# Patient Record
Sex: Female | Born: 1935 | Race: Black or African American | Hispanic: No | Marital: Married | Smoking: Never smoker
Health system: Southern US, Community
[De-identification: ages and names within clinical notes are randomized; demographics above are authoritative.]

## PROBLEM LIST (undated history)

## (undated) DIAGNOSIS — I2510 Atherosclerotic heart disease of native coronary artery without angina pectoris: Secondary | ICD-10-CM

## (undated) DIAGNOSIS — Z9289 Personal history of other medical treatment: Secondary | ICD-10-CM

## (undated) DIAGNOSIS — R0602 Shortness of breath: Secondary | ICD-10-CM

## (undated) DIAGNOSIS — I10 Essential (primary) hypertension: Secondary | ICD-10-CM

## (undated) DIAGNOSIS — E119 Type 2 diabetes mellitus without complications: Secondary | ICD-10-CM

## (undated) DIAGNOSIS — N183 Chronic kidney disease, stage 3 unspecified: Secondary | ICD-10-CM

## (undated) DIAGNOSIS — I209 Angina pectoris, unspecified: Secondary | ICD-10-CM

## (undated) DIAGNOSIS — N179 Acute kidney failure, unspecified: Secondary | ICD-10-CM

## (undated) DIAGNOSIS — J90 Pleural effusion, not elsewhere classified: Secondary | ICD-10-CM

## (undated) DIAGNOSIS — I639 Cerebral infarction, unspecified: Secondary | ICD-10-CM

## (undated) HISTORY — PX: CATARACT EXTRACTION W/ INTRAOCULAR LENS  IMPLANT, BILATERAL: SHX1307

## (undated) HISTORY — DX: Pleural effusion, not elsewhere classified: J90

## (undated) HISTORY — DX: Atherosclerotic heart disease of native coronary artery without angina pectoris: I25.10

## (undated) HISTORY — DX: Essential (primary) hypertension: I10

## (undated) HISTORY — DX: Shortness of breath: R06.02

## (undated) SURGERY — Surgical Case
Anesthesia: *Unknown

---

## 1997-06-26 ENCOUNTER — Other Ambulatory Visit: Admission: RE | Admit: 1997-06-26 | Discharge: 1997-06-26 | Payer: Self-pay | Admitting: Internal Medicine

## 1997-07-17 ENCOUNTER — Encounter: Admission: RE | Admit: 1997-07-17 | Discharge: 1997-10-15 | Payer: Self-pay | Admitting: Internal Medicine

## 2002-01-31 HISTORY — PX: CORONARY ANGIOPLASTY WITH STENT PLACEMENT: SHX49

## 2003-01-02 ENCOUNTER — Inpatient Hospital Stay (HOSPITAL_COMMUNITY): Admission: EM | Admit: 2003-01-02 | Discharge: 2003-01-06 | Payer: Self-pay | Admitting: Emergency Medicine

## 2003-02-03 ENCOUNTER — Encounter (HOSPITAL_COMMUNITY): Admission: RE | Admit: 2003-02-03 | Discharge: 2003-05-04 | Payer: Self-pay | Admitting: Cardiology

## 2003-05-05 ENCOUNTER — Encounter (HOSPITAL_COMMUNITY): Admission: RE | Admit: 2003-05-05 | Discharge: 2003-05-11 | Payer: Self-pay | Admitting: Cardiology

## 2003-05-12 ENCOUNTER — Encounter (HOSPITAL_COMMUNITY): Admission: RE | Admit: 2003-05-12 | Discharge: 2003-08-10 | Payer: Self-pay | Admitting: Cardiology

## 2003-09-02 ENCOUNTER — Encounter (HOSPITAL_COMMUNITY): Admission: RE | Admit: 2003-09-02 | Discharge: 2003-12-01 | Payer: Self-pay | Admitting: Cardiology

## 2003-12-02 ENCOUNTER — Encounter (HOSPITAL_COMMUNITY): Admission: RE | Admit: 2003-12-02 | Discharge: 2004-03-01 | Payer: Self-pay | Admitting: Cardiology

## 2004-03-03 ENCOUNTER — Encounter (HOSPITAL_COMMUNITY): Admission: RE | Admit: 2004-03-03 | Discharge: 2004-06-01 | Payer: Self-pay | Admitting: Cardiology

## 2004-03-08 ENCOUNTER — Ambulatory Visit: Payer: Self-pay | Admitting: Cardiology

## 2004-06-02 ENCOUNTER — Encounter (HOSPITAL_COMMUNITY): Admission: RE | Admit: 2004-06-02 | Discharge: 2004-08-31 | Payer: Self-pay | Admitting: Cardiology

## 2004-09-01 ENCOUNTER — Encounter (HOSPITAL_COMMUNITY): Admission: RE | Admit: 2004-09-01 | Discharge: 2004-11-30 | Payer: Self-pay | Admitting: Cardiology

## 2004-12-01 ENCOUNTER — Encounter (HOSPITAL_COMMUNITY): Admission: RE | Admit: 2004-12-01 | Discharge: 2005-03-01 | Payer: Self-pay | Admitting: Cardiology

## 2005-03-03 ENCOUNTER — Encounter (HOSPITAL_COMMUNITY): Admission: RE | Admit: 2005-03-03 | Discharge: 2005-06-01 | Payer: Self-pay | Admitting: Cardiology

## 2005-05-25 ENCOUNTER — Ambulatory Visit: Payer: Self-pay | Admitting: Cardiology

## 2005-06-02 ENCOUNTER — Encounter (HOSPITAL_COMMUNITY): Admission: RE | Admit: 2005-06-02 | Discharge: 2005-08-31 | Payer: Self-pay | Admitting: Cardiology

## 2005-09-01 ENCOUNTER — Encounter (HOSPITAL_COMMUNITY): Admission: RE | Admit: 2005-09-01 | Discharge: 2005-11-30 | Payer: Self-pay | Admitting: Cardiology

## 2005-12-01 ENCOUNTER — Encounter (HOSPITAL_COMMUNITY): Admission: RE | Admit: 2005-12-01 | Discharge: 2006-03-01 | Payer: Self-pay | Admitting: Cardiology

## 2006-01-13 ENCOUNTER — Ambulatory Visit: Payer: Self-pay | Admitting: Cardiology

## 2006-03-03 ENCOUNTER — Encounter (HOSPITAL_COMMUNITY): Admission: RE | Admit: 2006-03-03 | Discharge: 2006-06-01 | Payer: Self-pay | Admitting: Cardiology

## 2006-06-02 ENCOUNTER — Encounter (HOSPITAL_COMMUNITY): Admission: RE | Admit: 2006-06-02 | Discharge: 2006-08-31 | Payer: Self-pay | Admitting: Cardiology

## 2006-09-01 ENCOUNTER — Encounter (HOSPITAL_COMMUNITY): Admission: RE | Admit: 2006-09-01 | Discharge: 2006-11-30 | Payer: Self-pay | Admitting: Cardiology

## 2006-12-02 ENCOUNTER — Encounter (HOSPITAL_COMMUNITY): Admission: RE | Admit: 2006-12-02 | Discharge: 2007-01-30 | Payer: Self-pay | Admitting: Internal Medicine

## 2007-01-16 ENCOUNTER — Ambulatory Visit: Payer: Self-pay | Admitting: Cardiology

## 2007-01-31 ENCOUNTER — Ambulatory Visit: Payer: Self-pay | Admitting: Cardiology

## 2007-01-31 LAB — CONVERTED CEMR LAB
BUN: 15 mg/dL (ref 6–23)
CO2: 28 meq/L (ref 19–32)
Calcium: 9.8 mg/dL (ref 8.4–10.5)
Chloride: 104 meq/L (ref 96–112)
Creatinine, Ser: 0.9 mg/dL (ref 0.4–1.2)
GFR calc Af Amer: 78 mL/min
GFR calc non Af Amer: 65 mL/min
Glucose, Bld: 337 mg/dL — ABNORMAL HIGH (ref 70–99)
Potassium: 4.1 meq/L (ref 3.5–5.1)
Sodium: 140 meq/L (ref 135–145)

## 2007-02-01 ENCOUNTER — Encounter (HOSPITAL_COMMUNITY): Admission: RE | Admit: 2007-02-01 | Discharge: 2007-04-03 | Payer: Self-pay | Admitting: Cardiology

## 2008-01-02 ENCOUNTER — Ambulatory Visit: Payer: Self-pay | Admitting: Cardiology

## 2009-01-05 DIAGNOSIS — I10 Essential (primary) hypertension: Secondary | ICD-10-CM | POA: Insufficient documentation

## 2009-01-05 DIAGNOSIS — E119 Type 2 diabetes mellitus without complications: Secondary | ICD-10-CM

## 2009-01-05 DIAGNOSIS — I2510 Atherosclerotic heart disease of native coronary artery without angina pectoris: Secondary | ICD-10-CM

## 2009-01-06 ENCOUNTER — Ambulatory Visit: Payer: Self-pay | Admitting: Cardiology

## 2009-01-06 DIAGNOSIS — E785 Hyperlipidemia, unspecified: Secondary | ICD-10-CM

## 2009-01-19 ENCOUNTER — Ambulatory Visit: Payer: Self-pay | Admitting: Cardiology

## 2009-01-19 DIAGNOSIS — I2510 Atherosclerotic heart disease of native coronary artery without angina pectoris: Secondary | ICD-10-CM

## 2009-01-20 LAB — CONVERTED CEMR LAB
BUN: 18 mg/dL (ref 6–23)
CO2: 26 meq/L (ref 19–32)
Calcium: 9.9 mg/dL (ref 8.4–10.5)
Chloride: 104 meq/L (ref 96–112)
Creatinine, Ser: 1 mg/dL (ref 0.4–1.2)
GFR calc non Af Amer: 68.73 mL/min (ref >60)
Glucose, Bld: 286 mg/dL — ABNORMAL HIGH (ref 70–99)
Potassium: 4.6 meq/L (ref 3.5–5.1)
Sodium: 138 meq/L (ref 135–145)

## 2009-01-31 DIAGNOSIS — J90 Pleural effusion, not elsewhere classified: Secondary | ICD-10-CM

## 2009-01-31 HISTORY — PX: CORONARY ARTERY BYPASS GRAFT: SHX141

## 2009-01-31 HISTORY — DX: Pleural effusion, not elsewhere classified: J90

## 2009-12-02 ENCOUNTER — Inpatient Hospital Stay (HOSPITAL_COMMUNITY): Admission: EM | Admit: 2009-12-02 | Discharge: 2009-12-18 | Payer: Self-pay | Admitting: Emergency Medicine

## 2009-12-02 ENCOUNTER — Ambulatory Visit: Payer: Self-pay | Admitting: Cardiology

## 2009-12-03 ENCOUNTER — Ambulatory Visit: Payer: Self-pay | Admitting: Thoracic Surgery (Cardiothoracic Vascular Surgery)

## 2009-12-04 ENCOUNTER — Encounter: Payer: Self-pay | Admitting: Thoracic Surgery (Cardiothoracic Vascular Surgery)

## 2009-12-16 ENCOUNTER — Encounter: Payer: Self-pay | Admitting: Thoracic Surgery (Cardiothoracic Vascular Surgery)

## 2009-12-16 ENCOUNTER — Ambulatory Visit: Payer: Self-pay | Admitting: Vascular Surgery

## 2009-12-29 ENCOUNTER — Encounter: Payer: Self-pay | Admitting: Cardiology

## 2009-12-30 ENCOUNTER — Telehealth: Payer: Self-pay | Admitting: Cardiology

## 2009-12-30 ENCOUNTER — Encounter: Payer: Self-pay | Admitting: Cardiology

## 2009-12-31 ENCOUNTER — Encounter: Payer: Self-pay | Admitting: Cardiology

## 2009-12-31 ENCOUNTER — Telehealth (INDEPENDENT_AMBULATORY_CARE_PROVIDER_SITE_OTHER): Payer: Self-pay | Admitting: *Deleted

## 2010-01-01 ENCOUNTER — Telehealth: Payer: Self-pay | Admitting: Cardiology

## 2010-01-04 ENCOUNTER — Encounter: Payer: Self-pay | Admitting: Cardiology

## 2010-01-04 ENCOUNTER — Encounter: Admission: RE | Admit: 2010-01-04 | Discharge: 2010-01-04 | Payer: Self-pay | Admitting: Cardiothoracic Surgery

## 2010-01-04 ENCOUNTER — Ambulatory Visit: Payer: Self-pay | Admitting: Cardiothoracic Surgery

## 2010-01-18 ENCOUNTER — Encounter
Admission: RE | Admit: 2010-01-18 | Discharge: 2010-01-18 | Payer: Self-pay | Source: Home / Self Care | Attending: Thoracic Surgery (Cardiothoracic Vascular Surgery) | Admitting: Thoracic Surgery (Cardiothoracic Vascular Surgery)

## 2010-01-18 ENCOUNTER — Ambulatory Visit: Payer: Self-pay | Admitting: Thoracic Surgery (Cardiothoracic Vascular Surgery)

## 2010-01-18 ENCOUNTER — Encounter: Payer: Self-pay | Admitting: Cardiology

## 2010-01-26 ENCOUNTER — Encounter: Payer: Self-pay | Admitting: Cardiology

## 2010-01-26 ENCOUNTER — Ambulatory Visit: Payer: Self-pay | Admitting: Cardiology

## 2010-02-08 ENCOUNTER — Encounter: Payer: Self-pay | Admitting: Cardiology

## 2010-03-02 NOTE — Progress Notes (Addendum)
Summary: fax number   Phone Note Call from Patient   Reason for Call: Talk to Nurse Summary of Call: fax# [phone] Attn:Caryn Teamer She is sitting at fax machine waiting Initial call taken by: Shelda Pal,  January 01, 2010 3:58 PM  Follow-up for Phone Call        faxed letter as requested Follow-up by: Sim Boast, RN,  January 01, 2010 4:00 PM

## 2010-03-02 NOTE — Progress Notes (Addendum)
Summary: [facility]   Phone Note Call from Patient Call back at [phone]   Caller: DR.Vandeventer Summary of Call: [place] Initial call taken by: Delsa Sale,  December 30, 2009 11:47 AM  Follow-up for Phone Call        Would like a note to whom it may concern that [name] is the care taker of  this pt.  This will be supplied once I speak with [name] to verify exactly what the letter should say. Follow-up by: Sim Boast, RN,  December 30, 2009 1:42 PM

## 2010-03-02 NOTE — Miscellaneous (Addendum)
  Clinical Lists Changes  Observations: Added new observation of CARDCATHFIND: CONCLUSION:  Severe 3-vessel coronary artery disease with 70% proximal and 90% mid stenosis in the LAD, 90% stenosis in the mid circumflex artery with 80% stenosis in the first marginal and 95% stenosis in the second marginal branch, and total occlusion of the mid-to-distal right coronary with inferior wall hypokinesis and estimated ejection fraction of 40%.  RECOMMENDATIONS:  The patient has severe 3-vessel disease which has progressed in the circumflex and right coronary artery lesions.  I suspect the culprit for this recent non-ST-elevation MI is the second marginal branch.  I think surgery is the best option for revascularization.  Her major comorbidity is her age but she has good renal function and pretty good target vessels.  We will obtain surgical consultation. (12/03/2009 10:26) Added new observation of CT SCAN: Findings:  Both lungs are clear.  There is no axillary,   mediastinal, hilar adenopathy.  There are no filling defects within   either pulmonary arterial system to suggest a segmental or   subsegmental pulmonary embolus.  The thoracic aorta has a normal   caliber and appearance except for a small amount of atherosclerotic   plaque in the transverse aorta.    There is a moderate sized hiatal hernia.  Visualized upper abdomen   is unremarkable.  There is no evidence of acute osseous   abnormality.    Review of the MIP images confirms the above findings.    IMPRESSION:   Moderate sized hiatal hernia.  The exam is otherwise negative.  (12/02/2009 10:27)      Cardiac Cath  Procedure date:  12/03/2009  Findings:      CONCLUSION:  Severe 3-vessel coronary artery disease with 70% proximal and 90% mid stenosis in the LAD, 90% stenosis in the mid circumflex artery with 80% stenosis in the first marginal and 95% stenosis in the second marginal branch, and total occlusion of the  mid-to-distal right coronary with inferior wall hypokinesis and estimated ejection fraction of 40%.  RECOMMENDATIONS:  The patient has severe 3-vessel disease which has progressed in the circumflex and right coronary artery lesions.  I suspect the culprit for this recent non-ST-elevation MI is the second marginal branch.  I think surgery is the best option for revascularization.  Her major comorbidity is her age but she has good renal function and pretty good target vessels.  We will obtain surgical consultation.  CT Scan  Procedure date:  12/02/2009  Findings:      Findings:  Both lungs are clear.  There is no axillary,   mediastinal, hilar adenopathy.  There are no filling defects within   either pulmonary arterial system to suggest a segmental or   subsegmental pulmonary embolus.  The thoracic aorta has a normal   caliber and appearance except for a small amount of atherosclerotic   plaque in the transverse aorta.    There is a moderate sized hiatal hernia.  Visualized upper abdomen   is unremarkable.  There is no evidence of acute osseous   abnormality.    Review of the MIP images confirms the above findings.    IMPRESSION:   Moderate sized hiatal hernia.  The exam is otherwise negative.

## 2010-03-02 NOTE — Progress Notes (Addendum)
   Walk in Patient Form Recieved " pt Daughter needs note for work" sent to [place]  December 31, 2009 8:38 AM

## 2010-03-02 NOTE — Letter (Addendum)
Summary: letterfor daughter as caregiver  Press photographer, [place]  [address] [place]   [place], [place] [address]   Phone: [phone]  Fax: [phone]          December 31, 2009 MRN: [id number]    RE:   [name]     To Whom It May Concern:   Caryn Teamer is the caregiver for her mother.  Her condition started on 12/03/09.  Her dates of incapacity are 12/08/09 through 02/11/10.  As a caregiver she will provide transportation to various doctors appointments and cardiac rehab.  She will also provide basic hygiene, nutritional and physical support, pulmonary/respiratory care and assistance with ambulation until the patient becomes independent.        Sincerely,      Sim Boast, RN for Dr. Minus Breeding  This letter has been electronically signed by your physician.

## 2010-03-04 NOTE — Letter (Addendum)
Summary: [facility] Office Visit Note   [facility] Office Visit Note   Imported By: Sallee Provencal 01/22/2010 11:10:20  _____________________________________________________________________  External Attachment:    Type:   Image     Comment:   External Document

## 2010-03-04 NOTE — Assessment & Plan Note (Addendum)
Summary: 4 month rov.sl  Medications Added AMLODIPINE BESYLATE 10 MG TABS (AMLODIPINE BESYLATE) 1 by mouth daily ASPIRIN 81 MG TABS (ASPIRIN) one daily GLIPIZIDE 5 MG TABS (GLIPIZIDE) 1 by mouth qam LOSARTAN POTASSIUM 100 MG TABS (LOSARTAN POTASSIUM) 1 by mouth daily PRILOSEC 20 MG CPDR (OMEPRAZOLE)  FUROSEMIDE 40 MG TABS (FUROSEMIDE) one daily ULTRAM 50 MG TABS (TRAMADOL HCL) as directed ALPRAZOLAM 0.25 MG TABS (ALPRAZOLAM) as directed      Allergies Added: NKDA  Visit Type:  Follow-up Primary Provider:  Dr. Deland Pretty  CC:  CAD.  History of Present Illness: The patient presents for followup of coronary disease status post CABG. She has done relatively well since her surgery. She did have a somewhat slow recovery and had to go for rehabilitation for a while. She is doing physical therapy at home for now. She think she is improving steadily. She denies any chest discomfort similar to her presenting complaints. She has had no chest pressure, neck or arm discomfort. She has had no palpitations, presyncope or syncope.  he has had no swallowing, weight gain or edema. She had no cough fevers or chills. Of note she did have a significant rash after surgery. This is improved. She's had medications changed by Dr. Ubaldo Glassing.  It is not clear to media pending aging though I do note she was taken off of ACE inhibitors.  Current Medications (verified): 1)  Amlodipine Besylate 10 Mg Tabs (Amlodipine Besylate) .Marland Kitchen.. 1 By Mouth Daily 2)  Lopressor 100 Mg Tabs (Metoprolol Tartrate) .Marland Kitchen.. 1 By Mouth Two Times A Day 3)  Aspirin 325 Mg  Tabs (Aspirin) .Marland Kitchen.. 1 By Mouth Daily 4)  Glucophage 1000 Mg Tabs (Metformin Hcl) .Marland Kitchen.. 1 By Mouth Two Times A Day 5)  Glipizide 5 Mg Tabs (Glipizide) .Marland Kitchen.. 1 By Mouth Qam 6)  Lisinopril 10 Mg Tabs (Lisinopril) .... One Daily 7)  Losartan Potassium 100 Mg Tabs (Losartan Potassium) .Marland Kitchen.. 1 By Mouth Daily  Allergies (verified): No Known Drug Allergies  Past History:  Past  Medical History:  1. Coronary artery disease (Stenting of RCA, CABG)     stented).  2. Mildly reduced EF (45%).   3. Diabetes mellitus.   4. Hypertension.   Past Surgical History: Bilateral cataract surgery CABG ( x5 (left internal mammary artery to  LAD, saphenous vein graft to second diagonal, saphenous vein       graft to obtuse marginal 1, saphenous vein graft to posterior  descending).  Review of Systems       As stated in the HPI and negative for all other systems.   Vital Signs:  Patient profile:   77 year old female Height:      64 inches Weight:      158 pounds BMI:     27.22 Pulse rate:   86 / minute Resp:     16 per minute BP sitting:   152 / 78  (right arm)  Vitals Entered By: Levora Angel, CNA (January 26, 2010 4:05 PM)  Physical Exam  General:  Well developed, well nourished, in no acute distress. Head:  normocephalic and atraumatic Eyes:  PERRLA/EOM intact; conjunctiva and lids normal. Neck:  Neck supple, no JVD. No masses, thyromegaly or abnormal cervical nodes. Chest Wall:  Well-healed sternotomy scar without erythema or exudate Lungs:  Clear bilaterally to auscultation and percussion. Abdomen:  Bowel sounds positive; abdomen soft and non-tender without masses, organomegaly, or hernias noted. No hepatosplenomegaly. Msk:  Back normal, normal gait. Muscle strength and  tone normal. Extremities:  No clubbing or cyanosis. Neurologic:  Alert and oriented x 3. Skin:  Intact without lesions or rashes. Cervical Nodes:  no significant adenopathy Inguinal Nodes:  no significant adenopathy Psych:  Normal affect.   Detailed Cardiovascular Exam  Neck    Carotids: Carotids full and equal bilaterally without bruits.      Neck Veins: Normal, no JVD.    Heart    Inspection: no deformities or lifts noted.      Palpation: normal PMI with no thrills palpable.      Auscultation: regular rate and rhythm, S1, S2 without murmurs, rubs, gallops, or clicks.     Vascular    Abdominal Aorta: no palpable masses, pulsations, or audible bruits.      Femoral Pulses: normal femoral pulses bilaterally.      Pedal Pulses: normal pedal pulses bilaterally.      Radial Pulses: normal radial pulses bilaterally.      Peripheral Circulation: no clubbing, cyanosis, or edema noted with normal capillary refill.     EKG  Procedure date:  01/26/2010  Findings:      Sinus rhythm, rate 88, axis within normal limits, old inferior infarct, lateral infarct, lateral T wave inversions  Impression & Recommendations:  Problem # 1:  CORONARY ATHEROSCLEROSIS NATIVE CORONARY ARTERY (ICD-414.01) The patient is making a slow but steady recovery from surgery. I will wait until the end of the month to suggest cardiac rehabilitation. For now I will continue with meds as listed.  Problem # 2:  HYPERTENSION (ICD-401.9) Her blood pressure is slightly elevated in the office today. However, she reports that it is well controlled typically elsewhere. I will make that change her regimen.  Problem # 3:  DYSLIPIDEMIA (ICD-272.4) We will follow this closely with a goal LDL less than 70 and HDL greater than 50.  Other Orders: EKG w/ Interpretation (93000)  Patient Instructions: 1)  Your physician recommends that you schedule a follow-up appointment in: 2 months with Dr Percival Spanish 2)  Your physician has recommended you make the following change in your medication: decrease Asa to 81 mg once a day

## 2010-03-04 NOTE — Letter (Addendum)
Summary: [facility] - Walk-In Patient Form  [facility] - Walk-In Patient Form   Imported By: Marilynne Drivers 02/08/2010 14:46:30  _____________________________________________________________________  External Attachment:    Type:   Image     Comment:   External Document

## 2010-03-04 NOTE — Letter (Addendum)
Summary: [facility]  [facility]   Imported By: Marilynne Drivers 02/04/2010 15:08:53  _____________________________________________________________________  External Attachment:    Type:   Image     Comment:   External Document

## 2010-03-04 NOTE — Miscellaneous (Signed)
Summary: [facility] Physician Order/Treatment Plan   [facility] Physician Order/Treatment Plan   Imported By: Sallee Provencal 02/22/2010 15:44:07  _____________________________________________________________________  External Attachment:    Type:   Image     Comment:   External Document

## 2010-03-05 ENCOUNTER — Encounter: Payer: Self-pay | Admitting: Cardiology

## 2010-03-11 ENCOUNTER — Telehealth (INDEPENDENT_AMBULATORY_CARE_PROVIDER_SITE_OTHER): Payer: Self-pay | Admitting: *Deleted

## 2010-03-15 ENCOUNTER — Encounter (HOSPITAL_COMMUNITY): Payer: Medicare Other | Attending: Cardiology

## 2010-03-15 ENCOUNTER — Other Ambulatory Visit: Payer: Self-pay

## 2010-03-15 DIAGNOSIS — E785 Hyperlipidemia, unspecified: Secondary | ICD-10-CM | POA: Insufficient documentation

## 2010-03-15 DIAGNOSIS — Z5189 Encounter for other specified aftercare: Secondary | ICD-10-CM | POA: Insufficient documentation

## 2010-03-15 DIAGNOSIS — I10 Essential (primary) hypertension: Secondary | ICD-10-CM | POA: Insufficient documentation

## 2010-03-15 DIAGNOSIS — I2510 Atherosclerotic heart disease of native coronary artery without angina pectoris: Secondary | ICD-10-CM | POA: Insufficient documentation

## 2010-03-15 DIAGNOSIS — E119 Type 2 diabetes mellitus without complications: Secondary | ICD-10-CM | POA: Insufficient documentation

## 2010-03-15 LAB — GLUCOSE, CAPILLARY
Glucose-Capillary: 293 mg/dL — ABNORMAL HIGH (ref 70–99)
Glucose-Capillary: 274 mg/dL — ABNORMAL HIGH (ref 70–99)

## 2010-03-17 ENCOUNTER — Encounter (HOSPITAL_COMMUNITY): Payer: Medicare Other

## 2010-03-17 ENCOUNTER — Other Ambulatory Visit: Payer: Self-pay

## 2010-03-17 ENCOUNTER — Encounter (INDEPENDENT_AMBULATORY_CARE_PROVIDER_SITE_OTHER): Payer: Self-pay | Admitting: *Deleted

## 2010-03-17 LAB — GLUCOSE, CAPILLARY
Glucose-Capillary: 186 mg/dL — ABNORMAL HIGH (ref 70–99)
Glucose-Capillary: 166 mg/dL — ABNORMAL HIGH (ref 70–99)

## 2010-03-18 NOTE — Progress Notes (Addendum)
Summary: Records Request   Faxed OV & EKG to [name] ([id number]).  [name]  March 11, 2010 12:25 PM

## 2010-03-19 ENCOUNTER — Other Ambulatory Visit: Payer: Self-pay

## 2010-03-19 ENCOUNTER — Encounter (HOSPITAL_COMMUNITY): Payer: Medicare Other

## 2010-03-22 ENCOUNTER — Encounter (HOSPITAL_COMMUNITY): Payer: Medicare Other

## 2010-03-22 LAB — GLUCOSE, CAPILLARY
Glucose-Capillary: 261 mg/dL — ABNORMAL HIGH (ref 70–99)
Glucose-Capillary: 203 mg/dL — ABNORMAL HIGH (ref 70–99)

## 2010-03-24 ENCOUNTER — Other Ambulatory Visit: Payer: Self-pay

## 2010-03-24 ENCOUNTER — Encounter (HOSPITAL_COMMUNITY): Payer: Medicare Other

## 2010-03-24 LAB — GLUCOSE, CAPILLARY
Glucose-Capillary: 204 mg/dL — ABNORMAL HIGH (ref 70–99)
Glucose-Capillary: 172 mg/dL — ABNORMAL HIGH (ref 70–99)

## 2010-03-24 NOTE — Letter (Signed)
Summary: Appointment - [place], [place]  [address] [place]   [place], [place] [address]   Phone: [phone]  Fax: [phone]     March 17, 2010 MRN: [id number]   [place] [address] [place], [place]  [address]   Dear [name],   Due to a change in our office schedule, your appointment on  03-30-10  at   10:00am.             must be changed.  It is very important that we reach you to reschedule this appointment. We look forward to participating in your health care needs. Please contact us at the number listed above at your earliest convenience to reschedule this appointment.     Sincerely,  Public relations account executive

## 2010-03-26 ENCOUNTER — Other Ambulatory Visit: Payer: Self-pay

## 2010-03-26 ENCOUNTER — Encounter: Payer: Self-pay | Admitting: Cardiology

## 2010-03-26 ENCOUNTER — Encounter (HOSPITAL_COMMUNITY): Payer: Medicare Other

## 2010-03-29 ENCOUNTER — Encounter (HOSPITAL_COMMUNITY): Payer: Medicare Other

## 2010-03-30 ENCOUNTER — Ambulatory Visit: Payer: Self-pay | Admitting: Cardiology

## 2010-03-31 ENCOUNTER — Encounter (HOSPITAL_COMMUNITY): Payer: Medicare Other

## 2010-04-02 ENCOUNTER — Encounter (HOSPITAL_COMMUNITY): Payer: Medicare Other

## 2010-04-05 ENCOUNTER — Encounter (HOSPITAL_COMMUNITY): Payer: Medicare Other | Attending: Cardiology

## 2010-04-05 ENCOUNTER — Other Ambulatory Visit: Payer: Self-pay

## 2010-04-05 DIAGNOSIS — I10 Essential (primary) hypertension: Secondary | ICD-10-CM | POA: Insufficient documentation

## 2010-04-05 DIAGNOSIS — Z5189 Encounter for other specified aftercare: Secondary | ICD-10-CM | POA: Insufficient documentation

## 2010-04-05 DIAGNOSIS — E785 Hyperlipidemia, unspecified: Secondary | ICD-10-CM | POA: Insufficient documentation

## 2010-04-05 DIAGNOSIS — I2510 Atherosclerotic heart disease of native coronary artery without angina pectoris: Secondary | ICD-10-CM | POA: Insufficient documentation

## 2010-04-05 DIAGNOSIS — E119 Type 2 diabetes mellitus without complications: Secondary | ICD-10-CM | POA: Insufficient documentation

## 2010-04-07 ENCOUNTER — Encounter (HOSPITAL_COMMUNITY): Payer: Medicare Other

## 2010-04-07 ENCOUNTER — Other Ambulatory Visit: Payer: Self-pay

## 2010-04-09 ENCOUNTER — Other Ambulatory Visit: Payer: Self-pay

## 2010-04-09 ENCOUNTER — Encounter (HOSPITAL_COMMUNITY): Payer: Medicare Other

## 2010-04-09 LAB — GLUCOSE, CAPILLARY
Glucose-Capillary: 177 mg/dL — ABNORMAL HIGH (ref 70–99)
Glucose-Capillary: 179 mg/dL — ABNORMAL HIGH (ref 70–99)

## 2010-04-12 ENCOUNTER — Encounter (HOSPITAL_COMMUNITY): Payer: Medicare Other

## 2010-04-13 LAB — BASIC METABOLIC PANEL
BUN: 11 mg/dL (ref 6–23)
BUN: 11 mg/dL (ref 6–23)
BUN: 14 mg/dL (ref 6–23)
BUN: 17 mg/dL (ref 6–23)
CO2: 22 mEq/L (ref 19–32)
CO2: 26 mEq/L (ref 19–32)
CO2: 27 mEq/L (ref 19–32)
CO2: 27 mEq/L (ref 19–32)
CO2: 27 mEq/L (ref 19–32)
Calcium: 9 mg/dL (ref 8.4–10.5)
Calcium: 9.3 mg/dL (ref 8.4–10.5)
Calcium: 9.5 mg/dL (ref 8.4–10.5)
Calcium: 9.5 mg/dL (ref 8.4–10.5)
Chloride: 102 mEq/L (ref 96–112)
Chloride: 105 mEq/L (ref 96–112)
Chloride: 106 mEq/L (ref 96–112)
Chloride: 109 mEq/L (ref 96–112)
Chloride: 109 mEq/L (ref 96–112)
Chloride: 113 mEq/L — ABNORMAL HIGH (ref 96–112)
Chloride: 118 mEq/L — ABNORMAL HIGH (ref 96–112)
Creatinine, Ser: 0.92 mg/dL (ref 0.4–1.2)
Creatinine, Ser: 0.96 mg/dL (ref 0.4–1.2)
Creatinine, Ser: 1.03 mg/dL (ref 0.4–1.2)
Creatinine, Ser: 1.04 mg/dL (ref 0.4–1.2)
Creatinine, Ser: 1.08 mg/dL (ref 0.4–1.2)
Creatinine, Ser: 1.13 mg/dL (ref 0.4–1.2)
GFR calc Af Amer: 55 mL/min — ABNORMAL LOW (ref >60)
GFR calc Af Amer: 56 mL/min — ABNORMAL LOW (ref >60)
GFR calc Af Amer: >60 mL/min (ref >60)
GFR calc Af Amer: >60 mL/min (ref >60)
GFR calc Af Amer: >60 mL/min (ref >60)
GFR calc non Af Amer: 45 mL/min — ABNORMAL LOW (ref >60)
GFR calc non Af Amer: 53 mL/min — ABNORMAL LOW (ref >60)
GFR calc non Af Amer: 55 mL/min — ABNORMAL LOW (ref >60)
GFR calc non Af Amer: 56 mL/min — ABNORMAL LOW (ref >60)
GFR calc non Af Amer: 56 mL/min — ABNORMAL LOW (ref >60)
Glucose, Bld: 122 mg/dL — ABNORMAL HIGH (ref 70–99)
Glucose, Bld: 142 mg/dL — ABNORMAL HIGH (ref 70–99)
Glucose, Bld: 172 mg/dL — ABNORMAL HIGH (ref 70–99)
Glucose, Bld: 338 mg/dL — ABNORMAL HIGH (ref 70–99)
Glucose, Bld: 50 mg/dL — ABNORMAL LOW (ref 70–99)
Glucose, Bld: 86 mg/dL (ref 70–99)
Potassium: 3.7 mEq/L (ref 3.5–5.1)
Potassium: 3.8 mEq/L (ref 3.5–5.1)
Potassium: 3.8 mEq/L (ref 3.5–5.1)
Potassium: 4 mEq/L (ref 3.5–5.1)
Potassium: 4.1 mEq/L (ref 3.5–5.1)
Sodium: 138 mEq/L (ref 135–145)
Sodium: 139 mEq/L (ref 135–145)
Sodium: 140 mEq/L (ref 135–145)
Sodium: 140 mEq/L (ref 135–145)
Sodium: 144 mEq/L (ref 135–145)

## 2010-04-13 LAB — CBC
HCT: 29.2 % — ABNORMAL LOW (ref 36.0–46.0)
HCT: 29.3 % — ABNORMAL LOW (ref 36.0–46.0)
HCT: 30.1 % — ABNORMAL LOW (ref 36.0–46.0)
HCT: 31.5 % — ABNORMAL LOW (ref 36.0–46.0)
HCT: 32.4 % — ABNORMAL LOW (ref 36.0–46.0)
HCT: 36.7 % (ref 36.0–46.0)
HCT: 37.1 % (ref 36.0–46.0)
HCT: 40.2 % (ref 36.0–46.0)
Hemoglobin: 10.1 g/dL — ABNORMAL LOW (ref 12.0–15.0)
Hemoglobin: 10.4 g/dL — ABNORMAL LOW (ref 12.0–15.0)
Hemoglobin: 10.4 g/dL — ABNORMAL LOW (ref 12.0–15.0)
Hemoglobin: 10.5 g/dL — ABNORMAL LOW (ref 12.0–15.0)
Hemoglobin: 11.9 g/dL — ABNORMAL LOW (ref 12.0–15.0)
Hemoglobin: 12 g/dL (ref 12.0–15.0)
Hemoglobin: 12.2 g/dL (ref 12.0–15.0)
Hemoglobin: 13.5 g/dL (ref 12.0–15.0)
Hemoglobin: 9.6 g/dL — ABNORMAL LOW (ref 12.0–15.0)
Hemoglobin: 9.7 g/dL — ABNORMAL LOW (ref 12.0–15.0)
MCH: 28 pg (ref 26.0–34.0)
MCH: 29.1 pg (ref 26.0–34.0)
MCH: 29.5 pg (ref 26.0–34.0)
MCH: 29.7 pg (ref 26.0–34.0)
MCH: 29.8 pg (ref 26.0–34.0)
MCH: 29.9 pg (ref 26.0–34.0)
MCH: 30 pg (ref 26.0–34.0)
MCH: 30.1 pg (ref 26.0–34.0)
MCH: 30.3 pg (ref 26.0–34.0)
MCHC: 32.3 g/dL (ref 30.0–36.0)
MCHC: 32.4 g/dL (ref 30.0–36.0)
MCHC: 33.1 g/dL (ref 30.0–36.0)
MCHC: 33.1 g/dL (ref 30.0–36.0)
MCHC: 33.3 g/dL (ref 30.0–36.0)
MCHC: 33.6 g/dL (ref 30.0–36.0)
MCV: 88 fL (ref 78.0–100.0)
MCV: 89.1 fL (ref 78.0–100.0)
MCV: 90.3 fL (ref 78.0–100.0)
MCV: 91.3 fL (ref 78.0–100.0)
MCV: 92.9 fL (ref 78.0–100.0)
Platelets: 101 10*3/uL — ABNORMAL LOW (ref 150–400)
Platelets: 130 10*3/uL — ABNORMAL LOW (ref 150–400)
Platelets: 130 10*3/uL — ABNORMAL LOW (ref 150–400)
Platelets: 155 10*3/uL (ref 150–400)
Platelets: 212 10*3/uL (ref 150–400)
Platelets: 234 10*3/uL (ref 150–400)
Platelets: 239 10*3/uL (ref 150–400)
Platelets: 250 10*3/uL (ref 150–400)
Platelets: 262 10*3/uL (ref 150–400)
RBC: 3.33 MIL/uL — ABNORMAL LOW (ref 3.87–5.11)
RBC: 3.38 MIL/uL — ABNORMAL LOW (ref 3.87–5.11)
RBC: 3.47 MIL/uL — ABNORMAL LOW (ref 3.87–5.11)
RBC: 3.53 MIL/uL — ABNORMAL LOW (ref 3.87–5.11)
RBC: 3.54 MIL/uL — ABNORMAL LOW (ref 3.87–5.11)
RBC: 4.04 MIL/uL (ref 3.87–5.11)
RBC: 4.09 MIL/uL (ref 3.87–5.11)
RBC: 4.14 MIL/uL (ref 3.87–5.11)
RBC: 4.25 MIL/uL (ref 3.87–5.11)
RDW: 13.7 % (ref 11.5–15.5)
RDW: 14.2 % (ref 11.5–15.5)
RDW: 14.2 % (ref 11.5–15.5)
RDW: 14.9 % (ref 11.5–15.5)
RDW: 15.2 % (ref 11.5–15.5)
WBC: 5.5 10*3/uL (ref 4.0–10.5)
WBC: 5.6 10*3/uL (ref 4.0–10.5)
WBC: 5.6 10*3/uL (ref 4.0–10.5)
WBC: 5.9 10*3/uL (ref 4.0–10.5)
WBC: 7.8 10*3/uL (ref 4.0–10.5)
WBC: 9 10*3/uL (ref 4.0–10.5)

## 2010-04-13 LAB — POCT I-STAT 4, (NA,K, GLUC, HGB,HCT)
Glucose, Bld: 125 mg/dL — ABNORMAL HIGH (ref 70–99)
Glucose, Bld: 140 mg/dL — ABNORMAL HIGH (ref 70–99)
Glucose, Bld: 156 mg/dL — ABNORMAL HIGH (ref 70–99)
Glucose, Bld: 169 mg/dL — ABNORMAL HIGH (ref 70–99)
HCT: 20 % — ABNORMAL LOW (ref 36.0–46.0)
HCT: 21 % — ABNORMAL LOW (ref 36.0–46.0)
HCT: 27 % — ABNORMAL LOW (ref 36.0–46.0)
HCT: 29 % — ABNORMAL LOW (ref 36.0–46.0)
Hemoglobin: 7.1 g/dL — ABNORMAL LOW (ref 12.0–15.0)
Hemoglobin: 9.2 g/dL — ABNORMAL LOW (ref 12.0–15.0)
Potassium: 3.7 mEq/L (ref 3.5–5.1)
Potassium: 4.4 mEq/L (ref 3.5–5.1)
Sodium: 138 mEq/L (ref 135–145)
Sodium: 142 mEq/L (ref 135–145)
Sodium: 143 mEq/L (ref 135–145)

## 2010-04-13 LAB — CK TOTAL AND CKMB (NOT AT ARMC)
CK, MB: 6.6 ng/mL (ref 0.3–4.0)
Relative Index: 5.7 — ABNORMAL HIGH (ref 0.0–2.5)
Total CK: 115 U/L (ref 7–177)

## 2010-04-13 LAB — GLUCOSE, CAPILLARY
Glucose-Capillary: 100 mg/dL — ABNORMAL HIGH (ref 70–99)
Glucose-Capillary: 100 mg/dL — ABNORMAL HIGH (ref 70–99)
Glucose-Capillary: 103 mg/dL — ABNORMAL HIGH (ref 70–99)
Glucose-Capillary: 104 mg/dL — ABNORMAL HIGH (ref 70–99)
Glucose-Capillary: 109 mg/dL — ABNORMAL HIGH (ref 70–99)
Glucose-Capillary: 116 mg/dL — ABNORMAL HIGH (ref 70–99)
Glucose-Capillary: 117 mg/dL — ABNORMAL HIGH (ref 70–99)
Glucose-Capillary: 121 mg/dL — ABNORMAL HIGH (ref 70–99)
Glucose-Capillary: 142 mg/dL — ABNORMAL HIGH (ref 70–99)
Glucose-Capillary: 143 mg/dL — ABNORMAL HIGH (ref 70–99)
Glucose-Capillary: 151 mg/dL — ABNORMAL HIGH (ref 70–99)
Glucose-Capillary: 163 mg/dL — ABNORMAL HIGH (ref 70–99)
Glucose-Capillary: 163 mg/dL — ABNORMAL HIGH (ref 70–99)
Glucose-Capillary: 165 mg/dL — ABNORMAL HIGH (ref 70–99)
Glucose-Capillary: 166 mg/dL — ABNORMAL HIGH (ref 70–99)
Glucose-Capillary: 167 mg/dL — ABNORMAL HIGH (ref 70–99)
Glucose-Capillary: 168 mg/dL — ABNORMAL HIGH (ref 70–99)
Glucose-Capillary: 172 mg/dL — ABNORMAL HIGH (ref 70–99)
Glucose-Capillary: 175 mg/dL — ABNORMAL HIGH (ref 70–99)
Glucose-Capillary: 178 mg/dL — ABNORMAL HIGH (ref 70–99)
Glucose-Capillary: 180 mg/dL — ABNORMAL HIGH (ref 70–99)
Glucose-Capillary: 189 mg/dL — ABNORMAL HIGH (ref 70–99)
Glucose-Capillary: 191 mg/dL — ABNORMAL HIGH (ref 70–99)
Glucose-Capillary: 201 mg/dL — ABNORMAL HIGH (ref 70–99)
Glucose-Capillary: 201 mg/dL — ABNORMAL HIGH (ref 70–99)
Glucose-Capillary: 205 mg/dL — ABNORMAL HIGH (ref 70–99)
Glucose-Capillary: 225 mg/dL — ABNORMAL HIGH (ref 70–99)
Glucose-Capillary: 228 mg/dL — ABNORMAL HIGH (ref 70–99)
Glucose-Capillary: 230 mg/dL — ABNORMAL HIGH (ref 70–99)
Glucose-Capillary: 232 mg/dL — ABNORMAL HIGH (ref 70–99)
Glucose-Capillary: 237 mg/dL — ABNORMAL HIGH (ref 70–99)
Glucose-Capillary: 241 mg/dL — ABNORMAL HIGH (ref 70–99)
Glucose-Capillary: 246 mg/dL — ABNORMAL HIGH (ref 70–99)
Glucose-Capillary: 247 mg/dL — ABNORMAL HIGH (ref 70–99)
Glucose-Capillary: 267 mg/dL — ABNORMAL HIGH (ref 70–99)
Glucose-Capillary: 284 mg/dL — ABNORMAL HIGH (ref 70–99)
Glucose-Capillary: 284 mg/dL — ABNORMAL HIGH (ref 70–99)
Glucose-Capillary: 356 mg/dL — ABNORMAL HIGH (ref 70–99)
Glucose-Capillary: 360 mg/dL — ABNORMAL HIGH (ref 70–99)
Glucose-Capillary: 371 mg/dL — ABNORMAL HIGH (ref 70–99)
Glucose-Capillary: 81 mg/dL (ref 70–99)
Glucose-Capillary: 94 mg/dL (ref 70–99)
Glucose-Capillary: 96 mg/dL (ref 70–99)
Glucose-Capillary: 98 mg/dL (ref 70–99)

## 2010-04-13 LAB — POCT I-STAT 3, ART BLOOD GAS (G3+)
Acid-base deficit: 5 mmol/L — ABNORMAL HIGH (ref 0.0–2.0)
Bicarbonate: 21.4 mEq/L (ref 20.0–24.0)
O2 Saturation: 97 %
O2 Saturation: 99 %
Patient temperature: 37
Patient temperature: 37.1
TCO2: 23 mmol/L (ref 0–100)
TCO2: 24 mmol/L (ref 0–100)
pCO2 arterial: 37.3 mmHg (ref 35.0–45.0)
pCO2 arterial: 41.8 mmHg (ref 35.0–45.0)
pCO2 arterial: 43.3 mmHg (ref 35.0–45.0)
pH, Arterial: 7.3 — ABNORMAL LOW (ref 7.350–7.400)
pH, Arterial: 7.331 — ABNORMAL LOW (ref 7.350–7.400)
pH, Arterial: 7.344 — ABNORMAL LOW (ref 7.350–7.400)
pO2, Arterial: 94 mmHg (ref 80.0–100.0)

## 2010-04-13 LAB — URINALYSIS, MICROSCOPIC ONLY
Bilirubin Urine: NEGATIVE
Ketones, ur: 15 mg/dL — AB
Nitrite: NEGATIVE
Urobilinogen, UA: 1 mg/dL (ref 0.0–1.0)

## 2010-04-13 LAB — CARDIAC PANEL(CRET KIN+CKTOT+MB+TROPI)
CK, MB: 4.2 ng/mL — ABNORMAL HIGH (ref 0.3–4.0)
CK, MB: 4.3 ng/mL — ABNORMAL HIGH (ref 0.3–4.0)
CK, MB: 6.7 ng/mL (ref 0.3–4.0)
Relative Index: 3.8 — ABNORMAL HIGH (ref 0.0–2.5)
Relative Index: 3.8 — ABNORMAL HIGH (ref 0.0–2.5)
Relative Index: 5.4 — ABNORMAL HIGH (ref 0.0–2.5)
Total CK: 110 U/L (ref 7–177)
Total CK: 114 U/L (ref 7–177)
Troponin I: 0.65 ng/mL (ref 0.00–0.06)

## 2010-04-13 LAB — URINE CULTURE
Colony Count: >=100000
Culture  Setup Time: 201111131729

## 2010-04-13 LAB — COMPREHENSIVE METABOLIC PANEL
ALT: 20 U/L (ref 0–35)
AST: 16 U/L (ref 0–37)
Albumin: 2.9 g/dL — ABNORMAL LOW (ref 3.5–5.2)
Alkaline Phosphatase: 57 U/L (ref 39–117)
CO2: 25 mEq/L (ref 19–32)
Calcium: 8.9 mg/dL (ref 8.4–10.5)
Chloride: 111 mEq/L (ref 96–112)
Creatinine, Ser: 0.99 mg/dL (ref 0.4–1.2)
GFR calc Af Amer: >60 mL/min (ref >60)
Glucose, Bld: 190 mg/dL — ABNORMAL HIGH (ref 70–99)
Potassium: 3.9 mEq/L (ref 3.5–5.1)
Sodium: 141 mEq/L (ref 135–145)
Sodium: 142 mEq/L (ref 135–145)
Total Protein: 5.7 g/dL — ABNORMAL LOW (ref 6.0–8.3)
Total Protein: 5.8 g/dL — ABNORMAL LOW (ref 6.0–8.3)

## 2010-04-13 LAB — CREATININE, SERUM
Creatinine, Ser: 0.78 mg/dL (ref 0.4–1.2)
GFR calc Af Amer: 59 mL/min — ABNORMAL LOW (ref >60)
GFR calc Af Amer: >60 mL/min (ref >60)
GFR calc non Af Amer: >60 mL/min (ref >60)

## 2010-04-13 LAB — POCT I-STAT, CHEM 8
BUN: 18 mg/dL (ref 6–23)
BUN: 3 mg/dL — ABNORMAL LOW (ref 6–23)
BUN: 6 mg/dL (ref 6–23)
Calcium, Ion: 1.23 mmol/L (ref 1.12–1.32)
Calcium, Ion: 1.27 mmol/L (ref 1.12–1.32)
Creatinine, Ser: 0.8 mg/dL (ref 0.4–1.2)
Creatinine, Ser: 1.1 mg/dL (ref 0.4–1.2)
Creatinine, Ser: 1.2 mg/dL (ref 0.4–1.2)
Glucose, Bld: 185 mg/dL — ABNORMAL HIGH (ref 70–99)
Hemoglobin: 10.2 g/dL — ABNORMAL LOW (ref 12.0–15.0)
Hemoglobin: 14.6 g/dL (ref 12.0–15.0)
Sodium: 139 mEq/L (ref 135–145)
Sodium: 142 mEq/L (ref 135–145)
Sodium: 143 mEq/L (ref 135–145)
TCO2: 21 mmol/L (ref 0–100)
TCO2: 23 mmol/L (ref 0–100)
TCO2: 24 mmol/L (ref 0–100)

## 2010-04-13 LAB — BLOOD GAS, ARTERIAL
Acid-base deficit: 0.7 mmol/L (ref 0.0–2.0)
Drawn by: 283401
Patient temperature: 98.6
TCO2: 25 mmol/L (ref 0–100)

## 2010-04-13 LAB — TYPE AND SCREEN: Unit division: 0

## 2010-04-13 LAB — HEMOGLOBIN AND HEMATOCRIT, BLOOD
HCT: 25.3 % — ABNORMAL LOW (ref 36.0–46.0)
Hemoglobin: 8.5 g/dL — ABNORMAL LOW (ref 12.0–15.0)

## 2010-04-13 LAB — HEPARIN LEVEL (UNFRACTIONATED)
Heparin Unfractionated: 0.19 IU/mL — ABNORMAL LOW (ref 0.30–0.70)
Heparin Unfractionated: 0.23 IU/mL — ABNORMAL LOW (ref 0.30–0.70)
Heparin Unfractionated: 0.23 IU/mL — ABNORMAL LOW (ref 0.30–0.70)
Heparin Unfractionated: 0.3 IU/mL (ref 0.30–0.70)
Heparin Unfractionated: 0.35 IU/mL (ref 0.30–0.70)
Heparin Unfractionated: 0.71 IU/mL — ABNORMAL HIGH (ref 0.30–0.70)

## 2010-04-13 LAB — PROTIME-INR: INR: 0.95 (ref 0.00–1.49)

## 2010-04-13 LAB — LIPID PANEL
LDL Cholesterol: 70 mg/dL (ref 0–99)
Total CHOL/HDL Ratio: 3 RATIO
VLDL: 13 mg/dL (ref 0–40)

## 2010-04-13 LAB — URINE MICROSCOPIC-ADD ON

## 2010-04-13 LAB — URINALYSIS, ROUTINE W REFLEX MICROSCOPIC
Glucose, UA: 100 mg/dL — AB
Hgb urine dipstick: NEGATIVE
Ketones, ur: NEGATIVE mg/dL
pH: 5.5 (ref 5.0–8.0)

## 2010-04-13 LAB — MAGNESIUM: Magnesium: 2.8 mg/dL — ABNORMAL HIGH (ref 1.5–2.5)

## 2010-04-13 LAB — MRSA PCR SCREENING: MRSA by PCR: NEGATIVE

## 2010-04-13 LAB — TROPONIN I: Troponin I: 0.95 ng/mL (ref 0.00–0.06)

## 2010-04-13 LAB — PLATELET COUNT: Platelets: 112 10*3/uL — ABNORMAL LOW (ref 150–400)

## 2010-04-13 NOTE — Miscellaneous (Signed)
Summary: [facility] Progress Report    Cardiac Progress Report   Imported By: Sallee Provencal 04/06/2010 15:46:16  _____________________________________________________________________  External Attachment:    Type:   Image     Comment:   External Document

## 2010-04-14 ENCOUNTER — Encounter (HOSPITAL_COMMUNITY): Payer: Medicare Other

## 2010-04-14 DIAGNOSIS — R0602 Shortness of breath: Secondary | ICD-10-CM | POA: Insufficient documentation

## 2010-04-14 DIAGNOSIS — J90 Pleural effusion, not elsewhere classified: Secondary | ICD-10-CM | POA: Insufficient documentation

## 2010-04-15 ENCOUNTER — Encounter: Payer: Self-pay | Admitting: Cardiology

## 2010-04-15 ENCOUNTER — Ambulatory Visit (INDEPENDENT_AMBULATORY_CARE_PROVIDER_SITE_OTHER): Payer: Medicare Other | Admitting: Cardiology

## 2010-04-15 DIAGNOSIS — I5022 Chronic systolic (congestive) heart failure: Secondary | ICD-10-CM

## 2010-04-15 DIAGNOSIS — I2510 Atherosclerotic heart disease of native coronary artery without angina pectoris: Secondary | ICD-10-CM

## 2010-04-15 DIAGNOSIS — I428 Other cardiomyopathies: Secondary | ICD-10-CM | POA: Insufficient documentation

## 2010-04-15 DIAGNOSIS — E7800 Pure hypercholesterolemia, unspecified: Secondary | ICD-10-CM

## 2010-04-16 ENCOUNTER — Encounter (HOSPITAL_COMMUNITY): Payer: Medicare Other

## 2010-04-19 ENCOUNTER — Encounter (HOSPITAL_COMMUNITY): Payer: Medicare Other

## 2010-04-20 NOTE — Assessment & Plan Note (Signed)
Summary: [facility]      Allergies Added: NKDA  Visit Type:  Follow-up Primary Provider:  Dr. Deland Pretty  CC:  CAD.  History of Present Illness: The patient presents for followup of her coronary disease. Since I last saw her she has done well. She is participating in cardiac rehabilitation. She does get tired. However, she has no chest pain back she had prior to bypass. She does not have any significant shortness of breath and denies PND or orthopnea. She is not noticing any palpitations, presyncope or syncope. No her blood pressure is slightly elevated today she knows that it is well controlled every time she is at rehabilitation and I have not received the message otherwise.  Current Medications (verified): 1)  Amlodipine Besylate 10 Mg Tabs (Amlodipine Besylate) .Marland Kitchen.. 1 By Mouth Daily 2)  Lopressor 100 Mg Tabs (Metoprolol Tartrate) .Marland Kitchen.. 1 By Mouth Two Times A Day 3)  Aspirin 81 Mg Tabs (Aspirin) .... One Daily 4)  Glucophage 1000 Mg Tabs (Metformin Hcl) .Marland Kitchen.. 1 By Mouth Two Times A Day 5)  Glipizide 5 Mg Tabs (Glipizide) .Marland Kitchen.. 1 By Mouth Qam 6)  Losartan Potassium 100 Mg Tabs (Losartan Potassium) .Marland Kitchen.. 1 By Mouth Daily 7)  Prilosec 20 Mg Cpdr (Omeprazole) 8)  Furosemide 40 Mg Tabs (Furosemide) .... One Daily 9)  Ultram 50 Mg Tabs (Tramadol Hcl) .... As Directed 10)  Alprazolam 0.25 Mg Tabs (Alprazolam) .... As Directed  Allergies (verified): No Known Drug Allergies  Past History:  Past Medical History: Reviewed history from 01/26/2010 and no changes required.  1. Coronary artery disease (Stenting of RCA, CABG)     stented).  2. Mildly reduced EF (45%).   3. Diabetes mellitus.   4. Hypertension.   Past Surgical History: Reviewed history from 01/26/2010 and no changes required. Bilateral cataract surgery CABG ( x5 (left internal mammary artery to  LAD, saphenous vein graft to second diagonal, saphenous vein       graft to obtuse marginal 1, saphenous vein graft to  posterior  descending).  Review of Systems       As stated in the HPI and negative for all other systems.   Vital Signs:  Patient profile:   77 year old female Height:      64 inches Weight:      152 pounds BMI:     26.19 Pulse rate:   80 / minute Resp:     16 per minute BP sitting:   155 / 87  (left arm)  Vitals Entered By: Levora Angel, CNA (April 15, 2010 10:14 AM)  Physical Exam  General:  Well developed, well nourished, in no acute distress. Head:  normocephalic and atraumatic Eyes:  PERRLA/EOM intact; conjunctiva and lids normal. Mouth:  Teeth, gums and palate normal. Oral mucosa normal. Neck:  Neck supple, no JVD. No masses, thyromegaly or abnormal cervical nodes. Chest Wall:  Well-healed sternotomy scar without erythema or exudate Lungs:  Clear bilaterally to auscultation and percussion. Abdomen:  Bowel sounds positive; abdomen soft and non-tender without masses, organomegaly, or hernias noted. No hepatosplenomegaly. Msk:  Back normal, normal gait. Muscle strength and tone normal. Extremities:  No clubbing or cyanosis. Neurologic:  Alert and oriented x 3. Skin:  Intact without lesions or rashes. Cervical Nodes:  no significant adenopathy Inguinal Nodes:  no significant adenopathy Psych:  Normal affect.   Detailed Cardiovascular Exam  Neck    Carotids: Carotids full and equal bilaterally without bruits.      Neck  Veins: Normal, no JVD.    Heart    Inspection: no deformities or lifts noted.      Palpation: normal PMI with no thrills palpable.      Auscultation: regular rate and rhythm, S1, S2 without murmurs, rubs, gallops, or clicks.    Vascular    Abdominal Aorta: no palpable masses, pulsations, or audible bruits.      Femoral Pulses: normal femoral pulses bilaterally.      Pedal Pulses: normal pedal pulses bilaterally.      Radial Pulses: normal radial pulses bilaterally.      Peripheral Circulation: no clubbing, cyanosis, or edema noted with normal  capillary refill.     Impression & Recommendations:  Problem # 1:  CORONARY ATHEROSCLEROSIS NATIVE CORONARY ARTERY (ICD-414.01) She is having no new symptoms. No change in therapy is indicated. She will continue with risk reduction.  Problem # 2:  DYSLIPIDEMIA (ICD-272.4) I reviewed her lipid profile done in February with an LDL of 97 HDL of 43. With her diabetes I would suggest goals of an LDL less than 70 and HDL greater than 50. However, she was only recently started on Crestor and I will defer management to her primary physician and endocrinologist.  Problem # 3:  HYPERTENSION (ICD-401.9) Blood pressure is  elevated. However, she is watched closely a cardiac rehabilitation and it has not been elevated. She takes it at home. If he does go up we will adjust her medications are at target blood pressure in the 130s over 80s or less.  Problem # 4:  CARDIOMYOPATHY (ICD-425.4) Her ejection fraction was mildly low.  I will repeat an echo in Nov.  For now she will continue the meds as listed.

## 2010-04-21 ENCOUNTER — Encounter (HOSPITAL_COMMUNITY): Payer: Medicare Other

## 2010-04-23 ENCOUNTER — Encounter (HOSPITAL_COMMUNITY): Payer: Medicare Other

## 2010-04-26 ENCOUNTER — Encounter (HOSPITAL_COMMUNITY): Payer: Medicare Other

## 2010-04-28 ENCOUNTER — Encounter (HOSPITAL_COMMUNITY): Payer: Medicare Other

## 2010-04-30 ENCOUNTER — Encounter (HOSPITAL_COMMUNITY): Payer: Medicare Other

## 2010-05-03 ENCOUNTER — Encounter (HOSPITAL_COMMUNITY): Payer: Medicare Other | Attending: Cardiology

## 2010-05-03 DIAGNOSIS — Z5189 Encounter for other specified aftercare: Secondary | ICD-10-CM | POA: Insufficient documentation

## 2010-05-03 DIAGNOSIS — I2510 Atherosclerotic heart disease of native coronary artery without angina pectoris: Secondary | ICD-10-CM | POA: Insufficient documentation

## 2010-05-03 DIAGNOSIS — E119 Type 2 diabetes mellitus without complications: Secondary | ICD-10-CM | POA: Insufficient documentation

## 2010-05-03 DIAGNOSIS — I10 Essential (primary) hypertension: Secondary | ICD-10-CM | POA: Insufficient documentation

## 2010-05-03 DIAGNOSIS — E785 Hyperlipidemia, unspecified: Secondary | ICD-10-CM | POA: Insufficient documentation

## 2010-05-05 ENCOUNTER — Encounter (HOSPITAL_COMMUNITY): Payer: Medicare Other

## 2010-05-07 ENCOUNTER — Encounter (HOSPITAL_COMMUNITY): Payer: Medicare Other

## 2010-05-10 ENCOUNTER — Encounter (HOSPITAL_COMMUNITY): Payer: Medicare Other

## 2010-05-12 ENCOUNTER — Encounter (HOSPITAL_COMMUNITY): Payer: Medicare Other

## 2010-05-13 ENCOUNTER — Emergency Department (HOSPITAL_COMMUNITY): Payer: Medicare Other

## 2010-05-13 ENCOUNTER — Observation Stay (HOSPITAL_COMMUNITY)
Admission: EM | Admit: 2010-05-13 | Discharge: 2010-05-17 | Disposition: A | Discharge status: Home / Self Care | Payer: Medicare Other | Attending: Internal Medicine | Admitting: Internal Medicine

## 2010-05-13 DIAGNOSIS — Y92009 Unspecified place in unspecified non-institutional (private) residence as the place of occurrence of the external cause: Secondary | ICD-10-CM | POA: Insufficient documentation

## 2010-05-13 DIAGNOSIS — S42293A Other displaced fracture of upper end of unspecified humerus, initial encounter for closed fracture: Secondary | ICD-10-CM | POA: Insufficient documentation

## 2010-05-13 DIAGNOSIS — I498 Other specified cardiac arrhythmias: Secondary | ICD-10-CM | POA: Insufficient documentation

## 2010-05-13 DIAGNOSIS — I428 Other cardiomyopathies: Secondary | ICD-10-CM | POA: Insufficient documentation

## 2010-05-13 DIAGNOSIS — R55 Syncope and collapse: Principal | ICD-10-CM | POA: Insufficient documentation

## 2010-05-13 DIAGNOSIS — I2510 Atherosclerotic heart disease of native coronary artery without angina pectoris: Secondary | ICD-10-CM | POA: Insufficient documentation

## 2010-05-13 DIAGNOSIS — E119 Type 2 diabetes mellitus without complications: Secondary | ICD-10-CM | POA: Insufficient documentation

## 2010-05-13 DIAGNOSIS — Z7901 Long term (current) use of anticoagulants: Secondary | ICD-10-CM | POA: Insufficient documentation

## 2010-05-13 DIAGNOSIS — W010XXA Fall on same level from slipping, tripping and stumbling without subsequent striking against object, initial encounter: Secondary | ICD-10-CM | POA: Insufficient documentation

## 2010-05-13 LAB — BASIC METABOLIC PANEL
BUN: 10 mg/dL (ref 6–23)
Chloride: 100 mEq/L (ref 96–112)
Creatinine, Ser: 0.96 mg/dL (ref 0.4–1.2)
Glucose, Bld: 181 mg/dL — ABNORMAL HIGH (ref 70–99)
Potassium: 3.9 mEq/L (ref 3.5–5.1)

## 2010-05-13 LAB — CK TOTAL AND CKMB (NOT AT ARMC)
CK, MB: 3 ng/mL (ref 0.3–4.0)
Relative Index: INVALID (ref 0.0–2.5)
Total CK: 63 U/L (ref 7–177)

## 2010-05-13 LAB — CBC
Hemoglobin: 11.4 g/dL — ABNORMAL LOW (ref 12.0–15.0)
MCH: 28.5 pg (ref 26.0–34.0)
MCHC: 32.9 g/dL (ref 30.0–36.0)
MCV: 86.8 fL (ref 78.0–100.0)
RBC: 4 MIL/uL (ref 3.87–5.11)

## 2010-05-13 LAB — DIFFERENTIAL
Eosinophils Absolute: 0 10*3/uL (ref 0.0–0.7)
Lymphs Abs: 1.4 10*3/uL (ref 0.7–4.0)
Monocytes Absolute: 0.5 10*3/uL (ref 0.1–1.0)
Monocytes Relative: 6 % (ref 3–12)
Neutro Abs: 6.1 10*3/uL (ref 1.7–7.7)
Neutrophils Relative %: 76 % (ref 43–77)

## 2010-05-13 LAB — TROPONIN I: Troponin I: 0.03 ng/mL (ref 0.00–0.06)

## 2010-05-13 LAB — COMPREHENSIVE METABOLIC PANEL
ALT: 15 U/L (ref 0–35)
AST: 18 U/L (ref 0–37)
Albumin: 3.7 g/dL (ref 3.5–5.2)
Calcium: 9.7 mg/dL (ref 8.4–10.5)
GFR calc Af Amer: >60 mL/min (ref >60)
Potassium: 4 mEq/L (ref 3.5–5.1)
Sodium: 134 mEq/L — ABNORMAL LOW (ref 135–145)
Total Protein: 7.5 g/dL (ref 6.0–8.3)

## 2010-05-14 ENCOUNTER — Observation Stay (HOSPITAL_COMMUNITY): Payer: Medicare Other

## 2010-05-14 ENCOUNTER — Encounter (HOSPITAL_COMMUNITY): Payer: Medicare Other

## 2010-05-14 DIAGNOSIS — R55 Syncope and collapse: Secondary | ICD-10-CM

## 2010-05-14 DIAGNOSIS — I059 Rheumatic mitral valve disease, unspecified: Secondary | ICD-10-CM

## 2010-05-14 DIAGNOSIS — I498 Other specified cardiac arrhythmias: Secondary | ICD-10-CM

## 2010-05-14 LAB — CARDIAC PANEL(CRET KIN+CKTOT+MB+TROPI)
CK, MB: 2 ng/mL (ref 0.3–4.0)
Total CK: 54 U/L (ref 7–177)

## 2010-05-14 LAB — GLUCOSE, CAPILLARY
Glucose-Capillary: 168 mg/dL — ABNORMAL HIGH (ref 70–99)
Glucose-Capillary: 148 mg/dL — ABNORMAL HIGH (ref 70–99)
Glucose-Capillary: 242 mg/dL — ABNORMAL HIGH (ref 70–99)

## 2010-05-14 LAB — HEMOGLOBIN A1C: Mean Plasma Glucose: 148 mg/dL — ABNORMAL HIGH (ref <117)

## 2010-05-15 LAB — DIFFERENTIAL
Basophils Absolute: 0 10*3/uL (ref 0.0–0.1)
Lymphocytes Relative: 33 % (ref 12–46)
Lymphs Abs: 1.4 10*3/uL (ref 0.7–4.0)
Monocytes Absolute: 0.5 10*3/uL (ref 0.1–1.0)
Monocytes Relative: 11 % (ref 3–12)
Neutro Abs: 2.4 10*3/uL (ref 1.7–7.7)

## 2010-05-15 LAB — BASIC METABOLIC PANEL
BUN: 10 mg/dL (ref 6–23)
CO2: 25 mEq/L (ref 19–32)
Calcium: 9.3 mg/dL (ref 8.4–10.5)
Glucose, Bld: 139 mg/dL — ABNORMAL HIGH (ref 70–99)
Sodium: 139 mEq/L (ref 135–145)

## 2010-05-15 LAB — CBC
HCT: 27 % — ABNORMAL LOW (ref 36.0–46.0)
Hemoglobin: 9 g/dL — ABNORMAL LOW (ref 12.0–15.0)
MCH: 28.7 pg (ref 26.0–34.0)
MCHC: 33.3 g/dL (ref 30.0–36.0)
MCV: 86 fL (ref 78.0–100.0)

## 2010-05-15 LAB — GLUCOSE, CAPILLARY
Glucose-Capillary: 250 mg/dL — ABNORMAL HIGH (ref 70–99)
Glucose-Capillary: 254 mg/dL — ABNORMAL HIGH (ref 70–99)

## 2010-05-16 LAB — GLUCOSE, CAPILLARY
Glucose-Capillary: 158 mg/dL — ABNORMAL HIGH (ref 70–99)
Glucose-Capillary: 186 mg/dL — ABNORMAL HIGH (ref 70–99)

## 2010-05-16 LAB — BASIC METABOLIC PANEL
BUN: 13 mg/dL (ref 6–23)
Chloride: 105 mEq/L (ref 96–112)
Glucose, Bld: 173 mg/dL — ABNORMAL HIGH (ref 70–99)
Potassium: 4.2 mEq/L (ref 3.5–5.1)

## 2010-05-17 ENCOUNTER — Telehealth: Payer: Self-pay

## 2010-05-17 ENCOUNTER — Encounter (HOSPITAL_COMMUNITY): Payer: Medicare Other

## 2010-05-17 DIAGNOSIS — R001 Bradycardia, unspecified: Secondary | ICD-10-CM

## 2010-05-17 LAB — GLUCOSE, CAPILLARY
Glucose-Capillary: 225 mg/dL — ABNORMAL HIGH (ref 70–99)
Glucose-Capillary: 197 mg/dL — ABNORMAL HIGH (ref 70–99)

## 2010-05-17 NOTE — Telephone Encounter (Signed)
Per Dr Percival Spanish this pt is currently in the hospital pending discharge.  He would like this pt to have a 48 hour holter monitor in one month with a scheduled follow-up office visit. Pt scheduled for 48 hour holter monitor on 06/08/10 at 11:00 (Dx Bradycardia) and EPH appointment on 06/17/10 at 12:00.  These appointments were given to Randie Heinz the nurse taking care of pt while in the hospital.

## 2010-05-19 ENCOUNTER — Encounter (HOSPITAL_COMMUNITY): Payer: Medicare Other

## 2010-05-21 ENCOUNTER — Encounter (HOSPITAL_COMMUNITY): Payer: Medicare Other

## 2010-05-21 NOTE — Consult Note (Signed)
NAME:  [name], [name] NO.:  [id number]  MEDICAL RECORD NO.:  [id number]           PATIENT TYPE:  O  LOCATION:  [id number]                         FACILITY:  [facility]  PHYSICIAN:  Carlena Bjornstad, MD, FACCDATE OF BIRTH:  [birth year]  DATE OF CONSULTATION: DATE OF DISCHARGE:                                CONSULTATION   HISTORY OF PRESENT ILLNESS:  The patient is a delightful 77 year old female.  We are called for bradycardia in the question of syncope.  The patient fell.  It is not at all clear that she had a syncopal episode.  This has to be considered.  She does remember hitting the ground and the pain from hitting her shoulder.  On the monitor in the hospital, the equipment read severe bradycardia.  We were consulted. Review of the rhythm strip reveals that there is low amplitude and the patient clearly has sinus rhythm with no evidence of significant bradycardia.  She is on a high-dose of a beta-blocker.  The patient does have coronary disease post stents and CABG in the past. There is history of decreased ejection fraction in the range of 45% in the past.  ALLERGIES:  LISINOPRIL.  MEDICATIONS: 1. Amlodipine. 2. Lopressor 100 p.o. b.i.d. 3. Aspirin. 4. Glucophage. 5. Glipizide. 6. Losartan. 7. Prilosec. 8. Furosemide. 9. Tramadol. 10.Xanax.  Other medical problems, see the complete list below.  SOCIAL HISTORY:  The patient does not smoke.  FAMILY HISTORY:  Mother died at age 17 of unknown causes.  Father died at age 68.  There is no known family history of coronary disease.  REVIEW OF SYSTEMS:  The patient denies fever, chills, headache, sweats, rash, change in vision, change in hearing, chest pain, cough, nausea, vomiting, urinary symptoms.  All other systems are reviewed and are negative.  PHYSICAL EXAMINATION:  GENERAL:  The patient is oriented to person, time, and place.  Affect is normal. VITAL SIGNS:  Blood pressure is 150/72 with a pulse of  74. HEENT:  Head is atraumatic.  No xanthelasma. LUNGS:  Clear.  Respiratory effort is not labored. CARDIAC:  Reveals S1 and S2.  There are no clicks or significant murmurs.  She is wearing a sling for her right arm and shoulder. ABDOMEN:  Soft.  There is no peripheral edema.  There are no obvious skin rashes.  LABORATORY DATA:  EKG reveals sinus rhythm.  There are inferior Q-waves. There are no acute changes.  There are nonspecific ST-T wave changes. Chest x-ray reveals no active lung disease.  There is a right humeral neck fracture noted that is mildly comminuted and angulated.  Hemoglobin is 11.4.  BUN is 9 with a creatinine of 0.92.  Cardiac enzymes are 0.03, 0.02, and 0.01.  Telemetry reveals normal sinus rhythm.  Two-dimensional echo was done today.  There is mild LVH.  The EF is 55-60%.  There is mild mitral regurgitation.  ASSESSMENT:  Problems include, 1. Falling episode.  Syncope has to be considered, but there is no     proof.  No further workup. 2. Fractured humerus from her fall. 3. Coronary disease.  There is  a history of a stent and CABG.  This is     stable. 4. History of ejection fraction 45%.  LV function is normal today.  No     further workup. 5. Diabetes. 6. Hypertension. 7. Question of bradycardia on the monitor today.  The monitor     equipment read bradycardia when there was normal sinus rhythm.     There has been no documented significant bradycardia.  PLAN: 1. No further cardiac workup. 2. Decrease Lopressor to 50 p.o. b.i.d. just in case the patient has     had some bradycardia. 3. Outpatient followup with Dr. Percival Spanish with an event recorder to be     sure they were not missing any arrhythmias.     Carlena Bjornstad, MD, [facility]     JDK/MEDQ  D:  05/14/2010  T:  05/14/2010  Job:  [id number]  Electronically Signed by Dola Argyle MD [place] on 05/21/2010 01:07:23 PM

## 2010-05-24 ENCOUNTER — Encounter (HOSPITAL_COMMUNITY): Payer: Medicare Other

## 2010-05-25 NOTE — Discharge Summary (Signed)
NAME:  [name], [name]                ACCOUNT NO.:  [id number]  MEDICAL RECORD NO.:  [id number]           PATIENT TYPE:  O  LOCATION:  [id number]                         FACILITY:  [facility]  PHYSICIAN:  Romero Belling, MD       DATE OF BIRTH:  [birth year]  DATE OF ADMISSION:  05/13/2010 DATE OF DISCHARGE:  05/17/2010                        DISCHARGE SUMMARY - REFERRING   PRIMARY CARE PHYSICIAN:  Lynnell Chad. Shelia Media, MD  [name] discharge diagnoses are as follows: 1. Syncope, possibly secondary to symptomatic bradycardia. 2. Type 2 diabetes mellitus. 3. Right humeral fracture secondary to fall. 4. Bradycardia. 5. Coronary artery disease. 6. History of cardiomyopathy, previous ejection fraction of 45%, now     ejection fraction is 55-60% with grade 1 diastolic dysfunction.  Discharge medications for the patient are as follows: 1. Acetaminophen 650 mg every 4 hours as needed. 2. Xanax 0.5 mg by mouth daily as needed. 3. Imdur 60 mg p.o. daily. 4. Ibuprofen 400 mg by mouth every 6 hours as needed. 5. NovoLog 1-9 units subcutaneously 3 times a day with meals. 6. NovoLog 2-5 units subcutaneously daily at bedtime. 7. Metformin 500 mg by mouth daily with supper. 8. Metformin 1000 mg by mouth daily with meal. 9. Metoprolol 50 mg by mouth twice daily. 10.Oxycodone 2.5-5 mg by mouth every 6 hours as needed for pain. 11.Phenergan 6.25 mg by mouth every 6 hours as needed. 12.Amlodipine 10 mg p.o. daily. 13.Aspirin 81 mg 2 tablets by mouth daily. 14.Glucotrol XL 5 mg 1 tablet by mouth every morning. 15.Losartan 500 mg p.o. daily.  DISPOSITION:  The patient is discharged to SNF.  [name] diet should be carbohydrate modified.  [name] procedures performed.  The patient had the following studies: 1. The patient had a right shoulder x-ray which showed a right     proximal humeral surgical neck fracture.  [name] CT of the C-     spine showed spondylosis, no acute findings. 2. The  patient's CT of the head showed no acute intracranial     abnormality, atrophy, chronic microvascular disease. 3. [name] chest x-ray, one view, showed stable appearance of     chest, new right femoral neck fractures since prior study. 4. [name] 2-D echocardiogram showed the following findings:     The patient had a left ventricular ejection fraction of 55-60%,     regional wall motion cannot be excluded.  Doppler parameters are     consistent with abnormal left ventricular relaxation, grade 1     diastolic dysfunction.  Mild regurg systolic pressure, pulmonary     artery systolic pressure was mildly increased.  Also consulted on     this case was Dr. Gaynelle Arabian, [facility].  The patient should follow up with Deland Pretty, SNF PCP within 1 week. The patient should follow up with Minus Breeding in 1-2 weeks.  Also, [facility] will arrange an outpatient event monitor to check for arrhythmias.  The patient should follow up with Dr. Pilar Plate Aluisio in 2 weeks.  [name] diet is again carbohydrate modified.  The patient should be nonweightbearing on  the right upper extremity.  BRIEF HISTORY OF PRESENT ILLNESS:  This is an 77 year old female with a history of multiple medical problems, who came to the hospital after she fell and had a probable syncopal episode and fell on her right arm and broke her right humerus.  HOSPITAL COURSE: 1. Syncope.  The patient was admitted to the hospital.  The patient on     telemetry had evidence of bradycardia with a heart rate as low as     29 into the 40s.  The patient was seen in consultation by [facility]     [facility]iology.  The metoprolol was decreased to 50 mg p.o. b.i.d.  Dr.     Ron Parker did not feel that the patient was having bradycardia and he     felt there was a telemetry error.  Anyway, the patient's metoprolol     dose was decreased to compensate for the decreased metoprolol dose.     The patient was started on  isosorbide dinitrate which was up-     titrated.  [facility] recommended an outpatient event     monitor to look for arrhythmias. 2. Type 2 diabetes mellitus.  The patient was placed on a sliding     scale and the patient was continued on metformin. 3. Bradycardia.  [name] metoprolol decreased to 50 mg p.o.     b.i.d. 4. Coronary artery disease.  The patient was continued on aspirin. 5. Right humeral fracture.  The patient was seen in consultation by     Gaynelle Arabian.  He recommended nonoperative management,     nonweightbearing on the right upper extremity.  [name] arm     was continued to place on a sling.  The patient will need an     outpatient followup with Dr. Wynelle Link. 6. For DVT prophylaxis, the patient received SCDs.  [name] disposition is SNF.     Romero Belling, MD     NH/MEDQ  D:  05/17/2010  T:  05/17/2010  Job:  [id number]  cc:   Lynnell Chad. Shelia Media, M.D. Gaynelle Arabian, M.D. Carlena Bjornstad, MD, [facility] Minus Breeding, MD, [facility]  Electronically Signed by Romero Belling MD on 05/25/2010 10:34:23 PM

## 2010-05-26 ENCOUNTER — Encounter (HOSPITAL_COMMUNITY): Payer: Medicare Other

## 2010-05-28 ENCOUNTER — Encounter (HOSPITAL_COMMUNITY): Payer: Medicare Other

## 2010-05-31 ENCOUNTER — Encounter (HOSPITAL_COMMUNITY): Payer: Medicare Other

## 2010-06-02 ENCOUNTER — Encounter (HOSPITAL_COMMUNITY): Payer: Self-pay

## 2010-06-02 NOTE — H&P (Signed)
NAME:  [name], [name] NO.:  [id number]  MEDICAL RECORD NO.:  [id number]           PATIENT TYPE:  E  LOCATION:  [facility]                         FACILITY:  [facility]  PHYSICIAN:  Arlyss Repress, MD        DATE OF BIRTH:  04-02-1927  DATE OF ADMISSION:  05/13/2010 DATE OF DISCHARGE:                             HISTORY & PHYSICAL   CHIEF COMPLAINT:  Right humeral fracture.  HISTORY OF PRESENT ILLNESS:  An 77 year old female with complaints of fall at home.  She is apparently outside and there is some question as to whether she tripped or had an episode of syncope.  She thinks that she might have had an episode of syncope outside and therefore tripped and fell.  She landed on her right side and felt shoulder pain and presented to the ER for evaluation after her friend called 911.  CT scan of the brain was negative for any acute process.  CT scan C-spine showed spondylosis but no evidence of any fracture.  A right shoulder x-ray showed a right proximal humerus surgical neck fracture.  Orthopedics was apparently consulted and stated that she can follow up within a week for further evaluation.  This was a minimally comminuted and displaced fracture of the right humerus.  There was no associated subluxation or dislocation of the glenohumeral joint.  The patient will be admitted for question of syncope or near-syncope and right proximal humerus neck fracture.  PAST MEDICAL HISTORY: 1. CAD status post stenting of the RCA status post CABG. 2. Cardiomyopathy:  EF 45%. 3. Diabetes type 2. 4. Hypertension. 5. Hyperlipidemia.  PAST SURGICAL HISTORY: 1. CABG x5 (LIMA to the LAD, SVG to the second diagonal, SVG to the     obtuse marginal, SVG to the posterior descending). 2. Bilateral cataract surgery.  SOCIAL HISTORY:  The patient does not smoke or drink.  She is living by herself.  She has 1 son and 1 daughter.  She does not believe that she will be able to take care of  herself at home and therefore she is also admitted for this reason as well.  Her daughter is driving up from [place] and will be here in the morning.  FAMILY HISTORY:  Mother died at age 52 of unknown causes.  Father died of old age at age 44.  There is no family history of heart disease, cancer, stroke, diabetes.  ALLERGIES:  LISINOPRIL.  MEDICATIONS: 1. Amlodipine 10 mg p.o. daily. 2. Lopressor 100 mg p.o. b.i.d. 3. Enteric-coated aspirin 81 mg p.o. daily. 4. Glucophage 1000 mg p.o. b.i.d. 5. Glipizide 5 mg p.o. q.a.m. 6. Losartan 100 mg p.o. daily. 7. Prilosec 20 mg p.o. daily. 8. Furosemide 40 mg p.o. daily. 9. Tramadol 50 mg p.r.n. 10.Xanax 0.25 mg p.r.n.  REVIEW OF SYSTEMS:  Negative for all 10 organ systems except for pertinent positive as stated above.  PHYSICAL EXAMINATION:  VITAL SIGNS:  Temperature 97.2, pulse 77, blood pressure 175/90, pulse ox is 95% on room air. HEENT:  Anicteric. NECK:  No JVD, no bruit. HEART:  Midline scar.  Regular rate and  rhythm.  S1 and S2.  No murmurs, gallops or rubs. LUNGS:  Clear to auscultation bilaterally. ABDOMEN:  Soft, nontender, nondistended.  Positive bowel sounds. EXTREMITIES:  No cyanosis, clubbing or edema. SKIN:  No rashes. LYMPH NODES:  No adenopathy. NEURO:  Nonfocal.  EKG pending.  Chest x-ray pending.  Cardiac markers pending.  Labs pending.  ASSESSMENT AND PLAN: 1. Near-syncope, syncope.  The patient placed on telemetry.  We will     check CK, CK-MB, troponin-I q.6 h. x3 sets.  We will check a D-     dimer, check a cardiac echo, carotid ultrasound and orthostatics. 2. Diabetes type 2.  Continue home medications and the patient will be     placed on fingerstick blood sugars a.c. and nightly.  NovoLog     sensitive sliding scale with nightly coverage. 3. Coronary artery disease status post coronary artery bypass graft,     status post stent.  Continue present medications. 4. Right humeral fracture.  The patient  will need to follow up with     orthopedics obviously.  She can do this as an outpatient 5. Deep vein thrombosis prophylaxis.  SCDs.     Arlyss Repress, MD     JYK/MEDQ  D:  05/13/2010  T:  05/13/2010  Job:  [id number]  cc:   Lynnell Chad. Shelia Media, M.D. Minus Breeding, MD, [facility]  Electronically Signed by Jani Gravel MD on 06/02/2010 11:31:38 PM

## 2010-06-04 ENCOUNTER — Encounter (HOSPITAL_COMMUNITY): Payer: Self-pay

## 2010-06-07 ENCOUNTER — Encounter (HOSPITAL_COMMUNITY): Payer: Self-pay

## 2010-06-08 ENCOUNTER — Encounter (INDEPENDENT_AMBULATORY_CARE_PROVIDER_SITE_OTHER): Payer: Medicare Other

## 2010-06-08 DIAGNOSIS — I498 Other specified cardiac arrhythmias: Secondary | ICD-10-CM

## 2010-06-08 DIAGNOSIS — R001 Bradycardia, unspecified: Secondary | ICD-10-CM

## 2010-06-09 ENCOUNTER — Encounter (HOSPITAL_COMMUNITY): Payer: Self-pay

## 2010-06-11 ENCOUNTER — Encounter (HOSPITAL_COMMUNITY): Payer: Self-pay

## 2010-06-14 ENCOUNTER — Encounter: Payer: Self-pay | Admitting: Cardiology

## 2010-06-14 ENCOUNTER — Encounter (HOSPITAL_COMMUNITY): Payer: Self-pay

## 2010-06-15 NOTE — Assessment & Plan Note (Signed)
OFFICE VISIT   [name] [name]  DOB:  03-Jul-1927                                        January 18, 2010  CHART #:  [id number]   HISTORY:  The patient is an 77 year old woman who recently underwent  coronary artery bypass grafting x5 on December 08, 2009, by Dr.  Roxan Hockey for severe three-vessel coronary artery disease with a non-Q-  wave myocardial infarction.  She is seen today in routine office visit  followup.  The last time we saw her in office, she did have a left-sided  pleural effusion.  She was treated at that time with a course of  diuretics.  Also, the patient does have an elevated left hemidiaphragm  postoperatively.  Currently, she reports that she is having no  difficulties related to shortness of breath.  She is having no  significant discomforts and is not taking pain meds at this time.  She  is continuing to increase her ambulation and that is going well.  She  has had no recent fevers, chills, or other significant constitutional  symptoms.  In general, she reports that she is doing very well.   Chest x-ray was obtained on today's date.  It appears as though the  fluid on the left side is very small at this point.  Again noted, there  is the elevated left hemidiaphragm.  There is no other significant  findings at this time.   PHYSICAL EXAMINATION:  Vital Signs:  Blood pressure is 156/83, pulse 84,  respirations 17, and oxygen saturation 97% on room air.  General  Appearance:  Well-developed elderly black female, in no acute distress.  Pulmonary:  Diminished breath sounds in the left base, otherwise clear.  Cardiac:  Regular rate and rhythm.  Normal S1 and S2.  No murmurs,  gallops, or rubs.  Extremities:  No significant peripheral edema.  Incisions are healing well without evidence of infection.   ASSESSMENT:  The patient is making good continued progress in her  recovery following her surgical revascularization.  She is scheduled to  see Dr. Shelia [name] tomorrow and Dr. Percival Spanish on the 27th of this month.  Currently, there do not appear to be any significant surgical needs.  We  will see her again on a p.r.n. basis.  Of note, she is at home from the  skilled nursing facility and  continues to get physical therapy, occupational therapy at home at  present.  We of course will be happy to see at any time if there is any  new issues requiring our services.   [name] [name], P.A.-C.   [name] [name]  D:  01/18/2010  T:  01/19/2010  Job:  [id number]   cc:   Darlene Breeding, MD, [facility]  [name] [name], M.D.

## 2010-06-15 NOTE — Assessment & Plan Note (Signed)
[facility]                            CARDIOLOGY OFFICE NOTE   [name], [name]                         MRN:          [id number]  DATE:01/02/2008                            DOB:          Jun 22, 1927    PRIMARY CARE PHYSICIAN:  Lynnell Chad. Shelia Media, MD   REASON FOR PRESENTATION:  Evaluate the patient with coronary disease.   HISTORY OF PRESENT ILLNESS:  The patient is a pleasant 77 year old  African American female with coronary disease as described below.  Since  I last saw her, she has done well.  She was pending quite a bit of time  in [place] and did not exercise like I have suggested.  However, with  her activities of daily living, she does not have any chest discomfort,  neck or arm discomfort.  She does not describe any shortness breath,  PND, or orthopnea.  She stated she has been feeling pretty well.  She is  not to see Dr. Chalmers Cater for management of her diabetes and managing also  for help with her dyslipidemia.   PAST MEDICAL HISTORY:  1. Coronary artery disease (see the January 16, 2007, note for      details).  2. Well-preserved ejection fraction.  3. Diabetes mellitus.  4. Hypertension.   ALLERGIES:  None.   MEDICATIONS:  1. Lopressor 100 mg b.i.d.  2. Lotrel 10/40 daily.  3. Aspirin 162 mg daily.  4. Folic acid.  5. Glucotrol 10 mg daily.  6. Glucophage 1000 mg b.i.d.   REVIEW OF SYSTEMS:  As stated in the HPI and otherwise negative for  other systems.   PHYSICAL EXAMINATION:  GENERAL:  The patient is in no distress.  VITAL SIGNS:  Blood pressure 177/89, heart rate 76 and regular, weight  175 pounds, body mass index 31.  NECK:  No jugular venous distention at 45 degrees, carotid upstroke  brisk and symmetric, no bruits, no thyromegaly.  LYMPHATICS:  No adenopathy.  LUNGS:  Clear to auscultation bilaterally.  HEART:  PMI not displaced or sustained, S1 and S2 within normal limits,  no S3, no S4, no clicks, no rubs, no  murmurs.  ABDOMEN:  Flat, positive bowel sounds normal in frequency and pitch, no  bruits, no rebound, no guarding, no midline pulsatile mass, no  organomegaly.  SKIN:  No rashes, no nodules.  EXTREMITIES:  Pulse 2+, no edema.   EKG:  Sinus rhythm, rate 72, axis within normal limits, intervals within  normal limits, inferior T-wave flattening, unchanged from previous.   ASSESSMENT AND PLAN:  1. Coronary artery disease.  The patient is having no active symptoms.      No further cardiovascular testing is suggested.  She needs      continued risk reduction.  2. Obesity.  We discussed the need to lose weight with diet and      exercise.  I emphasized that she needs to get back to exercising 5      days a week and gave her a target heart rate.  3. Dyslipidemia.  Per Dr. Shelia Media with a goal LDL less  than 70 and HDL      greater than 50.  4. Hypertension.  Blood pressure is not controlled in the office, but      she reports it always is at home when she takes it in the morning.      She says it is 130s-120s over 1s.  I have asked her to keep a      blood pressure diary at different times of the day and to present      this to me and I would be happy to review it.  She may well have      white coat hypertension only.  5. Followup.  She wants to be seen in 6 months.     Minus Breeding, MD, [facility]  Electronically Signed    JH/MedQ  DD: 01/02/2008  DT: 01/02/2008  Job #: [id number]   cc:   Lynnell Chad. Shelia Media, M.D.  Jacelyn Pi, M.D.

## 2010-06-15 NOTE — Assessment & Plan Note (Signed)
OFFICE VISIT   [name], [name]  DOB:  Jun 24, 1927                                        January 04, 2010  CHART #:  [id number]   HISTORY:  The patient is status post coronary artery bypass grafting x5  done by Dr. Roxan Hockey on December 08, 2009.  The patient did well  postoperatively and was ultimately discharged to [facility].  The patient presents today for a 3-week followup visit.  She  says she is progressing well.  She is up ambulating using a walker  without difficulty.  She states she will get short of breath after  certain distance, stops and rest, and is able to continue on.  She  denies any fevers, nausea, vomiting, or opening or drainage from any of  her incision sites.  She does state that they did treat her for  pneumonia which was seen on an x-ray done at the facility on December 21, 2009, questioning mild bibasilar atelectasis with early infiltrate  on the left.  She was treated with 7 days of Augmentin.  They did do a  repeat an x-ray while in the facility on December 29, 2009, which shows  improved left lower lung infiltrate.  The patient states they did start  her on Lasix for a total of 7 days.  She does plan to go home this  coming Thursday.  She states she has a followup appointment to see Dr.  Percival Spanish on January 26, 2010.  She does state that they plan to send  home health physical therapy at time of discharge to home.  She is  tolerating diet well.  She is sleeping well at night.   PHYSICAL EXAMINATION:  Vital Signs:  Blood pressure 122/74, pulse of 82,  and O2 sat 97% on room air.  Respiratory:  Diminished breath sounds at  the left base.  Cardiac:  Regular rate and rhythm noted.  No murmur  noted.  Abdomen:  Benign.  Extremities:  No edema noted.  Incisions:  All clean, dry, and intact and healing well.   STUDIES:  The patient had a PA and lateral chest x-ray done today which  shows persistent elevation of the  left hemidiaphragm with associated  atelectasis and a mild to moderate effusion.   IMPRESSION AND PLAN:  The patient is progressing well status post  coronary artery bypass grafting.  I do agree with continuing the patient  on the Lasix for her left pleural effusion.  They plan to follow up with  an x-ray at this facility this coming Thursday and will bring the  patient back in 2 weeks with a repeat chest x-ray for followup of her  left pleural effusion.  I agree that on this coming Thursday if her  effusion persists, she is to continue the Lasix.  The patient is to  continue using her incentive spirometer.  She is instructed still no  heavy lifting over 10 pounds, no driving at this point until she is  stronger.  She is to continue ambulating 3-4 times per day.  She also  can keep her appointment with Dr. Percival Spanish at the end of December.  I do  agree that the patient is ready for discharge to home this coming  Thursday and do agree that she should continue to have  home health  physical therapy.  Once home health physical therapy feels that the  patient is stable, we do recommend going outpatient rehab.  Again, we  will plan to follow the patient in 2 weeks with a PA and lateral chest x-  ray for followup of her left pleural effusion.  The patient instructed  in the interim if she has any surgical issues, she is to contact us.  The patient is in agreement.   Iverson Alamin, PA   KMD/MEDQ  D:  01/04/2010  T:  01/05/2010  Job:  [id number]   cc:   Minus Breeding, MD, [facility]

## 2010-06-15 NOTE — Assessment & Plan Note (Signed)
[facility]                            CARDIOLOGY OFFICE NOTE   VERBENA, HEFFINGTON                         MRN:          [id number]  DATE:01/16/2007                            DOB:          May 10, 1927    PRIMARY:  Dr. Deland Pretty.   REASON FOR PRESENTATION:  Evaluate patient with coronary disease.   HISTORY OF PRESENT ILLNESS:  This is the yearly followup for this  pleasant patient who is now 77 years old.  She states she has been doing  well except for an episode of hand clumsiness some weeks ago.  She had  an evaluation in Dr. Pennie Banter office.  She had a CT and a carotid  Doppler.  There was no clear etiology to this event.  She was  temporarily put on Plavix.  From a cardiovascular standpoint she  exercises 3 days a week and she does not get chest discomfort, neck or  arm discomfort.  She does not have any palpitations, presyncope or  syncope.  She does not have any PND or orthopnea.   She was recently started on a cholesterol drug but does not remember the  name of this either.  Dr. Maudie Mercury did this appropriately.   PAST MEDICAL HISTORY:  1. Coronary artery disease (30% left main disease, 30% LAD disease,      mid 70% LAD stenosis, discreet 95% mid to distal stenosis, first      diagonal 40% stenosis, mid AV groove circumflex 30% stenosis, first      obtuse marginal 30% stenosis, second obtuse marginal 75% stenosis;      the right coronary artery was occluded, it was stented with a drug-      eluting stent revealing a downstream 80% lesion which was also      stented).  2. Well-preserved ejection fraction.  3. Diabetes mellitus.  4. Hypertension.   ALLERGIES:  NONE.   MEDICATIONS:  1. Glucophage 1000 mg b.i.d.  2. Glucotrol 10 mg daily.  3. Lopressor 100 mg b.i.d.  4. Lotrel 10/40 daily.  5. Folic acid.  6. Aspirin 162 mg daily.   REVIEW OF SYSTEMS:  As stated in the HPI and otherwise negative for  other systems.   PHYSICAL  EXAMINATION:  Patient is in no distress, blood pressure 198/98,  heart rate 72 and regular, weight 171 pounds, body mass index 31.  HEENT:  Eyelids unremarkable, pupils equally round and reactive to  light, fundi not visualized, oral mucosa unremarkable.  NECK:  No jugular venous distention, wave form within normal limits,  carotid upstroke brisk and symmetric, no bruits, no thyromegaly.  LYMPHATICS:  No cervical, axillary or inguinal adenopathy.  LUNGS:  Clear to auscultation bilaterally.  BACK:  No costovertebral angle tenderness.  CHEST:  Unremarkable.  HEART:  PMI not displaced or sustained, S1 and S2 within normal limits,  no S3, no S4, no clicks, no rubs, no murmurs.  ABDOMEN:  Flat, positive bowel sounds normal in frequency and pitch, no  bruits, no rebound, no guarding, no midline pulsatile mass, no  hepatomegaly, splenomegaly.  SKIN:  No rashes, no nodules.  EXTREMITIES:  With 2+ pulses, no edema.   EKG sinus rhythm, rate 72, axis within normal limits, intervals within  normal limits, nonspecific inferior ST changes.   ASSESSMENT/PLAN:  1. Coronary disease, patient is having no ongoing symptoms consistent      with angina.  No further cardiovascular testing is suggested.  She      should continue with risk reduction.  2. Hand clumsiness.  She had an extensive evaluation by her primary      care physician.  Not clear of the etiology of this episode.  She      will remain on the aspirin.  Though she would not need Plavix from      a coronary artery standpoint, I will defer to Dr. Shelia Media as to      whether she might benefit with this drug from a neurologic      standpoint.  I have a low threshold to restart Plavix with these      symptoms.  3. Hypertension.  Blood pressure is not well-controlled.  Some of that      is she says white coat.  However, I do believe she needs tighter      control with goals of 130/85 or less.  I will add      hydrochlorothiazide 12.5 mg daily.   Will have to watch her sugars.      She will get a BMET in 2 weeks.  4. Dyslipidemia, the patient reports a good lipid profile.  I agree      with starting a statin based on the results of the Heart Protection      Study.  This was apparently done by Dr. Maudie Mercury and she will call back      and let us know which drug she is on.  Her goal LDL will be less      than 70 and HDL greater than 50.  5. Followup.  As I am making a change to her medicines I would see her      in 6 months rather than the usual 12.     Minus Breeding, MD, [facility]  Electronically Signed    JH/MedQ  DD: 01/16/2007  DT: 01/16/2007  Job #: [id number]   cc:   Lynnell Chad. Shelia Media, M.D.

## 2010-06-16 ENCOUNTER — Encounter (HOSPITAL_COMMUNITY): Payer: Self-pay

## 2010-06-17 ENCOUNTER — Ambulatory Visit (INDEPENDENT_AMBULATORY_CARE_PROVIDER_SITE_OTHER): Payer: Medicare Other | Admitting: Cardiology

## 2010-06-17 ENCOUNTER — Encounter: Payer: Self-pay | Admitting: Cardiology

## 2010-06-17 VITALS — BP 170/86 | HR 82 | Resp 18 | Ht 64.0 in | Wt 148.0 lb

## 2010-06-17 DIAGNOSIS — R0602 Shortness of breath: Secondary | ICD-10-CM

## 2010-06-17 DIAGNOSIS — R55 Syncope and collapse: Secondary | ICD-10-CM

## 2010-06-17 DIAGNOSIS — I428 Other cardiomyopathies: Secondary | ICD-10-CM

## 2010-06-17 DIAGNOSIS — E785 Hyperlipidemia, unspecified: Secondary | ICD-10-CM

## 2010-06-17 DIAGNOSIS — I10 Essential (primary) hypertension: Secondary | ICD-10-CM

## 2010-06-17 DIAGNOSIS — I2510 Atherosclerotic heart disease of native coronary artery without angina pectoris: Secondary | ICD-10-CM

## 2010-06-17 NOTE — Assessment & Plan Note (Signed)
The patient has no new sypmtoms.  No further cardiovascular testing is indicated.  We will continue with aggressive risk reduction and meds as listed. She can restart rehab when she is done with PT.

## 2010-06-17 NOTE — Assessment & Plan Note (Signed)
She has had no presyncope or other symptoms. No further evaluation is planned. Of note change in therapy is indicated.

## 2010-06-17 NOTE — Assessment & Plan Note (Signed)
Although her blood pressure was elevated today he is not at home when checked by the home nurse. Therefore, I will not change her medications at this time.

## 2010-06-17 NOTE — Progress Notes (Signed)
HPI The patient presents for followup after hospitalization with questionable syncope in an arm fracture.  She has been doing well since discharge. She is doing physical therapy. She still has some arm soreness but no cardiac complaints. She is not sure she actually did pass out. We did reduce her beta blocker dose of that appointment. She has had no palpitations or presyncope. She's had no chest pressure or neck or arm discomfort. She's had no shortness of breath, PND or orthopnea.  No Known Allergies  Current Outpatient Prescriptions  Medication Sig Dispense Refill  . amLODipine (NORVASC) 10 MG tablet Take 10 mg by mouth daily.        Marland Kitchen aspirin 81 MG tablet Take 81 mg by mouth daily.        Marland Kitchen glipiZIDE (GLUCOTROL) 5 MG tablet 1 by mouth qam       . isosorbide mononitrate (IMDUR) 60 MG 24 hr tablet Take 60 mg by mouth daily.        Marland Kitchen losartan (COZAAR) 100 MG tablet Take 50 mg by mouth daily.       . metFORMIN (GLUCOPHAGE) 1000 MG tablet Take 1,000 mg by mouth. 1 qam and 1/2 q pm      . metoprolol (LOPRESSOR) 100 MG tablet Take 50 mg by mouth 2 (two) times daily.       Marland Kitchen DISCONTD: ALPRAZolam (XANAX) 0.25 MG tablet Use as directed       . DISCONTD: furosemide (LASIX) 40 MG tablet Take 40 mg by mouth daily.        Marland Kitchen DISCONTD: omeprazole (PRILOSEC) 20 MG capsule Take 20 mg by mouth daily.        Marland Kitchen DISCONTD: potassium chloride (KLOR-CON) 8 MEQ CR tablet Take 8 mEq by mouth daily.        Marland Kitchen DISCONTD: traMADol (ULTRAM) 50 MG tablet Use as directed         Past Medical History  Diagnosis Date  . CAD (coronary artery disease)   . Shortness of breath   . HTN (hypertension)   . DM (diabetes mellitus)   . Pleural effusion     Past Surgical History  Procedure Date  . Bilateral cataract surgery   . Coronary artery bypass graft     ROS:  As stated in the HPI and negative for all other systems.  PHYSICAL EXAM BP 170/86  Pulse 82  Resp 18  Ht 5\' 4"  (1.626 m)  Wt 148 lb (67.132 kg)  BMI  25.40 kg/m2 GENERAL:  Well appearing HEENT:  Pupils equal round and reactive, fundi not visualized, oral mucosa unremarkable NECK:  No jugular venous distention, waveform within normal limits, carotid upstroke brisk and symmetric, no bruits, no thyromegaly LYMPHATICS:  No cervical, inguinal adenopathy LUNGS:  Clear to auscultation bilaterally BACK:  No CVA tenderness CHEST:  Well healed sternotomy scar. HEART:  PMI not displaced or sustained,S1 and S2 within normal limits, no S3, no S4, no clicks, no rubs, no murmurs ABD:  Flat, positive bowel sounds normal in frequency in pitch, no bruits, no rebound, no guarding, no midline pulsatile mass, no hepatomegaly, no splenomegaly EXT:  2 plus pulses throughout, no edema, no cyanosis no clubbing SKIN:  No rashes no nodules NEURO:  Cranial nerves II through XII grossly intact, motor grossly intact throughout PSYCH:  Cognitively intact, oriented to person place and time   ASSESSMENT AND PLAN

## 2010-06-17 NOTE — Assessment & Plan Note (Signed)
I will defer management of this to Dr. Shelia Media.

## 2010-06-17 NOTE — Patient Instructions (Signed)
Your physician recommends that you schedule a follow-up appointment in 3 MONTHS. 

## 2010-06-18 ENCOUNTER — Encounter (HOSPITAL_COMMUNITY): Payer: Self-pay

## 2010-06-18 NOTE — Discharge Summary (Signed)
NAME:  [name], [name] NO.:  [id number]   MEDICAL RECORD NO.:  [id number]                   PATIENT TYPE:  INP   LOCATION:  2027                                 FACILITY:  [facility]   PHYSICIAN:  Minus Breeding, M.D.                DATE OF BIRTH:  January 14, 1928   DATE OF ADMISSION:  01/02/2003  DATE OF DISCHARGE:  01/06/2003                           DISCHARGE SUMMARY - REFERRING   PROCEDURE:  Emergent coronary artery stenting December 2.   REASON FOR ADMISSION:  Please refer to dictated admission note.   LABORATORY DATA:  Hemoglobin 16 on admission, normal WBC and platelets.  Electrolytes and renal function remained normal throughout.  Glucose 325 on  admission.  Mildly elevated AST 44, otherwise normal liver enzymes.  Hemoglobin A1C 10.1.  Cardiac enzymes:  Peak CPK 815/72 (8.8), peak troponin  I 9.85.  Lipid profile:  Total cholesterol 130, triglyceride 100, HDL 35,  LDL 75 (cholesterol/HDL ratio 3.7).   Admission chest x-ray:  No active disease.   HOSPITAL COURSE:  Following presentation to the emergency room with  complaint of new onset chest pain, patient was found to have  electrocardiographic evidence of inferior myocardial infarction and  following stabilization was taken directly to the catheterization  laboratory.   Procedure performed by Junious Silk, M.D. [facility] (see report for full  details) revealed subtotal occlusion of the mid RCA as well as an 80% RCA  lesion.  Both stenoses were successfully dilated with placement of Taxus  stents with reduction to 0% residual stenosis and restoration of TIMI 3  flow.  Residual anatomy notable for 95% distal LAD lesion and 75% second  obtuse marginal.   Following review of films with colleagues, recommendation was to treat the  residual disease with aggressive medical therapy.  The patient will  therefore be maintained on Plavix indefinitely.   [name] postoperative course remained benign.   She had no recurrent  angina pectoris and was ambulating without complaints of chest discomfort.   Medications were adjusted and beta blocker was up titrated to the discharge  dose of 75 a.m./50 mg q.p.m.   Additionally, patient did present with history of type 2 diabetes mellitus  and was referred to the [facility] Internal Medicine service for management.  She was found to have poor diabetic control and additional medications were  added by Hilliard Clark A. Loanne Drilling, M.D. [facility] with the addition of Glucotrol and Actos.  Hemoglobin A1C was significantly elevated (10.1).   The patient was also started on aggressive cholesterol lowering therapy with  Zocor 40 mg daily.  The patient was referred for diabetic teaching at time  of discharge.  Outpatient followup has been arranged.   At time of discharge recommendation by Minus Breeding, M.D. was also made  regarding further adjustment of medication regimen with substitution of  Plendil with low dose Altace.  The patient will need a follow-up  BMET in two  weeks.  The patient was also counseled regarding significant adverse  reactions (i.e., angioedema, cough).   DISCHARGE MEDICATIONS:  1. Plavix 75 mg daily.  2. Coated aspirin 325 mg daily.  3. Altace 2.5 mg daily.  4. Zocor 40 mg q.h.s.  5. Glucophage 1 g b.i.d.  6. Glucotrol XL 10 mg daily.  7. Actos 45 mg daily.  8. Lopressor 75 mg q.a.m./50 mg q.p.m.  9. Nitrostat 0.4 mg p.r.n.   DISCHARGE INSTRUCTIONS:  Discontinue taking Plendil.  Refrain from any heavy  lifting or strenuous activity until seen by a physician.  No driving x1  week.  Low fat, low cholesterol, low salt diabetic diet.  Call the office if  there is any swelling/bleeding of the groin.   The patient is scheduled to follow up with Minus Breeding, M.D. Tuesday,  December 28 at 2:15 p.m.  She will have a followup metabolic profile at that  time.  The patient will also return for followup with her primary care  physician, Lynnell Chad.  Shelia Media, M.D.  Arrangements will be made through his  office.   DISCHARGE DIAGNOSES:  1. Status post acute inferior myocardial infarction.     a. Emergent stent (Taxus) mid/distal right coronary artery on January 02, 2003.     b. Residual distal left anterior descending/second obtuse marginal        disease treated medically.     c. Mild left ventricular dysfunction (ejection fraction 51%).  2. Dyslipidemia.  3. Type 2 diabetes mellitus.  4. Normocytic anemia.  5. History of hypertension.      Gene Serpe, P.A. [facility]                      Minus Breeding, M.D.    GS/MEDQ  D:  01/06/2003  T:  01/06/2003  Job:  [id number]   cc:   Lynnell Chad. Shelia Media, M.D.  [address] [place]  [place] [address]  Fax: [phone]

## 2010-06-18 NOTE — H&P (Signed)
NAME:  [name], [name] NO.:  [id number]   MEDICAL RECORD NO.:  [id number]                   PATIENT TYPE:  INP   LOCATION:  1831                                 FACILITY:  [facility]   PHYSICIAN:  Minus Breeding, M.D.                DATE OF BIRTH:  03/01/1927   DATE OF ADMISSION:  01/02/2003  DATE OF DISCHARGE:                                HISTORY & PHYSICAL   PRIMARY CARE PHYSICIAN:  Lynnell Chad. Shelia Media, M.D.   REASON FOR ADMISSION:  Chest pain.   HISTORY OF PRESENT ILLNESS:  This patient is a very pleasant 77 year old  African American female with no known prior history of coronary disease.  She had been having some chest discomfort with nausea yesterday but this was  mild.  She had chest tightness last night which was more severe.  However,  she was able to go to bed around midnight and the pain eased.  She awoke  early this morning pain-free.  However, at 7 a.m. at rest she developed  substernal chest discomfort with nausea.  She had no radiation to her neck  or to her arms.  There was no associated shortness of breath.  She did not  vomit.  She had no diaphoresis or palpitations.  The discomfort was  persistent and she was encouraged to come to the emergency room by her son  who is a physician.  There she was noted to have inferior ST segment  elevation in II, III and aVF approximately 2 mm.  She was hypertensive.   PAST MEDICAL HISTORY:  1. Diabetes mellitus times eight years.  2. Hypertension times approximately eight years.   PAST SURGICAL HISTORY:  None.   ALLERGIES:  None.   MEDICATIONS:  1. Plendil 5 mg daily.  2. Glucophage 850 mg b.i.d.  3. Aspirin 81 mg daily.   SOCIAL HISTORY:  She lives in [place] having just relocated here from  [place].  She is retired from [employer].  She has never smoked  cigarettes.  She has two children and no grandchildren.   FAMILY HISTORY:  Noncontributory for early coronary artery disease.   REVIEW OF SYSTEMS:  As stated in the HPI and negative for all other systems.   PHYSICAL EXAMINATION:  GENERAL:  The patient is in no distress.  VITAL SIGNS:  Blood pressure 191/98, heart rate 103 and regular, afebrile.  HEENT:  Eyes unremarkable.  Pupils are equal, round and reactive to light.  Fundi not visualized.  NECK:  No jugular venous distension.  ____________ within normal limits.  Carotid upstroke brisk and symmetric, no bruits.  No thyromegaly.  LYMPHATICS:  No cervical, axillary, inguinal adenopathy.  LUNGS:  Clear to auscultation bilaterally.  BACK:  No costovertebral angle tenderness.  CHEST:  Unremarkable.  HEART:  PMI not displaced or sustained.  S1 and S2 within normal limits.  No  S3.  No  S4.  No murmurs.  ABDOMEN:  Flat, positive bowel sounds.  Normal in frequency and pitch.  No  bruits.  No rebound, no guarding in the midline, no hepatomegaly, no  splenomegaly.  SKIN:  No rash.  EXTREMITIES:  2+ pulses throughout, no edema, no cyanosis or clubbing.  NEUROLOGIC:  Oriented to person, place and time.  Cranial nerves II-XII  grossly intact.  Motor grossly intact.   LABORATORY DATA:  EKG shows sinus rhythm, rate 86, axis within normal  limits, intervals within normal limits, 1 mm ST segment elevation in II,  III, and aVF with slightly inverted T waves.  Creatinine 0.9, glucose 317,  BUN 14, sodium 136, potassium 4.2.  Hemoglobin 16.  CK-MB 20.1, TNI 5.72.   ASSESSMENT AND PLAN:  1. Inferior myocardial infarction.  The patient had an acute inferior     myocardial infarction, pain starting severely this morning.  She will be     taken urgently to the cardiac catheterization lab.  She is currently on     heparin and is managed with beta blockers and aspirin.  2. Risk reduction.  She will have a lipid profile and be started empirically     on a statin.  3. Diabetes.  She will be continued on her outpatient therapy, holding     Glucophage for 48 hours after the  catheterization.  She will need sliding     scale coverage.  Will check the hemoglobin A1C.                                                Minus Breeding, M.D.    JH/MEDQ  D:  01/02/2003  T:  01/02/2003  Job:  [id number]

## 2010-06-18 NOTE — Assessment & Plan Note (Signed)
[facility]                            CARDIOLOGY OFFICE NOTE   [name], [name]                         MRN:          [id number]  DATE:01/13/2006                            DOB:          [birth year]    PRIMARY CARE PHYSICIAN:  Lynnell Chad. Shelia Media, M.D.   REASON FOR PRESENTATION:  Patient with coronary disease.   HISTORY OF PRESENT ILLNESS:  The patient is a pleasant, 77 year old  female with coronary disease, as described below.  She had been doing  well since I last saw her.  Her blood pressure was up a little bit at  the last visit and is again today.  However, she does get it checked  routinely at cardiac rehab maintenance and has not had any elevated  pressures.  I reviewed these and this is true.  She denies any chest  pressure, neck discomfort nor arm discomfort.  She has no shortness of  breath and denies any PND or orthopnea.  She has had no palpitations,  presyncope or syncope.   PAST MEDICAL HISTORY:  Coronary artery disease (30% left main stenosis,  30% ostial stenosis, followed by diffuse 70% stenosis of the distal LAD,  the bifurcation had discrete 95% stenosis, there was a first diagonal  40% stenosis of the ostium of the circumflex, 30% in the mid-vessel;  second obtuse marginal with 75% stenosis; the right coronary artery is  occluded.  The patient had a drug eluting stent to the right coronary  artery, which revealed an 80% lesion more distal that was also stented).  Diabetes mellitus.  Hypertension.   ALLERGIES:  None.   MEDICATIONS:  1. Glucophage 1000 mg b.i.d.  2. Glucotrol 10 mg a day.  3. Actos 45 mg daily.  4. Lopressor 100 mg b.i.d.  5. Lotrel 10/40 a day.  6. Folic acid.  7. Aspirin 162 mg daily.   REVIEW OF SYSTEMS:  As stated in the HPI.  Otherwise negative for other  systems.   PHYSICAL EXAMINATION:  The patient is in no distress with blood pressure  178/82, heart rate 72 and regular, weight 182 pounds, body  mass index  31.  HEENT:  Eyes are unremarkable.  Pupils were equal, round and reactive to  light.  Fundi not visualized.  Oral mucosa unremarkable.  NECK:  No jugular venous distention at 45 degrees.  Carotid upstroke  brisk and symmetric.  No bruits.  No thyromegaly.  LYMPHATICS:  No lymphadenopathy.  LUNGS:  Clear to auscultation bilaterally.  BACK:  No costovertebral angle tenderness.  CHEST:  Unremarkable.  HEART:  PMI not displaced or sustained.  S1 and S2 within normal limits.  No S3, no S4, no murmurs.  ABDOMEN:  Mildly obese, positive bowel sounds, normal in frequency and  pitch, no bruits, rebound, burning, no midline pulses, no masses, no  organomegaly.  SKIN:  No rashes, no nodules.  EXTREMITIES:  2+ pulses, no edema.   EKG:  Sinus rhythm, rate 72, leftward axis, intervals within normal  limits, no acute STT-wave changes, poor anterior R-wave progression, no  change from previous  electrocardiograms.   ASSESSMENT AND PLAN:  1. Coronary disease:  The patient is having no ongoing symptoms.  She      is participating very nicely in secondary risk reduction.  No      further cardiovascular testing is suggested.  2. Hypertension:  I have reviewed readings from cardiac rehab and I am      in communication with the rehab folks.  I am not hearing about      elevated blood pressures, either at the beginning or after      exercise.  Therefore, I will not make any change in her medical      regimen.  If she works with Dr. Shelia Media to lose weight, she will      probably even have better blood pressure control.  3. Followup:  I would like to see her in one year or sooner, if      needed.     Minus Breeding, MD, [facility]  Electronically Signed    JH/MedQ  DD: 01/13/2006  DT: 01/13/2006  Job #: [id number]   cc:   Lynnell Chad. Shelia Media, M.D.

## 2010-06-18 NOTE — Assessment & Plan Note (Signed)
[place]                                 ON-CALL NOTE   CHARDA, DEVOR                         MRN:          [id number]  DATE:07/21/2007                            DOB:          April 15, 1927    PRIMARY CARE PHYSICIAN:  Lynnell Chad. Shelia Media, MD   PRIMARY CARDIOLOGIST:  Minus Breeding, MD, [facility]   HISTORY:  Ms. Rusher is a 77 year old female who is followed by Dr.  Percival Spanish.  She states that she contacted [facility] on Thursday to receive  a refill of her Lotrel 10/40 mg.  They stated that they would contact  the office for refill authorization, and in the meantime, she received 3  tablets.  However, apparently, the office did not either receive the  request or has not called in the authorization.  Thus, Ms. Arrants calls  today stating that she needs her medication refilled.   I have contacted [facility] on [address] at [phone] and left a message  in regards to Verlon Au on refill authorization for her Lotrel 10/40  one p.o. daily authorized 6 refills with a 68-month supply.      Sharyl Nimrod, PA-C  Electronically Signed      Cristopher Estimable. Lattie Haw, MD, [facility]  Electronically Signed   EW/MedQ  DD: 07/21/2007  DT: 07/21/2007  Job #: [id number]

## 2010-06-18 NOTE — Cardiovascular Report (Signed)
NAME:  [name], [name] NO.:  [id number]   MEDICAL RECORD NO.:  [id number]                   PATIENT TYPE:  INP   LOCATION:  2925                                 FACILITY:  [place]   PHYSICIAN:  Junious Silk, M.D. [facility]         DATE OF BIRTH:  1927-05-02   DATE OF PROCEDURE:  01/02/2003  DATE OF DISCHARGE:                              CARDIAC CATHETERIZATION   PROCEDURE PERFORMED:  1. Left heart catheterization with coronary angiography and left     ventriculography.  2. Percutaneous transluminal coronary angioplasty with placement of a drug-     eluting stent in the distal right coronary artery at the acute margin.  3. Percutaneous transluminal coronary angioplasty with drug-eluting stent     placement in the distal right coronary artery just proximal to the     posterior descending artery.   INDICATION:  [name] is a 77 year old woman with diabetes and hypertension.  She developed the onset of chest pain last night which subsequently  resolved.  She had recurrent chest pain at 7 this morning and ultimately  presented to the emergency room approximately three hours later.  After  initial treatment in the emergency room, she became pain free.  EKG did show  a mild inferior ST segment elevation and she had elevated cardiac markers  consistent with an acute myocardial infarction.  She was therefore brought  emergently to the cardiac catheterization laboratory.   CATHETERIZATION PROCEDURAL NOTE:  A 6 French sheath was placed in the right  femoral artery.  Coronary angiography was performed with 6 [place] JL-4 and  JR-4 catheters.  Left ventriculography was performed with an angled pigtail  catheter.  Contrast was Omnipaque.  There were no complications.   RESULTS:   HEMODYNAMICS:  1. Left ventricular pressure 138/8.  2. Aortic pressure 138/78.  3. There is no aortic valve gradient.   LEFT VENTRICULOGRAM:  There is severe akinesis of the inferior  wall.  Ejection fraction is calculated at 51%.  There is no mitral regurgitation.   CORONARY ARTERIOGRAPHY (RIGHT DOMINANT):  Left main has a distal 30%  stenosis.   Left anterior descending artery has a 30% stenosis in the ostium.  The mid  LAD has a diffuse 70% stenosis.  In the distal LAD just after the  bifurcation of a second diagonal branch, there is a discrete 95% stenosis.  The LAD gives rise to a normal size first diagonal branch which has a 40%  stenosis at its ostium.  There is also a normal size second diagonal branch.   Left circumflex has as 30% stenosis in the mid vessel.  The circumflex gives  rise to a normal size first obtuse marginal and a normal size second obtuse  marginal.  The first obtuse marginal has a diffuse 30% stenosis.  The second  obtuse marginal has a tubular 75% stenosis.   Right coronary artery is a dominant vessel.  There is a 30% stenosis in the  mid right coronary artery.  In the distal right coronary artery at the acute  margin, there is a 99% stenosis with thrombus and TIMI-2 flow.  Further down  in the distal right coronary artery just prior to the bifurcation of the  posterior descending artery there is an 80% stenosis.  The posterior  descending artery itself is large and has an 80% stenosis in the mid vessel.  The distal right coronary artery also gives rise to a small posterior  lateral branch.   IMPRESSION:  1. Mildly decreased left ventricular systolic function secondary to acute     inferior wall myocardial infarction.  2. Three-vessel coronary artery disease.  The culprit is the 99% stenosis in     the distal right coronary artery at the acute margin with TIMI-2 flow     beyond this.  There is also significant disease in the left anterior     descending as well as moderate disease in the circumflex marginal and the     posterior descending artery.   PLAN:  These findings were reviewed with Dr. Percival Spanish.  As the patient had   compromised flow in the right coronary artery, we opted to proceed with  immediate percutaneous coronary intervention of the right coronary artery,  see below.   PERCUTANEOUS TRANSLUMINAL CORONARY ANGIOPLASTY PROCEDURE NOTE:  We utilized  pre-existing 6 French sheath in the right femoral artery.  Angiomax was  administered per protocol as the patient was enrolled in the [facility] trial.  We used a 6 [place] JR-4 guiding catheter.  An IQ coronary guide wire  catheter was advanced beyond the stenosis in the vessel and positioned in  the distal right coronary artery.  We then preformed percutaneous  transluminal coronary angioplasty of the lesion at the acute margin with a  2.5 x 15-mm Maverick balloon which was inflated to 8 atmospheres.  This did  result in improved perfusion in the distal vessel.  However, there is  significant residual stenosis and thrombus.  We therefore positioned a 2.5 x  20-mm Taxus drug-eluting stent across the lesion at the acute margin and  deployed the stent at 13 atmospheres.  We then went back with a 2.75 x 15-mm  Quantum balloon positioning it in the mid aspect of the stent and inflating  it to 16 atmospheres.  Angiographic images at that point revealed an  excellent result at this stent site with 0% residual stenosis and TIMI-3  flow.  There is mild disease proximal to this stent, moderate disease just  distal to the stent.  However, this did not appear to be flow-limiting.  Following this, we turned our attention to the distal right coronary artery  just proximal to the posterior descending artery where there was an 80%  stenosis.  We repositioned our coronary guide wire into the distal portion  of the posterior descending artery.  We then positioned a 2.5 x 12-mm Taxus  drug-eluting stent across this area of disease and deployed the stent at 11  atmospheres.  We then went back in with a 2.75 x 8-mm Quantum balloon in the proximal aspect of the stent and inflated  it to 18 atmospheres.  Final  angiographic images were then obtained revealing patency of the right  coronary artery with 0% residual stenosis at both stent sites and TIMI-3  flow into the distal vessel.   COMPLICATIONS:  None.   RESULTS:  1. Successful percutaneous transluminal coronary angioplasty  with placement     of a drug-eluting stent in the distal right coronary artery at the acute     margin.  A 99% stenosis with thrombus and TIMI-2 flow was reduced to 0%     residual with TIMI-3 flow.  2. Successful placement of a drug-eluting stent in the distal right coronary     artery just proximal to the posterior descending artery.  An 80% stenosis     was reduced to 0% residual with TIMI-3 flow.   PLAN:  Angiomax will be discontinued at this point.  The patient will be  started on Plavix which is recommended to be continued for six to nine  months.  We will review her anatomy further with colleagues to determine  whether staged percutaneous coronary intervention of the left anterior  descending artery versus coronary artery bypass surgery would be appropriate  long term therapy.                                               Junious Silk, M.D. [facility]    MWP/MEDQ  D:  01/02/2003  T:  01/02/2003  Job:  [id number]   cc:   Dr. Concha Pyo

## 2010-06-18 NOTE — Consult Note (Signed)
NAME:  [name], [name]                            ACCOUNT NO.:  [id number]   MEDICAL RECORD NO.:  [id number]                   PATIENT TYPE:  INP   LOCATION:                                       FACILITY:  [place]   PHYSICIAN:  Sean A. Loanne Drilling, M.D. [facility]           DATE OF BIRTH:  03/19/1927   DATE OF CONSULTATION:  01/05/2003  DATE OF DISCHARGE:  01/06/2003                                   CONSULTATION   REFERRING PHYSICIAN:  Loretha Brasil. Lia Foyer, M.D., of [facility].   REASON FOR REFERRAL:  Diabetes.   HISTORY OF PRESENT ILLNESS:  The patient is a 77 year old woman admitted two  days ago with an acute myocardial infarction and a coronary stent was  urgently placed.  She was found to have a glucose above 300 and she has been  getting some p.r.n. subcutaneous Novolog for this.   With respect to her diabetes, she states that she has only been taking  Glucophage 850 mg twice daily for some years and is not sure how well  controlled her glucoses are at home.  The patient states that she was  diagnosed recently with diabetic retinopathy.   PAST MEDICAL HISTORY:  Hypertension.   MEDICATIONS AT HOME:  1. Plendil 5 mg a day.  2. Glucophage 850 mg twice daily.  3. Aspirin 81 mg daily.   FAMILY HISTORY:  Positive for diabetes in her father.   REVIEW OF SYSTEMS:  Denies weight loss, acral numbness, and polyuria.   PHYSICAL EXAMINATION:  VITAL SIGNS:  The blood pressure is 130/65, the heart  rate is 85, the respiratory rate is 60, and she is afebrile.  GENERAL APPEARANCE:  No distress.  CHEST:  Clear to auscultation.  CARDIOVASCULAR:  No JVD.  No edema.  Regular rate and rhythm.  No murmur.  Pedal pulses are intact.  EXTREMITIES:  Feet with normal color and temperature.  There is no ulcer  present on the feet.  NEUROLOGIC:  Alert and well oriented.  Sensation is intact to touch on the  feet.   LABORATORY STUDIES:  On January 03, 2003, CBC normal, except for a  hemoglobin of 11.0.   BM18 normal, except for glucose 254, specifically, BUN  8 and creatinine 0.9.   IMPRESSION:  1. Type 2 diabetes complicated by coronary disease and diabetic retinopathy.  2. Other chronic medical problems as noted in the history and physical.   PLAN:  1. I discussed with the patient the need to restore normal glycemia acutely     and chronically.  2. Will try triple oral therapy for now with supplement p.r.n. Novolog.     This would include Glucotrol XL, Glucophage, and Actos.  3. I will follow the patient in the hospital and will assume care as your     primary care physician when she goes home.  Sean A. Loanne Drilling, M.D. [facility]    SAE/MEDQ  D:  01/04/2003  T:  01/05/2003  Job:  [id number]

## 2010-06-21 ENCOUNTER — Encounter (HOSPITAL_COMMUNITY): Payer: Self-pay

## 2010-06-23 ENCOUNTER — Encounter (HOSPITAL_COMMUNITY): Payer: Self-pay

## 2010-06-25 ENCOUNTER — Encounter (HOSPITAL_COMMUNITY): Payer: Self-pay

## 2010-06-28 ENCOUNTER — Encounter (HOSPITAL_COMMUNITY): Payer: Self-pay

## 2010-06-30 ENCOUNTER — Encounter (HOSPITAL_COMMUNITY): Payer: Self-pay

## 2010-07-02 ENCOUNTER — Encounter (HOSPITAL_COMMUNITY): Payer: Self-pay

## 2010-07-05 ENCOUNTER — Encounter (HOSPITAL_COMMUNITY): Payer: Self-pay

## 2010-07-07 ENCOUNTER — Encounter (HOSPITAL_COMMUNITY): Payer: Self-pay

## 2010-07-09 ENCOUNTER — Encounter (HOSPITAL_COMMUNITY): Payer: Self-pay

## 2010-07-12 ENCOUNTER — Encounter (HOSPITAL_COMMUNITY): Payer: Self-pay

## 2010-07-14 ENCOUNTER — Encounter (HOSPITAL_COMMUNITY): Payer: Self-pay

## 2010-07-15 ENCOUNTER — Other Ambulatory Visit: Payer: Self-pay | Admitting: *Deleted

## 2010-07-15 MED ORDER — ISOSORBIDE MONONITRATE ER 60 MG PO TB24: 60.0000 mg | ORAL_TABLET | Freq: Every day | ORAL | Status: DC

## 2010-07-16 ENCOUNTER — Encounter (HOSPITAL_COMMUNITY): Payer: Self-pay

## 2010-07-16 ENCOUNTER — Telehealth: Payer: Self-pay | Admitting: Cardiology

## 2010-07-16 MED ORDER — METOPROLOL TARTRATE 100 MG PO TABS: 50.0000 mg | ORAL_TABLET | Freq: Two times a day (BID) | ORAL | Status: DC

## 2010-07-16 MED ORDER — ISOSORBIDE MONONITRATE ER 60 MG PO TB24: 60.0000 mg | ORAL_TABLET | Freq: Every day | ORAL | Status: DC

## 2010-07-16 NOTE — Telephone Encounter (Signed)
Refill lopressor 50 mg and indur 60 mg, uses [facility]

## 2010-09-21 ENCOUNTER — Encounter: Payer: Self-pay | Admitting: Cardiology

## 2010-09-21 ENCOUNTER — Ambulatory Visit (INDEPENDENT_AMBULATORY_CARE_PROVIDER_SITE_OTHER): Payer: Medicare Other | Admitting: Cardiology

## 2010-09-21 DIAGNOSIS — E785 Hyperlipidemia, unspecified: Secondary | ICD-10-CM

## 2010-09-21 DIAGNOSIS — I2510 Atherosclerotic heart disease of native coronary artery without angina pectoris: Secondary | ICD-10-CM

## 2010-09-21 DIAGNOSIS — I10 Essential (primary) hypertension: Secondary | ICD-10-CM

## 2010-09-21 NOTE — Patient Instructions (Signed)
The current medical regimen is effective;  continue present plan and medications.  Follow up in 6 months with Dr Percival Spanish.  You will receive a letter in the mail 2 months before you are due.  Please call us when you receive this letter to schedule your follow up appointment.  Restart Cardiac Rehab.

## 2010-09-21 NOTE — Assessment & Plan Note (Signed)
Her blood pressure is SBP 130 at home.  No change in therapy is indicated.

## 2010-09-21 NOTE — Assessment & Plan Note (Signed)
The patient has no new sypmtoms.  No further cardiovascular testing is indicated.  We will continue with aggressive risk reduction and meds as listed.  She will restart rehab.

## 2010-09-21 NOTE — Assessment & Plan Note (Signed)
She reports that she is on a statin and was given samples by Dr. Shelia Media.  I will defer to his management.

## 2010-09-21 NOTE — Progress Notes (Signed)
.   HPI The patient presents for followup of her CAD.  Since I last saw her she has done well.  The patient denies any new symptoms such as chest discomfort, neck or arm discomfort. There has been no new shortness of breath, PND or orthopnea. There have been no reported palpitations, presyncope or syncope.  She is recovering from an arm injury and wants to restart cardiac rehab.     .No Known Allergies  Current Outpatient Prescriptions  Medication Sig Dispense Refill  . amLODipine (NORVASC) 10 MG tablet Take 10 mg by mouth daily.        Marland Kitchen aspirin 81 MG tablet Take 162 mg by mouth daily.       Marland Kitchen glipiZIDE (GLUCOTROL) 5 MG tablet 1 by mouth qam       . isosorbide mononitrate (IMDUR) 60 MG 24 hr tablet Take 1 tablet (60 mg total) by mouth daily.  60 tablet  12  . losartan (COZAAR) 100 MG tablet Take 50 mg by mouth daily.       . metFORMIN (GLUCOPHAGE) 1000 MG tablet Take 1,000 mg by mouth. 1 qam and 1/2 q pm      . metoprolol (LOPRESSOR) 100 MG tablet Take 0.5 tablets (50 mg total) by mouth 2 (two) times daily.  60 tablet  12    Past Medical History  Diagnosis Date  . Shortness of breath   . HTN (hypertension)   . DM (diabetes mellitus)   . Pleural effusion   . CAD (coronary artery disease)     status post stenting of the RCA status post CABG. (left internal mammary artery to  LAD, saphenous vein graft to second diagonal, saphenous vein  graft to obtuse marginal 1, saphenous vein graft to posterior  descending).  . Cardiomyopathy      Mildly reduced EF (45%).     Past Surgical History  Procedure Date  . Bilateral cataract surgery   . Coronary artery bypass graft     ROS:  As stated in the HPI and negative for all other systems.  PHYSICAL EXAM BP 157/96  Pulse 65  Ht 5\' 3"  (1.6 m)  Wt 146 lb (66.225 kg)  BMI 25.86 kg/m2 GENERAL:  Well appearing HEENT:  Pupils equal round and reactive, fundi not visualized, oral mucosa unremarkable NECK:  No jugular venous distention, waveform  within normal limits, carotid upstroke brisk and symmetric, no bruits, no thyromegaly LYMPHATICS:  No cervical, inguinal adenopathy LUNGS:  Clear to auscultation bilaterally BACK:  No CVA tenderness CHEST:  Well healed sternotomy scar. HEART:  PMI not displaced or sustained,S1 and S2 within normal limits, no S3, no S4, no clicks, no rubs, no murmurs ABD:  Flat, positive bowel sounds normal in frequency in pitch, no bruits, no rebound, no guarding, no midline pulsatile mass, no hepatomegaly, no splenomegaly EXT:  2 plus pulses throughout, no edema, no cyanosis no clubbing SKIN:  No rashes no nodules NEURO:  Cranial nerves II through XII grossly intact, motor grossly intact throughout PSYCH:  Cognitively intact, oriented to person place and time  EKG:  NSR, rate 66, LAD, poor anterior R wave progression, nonspecific ST T wave flattening.  ASSESSMENT AND PLAN

## 2010-11-08 ENCOUNTER — Encounter (HOSPITAL_COMMUNITY)
Admission: RE | Admit: 2010-11-08 | Discharge: 2010-11-08 | Disposition: A | Discharge status: Home / Self Care | Payer: Medicare Other | Source: Ambulatory Visit | Attending: Cardiology | Admitting: Cardiology

## 2010-11-08 ENCOUNTER — Other Ambulatory Visit: Payer: Self-pay | Admitting: Cardiology

## 2010-11-08 DIAGNOSIS — I2510 Atherosclerotic heart disease of native coronary artery without angina pectoris: Secondary | ICD-10-CM | POA: Insufficient documentation

## 2010-11-08 DIAGNOSIS — Z5189 Encounter for other specified aftercare: Secondary | ICD-10-CM | POA: Insufficient documentation

## 2010-11-08 DIAGNOSIS — E119 Type 2 diabetes mellitus without complications: Secondary | ICD-10-CM | POA: Insufficient documentation

## 2010-11-08 DIAGNOSIS — E785 Hyperlipidemia, unspecified: Secondary | ICD-10-CM | POA: Insufficient documentation

## 2010-11-08 DIAGNOSIS — I10 Essential (primary) hypertension: Secondary | ICD-10-CM | POA: Insufficient documentation

## 2010-11-10 ENCOUNTER — Encounter (HOSPITAL_COMMUNITY): Payer: Medicare Other

## 2010-11-10 ENCOUNTER — Other Ambulatory Visit: Payer: Self-pay | Admitting: Cardiology

## 2010-11-12 ENCOUNTER — Encounter (HOSPITAL_COMMUNITY): Payer: Medicare Other

## 2010-11-15 ENCOUNTER — Encounter (HOSPITAL_COMMUNITY): Payer: Medicare Other

## 2010-11-17 ENCOUNTER — Encounter (HOSPITAL_COMMUNITY): Payer: Medicare Other

## 2010-11-19 ENCOUNTER — Encounter (HOSPITAL_COMMUNITY): Payer: Medicare Other

## 2010-11-22 ENCOUNTER — Encounter (HOSPITAL_COMMUNITY): Payer: Medicare Other

## 2010-11-24 ENCOUNTER — Encounter (HOSPITAL_COMMUNITY): Payer: Medicare Other

## 2010-11-26 ENCOUNTER — Encounter (HOSPITAL_COMMUNITY): Payer: Medicare Other

## 2010-11-29 ENCOUNTER — Encounter (HOSPITAL_COMMUNITY): Payer: Medicare Other

## 2010-12-01 ENCOUNTER — Encounter (HOSPITAL_COMMUNITY): Payer: Medicare Other

## 2010-12-03 ENCOUNTER — Encounter (HOSPITAL_COMMUNITY): Payer: Medicare Other | Attending: Cardiology

## 2010-12-03 DIAGNOSIS — Z5189 Encounter for other specified aftercare: Secondary | ICD-10-CM | POA: Insufficient documentation

## 2010-12-03 DIAGNOSIS — E785 Hyperlipidemia, unspecified: Secondary | ICD-10-CM | POA: Insufficient documentation

## 2010-12-03 DIAGNOSIS — I10 Essential (primary) hypertension: Secondary | ICD-10-CM | POA: Insufficient documentation

## 2010-12-03 DIAGNOSIS — E119 Type 2 diabetes mellitus without complications: Secondary | ICD-10-CM | POA: Insufficient documentation

## 2010-12-03 DIAGNOSIS — I2510 Atherosclerotic heart disease of native coronary artery without angina pectoris: Secondary | ICD-10-CM | POA: Insufficient documentation

## 2010-12-06 ENCOUNTER — Encounter (HOSPITAL_COMMUNITY): Payer: Medicare Other

## 2010-12-06 NOTE — Progress Notes (Signed)
Pt inquired about the maintenance program after she graduates on Friday.  Pt would like to attend 11:15 maintenance class in December.  Pt will be placed on a waiting list due to class size.  Pt verbalized understanding.

## 2010-12-08 ENCOUNTER — Encounter (HOSPITAL_COMMUNITY): Payer: Medicare Other

## 2010-12-10 ENCOUNTER — Encounter (HOSPITAL_COMMUNITY): Payer: Medicare Other

## 2010-12-13 ENCOUNTER — Encounter (HOSPITAL_COMMUNITY): Payer: Medicare Other

## 2010-12-30 DIAGNOSIS — E113299 Type 2 diabetes mellitus with mild nonproliferative diabetic retinopathy without macular edema, unspecified eye: Secondary | ICD-10-CM | POA: Insufficient documentation

## 2010-12-30 DIAGNOSIS — H35349 Macular cyst, hole, or pseudohole, unspecified eye: Secondary | ICD-10-CM | POA: Insufficient documentation

## 2010-12-30 DIAGNOSIS — H40009 Preglaucoma, unspecified, unspecified eye: Secondary | ICD-10-CM | POA: Insufficient documentation

## 2011-02-06 ENCOUNTER — Other Ambulatory Visit: Payer: Self-pay | Admitting: Cardiology

## 2011-03-28 ENCOUNTER — Encounter: Payer: Self-pay | Admitting: Cardiology

## 2011-03-28 ENCOUNTER — Ambulatory Visit (INDEPENDENT_AMBULATORY_CARE_PROVIDER_SITE_OTHER): Payer: Medicare Other | Admitting: Cardiology

## 2011-03-28 VITALS — BP 120/80 | HR 69 | Ht 64.0 in | Wt 154.0 lb

## 2011-03-28 DIAGNOSIS — I2510 Atherosclerotic heart disease of native coronary artery without angina pectoris: Secondary | ICD-10-CM

## 2011-03-28 NOTE — Assessment & Plan Note (Signed)
The blood pressure is at target. No change in medications is indicated. We will continue with therapeutic lifestyle changes (TLC).  

## 2011-03-28 NOTE — Progress Notes (Signed)
   HPI The patient presents for followup of her CAD.  Since I last saw her she has done well.  The patient denies any new symptoms such as chest discomfort, neck or arm discomfort. There has been no new shortness of breath, PND or orthopnea. There have been no reported palpitations, presyncope or syncope. She was in [place] for several weeks and because of this and an injury to her arm cardiac rehabilitation had been interrupted. She wants to restart this. She has gained 10 pounds.  .No Known Allergies  Current Outpatient Prescriptions  Medication Sig Dispense Refill  . amLODipine (NORVASC) 10 MG tablet TAKE ONE TABLET BY MOUTH DAILY.  30 tablet  6  . aspirin 81 MG tablet Take 162 mg by mouth daily.       Marland Kitchen glipiZIDE (GLUCOTROL) 5 MG tablet 1 by mouth qam       . isosorbide mononitrate (IMDUR) 60 MG 24 hr tablet Take 1 tablet (60 mg total) by mouth daily.  60 tablet  12  . losartan (COZAAR) 100 MG tablet TAKE ONE TABLET BY MOUTH EVERY DAY  30 tablet  6  . metFORMIN (GLUCOPHAGE) 1000 MG tablet Take 1,000 mg by mouth. 1 qam and 1/2 q pm      . metoprolol (LOPRESSOR) 100 MG tablet Take 0.5 tablets (50 mg total) by mouth 2 (two) times daily.  60 tablet  12    Past Medical History  Diagnosis Date  . Shortness of breath   . HTN (hypertension)   . DM (diabetes mellitus)   . Pleural effusion   . CAD (coronary artery disease)     status post stenting of the RCA status post CABG. (left internal mammary artery to  LAD, saphenous vein graft to second diagonal, saphenous vein  graft to obtuse marginal 1, saphenous vein graft to posterior  descending).  . Cardiomyopathy      Mildly reduced EF (45%).     Past Surgical History  Procedure Date  . Bilateral cataract surgery   . Coronary artery bypass graft     ROS:  As stated in the HPI and negative for all other systems.  PHYSICAL EXAM BP 120/80  Pulse 69  Ht 5\' 4"  (1.626 m)  Wt 69.854 kg (154 lb)  BMI 26.43 kg/m2 GENERAL:  Well  appearing NECK:  No jugular venous distention, waveform within normal limits, carotid upstroke brisk and symmetric, no bruits, no thyromegaly LYMPHATICS:  No cervical, inguinal adenopathy LUNGS:  Clear to auscultation bilaterally BACK:  No CVA tenderness CHEST:  Well healed sternotomy scar. HEART:  PMI not displaced or sustained,S1 and S2 within normal limits, no S3, no S4, no clicks, no rubs, no murmurs ABD:  Flat, positive bowel sounds normal in frequency in pitch, no bruits, no rebound, no guarding, no midline pulsatile mass, no hepatomegaly, no splenomegaly EXT:  2 plus pulses throughout, trace right lower leg edema, no cyanosis no clubbing  EKG:  Sinus rhythm, rate 69, leftward axis, poor anterior R wave progression, inferolateral T wave inversions 03/28/2011   ASSESSMENT AND PLAN

## 2011-03-28 NOTE — Assessment & Plan Note (Signed)
I reviewed her lipids and they were well controlled and near target. I will make no change her regimen.

## 2011-03-28 NOTE — Patient Instructions (Signed)
The current medical regimen is effective;  continue present plan and medications.  Restart cardiac rehab  Follow up in 6 months with Dr Percival Spanish.  You will receive a letter in the mail 2 months before you are due.  Please call us when you receive this letter to schedule your follow up appointment.

## 2011-03-28 NOTE — Assessment & Plan Note (Addendum)
The patient has no new sypmtoms.  No further cardiovascular testing is indicated.  We will continue with aggressive risk reduction and meds as listed.  I would like for her to restart cardiac rehabilitation.

## 2011-05-02 ENCOUNTER — Encounter (HOSPITAL_COMMUNITY)
Admission: RE | Admit: 2011-05-02 | Discharge: 2011-05-02 | Disposition: A | Discharge status: Home / Self Care | Payer: Self-pay | Source: Ambulatory Visit | Attending: Cardiology | Admitting: Cardiology

## 2011-05-02 DIAGNOSIS — I10 Essential (primary) hypertension: Secondary | ICD-10-CM | POA: Insufficient documentation

## 2011-05-02 DIAGNOSIS — E119 Type 2 diabetes mellitus without complications: Secondary | ICD-10-CM | POA: Insufficient documentation

## 2011-05-02 DIAGNOSIS — E785 Hyperlipidemia, unspecified: Secondary | ICD-10-CM | POA: Insufficient documentation

## 2011-05-02 DIAGNOSIS — I2510 Atherosclerotic heart disease of native coronary artery without angina pectoris: Secondary | ICD-10-CM | POA: Insufficient documentation

## 2011-05-02 DIAGNOSIS — Z5189 Encounter for other specified aftercare: Secondary | ICD-10-CM | POA: Insufficient documentation

## 2011-05-04 ENCOUNTER — Encounter (HOSPITAL_COMMUNITY)
Admission: RE | Admit: 2011-05-04 | Discharge: 2011-05-04 | Disposition: A | Discharge status: Home / Self Care | Payer: Self-pay | Source: Ambulatory Visit | Attending: Cardiology | Admitting: Cardiology

## 2011-05-06 ENCOUNTER — Encounter (HOSPITAL_COMMUNITY)
Admission: RE | Admit: 2011-05-06 | Discharge: 2011-05-06 | Disposition: A | Discharge status: Home / Self Care | Payer: Self-pay | Source: Ambulatory Visit | Attending: Cardiology | Admitting: Cardiology

## 2011-05-09 ENCOUNTER — Encounter (HOSPITAL_COMMUNITY)
Admission: RE | Admit: 2011-05-09 | Discharge: 2011-05-09 | Disposition: A | Discharge status: Home / Self Care | Payer: Self-pay | Source: Ambulatory Visit | Attending: Cardiology | Admitting: Cardiology

## 2011-05-11 ENCOUNTER — Encounter (HOSPITAL_COMMUNITY): Payer: Self-pay

## 2011-05-13 ENCOUNTER — Encounter (HOSPITAL_COMMUNITY)
Admission: RE | Admit: 2011-05-13 | Discharge: 2011-05-13 | Disposition: A | Discharge status: Home / Self Care | Payer: Self-pay | Source: Ambulatory Visit | Attending: Cardiology | Admitting: Cardiology

## 2011-05-16 ENCOUNTER — Encounter (HOSPITAL_COMMUNITY)
Admission: RE | Admit: 2011-05-16 | Discharge: 2011-05-16 | Disposition: A | Discharge status: Home / Self Care | Payer: Self-pay | Source: Ambulatory Visit | Attending: Cardiology | Admitting: Cardiology

## 2011-05-18 ENCOUNTER — Encounter (HOSPITAL_COMMUNITY)
Admission: RE | Admit: 2011-05-18 | Discharge: 2011-05-18 | Disposition: A | Discharge status: Home / Self Care | Payer: Self-pay | Source: Ambulatory Visit | Attending: Cardiology | Admitting: Cardiology

## 2011-05-20 ENCOUNTER — Encounter (HOSPITAL_COMMUNITY)
Admission: RE | Admit: 2011-05-20 | Discharge: 2011-05-20 | Disposition: A | Discharge status: Home / Self Care | Payer: Self-pay | Source: Ambulatory Visit | Attending: Cardiology | Admitting: Cardiology

## 2011-05-23 ENCOUNTER — Encounter (HOSPITAL_COMMUNITY)
Admission: RE | Admit: 2011-05-23 | Discharge: 2011-05-23 | Disposition: A | Discharge status: Home / Self Care | Payer: Self-pay | Source: Ambulatory Visit | Attending: Cardiology | Admitting: Cardiology

## 2011-05-25 ENCOUNTER — Encounter (HOSPITAL_COMMUNITY)
Admission: RE | Admit: 2011-05-25 | Discharge: 2011-05-25 | Disposition: A | Discharge status: Home / Self Care | Payer: Self-pay | Source: Ambulatory Visit | Attending: Cardiology | Admitting: Cardiology

## 2011-05-27 ENCOUNTER — Encounter (HOSPITAL_COMMUNITY)
Admission: RE | Admit: 2011-05-27 | Discharge: 2011-05-27 | Disposition: A | Discharge status: Home / Self Care | Payer: Self-pay | Source: Ambulatory Visit | Attending: Cardiology | Admitting: Cardiology

## 2011-05-30 ENCOUNTER — Encounter (HOSPITAL_COMMUNITY)
Admission: RE | Admit: 2011-05-30 | Discharge: 2011-05-30 | Disposition: A | Discharge status: Home / Self Care | Payer: Self-pay | Source: Ambulatory Visit | Attending: Cardiology | Admitting: Cardiology

## 2011-06-01 ENCOUNTER — Encounter (HOSPITAL_COMMUNITY)
Admission: RE | Admit: 2011-06-01 | Discharge: 2011-06-01 | Disposition: A | Discharge status: Home / Self Care | Payer: Self-pay | Source: Ambulatory Visit | Attending: Cardiology | Admitting: Cardiology

## 2011-06-01 DIAGNOSIS — E785 Hyperlipidemia, unspecified: Secondary | ICD-10-CM | POA: Insufficient documentation

## 2011-06-01 DIAGNOSIS — I10 Essential (primary) hypertension: Secondary | ICD-10-CM | POA: Insufficient documentation

## 2011-06-01 DIAGNOSIS — Z5189 Encounter for other specified aftercare: Secondary | ICD-10-CM | POA: Insufficient documentation

## 2011-06-01 DIAGNOSIS — E119 Type 2 diabetes mellitus without complications: Secondary | ICD-10-CM | POA: Insufficient documentation

## 2011-06-01 DIAGNOSIS — I2510 Atherosclerotic heart disease of native coronary artery without angina pectoris: Secondary | ICD-10-CM | POA: Insufficient documentation

## 2011-06-03 ENCOUNTER — Encounter (HOSPITAL_COMMUNITY)
Admission: RE | Admit: 2011-06-03 | Discharge: 2011-06-03 | Disposition: A | Discharge status: Home / Self Care | Payer: Self-pay | Source: Ambulatory Visit | Attending: Cardiology | Admitting: Cardiology

## 2011-06-06 ENCOUNTER — Encounter (HOSPITAL_COMMUNITY)
Admission: RE | Admit: 2011-06-06 | Discharge: 2011-06-06 | Disposition: A | Discharge status: Home / Self Care | Payer: Self-pay | Source: Ambulatory Visit | Attending: Cardiology | Admitting: Cardiology

## 2011-06-08 ENCOUNTER — Encounter (HOSPITAL_COMMUNITY)
Admission: RE | Admit: 2011-06-08 | Discharge: 2011-06-08 | Disposition: A | Discharge status: Home / Self Care | Payer: Self-pay | Source: Ambulatory Visit | Attending: Cardiology | Admitting: Cardiology

## 2011-06-10 ENCOUNTER — Encounter (HOSPITAL_COMMUNITY)
Admission: RE | Admit: 2011-06-10 | Discharge: 2011-06-10 | Disposition: A | Discharge status: Home / Self Care | Payer: Self-pay | Source: Ambulatory Visit | Attending: Cardiology | Admitting: Cardiology

## 2011-06-13 ENCOUNTER — Encounter (HOSPITAL_COMMUNITY)
Admission: RE | Admit: 2011-06-13 | Discharge: 2011-06-13 | Disposition: A | Discharge status: Home / Self Care | Payer: Self-pay | Source: Ambulatory Visit | Attending: Cardiology | Admitting: Cardiology

## 2011-06-15 ENCOUNTER — Encounter (HOSPITAL_COMMUNITY)
Admission: RE | Admit: 2011-06-15 | Discharge: 2011-06-15 | Disposition: A | Discharge status: Home / Self Care | Payer: Self-pay | Source: Ambulatory Visit | Attending: Cardiology | Admitting: Cardiology

## 2011-06-17 ENCOUNTER — Encounter (HOSPITAL_COMMUNITY): Payer: Self-pay

## 2011-06-20 ENCOUNTER — Encounter (HOSPITAL_COMMUNITY)
Admission: RE | Admit: 2011-06-20 | Discharge: 2011-06-20 | Disposition: A | Discharge status: Home / Self Care | Payer: Self-pay | Source: Ambulatory Visit | Attending: Cardiology | Admitting: Cardiology

## 2011-06-22 ENCOUNTER — Encounter (HOSPITAL_COMMUNITY)
Admission: RE | Admit: 2011-06-22 | Discharge: 2011-06-22 | Disposition: A | Discharge status: Home / Self Care | Payer: Self-pay | Source: Ambulatory Visit | Attending: Cardiology | Admitting: Cardiology

## 2011-06-23 DIAGNOSIS — Z961 Presence of intraocular lens: Secondary | ICD-10-CM | POA: Insufficient documentation

## 2011-06-24 ENCOUNTER — Encounter (HOSPITAL_COMMUNITY)
Admission: RE | Admit: 2011-06-24 | Discharge: 2011-06-24 | Disposition: A | Discharge status: Home / Self Care | Payer: Self-pay | Source: Ambulatory Visit | Attending: Cardiology | Admitting: Cardiology

## 2011-06-27 ENCOUNTER — Encounter (HOSPITAL_COMMUNITY): Payer: Self-pay

## 2011-06-29 ENCOUNTER — Encounter (HOSPITAL_COMMUNITY): Payer: Self-pay

## 2011-06-30 ENCOUNTER — Emergency Department (HOSPITAL_COMMUNITY): Payer: Medicare Other

## 2011-06-30 ENCOUNTER — Encounter (HOSPITAL_COMMUNITY): Payer: Self-pay | Admitting: Emergency Medicine

## 2011-06-30 ENCOUNTER — Emergency Department (HOSPITAL_COMMUNITY)
Admission: EM | Admit: 2011-06-30 | Discharge: 2011-06-30 | Disposition: A | Discharge status: Home / Self Care | Payer: Medicare Other | Attending: Emergency Medicine | Admitting: Emergency Medicine

## 2011-06-30 DIAGNOSIS — M549 Dorsalgia, unspecified: Secondary | ICD-10-CM | POA: Insufficient documentation

## 2011-06-30 DIAGNOSIS — E119 Type 2 diabetes mellitus without complications: Secondary | ICD-10-CM | POA: Insufficient documentation

## 2011-06-30 DIAGNOSIS — N200 Calculus of kidney: Secondary | ICD-10-CM | POA: Insufficient documentation

## 2011-06-30 DIAGNOSIS — I2510 Atherosclerotic heart disease of native coronary artery without angina pectoris: Secondary | ICD-10-CM | POA: Insufficient documentation

## 2011-06-30 DIAGNOSIS — I428 Other cardiomyopathies: Secondary | ICD-10-CM | POA: Insufficient documentation

## 2011-06-30 DIAGNOSIS — Z79899 Other long term (current) drug therapy: Secondary | ICD-10-CM | POA: Insufficient documentation

## 2011-06-30 DIAGNOSIS — I10 Essential (primary) hypertension: Secondary | ICD-10-CM | POA: Insufficient documentation

## 2011-06-30 LAB — URINALYSIS, ROUTINE W REFLEX MICROSCOPIC
Glucose, UA: 250 mg/dL — AB
Protein, ur: NEGATIVE mg/dL
pH: 7 (ref 5.0–8.0)

## 2011-06-30 LAB — URINE MICROSCOPIC-ADD ON

## 2011-06-30 LAB — BASIC METABOLIC PANEL
CO2: 22 mEq/L (ref 19–32)
Chloride: 103 mEq/L (ref 96–112)
Sodium: 137 mEq/L (ref 135–145)

## 2011-06-30 LAB — DIFFERENTIAL
Basophils Absolute: 0 10*3/uL (ref 0.0–0.1)
Lymphocytes Relative: 22 % (ref 12–46)
Neutro Abs: 3.3 10*3/uL (ref 1.7–7.7)

## 2011-06-30 LAB — CBC
HCT: 33.5 % — ABNORMAL LOW (ref 36.0–46.0)
Platelets: 242 10*3/uL (ref 150–400)
RDW: 13.4 % (ref 11.5–15.5)
WBC: 4.6 10*3/uL (ref 4.0–10.5)

## 2011-06-30 MED ORDER — HYDROCODONE-ACETAMINOPHEN 5-325 MG PO TABS: 1.0000 | ORAL_TABLET | ORAL | Status: AC | PRN

## 2011-06-30 MED ORDER — FENTANYL CITRATE 0.05 MG/ML IJ SOLN
50.0000 ug | Freq: Once | INTRAMUSCULAR | Status: AC
  Administered 2011-06-30: 50 ug via INTRAVENOUS
  Filled 2011-06-30: qty 2

## 2011-06-30 MED ORDER — HYDROCODONE-ACETAMINOPHEN 5-325 MG PO TABS
1.0000 | ORAL_TABLET | Freq: Once | ORAL | Status: AC
  Administered 2011-06-30: 1 via ORAL
  Filled 2011-06-30: qty 1

## 2011-06-30 NOTE — ED Notes (Signed)
Pt's son called. Dr. Erick Alley. Cell: [phone]

## 2011-06-30 NOTE — Discharge Instructions (Signed)
Back Pain, Adult Low back pain is very common. About 1 in 5 people have back pain.The cause of low back pain is rarely dangerous. The pain often gets better over time.About half of people with a sudden onset of back pain feel better in just 2 weeks. About 8 in 10 people feel better by 6 weeks.  CAUSES Some common causes of back pain include:  Strain of the muscles or ligaments supporting the spine.   Wear and tear (degeneration) of the spinal discs.   Arthritis.   Direct injury to the back.  DIAGNOSIS Most of the time, the direct cause of low back pain is not known.However, back pain can be treated effectively even when the exact cause of the pain is unknown.Answering your caregiver's questions about your overall health and symptoms is one of the most accurate ways to make sure the cause of your pain is not dangerous. If your caregiver needs more information, he or she may order lab work or imaging tests (X-rays or MRIs).However, even if imaging tests show changes in your back, this usually does not require surgery. HOME CARE INSTRUCTIONS For many people, back pain returns.Since low back pain is rarely dangerous, it is often a condition that people can learn to manageon their own.   Remain active. It is stressful on the back to sit or stand in one place. Do not sit, drive, or stand in one place for more than 30 minutes at a time. Take short walks on level surfaces as soon as pain allows.Try to increase the length of time you walk each day.   Do not stay in bed.Resting more than 1 or 2 days can delay your recovery.   Do not avoid exercise or work.Your body is made to move.It is not dangerous to be active, even though your back may hurt.Your back will likely heal faster if you return to being active before your pain is gone.   Pay attention to your body when you bend and lift. Many people have less discomfortwhen lifting if they bend their knees, keep the load close to their  bodies,and avoid twisting. Often, the most comfortable positions are those that put less stress on your recovering back.   Find a comfortable position to sleep. Use a firm mattress and lie on your side with your knees slightly bent. If you lie on your back, put a pillow under your knees.   Only take over-the-counter or prescription medicines as directed by your caregiver. Over-the-counter medicines to reduce pain and inflammation are often the most helpful.Your caregiver may prescribe muscle relaxant drugs.These medicines help dull your pain so you can more quickly return to your normal activities and healthy exercise.   Put ice on the injured area.   Put ice in a plastic bag.   Place a towel between your skin and the bag.   Leave the ice on for 15 to 20 minutes, 3 to 4 times a day for the first 2 to 3 days. After that, ice and heat may be alternated to reduce pain and spasms.   Ask your caregiver about trying back exercises and gentle massage. This may be of some benefit.   Avoid feeling anxious or stressed.Stress increases muscle tension and can worsen back pain.It is important to recognize when you are anxious or stressed and learn ways to manage it.Exercise is a great option.  SEEK MEDICAL CARE IF:  You have pain that is not relieved with rest or medicine.   You have   pain that does not improve in 1 week.   You have new symptoms.   You are generally not feeling well.  SEEK IMMEDIATE MEDICAL CARE IF:   You have pain that radiates from your back into your legs.   You develop new bowel or bladder control problems.   You have unusual weakness or numbness in your arms or legs.   You develop nausea or vomiting.   You develop abdominal pain.   You feel faint.  Document Released: 01/17/2005 Document Revised: 01/06/2011 Document Reviewed: 06/07/2010 [employer] Patient Information 2012 [employer]. 

## 2011-06-30 NOTE — ED Notes (Addendum)
Pt from home. Per EMS pt experiencing extreme R flank pain (10/10), guarding behaviors; kidney stone hx more than 50 years ago. EMS administered 179mcg fentanyl.

## 2011-06-30 NOTE — ED Provider Notes (Addendum)
History     CSN: [id number]  Arrival date & time 06/30/11  [id number]   First MD Initiated Contact with Patient 06/30/11 1007      Chief Complaint  Patient presents with  . Flank Pain    (Consider location/radiation/quality/duration/timing/severity/associated sxs/prior treatment) HPI Comments: Patient states that she was in the bathroom urinating and had onset of right flank and right lower back pain.  She does have a history of kidney stones 50 years ago and states this feels similar but not as intense.  She had no hematuria or dysuria.  No fevers.  No nausea or vomiting.  No changes in bowel habits.  No abdominal pain.  No specific injuries or falls.  No cough, chest pain or shortness of breath.  Patient did receive fentanyl from EMS and states that significantly relieved her pain.  Certain movements can worsen the pain.  Patient is a 78 y.o. female presenting with flank pain. The history is provided by the patient. No language interpreter was used.  Flank Pain This is a new problem. Pertinent negatives include no chest pain, no abdominal pain, no headaches and no shortness of breath.    Past Medical History  Diagnosis Date  . Shortness of breath   . HTN (hypertension)   . DM (diabetes mellitus)   . Pleural effusion   . CAD (coronary artery disease)     status post stenting of the RCA status post CABG. (left internal mammary artery to  LAD, saphenous vein graft to second diagonal, saphenous vein  graft to obtuse marginal 1, saphenous vein graft to posterior  descending).  . Cardiomyopathy      Mildly reduced EF (45%).     Past Surgical History  Procedure Date  . Bilateral cataract surgery   . Coronary artery bypass graft     History reviewed. No pertinent family history.  History  Substance Use Topics  . Smoking status: Never Smoker   . Smokeless tobacco: Not on file  . Alcohol Use: Not on file    OB History    Grav Para Term Preterm Abortions TAB SAB Ect Mult Living                 Review of Systems  Constitutional: Negative.  Negative for fever and chills.  HENT: Negative.   Eyes: Negative.  Negative for discharge and redness.  Respiratory: Negative.  Negative for cough and shortness of breath.   Cardiovascular: Negative.  Negative for chest pain.  Gastrointestinal: Negative.  Negative for nausea, vomiting, abdominal pain and diarrhea.  Genitourinary: Positive for flank pain. Negative for dysuria and vaginal discharge.  Musculoskeletal: Negative.  Negative for back pain.  Skin: Negative.  Negative for color change and rash.  Neurological: Negative.  Negative for syncope and headaches.  Hematological: Negative.  Negative for adenopathy.  Psychiatric/Behavioral: Negative.  Negative for confusion.  All other systems reviewed and are negative.    Allergies  Review of patient's allergies indicates no known allergies.  Home Medications   Current Outpatient Rx  Name Route Sig Dispense Refill  . AMLODIPINE BESYLATE 10 MG PO TABS Oral Take 10 mg by mouth daily.    . ASPIRIN 81 MG PO TABS Oral Take 162 mg by mouth daily.     Marland Kitchen GLIPIZIDE ER 5 MG PO TB24 Oral Take 5 mg by mouth daily.    . ISOSORBIDE MONONITRATE ER 60 MG PO TB24 Oral Take 60 mg by mouth daily.    Marland Kitchen LOSARTAN POTASSIUM 100  MG PO TABS Oral Take 100 mg by mouth daily.    Marland Kitchen METFORMIN HCL 1000 MG PO TABS Oral Take 1,000 mg by mouth 2 (two) times daily with a meal.     . METOPROLOL TARTRATE 50 MG PO TABS Oral Take 50 mg by mouth 2 (two) times daily.      BP 200/100  Pulse 80  Physical Exam  Nursing note and vitals reviewed. Constitutional: She is oriented to person, place, and time. She appears well-developed and well-nourished.  Non-toxic appearance. She does not have a sickly appearance.  HENT:  Head: Normocephalic and atraumatic.  Eyes: Conjunctivae, EOM and lids are normal. Pupils are equal, round, and reactive to light. No scleral icterus.  Neck: Trachea normal and normal range of  motion. Neck supple.  Cardiovascular: Normal rate, regular rhythm and normal heart sounds.  Exam reveals no gallop and no friction rub.   No murmur heard. Pulmonary/Chest: Effort normal and breath sounds normal. No respiratory distress. She has no wheezes. She has no rales.  Abdominal: Soft. Normal appearance. There is no tenderness. There is no rebound, no guarding and no CVA tenderness.  Musculoskeletal: Normal range of motion.       Tenderness to palpation over right flank and right lower back.  No focal T-spine or L-spine tenderness on examination.  Neurological: She is alert and oriented to person, place, and time. She has normal strength.  Skin: Skin is warm, dry and intact. No rash noted.  Psychiatric: She has a normal mood and affect. Her behavior is normal. Judgment and thought content normal.    ED Course  Procedures (including critical care time)  Labs Reviewed  CBC - Abnormal; Notable for the following:    RBC 3.73 (*)    Hemoglobin 11.3 (*)    HCT 33.5 (*)    All other components within normal limits  BASIC METABOLIC PANEL - Abnormal; Notable for the following:    Glucose, Bld 239 (*)    Creatinine, Ser 1.22 (*)    GFR calc non Af Amer 40 (*)    GFR calc Af Amer 47 (*)    All other components within normal limits  DIFFERENTIAL  URINALYSIS, ROUTINE W REFLEX MICROSCOPIC   Ct Abdomen Pelvis Wo Contrast  06/30/2011  *RADIOLOGY REPORT*  Clinical Data: Right-sided flank pain.  CT ABDOMEN AND PELVIS WITHOUT CONTRAST  Technique:  Multidetector CT imaging of the abdomen and pelvis was performed following the standard protocol without intravenous contrast.  Comparison: None.  Findings: Coronary artery calcifications are noted.  Prior CABG. Heart is normal size.  Lung bases are clear.  No effusions.  Small hiatal hernia.  Bilateral nephrolithiasis, the largest stone in the left lower pole measuring 7 mm.  No ureteral stones or hydronephrosis.  Liver, gallbladder, spleen, pancreas,  adrenals are unremarkable. Small umbilical hernia containing fat.  Large and small bowel grossly unremarkable.  No free fluid, free air or adenopathy.  Aorta and iliac vessels are heavily calcified.  Uterus, adnexa grossly unremarkable appearance.  No acute bony abnormality.  Advanced degenerative changes throughout the lumbar spine.  IMPRESSION: Bilateral nephrolithiasis.  No ureteral stones or hydronephrosis.  No acute findings.  Chronic findings as above.  Original Report Authenticated By: Raelyn Number, M.D.     No diagnosis found.    MDM  Patient with right flank pain that was somewhat sudden in onset.  Patient is tender to palpation which fits more with musculoskeletal pain.  There's no signs of rash  to suggest shingles at this time.  CT abdomen pelvis to look for kidney stone shows nephrolithiasis without any signs of active ureteral stones to cause pain.  We are still awaiting the patient's urinalysis to check for UTI.  She does not have any abdominal pain or tenderness to suggest that there is an intra-abdominal cause for her symptoms.  No AAA is seen on her CT scan.  Given the patient's localized tenderness it seems less consistent with aortic dissection.        Lezlie Octave, MD 06/30/11 1213  Patient without acute abnormality on her CT scan to demonstrate acute stone that is the cause of her symptoms.  No urinary tract infection.  Patient has otherwise normal laboratory studies.  Her blood pressure has normalized tear without further intervention.  Patient can ambulate but is slightly off balance and does need some amount of assistance by hanging onto walls or a person.  I've contacted social work to assess patient's potential options for assistance and they note that she might be eligible for home health options but is not  eligible for nursing facilities at this time.  I will contact case management for further assistance in options for this patient.  Lezlie Octave,  MD 06/30/11 1557  Pt to be seen by case management who recommends home health care and rolling walker which I have ordered.    Lezlie Octave, MD 06/30/11 [phone]  Patient seen by Manuela Schwartz from case management.  She is offered to assist the patient in being set up with home health care and rolling walker the patient is refusing this at this time.  Notably patient did not make any mention of having difficulty walking until she became concerned that she was going to be discharged home.  We have expressed our desire to help the patient with any balance issues by assisting her with home health care and a walker which the patient has declined.  Patient does note that her daughter is driving up from [place] and should be and at approximately 11 PM tonight.  We have offered to keep the patient in the emergency department until that time so the daughter to personally pick her up and take her home.  The patient is declining this at this time as well.  Patient has found another ride home since she does not wish to stay in the emergency department until the daughter arrives.  Lezlie Octave, MD 06/30/11 [phone]

## 2011-06-30 NOTE — ED Notes (Signed)
Pt transported to CT ?

## 2011-07-01 ENCOUNTER — Encounter (HOSPITAL_COMMUNITY): Payer: Medicare Other

## 2011-07-04 ENCOUNTER — Encounter (HOSPITAL_COMMUNITY): Payer: Self-pay

## 2011-07-04 DIAGNOSIS — E119 Type 2 diabetes mellitus without complications: Secondary | ICD-10-CM | POA: Insufficient documentation

## 2011-07-04 DIAGNOSIS — E785 Hyperlipidemia, unspecified: Secondary | ICD-10-CM | POA: Insufficient documentation

## 2011-07-04 DIAGNOSIS — Z5189 Encounter for other specified aftercare: Secondary | ICD-10-CM | POA: Insufficient documentation

## 2011-07-04 DIAGNOSIS — I10 Essential (primary) hypertension: Secondary | ICD-10-CM | POA: Insufficient documentation

## 2011-07-04 DIAGNOSIS — I2510 Atherosclerotic heart disease of native coronary artery without angina pectoris: Secondary | ICD-10-CM | POA: Insufficient documentation

## 2011-07-06 ENCOUNTER — Encounter (HOSPITAL_COMMUNITY): Payer: Self-pay

## 2011-07-08 ENCOUNTER — Encounter (HOSPITAL_COMMUNITY): Payer: Self-pay

## 2011-07-11 ENCOUNTER — Encounter (HOSPITAL_COMMUNITY): Payer: Self-pay

## 2011-07-13 ENCOUNTER — Encounter (HOSPITAL_COMMUNITY): Payer: Self-pay

## 2011-07-15 ENCOUNTER — Encounter (HOSPITAL_COMMUNITY): Payer: Self-pay

## 2011-07-18 ENCOUNTER — Encounter (HOSPITAL_COMMUNITY): Payer: Self-pay

## 2011-07-20 ENCOUNTER — Encounter (HOSPITAL_COMMUNITY): Payer: Self-pay

## 2011-07-22 ENCOUNTER — Encounter (HOSPITAL_COMMUNITY): Payer: Self-pay

## 2011-07-25 ENCOUNTER — Encounter (HOSPITAL_COMMUNITY)
Admission: RE | Admit: 2011-07-25 | Discharge: 2011-07-25 | Disposition: A | Discharge status: Home / Self Care | Payer: Self-pay | Source: Ambulatory Visit | Attending: Cardiology | Admitting: Cardiology

## 2011-07-27 ENCOUNTER — Encounter (HOSPITAL_COMMUNITY): Payer: Self-pay

## 2011-07-29 ENCOUNTER — Encounter (HOSPITAL_COMMUNITY)
Admission: RE | Admit: 2011-07-29 | Discharge: 2011-07-29 | Disposition: A | Discharge status: Home / Self Care | Payer: Self-pay | Source: Ambulatory Visit | Attending: Cardiology | Admitting: Cardiology

## 2011-08-01 ENCOUNTER — Encounter (HOSPITAL_COMMUNITY): Payer: Self-pay

## 2011-08-01 DIAGNOSIS — Z5189 Encounter for other specified aftercare: Secondary | ICD-10-CM | POA: Insufficient documentation

## 2011-08-01 DIAGNOSIS — E119 Type 2 diabetes mellitus without complications: Secondary | ICD-10-CM | POA: Insufficient documentation

## 2011-08-01 DIAGNOSIS — I2510 Atherosclerotic heart disease of native coronary artery without angina pectoris: Secondary | ICD-10-CM | POA: Insufficient documentation

## 2011-08-01 DIAGNOSIS — I10 Essential (primary) hypertension: Secondary | ICD-10-CM | POA: Insufficient documentation

## 2011-08-01 DIAGNOSIS — E785 Hyperlipidemia, unspecified: Secondary | ICD-10-CM | POA: Insufficient documentation

## 2011-08-02 ENCOUNTER — Other Ambulatory Visit: Payer: Self-pay | Admitting: Cardiology

## 2011-08-02 NOTE — Telephone Encounter (Signed)
..   Requested Prescriptions   Pending Prescriptions Disp Refills  . isosorbide mononitrate (IMDUR) 60 MG 24 hr tablet [Pharmacy Med Name: ISOSORB MONO ER 60MG TAB] 30 tablet 12    Sig: TAKE ONE TABLET BY MOUTH DAILY.

## 2011-08-03 ENCOUNTER — Encounter (HOSPITAL_COMMUNITY)
Admission: RE | Admit: 2011-08-03 | Discharge: 2011-08-03 | Disposition: A | Discharge status: Home / Self Care | Payer: Self-pay | Source: Ambulatory Visit | Attending: Cardiology | Admitting: Cardiology

## 2011-08-05 ENCOUNTER — Encounter (HOSPITAL_COMMUNITY): Payer: Self-pay

## 2011-08-08 ENCOUNTER — Encounter (HOSPITAL_COMMUNITY)
Admission: RE | Admit: 2011-08-08 | Discharge: 2011-08-08 | Disposition: A | Discharge status: Home / Self Care | Payer: Self-pay | Source: Ambulatory Visit | Attending: Cardiology | Admitting: Cardiology

## 2011-08-10 ENCOUNTER — Encounter (HOSPITAL_COMMUNITY)
Admission: RE | Admit: 2011-08-10 | Discharge: 2011-08-10 | Disposition: A | Discharge status: Home / Self Care | Payer: Self-pay | Source: Ambulatory Visit | Attending: Cardiology | Admitting: Cardiology

## 2011-08-12 ENCOUNTER — Encounter (HOSPITAL_COMMUNITY): Payer: Self-pay

## 2011-08-15 ENCOUNTER — Encounter (HOSPITAL_COMMUNITY)
Admission: RE | Admit: 2011-08-15 | Discharge: 2011-08-15 | Disposition: A | Discharge status: Home / Self Care | Payer: Self-pay | Source: Ambulatory Visit | Attending: Cardiology | Admitting: Cardiology

## 2011-08-17 ENCOUNTER — Encounter (HOSPITAL_COMMUNITY)
Admission: RE | Admit: 2011-08-17 | Discharge: 2011-08-17 | Disposition: A | Discharge status: Home / Self Care | Payer: Self-pay | Source: Ambulatory Visit | Attending: Cardiology | Admitting: Cardiology

## 2011-08-19 ENCOUNTER — Encounter (HOSPITAL_COMMUNITY): Payer: Self-pay

## 2011-08-22 ENCOUNTER — Encounter (HOSPITAL_COMMUNITY)
Admission: RE | Admit: 2011-08-22 | Discharge: 2011-08-22 | Disposition: A | Discharge status: Home / Self Care | Payer: Self-pay | Source: Ambulatory Visit | Attending: Cardiology | Admitting: Cardiology

## 2011-08-24 ENCOUNTER — Encounter (HOSPITAL_COMMUNITY)
Admission: RE | Admit: 2011-08-24 | Discharge: 2011-08-24 | Disposition: A | Discharge status: Home / Self Care | Payer: Self-pay | Source: Ambulatory Visit | Attending: Cardiology | Admitting: Cardiology

## 2011-08-26 ENCOUNTER — Encounter (HOSPITAL_COMMUNITY): Payer: Self-pay

## 2011-08-29 ENCOUNTER — Encounter (HOSPITAL_COMMUNITY)
Admission: RE | Admit: 2011-08-29 | Discharge: 2011-08-29 | Disposition: A | Discharge status: Home / Self Care | Payer: Self-pay | Source: Ambulatory Visit | Attending: Cardiology | Admitting: Cardiology

## 2011-08-31 ENCOUNTER — Encounter (HOSPITAL_COMMUNITY)
Admission: RE | Admit: 2011-08-31 | Discharge: 2011-08-31 | Disposition: A | Discharge status: Home / Self Care | Payer: Self-pay | Source: Ambulatory Visit | Attending: Cardiology | Admitting: Cardiology

## 2011-09-02 ENCOUNTER — Encounter (HOSPITAL_COMMUNITY): Payer: Self-pay

## 2011-09-02 DIAGNOSIS — Z5189 Encounter for other specified aftercare: Secondary | ICD-10-CM | POA: Insufficient documentation

## 2011-09-02 DIAGNOSIS — E119 Type 2 diabetes mellitus without complications: Secondary | ICD-10-CM | POA: Insufficient documentation

## 2011-09-02 DIAGNOSIS — I10 Essential (primary) hypertension: Secondary | ICD-10-CM | POA: Insufficient documentation

## 2011-09-02 DIAGNOSIS — I2510 Atherosclerotic heart disease of native coronary artery without angina pectoris: Secondary | ICD-10-CM | POA: Insufficient documentation

## 2011-09-02 DIAGNOSIS — E785 Hyperlipidemia, unspecified: Secondary | ICD-10-CM | POA: Insufficient documentation

## 2011-09-05 ENCOUNTER — Encounter (HOSPITAL_COMMUNITY)
Admission: RE | Admit: 2011-09-05 | Discharge: 2011-09-05 | Disposition: A | Discharge status: Home / Self Care | Payer: Self-pay | Source: Ambulatory Visit | Attending: Cardiology | Admitting: Cardiology

## 2011-09-07 ENCOUNTER — Encounter (HOSPITAL_COMMUNITY)
Admission: RE | Admit: 2011-09-07 | Discharge: 2011-09-07 | Disposition: A | Discharge status: Home / Self Care | Payer: Self-pay | Source: Ambulatory Visit | Attending: Cardiology | Admitting: Cardiology

## 2011-09-09 ENCOUNTER — Encounter (HOSPITAL_COMMUNITY)
Admission: RE | Admit: 2011-09-09 | Discharge: 2011-09-09 | Disposition: A | Discharge status: Home / Self Care | Payer: Self-pay | Source: Ambulatory Visit | Attending: Cardiology | Admitting: Cardiology

## 2011-09-10 ENCOUNTER — Other Ambulatory Visit: Payer: Self-pay | Admitting: Cardiology

## 2011-09-11 ENCOUNTER — Other Ambulatory Visit: Payer: Self-pay | Admitting: Cardiology

## 2011-09-12 ENCOUNTER — Encounter (HOSPITAL_COMMUNITY)
Admission: RE | Admit: 2011-09-12 | Discharge: 2011-09-12 | Disposition: A | Discharge status: Home / Self Care | Payer: Self-pay | Source: Ambulatory Visit | Attending: Cardiology | Admitting: Cardiology

## 2011-09-12 NOTE — Telephone Encounter (Signed)
..   Requested Prescriptions   Pending Prescriptions Disp Refills  . losartan (COZAAR) 100 MG tablet [Pharmacy Med Name: LOSARTAN 100MG    TAB] 60 tablet 3    Sig: TAKE ONE TABLET BY MOUTH EVERY DAY  Please call the office to schedule a 6 mths visit

## 2011-09-12 NOTE — Telephone Encounter (Signed)
..   Requested Prescriptions   Pending Prescriptions Disp Refills  . amLODipine (NORVASC) 10 MG tablet [Pharmacy Med Name: AMLODIPINE 10MG      TAB] 30 tablet 3    Sig: TAKE ONE TABLET BY MOUTH DAILY.  Marland Kitchen.Patient needs to contact office to schedule  Appointment  for future refills.[phone]. Thank you.

## 2011-09-14 ENCOUNTER — Encounter (HOSPITAL_COMMUNITY)
Admission: RE | Admit: 2011-09-14 | Discharge: 2011-09-14 | Disposition: A | Discharge status: Home / Self Care | Payer: Self-pay | Source: Ambulatory Visit | Attending: Cardiology | Admitting: Cardiology

## 2011-09-16 ENCOUNTER — Encounter (HOSPITAL_COMMUNITY): Payer: Self-pay

## 2011-09-19 ENCOUNTER — Encounter (HOSPITAL_COMMUNITY)
Admission: RE | Admit: 2011-09-19 | Discharge: 2011-09-19 | Disposition: A | Discharge status: Home / Self Care | Payer: Self-pay | Source: Ambulatory Visit | Attending: Cardiology | Admitting: Cardiology

## 2011-09-21 ENCOUNTER — Encounter (HOSPITAL_COMMUNITY)
Admission: RE | Admit: 2011-09-21 | Discharge: 2011-09-21 | Disposition: A | Discharge status: Home / Self Care | Payer: Self-pay | Source: Ambulatory Visit | Attending: Cardiology | Admitting: Cardiology

## 2011-09-23 ENCOUNTER — Encounter (HOSPITAL_COMMUNITY): Payer: Self-pay

## 2011-09-26 ENCOUNTER — Encounter (HOSPITAL_COMMUNITY): Payer: Self-pay

## 2011-09-28 ENCOUNTER — Encounter (HOSPITAL_COMMUNITY)
Admission: RE | Admit: 2011-09-28 | Discharge: 2011-09-28 | Disposition: A | Discharge status: Home / Self Care | Payer: Self-pay | Source: Ambulatory Visit | Attending: Cardiology | Admitting: Cardiology

## 2011-09-30 ENCOUNTER — Encounter (HOSPITAL_COMMUNITY)
Admission: RE | Admit: 2011-09-30 | Discharge: 2011-09-30 | Disposition: A | Discharge status: Home / Self Care | Payer: Self-pay | Source: Ambulatory Visit | Attending: Cardiology | Admitting: Cardiology

## 2011-10-03 ENCOUNTER — Encounter (HOSPITAL_COMMUNITY): Payer: Self-pay

## 2011-10-05 ENCOUNTER — Encounter (HOSPITAL_COMMUNITY): Payer: Self-pay

## 2011-10-05 DIAGNOSIS — E119 Type 2 diabetes mellitus without complications: Secondary | ICD-10-CM | POA: Insufficient documentation

## 2011-10-05 DIAGNOSIS — E785 Hyperlipidemia, unspecified: Secondary | ICD-10-CM | POA: Insufficient documentation

## 2011-10-05 DIAGNOSIS — Z5189 Encounter for other specified aftercare: Secondary | ICD-10-CM | POA: Insufficient documentation

## 2011-10-05 DIAGNOSIS — I10 Essential (primary) hypertension: Secondary | ICD-10-CM | POA: Insufficient documentation

## 2011-10-05 DIAGNOSIS — I2510 Atherosclerotic heart disease of native coronary artery without angina pectoris: Secondary | ICD-10-CM | POA: Insufficient documentation

## 2011-10-07 ENCOUNTER — Encounter (HOSPITAL_COMMUNITY): Payer: Self-pay

## 2011-10-10 ENCOUNTER — Encounter (HOSPITAL_COMMUNITY)
Admission: RE | Admit: 2011-10-10 | Discharge: 2011-10-10 | Disposition: A | Discharge status: Home / Self Care | Payer: Self-pay | Source: Ambulatory Visit | Attending: Cardiology | Admitting: Cardiology

## 2011-10-12 ENCOUNTER — Encounter (HOSPITAL_COMMUNITY)
Admission: RE | Admit: 2011-10-12 | Discharge: 2011-10-12 | Disposition: A | Discharge status: Home / Self Care | Payer: Self-pay | Source: Ambulatory Visit | Attending: Cardiology | Admitting: Cardiology

## 2011-10-14 ENCOUNTER — Encounter (HOSPITAL_COMMUNITY)
Admission: RE | Admit: 2011-10-14 | Discharge: 2011-10-14 | Disposition: A | Discharge status: Home / Self Care | Payer: Self-pay | Source: Ambulatory Visit | Attending: Cardiology | Admitting: Cardiology

## 2011-10-17 ENCOUNTER — Encounter (HOSPITAL_COMMUNITY)
Admission: RE | Admit: 2011-10-17 | Discharge: 2011-10-17 | Disposition: A | Discharge status: Home / Self Care | Payer: Self-pay | Source: Ambulatory Visit | Attending: Cardiology | Admitting: Cardiology

## 2011-10-19 ENCOUNTER — Encounter (HOSPITAL_COMMUNITY)
Admission: RE | Admit: 2011-10-19 | Discharge: 2011-10-19 | Disposition: A | Discharge status: Home / Self Care | Payer: Self-pay | Source: Ambulatory Visit | Attending: Cardiology | Admitting: Cardiology

## 2011-10-21 ENCOUNTER — Encounter (HOSPITAL_COMMUNITY)
Admission: RE | Admit: 2011-10-21 | Discharge: 2011-10-21 | Disposition: A | Discharge status: Home / Self Care | Payer: Self-pay | Source: Ambulatory Visit | Attending: Cardiology | Admitting: Cardiology

## 2011-10-24 ENCOUNTER — Encounter (HOSPITAL_COMMUNITY)
Admission: RE | Admit: 2011-10-24 | Discharge: 2011-10-24 | Disposition: A | Discharge status: Home / Self Care | Payer: Self-pay | Source: Ambulatory Visit | Attending: Cardiology | Admitting: Cardiology

## 2011-10-26 ENCOUNTER — Encounter (HOSPITAL_COMMUNITY)
Admission: RE | Admit: 2011-10-26 | Discharge: 2011-10-26 | Disposition: A | Discharge status: Home / Self Care | Payer: Self-pay | Source: Ambulatory Visit | Attending: Cardiology | Admitting: Cardiology

## 2011-10-28 ENCOUNTER — Encounter (HOSPITAL_COMMUNITY)
Admission: RE | Admit: 2011-10-28 | Discharge: 2011-10-28 | Disposition: A | Discharge status: Home / Self Care | Payer: Self-pay | Source: Ambulatory Visit | Attending: Cardiology | Admitting: Cardiology

## 2011-10-31 ENCOUNTER — Encounter (HOSPITAL_COMMUNITY)
Admission: RE | Admit: 2011-10-31 | Discharge: 2011-10-31 | Disposition: A | Discharge status: Home / Self Care | Payer: Self-pay | Source: Ambulatory Visit | Attending: Cardiology | Admitting: Cardiology

## 2011-11-02 ENCOUNTER — Encounter (HOSPITAL_COMMUNITY)
Admission: RE | Admit: 2011-11-02 | Discharge: 2011-11-02 | Disposition: A | Discharge status: Home / Self Care | Payer: Self-pay | Source: Ambulatory Visit | Attending: Cardiology | Admitting: Cardiology

## 2011-11-02 DIAGNOSIS — E785 Hyperlipidemia, unspecified: Secondary | ICD-10-CM | POA: Insufficient documentation

## 2011-11-02 DIAGNOSIS — E119 Type 2 diabetes mellitus without complications: Secondary | ICD-10-CM | POA: Insufficient documentation

## 2011-11-02 DIAGNOSIS — Z5189 Encounter for other specified aftercare: Secondary | ICD-10-CM | POA: Insufficient documentation

## 2011-11-02 DIAGNOSIS — I2510 Atherosclerotic heart disease of native coronary artery without angina pectoris: Secondary | ICD-10-CM | POA: Insufficient documentation

## 2011-11-02 DIAGNOSIS — I10 Essential (primary) hypertension: Secondary | ICD-10-CM | POA: Insufficient documentation

## 2011-11-04 ENCOUNTER — Encounter (HOSPITAL_COMMUNITY)
Admission: RE | Admit: 2011-11-04 | Discharge: 2011-11-04 | Disposition: A | Discharge status: Home / Self Care | Payer: Self-pay | Source: Ambulatory Visit | Attending: Cardiology | Admitting: Cardiology

## 2011-11-07 ENCOUNTER — Encounter (HOSPITAL_COMMUNITY)
Admission: RE | Admit: 2011-11-07 | Discharge: 2011-11-07 | Disposition: A | Discharge status: Home / Self Care | Payer: Self-pay | Source: Ambulatory Visit | Attending: Cardiology | Admitting: Cardiology

## 2011-11-09 ENCOUNTER — Encounter (HOSPITAL_COMMUNITY)
Admission: RE | Admit: 2011-11-09 | Discharge: 2011-11-09 | Disposition: A | Discharge status: Home / Self Care | Payer: Self-pay | Source: Ambulatory Visit | Attending: Cardiology | Admitting: Cardiology

## 2011-11-11 ENCOUNTER — Encounter (HOSPITAL_COMMUNITY)
Admission: RE | Admit: 2011-11-11 | Discharge: 2011-11-11 | Disposition: A | Discharge status: Home / Self Care | Payer: Self-pay | Source: Ambulatory Visit | Attending: Cardiology | Admitting: Cardiology

## 2011-11-14 ENCOUNTER — Encounter (HOSPITAL_COMMUNITY)
Admission: RE | Admit: 2011-11-14 | Discharge: 2011-11-14 | Disposition: A | Discharge status: Home / Self Care | Payer: Self-pay | Source: Ambulatory Visit | Attending: Cardiology | Admitting: Cardiology

## 2011-11-16 ENCOUNTER — Encounter (HOSPITAL_COMMUNITY)
Admission: RE | Admit: 2011-11-16 | Discharge: 2011-11-16 | Disposition: A | Discharge status: Home / Self Care | Payer: Self-pay | Source: Ambulatory Visit | Attending: Cardiology | Admitting: Cardiology

## 2011-11-18 ENCOUNTER — Encounter (HOSPITAL_COMMUNITY)
Admission: RE | Admit: 2011-11-18 | Discharge: 2011-11-18 | Disposition: A | Discharge status: Home / Self Care | Payer: Self-pay | Source: Ambulatory Visit | Attending: Cardiology | Admitting: Cardiology

## 2011-11-21 ENCOUNTER — Encounter (HOSPITAL_COMMUNITY)
Admission: RE | Admit: 2011-11-21 | Discharge: 2011-11-21 | Disposition: A | Discharge status: Home / Self Care | Payer: Self-pay | Source: Ambulatory Visit | Attending: Cardiology | Admitting: Cardiology

## 2011-11-23 ENCOUNTER — Encounter (HOSPITAL_COMMUNITY)
Admission: RE | Admit: 2011-11-23 | Discharge: 2011-11-23 | Disposition: A | Discharge status: Home / Self Care | Payer: Self-pay | Source: Ambulatory Visit | Attending: Cardiology | Admitting: Cardiology

## 2011-11-25 ENCOUNTER — Encounter (HOSPITAL_COMMUNITY)
Admission: RE | Admit: 2011-11-25 | Discharge: 2011-11-25 | Disposition: A | Discharge status: Home / Self Care | Payer: Self-pay | Source: Ambulatory Visit | Attending: Cardiology | Admitting: Cardiology

## 2011-11-28 ENCOUNTER — Encounter (HOSPITAL_COMMUNITY)
Admission: RE | Admit: 2011-11-28 | Discharge: 2011-11-28 | Disposition: A | Discharge status: Home / Self Care | Payer: Self-pay | Source: Ambulatory Visit | Attending: Cardiology | Admitting: Cardiology

## 2011-11-30 ENCOUNTER — Encounter (HOSPITAL_COMMUNITY)
Admission: RE | Admit: 2011-11-30 | Discharge: 2011-11-30 | Disposition: A | Discharge status: Home / Self Care | Payer: Self-pay | Source: Ambulatory Visit | Attending: Cardiology | Admitting: Cardiology

## 2011-12-02 ENCOUNTER — Encounter (HOSPITAL_COMMUNITY): Payer: Self-pay

## 2011-12-02 DIAGNOSIS — E119 Type 2 diabetes mellitus without complications: Secondary | ICD-10-CM | POA: Insufficient documentation

## 2011-12-02 DIAGNOSIS — I10 Essential (primary) hypertension: Secondary | ICD-10-CM | POA: Insufficient documentation

## 2011-12-02 DIAGNOSIS — I2510 Atherosclerotic heart disease of native coronary artery without angina pectoris: Secondary | ICD-10-CM | POA: Insufficient documentation

## 2011-12-02 DIAGNOSIS — Z5189 Encounter for other specified aftercare: Secondary | ICD-10-CM | POA: Insufficient documentation

## 2011-12-02 DIAGNOSIS — E785 Hyperlipidemia, unspecified: Secondary | ICD-10-CM | POA: Insufficient documentation

## 2011-12-05 ENCOUNTER — Encounter (HOSPITAL_COMMUNITY)
Admission: RE | Admit: 2011-12-05 | Discharge: 2011-12-05 | Disposition: A | Discharge status: Home / Self Care | Payer: Self-pay | Source: Ambulatory Visit | Attending: Cardiology | Admitting: Cardiology

## 2011-12-07 ENCOUNTER — Encounter (HOSPITAL_COMMUNITY)
Admission: RE | Admit: 2011-12-07 | Discharge: 2011-12-07 | Disposition: A | Discharge status: Home / Self Care | Payer: Self-pay | Source: Ambulatory Visit | Attending: Cardiology | Admitting: Cardiology

## 2011-12-08 ENCOUNTER — Ambulatory Visit (INDEPENDENT_AMBULATORY_CARE_PROVIDER_SITE_OTHER): Payer: Medicare Other | Admitting: Cardiology

## 2011-12-08 ENCOUNTER — Encounter: Payer: Self-pay | Admitting: Cardiology

## 2011-12-08 VITALS — BP 160/75 | HR 62 | Ht 63.0 in | Wt 156.1 lb

## 2011-12-08 DIAGNOSIS — R55 Syncope and collapse: Secondary | ICD-10-CM

## 2011-12-08 DIAGNOSIS — I10 Essential (primary) hypertension: Secondary | ICD-10-CM

## 2011-12-08 DIAGNOSIS — I2510 Atherosclerotic heart disease of native coronary artery without angina pectoris: Secondary | ICD-10-CM

## 2011-12-08 NOTE — Progress Notes (Signed)
HPI The patient presents for followup of her CAD.  Since I last saw her she has done well.  The patient denies any new symptoms such as chest discomfort, neck or arm discomfort. There has been no new shortness of breath, PND or orthopnea. There have been no reported palpitations, presyncope or syncope. She is participating in cardiac rehabilitation 3 times a week and lots of this. With this she has no symptoms.  .No Known Allergies  Current Outpatient Prescriptions  Medication Sig Dispense Refill  . amLODipine (NORVASC) 10 MG tablet Take 10 mg by mouth daily.      Marland Kitchen aspirin 81 MG tablet Take 162 mg by mouth daily.       Marland Kitchen glipiZIDE (GLUCOTROL XL) 5 MG 24 hr tablet Take 5 mg by mouth daily.      . isosorbide mononitrate (IMDUR) 60 MG 24 hr tablet Take 60 mg by mouth daily.      Marland Kitchen losartan (COZAAR) 100 MG tablet Take 100 mg by mouth daily.      . metFORMIN (GLUCOPHAGE) 1000 MG tablet Take 1,000 mg by mouth 2 (two) times daily with a meal.       . metoprolol (LOPRESSOR) 50 MG tablet Take 50 mg by mouth 2 (two) times daily.      . [DISCONTINUED] amLODipine (NORVASC) 10 MG tablet TAKE ONE TABLET BY MOUTH DAILY.  30 tablet  3  . [DISCONTINUED] isosorbide mononitrate (IMDUR) 60 MG 24 hr tablet TAKE ONE TABLET BY MOUTH DAILY.  30 tablet  12  . [DISCONTINUED] losartan (COZAAR) 100 MG tablet TAKE ONE TABLET BY MOUTH EVERY DAY  60 tablet  3    Past Medical History  Diagnosis Date  . Shortness of breath   . HTN (hypertension)   . DM (diabetes mellitus)   . Pleural effusion   . CAD (coronary artery disease)     status post stenting of the RCA status post CABG. (left internal mammary artery to  LAD, saphenous vein graft to second diagonal, saphenous vein  graft to obtuse marginal 1, saphenous vein graft to posterior  descending).  . Cardiomyopathy      Mildly reduced EF (45%).     Past Surgical History  Procedure Date  . Bilateral cataract surgery   . Coronary artery bypass graft     ROS:   As stated in the HPI and negative for all other systems.  PHYSICAL EXAM BP 160/75  Pulse 62  Ht 5\' 3"  (1.6 m)  Wt 156 lb 1.9 oz (70.816 kg)  BMI 27.66 kg/m2 GENERAL:  Well appearing NECK:  No jugular venous distention, waveform within normal limits, carotid upstroke brisk and symmetric, no bruits, no thyromegaly LUNGS:  Clear to auscultation bilaterally BACK:  No CVA tenderness CHEST:  Well healed sternotomy scar. HEART:  PMI not displaced or sustained,S1 and S2 within normal limits, no S3, no S4, no clicks, no rubs, no murmurs ABD:  Flat, positive bowel sounds normal in frequency in pitch, no bruits, no rebound, no guarding, no midline pulsatile mass, no hepatomegaly, no splenomegaly EXT:  2 plus pulses throughout, trace right lower leg edema, no cyanosis no clubbing  EKG:  Sinus rhythm, rate 62, leftward axis, poor anterior R wave progression, inferolateral T wave inversions unchanged from previous. 12/08/2011  ASSESSMENT AND PLAN  CAD - The patient has no new sypmtoms. No further cardiovascular testing is indicated. We will continue with aggressive risk reduction and meds as listed. She will continue cardiac rehab.  HYPERTENSION -  The blood pressure is high in the office.  However, it is well controlled and reported frequently at rehabilitation.  No change in medications is indicated. We will continue with therapeutic lifestyle changes (TLC).   DYSLIPIDEMIA -  This is followed closely by Horatio Pel, MD with a goal LDL less than 70 and HDL greater than 50. He

## 2011-12-08 NOTE — Patient Instructions (Addendum)
The current medical regimen is effective;  continue present plan and medications.  Follow up in 6 months with Dr Hochrein.  You will receive a letter in the mail 2 months before you are due.  Please call us when you receive this letter to schedule your follow up appointment.  

## 2011-12-09 ENCOUNTER — Encounter (HOSPITAL_COMMUNITY)
Admission: RE | Admit: 2011-12-09 | Discharge: 2011-12-09 | Disposition: A | Discharge status: Home / Self Care | Payer: Self-pay | Source: Ambulatory Visit | Attending: Cardiology | Admitting: Cardiology

## 2011-12-12 ENCOUNTER — Encounter (HOSPITAL_COMMUNITY)
Admission: RE | Admit: 2011-12-12 | Discharge: 2011-12-12 | Disposition: A | Discharge status: Home / Self Care | Payer: Self-pay | Source: Ambulatory Visit | Attending: Cardiology | Admitting: Cardiology

## 2011-12-14 ENCOUNTER — Encounter (HOSPITAL_COMMUNITY)
Admission: RE | Admit: 2011-12-14 | Discharge: 2011-12-14 | Disposition: A | Discharge status: Home / Self Care | Payer: Self-pay | Source: Ambulatory Visit | Attending: Cardiology | Admitting: Cardiology

## 2011-12-16 ENCOUNTER — Encounter (HOSPITAL_COMMUNITY)
Admission: RE | Admit: 2011-12-16 | Discharge: 2011-12-16 | Disposition: A | Discharge status: Home / Self Care | Payer: Self-pay | Source: Ambulatory Visit | Attending: Cardiology | Admitting: Cardiology

## 2011-12-19 ENCOUNTER — Encounter (HOSPITAL_COMMUNITY)
Admission: RE | Admit: 2011-12-19 | Discharge: 2011-12-19 | Disposition: A | Discharge status: Home / Self Care | Payer: Self-pay | Source: Ambulatory Visit | Attending: Cardiology | Admitting: Cardiology

## 2011-12-21 ENCOUNTER — Encounter (HOSPITAL_COMMUNITY)
Admission: RE | Admit: 2011-12-21 | Discharge: 2011-12-21 | Disposition: A | Discharge status: Home / Self Care | Payer: Self-pay | Source: Ambulatory Visit | Attending: Cardiology | Admitting: Cardiology

## 2011-12-23 ENCOUNTER — Encounter (HOSPITAL_COMMUNITY): Payer: Self-pay

## 2011-12-26 ENCOUNTER — Encounter (HOSPITAL_COMMUNITY): Payer: Self-pay

## 2011-12-28 ENCOUNTER — Encounter (HOSPITAL_COMMUNITY): Payer: Self-pay

## 2011-12-30 ENCOUNTER — Encounter (HOSPITAL_COMMUNITY): Payer: Self-pay

## 2012-01-02 ENCOUNTER — Encounter (HOSPITAL_COMMUNITY)
Admission: RE | Admit: 2012-01-02 | Discharge: 2012-01-02 | Disposition: A | Discharge status: Home / Self Care | Payer: Self-pay | Source: Ambulatory Visit | Attending: Cardiology | Admitting: Cardiology

## 2012-01-02 DIAGNOSIS — E785 Hyperlipidemia, unspecified: Secondary | ICD-10-CM | POA: Insufficient documentation

## 2012-01-02 DIAGNOSIS — Z5189 Encounter for other specified aftercare: Secondary | ICD-10-CM | POA: Insufficient documentation

## 2012-01-02 DIAGNOSIS — I10 Essential (primary) hypertension: Secondary | ICD-10-CM | POA: Insufficient documentation

## 2012-01-02 DIAGNOSIS — E119 Type 2 diabetes mellitus without complications: Secondary | ICD-10-CM | POA: Insufficient documentation

## 2012-01-02 DIAGNOSIS — I2510 Atherosclerotic heart disease of native coronary artery without angina pectoris: Secondary | ICD-10-CM | POA: Insufficient documentation

## 2012-01-04 ENCOUNTER — Encounter (HOSPITAL_COMMUNITY)
Admission: RE | Admit: 2012-01-04 | Discharge: 2012-01-04 | Disposition: A | Discharge status: Home / Self Care | Payer: Self-pay | Source: Ambulatory Visit | Attending: Cardiology | Admitting: Cardiology

## 2012-01-06 ENCOUNTER — Encounter (HOSPITAL_COMMUNITY): Payer: Self-pay

## 2012-01-09 ENCOUNTER — Encounter (HOSPITAL_COMMUNITY): Payer: Self-pay

## 2012-01-11 ENCOUNTER — Encounter (HOSPITAL_COMMUNITY)
Admission: RE | Admit: 2012-01-11 | Discharge: 2012-01-11 | Disposition: A | Discharge status: Home / Self Care | Payer: Self-pay | Source: Ambulatory Visit | Attending: Cardiology | Admitting: Cardiology

## 2012-01-13 ENCOUNTER — Encounter (HOSPITAL_COMMUNITY)
Admission: RE | Admit: 2012-01-13 | Discharge: 2012-01-13 | Disposition: A | Discharge status: Home / Self Care | Payer: Self-pay | Source: Ambulatory Visit | Attending: Cardiology | Admitting: Cardiology

## 2012-01-13 ENCOUNTER — Other Ambulatory Visit: Payer: Self-pay | Admitting: Cardiology

## 2012-01-16 ENCOUNTER — Encounter (HOSPITAL_COMMUNITY)
Admission: RE | Admit: 2012-01-16 | Discharge: 2012-01-16 | Disposition: A | Discharge status: Home / Self Care | Payer: Self-pay | Source: Ambulatory Visit | Attending: Cardiology | Admitting: Cardiology

## 2012-01-18 ENCOUNTER — Encounter (HOSPITAL_COMMUNITY)
Admission: RE | Admit: 2012-01-18 | Discharge: 2012-01-18 | Disposition: A | Discharge status: Home / Self Care | Payer: Self-pay | Source: Ambulatory Visit | Attending: Cardiology | Admitting: Cardiology

## 2012-01-20 ENCOUNTER — Encounter (HOSPITAL_COMMUNITY): Payer: Self-pay

## 2012-01-23 ENCOUNTER — Encounter (HOSPITAL_COMMUNITY): Payer: Self-pay

## 2012-01-25 ENCOUNTER — Encounter (HOSPITAL_COMMUNITY): Payer: Self-pay

## 2012-01-27 ENCOUNTER — Encounter (HOSPITAL_COMMUNITY): Payer: Self-pay

## 2012-01-30 ENCOUNTER — Encounter (HOSPITAL_COMMUNITY): Payer: Self-pay

## 2012-02-01 ENCOUNTER — Encounter (HOSPITAL_COMMUNITY): Payer: Self-pay

## 2012-02-03 ENCOUNTER — Encounter (HOSPITAL_COMMUNITY): Payer: Self-pay

## 2012-02-03 DIAGNOSIS — E119 Type 2 diabetes mellitus without complications: Secondary | ICD-10-CM | POA: Insufficient documentation

## 2012-02-03 DIAGNOSIS — E785 Hyperlipidemia, unspecified: Secondary | ICD-10-CM | POA: Insufficient documentation

## 2012-02-03 DIAGNOSIS — Z5189 Encounter for other specified aftercare: Secondary | ICD-10-CM | POA: Insufficient documentation

## 2012-02-03 DIAGNOSIS — I10 Essential (primary) hypertension: Secondary | ICD-10-CM | POA: Insufficient documentation

## 2012-02-03 DIAGNOSIS — I2510 Atherosclerotic heart disease of native coronary artery without angina pectoris: Secondary | ICD-10-CM | POA: Insufficient documentation

## 2012-02-06 ENCOUNTER — Encounter (HOSPITAL_COMMUNITY): Payer: Self-pay

## 2012-02-08 ENCOUNTER — Encounter (HOSPITAL_COMMUNITY)
Admission: RE | Admit: 2012-02-08 | Discharge: 2012-02-08 | Disposition: A | Discharge status: Home / Self Care | Payer: Self-pay | Source: Ambulatory Visit | Attending: Cardiology | Admitting: Cardiology

## 2012-02-10 ENCOUNTER — Encounter (HOSPITAL_COMMUNITY): Payer: Self-pay

## 2012-02-13 ENCOUNTER — Encounter (HOSPITAL_COMMUNITY)
Admission: RE | Admit: 2012-02-13 | Discharge: 2012-02-13 | Disposition: A | Discharge status: Home / Self Care | Payer: Self-pay | Source: Ambulatory Visit | Attending: Cardiology | Admitting: Cardiology

## 2012-02-15 ENCOUNTER — Encounter (HOSPITAL_COMMUNITY)
Admission: RE | Admit: 2012-02-15 | Discharge: 2012-02-15 | Disposition: A | Discharge status: Home / Self Care | Payer: Self-pay | Source: Ambulatory Visit | Attending: Cardiology | Admitting: Cardiology

## 2012-02-17 ENCOUNTER — Encounter (HOSPITAL_COMMUNITY)
Admission: RE | Admit: 2012-02-17 | Discharge: 2012-02-17 | Disposition: A | Discharge status: Home / Self Care | Payer: Self-pay | Source: Ambulatory Visit | Attending: Cardiology | Admitting: Cardiology

## 2012-02-20 ENCOUNTER — Encounter (HOSPITAL_COMMUNITY)
Admission: RE | Admit: 2012-02-20 | Discharge: 2012-02-20 | Disposition: A | Discharge status: Home / Self Care | Payer: Self-pay | Source: Ambulatory Visit | Attending: Cardiology | Admitting: Cardiology

## 2012-02-22 ENCOUNTER — Encounter (HOSPITAL_COMMUNITY): Payer: Self-pay

## 2012-02-24 ENCOUNTER — Encounter (HOSPITAL_COMMUNITY): Payer: Self-pay

## 2012-02-27 ENCOUNTER — Encounter (HOSPITAL_COMMUNITY)
Admission: RE | Admit: 2012-02-27 | Discharge: 2012-02-27 | Disposition: A | Discharge status: Home / Self Care | Payer: Self-pay | Source: Ambulatory Visit | Attending: Cardiology | Admitting: Cardiology

## 2012-02-29 ENCOUNTER — Encounter (HOSPITAL_COMMUNITY): Payer: Self-pay

## 2012-03-02 ENCOUNTER — Encounter (HOSPITAL_COMMUNITY)
Admission: RE | Admit: 2012-03-02 | Discharge: 2012-03-02 | Disposition: A | Discharge status: Home / Self Care | Payer: Self-pay | Source: Ambulatory Visit | Attending: Cardiology | Admitting: Cardiology

## 2012-03-05 ENCOUNTER — Encounter (HOSPITAL_COMMUNITY): Payer: Medicare Other | Attending: Cardiology

## 2012-03-05 DIAGNOSIS — I10 Essential (primary) hypertension: Secondary | ICD-10-CM | POA: Insufficient documentation

## 2012-03-05 DIAGNOSIS — I2510 Atherosclerotic heart disease of native coronary artery without angina pectoris: Secondary | ICD-10-CM | POA: Insufficient documentation

## 2012-03-05 DIAGNOSIS — E119 Type 2 diabetes mellitus without complications: Secondary | ICD-10-CM | POA: Insufficient documentation

## 2012-03-05 DIAGNOSIS — E785 Hyperlipidemia, unspecified: Secondary | ICD-10-CM | POA: Insufficient documentation

## 2012-03-05 DIAGNOSIS — Z5189 Encounter for other specified aftercare: Secondary | ICD-10-CM | POA: Insufficient documentation

## 2012-03-07 ENCOUNTER — Encounter (HOSPITAL_COMMUNITY): Payer: Medicare Other

## 2012-03-09 ENCOUNTER — Encounter (HOSPITAL_COMMUNITY): Payer: Medicare Other

## 2012-03-12 ENCOUNTER — Encounter (HOSPITAL_COMMUNITY): Payer: Medicare Other

## 2012-03-14 ENCOUNTER — Encounter (HOSPITAL_COMMUNITY): Payer: Medicare Other

## 2012-03-16 ENCOUNTER — Encounter (HOSPITAL_COMMUNITY): Payer: Medicare Other

## 2012-03-19 ENCOUNTER — Encounter (HOSPITAL_COMMUNITY): Payer: Medicare Other

## 2012-03-21 ENCOUNTER — Encounter (HOSPITAL_COMMUNITY): Payer: Medicare Other

## 2012-03-23 ENCOUNTER — Encounter (HOSPITAL_COMMUNITY): Payer: Medicare Other

## 2012-03-26 ENCOUNTER — Encounter (HOSPITAL_COMMUNITY): Payer: Medicare Other

## 2012-03-28 ENCOUNTER — Encounter (HOSPITAL_COMMUNITY): Payer: Medicare Other

## 2012-03-30 ENCOUNTER — Encounter (HOSPITAL_COMMUNITY): Payer: Medicare Other

## 2012-04-02 ENCOUNTER — Encounter (HOSPITAL_COMMUNITY): Payer: Medicare Other

## 2012-04-04 ENCOUNTER — Encounter (HOSPITAL_COMMUNITY): Payer: Medicare Other

## 2012-04-06 ENCOUNTER — Encounter (HOSPITAL_COMMUNITY): Payer: Medicare Other

## 2012-04-09 ENCOUNTER — Encounter (HOSPITAL_COMMUNITY): Payer: Medicare Other

## 2012-04-11 ENCOUNTER — Encounter (HOSPITAL_COMMUNITY): Payer: Medicare Other

## 2012-04-13 ENCOUNTER — Encounter (HOSPITAL_COMMUNITY): Payer: Medicare Other

## 2012-04-16 ENCOUNTER — Encounter (HOSPITAL_COMMUNITY): Payer: Medicare Other

## 2012-04-18 ENCOUNTER — Encounter (HOSPITAL_COMMUNITY): Payer: Medicare Other

## 2012-04-20 ENCOUNTER — Encounter (HOSPITAL_COMMUNITY): Payer: Medicare Other

## 2012-04-23 ENCOUNTER — Encounter (HOSPITAL_COMMUNITY): Payer: Medicare Other

## 2012-04-25 ENCOUNTER — Encounter (HOSPITAL_COMMUNITY): Payer: Medicare Other

## 2012-04-27 ENCOUNTER — Encounter (HOSPITAL_COMMUNITY): Payer: Medicare Other

## 2012-04-30 ENCOUNTER — Encounter (HOSPITAL_COMMUNITY): Payer: Medicare Other

## 2012-05-02 ENCOUNTER — Encounter (HOSPITAL_COMMUNITY): Payer: Medicare Other

## 2012-05-04 ENCOUNTER — Encounter (HOSPITAL_COMMUNITY): Payer: Medicare Other

## 2012-05-07 ENCOUNTER — Encounter (HOSPITAL_COMMUNITY): Payer: Medicare Other

## 2012-05-07 ENCOUNTER — Other Ambulatory Visit: Payer: Self-pay | Admitting: Cardiology

## 2012-05-09 ENCOUNTER — Encounter (HOSPITAL_COMMUNITY): Payer: Medicare Other

## 2012-05-10 ENCOUNTER — Other Ambulatory Visit: Payer: Self-pay | Admitting: Cardiology

## 2012-05-11 ENCOUNTER — Encounter (HOSPITAL_COMMUNITY): Payer: Medicare Other

## 2012-05-14 ENCOUNTER — Encounter (HOSPITAL_COMMUNITY): Payer: Medicare Other

## 2012-05-16 ENCOUNTER — Encounter (HOSPITAL_COMMUNITY): Payer: Medicare Other

## 2012-05-18 ENCOUNTER — Encounter (HOSPITAL_COMMUNITY): Payer: Medicare Other

## 2012-05-21 ENCOUNTER — Encounter (HOSPITAL_COMMUNITY): Payer: Medicare Other

## 2012-05-23 ENCOUNTER — Encounter (HOSPITAL_COMMUNITY): Payer: Medicare Other

## 2012-05-25 ENCOUNTER — Encounter (HOSPITAL_COMMUNITY): Payer: Medicare Other

## 2012-05-28 ENCOUNTER — Encounter (HOSPITAL_COMMUNITY): Payer: Medicare Other

## 2012-05-30 ENCOUNTER — Encounter (HOSPITAL_COMMUNITY): Payer: Medicare Other

## 2012-06-15 ENCOUNTER — Other Ambulatory Visit: Payer: Self-pay | Admitting: Cardiology

## 2012-07-13 ENCOUNTER — Other Ambulatory Visit: Payer: Self-pay | Admitting: Cardiology

## 2012-08-06 ENCOUNTER — Other Ambulatory Visit: Payer: Self-pay | Admitting: Cardiology

## 2012-09-05 ENCOUNTER — Other Ambulatory Visit: Payer: Self-pay | Admitting: Cardiology

## 2012-09-10 ENCOUNTER — Other Ambulatory Visit: Payer: Self-pay | Admitting: Cardiology

## 2012-09-16 ENCOUNTER — Other Ambulatory Visit: Payer: Self-pay | Admitting: Cardiology

## 2012-10-23 ENCOUNTER — Other Ambulatory Visit: Payer: Self-pay | Admitting: Cardiology

## 2012-11-08 ENCOUNTER — Other Ambulatory Visit: Payer: Self-pay | Admitting: Cardiology

## 2012-11-19 ENCOUNTER — Other Ambulatory Visit: Payer: Self-pay | Admitting: Cardiology

## 2012-12-04 ENCOUNTER — Ambulatory Visit (INDEPENDENT_AMBULATORY_CARE_PROVIDER_SITE_OTHER): Payer: Medicare Other | Admitting: Cardiology

## 2012-12-04 ENCOUNTER — Encounter: Payer: Self-pay | Admitting: Cardiology

## 2012-12-04 VITALS — BP 180/72 | HR 66 | Ht 63.0 in | Wt 169.0 lb

## 2012-12-04 DIAGNOSIS — I2510 Atherosclerotic heart disease of native coronary artery without angina pectoris: Secondary | ICD-10-CM

## 2012-12-04 NOTE — Patient Instructions (Signed)
The current medical regimen is effective;  continue present plan and medications. Let us know what your blood pressure is tomorrow when you see Dr Shelia Media.  [phone]  Please keep a blood pressure diary and bring it by in one month.  Follow up in 6 months with Dr Percival Spanish.  You will receive a letter in the mail 2 months before you are due.  Please call us when you receive this letter to schedule your follow up appointment.

## 2012-12-04 NOTE — Progress Notes (Signed)
HPI The patient presents for followup of her CAD.  Since I last saw her she has done well.  The patient denies any new symptoms such as chest discomfort, neck or arm discomfort. There has been no new shortness of breath, PND or orthopnea. There have been no reported palpitations, presyncope or syncope. She is working out with a Water engineer 3 x per week.  With this she denies any cardiovascular symptoms.   .No Known Allergies  Current Outpatient Prescriptions  Medication Sig Dispense Refill  . amLODipine (NORVASC) 10 MG tablet Take 10 mg by mouth daily.      Marland Kitchen aspirin 81 MG tablet Take 162 mg by mouth daily.       Marland Kitchen glipiZIDE (GLUCOTROL XL) 5 MG 24 hr tablet Take 5 mg by mouth daily.      . isosorbide mononitrate (IMDUR) 60 MG 24 hr tablet Take 60 mg by mouth daily.      . isosorbide mononitrate (IMDUR) 60 MG 24 hr tablet TAKE ONE TABLET BY MOUTH ONCE DAILY. NEED OFFICE VISIT  30 tablet  0  . losartan (COZAAR) 100 MG tablet Take 100 mg by mouth daily.      . metoprolol (LOPRESSOR) 50 MG tablet TAKE ONE TABLET BY MOUTH TWICE DAILY  60 tablet  0  . amLODipine (NORVASC) 10 MG tablet TAKE ONE TABLET BY MOUTH ONCE DAILY  30 tablet  3  . losartan (COZAAR) 100 MG tablet TAKE ONE TABLET BY MOUTH ONCE DAILY  30 tablet  0  . metFORMIN (GLUCOPHAGE) 1000 MG tablet Take 1,000 mg by mouth 2 (two) times daily with a meal.       . metoprolol (LOPRESSOR) 50 MG tablet TAKE ONE TABLET BY MOUTH TWICE DAILY  60 tablet  0   No current facility-administered medications for this visit.    Past Medical History  Diagnosis Date  . Shortness of breath   . HTN (hypertension)   . DM (diabetes mellitus)   . Pleural effusion   . CAD (coronary artery disease)     status post stenting of the RCA status post CABG. (left internal mammary artery to  LAD, saphenous vein graft to second diagonal, saphenous vein  graft to obtuse marginal 1, saphenous vein graft to posterior  descending).  . Cardiomyopathy      Mildly  reduced EF (45%).     Past Surgical History  Procedure Laterality Date  . Bilateral cataract surgery    . Coronary artery bypass graft      ROS:  As stated in the HPI and negative for all other systems.  PHYSICAL EXAM BP 180/72  Pulse 66  Ht 5\' 3"  (1.6 m)  Wt 169 lb (76.658 kg)  BMI 29.94 kg/m2 GENERAL:  Well appearing NECK:  No jugular venous distention, waveform within normal limits, carotid upstroke brisk and symmetric, no bruits, no thyromegaly LUNGS:  Clear to auscultation bilaterally BACK:  No CVA tenderness CHEST:  Well healed sternotomy scar. HEART:  PMI not displaced or sustained,S1 and S2 within normal limits, no S3, no S4, no clicks, no rubs, no murmurs ABD:  Flat, positive bowel sounds normal in frequency in pitch, no bruits, no rebound, no guarding, no midline pulsatile mass, no hepatomegaly, no splenomegaly EXT:  2 plus pulses throughout, mild right lower leg edema, no cyanosis no clubbing  EKG:  Sinus rhythm, rate 66, leftward axis, poor anterior R wave progression, inferolateral T wave inversions unchanged from previous. 12/04/2012  ASSESSMENT AND PLAN  CAD - The patient has no new sypmtoms. No further cardiovascular testing is indicated. We will continue with aggressive risk reduction and meds as listed. She will continue cardiac rehab.  HYPERTENSION -  The blood pressure is high in the office.  However, it is has been good at home.  No change in medications is indicated. We will continue with therapeutic lifestyle changes (TLC).  I he is going to get a blood pressure check tomorrow at her primary care office. She's also going to keep a blood pressure diary.  I may need to adjust her medicine going forward.  DYSLIPIDEMIA -  This is followed closely by Horatio Pel, MD. I will defer to his management.

## 2012-12-09 ENCOUNTER — Other Ambulatory Visit: Payer: Self-pay | Admitting: Cardiology

## 2012-12-23 ENCOUNTER — Other Ambulatory Visit: Payer: Self-pay | Admitting: Cardiology

## 2013-01-12 ENCOUNTER — Other Ambulatory Visit: Payer: Self-pay | Admitting: Cardiology

## 2013-06-02 ENCOUNTER — Other Ambulatory Visit: Payer: Self-pay | Admitting: Cardiology

## 2013-06-11 ENCOUNTER — Encounter: Payer: Self-pay | Admitting: Cardiology

## 2013-06-11 ENCOUNTER — Ambulatory Visit (INDEPENDENT_AMBULATORY_CARE_PROVIDER_SITE_OTHER): Payer: Medicare Other | Admitting: Cardiology

## 2013-06-11 VITALS — BP 158/80 | HR 64 | Ht 63.0 in | Wt 162.8 lb

## 2013-06-11 DIAGNOSIS — I2510 Atherosclerotic heart disease of native coronary artery without angina pectoris: Secondary | ICD-10-CM

## 2013-06-11 DIAGNOSIS — I10 Essential (primary) hypertension: Secondary | ICD-10-CM

## 2013-06-11 DIAGNOSIS — I428 Other cardiomyopathies: Secondary | ICD-10-CM

## 2013-06-11 MED ORDER — ISOSORBIDE MONONITRATE ER 60 MG PO TB24: ORAL_TABLET | ORAL | Status: DC

## 2013-06-11 MED ORDER — METOPROLOL TARTRATE 50 MG PO TABS: ORAL_TABLET | ORAL | Status: DC

## 2013-06-11 MED ORDER — AMLODIPINE BESYLATE 10 MG PO TABS: 10.0000 mg | ORAL_TABLET | Freq: Every day | ORAL | Status: DC

## 2013-06-11 MED ORDER — LOSARTAN POTASSIUM 100 MG PO TABS: ORAL_TABLET | ORAL | Status: DC

## 2013-06-11 NOTE — Progress Notes (Signed)
   HPI The patient presents for followup of her CAD.  Since I last saw her she has done well.  The patient denies any new symptoms such as chest discomfort, neck or arm discomfort. There has been no new shortness of breath, PND or orthopnea. There have been no reported palpitations, presyncope or syncope. She is still working out with a Water engineer 3 x per week.  With this she denies any cardiovascular symptoms.   .No Known Allergies  Current Outpatient Prescriptions  Medication Sig Dispense Refill  . amLODipine (NORVASC) 10 MG tablet Take 10 mg by mouth daily.      Marland Kitchen aspirin 81 MG tablet Take 162 mg by mouth daily.       Marland Kitchen glipiZIDE (GLUCOTROL XL) 5 MG 24 hr tablet Take 5 mg by mouth daily.      . isosorbide mononitrate (IMDUR) 60 MG 24 hr tablet TAKE ONE TABLET BY MOUTH ONCE DAILY  30 tablet  1  . losartan (COZAAR) 100 MG tablet TAKE ONE TABLET BY MOUTH ONCE DAILY  30 tablet  6  . metFORMIN (GLUCOPHAGE) 1000 MG tablet Take 1,000 mg by mouth 2 (two) times daily with a meal.       . metoprolol (LOPRESSOR) 50 MG tablet TAKE ONE TABLET BY MOUTH TWICE DAILY  60 tablet  6   No current facility-administered medications for this visit.    Past Medical History  Diagnosis Date  . Shortness of breath   . HTN (hypertension)   . DM (diabetes mellitus)   . Pleural effusion   . CAD (coronary artery disease)     status post stenting of the RCA status post CABG. (left internal mammary artery to  LAD, saphenous vein graft to second diagonal, saphenous vein  graft to obtuse marginal 1, saphenous vein graft to posterior  descending).  . Cardiomyopathy      Mildly reduced EF (45%).     Past Surgical History  Procedure Laterality Date  . Bilateral cataract surgery    . Coronary artery bypass graft      ROS:  As stated in the HPI and negative for all other systems.  PHYSICAL EXAM There were no vitals taken for this visit. GENERAL:  Well appearing NECK:  No jugular venous distention, waveform  within normal limits, carotid upstroke brisk and symmetric, no bruits, no thyromegaly LUNGS:  Clear to auscultation bilaterally BACK:  No CVA tenderness CHEST:  Well healed sternotomy scar. HEART:  PMI not displaced or sustained,S1 and S2 within normal limits, no S3, no S4, no clicks, no rubs, no murmurs ABD:  Flat, positive bowel sounds normal in frequency in pitch, no bruits, no rebound, no guarding, no midline pulsatile mass, no hepatomegaly, no splenomegaly EXT:  2 plus pulses throughout, mild right greater than left lower leg edema, no cyanosis no clubbing  ASSESSMENT AND PLAN  CAD - The patient has no new sypmtoms. No further cardiovascular testing is indicated. We will continue with aggressive risk reduction and meds as listed.   HYPERTENSION -  The blood pressure is high in the office.  However, it is has been good at home.  No change in medications is indicated. We will continue with therapeutic lifestyle changes (TLC).    DYSLIPIDEMIA -  This is followed closely by Horatio Pel, MD. I will defer to his management.

## 2013-06-11 NOTE — Patient Instructions (Signed)
The current medical regimen is effective;  continue present plan and medications.  Follow up in 6 months with Dr Percival Spanish at the [facility] office.  You will receive a letter in the mail 2 months before you are due.  Please call us when you receive this letter to schedule your follow up appointment.

## 2013-06-27 ENCOUNTER — Encounter (HOSPITAL_COMMUNITY): Payer: Self-pay | Admitting: Emergency Medicine

## 2013-06-27 ENCOUNTER — Emergency Department (HOSPITAL_COMMUNITY)
Admission: EM | Admit: 2013-06-27 | Discharge: 2013-06-27 | Disposition: A | Discharge status: Home / Self Care | Payer: Medicare Other | Attending: Emergency Medicine | Admitting: Emergency Medicine

## 2013-06-27 DIAGNOSIS — E119 Type 2 diabetes mellitus without complications: Secondary | ICD-10-CM | POA: Insufficient documentation

## 2013-06-27 DIAGNOSIS — Z951 Presence of aortocoronary bypass graft: Secondary | ICD-10-CM | POA: Insufficient documentation

## 2013-06-27 DIAGNOSIS — Z8709 Personal history of other diseases of the respiratory system: Secondary | ICD-10-CM | POA: Insufficient documentation

## 2013-06-27 DIAGNOSIS — Z7982 Long term (current) use of aspirin: Secondary | ICD-10-CM | POA: Insufficient documentation

## 2013-06-27 DIAGNOSIS — I428 Other cardiomyopathies: Secondary | ICD-10-CM | POA: Insufficient documentation

## 2013-06-27 DIAGNOSIS — I10 Essential (primary) hypertension: Secondary | ICD-10-CM

## 2013-06-27 DIAGNOSIS — I2510 Atherosclerotic heart disease of native coronary artery without angina pectoris: Secondary | ICD-10-CM | POA: Insufficient documentation

## 2013-06-27 DIAGNOSIS — Z79899 Other long term (current) drug therapy: Secondary | ICD-10-CM | POA: Insufficient documentation

## 2013-06-27 MED ORDER — METOPROLOL TARTRATE 25 MG PO TABS
50.0000 mg | ORAL_TABLET | Freq: Once | ORAL | Status: AC
  Administered 2013-06-27: 50 mg via ORAL
  Filled 2013-06-27: qty 2

## 2013-06-27 MED ORDER — AMLODIPINE BESYLATE 10 MG PO TABS
10.0000 mg | ORAL_TABLET | Freq: Once | ORAL | Status: AC
  Administered 2013-06-27: 10 mg via ORAL
  Filled 2013-06-27: qty 1

## 2013-06-27 MED ORDER — ISOSORBIDE MONONITRATE ER 60 MG PO TB24
60.0000 mg | ORAL_TABLET | Freq: Every day | ORAL | Status: DC
  Administered 2013-06-27: 60 mg via ORAL
  Filled 2013-06-27: qty 1

## 2013-06-27 MED ORDER — LOSARTAN POTASSIUM 50 MG PO TABS
100.0000 mg | ORAL_TABLET | Freq: Once | ORAL | Status: AC
  Administered 2013-06-27: 100 mg via ORAL
  Filled 2013-06-27: qty 2

## 2013-06-27 NOTE — ED Provider Notes (Signed)
CSN: [id number]     Arrival date & time 06/27/13  1044 History   First MD Initiated Contact with Patient 06/27/13 1058     Chief Complaint  Patient presents with  . Hypertension     (Consider location/radiation/quality/duration/timing/severity/associated sxs/prior Treatment) HPI Comments: Patient is an 80 yo F PMHx significant for HTN, DM, CAD  Presenting to the ED from her PCP office for elevated blood pressure, 200/80 at the office. Patient states she did not take any of her medications this morning because she was having blood work done and that she was told not to eat or drink anything this morning. She states she has been feeling well. Denies any headaches, visual disturbances, CP, SOB, abdominal pain, nausea, vomiting, diarrhea, fevers, chills. Patient took her home medications yesterday without issue.   Patient is a 80 y.o. female presenting with hypertension.  Hypertension    Past Medical History  Diagnosis Date  . Shortness of breath   . HTN (hypertension)   . DM (diabetes mellitus)   . Pleural effusion   . CAD (coronary artery disease)     status post stenting of the RCA status post CABG. (left internal mammary artery to  LAD, saphenous vein graft to second diagonal, saphenous vein  graft to obtuse marginal 1, saphenous vein graft to posterior  descending).  . Cardiomyopathy      Mildly reduced EF (45%).    Past Surgical History  Procedure Laterality Date  . Bilateral cataract surgery    . Coronary artery bypass graft     No family history on file. History  Substance Use Topics  . Smoking status: Never Smoker   . Smokeless tobacco: Not on file  . Alcohol Use: Not on file   OB History   Grav Para Term Preterm Abortions TAB SAB Ect Mult Living                 Review of Systems  All other systems reviewed and are negative.     Allergies  Lisinopril  Home Medications   Prior to Admission medications   Medication Sig Start Date End Date Taking?  Authorizing Provider  amLODipine (NORVASC) 10 MG tablet Take 1 tablet (10 mg total) by mouth daily. 06/11/13   Minus Breeding, MD  aspirin 81 MG tablet Take 162 mg by mouth daily.     Historical Provider, MD  glipiZIDE (GLUCOTROL XL) 5 MG 24 hr tablet Take 5 mg by mouth daily.    Historical Provider, MD  isosorbide mononitrate (IMDUR) 60 MG 24 hr tablet TAKE ONE TABLET BY MOUTH ONCE DAILY 06/11/13   Minus Breeding, MD  losartan (COZAAR) 100 MG tablet TAKE ONE TABLET BY MOUTH ONCE DAILY 06/11/13   Minus Breeding, MD  metoprolol (LOPRESSOR) 50 MG tablet TAKE ONE TABLET BY MOUTH TWICE DAILY 06/11/13   Minus Breeding, MD   BP 165/66  Pulse 64  Temp(Src) 97 F (36.1 C) (Oral)  Resp 20  SpO2 99% Physical Exam  Nursing note and vitals reviewed. Constitutional: She is oriented to person, place, and time. She appears well-developed and well-nourished. No distress.  HENT:  Head: Normocephalic and atraumatic.  Right Ear: External ear normal.  Left Ear: External ear normal.  Nose: Nose normal.  Mouth/Throat: Oropharynx is clear and moist. No oropharyngeal exudate.  Eyes: Conjunctivae, EOM and lids are normal. Pupils are equal, round, and reactive to light.  Fundoscopic exam:      The right eye shows no hemorrhage and no papilledema.  The left eye shows no hemorrhage and no papilledema.  Neck: Normal range of motion. Neck supple.  Cardiovascular: Normal rate, regular rhythm and normal heart sounds.   Pulmonary/Chest: Effort normal and breath sounds normal. No respiratory distress.  Abdominal: Soft. Bowel sounds are normal. There is no tenderness.  Musculoskeletal: Normal range of motion. She exhibits no edema.  Moves all extremities w/o ataxia.   Neurological: She is alert and oriented to person, place, and time.  Skin: Skin is warm and dry. She is not diaphoretic.  Psychiatric: She has a normal mood and affect.    ED Course  Procedures (including critical care time) Medications   isosorbide mononitrate (IMDUR) 24 hr tablet 60 mg (60 mg Oral Given 06/27/13 1140)  metoprolol tartrate (LOPRESSOR) tablet 50 mg (50 mg Oral Given 06/27/13 1123)  losartan (COZAAR) tablet 100 mg (100 mg Oral Given 06/27/13 1139)  amLODipine (NORVASC) tablet 10 mg (10 mg Oral Given 06/27/13 1139)    Labs Review Labs Reviewed - No data to display  Imaging Review No results found.   EKG Interpretation None      MDM   Final diagnoses:  Hypertension    Filed Vitals:   06/27/13 1250  BP: 165/66  Pulse: 64  Temp:   Resp: 20   Afebrile, NAD, non-toxic appearing, AAOx4.   Patient presenting from PCP office for elevated BP. Patient has no CP, SOB, HA, numbness or weakness, visual disturbances. No symptoms to suggest hypertensive urgency. BP likely elevated d/t missing her home medications this morning. Plan to order patient's home blood pressure medications and ensure BP lowers.   Patient noted to be hypertensive in the emergency department.  No signs of hypertensive urgency. Blood pressure came down with administration of daily medications. Discussed with patient the need for close follow-up and management by their primary care physician.   Patient is agreeable to plan. Patient is stable at time of discharge Patient d/w with Dr. Wilson Singer, agrees with plan.    Harlow Mares, PA-C 06/27/13 1337

## 2013-06-27 NOTE — Discharge Instructions (Signed)
Please follow up with your primary care physician in 1-2 days. If you do not have one please call the [facility] number listed above. Please take your at home medications as prescribed. You may take your home medications with a sip of water the morning of your blood work. Please read all discharge instructions and return precautions.    Hypertension As your heart beats, it forces blood through your arteries. This force is your blood pressure. If the pressure is too high, it is called hypertension (HTN) or high blood pressure. HTN is dangerous because you may have it and not know it. High blood pressure may mean that your heart has to work harder to pump blood. Your arteries may be narrow or stiff. The extra work puts you at risk for heart disease, stroke, and other problems.  Blood pressure consists of two numbers, a higher number over a lower, 110/72, for example. It is stated as "110 over 72." The ideal is below 120 for the top number (systolic) and under 80 for the bottom (diastolic). Write down your blood pressure today. You should pay close attention to your blood pressure if you have certain conditions such as:  Heart failure.  Prior heart attack.  Diabetes  Chronic kidney disease.  Prior stroke.  Multiple risk factors for heart disease. To see if you have HTN, your blood pressure should be measured while you are seated with your arm held at the level of the heart. It should be measured at least twice. A one-time elevated blood pressure reading (especially in the Emergency Department) does not mean that you need treatment. There may be conditions in which the blood pressure is different between your right and left arms. It is important to see your caregiver soon for a recheck. Most people have essential hypertension which means that there is not a specific cause. This type of high blood pressure may be lowered by changing lifestyle factors such  as:  Stress.  Smoking.  Lack of exercise.  Excessive weight.  Drug/tobacco/alcohol use.  Eating less salt. Most people do not have symptoms from high blood pressure until it has caused damage to the body. Effective treatment can often prevent, delay or reduce that damage. TREATMENT  When a cause has been identified, treatment for high blood pressure is directed at the cause. There are a large number of medications to treat HTN. These fall into several categories, and your caregiver will help you select the medicines that are best for you. Medications may have side effects. You should review side effects with your caregiver. If your blood pressure stays high after you have made lifestyle changes or started on medicines,   Your medication(s) may need to be changed.  Other problems may need to be addressed.  Be certain you understand your prescriptions, and know how and when to take your medicine.  Be sure to follow up with your caregiver within the time frame advised (usually within two weeks) to have your blood pressure rechecked and to review your medications.  If you are taking more than one medicine to lower your blood pressure, make sure you know how and at what times they should be taken. Taking two medicines at the same time can result in blood pressure that is too low. SEEK IMMEDIATE MEDICAL CARE IF:  You develop a severe headache, blurred or changing vision, or confusion.  You have unusual weakness or numbness, or a faint feeling.  You have severe chest or abdominal  pain, vomiting, or breathing problems. MAKE SURE YOU:   Understand these instructions.  Will watch your condition.  Will get help right away if you are not doing well or get worse. Document Released: 01/17/2005 Document Revised: 04/11/2011 Document Reviewed: 09/07/2007 [facility] Patient Information 2014 [place].

## 2013-06-27 NOTE — ED Notes (Signed)
Per EMS - pt coming from Dr. Pennie Banter office. Pt went to doctors office for routine check up, was hypertensive on assessment so they sent her here. Pt denies taking meds this morning because she was having blood work done. Denies cp/sob. BP at office was 200/80, EMS BP 233/117. HR 92. CBG 133, hx of DM.

## 2013-07-04 NOTE — ED Provider Notes (Signed)
Medical screening examination/treatment/procedure(s) were conducted as a shared visit with non-physician practitioner(s) and myself.  I personally evaluated the patient during the encounter.   EKG Interpretation   Date/Time:  Thursday Jun 27 2013 11:02:15 EDT Ventricular Rate:  87 PR Interval:  152 QRS Duration: 85 QT Interval:  399 QTC Calculation: 480 R Axis:   -9 Text Interpretation:  Sinus rhythm Probable left atrial enlargement  Nonspecific T abnormalities, lateral leads ED PHYSICIAN INTERPRETATION  AVAILABLE IN [facility] Confirmed by TEST, Record ([id number]) on  06/29/2013 10:47:35 AM     85yF with HTN. Did not take home meds today. She is asymptomatic. No indication for emergent reduction. Return precautions discussed.   Virgel Manifold, MD 07/04/13 [phone]

## 2014-01-06 ENCOUNTER — Ambulatory Visit (INDEPENDENT_AMBULATORY_CARE_PROVIDER_SITE_OTHER): Payer: Medicare Other | Admitting: Cardiology

## 2014-01-06 ENCOUNTER — Encounter: Payer: Self-pay | Admitting: Cardiology

## 2014-01-06 VITALS — BP 182/90 | HR 66 | Ht 63.0 in | Wt 167.0 lb

## 2014-01-06 DIAGNOSIS — I2510 Atherosclerotic heart disease of native coronary artery without angina pectoris: Secondary | ICD-10-CM

## 2014-01-06 DIAGNOSIS — I10 Essential (primary) hypertension: Secondary | ICD-10-CM

## 2014-01-06 NOTE — Progress Notes (Signed)
HPI The patient presents for followup of her CAD.  Since I last saw her she has done well.  The patient denies any new symptoms such as chest discomfort, neck or arm discomfort. There has been no new shortness of breath, PND or orthopnea. There have been no reported palpitations, presyncope or syncope. She is still working out with a Water engineer 3 x per week.  With this she denies any cardiovascular symptoms. She was in the ED in May with hypertensive urgency. I reviewed these records. Her blood pressure has been elevated.    . Allergies  Allergen Reactions  . Lisinopril Rash    Current Outpatient Prescriptions  Medication Sig Dispense Refill  . amLODipine (NORVASC) 10 MG tablet Take 1 tablet (10 mg total) by mouth daily. 90 tablet 3  . aspirin 81 MG tablet Take 162 mg by mouth daily.     Marland Kitchen glipiZIDE (GLUCOTROL XL) 5 MG 24 hr tablet Take 5 mg by mouth daily.    . isosorbide mononitrate (IMDUR) 60 MG 24 hr tablet Take 60 mg by mouth daily.    Marland Kitchen losartan (COZAAR) 50 MG tablet Take 50 mg by mouth daily.    . metoprolol (LOPRESSOR) 50 MG tablet Take 50 mg by mouth 2 (two) times daily.    . rosuvastatin (CRESTOR) 20 MG tablet Take 10 mg by mouth daily.     No current facility-administered medications for this visit.    Past Medical History  Diagnosis Date  . Shortness of breath   . HTN (hypertension)   . DM (diabetes mellitus)   . Pleural effusion   . CAD (coronary artery disease)     status post stenting of the RCA status post CABG. (left internal mammary artery to  LAD, saphenous vein graft to second diagonal, saphenous vein  graft to obtuse marginal 1, saphenous vein graft to posterior  descending).  . Cardiomyopathy      Mildly reduced EF (45%).     Past Surgical History  Procedure Laterality Date  . Bilateral cataract surgery    . Coronary artery bypass graft      ROS:  As stated in the HPI and negative for all other systems.  PHYSICAL EXAM BP 182/90 mmHg  Pulse 66   Ht 5\' 3"  (1.6 m)  Wt 167 lb (75.751 kg)  BMI 29.59 kg/m2 GENERAL:  Well appearing NECK:  No jugular venous distention, waveform within normal limits, carotid upstroke brisk and symmetric, no bruits, no thyromegaly LUNGS:  Clear to auscultation bilaterally BACK:  No CVA tenderness CHEST:  Well healed sternotomy scar. HEART:  PMI not displaced or sustained,S1 and S2 within normal limits, no S3, no S4, no clicks, no rubs, no murmurs ABD:  Flat, positive bowel sounds normal in frequency in pitch, no bruits, no rebound, no guarding, no midline pulsatile mass, no hepatomegaly, no splenomegaly EXT:  2 plus pulses throughout, mild right greater than left lower leg edema, no cyanosis no clubbing  EKG:  Sinus rhythm, rate 66, axis within normal limits, intervals within normal limits, no acute ST-T wave changes.  Lateral T-wave inversions unchanged from previous. Poor anterior R wave progression.  01/06/2014  ASSESSMENT AND PLAN  CAD - The patient has no new sypmtoms. No further cardiovascular testing is indicated. We will continue with aggressive risk reduction and meds as listed.   HYPERTENSION -  The blood pressure is high in the office.  I will increase her Cozaar to 50 mg twice a day.  DYSLIPIDEMIA -  This is followed closely by Horatio Pel, MD. I will defer to his management.

## 2014-01-06 NOTE — Patient Instructions (Signed)
Your physician recommends that you schedule a follow-up appointment in: 6 months with Dr. Percival Spanish  Have your blood work done in 2 weeks  Increase you losartan ( cozaar ) to 50 mg two times a day

## 2014-04-28 ENCOUNTER — Other Ambulatory Visit: Payer: Self-pay | Admitting: Cardiology

## 2014-04-28 MED ORDER — ISOSORBIDE MONONITRATE ER 60 MG PO TB24: 60.0000 mg | ORAL_TABLET | Freq: Every day | ORAL | Status: DC

## 2014-04-28 MED ORDER — AMLODIPINE BESYLATE 10 MG PO TABS: 10.0000 mg | ORAL_TABLET | Freq: Every day | ORAL | Status: DC

## 2014-07-11 ENCOUNTER — Other Ambulatory Visit: Payer: Self-pay | Admitting: *Deleted

## 2014-07-11 MED ORDER — METOPROLOL TARTRATE 50 MG PO TABS: 50.0000 mg | ORAL_TABLET | Freq: Two times a day (BID) | ORAL | Status: DC

## 2014-07-14 ENCOUNTER — Telehealth: Payer: Self-pay | Admitting: Cardiology

## 2014-07-14 NOTE — Telephone Encounter (Signed)
Received records from [facility] forwarded via fax to Dr. Minus Breeding 07/14/14 fbg.

## 2014-07-14 NOTE — Telephone Encounter (Signed)
Received records from [facility] for appointment on 07/17/14 with Dr Percival Spanish.  Records given to [facility] (medical records) for Dr Hochrein's schedule on 07/17/14. lp

## 2014-07-17 ENCOUNTER — Encounter: Payer: Self-pay | Admitting: Cardiology

## 2014-07-17 ENCOUNTER — Ambulatory Visit (INDEPENDENT_AMBULATORY_CARE_PROVIDER_SITE_OTHER): Payer: Medicare Other | Admitting: Cardiology

## 2014-07-17 VITALS — BP 180/90 | HR 64 | Ht 63.0 in | Wt 166.9 lb

## 2014-07-17 DIAGNOSIS — I2510 Atherosclerotic heart disease of native coronary artery without angina pectoris: Secondary | ICD-10-CM | POA: Diagnosis not present

## 2014-07-17 NOTE — Progress Notes (Signed)
   HPI The patient presents for followup of her CAD.  Since I last saw her she has done well.  The patient denies any new symptoms such as chest discomfort, neck or arm discomfort. There has been no new shortness of breath, PND or orthopnea. There have been no reported palpitations, presyncope or syncope. She is still working out with a Water engineer 3 x per week.  With this she denies any cardiovascular symptoms.  . Allergies  Allergen Reactions  . Lisinopril Rash    Current Outpatient Prescriptions  Medication Sig Dispense Refill  . amLODipine (NORVASC) 10 MG tablet Take 1 tablet (10 mg total) by mouth daily. 90 tablet 3  . aspirin 81 MG tablet Take 162 mg by mouth daily.     Marland Kitchen glipiZIDE (GLUCOTROL XL) 5 MG 24 hr tablet Take 5 mg by mouth daily.    . isosorbide mononitrate (IMDUR) 60 MG 24 hr tablet Take 1 tablet (60 mg total) by mouth daily. 30 tablet 3  . losartan (COZAAR) 50 MG tablet Take 50 mg by mouth daily.    . metoprolol (LOPRESSOR) 50 MG tablet Take 1 tablet (50 mg total) by mouth 2 (two) times daily. 60 tablet 0  . rosuvastatin (CRESTOR) 20 MG tablet Take 10 mg by mouth daily.     No current facility-administered medications for this visit.    Past Medical History  Diagnosis Date  . Shortness of breath   . HTN (hypertension)   . DM (diabetes mellitus)   . Pleural effusion   . CAD (coronary artery disease)     status post stenting of the RCA status post CABG. (left internal mammary artery to  LAD, saphenous vein graft to second diagonal, saphenous vein  graft to obtuse marginal 1, saphenous vein graft to posterior  descending).  . Cardiomyopathy      Mildly reduced EF (45%).     Past Surgical History  Procedure Laterality Date  . Bilateral cataract surgery    . Coronary artery bypass graft      ROS:  As stated in the HPI and negative for all other systems.  PHYSICAL EXAM BP 180/90 mmHg  Pulse 64  Ht 5\' 3"  (1.6 m)  Wt 166 lb 14.4 oz (75.705 kg)  BMI 29.57  kg/m2 GENERAL:  Well appearing NECK:  No jugular venous distention, waveform within normal limits, carotid upstroke brisk and symmetric, no bruits, no thyromegaly LUNGS:  Clear to auscultation bilaterally BACK:  No CVA tenderness CHEST:  Well healed sternotomy scar. HEART:  PMI not displaced or sustained,S1 and S2 within normal limits, no S3, no S4, no clicks, no rubs, no murmurs ABD:  Flat, positive bowel sounds normal in frequency in pitch, no bruits, no rebound, no guarding, no midline pulsatile mass, no hepatomegaly, no splenomegaly EXT:  2 plus pulses throughout, mild right greater than left lower leg edema, no cyanosis no clubbing   ASSESSMENT AND PLAN  CAD - The patient has no new sypmtoms. No further cardiovascular testing is indicated. We will continue with aggressive risk reduction and meds as listed.   HYPERTENSION -  The blood pressure is high in the office. However, she is watching it closely at home and it is never elevated.  No change in therapy is indicated.   DYSLIPIDEMIA -  This is followed closely by Horatio Pel, MD. I will defer to his management.

## 2014-07-17 NOTE — Patient Instructions (Signed)
Dr Percival Spanish recommends that you schedule a follow-up appointment in 6 months. You will receive a reminder letter in the mail two months in advance. If you don't receive a letter, please call our office to schedule the follow-up appointment.

## 2014-07-28 ENCOUNTER — Other Ambulatory Visit: Payer: Self-pay | Admitting: *Deleted

## 2014-07-28 MED ORDER — LOSARTAN POTASSIUM 50 MG PO TABS: 50.0000 mg | ORAL_TABLET | Freq: Every day | ORAL | Status: DC

## 2014-08-10 ENCOUNTER — Other Ambulatory Visit: Payer: Self-pay | Admitting: Cardiology

## 2014-08-11 ENCOUNTER — Other Ambulatory Visit: Payer: Self-pay

## 2014-08-11 MED ORDER — METOPROLOL TARTRATE 50 MG PO TABS: 50.0000 mg | ORAL_TABLET | Freq: Two times a day (BID) | ORAL | Status: DC

## 2014-08-11 MED ORDER — ISOSORBIDE MONONITRATE ER 60 MG PO TB24: 60.0000 mg | ORAL_TABLET | Freq: Every day | ORAL | Status: DC

## 2014-09-06 ENCOUNTER — Other Ambulatory Visit: Payer: Self-pay | Admitting: Cardiology

## 2014-12-20 ENCOUNTER — Other Ambulatory Visit: Payer: Self-pay | Admitting: Cardiology

## 2015-01-15 ENCOUNTER — Encounter: Payer: Self-pay | Admitting: Cardiology

## 2015-01-15 ENCOUNTER — Ambulatory Visit (INDEPENDENT_AMBULATORY_CARE_PROVIDER_SITE_OTHER): Payer: Medicare Other | Admitting: Cardiology

## 2015-01-15 VITALS — BP 162/80 | HR 67 | Ht 63.0 in | Wt 171.0 lb

## 2015-01-15 DIAGNOSIS — I2510 Atherosclerotic heart disease of native coronary artery without angina pectoris: Secondary | ICD-10-CM

## 2015-01-15 NOTE — Patient Instructions (Signed)
Your physician wants you to follow-up in: 6 Months. You will receive a reminder letter in the mail two months in advance. If you don't receive a letter, please call our office to schedule the follow-up appointment.  Merry Christmas and Happy New Year!!  

## 2015-01-15 NOTE — Progress Notes (Signed)
HPI The patient presents for followup of her CAD.  Since I last saw her she has done well.  The patient denies any new symptoms such as chest discomfort, neck or arm discomfort. There has been no new shortness of breath, PND or orthopnea. There have been no reported palpitations, presyncope or syncope. She is still working out with a Water engineer 3 x per week.  With this she denies any cardiovascular symptoms.  . Allergies  Allergen Reactions  . Lisinopril Rash    Current Outpatient Prescriptions  Medication Sig Dispense Refill  . amLODipine (NORVASC) 10 MG tablet Take 1 tablet (10 mg total) by mouth daily. 90 tablet 3  . aspirin 81 MG tablet Take 162 mg by mouth daily.     Marland Kitchen glipiZIDE (GLUCOTROL XL) 5 MG 24 hr tablet Take 5 mg by mouth daily.    . isosorbide mononitrate (IMDUR) 60 MG 24 hr tablet TAKE ONE TABLET BY MOUTH ONCE DAILY 30 tablet 5  . losartan (COZAAR) 50 MG tablet Take 1 tablet (50 mg total) by mouth daily. 30 tablet 5  . metoprolol (LOPRESSOR) 50 MG tablet TAKE ONE TABLET BY MOUTH TWICE DAILY. 60 tablet 3  . rosuvastatin (CRESTOR) 20 MG tablet Take 10 mg by mouth daily.     No current facility-administered medications for this visit.    Past Medical History  Diagnosis Date  . Shortness of breath   . HTN (hypertension)   . DM (diabetes mellitus)   . Pleural effusion   . CAD (coronary artery disease)     status post stenting of the RCA status post CABG. (left internal mammary artery to  LAD, saphenous vein graft to second diagonal, saphenous vein  graft to obtuse marginal 1, saphenous vein graft to posterior  descending).  . Cardiomyopathy      Mildly reduced EF (45%).     Past Surgical History  Procedure Laterality Date  . Bilateral cataract surgery    . Coronary artery bypass graft      ROS:  As stated in the HPI and negative for all other systems.  PHYSICAL EXAM BP 162/80 mmHg  Pulse 67  Ht 5\' 3"  (1.6 m)  Wt 171 lb (77.565 kg)  BMI 30.30  kg/m2 GENERAL:  Well appearing NECK:  No jugular venous distention, waveform within normal limits, carotid upstroke brisk and symmetric, no bruits, no thyromegaly LUNGS:  Clear to auscultation bilaterally BACK:  No CVA tenderness CHEST:  Well healed sternotomy scar. HEART:  PMI not displaced or sustained,S1 and S2 within normal limits, no S3, no S4, no clicks, no rubs, no murmurs ABD:  Flat, positive bowel sounds normal in frequency in pitch, no bruits, no rebound, no guarding, no midline pulsatile mass, no hepatomegaly, no splenomegaly EXT:  2 plus pulses throughout, mild right greater than left lower leg edema, no cyanosis no clubbing  EKG:   Sinus rhythm, rate 67, probable old inferior infarct , axis within normal limits, intervals within normal limits, lateral T-wave inversions unchanged from previous. 01/15/2015   ASSESSMENT AND PLAN  CAD - The patient has no new sypmtoms. No further cardiovascular testing is indicated. We will continue with aggressive risk reduction and meds as listed.   HYPERTENSION -  The blood pressure continues to be high. I have instructed the patient to record a blood pressure diary and recording this. This will be presented for my review and pending these results I will make further suggestions about changes in therapy for optimal blood  pressure control.  DYSLIPIDEMIA -  This is followed closely by Horatio Pel, MD. I will defer to his management.

## 2015-01-16 ENCOUNTER — Ambulatory Visit: Payer: Medicare Other | Admitting: Cardiology

## 2015-01-20 ENCOUNTER — Other Ambulatory Visit: Payer: Self-pay | Admitting: Cardiology

## 2015-01-20 NOTE — Telephone Encounter (Signed)
New message     *STAT* If patient is at the pharmacy, call can be transferred to refill team.   1. Which medications need to be refilled? (please list name of each medication and dose if known) metoprolol  50 mg / losartan  50 mg   2. Which pharmacy/location (including street and city if local pharmacy) is medication to be sent to? [facility] on [address]   3. Do they need a 30 day or 90 day supply? 30 days

## 2015-04-19 ENCOUNTER — Other Ambulatory Visit: Payer: Self-pay | Admitting: Cardiology

## 2015-04-20 NOTE — Telephone Encounter (Signed)
Rx request sent to pharmacy.  

## 2015-05-10 ENCOUNTER — Other Ambulatory Visit: Payer: Self-pay | Admitting: Cardiology

## 2015-05-11 NOTE — Telephone Encounter (Signed)
REFILL 

## 2015-06-27 ENCOUNTER — Other Ambulatory Visit: Payer: Self-pay | Admitting: Cardiology

## 2015-06-30 NOTE — Telephone Encounter (Signed)
Rx(s) sent to pharmacy electronically.  

## 2015-07-13 ENCOUNTER — Other Ambulatory Visit: Payer: Self-pay | Admitting: Cardiology

## 2015-07-13 NOTE — Telephone Encounter (Signed)
Rx request sent to pharmacy.  

## 2015-07-27 ENCOUNTER — Other Ambulatory Visit: Payer: Self-pay | Admitting: Cardiology

## 2015-07-27 ENCOUNTER — Other Ambulatory Visit: Payer: Self-pay

## 2015-07-27 NOTE — Telephone Encounter (Signed)
Rx request sent to pharmacy.  

## 2015-08-07 ENCOUNTER — Encounter: Payer: Self-pay | Admitting: Cardiology

## 2015-08-22 ENCOUNTER — Other Ambulatory Visit: Payer: Self-pay | Admitting: Cardiology

## 2015-08-28 ENCOUNTER — Ambulatory Visit: Payer: Medicare Other | Admitting: Cardiology

## 2015-09-20 ENCOUNTER — Other Ambulatory Visit: Payer: Self-pay | Admitting: Cardiology

## 2015-09-21 NOTE — Telephone Encounter (Signed)
Rx request sent to pharmacy.  

## 2015-10-12 ENCOUNTER — Ambulatory Visit: Payer: Medicare Other | Admitting: Cardiology

## 2015-10-18 ENCOUNTER — Other Ambulatory Visit: Payer: Self-pay | Admitting: Cardiology

## 2015-11-21 NOTE — Progress Notes (Signed)
HPI The patient presents for followup of her CAD.  Since I last saw her she has done well.  She denies any cardiovascular symptoms. She might get a little short of breath if she walks long distance but she thinks this is baseline. The patient denies any new symptoms such as chest discomfort, neck or arm discomfort. There has been no new shortness of breath, PND or orthopnea. There have been no reported palpitations, presyncope or syncope.  She still doing her exercises with a Physiological scientist.   Allergies  Allergen Reactions  . Lisinopril Rash    Current Outpatient Prescriptions  Medication Sig Dispense Refill  . amLODipine (NORVASC) 10 MG tablet Take 1 tablet (10 mg total) by mouth daily. 90 tablet 3  . aspirin 81 MG tablet Take 162 mg by mouth daily.     Marland Kitchen glipiZIDE (GLUCOTROL XL) 5 MG 24 hr tablet Take 5 mg by mouth daily.    . isosorbide mononitrate (IMDUR) 60 MG 24 hr tablet Take 1 tablet (60 mg total) by mouth daily. 30 tablet 11  . losartan (COZAAR) 50 MG tablet Take 1 tablet (50 mg total) by mouth 2 (two) times daily. 60 tablet 9  . metoprolol (LOPRESSOR) 50 MG tablet TAKE ONE TABLET BY MOUTH TWICE DAILY 60 tablet 1  . rosuvastatin (CRESTOR) 20 MG tablet Take 10 mg by mouth daily.     No current facility-administered medications for this visit.     Past Medical History:  Diagnosis Date  . CAD (coronary artery disease)    status post stenting of the RCA status post CABG. (left internal mammary artery to  LAD, saphenous vein graft to second diagonal, saphenous vein  graft to obtuse marginal 1, saphenous vein graft to posterior  descending).  . Cardiomyopathy     Mildly reduced EF (45%).   . DM (diabetes mellitus) ([place])   . HTN (hypertension)   . Pleural effusion   . Shortness of breath     Past Surgical History:  Procedure Laterality Date  . bilateral cataract surgery    . CORONARY ARTERY BYPASS GRAFT      ROS:   As stated in the HPI and negative for all other  systems.  PHYSICAL EXAM BP (!) 180/74 (BP Location: Right Arm, Patient Position: Sitting, Cuff Size: Normal)   Pulse 65   Ht 5\' 3"  (1.6 m)   Wt 166 lb 9.6 oz (75.6 kg)   BMI 29.51 kg/m  GENERAL:  Well appearing NECK:  No jugular venous distention, waveform within normal limits, carotid upstroke brisk and symmetric, no bruits, no thyromegaly LUNGS:  Clear to auscultation bilaterally BACK:  No CVA tenderness CHEST:  Well healed sternotomy scar. 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 11/23/15   ASSESSMENT AND PLAN  CAD - The patient has no new sypmtoms. No further cardiovascular testing is indicated. We will continue with aggressive risk reduction and meds as listed.   HYPERTENSION -  The blood pressure continues  to be high. She takes it at home and runs high oftentimes the 950H or even systolics in the 225J by her report. I will increase the Cozaar 50 mg twice daily. She'll get a basic metabolic profile in one month.  DYSLIPIDEMIA -  This is followed closely by Horatio Pel, MD. I will defer to his management.

## 2015-11-23 ENCOUNTER — Other Ambulatory Visit: Payer: Self-pay | Admitting: Cardiology

## 2015-11-23 ENCOUNTER — Encounter: Payer: Self-pay | Admitting: Cardiology

## 2015-11-23 ENCOUNTER — Ambulatory Visit (INDEPENDENT_AMBULATORY_CARE_PROVIDER_SITE_OTHER): Payer: Medicare Other | Admitting: Cardiology

## 2015-11-23 VITALS — BP 180/74 | HR 65 | Ht 63.0 in | Wt 166.6 lb

## 2015-11-23 DIAGNOSIS — I2510 Atherosclerotic heart disease of native coronary artery without angina pectoris: Secondary | ICD-10-CM | POA: Diagnosis not present

## 2015-11-23 DIAGNOSIS — I10 Essential (primary) hypertension: Secondary | ICD-10-CM | POA: Diagnosis not present

## 2015-11-23 MED ORDER — LOSARTAN POTASSIUM 50 MG PO TABS: 50.0000 mg | ORAL_TABLET | Freq: Two times a day (BID) | ORAL | 9 refills | Status: DC

## 2015-11-23 MED ORDER — ISOSORBIDE MONONITRATE ER 60 MG PO TB24: 60.0000 mg | ORAL_TABLET | Freq: Every day | ORAL | 11 refills | Status: DC

## 2015-11-23 MED ORDER — AMLODIPINE BESYLATE 10 MG PO TABS: 10.0000 mg | ORAL_TABLET | Freq: Every day | ORAL | 3 refills | Status: DC

## 2015-11-23 NOTE — Patient Instructions (Signed)
Medication Instructions:  INCREASE- Losartan 50 mg twice a day  Labwork: BMP in 1 Month  Testing/Procedures: None Ordered  Follow-Up: Your physician wants you to follow-up in: 1 Year. You will receive a reminder letter in the mail two months in advance. If you don't receive a letter, please call our office to schedule the follow-up appointment.   Any Other Special Instructions Will Be Listed Below (If Applicable).     If you need a refill on your cardiac medications before your next appointment, please call your pharmacy.

## 2016-02-20 ENCOUNTER — Other Ambulatory Visit: Payer: Self-pay | Admitting: Cardiology

## 2016-06-28 ENCOUNTER — Telehealth: Payer: Self-pay | Admitting: Cardiology

## 2016-06-28 NOTE — Telephone Encounter (Signed)
Received records from [facility] for appointment on 07/15/16 with Dr Percival Spanish.  Records put with Dr Hochrein's schedule on 07/15/16. lp

## 2016-06-30 ENCOUNTER — Other Ambulatory Visit: Payer: Self-pay

## 2016-07-01 ENCOUNTER — Telehealth: Payer: Self-pay | Admitting: *Deleted

## 2016-07-01 NOTE — Telephone Encounter (Signed)
Received faxed medication refill paper from pt [facility] pharmacy for clarification, as per pt pharmacy she stated she is taking Imdur 60 mg twice a day, I call pt but no answer and unable to leave message on pt voicemail, I call pt [facility] pharmacy letting them know I cannot find where that medications was change to twice a day only medication that was increase was pt Losartan and to have pt contact our office.

## 2016-07-05 ENCOUNTER — Other Ambulatory Visit: Payer: Self-pay

## 2016-07-08 ENCOUNTER — Telehealth: Payer: Self-pay

## 2016-07-08 NOTE — Telephone Encounter (Addendum)
Received medication request from patients pharmacy, [facility], seeking medication refill and clarification on Imdur 60mg  ER. There was a note on the med request stating patient has been taking Imdur bid and that her son is a physician and heard Dr Percival Spanish tell the patient to increase med to bid. There is no record in the chart that confirms this statement. Left a message for patient to contact office at her earliest convenience.

## 2016-07-12 NOTE — Telephone Encounter (Signed)
Spoke with pt about her medication, letting her know that her Isosorbide was not increase to twice a day, it was her Losartan that was increase to 50 mg twice a day, pt stated that she have been taking this medication( Isosorbide) twice daily and her losartan only once a day. Pt stated she have an appt with Dr Percival Spanish on Friday advised pt to keep that appt and we will go over all her medication with her and how it should be taken.

## 2016-07-14 NOTE — Progress Notes (Signed)
HPI The patient presents for followup of her CAD.  Since I last saw her she has had no acute cardiac complaints. The patient denies any new symptoms such as chest discomfort, neck or arm discomfort. There has been no new shortness of breath, PND or orthopnea. There have been no reported palpitations, presyncope or syncope.   She had some confusion about her meds recently as she was taking her Imdur twice daily and her Cozaar once daily.  She is not taking it as I wanted.   She has had an increased creatinine and is going to see Dr. Florene Glen.    Allergies  Allergen Reactions  . Lisinopril Rash    Current Outpatient Prescriptions  Medication Sig Dispense Refill  . amLODipine (NORVASC) 10 MG tablet Take 1 tablet (10 mg total) by mouth daily. 90 tablet 3  . aspirin 81 MG tablet Take 162 mg by mouth daily.     Marland Kitchen glipiZIDE (GLUCOTROL XL) 5 MG 24 hr tablet Take 5 mg by mouth daily.    . isosorbide mononitrate (IMDUR) 60 MG 24 hr tablet Take 1 tablet (60 mg total) by mouth daily. 30 tablet 11  . losartan (COZAAR) 50 MG tablet Take 1 tablet (50 mg total) by mouth 2 (two) times daily. 60 tablet 9  . metoprolol (LOPRESSOR) 50 MG tablet TAKE ONE TABLET BY MOUTH TWICE DAILY 180 tablet 2  . rosuvastatin (CRESTOR) 20 MG tablet Take 10 mg by mouth daily.     No current facility-administered medications for this visit.     Past Medical History:  Diagnosis Date  . CAD (coronary artery disease)    status post stenting of the RCA status post CABG. (left internal mammary artery to  LAD, saphenous vein graft to second diagonal, saphenous vein  graft to obtuse marginal 1, saphenous vein graft to posterior  descending).  . Cardiomyopathy     Mildly reduced EF (45%).   . DM (diabetes mellitus) ([place])   . HTN (hypertension)   . Pleural effusion   . Shortness of breath     Past Surgical History:  Procedure Laterality Date  . bilateral cataract surgery    . CORONARY ARTERY BYPASS GRAFT      ROS:   As  stated in the HPI and negative for all other systems.  PHYSICAL EXAM BP (!) 176/70   Pulse 74   Ht 5\' 3"  (1.6 m)   Wt 166 lb (75.3 kg)   BMI 29.41 kg/m   GENERAL:  Well appearing NECK:  No jugular venous distention, waveform within normal limits, carotid upstroke brisk and symmetric, no bruits, no thyromegaly LUNGS:  Clear to auscultation bilaterally BACK:  No CVA tenderness CHEST:  Well healed sternotomy scar. HEART:  PMI not displaced or sustained,S1 and S2 within normal limits, no S3, no S4, no clicks, no rubs, no murmurs ABD:  Flat, positive bowel sounds normal in frequency in pitch, no bruits, no rebound, no guarding, no midline pulsatile mass, no hepatomegaly, no splenomegaly EXT:  2 plus pulses throughout, mild bilateral leg edema, no cyanosis no clubbing    ASSESSMENT AND PLAN  CAD - The patient has no new sypmtoms.  No further cardiovascular testing is indicated.  We will continue with aggressive risk reduction and meds as listed.  HYPERTENSION -  The blood pressure is at target.  She will continue the meds as listed.   DYSLIPIDEMIA -  This is followed closely by Deland Pretty, MD.   I will defer to  his management.

## 2016-07-14 NOTE — Telephone Encounter (Signed)
Pt have appt 06/15 will reviewed medication

## 2016-07-15 ENCOUNTER — Encounter: Payer: Self-pay | Admitting: Cardiology

## 2016-07-15 ENCOUNTER — Ambulatory Visit (INDEPENDENT_AMBULATORY_CARE_PROVIDER_SITE_OTHER): Payer: Medicare Other | Admitting: Cardiology

## 2016-07-15 VITALS — BP 176/70 | HR 74 | Ht 63.0 in | Wt 166.0 lb

## 2016-07-15 DIAGNOSIS — E785 Hyperlipidemia, unspecified: Secondary | ICD-10-CM | POA: Diagnosis not present

## 2016-07-15 DIAGNOSIS — I10 Essential (primary) hypertension: Secondary | ICD-10-CM

## 2016-07-15 DIAGNOSIS — I2510 Atherosclerotic heart disease of native coronary artery without angina pectoris: Secondary | ICD-10-CM

## 2016-07-15 NOTE — Patient Instructions (Signed)

## 2016-07-31 DIAGNOSIS — N179 Acute kidney failure, unspecified: Secondary | ICD-10-CM

## 2016-07-31 HISTORY — DX: Acute kidney failure, unspecified: N17.9

## 2016-09-08 ENCOUNTER — Other Ambulatory Visit: Payer: Self-pay | Admitting: Cardiology

## 2016-09-19 DIAGNOSIS — I639 Cerebral infarction, unspecified: Secondary | ICD-10-CM

## 2016-09-19 HISTORY — DX: Cerebral infarction, unspecified: I63.9

## 2016-09-20 ENCOUNTER — Inpatient Hospital Stay (HOSPITAL_COMMUNITY): Payer: Medicare Other

## 2016-09-20 ENCOUNTER — Other Ambulatory Visit (HOSPITAL_COMMUNITY): Payer: Medicare Other

## 2016-09-20 ENCOUNTER — Emergency Department (HOSPITAL_COMMUNITY): Payer: Medicare Other

## 2016-09-20 ENCOUNTER — Encounter (HOSPITAL_COMMUNITY): Payer: Self-pay

## 2016-09-20 ENCOUNTER — Inpatient Hospital Stay (HOSPITAL_COMMUNITY)
Admission: EM | Admit: 2016-09-20 | Discharge: 2016-09-22 | DRG: 065 | Disposition: A | Discharge status: Rehabilitation Facility | Payer: Medicare Other | Attending: Family Medicine | Admitting: Family Medicine

## 2016-09-20 DIAGNOSIS — E118 Type 2 diabetes mellitus with unspecified complications: Secondary | ICD-10-CM | POA: Diagnosis not present

## 2016-09-20 DIAGNOSIS — I5030 Unspecified diastolic (congestive) heart failure: Secondary | ICD-10-CM | POA: Diagnosis present

## 2016-09-20 DIAGNOSIS — Z7982 Long term (current) use of aspirin: Secondary | ICD-10-CM | POA: Diagnosis not present

## 2016-09-20 DIAGNOSIS — Z888 Allergy status to other drugs, medicaments and biological substances status: Secondary | ICD-10-CM

## 2016-09-20 DIAGNOSIS — I130 Hypertensive heart and chronic kidney disease with heart failure and stage 1 through stage 4 chronic kidney disease, or unspecified chronic kidney disease: Secondary | ICD-10-CM | POA: Diagnosis present

## 2016-09-20 DIAGNOSIS — I429 Cardiomyopathy, unspecified: Secondary | ICD-10-CM | POA: Diagnosis present

## 2016-09-20 DIAGNOSIS — E113299 Type 2 diabetes mellitus with mild nonproliferative diabetic retinopathy without macular edema, unspecified eye: Secondary | ICD-10-CM | POA: Diagnosis present

## 2016-09-20 DIAGNOSIS — Z955 Presence of coronary angioplasty implant and graft: Secondary | ICD-10-CM

## 2016-09-20 DIAGNOSIS — R269 Unspecified abnormalities of gait and mobility: Secondary | ICD-10-CM | POA: Diagnosis not present

## 2016-09-20 DIAGNOSIS — E1142 Type 2 diabetes mellitus with diabetic polyneuropathy: Secondary | ICD-10-CM | POA: Diagnosis not present

## 2016-09-20 DIAGNOSIS — G8194 Hemiplegia, unspecified affecting left nondominant side: Secondary | ICD-10-CM | POA: Diagnosis present

## 2016-09-20 DIAGNOSIS — I252 Old myocardial infarction: Secondary | ICD-10-CM | POA: Diagnosis not present

## 2016-09-20 DIAGNOSIS — I639 Cerebral infarction, unspecified: Secondary | ICD-10-CM | POA: Diagnosis present

## 2016-09-20 DIAGNOSIS — E669 Obesity, unspecified: Secondary | ICD-10-CM | POA: Diagnosis present

## 2016-09-20 DIAGNOSIS — E1122 Type 2 diabetes mellitus with diabetic chronic kidney disease: Secondary | ICD-10-CM | POA: Diagnosis present

## 2016-09-20 DIAGNOSIS — I10 Essential (primary) hypertension: Secondary | ICD-10-CM | POA: Diagnosis not present

## 2016-09-20 DIAGNOSIS — D638 Anemia in other chronic diseases classified elsewhere: Secondary | ICD-10-CM | POA: Diagnosis present

## 2016-09-20 DIAGNOSIS — N184 Chronic kidney disease, stage 4 (severe): Secondary | ICD-10-CM | POA: Diagnosis present

## 2016-09-20 DIAGNOSIS — H40009 Preglaucoma, unspecified, unspecified eye: Secondary | ICD-10-CM | POA: Diagnosis present

## 2016-09-20 DIAGNOSIS — M7989 Other specified soft tissue disorders: Secondary | ICD-10-CM | POA: Diagnosis not present

## 2016-09-20 DIAGNOSIS — Z9842 Cataract extraction status, left eye: Secondary | ICD-10-CM

## 2016-09-20 DIAGNOSIS — E1151 Type 2 diabetes mellitus with diabetic peripheral angiopathy without gangrene: Secondary | ICD-10-CM | POA: Diagnosis present

## 2016-09-20 DIAGNOSIS — N183 Chronic kidney disease, stage 3 (moderate): Secondary | ICD-10-CM | POA: Diagnosis not present

## 2016-09-20 DIAGNOSIS — E785 Hyperlipidemia, unspecified: Secondary | ICD-10-CM

## 2016-09-20 DIAGNOSIS — Z6829 Body mass index (BMI) 29.0-29.9, adult: Secondary | ICD-10-CM

## 2016-09-20 DIAGNOSIS — Z951 Presence of aortocoronary bypass graft: Secondary | ICD-10-CM

## 2016-09-20 DIAGNOSIS — I69354 Hemiplegia and hemiparesis following cerebral infarction affecting left non-dominant side: Secondary | ICD-10-CM | POA: Diagnosis not present

## 2016-09-20 DIAGNOSIS — N179 Acute kidney failure, unspecified: Secondary | ICD-10-CM | POA: Diagnosis not present

## 2016-09-20 DIAGNOSIS — Z79899 Other long term (current) drug therapy: Secondary | ICD-10-CM

## 2016-09-20 DIAGNOSIS — Z8673 Personal history of transient ischemic attack (TIA), and cerebral infarction without residual deficits: Secondary | ICD-10-CM | POA: Diagnosis not present

## 2016-09-20 DIAGNOSIS — Z9841 Cataract extraction status, right eye: Secondary | ICD-10-CM

## 2016-09-20 DIAGNOSIS — Z961 Presence of intraocular lens: Secondary | ICD-10-CM | POA: Diagnosis present

## 2016-09-20 DIAGNOSIS — I2510 Atherosclerotic heart disease of native coronary artery without angina pectoris: Secondary | ICD-10-CM | POA: Diagnosis present

## 2016-09-20 DIAGNOSIS — I129 Hypertensive chronic kidney disease with stage 1 through stage 4 chronic kidney disease, or unspecified chronic kidney disease: Secondary | ICD-10-CM | POA: Diagnosis not present

## 2016-09-20 DIAGNOSIS — R0989 Other specified symptoms and signs involving the circulatory and respiratory systems: Secondary | ICD-10-CM | POA: Diagnosis not present

## 2016-09-20 DIAGNOSIS — M79609 Pain in unspecified limb: Secondary | ICD-10-CM | POA: Diagnosis not present

## 2016-09-20 DIAGNOSIS — R471 Dysarthria and anarthria: Secondary | ICD-10-CM | POA: Diagnosis present

## 2016-09-20 DIAGNOSIS — D62 Acute posthemorrhagic anemia: Secondary | ICD-10-CM | POA: Diagnosis not present

## 2016-09-20 DIAGNOSIS — R29707 NIHSS score 7: Secondary | ICD-10-CM | POA: Diagnosis present

## 2016-09-20 DIAGNOSIS — E1159 Type 2 diabetes mellitus with other circulatory complications: Secondary | ICD-10-CM | POA: Diagnosis not present

## 2016-09-20 DIAGNOSIS — I999 Unspecified disorder of circulatory system: Secondary | ICD-10-CM | POA: Diagnosis not present

## 2016-09-20 DIAGNOSIS — E119 Type 2 diabetes mellitus without complications: Secondary | ICD-10-CM

## 2016-09-20 DIAGNOSIS — I69398 Other sequelae of cerebral infarction: Secondary | ICD-10-CM | POA: Diagnosis not present

## 2016-09-20 DIAGNOSIS — I361 Nonrheumatic tricuspid (valve) insufficiency: Secondary | ICD-10-CM | POA: Diagnosis not present

## 2016-09-20 DIAGNOSIS — I340 Nonrheumatic mitral (valve) insufficiency: Secondary | ICD-10-CM | POA: Diagnosis not present

## 2016-09-20 HISTORY — DX: Cerebral infarction, unspecified: I63.9

## 2016-09-20 HISTORY — DX: Chronic kidney disease, stage 3 unspecified: N18.30

## 2016-09-20 HISTORY — DX: Chronic kidney disease, stage 3 (moderate): N18.3

## 2016-09-20 HISTORY — DX: Acute kidney failure, unspecified: N17.9

## 2016-09-20 HISTORY — DX: Type 2 diabetes mellitus without complications: E11.9

## 2016-09-20 HISTORY — DX: Angina pectoris, unspecified: I20.9

## 2016-09-20 HISTORY — DX: Personal history of other medical treatment: Z92.89

## 2016-09-20 LAB — DIFFERENTIAL
BASOS PCT: 0 %
Basophils Absolute: 0 10*3/uL (ref 0.0–0.1)
EOS ABS: 0.1 10*3/uL (ref 0.0–0.7)
Eosinophils Relative: 1 %
LYMPHS ABS: 1.2 10*3/uL (ref 0.7–4.0)
LYMPHS PCT: 17 %
Monocytes Absolute: 0.6 10*3/uL (ref 0.1–1.0)
Monocytes Relative: 9 %
NEUTROS ABS: 5.1 10*3/uL (ref 1.7–7.7)
NEUTROS PCT: 73 %

## 2016-09-20 LAB — CBC
HCT: 32 % — ABNORMAL LOW (ref 36.0–46.0)
HEMOGLOBIN: 10.4 g/dL — AB (ref 12.0–15.0)
MCH: 28.8 pg (ref 26.0–34.0)
MCHC: 32.5 g/dL (ref 30.0–36.0)
MCV: 88.6 fL (ref 78.0–100.0)
PLATELETS: 226 10*3/uL (ref 150–400)
RBC: 3.61 MIL/uL — AB (ref 3.87–5.11)
RDW: 13.8 % (ref 11.5–15.5)
WBC: 7.1 10*3/uL (ref 4.0–10.5)

## 2016-09-20 LAB — COMPREHENSIVE METABOLIC PANEL
ALT: 14 U/L (ref 14–54)
AST: 17 U/L (ref 15–41)
Albumin: 3.5 g/dL (ref 3.5–5.0)
Alkaline Phosphatase: 63 U/L (ref 38–126)
Anion gap: 8 (ref 5–15)
BUN: 33 mg/dL — ABNORMAL HIGH (ref 6–20)
CO2: 22 mmol/L (ref 22–32)
Calcium: 10 mg/dL (ref 8.9–10.3)
Chloride: 109 mmol/L (ref 101–111)
Creatinine, Ser: 2.04 mg/dL — ABNORMAL HIGH (ref 0.44–1.00)
GFR calc Af Amer: 24 mL/min — ABNORMAL LOW (ref >60)
GFR calc non Af Amer: 21 mL/min — ABNORMAL LOW (ref >60)
Glucose, Bld: 205 mg/dL — ABNORMAL HIGH (ref 65–99)
Potassium: 4.2 mmol/L (ref 3.5–5.1)
Sodium: 139 mmol/L (ref 135–145)
Total Bilirubin: 0.8 mg/dL (ref 0.3–1.2)
Total Protein: 6.9 g/dL (ref 6.5–8.1)

## 2016-09-20 LAB — URINALYSIS, ROUTINE W REFLEX MICROSCOPIC
Bacteria, UA: NONE SEEN
Bilirubin Urine: NEGATIVE
Glucose, UA: 50 mg/dL — AB
Ketones, ur: NEGATIVE mg/dL
Leukocytes, UA: NEGATIVE
Nitrite: NEGATIVE
PROTEIN: 100 mg/dL — AB
Specific Gravity, Urine: 1.01 (ref 1.005–1.030)
pH: 6 (ref 5.0–8.0)

## 2016-09-20 LAB — I-STAT CHEM 8, ED
BUN: 35 mg/dL — AB (ref 6–20)
CALCIUM ION: 1.23 mmol/L (ref 1.15–1.40)
CHLORIDE: 109 mmol/L (ref 101–111)
CREATININE: 2 mg/dL — AB (ref 0.44–1.00)
Glucose, Bld: 204 mg/dL — ABNORMAL HIGH (ref 65–99)
HCT: 32 % — ABNORMAL LOW (ref 36.0–46.0)
Hemoglobin: 10.9 g/dL — ABNORMAL LOW (ref 12.0–15.0)
Potassium: 4.2 mmol/L (ref 3.5–5.1)
Sodium: 141 mmol/L (ref 135–145)
TCO2: 23 mmol/L (ref 0–100)

## 2016-09-20 LAB — APTT
aPTT: 29 seconds (ref 24–36)
aPTT: 30 seconds (ref 24–36)

## 2016-09-20 LAB — I-STAT TROPONIN, ED: TROPONIN I, POC: 0 ng/mL (ref 0.00–0.08)

## 2016-09-20 LAB — GLUCOSE, CAPILLARY
GLUCOSE-CAPILLARY: 210 mg/dL — AB (ref 65–99)
Glucose-Capillary: 210 mg/dL — ABNORMAL HIGH (ref 65–99)

## 2016-09-20 LAB — RAPID URINE DRUG SCREEN, HOSP PERFORMED
Amphetamines: NOT DETECTED
Barbiturates: NOT DETECTED
Benzodiazepines: NOT DETECTED
Cocaine: NOT DETECTED
Opiates: NOT DETECTED
Tetrahydrocannabinol: NOT DETECTED

## 2016-09-20 LAB — HEMOGLOBIN A1C
Hgb A1c MFr Bld: 7.7 % — ABNORMAL HIGH (ref 4.8–5.6)
Mean Plasma Glucose: 174.29 mg/dL

## 2016-09-20 LAB — PROTIME-INR
INR: 1.01
Prothrombin Time: 13.3 seconds (ref 11.4–15.2)

## 2016-09-20 LAB — ETHANOL: Alcohol, Ethyl (B): <5 mg/dL (ref <5)

## 2016-09-20 MED ORDER — INSULIN ASPART 100 UNIT/ML ~~LOC~~ SOLN
0.0000 [IU] | Freq: Three times a day (TID) | SUBCUTANEOUS | Status: DC
  Administered 2016-09-21: 1 [IU] via SUBCUTANEOUS
  Administered 2016-09-21 – 2016-09-22 (×2): 3 [IU] via SUBCUTANEOUS
  Administered 2016-09-22: 1 [IU] via SUBCUTANEOUS
  Administered 2016-09-22: 2 [IU] via SUBCUTANEOUS

## 2016-09-20 MED ORDER — ASPIRIN 81 MG PO CHEW
81.0000 mg | CHEWABLE_TABLET | Freq: Every day | ORAL | Status: DC
  Administered 2016-09-21: 81 mg via ORAL
  Filled 2016-09-20 (×2): qty 1

## 2016-09-20 MED ORDER — CHOLECALCIFEROL 10 MCG (400 UNIT) PO TABS
400.0000 [IU] | ORAL_TABLET | Freq: Every day | ORAL | Status: DC
  Administered 2016-09-21 – 2016-09-22 (×2): 400 [IU] via ORAL
  Filled 2016-09-20 (×2): qty 1

## 2016-09-20 MED ORDER — SENNOSIDES-DOCUSATE SODIUM 8.6-50 MG PO TABS: 1.0000 | ORAL_TABLET | Freq: Every evening | ORAL | Status: DC | PRN

## 2016-09-20 MED ORDER — SODIUM CHLORIDE 0.9 % IV SOLN: INTRAVENOUS | Status: DC

## 2016-09-20 MED ORDER — SODIUM CHLORIDE 0.9 % IV SOLN
INTRAVENOUS | Status: DC
  Administered 2016-09-20 – 2016-09-22 (×4): via INTRAVENOUS

## 2016-09-20 MED ORDER — AMLODIPINE BESYLATE 5 MG PO TABS
10.0000 mg | ORAL_TABLET | Freq: Every day | ORAL | Status: DC
  Administered 2016-09-21 – 2016-09-22 (×2): 10 mg via ORAL
  Filled 2016-09-20 (×2): qty 2

## 2016-09-20 MED ORDER — ACETAMINOPHEN 160 MG/5ML PO SOLN: 650.0000 mg | ORAL | Status: DC | PRN

## 2016-09-20 MED ORDER — METOPROLOL TARTRATE 25 MG PO TABS
50.0000 mg | ORAL_TABLET | Freq: Every day | ORAL | Status: DC
  Administered 2016-09-21 – 2016-09-22 (×2): 50 mg via ORAL
  Filled 2016-09-20 (×2): qty 2

## 2016-09-20 MED ORDER — ISOSORBIDE MONONITRATE ER 30 MG PO TB24
60.0000 mg | ORAL_TABLET | Freq: Every day | ORAL | Status: DC
  Administered 2016-09-21 – 2016-09-22 (×2): 60 mg via ORAL
  Filled 2016-09-20 (×2): qty 2

## 2016-09-20 MED ORDER — ACETAMINOPHEN 325 MG PO TABS: 650.0000 mg | ORAL_TABLET | ORAL | Status: DC | PRN

## 2016-09-20 MED ORDER — HEPARIN SODIUM (PORCINE) 5000 UNIT/ML IJ SOLN
5000.0000 [IU] | Freq: Three times a day (TID) | INTRAMUSCULAR | Status: DC
  Administered 2016-09-20 – 2016-09-22 (×6): 5000 [IU] via SUBCUTANEOUS
  Filled 2016-09-20 (×6): qty 1

## 2016-09-20 MED ORDER — ACETAMINOPHEN 650 MG RE SUPP: 650.0000 mg | RECTAL | Status: DC | PRN

## 2016-09-20 MED ORDER — LOSARTAN POTASSIUM 50 MG PO TABS
50.0000 mg | ORAL_TABLET | Freq: Two times a day (BID) | ORAL | Status: DC
  Administered 2016-09-21 – 2016-09-22 (×3): 50 mg via ORAL
  Filled 2016-09-20 (×3): qty 1

## 2016-09-20 MED ORDER — STROKE: EARLY STAGES OF RECOVERY BOOK
Freq: Once | Status: DC
  Filled 2016-09-20: qty 1

## 2016-09-20 MED ORDER — ASPIRIN 81 MG PO CHEW
324.0000 mg | CHEWABLE_TABLET | Freq: Once | ORAL | Status: AC
  Administered 2016-09-20: 324 mg via ORAL
  Filled 2016-09-20: qty 4

## 2016-09-20 MED ORDER — HYDRALAZINE HCL 20 MG/ML IJ SOLN
5.0000 mg | Freq: Three times a day (TID) | INTRAMUSCULAR | Status: DC | PRN
Start: 2016-09-20 — End: 2016-09-22

## 2016-09-20 NOTE — ED Notes (Signed)
Patient transported to CT 

## 2016-09-20 NOTE — ED Triage Notes (Signed)
Pt from home with left sided weakness. Last known well at 1900 Monday. Pt has no complaints other wise

## 2016-09-20 NOTE — Progress Notes (Signed)
NT took patients CBG at 8pm; she thought patient was q4 CBG but patient is AC/HS.  CBG not accurate patient just finished eating dinner tray.

## 2016-09-20 NOTE — ED Notes (Signed)
Patient reports gown and linens are wet. Found that IV had become disconnected. Gown and linens replaced. IV flushed, infusion continues. Patient to go upstairs.

## 2016-09-20 NOTE — ED Notes (Signed)
Attempted report x1. 

## 2016-09-20 NOTE — H&P (Signed)
History and Physical    [name] STM:[id number] DOB: [birth year] DOA: 09/20/2016   PCP: Deland Pretty, MD   Patient coming from:  Home    Chief Complaint: Left sided weakness  HPI: [name] is a 83 y.o. female with medical history significant for CAD, diabetes, hypertension, hyperlipidemia, presenting to the emergency department with left arm and left leg weakness, initially noted last evening around 7 PM while watching TV. She also noted left side decreased sensation. She did not seek medical attention immediately. Although she takes as being a day, she did not take an aspirin today. Patient  never had a similar episode. Denies any history of TIA. Denies vertigo dizziness or vision changes. Denies headaches, she does report some dysarthria, denies dysphagia. No confusion or seizures. Denies any chest pain, or shortness of breath. Denies any fever or chills, or night sweats. No tobacco. No new meds or hormonal supplements.  Denies any recent long distance trips or recent surgeries. No sick contacts.  Patient is compliant with his medications. No family history of stroke Patient was not administered TPA as is beyond time window for treatment consideration. Will admit for further evaluation and treatment. Her main contact is her son who is a Psychologist, sport and exercise outside this local area.  ED Course:  BP (!) 163/78   Pulse 72   Temp 97.6 F (36.4 C)   Resp 20   SpO2 97%    sodium 141 potassium 4.2 glucose 204 creatinine to troponin 0 hemoglobin 10.9 CT of the head negative for acute intracranial abnormality  Review of Systems:  As per HPI otherwise all other systems reviewed and are negative  Past Medical History:  Diagnosis Date  . CAD (coronary artery disease)    status post stenting of the RCA status post CABG. (left internal mammary artery to  LAD, saphenous vein graft to second diagonal, saphenous vein  graft to obtuse marginal 1, saphenous vein graft to posterior  descending).  .  Cardiomyopathy     Mildly reduced EF (45%).   . DM (diabetes mellitus) ([place])   . HTN (hypertension)   . Pleural effusion   . Shortness of breath     Past Surgical History:  Procedure Laterality Date  . bilateral cataract surgery    . CORONARY ARTERY BYPASS GRAFT      Social History Social History   Social History  . Marital status: Married    Spouse name: N/A  . Number of children: 2  . Years of education: N/A   Occupational History  . Retired    Social History Main Topics  . Smoking status: Never Smoker  . Smokeless tobacco: Never Used  . Alcohol use Not on file  . Drug use: Unknown  . Sexual activity: Not on file   Other Topics Concern  . Not on file   Social History Narrative  . No narrative on file     Allergies  Allergen Reactions  . Lisinopril Swelling    History reviewed. No pertinent family history.    Prior to Admission medications   Medication Sig Start Date End Date Taking? Authorizing Provider  amLODipine (NORVASC) 10 MG tablet Take 1 tablet (10 mg total) by mouth daily. 11/23/15  Yes Minus Breeding, MD  aspirin 81 MG tablet Take 81 mg by mouth daily.    Yes [provider]  cholecalciferol (VITAMIN D) 400 units TABS tablet Take 400 Units by mouth.   Yes [provider]  glipiZIDE (GLUCOTROL XL) 5 MG 24  hr tablet Take 5 mg by mouth daily.   Yes [provider]  isosorbide mononitrate (IMDUR) 60 MG 24 hr tablet TAKE ONE TABLET BY MOUTH DAILY. 09/09/16  Yes Minus Breeding, MD  losartan (COZAAR) 50 MG tablet Take 1 tablet (50 mg total) by mouth 2 (two) times daily. 11/23/15  Yes Minus Breeding, MD  metoprolol (LOPRESSOR) 50 MG tablet TAKE ONE TABLET BY MOUTH TWICE DAILY Patient taking differently: TAKE ONE TABLET BY MOUTH DAILY 02/22/16  Yes Minus Breeding, MD    Physical Exam:  Vitals:   09/20/16 1515 09/20/16 1600 09/20/16 1607 09/20/16 1645  BP: (!) 148/76 (!) 156/70  (!) 163/78  Pulse: 73 77  72  Resp: 19 14   20   Temp:   97.6 F (36.4 C)   SpO2: 99% 99%  97%   Constitutional: NAD, calm, comfortable  Eyes: PERRL, lids and conjunctivae normal ENMT: Mucous membranes are moist, without exudate or lesions  Neck: normal, supple, no masses, no thyromegaly Respiratory: clear to auscultation bilaterally, no wheezing, no crackles. Normal respiratory effort  Cardiovascular: Regular rate and rhythm, soft 1/6  murmurs, rubs or gallops. No extremity edema. 2+ pedal pulses. No carotid bruits.  Abdomen: Soft, non tender, No hepatosplenomegaly. Bowel sounds positive.  Musculoskeletal: no clubbing / cyanosis. Moves all extremities Skin: no jaundice, No lesions.  Neurologic:Decrease strain in the lower extremity to out of 5, upper extremity 1 out of 5, right upper extremity lower extremity are normal. She also has some decreased sensation on the left side. She has mild facial droop on the left Psychiatric:   Alert and oriented x 3. Normal mood.     Labs on Admission: I have personally reviewed following labs and imaging studies  CBC:  Recent Labs Lab 09/20/16 1406 09/20/16 1436  WBC 7.1  --   NEUTROABS 5.1  --   HGB 10.4* 10.9*  HCT 32.0* 32.0*  MCV 88.6  --   PLT 226  --     Basic Metabolic Panel:  Recent Labs Lab 09/20/16 1406 09/20/16 1436  NA 139 141  K 4.2 4.2  CL 109 109  CO2 22  --   GLUCOSE 205* 204*  BUN 33* 35*  CREATININE 2.04* 2.00*  CALCIUM 10.0  --     GFR: CrCl cannot be calculated (Unknown ideal weight.).  Liver Function Tests:  Recent Labs Lab 09/20/16 1406  AST 17  ALT 14  ALKPHOS 63  BILITOT 0.8  PROT 6.9  ALBUMIN 3.5   No results for input(s): LIPASE, AMYLASE in the last 168 hours. No results for input(s): AMMONIA in the last 168 hours.  Coagulation Profile:  Recent Labs Lab 09/20/16 1406  INR 1.01    Cardiac Enzymes: No results for input(s): CKTOTAL, CKMB, CKMBINDEX, TROPONINI in the last 168 hours.  BNP (last 3 results) No results for  input(s): PROBNP in the last 8760 hours.  HbA1C: No results for input(s): HGBA1C in the last 72 hours.  CBG: No results for input(s): GLUCAP in the last 168 hours.  Lipid Profile: No results for input(s): CHOL, HDL, LDLCALC, TRIG, CHOLHDL, LDLDIRECT in the last 72 hours.  Thyroid Function Tests: No results for input(s): TSH, T4TOTAL, FREET4, T3FREE, THYROIDAB in the last 72 hours.  Anemia Panel: No results for input(s): VITAMINB12, FOLATE, FERRITIN, TIBC, IRON, RETICCTPCT in the last 72 hours.  Urine analysis:    Component Value Date/Time   COLORURINE YELLOW 09/20/2016 1530   APPEARANCEUR CLEAR 09/20/2016 1530   LABSPEC 1.010  09/20/2016 1530   PHURINE 6.0 09/20/2016 1530   GLUCOSEU 50 (A) 09/20/2016 1530   HGBUR SMALL (A) 09/20/2016 1530   BILIRUBINUR NEGATIVE 09/20/2016 1530   KETONESUR NEGATIVE 09/20/2016 1530   PROTEINUR 100 (A) 09/20/2016 1530   UROBILINOGEN 0.2 06/30/2011 1214   NITRITE NEGATIVE 09/20/2016 1530   LEUKOCYTESUR NEGATIVE 09/20/2016 1530    Sepsis Labs: @LABRCNTIP (procalcitonin:4,lacticidven:4) )No results found for this or any previous visit (from the past 240 hour(s)).   Radiological Exams on Admission: Ct Head Wo Contrast  Result Date: 09/20/2016 CLINICAL DATA:  Left arm and leg weakness since 7 p.m. EXAM: CT HEAD WITHOUT CONTRAST TECHNIQUE: Contiguous axial images were obtained from the base of the skull through the vertex without intravenous contrast. COMPARISON:  05/13/2010 FINDINGS: Brain: No evidence of acute infarction, hemorrhage, extra-axial collection, ventriculomegaly, or mass effect. Generalized cerebral atrophy. Periventricular white matter low attenuation likely secondary to microangiopathy. Vascular: Cerebrovascular atherosclerotic calcifications are noted. Skull: Negative for fracture or focal lesion. Sinuses/Orbits: Visualized portions of the orbits are unremarkable. Visualized portions of the paranasal sinuses and mastoid air cells are  unremarkable. Other: None. IMPRESSION: 1. No acute intracranial pathology. Electronically Signed   By: Kathreen Devoid   On: 09/20/2016 13:43    EKG: Independently reviewed.  Assessment/Plan Active Problems:   Stroke (cerebrum) (HCC)   Diabetes mellitus (HCC)   Hyperlipidemia   Essential hypertension   Coronary atherosclerosis   Glaucoma suspect   Nonproliferative diabetic retinopathy (HCC)   Status post intraocular lens implant    Acute-L sided weakness, dysarthria / CVA Not a TPA candidate as  last known normal was the night prior to hospitalization. CT head shows Risk factors for CVA include age, h/o CAD, cardiomyopathy, with EF 45%,  diabetes, hypertension, hyperlipidemia.    Admit to Tele / Inpatient Stroke order set  MRI/MRA brain   MRA neck Allow permissive HTN Echo  PT/OT/SLP  lipid panel A1C Aspirin  Neuro  Following, appreciate involvement  Care management for possible   inpatient rehab   Type II Diabetes Current blood sugar level was 204 Lab Results  Component Value Date   HGBA1C (H) 05/14/2010    6.8 (NOTE)                                                                       According to the ADA Clinical Practice Recommendations for 2011, when HbA1c is used as a screening test:   >=6.5%   Diagnostic of Diabetes Mellitus           (if abnormal result  is confirmed)  5.7-6.4%   Increased risk of developing Diabetes Mellitus  References:Diagnosis and Classification of Diabetes Mellitus,Diabetes XAJO,8786,76(HMCNO 1):S62-S69 and Standards of Medical Care in         Diabetes - 2011,Diabetes Care,2011,34  (Suppl 1):S11-S61.   Hgb A1C Hold Glucotrol SSI if repeat glucose levels after meal are elevated   . Hypertension BP 163/78   Pulse 72 , currently on permissive hypertension Continue home anti-hypertensive medications in am  Add Hydralazine Q6 hours as needed for BP 210/110   Hyperlipidemia Continue home statins  Chronic kidney disease stage 3- 4  baseline  creatinine    Current Cr 2 Lab Results  Component Value Date  CREATININE 2.00 (H) 09/20/2016   CREATININE 2.04 (H) 09/20/2016   CREATININE 1.22 (H) 06/30/2011   IVF Patient took Cozaar prior to admission, may need to hold it in am if Cr up due to contrast in MRI   Repeat CMET in am  Anemia of chronic disease Hemoglobin on admission 10.9 at baseline  Repeat CBC in am  No transfusion is indicated at this time    DVT prophylaxis:  Heparin  Code Status:   Full     Family Communication:  Discussed with patient Disposition Plan: Expect patient to be discharged to home after condition improves Consults called:    Neuro per EDP Admission status:Tele  Inpatient      [place], PA-C [facility]   09/20/2016, 5:36 PM

## 2016-09-20 NOTE — Consult Note (Signed)
Requesting Physician: Dr. Gilford Raid    Chief Complaint: left sided weakness  History obtained from:  Patient     HPI:                                                                                                                                         [name] is an 83 y.o. female with history of CAD, diabetes, hypertension, hyperlipidemia. Patient states that she was watching TV at 7 PM when she does that she had left arm and leg weakness. She also noted that she had left-sided decreased sensation. Due to the symptoms continuing today patient was brought to the emergency department. Patient states she does take an aspirin daily. Neuro was called for possible stroke. CT of head was negative for any acute bleed or infarct.  Date last known well: Date: 09/19/2016 Time last known well: Time: 19:00 tPA Given: No: minimal symptoms and out of window   Past Medical History:  Diagnosis Date  . CAD (coronary artery disease)    status post stenting of the RCA status post CABG. (left internal mammary artery to  LAD, saphenous vein graft to second diagonal, saphenous vein  graft to obtuse marginal 1, saphenous vein graft to posterior  descending).  . Cardiomyopathy     Mildly reduced EF (45%).   . DM (diabetes mellitus) ([place])   . HTN (hypertension)   . Pleural effusion   . Shortness of breath     Past Surgical History:  Procedure Laterality Date  . bilateral cataract surgery    . CORONARY ARTERY BYPASS GRAFT      No family history on file. Social History:  reports that she has never smoked. She has never used smokeless tobacco. Her alcohol and drug histories are not on file.  Allergies:  Allergies  Allergen Reactions  . Lisinopril Swelling    Medications:                                                                                                                           Current Facility-Administered Medications  Medication Dose Route Frequency Provider Last Rate Last Dose   . 0.9 %  sodium chloride infusion   Intravenous Continuous Pisciotta, Nicole, PA-C      . aspirin chewable tablet 324 mg  324 mg Oral Once Pisciotta, Elmyra Ricks, PA-C  Current Outpatient Prescriptions  Medication Sig Dispense Refill  . amLODipine (NORVASC) 10 MG tablet Take 1 tablet (10 mg total) by mouth daily. 90 tablet 3  . aspirin 81 MG tablet Take 81 mg by mouth daily.     . cholecalciferol (VITAMIN D) 400 units TABS tablet Take 400 Units by mouth.    Marland Kitchen glipiZIDE (GLUCOTROL XL) 5 MG 24 hr tablet Take 5 mg by mouth daily.    . isosorbide mononitrate (IMDUR) 60 MG 24 hr tablet TAKE ONE TABLET BY MOUTH DAILY. 30 tablet 6  . losartan (COZAAR) 50 MG tablet Take 1 tablet (50 mg total) by mouth 2 (two) times daily. 60 tablet 9  . metoprolol (LOPRESSOR) 50 MG tablet TAKE ONE TABLET BY MOUTH TWICE DAILY (Patient taking differently: TAKE ONE TABLET BY MOUTH DAILY) 180 tablet 2     ROS:                                                                                                                                       History obtained from the patient  General ROS: negative for - chills, fatigue, fever, night sweats, weight gain or weight loss Psychological ROS: negative for - behavioral disorder, hallucinations, memory difficulties, mood swings or suicidal ideation Ophthalmic ROS: negative for - blurry vision, double vision, eye pain or loss of vision ENT ROS: negative for - epistaxis, nasal discharge, oral lesions, sore throat, tinnitus or vertigo Allergy and Immunology ROS: negative for - hives or itchy/watery eyes Hematological and Lymphatic ROS: negative for - bleeding problems, bruising or swollen lymph nodes Endocrine ROS: negative for - galactorrhea, hair pattern changes, polydipsia/polyuria or temperature intolerance Respiratory ROS: negative for - cough, hemoptysis, shortness of breath or wheezing Cardiovascular ROS: negative for - chest pain, dyspnea on exertion, edema or irregular  heartbeat Gastrointestinal ROS: negative for - abdominal pain, diarrhea, hematemesis, nausea/vomiting or stool incontinence Genito-Urinary ROS: negative for - dysuria, hematuria, incontinence or urinary frequency/urgency Musculoskeletal ROS: negative for - joint swelling or muscular weakness Neurological ROS: as noted in HPI Dermatological ROS: negative for rash and skin lesion changes  Neurologic Examination:                                                                                                      Blood pressure (!) 161/64, pulse 72, resp. rate 19, SpO2 98 %.  HEENT-  Normocephalic, no lesions, without obvious abnormality.  Normal external eye and conjunctiva.  Normal TM's bilaterally.  Normal auditory  canals and external ears. Normal external nose, mucus membranes and septum.  Normal pharynx. Cardiovascular- S1, S2 normal, pulses palpable throughout   Lungs- chest clear, no wheezing, rales, normal symmetric air entry Abdomen- normal findings: bowel sounds normal Extremities- no edema Lymph-no adenopathy palpable Musculoskeletal-no joint tenderness, deformity or swelling Skin-warm and dry, no hyperpigmentation, vitiligo, or suspicious lesions  Neurological Examination Mental Status: Alert, oriented, thought content appropriate.  Speech fluent without evidence of aphasia.  Able to follow 3 step commands without difficulty. Cranial Nerves: QM:GQQPYP fields grossly normal,  III,IV, VI: ptosis not present, extra-ocular motions intact bilaterally, pupils equal, round, reactive to light and accommodation V,VII: smile symmetric, facial light decreased on the left side, splitting midline to light touch, splitting midline to vibration. VIII: hearing normal bilaterally IX,X: uvula rises symmetrically XI: bilateral shoulder shrug XII: midline tongue extension Motor: Right : Upper extremity   5/5    Left:     Upper extremity   1/5  Lower extremity   5/5     Lower extremity   2/5 --Of  note patient showed actually stronger strength when she was distracted, when I held her arm up and asked her to push her arm down she would actually hold her arm antigravity. She would not allow her arm to hit her face. When asked to turn her head to the left she stated that she had no strength could not when asked to turn her head to the right she had full 5 out of 5 strength( left SCM). Positive Hoover's Tone and bulk:normal tone throughout; no atrophy noted Sensory: Pinprick and light touch decreased on left arm and leg Deep Tendon Reflexes: 2+ and symmetric throughout Plantars: Right: downgoing   Left: downgoing Cerebellar: normal finger-to-nose on the right,  and normal heel-to-shin test on the right Gait: Not tested   Lab Results: Basic Metabolic Panel:  Recent Labs Lab 09/20/16 1406 09/20/16 1436  NA 139 141  K 4.2 4.2  CL 109 109  CO2 22  --   GLUCOSE 205* 204*  BUN 33* 35*  CREATININE 2.04* 2.00*  CALCIUM 10.0  --     Liver Function Tests:  Recent Labs Lab 09/20/16 1406  AST 17  ALT 14  ALKPHOS 63  BILITOT 0.8  PROT 6.9  ALBUMIN 3.5   No results for input(s): LIPASE, AMYLASE in the last 168 hours. No results for input(s): AMMONIA in the last 168 hours.  CBC:  Recent Labs Lab 09/20/16 1406 09/20/16 1436  WBC 7.1  --   NEUTROABS 5.1  --   HGB 10.4* 10.9*  HCT 32.0* 32.0*  MCV 88.6  --   PLT 226  --     Cardiac Enzymes: No results for input(s): CKTOTAL, CKMB, CKMBINDEX, TROPONINI in the last 168 hours.  Lipid Panel: No results for input(s): CHOL, TRIG, HDL, CHOLHDL, VLDL, LDLCALC in the last 168 hours.  CBG: No results for input(s): GLUCAP in the last 168 hours.  Microbiology: Results for orders placed or performed during the hospital encounter of 12/02/09  MRSA PCR Screening     Status: None   Collection Time: 12/02/09 12:24 PM  Result Value Ref Range Status   MRSA by PCR  NEGATIVE Final    NEGATIVE        The GeneXpert MRSA Assay  (FDA approved for NASAL specimens only), is one component of a comprehensive MRSA colonization surveillance program. It is not intended to diagnose MRSA infection nor to guide or monitor treatment for MRSA  infections.  Urine culture     Status: None   Collection Time: 12/13/09  9:44 AM  Result Value Ref Range Status   Specimen Description URINE, CATHETERIZED  Final   Special Requests NONE  Final   Culture  Setup Time [id number]  Final   Colony Count >=100,000 COLONIES/ML  Final   Culture PSEUDOMONAS AERUGINOSA ESCHERICHIA COLI  Final   Report Status 12/15/2009 FINAL  Final   Organism ID, Bacteria PSEUDOMONAS AERUGINOSA  Final   Organism ID, Bacteria ESCHERICHIA COLI  Final      Susceptibility   Escherichia coli - MIC    AMPICILLIN <=2 Sensitive     CEFAZOLIN <=4 Sensitive     CEFTRIAXONE <=1 Sensitive     CIPROFLOXACIN <=0.25 Sensitive     GENTAMICIN <=1 Sensitive     LEVOFLOXACIN <=0.12 Sensitive     NITROFURANTOIN <=16 Sensitive     TOBRAMYCIN <=1 Sensitive     TRIMETH/SULFA <=20 Sensitive    Pseudomonas aeruginosa - MIC    CEFEPIME <=1 Sensitive     CEFTAZIDIME <=1 Sensitive     CIPROFLOXACIN <=0.25 Sensitive     GENTAMICIN 2 Sensitive     IMIPENEM <=1 Sensitive     TOBRAMYCIN* <=1 Sensitive      * SET UP TIME:  [id number]    Coagulation Studies:  Recent Labs  09/20/16 1406  LABPROT 13.3  INR 1.01    Imaging: Ct Head Wo Contrast  Result Date: 09/20/2016 CLINICAL DATA:  Left arm and leg weakness since 7 p.m. EXAM: CT HEAD WITHOUT CONTRAST TECHNIQUE: Contiguous axial images were obtained from the base of the skull through the vertex without intravenous contrast. COMPARISON:  05/13/2010 FINDINGS: Brain: No evidence of acute infarction, hemorrhage, extra-axial collection, ventriculomegaly, or mass effect. Generalized cerebral atrophy. Periventricular white matter low attenuation likely secondary to microangiopathy. Vascular: Cerebrovascular atherosclerotic  calcifications are noted. Skull: Negative for fracture or focal lesion. Sinuses/Orbits: Visualized portions of the orbits are unremarkable. Visualized portions of the paranasal sinuses and mastoid air cells are unremarkable. Other: None. IMPRESSION: 1. No acute intracranial pathology. Electronically Signed   By: Kathreen Devoid   On: 09/20/2016 13:43       Assessment and plan discussed with with attending physician and they are in agreement.    Etta Quill PA-C [facility] [phone]  09/20/2016, 3:29 PM  Neuro hospitalist addendum Patient seen and examined independently. I agree with the history and physical as documented above. I helped formulate the plan as documented below.  Assessment: 83 y.o. female with subjective decreased sensation on the left side with multiple functional findings on exam along with left sided weakness that spares the face and is somewhat effort dependent. Patient does have multiple risk factors for stroke less cannot rule out possible small stroke with may be some embellishment.  Stroke Risk Factors - diabetes mellitus and hypertension  Impression Evaluate for stroke/TIA DM HTN Rule out conversion disorder  Recommend 1. HgbA1c, fasting lipid panel 2. MRI/MRA of the brain without contrast 3. PT consult, OT consult, Speech consult 4. Echocardiogram 5. 40 mg of Atorvastatin (not on already) 6. Prophylactic therapy-Antiplatelet med: Aspirin 81 mg  7. Risk factor modification 8. Telemetry monitoring 9. Frequent neuro checks 10 NPO until passes stroke swallow screen 11 please page stroke NP  Or  PA  Or MD from 8am -4 pm  as this patient from this time will be  followed by the stroke.   You can look them up on [email or web address]  Password [email or web address]   -- Amie Portland, MD [facility] [phone]  If 7pm to 7am, please call on call as listed on AMION.

## 2016-09-20 NOTE — ED Provider Notes (Signed)
[facility] DEPT Provider Note   CSN: [id number] Arrival date & time: 09/20/16  1203     History   Chief Complaint Chief Complaint  Patient presents with  . Extremity Weakness    left    HPI   [name] is a 83 y.o. female  with past medical history significant for CAD (status post CABG), non-insulin-dependent diabetes, hypertension, hyperlipidemia complaining of left-sided upper and lower extremity weakness onset last night at approximately 7 PM while patient was watching TV, she noticed feeling not right and weak when she stood up to go to the restroom. She has not fallen. She denies any change in her vision, dysarthria, ataxia, nausea, vomiting, headache, palpitations chest pain, fever chills. She is not anticoagulated but she does take a daily 2x 81mg   In AM, last dose was yesterday, her son advised her not to take her to it today. On review of systems she notes that she has reduced sensation on the left side lower extremity worsening upper extremity .  Past Medical History:  Diagnosis Date  . CAD (coronary artery disease)    status post stenting of the RCA status post CABG. (left internal mammary artery to  LAD, saphenous vein graft to second diagonal, saphenous vein  graft to obtuse marginal 1, saphenous vein graft to posterior  descending).  . Cardiomyopathy     Mildly reduced EF (45%).   . DM (diabetes mellitus) ([place])   . HTN (hypertension)   . Pleural effusion   . Shortness of breath     Patient Active Problem List   Diagnosis Date Noted  . Status post intraocular lens implant 06/23/2011  . Glaucoma suspect 12/30/2010  . Macular hole 12/30/2010  . Nonproliferative diabetic retinopathy ([place]) 12/30/2010  . Syncope 06/17/2010  . CARDIOMYOPATHY 04/15/2010  . PLEURAL EFFUSION 04/14/2010  . SHORTNESS OF BREATH 04/14/2010  . CORONARY ATHEROSCLEROSIS NATIVE CORONARY ARTERY 01/19/2009  . DYSLIPIDEMIA 01/06/2009  . DM 01/05/2009  . Essential hypertension 01/05/2009    . Coronary atherosclerosis 01/05/2009    Past Surgical History:  Procedure Laterality Date  . bilateral cataract surgery    . CORONARY ARTERY BYPASS GRAFT      OB History    No data available       Home Medications    Prior to Admission medications   Medication Sig Start Date End Date Taking? Authorizing Provider  amLODipine (NORVASC) 10 MG tablet Take 1 tablet (10 mg total) by mouth daily. 11/23/15  Yes Minus Breeding, MD  aspirin 81 MG tablet Take 81 mg by mouth daily.    Yes [provider]  cholecalciferol (VITAMIN D) 400 units TABS tablet Take 400 Units by mouth.   Yes [provider]  glipiZIDE (GLUCOTROL XL) 5 MG 24 hr tablet Take 5 mg by mouth daily.   Yes [provider]  isosorbide mononitrate (IMDUR) 60 MG 24 hr tablet TAKE ONE TABLET BY MOUTH DAILY. 09/09/16  Yes Minus Breeding, MD  losartan (COZAAR) 50 MG tablet Take 1 tablet (50 mg total) by mouth 2 (two) times daily. 11/23/15  Yes Minus Breeding, MD  metoprolol (LOPRESSOR) 50 MG tablet TAKE ONE TABLET BY MOUTH TWICE DAILY Patient taking differently: TAKE ONE TABLET BY MOUTH DAILY 02/22/16  Yes Minus Breeding, MD    Family History No family history on file.  Social History Social History  Substance Use Topics  . Smoking status: Never Smoker  . Smokeless tobacco: Never Used  . Alcohol use Not on file  Allergies   Lisinopril   Review of Systems Review of Systems  A complete review of systems was obtained and all systems are negative except as noted in the HPI and PMH.   Physical Exam Updated Vital Signs BP (!) 154/62   Pulse 73   Resp (!) 23   SpO2 98%   Physical Exam  Constitutional: She is oriented to person, place, and time. She appears well-developed and well-nourished. No distress.  HENT:  Head: Normocephalic.  Mouth/Throat: Oropharynx is clear and moist.  Eyes: Conjunctivae are normal.  Neck: Normal range of motion. No JVD present. No tracheal deviation  present.  Cardiovascular: Normal rate, regular rhythm and intact distal pulses.   Radial pulse equal bilaterally  Pulmonary/Chest: Effort normal and breath sounds normal. No stridor. No respiratory distress. She has no wheezes. She has no rales. She exhibits no tenderness.  Abdominal: Soft. She exhibits no distension and no mass. There is no tenderness. There is no rebound and no guarding. No hernia.  Musculoskeletal: Normal range of motion. She exhibits no edema or tenderness.  No calf asymmetry, superficial collaterals, palpable cords, edema, Homans sign negative bilaterally.    Neurological: She is alert and oriented to person, place, and time. A cranial nerve deficit is present.  II-Visual fields grossly intact. III/IV/VI-Extraocular movements intact.  Pupils reactive bilaterally. V/VII-Smile symmetric, equal eyebrow raise,  facial sensation intact VIII- Hearing grossly intact IX/X-Normal gag XI-bilateral shoulder shrug XII-midline tongue extension Motor: 3 out of 5 strength to left upper and lower extremity, reduced sensation to the left upper and lower extremity Cerebellar: Normal finger-to-nose  bilaterally   Skin: Skin is warm. Capillary refill takes less than 2 seconds. She is not diaphoretic.  Psychiatric: She has a normal mood and affect.  Nursing note and vitals reviewed.    ED Treatments / Results  Labs (all labs ordered are listed, but only abnormal results are displayed) Labs Reviewed  CBC - Abnormal; Notable for the following:       Result Value   RBC 3.61 (*)    Hemoglobin 10.4 (*)    HCT 32.0 (*)    All other components within normal limits  COMPREHENSIVE METABOLIC PANEL - Abnormal; Notable for the following:    Glucose, Bld 205 (*)    BUN 33 (*)    Creatinine, Ser 2.04 (*)    GFR calc non Af Amer 21 (*)    GFR calc Af Amer 24 (*)    All other components within normal limits  I-STAT CHEM 8, ED - Abnormal; Notable for the following:    BUN 35 (*)     Creatinine, Ser 2.00 (*)    Glucose, Bld 204 (*)    Hemoglobin 10.9 (*)    HCT 32.0 (*)    All other components within normal limits  ETHANOL  PROTIME-INR  APTT  DIFFERENTIAL  RAPID URINE DRUG SCREEN, HOSP PERFORMED  URINALYSIS, ROUTINE W REFLEX MICROSCOPIC  I-STAT TROPONIN, ED    EKG  EKG Interpretation  Date/Time:  Tuesday September 20 2016 14:49:25 EDT Ventricular Rate:  75 PR Interval:    QRS Duration: 91 QT Interval:  349 QTC Calculation: 390 R Axis:   -13 Text Interpretation:  Sinus rhythm Ventricular premature complex Nonspecific T abnormalities, lateral leads Confirmed by Isla Pence [phone]) on 09/20/2016 2:55:13 PM Also confirmed by Isla Pence [phone]), editor Laurena Spies [phone])  on 09/20/2016 2:59:00 PM       Radiology Ct Head Wo Contrast  Result Date: 09/20/2016  CLINICAL DATA:  Left arm and leg weakness since 7 p.m. EXAM: CT HEAD WITHOUT CONTRAST TECHNIQUE: Contiguous axial images were obtained from the base of the skull through the vertex without intravenous contrast. COMPARISON:  05/13/2010 FINDINGS: Brain: No evidence of acute infarction, hemorrhage, extra-axial collection, ventriculomegaly, or mass effect. Generalized cerebral atrophy. Periventricular white matter low attenuation likely secondary to microangiopathy. Vascular: Cerebrovascular atherosclerotic calcifications are noted. Skull: Negative for fracture or focal lesion. Sinuses/Orbits: Visualized portions of the orbits are unremarkable. Visualized portions of the paranasal sinuses and mastoid air cells are unremarkable. Other: None. IMPRESSION: 1. No acute intracranial pathology. Electronically Signed   By: Kathreen Devoid   On: 09/20/2016 13:43    Procedures Procedures (including critical care time)  Medications Ordered in ED Medications  aspirin chewable tablet 324 mg (not administered)  0.9 %  sodium chloride infusion (not administered)     Initial Impression / Assessment and Plan / ED  Course  I have reviewed the triage vital signs and the nursing notes.  Pertinent labs & imaging results that were available during my care of the patient were reviewed by me and considered in my medical decision making (see chart for details).     Vitals:   09/20/16 1215 09/20/16 1230 09/20/16 1300  BP: (!) 178/73 (!) 176/70 (!) 154/62  Pulse: 76 74 73  Resp: 17 (!) 23 (!) 23  SpO2: 99% 98% 98%    Medications  aspirin chewable tablet 324 mg (not administered)  0.9 %  sodium chloride infusion (not administered)    [name] is 83 y.o. female presenting with Left upper and lower extremity onset last night at approximately 7 PM, unfortunately, this patient is outside of the code stroke window, multiple cardiac risk factors. She has not had any aspirin today. CT and blood work pending, will need admission for CVA.  This is a shared visit with the attending physician who personally evaluated the patient and agrees with the care plan.   Hospitalist admission, discussed with Dr. Malen Gauze who will consult  Final Clinical Impressions(s) / ED Diagnoses   Final diagnoses:  Cerebrovascular accident (CVA), unspecified mechanism [facility])    New Prescriptions New Prescriptions   No medications on file     Waynetta Pean 09/20/16 1506    Kasidi Shanker, Charna Elizabeth 09/20/16 1512    Isla Pence, MD 09/20/16 1540

## 2016-09-20 NOTE — Progress Notes (Signed)
Paged k. schorr and notified MRI that patient's son does not want contrast dye given to mother due to high GFR rate and CrCl.

## 2016-09-21 ENCOUNTER — Inpatient Hospital Stay (HOSPITAL_COMMUNITY): Payer: Medicare Other

## 2016-09-21 DIAGNOSIS — E118 Type 2 diabetes mellitus with unspecified complications: Secondary | ICD-10-CM

## 2016-09-21 DIAGNOSIS — I340 Nonrheumatic mitral (valve) insufficiency: Secondary | ICD-10-CM

## 2016-09-21 DIAGNOSIS — I10 Essential (primary) hypertension: Secondary | ICD-10-CM

## 2016-09-21 DIAGNOSIS — I2510 Atherosclerotic heart disease of native coronary artery without angina pectoris: Secondary | ICD-10-CM

## 2016-09-21 DIAGNOSIS — I361 Nonrheumatic tricuspid (valve) insufficiency: Secondary | ICD-10-CM

## 2016-09-21 DIAGNOSIS — G8194 Hemiplegia, unspecified affecting left nondominant side: Secondary | ICD-10-CM

## 2016-09-21 DIAGNOSIS — I639 Cerebral infarction, unspecified: Principal | ICD-10-CM

## 2016-09-21 LAB — GLUCOSE, CAPILLARY
GLUCOSE-CAPILLARY: 207 mg/dL — AB (ref 65–99)
GLUCOSE-CAPILLARY: 174 mg/dL — AB (ref 65–99)
Glucose-Capillary: 146 mg/dL — ABNORMAL HIGH (ref 65–99)
Glucose-Capillary: 112 mg/dL — ABNORMAL HIGH (ref 65–99)

## 2016-09-21 LAB — ECHOCARDIOGRAM COMPLETE
Ao-asc: 37 cm
Area-P 1/2: 2.75 cm2
E/e' ratio: 6.26
EWDT: 275 ms
FS: 31 % (ref 28–44)
HEIGHTINCHES: 63 in
IVS/LV PW RATIO, ED: 1.06
LA ID, A-P, ES: 40 mm
LA diam end sys: 40 mm
LA diam index: 2.14 cm/m2
LA vol A4C: 66.7 ml
LA vol index: 35.8 mL/m2
LA vol: 66.7 mL
LV E/e' medial: 6.26
LV E/e'average: 6.26
LV PW d: 12.6 mm — AB (ref 0.6–1.1)
LV TDI E'LATERAL: 11
LV TDI E'MEDIAL: 5.55
LVELAT: 11 cm/s
LVOT area: 2.54 cm2
LVOTD: 18 mm
MV Dec: 275
MV pk A vel: 114 m/s
MVPKEVEL: 68.9 m/s
P 1/2 time: 80 ms
PV Reg vel dias: 109 cm/s
RV LATERAL S' VELOCITY: 10.8 cm/s
TAPSE: 24.4 mm
WEIGHTICAEL: 2688 [oz_av]

## 2016-09-21 LAB — IRON AND TIBC
Iron: 67 ug/dL (ref 28–170)
Saturation Ratios: 29 % (ref 10.4–31.8)
TIBC: 234 ug/dL — ABNORMAL LOW (ref 250–450)
UIBC: 167 ug/dL

## 2016-09-21 LAB — LIPID PANEL
CHOL/HDL RATIO: 4 ratio
CHOLESTEROL: 167 mg/dL (ref 0–200)
HDL: 42 mg/dL (ref >40)
LDL Cholesterol: 101 mg/dL — ABNORMAL HIGH (ref 0–99)
TRIGLYCERIDES: 118 mg/dL (ref <150)
VLDL: 24 mg/dL (ref 0–40)

## 2016-09-21 LAB — FERRITIN: FERRITIN: 71 ng/mL (ref 11–307)

## 2016-09-21 MED ORDER — ATORVASTATIN CALCIUM 40 MG PO TABS
40.0000 mg | ORAL_TABLET | Freq: Every day | ORAL | Status: DC
  Administered 2016-09-21 – 2016-09-22 (×2): 40 mg via ORAL
  Filled 2016-09-21 (×2): qty 1

## 2016-09-21 MED ORDER — CLOPIDOGREL BISULFATE 75 MG PO TABS
75.0000 mg | ORAL_TABLET | Freq: Every day | ORAL | Status: DC
Start: 2016-09-21 — End: 2016-09-22
  Administered 2016-09-21 – 2016-09-22 (×2): 75 mg via ORAL
  Filled 2016-09-21 (×2): qty 1

## 2016-09-21 NOTE — Progress Notes (Signed)
Rehab admissions - I met with patient at her bedside.  She is interested in inpatient rehab admission.  I will need PT evaluation.  I will begin pre-authorization with [facility] medicare to request inpatient rehab admission.  I will follow up in am.  Call me for questions.  #[phone]

## 2016-09-21 NOTE — Progress Notes (Signed)
STROKE TEAM PROGRESS NOTE   HISTORY OF PRESENT ILLNESS (per record) [name] is an 83 y.o. female with history of CAD, diabetes, hypertension, hyperlipidemia presenting with left-sided weakness and decreased sensation on the left side.  Patient states that she was watching TV at 7 PM when she does that she had left arm and leg weakness. She also noted that she had left-sided decreased sensation. Due to the symptoms continuing today patient was brought to the emergency department. Patient states she does take an aspirin daily. Neuro was called for possible stroke. CT of head was negative for any acute bleed or infarct.  Date last known well: Date: 09/19/2016 Time last known well: Time: 19:00  Patient was not administered IV t-PA secondary to arriving outside of the treatment window. She was admitted to General Neurology for further evaluation and treatment.    SUBJECTIVE (INTERVAL HISTORY) No family at the bedside.  The patient is awake, alert, and follows all commands appropriately.      OBJECTIVE Temp:  [97.6 F (36.4 C)-98.9 F (37.2 C)] 98.4 F (36.9 C) (08/22 1400) Pulse Rate:  [59-84] 59 (08/22 1400) Cardiac Rhythm: Normal sinus rhythm (08/22 0700) Resp:  [14-20] 18 (08/22 1400) BP: (156-186)/(50-78) 160/64 (08/22 1400) SpO2:  [93 %-99 %] 96 % (08/22 1400) Weight:  [76.2 kg (168 lb)] 76.2 kg (168 lb) (08/22 0100)  CBC:   Recent Labs Lab 09/20/16 1406 09/20/16 1436  WBC 7.1  --   NEUTROABS 5.1  --   HGB 10.4* 10.9*  HCT 32.0* 32.0*  MCV 88.6  --   PLT 226  --     Basic Metabolic Panel:   Recent Labs Lab 09/20/16 1406 09/20/16 1436  NA 139 141  K 4.2 4.2  CL 109 109  CO2 22  --   GLUCOSE 205* 204*  BUN 33* 35*  CREATININE 2.04* 2.00*  CALCIUM 10.0  --     Lipid Panel:     Component Value Date/Time   CHOL 167 09/21/2016 0728   TRIG 118 09/21/2016 0728   HDL 42 09/21/2016 0728   CHOLHDL 4.0 09/21/2016 0728   VLDL 24 09/21/2016 0728   LDLCALC 101  (H) 09/21/2016 0728   HgbA1c:  Lab Results  Component Value Date   HGBA1C 7.7 (H) 09/20/2016   Urine Drug Screen:     Component Value Date/Time   LABOPIA NONE DETECTED 09/20/2016 1530   COCAINSCRNUR NONE DETECTED 09/20/2016 1530   LABBENZ NONE DETECTED 09/20/2016 1530   AMPHETMU NONE DETECTED 09/20/2016 1530   THCU NONE DETECTED 09/20/2016 1530   LABBARB NONE DETECTED 09/20/2016 1530    Alcohol Level     Component Value Date/Time   ETH <5 09/20/2016 1406    IMAGING  Ct Head Wo Contrast 09/20/2016 IMPRESSION: 1. No acute intracranial pathology.  Mr Brain 2 Contrast Mr [facility] Wo Contrast Mr Jodene Nam Neck Wo Contrast 09/20/2016 IMPRESSION: 1. 11 mm acute/early subacute infarction involving right posterior limb of internal capsule and lateral thalamus. No acute hemorrhage or mass effect. 2. Advanced chronic microvascular ischemic changes and parenchymal volume loss of the brain. 3. Patent carotid and vertebral arteries. No aneurysm or hemodynamically significant stenosis utilizing NASCET criteria. 4. Patent circle of Willis. No large vessel occlusion, aneurysm, or significant stenosis.   TTE 09/21/2016 Study Conclusions - Left ventricle: The cavity size was normal. Wall thickness was increased in a pattern of mild LVH. Systolic function was normal.  The estimated ejection fraction was in the range of  55% to 60%.  Wall motion was normal; there were no regional wall motion abnormalities. Doppler parameters are consistent with abnormal left ventricular relaxation (grade 1 diastolic dysfunction). - Mitral valve: There was mild regurgitation. - Left atrium: The atrium was mildly dilated. - Atrial septum: No defect or patent foramen ovale was identified. Impressions: - No cardiac source of emboli was indentified.   PHYSICAL EXAM Pleasant elderly lady not in distress. . Afebrile. Head is nontraumatic. Neck is supple without bruit.    Cardiac exam no murmur or gallop. Lungs are clear  to auscultation. Distal pulses are well felt. Neurological Exam :  Awake alert oriented x 3 normal speech and language except for mild dysarthria. Mild left lower face asymmetry. Tongue midline. No drift. Mild diminished fine finger movements on left. Orbits right over left upper extremity. Mild left grip weak.. Normal sensation . Normal coordination.  ASSESSMENT/PLAN [name] is a 83 y.o. female with history of CAD, diabetes, hypertension, hyperlipidemia presenting with left-sided weakness and decreased sensation on the left side. She did not receive IV t-PA due to arriving outside of the treatment window.   Stroke: 11 mm acute/early subacute infarction involving right posterior limb of internal capsule and lateral thalamus  Resultant left hemiplegia  CT head: no acute stroke  MRI head: 11 mm acute/early subacute infarction involving right posterior limb of internal capsule and lateral thalamus  MRA head: No LVO or high-grade stenosis  MRA neck: No LVO or high-grade stenosis  2D Echo: EF 55-60%. No source of embolus   LDL 101  HgbA1c 7.7  Heparin 5,000 units [place] every 8 hours for VTE prophylaxis Diet heart healthy/carb modified Room service appropriate? Yes; Fluid consistency: Thin  aspirin 162 mg daily prior to admission, now on aspirin 325 mg daily and clopidogrel 75 mg daily  Patient counseled to be compliant with her antithrombotic medications  Ongoing aggressive stroke risk factor management  Therapy recommendations: OT recommends CIR.  Seen by Rehab for admission contingent on PT recommendations.  PT consult pending.  Disposition:  pending  Hypertension  Stable  Permissive hypertension (OK if < 220/120) but gradually normalize in 5-7 days  Long-term BP goal normotensive  Hyperlipidemia  Home meds: none  LDL 101, goal < 70  Add atorvastatin 40 mg PO daily  Continue statin at discharge  Diabetes  HgbA1c 7.7, goal < 7.0  Uncontrolled  Diabetes  Coordinator consulted  Other Stroke Risk Factors  Advanced age  Obesity, Body mass index is 29.76 kg/m., recommend weight loss, diet and exercise as appropriate   Other Active Problems  None  Hospital day # 1  I have personally examined this patient, reviewed notes, independently viewed imaging studies, participated in medical decision making and plan of care.ROS completed by me personally and pertinent positives fully documented  I have made any additions or clarifications directly to the above note.  She presented with left facial weakness and slurred speech secondary to small right internal capsule infarct from small vessel disease. Recommend change aspirin to Plavix for stroke prevention. Physical occupational therapy and rehabilitation consults. Continue ongoing stroke workup. Long discussion with the daughter at the bedside and answered questions. Greater than 50% time during this 35 minute visit was spent on counseling and coordination of care but would recommend a stroke, discussion about risk factor evaluation, treatment and answering questions. Antony Contras, MD Medical Director [place] Pager: [phone] 09/21/2016 5:55 PM   To contact Stroke Continuity provider, please refer to [email or web address]  After hours, contact General Neurology

## 2016-09-21 NOTE — Progress Notes (Signed)
Please advise if carotid duplex still needed. MRA neck was performed 09/20/16.   Vascular lab [phone] Landry Mellow, RDMS, RVT

## 2016-09-21 NOTE — Progress Notes (Signed)
PROGRESS NOTE    [name]  UXL:[id number] DOB: Oct 24, 1927 DOA: 09/20/2016 PCP: Deland Pretty, MD (Confirm with patient/family/NH records and if not entered, this HAS to be entered at [facility] point of entry. "No PCP" if truly none.)   Brief Narrative: (Start on day 1 of progress note - keep it brief and live) 83 y.o. female with medical history significant for CAD, diabetes, hypertension, hyperlipidemia, presenting to the emergency department with left arm and left leg weakness.  She was not a TPA candidate.  MRI notable for 11 mm infarction of R posterior limb of internal capsule and lateral thalamus.  She was admitted for CVA.      Assessment & Plan:   Active Problems:   Diabetes mellitus ([place])   Hyperlipidemia   Essential hypertension   Coronary atherosclerosis   Glaucoma suspect   Nonproliferative diabetic retinopathy ([place])   Status post intraocular lens implant   Stroke (cerebrum) ([place])   Diabetes mellitus with complication (HCC)   Ischemic stroke ([place])  Acute-L sided weakness, dysarthria / CVA  Head CT without acute intracranial pathology.   MRI brain with 11 mm acute/early subacute infarction involving R posterior limb of internal capsule and lateral thalamus (advanced chornic microvascular ischemic changes, volume loss), patent carotid and vertebral ateries and patent circle of willis.  - stroke protocol - A1c 7.7, LDL 101, tchol 167 - echo with grade 1 disastolic dysfunction and EF 55-60% (mild MVR and mild dilation of L atrium) - PT/OT/SLP - receiving plavix/atorvastatin  - Neuro  Following, appreciate involvement  - Care management for possible   inpatient rehab   Type II Diabetes AM BG 146, continue sliding scale insulin  Lab Results  Component Value Date   HGBA1C 7.7 (H) 09/20/2016     Hold Glucotrol  . Hypertension - elevated this AM to systolic 536 Continue home losartan, imdur, and metoprolol  Hydralazine Q6 hours as needed for BP 210/110    Hyperlipidemia Continue home statins  Chronic kidney disease stage 3- 4   Lab Results  Component Value Date   CREATININE 2.00 (H) 09/20/2016   Continue to monitor, last creatinine in our system is from 2013.  May need outpatient follow up.  UA with proteinuria.    Normocytic Anemia: Hemoglobin on admission 10.9 at baseline  Repeat CBC in am    DVT prophylaxis: heparin Code Status: full  Family Communication: no family in room, didn't want me to call anyone Disposition Plan: possibly inpatient rehab   Consultants:   neurology  Procedures: (Don't include imaging studies which can be auto populated. Include things that cannot be auto populated i.e. Echo, Carotid and venous dopplers, Foley, Bipap, HD, tubes/drains, wound vac, central lines etc)  echo  Antimicrobials:   none    Subjective: Feeling better overall.  Walked to bathroom with PT.  Still with L arm weakness.  No CP or SOB.   Objective: Vitals:   09/21/16 0148 09/21/16 0348 09/21/16 0543 09/21/16 0946  BP: (!) 161/60 (!) 157/50 (!) 185/78 (!) 161/61  Pulse: 78 67 83 61  Resp: 18 17 18 17   Temp: 98.7 F (37.1 C) 98.7 F (37.1 C) 98.7 F (37.1 C) 98.6 F (37 C)  TempSrc: Oral Oral Oral Oral  SpO2: 99% 94% 93% 96%  Weight:      Height:        Intake/Output Summary (Last 24 hours) at 09/21/16 1212 Last data filed at 09/21/16 0400  Gross per 24 hour  Intake  990 ml  Output                0 ml  Net              990 ml   Filed Weights   09/21/16 0100  Weight: 76.2 kg (168 lb)    Examination:  General exam: Appears calm and comfortable  Respiratory system: Clear to auscultation. Respiratory effort normal. Cardiovascular system: S1 & S2 heard, RRR. No JVD, murmurs, rubs, gallops or clicks. No pedal edema. Gastrointestinal system: Abdomen is nondistended, soft and nontender. No organomegaly or masses felt. Normal bowel sounds heard. Central nervous system: Alert and oriented. CN 2-12  appear intact with no significant facial droop seen today.  Sensation decreased on the left side.   Extremities: 2/5 strength on left.   Skin: No rashes, lesions or ulcers Psychiatry: Judgement and insight appear normal. Mood & affect appropriate.     Data Reviewed: I have personally reviewed following labs and imaging studies  CBC:  Recent Labs Lab 09/20/16 1406 09/20/16 1436  WBC 7.1  --   NEUTROABS 5.1  --   HGB 10.4* 10.9*  HCT 32.0* 32.0*  MCV 88.6  --   PLT 226  --    Basic Metabolic Panel:  Recent Labs Lab 09/20/16 1406 09/20/16 1436  NA 139 141  K 4.2 4.2  CL 109 109  CO2 22  --   GLUCOSE 205* 204*  BUN 33* 35*  CREATININE 2.04* 2.00*  CALCIUM 10.0  --    GFR: Estimated Creatinine Clearance: 19.7 mL/min (A) (by C-G formula based on SCr of 2 mg/dL (H)). Liver Function Tests:  Recent Labs Lab 09/20/16 1406  AST 17  ALT 14  ALKPHOS 63  BILITOT 0.8  PROT 6.9  ALBUMIN 3.5   No results for input(s): LIPASE, AMYLASE in the last 168 hours. No results for input(s): AMMONIA in the last 168 hours. Coagulation Profile:  Recent Labs Lab 09/20/16 1406  INR 1.01   Cardiac Enzymes: No results for input(s): CKTOTAL, CKMB, CKMBINDEX, TROPONINI in the last 168 hours. BNP (last 3 results) No results for input(s): PROBNP in the last 8760 hours. HbA1C:  Recent Labs  09/20/16 2252  HGBA1C 7.7*   CBG:  Recent Labs Lab 09/20/16 2011 09/20/16 2232 09/21/16 0701  GLUCAP 210* 210* 146*   Lipid Profile:  Recent Labs  09/21/16 0728  CHOL 167  HDL 42  LDLCALC 101*  TRIG 118  CHOLHDL 4.0   Thyroid Function Tests: No results for input(s): TSH, T4TOTAL, FREET4, T3FREE, THYROIDAB in the last 72 hours. Anemia Panel: No results for input(s): VITAMINB12, FOLATE, FERRITIN, TIBC, IRON, RETICCTPCT in the last 72 hours. Sepsis Labs: No results for input(s): PROCALCITON, LATICACIDVEN in the last 168 hours.  No results found for this or any previous visit  (from the past 240 hour(s)).       Radiology Studies: Ct Head Wo Contrast  Result Date: 09/20/2016 CLINICAL DATA:  Left arm and leg weakness since 7 p.m. EXAM: CT HEAD WITHOUT CONTRAST TECHNIQUE: Contiguous axial images were obtained from the base of the skull through the vertex without intravenous contrast. COMPARISON:  05/13/2010 FINDINGS: Brain: No evidence of acute infarction, hemorrhage, extra-axial collection, ventriculomegaly, or mass effect. Generalized cerebral atrophy. Periventricular white matter low attenuation likely secondary to microangiopathy. Vascular: Cerebrovascular atherosclerotic calcifications are noted. Skull: Negative for fracture or focal lesion. Sinuses/Orbits: Visualized portions of the orbits are unremarkable. Visualized portions of the paranasal sinuses and mastoid air  cells are unremarkable. Other: None. IMPRESSION: 1. No acute intracranial pathology. Electronically Signed   By: Kathreen Devoid   On: 09/20/2016 13:43   Mr Jodene Nam Neck Wo Contrast  Result Date: 09/20/2016 CLINICAL DATA:  83 y/o F; left arm and left leg weakness with decreased sensation. EXAM: MRI HEAD WITHOUT CONTRAST MRA HEAD WITHOUT CONTRAST MRA NECK WITHOUT CONTRAST TECHNIQUE: Multiplanar, multiecho pulse sequences of the brain and surrounding structures were obtained without intravenous contrast. Angiographic images of the Circle of Willis were obtained using MRA technique without intravenous contrast. Angiographic images of the neck were obtained using MRA technique without intravenous contrast. Carotid stenosis measurements (when applicable) are obtained utilizing NASCET criteria, using the distal internal carotid diameter as the denominator. COMPARISON:  09/20/2016 CT head FINDINGS: MRI HEAD FINDINGS Brain: 11 x 7 mm focus of reduced diffusion within the right posterior limb of internal capsule and lateral thalamus (series 4, image 24 in series 405, image 24) compatible with acute/ early subacute infarction.  The infarction demonstrates T2 FLAIR hyperintense signal abnormality. There is no evidence for hemorrhage or mass effect. Patchy confluent nonspecific foci of T2 FLAIR hyperintense signal abnormality in subcortical and periventricular white matter is compatible with advanced chronic microvascular ischemic changes for age and there is diffuse brain parenchymal atrophy. Small cortical infarction is present in the right posterior frontal cortex and there is a chronic lacunar infarction within the left thalamus. Vascular: As below. Skull and upper cervical spine: Normal marrow signal. Sinuses/Orbits: No abnormal signal of paranasal sinuses or mastoid air cells. Bilateral intra-ocular lens replacement. Other: None. MRA HEAD FINDINGS Anterior circulation: No large vessel occlusion, aneurysm, or significant stenosis is identified. Mild lumen irregularity of carotid siphons with mild distal left cavernous stenosis is compatible with atherosclerotic changes. Posterior circulation: Codominant vertebrobasilar system. No large vessel occlusion, aneurysm, or significant stenosis is identified. Anatomic variation: Diminutive posterior communicating arteries and large anterior communicating artery are present. MRA NECK FINDINGS Right carotid system: No aneurysm or hemodynamically significant stenosis by NASCET criteria is identified. Left carotid system: No aneurysm or hemodynamically significant stenosis by NASCET criteria is identified. Vertebrobasilar system: No dissection, aneurysm, or significant stenosis is identified. IMPRESSION: 1. 11 mm acute/early subacute infarction involving right posterior limb of internal capsule and lateral thalamus. No acute hemorrhage or mass effect. 2. Advanced chronic microvascular ischemic changes and parenchymal volume loss of the brain. 3. Patent carotid and vertebral arteries. No aneurysm or hemodynamically significant stenosis utilizing NASCET criteria. 4. Patent circle of Willis. No large  vessel occlusion, aneurysm, or significant stenosis. These results will be called to the ordering clinician or representative by the Radiologist Assistant, and communication documented in the PACS or zVision Dashboard. Electronically Signed   By: Kristine Garbe M.D.   On: 09/20/2016 22:43   Mr Brain Wo Contrast  Result Date: 09/20/2016 CLINICAL DATA:  83 y/o F; left arm and left leg weakness with decreased sensation. EXAM: MRI HEAD WITHOUT CONTRAST MRA HEAD WITHOUT CONTRAST MRA NECK WITHOUT CONTRAST TECHNIQUE: Multiplanar, multiecho pulse sequences of the brain and surrounding structures were obtained without intravenous contrast. Angiographic images of the Circle of Willis were obtained using MRA technique without intravenous contrast. Angiographic images of the neck were obtained using MRA technique without intravenous contrast. Carotid stenosis measurements (when applicable) are obtained utilizing NASCET criteria, using the distal internal carotid diameter as the denominator. COMPARISON:  09/20/2016 CT head FINDINGS: MRI HEAD FINDINGS Brain: 11 x 7 mm focus of reduced diffusion within the right posterior limb of  internal capsule and lateral thalamus (series 4, image 24 in series 405, image 24) compatible with acute/ early subacute infarction. The infarction demonstrates T2 FLAIR hyperintense signal abnormality. There is no evidence for hemorrhage or mass effect. Patchy confluent nonspecific foci of T2 FLAIR hyperintense signal abnormality in subcortical and periventricular white matter is compatible with advanced chronic microvascular ischemic changes for age and there is diffuse brain parenchymal atrophy. Small cortical infarction is present in the right posterior frontal cortex and there is a chronic lacunar infarction within the left thalamus. Vascular: As below. Skull and upper cervical spine: Normal marrow signal. Sinuses/Orbits: No abnormal signal of paranasal sinuses or mastoid air cells.  Bilateral intra-ocular lens replacement. Other: None. MRA HEAD FINDINGS Anterior circulation: No large vessel occlusion, aneurysm, or significant stenosis is identified. Mild lumen irregularity of carotid siphons with mild distal left cavernous stenosis is compatible with atherosclerotic changes. Posterior circulation: Codominant vertebrobasilar system. No large vessel occlusion, aneurysm, or significant stenosis is identified. Anatomic variation: Diminutive posterior communicating arteries and large anterior communicating artery are present. MRA NECK FINDINGS Right carotid system: No aneurysm or hemodynamically significant stenosis by NASCET criteria is identified. Left carotid system: No aneurysm or hemodynamically significant stenosis by NASCET criteria is identified. Vertebrobasilar system: No dissection, aneurysm, or significant stenosis is identified. IMPRESSION: 1. 11 mm acute/early subacute infarction involving right posterior limb of internal capsule and lateral thalamus. No acute hemorrhage or mass effect. 2. Advanced chronic microvascular ischemic changes and parenchymal volume loss of the brain. 3. Patent carotid and vertebral arteries. No aneurysm or hemodynamically significant stenosis utilizing NASCET criteria. 4. Patent circle of Willis. No large vessel occlusion, aneurysm, or significant stenosis. These results will be called to the ordering clinician or representative by the Radiologist Assistant, and communication documented in the PACS or zVision Dashboard. Electronically Signed   By: Kristine Garbe M.D.   On: 09/20/2016 22:43   Mr Jodene Nam Head Wo Contrast  Result Date: 09/20/2016 CLINICAL DATA:  83 y/o F; left arm and left leg weakness with decreased sensation. EXAM: MRI HEAD WITHOUT CONTRAST MRA HEAD WITHOUT CONTRAST MRA NECK WITHOUT CONTRAST TECHNIQUE: Multiplanar, multiecho pulse sequences of the brain and surrounding structures were obtained without intravenous contrast.  Angiographic images of the Circle of Willis were obtained using MRA technique without intravenous contrast. Angiographic images of the neck were obtained using MRA technique without intravenous contrast. Carotid stenosis measurements (when applicable) are obtained utilizing NASCET criteria, using the distal internal carotid diameter as the denominator. COMPARISON:  09/20/2016 CT head FINDINGS: MRI HEAD FINDINGS Brain: 11 x 7 mm focus of reduced diffusion within the right posterior limb of internal capsule and lateral thalamus (series 4, image 24 in series 405, image 24) compatible with acute/ early subacute infarction. The infarction demonstrates T2 FLAIR hyperintense signal abnormality. There is no evidence for hemorrhage or mass effect. Patchy confluent nonspecific foci of T2 FLAIR hyperintense signal abnormality in subcortical and periventricular white matter is compatible with advanced chronic microvascular ischemic changes for age and there is diffuse brain parenchymal atrophy. Small cortical infarction is present in the right posterior frontal cortex and there is a chronic lacunar infarction within the left thalamus. Vascular: As below. Skull and upper cervical spine: Normal marrow signal. Sinuses/Orbits: No abnormal signal of paranasal sinuses or mastoid air cells. Bilateral intra-ocular lens replacement. Other: None. MRA HEAD FINDINGS Anterior circulation: No large vessel occlusion, aneurysm, or significant stenosis is identified. Mild lumen irregularity of carotid siphons with mild distal left cavernous stenosis is compatible  with atherosclerotic changes. Posterior circulation: Codominant vertebrobasilar system. No large vessel occlusion, aneurysm, or significant stenosis is identified. Anatomic variation: Diminutive posterior communicating arteries and large anterior communicating artery are present. MRA NECK FINDINGS Right carotid system: No aneurysm or hemodynamically significant stenosis by NASCET  criteria is identified. Left carotid system: No aneurysm or hemodynamically significant stenosis by NASCET criteria is identified. Vertebrobasilar system: No dissection, aneurysm, or significant stenosis is identified. IMPRESSION: 1. 11 mm acute/early subacute infarction involving right posterior limb of internal capsule and lateral thalamus. No acute hemorrhage or mass effect. 2. Advanced chronic microvascular ischemic changes and parenchymal volume loss of the brain. 3. Patent carotid and vertebral arteries. No aneurysm or hemodynamically significant stenosis utilizing NASCET criteria. 4. Patent circle of Willis. No large vessel occlusion, aneurysm, or significant stenosis. These results will be called to the ordering clinician or representative by the Radiologist Assistant, and communication documented in the PACS or zVision Dashboard. Electronically Signed   By: Kristine Garbe M.D.   On: 09/20/2016 22:43        Scheduled Meds: .  stroke: mapping our early stages of recovery book   Does not apply Once  . amLODipine  10 mg Oral Daily  . atorvastatin  40 mg Oral q1800  . cholecalciferol  400 Units Oral Daily  . clopidogrel  75 mg Oral Daily  . heparin  5,000 Units Subcutaneous Q8H  . insulin aspart  0-9 Units Subcutaneous TID WC  . isosorbide mononitrate  60 mg Oral Daily  . losartan  50 mg Oral BID  . metoprolol tartrate  50 mg Oral Daily   Continuous Infusions: . sodium chloride 75 mL/hr at 09/21/16 0716     LOS: 1 day    Time spent: [place], MD [facility] Pager [phone]  If 7PM-7AM, please contact night-coverage [email or web address] Password [email or web address] 09/21/2016, 12:12 PM

## 2016-09-21 NOTE — Progress Notes (Signed)
  Echocardiogram 2D Echocardiogram has been performed.  [name] 09/21/2016, 9:56 AM

## 2016-09-21 NOTE — Progress Notes (Signed)
OT Cancellation Note  Patient Details Name: [name] MRN: [id number] DOB: [birth year]   Cancelled Treatment:    Reason Eval/Treat Not Completed: Patient at procedure or test/ unavailable. ECHO lab  [place], OT/L  [phone] 09/21/2016 09/21/2016, 9:10 AM

## 2016-09-21 NOTE — Consult Note (Signed)
Physical Medicine and Rehabilitation Consult Reason for Consult: Left side weakness Referring Physician: [facility]   HPI: [name] is a 83 y.o. right handed female with history of CAD/CABG maintained on aspirin., diabetes mellitus, hypertension, CKD stage III. Per chart review patient lives with daughter. Independent prior to admission. She does not drive. Daughter works during the day. Presented 09/20/2016 with left-sided weakness. Cranial CT scan negative. Patient did not receive TPA. MRI of the brain showed 11 mm acute early subacute infarction involving right posterior limb of internal capsule and lateral thalamus. MRA with no large vessel occlusion or aneurysm. Echocardiogram with ejection fraction of 40% grade 1 diastolic dysfunction. Carotid Doppler is pending. Currently maintained on Plavix for CVA prophylaxis. Subcutaneous heparin for DVT prophylaxis. Physical and occupational therapy evaluation pending. M.D. has requested physical medicine rehabilitation consult  Patient states that she has no numbness on the left side, no swallowing dysfunction, has not had a bowel movement since admission, but has used the toilet for bladder needs. Got up with physical therapy two-person assist, but according the patient, they were not hanging onto her gait belt.   Review of Systems  Constitutional: Negative for chills and fever.  HENT: Negative for hearing loss.   Eyes: Negative for blurred vision and double vision.  Respiratory: Negative for cough and shortness of breath.        Occasional shortness of breath with exertion  Cardiovascular: Positive for leg swelling. Negative for chest pain and palpitations.  Gastrointestinal: Positive for constipation. Negative for nausea and vomiting.  Genitourinary: Negative for dysuria, hematuria and urgency.  Musculoskeletal: Positive for joint pain and myalgias.  Skin: Negative for rash.  Neurological: Positive for sensory change and weakness.  Negative for seizures.  All other systems reviewed and are negative.  Past Medical History:  Diagnosis Date  . Acute renal injury ([place]) 07/2016  . Anginal pain ([place]) 2004; 2011  . CAD (coronary artery disease)    status post stenting of the RCA status post CABG. (left internal mammary artery to  LAD, saphenous vein graft to second diagonal, saphenous vein  graft to obtuse marginal 1, saphenous vein graft to posterior  descending).  . Cardiomyopathy     Mildly reduced EF (45%).   . Chronic kidney disease (CKD), stage III (moderate)   . History of blood transfusion 1960s   "related to anemia"  . HTN (hypertension)   . Myocardial infarction ([place]) 2011  . Pleural effusion 2011   S/P "bypass"  . Shortness of breath   . Stroke ([place]) 09/19/2016   "left sided weakness" (09/20/2016)  . Type II diabetes mellitus ([place])    Past Surgical History:  Procedure Laterality Date  . CATARACT EXTRACTION W/ INTRAOCULAR LENS  IMPLANT, BILATERAL Bilateral   . CORONARY ANGIOPLASTY WITH STENT PLACEMENT  2004   "LAD & one of the circumflex"  . CORONARY ARTERY BYPASS GRAFT  2011   CABG X4   History reviewed. No pertinent family history. Social History:  reports that she has never smoked. She has never used smokeless tobacco. She reports that she does not drink alcohol or use drugs. Allergies:  Allergies  Allergen Reactions  . Lisinopril Swelling   Medications Prior to Admission  Medication Sig Dispense Refill  . amLODipine (NORVASC) 10 MG tablet Take 1 tablet (10 mg total) by mouth daily. 90 tablet 3  . aspirin 81 MG tablet Take 81 mg by mouth daily.     . cholecalciferol (VITAMIN D) 400 units  TABS tablet Take 400 Units by mouth.    Marland Kitchen glipiZIDE (GLUCOTROL XL) 5 MG 24 hr tablet Take 5 mg by mouth daily.    . isosorbide mononitrate (IMDUR) 60 MG 24 hr tablet TAKE ONE TABLET BY MOUTH DAILY. 30 tablet 6  . losartan (COZAAR) 50 MG tablet Take 1 tablet (50 mg total) by mouth 2 (two) times daily. 60 tablet  9  . metoprolol (LOPRESSOR) 50 MG tablet TAKE ONE TABLET BY MOUTH TWICE DAILY (Patient taking differently: TAKE ONE TABLET BY MOUTH DAILY) 180 tablet 2    Home: Home Living Family/patient expects to be discharged to:: Private residence Living Arrangements: Children Available Help at Discharge: Available PRN/intermittently Type of Home: House Home Access: Stairs to enter Technical brewer of Steps: 2 Entrance Stairs-Rails: None Home Layout: Multi-level Alternate Level Stairs-Number of Steps: 5 landing 5 Alternate Level Stairs-Rails: Right, Left, Can reach both Bathroom Shower/Tub: Tub/shower unit, Multimedia programmer: Standard Bathroom Accessibility: Yes Home Equipment: Environmental consultant - 2 wheels, Shower seat - built in, [employer] - tub/shower  Lives With: Daughter  Functional History: Prior Function Level of Independence: Independent Comments: Pt reports that she performed her ADLs and simple cooking tasks at home PTA. Pt states that she has a lady who comes in to clean and her daughter assists with IADLs as needed. Functional Status:  Mobility:          ADL: ADL Overall ADL's : Needs assistance/impaired  Cognition: Cognition Overall Cognitive Status: Within Functional Limits for tasks assessed Arousal/Alertness: Awake/alert Orientation Level: Oriented X4 Attention: Focused, Sustained Focused Attention: Appears intact Sustained Attention: Appears intact Memory: Appears intact Awareness: Appears intact Problem Solving: Appears intact Executive Function: Self Monitoring, Organizing Organizing: Appears intact Self Monitoring: Appears intact Safety/Judgment: Appears intact Cognition Overall Cognitive Status: Within Functional Limits for tasks assessed  Blood pressure (!) 161/61, pulse 61, temperature 98.6 F (37 C), temperature source Oral, resp. rate 17, height 5\' 3"  (1.6 m), weight 76.2 kg (168 lb), SpO2 96 %. Physical Exam  Vitals  reviewed. Constitutional: She is oriented to person, place, and time. She appears well-developed.  HENT:  Head: Normocephalic.  Eyes: EOM are normal.  Neck: Normal range of motion. Neck supple. No thyromegaly present.  Cardiovascular: Normal rate, regular rhythm and normal heart sounds.   Respiratory: Effort normal and breath sounds normal. No respiratory distress.  GI: Soft. Bowel sounds are normal. She exhibits no distension.  Neurological: She is alert and oriented to person, place, and time.  Follows commands. Fair awareness of deficits.  Skin: Skin is warm and dry.    Results for orders placed or performed during the hospital encounter of 09/20/16 (from the past 24 hour(s))  Ethanol     Status: None   Collection Time: 09/20/16  2:06 PM  Result Value Ref Range   Alcohol, Ethyl (B) <5 <5 mg/dL  Protime-INR     Status: None   Collection Time: 09/20/16  2:06 PM  Result Value Ref Range   Prothrombin Time 13.3 11.4 - 15.2 seconds   INR 1.01   APTT     Status: None   Collection Time: 09/20/16  2:06 PM  Result Value Ref Range   aPTT 29 24 - 36 seconds  CBC     Status: Abnormal   Collection Time: 09/20/16  2:06 PM  Result Value Ref Range   WBC 7.1 4.0 - 10.5 K/uL   RBC 3.61 (L) 3.87 - 5.11 MIL/uL   Hemoglobin 10.4 (L) 12.0 -  15.0 g/dL   HCT 32.0 (L) 36.0 - 46.0 %   MCV 88.6 78.0 - 100.0 fL   MCH 28.8 26.0 - 34.0 pg   MCHC 32.5 30.0 - 36.0 g/dL   RDW 13.8 11.5 - 15.5 %   Platelets 226 150 - 400 K/uL  Differential     Status: None   Collection Time: 09/20/16  2:06 PM  Result Value Ref Range   Neutrophils Relative % 73 %   Neutro Abs 5.1 1.7 - 7.7 K/uL   Lymphocytes Relative 17 %   Lymphs Abs 1.2 0.7 - 4.0 K/uL   Monocytes Relative 9 %   Monocytes Absolute 0.6 0.1 - 1.0 K/uL   Eosinophils Relative 1 %   Eosinophils Absolute 0.1 0.0 - 0.7 K/uL   Basophils Relative 0 %   Basophils Absolute 0.0 0.0 - 0.1 K/uL  Comprehensive metabolic panel     Status: Abnormal   Collection  Time: 09/20/16  2:06 PM  Result Value Ref Range   Sodium 139 135 - 145 mmol/L   Potassium 4.2 3.5 - 5.1 mmol/L   Chloride 109 101 - 111 mmol/L   CO2 22 22 - 32 mmol/L   Glucose, Bld 205 (H) 65 - 99 mg/dL   BUN 33 (H) 6 - 20 mg/dL   Creatinine, Ser 2.04 (H) 0.44 - 1.00 mg/dL   Calcium 10.0 8.9 - 10.3 mg/dL   Total Protein 6.9 6.5 - 8.1 g/dL   Albumin 3.5 3.5 - 5.0 g/dL   AST 17 15 - 41 U/L   ALT 14 14 - 54 U/L   Alkaline Phosphatase 63 38 - 126 U/L   Total Bilirubin 0.8 0.3 - 1.2 mg/dL   GFR calc non Af Amer 21 (L) >60 mL/min   GFR calc Af Amer 24 (L) >60 mL/min   Anion gap 8 5 - 15  I-stat troponin, ED (not at [facility], [facility])     Status: None   Collection Time: 09/20/16  2:35 PM  Result Value Ref Range   Troponin i, poc 0.00 0.00 - 0.08 ng/mL   Comment 3          I-Stat Chem 8, ED  (not at [facility], [facility])     Status: Abnormal   Collection Time: 09/20/16  2:36 PM  Result Value Ref Range   Sodium 141 135 - 145 mmol/L   Potassium 4.2 3.5 - 5.1 mmol/L   Chloride 109 101 - 111 mmol/L   BUN 35 (H) 6 - 20 mg/dL   Creatinine, Ser 2.00 (H) 0.44 - 1.00 mg/dL   Glucose, Bld 204 (H) 65 - 99 mg/dL   Calcium, Ion 1.23 1.15 - 1.40 mmol/L   TCO2 23 0 - 100 mmol/L   Hemoglobin 10.9 (L) 12.0 - 15.0 g/dL   HCT 32.0 (L) 36.0 - 46.0 %  Urine rapid drug screen (hosp performed)not at [facility]     Status: None   Collection Time: 09/20/16  3:30 PM  Result Value Ref Range   Opiates NONE DETECTED NONE DETECTED   Cocaine NONE DETECTED NONE DETECTED   Benzodiazepines NONE DETECTED NONE DETECTED   Amphetamines NONE DETECTED NONE DETECTED   Tetrahydrocannabinol NONE DETECTED NONE DETECTED   Barbiturates NONE DETECTED NONE DETECTED  Urinalysis, Routine w reflex microscopic     Status: Abnormal   Collection Time: 09/20/16  3:30 PM  Result Value Ref Range   Color, Urine YELLOW YELLOW   APPearance CLEAR CLEAR   Specific Gravity, Urine 1.010 1.005 -  1.030   pH 6.0 5.0 - 8.0   Glucose, UA 50 (A) NEGATIVE mg/dL    Hgb urine dipstick SMALL (A) NEGATIVE   Bilirubin Urine NEGATIVE NEGATIVE   Ketones, ur NEGATIVE NEGATIVE mg/dL   Protein, ur 100 (A) NEGATIVE mg/dL   Nitrite NEGATIVE NEGATIVE   Leukocytes, UA NEGATIVE NEGATIVE   RBC / HPF 0-5 0 - 5 RBC/hpf   WBC, UA 0-5 0 - 5 WBC/hpf   Bacteria, UA NONE SEEN NONE SEEN   Squamous Epithelial / LPF 0-5 (A) NONE SEEN  Glucose, capillary     Status: Abnormal   Collection Time: 09/20/16  8:11 PM  Result Value Ref Range   Glucose-Capillary 210 (H) 65 - 99 mg/dL   Comment 1 Notify RN    Comment 2 Document in Chart   Glucose, capillary     Status: Abnormal   Collection Time: 09/20/16 10:32 PM  Result Value Ref Range   Glucose-Capillary 210 (H) 65 - 99 mg/dL   Comment 1 Notify RN    Comment 2 Document in Chart   Hemoglobin A1c     Status: Abnormal   Collection Time: 09/20/16 10:52 PM  Result Value Ref Range   Hgb A1c MFr Bld 7.7 (H) 4.8 - 5.6 %   Mean Plasma Glucose 174.29 mg/dL  APTT     Status: None   Collection Time: 09/20/16 10:52 PM  Result Value Ref Range   aPTT 30 24 - 36 seconds  Glucose, capillary     Status: Abnormal   Collection Time: 09/21/16  7:01 AM  Result Value Ref Range   Glucose-Capillary 146 (H) 65 - 99 mg/dL   Comment 1 Notify RN    Comment 2 Document in Chart   Lipid panel     Status: Abnormal   Collection Time: 09/21/16  7:28 AM  Result Value Ref Range   Cholesterol 167 0 - 200 mg/dL   Triglycerides 118 <150 mg/dL   HDL 42 >40 mg/dL   Total CHOL/HDL Ratio 4.0 RATIO   VLDL 24 0 - 40 mg/dL   LDL Cholesterol 101 (H) 0 - 99 mg/dL   Ct Head Wo Contrast  Result Date: 09/20/2016 CLINICAL DATA:  Left arm and leg weakness since 7 p.m. EXAM: CT HEAD WITHOUT CONTRAST TECHNIQUE: Contiguous axial images were obtained from the base of the skull through the vertex without intravenous contrast. COMPARISON:  05/13/2010 FINDINGS: Brain: No evidence of acute infarction, hemorrhage, extra-axial collection, ventriculomegaly, or mass effect.  Generalized cerebral atrophy. Periventricular white matter low attenuation likely secondary to microangiopathy. Vascular: Cerebrovascular atherosclerotic calcifications are noted. Skull: Negative for fracture or focal lesion. Sinuses/Orbits: Visualized portions of the orbits are unremarkable. Visualized portions of the paranasal sinuses and mastoid air cells are unremarkable. Other: None. IMPRESSION: 1. No acute intracranial pathology. Electronically Signed   By: Kathreen Devoid   On: 09/20/2016 13:43   Mr Jodene Nam Neck Wo Contrast  Result Date: 09/20/2016 CLINICAL DATA:  83 y/o F; left arm and left leg weakness with decreased sensation. EXAM: MRI HEAD WITHOUT CONTRAST MRA HEAD WITHOUT CONTRAST MRA NECK WITHOUT CONTRAST TECHNIQUE: Multiplanar, multiecho pulse sequences of the brain and surrounding structures were obtained without intravenous contrast. Angiographic images of the Circle of Willis were obtained using MRA technique without intravenous contrast. Angiographic images of the neck were obtained using MRA technique without intravenous contrast. Carotid stenosis measurements (when applicable) are obtained utilizing NASCET criteria, using the distal internal carotid diameter as the denominator. COMPARISON:  09/20/2016 CT  head FINDINGS: MRI HEAD FINDINGS Brain: 11 x 7 mm focus of reduced diffusion within the right posterior limb of internal capsule and lateral thalamus (series 4, image 24 in series 405, image 24) compatible with acute/ early subacute infarction. The infarction demonstrates T2 FLAIR hyperintense signal abnormality. There is no evidence for hemorrhage or mass effect. Patchy confluent nonspecific foci of T2 FLAIR hyperintense signal abnormality in subcortical and periventricular white matter is compatible with advanced chronic microvascular ischemic changes for age and there is diffuse brain parenchymal atrophy. Small cortical infarction is present in the right posterior frontal cortex and there is a  chronic lacunar infarction within the left thalamus. Vascular: As below. Skull and upper cervical spine: Normal marrow signal. Sinuses/Orbits: No abnormal signal of paranasal sinuses or mastoid air cells. Bilateral intra-ocular lens replacement. Other: None. MRA HEAD FINDINGS Anterior circulation: No large vessel occlusion, aneurysm, or significant stenosis is identified. Mild lumen irregularity of carotid siphons with mild distal left cavernous stenosis is compatible with atherosclerotic changes. Posterior circulation: Codominant vertebrobasilar system. No large vessel occlusion, aneurysm, or significant stenosis is identified. Anatomic variation: Diminutive posterior communicating arteries and large anterior communicating artery are present. MRA NECK FINDINGS Right carotid system: No aneurysm or hemodynamically significant stenosis by NASCET criteria is identified. Left carotid system: No aneurysm or hemodynamically significant stenosis by NASCET criteria is identified. Vertebrobasilar system: No dissection, aneurysm, or significant stenosis is identified. IMPRESSION: 1. 11 mm acute/early subacute infarction involving right posterior limb of internal capsule and lateral thalamus. No acute hemorrhage or mass effect. 2. Advanced chronic microvascular ischemic changes and parenchymal volume loss of the brain. 3. Patent carotid and vertebral arteries. No aneurysm or hemodynamically significant stenosis utilizing NASCET criteria. 4. Patent circle of Willis. No large vessel occlusion, aneurysm, or significant stenosis. These results will be called to the ordering clinician or representative by the Radiologist Assistant, and communication documented in the PACS or zVision Dashboard. Electronically Signed   By: Kristine Garbe M.D.   On: 09/20/2016 22:43   Mr Brain Wo Contrast  Result Date: 09/20/2016 CLINICAL DATA:  83 y/o F; left arm and left leg weakness with decreased sensation. EXAM: MRI HEAD WITHOUT  CONTRAST MRA HEAD WITHOUT CONTRAST MRA NECK WITHOUT CONTRAST TECHNIQUE: Multiplanar, multiecho pulse sequences of the brain and surrounding structures were obtained without intravenous contrast. Angiographic images of the Circle of Willis were obtained using MRA technique without intravenous contrast. Angiographic images of the neck were obtained using MRA technique without intravenous contrast. Carotid stenosis measurements (when applicable) are obtained utilizing NASCET criteria, using the distal internal carotid diameter as the denominator. COMPARISON:  09/20/2016 CT head FINDINGS: MRI HEAD FINDINGS Brain: 11 x 7 mm focus of reduced diffusion within the right posterior limb of internal capsule and lateral thalamus (series 4, image 24 in series 405, image 24) compatible with acute/ early subacute infarction. The infarction demonstrates T2 FLAIR hyperintense signal abnormality. There is no evidence for hemorrhage or mass effect. Patchy confluent nonspecific foci of T2 FLAIR hyperintense signal abnormality in subcortical and periventricular white matter is compatible with advanced chronic microvascular ischemic changes for age and there is diffuse brain parenchymal atrophy. Small cortical infarction is present in the right posterior frontal cortex and there is a chronic lacunar infarction within the left thalamus. Vascular: As below. Skull and upper cervical spine: Normal marrow signal. Sinuses/Orbits: No abnormal signal of paranasal sinuses or mastoid air cells. Bilateral intra-ocular lens replacement. Other: None. MRA HEAD FINDINGS Anterior circulation: No large vessel occlusion, aneurysm,  or significant stenosis is identified. Mild lumen irregularity of carotid siphons with mild distal left cavernous stenosis is compatible with atherosclerotic changes. Posterior circulation: Codominant vertebrobasilar system. No large vessel occlusion, aneurysm, or significant stenosis is identified. Anatomic variation:  Diminutive posterior communicating arteries and large anterior communicating artery are present. MRA NECK FINDINGS Right carotid system: No aneurysm or hemodynamically significant stenosis by NASCET criteria is identified. Left carotid system: No aneurysm or hemodynamically significant stenosis by NASCET criteria is identified. Vertebrobasilar system: No dissection, aneurysm, or significant stenosis is identified. IMPRESSION: 1. 11 mm acute/early subacute infarction involving right posterior limb of internal capsule and lateral thalamus. No acute hemorrhage or mass effect. 2. Advanced chronic microvascular ischemic changes and parenchymal volume loss of the brain. 3. Patent carotid and vertebral arteries. No aneurysm or hemodynamically significant stenosis utilizing NASCET criteria. 4. Patent circle of Willis. No large vessel occlusion, aneurysm, or significant stenosis. These results will be called to the ordering clinician or representative by the Radiologist Assistant, and communication documented in the PACS or zVision Dashboard. Electronically Signed   By: Kristine Garbe M.D.   On: 09/20/2016 22:43   Mr Jodene Nam Head Wo Contrast  Result Date: 09/20/2016 CLINICAL DATA:  83 y/o F; left arm and left leg weakness with decreased sensation. EXAM: MRI HEAD WITHOUT CONTRAST MRA HEAD WITHOUT CONTRAST MRA NECK WITHOUT CONTRAST TECHNIQUE: Multiplanar, multiecho pulse sequences of the brain and surrounding structures were obtained without intravenous contrast. Angiographic images of the Circle of Willis were obtained using MRA technique without intravenous contrast. Angiographic images of the neck were obtained using MRA technique without intravenous contrast. Carotid stenosis measurements (when applicable) are obtained utilizing NASCET criteria, using the distal internal carotid diameter as the denominator. COMPARISON:  09/20/2016 CT head FINDINGS: MRI HEAD FINDINGS Brain: 11 x 7 mm focus of reduced diffusion  within the right posterior limb of internal capsule and lateral thalamus (series 4, image 24 in series 405, image 24) compatible with acute/ early subacute infarction. The infarction demonstrates T2 FLAIR hyperintense signal abnormality. There is no evidence for hemorrhage or mass effect. Patchy confluent nonspecific foci of T2 FLAIR hyperintense signal abnormality in subcortical and periventricular white matter is compatible with advanced chronic microvascular ischemic changes for age and there is diffuse brain parenchymal atrophy. Small cortical infarction is present in the right posterior frontal cortex and there is a chronic lacunar infarction within the left thalamus. Vascular: As below. Skull and upper cervical spine: Normal marrow signal. Sinuses/Orbits: No abnormal signal of paranasal sinuses or mastoid air cells. Bilateral intra-ocular lens replacement. Other: None. MRA HEAD FINDINGS Anterior circulation: No large vessel occlusion, aneurysm, or significant stenosis is identified. Mild lumen irregularity of carotid siphons with mild distal left cavernous stenosis is compatible with atherosclerotic changes. Posterior circulation: Codominant vertebrobasilar system. No large vessel occlusion, aneurysm, or significant stenosis is identified. Anatomic variation: Diminutive posterior communicating arteries and large anterior communicating artery are present. MRA NECK FINDINGS Right carotid system: No aneurysm or hemodynamically significant stenosis by NASCET criteria is identified. Left carotid system: No aneurysm or hemodynamically significant stenosis by NASCET criteria is identified. Vertebrobasilar system: No dissection, aneurysm, or significant stenosis is identified. IMPRESSION: 1. 11 mm acute/early subacute infarction involving right posterior limb of internal capsule and lateral thalamus. No acute hemorrhage or mass effect. 2. Advanced chronic microvascular ischemic changes and parenchymal volume loss of  the brain. 3. Patent carotid and vertebral arteries. No aneurysm or hemodynamically significant stenosis utilizing NASCET criteria. 4. Patent circle of Willis. No  large vessel occlusion, aneurysm, or significant stenosis. These results will be called to the ordering clinician or representative by the Radiologist Assistant, and communication documented in the PACS or zVision Dashboard. Electronically Signed   By: Kristine Garbe M.D.   On: 09/20/2016 22:43    Assessment/Plan: Diagnosis: Right PLIC infarct causing left hemiparesis. 1. Does the need for close, 24 hr/day medical supervision in concert with the patient's rehab needs make it unreasonable for this patient to be served in a less intensive setting? Yes 2. Co-Morbidities requiring supervision/potential complications: History of coronary artery disease, CABG, diabetes, hypertension, CKD 3 3. Due to bladder management, bowel management, safety, skin/wound care, disease management, medication administration, pain management and patient education, does the patient require 24 hr/day rehab nursing? Yes 4. Does the patient require coordinated care of a physician, rehab nurse, PT (1-2 hrs/day, 5 days/week) and OT (1-2 hrs/day, 5 days/week) to address physical and functional deficits in the context of the above medical diagnosis(es)? Yes Addressing deficits in the following areas: balance, endurance, locomotion, strength, transferring, bowel/bladder control, bathing, dressing, feeding, grooming, toileting and psychosocial support 5. Can the patient actively participate in an intensive therapy program of at least 3 hrs of therapy per day at least 5 days per week? No 6. The potential for patient to make measurable gains while on inpatient rehab is excellent 7. Anticipated functional outcomes upon discharge from inpatient rehab are modified independent and supervision  with PT, modified independent and supervision with OT, n/a with SLP. 8. Estimated  rehab length of stay to reach the above functional goals is: 7-10d 9. Anticipated D/C setting: Home 10. Anticipated post D/C treatments: [place] therapy 11. Overall Rehab/Functional Prognosis: excellent  RECOMMENDATIONS: This patient's condition is appropriate for continued rehabilitative care in the following setting: CIR Patient has agreed to participate in recommended program. Yes Note that insurance prior authorization may be required for reimbursement for recommended care.  CommentCathlyn Parsons., PA-C 09/21/2016

## 2016-09-21 NOTE — Evaluation (Addendum)
Physical Therapy Evaluation Patient Details Name: [name] MRN: [id number] DOB: [birth year] Today's Date: 09/21/2016   History of Present Illness  83 y.o. female admitted on 09/20/16 with left sided weakness.  Pt's MRI revealed 52mm acute/early subacute infarction R posterior limb of internal capsule and lateral thalamus.  Pt with significant PMH of DM2, SOB, MI, HTN, blood transfusion, CKD III, cardiomyopathy, CAD, and CABG.  Clinical Impression  Pt was able to walk a short distance in her room with RW and mod assist.  She would benefit from post acute intensive therapy prior to return home.  She was independent PTA.   PT to follow acutely for deficits listed below.       Follow Up Recommendations CIR    Equipment Recommendations  Rolling walker with 5" wheels    Recommendations for Other Services Rehab consult     Precautions / Restrictions Precautions Precautions: Fall      Mobility  Bed Mobility               General bed mobility comments: Pt is OOB in the recliner chair.  Transfers Overall transfer level: Needs assistance Equipment used: 2 person hand held assist;Rolling walker (2 wheeled) Transfers: Sit to/from Stand Sit to Stand: Min assist         General transfer comment: Min asisst to support trunk and stabilize RW during transitions.  Verbal cues for safe hand placement and manual assist at left arm/hand to keep WB throught the chair armrest during transitions.   Ambulation/Gait Ambulation/Gait assistance: Mod assist Ambulation Distance (Feet): 15 Feet (x2) Assistive device: Rolling walker (2 wheeled) Gait Pattern/deviations: Step-through pattern;Decreased stance time - left;Decreased dorsiflexion - left;Decreased step length - left     General Gait Details: Pt with left sided steppage gait pattern with mod assist needed to help control left knee and support trunk and steer RW during gait.  Pt needed light manual assist to terminally extend her left  knee in stance so that she could safely lift right leg and WB fully on left leg without it buckling.        Modified Rankin (Stroke Patients Only) Modified Rankin (Stroke Patients Only) Pre-Morbid Rankin Score: No symptoms Modified Rankin: Moderately severe disability     Balance Overall balance assessment: Needs assistance Sitting-balance support: No upper extremity supported;Feet supported Sitting balance-Leahy Scale: Fair     Standing balance support: Bilateral upper extremity supported;During functional activity Standing balance-Leahy Scale: Poor Standing balance comment: Required therapist and RW for support.                              Pertinent Vitals/Pain Pain Assessment: No/denies pain    Home Living Family/patient expects to be discharged to:: Private residence Living Arrangements: Children Available Help at Discharge: Available PRN/intermittently Type of Home: House Home Access: Stairs to enter Entrance Stairs-Rails: None Entrance Stairs-Number of Steps: 2 Home Layout: Multi-level Home Equipment: Environmental consultant - 2 wheels;Shower seat - built in;Grab bars - tub/shower      Prior Function Level of Independence: Independent         Comments: Pt reports that she performed her ADLs and cooking tasks at home PTA. Pt states that she has a lady who comes in to clean and her daughter assists with IADLs as needed.     Hand Dominance   Dominant Hand: Right    Extremity/Trunk Assessment   Upper Extremity Assessment Upper Extremity Assessment: Defer to OT evaluation  Lower Extremity Assessment Lower Extremity Assessment: LLE deficits/detail LLE Deficits / Details: left leg with 2/5 DF, 3-/5 knee extension, 2/5 hip flexion per gross seated MMT.  LLE Sensation: decreased light touch    Cervical / Trunk Assessment Cervical / Trunk Assessment: Other exceptions Cervical / Trunk Exceptions: L sided weakness and tendency for R lateral lean in sitting   Communication   Communication: No difficulties  Cognition Arousal/Alertness: Awake/alert Behavior During Therapy: WFL for tasks assessed/performed Overall Cognitive Status: No family/caregiver present to determine baseline cognitive functioning                                 General Comments: Noted some decreased problem solving and requiring increased time for tasks. Pt unable to talk while performing movements. Family not present to determine cogntitive baseline funciton (per OT note).             Assessment/Plan    PT Assessment Patient needs continued PT services  PT Problem List Decreased strength;Decreased activity tolerance;Decreased balance;Decreased mobility;Decreased coordination;Decreased cognition;Decreased knowledge of use of DME;Decreased safety awareness;Decreased knowledge of precautions;Impaired sensation       PT Treatment Interventions DME instruction;Gait training;Stair training;Functional mobility training;Therapeutic activities;Therapeutic exercise;Neuromuscular re-education;Balance training;Cognitive remediation;Patient/family education    PT Goals (Current goals can be found in the Care Plan section)  Acute Rehab PT Goals Patient Stated Goal: "I want to be myself again" PT Goal Formulation: With patient Time For Goal Achievement: 10/05/16 Potential to Achieve Goals: Good    Frequency Min 4X/week           AM-PAC PT "6 Clicks" Daily Activity  Outcome Measure Difficulty turning over in bed (including adjusting bedclothes, sheets and blankets)?: Unable Difficulty moving from lying on back to sitting on the side of the bed? : Unable Difficulty sitting down on and standing up from a chair with arms (e.g., wheelchair, bedside commode, etc,.)?: Unable Help needed moving to and from a bed to chair (including a wheelchair)?: A Lot Help needed walking in hospital room?: A Lot Help needed climbing 3-5 steps with a railing? : A Lot 6 Click  Score: 9    End of Session Equipment Utilized During Treatment: Gait belt Activity Tolerance: Patient limited by fatigue Patient left: in chair;with call bell/phone within reach;with chair alarm set   PT Visit Diagnosis: Muscle weakness (generalized) (M62.81);Difficulty in walking, not elsewhere classified (R26.2);Hemiplegia and hemiparesis;Other symptoms and signs involving the nervous system (R29.898) Hemiplegia - Right/Left: Left Hemiplegia - dominant/non-dominant: Non-dominant Hemiplegia - caused by: Cerebral infarction    Time: 1137-1204 PT Time Calculation (min) (ACUTE ONLY): 27 min   Charges:          Wells Guiles B. Rimas Gilham, PT, DPT [phone]   PT Evaluation $PT Eval Moderate Complexity: 1 Mod PT Treatments $Gait Training: 8-22 mins   09/21/2016, 11:57 PM

## 2016-09-21 NOTE — Evaluation (Signed)
Occupational Therapy Evaluation Patient Details Name: [name] MRN: [id number] DOB: 1927-12-23 Today's Date: 09/21/2016    History of Present Illness 83 y.o. female admitted on 09/20/16 with left sided weakness.  Pt's MRI revealed    Clinical Impression   PTA, pt lived with her daughter and was independent in ADLs and simple IADLs. Pt currently requiring Mod A for UB ADLs and Max A for LB ADLs. Pt presenting with decreased strength, coordination, and sensation of LUE impacting her performance of ADLs and functional mobility. Pt very motivated to return to PLOF and reports that she has a supportive family. Pt would benefit from further acute OT to facilitate safe dc and increase occupational performance. Recommend dc to CIR for intensive therapy to optimize pt safety and independence with ADLs and functional mobility.     Follow Up Recommendations  CIR;Supervision/Assistance - 24 hour    Equipment Recommendations  Other (comment) (Defer to next venue)    Recommendations for Other Services PT consult;Rehab consult     Precautions / Restrictions Precautions Precautions: Fall      Mobility Bed Mobility Overal bed mobility: Needs Assistance Bed Mobility: Supine to Sit;Sit to Supine     Supine to sit: Min guard;HOB elevated Sit to supine: Min guard;HOB elevated   General bed mobility comments: Min Guard for safety. Pt able to pull up using bed rails and HOB elevated  Transfers Overall transfer level: Needs assistance Equipment used: 2 person hand held assist Transfers: Sit to/from [facility] Sit to Stand: Mod assist Stand pivot transfers: Mod assist       General transfer comment: Mod A to steady in standing. Pt verbalizing fear of falling during transfers    Balance Overall balance assessment: Needs assistance Sitting-balance support: No upper extremity supported;Feet supported Sitting balance-Leahy Scale: Fair     Standing balance support:  Bilateral upper extremity supported;During functional activity Standing balance-Leahy Scale: Poor Standing balance comment: Required physical A to maintain standing                           ADL either performed or assessed with clinical judgement   ADL Overall ADL's : Needs assistance/impaired Eating/Feeding: Minimal assistance;Sitting Eating/Feeding Details (indicate cue type and reason): Pt reports dropping cups and utensils with LUE Grooming: Min guard;Sitting   Upper Body Bathing: Moderate assistance;Sitting   Lower Body Bathing: Maximal assistance;Sit to/from stand   Upper Body Dressing : Moderate assistance   Lower Body Dressing: Maximal assistance;Sit to/from stand Lower Body Dressing Details (indicate cue type and reason): Pt requiring Max A to maitnain standing balance. Pt able to reach down and adjust socks while seated with Min Guard for safety.  Toilet Transfer: Moderate assistance;Stand-pivot (Simulated to recliner) Toilet Transfer Details (indicate cue type and reason): Performed stand pivot transfer to recliner. Pt requiring Mod A to maintain standing balance and turn                 Vision Baseline Vision/History: Wears glasses Wears Glasses: At all times Patient Visual Report: Other (comment) (Pt without glasses)       Perception     Praxis      Pertinent Vitals/Pain Pain Assessment: No/denies pain     Hand Dominance Right   Extremity/Trunk Assessment Upper Extremity Assessment Upper Extremity Assessment: LUE deficits/detail LUE Deficits / Details: Pt unable to bring hand to mouth and uses compensatory shoulder hiking with shoulder flexion. Demonstrates decreased FM, composite grasp, and coordination of  LUE. Pt unable to grasp lotion bottle in L hand to open; instead places bottle between her forearm and trunk to hold while R hand unscrews cap. Pt able to perform finger isolated opposition of L hand slowly. Pt attempting to use LUE for  gross motor such as bending down to pull up socks or pushing off bed into standing.   Lower Extremity Assessment Lower Extremity Assessment: Defer to PT evaluation   Cervical / Trunk Assessment Cervical / Trunk Assessment: Other exceptions Cervical/Trunk Exceptions: L sided weakness and tendency for R lateral lean in sitting   Communication     Cognition Arousal/Alertness: Awake/alert Behavior During Therapy: WFL for tasks assessed/performed Overall Cognitive Status: No family/caregiver present to determine baseline cognitive functioning                                 General Comments: Noted some decreased problem solving and requiring increased time for tasks. Pt unable to talk while performing movements. Family not present to determine cogntitive baseline funciton   General Comments       Exercises     Shoulder Instructions      Home Living Family/patient expects to be discharged to:: Private residence Living Arrangements: Children Available Help at Discharge: Available PRN/intermittently Type of Home: House Home Access: Stairs to enter Technical brewer of Steps: 2 Entrance Stairs-Rails: None Home Layout: Multi-level Alternate Level Stairs-Number of Steps: 5 landing 5 Alternate Level Stairs-Rails: Right;Left;Can reach both Bathroom Shower/Tub: Tub/shower unit;Walk-in shower   Bathroom Toilet: Standard Bathroom Accessibility: Yes How Accessible: Accessible via walker Home Equipment: [place] - 2 wheels;Shower seat - built in;Grab bars - tub/shower      Lives With: Daughter    Prior Functioning/Environment Level of Independence: Independent        Comments: Pt reports that she performed her ADLs and cooking tasks at home PTA. Pt states that she has a lady who comes in to clean and her daughter assists with IADLs as needed.        OT Problem List: Decreased strength;Decreased range of motion;Decreased activity tolerance;Impaired balance  (sitting and/or standing);Decreased safety awareness;Decreased knowledge of use of DME or AE;Decreased knowledge of precautions;Impaired UE functional use      OT Treatment/Interventions: Self-care/ADL training;Therapeutic exercise;Energy conservation;DME and/or AE instruction;Therapeutic activities;Patient/family education    OT Goals(Current goals can be found in the care plan section) Acute Rehab OT Goals Patient Stated Goal: "I want to be myself again" OT Goal Formulation: With patient Time For Goal Achievement: 10/05/16 Potential to Achieve Goals: Good  OT Frequency: Min 2X/week   Barriers to D/C:            Co-evaluation              AM-PAC PT "6 Clicks" Daily Activity     Outcome Measure Help from another person eating meals?: A Little Help from another person taking care of personal grooming?: A Little Help from another person toileting, which includes using toliet, bedpan, or urinal?: A Lot Help from another person bathing (including washing, rinsing, drying)?: A Lot Help from another person to put on and taking off regular upper body clothing?: A Lot Help from another person to put on and taking off regular lower body clothing?: A Lot 6 Click Score: 14   End of Session Equipment Utilized During Treatment: Gait belt Nurse Communication: Mobility status  Activity Tolerance: Patient tolerated treatment well Patient left: in chair;with call bell/phone  within reach  OT Visit Diagnosis: Unsteadiness on feet (R26.81);Other abnormalities of gait and mobility (R26.89);Muscle weakness (generalized) (M62.81);Hemiplegia and hemiparesis Hemiplegia - Right/Left: Left Hemiplegia - dominant/non-dominant: Non-Dominant Hemiplegia - caused by: Cerebral infarction                Time: 1100-1133 OT Time Calculation (min): 33 min Charges:  OT General Charges $OT Visit: 1 Procedure OT Evaluation $OT Eval Moderate Complexity: 1 Procedure OT Treatments $Self Care/Home Management  : 8-22 mins G-Codes:     Jemila Camille MSOT, OTR/L Acute Rehab Pager: [phone] Office: [place] 09/21/2016, 12:56 PM

## 2016-09-21 NOTE — Evaluation (Signed)
Speech Language Pathology Evaluation Patient Details Name: [name] MRN: [id number] DOB: January 28, 1928 Today's Date: 09/21/2016 Time: 4010-2725 SLP Time Calculation (min) (ACUTE ONLY): 21 min  Problem List:  Patient Active Problem List   Diagnosis Date Noted  . Stroke (cerebrum) ([place]) 09/20/2016  . Diabetes mellitus with complication ([place])   . Ischemic stroke ([place])   . Status post intraocular lens implant 06/23/2011  . Glaucoma suspect 12/30/2010  . Macular hole 12/30/2010  . Nonproliferative diabetic retinopathy ([place]) 12/30/2010  . Syncope 06/17/2010  . CARDIOMYOPATHY 04/15/2010  . PLEURAL EFFUSION 04/14/2010  . SHORTNESS OF BREATH 04/14/2010  . CORONARY ATHEROSCLEROSIS NATIVE CORONARY ARTERY 01/19/2009  . Hyperlipidemia 01/06/2009  . Diabetes mellitus ([place]) 01/05/2009  . Essential hypertension 01/05/2009  . Coronary atherosclerosis 01/05/2009   Past Medical History:  Past Medical History:  Diagnosis Date  . Acute renal injury ([place]) 07/2016  . Anginal pain ([place]) 2004; 2011  . CAD (coronary artery disease)    status post stenting of the RCA status post CABG. (left internal mammary artery to  LAD, saphenous vein graft to second diagonal, saphenous vein  graft to obtuse marginal 1, saphenous vein graft to posterior  descending).  . Cardiomyopathy     Mildly reduced EF (45%).   . Chronic kidney disease (CKD), stage III (moderate)   . History of blood transfusion 1960s   "related to anemia"  . HTN (hypertension)   . Myocardial infarction ([place]) 2011  . Pleural effusion 2011   S/P "bypass"  . Shortness of breath   . Stroke ([place]) 09/19/2016   "left sided weakness" (09/20/2016)  . Type II diabetes mellitus ([place])    Past Surgical History:  Past Surgical History:  Procedure Laterality Date  . CATARACT EXTRACTION W/ INTRAOCULAR LENS  IMPLANT, BILATERAL Bilateral   . CORONARY ANGIOPLASTY WITH STENT PLACEMENT  2004   "LAD & one of the circumflex"  . CORONARY ARTERY BYPASS  GRAFT  2011   CABG X4   HPI:  [name] a 83 y.o.femalewith medical history significant for CAD, diabetes, hypertension, hyperlipidemia, presenting to the emergency department with left arm and left leg weakness. MRI of brain 11 mm acute/early subacute infarction involving right posterior   Assessment / Plan / Recommendation Clinical Impression  Pts cognitive linguistic abilities appear within fuctional limits. Pt oriented x4 and displayed good insight to current situation as well as physical deficits post CVA. Safety awareness appears intact as pt states appropriate rationale for care needs and states she knows she is at increased risk for falls since onset of left sided weakness. Pt will have support from family upon DC. She lives with her daughter who works during the day. Speech and language abilities appear intact. No ST intervention indicated at this time.     SLP Assessment  SLP Recommendation/Assessment: Patient does not need any further Speech Lanaguage Pathology Services    Follow Up Recommendations    NA   Frequency and Duration           SLP Evaluation Cognition  Overall Cognitive Status: Within Functional Limits for tasks assessed Arousal/Alertness: Awake/alert Orientation Level: Oriented X4 Attention: Focused;Sustained Focused Attention: Appears intact Sustained Attention: Appears intact Memory: Appears intact Awareness: Appears intact Problem Solving: Appears intact Executive Function: Self Monitoring;Organizing Organizing: Appears intact Self Monitoring: Appears intact Safety/Judgment: Appears intact       Comprehension  Auditory Comprehension Overall Auditory Comprehension: Appears within functional limits for tasks assessed    Expression Expression Primary Mode of Expression: Verbal Verbal  Expression Overall Verbal Expression: Appears within functional limits for tasks assessed (states some mild hoarseness but denies changes  ) Written  Expression Dominant Hand: Right   Oral / Motor  Oral Motor/Sensory Function Overall Oral Motor/Sensory Function: Within functional limits Motor Speech Overall Motor Speech: Appears within functional limits for tasks assessed   GO                    Arvil Chaco MA, CCC-SLP Acute Care Speech Language Pathologist    Levi Aland 09/21/2016, 10:36 AM

## 2016-09-22 ENCOUNTER — Encounter (HOSPITAL_COMMUNITY): Payer: Self-pay

## 2016-09-22 ENCOUNTER — Inpatient Hospital Stay (HOSPITAL_COMMUNITY): Payer: Medicare Other

## 2016-09-22 ENCOUNTER — Inpatient Hospital Stay (HOSPITAL_COMMUNITY)
Admission: RE | Admit: 2016-09-22 | Discharge: 2016-10-06 | DRG: 057 | Disposition: A | Discharge status: Other Acute Inpatient Hospital | Payer: Medicare Other | Source: Intra-hospital | Attending: Physical Medicine & Rehabilitation | Admitting: Physical Medicine & Rehabilitation

## 2016-09-22 DIAGNOSIS — Z9842 Cataract extraction status, left eye: Secondary | ICD-10-CM | POA: Diagnosis not present

## 2016-09-22 DIAGNOSIS — D62 Acute posthemorrhagic anemia: Secondary | ICD-10-CM | POA: Diagnosis present

## 2016-09-22 DIAGNOSIS — M7989 Other specified soft tissue disorders: Secondary | ICD-10-CM

## 2016-09-22 DIAGNOSIS — I739 Peripheral vascular disease, unspecified: Secondary | ICD-10-CM | POA: Diagnosis present

## 2016-09-22 DIAGNOSIS — I700 Atherosclerosis of aorta: Secondary | ICD-10-CM | POA: Diagnosis not present

## 2016-09-22 DIAGNOSIS — J90 Pleural effusion, not elsewhere classified: Secondary | ICD-10-CM | POA: Diagnosis not present

## 2016-09-22 DIAGNOSIS — R059 Cough, unspecified: Secondary | ICD-10-CM

## 2016-09-22 DIAGNOSIS — I2510 Atherosclerotic heart disease of native coronary artery without angina pectoris: Secondary | ICD-10-CM | POA: Diagnosis present

## 2016-09-22 DIAGNOSIS — I252 Old myocardial infarction: Secondary | ICD-10-CM | POA: Diagnosis not present

## 2016-09-22 DIAGNOSIS — Z7982 Long term (current) use of aspirin: Secondary | ICD-10-CM

## 2016-09-22 DIAGNOSIS — I69398 Other sequelae of cerebral infarction: Secondary | ICD-10-CM

## 2016-09-22 DIAGNOSIS — M79609 Pain in unspecified limb: Secondary | ICD-10-CM

## 2016-09-22 DIAGNOSIS — Z7984 Long term (current) use of oral hypoglycemic drugs: Secondary | ICD-10-CM

## 2016-09-22 DIAGNOSIS — Z79899 Other long term (current) drug therapy: Secondary | ICD-10-CM | POA: Diagnosis not present

## 2016-09-22 DIAGNOSIS — I999 Unspecified disorder of circulatory system: Secondary | ICD-10-CM | POA: Diagnosis not present

## 2016-09-22 DIAGNOSIS — E1151 Type 2 diabetes mellitus with diabetic peripheral angiopathy without gangrene: Secondary | ICD-10-CM | POA: Diagnosis present

## 2016-09-22 DIAGNOSIS — N179 Acute kidney failure, unspecified: Secondary | ICD-10-CM | POA: Diagnosis not present

## 2016-09-22 DIAGNOSIS — E1159 Type 2 diabetes mellitus with other circulatory complications: Secondary | ICD-10-CM

## 2016-09-22 DIAGNOSIS — E785 Hyperlipidemia, unspecified: Secondary | ICD-10-CM | POA: Diagnosis present

## 2016-09-22 DIAGNOSIS — I129 Hypertensive chronic kidney disease with stage 1 through stage 4 chronic kidney disease, or unspecified chronic kidney disease: Secondary | ICD-10-CM | POA: Diagnosis present

## 2016-09-22 DIAGNOSIS — E1142 Type 2 diabetes mellitus with diabetic polyneuropathy: Secondary | ICD-10-CM | POA: Diagnosis present

## 2016-09-22 DIAGNOSIS — E1122 Type 2 diabetes mellitus with diabetic chronic kidney disease: Secondary | ICD-10-CM | POA: Diagnosis present

## 2016-09-22 DIAGNOSIS — Z9841 Cataract extraction status, right eye: Secondary | ICD-10-CM

## 2016-09-22 DIAGNOSIS — N183 Chronic kidney disease, stage 3 unspecified: Secondary | ICD-10-CM

## 2016-09-22 DIAGNOSIS — I69354 Hemiplegia and hemiparesis following cerebral infarction affecting left non-dominant side: Principal | ICD-10-CM

## 2016-09-22 DIAGNOSIS — R0989 Other specified symptoms and signs involving the circulatory and respiratory systems: Secondary | ICD-10-CM

## 2016-09-22 DIAGNOSIS — I10 Essential (primary) hypertension: Secondary | ICD-10-CM | POA: Diagnosis not present

## 2016-09-22 DIAGNOSIS — Z888 Allergy status to other drugs, medicaments and biological substances status: Secondary | ICD-10-CM

## 2016-09-22 DIAGNOSIS — R269 Unspecified abnormalities of gait and mobility: Secondary | ICD-10-CM

## 2016-09-22 DIAGNOSIS — Z794 Long term (current) use of insulin: Secondary | ICD-10-CM | POA: Diagnosis not present

## 2016-09-22 DIAGNOSIS — Z961 Presence of intraocular lens: Secondary | ICD-10-CM | POA: Diagnosis present

## 2016-09-22 DIAGNOSIS — R05 Cough: Secondary | ICD-10-CM

## 2016-09-22 DIAGNOSIS — E118 Type 2 diabetes mellitus with unspecified complications: Secondary | ICD-10-CM

## 2016-09-22 DIAGNOSIS — Z955 Presence of coronary angioplasty implant and graft: Secondary | ICD-10-CM | POA: Diagnosis not present

## 2016-09-22 DIAGNOSIS — I509 Heart failure, unspecified: Secondary | ICD-10-CM | POA: Diagnosis present

## 2016-09-22 DIAGNOSIS — Z951 Presence of aortocoronary bypass graft: Secondary | ICD-10-CM

## 2016-09-22 LAB — GLUCOSE, CAPILLARY
GLUCOSE-CAPILLARY: 150 mg/dL — AB (ref 65–99)
GLUCOSE-CAPILLARY: 220 mg/dL — AB (ref 65–99)
GLUCOSE-CAPILLARY: 158 mg/dL — AB (ref 65–99)
GLUCOSE-CAPILLARY: 234 mg/dL — AB (ref 65–99)

## 2016-09-22 LAB — CBC
HCT: 32.1 % — ABNORMAL LOW (ref 36.0–46.0)
Hemoglobin: 10.1 g/dL — ABNORMAL LOW (ref 12.0–15.0)
MCH: 27.8 pg (ref 26.0–34.0)
MCHC: 31.5 g/dL (ref 30.0–36.0)
MCV: 88.4 fL (ref 78.0–100.0)
PLATELETS: 232 10*3/uL (ref 150–400)
RBC: 3.63 MIL/uL — AB (ref 3.87–5.11)
RDW: 13.7 % (ref 11.5–15.5)
WBC: 5.7 10*3/uL (ref 4.0–10.5)

## 2016-09-22 LAB — BASIC METABOLIC PANEL
Anion gap: 8 (ref 5–15)
BUN: 29 mg/dL — AB (ref 6–20)
CO2: 23 mmol/L (ref 22–32)
CREATININE: 1.86 mg/dL — AB (ref 0.44–1.00)
Calcium: 9.9 mg/dL (ref 8.9–10.3)
Chloride: 107 mmol/L (ref 101–111)
GFR calc Af Amer: 27 mL/min — ABNORMAL LOW (ref >60)
GFR, EST NON AFRICAN AMERICAN: 23 mL/min — AB (ref >60)
GLUCOSE: 247 mg/dL — AB (ref 65–99)
Potassium: 3.9 mmol/L (ref 3.5–5.1)
SODIUM: 138 mmol/L (ref 135–145)

## 2016-09-22 MED ORDER — LOSARTAN POTASSIUM 50 MG PO TABS
50.0000 mg | ORAL_TABLET | Freq: Two times a day (BID) | ORAL | Status: DC
  Administered 2016-09-22 – 2016-09-28 (×13): 50 mg via ORAL
  Filled 2016-09-22 (×14): qty 1

## 2016-09-22 MED ORDER — AMLODIPINE BESYLATE 10 MG PO TABS
10.0000 mg | ORAL_TABLET | Freq: Every day | ORAL | Status: DC
Start: 2016-09-23 — End: 2016-10-06
  Administered 2016-09-23 – 2016-10-06 (×14): 10 mg via ORAL
  Filled 2016-09-22 (×15): qty 1

## 2016-09-22 MED ORDER — HEPARIN SODIUM (PORCINE) 5000 UNIT/ML IJ SOLN
5000.0000 [IU] | Freq: Three times a day (TID) | INTRAMUSCULAR | Status: DC
  Administered 2016-09-22 – 2016-10-06 (×42): 5000 [IU] via SUBCUTANEOUS
  Filled 2016-09-22 (×42): qty 1

## 2016-09-22 MED ORDER — SORBITOL 70 % SOLN
30.0000 mL | Freq: Every day | Status: DC | PRN
  Administered 2016-09-23 – 2016-10-03 (×2): 30 mL via ORAL
  Filled 2016-09-22 (×2): qty 30

## 2016-09-22 MED ORDER — ACETAMINOPHEN 160 MG/5ML PO SOLN: 650.0000 mg | ORAL | Status: DC | PRN

## 2016-09-22 MED ORDER — INSULIN ASPART 100 UNIT/ML ~~LOC~~ SOLN
0.0000 [IU] | Freq: Three times a day (TID) | SUBCUTANEOUS | Status: DC
  Administered 2016-09-23: 2 [IU] via SUBCUTANEOUS
  Administered 2016-09-23 (×2): 3 [IU] via SUBCUTANEOUS
  Administered 2016-09-24: 2 [IU] via SUBCUTANEOUS
  Administered 2016-09-24: 5 [IU] via SUBCUTANEOUS
  Administered 2016-09-25 – 2016-09-26 (×4): 2 [IU] via SUBCUTANEOUS
  Administered 2016-09-26: 3 [IU] via SUBCUTANEOUS
  Administered 2016-09-26: 2 [IU] via SUBCUTANEOUS
  Administered 2016-09-27: 1 [IU] via SUBCUTANEOUS
  Administered 2016-09-27 – 2016-09-28 (×3): 2 [IU] via SUBCUTANEOUS
  Administered 2016-09-28: 1 [IU] via SUBCUTANEOUS
  Administered 2016-09-28 – 2016-09-29 (×2): 2 [IU] via SUBCUTANEOUS
  Administered 2016-09-29 – 2016-09-30 (×4): 1 [IU] via SUBCUTANEOUS
  Administered 2016-09-30 – 2016-10-01 (×3): 2 [IU] via SUBCUTANEOUS
  Administered 2016-10-01: 3 [IU] via SUBCUTANEOUS
  Administered 2016-10-02 (×2): 1 [IU] via SUBCUTANEOUS
  Administered 2016-10-02 – 2016-10-03 (×3): 2 [IU] via SUBCUTANEOUS
  Administered 2016-10-04: 1 [IU] via SUBCUTANEOUS
  Administered 2016-10-04 – 2016-10-05 (×2): 2 [IU] via SUBCUTANEOUS
  Administered 2016-10-06: 1 [IU] via SUBCUTANEOUS

## 2016-09-22 MED ORDER — ACETAMINOPHEN 650 MG RE SUPP: 650.0000 mg | RECTAL | Status: DC | PRN

## 2016-09-22 MED ORDER — METOPROLOL TARTRATE 50 MG PO TABS
50.0000 mg | ORAL_TABLET | Freq: Every day | ORAL | Status: DC
  Administered 2016-09-23 – 2016-09-28 (×6): 50 mg via ORAL
  Filled 2016-09-22 (×7): qty 1

## 2016-09-22 MED ORDER — CLOPIDOGREL BISULFATE 75 MG PO TABS: 75.0000 mg | ORAL_TABLET | Freq: Every day | ORAL | 0 refills | Status: DC

## 2016-09-22 MED ORDER — ISOSORBIDE MONONITRATE ER 30 MG PO TB24
60.0000 mg | ORAL_TABLET | Freq: Every day | ORAL | Status: DC
  Administered 2016-09-23 – 2016-10-06 (×14): 60 mg via ORAL
  Filled 2016-09-22 (×15): qty 2

## 2016-09-22 MED ORDER — HEPARIN SODIUM (PORCINE) 5000 UNIT/ML IJ SOLN: 5000.0000 [IU] | Freq: Three times a day (TID) | INTRAMUSCULAR | Status: DC

## 2016-09-22 MED ORDER — ATORVASTATIN CALCIUM 40 MG PO TABS: 40.0000 mg | ORAL_TABLET | Freq: Every day | ORAL | 0 refills | Status: DC

## 2016-09-22 MED ORDER — ONDANSETRON HCL 4 MG/2ML IJ SOLN: 4.0000 mg | Freq: Four times a day (QID) | INTRAMUSCULAR | Status: DC | PRN

## 2016-09-22 MED ORDER — ONDANSETRON HCL 4 MG PO TABS
4.0000 mg | ORAL_TABLET | Freq: Four times a day (QID) | ORAL | Status: DC | PRN
  Administered 2016-10-03: 4 mg via ORAL
  Filled 2016-09-22: qty 1

## 2016-09-22 MED ORDER — CHOLECALCIFEROL 10 MCG (400 UNIT) PO TABS
400.0000 [IU] | ORAL_TABLET | Freq: Every day | ORAL | Status: DC
  Administered 2016-09-23 – 2016-10-06 (×14): 400 [IU] via ORAL
  Filled 2016-09-22 (×14): qty 1

## 2016-09-22 MED ORDER — ATORVASTATIN CALCIUM 40 MG PO TABS
40.0000 mg | ORAL_TABLET | Freq: Every day | ORAL | Status: DC
  Administered 2016-09-23 – 2016-10-05 (×13): 40 mg via ORAL
  Filled 2016-09-22 (×13): qty 1

## 2016-09-22 MED ORDER — CLOPIDOGREL BISULFATE 75 MG PO TABS
75.0000 mg | ORAL_TABLET | Freq: Every day | ORAL | Status: DC
  Administered 2016-09-23 – 2016-10-06 (×14): 75 mg via ORAL
  Filled 2016-09-22 (×14): qty 1

## 2016-09-22 MED ORDER — ACETAMINOPHEN 325 MG PO TABS
650.0000 mg | ORAL_TABLET | ORAL | Status: DC | PRN
  Filled 2016-09-22: qty 2

## 2016-09-22 NOTE — Progress Notes (Signed)
VASCULAR LAB PRELIMINARY  PRELIMINARY  PRELIMINARY  PRELIMINARY  Bilateral lower extremity venous duplex completed.    Preliminary report:  Bilateral:  No evidence of DVT, superficial thrombosis, or Baker's Cyst.   Legacy Carrender, RVS 09/22/2016, 2:41 PM

## 2016-09-22 NOTE — Progress Notes (Addendum)
Rehab admissions - I have approval from [facility] for acute inpatient rehab admission for today.  I have beds open on rehab today.  I will contact attending to see if patient is medically ready for admit today.  Call me for questions.  #[phone]  I spoke with Dr. Florene Glen.  An ultrasound test in pending.  I will await call from MD to see if patient can admit to acute inpatient rehab later today.  #[phone]

## 2016-09-22 NOTE — Progress Notes (Signed)
Physical Therapy Treatment Patient Details Name: [name] MRN: [id number] DOB: 1928-01-15 Today's Date: 09/22/2016    History of Present Illness 83 y.o. female admitted on 09/20/16 with left sided weakness.  Pt's MRI revealed 74mm acute/early subacute infarction R posterior limb of internal capsule and lateral thalamus.  Pt with significant PMH of DM2, SOB, MI, HTN, blood transfusion, CKD III, cardiomyopathy, CAD, and CABG.    PT Comments    Pt is progressing well with gait and mobility, however, needed more convincing today to get up and walk.  "I can't" pt said.  I encouraged her to say, "I'll try" instead.  Son present and assisting with IV pole was excited to see that his mom could walk with assistance.  Pt is scheduled to d/c to CIR today.  PT to follow acutely until d/c confirmed.     Follow Up Recommendations  CIR     Equipment Recommendations  Rolling walker with 5" wheels    Recommendations for Other Services Rehab consult     Precautions / Restrictions Precautions Precautions: Fall Precaution Comments: left sided weakness    Mobility  Bed Mobility Overal bed mobility: Needs Assistance Bed Mobility: Supine to Sit     Supine to sit: Min assist     General bed mobility comments: Min assist to help progress left leg to EOB, assist to support trunk during transition to sit.    Transfers Overall transfer level: Needs assistance Equipment used: Rolling walker (2 wheeled) Transfers: Sit to/from Stand Sit to Stand: Min assist         General transfer comment: Min assist to support trunk during transitions.  Verbal cues for safe hand placement.   Ambulation/Gait Ambulation/Gait assistance: Mod assist Ambulation Distance (Feet): 25 Feet Assistive device: Rolling walker (2 wheeled) Gait Pattern/deviations: Step-to pattern;Steppage;Decreased dorsiflexion - left;Decreased step length - left Gait velocity: decreased Gait velocity interpretation: Below normal  speed for age/gender General Gait Details: Pt needed assist at trunk for balance and weight shift as well as tactile cues for larger step length on her left and some light stability at her knee during stance phase to prevent buckling over the left leg especially as she fatigued.         Modified Rankin (Stroke Patients Only) Modified Rankin (Stroke Patients Only) Pre-Morbid Rankin Score: No symptoms Modified Rankin: Moderately severe disability     Balance Overall balance assessment: Needs assistance Sitting-balance support: No upper extremity supported;Feet supported Sitting balance-Leahy Scale: Fair     Standing balance support: Bilateral upper extremity supported Standing balance-Leahy Scale: Poor                              Cognition Arousal/Alertness: Awake/alert Behavior During Therapy: WFL for tasks assessed/performed Overall Cognitive Status: No family/caregiver present to determine baseline cognitive functioning                                 General Comments: Not specifically tested.  Pt more fearful of falling today, needed some convincing to get up and walk.             Pertinent Vitals/Pain Pain Assessment: No/denies pain           PT Goals (current goals can now be found in the care plan section) Acute Rehab PT Goals Patient Stated Goal: "I want to be myself again" Progress towards PT goals: Progressing  toward goals    Frequency    Min 4X/week      PT Plan Current plan remains appropriate       AM-PAC PT "6 Clicks" Daily Activity  Outcome Measure  Difficulty turning over in bed (including adjusting bedclothes, sheets and blankets)?: Unable Difficulty moving from lying on back to sitting on the side of the bed? : Unable Difficulty sitting down on and standing up from a chair with arms (e.g., wheelchair, bedside commode, etc,.)?: Unable Help needed moving to and from a bed to chair (including a wheelchair)?: A  Little Help needed walking in hospital room?: A Lot Help needed climbing 3-5 steps with a railing? : A Lot 6 Click Score: 10    End of Session Equipment Utilized During Treatment: Gait belt Activity Tolerance: Patient limited by fatigue Patient left: in chair;with call bell/phone within reach;with chair alarm set;with family/visitor present   PT Visit Diagnosis: Muscle weakness (generalized) (M62.81);Difficulty in walking, not elsewhere classified (R26.2);Hemiplegia and hemiparesis;Other symptoms and signs involving the nervous system (R29.898) Hemiplegia - Right/Left: Left Hemiplegia - dominant/non-dominant: Non-dominant Hemiplegia - caused by: Cerebral infarction     Time: 1727-1750 PT Time Calculation (min) (ACUTE ONLY): 23 min  Charges:  $Gait Training: 8-22 mins $Therapeutic Activity: 8-22 mins          Runa Whittingham B. [place], [place], DPT [phone]            09/22/2016, 5:55 PM

## 2016-09-22 NOTE — PMR Pre-admission (Signed)
PMR Admission Coordinator Pre-Admission Assessment  Patient: [name] is an 83 y.o., female MRN: [id number] DOB: [birth year] Height: _0  (160 cm) Weight: 76.2 kg (168 lb)             Insurance Information HMO:      PPO:  Yes     PCP:       IPA:       80/20:       OTHER:  Group # [id number] PRIMARY: U[employer] Medicare      Policy#: [id number]      Subscriber: Verlon Au CM Name: Vevelyn Royals      Phone#: [phone]     Fax#: [phone] Pre-Cert#: [id number] with weekly updates      Employer: Retired Benefits:  Phone #: [phone]     Name:  Online Eff. Date: 02/01/16     Deduct:  $0      Out of Pocket Max: $4000 (met $212.17)      Life Max: N/A CIR:  $160 days 1-10      SNF: $0 days 1-20; $50 days 21-100 Outpatient:  Medical necessity     Co-Pay: $20/visit Home Health: 100%      Co-Pay: none DME: 80%     Co-Pay: 20% Providers: in network  Medicaid Application Date:        Case Manager:   Disability Application Date:        Case Worker:    Emergency Contact Information Contact Information    Name Relation Home Work Mobile   Erlich,Lynn Daughter   [place] Son   [phone]     Current Medical History  Patient Admitting Diagnosis: Right PLIC infarct causing left hemiparesis   History of Present Illness: An 83 y.o.right handed femalewith history of CAD/CABGmaintained on aspirin., diabetes mellitus, hypertension, CKD stage III.Per chart review patient lives with daughter. Independent prior to admission. She does not drive. Daughter works during the day.Presented 09/20/2016 with left-sided weakness. Cranial CT scan negative. Patient did not receive TPA. MRI of the brain showed 11 mm acute early subacute infarction involving right posterior limb of internal capsule and lateral thalamus. MRA with no large vessel occlusion or aneurysm. Echocardiogram with ejection fraction of 76% grade 1 diastolic dysfunction.  Currently maintained on Plavix for CVA prophylaxis.  Subcutaneous heparin for DVT prophylaxis. Physical and occupational therapy evaluation pending. M.D. has requested physical medicine rehabilitation consult. Patient to be admitted for a comprehensive inpatient rehabilitation program.   Total: 3=NIH  Past Medical History  Past Medical History:  Diagnosis Date  . Acute renal injury ([place]) 07/2016  . Anginal pain ([place]) 2004; 2011  . CAD (coronary artery disease)    status post stenting of the RCA status post CABG. (left internal mammary artery to  LAD, saphenous vein graft to second diagonal, saphenous vein  graft to obtuse marginal 1, saphenous vein graft to posterior  descending).  . Cardiomyopathy     Mildly reduced EF (45%).   . Chronic kidney disease (CKD), stage III (moderate)   . History of blood transfusion 1960s   "related to anemia"  . HTN (hypertension)   . Myocardial infarction ([place]) 2011  . Pleural effusion 2011   S/P "bypass"  . Shortness of breath   . Stroke ([place]) 09/19/2016   "left sided weakness" (09/20/2016)  . Type II diabetes mellitus ([facility])     Family History  family history is not on file.  Prior Rehab/Hospitalizations: [facility] in 2011 and in  2012 after heart attack and then broken shoulder.  Has the patient had major surgery during 100 days prior to admission? No  Current Medications   Current Facility-Administered Medications:  .   stroke: mapping our early stages of recovery book, , Does not apply, Once, Shawn Route, Clarise Cruz E, PA-C .  0.9 %  sodium chloride infusion, , Intravenous, Continuous, Rondel Jumbo, PA-C, Last Rate: 75 mL/hr at 09/22/16 0525 .  acetaminophen (TYLENOL) tablet 650 mg, 650 mg, Oral, Q4H PRN **OR** acetaminophen (TYLENOL) solution 650 mg, 650 mg, Per Tube, Q4H PRN **OR** acetaminophen (TYLENOL) suppository 650 mg, 650 mg, Rectal, Q4H PRN, Shawn Route, Sara E, PA-C .  amLODipine (NORVASC) tablet 10 mg, 10 mg, Oral, Daily, Wertman, Sara E, PA-C, 10 mg at 09/22/16 1023 .   atorvastatin (LIPITOR) tablet 40 mg, 40 mg, Oral, q1800, Patteson, Samuel A, NP, 40 mg at 09/21/16 1706 .  cholecalciferol (VITAMIN D) tablet 400 Units, 400 Units, Oral, Daily, Rondel Jumbo, PA-C, 400 Units at 09/22/16 1023 .  clopidogrel (PLAVIX) tablet 75 mg, 75 mg, Oral, Daily, Patteson, Samuel A, NP, 75 mg at 09/22/16 1023 .  heparin injection 5,000 Units, 5,000 Units, Subcutaneous, Q8H, Rondel Jumbo, PA-C, 5,000 Units at 09/22/16 1517 .  hydrALAZINE (APRESOLINE) injection 5-10 mg, 5-10 mg, Intravenous, Q8H PRN, Shawn Route, Sara E, PA-C .  insulin aspart (novoLOG) injection 0-9 Units, 0-9 Units, Subcutaneous, TID WC, Wertman, Coralee Pesa, PA-C, 3 Units at 09/22/16 1158 .  isosorbide mononitrate (IMDUR) 24 hr tablet 60 mg, 60 mg, Oral, Daily, Rondel Jumbo, PA-C, 60 mg at 09/22/16 1023 .  losartan (COZAAR) tablet 50 mg, 50 mg, Oral, BID, Rondel Jumbo, PA-C, 50 mg at 09/22/16 1023 .  metoprolol tartrate (LOPRESSOR) tablet 50 mg, 50 mg, Oral, Daily, Rondel Jumbo, PA-C, 50 mg at 09/22/16 1023 .  senna-docusate (Senokot-S) tablet 1 tablet, 1 tablet, Oral, QHS PRN, Rondel Jumbo, PA-C  Patients Current Diet: Diet heart healthy/carb modified Room service appropriate? Yes; Fluid consistency: Thin  Precautions / Restrictions Precautions Precautions: Fall Restrictions Weight Bearing Restrictions: No   Has the patient had 2 or more falls or a fall with injury in the past year?No  Prior Activity Level Community (5-7x/wk): Went out 4 times a week, was driving.  Home Assistive Devices / Equipment Home Assistive Devices/Equipment: Eyeglasses, Dentures (specify type), CBG Meter, Bedside commode/3-in-1, Grab bars in shower, Hand-held shower hose, Built-in shower seat Home Equipment: Walker - 2 wheels, Shower seat - built in, [employer] - tub/shower  Prior Device Use: Indicate devices/aids used by the patient prior to current illness, exacerbation or injury? None  Prior Functional Level Prior  Function Level of Independence: Independent Comments: Pt reports that she performed her ADLs and cooking tasks at home PTA. Pt states that she has a lady who comes in to clean and her daughter assists with IADLs as needed.  Self Care: Did the patient need help bathing, dressing, using the toilet or eating?  Independent  Indoor Mobility: Did the patient need assistance with walking from room to room (with or without device)? Independent  Stairs: Did the patient need assistance with internal or external stairs (with or without device)? Independent  Functional Cognition: Did the patient need help planning regular tasks such as shopping or remembering to take medications? Independent  Current Functional Level Cognition  Arousal/Alertness: Awake/alert Overall Cognitive Status: No family/caregiver present to determine baseline cognitive functioning Orientation Level: Oriented X4 General Comments: Noted some decreased problem solving and  requiring increased time for tasks. Pt unable to talk while performing movements. Family not present to determine cogntitive baseline funciton (per OT note). Attention: Focused, Sustained Focused Attention: Appears intact Sustained Attention: Appears intact Memory: Appears intact Awareness: Appears intact Problem Solving: Appears intact Executive Function: Self Monitoring, Organizing Organizing: Appears intact Self Monitoring: Appears intact Safety/Judgment: Appears intact    Extremity Assessment (includes Sensation/Coordination)  Upper Extremity Assessment: Defer to OT evaluation LUE Deficits / Details: Pt unable to bring hand to mouth and uses compensatory shoulder hiking with shoulder flexion. Demonstrates decreased FM, composite grasp, and coordination of LUE. Pt unable to grasp lotion bottle in L hand to open and places bottle between her forearm and trunk to hold while R hand unscrews cap. Pt able to perform finger opposition of L hand slowly.  Lower  Extremity Assessment: LLE deficits/detail LLE Deficits / Details: left leg with 2/5 DF, 3-/5 knee extension, 2/5 hip flexion per gross seated MMT.  LLE Sensation: decreased light touch    ADLs  Overall ADL's : Needs assistance/impaired Eating/Feeding: Minimal assistance, Sitting Eating/Feeding Details (indicate cue type and reason): Pt reports dropping cups and utensils with LUE Grooming: Min guard, Sitting Upper Body Bathing: Moderate assistance, Sitting Lower Body Bathing: Maximal assistance, Sit to/from stand Upper Body Dressing : Moderate assistance Lower Body Dressing: Maximal assistance, Sit to/from stand Lower Body Dressing Details (indicate cue type and reason): Pt requiring Max A to maitnain standing balance. Pt able to reach down and adjust socks while seated with Min Guard for safety.  Toilet Transfer: Moderate assistance, Stand-pivot (Simulated to recliner) Toilet Transfer Details (indicate cue type and reason): Performed stand pivot transfer to recliner. Pt requiring Mod A to maintain standing balance and turn    Mobility  Overal bed mobility: Needs Assistance Bed Mobility: Supine to Sit, Sit to Supine Supine to sit: Min guard, HOB elevated Sit to supine: Min guard, HOB elevated General bed mobility comments: Pt is OOB in the recliner chair.    Transfers  Overall transfer level: Needs assistance Equipment used: 2 person hand held assist, Rolling walker (2 wheeled) Transfers: Sit to/from Stand Sit to Stand: Min assist Stand pivot transfers: Mod assist General transfer comment: Min asisst to support trunk and stabilize RW during transitions.  Verbal cues for safe hand placement and manual assist at left arm/hand to keep WB throught the chair armrest during transitions.     Ambulation / Gait / Stairs / Wheelchair Mobility  Ambulation/Gait Ambulation/Gait assistance: Mod assist Ambulation Distance (Feet): 15 Feet (x2) Assistive device: Rolling walker (2 wheeled) Gait  Pattern/deviations: Step-through pattern, Decreased stance time - left, Decreased dorsiflexion - left, Decreased step length - left General Gait Details: Pt with left sided steppage gait pattern with mod assist needed to help control left knee and support trunk and steer RW during gait.  Pt needed light manual assist to terminally extend her left knee in stance so that she could safely lift right leg and WB fully on left leg without it buckling.      Posture / Balance Dynamic Sitting Balance Sitting balance - Comments: Pt with some R lateral lean which she is able to autocorrect in sitting.  Balance Overall balance assessment: Needs assistance Sitting-balance support: No upper extremity supported, Feet supported Sitting balance-Leahy Scale: Fair Sitting balance - Comments: Pt with some R lateral lean which she is able to autocorrect in sitting.  Standing balance support: Bilateral upper extremity supported, During functional activity Standing balance-Leahy Scale: Poor Standing balance comment:  Required therapist and RW for support.     Special needs/care consideration BiPAP/CPAP No CPM No Continuous Drip IV 0.9% NS 75 mL/hr Dialysis No     Life Vest No Oxygen No Special Bed No Trach Size No Wound Vac (area) No      Skin No                              Bowel mgmt: Last BM 09/20/16 per patient Bladder mgmt: Voiding on bedpan and up on [facility] Diabetic mgmt Yes on oral medication at home, but insulin while in the hosptial    Previous Home Environment Living Arrangements: Children  Lives With: Daughter Available Help at Discharge: Available PRN/intermittently Type of Home: House Home Layout: Multi-level Alternate Level Stairs-Rails: Right, Left, Can reach both Alternate Level Stairs-Number of Steps: 5 landing 5 Home Access: Stairs to enter Entrance Stairs-Rails: None Entrance Stairs-Number of Steps: 2 Bathroom Shower/Tub: Tub/shower unit, Multimedia programmer:  Programmer, systems: Yes How Accessible: Accessible via walker [place]: No  Discharge Living Setting Plans for Discharge Living Setting: Patient's home, House, Lives with (comment) (Daughter and nephew live with her.) Type of Home at Discharge: House Discharge Home Layout: Multi-level, Full bath on main level, Bed/bath upstairs (Can convert den to a bedroom and put bed in den if needed) Alternate Level Stairs-Number of Steps: 5 then platform and the 5 more Discharge Home Access: Stairs to enter [employer] of Steps: 2 step entry Does the patient have any problems obtaining your medications?: No  Social/Family/Support Systems Patient Roles: Parent (Has a daughter, a son and a nephew.) Contact Information: Aaralynn Shepheard - daughter and Ayaan Ringle - son Anticipated Caregiver: Daughter and nephew Anticipated Caregiver's Contact Information:  See emergency contacts Ability/Limitations of Caregiver: Daughter works days and nephew works nights.  Nephew is there most of the time. Caregiver Availability: Other (Comment) (Patient plans to ask nephew to stay during day with her.) Discharge Plan Discussed with Primary Caregiver: Yes Is Caregiver In Agreement with Plan?: Yes Does Caregiver/Family have Issues with Lodging/Transportation while Pt is in Rehab?: No  Goals/Additional Needs Patient/Family Goal for Rehab: PT/OT mod I and supervision goals Expected length of stay: 7-10 days Cultural Considerations: None Dietary Needs: Heart healthy, carb mod, thin liquids Equipment Needs: TBD Pt/Family Agrees to Admission and willing to participate: Yes Program Orientation Provided & Reviewed with Pt/Caregiver Including Roles  & Responsibilities: Yes  Decrease burden of Care through IP rehab admission: N/A  Possible need for SNF placement upon discharge: Not planned  Patient Condition: This patient's condition remains as documented in the consult dated 09/21/16, in  which the Rehabilitation Physician determined and documented that the patient's condition is appropriate for intensive rehabilitative care in an inpatient rehabilitation facility. Will admit to inpatient rehab today.  Preadmission Screen Completed By:  Retta Diones, 09/22/2016 4:14 PM ______________________________________________________________________   Discussed status with Dr. Letta Pate on 09/22/16 at 1613 and received telephone approval for admission today.  Admission Coordinator:  Retta Diones, time 1613/Date 09/22/16

## 2016-09-22 NOTE — Care Management Note (Signed)
Case Management Note  Patient Details  Name: [name] MRN: [id number] Date of Birth: 20-Mar-1927  Subjective/Objective:                 Spoke w [facility] admission coordinator regarding dispo planning. CIR awaiting insurance auth. If approved, beds are available today.    Action/Plan:  Anticipate hearing back from CIR about insurance auth today.  Expected Discharge Date:                  Expected Discharge Plan:  [place]  In-House Referral:     Discharge planning Services  CM Consult  Post Acute Care Choice:    Choice offered to:     DME Arranged:    DME Agency:     HH Arranged:    [place] Agency:     Status of Service:  In process, will continue to follow  If discussed at Long Length of Stay Meetings, dates discussed:    Additional Comments:  Carles Collet, RN 09/22/2016, 1:41 PM

## 2016-09-22 NOTE — Discharge Summary (Signed)
Physician Discharge Summary  [name] BUL:[id number] DOB: 07/04/1927 DOA: 09/20/2016  PCP: Deland Pretty, MD  Admit date: 09/20/2016 Discharge date: 09/22/2016  Time spent: 35 minutes  Recommendations for Outpatient Follow-up and Inpatient rehab:  1. Patient transferred to inpatient rehab 2. Please continue plavix and atorvastatin for stroke.  Follow up with Dr. Leonie Man in 6 weeks with neurology. 3. Continue to monitor BG's (on glipizide at home), consider standing long acting prn  4. Blood pressures on high side, continue home amlodipine, imdur, losartan.  She was taking metoprolol 50 mg daily, will change this to BID as prescribed by cards. She's asx, but if continue to be elevated, would consider uptitrating or adding meds (per neuro note, gradually normalize BP in 5-7 days) 5. Continue to monitor daily creatinine.  Creatinine elevated to 2.04 on admission, improved to 1.86 now.  Unclear what her baseline is, but she'd been referred to nephrology as an outpatient it seems based on 07/2016 note by Dr. Percival Spanish.  Will need outpatient nephrology follow up given her proteinuria, elevated creatinine.  If not continuing to trend down and improve, would be happy to provide further recommendations for following up kidney function.   6. Hospitalist will be happy to consult for medical problems while in rehab   Discharge Diagnoses:  Principal Problem:   Ischemic stroke [facility] Active Problems:   Diabetes mellitus ([place])   Hyperlipidemia   Essential hypertension   Coronary atherosclerosis   Glaucoma suspect   Nonproliferative diabetic retinopathy ([place])   Status post intraocular lens implant   Stroke (cerebrum) ([place])   Diabetes mellitus with complication [facility])   Discharge Condition: stable  Diet recommendation: heart healthy  Filed Weights   09/21/16 0100  Weight: 76.2 kg (168 lb)    History of present illness:  83 y.o.femalewith medical history significant for CAD, diabetes,  hypertension, hyperlipidemia, presenting to the emergency department with left arm and left leg weakness.  She was not [name] TPA candidate.  MRI notable for 11 mm infarction of R posterior limb of internal capsule and lateral thalamus.  She was admitted for CVA.    Hospital Course:  Patient had MRI/MRA possible for 1 mm acute/early subacute infarction involving R posterior limb of internal capsule and lateral thalamus (advanced chornic microvascular ischemic changes, volume loss), patent carotid and vertebral ateries and patent circle of willis, she had an echo which was only notable for diastolic dysfunction. Telemetry showed sinus rhythm. she was started on Plavix and atorvastatin.  She was admitted to inpatient rehabilitation on hospital day 2.    Acute-L sided weakness, dysarthria / CVA  Head CT without acute intracranial pathology.   MRI brain with 11 mm acute/early subacute infarction involving R posterior limb of internal capsule and lateral thalamus (advanced chornic microvascular ischemic changes, volume loss), patent carotid and vertebral ateries and patent circle of willis.  - stroke protocol - A1c 7.7, LDL 101, tchol 167 - echo with grade 1 disastolic dysfunction and EF 55-60% (mild MVR and mild dilation of L atrium) - tele with sinus in 70's - PT/OT/SLP - receiving plavix/atorvastatin  - Neuro Following, appreciate involvement  - Care management forinpatient rehab   Type II Diabetes AM BG 146, continue sliding scale insulin  Recent Labs       Lab Results  Component Value Date   HGBA1C 7.7 (H) 09/20/2016       Hold Glucotrol  . Hypertension - elevated this AM to systolic 321 Resume home BP meds  Hyperlipidemia Continue home  statins  Chronic kidney disease stage 3- 4 Recent Labs       Lab Results  Component Value Date   CREATININE 2.00 (H) 09/20/2016     Continue to monitor, last creatinine in our system is from 2013.  May need outpatient follow up.   Looks like f/u planned per recent cards note.  Normocytic Anemia: Hemoglobin on admission 10.9 at baseline  Stable  Left LEE - negative [place]  Procedures:  Echo with normal EF, grade 1 diastolic dysfunction  LE venous duplex eval - no evidence of VTE   Consultations:  neurology  Discharge Exam: Vitals:   09/22/16 0135 09/22/16 1043  BP: (!) 141/67 (!) 185/81  Pulse: 74 76  Resp: 16 16  Temp: 98.6 F (37 C) 98.6 F (37 C)  SpO2: 99% 99%    General: NAD Cardiovascular: RRR, no mgr Respiratory: CTAB, unlabored Abd: S/NT/ND Ext: minimally pitting edema to LLE > R Neuro: L sided weakness, 2/5 strength to L.  No facial droop.  Sensation decreased to L side.  Discharge Instructions   Discharge Instructions    Ambulatory referral to Neurology    Complete by:  As directed    An appointment is requested in approximately:6   Call MD for:  difficulty breathing, headache or visual disturbances    Complete by:  As directed    Call MD for:  persistant dizziness or light-headedness    Complete by:  As directed    Call MD for:  persistant nausea and vomiting    Complete by:  As directed    Call MD for:  severe uncontrolled pain    Complete by:  As directed    Call MD for:  temperature >100.4    Complete by:  As directed    Diet - low sodium heart healthy    Complete by:  As directed    Diet - low sodium heart healthy    Complete by:  As directed    Discharge instructions    Complete by:  As directed    You were admitted to the hospital for [place] stroke.  You had an MRI which showed [name] stroke.  The workup including echo and imaging of the veins in your neck did not reveal Abbey Veith cause of your stroke.  You have been started on plavix (which you should take instead of the aspirin).  Also, please continue the atorvastatin 40 mg daily as well.  Please follow up with neurology in about 6 weeks. Please follow up with your PCP.  Continued control of your blood pressure and diabetes will be very  important.   Increase activity slowly    Complete by:  As directed    Increase activity slowly    Complete by:  As directed      Discharge Medication List as of 09/22/2016  6:08 PM    CONTINUE these medications which have NOT CHANGED   Details  amLODipine (NORVASC) 10 MG tablet Take 1 tablet (10 mg total) by mouth daily., Starting Mon 11/23/2015, Normal    aspirin 81 MG tablet Take 81 mg by mouth daily. , Historical Med    cholecalciferol (VITAMIN D) 400 units TABS tablet Take 400 Units by mouth., Historical Med    glipiZIDE (GLUCOTROL XL) 5 MG 24 hr tablet Take 5 mg by mouth daily., Historical Med    isosorbide mononitrate (IMDUR) 60 MG 24 hr tablet TAKE ONE TABLET BY MOUTH DAILY., Normal    losartan (COZAAR) 50 MG tablet  Take 1 tablet (50 mg total) by mouth 2 (two) times daily., Starting Mon 11/23/2015, Normal    metoprolol (LOPRESSOR) 50 MG tablet TAKE ONE TABLET BY MOUTH TWICE DAILY, Normal       Allergies  Allergen Reactions  . Lisinopril Swelling   Follow-up Information    Deland Pretty, MD Follow up.   Specialty:  Internal Medicine Contact information: [address] [place] [address] [phone]            The results of significant diagnostics from this hospitalization (including imaging, microbiology, ancillary and laboratory) are listed below for reference.    Significant Diagnostic Studies: Ct Head Wo Contrast  Result Date: 09/20/2016 CLINICAL DATA:  Left arm and leg weakness since 7 p.m. EXAM: CT HEAD WITHOUT CONTRAST TECHNIQUE: Contiguous axial images were obtained from the base of the skull through the vertex without intravenous contrast. COMPARISON:  05/13/2010 FINDINGS: Brain: No evidence of acute infarction, hemorrhage, extra-axial collection, ventriculomegaly, or mass effect. Generalized cerebral atrophy. Periventricular white matter low attenuation likely secondary to microangiopathy. Vascular: Cerebrovascular atherosclerotic  calcifications are noted. Skull: Negative for fracture or focal lesion. Sinuses/Orbits: Visualized portions of the orbits are unremarkable. Visualized portions of the paranasal sinuses and mastoid air cells are unremarkable. Other: None. IMPRESSION: 1. No acute intracranial pathology. Electronically Signed   By: Kathreen Devoid   On: 09/20/2016 13:43   Mr Jodene Nam Neck Wo Contrast  Result Date: 09/20/2016 CLINICAL DATA:  83 y/o F; left arm and left leg weakness with decreased sensation. EXAM: MRI HEAD WITHOUT CONTRAST MRA HEAD WITHOUT CONTRAST MRA NECK WITHOUT CONTRAST TECHNIQUE: Multiplanar, multiecho pulse sequences of the brain and surrounding structures were obtained without intravenous contrast. Angiographic images of the Circle of Willis were obtained using MRA technique without intravenous contrast. Angiographic images of the neck were obtained using MRA technique without intravenous contrast. Carotid stenosis measurements (when applicable) are obtained utilizing NASCET criteria, using the distal internal carotid diameter as the denominator. COMPARISON:  09/20/2016 CT head FINDINGS: MRI HEAD FINDINGS Brain: 11 x 7 mm focus of reduced diffusion within the right posterior limb of internal capsule and lateral thalamus (series 4, image 24 in series 405, image 24) compatible with acute/ early subacute infarction. The infarction demonstrates T2 FLAIR hyperintense signal abnormality. There is no evidence for hemorrhage or mass effect. Patchy confluent nonspecific foci of T2 FLAIR hyperintense signal abnormality in subcortical and periventricular white matter is compatible with advanced chronic microvascular ischemic changes for age and there is diffuse brain parenchymal atrophy. Small cortical infarction is present in the right posterior frontal cortex and there is Leoma Folds chronic lacunar infarction within the left thalamus. Vascular: As below. Skull and upper cervical spine: Normal marrow signal. Sinuses/Orbits: No abnormal  signal of paranasal sinuses or mastoid air cells. Bilateral intra-ocular lens replacement. Other: None. MRA HEAD FINDINGS Anterior circulation: No large vessel occlusion, aneurysm, or significant stenosis is identified. Mild lumen irregularity of carotid siphons with mild distal left cavernous stenosis is compatible with atherosclerotic changes. Posterior circulation: Codominant vertebrobasilar system. No large vessel occlusion, aneurysm, or significant stenosis is identified. Anatomic variation: Diminutive posterior communicating arteries and large anterior communicating artery are present. MRA NECK FINDINGS Right carotid system: No aneurysm or hemodynamically significant stenosis by NASCET criteria is identified. Left carotid system: No aneurysm or hemodynamically significant stenosis by NASCET criteria is identified. Vertebrobasilar system: No dissection, aneurysm, or significant stenosis is identified. IMPRESSION: 1. 11 mm acute/early subacute infarction involving right posterior limb of internal capsule and  lateral thalamus. No acute hemorrhage or mass effect. 2. Advanced chronic microvascular ischemic changes and parenchymal volume loss of the brain. 3. Patent carotid and vertebral arteries. No aneurysm or hemodynamically significant stenosis utilizing NASCET criteria. 4. Patent circle of Willis. No large vessel occlusion, aneurysm, or significant stenosis. These results will be called to the ordering clinician or representative by the Radiologist Assistant, and communication documented in the PACS or zVision Dashboard. Electronically Signed   By: Kristine Garbe M.D.   On: 09/20/2016 22:43   Mr Brain Wo Contrast  Result Date: 09/20/2016 CLINICAL DATA:  83 y/o F; left arm and left leg weakness with decreased sensation. EXAM: MRI HEAD WITHOUT CONTRAST MRA HEAD WITHOUT CONTRAST MRA NECK WITHOUT CONTRAST TECHNIQUE: Multiplanar, multiecho pulse sequences of the brain and surrounding structures were  obtained without intravenous contrast. Angiographic images of the Circle of Willis were obtained using MRA technique without intravenous contrast. Angiographic images of the neck were obtained using MRA technique without intravenous contrast. Carotid stenosis measurements (when applicable) are obtained utilizing NASCET criteria, using the distal internal carotid diameter as the denominator. COMPARISON:  09/20/2016 CT head FINDINGS: MRI HEAD FINDINGS Brain: 11 x 7 mm focus of reduced diffusion within the right posterior limb of internal capsule and lateral thalamus (series 4, image 24 in series 405, image 24) compatible with acute/ early subacute infarction. The infarction demonstrates T2 FLAIR hyperintense signal abnormality. There is no evidence for hemorrhage or mass effect. Patchy confluent nonspecific foci of T2 FLAIR hyperintense signal abnormality in subcortical and periventricular white matter is compatible with advanced chronic microvascular ischemic changes for age and there is diffuse brain parenchymal atrophy. Small cortical infarction is present in the right posterior frontal cortex and there is Cobey Raineri chronic lacunar infarction within the left thalamus. Vascular: As below. Skull and upper cervical spine: Normal marrow signal. Sinuses/Orbits: No abnormal signal of paranasal sinuses or mastoid air cells. Bilateral intra-ocular lens replacement. Other: None. MRA HEAD FINDINGS Anterior circulation: No large vessel occlusion, aneurysm, or significant stenosis is identified. Mild lumen irregularity of carotid siphons with mild distal left cavernous stenosis is compatible with atherosclerotic changes. Posterior circulation: Codominant vertebrobasilar system. No large vessel occlusion, aneurysm, or significant stenosis is identified. Anatomic variation: Diminutive posterior communicating arteries and large anterior communicating artery are present. MRA NECK FINDINGS Right carotid system: No aneurysm or  hemodynamically significant stenosis by NASCET criteria is identified. Left carotid system: No aneurysm or hemodynamically significant stenosis by NASCET criteria is identified. Vertebrobasilar system: No dissection, aneurysm, or significant stenosis is identified. IMPRESSION: 1. 11 mm acute/early subacute infarction involving right posterior limb of internal capsule and lateral thalamus. No acute hemorrhage or mass effect. 2. Advanced chronic microvascular ischemic changes and parenchymal volume loss of the brain. 3. Patent carotid and vertebral arteries. No aneurysm or hemodynamically significant stenosis utilizing NASCET criteria. 4. Patent circle of Willis. No large vessel occlusion, aneurysm, or significant stenosis. These results will be called to the ordering clinician or representative by the Radiologist Assistant, and communication documented in the PACS or zVision Dashboard. Electronically Signed   By: Kristine Garbe M.D.   On: 09/20/2016 22:43   Mr Jodene Nam Head Wo Contrast  Result Date: 09/20/2016 CLINICAL DATA:  83 y/o F; left arm and left leg weakness with decreased sensation. EXAM: MRI HEAD WITHOUT CONTRAST MRA HEAD WITHOUT CONTRAST MRA NECK WITHOUT CONTRAST TECHNIQUE: Multiplanar, multiecho pulse sequences of the brain and surrounding structures were obtained without intravenous contrast. Angiographic images of the Circle of Willis were  obtained using MRA technique without intravenous contrast. Angiographic images of the neck were obtained using MRA technique without intravenous contrast. Carotid stenosis measurements (when applicable) are obtained utilizing NASCET criteria, using the distal internal carotid diameter as the denominator. COMPARISON:  09/20/2016 CT head FINDINGS: MRI HEAD FINDINGS Brain: 11 x 7 mm focus of reduced diffusion within the right posterior limb of internal capsule and lateral thalamus (series 4, image 24 in series 405, image 24) compatible with acute/ early subacute  infarction. The infarction demonstrates T2 FLAIR hyperintense signal abnormality. There is no evidence for hemorrhage or mass effect. Patchy confluent nonspecific foci of T2 FLAIR hyperintense signal abnormality in subcortical and periventricular white matter is compatible with advanced chronic microvascular ischemic changes for age and there is diffuse brain parenchymal atrophy. Small cortical infarction is present in the right posterior frontal cortex and there is Atwell Mcdanel chronic lacunar infarction within the left thalamus. Vascular: As below. Skull and upper cervical spine: Normal marrow signal. Sinuses/Orbits: No abnormal signal of paranasal sinuses or mastoid air cells. Bilateral intra-ocular lens replacement. Other: None. MRA HEAD FINDINGS Anterior circulation: No large vessel occlusion, aneurysm, or significant stenosis is identified. Mild lumen irregularity of carotid siphons with mild distal left cavernous stenosis is compatible with atherosclerotic changes. Posterior circulation: Codominant vertebrobasilar system. No large vessel occlusion, aneurysm, or significant stenosis is identified. Anatomic variation: Diminutive posterior communicating arteries and large anterior communicating artery are present. MRA NECK FINDINGS Right carotid system: No aneurysm or hemodynamically significant stenosis by NASCET criteria is identified. Left carotid system: No aneurysm or hemodynamically significant stenosis by NASCET criteria is identified. Vertebrobasilar system: No dissection, aneurysm, or significant stenosis is identified. IMPRESSION: 1. 11 mm acute/early subacute infarction involving right posterior limb of internal capsule and lateral thalamus. No acute hemorrhage or mass effect. 2. Advanced chronic microvascular ischemic changes and parenchymal volume loss of the brain. 3. Patent carotid and vertebral arteries. No aneurysm or hemodynamically significant stenosis utilizing NASCET criteria. 4. Patent circle of  Willis. No large vessel occlusion, aneurysm, or significant stenosis. These results will be called to the ordering clinician or representative by the Radiologist Assistant, and communication documented in the PACS or zVision Dashboard. Electronically Signed   By: Kristine Garbe M.D.   On: 09/20/2016 22:43    Microbiology: No results found for this or any previous visit (from the past 240 hour(s)).   Labs: Basic Metabolic Panel:  Recent Labs Lab 09/20/16 1406 09/20/16 1436 09/22/16 1039  NA 139 141 138  K 4.2 4.2 3.9  CL 109 109 107  CO2 22  --  23  GLUCOSE 205* 204* 247*  BUN 33* 35* 29*  CREATININE 2.04* 2.00* 1.86*  CALCIUM 10.0  --  9.9   Liver Function Tests:  Recent Labs Lab 09/20/16 1406  AST 17  ALT 14  ALKPHOS 63  BILITOT 0.8  PROT 6.9  ALBUMIN 3.5   No results for input(s): LIPASE, AMYLASE in the last 168 hours. No results for input(s): AMMONIA in the last 168 hours. CBC:  Recent Labs Lab 09/20/16 1406 09/20/16 1436 09/22/16 0431  WBC 7.1  --  5.7  NEUTROABS 5.1  --   --   HGB 10.4* 10.9* 10.1*  HCT 32.0* 32.0* 32.1*  MCV 88.6  --  88.4  PLT 226  --  232   Cardiac Enzymes: No results for input(s): CKTOTAL, CKMB, CKMBINDEX, TROPONINI in the last 168 hours. BNP: BNP (last 3 results) No results for input(s): BNP in the last 8760  hours.  ProBNP (last 3 results) No results for input(s): PROBNP in the last 8760 hours.  CBG:  Recent Labs Lab 09/21/16 2146 09/22/16 0641 09/22/16 1149 09/22/16 1624 09/22/16 2044  GLUCAP 174* 150* 220* 158* 234*       Signed:  Gulianna Hornsby Melven Sartorius MD.  [facility] 09/22/2016, 9:40 PM

## 2016-09-22 NOTE — H&P (Signed)
Physical Medicine and Rehabilitation Admission H&P         Chief Complaint  Patient presents with  . Extremity Weakness      left  : HPI: [name] is a 83 y.o. right handed female with history of CAD/CABG maintained on aspirin., diabetes mellitus, hypertension, CKD stage III. Per chart review patient lives with daughter. Independent prior to admission. She does not drive. Daughter works during the day. Presented 09/20/2016 with left-sided weakness. Cranial CT scan negative. Patient did not receive TPA. MRI of the brain showed 11 mm acute early subacute infarction involving right posterior limb of internal capsule and lateral thalamus. MRA with no large vessel occlusion or aneurysm. Echocardiogram with ejection fraction of 15% grade 1 diastolic dysfunction.  Currently maintained on Plavix for CVA prophylaxis. Subcutaneous heparin for DVT prophylaxis. Physical and occupational therapy evaluation pending. M.D. has requested physical medicine rehabilitation consult.Patient was admitted for a comprehensive rehabilitation program   Review of Systems  Constitutional: Negative for chills.  HENT: Negative for hearing loss.   Eyes: Negative for blurred vision and double vision.  Respiratory: Negative for cough.        Occasional shortness of breath with exertion  Cardiovascular: Positive for leg swelling. Negative for palpitations.       Angina  Gastrointestinal: Positive for constipation. Negative for nausea and vomiting.  Genitourinary: Negative for dysuria, flank pain and hematuria.  Musculoskeletal: Positive for joint pain and myalgias.  Skin: Negative for rash.  Neurological: Positive for sensory change and weakness. Negative for seizures.  All other systems reviewed and are negative.       Past Medical History:  Diagnosis Date  . Acute renal injury ([place]) 07/2016  . Anginal pain ([place]) 2004; 2011  . CAD (coronary artery disease)      status post stenting of the RCA status post CABG.  (left internal mammary artery to  LAD, saphenous vein graft to second diagonal, saphenous vein  graft to obtuse marginal 1, saphenous vein graft to posterior  descending).  . Cardiomyopathy       Mildly reduced EF (45%).   . Chronic kidney disease (CKD), stage III (moderate)    . History of blood transfusion 1960s    "related to anemia"  . HTN (hypertension)    . Myocardial infarction ([place]) 2011  . Pleural effusion 2011    S/P "bypass"  . Shortness of breath    . Stroke ([place]) 09/19/2016    "left sided weakness" (09/20/2016)  . Type II diabetes mellitus ([place])           Past Surgical History:  Procedure Laterality Date  . CATARACT EXTRACTION W/ INTRAOCULAR LENS  IMPLANT, BILATERAL Bilateral    . CORONARY ANGIOPLASTY WITH STENT PLACEMENT   2004    "LAD & one of the circumflex"  . CORONARY ARTERY BYPASS GRAFT   2011    CABG X4    History reviewed. No pertinent family history. Social History:  reports that she has never smoked. She has never used smokeless tobacco. She reports that she does not drink alcohol or use drugs. Allergies:      Allergies  Allergen Reactions  . Lisinopril Swelling          Medications Prior to Admission  Medication Sig Dispense Refill  . amLODipine (NORVASC) 10 MG tablet Take 1 tablet (10 mg total) by mouth daily. 90 tablet 3  . aspirin 81 MG tablet Take 81 mg by mouth daily.       Marland Kitchen  cholecalciferol (VITAMIN D) 400 units TABS tablet Take 400 Units by mouth.      Marland Kitchen glipiZIDE (GLUCOTROL XL) 5 MG 24 hr tablet Take 5 mg by mouth daily.      . isosorbide mononitrate (IMDUR) 60 MG 24 hr tablet TAKE ONE TABLET BY MOUTH DAILY. 30 tablet 6  . losartan (COZAAR) 50 MG tablet Take 1 tablet (50 mg total) by mouth 2 (two) times daily. 60 tablet 9  . metoprolol (LOPRESSOR) 50 MG tablet TAKE ONE TABLET BY MOUTH TWICE DAILY (Patient taking differently: TAKE ONE TABLET BY MOUTH DAILY) 180 tablet 2      Home: Home Living Family/patient expects to be discharged to::  Private residence Living Arrangements: Children Available Help at Discharge: Available PRN/intermittently Type of Home: House Home Access: Stairs to enter Technical brewer of Steps: 2 Entrance Stairs-Rails: None Home Layout: Multi-level Alternate Level Stairs-Number of Steps: 5 landing 5 Alternate Level Stairs-Rails: Right, Left, Can reach both Bathroom Shower/Tub: Tub/shower unit, Multimedia programmer: Standard Bathroom Accessibility: Yes Home Equipment: Environmental consultant - 2 wheels, Shower seat - built in, [employer] - tub/shower  Lives With: Daughter   Functional History: Prior Function Level of Independence: Independent Comments: Pt reports that she performed her ADLs and cooking tasks at home PTA. Pt states that she has a lady who comes in to clean and her daughter assists with IADLs as needed.   Functional Status:  Mobility: Bed Mobility Overal bed mobility: Needs Assistance Bed Mobility: Supine to Sit, Sit to Supine Supine to sit: Min guard, HOB elevated Sit to supine: Min guard, HOB elevated General bed mobility comments: Min Guard for safety. Pt able to pull up using bed rails and HOB elevated Transfers Overall transfer level: Needs assistance Equipment used: 2 person hand held assist Transfers: Sit to/from Stand, Stand Pivot Transfers Sit to Stand: Mod assist Stand pivot transfers: Mod assist General transfer comment: Mod A to steady in standing. Pt verbalizing fear of falling during transfers   ADL: ADL Overall ADL's : Needs assistance/impaired Eating/Feeding: Minimal assistance, Sitting Eating/Feeding Details (indicate cue type and reason): Pt reports dropping cups and utensils with LUE Grooming: Min guard, Sitting Upper Body Bathing: Moderate assistance, Sitting Lower Body Bathing: Maximal assistance, Sit to/from stand Upper Body Dressing : Moderate assistance Lower Body Dressing: Maximal assistance, Sit to/from stand Lower Body Dressing Details  (indicate cue type and reason): Pt requiring Max A to maitnain standing balance. Pt able to reach down and adjust socks while seated with Min Guard for safety.  Toilet Transfer: Moderate assistance, Stand-pivot (Simulated to recliner) Toilet Transfer Details (indicate cue type and reason): Performed stand pivot transfer to recliner. Pt requiring Mod A to maintain standing balance and turn   Cognition: Cognition Overall Cognitive Status: No family/caregiver present to determine baseline cognitive functioning Arousal/Alertness: Awake/alert Orientation Level: Oriented X4 Attention: Focused, Sustained Focused Attention: Appears intact Sustained Attention: Appears intact Memory: Appears intact Awareness: Appears intact Problem Solving: Appears intact Executive Function: Self Monitoring, Organizing Organizing: Appears intact Self Monitoring: Appears intact Safety/Judgment: Appears intact Cognition Arousal/Alertness: Awake/alert Behavior During Therapy: WFL for tasks assessed/performed Overall Cognitive Status: No family/caregiver present to determine baseline cognitive functioning General Comments: Noted some decreased problem solving and requiring increased time for tasks. Pt unable to talk while performing movements. Family not present to determine cogntitive baseline funciton   Physical Exam: Blood pressure (!) 160/64, pulse (!) 59, temperature 98.4 F (36.9 C), temperature source Oral, resp. rate 18, height 5' 3"  (1.6 m), weight  76.2 kg (168 lb), SpO2 96 %. Physical Exam  Vitals reviewed. Constitutional: She appears well-developed.  HENT:  Head: Normocephalic.  Eyes: EOM are normal.  Neck: Normal range of motion. Neck supple. No thyromegaly present.  Cardiovascular: Normal rate, regular rhythm and normal heart sounds.   Respiratory: Effort normal and breath sounds normal. No respiratory distress. She has no wheezes.  GI: Soft. Bowel sounds are normal. She exhibits no distension.  There is no tenderness.  Skin. Warm and dry Neurological. She is alert and oriented to person place and time. Makes good eye contact with examiner and follows commands. Fair awareness of deficits. Tongue is midline. Mild diminished fine finger movements on the left. Mild left grip weakness. Normal sensation.  2- Left delt bi tri grip 3- Left HF, KE ADF, 5/5 on Right side Sensation intact to LT BUE and BLE No dysarthria     Lab Results Last 48 Hours        Results for orders placed or performed during the hospital encounter of 09/20/16 (from the past 48 hour(s))  Ethanol     Status: None    Collection Time: 09/20/16  2:06 PM  Result Value Ref Range    Alcohol, Ethyl (B) <5 <5 mg/dL      Comment:        LOWEST DETECTABLE LIMIT FOR SERUM ALCOHOL IS 5 mg/dL FOR MEDICAL PURPOSES ONLY    Protime-INR     Status: None    Collection Time: 09/20/16  2:06 PM  Result Value Ref Range    Prothrombin Time 13.3 11.4 - 15.2 seconds    INR 1.01    APTT     Status: None    Collection Time: 09/20/16  2:06 PM  Result Value Ref Range    aPTT 29 24 - 36 seconds  CBC     Status: Abnormal    Collection Time: 09/20/16  2:06 PM  Result Value Ref Range    WBC 7.1 4.0 - 10.5 K/uL    RBC 3.61 (L) 3.87 - 5.11 MIL/uL    Hemoglobin 10.4 (L) 12.0 - 15.0 g/dL    HCT 32.0 (L) 36.0 - 46.0 %    MCV 88.6 78.0 - 100.0 fL    MCH 28.8 26.0 - 34.0 pg    MCHC 32.5 30.0 - 36.0 g/dL    RDW 13.8 11.5 - 15.5 %    Platelets 226 150 - 400 K/uL  Differential     Status: None    Collection Time: 09/20/16  2:06 PM  Result Value Ref Range    Neutrophils Relative % 73 %    Neutro Abs 5.1 1.7 - 7.7 K/uL    Lymphocytes Relative 17 %    Lymphs Abs 1.2 0.7 - 4.0 K/uL    Monocytes Relative 9 %    Monocytes Absolute 0.6 0.1 - 1.0 K/uL    Eosinophils Relative 1 %    Eosinophils Absolute 0.1 0.0 - 0.7 K/uL    Basophils Relative 0 %    Basophils Absolute 0.0 0.0 - 0.1 K/uL  Comprehensive metabolic panel     Status: Abnormal     Collection Time: 09/20/16  2:06 PM  Result Value Ref Range    Sodium 139 135 - 145 mmol/L    Potassium 4.2 3.5 - 5.1 mmol/L    Chloride 109 101 - 111 mmol/L    CO2 22 22 - 32 mmol/L    Glucose, Bld 205 (H) 65 - 99 mg/dL  BUN 33 (H) 6 - 20 mg/dL    Creatinine, Ser 2.04 (H) 0.44 - 1.00 mg/dL    Calcium 10.0 8.9 - 10.3 mg/dL    Total Protein 6.9 6.5 - 8.1 g/dL    Albumin 3.5 3.5 - 5.0 g/dL    AST 17 15 - 41 U/L    ALT 14 14 - 54 U/L    Alkaline Phosphatase 63 38 - 126 U/L    Total Bilirubin 0.8 0.3 - 1.2 mg/dL    GFR calc non Af Amer 21 (L) >60 mL/min    GFR calc Af Amer 24 (L) >60 mL/min      Comment: (NOTE) The eGFR has been calculated using the CKD EPI equation. This calculation has not been validated in all clinical situations. eGFR's persistently <60 mL/min signify possible Chronic Kidney Disease.      Anion gap 8 5 - 15  I-stat troponin, ED (not at [facility], [facility])     Status: None    Collection Time: 09/20/16  2:35 PM  Result Value Ref Range    Troponin i, poc 0.00 0.00 - 0.08 ng/mL    Comment 3               Comment: Due to the release kinetics of cTnI, a negative result within the first hours of the onset of symptoms does not rule out myocardial infarction with certainty. If myocardial infarction is still suspected, repeat the test at appropriate intervals.    I-Stat Chem 8, ED  (not at [facility], [facility])     Status: Abnormal    Collection Time: 09/20/16  2:36 PM  Result Value Ref Range    Sodium 141 135 - 145 mmol/L    Potassium 4.2 3.5 - 5.1 mmol/L    Chloride 109 101 - 111 mmol/L    BUN 35 (H) 6 - 20 mg/dL    Creatinine, Ser 2.00 (H) 0.44 - 1.00 mg/dL    Glucose, Bld 204 (H) 65 - 99 mg/dL    Calcium, Ion 1.23 1.15 - 1.40 mmol/L    TCO2 23 0 - 100 mmol/L    Hemoglobin 10.9 (L) 12.0 - 15.0 g/dL    HCT 32.0 (L) 36.0 - 46.0 %  Urine rapid drug screen (hosp performed)not at [facility]     Status: None    Collection Time: 09/20/16  3:30 PM  Result Value Ref Range    Opiates  NONE DETECTED NONE DETECTED    Cocaine NONE DETECTED NONE DETECTED    Benzodiazepines NONE DETECTED NONE DETECTED    Amphetamines NONE DETECTED NONE DETECTED    Tetrahydrocannabinol NONE DETECTED NONE DETECTED    Barbiturates NONE DETECTED NONE DETECTED      Comment:        DRUG SCREEN FOR MEDICAL PURPOSES ONLY.  IF CONFIRMATION IS NEEDED FOR ANY PURPOSE, NOTIFY LAB WITHIN 5 DAYS.        LOWEST DETECTABLE LIMITS FOR URINE DRUG SCREEN Drug Class       Cutoff (ng/mL) Amphetamine      1000 Barbiturate      200 Benzodiazepine   741 Tricyclics       287 Opiates          300 Cocaine          300 THC              50    Urinalysis, Routine w reflex microscopic     Status: Abnormal    Collection Time: 09/20/16  3:30 PM  Result Value Ref Range    Color, Urine YELLOW YELLOW    APPearance CLEAR CLEAR    Specific Gravity, Urine 1.010 1.005 - 1.030    pH 6.0 5.0 - 8.0    Glucose, UA 50 (A) NEGATIVE mg/dL    Hgb urine dipstick SMALL (A) NEGATIVE    Bilirubin Urine NEGATIVE NEGATIVE    Ketones, ur NEGATIVE NEGATIVE mg/dL    Protein, ur 100 (A) NEGATIVE mg/dL    Nitrite NEGATIVE NEGATIVE    Leukocytes, UA NEGATIVE NEGATIVE    RBC / HPF 0-5 0 - 5 RBC/hpf    WBC, UA 0-5 0 - 5 WBC/hpf    Bacteria, UA NONE SEEN NONE SEEN    Squamous Epithelial / LPF 0-5 (A) NONE SEEN  Glucose, capillary     Status: Abnormal    Collection Time: 09/20/16  8:11 PM  Result Value Ref Range    Glucose-Capillary 210 (H) 65 - 99 mg/dL    Comment 1 Notify RN      Comment 2 Document in Chart    Glucose, capillary     Status: Abnormal    Collection Time: 09/20/16 10:32 PM  Result Value Ref Range    Glucose-Capillary 210 (H) 65 - 99 mg/dL    Comment 1 Notify RN      Comment 2 Document in Chart    Hemoglobin A1c     Status: Abnormal    Collection Time: 09/20/16 10:52 PM  Result Value Ref Range    Hgb A1c MFr Bld 7.7 (H) 4.8 - 5.6 %      Comment: (NOTE) Pre diabetes:          5.7%-6.4% Diabetes:               >6.4% Glycemic control for   <7.0% adults with diabetes      Mean Plasma Glucose 174.29 mg/dL  APTT     Status: None    Collection Time: 09/20/16 10:52 PM  Result Value Ref Range    aPTT 30 24 - 36 seconds  Glucose, capillary     Status: Abnormal    Collection Time: 09/21/16  7:01 AM  Result Value Ref Range    Glucose-Capillary 146 (H) 65 - 99 mg/dL    Comment 1 Notify RN      Comment 2 Document in Chart    Lipid panel     Status: Abnormal    Collection Time: 09/21/16  7:28 AM  Result Value Ref Range    Cholesterol 167 0 - 200 mg/dL    Triglycerides 118 <150 mg/dL    HDL 42 >40 mg/dL    Total CHOL/HDL Ratio 4.0 RATIO    VLDL 24 0 - 40 mg/dL    LDL Cholesterol 101 (H) 0 - 99 mg/dL      Comment:        Total Cholesterol/HDL:CHD Risk Coronary Heart Disease Risk Table                     Men   Women  1/2 Average Risk   3.4   3.3  Average Risk       5.0   4.4  2 X Average Risk   9.6   7.1  3 X Average Risk  23.4   11.0        Use the calculated Patient Ratio above and the CHD Risk Table to determine the patient's CHD Risk.  ATP III CLASSIFICATION (LDL):  <100     mg/dL   Optimal  100-129  mg/dL   Near or Above                    Optimal  130-159  mg/dL   Borderline  160-189  mg/dL   High  >190     mg/dL   Very High    Glucose, capillary     Status: Abnormal    Collection Time: 09/21/16 12:12 PM  Result Value Ref Range    Glucose-Capillary 207 (H) 65 - 99 mg/dL    Comment 1 Notify RN      Comment 2 Document in Chart         Imaging Results (Last 48 hours)  Ct Head Wo Contrast   Result Date: 09/20/2016 CLINICAL DATA:  Left arm and leg weakness since 7 p.m. EXAM: CT HEAD WITHOUT CONTRAST TECHNIQUE: Contiguous axial images were obtained from the base of the skull through the vertex without intravenous contrast. COMPARISON:  05/13/2010 FINDINGS: Brain: No evidence of acute infarction, hemorrhage, extra-axial collection, ventriculomegaly, or mass effect.  Generalized cerebral atrophy. Periventricular white matter low attenuation likely secondary to microangiopathy. Vascular: Cerebrovascular atherosclerotic calcifications are noted. Skull: Negative for fracture or focal lesion. Sinuses/Orbits: Visualized portions of the orbits are unremarkable. Visualized portions of the paranasal sinuses and mastoid air cells are unremarkable. Other: None. IMPRESSION: 1. No acute intracranial pathology. Electronically Signed   By: Kathreen Devoid   On: 09/20/2016 13:43    Mr Jodene Nam Neck Wo Contrast   Result Date: 09/20/2016 CLINICAL DATA:  83 y/o F; left arm and left leg weakness with decreased sensation. EXAM: MRI HEAD WITHOUT CONTRAST MRA HEAD WITHOUT CONTRAST MRA NECK WITHOUT CONTRAST TECHNIQUE: Multiplanar, multiecho pulse sequences of the brain and surrounding structures were obtained without intravenous contrast. Angiographic images of the Circle of Willis were obtained using MRA technique without intravenous contrast. Angiographic images of the neck were obtained using MRA technique without intravenous contrast. Carotid stenosis measurements (when applicable) are obtained utilizing NASCET criteria, using the distal internal carotid diameter as the denominator. COMPARISON:  09/20/2016 CT head FINDINGS: MRI HEAD FINDINGS Brain: 11 x 7 mm focus of reduced diffusion within the right posterior limb of internal capsule and lateral thalamus (series 4, image 24 in series 405, image 24) compatible with acute/ early subacute infarction. The infarction demonstrates T2 FLAIR hyperintense signal abnormality. There is no evidence for hemorrhage or mass effect. Patchy confluent nonspecific foci of T2 FLAIR hyperintense signal abnormality in subcortical and periventricular white matter is compatible with advanced chronic microvascular ischemic changes for age and there is diffuse brain parenchymal atrophy. Small cortical infarction is present in the right posterior frontal cortex and there is a  chronic lacunar infarction within the left thalamus. Vascular: As below. Skull and upper cervical spine: Normal marrow signal. Sinuses/Orbits: No abnormal signal of paranasal sinuses or mastoid air cells. Bilateral intra-ocular lens replacement. Other: None. MRA HEAD FINDINGS Anterior circulation: No large vessel occlusion, aneurysm, or significant stenosis is identified. Mild lumen irregularity of carotid siphons with mild distal left cavernous stenosis is compatible with atherosclerotic changes. Posterior circulation: Codominant vertebrobasilar system. No large vessel occlusion, aneurysm, or significant stenosis is identified. Anatomic variation: Diminutive posterior communicating arteries and large anterior communicating artery are present. MRA NECK FINDINGS Right carotid system: No aneurysm or hemodynamically significant stenosis by NASCET criteria is identified. Left carotid system: No aneurysm or hemodynamically significant stenosis by NASCET criteria is identified.  Vertebrobasilar system: No dissection, aneurysm, or significant stenosis is identified. IMPRESSION: 1. 11 mm acute/early subacute infarction involving right posterior limb of internal capsule and lateral thalamus. No acute hemorrhage or mass effect. 2. Advanced chronic microvascular ischemic changes and parenchymal volume loss of the brain. 3. Patent carotid and vertebral arteries. No aneurysm or hemodynamically significant stenosis utilizing NASCET criteria. 4. Patent circle of Willis. No large vessel occlusion, aneurysm, or significant stenosis. These results will be called to the ordering clinician or representative by the Radiologist Assistant, and communication documented in the PACS or zVision Dashboard. Electronically Signed   By: Kristine Garbe M.D.   On: 09/20/2016 22:43    Mr Brain Wo Contrast   Result Date: 09/20/2016 CLINICAL DATA:  83 y/o F; left arm and left leg weakness with decreased sensation. EXAM: MRI HEAD WITHOUT  CONTRAST MRA HEAD WITHOUT CONTRAST MRA NECK WITHOUT CONTRAST TECHNIQUE: Multiplanar, multiecho pulse sequences of the brain and surrounding structures were obtained without intravenous contrast. Angiographic images of the Circle of Willis were obtained using MRA technique without intravenous contrast. Angiographic images of the neck were obtained using MRA technique without intravenous contrast. Carotid stenosis measurements (when applicable) are obtained utilizing NASCET criteria, using the distal internal carotid diameter as the denominator. COMPARISON:  09/20/2016 CT head FINDINGS: MRI HEAD FINDINGS Brain: 11 x 7 mm focus of reduced diffusion within the right posterior limb of internal capsule and lateral thalamus (series 4, image 24 in series 405, image 24) compatible with acute/ early subacute infarction. The infarction demonstrates T2 FLAIR hyperintense signal abnormality. There is no evidence for hemorrhage or mass effect. Patchy confluent nonspecific foci of T2 FLAIR hyperintense signal abnormality in subcortical and periventricular white matter is compatible with advanced chronic microvascular ischemic changes for age and there is diffuse brain parenchymal atrophy. Small cortical infarction is present in the right posterior frontal cortex and there is a chronic lacunar infarction within the left thalamus. Vascular: As below. Skull and upper cervical spine: Normal marrow signal. Sinuses/Orbits: No abnormal signal of paranasal sinuses or mastoid air cells. Bilateral intra-ocular lens replacement. Other: None. MRA HEAD FINDINGS Anterior circulation: No large vessel occlusion, aneurysm, or significant stenosis is identified. Mild lumen irregularity of carotid siphons with mild distal left cavernous stenosis is compatible with atherosclerotic changes. Posterior circulation: Codominant vertebrobasilar system. No large vessel occlusion, aneurysm, or significant stenosis is identified. Anatomic variation:  Diminutive posterior communicating arteries and large anterior communicating artery are present. MRA NECK FINDINGS Right carotid system: No aneurysm or hemodynamically significant stenosis by NASCET criteria is identified. Left carotid system: No aneurysm or hemodynamically significant stenosis by NASCET criteria is identified. Vertebrobasilar system: No dissection, aneurysm, or significant stenosis is identified. IMPRESSION: 1. 11 mm acute/early subacute infarction involving right posterior limb of internal capsule and lateral thalamus. No acute hemorrhage or mass effect. 2. Advanced chronic microvascular ischemic changes and parenchymal volume loss of the brain. 3. Patent carotid and vertebral arteries. No aneurysm or hemodynamically significant stenosis utilizing NASCET criteria. 4. Patent circle of Willis. No large vessel occlusion, aneurysm, or significant stenosis. These results will be called to the ordering clinician or representative by the Radiologist Assistant, and communication documented in the PACS or zVision Dashboard. Electronically Signed   By: Kristine Garbe M.D.   On: 09/20/2016 22:43    Mr Jodene Nam Head Wo Contrast   Result Date: 09/20/2016 CLINICAL DATA:  83 y/o F; left arm and left leg weakness with decreased sensation. EXAM: MRI HEAD WITHOUT CONTRAST MRA HEAD WITHOUT  CONTRAST MRA NECK WITHOUT CONTRAST TECHNIQUE: Multiplanar, multiecho pulse sequences of the brain and surrounding structures were obtained without intravenous contrast. Angiographic images of the Circle of Willis were obtained using MRA technique without intravenous contrast. Angiographic images of the neck were obtained using MRA technique without intravenous contrast. Carotid stenosis measurements (when applicable) are obtained utilizing NASCET criteria, using the distal internal carotid diameter as the denominator. COMPARISON:  09/20/2016 CT head FINDINGS: MRI HEAD FINDINGS Brain: 11 x 7 mm focus of reduced diffusion  within the right posterior limb of internal capsule and lateral thalamus (series 4, image 24 in series 405, image 24) compatible with acute/ early subacute infarction. The infarction demonstrates T2 FLAIR hyperintense signal abnormality. There is no evidence for hemorrhage or mass effect. Patchy confluent nonspecific foci of T2 FLAIR hyperintense signal abnormality in subcortical and periventricular white matter is compatible with advanced chronic microvascular ischemic changes for age and there is diffuse brain parenchymal atrophy. Small cortical infarction is present in the right posterior frontal cortex and there is a chronic lacunar infarction within the left thalamus. Vascular: As below. Skull and upper cervical spine: Normal marrow signal. Sinuses/Orbits: No abnormal signal of paranasal sinuses or mastoid air cells. Bilateral intra-ocular lens replacement. Other: None. MRA HEAD FINDINGS Anterior circulation: No large vessel occlusion, aneurysm, or significant stenosis is identified. Mild lumen irregularity of carotid siphons with mild distal left cavernous stenosis is compatible with atherosclerotic changes. Posterior circulation: Codominant vertebrobasilar system. No large vessel occlusion, aneurysm, or significant stenosis is identified. Anatomic variation: Diminutive posterior communicating arteries and large anterior communicating artery are present. MRA NECK FINDINGS Right carotid system: No aneurysm or hemodynamically significant stenosis by NASCET criteria is identified. Left carotid system: No aneurysm or hemodynamically significant stenosis by NASCET criteria is identified. Vertebrobasilar system: No dissection, aneurysm, or significant stenosis is identified. IMPRESSION: 1. 11 mm acute/early subacute infarction involving right posterior limb of internal capsule and lateral thalamus. No acute hemorrhage or mass effect. 2. Advanced chronic microvascular ischemic changes and parenchymal volume loss of  the brain. 3. Patent carotid and vertebral arteries. No aneurysm or hemodynamically significant stenosis utilizing NASCET criteria. 4. Patent circle of Willis. No large vessel occlusion, aneurysm, or significant stenosis. These results will be called to the ordering clinician or representative by the Radiologist Assistant, and communication documented in the PACS or zVision Dashboard. Electronically Signed   By: Kristine Garbe M.D.   On: 09/20/2016 22:43             Medical Problem List and Plan: 1.  Left hemiparesis secondary to right PLIC infarct 2.  DVT Prophylaxis/Anticoagulation: Subcutaneous heparin. Monitor platelet counts and any signs of bleeding 3. Pain Management: Tylenol as needed 4. Mood: Provide emotional support 5. Neuropsych: This patient is capable of making decisions on her own behalf. 6. Skin/Wound Care: Routine skin checks 7. Fluids/Electrolytes/Nutrition: Routine I&O with follow-up chemistries 8. Hypertension. Norvasc 10 mg daily, Cozaar 50 mg twice a day, Lopressor 50 mg daily, Imdur 60 mg daily. Monitor with increased mobility 9. Diabetes mellitus with peripheral neuropathy. Hemoglobin A1c 7.7. SSI. Patient on Glucotrol 5 mg daily prior to admission. Resume as needed 10. CAD with CABG. Continue Plavix. No chest pain or shortness of breath 11. CKD stage III. Creatinine 2.04 on admission. Follow-up chemistries 12. Hyperlipidemia. Lipitor   Post Admission Physician Evaluation: 1. Functional deficits secondary  to Left hemiparesis due to R PLIC infarct. 2. Patient is admitted to receive collaborative, interdisciplinary care between the physiatrist, rehab nursing staff,  and therapy team. 3. Patient's level of medical complexity and substantial therapy needs in context of that medical necessity cannot be provided at a lesser intensity of care such as a SNF. 4. Patient has experienced substantial functional loss from his/her baseline which was documented above under  the "Functional History" and "Functional Status" headings.  Judging by the patient's diagnosis, physical exam, and functional history, the patient has potential for functional progress which will result in measurable gains while on inpatient rehab.  These gains will be of substantial and practical use upon discharge  in facilitating mobility and self-care at the household level. 5. Physiatrist will provide 24 hour management of medical needs as well as oversight of the therapy plan/treatment and provide guidance as appropriate regarding the interaction of the two. 6. The Preadmission Screening has been reviewed and patient status is unchanged unless otherwise stated above. 7. 24 hour rehab nursing will assist with bladder management, bowel management, safety, skin/wound care, disease management, medication administration, pain management and patient education  and help integrate therapy concepts, techniques,education, etc. 8. PT will assess and treat for/with: pre gait, gait training, endurance , safety, equipment, neuromuscular re education.   Goals are: Mod I/Sup. 9. OT will assess and treat for/with: ADLs, Cognitive perceptual skills, Neuromuscular re education, safety, endurance, equipment.   Goals are: Mod I/Sup. Therapy may proceed with showering this patient. 10. SLP will assess and treat for/with: NA.  Goals are: NA. 11. Case Management and Social Worker will assess and treat for psychological issues and discharge planning. 12. Team conference will be held weekly to assess progress toward goals and to determine barriers to discharge. 13. Patient will receive at least 3 hours of therapy per day at least 5 days per week. 14. ELOS: 7-10d       15. Prognosis:  excellent         Charlett Blake M.D. [facility] FAAPM&R (Sports Med, Neuromuscular Med) Diplomate Am Board of Electrodiagnostic Med  Cathlyn Parsons., PA-C 09/21/2016

## 2016-09-22 NOTE — Progress Notes (Signed)
1820 09/22/16 nursing To rehab accompanied by RN and family per wheelchair alert and oriented patient with left sided weakness. Fall prevention  signed by patient. Oriented to unit set up.

## 2016-09-22 NOTE — Progress Notes (Signed)
Inpatient Diabetes Program Recommendations  AACE/ADA: New Consensus Statement on Inpatient Glycemic Control (2015)  Target Ranges:  Prepandial:   less than 140 mg/dL      Peak postprandial:   less than 180 mg/dL (1-2 hours)      Critically ill patients:  140 - 180 mg/dL   Consult: recent stroke with a1c 7.7 on glipizide 5mg  daily  Spoke with patient, son also in the room. Son reports patients A1c was 8.8% prior to admission due to increase on CHO intake at home. Sin reports patient watching what she eats after A1c level. Told patient and son current A1c level 7.7%, improved. Patient lives with daughter at home.   Due to patient's age and A1c level, do not recommend any other therapy or interventions other than what the patient is already doing. Patient also has a history of CABG. Risk of hypoglycemia as the A1c decreases at this point.   [name] is wondering when the Glipizide will be restarted.   Thanks,  Tama Headings RN, MSN, [facility] Inpatient Diabetes Coordinator Team Pager [phone] (8a-5p)

## 2016-09-22 NOTE — Progress Notes (Signed)
CSW alerted by Rehab Admission Coordinator that patient has received insurance approval for rehab admission when medically ready. No further CSW needs.  CSW signing off.  Laveda Abbe, [place] Clinical Social Worker [phone]

## 2016-09-22 NOTE — Progress Notes (Signed)
STROKE TEAM PROGRESS NOTE   HISTORY OF PRESENT ILLNESS (per record) [name] is an 83 y.o. female with history of CAD, diabetes, hypertension, hyperlipidemia presenting with left-sided weakness and decreased sensation on the left side.  Patient states that she was watching TV at 7 PM when she does that she had left arm and leg weakness. She also noted that she had left-sided decreased sensation. Due to the symptoms continuing today patient was brought to the emergency department. Patient states she does take an aspirin daily. Neuro was called for possible stroke. CT of head was negative for any acute bleed or infarct.  Date last known well: Date: 09/19/2016 Time last known well: Time: 19:00  Patient was not administered IV t-PA secondary to arriving outside of the treatment window. She was admitted to General Neurology for further evaluation and treatment.    SUBJECTIVE (INTERVAL HISTORY) No family at the bedside.  The patient is awake, alert, and follows all commands appropriately.      OBJECTIVE Temp:  [98.4 F (36.9 C)-98.6 F (37 C)] 98.6 F (37 C) (08/23 1043) Pulse Rate:  [59-76] 76 (08/23 1043) Cardiac Rhythm: Normal sinus rhythm (08/23 0700) Resp:  [16-18] 16 (08/23 1043) BP: (141-185)/(63-81) 185/81 (08/23 1043) SpO2:  [96 %-100 %] 99 % (08/23 1043)  CBC:   Recent Labs Lab 09/20/16 1406 09/20/16 1436 09/22/16 0431  WBC 7.1  --  5.7  NEUTROABS 5.1  --   --   HGB 10.4* 10.9* 10.1*  HCT 32.0* 32.0* 32.1*  MCV 88.6  --  88.4  PLT 226  --  027    Basic Metabolic Panel:   Recent Labs Lab 09/20/16 1406 09/20/16 1436 09/22/16 1039  NA 139 141 138  K 4.2 4.2 3.9  CL 109 109 107  CO2 22  --  23  GLUCOSE 205* 204* 247*  BUN 33* 35* 29*  CREATININE 2.04* 2.00* 1.86*  CALCIUM 10.0  --  9.9    Lipid Panel:     Component Value Date/Time   CHOL 167 09/21/2016 0728   TRIG 118 09/21/2016 0728   HDL 42 09/21/2016 0728   CHOLHDL 4.0 09/21/2016 0728   VLDL 24  09/21/2016 0728   LDLCALC 101 (H) 09/21/2016 0728   HgbA1c:  Lab Results  Component Value Date   HGBA1C 7.7 (H) 09/20/2016   Urine Drug Screen:     Component Value Date/Time   LABOPIA NONE DETECTED 09/20/2016 1530   COCAINSCRNUR NONE DETECTED 09/20/2016 1530   LABBENZ NONE DETECTED 09/20/2016 1530   AMPHETMU NONE DETECTED 09/20/2016 1530   THCU NONE DETECTED 09/20/2016 1530   LABBARB NONE DETECTED 09/20/2016 1530    Alcohol Level     Component Value Date/Time   ETH <5 09/20/2016 1406    IMAGING  Ct Head Wo Contrast 09/20/2016 IMPRESSION: 1. No acute intracranial pathology.  Mr Brain 19 Contrast Mr [facility] Wo Contrast Mr Jodene Nam Neck Wo Contrast 09/20/2016 IMPRESSION: 1. 11 mm acute/early subacute infarction involving right posterior limb of internal capsule and lateral thalamus. No acute hemorrhage or mass effect. 2. Advanced chronic microvascular ischemic changes and parenchymal volume loss of the brain. 3. Patent carotid and vertebral arteries. No aneurysm or hemodynamically significant stenosis utilizing NASCET criteria. 4. Patent circle of Willis. No large vessel occlusion, aneurysm, or significant stenosis.   TTE 09/21/2016 Study Conclusions - Left ventricle: The cavity size was normal. Wall thickness was increased in a pattern of mild LVH. Systolic function was normal.  The  estimated ejection fraction was in the range of 55% to 60%.  Wall motion was normal; there were no regional wall motion abnormalities. Doppler parameters are consistent with abnormal left ventricular relaxation (grade 1 diastolic dysfunction). - Mitral valve: There was mild regurgitation. - Left atrium: The atrium was mildly dilated. - Atrial septum: No defect or patent foramen ovale was identified. Impressions: - No cardiac source of emboli was indentified.   PHYSICAL EXAM Pleasant elderly lady not in distress. . Afebrile. Head is nontraumatic. Neck is supple without bruit.    Cardiac exam no  murmur or gallop. Lungs are clear to auscultation. Distal pulses are well felt. Neurological Exam :  Awake alert oriented x 3 normal speech and language except for mild dysarthria. Mild left lower face asymmetry. Tongue midline. No drift. Mild diminished fine finger movements on left. Orbits right over left upper extremity. Mild left grip weak.. Normal sensation . Normal coordination.  ASSESSMENT/PLAN [name] is a 83 y.o. female with history of CAD, diabetes, hypertension, hyperlipidemia presenting with left-sided weakness and decreased sensation on the left side. She did not receive IV t-PA due to arriving outside of the treatment window.   Stroke: 11 mm acute/early subacute infarction involving right posterior limb of internal capsule and lateral thalamus due to small vessel disease  Resultant left hemiplegia  CT head: no acute stroke  MRI head: 11 mm acute/early subacute infarction involving right posterior limb of internal capsule and lateral thalamus  MRA head: No LVO or high-grade stenosis  MRA neck: No LVO or high-grade stenosis  2D Echo: EF 55-60%. No source of embolus   LDL 101  HgbA1c 7.7  Heparin 5,000 units [place] every 8 hours for VTE prophylaxis Diet heart healthy/carb modified Room service appropriate? Yes; Fluid consistency: Thin  aspirin 162 mg daily prior to admission, now on aspirin 325 mg daily and clopidogrel 75 mg daily  Patient counseled to be compliant with her antithrombotic medications  Ongoing aggressive stroke risk factor management  Therapy recommendations: OT recommends CIR.  Seen by Rehab for admission contingent on PT recommendations.  PT consult pending. Disposition:  CLR Hypertension  Stable  Permissive hypertension (OK if < 220/120) but gradually normalize in 5-7 days  Long-term BP goal normotensive  Hyperlipidemia  Home meds: none  LDL 101, goal < 70  Add atorvastatin 40 mg PO daily  Continue statin at  discharge  Diabetes  HgbA1c 7.7, goal < 7.0  Uncontrolled  Diabetes Coordinator consulted  Other Stroke Risk Factors  Advanced age  Obesity, Body mass index is 29.76 kg/m., recommend weight loss, diet and exercise as appropriate   Other Active Problems  None  Hospital day # 2  I have personally examined this patient, reviewed notes, independently viewed imaging studies, participated in medical decision making and plan of care.ROS completed by me personally and pertinent positives fully documented  I have made any additions or clarifications directly to the above note.  She presented with left facial weakness and slurred speech secondary to small right internal capsule infarct from small vessel disease. Continue Plavix for stroke prevention. Physical occupational therapy and rehabilitation consults.   Long discussion with the daughter at the bedside and answered questions. Check transthoracic echocardiogram. Stroke team will sign off. Follow-up as an outpatient in stroke clinic in 6 weeks. Kindly call for questions if any. Discussed with Dr Florene Glen.Antony Contras, MD Medical Director [place] Pager: [phone] 09/22/2016 1:31 PM   To contact Stroke Continuity provider, please  refer to [email or web address] After hours, contact General Neurology

## 2016-09-22 NOTE — Progress Notes (Signed)
Report given to [facility] on 4MW. RN to transfer patient to the floor after patient eats dinner. Neuro assessment unchanged

## 2016-09-23 ENCOUNTER — Inpatient Hospital Stay (HOSPITAL_COMMUNITY): Payer: Medicare Other | Admitting: Occupational Therapy

## 2016-09-23 ENCOUNTER — Inpatient Hospital Stay (HOSPITAL_COMMUNITY): Payer: Medicare Other | Admitting: Physical Therapy

## 2016-09-23 DIAGNOSIS — I999 Unspecified disorder of circulatory system: Secondary | ICD-10-CM

## 2016-09-23 LAB — COMPREHENSIVE METABOLIC PANEL
ALBUMIN: 3.2 g/dL — AB (ref 3.5–5.0)
ALT: 14 U/L (ref 14–54)
ANION GAP: 10 (ref 5–15)
AST: 17 U/L (ref 15–41)
Alkaline Phosphatase: 57 U/L (ref 38–126)
BILIRUBIN TOTAL: 0.6 mg/dL (ref 0.3–1.2)
BUN: 28 mg/dL — ABNORMAL HIGH (ref 6–20)
CO2: 21 mmol/L — AB (ref 22–32)
Calcium: 10 mg/dL (ref 8.9–10.3)
Chloride: 110 mmol/L (ref 101–111)
Creatinine, Ser: 2.07 mg/dL — ABNORMAL HIGH (ref 0.44–1.00)
GFR calc Af Amer: 24 mL/min — ABNORMAL LOW (ref >60)
GFR calc non Af Amer: 21 mL/min — ABNORMAL LOW (ref >60)
GLUCOSE: 186 mg/dL — AB (ref 65–99)
POTASSIUM: 3.9 mmol/L (ref 3.5–5.1)
SODIUM: 141 mmol/L (ref 135–145)
TOTAL PROTEIN: 6.9 g/dL (ref 6.5–8.1)

## 2016-09-23 LAB — CBC WITH DIFFERENTIAL/PLATELET
BASOS PCT: 1 %
Basophils Absolute: 0 10*3/uL (ref 0.0–0.1)
EOS ABS: 0.2 10*3/uL (ref 0.0–0.7)
EOS PCT: 3 %
HCT: 33.6 % — ABNORMAL LOW (ref 36.0–46.0)
Hemoglobin: 10.9 g/dL — ABNORMAL LOW (ref 12.0–15.0)
Lymphocytes Relative: 32 %
Lymphs Abs: 1.8 10*3/uL (ref 0.7–4.0)
MCH: 28.9 pg (ref 26.0–34.0)
MCHC: 32.4 g/dL (ref 30.0–36.0)
MCV: 89.1 fL (ref 78.0–100.0)
Monocytes Absolute: 0.6 10*3/uL (ref 0.1–1.0)
Monocytes Relative: 10 %
Neutro Abs: 3 10*3/uL (ref 1.7–7.7)
Neutrophils Relative %: 54 %
PLATELETS: 218 10*3/uL (ref 150–400)
RBC: 3.77 MIL/uL — AB (ref 3.87–5.11)
RDW: 14 % (ref 11.5–15.5)
WBC: 5.6 10*3/uL (ref 4.0–10.5)

## 2016-09-23 LAB — GLUCOSE, CAPILLARY
GLUCOSE-CAPILLARY: 225 mg/dL — AB (ref 65–99)
GLUCOSE-CAPILLARY: 208 mg/dL — AB (ref 65–99)
GLUCOSE-CAPILLARY: 182 mg/dL — AB (ref 65–99)
Glucose-Capillary: 190 mg/dL — ABNORMAL HIGH (ref 65–99)

## 2016-09-23 MED ORDER — GLIPIZIDE 2.5 MG HALF TABLET
2.5000 mg | ORAL_TABLET | Freq: Every day | ORAL | Status: DC
  Administered 2016-09-24 – 2016-09-26 (×3): 2.5 mg via ORAL
  Filled 2016-09-23 (×3): qty 1

## 2016-09-23 NOTE — Progress Notes (Signed)
Social Work Assessment and Plan Social Work Assessment and Plan  Patient Details  Name: [name] MRN: [id number] Date of Birth: 02/02/1927  Today's Date: 09/23/2016  Problem List:  Patient Active Problem List   Diagnosis Date Noted  . Small vessel disease 09/22/2016  . Hemiparesis affecting left side as late effect of stroke ([place]) 09/22/2016  . Gait disturbance, post-stroke 09/22/2016  . Stroke (cerebrum) ([place]) 09/20/2016  . Diabetes mellitus with complication ([place])   . Ischemic stroke ([place])   . Status post intraocular lens implant 06/23/2011  . Glaucoma suspect 12/30/2010  . Macular hole 12/30/2010  . Nonproliferative diabetic retinopathy ([place]) 12/30/2010  . Syncope 06/17/2010  . CARDIOMYOPATHY 04/15/2010  . PLEURAL EFFUSION 04/14/2010  . SHORTNESS OF BREATH 04/14/2010  . CORONARY ATHEROSCLEROSIS NATIVE CORONARY ARTERY 01/19/2009  . Hyperlipidemia 01/06/2009  . Diabetes mellitus ([place]) 01/05/2009  . Essential hypertension 01/05/2009  . Coronary atherosclerosis 01/05/2009   Past Medical History:  Past Medical History:  Diagnosis Date  . Acute renal injury ([place]) 07/2016  . Anginal pain ([place]) 2004; 2011  . CAD (coronary artery disease)    status post stenting of the RCA status post CABG. (left internal mammary artery to  LAD, saphenous vein graft to second diagonal, saphenous vein  graft to obtuse marginal 1, saphenous vein graft to posterior  descending).  . Cardiomyopathy     Mildly reduced EF (45%).   . Chronic kidney disease (CKD), stage III (moderate)   . History of blood transfusion 1960s   "related to anemia"  . HTN (hypertension)   . Myocardial infarction ([place]) 2011  . Pleural effusion 2011   S/P "bypass"  . Shortness of breath   . Stroke ([place]) 09/19/2016   "left sided weakness" (09/20/2016)  . Type II diabetes mellitus ([place])    Past Surgical History:  Past Surgical History:  Procedure Laterality Date  . CATARACT EXTRACTION W/ INTRAOCULAR LENS  IMPLANT,  BILATERAL Bilateral   . CORONARY ANGIOPLASTY WITH STENT PLACEMENT  2004   "LAD & one of the circumflex"  . CORONARY ARTERY BYPASS GRAFT  2011   CABG X4   Social History:  reports that she has never smoked. She has never used smokeless tobacco. She reports that she does not drink alcohol or use drugs.  Family / Support Systems Marital Status: Widow/Widower Patient Roles: Parent, Other (Comment) (Aunt) Children: Lynn-daughter [phone] Other Supports: [place] and nephew who lives with she and daughter Anticipated Caregiver: Daughter and nephew, son coming for one month at [place] Ability/Limitations of Caregiver: Daughter works days and nephew works nights. Son supposely coming from [place] for one month Caregiver Availability: 24/7 (short time with son here) Family Dynamics: Close knit family both children will do what she needs for her care. Son is taking one month off to come down and be with pt. Pt feels very blessed to have both of them and her nephew who are willing to assist her.  Social History Preferred language: English Religion: Presbyterian Cultural Background: No issues Education: [employer] Read: Yes Write: Yes Employment Status: Retired Freight forwarder Issues: No issues Guardian/Conservator: None-according to MD pt is capable of making her own decision while here. Her daughter comes every evening to see her   Abuse/Neglect Physical Abuse: Denies Verbal Abuse: Denies Sexual Abuse: Denies Exploitation of patient/patient's resources: Denies Self-Neglect: Denies  Emotional Status Pt's affect, behavior adn adjustment status: Pt is motivated and feels her first day went very well. She wants to get back to  being able to be independent and taking care of herself. She has dealt with health issues in the past and has done well and she is hopeful she will do well again. Recent Psychosocial Issues: other health issues managed by her  PCP Pyschiatric History: No history deferred depression screen due to coping well and adjusting to the new unit and very glad to be here. Will monitor while here and have neuro-psych see if warranted. Substance Abuse History: No issues  Patient / Family Perceptions, Expectations & Goals Pt/Family understanding of illness & functional limitations: Pt and daughter can explain her stroke and deficits. Both have spoken with the MD and feel their questions have been answered and addressed. Pt feels she needs to get stronger while here. Premorbid pt/family roles/activities: Mom, aunt, retiree, home owner, church member, etc Anticipated changes in roles/activities/participation: resume Pt/family expectations/goals: Pt states: " I want to take care of myself by the time I leave here."  Daughter states: " I am enocuraged she has already made progress since she came to the hospital."  [employer]: Other (Comment) (been to [facility] before 2011 & 2012) Premorbid Home Care/DME Agencies: Other (Comment) (has rw, tub seat and bsc) Transportation available at discharge: family members Resource referrals recommended: Support group (specify)  Discharge Planning Living Arrangements: Children, Other relatives Support Systems: Children, Other relatives, Friends/neighbors, Church/faith community Type of Residence: Private residence Insurance Resources: Multimedia programmer (specify) Primary school teacher) Financial Resources: Fish farm manager, Family Support Financial Screen Referred: No Living Expenses: Own Money Management: Patient Does the patient have any problems obtaining your medications?: No Home Management: All of them-mostly pt and daughter-has cleaning person Patient/Family Preliminary Plans: Return home with daughter and nephew. She will have 24 hr care for a short time with her son coming to stay for one month with her. Her daughter works days and her nephew works nights, so  between them she will almost have 24 hr care once son leaves if this is needed. Social Work Anticipated Follow Up Needs: HH/OP, Support Group  Clinical Impression Pleasant female who is encouraged by her first day of therapies with how well she has done. She is motivated to recover form this stroke and become independent again. Her family is involved and supportive and willing to Assist her at discharge. Her son to come down form [place] for one month when she is discharged. Will await team's evaluations and work on discharge plans.  [name] 09/23/2016, 1:19 PM

## 2016-09-23 NOTE — Progress Notes (Signed)
Patient information reviewed and entered into eRehab system by Sreshta Cressler, RN, CRRN, PPS Coordinator.  Information including medical coding and functional independence measure will be reviewed and updated through discharge.     Per nursing patient was given "Data Collection Information Summary for Patients in Inpatient Rehabilitation Facilities with attached "Privacy Act Statement-Health Care Records" upon admission.  

## 2016-09-23 NOTE — Evaluation (Signed)
Occupational Therapy Assessment and Plan  Patient Details  Name: [name] MRN: [id number] Date of Birth: [birth year]  OT Diagnosis: hemiplegia affecting non-dominant side and muscle weakness (generalized) Rehab Potential: Rehab Potential (ACUTE ONLY): Good ELOS: 10-14 days    Today's Date: 09/23/2016 OT Individual Time: 0900-1015 OT Individual Time Calculation (min): 75 min     Problem List:  Patient Active Problem List   Diagnosis Date Noted  . Small vessel disease 09/22/2016  . Hemiparesis affecting left side as late effect of stroke ([place]) 09/22/2016  . Gait disturbance, post-stroke 09/22/2016  . Stroke (cerebrum) ([place]) 09/20/2016  . Diabetes mellitus with complication ([place])   . Ischemic stroke ([place])   . Status post intraocular lens implant 06/23/2011  . Glaucoma suspect 12/30/2010  . Macular hole 12/30/2010  . Nonproliferative diabetic retinopathy ([place]) 12/30/2010  . Syncope 06/17/2010  . CARDIOMYOPATHY 04/15/2010  . PLEURAL EFFUSION 04/14/2010  . SHORTNESS OF BREATH 04/14/2010  . CORONARY ATHEROSCLEROSIS NATIVE CORONARY ARTERY 01/19/2009  . Hyperlipidemia 01/06/2009  . Diabetes mellitus ([place]) 01/05/2009  . Essential hypertension 01/05/2009  . Coronary atherosclerosis 01/05/2009    Past Medical History:  Past Medical History:  Diagnosis Date  . Acute renal injury ([place]) 07/2016  . Anginal pain ([place]) 2004; 2011  . CAD (coronary artery disease)    status post stenting of the RCA status post CABG. (left internal mammary artery to  LAD, saphenous vein graft to second diagonal, saphenous vein  graft to obtuse marginal 1, saphenous vein graft to posterior  descending).  . Cardiomyopathy     Mildly reduced EF (45%).   . Chronic kidney disease (CKD), stage III (moderate)   . History of blood transfusion 1960s   "related to anemia"  . HTN (hypertension)   . Myocardial infarction ([place]) 2011  . Pleural effusion 2011   S/P "bypass"  . Shortness of breath   . Stroke ([place])  09/19/2016   "left sided weakness" (09/20/2016)  . Type II diabetes mellitus ([place])    Past Surgical History:  Past Surgical History:  Procedure Laterality Date  . CATARACT EXTRACTION W/ INTRAOCULAR LENS  IMPLANT, BILATERAL Bilateral   . CORONARY ANGIOPLASTY WITH STENT PLACEMENT  2004   "LAD & one of the circumflex"  . CORONARY ARTERY BYPASS GRAFT  2011   CABG X4    Assessment & Plan Clinical Impression: Patient is a 83 y.o.right handed femalewith history of CAD/CABGmaintained on aspirin., diabetes mellitus, hypertension, CKD stage III.Per chart review patient lives with daughter. Independent prior to admission. She does not drive. Daughter works during the day.Presented 09/20/2016 with left-sided weakness. Cranial CT scan negative. Patient did not receive TPA. MRI of the brain showed 11 mm acute early subacute infarction involving right posterior limb of internal capsule and lateral thalamus.  Patient transferred to CIR on 09/22/2016 .    Patient currently requires mod with basic self-care skills secondary to muscle weakness, impaired timing and sequencing, unbalanced muscle activation, decreased coordination and decreased motor planning, decreased attention to left and left side neglect, and decreased standing balance, decreased postural control, hemiplegia and decreased balance strategies.  Prior to hospitalization, patient could complete ADLs with independent .  Patient will benefit from skilled intervention to increase independence with basic self-care skills prior to discharge home with care partner.  Anticipate patient will require intermittent supervision and follow up home health.  OT - End of Session Activity Tolerance: Tolerates 30+ min activity with multiple rests Endurance Deficit: Yes OT Assessment Rehab Potential (ACUTE ONLY): Good  OT Barriers to Discharge: Decreased caregiver support;Home environment access/layout;Lack of/limited family support OT Barriers to Discharge  Comments: Pt lives at home with her daughter in a two story home. Her bedroom is located on the second story and her walk in shower is located upstairs. In the past, her family has set up a room for her downstairs from previous case. Pt noted that her daughter is available before and after working hours to provide assistance. Goal for pt to return home at mod I-S level.  OT Patient demonstrates impairments in the following area(s): Balance;Safety;Cognition;Endurance;Motor OT Basic ADL's Functional Problem(s): Eating;Grooming;Bathing;Dressing;Toileting OT Advanced ADL's Functional Problem(s): Simple Meal Preparation OT Transfers Functional Problem(s): Toilet;Tub/Shower OT Additional Impairment(s): Fuctional Use of Upper Extremity OT Plan OT Intensity: Minimum of 1-2 x/day, 45 to 90 minutes OT Frequency: 5 out of 7 days OT Duration/Estimated Length of Stay: 10-14 days  OT Treatment/Interventions: Neuromuscular re-education;Patient/family education;Self Care/advanced ADL retraining;Therapeutic Exercise;UE/LE Coordination activities;Balance/vestibular training;Discharge planning;DME/adaptive equipment instruction;Functional mobility training;Therapeutic Activities;UE/LE Strength taining/ROM OT Self Feeding Anticipated Outcome(s): IND OT Basic Self-Care Anticipated Outcome(s): mod I- Supervision OT Toileting Anticipated Outcome(s): mod I- Supervision OT Bathroom Transfers Anticipated Outcome(s): mod I- Supervion OT Recommendation Recommendations for Other Services: Vestibular eval Patient destination: Home Follow Up Recommendations: Home health OT Equipment Recommended: To be determined Equipment Details: pt has a walker at home, built in shower seat in shower and long handled sponge in bath    Skilled Therapeutic Intervention Evaluation & chart review completed. Pt supine in bed and agreeable to OT session. Therapist provided pt with hemiplegic techniques for bed mobility, transfers, and d/b. Pt  completed bed mob and sit<>stand tx with mod A w/ Generalized weakness and decreased coordination in L UE and LE. Pt completed toileting task with mod A. Pt agreed to shower at seated level. Pt completed task with t/c for use of L UE. Pt completed dressing tasks at w/c level with instruction on hemi-dressing techniques. Pt demonstrated dressing with min A and v/c. Therapist provided pt with encouragement and explained prognosis of stroke. Pt provided with wheelchair cushion and repositioned self in w/c. Pt denied pain but reported fatigue. Pt was left resting in room with call bell in reach.   OT Evaluation Precautions/Restrictions  Precautions Precautions: Fall Precaution Comments: left sided weakness Restrictions Weight Bearing Restrictions: No General Chart Reviewed: Yes Additional Pertinent History:  CAD/CABG maintained on aspirin., diabetes mellitus, hypertension, CKD stage III Response to Previous Treatment: Patient reporting fatigue but able to participate Family/Caregiver Present: No Vital Signs  Pain Pain Assessment Pain Assessment: No/denies pain Home Living/Prior Functioning Home Living Family/patient expects to be discharged to:: Private residence Living Arrangements: Children Available Help at Discharge: Available PRN/intermittently Type of Home: House Home Access: Stairs to enter Technical brewer of Steps: 2 Entrance Stairs-Rails: None Home Layout: Multi-level Alternate Level Stairs-Number of Steps: 5 landing 5 Alternate Level Stairs-Rails: Right, Left, Can reach both Bathroom Shower/Tub: Tub/shower unit, Multimedia programmer: Associate Professor Accessibility: Yes  Lives With: Daughter Prior Function Level of Independence: Independent with basic ADLs Vocation: Retired Comments: PTA pt was IND with BADLs and occassional IADLs including laundry and cooking and driving. Pt reports she has a cleaner in the house.  ADL ADL Eating: Set up Where  Assessed-Eating: Bed level Grooming: Minimal assistance Where Assessed-Grooming: Sitting at sink Upper Body Bathing: Minimal assistance Where Assessed-Upper Body Bathing: Shower Lower Body Bathing: Maximal assistance Where Assessed-Lower Body Bathing: Shower Upper Body Dressing: Maximal cueing, Minimal assistance Where Assessed-Upper Body Dressing: Sitting at  sink Lower Body Dressing: Maximal cueing, Moderate assistance Where Assessed-Lower Body Dressing: Sitting at sink Toileting: Moderate assistance, Moderate cueing Where Assessed-Toileting: Bedside Commode Toilet Transfer: Moderate assistance Toilet Transfer Method: Stand pivot Toilet Transfer Equipment: Bedside commode Tub/Shower Transfer: Unable to assess Tub/Shower Transfer Method: Unable to assess Social research officer, government: Moderate assistance, Moderate cueing Social research officer, government Method: Radiographer, therapeutic: Grab bars, Shower seat with back ADL Comments: Pt demonstrates fear of falling and needs reminders to use L side  Vision Baseline Vision/History: Wears glasses Patient Visual Report: Other (comment) (no glasses in hosp) Vision Assessment?: No apparent visual deficits Perception  Perception: Within Functional Limits Praxis Praxis: Intact Cognition Overall Cognitive Status: No family/caregiver present to determine baseline cognitive functioning Arousal/Alertness: Awake/alert Orientation Level: Person;Place;Situation Person: Oriented Place: Oriented Situation: Oriented Year: 2018 Month: August Day of Week: Correct Memory: Appears intact Immediate Memory Recall: Sock;Blue;Bed Memory Recall: Sock;Blue;Bed Memory Recall Sock: Without Cue Memory Recall Blue: Without Cue Memory Recall Bed: Without Cue Attention: Focused;Sustained Focused Attention: Appears intact Sustained Attention: Appears intact Awareness: Appears intact Problem Solving: Appears intact Executive Function: Self  Monitoring;Organizing Organizing: Appears intact Self Monitoring: Appears intact Safety/Judgment: Appears intact Sensation Sensation Light Touch: Appears Intact Stereognosis: Appears Intact Hot/Cold: Appears Intact Proprioception: Appears Intact Coordination Gross Motor Movements are Fluid and Coordinated: No Fine Motor Movements are Fluid and Coordinated: No Coordination and Movement Description: L sided weakness and decreased GMC/FMC during functional tasks  Motor  Motor Motor: Hemiplegia Motor - Skilled Clinical Observations: L side with mild hemiplegia, noted weakness in hand grip/finger dexterity limiting function use  Mobility  Bed Mobility Bed Mobility: Supine to Sit;Sitting - Scoot to Edge of Bed Supine to Sit: 3: Mod assist Supine to Sit Details: Tactile cues for sequencing;Tactile cues for placement;Verbal cues for technique;Verbal cues for precautions/safety Supine to Sit Details (indicate cue type and reason): v/c for hand and foot placement using bed rails  Sitting - Scoot to Edge of Bed: 4: Min assist Sitting - Scoot to Edge of Bed Details: Tactile cues for initiation;Tactile cues for weight shifting;Verbal cues for technique Sitting - Scoot to Edge of Bed Details (indicate cue type and reason): demonstration for technique and tactile cues for initiation Transfers Transfers: Sit to Stand Sit to Stand: 3: Mod assist Sit to Stand Details: Verbal cues for technique;Verbal cues for precautions/safety;Tactile cues for initiation Sit to Stand Details (indicate cue type and reason): safety awareness   Trunk/Postural Assessment  Cervical Assessment Cervical Assessment: Within Functional Limits Thoracic Assessment Thoracic Assessment: Within Functional Limits Lumbar Assessment Lumbar Assessment: Within Functional Limits Postural Control Postural Control: Deficits on evaluation Protective Responses: d/t decreased coordination  Balance Balance Balance Assessed:  Yes Static Sitting Balance Static Sitting - Balance Support: Feet supported Static Sitting - Level of Assistance: 5: Stand by assistance Dynamic Sitting Balance Dynamic Sitting - Balance Support: Feet supported;During functional activity Dynamic Sitting - Level of Assistance: 5: Stand by assistance Dynamic Sitting Balance - Compensations: noted R sided lean when compensating  Dynamic Sitting - Balance Activities: Other (comment) (ADLs) Static Standing Balance Static Standing - Balance Support: Right upper extremity supported Static Standing - Level of Assistance: 4: Min assist Dynamic Standing Balance Dynamic Standing - Balance Support: Right upper extremity supported Dynamic Standing - Level of Assistance: 3: Mod assist Dynamic Standing - Balance Activities: Other (comment) (During ADLs) Dynamic Standing - Comments: Pt with L LE weakness demonstrated during standing ADLs, needs verbal reminders for upright posture Extremity/Trunk Assessment RUE Assessment RUE Assessment:  Within Functional Limits LUE Assessment LUE Assessment: Exceptions to [facility] LUE AROM (degrees) Overall AROM Left Upper Extremity: Deficits;Other (comment) (limited AROm of digits, wrist, elbow, and shoulder d/t muscle weakness) LUE PROM (degrees) Overall PROM Left Upper Extremity: Within functional limits for tasks assessed LUE Strength LUE Overall Strength: Deficits;Other (Comment) (decreased L UE stength, impaired awareness of ability, required v/c for L UE engagement )   See Function Navigator for Current Functional Status.   Refer to Care Plan for Long Term Goals  Recommendations for other services: Neuropsych   Discharge Criteria: Patient will be discharged from OT if patient refuses treatment 3 consecutive times without medical reason, if treatment goals not met, if there is a change in medical status, if patient makes no progress towards goals or if patient is discharged from hospital.  The above  assessment, treatment plan, treatment alternatives and goals were discussed and mutually agreed upon: by patient  Delon [place] 09/23/2016, 10:59 AM

## 2016-09-23 NOTE — Progress Notes (Signed)
Subjective/Complaints:   Objective: Vital Signs: Blood pressure (!) 163/88, pulse 92, temperature 99 F (37.2 C), temperature source Oral, resp. rate 18, height 5' 3"  (1.6 m), weight 76.7 kg (169 lb 3.2 oz), SpO2 100 %. No results found. Results for orders placed or performed during the hospital encounter of 09/22/16 (from the past 72 hour(s))  Glucose, capillary     Status: Abnormal   Collection Time: 09/22/16  8:44 PM  Result Value Ref Range   Glucose-Capillary 234 (H) 65 - 99 mg/dL  CBC WITH DIFFERENTIAL     Status: Abnormal   Collection Time: 09/23/16  5:52 AM  Result Value Ref Range   WBC 5.6 4.0 - 10.5 K/uL   RBC 3.77 (L) 3.87 - 5.11 MIL/uL   Hemoglobin 10.9 (L) 12.0 - 15.0 g/dL   HCT 33.6 (L) 36.0 - 46.0 %   MCV 89.1 78.0 - 100.0 fL   MCH 28.9 26.0 - 34.0 pg   MCHC 32.4 30.0 - 36.0 g/dL   RDW 14.0 11.5 - 15.5 %   Platelets 218 150 - 400 K/uL   Neutrophils Relative % 54 %   Neutro Abs 3.0 1.7 - 7.7 K/uL   Lymphocytes Relative 32 %   Lymphs Abs 1.8 0.7 - 4.0 K/uL   Monocytes Relative 10 %   Monocytes Absolute 0.6 0.1 - 1.0 K/uL   Eosinophils Relative 3 %   Eosinophils Absolute 0.2 0.0 - 0.7 K/uL   Basophils Relative 1 %   Basophils Absolute 0.0 0.0 - 0.1 K/uL  Comprehensive metabolic panel     Status: Abnormal   Collection Time: 09/23/16  5:52 AM  Result Value Ref Range   Sodium 141 135 - 145 mmol/L   Potassium 3.9 3.5 - 5.1 mmol/L   Chloride 110 101 - 111 mmol/L   CO2 21 (L) 22 - 32 mmol/L   Glucose, Bld 186 (H) 65 - 99 mg/dL   BUN 28 (H) 6 - 20 mg/dL   Creatinine, Ser 2.07 (H) 0.44 - 1.00 mg/dL   Calcium 10.0 8.9 - 10.3 mg/dL   Total Protein 6.9 6.5 - 8.1 g/dL   Albumin 3.2 (L) 3.5 - 5.0 g/dL   AST 17 15 - 41 U/L   ALT 14 14 - 54 U/L   Alkaline Phosphatase 57 38 - 126 U/L   Total Bilirubin 0.6 0.3 - 1.2 mg/dL   GFR calc non Af Amer 21 (L) >60 mL/min   GFR calc Af Amer 24 (L) >60 mL/min    Comment: (NOTE) The eGFR has been calculated using the CKD EPI  equation. This calculation has not been validated in all clinical situations. eGFR's persistently <60 mL/min signify possible Chronic Kidney Disease.    Anion gap 10 5 - 15  Glucose, capillary     Status: Abnormal   Collection Time: 09/23/16  6:43 AM  Result Value Ref Range   Glucose-Capillary 190 (H) 65 - 99 mg/dL     HEENT: normal Cardio: RRR and no murmur Resp: CTA B/L and unlabored GI: BS positive and NT, ND Extremity:  No Edema Skin:   Intact Neuro: Alert/Oriented, Normal Sensory, Abnormal Motor 3- Left delt bi tri grip, HF, KE ADF and Abnormal FMC Ataxic/ dec FMC Musc/Skel:  Other no pain with UE or LE ROM Gen NAD   Assessment/Plan: 1. 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 Physiatrist is providing close team supervision and  24 hour management of active medical problems listed below. Physiatrist and rehab team continue to assess barriers to discharge/monitor patient progress toward functional and medical goals. FIM:                   Function - Comprehension Comprehension: Auditory Comprehension assist level: Understands complex 90% of the time/cues 10% of the time  Function - Expression Expression: Verbal Expression assist level: Expresses basic needs/ideas: With no assist  Function - Social Interaction Social Interaction assist level: Interacts appropriately 75 - 89% of the time - Needs redirection for appropriate language or to initiate interaction.  Function - Problem Solving Problem solving assist level: Solves basic 75 - 89% of the time/requires cueing 10 - 24% of the time  Function - Memory Memory assist level: Recognizes or recalls 75 - 89% of the time/requires cueing 10 - 24% of the time Patient normally able to recall (first 3 days only): Current season, That he or she is in a hospital  Medical Problem List and Plan: 1. Left  hemiparesissecondary to right PLICinfarct Initiate PT, OT today 2. DVT Prophylaxis/Anticoagulation: Subcutaneous heparin. Monitor platelet counts and any signs of bleeding 3. Pain Management: Tylenol as needed 4. Mood: Provide emotional support 5. Neuropsych: This patient iscapable of making decisions on herown behalf. 6. Skin/Wound Care: Routine skin checks 7. Fluids/Electrolytes/Nutrition: Routine I&O with follow-up chemistries 8.Hypertension. Norvasc 10 mg daily, Cozaar 50 mg twice a day, Lopressor 50 mg daily, Imdur 60 mg daily. Monitor with increased mobility, may be permissive with Sys BP up to 180 for next wk Vitals:   09/22/16 1825 09/23/16 0525  BP: (!) 178/67 (!) 163/88  Pulse: 73 92  Resp: 16 18  Temp: 97.7 F (36.5 C) 99 F (37.2 C)  SpO2: 100% 100%   9.Diabetes mellitus with peripheral neuropathy. Hemoglobin A1c 7.7. SSI. Patient on Glucotrol 5 mg daily prior to admission. Resume 2.56m qam today CBG (last 3)   Recent Labs  09/22/16 1624 09/22/16 2044 09/23/16 0643  GLUCAP 158* 234* 190*    10.CAD with CABG. Continue Plavix. No chest pain or shortness of breath 11.CKD stage III. Creatinine 2.04 on admission. Follow-up chemistries 12.Hyperlipidemia. Lipitor  LOS (Days) 1 A FACE TO FACE EVALUATION WAS PERFORMED  Iviona Hole E 09/23/2016, 7:57 AM

## 2016-09-23 NOTE — Progress Notes (Signed)
Kirsteins, [name], MD Physician Signed Physical Medicine and Rehabilitation  Consult Note Date of Service: 09/21/2016 11:21 AM  Related encounter: ED to Hosp-Admission (Discharged) from 09/20/2016 in [facility] Collapse All   [] Hide copied text [] Hover for attribution information      Physical Medicine and Rehabilitation Consult Reason for Consult: Left side weakness Referring Physician: [facility]   HPI: [name] is a 83 y.o. right handed female with history of CAD/CABG maintained on aspirin., diabetes mellitus, hypertension, CKD stage III. Per chart review patient lives with daughter. Independent prior to admission. She does not drive. Daughter works during the day. Presented 09/20/2016 with left-sided weakness. Cranial CT scan negative. Patient did not receive TPA. MRI of the brain showed 11 mm acute early subacute infarction involving right posterior limb of internal capsule and lateral thalamus. MRA with no large vessel occlusion or aneurysm. Echocardiogram with ejection fraction of 16% grade 1 diastolic dysfunction. Carotid Doppler is pending. Currently maintained on Plavix for CVA prophylaxis. Subcutaneous heparin for DVT prophylaxis. Physical and occupational therapy evaluation pending. M.D. has requested physical medicine rehabilitation consult  Patient states that she has no numbness on the left side, no swallowing dysfunction, has not had a bowel movement since admission, but has used the toilet for bladder needs. Got up with physical therapy two-person assist, but according the patient, they were not hanging onto her gait belt.   Review of Systems  Constitutional: Negative for chills and fever.  HENT: Negative for hearing loss.   Eyes: Negative for blurred vision and double vision.  Respiratory: Negative for cough and shortness of breath.        Occasional shortness of breath with exertion  Cardiovascular: Positive for  leg swelling. Negative for chest pain and palpitations.  Gastrointestinal: Positive for constipation. Negative for nausea and vomiting.  Genitourinary: Negative for dysuria, hematuria and urgency.  Musculoskeletal: Positive for joint pain and myalgias.  Skin: Negative for rash.  Neurological: Positive for sensory change and weakness. Negative for seizures.  All other systems reviewed and are negative.      Past Medical History:  Diagnosis Date  . Acute renal injury ([place]) 07/2016  . Anginal pain ([place]) 2004; 2011  . CAD (coronary artery disease)    status post stenting of the RCA status post CABG. (left internal mammary artery to  LAD, saphenous vein graft to second diagonal, saphenous vein  graft to obtuse marginal 1, saphenous vein graft to posterior  descending).  . Cardiomyopathy     Mildly reduced EF (45%).   . Chronic kidney disease (CKD), stage III (moderate)   . History of blood transfusion 1960s   "related to anemia"  . HTN (hypertension)   . Myocardial infarction ([place]) 2011  . Pleural effusion 2011   S/P "bypass"  . Shortness of breath   . Stroke ([place]) 09/19/2016   "left sided weakness" (09/20/2016)  . Type II diabetes mellitus ([place])         Past Surgical History:  Procedure Laterality Date  . CATARACT EXTRACTION W/ INTRAOCULAR LENS  IMPLANT, BILATERAL Bilateral   . CORONARY ANGIOPLASTY WITH STENT PLACEMENT  2004   "LAD & one of the circumflex"  . CORONARY ARTERY BYPASS GRAFT  2011   CABG X4   History reviewed. No pertinent family history. Social History:  reports that she has never smoked. She has never used smokeless tobacco. She reports that she does not drink alcohol or use  drugs. Allergies:      Allergies  Allergen Reactions  . Lisinopril Swelling         Medications Prior to Admission  Medication Sig Dispense Refill  . amLODipine (NORVASC) 10 MG tablet Take 1 tablet (10 mg total) by mouth daily. 90 tablet 3  . aspirin 81 MG tablet  Take 81 mg by mouth daily.     . cholecalciferol (VITAMIN D) 400 units TABS tablet Take 400 Units by mouth.    Marland Kitchen glipiZIDE (GLUCOTROL XL) 5 MG 24 hr tablet Take 5 mg by mouth daily.    . isosorbide mononitrate (IMDUR) 60 MG 24 hr tablet TAKE ONE TABLET BY MOUTH DAILY. 30 tablet 6  . losartan (COZAAR) 50 MG tablet Take 1 tablet (50 mg total) by mouth 2 (two) times daily. 60 tablet 9  . metoprolol (LOPRESSOR) 50 MG tablet TAKE ONE TABLET BY MOUTH TWICE DAILY (Patient taking differently: TAKE ONE TABLET BY MOUTH DAILY) 180 tablet 2    Home: Home Living Family/patient expects to be discharged to:: Private residence Living Arrangements: Children Available Help at Discharge: Available PRN/intermittently Type of Home: House Home Access: Stairs to enter Technical brewer of Steps: 2 Entrance Stairs-Rails: None Home Layout: Multi-level Alternate Level Stairs-Number of Steps: 5 landing 5 Alternate Level Stairs-Rails: Right, Left, Can reach both Bathroom Shower/Tub: Tub/shower unit, Multimedia programmer: Standard Bathroom Accessibility: Yes Home Equipment: Environmental consultant - 2 wheels, Shower seat - built in, [employer] - tub/shower  Lives With: Daughter  Functional History: Prior Function Level of Independence: Independent Comments: Pt reports that she performed her ADLs and simple cooking tasks at home PTA. Pt states that she has a lady who comes in to clean and her daughter assists with IADLs as needed. Functional Status:  Mobility:  ADL: ADL Overall ADL's : Needs assistance/impaired  Cognition: Cognition Overall Cognitive Status: Within Functional Limits for tasks assessed Arousal/Alertness: Awake/alert Orientation Level: Oriented X4 Attention: Focused, Sustained Focused Attention: Appears intact Sustained Attention: Appears intact Memory: Appears intact Awareness: Appears intact Problem Solving: Appears intact Executive Function: Self Monitoring,  Organizing Organizing: Appears intact Self Monitoring: Appears intact Safety/Judgment: Appears intact Cognition Overall Cognitive Status: Within Functional Limits for tasks assessed  Blood pressure (!) 161/61, pulse 61, temperature 98.6 F (37 C), temperature source Oral, resp. rate 17, height 5\' 3"  (1.6 m), weight 76.2 kg (168 lb), SpO2 96 %. Physical Exam  Vitals reviewed. Constitutional: She is oriented to person, place, and time. She appears well-developed.  HENT:  Head: Normocephalic.  Eyes: EOM are normal.  Neck: Normal range of motion. Neck supple. No thyromegaly present.  Cardiovascular: Normal rate, regular rhythm and normal heart sounds.   Respiratory: Effort normal and breath sounds normal. No respiratory distress.  GI: Soft. Bowel sounds are normal. She exhibits no distension.  Neurological: She is alert and oriented to person, place, and time.  Follows commands. Fair awareness of deficits.  Skin: Skin is warm and dry.       Assessment/Plan: Diagnosis: Right PLIC infarct causing left hemiparesis. 1. Does the need for close, 24 hr/day medical supervision in concert with the patient's rehab needs make it unreasonable for this patient to be served in a less intensive setting? Yes 2. Co-Morbidities requiring supervision/potential complications: History of coronary artery disease, CABG, diabetes, hypertension, CKD 3 3. Due to bladder management, bowel management, safety, skin/wound care, disease management, medication administration, pain management and patient education, does the patient require 24 hr/day rehab nursing? Yes 4. Does the patient  require coordinated care of a physician, rehab nurse, PT (1-2 hrs/day, 5 days/week) and OT (1-2 hrs/day, 5 days/week) to address physical and functional deficits in the context of the above medical diagnosis(es)? Yes Addressing deficits in the following areas: balance, endurance, locomotion, strength, transferring, bowel/bladder  control, bathing, dressing, feeding, grooming, toileting and psychosocial support 5. Can the patient actively participate in an intensive therapy program of at least 3 hrs of therapy per day at least 5 days per week? No 6. The potential for patient to make measurable gains while on inpatient rehab is excellent 7. Anticipated functional outcomes upon discharge from inpatient rehab are modified independent and supervision  with PT, modified independent and supervision with OT, n/a with SLP. 8. Estimated rehab length of stay to reach the above functional goals is: 7-10d 9. Anticipated D/C setting: Home 10. Anticipated post D/C treatments: [facility] therapy 11. Overall Rehab/Functional Prognosis: excellent  RECOMMENDATIONS: This patient's condition is appropriate for continued rehabilitative care in the following setting: CIR Patient has agreed to participate in recommended program. Yes Note that insurance prior authorization may be required for reimbursement for recommended care.  CommentCathlyn Parsons., PA-C 09/21/2016    Revision History                        Routing History

## 2016-09-23 NOTE — Care Management Note (Signed)
Inpatient Rehabilitation Center Individual Statement of Services  Patient Name:  [name]  Date:  09/23/2016  Welcome to the [facility].  Our goal is to provide you with an individualized program based on your diagnosis and situation, designed to meet your specific needs.  With this comprehensive rehabilitation program, you will be expected to participate in at least 3 hours of rehabilitation therapies Monday-Friday, with modified therapy programming on the weekends.  Your rehabilitation program will include the following services:  Physical Therapy (PT), Occupational Therapy (OT), Speech Therapy (ST), 24 hour per day rehabilitation nursing, Case Management (Social Worker), Rehabilitation Medicine, Nutrition Services and Pharmacy Services  Weekly team conferences will be held on Wednesday to discuss your progress.  Your Social Worker will talk with you frequently to get your input and to update you on team discussions.  Team conferences with you and your family in attendance may also be held.  Expected length of stay: 10-14 days Overall anticipated outcome: mod/i-supervision set up with bathing and dressing  Depending on your progress and recovery, your program may change. Your Social Worker will coordinate services and will keep you informed of any changes. Your Social Worker's name and contact numbers are listed  below.  The following services may also be recommended but are not provided by the [facility]:   [place] will be made to provide these services after discharge if needed.  Arrangements include referral to agencies that provide these services.  Your insurance has been verified to be:  [employer]-Medicare Your primary doctor is:  Deland Pretty  Pertinent information will be shared with your doctor and your insurance company.  Social Worker:  Ovidio Kin, [place] or [phone]  Information discussed with and copy given to patient by: [name], 09/23/2016, 1:06 PM

## 2016-09-23 NOTE — Progress Notes (Signed)
Physical Therapy Assessment and Plan  Patient Details  Name: [name] MRN: [id number] Date of Birth: 04/23/1927  PT Diagnosis: Abnormal posture, Abnormality of gait, Difficulty walking, Hemiplegia non-dominant and Muscle weakness Rehab Potential: Good ELOS: 10-14 days   Today's Date: 09/23/2016 PT Individual Time: 1430-1530 PT Individual Time Calculation (min): 60 min    Problem List:  Patient Active Problem List   Diagnosis Date Noted  . Small vessel disease 09/22/2016  . Hemiparesis affecting left side as late effect of stroke ([place]) 09/22/2016  . Gait disturbance, post-stroke 09/22/2016  . Stroke (cerebrum) ([place]) 09/20/2016  . Diabetes mellitus with complication ([place])   . Ischemic stroke ([place])   . Status post intraocular lens implant 06/23/2011  . Glaucoma suspect 12/30/2010  . Macular hole 12/30/2010  . Nonproliferative diabetic retinopathy ([place]) 12/30/2010  . Syncope 06/17/2010  . CARDIOMYOPATHY 04/15/2010  . PLEURAL EFFUSION 04/14/2010  . SHORTNESS OF BREATH 04/14/2010  . CORONARY ATHEROSCLEROSIS NATIVE CORONARY ARTERY 01/19/2009  . Hyperlipidemia 01/06/2009  . Diabetes mellitus ([place]) 01/05/2009  . Essential hypertension 01/05/2009  . Coronary atherosclerosis 01/05/2009    Past Medical History:  Past Medical History:  Diagnosis Date  . Acute renal injury ([place]) 07/2016  . Anginal pain ([place]) 2004; 2011  . CAD (coronary artery disease)    status post stenting of the RCA status post CABG. (left internal mammary artery to  LAD, saphenous vein graft to second diagonal, saphenous vein  graft to obtuse marginal 1, saphenous vein graft to posterior  descending).  . Cardiomyopathy     Mildly reduced EF (45%).   . Chronic kidney disease (CKD), stage III (moderate)   . History of blood transfusion 1960s   "related to anemia"  . HTN (hypertension)   . Myocardial infarction ([place]) 2011  . Pleural effusion 2011   S/P "bypass"  . Shortness of breath   . Stroke ([place])  09/19/2016   "left sided weakness" (09/20/2016)  . Type II diabetes mellitus ([place])    Past Surgical History:  Past Surgical History:  Procedure Laterality Date  . CATARACT EXTRACTION W/ INTRAOCULAR LENS  IMPLANT, BILATERAL Bilateral   . CORONARY ANGIOPLASTY WITH STENT PLACEMENT  2004   "LAD & one of the circumflex"  . CORONARY ARTERY BYPASS GRAFT  2011   CABG X4    Assessment & Plan Clinical Impression: Patient is a 83 y.o.right handed femalewith history of CAD/CABGmaintained on aspirin., diabetes mellitus, hypertension, CKD stage III.Per chart review patient lives with daughter. Independent prior to admission. She does not drive. Daughter works during the day.Presented 09/20/2016 with left-sided weakness. Cranial CT scan negative. Patient did not receive TPA. MRI of the brain showed 11 mm acute early subacute infarction involving right posterior limb of internal capsule and lateral thalamus. MRA with no large vessel occlusion or aneurysm. Echocardiogram with ejection fraction of 61% grade 1 diastolic dysfunction. Currently maintained on Plavix for CVA prophylaxis. Subcutaneous heparin for DVT prophylaxis. Patient transferred to CIR on 09/22/2016 .   Patient currently requires mod with mobility secondary to muscle weakness, decreased cardiorespiratoy endurance, impaired timing and sequencing, unbalanced muscle activation, decreased coordination and decreased motor planning and decreased sitting balance, decreased standing balance, hemiplegia and decreased balance strategies.  Prior to hospitalization, patient was independent  with mobility and lived with Daughter in a House home.  Home access is 2Stairs to enter.  Patient will benefit from skilled PT intervention to maximize safe functional mobility, minimize fall risk and decrease caregiver burden for planned discharge home with  24 hour supervision.  Anticipate patient will benefit from follow up [place] at discharge.  PT - End of  Session Activity Tolerance: Tolerates 30+ min activity with multiple rests Endurance Deficit: Yes Endurance Deficit Description: decreased PT Assessment Rehab Potential (ACUTE/IP ONLY): Good PT Barriers to Discharge: [place] home environment;Decreased caregiver support;Home environment access/layout;Lack of/limited family support PT Barriers to Discharge Comments: bedroom on 2nd floor, pt's family may be able to arrange bed on 1st floor PT Patient demonstrates impairments in the following area(s): Balance;Endurance;Motor;Safety PT Transfers Functional Problem(s): Bed Mobility;Bed to Chair;Car;Furniture;Floor PT Locomotion Functional Problem(s): Stairs;Wheelchair Mobility;Ambulation PT Plan PT Intensity: Minimum of 1-2 x/day ,45 to 90 minutes PT Frequency: 5 out of 7 days PT Duration Estimated Length of Stay: 10-14 days PT Treatment/Interventions: Ambulation/gait training;Balance/vestibular training;Cognitive remediation/compensation;Community reintegration;Discharge planning;Disease management/prevention;DME/adaptive equipment instruction;Functional electrical stimulation;Functional mobility training;Neuromuscular re-education;Pain management;Patient/family education;Psychosocial support;Skin care/wound management;Splinting/orthotics;Stair training;Therapeutic Activities;Therapeutic Exercise;UE/LE Strength taining/ROM;UE/LE Coordination activities;Visual/perceptual remediation/compensation;Wheelchair propulsion/positioning PT Transfers Anticipated Outcome(s): Mod I PT Locomotion Anticipated Outcome(s): Supervision PT Recommendation Follow Up Recommendations: Home health PT Patient destination: Home Equipment Recommended: To be determined  Skilled Therapeutic Intervention  Pt in w/c upon arrival and agreeable to therapy, no c/o pain. Performed functional mobility tasks as detailed below and instructed pt on use of RW, 25' x2 w/ Mod A and verbal/tactile cues for gait pattern. She requires  encouragement to use LLE as she believes "it can't move". Instructed pt and practiced transfers to/from bedside commode and to/from w/c as she had increased anxiety about non-therapy providers assisting her to commode or w/c. Educated pt on training all providers receive on this unit and she consistently performed safe transfers w/ no more than Min A via stand pivot. Pt verbalized understanding and in agreement to have confidence in her own abilities during transfer. Ended session in w/c, call bell within reach and all needs met.   PT instructed patient in PT Evaluation and initiated treatment intervention; see below for results. PT educated patient in [place], rehab potential, rehab goals, and discharge recommendations.   PT Evaluation Precautions/Restrictions Precautions Precautions: Fall Precaution Comments: L hemi Restrictions Weight Bearing Restrictions: No General   Vital SignsTherapy Vitals Temp: 97.7 F (36.5 C) Temp Source: Oral Pulse Rate: 75 Resp: 18 BP: (!) 169/91 Patient Position (if appropriate): Sitting Oxygen Therapy SpO2: 100 % O2 Device: Not Delivered Pain Pain Assessment Pain Assessment: No/denies pain Home Living/Prior Functioning Home Living Available Help at Discharge: Available PRN/intermittently Type of Home: House Home Access: Stairs to enter Technical brewer of Steps: 2 Entrance Stairs-Rails: None (Pt states son may be able to build a rail if need be) Home Layout: Multi-level Alternate Level Stairs-Number of Steps: 5 landing 5 to get to 2nd floor, bedroom on 2nd floor but pt's family may be able to arrange a bed on 1st floor  Alternate Level Stairs-Rails: Right;Left;Can reach both Bathroom Shower/Tub: Tub/shower unit;Walk-in shower Bathroom Toilet: Standard Bathroom Accessibility: Yes  Lives With: Daughter Prior Function Level of Independence: Independent with basic ADLs;Independent with homemaking with ambulation;Independent with gait  Able to  Take Stairs?: Yes Driving: Yes Vocation: Retired Art gallery manager: Within Advertising copywriter Praxis Praxis: Intact  Cognition Overall Cognitive Status: No family/caregiver present to determine baseline cognitive functioning Arousal/Alertness: Awake/alert Orientation Level: Oriented X4 Attention: Focused;Sustained Focused Attention: Appears intact Sustained Attention: Appears intact Memory: Appears intact Awareness: Appears intact Problem Solving: Appears intact Safety/Judgment: Appears intact Sensation Sensation Light Touch: Appears Intact Proprioception: Appears Intact Coordination Gross Motor Movements are Fluid and Coordinated: Yes Fine Motor Movements  are Fluid and Coordinated: No Finger Nose Finger Test: Overshoots Motor  Motor Motor: Hemiplegia Motor - Skilled Clinical Observations: L hemiplegia, generalized weakness throughout  Mobility Bed Mobility Bed Mobility: Rolling Right;Rolling Left;Sit to Supine;Supine to Sit Rolling Right: 4: Min guard Rolling Left: 4: Min guard Supine to Sit: 3: Mod assist Supine to Sit Details: Verbal cues for technique;Verbal cues for sequencing;Manual facilitation for placement;Manual facilitation for weight shifting Sit to Supine: 3: Mod assist Sit to Supine - Details: Manual facilitation for weight shifting;Manual facilitation for placement;Verbal cues for sequencing;Verbal cues for technique Transfers Transfers: Yes Sit to Stand: 4: Min assist Sit to Stand Details: Verbal cues for technique;Verbal cues for sequencing Stand to Sit: 4: Min assist Stand to Sit Details (indicate cue type and reason): Verbal cues for technique;Verbal cues for sequencing Stand Pivot Transfers: 4: Min assist Stand Pivot Transfer Details: Manual facilitation for weight shifting;Manual facilitation for placement Stand Pivot Transfer Details (indicate cue type and reason): Using RW or unilateral HHA Locomotion   Ambulation Ambulation: Yes Ambulation/Gait Assistance: 3: Mod assist Ambulation Distance (Feet): 25 Feet Assistive device: Rolling walker Ambulation/Gait Assistance Details: Verbal cues for gait pattern;Verbal cues for technique;Verbal cues for sequencing Gait Gait: Yes Gait Pattern: Impaired Gait Pattern: Decreased step length - left;Decreased stance time - left;Left circumduction;Left hip hike;Narrow base of support;Trunk flexed;Poor foot clearance - left Gait velocity: decreased Stairs / Additional Locomotion Stairs: No (Unable to assess secondary to safety, will continue to assess) Wheelchair Mobility Wheelchair Mobility: Yes Wheelchair Assistance: 2: Max Technical sales engineer Details: Tactile cues for initiation;Tactile cues for sequencing;Tactile cues for placement;Verbal cues for precautions/safety;Verbal cues for safe use of DME/AE;Verbal cues for technique;Manual facilitation for weight bearing;Manual facilitation for placement Wheelchair Propulsion: Right upper extremity;Right lower extremity Wheelchair Parts Management: Needs assistance Distance: 41'  Trunk/Postural Assessment  Cervical Assessment Cervical Assessment: Within Functional Limits Thoracic Assessment Thoracic Assessment: Within Functional Limits Lumbar Assessment Lumbar Assessment: Within Functional Limits Postural Control Postural Control: Within Functional Limits  Balance Balance Balance Assessed: Yes Static Sitting Balance Static Sitting - Balance Support: Feet supported;No upper extremity supported Static Sitting - Level of Assistance: 5: Stand by assistance Dynamic Sitting Balance Dynamic Sitting - Balance Support: Feet supported;During functional activity;Right upper extremity supported Dynamic Sitting - Level of Assistance: 5: Stand by assistance Static Standing Balance Static Standing - Balance Support: Bilateral upper extremity supported Static Standing - Level of Assistance: 4: Min  assist Dynamic Standing Balance Dynamic Standing - Balance Support: Bilateral upper extremity supported Dynamic Standing - Level of Assistance: 3: Mod assist Extremity Assessment  RUE Assessment RUE Assessment: Not tested (defer to OT) LUE Assessment LUE Assessment: Not tested (defer to OT) RLE Assessment RLE Assessment: Exceptions to [facility] (3+ to 4/5 globally) LLE Assessment LLE Assessment: Exceptions to [facility] (2/5 globally when formally tested, >3-/5 when observed during functional tasks)   See Function Navigator for Current Functional Status.   Refer to Care Plan for Long Term Goals  Recommendations for other services: None   Discharge Criteria: Patient will be discharged from PT if patient refuses treatment 3 consecutive times without medical reason, if treatment goals not met, if there is a change in medical status, if patient makes no progress towards goals or if patient is discharged from hospital.  The above assessment, treatment plan, treatment alternatives and goals were discussed and mutually agreed upon: by patient   K Arnette 09/23/2016, 5:12 PM

## 2016-09-23 NOTE — IPOC Note (Signed)
Overall Plan of Care [facility]) Patient Details Name: [name] MRN: [id number] DOB: 30-Oct-1927  Admitting Diagnosis: R CVA  Hospital Problems: Principal Problem:   Hemiparesis affecting left side as late effect of stroke ([place]) Active Problems:   Essential hypertension   Diabetes mellitus with complication ([place])   Small vessel disease   Gait disturbance, post-stroke     Functional Problem List: Nursing Bladder, Bowel, Edema, Endurance, Medication Management, Motor, Nutrition, Pain, Perception, Safety, Sensory, Skin Integrity, Behavior  PT Balance, Endurance, Motor, Safety  OT Balance, Safety, Cognition, Endurance, Motor  SLP    TR         Basic ADL's: OT Eating, Grooming, Bathing, Dressing, Toileting     Advanced  ADL's: OT Simple Meal Preparation     Transfers: PT Bed Mobility, Bed to Chair, Car, [employer], Futures trader, Tub/Shower     Locomotion: PT Stairs, Emergency planning/management officer, Ambulation     Additional Impairments: OT Fuctional Use of Upper Extremity  SLP        TR      Anticipated Outcomes Item Anticipated Outcome  Self Feeding IND  Swallowing      Basic self-care  mod I- Supervision  Toileting  mod I- Supervision   Bathroom Transfers mod I- Supervion  Bowel/Bladder  modI  Transfers  Mod I  Locomotion  Supervision  Communication     Cognition     Pain  modI  Safety/Judgment  modI   Therapy Plan: PT Intensity: Minimum of 1-2 x/day ,45 to 90 minutes PT Frequency: 5 out of 7 days PT Duration Estimated Length of Stay: 10-14 days OT Intensity: Minimum of 1-2 x/day, 45 to 90 minutes OT Frequency: 5 out of 7 days OT Duration/Estimated Length of Stay: 10-14 days       Team Interventions: Nursing Interventions Patient/Family Education, Bladder Management, Bowel Management, Medication Management, Disease Management/Prevention, Skin Care/Wound Management, Cognitive Remediation/Compensation, Discharge Planning, Psychosocial Support  PT  interventions Ambulation/gait training, Balance/vestibular training, Cognitive remediation/compensation, Community reintegration, Discharge planning, Disease management/prevention, DME/adaptive equipment instruction, Functional electrical stimulation, Functional mobility training, Neuromuscular re-education, Pain management, Patient/family education, Psychosocial support, Skin care/wound management, Splinting/orthotics, Stair training, Therapeutic Activities, Therapeutic Exercise, UE/LE Strength taining/ROM, UE/LE Coordination activities, Visual/perceptual remediation/compensation, Wheelchair propulsion/positioning  OT Interventions Neuromuscular re-education, Patient/family education, Self Care/advanced ADL retraining, Therapeutic Exercise, UE/LE Coordination activities, Training and development officer, Discharge planning, DME/adaptive equipment instruction, Functional mobility training, Therapeutic Activities, UE/LE Strength taining/ROM  SLP Interventions    TR Interventions    SW/CM Interventions Discharge Planning, Psychosocial Support, Patient/Family Education   Barriers to Discharge MD  Medical stability  Nursing Incontinence    PT Inaccessible home environment, Decreased caregiver support, Home environment access/layout, Lack of/limited family support bedroom on 2nd floor, pt's family may be able to arrange bed on 1st floor  OT Decreased caregiver support, Home environment access/layout, Lack of/limited family support Pt lives at home with her daughter in a two story home. Her bedroom is located on the second story and her walk in shower is located upstairs. In the past, her family has set up a room for her downstairs from previous case. Pt noted that her daughter is available before and after working hours to provide assistance. Goal for pt to return home at mod I-S level.   SLP      SW       Team Discharge Planning: Destination: PT-Home ,OT- Home , SLP-  Projected Follow-up: PT-Home health  PT, OT-  Home health OT, SLP-  Projected Equipment Needs:  PT-To be determined, OT- To be determined, SLP-  Equipment Details: PT- , OT-pt has a walker at home, built in shower seat in shower and long handled sponge in bath  Patient/family involved in discharge planning: PT- Patient,  OT-Patient, SLP-   MD ELOS: 10-14 days Medical Rehab Prognosis:  Excellent Assessment: The patient has been admitted for CIR therapies with the diagnosis of right CVA. The team will be addressing functional mobility, strength, stamina, balance, safety, adaptive techniques and equipment, self-care, bowel and bladder mgt, patient and caregiver education, NMR, community reentry, ego support . Goals have been set at mod I for basic mobility and transfers and mod I to supervision with ADL's and self-care.Meredith Staggers, MD, FAAPMR      See Team Conference Notes for weekly updates to the plan of care

## 2016-09-23 NOTE — Progress Notes (Signed)
Occupational Therapy Session Note  Patient Details  Name: [name] MRN: [id number] Date of Birth: [birth year]  Today's Date: 09/23/2016 OT Individual Time: 1105-1205 OT Individual Time Calculation (min): 60 min    Short Term Goals: Week 1:  OT Short Term Goal 1 (Week 1): Pt will complete toilet transfers with min A  OT Short Term Goal 2 (Week 1): Pt will completed UB dressing while sitting using hemi dressing techniques  OT Short Term Goal 3 (Week 1): Pt will tolerate standing while brushing her teeth  OT Short Term Goal 4 (Week 1): Pt will complete LB dressing with min A  OT Short Term Goal 5 (Week 1): Pt will complete shower transfer with min A     Skilled Therapeutic Interventions/Progress Updates:  Tx focus on Lt NMR, endurance, and pt education during functional tasks.   Pt greeted in w/c, ready for tx. Provided her with multiple HEPs in room and reviewed them together. Pt exhibiting greatest challenges with hand manipulations and intrinsic digit strength. Educated her on using Rt arm for fine motor tasks when she needed more visual feedback regarding technique (i.e pincer grasp or palm>finger translation). Pt applying lotion with cues to use Lt hand for opening cap/squeezing bottle. Educated her on using Lt UE as much as possible to maximize functional gains. Pt very motivated to work on Lt arm and return to [employer]. At end of tx pt was left in w/c. "When you leave, I'm still going to work on this." All needs within reach.    Therapy Documentation Precautions:  Precautions Precautions: Fall Precaution Comments: left sided weakness Restrictions Weight Bearing Restrictions: No General: General Chart Reviewed: Yes Additional Pertinent History:  CAD/CABG maintained on aspirin., diabetes mellitus, hypertension, CKD stage III Response to Previous Treatment: Patient reporting fatigue but able to participate Family/Caregiver Present: No Pain: Pain Assessment Pain Assessment:  No/denies pain ADL: ADL Eating: Set up Where Assessed-Eating: Bed level Grooming: Minimal assistance Where Assessed-Grooming: Sitting at sink Upper Body Bathing: Minimal assistance Where Assessed-Upper Body Bathing: Shower Lower Body Bathing: Maximal assistance Where Assessed-Lower Body Bathing: Shower Upper Body Dressing: Maximal cueing, Minimal assistance Where Assessed-Upper Body Dressing: Sitting at sink Lower Body Dressing: Maximal cueing, Moderate assistance Where Assessed-Lower Body Dressing: Sitting at sink Toileting: Moderate assistance, Moderate cueing Where Assessed-Toileting: Bedside Commode Toilet Transfer: Moderate assistance Toilet Transfer Method: Stand pivot Toilet Transfer Equipment: Bedside commode Tub/Shower Transfer: Unable to assess Tub/Shower Transfer Method: Unable to assess Social research officer, government: Moderate assistance, Moderate cueing Social research officer, government Method: Radiographer, therapeutic: Grab bars, Shower seat with back ADL Comments: Pt demonstrates fear of falling and needs reminders to use L side  Vision Baseline Vision/History: Wears glasses Patient Visual Report: Other (comment) (no glasses in hosp) Vision Assessment?: No apparent visual deficits Perception  Perception: Within Functional Limits Praxis Praxis: Intact     See Function Navigator for Current Functional Status.   Therapy/Group: Individual Therapy  Rae Plotner A Logyn Dedominicis 09/23/2016, 12:39 PM

## 2016-09-24 ENCOUNTER — Inpatient Hospital Stay (HOSPITAL_COMMUNITY): Payer: Medicare Other

## 2016-09-24 ENCOUNTER — Inpatient Hospital Stay (HOSPITAL_COMMUNITY): Payer: Medicare Other | Admitting: Physical Therapy

## 2016-09-24 ENCOUNTER — Inpatient Hospital Stay (HOSPITAL_COMMUNITY): Payer: Medicare Other | Admitting: Occupational Therapy

## 2016-09-24 LAB — GLUCOSE, CAPILLARY
GLUCOSE-CAPILLARY: 156 mg/dL — AB (ref 65–99)
GLUCOSE-CAPILLARY: 168 mg/dL — AB (ref 65–99)
Glucose-Capillary: 288 mg/dL — ABNORMAL HIGH (ref 65–99)
Glucose-Capillary: 148 mg/dL — ABNORMAL HIGH (ref 65–99)

## 2016-09-24 NOTE — Progress Notes (Signed)
Occupational Therapy Session Note  Patient Details  Name: [name] MRN: [id number] Date of Birth: 1927/10/07  Today's Date: 09/24/2016 OT Individual Time: 3748-2707 OT Individual Time Calculation (min): 42 min    Short Term Goals: Week 1:  OT Short Term Goal 1 (Week 1): Pt will complete toilet transfers with min A  OT Short Term Goal 2 (Week 1): Pt will completed UB dressing while sitting using hemi dressing techniques  OT Short Term Goal 3 (Week 1): Pt will tolerate standing while brushing her teeth  OT Short Term Goal 4 (Week 1): Pt will complete LB dressing with min A  OT Short Term Goal 5 (Week 1): Pt will complete shower transfer with min A   Skilled Therapeutic Interventions/Progress Updates:    1;1. Pt with direct handoff in w/c from PT. Focus of session on [facility] of LUE and coordination. At table top pt plays game of "bananagrams" using palm<>finger translocation to manipulate tiles and spell words. Pt requires min cues for word finding and strategizing. Pt deals playing cards with LUE to practice [facility] shifting skills. OT propelled w/c back to room for time management and exited session with pt seated in w/c with call light in reach and all needs met.    Therapy Documentation Precautions:  Precautions Precautions: Fall Precaution Comments: L hemi Restrictions Weight Bearing Restrictions: No  See Function Navigator for Current Functional Status.   Therapy/Group: Individual Therapy  Tonny Branch 09/24/2016, 4:13 PM

## 2016-09-24 NOTE — Progress Notes (Signed)
[name] is a 83 y.o. female 05-18-1927 [id number]  Subjective: No new complaints. No new problems. Slept well. Feeling OK.  Objective: Vital signs in last 24 hours: Temp:  [97.7 F (36.5 C)-98.4 F (36.9 C)] 98.4 F (36.9 C) (08/25 0612) Pulse Rate:  [75-79] 79 (08/25 0612) Resp:  [18] 18 (08/25 0612) BP: (169-174)/(69-91) 174/69 (08/25 0612) SpO2:  [100 %] 100 % (08/25 0612) Weight change:  Last BM Date: 09/23/16  Intake/Output from previous day: No intake/output data recorded. Last cbgs: CBG (last 3)   Recent Labs  09/23/16 2037 09/24/16 0638 09/24/16 1209  GLUCAP 182* 156* 288*     Physical Exam General: No apparent distress   HEENT: not dry Lungs: Normal effort. Lungs clear to auscultation, no crackles or wheezes. Cardiovascular: Regular rate and rhythm, no edema Abdomen: S/NT/ND; BS(+) Musculoskeletal:  unchanged Neurological: No new neurological deficits Wounds: N/A    Skin: clear  Aging changes Mental state: Alert, oriented, cooperative    Lab Results: BMET    Component Value Date/Time   NA 141 09/23/2016 0552   K 3.9 09/23/2016 0552   CL 110 09/23/2016 0552   CO2 21 (L) 09/23/2016 0552   GLUCOSE 186 (H) 09/23/2016 0552   BUN 28 (H) 09/23/2016 0552   CREATININE 2.07 (H) 09/23/2016 0552   CALCIUM 10.0 09/23/2016 0552   GFRNONAA 21 (L) 09/23/2016 0552   GFRAA 24 (L) 09/23/2016 0552   CBC    Component Value Date/Time   WBC 5.6 09/23/2016 0552   RBC 3.77 (L) 09/23/2016 0552   HGB 10.9 (L) 09/23/2016 0552   HCT 33.6 (L) 09/23/2016 0552   PLT 218 09/23/2016 0552   MCV 89.1 09/23/2016 0552   MCH 28.9 09/23/2016 0552   MCHC 32.4 09/23/2016 0552   RDW 14.0 09/23/2016 0552   LYMPHSABS 1.8 09/23/2016 0552   MONOABS 0.6 09/23/2016 0552   EOSABS 0.2 09/23/2016 0552   BASOSABS 0.0 09/23/2016 0552    Studies/Results: No results found.  Medications: I have reviewed the patient's current medications.  Assessment/Plan:  1. L hemiparesis  - CIR 2. R PLIC CVA - Plavix 3. HTN: Cozaar, Lopressor, Imdur 4. DM2 Glucotrol. Insulin SS 5. CAD - Plavix 6. CKD 3 - monitor labs 7. Dyslipidemia - Lipitor     Length of stay, days: 2  Walker Kehr , MD 09/24/2016, 1:03 PM

## 2016-09-24 NOTE — Progress Notes (Signed)
Physical Therapy Session Note  Patient Details  Name: [name] MRN: [id number] Date of Birth: [birth year]  Today's Date: 09/24/2016 PT Individual Time: 0800-0856 AND 2751-7001 PT Individual Time Calculation (min): 56 min AND 33 min  Short Term Goals: Week 1:  PT Short Term Goal 1 (Week 1): Pt will transfer bed<>chair w/ supervision PT Short Term Goal 2 (Week 1): Pt will ambulate 73' w/ Min A using LRAD PT Short Term Goal 3 (Week 1): Pt will maintain dynamic standing balance w/ Min A  PT Short Term Goal 4 (Week 1): Pt will self-propel w/c w/ Min A 50' PT Short Term Goal 5 (Week 1): Pt will perform all bed mobility w/ supervision  Skilled Therapeutic Interventions/Progress Updates:   Session 1:  Pt supine in bed and agreeable to therapy, no c/o pain. Worked on tolerance to OOB activity, functional mobility, and standing NMR this session. Pt transferred to EOB w/ supervision and assisted in self-care at EOB for 5 min while pt maintained static sitting balance w/ supervision. Pt ambulated 25' x3 w/ RW seated and Min guard. Seated rest breaks in between bouts. Pt negotiated 4 steps w/ Mod A for balance verbal cues for technique. NMR in stance w/ emphasis on L single leg stance balance - R forward stepping w/ bilateral UE support on walker. Ended session in w/c, call bell within reach and all needs met.   Session 2:  Pt in w/c and agreeable to therapy, no c/o pain. Worked on endurance, LE strength, and reciprocal movement pattern this session. NuStep @ L2 for 20 min using bilateral UE/LEs. Mild increase in work of breathing, no rest breaks required. Ended session in w/c and in care of OT.   Therapy Documentation Precautions:  Precautions Precautions: Fall Precaution Comments: L hemi Restrictions Weight Bearing Restrictions: No Vital Signs: Therapy Vitals Temp: 98.1 F (36.7 C) Temp Source: Oral Pulse Rate: 66 Resp: 18 BP: (!) 132/57 Patient Position (if appropriate):  Sitting Oxygen Therapy SpO2: 100 % O2 Device: Not Delivered  See Function Navigator for Current Functional Status.   Therapy/Group: Individual Therapy  [name] 09/24/2016, 3:19 PM

## 2016-09-24 NOTE — Progress Notes (Signed)
Occupational Therapy Session Note  Patient Details  Name: [name] MRN: [id number] Date of Birth: [birth year]  Today's Date: 09/24/2016 OT Individual Time: 4098-1191 OT Individual Time Calculation (min): 60 min    Short Term Goals: Week 1:  OT Short Term Goal 1 (Week 1): Pt will complete toilet transfers with min A  OT Short Term Goal 2 (Week 1): Pt will completed UB dressing while sitting using hemi dressing techniques  OT Short Term Goal 3 (Week 1): Pt will tolerate standing while brushing her teeth  OT Short Term Goal 4 (Week 1): Pt will complete LB dressing with min A  OT Short Term Goal 5 (Week 1): Pt will complete shower transfer with min A   Skilled Therapeutic Interventions/Progress Updates:    Tx focus on Lt NMR and balance during functional tasks.   Pt greeted in w/c. Already bathed and dressed. Ready to go.  She was escorted to dayroom. Engaged in L UE [place] activities in sitting/standing as tolerated (Min A sit<stand). Pt exhibited stooped posture when standing (even with max cues to acknowledge). Therefore majority of session completed while seated. Used simulated grocery task to work on [facility] with use of paper money/coins. Had pt "buy" specific items after reading price on label. Worked on in Ecologist while Environmental consultant bills in [place] hand, and also for retrieving proper sum of coins. NMR of pincer and 3 jaw chuck grasping patterns for retrieving coins off of table instead of sliding them off. Palm<finger translations depositing these coins back inside of bin. Pt with increased challenge during coin stacking (pennies in particular). "I'm not leaving until I stack 5." With persistence and repetition, pt was able to accomplish this 3 times! She was then escorted back to room and set up for lunch. She was left with all needs within reach, actively integrating L UE during self feeding, as we discussed yesterday.    Therapy Documentation Precautions:   Precautions Precautions: Fall Precaution Comments: L hemi Restrictions Weight Bearing Restrictions: No Pain: Pain Assessment Pain Assessment: No/denies pain ADL: ADL Eating: Set up Where Assessed-Eating: Bed level Grooming: Minimal assistance Where Assessed-Grooming: Sitting at sink Upper Body Bathing: Minimal assistance Where Assessed-Upper Body Bathing: Shower Lower Body Bathing: Maximal assistance Where Assessed-Lower Body Bathing: Shower Upper Body Dressing: Maximal cueing, Minimal assistance Where Assessed-Upper Body Dressing: Sitting at sink Lower Body Dressing: Maximal cueing, Moderate assistance Where Assessed-Lower Body Dressing: Sitting at sink Toileting: Moderate assistance, Moderate cueing Where Assessed-Toileting: Bedside Commode Toilet Transfer: Moderate assistance Toilet Transfer Method: Stand pivot Toilet Transfer Equipment: Bedside commode Tub/Shower Transfer: Unable to assess Tub/Shower Transfer Method: Unable to assess Social research officer, government: Moderate assistance, Moderate cueing Social research officer, government Method: Radiographer, therapeutic: Grab bars, Shower seat with back ADL Comments: Pt demonstrates fear of falling and needs reminders to use L side      See Function Navigator for Current Functional Status.   Therapy/Group: Individual Therapy  Geoffrey Hynes A Rakin Lemelle 09/24/2016, 12:42 PM

## 2016-09-25 ENCOUNTER — Inpatient Hospital Stay (HOSPITAL_COMMUNITY): Payer: Medicare Other | Admitting: *Deleted

## 2016-09-25 ENCOUNTER — Inpatient Hospital Stay (HOSPITAL_COMMUNITY): Payer: Medicare Other

## 2016-09-25 LAB — GLUCOSE, CAPILLARY
GLUCOSE-CAPILLARY: 168 mg/dL — AB (ref 65–99)
GLUCOSE-CAPILLARY: 173 mg/dL — AB (ref 65–99)
GLUCOSE-CAPILLARY: 355 mg/dL — AB (ref 65–99)
GLUCOSE-CAPILLARY: 317 mg/dL — AB (ref 65–99)
Glucose-Capillary: 164 mg/dL — ABNORMAL HIGH (ref 65–99)

## 2016-09-25 LAB — URINALYSIS, COMPLETE (UACMP) WITH MICROSCOPIC
BILIRUBIN URINE: NEGATIVE
GLUCOSE, UA: NEGATIVE mg/dL
Ketones, ur: NEGATIVE mg/dL
Leukocytes, UA: NEGATIVE
NITRITE: NEGATIVE
Protein, ur: 30 mg/dL — AB
SPECIFIC GRAVITY, URINE: 1.008 (ref 1.005–1.030)
pH: 5 (ref 5.0–8.0)

## 2016-09-25 NOTE — Progress Notes (Signed)
Asja Frommer is a 83 y.o. female Feb 12, 1927 [id number]  Subjective: C/o urinary burning. No new problems. Slept well. Feeling OK.  Objective: Vital signs in last 24 hours: Temp:  [97.9 F (36.6 C)-98.1 F (36.7 C)] 97.9 F (36.6 C) (08/26 0608) Pulse Rate:  [66-85] 85 (08/26 0608) Resp:  [18] 18 (08/26 0608) BP: (132-174)/(57-73) 174/73 (08/26 0608) SpO2:  [100 %] 100 % (08/26 0608) Weight change:  Last BM Date: 09/24/16  Intake/Output from previous day: 08/25 0701 - 08/26 0700 In: 120 [P.O.:120] Out: -  Last cbgs: CBG (last 3)   Recent Labs  09/24/16 1638 09/24/16 2118 09/25/16 0655  GLUCAP 148* 168* 168*     Physical Exam General: No apparent distress   HEENT: not dry Lungs: Normal effort. Lungs clear to auscultation, no crackles or wheezes. Cardiovascular: Regular rate and rhythm, no edema Abdomen: S/NT/ND; BS(+) Musculoskeletal:  unchanged Neurological: No new neurological deficits Wounds: N/A    Skin: clear  Aging changes Mental state: Alert, oriented, cooperative    Lab Results: BMET    Component Value Date/Time   NA 141 09/23/2016 0552   K 3.9 09/23/2016 0552   CL 110 09/23/2016 0552   CO2 21 (L) 09/23/2016 0552   GLUCOSE 186 (H) 09/23/2016 0552   BUN 28 (H) 09/23/2016 0552   CREATININE 2.07 (H) 09/23/2016 0552   CALCIUM 10.0 09/23/2016 0552   GFRNONAA 21 (L) 09/23/2016 0552   GFRAA 24 (L) 09/23/2016 0552   CBC    Component Value Date/Time   WBC 5.6 09/23/2016 0552   RBC 3.77 (L) 09/23/2016 0552   HGB 10.9 (L) 09/23/2016 0552   HCT 33.6 (L) 09/23/2016 0552   PLT 218 09/23/2016 0552   MCV 89.1 09/23/2016 0552   MCH 28.9 09/23/2016 0552   MCHC 32.4 09/23/2016 0552   RDW 14.0 09/23/2016 0552   LYMPHSABS 1.8 09/23/2016 0552   MONOABS 0.6 09/23/2016 0552   EOSABS 0.2 09/23/2016 0552   BASOSABS 0.0 09/23/2016 0552    Studies/Results: No results found.  Medications: I have reviewed the patient's current  medications.  Assessment/Plan:     1. L hemiparesis - cont w/CIR 2. R PLIC CVA - on Plavix 3. HTN - cont w/Lopressor, Imdur, Cozaar 4. DM-2 - Insulin SS, Glucotrol 5. CAD Plavix, Imdur 6. CKD3 - good water intake 7. Dyslipidemia - on Lipitpr 8. Dysuria - check UA     Length of stay, days: 3  Walker Kehr , MD 09/25/2016, 8:20 AM

## 2016-09-25 NOTE — Plan of Care (Signed)
Problem: RH KNOWLEDGE DEFICIT Goal: RH STG INCREASE KNOWLEDGE OF DIABETES Pt will be knowledgeable about diabetes management

## 2016-09-25 NOTE — Progress Notes (Signed)
Physical Therapy Session Note  Patient Details  Name: [name] MRN: [id number] Date of Birth: 05/25/1927  Today's Date: 09/25/2016 PT Individual Time: 0905-1010 PT Individual Time Calculation (min): 65 min   Short Term Goals: Week 1:  PT Short Term Goal 1 (Week 1): Pt will transfer bed<>chair w/ supervision PT Short Term Goal 2 (Week 1): Pt will ambulate 96' w/ Min A using LRAD PT Short Term Goal 3 (Week 1): Pt will maintain dynamic standing balance w/ Min A  PT Short Term Goal 4 (Week 1): Pt will self-propel w/c w/ Min A 50' PT Short Term Goal 5 (Week 1): Pt will perform all bed mobility w/ supervision  Skilled Therapeutic Interventions/Progress Updates:  Tx focused on functional mobility training, gait with RW, and NMR via forced use, manual facilitation, and multi-modal cues during self-care tasks and transtiional movements. Pt up in bed, getting ready for the day. No complaints.   Pt performed cleaning and dressing in bed and up in WC at sink. See Function tab. Pt needed encouragement for independent task initiation.   Supine>sit via rolling with cues for technique, mod A trunk lifting.  Sit<>stand x10 throughout tx with min/Mod A depending on surface height and cues for reciprocal scooting, LUE use, and NMR technique for LLE forced use. Pt performed dynamic sitting balance tasks during bathing and dressing with cues for safety and sequence.   Gait 3x15' with RW and up to Mod A for balance and cues for LLE ext in stance.   WC propulsion x50' with up to Max A for advancing and cues for steering technique.   NMR at // bars with mirror for visual feedback during squats, step taps with UE support and lateral weight shifts without UEs.  Seated NMR for ankle, hip and knee activation in maximal AROM for ankle pumps, LAQ, and marching.  Pt let up in [facility] with all needs in reach.        Therapy Documentation Precautions:  Precautions Precautions: Fall Precaution Comments: L  hemi Restrictions Weight Bearing Restrictions: No General:   Vital Signs: Therapy Vitals Temp: 97.9 F (36.6 C) Temp Source: Oral Pulse Rate: 85 Resp: 18 BP: (!) 174/73 Patient Position (if appropriate): Lying Oxygen Therapy SpO2: 100 % O2 Device: Not Delivered Pain:none   See Function Navigator for Current Functional Status.   Therapy/Group: Individual Therapy  Constance Hackenberg, Corinna Lines, PT, DPT  09/25/2016, 9:41 AM

## 2016-09-26 ENCOUNTER — Inpatient Hospital Stay (HOSPITAL_COMMUNITY): Payer: Medicare Other | Admitting: Physical Therapy

## 2016-09-26 ENCOUNTER — Inpatient Hospital Stay (HOSPITAL_COMMUNITY): Payer: Medicare Other | Admitting: Occupational Therapy

## 2016-09-26 ENCOUNTER — Inpatient Hospital Stay (HOSPITAL_COMMUNITY): Payer: Medicare Other

## 2016-09-26 LAB — GLUCOSE, CAPILLARY
GLUCOSE-CAPILLARY: 222 mg/dL — AB (ref 65–99)
GLUCOSE-CAPILLARY: 190 mg/dL — AB (ref 65–99)
GLUCOSE-CAPILLARY: 182 mg/dL — AB (ref 65–99)
Glucose-Capillary: 249 mg/dL — ABNORMAL HIGH (ref 65–99)

## 2016-09-26 MED ORDER — GLIPIZIDE 10 MG PO TABS
5.0000 mg | ORAL_TABLET | Freq: Every day | ORAL | Status: DC
  Administered 2016-09-27 – 2016-10-01 (×5): 5 mg via ORAL
  Filled 2016-09-26 (×5): qty 1

## 2016-09-26 NOTE — Progress Notes (Signed)
Physical Therapy Note  Patient Details  Name: [name] MRN: [id number] Date of Birth: 18-Mar-1927 Today's Date: 09/26/2016  0071-2197, 45 min individual tx Pain: none per pt  W/c propulsion using hemi method x 50' with mod assist, with poor coordination of LLE for steernng.  Stand pivot w/c> mat to L with min /mod assist. Pt has poor translation of pelvis over femurs (R>L).  Neuromuscular re-education via forced use, mulitmodal cues for sustained stretch bil heel cords in sitting x 2 min and standing x 1 min 30 seconds ,with feet on wedge.  Pt felt  stretch more strongly R calf. Seated midline orientation with min cues to prevent L lean. Gait over level tile x 22' with mod assist, mod cues for upright posture, forward gaze, RW use; Trendelenburg deviation evident.  Up/down (8) 3" high steps, 2 rails, ascending and descending forward with step-to method, min/mod assist.  Min cues for technique.  Pt left resting in w/c with all needs within reach.  See function navigator for current status.  [name] 09/26/2016, 7:54 AM

## 2016-09-26 NOTE — Progress Notes (Signed)
Subjective/Complaints:   Objective: Vital Signs: Blood pressure (!) 171/71, pulse 88, temperature 98.2 F (36.8 C), temperature source Oral, resp. rate 18, height 5\' 3"  (1.6 m), weight 76.7 kg (169 lb 3.2 oz), SpO2 100 %. No results found. Results for orders placed or performed during the hospital encounter of 09/22/16 (from the past 72 hour(s))  Glucose, capillary     Status: Abnormal   Collection Time: 09/23/16 11:56 AM  Result Value Ref Range   Glucose-Capillary 225 (H) 65 - 99 mg/dL  Glucose, capillary     Status: Abnormal   Collection Time: 09/23/16  4:41 PM  Result Value Ref Range   Glucose-Capillary 208 (H) 65 - 99 mg/dL  Glucose, capillary     Status: Abnormal   Collection Time: 09/23/16  8:37 PM  Result Value Ref Range   Glucose-Capillary 182 (H) 65 - 99 mg/dL  Glucose, capillary     Status: Abnormal   Collection Time: 09/24/16  6:38 AM  Result Value Ref Range   Glucose-Capillary 156 (H) 65 - 99 mg/dL  Glucose, capillary     Status: Abnormal   Collection Time: 09/24/16 12:09 PM  Result Value Ref Range   Glucose-Capillary 288 (H) 65 - 99 mg/dL  Glucose, capillary     Status: Abnormal   Collection Time: 09/24/16  4:38 PM  Result Value Ref Range   Glucose-Capillary 148 (H) 65 - 99 mg/dL  Glucose, capillary     Status: Abnormal   Collection Time: 09/24/16  9:18 PM  Result Value Ref Range   Glucose-Capillary 168 (H) 65 - 99 mg/dL  Glucose, capillary     Status: Abnormal   Collection Time: 09/25/16  6:55 AM  Result Value Ref Range   Glucose-Capillary 168 (H) 65 - 99 mg/dL  Urinalysis, Complete w Microscopic     Status: Abnormal   Collection Time: 09/25/16 10:24 AM  Result Value Ref Range   Color, Urine STRAW (A) YELLOW   APPearance CLEAR CLEAR   Specific Gravity, Urine 1.008 1.005 - 1.030   pH 5.0 5.0 - 8.0   Glucose, UA NEGATIVE NEGATIVE mg/dL   Hgb urine dipstick MODERATE (A) NEGATIVE   Bilirubin Urine NEGATIVE NEGATIVE   Ketones, ur NEGATIVE NEGATIVE mg/dL    Protein, ur 30 (A) NEGATIVE mg/dL   Nitrite NEGATIVE NEGATIVE   Leukocytes, UA NEGATIVE NEGATIVE   RBC / HPF 0-5 0 - 5 RBC/hpf   WBC, UA 0-5 0 - 5 WBC/hpf   Bacteria, UA RARE (A) NONE SEEN   Squamous Epithelial / LPF 0-5 (A) NONE SEEN  Glucose, capillary     Status: Abnormal   Collection Time: 09/25/16 11:45 AM  Result Value Ref Range   Glucose-Capillary 173 (H) 65 - 99 mg/dL  Glucose, capillary     Status: Abnormal   Collection Time: 09/25/16  4:17 PM  Result Value Ref Range   Glucose-Capillary 164 (H) 65 - 99 mg/dL  Glucose, capillary     Status: Abnormal   Collection Time: 09/25/16  9:27 PM  Result Value Ref Range   Glucose-Capillary 355 (H) 65 - 99 mg/dL  Glucose, capillary     Status: Abnormal   Collection Time: 09/25/16  9:31 PM  Result Value Ref Range   Glucose-Capillary 317 (H) 65 - 99 mg/dL  Glucose, capillary     Status: Abnormal   Collection Time: 09/26/16  6:28 AM  Result Value Ref Range   Glucose-Capillary 222 (H) 65 - 99 mg/dL     HEENT: normal  Cardio: RRR and no murmur Resp: CTA B/L and unlabored GI: BS positive and NT, ND Extremity:  No Edema Skin:   Intact Neuro: Alert/Oriented, Normal Sensory, Abnormal Motor 3- Left delt bi tri grip, HF, KE ADF and Abnormal FMC Ataxic/ dec FMC Musc/Skel:  Other no pain with UE or LE ROM Gen NAD   Assessment/Plan: 1. 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 Physiatrist is providing close team supervision and 24 hour management of active medical problems listed below. Physiatrist and rehab team continue to assess barriers to discharge/monitor patient progress toward functional and medical goals. FIM: Function - Bathing Position: Shower Body parts bathed by patient: Left arm, Chest, Abdomen, Front perineal area, Right upper leg, Left upper leg Body parts bathed by helper: Right arm, Buttocks, Right lower  leg, Left lower leg, Back Assist Level: Touching or steadying assistance(Pt > 75%)  Function- Upper Body Dressing/Undressing What is the patient wearing?: Pull over shirt/dress Pull over shirt/dress - Perfomed by patient: Thread/unthread right sleeve, Thread/unthread left sleeve, Put head through opening, Pull shirt over trunk Pull over shirt/dress - Perfomed by helper: Thread/unthread left sleeve Assist Level: Supervision or verbal cues Function - Lower Body Dressing/Undressing What is the patient wearing?: Non-skid slipper socks, Pants, Underwear Position: Wheelchair/chair at sink Underwear - Performed by patient: Thread/unthread left underwear leg, Thread/unthread right underwear leg Underwear - Performed by helper: Pull underwear up/down Pants- Performed by patient: Thread/unthread left pants leg, Thread/unthread right pants leg Pants- Performed by helper: Pull pants up/down Non-skid slipper socks- Performed by patient: Don/doff right sock Non-skid slipper socks- Performed by helper: Don/doff left sock Assist for footwear: Partial/moderate assist Assist for lower body dressing: Touching or steadying assistance (Pt > 75%)  Function - Toileting Toileting steps completed by patient: Adjust clothing prior to toileting, Performs perineal hygiene Toileting steps completed by helper: Adjust clothing prior to toileting, Adjust clothing after toileting Toileting Assistive Devices: Grab bar or rail Assist level: Touching or steadying assistance (Pt.75%)  Function - Air cabin crew transfer assistive device: Bedside commode Assist level to bedside commode (at bedside): Moderate assist (Pt 50 - 74%/lift or lower) Assist level from bedside commode (at bedside): Moderate assist (Pt 50 - 74%/lift or lower)  Function - Chair/bed transfer Chair/bed transfer method: Stand pivot Chair/bed transfer assist level: Moderate assist (Pt 50 - 74%/lift or lower) Chair/bed transfer assistive  device: Armrests, Walker Chair/bed transfer details: Verbal cues for technique, Verbal cues for sequencing, Manual facilitation for weight shifting, Manual facilitation for placement, Tactile cues for weight shifting  Function - Locomotion: Wheelchair Will patient use wheelchair at discharge?: Yes Type: Manual Max wheelchair distance: 50 Assist Level: Maximal assistance (Pt 25 - 49%) Wheel 50 feet with 2 turns activity did not occur: Safety/medical concerns Assist Level: Maximal assistance (Pt 25 - 49%) Wheel 150 feet activity did not occur: Safety/medical concerns Turns around,maneuvers to table,bed, and toilet,negotiates 3% grade,maneuvers on rugs and over doorsills: No Function - Locomotion: Ambulation Assistive device: Walker-rolling Max distance: 15 Assist level: Moderate assist (Pt 50 - 74%) Assist level: Moderate assist (Pt 50 - 74%) Walk 50 feet with 2 turns activity did not occur: Safety/medical concerns Walk 150 feet activity did not occur: Safety/medical concerns Walk 10 feet on uneven surfaces activity did not occur: Safety/medical concerns  Function - Comprehension Comprehension: Auditory Comprehension assist level: Follows basic conversation/direction with no assist  Function - Expression Expression: Verbal Expression assist level:  Expresses complex ideas: With no assist  Function - Social Interaction Social Interaction assist level: Interacts appropriately 90% of the time - Needs monitoring or encouragement for participation or interaction.  Function - Problem Solving Problem solving assist level: Solves basic 90% of the time/requires cueing < 10% of the time  Function - Memory Memory assist level: Recognizes or recalls 90% of the time/requires cueing < 10% of the time Patient normally able to recall (first 3 days only): Current season, That he or she is in a hospital, Location of own room  Medical Problem List and Plan: 1. Left hemiparesissecondary to right  PLICinfarct Cont CIR PT, OT 2. DVT Prophylaxis/Anticoagulation: Subcutaneous heparin. Monitor platelet counts and any signs of bleeding 3. Pain Management: Tylenol as needed 4. Mood: Provide emotional support 5. Neuropsych: This patient iscapable of making decisions on herown behalf. 6. Skin/Wound Care: Routine skin checks 7. Fluids/Electrolytes/Nutrition: Routine I&O with follow-up chemistries 8.Hypertension. Norvasc 10 mg daily, Cozaar 50 mg twice a day, Lopressor 50 mg daily, Imdur 60 mg daily. Monitor with increased mobility, may be permissive with Sys BP up to 180 for next wk Vitals:   09/25/16 1452 09/26/16 0614  BP: (!) 148/55 (!) 171/71  Pulse: 68 88  Resp: 18 18  Temp: 97.8 F (36.6 C) 98.2 F (36.8 C)  SpO2: 100% 100%   9.Diabetes mellitus with peripheral neuropathy. Hemoglobin A1c 7.7. SSI. Patient on Glucotrol 5 mg daily prior to admission. Resume 5mg  qam 8/27 CBG (last 3)   Recent Labs  09/25/16 2127 09/25/16 2131 09/26/16 0628  GLUCAP 355* 317* 222*    10.CAD with CABG. Continue Plavix. No chest pain or shortness of breath 11.CKD stage III. Creatinine 2.04 on admission. Follow-up chemistries 12.Hyperlipidemia. Lipitor  LOS (Days) 4 A FACE TO FACE EVALUATION WAS PERFORMED  Corley Kohls E 09/26/2016, 7:35 AM

## 2016-09-26 NOTE — Progress Notes (Signed)
Physical Therapy Session Note  Patient Details  Name: [name] MRN: [id number] Date of Birth: Sep 05, 1927  Today's Date: 09/26/2016 PT Individual Time: 1400-1526 PT Individual Time Calculation (min): 86 min   Short Term Goals: Week 1:  PT Short Term Goal 1 (Week 1): Pt will transfer bed<>chair w/ supervision PT Short Term Goal 2 (Week 1): Pt will ambulate 68' w/ Min A using LRAD PT Short Term Goal 3 (Week 1): Pt will maintain dynamic standing balance w/ Min A  PT Short Term Goal 4 (Week 1): Pt will self-propel w/c w/ Min A 50' PT Short Term Goal 5 (Week 1): Pt will perform all bed mobility w/ supervision  Skilled Therapeutic Interventions/Progress Updates:    no c/o pain.  Session focus on activity tolerance, NMR, and functional mobility retraining.    Pt completes sit<>stand transfers throughout session with min guard fade to supervision with min verbal cues for hand placement and forward weight shift.  Gait training 2x32' with RW and LLE DF assist ace wrap with min assist, mod multimodal cues for step length, walker positioning, and upright posture.  Pt requires seated rest break between trials 2/2 fatigue.  nustep x12 minutes at level 4 for reciprocal stepping pattern retraining, trunk rotation NMR, and activity tolerance.  NMR via L/R toe taps 2x10 reps with min guard for safety and tactile/verbal cues for L knee extension during stance phase.  Repeated sit<>stand 2x5 reps focus on forward weight shift, midline, and controlled descent.  Pt completes 3x10 reps minisquats in standing focus on equal knee flexion/extension bilaterally and controlled movements.  Standing LLE ball kicks for postural control and SLS retraining with RW for safety.  Pt returned to room at end of session, positioned in w/c with call bell in reach and needs met.    Therapy Documentation Precautions:  Precautions Precautions: Fall Precaution Comments: L hemi Restrictions Weight Bearing Restrictions: No   See  Function Navigator for Current Functional Status.   Therapy/Group: Individual Therapy  [name] 09/26/2016, 3:32 PM

## 2016-09-26 NOTE — Progress Notes (Signed)
Pt refused to allow RN to D/C IV. Pt said she would prefer to not be stuck again if she were to need IV fluids. RN explained to her that her IV would not remain useable if it was left in for an extended period of time and that it was unlikely for her to need an IV for the remainder of the stay. Pt does not prefer to have it removed at this time

## 2016-09-26 NOTE — Progress Notes (Signed)
Occupational Therapy Session Note  Patient Details  Name: [name] MRN: [id number] Date of Birth: 04/21/1927  Today's Date: 09/26/2016 OT Individual Time: 3112-1624 OT Individual Time Calculation (min): 57 min    Short Term Goals: Week 1:  OT Short Term Goal 1 (Week 1): Pt will complete toilet transfers with min A  OT Short Term Goal 2 (Week 1): Pt will completed UB dressing while sitting using hemi dressing techniques  OT Short Term Goal 3 (Week 1): Pt will tolerate standing while brushing her teeth  OT Short Term Goal 4 (Week 1): Pt will complete LB dressing with min A  OT Short Term Goal 5 (Week 1): Pt will complete shower transfer with min A   Skilled Therapeutic Interventions/Progress Updates:    Treatment session with focus on functional mobility, sit > stand, and functional use of LUE.  Pt ambulated to bathroom with RW and mod assist for sit > stand with light lifting assistance.  Pt able to complete clothing management and hygiene with encouragement and min assist for standing balance.  In shower, therapist provided hand over hand assist to incorporate LUE into bathing Rt arm.  Min assist for standing balance while washing buttocks.  Completed dressing with education on hemi-dressing technique to increase success with LB dressing, pt able to maintain figure 4 position once setup by therapist.  Backward chaining when donning Rt sock due to decreased grip strength.  Hand over hand to stabilize deodorant on Lt hand while managing lid.  Engaged in [place] with focus on fine and gross motor control with picking up medium sized beads and placing on table.  Therapist providing min tactile cues at elbow when lifting arm up to place items on table.  Pt stacked two blocks on top x3 with increased time and tactile cues at elbow.  Left upright in w/c with lunch tray, assisted to open drink.  Therapy Documentation Precautions:  Precautions Precautions: Fall Precaution Comments: L  hemi Restrictions Weight Bearing Restrictions: No General:   Vital Signs: Therapy Vitals Temp: 98.3 F (36.8 C) Temp Source: Oral Pulse Rate: 70 Resp: 18 BP: (!) 133/54 Patient Position (if appropriate): Sitting Oxygen Therapy SpO2: 99 % Pain:  Pt with no c/o pain  See Function Navigator for Current Functional Status.   Therapy/Group: Individual Therapy  Simonne Come 09/26/2016, 3:48 PM

## 2016-09-27 ENCOUNTER — Inpatient Hospital Stay (HOSPITAL_COMMUNITY): Payer: Medicare Other | Admitting: Physical Therapy

## 2016-09-27 ENCOUNTER — Inpatient Hospital Stay (HOSPITAL_COMMUNITY): Payer: Medicare Other | Admitting: Occupational Therapy

## 2016-09-27 LAB — GLUCOSE, CAPILLARY
GLUCOSE-CAPILLARY: 176 mg/dL — AB (ref 65–99)
GLUCOSE-CAPILLARY: 255 mg/dL — AB (ref 65–99)
Glucose-Capillary: 182 mg/dL — ABNORMAL HIGH (ref 65–99)
Glucose-Capillary: 140 mg/dL — ABNORMAL HIGH (ref 65–99)

## 2016-09-27 NOTE — Progress Notes (Signed)
Subjective/Complaints:  Good participation with PT, Mod A gait with PT Objective: Vital Signs: Blood pressure (!) 163/60, pulse 76, temperature 98 F (36.7 C), temperature source Oral, resp. rate 18, height 5\' 3"  (1.6 m), weight 76.7 kg (169 lb 3.2 oz), SpO2 100 %. No results found. Results for orders placed or performed during the hospital encounter of 09/22/16 (from the past 72 hour(s))  Glucose, capillary     Status: Abnormal   Collection Time: 09/24/16 12:09 PM  Result Value Ref Range   Glucose-Capillary 288 (H) 65 - 99 mg/dL  Glucose, capillary     Status: Abnormal   Collection Time: 09/24/16  4:38 PM  Result Value Ref Range   Glucose-Capillary 148 (H) 65 - 99 mg/dL  Glucose, capillary     Status: Abnormal   Collection Time: 09/24/16  9:18 PM  Result Value Ref Range   Glucose-Capillary 168 (H) 65 - 99 mg/dL  Glucose, capillary     Status: Abnormal   Collection Time: 09/25/16  6:55 AM  Result Value Ref Range   Glucose-Capillary 168 (H) 65 - 99 mg/dL  Urinalysis, Complete w Microscopic     Status: Abnormal   Collection Time: 09/25/16 10:24 AM  Result Value Ref Range   Color, Urine STRAW (A) YELLOW   APPearance CLEAR CLEAR   Specific Gravity, Urine 1.008 1.005 - 1.030   pH 5.0 5.0 - 8.0   Glucose, UA NEGATIVE NEGATIVE mg/dL   Hgb urine dipstick MODERATE (A) NEGATIVE   Bilirubin Urine NEGATIVE NEGATIVE   Ketones, ur NEGATIVE NEGATIVE mg/dL   Protein, ur 30 (A) NEGATIVE mg/dL   Nitrite NEGATIVE NEGATIVE   Leukocytes, UA NEGATIVE NEGATIVE   RBC / HPF 0-5 0 - 5 RBC/hpf   WBC, UA 0-5 0 - 5 WBC/hpf   Bacteria, UA RARE (A) NONE SEEN   Squamous Epithelial / LPF 0-5 (A) NONE SEEN  Glucose, capillary     Status: Abnormal   Collection Time: 09/25/16 11:45 AM  Result Value Ref Range   Glucose-Capillary 173 (H) 65 - 99 mg/dL  Glucose, capillary     Status: Abnormal   Collection Time: 09/25/16  4:17 PM  Result Value Ref Range   Glucose-Capillary 164 (H) 65 - 99 mg/dL   Glucose, capillary     Status: Abnormal   Collection Time: 09/25/16  9:27 PM  Result Value Ref Range   Glucose-Capillary 355 (H) 65 - 99 mg/dL  Glucose, capillary     Status: Abnormal   Collection Time: 09/25/16  9:31 PM  Result Value Ref Range   Glucose-Capillary 317 (H) 65 - 99 mg/dL  Glucose, capillary     Status: Abnormal   Collection Time: 09/26/16  6:28 AM  Result Value Ref Range   Glucose-Capillary 222 (H) 65 - 99 mg/dL  Glucose, capillary     Status: Abnormal   Collection Time: 09/26/16 12:00 PM  Result Value Ref Range   Glucose-Capillary 190 (H) 65 - 99 mg/dL  Glucose, capillary     Status: Abnormal   Collection Time: 09/26/16  4:27 PM  Result Value Ref Range   Glucose-Capillary 182 (H) 65 - 99 mg/dL  Glucose, capillary     Status: Abnormal   Collection Time: 09/26/16  8:44 PM  Result Value Ref Range   Glucose-Capillary 249 (H) 65 - 99 mg/dL  Glucose, capillary     Status: Abnormal   Collection Time: 09/27/16  7:01 AM  Result Value Ref Range   Glucose-Capillary 182 (H) 65 -  99 mg/dL     HEENT: normal Cardio: RRR and no murmur Resp: CTA B/L and unlabored GI: BS positive and NT, ND Extremity:  No Edema Skin:   Intact Neuro: Alert/Oriented, Normal Sensory, Abnormal Motor 3- Left delt bi tri grip, HF, KE ADF and Abnormal FMC Ataxic/ dec FMC Musc/Skel:  Other no pain with UE or LE ROM Gen NAD   Assessment/Plan: 1. 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 Physiatrist is providing close team supervision and 24 hour management of active medical problems listed below. Physiatrist and rehab team continue to assess barriers to discharge/monitor patient progress toward functional and medical goals. FIM: Function - Bathing Position: Shower Body parts bathed by patient: Left arm, Chest, Abdomen, Front perineal area, Right upper leg, Left upper leg Body parts  bathed by helper: Right arm, Buttocks, Right lower leg, Left lower leg, Back Assist Level:  (Mod assist)  Function- Upper Body Dressing/Undressing What is the patient wearing?: Pull over shirt/dress Pull over shirt/dress - Perfomed by patient: Thread/unthread right sleeve, Thread/unthread left sleeve, Put head through opening, Pull shirt over trunk Pull over shirt/dress - Perfomed by helper: Thread/unthread left sleeve Assist Level: Supervision or verbal cues, Set up Set up : To obtain clothing/put away Function - Lower Body Dressing/Undressing What is the patient wearing?: Underwear, Pants, Non-skid slipper socks Position: Wheelchair/chair at sink Underwear - Performed by patient: Thread/unthread left underwear leg, Thread/unthread right underwear leg Underwear - Performed by helper: Pull underwear up/down Pants- Performed by patient: Thread/unthread left pants leg, Thread/unthread right pants leg Pants- Performed by helper: Pull pants up/down Non-skid slipper socks- Performed by patient: Don/doff left sock Non-skid slipper socks- Performed by helper: Don/doff right sock Assist for footwear: Partial/moderate assist Assist for lower body dressing:  (Mod assist)  Function - Toileting Toileting steps completed by patient: Performs perineal hygiene, Adjust clothing prior to toileting Toileting steps completed by helper: Adjust clothing after toileting Toileting Assistive Devices: Grab bar or rail Assist level:  (Mod assist)  Function - Air cabin crew transfer assistive device: Elevated toilet seat/BSC over toilet, Walker Assist level to toilet: Moderate assist (Pt 50 - 74%/lift or lower) Assist level from toilet: Moderate assist (Pt 50 - 74%/lift or lower) Assist level to bedside commode (at bedside): Moderate assist (Pt 50 - 74%/lift or lower) (per Lynda Rainwater, NT) Assist level from bedside commode (at bedside): Moderate assist (Pt 50 - 74%/lift or lower)  Function -  Chair/bed transfer Chair/bed transfer method: Stand pivot, Ambulatory Chair/bed transfer assist level: Touching or steadying assistance (Pt > 75%) Chair/bed transfer assistive device: Armrests, Walker Chair/bed transfer details: Manual facilitation for weight shifting, Verbal cues for technique  Function - Locomotion: Wheelchair Will patient use wheelchair at discharge?: Yes Type: Manual Max wheelchair distance: 50 Assist Level: Moderate assistance (Pt 50 - 74%) Wheel 50 feet with 2 turns activity did not occur: Safety/medical concerns Assist Level: Moderate assistance (Pt 50 - 74%) Wheel 150 feet activity did not occur: Safety/medical concerns Turns around,maneuvers to table,bed, and toilet,negotiates 3% grade,maneuvers on rugs and over doorsills: No Function - Locomotion: Ambulation Assistive device: Walker-rolling Max distance: 35 Assist level: Touching or steadying assistance (Pt > 75%) Assist level: Touching or steadying assistance (Pt > 75%) Walk 50 feet with 2 turns activity did not occur: Safety/medical concerns Walk 150 feet activity did not occur: Safety/medical concerns Walk 10 feet on uneven surfaces activity did not occur: Safety/medical concerns  Function - Comprehension Comprehension: Auditory Comprehension assist level: Follows basic conversation/direction with no assist  Function - Expression Expression: Verbal Expression assist level: Expresses basic needs/ideas: With no assist  Function - Social Interaction Social Interaction assist level: Interacts appropriately 90% of the time - Needs monitoring or encouragement for participation or interaction.  Function - Problem Solving Problem solving assist level: Solves basic 90% of the time/requires cueing < 10% of the time  Function - Memory Memory assist level: Recognizes or recalls 90% of the time/requires cueing < 10% of the time, Requires cues to use assistive device Patient normally able to recall (first 3  days only): Current season, That he or she is in a hospital, Location of own room  Medical Problem List and Plan: 1. Left hemiparesissecondary to right PLICinfarct- plavix  Cont CIR PT, OT 2. DVT Prophylaxis/Anticoagulation: Subcutaneous heparin. Monitor platelet counts and any signs of bleeding 3. Pain Management: Tylenol as needed 4. Mood: Provide emotional support 5. Neuropsych: This patient iscapable of making decisions on herown behalf. 6. Skin/Wound Care: Routine skin checks 7. Fluids/Electrolytes/Nutrition: Routine I&O with follow-up chemistries 8.Hypertension. Norvasc 10 mg daily, Cozaar 50 mg twice a day, Lopressor 50 mg daily, Imdur 60 mg daily. Monitor with increased mobility, may be permissive with Sys BP up to 180 for next wk Vitals:   09/26/16 1345 09/27/16 0600  BP: (!) 133/54 (!) 163/60  Pulse: 70 76  Resp: 18   Temp: 98.3 F (36.8 C) 98 F (36.7 C)  SpO2: 99% 100%   9.Diabetes mellitus with peripheral neuropathy. Hemoglobin A1c 7.7. SSI. Patient on Glucotrol 5 mg daily prior to admission. Resume 5mg  qam 8/28 CBG (last 3)   Recent Labs  09/26/16 1627 09/26/16 2044 09/27/16 0701  GLUCAP 182* 249* 182*    10.CAD with CABG. Continue Plavix. No chest pain or shortness of breath 11.CKD stage III. Creatinine 2.04 on admission. Follow-up chemistries 12.Hyperlipidemia. Lipitor  LOS (Days) 5 A FACE TO FACE EVALUATION WAS PERFORMED  Salomon Ganser E 09/27/2016, 7:05 AM

## 2016-09-27 NOTE — Plan of Care (Signed)
Problem: RH BLADDER ELIMINATION Goal: RH STG MANAGE BLADDER WITH ASSISTANCE STG Manage Bladder With mod I Assistance   Outcome: Progressing  09/27/16 0301  Bladder Management Goals  STG: Pt will manage bladder with assistance 3-Moderate assistance    Problem: RH SKIN INTEGRITY Goal: RH STG SKIN FREE OF INFECTION/BREAKDOWN With mod I assist   Outcome: Progressing Pt skin will remain free of breakdown during hospital stay  Problem: RH PAIN MANAGEMENT Goal: RH STG PAIN MANAGED AT OR BELOW PT'S PAIN GOAL Pain < or + 2/10 on numeric pain scale  Outcome: Progressing Pt's pain level will be less than 3 during hospital stay

## 2016-09-27 NOTE — Progress Notes (Signed)
Physical Therapy Session Note  Patient Details  Name: [name] MRN: [id number] Date of Birth: 07-23-1927  Today's Date: 09/27/2016 PT Individual Time: 1000-1100 AND [id number] PT Individual Time Calculation (min): 60 min AND 32 min  Short Term Goals: Week 1:  PT Short Term Goal 1 (Week 1): Pt will transfer bed<>chair w/ supervision PT Short Term Goal 2 (Week 1): Pt will ambulate 58' w/ Min A using LRAD PT Short Term Goal 3 (Week 1): Pt will maintain dynamic standing balance w/ Min A  PT Short Term Goal 4 (Week 1): Pt will self-propel w/c w/ Min A 50' PT Short Term Goal 5 (Week 1): Pt will perform all bed mobility w/ supervision  Skilled Therapeutic Interventions/Progress Updates:   Session 1:  Pt in w/c upon arrival and agreeable to therapy, no c/o pain.   LE strengthening exercises w/ emphasis on LLE strengthening within functional tasks: -5x sit<>stand w/ UEs on knees and 5x sit<>stand w/o UE assist, verbal and visual cues for technique and use of mirror feedback to decrease flexed trunk posture -LLE step up, 3" step w/ UE support on RW, 10x3  Dynamic standing balance/tolerance:  -R UE tasks in standing w/ LUE support and supervision, 3-4 min bouts x2, verbal cues to increase upright posture and tighten L quad musculature in stance   Seated rest secondary to fatigue in between tasks, fatigue resolves w/ rest.   Ambulated 38' w/ Min guard using RW back towards room to work on functional mobility, Total A w/c the rest of the way for time management.   Ended session in w/c, call bell within reach and all needs met.    Session 2:  Pt in w/c upon arrival and agreeable to therapy, no c/o pain. Worked on functional mobility and endurance this session. Pt ambulated 40', 66', 23', and 60' w/ Min guard using RW. Multimodal cues for gait pattern. Seated rest breaks secondary to fatigue. Pt reporting increased fatigue this afternoon w/ OOB activity. Moderate increase in work of breathing  w/ gait bouts that resolves with rest. Ended session in w/c, call bell within reach and all needs met.   Therapy Documentation Precautions:  Precautions Precautions: Fall Precaution Comments: L hemi Restrictions Weight Bearing Restrictions: No Pain: Pain Assessment Pain Assessment: No/denies pain  See Function Navigator for Current Functional Status.   Therapy/Group: Individual Therapy  [name] 09/27/2016, 11:14 AM

## 2016-09-27 NOTE — Progress Notes (Signed)
Occupational Therapy Session Note  Patient Details  Name: [name] MRN: [id number] Date of Birth: Jan 23, 1928  Today's Date: 09/27/2016  Session 1 OT Individual Time: 0901-1000 OT Individual Time Calculation (min): 59 min   Session 2 OT Individual Time: 1300-1330 OT Individual Time Calculation (min): 30 min    Short Term Goals: Week 1:  OT Short Term Goal 1 (Week 1): Pt will complete toilet transfers with min A  OT Short Term Goal 2 (Week 1): Pt will completed UB dressing while sitting using hemi dressing techniques  OT Short Term Goal 3 (Week 1): Pt will tolerate standing while brushing her teeth  OT Short Term Goal 4 (Week 1): Pt will complete LB dressing with min A  OT Short Term Goal 5 (Week 1): Pt will complete shower transfer with min A   Skilled Therapeutic Interventions/Progress Updates:  Session 1   OT treatment session focused on modified bathing/dressing, activity tolerance, and functional use of L UE. Pt ambulated to shower with RW, min A, and mod instructional cues for safe use of RW while turning to sit onto tub bench. Pt able to doff socks with min A for positioning LEs. Bathing tasks completed with hand-over hand A to integrate L UE. Pt with difficulty w/ proprioceptive feedback pushing too hard and making it difficult to move wash cloth across body parts. Stand-pivot to transfer out of shower with grab bars and min A. Pt completed dressing tasks with forced [place] of L UE to reach and pull up pants and socks. Min A for sit<>stand and min A for standing balance. Pt able to grasp deodorant bottle with L hand today with encouragement from OT, then provided elbow support to reach across midline and place under arm. Pt fatigues quickly and required seated rest breaks in between ADL tasks. Pt left seated in wc at end of session with needs met.   Session 2 OT treatment session focused on L UE fine motor coordination and L NMR. Pt brought to therapy gym and completed stand-pivot to  therapy mat. Utilized graded peg board task to work on Scientist, research (life sciences) of medium sized pegs. Pt needed elbow and wrist support to facilitate normal movement patterns during task. Also incorporated shoulder external rotation with placement of peg board tub with improved movement patterns with repetition. Worked on isolated wrist flex/ext with OT providing proprioceptive input. Pt returned to room at end of session and left seated in wc with needs met.   Therapy Documentation Precautions:  Precautions Precautions: Fall Precaution Comments: L hemi Restrictions Weight Bearing Restrictions: No Pain:  none/denies pain  See Function Navigator for Current Functional Status.   Therapy/Group: Individual Therapy  Valma Cava 09/27/2016, 1:34 PM

## 2016-09-28 ENCOUNTER — Inpatient Hospital Stay (HOSPITAL_COMMUNITY): Payer: Medicare Other | Admitting: Occupational Therapy

## 2016-09-28 ENCOUNTER — Inpatient Hospital Stay (HOSPITAL_COMMUNITY): Payer: Medicare Other | Admitting: Physical Therapy

## 2016-09-28 LAB — GLUCOSE, CAPILLARY
GLUCOSE-CAPILLARY: 151 mg/dL — AB (ref 65–99)
Glucose-Capillary: 125 mg/dL — ABNORMAL HIGH (ref 65–99)
Glucose-Capillary: 173 mg/dL — ABNORMAL HIGH (ref 65–99)
Glucose-Capillary: 204 mg/dL — ABNORMAL HIGH (ref 65–99)

## 2016-09-28 NOTE — Progress Notes (Signed)
Subjective/Complaints: No issues overnite, very enthusiastic about therapy ROS: neg CP, SOB, N/V/D Objective: Vital Signs: Blood pressure (!) 152/63, pulse 73, temperature 98.2 F (36.8 C), temperature source Oral, resp. rate 18, height '5\' 3"'$  (1.6 m), weight 76.7 kg (169 lb 3.2 oz), SpO2 100 %. No results found. Results for orders placed or performed during the hospital encounter of 09/22/16 (from the past 72 hour(s))  Urinalysis, Complete w Microscopic     Status: Abnormal   Collection Time: 09/25/16 10:24 AM  Result Value Ref Range   Color, Urine STRAW (A) YELLOW   APPearance CLEAR CLEAR   Specific Gravity, Urine 1.008 1.005 - 1.030   pH 5.0 5.0 - 8.0   Glucose, UA NEGATIVE NEGATIVE mg/dL   Hgb urine dipstick MODERATE (A) NEGATIVE   Bilirubin Urine NEGATIVE NEGATIVE   Ketones, ur NEGATIVE NEGATIVE mg/dL   Protein, ur 30 (A) NEGATIVE mg/dL   Nitrite NEGATIVE NEGATIVE   Leukocytes, UA NEGATIVE NEGATIVE   RBC / HPF 0-5 0 - 5 RBC/hpf   WBC, UA 0-5 0 - 5 WBC/hpf   Bacteria, UA RARE (A) NONE SEEN   Squamous Epithelial / LPF 0-5 (A) NONE SEEN  Glucose, capillary     Status: Abnormal   Collection Time: 09/25/16 11:45 AM  Result Value Ref Range   Glucose-Capillary 173 (H) 65 - 99 mg/dL  Glucose, capillary     Status: Abnormal   Collection Time: 09/25/16  4:17 PM  Result Value Ref Range   Glucose-Capillary 164 (H) 65 - 99 mg/dL  Glucose, capillary     Status: Abnormal   Collection Time: 09/25/16  9:27 PM  Result Value Ref Range   Glucose-Capillary 355 (H) 65 - 99 mg/dL  Glucose, capillary     Status: Abnormal   Collection Time: 09/25/16  9:31 PM  Result Value Ref Range   Glucose-Capillary 317 (H) 65 - 99 mg/dL  Glucose, capillary     Status: Abnormal   Collection Time: 09/26/16  6:28 AM  Result Value Ref Range   Glucose-Capillary 222 (H) 65 - 99 mg/dL  Glucose, capillary     Status: Abnormal   Collection Time: 09/26/16 12:00 PM  Result Value Ref Range   Glucose-Capillary  190 (H) 65 - 99 mg/dL  Glucose, capillary     Status: Abnormal   Collection Time: 09/26/16  4:27 PM  Result Value Ref Range   Glucose-Capillary 182 (H) 65 - 99 mg/dL  Glucose, capillary     Status: Abnormal   Collection Time: 09/26/16  8:44 PM  Result Value Ref Range   Glucose-Capillary 249 (H) 65 - 99 mg/dL  Glucose, capillary     Status: Abnormal   Collection Time: 09/27/16  7:01 AM  Result Value Ref Range   Glucose-Capillary 182 (H) 65 - 99 mg/dL  Glucose, capillary     Status: Abnormal   Collection Time: 09/27/16 11:41 AM  Result Value Ref Range   Glucose-Capillary 140 (H) 65 - 99 mg/dL  Glucose, capillary     Status: Abnormal   Collection Time: 09/27/16  4:53 PM  Result Value Ref Range   Glucose-Capillary 176 (H) 65 - 99 mg/dL  Glucose, capillary     Status: Abnormal   Collection Time: 09/27/16  9:45 PM  Result Value Ref Range   Glucose-Capillary 255 (H) 65 - 99 mg/dL  Glucose, capillary     Status: Abnormal   Collection Time: 09/28/16  6:28 AM  Result Value Ref Range   Glucose-Capillary 151 (H)  65 - 99 mg/dL     HEENT: normal Cardio: RRR and no murmur Resp: CTA B/L and unlabored GI: BS positive and NT, ND Extremity:  No Edema Skin:   Intact Neuro: Alert/Oriented, Normal Sensory, Abnormal Motor 3- Left delt bi tri grip, HF, KE ADF and Abnormal FMC Ataxic/ dec FMC Musc/Skel:  Other no pain with UE or LE ROM Gen NAD   Assessment/Plan: 1. 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 Physiatrist is providing close team supervision and 24 hour management of active medical problems listed below. Physiatrist and rehab team continue to assess barriers to discharge/monitor patient progress toward functional and medical goals. FIM: Function - Bathing Position: Shower Body parts bathed by patient: Left arm, Chest, Abdomen, Front perineal area, Right upper leg, Left  upper leg Body parts bathed by helper: Right arm, Buttocks, Right lower leg, Left lower leg, Back Assist Level:  (Mod assist)  Function- Upper Body Dressing/Undressing What is the patient wearing?: Pull over shirt/dress Pull over shirt/dress - Perfomed by patient: Thread/unthread right sleeve, Thread/unthread left sleeve, Put head through opening, Pull shirt over trunk Pull over shirt/dress - Perfomed by helper: Thread/unthread left sleeve Assist Level: Supervision or verbal cues, Set up Set up : To obtain clothing/put away Function - Lower Body Dressing/Undressing What is the patient wearing?: Underwear, Pants, Non-skid slipper socks Position: Wheelchair/chair at sink Underwear - Performed by patient: Thread/unthread left underwear leg, Thread/unthread right underwear leg Underwear - Performed by helper: Pull underwear up/down Pants- Performed by patient: Thread/unthread left pants leg, Thread/unthread right pants leg Pants- Performed by helper: Pull pants up/down Non-skid slipper socks- Performed by patient: Don/doff left sock Non-skid slipper socks- Performed by helper: Don/doff right sock Assist for footwear: Partial/moderate assist Assist for lower body dressing:  (Mod assist)  Function - Toileting Toileting steps completed by patient: Performs perineal hygiene, Adjust clothing after toileting Toileting steps completed by helper: Adjust clothing prior to toileting Toileting Assistive Devices: Grab bar or rail Assist level: Touching or steadying assistance (Pt.75%)  Function - Air cabin crew transfer assistive device: Elevated toilet seat/BSC over toilet, Walker Assist level to toilet: Touching or steadying assistance (Pt > 75%) Assist level from toilet: Touching or steadying assistance (Pt > 75%) Assist level to bedside commode (at bedside): Moderate assist (Pt 50 - 74%/lift or lower) (per Lynda Rainwater, NT) Assist level from bedside commode (at bedside): Moderate  assist (Pt 50 - 74%/lift or lower)  Function - Chair/bed transfer Chair/bed transfer method: Ambulatory Chair/bed transfer assist level: Touching or steadying assistance (Pt > 75%) Chair/bed transfer assistive device: Armrests, Walker Chair/bed transfer details: Manual facilitation for weight shifting, Verbal cues for technique  Function - Locomotion: Wheelchair Will patient use wheelchair at discharge?: Yes Type: Manual Max wheelchair distance: 50 Assist Level: Moderate assistance (Pt 50 - 74%) Wheel 50 feet with 2 turns activity did not occur: Safety/medical concerns Assist Level: Moderate assistance (Pt 50 - 74%) Wheel 150 feet activity did not occur: Safety/medical concerns Turns around,maneuvers to table,bed, and toilet,negotiates 3% grade,maneuvers on rugs and over doorsills: No Function - Locomotion: Ambulation Assistive device: Walker-rolling Max distance: 12' Assist level: Touching or steadying assistance (Pt > 75%) Assist level: Touching or steadying assistance (Pt > 75%) Walk 50 feet with 2 turns activity did not occur: Safety/medical concerns Assist level: Touching or steadying assistance (Pt > 75%) Walk 150 feet activity did not occur: Safety/medical concerns Walk 10 feet  on uneven surfaces activity did not occur: Safety/medical concerns  Function - Comprehension Comprehension: Auditory Comprehension assist level: Follows basic conversation/direction with no assist  Function - Expression Expression: Verbal Expression assist level: Expresses complex ideas: With extra time/assistive device  Function - Social Interaction Social Interaction assist level: Interacts appropriately with others with medication or extra time (anti-anxiety, antidepressant).  Function - Problem Solving Problem solving assist level: Solves basic problems with no assist  Function - Memory Memory assist level: Recognizes or recalls 90% of the time/requires cueing < 10% of the time Patient  normally able to recall (first 3 days only): Current season, That he or she is in a hospital, Location of own room  Medical Problem List and Plan: 1. Left hemiparesissecondary to right PLICinfarct- plavix  Cont CIR PT, OT- Team conference today please see physician documentation under team conference tab, met with team face-to-face to discuss problems,progress, and goals. Formulized individual treatment plan based on medical history, underlying problem and comorbidities. 2. DVT Prophylaxis/Anticoagulation: Subcutaneous heparin. Monitor platelet counts and any signs of bleeding 3. Pain Management: Tylenol as needed 4. Mood: Provide emotional support 5. Neuropsych: This patient iscapable of making decisions on herown behalf. 6. Skin/Wound Care: Routine skin checks 7. Fluids/Electrolytes/Nutrition: Routine I&O with follow-up chemistries 8.Hypertension. Norvasc 10 mg daily, Cozaar 50 mg twice a day, Lopressor 50 mg daily, Imdur 60 mg daily. Monitor with increased mobility,BPs slowly normalizing Vitals:   09/27/16 1500 09/28/16 0600  BP: (!) 125/59 (!) 152/63  Pulse: 72 73  Resp: 18 18  Temp: 98 F (36.7 C) 98.2 F (36.8 C)  SpO2: 100% 100%   9.Diabetes mellitus with peripheral neuropathy. Hemoglobin A1c 7.7. SSI. Patient on Glucotrol 5 mg daily prior to admission. Resume 106m qam 8/28 CBG (last 3)   Recent Labs  09/27/16 1653 09/27/16 2145 09/28/16 0628  GLUCAP 176* 255* 151*    10.CAD with CABG. Continue Plavix. No chest pain or shortness of breath 11.CKD stage III. Creatinine 2.04 on admission. Follow-up chemistries 12.Hyperlipidemia. Lipitor  LOS (Days) 6 A FACE TO FACE EVALUATION WAS PERFORMED  KIRSTEINS,ANDREW E 09/28/2016, 8:19 AM

## 2016-09-28 NOTE — Progress Notes (Signed)
Occupational Therapy Session Note  Patient Details  Name: [name] MRN: [id number] Date of Birth: 11/22/1927  Today's Date: 09/28/2016  Session 1 OT Individual Time: 1000-1100 OT Individual Time Calculation (min): 60 min   Session 2 OT Individual Time: 1345-1430 OT Individual Time Calculation (min): 45 min    Short Term Goals: Week 1:  OT Short Term Goal 1 (Week 1): Pt will complete toilet transfers with min A  OT Short Term Goal 2 (Week 1): Pt will completed UB dressing while sitting using hemi dressing techniques  OT Short Term Goal 3 (Week 1): Pt will tolerate standing while brushing her teeth  OT Short Term Goal 4 (Week 1): Pt will complete LB dressing with min A  OT Short Term Goal 5 (Week 1): Pt will complete shower transfer with min A   Skilled Therapeutic Interventions/Progress Updates:  Session 1  Pt incontinent of bladder in bed upon OT arrival. She required min A to transfer to sitting, then ambulated to commode with min and RW. Pt needed assistance removing adult brief 2/2 being completely saturated through brief and gown. Pt voided successfully in toilet, then ambulated to shower w/ RW. Pt with improved L UE functional today and was able to bring wash cloth to R UE underarm. Incorporated forced use of L UE throughout bathing/dressing tasks. Pt needed min VC to recall hemi-dressing techniques, then able to thread B LE pant legs today. Worked on anterior weight shift with standing ADL tasks. Educated pt on friction reducing device to don TED hose with max A. Pt left seated in wc with needs met.  Session 2  OT treatment session focused on L NMR and fine motor coordination. Pt ambulated to bathroom and transferred onto toilet with min guard A. Pt completed 3/3 steps of toileting w/ min guard/supervision. Pt then brought to therapy gym. Pt completed graded buttoning and tying task with increased time and demonstration to problem motor plan tying with L hand. Pt then needed  hand-over hand to figure out how to sew smaller buttons. Pt then participated in graded peg board activity focused on in-hand manipulation, translation, and rotation of pegs. Pt returned to room at left seated in wc at end of session with needs met.   Therapy Documentation Precautions:  Precautions Precautions: Fall Precaution Comments: L hemi Restrictions Weight Bearing Restrictions: No Pain:   none/denies pain  See Function Navigator for Current Functional Status.   Therapy/Group: Individual Therapy   S  09/28/2016, 2:32 PM 

## 2016-09-28 NOTE — Patient Care Conference (Signed)
Inpatient RehabilitationTeam Conference and Plan of Care Update Date: 09/28/2016   Time: 11:00 AM    Patient Name: [name]      Medical Record Number: [id number]  Date of Birth: [birth year] Sex: Female         Room/Bed: 4M12C/4M12C-01 Payor Info: Payor: [employer] MEDICARE / Plan: [employer] MEDICARE / Product Type: *No Product type* /    Admitting Diagnosis: R CVA  Admit Date/Time:  09/22/2016  6:10 PM Admission Comments: No comment available   Primary Diagnosis:  Hemiparesis affecting left side as late effect of stroke ([place]) Principal Problem: Hemiparesis affecting left side as late effect of stroke [facility])  Patient Active Problem List   Diagnosis Date Noted  . Small vessel disease 09/22/2016  . Hemiparesis affecting left side as late effect of stroke ([place]) 09/22/2016  . Gait disturbance, post-stroke 09/22/2016  . Stroke (cerebrum) ([place]) 09/20/2016  . Diabetes mellitus with complication ([place])   . Ischemic stroke ([place])   . Status post intraocular lens implant 06/23/2011  . Glaucoma suspect 12/30/2010  . Macular hole 12/30/2010  . Nonproliferative diabetic retinopathy ([place]) 12/30/2010  . Syncope 06/17/2010  . CARDIOMYOPATHY 04/15/2010  . PLEURAL EFFUSION 04/14/2010  . SHORTNESS OF BREATH 04/14/2010  . CORONARY ATHEROSCLEROSIS NATIVE CORONARY ARTERY 01/19/2009  . Hyperlipidemia 01/06/2009  . Diabetes mellitus ([place]) 01/05/2009  . Essential hypertension 01/05/2009  . Coronary atherosclerosis 01/05/2009    Expected Discharge Date: Expected Discharge Date: 10/06/16  Team Members Present: Physician leading conference: Dr. Alysia Penna Social Worker Present: Ovidio Kin, LCSW Nurse Present: Leonette Nutting, RN PT Present: Dwyane Dee, PT OT Present: Cherylynn Ridges, OT SLP Present: Windell Moulding, SLP PPS Coordinator present : Daiva Nakayama, RN, CRRN     Current Status/Progress Goal Weekly Team Focus  Medical   Blood pressure normalizing, tolerating therapy program,  blood sugars still elevated  Discharge with normal tensive pressures as well as controlled blood sugars  Restart home diabetic medications, continue monitoring blood pressure, make adjustments as needed   Bowel/Bladder   continent of bowel & bladder, LBM 09/26/16  remain continent  continue to monitor & assist as needed   Swallow/Nutrition/ Hydration             ADL's   Min A overall  Mod I/supervision  L UE NMR, general strengthening, activity tolerance, pt/family ed, modified bathing/dressing   Mobility   Min guard transfers and gait up to 45' RW  Mod I transfers, supervision gait  LLE NMR and strengthening, posture w/ gait and standing, transfers   Communication             Safety/Cognition/ Behavioral Observations            Pain   no c/o pain, has tylenol prn, has not used it  pain scale <2  continue to assess & treat as needed   Skin   bruising to right abdomen & left lateral hip, antecubital spaces, & hands from lab & IVs  no new areas of skin break down  assess q shift      *See Care Plan and progress notes for long and short-term goals.     Barriers to Discharge  Current Status/Progress Possible Resolutions Date Resolved   Physician    Medical stability;Decreased caregiver support     Progressing towards goals  See above      Nursing                  PT  Inaccessible home environment;Decreased caregiver  support;Home environment access/layout;Lack of/limited family support                 OT                  SLP                SW                Discharge Planning/Teaching Needs:  Home with daughter and nephew who can provide almost 24 hr supervision level.       Team Discussion:  Goals supervision-mod/i level. Doing well in her therapies. Working on Hydrographic surveyor and activity tolerance. Check UA due to urgency was negative over the weekend when checked. Swelling in legs-RN to put ted hose on. MD adjusting BP and DM meds.   Revisions to Treatment Plan:  DC  9/6    Continued Need for Acute Rehabilitation Level of Care: The patient requires daily medical management by a physician with specialized training in physical medicine and rehabilitation for the following conditions: Daily direction of a multidisciplinary physical rehabilitation program to ensure safe treatment while eliciting the highest outcome that is of practical value to the patient.: Yes Daily medical management of patient stability for increased activity during participation in an intensive rehabilitation regime.: Yes Daily analysis of laboratory values and/or radiology reports with any subsequent need for medication adjustment of medical intervention for : Blood pressure problems;Diabetes problems;Neurological problems  [name] 09/28/2016, 12:49 PM

## 2016-09-28 NOTE — Progress Notes (Signed)
Physical Therapy Session Note  Patient Details  Name: [name] MRN: [id number] Date of Birth: October 15, 1927  Today's Date: 09/28/2016 PT Individual Time: 1100-1200 1600-1700 PT Individual Time Calculation (min): 60 min and 73mn   Short Term Goals: Week 1:  PT Short Term Goal 1 (Week 1): Pt will transfer bed<>chair w/ supervision PT Short Term Goal 2 (Week 1): Pt will ambulate 531 w/ Min A using LRAD PT Short Term Goal 3 (Week 1): Pt will maintain dynamic standing balance w/ Min A  PT Short Term Goal 4 (Week 1): Pt will self-propel w/c w/ Min A 50' PT Short Term Goal 5 (Week 1): Pt will perform all bed mobility w/ supervision  Skilled Therapeutic Interventions/Progress Updates:   Session 1  Pt received sitting in WC and agreeable to PT. PT applied DF ace wrap to LLE prior to mobility training.   Gait training with RW on carpeted surface and tile floor 644fand [id number] min assist from PT. Min cues for step length and Ad management in turns.   Transfer training instructed by PT with stand pivot technique and RW; Min assist from PT throughout treatment with pt requiring count down from PT to initiate.   Nustep level 3>4 x 10 minutes for NMR and endurance training moderate cues for improved ROM on the LLE and improved hip control .   Standing balacne training to stand on 3inch Wedge 1 minute and 1.5 minutes with BUE support max assist on first bout progressing to supervision assist on second bout.  Forward reaching with 1 UE x 2 each UE, min-mod assist from PT to prevent posterior LOB   Patient returned too room and [facility] reach and all needs met.    session 2   Pt received sitting in WC and agreeable to PT  WC mobility x [id number] min assist and moderate cues for improved use of LUE to maintain UE support.   BLE seated therex. With 4lb ankle weights.  LAQ,  reciprocal marches Hip abduction Isometric adduction Ankle PF Ankle DF AROM Min cues  for improved ROM, and improved Eccentric control.   Gait training instructed by PT with RW and LLE DF wrap 60ft38fft,2f 20ft w70fmin assist from PT. Min cues for improved pelvic stability and posture.   [facility] returned to room and [facility] and all needs met.          Therapy Documentation Precautions:  Precautions Precautions: Fall Precaution Comments: L hemi Restrictions Weight Bearing Restrictions: No Pain: 0/10   See Function Navigator for Current Functional Status.   Therapy/Group: Individual Therapy  Nakesha Ebrahim Lorie [id number], 3:22 PM

## 2016-09-28 NOTE — Progress Notes (Signed)
Social Work [name], female   DOB: 01/17/1928, 83 y.o.   MRN: [id number]  Met with pt to discuss team conferenee goals supervision-mod/i level. Target discharge of 9/6. She  Report she loves it here and can see her progress in therapies. Her daughter is flying in form [place] this weekend and her son will be there if needed. She feels she will have someone with her when  She goes home. Will work on discharge needs.

## 2016-09-29 ENCOUNTER — Inpatient Hospital Stay (HOSPITAL_COMMUNITY): Payer: Medicare Other | Admitting: Physical Therapy

## 2016-09-29 ENCOUNTER — Inpatient Hospital Stay (HOSPITAL_COMMUNITY): Payer: Medicare Other | Admitting: Occupational Therapy

## 2016-09-29 LAB — URINALYSIS, ROUTINE W REFLEX MICROSCOPIC
Bacteria, UA: NONE SEEN
Bilirubin Urine: NEGATIVE
GLUCOSE, UA: NEGATIVE mg/dL
Hgb urine dipstick: NEGATIVE
Ketones, ur: NEGATIVE mg/dL
Leukocytes, UA: NEGATIVE
Nitrite: NEGATIVE
PROTEIN: 30 mg/dL — AB
Specific Gravity, Urine: 1.006 (ref 1.005–1.030)
pH: 5 (ref 5.0–8.0)

## 2016-09-29 LAB — GLUCOSE, CAPILLARY
GLUCOSE-CAPILLARY: 152 mg/dL — AB (ref 65–99)
GLUCOSE-CAPILLARY: 197 mg/dL — AB (ref 65–99)
Glucose-Capillary: 136 mg/dL — ABNORMAL HIGH (ref 65–99)
Glucose-Capillary: 135 mg/dL — ABNORMAL HIGH (ref 65–99)

## 2016-09-29 MED ORDER — METOPROLOL TARTRATE 50 MG PO TABS
100.0000 mg | ORAL_TABLET | Freq: Every day | ORAL | Status: DC
  Administered 2016-09-29 – 2016-10-06 (×8): 100 mg via ORAL
  Filled 2016-09-29 (×9): qty 2

## 2016-09-29 NOTE — Progress Notes (Signed)
Occupational Therapy Session Note  Patient Details  Name: [name] MRN: [id number] Date of Birth: 1927/07/02  Today's Date: 09/29/2016  Session 1 OT Individual Time: 7048-8891 OT Individual Time Calculation (min): 57 min   Session 2 OT Individual Time: 1415-1445 OT Individual Time Calculation (min): 30 min   Short Term Goals: Week 1:  OT Short Term Goal 1 (Week 1): Pt will complete toilet transfers with min A  OT Short Term Goal 2 (Week 1): Pt will completed UB dressing while sitting using hemi dressing techniques  OT Short Term Goal 3 (Week 1): Pt will tolerate standing while brushing her teeth  OT Short Term Goal 4 (Week 1): Pt will complete LB dressing with min A  OT Short Term Goal 5 (Week 1): Pt will complete shower transfer with min A   Skilled Therapeutic Interventions/Progress Updates:  Session 1   OT treatment session focused on functional mobility, modified bathing/dressing, activity tolerance, and L NMR. Pt greeted seated on commode, unable to have a BM. Pt ambulated to tub seat in shower and able to doff underwear in standing and safely sit to take off of legs. Bathing completed with focus on forced use or L UE to complete most bathing tasks. Pt ambulated out of shower, then needed rest break prior to donning clothing with min A and questioning cues for recall of hemi-techniques.  Addressed standing balance within grooming tasks with tactile cues for anterior weight shift. Pt then ambulated 20 feet in hallway with verbal and tactile cues for L hip/knee extension when stepping. Also worked on controlling descent onto wc with 3 trials. Pt left seated in wc with lunch and needs met.   Session 2 OT treatment session focused on B UE coordination and L fine motor control. Hand-eye and B UE coordination with beach ball catch and toss. Focus on reaction with L hand as OT purposefully tossed ball closer to L side. Fine motor coordination using graded beading  Task with different sized  beads and shapes. Pt initially needed min demonstration to integrate L UE appropriately into task. Continued to work on B UE strengthening/coordination with wc propulsion back to room. Pt left seated in wc with needs met.   Therapy Documentation Precautions:  Precautions Precautions: Fall Precaution Comments: L hemi Restrictions Weight Bearing Restrictions: No Pain:    See Function Navigator for Current Functional Status.   Therapy/Group: Individual Therapy  Valma Cava 09/29/2016, 2:49 PM

## 2016-09-29 NOTE — Progress Notes (Signed)
Physical Therapy Session Note  Patient Details  Name: [name] MRN: [id number] Date of Birth: 01/05/1928  Today's Date: 09/29/2016 PT Individual Time: 1550-1620 PT Individual Time Calculation (min): 30 min   Short Term Goals: Week 1:  PT Short Term Goal 1 (Week 1): Pt will transfer bed<>chair w/ supervision PT Short Term Goal 2 (Week 1): Pt will ambulate 49' w/ Min A using LRAD PT Short Term Goal 3 (Week 1): Pt will maintain dynamic standing balance w/ Min A  PT Short Term Goal 4 (Week 1): Pt will self-propel w/c w/ Min A 50' PT Short Term Goal 5 (Week 1): Pt will perform all bed mobility w/ supervision  Skilled Therapeutic Interventions/Progress Updates:    no c/o pain.  Session focus on activity tolerance.  Pt reporting feeling increased fatigue this afternoon, very drowsy during session.  Pt completes nustep at level 3 with BLEs only for 10 minutes for reciprocal stepping pattern retraining, strengthening, and overall activity tolerance.  PT instructed pt in w/c propulsion, first with BUEs (max assist to maintain straight path), then with BUEs and RLE (mod assist), and finally with R hemi technique (mod assist.  Pt requires frequent rest breaks and max cues for technique with all modes of w/c propulsion.  Pt requesting to terminate session early 2/2 fatigue, left upright in w/c with call bell in reach and needs met.   Therapy Documentation Precautions:  Precautions Precautions: Fall Precaution Comments: L hemi Restrictions Weight Bearing Restrictions: No General: PT Amount of Missed Time (min): 15 Minutes PT Missed Treatment Reason: Patient fatigue   See Function Navigator for Current Functional Status.   Therapy/Group: Individual Therapy  [name] 09/29/2016, 4:25 PM

## 2016-09-29 NOTE — Progress Notes (Signed)
Physical Therapy Session Note  Patient Details  Name: [name] MRN: [id number] Date of Birth: 07-26-1927  Today's Date: 09/29/2016 PT Individual Time: 1300-1400 PT Individual Time Calculation (min): 60 min   Short Term Goals: Week 1:  PT Short Term Goal 1 (Week 1): Pt will transfer bed<>chair w/ supervision PT Short Term Goal 2 (Week 1): Pt will ambulate 31' w/ Min A using LRAD PT Short Term Goal 3 (Week 1): Pt will maintain dynamic standing balance w/ Min A  PT Short Term Goal 4 (Week 1): Pt will self-propel w/c w/ Min A 50' PT Short Term Goal 5 (Week 1): Pt will perform all bed mobility w/ supervision  Skilled Therapeutic Interventions/Progress Updates:    no c/o pain.  Session focus on gait training with RW and NMR for postural control and pelvic control for carryover to ambulation.    Gait training on level surfaces x75' with intermittent supervision<>min guard with verbal cues for upright posture and decreased crouched gait pattern.  High level gait through obstacle course focus on stepping over/around obstacles safely with min assist and mod verbal cues for sequencing and safety.    NMR in standing with R foot blocked on 1" step during LUE fine motor task with tactile cues at L knee/glute/hip abd for upright posture.  Pt able to complete task in 4 trials with rest breaks between due to fatigue.  Trunk/pelvic NMR in // bars forward/retro stepping against resistance band 2 trials of 3 sets each focus on controlled movement, activation of core musculature, and pelvic stability.    Pt returned to room at end of session positioned upright in w/c to await OT.  Son present in room.   Therapy Documentation Precautions:  Precautions Precautions: Fall Precaution Comments: L hemi Restrictions Weight Bearing Restrictions: No   See Function Navigator for Current Functional Status.   Therapy/Group: Individual Therapy  [name] 09/29/2016, 2:04 PM

## 2016-09-29 NOTE — Progress Notes (Signed)
Subjective/Complaints: Discussed renal fxn, still has burning with urination last UA - on 8/26, states it has foul odor, will recheck ROS: neg CP, SOB, N/V/D Objective: Vital Signs: Blood pressure (!) 174/70, pulse 83, temperature 97.6 F (36.4 C), resp. rate 18, height 5\' 3"  (1.6 m), weight 75.8 kg (167 lb), SpO2 100 %. No results found. Results for orders placed or performed during the hospital encounter of 09/22/16 (from the past 72 hour(s))  Glucose, capillary     Status: Abnormal   Collection Time: 09/26/16 12:00 PM  Result Value Ref Range   Glucose-Capillary 190 (H) 65 - 99 mg/dL  Glucose, capillary     Status: Abnormal   Collection Time: 09/26/16  4:27 PM  Result Value Ref Range   Glucose-Capillary 182 (H) 65 - 99 mg/dL  Glucose, capillary     Status: Abnormal   Collection Time: 09/26/16  8:44 PM  Result Value Ref Range   Glucose-Capillary 249 (H) 65 - 99 mg/dL  Glucose, capillary     Status: Abnormal   Collection Time: 09/27/16  7:01 AM  Result Value Ref Range   Glucose-Capillary 182 (H) 65 - 99 mg/dL  Glucose, capillary     Status: Abnormal   Collection Time: 09/27/16 11:41 AM  Result Value Ref Range   Glucose-Capillary 140 (H) 65 - 99 mg/dL  Glucose, capillary     Status: Abnormal   Collection Time: 09/27/16  4:53 PM  Result Value Ref Range   Glucose-Capillary 176 (H) 65 - 99 mg/dL  Glucose, capillary     Status: Abnormal   Collection Time: 09/27/16  9:45 PM  Result Value Ref Range   Glucose-Capillary 255 (H) 65 - 99 mg/dL  Glucose, capillary     Status: Abnormal   Collection Time: 09/28/16  6:28 AM  Result Value Ref Range   Glucose-Capillary 151 (H) 65 - 99 mg/dL  Glucose, capillary     Status: Abnormal   Collection Time: 09/28/16 12:06 PM  Result Value Ref Range   Glucose-Capillary 125 (H) 65 - 99 mg/dL  Glucose, capillary     Status: Abnormal   Collection Time: 09/28/16  4:55 PM  Result Value Ref Range   Glucose-Capillary 173 (H) 65 - 99 mg/dL  Glucose,  capillary     Status: Abnormal   Collection Time: 09/28/16  8:43 PM  Result Value Ref Range   Glucose-Capillary 204 (H) 65 - 99 mg/dL  Glucose, capillary     Status: Abnormal   Collection Time: 09/29/16  6:44 AM  Result Value Ref Range   Glucose-Capillary 152 (H) 65 - 99 mg/dL   Comment 1 Notify RN      HEENT: normal Cardio: RRR and no murmur Resp: CTA B/L and unlabored GI: BS positive and NT, ND Extremity:  No Edema Skin:   Intact Neuro: Alert/Oriented, Normal Sensory, Abnormal Motor 3- Left delt bi tri grip, HF, KE ADF and Abnormal FMC Ataxic/ dec FMC Musc/Skel:  Other no pain with UE or LE ROM Gen NAD   Assessment/Plan: 1. 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 Physiatrist is providing close team supervision and 24 hour management of active medical problems listed below. Physiatrist and rehab team continue to assess barriers to discharge/monitor patient progress toward functional and medical goals. FIM: Function - Bathing Position: Shower Body parts bathed by patient: Left arm, Chest, Abdomen, Front perineal area, Right upper leg, Left upper leg Body  parts bathed by helper: Right arm, Buttocks, Right lower leg, Left lower leg, Back Assist Level:  (Mod assist)  Function- Upper Body Dressing/Undressing What is the patient wearing?: Pull over shirt/dress Pull over shirt/dress - Perfomed by patient: Thread/unthread right sleeve, Thread/unthread left sleeve, Put head through opening, Pull shirt over trunk Pull over shirt/dress - Perfomed by helper: Thread/unthread left sleeve Assist Level: Supervision or verbal cues, Set up Set up : To obtain clothing/put away Function - Lower Body Dressing/Undressing What is the patient wearing?: Underwear, Pants, Non-skid slipper socks Position: Wheelchair/chair at sink Underwear - Performed by patient: Thread/unthread left  underwear leg, Thread/unthread right underwear leg Underwear - Performed by helper: Pull underwear up/down Pants- Performed by patient: Thread/unthread left pants leg, Thread/unthread right pants leg Pants- Performed by helper: Pull pants up/down Non-skid slipper socks- Performed by patient: Don/doff left sock Non-skid slipper socks- Performed by helper: Don/doff right sock Assist for footwear: Partial/moderate assist Assist for lower body dressing:  (Mod assist)  Function - Toileting Toileting steps completed by patient: Performs perineal hygiene, Adjust clothing after toileting, Adjust clothing prior to toileting Toileting steps completed by helper: Adjust clothing prior to toileting Toileting Assistive Devices: Grab bar or rail Assist level: Touching or steadying assistance (Pt.75%)  Function - Air cabin crew transfer assistive device: Elevated toilet seat/BSC over toilet, Walker Assist level to toilet: Supervision or verbal cues Assist level from toilet: Supervision or verbal cues Assist level to bedside commode (at bedside): Moderate assist (Pt 50 - 74%/lift or lower) (per Lynda Rainwater, NT) Assist level from bedside commode (at bedside): Moderate assist (Pt 50 - 74%/lift or lower)  Function - Chair/bed transfer Chair/bed transfer method: Ambulatory Chair/bed transfer assist level: Touching or steadying assistance (Pt > 75%) Chair/bed transfer assistive device: Armrests, Walker Chair/bed transfer details: Manual facilitation for weight shifting, Verbal cues for technique  Function - Locomotion: Wheelchair Will patient use wheelchair at discharge?: Yes Type: Manual Max wheelchair distance: 50 Assist Level: Moderate assistance (Pt 50 - 74%) Wheel 50 feet with 2 turns activity did not occur: Safety/medical concerns Assist Level: Moderate assistance (Pt 50 - 74%) Wheel 150 feet activity did not occur: Safety/medical concerns Turns around,maneuvers to table,bed, and  toilet,negotiates 3% grade,maneuvers on rugs and over doorsills: No Function - Locomotion: Ambulation Assistive device: Walker-rolling Max distance: 72' Assist level: Touching or steadying assistance (Pt > 75%) Assist level: Touching or steadying assistance (Pt > 75%) Walk 50 feet with 2 turns activity did not occur: Safety/medical concerns Assist level: Touching or steadying assistance (Pt > 75%) Walk 150 feet activity did not occur: Safety/medical concerns Walk 10 feet on uneven surfaces activity did not occur: Safety/medical concerns  Function - Comprehension Comprehension: Auditory Comprehension assist level: Follows basic conversation/direction with no assist  Function - Expression Expression: Verbal Expression assist level: Expresses complex ideas: With extra time/assistive device  Function - Social Interaction Social Interaction assist level: Interacts appropriately with others with medication or extra time (anti-anxiety, antidepressant).  Function - Problem Solving Problem solving assist level: Solves basic problems with no assist  Function - Memory Memory assist level: Recognizes or recalls 90% of the time/requires cueing < 10% of the time Patient normally able to recall (first 3 days only): Current season, That he or she is in a hospital, Location of own room  Medical Problem List and Plan: 1. Left hemiparesissecondary to right PLICinfarct- plavix  Cont CIR PT, OT-2. DVT Prophylaxis/Anticoagulation: Subcutaneous heparin. Monitor platelet counts and any signs of bleeding 3. Pain Management: Tylenol  as needed 4. Mood: Provide emotional support 5. Neuropsych: This patient iscapable of making decisions on herown behalf. 6. Skin/Wound Care: Routine skin checks 7. Fluids/Electrolytes/Nutrition: Routine I&O with follow-up chemistries 8.Hypertension. Norvasc 10 mg daily, Cozaar 50 mg twice a day, Lopressor 50 mg daily, Imdur 60 mg daily. Monitor with increased  mobility,BPs with elevated systolic, d/c cozaar due to Creat elevation , will increase lopressor, monitor BPs and HR Vitals:   09/28/16 1430 09/29/16 0600  BP: (!) 160/61 (!) 174/70  Pulse: 70 83  Resp: 18   Temp: 98.4 F (36.9 C) 97.6 F (36.4 C)  SpO2: 100% 100%   9.Diabetes mellitus with peripheral neuropathy. Hemoglobin A1c 7.7. SSI. Patient on Glucotrol 5 mg daily prior to admission. Resume 5mg  qam 8/28 CBG (last 3)   Recent Labs  09/28/16 1655 09/28/16 2043 09/29/16 0644  GLUCAP 173* 204* 152*    10.CAD with CABG. Continue Plavix. No chest pain or shortness of breath 11.CKD stage III. Creatinine 2.04 on admission. Will d/c cozaar will need f/u BMET 12.Hyperlipidemia. Lipitor  LOS (Days) 7 A FACE TO FACE EVALUATION WAS PERFORMED  KIRSTEINS,ANDREW E 09/29/2016, 7:38 AM

## 2016-09-29 NOTE — Progress Notes (Signed)
Occupational Therapy Weekly Progress Note  Patient Details  Name: [name] MRN: [id number] Date of Birth: 1927-10-26  Beginning of progress report period: September 23, 2016 End of progress report period: September 29, 2016  Patient has met 5 of 5 short term goals.  Pt is making steady progress with OT treatments at this time.  She is able to complete all transfers with use of the RW and min guard assist.  LUE function continues to improve to a supervision level for bathing and dressing tasks.  She continues to need increased time for fastening belt and pants as well as for tying.  Supervision for sit to to stand with min instructional cueing for hand placement.  Feel she is on target to reach  goals for discharge home next week.  Will continue with current OT treatment POC.    Patient continues to demonstrate the following deficits: muscle weakness, impaired timing and sequencing, unbalanced muscle activation, ataxia and decreased coordination, decreased safety awareness, decreased memory and delayed processing and decreased sitting balance, decreased standing balance, hemiplegia and decreased balance strategies and therefore will continue to benefit from skilled OT intervention to enhance overall performance with BADL.  Patient progressing toward long term goals..  Continue plan of care.  OT Short Term Goals Week 1:  OT Short Term Goal 1 (Week 1): Pt will complete toilet transfers with min A  OT Short Term Goal 1 - Progress (Week 1): Met OT Short Term Goal 2 (Week 1): Pt will completed UB dressing while sitting using hemi dressing techniques  OT Short Term Goal 2 - Progress (Week 1): Met OT Short Term Goal 3 (Week 1): Pt will tolerate standing while brushing her teeth  OT Short Term Goal 3 - Progress (Week 1): Met OT Short Term Goal 4 (Week 1): Pt will complete LB dressing with min A  OT Short Term Goal 4 - Progress (Week 1): Met OT Short Term Goal 5 (Week 1): Pt will complete shower transfer  with min A  OT Short Term Goal 5 - Progress (Week 1): Met Week 2:  OT Short Term Goal 1 (Week 2): LTG=STG 2/2 ELOS  Therapy Documentation Precautions:  Precautions Precautions: Fall Precaution Comments: L hemi Restrictions Weight Bearing Restrictions: No  See Function Navigator for Current Functional Status.   Therapy/Group: Individual Therapy  Valma Cava 09/29/2016, 3:37 PM

## 2016-09-30 ENCOUNTER — Inpatient Hospital Stay (HOSPITAL_COMMUNITY): Payer: Medicare Other | Admitting: Physical Therapy

## 2016-09-30 ENCOUNTER — Inpatient Hospital Stay (HOSPITAL_COMMUNITY): Payer: Medicare Other | Admitting: Occupational Therapy

## 2016-09-30 DIAGNOSIS — N183 Chronic kidney disease, stage 3 unspecified: Secondary | ICD-10-CM

## 2016-09-30 DIAGNOSIS — N179 Acute kidney failure, unspecified: Secondary | ICD-10-CM

## 2016-09-30 DIAGNOSIS — D62 Acute posthemorrhagic anemia: Secondary | ICD-10-CM

## 2016-09-30 LAB — URINE CULTURE

## 2016-09-30 LAB — GLUCOSE, CAPILLARY
GLUCOSE-CAPILLARY: 178 mg/dL — AB (ref 65–99)
Glucose-Capillary: 133 mg/dL — ABNORMAL HIGH (ref 65–99)
Glucose-Capillary: 143 mg/dL — ABNORMAL HIGH (ref 65–99)
Glucose-Capillary: 220 mg/dL — ABNORMAL HIGH (ref 65–99)

## 2016-09-30 NOTE — Progress Notes (Signed)
Physical Therapy Weekly Progress Note  Patient Details  Name: [name] MRN: [id number] Date of Birth: [birth year]  Beginning of progress report period: September 23, 2016 End of progress report period: September 30, 2016  Today's Date: 09/30/2016 PT Individual Time: 1300-1411 PT Individual Time Calculation (min): 71 min   Patient has met 4 of 5 short term goals.  Pt has made excellent progress towards therapy goals this week and is currently performing all transfers and gait with min assist or better.  Pt continues to have difficulty with w/c propulsion due to LUE weakness/inattention and, as pt is currently ambulating greater than household distances with RW, d/c'd w/c goal.    Patient continues to demonstrate the following deficits muscle weakness, abnormal tone, unbalanced muscle activation and decreased coordination, decreased midline orientation and decreased attention to left, decreased attention, decreased awareness, decreased problem solving and decreased safety awareness and decreased standing balance, decreased postural control, hemiplegia and decreased balance strategies and therefore will continue to benefit from skilled PT intervention to increase functional independence with mobility.  Patient progressing toward long term goals..  Continue plan of care.  Discontinued home w/c propulsion goal.    PT Short Term Goals Week 1:  PT Short Term Goal 1 (Week 1): Pt will transfer bed<>chair w/ supervision PT Short Term Goal 1 - Progress (Week 1): Met PT Short Term Goal 2 (Week 1): Pt will ambulate 47' w/ Min A using LRAD PT Short Term Goal 2 - Progress (Week 1): Met PT Short Term Goal 3 (Week 1): Pt will maintain dynamic standing balance w/ Min A  PT Short Term Goal 3 - Progress (Week 1): Met PT Short Term Goal 4 (Week 1): Pt will self-propel w/c w/ Min A 50' PT Short Term Goal 4 - Progress (Week 1): Not met PT Short Term Goal 5 (Week 1): Pt will perform all bed mobility w/  supervision PT Short Term Goal 5 - Progress (Week 1): Partly met Week 2:  PT Short Term Goal 1 (Week 2): =LTGs due to ELOS  Skilled Therapeutic Interventions/Progress Updates:    no c/o pain.  Session focus on balance with and without UE support, and NMR.    PT administered BERG Balance Scale and patient demonstrates high fall risk as noted by score of 22/56 on Berg Balance Scale. PT explained results for patient and set goals to improve scores with static standing positions.    NMR and activity tolerance in standing with table top activities, min guard>supervision for standing with mod fade to min tactile cues for LLE activation and posture.  Pt completed peg board activity with LUE/RUE with mod verbal cues needed to correctly match pattern in standing, min verbal cues to correctly match same pattern in seated.  Standing folding towels focus on bimanual activity, activity tolerance, and LLE activation.  Gait training x50' with RW and supervision, min verbal cues for upright posture and L glute med activation during stance phase.    Pt returned to room at end of session and positioned upright in w/c with call bell in reach and needs met.   Therapy Documentation Precautions:  Precautions Precautions: Fall Precaution Comments: L hemi Restrictions Weight Bearing Restrictions: No   See Function Navigator for Current Functional Status.  Therapy/Group: Individual Therapy  [name] 09/30/2016, 1:24 PM

## 2016-09-30 NOTE — Progress Notes (Signed)
Occupational Therapy Session Note  Patient Details  Name: [name] MRN: [id number] Date of Birth: 02-03-1927  Today's Date: 09/30/2016 OT Individual Time: 1045-1200 OT Individual Time Calculation (min): 75 min    Short Term Goals: Week 2:  OT Short Term Goal 1 (Week 2): LTG=STG 2/2 ELOS  Skilled Therapeutic Interventions/Progress Updates:      Pt seen for BADL retraining of toileting, bathing, and dressing with a focus on functional mobility, standing balance and use of LUE/LLE.  Pt wanted to use bed rails to get out of bed. Instructed in how to get up from a regular flat bed without rails with guiding A.  She was cued to push up with B hands and then reach for walker and was able to follow through the rest of the session.  Pt ambulated to toilet and then to shower with steadying A with RW.  She only needed S with toileting, shower and dressing. A to don TEDS and new tighter socks.    Pt transitioned to arm chair for LUE NMR focusing on her shoulder. Good scapular movement, but sh flex only to 30-40 degrees. Focused on upright posture to avoid rounded shoulders as she worked on A/AROM of sh flexion with the UE Ranger and then with B hands on unweighted dowel bar.  Pt participated well and then felt muscular fatigue. She transferred to w/c. Pt resting in chair with all needs met.   Therapy Documentation Precautions:  Precautions Precautions: Fall Precaution Comments: L hemi Restrictions Weight Bearing Restrictions: No  Pain: Pain Assessment Pain Assessment: No/denies pain See Function Navigator for Current Functional Status.   Therapy/Group: Individual Therapy  [place] 09/30/2016, 12:17 PM

## 2016-09-30 NOTE — Plan of Care (Signed)
Problem: RH Wheelchair Mobility Goal: LTG Patient will propel w/c in home environment (PT) LTG: Patient will propel wheelchair in home environment, # of feet with assistance (PT).  Outcome: Not Applicable Date Met: 09/30/16 D/C as pt will not use w/c in the home.   

## 2016-09-30 NOTE — Progress Notes (Signed)
Subjective/Complaints: Pt seen laying in bed this AM.  She slept well overnight.  She is slowed.   ROS: Denies CP, SOB, N/V/D  Objective: Vital Signs: Blood pressure (!) 150/78, pulse 62, temperature 98.1 F (36.7 C), temperature source Oral, resp. rate 18, height 5\' 3"  (1.6 m), weight 75.8 kg (167 lb 1.7 oz), SpO2 98 %. No results found. Results for orders placed or performed during the hospital encounter of 09/22/16 (from the past 72 hour(s))  Glucose, capillary     Status: Abnormal   Collection Time: 09/27/16 11:41 AM  Result Value Ref Range   Glucose-Capillary 140 (H) 65 - 99 mg/dL  Glucose, capillary     Status: Abnormal   Collection Time: 09/27/16  4:53 PM  Result Value Ref Range   Glucose-Capillary 176 (H) 65 - 99 mg/dL  Glucose, capillary     Status: Abnormal   Collection Time: 09/27/16  9:45 PM  Result Value Ref Range   Glucose-Capillary 255 (H) 65 - 99 mg/dL  Glucose, capillary     Status: Abnormal   Collection Time: 09/28/16  6:28 AM  Result Value Ref Range   Glucose-Capillary 151 (H) 65 - 99 mg/dL  Glucose, capillary     Status: Abnormal   Collection Time: 09/28/16 12:06 PM  Result Value Ref Range   Glucose-Capillary 125 (H) 65 - 99 mg/dL  Glucose, capillary     Status: Abnormal   Collection Time: 09/28/16  4:55 PM  Result Value Ref Range   Glucose-Capillary 173 (H) 65 - 99 mg/dL  Glucose, capillary     Status: Abnormal   Collection Time: 09/28/16  8:43 PM  Result Value Ref Range   Glucose-Capillary 204 (H) 65 - 99 mg/dL  Glucose, capillary     Status: Abnormal   Collection Time: 09/29/16  6:44 AM  Result Value Ref Range   Glucose-Capillary 152 (H) 65 - 99 mg/dL   Comment 1 Notify RN   Urinalysis, Routine w reflex microscopic     Status: Abnormal   Collection Time: 09/29/16 11:05 AM  Result Value Ref Range   Color, Urine STRAW (A) YELLOW   APPearance CLEAR CLEAR   Specific Gravity, Urine 1.006 1.005 - 1.030   pH 5.0 5.0 - 8.0   Glucose, UA NEGATIVE  NEGATIVE mg/dL   Hgb urine dipstick NEGATIVE NEGATIVE   Bilirubin Urine NEGATIVE NEGATIVE   Ketones, ur NEGATIVE NEGATIVE mg/dL   Protein, ur 30 (A) NEGATIVE mg/dL   Nitrite NEGATIVE NEGATIVE   Leukocytes, UA NEGATIVE NEGATIVE   RBC / HPF 0-5 0 - 5 RBC/hpf   WBC, UA 0-5 0 - 5 WBC/hpf   Bacteria, UA NONE SEEN NONE SEEN   Squamous Epithelial / LPF 0-5 (A) NONE SEEN  Glucose, capillary     Status: Abnormal   Collection Time: 09/29/16 11:48 AM  Result Value Ref Range   Glucose-Capillary 136 (H) 65 - 99 mg/dL  Glucose, capillary     Status: Abnormal   Collection Time: 09/29/16  4:35 PM  Result Value Ref Range   Glucose-Capillary 135 (H) 65 - 99 mg/dL  Glucose, capillary     Status: Abnormal   Collection Time: 09/29/16  8:58 PM  Result Value Ref Range   Glucose-Capillary 197 (H) 65 - 99 mg/dL  Glucose, capillary     Status: Abnormal   Collection Time: 09/30/16  6:25 AM  Result Value Ref Range   Glucose-Capillary 133 (H) 65 - 99 mg/dL     HEENT: normocephalic Cardio: RRR  and no JVD Resp: CTA B/L and unlabored GI: BS positive and ND Skin:   Intact. Warm and dry Neuro: Alert/Oriented x3 Motor 3-/5 Left delt bi tri grip, HF, KE ADF  Musc/Skel:  No edema. No tenderness. Gen NAD. Vital signs reviewed Psych: Slowed   Assessment/Plan: 1. 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 Physiatrist is providing close team supervision and 24 hour management of active medical problems listed below. Physiatrist and rehab team continue to assess barriers to discharge/monitor patient progress toward functional and medical goals. FIM: Function - Bathing Position: Shower Body parts bathed by patient: Right arm, Left arm, Chest, Abdomen, Front perineal area, Buttocks, Right upper leg, Left upper leg, Right lower leg, Left lower leg Body parts bathed by helper: Right arm, Buttocks,  Right lower leg, Left lower leg, Back Assist Level: Touching or steadying assistance(Pt > 75%)  Function- Upper Body Dressing/Undressing What is the patient wearing?: Pull over shirt/dress Pull over shirt/dress - Perfomed by patient: Thread/unthread right sleeve, Thread/unthread left sleeve, Put head through opening, Pull shirt over trunk Pull over shirt/dress - Perfomed by helper: Thread/unthread left sleeve Assist Level: Supervision or verbal cues Set up : To obtain clothing/put away Function - Lower Body Dressing/Undressing What is the patient wearing?: Pants, Underwear, Non-skid slipper socks, Ted Hose Position: Wheelchair/chair at [employer] - Performed by patient: Thread/unthread right underwear leg, Thread/unthread left underwear leg, Pull underwear up/down Underwear - Performed by helper: Pull underwear up/down Pants- Performed by patient: Thread/unthread right pants leg, Thread/unthread left pants leg, Pull pants up/down Pants- Performed by helper: Pull pants up/down Non-skid slipper socks- Performed by patient: Don/doff right sock, Don/doff left sock Non-skid slipper socks- Performed by helper: Don/doff right sock TED Hose - Performed by helper: Don/doff right TED hose, Don/doff left TED hose Assist for footwear: Supervision/touching assist Assist for lower body dressing: Touching or steadying assistance (Pt > 75%)  Function - Toileting Toileting steps completed by patient: Adjust clothing prior to toileting, Performs perineal hygiene, Adjust clothing after toileting Toileting steps completed by helper: Adjust clothing prior to toileting Toileting Assistive Devices: Grab bar or rail Assist level: Supervision or verbal cues  Function - Air cabin crew transfer assistive device: Elevated toilet seat/BSC over toilet, Walker Assist level to toilet: Supervision or verbal cues Assist level from toilet: Supervision or verbal cues Assist level to bedside commode (at  bedside): Moderate assist (Pt 50 - 74%/lift or lower) (per Lynda Rainwater, NT) Assist level from bedside commode (at bedside): Moderate assist (Pt 50 - 74%/lift or lower)  Function - Chair/bed transfer Chair/bed transfer method: Ambulatory Chair/bed transfer assist level: Touching or steadying assistance (Pt > 75%) Chair/bed transfer assistive device: Armrests, Walker Chair/bed transfer details: Manual facilitation for weight shifting, Verbal cues for technique  Function - Locomotion: Wheelchair Will patient use wheelchair at discharge?: Yes Type: Manual Max wheelchair distance: 50 Assist Level: Moderate assistance (Pt 50 - 74%) Wheel 50 feet with 2 turns activity did not occur: Safety/medical concerns Assist Level: Moderate assistance (Pt 50 - 74%) Wheel 150 feet activity did not occur: Safety/medical concerns Turns around,maneuvers to table,bed, and toilet,negotiates 3% grade,maneuvers on rugs and over doorsills: No Function - Locomotion: Ambulation Assistive device: Walker-rolling Max distance: 75 Assist level: Touching or steadying assistance (Pt > 75%) Assist level: Touching or steadying assistance (Pt > 75%) Walk 50 feet with 2 turns activity did not occur: Safety/medical concerns Assist level: Touching or  steadying assistance (Pt > 75%) Walk 150 feet activity did not occur: Safety/medical concerns Walk 10 feet on uneven surfaces activity did not occur: Safety/medical concerns  Function - Comprehension Comprehension: Auditory Comprehension assist level: Follows basic conversation/direction with no assist  Function - Expression Expression: Verbal Expression assist level: Expresses complex ideas: With extra time/assistive device  Function - Social Interaction Social Interaction assist level: Interacts appropriately with others with medication or extra time (anti-anxiety, antidepressant).  Function - Problem Solving Problem solving assist level: Solves basic problems with no  assist  Function - Memory Memory assist level: Recognizes or recalls 90% of the time/requires cueing < 10% of the time Patient normally able to recall (first 3 days only): Current season, That he or she is in a hospital, Location of own room  Medical Problem List and Plan: 1. Left hemiparesissecondary to right PLICinfarct- plavix   Cont CIR   Notes reviewed, images reviewed 2. DVT Prophylaxis/Anticoagulation: Subcutaneous heparin. Monitor platelet counts and any signs of bleeding 3. Pain Management: Tylenol as needed 4. Mood: Provide emotional support 5. Neuropsych: This patient iscapable of making decisions on herown behalf. 6. Skin/Wound Care: Routine skin checks 7. Fluids/Electrolytes/Nutrition: Routine I&O with follow-up chemistries 8.Hypertension. Norvasc 10 mg daily, Cozaar 50 mg twice a day, Lopressor 50 mg daily, Imdur 60 mg daily. Monitor with increased mobility,  D/ced cozaar due to Creat elevation   Increased lopressor Vitals:   09/29/16 1304 09/30/16 0521  BP: (!) 144/55 (!) 150/78  Pulse: (!) 59 62  Resp: 18 18  Temp: 97.9 F (36.6 C) 98.1 F (36.7 C)  SpO2: 100% 98%   Improving 8/31 9.Diabetes mellitus with peripheral neuropathy. Hemoglobin A1c 7.7. SSI. Patient on Glucotrol 5 mg daily prior to admission.   Resumed 5mg  qam 8/28 CBG (last 3)   Recent Labs  09/29/16 1635 09/29/16 2058 09/30/16 0625  GLUCAP 135* 197* 133*   Overall improving 8/31 10.CAD with CABG. Continue Plavix. No chest pain or shortness of breath 11.CKD stage III with AKI  Creatinine 2.04 on admission.   D/ced cozaar   Labs ordered for Monday 12.Hyperlipidemia. Lipitor 13. ABLA  Hb 10.9 on 8/24  Labs ordered for Monday  Cont to monitor  LOS (Days) 8 A FACE TO FACE EVALUATION WAS PERFORMED  Gwendalyn Mcgonagle Lorie Phenix 09/30/2016, 9:11 AM

## 2016-09-30 NOTE — Progress Notes (Signed)
Occupational Therapy Session Note  Patient Details  Name: Coreen Shippee MRN: [id number] Date of Birth: 05/29/1927  Today's Date: 09/30/2016 OT Individual Time: 1451-1540 OT Individual Time Calculation (min): 49 min    Short Term Goals: Week 2:  OT Short Term Goal 1 (Week 2): LTG=STG 2/2 ELOS  Skilled Therapeutic Interventions/Progress Updates:    Tx focus on standing balance and Lt NMR during meaningful IADL/leisure engagement.   Pt greeted in w/c. No c/o pain. Pt ambulated with RW and Min A into bathroom to void bladder, and then ambulated to sink to wash her hands in standing. Pt relying on elbow support on sink to maintain balance. Afterwards she was escorted to dayroom. Listened to Renato Battles to enhance affect. Pt washed tables while standing, actively weight shifting to Lt side when bending outside of base of support. Pt using Lt hand to wash/dry, while side stepping around tables. Longest standing time without rest 3 minutes. Had her engage in [place] dancing activity in supported chair. Used visual biofeedback with use of mirror, modeling and manual cuing for improving coordination of Lt side. NMR emphasis on reciprocal bilateral UE movements. Pt smiling and laughing with OT during this time, reported feeling encouraged by her visible progress. "I didn't think I could do this." Afterwards pt was escorted back to room and left with all needs within reach.   Therapy Documentation Precautions:  Precautions Precautions: Fall Precaution Comments: L hemi Restrictions Weight Bearing Restrictions: No Vital Signs: Therapy Vitals Temp: 98.1 F (36.7 C) Temp Source: Oral Pulse Rate: 62 Resp: 18 BP: (!) 150/78 Patient Position (if appropriate): Lying Oxygen Therapy SpO2: 98 % O2 Device: Not Delivered Pain: No c/o pain during tx    ADL: ADL Eating: Set up Where Assessed-Eating: Bed level Grooming: Minimal assistance Where Assessed-Grooming: Sitting at sink Upper Body Bathing:  Minimal assistance Where Assessed-Upper Body Bathing: Shower Lower Body Bathing: Maximal assistance Where Assessed-Lower Body Bathing: Shower Upper Body Dressing: Maximal cueing, Minimal assistance Where Assessed-Upper Body Dressing: Sitting at sink Lower Body Dressing: Maximal cueing, Moderate assistance Where Assessed-Lower Body Dressing: Sitting at sink Toileting: Moderate assistance, Moderate cueing Where Assessed-Toileting: Bedside Commode Toilet Transfer: Moderate assistance Toilet Transfer Method: Stand pivot Toilet Transfer Equipment: Bedside commode Tub/Shower Transfer: Unable to assess Tub/Shower Transfer Method: Unable to assess Social research officer, government: Moderate assistance, Moderate cueing Social research officer, government Method: Radiographer, therapeutic: Grab bars, Shower seat with back ADL Comments: Pt demonstrates fear of falling and needs reminders to use L side      See Function Navigator for Current Functional Status.   Therapy/Group: Individual Therapy  Mignonne Afonso A Dallys Nowakowski 09/30/2016, 7:38 AM

## 2016-10-01 ENCOUNTER — Inpatient Hospital Stay (HOSPITAL_COMMUNITY): Payer: Medicare Other | Admitting: Physical Therapy

## 2016-10-01 LAB — GLUCOSE, CAPILLARY
GLUCOSE-CAPILLARY: 161 mg/dL — AB (ref 65–99)
GLUCOSE-CAPILLARY: 204 mg/dL — AB (ref 65–99)
GLUCOSE-CAPILLARY: 182 mg/dL — AB (ref 65–99)
Glucose-Capillary: 155 mg/dL — ABNORMAL HIGH (ref 65–99)

## 2016-10-01 MED ORDER — GLIPIZIDE 5 MG PO TABS
7.5000 mg | ORAL_TABLET | Freq: Every day | ORAL | Status: DC
  Administered 2016-10-02 – 2016-10-04 (×3): 7.5 mg via ORAL
  Filled 2016-10-01 (×3): qty 1

## 2016-10-01 MED ORDER — GLIPIZIDE 2.5 MG HALF TABLET
2.5000 mg | ORAL_TABLET | Freq: Once | ORAL | Status: AC
  Administered 2016-10-01: 2.5 mg via ORAL
  Filled 2016-10-01: qty 1

## 2016-10-01 NOTE — Progress Notes (Signed)
Subjective/Complaints: Seen sitting up at the edge of bed this morning. Family at bedside. She notes she slept well overnight. She is more alert this morning.  ROS: Denies CP, SOB, N/V/D  Objective: Vital Signs: Blood pressure (!) 165/69, pulse 73, temperature 98.6 F (37 C), temperature source Oral, resp. rate 18, height 5\' 3"  (1.6 m), weight 75.3 kg (166 lb), SpO2 100 %. No results found. Results for orders placed or performed during the hospital encounter of 09/22/16 (from the past 72 hour(s))  Glucose, capillary     Status: Abnormal   Collection Time: 09/28/16 12:06 PM  Result Value Ref Range   Glucose-Capillary 125 (H) 65 - 99 mg/dL  Glucose, capillary     Status: Abnormal   Collection Time: 09/28/16  4:55 PM  Result Value Ref Range   Glucose-Capillary 173 (H) 65 - 99 mg/dL  Glucose, capillary     Status: Abnormal   Collection Time: 09/28/16  8:43 PM  Result Value Ref Range   Glucose-Capillary 204 (H) 65 - 99 mg/dL  Glucose, capillary     Status: Abnormal   Collection Time: 09/29/16  6:44 AM  Result Value Ref Range   Glucose-Capillary 152 (H) 65 - 99 mg/dL   Comment 1 Notify RN   Urinalysis, Routine w reflex microscopic     Status: Abnormal   Collection Time: 09/29/16 11:05 AM  Result Value Ref Range   Color, Urine STRAW (A) YELLOW   APPearance CLEAR CLEAR   Specific Gravity, Urine 1.006 1.005 - 1.030   pH 5.0 5.0 - 8.0   Glucose, UA NEGATIVE NEGATIVE mg/dL   Hgb urine dipstick NEGATIVE NEGATIVE   Bilirubin Urine NEGATIVE NEGATIVE   Ketones, ur NEGATIVE NEGATIVE mg/dL   Protein, ur 30 (A) NEGATIVE mg/dL   Nitrite NEGATIVE NEGATIVE   Leukocytes, UA NEGATIVE NEGATIVE   RBC / HPF 0-5 0 - 5 RBC/hpf   WBC, UA 0-5 0 - 5 WBC/hpf   Bacteria, UA NONE SEEN NONE SEEN   Squamous Epithelial / LPF 0-5 (A) NONE SEEN  Urine Culture     Status: Abnormal   Collection Time: 09/29/16 11:05 AM  Result Value Ref Range   Specimen Description URINE, RANDOM    Special Requests NONE     Culture MULTIPLE SPECIES PRESENT, SUGGEST RECOLLECTION (A)    Report Status 09/30/2016 FINAL   Glucose, capillary     Status: Abnormal   Collection Time: 09/29/16 11:48 AM  Result Value Ref Range   Glucose-Capillary 136 (H) 65 - 99 mg/dL  Glucose, capillary     Status: Abnormal   Collection Time: 09/29/16  4:35 PM  Result Value Ref Range   Glucose-Capillary 135 (H) 65 - 99 mg/dL  Glucose, capillary     Status: Abnormal   Collection Time: 09/29/16  8:58 PM  Result Value Ref Range   Glucose-Capillary 197 (H) 65 - 99 mg/dL  Glucose, capillary     Status: Abnormal   Collection Time: 09/30/16  6:25 AM  Result Value Ref Range   Glucose-Capillary 133 (H) 65 - 99 mg/dL  Glucose, capillary     Status: Abnormal   Collection Time: 09/30/16 11:27 AM  Result Value Ref Range   Glucose-Capillary 143 (H) 65 - 99 mg/dL  Glucose, capillary     Status: Abnormal   Collection Time: 09/30/16  4:39 PM  Result Value Ref Range   Glucose-Capillary 178 (H) 65 - 99 mg/dL  Glucose, capillary     Status: Abnormal   Collection Time:  09/30/16  9:34 PM  Result Value Ref Range   Glucose-Capillary 220 (H) 65 - 99 mg/dL   Comment 1 Notify RN   Glucose, capillary     Status: Abnormal   Collection Time: 10/01/16  6:46 AM  Result Value Ref Range   Glucose-Capillary 161 (H) 65 - 99 mg/dL   Comment 1 Notify RN      HEENT: normocephalic, Atraumatic Cardio: RRR and no JVD Resp: CTA B/L and unlabored GI: BS positive and ND Skin:   Intact. Warm and dry Neuro: Alert/Oriented Motor 4-/5 Left delt bi tri grip LLE: 4/5 HF, KE ADF  RUE/RLE: 4+/5 proximal to distal Musc/Skel:  No edema. No tenderness. Gen NAD. Vital signs reviewed Psych: Normal mood and behavior   Assessment/Plan: 1. 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 Physiatrist is providing close team supervision and 24 hour management of  active medical problems listed below. Physiatrist and rehab team continue to assess barriers to discharge/monitor patient progress toward functional and medical goals. FIM: Function - Bathing Position: Shower Body parts bathed by patient: Right arm, Left arm, Chest, Abdomen, Front perineal area, Buttocks, Right upper leg, Left upper leg, Right lower leg, Left lower leg Body parts bathed by helper: Back Assist Level: Supervision or verbal cues  Function- Upper Body Dressing/Undressing What is the patient wearing?: Pull over shirt/dress Pull over shirt/dress - Perfomed by patient: Thread/unthread right sleeve, Thread/unthread left sleeve, Put head through opening, Pull shirt over trunk Pull over shirt/dress - Perfomed by helper: Thread/unthread left sleeve Assist Level: Set up Set up : To obtain clothing/put away Function - Lower Body Dressing/Undressing What is the patient wearing?: Pants, Underwear, Non-skid slipper socks, Ted Hose Position: Sitting EOB Underwear - Performed by patient: Thread/unthread right underwear leg, Thread/unthread left underwear leg, Pull underwear up/down Underwear - Performed by helper: Pull underwear up/down Pants- Performed by patient: Thread/unthread right pants leg, Thread/unthread left pants leg, Pull pants up/down Pants- Performed by helper: Pull pants up/down Non-skid slipper socks- Performed by patient: Don/doff right sock, Don/doff left sock Non-skid slipper socks- Performed by helper: Don/doff right sock, Don/doff left sock TED Hose - Performed by helper: Don/doff right TED hose, Don/doff left TED hose Assist for footwear: Partial/moderate assist Assist for lower body dressing: Supervision or verbal cues  Function - Toileting Toileting steps completed by patient: Adjust clothing prior to toileting, Performs perineal hygiene, Adjust clothing after toileting Toileting steps completed by helper: Adjust clothing prior to toileting Toileting Assistive  Devices: Grab bar or rail Assist level: Supervision or verbal cues  Function - Air cabin crew transfer assistive device: Walker, Grab bar Assist level to toilet: Touching or steadying assistance (Pt > 75%) Assist level from toilet: Touching or steadying assistance (Pt > 75%) Assist level to bedside commode (at bedside): Moderate assist (Pt 50 - 74%/lift or lower) (per Lynda Rainwater, NT) Assist level from bedside commode (at bedside): Moderate assist (Pt 50 - 74%/lift or lower)  Function - Chair/bed transfer Chair/bed transfer method: Ambulatory Chair/bed transfer assist level: Touching or steadying assistance (Pt > 75%) Chair/bed transfer assistive device: Armrests, Walker Chair/bed transfer details: Manual facilitation for weight shifting, Verbal cues for technique  Function - Locomotion: Wheelchair Will patient use wheelchair at discharge?: Yes Type: Manual Max wheelchair distance: 50 Assist Level: Moderate assistance (Pt 50 - 74%) Wheel 50 feet with 2 turns activity did not occur: Safety/medical concerns Assist Level: Moderate assistance (Pt  50 - 74%) Wheel 150 feet activity did not occur: Safety/medical concerns Turns around,maneuvers to table,bed, and toilet,negotiates 3% grade,maneuvers on rugs and over doorsills: No Function - Locomotion: Ambulation Assistive device: Walker-rolling Max distance: 75 Assist level: Touching or steadying assistance (Pt > 75%) Assist level: Touching or steadying assistance (Pt > 75%) Walk 50 feet with 2 turns activity did not occur: Safety/medical concerns Assist level: Touching or steadying assistance (Pt > 75%) Walk 150 feet activity did not occur: Safety/medical concerns Walk 10 feet on uneven surfaces activity did not occur: Safety/medical concerns  Function - Comprehension Comprehension: Auditory Comprehension assist level: Follows complex conversation/direction with no assist  Function - Expression Expression:  Verbal Expression assist level: Expresses complex ideas: With extra time/assistive device  Function - Social Interaction Social Interaction assist level: Interacts appropriately with others with medication or extra time (anti-anxiety, antidepressant).  Function - Problem Solving Problem solving assist level: Solves basic problems with no assist  Function - Memory Memory assist level: Recognizes or recalls 90% of the time/requires cueing < 10% of the time Patient normally able to recall (first 3 days only): Current season, That he or she is in a hospital, Location of own room  Medical Problem List and Plan: 1. Left hemiparesissecondary to right PLICinfarct- plavix   Cont CIR  2. DVT Prophylaxis/Anticoagulation: Subcutaneous heparin. Monitor platelet counts and any signs of bleeding 3. Pain Management: Tylenol as needed 4. Mood: Provide emotional support 5. Neuropsych: This patient iscapable of making decisions on herown behalf. 6. Skin/Wound Care: Routine skin checks 7. Fluids/Electrolytes/Nutrition: Routine I&O with follow-up chemistries 8.Hypertension. Norvasc 10 mg daily, Imdur 60 mg daily. Monitor with increased mobility,  D/ced cozaar 50 due to Creat elevation   Lopressor 50 mg increased 100 on 8/30 Vitals:   09/30/16 1500 10/01/16 0658  BP: 137/80 (!) 165/69  Pulse: 65 73  Resp: 18 18  Temp: 98.1 F (36.7 C) 98.6 F (37 C)  SpO2: 100% 100%   Labile and 9/1 9.Diabetes mellitus with peripheral neuropathy. Hemoglobin A1c 7.7. SSI.   Glucotrol increased to 7. 5mg  qam 9/1 CBG (last 3)   Recent Labs  09/30/16 1639 09/30/16 2134 10/01/16 0646  GLUCAP 178* 220* 161*  10.CAD with CABG. Continue Plavix. No chest pain or shortness of breath 11.CKD stage III with AKI  Creatinine 2.04 on admission.   D/ced cozaar   Labs ordered for Monday 12.Hyperlipidemia. Lipitor 13. ABLA  Hb 10.9 on 8/24  Labs ordered for Monday  Cont to monitor  LOS (Days) 9 A FACE TO  FACE EVALUATION WAS PERFORMED  Lorrin Bodner Lorie Phenix 10/01/2016, 8:55 AM

## 2016-10-01 NOTE — Progress Notes (Signed)
Physical Therapy Session Note  Patient Details  Name: [name] MRN: [id number] Date of Birth: 05/24/1927  Today's Date: 10/01/2016 PT Individual Time: 0930-1012 PT Individual Time Calculation (min): 42 min   Short Term Goals: Week 2:  PT Short Term Goal 1 (Week 2): =LTGs due to ELOS  Skilled Therapeutic Interventions/Progress Updates:    no c/o pain.  Session focus on dynamic sitting and standing balance and transfers during bathing at shower level.  Pt requires supervision for transfers throughout session with verbal cues for LE placement.  Ambulation within room with supervision and verbal cues for upright posture and LLE activation.  Pt completes bathing at shower level and dressing from BSC with supervision overall for dynamic sitting and standing balance and for transfers with grab bar/RW.  Pt returned to w/c at end of session and positioned with call bell in reach and needs met.   Therapy Documentation Precautions:  Precautions Precautions: Fall Precaution Comments: L hemi Restrictions Weight Bearing Restrictions: No   See Function Navigator for Current Functional Status.   Therapy/Group: Individual Therapy   E  10/01/2016, 10:15 AM  

## 2016-10-02 ENCOUNTER — Inpatient Hospital Stay (HOSPITAL_COMMUNITY): Payer: Medicare Other | Admitting: Occupational Therapy

## 2016-10-02 LAB — GLUCOSE, CAPILLARY
GLUCOSE-CAPILLARY: 138 mg/dL — AB (ref 65–99)
GLUCOSE-CAPILLARY: 151 mg/dL — AB (ref 65–99)
Glucose-Capillary: 130 mg/dL — ABNORMAL HIGH (ref 65–99)
Glucose-Capillary: 188 mg/dL — ABNORMAL HIGH (ref 65–99)

## 2016-10-02 NOTE — Progress Notes (Signed)
Subjective/Complaints: Patient seen lying in bed this morning. She states she slept well overnight. She denies complaints this morning.  ROS: Denies CP, SOB, N/V/D  Objective: Vital Signs: Blood pressure (!) 161/54, pulse 79, temperature 98.3 F (36.8 C), temperature source Oral, resp. rate 18, height 5\' 3"  (1.6 m), weight 75.3 kg (166 lb), SpO2 100 %. No results found. Results for orders placed or performed during the hospital encounter of 09/22/16 (from the past 72 hour(s))  Urinalysis, Routine w reflex microscopic     Status: Abnormal   Collection Time: 09/29/16 11:05 AM  Result Value Ref Range   Color, Urine STRAW (A) YELLOW   APPearance CLEAR CLEAR   Specific Gravity, Urine 1.006 1.005 - 1.030   pH 5.0 5.0 - 8.0   Glucose, UA NEGATIVE NEGATIVE mg/dL   Hgb urine dipstick NEGATIVE NEGATIVE   Bilirubin Urine NEGATIVE NEGATIVE   Ketones, ur NEGATIVE NEGATIVE mg/dL   Protein, ur 30 (A) NEGATIVE mg/dL   Nitrite NEGATIVE NEGATIVE   Leukocytes, UA NEGATIVE NEGATIVE   RBC / HPF 0-5 0 - 5 RBC/hpf   WBC, UA 0-5 0 - 5 WBC/hpf   Bacteria, UA NONE SEEN NONE SEEN   Squamous Epithelial / LPF 0-5 (A) NONE SEEN  Urine Culture     Status: Abnormal   Collection Time: 09/29/16 11:05 AM  Result Value Ref Range   Specimen Description URINE, RANDOM    Special Requests NONE    Culture MULTIPLE SPECIES PRESENT, SUGGEST RECOLLECTION (A)    Report Status 09/30/2016 FINAL   Glucose, capillary     Status: Abnormal   Collection Time: 09/29/16 11:48 AM  Result Value Ref Range   Glucose-Capillary 136 (H) 65 - 99 mg/dL  Glucose, capillary     Status: Abnormal   Collection Time: 09/29/16  4:35 PM  Result Value Ref Range   Glucose-Capillary 135 (H) 65 - 99 mg/dL  Glucose, capillary     Status: Abnormal   Collection Time: 09/29/16  8:58 PM  Result Value Ref Range   Glucose-Capillary 197 (H) 65 - 99 mg/dL  Glucose, capillary     Status: Abnormal   Collection Time: 09/30/16  6:25 AM  Result Value Ref  Range   Glucose-Capillary 133 (H) 65 - 99 mg/dL  Glucose, capillary     Status: Abnormal   Collection Time: 09/30/16 11:27 AM  Result Value Ref Range   Glucose-Capillary 143 (H) 65 - 99 mg/dL  Glucose, capillary     Status: Abnormal   Collection Time: 09/30/16  4:39 PM  Result Value Ref Range   Glucose-Capillary 178 (H) 65 - 99 mg/dL  Glucose, capillary     Status: Abnormal   Collection Time: 09/30/16  9:34 PM  Result Value Ref Range   Glucose-Capillary 220 (H) 65 - 99 mg/dL   Comment 1 Notify RN   Glucose, capillary     Status: Abnormal   Collection Time: 10/01/16  6:46 AM  Result Value Ref Range   Glucose-Capillary 161 (H) 65 - 99 mg/dL   Comment 1 Notify RN   Glucose, capillary     Status: Abnormal   Collection Time: 10/01/16 11:26 AM  Result Value Ref Range   Glucose-Capillary 204 (H) 65 - 99 mg/dL  Glucose, capillary     Status: Abnormal   Collection Time: 10/01/16  4:23 PM  Result Value Ref Range   Glucose-Capillary 182 (H) 65 - 99 mg/dL  Glucose, capillary     Status: Abnormal   Collection Time:  10/01/16  9:03 PM  Result Value Ref Range   Glucose-Capillary 155 (H) 65 - 99 mg/dL  Glucose, capillary     Status: Abnormal   Collection Time: 10/02/16  6:57 AM  Result Value Ref Range   Glucose-Capillary 138 (H) 65 - 99 mg/dL     HEENT: normocephalic, Atraumatic Cardio: RRR and no JVD Resp: CTA B/L and unlabored GI: BS positive and ND Skin:   Intact. Warm and dry Neuro: Alert/Oriented Motor 4-/5 Left delt bi tri grip (stable) LLE: 4/5 HF, KE ADF  RUE/RLE: 4+/5 proximal to distal Musc/Skel:  No edema. No tenderness. Gen NAD. Vital signs reviewed Psych: Normal mood and behavior   Assessment/Plan: 1. 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 Physiatrist is providing close team supervision and 24 hour management of active medical problems listed  below. Physiatrist and rehab team continue to assess barriers to discharge/monitor patient progress toward functional and medical goals. FIM: Function - Bathing Position: Shower Body parts bathed by patient: Right arm, Left arm, Chest, Abdomen, Front perineal area, Buttocks, Right upper leg, Left upper leg, Right lower leg, Left lower leg Body parts bathed by helper: Back Assist Level: Supervision or verbal cues  Function- Upper Body Dressing/Undressing What is the patient wearing?: Pull over shirt/dress Pull over shirt/dress - Perfomed by patient: Thread/unthread right sleeve, Thread/unthread left sleeve, Put head through opening, Pull shirt over trunk Pull over shirt/dress - Perfomed by helper: Thread/unthread left sleeve Assist Level: Set up Set up : To obtain clothing/put away Function - Lower Body Dressing/Undressing What is the patient wearing?: Pants, Underwear, Non-skid slipper socks Position: Sitting EOB Underwear - Performed by patient: Thread/unthread right underwear leg, Pull underwear up/down Underwear - Performed by helper: Thread/unthread left underwear leg Pants- Performed by patient: Thread/unthread right pants leg, Thread/unthread left pants leg, Pull pants up/down Pants- Performed by helper: Pull pants up/down Non-skid slipper socks- Performed by patient: Don/doff left sock Non-skid slipper socks- Performed by helper: Don/doff right sock TED Hose - Performed by helper: Don/doff right TED hose, Don/doff left TED hose Assist for footwear: Partial/moderate assist Assist for lower body dressing: Supervision or verbal cues  Function - Toileting Toileting steps completed by patient: Adjust clothing prior to toileting, Performs perineal hygiene, Adjust clothing after toileting Toileting steps completed by helper: Adjust clothing prior to toileting Toileting Assistive Devices: Grab bar or rail Assist level: Supervision or verbal cues  Function - Air cabin crew  transfer assistive device: Walker, Grab bar Assist level to toilet: Touching or steadying assistance (Pt > 75%) Assist level from toilet: Touching or steadying assistance (Pt > 75%) Assist level to bedside commode (at bedside): Moderate assist (Pt 50 - 74%/lift or lower) (per Lynda Rainwater, NT) Assist level from bedside commode (at bedside): Moderate assist (Pt 50 - 74%/lift or lower)  Function - Chair/bed transfer Chair/bed transfer method: Ambulatory Chair/bed transfer assist level: Supervision or verbal cues Chair/bed transfer assistive device: Armrests, Walker Chair/bed transfer details: Manual facilitation for weight shifting, Verbal cues for technique  Function - Locomotion: Wheelchair Will patient use wheelchair at discharge?: Yes Type: Manual Max wheelchair distance: 50 Assist Level: Moderate assistance (Pt 50 - 74%) Wheel 50 feet with 2 turns activity did not occur: Safety/medical concerns Assist Level: Moderate assistance (Pt 50 - 74%) Wheel 150 feet activity did not occur: Safety/medical concerns Turns around,maneuvers to table,bed, and toilet,negotiates 3% grade,maneuvers on rugs and over doorsills: No Function -  Locomotion: Ambulation Assistive device: Walker-rolling Max distance: 75 Assist level: Touching or steadying assistance (Pt > 75%) Assist level: Touching or steadying assistance (Pt > 75%) Walk 50 feet with 2 turns activity did not occur: Safety/medical concerns Assist level: Touching or steadying assistance (Pt > 75%) Walk 150 feet activity did not occur: Safety/medical concerns Walk 10 feet on uneven surfaces activity did not occur: Safety/medical concerns  Function - Comprehension Comprehension: Auditory Comprehension assist level: Follows complex conversation/direction with no assist  Function - Expression Expression: Verbal Expression assist level: Expresses complex ideas: With no assist  Function - Social Interaction Social Interaction assist level:  Interacts appropriately with others - No medications needed.  Function - Problem Solving Problem solving assist level: Solves basic problems with no assist  Function - Memory Memory assist level: Recognizes or recalls 90% of the time/requires cueing < 10% of the time Patient normally able to recall (first 3 days only): Current season, That he or she is in a hospital, Location of own room  Medical Problem List and Plan: 1. Left hemiparesissecondary to right PLICinfarct- plavix   Cont CIR  2. DVT Prophylaxis/Anticoagulation: Subcutaneous heparin. Monitor platelet counts and any signs of bleeding 3. Pain Management: Tylenol as needed 4. Mood: Provide emotional support 5. Neuropsych: This patient iscapable of making decisions on herown behalf. 6. Skin/Wound Care: Routine skin checks 7. Fluids/Electrolytes/Nutrition: Routine I&O with follow-up chemistries 8.Hypertension. Norvasc 10 mg daily, Imdur 60 mg daily. Monitor with increased mobility,  D/ced cozaar 50 due to Creat elevation   Lopressor 50 mg increased 100 on 8/30 Vitals:   10/01/16 1539 10/02/16 0523  BP: (!) 128/48 (!) 161/54  Pulse: 71 79  Resp: 18 18  Temp: 98 F (36.7 C) 98.3 F (36.8 C)  SpO2: 100% 100%   Labile, but improving on 9/2 9.Diabetes mellitus with peripheral neuropathy. Hemoglobin A1c 7.7. SSI.   Glucotrol increased to 7. 5mg  qam 9/1 CBG (last 3)   Recent Labs  10/01/16 1623 10/01/16 2103 10/02/16 0657  GLUCAP 182* 155* 138*   Improving 10.CAD with CABG. Continue Plavix. No chest pain or shortness of breath 11.CKD stage III with AKI  Creatinine 2.04 on admission.   D/ced cozaar   Labs ordered for tomorrow 12.Hyperlipidemia. Lipitor 13. ABLA  Hb 10.9 on 8/24  Labs ordered for tomorrow  Cont to monitor  LOS (Days) 10 A FACE TO FACE EVALUATION WAS PERFORMED  Ankit Lorie Phenix 10/02/2016, 7:24 AM

## 2016-10-02 NOTE — Progress Notes (Signed)
Occupational Therapy Session Note  Patient Details  Name: [name] MRN: [id number] Date of Birth: September 07, 1927  Today's Date: 10/02/2016 OT Individual Time: 1683-7290 OT Individual Time Calculation (min): 50 min    Skilled Therapeutic Interventions/Progress Updates:    Tx focus on Lt NMR, balance, and functional ambulation with device during functional tasks.   Pt greeted in w/c. Already bathed/dressed. Ready for session. She ambulated with RW to toilet to void bladder with supervision, and then to sink to wash hands in standing. Rest break required before she ambulated to therapy apartment. Pt standing at sink to wash/dry silverware, manipulating spoons/forks with Lt hand as needed with extra time. Cues for using Lt hand at dominant level during task for NMR. Also cues for upright posture during task as pt tended to lean with elbows on countertop for support. Pt required multiple seated rest breaks, able to stand for 5 minute windows. At end of tx pt was escorted back to room for time mgt. She was left in w/c with all needs within reach at time of departure.   Therapeutic use of music utilized for enhancing volition [name]).  Therapy Documentation Precautions:  Precautions Precautions: Fall Precaution Comments: L hemi Restrictions Weight Bearing Restrictions: No Vital Signs: Therapy Vitals Temp: 97.7 F (36.5 C) Temp Source: Oral Pulse Rate: 95 Resp: 18 BP: (!) 127/57 Patient Position (if appropriate): Sitting Oxygen Therapy SpO2: 100 % O2 Device: Not Delivered Pain: No c/o pain during tx    ADL: ADL Eating: Set up Where Assessed-Eating: Bed level Grooming: Minimal assistance Where Assessed-Grooming: Sitting at sink Upper Body Bathing: Minimal assistance Where Assessed-Upper Body Bathing: Shower Lower Body Bathing: Maximal assistance Where Assessed-Lower Body Bathing: Shower Upper Body Dressing: Maximal cueing, Minimal assistance Where Assessed-Upper Body  Dressing: Sitting at sink Lower Body Dressing: Maximal cueing, Moderate assistance Where Assessed-Lower Body Dressing: Sitting at sink Toileting: Moderate assistance, Moderate cueing Where Assessed-Toileting: Bedside Commode Toilet Transfer: Moderate assistance Toilet Transfer Method: Stand pivot Toilet Transfer Equipment: Bedside commode Tub/Shower Transfer: Unable to assess Tub/Shower Transfer Method: Unable to assess Social research officer, government: Moderate assistance, Moderate cueing Social research officer, government Method: Radiographer, therapeutic: Grab bars, Shower seat with back ADL Comments: Pt demonstrates fear of falling and needs reminders to use L side      See Function Navigator for Current Functional Status.   Therapy/Group: Individual Therapy  Angeline Trick A Sullivan Jacuinde 10/02/2016, 3:44 PM

## 2016-10-03 ENCOUNTER — Inpatient Hospital Stay (HOSPITAL_COMMUNITY): Payer: Medicare Other | Admitting: Physical Therapy

## 2016-10-03 ENCOUNTER — Inpatient Hospital Stay (HOSPITAL_COMMUNITY): Payer: Medicare Other | Admitting: Occupational Therapy

## 2016-10-03 DIAGNOSIS — R0989 Other specified symptoms and signs involving the circulatory and respiratory systems: Secondary | ICD-10-CM

## 2016-10-03 LAB — CBC WITH DIFFERENTIAL/PLATELET
BASOS PCT: 1 %
Basophils Absolute: 0 10*3/uL (ref 0.0–0.1)
EOS ABS: 0.2 10*3/uL (ref 0.0–0.7)
Eosinophils Relative: 3 %
HCT: 31.3 % — ABNORMAL LOW (ref 36.0–46.0)
HEMOGLOBIN: 10 g/dL — AB (ref 12.0–15.0)
Lymphocytes Relative: 30 %
Lymphs Abs: 1.6 10*3/uL (ref 0.7–4.0)
MCH: 28.5 pg (ref 26.0–34.0)
MCHC: 31.9 g/dL (ref 30.0–36.0)
MCV: 89.2 fL (ref 78.0–100.0)
Monocytes Absolute: 0.4 10*3/uL (ref 0.1–1.0)
Monocytes Relative: 8 %
NEUTROS PCT: 58 %
Neutro Abs: 3.2 10*3/uL (ref 1.7–7.7)
Platelets: 256 10*3/uL (ref 150–400)
RBC: 3.51 MIL/uL — ABNORMAL LOW (ref 3.87–5.11)
RDW: 14.2 % (ref 11.5–15.5)
WBC: 5.4 10*3/uL (ref 4.0–10.5)

## 2016-10-03 LAB — BASIC METABOLIC PANEL
Anion gap: 8 (ref 5–15)
BUN: 59 mg/dL — ABNORMAL HIGH (ref 6–20)
CHLORIDE: 106 mmol/L (ref 101–111)
CO2: 21 mmol/L — AB (ref 22–32)
Calcium: 10 mg/dL (ref 8.9–10.3)
Creatinine, Ser: 2.34 mg/dL — ABNORMAL HIGH (ref 0.44–1.00)
GFR calc non Af Amer: 18 mL/min — ABNORMAL LOW (ref >60)
GFR, EST AFRICAN AMERICAN: 20 mL/min — AB (ref >60)
Glucose, Bld: 168 mg/dL — ABNORMAL HIGH (ref 65–99)
POTASSIUM: 4.5 mmol/L (ref 3.5–5.1)
SODIUM: 135 mmol/L (ref 135–145)

## 2016-10-03 LAB — GLUCOSE, CAPILLARY
GLUCOSE-CAPILLARY: 118 mg/dL — AB (ref 65–99)
GLUCOSE-CAPILLARY: 157 mg/dL — AB (ref 65–99)
GLUCOSE-CAPILLARY: 174 mg/dL — AB (ref 65–99)
Glucose-Capillary: 186 mg/dL — ABNORMAL HIGH (ref 65–99)

## 2016-10-03 NOTE — Progress Notes (Signed)
Physical Therapy Session Note  Patient Details  Name: [name] MRN: [id number] Date of Birth: 04/08/1927  Today's Date: 10/03/2016 PT Individual Time: 0900-1000 PT Individual Time Calculation (min): 60 min   Short Term Goals: Week 2:  PT Short Term Goal 1 (Week 2): =LTGs due to ELOS  Skilled Therapeutic Interventions/Progress Updates:    no c/o pain.  Session focus on dynamic sitting/standing balance and functional mobility.    Pt completes toileting with supervision overall, ambulates with RW and supervision to and from bathroom.  Dynamic sitting and standing balance as well as transfers for bathing and dressing from w/c at sink, min verbal cues for full posture in standing.    Stair negotiation 2x4 steps with 2 rails for strengthening and activity tolerance, min assist on first trial and supervision on second trial.  Gait 2x40' with RW and supervision, pt able to maintain upright posture throughout without cues.    Pt returned to room at end of session and positioned in w/c with call bell in reach and needs met.   Therapy Documentation Precautions:  Precautions Precautions: Fall Precaution Comments: L hemi Restrictions Weight Bearing Restrictions: No   See Function Navigator for Current Functional Status.   Therapy/Group: Individual Therapy  [name] 10/03/2016, 9:48 AM

## 2016-10-03 NOTE — Progress Notes (Signed)
Subjective/Complaints: Pt seen sitting up in bed this AM, eating breakfast. She slept well overnight.  Family at bedside.   ROS: Denies CP, SOB, N/V/D  Objective: Vital Signs: Blood pressure (!) 156/72, pulse 77, temperature 98.8 F (37.1 C), temperature source Oral, resp. rate 16, height 5\' 3"  (1.6 m), weight 75.3 kg (166 lb), SpO2 99 %. No results found. Results for orders placed or performed during the hospital encounter of 09/22/16 (from the past 72 hour(s))  Glucose, capillary     Status: Abnormal   Collection Time: 09/30/16 11:27 AM  Result Value Ref Range   Glucose-Capillary 143 (H) 65 - 99 mg/dL  Glucose, capillary     Status: Abnormal   Collection Time: 09/30/16  4:39 PM  Result Value Ref Range   Glucose-Capillary 178 (H) 65 - 99 mg/dL  Glucose, capillary     Status: Abnormal   Collection Time: 09/30/16  9:34 PM  Result Value Ref Range   Glucose-Capillary 220 (H) 65 - 99 mg/dL   Comment 1 Notify RN   Glucose, capillary     Status: Abnormal   Collection Time: 10/01/16  6:46 AM  Result Value Ref Range   Glucose-Capillary 161 (H) 65 - 99 mg/dL   Comment 1 Notify RN   Glucose, capillary     Status: Abnormal   Collection Time: 10/01/16 11:26 AM  Result Value Ref Range   Glucose-Capillary 204 (H) 65 - 99 mg/dL  Glucose, capillary     Status: Abnormal   Collection Time: 10/01/16  4:23 PM  Result Value Ref Range   Glucose-Capillary 182 (H) 65 - 99 mg/dL  Glucose, capillary     Status: Abnormal   Collection Time: 10/01/16  9:03 PM  Result Value Ref Range   Glucose-Capillary 155 (H) 65 - 99 mg/dL  Glucose, capillary     Status: Abnormal   Collection Time: 10/02/16  6:57 AM  Result Value Ref Range   Glucose-Capillary 138 (H) 65 - 99 mg/dL  Glucose, capillary     Status: Abnormal   Collection Time: 10/02/16 11:53 AM  Result Value Ref Range   Glucose-Capillary 130 (H) 65 - 99 mg/dL  Glucose, capillary     Status: Abnormal   Collection Time: 10/02/16  4:43 PM  Result  Value Ref Range   Glucose-Capillary 188 (H) 65 - 99 mg/dL  Glucose, capillary     Status: Abnormal   Collection Time: 10/02/16  8:52 PM  Result Value Ref Range   Glucose-Capillary 151 (H) 65 - 99 mg/dL  Glucose, capillary     Status: Abnormal   Collection Time: 10/03/16  7:00 AM  Result Value Ref Range   Glucose-Capillary 118 (H) 65 - 99 mg/dL     HEENT: normocephalic, Atraumatic Cardio: RRR and no JVD Resp: CTA B/L and unlabored GI: BS positive and ND Skin:   Intact. Warm and dry Neuro: Alert/Oriented Motor 4-/5 Left delt bi tri grip (unchanged) LLE: 4/5 HF, KE ADF (stable) RUE/RLE: 4+/5 proximal to distal Musc/Skel:  No edema. No tenderness. Gen NAD. Vital signs reviewed Psych: Normal mood and behavior   Assessment/Plan: 1. 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 Physiatrist is providing close team supervision and 24 hour management of active medical problems listed below. Physiatrist and rehab team continue to assess barriers to discharge/monitor patient progress toward functional and medical goals. FIM: Function - Bathing Position: Shower Body parts bathed by patient:  Right arm, Left arm, Chest, Abdomen, Front perineal area, Buttocks, Right upper leg, Left upper leg, Right lower leg, Left lower leg Body parts bathed by helper: Back Assist Level: Supervision or verbal cues  Function- Upper Body Dressing/Undressing What is the patient wearing?: Pull over shirt/dress Pull over shirt/dress - Perfomed by patient: Thread/unthread right sleeve, Thread/unthread left sleeve, Put head through opening, Pull shirt over trunk Pull over shirt/dress - Perfomed by helper: Thread/unthread left sleeve Assist Level: Set up Set up : To obtain clothing/put away Function - Lower Body Dressing/Undressing What is the patient wearing?: Pants, Underwear, Non-skid slipper  socks Position: Sitting EOB Underwear - Performed by patient: Thread/unthread right underwear leg, Pull underwear up/down Underwear - Performed by helper: Thread/unthread left underwear leg Pants- Performed by patient: Thread/unthread right pants leg, Thread/unthread left pants leg, Pull pants up/down Pants- Performed by helper: Pull pants up/down Non-skid slipper socks- Performed by patient: Don/doff left sock Non-skid slipper socks- Performed by helper: Don/doff right sock TED Hose - Performed by helper: Don/doff right TED hose, Don/doff left TED hose Assist for footwear: Partial/moderate assist Assist for lower body dressing: Supervision or verbal cues  Function - Toileting Toileting steps completed by patient: Adjust clothing prior to toileting, Performs perineal hygiene, Adjust clothing after toileting Toileting steps completed by helper: Adjust clothing prior to toileting Toileting Assistive Devices: Grab bar or rail Assist level: Supervision or verbal cues  Function - Air cabin crew transfer assistive device: Walker, Grab bar Assist level to toilet: Supervision or verbal cues Assist level from toilet: Supervision or verbal cues Assist level to bedside commode (at bedside): Moderate assist (Pt 50 - 74%/lift or lower) (per Lynda Rainwater, NT) Assist level from bedside commode (at bedside): Moderate assist (Pt 50 - 74%/lift or lower)  Function - Chair/bed transfer Chair/bed transfer method: Ambulatory Chair/bed transfer assist level: Supervision or verbal cues Chair/bed transfer assistive device: Armrests, Walker Chair/bed transfer details: Verbal cues for precautions/safety, Verbal cues for technique  Function - Locomotion: Wheelchair Will patient use wheelchair at discharge?: Yes Type: Manual Max wheelchair distance: 50 Assist Level: Moderate assistance (Pt 50 - 74%) Wheel 50 feet with 2 turns activity did not occur: Safety/medical concerns Assist Level: Moderate  assistance (Pt 50 - 74%) Wheel 150 feet activity did not occur: Safety/medical concerns Turns around,maneuvers to table,bed, and toilet,negotiates 3% grade,maneuvers on rugs and over doorsills: No Function - Locomotion: Ambulation Assistive device: Walker-rolling Max distance: 75 Assist level: Touching or steadying assistance (Pt > 75%) Assist level: Touching or steadying assistance (Pt > 75%) Walk 50 feet with 2 turns activity did not occur: Safety/medical concerns Assist level: Touching or steadying assistance (Pt > 75%) Walk 150 feet activity did not occur: Safety/medical concerns Walk 10 feet on uneven surfaces activity did not occur: Safety/medical concerns  Function - Comprehension Comprehension: Auditory Comprehension assist level: Follows complex conversation/direction with no assist  Function - Expression Expression: Verbal Expression assist level: Expresses complex ideas: With no assist  Function - Social Interaction Social Interaction assist level: Interacts appropriately with others - No medications needed.  Function - Problem Solving Problem solving assist level: Solves basic problems with no assist  Function - Memory Memory assist level: Complete Independence: No helper Patient normally able to recall (first 3 days only): Current season, That he or she is in a hospital, Location of own room  Medical Problem List and Plan: 1. Left hemiparesissecondary to right PLICinfarct- plavix   Cont CIR  2. DVT Prophylaxis/Anticoagulation: Subcutaneous heparin. Monitor platelet counts and  any signs of bleeding 3. Pain Management: Tylenol as needed 4. Mood: Provide emotional support 5. Neuropsych: This patient iscapable of making decisions on herown behalf. 6. Skin/Wound Care: Routine skin checks 7. Fluids/Electrolytes/Nutrition: Routine I&O with follow-up chemistries 8.Hypertension. Norvasc 10 mg daily, Imdur 60 mg daily. Monitor with increased mobility,  D/ced cozaar  50 due to Creat elevation   Lopressor 50 mg increased 100 on 8/30 Vitals:   10/02/16 1456 10/03/16 0522  BP: (!) 127/57 (!) 156/72  Pulse: 95 77  Resp: 18 16  Temp: 97.7 F (36.5 C) 98.8 F (37.1 C)  SpO2: 100% 99%   Labile, will order orthostatics 9.Diabetes mellitus with peripheral neuropathy. Hemoglobin A1c 7.7. SSI.   Glucotrol increased to 7. 5mg  qam 9/1 CBG (last 3)   Recent Labs  10/02/16 1643 10/02/16 2052 10/03/16 0700  GLUCAP 188* 151* 118*   Relatively controlled 9/3 10.CAD with CABG. Continue Plavix. No chest pain or shortness of breath 11.CKD stage III with AKI  Creatinine 2.04 on admission.   D/ced cozaar   Labs pending 12.Hyperlipidemia. Lipitor 13. ABLA  Hb 10.9 on 8/24  Labs pending  Cont to monitor  LOS (Days) 11 A FACE TO FACE EVALUATION WAS PERFORMED  Jasim Harari Lorie Phenix 10/03/2016, 8:36 AM

## 2016-10-03 NOTE — Progress Notes (Signed)
Occupational Therapy Session Note  Patient Details  Name: [name] MRN: [id number] Date of Birth: 1927/07/10   Today's Date: 10/03/2016  Session 1 OT Individual Time: 3754-3606 OT Individual Time Calculation (min): 89 min   Session 2 OT Individual Time: 1401-1503 OT Individual Time Calculation (min): 62 min   Short Term Goals: Week 2:  OT Short Term Goal 1 (Week 2): LTG=STG 2/2 ELOS  Skilled Therapeutic Interventions/Progress Updates:  Session 1   OT treatment session focused on functional ambulation, standing balance/endurance, L NMR, L fine-motor control. Worked on donning TED hose seated wc for LE edema. Pt with difficulty maintaining figure 4 position and needed assist to advance TED hose over heel. OT discussed energy conservation techniques, RW positioning, and kitchen modifications. Provided pt with walker bag and educated on ways to [employer] walker bag to increase independence. Worked on standing balance, activity tolerance, and L NMR within simple meal prep task. Pt needed min cues for RW positioning to access fridge, upper cabinets, and drawers.  Tactile cues to achieve upright hip and trunk extension when fatigued standing at counter. Pt needed hand over hand A to adjust grip on spoon w/ L hand and was able to stir with increased smoothness after 1 minute of repetition. Pt finished stirring using R hand for thoroughness w/ verbal cues to use L UE as a stabilizer. Tolerated 7 mins standing at longest bout, with high stool used to take rest breaks as needed (4 rest breaks total). OT placed muffins in oven for safety while discussed kitchen safety with pt. After extended rest break, pt stood at the sink to wash dishes with supervision for balance and tactile cues for posture and L hand positioning. Pt took additional rest break, then ambulated back to room with close supervision and RW. Pt left seated in wc with lunch set-up and RN present.   Session 2 OT treatment session focused on L  hand coordination, B UE strengthening, and standing tolerance. B UE coordination, timing, and sequencing with wc propulsion. Pt needed hand-over hand A to bring L UE far enough back to achieve strong push to propel wc forward on 50% of opportunities. General strengthening and standing endurance with 5 sit<>stands for 1 minute intervals. Tactile cues to maintain upright posture after 3rd stand. OT educated pt on community safety awareness and utilized L UE to push elevator buttons with increased time for smoothness. Medium grade yellow putty used for L fine motor coordination focused on in-hand manipulation, translation, rotation, pinch strength, and isolated finger strength. OT propelled wc 3/4 of the way back to room for time management, then pt propelled the rest of the way in similar fashion as above. Pt left seated in wc with needs met and call bell in reach.   Therapy Documentation Precautions:  Precautions Precautions: Fall Precaution Comments: L hemi Restrictions Weight Bearing Restrictions: No Pain:  none/denies  See Function Navigator for Current Functional Status.   Therapy/Group: Individual Therapy  Valma Cava 10/03/2016, 3:07 PM

## 2016-10-04 ENCOUNTER — Inpatient Hospital Stay (HOSPITAL_COMMUNITY): Payer: Medicare Other | Admitting: Physical Therapy

## 2016-10-04 ENCOUNTER — Inpatient Hospital Stay (HOSPITAL_COMMUNITY): Payer: Medicare Other | Admitting: Occupational Therapy

## 2016-10-04 LAB — GLUCOSE, CAPILLARY
GLUCOSE-CAPILLARY: 117 mg/dL — AB (ref 65–99)
Glucose-Capillary: 165 mg/dL — ABNORMAL HIGH (ref 65–99)
Glucose-Capillary: 150 mg/dL — ABNORMAL HIGH (ref 65–99)
Glucose-Capillary: 132 mg/dL — ABNORMAL HIGH (ref 65–99)

## 2016-10-04 MED ORDER — GLIPIZIDE 10 MG PO TABS
10.0000 mg | ORAL_TABLET | Freq: Every day | ORAL | Status: DC
  Administered 2016-10-05 – 2016-10-06 (×2): 10 mg via ORAL
  Filled 2016-10-04 (×2): qty 1

## 2016-10-04 MED ORDER — SODIUM CHLORIDE 0.45 % IV SOLN
INTRAVENOUS | Status: DC
  Administered 2016-10-04: 21:00:00 via INTRAVENOUS

## 2016-10-04 NOTE — Progress Notes (Signed)
Physical Therapy Session Note  Patient Details  Name: [name] MRN: [id number] Date of Birth: 1927-03-28  Today's Date: 10/04/2016 PT Individual Time: 0900-0959 PT Individual Time Calculation (min): 59 min   Short Term Goals: Week 2:  PT Short Term Goal 1 (Week 2): =LTGs due to ELOS  Skilled Therapeutic Interventions/Progress Updates:   Pt in w/c upon arrival and agreeable to therapy, no c/o pain. Pt requesting to use bathroom, ambulated to/from toilet and to sink w/ supervision using RW and pt performed pericare independently. Worked on improved posture w/ gait and standing in addition to standing balance.   Ambulated to/from therapy gym w/ supervision in 50-60' bouts w/ seated rest in between secondary to fatigue. Verbal cues to increase trunk extension in gait and to not lean onto UEs on RW.   Dynamic standing balance on firm surface w/ unilateral UE support while reaching forward and above to encourage thoracic extension in stance. 1-2 min bouts of stance w/ close supervision. Multiple seated rest breaks secondary to LE muscular fatigue, L>R.   Ended session in w/c, call bell within reach and all needs met.   Therapy Documentation Precautions:  Precautions Precautions: Fall Precaution Comments: L hemi Restrictions Weight Bearing Restrictions: No Vital Signs:  Pain: Pain Assessment Pain Assessment: No/denies pain Pain Score: 0-No pain  See Function Navigator for Current Functional Status.   Therapy/Group: Individual Therapy  Chontel Warning K Arnette 10/04/2016, 10:00 AM

## 2016-10-04 NOTE — Progress Notes (Signed)
Occupational Therapy Session Note  Patient Details  Name: [name] MRN: [id number] Date of Birth: September 08, 1927  Today's Date: 10/04/2016 OT Individual Time: 1100-1203 OT Individual Time Calculation (min): 63 min   Short Term Goals: Week 2:  OT Short Term Goal 1 (Week 2): LTG=STG 2/2 ELOS  Skilled Therapeutic Interventions/Progress Updates:    Worked on conning and tying shoes with pt able to achieve figure 4 position to don shoes, then demonstrated improved :L hand coordination as she was able to tie shoes today for the first time. Pt ambulated to the bathroom RW w. Supervision, voided bladder and completed 3/3 toileting steps mod I. Pt ambulated to therapy gym with RW and supervision with verbal cues for L foot clearance and cues for full hip/knee extension with steps. Pt needed 1 standing rest break, then took extended rest break once on therapy mat. Worked on glute, hip knee and trunk strength with therapeutic activity in tall kneeling. Pt needed facilitation to maintain extension when fatigued. Incorporated fine motor activity of graded bead threading with pt being able to thread 3 beads before reaching max fatigue. Pt then worked on L UE coordination and pinch strength with graded clothes pin task. Facilitated grasp to promote wrist extension and normal movement patterns. Pt with improved distal control of L UE Pt returned to room at end of session and left seated in wc with needs met.   Therapy Documentation Precautions:  Precautions Precautions: Fall Precaution Comments: L hemi Restrictions Weight Bearing Restrictions: No Pain: Pain Assessment Pain Assessment: No/denies pain Pain Score: 0-No pain Other Treatments:    See Function Navigator for Current Functional Status.  Therapy/Group: Individual Therapy  Valma Cava 10/04/2016, 11:17 AM

## 2016-10-04 NOTE — Progress Notes (Signed)
Physical Therapy Session Note  Patient Details  Name: [name] MRN: [id number] Date of Birth: [birth year]  Today's Date: 10/04/2016 PT Individual Time: 4193-7902 and 4097-3532 PT Individual Time Calculation (min): 40 min and 28 min   Short Term Goals: Week 2:  PT Short Term Goal 1 (Week 2): =LTGs due to ELOS  Skilled Therapeutic Interventions/Progress Updates:  Treatment 1: Pt received in room & agreeable to tx, denying c/o pain but reporting fatigue. Pt reports need to use bathroom and ambulates within room & bathroom with close supervision<>steady assist with RW; pt with difficulty clearing L foot and reports her feet feel heavy since today is the first day she was able to wear shoes 2/2 decreased edema. Pt completed clothing management, toilet transfers from elevated seat over commode and peri hygiene with supervision<>mod I. Pt completed hand hygiene standing at sink with RW & close supervision. Transported pt to gym via w/c total assist for time management & energy conservation. Pt completes w/c<>mat table with RW & close supervision via stand pivot. Pt transfers supine<>sitting on mat table with supervision and extra time. Pt completed BLE bridging with adductor hold and 3 second hold, then LLE single leg bridging. Therapist provides instructional cuing and assistance to maintain LLE neutral alignment when pt performs single leg bridging. Pt fatigues with task and requires rest breaks between each. Attempted gait without AD for NMR & strengthening but pt required max assist for 7 ft and task ended 2/2 impaired balance, NMR, and LLE control. At end of session pt left sitting in w/c in room with all needs within reach.   Treatment 2: Pt received in w/c & agreeable to tx, denying c/o pain but reporting fatigue. Transported pt to gym via w/c total assist for time management & energy conservation. Pt negotiated 4 steps + 4 steps with B rails & steady assist with cuing for compensatory pattern. Pt  stood on compliant surface while engaging in puzzle assembly; pt required LUE support on RW & min assist for balance. Pt also required rest break in between 2/2 fatigue. At end of session pt left sitting in w/c in room with all needs within reach.    Therapy Documentation Precautions:  Precautions Precautions: Fall Precaution Comments: L hemi Restrictions Weight Bearing Restrictions: No    See Function Navigator for Current Functional Status.   Therapy/Group: Individual Therapy  Waunita Schooner 10/04/2016, 3:42 PM

## 2016-10-04 NOTE — Progress Notes (Signed)
Subjective/Complaints: Bathing at sink Patient was very happy that she was able to make muffins in the ADL kitchen  ROS: Denies CP, SOB, N/V/D  Objective: Vital Signs: Blood pressure (!) 144/60, pulse 71, temperature 98.1 F (36.7 C), temperature source Oral, resp. rate 18, height 5' 3" (1.6 m), weight 76.2 kg (168 lb), SpO2 100 %. No results found. Results for orders placed or performed during the hospital encounter of 09/22/16 (from the past 72 hour(s))  Glucose, capillary     Status: Abnormal   Collection Time: 10/01/16 11:26 AM  Result Value Ref Range   Glucose-Capillary 204 (H) 65 - 99 mg/dL  Glucose, capillary     Status: Abnormal   Collection Time: 10/01/16  4:23 PM  Result Value Ref Range   Glucose-Capillary 182 (H) 65 - 99 mg/dL  Glucose, capillary     Status: Abnormal   Collection Time: 10/01/16  9:03 PM  Result Value Ref Range   Glucose-Capillary 155 (H) 65 - 99 mg/dL  Glucose, capillary     Status: Abnormal   Collection Time: 10/02/16  6:57 AM  Result Value Ref Range   Glucose-Capillary 138 (H) 65 - 99 mg/dL  Glucose, capillary     Status: Abnormal   Collection Time: 10/02/16 11:53 AM  Result Value Ref Range   Glucose-Capillary 130 (H) 65 - 99 mg/dL  Glucose, capillary     Status: Abnormal   Collection Time: 10/02/16  4:43 PM  Result Value Ref Range   Glucose-Capillary 188 (H) 65 - 99 mg/dL  Glucose, capillary     Status: Abnormal   Collection Time: 10/02/16  8:52 PM  Result Value Ref Range   Glucose-Capillary 151 (H) 65 - 99 mg/dL  Glucose, capillary     Status: Abnormal   Collection Time: 10/03/16  7:00 AM  Result Value Ref Range   Glucose-Capillary 118 (H) 65 - 99 mg/dL  Basic metabolic panel     Status: Abnormal   Collection Time: 10/03/16 11:08 AM  Result Value Ref Range   Sodium 135 135 - 145 mmol/L   Potassium 4.5 3.5 - 5.1 mmol/L   Chloride 106 101 - 111 mmol/L   CO2 21 (L) 22 - 32 mmol/L   Glucose, Bld 168 (H) 65 - 99 mg/dL   BUN 59 (H) 6 - 20  mg/dL   Creatinine, Ser 2.34 (H) 0.44 - 1.00 mg/dL   Calcium 10.0 8.9 - 10.3 mg/dL   GFR calc non Af Amer 18 (L) >60 mL/min   GFR calc Af Amer 20 (L) >60 mL/min    Comment: (NOTE) The eGFR has been calculated using the CKD EPI equation. This calculation has not been validated in all clinical situations. eGFR's persistently <60 mL/min signify possible Chronic Kidney Disease.    Anion gap 8 5 - 15  CBC with Differential/Platelet     Status: Abnormal   Collection Time: 10/03/16 11:08 AM  Result Value Ref Range   WBC 5.4 4.0 - 10.5 K/uL   RBC 3.51 (L) 3.87 - 5.11 MIL/uL   Hemoglobin 10.0 (L) 12.0 - 15.0 g/dL   HCT 31.3 (L) 36.0 - 46.0 %   MCV 89.2 78.0 - 100.0 fL   MCH 28.5 26.0 - 34.0 pg   MCHC 31.9 30.0 - 36.0 g/dL   RDW 14.2 11.5 - 15.5 %   Platelets 256 150 - 400 K/uL   Neutrophils Relative % 58 %   Neutro Abs 3.2 1.7 - 7.7 K/uL   Lymphocytes Relative 30 %  Lymphs Abs 1.6 0.7 - 4.0 K/uL   Monocytes Relative 8 %   Monocytes Absolute 0.4 0.1 - 1.0 K/uL   Eosinophils Relative 3 %   Eosinophils Absolute 0.2 0.0 - 0.7 K/uL   Basophils Relative 1 %   Basophils Absolute 0.0 0.0 - 0.1 K/uL  Glucose, capillary     Status: Abnormal   Collection Time: 10/03/16 12:23 PM  Result Value Ref Range   Glucose-Capillary 157 (H) 65 - 99 mg/dL  Glucose, capillary     Status: Abnormal   Collection Time: 10/03/16  4:38 PM  Result Value Ref Range   Glucose-Capillary 186 (H) 65 - 99 mg/dL  Glucose, capillary     Status: Abnormal   Collection Time: 10/03/16  9:09 PM  Result Value Ref Range   Glucose-Capillary 174 (H) 65 - 99 mg/dL  Glucose, capillary     Status: Abnormal   Collection Time: 10/04/16  6:09 AM  Result Value Ref Range   Glucose-Capillary 117 (H) 65 - 99 mg/dL     HEENT: normocephalic, Atraumatic Cardio: RRR and no JVD Resp: CTA B/L and unlabored GI: BS positive and ND Skin:   Intact. Warm and dry Neuro: Alert/Oriented Motor 4/5 Left delt bi tri grip (unchanged) LLE: 4/5  HF, KE ADF (stable) RUE/RLE: 5/5 proximal to distal Musc/Skel:  No edema. No tenderness. Gen NAD. Vital signs reviewed Psych: Normal mood and behavior   Assessment/Plan: 1. 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 Physiatrist is providing close team supervision and 24 hour management of active medical problems listed below. Physiatrist and rehab team continue to assess barriers to discharge/monitor patient progress toward functional and medical goals. FIM: Function - Bathing Position: Wheelchair/chair at sink Body parts bathed by patient: Right arm, Left arm, Chest, Abdomen, Front perineal area, Buttocks, Right upper leg, Left upper leg, Right lower leg, Left lower leg Body parts bathed by helper: Back Assist Level: Set up Set up : To obtain items  Function- Upper Body Dressing/Undressing What is the patient wearing?: Pull over shirt/dress Pull over shirt/dress - Perfomed by patient: Thread/unthread right sleeve, Thread/unthread left sleeve, Put head through opening, Pull shirt over trunk Pull over shirt/dress - Perfomed by helper: Thread/unthread left sleeve Assist Level: Set up Set up : To obtain clothing/put away Function - Lower Body Dressing/Undressing What is the patient wearing?: Pants, Underwear Position: Sitting EOB Underwear - Performed by patient: Thread/unthread right underwear leg, Pull underwear up/down, Thread/unthread left underwear leg Underwear - Performed by helper: Thread/unthread left underwear leg Pants- Performed by patient: Thread/unthread right pants leg, Thread/unthread left pants leg, Pull pants up/down Pants- Performed by helper: Pull pants up/down Non-skid slipper socks- Performed by patient: Don/doff left sock Non-skid slipper socks- Performed by helper: Don/doff right sock TED Hose - Performed by helper: Don/doff right TED hose, Don/doff  left TED hose Assist for footwear: Partial/moderate assist Assist for lower body dressing: Supervision or verbal cues  Function - Toileting Toileting steps completed by patient: Adjust clothing prior to toileting, Performs perineal hygiene, Adjust clothing after toileting Toileting steps completed by helper: Adjust clothing prior to toileting Toileting Assistive Devices: Grab bar or rail Assist level: Supervision or verbal cues  Function - Air cabin crew transfer assistive device: Walker, Grab bar Assist level to toilet: Supervision or verbal cues Assist level from toilet: Supervision or verbal cues Assist level to bedside commode (at bedside): Moderate assist (Pt 50 -  74%/lift or lower) (per Lynda Rainwater, NT) Assist level from bedside commode (at bedside): Moderate assist (Pt 50 - 74%/lift or lower)  Function - Chair/bed transfer Chair/bed transfer method: Stand pivot, Ambulatory Chair/bed transfer assist level: Supervision or verbal cues Chair/bed transfer assistive device: Armrests, Walker Chair/bed transfer details: Verbal cues for precautions/safety, Verbal cues for technique  Function - Locomotion: Wheelchair Will patient use wheelchair at discharge?: Yes Type: Manual Max wheelchair distance: 50 Assist Level: Moderate assistance (Pt 50 - 74%) Wheel 50 feet with 2 turns activity did not occur: Safety/medical concerns Assist Level: Moderate assistance (Pt 50 - 74%) Wheel 150 feet activity did not occur: Safety/medical concerns Turns around,maneuvers to table,bed, and toilet,negotiates 3% grade,maneuvers on rugs and over doorsills: No Function - Locomotion: Ambulation Assistive device: Walker-rolling Max distance: 75 Assist level: Touching or steadying assistance (Pt > 75%) Assist level: Touching or steadying assistance (Pt > 75%) Walk 50 feet with 2 turns activity did not occur: Safety/medical concerns Assist level: Touching or steadying assistance (Pt > 75%) Walk  150 feet activity did not occur: Safety/medical concerns Walk 10 feet on uneven surfaces activity did not occur: Safety/medical concerns  Function - Comprehension Comprehension: Auditory Comprehension assist level: Follows complex conversation/direction with no assist  Function - Expression Expression: Verbal Expression assist level: Expresses complex ideas: With no assist  Function - Social Interaction Social Interaction assist level: Interacts appropriately with others - No medications needed.  Function - Problem Solving Problem solving assist level: Solves basic problems with no assist  Function - Memory Memory assist level: Complete Independence: No helper Patient normally able to recall (first 3 days only): Current season, That he or she is in a hospital, Location of own room  Medical Problem List and Plan: 1. Left hemiparesissecondary to right PLICinfarct- plavix   Cont CIR PT, OT tent D/C 9/6 2. DVT Prophylaxis/Anticoagulation: Subcutaneous heparin. Monitor platelet counts and any signs of bleeding 3. Pain Management: Tylenol as needed 4. Mood: Provide emotional support 5. Neuropsych: This patient iscapable of making decisions on herown behalf. 6. Skin/Wound Care: Routine skin checks 7. Fluids/Electrolytes/Nutrition: Routine I&O with follow-up chemistries 8.Hypertension. Norvasc 10 mg daily, Imdur 60 mg daily. Monitor with increased mobility,  D/ced cozaar 50 due to Creat elevation   Lopressor 50 mg increased 100 on 8/30, HR in 70s Vitals:   10/03/16 1348 10/04/16 0531  BP: (!) 159/59 (!) 144/60  Pulse: 71 71  Resp: 18 18  Temp: 97.9 F (36.6 C) 98.1 F (36.7 C)  SpO2: 100% 100%   Labile, will order orthostatics 9.Diabetes mellitus with peripheral neuropathy. Hemoglobin A1c 7.7. SSI.   Glucotrol increased to 7. 66m qam 9/1, 179mam 9/4 CBG (last 3)   Recent Labs  10/03/16 1638 10/03/16 2109 10/04/16 0609  GLUCAP 186* 174* 117*   Relatively  controlled 9/4 10.CAD with CABG. Continue Plavix. No chest pain or shortness of breath 11.CKD stage III with AKI  Creatinine 2.04 on admission. 9/4 2.34, BUN 59, start IVF at noc  D/ced cozaar   12.Hyperlipidemia. Lipitor 13. ANemia  Hb 10.0 on 9/3  Labs pending  Cont to monitor 14.  Hx CHF Ej fx mildly reduced at 45% monitor for overload during IV hydration LOS (Days) 12 A FACE TO FACE EVALUATION WAS PERFORMED  KIRSTEINS,ANDREW E 10/04/2016, 7:33 AM

## 2016-10-05 ENCOUNTER — Inpatient Hospital Stay (HOSPITAL_COMMUNITY): Payer: Medicare Other | Admitting: Occupational Therapy

## 2016-10-05 ENCOUNTER — Inpatient Hospital Stay (HOSPITAL_COMMUNITY): Payer: Medicare Other | Admitting: Physical Therapy

## 2016-10-05 LAB — BLOOD GAS, VENOUS
ACID-BASE DEFICIT: 3.5 mmol/L — AB (ref 0.0–2.0)
BICARBONATE: 21.4 mmol/L (ref 20.0–28.0)
FIO2: 21
O2 Saturation: 65.4 %
PATIENT TEMPERATURE: 98.6
PH VEN: 7.337 (ref 7.250–7.430)
pCO2, Ven: 41 mmHg — ABNORMAL LOW (ref 44.0–60.0)
pO2, Ven: 37.4 mmHg (ref 32.0–45.0)

## 2016-10-05 LAB — BASIC METABOLIC PANEL
ANION GAP: 7 (ref 5–15)
BUN: 54 mg/dL — ABNORMAL HIGH (ref 6–20)
CALCIUM: 9.4 mg/dL (ref 8.9–10.3)
CO2: 21 mmol/L — AB (ref 22–32)
CREATININE: 2.24 mg/dL — AB (ref 0.44–1.00)
Chloride: 106 mmol/L (ref 101–111)
GFR calc Af Amer: 22 mL/min — ABNORMAL LOW (ref >60)
GFR calc non Af Amer: 19 mL/min — ABNORMAL LOW (ref >60)
GLUCOSE: 99 mg/dL (ref 65–99)
Potassium: 4.3 mmol/L (ref 3.5–5.1)
Sodium: 134 mmol/L — ABNORMAL LOW (ref 135–145)

## 2016-10-05 LAB — GLUCOSE, CAPILLARY
GLUCOSE-CAPILLARY: 176 mg/dL — AB (ref 65–99)
GLUCOSE-CAPILLARY: 156 mg/dL — AB (ref 65–99)
Glucose-Capillary: 103 mg/dL — ABNORMAL HIGH (ref 65–99)
Glucose-Capillary: 116 mg/dL — ABNORMAL HIGH (ref 65–99)

## 2016-10-05 MED ORDER — SODIUM CHLORIDE 0.45 % IV SOLN
INTRAVENOUS | Status: DC
  Administered 2016-10-05: 08:00:00 via INTRAVENOUS

## 2016-10-05 NOTE — Progress Notes (Addendum)
Occupational Therapy Discharge Summary  Patient Details  Name: [name] MRN: [id number] Date of Birth: 1927-10-31  Patient has met 10 of 10 long term goals due to improved activity tolerance, improved balance, postural control, ability to compensate for deficits, functional use of  RIGHT upper and RIGHT lower extremity, improved awareness and improved coordination.  Patient to discharge at overall Supervision/Modified Independent level.  Patient's care partner is independent to provide the necessary physical assistance at discharge.    Reasons goals not met: n/a  Recommendation:  Patient will benefit from ongoing skilled OT services in home health setting to continue to advance functional skills in the area of BADL.  Equipment: No equipment provided  Reasons for discharge: treatment goals met and discharge from hospital  Patient/family agrees with progress made and goals achieved: Yes  OT Discharge Precautions/Restrictions  Precautions Precautions: Fall Restrictions Weight Bearing Restrictions: No Pain  none/denies pain ADL ADL Eating: Independent Grooming: Modified independent Upper Body Bathing: Independent Where Assessed-Upper Body Bathing: Shower Lower Body Bathing: Supervision/safety Where Assessed-Lower Body Bathing: Shower Upper Body Dressing: Independent Lower Body Dressing: Modified independent Toileting: Modified independent Where Assessed-Toileting: raised seat over commode Toilet Transfer: Modified independent Toilet Transfer Method: Stand pivot Social research officer, government: Distant supervision Social research officer, government Method: Stand pivot ADL Comments: Please see functional navigator Perception  Perception: Within Functional Limits Praxis Praxis: Intact Cognition Overall Cognitive Status: Within Functional Limits for tasks assessed Arousal/Alertness: Awake/alert Orientation Level: Oriented X4 Sensation Sensation Light Touch: Appears  Intact Stereognosis: Appears Intact Hot/Cold: Appears Intact Proprioception: Appears Intact Coordination Coordination and Movement Description: Improved L hand coordination, increased smoothness and accuracy Motor  Motor Motor - Discharge Observations: mild L hemiparesis Mobility  Transfers Sit to Stand: 6: Modified independent (Device/Increase time) Stand to Sit: 6: Modified independent (Device/Increase time)  Trunk/Postural Assessment  Cervical Assessment Cervical Assessment: Within Functional Limits Thoracic Assessment Thoracic Assessment: Within Functional Limits Lumbar Assessment Lumbar Assessment: Within Functional Limits Postural Control Postural Control: Within Functional Limits  Balance Balance Balance Assessed: Yes Static Sitting Balance Static Sitting - Balance Support: Feet supported;No upper extremity supported Static Sitting - Level of Assistance: 7: Independent Dynamic Sitting Balance Dynamic Sitting - Balance Support: Feet unsupported;Left upper extremity supported;Right upper extremity supported;During functional activity Dynamic Sitting - Level of Assistance: 6: Modified independent (Device/Increase time) Static Standing Balance Static Standing - Balance Support: During functional activity Static Standing - Level of Assistance: 6: Modified independent (Device/Increase time) Dynamic Standing Balance Dynamic Standing - Balance Support: During functional activity Dynamic Standing - Level of Assistance: 6: Modified independent (Device/Increase time) Extremity/Trunk Assessment RUE Assessment RUE Assessment: Within Functional Limits LUE Assessment LUE Assessment: Within Functional Limits LUE Strength LUE Overall Strength Comments: Continues to have slight decrease in strength 4+/5, as well as some continued ataxia, but much improved and able to utilize L UE functionally as non-dominant hand   See Function Navigator for Current Functional Status.  Daneen Schick Mataya Kilduff 10/05/2016, 3:41 PM

## 2016-10-05 NOTE — Consult Note (Signed)
[name] is well known to me for CKD 3/4. She has a hx if DM, HBP and CAD.  On 09/06/16 BUN was 38 and creat 2.09mg //dl.  She was admitted recently with a left body stroke, had elevated BP and had more aggressive BP control. I was asked to comment on the rise in sCreat.  There has been no contrast exposure and the ARB she was taking was discontinued. On 9/5 creat was 2.24mg /dl and on 8/23 1.86mg /dl. She has some peripheral edema. She is getting IVFs. BP control now is better and near goal. The small rise in creat likely due to improved BP control.  I recommend stop IVF and cont current regimen.  I will see her in office for follow up. Dannielle Baskins C

## 2016-10-05 NOTE — Progress Notes (Signed)
Occupational Therapy Session Note  Patient Details  Name: [name] MRN: [id number] Date of Birth: 08/28/1927  Today's Date: 10/05/2016  Session 1 OT Individual Time: 0900-1000 OT Individual Time Calculation (min): 60 min   Session 2 OT Individual Time: 8307-4600 OT Individual Time Calculation (min): 44 min   Short Term Goals: Week 2:  OT Short Term Goal 1 (Week 2): LTG=STG 2/2 ELOS  Skilled Therapeutic Interventions/Progress Updates:  Session 1   OT treatment session focused on standing balance, core strengthening, B UE coordination, activity tolerance, and LB dressing techniques. Pt ambulated to sink w/ RW and tolerated 8 mins of standing BADL tasks. Tactile cues for hip and trunk extension in standing. When donning deodorant, had pt stand and reach without UE support to facilitate full trunk extension and crossing midline. Pt then finished dressing with supervision. Pt ambulated to therapy apartment with VC for L foot clearance. Discussed home bathroom set-up and demonstrated walk-in shower transfer simulated to home environment. Pt returned to room at end of session in similar fashion as above and left seated in wc with needs met.  Session 2 OT treatment session focused on improved sit<>stand, L UE fine motor coordination, and L NMR. Pt ambulated to therapy gym with supervision and RW. Pt completed standing letter tracing in mirror with letters placed overhead to promote trunk extension. Incorporated L finger isolation and proprioceptive feed back with tracing. Verbal cues to increase smoothness and adjust pressure through fingers. Pt then completed graded clothes pin task with clothesline again placed overhead. Pt completed 10 sit<>stands within activity with OT facilitating increased weight bearing through LLE. Pt ambulated back to room at end of session in similar fashion as before and left seated in wc with needs met.   Therapy Documentation Precautions:  Precautions Precautions:  Fall Precaution Comments: L hemi Restrictions Weight Bearing Restrictions: No Pain: None/denies pain  See Function Navigator for Current Functional Status.   Therapy/Group: Individual Therapy  Valma Cava 10/05/2016, 3:30 PM

## 2016-10-05 NOTE — Patient Care Conference (Signed)
Inpatient RehabilitationTeam Conference and Plan of Care Update Date: 10/05/2016   Time: 11:00 AM    Patient Name: [name]      Medical Record Number: [id number]  Date of Birth: September 06, 1927 Sex: Female         Room/Bed: 4M12C/4M12C-01 Payor Info: Payor: [employer] MEDICARE / Plan: [employer] MEDICARE / Product Type: *No Product type* /    Admitting Diagnosis: R CVA  Admit Date/Time:  09/22/2016  6:10 PM Admission Comments: No comment available   Primary Diagnosis:  Hemiparesis affecting left side as late effect of stroke ([place]) Principal Problem: Hemiparesis affecting left side as late effect of stroke [facility])  Patient Active Problem List   Diagnosis Date Noted  . Labile blood pressure   . Stage 3 chronic kidney disease   . AKI (acute kidney injury) ([place])   . Acute blood loss anemia   . Small vessel disease 09/22/2016  . Hemiparesis affecting left side as late effect of stroke ([place]) 09/22/2016  . Gait disturbance, post-stroke 09/22/2016  . Stroke (cerebrum) ([place]) 09/20/2016  . Diabetes mellitus with complication ([place])   . Ischemic stroke ([place])   . Status post intraocular lens implant 06/23/2011  . Glaucoma suspect 12/30/2010  . Macular hole 12/30/2010  . Nonproliferative diabetic retinopathy ([place]) 12/30/2010  . Syncope 06/17/2010  . CARDIOMYOPATHY 04/15/2010  . PLEURAL EFFUSION 04/14/2010  . SHORTNESS OF BREATH 04/14/2010  . CORONARY ATHEROSCLEROSIS NATIVE CORONARY ARTERY 01/19/2009  . Hyperlipidemia 01/06/2009  . Diabetes mellitus ([place]) 01/05/2009  . Essential hypertension 01/05/2009  . Coronary atherosclerosis 01/05/2009    Expected Discharge Date: Expected Discharge Date: 10/06/16  Team Members Present: Physician leading conference: Dr. Alysia [name] Social Worker Present: Darlene Kin, LCSW Nurse Present: Darlene Sneddon, RN PT Present: [name], PT OT Present: [name], OT SLP Present: [name], SLP PPS Coordinator present : Darlene Nakayama,  RN, CRRN     Current Status/Progress Goal Weekly Team Focus  Medical   Sup /mod I with therapy, elevated BUN/ min elevation Creat  maintain adequate fluid balance, control CBG  d/c planning   Bowel/Bladder   continent of bowel and bladder; LBM 9/3 after sorbitol  Mod I assist  Assess and treat for constipation as needed   Swallow/Nutrition/ Hydration   Encouraging PO intake for dehydration; nocturnal IVF for now         ADL's   supervision  Mod I/supervision   L UE NMR, dc planning, activity tolerance, pt/family ed, bathing/dressing   Mobility   supervision overall  mod I tranfsers, supervision gait  posture with gait and extended standing   Communication             Safety/Cognition/ Behavioral Observations            Pain   Denies pain  < 3  Assess and treat for pain q shift and prn   Skin   skin intact     Assess skin q shift and prn      *See Care Plan and progress notes for long and short-term goals.     Barriers to Discharge  Current Status/Progress Possible Resolutions Date Resolved   Physician    Medical stability;Decreased caregiver support  receiving IVF  should be ready for d/c in am  see above, get nephro input      Nursing                  PT  Inaccessible home environment;Decreased caregiver support;Home environment access/layout;Lack of/limited family  support  Son planning to live w/ pt until she is able to live on her own again              OT                  SLP                SW                Discharge Planning/Teaching Needs:  HOme tomorrow, family to provide 24 hr supervision       Team Discussion:  Reaching goals of supervision level-some mod/i level. Getting IV fluids today due to dehydrated. Neph MD consult for renal function, to see today. Activity tolerance better and ready for discharge tomorrow as long as renal function normal.  Revisions to Treatment Plan:  DC 9/6    Continued Need for Acute Rehabilitation Level of Care: The  patient requires daily medical management by a physician with specialized training in physical medicine and rehabilitation for the following conditions: Daily direction of a multidisciplinary physical rehabilitation program to ensure safe treatment while eliciting the highest outcome that is of practical value to the patient.: Yes Daily medical management of patient stability for increased activity during participation in an intensive rehabilitation regime.: Yes Daily analysis of laboratory values and/or radiology reports with any subsequent need for medication adjustment of medical intervention for : Neurological problems;Renal problems  [name] 10/05/2016, 1:43 PM

## 2016-10-05 NOTE — Progress Notes (Signed)
Subjective/Complaints: Pt was up urinating with IVF, has been off cozaar for <1wk  ROS: Denies CP, SOB, N/V/D  Objective: Vital Signs: Blood pressure (!) 162/61, pulse 67, temperature 98.7 F (37.1 C), temperature source Oral, resp. rate (!) 189, height 5' 3"  (1.6 m), weight 76.2 kg (168 lb), SpO2 100 %. No results found. Results for orders placed or performed during the hospital encounter of 09/22/16 (from the past 72 hour(s))  Glucose, capillary     Status: Abnormal   Collection Time: 10/02/16 11:53 AM  Result Value Ref Range   Glucose-Capillary 130 (H) 65 - 99 mg/dL  Glucose, capillary     Status: Abnormal   Collection Time: 10/02/16  4:43 PM  Result Value Ref Range   Glucose-Capillary 188 (H) 65 - 99 mg/dL  Glucose, capillary     Status: Abnormal   Collection Time: 10/02/16  8:52 PM  Result Value Ref Range   Glucose-Capillary 151 (H) 65 - 99 mg/dL  Glucose, capillary     Status: Abnormal   Collection Time: 10/03/16  7:00 AM  Result Value Ref Range   Glucose-Capillary 118 (H) 65 - 99 mg/dL  Basic metabolic panel     Status: Abnormal   Collection Time: 10/03/16 11:08 AM  Result Value Ref Range   Sodium 135 135 - 145 mmol/L   Potassium 4.5 3.5 - 5.1 mmol/L   Chloride 106 101 - 111 mmol/L   CO2 21 (L) 22 - 32 mmol/L   Glucose, Bld 168 (H) 65 - 99 mg/dL   BUN 59 (H) 6 - 20 mg/dL   Creatinine, Ser 2.34 (H) 0.44 - 1.00 mg/dL   Calcium 10.0 8.9 - 10.3 mg/dL   GFR calc non Af Amer 18 (L) >60 mL/min   GFR calc Af Amer 20 (L) >60 mL/min    Comment: (NOTE) The eGFR has been calculated using the CKD EPI equation. This calculation has not been validated in all clinical situations. eGFR's persistently <60 mL/min signify possible Chronic Kidney Disease.    Anion gap 8 5 - 15  CBC with Differential/Platelet     Status: Abnormal   Collection Time: 10/03/16 11:08 AM  Result Value Ref Range   WBC 5.4 4.0 - 10.5 K/uL   RBC 3.51 (L) 3.87 - 5.11 MIL/uL   Hemoglobin 10.0 (L) 12.0 -  15.0 g/dL   HCT 31.3 (L) 36.0 - 46.0 %   MCV 89.2 78.0 - 100.0 fL   MCH 28.5 26.0 - 34.0 pg   MCHC 31.9 30.0 - 36.0 g/dL   RDW 14.2 11.5 - 15.5 %   Platelets 256 150 - 400 K/uL   Neutrophils Relative % 58 %   Neutro Abs 3.2 1.7 - 7.7 K/uL   Lymphocytes Relative 30 %   Lymphs Abs 1.6 0.7 - 4.0 K/uL   Monocytes Relative 8 %   Monocytes Absolute 0.4 0.1 - 1.0 K/uL   Eosinophils Relative 3 %   Eosinophils Absolute 0.2 0.0 - 0.7 K/uL   Basophils Relative 1 %   Basophils Absolute 0.0 0.0 - 0.1 K/uL  Glucose, capillary     Status: Abnormal   Collection Time: 10/03/16 12:23 PM  Result Value Ref Range   Glucose-Capillary 157 (H) 65 - 99 mg/dL  Glucose, capillary     Status: Abnormal   Collection Time: 10/03/16  4:38 PM  Result Value Ref Range   Glucose-Capillary 186 (H) 65 - 99 mg/dL  Glucose, capillary     Status: Abnormal   Collection  Time: 10/03/16  9:09 PM  Result Value Ref Range   Glucose-Capillary 174 (H) 65 - 99 mg/dL  Glucose, capillary     Status: Abnormal   Collection Time: 10/04/16  6:09 AM  Result Value Ref Range   Glucose-Capillary 117 (H) 65 - 99 mg/dL  Glucose, capillary     Status: Abnormal   Collection Time: 10/04/16 11:24 AM  Result Value Ref Range   Glucose-Capillary 165 (H) 65 - 99 mg/dL  Glucose, capillary     Status: Abnormal   Collection Time: 10/04/16  4:43 PM  Result Value Ref Range   Glucose-Capillary 150 (H) 65 - 99 mg/dL  Glucose, capillary     Status: Abnormal   Collection Time: 10/04/16  9:18 PM  Result Value Ref Range   Glucose-Capillary 132 (H) 65 - 99 mg/dL  Basic metabolic panel     Status: Abnormal   Collection Time: 10/05/16  4:06 AM  Result Value Ref Range   Sodium 134 (L) 135 - 145 mmol/L   Potassium 4.3 3.5 - 5.1 mmol/L   Chloride 106 101 - 111 mmol/L   CO2 21 (L) 22 - 32 mmol/L   Glucose, Bld 99 65 - 99 mg/dL   BUN 54 (H) 6 - 20 mg/dL   Creatinine, Ser 2.24 (H) 0.44 - 1.00 mg/dL   Calcium 9.4 8.9 - 10.3 mg/dL   GFR calc non Af  Amer 19 (L) >60 mL/min   GFR calc Af Amer 22 (L) >60 mL/min    Comment: (NOTE) The eGFR has been calculated using the CKD EPI equation. This calculation has not been validated in all clinical situations. eGFR's persistently <60 mL/min signify possible Chronic Kidney Disease.    Anion gap 7 5 - 15  Glucose, capillary     Status: Abnormal   Collection Time: 10/05/16  6:50 AM  Result Value Ref Range   Glucose-Capillary 103 (H) 65 - 99 mg/dL     HEENT: normocephalic, Atraumatic Cardio: RRR and no JVD Resp: CTA B/L and unlabored GI: BS positive and ND Skin:   Intact. Warm and dry Neuro: Alert/Oriented Motor 4/5 Left delt bi tri grip (unchanged) LLE: 4/5 HF, KE ADF (stable) RUE/RLE: 5/5 proximal to distal Musc/Skel:  No edema. No tenderness. Gen NAD. Vital signs reviewed Psych: Normal mood and behavior   Assessment/Plan: 1. 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 Physiatrist is providing close team supervision and 24 hour management of active medical problems listed below. Physiatrist and rehab team continue to assess barriers to discharge/monitor patient progress toward functional and medical goals. FIM: Function - Bathing Position: Wheelchair/chair at sink Body parts bathed by patient: Right arm, Left arm, Chest, Abdomen, Front perineal area, Buttocks, Right upper leg, Left upper leg, Right lower leg, Left lower leg Body parts bathed by helper: Back Assist Level: Set up Set up : To obtain items  Function- Upper Body Dressing/Undressing What is the patient wearing?: Pull over shirt/dress Pull over shirt/dress - Perfomed by patient: Thread/unthread right sleeve, Thread/unthread left sleeve, Put head through opening, Pull shirt over trunk Pull over shirt/dress - Perfomed by helper: Thread/unthread left sleeve Assist Level: Set up Set up : To obtain clothing/put  away Function - Lower Body Dressing/Undressing What is the patient wearing?: Pants, Underwear Position: Sitting EOB Underwear - Performed by patient: Thread/unthread right underwear leg, Pull underwear up/down, Thread/unthread left underwear leg Underwear - Performed by helper: Thread/unthread  left underwear leg Pants- Performed by patient: Thread/unthread right pants leg, Thread/unthread left pants leg, Pull pants up/down Pants- Performed by helper: Pull pants up/down Non-skid slipper socks- Performed by patient: Don/doff left sock Non-skid slipper socks- Performed by helper: Don/doff right sock TED Hose - Performed by helper: Don/doff right TED hose, Don/doff left TED hose Assist for footwear: Partial/moderate assist Assist for lower body dressing: Supervision or verbal cues  Function - Toileting Toileting steps completed by patient: Adjust clothing prior to toileting, Performs perineal hygiene, Adjust clothing after toileting Toileting steps completed by helper: Adjust clothing prior to toileting Toileting Assistive Devices: Grab bar or rail Assist level: Supervision or verbal cues  Function - Air cabin crew transfer assistive device: Elevated toilet seat/BSC over toilet, Walker Assist level to toilet: Supervision or verbal cues Assist level from toilet: Supervision or verbal cues Assist level to bedside commode (at bedside): Moderate assist (Pt 50 - 74%/lift or lower) (per Lynda Rainwater, NT) Assist level from bedside commode (at bedside): Moderate assist (Pt 50 - 74%/lift or lower)  Function - Chair/bed transfer Chair/bed transfer method: Stand pivot Chair/bed transfer assist level: Supervision or verbal cues Chair/bed transfer assistive device: Armrests Chair/bed transfer details: Verbal cues for precautions/safety, Verbal cues for technique  Function - Locomotion: Wheelchair Will patient use wheelchair at discharge?: Yes Type: Manual Max wheelchair distance:  50 Assist Level: Moderate assistance (Pt 50 - 74%) Wheel 50 feet with 2 turns activity did not occur: Safety/medical concerns Assist Level: Moderate assistance (Pt 50 - 74%) Wheel 150 feet activity did not occur: Safety/medical concerns Turns around,maneuvers to table,bed, and toilet,negotiates 3% grade,maneuvers on rugs and over doorsills: No Function - Locomotion: Ambulation Assistive device: Walker-rolling Max distance: 15 ft Assist level: Touching or steadying assistance (Pt > 75%) Assist level: Touching or steadying assistance (Pt > 75%) Walk 50 feet with 2 turns activity did not occur: Safety/medical concerns Assist level: Supervision or verbal cues Walk 150 feet activity did not occur: Safety/medical concerns Walk 10 feet on uneven surfaces activity did not occur: Safety/medical concerns  Function - Comprehension Comprehension: Auditory Comprehension assist level: Understands complex 90% of the time/cues 10% of the time  Function - Expression Expression: Verbal Expression assist level: Expresses complex 90% of the time/cues < 10% of the time  Function - Social Interaction Social Interaction assist level: Interacts appropriately with others - No medications needed.  Function - Problem Solving Problem solving assist level: Solves basic problems with no assist  Function - Memory Memory assist level: Complete Independence: No helper Patient normally able to recall (first 3 days only): Current season, That he or she is in a hospital, Staff names and faces, Location of own room  Medical Problem List and Plan: 1. Left hemiparesissecondary to right PLICinfarct- plavix   Cont CIR PT, OT tent D/C 9/6 2. DVT Prophylaxis/Anticoagulation: Subcutaneous heparin. Monitor platelet counts and any signs of bleeding 3. Pain Management: Tylenol as needed 4. Mood: Provide emotional support 5. Neuropsych: This patient iscapable of making decisions on herown behalf. 6. Skin/Wound Care:  Routine skin checks 7. Fluids/Electrolytes/Nutrition: Routine I&O with follow-up chemistries 8.Hypertension. Norvasc 10 mg daily, Imdur 60 mg daily. Monitor with increased mobility,  D/ced cozaar 50 due to Creat elevation   Lopressor 50 mg increased 100 on 8/30, HR in 60s, BP fluctuating Vitals:   10/04/16 1337 10/05/16 0341  BP: (!) 139/56 (!) 162/61  Pulse: 65 67  Resp: 18 (!) 189  Temp: 97.9 F (36.6 C) 98.7 F (37.1 C)  SpO2: 100% 100%   Labile, will order orthostatics 9.Diabetes mellitus with peripheral neuropathy. Hemoglobin A1c 7.7. SSI.   Glucotrol increased to 7. 80m qam 9/1, 146mam 9/4 CBG (last 3)   Recent Labs  10/04/16 1643 10/04/16 2118 10/05/16 0650  GLUCAP 150* 132* 103*    controlled 9/5 10.CAD with CABG. Continue Plavix. No chest pain or shortness of breath 11.CKD stage III with AKI  Creatinine 2.04 on admission. 9/4 2.34, BUN 59, start IVF during the day  D/ced cozaar - has seen Nephro as outpt Dr PoFlorene Glensk fro input  12.Hyperlipidemia. Lipitor 13. ANemia  Hb 10.0 on 9/3  Labs pending  Cont to monitor 14.  Hx CHF Ej fx mildly reduced at 45% monitor for overload during IV hydration LOS (Days) 13 A FACE TO FACE EVALUATION WAS PERFORMED  Arshia Rondon E 10/05/2016, 8:04 AM

## 2016-10-05 NOTE — Discharge Summary (Signed)
Discharge summary job # [id number]

## 2016-10-05 NOTE — Plan of Care (Signed)
Problem: RH Car Transfers Goal: LTG Patient will perform car transfers with assist (PT) LTG: Patient will perform car transfers with assistance (PT).  Outcome: Adequate for Discharge Requires min assist for transfer into simulator car 2/2 high floor board, expect pt will be able to transfer with supervision in and out of d/c vehicle

## 2016-10-05 NOTE — Progress Notes (Signed)
Physical Therapy Discharge Summary  Patient Details  Name: [name] MRN: [id number] Date of Birth: 01-22-1928  Today's Date: 10/05/2016 PT Individual Time: 1000-1100 and 1600-1630 PT Individual Time Calculation (min): 60 min and 30 min    Patient has met 7 of 7 long term goals due to improved activity tolerance, improved balance, improved postural control, increased strength, ability to compensate for deficits, functional use of  right upper extremity and right lower extremity, improved attention, improved awareness and improved coordination.  Patient to discharge at household ambulatory level Supervision.   No family present during LOS for family education.  Provided family with handout for pt's needs at d/c.   Recommendation:  Patient will benefit from ongoing skilled PT services in home health setting to continue to advance safe functional mobility, address ongoing impairments in balance, posture, activity tolerance, strength, coordination, and safety awareness, and minimize fall risk.  Equipment: No equipment provided  Reasons for discharge: treatment goals met  Patient/family agrees with progress made and goals achieved: Yes   Skilled PT Intervention: Session 1: no c/o pain.  Session focus on pt education and d/c assessment.  Pt performs toilet transfers and toileting with RW and set up assist for management of IV pole.  Transfers throughout session with mod I.  Supervision for gait with RW, min verbal cues for posture, 135' maximum.  Pt negotiated 4 steps with supervision for safety.  Returned to room at end of session and positioned in w/c with call bell in reach and needs met.   Session 2: no c/o pain.  PT reviewed d/c sheet with pt regarding recommendations from PT/OT for family supervision and use of RW for all mobility at home.  Pt verbalized understanding of all recommendations and states she will show this to her son this evening.  PT readministered Berg Balance Scale and pt  continues to demonstrate high fall risk as noted by score of 35/56 on Berg Balance Scale.  PT reviewed results with pt and emphasized improvement in weight shifting, and turning compared to previous BERG Score.  Expect pt will continue to improve with skilled PT in next venue of care.  Pt returned to room at end of session with RW and supervision, call bell in reach and needs met.    PT Discharge Precautions/Restrictions Precautions Precautions: Fall Precaution Comments: L hemi Restrictions Weight Bearing Restrictions: No Pain Pain Assessment Pain Assessment: No/denies pain Vision/Perception  Perception Perception: Within Functional Limits Praxis Praxis: Intact  Cognition Overall Cognitive Status: Within Functional Limits for tasks assessed Arousal/Alertness: Awake/alert Orientation Level: Oriented X4 Attention: Selective Focused Attention: Appears intact Sustained Attention: Appears intact Selective Attention: Appears intact Memory: Appears intact Awareness: Appears intact Problem Solving: Appears intact Safety/Judgment: Appears intact Sensation Sensation Light Touch: Appears Intact Coordination Gross Motor Movements are Fluid and Coordinated: Yes Fine Motor Movements are Fluid and Coordinated: Yes Motor  Motor Motor: Hemiplegia Motor - Discharge Observations: L hemiparesis, mild   Mobility Bed Mobility Bed Mobility: Not assessed (pt in w/c on arrival) Transfers Transfers: Yes Sit to Stand: 6: Modified independent (Device/Increase time) Stand to Sit: 6: Modified independent (Device/Increase time) Stand Pivot Transfers: 6: Modified independent (Device/Increase time) Locomotion  Ambulation Ambulation/Gait Assistance: 5: Supervision Ambulation Distance (Feet): 135 Feet Assistive device: Rolling walker Ambulation/Gait Assistance Details: verbal cues for posture Gait Gait: Yes Gait Pattern: Impaired Gait Pattern: Decreased step length - left;Narrow base of  support;Trunk flexed;Poor foot clearance - left (worsening crouched gait with fatigue) Stairs / Additional Locomotion Stairs: Yes Stairs  Assistance: 5: Supervision Stair Management Technique: Two rails Number of Stairs: 4 Height of Stairs: 6 Wheelchair Mobility Wheelchair Mobility: No  Trunk/Postural Assessment  Cervical Assessment Cervical Assessment: Within Functional Limits Thoracic Assessment Thoracic Assessment: Within Functional Limits Lumbar Assessment Lumbar Assessment: Within Functional Limits Postural Control Postural Control: Within Functional Limits  Balance Static Sitting Balance Static Sitting - Balance Support: Feet supported;No upper extremity supported Static Sitting - Level of Assistance: 7: Independent Dynamic Sitting Balance Dynamic Sitting - Balance Support: Feet unsupported;Left upper extremity supported;Right upper extremity supported;During functional activity Dynamic Sitting - Level of Assistance: 5: Stand by assistance Static Standing Balance Static Standing - Balance Support: Right upper extremity supported;Left upper extremity supported;During functional activity Static Standing - Level of Assistance: 5: Stand by assistance Dynamic Standing Balance Dynamic Standing - Balance Support: Left upper extremity supported;Right upper extremity supported;During functional activity Dynamic Standing - Level of Assistance: 5: Stand by assistance Dynamic Standing - Comments: during standing card matching activity Extremity Assessment      RLE AROM (degrees) RLE Overall AROM Comments: WFL assessed in sitting RLE Strength Right Hip Flexion: 3+/5 Right Knee Flexion: 3+/5 Right Knee Extension: 4/5 Right Ankle Dorsiflexion: 3+/5 Right Ankle Plantar Flexion: 3+/5 LLE Assessment LLE Assessment: Exceptions to WFL LLE AROM (degrees) LLE Overall AROM Comments: WFL assessed in sitting LLE Strength Left Hip Flexion: 3/5 Left Knee Flexion: 3-/5 Left Knee Extension:  3/5 Left Ankle Dorsiflexion: 3/5 Left Ankle Plantar Flexion: 3/5   See Function Navigator for Current Functional Status.  [name] 10/05/2016, 10:51 AM

## 2016-10-05 NOTE — Discharge Summary (Signed)
NAME:  ANTONIO, WOODHAMS NO.:  [id number]  MEDICAL RECORD NO.:  [id number]  LOCATION:  4M12C                        FACILITY:  [facility]  PHYSICIAN:  Lauraine Rinne, P.A.  DATE OF BIRTH:  07/26/1927  DATE OF ADMISSION:  09/22/2016 DATE OF DISCHARGE:  10/06/2016                              DISCHARGE SUMMARY   DISCHARGE DIAGNOSES: 1. 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 2. Subcutaneous heparin for deep venous thrombosis prophylaxis. 3. Hypertension. 4. Diabetes mellitus. 5. Coronary artery disease with coronary artery bypass grafting. 6. Chronic kidney disease stage 3. 7. Hyperlipidemia. 8. Anemia. 9. History of congestive heart failure.  HISTORY OF PRESENT ILLNESS:  This is an 83 year old right-handed female with history of CAD with CABG, diabetes mellitus, CKD stage 3.  Lives with her daughter, independent prior to admission.  Presented September 20, 2016, with left-sided weakness.  Cranial CT scan negative.  The patient did not receive tPA.  MRI of the brain showed 11 mm acute early subacute infarction involving the right posterior limb of internal capsule and lateral thalamus.  MRA with no large vessel occlusion or aneurysm. Echocardiogram with ejection fraction of 93%, grade 1 diastolic dysfunction.  Maintained on Plavix for CVA prophylaxis.  Subcutaneous heparin for DVT prophylaxis.  Physical and occupational therapy ongoing. The patient was admitted for comprehensive rehab program.  PAST MEDICAL HISTORY:  See discharge diagnoses.  SOCIAL HISTORY:  Lives with daughter, independent prior to admission.  FUNCTIONAL STATUS UPON ADMISSION TO REHAB SERVICES:  Moderate assist stand pivot transfers, minimal guard sit to supine, mod max assist activities of daily living.  PHYSICAL EXAMINATION:  VITAL SIGNS:  Blood pressure 160/64, pulse 59, temperature 98, and respirations 18. GENERAL:  This was an alert female, oriented x3. HEENT:  EOMs  intact. NECK:  Supple.  Nontender.  No JVD. CARDIAC:  Rate controlled. ABDOMEN:  Soft, nontender.  Good bowel sounds. LUNGS:  Clear to auscultation without wheeze.  REHABILITATION HOSPITAL COURSE:  The patient was admitted to Inpatient Rehab Services.  Therapies initiated on a 3-hour daily basis consisting of physical therapy, occupational therapy, and rehabilitation nursing. The following issues were addressed during the patient's rehabilitation stay.  Pertaining to Mrs. Strub' right PLIC infarction, remained stable. Maintained on Plavix therapy.  She would follow up with Neurology Services.  Subcutaneous heparin for DVT prophylaxis.  Blood pressures controlled with Norvasc as well as Imdur and Lopressor.  Her Cozaar had been discontinued due to some creatinine elevation with history of CKD stage 3 and will follow up outpatient with renal services latest creatinine 2.24.  Blood sugars overall controlled.  Hemoglobin A1c of 7.7.  She continued on Glucotrol, full diabetic teaching. On the evening of 10/05/2016 patient with nonspecific chest pain and elevated blood pressure 207/82. Close monitoring at chest x-ray was later completed showing no signs of failure creatinine remained stable with follow-up renal services. Troponin ordered and results pending cardiology contacted was felt due to patient's history of CABG elevated blood pressure that should be discharged to acute care services maintained on telemetry. EKG showed no acute changes. The patient received weekly collaborative interdisciplinary team conferences to discuss estimated length of stay, family teaching, any  barriers to discharge. She was ambulating 40 feet x 2, rolling walker supervision.  Working with dynamic sitting and standing balance.  Transfers from bathing and dressing from wheelchair at sink with minimal verbal cues.  She could gather belongings for activities of daily living and homemaking.  Discharge medications will be  dictated as per cardiology services.  DIET:  Her diet was diabetic diet.  FOLLOWUP:  She would follow up with Dr. Alysia Penna at the outpatient rehab service office as advised; Dr. Antony Contras, Neurology Service, call for appointment; Dr. Thayer Jew far medical management.     Lauraine Rinne, P.A.     DA/MEDQ  D:  10/05/2016  T:  10/05/2016  Job:  [id number]  cc:   Pramod P. Leonie Man, MD Charlett Blake, M.D. Lynnell Chad. Shelia Media, M.D.

## 2016-10-06 ENCOUNTER — Inpatient Hospital Stay (HOSPITAL_COMMUNITY): Payer: Medicare Other

## 2016-10-06 ENCOUNTER — Observation Stay (HOSPITAL_COMMUNITY)
Admission: AD | Admit: 2016-10-06 | Discharge: 2016-10-08 | Disposition: A | Discharge status: Home / Self Care | Payer: Medicare Other | Source: Ambulatory Visit | Attending: Cardiology | Admitting: Cardiology

## 2016-10-06 ENCOUNTER — Other Ambulatory Visit: Payer: Self-pay

## 2016-10-06 ENCOUNTER — Encounter (HOSPITAL_COMMUNITY): Payer: Self-pay | Admitting: Physician Assistant

## 2016-10-06 DIAGNOSIS — I69354 Hemiplegia and hemiparesis following cerebral infarction affecting left non-dominant side: Principal | ICD-10-CM | POA: Insufficient documentation

## 2016-10-06 DIAGNOSIS — D62 Acute posthemorrhagic anemia: Secondary | ICD-10-CM | POA: Insufficient documentation

## 2016-10-06 DIAGNOSIS — N183 Chronic kidney disease, stage 3 unspecified: Secondary | ICD-10-CM | POA: Diagnosis present

## 2016-10-06 DIAGNOSIS — E1151 Type 2 diabetes mellitus with diabetic peripheral angiopathy without gangrene: Secondary | ICD-10-CM | POA: Insufficient documentation

## 2016-10-06 DIAGNOSIS — Z951 Presence of aortocoronary bypass graft: Secondary | ICD-10-CM | POA: Insufficient documentation

## 2016-10-06 DIAGNOSIS — I639 Cerebral infarction, unspecified: Secondary | ICD-10-CM | POA: Diagnosis present

## 2016-10-06 DIAGNOSIS — E1122 Type 2 diabetes mellitus with diabetic chronic kidney disease: Secondary | ICD-10-CM | POA: Insufficient documentation

## 2016-10-06 DIAGNOSIS — R079 Chest pain, unspecified: Secondary | ICD-10-CM | POA: Diagnosis present

## 2016-10-06 DIAGNOSIS — J90 Pleural effusion, not elsewhere classified: Secondary | ICD-10-CM | POA: Insufficient documentation

## 2016-10-06 DIAGNOSIS — I700 Atherosclerosis of aorta: Secondary | ICD-10-CM | POA: Insufficient documentation

## 2016-10-06 DIAGNOSIS — I252 Old myocardial infarction: Secondary | ICD-10-CM | POA: Insufficient documentation

## 2016-10-06 DIAGNOSIS — Z888 Allergy status to other drugs, medicaments and biological substances status: Secondary | ICD-10-CM | POA: Insufficient documentation

## 2016-10-06 DIAGNOSIS — I129 Hypertensive chronic kidney disease with stage 1 through stage 4 chronic kidney disease, or unspecified chronic kidney disease: Secondary | ICD-10-CM | POA: Insufficient documentation

## 2016-10-06 DIAGNOSIS — I2510 Atherosclerotic heart disease of native coronary artery without angina pectoris: Secondary | ICD-10-CM | POA: Insufficient documentation

## 2016-10-06 DIAGNOSIS — Z794 Long term (current) use of insulin: Secondary | ICD-10-CM | POA: Insufficient documentation

## 2016-10-06 DIAGNOSIS — Z955 Presence of coronary angioplasty implant and graft: Secondary | ICD-10-CM | POA: Insufficient documentation

## 2016-10-06 DIAGNOSIS — N179 Acute kidney failure, unspecified: Secondary | ICD-10-CM | POA: Insufficient documentation

## 2016-10-06 DIAGNOSIS — Z7982 Long term (current) use of aspirin: Secondary | ICD-10-CM | POA: Insufficient documentation

## 2016-10-06 DIAGNOSIS — Z79899 Other long term (current) drug therapy: Secondary | ICD-10-CM | POA: Insufficient documentation

## 2016-10-06 LAB — BASIC METABOLIC PANEL
ANION GAP: 5 (ref 5–15)
BUN: 51 mg/dL — ABNORMAL HIGH (ref 6–20)
CO2: 25 mmol/L (ref 22–32)
Calcium: 10 mg/dL (ref 8.9–10.3)
Chloride: 108 mmol/L (ref 101–111)
Creatinine, Ser: 2.14 mg/dL — ABNORMAL HIGH (ref 0.44–1.00)
GFR calc non Af Amer: 20 mL/min — ABNORMAL LOW (ref >60)
GFR, EST AFRICAN AMERICAN: 23 mL/min — AB (ref >60)
GLUCOSE: 136 mg/dL — AB (ref 65–99)
Potassium: 4.4 mmol/L (ref 3.5–5.1)
Sodium: 138 mmol/L (ref 135–145)

## 2016-10-06 LAB — TROPONIN I

## 2016-10-06 LAB — CREATININE, SERUM
Creatinine, Ser: 2.46 mg/dL — ABNORMAL HIGH (ref 0.44–1.00)
GFR calc Af Amer: 19 mL/min — ABNORMAL LOW (ref >60)
GFR calc non Af Amer: 17 mL/min — ABNORMAL LOW (ref >60)

## 2016-10-06 LAB — CBC
HCT: 33.8 % — ABNORMAL LOW (ref 36.0–46.0)
Hemoglobin: 11.1 g/dL — ABNORMAL LOW (ref 12.0–15.0)
MCH: 29.2 pg (ref 26.0–34.0)
MCHC: 32.8 g/dL (ref 30.0–36.0)
MCV: 88.9 fL (ref 78.0–100.0)
PLATELETS: 257 10*3/uL (ref 150–400)
RBC: 3.8 MIL/uL — AB (ref 3.87–5.11)
RDW: 14.5 % (ref 11.5–15.5)
WBC: 5.8 10*3/uL (ref 4.0–10.5)

## 2016-10-06 LAB — GLUCOSE, CAPILLARY
GLUCOSE-CAPILLARY: 130 mg/dL — AB (ref 65–99)
GLUCOSE-CAPILLARY: 117 mg/dL — AB (ref 65–99)
GLUCOSE-CAPILLARY: 227 mg/dL — AB (ref 65–99)
Glucose-Capillary: 235 mg/dL — ABNORMAL HIGH (ref 65–99)

## 2016-10-06 MED ORDER — AMLODIPINE BESYLATE 10 MG PO TABS: 10.0000 mg | ORAL_TABLET | Freq: Every day | ORAL | 3 refills | Status: DC

## 2016-10-06 MED ORDER — FUROSEMIDE 80 MG PO TABS
80.0000 mg | ORAL_TABLET | Freq: Two times a day (BID) | ORAL | Status: DC
  Administered 2016-10-06 – 2016-10-07 (×2): 80 mg via ORAL
  Filled 2016-10-06 (×2): qty 1

## 2016-10-06 MED ORDER — ATORVASTATIN CALCIUM 40 MG PO TABS
40.0000 mg | ORAL_TABLET | Freq: Every day | ORAL | Status: DC
  Administered 2016-10-06 – 2016-10-07 (×2): 40 mg via ORAL
  Filled 2016-10-06 (×2): qty 1

## 2016-10-06 MED ORDER — FUROSEMIDE 40 MG PO TABS: 80.0000 mg | ORAL_TABLET | Freq: Two times a day (BID) | ORAL | Status: DC

## 2016-10-06 MED ORDER — ZOLPIDEM TARTRATE 5 MG PO TABS: 5.0000 mg | ORAL_TABLET | Freq: Every evening | ORAL | Status: DC | PRN

## 2016-10-06 MED ORDER — CLONIDINE HCL ER 0.1 MG PO TB12
0.1000 mg | ORAL_TABLET | Freq: Two times a day (BID) | ORAL | Status: DC
  Administered 2016-10-07 – 2016-10-08 (×4): 0.1 mg via ORAL
  Filled 2016-10-06 (×6): qty 1

## 2016-10-06 MED ORDER — INSULIN ASPART 100 UNIT/ML ~~LOC~~ SOLN
0.0000 [IU] | Freq: Every day | SUBCUTANEOUS | Status: DC
  Administered 2016-10-06: 2 [IU] via SUBCUTANEOUS

## 2016-10-06 MED ORDER — CLOPIDOGREL BISULFATE 75 MG PO TABS: 75.0000 mg | ORAL_TABLET | Freq: Every day | ORAL | 0 refills | Status: AC

## 2016-10-06 MED ORDER — FUROSEMIDE 40 MG PO TABS
80.0000 mg | ORAL_TABLET | Freq: Two times a day (BID) | ORAL | Status: DC
  Administered 2016-10-06: 80 mg via ORAL
  Filled 2016-10-06: qty 2

## 2016-10-06 MED ORDER — NITROGLYCERIN 0.4 MG SL SUBL: 0.4000 mg | SUBLINGUAL_TABLET | SUBLINGUAL | Status: DC | PRN

## 2016-10-06 MED ORDER — ONDANSETRON HCL 4 MG/2ML IJ SOLN
4.0000 mg | Freq: Four times a day (QID) | INTRAMUSCULAR | Status: DC | PRN
Start: 2016-10-06 — End: 2016-10-08

## 2016-10-06 MED ORDER — FUROSEMIDE 80 MG PO TABS: 80.0000 mg | ORAL_TABLET | Freq: Two times a day (BID) | ORAL | Status: DC

## 2016-10-06 MED ORDER — ISOSORBIDE MONONITRATE ER 60 MG PO TB24: 60.0000 mg | ORAL_TABLET | Freq: Every day | ORAL | 6 refills | Status: DC

## 2016-10-06 MED ORDER — GLIPIZIDE 10 MG PO TABS: 10.0000 mg | ORAL_TABLET | Freq: Every day | ORAL | 0 refills | Status: DC

## 2016-10-06 MED ORDER — ENOXAPARIN SODIUM 40 MG/0.4ML ~~LOC~~ SOLN
40.0000 mg | SUBCUTANEOUS | Status: DC
  Administered 2016-10-06: 40 mg via SUBCUTANEOUS
  Filled 2016-10-06: qty 0.4

## 2016-10-06 MED ORDER — CLONIDINE HCL ER 0.1 MG PO TB12
0.1000 mg | ORAL_TABLET | Freq: Two times a day (BID) | ORAL | Status: DC
  Administered 2016-10-06: 0.1 mg via ORAL
  Filled 2016-10-06 (×2): qty 1

## 2016-10-06 MED ORDER — CLOPIDOGREL BISULFATE 75 MG PO TABS
75.0000 mg | ORAL_TABLET | Freq: Every day | ORAL | Status: DC
  Administered 2016-10-07 – 2016-10-08 (×2): 75 mg via ORAL
  Filled 2016-10-06 (×2): qty 1

## 2016-10-06 MED ORDER — CHOLECALCIFEROL 10 MCG (400 UNIT) PO TABS
400.0000 [IU] | ORAL_TABLET | Freq: Every day | ORAL | Status: DC
  Administered 2016-10-07 – 2016-10-08 (×2): 400 [IU] via ORAL
  Filled 2016-10-06 (×2): qty 1

## 2016-10-06 MED ORDER — ASPIRIN 81 MG PO CHEW
324.0000 mg | CHEWABLE_TABLET | ORAL | Status: AC
  Administered 2016-10-06: 324 mg via ORAL
  Filled 2016-10-06: qty 4

## 2016-10-06 MED ORDER — AMLODIPINE BESYLATE 10 MG PO TABS
10.0000 mg | ORAL_TABLET | Freq: Every day | ORAL | Status: DC
  Administered 2016-10-07 – 2016-10-08 (×2): 10 mg via ORAL
  Filled 2016-10-06 (×2): qty 1

## 2016-10-06 MED ORDER — ALPRAZOLAM 0.25 MG PO TABS: 0.2500 mg | ORAL_TABLET | Freq: Two times a day (BID) | ORAL | Status: DC | PRN

## 2016-10-06 MED ORDER — ATORVASTATIN CALCIUM 40 MG PO TABS: 40.0000 mg | ORAL_TABLET | Freq: Every day | ORAL | 0 refills | Status: DC

## 2016-10-06 MED ORDER — METOPROLOL TARTRATE 100 MG PO TABS
100.0000 mg | ORAL_TABLET | Freq: Every day | ORAL | Status: DC
  Administered 2016-10-07 – 2016-10-08 (×2): 100 mg via ORAL
  Filled 2016-10-06 (×2): qty 1

## 2016-10-06 MED ORDER — METOPROLOL TARTRATE 100 MG PO TABS: 100.0000 mg | ORAL_TABLET | Freq: Every day | ORAL | 0 refills | Status: DC

## 2016-10-06 MED ORDER — CHOLECALCIFEROL 10 MCG (400 UNIT) PO TABS: 400.0000 [IU] | ORAL_TABLET | Freq: Every day | ORAL | 0 refills | Status: DC

## 2016-10-06 MED ORDER — NITROGLYCERIN 2 % TD OINT
1.0000 [in_us] | TOPICAL_OINTMENT | Freq: Three times a day (TID) | TRANSDERMAL | Status: DC
  Administered 2016-10-06 – 2016-10-07 (×3): 1 [in_us] via TOPICAL
  Filled 2016-10-06: qty 30

## 2016-10-06 MED ORDER — INSULIN ASPART 100 UNIT/ML ~~LOC~~ SOLN
0.0000 [IU] | Freq: Three times a day (TID) | SUBCUTANEOUS | Status: DC
  Administered 2016-10-07 (×2): 2 [IU] via SUBCUTANEOUS
  Administered 2016-10-08: 3 [IU] via SUBCUTANEOUS
  Administered 2016-10-08: 2 [IU] via SUBCUTANEOUS

## 2016-10-06 MED ORDER — ASPIRIN 300 MG RE SUPP
300.0000 mg | RECTAL | Status: AC
  Filled 2016-10-06: qty 1

## 2016-10-06 MED ORDER — CLONIDINE HCL ER 0.1 MG PO TB12: 0.1000 mg | ORAL_TABLET | Freq: Two times a day (BID) | ORAL | Status: DC

## 2016-10-06 MED ORDER — ACETAMINOPHEN 325 MG PO TABS: 650.0000 mg | ORAL_TABLET | ORAL | Status: DC | PRN

## 2016-10-06 MED ORDER — ENOXAPARIN SODIUM 30 MG/0.3ML ~~LOC~~ SOLN
30.0000 mg | SUBCUTANEOUS | Status: DC
  Administered 2016-10-07: 30 mg via SUBCUTANEOUS
  Filled 2016-10-06: qty 0.3

## 2016-10-06 NOTE — Discharge Instructions (Signed)
Inpatient Rehab Discharge Instructions  Reagyn Facemire Discharge date and time: No discharge date for patient encounter.   Activities/Precautions/ Functional Status: Activity: activity as tolerated Diet: diabetic diet Wound Care: none needed Functional status:  ___ No restrictions     ___ Walk up steps independently ___ 24/7 supervision/assistance   ___ Walk up steps with assistance ___ Intermittent supervision/assistance  ___ Bathe/dress independently ___ Walk with walker     _x__ Bathe/dress with assistance ___ Walk Independently    ___ Shower independently ___ Walk with assistance    ___ Shower with assistance ___ No alcohol     ___ Return to work/school ________  Special Instructions: No driving   COMMUNITY REFERRALS UPON DISCHARGE:    Home Health:   [facility]   Date of last service:10/06/2016  Medical Equipment/Items Ordered:HAS ALL EQUIPMENT FROM PREVIOUS ADMITS   GENERAL COMMUNITY RESOURCES FOR PATIENT/FAMILY: Support Groups:[facility] SUPPORT GROUP EVERY SECOND Thursday @ 3:00-4:00 PM ON THE REHAB UNIT QUESTIONS CONTACT [facility] [phone]  STROKE/TIA DISCHARGE INSTRUCTIONS SMOKING Cigarette smoking nearly doubles your risk of having a stroke & is the single most alterable risk factor  If you smoke or have smoked in the last 12 months, you are advised to quit smoking for your health.  Most of the excess cardiovascular risk related to smoking disappears within a year of stopping.  Ask you doctor about anti-smoking medications  [place] Quit Line: 1-800-QUIT NOW  Free Smoking Cessation Classes [phone]  CHOLESTEROL Know your levels; limit fat & cholesterol in your diet  Lipid Panel     Component Value Date/Time   CHOL 167 09/21/2016 0728   TRIG 118 09/21/2016 0728   HDL 42 09/21/2016 0728   CHOLHDL 4.0 09/21/2016 0728   VLDL 24 09/21/2016 0728   LDLCALC 101 (H) 09/21/2016 0728      Many patients benefit from treatment  even if their cholesterol is at goal.  Goal: Total Cholesterol (CHOL) less than 160  Goal:  Triglycerides (TRIG) less than 150  Goal:  HDL greater than 40  Goal:  LDL (LDLCALC) less than 100   BLOOD PRESSURE American Stroke Association blood pressure target is less that 120/80 mm/Hg  Your discharge blood pressure is:  BP: (!) 171/71  Monitor your blood pressure  Limit your salt and alcohol intake  Many individuals will require more than one medication for high blood pressure  DIABETES (A1c is a blood sugar average for last 3 months) Goal HGBA1c is under 7% (HBGA1c is blood sugar average for last 3 months)  Diabetes:   Lab Results  Component Value Date   HGBA1C 7.7 (H) 09/20/2016     Your HGBA1c can be lowered with medications, healthy diet, and exercise.  Check your blood sugar as directed by your physician  Call your physician if you experience unexplained or low blood sugars.  PHYSICAL ACTIVITY/REHABILITATION Goal is 30 minutes at least 4 days per week  Activity: Increase activity slowly, Therapies: Physical Therapy: Home Health Return to work:   Activity decreases your risk of heart attack and stroke and makes your heart stronger.  It helps control your weight and blood pressure; helps you relax and can improve your mood.  Participate in a regular exercise program.  Talk with your doctor about the best form of exercise for you (dancing, walking, swimming, cycling).  DIET/WEIGHT Goal is to maintain a healthy weight  Your discharge diet is: Diet heart healthy/carb modified Room service appropriate? Yes; Fluid consistency:  Thin  liquids Your height is:  Height: 5\' 3"  (160 cm) Your current weight is: Weight: 76.7 kg (169 lb 3.2 oz) Your Body Mass Index (BMI) is:  BMI (Calculated): 29.98  Following the type of diet specifically designed for you will help prevent another stroke.  Your goal weight range is:    Your goal Body Mass Index (BMI) is 19-24.  Healthy food  habits can help reduce 3 risk factors for stroke:  High cholesterol, hypertension, and excess weight.  RESOURCES Stroke/Support Group:  Call [phone]   STROKE EDUCATION PROVIDED/REVIEWED AND GIVEN TO PATIENT Stroke warning signs and symptoms How to activate emergency medical system (call 911). Medications prescribed at discharge. Need for follow-up after discharge. Personal risk factors for stroke. Pneumonia vaccine given:  Flu vaccine given:  My questions have been answered, the writing is legible, and I understand these instructions.  I will adhere to these goals & educational materials that have been provided to me after my discharge from the hospital.      My questions have been answered and I understand these instructions. I will adhere to these goals and the provided educational materials after my discharge from the hospital.  Patient/Caregiver Signature _______________________________ Date __________  Clinician Signature _______________________________________ Date __________  Please bring this form and your medication list with you to all your follow-up doctor's appointments.

## 2016-10-06 NOTE — Progress Notes (Signed)
Patient c/o feeling warm and full in the chest. B/P 207/82 and pulse 102 and bounding. Called PA on call. Order given to give all 0800 B/p meds now and re-check B/P in half an hour.

## 2016-10-06 NOTE — Progress Notes (Signed)
Assess: S/p left body CVA Hypertension, exacerbation-vol related Vol overload CKD  Plan: Will add furosemide PO and clonidine   Subjective: Interval History: Got IVF yesterday. Chest tightness early this AM, improving after sitting up, also had some nausea  Objective: Vital signs in last 24 hours: Temp:  [98.2 F (36.8 C)-98.5 F (36.9 C)] 98.5 F (36.9 C) (09/06 0400) Pulse Rate:  [72-83] 72 (09/06 0600) Resp:  [18-20] 20 (09/06 0400) BP: (132-207)/(54-83) 161/68 (09/06 0600) SpO2:  [100 %] 100 % (09/06 0400) Weight change:   Intake/Output from previous day: 09/05 0701 - 09/06 0700 In: 120 [P.O.:120] Out: -  Intake/Output this shift: Total I/O In: 120 [P.O.:120] Out: 1 [Urine:1]  General appearance: alert and cooperative Neck: supple, symmetrical, trachea midline, thyroid not enlarged, symmetric, no tenderness/mass/nodules and mild JVD +HJR Chest wall: no tenderness Cardio: regular rate and rhythm, S1, S2 normal, no murmur, click, rub or gallop GI: mild tender, RUQ tender Extremities: edema 1+ pitting edema  Lab Results:  Recent Labs  10/03/16 1108  WBC 5.4  HGB 10.0*  HCT 31.3*  PLT 256   BMET:  Recent Labs  10/05/16 0406 10/06/16 0721  NA 134* 138  K 4.3 4.4  CL 106 108  CO2 21* 25  GLUCOSE 99 136*  BUN 54* 51*  CREATININE 2.24* 2.14*  CALCIUM 9.4 10.0   No results for input(s): PTH in the last 72 hours. Iron Studies: No results for input(s): IRON, TIBC, TRANSFERRIN, FERRITIN in the last 72 hours. Studies/Results: No results found.  Scheduled: . amLODipine  10 mg Oral Daily  . atorvastatin  40 mg Oral q1800  . cholecalciferol  400 Units Oral Daily  . cloNIDine HCl  0.1 mg Oral BID  . clopidogrel  75 mg Oral Daily  . furosemide  80 mg Oral BID  . glipiZIDE  10 mg Oral QAC breakfast  . heparin  5,000 Units Subcutaneous Q8H  . insulin aspart  0-9 Units Subcutaneous TID WC  . isosorbide mononitrate  60 mg Oral Daily  . metoprolol tartrate   100 mg Oral Daily    LOS: 14 days   Barrington Worley C 10/06/2016,10:58 AM

## 2016-10-06 NOTE — Progress Notes (Signed)
1045 10/06/16 nursing Son called and expressed concern re: chest tightness this morning . Dan PA notified new orders noted.

## 2016-10-06 NOTE — Progress Notes (Signed)
Patient transferred to [facility] from [facility], VSS, no complaints of pain or other concerns at this time.

## 2016-10-06 NOTE — Progress Notes (Signed)
Social Work Patient ID: Verlon Au, female   DOB: [birth year], 83 y.o.   MRN: [id number]  According to Dan-PA pt not being discharged today due to medical issues.

## 2016-10-06 NOTE — H&P (Signed)
Cardiology History and Physical:   [name]; [id number]; Mar 10, 1927   Admit date: 10/06/2016  Primary Care Provider: Deland Pretty, MD Primary Cardiologist: Dr Percival Spanish 07/15/2016 Primary Electrophysiologist:  n/a   Patient Profile:   [name] is a 83 y.o. female with a hx of MI/CABG 2011, CKD III (Dr Florene Glen), HTN, DM, CVA on 09/19/2016 w/ subacute infarction involving the right posterior limb of internal capsule and lateral thalamus, who is being seen today for the evaluation of chest pain at the request of Dr Ella Bodo.  History of Present Illness:   [name] was admitted for her CVA from 08/20-08/23/2018. She was admitted to rehab on 09/22/2016. Her EF was checked and was normal.   She has been working with rehab and has improved, now requires mod assist, ambulates with a walker. She is planning to live with her son at d/c. She was originally scheduled for discharge today.  Early this am, pt woke with SSCP, pressure, 12/10, she also felt hot. She then got ice water and drank some, but got nauseated. She felt cold then hot again. Her BP was 207/82. She continued to feel chest fullness.   She got her am medications of amlodipine 10 mg, Lopressor 100 mg, Imdur 60 mg at 4 am. The fullness and nausea improved after about an hour.   These symptoms reminded her of her pre-CABG sx.   The fullness has improved and the nausea has resolved, she feels tired. She drank a little water this am but was belching a lot, did not eat anything. She has taken all her am medications not given early. She has not vomited. She is still not hungry, belches occasionally, but is no longer nauseated. Her BP has improved.   She has not had any of these symptoms when working with PT/OT. That is the most strenuous thing she has done recently.  No cath or MV since her CABG.  The fullness is currently a 5/10, it comes and goes. BP is 166/73.       Past Medical History:    Diagnosis Date  . Acute renal injury ([place]) 07/2016  . Anginal pain ([place]) 2004; 2011  . CAD (coronary artery disease)    status post stenting of the RCA status post CABG. (left internal mammary artery to  LAD, saphenous vein graft to second diagonal, saphenous vein  graft to obtuse marginal 1, saphenous vein graft to posterior  descending).  . Cardiomyopathy     Mildly reduced EF (45%).   . Chronic kidney disease (CKD), stage III (moderate)   . History of blood transfusion 1960s   "related to anemia"  . HTN (hypertension)   . Myocardial infarction ([place]) 2011  . Pleural effusion 2011   S/P "bypass"  . Shortness of breath   . Stroke ([place]) 09/19/2016   "left sided weakness" (09/20/2016)  . Type II diabetes mellitus ([place])          Past Surgical History:  Procedure Laterality Date  . CATARACT EXTRACTION W/ INTRAOCULAR LENS  IMPLANT, BILATERAL Bilateral   . CORONARY ANGIOPLASTY WITH STENT PLACEMENT  2004   "LAD & one of the circumflex"  . CORONARY ARTERY BYPASS GRAFT  2011   CABG X4     Inpatient Medications: Scheduled Meds: . amLODipine  10 mg Oral Daily  . atorvastatin  40 mg Oral q1800  . cholecalciferol  400 Units Oral Daily  . cloNIDine HCl  0.1 mg Oral BID  . clopidogrel  75 mg  Oral Daily  . furosemide  80 mg Oral BID  . glipiZIDE  10 mg Oral QAC breakfast  . heparin  5,000 Units Subcutaneous Q8H  . insulin aspart  0-9 Units Subcutaneous TID WC  . isosorbide mononitrate  60 mg Oral Daily  . metoprolol tartrate  100 mg Oral Daily   Continuous Infusions:  PRN Meds: acetaminophen **OR** acetaminophen (TYLENOL) oral liquid 160 mg/5 mL **OR** acetaminophen, ondansetron **OR** ondansetron (ZOFRAN) IV, sorbitol        Prior to Admission medications   Medication Sig Start Date End Date Taking? Authorizing Provider  amLODipine (NORVASC) 10 MG tablet Take 1 tablet (10 mg total) by mouth daily. 10/06/16   Angiulli, Lavon Paganini, PA-C  aspirin 81 MG tablet  Take 81 mg by mouth daily.     [provider]  atorvastatin (LIPITOR) 40 MG tablet Take 1 tablet (40 mg total) by mouth daily. 10/06/16 10/06/17  Angiulli, Lavon Paganini, PA-C  cholecalciferol (VITAMIN D) 400 units TABS tablet Take 1 tablet (400 Units total) by mouth daily. 10/06/16   Angiulli, Lavon Paganini, PA-C  clopidogrel (PLAVIX) 75 MG tablet Take 1 tablet (75 mg total) by mouth daily. 10/06/16 10/06/17  Angiulli, Lavon Paganini, PA-C  glipiZIDE (GLUCOTROL XL) 5 MG 24 hr tablet Take 5 mg by mouth daily.    [provider]  glipiZIDE (GLUCOTROL) 10 MG tablet Take 1 tablet (10 mg total) by mouth daily before breakfast. 10/06/16   Angiulli, Lavon Paganini, PA-C  isosorbide mononitrate (IMDUR) 60 MG 24 hr tablet Take 1 tablet (60 mg total) by mouth daily. 10/06/16   Angiulli, Lavon Paganini, PA-C  losartan (COZAAR) 50 MG tablet Take 1 tablet (50 mg total) by mouth 2 (two) times daily. 11/23/15   Minus Breeding, MD  metoprolol (LOPRESSOR) 50 MG tablet TAKE ONE TABLET BY MOUTH TWICE DAILY Patient taking differently: TAKE ONE TABLET BY MOUTH DAILY 02/22/16   Minus Breeding, MD  metoprolol tartrate (LOPRESSOR) 100 MG tablet Take 1 tablet (100 mg total) by mouth daily. 10/06/16   Angiulli, Lavon Paganini, PA-C    Allergies:    Allergies  Allergen Reactions  . Lisinopril Other (See Comments)    Patient states that she took lisinopril along with other medications when hospitalized and had a reaction and lisinopril was determined to be the cause, but has taken lisinopril since her initial reaction and has had no further allergic reactions.    Social History:   Social History        Social History  . Marital status: Married    Spouse name: N/A  . Number of children: 2  . Years of education: N/A       Occupational History  . Retired        Social History Main Topics  . Smoking status: Never Smoker  . Smokeless tobacco: Never Used  . Alcohol use No  . Drug use: No  . Sexual activity:  Not on file       Other Topics Concern  . Not on file      Social History Narrative  . No narrative on file    Family History:   [name] family history includes Diabetes (age of onset: 77) in her father; Unexplained death (age of onset: 2) in her mother. There is no history of CAD. Pt indicated that her mother is deceased. She indicated that her father is deceased. She indicated that the status of her neg hx is unknown.    ROS:  Please see the history of present illness.  All other ROS reviewed and negative.      Physical Exam/Data:         Vitals:   10/05/16 1500 10/06/16 0400 10/06/16 0435 10/06/16 0600  BP: (!) 132/54 (!) 207/82 (!) 192/83 (!) 161/68  Pulse: 83 83  72  Resp: 18 20    Temp: 98.2 F (36.8 C) 98.5 F (36.9 C)    TempSrc: Oral Oral    SpO2: 100% 100%    Weight:      Height:        Intake/Output Summary (Last 24 hours) at 10/06/16 1145 Last data filed at 10/06/16 0838  Gross per 24 hour  Intake              120 ml  Output                1 ml  Net              119 ml        Filed Weights   10/01/16 0500 10/02/16 0500 10/04/16 0500  Weight: 166 lb (75.3 kg) 166 lb (75.3 kg) 168 lb (76.2 kg)   Body mass index is 29.76 kg/m.  General:  Well nourished, well developed, in no acute distress HEENT: normal Lymph: no adenopathy Neck: no JVD Endocrine:  No thryomegaly Vascular: No carotid bruits; 4/4 extremity pulses 2+ bilaterally  Cardiac:  normal S1, S2; RRR; very soft murmur  Lungs:  clear to auscultation bilaterally, no wheezing, rhonchi or rales  Abd: soft, nontender, no hepatomegaly  Ext: no edema Musculoskeletal:  No deformities, BUE and BLE strength weak but equal Skin: warm and dry  Neuro:  CNs 2-12 intact, no focal abnormalities noted Psych:  Normal affect   EKG:  The EKG was personally reviewed and demonstrates:  SR, HR 73, T waves are different from 08/21 ECG in V3, unclear clinical  significance. Telemetry: not on  Relevant CV Studies: ECHO: 09/21/2016 - Left ventricle: The cavity size was normal. Wall thickness was increased in a pattern of mild LVH. Systolic function was normal. The estimated ejection fraction was in the range of 55% to 60%. Wall motion was normal; there were no regional wall motion abnormalities. Doppler parameters are consistent with abnormal left ventricular relaxation (grade 1 diastolic dysfunction). - Mitral valve: There was mild regurgitation. - Left atrium: The atrium was mildly dilated. - Atrial septum: No defect or patent foramen ovale was identified. Impressions: - No cardiac source of emboli was indentified.  Laboratory Data:  Chemistry  Recent Labs Lab 10/03/16 1108 10/05/16 0406 10/06/16 0721  NA 135 134* 138  K 4.5 4.3 4.4  CL 106 106 108  CO2 21* 21* 25  GLUCOSE 168* 99 136*  BUN 59* 54* 51*  CREATININE 2.34* 2.24* 2.14*  CALCIUM 10.0 9.4 10.0  GFRNONAA 18* 19* 20*  GFRAA 20* 22* 23*  ANIONGAP 8 7 5          Lab Results  Component Value Date   ALT 14 09/23/2016   AST 17 09/23/2016   ALKPHOS 57 09/23/2016   BILITOT 0.6 09/23/2016         Lab Results  Component Value Date   CHOL 167 09/21/2016   HDL 42 09/21/2016   LDLCALC 101 (H) 09/21/2016   TRIG 118 09/21/2016   CHOLHDL 4.0 09/21/2016    Hematology  Recent Labs Lab 10/03/16 1108  WBC 5.4  RBC 3.51*  HGB 10.0*  HCT  31.3*  MCV 89.2  MCH 28.5  MCHC 31.9  RDW 14.2  PLT 256   Cardiac Enzymes        Troponin i, poc  Date Value Ref Range Status  09/20/2016 0.00 0.00 - 0.08 ng/mL Final         Lab Results  Component Value Date   HGBA1C 7.7 (H) 09/20/2016    Radiology/Studies:  Dg Chest 2 View  Result Date: 10/06/2016 CLINICAL DATA:  Chest pain and chills EXAM: CHEST  2 VIEW COMPARISON:  May 14, 2010 FINDINGS: There is mild scarring in the left base. There is no edema or consolidation. Heart is  upper normal in size with pulmonary vascularity within normal limits. No adenopathy. Patient is status post coronary artery bypass grafting. There is aortic atherosclerosis. Note that there is no longer elevation of the left hemidiaphragm. There is remodeling from an old healed fracture of the proximal right humerus. IMPRESSION: Mild left base atelectasis. No edema or consolidation. Heart upper normal in size. There is aortic atherosclerosis. Patient is status post coronary artery bypass grafting. Aortic Atherosclerosis (ICD10-I70.0). Electronically Signed   By: Lowella Grip III M.D.   On: 10/06/2016 11:20    Assessment and Plan:   1. Chest pain - in the setting of HTN, BP sig higher than usual for her. - ECG w/ no acute changes and sx resolved after administration of her BP meds - however, sx remind pt of her pre-CABG sx - her BUN/Cr are high, she has been followed by Dr Florene Glen for this - she really needs tx to telemetry  Otherwise, continue current rx. Principal Problem:   Hemiparesis affecting left side as late effect of stroke [facility]) Active Problems:   Essential hypertension   Diabetes mellitus with complication (HCC)   Small vessel disease   Gait disturbance, post-stroke   Stage 3 chronic kidney disease   AKI (acute kidney injury) ([place])   Acute blood loss anemia   Labile blood pressure   Signed, Barrett, Rhonda, PA-C  10/06/2016 11:45 AM  History and all data above reviewed.  Patient examined.  I agree with the findings as above. The patient has been in rehab after a stroke.  She had not been having any recent cardiac complaints.  However, last night she had chest fullness and nausea.  She was noted to be hypertensive.  She had the same discomfort this morning as well.  She denies neck or arm pain.  She is not having SOB.  Her BP this morning remains somewhat elevated.  There are no acute EKG changes.  She actually has recovered nicely from her CVA and was walking with a  walker.    The patient exam reveals COR:RRR  ,  Lungs: Clear  ,  Abd: Positive bowel sounds, no rebound no guarding, Ext No edema  .  All available labs, radiology testing, previous records reviewed. Agree with documented assessment and plan. Chest pain:  Somewhat atypical but similar to previous angina.  Given this we will rule her out and observe her on telemetry.  I will increase her Imdur.  I am aware that we would prefer some higher BPs post stroke but she is now probably beyond the point where this is critical.  I will try for normotension.  If enzymes are negative and she has no further symptoms I will manage conservatively given her higher risk related to her age and renal function.    Tyrees Chopin  1:21 PM  10/06/2016

## 2016-10-06 NOTE — Progress Notes (Signed)
Social Work  Discharge Note  The overall goal for the admission was met for:   Discharge location: NO-GOING TO ACUTE FOR TELEMETRY BED-MEDICAL ISSUES  Length of Stay: Yes-14 DAYS  Discharge activity level: Yes-MOD/I [place]  Home/community participation: Yes  Services provided included: MD, RD, PT, OT, RN, CM, TR, Pharmacy and SW  Financial Services: Private Insurance: [facility]  Follow-up services arranged: Home Health: [facility] and Patient/Family has no preference for HH/DME agencies  Comments (or additional information):PT DID VERY WELL AND REACHED MOD/I LEVEL, READY TO [place].  Patient/Family verbalized understanding of follow-up arrangements: Yes  Individual responsible for coordination of the follow-up plan: SELF & LYNN-DAUGHTER  Confirmed correct DME delivered: [name] 10/06/2016    [name]

## 2016-10-06 NOTE — Consult Note (Signed)
Cardiology Consultation:   [name]; [id number]; [birth year]   Admit date: 09/22/2016 Date of Consult: 10/06/2016  Primary Care Provider: Deland Pretty, MD Primary Cardiologist: Dr [name] 07/15/2016 Primary Electrophysiologist:  n/a   Patient Profile:   [name] is a 83 y.o. female with a hx of MI/CABG 2011, CKD III (Dr [name]), HTN, DM, CVA on 09/19/2016 w/ subacute infarction involving the right posterior limb of internal capsule and lateral thalamus, who is being seen today for the evaluation of chest pain at the request of Dr [name].  History of Present Illness:   [name] was admitted for her CVA from 08/20-08/23/2018. She was admitted to rehab on 09/22/2016. Her EF was checked and was normal.   She has been working with rehab and has improved, now requires mod assist, ambulates with a walker. She is planning to live with her son at d/c. She was originally scheduled for discharge today.  Early this am, pt woke with SSCP, pressure, 12/10, she also felt hot. She then got ice water and drank some, but got nauseated. She felt cold then hot again. Her BP was 207/82. She continued to feel chest fullness.   She got her am medications of amlodipine 10 mg, Lopressor 100 mg, Imdur 60 mg at 4 am. The fullness and nausea improved after about an hour.   These symptoms reminded her of her pre-CABG sx.   The fullness has improved and the nausea has resolved, she feels tired. She drank a little water this am but was belching a lot, did not eat anything. She has taken all her am medications not given early. She has not vomited. She is still not hungry, belches occasionally, but is no longer nauseated. Her BP has improved.   She has not had any of these symptoms when working with PT/OT. That is the most strenuous thing she has done recently.  No cath or MV since her CABG.  The fullness is currently a 5/10, it comes and goes. BP is 166/73.   Past Medical History:    Diagnosis Date  . Acute renal injury ([name]) 07/2016  . Anginal pain ([name]) 2004; 2011  . CAD (coronary artery disease)    status post stenting of the RCA status post CABG. (left internal mammary artery to  Darlene, saphenous vein graft to second diagonal, saphenous vein  graft to obtuse marginal 1, saphenous vein graft to posterior  descending).  . Cardiomyopathy     Mildly reduced EF (45%).   . Chronic kidney disease (CKD), stage III (moderate)   . History of blood transfusion 1960s   "related to anemia"  . HTN (hypertension)   . Myocardial infarction ([name]) 2011  . Pleural effusion 2011   S/P "bypass"  . Shortness of breath   . Stroke ([name]) 09/19/2016   "left sided weakness" (09/20/2016)  . Type II diabetes mellitus ([name])     Past Surgical History:  Procedure Laterality Date  . CATARACT EXTRACTION W/ INTRAOCULAR LENS  IMPLANT, BILATERAL Bilateral   . CORONARY ANGIOPLASTY WITH STENT PLACEMENT  2004   "[name]"  . CORONARY ARTERY BYPASS GRAFT  2011   CABG X4     Inpatient Medications: Scheduled Meds: . amLODipine  10 mg Oral Daily  . atorvastatin  40 mg Oral q1800  . cholecalciferol  400 Units Oral Daily  . cloNIDine HCl  0.1 mg Oral BID  . clopidogrel  75 mg Oral Daily  . furosemide  80  mg Oral BID  . glipiZIDE  10 mg Oral QAC breakfast  . heparin  5,000 Units Subcutaneous Q8H  . insulin aspart  0-9 Units Subcutaneous TID WC  . isosorbide mononitrate  60 mg Oral Daily  . metoprolol tartrate  100 mg Oral Daily   Continuous Infusions:  PRN Meds: acetaminophen **OR** acetaminophen (TYLENOL) oral liquid 160 mg/5 mL **OR** acetaminophen, ondansetron **OR** ondansetron (ZOFRAN) IV, sorbitol Prior to Admission medications   Medication Sig Start Date End Date Taking? Authorizing Provider  amLODipine (NORVASC) 10 MG tablet Take 1 tablet (10 mg total) by mouth daily. 10/06/16   Angiulli, Lavon Paganini, PA-C  aspirin 81 MG tablet Take 81 mg by mouth daily.     [provider]  atorvastatin (LIPITOR) 40 MG tablet Take 1 tablet (40 mg total) by mouth daily. 10/06/16 10/06/17  Angiulli, Lavon Paganini, PA-C  cholecalciferol (VITAMIN D) 400 units TABS tablet Take 1 tablet (400 Units total) by mouth daily. 10/06/16   Angiulli, Lavon Paganini, PA-C  clopidogrel (PLAVIX) 75 MG tablet Take 1 tablet (75 mg total) by mouth daily. 10/06/16 10/06/17  Angiulli, Lavon Paganini, PA-C  glipiZIDE (GLUCOTROL XL) 5 MG 24 hr tablet Take 5 mg by mouth daily.    [provider]  glipiZIDE (GLUCOTROL) 10 MG tablet Take 1 tablet (10 mg total) by mouth daily before breakfast. 10/06/16   Angiulli, Lavon Paganini, PA-C  isosorbide mononitrate (IMDUR) 60 MG 24 hr tablet Take 1 tablet (60 mg total) by mouth daily. 10/06/16   Angiulli, Lavon Paganini, PA-C  losartan (COZAAR) 50 MG tablet Take 1 tablet (50 mg total) by mouth 2 (two) times daily. 11/23/15   Minus Breeding, MD  metoprolol (LOPRESSOR) 50 MG tablet TAKE ONE TABLET BY MOUTH TWICE DAILY Patient taking differently: TAKE ONE TABLET BY MOUTH DAILY 02/22/16   Minus Breeding, MD  metoprolol tartrate (LOPRESSOR) 100 MG tablet Take 1 tablet (100 mg total) by mouth daily. 10/06/16   Angiulli, Lavon Paganini, PA-C    Allergies:    Allergies  Allergen Reactions  . Lisinopril Other (See Comments)    Patient states that she took lisinopril along with other medications when hospitalized and had a reaction and lisinopril was determined to be the cause, but has taken lisinopril since her initial reaction and has had no further allergic reactions.    Social History:   Social History   Social History  . Marital status: Married    Spouse name: N/A  . Number of children: 2  . Years of education: N/A   Occupational History  . Retired    Social History Main Topics  . Smoking status: Never Smoker  . Smokeless tobacco: Never Used  . Alcohol use No  . Drug use: No  . Sexual activity: Not on file   Other Topics Concern  . Not on file   Social History Narrative  . No  narrative on file    Family History:   [name] family history includes Diabetes (age of onset: 77) in her father; Unexplained death (age of onset: 48) in her mother. There is no history of CAD. Pt indicated that her mother is deceased. She indicated that her father is deceased. She indicated that the status of her neg hx is unknown.    ROS:  Please see the history of present illness.  All other ROS reviewed and negative.      Physical Exam/Data:   Vitals:   10/05/16 1500 10/06/16 0400 10/06/16 0435 10/06/16 0600  BP: (!) 132/54 (!) 207/82 (!) 192/83 (!) 161/68  Pulse: 83 83  72  Resp: 18 20    Temp: 98.2 F (36.8 C) 98.5 F (36.9 C)    TempSrc: Oral Oral    SpO2: 100% 100%    Weight:      Height:        Intake/Output Summary (Last 24 hours) at 10/06/16 1145 Last data filed at 10/06/16 0838  Gross per 24 hour  Intake              120 ml  Output                1 ml  Net              119 ml   Filed Weights   10/01/16 0500 10/02/16 0500 10/04/16 0500  Weight: 166 lb (75.3 kg) 166 lb (75.3 kg) 168 lb (76.2 kg)   Body mass index is 29.76 kg/m.  General:  Well nourished, well developed, in no acute distress HEENT: normal Lymph: no adenopathy Neck: no JVD Endocrine:  No thryomegaly Vascular: No carotid bruits; 4/4 extremity pulses 2+ bilaterally  Cardiac:  normal S1, S2; RRR; very soft murmur  Lungs:  clear to auscultation bilaterally, no wheezing, rhonchi or rales  Abd: soft, nontender, no hepatomegaly  Ext: no edema Musculoskeletal:  No deformities, BUE and BLE strength weak but equal Skin: warm and dry  Neuro:  CNs 2-12 intact, no focal abnormalities noted Psych:  Normal affect   EKG:  The EKG was personally reviewed and demonstrates:  SR, HR 73, T waves are different from 08/21 ECG in V3, unclear clinical significance. Telemetry: not on  Relevant CV Studies: ECHO: 09/21/2016 - Left ventricle: The cavity size was normal. Wall thickness was   increased in  a pattern of mild LVH. Systolic function was normal.   The estimated ejection fraction was in the range of 55% to 60%.   Wall motion was normal; there were no regional wall motion   abnormalities. Doppler parameters are consistent with abnormal   left ventricular relaxation (grade 1 diastolic dysfunction). - Mitral valve: There was mild regurgitation. - Left atrium: The atrium was mildly dilated. - Atrial septum: No defect or patent foramen ovale was identified. Impressions: - No cardiac source of emboli was indentified.  Laboratory Data:  Chemistry  Recent Labs Lab 10/03/16 1108 10/05/16 0406 10/06/16 0721  NA 135 134* 138  K 4.5 4.3 4.4  CL 106 106 108  CO2 21* 21* 25  GLUCOSE 168* 99 136*  BUN 59* 54* 51*  CREATININE 2.34* 2.24* 2.14*  CALCIUM 10.0 9.4 10.0  GFRNONAA 18* 19* 20*  GFRAA 20* 22* 23*  ANIONGAP 8 7 5     Lab Results  Component Value Date   ALT 14 09/23/2016   AST 17 09/23/2016   ALKPHOS 57 09/23/2016   BILITOT 0.6 09/23/2016    Lab Results  Component Value Date   CHOL 167 09/21/2016   HDL 42 09/21/2016   LDLCALC 101 (H) 09/21/2016   TRIG 118 09/21/2016   CHOLHDL 4.0 09/21/2016    Hematology  Recent Labs Lab 10/03/16 1108  WBC 5.4  RBC 3.51*  HGB 10.0*  HCT 31.3*  MCV 89.2  MCH 28.5  MCHC 31.9  RDW 14.2  PLT 256   Cardiac Enzymes   Troponin i, poc  Date Value Ref Range Status  09/20/2016 0.00 0.00 - 0.08 ng/mL Final    Lab Results  Component  Value Date   HGBA1C 7.7 (H) 09/20/2016    Radiology/Studies:  Dg Chest 2 View  Result Date: 10/06/2016 CLINICAL DATA:  Chest pain and chills EXAM: CHEST  2 VIEW COMPARISON:  May 14, 2010 FINDINGS: There is mild scarring in the left base. There is no edema or consolidation. Heart is upper normal in size with pulmonary vascularity within normal limits. No adenopathy. Patient is status post coronary artery bypass grafting. There is aortic atherosclerosis. Note that there is no longer  elevation of the left hemidiaphragm. There is remodeling from an old healed fracture of the proximal right humerus. IMPRESSION: Mild left base atelectasis. No edema or consolidation. Heart upper normal in size. There is aortic atherosclerosis. Patient is status post coronary artery bypass grafting. Aortic Atherosclerosis (ICD10-I70.0). Electronically Signed   By: Lowella Grip III M.D.   On: 10/06/2016 11:20    Assessment and Plan:   1. Chest pain - in the setting of HTN, BP sig higher than usual for her. - ECG w/ no acute changes and sx resolved after administration of her BP meds - however, sx remind pt of her pre-CABG sx - her BUN/Cr are high, she has been followed by Dr [name] for this - she really needs tx to telemetry  Otherwise, continue current rx. Principal Problem:   Hemiparesis affecting left side as late effect of stroke [facility]) Active Problems:   Essential hypertension   Diabetes mellitus with complication (HCC)   Small vessel disease   Gait disturbance, post-stroke   Stage 3 chronic kidney disease   AKI (acute kidney injury) ([place])   Acute blood loss anemia   Labile blood pressure   Signed, Rosaria Ferries, PA-C  10/06/2016 11:45 AM

## 2016-10-06 NOTE — Progress Notes (Signed)
10/06/16 1617 nursing Patient transferred to telemetry floor per order. Patient transferred per wheelchair accompanied by NT. Patient alert and oriented no complaints as of this time. Vital signs stable. Report called to receiving RN. Son called but no answer, RN left voice mail to call back.

## 2016-10-06 NOTE — Progress Notes (Addendum)
Subjective/Complaints: Episode of chest tightness x 1 with elevated BP that resolved after BP meds were given early  ROS: Denies CP, SOB, N/V/D currently   Objective: Vital Signs: Blood pressure (!) 161/68, pulse 72, temperature 98.5 F (36.9 C), temperature source Oral, resp. rate 20, height 5' 3"  (1.6 m), weight 76.2 kg (168 lb), SpO2 100 %. No results found. Results for orders placed or performed during the hospital encounter of 09/22/16 (from the past 72 hour(s))  Basic metabolic panel     Status: Abnormal   Collection Time: 10/03/16 11:08 AM  Result Value Ref Range   Sodium 135 135 - 145 mmol/L   Potassium 4.5 3.5 - 5.1 mmol/L   Chloride 106 101 - 111 mmol/L   CO2 21 (L) 22 - 32 mmol/L   Glucose, Bld 168 (H) 65 - 99 mg/dL   BUN 59 (H) 6 - 20 mg/dL   Creatinine, Ser 2.34 (H) 0.44 - 1.00 mg/dL   Calcium 10.0 8.9 - 10.3 mg/dL   GFR calc non Af Amer 18 (L) >60 mL/min   GFR calc Af Amer 20 (L) >60 mL/min    Comment: (NOTE) The eGFR has been calculated using the CKD EPI equation. This calculation has not been validated in all clinical situations. eGFR's persistently <60 mL/min signify possible Chronic Kidney Disease.    Anion gap 8 5 - 15  CBC with Differential/Platelet     Status: Abnormal   Collection Time: 10/03/16 11:08 AM  Result Value Ref Range   WBC 5.4 4.0 - 10.5 K/uL   RBC 3.51 (L) 3.87 - 5.11 MIL/uL   Hemoglobin 10.0 (L) 12.0 - 15.0 g/dL   HCT 31.3 (L) 36.0 - 46.0 %   MCV 89.2 78.0 - 100.0 fL   MCH 28.5 26.0 - 34.0 pg   MCHC 31.9 30.0 - 36.0 g/dL   RDW 14.2 11.5 - 15.5 %   Platelets 256 150 - 400 K/uL   Neutrophils Relative % 58 %   Neutro Abs 3.2 1.7 - 7.7 K/uL   Lymphocytes Relative 30 %   Lymphs Abs 1.6 0.7 - 4.0 K/uL   Monocytes Relative 8 %   Monocytes Absolute 0.4 0.1 - 1.0 K/uL   Eosinophils Relative 3 %   Eosinophils Absolute 0.2 0.0 - 0.7 K/uL   Basophils Relative 1 %   Basophils Absolute 0.0 0.0 - 0.1 K/uL  Glucose, capillary     Status:  Abnormal   Collection Time: 10/03/16 12:23 PM  Result Value Ref Range   Glucose-Capillary 157 (H) 65 - 99 mg/dL  Glucose, capillary     Status: Abnormal   Collection Time: 10/03/16  4:38 PM  Result Value Ref Range   Glucose-Capillary 186 (H) 65 - 99 mg/dL  Glucose, capillary     Status: Abnormal   Collection Time: 10/03/16  9:09 PM  Result Value Ref Range   Glucose-Capillary 174 (H) 65 - 99 mg/dL  Glucose, capillary     Status: Abnormal   Collection Time: 10/04/16  6:09 AM  Result Value Ref Range   Glucose-Capillary 117 (H) 65 - 99 mg/dL  Glucose, capillary     Status: Abnormal   Collection Time: 10/04/16 11:24 AM  Result Value Ref Range   Glucose-Capillary 165 (H) 65 - 99 mg/dL  Glucose, capillary     Status: Abnormal   Collection Time: 10/04/16  4:43 PM  Result Value Ref Range   Glucose-Capillary 150 (H) 65 - 99 mg/dL  Glucose, capillary  Status: Abnormal   Collection Time: 10/04/16  9:18 PM  Result Value Ref Range   Glucose-Capillary 132 (H) 65 - 99 mg/dL  Basic metabolic panel     Status: Abnormal   Collection Time: 10/05/16  4:06 AM  Result Value Ref Range   Sodium 134 (L) 135 - 145 mmol/L   Potassium 4.3 3.5 - 5.1 mmol/L   Chloride 106 101 - 111 mmol/L   CO2 21 (L) 22 - 32 mmol/L   Glucose, Bld 99 65 - 99 mg/dL   BUN 54 (H) 6 - 20 mg/dL   Creatinine, Ser 2.24 (H) 0.44 - 1.00 mg/dL   Calcium 9.4 8.9 - 10.3 mg/dL   GFR calc non Af Amer 19 (L) >60 mL/min   GFR calc Af Amer 22 (L) >60 mL/min    Comment: (NOTE) The eGFR has been calculated using the CKD EPI equation. This calculation has not been validated in all clinical situations. eGFR's persistently <60 mL/min signify possible Chronic Kidney Disease.    Anion gap 7 5 - 15  Glucose, capillary     Status: Abnormal   Collection Time: 10/05/16  6:50 AM  Result Value Ref Range   Glucose-Capillary 103 (H) 65 - 99 mg/dL  Glucose, capillary     Status: Abnormal   Collection Time: 10/05/16 11:56 AM  Result Value  Ref Range   Glucose-Capillary 116 (H) 65 - 99 mg/dL  Glucose, capillary     Status: Abnormal   Collection Time: 10/05/16  4:58 PM  Result Value Ref Range   Glucose-Capillary 176 (H) 65 - 99 mg/dL  Glucose, capillary     Status: Abnormal   Collection Time: 10/05/16  8:56 PM  Result Value Ref Range   Glucose-Capillary 156 (H) 65 - 99 mg/dL  Blood gas, venous     Status: Abnormal   Collection Time: 10/05/16 10:00 PM  Result Value Ref Range   FIO2 21.00    pH, Ven 7.337 7.250 - 7.430   pCO2, Ven 41.0 (L) 44.0 - 60.0 mmHg   pO2, Ven 37.4 32.0 - 45.0 mmHg   Bicarbonate 21.4 20.0 - 28.0 mmol/L   Acid-base deficit 3.5 (H) 0.0 - 2.0 mmol/L   O2 Saturation 65.4 %   Patient temperature 98.6    Drawn by DRAWN BY RN    Sample type VENOUS   Glucose, capillary     Status: Abnormal   Collection Time: 10/06/16  6:38 AM  Result Value Ref Range   Glucose-Capillary 130 (H) 65 - 99 mg/dL     HEENT: normocephalic, Atraumatic Cardio: RRR and no JVD Resp: CTA B/L and unlabored GI: BS positive and ND Skin:   Intact. Warm and dry Neuro: Alert/Oriented Motor 4/5 Left delt bi tri grip (unchanged) LLE: 4/5 HF, KE ADF (stable) RUE/RLE: 5/5 proximal to distal Musc/Skel:  No edema. No tenderness. Gen NAD. Vital signs reviewed Psych: Normal mood and behavior   Assessment/Plan: 1. 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 Physiatrist is providing close team supervision and 24 hour management of active medical problems listed below. Physiatrist and rehab team continue to assess barriers to discharge/monitor patient progress toward functional and medical goals. FIM: Function - Bathing Position: Wheelchair/chair at sink Body parts bathed by patient: Right arm, Left arm, Chest, Abdomen, Front perineal area, Buttocks, Right upper leg, Left upper leg, Right lower leg, Left lower leg Body parts  bathed by helper:  Back Assist Level: Set up Set up : To obtain items  Function- Upper Body Dressing/Undressing What is the patient wearing?: Pull over shirt/dress Pull over shirt/dress - Perfomed by patient: Thread/unthread right sleeve, Put head through opening, Thread/unthread left sleeve, Pull shirt over trunk Pull over shirt/dress - Perfomed by helper: Thread/unthread left sleeve Assist Level: No help, No cues Set up : To obtain clothing/put away Function - Lower Body Dressing/Undressing What is the patient wearing?: Pants, Underwear, Socks, Shoes Position: Other (comment) (straight back chair at the sink) Underwear - Performed by patient: Thread/unthread right underwear leg, Thread/unthread left underwear leg, Pull underwear up/down Underwear - Performed by helper: Thread/unthread left underwear leg Pants- Performed by patient: Thread/unthread left pants leg, Pull pants up/down, Thread/unthread right pants leg Pants- Performed by helper: Pull pants up/down Non-skid slipper socks- Performed by patient: Don/doff left sock Non-skid slipper socks- Performed by helper: Don/doff right sock Socks - Performed by patient: Don/doff left sock, Don/doff right sock Shoes - Performed by patient: Don/doff right shoe, Don/doff left shoe, Fasten right, Fasten left TED Hose - Performed by helper: Don/doff right TED hose, Don/doff left TED hose Assist for footwear: Independent Assist for lower body dressing: More than reasonable time  Function - Toileting Toileting steps completed by patient: Adjust clothing prior to toileting, Performs perineal hygiene, Adjust clothing after toileting Toileting steps completed by helper: Adjust clothing prior to toileting Toileting Assistive Devices: Grab bar or rail Assist level: No help/no cues  Function - Air cabin crew transfer assistive device: Elevated toilet seat/BSC over toilet Assist level to toilet: No Help, no cues, assistive device, takes  more than a reasonable amount of time Assist level from toilet: No Help, no cues, assistive device, takes more than a reasonable amount of time Assist level to bedside commode (at bedside): Moderate assist (Pt 50 - 74%/lift or lower) (per Lynda Rainwater, NT) Assist level from bedside commode (at bedside): Moderate assist (Pt 50 - 74%/lift or lower)  Function - Chair/bed transfer Chair/bed transfer method: Stand pivot Chair/bed transfer assist level: No Help, no cues, assistive device, takes more than a reasonable amount of time Chair/bed transfer assistive device: Armrests, Walker Chair/bed transfer details: Verbal cues for precautions/safety, Verbal cues for technique  Function - Locomotion: Wheelchair Will patient use wheelchair at discharge?: No Type: Manual Max wheelchair distance: 50 Assist Level: Moderate assistance (Pt 50 - 74%) Wheel 50 feet with 2 turns activity did not occur: Safety/medical concerns Assist Level: Moderate assistance (Pt 50 - 74%) Wheel 150 feet activity did not occur: Safety/medical concerns Turns around,maneuvers to table,bed, and toilet,negotiates 3% grade,maneuvers on rugs and over doorsills: No Function - Locomotion: Ambulation Assistive device: Walker-rolling Max distance: 135 Assist level: Supervision or verbal cues Assist level: Supervision or verbal cues Walk 50 feet with 2 turns activity did not occur: Safety/medical concerns Assist level: Supervision or verbal cues Walk 150 feet activity did not occur: Safety/medical concerns Walk 10 feet on uneven surfaces activity did not occur: Safety/medical concerns  Function - Comprehension Comprehension: Auditory Comprehension assist level: Understands complex 90% of the time/cues 10% of the time  Function - Expression Expression: Verbal Expression assist level: Expresses complex 90% of the time/cues < 10% of the time  Function - Social Interaction Social Interaction assist level: Interacts  appropriately with others - No medications needed.  Function - Problem Solving Problem solving assist level: Solves basic problems with no assist  Function - Memory Memory assist level: Complete Independence: No helper Patient normally able to recall (  first 3 days only): Current season, That he or she is in a hospital, Staff names and faces, Location of own room  Medical Problem List and Plan: 1. Left hemiparesissecondary to right PLICinfarct- plavix   Cont CIR PT, OT tent D/C 9/6 2. DVT Prophylaxis/Anticoagulation: Subcutaneous heparin. Monitor platelet counts and any signs of bleeding 3. Pain Management: Tylenol as needed 4. Mood: Provide emotional support 5. Neuropsych: This patient iscapable of making decisions on herown behalf. 6. Skin/Wound Care: Routine skin checks 7. Fluids/Electrolytes/Nutrition: Routine I&O with follow-up chemistries 8.Hypertension. Norvasc 10 mg daily, Imdur 60 mg daily. Monitor with increased mobility,  D/ced cozaar 50 due to Creat elevation   Lopressor 50 mg increased 100 on 8/30, HR in 70s, BP fluctuating Vitals:   10/06/16 0435 10/06/16 0600  BP: (!) 192/83 (!) 161/68  Pulse:  72  Resp:    Temp:    SpO2:     labile 9.Diabetes mellitus with peripheral neuropathy. Hemoglobin A1c 7.7. SSI.   Glucotrol increased to 7. 65m qam 9/1, 167mam 9/4 CBG (last 3)   Recent Labs  10/05/16 1658 10/05/16 2056 10/06/16 0638  GLUCAP 176* 156* 130*    controlled 9/5 10.CAD with CABG. Continue Plavix. No chest pain or shortness of breath 11.CKD stage III with AKI  Creatinine 2.04 on admission. 9/4 2.34, BUN 59, start IVF during the day  D/ced cozaar - has seen Nephro as outpt Dr PoFlorene Glensk for input  12.Hyperlipidemia. Lipitor 13. ANemia  Hb 10.0 on 9/3  Labs pending  Cont to monitor 14.  Hx CHF Ej fx mildly reduced at 45% no signs of overload,  15.  Episode of CP last noc with elevated BP, ?GERD, less likely angina.  Monitor LOS (Days) [place],[name] 10/06/2016, 7:36 AM

## 2016-10-06 NOTE — Progress Notes (Signed)
10/06/16 0858 nursing Orthostatic vital signs called to [facility]. Patient had no complaints as of this time.

## 2016-10-07 ENCOUNTER — Encounter (HOSPITAL_COMMUNITY): Payer: Self-pay | Admitting: *Deleted

## 2016-10-07 DIAGNOSIS — I69354 Hemiplegia and hemiparesis following cerebral infarction affecting left non-dominant side: Secondary | ICD-10-CM | POA: Diagnosis not present

## 2016-10-07 DIAGNOSIS — R079 Chest pain, unspecified: Secondary | ICD-10-CM

## 2016-10-07 DIAGNOSIS — N179 Acute kidney failure, unspecified: Secondary | ICD-10-CM | POA: Diagnosis not present

## 2016-10-07 DIAGNOSIS — I129 Hypertensive chronic kidney disease with stage 1 through stage 4 chronic kidney disease, or unspecified chronic kidney disease: Secondary | ICD-10-CM | POA: Diagnosis not present

## 2016-10-07 DIAGNOSIS — E1122 Type 2 diabetes mellitus with diabetic chronic kidney disease: Secondary | ICD-10-CM | POA: Diagnosis not present

## 2016-10-07 LAB — BASIC METABOLIC PANEL WITH GFR
Anion gap: 9 (ref 5–15)
BUN: 53 mg/dL — ABNORMAL HIGH (ref 6–20)
CO2: 20 mmol/L — ABNORMAL LOW (ref 22–32)
Calcium: 9.7 mg/dL (ref 8.9–10.3)
Chloride: 108 mmol/L (ref 101–111)
Creatinine, Ser: 2.59 mg/dL — ABNORMAL HIGH (ref 0.44–1.00)
GFR calc Af Amer: 18 mL/min — ABNORMAL LOW
GFR calc non Af Amer: 16 mL/min — ABNORMAL LOW
Glucose, Bld: 108 mg/dL — ABNORMAL HIGH (ref 65–99)
Potassium: 4.1 mmol/L (ref 3.5–5.1)
Sodium: 137 mmol/L (ref 135–145)

## 2016-10-07 LAB — GLUCOSE, CAPILLARY
GLUCOSE-CAPILLARY: 93 mg/dL (ref 65–99)
Glucose-Capillary: 138 mg/dL — ABNORMAL HIGH (ref 65–99)
Glucose-Capillary: 147 mg/dL — ABNORMAL HIGH (ref 65–99)
Glucose-Capillary: 147 mg/dL — ABNORMAL HIGH (ref 65–99)

## 2016-10-07 LAB — TROPONIN I
Troponin I: <0.03 ng/mL (ref <0.03)
Troponin I: <0.03 ng/mL

## 2016-10-07 MED ORDER — FUROSEMIDE 80 MG PO TABS
80.0000 mg | ORAL_TABLET | Freq: Every day | ORAL | Status: DC
  Administered 2016-10-08: 40 mg via ORAL
  Filled 2016-10-07: qty 1

## 2016-10-07 MED ORDER — ISOSORBIDE MONONITRATE ER 30 MG PO TB24
30.0000 mg | ORAL_TABLET | Freq: Every day | ORAL | Status: DC
  Administered 2016-10-07 – 2016-10-08 (×2): 30 mg via ORAL
  Filled 2016-10-07 (×2): qty 1

## 2016-10-07 NOTE — Progress Notes (Signed)
Progress Note  Patient Name: [name] Date of Encounter: 10/07/2016  Primary Cardiologist: Dr Percival Spanish  Patient Profile     83 y.o. female w/ hx MI/CABG 2011, CKD III (Dr Florene Glen), HTN, DM, CVA on 09/19/2016 w/ subacute infarction involving the right posterior limb of internal capsule and lateral thalamus, who was admitted from [place] [facility] 09/06 for chest pain.    Subjective   CP has resolved. She is trying to do everything she needs to do to go home. Her daughter is not here yet, pt thinks she is coming sometime Saturday, thinks Sunday is a better day to d/c.   Inpatient Medications    Scheduled Meds: . amLODipine  10 mg Oral Daily  . atorvastatin  40 mg Oral q1800  . cholecalciferol  400 Units Oral Daily  . cloNIDine HCl  0.1 mg Oral BID  . clopidogrel  75 mg Oral Daily  . enoxaparin (LOVENOX) injection  30 mg Subcutaneous Q24H  . furosemide  80 mg Oral BID  . insulin aspart  0-15 Units Subcutaneous TID WC  . insulin aspart  0-5 Units Subcutaneous QHS  . metoprolol tartrate  100 mg Oral Daily  . nitroGLYCERIN  1 inch Topical Q8H   Continuous Infusions:  PRN Meds: acetaminophen, ALPRAZolam, nitroGLYCERIN, ondansetron (ZOFRAN) IV, zolpidem   Vital Signs    Vitals:   10/07/16 0013 10/07/16 0050 10/07/16 0348 10/07/16 0752  BP: (!) 129/58 (!) 121/53 (!) 167/57 (!) 165/62  Pulse: 65 66 70 78  Resp:   20   Temp:  97.7 F (36.5 C) 97.9 F (36.6 C) 98.4 F (36.9 C)  TempSrc:  Oral Oral Oral  SpO2:  99% 100% 100%  Weight:   159 lb 4.8 oz (72.3 kg)   Height:        Intake/Output Summary (Last 24 hours) at 10/07/16 1030 Last data filed at 10/07/16 1006  Gross per 24 hour  Intake              897 ml  Output              750 ml  Net              14 7 ml   Filed Weights   10/06/16 1701 10/07/16 0348  Weight: 160 lb 6 oz (72.7 kg) 159 lb 4.8 oz (72.3 kg)    Telemetry    SR - Personally Reviewed  ECG    09/07 SR, LVG, Lateral T wave inversions seen 09/06  have resolved. - Personally Reviewed  Physical Exam   General: Well developed, well nourished, female appearing in no acute distress. Head: Normocephalic, atraumatic.  Neck: Supple without bruits, JVD not elevated. Lungs:  Resp regular and unlabored, CTA. Heart: RRR, S1, S2, no S3, S4, or murmur; no rub. Abdomen: Soft, non-tender, non-distended with normoactive bowel sounds. No hepatomegaly. No rebound/guarding. No obvious abdominal masses. Extremities: No clubbing, cyanosis, no edema. Distal pedal pulses are 2+ bilaterally. Neuro: Alert and oriented X 3. Moves all extremities spontaneously. Psych: Normal affect.  Labs    Hematology  Recent Labs Lab 10/03/16 1108 10/06/16 1904  WBC 5.4 5.8  RBC 3.51* 3.80*  HGB 10.0* 11.1*  HCT 31.3* 33.8*  MCV 89.2 88.9  MCH 28.5 29.2  MCHC 31.9 32.8  RDW 14.2 14.5  PLT 256 257    Chemistry  Recent Labs Lab 10/05/16 0406 10/06/16 0721 10/06/16 1904 10/07/16 0438  NA 134* 138  --  137  K 4.3 4.4  --  4.1  CL 106 108  --  108  CO2 21* 25  --  20*  GLUCOSE 99 136*  --  108*  BUN 54* 51*  --  53*  CREATININE 2.24* 2.14* 2.46* 2.59*  CALCIUM 9.4 10.0  --  9.7  GFRNONAA 19* 20* 17* 16*  GFRAA 22* 23* 19* 18*  ANIONGAP 7 5  --  9     Cardiac Enzymes Recent Labs Lab 10/06/16 1312 10/06/16 1904 10/07/16 0250 10/07/16 0438  TROPONINI <0.03 <0.03 <0.03 <0.03    Radiology    Dg Chest 2 View  Result Date: 10/06/2016 CLINICAL DATA:  Chest pain and chills EXAM: CHEST  2 VIEW COMPARISON:  May 14, 2010 FINDINGS: There is mild scarring in the left base. There is no edema or consolidation. Heart is upper normal in size with pulmonary vascularity within normal limits. No adenopathy. Patient is status post coronary artery bypass grafting. There is aortic atherosclerosis. Note that there is no longer elevation of the left hemidiaphragm. There is remodeling from an old healed fracture of the proximal right humerus. IMPRESSION: Mild left  base atelectasis. No edema or consolidation. Heart upper normal in size. There is aortic atherosclerosis. Patient is status post coronary artery bypass grafting. Aortic Atherosclerosis (ICD10-I70.0). Electronically Signed   By: Lowella Grip III M.D.   On: 10/06/2016 11:20     Cardiac Studies   None this admit   Patient Profile     83 y.o. female w/ hx MI/CABG 2011, CKD III (Dr Florene Glen), HTN, DM, CVA on 09/19/2016 w/ subacute infarction involving the right posterior limb of internal capsule and lateral thalamus, who was admitted from [facility] 09/06 for chest pain.  Assessment & Plan    Principal Problem: 1.  Chest pain with moderate risk for cardiac etiology - ECG changes have resolved, sx have resolved - ez neg MI - hx CABG 2011, but renal function precludes cath - med rx is best option, will change nitro paste to Imdur 30 mg qd, increase as needed  Active Problems: 2.  Stroke (cerebrum) ([employer]) - pt needs to continue to work on strength and mobility while here - Rehab team has cleared her for d/c   3. CKD III - unclear why pt Cr increasing, BUN is elevated but no sig trend. - will hold pm Lasix, MD to decide on d/c dose  Plan: d/c when medically stable and care plan in place. If pt can go today or tomorrow, she needs to let her daughter and nephew know.   SignedRosaria Ferries , PA-C 10:30 AM 10/07/2016 Pager: [phone]  History and all data above reviewed.  Patient examined.  I agree with the findings as above.   No further chest pain.  Breathing is OK.  She has ambulated to the bathroom.  The patient exam reveals COR:RRR  ,  Lungs: Clear  ,  Abd: Positive bowel sounds, no rebound no guarding, Ext  Trace edema  .  All available labs, radiology testing, previous records reviewed. Agree with documented assessment and plan. CHEST PAIN:  No further pain. Enzymes negative.  No further work up.  Continue medical management.  HTN:   BP is somewhat labile but overall improved.   Continue current meds.  She reports that she will not be able to go home until Sunday when she will have some family at home.      Jeneen Rinks Marijose Curington  2:53 PM  10/07/2016

## 2016-10-07 NOTE — Progress Notes (Signed)
Pt educated on ways to prevent falls at home.

## 2016-10-07 NOTE — Care Management Obs Status (Signed)
[facility] NOTIFICATION   Patient Details  Name: [name] MRN: [id number] Date of Birth: Jun 13, 1927   Medicare Observation Status Notification Given:  Yes (pt refused to sign letter)    Royston Bake, RN 10/07/2016, 3:32 PM

## 2016-10-07 NOTE — Progress Notes (Signed)
Assess: S/p left body CVA Hypertension, exacerbation-vol related Vol overload CKD with exacerbation  Plan: I think we need to cont to optimize BP control for stroke prevention and preservation of renal fct.  Hopefully the worsening is short term and reversible.  I think she needs the diuretic.  Subjective: Interval History: Chest pressure yesterday with el BP  Objective: Vital signs in last 24 hours: Temp:  [97.5 F (36.4 C)-98.4 F (36.9 C)] 98.4 F (36.9 C) (09/07 0752) Pulse Rate:  [65-78] 78 (09/07 0752) Resp:  [16-20] 20 (09/07 0348) BP: (121-167)/(53-64) 165/62 (09/07 0752) SpO2:  [98 %-100 %] 100 % (09/07 0752) Weight:  [72.3 kg (159 lb 4.8 oz)-72.7 kg (160 lb 6 oz)] 72.3 kg (159 lb 4.8 oz) (09/07 0348) Weight change:   Intake/Output from previous day: 09/06 0701 - 09/07 0700 In: -  Out: 600 [Urine:600] Intake/Output this shift: Total I/O In: 1495 [P.O.:1495] Out: 250 [Urine:250]  General appearance: alert and cooperative Resp: clear to auscultation bilaterally Chest wall: no tenderness Cardio: regular rate and rhythm, S1, S2 normal, no murmur, click, rub or gallop  Ext 1+ edema  Lab Results:  Recent Labs  10/06/16 1904  WBC 5.8  HGB 11.1*  HCT 33.8*  PLT 257   BMET:  Recent Labs  10/06/16 0721 10/06/16 1904 10/07/16 0438  NA 138  --  137  K 4.4  --  4.1  CL 108  --  108  CO2 25  --  20*  GLUCOSE 136*  --  108*  BUN 51*  --  53*  CREATININE 2.14* 2.46* 2.59*  CALCIUM 10.0  --  9.7   No results for input(s): PTH in the last 72 hours. Iron Studies: No results for input(s): IRON, TIBC, TRANSFERRIN, FERRITIN in the last 72 hours. Studies/Results: Dg Chest 2 View  Result Date: 10/06/2016 CLINICAL DATA:  Chest pain and chills EXAM: CHEST  2 VIEW COMPARISON:  May 14, 2010 FINDINGS: There is mild scarring in the left base. There is no edema or consolidation. Heart is upper normal in size with pulmonary vascularity within normal limits. No  adenopathy. Patient is status post coronary artery bypass grafting. There is aortic atherosclerosis. Note that there is no longer elevation of the left hemidiaphragm. There is remodeling from an old healed fracture of the proximal right humerus. IMPRESSION: Mild left base atelectasis. No edema or consolidation. Heart upper normal in size. There is aortic atherosclerosis. Patient is status post coronary artery bypass grafting. Aortic Atherosclerosis (ICD10-I70.0). Electronically Signed   By: Lowella Grip III M.D.   On: 10/06/2016 11:20    Scheduled: . amLODipine  10 mg Oral Daily  . atorvastatin  40 mg Oral q1800  . cholecalciferol  400 Units Oral Daily  . cloNIDine HCl  0.1 mg Oral BID  . clopidogrel  75 mg Oral Daily  . enoxaparin (LOVENOX) injection  30 mg Subcutaneous Q24H  . [START ON 10/08/2016] furosemide  80 mg Oral Daily  . insulin aspart  0-15 Units Subcutaneous TID WC  . insulin aspart  0-5 Units Subcutaneous QHS  . isosorbide mononitrate  30 mg Oral Daily  . metoprolol tartrate  100 mg Oral Daily     LOS: 0 days   Johathan Province C 10/07/2016,2:02 PM

## 2016-10-08 ENCOUNTER — Telehealth: Payer: Self-pay | Admitting: Internal Medicine

## 2016-10-08 DIAGNOSIS — I639 Cerebral infarction, unspecified: Secondary | ICD-10-CM

## 2016-10-08 DIAGNOSIS — I25708 Atherosclerosis of coronary artery bypass graft(s), unspecified, with other forms of angina pectoris: Secondary | ICD-10-CM | POA: Diagnosis not present

## 2016-10-08 DIAGNOSIS — I10 Essential (primary) hypertension: Secondary | ICD-10-CM

## 2016-10-08 DIAGNOSIS — I69354 Hemiplegia and hemiparesis following cerebral infarction affecting left non-dominant side: Secondary | ICD-10-CM | POA: Diagnosis not present

## 2016-10-08 DIAGNOSIS — N183 Chronic kidney disease, stage 3 (moderate): Secondary | ICD-10-CM

## 2016-10-08 DIAGNOSIS — R079 Chest pain, unspecified: Secondary | ICD-10-CM | POA: Diagnosis not present

## 2016-10-08 LAB — GLUCOSE, CAPILLARY
Glucose-Capillary: 130 mg/dL — ABNORMAL HIGH (ref 65–99)
Glucose-Capillary: 199 mg/dL — ABNORMAL HIGH (ref 65–99)

## 2016-10-08 LAB — BASIC METABOLIC PANEL
ANION GAP: 10 (ref 5–15)
BUN: 57 mg/dL — ABNORMAL HIGH (ref 6–20)
CALCIUM: 9.6 mg/dL (ref 8.9–10.3)
CO2: 20 mmol/L — ABNORMAL LOW (ref 22–32)
CREATININE: 2.75 mg/dL — AB (ref 0.44–1.00)
Chloride: 103 mmol/L (ref 101–111)
GFR calc Af Amer: 17 mL/min — ABNORMAL LOW (ref >60)
GFR calc non Af Amer: 14 mL/min — ABNORMAL LOW (ref >60)
GLUCOSE: 125 mg/dL — AB (ref 65–99)
Potassium: 4.2 mmol/L (ref 3.5–5.1)
Sodium: 133 mmol/L — ABNORMAL LOW (ref 135–145)

## 2016-10-08 MED ORDER — FUROSEMIDE 40 MG PO TABS: 40.0000 mg | ORAL_TABLET | Freq: Every day | ORAL | Status: DC

## 2016-10-08 MED ORDER — ISOSORBIDE MONONITRATE ER 30 MG PO TB24: 30.0000 mg | ORAL_TABLET | Freq: Every day | ORAL | 5 refills | Status: DC

## 2016-10-08 MED ORDER — NITROGLYCERIN 0.4 MG SL SUBL: 0.4000 mg | SUBLINGUAL_TABLET | SUBLINGUAL | 2 refills | Status: DC | PRN

## 2016-10-08 MED ORDER — FUROSEMIDE 40 MG PO TABS: 40.0000 mg | ORAL_TABLET | Freq: Every day | ORAL | 5 refills | Status: DC

## 2016-10-08 MED ORDER — CLONIDINE HCL ER 0.1 MG PO TB12: 0.1000 mg | ORAL_TABLET | Freq: Two times a day (BID) | ORAL | 3 refills | Status: DC

## 2016-10-08 NOTE — Telephone Encounter (Signed)
Patients son called that she does not have prescription of clonidine. It was called into the pharmacy for 3 refills and 3 months supply.

## 2016-10-08 NOTE — Progress Notes (Addendum)
Progress Note  Patient Name: [name] Date of Encounter: 10/08/2016  Primary Cardiologist: Dr Percival Spanish  Subjective   Denies chest pain and shortness of breath. Ready to go home. Family coming at 1 pm per patient.  Inpatient Medications    Scheduled Meds: . amLODipine  10 mg Oral Daily  . atorvastatin  40 mg Oral q1800  . cholecalciferol  400 Units Oral Daily  . cloNIDine HCl  0.1 mg Oral BID  . clopidogrel  75 mg Oral Daily  . enoxaparin (LOVENOX) injection  30 mg Subcutaneous Q24H  . furosemide  80 mg Oral Daily  . insulin aspart  0-15 Units Subcutaneous TID WC  . insulin aspart  0-5 Units Subcutaneous QHS  . isosorbide mononitrate  30 mg Oral Daily  . metoprolol tartrate  100 mg Oral Daily   Continuous Infusions:  PRN Meds: acetaminophen, ALPRAZolam, nitroGLYCERIN, ondansetron (ZOFRAN) IV, zolpidem   Vital Signs    Vitals:   10/07/16 1425 10/07/16 2000 10/08/16 0030 10/08/16 0604  BP: (!) 112/51 136/66 (!) 132/54 (!) 166/61  Pulse: (!) 59 80 71 88  Resp: 14 18 18 18   Temp: 97.9 F (36.6 C) (!) 97.5 F (36.4 C) 97.8 F (36.6 C) 97.9 F (36.6 C)  TempSrc: Oral Oral Oral Oral  SpO2: 100% 100% 100% 100%  Weight:    156 lb 3.2 oz (70.9 kg)  Height:        Intake/Output Summary (Last 24 hours) at 10/08/16 1007 Last data filed at 10/08/16 0850  Gross per 24 hour  Intake             3238 ml  Output              700 ml  Net             2538 ml   Filed Weights   10/06/16 1701 10/07/16 0348 10/08/16 0604  Weight: 160 lb 6 oz (72.7 kg) 159 lb 4.8 oz (72.3 kg) 156 lb 3.2 oz (70.9 kg)    Telemetry    NSR - Personally Reviewed  ECG    n/a - Personally Reviewed  Physical Exam   GEN: No acute distress.   Neck: No JVD Cardiac: RRR, no murmurs, rubs, or gallops.  Respiratory: Clear to auscultation bilaterally. GI: Soft, nontender, non-distended  MS: No edema; No deformity. Neuro:  Alert and oriented Psych: Normal affect   Labs     Chemistry Recent Labs Lab 10/06/16 0721 10/06/16 1904 10/07/16 0438 10/08/16 0352  NA 138  --  137 133*  K 4.4  --  4.1 4.2  CL 108  --  108 103  CO2 25  --  20* 20*  GLUCOSE 136*  --  108* 125*  BUN 51*  --  53* 57*  CREATININE 2.14* 2.46* 2.59* 2.75*  CALCIUM 10.0  --  9.7 9.6  GFRNONAA 20* 17* 16* 14*  GFRAA 23* 19* 18* 17*  ANIONGAP 5  --  9 10     Hematology Recent Labs Lab 10/03/16 1108 10/06/16 1904  WBC 5.4 5.8  RBC 3.51* 3.80*  HGB 10.0* 11.1*  HCT 31.3* 33.8*  MCV 89.2 88.9  MCH 28.5 29.2  MCHC 31.9 32.8  RDW 14.2 14.5  PLT 256 257    Cardiac Enzymes Recent Labs Lab 10/06/16 1312 10/06/16 1904 10/07/16 0250 10/07/16 0438  TROPONINI <0.03 <0.03 <0.03 <0.03   No results for input(s): TROPIPOC in the last 168 hours.   BNPNo results for  input(s): BNP, PROBNP in the last 168 hours.   DDimer No results for input(s): DDIMER in the last 168 hours.   Radiology    Dg Chest 2 View  Result Date: 10/06/2016 CLINICAL DATA:  Chest pain and chills EXAM: CHEST  2 VIEW COMPARISON:  May 14, 2010 FINDINGS: There is mild scarring in the left base. There is no edema or consolidation. Heart is upper normal in size with pulmonary vascularity within normal limits. No adenopathy. Patient is status post coronary artery bypass grafting. There is aortic atherosclerosis. Note that there is no longer elevation of the left hemidiaphragm. There is remodeling from an old healed fracture of the proximal right humerus. IMPRESSION: Mild left base atelectasis. No edema or consolidation. Heart upper normal in size. There is aortic atherosclerosis. Patient is status post coronary artery bypass grafting. Aortic Atherosclerosis (ICD10-I70.0). Electronically Signed   By: Lowella Grip III M.D.   On: 10/06/2016 11:20    Cardiac Studies   None this admission  Patient Profile     83 y.o. female w/ hx MI/CABG 2011, CKD III (Dr Florene Glen), HTN, DM, CVA on 09/19/2016 w/ subacute  infarction involving the right posterior limb of internal capsule and lateral thalamus, who was admitted from [facility] 09/06 for chest pain.  Assessment & Plan   1.  Chest pain with h/o CABG - ECG changes have resolved, sx have resolved - ez neg MI - hx CABG 2011, but renal function precludes cath - med rx is best option, continue Imdur 30 mg qd, increase as needed  2.  Stroke (cerebrum) ([place]) - pt needs to continue to work on strength and mobility while here - Rehab team has cleared her for d/c   3. CKD III - creatinine is elevated, nephrology has evaluated - As per renal, diuretic required. I will reduce to 40 mg daily. Will need Renal follow up appt with BMET.  4. HTN: Remains elevated. Imdur recently added. Needs close monitoring.   Disposition: Will be discharged today.   For questions or updates, please contact [facility] Please consult [email or web address] for contact info under Cardiology/STEMI. Daytime calls, contact the Day Call APP (6a-8a) or assigned team (Teams A-D) provider (7:30a - 5p). All other daytime calls (7:30-5p), contact the Card Master @ [phone].   Nighttime calls, contact the assigned APP (5p-8p) or MD (6:30p-8p). Overnight calls (8p-6a), contact the on call Fellow @ [phone].      Signed, Kate Sable, MD  10/08/2016, 10:07 AM

## 2016-10-08 NOTE — Progress Notes (Signed)
Assess: S/p left body CVA Hypertension, exacerbation-vol related, improved on more meds. Vol overload CKD with exacerbation  Plan: She is being discharge and I will see her in office in 3-4 weeks.  Subjective: Interval History: Eager to go home  Objective: Vital signs in last 24 hours: Temp:  [97.5 F (36.4 C)-97.9 F (36.6 C)] 97.8 F (36.6 C) (09/08 0930) Pulse Rate:  [59-88] 71 (09/08 0930) Resp:  [14-18] 18 (09/08 0930) BP: (112-166)/(51-66) 132/54 (09/08 0930) SpO2:  [100 %] 100 % (09/08 0930) Weight:  [70.9 kg (156 lb 3.2 oz)] 70.9 kg (156 lb 3.2 oz) (09/08 0604) Weight change: -1.894 kg (-4 lb 2.8 oz)  Intake/Output from previous day: 09/07 0701 - 09/08 0700 In: 4135 [P.O.:4135] Out: 850 [Urine:850] Intake/Output this shift: No intake/output data recorded.  General appearance: alert and cooperative Resp: clear to auscultation bilaterally Chest wall: no tenderness Cardio: regular rate and rhythm, S1, S2 normal, no murmur, click, rub or gallop Extremities: edema tr to 1+  Lab Results:  Recent Labs  10/06/16 1904  WBC 5.8  HGB 11.1*  HCT 33.8*  PLT 257   BMET:  Recent Labs  10/07/16 0438 10/08/16 0352  NA 137 133*  K 4.1 4.2  CL 108 103  CO2 20* 20*  GLUCOSE 108* 125*  BUN 53* 57*  CREATININE 2.59* 2.75*  CALCIUM 9.7 9.6   No results for input(s): PTH in the last 72 hours. Iron Studies: No results for input(s): IRON, TIBC, TRANSFERRIN, FERRITIN in the last 72 hours. Studies/Results: No results found.  Scheduled: . amLODipine  10 mg Oral Daily  . atorvastatin  40 mg Oral q1800  . cholecalciferol  400 Units Oral Daily  . cloNIDine HCl  0.1 mg Oral BID  . clopidogrel  75 mg Oral Daily  . enoxaparin (LOVENOX) injection  30 mg Subcutaneous Q24H  . [START ON 10/09/2016] furosemide  40 mg Oral Daily  . insulin aspart  0-15 Units Subcutaneous TID WC  . insulin aspart  0-5 Units Subcutaneous QHS  . isosorbide mononitrate  30 mg Oral Daily  .  metoprolol tartrate  100 mg Oral Daily    LOS: 0 days   Chrisie Jankovich C 10/08/2016,11:29 AM

## 2016-10-08 NOTE — Progress Notes (Signed)
Patient transported to main entrance of the hospital to be taken home by family.  Patient with DC instructions, belongings and walker in possession at time of discharge

## 2016-10-08 NOTE — Discharge Summary (Signed)
Discharge Summary    [name],  MRN: [id number], DOB/AGE: 81-Jul-1929 81 y.o.  Admit date: 10/06/2016 Discharge date: 10/08/2016  Primary Care Provider: Deland Pretty Primary Cardiologist: Dr. Percival Spanish   Discharge Diagnoses    Principal Problem:   Chest pain with moderate risk for cardiac etiology Active Problems:   Stroke (cerebrum) [facility])   Stage 3 chronic kidney disease   Allergies No Known Allergies  Diagnostic Studies/Procedures    2D Echo 09/21/16  Study Conclusions  - Left ventricle: The cavity size was normal. Wall thickness was   increased in a pattern of mild LVH. Systolic function was normal.   The estimated ejection fraction was in the range of 55% to 60%.   Wall motion was normal; there were no regional wall motion   abnormalities. Doppler parameters are consistent with abnormal   left ventricular relaxation (grade 1 diastolic dysfunction). - Mitral valve: There was mild regurgitation. - Left atrium: The atrium was mildly dilated. - Atrial septum: No defect or patent foramen ovale was identified.  Impressions:  - No cardiac source of emboli was indentified.   History of Present Illness       83 y.o. female w/ hx MI/CABG 2011, CKD III (followed by Dr Florene Glen), HTN, DM, recent CVA on 09/19/2016 w/ subacute infarction involving the right posterior limb of internal capsule and lateral thalamus, who was admitted from inpt rehab on 09/0/18 for chest pain. Pt woke with SSCP, pressure, 10/10, she also felt hot. She then got ice water and drank some, but got nauseated. She felt cold then hot again. Her BP was 207/82. She continued to feel chest fullness. These symptoms reminded her of her pre-CABG sx.   Hospital Course     Pt was admitted. Her chest pain improved after BP meds were given and normalization of BP. ECG w/ no acute changes. Cardiac enzymes were cycled and negative, ruling out MI. No arrhthymias were noted on telemetry. Given her  advanced age and renal function, Dr. Percival Spanish elected to try medical therapy. He added Imdur, 30 mg. Lasix was reduced down to 40 mg daily given renal function. She was monitored and had no recurrent CP and BP remained stable. No further cardiac w/u was recommended. On 10/08/16, she was seen and examined by Dr. Bronson Ing, who determined she was stable for discharge home. The rehab team also cleared her for d/c home. She will f/u with Dr. Percival Spanish or APP on his team in 1-2 weeks. Dr. Bronson Ing has recommended renal follow-up with Dr. Florene Glen with repeat BMP.    Consultants: home    Discharge Vitals Blood pressure (!) 132/54, pulse 71, temperature 97.8 F (36.6 C), temperature source Oral, resp. rate 18, height 5\' 3"  (1.6 m), weight 156 lb 3.2 oz (70.9 kg), SpO2 100 %.  Filed Weights   10/06/16 1701 10/07/16 0348 10/08/16 0604  Weight: 160 lb 6 oz (72.7 kg) 159 lb 4.8 oz (72.3 kg) 156 lb 3.2 oz (70.9 kg)    Labs & Radiologic Studies    CBC  Recent Labs  10/06/16 1904  WBC 5.8  HGB 11.1*  HCT 33.8*  MCV 88.9  PLT 409   Basic Metabolic Panel  Recent Labs  10/07/16 0438 10/08/16 0352  NA 137 133*  K 4.1 4.2  CL 108 103  CO2 20* 20*  GLUCOSE 108* 125*  BUN 53* 57*  CREATININE 2.59* 2.75*  CALCIUM 9.7 9.6   Liver Function Tests No results for input(s): AST,  ALT, ALKPHOS, BILITOT, PROT, ALBUMIN in the last 72 hours. No results for input(s): LIPASE, AMYLASE in the last 72 hours. Cardiac Enzymes  Recent Labs  10/06/16 1904 10/07/16 0250 10/07/16 0438  TROPONINI <0.03 <0.03 <0.03   BNP Invalid input(s): POCBNP D-Dimer No results for input(s): DDIMER in the last 72 hours. Hemoglobin A1C No results for input(s): HGBA1C in the last 72 hours. Fasting Lipid Panel No results for input(s): CHOL, HDL, LDLCALC, TRIG, CHOLHDL, LDLDIRECT in the last 72 hours. Thyroid Function Tests No results for input(s): TSH, T4TOTAL, T3FREE, THYROIDAB in the last 72 hours.  Invalid  input(s): FREET3 _____________  Dg Chest 2 View  Result Date: 10/06/2016 CLINICAL DATA:  Chest pain and chills EXAM: CHEST  2 VIEW COMPARISON:  May 14, 2010 FINDINGS: There is mild scarring in the left base. There is no edema or consolidation. Heart is upper normal in size with pulmonary vascularity within normal limits. No adenopathy. Patient is status post coronary artery bypass grafting. There is aortic atherosclerosis. Note that there is no longer elevation of the left hemidiaphragm. There is remodeling from an old healed fracture of the proximal right humerus. IMPRESSION: Mild left base atelectasis. No edema or consolidation. Heart upper normal in size. There is aortic atherosclerosis. Patient is status post coronary artery bypass grafting. Aortic Atherosclerosis (ICD10-I70.0). Electronically Signed   By: Lowella Grip III M.D.   On: 10/06/2016 11:20   Ct Head Wo Contrast  Result Date: 09/20/2016 CLINICAL DATA:  Left arm and leg weakness since 7 p.m. EXAM: CT HEAD WITHOUT CONTRAST TECHNIQUE: Contiguous axial images were obtained from the base of the skull through the vertex without intravenous contrast. COMPARISON:  05/13/2010 FINDINGS: Brain: No evidence of acute infarction, hemorrhage, extra-axial collection, ventriculomegaly, or mass effect. Generalized cerebral atrophy. Periventricular white matter low attenuation likely secondary to microangiopathy. Vascular: Cerebrovascular atherosclerotic calcifications are noted. Skull: Negative for fracture or focal lesion. Sinuses/Orbits: Visualized portions of the orbits are unremarkable. Visualized portions of the paranasal sinuses and mastoid air cells are unremarkable. Other: None. IMPRESSION: 1. No acute intracranial pathology. Electronically Signed   By: Kathreen Devoid   On: 09/20/2016 13:43   Mr Jodene Nam Neck Wo Contrast  Result Date: 09/20/2016 CLINICAL DATA:  83 y/o F; left arm and left leg weakness with decreased sensation. EXAM: MRI HEAD WITHOUT  CONTRAST MRA HEAD WITHOUT CONTRAST MRA NECK WITHOUT CONTRAST TECHNIQUE: Multiplanar, multiecho pulse sequences of the brain and surrounding structures were obtained without intravenous contrast. Angiographic images of the Circle of Willis were obtained using MRA technique without intravenous contrast. Angiographic images of the neck were obtained using MRA technique without intravenous contrast. Carotid stenosis measurements (when applicable) are obtained utilizing NASCET criteria, using the distal internal carotid diameter as the denominator. COMPARISON:  09/20/2016 CT head FINDINGS: MRI HEAD FINDINGS Brain: 11 x 7 mm focus of reduced diffusion within the right posterior limb of internal capsule and lateral thalamus (series 4, image 24 in series 405, image 24) compatible with acute/ early subacute infarction. The infarction demonstrates T2 FLAIR hyperintense signal abnormality. There is no evidence for hemorrhage or mass effect. Patchy confluent nonspecific foci of T2 FLAIR hyperintense signal abnormality in subcortical and periventricular white matter is compatible with advanced chronic microvascular ischemic changes for age and there is diffuse brain parenchymal atrophy. Small cortical infarction is present in the right posterior frontal cortex and there is a chronic lacunar infarction within the left thalamus. Vascular: As below. Skull and upper cervical spine: Normal marrow signal. Sinuses/Orbits:  No abnormal signal of paranasal sinuses or mastoid air cells. Bilateral intra-ocular lens replacement. Other: None. MRA HEAD FINDINGS Anterior circulation: No large vessel occlusion, aneurysm, or significant stenosis is identified. Mild lumen irregularity of carotid siphons with mild distal left cavernous stenosis is compatible with atherosclerotic changes. Posterior circulation: Codominant vertebrobasilar system. No large vessel occlusion, aneurysm, or significant stenosis is identified. Anatomic variation:  Diminutive posterior communicating arteries and large anterior communicating artery are present. MRA NECK FINDINGS Right carotid system: No aneurysm or hemodynamically significant stenosis by NASCET criteria is identified. Left carotid system: No aneurysm or hemodynamically significant stenosis by NASCET criteria is identified. Vertebrobasilar system: No dissection, aneurysm, or significant stenosis is identified. IMPRESSION: 1. 11 mm acute/early subacute infarction involving right posterior limb of internal capsule and lateral thalamus. No acute hemorrhage or mass effect. 2. Advanced chronic microvascular ischemic changes and parenchymal volume loss of the brain. 3. Patent carotid and vertebral arteries. No aneurysm or hemodynamically significant stenosis utilizing NASCET criteria. 4. Patent circle of Willis. No large vessel occlusion, aneurysm, or significant stenosis. These results will be called to the ordering clinician or representative by the Radiologist Assistant, and communication documented in the PACS or zVision Dashboard. Electronically Signed   By: Kristine Garbe M.D.   On: 09/20/2016 22:43   Mr Brain Wo Contrast  Result Date: 09/20/2016 CLINICAL DATA:  83 y/o F; left arm and left leg weakness with decreased sensation. EXAM: MRI HEAD WITHOUT CONTRAST MRA HEAD WITHOUT CONTRAST MRA NECK WITHOUT CONTRAST TECHNIQUE: Multiplanar, multiecho pulse sequences of the brain and surrounding structures were obtained without intravenous contrast. Angiographic images of the Circle of Willis were obtained using MRA technique without intravenous contrast. Angiographic images of the neck were obtained using MRA technique without intravenous contrast. Carotid stenosis measurements (when applicable) are obtained utilizing NASCET criteria, using the distal internal carotid diameter as the denominator. COMPARISON:  09/20/2016 CT head FINDINGS: MRI HEAD FINDINGS Brain: 11 x 7 mm focus of reduced diffusion within  the right posterior limb of internal capsule and lateral thalamus (series 4, image 24 in series 405, image 24) compatible with acute/ early subacute infarction. The infarction demonstrates T2 FLAIR hyperintense signal abnormality. There is no evidence for hemorrhage or mass effect. Patchy confluent nonspecific foci of T2 FLAIR hyperintense signal abnormality in subcortical and periventricular white matter is compatible with advanced chronic microvascular ischemic changes for age and there is diffuse brain parenchymal atrophy. Small cortical infarction is present in the right posterior frontal cortex and there is a chronic lacunar infarction within the left thalamus. Vascular: As below. Skull and upper cervical spine: Normal marrow signal. Sinuses/Orbits: No abnormal signal of paranasal sinuses or mastoid air cells. Bilateral intra-ocular lens replacement. Other: None. MRA HEAD FINDINGS Anterior circulation: No large vessel occlusion, aneurysm, or significant stenosis is identified. Mild lumen irregularity of carotid siphons with mild distal left cavernous stenosis is compatible with atherosclerotic changes. Posterior circulation: Codominant vertebrobasilar system. No large vessel occlusion, aneurysm, or significant stenosis is identified. Anatomic variation: Diminutive posterior communicating arteries and large anterior communicating artery are present. MRA NECK FINDINGS Right carotid system: No aneurysm or hemodynamically significant stenosis by NASCET criteria is identified. Left carotid system: No aneurysm or hemodynamically significant stenosis by NASCET criteria is identified. Vertebrobasilar system: No dissection, aneurysm, or significant stenosis is identified. IMPRESSION: 1. 11 mm acute/early subacute infarction involving right posterior limb of internal capsule and lateral thalamus. No acute hemorrhage or mass effect. 2. Advanced chronic microvascular ischemic changes and parenchymal volume loss  of the  brain. 3. Patent carotid and vertebral arteries. No aneurysm or hemodynamically significant stenosis utilizing NASCET criteria. 4. Patent circle of Willis. No large vessel occlusion, aneurysm, or significant stenosis. These results will be called to the ordering clinician or representative by the Radiologist Assistant, and communication documented in the PACS or zVision Dashboard. Electronically Signed   By: Kristine Garbe M.D.   On: 09/20/2016 22:43   Mr Jodene Nam Head Wo Contrast  Result Date: 09/20/2016 CLINICAL DATA:  83 y/o F; left arm and left leg weakness with decreased sensation. EXAM: MRI HEAD WITHOUT CONTRAST MRA HEAD WITHOUT CONTRAST MRA NECK WITHOUT CONTRAST TECHNIQUE: Multiplanar, multiecho pulse sequences of the brain and surrounding structures were obtained without intravenous contrast. Angiographic images of the Circle of Willis were obtained using MRA technique without intravenous contrast. Angiographic images of the neck were obtained using MRA technique without intravenous contrast. Carotid stenosis measurements (when applicable) are obtained utilizing NASCET criteria, using the distal internal carotid diameter as the denominator. COMPARISON:  09/20/2016 CT head FINDINGS: MRI HEAD FINDINGS Brain: 11 x 7 mm focus of reduced diffusion within the right posterior limb of internal capsule and lateral thalamus (series 4, image 24 in series 405, image 24) compatible with acute/ early subacute infarction. The infarction demonstrates T2 FLAIR hyperintense signal abnormality. There is no evidence for hemorrhage or mass effect. Patchy confluent nonspecific foci of T2 FLAIR hyperintense signal abnormality in subcortical and periventricular white matter is compatible with advanced chronic microvascular ischemic changes for age and there is diffuse brain parenchymal atrophy. Small cortical infarction is present in the right posterior frontal cortex and there is a chronic lacunar infarction within the  left thalamus. Vascular: As below. Skull and upper cervical spine: Normal marrow signal. Sinuses/Orbits: No abnormal signal of paranasal sinuses or mastoid air cells. Bilateral intra-ocular lens replacement. Other: None. MRA HEAD FINDINGS Anterior circulation: No large vessel occlusion, aneurysm, or significant stenosis is identified. Mild lumen irregularity of carotid siphons with mild distal left cavernous stenosis is compatible with atherosclerotic changes. Posterior circulation: Codominant vertebrobasilar system. No large vessel occlusion, aneurysm, or significant stenosis is identified. Anatomic variation: Diminutive posterior communicating arteries and large anterior communicating artery are present. MRA NECK FINDINGS Right carotid system: No aneurysm or hemodynamically significant stenosis by NASCET criteria is identified. Left carotid system: No aneurysm or hemodynamically significant stenosis by NASCET criteria is identified. Vertebrobasilar system: No dissection, aneurysm, or significant stenosis is identified. IMPRESSION: 1. 11 mm acute/early subacute infarction involving right posterior limb of internal capsule and lateral thalamus. No acute hemorrhage or mass effect. 2. Advanced chronic microvascular ischemic changes and parenchymal volume loss of the brain. 3. Patent carotid and vertebral arteries. No aneurysm or hemodynamically significant stenosis utilizing NASCET criteria. 4. Patent circle of Willis. No large vessel occlusion, aneurysm, or significant stenosis. These results will be called to the ordering clinician or representative by the Radiologist Assistant, and communication documented in the PACS or zVision Dashboard. Electronically Signed   By: Kristine Garbe M.D.   On: 09/20/2016 22:43   Disposition   Pt is being discharged home today in good condition.  Follow-up Plans & Appointments    Follow-up Information    Minus Breeding, MD Follow up.   Specialty:   Cardiology Why:  our office will call you with a follow-up appointment in 1-2 weeks. Contact information: [address] [place] [address] [phone]        Estanislado Emms, MD. Schedule an appointment as soon as  possible for a visit.   Specialty:  Nephrology Why:  call Dr. Abel Presto office to set up a hospital follow-up visit  Contact information: [place] [employer] [address] [phone]          Discharge Instructions    Diet - low sodium heart healthy    Complete by:  As directed    Increase activity slowly    Complete by:  As directed       Discharge Medications   Current Discharge Medication List    START taking these medications   Details  nitroGLYCERIN (NITROSTAT) 0.4 MG SL tablet Place 1 tablet (0.4 mg total) under the tongue every 5 (five) minutes x 3 doses as needed for chest pain. Qty: 25 tablet, Refills: 2      CONTINUE these medications which have CHANGED   Details  furosemide (LASIX) 40 MG tablet Take 1 tablet (40 mg total) by mouth daily. Qty: 30 tablet, Refills: 5    isosorbide mononitrate (IMDUR) 30 MG 24 hr tablet Take 1 tablet (30 mg total) by mouth daily. Qty: 30 tablet, Refills: 5      CONTINUE these medications which have NOT CHANGED   Details  amLODipine (NORVASC) 10 MG tablet Take 1 tablet (10 mg total) by mouth daily. Qty: 90 tablet, Refills: 3    atorvastatin (LIPITOR) 40 MG tablet Take 1 tablet (40 mg total) by mouth daily. Qty: 30 tablet, Refills: 0    cholecalciferol (VITAMIN D) 400 units TABS tablet Take 1 tablet (400 Units total) by mouth daily. Qty: 30 each, Refills: 0    cloNIDine HCl (KAPVAY) 0.1 MG TB12 ER tablet Take 1 tablet (0.1 mg total) by mouth 2 (two) times daily.    clopidogrel (PLAVIX) 75 MG tablet Take 1 tablet (75 mg total) by mouth daily. Qty: 30 tablet, Refills: 0    glipiZIDE (GLUCOTROL) 10 MG tablet Take 1 tablet (10 mg total) by mouth daily before  breakfast. Qty: 30 tablet, Refills: 0    metoprolol tartrate (LOPRESSOR) 100 MG tablet Take 1 tablet (100 mg total) by mouth daily. Qty: 30 tablet, Refills: 0         Outstanding Labs/Studies   Needs nephrology f/u and repeat BMP   Duration of Discharge Encounter   Greater than 30 minutes including physician time.  Signed, Lyda Jester PA-C 10/08/2016, 10:32 AM

## 2016-10-08 NOTE — Progress Notes (Signed)
Discharge instructions reviewed with patient, questions answered, verbalized understanding.  Patient requested a release of information form be sent home with her so she can sign it and her son can come and get her medical records.  This was provided.  Patient awaiting ride for discharge.

## 2016-10-08 NOTE — Care Management Note (Addendum)
Case Management Note  Patient Details  Name: [name] MRN: [id number] Date of Birth: April 01, 1927  Subjective/Objective:  83 y.o. F to be discharged home with HHPT/OT provided by [facility]. Jermaine, the rep for that agency is aware and has arranged for contact to be made within 24-48 hours of discharge. A RW will be delivered to room prior to discharge                   Action/Plan:CM will sign off for now but will be available should additional discharge needs arise or disposition change.    Expected Discharge Date:  10/08/16               Expected Discharge Plan:  [place]  In-House Referral:  NA  Discharge planning Services  CM Consult  Post Acute Care Choice:  Home Health Choice offered to:  Patient  DME Arranged:  N/A DME Agency:  NA  HH Arranged:  PT, OT HH Agency:  [place]  Status of Service:  Completed, signed off  If discussed at [place] of Stay Meetings, dates discussed:    Additional Comments:  Delrae Sawyers, RN 10/08/2016, 11:05 AM

## 2016-10-12 ENCOUNTER — Encounter (HOSPITAL_COMMUNITY): Payer: Self-pay | Admitting: Emergency Medicine

## 2016-10-12 DIAGNOSIS — I2510 Atherosclerotic heart disease of native coronary artery without angina pectoris: Secondary | ICD-10-CM | POA: Diagnosis not present

## 2016-10-12 DIAGNOSIS — Z955 Presence of coronary angioplasty implant and graft: Secondary | ICD-10-CM | POA: Diagnosis not present

## 2016-10-12 DIAGNOSIS — I69354 Hemiplegia and hemiparesis following cerebral infarction affecting left non-dominant side: Secondary | ICD-10-CM | POA: Insufficient documentation

## 2016-10-12 DIAGNOSIS — I429 Cardiomyopathy, unspecified: Secondary | ICD-10-CM | POA: Diagnosis not present

## 2016-10-12 DIAGNOSIS — E113299 Type 2 diabetes mellitus with mild nonproliferative diabetic retinopathy without macular edema, unspecified eye: Secondary | ICD-10-CM | POA: Diagnosis not present

## 2016-10-12 DIAGNOSIS — R748 Abnormal levels of other serum enzymes: Secondary | ICD-10-CM | POA: Insufficient documentation

## 2016-10-12 DIAGNOSIS — Z7902 Long term (current) use of antithrombotics/antiplatelets: Secondary | ICD-10-CM | POA: Insufficient documentation

## 2016-10-12 DIAGNOSIS — I129 Hypertensive chronic kidney disease with stage 1 through stage 4 chronic kidney disease, or unspecified chronic kidney disease: Secondary | ICD-10-CM | POA: Insufficient documentation

## 2016-10-12 DIAGNOSIS — N184 Chronic kidney disease, stage 4 (severe): Secondary | ICD-10-CM | POA: Diagnosis not present

## 2016-10-12 DIAGNOSIS — K921 Melena: Secondary | ICD-10-CM | POA: Insufficient documentation

## 2016-10-12 DIAGNOSIS — R55 Syncope and collapse: Secondary | ICD-10-CM | POA: Diagnosis not present

## 2016-10-12 DIAGNOSIS — Z951 Presence of aortocoronary bypass graft: Secondary | ICD-10-CM | POA: Diagnosis not present

## 2016-10-12 DIAGNOSIS — K5900 Constipation, unspecified: Secondary | ICD-10-CM | POA: Diagnosis not present

## 2016-10-12 DIAGNOSIS — I252 Old myocardial infarction: Secondary | ICD-10-CM | POA: Insufficient documentation

## 2016-10-12 DIAGNOSIS — E785 Hyperlipidemia, unspecified: Secondary | ICD-10-CM | POA: Diagnosis not present

## 2016-10-12 DIAGNOSIS — E1122 Type 2 diabetes mellitus with diabetic chronic kidney disease: Secondary | ICD-10-CM | POA: Insufficient documentation

## 2016-10-12 DIAGNOSIS — Z7984 Long term (current) use of oral hypoglycemic drugs: Secondary | ICD-10-CM | POA: Diagnosis not present

## 2016-10-12 DIAGNOSIS — Z79899 Other long term (current) drug therapy: Secondary | ICD-10-CM | POA: Insufficient documentation

## 2016-10-12 NOTE — ED Triage Notes (Signed)
Pt states she had a syncopal episode today where she fell on the couch  The dr that was with her at home said he lifted her feet about her head and she came too immediately  States about an hour later she passed a maroon colored stool  Pt was just released from [facility] on Saturday after having a stroke and she was started on Plavix and Lasix  States she lost about 13 lbs with the lasix

## 2016-10-13 ENCOUNTER — Observation Stay (HOSPITAL_COMMUNITY)
Admission: EM | Admit: 2016-10-13 | Discharge: 2016-10-15 | Disposition: A | Discharge status: Home / Self Care | Payer: Medicare Other | Attending: Internal Medicine | Admitting: Internal Medicine

## 2016-10-13 ENCOUNTER — Encounter (HOSPITAL_COMMUNITY): Payer: Self-pay | Admitting: Family Medicine

## 2016-10-13 ENCOUNTER — Observation Stay (HOSPITAL_COMMUNITY): Payer: Medicare Other

## 2016-10-13 DIAGNOSIS — R7989 Other specified abnormal findings of blood chemistry: Secondary | ICD-10-CM

## 2016-10-13 DIAGNOSIS — I2510 Atherosclerotic heart disease of native coronary artery without angina pectoris: Secondary | ICD-10-CM | POA: Diagnosis not present

## 2016-10-13 DIAGNOSIS — E118 Type 2 diabetes mellitus with unspecified complications: Secondary | ICD-10-CM | POA: Diagnosis not present

## 2016-10-13 DIAGNOSIS — R55 Syncope and collapse: Secondary | ICD-10-CM | POA: Diagnosis not present

## 2016-10-13 DIAGNOSIS — R778 Other specified abnormalities of plasma proteins: Secondary | ICD-10-CM

## 2016-10-13 DIAGNOSIS — R748 Abnormal levels of other serum enzymes: Secondary | ICD-10-CM | POA: Diagnosis not present

## 2016-10-13 DIAGNOSIS — K625 Hemorrhage of anus and rectum: Secondary | ICD-10-CM

## 2016-10-13 DIAGNOSIS — N183 Chronic kidney disease, stage 3 unspecified: Secondary | ICD-10-CM | POA: Diagnosis present

## 2016-10-13 DIAGNOSIS — I10 Essential (primary) hypertension: Secondary | ICD-10-CM | POA: Diagnosis not present

## 2016-10-13 DIAGNOSIS — K5900 Constipation, unspecified: Secondary | ICD-10-CM

## 2016-10-13 DIAGNOSIS — N289 Disorder of kidney and ureter, unspecified: Secondary | ICD-10-CM

## 2016-10-13 DIAGNOSIS — E440 Moderate protein-calorie malnutrition: Secondary | ICD-10-CM | POA: Insufficient documentation

## 2016-10-13 LAB — CBC
HEMATOCRIT: 34.1 % — AB (ref 36.0–46.0)
HEMATOCRIT: 36.5 % (ref 36.0–46.0)
HEMOGLOBIN: 11.3 g/dL — AB (ref 12.0–15.0)
HEMOGLOBIN: 12.3 g/dL (ref 12.0–15.0)
MCH: 28.8 pg (ref 26.0–34.0)
MCH: 29.8 pg (ref 26.0–34.0)
MCHC: 33.1 g/dL (ref 30.0–36.0)
MCHC: 33.7 g/dL (ref 30.0–36.0)
MCV: 87 fL (ref 78.0–100.0)
MCV: 88.4 fL (ref 78.0–100.0)
Platelets: 252 10*3/uL (ref 150–400)
Platelets: 282 10*3/uL (ref 150–400)
RBC: 3.92 MIL/uL (ref 3.87–5.11)
RBC: 4.13 MIL/uL (ref 3.87–5.11)
RDW: 13.9 % (ref 11.5–15.5)
RDW: 14 % (ref 11.5–15.5)
WBC: 6.9 10*3/uL (ref 4.0–10.5)
WBC: 7.4 10*3/uL (ref 4.0–10.5)

## 2016-10-13 LAB — BASIC METABOLIC PANEL
ANION GAP: 13 (ref 5–15)
BUN: 55 mg/dL — AB (ref 6–20)
CO2: 20 mmol/L — AB (ref 22–32)
Calcium: 10.5 mg/dL — ABNORMAL HIGH (ref 8.9–10.3)
Chloride: 101 mmol/L (ref 101–111)
Creatinine, Ser: 2.57 mg/dL — ABNORMAL HIGH (ref 0.44–1.00)
GFR calc Af Amer: 18 mL/min — ABNORMAL LOW (ref >60)
GFR, EST NON AFRICAN AMERICAN: 16 mL/min — AB (ref >60)
GLUCOSE: 164 mg/dL — AB (ref 65–99)
POTASSIUM: 4.6 mmol/L (ref 3.5–5.1)
Sodium: 134 mmol/L — ABNORMAL LOW (ref 135–145)

## 2016-10-13 LAB — DIFFERENTIAL
BASOS PCT: 0 %
Basophils Absolute: 0 10*3/uL (ref 0.0–0.1)
Eosinophils Absolute: 0.1 10*3/uL (ref 0.0–0.7)
Eosinophils Relative: 2 %
Lymphocytes Relative: 22 %
Lymphs Abs: 1.6 10*3/uL (ref 0.7–4.0)
MONOS PCT: 8 %
Monocytes Absolute: 0.6 10*3/uL (ref 0.1–1.0)
NEUTROS PCT: 68 %
Neutro Abs: 4.7 10*3/uL (ref 1.7–7.7)

## 2016-10-13 LAB — COMPREHENSIVE METABOLIC PANEL
ALK PHOS: 65 U/L (ref 38–126)
ALT: 17 U/L (ref 14–54)
AST: 15 U/L (ref 15–41)
Albumin: 4 g/dL (ref 3.5–5.0)
Anion gap: 10 (ref 5–15)
BILIRUBIN TOTAL: 0.8 mg/dL (ref 0.3–1.2)
BUN: 57 mg/dL — ABNORMAL HIGH (ref 6–20)
CALCIUM: 10.3 mg/dL (ref 8.9–10.3)
CO2: 24 mmol/L (ref 22–32)
CREATININE: 2.47 mg/dL — AB (ref 0.44–1.00)
Chloride: 106 mmol/L (ref 101–111)
GFR calc non Af Amer: 16 mL/min — ABNORMAL LOW (ref >60)
GFR, EST AFRICAN AMERICAN: 19 mL/min — AB (ref >60)
GLUCOSE: 159 mg/dL — AB (ref 65–99)
Potassium: 4.7 mmol/L (ref 3.5–5.1)
SODIUM: 140 mmol/L (ref 135–145)
TOTAL PROTEIN: 7.7 g/dL (ref 6.5–8.1)

## 2016-10-13 LAB — URINALYSIS, ROUTINE W REFLEX MICROSCOPIC
BILIRUBIN URINE: NEGATIVE
Glucose, UA: NEGATIVE mg/dL
Hgb urine dipstick: NEGATIVE
Ketones, ur: NEGATIVE mg/dL
NITRITE: NEGATIVE
PH: 5 (ref 5.0–8.0)
Protein, ur: 100 mg/dL — AB
SPECIFIC GRAVITY, URINE: 1.013 (ref 1.005–1.030)

## 2016-10-13 LAB — CBG MONITORING, ED: GLUCOSE-CAPILLARY: 185 mg/dL — AB (ref 65–99)

## 2016-10-13 LAB — TROPONIN I
TROPONIN I: 0.03 ng/mL — AB (ref <0.03)
Troponin I: 0.03 ng/mL (ref <0.03)

## 2016-10-13 LAB — POC OCCULT BLOOD, ED: Fecal Occult Bld: NEGATIVE

## 2016-10-13 LAB — GLUCOSE, CAPILLARY
GLUCOSE-CAPILLARY: 166 mg/dL — AB (ref 65–99)
Glucose-Capillary: 179 mg/dL — ABNORMAL HIGH (ref 65–99)
Glucose-Capillary: 142 mg/dL — ABNORMAL HIGH (ref 65–99)

## 2016-10-13 MED ORDER — ACETAMINOPHEN 325 MG PO TABS: 650.0000 mg | ORAL_TABLET | Freq: Four times a day (QID) | ORAL | Status: DC | PRN

## 2016-10-13 MED ORDER — CLOPIDOGREL BISULFATE 75 MG PO TABS
75.0000 mg | ORAL_TABLET | Freq: Every day | ORAL | Status: DC
  Administered 2016-10-13 – 2016-10-15 (×3): 75 mg via ORAL
  Filled 2016-10-13 (×3): qty 1

## 2016-10-13 MED ORDER — ONDANSETRON HCL 4 MG/2ML IJ SOLN
4.0000 mg | Freq: Four times a day (QID) | INTRAMUSCULAR | Status: DC | PRN
  Administered 2016-10-14: 4 mg via INTRAVENOUS
  Filled 2016-10-13: qty 2

## 2016-10-13 MED ORDER — ONDANSETRON HCL 4 MG PO TABS: 4.0000 mg | ORAL_TABLET | Freq: Four times a day (QID) | ORAL | Status: DC | PRN

## 2016-10-13 MED ORDER — BISACODYL 5 MG PO TBEC: 5.0000 mg | DELAYED_RELEASE_TABLET | Freq: Every day | ORAL | Status: DC | PRN

## 2016-10-13 MED ORDER — ADULT MULTIVITAMIN W/MINERALS CH
1.0000 | ORAL_TABLET | Freq: Every day | ORAL | Status: DC
  Administered 2016-10-13 – 2016-10-15 (×3): 1 via ORAL
  Filled 2016-10-13 (×3): qty 1

## 2016-10-13 MED ORDER — METOPROLOL TARTRATE 50 MG PO TABS
50.0000 mg | ORAL_TABLET | Freq: Two times a day (BID) | ORAL | Status: DC
  Administered 2016-10-13 – 2016-10-15 (×5): 50 mg via ORAL
  Filled 2016-10-13 (×5): qty 1

## 2016-10-13 MED ORDER — INSULIN ASPART 100 UNIT/ML ~~LOC~~ SOLN
0.0000 [IU] | Freq: Two times a day (BID) | SUBCUTANEOUS | Status: DC
  Administered 2016-10-13 (×2): 2 [IU] via SUBCUTANEOUS
  Administered 2016-10-14: 1 [IU] via SUBCUTANEOUS
  Administered 2016-10-14 – 2016-10-15 (×2): 2 [IU] via SUBCUTANEOUS

## 2016-10-13 MED ORDER — SENNA 8.6 MG PO TABS
1.0000 | ORAL_TABLET | Freq: Every day | ORAL | Status: DC | PRN
  Administered 2016-10-14: 8.6 mg via ORAL
  Filled 2016-10-13: qty 1

## 2016-10-13 MED ORDER — ATORVASTATIN CALCIUM 40 MG PO TABS
40.0000 mg | ORAL_TABLET | Freq: Every day | ORAL | Status: DC
  Administered 2016-10-13 – 2016-10-15 (×3): 40 mg via ORAL
  Filled 2016-10-13 (×3): qty 1

## 2016-10-13 MED ORDER — SODIUM CHLORIDE 0.9% FLUSH
3.0000 mL | Freq: Two times a day (BID) | INTRAVENOUS | Status: DC
  Administered 2016-10-13 – 2016-10-14 (×4): 3 mL via INTRAVENOUS

## 2016-10-13 MED ORDER — ISOSORBIDE MONONITRATE ER 30 MG PO TB24
30.0000 mg | ORAL_TABLET | Freq: Every day | ORAL | Status: DC
  Administered 2016-10-13 – 2016-10-15 (×3): 30 mg via ORAL
  Filled 2016-10-13 (×4): qty 1

## 2016-10-13 MED ORDER — POLYETHYLENE GLYCOL 3350 17 G PO PACK
17.0000 g | PACK | Freq: Every day | ORAL | Status: DC
  Administered 2016-10-13 – 2016-10-15 (×4): 17 g via ORAL
  Filled 2016-10-13 (×3): qty 1

## 2016-10-13 MED ORDER — ACETAMINOPHEN 650 MG RE SUPP: 650.0000 mg | Freq: Four times a day (QID) | RECTAL | Status: DC | PRN

## 2016-10-13 MED ORDER — FUROSEMIDE 40 MG PO TABS
40.0000 mg | ORAL_TABLET | Freq: Every day | ORAL | Status: DC
  Administered 2016-10-13: 40 mg via ORAL
  Filled 2016-10-13 (×3): qty 1

## 2016-10-13 MED ORDER — GLYCERIN (LAXATIVE) 2.1 G RE SUPP
1.0000 | Freq: Once | RECTAL | Status: AC
  Administered 2016-10-13: 1 via RECTAL
  Filled 2016-10-13: qty 1

## 2016-10-13 MED ORDER — PREMIER PROTEIN SHAKE
11.0000 [oz_av] | ORAL | Status: DC
  Administered 2016-10-13 – 2016-10-15 (×2): 11 [oz_av] via ORAL
  Filled 2016-10-13 (×3): qty 325.31

## 2016-10-13 MED ORDER — GLIPIZIDE 10 MG PO TABS
10.0000 mg | ORAL_TABLET | Freq: Every day | ORAL | Status: DC
  Administered 2016-10-13 – 2016-10-15 (×3): 10 mg via ORAL
  Filled 2016-10-13 (×3): qty 1

## 2016-10-13 MED ORDER — AMLODIPINE BESYLATE 10 MG PO TABS
10.0000 mg | ORAL_TABLET | Freq: Every day | ORAL | Status: DC
  Administered 2016-10-13 – 2016-10-15 (×3): 10 mg via ORAL
  Filled 2016-10-13 (×3): qty 1

## 2016-10-13 MED ORDER — SENNOSIDES-DOCUSATE SODIUM 8.6-50 MG PO TABS
1.0000 | ORAL_TABLET | Freq: Two times a day (BID) | ORAL | Status: DC
  Administered 2016-10-13: 1 via ORAL
  Filled 2016-10-13 (×2): qty 1

## 2016-10-13 NOTE — ED Provider Notes (Signed)
[facility] DEPT Provider Note   CSN: [id number] Arrival date & time: 10/12/16  1654     History   Chief Complaint Chief Complaint  Patient presents with  . Loss of Consciousness    HPI [name] is a 83 y.o. female.  The history is provided by the patient and a relative.  Loss of Consciousness    She had a syncopal episode this afternoon. She was being ambulated with assistance of when she passed out with no apparent warning. A relative who is with her, and 2 is a physician, noted complete loss of consciousness but no difficulty with breathing. Loss of consciousness lasted about 4 minutes. She denies any warning of impending syncope and she denies any chest pain, heaviness, tightness, pressure. She was noted to have soiled her depends with what appeared to be bright red blood. She had been admitted to the hospital last week with chest pain and was ruled out for myocardial infarction. She had been discharged with a four-day course of furosemide. Relative is concerned because her BUN was elevated and she has lost 13 pounds from that short course of furosemide. She also had been admitted last monthfor a stroke with left hemiparesis. She has residual left-sided weakness-leg more than arm. She had been started on clopidogrel following the stroke. Of note, she does have a history of coronary artery disease status post CABG and RCA stent, known history of chronic kidney disease stage III, known history of cardiomyopathy with EF 35%.  Past Medical History:  Diagnosis Date  . Acute renal injury ([place]) 07/2016  . Anginal pain ([place]) 2004; 2011  . CAD (coronary artery disease)    status post stenting of the RCA status post CABG. (left internal mammary artery to  LAD, saphenous vein graft to second diagonal, saphenous vein  graft to obtuse marginal 1, saphenous vein graft to posterior  descending).  . Cardiomyopathy     Mildly reduced EF (45%).   . Chronic kidney disease (CKD), stage III  (moderate)   . History of blood transfusion 1960s   "related to anemia"  . HTN (hypertension)   . Myocardial infarction ([place]) 2011  . Pleural effusion 2011   S/P "bypass"  . Shortness of breath   . Stroke ([place]) 09/19/2016   "left sided weakness" (09/20/2016)  . Type II diabetes mellitus [facility])     Patient Active Problem List   Diagnosis Date Noted  . Chest pain with moderate risk for cardiac etiology 10/06/2016  . Labile blood pressure   . Stage 3 chronic kidney disease   . AKI (acute kidney injury) ([place])   . Acute blood loss anemia   . Small vessel disease 09/22/2016  . Hemiparesis affecting left side as late effect of stroke ([place]) 09/22/2016  . Gait disturbance, post-stroke 09/22/2016  . Stroke (cerebrum) ([place]) 09/20/2016  . Diabetes mellitus with complication ([place])   . Ischemic stroke ([place])   . Status post intraocular lens implant 06/23/2011  . Glaucoma suspect 12/30/2010  . Macular hole 12/30/2010  . Nonproliferative diabetic retinopathy ([place]) 12/30/2010  . Syncope 06/17/2010  . CARDIOMYOPATHY 04/15/2010  . PLEURAL EFFUSION 04/14/2010  . SHORTNESS OF BREATH 04/14/2010  . CORONARY ATHEROSCLEROSIS NATIVE CORONARY ARTERY 01/19/2009  . Hyperlipidemia 01/06/2009  . Diabetes mellitus ([place]) 01/05/2009  . Essential hypertension 01/05/2009  . Coronary atherosclerosis 01/05/2009    Past Surgical History:  Procedure Laterality Date  . CATARACT EXTRACTION W/ INTRAOCULAR LENS  IMPLANT, BILATERAL Bilateral   . CORONARY ANGIOPLASTY WITH STENT  PLACEMENT  2004   "LAD & one of the circumflex"  . CORONARY ARTERY BYPASS GRAFT  2011   CABG X4    OB History    No data available       Home Medications    Prior to Admission medications   Medication Sig Start Date End Date Taking? Authorizing Provider  amLODipine (NORVASC) 10 MG tablet Take 1 tablet (10 mg total) by mouth daily. 10/06/16   Angiulli, Lavon Paganini, PA-C  atorvastatin (LIPITOR) 40 MG tablet Take 1 tablet (40 mg total) by  mouth daily. 10/06/16 10/06/17  Angiulli, Lavon Paganini, PA-C  cholecalciferol (VITAMIN D) 400 units TABS tablet Take 1 tablet (400 Units total) by mouth daily. 10/06/16   Angiulli, Lavon Paganini, PA-C  cloNIDine HCl (KAPVAY) 0.1 MG TB12 ER tablet Take 1 tablet (0.1 mg total) by mouth 2 (two) times daily. 10/08/16   Geralynn Ochs T, MD  clopidogrel (PLAVIX) 75 MG tablet Take 1 tablet (75 mg total) by mouth daily. 10/06/16 10/06/17  Angiulli, Lavon Paganini, PA-C  furosemide (LASIX) 40 MG tablet Take 1 tablet (40 mg total) by mouth daily. 10/08/16   Lyda Jester M, PA-C  glipiZIDE (GLUCOTROL) 10 MG tablet Take 1 tablet (10 mg total) by mouth daily before breakfast. 10/06/16   Angiulli, Lavon Paganini, PA-C  isosorbide mononitrate (IMDUR) 30 MG 24 hr tablet Take 1 tablet (30 mg total) by mouth daily. 10/08/16   Lyda Jester M, PA-C  metoprolol tartrate (LOPRESSOR) 100 MG tablet Take 1 tablet (100 mg total) by mouth daily. 10/06/16   Angiulli, Lavon Paganini, PA-C  nitroGLYCERIN (NITROSTAT) 0.4 MG SL tablet Place 1 tablet (0.4 mg total) under the tongue every 5 (five) minutes x 3 doses as needed for chest pain. 10/08/16   Consuelo Pandy, PA-C    Family History Family History  Problem Relation Age of Onset  . Diabetes Father 38  . Unexplained death Mother 77  . CAD Neg Hx     Social History Social History  Substance Use Topics  . Smoking status: Never Smoker  . Smokeless tobacco: Never Used  . Alcohol use No     Allergies   Patient has no known allergies.   Review of Systems Review of Systems  Cardiovascular: Positive for syncope.  All other systems reviewed and are negative.    Physical Exam Updated Vital Signs BP (!) 207/93 (BP Location: Left Arm)   Pulse 90   Temp 97.9 F (36.6 C) (Oral)   Resp 18   SpO2 97%   Physical Exam  Nursing note and vitals reviewed.  83 year old female, resting comfortably and in no acute distress. Vital signs are significant for hypertension. Oxygen saturation is 97%,  which is normal. Head is normocephalic and atraumatic. PERRLA, EOMI. Oropharynx is clear. Fundi show arteriolar narrowing, but no hemorrhage or exudate. Neck is nontender and supple without adenopathy or JVD. There are no carotid bruits. Back is nontender and there is no CVA tenderness. Lungs are clear without rales, wheezes, or rhonchi. Chest is nontender. Heart has regular rate and rhythm without murmur. Abdomen is soft, flat, nontender without masses or hepatosplenomegaly and peristalsis is normoactive. Rectal: Normal sphincter tone. Small external hemorrhoids are present. Soft fecal impaction is present. Stool is light brown and has been sent for Hemoccult testing. No gross blood. Extremities have no cyanosis or edema, full range of motion is present. Skin is warm and dry without rash. Neurologic: Mental status is normal, cranial nerves are intact.  There is mild left hemiparesis.  ED Treatments / Results  Labs (all labs ordered are listed, but only abnormal results are displayed) Labs Reviewed  BASIC METABOLIC PANEL - Abnormal; Notable for the following:       Result Value   Sodium 134 (*)    CO2 20 (*)    Glucose, Bld 164 (*)    BUN 55 (*)    Creatinine, Ser 2.57 (*)    Calcium 10.5 (*)    GFR calc non Af Amer 16 (*)    GFR calc Af Amer 18 (*)    All other components within normal limits  URINALYSIS, ROUTINE W REFLEX MICROSCOPIC - Abnormal; Notable for the following:    APPearance HAZY (*)    Protein, ur 100 (*)    Leukocytes, UA SMALL (*)    Bacteria, UA RARE (*)    Squamous Epithelial / LPF 0-5 (*)    All other components within normal limits  TROPONIN I - Abnormal; Notable for the following:    Troponin I 0.03 (*)    All other components within normal limits  CBG MONITORING, ED - Abnormal; Notable for the following:    Glucose-Capillary 185 (*)    All other components within normal limits  CBC  DIFFERENTIAL  TROPONIN I  TROPONIN I  POC OCCULT BLOOD, ED    EKG   EKG Interpretation  Date/Time:  Wednesday October 12 2016 23:47:24 EDT Ventricular Rate:  87 PR Interval:  154 QRS Duration: 88 QT Interval:  360 QTC Calculation: 433 R Axis:   -23 Text Interpretation:  Normal sinus rhythm Moderate voltage criteria for LVH, may be normal variant Nonspecific ST and T wave abnormality Abnormal ECG When compared with ECG of 10/07/2016, No significant change was found Confirmed by Delora Fuel ([id number]) on 10/12/2016 11:53:05 PM      Procedures Procedures (including critical care time)  Medications Ordered in ED Medications - No data to display   Initial Impression / Assessment and Plan / ED Course  I have reviewed the triage vital signs and the nursing notes.  Pertinent labs & imaging results that were available during my care of the patient were reviewed by me and considered in my medical decision making (see chart for details).  Syncopal episode of uncertain cause. No acute ECG changes. Screening labs of been obtained. Old records are reviewed confirming recent hospitalizations. Here has been recent worsening of renal function with creatinine 1.2 in 2013, 2.0 on admission for her stroke on September 20, 2016, 2.6on admission for chest pain on 10/07/2016, and 2.75 on discharge on 10/08/2016. Today, creatinine has dropped slightly, and BUN is stable. Orthostatic vital signs do not show any significant drop in blood pressure or rise in pulse rate.Hemoglobin has increased from baseline. ?Troponin is borderline elevated, and will need to be cycled. Because of sudden syncope without prodrome, she will need to be admitted for cardiac monitoring. Case is discussed with Dr. Loleta Books of triad hospitalists who agrees to admit the patient.  Final Clinical Impressions(s) / ED Diagnoses   Final diagnoses:  Syncope and collapse  Renal insufficiency  Elevated troponin I level  Essential hypertension    New Prescriptions New Prescriptions   No medications on file       Delora Fuel, MD 03/[birth year]/82 [phone]

## 2016-10-13 NOTE — Care Management Obs Status (Signed)
[facility] NOTIFICATION   Patient Details  Name: Rehana Uncapher MRN: [id number] Date of Birth: October 04, 1927   Medicare Observation Status Notification Given:  Yes    MahabirJuliann Pulse, RN 10/13/2016, 12:24 PM

## 2016-10-13 NOTE — H&P (Signed)
History and Physical  Patient Name: [name]     IWL:[id number]    DOB: 1928-01-12    DOA: 10/13/2016 PCP: Deland Pretty, MD  Patient coming from: Home  Chief Complaint: Syncope      HPI: [name] is a 83 y.o. female with a past medical history significant for NIDDM, CKD, HTN, CAD s/p CABG in 2011, and recent CVA who presents with syncope.  The patient was in her usual state of health (lived independently, active, still driving) until August a few weeks ago when she had a stroke.  Since then she was in rehab, was started on Plavix, and had rehabbed well.  She did have a readmission briefly for chest pain, had serially negative troponins, did not undergo cath or stress testing, was started on Imdur for angina.  Since then she has been back home, daughter with her some during the day, a nephew at night.  Walking with a walker.  Tonight, she was walking from her bedroom into the living room when she collapsed.  She felt no prodrome, nausea, dizziness, warmth, dyspnea chest pain or palpitations.  Her son (who is a physician) was there and saw her collapsing and lowered her into a chair then onto the ground and held her legs above her head.  She thinks she lost consciousness then slowly came back. Son then brought her to the ER.  At some point, he checked her Depends and saw what appeared to be bright red blood.  ED course: -Afebrile, heart rate 68, respirations and pulse ox normal, BP 139/72 -Na 134, K 4.6, Cr 2.57 (baseline 2-2.7 since this last hospitalization), WBC 7.4K, Hgb 12.3 (increased from discahrge) -Troponin 0.03 -UA wtihout pyuria or hematuria -Rectal exam showed brown stool and was heme negative -Her orthostatics were normal -[facility] were asked to evaluate for syncope   No previous history of GIB.  No NSAID use.  New Plavix started this past month.          ROS: Review of Systems  Constitutional: Negative for malaise/fatigue.  Respiratory: Negative for shortness  of breath.   Cardiovascular: Negative for chest pain, palpitations and leg swelling.  Gastrointestinal: Positive for blood in stool.  Neurological: Positive for loss of consciousness. Negative for dizziness, focal weakness, weakness and headaches.  All other systems reviewed and are negative.         Past Medical History:  Diagnosis Date  . Acute renal injury ([place]) 07/2016  . Anginal pain ([place]) 2004; 2011  . CAD (coronary artery disease)    status post stenting of the RCA status post CABG. (left internal mammary artery to  LAD, saphenous vein graft to second diagonal, saphenous vein  graft to obtuse marginal 1, saphenous vein graft to posterior  descending).  . Cardiomyopathy     Mildly reduced EF (45%).   . Chronic kidney disease (CKD), stage III (moderate)   . History of blood transfusion 1960s   "related to anemia"  . HTN (hypertension)   . Myocardial infarction ([place]) 2011  . Pleural effusion 2011   S/P "bypass"  . Shortness of breath   . Stroke ([place]) 09/19/2016   "left sided weakness" (09/20/2016)  . Type II diabetes mellitus ([place])     Past Surgical History:  Procedure Laterality Date  . CATARACT EXTRACTION W/ INTRAOCULAR LENS  IMPLANT, BILATERAL Bilateral   . CORONARY ANGIOPLASTY WITH STENT PLACEMENT  2004   "LAD & one of the circumflex"  . CORONARY ARTERY BYPASS GRAFT  2011  CABG X4    Social History: Patient lived alone until her stroke.  The patient walks with a walker now, not before.  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  Nonsmoker.  No Known Allergies  Family history: family history includes Diabetes (age of onset: 55) in her father; Unexplained death (age of onset: 36) in her mother.  Prior to Admission medications   Medication Sig Start Date End Date Taking? Authorizing Provider  amLODipine (NORVASC) 10 MG tablet Take 1 tablet (10 mg total) by mouth daily. 10/06/16  Yes Angiulli, Lavon Paganini, PA-C  atorvastatin (LIPITOR) 40 MG  tablet Take 1 tablet (40 mg total) by mouth daily. 10/06/16 10/06/17 Yes Angiulli, Lavon Paganini, PA-C  cholecalciferol (VITAMIN D) 400 units TABS tablet Take 1 tablet (400 Units total) by mouth daily. 10/06/16  Yes Angiulli, Lavon Paganini, PA-C  cloNIDine HCl (KAPVAY) 0.1 MG TB12 ER tablet Take 1 tablet (0.1 mg total) by mouth 2 (two) times daily. 10/08/16  Yes Geralynn Ochs T, MD  clopidogrel (PLAVIX) 75 MG tablet Take 1 tablet (75 mg total) by mouth daily. 10/06/16 10/06/17 Yes Angiulli, Lavon Paganini, PA-C  furosemide (LASIX) 40 MG tablet Take 1 tablet (40 mg total) by mouth daily. 10/08/16  Yes Lyda Jester M, PA-C  glipiZIDE (GLUCOTROL) 10 MG tablet Take 1 tablet (10 mg total) by mouth daily before breakfast. 10/06/16  Yes Angiulli, Lavon Paganini, PA-C  isosorbide mononitrate (IMDUR) 30 MG 24 hr tablet Take 1 tablet (30 mg total) by mouth daily. 10/08/16  Yes Lyda Jester M, PA-C  metoprolol tartrate (LOPRESSOR) 50 MG tablet Take 50 mg by mouth 2 (two) times daily.   Yes [provider]  nitroGLYCERIN (NITROSTAT) 0.4 MG SL tablet Place 1 tablet (0.4 mg total) under the tongue every 5 (five) minutes x 3 doses as needed for chest pain. 10/08/16  Yes Consuelo Pandy, PA-C       Physical Exam: BP (!) 159/76 (BP Location: Left Arm)   Pulse 79   Temp 98.7 F (37.1 C) (Oral)   Resp 16   Ht 5\' 3"  (1.6 m)   Wt 73.3 kg (161 lb 9.6 oz)   SpO2 100%   BMI 28.63 kg/m  General appearance: Well-developed, elderly adult female, alert and in no acute distress.   Eyes: Anicteric, conjunctiva pink, lids and lashes normal. PERRL.    ENT: No nasal deformity, discharge, epistaxis.  Hearing normal. OP moist without lesions.   Neck: No neck masses.  Trachea midline.  No thyromegaly/tenderness. Lymph: No cervical or supraclavicular lymphadenopathy. Skin: Warm and dry.  No suspicious rashes or lesions. Cardiac: RRR, nl S1-S2, no murmurs appreciated.  Capillary refill is brisk.  JVP not visible.  No LE edema.  Radial  and DP pulses 2+ and symmetric. Respiratory: Normal respiratory rate and rhythm.  CTAB without rales or wheezes. Abdomen: Abdomen soft.  No TTP. No ascites, distension, hepatosplenomegaly.   MSK: No deformities or effusions.  No cyanosis or clubbing. Neuro: Pupils are 3 mm and reactive to 2 mm.  Extraocular movements are intact, without nystagmus.  Cranial nerve 5 is within normal limits.  Cranial nerve 7 is symmetrical.  Cranial nerve 8 is within normal limits.  Cranial nerves 9 and 10 reveal equal palate elevation.  Cranial nerve 11 reveals sternocleidomastoid strong.  Cranial nerve 12 is midline.  Motor strength testing is 5/5 in the upper and lower extremities bilaterally with normal motor, tone and bulk. The patient is  oriented to time, place and person.  Speech is slightly dysarthric.  Naming is grossly intact.  Recall, recent and remote, as well as general fund of knowledge seem within normal limits.  Attention span and concentration are within normal limits.  Psych: Sensorium intact and responding to questions, attention normal.  Behavior appropriate.  Affect normal.  Judgment and insight appear normal.     Labs on Admission:  I have personally reviewed following labs and imaging studies: CBC:  Recent Labs Lab 10/06/16 1904 10/13/16 0008  WBC 5.8 7.4  NEUTROABS  --  4.7  HGB 11.1* 12.3  HCT 33.8* 36.5  MCV 88.9 88.4  PLT 257 283   Basic Metabolic Panel:  Recent Labs Lab 10/06/16 0721 10/06/16 1904 10/07/16 0438 10/08/16 0352 10/13/16 0008  NA 138  --  137 133* 134*  K 4.4  --  4.1 4.2 4.6  CL 108  --  108 103 101  CO2 25  --  20* 20* 20*  GLUCOSE 136*  --  108* 125* 164*  BUN 51*  --  53* 57* 55*  CREATININE 2.14* 2.46* 2.59* 2.75* 2.57*  CALCIUM 10.0  --  9.7 9.6 10.5*   GFR: Estimated Creatinine Clearance: 14.8 mL/min (A) (by C-G formula based on SCr of 2.57 mg/dL (H)).  Liver Function Tests: No results for input(s): AST, ALT, ALKPHOS, BILITOT, PROT, ALBUMIN in  the last 168 hours. No results for input(s): LIPASE, AMYLASE in the last 168 hours. No results for input(s): AMMONIA in the last 168 hours. Coagulation Profile: No results for input(s): INR, PROTIME in the last 168 hours. Cardiac Enzymes:  Recent Labs Lab 10/06/16 1312 10/06/16 1904 10/07/16 0250 10/07/16 0438 10/13/16 0026  TROPONINI <0.03 <0.03 <0.03 <0.03 0.03*   BNP (last 3 results) No results for input(s): PROBNP in the last 8760 hours. HbA1C: No results for input(s): HGBA1C in the last 72 hours. CBG:  Recent Labs Lab 10/07/16 1605 10/07/16 2051 10/08/16 0724 10/08/16 1140 10/13/16 0026  GLUCAP 147* 147* 130* 199* 185*   Lipid Profile: No results for input(s): CHOL, HDL, LDLCALC, TRIG, CHOLHDL, LDLDIRECT in the last 72 hours. Thyroid Function Tests: No results for input(s): TSH, T4TOTAL, FREET4, T3FREE, THYROIDAB in the last 72 hours. Anemia Panel: No results for input(s): VITAMINB12, FOLATE, FERRITIN, TIBC, IRON, RETICCTPCT in the last 72 hours. Sepsis Labs: Invalid input(s): PROCALCITONIN, LACTICIDVEN No results found for this or any previous visit (from the past 240 hour(s)).       EKG: Independently reviewed. Rate 87, QTc 433, TW changes are old.  Echocardiogram 2018 last month: Report reviewed EF 55-60% Grade I DD         Assessment/Plan Principal Problem:   Syncope Active Problems:   Essential hypertension   Coronary atherosclerosis   Diabetes mellitus with complication (HCC)   Stage 3 chronic kidney disease   Rectal bleed  1. Syncope:  Suspect this is from her new Imdur in combination with clonidine.  Not particularly orthostatic here however.  Has underlying heart disease but normal heart on echo last month.  Troponin minimally elevated tonight. -Trend troponins -Monitor on tele -Hold clonidine for now   2. Reported hematochezia:  Given normal rectal exam here and negative FOBT testing, I am unsure what was observed at home.      -In light of her normal hemodynamics, stable Hgb, I will actually continue Plavix and observe for now.  3. Elevated troponin:  Minimal elevation.  In setting of renal disease, possibly poor  clearance. -Trend troponin  4. Diabetes:  -Continue glipizide -SSI  5. CAD and HTN:  -Continue Plavix, statin -Continue amlodipine, Imdur, furosemide, metorpolol -Hold clonidine      DVT prophylaxis: Ordered Code Status: FULL  Family Communication: None present  Disposition Plan: Anticipate observation on tele overnight.  If normal and back to normal in AM, likely home today. Consults called: None overnight Admission status: OBS At the point of initial evaluation, it is my clinical opinion that admission for OBSERVATION is reasonable and necessary because the patient's presenting complaints in the context of their chronic conditions represent sufficient risk of deterioration or significant morbidity to constitute reasonable grounds for close observation in the hospital setting, but that the patient may be medically stable for discharge from the hospital within 24 to 48 hours.    Medical decision making: Patient seen at 4:10 AM on 10/13/2016.  The patient was discussed with Dr. Roxanne Mins.  What exists of the patient's chart was reviewed in depth and summarized above.  Clinical condition: stable.        Edwin Dada [facility] Pager [phone]

## 2016-10-13 NOTE — ED Notes (Addendum)
0.03 Troponin critical result verified from lab. Dr. Roxanne Mins made aware.

## 2016-10-13 NOTE — Evaluation (Signed)
Physical Therapy Evaluation Patient Details Name: [name] MRN: [id number] DOB: Oct 13, 1927 Today's Date: 10/13/2016   History of Present Illness  83 yo female admitted after having syncope and collapse at home. Recent R posterior limb/thalamus CVA 08/2016-pt completed CIR, DM, CKD, CABG, MI, CAD.     Clinical Impression  On eval, pt was Min guard assist for mobility. She walked ~20 feet in room with a RW. She denied dizziness/lightheadedness. Pt declined hallway ambulation due to fear of having an uncontrolled BM (recently received meds ). Pt presents with general weakness, decreased activity tolerance, and impaired gait and balance. Discussed d/c plan-pt plans to return home. Recommend HHPT f/u.     Follow Up Recommendations Home health PT    Equipment Recommendations  None recommended by PT    Recommendations for Other Services       Precautions / Restrictions Precautions Precautions: Fall Precaution Comments: residual L sided weakness Restrictions Weight Bearing Restrictions: No      Mobility  Bed Mobility Overal bed mobility: Needs Assistance Bed Mobility: Supine to Sit;Sit to Supine     Supine to sit: Supervision;HOB elevated Sit to supine: Supervision;HOB elevated   General bed mobility comments: for safety  Transfers Overall transfer level: Needs assistance Equipment used: Rolling walker (2 wheeled) Transfers: Sit to/from Stand Sit to Stand: Min guard         General transfer comment: close guard for safety. VCs hand placement  Ambulation/Gait Ambulation/Gait assistance: Min guard Ambulation Distance (Feet): 20 Feet Assistive device: Rolling walker (2 wheeled) Gait Pattern/deviations: Step-through pattern;Decreased stride length     General Gait Details: very close guarding. L LE weakness noted. Pt denied lightheadedness/dizziness. She declined hallway ambulation due to recently receiving meds to induce a BM.   Stairs            Wheelchair  Mobility    Modified Rankin (Stroke Patients Only)       Balance Overall balance assessment: Needs assistance         Standing balance support: Bilateral upper extremity supported Standing balance-Leahy Scale: Poor Standing balance comment: requiring RW for support                             Pertinent Vitals/Pain Pain Assessment: Faces Faces Pain Scale: Hurts little more Pain Location: abdominal discomfort Pain Descriptors / Indicators: Sore;Discomfort Pain Intervention(s): Limited activity within patient's tolerance    Home Living Family/patient expects to be discharged to:: Private residence Living Arrangements: Alone Available Help at Discharge: Available PRN/intermittently Type of Home: House Home Access: Stairs to enter Entrance Stairs-Rails: None Entrance Stairs-Number of Steps: 2 Home Layout: Multi-level Home Equipment: Environmental consultant - 2 wheels;Shower seat - built in;Grab bars - tub/shower      Prior Function Level of Independence: Independent with assistive device(s)         Comments: using RW for ambulation     Hand Dominance        Extremity/Trunk Assessment   Upper Extremity Assessment Upper Extremity Assessment: LUE deficits/detail LUE Deficits / Details: residual weakness s/p recent CVA    Lower Extremity Assessment Lower Extremity Assessment: Generalized weakness LLE Deficits / Details: residual weakness s/p recent CVA       Communication   Communication: No difficulties  Cognition Arousal/Alertness: Awake/alert Behavior During Therapy: WFL for tasks assessed/performed Overall Cognitive Status: Within Functional Limits for tasks assessed  General Comments      Exercises     Assessment/Plan    PT Assessment Patient needs continued PT services  PT Problem List Decreased strength;Decreased mobility;Decreased activity tolerance;Decreased balance;Decreased knowledge of  use of DME;Pain       PT Treatment Interventions DME instruction;Gait training;Therapeutic activities;Therapeutic exercise;Patient/family education;Functional mobility training;Balance training    PT Goals (Current goals can be found in the Care Plan section)  Acute Rehab PT Goals Patient Stated Goal: to be better before discharging home PT Goal Formulation: With patient Time For Goal Achievement: 10/27/16 Potential to Achieve Goals: Good    Frequency Min 3X/week   Barriers to discharge        Co-evaluation               AM-PAC PT "6 Clicks" Daily Activity  Outcome Measure Difficulty turning over in bed (including adjusting bedclothes, sheets and blankets)?: None Difficulty moving from lying on back to sitting on the side of the bed? : None Difficulty sitting down on and standing up from a chair with arms (e.g., wheelchair, bedside commode, etc,.)?: None Help needed moving to and from a bed to chair (including a wheelchair)?: None Help needed walking in hospital room?: A Little Help needed climbing 3-5 steps with a railing? : A Little 6 Click Score: 22    End of Session   Activity Tolerance: Patient tolerated treatment well Patient left: in bed;with call bell/phone within reach;with bed alarm set   PT Visit Diagnosis: Muscle weakness (generalized) (M62.81);Difficulty in walking, not elsewhere classified (R26.2);Other abnormalities of gait and mobility (R26.89)    Time: [id number] PT Time Calculation (min) (ACUTE ONLY): 26 min   Charges:   PT Evaluation $PT Eval Low Complexity: 1 Low     PT G Codes:   PT G-Codes **NOT FOR INPATIENT CLASS** Functional Assessment Tool Used: AM-PAC 6 Clicks Basic Mobility;Clinical judgement Functional Limitation: Mobility: Walking and moving around Mobility: Walking and Moving Around Current Status (E0923): At least 1 percent but less than 20 percent impaired, limited or restricted Mobility: Walking and Moving Around Goal Status  [phone]): At least 1 percent but less than 20 percent impaired, limited or restricted      Weston Anna, MPT Pager: [phone]

## 2016-10-13 NOTE — Care Management Note (Signed)
Case Management Note  Patient Details  Name: [name] MRN: [id number] Date of Birth: 1927/06/24  Subjective/Objective:  83 y/o f admitted w/syncope. From home w/family. Has rw,3n1. Patient states she was to receive [facility] from prior admission & they were to start services today. Will confirm w/[facility] if they had received this admission. PT cons-await recc.                  Action/Plan:d/c home w/HHC.   Expected Discharge Date:                  Expected Discharge Plan:  [place]  In-House Referral:     Discharge planning Services  CM Consult  Post Acute Care Choice:  Durable Medical Equipment (Has rw, 3n1) Choice offered to:     DME Arranged:    DME Agency:     HH Arranged:    HH Agency:     Status of Service:  In process, will continue to follow  If discussed at Long Length of Stay Meetings, dates discussed:    Additional Comments:  Dessa Phi, RN 10/13/2016, 12:22 PM

## 2016-10-13 NOTE — Progress Notes (Signed)
Patient seen and examined.  Admission H and P reviewed.   Exam; patient alert and oriented.  CVS; S 1 S 2 RRR Lungs CTA Abdomen mild distended, no rigidity    subjective.  Patient report constipation. Only had a very small BM yesterday. She is complaining of rectal pain. She think she needs to have Bowel Movement,   Plan;   1-Syncope; exam non focal.  Continue to monitor on telemetry.  Last ECHO; 09-21-2016 EF 55 %  Clonidine on hold.  Cardiology consulted.   2-reported hematochezia;  Hb stable.  Occult blood negative.   3-Mild elevation of troponin; follow trend. Cardiology consulted.   4-Diabetes on oral medications. monitor for hypoglycemia.   CAD and HTN:  -Continue Plavix, statin -Continue amlodipine, Imdur, furosemide, metorpolol -Hold clonidine  CKD; cr  2.2 three weeks ago.  Monitor on lasix.   Constipation;  Check KUB. Will order laxative   Darlene Hummer, Md [phone]

## 2016-10-13 NOTE — Progress Notes (Signed)
Initial Nutrition Assessment  DOCUMENTATION CODES:   Non-severe (moderate) malnutrition in context of acute illness/injury  INTERVENTION:  - Will order Premier Protein once/day, this supplement provides 160 kcal, 5 grams of carb, and 30 grams of protein.  - Will order multivitamin with minerals. - Continue to encourage PO intakes.   NUTRITION DIAGNOSIS:   Malnutrition (moderate/non-severe) related to acute illness (recent stroke) as evidenced by moderate depletions of muscle mass, mild depletion of body fat.   GOAL:   Patient will meet greater than or equal to 90% of their needs  MONITOR:   PO intake, Supplement acceptance, Weight trends, Labs  REASON FOR ASSESSMENT:   Malnutrition Screening Tool  ASSESSMENT:   83 y.o. female with a past medical history significant for NIDDM, CKD, HTN, CAD s/p CABG in 2011, and recent CVA who presents with syncope.  Pt seen for MST. BMI indicates overweight status, appropriate for age. No intakes documented since admission. Pt was hospitalized/in rehab 8/23-9/8 and was mainly consuming 25-50% of meals during that time. She states that she ate very little of breakfast this AM d/t discomfort/pressure to abdomen from feeling as if she needs to have a BM. She states that she was having constipation since having her stroke at the end of August but that it has been much more severe since d/c on 9/8. Spoke with RN and she reports plan to communicate with MD as pt was requesting a particular medication, that pt cannot recall the name of, to aid with constipation.   Pt denies any nausea with or without intakes, but she does feel that abdominal pressure is affecting intakes. She also reports abdominal distention and that abdomen is tender to the touch. She denies any chewing or swallowing difficulties following stroke.   Physical assessment done to upper body only and shows mild fat wasting, moderate muscle wasting, no edema to upper body. Per chart review,  pt has lost 7 lbs (4% body weight) in <1 month which is significant for time frame.   Medications reviewed; 5 mg oral Dulcolax PRN, 40 mg oral Lasix/day, 10 mg oral Glucotrol/day, sliding scale Novolog, 1 packet Miralax/day, 1 tablet Senokot PRN. Labs reviewed; CBGs: 185 and 179 mg/dL today, BUN: 57 mg/dL, creatinine: 2.47 mg/dL, GFR: 16 mL/min.     Diet Order:  Diet heart healthy/carb modified Room service appropriate? Yes with Assist; Fluid consistency: Thin  Skin:  Reviewed, no issues  Last BM:  9/12  Height:   Ht Readings from Last 1 Encounters:  10/13/16 5\' 3"  (1.6 m)    Weight:   Wt Readings from Last 1 Encounters:  10/13/16 161 lb 9.6 oz (73.3 kg)    Ideal Body Weight:  52.27 kg  BMI:  Body mass index is 28.63 kg/m.  Estimated Nutritional Needs:   Kcal:  1465-1615 (20-22 kcal/kg)  Protein:  60-73 grams (0.8-1 grams/kg)  Fluid:  >/= 1.6 L/day  EDUCATION NEEDS:   No education needs identified at this time    Jarome Matin, MS, RD, LDN, CNSC Inpatient Clinical Dietitian Pager # [phone] After hours/weekend pager # [phone]

## 2016-10-13 NOTE — Consult Note (Signed)
Cardiology Consultation:   [name]; [id number]; Jun 20, 1927   Admit date: 10/13/2016 Date of Consult: 10/13/2016  Primary Care Provider: Deland Pretty, MD Primary Cardiologist: Hochrein Primary Electrophysiologist:  none   Patient Profile:   [name] is a 83 y.o. female with a hx of CAD who is being seen today for the evaluation of syncope at the request of Dr Loleta Books.  History of Present Illness:   [name] has a history of coronary artery disease. She has undergone CABG and PCI in the past. She had done relatively well until August of this year when she was hospitalized with a stroke. At that time she was started on Plavix. An echocardiogram was performed during that admission demonstrating normal LV function. The patient was then readmitted with chest pain in early September and she was noted to be severely hypertensive. During that admission the patient was started on isosorbide and furosemide. She was continued on amlodipine and clonidine.  The patient presents this admission with syncope. She was at home and in her normal state of health. She got up for a short walk in the home and states that her son told her she was walking unsteadily. She then collapsed to the ground and did not remember feeling dizzy or having any other prodromal symptoms. Her son helped her to the ground and she quickly regained consciousness. She did not have any seizure-like activity witnessed. She did not lose control of her bowels or bladder. She had one previous episode of syncope about 7 years ago when she sustained a major injury to her shoulder. At that time she was walking out in her garden and collapsed to the ground. She otherwise has not had problems with recurrent syncope or dizziness. She denies any recurrence of chest pain since her recent hospital admission. She denies shortness of breath, leg swelling, heart palpitations, orthopnea, or PND. In review of her records, it appears she  was recently started on clonidine. She's had a lot of problems with hypertension recently and there have been some other medication adjustments.  Past Medical History:  Diagnosis Date  . Acute renal injury ([place]) 07/2016  . Anginal pain ([place]) 2004; 2011  . CAD (coronary artery disease)    status post stenting of the RCA status post CABG. (left internal mammary artery to  LAD, saphenous vein graft to second diagonal, saphenous vein  graft to obtuse marginal 1, saphenous vein graft to posterior  descending).  . Cardiomyopathy     Mildly reduced EF (45%).   . Chronic kidney disease (CKD), stage III (moderate)   . History of blood transfusion 1960s   "related to anemia"  . HTN (hypertension)   . Myocardial infarction ([place]) 2011  . Pleural effusion 2011   S/P "bypass"  . Shortness of breath   . Stroke ([place]) 09/19/2016   "left sided weakness" (09/20/2016)  . Type II diabetes mellitus ([place])     Past Surgical History:  Procedure Laterality Date  . CATARACT EXTRACTION W/ INTRAOCULAR LENS  IMPLANT, BILATERAL Bilateral   . CORONARY ANGIOPLASTY WITH STENT PLACEMENT  2004   "LAD & one of the circumflex"  . CORONARY ARTERY BYPASS GRAFT  2011   CABG X4     Home Medications:  Prior to Admission medications   Medication Sig Start Date End Date Taking? Authorizing Provider  amLODipine (NORVASC) 10 MG tablet Take 1 tablet (10 mg total) by mouth daily. 10/06/16  Yes Angiulli, Lavon Paganini, PA-C  atorvastatin (LIPITOR) 40  MG tablet Take 1 tablet (40 mg total) by mouth daily. 10/06/16 10/06/17 Yes Angiulli, Lavon Paganini, PA-C  cholecalciferol (VITAMIN D) 400 units TABS tablet Take 1 tablet (400 Units total) by mouth daily. 10/06/16  Yes Angiulli, Lavon Paganini, PA-C  cloNIDine HCl (KAPVAY) 0.1 MG TB12 ER tablet Take 1 tablet (0.1 mg total) by mouth 2 (two) times daily. 10/08/16  Yes Geralynn Ochs T, MD  clopidogrel (PLAVIX) 75 MG tablet Take 1 tablet (75 mg total) by mouth daily. 10/06/16 10/06/17 Yes Angiulli, Lavon Paganini, PA-C    furosemide (LASIX) 40 MG tablet Take 1 tablet (40 mg total) by mouth daily. 10/08/16  Yes Lyda Jester M, PA-C  glipiZIDE (GLUCOTROL) 10 MG tablet Take 1 tablet (10 mg total) by mouth daily before breakfast. 10/06/16  Yes Angiulli, Lavon Paganini, PA-C  isosorbide mononitrate (IMDUR) 30 MG 24 hr tablet Take 1 tablet (30 mg total) by mouth daily. 10/08/16  Yes Lyda Jester M, PA-C  metoprolol tartrate (LOPRESSOR) 50 MG tablet Take 50 mg by mouth 2 (two) times daily.   Yes [provider]  nitroGLYCERIN (NITROSTAT) 0.4 MG SL tablet Place 1 tablet (0.4 mg total) under the tongue every 5 (five) minutes x 3 doses as needed for chest pain. 10/08/16  Yes Consuelo Pandy, PA-C    Inpatient Medications: Scheduled Meds: . amLODipine  10 mg Oral Daily  . atorvastatin  40 mg Oral Daily  . clopidogrel  75 mg Oral Daily  . furosemide  40 mg Oral Daily  . glipiZIDE  10 mg Oral QAC breakfast  . insulin aspart  0-9 Units Subcutaneous BID WC  . isosorbide mononitrate  30 mg Oral Daily  . metoprolol tartrate  50 mg Oral BID  . multivitamin with minerals  1 tablet Oral Daily  . polyethylene glycol  17 g Oral Daily  . protein supplement shake  11 oz Oral Q24H  . sodium chloride flush  3 mL Intravenous Q12H   Continuous Infusions:  PRN Meds: acetaminophen **OR** acetaminophen, bisacodyl, ondansetron **OR** ondansetron (ZOFRAN) IV, senna  Allergies:   No Known Allergies  Social History:   Social History   Social History  . Marital status: Married    Spouse name: N/A  . Number of children: 2  . Years of education: N/A   Occupational History  . Retired    Social History Main Topics  . Smoking status: Never Smoker  . Smokeless tobacco: Never Used  . Alcohol use No  . Drug use: No  . Sexual activity: Not on file   Other Topics Concern  . Not on file   Social History Narrative  . No narrative on file    Family History:    Family History  Problem Relation Age of Onset  .  Diabetes Father 42  . Unexplained death Mother 33  . CAD Neg Hx      ROS:  Please see the history of present illness.  ROS  Positive for constipation. Positive for gait instability. All other ROS reviewed and negative.     Physical Exam/Data:   Vitals:   10/13/16 0047 10/13/16 0202 10/13/16 0238 10/13/16 1357  BP: (!) 181/73 121/69 (!) 159/76 (!) 167/43  Pulse: (!) 105 75 79 82  Resp:  16 16 18   Temp:   98.7 F (37.1 C)   TempSrc:   Oral Oral  SpO2:  100% 100% 100%  Weight:   73.3 kg (161 lb 9.6 oz)   Height:   5\' 3"  (  1.6 m)     Intake/Output Summary (Last 24 hours) at 10/13/16 1843 Last data filed at 10/13/16 0400  Gross per 24 hour  Intake                0 ml  Output              100 ml  Net             -100 ml   Filed Weights   10/13/16 0238  Weight: 73.3 kg (161 lb 9.6 oz)   Body mass index is 28.63 kg/m.  General:  Well nourished, well developed,Delightful elderly woman in no acute distress HEENT: normal Lymph: no adenopathy Neck: no JVD Endocrine:  No thryomegaly Vascular: No carotid bruits;  Cardiac:  normal S1, S2; RRR; no murmur  Lungs:  clear to auscultation bilaterally, no wheezing, rhonchi or rales  Abd: soft, nontender, no hepatomegaly  Ext: no edema Musculoskeletal:  No deformities Skin: warm and dry  Neuro:  Mild expressive aphasia Psych:  Normal affect   EKG:  The EKG was personally reviewed and demonstrates:  Normal sinus rhythm with nonspecific ST/T-wave abnormality. No significant conduction abnormalities. Voltage criteria for LVH.  Telemetry:  Telemetry was personally reviewed and demonstrates:  Normal sinus rhythm with occasional PVCs. No bradycardia arrhythmias   Relevant CV Studies: 2-D echocardiogram 09/21/2016: Study Conclusions  - Left ventricle: The cavity size was normal. Wall thickness was   increased in a pattern of mild LVH. Systolic function was normal.   The estimated ejection fraction was in the range of 55% to 60%.    Wall motion was normal; there were no regional wall motion   abnormalities. Doppler parameters are consistent with abnormal   left ventricular relaxation (grade 1 diastolic dysfunction). - Mitral valve: There was mild regurgitation. - Left atrium: The atrium was mildly dilated. - Atrial septum: No defect or patent foramen ovale was identified.  Impressions:  - No cardiac source of emboli was indentified.  Laboratory Data:  Chemistry Recent Labs Lab 10/08/16 0352 10/13/16 0008 10/13/16 0525  NA 133* 134* 140  K 4.2 4.6 4.7  CL 103 101 106  CO2 20* 20* 24  GLUCOSE 125* 164* 159*  BUN 57* 55* 57*  CREATININE 2.75* 2.57* 2.47*  CALCIUM 9.6 10.5* 10.3  GFRNONAA 14* 16* 16*  GFRAA 17* 18* 19*  ANIONGAP 10 13 10      Recent Labs Lab 10/13/16 0525  PROT 7.7  ALBUMIN 4.0  AST 15  ALT 17  ALKPHOS 65  BILITOT 0.8   Hematology Recent Labs Lab 10/06/16 1904 10/13/16 0008 10/13/16 0525  WBC 5.8 7.4 6.9  RBC 3.80* 4.13 3.92  HGB 11.1* 12.3 11.3*  HCT 33.8* 36.5 34.1*  MCV 88.9 88.4 87.0  MCH 29.2 29.8 28.8  MCHC 32.8 33.7 33.1  RDW 14.5 13.9 14.0  PLT 257 282 252   Cardiac Enzymes Recent Labs Lab 10/06/16 1904 10/07/16 0250 10/07/16 0438 10/13/16 0026 10/13/16 0525  TROPONINI <0.03 <0.03 <0.03 0.03* 0.03*   No results for input(s): TROPIPOC in the last 168 hours.  BNPNo results for input(s): BNP, PROBNP in the last 168 hours.  DDimer No results for input(s): DDIMER in the last 168 hours.  Radiology/Studies:  Dg Abd 1 View  Result Date: 10/13/2016 CLINICAL DATA:  Acute generalized abdominal pain and distention. EXAM: ABDOMEN - 1 VIEW COMPARISON:  CT scan of Jun 30, 2011. FINDINGS: No abnormal bowel dilatation is noted. Large amount of stool seen  throughout the colon and rectum, with possible rectal impaction. Vascular calcifications and phleboliths are noted. IMPRESSION: Large stool burden is noted, with possible rectal impaction. No abnormal bowel  dilatation is noted. Electronically Signed   By: Marijo Conception, M.D.   On: 10/13/2016 16:32    Assessment and Plan:   1. Syncope: Somewhat unclear etiology but suspect orthostasis is the most likely etiology in this elderly patient on multiple antihypertensive drugs. She has a wide pulse pressure and requires multi drug therapy to maintain control of severe systemic hypertension. I agree with holding clonidine and probably discontinuing this medication altogether. She will remain on furosemide, isosorbide, metoprolol, and amlodipine. Even with her stroke history will likely have to tolerate a systolic blood pressure around 160 mmHg. There is no evidence for significant arrhythmia based on review of her telemetry. LV function is normal on recent echocardiogram and there is no significant valvular disease with the exception of mild mitral regurgitation. No further evaluation is indicated at this time. If the patient has recurrent syncope despite the change in her antihypertensive regimen, would place an event monitor as an outpatient.   2. Coronary artery disease native vessel: No anginal symptoms since her recent hospitalization with chest pain. Continue isosorbide and clopidogrel. Troponin is negative this admission.   3. Hypertension, uncontrolled: Would continue combination of metoprolol, amlodipine, furosemide, and isosorbide.  4. Chronic kidney disease stage IV: Followed by nephrology as an outpatient. Creatinine is stable over the past few weeks based on review of her laboratory data.  The patient has a follow-up appointment scheduled with Dr. Percival Spanish 11/23/2016. If any other problems arise please feel free to call, otherwise will sign off at this time as no other cardiac evaluation is planned.  For questions or updates, please contact [facility] Please consult [email or web address] for contact info under Cardiology/STEMI.   Deatra James, MD  10/13/2016 6:43 PM

## 2016-10-14 DIAGNOSIS — R55 Syncope and collapse: Secondary | ICD-10-CM | POA: Diagnosis not present

## 2016-10-14 DIAGNOSIS — R748 Abnormal levels of other serum enzymes: Secondary | ICD-10-CM | POA: Diagnosis not present

## 2016-10-14 DIAGNOSIS — K921 Melena: Secondary | ICD-10-CM | POA: Diagnosis not present

## 2016-10-14 DIAGNOSIS — I2510 Atherosclerotic heart disease of native coronary artery without angina pectoris: Secondary | ICD-10-CM | POA: Diagnosis not present

## 2016-10-14 LAB — BASIC METABOLIC PANEL
Anion gap: 9 (ref 5–15)
BUN: 45 mg/dL — AB (ref 6–20)
CALCIUM: 10.3 mg/dL (ref 8.9–10.3)
CO2: 24 mmol/L (ref 22–32)
CREATININE: 2.33 mg/dL — AB (ref 0.44–1.00)
Chloride: 101 mmol/L (ref 101–111)
GFR calc non Af Amer: 18 mL/min — ABNORMAL LOW (ref >60)
GFR, EST AFRICAN AMERICAN: 20 mL/min — AB (ref >60)
Glucose, Bld: 191 mg/dL — ABNORMAL HIGH (ref 65–99)
Potassium: 5 mmol/L (ref 3.5–5.1)
Sodium: 134 mmol/L — ABNORMAL LOW (ref 135–145)

## 2016-10-14 LAB — GLUCOSE, CAPILLARY
GLUCOSE-CAPILLARY: 194 mg/dL — AB (ref 65–99)
GLUCOSE-CAPILLARY: 187 mg/dL — AB (ref 65–99)
Glucose-Capillary: 137 mg/dL — ABNORMAL HIGH (ref 65–99)
Glucose-Capillary: 175 mg/dL — ABNORMAL HIGH (ref 65–99)

## 2016-10-14 LAB — HEMOGLOBIN AND HEMATOCRIT, BLOOD
HCT: 35.5 % — ABNORMAL LOW (ref 36.0–46.0)
HEMOGLOBIN: 11.8 g/dL — AB (ref 12.0–15.0)

## 2016-10-14 LAB — MAGNESIUM: MAGNESIUM: 2.6 mg/dL — AB (ref 1.7–2.4)

## 2016-10-14 MED ORDER — SODIUM CHLORIDE 0.9 % IV BOLUS (SEPSIS): 250.0000 mL | Freq: Once | INTRAVENOUS | Status: AC

## 2016-10-14 MED ORDER — SODIUM CHLORIDE 0.9 % IV SOLN
INTRAVENOUS | Status: DC
  Administered 2016-10-14 – 2016-10-15 (×2): via INTRAVENOUS

## 2016-10-14 MED ORDER — SORBITOL 70 % SOLN
960.0000 mL | TOPICAL_OIL | Freq: Once | ORAL | Status: AC
  Administered 2016-10-14: 960 mL via RECTAL
  Filled 2016-10-14: qty 240

## 2016-10-14 NOTE — Care Management Note (Signed)
Case Management Note  Patient Details  Name: [name] MRN: [id number] Date of Birth: Nov 10, 1927  Subjective/Objective:  HHPT/OT orders placed-Already active w/[facility]-rep Santiago Glad aware of orders & awaiting d/c. No further CM needs.                  Action/Plan:d/c home w/HHC.   Expected Discharge Date:                  Expected Discharge Plan:  [place]  In-House Referral:     Discharge planning Services  CM Consult  Post Acute Care Choice:  Durable Medical Equipment, Home Health (Has rw, 3n1;Active [facility] w/HHPT/OT) Choice offered to:  Patient  DME Arranged:    DME Agency:     HH Arranged:  PT, OT HH Agency:  [facility]  Status of Service:  Completed, signed off  If discussed at [place] of Stay Meetings, dates discussed:    Additional Comments:  Dessa Phi, RN 10/14/2016, 12:47 PM

## 2016-10-14 NOTE — Progress Notes (Signed)
PROGRESS NOTE    [name]  TSV:[id number] DOB: [birth year] DOA: 10/13/2016 PCP: Deland Pretty, MD    Brief Narrative: [name] is a 83 y.o. female with a past medical history significant for NIDDM, CKD, HTN, CAD s/p CABG in 2011, and recent CVA who presents with syncope.  The patient was in her usual state of health (lived independently, active, still driving) until August a few weeks ago when she had a stroke.  Since then she was in rehab, was started on Plavix, and had rehabbed well.  She did have a readmission briefly for chest pain, had serially negative troponins, did not undergo cath or stress testing, was started on Imdur for angina.  Since then she has been back home, daughter with her some during the day, a nephew at night.  Walking with a walker.  Tonight, she was walking from her bedroom into the living room when she collapsed.  She felt no prodrome, nausea, dizziness, warmth, dyspnea chest pain or palpitations.  Her son (who is a physician) was there and saw her collapsing and lowered her into a chair then onto the ground and held her legs above her head.  She thinks she lost consciousness then slowly came back. Son then brought her to the ER.  At some point, he checked her Depends and saw what appeared to be bright red blood.  ED course: -Afebrile, heart rate 68, respirations and pulse ox normal, BP 139/72 -Na 134, K 4.6, Cr 2.57 (baseline 2-2.7 since this last hospitalization), WBC 7.4K, Hgb 12.3 (increased from discahrge) -Troponin 0.03 -UA wtihout pyuria or hematuria -Rectal exam showed brown stool and was heme negative -Her orthostatics were normal -[facility] were asked to evaluate for syncope    Assessment & Plan:   Principal Problem:   Syncope Active Problems:   Essential hypertension   Coronary atherosclerosis   Diabetes mellitus with complication (HCC)   Stage 3 chronic kidney disease   Rectal bleed   Malnutrition of moderate degree   1-Syncope; exam  non focal.  Continue to monitor on telemetry.  Last ECHO; 09-21-2016 EF 55 %  Clonidine on hold.  Cardiology consulted. Syncope thought to be related to hypotension.   Patient feels lightheaded, EKG sinus rhythm. Reviewed EKG with cardiology.  Will check CBC, BMET  Will give IV bolus. Fluids.   2-reported hematochezia;  Hb stable.  Occult blood negative.  Repeat hb today.   3-Mild elevation of troponin; follow trend. Cardiology consulted. No further evaluation   4-Diabetes on oral medications. monitor for hypoglycemia.   CAD and HTN: -Continue Plavix, statin -Continue amlodipine, Imdur, furosemide, metorpolol -Hold clonidine  CKD; cr  2.2 three weeks ago.  Monitor on lasix.  Repeat bmet   Constipation; fecal impaction.  KUB with large amount of stool. Plan for SMOG.     DVT prophylaxis: SCD Code Status: Full code.  Family Communication: care discussed with patient  Disposition Plan: home in 24 hours.   Consultants:   Cardiology    Procedures: none   Antimicrobials: none   Subjective: Patient was doing well, this morning. She just received enema. After enema, and large multiple BM she is feeling sick. She feels she is going to faint.  denies chest pain.   Objective: Vitals:   10/13/16 1357 10/13/16 2002 10/14/16 0522 10/14/16 1334  BP: (!) 167/43 (!) 167/74 (!) 165/80 (!) 157/73  Pulse: 82 93 81 95  Resp: 18 18 18 16   Temp:  98.8 F (37.1 C) 97.7 F (  36.5 C) 97.8 F (36.6 C)  TempSrc: Oral Oral Oral Oral  SpO2: 100% 100% 98% 98%  Weight:   76.8 kg (169 lb 5 oz)   Height:        Intake/Output Summary (Last 24 hours) at 10/14/16 1429 Last data filed at 10/14/16 0534  Gross per 24 hour  Intake                0 ml  Output              900 ml  Net             -900 ml   Filed Weights   10/13/16 0238 10/14/16 0522  Weight: 73.3 kg (161 lb 9.6 oz) 76.8 kg (169 lb 5 oz)    Examination:  General exam: Appears calm and comfortable    Respiratory system: Clear to auscultation. Respiratory effort normal. Cardiovascular system: S1 & S2 heard, RRR. No JVD, murmurs, rubs, gallops or clicks. No pedal edema. Gastrointestinal system: Abdomen is nondistended, soft and nontender. No organomegaly or masses felt. Normal bowel sounds heard. Central nervous system: Alert and oriented. No focal neurological deficits. Extremities: Symmetric 5 x 5 power. Skin: No rashes, lesions or ulcers Psychiatry: Judgement and insight appear normal. Mood & affect appropriate.     Data Reviewed: I have personally reviewed following labs and imaging studies  CBC:  Recent Labs Lab 10/13/16 0008 10/13/16 0525  WBC 7.4 6.9  NEUTROABS 4.7  --   HGB 12.3 11.3*  HCT 36.5 34.1*  MCV 88.4 87.0  PLT 282 948   Basic Metabolic Panel:  Recent Labs Lab 10/08/16 0352 10/13/16 0008 10/13/16 0525  NA 133* 134* 140  K 4.2 4.6 4.7  CL 103 101 106  CO2 20* 20* 24  GLUCOSE 125* 164* 159*  BUN 57* 55* 57*  CREATININE 2.75* 2.57* 2.47*  CALCIUM 9.6 10.5* 10.3   GFR: Estimated Creatinine Clearance: 15.8 mL/min (A) (by C-G formula based on SCr of 2.47 mg/dL (H)). Liver Function Tests:  Recent Labs Lab 10/13/16 0525  AST 15  ALT 17  ALKPHOS 65  BILITOT 0.8  PROT 7.7  ALBUMIN 4.0   No results for input(s): LIPASE, AMYLASE in the last 168 hours. No results for input(s): AMMONIA in the last 168 hours. Coagulation Profile: No results for input(s): INR, PROTIME in the last 168 hours. Cardiac Enzymes:  Recent Labs Lab 10/13/16 0026 10/13/16 0525  TROPONINI 0.03* 0.03*   BNP (last 3 results) No results for input(s): PROBNP in the last 8760 hours. HbA1C: No results for input(s): HGBA1C in the last 72 hours. CBG:  Recent Labs Lab 10/13/16 1156 10/13/16 1701 10/14/16 0809 10/14/16 1155 10/14/16 1403  GLUCAP 166* 142* 194* 137* 187*   Lipid Profile: No results for input(s): CHOL, HDL, LDLCALC, TRIG, CHOLHDL, LDLDIRECT in the  last 72 hours. Thyroid Function Tests: No results for input(s): TSH, T4TOTAL, FREET4, T3FREE, THYROIDAB in the last 72 hours. Anemia Panel: No results for input(s): VITAMINB12, FOLATE, FERRITIN, TIBC, IRON, RETICCTPCT in the last 72 hours. Sepsis Labs: No results for input(s): PROCALCITON, LATICACIDVEN in the last 168 hours.  No results found for this or any previous visit (from the past 240 hour(s)).       Radiology Studies: Dg Abd 1 View  Result Date: 10/13/2016 CLINICAL DATA:  Acute generalized abdominal pain and distention. EXAM: ABDOMEN - 1 VIEW COMPARISON:  CT scan of Jun 30, 2011. FINDINGS: No abnormal bowel dilatation is noted. Large  amount of stool seen throughout the colon and rectum, with possible rectal impaction. Vascular calcifications and phleboliths are noted. IMPRESSION: Large stool burden is noted, with possible rectal impaction. No abnormal bowel dilatation is noted. Electronically Signed   By: Marijo Conception, M.D.   On: 10/13/2016 16:32        Scheduled Meds: . amLODipine  10 mg Oral Daily  . atorvastatin  40 mg Oral Daily  . clopidogrel  75 mg Oral Daily  . furosemide  40 mg Oral Daily  . glipiZIDE  10 mg Oral QAC breakfast  . insulin aspart  0-9 Units Subcutaneous BID WC  . isosorbide mononitrate  30 mg Oral Daily  . metoprolol tartrate  50 mg Oral BID  . multivitamin with minerals  1 tablet Oral Daily  . polyethylene glycol  17 g Oral Daily  . protein supplement shake  11 oz Oral Q24H  . senna-docusate  1 tablet Oral BID  . sodium chloride flush  3 mL Intravenous Q12H   Continuous Infusions: . sodium chloride 50 mL/hr at 10/14/16 1408  . sodium chloride 250 mL (10/14/16 1431)     LOS: 0 days    Time spent: 35 minutes     Elmarie Shiley, MD [facility] Pager [phone]  If 7PM-7AM, please contact night-coverage [email or web address] Password [facility] 10/14/2016, 2:29 PM

## 2016-10-14 NOTE — Progress Notes (Addendum)
Pt received SMOG enema. Pt enema was given pt complained of being nauseated and feeling "faint and weak.". Pt refused Zofran for a hour and then finally agreed to take it. After she received the Zofran pt stated " I feel better with the nausea, but I still feel faint" Vital sign stable, CBG  187, EKG done. MD notified and came to bedside to assess pt. New orders placed in Epic by MD and followed out by Probation officer.

## 2016-10-14 NOTE — Progress Notes (Signed)
Pt with poor PO intake and vomiting.

## 2016-10-14 NOTE — Progress Notes (Signed)
Physical Therapy Treatment Patient Details Name: [name] MRN: [id number] DOB: 10-01-1927 Today's Date: 10/14/2016    History of Present Illness 83 yo female admitted after having syncope and collapse at home. Recent R posterior limb/thalamus CVA 08/2016-pt completed CIR, DM, CKD, CABG, MI, CAD.     PT Comments    Pt requesting bathroom and return to bed.  Pt initially stood however returned to recliner, felt stool was coming quickly so BSC provided instead.  Pt unable to have BM.  Pt able to perform pericare with occasional min assist for steadying with transfers.  Pt declined ambulating and requested return to bed.   Follow Up Recommendations  Home health PT     Equipment Recommendations  None recommended by PT    Recommendations for Other Services       Precautions / Restrictions Precautions Precautions: Fall Precaution Comments: residual L sided weakness    Mobility  Bed Mobility Overal bed mobility: Needs Assistance Bed Mobility: Supine to Sit;Sit to Supine     Supine to sit: Supervision;HOB elevated Sit to supine: Supervision;HOB elevated   General bed mobility comments: for safety  Transfers Overall transfer level: Needs assistance Equipment used: Rolling walker (2 wheeled) Transfers: Sit to/from [facility] Sit to Stand: Min guard Stand pivot transfers: Min assist       General transfer comment: close guard for safety. verbal cues for self assist, min assist for occasional steadying, pt transferred from recliner to [facility] to bed  Ambulation/Gait             General Gait Details: pt declined ambulating despite encouragement, reports fear of falling   Stairs            Wheelchair Mobility    Modified Rankin (Stroke Patients Only)       Balance                                            Cognition Arousal/Alertness: Awake/alert Behavior During Therapy: WFL for tasks assessed/performed Overall  Cognitive Status: Within Functional Limits for tasks assessed                                        Exercises      General Comments        Pertinent Vitals/Pain Pain Assessment: Faces Faces Pain Scale: Hurts little more Pain Location: abdominal discomfort Pain Descriptors / Indicators: Sore;Discomfort Pain Intervention(s): Limited activity within patient's tolerance;Monitored during session;Repositioned    Home Living                      Prior Function            PT Goals (current goals can now be found in the care plan section) Progress towards PT goals: Progressing toward goals    Frequency    Min 3X/week      PT Plan Current plan remains appropriate    Co-evaluation              AM-PAC PT "6 Clicks" Daily Activity  Outcome Measure  Difficulty turning over in bed (including adjusting bedclothes, sheets and blankets)?: None Difficulty moving from lying on back to sitting on the side of the bed? : None Difficulty sitting down on and standing up from a  chair with arms (e.g., wheelchair, bedside commode, etc,.)?: None Help needed moving to and from a bed to chair (including a wheelchair)?: A Little Help needed walking in hospital room?: A Little Help needed climbing 3-5 steps with a railing? : A Little 6 Click Score: 21    End of Session   Activity Tolerance: Patient tolerated treatment well Patient left: in bed;with call bell/phone within reach Nurse Communication: Mobility status PT Visit Diagnosis: Muscle weakness (generalized) (M62.81);Difficulty in walking, not elsewhere classified (R26.2);Other abnormalities of gait and mobility (R26.89)     Time: 2878-6767 PT Time Calculation (min) (ACUTE ONLY): 13 min  Charges:  $Therapeutic Activity: 8-22 mins                    G Codes:     Carmelia Bake, PT, DPT 10/14/2016 Pager: [phone]    York Ram E 10/14/2016, 1:43 PM

## 2016-10-15 DIAGNOSIS — R55 Syncope and collapse: Secondary | ICD-10-CM | POA: Diagnosis not present

## 2016-10-15 LAB — GLUCOSE, CAPILLARY
GLUCOSE-CAPILLARY: 86 mg/dL (ref 65–99)
GLUCOSE-CAPILLARY: 164 mg/dL — AB (ref 65–99)

## 2016-10-15 MED ORDER — PREMIER PROTEIN SHAKE: 11.0000 [oz_av] | ORAL | 0 refills | Status: DC

## 2016-10-15 MED ORDER — SENNA 8.6 MG PO TABS: 1.0000 | ORAL_TABLET | Freq: Every day | ORAL | 0 refills | Status: DC

## 2016-10-15 MED ORDER — POLYETHYLENE GLYCOL 3350 17 G PO PACK: 17.0000 g | PACK | Freq: Every day | ORAL | 0 refills | Status: DC

## 2016-10-15 MED ORDER — SENNA 8.6 MG PO TABS: 1.0000 | ORAL_TABLET | Freq: Every day | ORAL | Status: DC

## 2016-10-15 NOTE — Discharge Summary (Addendum)
Physician Discharge Summary  [name] ENI:[id number] DOB: [birth year] DOA: 10/13/2016  PCP: Deland Pretty, MD  Admit date: 10/13/2016 Discharge date: 10/15/2016  Admitted From: Home  Disposition:  Home   Recommendations for Outpatient Follow-up:  1. Follow up with PCP in 1-2 weeks 2. Please obtain BMP/CBC in one week 3. Monitor Blood pressure.  4. Needs to follow up with Primary cardiologist    Home Health: yes.   Discharge Condition; Stable.  CODE STATUS: Full code.  Diet recommendation: Heart Healthy   Brief/Interim Summary: [name] a 83 y.o.femalewith a past medical history significant for NIDDM, CKD, HTN, CAD s/p CABG in 2011, and recent CVAwho presents with syncope.  The patient was in her usual state of health (lived independently, active, still driving) until August a few weeks ago when she had a stroke. Since then she was in rehab, was started on Plavix, and had rehabbed well. She did have a readmission briefly for chest pain, had serially negative troponins, did not undergo cath or stress testing, was started on Imdur for angina.  Since then she has been back home, daughter with her some during the day, a nephew at night. Walking with a walker. Tonight, she was walking from her bedroom into the living room when she collapsed. She felt no prodrome, nausea, dizziness, warmth, dyspnea chest pain or palpitations. Her son (who is a physician) was there and saw her collapsing and lowered her into a chair then onto the ground and held her legs above her head. She thinks she lost consciousness then slowly came back. Son then brought her to the ER. At some point, he checked her Depends and saw what appeared to be bright red blood.  ED course: -Afebrile, heart rate 68, respirations and pulse ox normal, BP 139/72 -Na 134, K 4.6, Cr 2.57(baseline 2-2.7 since this last hospitalization), WBC 7.4K, Hgb 12.3 (increased from discahrge) -Troponin 0.03 -UA wtihout pyuria  or hematuria -Rectal exam showed brown stool and was heme negative -Herorthostatics were normal -[facility] were asked to evaluate for syncope   1-Syncope;exam non focal.  Continue to monitor on telemetry.  Last ECHO; 09-21-2016 EF 55 %  Clonidine on hold.  Cardiology consulted. Syncope thought to be related to hypotension.  No arrhythmia on telemetry.  Patient had an episode of  lightheaded, 9-14 EKG sinus rhythm. Reviewed EKG with cardiology. Suspect related to vasovagal after enema. She is feeling better today.. No dizziness, no nausea.   2-reported hematochezia;  Hb stable.  Occult blood negative.  Hb stable.   3-Mild elevation of troponin; follow trend. Cardiology consulted. No further evaluation   4-Diabetes on oral medications. monitor for hypoglycemia.   CAD and HTN: -Continue Plavix, statin -Continue amlodipine, Imdur, furosemide, metorpolol -Hold clonidine  CKD; cr 2.2 three weeks ago.  Monitor on lasix.  Cr stable.   Constipation; fecal impaction.  KUB with large amount of stool. Received  SMOG.  Had multiples BM.  Needs schedule laxatives.   Discharge Diagnoses:  Principal Problem:   Syncope Active Problems:   Essential hypertension   Coronary atherosclerosis   Diabetes mellitus with complication (HCC)   Stage 3 chronic kidney disease   Rectal bleed   Malnutrition of moderate degree    Discharge Instructions  Discharge Instructions    Diet - low sodium heart healthy    Complete by:  As directed    Increase activity slowly    Complete by:  As directed      Allergies as of 10/15/2016  No Known Allergies     Medication List    STOP taking these medications   cloNIDine HCl 0.1 MG Tb12 ER tablet Commonly known as:  KAPVAY     TAKE these medications   amLODipine 10 MG tablet Commonly known as:  NORVASC Take 1 tablet (10 mg total) by mouth daily.   atorvastatin 40 MG tablet Commonly known as:  LIPITOR Take 1 tablet (40 mg total)  by mouth daily.   cholecalciferol 400 units Tabs tablet Commonly known as:  VITAMIN D Take 1 tablet (400 Units total) by mouth daily.   clopidogrel 75 MG tablet Commonly known as:  PLAVIX Take 1 tablet (75 mg total) by mouth daily.   furosemide 40 MG tablet Commonly known as:  LASIX Take 1 tablet (40 mg total) by mouth daily.   glipiZIDE 10 MG tablet Commonly known as:  GLUCOTROL Take 1 tablet (10 mg total) by mouth daily before breakfast.   isosorbide mononitrate 30 MG 24 hr tablet Commonly known as:  IMDUR Take 1 tablet (30 mg total) by mouth daily.   metoprolol tartrate 50 MG tablet Commonly known as:  LOPRESSOR Take 50 mg by mouth 2 (two) times daily.   nitroGLYCERIN 0.4 MG SL tablet Commonly known as:  NITROSTAT Place 1 tablet (0.4 mg total) under the tongue every 5 (five) minutes x 3 doses as needed for chest pain.   polyethylene glycol packet Commonly known as:  MIRALAX / GLYCOLAX Take 17 g by mouth daily.   protein supplement shake Liqd Commonly known as:  PREMIER PROTEIN Take 325 mLs (11 oz total) by mouth daily.   senna 8.6 MG Tabs tablet Commonly known as:  SENOKOT Take 1 tablet (8.6 mg total) by mouth daily.            Durable Medical Equipment        Start     Ordered   10/15/16 1534  For home use only DME Walker  Once    Question:  Patient needs a walker to treat with the following condition  Answer:  Balance problems   10/15/16 1534       Discharge Care Instructions        Start     Ordered   10/16/16 0000  polyethylene glycol (MIRALAX / GLYCOLAX) packet  Daily     10/15/16 1119   10/16/16 0000  senna (SENOKOT) 8.6 MG TABS tablet  Daily     10/15/16 1119   10/15/16 0000  protein supplement shake (PREMIER PROTEIN) LIQD  Every 24 hours     10/15/16 1119   10/15/16 0000  Increase activity slowly     10/15/16 1119   10/15/16 0000  Diet - low sodium heart healthy     10/15/16 1119     Follow-up Information    [facility].   Why:  [facility] physical/occupational therapy Contact information: [address] [place] [address] [phone]          No Known Allergies  Consultations:  Cardiology    Procedures/Studies: Dg Chest 2 View  Result Date: 10/06/2016 CLINICAL DATA:  Chest pain and chills EXAM: CHEST  2 VIEW COMPARISON:  May 14, 2010 FINDINGS: There is mild scarring in the left base. There is no edema or consolidation. Heart is upper normal in size with pulmonary vascularity within normal limits. No adenopathy. Patient is status post coronary artery bypass grafting. There is aortic atherosclerosis. Note that there is no longer  elevation of the left hemidiaphragm. There is remodeling from an old healed fracture of the proximal right humerus. IMPRESSION: Mild left base atelectasis. No edema or consolidation. Heart upper normal in size. There is aortic atherosclerosis. Patient is status post coronary artery bypass grafting. Aortic Atherosclerosis (ICD10-I70.0). Electronically Signed   By: Lowella Grip III M.D.   On: 10/06/2016 11:20   Dg Abd 1 View  Result Date: 10/13/2016 CLINICAL DATA:  Acute generalized abdominal pain and distention. EXAM: ABDOMEN - 1 VIEW COMPARISON:  CT scan of Jun 30, 2011. FINDINGS: No abnormal bowel dilatation is noted. Large amount of stool seen throughout the colon and rectum, with possible rectal impaction. Vascular calcifications and phleboliths are noted. IMPRESSION: Large stool burden is noted, with possible rectal impaction. No abnormal bowel dilatation is noted. Electronically Signed   By: Marijo Conception, M.D.   On: 10/13/2016 16:32   Ct Head Wo Contrast  Result Date: 09/20/2016 CLINICAL DATA:  Left arm and leg weakness since 7 p.m. EXAM: CT HEAD WITHOUT CONTRAST TECHNIQUE: Contiguous axial images were obtained from the base of the skull through the vertex without intravenous contrast. COMPARISON:  05/13/2010 FINDINGS: Brain: No evidence of  acute infarction, hemorrhage, extra-axial collection, ventriculomegaly, or mass effect. Generalized cerebral atrophy. Periventricular white matter low attenuation likely secondary to microangiopathy. Vascular: Cerebrovascular atherosclerotic calcifications are noted. Skull: Negative for fracture or focal lesion. Sinuses/Orbits: Visualized portions of the orbits are unremarkable. Visualized portions of the paranasal sinuses and mastoid air cells are unremarkable. Other: None. IMPRESSION: 1. No acute intracranial pathology. Electronically Signed   By: Kathreen Devoid   On: 09/20/2016 13:43   Mr Jodene Nam Neck Wo Contrast  Result Date: 09/20/2016 CLINICAL DATA:  83 y/o F; left arm and left leg weakness with decreased sensation. EXAM: MRI HEAD WITHOUT CONTRAST MRA HEAD WITHOUT CONTRAST MRA NECK WITHOUT CONTRAST TECHNIQUE: Multiplanar, multiecho pulse sequences of the brain and surrounding structures were obtained without intravenous contrast. Angiographic images of the Circle of Willis were obtained using MRA technique without intravenous contrast. Angiographic images of the neck were obtained using MRA technique without intravenous contrast. Carotid stenosis measurements (when applicable) are obtained utilizing NASCET criteria, using the distal internal carotid diameter as the denominator. COMPARISON:  09/20/2016 CT head FINDINGS: MRI HEAD FINDINGS Brain: 11 x 7 mm focus of reduced diffusion within the right posterior limb of internal capsule and lateral thalamus (series 4, image 24 in series 405, image 24) compatible with acute/ early subacute infarction. The infarction demonstrates T2 FLAIR hyperintense signal abnormality. There is no evidence for hemorrhage or mass effect. Patchy confluent nonspecific foci of T2 FLAIR hyperintense signal abnormality in subcortical and periventricular white matter is compatible with advanced chronic microvascular ischemic changes for age and there is diffuse brain parenchymal atrophy.  Small cortical infarction is present in the right posterior frontal cortex and there is a chronic lacunar infarction within the left thalamus. Vascular: As below. Skull and upper cervical spine: Normal marrow signal. Sinuses/Orbits: No abnormal signal of paranasal sinuses or mastoid air cells. Bilateral intra-ocular lens replacement. Other: None. MRA HEAD FINDINGS Anterior circulation: No large vessel occlusion, aneurysm, or significant stenosis is identified. Mild lumen irregularity of carotid siphons with mild distal left cavernous stenosis is compatible with atherosclerotic changes. Posterior circulation: Codominant vertebrobasilar system. No large vessel occlusion, aneurysm, or significant stenosis is identified. Anatomic variation: Diminutive posterior communicating arteries and large anterior communicating artery are present. MRA NECK FINDINGS Right carotid system: No aneurysm or hemodynamically significant stenosis by  NASCET criteria is identified. Left carotid system: No aneurysm or hemodynamically significant stenosis by NASCET criteria is identified. Vertebrobasilar system: No dissection, aneurysm, or significant stenosis is identified. IMPRESSION: 1. 11 mm acute/early subacute infarction involving right posterior limb of internal capsule and lateral thalamus. No acute hemorrhage or mass effect. 2. Advanced chronic microvascular ischemic changes and parenchymal volume loss of the brain. 3. Patent carotid and vertebral arteries. No aneurysm or hemodynamically significant stenosis utilizing NASCET criteria. 4. Patent circle of Willis. No large vessel occlusion, aneurysm, or significant stenosis. These results will be called to the ordering clinician or representative by the Radiologist Assistant, and communication documented in the PACS or zVision Dashboard. Electronically Signed   By: Kristine Garbe M.D.   On: 09/20/2016 22:43   Mr Brain Wo Contrast  Result Date: 09/20/2016 CLINICAL DATA:  83  y/o F; left arm and left leg weakness with decreased sensation. EXAM: MRI HEAD WITHOUT CONTRAST MRA HEAD WITHOUT CONTRAST MRA NECK WITHOUT CONTRAST TECHNIQUE: Multiplanar, multiecho pulse sequences of the brain and surrounding structures were obtained without intravenous contrast. Angiographic images of the Circle of Willis were obtained using MRA technique without intravenous contrast. Angiographic images of the neck were obtained using MRA technique without intravenous contrast. Carotid stenosis measurements (when applicable) are obtained utilizing NASCET criteria, using the distal internal carotid diameter as the denominator. COMPARISON:  09/20/2016 CT head FINDINGS: MRI HEAD FINDINGS Brain: 11 x 7 mm focus of reduced diffusion within the right posterior limb of internal capsule and lateral thalamus (series 4, image 24 in series 405, image 24) compatible with acute/ early subacute infarction. The infarction demonstrates T2 FLAIR hyperintense signal abnormality. There is no evidence for hemorrhage or mass effect. Patchy confluent nonspecific foci of T2 FLAIR hyperintense signal abnormality in subcortical and periventricular white matter is compatible with advanced chronic microvascular ischemic changes for age and there is diffuse brain parenchymal atrophy. Small cortical infarction is present in the right posterior frontal cortex and there is a chronic lacunar infarction within the left thalamus. Vascular: As below. Skull and upper cervical spine: Normal marrow signal. Sinuses/Orbits: No abnormal signal of paranasal sinuses or mastoid air cells. Bilateral intra-ocular lens replacement. Other: None. MRA HEAD FINDINGS Anterior circulation: No large vessel occlusion, aneurysm, or significant stenosis is identified. Mild lumen irregularity of carotid siphons with mild distal left cavernous stenosis is compatible with atherosclerotic changes. Posterior circulation: Codominant vertebrobasilar system. No large vessel  occlusion, aneurysm, or significant stenosis is identified. Anatomic variation: Diminutive posterior communicating arteries and large anterior communicating artery are present. MRA NECK FINDINGS Right carotid system: No aneurysm or hemodynamically significant stenosis by NASCET criteria is identified. Left carotid system: No aneurysm or hemodynamically significant stenosis by NASCET criteria is identified. Vertebrobasilar system: No dissection, aneurysm, or significant stenosis is identified. IMPRESSION: 1. 11 mm acute/early subacute infarction involving right posterior limb of internal capsule and lateral thalamus. No acute hemorrhage or mass effect. 2. Advanced chronic microvascular ischemic changes and parenchymal volume loss of the brain. 3. Patent carotid and vertebral arteries. No aneurysm or hemodynamically significant stenosis utilizing NASCET criteria. 4. Patent circle of Willis. No large vessel occlusion, aneurysm, or significant stenosis. These results will be called to the ordering clinician or representative by the Radiologist Assistant, and communication documented in the PACS or zVision Dashboard. Electronically Signed   By: Kristine Garbe M.D.   On: 09/20/2016 22:43   Mr Jodene Nam Head Wo Contrast  Result Date: 09/20/2016 CLINICAL DATA:  83 y/o F; left arm and  left leg weakness with decreased sensation. EXAM: MRI HEAD WITHOUT CONTRAST MRA HEAD WITHOUT CONTRAST MRA NECK WITHOUT CONTRAST TECHNIQUE: Multiplanar, multiecho pulse sequences of the brain and surrounding structures were obtained without intravenous contrast. Angiographic images of the Circle of Willis were obtained using MRA technique without intravenous contrast. Angiographic images of the neck were obtained using MRA technique without intravenous contrast. Carotid stenosis measurements (when applicable) are obtained utilizing NASCET criteria, using the distal internal carotid diameter as the denominator. COMPARISON:  09/20/2016 CT  head FINDINGS: MRI HEAD FINDINGS Brain: 11 x 7 mm focus of reduced diffusion within the right posterior limb of internal capsule and lateral thalamus (series 4, image 24 in series 405, image 24) compatible with acute/ early subacute infarction. The infarction demonstrates T2 FLAIR hyperintense signal abnormality. There is no evidence for hemorrhage or mass effect. Patchy confluent nonspecific foci of T2 FLAIR hyperintense signal abnormality in subcortical and periventricular white matter is compatible with advanced chronic microvascular ischemic changes for age and there is diffuse brain parenchymal atrophy. Small cortical infarction is present in the right posterior frontal cortex and there is a chronic lacunar infarction within the left thalamus. Vascular: As below. Skull and upper cervical spine: Normal marrow signal. Sinuses/Orbits: No abnormal signal of paranasal sinuses or mastoid air cells. Bilateral intra-ocular lens replacement. Other: None. MRA HEAD FINDINGS Anterior circulation: No large vessel occlusion, aneurysm, or significant stenosis is identified. Mild lumen irregularity of carotid siphons with mild distal left cavernous stenosis is compatible with atherosclerotic changes. Posterior circulation: Codominant vertebrobasilar system. No large vessel occlusion, aneurysm, or significant stenosis is identified. Anatomic variation: Diminutive posterior communicating arteries and large anterior communicating artery are present. MRA NECK FINDINGS Right carotid system: No aneurysm or hemodynamically significant stenosis by NASCET criteria is identified. Left carotid system: No aneurysm or hemodynamically significant stenosis by NASCET criteria is identified. Vertebrobasilar system: No dissection, aneurysm, or significant stenosis is identified. IMPRESSION: 1. 11 mm acute/early subacute infarction involving right posterior limb of internal capsule and lateral thalamus. No acute hemorrhage or mass effect. 2.  Advanced chronic microvascular ischemic changes and parenchymal volume loss of the brain. 3. Patent carotid and vertebral arteries. No aneurysm or hemodynamically significant stenosis utilizing NASCET criteria. 4. Patent circle of Willis. No large vessel occlusion, aneurysm, or significant stenosis. These results will be called to the ordering clinician or representative by the Radiologist Assistant, and communication documented in the PACS or zVision Dashboard. Electronically Signed   By: Kristine Garbe M.D.   On: 09/20/2016 22:43      Subjective: She is feeling better, no dizziness, or lightheaded.   Discharge Exam: Vitals:   10/14/16 2038 10/15/16 0600  BP: (!) 165/75 (!) 148/64  Pulse: 81   Resp: 17 18  Temp: 98.4 F (36.9 C) 98 F (36.7 C)  SpO2: 98% 100%   Vitals:   10/14/16 0522 10/14/16 1334 10/14/16 2038 10/15/16 0600  BP: (!) 165/80 (!) 157/73 (!) 165/75 (!) 148/64  Pulse: 81 95 81   Resp: 18 16 17 18   Temp: 97.7 F (36.5 C) 97.8 F (36.6 C) 98.4 F (36.9 C) 98 F (36.7 C)  TempSrc: Oral Oral Oral Oral  SpO2: 98% 98% 98% 100%  Weight: 76.8 kg (169 lb 5 oz)   73.2 kg (161 lb 6 oz)  Height:        General: Pt is alert, awake, not in acute distress Cardiovascular: RRR, S1/S2 +, no rubs, no gallops Respiratory: CTA bilaterally, no wheezing, no rhonchi Abdominal: Soft,  NT, ND, bowel sounds + Extremities: no edema, no cyanosis    The results of significant diagnostics from this hospitalization (including imaging, microbiology, ancillary and laboratory) are listed below for reference.     Microbiology: No results found for this or any previous visit (from the past 240 hour(s)).   Labs: BNP (last 3 results) No results for input(s): BNP in the last 8760 hours. Basic Metabolic Panel:  Recent Labs Lab 10/13/16 0008 10/13/16 0525 10/14/16 1404 10/14/16 1527  NA 134* 140 134*  --   K 4.6 4.7 5.0  --   CL 101 106 101  --   CO2 20* 24 24  --    GLUCOSE 164* 159* 191*  --   BUN 55* 57* 45*  --   CREATININE 2.57* 2.47* 2.33*  --   CALCIUM 10.5* 10.3 10.3  --   MG  --   --   --  2.6*   Liver Function Tests:  Recent Labs Lab 10/13/16 0525  AST 15  ALT 17  ALKPHOS 65  BILITOT 0.8  PROT 7.7  ALBUMIN 4.0   No results for input(s): LIPASE, AMYLASE in the last 168 hours. No results for input(s): AMMONIA in the last 168 hours. CBC:  Recent Labs Lab 10/13/16 0008 10/13/16 0525 10/14/16 1527  WBC 7.4 6.9  --   NEUTROABS 4.7  --   --   HGB 12.3 11.3* 11.8*  HCT 36.5 34.1* 35.5*  MCV 88.4 87.0  --   PLT 282 252  --    Cardiac Enzymes:  Recent Labs Lab 10/13/16 0026 10/13/16 0525  TROPONINI 0.03* 0.03*   BNP: Invalid input(s): POCBNP CBG:  Recent Labs Lab 10/14/16 1155 10/14/16 1403 10/14/16 1748 10/15/16 0743 10/15/16 1202  GLUCAP 137* 187* 175* 86 164*   D-Dimer No results for input(s): DDIMER in the last 72 hours. Hgb A1c No results for input(s): HGBA1C in the last 72 hours. Lipid Profile No results for input(s): CHOL, HDL, LDLCALC, TRIG, CHOLHDL, LDLDIRECT in the last 72 hours. Thyroid function studies No results for input(s): TSH, T4TOTAL, T3FREE, THYROIDAB in the last 72 hours.  Invalid input(s): FREET3 Anemia work up No results for input(s): VITAMINB12, FOLATE, FERRITIN, TIBC, IRON, RETICCTPCT in the last 72 hours. Urinalysis    Component Value Date/Time   COLORURINE YELLOW 10/12/2016 2354   APPEARANCEUR HAZY (A) 10/12/2016 2354   LABSPEC 1.013 10/12/2016 2354   PHURINE 5.0 10/12/2016 2354   GLUCOSEU NEGATIVE 10/12/2016 2354   HGBUR NEGATIVE 10/12/2016 2354   BILIRUBINUR NEGATIVE 10/12/2016 2354   KETONESUR NEGATIVE 10/12/2016 2354   PROTEINUR 100 (A) 10/12/2016 2354   UROBILINOGEN 0.2 06/30/2011 1214   NITRITE NEGATIVE 10/12/2016 2354   LEUKOCYTESUR SMALL (A) 10/12/2016 2354   Sepsis Labs Invalid input(s): PROCALCITONIN,  WBC,  LACTICIDVEN Microbiology No results found for  this or any previous visit (from the past 240 hour(s)).   Time coordinating discharge: Over 30 minutes  SIGNED:   Elmarie Shiley, MD  [facility] 10/15/2016, 3:37 PM Pager   If 7PM-7AM, please contact night-coverage [email or web address] Password [email or web address]

## 2016-10-15 NOTE — Progress Notes (Signed)
Ambulated patient in hallway with front wheel walker approximately 40 feet. Patient denied SOB or dizziness. Her only complaint was that her legs became tired. Returned patient to room and assisted her to recliner.   Lind Guest, RN

## 2016-10-15 NOTE — Progress Notes (Signed)
Discharge Plan: [facility] rep to make aware pt is dc today with HH. Jonnie Finner RN CCM Case Mgmt phone [phone]

## 2016-10-18 ENCOUNTER — Ambulatory Visit (HOSPITAL_BASED_OUTPATIENT_CLINIC_OR_DEPARTMENT_OTHER): Payer: Medicare Other | Admitting: Physical Medicine & Rehabilitation

## 2016-10-18 ENCOUNTER — Encounter: Payer: Medicare Other | Attending: Physical Medicine & Rehabilitation

## 2016-10-18 VITALS — BP 125/67 | HR 60

## 2016-10-18 DIAGNOSIS — I129 Hypertensive chronic kidney disease with stage 1 through stage 4 chronic kidney disease, or unspecified chronic kidney disease: Secondary | ICD-10-CM | POA: Insufficient documentation

## 2016-10-18 DIAGNOSIS — R269 Unspecified abnormalities of gait and mobility: Secondary | ICD-10-CM | POA: Diagnosis not present

## 2016-10-18 DIAGNOSIS — I69398 Other sequelae of cerebral infarction: Secondary | ICD-10-CM | POA: Insufficient documentation

## 2016-10-18 DIAGNOSIS — I2510 Atherosclerotic heart disease of native coronary artery without angina pectoris: Secondary | ICD-10-CM | POA: Diagnosis not present

## 2016-10-18 DIAGNOSIS — I69354 Hemiplegia and hemiparesis following cerebral infarction affecting left non-dominant side: Secondary | ICD-10-CM | POA: Insufficient documentation

## 2016-10-18 DIAGNOSIS — I252 Old myocardial infarction: Secondary | ICD-10-CM | POA: Insufficient documentation

## 2016-10-18 DIAGNOSIS — N183 Chronic kidney disease, stage 3 (moderate): Secondary | ICD-10-CM | POA: Diagnosis not present

## 2016-10-18 DIAGNOSIS — E1122 Type 2 diabetes mellitus with diabetic chronic kidney disease: Secondary | ICD-10-CM | POA: Insufficient documentation

## 2016-10-18 DIAGNOSIS — Z951 Presence of aortocoronary bypass graft: Secondary | ICD-10-CM | POA: Diagnosis not present

## 2016-10-18 NOTE — Progress Notes (Signed)
Subjective:    [name], female    DOB: 02-Nov-1927, 83 y.o.   MRN: [id number] This is an 83 year old right-handed female with history of CAD with CABG, diabetes mellitus, CKD stage 3.  Lives with her daughter, independent prior to admission.  Presented September 20, 2016, with left-sided weakness.  Cranial CT scan negative.  The patient did not receive tPA.  MRI of the brain showed 11 mm acute early subacute infarction involving the right posterior limb of internal capsule and lateral thalamus.  MRA with no large vessel occlusion or aneurysm. Echocardiogram with ejection fraction of 67%, grade 1 diastolic dysfunction.  Maintained on Plavix for CVA prophylaxis.  The patient had chest pain the day that she left rehabilitation and was admitted to the cardiology service and ruled out for myocardial infarction. She had blood pressure medications adjusted and added as well as started on imdur HPI  Was readmitted 9/13-9/15 [facility] for syncope.  Trying to drink more, Clonidine was discontinued.  Mod I dressing has adaptive shower No falls at home.  Has a walker at home. Walks with supervision  No HH since d/c  Patient has follow-up with Dr. Percival Spanish, cardiology, as well as Dr Leonie Man., Neurology, 11/23/16. Patient was aware of the cardiology follow-up, but not the neurology follow-up. We discussed the importance of attending both appointments  Pain Inventory Average Pain 0 Pain Right Now 0 My pain is na  In the last 24 hours, has pain interfered with the following? General activity 0 Relation with others 0 Enjoyment of life 0 What TIME of day is your pain at its worst? na Sleep (in general) Fair  Pain is worse with: na Pain improves with: na Relief from Meds: na  Mobility Do you have any goals in this area?  no  Function Do you have any goals in this area?  no  Neuro/Psych No problems in this area  Prior Studies Any changes since last visit?  no  Physicians  involved in your care Any changes since last visit?  no   Family History  Problem Relation Age of Onset  . Diabetes Father 69  . Unexplained death Mother 46  . CAD Neg Hx    Social History   Social History  . Marital status: Married    Spouse name: N/A  . Number of children: 2  . Years of education: N/A   Occupational History  . Retired    Social History Main Topics  . Smoking status: Never Smoker  . Smokeless tobacco: Never Used  . Alcohol use No  . Drug use: No  . Sexual activity: Not on file   Other Topics Concern  . Not on file   Social History Narrative  . No narrative on file   Past Surgical History:  Procedure Laterality Date  . CATARACT EXTRACTION W/ INTRAOCULAR LENS  IMPLANT, BILATERAL Bilateral   . CORONARY ANGIOPLASTY WITH STENT PLACEMENT  2004   "LAD & one of the circumflex"  . CORONARY ARTERY BYPASS GRAFT  2011   CABG X4   Past Medical History:  Diagnosis Date  . Acute renal injury ([place]) 07/2016  . Anginal pain ([place]) 2004; 2011  . CAD (coronary artery disease)    status post stenting of the RCA status post CABG. (left internal mammary artery to  LAD, saphenous vein graft to second diagonal, saphenous vein  graft to obtuse marginal 1, saphenous vein graft to posterior  descending).  . Cardiomyopathy     Mildly  reduced EF (45%).   . Chronic kidney disease (CKD), stage III (moderate)   . History of blood transfusion 1960s   "related to anemia"  . HTN (hypertension)   . Myocardial infarction ([place]) 2011  . Pleural effusion 2011   S/P "bypass"  . Shortness of breath   . Stroke ([place]) 09/19/2016   "left sided weakness" (09/20/2016)  . Type II diabetes mellitus ([place])    There were no vitals taken for this visit.  Opioid Risk Score:   Fall Risk Score:  `1  Depression screen PHQ 2/9  No flowsheet data found.   Review of Systems  Constitutional: Negative.   HENT: Negative.   Eyes: Negative.   Respiratory: Negative.   Cardiovascular:  Negative.   Gastrointestinal: Negative.   Endocrine: Negative.   Genitourinary: Negative.   Musculoskeletal: Negative.   Skin: Negative.   Allergic/Immunologic: Negative.   Neurological: Negative.   Hematological: Negative.   Psychiatric/Behavioral: Negative.   All other systems reviewed and are negative.      Objective:   Physical Exam  Constitutional: She is oriented to person, place, and time. She appears well-developed and well-nourished.  HENT:  Head: Normocephalic and atraumatic.  Eyes: Pupils are equal, round, and reactive to light. Conjunctivae and EOM are normal.  Neck: Normal range of motion.  Neurological: She is alert and oriented to person, place, and time. She displays no tremor. No sensory deficit. She exhibits normal muscle tone. Coordination and gait abnormal.  Motor strength is 5/5 in the right deltoid, biceps, triceps, grip, 4/5 in the left deltoid, biceps, triceps, grip  Ambulates with handhold assist both upper extremities, she leans toward the left side. She has a small step length, but no evidence to drag. There is mild instability on the left lower extremity.  Patient has symmetric finger to thumb opposition. Bilateral upper extremity has some dysmetria in left upper extremity compared to the right side.  Psychiatric: She has a normal mood and affect. Her speech is normal and behavior is normal. Judgment and thought content normal. Cognition and memory are normal.  Nursing note and vitals reviewed.         Assessment & Plan:   1. 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 No sensory loss. No neglect. Overall she has made a very good recovery, but still has residual left hemiparesis as well as gait disorder. Would recommend home health PT, OT. Given problems with her blood pressure would also recommend RN to come out as well. Physical medicine rehabilitation follow-up in 6 weeks May need to transition to outpatient therapy at that  time.  Recommend PCP follow-up Recommend cardiology follow-up Recommend neurology follow-up  A [facility] today [facility]

## 2016-10-18 NOTE — Patient Instructions (Signed)
I have made a referral to [facility] to start PT, OT as well as an RN to check blood pressure. I will see you back in 6 weeks, at that time we will determine whether you will need an outpatient PT, OT referral

## 2016-10-19 ENCOUNTER — Telehealth: Payer: Self-pay | Admitting: Physical Medicine & Rehabilitation

## 2016-10-19 NOTE — Telephone Encounter (Signed)
[facility] can get this patient staffed for PT and OT.  They are unable to get RN staffed.  The physical therapists there can handle blood pressure monitoring.  If you are okay with them doing this, they can go ahead and do the start of care for the patient.

## 2016-10-19 NOTE — Telephone Encounter (Signed)
Sounds good, start physical and occupational therapy

## 2016-11-02 ENCOUNTER — Other Ambulatory Visit: Payer: Self-pay | Admitting: Cardiology

## 2016-11-03 DIAGNOSIS — N183 Chronic kidney disease, stage 3 (moderate): Secondary | ICD-10-CM

## 2016-11-03 DIAGNOSIS — I2510 Atherosclerotic heart disease of native coronary artery without angina pectoris: Secondary | ICD-10-CM

## 2016-11-03 DIAGNOSIS — I69354 Hemiplegia and hemiparesis following cerebral infarction affecting left non-dominant side: Secondary | ICD-10-CM

## 2016-11-03 DIAGNOSIS — E1151 Type 2 diabetes mellitus with diabetic peripheral angiopathy without gangrene: Secondary | ICD-10-CM

## 2016-11-03 DIAGNOSIS — Z7902 Long term (current) use of antithrombotics/antiplatelets: Secondary | ICD-10-CM

## 2016-11-03 DIAGNOSIS — E1122 Type 2 diabetes mellitus with diabetic chronic kidney disease: Secondary | ICD-10-CM

## 2016-11-03 DIAGNOSIS — I129 Hypertensive chronic kidney disease with stage 1 through stage 4 chronic kidney disease, or unspecified chronic kidney disease: Secondary | ICD-10-CM

## 2016-11-03 DIAGNOSIS — Z7982 Long term (current) use of aspirin: Secondary | ICD-10-CM

## 2016-11-03 DIAGNOSIS — E785 Hyperlipidemia, unspecified: Secondary | ICD-10-CM

## 2016-11-07 ENCOUNTER — Telehealth: Payer: Self-pay

## 2016-11-07 NOTE — Telephone Encounter (Signed)
Notified [place] OT

## 2016-11-07 NOTE — Telephone Encounter (Signed)
Belenda Cruise OT with [facility] is requesting orders for patient 2 times for 2 weeks.

## 2016-11-07 NOTE — Telephone Encounter (Signed)
Okay for OT, home health twice a week for 2 weeks

## 2016-11-16 ENCOUNTER — Telehealth: Payer: Self-pay

## 2016-11-16 NOTE — Telephone Encounter (Signed)
Error

## 2016-11-18 ENCOUNTER — Telehealth: Payer: Self-pay

## 2016-11-18 NOTE — Telephone Encounter (Signed)
Physical therapist is requesting a verbal order for more time so that the patient can reach her goals. She is requesting 2 times a week for 4 weeks.

## 2016-11-21 ENCOUNTER — Telehealth: Payer: Self-pay

## 2016-11-21 NOTE — Telephone Encounter (Signed)
OT 2 times a week for 1 week and 1 time a week for 2 weeks home health please call in

## 2016-11-21 NOTE — Telephone Encounter (Signed)
Belenda Cruise OT from [facility] needs verbal orders for 2 times for 1 week and 1 time for 2 weeks.

## 2016-11-22 NOTE — Progress Notes (Signed)
HPI The patient presents for followup of CVA.  She had a right posterior limb internal capsule stroke.  We saw her during that admission for management of her BP.  She was in the hospital again following this for evaluation of syncope.  I reviewed these records for this visit.    This was thought to be related to hypotension.  Echo was essentially normal.  Since that time she says she is doing very well. She thinks she feels better than she has in years and she is working with physical therapy and occupational therapy. She's walking with a walker and just graduated to obtain today. She's not up a few stairs. She's not had any presyncope or syncope. She denies any chest pressure, neck or arm discomfort. She's had no shortness of breath, PND or orthopnea.  No Known Allergies  Current Outpatient Prescriptions  Medication Sig Dispense Refill  . amLODipine (NORVASC) 10 MG tablet TAKE ONE TABLET BY MOUTH ONCE DAILY. 90 tablet 3  . atorvastatin (LIPITOR) 40 MG tablet Take 40 mg by mouth daily.    . cholecalciferol (VITAMIN D) 400 units TABS tablet Take 1 tablet (400 Units total) by mouth daily. 30 each 0  . clopidogrel (PLAVIX) 75 MG tablet Take 1 tablet (75 mg total) by mouth daily. 30 tablet 0  . glipiZIDE (GLUCOTROL) 10 MG tablet Take 1 tablet (10 mg total) by mouth daily before breakfast. 30 tablet 0  . isosorbide mononitrate (IMDUR) 30 MG 24 hr tablet Take 1 tablet (30 mg total) by mouth daily. 30 tablet 5  . metoprolol tartrate (LOPRESSOR) 50 MG tablet Take 50 mg by mouth 2 (two) times daily.    . nitroGLYCERIN (NITROSTAT) 0.4 MG SL tablet Place 1 tablet (0.4 mg total) under the tongue every 5 (five) minutes x 3 doses as needed for chest pain. 25 tablet 2  . polyethylene glycol (MIRALAX / GLYCOLAX) packet Take 17 g by mouth daily. 14 each 0  . protein supplement shake (PREMIER PROTEIN) LIQD Take 325 mLs (11 oz total) by mouth daily. 30 Can 0  . senna (SENOKOT) 8.6 MG TABS tablet Take 1 tablet  (8.6 mg total) by mouth daily. 120 each 0   No current facility-administered medications for this visit.     Past Medical History:  Diagnosis Date  . Acute renal injury ([place]) 07/2016  . Anginal pain ([place]) 2004; 2011  . CAD (coronary artery disease)    status post stenting of the RCA status post CABG. (left internal mammary artery to  LAD, saphenous vein graft to second diagonal, saphenous vein  graft to obtuse marginal 1, saphenous vein graft to posterior  descending).  . Cardiomyopathy     Mildly reduced EF (45%).   . Chronic kidney disease (CKD), stage III (moderate) ([place])   . History of blood transfusion 1960s   "related to anemia"  . HTN (hypertension)   . Myocardial infarction ([place]) 2011  . Pleural effusion 2011   S/P "bypass"  . Shortness of breath   . Stroke ([place]) 09/19/2016   "left sided weakness" (09/20/2016)  . Type II diabetes mellitus ([place])     Past Surgical History:  Procedure Laterality Date  . CATARACT EXTRACTION W/ INTRAOCULAR LENS  IMPLANT, BILATERAL Bilateral   . CORONARY ANGIOPLASTY WITH STENT PLACEMENT  2004   "LAD & one of the circumflex"  . CORONARY ARTERY BYPASS GRAFT  2011   CABG X4    ROS:  As stated in the HPI and negative  for all other systems.  PHYSICAL EXAM BP (!) 171/75   Pulse 65   Ht 5\' 3"  (1.6 m)   Wt 161 lb 6.4 oz (73.2 kg)   SpO2 98%   BMI 28.59 kg/m   PHYSICAL EXAM GEN:  No distress NECK:  No jugular venous distention at 90 degrees, waveform within normal limits, carotid upstroke brisk and symmetric, no bruits, no thyromegaly LYMPHATICS:  No cervical adenopathy LUNGS:  Clear to auscultation bilaterally BACK:  No CVA tenderness CHEST:  Unremarkable HEART:  S1 and S2 within normal limits, no S3, no S4, no clicks, no rubs, no murmurs ABD:  Positive bowel sounds normal in frequency in pitch, no bruits, no rebound, no guarding, unable to assess midline mass or bruit with the patient seated. EXT:  2 plus pulses throughout, mild ankle  edema, no cyanosis no clubbing   EKG:  NA.    ASSESSMENT AND PLAN  SYNCOPE - She has had no further syncope.  No further work up is indicated.    CAD - The patient has no new sypmtoms.  No further cardiovascular testing is indicated.  We will continue with aggressive risk reduction and meds as listed.  HYPERTENSION -  The blood pressure is at target. No change in medications is indicated. We will continue with therapeutic lifestyle changes (TLC).  DYSLIPIDEMIA -  This is followed by Dr. Shelia Media.

## 2016-11-22 NOTE — Telephone Encounter (Signed)
Called [name] back and informed her of approved orders

## 2016-11-23 ENCOUNTER — Ambulatory Visit: Payer: Self-pay | Admitting: Neurology

## 2016-11-23 ENCOUNTER — Ambulatory Visit (INDEPENDENT_AMBULATORY_CARE_PROVIDER_SITE_OTHER): Payer: Medicare Other | Admitting: Cardiology

## 2016-11-23 ENCOUNTER — Encounter: Payer: Self-pay | Admitting: Cardiology

## 2016-11-23 VITALS — BP 171/75 | HR 65 | Ht 63.0 in | Wt 161.4 lb

## 2016-11-23 DIAGNOSIS — R55 Syncope and collapse: Secondary | ICD-10-CM

## 2016-11-23 DIAGNOSIS — I10 Essential (primary) hypertension: Secondary | ICD-10-CM | POA: Diagnosis not present

## 2016-11-23 DIAGNOSIS — E785 Hyperlipidemia, unspecified: Secondary | ICD-10-CM | POA: Diagnosis not present

## 2016-11-23 DIAGNOSIS — I2510 Atherosclerotic heart disease of native coronary artery without angina pectoris: Secondary | ICD-10-CM | POA: Diagnosis not present

## 2016-11-23 NOTE — Patient Instructions (Signed)
Medication Instructions:  Continue current medications  If you need a refill on your cardiac medications before your next appointment, please call your pharmacy.  Labwork: None Ordered  Testing/Procedures: None Ordered  Follow-Up: Your physician wants you to follow-up in: 4 Months.    Thank you for choosing [facility]!!      

## 2016-11-29 ENCOUNTER — Encounter: Payer: Medicare Other | Attending: Physical Medicine & Rehabilitation

## 2016-11-29 ENCOUNTER — Encounter: Payer: Self-pay | Admitting: Physical Medicine & Rehabilitation

## 2016-11-29 ENCOUNTER — Ambulatory Visit (HOSPITAL_BASED_OUTPATIENT_CLINIC_OR_DEPARTMENT_OTHER): Payer: Medicare Other | Admitting: Physical Medicine & Rehabilitation

## 2016-11-29 VITALS — BP 156/72 | HR 58

## 2016-11-29 DIAGNOSIS — R269 Unspecified abnormalities of gait and mobility: Secondary | ICD-10-CM | POA: Diagnosis not present

## 2016-11-29 DIAGNOSIS — I69398 Other sequelae of cerebral infarction: Secondary | ICD-10-CM | POA: Insufficient documentation

## 2016-11-29 DIAGNOSIS — I69354 Hemiplegia and hemiparesis following cerebral infarction affecting left non-dominant side: Secondary | ICD-10-CM

## 2016-11-29 DIAGNOSIS — I129 Hypertensive chronic kidney disease with stage 1 through stage 4 chronic kidney disease, or unspecified chronic kidney disease: Secondary | ICD-10-CM | POA: Insufficient documentation

## 2016-11-29 DIAGNOSIS — N183 Chronic kidney disease, stage 3 (moderate): Secondary | ICD-10-CM | POA: Insufficient documentation

## 2016-11-29 DIAGNOSIS — E1122 Type 2 diabetes mellitus with diabetic chronic kidney disease: Secondary | ICD-10-CM | POA: Insufficient documentation

## 2016-11-29 DIAGNOSIS — I2510 Atherosclerotic heart disease of native coronary artery without angina pectoris: Secondary | ICD-10-CM | POA: Diagnosis not present

## 2016-11-29 DIAGNOSIS — I252 Old myocardial infarction: Secondary | ICD-10-CM | POA: Diagnosis not present

## 2016-11-29 DIAGNOSIS — Z951 Presence of aortocoronary bypass graft: Secondary | ICD-10-CM | POA: Diagnosis not present

## 2016-11-29 NOTE — Progress Notes (Signed)
Subjective:    [name], female    DOB: [birth year], 83 y.o.   MRN: [id number] 83 year old right-handed female with history of CAD with CABG, diabetes mellitus, CKD stage 3.  Lives with her daughter, independent prior to admission.  Presented September 20, 2016, with left-sided weakness.  Cranial CT scan negative.  The patient did not receive tPA.  MRI of the brain showed 11 mm acute early subacute infarction involving the right posterior limb of internal capsule and lateral thalamus.  MRA with no large vessel occlusion or aneurysm. Echocardiogram with ejection fraction of 24%, grade 1 diastolic dysfunction.  Maintained on Plavix for CVA prophylaxis.  The patient had chest pain the day that she left rehabilitation and was admitted to the cardiology service and ruled out for myocardial infarction. She had blood pressure medications adjusted and added as well as started on imdur HPI  Was readmitted 9/13-9/15 [facility] for syncope.  Trying to drink more, Clonidine was discontinued. HPI   Seen by Dr Percival Spanish 10/24 Home health PT twice a week HH OT once a week Amb with cane at home with supervision only Amb with walker Mod I Mod I dressing and bathing Has 24/7 supervision No housework since her MI  Working on hip adduction exercise   Pain Inventory Average Pain 0 Pain Right Now 0 My pain is no pain  In the last 24 hours, has pain interfered with the following? General activity 0 Relation with others 0 Enjoyment of life 0 What TIME of day is your pain at its worst? no pain Sleep (in general) no pain  Pain is worse with: no pain Pain improves with: no pain Relief from Meds: no pain  Mobility walk with assistance use a walker ability to climb steps?  no do you drive?  no use a wheelchair  Function retired  Neuro/Psych No problems in this area  Prior Studies Any changes since last visit?  no  Physicians involved in your care Any changes since last visit?   no   Family History  Problem Relation Age of Onset  . Diabetes Father 37  . Unexplained death Mother 61  . CAD Neg Hx    Social History   Social History  . Marital status: Married    Spouse name: N/A  . Number of children: 2  . Years of education: N/A   Occupational History  . Retired    Social History Main Topics  . Smoking status: Never Smoker  . Smokeless tobacco: Never Used  . Alcohol use No  . Drug use: No  . Sexual activity: Not on file   Other Topics Concern  . Not on file   Social History Narrative  . No narrative on file   Past Surgical History:  Procedure Laterality Date  . CATARACT EXTRACTION W/ INTRAOCULAR LENS  IMPLANT, BILATERAL Bilateral   . CORONARY ANGIOPLASTY WITH STENT PLACEMENT  2004   "LAD & one of the circumflex"  . CORONARY ARTERY BYPASS GRAFT  2011   CABG X4   Past Medical History:  Diagnosis Date  . Acute renal injury ([place]) 07/2016  . Anginal pain ([place]) 2004; 2011  . CAD (coronary artery disease)    status post stenting of the RCA status post CABG. (left internal mammary artery to  LAD, saphenous vein graft to second diagonal, saphenous vein  graft to obtuse marginal 1, saphenous vein graft to posterior  descending).  . Cardiomyopathy     Mildly reduced EF (45%).   Marland Kitchen  Chronic kidney disease (CKD), stage III (moderate) ([facility])   . History of blood transfusion 1960s   "related to anemia"  . HTN (hypertension)   . Myocardial infarction ([place]) 2011  . Pleural effusion 2011   S/P "bypass"  . Shortness of breath   . Stroke ([place]) 09/19/2016   "left sided weakness" (09/20/2016)  . Type II diabetes mellitus ([place])    There were no vitals taken for this visit.  Opioid Risk Score:   Fall Risk Score:  `1  Depression screen PHQ 2/9  Depression screen [facility] 2/9 11/29/2016 10/18/2016  Decreased Interest 0 0  Down, Depressed, Hopeless 0 0  PHQ - 2 Score 0 0  Altered sleeping - 0  Tired, decreased energy - 2  Change in appetite - 0  Feeling  bad or failure about yourself  - 0  Trouble concentrating - 0  Moving slowly or fidgety/restless - 0  Suicidal thoughts - 0  PHQ-9 Score - 2  Difficult doing work/chores - Not difficult at all  Some recent data might be hidden      Review of Systems     Objective:   Physical Exam  Constitutional: She is oriented to person, place, and time. She appears well-developed and well-nourished. No distress.  HENT:  Head: Normocephalic and atraumatic.  Eyes: Pupils are equal, round, and reactive to light. Conjunctivae and EOM are normal.  Neck: Normal range of motion. Neck supple.  Neurological: She is alert and oriented to person, place, and time. Gait abnormal.  Motor strength is 5/5 in the right deltoid, bicep, tricep, grip, hip flexion, extension, ankle dorsi flexion 4/5 in the left deltoid, biceps, triceps, grip, hip flexion, knee extension, ankle dorsi flexion. Stands with a Trendelenburg dip on the right.  Ambulates with handheld assistance, Trendelenburg gait. No evidence to drag or knee instability  Skin: She is not diaphoretic.  Psychiatric: She has a normal mood and affect. Her behavior is normal. Judgment and thought content normal.  Nursing note and vitals reviewed.  Fine motor deficit, decreased finger to thumb opposition. Decreased rapid alternating movements, supination, pronation left upper extremity       Assessment & Plan:  1. Right PL IC infarct with left hemiparesis, overall excellent improvement in function. Still requiring supervision, left hip abductor strength is impacting gait. We'll complete home health PT, OT, PMR follow-up in one month  We'll likely order outpatient PT, next month Still has some mild fine motor deficits on left side, which are not impacting function to any great degree

## 2016-11-29 NOTE — Patient Instructions (Signed)
Please keep up with your hip strengthening exercises. I anticipate he will need outpatient therapy after home health finishes up. I can order that next month.

## 2016-12-16 ENCOUNTER — Telehealth: Payer: Self-pay

## 2016-12-16 DIAGNOSIS — I69354 Hemiplegia and hemiparesis following cerebral infarction affecting left non-dominant side: Secondary | ICD-10-CM

## 2016-12-16 NOTE — Telephone Encounter (Signed)
May order outpt PT and OT

## 2016-12-16 NOTE — Telephone Encounter (Signed)
[name] PT with [facility] stated that patients last day for therapy was yesterday (12/15/16) and is requesting out patient therapy for continued service. She also wanted to make Dr. Letta Pate aware of patient having high blood pressure yesterday (12/15/16).

## 2016-12-19 ENCOUNTER — Telehealth: Payer: Self-pay | Admitting: *Deleted

## 2016-12-19 NOTE — Telephone Encounter (Signed)
OT called asking for verbal orders 1week1 for discharge visit.  Verbal orders given

## 2016-12-19 NOTE — Telephone Encounter (Signed)
Orders placed for OT and PT, [facility]

## 2016-12-20 ENCOUNTER — Telehealth: Payer: Self-pay | Admitting: Cardiology

## 2016-12-20 NOTE — Telephone Encounter (Signed)
Please call,concerning a medicine that was left off her list when she was here for her last office visit.

## 2016-12-20 NOTE — Telephone Encounter (Signed)
Pt notes she is taking clonidine 0.1mg  BID and this was left off her last med list - she states it was added to her regimen during a hospital visit in August. Pt aware I have added this to her list, will route to Dr. Percival Spanish as Juluis Rainier. Pt verbalized understanding and thanks.

## 2016-12-21 ENCOUNTER — Ambulatory Visit: Payer: Self-pay | Admitting: Neurology

## 2016-12-27 ENCOUNTER — Other Ambulatory Visit: Payer: Self-pay

## 2016-12-27 ENCOUNTER — Encounter: Payer: Self-pay | Admitting: Physical Medicine & Rehabilitation

## 2016-12-27 ENCOUNTER — Encounter: Payer: Medicare Other | Attending: Physical Medicine & Rehabilitation

## 2016-12-27 ENCOUNTER — Ambulatory Visit: Payer: Medicare Other | Admitting: Physical Medicine & Rehabilitation

## 2016-12-27 VITALS — BP 173/71 | HR 61 | Resp 14

## 2016-12-27 DIAGNOSIS — R269 Unspecified abnormalities of gait and mobility: Secondary | ICD-10-CM | POA: Insufficient documentation

## 2016-12-27 DIAGNOSIS — I129 Hypertensive chronic kidney disease with stage 1 through stage 4 chronic kidney disease, or unspecified chronic kidney disease: Secondary | ICD-10-CM | POA: Insufficient documentation

## 2016-12-27 DIAGNOSIS — E1122 Type 2 diabetes mellitus with diabetic chronic kidney disease: Secondary | ICD-10-CM | POA: Diagnosis not present

## 2016-12-27 DIAGNOSIS — N183 Chronic kidney disease, stage 3 (moderate): Secondary | ICD-10-CM | POA: Diagnosis not present

## 2016-12-27 DIAGNOSIS — I69354 Hemiplegia and hemiparesis following cerebral infarction affecting left non-dominant side: Secondary | ICD-10-CM

## 2016-12-27 DIAGNOSIS — I252 Old myocardial infarction: Secondary | ICD-10-CM | POA: Insufficient documentation

## 2016-12-27 DIAGNOSIS — Z951 Presence of aortocoronary bypass graft: Secondary | ICD-10-CM | POA: Diagnosis not present

## 2016-12-27 DIAGNOSIS — I69398 Other sequelae of cerebral infarction: Secondary | ICD-10-CM | POA: Insufficient documentation

## 2016-12-27 DIAGNOSIS — I2510 Atherosclerotic heart disease of native coronary artery without angina pectoris: Secondary | ICD-10-CM | POA: Diagnosis not present

## 2016-12-27 NOTE — Progress Notes (Signed)
Subjective:    [name], female    DOB: 31-Jul-1927, 83 y.o.   MRN: [id number] 83 year old right-handed female with history of CAD with CABG, diabetes mellitus, CKD stage 3. Lives with her daughter, independent prior to admission. Presented September 20, 2016, with left-sided weakness. Cranial CT scan negative. The patient did not receive tPA. MRI of the brain showed 11 mm acute early subacute infarction involving the right posterior limb of internal capsule and lateral thalamus. MRA with no large vessel occlusion or aneurysm. Echocardiogram with ejection fraction of 05%, grade 1 diastolic dysfunction. Maintained on Plavix for CVA prophylaxis.  The patient had chest pain the day that she left rehabilitation and was admitted to the cardiology service and ruled out for myocardial infarction. She had blood pressure medications adjusted and added as well as started on imdur HPI  [facility] last week Nephew works at night  Has transportatIon to appts No new problems or falls since last visit Starting to walk without assistive device with therapy only.  Usually uses a walker at home.  Also starting to work on using a cane with therapy. Pain Inventory Average Pain 0 Pain Right Now 0 My pain is no pain  In the last 24 hours, has pain interfered with the following? General activity 0 Relation with others 0 Enjoyment of life 0 What TIME of day is your pain at its worst? no pain Sleep (in general) Good  Pain is worse with: no pain Pain improves with: no pain Relief from Meds: no pain  Mobility walk with assistance use a cane use a walker use a wheelchair  Function retired  Neuro/Psych trouble walking  Prior Studies Any changes since last visit?  no  Physicians involved in your care Any changes since last visit?  no   Family History  Problem Relation Age of Onset  . Diabetes Father 38  . Unexplained death Mother 32  . CAD Neg Hx    Social History     Socioeconomic History  . Marital status: Married    Spouse name: None  . Number of children: 2  . Years of education: None  . Highest education level: None  Social Needs  . Financial resource strain: None  . Food insecurity - worry: None  . Food insecurity - inability: None  . Transportation needs - medical: None  . Transportation needs - non-medical: None  Occupational History  . Occupation: Retired  Tobacco Use  . Smoking status: Never Smoker  . Smokeless tobacco: Never Used  Substance and Sexual Activity  . Alcohol use: No  . Drug use: No  . Sexual activity: None  Other Topics Concern  . None  Social History Narrative  . None   Past Surgical History:  Procedure Laterality Date  . CATARACT EXTRACTION W/ INTRAOCULAR LENS  IMPLANT, BILATERAL Bilateral   . CORONARY ANGIOPLASTY WITH STENT PLACEMENT  2004   "LAD & one of the circumflex"  . CORONARY ARTERY BYPASS GRAFT  2011   CABG X4   Past Medical History:  Diagnosis Date  . Acute renal injury ([place]) 07/2016  . Anginal pain ([place]) 2004; 2011  . CAD (coronary artery disease)    status post stenting of the RCA status post CABG. (left internal mammary artery to  LAD, saphenous vein graft to second diagonal, saphenous vein  graft to obtuse marginal 1, saphenous vein graft to posterior  descending).  . Cardiomyopathy     Mildly reduced EF (45%).   Marland Kitchen  Chronic kidney disease (CKD), stage III (moderate) (HCC)   . History of blood transfusion 1960s   "related to anemia"  . HTN (hypertension)   . Myocardial infarction ([place]) 2011  . Pleural effusion 2011   S/P "bypass"  . Shortness of breath   . Stroke ([place]) 09/19/2016   "left sided weakness" (09/20/2016)  . Type II diabetes mellitus (HCC)    BP (!) 173/71 (BP Location: Right Arm, Patient Position: Sitting, Cuff Size: Normal)   Pulse 61   Resp 14   SpO2 95%   Opioid Risk Score:   Fall Risk Score:  `1  Depression screen PHQ 2/9  Depression screen [facility] 2/9 11/29/2016  10/18/2016  Decreased Interest 0 0  Down, Depressed, Hopeless 0 0  PHQ - 2 Score 0 0  Altered sleeping - 0  Tired, decreased energy - 2  Change in appetite - 0  Feeling bad or failure about yourself  - 0  Trouble concentrating - 0  Moving slowly or fidgety/restless - 0  Suicidal thoughts - 0  PHQ-9 Score - 2  Difficult doing work/chores - Not difficult at all  Some recent data might be hidden    Review of Systems  Constitutional: Negative.   HENT: Negative.   Eyes: Negative.   Respiratory: Negative.   Cardiovascular: Negative.   Gastrointestinal: Negative.   Endocrine: Negative.   Genitourinary: Negative.   Musculoskeletal: Positive for gait problem.  Skin: Negative.   Allergic/Immunologic: Negative.   Hematological: Negative.        Objective:   Physical Exam  Constitutional: She is oriented to person, place, and time. She appears well-developed and well-nourished. No distress.  HENT:  Head: Normocephalic and atraumatic.  Eyes: Conjunctivae and EOM are normal. Pupils are equal, round, and reactive to light.  Neurological: She is alert and oriented to person, place, and time.  Skin: Skin is warm and dry. She is not diaphoretic.  Psychiatric: She has a normal mood and affect.  Nursing note and vitals reviewed.  Motor strength is 4+ in the left deltoid bicep tricep grip hip flexor knee extensor ankle dorsiflexor 5/5 in the right deltoid bicep tricep grip hip flexor knee extensor ankle dorsiflexor       Assessment & Plan:  1.  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  She has made good improvements with her functional mobility as well as ADL skills.  She is still not back at her baseline.  She still mainly has mobility issues with balance, but also has some fine motor issues with the left upper extremity. Will refer to outpatient physical therapy as well as occupational therapy.

## 2016-12-27 NOTE — Patient Instructions (Signed)
Referral made to outpt PT, OT  This is the same location as Dr Clydene Fake office

## 2016-12-28 ENCOUNTER — Ambulatory Visit: Payer: Medicare Other | Admitting: Occupational Therapy

## 2016-12-30 ENCOUNTER — Ambulatory Visit: Payer: Medicare Other | Admitting: Physical Therapy

## 2017-01-02 ENCOUNTER — Encounter: Payer: Self-pay | Admitting: Physical Therapy

## 2017-01-02 ENCOUNTER — Encounter: Payer: Self-pay | Admitting: Occupational Therapy

## 2017-01-02 ENCOUNTER — Ambulatory Visit: Payer: Medicare Other | Attending: Physical Medicine & Rehabilitation | Admitting: Physical Therapy

## 2017-01-02 ENCOUNTER — Ambulatory Visit: Payer: Medicare Other | Admitting: Occupational Therapy

## 2017-01-02 ENCOUNTER — Encounter: Payer: Medicare Other | Admitting: Occupational Therapy

## 2017-01-02 DIAGNOSIS — I69354 Hemiplegia and hemiparesis following cerebral infarction affecting left non-dominant side: Secondary | ICD-10-CM

## 2017-01-02 DIAGNOSIS — R2681 Unsteadiness on feet: Secondary | ICD-10-CM | POA: Diagnosis present

## 2017-01-02 DIAGNOSIS — M6281 Muscle weakness (generalized): Secondary | ICD-10-CM | POA: Insufficient documentation

## 2017-01-02 DIAGNOSIS — R2689 Other abnormalities of gait and mobility: Secondary | ICD-10-CM

## 2017-01-02 NOTE — Therapy (Signed)
[facility] [address] [place], [place], [address] Phone: [phone]   Fax:  [phone]  Physical Therapy Evaluation  Patient Details  Name: [name] MRN: [id number] Date of Birth: 11/04/1927 Referring Provider: Alysia Penna MD   Encounter Date: 01/02/2017  PT End of Session - 01/02/17 1441    Visit Number  1    Number of Visits  17    Date for PT Re-Evaluation  03/03/17    Authorization Type  01/02/17-03/03/17    PT Start Time  1100    PT Stop Time  1147    PT Time Calculation (min)  47 min    Activity Tolerance  Patient tolerated treatment well    Behavior During Therapy  [facility] for tasks assessed/performed       Past Medical History:  Diagnosis Date  . Acute renal injury ([place]) 07/2016  . Anginal pain ([place]) 2004; 2011  . CAD (coronary artery disease)    status post stenting of the RCA status post CABG. (left internal mammary artery to  LAD, saphenous vein graft to second diagonal, saphenous vein  graft to obtuse marginal 1, saphenous vein graft to posterior  descending).  . Cardiomyopathy     Mildly reduced EF (45%).   . Chronic kidney disease (CKD), stage III (moderate) ([place])   . History of blood transfusion 1960s   "related to anemia"  . HTN (hypertension)   . Myocardial infarction ([place]) 2011  . Pleural effusion 2011   S/P "bypass"  . Shortness of breath   . Stroke ([place]) 09/19/2016   "left sided weakness" (09/20/2016)  . Type II diabetes mellitus ([place])     Past Surgical History:  Procedure Laterality Date  . CATARACT EXTRACTION W/ INTRAOCULAR LENS  IMPLANT, BILATERAL Bilateral   . CORONARY ANGIOPLASTY WITH STENT PLACEMENT  2004   "LAD & one of the circumflex"  . CORONARY ARTERY BYPASS GRAFT  2011   CABG X4    There were no vitals filed for this visit.   Subjective Assessment - 01/02/17 1428    Subjective  Reports on completing HHPT/HHOT, the therapists told her it was safe for her to  return to using her bedroom on the second floor of her home. She states she does not want to take any chances and only goes up/down the steps when her nephew or other family member is present. States she knows her legs are not back to their usual strength (including her "good leg (right)"    Pertinent History  CAD, CABG, DM, CKD, cardiomyopathy, HTN    Patient Stated Goals  Be able to walk with a SPC or no device at all    Currently in Pain?  No/denies         [facility] PT Assessment - 01/02/17 1106      Assessment   Medical Diagnosis  Rt internal capsule and lateral thalamus CVA    Referring Provider  Alysia Penna MD    Onset Date/Surgical Date  09/20/16    Prior Therapy  CIR, HH      Precautions   Precautions  Fall      Restrictions   Weight Bearing Restrictions  No      Balance Screen   Has the patient fallen in the past 6 months  No    Has the patient had a decrease in activity level because of a fear of falling?   No    Is the patient reluctant to leave their  home because of a fear of falling?   No      Home Environment   Living Environment  Private residence    Living Arrangements  Other relatives nephew works days; dtr lives in [employer]    Available Help at Discharge  Family;Available PRN/intermittently    Type of Home  House    Home Access  Stairs to enter    Entrance Stairs-Number of Steps  2    Entrance Stairs-Rails  Left wall on the right    Home Layout  Two level;Bed/bath upstairs    Alternate Level Stairs-Number of Steps  flight  with landing    Alternate Level Stairs-Rails  Right;Left    Home Equipment  Transport chair;Cane - quad;Shower seat - built in;Shower seat;Grab bars - tub/shower;Hand held shower head;Walker - 2 wheels walker no seat; downstairs built in shower seat;       Prior Function   Level of Independence  Independent with basic ADLs;Independent with community mobility without device;Independent with homemaking with ambulation    Leisure   exercised 3x week with trainer      Cognition   Overall Cognitive Status  Within Functional Limits for tasks assessed      Observation/Other Assessments   Focus on Therapeutic Outcomes (FOTO)   set up for wrong diagnosis      Observation/Other Assessments-Edema    Edema  -- pitting edema bil lower legs      Sensation   Light Touch  Appears Intact      Coordination   Gross Motor Movements are Fluid and Coordinated  Yes    Fine Motor Movements are Fluid and Coordinated  No toe taps degrade       Posture/Postural Control   Posture/Postural Control  No significant limitations      ROM / Strength   AROM / PROM / Strength  AROM;Strength      AROM   Overall AROM   Within functional limits for tasks performed bil LEs      Strength   Overall Strength  Deficits    Strength Assessment Site  Hip;Knee;Ankle    Right/Left Hip  Right;Left    Right Hip Flexion  4-/5    Right Hip ABduction  3+/5    Left Hip Flexion  3+/5    Left Hip ABduction  3/5    Right/Left Knee  Right;Left    Right Knee Flexion  4+/5    Right Knee Extension  4/5    Left Knee Flexion  3+/5    Left Knee Extension  4-/5    Right/Left Ankle  Right;Left    Right Ankle Dorsiflexion  4+/5    Left Ankle Dorsiflexion  4/5      Transfers   Five time sit to stand comments   17.66      Ambulation/Gait   Gait velocity  20 ft/20.44 sec=0.98      Standardized Balance Assessment   Standardized Balance Assessment  Timed Up and Go Test      Timed Up and Go Test   Normal TUG (seconds)  32.8 RW             Objective measurements completed on examination: See above findings.              PT Education - 01/02/17 1438    Education provided  Yes    Education Details  results of assessments and comparison to age-based norms;     Person(s) Educated  Patient  Methods  Explanation    Comprehension  Verbalized understanding       PT Short Term Goals - 01/02/17 1458      PT SHORT TERM GOAL #1   Title   Patient will be independent with HEP. (Target for all STGs 02/02/2016    Time  4    Period  Weeks    Status  New    Target Date  02/01/17      PT SHORT TERM GOAL #2   Title  Patient will ambulate modified independent with LRAD x 300 ft with velocity averaging >1.5 ft/sec.     Time  4    Period  Weeks    Status  New      PT SHORT TERM GOAL #3   Title  Patient will improve 5x sit to stand time to <=15 seconds (indicative of incr strength, balance, and lesser fall risk)    Time  4    Period  Weeks    Status  New      PT SHORT TERM GOAL #4   Title  Patient will decrease TUG time to <25.0 seconds, demonstrating a reducing fall risk.     Time  4    Period  Weeks    Status  New        PT Long Term Goals - 01/02/17 1504      PT LONG TERM GOAL #1   Title  Patient will be independent with updated HEP. (Target for all STGs 2/01/019)    Time  8    Period  Weeks    Status  New    Target Date  03/03/17      PT LONG TERM GOAL #2   Title  Patient will ambulate modified independent vs independent with LRAD on level indoor and outdoor surfaces (paved walk, paved parking) x 400 ft.     Time  8    Period  Weeks    Status  New      PT LONG TERM GOAL #3   Title  Patient will improve 5x sit to stand time to <=12 sec to demonstrate lesser fall risk    Time  8    Period  Weeks    Status  New      PT LONG TERM GOAL #4   Title  Patient will improve her TUG to <=20 seconds-demonstrating a lesser fall risk.     Time  8    Period  Weeks    Status  New      PT LONG TERM GOAL #5   Title  Patient will ambulate with LRAD with velocity >=1.8 ft/sec    Time  8    Period  Weeks    Status  New             Plan - 01/02/17 1443    Clinical Impression Statement  Patient presents for OPPT evaluation s/p Rt internal capsule and lateral thalamus infarct on 09/20/16. Patient is found to have generalized weakness superimposed on left hemiparesis resulting in decreased safety with mobilty. Her  TUG of 32.8 seconds with a RW shows her increased fall risk (times above 13.5 sec show incr fall risk). Her gait velocity of 0.98 ft/sec also puts her in a high fall risk group (times <1.8 ft/sec). She appears very motivated and anticipate she will progress well towards PT goals utlizing the interventions listed below.     History and Personal Factors relevant to plan of care:  PMH-HTN, CKD, cardiomyopathy, DM, CAD, CABG   Personal factors-age,     Clinical Presentation  Evolving    Clinical Presentation due to:  continued HTN above goal; subacute CVA (<6 months)    Clinical Decision Making  Moderate    Rehab Potential  Good    Clinical Impairments Affecting Rehab Potential  motivated, HTN     PT Frequency  2x / week    PT Duration  8 weeks    PT Treatment/Interventions  ADLs/Self Care Home Management;DME Instruction;Gait training;Neuromuscular re-education;Balance training;Therapeutic exercise;Therapeutic activities;Functional mobility training;Stair training;Patient/family education    PT Next Visit Plan  Check BP!; Assess her HEP from [facility] and update as appropriate;     Consulted and Agree with Plan of Care  Patient       Patient will benefit from skilled therapeutic intervention in order to improve the following deficits and impairments:  Abnormal gait, Cardiopulmonary status limiting activity, Decreased balance, Decreased mobility, Decreased knowledge of use of DME, Decreased endurance, Decreased coordination, Decreased strength, Increased edema, Difficulty walking  Visit Diagnosis: Other abnormalities of gait and mobility - Plan: PT plan of care cert/re-cert  Muscle weakness (generalized) - Plan: PT plan of care cert/re-cert  Unsteadiness on feet - Plan: PT plan of care cert/re-cert  G-Codes - 93/73/42 1516    Functional Assessment Tool Used (Outpatient Only)  gait velocity 0.98 ft/sec; TUG 32.8 sec; 5x sit to stand 17.66 sec    Functional Limitation  Mobility: Walking and moving around     Mobility: Walking and Moving Around Current Status [phone])  At least 40 percent but less than 60 percent impaired, limited or restricted    Mobility: Walking and Moving Around Goal Status [phone])  At least 1 percent but less than 20 percent impaired, limited or restricted        Problem List Patient Active Problem List   Diagnosis Date Noted  . Rectal bleed 10/13/2016  . Malnutrition of moderate degree 10/13/2016  . Chest pain with moderate risk for cardiac etiology 10/06/2016  . Labile blood pressure   . Stage 3 chronic kidney disease ([place])   . AKI (acute kidney injury) ([place])   . Acute blood loss anemia   . Small vessel disease 09/22/2016  . Hemiparesis affecting left side as late effect of stroke ([place]) 09/22/2016  . Gait disturbance, post-stroke 09/22/2016  . Stroke (cerebrum) ([place]) 09/20/2016  . Diabetes mellitus with complication ([place])   . Ischemic stroke ([place])   . Status post intraocular lens implant 06/23/2011  . Glaucoma suspect 12/30/2010  . Macular hole 12/30/2010  . Nonproliferative diabetic retinopathy ([place]) 12/30/2010  . Syncope 06/17/2010  . CARDIOMYOPATHY 04/15/2010  . PLEURAL EFFUSION 04/14/2010  . SHORTNESS OF BREATH 04/14/2010  . CORONARY ATHEROSCLEROSIS NATIVE CORONARY ARTERY 01/19/2009  . Hyperlipidemia 01/06/2009  . Diabetes mellitus ([place]) 01/05/2009  . Essential hypertension 01/05/2009  . Coronary atherosclerosis 01/05/2009    Rexanne Mano, PT 01/02/2017, 3:19 PM  [facility] [address] [place], [place], [address] Phone: [phone]   Fax:  [phone]  Name: [name] MRN: [id number] Date of Birth: January 15, 1928

## 2017-01-02 NOTE — Therapy (Signed)
[facility] [address] [place], [place], [address] Phone: [phone]   Fax:  [phone]  Occupational Therapy Evaluation  Patient Details  Name: [name] MRN: [id number] Date of Birth: [birth year] Referring Provider: Dr. Alysia Penna   Encounter Date: 01/02/2017  OT End of Session - 01/02/17 1257    Visit Number  1    Number of Visits  17    Date for OT Re-Evaluation  03/03/17    Authorization Type  UHC ?Medicare, no auth/no visit limit    Authorization - Visit Number  1    Authorization - Number of Visits  10    OT Start Time  1022    OT Stop Time  1101    OT Time Calculation (min)  39 min    Activity Tolerance  Patient tolerated treatment well    Behavior During Therapy  WFL for tasks assessed/performed       Past Medical History:  Diagnosis Date  . Acute renal injury ([place]) 07/2016  . Anginal pain ([place]) 2004; 2011  . CAD (coronary artery disease)    status post stenting of the RCA status post CABG. (left internal mammary artery to  LAD, saphenous vein graft to second diagonal, saphenous vein  graft to obtuse marginal 1, saphenous vein graft to posterior  descending).  . Cardiomyopathy     Mildly reduced EF (45%).   . Chronic kidney disease (CKD), stage III (moderate) ([place])   . History of blood transfusion 1960s   "related to anemia"  . HTN (hypertension)   . Myocardial infarction ([place]) 2011  . Pleural effusion 2011   S/P "bypass"  . Shortness of breath   . Stroke ([place]) 09/19/2016   "left sided weakness" (09/20/2016)  . Type II diabetes mellitus ([place])     Past Surgical History:  Procedure Laterality Date  . CATARACT EXTRACTION W/ INTRAOCULAR LENS  IMPLANT, BILATERAL Bilateral   . CORONARY ANGIOPLASTY WITH STENT PLACEMENT  2004   "LAD & one of the circumflex"  . CORONARY ARTERY BYPASS GRAFT  2011   CABG X4    There were no vitals filed for this visit.  Subjective Assessment - 01/02/17  1027    Subjective   "I want to get stronger"    Pertinent History  CVA 09/20/16; CAD with CABG 2011, diabetes mellitus, CKD stage 3, HTN, hyperlipidemia, macular hole    Limitations  fall risk    Patient Stated Goals  walk without device, strengthen LUE    Currently in Pain?  No/denies        [facility] OT Assessment - 01/02/17 0001      Assessment   Diagnosis  CVA with L hemiparesis    Referring Provider  Dr. Alysia Penna    Onset Date  09/20/16    Prior Therapy  CIR, Home therapies      Precautions   Precautions  Fall      Balance Screen   Has the patient fallen in the past 6 months  No      Home  Environment   Family/patient expects to be discharged to:  Private residence    Home Layout  Multi-level stays on first floor    Lives With  Family nephew, dtr also comes in and out      Prior Function   Level of Independence  Independent with basic ADLs;Independent with community mobility without device;Independent with homemaking with ambulation    Leisure  exercised 3x week with  trainer      ADL   Eating/Feeding  Modified independent    Grooming  Modified independent    Upper Body Bathing  Modified independent    Lower Body Bathing  Modified independent    Upper Body Dressing  -- mod I    Lower Body Dressing  Modified independent    Toilet Transfer  Modified independent    Toileting - Clothing Manipulation  Modified independent    Toileting -  Engineer, drilling seat without back;Grab bars      IADL   Prior Level of Function Shopping  did some, but didn't do a lot (family helped)    Shopping  -- goes out a little--uses transport chair    Prior Level of Function Light Housekeeping  cleaning lady, did light tasks    Light Housekeeping  -- folded clothes    Prior Level of Function Meal Prep  went out to eat a lot, little cooking    Meal Prep  -- warms up food, boils eggs     Prior Level of Function Community Mobility  indpendent with driving    Community Mobility  Relies on family or friends for transportation    Medication Management  Is responsible for taking medication in correct dosages at correct time pill box      Mobility   Mobility Status Comments  4-wheeled walker without seat, but using cane some       Written Expression   Dominant Hand  Right      Vision - History   Baseline Vision  -- wore glasses for driving and reading (3 sets of glasses)    Visual History  Cataracts hx of bilateralcataracts surgery     Additional Comments  Pt denies changes from CVA      Activity Tolerance   Activity Tolerance Comments  decr for functional tasks, not walking in community      Cognition   Overall Cognitive Status  Within Functional Limits for tasks assessed      Sensation   Light Touch  Appears Intact pt denies changes      Coordination   9 Hole Peg Test  Right;Left    Right 9 Hole Peg Test  44.0    Left 9 Hole Peg Test  49.38      ROM / Strength   AROM / PROM / Strength  AROM;Strength      AROM   Overall AROM   Deficits    Overall AROM Comments  mild decr end range L shoulder flex due to weakness      Strength   Overall Strength  Deficits    Overall Strength Comments  L shoulder strength grossly 4-/5, biceps/triceps 4/5.  R shoulder strength grossly 3/5, biceps/triceps 4-/5    Strength Assessment Site  --    Right/Left Shoulder  --      Hand Function   Right Hand Grip (lbs)  35    Right Hand Lateral Pinch  13 lbs    Right Hand 3 Point Pinch  -- tip 8lbs    Left Hand Grip (lbs)  25    Left Hand Lateral Pinch  12 lbs    Left 3 point pinch  -- tip 8lbs                      OT Education - 01/02/17 1254  Education provided  Yes    Education Details  OT POC/eval results    Person(s) Educated  Patient    Methods  Explanation    Comprehension  Verbalized understanding       OT Short Term Goals - 01/02/17 1311      OT  SHORT TERM GOAL #1   Title  Pt will be independent with initial HEP.--check STGs 02/01/16    Time  4    Period  Weeks    Status  New      OT SHORT TERM GOAL #2   Title  Pt will perform simple home maintenance tasks in standing mod I.    Time  4    Period  Weeks    Status  New      OT SHORT TERM GOAL #3   Title  Pt will improve LUE strength as shown by ability to retrieve 2lb object from overhead shelf with LUE safely x5.    Time  4    Period  Weeks    Status  New        OT Long Term Goals - 01/02/17 1312      OT LONG TERM GOAL #1   Title  Pt will be independent with updated HEP.--check LTGs 03/03/16    Time  8    Period  Weeks    Status  New      OT LONG TERM GOAL #2   Title  Pt will perform simple cooking tasks mod I in standing without device.    Time  8    Period  Weeks    Status  New      OT LONG TERM GOAL #3   Title  Pt will improve L grip strength by at least 4lbs to assist in opening containers/lifting objects.    Baseline  25lbs    Time  8    Period  Weeks    Status  New      OT LONG TERM GOAL #4   Title  Pt will improve LUE strength and balance as shown by ability to retrieve at least 5lb object from overhead shelf with BUEs.    Time  8    Period  Weeks    Status  New            Plan - 01/02/17 1301    Clinical Impression Statement  Pt presents today with L hemiparesis with decr strength, decr LUE functional use, decr balance/functional mobility for ADLs/IADLs.  Pt would benefit from occupational therapy to address these deficits for incr independence with ADLs/IADLs and improved LUE functional use.    Occupational Profile and client history currently impacting functional performance  Pt is a 83 y.o. female s/p CVA with L hemiparesis (09/20/16).  Pt with PMH that includes:  CAD with CABG 2011, diabetes mellitus, CKD stage 3, HTN, hyperlipidemia, macular hole.  Pt was independent with mobility, ADLs/IADLs prior to CVA, but is now limited with IADLs and  functional mobility.     Occupational performance deficits (Please refer to evaluation for details):  ADL's;IADL's;Leisure    Rehab Potential  Good    OT Frequency  2x / week    OT Duration  8 weeks +eval    OT Treatment/Interventions  Self-care/ADL training;Moist Heat;DME and/or AE instruction;Therapeutic activities;Therapeutic exercise;Functional Mobility Training;Neuromuscular education;Energy conservation;Manual Therapy;Passive range of motion;Patient/family education;Cryotherapy;Balance training    Plan  initiate HEP for LUE strengthening (pt has weights at home)    Clinical Decision  Making  Limited treatment options, no task modification necessary    Consulted and Agree with Plan of Care  Patient       Patient will benefit from skilled therapeutic intervention in order to improve the following deficits and impairments:  Decreased knowledge of use of DME, Decreased mobility, Decreased coordination, Decreased activity tolerance, Decreased endurance, Decreased strength, Impaired UE functional use, Decreased balance  Visit Diagnosis: Hemiplegia and hemiparesis following cerebral infarction affecting left non-dominant side (HCC)  Muscle weakness (generalized)  Unsteadiness on feet  Other abnormalities of gait and mobility  G-Codes - 05-Jan-2017 1316    Functional Assessment Tool Used (Outpatient only)  IADL performance    Functional Limitation  Self care    Self Care Current Status (M2500)  At least 40 percent but less than 60 percent impaired, limited or restricted    Self Care Goal Status (B7048)  At least 1 percent but less than 20 percent impaired, limited or restricted       Problem List Patient Active Problem List   Diagnosis Date Noted  . Rectal bleed 10/13/2016  . Malnutrition of moderate degree 10/13/2016  . Chest pain with moderate risk for cardiac etiology 10/06/2016  . Labile blood pressure   . Stage 3 chronic kidney disease ([place])   . AKI (acute kidney injury) ([place])    . Acute blood loss anemia   . Small vessel disease 09/22/2016  . Hemiparesis affecting left side as late effect of stroke ([place]) 09/22/2016  . Gait disturbance, post-stroke 09/22/2016  . Stroke (cerebrum) ([place]) 09/20/2016  . Diabetes mellitus with complication ([place])   . Ischemic stroke ([place])   . Status post intraocular lens implant 06/23/2011  . Glaucoma suspect 12/30/2010  . Macular hole 12/30/2010  . Nonproliferative diabetic retinopathy ([place]) 12/30/2010  . Syncope 06/17/2010  . CARDIOMYOPATHY 04/15/2010  . PLEURAL EFFUSION 04/14/2010  . SHORTNESS OF BREATH 04/14/2010  . CORONARY ATHEROSCLEROSIS NATIVE CORONARY ARTERY 01/19/2009  . Hyperlipidemia 01/06/2009  . Diabetes mellitus ([place]) 01/05/2009  . Essential hypertension 01/05/2009  . Coronary atherosclerosis 01/05/2009    [place] 05-Jan-2017, 1:17 PM  [facility] [address] [place], [place], [address] Phone: [phone]   Fax:  [phone]  Name: Abbye Lao MRN: [id number] Date of Birth: 09-17-1927   Vianne Bulls, OTR/L [facility] [address] [place], [place]  [address] [phone] phone [phone] 01-05-2017 1:17 PM

## 2017-01-05 ENCOUNTER — Telehealth: Payer: Self-pay | Admitting: Neurology

## 2017-01-05 NOTE — Telephone Encounter (Signed)
Pt is requesting a letter stating she was in the hospital from 8/21 - 10/06/16. Pt said she needs this sent to her, address confirmed. Pt would not state why she needed the letter

## 2017-01-05 NOTE — Telephone Encounter (Signed)
Pt schedule for appt. The reason was given for the letter. Letter will be done by Dr. Leonie Man and put in the mail.

## 2017-01-05 NOTE — Telephone Encounter (Signed)
Letter put in the mail to patients address listed.

## 2017-01-05 NOTE — Telephone Encounter (Signed)
If patient calls back she needs to schedule a hospital follow up. We need to know what the letter is for. Letter cannot be done until she is seen in the office. Pt was last seen by Dr. Leonie Man in 10/06/2016.  VM was left for patient to call back to schedule hospital follow up.

## 2017-01-05 NOTE — Telephone Encounter (Addendum)
Pt returned RN's call. Pt said she missed a trip due to being in the hospital. Pt's son got on the phone, Dr Erick Alley [phone] (he is visiting from [place], he is a Psychiatric nurse). He said she missed a trip to [place] and that is the reason for the letter. It needs to state she was under Dr Leonie Man care at the hospital. Dr Mayer Masker said if the letter cannot be done he would need to speak with Dr Leonie Man personally.  Dr Mayer Masker also wanted DR Leonie Man to know he is very appreciative of the care his mother was given while in the hospital

## 2017-01-16 NOTE — Telephone Encounter (Signed)
Letter was mail 01/05/2017 to patients listed address. Pts son wants a copy of letter. There is no dpr on file because pt was seen in hospital has appt in January 2018. Pts son Juanda Crumble will come by to pick up letter on 01/17/2017.

## 2017-01-16 NOTE — Telephone Encounter (Signed)
Dr Mayer Masker called to inquire about the letter.  I shared with him last entry from [place] that the letter was sent out 12-06.  Dr Mayer Masker stated he will come to office for a copy of letter on 12-18 if it is not received in mail today due to being needed for a deadline in 2 days. No call back requested

## 2017-01-17 ENCOUNTER — Ambulatory Visit: Payer: Medicare Other | Admitting: Occupational Therapy

## 2017-01-17 ENCOUNTER — Ambulatory Visit: Payer: Medicare Other | Admitting: Physical Therapy

## 2017-01-19 ENCOUNTER — Ambulatory Visit: Payer: Medicare Other | Admitting: Physical Therapy

## 2017-01-19 ENCOUNTER — Ambulatory Visit: Payer: Medicare Other | Admitting: Occupational Therapy

## 2017-01-19 ENCOUNTER — Encounter: Payer: Self-pay | Admitting: Physical Therapy

## 2017-01-19 VITALS — BP 166/73 | HR 60

## 2017-01-19 DIAGNOSIS — R2689 Other abnormalities of gait and mobility: Secondary | ICD-10-CM | POA: Diagnosis not present

## 2017-01-19 DIAGNOSIS — M6281 Muscle weakness (generalized): Secondary | ICD-10-CM

## 2017-01-19 DIAGNOSIS — R2681 Unsteadiness on feet: Secondary | ICD-10-CM

## 2017-01-19 DIAGNOSIS — I69354 Hemiplegia and hemiparesis following cerebral infarction affecting left non-dominant side: Secondary | ICD-10-CM

## 2017-01-19 NOTE — Therapy (Signed)
[facility] [address] [place], [place], [address] Phone: [phone]   Fax:  [phone]  Occupational Therapy Treatment  Patient Details  Name: [name] MRN: [id number] Date of Birth: April 05, 1927 Referring Provider (Historical): Dr. Alysia Penna   Encounter Date: 01/19/2017  OT End of Session - 01/19/17 1622    Visit Number  2    Number of Visits  17    Date for OT Re-Evaluation  03/03/17    Authorization Type  UHC ?Medicare, no auth/no visit limit    Authorization - Visit Number  2    Authorization - Number of Visits  10    OT Start Time  1505 pt late    OT Stop Time  1530    OT Time Calculation (min)  35 min    Activity Tolerance  Patient tolerated treatment well    Behavior During Therapy  WFL for tasks assessed/performed       Past Medical History:  Diagnosis Date  . Acute renal injury ([place]) 07/2016  . Anginal pain ([place]) 2004; 2011  . CAD (coronary artery disease)    status post stenting of the RCA status post CABG. (left internal mammary artery to  LAD, saphenous vein graft to second diagonal, saphenous vein  graft to obtuse marginal 1, saphenous vein graft to posterior  descending).  . Cardiomyopathy     Mildly reduced EF (45%).   . Chronic kidney disease (CKD), stage III (moderate) ([place])   . History of blood transfusion 1960s   "related to anemia"  . HTN (hypertension)   . Myocardial infarction ([place]) 2011  . Pleural effusion 2011   S/P "bypass"  . Shortness of breath   . Stroke ([place]) 09/19/2016   "left sided weakness" (09/20/2016)  . Type II diabetes mellitus ([place])     Past Surgical History:  Procedure Laterality Date  . CATARACT EXTRACTION W/ INTRAOCULAR LENS  IMPLANT, BILATERAL Bilateral   . CORONARY ANGIOPLASTY WITH STENT PLACEMENT  2004   "LAD & one of the circumflex"  . CORONARY ARTERY BYPASS GRAFT  2011   CABG X4    There were no vitals filed for this visit.  Subjective  Assessment - 01/19/17 1626    Pertinent History  CVA 09/20/16; CAD with CABG 2011, diabetes mellitus, CKD stage 3, HTN, hyperlipidemia, macular hole    Patient Stated Goals  walk without device, strengthen LUE    Currently in Pain?  No/denies           Treatment: supine cane exercises for chest press, and shoulder flexion, min facilitation/ v.c for LUE positioning. Seated AA/ROM shoulder flexion and horizontal abduction with hemiglide, min facilitation. Arm bike x 6 mins level 1 for reciprocal movement. Pt ambulated to and from mat with walker and close supervision.                  OT Short Term Goals - 01/02/17 1311      OT SHORT TERM GOAL #1   Title  Pt will be independent with initial HEP.--check STGs 02/01/16    Time  4    Period  Weeks    Status  New      OT SHORT TERM GOAL #2   Title  Pt will perform simple home maintenance tasks in standing mod I.    Time  4    Period  Weeks    Status  New      OT SHORT TERM GOAL #3  Title  Pt will improve LUE strength as shown by ability to retrieve 2lb object from overhead shelf with LUE safely x5.    Time  4    Period  Weeks    Status  New        OT Long Term Goals - 01/02/17 1312      OT LONG TERM GOAL #1   Title  Pt will be independent with updated HEP.--check LTGs 03/03/16    Time  8    Period  Weeks    Status  New      OT LONG TERM GOAL #2   Title  Pt will perform simple cooking tasks mod I in standing without device.    Time  8    Period  Weeks    Status  New      OT LONG TERM GOAL #3   Title  Pt will improve L grip strength by at least 4lbs to assist in opening containers/lifting objects.    Baseline  25lbs    Time  8    Period  Weeks    Status  New      OT LONG TERM GOAL #4   Title  Pt will improve LUE strength and balance as shown by ability to retrieve at least 5lb object from overhead shelf with BUEs.    Time  8    Period  Weeks    Status  New            Plan - 01/19/17 1623     Clinical Impression Statement  Pt is progressing towards goals. She demonstrates compensations when reaching with LUE, and therefore therapist recommended that she does not use weights at home.    Rehab Potential  Good    OT Frequency  2x / week    OT Duration  8 weeks    OT Treatment/Interventions  Self-care/ADL training;Moist Heat;DME and/or AE instruction;Therapeutic activities;Therapeutic exercise;Functional Mobility Training;Neuromuscular education;Energy conservation;Manual Therapy;Passive range of motion;Patient/family education;Cryotherapy;Balance training    Plan  issue supine cane exercises for home    Consulted and Agree with Plan of Care  Patient       Patient will benefit from skilled therapeutic intervention in order to improve the following deficits and impairments:  Decreased knowledge of use of DME, Decreased mobility, Decreased coordination, Decreased activity tolerance, Decreased endurance, Decreased strength, Impaired UE functional use, Decreased balance  Visit Diagnosis: Hemiplegia and hemiparesis following cerebral infarction affecting left non-dominant side (HCC)  Muscle weakness (generalized)  Unsteadiness on feet    Problem List Patient Active Problem List   Diagnosis Date Noted  . Rectal bleed 10/13/2016  . Malnutrition of moderate degree 10/13/2016  . Chest pain with moderate risk for cardiac etiology 10/06/2016  . Labile blood pressure   . Stage 3 chronic kidney disease ([place])   . AKI (acute kidney injury) ([place])   . Acute blood loss anemia   . Small vessel disease 09/22/2016  . Hemiparesis affecting left side as late effect of stroke ([place]) 09/22/2016  . Gait disturbance, post-stroke 09/22/2016  . Stroke (cerebrum) ([place]) 09/20/2016  . Diabetes mellitus with complication ([place])   . Ischemic stroke ([place])   . Status post intraocular lens implant 06/23/2011  . Glaucoma suspect 12/30/2010  . Macular hole 12/30/2010  . Nonproliferative diabetic retinopathy  ([place]) 12/30/2010  . Syncope 06/17/2010  . CARDIOMYOPATHY 04/15/2010  . PLEURAL EFFUSION 04/14/2010  . SHORTNESS OF BREATH 04/14/2010  . CORONARY ATHEROSCLEROSIS NATIVE CORONARY ARTERY 01/19/2009  .  Hyperlipidemia 01/06/2009  . Diabetes mellitus ([place]) 01/05/2009  . Essential hypertension 01/05/2009  . Coronary atherosclerosis 01/05/2009    RINE,KATHRYN 01/19/2017, 4:27 PM  [facility] [address] [place], [place], [address] Phone: [phone]   Fax:  [phone]  Name: [name] MRN: [id number] Date of Birth: 1927-03-07

## 2017-01-19 NOTE — Patient Instructions (Signed)
HIP: Abduction - Standing    Stand at the counter, hands resting lightly for balance. Stand straight and raise leg out to the side and slowly return. Then lift the opposite leg.  __20_ reps per set, __1_ sets per day, __5_ days per week  Copyright  VHI. All rights reserved.    Sit to Stand Transfers:  1. Scoot out to the edge of the chair 2. Place your feet flat on the floor, shoulder width apart.  Make sure your feet are tucked just under your knees. 3. Lean forward (nose over toes) with momentum, and stand up tall with your best posture.  If you need to use your arms, use them as a quick boost up to stand. 4. If you are in a low or soft chair, you can lean back and then forward up to stand, in order to get more momentum. 5. Once you are standing, make sure you are looking ahead and standing tall.  To sit down:  1. Back up until you feel the chair behind your legs. 2. Bend at you hips, reaching  Back for you chair, if needed, then slowly squat to sit down on your chair.  HIP / KNEE: Extension - Sit to Stand    Sitting in a chair that is a bit hard to stand from. Do not use the armrests, but put your hands on your knees. Lean chest forward, raise hips up from surface. Straighten hips and knees. Weight bear equally on left and right sides. Backs of legs should not push off surface. __10_ reps per set, __1_ sets per day, __5_ days per week Use assistive device as needed.  Copyright  VHI. All rights reserved.     ANKLE: Dorsiflexion, Alternate - Sitting    Sit at edge of sitting surface, feet on floor. Raise front of one foot, then other foot, keeping heels down. Alternate. __20_ reps per set, __3_ sets per day, __5_ days per week   Copyright  VHI. All rights reserved.    Bridge    Lie back, legs bent. Tighten your stomach and then lift hips up off the bed. Hold and count out loud to 5 then lower slowly.  Repeat __10__ times. Do __1__ sessions per  day.  http://pm.exer.us/55   Copyright  VHI. All rights reserved.    Heel Raise: Bilateral (Standing)    Stand at counter with hands lightly on counter. Rise slowly up onto balls of feet. Hold for a count of 3 and then lower slowly. Repeat __10__ times per set. Do __1__ sets per session. Do __5__ sessions per week.  http://orth.exer.us/39   Copyright  VHI. All rights reserved.

## 2017-01-19 NOTE — Therapy (Signed)
[facility] [address] [place], [place], [address] Phone: [phone]   Fax:  [phone]  Physical Therapy Treatment  Patient Details  Name: [name] MRN: [id number] Date of Birth: 1927/11/15 Referring Provider: Alysia Penna MD   Encounter Date: 01/19/2017  PT End of Session - 01/19/17 1631    Visit Number  2    Number of Visits  17    Date for PT Re-Evaluation  03/03/17    Authorization Type  01/02/17-03/03/17    PT Start Time  1450    PT Stop Time  1530    PT Time Calculation (min)  40 min    Activity Tolerance  Patient tolerated treatment well    Behavior During Therapy  [facility] for tasks assessed/performed       Past Medical History:  Diagnosis Date  . Acute renal injury ([place]) 07/2016  . Anginal pain ([place]) 2004; 2011  . CAD (coronary artery disease)    status post stenting of the RCA status post CABG. (left internal mammary artery to  LAD, saphenous vein graft to second diagonal, saphenous vein  graft to obtuse marginal 1, saphenous vein graft to posterior  descending).  . Cardiomyopathy     Mildly reduced EF (45%).   . Chronic kidney disease (CKD), stage III (moderate) ([place])   . History of blood transfusion 1960s   "related to anemia"  . HTN (hypertension)   . Myocardial infarction ([place]) 2011  . Pleural effusion 2011   S/P "bypass"  . Shortness of breath   . Stroke ([place]) 09/19/2016   "left sided weakness" (09/20/2016)  . Type II diabetes mellitus ([place])     Past Surgical History:  Procedure Laterality Date  . CATARACT EXTRACTION W/ INTRAOCULAR LENS  IMPLANT, BILATERAL Bilateral   . CORONARY ANGIOPLASTY WITH STENT PLACEMENT  2004   "LAD & one of the circumflex"  . CORONARY ARTERY BYPASS GRAFT  2011   CABG X4    Vitals:   01/19/17 1534  BP: (!) 166/73  Pulse: 60    Subjective Assessment - 01/19/17 1532    Subjective  Reports she could not get an earlier appointment and that caused  break in time between PT eval and today. Has been using RW at home mostly; occasionally using cane but not well. No falls.     Pertinent History  CAD, CABG, DM, CKD, cardiomyopathy, HTN    Patient Stated Goals  Be able to walk with a SPC or no device at all    Currently in Pain?  No/denies       Treatment- See HEP instructions provided today. Completed all exercises and reps to ensure patient can perform properly.   HIP: Abduction - Standing    Stand at the counter, hands resting lightly for balance. Stand straight and raise leg out to the side and slowly return. Then lift the opposite leg.  __20_ reps per set, __1_ sets per day, __5_ days per week  Copyright  VHI. All rights reserved.    Sit to Stand Transfers:  1. Scoot out to the edge of the chair 2. Place your feet flat on the floor, shoulder width apart.  Make sure your feet are tucked just under your knees. 3. Lean forward (nose over toes) with momentum, and stand up tall with your best posture.  If you need to use your arms, use them as a quick boost up to stand. 4. If you are in a low or soft chair,  you can lean back and then forward up to stand, in order to get more momentum. 5. Once you are standing, make sure you are looking ahead and standing tall.  To sit down:  1. Back up until you feel the chair behind your legs. 2. Bend at you hips, reaching  Back for you chair, if needed, then slowly squat to sit down on your chair.  HIP / KNEE: Extension - Sit to Stand    Sitting in a chair that is a bit hard to stand from. Do not use the armrests, but put your hands on your knees. Lean chest forward, raise hips up from surface. Straighten hips and knees. Weight bear equally on left and right sides. Backs of legs should not push off surface. __10_ reps per set, __1_ sets per day, __5_ days per week Use assistive device as needed.  Copyright  VHI. All rights reserved.     ANKLE: Dorsiflexion, Alternate -  Sitting    Sit at edge of sitting surface, feet on floor. Raise front of one foot, then other foot, keeping heels down. Alternate. __20_ reps per set, __3_ sets per day, __5_ days per week   Copyright  VHI. All rights reserved.    Bridge    Lie back, legs bent. Tighten your stomach and then lift hips up off the bed. Hold and count out loud to 5 then lower slowly.  Repeat __10__ times. Do __1__ sessions per day.  http://pm.exer.us/55   Copyright  VHI. All rights reserved.    Heel Raise: Bilateral (Standing)    Stand at counter with hands lightly on counter. Rise slowly up onto balls of feet. Hold for a count of 3 and then lower slowly. Repeat __10__ times per set. Do __1__ sets per session. Do __5__ sessions per week.  http://orth.exer.us/39   Copyright  VHI. All rights reserved.                           PT Education - 01/19/17 1630    Education provided  Yes    Education Details  see HEP    Person(s) Educated  Patient    Methods  Explanation;Demonstration;Handout    Comprehension  Verbalized understanding;Returned demonstration;Need further instruction       PT Short Term Goals - 01/02/17 1458      PT SHORT TERM GOAL #1   Title  Patient will be independent with HEP. (Target for all STGs 02/02/2016    Time  4    Period  Weeks    Status  New    Target Date  02/01/17      PT SHORT TERM GOAL #2   Title  Patient will ambulate modified independent with LRAD x 300 ft with velocity averaging >1.5 ft/sec.     Time  4    Period  Weeks    Status  New      PT SHORT TERM GOAL #3   Title  Patient will improve 5x sit to stand time to <=15 seconds (indicative of incr strength, balance, and lesser fall risk)    Time  4    Period  Weeks    Status  New      PT SHORT TERM GOAL #4   Title  Patient will decrease TUG time to <25.0 seconds, demonstrating a reducing fall risk.     Time  4    Period  Weeks    Status  New  PT Long Term  Goals - 01/02/17 1504      PT LONG TERM GOAL #1   Title  Patient will be independent with updated HEP. (Target for all STGs 2/01/019)    Time  8    Period  Weeks    Status  New    Target Date  03/03/17      PT LONG TERM GOAL #2   Title  Patient will ambulate modified independent vs independent with LRAD on level indoor and outdoor surfaces (paved walk, paved parking) x 400 ft.     Time  8    Period  Weeks    Status  New      PT LONG TERM GOAL #3   Title  Patient will improve 5x sit to stand time to <=12 sec to demonstrate lesser fall risk    Time  8    Period  Weeks    Status  New      PT LONG TERM GOAL #4   Title  Patient will improve her TUG to <=20 seconds-demonstrating a lesser fall risk.     Time  8    Period  Weeks    Status  New      PT LONG TERM GOAL #5   Title  Patient will ambulate with LRAD with velocity >=1.8 ft/sec    Time  8    Period  Weeks    Status  New            Plan - 01/19/17 1632    Clinical Impression Statement  Session focused on establishing a HEP for LE strengthening and balance. Also reviewed safe ambulation with RW as pt walked into clinic holding onto her nephew. Patient in agreement to use RW and not try to use her cane. Patient very motivated and appreciative of PT's efforts.     Rehab Potential  Good    Clinical Impairments Affecting Rehab Potential  motivated, HTN     PT Frequency  2x / week    PT Duration  8 weeks    PT Treatment/Interventions  ADLs/Self Care Home Management;DME Instruction;Gait training;Neuromuscular re-education;Balance training;Therapeutic exercise;Therapeutic activities;Functional mobility training;Stair training;Patient/family education    PT Next Visit Plan  Check BP; Assess her HEP from 12/20 ?add any other standing balance ex's; gait training    Consulted and Agree with Plan of Care  Patient       Patient will benefit from skilled therapeutic intervention in order to improve the following deficits and  impairments:  Abnormal gait, Cardiopulmonary status limiting activity, Decreased balance, Decreased mobility, Decreased knowledge of use of DME, Decreased endurance, Decreased coordination, Decreased strength, Increased edema, Difficulty walking  Visit Diagnosis: Muscle weakness (generalized)  Unsteadiness on feet  Other abnormalities of gait and mobility     Problem List Patient Active Problem List   Diagnosis Date Noted  . Rectal bleed 10/13/2016  . Malnutrition of moderate degree 10/13/2016  . Chest pain with moderate risk for cardiac etiology 10/06/2016  . Labile blood pressure   . Stage 3 chronic kidney disease ([place])   . AKI (acute kidney injury) ([place])   . Acute blood loss anemia   . Small vessel disease 09/22/2016  . Hemiparesis affecting left side as late effect of stroke ([place]) 09/22/2016  . Gait disturbance, post-stroke 09/22/2016  . Stroke (cerebrum) ([place]) 09/20/2016  . Diabetes mellitus with complication ([place])   . Ischemic stroke ([place])   . Status post intraocular lens implant 06/23/2011  . Glaucoma  suspect 12/30/2010  . Macular hole 12/30/2010  . Nonproliferative diabetic retinopathy ([place]) 12/30/2010  . Syncope 06/17/2010  . CARDIOMYOPATHY 04/15/2010  . PLEURAL EFFUSION 04/14/2010  . SHORTNESS OF BREATH 04/14/2010  . CORONARY ATHEROSCLEROSIS NATIVE CORONARY ARTERY 01/19/2009  . Hyperlipidemia 01/06/2009  . Diabetes mellitus ([place]) 01/05/2009  . Essential hypertension 01/05/2009  . Coronary atherosclerosis 01/05/2009    Rexanne Mano, PT 01/19/2017, 4:36 PM  [facility] [address] [place], [place], [address] Phone: [phone]   Fax:  [phone]  Name: [name] MRN: [id number] Date of Birth: 12/30/1927

## 2017-01-25 ENCOUNTER — Encounter: Payer: Self-pay | Admitting: Physical Therapy

## 2017-01-25 ENCOUNTER — Ambulatory Visit: Payer: Medicare Other | Admitting: Physical Therapy

## 2017-01-25 VITALS — BP 163/75 | HR 60

## 2017-01-25 DIAGNOSIS — M6281 Muscle weakness (generalized): Secondary | ICD-10-CM

## 2017-01-25 DIAGNOSIS — R2689 Other abnormalities of gait and mobility: Secondary | ICD-10-CM | POA: Diagnosis not present

## 2017-01-25 DIAGNOSIS — R2681 Unsteadiness on feet: Secondary | ICD-10-CM

## 2017-01-26 NOTE — Therapy (Signed)
[facility] [address] [place], [place], [address] Phone: [phone]   Fax:  [phone]  Physical Therapy Treatment  Patient Details  Name: [name] MRN: [id number] Date of Birth: [birth year] Referring Provider: Alysia Penna MD   Encounter Date: 01/25/2017  PT End of Session - 01/25/17 1323    Visit Number  3    Number of Visits  17    Date for PT Re-Evaluation  03/03/17    Authorization Type  01/02/17-03/03/17    PT Start Time  1319    PT Stop Time  1400    PT Time Calculation (min)  41 min    Equipment Utilized During Treatment  Gait belt    Activity Tolerance  Patient tolerated treatment well    Behavior During Therapy  [facility] for tasks assessed/performed       Past Medical History:  Diagnosis Date  . Acute renal injury ([place]) 07/2016  . Anginal pain ([place]) 2004; 2011  . CAD (coronary artery disease)    status post stenting of the RCA status post CABG. (left internal mammary artery to  LAD, saphenous vein graft to second diagonal, saphenous vein  graft to obtuse marginal 1, saphenous vein graft to posterior  descending).  . Cardiomyopathy     Mildly reduced EF (45%).   . Chronic kidney disease (CKD), stage III (moderate) ([place])   . History of blood transfusion 1960s   "related to anemia"  . HTN (hypertension)   . Myocardial infarction ([place]) 2011  . Pleural effusion 2011   S/P "bypass"  . Shortness of breath   . Stroke ([place]) 09/19/2016   "left sided weakness" (09/20/2016)  . Type II diabetes mellitus ([place])     Past Surgical History:  Procedure Laterality Date  . CATARACT EXTRACTION W/ INTRAOCULAR LENS  IMPLANT, BILATERAL Bilateral   . CORONARY ANGIOPLASTY WITH STENT PLACEMENT  2004   "LAD & one of the circumflex"  . CORONARY ARTERY BYPASS GRAFT  2011   CABG X4    Vitals:   01/25/17 1326  BP: (!) 163/75  Pulse: 60    Subjective Assessment - 01/25/17 1322    Subjective  No new  compliants. No falls or pain to report. To session today without RW, was in clinic wheelchair when PTA went to get pt in lobby. Reports "it was too cold to mess with that thing."    Patient is accompained by:  Family member    Pertinent History  CAD, CABG, DM, CKD, cardiomyopathy, HTN    Patient Stated Goals  Be able to walk with a SPC or no device at all    Currently in Pain?  No/denies           [facility] Adult PT Treatment/Exercise - 01/25/17 1330      Transfers   Transfers  Sit to Stand;Stand to [employer] Transfers    Sit to Stand  5: Supervision;With upper extremity assist;From bed;From chair/3-in-1    Sit to Stand Details  Verbal cues for sequencing;Verbal cues for precautions/safety;Verbal cues for safe use of DME/AE    Stand to Sit  5: Supervision;With upper extremity assist;To bed;To chair/3-in-1    Stand to Sit Details (indicate cue type and reason)  Verbal cues for sequencing;Verbal cues for technique;Verbal cues for safe use of DME/AE    Stand Pivot Transfers  4: Min guard    Stand Pivot Transfer Details (indicate cue type and reason)  from wheelchair to mat table with cues  on hand placement/technique.       Ambulation/Gait   Ambulation/Gait  Yes    Ambulation/Gait Assistance  5: Supervision;4: Min assist;4: Min guard    Ambulation/Gait Assistance Details  supervision with use of RW with cues on posture and for increased step length with gait; min assist with quad cane with cues for sequencing, step length and for increased base of support with gait. pt fatigues quicker with cane use vs RW.     Ambulation Distance (Feet)  115 Feet xx RW, 55 x2 SBQC    Assistive device  Rolling walker;Small based quad cane SBQC= small based quad cane    Gait Pattern  Step-through pattern;Decreased stride length;Narrow base of support;Decreased step length - right;Decreased step length - left;Decreased arm swing - right;Decreased arm swing - left    Ambulation Surface  Level;Indoor    Gait  Comments  pt demo's improved activity tolerance with use of RW. Discussed use of RW at all times with mobility for safety at this time, pt verbalized understanding and agreed to use of RW at this time.       Exercises   Other Exercises   reviewed with pt performing all ex's issued at last session with cues on correct technique/ex form.           PT Short Term Goals - 01/02/17 1458      PT SHORT TERM GOAL #1   Title  Patient will be independent with HEP. (Target for all STGs 02/02/2016    Time  4    Period  Weeks    Status  New    Target Date  02/01/17      PT SHORT TERM GOAL #2   Title  Patient will ambulate modified independent with LRAD x 300 ft with velocity averaging >1.5 ft/sec.     Time  4    Period  Weeks    Status  New      PT SHORT TERM GOAL #3   Title  Patient will improve 5x sit to stand time to <=15 seconds (indicative of incr strength, balance, and lesser fall risk)    Time  4    Period  Weeks    Status  New      PT SHORT TERM GOAL #4   Title  Patient will decrease TUG time to <25.0 seconds, demonstrating a reducing fall risk.     Time  4    Period  Weeks    Status  New        PT Long Term Goals - 01/02/17 1504      PT LONG TERM GOAL #1   Title  Patient will be independent with updated HEP. (Target for all STGs 2/01/019)    Time  8    Period  Weeks    Status  New    Target Date  03/03/17      PT LONG TERM GOAL #2   Title  Patient will ambulate modified independent vs independent with LRAD on level indoor and outdoor surfaces (paved walk, paved parking) x 400 ft.     Time  8    Period  Weeks    Status  New      PT LONG TERM GOAL #3   Title  Patient will improve 5x sit to stand time to <=12 sec to demonstrate lesser fall risk    Time  8    Period  Weeks    Status  New  PT LONG TERM GOAL #4   Title  Patient will improve her TUG to <=20 seconds-demonstrating a lesser fall risk.     Time  8    Period  Weeks    Status  New      PT LONG  TERM GOAL #5   Title  Patient will ambulate with LRAD with velocity >=1.8 ft/sec    Time  8    Period  Weeks    Status  New            Plan - 01/25/17 1323    Clinical Impression Statement  Today's skilled session initially focused on review of HEP issued at last session with minimal cues needed on correct form/technique. Remainder of session addressed gait safety with RW vs SBQC. Pt presents as more stable with RW and with increased activity tolernace with RW at this time. Continued to reinforce use of RW as primary PT did at last visit. Pt is progressing toward goals and should benefit from continued PT to progress toward unmet goals.     Rehab Potential  Good    Clinical Impairments Affecting Rehab Potential  motivated, HTN     PT Frequency  2x / week    PT Duration  8 weeks    PT Treatment/Interventions  ADLs/Self Care Home Management;DME Instruction;Gait training;Neuromuscular re-education;Balance training;Therapeutic exercise;Therapeutic activities;Functional mobility training;Stair training;Patient/family education    PT Next Visit Plan  Check BP; add any other standing balance ex's; gait with RW, balance training    Consulted and Agree with Plan of Care  Patient       Patient will benefit from skilled therapeutic intervention in order to improve the following deficits and impairments:  Abnormal gait, Cardiopulmonary status limiting activity, Decreased balance, Decreased mobility, Decreased knowledge of use of DME, Decreased endurance, Decreased coordination, Decreased strength, Increased edema, Difficulty walking  Visit Diagnosis: Muscle weakness (generalized)  Unsteadiness on feet  Other abnormalities of gait and mobility     Problem List Patient Active Problem List   Diagnosis Date Noted  . Rectal bleed 10/13/2016  . Malnutrition of moderate degree 10/13/2016  . Chest pain with moderate risk for cardiac etiology 10/06/2016  . Labile blood pressure   . Stage 3  chronic kidney disease ([place])   . AKI (acute kidney injury) ([place])   . Acute blood loss anemia   . Small vessel disease 09/22/2016  . Hemiparesis affecting left side as late effect of stroke ([place]) 09/22/2016  . Gait disturbance, post-stroke 09/22/2016  . Stroke (cerebrum) ([place]) 09/20/2016  . Diabetes mellitus with complication ([place])   . Ischemic stroke ([place])   . Status post intraocular lens implant 06/23/2011  . Glaucoma suspect 12/30/2010  . Macular hole 12/30/2010  . Nonproliferative diabetic retinopathy ([place]) 12/30/2010  . Syncope 06/17/2010  . CARDIOMYOPATHY 04/15/2010  . PLEURAL EFFUSION 04/14/2010  . SHORTNESS OF BREATH 04/14/2010  . CORONARY ATHEROSCLEROSIS NATIVE CORONARY ARTERY 01/19/2009  . Hyperlipidemia 01/06/2009  . Diabetes mellitus ([place]) 01/05/2009  . Essential hypertension 01/05/2009  . Coronary atherosclerosis 01/05/2009    Willow Ora, PTA, [facility] [address] [place], [place] [address] [phone] 01/26/17, 4:12 PM   Name: [name] MRN: [id number] Date of Birth: 14-Dec-1927

## 2017-01-27 ENCOUNTER — Ambulatory Visit: Payer: Medicare Other | Admitting: Physical Therapy

## 2017-01-27 ENCOUNTER — Encounter: Payer: Self-pay | Admitting: Physical Therapy

## 2017-01-27 VITALS — BP 170/73 | HR 65

## 2017-01-27 DIAGNOSIS — R2689 Other abnormalities of gait and mobility: Secondary | ICD-10-CM | POA: Diagnosis not present

## 2017-01-27 DIAGNOSIS — M6281 Muscle weakness (generalized): Secondary | ICD-10-CM

## 2017-01-27 DIAGNOSIS — R2681 Unsteadiness on feet: Secondary | ICD-10-CM

## 2017-01-27 NOTE — Patient Instructions (Signed)
   Abduction: Clam (Eccentric) - Side-Lying   Lie on right side with knees bent. Lift left knee, keeping feet together. Keep trunk steady.  5 reps per set, 2 sets per day, 5 days per week.   Copyright  VHI. All rights reserved.

## 2017-01-30 NOTE — Therapy (Signed)
[facility] [address] [place], [place], [address] Phone: [phone]   Fax:  [phone]  Physical Therapy Treatment  Patient Details  Name: [name] MRN: [id number] Date of Birth: 02-23-1927 Referring Provider: Alysia Penna MD   Encounter Date: 01/27/2017  PT End of Session - 01/30/17 1034    Visit Number  4    Number of Visits  17    Date for PT Re-Evaluation  03/03/17    Authorization Type  01/02/17-03/03/17    PT Start Time  1148    PT Stop Time  1233    PT Time Calculation (min)  45 min    Equipment Utilized During Treatment  Gait belt    Activity Tolerance  Patient tolerated treatment well    Behavior During Therapy  [facility] for tasks assessed/performed       Past Medical History:  Diagnosis Date  . Acute renal injury ([place]) 07/2016  . Anginal pain ([place]) 2004; 2011  . CAD (coronary artery disease)    status post stenting of the RCA status post CABG. (left internal mammary artery to  LAD, saphenous vein graft to second diagonal, saphenous vein  graft to obtuse marginal 1, saphenous vein graft to posterior  descending).  . Cardiomyopathy     Mildly reduced EF (45%).   . Chronic kidney disease (CKD), stage III (moderate) ([place])   . History of blood transfusion 1960s   "related to anemia"  . HTN (hypertension)   . Myocardial infarction ([place]) 2011  . Pleural effusion 2011   S/P "bypass"  . Shortness of breath   . Stroke ([place]) 09/19/2016   "left sided weakness" (09/20/2016)  . Type II diabetes mellitus ([place])     Past Surgical History:  Procedure Laterality Date  . CATARACT EXTRACTION W/ INTRAOCULAR LENS  IMPLANT, BILATERAL Bilateral   . CORONARY ANGIOPLASTY WITH STENT PLACEMENT  2004   "LAD & one of the circumflex"  . CORONARY ARTERY BYPASS GRAFT  2011   CABG X4    Vitals:   01/27/17 1201 01/27/17 1224  BP: (!) 178/77 (!) 170/73  Pulse: 69 65                     [facility]  Adult PT Treatment/Exercise - 01/30/17 0001      Transfers   Transfers  Sit to Stand;Stand to [employer] Transfers    Sit to Stand  5: Supervision;With upper extremity assist;From bed;From chair/3-in-1    Sit to Stand Details  Verbal cues for sequencing;Verbal cues for precautions/safety;Verbal cues for safe use of DME/AE    Stand to Sit  5: Supervision;With upper extremity assist;To bed;To chair/3-in-1    Stand to Sit Details (indicate cue type and reason)  Verbal cues for sequencing;Verbal cues for technique;Verbal cues for safe use of DME/AE    Stand Pivot Transfers  4: Min guard    Stand Pivot Transfer Details (indicate cue type and reason)  from wheelchair to mat table with cues on hand placement/technique.       Ambulation/Gait   Ambulation/Gait  Yes    Ambulation/Gait Assistance  5: Supervision;4: Min assist;4: Min guard    Ambulation/Gait Assistance Details  vc for proximity to RW, upright posture, longer step length with heelstrike    Ambulation Distance (Feet)  120 Feet x2    Assistive device  Rolling walker    Gait Pattern  Step-through pattern;Decreased stride length;Narrow base of support;Right foot flat;Left foot flat  Ambulation Surface  Indoor    Gait Comments  --      Exercises   Exercises  Knee/Hip    Other Exercises   --      Knee/Hip Exercises: Standing   Heel Raises  Both;1 set;10 reps    Hip Flexion  Stengthening;Both;1 set;20 reps;Knee bent    Hip Abduction  Stengthening;Both;1 set;20 reps;Knee straight    Other Standing Knee Exercises  lateral weight shifting in // bars, facing mirror; focus on upright posture and not allowing trendelenburg      Knee/Hip Exercises: Supine   Bridges  Strengthening;Both;1 set;10 reps 3 second hold      Knee/Hip Exercises: Sidelying   Clams  rt sidelying lifting left leg; 5 reps x 2 sets             PT Education - 01/30/17 1043    Education provided  Yes    Education Details  rationale for hip abduction  strengthening; see additon to HEP    Person(s) Educated  Patient    Methods  Explanation;Demonstration;Handout    Comprehension  Verbalized understanding;Returned demonstration;Verbal cues required;Need further instruction       PT Short Term Goals - 01/02/17 1458      PT SHORT TERM GOAL #1   Title  Patient will be independent with HEP. (Target for all STGs 02/02/2016    Time  4    Period  Weeks    Status  New    Target Date  02/01/17      PT SHORT TERM GOAL #2   Title  Patient will ambulate modified independent with LRAD x 300 ft with velocity averaging >1.5 ft/sec.     Time  4    Period  Weeks    Status  New      PT SHORT TERM GOAL #3   Title  Patient will improve 5x sit to stand time to <=15 seconds (indicative of incr strength, balance, and lesser fall risk)    Time  4    Period  Weeks    Status  New      PT SHORT TERM GOAL #4   Title  Patient will decrease TUG time to <25.0 seconds, demonstrating a reducing fall risk.     Time  4    Period  Weeks    Status  New        PT Long Term Goals - 01/02/17 1504      PT LONG TERM GOAL #1   Title  Patient will be independent with updated HEP. (Target for all STGs 2/01/019)    Time  8    Period  Weeks    Status  New    Target Date  03/03/17      PT LONG TERM GOAL #2   Title  Patient will ambulate modified independent vs independent with LRAD on level indoor and outdoor surfaces (paved walk, paved parking) x 400 ft.     Time  8    Period  Weeks    Status  New      PT LONG TERM GOAL #3   Title  Patient will improve 5x sit to stand time to <=12 sec to demonstrate lesser fall risk    Time  8    Period  Weeks    Status  New      PT LONG TERM GOAL #4   Title  Patient will improve her TUG to <=20 seconds-demonstrating a lesser fall risk.  Time  8    Period  Weeks    Status  New      PT LONG TERM GOAL #5   Title  Patient will ambulate with LRAD with velocity >=1.8 ft/sec    Time  8    Period  Weeks    Status   New            Plan - 01/30/17 1044    Clinical Impression Statement  Session focused on gait training with RW and strengthening. Patient can maintain gait improvements after cuing for up to 40 ft and then requires repeat cues. Patient remains motivaated to improve her safety with mobility.     Rehab Potential  Good    Clinical Impairments Affecting Rehab Potential  motivated, HTN     PT Frequency  2x / week    PT Duration  8 weeks    PT Treatment/Interventions  ADLs/Self Care Home Management;DME Instruction;Gait training;Neuromuscular re-education;Balance training;Therapeutic exercise;Therapeutic activities;Functional mobility training;Stair training;Patient/family education    PT Next Visit Plan  Check BP; check technique for sidelying clam added 12/28; ?add SLS to HEP; gait with RW, balance training    Consulted and Agree with Plan of Care  Patient       Patient will benefit from skilled therapeutic intervention in order to improve the following deficits and impairments:  Abnormal gait, Cardiopulmonary status limiting activity, Decreased balance, Decreased mobility, Decreased knowledge of use of DME, Decreased endurance, Decreased coordination, Decreased strength, Increased edema, Difficulty walking  Visit Diagnosis: Muscle weakness (generalized)  Unsteadiness on feet  Other abnormalities of gait and mobility     Problem List Patient Active Problem List   Diagnosis Date Noted  . Rectal bleed 10/13/2016  . Malnutrition of moderate degree 10/13/2016  . Chest pain with moderate risk for cardiac etiology 10/06/2016  . Labile blood pressure   . Stage 3 chronic kidney disease ([place])   . AKI (acute kidney injury) ([place])   . Acute blood loss anemia   . Small vessel disease 09/22/2016  . Hemiparesis affecting left side as late effect of stroke ([place]) 09/22/2016  . Gait disturbance, post-stroke 09/22/2016  . Stroke (cerebrum) ([place]) 09/20/2016  . Diabetes mellitus with  complication ([place])   . Ischemic stroke ([place])   . Status post intraocular lens implant 06/23/2011  . Glaucoma suspect 12/30/2010  . Macular hole 12/30/2010  . Nonproliferative diabetic retinopathy ([place]) 12/30/2010  . Syncope 06/17/2010  . CARDIOMYOPATHY 04/15/2010  . PLEURAL EFFUSION 04/14/2010  . SHORTNESS OF BREATH 04/14/2010  . CORONARY ATHEROSCLEROSIS NATIVE CORONARY ARTERY 01/19/2009  . Hyperlipidemia 01/06/2009  . Diabetes mellitus ([place]) 01/05/2009  . Essential hypertension 01/05/2009  . Coronary atherosclerosis 01/05/2009    Rexanne Mano, PT 01/30/2017, 10:48 AM  [facility] [address] [place], [place], [address] Phone: [phone]   Fax:  [phone]  Name: [name] MRN: [id number] Date of Birth: 09/30/1927

## 2017-02-01 ENCOUNTER — Ambulatory Visit: Payer: Medicare Other | Attending: Physical Medicine & Rehabilitation | Admitting: Occupational Therapy

## 2017-02-01 ENCOUNTER — Ambulatory Visit: Payer: Medicare Other | Admitting: Physical Therapy

## 2017-02-01 ENCOUNTER — Encounter: Payer: Self-pay | Admitting: Physical Therapy

## 2017-02-01 ENCOUNTER — Encounter: Payer: Self-pay | Admitting: Occupational Therapy

## 2017-02-01 VITALS — BP 158/75 | HR 63

## 2017-02-01 DIAGNOSIS — M6281 Muscle weakness (generalized): Secondary | ICD-10-CM

## 2017-02-01 DIAGNOSIS — R2681 Unsteadiness on feet: Secondary | ICD-10-CM | POA: Diagnosis present

## 2017-02-01 DIAGNOSIS — R2689 Other abnormalities of gait and mobility: Secondary | ICD-10-CM

## 2017-02-01 DIAGNOSIS — I69354 Hemiplegia and hemiparesis following cerebral infarction affecting left non-dominant side: Secondary | ICD-10-CM | POA: Insufficient documentation

## 2017-02-01 NOTE — Patient Instructions (Addendum)
   Lie on back holding wand. Raise arms over head. Hold 5sec. Repeat 10 times per set.  Do 2-3 sessions per day.          Press-Up With Wand   Press wand up until elbows are straight, then reach wand over head to a pain free range. Hold 5 seconds. Repeat 10 times. Do 2-3 sessions per day.                     1. Grip Strengthening (Resistive Putty)   Squeeze putty using thumb and all fingers. Repeat _20___ times. Do __2__ sessions per day.   2. Roll putty into tube on table and pinch between each finger and thumb x 10 reps each. (can do ring and small finger together)     Copyright  VHI. All rights reserved.

## 2017-02-01 NOTE — Therapy (Signed)
[facility] [address] [place], [place], [address] Phone: [phone]   Fax:  [phone]  Occupational Therapy Treatment  Patient Details  Name: [name] MRN: [id number] Date of Birth: Dec 09, 1927 Referring Provider (Historical): Dr. Alysia Penna   Encounter Date: 02/01/2017  OT End of Session - 02/01/17 1558    Visit Number  3    Number of Visits  17    Date for OT Re-Evaluation  03/03/17    Authorization Type  [employer] ?Medicare, no auth/no visit limit    Authorization - Visit Number  3    Authorization - Number of Visits  10    OT Start Time  3235    OT Stop Time  1615    OT Time Calculation (min)  40 min    Activity Tolerance  Patient tolerated treatment well    Behavior During Therapy  WFL for tasks assessed/performed       Past Medical History:  Diagnosis Date  . Acute renal injury ([place]) 07/2016  . Anginal pain ([place]) 2004; 2011  . CAD (coronary artery disease)    status post stenting of the RCA status post CABG. (left internal mammary artery to  LAD, saphenous vein graft to second diagonal, saphenous vein  graft to obtuse marginal 1, saphenous vein graft to posterior  descending).  . Cardiomyopathy     Mildly reduced EF (45%).   . Chronic kidney disease (CKD), stage III (moderate) ([place])   . History of blood transfusion 1960s   "related to anemia"  . HTN (hypertension)   . Myocardial infarction ([place]) 2011  . Pleural effusion 2011   S/P "bypass"  . Shortness of breath   . Stroke ([place]) 09/19/2016   "left sided weakness" (09/20/2016)  . Type II diabetes mellitus ([place])     Past Surgical History:  Procedure Laterality Date  . CATARACT EXTRACTION W/ INTRAOCULAR LENS  IMPLANT, BILATERAL Bilateral   . CORONARY ANGIOPLASTY WITH STENT PLACEMENT  2004   "LAD & one of the circumflex"  . CORONARY ARTERY BYPASS GRAFT  2011   CABG X4    There were no vitals filed for this visit.  Subjective Assessment -  02/01/17 1537    Subjective   Denies pain    Pertinent History  CVA 09/20/16; CAD with CABG 2011, diabetes mellitus, CKD stage 3, HTN, hyperlipidemia, macular hole    Patient Stated Goals  walk without device, strengthen LUE    Currently in Pain?  No/denies             Treatment: fine motor coordination tasks with LUE, stacking coins, flipping and dealing cards, min-mod difficulty, min v.c to avoid compensation. Did not issue as HEP due to time constraints.              OT Education - 02/01/17 1601    Education provided  Yes    Education Details  supine cane exercises, chest press, shoulder flexion, red putty HEP    Person(s) Educated  Patient    Methods  Explanation;Demonstration;Verbal cues    Comprehension  Verbalized understanding;Returned demonstration;Verbal cues required       OT Short Term Goals - 01/02/17 1311      OT SHORT TERM GOAL #1   Title  Pt will be independent with initial HEP.--check STGs 02/01/16    Time  4    Period  Weeks    Status  New      OT SHORT TERM GOAL #2  Title  Pt will perform simple home maintenance tasks in standing mod I.    Time  4    Period  Weeks    Status  New      OT SHORT TERM GOAL #3   Title  Pt will improve LUE strength as shown by ability to retrieve 2lb object from overhead shelf with LUE safely x5.    Time  4    Period  Weeks    Status  New        OT Long Term Goals - 01/02/17 1312      OT LONG TERM GOAL #1   Title  Pt will be independent with updated HEP.--check LTGs 03/03/16    Time  8    Period  Weeks    Status  New      OT LONG TERM GOAL #2   Title  Pt will perform simple cooking tasks mod I in standing without device.    Time  8    Period  Weeks    Status  New      OT LONG TERM GOAL #3   Title  Pt will improve L grip strength by at least 4lbs to assist in opening containers/lifting objects.    Baseline  25lbs    Time  8    Period  Weeks    Status  New      OT LONG TERM GOAL #4   Title  Pt  will improve LUE strength and balance as shown by ability to retrieve at least 5lb object from overhead shelf with BUEs.    Time  8    Period  Weeks    Status  New            Plan - 02/01/17 1602    Clinical Impression Statement  Pt is progressing towards goals, she demonstrates improving LUE functional use.    Occupational Profile and client history currently impacting functional performance  Pt is a 84 y.o. female s/p CVA with L hemiparesis (09/20/16).  Pt with PMH that includes:  CAD with CABG 2011, diabetes mellitus, CKD stage 3, HTN, hyperlipidemia, macular hole.  Pt was independent with mobility, ADLs/IADLs prior to CVA, but is now limited with IADLs and functional mobility.     Rehab Potential  Good    OT Frequency  2x / week    OT Duration  8 weeks    OT Treatment/Interventions  Self-care/ADL training;Moist Heat;DME and/or AE instruction;Therapeutic activities;Therapeutic exercise;Functional Mobility Training;Neuromuscular education;Energy conservation;Manual Therapy;Passive range of motion;Patient/family education;Cryotherapy;Balance training    Plan  check on HEP, consider coordination HEP, NMR     Consulted and Agree with Plan of Care  Patient       Patient will benefit from skilled therapeutic intervention in order to improve the following deficits and impairments:  Decreased knowledge of use of DME, Decreased mobility, Decreased coordination, Decreased activity tolerance, Decreased endurance, Decreased strength, Impaired UE functional use, Decreased balance  Visit Diagnosis: Muscle weakness (generalized)  Hemiplegia and hemiparesis following cerebral infarction affecting left non-dominant side (HCC)  Unsteadiness on feet    Problem List Patient Active Problem List   Diagnosis Date Noted  . Rectal bleed 10/13/2016  . Malnutrition of moderate degree 10/13/2016  . Chest pain with moderate risk for cardiac etiology 10/06/2016  . Labile blood pressure   . Stage 3  chronic kidney disease ([place])   . AKI (acute kidney injury) ([place])   . Acute blood loss anemia   .  Small vessel disease 09/22/2016  . Hemiparesis affecting left side as late effect of stroke ([place]) 09/22/2016  . Gait disturbance, post-stroke 09/22/2016  . Stroke (cerebrum) ([place]) 09/20/2016  . Diabetes mellitus with complication ([place])   . Ischemic stroke ([place])   . Status post intraocular lens implant 06/23/2011  . Glaucoma suspect 12/30/2010  . Macular hole 12/30/2010  . Nonproliferative diabetic retinopathy ([place]) 12/30/2010  . Syncope 06/17/2010  . CARDIOMYOPATHY 04/15/2010  . PLEURAL EFFUSION 04/14/2010  . SHORTNESS OF BREATH 04/14/2010  . CORONARY ATHEROSCLEROSIS NATIVE CORONARY ARTERY 01/19/2009  . Hyperlipidemia 01/06/2009  . Diabetes mellitus ([place]) 01/05/2009  . Essential hypertension 01/05/2009  . Coronary atherosclerosis 01/05/2009    Orrie Lascano 02/01/2017, 4:24 PM  [facility] [address] [place], [place], [address] Phone: [phone]   Fax:  [phone]  Name: [name] MRN: [id number] Date of Birth: 06-10-1927

## 2017-02-02 NOTE — Therapy (Signed)
[facility] [address] [place], [place], [address] Phone: [phone]   Fax:  [phone]  Physical Therapy Treatment  Patient Details  Name: [name] MRN: [id number] Date of Birth: [birth year] Referring Provider: Alysia Penna MD   Encounter Date: 02/01/2017  PT End of Session - 02/01/17 1453    Visit Number  6 updated due to mis-numbering last 2 sessions    Number of Visits  17    Date for PT Re-Evaluation  03/03/17    Authorization Type  01/02/17-03/03/17    PT Start Time  1450    PT Stop Time  1530    PT Time Calculation (min)  40 min    Equipment Utilized During Treatment  Gait belt    Activity Tolerance  Patient tolerated treatment well    Behavior During Therapy  [facility] for tasks assessed/performed       Past Medical History:  Diagnosis Date  . Acute renal injury ([place]) 07/2016  . Anginal pain ([place]) 2004; 2011  . CAD (coronary artery disease)    status post stenting of the RCA status post CABG. (left internal mammary artery to  LAD, saphenous vein graft to second diagonal, saphenous vein  graft to obtuse marginal 1, saphenous vein graft to posterior  descending).  . Cardiomyopathy     Mildly reduced EF (45%).   . Chronic kidney disease (CKD), stage III (moderate) ([place])   . History of blood transfusion 1960s   "related to anemia"  . HTN (hypertension)   . Myocardial infarction ([place]) 2011  . Pleural effusion 2011   S/P "bypass"  . Shortness of breath   . Stroke ([place]) 09/19/2016   "left sided weakness" (09/20/2016)  . Type II diabetes mellitus ([place])     Past Surgical History:  Procedure Laterality Date  . CATARACT EXTRACTION W/ INTRAOCULAR LENS  IMPLANT, BILATERAL Bilateral   . CORONARY ANGIOPLASTY WITH STENT PLACEMENT  2004   "LAD & one of the circumflex"  . CORONARY ARTERY BYPASS GRAFT  2011   CABG X4    Vitals:   02/01/17 1454 02/01/17 1522 02/01/17 1529  BP: (!) 178/85 (!) 193/77 (!)  158/75  Pulse: 65 66 63    Subjective Assessment - 02/01/17 1452    Subjective  No new complaints. No falls or pain to report. To therapy with her RW today.     Pertinent History  CAD, CABG, DM, CKD, cardiomyopathy, HTN    Patient Stated Goals  Be able to walk with a SPC or no device at all    Currently in Pain?  No/denies         [facility] PT Assessment - 02/01/17 1458      Transfers   Transfers  Sit to Stand;Stand to Sit    Sit to Stand  5: Supervision;With upper extremity assist;From bed;From chair/3-in-1    Five time sit to stand comments   16.59 sec's with UE support from standard height chair    Stand to Sit  5: Supervision;With upper extremity assist;To bed;To chair/3-in-1      Ambulation/Gait   Ambulation/Gait  Yes    Ambulation/Gait Assistance  5: Supervision;4: Min guard    Ambulation Distance (Feet)  215 Feet x 2    Assistive device  Rolling walker    Gait Pattern  Step-through pattern;Decreased stride length;Narrow base of support;Right foot flat;Left foot flat    Ambulation Surface  Level;Indoor    Gait velocity  20.13 1.63 ft/sec with RW  Standardized Balance Assessment   Standardized Balance Assessment  Timed Up and Go Test      Timed Up and Go Test   Normal TUG (seconds)  26.4 with RW          [facility] Adult PT Treatment/Exercise - 02/01/17 1458      Ambulation/Gait   Ambulation/Gait Assistance Details  cues for heel to toe step progression and for increased step length with gait. pt able to demo good walker safety/proximity with gait      Exercises   Other Exercises   reviewed with pt performing all ex's issued at last session with cues on correct technique/ex form.          PT Short Term Goals - 02/01/17 1457      PT SHORT TERM GOAL #1   Title  Patient will be independent with HEP. (Target for all STGs 02/02/2016    Baseline  02/01/17: met today    Time  --    Period  --    Status  Achieved      PT SHORT TERM GOAL #2   Title  Patient will  ambulate modified independent with LRAD x 300 ft with velocity averaging >1.5 ft/sec.     Baseline  02/01/17: pt continues to need supervision with RW with cues for safety with max distance of 215 feet, improved from eval just not to goal    Time  --    Period  --    Status  Partially Met      PT SHORT TERM GOAL #3   Title  Patient will improve 5x sit to stand time to <=15 seconds (indicative of incr strength, balance, and lesser fall risk)    Baseline  02/01/17: 16.59 sec's with UE assist using standard height chair, improved just not to goal    Time  --    Period  --    Status  Partially Met      PT SHORT TERM GOAL #4   Title  Patient will decrease TUG time to <25.0 seconds, demonstrating a reducing fall risk.     Baseline  02/01/17: 26.4 sec's with RW, improved just not to goal    Time  --    Period  --    Status  Partially Met        PT Long Term Goals - 01/02/17 1504      PT LONG TERM GOAL #1   Title  Patient will be independent with updated HEP. (Target for all STGs 2/01/019)    Time  8    Period  Weeks    Status  New    Target Date  03/03/17      PT LONG TERM GOAL #2   Title  Patient will ambulate modified independent vs independent with LRAD on level indoor and outdoor surfaces (paved walk, paved parking) x 400 ft.     Time  8    Period  Weeks    Status  New      PT LONG TERM GOAL #3   Title  Patient will improve 5x sit to stand time to <=12 sec to demonstrate lesser fall risk    Time  8    Period  Weeks    Status  New      PT LONG TERM GOAL #4   Title  Patient will improve her TUG to <=20 seconds-demonstrating a lesser fall risk.     Time  8    Period  Weeks    Status  New      PT LONG TERM GOAL #5   Title  Patient will ambulate with LRAD with velocity >=1.8 ft/sec    Time  8    Period  Weeks    Status  New          Plan - 02/01/17 1453    Clinical Impression Statement  Today's skilled session focused on checking progress toward STGs with 1 goal met  and other 3 partially met. Pt is progressing and should benefit from continued PT to progress toward unmet goals.     Rehab Potential  Good    Clinical Impairments Affecting Rehab Potential  motivated, HTN     PT Frequency  2x / week    PT Duration  8 weeks    PT Treatment/Interventions  ADLs/Self Care Home Management;DME Instruction;Gait training;Neuromuscular re-education;Balance training;Therapeutic exercise;Therapeutic activities;Functional mobility training;Stair training;Patient/family education    PT Next Visit Plan  Check BP;  gait with RW, balance training    Consulted and Agree with Plan of Care  Patient       Patient will benefit from skilled therapeutic intervention in order to improve the following deficits and impairments:  Abnormal gait, Cardiopulmonary status limiting activity, Decreased balance, Decreased mobility, Decreased knowledge of use of DME, Decreased endurance, Decreased coordination, Decreased strength, Increased edema, Difficulty walking  Visit Diagnosis: Muscle weakness (generalized)  Unsteadiness on feet  Other abnormalities of gait and mobility     Problem List Patient Active Problem List   Diagnosis Date Noted  . Rectal bleed 10/13/2016  . Malnutrition of moderate degree 10/13/2016  . Chest pain with moderate risk for cardiac etiology 10/06/2016  . Labile blood pressure   . Stage 3 chronic kidney disease ([place])   . AKI (acute kidney injury) ([place])   . Acute blood loss anemia   . Small vessel disease 09/22/2016  . Hemiparesis affecting left side as late effect of stroke ([place]) 09/22/2016  . Gait disturbance, post-stroke 09/22/2016  . Stroke (cerebrum) ([place]) 09/20/2016  . Diabetes mellitus with complication ([place])   . Ischemic stroke ([place])   . Status post intraocular lens implant 06/23/2011  . Glaucoma suspect 12/30/2010  . Macular hole 12/30/2010  . Nonproliferative diabetic retinopathy ([place]) 12/30/2010  . Syncope 06/17/2010  . CARDIOMYOPATHY  04/15/2010  . PLEURAL EFFUSION 04/14/2010  . SHORTNESS OF BREATH 04/14/2010  . CORONARY ATHEROSCLEROSIS NATIVE CORONARY ARTERY 01/19/2009  . Hyperlipidemia 01/06/2009  . Diabetes mellitus ([place]) 01/05/2009  . Essential hypertension 01/05/2009  . Coronary atherosclerosis 01/05/2009    Willow Ora, PTA, [facility] [address], [place], [place] [address] [phone] 02/02/17, 10:55 AM   Name: [name] MRN: [id number] Date of Birth: 08/02/1927

## 2017-02-03 ENCOUNTER — Ambulatory Visit: Payer: Medicare Other | Admitting: Physical Therapy

## 2017-02-03 ENCOUNTER — Encounter: Payer: Self-pay | Admitting: Occupational Therapy

## 2017-02-03 ENCOUNTER — Encounter: Payer: Self-pay | Admitting: Physical Therapy

## 2017-02-03 ENCOUNTER — Ambulatory Visit: Payer: Medicare Other | Admitting: Occupational Therapy

## 2017-02-03 VITALS — BP 110/70

## 2017-02-03 DIAGNOSIS — R2689 Other abnormalities of gait and mobility: Secondary | ICD-10-CM

## 2017-02-03 DIAGNOSIS — R2681 Unsteadiness on feet: Secondary | ICD-10-CM

## 2017-02-03 DIAGNOSIS — I69354 Hemiplegia and hemiparesis following cerebral infarction affecting left non-dominant side: Secondary | ICD-10-CM

## 2017-02-03 DIAGNOSIS — M6281 Muscle weakness (generalized): Secondary | ICD-10-CM

## 2017-02-03 NOTE — Therapy (Signed)
[facility] [address] [place], [place], [address] Phone: [phone]   Fax:  [phone]  Occupational Therapy Treatment  Patient Details  Name: [name] MRN: [id number] Date of Birth: 1927-04-09 Referring Provider (Historical): Dr. Alysia Penna   Encounter Date: 02/03/2017  OT End of Session - 02/03/17 1535    Visit Number  4    Number of Visits  17    Date for OT Re-Evaluation  03/03/17    Authorization Type  [employer] ?Medicare, no auth/no visit limit    Authorization - Visit Number  --    Authorization - Number of Visits  --    OT Start Time  7494    OT Stop Time  1620    OT Time Calculation (min)  42 min    Activity Tolerance  Patient tolerated treatment well    Behavior During Therapy  WFL for tasks assessed/performed       Past Medical History:  Diagnosis Date  . Acute renal injury ([place]) 07/2016  . Anginal pain ([place]) 2004; 2011  . CAD (coronary artery disease)    status post stenting of the RCA status post CABG. (left internal mammary artery to  LAD, saphenous vein graft to second diagonal, saphenous vein  graft to obtuse marginal 1, saphenous vein graft to posterior  descending).  . Cardiomyopathy     Mildly reduced EF (45%).   . Chronic kidney disease (CKD), stage III (moderate) ([place])   . History of blood transfusion 1960s   "related to anemia"  . HTN (hypertension)   . Myocardial infarction ([place]) 2011  . Pleural effusion 2011   S/P "bypass"  . Shortness of breath   . Stroke ([place]) 09/19/2016   "left sided weakness" (09/20/2016)  . Type II diabetes mellitus ([place])     Past Surgical History:  Procedure Laterality Date  . CATARACT EXTRACTION W/ INTRAOCULAR LENS  IMPLANT, BILATERAL Bilateral   . CORONARY ANGIOPLASTY WITH STENT PLACEMENT  2004   "LAD & one of the circumflex"  . CORONARY ARTERY BYPASS GRAFT  2011   CABG X4    There were no vitals filed for this visit.  Subjective Assessment  - 02/03/17 1535    Subjective   The pennies were the hardest    Pertinent History  CVA 09/20/16; CAD with CABG 2011, diabetes mellitus, CKD stage 3, HTN, hyperlipidemia, macular hole    Patient Stated Goals  walk without device, strengthen LUE    Currently in Pain?  No/denies         In standing, placing clothespins with 1-8lb resist on vertical pole for incr activity tolerance/strength.  Pt with min difficulty with reaching and needed 1 rest break (sitting) to complete all clothespins.  In supine, reviewed cane exercises (from HEP) for chest press and shoulder flex x10 each.  Followed by chest press and shoulder flex to 90* with min facilitation holding 2lb wt in both hands.                  OT Education - 02/03/17 1554    Education Details  Coordination HEP    Person(s) Educated  Patient    Methods  Explanation;Demonstration;Handout;Verbal cues    Comprehension  Verbalized understanding;Returned demonstration;Verbal cues required       OT Short Term Goals - 01/02/17 1311      OT SHORT TERM GOAL #1   Title  Pt will be independent with initial HEP.--check STGs 02/01/16    Time  4    Period  Weeks    Status  New      OT SHORT TERM GOAL #2   Title  Pt will perform simple home maintenance tasks in standing mod I.    Time  4    Period  Weeks    Status  New      OT SHORT TERM GOAL #3   Title  Pt will improve LUE strength as shown by ability to retrieve 2lb object from overhead shelf with LUE safely x5.    Time  4    Period  Weeks    Status  New        OT Long Term Goals - 01/02/17 1312      OT LONG TERM GOAL #1   Title  Pt will be independent with updated HEP.--check LTGs 03/03/16    Time  8    Period  Weeks    Status  New      OT LONG TERM GOAL #2   Title  Pt will perform simple cooking tasks mod I in standing without device.    Time  8    Period  Weeks    Status  New      OT LONG TERM GOAL #3   Title  Pt will improve L grip strength by at least 4lbs  to assist in opening containers/lifting objects.    Baseline  25lbs    Time  8    Period  Weeks    Status  New      OT LONG TERM GOAL #4   Title  Pt will improve LUE strength and balance as shown by ability to retrieve at least 5lb object from overhead shelf with BUEs.    Time  8    Period  Weeks    Status  New            Plan - 02/03/17 1543    Clinical Impression Statement  Pt is progressing towards goals with improving LUE functional use and activity tolerance.    Occupational Profile and client history currently impacting functional performance  Pt is a 84 y.o. female s/p CVA with L hemiparesis (09/20/16).  Pt with PMH that includes:  CAD with CABG 2011, diabetes mellitus, CKD stage 3, HTN, hyperlipidemia, macular hole.  Pt was independent with mobility, ADLs/IADLs prior to CVA, but is now limited with IADLs and functional mobility.     Rehab Potential  Good    OT Frequency  2x / week    OT Duration  8 weeks    OT Treatment/Interventions  Self-care/ADL training;Moist Heat;DME and/or AE instruction;Therapeutic activities;Therapeutic exercise;Functional Mobility Training;Neuromuscular education;Energy conservation;Manual Therapy;Passive range of motion;Patient/family education;Cryotherapy;Balance training    Plan  standing functional reaching, simple IADL, LUE gentle strengthening/activity tolerance    OT Home Exercise Plan  Education provided:  Cane ex for shoulder flex, chest press; basic coordination HEP    Consulted and Agree with Plan of Care  Patient       Patient will benefit from skilled therapeutic intervention in order to improve the following deficits and impairments:  Decreased knowledge of use of DME, Decreased mobility, Decreased coordination, Decreased activity tolerance, Decreased endurance, Decreased strength, Impaired UE functional use, Decreased balance  Visit Diagnosis: Unsteadiness on feet  Muscle weakness (generalized)  Hemiplegia and hemiparesis  following cerebral infarction affecting left non-dominant side (HCC)  Other abnormalities of gait and mobility    Problem List Patient Active Problem List   Diagnosis Date  Noted  . Rectal bleed 10/13/2016  . Malnutrition of moderate degree 10/13/2016  . Chest pain with moderate risk for cardiac etiology 10/06/2016  . Labile blood pressure   . Stage 3 chronic kidney disease ([place])   . AKI (acute kidney injury) ([place])   . Acute blood loss anemia   . Small vessel disease 09/22/2016  . Hemiparesis affecting left side as late effect of stroke ([place]) 09/22/2016  . Gait disturbance, post-stroke 09/22/2016  . Stroke (cerebrum) ([place]) 09/20/2016  . Diabetes mellitus with complication ([place])   . Ischemic stroke ([place])   . Status post intraocular lens implant 06/23/2011  . Glaucoma suspect 12/30/2010  . Macular hole 12/30/2010  . Nonproliferative diabetic retinopathy ([place]) 12/30/2010  . Syncope 06/17/2010  . CARDIOMYOPATHY 04/15/2010  . PLEURAL EFFUSION 04/14/2010  . SHORTNESS OF BREATH 04/14/2010  . CORONARY ATHEROSCLEROSIS NATIVE CORONARY ARTERY 01/19/2009  . Hyperlipidemia 01/06/2009  . Diabetes mellitus ([place]) 01/05/2009  . Essential hypertension 01/05/2009  . Coronary atherosclerosis 01/05/2009    [facility] 02/03/2017, 5:35 PM  [facility] [address] [place], [place], [address] Phone: [phone]   Fax:  [phone]  Name: Titilayo Hagans MRN: [id number] Date of Birth: 05-21-1927   Vianne Bulls, OTR/L [facility] [address] [place], [place]  [address] [phone] phone [phone] 02/03/17 5:35 PM

## 2017-02-03 NOTE — Patient Instructions (Addendum)
  Coordination Activities  Perform the following activities for 15-20 minutes every other day with left hand(s).  Keep shoulder down.   Rotate ball in fingertips (clockwise and counter-clockwise).  Flip cards 1 at a time.  Deal cards with your thumb (Hold deck in hand and push card off top with thumb).  Pick up coins and stack.  Can drag to the edge.  Pick up coins without dragging to the edge.  Pick up coins one at a time until you get 5-10 in your hand, then move coins from palm to fingertips to place in container/coin bank 1 at a time.

## 2017-02-03 NOTE — Patient Instructions (Signed)
ANKLE: Dorsiflexion, Alternate - Sitting    Sit at edge of sitting surface, feet on floor. Put yellow band around the bottom of your left foot. Lay the loose ends of the band on the floor near your right foot. Step on the loose ends with the right foot and slide your left foot out to put tension on the band. Then raise front of left foot keeping heel down.  _20__ reps per set, __2_ sets per day, _5__ days per week   Copyright  VHI. All rights reserved.

## 2017-02-03 NOTE — Therapy (Signed)
[facility] [address] [place], [place], [address] Phone: [phone]   Fax:  [phone]  Physical Therapy Treatment  Patient Details  Name: [name] MRN: [id number] Date of Birth: 29-Oct-1927 Referring Provider: Alysia Penna MD   Encounter Date: 02/03/2017  PT End of Session - 02/03/17 1800    Visit Number  6 corrected to 6th visit (prior count double counted visit 4)    Number of Visits  17    Date for PT Re-Evaluation  03/03/17    Authorization Type  01/02/17-03/03/17    PT Start Time  1453    PT Stop Time  1531    PT Time Calculation (min)  38 min    Equipment Utilized During Treatment  Gait belt    Activity Tolerance  Patient tolerated treatment well    Behavior During Therapy  [facility] for tasks assessed/performed       Past Medical History:  Diagnosis Date  . Acute renal injury ([place]) 07/2016  . Anginal pain ([place]) 2004; 2011  . CAD (coronary artery disease)    status post stenting of the RCA status post CABG. (left internal mammary artery to  LAD, saphenous vein graft to second diagonal, saphenous vein  graft to obtuse marginal 1, saphenous vein graft to posterior  descending).  . Cardiomyopathy     Mildly reduced EF (45%).   . Chronic kidney disease (CKD), stage III (moderate) ([place])   . History of blood transfusion 1960s   "related to anemia"  . HTN (hypertension)   . Myocardial infarction ([place]) 2011  . Pleural effusion 2011   S/P "bypass"  . Shortness of breath   . Stroke ([place]) 09/19/2016   "left sided weakness" (09/20/2016)  . Type II diabetes mellitus ([place])     Past Surgical History:  Procedure Laterality Date  . CATARACT EXTRACTION W/ INTRAOCULAR LENS  IMPLANT, BILATERAL Bilateral   . CORONARY ANGIOPLASTY WITH STENT PLACEMENT  2004   "LAD & one of the circumflex"  . CORONARY ARTERY BYPASS GRAFT  2011   CABG X4    Vitals:   02/03/17 1457  BP: 110/70    Subjective Assessment -  02/03/17 1456    Subjective  No new complaints. No falls or pain to report. To therapy with her RW today.     Pertinent History  CAD, CABG, DM, CKD, cardiomyopathy, HTN    Patient Stated Goals  Be able to walk with a SPC or no device at all    Currently in Pain?  No/denies                      [facility] Adult PT Treatment/Exercise - 02/03/17 1513      Transfers   Transfers  Sit to Stand;Stand to Sit    Sit to Stand  5: Supervision    Stand to Sit  5: Supervision      Ambulation/Gait   Ambulation/Gait  Yes    Ambulation/Gait Assistance  5: Supervision    Ambulation/Gait Assistance Details  vc for lt heelstrike, upright posture    Ambulation Distance (Feet)  100 Feet 75, 75    Assistive device  Rolling walker    Gait Pattern  Step-through pattern;Decreased stride length;Left foot flat;Decreased dorsiflexion - left;Poor foot clearance - left    Ambulation Surface  Indoor    Pre-Gait Activities  at counter, then with RW--left foot forward, heel strike to foot flat and wt-shift anteriorly, then reverse to weight  shift onto back/right foot x 10 x 2 sets; 1 set with RLE in front      Exercises   Exercises  Ankle      Ankle Exercises: Seated   Toe Raise  20 reps with yellow band; 2 sets          Balance Exercises - 02/03/17 1756      Balance Exercises: Standing   SLS  Eyes open;Upper extremity support 2;5 reps alternating legs, counter vs RW; focus on keeping hips level    Sidestepping  Upper extremity support;4 reps        PT Education - 02/03/17 1759    Education provided  Yes    Education Details  see addition to HEP    Person(s) Educated  Patient    Methods  Explanation;Demonstration;Handout    Comprehension  Returned demonstration;Verbalized understanding       PT Short Term Goals - 02/01/17 1457      PT SHORT TERM GOAL #1   Title  Patient will be independent with HEP. (Target for all STGs 02/02/2016    Baseline  02/01/17: met today    Time  --     Period  --    Status  Achieved      PT SHORT TERM GOAL #2   Title  Patient will ambulate modified independent with LRAD x 300 ft with velocity averaging >1.5 ft/sec.     Baseline  02/01/17: pt continues to need supervision with RW with cues for safety with max distance of 215 feet, improved from eval just not to goal    Time  --    Period  --    Status  Partially Met      PT SHORT TERM GOAL #3   Title  Patient will improve 5x sit to stand time to <=15 seconds (indicative of incr strength, balance, and lesser fall risk)    Baseline  02/01/17: 16.59 sec's with UE assist using standard height chair, improved just not to goal    Time  --    Period  --    Status  Partially Met      PT SHORT TERM GOAL #4   Title  Patient will decrease TUG time to <25.0 seconds, demonstrating a reducing fall risk.     Baseline  02/01/17: 26.4 sec's with RW, improved just not to goal    Time  --    Period  --    Status  Partially Met        PT Long Term Goals - 01/02/17 1504      PT LONG TERM GOAL #1   Title  Patient will be independent with updated HEP. (Target for all STGs 2/01/019)    Time  8    Period  Weeks    Status  New    Target Date  03/03/17      PT LONG TERM GOAL #2   Title  Patient will ambulate modified independent vs independent with LRAD on level indoor and outdoor surfaces (paved walk, paved parking) x 400 ft.     Time  8    Period  Weeks    Status  New      PT LONG TERM GOAL #3   Title  Patient will improve 5x sit to stand time to <=12 sec to demonstrate lesser fall risk    Time  8    Period  Weeks    Status  New      PT  LONG TERM GOAL #4   Title  Patient will improve her TUG to <=20 seconds-demonstrating a lesser fall risk.     Time  8    Period  Weeks    Status  New      PT LONG TERM GOAL #5   Title  Patient will ambulate with LRAD with velocity >=1.8 ft/sec    Time  8    Period  Weeks    Status  New            Plan - 02/03/17 1802    Clinical Impression  Statement  Session focused on pre-gait and gait training with LE strengthening to improve gait dynamics. Patient continues with poor Lt foot clearance with incr risk to trip/fall. Can continue to benefit from PT to work towards [place].     Rehab Potential  Good    Clinical Impairments Affecting Rehab Potential  motivated, HTN     PT Frequency  2x / week    PT Duration  8 weeks    PT Treatment/Interventions  ADLs/Self Care Home Management;DME Instruction;Gait training;Neuromuscular re-education;Balance training;Therapeutic exercise;Therapeutic activities;Functional mobility training;Stair training;Patient/family education    PT Next Visit Plan  Check BP;  pre-gait and gait with RW, balance and strength training    Consulted and Agree with Plan of Care  Patient       Patient will benefit from skilled therapeutic intervention in order to improve the following deficits and impairments:  Abnormal gait, Cardiopulmonary status limiting activity, Decreased balance, Decreased mobility, Decreased knowledge of use of DME, Decreased endurance, Decreased coordination, Decreased strength, Increased edema, Difficulty walking  Visit Diagnosis: Unsteadiness on feet  Muscle weakness (generalized)  Other abnormalities of gait and mobility     Problem List Patient Active Problem List   Diagnosis Date Noted  . Rectal bleed 10/13/2016  . Malnutrition of moderate degree 10/13/2016  . Chest pain with moderate risk for cardiac etiology 10/06/2016  . Labile blood pressure   . Stage 3 chronic kidney disease ([place])   . AKI (acute kidney injury) ([place])   . Acute blood loss anemia   . Small vessel disease 09/22/2016  . Hemiparesis affecting left side as late effect of stroke ([place]) 09/22/2016  . Gait disturbance, post-stroke 09/22/2016  . Stroke (cerebrum) ([place]) 09/20/2016  . Diabetes mellitus with complication ([place])   . Ischemic stroke ([place])   . Status post intraocular lens implant 06/23/2011  . Glaucoma  suspect 12/30/2010  . Macular hole 12/30/2010  . Nonproliferative diabetic retinopathy ([place]) 12/30/2010  . Syncope 06/17/2010  . CARDIOMYOPATHY 04/15/2010  . PLEURAL EFFUSION 04/14/2010  . SHORTNESS OF BREATH 04/14/2010  . CORONARY ATHEROSCLEROSIS NATIVE CORONARY ARTERY 01/19/2009  . Hyperlipidemia 01/06/2009  . Diabetes mellitus ([place]) 01/05/2009  . Essential hypertension 01/05/2009  . Coronary atherosclerosis 01/05/2009    Rexanne Mano, PT 02/03/2017, 6:05 PM  [facility] [address] [place], [place], [address] Phone: [phone]   Fax:  [phone]  Name: [name] MRN: [id number] Date of Birth: 04-26-1927

## 2017-02-06 ENCOUNTER — Ambulatory Visit: Payer: Medicare Other | Admitting: Occupational Therapy

## 2017-02-06 ENCOUNTER — Ambulatory Visit: Payer: Medicare Other | Admitting: Physical Therapy

## 2017-02-07 ENCOUNTER — Encounter: Payer: Self-pay | Admitting: Physical Medicine & Rehabilitation

## 2017-02-07 ENCOUNTER — Other Ambulatory Visit: Payer: Self-pay

## 2017-02-07 ENCOUNTER — Encounter: Payer: Medicare Other | Attending: Physical Medicine & Rehabilitation

## 2017-02-07 ENCOUNTER — Ambulatory Visit: Payer: Medicare Other | Admitting: Physical Medicine & Rehabilitation

## 2017-02-07 VITALS — BP 159/74 | HR 56

## 2017-02-07 DIAGNOSIS — N183 Chronic kidney disease, stage 3 (moderate): Secondary | ICD-10-CM | POA: Diagnosis not present

## 2017-02-07 DIAGNOSIS — I2510 Atherosclerotic heart disease of native coronary artery without angina pectoris: Secondary | ICD-10-CM | POA: Insufficient documentation

## 2017-02-07 DIAGNOSIS — I69354 Hemiplegia and hemiparesis following cerebral infarction affecting left non-dominant side: Secondary | ICD-10-CM

## 2017-02-07 DIAGNOSIS — I129 Hypertensive chronic kidney disease with stage 1 through stage 4 chronic kidney disease, or unspecified chronic kidney disease: Secondary | ICD-10-CM | POA: Insufficient documentation

## 2017-02-07 DIAGNOSIS — R269 Unspecified abnormalities of gait and mobility: Secondary | ICD-10-CM

## 2017-02-07 DIAGNOSIS — E1122 Type 2 diabetes mellitus with diabetic chronic kidney disease: Secondary | ICD-10-CM | POA: Diagnosis not present

## 2017-02-07 DIAGNOSIS — I69398 Other sequelae of cerebral infarction: Secondary | ICD-10-CM | POA: Insufficient documentation

## 2017-02-07 DIAGNOSIS — I252 Old myocardial infarction: Secondary | ICD-10-CM | POA: Insufficient documentation

## 2017-02-07 DIAGNOSIS — Z951 Presence of aortocoronary bypass graft: Secondary | ICD-10-CM | POA: Diagnosis not present

## 2017-02-07 NOTE — Progress Notes (Signed)
Subjective:    [name], female    DOB: 11-Jun-1927, 84 y.o.   MRN: [id number] 84 year old right-handed female with history of CAD with CABG, diabetes mellitus, CKD stage 3. Lives with her daughter, independent prior to admission. Presented September 20, 2016, with left-sided weakness. Cranial CT scan negative. The patient did not receive tPA. MRI of the brain showed 11 mm acute early subacute infarction involving the right posterior limb of internal capsule and lateral thalamus. MRA with no large vessel occlusion or aneurysm. Echocardiogram with ejection fraction of 70%, grade 1 diastolic dysfunction. Maintained on Plavix for CVA prophylaxis.  The patient had chest pain the day that she left rehabilitation and was admitted to the cardiology service and ruled out for myocardial infarction. She had blood pressure medications adjusted  HPI  Left thigh pain woke up with it on Sat am.  Had back to back PT/OT, was practicing transfers from low surface and walking.  No falls or trauma.  Felt better after a shower.  Pain is subsiding No numbness or tingling  Pain Inventory Average Pain 4 Pain Right Now 3 My pain is sharp  In the last 24 hours, has pain interfered with the following? General activity 3 Relation with others 3 Enjoyment of life 10 What TIME of day is your pain at its worst? morning Sleep (in general) Good  Pain is worse with: walking Pain improves with: . Relief from Meds: 0  Mobility ability to climb steps?  yes do you drive?  no use a wheelchair Do you have any goals in this area?  no  Function retired  Neuro/Psych No problems in this area  Prior Studies Any changes since last visit?  no  Physicians involved in your care Any changes since last visit?  no   Family History  Problem Relation Age of Onset  . Diabetes Father 4  . Unexplained death Mother 5  . CAD Neg Hx    Social History   Socioeconomic History  . Marital status:  Married    Spouse name: None  . Number of children: 2  . Years of education: None  . Highest education level: None  Social Needs  . Financial resource strain: None  . Food insecurity - worry: None  . Food insecurity - inability: None  . Transportation needs - medical: None  . Transportation needs - non-medical: None  Occupational History  . Occupation: Retired  Tobacco Use  . Smoking status: Never Smoker  . Smokeless tobacco: Never Used  Substance and Sexual Activity  . Alcohol use: No  . Drug use: No  . Sexual activity: None  Other Topics Concern  . None  Social History Narrative  . None   Past Surgical History:  Procedure Laterality Date  . CATARACT EXTRACTION W/ INTRAOCULAR LENS  IMPLANT, BILATERAL Bilateral   . CORONARY ANGIOPLASTY WITH STENT PLACEMENT  2004   "LAD & one of the circumflex"  . CORONARY ARTERY BYPASS GRAFT  2011   CABG X4   Past Medical History:  Diagnosis Date  . Acute renal injury ([place]) 07/2016  . Anginal pain ([place]) 2004; 2011  . CAD (coronary artery disease)    status post stenting of the RCA status post CABG. (left internal mammary artery to  LAD, saphenous vein graft to second diagonal, saphenous vein  graft to obtuse marginal 1, saphenous vein graft to posterior  descending).  . Cardiomyopathy     Mildly reduced EF (45%).   . Chronic kidney  disease (CKD), stage III (moderate) ([place])   . History of blood transfusion 1960s   "related to anemia"  . HTN (hypertension)   . Myocardial infarction ([place]) 2011  . Pleural effusion 2011   S/P "bypass"  . Shortness of breath   . Stroke ([place]) 09/19/2016   "left sided weakness" (09/20/2016)  . Type II diabetes mellitus ([place])    There were no vitals taken for this visit.  Opioid Risk Score:   Fall Risk Score:  `1  Depression screen PHQ 2/9  Depression screen [facility] 2/9 02/07/2017 11/29/2016 10/18/2016  Decreased Interest 0 0 0  Down, Depressed, Hopeless 0 0 0  PHQ - 2 Score 0 0 0  Altered sleeping - -  0  Tired, decreased energy - - 2  Change in appetite - - 0  Feeling bad or failure about yourself  - - 0  Trouble concentrating - - 0  Moving slowly or fidgety/restless - - 0  Suicidal thoughts - - 0  PHQ-9 Score - - 2  Difficult doing work/chores - - Not difficult at all  Some recent data might be hidden      Review of Systems  Constitutional: Negative.   HENT: Negative.   Eyes: Negative.   Respiratory: Negative.   Cardiovascular: Negative.   Gastrointestinal: Negative.   Endocrine: Negative.   Genitourinary: Negative.   Musculoskeletal: Negative.   Skin: Negative.   Allergic/Immunologic: Negative.   Neurological: Negative.   Hematological: Negative.   Psychiatric/Behavioral: Negative.        Objective:   Physical Exam  Constitutional: She is oriented to person, place, and time. She appears well-developed and well-nourished.  HENT:  Head: Normocephalic and atraumatic.  Eyes: Conjunctivae and EOM are normal. Pupils are equal, round, and reactive to light.  Neck: Normal range of motion.  Neurological: She is alert and oriented to person, place, and time. She displays no tremor. She exhibits normal muscle tone. Coordination abnormal.  Motor strength is 4/5 in the left deltoid, bicep, tricep, grip, hip flexor, knee extensor, ankle dorsiflexor 5/5 in the right deltoid, bicep, tricep, grip, hip flexor, knee extensor, ankle dorsiflexor There is decreased fine motor ability in the left upper extremity with decreased finger to thumb opposition. Patient does not have her walker with her, gait was not attempted. Sit to stand was with supervision assistance. No dysmetria finger-nose-finger Standing balance is fair  Psychiatric: She has a normal mood and affect. Her behavior is normal.  Nursing note and vitals reviewed.         Assessment & Plan:  1.   Left hemiparesis due to Right CVA, motor strength is starting to plateau, she continues with outpatient PT OT to help  upgrade her mobility and functional abilities.  She will continue for 1 more month with this. Physical medicine rehab follow-up in 6 weeks  2.  Delayed onset muscle soreness left quadricep muscle, most likely secondary to some eccentric contractions when she was practicing transfers repetitively to a low surface.  As discussed with patient no need to limit therapy, this should continue to improve over the next couple days.  We discussed using sports cream on a as needed basis to the left thigh.

## 2017-02-07 NOTE — Patient Instructions (Addendum)
The left thigh pain is post exercise muscle soreness which should cont to improve over the next couple days.   "delayed onset muscle soreness"  From eccentric exercise

## 2017-02-09 ENCOUNTER — Emergency Department (HOSPITAL_COMMUNITY): Payer: Medicare Other

## 2017-02-09 ENCOUNTER — Encounter (HOSPITAL_COMMUNITY): Payer: Self-pay

## 2017-02-09 ENCOUNTER — Emergency Department (HOSPITAL_COMMUNITY)
Admission: EM | Admit: 2017-02-09 | Discharge: 2017-02-09 | Disposition: A | Discharge status: Home / Self Care | Payer: Medicare Other | Attending: Emergency Medicine | Admitting: Emergency Medicine

## 2017-02-09 DIAGNOSIS — M25552 Pain in left hip: Secondary | ICD-10-CM | POA: Diagnosis present

## 2017-02-09 DIAGNOSIS — Z5321 Procedure and treatment not carried out due to patient leaving prior to being seen by health care provider: Secondary | ICD-10-CM | POA: Insufficient documentation

## 2017-02-09 LAB — BASIC METABOLIC PANEL
Anion gap: 10 (ref 5–15)
BUN: 31 mg/dL — AB (ref 6–20)
CHLORIDE: 103 mmol/L (ref 101–111)
CO2: 21 mmol/L — AB (ref 22–32)
CREATININE: 2.07 mg/dL — AB (ref 0.44–1.00)
Calcium: 10 mg/dL (ref 8.9–10.3)
GFR calc Af Amer: 24 mL/min — ABNORMAL LOW (ref >60)
GFR calc non Af Amer: 20 mL/min — ABNORMAL LOW (ref >60)
GLUCOSE: 275 mg/dL — AB (ref 65–99)
Potassium: 4 mmol/L (ref 3.5–5.1)
Sodium: 134 mmol/L — ABNORMAL LOW (ref 135–145)

## 2017-02-09 LAB — CBC WITH DIFFERENTIAL/PLATELET
Basophils Absolute: 0 10*3/uL (ref 0.0–0.1)
Basophils Relative: 0 %
Eosinophils Absolute: 0.1 10*3/uL (ref 0.0–0.7)
Eosinophils Relative: 2 %
HEMATOCRIT: 35.4 % — AB (ref 36.0–46.0)
HEMOGLOBIN: 11.5 g/dL — AB (ref 12.0–15.0)
LYMPHS ABS: 1.7 10*3/uL (ref 0.7–4.0)
Lymphocytes Relative: 28 %
MCH: 29.1 pg (ref 26.0–34.0)
MCHC: 32.5 g/dL (ref 30.0–36.0)
MCV: 89.6 fL (ref 78.0–100.0)
MONO ABS: 0.3 10*3/uL (ref 0.1–1.0)
MONOS PCT: 5 %
NEUTROS ABS: 3.8 10*3/uL (ref 1.7–7.7)
NEUTROS PCT: 65 %
Platelets: 274 10*3/uL (ref 150–400)
RBC: 3.95 MIL/uL (ref 3.87–5.11)
RDW: 13.9 % (ref 11.5–15.5)
WBC: 5.9 10*3/uL (ref 4.0–10.5)

## 2017-02-09 LAB — PROTIME-INR
INR: 0.92
Prothrombin Time: 12.3 seconds (ref 11.4–15.2)

## 2017-02-09 NOTE — ED Triage Notes (Signed)
Pt presents with pain to L hip after twisting and falling x 1 hour ago.  Pt landed on L hip, is not able to bear weight.  Pt did not hit head (is on Plavix).

## 2017-02-10 ENCOUNTER — Ambulatory Visit: Payer: Medicare Other | Admitting: Physical Therapy

## 2017-02-10 ENCOUNTER — Ambulatory Visit: Payer: Medicare Other | Admitting: Occupational Therapy

## 2017-02-13 ENCOUNTER — Ambulatory Visit: Payer: Medicare Other | Admitting: Physical Therapy

## 2017-02-13 ENCOUNTER — Ambulatory Visit: Payer: Medicare Other | Admitting: Occupational Therapy

## 2017-02-14 ENCOUNTER — Ambulatory Visit: Payer: Self-pay | Admitting: Neurology

## 2017-02-14 ENCOUNTER — Telehealth: Payer: Self-pay

## 2017-02-14 NOTE — Telephone Encounter (Signed)
PT no show for appt today. PEr Katharine Look in phone room,pt's back is hurting.

## 2017-02-15 ENCOUNTER — Encounter: Payer: Self-pay | Admitting: Neurology

## 2017-02-17 ENCOUNTER — Ambulatory Visit: Payer: Medicare Other | Admitting: Physical Therapy

## 2017-02-17 ENCOUNTER — Emergency Department (HOSPITAL_COMMUNITY)
Admission: EM | Admit: 2017-02-17 | Discharge: 2017-02-17 | Disposition: A | Discharge status: Home / Self Care | Payer: Medicare Other | Attending: Emergency Medicine | Admitting: Emergency Medicine

## 2017-02-17 ENCOUNTER — Other Ambulatory Visit: Payer: Self-pay

## 2017-02-17 ENCOUNTER — Ambulatory Visit: Payer: Medicare Other | Admitting: Occupational Therapy

## 2017-02-17 ENCOUNTER — Emergency Department (HOSPITAL_COMMUNITY): Payer: Medicare Other

## 2017-02-17 ENCOUNTER — Encounter (HOSPITAL_COMMUNITY): Payer: Self-pay

## 2017-02-17 DIAGNOSIS — Z79899 Other long term (current) drug therapy: Secondary | ICD-10-CM | POA: Diagnosis not present

## 2017-02-17 DIAGNOSIS — N183 Chronic kidney disease, stage 3 (moderate): Secondary | ICD-10-CM | POA: Insufficient documentation

## 2017-02-17 DIAGNOSIS — W010XXA Fall on same level from slipping, tripping and stumbling without subsequent striking against object, initial encounter: Secondary | ICD-10-CM | POA: Insufficient documentation

## 2017-02-17 DIAGNOSIS — I2510 Atherosclerotic heart disease of native coronary artery without angina pectoris: Secondary | ICD-10-CM | POA: Diagnosis not present

## 2017-02-17 DIAGNOSIS — Z7902 Long term (current) use of antithrombotics/antiplatelets: Secondary | ICD-10-CM | POA: Diagnosis not present

## 2017-02-17 DIAGNOSIS — I129 Hypertensive chronic kidney disease with stage 1 through stage 4 chronic kidney disease, or unspecified chronic kidney disease: Secondary | ICD-10-CM | POA: Diagnosis not present

## 2017-02-17 DIAGNOSIS — R52 Pain, unspecified: Secondary | ICD-10-CM

## 2017-02-17 DIAGNOSIS — E1122 Type 2 diabetes mellitus with diabetic chronic kidney disease: Secondary | ICD-10-CM | POA: Diagnosis not present

## 2017-02-17 DIAGNOSIS — Z8673 Personal history of transient ischemic attack (TIA), and cerebral infarction without residual deficits: Secondary | ICD-10-CM | POA: Insufficient documentation

## 2017-02-17 DIAGNOSIS — Z951 Presence of aortocoronary bypass graft: Secondary | ICD-10-CM | POA: Insufficient documentation

## 2017-02-17 DIAGNOSIS — Z7984 Long term (current) use of oral hypoglycemic drugs: Secondary | ICD-10-CM | POA: Insufficient documentation

## 2017-02-17 DIAGNOSIS — M79605 Pain in left leg: Secondary | ICD-10-CM | POA: Diagnosis present

## 2017-02-17 DIAGNOSIS — W19XXXA Unspecified fall, initial encounter: Secondary | ICD-10-CM

## 2017-02-17 MED ORDER — ACETAMINOPHEN 325 MG PO TABS
650.0000 mg | ORAL_TABLET | Freq: Once | ORAL | Status: AC
  Administered 2017-02-17: 650 mg via ORAL
  Filled 2017-02-17: qty 2

## 2017-02-17 NOTE — ED Provider Notes (Signed)
[facility] DEPT Provider Note   CSN: [id number] Arrival date & time: 02/17/17  1316     History   Chief Complaint Chief Complaint  Patient presents with  . Fall    HPI [name] is a 84 y.o. female.  HPI   Pt is an 84 y/o female with a h/o HTN, CAD s/p stenting & CABG, MI, CVA, T2DM who presents to the ED today with her son s/p fall that occurred 3 hoursPTA. Pts son states that she attempted to get out of bed, turned to her left, and lost her balance falling onto a rail onto her left lateral thigh.  [name] witnessed the fall and states he caught her before she hit the ground.  He is denying any head injury, loss of consciousness, syncope, nausea, vomiting since the fall.  Son states that patient has been unable to bear weight since the fall due to pain.  Patient endorses pain to the left upper thigh.  Patient denies any prodrome to the fall including denying any chest pain, shortness of breath, syncope, or vision changes.  She continues to deny any chest pain, shortness of breath, palpitations, lightheadedness, headaches, vision changes, abdominal pain since the fall.  No pain to the remainder of bilateral upper and lower extremities. Denies neck pain, back pain, numbness to BLE, urinary/fecal incontinence. Pt has chronic LUE & LLE weakness s/p CVA. No changes in mental status noted by patient's son.  [name] is a Psychiatric nurse.  Past Medical History:  Diagnosis Date  . Acute renal injury ([place]) 07/2016  . Anginal pain ([place]) 2004; 2011  . CAD (coronary artery disease)    status post stenting of the RCA status post CABG. (left internal mammary artery to  LAD, saphenous vein graft to second diagonal, saphenous vein  graft to obtuse marginal 1, saphenous vein graft to posterior  descending).  . Cardiomyopathy     Mildly reduced EF (45%).   . Chronic kidney disease (CKD), stage III (moderate) ([place])   . History of blood transfusion 1960s   "related to anemia"  . HTN (hypertension)   . Myocardial infarction ([place]) 2011  . Pleural effusion 2011   S/P "bypass"  . Shortness of breath   . Stroke ([place]) 09/19/2016   "left sided weakness" (09/20/2016)  . Type II diabetes mellitus [facility])     Patient Active Problem List   Diagnosis Date Noted  . Rectal bleed 10/13/2016  . Malnutrition of moderate degree 10/13/2016  . Chest pain with moderate risk for cardiac etiology 10/06/2016  . Labile blood pressure   . Stage 3 chronic kidney disease ([place])   . AKI (acute kidney injury) ([place])   . Acute blood loss anemia   . Small vessel disease 09/22/2016  . Hemiparesis affecting left side as late effect of stroke ([place]) 09/22/2016  . Gait disturbance, post-stroke 09/22/2016  . Stroke (cerebrum) ([place]) 09/20/2016  . Diabetes mellitus with complication ([place])   . Ischemic stroke ([place])   . Status post intraocular lens implant 06/23/2011  . Glaucoma suspect 12/30/2010  . Macular hole 12/30/2010  . Nonproliferative diabetic retinopathy ([place]) 12/30/2010  . Syncope 06/17/2010  . CARDIOMYOPATHY 04/15/2010  . PLEURAL EFFUSION 04/14/2010  . SHORTNESS OF BREATH 04/14/2010  . CORONARY ATHEROSCLEROSIS NATIVE CORONARY ARTERY 01/19/2009  . Hyperlipidemia 01/06/2009  . Diabetes mellitus ([place]) 01/05/2009  . Essential hypertension 01/05/2009  . Coronary atherosclerosis 01/05/2009    Past Surgical History:  Procedure Laterality Date  . CATARACT  EXTRACTION W/ INTRAOCULAR LENS  IMPLANT, BILATERAL Bilateral   . CORONARY ANGIOPLASTY WITH STENT PLACEMENT  2004   "LAD & one of the circumflex"  . CORONARY ARTERY BYPASS GRAFT  2011   CABG X4    OB History    No data available       Home Medications    Prior to Admission medications   Medication Sig Start Date End Date Taking? Authorizing Provider  acetaminophen (TYLENOL) 500 MG tablet Take 1,000 mg by mouth every 6 (six) hours as needed for headache.   Yes [provider]  amLODipine  (NORVASC) 10 MG tablet TAKE ONE TABLET BY MOUTH ONCE DAILY. 11/03/16  Yes Minus Breeding, MD  cholecalciferol (VITAMIN D) 400 units TABS tablet Take 1 tablet (400 Units total) by mouth daily. 10/06/16  Yes Angiulli, Lavon Paganini, PA-C  cloNIDine (CATAPRES) 0.1 MG tablet Take 0.1 mg by mouth 2 (two) times daily.   Yes [provider]  clopidogrel (PLAVIX) 75 MG tablet Take 1 tablet (75 mg total) by mouth daily. 10/06/16 10/06/17 Yes Angiulli, Lavon Paganini, PA-C  glipiZIDE (GLUCOTROL) 10 MG tablet Take 1 tablet (10 mg total) by mouth daily before breakfast. 10/06/16  Yes Angiulli, Lavon Paganini, PA-C  isosorbide mononitrate (IMDUR) 30 MG 24 hr tablet Take 1 tablet (30 mg total) by mouth daily. 10/08/16  Yes Lyda Jester M, PA-C  metoprolol tartrate (LOPRESSOR) 50 MG tablet Take 50 mg by mouth 2 (two) times daily.   Yes [provider]  polyethylene glycol (MIRALAX / GLYCOLAX) packet Take 17 g by mouth daily. 10/16/16  Yes Regalado, Belkys A, MD  senna (SENOKOT) 8.6 MG TABS tablet Take 1 tablet (8.6 mg total) by mouth daily. 10/16/16  Yes Regalado, Belkys A, MD  nitroGLYCERIN (NITROSTAT) 0.4 MG SL tablet Place 1 tablet (0.4 mg total) under the tongue every 5 (five) minutes x 3 doses as needed for chest pain. 10/08/16   Lyda Jester M, PA-C  protein supplement shake (PREMIER PROTEIN) LIQD Take 325 mLs (11 oz total) by mouth daily. Patient not taking: Reported on 02/17/2017 10/15/16   Elmarie Shiley, MD    Family History Family History  Problem Relation Age of Onset  . Diabetes Father 45  . Unexplained death Mother 18  . CAD Neg Hx     Social History Social History   Tobacco Use  . Smoking status: Never Smoker  . Smokeless tobacco: Never Used  Substance Use Topics  . Alcohol use: No  . Drug use: No     Allergies   Patient has no known allergies.   Review of Systems Review of Systems  Constitutional: Negative for chills and fever.  HENT: Negative for ear pain and sore throat.    Eyes: Negative for pain and visual disturbance.  Respiratory: Negative for cough and shortness of breath.   Cardiovascular: Negative for chest pain and palpitations.  Gastrointestinal: Negative for abdominal pain, constipation, diarrhea, nausea and vomiting.  Genitourinary: Negative for dysuria and hematuria.  Musculoskeletal: Negative for arthralgias, back pain and neck pain.       LLE pain  Skin: Negative for color change and rash.  Neurological: Negative for seizures, syncope, facial asymmetry, light-headedness and headaches.  All other systems reviewed and are negative.    Physical Exam Updated Vital Signs BP (!) 177/66 (BP Location: Left Arm)   Pulse 66   Temp 98.3 F (36.8 C) (Oral)   Resp 20   Ht 5\' 3"  (1.6 m)   Wt  70.3 kg (155 lb)   SpO2 100%   BMI 27.46 kg/m   Physical Exam  Constitutional: She is oriented to person, place, and time. She appears well-developed and well-nourished. No distress.  HENT:  Head: Normocephalic and atraumatic.  Right Ear: External ear normal.  Left Ear: External ear normal.  No battle signs, no raccoons eyes, no rhinorrhea. No tenderness to palpation of the skull or face. No deformity or crepitus noted.  Eyes: Conjunctivae and EOM are normal. Pupils are equal, round, and reactive to light.  Neck: Normal range of motion. Neck supple.  Cardiovascular: Normal rate, regular rhythm, normal heart sounds and intact distal pulses.  No murmur heard. Pulmonary/Chest: Effort normal and breath sounds normal. No stridor. No respiratory distress. She has no wheezes.  Abdominal: Soft. There is no tenderness.  Musculoskeletal: She exhibits no edema.  No midline TTP. TTP to left lateral lower extremity with no obvious ecchymosis or swelling. No obvious deformity or stepoff. No left hip TTP. Pelvis stable to compression and is without TTP.  Neurological: She is alert and oriented to person, place, and time. No cranial nerve deficit. She exhibits normal  muscle tone. Coordination normal.  Mental Status:  Alert, thought content appropriate, able to give a coherent history. Speech fluent without evidence of aphasia. Able to follow 2 step commands without difficulty.  Cranial Nerves:  II: pupils equal, round, reactive to light III,IV, VI: ptosis not present, extra-ocular motions intact bilaterally  V,VII: smile symmetric, facial light touch sensation equal VIII: hearing grossly normal to voice  X: uvula elevates symmetrically  XI: bilateral shoulder shrug symmetric and strong XII: midline tongue extension without fassiculations Motor:  Normal tone. 5/5 strength of BUE and BLE major muscle groups including strong and equal grip strength and dorsiflexion/plantar flexion (did not note unilateral weakness reported by family) Sensory: light touch normal in all extremities. CV: 2+ radial and DP/PT pulses  Skin: Skin is warm and dry. Capillary refill takes less than 2 seconds.  Psychiatric: She has a normal mood and affect.  Nursing note and vitals reviewed.    ED Treatments / Results  Labs (all labs ordered are listed, but only abnormal results are displayed) Labs Reviewed - No data to display  EKG  EKG Interpretation None       Radiology Ct Femur Left Wo Contrast  Result Date: 02/17/2017 CLINICAL DATA:  Fall with lateral hip pain. EXAM: CT OF THE LOWER LEFT EXTREMITY WITHOUT CONTRAST TECHNIQUE: Multidetector CT imaging of the lower left extremity was performed according to the standard protocol. COMPARISON:  Radiograph 02/17/2017 FINDINGS: Bones/Joint/Cartilage The LEFT hip is located. No evidence of femoral neck fracture. No evidence of proximal femur fracture for distal femur fracture. Ligaments Suboptimally assessed by CT. Muscles and Tendons No evidence of hematoma over the LEFT hip. Particular attention directed towards the site skin marking. No evidence of muscular hematoma. No fluid collections. No subcutaneous bruising. Soft  tissues Vascular calcifications noted. IMPRESSION: 1. No evidence of LEFT hip or femur fracture. 2. No soft tissue injury identified. Electronically Signed   By: Suzy Bouchard M.D.   On: 02/17/2017 19:28   Dg Femur Min 2 Views Left  Result Date: 02/17/2017 CLINICAL DATA:  Fall with left hip pain EXAM: LEFT FEMUR 2 VIEWS COMPARISON:  None. FINDINGS: No fracture or malalignment. Extensive vascular calcifications. Pubic symphysis and rami are intact. Moderate degenerative changes at the knee. IMPRESSION: No acute osseous abnormality. Electronically Signed   By: Madie Reno.D.  On: 02/17/2017 16:57    Procedures Procedures (including critical care time)  Medications Ordered in ED Medications  acetaminophen (TYLENOL) tablet 650 mg (650 mg Oral Given 02/17/17 1941)     Initial Impression / Assessment and Plan / ED Course  I have reviewed the triage vital signs and the nursing notes.  Pertinent labs & imaging results that were available during my care of the patient were reviewed by me and considered in my medical decision making (see chart for details).    Discussed pt presentation, exam findings, and negative xray with Dr. Johnney Killian, who evaluated the pt in the ED and agrees with the plan to give Tylenol and order a CT of the femur for further evaluation of LLE pain.  Rechecked pt. Pt states that she still has pain to the left femur area. Has not received tylenol. Nursing now in room to administer tylenol. Attempted to have pt bear weight in ED. Pt able to stand for a few seconds, but knees buckle due to pain per pt. Pt denies that she is having weakness to lower extremities. Discussed plan for discharged with close outpatient f/u. Pt's son is comfortable with the plan and states that he will be staying with her and will take care of her as she recovers. Discussed giving stronger pain medicine, but pts son states that pt's pain usually responds well to tylenol. Return precautions given and  all questions answered.  Final Clinical Impressions(s) / ED Diagnoses   Final diagnoses:  Fall, initial encounter  Left leg pain   84 y/o F presents with LLE pain s/p fall. Xray negative for femur fracture. CT negative for occult fracture or soft tissue injury. Pt able to bear weight for a few seconds in the ED. No other injuries noted from fall and pt did not hit head. No focal neuro deficits on exam and no indication for imaging of brain. Pt will have adequate support at home as she recovers as she is currently staying with her son who is a physician. He is comfortable with plan for d/c and will have pt f/u closely as an outpatient. Pt nontoxic appearing and in NAD. Mildly HTN, but appears to be baseline for pt and is likely due to ongoing pain. Pt stable for d/c. Return precautions given.  ED Discharge Orders    None       Rodney Booze, [place] 02/18/17 0246    Charlesetta Shanks, MD 02/23/17 1335

## 2017-02-17 NOTE — ED Notes (Signed)
Bed: [facility] Expected date:  Expected time:  Means of arrival:  Comments: Hold for Room 5

## 2017-02-17 NOTE — ED Provider Notes (Signed)
Medical screening examination/treatment/procedure(s) were conducted as a shared visit with non-physician practitioner(s) and myself.  I personally evaluated the patient during the encounter.   EKG Interpretation None     Had a mechanical fall hitting her left outside femur on a metal bed rail..  Patient did not fall to the floor.  She has had however ongoing severe lateral thigh pain and too much pain for weightbearing.  Patient is alert and nontoxic.  Clinically well in appearance.  No respiratory distress.  Objectively soft tissues of the lateral thigh and medial thigh are soft and pliable without induration or palpable deep hematoma.  Skin normal without abrasion or laceration or erythema.  Normal range of motion at the knee and the hip.  Lower leg nontender.  Patient however is having severe pain with any weightbearing.  We will proceed with CT scan to rule out occult fracture and added soft tissue evaluation.   Charlesetta Shanks, MD 02/17/17 1729

## 2017-02-17 NOTE — Discharge Instructions (Signed)
Please use over the counter Tylenol to manage your pain. You may also use warm and cold compresses to reduce your pain. Please follow up with your primary care provider within 3-5 to check resolution of your symptoms. Please return to the ER for any new or worsening symptoms including any bilateral lower extremity weakness, urinary retention/incontinence, or bowel incontinence.

## 2017-02-17 NOTE — ED Triage Notes (Signed)
[name] reports that he witnessed the patient getting out of bed and she fell against the iron railing of the bed this AM. [name] reports that the patient iis unable to bear weight on the left leg.

## 2017-02-21 ENCOUNTER — Ambulatory Visit: Payer: Medicare Other | Admitting: Occupational Therapy

## 2017-02-21 ENCOUNTER — Ambulatory Visit: Payer: Medicare Other | Admitting: Physical Therapy

## 2017-02-24 ENCOUNTER — Ambulatory Visit: Payer: Medicare Other | Admitting: Occupational Therapy

## 2017-02-24 ENCOUNTER — Encounter: Payer: Self-pay | Admitting: Physical Therapy

## 2017-02-24 ENCOUNTER — Ambulatory Visit: Payer: Medicare Other | Admitting: Physical Therapy

## 2017-02-24 NOTE — Therapy (Unsigned)
[facility] [address] [place], [place], [address] Phone: [phone]   Fax:  [phone]  Patient Details  Name: [name] MRN: [id number] Date of Birth: August 08, 1927 Referring Provider:  No ref. provider found  Encounter Date: 02/24/2017  Patient again had to cancel today's appointments due to persistent thigh/hip pain from fall 02/17/17. She was seen in ED at that time with both hip xray and CT of femur negative.   She reports pain remains so severe that if she tries to put her full weight on her leg, it gives out due to pain "and I would fall." Reports she has been using walker and family's assist to walk.   She reports she has left a message with Dr. Letta Pate office today and hopes to get in to see him early next week. "I want to find out what is wrong so it can get healed."  Patient desperately wants to attend therapy and does not want to be discharged. Agreed to cancel Monday 02/27/17 appointments. She will be in touch with [facility] once she has been in contact with Dr. Letta Pate and hopefully we will know more re: keeping or canceling future appointments.   Rexanne Mano, PT 02/24/2017, 3:42 PM  [facility] [address] [place], [place], [address] Phone: [phone]   Fax:  [phone]

## 2017-02-27 ENCOUNTER — Ambulatory Visit: Payer: Medicare Other | Admitting: Physical Therapy

## 2017-02-27 ENCOUNTER — Ambulatory Visit: Payer: Medicare Other | Admitting: Occupational Therapy

## 2017-02-27 ENCOUNTER — Telehealth: Payer: Self-pay | Admitting: *Deleted

## 2017-02-27 NOTE — Telephone Encounter (Signed)
Still having problems with left leg, she cannot put any weight on it and her therapy is on hold.  Please advise

## 2017-02-27 NOTE — Telephone Encounter (Signed)
Please have her see me

## 2017-03-02 ENCOUNTER — Ambulatory Visit: Payer: Medicare Other | Admitting: Physical Medicine & Rehabilitation

## 2017-03-02 ENCOUNTER — Encounter: Payer: Self-pay | Admitting: Physical Medicine & Rehabilitation

## 2017-03-02 VITALS — BP 184/75 | HR 62

## 2017-03-02 DIAGNOSIS — M79652 Pain in left thigh: Secondary | ICD-10-CM

## 2017-03-02 DIAGNOSIS — I69398 Other sequelae of cerebral infarction: Secondary | ICD-10-CM

## 2017-03-02 DIAGNOSIS — I69354 Hemiplegia and hemiparesis following cerebral infarction affecting left non-dominant side: Secondary | ICD-10-CM

## 2017-03-02 DIAGNOSIS — R269 Unspecified abnormalities of gait and mobility: Secondary | ICD-10-CM

## 2017-03-02 NOTE — Progress Notes (Signed)
Subjective:    [name], female    DOB: 29-Dec-1927, 84 y.o.   MRN: [id number] 84 year old right-handed female with history of CAD with CABG, diabetes mellitus, CKD stage 3.  Lives with her daughter, independent prior to admission.  Presented September 20, 2016, with left-sided weakness.  Cranial CT scan negative.  The patient did not receive tPA.  MRI of the brain showed 11 mm acute early subacute infarction involving the right posterior limb of internal capsule and lateral thalamus.  MRA with no large vessel occlusion or aneurysm. Echocardiogram with ejection fraction of 77%, grade 1 diastolic dysfunction.  Maintained on Plavix for CVA prophylaxis. HPI Patient was doing well after discontinuing home health therapy she was ambulating with a walker modified independent.  She started outpatient therapy and was working on hip abductor exercises given her hip abductor weakness.  Had therapy on 02/03/2017 and on 02/04/2017 awakened with left sided thigh pain. Was seen in office 02/07/2017, impression was delayed onset muscle soreness as she felt a little bit better.  She had some exacerbation of pain after fall went to the ED 02/09/2017 with left hip x-rays demonstrating no evidence of fracture.  Another ED visit on 02/17/2017 for worsening of pain CT of the thigh showed no evidence of hematoma no evidence of fracture. No numbness or tingling going down the left leg.  No shooting pains from the back into the leg.  She does not bear a lot of weight on the left thigh due to pain.  She ambulates with a walker with supervision assistance very short distances.  Used a wheelchair to get to the office today  CLINICAL DATA:  Left hip pain after twist and fall 1 hour ago.  EXAM: DG HIP (WITH OR WITHOUT PELVIS) 2-3V LEFT  COMPARISON:  Abdomen radiographs 10/13/2016  FINDINGS: Mild eccentric disc space narrowing and flattening on the right at L4-5. Associated lower lumbar facet arthropathy. Bony  pelvis appears intact. Osteoarthritis of the right hip with bone-on-bone apposition of the femoral head with acetabular roof. Rim of osteophytes noted about the right femoral head. Iliac and femoral arteriosclerosis bilaterally. Numerous phleboliths are seen in the pelvis.  IMPRESSION: 1. Lower lumbar degenerative disc disease and facet arthropathy as above. 2. Osteoarthritis of the right hip with bone-on-bone apposition of the right femoral head with acetabulum and prominent osteophytes at the femoral head-neck junction. 3. No acute osseous abnormality of the bony pelvis and left hip.   Electronically Signed   By: Ashley Royalty M.D.   On: 02/09/2017 21:06  CLINICAL DATA:  Fall with lateral hip pain.  EXAM: CT OF THE LOWER LEFT EXTREMITY WITHOUT CONTRAST  TECHNIQUE: Multidetector CT imaging of the lower left extremity was performed according to the standard protocol.  COMPARISON:  Radiograph 02/17/2017  FINDINGS: Bones/Joint/Cartilage  The LEFT hip is located. No evidence of femoral neck fracture. No evidence of proximal femur fracture for distal femur fracture.  Ligaments  Suboptimally assessed by CT.  Muscles and Tendons  No evidence of hematoma over the LEFT hip. Particular attention directed towards the site skin marking. No evidence of muscular hematoma. No fluid collections. No subcutaneous bruising.  Soft tissues  Vascular calcifications noted.  IMPRESSION: 1. No evidence of LEFT hip or femur fracture. 2. No soft tissue injury identified.   Electronically Signed   By: Suzy Bouchard M.D.   On: 02/17/2017 19:28  Pain Inventory Average Pain 4 Pain Right Now 4 My pain is intermittent and sharp  In the last 24 hours, has pain interfered with the following? General activity 6 Relation with others 6 Enjoyment of life 6 What TIME of day is your pain at its worst? when pressure is applied Sleep (in general) Good  Pain is worse  with: walking, standing and some activites Pain improves with: rest, heat/ice and medication Relief from Meds: 4  Mobility use a walker ability to climb steps?  no do you drive?  no use a wheelchair  Function retired  Neuro/Psych trouble walking  Prior Studies Any changes since last visit?  yes x-rays CT/MRI  Physicians involved in your care Any changes since last visit?  no   Family History  Problem Relation Age of Onset  . Diabetes Father 22  . Unexplained death Mother 20  . CAD Neg Hx    Social History   Socioeconomic History  . Marital status: Married    Spouse name: Not on file  . Number of children: 2  . Years of education: Not on file  . Highest education level: Not on file  Social Needs  . Financial resource strain: Not on file  . Food insecurity - worry: Not on file  . Food insecurity - inability: Not on file  . Transportation needs - medical: Not on file  . Transportation needs - non-medical: Not on file  Occupational History  . Occupation: Retired  Tobacco Use  . Smoking status: Never Smoker  . Smokeless tobacco: Never Used  Substance and Sexual Activity  . Alcohol use: No  . Drug use: No  . Sexual activity: Not on file  Other Topics Concern  . Not on file  Social History Narrative  . Not on file   Past Surgical History:  Procedure Laterality Date  . CATARACT EXTRACTION W/ INTRAOCULAR LENS  IMPLANT, BILATERAL Bilateral   . CORONARY ANGIOPLASTY WITH STENT PLACEMENT  2004   "LAD & one of the circumflex"  . CORONARY ARTERY BYPASS GRAFT  2011   CABG X4   Past Medical History:  Diagnosis Date  . Acute renal injury ([place]) 07/2016  . Anginal pain ([place]) 2004; 2011  . CAD (coronary artery disease)    status post stenting of the RCA status post CABG. (left internal mammary artery to  LAD, saphenous vein graft to second diagonal, saphenous vein  graft to obtuse marginal 1, saphenous vein graft to posterior  descending).  . Cardiomyopathy      Mildly reduced EF (45%).   . Chronic kidney disease (CKD), stage III (moderate) ([place])   . History of blood transfusion 1960s   "related to anemia"  . HTN (hypertension)   . Myocardial infarction ([place]) 2011  . Pleural effusion 2011   S/P "bypass"  . Shortness of breath   . Stroke ([place]) 09/19/2016   "left sided weakness" (09/20/2016)  . Type II diabetes mellitus ([place])    There were no vitals taken for this visit.  Opioid Risk Score:   Fall Risk Score:  `1  Depression screen PHQ 2/9  Depression screen [facility] 2/9 02/07/2017 11/29/2016 10/18/2016  Decreased Interest 0 0 0  Down, Depressed, Hopeless 0 0 0  PHQ - 2 Score 0 0 0  Altered sleeping - - 0  Tired, decreased energy - - 2  Change in appetite - - 0  Feeling bad or failure about yourself  - - 0  Trouble concentrating - - 0  Moving slowly or fidgety/restless - - 0  Suicidal thoughts - - 0  PHQ-9 Score - -  2  Difficult doing work/chores - - Not difficult at all  Some recent data might be hidden     Review of Systems  Constitutional: Negative.   HENT: Negative.   Eyes: Negative.   Respiratory: Negative.   Cardiovascular: Negative.   Gastrointestinal: Negative.   Endocrine: Negative.   Genitourinary: Negative.   Musculoskeletal: Positive for gait problem and joint swelling.  Skin: Negative.   Allergic/Immunologic: Negative.   Hematological: Negative.   Psychiatric/Behavioral: Negative.   All other systems reviewed and are negative.      Objective:   Physical Exam  Constitutional: She is oriented to person, place, and time. She appears well-developed and well-nourished. No distress.  HENT:  Head: Normocephalic and atraumatic.  Eyes: Conjunctivae are normal. Pupils are equal, round, and reactive to light.  Neck: Normal range of motion.  Musculoskeletal:  Has tenderness palpation lateral aspect of the left thigh at the midpoint.  Also mild tenderness over the greater trochanter of the left hip.  No tenderness in the  lumbar paraspinal area no tenderness in the anterior thigh or over the medial thigh.  No pain or effusion noted in the right or left knee. Hip range of motion is mildly reduced internal and external rotation bilaterally but without pain. She stands and maintains most of her weight on the right lower extremity.  She states this is due to pain  No masses palpable in the left thigh area.  Neurological: She is alert and oriented to person, place, and time.  Skin: She is not diaphoretic.  Psychiatric: She has a normal mood and affect. Her behavior is normal. Judgment and thought content normal.  Nursing note and vitals reviewed.  Motor strength is 5/5 in the right hip flexor knee extensor ankle dorsiflexor 5-/5 in the left hip flexor knee extensor ankle dorsiflexor. Sensation report is normal bilateral anterior medial and lateral thigh  Tone appears normal in the left lower limb      Assessment & Plan:  1.  Left thigh pain suspect soft tissue strain.  No obvious hematoma noted on CT of the femur.  She is still unable to bear weight approximately 3 weeks post, for that reason will check MRI of the left femur and thigh looking for musculotendinous tear Gust with the patient as well as her son who is a physician. We will see the patient back in approximately 2 weeks to review. Hold outpatient PT OT Over half of the 25 min visit was spent counseling and coordinating care.

## 2017-03-02 NOTE — Patient Instructions (Addendum)
MRI Left thigh   No therapy until released

## 2017-03-03 ENCOUNTER — Ambulatory Visit: Payer: Medicare Other | Admitting: Occupational Therapy

## 2017-03-03 ENCOUNTER — Ambulatory Visit: Payer: Medicare Other | Admitting: Physical Therapy

## 2017-03-03 ENCOUNTER — Telehealth: Payer: Self-pay | Admitting: Physical Therapy

## 2017-03-03 NOTE — Telephone Encounter (Signed)
Patient had called front office to cancel PT and OT appointments for 03/03/17 after seeing Dr. Letta Pate for continued thigh pain and inability to weight-bear.  Returned call and spoke with Ms. Momon re: Dr. Letta Pate plan to order MRI of femur and need to cancel all of patient's remaining appointments (scheduled only thru 03/09/17) anticipating she will need time to get MRI completed and have follow-up appointment with Dr. Letta Pate for clearance to return to therapies.   Patient disappointed but acknowledged this was necessary. She is eager to resume therapies as soon as possible. She will call to schedule PT and OT appointment when cleared by MD to return.

## 2017-03-07 ENCOUNTER — Encounter: Payer: Medicare Other | Admitting: Occupational Therapy

## 2017-03-07 ENCOUNTER — Ambulatory Visit: Payer: Medicare Other | Admitting: Physical Therapy

## 2017-03-08 ENCOUNTER — Ambulatory Visit
Admission: RE | Admit: 2017-03-08 | Discharge: 2017-03-08 | Disposition: A | Discharge status: Home / Self Care | Payer: Medicare Other | Source: Ambulatory Visit | Attending: Physical Medicine & Rehabilitation | Admitting: Physical Medicine & Rehabilitation

## 2017-03-08 DIAGNOSIS — M79652 Pain in left thigh: Secondary | ICD-10-CM

## 2017-03-09 ENCOUNTER — Encounter: Payer: Medicare Other | Admitting: Occupational Therapy

## 2017-03-09 ENCOUNTER — Ambulatory Visit: Payer: Medicare Other | Admitting: Physical Therapy

## 2017-03-10 NOTE — Telephone Encounter (Signed)
Order additional PT 2x per wk x 4 wks

## 2017-03-16 ENCOUNTER — Ambulatory Visit: Payer: Medicare Other | Admitting: Physical Medicine & Rehabilitation

## 2017-03-20 ENCOUNTER — Encounter: Payer: Self-pay | Admitting: Physical Medicine & Rehabilitation

## 2017-03-20 ENCOUNTER — Ambulatory Visit: Payer: Medicare Other | Admitting: Physical Medicine & Rehabilitation

## 2017-03-20 ENCOUNTER — Encounter: Payer: Medicare Other | Attending: Physical Medicine & Rehabilitation

## 2017-03-20 VITALS — BP 151/71 | HR 62

## 2017-03-20 DIAGNOSIS — I69398 Other sequelae of cerebral infarction: Secondary | ICD-10-CM | POA: Diagnosis present

## 2017-03-20 DIAGNOSIS — Z951 Presence of aortocoronary bypass graft: Secondary | ICD-10-CM | POA: Insufficient documentation

## 2017-03-20 DIAGNOSIS — S76112D Strain of left quadriceps muscle, fascia and tendon, subsequent encounter: Secondary | ICD-10-CM

## 2017-03-20 DIAGNOSIS — I2510 Atherosclerotic heart disease of native coronary artery without angina pectoris: Secondary | ICD-10-CM | POA: Diagnosis not present

## 2017-03-20 DIAGNOSIS — R269 Unspecified abnormalities of gait and mobility: Secondary | ICD-10-CM | POA: Diagnosis not present

## 2017-03-20 DIAGNOSIS — I69354 Hemiplegia and hemiparesis following cerebral infarction affecting left non-dominant side: Secondary | ICD-10-CM | POA: Insufficient documentation

## 2017-03-20 DIAGNOSIS — I252 Old myocardial infarction: Secondary | ICD-10-CM | POA: Diagnosis not present

## 2017-03-20 DIAGNOSIS — E1122 Type 2 diabetes mellitus with diabetic chronic kidney disease: Secondary | ICD-10-CM | POA: Diagnosis not present

## 2017-03-20 DIAGNOSIS — I129 Hypertensive chronic kidney disease with stage 1 through stage 4 chronic kidney disease, or unspecified chronic kidney disease: Secondary | ICD-10-CM | POA: Diagnosis not present

## 2017-03-20 DIAGNOSIS — N183 Chronic kidney disease, stage 3 (moderate): Secondary | ICD-10-CM | POA: Diagnosis not present

## 2017-03-20 NOTE — Patient Instructions (Signed)
Continue Heat 70mins as needed  Cont massage to left thigh  Continue walking as much as possible  Will need to resart PT once pain is a little

## 2017-03-20 NOTE — Progress Notes (Signed)
Subjective:    [name], female    DOB: [birth year], 84 y.o.   MRN: [id number]  HPI 84 year old female with history of coronary artery disease diabetes chronic kidney disease who suffered a stroke on September 20, 2016, right posterior limb in internal capsule infarct and lateral thalamus.  She completed both inpatient rehabilitation as well as home health and was attending outpatient rehabilitation.  She was doing well until early January when she developed left thigh pain.  This started without any trauma but then had a fall with further worsening of her pain.  X-rays and CT of the thigh and hip area demonstrate no fractures.  She had continued severe pain and on 03/02/2017 an MRI of the left thigh was ordered.  This was completed and demonstrated a left abductor tear as well as left vastus lateralis tear.  Patient has continued pain although certainly not worsening may be getting a little bit better.  She is ambulating with a walker now.  She does not feel up to going to physical therapy yet however. Pain Inventory Average Pain 4 Pain Right Now 4 My pain is intermittent, sharp, aching and worse on movemen  In the last 24 hours, has pain interfered with the following? General activity 4 Relation with others 4 Enjoyment of life 4 What TIME of day is your pain at its worst? varies on movement Sleep (in general) Good  Pain is worse with: walking, bending, standing and some activites Pain improves with: rest and medication Relief from Meds: 9  Mobility walk with assistance ability to climb steps?  no do you drive?  no use a wheelchair  Function retired  Neuro/Psych trouble walking  Prior Studies Any changes since last visit?  no  Physicians involved in your care Any changes since last visit?  no   Family History  Problem Relation Age of Onset  . Diabetes Father 37  . Unexplained death Mother 69  . CAD Neg Hx    Social History   Socioeconomic History  .  Marital status: Married    Spouse name: Not on file  . Number of children: 2  . Years of education: Not on file  . Highest education level: Not on file  Social Needs  . Financial resource strain: Not on file  . Food insecurity - worry: Not on file  . Food insecurity - inability: Not on file  . Transportation needs - medical: Not on file  . Transportation needs - non-medical: Not on file  Occupational History  . Occupation: Retired  Tobacco Use  . Smoking status: Never Smoker  . Smokeless tobacco: Never Used  Substance and Sexual Activity  . Alcohol use: No  . Drug use: No  . Sexual activity: Not on file  Other Topics Concern  . Not on file  Social History Narrative  . Not on file   Past Surgical History:  Procedure Laterality Date  . CATARACT EXTRACTION W/ INTRAOCULAR LENS  IMPLANT, BILATERAL Bilateral   . CORONARY ANGIOPLASTY WITH STENT PLACEMENT  2004   "LAD & one of the circumflex"  . CORONARY ARTERY BYPASS GRAFT  2011   CABG X4   Past Medical History:  Diagnosis Date  . Acute renal injury ([place]) 07/2016  . Anginal pain ([place]) 2004; 2011  . CAD (coronary artery disease)    status post stenting of the RCA status post CABG. (left internal mammary artery to  LAD, saphenous vein graft to second diagonal, saphenous vein  graft  to obtuse marginal 1, saphenous vein graft to posterior  descending).  . Cardiomyopathy     Mildly reduced EF (45%).   . Chronic kidney disease (CKD), stage III (moderate) ([place])   . History of blood transfusion 1960s   "related to anemia"  . HTN (hypertension)   . Myocardial infarction ([place]) 2011  . Pleural effusion 2011   S/P "bypass"  . Shortness of breath   . Stroke ([place]) 09/19/2016   "left sided weakness" (09/20/2016)  . Type II diabetes mellitus ([place])    There were no vitals taken for this visit.  Opioid Risk Score:   Fall Risk Score:  `1  Depression screen PHQ 2/9  Depression screen [facility] 2/9 02/07/2017 11/29/2016 10/18/2016  Decreased  Interest 0 0 0  Down, Depressed, Hopeless 0 0 0  PHQ - 2 Score 0 0 0  Altered sleeping - - 0  Tired, decreased energy - - 2  Change in appetite - - 0  Feeling bad or failure about yourself  - - 0  Trouble concentrating - - 0  Moving slowly or fidgety/restless - - 0  Suicidal thoughts - - 0  PHQ-9 Score - - 2  Difficult doing work/chores - - Not difficult at all  Some recent data might be hidden     Review of Systems  Constitutional: Negative.   HENT: Negative.   Eyes: Negative.   Respiratory: Negative.   Cardiovascular: Negative.   Gastrointestinal: Negative.   Endocrine: Negative.   Genitourinary: Negative.   Musculoskeletal: Positive for gait problem and joint swelling.  Skin: Negative.   Allergic/Immunologic: Negative.   Hematological: Negative.   Psychiatric/Behavioral: Negative.   All other systems reviewed and are negative.      Objective:   Physical Exam  Constitutional: She is oriented to person, place, and time. She appears well-developed and well-nourished. No distress.  HENT:  Head: Normocephalic and atraumatic.  Eyes: Conjunctivae and EOM are normal. Pupils are equal, round, and reactive to light.  Neck: Normal range of motion.  Neurological: She is alert and oriented to person, place, and time.  Skin: She is not diaphoretic.  Psychiatric: She has a normal mood and affect.  Nursing note and vitals reviewed.   Patient has tenderness palpation along the mid to upper left lateral thigh.  There is also some mild soreness along the abductor magnus and abductor longus muscles on the left side. Negative straight leg raise Motor strength remains at 4- at the left hip flexor knee extensor ankle dorsiflexor compared to 5 on the right side.      Assessment & Plan:  1.  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  I do not think she is quite ready for this from a pain standpoint in terms of being able to maximally participate. 2.  Left thigh pain with vastus lateralis partial muscle tear.  The abductor tear is not as symptomatic.  We discussed conservative care including heat muscle rubs and also the advised to stay active.  In addition I do think she would benefit from therapy although I think we can wait on this for several weeks until her pain is under better control.  We discussed avoiding any stronger narcotic analgesics because of fall risk.

## 2017-03-21 ENCOUNTER — Ambulatory Visit: Payer: Medicare Other | Admitting: Physical Medicine & Rehabilitation

## 2017-03-23 ENCOUNTER — Encounter: Payer: Self-pay | Admitting: Cardiology

## 2017-03-27 NOTE — Progress Notes (Signed)
HPI The patient presents for followup of CVA which she had late last year.   She had a right posterior limb internal capsule stroke.  We saw her during that admission for management of her BP.  She was in the hospital again following this for evaluation of syncope.   This was thought to be related to hypotension.  Echo was essentially normal.  She was in the ED for a fall in January.  It turns out she tore her vastus lateralis.  Is not restarted her rehab.  She is not having any cardiac complaints however.  She is had no further syncope.  She denies any chest pressure, neck or arm discomfort.  She is had no palpitations, shortness of breath, PND or orthopnea.  She gets around in her home with a walker.  Of note she did have some recent vaginal spotting.    No Known Allergies cad Current Outpatient Medications  Medication Sig Dispense Refill  . acetaminophen (TYLENOL) 500 MG tablet Take 1,000 mg by mouth every 6 (six) hours as needed for headache.    Marland Kitchen amLODipine (NORVASC) 10 MG tablet TAKE ONE TABLET BY MOUTH ONCE DAILY. 90 tablet 3  . cholecalciferol (VITAMIN D) 400 units TABS tablet Take 1 tablet (400 Units total) by mouth daily. 30 each 0  . cloNIDine (CATAPRES) 0.1 MG tablet Take 0.1 mg by mouth 2 (two) times daily.    . clopidogrel (PLAVIX) 75 MG tablet Take 1 tablet (75 mg total) by mouth daily. 30 tablet 0  . glipiZIDE (GLUCOTROL) 10 MG tablet Take 1 tablet (10 mg total) by mouth daily before breakfast. 30 tablet 0  . isosorbide mononitrate (IMDUR) 30 MG 24 hr tablet Take 1 tablet (30 mg total) by mouth daily. 30 tablet 5  . metoprolol tartrate (LOPRESSOR) 50 MG tablet Take 50 mg by mouth 2 (two) times daily.    . nitroGLYCERIN (NITROSTAT) 0.4 MG SL tablet Place 1 tablet (0.4 mg total) under the tongue every 5 (five) minutes x 3 doses as needed for chest pain. 25 tablet 2  . polyethylene glycol (MIRALAX / GLYCOLAX) packet Take 17 g by mouth daily. 14 each 0  . protein supplement shake  (PREMIER PROTEIN) LIQD Take 325 mLs (11 oz total) by mouth daily. 30 Can 0  . senna (SENOKOT) 8.6 MG TABS tablet Take 1 tablet (8.6 mg total) by mouth daily. 120 each 0   No current facility-administered medications for this visit.     Past Medical History:  Diagnosis Date  . Acute renal injury ([place]) 07/2016  . Anginal pain ([place]) 2004; 2011  . CAD (coronary artery disease)    status post stenting of the RCA status post CABG. (left internal mammary artery to  LAD, saphenous vein graft to second diagonal, saphenous vein  graft to obtuse marginal 1, saphenous vein graft to posterior  descending).  . Cardiomyopathy     Mildly reduced EF (45%).   . Chronic kidney disease (CKD), stage III (moderate) ([place])   . History of blood transfusion 1960s   "related to anemia"  . HTN (hypertension)   . Myocardial infarction ([place]) 2011  . Pleural effusion 2011   S/P "bypass"  . Shortness of breath   . Stroke ([place]) 09/19/2016   "left sided weakness" (09/20/2016)  . Type II diabetes mellitus ([place])     Past Surgical History:  Procedure Laterality Date  . CATARACT EXTRACTION W/ INTRAOCULAR LENS  IMPLANT, BILATERAL Bilateral   . CORONARY ANGIOPLASTY  WITH STENT PLACEMENT  2004   "LAD & one of the circumflex"  . CORONARY ARTERY BYPASS GRAFT  2011   CABG X4    ROS:  As stated in the HPI and negative for all other systems.  PHYSICAL EXAM BP (!) 142/64   Pulse 72   Ht 5\' 3"  (1.6 m)   Wt 145 lb (65.8 kg)   BMI 25.69 kg/m   PHYSICAL EXAM GEN:  No distress, slightly frail NECK:  No jugular venous distention at 90 degrees, waveform within normal limits, carotid upstroke brisk and symmetric, no bruits, no thyromegaly LUNGS:  Clear to auscultation bilaterally BACK:  No CVA tenderness CHEST:  Unremarkable HEART:  S1 and S2 within normal limits, no S3, no S4, no clicks, no rubs, no murmurs ABD:  Positive bowel sounds normal in frequency in pitch, no bruits, no rebound, no guarding, unable to assess  midline mass or bruit with the patient seated. EXT:  2 plus pulses throughout, no edema, no cyanosis no clubbing    EKG:  NA  ASSESSMENT AND PLAN  SYNCOPE - She has had no further syncope.  No further workup is planned.  CAD - She had a little bit of vaginal spotting on Plavix.  She will let me know if this continues at which point I would likely switch her back to aspirin.  For now because of the neurologic event I would like to continue the Plavix.   HYPERTENSION -  The blood pressure is at target. No change in medications is indicated. We will continue with therapeutic lifestyle changes (TLC).  DYSLIPIDEMIA -  This is followed by Dr. Concha Pyo.  No change in therapy is planned.  Her last LDL in August was 101.

## 2017-03-30 ENCOUNTER — Encounter: Payer: Self-pay | Admitting: Cardiology

## 2017-03-30 ENCOUNTER — Ambulatory Visit: Payer: Medicare Other | Admitting: Cardiology

## 2017-03-30 VITALS — BP 142/64 | HR 72 | Ht 63.0 in | Wt 145.0 lb

## 2017-03-30 DIAGNOSIS — I2510 Atherosclerotic heart disease of native coronary artery without angina pectoris: Secondary | ICD-10-CM

## 2017-03-30 DIAGNOSIS — I10 Essential (primary) hypertension: Secondary | ICD-10-CM

## 2017-03-30 NOTE — Patient Instructions (Signed)

## 2017-03-31 ENCOUNTER — Other Ambulatory Visit: Payer: Self-pay | Admitting: Cardiology

## 2017-04-19 ENCOUNTER — Other Ambulatory Visit: Payer: Self-pay | Admitting: Obstetrics and Gynecology

## 2017-04-19 DIAGNOSIS — N950 Postmenopausal bleeding: Secondary | ICD-10-CM

## 2017-04-25 ENCOUNTER — Ambulatory Visit: Payer: Medicare Other | Admitting: Neurology

## 2017-04-25 ENCOUNTER — Encounter: Payer: Self-pay | Admitting: Neurology

## 2017-04-25 VITALS — BP 177/79 | HR 65 | Wt 147.0 lb

## 2017-04-25 DIAGNOSIS — I6381 Other cerebral infarction due to occlusion or stenosis of small artery: Secondary | ICD-10-CM | POA: Diagnosis not present

## 2017-04-25 MED ORDER — ATORVASTATIN CALCIUM 40 MG PO TABS: 40.0000 mg | ORAL_TABLET | Freq: Every day | ORAL | 3 refills | Status: DC

## 2017-04-25 NOTE — Patient Instructions (Signed)
I had a long d/w patient about her recent lacunar stroke, risk for recurrent stroke/TIAs, personally independently reviewed imaging studies and stroke evaluation results and answered questions.Continue Plavix  for secondary stroke prevention and maintain strict control of hypertension with blood pressure goal below 130/90, diabetes with hemoglobin A1c goal below 6.5% and lipids with LDL cholesterol goal below 70 mg/dL. I also advised the patient to eat a healthy diet with plenty of whole grains, cereals, fruits and vegetables, exercise regularly and maintain ideal body weight . I will probably order Lipitor 40 mg daily which she was supposed to be on and I am not sure as to why this was discontinued..she was advised to use a walker at all time sand participate in physical therapy if approved by Dr. Dianna Limbo.Followup in the future with my nurse practitioner Janett Billow  in 6 months or call earlier if necessary

## 2017-04-25 NOTE — Progress Notes (Signed)
[facility] [address] [place]. [place] [address] [phone]       OFFICE FOLLOW-UP NOTE  [name] Date of Birth:  [birth year] Medical Record Number:  [id number]   HPI: Mr [name] is a 84 year old pleasant African American lady seen today for the first office follow up visit following her stroke in August 2018.history is obtained from the patient and review of electronic medical records. I have personally reviewed imaging films.Darlene Simsis an 84 y.o.femalewith history of CAD, diabetes, hypertension, hyperlipidemia presenting with left-sided weakness and decreased sensation on the left side.  Patient states that she was watching TV at 7 PM when she does that she had left arm and leg weakness. She also noted that she had left-sided decreased sensation. Due to the symptoms continuing today patient was brought to the emergency department. Patient states she does take an aspirin daily. Neuro was called for possible stroke. CT of head was negative for any acute bleed or infarct. Date last known well: Date: 09/19/2016 Time last known well: Time: 19:00.Patient was not administered IV t-PA secondary to arriving outside of the treatment window. She was admitted to General Neurology for further evaluation and treatment.MRI scan of the brain showed 11 mm acute/early subacute infarction involving right posterior limb of internal capsule and lateral thalamus.Trans-thoracic echocardiogram showed normal ejection fraction. LDL cholesterol was elevated at 101 mg percent and hemoglobin A1c was 7.7.Patient states she did outpatient physical and occupational therapy and made great recovery and was 90% better unfortunately she fell in January and developed partial tear of left vastus lateralis and thigh adductors and has been not with nonweightbearing.She is seeing Dr. Kayren Eaves for treatment. She underwent x-rays of the hips as well as MRI scan of the time which confirmed the diagnosis. She  is now improved somewhat and can bear some weight but has to walk with a walker only. She is living with her nephew and her daughter visits her 3 times a month from [place] and son twice a month from [place]. She is tolerating Plavix well without bleeding or bruising. Her blood pressure is quite good at home though today it is elevated in our office at 183/79. Her fasting sugars have all been running in the 100 range.      14 system review of systems is positive for  Leg pain, weakness PMH:  Past Medical History:  Diagnosis Date  . Acute renal injury ([place]) 07/2016  . Anginal pain ([place]) 2004; 2011  . CAD (coronary artery disease)    status post stenting of the RCA status post CABG. (left internal mammary artery to  LAD, saphenous vein graft to second diagonal, saphenous vein  graft to obtuse marginal 1, saphenous vein graft to posterior  descending).  . Cardiomyopathy     Mildly reduced EF (45%).   . Chronic kidney disease (CKD), stage III (moderate) ([place])   . History of blood transfusion 1960s   "related to anemia"  . HTN (hypertension)   . Myocardial infarction ([place]) 2011  . Pleural effusion 2011   S/P "bypass"  . Shortness of breath   . Stroke ([place]) 09/19/2016   "left sided weakness" (09/20/2016)  . Type II diabetes mellitus ([place])     Social History:  Social History   Socioeconomic History  . Marital status: Married    Spouse name: Not on file  . Number of children: 2  . Years of education: Not on file  . Highest education level: Not on file  Occupational History  . Occupation: Retired  Scientific laboratory technician  . Financial resource strain: Not on file  . Food insecurity:    Worry: Not on file    Inability: Not on file  . Transportation needs:    Medical: Not on file    Non-medical: Not on file  Tobacco Use  . Smoking status: Never Smoker  . Smokeless tobacco: Never Used  Substance and Sexual Activity  . Alcohol use: No  . Drug use: No  . Sexual activity: Not on file    Lifestyle  . Physical activity:    Days per week: Not on file    Minutes per session: Not on file  . Stress: Not on file  Relationships  . Social connections:    Talks on phone: Not on file    Gets together: Not on file    Attends religious service: Not on file    Active member of club or organization: Not on file    Attends meetings of clubs or organizations: Not on file    Relationship status: Not on file  . Intimate partner violence:    Fear of current or ex partner: Not on file    Emotionally abused: Not on file    Physically abused: Not on file    Forced sexual activity: Not on file  Other Topics Concern  . Not on file  Social History Narrative  . Not on file    Medications:   Current Outpatient Medications on File Prior to Visit  Medication Sig Dispense Refill  . acetaminophen (TYLENOL) 500 MG tablet Take 1,000 mg by mouth every 6 (six) hours as needed for headache.    Marland Kitchen amLODipine (NORVASC) 10 MG tablet TAKE ONE TABLET BY MOUTH ONCE DAILY. 90 tablet 3  . cholecalciferol (VITAMIN D) 400 units TABS tablet Take 1 tablet (400 Units total) by mouth daily. 30 each 0  . cloNIDine (CATAPRES) 0.1 MG tablet Take 0.1 mg by mouth 2 (two) times daily.    . clopidogrel (PLAVIX) 75 MG tablet Take 1 tablet (75 mg total) by mouth daily. 30 tablet 0  . glipiZIDE (GLUCOTROL) 10 MG tablet Take 1 tablet (10 mg total) by mouth daily before breakfast. 30 tablet 0  . isosorbide mononitrate (IMDUR) 30 MG 24 hr tablet TAKE 1 TABLET BY MOUTH ONCE DAILY 30 tablet 5  . metoprolol tartrate (LOPRESSOR) 50 MG tablet Take 50 mg by mouth 2 (two) times daily.    . nitroGLYCERIN (NITROSTAT) 0.4 MG SL tablet Place 1 tablet (0.4 mg total) under the tongue every 5 (five) minutes x 3 doses as needed for chest pain. 25 tablet 2  . polyethylene glycol (MIRALAX / GLYCOLAX) packet Take 17 g by mouth daily. 14 each 0  . protein supplement shake (PREMIER PROTEIN) LIQD Take 325 mLs (11 oz total) by mouth daily. 30  Can 0  . senna (SENOKOT) 8.6 MG TABS tablet Take 1 tablet (8.6 mg total) by mouth daily. 120 each 0   No current facility-administered medications on file prior to visit.     Allergies:  No Known Allergies  Physical Exam General: frail pleasant elderly African American lady, seated, in no evident distress Head: head normocephalic and atraumatic.  Neck: supple with no carotid or supraclavicular bruits Cardiovascular: regular rate and rhythm, no murmurs Musculoskeletal: no deformity Skin:  no rash/petichiae Vascular:  Normal pulses all extremities Vitals:   04/25/17 1312  BP: (!) 183/74  Pulse: 65   Neurologic Exam Mental Status: Awake  and fully alert. Oriented to place and time. Recent and remote memory intact. Attention span, concentration and fund of knowledge appropriate. Mood and affect appropriate.  Cranial Nerves: Fundoscopic exam reveals sharp disc margins. Pupils equal, briskly reactive to light. Extraocular movements full without nystagmus. Visual fields full to confrontation. Hearing intact. Facial sensation intact. Face, tongue, palate moves normally and symmetrically.  Motor: Normal bulk and tone. Normal strength in all tested extremity muscles except mild weakness of left thigh adduction and knee extension. Diminished fine finger movements on the left. Orbits right over left upper extremity. Sensory.: intact to touch ,pinprick .position and vibratory sensation.  Coordination: Rapid alternating movements normal in all extremities. Finger-to-nose and heel-to-shin performed accurately bilaterally. Gait and Station: deferred as patient did not bring her walker Reflexes: 1+ and symmetric. Toes downgoing.   NIHSS  1 Modified Rankin 3   ASSESSMENT: 84 year old lady with a right subcortical lacunar infarct in August 2018 from small vessel disease. Vascular risk factors of hypertension, diabetes and hyperlipidemia. Patient had made initial good recovery but her ambulation is  hampered by partial tear of her thigh muscles    PLAN: I had a long d/w patient about her recent lacunar stroke, risk for recurrent stroke/TIAs, personally independently reviewed imaging studies and stroke evaluation results and answered questions.Continue Plavix  for secondary stroke prevention and maintain strict control of hypertension with blood pressure goal below 130/90, diabetes with hemoglobin A1c goal below 6.5% and lipids with LDL cholesterol goal below 70 mg/dL. I also advised the patient to eat a healthy diet with plenty of whole grains, cereals, fruits and vegetables, exercise regularly and maintain ideal body weight . I will probably order Lipitor 40 mg daily which she was supposed to be on and I am not sure as to why this was discontinued..she was advised to use a walker at all time sand participate in physical therapy if approved by Dr. Dianna Limbo.Followup in the future with my nurse practitioner Janett Billow  in 6 months or call earlier if necessary Greater than 50% of time during this 25 minute visit was spent on counseling,explanation of diagnosis of lacunar infarct, planning of further management, discussion with patient and family and coordination of care Antony Contras, MD  [facility] [address] [place], [place] [address]  Phone [phone] Fax [phone] Note: This document was prepared with digital dictation and possible smart phrase technology. Any transcriptional errors that result from this process are unintentional

## 2017-04-26 ENCOUNTER — Ambulatory Visit
Admission: RE | Admit: 2017-04-26 | Discharge: 2017-04-26 | Disposition: A | Discharge status: Home / Self Care | Payer: Medicare Other | Source: Ambulatory Visit | Attending: Obstetrics and Gynecology | Admitting: Obstetrics and Gynecology

## 2017-04-26 DIAGNOSIS — N950 Postmenopausal bleeding: Secondary | ICD-10-CM

## 2017-05-02 ENCOUNTER — Encounter: Payer: Self-pay | Admitting: Physical Medicine & Rehabilitation

## 2017-05-02 ENCOUNTER — Encounter: Payer: Medicare Other | Attending: Physical Medicine & Rehabilitation

## 2017-05-02 ENCOUNTER — Ambulatory Visit: Payer: Medicare Other | Admitting: Physical Medicine & Rehabilitation

## 2017-05-02 VITALS — BP 157/69 | HR 64 | Resp 14 | Ht 63.0 in | Wt 145.0 lb

## 2017-05-02 DIAGNOSIS — I2510 Atherosclerotic heart disease of native coronary artery without angina pectoris: Secondary | ICD-10-CM | POA: Insufficient documentation

## 2017-05-02 DIAGNOSIS — I69354 Hemiplegia and hemiparesis following cerebral infarction affecting left non-dominant side: Secondary | ICD-10-CM

## 2017-05-02 DIAGNOSIS — I252 Old myocardial infarction: Secondary | ICD-10-CM | POA: Diagnosis not present

## 2017-05-02 DIAGNOSIS — N183 Chronic kidney disease, stage 3 (moderate): Secondary | ICD-10-CM | POA: Diagnosis not present

## 2017-05-02 DIAGNOSIS — Z951 Presence of aortocoronary bypass graft: Secondary | ICD-10-CM | POA: Diagnosis not present

## 2017-05-02 DIAGNOSIS — R269 Unspecified abnormalities of gait and mobility: Secondary | ICD-10-CM | POA: Diagnosis not present

## 2017-05-02 DIAGNOSIS — I129 Hypertensive chronic kidney disease with stage 1 through stage 4 chronic kidney disease, or unspecified chronic kidney disease: Secondary | ICD-10-CM | POA: Insufficient documentation

## 2017-05-02 DIAGNOSIS — S76112D Strain of left quadriceps muscle, fascia and tendon, subsequent encounter: Secondary | ICD-10-CM | POA: Diagnosis not present

## 2017-05-02 DIAGNOSIS — E1122 Type 2 diabetes mellitus with diabetic chronic kidney disease: Secondary | ICD-10-CM | POA: Diagnosis not present

## 2017-05-02 DIAGNOSIS — I69398 Other sequelae of cerebral infarction: Secondary | ICD-10-CM | POA: Diagnosis not present

## 2017-05-02 NOTE — Patient Instructions (Signed)
Please call if you'd like to resume therapy  I think you are ready now.

## 2017-05-02 NOTE — Progress Notes (Signed)
Subjective:    [name], female    DOB: [birth year], 84 y.o.   MRN: [id number]  HPI  84 year old female with a right posterior limb internal capsule infarct resulting in left hemiparesis onset in August 2018 who had completed inpatient stroke rehabilitation followed by outpatient rehabilitation when she developed increasing left lateral thigh pain.  Workup including MRI of the femur revealed a vastus lateralis tear.  She is now walking with a walker, she does walking 5 times up and down the hallway after each meal.  No falls. Patient still feels like she is not walking as well since her setback. Pain with WB LLE, Feels ~80% better Pt doesn't feel strong enough to resume therapy at the current time.  Pain Inventory Average Pain 0 Pain Right Now 0 My pain is no pain  In the last 24 hours, has pain interfered with the following? General activity 0 Relation with others 0 Enjoyment of life 0 What TIME of day is your pain at its worst? no pain Sleep (in general) Good  Pain is worse with: no pain Pain improves with: no pain Relief from Meds: no pain  Mobility walk with assistance use a walker  Function retired  Neuro/Psych trouble walking  Prior Studies Any changes since last visit?  no  Physicians involved in your care Any changes since last visit?  no   Family History  Problem Relation Age of Onset  . Diabetes Father 87  . Unexplained death Mother 79  . CAD Neg Hx    Social History   Socioeconomic History  . Marital status: Married    Spouse name: Not on file  . Number of children: 2  . Years of education: Not on file  . Highest education level: Not on file  Occupational History  . Occupation: Retired  Scientific laboratory technician  . Financial resource strain: Not on file  . Food insecurity:    Worry: Not on file    Inability: Not on file  . Transportation needs:    Medical: Not on file    Non-medical: Not on file  Tobacco Use  . Smoking status: Never  Smoker  . Smokeless tobacco: Never Used  Substance and Sexual Activity  . Alcohol use: No  . Drug use: No  . Sexual activity: Not on file  Lifestyle  . Physical activity:    Days per week: Not on file    Minutes per session: Not on file  . Stress: Not on file  Relationships  . Social connections:    Talks on phone: Not on file    Gets together: Not on file    Attends religious service: Not on file    Active member of club or organization: Not on file    Attends meetings of clubs or organizations: Not on file    Relationship status: Not on file  Other Topics Concern  . Not on file  Social History Narrative  . Not on file   Past Surgical History:  Procedure Laterality Date  . CATARACT EXTRACTION W/ INTRAOCULAR LENS  IMPLANT, BILATERAL Bilateral   . CORONARY ANGIOPLASTY WITH STENT PLACEMENT  2004   "LAD & one of the circumflex"  . CORONARY ARTERY BYPASS GRAFT  2011   CABG X4   Past Medical History:  Diagnosis Date  . Acute renal injury ([place]) 07/2016  . Anginal pain ([place]) 2004; 2011  . CAD (coronary artery disease)    status post stenting of the RCA status post  CABG. (left internal mammary artery to  LAD, saphenous vein graft to second diagonal, saphenous vein  graft to obtuse marginal 1, saphenous vein graft to posterior  descending).  . Cardiomyopathy     Mildly reduced EF (45%).   . Chronic kidney disease (CKD), stage III (moderate) ([place])   . History of blood transfusion 1960s   "related to anemia"  . HTN (hypertension)   . Myocardial infarction ([place]) 2011  . Pleural effusion 2011   S/P "bypass"  . Shortness of breath   . Stroke ([place]) 09/19/2016   "left sided weakness" (09/20/2016)  . Type II diabetes mellitus ([facility])    BP (!) 157/69 (BP Location: Right Arm, Patient Position: Sitting, Cuff Size: Normal)   Pulse 64   Resp 14   Ht 5\' 3"  (1.6 m) Comment: patient reported  Wt 145 lb (65.8 kg) Comment: patient reported  SpO2 97%   BMI 25.69 kg/m   Opioid Risk  Score:   Fall Risk Score:  `1  Depression screen PHQ 2/9  Depression screen [facility] 2/9 02/07/2017 11/29/2016 10/18/2016  Decreased Interest 0 0 0  Down, Depressed, Hopeless 0 0 0  PHQ - 2 Score 0 0 0  Altered sleeping - - 0  Tired, decreased energy - - 2  Change in appetite - - 0  Feeling bad or failure about yourself  - - 0  Trouble concentrating - - 0  Moving slowly or fidgety/restless - - 0  Suicidal thoughts - - 0  PHQ-9 Score - - 2  Difficult doing work/chores - - Not difficult at all  Some recent data might be hidden    Review of Systems  Constitutional: Negative.   HENT: Negative.   Eyes: Negative.   Respiratory: Negative.   Cardiovascular: Negative.   Gastrointestinal: Negative.   Endocrine: Negative.   Genitourinary: Negative.   Musculoskeletal: Positive for gait problem.  Skin: Negative.   Allergic/Immunologic: Negative.   Hematological: Negative.   All other systems reviewed and are negative.      Objective:   Physical Exam  Constitutional: She appears well-developed and well-nourished.  Nursing note and vitals reviewed.  No pain with range of motion of the left knee. Left thigh nontender to palpation, there is no swelling in the left thigh Patient goes from sit to stand with supervision does not complain of any pain during standing although she states that if she walks she will have some increased pain.  She does not have her walker with her today however. 4/5 strength in the left hip flexor and extensor ankle dorsiflexor 5/5 in the right hip flexor knee extensor ankle dorsiflexor      Assessment & Plan:  #1.  History of right posterior limb of the capsule infarct approximately 8 months ago.  He has a mild residual left hemiparesis. Overall she has had a good functional recovery but because of her left vastus lateralis tear she has had a decline in her mobility due to pain during ambulation. We discussed that she can resume rehabilitation on an outpatient  setting however she is hesitant to do this until she is relatively pain-free. I will see her back in 3 months.  We discussed that if she wishes to start therapy before that time, she can call the office and we can make the referral.

## 2017-05-15 ENCOUNTER — Encounter: Payer: Self-pay | Admitting: Physical Therapy

## 2017-05-15 NOTE — Therapy (Signed)
[facility] [address] [place], [place], [address] Phone: [phone]   Fax:  [phone]  Patient Details  Name: [name] MRN: [id number] Date of Birth: [birth year] Referring Provider:  No ref. provider found  Encounter Date: 05/15/2017 PHYSICAL THERAPY DISCHARGE SUMMARY  Visits from Start of Care: 6  Current functional level related to goals / functional outcomes: Unknown due to d/c due to medical decline (fall with quadriceps tear)   Remaining deficits: Unknown due to d/c due to medical decline (fall with quadriceps tear)    Education / Equipment: Unknown due to d/c due to medical decline (fall with quadriceps tear)   Plan: Patient agrees to discharge.  Patient goals were not met. Patient is being discharged due to a change in medical status.  ?????       Rexanne Mano, PT 05/15/2017, 8:45 AM  [facility] [address] [place], [place], [address] Phone: [phone]   Fax:  [phone]

## 2017-07-27 ENCOUNTER — Other Ambulatory Visit: Payer: Self-pay | Admitting: Cardiology

## 2017-08-01 ENCOUNTER — Encounter: Payer: Medicare Other | Attending: Physical Medicine & Rehabilitation

## 2017-08-01 ENCOUNTER — Encounter: Payer: Self-pay | Admitting: Physical Medicine & Rehabilitation

## 2017-08-01 ENCOUNTER — Ambulatory Visit: Payer: Medicare Other | Admitting: Physical Medicine & Rehabilitation

## 2017-08-01 VITALS — BP 180/75 | HR 65 | Ht 63.0 in | Wt 136.0 lb

## 2017-08-01 DIAGNOSIS — E1122 Type 2 diabetes mellitus with diabetic chronic kidney disease: Secondary | ICD-10-CM | POA: Diagnosis not present

## 2017-08-01 DIAGNOSIS — I129 Hypertensive chronic kidney disease with stage 1 through stage 4 chronic kidney disease, or unspecified chronic kidney disease: Secondary | ICD-10-CM | POA: Diagnosis not present

## 2017-08-01 DIAGNOSIS — I69354 Hemiplegia and hemiparesis following cerebral infarction affecting left non-dominant side: Secondary | ICD-10-CM | POA: Diagnosis not present

## 2017-08-01 DIAGNOSIS — R269 Unspecified abnormalities of gait and mobility: Secondary | ICD-10-CM

## 2017-08-01 DIAGNOSIS — N183 Chronic kidney disease, stage 3 (moderate): Secondary | ICD-10-CM | POA: Insufficient documentation

## 2017-08-01 DIAGNOSIS — I69398 Other sequelae of cerebral infarction: Secondary | ICD-10-CM | POA: Diagnosis not present

## 2017-08-01 DIAGNOSIS — I2510 Atherosclerotic heart disease of native coronary artery without angina pectoris: Secondary | ICD-10-CM | POA: Diagnosis not present

## 2017-08-01 DIAGNOSIS — I252 Old myocardial infarction: Secondary | ICD-10-CM | POA: Insufficient documentation

## 2017-08-01 DIAGNOSIS — Z951 Presence of aortocoronary bypass graft: Secondary | ICD-10-CM | POA: Insufficient documentation

## 2017-08-01 NOTE — Progress Notes (Signed)
Subjective:    [name], female    DOB: 1927/10/29, 84 y.o.   MRN: [id number] 84 year old female with a right posterior limb internal capsule infarct resulting in left hemiparesis onset in August 2018 who had completed inpatient stroke rehabilitation followed by outpatient rehabilitation when she developed increasing left lateral thigh pain.  Workup including MRI of the femur revealed a vastus lateralis tear. HPI Left thigh pain improved patient no longer complains of tenderness or pain during ambulation. Starting to ambulate with walker with Supervision, can ambulate ~200'.  Was going ~127ft with walker prior to Lateral thigh muscle tear in January Prior to CVA not using walker or cane  Patient has a goal of returning to ambulating without a walker.  We discussed that a cane is certainly an achievable goal however no device may be unsafe for her.  Pain Inventory Average Pain 0 Pain Right Now 0 My pain is no pain  In the last 24 hours, has pain interfered with the following? General activity 0 Relation with others 0 Enjoyment of life 0 What TIME of day is your pain at its worst? no pain Sleep (in general) Good  Pain is worse with: no pain Pain improves with: no pain Relief from Meds: 0  Mobility walk with assistance use a walker  Function retired  Neuro/Psych trouble walking  Prior Studies Any changes since last visit?  no  Physicians involved in your care Any changes since last visit?  no   Family History  Problem Relation Age of Onset  . Diabetes Father 79  . Unexplained death Mother 1  . CAD Neg Hx    Social History   Socioeconomic History  . Marital status: Married    Spouse name: Not on file  . Number of children: 2  . Years of education: Not on file  . Highest education level: Not on file  Occupational History  . Occupation: Retired  Scientific laboratory technician  . Financial resource strain: Not on file  . Food insecurity:    Worry: Not on file      Inability: Not on file  . Transportation needs:    Medical: Not on file    Non-medical: Not on file  Tobacco Use  . Smoking status: Never Smoker  . Smokeless tobacco: Never Used  Substance and Sexual Activity  . Alcohol use: No  . Drug use: No  . Sexual activity: Not on file  Lifestyle  . Physical activity:    Days per week: Not on file    Minutes per session: Not on file  . Stress: Not on file  Relationships  . Social connections:    Talks on phone: Not on file    Gets together: Not on file    Attends religious service: Not on file    Active member of club or organization: Not on file    Attends meetings of clubs or organizations: Not on file    Relationship status: Not on file  Other Topics Concern  . Not on file  Social History Narrative  . Not on file   Past Surgical History:  Procedure Laterality Date  . CATARACT EXTRACTION W/ INTRAOCULAR LENS  IMPLANT, BILATERAL Bilateral   . CORONARY ANGIOPLASTY WITH STENT PLACEMENT  2004   "LAD & one of the circumflex"  . CORONARY ARTERY BYPASS GRAFT  2011   CABG X4   Past Medical History:  Diagnosis Date  . Acute renal injury ([place]) 07/2016  . Anginal pain ([place])  2004; 2011  . CAD (coronary artery disease)    status post stenting of the RCA status post CABG. (left internal mammary artery to  LAD, saphenous vein graft to second diagonal, saphenous vein  graft to obtuse marginal 1, saphenous vein graft to posterior  descending).  . Cardiomyopathy     Mildly reduced EF (45%).   . Chronic kidney disease (CKD), stage III (moderate) ([place])   . History of blood transfusion 1960s   "related to anemia"  . HTN (hypertension)   . Myocardial infarction ([place]) 2011  . Pleural effusion 2011   S/P "bypass"  . Shortness of breath   . Stroke ([place]) 09/19/2016   "left sided weakness" (09/20/2016)  . Type II diabetes mellitus ([facility])    BP (!) 180/75 (BP Location: Right Arm, Patient Position: Sitting, Cuff Size: Normal)   Pulse 65   Ht 5'  3" (1.6 m)   Wt 136 lb (61.7 kg)   SpO2 95%   BMI 24.09 kg/m   Opioid Risk Score:   Fall Risk Score:  `1  Depression screen PHQ 2/9  Depression screen [facility] 2/9 02/07/2017 11/29/2016 10/18/2016  Decreased Interest 0 0 0  Down, Depressed, Hopeless 0 0 0  PHQ - 2 Score 0 0 0  Altered sleeping - - 0  Tired, decreased energy - - 2  Change in appetite - - 0  Feeling bad or failure about yourself  - - 0  Trouble concentrating - - 0  Moving slowly or fidgety/restless - - 0  Suicidal thoughts - - 0  PHQ-9 Score - - 2  Difficult doing work/chores - - Not difficult at all  Some recent data might be hidden    Review of Systems  Constitutional: Negative.   HENT: Negative.   Eyes: Negative.   Respiratory: Negative.   Cardiovascular: Negative.   Gastrointestinal: Negative.   Endocrine: Negative.   Genitourinary: Negative.   Musculoskeletal: Positive for gait problem.  Skin: Negative.   Allergic/Immunologic: Negative.   Hematological: Negative.   Psychiatric/Behavioral: Negative.   All other systems reviewed and are negative.      Objective:   Physical Exam  Constitutional: She is oriented to person, place, and time. She appears well-developed and well-nourished. No distress.  HENT:  Head: Normocephalic and atraumatic.  Eyes: EOM are normal.  Neck: Normal range of motion.  Neurological: She is alert and oriented to person, place, and time. She displays no atrophy. She exhibits normal muscle tone. Coordination and gait abnormal.  Motor strength is 4- in the left deltoid bicep tricep grip left hip flexor knee extensor ankle dorsiflexor 5/5 in the right deltoid, bicep, tricep, grip, hip flexor, knee extensor, ankle plantar and dorsi flexor  Patient ambulates with a walker she has no evidence of toe drag or knee instability.  She does have unsteadiness of gait and slowness with foot placement.      Skin: Skin is warm. She is diaphoretic.  Psychiatric: She has a normal mood and  affect.  Vitals reviewed.   No Tenderness to palpation Left Quad Decreased finger to thumb opposition of the left side compared to the right side Mild dysmetria left finger-nose-finger as well as left heel-to-shin     Assessment & Plan:  1.  Left hemiparesis secondary to right internal capsule and right basal ganglia infarcts.  Patient overall has had a good recovery however her physical therapy has been postponed for a number of months because of a vastus lateralis muscle tear.  This  has now healed and she is ready to resume therapy.  Will ask for home health given her limited mobility. I will see the patient back in approximately 6 weeks

## 2017-08-01 NOTE — Patient Instructions (Signed)
Home health ordered goal is to get to walking with a cane.

## 2017-08-07 ENCOUNTER — Telehealth: Payer: Self-pay

## 2017-08-07 NOTE — Telephone Encounter (Signed)
Dr. Erick Alley, son of pt called requesting that he works with her 4 wks before sending her to outpatient rehab.

## 2017-08-07 NOTE — Telephone Encounter (Signed)
Just to clarify, does the son plan to do the Home health therapy himself or does he wish to have 4 wks of HHPT followed by outpt therapy?

## 2017-08-08 NOTE — Telephone Encounter (Signed)
According to the phone conversation, he wants to do the therapy hisself as the provider, and if she still needs additional care, then to out-patient.

## 2017-08-09 NOTE — Telephone Encounter (Signed)
Left message for son to call back 

## 2017-08-09 NOTE — Telephone Encounter (Signed)
That's fine, will cancel HH, will reassess at f/u

## 2017-08-10 NOTE — Telephone Encounter (Signed)
Son notified 

## 2017-09-05 ENCOUNTER — Ambulatory Visit: Payer: Medicare Other | Admitting: Physical Medicine & Rehabilitation

## 2017-09-22 ENCOUNTER — Other Ambulatory Visit: Payer: Self-pay | Admitting: Cardiology

## 2017-10-09 ENCOUNTER — Ambulatory Visit: Payer: Medicare Other

## 2017-10-09 ENCOUNTER — Encounter: Payer: Medicare Other | Attending: Physical Medicine & Rehabilitation

## 2017-10-09 ENCOUNTER — Encounter: Payer: Self-pay | Admitting: Physical Medicine & Rehabilitation

## 2017-10-09 ENCOUNTER — Ambulatory Visit: Payer: Medicare Other | Admitting: Physical Medicine & Rehabilitation

## 2017-10-09 VITALS — BP 166/79 | HR 63 | Ht 63.0 in | Wt 131.4 lb

## 2017-10-09 DIAGNOSIS — I69398 Other sequelae of cerebral infarction: Secondary | ICD-10-CM

## 2017-10-09 DIAGNOSIS — Z951 Presence of aortocoronary bypass graft: Secondary | ICD-10-CM | POA: Insufficient documentation

## 2017-10-09 DIAGNOSIS — I129 Hypertensive chronic kidney disease with stage 1 through stage 4 chronic kidney disease, or unspecified chronic kidney disease: Secondary | ICD-10-CM | POA: Diagnosis not present

## 2017-10-09 DIAGNOSIS — I252 Old myocardial infarction: Secondary | ICD-10-CM | POA: Diagnosis not present

## 2017-10-09 DIAGNOSIS — R269 Unspecified abnormalities of gait and mobility: Secondary | ICD-10-CM

## 2017-10-09 DIAGNOSIS — I69354 Hemiplegia and hemiparesis following cerebral infarction affecting left non-dominant side: Secondary | ICD-10-CM | POA: Insufficient documentation

## 2017-10-09 DIAGNOSIS — N183 Chronic kidney disease, stage 3 (moderate): Secondary | ICD-10-CM | POA: Insufficient documentation

## 2017-10-09 DIAGNOSIS — E1122 Type 2 diabetes mellitus with diabetic chronic kidney disease: Secondary | ICD-10-CM | POA: Insufficient documentation

## 2017-10-09 DIAGNOSIS — I2510 Atherosclerotic heart disease of native coronary artery without angina pectoris: Secondary | ICD-10-CM | POA: Insufficient documentation

## 2017-10-09 NOTE — Patient Instructions (Signed)
Patient needs to work on hip flexor strength may use ankle weights  Also work on left ankle dorsiflexor strength

## 2017-10-09 NOTE — Progress Notes (Signed)
Subjective:    [name], female    DOB: [birth year], 84 y.o.   MRN: [id number]  HPI 84 year old right-handed female with history of CAD with CABG, diabetes mellitus, CKD stage 3.  Lives with her daughter, independent prior to admission.  Presented September 20, 2016, with left-sided weakness.  Cranial CT scan negative.  The patient did not receive tPA.  MRI of the brain showed 11 mm acute early subacute infarction involving the right posterior limb of internal capsule and lateral thalamus.  MRA with no large vessel occlusion or aneurysm. Echocardiogram with ejection fraction of 60%, grade 1 diastolic dysfunction.  Maintained on Plavix for CVA prophylaxis.   Pt has recovered from vastus lateralis tear.  Son (who is MD) is staying with her for the next few months  Has been wearing bilateral knee orthoses and ambulating with walker outside the home and sometimes without inside the home.   Pain Inventory Average Pain 0 Pain Right Now 0 My pain is na  In the last 24 hours, has pain interfered with the following? General activity 0 Relation with others 0 Enjoyment of life 0 What TIME of day is your pain at its worst? na Sleep (in general) Good  Pain is worse with: na Pain improves with: na Relief from Meds: na  Mobility walk with assistance ability to climb steps?  yes do you drive?  no  Function retired  Neuro/Psych No problems in this area  Prior Studies Any changes since last visit?  no  Physicians involved in your care Any changes since last visit?  no   Family History  Problem Relation Age of Onset  . Diabetes Father 48  . Unexplained death Mother 30  . CAD Neg Hx    Social History   Socioeconomic History  . Marital status: Married    Spouse name: Not on file  . Number of children: 2  . Years of education: Not on file  . Highest education level: Not on file  Occupational History  . Occupation: Retired  Scientific laboratory technician  . Financial  resource strain: Not on file  . Food insecurity:    Worry: Not on file    Inability: Not on file  . Transportation needs:    Medical: Not on file    Non-medical: Not on file  Tobacco Use  . Smoking status: Never Smoker  . Smokeless tobacco: Never Used  Substance and Sexual Activity  . Alcohol use: No  . Drug use: No  . Sexual activity: Not on file  Lifestyle  . Physical activity:    Days per week: Not on file    Minutes per session: Not on file  . Stress: Not on file  Relationships  . Social connections:    Talks on phone: Not on file    Gets together: Not on file    Attends religious service: Not on file    Active member of club or organization: Not on file    Attends meetings of clubs or organizations: Not on file    Relationship status: Not on file  Other Topics Concern  . Not on file  Social History Narrative  . Not on file   Past Surgical History:  Procedure Laterality Date  . CATARACT EXTRACTION W/ INTRAOCULAR LENS  IMPLANT, BILATERAL Bilateral   . CORONARY ANGIOPLASTY WITH STENT PLACEMENT  2004   "LAD & one of the circumflex"  . CORONARY ARTERY BYPASS GRAFT  2011   CABG X4  Past Medical History:  Diagnosis Date  . Acute renal injury ([place]) 07/2016  . Anginal pain ([place]) 2004; 2011  . CAD (coronary artery disease)    status post stenting of the RCA status post CABG. (left internal mammary artery to  LAD, saphenous vein graft to second diagonal, saphenous vein  graft to obtuse marginal 1, saphenous vein graft to posterior  descending).  . Cardiomyopathy     Mildly reduced EF (45%).   . Chronic kidney disease (CKD), stage III (moderate) ([place])   . History of blood transfusion 1960s   "related to anemia"  . HTN (hypertension)   . Myocardial infarction ([place]) 2011  . Pleural effusion 2011   S/P "bypass"  . Shortness of breath   . Stroke ([place]) 09/19/2016   "left sided weakness" (09/20/2016)  . Type II diabetes mellitus ([place])    There were no vitals taken for  this visit.  Opioid Risk Score:   Fall Risk Score:  `1  Depression screen PHQ 2/9  Depression screen [facility] 2/9 02/07/2017 11/29/2016 10/18/2016  Decreased Interest 0 0 0  Down, Depressed, Hopeless 0 0 0  PHQ - 2 Score 0 0 0  Altered sleeping - - 0  Tired, decreased energy - - 2  Change in appetite - - 0  Feeling bad or failure about yourself  - - 0  Trouble concentrating - - 0  Moving slowly or fidgety/restless - - 0  Suicidal thoughts - - 0  PHQ-9 Score - - 2  Difficult doing work/chores - - Not difficult at all  Some recent data might be hidden     Review of Systems  Constitutional: Negative.   HENT: Negative.   Eyes: Negative.   Respiratory: Negative.   Cardiovascular: Negative.   Gastrointestinal: Negative.   Endocrine:       High sugar   Genitourinary: Negative.   Musculoskeletal: Positive for gait problem.  Skin: Negative.   Allergic/Immunologic: Negative.   Hematological: Negative.   Psychiatric/Behavioral: Negative.   All other systems reviewed and are negative.      Objective:   Physical Exam  Constitutional: She appears well-developed and well-nourished.  HENT:  Head: Normocephalic and atraumatic.  Eyes: Pupils are equal, round, and reactive to light. EOM are normal.  Neurological: Gait abnormal.  Tendency towards toe drag on left side , some excess left knee flexion during stance phase  Skin: Skin is warm and dry.  Psychiatric: She has a normal mood and affect.  Nursing note and vitals reviewed.  Motor 5/5 on the RIght side except 4/5 left hip flexion 4+ Left delt bi tri grip 4- Left Hip flex, 5/5 L knee ext, 3+ left ankle DF  Sensation equal in BUE and BLE        Assessment & Plan:  1.  Left hemiparesis due to Right internal capsule infarct.  Overall excellent recovery although she had a 3 mo setback withth eLeft vastus lateralis tear  Cont home exercise program supervised by son, would work on Left ankle DF and Bilateral HF strength

## 2017-10-31 ENCOUNTER — Ambulatory Visit: Payer: Medicare Other | Admitting: Adult Health

## 2017-11-04 ENCOUNTER — Encounter (HOSPITAL_COMMUNITY): Payer: Self-pay

## 2017-11-04 ENCOUNTER — Other Ambulatory Visit: Payer: Self-pay

## 2017-11-04 ENCOUNTER — Inpatient Hospital Stay (HOSPITAL_COMMUNITY)
Admission: EM | Admit: 2017-11-04 | Discharge: 2017-11-24 | DRG: 391 | Disposition: A | Discharge status: Skilled Nursing Facility | Payer: Medicare Other | Attending: Family Medicine | Admitting: Family Medicine

## 2017-11-04 ENCOUNTER — Emergency Department (HOSPITAL_COMMUNITY): Payer: Medicare Other

## 2017-11-04 DIAGNOSIS — R627 Adult failure to thrive: Secondary | ICD-10-CM | POA: Diagnosis present

## 2017-11-04 DIAGNOSIS — Z951 Presence of aortocoronary bypass graft: Secondary | ICD-10-CM

## 2017-11-04 DIAGNOSIS — R109 Unspecified abdominal pain: Secondary | ICD-10-CM | POA: Diagnosis present

## 2017-11-04 DIAGNOSIS — K222 Esophageal obstruction: Secondary | ICD-10-CM | POA: Diagnosis present

## 2017-11-04 DIAGNOSIS — R918 Other nonspecific abnormal finding of lung field: Secondary | ICD-10-CM | POA: Diagnosis not present

## 2017-11-04 DIAGNOSIS — R933 Abnormal findings on diagnostic imaging of other parts of digestive tract: Secondary | ICD-10-CM

## 2017-11-04 DIAGNOSIS — R1030 Lower abdominal pain, unspecified: Secondary | ICD-10-CM

## 2017-11-04 DIAGNOSIS — E861 Hypovolemia: Secondary | ICD-10-CM | POA: Diagnosis present

## 2017-11-04 DIAGNOSIS — R112 Nausea with vomiting, unspecified: Secondary | ICD-10-CM | POA: Diagnosis not present

## 2017-11-04 DIAGNOSIS — I252 Old myocardial infarction: Secondary | ICD-10-CM

## 2017-11-04 DIAGNOSIS — N183 Chronic kidney disease, stage 3 unspecified: Secondary | ICD-10-CM | POA: Diagnosis present

## 2017-11-04 DIAGNOSIS — Z961 Presence of intraocular lens: Secondary | ICD-10-CM | POA: Diagnosis present

## 2017-11-04 DIAGNOSIS — E43 Unspecified severe protein-calorie malnutrition: Secondary | ICD-10-CM | POA: Diagnosis present

## 2017-11-04 DIAGNOSIS — Z9842 Cataract extraction status, left eye: Secondary | ICD-10-CM

## 2017-11-04 DIAGNOSIS — N9489 Other specified conditions associated with female genital organs and menstrual cycle: Secondary | ICD-10-CM

## 2017-11-04 DIAGNOSIS — I5042 Chronic combined systolic (congestive) and diastolic (congestive) heart failure: Secondary | ICD-10-CM | POA: Diagnosis present

## 2017-11-04 DIAGNOSIS — I700 Atherosclerosis of aorta: Secondary | ICD-10-CM | POA: Diagnosis present

## 2017-11-04 DIAGNOSIS — K429 Umbilical hernia without obstruction or gangrene: Secondary | ICD-10-CM | POA: Diagnosis present

## 2017-11-04 DIAGNOSIS — R599 Enlarged lymph nodes, unspecified: Secondary | ICD-10-CM

## 2017-11-04 DIAGNOSIS — Z955 Presence of coronary angioplasty implant and graft: Secondary | ICD-10-CM

## 2017-11-04 DIAGNOSIS — I10 Essential (primary) hypertension: Secondary | ICD-10-CM | POA: Diagnosis present

## 2017-11-04 DIAGNOSIS — I21A1 Myocardial infarction type 2: Secondary | ICD-10-CM | POA: Diagnosis not present

## 2017-11-04 DIAGNOSIS — K220 Achalasia of cardia: Principal | ICD-10-CM | POA: Diagnosis present

## 2017-11-04 DIAGNOSIS — R591 Generalized enlarged lymph nodes: Secondary | ICD-10-CM

## 2017-11-04 DIAGNOSIS — L89152 Pressure ulcer of sacral region, stage 2: Secondary | ICD-10-CM | POA: Diagnosis present

## 2017-11-04 DIAGNOSIS — R0600 Dyspnea, unspecified: Secondary | ICD-10-CM

## 2017-11-04 DIAGNOSIS — J690 Pneumonitis due to inhalation of food and vomit: Secondary | ICD-10-CM | POA: Diagnosis not present

## 2017-11-04 DIAGNOSIS — Z6822 Body mass index (BMI) 22.0-22.9, adult: Secondary | ICD-10-CM

## 2017-11-04 DIAGNOSIS — Z9841 Cataract extraction status, right eye: Secondary | ICD-10-CM

## 2017-11-04 DIAGNOSIS — E118 Type 2 diabetes mellitus with unspecified complications: Secondary | ICD-10-CM | POA: Diagnosis present

## 2017-11-04 DIAGNOSIS — R1114 Bilious vomiting: Secondary | ICD-10-CM

## 2017-11-04 DIAGNOSIS — E785 Hyperlipidemia, unspecified: Secondary | ICD-10-CM | POA: Diagnosis present

## 2017-11-04 DIAGNOSIS — I130 Hypertensive heart and chronic kidney disease with heart failure and stage 1 through stage 4 chronic kidney disease, or unspecified chronic kidney disease: Secondary | ICD-10-CM | POA: Diagnosis present

## 2017-11-04 DIAGNOSIS — I2510 Atherosclerotic heart disease of native coronary artery without angina pectoris: Secondary | ICD-10-CM | POA: Diagnosis present

## 2017-11-04 DIAGNOSIS — Z833 Family history of diabetes mellitus: Secondary | ICD-10-CM

## 2017-11-04 DIAGNOSIS — Z7902 Long term (current) use of antithrombotics/antiplatelets: Secondary | ICD-10-CM

## 2017-11-04 DIAGNOSIS — E860 Dehydration: Secondary | ICD-10-CM | POA: Diagnosis present

## 2017-11-04 DIAGNOSIS — R1033 Periumbilical pain: Secondary | ICD-10-CM

## 2017-11-04 DIAGNOSIS — N179 Acute kidney failure, unspecified: Secondary | ICD-10-CM | POA: Diagnosis present

## 2017-11-04 DIAGNOSIS — D638 Anemia in other chronic diseases classified elsewhere: Secondary | ICD-10-CM | POA: Diagnosis present

## 2017-11-04 DIAGNOSIS — E1122 Type 2 diabetes mellitus with diabetic chronic kidney disease: Secondary | ICD-10-CM | POA: Diagnosis present

## 2017-11-04 DIAGNOSIS — R52 Pain, unspecified: Secondary | ICD-10-CM

## 2017-11-04 DIAGNOSIS — D509 Iron deficiency anemia, unspecified: Secondary | ICD-10-CM | POA: Diagnosis present

## 2017-11-04 DIAGNOSIS — R971 Elevated cancer antigen 125 [CA 125]: Secondary | ICD-10-CM | POA: Diagnosis present

## 2017-11-04 DIAGNOSIS — N200 Calculus of kidney: Secondary | ICD-10-CM | POA: Diagnosis present

## 2017-11-04 DIAGNOSIS — Z7984 Long term (current) use of oral hypoglycemic drugs: Secondary | ICD-10-CM

## 2017-11-04 DIAGNOSIS — L8990 Pressure ulcer of unspecified site, unspecified stage: Secondary | ICD-10-CM

## 2017-11-04 DIAGNOSIS — I429 Cardiomyopathy, unspecified: Secondary | ICD-10-CM | POA: Diagnosis present

## 2017-11-04 DIAGNOSIS — R1310 Dysphagia, unspecified: Secondary | ICD-10-CM

## 2017-11-04 DIAGNOSIS — R634 Abnormal weight loss: Secondary | ICD-10-CM

## 2017-11-04 DIAGNOSIS — K449 Diaphragmatic hernia without obstruction or gangrene: Secondary | ICD-10-CM | POA: Diagnosis present

## 2017-11-04 DIAGNOSIS — E440 Moderate protein-calorie malnutrition: Secondary | ICD-10-CM | POA: Diagnosis present

## 2017-11-04 DIAGNOSIS — Z91041 Radiographic dye allergy status: Secondary | ICD-10-CM

## 2017-11-04 DIAGNOSIS — I69354 Hemiplegia and hemiparesis following cerebral infarction affecting left non-dominant side: Secondary | ICD-10-CM

## 2017-11-04 LAB — COMPREHENSIVE METABOLIC PANEL
ALK PHOS: 70 U/L (ref 38–126)
ALT: 10 U/L (ref 0–44)
ANION GAP: 13 (ref 5–15)
AST: 17 U/L (ref 15–41)
Albumin: 2.9 g/dL — ABNORMAL LOW (ref 3.5–5.0)
BILIRUBIN TOTAL: 0.9 mg/dL (ref 0.3–1.2)
BUN: 14 mg/dL (ref 8–23)
CALCIUM: 10.3 mg/dL (ref 8.9–10.3)
CO2: 27 mmol/L (ref 22–32)
Chloride: 93 mmol/L — ABNORMAL LOW (ref 98–111)
Creatinine, Ser: 1.5 mg/dL — ABNORMAL HIGH (ref 0.44–1.00)
GFR calc non Af Amer: 30 mL/min — ABNORMAL LOW (ref >60)
GFR, EST AFRICAN AMERICAN: 35 mL/min — AB (ref >60)
Glucose, Bld: 356 mg/dL — ABNORMAL HIGH (ref 70–99)
POTASSIUM: 5.1 mmol/L (ref 3.5–5.1)
SODIUM: 133 mmol/L — AB (ref 135–145)
TOTAL PROTEIN: 8 g/dL (ref 6.5–8.1)

## 2017-11-04 LAB — CBC
HCT: 30.2 % — ABNORMAL LOW (ref 36.0–46.0)
HEMOGLOBIN: 9.6 g/dL — AB (ref 12.0–15.0)
MCH: 26.7 pg (ref 26.0–34.0)
MCHC: 31.8 g/dL (ref 30.0–36.0)
MCV: 83.9 fL (ref 78.0–100.0)
Platelets: 484 10*3/uL — ABNORMAL HIGH (ref 150–400)
RBC: 3.6 MIL/uL — AB (ref 3.87–5.11)
RDW: 14.5 % (ref 11.5–15.5)
WBC: 10 10*3/uL (ref 4.0–10.5)

## 2017-11-04 LAB — URINALYSIS, ROUTINE W REFLEX MICROSCOPIC
Bilirubin Urine: NEGATIVE
Glucose, UA: >=500 mg/dL — AB
Ketones, ur: NEGATIVE mg/dL
NITRITE: NEGATIVE
Protein, ur: 100 mg/dL — AB
SPECIFIC GRAVITY, URINE: 1.02 (ref 1.005–1.030)
pH: 7 (ref 5.0–8.0)

## 2017-11-04 LAB — URINALYSIS, MICROSCOPIC (REFLEX): WBC, UA: >50 WBC/hpf (ref 0–5)

## 2017-11-04 LAB — GLUCOSE, CAPILLARY
Glucose-Capillary: 309 mg/dL — ABNORMAL HIGH (ref 70–99)
Glucose-Capillary: 163 mg/dL — ABNORMAL HIGH (ref 70–99)

## 2017-11-04 LAB — LIPASE, BLOOD: Lipase: 33 U/L (ref 11–51)

## 2017-11-04 MED ORDER — INSULIN ASPART 100 UNIT/ML ~~LOC~~ SOLN
0.0000 [IU] | SUBCUTANEOUS | Status: DC
  Administered 2017-11-04: 2 [IU] via SUBCUTANEOUS
  Administered 2017-11-04: 7 [IU] via SUBCUTANEOUS
  Administered 2017-11-05: 2 [IU] via SUBCUTANEOUS
  Administered 2017-11-05: 3 [IU] via SUBCUTANEOUS
  Administered 2017-11-05: 2 [IU] via SUBCUTANEOUS
  Administered 2017-11-05: 3 [IU] via SUBCUTANEOUS
  Administered 2017-11-05: 1 [IU] via SUBCUTANEOUS
  Administered 2017-11-06 (×2): 5 [IU] via SUBCUTANEOUS
  Administered 2017-11-06 (×2): 2 [IU] via SUBCUTANEOUS
  Administered 2017-11-06: 3 [IU] via SUBCUTANEOUS
  Administered 2017-11-07 – 2017-11-08 (×7): 2 [IU] via SUBCUTANEOUS
  Administered 2017-11-08: 1 [IU] via SUBCUTANEOUS
  Administered 2017-11-08: 3 [IU] via SUBCUTANEOUS
  Administered 2017-11-09: 2 [IU] via SUBCUTANEOUS
  Administered 2017-11-09: 3 [IU] via SUBCUTANEOUS
  Administered 2017-11-09 (×3): 1 [IU] via SUBCUTANEOUS
  Administered 2017-11-10: 3 [IU] via SUBCUTANEOUS
  Administered 2017-11-10 – 2017-11-11 (×2): 2 [IU] via SUBCUTANEOUS
  Administered 2017-11-11: 3 [IU] via SUBCUTANEOUS
  Administered 2017-11-11: 1 [IU] via SUBCUTANEOUS
  Administered 2017-11-11: 3 [IU] via SUBCUTANEOUS
  Administered 2017-11-11: 1 [IU] via SUBCUTANEOUS
  Administered 2017-11-11: 2 [IU] via SUBCUTANEOUS
  Administered 2017-11-12: 3 [IU] via SUBCUTANEOUS
  Administered 2017-11-12 (×2): 1 [IU] via SUBCUTANEOUS
  Administered 2017-11-12: 2 [IU] via SUBCUTANEOUS
  Administered 2017-11-13: 3 [IU] via SUBCUTANEOUS
  Administered 2017-11-13 (×2): 1 [IU] via SUBCUTANEOUS
  Administered 2017-11-13 – 2017-11-14 (×2): 2 [IU] via SUBCUTANEOUS
  Administered 2017-11-14 – 2017-11-16 (×6): 1 [IU] via SUBCUTANEOUS
  Administered 2017-11-16: 2 [IU] via SUBCUTANEOUS
  Administered 2017-11-16: 1 [IU] via SUBCUTANEOUS
  Administered 2017-11-17 – 2017-11-18 (×2): 2 [IU] via SUBCUTANEOUS
  Administered 2017-11-19 – 2017-11-21 (×7): 1 [IU] via SUBCUTANEOUS

## 2017-11-04 MED ORDER — ACETAMINOPHEN 325 MG PO TABS
650.0000 mg | ORAL_TABLET | Freq: Four times a day (QID) | ORAL | Status: DC | PRN
  Administered 2017-11-06 – 2017-11-20 (×2): 650 mg via ORAL
  Filled 2017-11-04 (×2): qty 2

## 2017-11-04 MED ORDER — CLONIDINE HCL 0.1 MG PO TABS
0.1000 mg | ORAL_TABLET | Freq: Two times a day (BID) | ORAL | Status: DC
  Administered 2017-11-04 – 2017-11-22 (×35): 0.1 mg via ORAL
  Filled 2017-11-04 (×35): qty 1

## 2017-11-04 MED ORDER — CLOPIDOGREL BISULFATE 75 MG PO TABS
75.0000 mg | ORAL_TABLET | Freq: Every day | ORAL | Status: DC
  Administered 2017-11-10 – 2017-11-14 (×5): 75 mg via ORAL
  Filled 2017-11-04 (×8): qty 1

## 2017-11-04 MED ORDER — ENOXAPARIN SODIUM 30 MG/0.3ML ~~LOC~~ SOLN
30.0000 mg | SUBCUTANEOUS | Status: DC
  Administered 2017-11-04 – 2017-11-06 (×3): 30 mg via SUBCUTANEOUS
  Filled 2017-11-04 (×3): qty 0.3

## 2017-11-04 MED ORDER — ONDANSETRON HCL 4 MG PO TABS: 4.0000 mg | ORAL_TABLET | Freq: Four times a day (QID) | ORAL | Status: DC | PRN

## 2017-11-04 MED ORDER — MORPHINE SULFATE (PF) 2 MG/ML IV SOLN: 1.0000 mg | INTRAVENOUS | Status: DC | PRN

## 2017-11-04 MED ORDER — SODIUM CHLORIDE 0.9 % IV BOLUS
500.0000 mL | Freq: Once | INTRAVENOUS | Status: AC
  Administered 2017-11-04: 500 mL via INTRAVENOUS

## 2017-11-04 MED ORDER — SODIUM CHLORIDE 0.9 % IV SOLN
INTRAVENOUS | Status: AC
  Administered 2017-11-04: 17:00:00 via INTRAVENOUS

## 2017-11-04 MED ORDER — METOPROLOL TARTRATE 25 MG PO TABS
25.0000 mg | ORAL_TABLET | Freq: Two times a day (BID) | ORAL | Status: DC
  Administered 2017-11-04 – 2017-11-11 (×14): 25 mg via ORAL
  Filled 2017-11-04 (×14): qty 1

## 2017-11-04 MED ORDER — HYDROCODONE-ACETAMINOPHEN 5-325 MG PO TABS
1.0000 | ORAL_TABLET | ORAL | Status: DC | PRN
  Filled 2017-11-04: qty 1

## 2017-11-04 MED ORDER — ACETAMINOPHEN 650 MG RE SUPP: 650.0000 mg | Freq: Four times a day (QID) | RECTAL | Status: DC | PRN

## 2017-11-04 MED ORDER — ONDANSETRON HCL 4 MG/2ML IJ SOLN
4.0000 mg | Freq: Four times a day (QID) | INTRAMUSCULAR | Status: DC | PRN
  Administered 2017-11-08 – 2017-11-21 (×7): 4 mg via INTRAVENOUS
  Filled 2017-11-04 (×8): qty 2

## 2017-11-04 MED ORDER — ISOSORBIDE MONONITRATE ER 30 MG PO TB24
30.0000 mg | ORAL_TABLET | Freq: Every day | ORAL | Status: DC
  Administered 2017-11-05 – 2017-11-24 (×19): 30 mg via ORAL
  Filled 2017-11-04 (×20): qty 1

## 2017-11-04 MED ORDER — ATORVASTATIN CALCIUM 40 MG PO TABS
40.0000 mg | ORAL_TABLET | Freq: Every day | ORAL | Status: DC
  Administered 2017-11-04 – 2017-11-24 (×16): 40 mg via ORAL
  Filled 2017-11-04 (×16): qty 1

## 2017-11-04 MED ORDER — AMLODIPINE BESYLATE 10 MG PO TABS
10.0000 mg | ORAL_TABLET | Freq: Every day | ORAL | Status: DC
  Administered 2017-11-05 – 2017-11-11 (×6): 10 mg via ORAL
  Filled 2017-11-04 (×7): qty 1

## 2017-11-04 MED ORDER — SODIUM CHLORIDE 0.9 % IV SOLN
INTRAVENOUS | Status: DC
  Administered 2017-11-04 – 2017-11-08 (×7): via INTRAVENOUS

## 2017-11-04 MED ORDER — POLYETHYLENE GLYCOL 3350 17 G PO PACK: 17.0000 g | PACK | Freq: Every day | ORAL | Status: DC | PRN

## 2017-11-04 NOTE — Progress Notes (Signed)
Pt arrived to room 1509 from ED. Alert and oriented.  Daughter of pt at bedside.

## 2017-11-04 NOTE — ED Notes (Signed)
ED TO INPATIENT HANDOFF REPORT  Name/Age/Gender [name] 84 y.o. female  Code Status Code Status History    Date Active Date Inactive Code Status Order ID Comments User Context   10/13/2016 0410 10/15/2016 2144 Full Code [id number]  Edwin Dada, MD Inpatient   10/06/2016 1657 10/08/2016 1758 Full Code [id number]  Reola Mosher Inpatient   09/22/2016 1831 10/06/2016 1631 Full Code [id number]  Cathlyn Parsons, PA-C Inpatient   09/22/2016 1831 09/22/2016 1831 Full Code [id number]  Elizabeth Sauer Inpatient   09/20/2016 1556 09/22/2016 1810 Full Code [id number]  [name] ED      Home/SNF/Other Home  Chief Complaint emesis;abdominal pain  Level of Care/Admitting Diagnosis ED Disposition    ED Disposition Condition [facility] Area: [facility] [[id number]]  Level of Care: Med-Surg [16]  Diagnosis: Abdominal pain [638466]  Admitting Physician: Toy Baker [[id number]]  Attending Physician: Toy Baker [[id number]]  PT Class (Do Not Modify): Observation [104]  PT Acc Code (Do Not Modify): Observation [10022]       Medical History Past Medical History:  Diagnosis Date  . Acute renal injury ([place]) 07/2016  . Anginal pain ([place]) 2004; 2011  . CAD (coronary artery disease)    status post stenting of the RCA status post CABG. (left internal mammary artery to  LAD, saphenous vein graft to second diagonal, saphenous vein  graft to obtuse marginal 1, saphenous vein graft to posterior  descending).  . Cardiomyopathy     Mildly reduced EF (45%).   . Chronic kidney disease (CKD), stage III (moderate) ([place])   . History of blood transfusion 1960s   "related to anemia"  . HTN (hypertension)   . Myocardial infarction ([place]) 2011  . Pleural effusion 2011   S/P "bypass"  . Shortness of breath   . Stroke ([place]) 09/19/2016   "left sided weakness" (09/20/2016)  . Type II diabetes mellitus ([facility])     Allergies Allergies   Allergen Reactions  . Contrast Media [Iodinated Diagnostic Agents]     Sensitivity, kidney issues    IV Location/Drains/Wounds Patient Lines/Drains/Airways Status   Active Line/Drains/Airways    Name:   Placement date:   Placement time:   Site:   Days:   Peripheral IV 11/04/17 Right Antecubital   11/04/17    1108    Antecubital   less than 1          Labs/Imaging Results for orders placed or performed during the hospital encounter of 11/04/17 (from the past 48 hour(s))  Lipase, blood     Status: None   Collection Time: 11/04/17 11:10 AM  Result Value Ref Range   Lipase 33 11 - 51 U/L    Comment: Performed at [facility], [place] [address], [place], [place] [address]  Comprehensive metabolic panel     Status: Abnormal   Collection Time: 11/04/17 11:10 AM  Result Value Ref Range   Sodium 133 (L) 135 - 145 mmol/L   Potassium 5.1 3.5 - 5.1 mmol/L   Chloride 93 (L) 98 - 111 mmol/L   CO2 27 22 - 32 mmol/L   Glucose, Bld 356 (H) 70 - 99 mg/dL   BUN 14 8 - 23 mg/dL   Creatinine, Ser 1.50 (H) 0.44 - 1.00 mg/dL   Calcium 10.3 8.9 - 10.3 mg/dL   Total Protein 8.0 6.5 - 8.1 g/dL   Albumin 2.9 (L) 3.5 - 5.0 g/dL   AST  17 15 - 41 U/L   ALT 10 0 - 44 U/L   Alkaline Phosphatase 70 38 - 126 U/L   Total Bilirubin 0.9 0.3 - 1.2 mg/dL   GFR calc non Af Amer 30 (L) >60 mL/min   GFR calc Af Amer 35 (L) >60 mL/min    Comment: (NOTE) The eGFR has been calculated using the CKD EPI equation. This calculation has not been validated in all clinical situations. eGFR's persistently <60 mL/min signify possible Chronic Kidney Disease.    Anion gap 13 5 - 15    Comment: Performed at [facility], [place] [address], [place], [place] [address]  CBC     Status: Abnormal   Collection Time: 11/04/17 11:10 AM  Result Value Ref Range   WBC 10.0 4.0 - 10.5 K/uL   RBC 3.60 (L) 3.87 - 5.11 MIL/uL   Hemoglobin 9.6 (L) 12.0 - 15.0 g/dL   HCT 30.2 (L) 36.0 - 46.0 %   MCV  83.9 78.0 - 100.0 fL   MCH 26.7 26.0 - 34.0 pg   MCHC 31.8 30.0 - 36.0 g/dL   RDW 14.5 11.5 - 15.5 %   Platelets 484 (H) 150 - 400 K/uL    Comment: Performed at [facility], [place] [address], [place],  [address]   Ct Abdomen Pelvis Wo Contrast  Result Date: 11/04/2017 CLINICAL DATA:  84 year old female with a history of abdominal pain and weight loss EXAM: CT ABDOMEN AND PELVIS WITHOUT CONTRAST TECHNIQUE: Multidetector CT imaging of the abdomen and pelvis was performed following the standard protocol without IV contrast. COMPARISON:  06/30/2011 FINDINGS: Lower chest: Mixed ground-glass and nodularity of the lateral segment of the right middle lobe, new from the comparison. There are additional foci of the right lower lobe and left lower lobe of mixed ground-glass/nodularity. Coronary artery disease. Differential attenuation of the blood pool and the myocardium. Hiatal hernia. Hepatobiliary: Unremarkable liver.  Unremarkable gallbladder. Pancreas: Unremarkable pancreas Spleen: Unremarkable spleen Adrenals/Urinary Tract: Unremarkable appearance of the adrenal glands. No evidence of hydronephrosis of the right or left kidney. Vascular calcifications of the bilateral renal hila. Unremarkable course of the bilateral ureters. Unremarkable appearance of the urinary bladder. Nonobstructive stone at the inferior collecting system of the left kidney measures 2 mm. Stomach/Bowel: Hiatal hernia. Otherwise unremarkable stomach. Unremarkable small bowel with no abnormal distention. Moderate stool burden without transition point or evidence of obstruction. No inflammatory changes adjacent to the colon. Appendix is not visualized, however, no inflammatory changes are present adjacent to the cecum to indicate an appendicitis. Vascular/Lymphatic: Dense calcifications of the abdominal aorta mesenteric arteries renal arteries and lower extremities. Reproductive: Fibroid uterus. Cystic lesion associated  with the left adnexa measures 2.3 cm. This has enlarged slightly from the comparison CT of 2013. Other: Lymph node within the umbilical fat containing hernia, new from the comparison CT and measures 8 mm. No intra-abdominal lymphadenopathy. No inguinal adenopathy. No pelvic adenopathy. Musculoskeletal: Multilevel degenerative changes of the thoracolumbar spine, with vacuum disc phenomenon extending from L1-S1 and associated broad-based disc bulges. No acute displaced fracture identified. Degenerative changes of the hips. IMPRESSION: No acute CT finding of the abdomen. Evidence of anemia. Of interest, there is a new lymph node within the fat containing umbilical hernia. In the absence of other lymph nodes/lymphadenopathy, this may be reactive, however, a node at this site has been associated with GI/intra-abdominal malignancy (sister Wynona Dove node), and referral for primary care valuation and review of health screening tests such  as colonoscopy recommended. Cystic lesion of the left adnexa measures 2.3 cm. Referral for gynecologic evaluation and consideration of initiating ultrasound surveillance recommended, as this could potentially represent a GYN malignancy in a patient of this age. Hiatal hernia. Nonobstructive left nephrolithiasis. Aortic Atherosclerosis (ICD10-I70.0). Associated coronary artery disease, mesenteric artery disease, renal artery disease, lower extremity arterial disease. Electronically Signed   By: Corrie Mckusick D.O.   On: 11/04/2017 12:17   Dg Chest 2 View  Result Date: 11/04/2017 CLINICAL DATA:  Abnormality shown on previous CT scan today; hx CAD, HTN, pleural effusion; non smoker' diabetic EXAM: CHEST - 2 VIEW COMPARISON:  none FINDINGS: Lungs are clear. Heart size and mediastinal contours are within normal limits. Previous CABG. Aortic Atherosclerosis (ICD10-170.0). No effusion. Visualized bones unremarkable, post median sternotomy. IMPRESSION: No acute cardiopulmonary disease, post  CABG. Electronically Signed   By: Lucrezia Europe M.D.   On: 11/04/2017 13:57    Pending Labs Unresulted Labs (From admission, onward)    Start     Ordered   11/04/17 1050  Urinalysis, Routine w reflex microscopic  STAT,   STAT     11/04/17 1049   Signed and Held  Hemoglobin A1c  Once,   R    Comments:  To assess prior glycemic control    Signed and Held   Signed and Held  Magnesium  Tomorrow morning,   R    Comments:  Call MD if <1.5    Signed and Held   Signed and Held  Phosphorus  Tomorrow morning,   R     Signed and Held   Signed and Held  TSH  Once,   R    Comments:  Cancel if already done within 1 month and notify MD    Signed and Held   Signed and Held  Comprehensive metabolic panel  Once,   R    Comments:  Cal MD for K<3.5 or >5.0    Signed and Held   Signed and Held  CBC  Once,   R    Comments:  Call for hg <8.0    Signed and Held   Signed and Held  Prealbumin  Tomorrow morning,   R     Signed and Held          Vitals/Pain Today's Vitals   11/04/17 1044 11/04/17 1047 11/04/17 1107  BP: 137/68    Pulse: 64    Resp: 16    Temp: 98.5 F (36.9 C)    TempSrc: Oral    SpO2: 99%    Weight:  57.2 kg   Height:  5' 3"  (1.6 m)   PainSc:  5  3     Isolation Precautions No active isolations  Medications Medications  0.9 %  sodium chloride infusion ( Intravenous New Bag/Given 11/04/17 1334)  sodium chloride 0.9 % bolus 500 mL (0 mLs Intravenous Stopped 11/04/17 1330)    Mobility manual wheelchair

## 2017-11-04 NOTE — ED Provider Notes (Signed)
[facility] DEPT Provider Note   CSN: [id number] Arrival date & time: 11/04/17  1014     History   Chief Complaint Chief Complaint  Patient presents with  . Emesis    HPI [name] is a 84 y.o. female.  Patient with history of CABG, on Plavix, stroke, chronic kidney disease --presents the emergency department with her son today with 1 day history of bilious vomiting, periumbilical pain.  In addition, patient has had several months of unexplained weight loss.  No reported chest pain or shortness of breath.  No reported fevers.  No diarrhea, melena, hematochezia.  No changes in urination.  Patient is more confused per her son.  Patient's Plavix has been held due to recent diagnosis of anemia.  GI appointment pending.  The onset of this condition was acute. The course is constant. Aggravating factors: none. Alleviating factors: none. No tx PTA.       Past Medical History:  Diagnosis Date  . Acute renal injury ([place]) 07/2016  . Anginal pain ([place]) 2004; 2011  . CAD (coronary artery disease)    status post stenting of the RCA status post CABG. (left internal mammary artery to  LAD, saphenous vein graft to second diagonal, saphenous vein  graft to obtuse marginal 1, saphenous vein graft to posterior  descending).  . Cardiomyopathy     Mildly reduced EF (45%).   . Chronic kidney disease (CKD), stage III (moderate) ([place])   . History of blood transfusion 1960s   "related to anemia"  . HTN (hypertension)   . Myocardial infarction ([place]) 2011  . Pleural effusion 2011   S/P "bypass"  . Shortness of breath   . Stroke ([place]) 09/19/2016   "left sided weakness" (09/20/2016)  . Type II diabetes mellitus [facility])     Patient Active Problem List   Diagnosis Date Noted  . Quadriceps strain, left, subsequent encounter 05/02/2017  . Rectal bleed 10/13/2016  . Malnutrition of moderate degree 10/13/2016  . Chest pain with moderate risk for cardiac etiology  10/06/2016  . Labile blood pressure   . Stage 3 chronic kidney disease ([place])   . AKI (acute kidney injury) ([place])   . Acute blood loss anemia   . Small vessel disease ([place]) 09/22/2016  . Hemiparesis affecting left side as late effect of cerebrovascular accident (CVA) ([place]) 09/22/2016  . Gait disturbance, post-stroke 09/22/2016  . Stroke (cerebrum) ([place]) 09/20/2016  . Diabetes mellitus with complication ([place])   . Ischemic stroke ([place])   . Status post intraocular lens implant 06/23/2011  . Glaucoma suspect 12/30/2010  . Macular hole 12/30/2010  . Nonproliferative diabetic retinopathy ([place]) 12/30/2010  . Syncope 06/17/2010  . CARDIOMYOPATHY 04/15/2010  . PLEURAL EFFUSION 04/14/2010  . SHORTNESS OF BREATH 04/14/2010  . CORONARY ATHEROSCLEROSIS NATIVE CORONARY ARTERY 01/19/2009  . Hyperlipidemia 01/06/2009  . Diabetes mellitus ([place]) 01/05/2009  . Essential hypertension 01/05/2009  . Coronary atherosclerosis 01/05/2009    Past Surgical History:  Procedure Laterality Date  . CATARACT EXTRACTION W/ INTRAOCULAR LENS  IMPLANT, BILATERAL Bilateral   . CORONARY ANGIOPLASTY WITH STENT PLACEMENT  2004   "LAD & one of the circumflex"  . CORONARY ARTERY BYPASS GRAFT  2011   CABG X4     OB History   None      Home Medications    Prior to Admission medications   Medication Sig Start Date End Date Taking? Authorizing Provider  acetaminophen (TYLENOL) 500 MG tablet Take 1,000 mg by mouth every 6 (  six) hours as needed for headache.    [provider]  amLODipine (NORVASC) 10 MG tablet TAKE ONE TABLET BY MOUTH ONCE DAILY. 07/27/17   Minus Breeding, MD  atorvastatin (LIPITOR) 40 MG tablet Take 1 tablet (40 mg total) by mouth daily. 04/25/17   Garvin Fila, MD  cholecalciferol (VITAMIN D) 400 units TABS tablet Take 1 tablet (400 Units total) by mouth daily. 10/06/16   Angiulli, Lavon Paganini, PA-C  cloNIDine (CATAPRES) 0.1 MG tablet Take 0.1 mg by mouth 2 (two) times daily.    [provider]  glipiZIDE (GLUCOTROL) 10 MG tablet Take 1 tablet (10 mg total) by mouth daily before breakfast. 10/06/16   Angiulli, Lavon Paganini, PA-C  isosorbide mononitrate (IMDUR) 30 MG 24 hr tablet TAKE 1 TABLET BY MOUTH ONCE DAILY 09/22/17   Minus Breeding, MD  metoprolol tartrate (LOPRESSOR) 50 MG tablet Take 50 mg by mouth 2 (two) times daily.    [provider]  nitroGLYCERIN (NITROSTAT) 0.4 MG SL tablet Place 1 tablet (0.4 mg total) under the tongue every 5 (five) minutes x 3 doses as needed for chest pain. 10/08/16   Lyda Jester M, PA-C  polyethylene glycol (MIRALAX / GLYCOLAX) packet Take 17 g by mouth daily. 10/16/16   Regalado, Belkys A, MD  protein supplement shake (PREMIER PROTEIN) LIQD Take 325 mLs (11 oz total) by mouth daily. 10/15/16   Regalado, Belkys A, MD  senna (SENOKOT) 8.6 MG TABS tablet Take 1 tablet (8.6 mg total) by mouth daily. 10/16/16   Regalado, Cassie Freer, MD    Family History Family History  Problem Relation Age of Onset  . Diabetes Father 81  . Unexplained death Mother 36  . CAD Neg Hx     Social History Social History   Tobacco Use  . Smoking status: Never Smoker  . Smokeless tobacco: Never Used  Substance Use Topics  . Alcohol use: No  . Drug use: No     Allergies   Contrast media [iodinated diagnostic agents]   Review of Systems Review of Systems  Constitutional: Positive for unexpected weight change. Negative for fever.  HENT: Negative for rhinorrhea and sore throat.   Eyes: Negative for redness.  Respiratory: Negative for cough.   Cardiovascular: Negative for chest pain.  Gastrointestinal: Positive for abdominal pain, nausea and vomiting. Negative for diarrhea.  Genitourinary: Negative for dysuria and hematuria.  Musculoskeletal: Negative for myalgias.  Skin: Negative for rash.  Neurological: Negative for headaches.     Physical Exam Updated Vital Signs BP 137/68 (BP Location: Left Arm)   Pulse 64   Temp 98.5 F (36.9  C) (Oral)   Resp 16   Ht 5\' 3"  (1.6 m)   Wt 57.2 kg   SpO2 99%   BMI 22.32 kg/m   Physical Exam  Constitutional: She appears well-developed and well-nourished.  HENT:  Head: Normocephalic and atraumatic.  Eyes: Conjunctivae are normal. Right eye exhibits no discharge. Left eye exhibits no discharge.  No signs of icterus.   Neck: Normal range of motion. Neck supple.  Cardiovascular: Normal rate, regular rhythm and normal heart sounds.  Pulmonary/Chest: Effort normal and breath sounds normal.  Abdominal: Soft. Bowel sounds are normal. There is tenderness in the periumbilical area. There is no rebound and no guarding.  Neurological: She is alert.  Skin: Skin is warm and dry.  Psychiatric: She has a normal mood and affect.  Nursing note and vitals reviewed.    ED Treatments / Results  Labs (  all labs ordered are listed, but only abnormal results are displayed) Labs Reviewed  COMPREHENSIVE METABOLIC PANEL - Abnormal; Notable for the following components:      Result Value   Sodium 133 (*)    Chloride 93 (*)    Glucose, Bld 356 (*)    Creatinine, Ser 1.50 (*)    Albumin 2.9 (*)    GFR calc non Af Amer 30 (*)    GFR calc Af Amer 35 (*)    All other components within normal limits  CBC - Abnormal; Notable for the following components:   RBC 3.60 (*)    Hemoglobin 9.6 (*)    HCT 30.2 (*)    Platelets 484 (*)    All other components within normal limits  LIPASE, BLOOD  URINALYSIS, ROUTINE W REFLEX MICROSCOPIC    EKG None  Radiology Ct Abdomen Pelvis Wo Contrast  Result Date: 11/04/2017 CLINICAL DATA:  84 year old female with a history of abdominal pain and weight loss EXAM: CT ABDOMEN AND PELVIS WITHOUT CONTRAST TECHNIQUE: Multidetector CT imaging of the abdomen and pelvis was performed following the standard protocol without IV contrast. COMPARISON:  06/30/2011 FINDINGS: Lower chest: Mixed ground-glass and nodularity of the lateral segment of the right middle lobe, new  from the comparison. There are additional foci of the right lower lobe and left lower lobe of mixed ground-glass/nodularity. Coronary artery disease. Differential attenuation of the blood pool and the myocardium. Hiatal hernia. Hepatobiliary: Unremarkable liver.  Unremarkable gallbladder. Pancreas: Unremarkable pancreas Spleen: Unremarkable spleen Adrenals/Urinary Tract: Unremarkable appearance of the adrenal glands. No evidence of hydronephrosis of the right or left kidney. Vascular calcifications of the bilateral renal hila. Unremarkable course of the bilateral ureters. Unremarkable appearance of the urinary bladder. Nonobstructive stone at the inferior collecting system of the left kidney measures 2 mm. Stomach/Bowel: Hiatal hernia. Otherwise unremarkable stomach. Unremarkable small bowel with no abnormal distention. Moderate stool burden without transition point or evidence of obstruction. No inflammatory changes adjacent to the colon. Appendix is not visualized, however, no inflammatory changes are present adjacent to the cecum to indicate an appendicitis. Vascular/Lymphatic: Dense calcifications of the abdominal aorta mesenteric arteries renal arteries and lower extremities. Reproductive: Fibroid uterus. Cystic lesion associated with the left adnexa measures 2.3 cm. This has enlarged slightly from the comparison CT of 2013. Other: Lymph node within the umbilical fat containing hernia, new from the comparison CT and measures 8 mm. No intra-abdominal lymphadenopathy. No inguinal adenopathy. No pelvic adenopathy. Musculoskeletal: Multilevel degenerative changes of the thoracolumbar spine, with vacuum disc phenomenon extending from L1-S1 and associated broad-based disc bulges. No acute displaced fracture identified. Degenerative changes of the hips. IMPRESSION: No acute CT finding of the abdomen. Evidence of anemia. Of interest, there is a new lymph node within the fat containing umbilical hernia. In the absence  of other lymph nodes/lymphadenopathy, this may be reactive, however, a node at this site has been associated with GI/intra-abdominal malignancy (sister Wynona Dove node), and referral for primary care valuation and review of health screening tests such as colonoscopy recommended. Cystic lesion of the left adnexa measures 2.3 cm. Referral for gynecologic evaluation and consideration of initiating ultrasound surveillance recommended, as this could potentially represent a GYN malignancy in a patient of this age. Hiatal hernia. Nonobstructive left nephrolithiasis. Aortic Atherosclerosis (ICD10-I70.0). Associated coronary artery disease, mesenteric artery disease, renal artery disease, lower extremity arterial disease. Electronically Signed   By: Corrie Mckusick D.O.   On: 11/04/2017 12:17    Procedures Procedures (including critical  care time)  Medications Ordered in ED Medications  sodium chloride 0.9 % bolus 500 mL (500 mLs Intravenous New Bag/Given 11/04/17 1251)     Initial Impression / Assessment and Plan / ED Course  I have reviewed the triage vital signs and the nursing notes.  Pertinent labs & imaging results that were available during my care of the patient were reviewed by me and considered in my medical decision making (see chart for details).     Patient seen and examined. Work-up initiated.    Vital signs reviewed and are as follows: BP 137/68 (BP Location: Left Arm)   Pulse 64   Temp 98.5 F (36.9 C) (Oral)   Resp 16   Ht 5\' 3"  (1.6 m)   Wt 57.2 kg   SpO2 99%   BMI 22.32 kg/m   CT results reviewed with patient and son at bedside. Dr. Jeanell Sparrow present. Will admit for obs for hydration, given abd pain and bilious vomiting.   Spoke with Dr. Roel Cluck who will see.   Final Clinical Impressions(s) / ED Diagnoses   Final diagnoses:  Periumbilical abdominal pain  Bilious vomiting with nausea  Enlarged lymph node  Adnexal mass   Admit for obs.   ED Discharge Orders    None         Carlisle Cater, PA-C 11/04/17 1328    Pattricia Boss, MD 11/04/17 [phone]

## 2017-11-04 NOTE — Progress Notes (Signed)
Pt family request to skip 0000 CBG check and SSI coverage. Pt is sleeping. Will follow up coverage at 0600.  Kizzie Ide, RN

## 2017-11-04 NOTE — ED Triage Notes (Signed)
Information provided by son. Pt has had copious amounts of emesis, along with periumbilical discomfort and cramping. Pt has been acute confused this morning. Pt has had a 50lb weight loss in the past year. 4-5 episodes of emesis in the last 24 hours, bilious in color.

## 2017-11-04 NOTE — H&P (Signed)
[name] EPP:[id number] DOB: 1927-02-15 DOA: 11/04/2017    PCP: Deland Pretty, MD   Outpatient Specialists:   CARDS:  Dr. Percival Spanish  GI Has follow up scheduled NEurology    Dr. Leonie Man   Patient arrived to ER on 11/04/17 at 1014  Patient coming from: home Live  With family    Chief Complaint:  Chief Complaint  Patient presents with  . Emesis    HPI: [name] is a 84 y.o. female with medical history significant of DM 2, CVA left-sided weakness, history of CAD,  hypertension, hyperlipidemia, CKD with creatinine of 2, combined systolic diastolic CHF    Presented with  1 week hx  Vomiting periumbilical pain reports normal BM, has ben loosing weight.  Reports decreased appetite  She has not had much to eat or drink Reports feeling lightheaded when ambulated.  NO fever no chills, no chest pain no SOB Her BG has been running high  Regarding pertinent Chronic problems: History of CVA on Plavix Story of CAD status post CABG History of cardiomyopathy of EF of 45% repeat echogram from 2018 showed improved EF 55-60% with grade 1 diastolic dysfunction  While in ER: CT showed 2.3 mass in LEft adnexa GYN malignancy ?  The following Work up has been ordered so far:  Orders Placed This Encounter  Procedures  . CT ABDOMEN PELVIS WO CONTRAST  . Lipase, blood  . Comprehensive metabolic panel  . CBC  . Urinalysis, Routine w reflex microscopic  . Diet NPO time specified  . Saline Lock IV, Maintain IV access  . Consult to hospitalist    Following Medications were ordered in ER: Medications  sodium chloride 0.9 % bolus 500 mL (500 mLs Intravenous New Bag/Given 11/04/17 1251)    Significant initial  Findings: Abnormal Labs Reviewed  COMPREHENSIVE METABOLIC PANEL - Abnormal; Notable for the following components:      Result Value   Sodium 133 (*)    Chloride 93 (*)    Glucose, Bld 356 (*)    Creatinine, Ser 1.50 (*)    Albumin 2.9 (*)    GFR calc non Af Amer 30 (*)    GFR calc Af Amer 35 (*)    All other components within normal limits  CBC - Abnormal; Notable for the following components:   RBC 3.60 (*)    Hemoglobin 9.6 (*)    HCT 30.2 (*)    Platelets 484 (*)    All other components within normal limits     Na 133 K 5.1  Cr stable,   Lab Results  Component Value Date   CREATININE 1.50 (H) 11/04/2017   CREATININE 2.07 (H) 02/09/2017   CREATININE 2.33 (H) 10/14/2016      WBC  10  HG/HCT   Down  from baseline see below    Component Value Date/Time   HGB 9.6 (L) 11/04/2017 1110   HCT 30.2 (L) 11/04/2017 1110    Troponin (Point of Care Test) No results for input(s): TROPIPOC in the last 72 hours.     UA   ordered    CXR - NON acute  CTabd/pelvis -left adnexal mass  ECG:  Not obtained   ED Triage Vitals  Enc Vitals Group     BP 11/04/17 1044 137/68     Pulse Rate 11/04/17 1044 64     Resp 11/04/17 1044 16     Temp 11/04/17 1044 98.5 F (36.9 C)     Temp Source 11/04/17 1044 Oral  SpO2 11/04/17 1044 99 %     Weight 11/04/17 1047 126 lb (57.2 kg)     Height 11/04/17 1047 5\' 3"  (1.6 m)     Head Circumference --      Peak Flow --      Pain Score 11/04/17 1047 5     Pain Loc --      Pain Edu? --      Excl. in [place]? --   TMAX(24)@       Latest  Blood pressure 137/68, pulse 64, temperature 98.5 F (36.9 C), temperature source Oral, resp. rate 16, height 5\' 3"  (1.6 m), weight 57.2 kg, SpO2 99 %.   Hospitalist was called for admission for dehydration new adnexal mass abnormal chest x-ray   Review of Systems:    Pertinent positives include:  fatigue, weight loss abdominal pain, nausea, vomiting,  Constitutional:  No weight loss, night sweats, Fevers, chills,  HEENT:  No headaches, Difficulty swallowing,Tooth/dental problems,Sore throat,  No sneezing, itching, ear ache, nasal congestion, post nasal drip,  Cardio-vascular:  No chest pain, Orthopnea, PND, anasarca, dizziness, palpitations.no Bilateral lower  extremity swelling  GI:  No heartburn, indigestion,  diarrhea, change in bowel habits, loss of appetite, melena, blood in stool, hematemesis Resp:  no shortness of breath at rest. No dyspnea on exertion, No excess mucus, no productive cough, No non-productive cough, No coughing up of blood.No change in color of mucus.No wheezing. Skin:  no rash or lesions. No jaundice GU:  no dysuria, change in color of urine, no urgency or frequency. No straining to urinate.  No flank pain.  Musculoskeletal:  No joint pain or no joint swelling. No decreased range of motion. No back pain.  Psych:  No change in mood or affect. No depression or anxiety. No memory loss.  Neuro: no localizing neurological complaints, no tingling, no weakness, no double vision, no gait abnormality, no slurred speech, no confusion  All systems reviewed and apart from [place] all are negative  Past Medical History:   Past Medical History:  Diagnosis Date  . Acute renal injury ([place]) 07/2016  . Anginal pain ([place]) 2004; 2011  . CAD (coronary artery disease)    status post stenting of the RCA status post CABG. (left internal mammary artery to  LAD, saphenous vein graft to second diagonal, saphenous vein  graft to obtuse marginal 1, saphenous vein graft to posterior  descending).  . Cardiomyopathy     Mildly reduced EF (45%).   . Chronic kidney disease (CKD), stage III (moderate) ([place])   . History of blood transfusion 1960s   "related to anemia"  . HTN (hypertension)   . Myocardial infarction ([place]) 2011  . Pleural effusion 2011   S/P "bypass"  . Shortness of breath   . Stroke ([place]) 09/19/2016   "left sided weakness" (09/20/2016)  . Type II diabetes mellitus ([place])       Past Surgical History:  Procedure Laterality Date  . CATARACT EXTRACTION W/ INTRAOCULAR LENS  IMPLANT, BILATERAL Bilateral   . CORONARY ANGIOPLASTY WITH STENT PLACEMENT  2004   "LAD & one of the circumflex"  . CORONARY ARTERY BYPASS GRAFT  2011   CABG  X4    Social History:  Ambulatory   walker       reports that she has never smoked. She has never used smokeless tobacco. She reports that she does not drink alcohol or use drugs.     Family History:   Family History  Problem Relation Age  of Onset  . Diabetes Father 23  . Unexplained death Mother 46  . CAD Neg Hx     Allergies: Allergies  Allergen Reactions  . Contrast Media [Iodinated Diagnostic Agents]     Sensitivity, kidney issues     Prior to Admission medications   Medication Sig Start Date End Date Taking? Authorizing Provider  acetaminophen (TYLENOL) 500 MG tablet Take 500 mg by mouth every 6 (six) hours as needed for headache.    Yes [provider]  amLODipine (NORVASC) 10 MG tablet TAKE ONE TABLET BY MOUTH ONCE DAILY. 07/27/17  Yes Minus Breeding, MD  atorvastatin (LIPITOR) 40 MG tablet Take 1 tablet (40 mg total) by mouth daily. 04/25/17  Yes Garvin Fila, MD  cloNIDine (CATAPRES) 0.1 MG tablet Take 0.1 mg by mouth 2 (two) times daily.   Yes [provider]  clopidogrel (PLAVIX) 75 MG tablet Take 75 mg by mouth daily.   Yes [provider]  ferrous sulfate 220 (44 Fe) MG/5ML solution Take 10 mLs by mouth daily. 10/25/17  Yes [provider]  glipiZIDE (GLUCOTROL) 10 MG tablet Take 1 tablet (10 mg total) by mouth daily before breakfast. 10/06/16  Yes Angiulli, Lavon Paganini, PA-C  isosorbide mononitrate (IMDUR) 30 MG 24 hr tablet TAKE 1 TABLET BY MOUTH ONCE DAILY 09/22/17  Yes Minus Breeding, MD  metoprolol tartrate (LOPRESSOR) 50 MG tablet Take 50 mg by mouth 2 (two) times daily.   Yes [provider]  nitroGLYCERIN (NITROSTAT) 0.4 MG SL tablet Place 1 tablet (0.4 mg total) under the tongue every 5 (five) minutes x 3 doses as needed for chest pain. 10/08/16  Yes Lyda Jester M, PA-C  cholecalciferol (VITAMIN D) 400 units TABS tablet Take 1 tablet (400 Units total) by mouth daily. Patient not taking: Reported on 11/04/2017  10/06/16   Angiulli, Lavon Paganini, PA-C  polyethylene glycol (MIRALAX / GLYCOLAX) packet Take 17 g by mouth daily. Patient not taking: Reported on 11/04/2017 10/16/16   Regalado, Jerald Kief A, MD  protein supplement shake (PREMIER PROTEIN) LIQD Take 325 mLs (11 oz total) by mouth daily. Patient not taking: Reported on 11/04/2017 10/15/16   Regalado, Jerald Kief A, MD  senna (SENOKOT) 8.6 MG TABS tablet Take 1 tablet (8.6 mg total) by mouth daily. Patient not taking: Reported on 11/04/2017 10/16/16   Niel Hummer A, MD   Physical Exam: Blood pressure 137/68, pulse 64, temperature 98.5 F (36.9 C), temperature source Oral, resp. rate 16, height 5\' 3"  (1.6 m), weight 57.2 kg, SpO2 99 %. 1. General:  in No Acute distress   Chronically ill -appearing 2. Psychological: Alert and   Oriented 3. Head/ENT:    Dry Mucous Membranes                          Head Non traumatic, neck supple                            Poor Dentition 4. SKIN:   decreased Skin turgor,  Skin clean Dry and intact no rash 5. Heart: Regular rate and rhythm no  Murmur, no Rub or gallop 6. Lungs: Clear to auscultation bilaterally, no wheezes or crackles   7. Abdomen: Soft,  Suprapubic tender,   distended  obese   8. Lower extremities: no clubbing, cyanosis, or edema 9. Neurologically Grossly intact, moving all 4 extremities equally   10. MSK: Normal range of motion  LABS:     Recent Labs  Lab 11/04/17 1110  WBC 10.0  HGB 9.6*  HCT 30.2*  MCV 83.9  PLT 300*   Basic Metabolic Panel: Recent Labs  Lab 11/04/17 1110  NA 133*  K 5.1  CL 93*  CO2 27  GLUCOSE 356*  BUN 14  CREATININE 1.50*  CALCIUM 10.3      Recent Labs  Lab 11/04/17 1110  AST 17  ALT 10  ALKPHOS 70  BILITOT 0.9  PROT 8.0  ALBUMIN 2.9*   Recent Labs  Lab 11/04/17 1110  LIPASE 33   No results for input(s): AMMONIA in the last 168 hours.    HbA1C: No results for input(s): HGBA1C in the last 72 hours. CBG: No results for input(s): GLUCAP in  the last 168 hours.    Urine analysis:    Component Value Date/Time   COLORURINE YELLOW 10/12/2016 2354   APPEARANCEUR HAZY (A) 10/12/2016 2354   LABSPEC 1.013 10/12/2016 2354   PHURINE 5.0 10/12/2016 2354   GLUCOSEU NEGATIVE 10/12/2016 2354   HGBUR NEGATIVE 10/12/2016 2354   BILIRUBINUR NEGATIVE 10/12/2016 2354   KETONESUR NEGATIVE 10/12/2016 2354   PROTEINUR 100 (A) 10/12/2016 2354   UROBILINOGEN 0.2 06/30/2011 1214   NITRITE NEGATIVE 10/12/2016 2354   LEUKOCYTESUR SMALL (A) 10/12/2016 2354     Cultures:    Component Value Date/Time   SDES URINE, RANDOM 09/29/2016 1105   SPECREQUEST NONE 09/29/2016 1105   CULT MULTIPLE SPECIES PRESENT, SUGGEST RECOLLECTION (A) 09/29/2016 1105   REPTSTATUS 09/30/2016 FINAL 09/29/2016 1105     Radiological Exams on Admission: Ct Abdomen Pelvis Wo Contrast  Result Date: 11/04/2017 CLINICAL DATA:  84 year old female with a history of abdominal pain and weight loss EXAM: CT ABDOMEN AND PELVIS WITHOUT CONTRAST TECHNIQUE: Multidetector CT imaging of the abdomen and pelvis was performed following the standard protocol without IV contrast. COMPARISON:  06/30/2011 FINDINGS: Lower chest: Mixed ground-glass and nodularity of the lateral segment of the right middle lobe, new from the comparison. There are additional foci of the right lower lobe and left lower lobe of mixed ground-glass/nodularity. Coronary artery disease. Differential attenuation of the blood pool and the myocardium. Hiatal hernia. Hepatobiliary: Unremarkable liver.  Unremarkable gallbladder. Pancreas: Unremarkable pancreas Spleen: Unremarkable spleen Adrenals/Urinary Tract: Unremarkable appearance of the adrenal glands. No evidence of hydronephrosis of the right or left kidney. Vascular calcifications of the bilateral renal hila. Unremarkable course of the bilateral ureters. Unremarkable appearance of the urinary bladder. Nonobstructive stone at the inferior collecting system of the left kidney  measures 2 mm. Stomach/Bowel: Hiatal hernia. Otherwise unremarkable stomach. Unremarkable small bowel with no abnormal distention. Moderate stool burden without transition point or evidence of obstruction. No inflammatory changes adjacent to the colon. Appendix is not visualized, however, no inflammatory changes are present adjacent to the cecum to indicate an appendicitis. Vascular/Lymphatic: Dense calcifications of the abdominal aorta mesenteric arteries renal arteries and lower extremities. Reproductive: Fibroid uterus. Cystic lesion associated with the left adnexa measures 2.3 cm. This has enlarged slightly from the comparison CT of 2013. Other: Lymph node within the umbilical fat containing hernia, new from the comparison CT and measures 8 mm. No intra-abdominal lymphadenopathy. No inguinal adenopathy. No pelvic adenopathy. Musculoskeletal: Multilevel degenerative changes of the thoracolumbar spine, with vacuum disc phenomenon extending from L1-S1 and associated broad-based disc bulges. No acute displaced fracture identified. Degenerative changes of the hips. IMPRESSION: No acute CT finding of the abdomen. Evidence of anemia. Of interest, there is a new lymph  node within the fat containing umbilical hernia. In the absence of other lymph nodes/lymphadenopathy, this may be reactive, however, a node at this site has been associated with GI/intra-abdominal malignancy (sister Wynona Dove node), and referral for primary care valuation and review of health screening tests such as colonoscopy recommended. Cystic lesion of the left adnexa measures 2.3 cm. Referral for gynecologic evaluation and consideration of initiating ultrasound surveillance recommended, as this could potentially represent a GYN malignancy in a patient of this age. Hiatal hernia. Nonobstructive left nephrolithiasis. Aortic Atherosclerosis (ICD10-I70.0). Associated coronary artery disease, mesenteric artery disease, renal artery disease, lower  extremity arterial disease. Electronically Signed   By: Corrie Mckusick D.O.   On: 11/04/2017 12:17    Chart has been reviewed    Assessment/Plan   84 y.o. female with medical history significant of DM 2, CVA left-sided weakness, history of CAD,  hypertension, hyperlipidemia, CKD with creatinine of 2, combined systolic diastolic CHF     Admitted for dehydration new adnexal mass and abdominal pain with dehydration   Present on Admission: . Dehydration -  - we'll administer IV fluids and recheck orthostatics in the morning  . CORONARY ATHEROSCLEROSIS NATIVE -  - chronic, continue plavix and statin   and beta blocker holding parameters   . Diabetes mellitus with complication (HCC) -  - Order Sensitive  SSI     -  check TSH and HgA1C  - Hold by mouth medications    . Essential hypertension stable continue home medications with holding parameters . Hyperlipidemia stable we will continue statin . Malnutrition of moderate degree -order nutritional consult . Stage 3 chronic kidney disease ([place]) -close to baseline avoid nephrotoxic medications and monitor kidney status . Abdominal pain -pain management given new diagnosis of adnexal mass will need to follow-up with OB/GYN as an outpatient   Other plan as per orders.  DVT prophylaxis:   Lovenox     Code Status:  FULL CODE  as per patient  I had personally discussed CODE STATUS with patient and family  Family Communication:   Family  at  Bedside  plan of care was discussed with     Daughter,  Disposition Plan:      Obs    Level of care       medical floor         Toy Baker 11/04/2017, 6:34 PM    [facility]  Pager [phone]   after 2 AM please page floor coverage PA If 7AM-7PM, please contact the day team taking care of the patient  [email or web address]  Password [email or web address]

## 2017-11-05 DIAGNOSIS — I10 Essential (primary) hypertension: Secondary | ICD-10-CM | POA: Diagnosis not present

## 2017-11-05 DIAGNOSIS — L8990 Pressure ulcer of unspecified site, unspecified stage: Secondary | ICD-10-CM

## 2017-11-05 DIAGNOSIS — E1122 Type 2 diabetes mellitus with diabetic chronic kidney disease: Secondary | ICD-10-CM

## 2017-11-05 DIAGNOSIS — E860 Dehydration: Secondary | ICD-10-CM | POA: Diagnosis not present

## 2017-11-05 DIAGNOSIS — E118 Type 2 diabetes mellitus with unspecified complications: Secondary | ICD-10-CM | POA: Diagnosis not present

## 2017-11-05 DIAGNOSIS — E785 Hyperlipidemia, unspecified: Secondary | ICD-10-CM | POA: Diagnosis not present

## 2017-11-05 LAB — CBC
HCT: 27.9 % — ABNORMAL LOW (ref 36.0–46.0)
HEMOGLOBIN: 9.1 g/dL — AB (ref 12.0–15.0)
MCH: 27.5 pg (ref 26.0–34.0)
MCHC: 32.6 g/dL (ref 30.0–36.0)
MCV: 84.3 fL (ref 78.0–100.0)
Platelets: 456 10*3/uL — ABNORMAL HIGH (ref 150–400)
RBC: 3.31 MIL/uL — ABNORMAL LOW (ref 3.87–5.11)
RDW: 14.8 % (ref 11.5–15.5)
WBC: 7.9 10*3/uL (ref 4.0–10.5)

## 2017-11-05 LAB — COMPREHENSIVE METABOLIC PANEL
ALK PHOS: 59 U/L (ref 38–126)
ALT: 9 U/L (ref 0–44)
ANION GAP: 12 (ref 5–15)
AST: 14 U/L — ABNORMAL LOW (ref 15–41)
Albumin: 2.6 g/dL — ABNORMAL LOW (ref 3.5–5.0)
BUN: 13 mg/dL (ref 8–23)
CALCIUM: 9.8 mg/dL (ref 8.9–10.3)
CO2: 24 mmol/L (ref 22–32)
Chloride: 99 mmol/L (ref 98–111)
Creatinine, Ser: 1.49 mg/dL — ABNORMAL HIGH (ref 0.44–1.00)
GFR calc Af Amer: 35 mL/min — ABNORMAL LOW (ref >60)
GFR calc non Af Amer: 30 mL/min — ABNORMAL LOW (ref >60)
GLUCOSE: 192 mg/dL — AB (ref 70–99)
Potassium: 4.1 mmol/L (ref 3.5–5.1)
SODIUM: 135 mmol/L (ref 135–145)
Total Bilirubin: 1.1 mg/dL (ref 0.3–1.2)
Total Protein: 6.8 g/dL (ref 6.5–8.1)

## 2017-11-05 LAB — GLUCOSE, CAPILLARY
Glucose-Capillary: 181 mg/dL — ABNORMAL HIGH (ref 70–99)
Glucose-Capillary: 145 mg/dL — ABNORMAL HIGH (ref 70–99)
Glucose-Capillary: 206 mg/dL — ABNORMAL HIGH (ref 70–99)
Glucose-Capillary: 230 mg/dL — ABNORMAL HIGH (ref 70–99)
Glucose-Capillary: 201 mg/dL — ABNORMAL HIGH (ref 70–99)

## 2017-11-05 LAB — PREALBUMIN: PREALBUMIN: 6.1 mg/dL — AB (ref 18–38)

## 2017-11-05 LAB — HEMOGLOBIN A1C
Hgb A1c MFr Bld: 9 % — ABNORMAL HIGH (ref 4.8–5.6)
Mean Plasma Glucose: 211.6 mg/dL

## 2017-11-05 LAB — PHOSPHORUS: Phosphorus: 2.5 mg/dL (ref 2.5–4.6)

## 2017-11-05 LAB — MAGNESIUM: MAGNESIUM: 1.8 mg/dL (ref 1.7–2.4)

## 2017-11-05 LAB — TSH: TSH: 0.854 u[IU]/mL (ref 0.350–4.500)

## 2017-11-05 NOTE — Evaluation (Addendum)
Physical Therapy Evaluation Patient Details Name: [name] MRN: [id number] DOB: 27-May-1927 Today's Date: 11/05/2017   History of Present Illness  [name] is a 84 y.o. female with medical history significant of DM 2, CVA left-sided weakness, history of CAD,  hypertension, hyperlipidemia, CKD 3, combined systolic diastolic CHF; admitted with dehydration, wt loss, dysphagia  Clinical Impression  Pt admitted with above diagnosis. Pt currently with functional limitations due to the deficits listed below (see PT Problem List).  Pt reports being independent amb in house with RW and that she was going up and down a flight of stairs without difficulty prior to admission; pt is unable to sit EOB without assist today and with early decubitus per RN indicating she may have been more sedentary than she admits; no family present at time of eval; recommend SNF at this time but will continue to assess for d/c needs while following in acute setting  Pt will benefit from skilled PT to increase their independence and safety with mobility to allow discharge to the venue listed below.       Follow Up Recommendations SNF    Equipment Recommendations  None recommended by PT    Recommendations for Other Services       Precautions / Restrictions Precautions Precautions: Fall      Mobility  Bed Mobility Overal bed mobility: Needs Assistance Bed Mobility: Supine to Sit;Sit to Supine     Supine to sit: Mod assist Sit to supine: Mod assist   General bed mobility comments: multi-modal cues for participation, sequencing task; assist with LEs and trunk in both directions  Transfers                 General transfer comment: refused  Ambulation/Gait                Stairs            Wheelchair Mobility    Modified Rankin (Stroke Patients Only)       Balance Overall balance assessment: Needs assistance Sitting-balance support: Bilateral upper extremity supported;Feet  supported Sitting balance-Leahy Scale: Poor Sitting balance - Comments: pt briefly sits EOB in midline then leans on R side; appears more related to lack of effort vs actual LOB       Standing balance comment: unable to test, pt refused                             Pertinent Vitals/Pain Pain Assessment: No/denies pain(c/o fatigue and being cold, no pain)    Home Living Family/patient expects to be discharged to:: Private residence Living Arrangements: Children Available Help at Discharge: Available PRN/intermittently(son and nephew) Type of Home: House Home Access: Stairs to enter Entrance Stairs-Rails: None Entrance Stairs-Number of Steps: 2 Home Layout: Multi-level Home Equipment: Environmental consultant - 2 wheels;Shower seat;Wheelchair - manual Additional Comments: no family present; pt states she was amb in the house with RW and up/down stairs without difficulty; unsure of accuracy--per RN pt with early decubitus indicating she may not have been as mobile as she states    Prior Function Level of Independence: Independent with assistive device(s);Independent         Comments: amb with RW      Hand Dominance        Extremity/Trunk Assessment   Upper Extremity Assessment Upper Extremity Assessment: Generalized weakness    Lower Extremity Assessment Lower Extremity Assessment: (bil LE AROM grossly WFL; at least 3/5 grossly)  Communication   Communication: No difficulties  Cognition Arousal/Alertness: Awake/alert Behavior During Therapy: WFL for tasks assessed/performed Overall Cognitive Status: No family/caregiver present to determine baseline cognitive functioning Area of Impairment: Following commands                       Following Commands: Follows one step commands inconsistently     Problem Solving: Slow processing;Decreased initiation;Difficulty sequencing;Requires tactile cues;Requires verbal cues        General Comments       Exercises     Assessment/Plan Pt will benefit from continued PT in acute setting d/t deficits below;    PT Assessment    PT Problem List  muscle weakness/decr strength,  decr balance, decr activity tolerance, decr safety awareness       PT Treatment Interventions   balance training, strengthening, functional mobility training, DME instruction, pt and family education   PT Goals (Current goals can be found in the Care Plan section)  Acute Rehab PT Goals PT Goal Formulation: With patient Potential to Achieve Goals: Fair    Frequency  2x   Barriers to discharge        Co-evaluation               AM-PAC PT "6 Clicks" Daily Activity  Outcome Measure Difficulty turning over in bed (including adjusting bedclothes, sheets and blankets)?: Unable Difficulty moving from lying on back to sitting on the side of the bed? : Unable Difficulty sitting down on and standing up from a chair with arms (e.g., wheelchair, bedside commode, etc,.)?: Unable Help needed moving to and from a bed to chair (including a wheelchair)?: A Lot Help needed walking in hospital room?: Total Help needed climbing 3-5 steps with a railing? : Total 6 Click Score: 7    End of Session   Activity Tolerance: Patient limited by fatigue Patient left: with call bell/phone within reach;in bed;with bed alarm set   PT Visit Diagnosis: Muscle weakness (generalized) (M62.81)    Time: 8325-4982 PT Time Calculation (min) (ACUTE ONLY): 20 min   Charges:   PT Evaluation $PT Eval Low Complexity: 1 Low          [name], PT Pager: [phone] 11/05/2017   [name] [place] 11/05/2017, 4:00 PM

## 2017-11-05 NOTE — Progress Notes (Signed)
PROGRESS NOTE    Verley Pariseau  YSA:[id number] DOB: 1927/09/01 DOA: 11/04/2017 PCP: Deland Pretty, MD   Brief Narrative: Ragan Duhon is a 84 y.o. female with medical history significant of DM 2, CVA left-sided weakness, history of CAD, hypertension, hyperlipidemia, CKD 3, combined systolic diastolic CHF   Assessment & Plan:   Active Problems:   Hyperlipidemia   Essential hypertension   CORONARY ATHEROSCLEROSIS NATIVE CORONARY ARTERY   Diabetes mellitus with complication (HCC)   Hemiparesis affecting left side as late effect of cerebrovascular accident (CVA) ([place])   Stage 3 chronic kidney disease (HCC)   Malnutrition of moderate degree   Dehydration   Abdominal pain   Pressure injury of skin   Dysphagia Patient with decreased oral intake secondary to this problem. This is the likely cause of her dehydration as it is significantly decreasing her ability to adequately hydrate herself -SLP eval -Barium swallow  Dehydration Secondary to above. Contributed to kidney injury. -Continue IV fluids  Acute kidney injury Secondary to hypovolemia from above issues. Creatinine back to baseline. -IV fluids as mentioned above  Anemia Baseline hemoglobin of around 10. Stable.  Essential hypertension Slightly hypertensive -Continue Clonidine, amlodipine, Imdur  History of stroke CAD No real deficits. -Continue Plavix  Diabetes mellitus Hemoglobin A1C of 9%. On glipizide as an outpatient. -Continue SSI  Stage 3 CKD Baseline creatinine of 2.3-5.5. Currently below baseline.  Abdominal pain Resolved.  Adnexal cyst Incidental finding. -Outpatient Gyn follow-up  Chronic systolic and diastolic heart failure EF now preserved with residual grade 1 diastolic dysfunction.   DVT prophylaxis: Lovenox Code Status:   Code Status: Full Code Family Communication: Daughter at bedside Disposition Plan: Discharge in 24-48 hours pending swallow workup and PT eval   Consultants:     None  Procedures:   None  Antimicrobials:  None    Subjective: Ate some breakfast. Feels nauseated. Feeling of food getting stuck and trying to regurgitate.  Objective: Vitals:   11/04/17 1500 11/04/17 1700 11/04/17 2012 11/05/17 0443  BP:  (!) 156/75 (!) 158/62 (!) 161/72  Pulse:  82 84 95  Resp: 18 18 17 18   Temp:  98.3 F (36.8 C) 98 F (36.7 C) 98.4 F (36.9 C)  TempSrc:  Oral Oral Oral  SpO2:  100% 100% 100%  Weight:      Height:        Intake/Output Summary (Last 24 hours) at 11/05/2017 1356 Last data filed at 11/04/2017 2232 Gross per 24 hour  Intake 994.43 ml  Output -  Net 994.43 ml   Filed Weights   11/04/17 1047  Weight: 57.2 kg    Examination:  General exam: Appears calm and comfortable. Fidgety Respiratory system: Clear to auscultation. Respiratory effort normal. Cardiovascular system: S1 & S2 heard, RRR. No murmurs, rubs, gallops or clicks. Gastrointestinal system: Abdomen is nondistended, soft and nontender.  Normal bowel sounds heard. Central nervous system: Alert and oriented. No focal neurological deficits. Extremities: No edema. No calf tenderness Skin: No cyanosis. No rashes Psychiatry: Judgement and insight appear normal. Mood & affect appropriate.     Data Reviewed: I have personally reviewed following labs and imaging studies  CBC: Recent Labs  Lab 11/04/17 1110 11/05/17 0514  WBC 10.0 7.9  HGB 9.6* 9.1*  HCT 30.2* 27.9*  MCV 83.9 84.3  PLT 484* 323*   Basic Metabolic Panel: Recent Labs  Lab 11/04/17 1110 11/05/17 0514  NA 133* 135  K 5.1 4.1  CL 93* 99  CO2 27 24  GLUCOSE 356* 192*  BUN 14 13  CREATININE 1.50* 1.49*  CALCIUM 10.3 9.8  MG  --  1.8  PHOS  --  2.5   GFR: Estimated Creatinine Clearance: 21.6 mL/min (A) (by C-G formula based on SCr of 1.49 mg/dL (H)). Liver Function Tests: Recent Labs  Lab 11/04/17 1110 11/05/17 0514  AST 17 14*  ALT 10 9  ALKPHOS 70 59  BILITOT 0.9 1.1  PROT 8.0 6.8   ALBUMIN 2.9* 2.6*   Recent Labs  Lab 11/04/17 1110  LIPASE 33   No results for input(s): AMMONIA in the last 168 hours. Coagulation Profile: No results for input(s): INR, PROTIME in the last 168 hours. Cardiac Enzymes: No results for input(s): CKTOTAL, CKMB, CKMBINDEX, TROPONINI in the last 168 hours. BNP (last 3 results) No results for input(s): PROBNP in the last 8760 hours. HbA1C: Recent Labs    11/04/17 1110  HGBA1C 9.0*   CBG: Recent Labs  Lab 11/04/17 1657 11/04/17 2016 11/05/17 0446 11/05/17 0742 11/05/17 1147  GLUCAP 309* 163* 181* 145* 206*   Lipid Profile: No results for input(s): CHOL, HDL, LDLCALC, TRIG, CHOLHDL, LDLDIRECT in the last 72 hours. Thyroid Function Tests: Recent Labs    11/05/17 0514  TSH 0.854   Anemia Panel: No results for input(s): VITAMINB12, FOLATE, FERRITIN, TIBC, IRON, RETICCTPCT in the last 72 hours. Sepsis Labs: No results for input(s): PROCALCITON, LATICACIDVEN in the last 168 hours.  No results found for this or any previous visit (from the past 240 hour(s)).       Radiology Studies: Ct Abdomen Pelvis Wo Contrast  Result Date: 11/04/2017 CLINICAL DATA:  84 year old female with a history of abdominal pain and weight loss EXAM: CT ABDOMEN AND PELVIS WITHOUT CONTRAST TECHNIQUE: Multidetector CT imaging of the abdomen and pelvis was performed following the standard protocol without IV contrast. COMPARISON:  06/30/2011 FINDINGS: Lower chest: Mixed ground-glass and nodularity of the lateral segment of the right middle lobe, new from the comparison. There are additional foci of the right lower lobe and left lower lobe of mixed ground-glass/nodularity. Coronary artery disease. Differential attenuation of the blood pool and the myocardium. Hiatal hernia. Hepatobiliary: Unremarkable liver.  Unremarkable gallbladder. Pancreas: Unremarkable pancreas Spleen: Unremarkable spleen Adrenals/Urinary Tract: Unremarkable appearance of the adrenal  glands. No evidence of hydronephrosis of the right or left kidney. Vascular calcifications of the bilateral renal hila. Unremarkable course of the bilateral ureters. Unremarkable appearance of the urinary bladder. Nonobstructive stone at the inferior collecting system of the left kidney measures 2 mm. Stomach/Bowel: Hiatal hernia. Otherwise unremarkable stomach. Unremarkable small bowel with no abnormal distention. Moderate stool burden without transition point or evidence of obstruction. No inflammatory changes adjacent to the colon. Appendix is not visualized, however, no inflammatory changes are present adjacent to the cecum to indicate an appendicitis. Vascular/Lymphatic: Dense calcifications of the abdominal aorta mesenteric arteries renal arteries and lower extremities. Reproductive: Fibroid uterus. Cystic lesion associated with the left adnexa measures 2.3 cm. This has enlarged slightly from the comparison CT of 2013. Other: Lymph node within the umbilical fat containing hernia, new from the comparison CT and measures 8 mm. No intra-abdominal lymphadenopathy. No inguinal adenopathy. No pelvic adenopathy. Musculoskeletal: Multilevel degenerative changes of the thoracolumbar spine, with vacuum disc phenomenon extending from L1-S1 and associated broad-based disc bulges. No acute displaced fracture identified. Degenerative changes of the hips. IMPRESSION: No acute CT finding of the abdomen. Evidence of anemia. Of interest, there is a new lymph node within the fat containing umbilical  hernia. In the absence of other lymph nodes/lymphadenopathy, this may be reactive, however, a node at this site has been associated with GI/intra-abdominal malignancy (sister Wynona Dove node), and referral for primary care valuation and review of health screening tests such as colonoscopy recommended. Cystic lesion of the left adnexa measures 2.3 cm. Referral for gynecologic evaluation and consideration of initiating ultrasound  surveillance recommended, as this could potentially represent a GYN malignancy in a patient of this age. Hiatal hernia. Nonobstructive left nephrolithiasis. Aortic Atherosclerosis (ICD10-I70.0). Associated coronary artery disease, mesenteric artery disease, renal artery disease, lower extremity arterial disease. Electronically Signed   By: Corrie Mckusick D.O.   On: 11/04/2017 12:17   Dg Chest 2 View  Result Date: 11/04/2017 CLINICAL DATA:  Abnormality shown on previous CT scan today; hx CAD, HTN, pleural effusion; non smoker' diabetic EXAM: CHEST - 2 VIEW COMPARISON:  none FINDINGS: Lungs are clear. Heart size and mediastinal contours are within normal limits. Previous CABG. Aortic Atherosclerosis (ICD10-170.0). No effusion. Visualized bones unremarkable, post median sternotomy. IMPRESSION: No acute cardiopulmonary disease, post CABG. Electronically Signed   By: Lucrezia Europe M.D.   On: 11/04/2017 13:57        Scheduled Meds: . amLODipine  10 mg Oral Daily  . atorvastatin  40 mg Oral Daily  . cloNIDine  0.1 mg Oral BID  . clopidogrel  75 mg Oral Daily  . enoxaparin (LOVENOX) injection  30 mg Subcutaneous Q24H  . insulin aspart  0-9 Units Subcutaneous Q4H  . isosorbide mononitrate  30 mg Oral Daily  . metoprolol tartrate  25 mg Oral BID   Continuous Infusions: . sodium chloride 100 mL/hr at 11/04/17 1334     LOS: 0 days     Cordelia Poche, MD [facility] 11/05/2017, 1:56 PM  If 7PM-7AM, please contact night-coverage [email or web address] 11/05/2017, 1:56 PM

## 2017-11-05 NOTE — Progress Notes (Signed)
Pt's son is worried about pt taking Clonidine since August of 2018. He thinks this might be r/t to her losing weight, attention disorder, N&V. He would like to MD to consider this.

## 2017-11-05 NOTE — Evaluation (Signed)
Clinical/Bedside Swallow Evaluation Patient Details  Name: [name] MRN: [id number] Date of Birth: Jul 01, 1927  Today's Date: 11/05/2017 Time: SLP Start Time (ACUTE ONLY): 1203 SLP Stop Time (ACUTE ONLY): 1230 SLP Time Calculation (min) (ACUTE ONLY): 27 min  Past Medical History:  Past Medical History:  Diagnosis Date  . Acute renal injury ([place]) 07/2016  . Anginal pain ([place]) 2004; 2011  . CAD (coronary artery disease)    status post stenting of the RCA status post CABG. (left internal mammary artery to  LAD, saphenous vein graft to second diagonal, saphenous vein  graft to obtuse marginal 1, saphenous vein graft to posterior  descending).  . Cardiomyopathy     Mildly reduced EF (45%).   . Chronic kidney disease (CKD), stage III (moderate) ([place])   . History of blood transfusion 1960s   "related to anemia"  . HTN (hypertension)   . Myocardial infarction ([place]) 2011  . Pleural effusion 2011   S/P "bypass"  . Shortness of breath   . Stroke ([place]) 09/19/2016   "left sided weakness" (09/20/2016)  . Type II diabetes mellitus ([place])    Past Surgical History:  Past Surgical History:  Procedure Laterality Date  . CATARACT EXTRACTION W/ INTRAOCULAR LENS  IMPLANT, BILATERAL Bilateral   . CORONARY ANGIOPLASTY WITH STENT PLACEMENT  2004   "LAD & one of the circumflex"  . CORONARY ARTERY BYPASS GRAFT  2011   CABG X4   HPI:  [name] a 84 y.o.femalewith medical history significant of DM 2, CVAleft-sided weakness,history of CAD,hypertension, hyperlipidemia, CKD with creatinine of 2,combined systolic diastolic CHF.  Presented with1 week hxVomiting periumbilical painreports normal BM, has ben loosing weight.  Reports decreased appetite.  She has not had much to eat or drink.  Reports feeling lightheaded when ambulated.  NO fever no chills, no chest pain no SOB.  Most recent chest xray is negative for acute disease process.  Of note, the patient's daughter reported a 50lb weight  loss in the last year.     Assessment / Plan / Recommendation Clinical Impression  Clinical swallowing evaluation was completed using thin liquids, pureed material and dry solids.  Nursing reported intermittent issues taking medication with emesis occuring intermittently with intake.  The patient reported a strong dislike to sweet foods.  The patient complains of material not wanting to go down which is most consistent with an esophageal dysphagia.  Limited oral mechanism exam was completed due to the patient's difficulty following commands.  Lingual and labial range of motion appeared to be adequate.  Strength was unable to be tested.  Volitional cough appeared adequate.  [name] oral and pharyngeal swallow appeared to be functional. The patient was noted to grimace with intake and reported that she did not like the sweet flavor and at times felt like she had trouble getting material to go down.  Swallow trigger appeared timely and hyo-laryngeal movement was appreciated to palpation.  Mastication of dry solids appeared to be mildly slow with mild oral residue seen.  Patient requested liquids following all dry boluses.  No overt s/s of aspiration were seen.  Suspect esophageal dysphagia.  MD please consider Regular Barium Swallow to assess esophageal function.  ST will follow for results of exam and to determine if further ST involvement is warranted.  Suggest that patient continue on current diet.     SLP Visit Diagnosis: Dysphagia, unspecified (R13.10)    Aspiration Risk  Mild aspiration risk    Diet Recommendation   Regular with  thin liquids  Medication Administration: Whole meds with liquid    Other  Recommendations Oral Care Recommendations: Oral care BID   Follow up Recommendations Other (comment)(TBD)      Frequency and Duration min 2x/week  2 weeks           Swallow Study   General Date of Onset: 11/04/17 HPI: [name] a 84 y.o.femalewith medical history  significant of DM 2, CVAleft-sided weakness,history of CAD,hypertension, hyperlipidemia, CKD with creatinine of 2,combined systolic diastolic CHF.  Presented with1 week hxVomiting periumbilical painreports normal BM, has ben loosing weight.  Reports decreased appetite.  She has not had much to eat or drink.  Reports feeling lightheaded when ambulated.  NO fever no chills, no chest pain no SOB.  Most recent chest xray is negative for acute disease process.  Of note, the patient's daughter reported a 50lb weight loss in the last year.   Type of Study: Bedside Swallow Evaluation Previous Swallow Assessment: None noted at [facility]. Diet Prior to this Study: Regular;Thin liquids Temperature Spikes Noted: No Respiratory Status: Room air History of Recent Intubation: No Behavior/Cognition: Alert;Cooperative Oral Cavity Assessment: Within Functional Limits Oral Care Completed by SLP: No Oral Cavity - Dentition: Missing dentition Vision: Functional for self-feeding Self-Feeding Abilities: Able to feed self Patient Positioning: Upright in bed Baseline Vocal Quality: Normal Volitional Cough: Strong Volitional Swallow: Able to elicit    Oral/Motor/Sensory Function Overall Oral Motor/Sensory Function: Other (comment)(Unable to fully assess)   Ice Chips Ice chips: Not tested   Thin Liquid Thin Liquid: Within functional limits Presentation: Cup;Spoon;Straw;Self Fed    Nectar Thick Nectar Thick Liquid: Not tested   Honey Thick Honey Thick Liquid: Not tested   Puree Puree: Within functional limits Presentation: Spoon   Solid     Solid: Within functional limits Presentation: [place], [place], [place] Acute Rehab SLP [phone]  Lamar Sprinkles 11/05/2017,12:45 PM

## 2017-11-06 ENCOUNTER — Observation Stay (HOSPITAL_COMMUNITY): Payer: Medicare Other

## 2017-11-06 DIAGNOSIS — E785 Hyperlipidemia, unspecified: Secondary | ICD-10-CM | POA: Diagnosis not present

## 2017-11-06 DIAGNOSIS — E860 Dehydration: Secondary | ICD-10-CM | POA: Diagnosis not present

## 2017-11-06 DIAGNOSIS — I10 Essential (primary) hypertension: Secondary | ICD-10-CM | POA: Diagnosis not present

## 2017-11-06 DIAGNOSIS — R599 Enlarged lymph nodes, unspecified: Secondary | ICD-10-CM | POA: Diagnosis not present

## 2017-11-06 DIAGNOSIS — R1114 Bilious vomiting: Secondary | ICD-10-CM | POA: Diagnosis not present

## 2017-11-06 DIAGNOSIS — E118 Type 2 diabetes mellitus with unspecified complications: Secondary | ICD-10-CM | POA: Diagnosis not present

## 2017-11-06 LAB — IRON AND TIBC
Iron: 12 ug/dL — ABNORMAL LOW (ref 28–170)
SATURATION RATIOS: 10 % — AB (ref 10.4–31.8)
TIBC: 117 ug/dL — ABNORMAL LOW (ref 250–450)
UIBC: 105 ug/dL

## 2017-11-06 LAB — RETICULOCYTES
RBC.: 3.35 MIL/uL — ABNORMAL LOW (ref 3.87–5.11)
Retic Count, Absolute: 60.3 10*3/uL (ref 19.0–186.0)
Retic Ct Pct: 1.8 % (ref 0.4–3.1)

## 2017-11-06 LAB — FOLATE: FOLATE: 6.2 ng/mL (ref >5.9)

## 2017-11-06 LAB — GLUCOSE, CAPILLARY
GLUCOSE-CAPILLARY: 182 mg/dL — AB (ref 70–99)
GLUCOSE-CAPILLARY: 234 mg/dL — AB (ref 70–99)
Glucose-Capillary: 176 mg/dL — ABNORMAL HIGH (ref 70–99)
Glucose-Capillary: 253 mg/dL — ABNORMAL HIGH (ref 70–99)
Glucose-Capillary: 287 mg/dL — ABNORMAL HIGH (ref 70–99)

## 2017-11-06 LAB — FERRITIN: FERRITIN: 397 ng/mL — AB (ref 11–307)

## 2017-11-06 LAB — VITAMIN B12: Vitamin B-12: 209 pg/mL (ref 180–914)

## 2017-11-06 MED ORDER — PANTOPRAZOLE SODIUM 40 MG PO TBEC
40.0000 mg | DELAYED_RELEASE_TABLET | Freq: Two times a day (BID) | ORAL | Status: DC
  Administered 2017-11-06 – 2017-11-24 (×31): 40 mg via ORAL
  Filled 2017-11-06 (×31): qty 1

## 2017-11-06 MED ORDER — PANTOPRAZOLE SODIUM 40 MG PO TBEC: 40.0000 mg | DELAYED_RELEASE_TABLET | Freq: Every day | ORAL | Status: DC

## 2017-11-06 MED ORDER — ENSURE ENLIVE PO LIQD
237.0000 mL | Freq: Two times a day (BID) | ORAL | Status: DC
  Administered 2017-11-06 – 2017-11-21 (×11): 237 mL via ORAL

## 2017-11-06 NOTE — Care Management Obs Status (Signed)
[place] NOTIFICATION   Patient Details  Name: [name] MRN: [id number] Date of Birth: 09/16/1927   Medicare Observation Status Notification Given:  No Refused to sign.   Leeroy Cha, RN 11/06/2017, 10:15 AM

## 2017-11-06 NOTE — Progress Notes (Signed)
OT Cancellation Note  Patient Details Name: [name] MRN: [id number] DOB: 12-Jun-1927   Cancelled Treatment:    Pt declined OT and ask for OT to return in the morning. Kari Baars, OT Acute Rehabilitation Services [phone] Office- [phone], [name] 11/06/2017, 2:26 PM

## 2017-11-06 NOTE — Progress Notes (Signed)
  Speech Language Pathology Treatment: Dysphagia  Patient Details Name: [name] MRN: [id number] DOB: 09-27-1927 Today's Date: 11/06/2017 Time: 5520-8022 SLP Time Calculation (min) (ACUTE ONLY): 10 min  Assessment / Plan / Recommendation Clinical Impression  Pt seen for education following regular barium swallow. SLP provided written behavioral and dietary strategies for management of esophageal dysmotility, and reviewed these with pt. Pt was given opportunity to ask questions. No further ST intervention is recommended at this time. Please reconsult if needs arise.    HPI HPI: [name] a 84 y.o.femalewith medical history significant of DM 2, CVAleft-sided weakness,history of CAD,hypertension, hyperlipidemia, CKD with creatinine of 2,combined systolic diastolic CHF.  Presented with1 week hxVomiting periumbilical painreports normal BM, has ben loosing weight.  Reports decreased appetite.  She has not had much to eat or drink.  Reports feeling lightheaded when ambulated.  NO fever no chills, no chest pain no SOB.  Most recent chest xray is negative for acute disease process.  Of note, the patient's daughter reported a 50lb weight loss in the last year.        SLP Plan  Education completed. DC ST       Recommendations  Diet recommendations: Regular;Thin liquid Liquids provided via: Cup;Straw Medication Administration: Whole meds with liquid Supervision: Patient able to self feed Compensations: Minimize environmental distractions;Small sips/bites;Slow rate Postural Changes and/or Swallow Maneuvers: Seated upright 90 degrees                Oral Care Recommendations: Oral care BID Follow up Recommendations: None SLP Visit Diagnosis: Dysphagia, unspecified (R13.10) Plan: All goals met       Madiha Bambrick [facility], CCC-SLP Speech Language Pathologist [phone]  Shonna Chock 11/06/2017, 3:26 PM

## 2017-11-06 NOTE — Progress Notes (Signed)
Initial Nutrition Assessment  DOCUMENTATION CODES:   Non-severe (moderate) malnutrition in context of chronic illness  INTERVENTION:   Provide Ensure Enlive po BID, each supplement provides 350 kcal and 20 grams of protein  NUTRITION DIAGNOSIS:   Moderate Malnutrition related to dysphagia(h/o CVA) as evidenced by percent weight loss, energy intake < or equal to 75% for > or equal to 1 month, moderate fat depletion, moderate muscle depletion.  GOAL:   Patient will meet greater than or equal to 90% of their needs  MONITOR:   PO intake, Supplement acceptance, Labs, Weight trends, I & O's, Skin  REASON FOR ASSESSMENT:   Consult Malnutrition Eval  ASSESSMENT:   84 y.o. female with medical history significant of DM 2, CVA left-sided weakness, history of CAD,  hypertension, hyperlipidemia, CKD 3, combined systolic diastolic CHF  Patient in room with daughter at bedside. Pt reports eating well and working with a Physiological scientist until a few months ago. Her appetite started to decrease and foods "just don't taste right". Pt attempts to eat 3 meals a day but eats little of them. Pt ate grits this morning following MBS. PO intakes on 10/6 ranged between 5-25%.  Pt states that she has had swallowing difficulties. SLP evaluated 10/6: mild aspiration risk. Recommended MBS.  Pt is willing to try Ensure supplements.  Per weight records, pt has lost 29 lb since January 2019 (19% wt loss x 9 months, significant for time frame).  Medications reviewed. Labs reviewed: CBGs: 176-182  GFR: 35  NUTRITION - FOCUSED PHYSICAL EXAM:    Most Recent Value  Orbital Region  No depletion  Upper Arm Region  Moderate depletion  Thoracic and Lumbar Region  Unable to assess  Buccal Region  No depletion  Temple Region  Moderate depletion  Clavicle Bone Region  Mild depletion  Clavicle and Acromion Bone Region  No depletion  Scapular Bone Region  Unable to assess  Dorsal Hand  Mild depletion   Patellar Region  Unable to assess  Anterior Thigh Region  Unable to assess  Posterior Calf Region  Unable to assess  Edema (RD Assessment)  None       Diet Order:   Diet Order            Diet Heart Room service appropriate? Yes; Fluid consistency: Thin  Diet effective now              EDUCATION NEEDS:   Education needs have been addressed  Skin:  Skin Assessment: Skin Integrity Issues: Skin Integrity Issues:: Stage II Stage II: sacrum  Last BM:  10/4  Height:   Ht Readings from Last 1 Encounters:  11/04/17 5\' 3"  (1.6 m)    Weight:   Wt Readings from Last 1 Encounters:  11/04/17 57.2 kg    Ideal Body Weight:  52.3 kg  BMI:  Body mass index is 22.32 kg/m.  Estimated Nutritional Needs:   Kcal:  1600-1800  Protein:  70-80g  Fluid:  1.8L/day   Clayton Bibles, MS, RD, LDN [place] Dietitian Pager: [phone] After Hours Pager: [phone]

## 2017-11-06 NOTE — Progress Notes (Signed)
Pt c/o chest pain 9/10, left upper towards shoulder. Pt states pain is off and on, and throbbing in sensation. VSS. On-Call MD notified. PRN Tylenol given. Will continue to monitor pt.  Kizzie Ide, RN

## 2017-11-06 NOTE — Consult Note (Addendum)
Consultation  Referring Provider: Dr. Lonny Prude     Primary Care Physician:  Deland Pretty, MD Primary Gastroenterologist:  Patient says [facility] (Dr. Velora Heckler)       Reason for Consultation: Dysphagia, Weight loss             HPI:   [name] is a 84 y.o. female with a past medical history significant for diabetes type 2, CVA left-sided weakness on Plavix, history of CAD, hypertension, hyperlipidemia, CKD and combined systolic diastolic CHF (EF 89-21% with grade 1 diastolic dysfunction in 1941) who presented to the ER on 11/04/2017 with a complaint of vomiting, we were consulted for dysphagia and weight loss.    Today, the patient is somewhat confused as to details but does tell me that she has had intermittent symptoms of food feeling as it will not go down her throat" comes back out 5 minutes later or so".  She feels like this is worse in the morning time but can have occasional symptoms throughout the day.  Tells me this is been occurring ever since her stroke back in August.  This is made it difficult for her to eat and she does describe some weight loss.  Associated symptoms include some heartburn which started over the past 4 days.  Patient believes she has not had a bowel movement since admission a couple of days ago and typically has a bowel movement every other day.  Per previous physician's notes patient has a one-week history of vomiting and periumbilical pain with normal bowel movements.  She also reported feeling lightheaded when admitted.    Denies fever, chills, anorexia, nausea or symptoms that awaken her from sleep.  While in ER: CT showed 2.3 mass in LEft adnexa GYN malignancy ?  GI history: Colonoscopy "a long long time ago" at the [facility] per patient, we do not have records.  Past Medical History:  Diagnosis Date  . Acute renal injury ([place]) 07/2016  . Anginal pain ([place]) 2004; 2011  . CAD (coronary artery disease)    status post stenting of the RCA status post  CABG. (left internal mammary artery to  LAD, saphenous vein graft to second diagonal, saphenous vein  graft to obtuse marginal 1, saphenous vein graft to posterior  descending).  . Cardiomyopathy     Mildly reduced EF (45%).   . Chronic kidney disease (CKD), stage III (moderate) ([place])   . History of blood transfusion 1960s   "related to anemia"  . HTN (hypertension)   . Myocardial infarction ([place]) 2011  . Pleural effusion 2011   S/P "bypass"  . Shortness of breath   . Stroke ([place]) 09/19/2016   "left sided weakness" (09/20/2016)  . Type II diabetes mellitus ([place])     Past Surgical History:  Procedure Laterality Date  . CATARACT EXTRACTION W/ INTRAOCULAR LENS  IMPLANT, BILATERAL Bilateral   . CORONARY ANGIOPLASTY WITH STENT PLACEMENT  2004   "LAD & one of the circumflex"  . CORONARY ARTERY BYPASS GRAFT  2011   CABG X4    Family History  Problem Relation Age of Onset  . Diabetes Father 45  . Unexplained death Mother 43  . CAD Neg Hx     Social History   Tobacco Use  . Smoking status: Never Smoker  . Smokeless tobacco: Never Used  Substance Use Topics  . Alcohol use: No  . Drug use: No    Prior to Admission medications   Medication Sig Start Date End  Date Taking? Authorizing Provider  acetaminophen (TYLENOL) 500 MG tablet Take 500 mg by mouth every 6 (six) hours as needed for headache.    Yes [provider]  amLODipine (NORVASC) 10 MG tablet TAKE ONE TABLET BY MOUTH ONCE DAILY. 07/27/17  Yes Minus Breeding, MD  atorvastatin (LIPITOR) 40 MG tablet Take 1 tablet (40 mg total) by mouth daily. 04/25/17  Yes Garvin Fila, MD  cloNIDine (CATAPRES) 0.1 MG tablet Take 0.1 mg by mouth 2 (two) times daily.   Yes [provider]  clopidogrel (PLAVIX) 75 MG tablet Take 75 mg by mouth daily.   Yes [provider]  ferrous sulfate 220 (44 Fe) MG/5ML solution Take 10 mLs by mouth daily. 10/25/17  Yes [provider]  glipiZIDE (GLUCOTROL) 10 MG  tablet Take 1 tablet (10 mg total) by mouth daily before breakfast. 10/06/16  Yes Angiulli, Lavon Paganini, PA-C  isosorbide mononitrate (IMDUR) 30 MG 24 hr tablet TAKE 1 TABLET BY MOUTH ONCE DAILY 09/22/17  Yes Minus Breeding, MD  metoprolol tartrate (LOPRESSOR) 50 MG tablet Take 50 mg by mouth 2 (two) times daily.   Yes [provider]  nitroGLYCERIN (NITROSTAT) 0.4 MG SL tablet Place 1 tablet (0.4 mg total) under the tongue every 5 (five) minutes x 3 doses as needed for chest pain. 10/08/16  Yes Lyda Jester M, PA-C  cholecalciferol (VITAMIN D) 400 units TABS tablet Take 1 tablet (400 Units total) by mouth daily. Patient not taking: Reported on 11/04/2017 10/06/16   Angiulli, Lavon Paganini, PA-C  polyethylene glycol (MIRALAX / GLYCOLAX) packet Take 17 g by mouth daily. Patient not taking: Reported on 11/04/2017 10/16/16   Regalado, Jerald Kief A, MD  protein supplement shake (PREMIER PROTEIN) LIQD Take 325 mLs (11 oz total) by mouth daily. Patient not taking: Reported on 11/04/2017 10/15/16   Regalado, Jerald Kief A, MD  senna (SENOKOT) 8.6 MG TABS tablet Take 1 tablet (8.6 mg total) by mouth daily. Patient not taking: Reported on 11/04/2017 10/16/16   Elmarie Shiley, MD    Current Facility-Administered Medications  Medication Dose Route Frequency Provider Last Rate Last Dose  . 0.9 %  sodium chloride infusion   Intravenous Continuous Doutova, Anastassia, MD 100 mL/hr at 11/06/17 1336    . acetaminophen (TYLENOL) tablet 650 mg  650 mg Oral Q6H PRN Toy Baker, MD   650 mg at 11/06/17 0248   Or  . acetaminophen (TYLENOL) suppository 650 mg  650 mg Rectal Q6H PRN Doutova, Anastassia, MD      . amLODipine (NORVASC) tablet 10 mg  10 mg Oral Daily Doutova, Anastassia, MD   10 mg at 11/06/17 1050  . atorvastatin (LIPITOR) tablet 40 mg  40 mg Oral Daily Doutova, Anastassia, MD   40 mg at 11/06/17 1049  . cloNIDine (CATAPRES) tablet 0.1 mg  0.1 mg Oral BID Doutova, Anastassia, MD   0.1 mg at 11/06/17 1049    . clopidogrel (PLAVIX) tablet 75 mg  75 mg Oral Daily Doutova, Anastassia, MD      . enoxaparin (LOVENOX) injection 30 mg  30 mg Subcutaneous Q24H Doutova, Anastassia, MD   30 mg at 11/05/17 2030  . feeding supplement (ENSURE ENLIVE) (ENSURE ENLIVE) liquid 237 mL  237 mL Oral BID BM Mariel Aloe, MD      . HYDROcodone-acetaminophen (NORCO/VICODIN) 5-325 MG per tablet 1-2 tablet  1-2 tablet Oral Q4H PRN Doutova, Anastassia, MD      . insulin aspart (novoLOG) injection 0-9 Units  0-9 Units  Subcutaneous Q4H Toy Baker, MD   3 Units at 11/06/17 1330  . isosorbide mononitrate (IMDUR) 24 hr tablet 30 mg  30 mg Oral Daily Doutova, Anastassia, MD   30 mg at 11/06/17 1049  . metoprolol tartrate (LOPRESSOR) tablet 25 mg  25 mg Oral BID Toy Baker, MD   25 mg at 11/06/17 1049  . morphine 2 MG/ML injection 1-2 mg  1-2 mg Intravenous Q4H PRN Doutova, Anastassia, MD      . ondansetron (ZOFRAN) tablet 4 mg  4 mg Oral Q6H PRN Doutova, Anastassia, MD       Or  . ondansetron (ZOFRAN) injection 4 mg  4 mg Intravenous Q6H PRN Doutova, Anastassia, MD      . polyethylene glycol (MIRALAX / GLYCOLAX) packet 17 g  17 g Oral Daily PRN Toy Baker, MD        Allergies as of 11/04/2017 - Review Complete 11/04/2017  Allergen Reaction Noted  . Contrast media [iodinated diagnostic agents]  11/04/2017     Review of Systems:    Constitutional: No fever or chills Skin: No rash Cardiovascular: No chest pain Respiratory: No SOB  Gastrointestinal: See HPI and otherwise negative Genitourinary: No dysuria  Neurological: +dizziness Musculoskeletal: No new muscle or joint pain Hematologic: No bleeding or bruising Psychiatric: No history of depression or anxiety    Physical Exam:  Vital signs in last 24 hours: Temp:  [98.3 F (36.8 C)-99.1 F (37.3 C)] 98.3 F (36.8 C) (10/07 0443) Pulse Rate:  [79-96] 83 (10/07 1049) Resp:  [17-19] 19 (10/07 0443) BP: (138-163)/(65-75) 163/73 (10/07  1049) SpO2:  [100 %] 100 % (10/07 0443) Last BM Date: 11/03/17(PTA) General:   Pleasant Elderly AA female appears to be chronically ill  in NAD, Well developed, Well nourished, alert and cooperative Head:  Normocephalic and atraumatic. Eyes:   PEERL, EOMI. No icterus. Conjunctiva pink. Ears:  Normal auditory acuity. Neck:  Supple Throat: Oral cavity and pharynx without inflammation, swelling or lesion.  Lungs: Respirations even and unlabored. Lungs clear to auscultation bilaterally.   No wheezes, crackles, or rhonchi.  Heart: Normal S1, S2. No MRG. Regular rate and rhythm. No peripheral edema, cyanosis or pallor.  Abdomen:  Soft, nondistended, nontender. No rebound or guarding. Normal bowel sounds. No appreciable masses or hepatomegaly. Rectal:  Not performed.  Msk:  Symmetrical without gross deformities. Peripheral pulses intact.  Extremities:  Without edema, no deformity or joint abnormality.  Neurologic:  Alert and  oriented x4;  grossly normal neurologically.  Skin:   Dry and intact without significant lesions or rashes. Psychiatric: Demonstrates good judgement and reason without abnormal affect or behaviors.   LAB RESULTS: Recent Labs    11/04/17 1110 11/05/17 0514  WBC 10.0 7.9  HGB 9.6* 9.1*  HCT 30.2* 27.9*  PLT 484* 456*   BMET Recent Labs    11/04/17 1110 11/05/17 0514  NA 133* 135  K 5.1 4.1  CL 93* 99  CO2 27 24  GLUCOSE 356* 192*  BUN 14 13  CREATININE 1.50* 1.49*  CALCIUM 10.3 9.8   LFT Recent Labs    11/05/17 0514  PROT 6.8  ALBUMIN 2.6*  AST 14*  ALT 9  ALKPHOS 59  BILITOT 1.1   STUDIES: Dg Chest 2 View  Result Date: 11/04/2017 CLINICAL DATA:  Abnormality shown on previous CT scan today; hx CAD, HTN, pleural effusion; non smoker' diabetic EXAM: CHEST - 2 VIEW COMPARISON:  none FINDINGS: Lungs are clear. Heart size and mediastinal contours are  within normal limits. Previous CABG. Aortic Atherosclerosis (ICD10-170.0). No effusion. Visualized  bones unremarkable, post median sternotomy. IMPRESSION: No acute cardiopulmonary disease, post CABG. Electronically Signed   By: Lucrezia Europe M.D.   On: 11/04/2017 13:57   Dg Esophagus  Result Date: 11/06/2017 CLINICAL DATA:  Dysphagia.  Nausea vomiting EXAM: ESOPHOGRAM/BARIUM SWALLOW TECHNIQUE: Single contrast examination was performed using  thin barium. FLUOROSCOPY TIME:  Fluoroscopy Time:  1 minutes 48 seconds Radiation Exposure Index (if provided by the fluoroscopic device): Number of Acquired Spot Images: 0 COMPARISON:  CT abdomen pelvis 11/04/2017 FINDINGS: Poor esophageal motility. Esophagus diffusely dilated with poor motility. No stricture or mass. PET Moderately large hiatal hernia. Sliding hiatal hernia. Grossly normal stomach. Barium tablet not administered due to poor motility and retained barium throughout the esophagus. In addition, the patient was not able to stand upright. IMPRESSION: Moderately large hiatal hernia with poor esophageal motility. Negative for stricture or mass. Electronically Signed   By: Franchot Gallo M.D.   On: 11/06/2017 10:09   EXAM: CT ABDOMEN AND PELVIS WITHOUT CONTRAST  TECHNIQUE: Multidetector CT imaging of the abdomen and pelvis was performed following the standard protocol without IV contrast.  COMPARISON:  06/30/2011  FINDINGS: Lower chest: Mixed ground-glass and nodularity of the lateral segment of the right middle lobe, new from the comparison. There are additional foci of the right lower lobe and left lower lobe of mixed ground-glass/nodularity. Coronary artery disease. Differential attenuation of the blood pool and the myocardium. Hiatal hernia.  Hepatobiliary: Unremarkable liver.  Unremarkable gallbladder.  Pancreas: Unremarkable pancreas  Spleen: Unremarkable spleen  Adrenals/Urinary Tract: Unremarkable appearance of the adrenal glands. No evidence of hydronephrosis of the right or left kidney. Vascular calcifications of the  bilateral renal hila. Unremarkable course of the bilateral ureters. Unremarkable appearance of the urinary bladder. Nonobstructive stone at the inferior collecting system of the left kidney measures 2 mm.  Stomach/Bowel: Hiatal hernia. Otherwise unremarkable stomach. Unremarkable small bowel with no abnormal distention. Moderate stool burden without transition point or evidence of obstruction. No inflammatory changes adjacent to the colon. Appendix is not visualized, however, no inflammatory changes are present adjacent to the cecum to indicate an appendicitis.  Vascular/Lymphatic: Dense calcifications of the abdominal aorta mesenteric arteries renal arteries and lower extremities.  Reproductive: Fibroid uterus. Cystic lesion associated with the left adnexa measures 2.3 cm. This has enlarged slightly from the comparison CT of 2013.  Other: Lymph node within the umbilical fat containing hernia, new from the comparison CT and measures 8 mm.  No intra-abdominal lymphadenopathy. No inguinal adenopathy. No pelvic adenopathy.  Musculoskeletal: Multilevel degenerative changes of the thoracolumbar spine, with vacuum disc phenomenon extending from L1-S1 and associated broad-based disc bulges. No acute displaced fracture identified. Degenerative changes of the hips.  IMPRESSION: No acute CT finding of the abdomen.  Evidence of anemia.  Of interest, there is a new lymph node within the fat containing umbilical hernia. In the absence of other lymph nodes/lymphadenopathy, this may be reactive, however, a node at this site has been associated with GI/intra-abdominal malignancy (sister Wynona Dove node), and referral for primary care valuation and review of health screening tests such as colonoscopy recommended.  Cystic lesion of the left adnexa measures 2.3 cm. Referral for gynecologic evaluation and consideration of initiating ultrasound surveillance recommended, as this  could potentially represent a GYN malignancy in a patient of this age.  Hiatal hernia.  Nonobstructive left nephrolithiasis.  Aortic Atherosclerosis (ICD10-I70.0). Associated coronary artery disease, mesenteric artery disease, renal artery  disease, lower extremity arterial disease.   Electronically Signed   By: Corrie Mckusick D.O.   On: 11/04/2017 12:17    Impression / Plan:   Impression: 1.  Dysphagia: Patient describes food not making it down her throat and regurgitation which happens intermittently over the past year or so since stroke, recent swallow study does show dysmotility and large hiatal hernia which is likely contributing  2.  Coronary atherosclerosis: On Plavix 3.  Malnutrition 4.  Stage III chronic kidney disease 5.  Abdominal pain: Originally described periumbilical pain, now with some heartburn, CT showing lymph node as above recommending GYN follow-up for possible malignancy  Plan: 1.  Will discuss case with Dr. Tarri Glenn. 2.  Continue supportive measures 3.  Added Pantoprazole 40mg  po qd for heartburn 4.  Please await any further recommendations later today  Thank you for your kind consultation, we will continue to follow.  Lavone Nian Kalin Kyler  11/06/2017, 1:47 PM

## 2017-11-06 NOTE — Progress Notes (Signed)
PROGRESS NOTE    [name]  VOH:[id number] DOB: Aug 04, 1927 DOA: 11/04/2017 PCP: Deland Pretty, MD   Brief Narrative: [name] is a 84 y.o. female with medical history significant of DM 2, CVA left-sided weakness, history of CAD, hypertension, hyperlipidemia, CKD 3, combined systolic diastolic CHF   Assessment & Plan:   Active Problems:   Hyperlipidemia   Essential hypertension   CORONARY ATHEROSCLEROSIS NATIVE CORONARY ARTERY   Diabetes mellitus with complication (HCC)   Hemiparesis affecting left side as late effect of cerebrovascular accident (CVA) ([place])   Stage 3 chronic kidney disease (HCC)   Malnutrition of moderate degree   Dehydration   Abdominal pain   Pressure injury of skin   Dysphagia Patient with decreased oral intake secondary to this problem. This is the likely cause of her dehydration as it is significantly decreasing her ability to adequately hydrate herself. Esophagram significant for poor esophageal motility and moderately large hiatal hernia. -GI consult  Dehydration Secondary to above. Contributed to kidney injury. -Continue IV fluids  Acute kidney injury Secondary to hypovolemia from above issues. Creatinine back to baseline. -IV fluids as mentioned above  Anemia Baseline hemoglobin of around 10. Stable.  Essential hypertension Slightly hypertensive -Continue Clonidine, amlodipine, Imdur  History of stroke CAD No real deficits. -Continue Plavix  Diabetes mellitus Hemoglobin A1C of 9%. On glipizide as an outpatient. -Continue SSI  Stage 3 CKD Baseline creatinine of 2.3-5.5. Currently below baseline.  Abdominal pain Resolved.  Adnexal cyst Incidental finding. -Outpatient Gyn follow-up  Chronic systolic and diastolic heart failure EF now preserved with residual grade 1 diastolic dysfunction.  Pressure ulcer Stage II sacral   DVT prophylaxis: Lovenox Code Status:   Code Status: Full Code Family Communication: Daughter  at bedside Disposition Plan: Discharge in 24 hours pending GI recommendations   Consultants:   None  Procedures:   None  Antimicrobials:  None    Subjective: Ate a little bit of breakfast today. No issues overnight.  Objective: Vitals:   11/05/17 1957 11/06/17 0245 11/06/17 0443 11/06/17 1049  BP: (!) 159/73 138/75 140/68 (!) 163/73  Pulse: 81 96 84 83  Resp: 17  19   Temp: 98.7 F (37.1 C)  98.3 F (36.8 C)   TempSrc: Oral  Oral   SpO2: 100%  100%   Weight:      Height:        Intake/Output Summary (Last 24 hours) at 11/06/2017 1233 Last data filed at 11/06/2017 0444 Gross per 24 hour  Intake 2226.66 ml  Output 950 ml  Net 1276.66 ml   Filed Weights   11/04/17 1047  Weight: 57.2 kg    Examination:  General exam: Appears calm and comfortable Respiratory system: Clear to auscultation. Respiratory effort normal. Cardiovascular system: S1 & S2 heard, RRR. Gastrointestinal system: Abdomen is nondistended, soft and nontender. Normal bowel sounds heard. Central nervous system: Alert and oriented. No focal neurological deficits. Extremities: No edema. No calf tenderness Skin: No cyanosis. No rashes Psychiatry: Judgement and insight appear normal. Mood & affect appropriate.     Data Reviewed: I have personally reviewed following labs and imaging studies  CBC: Recent Labs  Lab 11/04/17 1110 11/05/17 0514  WBC 10.0 7.9  HGB 9.6* 9.1*  HCT 30.2* 27.9*  MCV 83.9 84.3  PLT 484* 694*   Basic Metabolic Panel: Recent Labs  Lab 11/04/17 1110 11/05/17 0514  NA 133* 135  K 5.1 4.1  CL 93* 99  CO2 27 24  GLUCOSE 356* 192*  BUN 14 13  CREATININE 1.50* 1.49*  CALCIUM 10.3 9.8  MG  --  1.8  PHOS  --  2.5   GFR: Estimated Creatinine Clearance: 21.6 mL/min (A) (by C-G formula based on SCr of 1.49 mg/dL (H)). Liver Function Tests: Recent Labs  Lab 11/04/17 1110 11/05/17 0514  AST 17 14*  ALT 10 9  ALKPHOS 70 59  BILITOT 0.9 1.1  PROT 8.0 6.8    ALBUMIN 2.9* 2.6*   Recent Labs  Lab 11/04/17 1110  LIPASE 33   No results for input(s): AMMONIA in the last 168 hours. Coagulation Profile: No results for input(s): INR, PROTIME in the last 168 hours. Cardiac Enzymes: No results for input(s): CKTOTAL, CKMB, CKMBINDEX, TROPONINI in the last 168 hours. BNP (last 3 results) No results for input(s): PROBNP in the last 8760 hours. HbA1C: Recent Labs    11/04/17 1110  HGBA1C 9.0*   CBG: Recent Labs  Lab 11/05/17 1147 11/05/17 1638 11/05/17 1953 11/06/17 0447 11/06/17 0732  GLUCAP 206* 230* 201* 182* 176*   Lipid Profile: No results for input(s): CHOL, HDL, LDLCALC, TRIG, CHOLHDL, LDLDIRECT in the last 72 hours. Thyroid Function Tests: Recent Labs    11/05/17 0514  TSH 0.854   Anemia Panel: No results for input(s): VITAMINB12, FOLATE, FERRITIN, TIBC, IRON, RETICCTPCT in the last 72 hours. Sepsis Labs: No results for input(s): PROCALCITON, LATICACIDVEN in the last 168 hours.  No results found for this or any previous visit (from the past 240 hour(s)).       Radiology Studies: Dg Chest 2 View  Result Date: 11/04/2017 CLINICAL DATA:  Abnormality shown on previous CT scan today; hx CAD, HTN, pleural effusion; non smoker' diabetic EXAM: CHEST - 2 VIEW COMPARISON:  none FINDINGS: Lungs are clear. Heart size and mediastinal contours are within normal limits. Previous CABG. Aortic Atherosclerosis (ICD10-170.0). No effusion. Visualized bones unremarkable, post median sternotomy. IMPRESSION: No acute cardiopulmonary disease, post CABG. Electronically Signed   By: Lucrezia Europe M.D.   On: 11/04/2017 13:57   Dg Esophagus  Result Date: 11/06/2017 CLINICAL DATA:  Dysphagia.  Nausea vomiting EXAM: ESOPHOGRAM/BARIUM SWALLOW TECHNIQUE: Single contrast examination was performed using  thin barium. FLUOROSCOPY TIME:  Fluoroscopy Time:  1 minutes 48 seconds Radiation Exposure Index (if provided by the fluoroscopic device): Number of  Acquired Spot Images: 0 COMPARISON:  CT abdomen pelvis 11/04/2017 FINDINGS: Poor esophageal motility. Esophagus diffusely dilated with poor motility. No stricture or mass. PET Moderately large hiatal hernia. Sliding hiatal hernia. Grossly normal stomach. Barium tablet not administered due to poor motility and retained barium throughout the esophagus. In addition, the patient was not able to stand upright. IMPRESSION: Moderately large hiatal hernia with poor esophageal motility. Negative for stricture or mass. Electronically Signed   By: Franchot Gallo M.D.   On: 11/06/2017 10:09        Scheduled Meds: . amLODipine  10 mg Oral Daily  . atorvastatin  40 mg Oral Daily  . cloNIDine  0.1 mg Oral BID  . clopidogrel  75 mg Oral Daily  . enoxaparin (LOVENOX) injection  30 mg Subcutaneous Q24H  . feeding supplement (ENSURE ENLIVE)  237 mL Oral BID BM  . insulin aspart  0-9 Units Subcutaneous Q4H  . isosorbide mononitrate  30 mg Oral Daily  . metoprolol tartrate  25 mg Oral BID   Continuous Infusions: . sodium chloride 100 mL/hr at 11/06/17 0257     LOS: 0 days     Cordelia Poche, MD [facility] 11/06/2017, 12:33 PM  If 7PM-7AM, please contact night-coverage [email or web address] 11/06/2017, 12:33 PM

## 2017-11-07 ENCOUNTER — Observation Stay (HOSPITAL_COMMUNITY): Payer: Medicare Other

## 2017-11-07 DIAGNOSIS — R1310 Dysphagia, unspecified: Secondary | ICD-10-CM

## 2017-11-07 DIAGNOSIS — R1114 Bilious vomiting: Secondary | ICD-10-CM | POA: Diagnosis not present

## 2017-11-07 DIAGNOSIS — I429 Cardiomyopathy, unspecified: Secondary | ICD-10-CM | POA: Diagnosis present

## 2017-11-07 DIAGNOSIS — R9389 Abnormal findings on diagnostic imaging of other specified body structures: Secondary | ICD-10-CM | POA: Diagnosis not present

## 2017-11-07 DIAGNOSIS — R599 Enlarged lymph nodes, unspecified: Secondary | ICD-10-CM | POA: Diagnosis not present

## 2017-11-07 DIAGNOSIS — Z9841 Cataract extraction status, right eye: Secondary | ICD-10-CM | POA: Diagnosis not present

## 2017-11-07 DIAGNOSIS — J690 Pneumonitis due to inhalation of food and vomit: Secondary | ICD-10-CM | POA: Diagnosis not present

## 2017-11-07 DIAGNOSIS — N183 Chronic kidney disease, stage 3 (moderate): Secondary | ICD-10-CM | POA: Diagnosis not present

## 2017-11-07 DIAGNOSIS — E1122 Type 2 diabetes mellitus with diabetic chronic kidney disease: Secondary | ICD-10-CM | POA: Diagnosis present

## 2017-11-07 DIAGNOSIS — R109 Unspecified abdominal pain: Secondary | ICD-10-CM | POA: Diagnosis not present

## 2017-11-07 DIAGNOSIS — I2510 Atherosclerotic heart disease of native coronary artery without angina pectoris: Secondary | ICD-10-CM | POA: Diagnosis present

## 2017-11-07 DIAGNOSIS — I21A1 Myocardial infarction type 2: Secondary | ICD-10-CM | POA: Diagnosis not present

## 2017-11-07 DIAGNOSIS — I340 Nonrheumatic mitral (valve) insufficiency: Secondary | ICD-10-CM | POA: Diagnosis not present

## 2017-11-07 DIAGNOSIS — E785 Hyperlipidemia, unspecified: Secondary | ICD-10-CM | POA: Diagnosis present

## 2017-11-07 DIAGNOSIS — R933 Abnormal findings on diagnostic imaging of other parts of digestive tract: Secondary | ICD-10-CM | POA: Diagnosis not present

## 2017-11-07 DIAGNOSIS — I5042 Chronic combined systolic (congestive) and diastolic (congestive) heart failure: Secondary | ICD-10-CM | POA: Diagnosis present

## 2017-11-07 DIAGNOSIS — K220 Achalasia of cardia: Secondary | ICD-10-CM | POA: Diagnosis present

## 2017-11-07 DIAGNOSIS — Z91041 Radiographic dye allergy status: Secondary | ICD-10-CM | POA: Diagnosis not present

## 2017-11-07 DIAGNOSIS — R1033 Periumbilical pain: Secondary | ICD-10-CM | POA: Diagnosis not present

## 2017-11-07 DIAGNOSIS — I252 Old myocardial infarction: Secondary | ICD-10-CM | POA: Diagnosis not present

## 2017-11-07 DIAGNOSIS — I10 Essential (primary) hypertension: Secondary | ICD-10-CM | POA: Diagnosis not present

## 2017-11-07 DIAGNOSIS — Z7984 Long term (current) use of oral hypoglycemic drugs: Secondary | ICD-10-CM | POA: Diagnosis not present

## 2017-11-07 DIAGNOSIS — N179 Acute kidney failure, unspecified: Secondary | ICD-10-CM | POA: Diagnosis present

## 2017-11-07 DIAGNOSIS — K429 Umbilical hernia without obstruction or gangrene: Secondary | ICD-10-CM | POA: Diagnosis present

## 2017-11-07 DIAGNOSIS — Z833 Family history of diabetes mellitus: Secondary | ICD-10-CM | POA: Diagnosis not present

## 2017-11-07 DIAGNOSIS — R634 Abnormal weight loss: Secondary | ICD-10-CM | POA: Diagnosis not present

## 2017-11-07 DIAGNOSIS — E860 Dehydration: Secondary | ICD-10-CM | POA: Diagnosis present

## 2017-11-07 DIAGNOSIS — E440 Moderate protein-calorie malnutrition: Secondary | ICD-10-CM | POA: Diagnosis not present

## 2017-11-07 DIAGNOSIS — K449 Diaphragmatic hernia without obstruction or gangrene: Secondary | ICD-10-CM | POA: Diagnosis not present

## 2017-11-07 DIAGNOSIS — R748 Abnormal levels of other serum enzymes: Secondary | ICD-10-CM | POA: Diagnosis not present

## 2017-11-07 DIAGNOSIS — I130 Hypertensive heart and chronic kidney disease with heart failure and stage 1 through stage 4 chronic kidney disease, or unspecified chronic kidney disease: Secondary | ICD-10-CM | POA: Diagnosis present

## 2017-11-07 DIAGNOSIS — I69354 Hemiplegia and hemiparesis following cerebral infarction affecting left non-dominant side: Secondary | ICD-10-CM | POA: Diagnosis not present

## 2017-11-07 DIAGNOSIS — E118 Type 2 diabetes mellitus with unspecified complications: Secondary | ICD-10-CM | POA: Diagnosis not present

## 2017-11-07 DIAGNOSIS — Z955 Presence of coronary angioplasty implant and graft: Secondary | ICD-10-CM | POA: Diagnosis not present

## 2017-11-07 DIAGNOSIS — R079 Chest pain, unspecified: Secondary | ICD-10-CM | POA: Diagnosis not present

## 2017-11-07 DIAGNOSIS — Z9842 Cataract extraction status, left eye: Secondary | ICD-10-CM | POA: Diagnosis not present

## 2017-11-07 DIAGNOSIS — R112 Nausea with vomiting, unspecified: Secondary | ICD-10-CM | POA: Diagnosis present

## 2017-11-07 DIAGNOSIS — Z951 Presence of aortocoronary bypass graft: Secondary | ICD-10-CM | POA: Diagnosis not present

## 2017-11-07 DIAGNOSIS — E43 Unspecified severe protein-calorie malnutrition: Secondary | ICD-10-CM | POA: Diagnosis present

## 2017-11-07 DIAGNOSIS — Z961 Presence of intraocular lens: Secondary | ICD-10-CM | POA: Diagnosis present

## 2017-11-07 LAB — GLUCOSE, CAPILLARY
GLUCOSE-CAPILLARY: 185 mg/dL — AB (ref 70–99)
GLUCOSE-CAPILLARY: 196 mg/dL — AB (ref 70–99)
Glucose-Capillary: 122 mg/dL — ABNORMAL HIGH (ref 70–99)
Glucose-Capillary: 161 mg/dL — ABNORMAL HIGH (ref 70–99)
Glucose-Capillary: 161 mg/dL — ABNORMAL HIGH (ref 70–99)
Glucose-Capillary: 187 mg/dL — ABNORMAL HIGH (ref 70–99)
Glucose-Capillary: 192 mg/dL — ABNORMAL HIGH (ref 70–99)

## 2017-11-07 MED ORDER — ENOXAPARIN SODIUM 30 MG/0.3ML ~~LOC~~ SOLN
30.0000 mg | SUBCUTANEOUS | Status: DC
  Administered 2017-11-08 – 2017-11-13 (×6): 30 mg via SUBCUTANEOUS
  Filled 2017-11-07 (×6): qty 0.3

## 2017-11-07 NOTE — Evaluation (Signed)
Occupational Therapy Evaluation Patient Details Name: [name] MRN: [id number] DOB: 21-Sep-1927 Today's Date: 11/07/2017    History of Present Illness [name] is a 84 y.o. female with medical history significant of DM 2, CVA left-sided weakness, history of CAD,  hypertension, hyperlipidemia, CKD 3, combined systolic diastolic CHF; admitted with dehydration, wt loss, dysphagia   Clinical Impression   Pt admitted with the above. Pt currently with functional limitations due to the deficits listed below (see OT Problem List).  Pt will benefit from skilled OT to increase their safety and independence with ADL and functional mobility for ADL to facilitate discharge to venue listed below.      Follow Up Recommendations  SNF    Equipment Recommendations  None recommended by OT    Recommendations for Other Services       Precautions / Restrictions Precautions Precautions: Fall Precaution Comments: CVA L hemi Restrictions Weight Bearing Restrictions: No      Mobility Bed Mobility Overal bed mobility: Needs Assistance Bed Mobility: Supine to Sit     Supine to sit: Max assist     General bed mobility comments: pt in chair  Transfers Overall transfer level: Needs assistance Equipment used: None Transfers: Sit to/from Stand Sit to Stand: +2 physical assistance;+2 safety/equipment;Mod assist Stand pivot transfers: +2 physical assistance;+2 safety/equipment;Max assist       General transfer comment: very unsteady with noted L side weakness UE and LE.  Assisted from elevateed bed to [facility] with difficulty turning and maintaining a safe balance.  25% VC's on hand placement and transfer.  Assisted with sit to stand for hygiene Max Assist to prevent LOB.  Required assist with stand to sit to control decend and cue on hand placement.          ADL either performed or assessed with clinical judgement   ADL Overall ADL's : Needs assistance/impaired Eating/Feeding: Set  up;Sitting   Grooming: Set up;Sitting   Upper Body Bathing: Minimal assistance;Sitting   Lower Body Bathing: Sit to/from stand;Cueing for safety;Maximal assistance;+2 for physical assistance;+2 for safety/equipment   Upper Body Dressing : Minimal assistance;Sitting   Lower Body Dressing: +2 for safety/equipment;+2 for physical assistance;Maximal assistance                                    Pertinent Vitals/Pain Pain Assessment: No/denies pain           Communication Communication Communication: No difficulties   Cognition Arousal/Alertness: Awake/alert Behavior During Therapy: WFL for tasks assessed/performed Overall Cognitive Status: Within Functional Limits for tasks assessed                                 General Comments: pleasant lady   General Comments               Home Living Family/patient expects to be discharged to:: Private residence Living Arrangements: Children Available Help at Discharge: Available PRN/intermittently(son and nephew) Type of Home: House Home Access: Stairs to enter Technical brewer of Steps: 2 Entrance Stairs-Rails: None Home Layout: Multi-level Alternate Level Stairs-Number of Steps: 5 and 5              Home Equipment: Walker - 2 wheels;Shower seat;Wheelchair - manual   Additional Comments: no family present; pt states she was amb in the house with RW and up/down stairs without difficulty; unsure of  accuracy--per RN pt with early decubitus indicating she may not have been as mobile as she states      Prior Functioning/Environment Level of Independence: Independent with assistive device(s);Independent        Comments: amb with RW         OT Problem List: Decreased activity tolerance;Decreased strength;Decreased safety awareness;Decreased knowledge of use of DME or AE      OT Treatment/Interventions: Self-care/ADL training;Patient/family education;Therapeutic activities    OT  Goals(Current goals can be found in the care plan section) Acute Rehab OT Goals Patient Stated Goal: get stronger OT Goal Formulation: With patient Time For Goal Achievement: 11/21/17 Potential to Achieve Goals: Good ADL Goals Pt Will Perform Grooming: with supervision;sitting Pt Will Perform Lower Body Dressing: with supervision;sit to/from stand Pt Will Transfer to Toilet: with supervision;stand pivot transfer;bedside commode Pt Will Perform Toileting - Clothing Manipulation and hygiene: with supervision;sit to/from stand  OT Frequency: Min 2X/week   Barriers to D/C:               AM-PAC PT "6 Clicks" Daily Activity     Outcome Measure Help from another person eating meals?: A Little Help from another person taking care of personal grooming?: A Little Help from another person toileting, which includes using toliet, bedpan, or urinal?: A Lot Help from another person bathing (including washing, rinsing, drying)?: Total Help from another person to put on and taking off regular upper body clothing?: A Little Help from another person to put on and taking off regular lower body clothing?: Total 6 Click Score: 13   End of Session Nurse Communication: Mobility status  Activity Tolerance: Patient tolerated treatment well Patient left: in chair;with call bell/phone within reach;with chair alarm set  OT Visit Diagnosis: Unsteadiness on feet (R26.81);Muscle weakness (generalized) (M62.81)                Time: 1351-1405 OT Time Calculation (min): 14 min Charges:  OT General Charges $OT Visit: 1 Visit OT Evaluation $OT Eval Moderate Complexity: 1 Mod  Kari Baars, OT Acute Rehabilitation Services [phone] Office- [phone]     [place], [name] 11/07/2017, 2:51 PM

## 2017-11-07 NOTE — Progress Notes (Signed)
CSW attempted to assess patient for SNF consult, patient asleep. CSW will try again at a later time.  [name], Justice Social Worker [facility] Cell#: [phone]

## 2017-11-07 NOTE — Progress Notes (Signed)
Physical Therapy Treatment Patient Details Name: [name] MRN: [id number] DOB: 06-24-1927 Today's Date: 11/07/2017    History of Present Illness [name] is a 84 y.o. female with medical history significant of DM 2, CVA left-sided weakness, history of CAD,  hypertension, hyperlipidemia, CKD 3, combined systolic diastolic CHF; admitted with dehydration, wt loss, dysphagia    PT Comments    Pt required + 2 assist for all mobility.  Assisted OOB to [facility] to void then am in hallway twice.  General transfer comment: very unsteady with noted L side weakness UE and LE.  Assisted from elevateed bed to [facility] with difficulty turning and maintaining a safe balance.  25% VC's on hand placement and transfer.  Assisted with sit to stand for hygiene Max Assist to prevent LOB.  Required assist with stand to sit to control decend and cue on hand placement.  General Gait Details: very unsteady gait with noted L LE weakness/knee buckle and limited distance due to weakness/fatigue Pt will need ST Rehab at SNF prior to returning home.    Follow Up Recommendations  SNF     Equipment Recommendations  None recommended by PT    Recommendations for Other Services       Precautions / Restrictions Precautions Precautions: Fall Precaution Comments: CVA L hemi Restrictions Weight Bearing Restrictions: No    Mobility  Bed Mobility Overal bed mobility: Needs Assistance Bed Mobility: Supine to Sit     Supine to sit: Max assist     General bed mobility comments: HOB elevated and use of rail pt required assist with upper body and assist to complete scooting to EOB.  Posterior LOB x 2  Transfers Overall transfer level: Needs assistance Equipment used: None Transfers: Sit to/from [facility] Sit to Stand: +2 physical assistance;+2 safety/equipment;Min assist;Mod assist Stand pivot transfers: +2 physical assistance;+2 safety/equipment;Max assist       General transfer comment: very  unsteady with noted L side weakness UE and LE.  Assisted from elevateed bed to [facility] with difficulty turning and maintaining a safe balance.  25% VC's on hand placement and transfer.  Assisted with sit to stand for hygiene Max Assist to prevent LOB.  Required assist with stand to sit to control decend and cue on hand placement.    Ambulation/Gait Ambulation/Gait assistance: Mod assist;+2 physical assistance Gait Distance (Feet): 40 Feet(20 feet x 2 one sitting rest break) Assistive device: Rolling walker (2 wheeled) Gait Pattern/deviations: Step-to pattern;Decreased stance time - left;Ataxic;Trunk flexed Gait velocity: decreased   General Gait Details: very unsteady gait with noted L LE weakness/knee buckle and limited distance due to weakness/fatigue   Stairs             Wheelchair Mobility    Modified Rankin (Stroke Patients Only)       Balance                                            Cognition Arousal/Alertness: Awake/alert Behavior During Therapy: WFL for tasks assessed/performed Overall Cognitive Status: Within Functional Limits for tasks assessed                                 General Comments: pleasant lady      Exercises      General Comments  Pertinent Vitals/Pain Pain Assessment: No/denies pain    Home Living                      Prior Function            PT Goals (current goals can now be found in the care plan section) Progress towards PT goals: Progressing toward goals    Frequency    Min 3X/week      PT Plan Current plan remains appropriate    Co-evaluation              AM-PAC PT "6 Clicks" Daily Activity  Outcome Measure  Difficulty turning over in bed (including adjusting bedclothes, sheets and blankets)?: A Lot Difficulty moving from lying on back to sitting on the side of the bed? : A Lot Difficulty sitting down on and standing up from a chair with arms (e.g.,  wheelchair, bedside commode, etc,.)?: A Lot Help needed moving to and from a bed to chair (including a wheelchair)?: A Lot Help needed walking in hospital room?: Total   6 Click Score: 9    End of Session Equipment Utilized During Treatment: Gait belt Activity Tolerance: Patient limited by fatigue Patient left: in chair;with call bell/phone within reach   PT Visit Diagnosis: Muscle weakness (generalized) (M62.81)     Time: 5397-6734 PT Time Calculation (min) (ACUTE ONLY): 29 min  Charges:  $Gait Training: 8-22 mins $Therapeutic Activity: 8-22 mins                     Rica Koyanagi  PTA Acute  Rehabilitation Services Pager      [phone] Office      [phone]

## 2017-11-07 NOTE — Progress Notes (Signed)
Progress Note   Subjective  Chief Complaint: Dysphagia, weight loss  Today, patient reports that she is hungry but knows that she is not supposed to eat until after testing later today.  She denies any new complaints or concerns.  Has still not had a bowel movement since time of admission is somewhat concerned about this but also tells me that "I have not eaten anything".  Denies any abdominal pain.   Objective   Vital signs in last 24 hours: Temp:  [98.5 F (36.9 C)-99.5 F (37.5 C)] 98.6 F (37 C) (10/08 0513) Pulse Rate:  [81-100] 88 (10/08 0513) Resp:  [20-24] 20 (10/08 0513) BP: (134-163)/(55-73) 145/62 (10/08 0513) SpO2:  [98 %-100 %] 100 % (10/08 0513) Weight:  [60.2 kg] 60.2 kg (10/08 0900) Last BM Date: (pta) General:   Elderly AA female in NAD Heart:  Regular rate and rhythm; no murmurs Lungs: Respirations even and unlabored, lungs CTA bilaterally Abdomen:  Soft, nontender and nondistended. Normal bowel sounds. Extremities:  Without edema. Neurologic:  Alert and oriented,  grossly normal neurologically. Psych:  Cooperative. Normal mood and affect.  Intake/Output from previous day: 10/07 0701 - 10/08 0700 In: -  Out: 400 [Urine:400]  Lab Results: Recent Labs    11/04/17 1110 11/05/17 0514  WBC 10.0 7.9  HGB 9.6* 9.1*  HCT 30.2* 27.9*  PLT 484* 456*   BMET Recent Labs    11/04/17 1110 11/05/17 0514  NA 133* 135  K 5.1 4.1  CL 93* 99  CO2 27 24  GLUCOSE 356* 192*  BUN 14 13  CREATININE 1.50* 1.49*  CALCIUM 10.3 9.8   LFT Recent Labs    11/05/17 0514  PROT 6.8  ALBUMIN 2.6*  AST 14*  ALT 9  ALKPHOS 59  BILITOT 1.1   Studies/Results: Dg Esophagus  Result Date: 11/06/2017 CLINICAL DATA:  Dysphagia.  Nausea vomiting EXAM: ESOPHOGRAM/BARIUM SWALLOW TECHNIQUE: Single contrast examination was performed using  thin barium. FLUOROSCOPY TIME:  Fluoroscopy Time:  1 minutes 48 seconds Radiation Exposure Index (if provided by the fluoroscopic  device): Number of Acquired Spot Images: 0 COMPARISON:  CT abdomen pelvis 11/04/2017 FINDINGS: Poor esophageal motility. Esophagus diffusely dilated with poor motility. No stricture or mass. PET Moderately large hiatal hernia. Sliding hiatal hernia. Grossly normal stomach. Barium tablet not administered due to poor motility and retained barium throughout the esophagus. In addition, the patient was not able to stand upright. IMPRESSION: Moderately large hiatal hernia with poor esophageal motility. Negative for stricture or mass. Electronically Signed   By: Franchot Gallo M.D.   On: 11/06/2017 10:09     Assessment / Plan:    Assessment: 1.  Dysphagia: 1 month of early satiety and daily nausea with unexplained dysphagia, swallowing study showing decreased motility and hiatal hernia which are likely contributing, also question possible gastroparesis versus outlet obstruction versus other 2.  Coronary atherosclerosis: On Plavix 3.  Malnutrition/weight loss 4.  Abdominal pain: Some better today, question relation to abnormal CT with abnormal lymph node in the fat-containing umbilical hernia and abnormal right middle lung lobe  Plan: 1.  Continue twice daily PPI 2.  Upper GI series and gastric emptying study ordered for today 3.  CEA, CA 19-9 at Ca 125 are pending 4.  Again consider pulmonary consultation to interpret abnormal CT findings as well as possible IR biopsy of known lymph node for definitive diagnosis 5.  We will await results of studies above for further recommendations 6.  Please await  further recommendations from Dr. Tarri Glenn later today  Thank you for kind consultation, we will continue to follow.   LOS: 0 days   Levin Erp  11/07/2017, 9:24 AM

## 2017-11-07 NOTE — Progress Notes (Signed)
PROGRESS NOTE    [name]  QJF:[id number] DOB: 1927-02-15 DOA: 11/04/2017 PCP: Deland Pretty, MD   Brief Narrative: [name] is a 84 y.o. female with medical history significant of DM 2, CVA left-sided weakness, history of CAD, hypertension, hyperlipidemia, CKD 3, combined systolic diastolic CHF   Assessment & Plan:   Active Problems:   Hyperlipidemia   Essential hypertension   CORONARY ATHEROSCLEROSIS NATIVE CORONARY ARTERY   Diabetes mellitus with complication (HCC)   Hemiparesis affecting left side as late effect of cerebrovascular accident (CVA) ([place])   Stage 3 chronic kidney disease (HCC)   Malnutrition of moderate degree   Dehydration   Abdominal pain   Pressure injury of skin   Bilious vomiting with nausea   Enlarged lymph node   Dysphagia Patient with decreased oral intake secondary to this problem. This is the likely cause of her dehydration as it is significantly decreasing her ability to adequately hydrate herself. Esophagram significant for poor esophageal motility and moderately large hiatal hernia. -GI recommendations: UGI and gastric emptying study -IV fluids in setting of NPO status for multiple testing  Dehydration Secondary to above. Contributed to kidney injury. -Continue IV fluids  Acute kidney injury Secondary to hypovolemia from above issues. Creatinine back to baseline. -IV fluids as mentioned above  Anemia Baseline hemoglobin of around 10. Stable.  Essential hypertension Slightly hypertensive -Continue Clonidine, amlodipine, Imdur  History of stroke CAD No real deficits. -Continue Plavix  Diabetes mellitus Hemoglobin A1C of 9%. On glipizide as an outpatient. -Continue SSI  Stage 3 CKD Baseline creatinine of 2.3-5.5. Currently below baseline.  Abdominal pain Resolved.  Adnexal cyst Incidental finding. -Outpatient Gyn follow-up  Chronic systolic and diastolic heart failure EF now preserved with residual grade 1  diastolic dysfunction.  Pressure ulcer Stage II sacral  Moderate malnutrition -Dietitian recommendations: Ensure Enlive  Lymph node Per radiology report, ? Sister Wynona Dove node. GI requesting biopsy -IR consult  Ground glass opacities on CT Unknown etiology. No evidence of infection. Asymptomatic -Recommend follow-up   DVT prophylaxis: Lovenox Code Status:   Code Status: Full Code Family Communication: None at bedside. Discussed with daughter on telephone Disposition Plan: Discharge pending GI recommendations/mamagement   Consultants:   [facility]  Procedures:   None  Antimicrobials:  None    Subjective: No issues overnight.  Objective: Vitals:   11/06/17 1443 11/06/17 2042 11/07/17 0513 11/07/17 0900  BP: (!) 134/55 (!) 152/68 (!) 145/62   Pulse: 81 100 88   Resp: (!) 22 (!) 24 20   Temp: 99.5 F (37.5 C) 98.5 F (36.9 C) 98.6 F (37 C)   TempSrc: Oral Oral Oral   SpO2: 100% 98% 100%   Weight:    60.2 kg  Height:        Intake/Output Summary (Last 24 hours) at 11/07/2017 1301 Last data filed at 11/06/2017 1416 Gross per 24 hour  Intake -  Output 400 ml  Net -400 ml   Filed Weights   11/04/17 1047 11/07/17 0900  Weight: 57.2 kg 60.2 kg    Examination:  General exam: Appears calm and comfortable Respiratory system: Clear to auscultation. Respiratory effort normal. Cardiovascular system: S1 & S2 heard, RRR. No murmurs, rubs, gallops or clicks. Gastrointestinal system: Abdomen is nondistended, soft and nontender. No organomegaly or masses felt. Normal bowel sounds heard. Central nervous system: Alert and oriented. No focal neurological deficits. Extremities: No edema. No calf tenderness Skin: No cyanosis. No rashes Psychiatry: Judgement and insight appear normal. Mood &  affect appropriate.      Data Reviewed: I have personally reviewed following labs and imaging studies  CBC: Recent Labs  Lab 11/04/17 1110 11/05/17 0514  WBC 10.0 7.9    HGB 9.6* 9.1*  HCT 30.2* 27.9*  MCV 83.9 84.3  PLT 484* 144*   Basic Metabolic Panel: Recent Labs  Lab 11/04/17 1110 11/05/17 0514  NA 133* 135  K 5.1 4.1  CL 93* 99  CO2 27 24  GLUCOSE 356* 192*  BUN 14 13  CREATININE 1.50* 1.49*  CALCIUM 10.3 9.8  MG  --  1.8  PHOS  --  2.5   GFR: Estimated Creatinine Clearance: 21.6 mL/min (A) (by C-G formula based on SCr of 1.49 mg/dL (H)). Liver Function Tests: Recent Labs  Lab 11/04/17 1110 11/05/17 0514  AST 17 14*  ALT 10 9  ALKPHOS 70 59  BILITOT 0.9 1.1  PROT 8.0 6.8  ALBUMIN 2.9* 2.6*   Recent Labs  Lab 11/04/17 1110  LIPASE 33   No results for input(s): AMMONIA in the last 168 hours. Coagulation Profile: No results for input(s): INR, PROTIME in the last 168 hours. Cardiac Enzymes: No results for input(s): CKTOTAL, CKMB, CKMBINDEX, TROPONINI in the last 168 hours. BNP (last 3 results) No results for input(s): PROBNP in the last 8760 hours. HbA1C: No results for input(s): HGBA1C in the last 72 hours. CBG: Recent Labs  Lab 11/06/17 2044 11/07/17 0050 11/07/17 0431 11/07/17 0732 11/07/17 1156  GLUCAP 287* 196* 185* 161* 161*   Lipid Profile: No results for input(s): CHOL, HDL, LDLCALC, TRIG, CHOLHDL, LDLDIRECT in the last 72 hours. Thyroid Function Tests: Recent Labs    11/05/17 0514  TSH 0.854   Anemia Panel: Recent Labs    11/06/17 1742  VITAMINB12 209  FOLATE 6.2  FERRITIN 397*  TIBC 117*  IRON 12*  RETICCTPCT 1.8   Sepsis Labs: No results for input(s): PROCALCITON, LATICACIDVEN in the last 168 hours.  No results found for this or any previous visit (from the past 240 hour(s)).       Radiology Studies: Dg Abd 1 View  Result Date: 11/07/2017 CLINICAL DATA:  84 year old female. Post esophagram. Possible upper GI series. Subsequent encounter. EXAM: ABDOMEN - 1 VIEW COMPARISON:  11/06/2017 esophagram.  11/04/2017 CT. FINDINGS: Residual contrast throughout the colon. Scoliosis lumbar  spine convex right with superimposed degenerative changes. Post CABG. IMPRESSION: Residual contrast throughout the colon. Electronically Signed   By: Genia Del M.D.   On: 11/07/2017 12:16   Dg Esophagus  Result Date: 11/06/2017 CLINICAL DATA:  Dysphagia.  Nausea vomiting EXAM: ESOPHOGRAM/BARIUM SWALLOW TECHNIQUE: Single contrast examination was performed using  thin barium. FLUOROSCOPY TIME:  Fluoroscopy Time:  1 minutes 48 seconds Radiation Exposure Index (if provided by the fluoroscopic device): Number of Acquired Spot Images: 0 COMPARISON:  CT abdomen pelvis 11/04/2017 FINDINGS: Poor esophageal motility. Esophagus diffusely dilated with poor motility. No stricture or mass. PET Moderately large hiatal hernia. Sliding hiatal hernia. Grossly normal stomach. Barium tablet not administered due to poor motility and retained barium throughout the esophagus. In addition, the patient was not able to stand upright. IMPRESSION: Moderately large hiatal hernia with poor esophageal motility. Negative for stricture or mass. Electronically Signed   By: Franchot Gallo M.D.   On: 11/06/2017 10:09        Scheduled Meds: . amLODipine  10 mg Oral Daily  . atorvastatin  40 mg Oral Daily  . cloNIDine  0.1 mg Oral BID  . clopidogrel  75 mg Oral Daily  . enoxaparin (LOVENOX) injection  30 mg Subcutaneous Q24H  . feeding supplement (ENSURE ENLIVE)  237 mL Oral BID BM  . insulin aspart  0-9 Units Subcutaneous Q4H  . isosorbide mononitrate  30 mg Oral Daily  . metoprolol tartrate  25 mg Oral BID  . pantoprazole  40 mg Oral BID   Continuous Infusions: . sodium chloride 100 mL/hr at 11/07/17 0920     LOS: 0 days     Cordelia Poche, MD [facility] 11/07/2017, 1:01 PM  If 7PM-7AM, please contact night-coverage [email or web address] 11/07/2017, 1:01 PM

## 2017-11-08 ENCOUNTER — Inpatient Hospital Stay (HOSPITAL_COMMUNITY): Payer: Medicare Other

## 2017-11-08 ENCOUNTER — Encounter (HOSPITAL_COMMUNITY): Payer: Self-pay | Admitting: Physician Assistant

## 2017-11-08 DIAGNOSIS — I5022 Chronic systolic (congestive) heart failure: Secondary | ICD-10-CM

## 2017-11-08 DIAGNOSIS — L89152 Pressure ulcer of sacral region, stage 2: Secondary | ICD-10-CM

## 2017-11-08 DIAGNOSIS — R109 Unspecified abdominal pain: Secondary | ICD-10-CM

## 2017-11-08 LAB — BASIC METABOLIC PANEL
ANION GAP: 8 (ref 5–15)
BUN: 7 mg/dL — ABNORMAL LOW (ref 8–23)
CALCIUM: 9.1 mg/dL (ref 8.9–10.3)
CO2: 21 mmol/L — ABNORMAL LOW (ref 22–32)
Chloride: 106 mmol/L (ref 98–111)
Creatinine, Ser: 1.26 mg/dL — ABNORMAL HIGH (ref 0.44–1.00)
GFR, EST AFRICAN AMERICAN: 43 mL/min — AB (ref >60)
GFR, EST NON AFRICAN AMERICAN: 37 mL/min — AB (ref >60)
GLUCOSE: 109 mg/dL — AB (ref 70–99)
POTASSIUM: 3.9 mmol/L (ref 3.5–5.1)
Sodium: 135 mmol/L (ref 135–145)

## 2017-11-08 LAB — CBC
HCT: 24.5 % — ABNORMAL LOW (ref 36.0–46.0)
HEMOGLOBIN: 7.6 g/dL — AB (ref 12.0–15.0)
MCH: 27 pg (ref 26.0–34.0)
MCHC: 31 g/dL (ref 30.0–36.0)
MCV: 86.9 fL (ref 80.0–100.0)
Platelets: 342 10*3/uL (ref 150–400)
RBC: 2.82 MIL/uL — ABNORMAL LOW (ref 3.87–5.11)
RDW: 15 % (ref 11.5–15.5)
WBC: 8 10*3/uL (ref 4.0–10.5)
nRBC: 0 % (ref 0.0–0.2)

## 2017-11-08 LAB — GLUCOSE, CAPILLARY
GLUCOSE-CAPILLARY: 102 mg/dL — AB (ref 70–99)
GLUCOSE-CAPILLARY: 169 mg/dL — AB (ref 70–99)
Glucose-Capillary: 135 mg/dL — ABNORMAL HIGH (ref 70–99)
Glucose-Capillary: 166 mg/dL — ABNORMAL HIGH (ref 70–99)
Glucose-Capillary: 204 mg/dL — ABNORMAL HIGH (ref 70–99)

## 2017-11-08 LAB — CEA: CEA1: 2.8 ng/mL (ref 0.0–4.7)

## 2017-11-08 LAB — CANCER ANTIGEN 19-9: CAN 19-9: 38 U/mL — AB (ref 0–35)

## 2017-11-08 LAB — PROTIME-INR
INR: 1.02
Prothrombin Time: 13.3 seconds (ref 11.4–15.2)

## 2017-11-08 LAB — CA 125: Cancer Antigen (CA) 125: 64.4 U/mL — ABNORMAL HIGH (ref 0.0–38.1)

## 2017-11-08 MED ORDER — SENNOSIDES-DOCUSATE SODIUM 8.6-50 MG PO TABS: 1.0000 | ORAL_TABLET | Freq: Every evening | ORAL | Status: DC | PRN

## 2017-11-08 MED ORDER — SODIUM CHLORIDE 0.9 % IV SOLN
125.0000 mg | INTRAVENOUS | Status: DC
  Administered 2017-11-08: 125 mg via INTRAVENOUS
  Filled 2017-11-08 (×2): qty 10

## 2017-11-08 MED ORDER — TECHNETIUM TC 99M SULFUR COLLOID
2.1000 | Freq: Once | INTRAVENOUS | Status: AC | PRN
  Administered 2017-11-08: 2.1 via INTRAVENOUS

## 2017-11-08 MED ORDER — SENNOSIDES-DOCUSATE SODIUM 8.6-50 MG PO TABS
1.0000 | ORAL_TABLET | Freq: Two times a day (BID) | ORAL | Status: DC
  Administered 2017-11-08 – 2017-11-23 (×23): 1 via ORAL
  Filled 2017-11-08 (×26): qty 1

## 2017-11-08 MED ORDER — POLYETHYLENE GLYCOL 3350 17 G PO PACK
17.0000 g | PACK | Freq: Once | ORAL | Status: AC
  Administered 2017-11-08: 17 g via ORAL
  Filled 2017-11-08: qty 1

## 2017-11-08 MED ORDER — ALUM & MAG HYDROXIDE-SIMETH 200-200-20 MG/5ML PO SUSP
30.0000 mL | ORAL | Status: DC | PRN
  Administered 2017-11-08: 30 mL via ORAL
  Filled 2017-11-08: qty 30

## 2017-11-08 MED ORDER — POLYETHYLENE GLYCOL 3350 17 G PO PACK
17.0000 g | PACK | Freq: Every day | ORAL | Status: DC
  Administered 2017-11-11 – 2017-11-23 (×7): 17 g via ORAL
  Filled 2017-11-08 (×10): qty 1

## 2017-11-08 NOTE — Progress Notes (Signed)
PROGRESS NOTE    [name]  MVE:[id number] DOB: 08/30/1927 DOA: 11/04/2017 PCP: Deland Pretty, MD   Brief Narrative:  84 year old with a history of diabetes mellitus type 2, CVA with left-sided residual weakness, CAD, hypertension, hyperlipidemia, CKD stage III, combined systolic and diastolic congestive heart failure came to the hospital with complains of nausea and vomiting for about 1 week.  She admits of weight loss.  She has had very poor p.o. intake during this time.  Further dysphagia she had CT of the abdomen pelvis and barium esophagram done which were relatively unremarkable besides a large hiatal hernia.  CT scan did show lymph node in the fat containing umbilical hernia.  Started on PPI twice daily.  Was diagnosed with iron deficiency anemia therefore supplements were started along with bowel regimen.  Upper GI series was ordered.   Assessment & Plan:   Active Problems:   Hyperlipidemia   Essential hypertension   CORONARY ATHEROSCLEROSIS NATIVE CORONARY ARTERY   Diabetes mellitus with complication (HCC)   Hemiparesis affecting left side as late effect of cerebrovascular accident (CVA) ([place])   Stage 3 chronic kidney disease (HCC)   Malnutrition of moderate degree   Dehydration   Abdominal pain   Pressure injury of skin   Bilious vomiting with nausea   Enlarged lymph node   Dysphagia  Dysphagia, unknown etiology Moderate to severe protein calorie malnutrition Iron deficiency anemia - CT of the abdomen pelvis and barium esophagram is relatively unremarkable except large hiatal hernia.  Continue PPI twice daily -Plan for upper GI series today -Gastroenterology following - CEA levels within normal limits, Ca1 25 elevated at 64, CA 19-9 slightly elevated at 38 -IV iron started, will transition to oral when appropriate.  Bowel regimen PRN  History of coronary artery disease - Continue Plavix, statin, metoprolol in isosorbide mononitrate  Essential  hypertension -Continue Norvasc 10 mg daily, clonidine 0.1 mg twice daily, metoprolol 25 mg twice daily, isosorbide mononitrate 30 mg daily  History of CVA status post residual left-sided weakness - Continue Plavix and statin  Diabetes mellitus type 2 -Accu-Chek and sliding scale.  Home meds on hold  CKD stage III -Appears to be stable.  Baseline is around 1.4, today is 1.2.  Chronic congestive diastolic and systolic heart failure -Ejection fraction 55 to 28%, grade 1 diastolic dysfunction in 3662.  Continue her home regimen of metoprolol and isosorbide mononitrate.  Stage II sacral pressure ulcer -Wound care per nursing staff  DVT prophylaxis: Lovenox Code Status: Full code Family Communication: None at bedside Disposition Plan: To be determined depending on results from upper GI series  Consultants:   Gastroenterology  Procedures:   None  Antimicrobials:   None   Subjective: Check done on the patient 3 times today, patient is not in the room.  I will attempt to check back later on again. Patient's chart has been reviewed for this hospitalization  Review of Systems Unable to obtain  Objective: Vitals:   11/07/17 0900 11/07/17 1345 11/07/17 2016 11/08/17 0426  BP:  (!) 147/71 (!) 160/71 (!) 155/66  Pulse:  84 100 84  Resp:  16 20 20   Temp:  98.8 F (37.1 C) (!) 100.4 F (38 C) 99.9 F (37.7 C)  TempSrc:  Oral Oral   SpO2:  100% 100% 100%  Weight: 60.2 kg     Height:        Intake/Output Summary (Last 24 hours) at 11/08/2017 1202 Last data filed at 11/08/2017 0430 Gross per 24 hour  Intake 240 ml  Output 800 ml  Net -560 ml   Filed Weights   11/04/17 1047 11/07/17 0900  Weight: 57.2 kg 60.2 kg    Examination: Unable to perform   Data Reviewed:   CBC: Recent Labs  Lab 11/04/17 1110 11/05/17 0514 11/08/17 0615  WBC 10.0 7.9 8.0  HGB 9.6* 9.1* 7.6*  HCT 30.2* 27.9* 24.5*  MCV 83.9 84.3 86.9  PLT 484* 456* 580   Basic Metabolic  Panel: Recent Labs  Lab 11/04/17 1110 11/05/17 0514 11/08/17 0528  NA 133* 135 135  K 5.1 4.1 3.9  CL 93* 99 106  CO2 27 24 21*  GLUCOSE 356* 192* 109*  BUN 14 13 7*  CREATININE 1.50* 1.49* 1.26*  CALCIUM 10.3 9.8 9.1  MG  --  1.8  --   PHOS  --  2.5  --    GFR: Estimated Creatinine Clearance: 25.5 mL/min (A) (by C-G formula based on SCr of 1.26 mg/dL (H)). Liver Function Tests: Recent Labs  Lab 11/04/17 1110 11/05/17 0514  AST 17 14*  ALT 10 9  ALKPHOS 70 59  BILITOT 0.9 1.1  PROT 8.0 6.8  ALBUMIN 2.9* 2.6*   Recent Labs  Lab 11/04/17 1110  LIPASE 33   No results for input(s): AMMONIA in the last 168 hours. Coagulation Profile: No results for input(s): INR, PROTIME in the last 168 hours. Cardiac Enzymes: No results for input(s): CKTOTAL, CKMB, CKMBINDEX, TROPONINI in the last 168 hours. BNP (last 3 results) No results for input(s): PROBNP in the last 8760 hours. HbA1C: No results for input(s): HGBA1C in the last 72 hours. CBG: Recent Labs  Lab 11/07/17 1622 11/07/17 1948 11/07/17 2342 11/08/17 0427 11/08/17 0730  GLUCAP 187* 192* 122* 102* 135*   Lipid Profile: No results for input(s): CHOL, HDL, LDLCALC, TRIG, CHOLHDL, LDLDIRECT in the last 72 hours. Thyroid Function Tests: No results for input(s): TSH, T4TOTAL, FREET4, T3FREE, THYROIDAB in the last 72 hours. Anemia Panel: Recent Labs    11/06/17 1742  VITAMINB12 209  FOLATE 6.2  FERRITIN 397*  TIBC 117*  IRON 12*  RETICCTPCT 1.8   Sepsis Labs: No results for input(s): PROCALCITON, LATICACIDVEN in the last 168 hours.  No results found for this or any previous visit (from the past 240 hour(s)).       Radiology Studies: Dg Abd 1 View  Result Date: 11/07/2017 CLINICAL DATA:  84 year old female. Post esophagram. Possible upper GI series. Subsequent encounter. EXAM: ABDOMEN - 1 VIEW COMPARISON:  11/06/2017 esophagram.  11/04/2017 CT. FINDINGS: Residual contrast throughout the colon.  Scoliosis lumbar spine convex right with superimposed degenerative changes. Post CABG. IMPRESSION: Residual contrast throughout the colon. Electronically Signed   By: Genia Del M.D.   On: 11/07/2017 12:16        Scheduled Meds: . amLODipine  10 mg Oral Daily  . atorvastatin  40 mg Oral Daily  . cloNIDine  0.1 mg Oral BID  . clopidogrel  75 mg Oral Daily  . enoxaparin (LOVENOX) injection  30 mg Subcutaneous Q24H  . feeding supplement (ENSURE ENLIVE)  237 mL Oral BID BM  . insulin aspart  0-9 Units Subcutaneous Q4H  . isosorbide mononitrate  30 mg Oral Daily  . metoprolol tartrate  25 mg Oral BID  . pantoprazole  40 mg Oral BID   Continuous Infusions: . sodium chloride 100 mL/hr at 11/08/17 0509  . ferric gluconate (FERRLECIT/NULECIT) IV       LOS: 1 day  Time spent= 20 mins    Luwanna Brossman Arsenio Loader, MD [facility] Pager [phone]   If 7PM-7AM, please contact night-coverage [email or web address] Password [email or web address] 11/08/2017, 12:02 PM

## 2017-11-08 NOTE — Progress Notes (Signed)
Spoke with pts son and he agrees and wants his mother to have the biopsy. Pt is able to sign own consent.

## 2017-11-08 NOTE — Progress Notes (Signed)
[facility] Progress Note  CC:  Dysphagia and weight loss  Subjective:  CA 19-9 minimally elevated.  CA 125 about double normal limits as well.  CEA normal.  GES from today shows minimally delayed gastric emptying.  Still awaiting UGI and LN biopsy.  Radiology actually came during my visit to take her for UGI study.  She complains of burning in her chest.  Says that after the GES when she returned to her room she vomited but only very small amount of food material.  They have decided not to proceed with LN biopsy after speaking with IR.  Objective:  Vital signs in last 24 hours: Temp:  [98.8 F (37.1 C)-100.4 F (38 C)] 99.9 F (37.7 C) (10/09 0426) Pulse Rate:  [84-100] 84 (10/09 0426) Resp:  [16-20] 20 (10/09 0426) BP: (147-160)/(66-71) 155/66 (10/09 0426) SpO2:  [100 %] 100 % (10/09 0426) Weight:  [60.2 kg] 60.2 kg (10/08 0900) Last BM Date: 11/03/17 General:  Alert, Well-developed, in NAD Heart:  Regular rate and rhythm; no murmurs Pulm:  CTAB.  No increased WOB. Abdomen:  Soft, non-distended.  BS present.  Non-tender.  Extremities:  Without edema. Neurologic:  Alert and oriented x 4;  grossly normal neurologically. Psych:  Alert and cooperative. Normal mood and affect.  Intake/Output from previous day: 10/08 0701 - 10/09 0700 In: 240 [P.O.:240] Out: 800 [Urine:800]  Lab Results: Recent Labs    11/08/17 0615  WBC 8.0  HGB 7.6*  HCT 24.5*  PLT 342   BMET Recent Labs    11/08/17 0528  NA 135  K 3.9  CL 106  CO2 21*  GLUCOSE 109*  BUN 7*  CREATININE 1.26*  CALCIUM 9.1   Dg Abd 1 View  Result Date: 11/07/2017 CLINICAL DATA:  84 year old female. Post esophagram. Possible upper GI series. Subsequent encounter. EXAM: ABDOMEN - 1 VIEW COMPARISON:  11/06/2017 esophagram.  11/04/2017 CT. FINDINGS: Residual contrast throughout the colon. Scoliosis lumbar spine convex right with superimposed degenerative changes. Post CABG. IMPRESSION: Residual  contrast throughout the colon. Electronically Signed   By: Genia Del M.D.   On: 11/07/2017 12:16   Dg Esophagus  Result Date: 11/06/2017 CLINICAL DATA:  Dysphagia.  Nausea vomiting EXAM: ESOPHOGRAM/BARIUM SWALLOW TECHNIQUE: Single contrast examination was performed using  thin barium. FLUOROSCOPY TIME:  Fluoroscopy Time:  1 minutes 48 seconds Radiation Exposure Index (if provided by the fluoroscopic device): Number of Acquired Spot Images: 0 COMPARISON:  CT abdomen pelvis 11/04/2017 FINDINGS: Poor esophageal motility. Esophagus diffusely dilated with poor motility. No stricture or mass. PET Moderately large hiatal hernia. Sliding hiatal hernia. Grossly normal stomach. Barium tablet not administered due to poor motility and retained barium throughout the esophagus. In addition, the patient was not able to stand upright. IMPRESSION: Moderately large hiatal hernia with poor esophageal motility. Negative for stricture or mass. Electronically Signed   By: Franchot Gallo M.D.   On: 11/06/2017 10:09   Assessment / Plan: 1.  Dysphagia: 1 month of early satiety and daily nausea with unexplained dysphagia, swallowing study showing decreased motility and hiatal hernia which are likely contributing.  GES showed minimal delayed emptying. 2.  Coronary atherosclerosis: On Plavix. 3.  Malnutrition/weight loss:  She estimates 50 pounds in 6 months. 4.  Abdominal pain: Question relation to abnormal CT with abnormal lymph node in the fat-containing umbilical hernia and abnormal right middle lung lobe.  They have decided NOT to do LN biopsy after speaking with IR.  1.  Continue  twice daily PPI 2.  Follow-up UGI results. 3.  Again consider pulmonary consultation to interpret abnormal CT findings.   LOS: 1 day   Laban Emperor. Zehr  11/08/2017, 8:59 AM

## 2017-11-08 NOTE — H&P (Signed)
Chief Complaint: Peri-umbilical pain   Referring Physician(s): Georgena Spurling  Supervising Physician: Sandi Mariscal  Patient Status: [facility] - In-pt  History of Present Illness: [name] is a 84 y.o. female with medical history significant of DM 2, CVA left-sided weakness, history of CAD, hypertension, hyperlipidemia, CKD with creatinine of 2, combined systolic diastolic CHF   Presented with  1 week hx  Vomiting periumbilical pain reports normal BM, has ben loosing weight due to decreased appetite.  CT showed a lymph node within the fat containing umbilical hernia. In the absence of other lymph nodes/lymphadenopathy, this may be reactive, however, a node at this site has been associated with GI/intra-abdominal malignancy (sister [name] node).  We are asked to evaluate her for image guided needle biopsy of the lymph node.  She is NPO since 8 am. No blood thinners.  Past Medical History:  Diagnosis Date  . Acute renal injury ([place]) 07/2016  . Anginal pain ([place]) 2004; 2011  . CAD (coronary artery disease)    status post stenting of the RCA status post CABG. (left internal mammary artery to  LAD, saphenous vein graft to second diagonal, saphenous vein  graft to obtuse marginal 1, saphenous vein graft to posterior  descending).  . Cardiomyopathy     Mildly reduced EF (45%).   . Chronic kidney disease (CKD), stage III (moderate) ([place])   . History of blood transfusion 1960s   "related to anemia"  . HTN (hypertension)   . Myocardial infarction ([place]) 2011  . Pleural effusion 2011   S/P "bypass"  . Shortness of breath   . Stroke ([place]) 09/19/2016   "left sided weakness" (09/20/2016)  . Type II diabetes mellitus ([place])     Past Surgical History:  Procedure Laterality Date  . CATARACT EXTRACTION W/ INTRAOCULAR LENS  IMPLANT, BILATERAL Bilateral   . CORONARY ANGIOPLASTY WITH STENT PLACEMENT  2004   "LAD & one of the circumflex"  . CORONARY ARTERY BYPASS GRAFT  2011   CABG X4      Allergies: Contrast media [iodinated diagnostic agents]  Medications: Prior to Admission medications   Medication Sig Start Date End Date Taking? Authorizing Provider  acetaminophen (TYLENOL) 500 MG tablet Take 500 mg by mouth every 6 (six) hours as needed for headache.    Yes [provider]  amLODipine (NORVASC) 10 MG tablet TAKE ONE TABLET BY MOUTH ONCE DAILY. 07/27/17  Yes Minus Breeding, MD  atorvastatin (LIPITOR) 40 MG tablet Take 1 tablet (40 mg total) by mouth daily. 04/25/17  Yes Garvin Fila, MD  cloNIDine (CATAPRES) 0.1 MG tablet Take 0.1 mg by mouth 2 (two) times daily.   Yes [provider]  clopidogrel (PLAVIX) 75 MG tablet Take 75 mg by mouth daily.   Yes [provider]  ferrous sulfate 220 (44 Fe) MG/5ML solution Take 10 mLs by mouth daily. 10/25/17  Yes [provider]  glipiZIDE (GLUCOTROL) 10 MG tablet Take 1 tablet (10 mg total) by mouth daily before breakfast. 10/06/16  Yes Angiulli, Lavon Paganini, PA-C  isosorbide mononitrate (IMDUR) 30 MG 24 hr tablet TAKE 1 TABLET BY MOUTH ONCE DAILY 09/22/17  Yes Minus Breeding, MD  metoprolol tartrate (LOPRESSOR) 50 MG tablet Take 50 mg by mouth 2 (two) times daily.   Yes [provider]  nitroGLYCERIN (NITROSTAT) 0.4 MG SL tablet Place 1 tablet (0.4 mg total) under the tongue every 5 (five) minutes x 3 doses as needed for chest pain. 10/08/16  Yes  Lyda Jester M, PA-C  cholecalciferol (VITAMIN D) 400 units TABS tablet Take 1 tablet (400 Units total) by mouth daily. Patient not taking: Reported on 11/04/2017 10/06/16   Angiulli, Lavon Paganini, PA-C  polyethylene glycol (MIRALAX / GLYCOLAX) packet Take 17 g by mouth daily. Patient not taking: Reported on 11/04/2017 10/16/16   Regalado, Jerald Kief A, MD  protein supplement shake (PREMIER PROTEIN) LIQD Take 325 mLs (11 oz total) by mouth daily. Patient not taking: Reported on 11/04/2017 10/15/16   Regalado, Jerald Kief A, MD  senna (SENOKOT) 8.6 MG TABS tablet  Take 1 tablet (8.6 mg total) by mouth daily. Patient not taking: Reported on 11/04/2017 10/16/16   Elmarie Shiley, MD     Family History  Problem Relation Age of Onset  . Diabetes Father 15  . Unexplained death Mother 8  . CAD Neg Hx     Social History   Socioeconomic History  . Marital status: Married    Spouse name: Not on file  . Number of children: 2  . Years of education: Not on file  . Highest education level: Not on file  Occupational History  . Occupation: Retired  Scientific laboratory technician  . Financial resource strain: Not on file  . Food insecurity:    Worry: Not on file    Inability: Not on file  . Transportation needs:    Medical: Not on file    Non-medical: Not on file  Tobacco Use  . Smoking status: Never Smoker  . Smokeless tobacco: Never Used  Substance and Sexual Activity  . Alcohol use: No  . Drug use: No  . Sexual activity: Not Currently  Lifestyle  . Physical activity:    Days per week: Not on file    Minutes per session: Not on file  . Stress: Not on file  Relationships  . Social connections:    Talks on phone: Not on file    Gets together: Not on file    Attends religious service: Not on file    Active member of club or organization: Not on file    Attends meetings of clubs or organizations: Not on file    Relationship status: Not on file  Other Topics Concern  . Not on file  Social History Narrative  . Not on file     Review of Systems: A 12 point ROS discussed and pertinent positives are indicated in the HPI above.  All other systems are negative.  Review of Systems  Vital Signs: BP (!) 155/66   Pulse 84   Temp 99.9 F (37.7 C)   Resp 20   Ht 5\' 3"  (1.6 m)   Wt 60.2 kg   SpO2 100%   BMI 23.51 kg/m   Physical Exam  Constitutional: She is oriented to person, place, and time.  Elderly, NAD  HENT:  Head: Normocephalic and atraumatic.  Eyes: EOM are normal.  Neck: Normal range of motion.  Cardiovascular: Normal rate, regular  rhythm and normal heart sounds.  Pulmonary/Chest: Effort normal and breath sounds normal.  Abdominal: Soft.  Musculoskeletal: Normal range of motion.  Neurological: She is alert and oriented to person, place, and time.  Skin: Skin is warm and dry.  Psychiatric: She has a normal mood and affect. Her behavior is normal. Judgment and thought content normal.  Vitals reviewed.   Imaging: Ct Abdomen Pelvis Wo Contrast  Result Date: 11/04/2017 CLINICAL DATA:  84 year old female with a history of abdominal pain and weight loss EXAM: CT ABDOMEN AND  PELVIS WITHOUT CONTRAST TECHNIQUE: Multidetector CT imaging of the abdomen and pelvis was performed following the standard protocol without IV contrast. COMPARISON:  06/30/2011 FINDINGS: Lower chest: Mixed ground-glass and nodularity of the lateral segment of the right middle lobe, new from the comparison. There are additional foci of the right lower lobe and left lower lobe of mixed ground-glass/nodularity. Coronary artery disease. Differential attenuation of the blood pool and the myocardium. Hiatal hernia. Hepatobiliary: Unremarkable liver.  Unremarkable gallbladder. Pancreas: Unremarkable pancreas Spleen: Unremarkable spleen Adrenals/Urinary Tract: Unremarkable appearance of the adrenal glands. No evidence of hydronephrosis of the right or left kidney. Vascular calcifications of the bilateral renal hila. Unremarkable course of the bilateral ureters. Unremarkable appearance of the urinary bladder. Nonobstructive stone at the inferior collecting system of the left kidney measures 2 mm. Stomach/Bowel: Hiatal hernia. Otherwise unremarkable stomach. Unremarkable small bowel with no abnormal distention. Moderate stool burden without transition point or evidence of obstruction. No inflammatory changes adjacent to the colon. Appendix is not visualized, however, no inflammatory changes are present adjacent to the cecum to indicate an appendicitis. Vascular/Lymphatic: Dense  calcifications of the abdominal aorta mesenteric arteries renal arteries and lower extremities. Reproductive: Fibroid uterus. Cystic lesion associated with the left adnexa measures 2.3 cm. This has enlarged slightly from the comparison CT of 2013. Other: Lymph node within the umbilical fat containing hernia, new from the comparison CT and measures 8 mm. No intra-abdominal lymphadenopathy. No inguinal adenopathy. No pelvic adenopathy. Musculoskeletal: Multilevel degenerative changes of the thoracolumbar spine, with vacuum disc phenomenon extending from L1-S1 and associated broad-based disc bulges. No acute displaced fracture identified. Degenerative changes of the hips. IMPRESSION: No acute CT finding of the abdomen. Evidence of anemia. Of interest, there is a new lymph node within the fat containing umbilical hernia. In the absence of other lymph nodes/lymphadenopathy, this may be reactive, however, a node at this site has been associated with GI/intra-abdominal malignancy (sister [name] node), and referral for primary care valuation and review of health screening tests such as colonoscopy recommended. Cystic lesion of the left adnexa measures 2.3 cm. Referral for gynecologic evaluation and consideration of initiating ultrasound surveillance recommended, as this could potentially represent a GYN malignancy in a patient of this age. Hiatal hernia. Nonobstructive left nephrolithiasis. Aortic Atherosclerosis (ICD10-I70.0). Associated coronary artery disease, mesenteric artery disease, renal artery disease, lower extremity arterial disease. Electronically Signed   By: Corrie Mckusick D.O.   On: 11/04/2017 12:17   Dg Chest 2 View  Result Date: 11/04/2017 CLINICAL DATA:  Abnormality shown on previous CT scan today; hx CAD, HTN, pleural effusion; non smoker' diabetic EXAM: CHEST - 2 VIEW COMPARISON:  none FINDINGS: Lungs are clear. Heart size and mediastinal contours are within normal limits. Previous CABG. Aortic  Atherosclerosis (ICD10-170.0). No effusion. Visualized bones unremarkable, post median sternotomy. IMPRESSION: No acute cardiopulmonary disease, post CABG. Electronically Signed   By: Lucrezia Europe M.D.   On: 11/04/2017 13:57   Dg Abd 1 View  Result Date: 11/07/2017 CLINICAL DATA:  84 year old female. Post esophagram. Possible upper GI series. Subsequent encounter. EXAM: ABDOMEN - 1 VIEW COMPARISON:  11/06/2017 esophagram.  11/04/2017 CT. FINDINGS: Residual contrast throughout the colon. Scoliosis lumbar spine convex right with superimposed degenerative changes. Post CABG. IMPRESSION: Residual contrast throughout the colon. Electronically Signed   By: Genia Del M.D.   On: 11/07/2017 12:16   Dg Esophagus  Result Date: 11/06/2017 CLINICAL DATA:  Dysphagia.  Nausea vomiting EXAM: ESOPHOGRAM/BARIUM SWALLOW TECHNIQUE: Single contrast examination was performed using  thin barium. FLUOROSCOPY TIME:  Fluoroscopy Time:  1 minutes 48 seconds Radiation Exposure Index (if provided by the fluoroscopic device): Number of Acquired Spot Images: 0 COMPARISON:  CT abdomen pelvis 11/04/2017 FINDINGS: Poor esophageal motility. Esophagus diffusely dilated with poor motility. No stricture or mass. PET Moderately large hiatal hernia. Sliding hiatal hernia. Grossly normal stomach. Barium tablet not administered due to poor motility and retained barium throughout the esophagus. In addition, the patient was not able to stand upright. IMPRESSION: Moderately large hiatal hernia with poor esophageal motility. Negative for stricture or mass. Electronically Signed   By: Franchot Gallo M.D.   On: 11/06/2017 10:09    Labs:  CBC: Recent Labs    02/09/17 2011 11/04/17 1110 11/05/17 0514 11/08/17 0615  WBC 5.9 10.0 7.9 8.0  HGB 11.5* 9.6* 9.1* 7.6*  HCT 35.4* 30.2* 27.9* 24.5*  PLT 274 484* 456* 342    COAGS: Recent Labs    02/09/17 2011  INR 0.92    BMP: Recent Labs    02/09/17 2011 11/04/17 1110 11/05/17 0514  11/08/17 0528  NA 134* 133* 135 135  K 4.0 5.1 4.1 3.9  CL 103 93* 99 106  CO2 21* 27 24 21*  GLUCOSE 275* 356* 192* 109*  BUN 31* 14 13 7*  CALCIUM 10.0 10.3 9.8 9.1  CREATININE 2.07* 1.50* 1.49* 1.26*  GFRNONAA 20* 30* 30* 37*  GFRAA 24* 35* 35* 43*    LIVER FUNCTION TESTS: Recent Labs    11/04/17 1110 11/05/17 0514  BILITOT 0.9 1.1  AST 17 14*  ALT 10 9  ALKPHOS 70 59  PROT 8.0 6.8  ALBUMIN 2.9* 2.6*    TUMOR MARKERS: No results for input(s): AFPTM, CEA, CA199, CHROMGRNA in the last 8760 hours.  Assessment and Plan:  Peri-umbilical pain.  CT scan showed umbilical lymph node but no other lymphadenopathy.  I had a long discussion with the patient's son who is a Psychologist, sport and exercise.   Risks and benefits of the biopsy were discussed with the patient's son including, but not limited to bleeding, infection, damage to adjacent structures or low yield requiring additional tests.  He feels this biopsy is unnecessary and would be low yield.  Will hold off on biopsy of this umbilical node for now.  Thank you for this interesting consult.  I greatly enjoyed meeting [name] and look forward to participating in their care.  A copy of this report was sent to the requesting provider on this date.  Electronically Signed: Murrell Redden, PA-C   11/08/2017, 8:23 AM      I spent a total of 40 Minutes in face to face in clinical consultation, greater than 50% of which was counseling/coordinating care for biopsy of lymph node.

## 2017-11-09 ENCOUNTER — Telehealth: Payer: Self-pay

## 2017-11-09 ENCOUNTER — Inpatient Hospital Stay (HOSPITAL_COMMUNITY): Payer: Medicare Other

## 2017-11-09 DIAGNOSIS — I340 Nonrheumatic mitral (valve) insufficiency: Secondary | ICD-10-CM

## 2017-11-09 DIAGNOSIS — R7989 Other specified abnormal findings of blood chemistry: Secondary | ICD-10-CM

## 2017-11-09 DIAGNOSIS — R1114 Bilious vomiting: Secondary | ICD-10-CM

## 2017-11-09 DIAGNOSIS — R079 Chest pain, unspecified: Secondary | ICD-10-CM

## 2017-11-09 DIAGNOSIS — R9389 Abnormal findings on diagnostic imaging of other specified body structures: Secondary | ICD-10-CM

## 2017-11-09 DIAGNOSIS — R942 Abnormal results of pulmonary function studies: Secondary | ICD-10-CM

## 2017-11-09 LAB — ECHOCARDIOGRAM COMPLETE
HEIGHTINCHES: 63 in
Weight: 2065.6 oz

## 2017-11-09 LAB — BASIC METABOLIC PANEL
Anion gap: 9 (ref 5–15)
BUN: 9 mg/dL (ref 8–23)
CHLORIDE: 103 mmol/L (ref 98–111)
CO2: 22 mmol/L (ref 22–32)
Calcium: 9.2 mg/dL (ref 8.9–10.3)
Creatinine, Ser: 1.22 mg/dL — ABNORMAL HIGH (ref 0.44–1.00)
GFR, EST AFRICAN AMERICAN: 44 mL/min — AB (ref >60)
GFR, EST NON AFRICAN AMERICAN: 38 mL/min — AB (ref >60)
Glucose, Bld: 148 mg/dL — ABNORMAL HIGH (ref 70–99)
Potassium: 4.1 mmol/L (ref 3.5–5.1)
SODIUM: 134 mmol/L — AB (ref 135–145)

## 2017-11-09 LAB — CBC
HEMATOCRIT: 27 % — AB (ref 36.0–46.0)
Hemoglobin: 8.2 g/dL — ABNORMAL LOW (ref 12.0–15.0)
MCH: 26.4 pg (ref 26.0–34.0)
MCHC: 30.4 g/dL (ref 30.0–36.0)
MCV: 86.8 fL (ref 80.0–100.0)
NRBC: 0 % (ref 0.0–0.2)
PLATELETS: 372 10*3/uL (ref 150–400)
RBC: 3.11 MIL/uL — ABNORMAL LOW (ref 3.87–5.11)
RDW: 15.1 % (ref 11.5–15.5)
WBC: 13.2 10*3/uL — AB (ref 4.0–10.5)

## 2017-11-09 LAB — GLUCOSE, CAPILLARY
GLUCOSE-CAPILLARY: 148 mg/dL — AB (ref 70–99)
GLUCOSE-CAPILLARY: 209 mg/dL — AB (ref 70–99)
Glucose-Capillary: 102 mg/dL — ABNORMAL HIGH (ref 70–99)
Glucose-Capillary: 125 mg/dL — ABNORMAL HIGH (ref 70–99)
Glucose-Capillary: 127 mg/dL — ABNORMAL HIGH (ref 70–99)
Glucose-Capillary: 136 mg/dL — ABNORMAL HIGH (ref 70–99)
Glucose-Capillary: 168 mg/dL — ABNORMAL HIGH (ref 70–99)

## 2017-11-09 LAB — MAGNESIUM: MAGNESIUM: 1.5 mg/dL — AB (ref 1.7–2.4)

## 2017-11-09 LAB — TROPONIN I
TROPONIN I: 0.27 ng/mL — AB (ref <0.03)
TROPONIN I: 0.5 ng/mL — AB (ref <0.03)
Troponin I: 0.71 ng/mL (ref <0.03)

## 2017-11-09 LAB — BRAIN NATRIURETIC PEPTIDE: B Natriuretic Peptide: 1407.1 pg/mL — ABNORMAL HIGH (ref 0.0–100.0)

## 2017-11-09 MED ORDER — METOPROLOL TARTRATE 5 MG/5ML IV SOLN
INTRAVENOUS | Status: AC
  Administered 2017-11-09: 5 mg
  Filled 2017-11-09: qty 5

## 2017-11-09 MED ORDER — MAGNESIUM SULFATE 2 GM/50ML IV SOLN
2.0000 g | Freq: Once | INTRAVENOUS | Status: AC
  Administered 2017-11-09: 2 g via INTRAVENOUS
  Filled 2017-11-09: qty 50

## 2017-11-09 MED ORDER — LEVALBUTEROL HCL 1.25 MG/0.5ML IN NEBU
1.2500 mg | INHALATION_SOLUTION | Freq: Four times a day (QID) | RESPIRATORY_TRACT | Status: DC | PRN
  Administered 2017-11-09: 1.25 mg via RESPIRATORY_TRACT
  Filled 2017-11-09: qty 0.5

## 2017-11-09 MED ORDER — MORPHINE SULFATE (PF) 2 MG/ML IV SOLN: 2.0000 mg | INTRAVENOUS | Status: DC | PRN

## 2017-11-09 MED ORDER — FUROSEMIDE 10 MG/ML IJ SOLN
30.0000 mg | Freq: Once | INTRAMUSCULAR | Status: AC
  Administered 2017-11-09: 30 mg via INTRAVENOUS
  Filled 2017-11-09: qty 4

## 2017-11-09 MED ORDER — METOPROLOL TARTRATE 5 MG/5ML IV SOLN: 5.0000 mg | Freq: Once | INTRAVENOUS | Status: AC

## 2017-11-09 MED ORDER — NITROGLYCERIN 0.4 MG SL SUBL
0.4000 mg | SUBLINGUAL_TABLET | SUBLINGUAL | Status: DC | PRN
  Administered 2017-11-10 (×2): 0.4 mg via SUBLINGUAL
  Filled 2017-11-09 (×2): qty 1

## 2017-11-09 MED ORDER — ONDANSETRON HCL 4 MG/2ML IJ SOLN
4.0000 mg | Freq: Four times a day (QID) | INTRAMUSCULAR | Status: DC | PRN
  Administered 2017-11-15: 4 mg via INTRAVENOUS

## 2017-11-09 MED ORDER — ENOXAPARIN SODIUM 30 MG/0.3ML ~~LOC~~ SOLN: 30.0000 mg | SUBCUTANEOUS | Status: DC

## 2017-11-09 MED ORDER — ACETAMINOPHEN 325 MG PO TABS: 650.0000 mg | ORAL_TABLET | ORAL | Status: DC | PRN

## 2017-11-09 NOTE — Telephone Encounter (Signed)
Attempted to call patient, no answer, left message to call back.  

## 2017-11-09 NOTE — Progress Notes (Signed)
Gastroenterology Progress Note  CC:  Dysphagia and weight loss  Subjective:  Acute events O/N noted.  Patient complained of SOB and chest pain.  Also was tachy.  Rapid response called.  Troponins elevated and BNP very high.  UGI not performed yet due to large amount of retained contrast within the colon from esophagram on 10/7.  She had not moved her bowels in several days.  Said she had a large BM last night after a dose of Miralax and Senokot-S.  Says that she felt like she was going to die last night but no complaints this AM, not even complaining of burning chest/abdominal pain.  Objective:  Vital signs in last 24 hours: Temp:  [98.2 F (36.8 C)-99.5 F (37.5 C)] 98.6 F (37 C) (10/10 0749) Pulse Rate:  [89-133] 93 (10/10 0749) Resp:  [17-30] 17 (10/10 0749) BP: (134-174)/(65-133) 140/73 (10/10 0749) SpO2:  [94 %-100 %] 100 % (10/10 0749) Weight:  [58.6 kg-60.1 kg] 58.6 kg (10/10 0345) Last BM Date: 11/03/17 General:  Alert, Well-developed, in NAD Heart:  Regular rate and rhythm; no murmurs Pulm:  CTAB.  No increased WOB. Abdomen:  Soft, non-distended.  BS present.  Non-tender. Extremities:  Without edema. Neurologic:  Alert and oriented x 4;  grossly normal neurologically. Psych:  Alert and cooperative. Normal mood and affect.  Intake/Output from previous day: 10/09 0701 - 10/10 0700 In: 2905 [P.O.:480; I.V.:2315; IV Piggyback:110] Out: -   Lab Results: Recent Labs    11/08/17 0615 11/09/17 0600  WBC 8.0 13.2*  HGB 7.6* 8.2*  HCT 24.5* 27.0*  PLT 342 372   BMET Recent Labs    11/08/17 0528 11/09/17 0600  NA 135 134*  K 3.9 4.1  CL 106 103  CO2 21* 22  GLUCOSE 109* 148*  BUN 7* 9  CREATININE 1.26* 1.22*  CALCIUM 9.1 9.2   PT/INR Recent Labs    11/08/17 1643  LABPROT 13.3  INR 1.02   Dg Abd 1 View  Result Date: 11/08/2017 CLINICAL DATA:  Check for retained barium EXAM: ABDOMEN - 1 VIEW COMPARISON:  11/07/2017 FINDINGS: Contrast  material is again identified throughout the colon. No obstructive changes are seen. Degenerative changes of lumbar spine are again noted. No new focal abnormality is seen. IMPRESSION: Considerable retained contrast within the colon likely precluding adequate upper GI examination. Electronically Signed   By: Inez Catalina M.D.   On: 11/08/2017 17:10   Dg Abd 1 View  Result Date: 11/07/2017 CLINICAL DATA:  84 year old female. Post esophagram. Possible upper GI series. Subsequent encounter. EXAM: ABDOMEN - 1 VIEW COMPARISON:  11/06/2017 esophagram.  11/04/2017 CT. FINDINGS: Residual contrast throughout the colon. Scoliosis lumbar spine convex right with superimposed degenerative changes. Post CABG. IMPRESSION: Residual contrast throughout the colon. Electronically Signed   By: Genia Del M.D.   On: 11/07/2017 12:16   Dg Chest Port 1 View  Result Date: 11/09/2017 CLINICAL DATA:  Shortness of breath. History of hypertension and diabetes. Nonsmoker. EXAM: PORTABLE CHEST 1 VIEW COMPARISON:  11/04/2017 FINDINGS: Postoperative changes in the mediastinum. Cardiac enlargement. Perihilar interstitial infiltrates suggesting edema. No focal consolidation. No blunting of costophrenic angles. No pneumothorax. Calcified and tortuous aorta. IMPRESSION: Cardiac enlargement with perihilar edema. Electronically Signed   By: Lucienne Capers M.D.   On: 11/09/2017 01:13   Nm Gastric Emptying Solid Liquid Both W/sb Transit  Result Date: 11/08/2017 CLINICAL DATA:  Nausea and vomiting, history stroke, type II diabetes mellitus, coronary disease post MI,  hypertension EXAM: NUCLEAR MEDICINE GASTRIC EMPTYING SCAN TECHNIQUE: After oral ingestion of radiolabeled meal, sequential abdominal images were obtained for 4 hours. Percentage of activity emptying the stomach was calculated at 1 hour, 2 hour, 3 hour, and 4 hours. RADIOPHARMACEUTICALS:  2.1 mCi Tc-48m sulfur colloid in standardized meal COMPARISON:  None FINDINGS: Expected  location of the stomach in the left upper quadrant. Tracer empties the stomach gradually over the course of the study. 34% emptied at 1 hr ( normal >= 10%) 62% emptied at 2 hr ( normal >= 40%) 75% emptied at 3 hr ( normal >= 70%) 81% emptied at 4 hr ( normal >= 90%) IMPRESSION: Minimally decreased  gastric emptying. Electronically Signed   By: Lavonia Dana M.D.   On: 11/08/2017 13:14   Assessment / Plan: 1. Dysphagia:1 month of early satiety and daily nausea with unexplained dysphagia, swallowing study showing decreased motility and hiatal hernia which are likely contributing.  GES showed minimal delayed emptying. 2. Coronary atherosclerosis:On Plavix. 3. Malnutrition/weight loss:  She estimates 50 pounds in 6 months. 4. Abdominal pain:Question relation toabnormal CT with abnormal lymph node in the fat-containing umbilical hernia and abnormal right middle lung lobe.  They have decided NOT to do LN biopsy after speaking with IR.  1. Continue twice daily PPI 2. Follow-up UGI results.  She is receiving Senokot-S and Miralax.  Had a large BM last night.  Will plan for UGI in AM.  Can have full liquids today and then NPO after midnight. 3. Again consider pulmonary consultation to interpret abnormal CT findings. 4.  Consider contrasted cross-sectional imaging if there is a way to protect/preserve her renal function.   LOS: 2 days   Laban Emperor. Zehr  11/09/2017, 9:06 AM

## 2017-11-09 NOTE — Progress Notes (Signed)
PROGRESS NOTE    [name]  GHW:[id number] DOB: 07/13/1927 DOA: 11/04/2017 PCP: Deland Pretty, MD   Brief Narrative:  84 year old with a history of diabetes mellitus type 2, CVA with left-sided residual weakness, CAD, hypertension, hyperlipidemia, CKD stage III, combined systolic and diastolic congestive heart failure came to the hospital with complains of nausea and vomiting for about 1 week.  She admits of weight loss.  She has had very poor p.o. intake during this time.  Further dysphagia she had CT of the abdomen pelvis and barium esophagram done which were relatively unremarkable besides a large hiatal hernia.  CT scan did show lymph node in the fat containing umbilical hernia.  Started on PPI twice daily.  Was diagnosed with iron deficiency anemia therefore supplements were started along with bowel regimen.  Upper GI series is still pending.   Assessment & Plan:   Active Problems:   Hyperlipidemia   Essential hypertension   CORONARY ATHEROSCLEROSIS NATIVE CORONARY ARTERY   Diabetes mellitus with complication (HCC)   Hemiparesis affecting left side as late effect of cerebrovascular accident (CVA) ([place])   Stage 3 chronic kidney disease (HCC)   Malnutrition of moderate degree   Dehydration   Abdominal pain   Pressure injury of skin   Bilious vomiting with nausea   Enlarged lymph node   Dysphagia  Dysphagia, unknown etiology Moderate to severe protein calorie malnutrition Iron deficiency anemia - CT of the abdomen pelvis and barium esophagram is relatively unremarkable except large hiatal hernia.  Continue PPI twice daily -Upper GI series is still pending.  There is some retained contrast from previous esophagram.  Bowel regimen has been started due to complains of constipation. -Gastroenterology following - CEA levels within normal limits, Ca1 25 elevated at 64, CA 19-9 slightly elevated at 38 -IV iron started, will transition to oral when appropriate.  Bowel regimen  PRN  History of coronary artery disease Elevated troponin and sinus tachycardia -EKG shows sinus tachycardia at 103.  Troponin increased from 0.2-0.5.  Patient is currently chest pain-free.  Cardiology consulted for their input -Echocardiogram ordered for now. - Continue Plavix, statin, metoprolol in isosorbide mononitrate  Groundglass opacity in the right lung -Concern for possible aspiration.  I have discussed this with pulmonary, Dr. Lake Bells who will see the patient in consultation.  Essential hypertension, stable -Continue Norvasc 10 mg daily, clonidine 0.1 mg twice daily, metoprolol 25 mg twice daily, isosorbide mononitrate 30 mg daily  History of CVA status post residual left-sided weakness, stable - Continue Plavix and statin  Diabetes mellitus type 2, in acceptable range -Accu-Chek and sliding scale.  Home meds on hold  CKD stage III -Appears to be stable.  Baseline is around 1.4, today is 1.22  Chronic congestive diastolic and systolic heart failure -Ejection fraction 55 to 78%, grade 1 diastolic dysfunction in 9381.  Continue her home regimen of metoprolol and isosorbide mononitrate.  Stage II sacral pressure ulcer -Wound care per nursing staff  DVT prophylaxis: Lovenox Code Status: Full code Family Communication: None at bedside Disposition Plan: Maintain inpatient stay while patient is still undergoing evaluation for dysphagia.  She is still to get her upper GI series.  Consultants:  Gastroenterology Pulmonary Cardiology  Procedures:   None  Antimicrobials:   None   Subjective: Patient is feeling well this morning and does not have any chest complain but last night she developed pressure-like chest pain.  At that time EKG showed sinus tachycardia and her troponins were 0.27.  Chest x-ray was  suggestive of slight fluid overload.  BNP was elevated therefore was given Lasix 30 mg IV.  Review of Systems General = no fevers, chills, dizziness, malaise,  fatigue HEENT/EYES = negative for pain, redness, loss of vision, double vision, blurred vision, loss of hearing, sore throat, hoarseness, dysphagia Cardiovascular= negative for chest pain, palpitation, murmurs, lower extremity swelling Respiratory/lungs= negative for shortness of breath, cough, hemoptysis, wheezing, mucus production Gastrointestinal= negative for nausea, vomiting,, abdominal pain, melena, hematemesis Genitourinary= negative for Dysuria, Hematuria, Change in Urinary Frequency MSK = Negative for arthralgia, myalgias, Back Pain, Joint swelling  Neurology= Negative for headache, seizures, numbness, tingling  Psychiatry= Negative for anxiety, depression, suicidal and homocidal ideation Allergy/Immunology= Medication/Food allergy as listed  Skin= Negative for Rash, lesions, ulcers, itching   Objective: Vitals:   11/09/17 0345 11/09/17 0433 11/09/17 0749 11/09/17 1132  BP: (!) 152/81 (!) 144/70 140/73 134/62  Pulse: (!) 101 95 93 79  Resp: 19 18 17 18   Temp: 98.7 F (37.1 C) 98.4 F (36.9 C) 98.6 F (37 C) 98.5 F (36.9 C)  TempSrc:      SpO2: 97% 97% 100% 96%  Weight: 58.6 kg     Height:        Intake/Output Summary (Last 24 hours) at 11/09/2017 1138 Last data filed at 11/09/2017 0526 Gross per 24 hour  Intake 2904.97 ml  Output -  Net 2904.97 ml   Filed Weights   11/07/17 0900 11/08/17 1244 11/09/17 0345  Weight: 60.2 kg 60.1 kg 58.6 kg    Examination: Constitutional: NAD, calm, comfortable, elderly frail appearing.  Bilateral temporal wasting Eyes: PERRL, lids and conjunctivae normal ENMT: Mucous membranes are moist. Posterior pharynx clear of any exudate or lesions.Normal dentition.  Neck: normal, supple, no masses, no thyromegaly Respiratory: Bibasilar crackles Cardiovascular: Regular rate and rhythm, no murmurs / rubs / gallops. No extremity edema. 2+ pedal pulses. No carotid bruits.  Abdomen: no tenderness, no masses palpated. No hepatosplenomegaly.  Bowel sounds diminished Musculoskeletal: no clubbing / cyanosis. No joint deformity upper and lower extremities. Good ROM, no contractures. Normal muscle tone.  Skin: no rashes, lesions, ulcers. No induration Neurologic: CN 2-12 grossly intact. Sensation intact, DTR normal. Strength 4/5 in all 4.  Psychiatric: Normal judgment and insight. Alert and oriented x 3. Normal mood.     Data Reviewed:   CBC: Recent Labs  Lab 11/04/17 1110 11/05/17 0514 11/08/17 0615 11/09/17 0600  WBC 10.0 7.9 8.0 13.2*  HGB 9.6* 9.1* 7.6* 8.2*  HCT 30.2* 27.9* 24.5* 27.0*  MCV 83.9 84.3 86.9 86.8  PLT 484* 456* 342 099   Basic Metabolic Panel: Recent Labs  Lab 11/04/17 1110 11/05/17 0514 11/08/17 0528 11/09/17 0600  NA 133* 135 135 134*  K 5.1 4.1 3.9 4.1  CL 93* 99 106 103  CO2 27 24 21* 22  GLUCOSE 356* 192* 109* 148*  BUN 14 13 7* 9  CREATININE 1.50* 1.49* 1.26* 1.22*  CALCIUM 10.3 9.8 9.1 9.2  MG  --  1.8  --  1.5*  PHOS  --  2.5  --   --    GFR: Estimated Creatinine Clearance: 26.4 mL/min (A) (by C-G formula based on SCr of 1.22 mg/dL (H)). Liver Function Tests: Recent Labs  Lab 11/04/17 1110 11/05/17 0514  AST 17 14*  ALT 10 9  ALKPHOS 70 59  BILITOT 0.9 1.1  PROT 8.0 6.8  ALBUMIN 2.9* 2.6*   Recent Labs  Lab 11/04/17 1110  LIPASE 33   No results for  input(s): AMMONIA in the last 168 hours. Coagulation Profile: Recent Labs  Lab 11/08/17 1643  INR 1.02   Cardiac Enzymes: Recent Labs  Lab 11/09/17 0023 11/09/17 0600  TROPONINI 0.27* 0.50*   BNP (last 3 results) No results for input(s): PROBNP in the last 8760 hours. HbA1C: No results for input(s): HGBA1C in the last 72 hours. CBG: Recent Labs  Lab 11/08/17 2021 11/09/17 0029 11/09/17 0342 11/09/17 0746 11/09/17 1133  GLUCAP 204* 209* 168* 136* 125*   Lipid Profile: No results for input(s): CHOL, HDL, LDLCALC, TRIG, CHOLHDL, LDLDIRECT in the last 72 hours. Thyroid Function Tests: No results for  input(s): TSH, T4TOTAL, FREET4, T3FREE, THYROIDAB in the last 72 hours. Anemia Panel: Recent Labs    11/06/17 1742  VITAMINB12 209  FOLATE 6.2  FERRITIN 397*  TIBC 117*  IRON 12*  RETICCTPCT 1.8   Sepsis Labs: No results for input(s): PROCALCITON, LATICACIDVEN in the last 168 hours.  No results found for this or any previous visit (from the past 240 hour(s)).       Radiology Studies: Dg Abd 1 View  Result Date: 11/08/2017 CLINICAL DATA:  Check for retained barium EXAM: ABDOMEN - 1 VIEW COMPARISON:  11/07/2017 FINDINGS: Contrast material is again identified throughout the colon. No obstructive changes are seen. Degenerative changes of lumbar spine are again noted. No new focal abnormality is seen. IMPRESSION: Considerable retained contrast within the colon likely precluding adequate upper GI examination. Electronically Signed   By: Inez Catalina M.D.   On: 11/08/2017 17:10   Dg Abd 1 View  Result Date: 11/07/2017 CLINICAL DATA:  84 year old female. Post esophagram. Possible upper GI series. Subsequent encounter. EXAM: ABDOMEN - 1 VIEW COMPARISON:  11/06/2017 esophagram.  11/04/2017 CT. FINDINGS: Residual contrast throughout the colon. Scoliosis lumbar spine convex right with superimposed degenerative changes. Post CABG. IMPRESSION: Residual contrast throughout the colon. Electronically Signed   By: Genia Del M.D.   On: 11/07/2017 12:16   Dg Chest Port 1 View  Result Date: 11/09/2017 CLINICAL DATA:  Shortness of breath. History of hypertension and diabetes. Nonsmoker. EXAM: PORTABLE CHEST 1 VIEW COMPARISON:  11/04/2017 FINDINGS: Postoperative changes in the mediastinum. Cardiac enlargement. Perihilar interstitial infiltrates suggesting edema. No focal consolidation. No blunting of costophrenic angles. No pneumothorax. Calcified and tortuous aorta. IMPRESSION: Cardiac enlargement with perihilar edema. Electronically Signed   By: Lucienne Capers M.D.   On: 11/09/2017 01:13   Nm  Gastric Emptying Solid Liquid Both W/sb Transit  Result Date: 11/08/2017 CLINICAL DATA:  Nausea and vomiting, history stroke, type II diabetes mellitus, coronary disease post MI, hypertension EXAM: NUCLEAR MEDICINE GASTRIC EMPTYING SCAN TECHNIQUE: After oral ingestion of radiolabeled meal, sequential abdominal images were obtained for 4 hours. Percentage of activity emptying the stomach was calculated at 1 hour, 2 hour, 3 hour, and 4 hours. RADIOPHARMACEUTICALS:  2.1 mCi Tc-6m sulfur colloid in standardized meal COMPARISON:  None FINDINGS: Expected location of the stomach in the left upper quadrant. Tracer empties the stomach gradually over the course of the study. 34% emptied at 1 hr ( normal >= 10%) 62% emptied at 2 hr ( normal >= 40%) 75% emptied at 3 hr ( normal >= 70%) 81% emptied at 4 hr ( normal >= 90%) IMPRESSION: Minimally decreased  gastric emptying. Electronically Signed   By: Lavonia Dana M.D.   On: 11/08/2017 13:14        Scheduled Meds: . amLODipine  10 mg Oral Daily  . atorvastatin  40 mg Oral Daily  .  cloNIDine  0.1 mg Oral BID  . clopidogrel  75 mg Oral Daily  . enoxaparin (LOVENOX) injection  30 mg Subcutaneous Q24H  . feeding supplement (ENSURE ENLIVE)  237 mL Oral BID BM  . insulin aspart  0-9 Units Subcutaneous Q4H  . isosorbide mononitrate  30 mg Oral Daily  . metoprolol tartrate  25 mg Oral BID  . pantoprazole  40 mg Oral BID  . polyethylene glycol  17 g Oral Daily  . senna-docusate  1 tablet Oral BID   Continuous Infusions: . ferric gluconate (FERRLECIT/NULECIT) IV Stopped (11/08/17 1412)     LOS: 2 days   Time spent= 40 mins    Lahela Woodin Arsenio Loader, MD [facility] Pager [phone]   If 7PM-7AM, please contact night-coverage [email or web address] Password [email or web address] 11/09/2017, 11:38 AM

## 2017-11-09 NOTE — Consult Note (Addendum)
Cardiology Consultation:   [name] MRN: [id number]; DOB: Dec 11, 1927  Admit date: 11/04/2017 Date of Consult: 11/09/2017  Primary Care Provider: Deland Pretty, MD Primary Cardiologist: Dr. Percival [name]    Patient Profile:   [name] is a 84 y.o. female with hx of MI/CABG 2011, CKD III (followed by Dr [name]), HTN, hyperlipidemia, DM, CVA on 09/19/2016 w/ subacute infarction involving the right posterior limb of internal capsule and lateral thalamus who is being seen today for the evaluation of chest pain and SOB at the request of Dr. Reesa [name].   Last echocardiogram August 2018 showed LV function of 55 to 73%, grade 1 diastolic dysfunction, mild mitral regurgitation and mild left atrial dilation.  She was doing well from cardiac standpoint when last seen by Dr. Percival [name] 03/2017.  History of Present Illness:   [name] admitted 11/04/2017 with 1 week history of nausea, vomiting, umbilical pain and dysphasia. CT of the abdomen pelvis and barium esophagram showed large hiatal hernia and lymph node in the fat containing umbilical hernia.  Seen by GI and started on PPI and iron supplement. UGI not performed yet due to large amount of retained contrast within the colon from esophagram on 10/7.  Last night patient had an episode of chest pain and cardiology is called for consultation.  Today patient states that she had a constant burning sensation at the epigastric area radiating to her throat.  However, she had chest pressure with shortness of breath last night.  Episode lasted for 10 minutes.  Described 8 out of 10 chest pressure.  She was tachycardic.  Given IV metoprolol 5 mg.  EKG shows sinus tachycardia at rate of 130 bpm-personally reviewed.  Rate improved currently 80s to 90s.  No recurrent episodes since then.  BNP 1407.  Elevated troponin 0.27>>0.5. Chest radiograph shows cardiomegaly with perihilar/interstitial edema.   Ref. Range 11/04/2017 11:10 11/05/2017 05:14 11/08/2017 06:15  11/09/2017 06:00  Hemoglobin Latest Ref Range: 12.0 - 15.0 g/dL 9.6 (L) 9.1 (L) 7.6 (L) 8.2 (L)    She states that her episode last night was similar to her prior MI but very less intense.  She lives independently but has help from son and daughter.  She wants to be a full code but denies invasive study.  Denies exertional symptoms prior to presentation.  Past Medical History:  Diagnosis Date  . Acute renal injury ([place]) 07/2016  . Anginal pain ([place]) 2004; 2011  . CAD (coronary artery disease)    status post stenting of the RCA status post CABG. (left internal mammary artery to  LAD, saphenous vein graft to second diagonal, saphenous vein  graft to obtuse marginal 1, saphenous vein graft to posterior  descending).  . Cardiomyopathy     Mildly reduced EF (45%).   . Chronic kidney disease (CKD), stage III (moderate) ([place])   . History of blood transfusion 1960s   "related to anemia"  . HTN (hypertension)   . Myocardial infarction ([place]) 2011  . Pleural effusion 2011   S/P "bypass"  . Shortness of breath   . Stroke ([place]) 09/19/2016   "left sided weakness" (09/20/2016)  . Type II diabetes mellitus ([place])     Past Surgical History:  Procedure Laterality Date  . CATARACT EXTRACTION W/ INTRAOCULAR LENS  IMPLANT, BILATERAL Bilateral   . CORONARY ANGIOPLASTY WITH STENT PLACEMENT  2004   "LAD & one of the circumflex"  . CORONARY ARTERY BYPASS GRAFT  2011   CABG X4     Inpatient Medications:  Scheduled Meds: . amLODipine  10 mg Oral Daily  . atorvastatin  40 mg Oral Daily  . cloNIDine  0.1 mg Oral BID  . clopidogrel  75 mg Oral Daily  . enoxaparin (LOVENOX) injection  30 mg Subcutaneous Q24H  . feeding supplement (ENSURE ENLIVE)  237 mL Oral BID BM  . insulin aspart  0-9 Units Subcutaneous Q4H  . isosorbide mononitrate  30 mg Oral Daily  . metoprolol tartrate  25 mg Oral BID  . pantoprazole  40 mg Oral BID  . polyethylene glycol  17 g Oral Daily  . senna-docusate  1 tablet Oral BID    Continuous Infusions: . ferric gluconate (FERRLECIT/NULECIT) IV Stopped (11/08/17 1412)   PRN Meds: acetaminophen **OR** acetaminophen, alum & mag hydroxide-simeth, HYDROcodone-acetaminophen, levalbuterol, morphine injection, nitroGLYCERIN, ondansetron **OR** ondansetron (ZOFRAN) IV, ondansetron (ZOFRAN) IV  Allergies:    Allergies  Allergen Reactions  . Contrast Media [Iodinated Diagnostic Agents]     Sensitivity, kidney issues    Social History:   Social History   Socioeconomic History  . Marital status: Married    Spouse name: Not on file  . Number of children: 2  . Years of education: Not on file  . Highest education level: Not on file  Occupational History  . Occupation: Retired  Scientific laboratory technician  . Financial resource strain: Not on file  . Food insecurity:    Worry: Not on file    Inability: Not on file  . Transportation needs:    Medical: Not on file    Non-medical: Not on file  Tobacco Use  . Smoking status: Never Smoker  . Smokeless tobacco: Never Used  Substance and Sexual Activity  . Alcohol use: No  . Drug use: No  . Sexual activity: Not Currently  Lifestyle  . Physical activity:    Days per week: Not on file    Minutes per session: Not on file  . Stress: Not on file  Relationships  . Social connections:    Talks on phone: Not on file    Gets together: Not on file    Attends religious service: Not on file    Active member of club or organization: Not on file    Attends meetings of clubs or organizations: Not on file    Relationship status: Not on file  . Intimate partner violence:    Fear of current or ex partner: Not on file    Emotionally abused: Not on file    Physically abused: Not on file    Forced sexual activity: Not on file  Other Topics Concern  . Not on file  Social History Narrative  . Not on file    Family History:   Family History  Problem Relation Age of Onset  . Diabetes Father 8  . Unexplained death Mother 62  . CAD Neg  Hx      ROS:  Please see the history of present illness.  All other ROS reviewed and negative.     Physical Exam/Data:   Vitals:   11/09/17 0239 11/09/17 0345 11/09/17 0433 11/09/17 0749  BP: 134/65 (!) 152/81 (!) 144/70 140/73  Pulse: 89 (!) 101 95 93  Resp: 19 19 18 17   Temp: 99 F (37.2 C) 98.7 F (37.1 C) 98.4 F (36.9 C) 98.6 F (37 C)  TempSrc:      SpO2: 100% 97% 97% 100%  Weight:  58.6 kg    Height:        Intake/Output Summary (  Last 24 hours) at 11/09/2017 1102 Last data filed at 11/09/2017 0526 Gross per 24 hour  Intake 2904.97 ml  Output -  Net 2904.97 ml   Filed Weights   11/07/17 0900 11/08/17 1244 11/09/17 0345  Weight: 60.2 kg 60.1 kg 58.6 kg   Body mass index is 22.87 kg/m.  General:  Well nourished, well developed, in no acute distress HEENT: normal Lymph: no adenopathy Neck: no JVD Endocrine:  No thryomegaly Vascular: No carotid bruits; FA pulses 2+ bilaterally without bruits  Cardiac:  normal S1, S2; RRR; no murmur  Lungs:  clear to auscultation bilaterally, no wheezing, rhonchi or rales  Abd: soft, nontender, no hepatomegaly  Ext: no edema Musculoskeletal:  No deformities, BUE and BLE strength normal and equal Skin: warm and dry  Neuro:  CNs 2-12 intact, no focal abnormalities noted Psych:  Normal affect    Telemetry:  Telemetry was personally reviewed and demonstrates:  Sr at rate of 80-90s  Relevant CV Studies:  Echo 09/21/16 Study Conclusions  - Left ventricle: The cavity size was normal. Wall thickness was   increased in a pattern of mild LVH. Systolic function was normal.   The estimated ejection fraction was in the range of 55% to 60%.   Wall motion was normal; there were no regional wall motion   abnormalities. Doppler parameters are consistent with abnormal   left ventricular relaxation (grade 1 diastolic dysfunction). - Mitral valve: There was mild regurgitation. - Left atrium: The atrium was mildly dilated. - Atrial  septum: No defect or patent foramen ovale was identified.  Impressions:  - No cardiac source of emboli was indentified.  Laboratory Data:  Chemistry Recent Labs  Lab 11/05/17 0514 11/08/17 0528 11/09/17 0600  NA 135 135 134*  K 4.1 3.9 4.1  CL 99 106 103  CO2 24 21* 22  GLUCOSE 192* 109* 148*  BUN 13 7* 9  CREATININE 1.49* 1.26* 1.22*  CALCIUM 9.8 9.1 9.2  GFRNONAA 30* 37* 38*  GFRAA 35* 43* 44*  ANIONGAP 12 8 9     Recent Labs  Lab 11/04/17 1110 11/05/17 0514  PROT 8.0 6.8  ALBUMIN 2.9* 2.6*  AST 17 14*  ALT 10 9  ALKPHOS 70 59  BILITOT 0.9 1.1   Hematology Recent Labs  Lab 11/05/17 0514 11/06/17 1742 11/08/17 0615 11/09/17 0600  WBC 7.9  --  8.0 13.2*  RBC 3.31* 3.35* 2.82* 3.11*  HGB 9.1*  --  7.6* 8.2*  HCT 27.9*  --  24.5* 27.0*  MCV 84.3  --  86.9 86.8  MCH 27.5  --  27.0 26.4  MCHC 32.6  --  31.0 30.4  RDW 14.8  --  15.0 15.1  PLT 456*  --  342 372   Cardiac Enzymes Recent Labs  Lab 11/09/17 0023 11/09/17 0600  TROPONINI 0.27* 0.50*   No results for input(s): TROPIPOC in the last 168 hours.  BNP Recent Labs  Lab 11/09/17 0023  BNP 1,407.1*    DDimer No results for input(s): DDIMER in the last 168 hours.  Radiology/Studies:  Dg Abd 1 View  Result Date: 11/08/2017 CLINICAL DATA:  Check for retained barium EXAM: ABDOMEN - 1 VIEW COMPARISON:  11/07/2017 FINDINGS: Contrast material is again identified throughout the colon. No obstructive changes are seen. Degenerative changes of lumbar spine are again noted. No new focal abnormality is seen. IMPRESSION: Considerable retained contrast within the colon likely precluding adequate upper GI examination. Electronically Signed   By: Linus Mako.D.  On: 11/08/2017 17:10   Dg Abd 1 View  Result Date: 11/07/2017 CLINICAL DATA:  84 year old female. Post esophagram. Possible upper GI series. Subsequent encounter. EXAM: ABDOMEN - 1 VIEW COMPARISON:  11/06/2017 esophagram.  11/04/2017 CT. FINDINGS:  Residual contrast throughout the colon. Scoliosis lumbar spine convex right with superimposed degenerative changes. Post CABG. IMPRESSION: Residual contrast throughout the colon. Electronically Signed   By: Genia Del M.D.   On: 11/07/2017 12:16   Dg Esophagus  Result Date: 11/06/2017 CLINICAL DATA:  Dysphagia.  Nausea vomiting EXAM: ESOPHOGRAM/BARIUM SWALLOW TECHNIQUE: Single contrast examination was performed using  thin barium. FLUOROSCOPY TIME:  Fluoroscopy Time:  1 minutes 48 seconds Radiation Exposure Index (if provided by the fluoroscopic device): Number of Acquired Spot Images: 0 COMPARISON:  CT abdomen pelvis 11/04/2017 FINDINGS: Poor esophageal motility. Esophagus diffusely dilated with poor motility. No stricture or mass. PET Moderately large hiatal hernia. Sliding hiatal hernia. Grossly normal stomach. Barium tablet not administered due to poor motility and retained barium throughout the esophagus. In addition, the patient was not able to stand upright. IMPRESSION: Moderately large hiatal hernia with poor esophageal motility. Negative for stricture or mass. Electronically Signed   By: Franchot Gallo M.D.   On: 11/06/2017 10:09   Dg Chest Port 1 View  Result Date: 11/09/2017 CLINICAL DATA:  Shortness of breath. History of hypertension and diabetes. Nonsmoker. EXAM: PORTABLE CHEST 1 VIEW COMPARISON:  11/04/2017 FINDINGS: Postoperative changes in the mediastinum. Cardiac enlargement. Perihilar interstitial infiltrates suggesting edema. No focal consolidation. No blunting of costophrenic angles. No pneumothorax. Calcified and tortuous aorta. IMPRESSION: Cardiac enlargement with perihilar edema. Electronically Signed   By: Lucienne Capers M.D.   On: 11/09/2017 01:13   Nm Gastric Emptying Solid Liquid Both W/sb Transit  Result Date: 11/08/2017 CLINICAL DATA:  Nausea and vomiting, history stroke, type II diabetes mellitus, coronary disease post MI, hypertension EXAM: NUCLEAR MEDICINE GASTRIC  EMPTYING SCAN TECHNIQUE: After oral ingestion of radiolabeled meal, sequential abdominal images were obtained for 4 hours. Percentage of activity emptying the stomach was calculated at 1 hour, 2 hour, 3 hour, and 4 hours. RADIOPHARMACEUTICALS:  2.1 mCi Tc-38m sulfur colloid in standardized meal COMPARISON:  None FINDINGS: Expected location of the stomach in the left upper quadrant. Tracer empties the stomach gradually over the course of the study. 34% emptied at 1 hr ( normal >= 10%) 62% emptied at 2 hr ( normal >= 40%) 75% emptied at 3 hr ( normal >= 70%) 81% emptied at 4 hr ( normal >= 90%) IMPRESSION: Minimally decreased  gastric emptying. Electronically Signed   By: Lavonia Dana M.D.   On: 11/08/2017 13:14   Assessment and Plan:   1. Chest pain/Elevated troponin - She had constant burning epigastric pain since admission however chest pressure last night reminded her of prior MI but "very less". She does not want any testing or invasive procedure but wants to be a full code.  This is likely type 2 NSTEMI setting of anemia and tachycardia.  She is chest pain-free since last night.  No heparin given anemia. .  She is on Plavix for CVA..  2.  Anemia -Per primary team  3.  Elevated BNP -Last echocardiogram August 2018 showed LV function of 55 to 19%, grade 1 diastolic dysfunction, mild mitral regurgitation and mild left atrial dilation. -Chest  x-ray showed interstitial edema. - Given IV lasix 30mg  x 1 overnight.  volume OK   4. Abnormal Ct  - Lymph node in the fat containing umbilical  hernia. - Per primary team  5. Dysphagia - PER GI  6. HTN - BP improving.     For questions or updates, please contact [facility] Please consult [email or web address] for contact info under     Signed, Leanor Kail, PA  11/09/2017 11:02 AM    Pt seen and examined   I have reviewed findings of B Bhagat above Pt is an 84 yo with known CAD (s/p CABG in 2011), CVA, DM, HL   Normal LVEF in Aug  2018 Patient admitted on 10/5 with N/V and abdominal pain.  GI following   Started PPI   Called to see for CP   Pain like prior cardiac pain.  Given lasix x1  Today had chest burning    8/10   She was tachycardiac at time (ST)   Given IV metoprolol with resolution. Pt currently pain free  ON exam, pt is comfortable lying in bed Neck:  JVP is normal Lungs are relatively clear     Cardiac exam  RRR   No S3   No murmurs    Ext are without edema  Labs signif for BNP of 1407  Trop 0.27, 0.5.   Hgb 7.6   Now 8.2     CXR with cardiomeg and edema   Impression Chest pain may represent ischemia/angina in setting of anemia, tachycardia Has resolved Pt does not want any invasive Rx    I would treat medically     COntinue amlodipine, clonidine, clopidigrel, metoprolol Keep Hgb greater than 8      Will continue to follow with you .   Echo just done   Will review.  Dorris Carnes

## 2017-11-09 NOTE — Progress Notes (Signed)
   11/09/17 0000  MEWS Score  Resp (!) 30  Pulse Rate (!) 133  BP (!) 172/133  Temp 98.2 F (36.8 C)  SpO2 95 %  O2 Device Room Air  MEWS Score  MEWS RR 2  MEWS Pulse 3  MEWS Systolic 0  MEWS LOC 0  MEWS Temp 0  MEWS Score 5  MEWS Score Color Red  MEWS Assessment  Is this an acute change? Yes  MEWS guidelines implemented *See Row Information* Red  Rapid Response Notification  Name of Rapid Response RN Notified Gilmer Mor RN  Date Rapid Response Notified 11/09/17  Time Rapid Response Notified 0011  Provider Notification  Provider Name/Title Olevia Bowens MD  Date Provider Notified 11/09/17  Time Provider Notified 0000  Notification Type Page  Notification Reason Change in status  Response See new orders  Date of Provider Response 11/09/17  Time of Provider Response 0004

## 2017-11-09 NOTE — Consult Note (Signed)
PULMONARY / CRITICAL CARE MEDICINE   NAME:  [name], MRN:  [id number], DOB:  10/26/1927, LOS: 2 ADMISSION DATE:  11/04/2017, CONSULTATION DATE:  11/09/17 REFERRING MD:  Reesa Chew, CHIEF COMPLAINT:  Weight loss  BRIEF HISTORY:    84 year old female with nausea vomiting dysphasia and an abnormal CT scan of the chest  HISTORY OF PRESENT ILLNESS   This is a pleasant 84 year old female who was admitted for a 50 pound weight loss, malaise, and poor p.o. intake for several months.  Pulmonary and critical care medicine was consulted because of the discovery of unexplained groundglass abnormalities in the bases of her lungs.  She tells me that she has no problems breathing.  She will have a cough from time to time and she does sometimes produce mucus.  However, her primary complaint is nausea, vomiting, poor appetite.  She says that she will eat a meal with some difficulty, often has difficulty chewing the food well enough.  She says that she will swallow it and then frequently will vomit the entire meal back.  She says this is been going on for several months.  In addition of this she has no desire to eat food.  She says she has lost about 50 pounds over the last 6 months.  She was admitted to our facility and she has been receiving IV volume resuscitation and GI medicine has been consulted.  Pulmonary was consulted because of the discovery of groundglass abnormalities.  SIGNIFICANT PAST MEDICAL HISTORY    Past Medical History:  Diagnosis Date  . Acute renal injury ([place]) 07/2016  . Anginal pain ([place]) 2004; 2011  . CAD (coronary artery disease)    status post stenting of the RCA status post CABG. (left internal mammary artery to  LAD, saphenous vein graft to second diagonal, saphenous vein  graft to obtuse marginal 1, saphenous vein graft to posterior  descending).  . Cardiomyopathy     Mildly reduced EF (45%).   . Chronic kidney disease (CKD), stage III (moderate) ([place])   . History of blood transfusion  1960s   "related to anemia"  . HTN (hypertension)   . Myocardial infarction ([place]) 2011  . Pleural effusion 2011   S/P "bypass"  . Shortness of breath   . Stroke ([place]) 09/19/2016   "left sided weakness" (09/20/2016)  . Type II diabetes mellitus ([place])      SIGNIFICANT EVENTS:   STUDIES:   November 04, 2017 CT abdomen: Lung windows showed nonspecific patchy groundglass in the bases of both lungs, there is a new lymph node in her umbilical hernia and a cystic lesion of the left adnexa was 2.3 cm. CULTURES:    ANTIBIOTICS:    LINES/TUBES:    CONSULTANTS:  GI medicine SUBJECTIVE:  As above  CONSTITUTIONAL: BP 134/62   Pulse 79   Temp 98.5 F (36.9 C)   Resp 18   Ht 5\' 3"  (1.6 m)   Wt 58.6 kg   SpO2 96%   BMI 22.87 kg/m   I/O last 3 completed shifts: In: 2905 [P.O.:480; I.V.:2315; IV Piggyback:110] Out: 800 [Urine:800]        PHYSICAL EXAM: Gen: cachectic, chronically ill appearing, no acute distress HENT: NCAT, OP clear, neck supple without masses Eyes: PERRL, EOMi Lymph: no cervical lymphadenopathy PULM: CTA B CV: RRR, no mgr, no JVD GI: BS+, soft, nontender, no hsm Derm: no rash or skin breakdown MSK: normal bulk and tone Neuro: speech slurred but alert and oriented, follows commands Psyche: normal mood  and affect   ASSESSMENT AND PLAN   Groundglass opacities in the bases of her lungs: Given her history of nausea and vomiting and dysphasia this most likely represents aspiration pneumonitis.  At this time I do not recommend further evaluation but we do need to make sure that these clear.  She will need a repeat CT scan of her chest in 2 to 3 months.  She can follow-up with me in clinic and we can make arrangements for that.  Weight loss in the setting of ongoing nausea and vomiting: Agree with ongoing GI evaluation   Pulmonary and critical care medicine will sign off, call with questions   LABS  Glucose Recent Labs  Lab 11/08/17 1626  11/08/17 2021 11/09/17 0029 11/09/17 0342 11/09/17 0746 11/09/17 1133  GLUCAP 166* 204* 209* 168* 136* 125*    BMET Recent Labs  Lab 11/05/17 0514 11/08/17 0528 11/09/17 0600  NA 135 135 134*  K 4.1 3.9 4.1  CL 99 106 103  CO2 24 21* 22  BUN 13 7* 9  CREATININE 1.49* 1.26* 1.22*  GLUCOSE 192* 109* 148*    Liver Enzymes Recent Labs  Lab 11/04/17 1110 11/05/17 0514  AST 17 14*  ALT 10 9  ALKPHOS 70 59  BILITOT 0.9 1.1  ALBUMIN 2.9* 2.6*    Electrolytes Recent Labs  Lab 11/05/17 0514 11/08/17 0528 11/09/17 0600  CALCIUM 9.8 9.1 9.2  MG 1.8  --  1.5*  PHOS 2.5  --   --     CBC Recent Labs  Lab 11/05/17 0514 11/08/17 0615 11/09/17 0600  WBC 7.9 8.0 13.2*  HGB 9.1* 7.6* 8.2*  HCT 27.9* 24.5* 27.0*  PLT 456* 342 372    ABG No results for input(s): PHART, PCO2ART, PO2ART in the last 168 hours.  Coag's Recent Labs  Lab 11/08/17 1643  INR 1.02    Sepsis Markers No results for input(s): LATICACIDVEN, PROCALCITON, O2SATVEN in the last 168 hours.  Cardiac Enzymes Recent Labs  Lab 11/09/17 0023 11/09/17 0600 11/09/17 1131  TROPONINI 0.27* 0.50* 0.71*    PAST MEDICAL HISTORY :   She  has a past medical history of Acute renal injury ([place]) (07/2016), Anginal pain ([place]) (2004; 2011), CAD (coronary artery disease), Cardiomyopathy, Chronic kidney disease (CKD), stage III (moderate) ([place]), History of blood transfusion (1960s), HTN (hypertension), Myocardial infarction ([place]) (2011), Pleural effusion (2011), Shortness of breath, Stroke ([place]) (09/19/2016), and Type II diabetes mellitus ([place]).  PAST SURGICAL HISTORY:  She  has a past surgical history that includes Cataract extraction w/ intraocular lens  implant, bilateral (Bilateral); Coronary artery bypass graft (2011); and Coronary angioplasty with stent (2004).  Allergies  Allergen Reactions  . Contrast Media [Iodinated Diagnostic Agents]     Sensitivity, kidney issues    No current  facility-administered medications on file prior to encounter.    Current Outpatient Medications on File Prior to Encounter  Medication Sig  . acetaminophen (TYLENOL) 500 MG tablet Take 500 mg by mouth every 6 (six) hours as needed for headache.   Marland Kitchen amLODipine (NORVASC) 10 MG tablet TAKE ONE TABLET BY MOUTH ONCE DAILY.  Marland Kitchen atorvastatin (LIPITOR) 40 MG tablet Take 1 tablet (40 mg total) by mouth daily.  . cloNIDine (CATAPRES) 0.1 MG tablet Take 0.1 mg by mouth 2 (two) times daily.  . clopidogrel (PLAVIX) 75 MG tablet Take 75 mg by mouth daily.  . ferrous sulfate 220 (44 Fe) MG/5ML solution Take 10 mLs by mouth daily.  Marland Kitchen glipiZIDE (GLUCOTROL)  10 MG tablet Take 1 tablet (10 mg total) by mouth daily before breakfast.  . isosorbide mononitrate (IMDUR) 30 MG 24 hr tablet TAKE 1 TABLET BY MOUTH ONCE DAILY  . metoprolol tartrate (LOPRESSOR) 50 MG tablet Take 50 mg by mouth 2 (two) times daily.  . nitroGLYCERIN (NITROSTAT) 0.4 MG SL tablet Place 1 tablet (0.4 mg total) under the tongue every 5 (five) minutes x 3 doses as needed for chest pain.  . cholecalciferol (VITAMIN D) 400 units TABS tablet Take 1 tablet (400 Units total) by mouth daily. (Patient not taking: Reported on 11/04/2017)  . polyethylene glycol (MIRALAX / GLYCOLAX) packet Take 17 g by mouth daily. (Patient not taking: Reported on 11/04/2017)  . protein supplement shake (PREMIER PROTEIN) LIQD Take 325 mLs (11 oz total) by mouth daily. (Patient not taking: Reported on 11/04/2017)  . senna (SENOKOT) 8.6 MG TABS tablet Take 1 tablet (8.6 mg total) by mouth daily. (Patient not taking: Reported on 11/04/2017)    FAMILY HISTORY:   Her family history includes Diabetes (age of onset: 40) in her father; Unexplained death (age of onset: 66) in her mother. There is no history of CAD. No history of lung disease  SOCIAL HISTORY:  She  reports that she has never smoked. She has never used smokeless tobacco. She reports that she does not drink alcohol or use  drugs.  REVIEW OF SYSTEMS:    Gen: Denies fever, chills, + weight change, + fatigue, night sweats HEENT: Denies blurred vision, double vision, hearing loss, tinnitus, sinus congestion, rhinorrhea, sore throat, neck stiffness, dysphagia PULM: Denies shortness of breath, cough, sputum production, hemoptysis, wheezing CV: Denies chest pain, edema, orthopnea, paroxysmal nocturnal dyspnea, palpitations GI: per HPI GU: Denies dysuria, hematuria, polyuria, oliguria, urethral discharge Endocrine: Denies hot or cold intolerance, polyuria, polyphagia or appetite change Derm: Denies rash, dry skin, scaling or peeling skin change Heme: Denies easy bruising, bleeding, bleeding gums Neuro: Denies headache, numbness, weakness, slurred speech, loss of memory or consciousness

## 2017-11-09 NOTE — Significant Event (Signed)
Rapid Response Event Note  Overview: Time Called: 0012 Arrival Time: 0015 Event Type: Respiratory, Cardiac  Initial Focused Assessment:  Called in reference to SOB, Elevated HR, and Chest Pain.  Dr. Olevia Bowens had also been paged prior to my arrival.  I arrived to find patient laying in bed, stating that she "Can't Breath", RR 28-32.  Lungs sound clear upper but very diminished in lower lobes bilaterally.  Primary RN stated that she was having expiratory wheezing prior to calling me.  Called for respiratory therapist to give treatment.  Pt connected to monitor.  HR 138-142, ST (able to see P waves).  When asked the patient to point to where her chest hurts to points from epigastric region to lower throat area.  Described the pain as burning.  Unable to answer if taking a deep breath increases the pain or not.  12-lead EKG done.  It showed some ST changes, but didn't see anything that I needed to act immediately on.  SBP in the 170s, pt already had order for Lopressor 5mg  IV, this was given by primary RN.  As Lopressor was being gven, RT in room to start breathing treatment.  I noticed as I was about to put some orders in, that Dr. Olevia Bowens had already ordered a port CXR and Troponin.  CBG 209.  At [place], I was about to give pt a SL NTG, but pt stated that she was pain free.  HR down to 100-104, SBP down to 160s, RR down to 24.  No obvious distress at this time.  Pt also denies being currently SOB.  Dr. Olevia Bowens paged and notified of situation.  I advised him of the 12-lead EKG.  He advised to place patient on telemetry.  Pt current level of care was med-surg but the unit she is currently on can do telemetry.  I advised Dr. Olevia Bowens this and he placed telemetry orders and advised me that he would watch for the troponin level.    Interventions:  12-lead EKG, CBG, Vital Signs, RRT Assessment, Lopressor, Respiratory Treatment  Plan of Care (if not transferred):  Advised RN to call back if any changes or return of  chest pain.  RN advised that she was good with keeping patient.  Event Summary: Name of Physician Notified: Dr. Olevia Bowens at 1240    at    Outcome: Stayed in room and stabalized  Event End Time: [place] [facility] ICU[facility] RN IV / Care Coordinator / Rapid Response Nurse Rapid Response Number:  [phone]

## 2017-11-09 NOTE — Telephone Encounter (Signed)
-----   Message from Juanito Doom, MD sent at 11/09/2017  1:01 PM EDT ----- Hi,  Please make arrangements for her to follow up with me in 2-3 months for an abnormal CT chest, hospital follow up visit.  Ruby Cola

## 2017-11-09 NOTE — Progress Notes (Signed)
PT Cancellation Note  Patient Details Name: [name] MRN: [id number] DOB: 1928/01/17   Cancelled Treatment:    Reason Eval/Treat Not Completed: Medical issues which prohibited therapy Pt with elevated troponin.  Will await further plan of care per MD.  Will check back as schedule permits.   Urban Naval,KATHrine E 11/09/2017, 8:40 AM Carmelia Bake, PT, DPT Acute Rehabilitation Services Office: [phone] Pager: [phone]

## 2017-11-09 NOTE — Progress Notes (Signed)
  Echocardiogram 2D Echocardiogram has been performed.  Darlina Sicilian M 11/09/2017, 1:41 PM

## 2017-11-09 NOTE — Clinical Social Work Note (Signed)
Clinical Social Work Assessment  Patient Details  Name: [name] MRN: [id number] Date of Birth: 04-Jul-1927  Date of referral:  11/09/17               Reason for consult:  Facility Placement                Permission sought to share information with:  Family Supports Permission granted to share information::  No  Name::        Agency::     Relationship::     Contact Information:     Housing/Transportation Living arrangements for the past 2 months:  Single Family Home Source of Information:  Patient Patient Interpreter Needed:  None Criminal Activity/Legal Involvement Pertinent to Current Situation/Hospitalization:  No - Comment as needed Significant Relationships:  Adult Children Lives with:  Other (Comment)(Family) Do you feel safe going back to the place where you live?  Yes Need for family participation in patient care:  No (Coment)  Care giving concerns:  Patient reported that she is from home with family. Patient reported that prior to hospitalization she was independent with ADLs and used a walker and or cane at home. Patient has a "medical history significant of DM 2, CVA left-sided weakness, history of CAD,  hypertension, hyperlipidemia, CKD 3, combined systolic diastolic CHF; admitted with dehydration, wt loss, dysphagia". PT recommending SNF.   Social Worker assessment / plan:  CSW spoke with patient at bedside regarding discharge planning and PT recommendation for SNF. CSW explained SNF placement process, patient declined SNF and reported a plan to return home. CSW inquired about patient's interest in home health services, patient declined. Patient reported that she will return home with family. CSW inquired if there is any family that patient would like CSW to contact to discuss discharge planning, patient reported no.  Patient declined SNF. CSW signing off, no other needs identified at this time.  Employment status:  Retired Office manager PT Recommendations:  [place] / Referral to community resources:  Other (Comment Required)(Patient declined SNF or [place])  Patient/Family's Response to care:  Patient thanked CSW for visit.   Patient/Family's Understanding of and Emotional Response to Diagnosis, Current Treatment, and Prognosis:  Patient presented calm and did not go into detail regarding current treatment plan. Patient reported that they are still trying to figure out what is wrong with patient. Patient declined SNF and reported that she has a support system to assist in the home when she is discharged.   Emotional Assessment Appearance:  Appears stated age Attitude/Demeanor/Rapport:  Engaged Affect (typically observed):  Appropriate, Calm Orientation:  Oriented to Self, Oriented to Place, Oriented to  Time, Oriented to Situation Alcohol / Substance use:  Not Applicable Psych involvement (Current and /or in the community):  No (Comment)  Discharge Needs  Concerns to be addressed:  Care Coordination Readmission within the last 30 days:  No Current discharge risk:  Physical Impairment Barriers to Discharge:  Continued Medical Work up   [employer], LCSW 11/09/2017, 4:15 PM

## 2017-11-09 NOTE — Progress Notes (Signed)
OT Cancellation Note  Patient Details Name: [name] MRN: [id number] DOB: December 20, 1927   Cancelled Treatment:    Reason Eval/Treat Not Completed: Medical issues which prohibited therapy  [place], Mickel Baas, OT Acute Rehabilitation Services [phone] [phone]    11/09/2017, 12:43 PM

## 2017-11-09 NOTE — Progress Notes (Signed)
Night shift remote floor coverage note.  I was notified about the patient having chest pressure, tachycardia, dyspnea and wheezing.  I ordered supplemental oxygen, levalbuterol nebs, EKG, troponin, 5 mg of metoprolol IV, sublingual nitroglycerin and asked the staff to activate RRT.  EKG tracing is not available at this time on EMR.  However it showed tachycardia per staff. Her BNP was 1407.1 pg/mL and troponin 0 0.27 ng/mL.  Chest radiograph shows cardiomegaly with perihilar/interstitial edema.  09/21/2016 echocardiogram complete ------------------------------------------------------------------- LV EF: 55% -   60%  ------------------------------------------------------------------- Indications:      CVA 436.  ------------------------------------------------------------------- History:   PMH:   Dyspnea.  Coronary artery disease.  PMH: Myocardial infarction.  Risk factors:  Chronic Kidney Disease. Hypertension. Diabetes mellitus.  ------------------------------------------------------------------- Study Conclusions  - Left ventricle: The cavity size was normal. Wall thickness was   increased in a pattern of mild LVH. Systolic function was normal.   The estimated ejection fraction was in the range of 55% to 60%.   Wall motion was normal; there were no regional wall motion   abnormalities. Doppler parameters are consistent with abnormal   left ventricular relaxation (grade 1 diastolic dysfunction). - Mitral valve: There was mild regurgitation. - Left atrium: The atrium was mildly dilated. - Atrial septum: No defect or patent foramen ovale was identified.  Impressions:  - No cardiac source of emboli was indentified  Assessment:  Volume overload IV fluids held. Furosemide 30 mg IVP x1 dose now. Check echocardiogram later today.  Tennis Must, MD.

## 2017-11-10 ENCOUNTER — Inpatient Hospital Stay (HOSPITAL_COMMUNITY): Payer: Medicare Other

## 2017-11-10 LAB — BASIC METABOLIC PANEL
ANION GAP: 11 (ref 5–15)
BUN: 10 mg/dL (ref 8–23)
CO2: 25 mmol/L (ref 22–32)
Calcium: 9.8 mg/dL (ref 8.9–10.3)
Chloride: 102 mmol/L (ref 98–111)
Creatinine, Ser: 1.4 mg/dL — ABNORMAL HIGH (ref 0.44–1.00)
GFR calc Af Amer: 38 mL/min — ABNORMAL LOW (ref >60)
GFR, EST NON AFRICAN AMERICAN: 32 mL/min — AB (ref >60)
GLUCOSE: 112 mg/dL — AB (ref 70–99)
POTASSIUM: 3.8 mmol/L (ref 3.5–5.1)
Sodium: 138 mmol/L (ref 135–145)

## 2017-11-10 LAB — CBC
HCT: 25.3 % — ABNORMAL LOW (ref 36.0–46.0)
HCT: 26.6 % — ABNORMAL LOW (ref 36.0–46.0)
HEMOGLOBIN: 7.8 g/dL — AB (ref 12.0–15.0)
Hemoglobin: 8.2 g/dL — ABNORMAL LOW (ref 12.0–15.0)
MCH: 26.9 pg (ref 26.0–34.0)
MCH: 26.7 pg (ref 26.0–34.0)
MCHC: 30.8 g/dL (ref 30.0–36.0)
MCHC: 30.8 g/dL (ref 30.0–36.0)
MCV: 87.2 fL (ref 80.0–100.0)
MCV: 86.6 fL (ref 80.0–100.0)
PLATELETS: 410 10*3/uL — AB (ref 150–400)
Platelets: 387 10*3/uL (ref 150–400)
RBC: 2.9 MIL/uL — AB (ref 3.87–5.11)
RBC: 3.07 MIL/uL — AB (ref 3.87–5.11)
RDW: 15.3 % (ref 11.5–15.5)
RDW: 15.2 % (ref 11.5–15.5)
WBC: 8.5 10*3/uL (ref 4.0–10.5)
WBC: 8.2 10*3/uL (ref 4.0–10.5)
nRBC: 0 % (ref 0.0–0.2)
nRBC: 0 % (ref 0.0–0.2)

## 2017-11-10 LAB — MAGNESIUM: MAGNESIUM: 1.9 mg/dL (ref 1.7–2.4)

## 2017-11-10 LAB — GLUCOSE, CAPILLARY
GLUCOSE-CAPILLARY: 100 mg/dL — AB (ref 70–99)
GLUCOSE-CAPILLARY: 111 mg/dL — AB (ref 70–99)
GLUCOSE-CAPILLARY: 120 mg/dL — AB (ref 70–99)
GLUCOSE-CAPILLARY: 196 mg/dL — AB (ref 70–99)
Glucose-Capillary: 201 mg/dL — ABNORMAL HIGH (ref 70–99)

## 2017-11-10 LAB — TROPONIN I: TROPONIN I: 0.34 ng/mL — AB (ref <0.03)

## 2017-11-10 MED ORDER — FUROSEMIDE 10 MG/ML IJ SOLN
40.0000 mg | Freq: Once | INTRAMUSCULAR | Status: AC
  Administered 2017-11-10: 40 mg via INTRAVENOUS
  Filled 2017-11-10: qty 4

## 2017-11-10 NOTE — Progress Notes (Signed)
CRITICAL VALUE ALERT  Critical Value:  Trop 0.34  Date & Time Notied:  11/10/2017 1618  Provider Notified: Tawanna Solo MD via page  Orders Received/Actions taken: awaiting orders

## 2017-11-10 NOTE — Progress Notes (Addendum)
Progress Note  Patient Name: [name] Date of Encounter: 11/10/2017  Primary Cardiologist: Minus Breeding, MD   Subjective   Feeling better today. Denies CP. No events overnight.   Inpatient Medications    Scheduled Meds: . amLODipine  10 mg Oral Daily  . atorvastatin  40 mg Oral Daily  . cloNIDine  0.1 mg Oral BID  . clopidogrel  75 mg Oral Daily  . enoxaparin (LOVENOX) injection  30 mg Subcutaneous Q24H  . feeding supplement (ENSURE ENLIVE)  237 mL Oral BID BM  . insulin aspart  0-9 Units Subcutaneous Q4H  . isosorbide mononitrate  30 mg Oral Daily  . metoprolol tartrate  25 mg Oral BID  . pantoprazole  40 mg Oral BID  . polyethylene glycol  17 g Oral Daily  . senna-docusate  1 tablet Oral BID   Continuous Infusions: . ferric gluconate (FERRLECIT/NULECIT) IV Stopped (11/08/17 1412)   PRN Meds: acetaminophen **OR** acetaminophen, alum & mag hydroxide-simeth, HYDROcodone-acetaminophen, levalbuterol, morphine injection, nitroGLYCERIN, ondansetron **OR** ondansetron (ZOFRAN) IV, ondansetron (ZOFRAN) IV   Vital Signs    Vitals:   11/09/17 1626 11/09/17 1951 11/10/17 0421 11/10/17 0445  BP: 130/67 138/61 133/67   Pulse: 93 90 92   Resp: 18 18 20    Temp: 98.3 F (36.8 C) 99.4 F (37.4 C) 98 F (36.7 C)   TempSrc:  Oral    SpO2: 97% 97% 99%   Weight:    56.7 kg  Height:        Intake/Output Summary (Last 24 hours) at 11/10/2017 1007 Last data filed at 11/09/2017 1458 Gross per 24 hour  Intake 40 ml  Output 1201 ml  Net -1161 ml   Filed Weights   11/08/17 1244 11/09/17 0345 11/10/17 0445  Weight: 60.1 kg 58.6 kg 56.7 kg    Telemetry    SR 90s - Personally Reviewed  ECG    10/10 sinus tach 104- Personally Reviewed  Physical Exam   GEN: elderly BF in No acute distress.   Neck: No JVD Cardiac: RRR, no murmurs, rubs, or gallops.  Respiratory: Clear to auscultation bilaterally. GI: Soft, nontender, non-distended  MS: No edema; No  deformity. Neuro:  Nonfocal  Psych: Normal affect   Labs    Chemistry Recent Labs  Lab 11/04/17 1110 11/05/17 0514 11/08/17 0528 11/09/17 0600 11/10/17 0605  NA 133* 135 135 134* 138  K 5.1 4.1 3.9 4.1 3.8  CL 93* 99 106 103 102  CO2 27 24 21* 22 25  GLUCOSE 356* 192* 109* 148* 112*  BUN 14 13 7* 9 10  CREATININE 1.50* 1.49* 1.26* 1.22* 1.40*  CALCIUM 10.3 9.8 9.1 9.2 9.8  PROT 8.0 6.8  --   --   --   ALBUMIN 2.9* 2.6*  --   --   --   AST 17 14*  --   --   --   ALT 10 9  --   --   --   ALKPHOS 70 59  --   --   --   BILITOT 0.9 1.1  --   --   --   GFRNONAA 30* 30* 37* 38* 32*  GFRAA 35* 35* 43* 44* 38*  ANIONGAP 13 12 8 9 11      Hematology Recent Labs  Lab 11/08/17 0615 11/09/17 0600 11/10/17 0605  WBC 8.0 13.2* 8.5  RBC 2.82* 3.11* 2.90*  HGB 7.6* 8.2* 7.8*  HCT 24.5* 27.0* 25.3*  MCV 86.9 86.8 87.2  MCH 27.0  26.4 26.9  MCHC 31.0 30.4 30.8  RDW 15.0 15.1 15.3  PLT 342 372 387    Cardiac Enzymes Recent Labs  Lab 11/09/17 0023 11/09/17 0600 11/09/17 1131  TROPONINI 0.27* 0.50* 0.71*   No results for input(s): TROPIPOC in the last 168 hours.   BNP Recent Labs  Lab 11/09/17 0023  BNP 1,407.1*     DDimer No results for input(s): DDIMER in the last 168 hours.   Radiology    Dg Abd 1 View  Result Date: 11/08/2017 CLINICAL DATA:  Check for retained barium EXAM: ABDOMEN - 1 VIEW COMPARISON:  11/07/2017 FINDINGS: Contrast material is again identified throughout the colon. No obstructive changes are seen. Degenerative changes of lumbar spine are again noted. No new focal abnormality is seen. IMPRESSION: Considerable retained contrast within the colon likely precluding adequate upper GI examination. Electronically Signed   By: Inez Catalina M.D.   On: 11/08/2017 17:10   Dg Chest Port 1 View  Result Date: 11/09/2017 CLINICAL DATA:  Shortness of breath. History of hypertension and diabetes. Nonsmoker. EXAM: PORTABLE CHEST 1 VIEW COMPARISON:  11/04/2017  FINDINGS: Postoperative changes in the mediastinum. Cardiac enlargement. Perihilar interstitial infiltrates suggesting edema. No focal consolidation. No blunting of costophrenic angles. No pneumothorax. Calcified and tortuous aorta. IMPRESSION: Cardiac enlargement with perihilar edema. Electronically Signed   By: Lucienne Capers M.D.   On: 11/09/2017 01:13   Nm Gastric Emptying Solid Liquid Both W/sb Transit  Result Date: 11/08/2017 CLINICAL DATA:  Nausea and vomiting, history stroke, type II diabetes mellitus, coronary disease post MI, hypertension EXAM: NUCLEAR MEDICINE GASTRIC EMPTYING SCAN TECHNIQUE: After oral ingestion of radiolabeled meal, sequential abdominal images were obtained for 4 hours. Percentage of activity emptying the stomach was calculated at 1 hour, 2 hour, 3 hour, and 4 hours. RADIOPHARMACEUTICALS:  2.1 mCi Tc-82m sulfur colloid in standardized meal COMPARISON:  None FINDINGS: Expected location of the stomach in the left upper quadrant. Tracer empties the stomach gradually over the course of the study. 34% emptied at 1 hr ( normal >= 10%) 62% emptied at 2 hr ( normal >= 40%) 75% emptied at 3 hr ( normal >= 70%) 81% emptied at 4 hr ( normal >= 90%) IMPRESSION: Minimally decreased  gastric emptying. Electronically Signed   By: Lavonia Dana M.D.   On: 11/08/2017 13:14    Cardiac Studies   2D Echo 11/09/17 Study Conclusions  - Left ventricle: The cavity size was normal. There was mild focal   basal hypertrophy of the septum. Systolic function was mildly   reduced. The estimated ejection fraction was in the range of 45%   to 50%. Difficult to visualize. Consider Definity contrast if   clinically necessary. Diffuse hypokinesis. Doppler parameters are   consistent with abnormal left ventricular relaxation (grade 1   diastolic dysfunction). - Mitral valve: There was mild regurgitation directed posteriorly. - Pulmonary arteries: Systolic pressure was mildly increased. PA   peak  pressure: 32 mm Hg (S).  Patient Profile     [name] is a 84 y.o. female with hx of MI/CABG 2011, CKD III (followed byDr Florene Glen), HTN, hyperlipidemia, DM,CVA on 09/19/2016 w/ subacute infarction involving the right posterior limb of internal capsule and lateral thalamus who is being seen today for the evaluation of chest pain and SOB at the request of Dr. Reesa Chew.   Assessment & Plan   1. Chest Pain/Elevated Troponin (0.50>>0.71>>0.27): suspect Type II NSTEMI 2/2 demand ischemia.  Pt had CP yesterday, similar to prior angina  before undergoing CABG. This CP was in the setting of anemia and tachycardia, which resolves after IV metoprolol. This may represent ischemia, however pt does not want any invasive RX. Plan is to treat medically. Continue Plavix, metoprolol and amlodipine. Also try to keep Hgb > 8.0. She is currently CP free. HR in the 90s on tele.   2. CAD: h/o CABG and recent CP c/w angina, but in the setting of anemia and tachycardia. CP resolved with improvement in rate. 2D does show reduced LVF, compared to prior study. EF now 45-50% (previoulsy 55-60% in 08/2016). Diffuse hypokinesis noted. Pt does not want any invasive procedures. We will continue medical management. Continue Plavix, statin, BB, CC and LA nitrate. Keep Hgb > 8.0.    For questions or updates, please contact [place] Please consult [email or web address] for contact info under        Signed, Lyda Jester, PA-C  11/10/2017, 10:07 AM    Pt seen and examined   I agree with findings of B Simmons above Breathing comfortably  No CP  No nausea  On examJ JVP is mildly increased Lungs are CTA  Cardiac exam  RRR   No S3 Ext without edema  Will cycle one more troponin GIve lasix x1    Follow response and Cr. Pt is anemic   Hgb 7.8 this am  Would check after diuresis   Was 11.5 9 months ago   On plavix   Will keep for now   Guaiac stool  Dorris Carnes

## 2017-11-10 NOTE — Progress Notes (Signed)
PROGRESS NOTE    [name]  XTK:[id number] DOB: [birth year] DOA: 11/04/2017 PCP: Deland Pretty, MD   Brief Narrative: Patient is a 84 year old female with past medical history of diabetes type 2, CVA with left-sided residual weakness, coronary disease, hypertension, hyperlipidemia, CKD stage III, combined systolic and diastolic CHF who presented to the emergency department complaints of nausea and vomiting for 1 week.  Also reported history of weight loss and poor oral intake and dysphagia.  CT imaging and barium esophagram done which showed large hiatal hernia.  GI consulted  and she is undergoing upper GI series.  Assessment & Plan:   Active Problems:   Hyperlipidemia   Essential hypertension   CORONARY ATHEROSCLEROSIS NATIVE CORONARY ARTERY   Diabetes mellitus with complication (HCC)   Hemiparesis affecting left side as late effect of cerebrovascular accident (CVA) ([place])   Stage 3 chronic kidney disease (HCC)   Malnutrition of moderate degree   Dehydration   Abdominal pain   Pressure injury of skin   Bilious vomiting with nausea   Enlarged lymph node   Dysphagia   Dysphagia: Unknown etiology.  CT imaging and barium esophagogram showed  large hiatal hernia.  On PPI.  GI following.  Plan is to do upper GI series today.  Currently denies any abdominal pain, nausea or vomiting.  Malnutrition/anemia: Reported of  poor oral intake.  Patient was also started on IV iron for anemia.  Will change to oral on discharge.  History of coronary disease/elevated troponin/sinus tachycardia.: Denies any chest pain at present.  Troponin was found to be elevated but this is most likely secondary to supply demand ischemia.  Cardiology was following.  Patient denies any invasive procedure for further investigation.  Plan is to continue medical treatment.  Continue Plavix, statin, metoprolol, isosorbide mononitrate. Echocardiogram showed diffuse hypokinesis, ejection fraction of 45 to 50%, grade 1  diastolic dysfunction.  Abnormal CT imaging: CT imaging showed groundglass opacity in the right lung.  Concern for aspiration.  Pulmonology already evaluated her and recommended follow-up as an outpatient.  Essential hypertension: Continue current medications.  Blood pressure currently stable.  CKD stage III: Currently on baseline  Chronic congestive diastolic and systolic heart failure: Continue current regimen.  Currently euvolemic.  Stroke/left hemiparesis/stage II sacral pressure ulcer: Continue supportive care.  Patient ambulates with the help of wheelchair / walker   DVT prophylaxis: Lovenox Code Status: Full Family Communication: None present at the bedside Disposition Plan: Patient denied skilled nursing facility.  Likely home with home health after upper GI series and  GI clearance   Consultants: GI, cardiology, pulmonology   Procedures: None  Antimicrobials: None  Subjective: Patient seen and examined the bedside this morning.  He is comfortable.  Denies any abdominal pain, nausea or vomiting.  Awaiting upper GI study today.  Objective: Vitals:   11/09/17 1626 11/09/17 1951 11/10/17 0421 11/10/17 0445  BP: 130/67 138/61 133/67   Pulse: 93 90 92   Resp: 18 18 20    Temp: 98.3 F (36.8 C) 99.4 F (37.4 C) 98 F (36.7 C)   TempSrc:  Oral    SpO2: 97% 97% 99%   Weight:    56.7 kg  Height:        Intake/Output Summary (Last 24 hours) at 11/10/2017 1148 Last data filed at 11/09/2017 1458 Gross per 24 hour  Intake 40 ml  Output 1201 ml  Net -1161 ml   Filed Weights   11/08/17 1244 11/09/17 0345 11/10/17 0445  Weight: 60.1 kg 58.6  kg 56.7 kg    Examination:  General exam: Appears calm and comfortable ,Not in distress,average built HEENT:PERRL,Oral mucosa moist, Ear/Nose normal on gross exam Respiratory system: Bilateral equal air entry, normal vesicular breath sounds, no wheezes or crackles  Cardiovascular system: S1 & S2 heard, RRR. No JVD, murmurs,  rubs, gallops or clicks. No pedal edema. Gastrointestinal system: Abdomen is nondistended, soft and nontender. No organomegaly or masses felt. Normal bowel sounds heard. Central nervous system: Alert and oriented. Left hemiparesis Extremities: No edema, no clubbing ,no cyanosis, distal peripheral pulses palpable. Skin: No rashes, lesions or ulcers,no icterus ,no pallor MSK: Normal muscle bulk,tone ,power Psychiatry: Judgement and insight appear normal. Mood & affect appropriate.     Data Reviewed: I have personally reviewed following labs and imaging studies  CBC: Recent Labs  Lab 11/04/17 1110 11/05/17 0514 11/08/17 0615 11/09/17 0600 11/10/17 0605  WBC 10.0 7.9 8.0 13.2* 8.5  HGB 9.6* 9.1* 7.6* 8.2* 7.8*  HCT 30.2* 27.9* 24.5* 27.0* 25.3*  MCV 83.9 84.3 86.9 86.8 87.2  PLT 484* 456* 342 372 825   Basic Metabolic Panel: Recent Labs  Lab 11/04/17 1110 11/05/17 0514 11/08/17 0528 11/09/17 0600 11/10/17 0605  NA 133* 135 135 134* 138  K 5.1 4.1 3.9 4.1 3.8  CL 93* 99 106 103 102  CO2 27 24 21* 22 25  GLUCOSE 356* 192* 109* 148* 112*  BUN 14 13 7* 9 10  CREATININE 1.50* 1.49* 1.26* 1.22* 1.40*  CALCIUM 10.3 9.8 9.1 9.2 9.8  MG  --  1.8  --  1.5* 1.9  PHOS  --  2.5  --   --   --    GFR: Estimated Creatinine Clearance: 23 mL/min (A) (by C-G formula based on SCr of 1.4 mg/dL (H)). Liver Function Tests: Recent Labs  Lab 11/04/17 1110 11/05/17 0514  AST 17 14*  ALT 10 9  ALKPHOS 70 59  BILITOT 0.9 1.1  PROT 8.0 6.8  ALBUMIN 2.9* 2.6*   Recent Labs  Lab 11/04/17 1110  LIPASE 33   No results for input(s): AMMONIA in the last 168 hours. Coagulation Profile: Recent Labs  Lab 11/08/17 1643  INR 1.02   Cardiac Enzymes: Recent Labs  Lab 11/09/17 0023 11/09/17 0600 11/09/17 1131  TROPONINI 0.27* 0.50* 0.71*   BNP (last 3 results) No results for input(s): PROBNP in the last 8760 hours. HbA1C: No results for input(s): HGBA1C in the last 72  hours. CBG: Recent Labs  Lab 11/09/17 2012 11/09/17 2354 11/10/17 0420 11/10/17 0742 11/10/17 1128  GLUCAP 127* 102* 100* 111* 120*   Lipid Profile: No results for input(s): CHOL, HDL, LDLCALC, TRIG, CHOLHDL, LDLDIRECT in the last 72 hours. Thyroid Function Tests: No results for input(s): TSH, T4TOTAL, FREET4, T3FREE, THYROIDAB in the last 72 hours. Anemia Panel: No results for input(s): VITAMINB12, FOLATE, FERRITIN, TIBC, IRON, RETICCTPCT in the last 72 hours. Sepsis Labs: No results for input(s): PROCALCITON, LATICACIDVEN in the last 168 hours.  No results found for this or any previous visit (from the past 240 hour(s)).       Radiology Studies: Dg Abd 1 View  Result Date: 11/08/2017 CLINICAL DATA:  Check for retained barium EXAM: ABDOMEN - 1 VIEW COMPARISON:  11/07/2017 FINDINGS: Contrast material is again identified throughout the colon. No obstructive changes are seen. Degenerative changes of lumbar spine are again noted. No new focal abnormality is seen. IMPRESSION: Considerable retained contrast within the colon likely precluding adequate upper GI examination. Electronically Signed  By: Inez Catalina M.D.   On: 11/08/2017 17:10   Dg Chest Port 1 View  Result Date: 11/09/2017 CLINICAL DATA:  Shortness of breath. History of hypertension and diabetes. Nonsmoker. EXAM: PORTABLE CHEST 1 VIEW COMPARISON:  11/04/2017 FINDINGS: Postoperative changes in the mediastinum. Cardiac enlargement. Perihilar interstitial infiltrates suggesting edema. No focal consolidation. No blunting of costophrenic angles. No pneumothorax. Calcified and tortuous aorta. IMPRESSION: Cardiac enlargement with perihilar edema. Electronically Signed   By: Lucienne Capers M.D.   On: 11/09/2017 01:13   Nm Gastric Emptying Solid Liquid Both W/sb Transit  Result Date: 11/08/2017 CLINICAL DATA:  Nausea and vomiting, history stroke, type II diabetes mellitus, coronary disease post MI, hypertension EXAM: NUCLEAR  MEDICINE GASTRIC EMPTYING SCAN TECHNIQUE: After oral ingestion of radiolabeled meal, sequential abdominal images were obtained for 4 hours. Percentage of activity emptying the stomach was calculated at 1 hour, 2 hour, 3 hour, and 4 hours. RADIOPHARMACEUTICALS:  2.1 mCi Tc-30m sulfur colloid in standardized meal COMPARISON:  None FINDINGS: Expected location of the stomach in the left upper quadrant. Tracer empties the stomach gradually over the course of the study. 34% emptied at 1 hr ( normal >= 10%) 62% emptied at 2 hr ( normal >= 40%) 75% emptied at 3 hr ( normal >= 70%) 81% emptied at 4 hr ( normal >= 90%) IMPRESSION: Minimally decreased  gastric emptying. Electronically Signed   By: Lavonia Dana M.D.   On: 11/08/2017 13:14        Scheduled Meds: . amLODipine  10 mg Oral Daily  . atorvastatin  40 mg Oral Daily  . cloNIDine  0.1 mg Oral BID  . clopidogrel  75 mg Oral Daily  . enoxaparin (LOVENOX) injection  30 mg Subcutaneous Q24H  . feeding supplement (ENSURE ENLIVE)  237 mL Oral BID BM  . insulin aspart  0-9 Units Subcutaneous Q4H  . isosorbide mononitrate  30 mg Oral Daily  . metoprolol tartrate  25 mg Oral BID  . pantoprazole  40 mg Oral BID  . polyethylene glycol  17 g Oral Daily  . senna-docusate  1 tablet Oral BID   Continuous Infusions: . ferric gluconate (FERRLECIT/NULECIT) IV Stopped (11/08/17 1412)     LOS: 3 days    Time spent: 25 mins.More than 50% of that time was spent in counseling and/or coordination of care.      Shelly Coss, MD [facility] Pager [phone]  If 7PM-7AM, please contact night-coverage [email or web address] Password [email or web address] 11/10/2017, 11:48 AM

## 2017-11-10 NOTE — Care Management Important Message (Signed)
Important Message  Patient Details  Name: [name] MRN: [id number] Date of Birth: 02-09-1927   Medicare Important Message Given:  Yes    Kerin Salen 11/10/2017, 11:43 AMImportant Message  Patient Details  Name: [name] MRN: [id number] Date of Birth: October 14, 1927   Medicare Important Message Given:  Yes    Kerin Salen 11/10/2017, 11:43 AM

## 2017-11-10 NOTE — Progress Notes (Addendum)
     [facility] Progress Note  Received call from radiology upon completion of UGI.  Per radiology the study was limited by patient's ability to move around for study as well as retained contrast in colon from previous study.  Results as follows:  IMPRESSION: 1. 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 Differential includes benign and malignant stricturing and spasm at the GE junction. Recommend upper endoscopy for further evaluation. 2. Significant retention oral contrast within the esophagus during the entirety of exam 3. No gross evidence of mass within the stomach on limited exam.  Esophagram previously showed/reported no stricture.  I put her on full liquids for now.  May end up needing EGD but due to recent cardiac issues will need to be discussed with patient and her family.  Also they had previously wanted to avoid invasive evaluation.  Dr. Tarri Glenn will return to discuss with them on 10/11.

## 2017-11-10 NOTE — Telephone Encounter (Signed)
Attempted to call patient, a man answered and stated that the patient is currently at [facility] and may be there a few days. Left a message with him for when she is home to call and schedule follow up appointment.

## 2017-11-10 NOTE — Progress Notes (Signed)
Occupational Therapy Treatment Patient Details Name: [name] MRN: [id number] DOB: 1927-10-15 Today's Date: 11/10/2017    History of present illness [name] is a 84 y.o. female with medical history significant of DM 2, CVA left-sided weakness, history of CAD,  hypertension, hyperlipidemia, CKD 3, combined systolic diastolic CHF; admitted with dehydration, wt loss, dysphagia   OT comments  Pt went  In bed.  Aed pt OOB to chair, helped her wash up and back to bed.    Follow Up Recommendations  SNF    Equipment Recommendations  None recommended by OT    Recommendations for Other Services      Precautions / Restrictions Precautions Precautions: Fall Restrictions Weight Bearing Restrictions: No       Mobility Bed Mobility Overal bed mobility: Needs Assistance Bed Mobility: Supine to Sit     Supine to sit: Max assist Sit to supine: Max assist      Transfers Overall transfer level: Needs assistance Equipment used: None Transfers: Sit to/from [employer] Transfers Sit to Stand: +2 physical assistance;+2 safety/equipment;Mod assist;Max assist Stand pivot transfers: +2 physical assistance;+2 safety/equipment;Max assist;Mod assist            Balance Overall balance assessment: Needs assistance Sitting-balance support: Bilateral upper extremity supported;Feet supported Sitting balance-Leahy Scale: Poor Sitting balance - Comments: pt briefly sits EOB in midline then leans on R side; appears more related to lack of effort vs actual LOB       Standing balance comment: unable to test, pt refused                           ADL either performed or assessed with clinical judgement   ADL Overall ADL's : Needs assistance/impaired Eating/Feeding: Set up;Sitting                   Lower Body Dressing: +2 for safety/equipment;+2 for physical assistance;Total assistance;Sit to/from stand   Toilet Transfer: +2 for physical assistance;Maximal  assistance;Stand-pivot;BSC   Toileting- Clothing Manipulation and Hygiene: +2 for safety/equipment;Total assistance;Maximal assistance;Sit to/from stand;Cueing for safety         General ADL Comments: pt knees buckled with standing.  Needed 2 person A.  Pt got to chair with OT and CNA but had to get pt back to bed as pt going for procedure.     Vision Patient Visual Report: No change from baseline     Perception     Praxis      Cognition Arousal/Alertness: Awake/alert Behavior During Therapy: WFL for tasks assessed/performed Overall Cognitive Status: Within Functional Limits for tasks assessed                                 General Comments: pleasant lady                   Pertinent Vitals/ Pain       Pain Assessment: No/denies pain         Frequency  Min 2X/week        Progress Toward Goals  OT Goals(current goals can now be found in the care plan section)  Progress towards OT goals: Progressing toward goals     Plan Discharge plan remains appropriate       AM-PAC PT "6 Clicks" Daily Activity     Outcome Measure   Help from another person eating meals?: A Little Help from another  person taking care of personal grooming?: A Little Help from another person toileting, which includes using toliet, bedpan, or urinal?: A Lot Help from another person bathing (including washing, rinsing, drying)?: Total Help from another person to put on and taking off regular upper body clothing?: A Little Help from another person to put on and taking off regular lower body clothing?: Total 6 Click Score: 13    End of Session    OT Visit Diagnosis: Unsteadiness on feet (R26.81);Muscle weakness (generalized) (M62.81)   Activity Tolerance Patient tolerated treatment well   Patient Left in chair;with call bell/phone within reach;with chair alarm set   Nurse Communication Mobility status        Time: 5366-4403 OT Time Calculation (min): 38  min  Charges: OT General Charges $OT Visit: 1 Visit OT Treatments $Self Care/Home Management : 38-52 mins  Kari Baars, [place] [phone] Office- [phone]      Angelia Hazell, [name] 11/10/2017, 12:20 PM

## 2017-11-11 DIAGNOSIS — R748 Abnormal levels of other serum enzymes: Secondary | ICD-10-CM

## 2017-11-11 DIAGNOSIS — R112 Nausea with vomiting, unspecified: Secondary | ICD-10-CM

## 2017-11-11 LAB — GLUCOSE, CAPILLARY
GLUCOSE-CAPILLARY: 191 mg/dL — AB (ref 70–99)
GLUCOSE-CAPILLARY: 214 mg/dL — AB (ref 70–99)
Glucose-Capillary: 121 mg/dL — ABNORMAL HIGH (ref 70–99)
Glucose-Capillary: 132 mg/dL — ABNORMAL HIGH (ref 70–99)
Glucose-Capillary: 154 mg/dL — ABNORMAL HIGH (ref 70–99)
Glucose-Capillary: 179 mg/dL — ABNORMAL HIGH (ref 70–99)

## 2017-11-11 NOTE — Progress Notes (Addendum)
[facility] Progress Note  CC:  I can't swallow  Subjective: Overall feeling poorly. Ate some soup yesterday, but, this was not much. Wants to feel better. No other specific GI complaints today.  No family was present at the bedside.    Objective:  Vital signs in last 24 hours: Temp:  [97.6 F (36.4 C)-99.5 F (37.5 C)] 98.3 F (36.8 C) (10/12 0458) Pulse Rate:  [89-106] 90 (10/12 0458) Resp:  [16-22] 20 (10/12 0458) BP: (134-141)/(57-72) 140/57 (10/12 0458) SpO2:  [95 %-100 %] 95 % (10/12 0458) Last BM Date: 11/09/17 General:   Alert, in NAD, appears her stated age Abdomen:  Thin, soft, nontender and nondistended. Normal bowel sounds. No rebound or guarding Neurologic:  Alert and  oriented x4;  grossly normal neurologically. Psych:  Alert and cooperative. Normal mood and affect.  Intake/Output from previous day: 10/11 0701 - 10/12 0700 In: 120 [P.O.:120] Out: -  Intake/Output this shift: No intake/output data recorded.  Lab Results: Recent Labs    11/09/17 0600 11/10/17 0605 11/10/17 1517  WBC 13.2* 8.5 8.2  HGB 8.2* 7.8* 8.2*  HCT 27.0* 25.3* 26.6*  PLT 372 387 410*   BMET Recent Labs    11/09/17 0600 11/10/17 0605  NA 134* 138  K 4.1 3.8  CL 103 102  CO2 22 25  GLUCOSE 148* 112*  BUN 9 10  CREATININE 1.22* 1.40*  CALCIUM 9.2 9.8   LFT No results for input(s): PROT, ALBUMIN, AST, ALT, ALKPHOS, BILITOT, BILIDIR, IBILI in the last 72 hours. PT/INR Recent Labs    11/08/17 1643  LABPROT 13.3  INR 1.02    Dg Ugi W/small Bowel High Density  Result Date: 11/10/2017 CLINICAL DATA:  Dysphagia.  Patient reports food coming back up. EXAM: UPPER GI SERIES WITH KUB TECHNIQUE: After obtaining a scout radiograph a routine upper GI series was performed using thin barium FLUOROSCOPY TIME:  Fluoroscopy Time:  2.1 minutes Radiation Exposure Index (if provided by the fluoroscopic device): 60 mGy Number of Acquired Spot Images: 15 COMPARISON:  CT  10 19, gastric emptying study 11/08/2017, esophagram 11/06/2017 FINDINGS: Limited study due to patient immobility. Patient could not stand and has limited mobility. Single contrast exam performed through this region. Patient drank oral contrast with straw. Contrast filled a distended esophagus. Contrast was obstructed at the distal esophagus for long period time and was retained in the esophagus. Fluid esophagus retained to the level of the cervical esophagus. There is abrupt narrowing of the distal esophagus at the GE junction. Ultimately this stricturing at the [place] relaxes and contrast passed into the stomach. There is a smoothly marginated indentation at the GE junction which persists on all images. Limited view of the stomach demonstrates no evidence of obstruction or mass or torsion. Limited examination the stomach as above. At the end of the study with still significant retention of oral contrast within the esophagus. IMPRESSION: 1. 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 Differential includes benign and malignant stricturing and spasm at the GE junction. Recommend upper endoscopy for further evaluation. 2. Significant retention oral contrast within the esophagus during the entirety of exam 3. No gross evidence of mass within the stomach on limited exam. Findings conveyed toJESSICA ZEHR on 11/10/2017  at13:00. Electronically Signed   By: Suzy Bouchard M.D.   On: 11/10/2017 13:03    Assessment / Plan: Unexplained dysphagia, one month of early satiety and daily nausea, normocytic anemia without  overt GI blood loss and an unintentional weight loss not obviously explained by non-contrast CT abd/pelvis. Initial barium esophagram showed a large hiatal hernia and poor esophageal motility. UGI series suggested the possibility of distal esophageal spasm versus stricture.  Ca-19-9 is minimally elevated and Ca-125 is elevated. GES shows minimally delayed  gastric emptying at 4 hours. CT scan showed a lymph node in the fat containing umbilical hernia concerning for a sister Wynona Dove Node and abnormal right middle lung lobe. Family declined IR-directed biopsy of umbilical lymph node. Pulmonary recommended repeat chest CT in 3 months.   Hospitalization has been complicated by chest pain/shortness of breath with suspected Type II NSTEMI due to demand ischemia from anemia 11/09/17. BNP is also elevated. Cardiology thought it her symptoms could represent ischemia given her history, but the patient and family did not want invasive treatment. She is currently receiving medical management.  A unifying diagnosis remains unclear.   We are considering an endoscopy to exclude an esophageal stricture, although her dysphagia may represent severe dysmotility given careful review of the esophagram and UGI. Would want to consider the risks of endoscopy and anesthesia carefully at this time. I spoke with the daughter, Lilianne Delair, by phone, [phone]. She would like to discuss with her brother. Endoscopy is not urgent at this time and we have the opportunity to make informed decisions.   - Continue BID PPI  - Diet as tolerated - Consider contrasted cross-sectional imaging of the chest/abd/pelvis if there is a way to protect/preserve her renal function - Possible EGD next week for further evaluation of her dysphagia if cardiopulmonary status is stable and the patient and family agree - Consider further evaluation for the elevated Ca-125 if contrasted cross-sectional imaging cannot be pursued  I discussed assessment and recommendations with Dr. Tawanna Solo. Will check back Monday, 11/13/17. Please call the on-call Gastroenterologist with any questions or concerns in the meantime.   Thornton Park, MD, MPH [facility]

## 2017-11-11 NOTE — Progress Notes (Signed)
PROGRESS NOTE    [name]  GMW:[id number] DOB: 01-Jan-1928 DOA: 11/04/2017 PCP: Deland Pretty, MD   Brief Narrative: Patient is a 84 year old female with past medical history of diabetes type 2, CVA with left-sided residual weakness, coronary disease, hypertension, hyperlipidemia, CKD stage III, combined systolic and diastolic CHF who presented to the emergency department complaints of nausea and vomiting for 1 week.  Also reported history of weight loss and poor oral intake and dysphagia.  CT imaging and barium esophagram done which showed large hiatal hernia.  GI consulted  and she is underwent  upper GI series which showed obstruction of the esophagus at the GE junction.Possible plan for upper GI endoscopy.  Assessment & Plan:   Active Problems:   Hyperlipidemia   Essential hypertension   CORONARY ATHEROSCLEROSIS NATIVE CORONARY ARTERY   Diabetes mellitus with complication (HCC)   Hemiparesis affecting left side as late effect of cerebrovascular accident (CVA) ([place])   Stage 3 chronic kidney disease (HCC)   Malnutrition of moderate degree   Dehydration   Abdominal pain   Pressure injury of skin   Bilious vomiting with nausea   Enlarged lymph node   Dysphagia   Dysphagia: Upper GI series showed  obstruction of the esophagus at the GE junction by intermittent high-grade and partial stricturing. Differential includes benign and malignant stricturing and spasm at the GE junction. GI planning for upper GI endoscopy. CT imaging and barium esophagogram showed  large hiatal hernia.  On PPI.  GI following. Currently denies any abdominal pain, nausea or vomiting.Vomited yesterday though.  Currently on full liquid diet.  Malnutrition/anemia: Reported of  poor oral intake.  Patient was also started on IV iron for anemia.  Will change to oral on discharge. Follow up FOBT  History of coronary disease/elevated troponin/sinus tachycardia.: Denies any chest pain at present.  Troponin was found  to be elevated but this is most likely secondary to supply demand ischemia.  Cardiology was following.  Patient denies any invasive procedure for further investigation.  Plan is to continue medical treatment.  Continue Plavix, statin, metoprolol, isosorbide mononitrate. Echocardiogram showed diffuse hypokinesis, ejection fraction of 45 to 44%, grade 1 diastolic dysfunction.  Abnormal CT imaging: CT imaging showed groundglass opacity in the right lung.  Concern for aspiration.  Pulmonology already evaluated her and recommended follow-up as an outpatient CT repeat in 3 months.  Essential hypertension: Continue current medications.  Blood pressure currently stable.  CKD stage III: Currently on baseline  Chronic congestive diastolic and systolic heart failure: Continue current regimen.  Currently euvolemic.  Stroke/left hemiparesis/stage II sacral pressure ulcer: Continue supportive care.  Patient ambulates with the help of wheelchair / walker   DVT prophylaxis: Lovenox Code Status: Full Family Communication: None present at the bedside Disposition Plan: Patient denied skilled nursing facility.  Likely home with home health after  GI clearance   Consultants: GI, cardiology, pulmonology   Procedures: None  Antimicrobials: None  Subjective: Patient seen and examined the bedside this morning.  She was found to be comfortable.  Denies any abdominal pain, nausea or vomiting currently.  Reported vomiting yesterday evening.  Awaiting GI evaluation today.  Objective: Vitals:   11/10/17 0445 11/10/17 1347 11/10/17 1956 11/11/17 0458  BP:  134/60 (!) 141/72 (!) 140/57  Pulse:  89 (!) 106 90  Resp:  16 (!) 22 20  Temp:  97.6 F (36.4 C) 99.5 F (37.5 C) 98.3 F (36.8 C)  TempSrc:  Oral Oral Oral  SpO2:   100%  95%  Weight: 56.7 kg     Height:        Intake/Output Summary (Last 24 hours) at 11/11/2017 1140 Last data filed at 11/10/2017 1300 Gross per 24 hour  Intake 120 ml  Output -    Net 120 ml   Filed Weights   11/08/17 1244 11/09/17 0345 11/10/17 0445  Weight: 60.1 kg 58.6 kg 56.7 kg    Examination:  General exam: Appears calm and comfortable ,Not in distress,average built HEENT:PERRL,Oral mucosa moist, Ear/Nose normal on gross exam Respiratory system: Bilateral equal air entry, normal vesicular breath sounds, no wheezes or crackles  Cardiovascular system: S1 & S2 heard, RRR. No JVD, murmurs, rubs, gallops or clicks. No pedal edema. Gastrointestinal system: Abdomen is nondistended, soft and nontender. No organomegaly or masses felt. Normal bowel sounds heard. Central nervous system: Alert and oriented. Left hemiparesis Extremities: No edema, no clubbing ,no cyanosis, distal peripheral pulses palpable. Skin: No rashes, lesions or ulcers,no icterus ,no pallor MSK: Normal muscle bulk,tone ,power Psychiatry: Judgement and insight appear normal. Mood & affect appropriate.     Data Reviewed: I have personally reviewed following labs and imaging studies  CBC: Recent Labs  Lab 11/05/17 0514 11/08/17 0615 11/09/17 0600 11/10/17 0605 11/10/17 1517  WBC 7.9 8.0 13.2* 8.5 8.2  HGB 9.1* 7.6* 8.2* 7.8* 8.2*  HCT 27.9* 24.5* 27.0* 25.3* 26.6*  MCV 84.3 86.9 86.8 87.2 86.6  PLT 456* 342 372 387 161*   Basic Metabolic Panel: Recent Labs  Lab 11/05/17 0514 11/08/17 0528 11/09/17 0600 11/10/17 0605  NA 135 135 134* 138  K 4.1 3.9 4.1 3.8  CL 99 106 103 102  CO2 24 21* 22 25  GLUCOSE 192* 109* 148* 112*  BUN 13 7* 9 10  CREATININE 1.49* 1.26* 1.22* 1.40*  CALCIUM 9.8 9.1 9.2 9.8  MG 1.8  --  1.5* 1.9  PHOS 2.5  --   --   --    GFR: Estimated Creatinine Clearance: 23 mL/min (A) (by C-G formula based on SCr of 1.4 mg/dL (H)). Liver Function Tests: Recent Labs  Lab 11/05/17 0514  AST 14*  ALT 9  ALKPHOS 59  BILITOT 1.1  PROT 6.8  ALBUMIN 2.6*   No results for input(s): LIPASE, AMYLASE in the last 168 hours. No results for input(s): AMMONIA in  the last 168 hours. Coagulation Profile: Recent Labs  Lab 11/08/17 1643  INR 1.02   Cardiac Enzymes: Recent Labs  Lab 11/09/17 0023 11/09/17 0600 11/09/17 1131 11/10/17 1517  TROPONINI 0.27* 0.50* 0.71* 0.34*   BNP (last 3 results) No results for input(s): PROBNP in the last 8760 hours. HbA1C: No results for input(s): HGBA1C in the last 72 hours. CBG: Recent Labs  Lab 11/10/17 1602 11/10/17 2047 11/11/17 0039 11/11/17 0459 11/11/17 0749  GLUCAP 196* 201* 179* 132* 121*   Lipid Profile: No results for input(s): CHOL, HDL, LDLCALC, TRIG, CHOLHDL, LDLDIRECT in the last 72 hours. Thyroid Function Tests: No results for input(s): TSH, T4TOTAL, FREET4, T3FREE, THYROIDAB in the last 72 hours. Anemia Panel: No results for input(s): VITAMINB12, FOLATE, FERRITIN, TIBC, IRON, RETICCTPCT in the last 72 hours. Sepsis Labs: No results for input(s): PROCALCITON, LATICACIDVEN in the last 168 hours.  No results found for this or any previous visit (from the past 240 hour(s)).       Radiology Studies: Dg Ugi W/small Bowel High Density  Result Date: 11/10/2017 CLINICAL DATA:  Dysphagia.  Patient reports food coming back up. EXAM: UPPER GI SERIES  WITH KUB TECHNIQUE: After obtaining a scout radiograph a routine upper GI series was performed using thin barium FLUOROSCOPY TIME:  Fluoroscopy Time:  2.1 minutes Radiation Exposure Index (if provided by the fluoroscopic device): 60 mGy Number of Acquired Spot Images: 15 COMPARISON:  CT 10 19, gastric emptying study 11/08/2017, esophagram 11/06/2017 FINDINGS: Limited study due to patient immobility. Patient could not stand and has limited mobility. Single contrast exam performed through this region. Patient drank oral contrast with straw. Contrast filled a distended esophagus. Contrast was obstructed at the distal esophagus for long period time and was retained in the esophagus. Fluid esophagus retained to the level of the cervical esophagus.  There is abrupt narrowing of the distal esophagus at the GE junction. Ultimately this stricturing at the [place] relaxes and contrast passed into the stomach. There is a smoothly marginated indentation at the GE junction which persists on all images. Limited view of the stomach demonstrates no evidence of obstruction or mass or torsion. Limited examination the stomach as above. At the end of the study with still significant retention of oral contrast within the esophagus. IMPRESSION: 1. 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 Differential includes benign and malignant stricturing and spasm at the GE junction. Recommend upper endoscopy for further evaluation. 2. Significant retention oral contrast within the esophagus during the entirety of exam 3. No gross evidence of mass within the stomach on limited exam. Findings conveyed toJESSICA ZEHR on 11/10/2017  at13:00. Electronically Signed   By: Suzy Bouchard M.D.   On: 11/10/2017 13:03        Scheduled Meds: . amLODipine  10 mg Oral Daily  . atorvastatin  40 mg Oral Daily  . cloNIDine  0.1 mg Oral BID  . clopidogrel  75 mg Oral Daily  . enoxaparin (LOVENOX) injection  30 mg Subcutaneous Q24H  . feeding supplement (ENSURE ENLIVE)  237 mL Oral BID BM  . insulin aspart  0-9 Units Subcutaneous Q4H  . isosorbide mononitrate  30 mg Oral Daily  . metoprolol tartrate  25 mg Oral BID  . pantoprazole  40 mg Oral BID  . polyethylene glycol  17 g Oral Daily  . senna-docusate  1 tablet Oral BID   Continuous Infusions: . ferric gluconate (FERRLECIT/NULECIT) IV Stopped (11/08/17 1412)     LOS: 4 days    Time spent: 25 mins.More than 50% of that time was spent in counseling and/or coordination of care.      Shelly Coss, MD [facility] Pager [phone]  If 7PM-7AM, please contact night-coverage [email or web address] Password [email or web address] 11/11/2017, 11:40 AM

## 2017-11-11 NOTE — Progress Notes (Signed)
Progress Note  Patient Name: [name] Date of Encounter: 11/11/2017  Primary Cardiologist: Minus Breeding, MD   Patient Profile     [name] is a 84 y.o. female with hx of MI/CABG 2011, CKD III (followed byDr Florene Glen), HTN, hyperlipidemia, DM,CVA on 09/19/2016 w/ subacute infarction involving the right posterior limb of internal capsule and lateral thalamus who seen  for the evaluation of chest pain, + Tn  and SOB   On admission sinus tachycardia and Tn attributed/2 demand New Anemia with borderline low Fe Sat GI eval  Esophogeal stricture for weight loss   EF 45-50 <<55-60 %  Subjective   Without chet pain or shortness of breath   It all started with being unable to eat, food getting stuck pain and vomiting   Inpatient Medications    Scheduled Meds: . amLODipine  10 mg Oral Daily  . atorvastatin  40 mg Oral Daily  . cloNIDine  0.1 mg Oral BID  . clopidogrel  75 mg Oral Daily  . enoxaparin (LOVENOX) injection  30 mg Subcutaneous Q24H  . feeding supplement (ENSURE ENLIVE)  237 mL Oral BID BM  . insulin aspart  0-9 Units Subcutaneous Q4H  . isosorbide mononitrate  30 mg Oral Daily  . metoprolol tartrate  25 mg Oral BID  . pantoprazole  40 mg Oral BID  . polyethylene glycol  17 g Oral Daily  . senna-docusate  1 tablet Oral BID   Continuous Infusions: . ferric gluconate (FERRLECIT/NULECIT) IV Stopped (11/08/17 1412)   PRN Meds: acetaminophen **OR** acetaminophen, alum & mag hydroxide-simeth, HYDROcodone-acetaminophen, levalbuterol, morphine injection, nitroGLYCERIN, ondansetron **OR** ondansetron (ZOFRAN) IV, ondansetron (ZOFRAN) IV   Vital Signs    Vitals:   11/10/17 0445 11/10/17 1347 11/10/17 1956 11/11/17 0458  BP:  134/60 (!) 141/72 (!) 140/57  Pulse:  89 (!) 106 90  Resp:  16 (!) 22 20  Temp:  97.6 F (36.4 C) 99.5 F (37.5 C) 98.3 F (36.8 C)  TempSrc:  Oral Oral Oral  SpO2:   100% 95%  Weight: 56.7 kg     Height:        Intake/Output  Summary (Last 24 hours) at 11/11/2017 1002 Last data filed at 11/10/2017 1300 Gross per 24 hour  Intake 120 ml  Output -  Net 120 ml   Filed Weights   11/08/17 1244 11/09/17 0345 11/10/17 0445  Weight: 60.1 kg 58.6 kg 56.7 kg    Telemetry    NSR 90s - Personally Reviewed  ECG    10/10 sinus tach 104- Personally Reviewed  Physical Exam  Well developed and nourished in no acute distress HENT normal Neck supple with JVP-flat Clear Regular rate and rhythm, no murmurs or gallops Abd-soft with active BS No Clubbing cyanosis edema Skin-warm and dry A & Oriented  Grossly normal sensory and motor function   Labs    Chemistry Recent Labs  Lab 11/04/17 1110 11/05/17 0514 11/08/17 0528 11/09/17 0600 11/10/17 0605  NA 133* 135 135 134* 138  K 5.1 4.1 3.9 4.1 3.8  CL 93* 99 106 103 102  CO2 27 24 21* 22 25  GLUCOSE 356* 192* 109* 148* 112*  BUN 14 13 7* 9 10  CREATININE 1.50* 1.49* 1.26* 1.22* 1.40*  CALCIUM 10.3 9.8 9.1 9.2 9.8  PROT 8.0 6.8  --   --   --   ALBUMIN 2.9* 2.6*  --   --   --   AST 17 14*  --   --   --  ALT 10 9  --   --   --   ALKPHOS 70 59  --   --   --   BILITOT 0.9 1.1  --   --   --   GFRNONAA 30* 30* 37* 38* 32*  GFRAA 35* 35* 43* 44* 38*  ANIONGAP 13 12 8 9 11      Hematology Recent Labs  Lab 11/09/17 0600 11/10/17 0605 11/10/17 1517  WBC 13.2* 8.5 8.2  RBC 3.11* 2.90* 3.07*  HGB 8.2* 7.8* 8.2*  HCT 27.0* 25.3* 26.6*  MCV 86.8 87.2 86.6  MCH 26.4 26.9 26.7  MCHC 30.4 30.8 30.8  RDW 15.1 15.3 15.2  PLT 372 387 410*    Cardiac Enzymes Recent Labs  Lab 11/09/17 0023 11/09/17 0600 11/09/17 1131 11/10/17 1517  TROPONINI 0.27* 0.50* 0.71* 0.34*   No results for input(s): TROPIPOC in the last 168 hours.   BNP Recent Labs  Lab 11/09/17 0023  BNP 1,407.1*     DDimer No results for input(s): DDIMER in the last 168 hours.   Radiology    Dg Ugi W/small Bowel High Density  Result Date: 11/10/2017 CLINICAL DATA:  Dysphagia.   Patient reports food coming back up. EXAM: UPPER GI SERIES WITH KUB TECHNIQUE: After obtaining a scout radiograph a routine upper GI series was performed using thin barium FLUOROSCOPY TIME:  Fluoroscopy Time:  2.1 minutes Radiation Exposure Index (if provided by the fluoroscopic device): 60 mGy Number of Acquired Spot Images: 15 COMPARISON:  CT 10 19, gastric emptying study 11/08/2017, esophagram 11/06/2017 FINDINGS: Limited study due to patient immobility. Patient could not stand and has limited mobility. Single contrast exam performed through this region. Patient drank oral contrast with straw. Contrast filled a distended esophagus. Contrast was obstructed at the distal esophagus for long period time and was retained in the esophagus. Fluid esophagus retained to the level of the cervical esophagus. There is abrupt narrowing of the distal esophagus at the GE junction. Ultimately this stricturing at the [place] relaxes and contrast passed into the stomach. There is a smoothly marginated indentation at the GE junction which persists on all images. Limited view of the stomach demonstrates no evidence of obstruction or mass or torsion. Limited examination the stomach as above. At the end of the study with still significant retention of oral contrast within the esophagus. IMPRESSION: 1. 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 Differential includes benign and malignant stricturing and spasm at the GE junction. Recommend upper endoscopy for further evaluation. 2. Significant retention oral contrast within the esophagus during the entirety of exam 3. No gross evidence of mass within the stomach on limited exam. Findings conveyed toJESSICA [name] on 11/10/2017  at13:00. Electronically Signed   By: Suzy Bouchard M.D.   On: 11/10/2017 13:03    Cardiac Studies   2D Echo 11/09/17 Study Conclusions  - Left ventricle: The cavity size was normal. There was mild  focal   basal hypertrophy of the septum. Systolic function was mildly   reduced. The estimated ejection fraction was in the range of 45%   to 50%. Difficult to visualize. Consider Definity contrast if   clinically necessary. Diffuse hypokinesis. Doppler parameters are   consistent with abnormal left ventricular relaxation (grade 1   diastolic dysfunction). - Mitral valve: There was mild regurgitation directed posteriorly. - Pulmonary arteries: Systolic pressure was mildly increased. PA   peak pressure: 32 mm Hg (S).    Assessment & Plan  Chest Pain/Elevated Troponin (0.50>>0.71>>0.27):    CAD: h/o CABG and recent CP c/w angina, but in the setting of anemia and tachycardia.    Esophogeal stricture     Her symptoms, with weight loss and GI trigger, certainly point away from a primary cardiac event>> GI    Studies reviewed and to my ignorant read, they are conflicting with Ba swallow saying no obstruction and UGI describing GE junction obstruction  Would encourage GI to proceed at this juncture Weight loss is impressive   For questions or updates, please contact [facility] Please consult [email or web address] for contact info under        Signed, Virl Axe, MD  11/11/2017, 10:02 AM       Virl Axe

## 2017-11-12 LAB — GLUCOSE, CAPILLARY
GLUCOSE-CAPILLARY: 201 mg/dL — AB (ref 70–99)
GLUCOSE-CAPILLARY: 145 mg/dL — AB (ref 70–99)
Glucose-Capillary: 157 mg/dL — ABNORMAL HIGH (ref 70–99)
Glucose-Capillary: 123 mg/dL — ABNORMAL HIGH (ref 70–99)
Glucose-Capillary: 179 mg/dL — ABNORMAL HIGH (ref 70–99)

## 2017-11-12 LAB — BASIC METABOLIC PANEL
Anion gap: 12 (ref 5–15)
BUN: 15 mg/dL (ref 8–23)
CO2: 27 mmol/L (ref 22–32)
CREATININE: 1.48 mg/dL — AB (ref 0.44–1.00)
Calcium: 9.3 mg/dL (ref 8.9–10.3)
Chloride: 95 mmol/L — ABNORMAL LOW (ref 98–111)
GFR calc Af Amer: 35 mL/min — ABNORMAL LOW (ref >60)
GFR calc non Af Amer: 30 mL/min — ABNORMAL LOW (ref >60)
Glucose, Bld: 154 mg/dL — ABNORMAL HIGH (ref 70–99)
POTASSIUM: 3.5 mmol/L (ref 3.5–5.1)
Sodium: 134 mmol/L — ABNORMAL LOW (ref 135–145)

## 2017-11-12 LAB — CBC WITH DIFFERENTIAL/PLATELET
ABS IMMATURE GRANULOCYTES: 0.05 10*3/uL (ref 0.00–0.07)
Basophils Absolute: 0 10*3/uL (ref 0.0–0.1)
Basophils Relative: 1 %
Eosinophils Absolute: 0.1 10*3/uL (ref 0.0–0.5)
Eosinophils Relative: 1 %
HEMATOCRIT: 25 % — AB (ref 36.0–46.0)
HEMOGLOBIN: 7.8 g/dL — AB (ref 12.0–15.0)
Immature Granulocytes: 1 %
LYMPHS ABS: 0.8 10*3/uL (ref 0.7–4.0)
Lymphocytes Relative: 10 %
MCH: 27.1 pg (ref 26.0–34.0)
MCHC: 31.2 g/dL (ref 30.0–36.0)
MCV: 86.8 fL (ref 80.0–100.0)
MONO ABS: 0.8 10*3/uL (ref 0.1–1.0)
MONOS PCT: 10 %
NEUTROS ABS: 6.1 10*3/uL (ref 1.7–7.7)
Neutrophils Relative %: 77 %
Platelets: 392 10*3/uL (ref 150–400)
RBC: 2.88 MIL/uL — ABNORMAL LOW (ref 3.87–5.11)
RDW: 15.2 % (ref 11.5–15.5)
WBC: 7.8 10*3/uL (ref 4.0–10.5)
nRBC: 0 % (ref 0.0–0.2)

## 2017-11-12 MED ORDER — AMLODIPINE BESYLATE 5 MG PO TABS
5.0000 mg | ORAL_TABLET | Freq: Every day | ORAL | Status: DC
  Administered 2017-11-12 – 2017-11-22 (×11): 5 mg via ORAL
  Filled 2017-11-12 (×11): qty 1

## 2017-11-12 MED ORDER — METOPROLOL TARTRATE 50 MG PO TABS
50.0000 mg | ORAL_TABLET | Freq: Two times a day (BID) | ORAL | Status: DC
  Administered 2017-11-12 – 2017-11-23 (×23): 50 mg via ORAL
  Filled 2017-11-12 (×23): qty 1

## 2017-11-12 NOTE — Progress Notes (Signed)
PROGRESS NOTE    [name]  AOZ:[id number] DOB: [birth year] DOA: 11/04/2017 PCP: Deland Pretty, MD   Brief Narrative: Patient is a 84 year old female with past medical history of diabetes type 2, CVA with left-sided residual weakness, coronary disease, hypertension, hyperlipidemia, CKD stage III, combined systolic and diastolic CHF who presented to the emergency department complaints of nausea and vomiting for 1 week.  Also reported history of weight loss and poor oral intake and dysphagia.  CT imaging and barium esophagram done which showed large hiatal hernia.  GI consulted  and she is underwent  upper GI series which showed obstruction of the esophagus at the GE junction.Possible plan for upper GI endoscopy.  Assessment & Plan:   Active Problems:   Hyperlipidemia   Essential hypertension   CORONARY ATHEROSCLEROSIS NATIVE CORONARY ARTERY   Diabetes mellitus with complication (HCC)   Hemiparesis affecting left side as late effect of cerebrovascular accident (CVA) ([place])   Stage 3 chronic kidney disease (HCC)   Malnutrition of moderate degree   Dehydration   Abdominal pain   Pressure injury of skin   Bilious vomiting with nausea   Enlarged lymph node   Dysphagia   Nausea and vomiting   Dysphagia: Upper GI series showed  obstruction of the esophagus at the GE junction by intermittent high-grade and partial stricturing. Differential includes benign andmalignant stricturing and spasm at the GE junction. GI planning for upper GI endoscopy after discussing with the daughter/son. I have spoken to the son on phone today and I feel he is agreeable to go for procedure.  I will leave this decision to GI. CT imaging and barium esophagogram showed  large hiatal hernia.  On PPI.  GI following. Currently denies any abdominal pain, nausea or vomiting.  Currently on full liquid diet.  Malnutrition/anemia: Reported of  poor oral intake.  Patient was also started on IV iron for anemia.  Will change to  oral on discharge. Follow up FOBT.  We will transfuse her if her hemoglobin drops below 7.  History of coronary disease/elevated troponin/sinus tachycardia.: Denies any chest pain at present.  Troponin was found to be elevated but this is most likely secondary to supply demand ischemia.  Cardiology was following.  Patient denies any invasive procedure for further investigation.  Plan is to continue medical treatment.  Continue Plavix, statin, metoprolol, isosorbide mononitrate. Echocardiogram showed diffuse hypokinesis, ejection fraction of 45 to 96%, grade 1 diastolic dysfunction.  Abnormal CT imaging: CT imaging showed groundglass opacity in the right lung.  Concern for aspiration.  Pulmonology already evaluated her and recommended follow-up as an outpatient CT repeat in 3 months.  Essential hypertension: Continue current medications.  Blood pressure currently stable.  CKD stage III: Currently on baseline  Chronic congestive diastolic and systolic heart failure: Continue current regimen.  Currently euvolemic.  Stroke/left hemiparesis/stage II sacral pressure ulcer: Continue supportive care.  Patient ambulates with the help of wheelchair / walker   DVT prophylaxis: Lovenox Code Status: Full Family Communication: Discussed with son on phone Disposition Plan: Patient denied skilled nursing facility.  Likely home with home health after  GI clearance   Consultants: GI, cardiology, pulmonology   Procedures: None  Antimicrobials: None  Subjective: Patient seen and examined the bedside this morning.  She was found to be comfortable.  Denies any abdominal pain, nausea or vomiting currently.  Denies any chest pain  Objective: Vitals:   11/11/17 0458 11/11/17 1438 11/11/17 2012 11/12/17 0448  BP: (!) 140/57 114/67 137/68 (!) 145/65  Pulse: 90 87 93 95  Resp: 20 18 17 17   Temp: 98.3 F (36.8 C) 98.7 F (37.1 C) 98.1 F (36.7 C) 97.9 F (36.6 C)  TempSrc: Oral  Oral Oral  SpO2: 95%  100% 100% 100%  Weight:   57.8 kg 57.6 kg  Height:        Intake/Output Summary (Last 24 hours) at 11/12/2017 1348 Last data filed at 11/12/2017 1211 Gross per 24 hour  Intake 900 ml  Output 500 ml  Net 400 ml   Filed Weights   11/10/17 0445 11/11/17 2012 11/12/17 0448  Weight: 56.7 kg 57.8 kg 57.6 kg    Examination:  General exam: Appears calm and comfortable ,Not in distress,average built HEENT:PERRL,Oral mucosa moist, Ear/Nose normal on gross exam Respiratory system: Bilateral equal air entry, normal vesicular breath sounds, no wheezes or crackles  Cardiovascular system: S1 & S2 heard, RRR. No JVD, murmurs, rubs, gallops or clicks. No pedal edema. Gastrointestinal system: Abdomen is nondistended, soft and nontender. No organomegaly or masses felt. Normal bowel sounds heard. Central nervous system: Alert and oriented. Left hemiparesis Extremities: No edema, no clubbing ,no cyanosis, distal peripheral pulses palpable. Skin: No rashes, lesions or ulcers,no icterus ,no pallor MSK: Normal muscle bulk,tone ,power Psychiatry: Judgement and insight appear normal. Mood & affect appropriate.     Data Reviewed: I have personally reviewed following labs and imaging studies  CBC: Recent Labs  Lab 11/08/17 0615 11/09/17 0600 11/10/17 0605 11/10/17 1517 11/12/17 0541  WBC 8.0 13.2* 8.5 8.2 7.8  NEUTROABS  --   --   --   --  6.1  HGB 7.6* 8.2* 7.8* 8.2* 7.8*  HCT 24.5* 27.0* 25.3* 26.6* 25.0*  MCV 86.9 86.8 87.2 86.6 86.8  PLT 342 372 387 410* 850   Basic Metabolic Panel: Recent Labs  Lab 11/08/17 0528 11/09/17 0600 11/10/17 0605 11/12/17 0541  NA 135 134* 138 134*  K 3.9 4.1 3.8 3.5  CL 106 103 102 95*  CO2 21* 22 25 27   GLUCOSE 109* 148* 112* 154*  BUN 7* 9 10 15   CREATININE 1.26* 1.22* 1.40* 1.48*  CALCIUM 9.1 9.2 9.8 9.3  MG  --  1.5* 1.9  --    GFR: Estimated Creatinine Clearance: 21.7 mL/min (A) (by C-G formula based on SCr of 1.48 mg/dL (H)). Liver  Function Tests: No results for input(s): AST, ALT, ALKPHOS, BILITOT, PROT, ALBUMIN in the last 168 hours. No results for input(s): LIPASE, AMYLASE in the last 168 hours. No results for input(s): AMMONIA in the last 168 hours. Coagulation Profile: Recent Labs  Lab 11/08/17 1643  INR 1.02   Cardiac Enzymes: Recent Labs  Lab 11/09/17 0023 11/09/17 0600 11/09/17 1131 11/10/17 1517  TROPONINI 0.27* 0.50* 0.71* 0.34*   BNP (last 3 results) No results for input(s): PROBNP in the last 8760 hours. HbA1C: No results for input(s): HGBA1C in the last 72 hours. CBG: Recent Labs  Lab 11/11/17 1606 11/11/17 2016 11/12/17 0453 11/12/17 0738 11/12/17 1133  GLUCAP 191* 214* 157* 123* 179*   Lipid Profile: No results for input(s): CHOL, HDL, LDLCALC, TRIG, CHOLHDL, LDLDIRECT in the last 72 hours. Thyroid Function Tests: No results for input(s): TSH, T4TOTAL, FREET4, T3FREE, THYROIDAB in the last 72 hours. Anemia Panel: No results for input(s): VITAMINB12, FOLATE, FERRITIN, TIBC, IRON, RETICCTPCT in the last 72 hours. Sepsis Labs: No results for input(s): PROCALCITON, LATICACIDVEN in the last 168 hours.  No results found for this or any previous visit (from the past  240 hour(s)).       Radiology Studies: No results found.      Scheduled Meds: . amLODipine  5 mg Oral Daily  . atorvastatin  40 mg Oral Daily  . cloNIDine  0.1 mg Oral BID  . clopidogrel  75 mg Oral Daily  . enoxaparin (LOVENOX) injection  30 mg Subcutaneous Q24H  . feeding supplement (ENSURE ENLIVE)  237 mL Oral BID BM  . insulin aspart  0-9 Units Subcutaneous Q4H  . isosorbide mononitrate  30 mg Oral Daily  . metoprolol tartrate  50 mg Oral BID  . pantoprazole  40 mg Oral BID  . polyethylene glycol  17 g Oral Daily  . senna-docusate  1 tablet Oral BID   Continuous Infusions: . ferric gluconate (FERRLECIT/NULECIT) IV Stopped (11/08/17 1412)     LOS: 5 days    Time spent: 25 mins.More than 50% of  that time was spent in counseling and/or coordination of care.      Shelly Coss, MD [facility] Pager [phone]  If 7PM-7AM, please contact night-coverage [email or web address] Password [email or web address] 11/12/2017, 1:48 PM

## 2017-11-12 NOTE — Progress Notes (Signed)
Progress Note  Patient Name: [name] Date of Encounter: 11/12/2017  Primary Cardiologist: Minus Breeding, MD   Patient Profile     [name] is a 84 y.o. female with hx of MI/CABG 2011, CKD III (followed byDr Florene Glen), HTN, hyperlipidemia, DM,CVA on 09/19/2016 w/ subacute infarction involving the right posterior limb of internal capsule and lateral thalamus who seen  for the evaluation of chest pain, + Tn  and SOB   On admission sinus tachycardia and Tn attributed/2 demand New Anemia with borderline low Fe Sat GI eval  Esophogeal stricture for weight loss   EF 45-50 <<55-60 %  Subjective  Without chest pain or shortness of breath today   I am not sure how much she understands of what is going on; she seemed to have more comprehension of GIs efforts to discuss things with her family  appreciate careful note from [facility]  Inpatient Medications    Scheduled Meds: . amLODipine  10 mg Oral Daily  . atorvastatin  40 mg Oral Daily  . cloNIDine  0.1 mg Oral BID  . clopidogrel  75 mg Oral Daily  . enoxaparin (LOVENOX) injection  30 mg Subcutaneous Q24H  . feeding supplement (ENSURE ENLIVE)  237 mL Oral BID BM  . insulin aspart  0-9 Units Subcutaneous Q4H  . isosorbide mononitrate  30 mg Oral Daily  . metoprolol tartrate  25 mg Oral BID  . pantoprazole  40 mg Oral BID  . polyethylene glycol  17 g Oral Daily  . senna-docusate  1 tablet Oral BID   Continuous Infusions: . ferric gluconate (FERRLECIT/NULECIT) IV Stopped (11/08/17 1412)   PRN Meds: acetaminophen **OR** acetaminophen, alum & mag hydroxide-simeth, HYDROcodone-acetaminophen, levalbuterol, morphine injection, nitroGLYCERIN, ondansetron **OR** ondansetron (ZOFRAN) IV, ondansetron (ZOFRAN) IV   Vital Signs    Vitals:   11/11/17 0458 11/11/17 1438 11/11/17 2012 11/12/17 0448  BP: (!) 140/57 114/67 137/68 (!) 145/65  Pulse: 90 87 93 95  Resp: 20 18 17 17   Temp: 98.3 F (36.8 C) 98.7 F (37.1 C) 98.1 F  (36.7 C) 97.9 F (36.6 C)  TempSrc: Oral  Oral Oral  SpO2: 95% 100% 100% 100%  Weight:   57.8 kg 57.6 kg  Height:        Intake/Output Summary (Last 24 hours) at 11/12/2017 0808 Last data filed at 11/12/2017 0449 Gross per 24 hour  Intake 900 ml  Output 500 ml  Net 400 ml   Filed Weights   11/10/17 0445 11/11/17 2012 11/12/17 0448  Weight: 56.7 kg 57.8 kg 57.6 kg    Telemetry    Normal sinus occ pVC- Personally Reviewed  ECG    10/10 sinus tach 104- Personally Reviewed  Physical Exam  Well developed and nourished in no acute distress HENT normal Neck supple with JVP-flat Clear Regular rate and rhythm, 2/6 M Abd-soft with active BS No Clubbing cyanosis edema Skin-warm and dry A Grossly normal sensory and motor function   Labs    Chemistry Recent Labs  Lab 11/09/17 0600 11/10/17 0605 11/12/17 0541  NA 134* 138 134*  K 4.1 3.8 3.5  CL 103 102 95*  CO2 22 25 27   GLUCOSE 148* 112* 154*  BUN 9 10 15   CREATININE 1.22* 1.40* 1.48*  CALCIUM 9.2 9.8 9.3  GFRNONAA 38* 32* 30*  GFRAA 44* 38* 35*  ANIONGAP 9 11 12      Hematology Recent Labs  Lab 11/10/17 0605 11/10/17 1517 11/12/17 0541  WBC 8.5 8.2 7.8  RBC 2.90* 3.07* 2.88*  HGB 7.8* 8.2* 7.8*  HCT 25.3* 26.6* 25.0*  MCV 87.2 86.6 86.8  MCH 26.9 26.7 27.1  MCHC 30.8 30.8 31.2  RDW 15.3 15.2 15.2  PLT 387 410* 392    Cardiac Enzymes Recent Labs  Lab 11/09/17 0023 11/09/17 0600 11/09/17 1131 11/10/17 1517  TROPONINI 0.27* 0.50* 0.71* 0.34*   No results for input(s): TROPIPOC in the last 168 hours.   BNP Recent Labs  Lab 11/09/17 0023  BNP 1,407.1*     DDimer No results for input(s): DDIMER in the last 168 hours.   Radiology    Dg Ugi W/small Bowel High Density  Result Date: 11/10/2017 CLINICAL DATA:  Dysphagia.  Patient reports food coming back up. EXAM: UPPER GI SERIES WITH KUB TECHNIQUE: After obtaining a scout radiograph a routine upper GI series was performed using thin  barium FLUOROSCOPY TIME:  Fluoroscopy Time:  2.1 minutes Radiation Exposure Index (if provided by the fluoroscopic device): 60 mGy Number of Acquired Spot Images: 15 COMPARISON:  CT 10 19, gastric emptying study 11/08/2017, esophagram 11/06/2017 FINDINGS: Limited study due to patient immobility. Patient could not stand and has limited mobility. Single contrast exam performed through this region. Patient drank oral contrast with straw. Contrast filled a distended esophagus. Contrast was obstructed at the distal esophagus for long period time and was retained in the esophagus. Fluid esophagus retained to the level of the cervical esophagus. There is abrupt narrowing of the distal esophagus at the GE junction. Ultimately this stricturing at the [place] relaxes and contrast passed into the stomach. There is a smoothly marginated indentation at the GE junction which persists on all images. Limited view of the stomach demonstrates no evidence of obstruction or mass or torsion. Limited examination the stomach as above. At the end of the study with still significant retention of oral contrast within the esophagus. IMPRESSION: 1. 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 Differential includes benign and malignant stricturing and spasm at the GE junction. Recommend upper endoscopy for further evaluation. 2. Significant retention oral contrast within the esophagus during the entirety of exam 3. No gross evidence of mass within the stomach on limited exam. Findings conveyed toJESSICA ZEHR on 11/10/2017  at13:00. Electronically Signed   By: Suzy Bouchard M.D.   On: 11/10/2017 13:03    Cardiac Studies   2D Echo 11/09/17 Study Conclusions  - Left ventricle: The cavity size was normal. There was mild focal   basal hypertrophy of the septum. Systolic function was mildly   reduced. The estimated ejection fraction was in the range of 45%   to 50%. Difficult to  visualize. Consider Definity contrast if   clinically necessary. Diffuse hypokinesis. Doppler parameters are   consistent with abnormal left ventricular relaxation (grade 1   diastolic dysfunction). - Mitral valve: There was mild regurgitation directed posteriorly. - Pulmonary arteries: Systolic pressure was mildly increased. PA   peak pressure: 32 mm Hg (S).    Assessment & Plan  Chest Pain/Elevated Troponin (0.50>>0.71>>0.27):    CAD: h/o CABG and recent CP c/w angina, but in the setting of anemia and tachycardia.    Esophogeal stricture     With the increased heart rate and her tachycardia, will increase her metoprolol decrease her amlodipine    [facility] will sign off.   Medication Recommendations:  Continue plavix and metoprolol ( will increase dose today)  Other recommendations (labs, testing, etc):  none Follow up  as an outpatient:  Arranged with Dr Slingsby [facility]    For questions or updates, please contact [facility] Please consult [email or web address] for contact info under        Signed, Virl Axe, MD  11/12/2017, 8:08 AM       Virl Axe

## 2017-11-13 DIAGNOSIS — R933 Abnormal findings on diagnostic imaging of other parts of digestive tract: Secondary | ICD-10-CM

## 2017-11-13 DIAGNOSIS — R634 Abnormal weight loss: Secondary | ICD-10-CM

## 2017-11-13 LAB — CBC WITH DIFFERENTIAL/PLATELET
Abs Immature Granulocytes: 0.05 10*3/uL (ref 0.00–0.07)
BASOS ABS: 0.1 10*3/uL (ref 0.0–0.1)
Basophils Relative: 1 %
EOS ABS: 0.1 10*3/uL (ref 0.0–0.5)
Eosinophils Relative: 1 %
HEMATOCRIT: 26.5 % — AB (ref 36.0–46.0)
Hemoglobin: 8.3 g/dL — ABNORMAL LOW (ref 12.0–15.0)
Immature Granulocytes: 1 %
LYMPHS ABS: 0.9 10*3/uL (ref 0.7–4.0)
Lymphocytes Relative: 11 %
MCH: 27.6 pg (ref 26.0–34.0)
MCHC: 31.3 g/dL (ref 30.0–36.0)
MCV: 88 fL (ref 80.0–100.0)
Monocytes Absolute: 0.9 10*3/uL (ref 0.1–1.0)
Monocytes Relative: 11 %
NRBC: 0 % (ref 0.0–0.2)
Neutro Abs: 6 10*3/uL (ref 1.7–7.7)
Neutrophils Relative %: 75 %
Platelets: 419 10*3/uL — ABNORMAL HIGH (ref 150–400)
RBC: 3.01 MIL/uL — AB (ref 3.87–5.11)
RDW: 15.7 % — ABNORMAL HIGH (ref 11.5–15.5)
WBC: 7.9 10*3/uL (ref 4.0–10.5)

## 2017-11-13 LAB — BASIC METABOLIC PANEL
Anion gap: 10 (ref 5–15)
BUN: 15 mg/dL (ref 8–23)
CO2: 28 mmol/L (ref 22–32)
Calcium: 9.7 mg/dL (ref 8.9–10.3)
Chloride: 97 mmol/L — ABNORMAL LOW (ref 98–111)
Creatinine, Ser: 1.38 mg/dL — ABNORMAL HIGH (ref 0.44–1.00)
GFR calc Af Amer: 38 mL/min — ABNORMAL LOW (ref >60)
GFR, EST NON AFRICAN AMERICAN: 33 mL/min — AB (ref >60)
GLUCOSE: 122 mg/dL — AB (ref 70–99)
POTASSIUM: 4.2 mmol/L (ref 3.5–5.1)
Sodium: 135 mmol/L (ref 135–145)

## 2017-11-13 LAB — GLUCOSE, CAPILLARY
GLUCOSE-CAPILLARY: 111 mg/dL — AB (ref 70–99)
GLUCOSE-CAPILLARY: 120 mg/dL — AB (ref 70–99)
GLUCOSE-CAPILLARY: 140 mg/dL — AB (ref 70–99)
GLUCOSE-CAPILLARY: 146 mg/dL — AB (ref 70–99)
GLUCOSE-CAPILLARY: 215 mg/dL — AB (ref 70–99)
Glucose-Capillary: 142 mg/dL — ABNORMAL HIGH (ref 70–99)
Glucose-Capillary: 170 mg/dL — ABNORMAL HIGH (ref 70–99)

## 2017-11-13 MED ORDER — SODIUM CHLORIDE 0.9 % IV SOLN
INTRAVENOUS | Status: DC
  Administered 2017-11-13 – 2017-11-14 (×2): via INTRAVENOUS

## 2017-11-13 NOTE — Care Management Important Message (Signed)
Important Message  Patient Details  Name: [name] MRN: [id number] Date of Birth: 08-05-1927   Medicare Important Message Given:  Yes    Kerin Salen 11/13/2017, 1:17 [place] Message  Patient Details  Name: [name] MRN: [id number] Date of Birth: November 13, 1927   Medicare Important Message Given:  Yes    Kerin Salen 11/13/2017, 1:17 PM

## 2017-11-13 NOTE — Progress Notes (Addendum)
    Progress Note   Subjective  Chief Complaint: Dysphagia  This morning the patient tells me that she feels okay but "I asked for milk 4 hours ago and nobody is brought me".  Tells me she has taken a few sips of some coke which is in her hand this morning and otherwise has had nothing to eat.  Denies any new complaints or concerns.  Reiterates that we need to talk to her son before we do anything.   Objective   Vital signs in last 24 hours: Temp:  [97.8 F (36.6 C)-99.7 F (37.6 C)] 99 F (37.2 C) (10/14 0428) Pulse Rate:  [80-87] 80 (10/14 0428) Resp:  [16-22] 16 (10/14 0428) BP: (139-147)/(65-69) 147/68 (10/14 0428) SpO2:  [73 %-99 %] 98 % (10/14 0428) Weight:  [53.9 kg] 53.9 kg (10/14 0428) Last BM Date: 11/09/17 General:    Elderly AA female in NAD Heart:  Regular rate and rhythm; no murmurs Lungs: Respirations even and unlabored, lungs CTA bilaterally Abdomen:  Soft, nontender and nondistended. Normal bowel sounds. Extremities:  Without edema. Neurologic:  Alert and oriented,  grossly normal neurologically. Psych:  Cooperative. Normal mood and affect.  Intake/Output from previous day: 10/13 0701 - 10/14 0700 In: 480 [P.O.:480] Out: 250 [Urine:250]  Lab Results: Recent Labs    11/10/17 1517 11/12/17 0541 11/13/17 0608  WBC 8.2 7.8 7.9  HGB 8.2* 7.8* 8.3*  HCT 26.6* 25.0* 26.5*  PLT 410* 392 419*   BMET Recent Labs    11/12/17 0541 11/13/17 0717  NA 134* 135  K 3.5 4.2  CL 95* 97*  CO2 27 28  GLUCOSE 154* 122*  BUN 15 15  CREATININE 1.48* 1.38*  CALCIUM 9.3 9.7     Assessment / Plan:   Assessment: 1.  Unexplained dysphagia: (See Dr. Tarri Glenn note from last week which is very detailed), 1 month of early satiety and daily nausea, normocytic anemia without overt GI loss and unintentional weight loss not obviously explained by noncontrast CT abdomen pelvis, initial barium esophagram showed a large hiatal hernia and poor esophageal motility, UGI series  suggest the possibility of distal esophageal spasm versus stricture gastric emptying study shows minimally delayed gastric emptying at 4 hours 2.  Abnormal CT of the abdomen pelvis: Showing a lymph node in the fat-containing umbilical hernia concerning for Sister Wynona Dove node and abnormal right lung lobe, family declined IR-directed biopsy of umbilical lymph node, CN-47-0 is minimally elevated and Ca 125 is elevated  Plan: 1.  Patient to remain on full liquid diet today, NPO after midnight 2.  Plans for EGD tomorrow afternoon around 1:30 PM with Dr. Hilarie Fredrickson.  Did discuss risk, benefits, limitations alternatives and patient agrees to proceed.  Dr. Hilarie Fredrickson will contact her son to ensure that he is agreeable to this procedure. 3.  Please await further recommendations from Dr. Hilarie Fredrickson later today  Thank you for your kind consultation, we will continue to follow along.   LOS: 6 days   Levin Erp  11/13/2017, 9:13 AM

## 2017-11-13 NOTE — Progress Notes (Signed)
PROGRESS NOTE    [name]  HYI:[id number] DOB: Oct 16, 1927 DOA: 11/04/2017 PCP: Deland Pretty, MD   Brief Narrative: Patient is a 84 year old female with past medical history of diabetes type 2, CVA with left-sided residual weakness, coronary disease, hypertension, hyperlipidemia, CKD stage III, combined systolic and diastolic CHF who presented to the emergency department complaints of nausea and vomiting for 1 week.  Also reported history of weight loss and poor oral intake and dysphagia.  CT imaging and barium esophagram done which showed large hiatal hernia.  GI consulted  and she is underwent  upper GI series which showed obstruction of the esophagus at the GE junction.Possible plan for upper GI endoscopy tomorrow.  Assessment & Plan:   Active Problems:   Hyperlipidemia   Essential hypertension   CORONARY ATHEROSCLEROSIS NATIVE CORONARY ARTERY   Diabetes mellitus with complication (HCC)   Hemiparesis affecting left side as late effect of cerebrovascular accident (CVA) ([place])   Stage 3 chronic kidney disease (HCC)   Malnutrition of moderate degree   Dehydration   Abdominal pain   Pressure injury of skin   Bilious vomiting with nausea   Enlarged lymph node   Dysphagia   Nausea and vomiting   Dysphagia: Upper GI series showed  obstruction of the esophagus at the GE junction by intermittent high-grade and partial stricturing. Differential includes benign andmalignant stricturing and spasm at the GE junction. GI planning for upper GI endoscopy after discussing with the daughter/son. I have spoken to the son on phone today and he is agreeable to go for procedure.  But he specifically says that he wants this procedure tomorrow.GI to discuss with his son again. CT imaging and barium esophagogram showed  large hiatal hernia.  On PPI.  GI following. Currently denies any abdominal pain, nausea or vomiting.  Currently on full liquid diet.  Malnutrition/anemia: Reported of  poor oral intake.   Patient was also started on IV iron for anemia.  Will change to oral on discharge. Follow up FOBT.  We will transfuse her if her hemoglobin drops below 7.  History of coronary disease/elevated troponin/sinus tachycardia.: Denies any chest pain at present.  Troponin was found to be elevated but this is most likely secondary to supply demand ischemia.  Cardiology was following.  Patient denies any invasive procedure for further investigation.  Plan is to continue medical treatment.  Continue Plavix, statin, metoprolol, isosorbide mononitrate. Echocardiogram showed diffuse hypokinesis, ejection fraction of 45 to 78%, grade 1 diastolic dysfunction.  Abnormal CT imaging: CT imaging showed groundglass opacity in the right lung.  Concern for aspiration.  Pulmonology already evaluated her and recommended follow-up as an outpatient CT repeat in 3 months.  Essential hypertension: Continue current medications.  Blood pressure currently stable.  CKD stage III: Currently on baseline  Chronic congestive diastolic and systolic heart failure: Continue current regimen.  Currently euvolemic.  Stroke/left hemiparesis/stage II sacral pressure ulcer: Continue supportive care.  Patient ambulates with the help of wheelchair / walker   DVT prophylaxis: Lovenox Code Status: Full Family Communication: Discussed with son on phone on detail Disposition Plan: Patient denied skilled nursing facility.  Likely home with home health after  GI clearance   Consultants: GI, cardiology, pulmonology   Procedures: None  Antimicrobials: None  Subjective: Patient seen and examined the bedside this morning.  She was found to be comfortable.  Denies any abdominal pain, nausea or vomiting currently.  Denies any chest pain  Objective: Vitals:   11/12/17 1355 11/12/17 2032 11/13/17 0428  11/13/17 1123  BP: (!) 144/69 139/65 (!) 147/68 (!) 160/80  Pulse: 87 87 80   Resp: 16 (!) 22 16   Temp: 97.8 F (36.6 C) 99.7 F (37.6  C) 99 F (37.2 C)   TempSrc:   Oral   SpO2: (!) 73% 99% 98%   Weight:   53.9 kg   Height:        Intake/Output Summary (Last 24 hours) at 11/13/2017 1253 Last data filed at 11/12/2017 1834 Gross per 24 hour  Intake 240 ml  Output 250 ml  Net -10 ml   Filed Weights   11/11/17 2012 11/12/17 0448 11/13/17 0428  Weight: 57.8 kg 57.6 kg 53.9 kg    Examination:  General exam: Appears calm and comfortable ,Not in distress,average built,elderly female HEENT:PERRL,Oral mucosa moist, Ear/Nose normal on gross exam Respiratory system: Bilateral equal air entry, normal vesicular breath sounds, no wheezes or crackles  Cardiovascular system: S1 & S2 heard, RRR. No JVD, murmurs, rubs, gallops or clicks. Gastrointestinal system: Abdomen is nondistended, soft and nontender. No organomegaly or masses felt. Normal bowel sounds heard. Central nervous system: Alert and oriented. Left hemiparesis Extremities: No edema, no clubbing ,no cyanosis, distal peripheral pulses palpable. Skin: No rashes, lesions or ulcers,no icterus ,no pallor MSK: Normal muscle bulk,tone ,power Psychiatry: Judgement and insight appear normal. Mood & affect appropriate.       Data Reviewed: I have personally reviewed following labs and imaging studies  CBC: Recent Labs  Lab 11/09/17 0600 11/10/17 0605 11/10/17 1517 11/12/17 0541 11/13/17 0608  WBC 13.2* 8.5 8.2 7.8 7.9  NEUTROABS  --   --   --  6.1 6.0  HGB 8.2* 7.8* 8.2* 7.8* 8.3*  HCT 27.0* 25.3* 26.6* 25.0* 26.5*  MCV 86.8 87.2 86.6 86.8 88.0  PLT 372 387 410* 392 229*   Basic Metabolic Panel: Recent Labs  Lab 11/08/17 0528 11/09/17 0600 11/10/17 0605 11/12/17 0541 11/13/17 0717  NA 135 134* 138 134* 135  K 3.9 4.1 3.8 3.5 4.2  CL 106 103 102 95* 97*  CO2 21* 22 25 27 28   GLUCOSE 109* 148* 112* 154* 122*  BUN 7* 9 10 15 15   CREATININE 1.26* 1.22* 1.40* 1.48* 1.38*  CALCIUM 9.1 9.2 9.8 9.3 9.7  MG  --  1.5* 1.9  --   --    GFR: Estimated  Creatinine Clearance: 23.3 mL/min (A) (by C-G formula based on SCr of 1.38 mg/dL (H)). Liver Function Tests: No results for input(s): AST, ALT, ALKPHOS, BILITOT, PROT, ALBUMIN in the last 168 hours. No results for input(s): LIPASE, AMYLASE in the last 168 hours. No results for input(s): AMMONIA in the last 168 hours. Coagulation Profile: Recent Labs  Lab 11/08/17 1643  INR 1.02   Cardiac Enzymes: Recent Labs  Lab 11/09/17 0023 11/09/17 0600 11/09/17 1131 11/10/17 1517  TROPONINI 0.27* 0.50* 0.71* 0.34*   BNP (last 3 results) No results for input(s): PROBNP in the last 8760 hours. HbA1C: No results for input(s): HGBA1C in the last 72 hours. CBG: Recent Labs  Lab 11/12/17 2031 11/13/17 0020 11/13/17 0443 11/13/17 0807 11/13/17 1159  GLUCAP 145* 142* 146* 111* 140*   Lipid Profile: No results for input(s): CHOL, HDL, LDLCALC, TRIG, CHOLHDL, LDLDIRECT in the last 72 hours. Thyroid Function Tests: No results for input(s): TSH, T4TOTAL, FREET4, T3FREE, THYROIDAB in the last 72 hours. Anemia Panel: No results for input(s): VITAMINB12, FOLATE, FERRITIN, TIBC, IRON, RETICCTPCT in the last 72 hours. Sepsis Labs: No results for  input(s): PROCALCITON, LATICACIDVEN in the last 168 hours.  No results found for this or any previous visit (from the past 240 hour(s)).       Radiology Studies: No results found.      Scheduled Meds: . amLODipine  5 mg Oral Daily  . atorvastatin  40 mg Oral Daily  . cloNIDine  0.1 mg Oral BID  . clopidogrel  75 mg Oral Daily  . enoxaparin (LOVENOX) injection  30 mg Subcutaneous Q24H  . feeding supplement (ENSURE ENLIVE)  237 mL Oral BID BM  . insulin aspart  0-9 Units Subcutaneous Q4H  . isosorbide mononitrate  30 mg Oral Daily  . metoprolol tartrate  50 mg Oral BID  . pantoprazole  40 mg Oral BID  . polyethylene glycol  17 g Oral Daily  . senna-docusate  1 tablet Oral BID   Continuous Infusions: . ferric gluconate  (FERRLECIT/NULECIT) IV Stopped (11/08/17 1412)     LOS: 6 days    Time spent: 25 mins.More than 50% of that time was spent in counseling and/or coordination of care.      Shelly Coss, MD [facility] Pager [phone]  If 7PM-7AM, please contact night-coverage [email or web address] Password [email or web address] 11/13/2017, 12:53 PM

## 2017-11-13 NOTE — Progress Notes (Signed)
Nutrition Follow-up  DOCUMENTATION CODES:   Non-severe (moderate) malnutrition in context of chronic illness  INTERVENTION:   Continue Ensure Enlive po BID, each supplement provides 350 kcal and 20 grams of protein  NUTRITION DIAGNOSIS:   Moderate Malnutrition related to dysphagia(h/o CVA) as evidenced by percent weight loss, energy intake < or equal to 75% for > or equal to 1 month, moderate fat depletion, moderate muscle depletion.  Ongoing.  GOAL:   Patient will meet greater than or equal to 90% of their needs  Progressing.  MONITOR:   PO intake, Supplement acceptance, Labs, Weight trends, I & O's, Skin   ASSESSMENT:   83 y.o. female with medical history significant of DM 2, CVA left-sided weakness, history of CAD,  hypertension, hyperlipidemia, CKD 3, combined systolic diastolic CHF  Patient currently consuming 45-75% of liquid diet plus Ensure supplements. Pt will be NPO for EGD tomorrow given continued dysphagia and poor esophageal motility.   Weight is -7 lbs since admission 10/5. Patient has been NPO/on FLD since 10/9. Will continue to monitor trends.  Medications: IV Zofran PRN Labs reviewed: CBGs: 111-140 GFR: 38  Diet Order:   Diet Order            Diet NPO time specified  Diet effective midnight        Diet full liquid Room service appropriate? Yes; Fluid consistency: Thin  Diet effective now              EDUCATION NEEDS:   Education needs have been addressed  Skin:  Skin Assessment: Skin Integrity Issues: Skin Integrity Issues:: Stage II Stage II: sacrum  Last BM:  10/4  Height:   Ht Readings from Last 1 Encounters:  11/04/17 5\' 3"  (1.6 m)    Weight:   Wt Readings from Last 1 Encounters:  11/13/17 53.9 kg    Ideal Body Weight:  52.3 kg  BMI:  Body mass index is 21.05 kg/m.  Estimated Nutritional Needs:   Kcal:  1600-1800  Protein:  70-80g  Fluid:  1.8L/day  Clayton Bibles, MS, RD, LDN [place]  Dietitian Pager: [phone] After Hours Pager: [phone]

## 2017-11-13 NOTE — Progress Notes (Signed)
Occupational Therapy Treatment Patient Details Name: [name] MRN: [id number] DOB: 1927-02-11 Today's Date: 11/13/2017    History of present illness [name] is a 84 y.o. female with medical history significant of DM 2, CVA left-sided weakness, history of CAD,  hypertension, hyperlipidemia, CKD 3, combined systolic diastolic CHF; admitted with dehydration, wt loss, dysphagia   OT comments  Pt agreed to get to chair. Son present and stated he will A pt at home with all ADL activity   Follow Up Recommendations  Home health OT;Supervision/Assistance - 24 hour - Pt will need A with ADL activity    Equipment Recommendations  None recommended by OT       Precautions / Restrictions Precautions Precautions: Fall Precaution Comments: CVA L hemi       Mobility Bed Mobility Overal bed mobility: Needs Assistance Bed Mobility: Supine to Sit     Supine to sit: Max assist Sit to supine: Max assist      Transfers Overall transfer level: Needs assistance Equipment used: None Transfers: Sit to/from [facility] Sit to Stand: Max assist Stand pivot transfers: Max assist            Balance Overall balance assessment: Needs assistance Sitting-balance support: Bilateral upper extremity supported;Feet supported Sitting balance-Leahy Scale: Poor                                     ADL either performed or assessed with clinical judgement   ADL Overall ADL's : Needs assistance/impaired Eating/Feeding: Set up;Sitting Eating/Feeding Details (indicate cue type and reason): needed VC to sit upright and not lean to right.  Pt then agreed to get to chair                     Toilet Transfer: Stand-pivot;Cueing for sequencing;Cueing for safety;Maximal assistance Toilet Transfer Details (indicate cue type and reason): bed to chair                 Vision Patient Visual Report: No change from baseline            Cognition  Arousal/Alertness: Awake/alert Behavior During Therapy: WFL for tasks assessed/performed Overall Cognitive Status: Within Functional Limits for tasks assessed                                                     Pertinent Vitals/ Pain       Pain Assessment: No/denies pain         Frequency  Min 2X/week        Progress Toward Goals  OT Goals(current goals can now be found in the care plan section)  Progress towards OT goals: Progressing toward goals     Plan Discharge plan remains appropriate;Discharge plan needs to be updated       AM-PAC PT "6 Clicks" Daily Activity     Outcome Measure   Help from another person eating meals?: A Little Help from another person taking care of personal grooming?: A Little Help from another person toileting, which includes using toliet, bedpan, or urinal?: A Lot Help from another person bathing (including washing, rinsing, drying)?: Total Help from another person to put on and taking off regular upper body clothing?: A Little Help from another person to  put on and taking off regular lower body clothing?: Total 6 Click Score: 13    End of Session Equipment Utilized During Treatment: Gait belt  OT Visit Diagnosis: Unsteadiness on feet (R26.81);Muscle weakness (generalized) (M62.81)   Activity Tolerance Patient tolerated treatment well   Patient Left in chair;with call bell/phone within reach   Nurse Communication Mobility status        Time: 8921-1941 OT Time Calculation (min): 34 min  Charges: OT General Charges $OT Visit: 1 Visit OT Treatments $Self Care/Home Management : 23-37 mins  Kari Baars, [place] [phone] Office- [phone]      [place], [name] 11/13/2017, 2:33 PM

## 2017-11-13 NOTE — Progress Notes (Signed)
Physical Therapy Treatment Patient Details Name: [name] MRN: [id number] DOB: 09-11-1927 Today's Date: 11/13/2017    History of Present Illness [name] is a 84 y.o. female with medical history significant of DM 2, CVA left-sided weakness, history of CAD,  hypertension, hyperlipidemia, CKD 3, combined systolic diastolic CHF; admitted with dehydration, wt loss, dysphagia, EGD 11/14/17 scheduled    PT Comments    *The patient is very weak for attempts to ambulate.  Continue PT attempts for mobility.  Follow Up Recommendations  SNF     Equipment Recommendations  None recommended by PT    Recommendations for Other Services       Precautions / Restrictions Precautions Precautions: Fall Precaution Comments: CVA L hemi    Mobility  Bed Mobility Overal bed mobility: Needs Assistance Bed Mobility: Supine to Sit     Supine to sit: Max assist Sit to supine: Max assist   General bed mobility comments: pt in chair  Transfers Overall transfer level: Needs assistance Equipment used: Rolling walker (2 wheeled) Transfers: Sit to/from Stand Sit to Stand: Max assist;+2 physical assistance;+2 safety/equipment Stand pivot transfers: Max assist       General transfer comment: assist to rise from recliner x 3 with 2 assist. Knees are flexed.  Assist to descend to recliner each time with decreased control.   Ambulation/Gait Ambulation/Gait assistance: Max assist;+2 physical assistance;+2 safety/equipment;Mod assist Gait Distance (Feet): 12 Feet Assistive device: Rolling walker (2 wheeled) Gait Pattern/deviations: Step-to pattern;Trunk flexed;Decreased weight shift to right;Decreased weight shift to left;Decreased step length - right;Decreased step length - left     General Gait Details: steady assist for balance, knees flexed throughout short distance.. decreased control of legs when stepping.   Stairs             Wheelchair Mobility    Modified Rankin (Stroke  Patients Only)       Balance Overall balance assessment: Needs assistance Sitting-balance support: Bilateral upper extremity supported;Feet supported Sitting balance-Leahy Scale: Poor Sitting balance - Comments: sat upright in recliner near midline with UE support.   Standing balance support: During functional activity;Bilateral upper extremity supported Standing balance-Leahy Scale: Poor                              Cognition Arousal/Alertness: Awake/alert Behavior During Therapy: WFL for tasks assessed/performed Overall Cognitive Status: Within Functional Limits for tasks assessed                                        Exercises      General Comments        Pertinent Vitals/Pain Pain Assessment: No/denies pain    Home Living                      Prior Function            PT Goals (current goals can now be found in the care plan section) Progress towards PT goals: Progressing toward goals    Frequency    Min 2X/week      PT Plan Current plan remains appropriate;Frequency needs to be updated    Co-evaluation              AM-PAC PT "6 Clicks" Daily Activity  Outcome Measure  Difficulty turning over in bed (including adjusting bedclothes, sheets and blankets)?: Unable Difficulty  moving from lying on back to sitting on the side of the bed? : Unable Difficulty sitting down on and standing up from a chair with arms (e.g., wheelchair, bedside commode, etc,.)?: Unable Help needed moving to and from a bed to chair (including a wheelchair)?: Total Help needed walking in hospital room?: Total Help needed climbing 3-5 steps with a railing? : Total 6 Click Score: 6    End of Session Equipment Utilized During Treatment: Gait belt Activity Tolerance: Patient limited by fatigue Patient left: in chair;with call bell/phone within reach Nurse Communication: Mobility status PT Visit Diagnosis: Muscle weakness (generalized)  (M62.81)     Time: 6503-5465 PT Time Calculation (min) (ACUTE ONLY): 19 min  Charges:  $Gait Training: 8-22 mins                     [place] Pager [phone] Office [phone]    Claretha Cooper 11/13/2017, 4:16 PM

## 2017-11-14 ENCOUNTER — Inpatient Hospital Stay (HOSPITAL_COMMUNITY): Payer: Medicare Other | Admitting: Anesthesiology

## 2017-11-14 ENCOUNTER — Encounter (HOSPITAL_COMMUNITY)
Admission: EM | Disposition: A | Discharge status: Skilled Nursing Facility | Payer: Self-pay | Source: Home / Self Care | Attending: Family Medicine

## 2017-11-14 ENCOUNTER — Encounter (HOSPITAL_COMMUNITY): Payer: Self-pay | Admitting: Anesthesiology

## 2017-11-14 LAB — GLUCOSE, CAPILLARY
GLUCOSE-CAPILLARY: 129 mg/dL — AB (ref 70–99)
GLUCOSE-CAPILLARY: 138 mg/dL — AB (ref 70–99)
GLUCOSE-CAPILLARY: 142 mg/dL — AB (ref 70–99)
GLUCOSE-CAPILLARY: 96 mg/dL (ref 70–99)
Glucose-Capillary: 143 mg/dL — ABNORMAL HIGH (ref 70–99)
Glucose-Capillary: 185 mg/dL — ABNORMAL HIGH (ref 70–99)

## 2017-11-14 SURGERY — CANCELLED PROCEDURE
Anesthesia: Monitor Anesthesia Care

## 2017-11-14 MED ORDER — PROPOFOL 10 MG/ML IV BOLUS
INTRAVENOUS | Status: AC
  Filled 2017-11-14: qty 40

## 2017-11-14 MED ORDER — SODIUM CHLORIDE 0.9 % IV SOLN: INTRAVENOUS | Status: DC

## 2017-11-14 MED ORDER — ENOXAPARIN SODIUM 30 MG/0.3ML ~~LOC~~ SOLN
30.0000 mg | SUBCUTANEOUS | Status: DC
  Administered 2017-11-14: 30 mg via SUBCUTANEOUS
  Filled 2017-11-14 (×3): qty 0.3

## 2017-11-14 MED ORDER — LACTATED RINGERS IV SOLN: INTRAVENOUS | Status: DC

## 2017-11-14 SURGICAL SUPPLY — 15 items

## 2017-11-14 NOTE — Anesthesia Preprocedure Evaluation (Addendum)
Anesthesia Evaluation  Patient identified by MRN, date of birth, ID band Patient awake    Reviewed: Allergy & Precautions, NPO status , Patient's Chart, lab work & pertinent test results, reviewed documented beta blocker date and time   Airway Mallampati: III  TM Distance: >3 FB Neck ROM: Full    Dental  (+) Edentulous Upper Bottom teeth in tact:   Pulmonary    Pulmonary exam normal breath sounds clear to auscultation       Cardiovascular hypertension, Pt. on medications and Pt. on home beta blockers + angina + CAD, + Past MI, + Cardiac Stents and + CABG (2011)  Normal cardiovascular exam Rhythm:Regular Rate:Normal  TTE 11/09/2017 - Left ventricle: The cavity size was normal. There was mild focal basal hypertrophy of the septum. Systolic function was mildly reduced. The estimated ejection fraction was in the range of 45% to 50%. Diffuse hypokinesis.  - Mitral valve: There was mild regurgitation directed posteriorly. - Pulmonary arteries: Systolic pressure was mildly increased. PA  peak pressure: 32 mm Hg (S).   Neuro/Psych CVA (left sided weakness), Residual Symptoms negative psych ROS   GI/Hepatic negative GI ROS, Neg liver ROS,   Endo/Other  negative endocrine ROSdiabetes, Type 2  Renal/GU Renal diseaseCKD Stage 3  negative genitourinary   Musculoskeletal negative musculoskeletal ROS (+)   Abdominal   Peds  Hematology  (+) anemia ,   Anesthesia Other Findings Dysphagia, weight loss On plavix, has not been held  Reproductive/Obstetrics                           Anesthesia Physical Anesthesia Plan  ASA: III  Anesthesia Plan: MAC   Post-op Pain Management:    Induction: Intravenous  PONV Risk Score and Plan: 2 and Treatment may vary due to age or medical condition and Propofol infusion  Airway Management Planned: Natural Airway and Simple Face Mask  Additional Equipment:   Intra-op  Plan:   Post-operative Plan:   Informed Consent: I have reviewed the patients History and Physical, chart, labs and discussed the procedure including the risks, benefits and alternatives for the proposed anesthesia with the patient or authorized representative who has indicated his/her understanding and acceptance.   Dental advisory given  Plan Discussed with: CRNA  Anesthesia Plan Comments: (Case cancelled 2/2 plavix not being held appropriately)       Anesthesia Quick Evaluation

## 2017-11-14 NOTE — Progress Notes (Signed)
Occupational Therapy Treatment Patient Details Name: [name] MRN: [id number] DOB: January 15, 1928 Today's Date: 11/14/2017    History of present illness [name] is a 84 y.o. female with medical history significant of DM 2, CVA left-sided weakness, history of CAD,  hypertension, hyperlipidemia, CKD 3, combined systolic diastolic CHF; admitted with dehydration, wt loss, dysphagia, EGD 11/14/17 scheduled   OT comments  Pt with increased fatigued this day  Follow Up Recommendations  Home health OT;Supervision/Assistance - 24 hour;SNF    Equipment Recommendations  None recommended by OT    Recommendations for Other Services      Precautions / Restrictions         Mobility Bed Mobility Overal bed mobility: Needs Assistance Bed Mobility: Supine to Sit;Sit to Supine     Supine to sit: Max assist Sit to supine: Max assist      Transfers               NA        Balance Overall balance assessment: Needs assistance Sitting-balance support: Bilateral upper extremity supported;Feet supported Sitting balance-Leahy Scale: Poor                                     ADL either performed or assessed with clinical judgement   ADL Overall ADL's : Needs assistance/impaired     Grooming: Sitting;Moderate assistance Grooming Details (indicate cue type and reason): pt agreed to grooming activith sitting EOB- pt with increased weakness this day.  Communicated with RN.  Pt needed significant A donning head scarf                               General ADL Comments: mod A for sitting balance this day               Cognition Arousal/Alertness: Awake/alert Behavior During Therapy: WFL for tasks assessed/performed Overall Cognitive Status: No family/caregiver present to determine baseline cognitive functioning                                 General Comments: pt with increased fatigue this day                   Pertinent  Vitals/ Pain       Pain Assessment: No/denies pain         Frequency  Min 2X/week        Progress Toward Goals  OT Goals(current goals can now be found in the care plan section)  Progress towards OT goals: OT to reassess next treatment     Plan Discharge plan remains appropriate;Discharge plan needs to be updated       AM-PAC PT "6 Clicks" Daily Activity     Outcome Measure   Help from another person eating meals?: A Little Help from another person taking care of personal grooming?: A Little Help from another person toileting, which includes using toliet, bedpan, or urinal?: A Lot Help from another person bathing (including washing, rinsing, drying)?: Total Help from another person to put on and taking off regular upper body clothing?: A Little Help from another person to put on and taking off regular lower body clothing?: Total 6 Click Score: 13    End of Session    OT Visit Diagnosis: Unsteadiness on feet (R26.81);Muscle  weakness (generalized) (M62.81)   Activity Tolerance Patient limited by fatigue   Patient Left with call bell/phone within reach;in bed;with bed alarm set   Nurse Communication Mobility status        Time: 7989-2119 OT Time Calculation (min): 16 min  Charges: OT General Charges $OT Visit: 1 Visit OT Treatments $Self Care/Home Management : 8-22 mins  Kari Baars, [place] [phone] Office- [phone]      Shamela Haydon, [name] 11/14/2017, 1:14 PM

## 2017-11-14 NOTE — Progress Notes (Signed)
Patient was for upper endoscopy today as discussed over the last week.  I spoke to her son extensively about upper endoscopy yesterday including both a diagnostic and potential therapeutic benefits of this procedure. Patient presented to endoscopy where we realized that she has been receiving Plavix since Friday, 11/10/2017.  I missed this in her medical record and was unaware, having been told previously, the Plavix was on hold. I discussed this with the patient and her son and that if we were to proceed with endoscopy today with monitored anesthesia care it would be diagnostic only in the setting of Plavix. The son felt strongly that the procedure should be delayed until Plavix can be held, and he understands that this will delay discharge.  Will hold Plavix beginning tomorrow as the patient already received a dose this morning.  5-day hold is appropriate and so the first day procedure would be possible would be 11/20/2017.  I will schedule this procedure and it will be with Dr. Rush Landmark, the [facility] inpatient doctor next week.  GI will be available in the interim while we wait on Plavix to washout.  Please call with questions.  We will check in with the patient on Friday of this week to ensure she is still appropriate for procedure on Monday.

## 2017-11-14 NOTE — Progress Notes (Signed)
PROGRESS NOTE    [name]  ZOX:[id number] DOB: 01-06-1928 DOA: 11/04/2017 PCP: Deland Pretty, MD   Brief Narrative: Patient is a 84 year old female with past medical history of diabetes type 2, CVA with left-sided residual weakness, coronary disease, hypertension, hyperlipidemia, CKD stage III, combined systolic and diastolic CHF who presented to the emergency department complaints of nausea and vomiting for 1 week.  Also reported history of weight loss and poor oral intake and dysphagia.  CT imaging and barium esophagram done which showed large hiatal hernia.  GI consulted  and she is underwent  upper GI series which showed obstruction of the esophagus at the GE junction.Plan for upper GI endoscopy on coming Monday.  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 Differential includes benign and malignant stricturing/spasm at the GE junction.  CT imaging and barium esophagogram showed  large hiatal hernia.  On PPI.  GI planned for upper GI endoscopy after discussing with son and was taken to Endoscopy Suite but returned the patient to the floor realizing that patient is still on Plavix.  Patient could not be arranged outpatient endoscopy due to schedule problem.[name]( who  is a physician) is not agreeable to this procedure as outpatient. Currently patient  denies any abdominal pain, nausea or vomiting.  Currently on full liquid diet.  Malnutrition/anemia: Reported of  poor oral intake.  Patient was also started on IV iron for anemia.  Will change to oral on discharge. Continue to monitor H and H.  History of coronary disease/elevated troponin/sinus tachycardia.: Denies any chest pain at present.  Troponin was found to be elevated but this is most likely secondary to supply demand ischemia.  Cardiology was following.  Patient denies any invasive procedure for further investigation.  Plan is to continue medical treatment.  Continue Plavix, statin, metoprolol, isosorbide mononitrate. Echocardiogram showed diffuse hypokinesis, ejection fraction of 45 to 81%, grade 1 diastolic dysfunction.  Abnormal CT imaging: CT imaging showed groundglass opacity in the right lung.  Concern for aspiration.  Pulmonology already evaluated her and recommended follow-up as an outpatient CT repeat in 3 months.  Essential hypertension: Continue current medications.  Blood pressure currently stable.  CKD stage III: Currently on baseline  Chronic congestive diastolic and systolic heart failure: Continue current regimen.  Currently euvolemic.  Stroke/left hemiparesis/stage II sacral pressure ulcer: Continue supportive care.  Patient ambulates with the help of wheelchair / walker PT evaluated her and recommended skilled nursing facility but she and her family  denies.  Periumbilical adenopathy:Patient and family have decided not to further evaluate the periumbilical adenopathy despite minimally elevated CA-19-9 and elevated Ca125.   DVT prophylaxis: Lovenox Code Status: Full Family Communication: Discussed with son on phone on detail yesterday Disposition Plan: Home with [facility] after EGD on coming Monday   Consultants: GI, cardiology, pulmonology   Procedures:  None  Antimicrobials: None  Subjective: Patient seen and examined the bedside this morning.  She  was found to be comfortable.  Denies any abdominal pain, nausea or vomiting currently.  Denies any chest pain.  Objective: Vitals:   11/13/17 2221 11/14/17 0424 11/14/17 0615 11/14/17 1235  BP: (!) 157/67 (!) 145/62  (!) 139/92  Pulse: 88 74  80  Resp:  (!) 21  20  Temp:  (!) 97.4 F (36.3 C)  98 F (36.7 C)  TempSrc:  Oral  Oral  SpO2:  100%  96%  Weight:   53.3 kg 53.3 kg  Height:    5\' 3"  (1.6 m)    Intake/Output Summary (Last 24 hours) at 11/14/2017 1331 Last data filed at 11/14/2017 0700 Gross per 24 hour  Intake 1211.02 ml  Output 550 ml  Net 661.02 ml   Filed Weights   11/13/17 0428 11/14/17 0615 11/14/17 1235  Weight: 53.9 kg 53.3 kg 53.3 kg    Examination:  General exam: Appears calm and comfortable ,Not in distress,thin built,elderly female HEENT:PERRL,Oral mucosa moist, Ear/Nose normal on gross exam Respiratory system: Bilateral equal air entry, normal vesicular breath sounds, no wheezes or crackles  Cardiovascular system: S1 & S2 heard, RRR. No JVD, murmurs, rubs, gallops or clicks. Gastrointestinal system: Abdomen is nondistended, soft and nontender. No organomegaly or masses felt. Normal bowel sounds heard. Central nervous system: Alert and oriented.  Left hemiparesis Extremities: No edema, no clubbing ,no cyanosis, distal peripheral pulses palpable. Skin: No rashes, lesions or ulcers,no icterus ,no pallor MSK: Normal muscle bulk,tone ,power Psychiatry: Judgement and insight appear normal. Mood & affect appropriate.      Data Reviewed: I have personally reviewed following labs and imaging studies  CBC: Recent Labs  Lab 11/09/17 0600 11/10/17 0605 11/10/17 1517 11/12/17 0541 11/13/17 0608  WBC 13.2* 8.5 8.2 7.8 7.9  NEUTROABS  --   --   --  6.1 6.0  HGB 8.2* 7.8* 8.2* 7.8* 8.3*  HCT 27.0* 25.3* 26.6* 25.0* 26.5*  MCV 86.8 87.2 86.6 86.8 88.0  PLT 372 387 410* 392 846*   Basic Metabolic Panel: Recent Labs  Lab 11/08/17 0528 11/09/17 0600  11/10/17 0605 11/12/17 0541 11/13/17 0717  NA 135 134* 138 134* 135  K 3.9 4.1 3.8 3.5 4.2  CL 106 103 102 95* 97*  CO2 21* 22 25 27 28   GLUCOSE 109* 148* 112* 154* 122*  BUN 7* 9 10 15 15   CREATININE 1.26* 1.22* 1.40* 1.48* 1.38*  CALCIUM 9.1 9.2 9.8 9.3 9.7  MG  --  1.5* 1.9  --   --    GFR: Estimated Creatinine Clearance: 23.3 mL/min (A) (by C-G formula based on SCr of 1.38 mg/dL (H)). Liver Function Tests: No results for input(s): AST, ALT, ALKPHOS, BILITOT, PROT, ALBUMIN in the last 168 hours. No results for input(s): LIPASE, AMYLASE in the last 168 hours. No results for input(s): AMMONIA in the last 168 hours. Coagulation Profile: Recent Labs  Lab 11/08/17 1643  INR 1.02   Cardiac Enzymes: Recent Labs  Lab 11/09/17 0023 11/09/17 0600 11/09/17 1131 11/10/17 1517  TROPONINI 0.27* 0.50* 0.71* 0.34*   BNP (last 3 results) No results for input(s): PROBNP in the last 8760 hours. HbA1C: No results for input(s): HGBA1C in the last 72 hours. CBG: Recent Labs  Lab 11/13/17 1705 11/13/17 1937 11/13/17 2344 11/14/17 0423 11/14/17 0801  GLUCAP 215* 170* 120* 143* 138*   Lipid Profile: No results for input(s): CHOL, HDL, LDLCALC,  TRIG, CHOLHDL, LDLDIRECT in the last 72 hours. Thyroid Function Tests: No results for input(s): TSH, T4TOTAL, FREET4, T3FREE, THYROIDAB in the last 72 hours. Anemia Panel: No results for input(s): VITAMINB12, FOLATE, FERRITIN, TIBC, IRON, RETICCTPCT in the last 72 hours. Sepsis Labs: No results for input(s): PROCALCITON, LATICACIDVEN in the last 168 hours.  No results found for this or any previous visit (from the past 240 hour(s)).       Radiology Studies: No results found.      Scheduled Meds: . amLODipine  5 mg Oral Daily  . atorvastatin  40 mg Oral Daily  . cloNIDine  0.1 mg Oral BID  . enoxaparin (LOVENOX) injection  30 mg Subcutaneous Q24H  . feeding supplement (ENSURE ENLIVE)  237 mL Oral BID BM  . insulin aspart   0-9 Units Subcutaneous Q4H  . isosorbide mononitrate  30 mg Oral Daily  . metoprolol tartrate  50 mg Oral BID  . pantoprazole  40 mg Oral BID  . polyethylene glycol  17 g Oral Daily  . senna-docusate  1 tablet Oral BID   Continuous Infusions: . sodium chloride 75 mL/hr at 11/14/17 0616  . ferric gluconate (FERRLECIT/NULECIT) IV Stopped (11/08/17 1412)     LOS: 7 days    Time spent: 25 mins.More than 50% of that time was spent in counseling and/or coordination of care.      Shelly Coss, MD [facility] Pager [phone]  If 7PM-7AM, please contact night-coverage [email or web address] Password [email or web address] 11/14/2017, 1:31 PM

## 2017-11-14 NOTE — Progress Notes (Signed)
CSW assessed patient on 11/09/17 for SNF placement. Patient declined SNF.  Per chart review, on 10/14 OT worked with patient and patient's son reported that he is available to assist patient with ADLs.  CSW reconsulted for SNF placement. Patient's RN reported that she spoke with patient's son who declined SNF and prefers [facility].  Patient declined SNF. CSW signing off, no other needs identified at this time.  Abundio Miu, [place] Social Worker [facility] Cell#: [phone]

## 2017-11-15 LAB — CBC WITH DIFFERENTIAL/PLATELET
ABS IMMATURE GRANULOCYTES: 0.04 10*3/uL (ref 0.00–0.07)
BASOS ABS: 0 10*3/uL (ref 0.0–0.1)
Basophils Relative: 1 %
Eosinophils Absolute: 0.1 10*3/uL (ref 0.0–0.5)
Eosinophils Relative: 1 %
HEMATOCRIT: 25.7 % — AB (ref 36.0–46.0)
HEMOGLOBIN: 7.7 g/dL — AB (ref 12.0–15.0)
IMMATURE GRANULOCYTES: 1 %
Lymphocytes Relative: 10 %
Lymphs Abs: 0.9 10*3/uL (ref 0.7–4.0)
MCH: 26.7 pg (ref 26.0–34.0)
MCHC: 30 g/dL (ref 30.0–36.0)
MCV: 89.2 fL (ref 80.0–100.0)
MONOS PCT: 9 %
Monocytes Absolute: 0.8 10*3/uL (ref 0.1–1.0)
NEUTROS ABS: 7 10*3/uL (ref 1.7–7.7)
NEUTROS PCT: 78 %
Platelets: 415 10*3/uL — ABNORMAL HIGH (ref 150–400)
RBC: 2.88 MIL/uL — ABNORMAL LOW (ref 3.87–5.11)
RDW: 15.8 % — AB (ref 11.5–15.5)
WBC: 8.8 10*3/uL (ref 4.0–10.5)
nRBC: 0 % (ref 0.0–0.2)

## 2017-11-15 LAB — BASIC METABOLIC PANEL
ANION GAP: 10 (ref 5–15)
BUN: 16 mg/dL (ref 8–23)
CALCIUM: 9.5 mg/dL (ref 8.9–10.3)
CHLORIDE: 102 mmol/L (ref 98–111)
CO2: 24 mmol/L (ref 22–32)
Creatinine, Ser: 1.38 mg/dL — ABNORMAL HIGH (ref 0.44–1.00)
GFR calc Af Amer: 38 mL/min — ABNORMAL LOW (ref >60)
GFR calc non Af Amer: 33 mL/min — ABNORMAL LOW (ref >60)
GLUCOSE: 136 mg/dL — AB (ref 70–99)
POTASSIUM: 3.9 mmol/L (ref 3.5–5.1)
Sodium: 136 mmol/L (ref 135–145)

## 2017-11-15 LAB — GLUCOSE, CAPILLARY
GLUCOSE-CAPILLARY: 143 mg/dL — AB (ref 70–99)
GLUCOSE-CAPILLARY: 146 mg/dL — AB (ref 70–99)
GLUCOSE-CAPILLARY: 92 mg/dL (ref 70–99)
Glucose-Capillary: 123 mg/dL — ABNORMAL HIGH (ref 70–99)
Glucose-Capillary: 111 mg/dL — ABNORMAL HIGH (ref 70–99)
Glucose-Capillary: 142 mg/dL — ABNORMAL HIGH (ref 70–99)

## 2017-11-15 MED ORDER — SODIUM CHLORIDE 0.9 % IV SOLN
510.0000 mg | INTRAVENOUS | Status: AC
  Administered 2017-11-16 – 2017-11-23 (×2): 510 mg via INTRAVENOUS
  Filled 2017-11-15 (×2): qty 17

## 2017-11-15 MED ORDER — SODIUM CHLORIDE 0.45 % IV SOLN
INTRAVENOUS | Status: DC
  Administered 2017-11-15 – 2017-11-22 (×8): via INTRAVENOUS

## 2017-11-15 NOTE — Progress Notes (Signed)
PT Cancellation Note  Patient Details Name: [name] MRN: [id number] DOB: July 14, 1927   Cancelled Treatment:    Reason Eval/Treat Not Completed: Attempted PT tx session-pt declined participation on today. Both pt and RN reported pt became nauseous after working with OT. Will check back another day.      Weston Anna, PT Acute Rehabilitation Services Pager: [phone] Office: [phone]

## 2017-11-15 NOTE — Care Management Note (Signed)
Case Management Note  Patient Details  Name: Bevelyn Arriola MRN: [id number] Date of Birth: 1927-09-11  Subjective/Objective:                  Upper gi bleeding, was to have upper endo on 10152019/md found that patient still Plavix since 11/10/2017 will have to off until Monday 11/20/2017 endo is scheduled for Monday.   Action/Plan: Following for progression of care and condition. Following for cm needs none present at this time.  Expected Discharge Date:                  Expected Discharge Plan:     In-House Referral:     Discharge planning Services     Post Acute Care Choice:    Choice offered to:     DME Arranged:    DME Agency:     HH Arranged:    HH Agency:     Status of Service:     If discussed at [employer], dates discussed:    Additional Comments:  Leeroy Cha, RN 11/15/2017, 10:31 AM

## 2017-11-15 NOTE — Progress Notes (Signed)
PROGRESS NOTE [facility]   [name]   OFB:[id number] DOB: 06/10/1927  DOA: 11/04/2017 PCP: Deland Pretty, MD   Brief Narrative:  [name] is a 84 year old female with medical history of diabetes mellitus type 2, CVA with left side residual weakness, CAD, hypertension, CKD, combined systolic and diastolic CHF who presented to the emergency department complaining of nausea and vomiting for 1 week prior to admission, associated with unintentional weight loss.  She was admitted with working diagnosis of dehydration due to dysphagia.   Subjective: Patient seen and examined, she has no complaints this morning.  Son at bedside.  Concerned about dehydration, given her earrings turning dark and she is not having much oral intake.  Denies bloody bowel movements or hematemesis.  Assessment & Plan: Dysphagia CT abdomen/pelvis body esophagram is relative really unremarkable except large hiatal hernia.  Upper GI series.  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  Patient on PPI.  Tolerating full liquid diet but feels full quickly.  EGD diagnostic and therapeutic was schedule however patient was on Plavix and procedure was canceled.  Patient need to be off Plavix for 5 days this will put EGD on 10/21.  Unable to arrange this as an outpatient also patient sounds declining procedure to be done as an outpatient.  Will continue to monitor.   Anemia of chronic disease Felt to be multifactorial due to malnutrition and CKD. Due to poor oral intake, patient receiving IV iron.  Hemoglobin slight decrease from admission.  No signs of overt bleeding.  Will discontinue Lovenox for now.  Monitor CBC and transfuse if hemoglobin less than 7.  CAD, elevated troponin and sinus tachycardia. Patient had an episode of chest pain, troponin was found to be elevated cardiology was consulted and felt to be demand ischemia.  Patient declined  any invasive procedure for further investigation.  Cardiology recommended to continue medical treatment with statin, metoprolol, isosorbide mononitrate and Plavix, however Plavix is on hold in view of procedure.  Echocardiogram shows diffuse hypokinesis, EF of 40 to 50% and grade 1 diastolic dysfunction.  Continue to monitor.  Abnormal chest CT CT image showed groundglass opacities in the right lung, pulmonary was consulted and felt to be secondary to aspiration pneumonitis.  Recommend outpatient follow-up and repeat CT scan in 2 to 3 months.  Essential hypertension BP stable, continue current regimen  CKD stage III Creatinine currently at baseline, however patient not drinking much and urine output seems to be concentrated.  Will resume gentle hydration.  Continue to monitor renal function  Chronic diastolic and systolic heart failure Patient seems to be euvolemic at this time.  Continue current regimen  Stage II sacral ulcer-POA Continue local wound care  Periumbilical adenopathy. Mild elevated CA 19 9 and elevated Ca1 25. Patient and family decided not to further investigate.  Severe malnutrition Due to dysphagia, continue protein supplementation and encourage p.o. intake.  Expect to improve if dysphagia improved.  DVT prophylaxis: SCDs Code Status: Full code Family Communication: Discussed with son at bedside Disposition Plan: Home with home health after EGD  Consultants:   GI, cardiology, pulmonology  Procedures:   None  Antimicrobials:  None   Objective: Vitals:   11/14/17 1335 11/14/17 2102 11/15/17 0453 11/15/17 1336  BP: (!) 151/69 (!) 144/92 135/71 (!) 144/66  Pulse: 75 91 79 87  Resp: 15 19  16   Temp: (!) 97.3 F (36.3 C) 98.9 F (37.2 C) 99.1 F (  37.3 C) 98.8 F (37.1 C)  TempSrc: Oral   Oral  SpO2: 100% 99% 97% 98%  Weight:   56.2 kg   Height:        Intake/Output Summary (Last 24 hours) at 11/15/2017 1342 Last data filed at 11/14/2017  1516 Gross per 24 hour  Intake 515.87 ml  Output 300 ml  Net 215.87 ml   Filed Weights   11/14/17 0615 11/14/17 1235 11/15/17 0453  Weight: 53.3 kg 53.3 kg 56.2 kg    Examination:  General exam: Appears calm and comfortable, frail HEENT: OP moist and clear Respiratory system: Clear to auscultation. No wheezes,crackle or rhonchi Cardiovascular system: S1 & S2 heard, RRR. No JVD, murmurs, rubs or gallops Gastrointestinal system: Abdomen is nondistended, soft and nontender. No organomegaly or masses felt. Central nervous system: Left side hemiparesis, alert and oriented Extremities: No pedal edema. Skin: No rashes, lesions or ulcers Psychiatry: Mood & affect appropriate.    Data Reviewed: I have personally reviewed following labs and imaging studies  CBC: Recent Labs  Lab 11/10/17 0605 11/10/17 1517 11/12/17 0541 11/13/17 0608 11/15/17 0607  WBC 8.5 8.2 7.8 7.9 8.8  NEUTROABS  --   --  6.1 6.0 7.0  HGB 7.8* 8.2* 7.8* 8.3* 7.7*  HCT 25.3* 26.6* 25.0* 26.5* 25.7*  MCV 87.2 86.6 86.8 88.0 89.2  PLT 387 410* 392 419* 751*   Basic Metabolic Panel: Recent Labs  Lab 11/09/17 0600 11/10/17 0605 11/12/17 0541 11/13/17 0717 11/15/17 0607  NA 134* 138 134* 135 136  K 4.1 3.8 3.5 4.2 3.9  CL 103 102 95* 97* 102  CO2 22 25 27 28 24   GLUCOSE 148* 112* 154* 122* 136*  BUN 9 10 15 15 16   CREATININE 1.22* 1.40* 1.48* 1.38* 1.38*  CALCIUM 9.2 9.8 9.3 9.7 9.5  MG 1.5* 1.9  --   --   --    GFR: Estimated Creatinine Clearance: 23.3 mL/min (A) (by C-G formula based on SCr of 1.38 mg/dL (H)). Liver Function Tests: No results for input(s): AST, ALT, ALKPHOS, BILITOT, PROT, ALBUMIN in the last 168 hours. No results for input(s): LIPASE, AMYLASE in the last 168 hours. No results for input(s): AMMONIA in the last 168 hours. Coagulation Profile: Recent Labs  Lab 11/08/17 1643  INR 1.02   Cardiac Enzymes: Recent Labs  Lab 11/09/17 0023 11/09/17 0600 11/09/17 1131  11/10/17 1517  TROPONINI 0.27* 0.50* 0.71* 0.34*   BNP (last 3 results) No results for input(s): PROBNP in the last 8760 hours. HbA1C: No results for input(s): HGBA1C in the last 72 hours. CBG: Recent Labs  Lab 11/14/17 2057 11/14/17 2348 11/15/17 0451 11/15/17 0728 11/15/17 1109  GLUCAP 129* 96 123* 111* 143*   Lipid Profile: No results for input(s): CHOL, HDL, LDLCALC, TRIG, CHOLHDL, LDLDIRECT in the last 72 hours. Thyroid Function Tests: No results for input(s): TSH, T4TOTAL, FREET4, T3FREE, THYROIDAB in the last 72 hours. Anemia Panel: No results for input(s): VITAMINB12, FOLATE, FERRITIN, TIBC, IRON, RETICCTPCT in the last 72 hours. Sepsis Labs: No results for input(s): PROCALCITON, LATICACIDVEN in the last 168 hours.  No results found for this or any previous visit (from the past 240 hour(s)).    Radiology Studies: No results found.    Scheduled Meds: . amLODipine  5 mg Oral Daily  . atorvastatin  40 mg Oral Daily  . cloNIDine  0.1 mg Oral BID  . feeding supplement (ENSURE ENLIVE)  237 mL Oral BID BM  . insulin aspart  0-9 Units Subcutaneous Q4H  . isosorbide mononitrate  30 mg Oral Daily  . metoprolol tartrate  50 mg Oral BID  . pantoprazole  40 mg Oral BID  . polyethylene glycol  17 g Oral Daily  . senna-docusate  1 tablet Oral BID   Continuous Infusions: . [START ON 11/16/2017] ferumoxytol       LOS: 8 days    Time spent: Total of 25 minutes spent with pt, greater than 50% of which was spent in discussion of  treatment, counseling and coordination of care    Chipper Oman, MD Pager: Text Page via [email or web address]   If 7PM-7AM, please contact night-coverage [email or web address] 11/15/2017, 1:42 PM   Note - This record has been created using [employer]. Chart creation errors have been sought, but may not always have been located. Such creation errors do not reflect on the standard of medical care.

## 2017-11-15 NOTE — Progress Notes (Signed)
Occupational Therapy Treatment Patient Details Name: Azzure Garabedian MRN: [id number] DOB: 16-Jul-1927 Today's Date: 11/15/2017    History of present illness Kearie Mennen is a 84 y.o. female with medical history significant of DM 2, CVA left-sided weakness, history of CAD,  hypertension, hyperlipidemia, CKD 3, combined systolic diastolic CHF; admitted with dehydration, wt loss, dysphagia, EGD 11/14/17 scheduled   OT comments  Nausea limiting pt this day.  Pt very weak this day. RN notified  Follow Up Recommendations  Home health OT;Supervision/Assistance - 24 hour;SNF    Equipment Recommendations  None recommended by OT    Recommendations for Other Services      Precautions / Restrictions         Mobility Bed Mobility Overal bed mobility: Needs Assistance Bed Mobility: Supine to Sit;Sit to Supine     Supine to sit: Max assist;Mod assist Sit to supine: Max assist;Mod assist      Transfers                      Balance Overall balance assessment: Needs assistance Sitting-balance support: Bilateral upper extremity supported;Feet supported Sitting balance-Leahy Scale: Fair                                     ADL either performed or assessed with clinical judgement   ADL Overall ADL's : Needs assistance/impaired                                       General ADL Comments: pt agreed to sit EOB- but became nauseous sitting EOB and had to return to laying down. Encouraged pt to lie on left side . RN called .     Vision Patient Visual Report: No change from baseline            Cognition Arousal/Alertness: Awake/alert Behavior During Therapy: WFL for tasks assessed/performed Overall Cognitive Status: Within Functional Limits for tasks assessed                                 General Comments: pt with increased fatigue this day                Pertinent Vitals/ Pain       Pain Assessment: No/denies pain      Prior Functioning/Environment              Frequency  Min 2X/week        Progress Toward Goals  OT Goals(current goals can now be found in the care plan section)  Progress towards OT goals: Not progressing toward goals - comment(nausea limting pt this OT session)     Plan Discharge plan remains appropriate;Discharge plan needs to be updated    Co-evaluation                 AM-PAC PT "6 Clicks" Daily Activity     Outcome Measure   Help from another person eating meals?: A Little Help from another person taking care of personal grooming?: A Little Help from another person toileting, which includes using toliet, bedpan, or urinal?: A Lot Help from another person bathing (including washing, rinsing, drying)?: Total Help from another person to put on and taking off regular upper body clothing?: A Little Help from  another person to put on and taking off regular lower body clothing?: Total 6 Click Score: 13    End of Session    OT Visit Diagnosis: Unsteadiness on feet (R26.81);Muscle weakness (generalized) (M62.81)   Activity Tolerance Patient limited by fatigue   Patient Left with call bell/phone within reach;in bed;with bed alarm set;with nursing/sitter in room   Nurse Communication Mobility status        Time: 1428-1440 OT Time Calculation (min): 12 min  Charges: OT General Charges $OT Visit: 1 Visit OT Treatments $Self Care/Home Management : 8-22 mins  Kari Baars, [place] [phone] Office- [phone]      Dimonique Bourdeau, [name] 11/15/2017, 7:03 PM

## 2017-11-16 LAB — CBC WITH DIFFERENTIAL/PLATELET
Abs Immature Granulocytes: 0.03 10*3/uL (ref 0.00–0.07)
BASOS ABS: 0.1 10*3/uL (ref 0.0–0.1)
Basophils Relative: 1 %
EOS ABS: 0.2 10*3/uL (ref 0.0–0.5)
EOS PCT: 2 %
HCT: 25.5 % — ABNORMAL LOW (ref 36.0–46.0)
HEMOGLOBIN: 7.7 g/dL — AB (ref 12.0–15.0)
IMMATURE GRANULOCYTES: 0 %
LYMPHS ABS: 0.9 10*3/uL (ref 0.7–4.0)
LYMPHS PCT: 11 %
MCH: 26.8 pg (ref 26.0–34.0)
MCHC: 30.2 g/dL (ref 30.0–36.0)
MCV: 88.9 fL (ref 80.0–100.0)
Monocytes Absolute: 0.9 10*3/uL (ref 0.1–1.0)
Monocytes Relative: 10 %
NEUTROS PCT: 76 %
Neutro Abs: 6.2 10*3/uL (ref 1.7–7.7)
Platelets: 424 10*3/uL — ABNORMAL HIGH (ref 150–400)
RBC: 2.87 MIL/uL — AB (ref 3.87–5.11)
RDW: 15.9 % — AB (ref 11.5–15.5)
WBC: 8.2 10*3/uL (ref 4.0–10.5)
nRBC: 0 % (ref 0.0–0.2)

## 2017-11-16 LAB — RENAL FUNCTION PANEL
ANION GAP: 11 (ref 5–15)
Albumin: 2.1 g/dL — ABNORMAL LOW (ref 3.5–5.0)
BUN: 15 mg/dL (ref 8–23)
CHLORIDE: 100 mmol/L (ref 98–111)
CO2: 24 mmol/L (ref 22–32)
Calcium: 9.4 mg/dL (ref 8.9–10.3)
Creatinine, Ser: 1.37 mg/dL — ABNORMAL HIGH (ref 0.44–1.00)
GFR, EST AFRICAN AMERICAN: 39 mL/min — AB (ref >60)
GFR, EST NON AFRICAN AMERICAN: 33 mL/min — AB (ref >60)
Glucose, Bld: 113 mg/dL — ABNORMAL HIGH (ref 70–99)
POTASSIUM: 3.9 mmol/L (ref 3.5–5.1)
Phosphorus: 2.4 mg/dL — ABNORMAL LOW (ref 2.5–4.6)
Sodium: 135 mmol/L (ref 135–145)

## 2017-11-16 LAB — GLUCOSE, CAPILLARY
GLUCOSE-CAPILLARY: 136 mg/dL — AB (ref 70–99)
Glucose-Capillary: 91 mg/dL (ref 70–99)
Glucose-Capillary: 114 mg/dL — ABNORMAL HIGH (ref 70–99)
Glucose-Capillary: 156 mg/dL — ABNORMAL HIGH (ref 70–99)
Glucose-Capillary: 138 mg/dL — ABNORMAL HIGH (ref 70–99)

## 2017-11-16 NOTE — Progress Notes (Signed)
Occupational Therapy Treatment Patient Details Name: [name] MRN: [id number] DOB: 03-10-1927 Today's Date: 11/16/2017    History of present illness [name] is a 84 y.o. female with medical history significant of DM 2, CVA left-sided weakness, history of CAD,  hypertension, hyperlipidemia, CKD 3, combined systolic diastolic CHF; admitted with dehydration, wt loss, dysphagia, EGD 11/14/17 scheduled   OT comments  Pt with increased participation this day. Pt will need significant A at home with ADL activity   Follow Up Recommendations  Home health OT;Supervision/Assistance - 24 hour;SNF    Equipment Recommendations  None recommended by OT    Recommendations for Other Services      Precautions / Restrictions Precautions Precautions: Fall       Mobility Bed Mobility Overal bed mobility: Needs Assistance Bed Mobility: Supine to Sit;Sit to Supine     Supine to sit: Mod assist Sit to supine: Max assist;Mod assist      Transfers Overall transfer level: Needs assistance Equipment used: 2 person hand held assist Transfers: Sit to/from [facility] Sit to Stand: Max assist;+2 physical assistance;+2 safety/equipment Stand pivot transfers: Max assist;+2 physical assistance;+2 safety/equipment            Balance Overall balance assessment: Needs assistance Sitting-balance support: Bilateral upper extremity supported;Feet supported Sitting balance-Leahy Scale: Fair       Standing balance-Leahy Scale: Poor                             ADL either performed or assessed with clinical judgement   ADL Overall ADL's : Needs assistance/impaired                         Toilet Transfer: Stand-pivot;Cueing for sequencing;Cueing for safety;Maximal assistance;+2 for safety/equipment   Toileting- Clothing Manipulation and Hygiene: +2 for safety/equipment;Total assistance;Maximal assistance;Sit to/from stand;Cueing for safety          General ADL Comments: Pt agreed to get to chair but had BM on way to chair. CNA and OT cleaned pt and Aed her to chair.       Vision Patient Visual Report: No change from baseline            Cognition Arousal/Alertness: Awake/alert Behavior During Therapy: WFL for tasks assessed/performed Overall Cognitive Status: Within Functional Limits for tasks assessed                                                     Pertinent Vitals/ Pain       Pain Assessment: No/denies pain         Frequency  Min 2X/week        Progress Toward Goals  OT Goals(current goals can now be found in the care plan section)  Progress towards OT goals: Progressing toward goals     Plan Discharge plan remains appropriate       AM-PAC PT "6 Clicks" Daily Activity     Outcome Measure   Help from another person eating meals?: A Little Help from another person taking care of personal grooming?: A Little Help from another person toileting, which includes using toliet, bedpan, or urinal?: A Lot Help from another person bathing (including washing, rinsing, drying)?: Total Help from another person to put on and taking off regular upper  body clothing?: A Little Help from another person to put on and taking off regular lower body clothing?: Total 6 Click Score: 13    End of Session Equipment Utilized During Treatment: Gait belt;Rolling walker  OT Visit Diagnosis: Unsteadiness on feet (R26.81);Muscle weakness (generalized) (M62.81)   Activity Tolerance Patient limited by fatigue   Patient Left with call bell/phone within reach;with nursing/sitter in room;in chair   Nurse Communication Mobility status        Time: 1410-1430 OT Time Calculation (min): 20 min  Charges: OT General Charges $OT Visit: 1 Visit OT Treatments $Self Care/Home Management : 8-22 mins  Kari Baars, Jermyn [phone] Office- [phone]      Maille Halliwell,  [name] 11/16/2017, 3:58 PM

## 2017-11-16 NOTE — Care Management Important Message (Signed)
Important Message  Patient Details  Name: [name] MRN: [id number] Date of Birth: 1927-07-01   Medicare Important Message Given:  Yes    Kerin Salen 11/16/2017, 11:46 AMImportant Message  Patient Details  Name: [name] MRN: [id number] Date of Birth: 24-Feb-1927   Medicare Important Message Given:  Yes    Kerin Salen 11/16/2017, 11:46 AM

## 2017-11-16 NOTE — Progress Notes (Signed)
PROGRESS NOTE [facility]   Emony Dormer   IYM:[id number] DOB: [birth year]  DOA: 11/04/2017 PCP: Deland Pretty, MD   Brief Narrative:  [name] is a 84 year old female with medical history of diabetes mellitus type 2, CVA with left side residual weakness, CAD, hypertension, CKD, combined systolic and diastolic CHF who presented to the emergency department complaining of nausea and vomiting for 1 week prior to admission, associated with unintentional weight loss.  She was admitted with working diagnosis of dehydration due to dysphagia.   Subjective: Patient seen and examined, she has no complaints today. Unable to tolerate diet yesterday due to emesis. "Everything I eat just come back up". Denies chest pain, SOB and dizziness   Assessment & Plan: Dysphagia CT abdomen/pelvis body esophagram is relative really unremarkable except large hiatal hernia. 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  Patient on PPI.  Tolerating full liquid diet but feels full quickly. EGD diagnostic and therapeutic was schedule however patient was on Plavix and procedure was canceled.  Patient need to be off Plavix for 5 days therefore EGD scheduled for 10/21.  Unable to arrange this as an outpatient also patient son declining procedure to be done as an outpatient.  Continue to monitor, add gentle hydration to keep patient well hydrated.   Anemia of chronic disease Felt to be multifactorial due to malnutrition and CKD. Due to poor oral intake, patient receiving IV iron.  Hemoglobin slight decrease from admission.  No signs of overt bleeding. Hgb today stable, continue to hold lovenox.  Monitor CBC and transfuse if hemoglobin less than 7.  CAD, elevated troponin and sinus tachycardia. - stable  Patient had an episode of chest pain, troponin was found to be elevated, cardiology was consulted and felt to be demand ischemia.  Patient  declined any invasive procedure for further investigation.  Cardiology recommended to continue medical treatment with statin, metoprolol, isosorbide mononitrate and Plavix, however Plavix is on hold in view of procedure.  Echocardiogram shows diffuse hypokinesis, EF of 40 to 50% and grade 1 diastolic dysfunction.  Continue to monitor.  Abnormal chest CT CT image showed groundglass opacities in the right lung, pulmonary was consulted and felt to be secondary to aspiration pneumonitis.  Recommend outpatient follow-up and repeat CT scan in 2 to 3 months.  Essential hypertension BP remains stable, continue amlodipine Lopressor and Imdur.  CKD stage III - stable Creatinine remains at baseline, and receiving gentle hydration due to poor oral intake.  Continue to monitor renal function closely.  Chronic diastolic and systolic heart failure Remains euvolemic, monitor for signs of fluid overload as patient is on IV fluids.  Continue current medications.  Stage II sacral ulcer-POA Continue local wound care  Periumbilical adenopathy. Mild elevated CA 19 9 and elevated Ca1 25. Patient and family decided not to further investigate.  Severe malnutrition Due to dysphagia, continue protein supplementation and encourage p.o. intake.  Expect to improve if dysphagia improved.  DVT prophylaxis: SCDs Code Status: Full code Family Communication: Discussed with son at bedside Disposition Plan: Home with home health after EGD  Consultants:   GI, cardiology, pulmonology  Procedures:   None  Antimicrobials:  None   Objective: Vitals:   11/15/17 0453 11/15/17 1336 11/15/17 2122 11/16/17 0533  BP: 135/71 (!) 144/66 (!) 141/64 (!) 144/60  Pulse: 79 87 89 86  Resp:  16  16  Temp: 99.1 F (37.3 C) 98.8 F (37.1 C) 99.7 F (  37.6 C) 98.3 F (36.8 C)  TempSrc:  Oral Oral Oral  SpO2: 97% 98% 100% 100%  Weight: 56.2 kg     Height:        Intake/Output Summary (Last 24 hours) at 11/16/2017  1216 Last data filed at 11/16/2017 1208 Gross per 24 hour  Intake 1207.23 ml  Output 650 ml  Net 557.23 ml   Filed Weights   11/14/17 0615 11/14/17 1235 11/15/17 0453  Weight: 53.3 kg 53.3 kg 56.2 kg    Examination:  General: Pt is alert, awake, not in acute distress Cardiovascular: RRR, S1/S2 +, no rubs, no gallops Respiratory: CTA bilaterally, no wheezing, no rhonchi Abdominal: Soft, NT, ND, bowel sounds + Extremities: no edema  Data Reviewed: I have personally reviewed following labs and imaging studies  CBC: Recent Labs  Lab 11/10/17 1517 11/12/17 0541 11/13/17 0608 11/15/17 0607 11/16/17 0606  WBC 8.2 7.8 7.9 8.8 8.2  NEUTROABS  --  6.1 6.0 7.0 6.2  HGB 8.2* 7.8* 8.3* 7.7* 7.7*  HCT 26.6* 25.0* 26.5* 25.7* 25.5*  MCV 86.6 86.8 88.0 89.2 88.9  PLT 410* 392 419* 415* 650*   Basic Metabolic Panel: Recent Labs  Lab 11/10/17 0605 11/12/17 0541 11/13/17 0717 11/15/17 0607 11/16/17 0606  NA 138 134* 135 136 135  K 3.8 3.5 4.2 3.9 3.9  CL 102 95* 97* 102 100  CO2 25 27 28 24 24   GLUCOSE 112* 154* 122* 136* 113*  BUN 10 15 15 16 15   CREATININE 1.40* 1.48* 1.38* 1.38* 1.37*  CALCIUM 9.8 9.3 9.7 9.5 9.4  MG 1.9  --   --   --   --   PHOS  --   --   --   --  2.4*   GFR: Estimated Creatinine Clearance: 23.5 mL/min (A) (by C-G formula based on SCr of 1.37 mg/dL (H)). Liver Function Tests: Recent Labs  Lab 11/16/17 0606  ALBUMIN 2.1*   No results for input(s): LIPASE, AMYLASE in the last 168 hours. No results for input(s): AMMONIA in the last 168 hours. Coagulation Profile: No results for input(s): INR, PROTIME in the last 168 hours. Cardiac Enzymes: Recent Labs  Lab 11/10/17 1517  TROPONINI 0.34*   BNP (last 3 results) No results for input(s): PROBNP in the last 8760 hours. HbA1C: No results for input(s): HGBA1C in the last 72 hours. CBG: Recent Labs  Lab 11/15/17 1944 11/15/17 2350 11/16/17 0413 11/16/17 0745 11/16/17 1133  GLUCAP 142* 92  91 114* 156*   Lipid Profile: No results for input(s): CHOL, HDL, LDLCALC, TRIG, CHOLHDL, LDLDIRECT in the last 72 hours. Thyroid Function Tests: No results for input(s): TSH, T4TOTAL, FREET4, T3FREE, THYROIDAB in the last 72 hours. Anemia Panel: No results for input(s): VITAMINB12, FOLATE, FERRITIN, TIBC, IRON, RETICCTPCT in the last 72 hours. Sepsis Labs: No results for input(s): PROCALCITON, LATICACIDVEN in the last 168 hours.  No results found for this or any previous visit (from the past 240 hour(s)).    Radiology Studies: No results found.    Scheduled Meds: . amLODipine  5 mg Oral Daily  . atorvastatin  40 mg Oral Daily  . cloNIDine  0.1 mg Oral BID  . feeding supplement (ENSURE ENLIVE)  237 mL Oral BID BM  . insulin aspart  0-9 Units Subcutaneous Q4H  . isosorbide mononitrate  30 mg Oral Daily  . metoprolol tartrate  50 mg Oral BID  . pantoprazole  40 mg Oral BID  . polyethylene glycol  17 g  Oral Daily  . senna-docusate  1 tablet Oral BID   Continuous Infusions: . sodium chloride 50 mL/hr at 11/16/17 1009  . ferumoxytol 510 mg (11/16/17 1010)     LOS: 9 days    Time spent: Total of 25 minutes spent with pt, greater than 50% of which was spent in discussion of  treatment, counseling and coordination of care   Chipper Oman, MD Pager: Text Page via [email or web address]   If 7PM-7AM, please contact night-coverage [email or web address] 11/16/2017, 12:16 PM   Note - This record has been created using [employer]. Chart creation errors have been sought, but may not always have been located. Such creation errors do not reflect on the standard of medical care.

## 2017-11-17 LAB — CBC WITH DIFFERENTIAL/PLATELET
ABS IMMATURE GRANULOCYTES: 0.03 10*3/uL (ref 0.00–0.07)
BASOS ABS: 0 10*3/uL (ref 0.0–0.1)
Basophils Relative: 1 %
Eosinophils Absolute: 0.1 10*3/uL (ref 0.0–0.5)
Eosinophils Relative: 1 %
HCT: 25.7 % — ABNORMAL LOW (ref 36.0–46.0)
Hemoglobin: 7.7 g/dL — ABNORMAL LOW (ref 12.0–15.0)
IMMATURE GRANULOCYTES: 0 %
LYMPHS ABS: 1 10*3/uL (ref 0.7–4.0)
Lymphocytes Relative: 11 %
MCH: 26.7 pg (ref 26.0–34.0)
MCHC: 30 g/dL (ref 30.0–36.0)
MCV: 89.2 fL (ref 80.0–100.0)
MONOS PCT: 11 %
Monocytes Absolute: 0.9 10*3/uL (ref 0.1–1.0)
NEUTROS ABS: 6.8 10*3/uL (ref 1.7–7.7)
NEUTROS PCT: 76 %
PLATELETS: 431 10*3/uL — AB (ref 150–400)
RBC: 2.88 MIL/uL — ABNORMAL LOW (ref 3.87–5.11)
RDW: 15.9 % — AB (ref 11.5–15.5)
WBC: 8.8 10*3/uL (ref 4.0–10.5)
nRBC: 0 % (ref 0.0–0.2)

## 2017-11-17 LAB — GLUCOSE, CAPILLARY
Glucose-Capillary: 104 mg/dL — ABNORMAL HIGH (ref 70–99)
Glucose-Capillary: 115 mg/dL — ABNORMAL HIGH (ref 70–99)
Glucose-Capillary: 116 mg/dL — ABNORMAL HIGH (ref 70–99)
Glucose-Capillary: 126 mg/dL — ABNORMAL HIGH (ref 70–99)
Glucose-Capillary: 174 mg/dL — ABNORMAL HIGH (ref 70–99)
Glucose-Capillary: 95 mg/dL (ref 70–99)

## 2017-11-17 LAB — BASIC METABOLIC PANEL WITH GFR
Anion gap: 10 (ref 5–15)
BUN: 14 mg/dL (ref 8–23)
CO2: 24 mmol/L (ref 22–32)
Calcium: 9.5 mg/dL (ref 8.9–10.3)
Chloride: 101 mmol/L (ref 98–111)
Creatinine, Ser: 1.34 mg/dL — ABNORMAL HIGH (ref 0.44–1.00)
GFR calc Af Amer: 40 mL/min — ABNORMAL LOW
GFR calc non Af Amer: 34 mL/min — ABNORMAL LOW
Glucose, Bld: 127 mg/dL — ABNORMAL HIGH (ref 70–99)
Potassium: 4.4 mmol/L (ref 3.5–5.1)
Sodium: 135 mmol/L (ref 135–145)

## 2017-11-17 LAB — MAGNESIUM: Magnesium: 1.7 mg/dL (ref 1.7–2.4)

## 2017-11-17 NOTE — Progress Notes (Signed)
TRH   Patient continues to be stable, having some nausea and vomiting. Labs reviewed and stable. Awaiting for EGD on Monday. Continue hold plavix. FOBT pending. Continue current regimen.   Chipper Oman, MD

## 2017-11-17 NOTE — Progress Notes (Signed)
Physical Therapy Treatment Patient Details Name: [name] MRN: [id number] DOB: 1927-05-06 Today's Date: 11/17/2017    History of Present Illness [name] is a 84 y.o. female with medical history significant of DM 2, CVA left-sided weakness, history of CAD,  hypertension, hyperlipidemia, CKD 3, combined systolic diastolic CHF; admitted with dehydration, wt loss, dysphagia, EGD 11/14/17 scheduled    PT Comments    Assisted OOB to amb a limited distance with much assist.  transfer comment: very limited self ability to transfer due to weakness and poor balance.  Also noted poor posture flex hips and knees.  very unsteady gait and poor balance with limited activity tolerance.  HIGH FALL RISK.      Follow Up Recommendations  SNF     Equipment Recommendations  None recommended by PT    Recommendations for Other Services       Precautions / Restrictions Precautions Precautions: Fall Precaution Comments: CVA L hemi Restrictions Weight Bearing Restrictions: No    Mobility  Bed Mobility Overal bed mobility: Needs Assistance Bed Mobility: Supine to Sit     Supine to sit: Mod assist     General bed mobility comments: increased time and much assist to transfer to EOB   Transfers Overall transfer level: Needs assistance Equipment used: Rolling walker (2 wheeled) Transfers: Sit to/from [facility] Sit to Stand: Max assist Stand pivot transfers: Max assist;Total assist       General transfer comment: very limited self ability to transfer due to weakness and poor balance.  Also noted poor posture flex hips and knees.    Ambulation/Gait Ambulation/Gait assistance: Max assist Gait Distance (Feet): 18 Feet Assistive device: Rolling walker (2 wheeled) Gait Pattern/deviations: Step-to pattern Gait velocity: decreased   General Gait Details: very unsteady gait and poor balance with limited activity tolerance.  HIGH FALL RISK.     Stairs              Wheelchair Mobility    Modified Rankin (Stroke Patients Only)       Balance                                            Cognition Arousal/Alertness: Awake/alert Behavior During Therapy: WFL for tasks assessed/performed Overall Cognitive Status: Difficult to assess Area of Impairment: Safety/judgement;Problem solving;Awareness                       Following Commands: Follows one step commands consistently       General Comments: slow, delayed, impaired safety cognition      Exercises      General Comments        Pertinent Vitals/Pain Pain Assessment: No/denies pain    Home Living                      Prior Function            PT Goals (current goals can now be found in the care plan section) Progress towards PT goals: Progressing toward goals    Frequency    Min 2X/week      PT Plan Current plan remains appropriate;Frequency needs to be updated    Co-evaluation              AM-PAC PT "6 Clicks" Daily Activity  Outcome Measure  Difficulty turning over in bed (including adjusting  bedclothes, sheets and blankets)?: A Lot Difficulty moving from lying on back to sitting on the side of the bed? : A Lot Difficulty sitting down on and standing up from a chair with arms (e.g., wheelchair, bedside commode, etc,.)?: A Lot Help needed moving to and from a bed to chair (including a wheelchair)?: A Lot Help needed walking in hospital room?: A Lot Help needed climbing 3-5 steps with a railing? : A Lot 6 Click Score: 12    End of Session Equipment Utilized During Treatment: Gait belt Activity Tolerance: Patient limited by fatigue Patient left: in chair;with call bell/phone within reach Nurse Communication: Mobility status PT Visit Diagnosis: Muscle weakness (generalized) (M62.81)     Time: 1020-1040 PT Time Calculation (min) (ACUTE ONLY): 20 min  Charges:  $Gait Training: 8-22 mins                      Rica Koyanagi  PTA Acute  Rehabilitation Services Pager      [phone] Office      [phone]

## 2017-11-17 NOTE — Telephone Encounter (Signed)
Patient is on procedure schedule for 11/20/17. Will call after that date to follow up.

## 2017-11-17 NOTE — Progress Notes (Signed)
Progress Note   Subjective  Chief Complaint: Dysphagia, early satiety, vomiting  Patient reports that she was eating her grits and they just "came back up with all my other food" early this morning, explains that she does not believe that actually got down to her stomach.  Tells me that this has continued over the past few days and is still intermittent.  Nothing has changed.  Patient is aware of plans for procedure on Monday.   Objective   Vital signs in last 24 hours: Temp:  [98.2 F (36.8 C)-98.3 F (36.8 C)] 98.3 F (36.8 C) (10/18 0427) Pulse Rate:  [80-87] 83 (10/18 0427) Resp:  [18-19] 18 (10/18 0427) BP: (118-138)/(58-60) 138/60 (10/18 0427) SpO2:  [98 %-100 %] 100 % (10/18 0427) Weight:  [46.6 kg-55.9 kg] 46.6 kg (10/18 0427) Last BM Date: 11/16/17 General:    Elderly AA female in NAD Heart:  Regular rate and rhythm; no murmurs Lungs: Respirations even and unlabored, lungs CTA bilaterally Abdomen:  Soft, nontender and nondistended. Normal bowel sounds. Extremities:  Without edema. Neurologic:  Alert and oriented,  grossly normal neurologically. Psych:  Cooperative. Normal mood and affect.  Intake/Output from previous day: 10/17 0701 - 10/18 0700 In: 300 [P.O.:300] Out: 450 [Urine:450]  Lab Results: Recent Labs    11/15/17 0607 11/16/17 0606 11/17/17 0545  WBC 8.8 8.2 8.8  HGB 7.7* 7.7* 7.7*  HCT 25.7* 25.5* 25.7*  PLT 415* 424* 431*   BMET Recent Labs    11/15/17 0607 11/16/17 0606 11/17/17 0545  NA 136 135 135  K 3.9 3.9 4.4  CL 102 100 101  CO2 24 24 24   GLUCOSE 136* 113* 127*  BUN 16 15 14   CREATININE 1.38* 1.37* 1.34*  CALCIUM 9.5 9.4 9.5   LFT Recent Labs    11/16/17 0606  ALBUMIN 2.1*     Assessment / Plan:   Assessment: 1.  Unexplained dysphagia: 1 month of early satiety and daily nausea with some episodes of vomiting, normocytic anemia without overt GI blood loss and unintentional weight loss and obviously explained by  noncontrast contrast CT abdomen pelvis, initial barium esophagram showed a large hiatal hernia and poor esophageal motility, upper GI series suggests possibility of distal esophageal spasm versus stricture, gastric emptying study showed minimally delayed gastric emptying at 4 hours; patient was for EGD earlier this week but was still on Plavix, this has been stopped and patient will be ready for procedure on Monday 2.  Abnormal CT of the abdomen pelvis: Again with a lymph node in the fat-containing umbilical hernia concerning for Sister Wynona Dove node, abnormal right lung lobe, family declined IR directed biopsy of umbilical lymph node, CH-88-5 is minimally elevated Ca 125 is elevated  Plan: 1.  Patient to remain on full liquid diet, plans are for EGD on Monday, 11/20/2017 after Plavix washout. 2.  Patient's Lovenox will need to be held the night of 11/19/2017 and she will need to be n.p.o. after midnight 3.  We will also need to ensure the patient's hemoglobin remains above 7 for the time of procedure 4.  We will briefly check back in with patient/chart on Sunday 10/20 to ensure that she is still appropriate for procedure on Monday. 5.  Please await any further recommendations from Dr. Hilarie Fredrickson later today  Thank you for your kind consultation, we will continue to follow along.  If you need anything over the weekend please give [place] a call.   LOS: 10 days  Lavone Nian Gizelle Whetsel  11/17/2017, 11:23 AM

## 2017-11-17 NOTE — H&P (View-Only) (Signed)
Progress Note   Subjective  Chief Complaint: Dysphagia, early satiety, vomiting  Patient reports that she was eating her grits and they just "came back up with all my other food" early this morning, explains that she does not believe that actually got down to her stomach.  Tells me that this has continued over the past few days and is still intermittent.  Nothing has changed.  Patient is aware of plans for procedure on Monday.   Objective   Vital signs in last 24 hours: Temp:  [98.2 F (36.8 C)-98.3 F (36.8 C)] 98.3 F (36.8 C) (10/18 0427) Pulse Rate:  [80-87] 83 (10/18 0427) Resp:  [18-19] 18 (10/18 0427) BP: (118-138)/(58-60) 138/60 (10/18 0427) SpO2:  [98 %-100 %] 100 % (10/18 0427) Weight:  [46.6 kg-55.9 kg] 46.6 kg (10/18 0427) Last BM Date: 11/16/17 General:    Elderly AA female in NAD Heart:  Regular rate and rhythm; no murmurs Lungs: Respirations even and unlabored, lungs CTA bilaterally Abdomen:  Soft, nontender and nondistended. Normal bowel sounds. Extremities:  Without edema. Neurologic:  Alert and oriented,  grossly normal neurologically. Psych:  Cooperative. Normal mood and affect.  Intake/Output from previous day: 10/17 0701 - 10/18 0700 In: 300 [P.O.:300] Out: 450 [Urine:450]  Lab Results: Recent Labs    11/15/17 0607 11/16/17 0606 11/17/17 0545  WBC 8.8 8.2 8.8  HGB 7.7* 7.7* 7.7*  HCT 25.7* 25.5* 25.7*  PLT 415* 424* 431*   BMET Recent Labs    11/15/17 0607 11/16/17 0606 11/17/17 0545  NA 136 135 135  K 3.9 3.9 4.4  CL 102 100 101  CO2 24 24 24   GLUCOSE 136* 113* 127*  BUN 16 15 14   CREATININE 1.38* 1.37* 1.34*  CALCIUM 9.5 9.4 9.5   LFT Recent Labs    11/16/17 0606  ALBUMIN 2.1*     Assessment / Plan:   Assessment: 1.  Unexplained dysphagia: 1 month of early satiety and daily nausea with some episodes of vomiting, normocytic anemia without overt GI blood loss and unintentional weight loss and obviously explained by  noncontrast contrast CT abdomen pelvis, initial barium esophagram showed a large hiatal hernia and poor esophageal motility, upper GI series suggests possibility of distal esophageal spasm versus stricture, gastric emptying study showed minimally delayed gastric emptying at 4 hours; patient was for EGD earlier this week but was still on Plavix, this has been stopped and patient will be ready for procedure on Monday 2.  Abnormal CT of the abdomen pelvis: Again with a lymph node in the fat-containing umbilical hernia concerning for Sister Wynona Dove node, abnormal right lung lobe, family declined IR directed biopsy of umbilical lymph node, TW-[id number] is minimally elevated Ca 125 is elevated  Plan: 1.  Patient to remain on full liquid diet, plans are for EGD on Monday, 11/20/2017 after Plavix washout. 2.  Patient's Lovenox will need to be held the night of 11/19/2017 and she will need to be n.p.o. after midnight 3.  We will also need to ensure the patient's hemoglobin remains above 7 for the time of procedure 4.  We will briefly check back in with patient/chart on Sunday 10/20 to ensure that she is still appropriate for procedure on Monday. 5.  Please await any further recommendations from Dr. Hilarie Fredrickson later today  Thank you for your kind consultation, we will continue to follow along.  If you need anything over the weekend please give [place] a call.   LOS: 10 days  [name]  11/17/2017, 11:23 AM

## 2017-11-18 LAB — BASIC METABOLIC PANEL
ANION GAP: 11 (ref 5–15)
BUN: 12 mg/dL (ref 8–23)
CALCIUM: 9.4 mg/dL (ref 8.9–10.3)
CO2: 23 mmol/L (ref 22–32)
CREATININE: 1.25 mg/dL — AB (ref 0.44–1.00)
Chloride: 100 mmol/L (ref 98–111)
GFR calc non Af Amer: 37 mL/min — ABNORMAL LOW (ref >60)
GFR, EST AFRICAN AMERICAN: 43 mL/min — AB (ref >60)
Glucose, Bld: 117 mg/dL — ABNORMAL HIGH (ref 70–99)
Potassium: 3.4 mmol/L — ABNORMAL LOW (ref 3.5–5.1)
SODIUM: 134 mmol/L — AB (ref 135–145)

## 2017-11-18 LAB — CBC WITH DIFFERENTIAL/PLATELET
Abs Immature Granulocytes: 0.05 10*3/uL (ref 0.00–0.07)
BASOS ABS: 0.1 10*3/uL (ref 0.0–0.1)
BASOS PCT: 1 %
EOS ABS: 0.1 10*3/uL (ref 0.0–0.5)
EOS PCT: 1 %
HEMATOCRIT: 25.2 % — AB (ref 36.0–46.0)
Hemoglobin: 7.5 g/dL — ABNORMAL LOW (ref 12.0–15.0)
IMMATURE GRANULOCYTES: 1 %
Lymphocytes Relative: 12 %
Lymphs Abs: 1 10*3/uL (ref 0.7–4.0)
MCH: 26.5 pg (ref 26.0–34.0)
MCHC: 29.8 g/dL — AB (ref 30.0–36.0)
MCV: 89 fL (ref 80.0–100.0)
Monocytes Absolute: 0.9 10*3/uL (ref 0.1–1.0)
Monocytes Relative: 10 %
NEUTROS PCT: 75 %
NRBC: 0 % (ref 0.0–0.2)
Neutro Abs: 6.7 10*3/uL (ref 1.7–7.7)
PLATELETS: 433 10*3/uL — AB (ref 150–400)
RBC: 2.83 MIL/uL — AB (ref 3.87–5.11)
RDW: 16 % — AB (ref 11.5–15.5)
WBC: 8.8 10*3/uL (ref 4.0–10.5)

## 2017-11-18 LAB — GLUCOSE, CAPILLARY
GLUCOSE-CAPILLARY: 155 mg/dL — AB (ref 70–99)
GLUCOSE-CAPILLARY: 120 mg/dL — AB (ref 70–99)
Glucose-Capillary: 100 mg/dL — ABNORMAL HIGH (ref 70–99)
Glucose-Capillary: 108 mg/dL — ABNORMAL HIGH (ref 70–99)
Glucose-Capillary: 95 mg/dL (ref 70–99)

## 2017-11-18 MED ORDER — POTASSIUM CHLORIDE 20 MEQ PO PACK
40.0000 meq | PACK | Freq: Once | ORAL | Status: AC
  Administered 2017-11-18: 40 meq via ORAL
  Filled 2017-11-18: qty 2

## 2017-11-18 NOTE — Progress Notes (Signed)
PROGRESS NOTE [facility]   [name]   WJX:[id number] DOB: 1927-12-05  DOA: 11/04/2017 PCP: Deland Pretty, MD   Brief Narrative:  [name] is a 84 year old female with medical history of diabetes mellitus type 2, CVA with left side residual weakness, CAD, hypertension, CKD, combined systolic and diastolic CHF who presented to the emergency department complaining of nausea and vomiting for 1 week prior to admission, associated with unintentional weight loss.  She was admitted with working diagnosis of dehydration due to dysphagia.   Subjective: No new concerns this morning, still with difficulty on keeping food down, vomits frequently. Participating with PT, awaiting for EGD on Monday 10/21.  Assessment & Plan: Dysphagia CT abdomen/pelvis body esophagram is relative really unremarkable except large hiatal hernia. 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  Patient on PPI.  Tolerating full liquid diet but feels full quickly. EGD diagnostic and therapeutic was schedule however patient was on Plavix and procedure was canceled.  Patient need to be off Plavix for 5 days therefore EGD scheduled for 10/21.  Unable to arrange this as an outpatient also patient son declining procedure to be done as an outpatient. Continue gentle hydration to avoid dehydration due to poor PO intake   Anemia of chronic disease Felt to be multifactorial due to malnutrition and CKD. ? GI bleed in setting of Plavix  Due to poor oral intake, patient receiving IV iron.  Hemoglobin slight decrease from admission.  No signs of overt bleeding. Hgb slight down today. Hold blood thinners, Continue to monitor CBC and transfuse if hemoglobin less than 7.  CAD, elevated troponin and sinus tachycardia. - remains stable  Patient had an episode of chest pain, troponin was found to be elevated, cardiology was consulted and felt to be demand ischemia.   Patient declined any invasive procedure for further investigation.  Cardiology recommended to continue medical treatment with statin, metoprolol, isosorbide mononitrate and Plavix, however Plavix is on hold in view of procedure.  Echocardiogram shows diffuse hypokinesis, EF of 40 to 50% and grade 1 diastolic dysfunction.  Continue to monitor   Abnormal chest CT CT image showed groundglass opacities in the right lung, pulmonary was consulted and felt to be secondary to aspiration pneumonitis.  Recommend outpatient follow-up and repeat CT scan in 2 to 3 months.  Essential hypertension BP remains stable, continue amlodipine Lopressor and Imdur.  CKD stage III - stable Renal function remains at baseline, getting IVF due to poor oral intakje .  Continue to monitor renal function closely.  Chronic diastolic and systolic heart failure Remains euvolemic, monitor for signs of fluid overload as patient is on IV fluids.  Continue current medications.  Stage II sacral ulcer-POA Continue local wound care  Periumbilical adenopathy. Mild elevated CA 19 9 and elevated Ca1 25. Patient and family decided not to further investigate.  Severe malnutrition Due to dysphagia, continue protein supplementation and encourage p.o. intake.  Expect to improve if dysphagia improved.  DVT prophylaxis: SCDs Code Status: Full code Family Communication: Discussed with son at bedside Disposition Plan: Home with home health after EGD  Consultants:   GI, cardiology, pulmonology  Procedures:   None  Antimicrobials:  None   Objective: Vitals:   11/17/17 1406 11/17/17 2002 11/18/17 0421 11/18/17 0833  BP: 132/61 138/70 129/78   Pulse: 76 82 79   Resp: 18 20 20    Temp: 98.3 F (36.8 C) 98.1 F (36.7 C) (!) 97.4 F (  36.3 C)   TempSrc: Oral Oral Oral   SpO2: 100% 100% 100% 93%  Weight:      Height:        Intake/Output Summary (Last 24 hours) at 11/18/2017 1305 Last data filed at 11/18/2017  0600 Gross per 24 hour  Intake 969.37 ml  Output 550 ml  Net 419.37 ml   Filed Weights   11/15/17 0453 11/16/17 2007 11/17/17 0427  Weight: 56.2 kg 55.9 kg 46.6 kg    Examination:  General: Pt is alert, awake, not in acute distress Cardiovascular: RRR, S1/S2 +, no rubs, no gallops Respiratory: CTA bilaterally, no wheezing, no rhonchi Abdominal: Soft, NT, ND, bowel sounds + Extremities: no edema, no cyanosis  Data Reviewed: I have personally reviewed following labs and imaging studies  CBC: Recent Labs  Lab 11/13/17 0608 11/15/17 0607 11/16/17 0606 11/17/17 0545 11/18/17 0610  WBC 7.9 8.8 8.2 8.8 8.8  NEUTROABS 6.0 7.0 6.2 6.8 6.7  HGB 8.3* 7.7* 7.7* 7.7* 7.5*  HCT 26.5* 25.7* 25.5* 25.7* 25.2*  MCV 88.0 89.2 88.9 89.2 89.0  PLT 419* 415* 424* 431* 161*   Basic Metabolic Panel: Recent Labs  Lab 11/13/17 0717 11/15/17 0607 11/16/17 0606 11/17/17 0545 11/18/17 0610  NA 135 136 135 135 134*  K 4.2 3.9 3.9 4.4 3.4*  CL 97* 102 100 101 100  CO2 28 24 24 24 23   GLUCOSE 122* 136* 113* 127* 117*  BUN 15 16 15 14 12   CREATININE 1.38* 1.38* 1.37* 1.34* 1.25*  CALCIUM 9.7 9.5 9.4 9.5 9.4  MG  --   --   --  1.7  --   PHOS  --   --  2.4*  --   --    GFR: Estimated Creatinine Clearance: 22.9 mL/min (A) (by C-G formula based on SCr of 1.25 mg/dL (H)). Liver Function Tests: Recent Labs  Lab 11/16/17 0606  ALBUMIN 2.1*   No results for input(s): LIPASE, AMYLASE in the last 168 hours. No results for input(s): AMMONIA in the last 168 hours. Coagulation Profile: No results for input(s): INR, PROTIME in the last 168 hours. Cardiac Enzymes: No results for input(s): CKTOTAL, CKMB, CKMBINDEX, TROPONINI in the last 168 hours. BNP (last 3 results) No results for input(s): PROBNP in the last 8760 hours. HbA1C: No results for input(s): HGBA1C in the last 72 hours. CBG: Recent Labs  Lab 11/17/17 2003 11/18/17 0027 11/18/17 0422 11/18/17 0744 11/18/17 1131  GLUCAP  104* 95 100* 108* 155*   Lipid Profile: No results for input(s): CHOL, HDL, LDLCALC, TRIG, CHOLHDL, LDLDIRECT in the last 72 hours. Thyroid Function Tests: No results for input(s): TSH, T4TOTAL, FREET4, T3FREE, THYROIDAB in the last 72 hours. Anemia Panel: No results for input(s): VITAMINB12, FOLATE, FERRITIN, TIBC, IRON, RETICCTPCT in the last 72 hours. Sepsis Labs: No results for input(s): PROCALCITON, LATICACIDVEN in the last 168 hours.  No results found for this or any previous visit (from the past 240 hour(s)).    Radiology Studies: No results found.   Scheduled Meds: . amLODipine  5 mg Oral Daily  . atorvastatin  40 mg Oral Daily  . cloNIDine  0.1 mg Oral BID  . feeding supplement (ENSURE ENLIVE)  237 mL Oral BID BM  . insulin aspart  0-9 Units Subcutaneous Q4H  . isosorbide mononitrate  30 mg Oral Daily  . metoprolol tartrate  50 mg Oral BID  . pantoprazole  40 mg Oral BID  . polyethylene glycol  17 g Oral Daily  .  senna-docusate  1 tablet Oral BID   Continuous Infusions: . sodium chloride 50 mL/hr at 11/17/17 1512  . ferumoxytol 510 mg (11/16/17 1010)     LOS: 11 days   Time spent: Total of 15 minutes spent with pt, greater than 50% of which was spent in discussion of  treatment, counseling and coordination of care  Chipper Oman, MD Pager: Text Page via [email or web address]   If 7PM-7AM, please contact night-coverage [email or web address] 11/18/2017, 1:05 PM   Note - This record has been created using [employer]. Chart creation errors have been sought, but may not always have been located. Such creation errors do not reflect on the standard of medical care.

## 2017-11-19 LAB — CBC WITH DIFFERENTIAL/PLATELET
ABS IMMATURE GRANULOCYTES: 0.03 10*3/uL (ref 0.00–0.07)
Basophils Absolute: 0.1 10*3/uL (ref 0.0–0.1)
Basophils Relative: 1 %
EOS ABS: 0.1 10*3/uL (ref 0.0–0.5)
Eosinophils Relative: 1 %
HCT: 26.1 % — ABNORMAL LOW (ref 36.0–46.0)
Hemoglobin: 8 g/dL — ABNORMAL LOW (ref 12.0–15.0)
IMMATURE GRANULOCYTES: 0 %
LYMPHS ABS: 0.8 10*3/uL (ref 0.7–4.0)
Lymphocytes Relative: 10 %
MCH: 27 pg (ref 26.0–34.0)
MCHC: 30.7 g/dL (ref 30.0–36.0)
MCV: 88.2 fL (ref 80.0–100.0)
MONO ABS: 0.9 10*3/uL (ref 0.1–1.0)
Monocytes Relative: 11 %
NEUTROS PCT: 77 %
Neutro Abs: 6.7 10*3/uL (ref 1.7–7.7)
Platelets: 464 10*3/uL — ABNORMAL HIGH (ref 150–400)
RBC: 2.96 MIL/uL — ABNORMAL LOW (ref 3.87–5.11)
RDW: 16.1 % — AB (ref 11.5–15.5)
WBC: 8.6 10*3/uL (ref 4.0–10.5)
nRBC: 0 % (ref 0.0–0.2)

## 2017-11-19 LAB — BASIC METABOLIC PANEL
ANION GAP: 15 (ref 5–15)
BUN: 11 mg/dL (ref 8–23)
CALCIUM: 9.8 mg/dL (ref 8.9–10.3)
CO2: 21 mmol/L — AB (ref 22–32)
CREATININE: 1.32 mg/dL — AB (ref 0.44–1.00)
Chloride: 98 mmol/L (ref 98–111)
GFR calc Af Amer: 40 mL/min — ABNORMAL LOW (ref >60)
GFR calc non Af Amer: 35 mL/min — ABNORMAL LOW (ref >60)
GLUCOSE: 147 mg/dL — AB (ref 70–99)
Potassium: 3.5 mmol/L (ref 3.5–5.1)
Sodium: 134 mmol/L — ABNORMAL LOW (ref 135–145)

## 2017-11-19 LAB — GLUCOSE, CAPILLARY
GLUCOSE-CAPILLARY: 146 mg/dL — AB (ref 70–99)
GLUCOSE-CAPILLARY: 134 mg/dL — AB (ref 70–99)
GLUCOSE-CAPILLARY: 103 mg/dL — AB (ref 70–99)
Glucose-Capillary: 131 mg/dL — ABNORMAL HIGH (ref 70–99)
Glucose-Capillary: 142 mg/dL — ABNORMAL HIGH (ref 70–99)
Glucose-Capillary: 124 mg/dL — ABNORMAL HIGH (ref 70–99)
Glucose-Capillary: 116 mg/dL — ABNORMAL HIGH (ref 70–99)

## 2017-11-19 LAB — HEMOGLOBIN: Hemoglobin: 7.8 g/dL — ABNORMAL LOW (ref 12.0–15.0)

## 2017-11-19 NOTE — Progress Notes (Signed)
PROGRESS NOTE [facility]   Adriel Desrosier   HYI:[id number] DOB: 10/12/1927  DOA: 11/04/2017 PCP: Deland Pretty, MD   Brief Narrative:  [name] is a 84 year old female with medical history of diabetes mellitus type 2, CVA with left side residual weakness, CAD, hypertension, CKD, combined systolic and diastolic CHF who presented to the emergency department complaining of nausea and vomiting for 1 week prior to admission, associated with unintentional weight loss.  She was admitted with working diagnosis of dehydration due to dysphagia.   Subjective: No new concern, still difficulty with swallowing.  Not drinking much due to weekly throws up.  No acute events overnight.  Awaiting for EGD  Assessment & Plan: Dysphagia CT abdomen/pelvis body esophagram is relative really unremarkable except large hiatal hernia. 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  Patient on PPI.  Tolerating full liquid diet but feels full quickly. EGD diagnostic and therapeutic was schedule however patient was on Plavix and procedure was canceled.  Patient need to be off Plavix for 5 days therefore EGD scheduled for 10/21.  Unable to arrange this as an outpatient also patient son declining procedure to be done as an outpatient. Continue gentle hydration to avoid dehydration due to poor PO intake   Anemia of chronic disease Felt to be multifactorial due to malnutrition and CKD. ? GI bleed in setting of Plavix  Due to poor oral intake, patient receiving IV iron.  Hemoglobin slight decrease from admission.  No signs of overt bleeding.  Hemoglobin trending up work today.. Holding all  blood thinners, Continue to monitor CBC and transfuse if hemoglobin less than 7.  CAD, elevated troponin and sinus tachycardia. - remains stable  Patient had an episode of chest pain, troponin was found to be elevated, cardiology was consulted and felt to be  demand ischemia.  Patient declined any invasive procedure for further investigation.  Cardiology recommended to continue medical treatment with statin, metoprolol, isosorbide mononitrate and Plavix, however Plavix is on hold in view of procedure.  Echocardiogram shows diffuse hypokinesis, EF of 40 to 50% and grade 1 diastolic dysfunction.  Continue to monitor   Abnormal chest CT CT image showed groundglass opacities in the right lung, pulmonary was consulted and felt to be secondary to aspiration pneumonitis.  Recommend outpatient follow-up and repeat CT scan in 2 to 3 months.  Essential hypertension BP remains stable, continue amlodipine Lopressor and Imdur.  CKD stage III - stable Renal function remains at baseline, getting IVF due to poor oral intake .  Continue to monitor renal function closely.  Chronic diastolic and systolic heart failure Remains euvolemic, monitor for signs of fluid overload as patient is on IV fluids.  Continue current medications.  Stage II sacral ulcer-POA Continue local wound care  Periumbilical adenopathy. Mild elevated CA 19 9 and elevated Ca1 25. Patient and family decided not to further investigate.  Severe malnutrition Due to dysphagia, continue protein supplementation and encourage p.o. intake.  Expect to improve if dysphagia improved.  DVT prophylaxis: SCDs Code Status: Full code Family Communication: Discussed with son at bedside Disposition Plan: Home with home health after EGD  Consultants:   GI, cardiology, pulmonology  Procedures:   None  Antimicrobials:  None   Objective: Vitals:   11/18/17 0833 11/18/17 1309 11/18/17 2007 11/19/17 0403  BP:  (!) 147/79 (!) 153/70 (!) 150/87  Pulse:  71 99 93  Resp:  16 17 17   Temp:  98.2  F (36.8 C) 99.3 F (37.4 C) 98.1 F (36.7 C)  TempSrc:  Oral Oral Oral  SpO2: 93% 99% 100% 97%  Weight:   56.2 kg 56.5 kg  Height:        Intake/Output Summary (Last 24 hours) at 11/19/2017  1317 Last data filed at 11/19/2017 0404 Gross per 24 hour  Intake 991.77 ml  Output 650 ml  Net 341.77 ml   Filed Weights   11/17/17 0427 11/18/17 2007 11/19/17 0403  Weight: 46.6 kg 56.2 kg 56.5 kg    Examination:  General: NAD Cardiovascular: RRR, S1/S2 + Respiratory: CTA bilaterally Abdominal: Soft, NT, ND Extremities: no edema  Data Reviewed: I have personally reviewed following labs and imaging studies  CBC: Recent Labs  Lab 11/15/17 0607 11/16/17 0606 11/17/17 0545 11/18/17 0610 11/19/17 0858  WBC 8.8 8.2 8.8 8.8 8.6  NEUTROABS 7.0 6.2 6.8 6.7 6.7  HGB 7.7* 7.7* 7.7* 7.5* 7.8*  8.0*  HCT 25.7* 25.5* 25.7* 25.2* 26.1*  MCV 89.2 88.9 89.2 89.0 88.2  PLT 415* 424* 431* 433* 016*   Basic Metabolic Panel: Recent Labs  Lab 11/15/17 0607 11/16/17 0606 11/17/17 0545 11/18/17 0610 11/19/17 0858  NA 136 135 135 134* 134*  K 3.9 3.9 4.4 3.4* 3.5  CL 102 100 101 100 98  CO2 24 24 24 23  21*  GLUCOSE 136* 113* 127* 117* 147*  BUN 16 15 14 12 11   CREATININE 1.38* 1.37* 1.34* 1.25* 1.32*  CALCIUM 9.5 9.4 9.5 9.4 9.8  MG  --   --  1.7  --   --   PHOS  --  2.4*  --   --   --    GFR: Estimated Creatinine Clearance: 24.4 mL/min (A) (by C-G formula based on SCr of 1.32 mg/dL (H)). Liver Function Tests: Recent Labs  Lab 11/16/17 0606  ALBUMIN 2.1*   No results for input(s): LIPASE, AMYLASE in the last 168 hours. No results for input(s): AMMONIA in the last 168 hours. Coagulation Profile: No results for input(s): INR, PROTIME in the last 168 hours. Cardiac Enzymes: No results for input(s): CKTOTAL, CKMB, CKMBINDEX, TROPONINI in the last 168 hours. BNP (last 3 results) No results for input(s): PROBNP in the last 8760 hours. HbA1C: No results for input(s): HGBA1C in the last 72 hours. CBG: Recent Labs  Lab 11/18/17 2011 11/19/17 0004 11/19/17 0414 11/19/17 0726 11/19/17 1215  GLUCAP 131* 142* 146* 134* 124*   Lipid Profile: No results for input(s):  CHOL, HDL, LDLCALC, TRIG, CHOLHDL, LDLDIRECT in the last 72 hours. Thyroid Function Tests: No results for input(s): TSH, T4TOTAL, FREET4, T3FREE, THYROIDAB in the last 72 hours. Anemia Panel: No results for input(s): VITAMINB12, FOLATE, FERRITIN, TIBC, IRON, RETICCTPCT in the last 72 hours. Sepsis Labs: No results for input(s): PROCALCITON, LATICACIDVEN in the last 168 hours.  No results found for this or any previous visit (from the past 240 hour(s)).    Radiology Studies: No results found.   Scheduled Meds: . amLODipine  5 mg Oral Daily  . atorvastatin  40 mg Oral Daily  . cloNIDine  0.1 mg Oral BID  . feeding supplement (ENSURE ENLIVE)  237 mL Oral BID BM  . insulin aspart  0-9 Units Subcutaneous Q4H  . isosorbide mononitrate  30 mg Oral Daily  . metoprolol tartrate  50 mg Oral BID  . pantoprazole  40 mg Oral BID  . polyethylene glycol  17 g Oral Daily  . senna-docusate  1 tablet Oral BID  Continuous Infusions: . sodium chloride 50 mL/hr at 11/19/17 0300  . ferumoxytol 510 mg (11/16/17 1010)     LOS: 12 days   Time spent: Total of 15 minutes spent with pt, greater than 50% of which was spent in discussion of  treatment, counseling and coordination of care  Chipper Oman, MD Pager: Text Page via [email or web address]   If 7PM-7AM, please contact night-coverage [email or web address] 11/19/2017, 1:17 PM   Note - This record has been created using [employer]. Chart creation errors have been sought, but may not always have been located. Such creation errors do not reflect on the standard of medical care.

## 2017-11-20 ENCOUNTER — Encounter (HOSPITAL_COMMUNITY)
Admission: EM | Disposition: A | Discharge status: Skilled Nursing Facility | Payer: Self-pay | Source: Home / Self Care | Attending: Family Medicine

## 2017-11-20 ENCOUNTER — Inpatient Hospital Stay (HOSPITAL_COMMUNITY): Payer: Medicare Other | Admitting: Certified Registered Nurse Anesthetist

## 2017-11-20 ENCOUNTER — Encounter (HOSPITAL_COMMUNITY): Payer: Self-pay | Admitting: Anesthesiology

## 2017-11-20 DIAGNOSIS — R634 Abnormal weight loss: Secondary | ICD-10-CM

## 2017-11-20 DIAGNOSIS — K449 Diaphragmatic hernia without obstruction or gangrene: Secondary | ICD-10-CM

## 2017-11-20 DIAGNOSIS — R1310 Dysphagia, unspecified: Secondary | ICD-10-CM

## 2017-11-20 HISTORY — PX: ESOPHAGOGASTRODUODENOSCOPY (EGD) WITH PROPOFOL: SHX5813

## 2017-11-20 HISTORY — PX: BOTOX INJECTION: SHX5754

## 2017-11-20 LAB — CBC
HEMATOCRIT: 23.9 % — AB (ref 36.0–46.0)
Hemoglobin: 7.4 g/dL — ABNORMAL LOW (ref 12.0–15.0)
MCH: 27.5 pg (ref 26.0–34.0)
MCHC: 31 g/dL (ref 30.0–36.0)
MCV: 88.8 fL (ref 80.0–100.0)
Platelets: 400 10*3/uL (ref 150–400)
RBC: 2.69 MIL/uL — AB (ref 3.87–5.11)
RDW: 16.2 % — ABNORMAL HIGH (ref 11.5–15.5)
WBC: 8.4 10*3/uL (ref 4.0–10.5)
nRBC: 0 % (ref 0.0–0.2)

## 2017-11-20 LAB — BASIC METABOLIC PANEL
Anion gap: 12 (ref 5–15)
BUN: 11 mg/dL (ref 8–23)
CO2: 21 mmol/L — AB (ref 22–32)
CREATININE: 1.24 mg/dL — AB (ref 0.44–1.00)
Calcium: 9.3 mg/dL (ref 8.9–10.3)
Chloride: 101 mmol/L (ref 98–111)
GFR calc non Af Amer: 38 mL/min — ABNORMAL LOW (ref >60)
GFR, EST AFRICAN AMERICAN: 44 mL/min — AB (ref >60)
Glucose, Bld: 120 mg/dL — ABNORMAL HIGH (ref 70–99)
POTASSIUM: 3.6 mmol/L (ref 3.5–5.1)
Sodium: 134 mmol/L — ABNORMAL LOW (ref 135–145)

## 2017-11-20 LAB — GLUCOSE, CAPILLARY
GLUCOSE-CAPILLARY: 121 mg/dL — AB (ref 70–99)
GLUCOSE-CAPILLARY: 112 mg/dL — AB (ref 70–99)
GLUCOSE-CAPILLARY: 131 mg/dL — AB (ref 70–99)
Glucose-Capillary: 108 mg/dL — ABNORMAL HIGH (ref 70–99)
Glucose-Capillary: 120 mg/dL — ABNORMAL HIGH (ref 70–99)
Glucose-Capillary: 129 mg/dL — ABNORMAL HIGH (ref 70–99)
Glucose-Capillary: 137 mg/dL — ABNORMAL HIGH (ref 70–99)

## 2017-11-20 SURGERY — ESOPHAGOGASTRODUODENOSCOPY (EGD) WITH PROPOFOL
Anesthesia: General

## 2017-11-20 MED ORDER — LACTATED RINGERS IV SOLN
INTRAVENOUS | Status: DC
  Administered 2017-11-20: 1000 mL via INTRAVENOUS

## 2017-11-20 MED ORDER — ONABOTULINUMTOXINA 100 UNITS IJ SOLR
INTRAMUSCULAR | Status: AC
  Filled 2017-11-20: qty 100

## 2017-11-20 MED ORDER — PROPOFOL 500 MG/50ML IV EMUL
INTRAVENOUS | Status: DC | PRN
  Administered 2017-11-20: 125 ug/kg/min via INTRAVENOUS

## 2017-11-20 MED ORDER — LIDOCAINE 2% (20 MG/ML) 5 ML SYRINGE
INTRAMUSCULAR | Status: DC | PRN
  Administered 2017-11-20: 80 mg via INTRAVENOUS

## 2017-11-20 MED ORDER — SODIUM CHLORIDE 0.9 % IJ SOLN
INTRAMUSCULAR | Status: AC
  Filled 2017-11-20: qty 10

## 2017-11-20 MED ORDER — SODIUM CHLORIDE 0.9 % IJ SOLN
INTRAMUSCULAR | Status: DC | PRN
  Administered 2017-11-20: 4 mL via SUBMUCOSAL

## 2017-11-20 MED ORDER — PROPOFOL 10 MG/ML IV BOLUS
INTRAVENOUS | Status: DC | PRN
  Administered 2017-11-20: 40 mg via INTRAVENOUS

## 2017-11-20 SURGICAL SUPPLY — 15 items

## 2017-11-20 NOTE — Anesthesia Preprocedure Evaluation (Addendum)
Anesthesia Evaluation  Patient identified by MRN, date of birth, ID band Patient awake    Reviewed: Allergy & Precautions, NPO status , Patient's Chart, lab work & pertinent test results  Airway Mallampati: II  TM Distance: >3 FB Neck ROM: Full    Dental  (+) Edentulous Upper, Dental Advisory Given   Pulmonary    breath sounds clear to auscultation       Cardiovascular hypertension, Pt. on medications and Pt. on home beta blockers + CAD, + Past MI, + Cardiac Stents and + CABG   Rhythm:Regular Rate:Normal     Neuro/Psych CVA, Residual Symptoms    GI/Hepatic negative GI ROS, Neg liver ROS,   Endo/Other  diabetes, Type 2, Oral Hypoglycemic Agents  Renal/GU Renal InsufficiencyRenal disease     Musculoskeletal negative musculoskeletal ROS (+)   Abdominal Normal abdominal exam  (+)   Peds  Hematology   Anesthesia Other Findings   Reproductive/Obstetrics                            Lab Results  Component Value Date   WBC 8.4 11/20/2017   HGB 7.4 (L) 11/20/2017   HCT 23.9 (L) 11/20/2017   MCV 88.8 11/20/2017   PLT 400 11/20/2017   Lab Results  Component Value Date   CREATININE 1.24 (H) 11/20/2017   BUN 11 11/20/2017   NA 134 (L) 11/20/2017   K 3.6 11/20/2017   CL 101 11/20/2017   CO2 21 (L) 11/20/2017   Lab Results  Component Value Date   INR 1.02 11/08/2017   INR 0.92 02/09/2017   INR 1.01 09/20/2016   Echo: - Left ventricle: The cavity size was normal. There was mild focal   basal hypertrophy of the septum. Systolic function was mildly   reduced. The estimated ejection fraction was in the range of 45%   to 50%. Difficult to visualize. Consider Definity contrast if   clinically necessary. Diffuse hypokinesis. Doppler parameters are   consistent with abnormal left ventricular relaxation (grade 1   diastolic dysfunction). - Mitral valve: There was mild regurgitation directed  posteriorly. - Pulmonary arteries: Systolic pressure was mildly increased. PA   peak pressure: 32 mm Hg (S).   Anesthesia Physical Anesthesia Plan  ASA: III  Anesthesia Plan: General   Post-op Pain Management:    Induction: Intravenous  PONV Risk Score and Plan: 4 or greater and Ondansetron, Propofol infusion and Treatment may vary due to age or medical condition  Airway Management Planned: Natural Airway and Nasal Cannula  Additional Equipment: None  Intra-op Plan:   Post-operative Plan:   Informed Consent: I have reviewed the patients History and Physical, chart, labs and discussed the procedure including the risks, benefits and alternatives for the proposed anesthesia with the patient or authorized representative who has indicated his/her understanding and acceptance.     Plan Discussed with: CRNA  Anesthesia Plan Comments:        Anesthesia Quick Evaluation

## 2017-11-20 NOTE — Anesthesia Postprocedure Evaluation (Signed)
Anesthesia Post Note  Patient: [name]  Procedure(s) Performed: ESOPHAGOGASTRODUODENOSCOPY (EGD) WITH PROPOFOL (N/A ) BOTOX INJECTION     Patient location during evaluation: PACU Anesthesia Type: General Level of consciousness: awake and alert Pain management: pain level controlled Vital Signs Assessment: post-procedure vital signs reviewed and stable Respiratory status: spontaneous breathing, nonlabored ventilation, respiratory function stable and patient connected to nasal cannula oxygen Cardiovascular status: stable and blood pressure returned to baseline Postop Assessment: no apparent nausea or vomiting Anesthetic complications: no    Last Vitals:  Vitals:   11/20/17 1307 11/20/17 1344  BP: 134/61 127/70  Pulse: 85 80  Resp: (!) 24 20  Temp:  37.3 C  SpO2: 94% 98%    Last Pain:  Vitals:   11/20/17 1300  TempSrc:   PainSc: 0-No pain                 Effie Berkshire

## 2017-11-20 NOTE — Progress Notes (Signed)
PROGRESS NOTE [facility]   [name]   GLO:[id number] DOB: 12/28/1927  DOA: 11/04/2017 PCP: Deland Pretty, MD   Brief Narrative:  [name] is a 84 year old female with medical history of diabetes mellitus type 2, CVA with left side residual weakness, CAD, hypertension, CKD, combined systolic and diastolic CHF who presented to the emergency department complaining of nausea and vomiting for 1 week prior to admission, associated with unintentional weight loss.  She was admitted with working diagnosis of dehydration due to dysphagia.   Subjective: No new concerns, patient underwent EGD.  Denies chest pain, abdominal pain and shortness of breath.  Tolerating some liquids, but still most of what she eats comes back.  Assessment & Plan: Dysphagia CT abdomen/pelvis body esophagram is relative really unremarkable except large hiatal hernia. 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   Status post EGD 10/21 showed findings consistent with achalasia treated with botulism toxin injected.  3 cm hiatal hernia.  GI recommending full liquid diet today, head of bed elevated to at least 30 degrees at all time and they will evaluate in a.m. before advancing diet.  Continue to monitor.  Anemia of chronic disease Felt to be multifactorial due to malnutrition and CKD. ? GI bleed in setting of Plavix  Due to poor oral intake, patient receiving IV iron.  Hemoglobin decrease from admission.  No signs of overt bleeding.  Holding all blood thinners.  Continue to monitor CBC and transfuse if hemoglobin less than 7.  CAD, elevated troponin and sinus tachycardia. - remains stable  Patient had an episode of chest pain, troponin was found to be elevated, cardiology was consulted and felt to be demand ischemia.  Patient declined any invasive procedure for further investigation. Cardiology recommended to continue medical treatment with  statin, metoprolol, isosorbide mononitrate and Plavix, however Plavix is on hold in view of procedure.  Echocardiogram shows diffuse hypokinesis, EF of 40 to 50% and grade 1 diastolic dysfunction.  Continue to monitor   Abnormal chest CT CT image showed groundglass opacities in the right lung, pulmonary was consulted and felt to be secondary to aspiration pneumonitis.  Recommend outpatient follow-up and repeat CT scan in 2 to 3 months.  Essential hypertension BP remains stable, continue amlodipine Lopressor and Imdur.  CKD stage III - stable Renal function remains at baseline, getting IVF due to poor oral intake. Continue to monitor renal function closely.  Chronic diastolic and systolic heart failure Remains euvolemic, monitor for signs of fluid overload as patient is on IV fluids. Continue current medications.  Stage II sacral ulcer-POA Continue local wound care  Periumbilical adenopathy. Mild elevated CA 19 9 and elevated Ca1 25. Patient and family decided not to further investigate.  Severe malnutrition Due to dysphagia. Expect to improve if dysphagia improved.  Continue protein supplementation.  DVT prophylaxis: SCDs Code Status: Full code Family Communication: Discussed with son at bedside Disposition Plan: Home when cleared by GI  Consultants:   GI, cardiology, pulmonology  Procedures:   None  Antimicrobials:  None   Objective: Vitals:   11/20/17 1250 11/20/17 1255 11/20/17 1300 11/20/17 1307  BP: (!) 143/72  (!) 142/61 134/61  Pulse: 86 86 90 85  Resp: 20 19 17  (!) 24  Temp:      TempSrc:      SpO2: 93% 93% 94% 94%  Weight:      Height:        Intake/Output Summary (Last  24 hours) at 11/20/2017 1321 Last data filed at 11/20/2017 1243 Gross per 24 hour  Intake 500 ml  Output 1078 ml  Net -578 ml   Filed Weights   11/19/17 0403 11/20/17 0427 11/20/17 1147  Weight: 56.5 kg 58 kg 58 kg    Examination:  General: Pt is alert, awake, not in acute  distress Cardiovascular: RRR, S1/S2 Respiratory: CTA bilaterally Abdominal: Soft, NT, ND Extremities: no edema  Data Reviewed: I have personally reviewed following labs and imaging studies  CBC: Recent Labs  Lab 11/15/17 0607 11/16/17 0606 11/17/17 0545 11/18/17 0610 11/19/17 0858 11/20/17 0550  WBC 8.8 8.2 8.8 8.8 8.6 8.4  NEUTROABS 7.0 6.2 6.8 6.7 6.7  --   HGB 7.7* 7.7* 7.7* 7.5* 7.8*  8.0* 7.4*  HCT 25.7* 25.5* 25.7* 25.2* 26.1* 23.9*  MCV 89.2 88.9 89.2 89.0 88.2 88.8  PLT 415* 424* 431* 433* 464* 867   Basic Metabolic Panel: Recent Labs  Lab 11/16/17 0606 11/17/17 0545 11/18/17 0610 11/19/17 0858 11/20/17 0550  NA 135 135 134* 134* 134*  K 3.9 4.4 3.4* 3.5 3.6  CL 100 101 100 98 101  CO2 24 24 23  21* 21*  GLUCOSE 113* 127* 117* 147* 120*  BUN 15 14 12 11 11   CREATININE 1.37* 1.34* 1.25* 1.32* 1.24*  CALCIUM 9.4 9.5 9.4 9.8 9.3  MG  --  1.7  --   --   --   PHOS 2.4*  --   --   --   --    GFR: Estimated Creatinine Clearance: 25.9 mL/min (A) (by C-G formula based on SCr of 1.24 mg/dL (H)). Liver Function Tests: Recent Labs  Lab 11/16/17 0606  ALBUMIN 2.1*   No results for input(s): LIPASE, AMYLASE in the last 168 hours. No results for input(s): AMMONIA in the last 168 hours. Coagulation Profile: No results for input(s): INR, PROTIME in the last 168 hours. Cardiac Enzymes: No results for input(s): CKTOTAL, CKMB, CKMBINDEX, TROPONINI in the last 168 hours. BNP (last 3 results) No results for input(s): PROBNP in the last 8760 hours. HbA1C: No results for input(s): HGBA1C in the last 72 hours. CBG: Recent Labs  Lab 11/19/17 2004 11/19/17 2359 11/20/17 0425 11/20/17 0721 11/20/17 1129  GLUCAP 116* 108* 121* 120* 129*   Lipid Profile: No results for input(s): CHOL, HDL, LDLCALC, TRIG, CHOLHDL, LDLDIRECT in the last 72 hours. Thyroid Function Tests: No results for input(s): TSH, T4TOTAL, FREET4, T3FREE, THYROIDAB in the last 72 hours. Anemia  Panel: No results for input(s): VITAMINB12, FOLATE, FERRITIN, TIBC, IRON, RETICCTPCT in the last 72 hours. Sepsis Labs: No results for input(s): PROCALCITON, LATICACIDVEN in the last 168 hours.  No results found for this or any previous visit (from the past 240 hour(s)).    Radiology Studies: No results found.   Scheduled Meds: . [MAR Hold] amLODipine  5 mg Oral Daily  . [MAR Hold] atorvastatin  40 mg Oral Daily  . [MAR Hold] cloNIDine  0.1 mg Oral BID  . [MAR Hold] feeding supplement (ENSURE ENLIVE)  237 mL Oral BID BM  . [MAR Hold] insulin aspart  0-9 Units Subcutaneous Q4H  . [MAR Hold] isosorbide mononitrate  30 mg Oral Daily  . [MAR Hold] metoprolol tartrate  50 mg Oral BID  . [MAR Hold] pantoprazole  40 mg Oral BID  . [MAR Hold] polyethylene glycol  17 g Oral Daily  . [MAR Hold] senna-docusate  1 tablet Oral BID   Continuous Infusions: . sodium chloride 50  mL/hr at 11/20/17 1025  . [MAR Hold] ferumoxytol 510 mg (11/16/17 1010)  . lactated ringers 1,000 mL (11/20/17 1151)     LOS: 13 days   Time spent: Total of 15 minutes spent with pt, greater than 50% of which was spent in discussion of  treatment, counseling and coordination of care  Chipper Oman, MD Pager: Text Page via [email or web address]   If 7PM-7AM, please contact night-coverage [email or web address] 11/20/2017, 1:21 PM   Note - This record has been created using [employer]. Chart creation errors have been sought, but may not always have been located. Such creation errors do not reflect on the standard of medical care.

## 2017-11-20 NOTE — Progress Notes (Signed)
[name], female   DOB: 12-08-1927, 84 y.o.   MRN: [id number]    Progress Note   Subjective  Able to get down some liquids , everything else comes back up  Pt scheduled for EGD this afternoon Has been off Plavix -day #6  Son at bedside, who is a Psychologist, sport and exercise .   Objective   Vital signs in last 24 hours: Temp:  [99.1 F (37.3 C)-99.6 F (37.6 C)] 99.3 F (37.4 C) (10/21 0427) Pulse Rate:  [81-97] 81 (10/21 0427) Resp:  [16-19] 19 (10/21 0427) BP: (146-154)/(59-89) 146/59 (10/21 0427) SpO2:  [98 %-100 %] 100 % (10/21 0427) Weight:  [58 kg] 58 kg (10/21 0427) Last BM Date: 11/16/17 General:  elderly  AA  female in NAD, thin, frail appearing Heart:  Regular rate and rhythm; no murmurs Lungs: Respirations even and unlabored, lungs CTA bilaterally Abdomen:  Soft, nontender and nondistended. Normal bowel sounds. Extremities:  Without edema. Neurologic:  Alert and oriented,  grossly normal neurologically. Psych:  Cooperative. Normal mood and affect.  Intake/Output from previous day: 10/20 0701 - 10/21 0700 In: -  Out: 1075 [Urine:1075] Intake/Output this shift: No intake/output data recorded.  Lab Results: Recent Labs    11/18/17 0610 11/19/17 0858 11/20/17 0550  WBC 8.8 8.6 8.4  HGB 7.5* 7.8*  8.0* 7.4*  HCT 25.2* 26.1* 23.9*  PLT 433* 464* 400   BMET Recent Labs    11/18/17 0610 11/19/17 0858 11/20/17 0550  NA 134* 134* 134*  K 3.4* 3.5 3.6  CL 100 98 101  CO2 23 21* 21*  GLUCOSE 117* 147* 120*  BUN 12 11 11   CREATININE 1.25* 1.32* 1.24*  CALCIUM 9.4 9.8 9.3   LFT No results for input(s): PROT, ALBUMIN, AST, ALT, ALKPHOS, BILITOT, BILIDIR, IBILI in the last 72 hours. PT/INR No results for input(s): LABPROT, INR in the last 72 hours.      Assessment / Plan:    #1 84 yo female admitted with dehydration, progressive dysphagia, weight loss  UGI on 10/11 showed a distended  Contrast filled esophagus with abrupt narrowing at GE junction,  ultimately contrast passed in to esophagus-   R/O Achalasia, r/o benign vs malignant stricture  #2 mild odynophagia - r/o component of secondary esophagitis  #3 anemia - acute on chronic  With 3 gm drop in hgb  While on Plavix/lovenox - has been off both  X 6 days - hgb stable at 7.4   hemoccult pending - no stools last few days  #4 new lymph node in fat containing umbilical hernia , 2.3 cm cystic lesion left adnexa-   Family does not desire bx    Plan ; EGD with possible dilation /? Botox  This afternoon  At 2 pm Dr. Rush Landmark  Further plans pending findings Will need consideration for alternative means of nutrition  Soon .       Contact  [name] [place], P.A.-C               [phone]      Active Problems:   Hyperlipidemia   Essential hypertension   CORONARY ATHEROSCLEROSIS NATIVE CORONARY ARTERY   Diabetes mellitus with complication (HCC)   Hemiparesis affecting left side as late effect of cerebrovascular accident (CVA) (HCC)   Stage 3 chronic kidney disease (HCC)   Malnutrition of moderate degree   Dehydration   Abdominal pain   Pressure injury of skin   Bilious vomiting with nausea   Enlarged lymph node  Dysphagia   Nausea and vomiting   Weight loss   Abnormal finding on GI tract imaging     LOS: 13 days   [name]  11/20/2017, 9:13 AM

## 2017-11-20 NOTE — Transfer of Care (Signed)
Immediate Anesthesia Transfer of Care Note  Patient: [name]  Procedure(s) Performed: ESOPHAGOGASTRODUODENOSCOPY (EGD) WITH PROPOFOL (N/A ) BOTOX INJECTION  Patient Location: PACU and Endoscopy Unit  Anesthesia Type:MAC  Level of Consciousness: drowsy  Airway & Oxygen Therapy: Patient Spontanous Breathing and Patient connected to nasal cannula oxygen  Post-op Assessment: Report given to RN and Post -op Vital signs reviewed and stable  Post vital signs: Reviewed and stable  Last Vitals:  Vitals Value Taken Time  BP    Temp    Pulse 86 11/20/2017 12:41 PM  Resp 23 11/20/2017 12:41 PM  SpO2 100 % 11/20/2017 12:41 PM  Vitals shown include unvalidated device data.  Last Pain:  Vitals:   11/20/17 1147  TempSrc: Oral  PainSc: 0-No pain      Patients Stated Pain Goal: 0 (35/32/99 2426)  Complications: No apparent anesthesia complications

## 2017-11-20 NOTE — Interval H&P Note (Signed)
History and Physical Interval Note:  11/20/2017 12:05 PM  [name]  has presented today for surgery, with the diagnosis of dysphagia  The various methods of treatment have been discussed with the patient and family. After consideration of risks, benefits and other options for treatment, the patient has consented to  Procedure(s): ESOPHAGOGASTRODUODENOSCOPY (EGD) WITH PROPOFOL (N/A) as a surgical intervention .  [name] history has been reviewed, patient examined, no change in status, stable for surgery.  I have reviewed the patient's chart and labs.  Questions were answered to the patient's satisfaction.    I spoke with Dr. Rush Landmark about this patient's case, read chart notes, personally reviewed barium swallow and UGIS images as well as the recent CT abdomen report.  I then spoke with the patient, examined her, and spoke with her son by phone.  In my opinion, this appears most likely to be achalasia, less likely stricture or malignancy.  All questions answered and patient and son ready to proceed with EGD with possible biopsies, possible dilation and possible BoTox injection.   Nelida Meuse III

## 2017-11-20 NOTE — Progress Notes (Signed)
PT Cancellation Note  Patient Details Name: [name] MRN: [id number] DOB: 03-12-1927   Cancelled Treatment:     EGD today.  Pt has been evaluated with rec for SNF.     Rica Koyanagi  PTA Acute  Rehabilitation Services Pager      [phone] Office      [phone]

## 2017-11-20 NOTE — Progress Notes (Signed)
Nutrition Follow-up  DOCUMENTATION CODES:   Non-severe (moderate) malnutrition in context of chronic illness  INTERVENTION:   Continue Ensure Enlive po BID, each supplement provides 350 kcal and 20 grams of protein  NUTRITION DIAGNOSIS:   Moderate Malnutrition related to dysphagia(h/o CVA) as evidenced by percent weight loss, energy intake < or equal to 75% for > or equal to 1 month, moderate fat depletion, moderate muscle depletion.  Ongoing.  GOAL:   Patient will meet greater than or equal to 90% of their needs  Not meeting.  MONITOR:   PO intake, Supplement acceptance, Labs, Weight trends, I & O's, Skin  ASSESSMENT:   84 y.o. female with medical history significant of DM 2, CVA left-sided weakness, history of CAD,  hypertension, hyperlipidemia, CKD 3, combined systolic diastolic CHF SLP evaluated 10/6: mild aspiration risk. Recommended MBS. 10/21 -EGD w/ botox injection. Noted abnormal esophageal motility.  Patient has been awaiting EGD procedure since 10/7. Pt was tolerating liquids but swallowing difficulty persisted. Plan per GI, full liquid diet today and will assess 10/22 for ability to advance diet.   Patient has been drinking Ensure supplements but none yet today d/t procedure.   Weights are now stable with admission weight.  Diet Order:   Diet Order            Diet full liquid Room service appropriate? Yes; Fluid consistency: Thin  Diet effective now              EDUCATION NEEDS:   Education needs have been addressed  Skin:  Skin Assessment: Skin Integrity Issues: Skin Integrity Issues:: Stage II Stage II: sacrum  Last BM:  10/17  Height:   Ht Readings from Last 1 Encounters:  11/20/17 5\' 3"  (1.6 m)    Weight:   Wt Readings from Last 1 Encounters:  11/20/17 58 kg    Ideal Body Weight:  52.3 kg  BMI:  Body mass index is 22.65 kg/m.  Estimated Nutritional Needs:   Kcal:  1600-1800  Protein:  70-80g  Fluid:  1.8L/day  Clayton Bibles, MS, RD, LDN [place] Diet[facility] Pager: [phone] After Hours Pager: [phone]

## 2017-11-20 NOTE — Progress Notes (Signed)
[name] expressed desire to pursue skilled nursing facility, when ready for discharge.

## 2017-11-20 NOTE — Op Note (Signed)
[facility] Patient Name: [name] Procedure Date: 11/20/2017 MRN: [id number] Attending MD: [name] , MD Date of Birth: Jun 16, 1927 CSN: [id number] Age: 84 Admit Type: Outpatient Procedure:                Upper GI endoscopy Indications:              Dysphagia, Abnormal UGI series, Weight loss Providers:                Darlene Mussel L. Darlene Carrow, MD, Darlene Ramp. [place]en Dome, RN, [name], Technician, Darlene [place] CRNA Referring MD:             [facility] Medicines:                Monitored Anesthesia Care Complications:            No immediate complications. Estimated Blood Loss:     Estimated blood loss was minimal. Procedure:                Pre-Anesthesia Assessment:                           - Prior to the procedure, a History and Physical                            was performed, and patient medications and                            allergies were reviewed. [name] tolerance of                            previous anesthesia was also reviewed. The risks                            and benefits of the procedure and the sedation                            options and risks were discussed with the patient.                            All questions were answered, and informed consent                            was obtained. Prior Anticoagulants: The patient has                            taken Plavix (clopidogrel), last dose was 6 days                            prior to procedure. ASA Grade Assessment: III - A                            patient with severe systemic disease. After  reviewing the risks and benefits, the patient was                            deemed in satisfactory condition to undergo the                            procedure.                           After obtaining informed consent, the endoscope was                            passed under direct vision. Throughout the            procedure, the patient's blood pressure, pulse, and                            oxygen saturations were monitored continuously. The                            [id number]-H190 ([id number]) Olympus adult endoscope was                            introduced through the mouth, and advanced to the                            second part of duodenum. The upper GI endoscopy was                            accomplished without difficulty. The patient                            tolerated the procedure well. Scope In: Scope Out: Findings:      The lumen of the esophagus was moderately dilated.      Abnormal motility was noted in the esophagus. There was no visible       motility of the esophageal body. The distal esophagus/lower esophageal       sphincter is mildly spastic, but gives up passage to the endoscope. Area       was successfully injected with 100 units botulinum toxin (25 units each       in 4-quadrant fashion).      A 3-4 cm hiatal hernia was present, with a possible para-esophageal       component.      The exam of the esophagus was otherwise normal. Specifically, no mass or       stricture was seen.      The stomach was normal.      The cardia and gastric fundus were normal on retroflexion.      The examined duodenum was normal. Impression:               - Dilation in the entire esophagus.                           - Abnormal esophageal motility, suspicious for  achalasia. Injected with botulinum toxin.                           - 3 cm hiatal hernia.                           - Normal stomach.                           - Normal examined duodenum.                           - No specimens collected. Moderate Sedation:      MAC sedation used Recommendation:           - Return patient to hospital ward for ongoing care.                           - Full liquid diet today. Await GI evaluation                            tomorrow before advancing diet.                            - Head of bed elevated to at least 30 degrees at                            all times. Procedure Code(s):        --- Professional ---                           [phone], Esophagogastroduodenoscopy, flexible,                            transoral; with directed submucosal injection(s),                            any substance Diagnosis Code(s):        --- Professional ---                           R13.10, Dysphagia, unspecified                           R63.4, Abnormal weight loss                           R93.3, Abnormal findings on diagnostic imaging of                            other parts of digestive tract CPT copyright 2018 American Medical Association. All rights reserved. The codes documented in this report are preliminary and upon coder review may  be revised to meet current compliance requirements. [name] L. Darlene Carrow, MD 11/20/2017 12:43:49 PM This report has been signed electronically. Number of Addenda: 0

## 2017-11-20 NOTE — Care Management Important Message (Signed)
Important Message  Patient Details  Name: [name] MRN: [id number] Date of Birth: December 31, 1927   Medicare Important Message Given:  Yes    Kerin Salen 11/20/2017, 12:50 [place] Message  Patient Details  Name: [name] MRN: [id number] Date of Birth: 09-30-1927   Medicare Important Message Given:  Yes    Kerin Salen 11/20/2017, 12:50 PM

## 2017-11-21 LAB — CBC WITH DIFFERENTIAL/PLATELET
Abs Immature Granulocytes: 0.05 10*3/uL (ref 0.00–0.07)
BASOS ABS: 0.1 10*3/uL (ref 0.0–0.1)
Basophils Relative: 1 %
Eosinophils Absolute: 0.1 10*3/uL (ref 0.0–0.5)
Eosinophils Relative: 1 %
HEMATOCRIT: 29.2 % — AB (ref 36.0–46.0)
Hemoglobin: 8.8 g/dL — ABNORMAL LOW (ref 12.0–15.0)
IMMATURE GRANULOCYTES: 1 %
LYMPHS PCT: 10 %
Lymphs Abs: 0.9 10*3/uL (ref 0.7–4.0)
MCH: 27 pg (ref 26.0–34.0)
MCHC: 30.1 g/dL (ref 30.0–36.0)
MCV: 89.6 fL (ref 80.0–100.0)
Monocytes Absolute: 1 10*3/uL (ref 0.1–1.0)
Monocytes Relative: 10 %
NEUTROS PCT: 77 %
NRBC: 0 % (ref 0.0–0.2)
Neutro Abs: 7.8 10*3/uL — ABNORMAL HIGH (ref 1.7–7.7)
Platelets: 411 10*3/uL — ABNORMAL HIGH (ref 150–400)
RBC: 3.26 MIL/uL — AB (ref 3.87–5.11)
RDW: 16.5 % — AB (ref 11.5–15.5)
WBC: 9.9 10*3/uL (ref 4.0–10.5)

## 2017-11-21 LAB — GLUCOSE, CAPILLARY
GLUCOSE-CAPILLARY: 102 mg/dL — AB (ref 70–99)
GLUCOSE-CAPILLARY: 124 mg/dL — AB (ref 70–99)
Glucose-Capillary: 130 mg/dL — ABNORMAL HIGH (ref 70–99)
Glucose-Capillary: 132 mg/dL — ABNORMAL HIGH (ref 70–99)
Glucose-Capillary: 114 mg/dL — ABNORMAL HIGH (ref 70–99)

## 2017-11-21 MED ORDER — INSULIN ASPART 100 UNIT/ML ~~LOC~~ SOLN
0.0000 [IU] | Freq: Three times a day (TID) | SUBCUTANEOUS | Status: DC
  Administered 2017-11-21 – 2017-11-22 (×4): 1 [IU] via SUBCUTANEOUS
  Administered 2017-11-23 – 2017-11-24 (×3): 2 [IU] via SUBCUTANEOUS
  Administered 2017-11-24: 1 [IU] via SUBCUTANEOUS

## 2017-11-21 NOTE — Progress Notes (Signed)
      Progress Note   Subjective  Patient is s/p EGD yesterday with Dr. Loletha Carrow. Findings were grossly consistent with achalasia, empiric Botox injection were made to the LES. She states she tolerated some liquids yesterday, she thinks the procedure helped, states she notices a difference. Did okay today with liquids, but otherwise she did feel some nausea and had an episode of vomiting with PT. She feels she forced herself to do PT and should not have done it, she states she feels fine at this time otherwise.    Objective   Vital signs in last 24 hours: Temp:  [97.7 F (36.5 C)-100.4 F (38 C)] 97.7 F (36.5 C) (10/22 0425) Pulse Rate:  [80-94] 86 (10/22 0425) Resp:  [17-30] 20 (10/22 0425) BP: (114-163)/(61-81) 114/81 (10/22 0425) SpO2:  [93 %-100 %] 100 % (10/22 0425) Weight:  [58 kg] 58 kg (10/21 1147) Last BM Date: 11/20/17 General:    AA female in NAD Heart:  Regular rate and rhythm; no murmurs Lungs: Respirations even and unlabored, Abdomen:  Soft, nontender and nondistended. N Extremities:  Without edema. Neurologic:  Alert and oriented,  grossly normal neurologically. Psych:  Cooperative. Normal mood and affect.  Intake/Output from previous day: 10/21 0701 - 10/22 0700 In: 1820 [P.O.:120; I.V.:1700] Out: 853 [Urine:850; Blood:3] Intake/Output this shift: Total I/O In: 130.9 [I.V.:130.9] Out: -   Lab Results: Recent Labs    11/19/17 0858 11/20/17 0550 11/21/17 0601  WBC 8.6 8.4 9.9  HGB 7.8*  8.0* 7.4* 8.8*  HCT 26.1* 23.9* 29.2*  PLT 464* 400 411*   BMET Recent Labs    11/19/17 0858 11/20/17 0550  NA 134* 134*  K 3.5 3.6  CL 98 101  CO2 21* 21*  GLUCOSE 147* 120*  BUN 11 11  CREATININE 1.32* 1.24*  CALCIUM 9.8 9.3   LFT No results for input(s): PROT, ALBUMIN, AST, ALT, ALKPHOS, BILITOT, BILIDIR, IBILI in the last 72 hours. PT/INR No results for input(s): LABPROT, INR in the last 72 hours.  Studies/Results: No results found.     Assessment / Plan:   84 y/o female admitted with dehydration, dysphagia, weight loss. UGI series on 10/11 showed changes concerning for achalasia. EGD yesterday showed no evidence of malignancy, changes most c/w achalasia and Botox empirically injected into the LES.  So far she thinks the procedure has helped with her swallowing, although she's anxious about advancing her diet too quickly. If she feels most comfortable with full liquids that is fine as long as she tolerates it and can get her caloric intake in. Can try pureed / soft in small amounts when she is ready.     Otherwise, abnormal lymph node and left adnexal lesion noted on CT scan, family declining biopsy.   Per chart the patient is hoping to go transition to skilled nursing facility from here in the upcoming days. Call with questions in the interim if any further issues with dysphagia. She should remain upright when eating anything to help given dysmotility of her esophagus. We can coordinate follow up for her with our office after her discharge.  Henderson Cellar, MD [facility]

## 2017-11-21 NOTE — Progress Notes (Signed)
CSW consulted for SNF placement.  CSW followed up with patient/patient's son [name]) at bedside regarding consult for SNF. Patient/patient's son confirmed interest in SNF for ST rehab. [name] reported that patient prefers [facility]. CSW agreed to complete FL2 and follow up with preferred SNF.  CSW will complete FL2 and follow up with [facility].  Abundio Miu, [place] Social Worker [facility] Cell#: [phone]

## 2017-11-21 NOTE — Telephone Encounter (Signed)
Patient remains in [facility] hospital at this time.

## 2017-11-21 NOTE — Clinical Social Work Placement (Signed)
Patient received and accepted bed offer at [facility]. Facility confirmed bed offer and agreed to start insurance authorization [facility] Medicare). CSW will continue to follow and assist with discharge planning.  CLINICAL SOCIAL WORK PLACEMENT  NOTE  Date:  11/21/2017  Patient Details  Name: [name] MRN: [id number] Date of Birth: 1927-05-15  Clinical Social Work is seeking post-discharge placement for this patient at the [place] level of care (*CSW will initial, date and re-position this form in  chart as items are completed):  Yes   Patient/family provided with [facility] Work Department's list of facilities offering this level of care within the geographic area requested by the patient (or if unable, by the patient's family).  Yes   Patient/family informed of their freedom to choose among providers that offer the needed level of care, that participate in Medicare, Medicaid or managed care program needed by the patient, have an available bed and are willing to accept the patient.  Yes   Patient/family informed of [facility]'s ownership interest in [facility] and [facility], as well as of the fact that they are under no obligation to receive care at these facilities.  PASRR submitted to EDS on       PASRR number received on       Existing PASRR number confirmed on 11/21/17     FL2 transmitted to all facilities in geographic area requested by pt/family on 11/21/17     FL2 transmitted to all facilities within larger geographic area on       Patient informed that his/her managed care company has contracts with or will negotiate with certain facilities, including the following:        Yes   Patient/family informed of bed offers received.  Patient chooses bed at [facility]     Physician recommends and patient chooses bed at      Patient to be transferred to   on  .  Patient to be transferred to facility by       Patient family  notified on   of transfer.  Name of family member notified:        PHYSICIAN       Additional Comment:    _______________________________________________ Burnis Medin, LCSW 11/21/2017, 4:09 PM

## 2017-11-21 NOTE — Progress Notes (Signed)
Physical Therapy Treatment Patient Details Name: Tonica Brasington MRN: [id number] DOB: Jul 03, 1927 Today's Date: 11/21/2017    History of Present Illness Shoshannah Faubert is a 84 y.o. female with medical history significant of DM 2, CVA left-sided weakness, history of CAD,  hypertension, hyperlipidemia, CKD 3, combined systolic diastolic CHF; admitted with dehydration, wt loss, dysphagia, EGD 11/14/17 scheduled    PT Comments    Assisted OOB to amb to bathroom required + 2 assist and increased time.  Pt very weak, tired and unsteady.  While on toilet pt c/o nausea followed by a small amount of emesis.  Reported to RN.  Assisted back to bed + 2 assist. Pt will need ST Rehab at SNF prior to returning home.  Follow Up Recommendations  SNF     Equipment Recommendations  None recommended by PT    Recommendations for Other Services       Precautions / Restrictions Precautions Precautions: Fall Precaution Comments: CVA L hemi Restrictions Weight Bearing Restrictions: No    Mobility  Bed Mobility Overal bed mobility: Needs Assistance Bed Mobility: Supine to Sit     Supine to sit: Mod assist;Max assist Sit to supine: Max assist;Total assist   General bed mobility comments: required increased assist and time to complete  Transfers Overall transfer level: Needs assistance Equipment used: Rolling walker (2 wheeled) Transfers: Sit to/from [employer] Transfers   Stand pivot transfers: Max assist;Total assist       General transfer comment: required increased assist to transfer OOB and on/off toilet.  Very weak.  Tired.  Unsteady.  Poor flex posture.    Ambulation/Gait Ambulation/Gait assistance: Max assist;Total assist;+2 physical assistance;+2 safety/equipment Gait Distance (Feet): 20 Feet(10 feet x 2 to and from bathroom) Assistive device: Rolling walker (2 wheeled) Gait Pattern/deviations: Step-to pattern;Drifts right/left;Trunk flexed;Decreased step length -  right;Decreased step length - left Gait velocity: decreased   General Gait Details: very unsteady gait and poor balance with limited activity tolerance.  amb to and from bathroom was all she could tolerate.  HIGH FALL RISK.     Stairs             Wheelchair Mobility    Modified Rankin (Stroke Patients Only)       Balance Overall balance assessment: Needs assistance Sitting-balance support: Bilateral upper extremity supported;Feet supported Sitting balance-Leahy Scale: Fair       Standing balance-Leahy Scale: Poor                              Cognition Arousal/Alertness: Awake/alert Behavior During Therapy: WFL for tasks assessed/performed Overall Cognitive Status: Within Functional Limits for tasks assessed                                 General Comments: feels "bad" and "tired"       Exercises      General Comments        Pertinent Vitals/Pain Pain Assessment: No/denies pain    Home Living                      Prior Function            PT Goals (current goals can now be found in the care plan section) Progress towards PT goals: Progressing toward goals    Frequency    Min 2X/week      PT Plan Current  plan remains appropriate;Frequency needs to be updated    Co-evaluation              AM-PAC PT "6 Clicks" Daily Activity  Outcome Measure  Difficulty turning over in bed (including adjusting bedclothes, sheets and blankets)?: A Lot Difficulty moving from lying on back to sitting on the side of the bed? : A Lot Difficulty sitting down on and standing up from a chair with arms (e.g., wheelchair, bedside commode, etc,.)?: A Lot Help needed moving to and from a bed to chair (including a wheelchair)?: A Lot Help needed walking in hospital room?: A Lot Help needed climbing 3-5 steps with a railing? : Total 6 Click Score: 11    End of Session Equipment Utilized During Treatment: Gait belt Activity  Tolerance: Patient limited by fatigue Patient left: in bed;with call bell/phone within reach;with bed alarm set Nurse Communication: (pt vomited while in bathroom ) PT Visit Diagnosis: Muscle weakness (generalized) (M62.81)     Time: 3762-8315 PT Time Calculation (min) (ACUTE ONLY): 45 min  Charges:  $Gait Training: 8-22 mins $Therapeutic Activity: 23-37 mins                     Rica Koyanagi  PTA Acute  Rehabilitation Services Pager      [phone] Office      [phone]

## 2017-11-21 NOTE — Progress Notes (Signed)
PROGRESS NOTE [facility]   Antoinett Dorman   DDU:[id number] DOB: 1927-04-25  DOA: 11/04/2017 PCP: Deland Pretty, MD   Brief Narrative:  [name] is a 84 year old female with medical history of diabetes mellitus type 2, CVA with left side residual weakness, CAD, hypertension, CKD, combined systolic and diastolic CHF who presented to the emergency department complaining of nausea and vomiting for 1 week prior to admission, associated with unintentional weight loss.  She was admitted with working diagnosis of dehydration due to dysphagia.   Subjective: Patient seen and examined, doing well after the procedure, feeling comfortable with full liquids.  Denies abdominal pain, nausea and vomiting.  Assessment & Plan: Dysphagia - Achalasia CT abdomen/pelvis body esophagram is relative really unremarkable except large hiatal hernia. 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   Status post EGD 10/21 showed findings consistent with achalasia.  Patient was treated with botulism toxin.  Per GI advance diet when patient feels comfortable for now continue full liquids, can try pure and soft in small amount.  Anemia of chronic disease Felt to be multifactorial due to malnutrition and CKD. ? GI bleed in setting of Plavix  Due to poor oral intake, patient receiving IV iron.  Hemoglobin down from baseline.  No signs of overt bleeding.  Hemoglobin stable.  Holding Plavix.  Monitor CBC and transfuse if hemoglobin less than 7.   CAD, elevated troponin and sinus tachycardia. - remains stable  Patient had an episode of chest pain, troponin was found to be elevated, cardiology was consulted and felt to be demand ischemia.  Patient declined any invasive procedure for further investigation. Cardiology recommended to continue medical treatment with statin, metoprolol, isosorbide mononitrate and Plavix, however Plavix is on hold in view of  procedure.  Echocardiogram shows diffuse hypokinesis, EF of 40 to 50% and grade 1 diastolic dysfunction.  Continue to monitor   Abnormal chest CT CT image showed groundglass opacities in the right lung, pulmonary was consulted and felt to be secondary to aspiration pneumonitis.  Recommend outpatient follow-up and repeat CT scan in 2 to 3 months.  Essential hypertension Blood pressure stable, continue amlodipine Lopressor and Imdur.  CKD stage III - stable Renal function remains stable, patient tolerating full liquid diet well, will DC IV fluids.  Monitor renal function in a.m.  Avoid nephrotoxic agent.  Chronic diastolic and systolic heart failure Continues to be euvolemic.  Continue current regimen.  Monitor for signs of fluid overload.  Stage II sacral ulcer-POA Continue local wound care  Periumbilical adenopathy. Mild elevated CA 19 9 and elevated Ca1 25. Patient and family decided not to further investigate.  Severe malnutrition Due to dysphagia. Expect to improve if dysphagia improved.  Continue protein supplementation.  DVT prophylaxis: SCDs Code Status: Full code Family Communication: Discussed with son at bedside Disposition Plan: SNF in a.m. if tolerating diet well  Consultants:   GI, cardiology, pulmonology  Procedures:   None  Antimicrobials:  None   Objective: Vitals:   11/20/17 2246 11/21/17 0425 11/21/17 1308 11/21/17 1343  BP:  114/81  132/64  Pulse:  86  87  Resp:  20    Temp: 98.3 F (36.8 C) 97.7 F (36.5 C)  98.3 F (36.8 C)  TempSrc: Oral   Oral  SpO2:  100%  99%  Weight:   58.1 kg   Height:        Intake/Output Summary (Last 24 hours) at 11/21/2017 1505 Last  data filed at 11/21/2017 1400 Gross per 24 hour  Intake 1650.83 ml  Output 850 ml  Net 800.83 ml   Filed Weights   11/20/17 0427 11/20/17 1147 11/21/17 1308  Weight: 58 kg 58 kg 58.1 kg    Examination:  General: Pt is alert, awake, not in acute distress Cardiovascular:  RRR, S1/S2 +, no rubs, no gallops Respiratory: CTA bilaterally, no wheezing, no rhonchi Abdominal: Soft, NT, ND, bowel sounds + Extremities: no edema, no cyanosis  Data Reviewed: I have personally reviewed following labs and imaging studies  CBC: Recent Labs  Lab 11/16/17 0606 11/17/17 0545 11/18/17 0610 11/19/17 0858 11/20/17 0550 11/21/17 0601  WBC 8.2 8.8 8.8 8.6 8.4 9.9  NEUTROABS 6.2 6.8 6.7 6.7  --  7.8*  HGB 7.7* 7.7* 7.5* 7.8*  8.0* 7.4* 8.8*  HCT 25.5* 25.7* 25.2* 26.1* 23.9* 29.2*  MCV 88.9 89.2 89.0 88.2 88.8 89.6  PLT 424* 431* 433* 464* 400 170*   Basic Metabolic Panel: Recent Labs  Lab 11/16/17 0606 11/17/17 0545 11/18/17 0610 11/19/17 0858 11/20/17 0550  NA 135 135 134* 134* 134*  K 3.9 4.4 3.4* 3.5 3.6  CL 100 101 100 98 101  CO2 24 24 23  21* 21*  GLUCOSE 113* 127* 117* 147* 120*  BUN 15 14 12 11 11   CREATININE 1.37* 1.34* 1.25* 1.32* 1.24*  CALCIUM 9.4 9.5 9.4 9.8 9.3  MG  --  1.7  --   --   --   PHOS 2.4*  --   --   --   --    GFR: Estimated Creatinine Clearance: 25.9 mL/min (A) (by C-G formula based on SCr of 1.24 mg/dL (H)). Liver Function Tests: Recent Labs  Lab 11/16/17 0606  ALBUMIN 2.1*   No results for input(s): LIPASE, AMYLASE in the last 168 hours. No results for input(s): AMMONIA in the last 168 hours. Coagulation Profile: No results for input(s): INR, PROTIME in the last 168 hours. Cardiac Enzymes: No results for input(s): CKTOTAL, CKMB, CKMBINDEX, TROPONINI in the last 168 hours. BNP (last 3 results) No results for input(s): PROBNP in the last 8760 hours. HbA1C: No results for input(s): HGBA1C in the last 72 hours. CBG: Recent Labs  Lab 11/20/17 2013 11/20/17 2328 11/21/17 0423 11/21/17 0749 11/21/17 1158  GLUCAP 112* 131* 102* 124* 130*   Lipid Profile: No results for input(s): CHOL, HDL, LDLCALC, TRIG, CHOLHDL, LDLDIRECT in the last 72 hours. Thyroid Function Tests: No results for input(s): TSH, T4TOTAL, FREET4,  T3FREE, THYROIDAB in the last 72 hours. Anemia Panel: No results for input(s): VITAMINB12, FOLATE, FERRITIN, TIBC, IRON, RETICCTPCT in the last 72 hours. Sepsis Labs: No results for input(s): PROCALCITON, LATICACIDVEN in the last 168 hours.  No results found for this or any previous visit (from the past 240 hour(s)).    Radiology Studies: No results found.   Scheduled Meds: . amLODipine  5 mg Oral Daily  . atorvastatin  40 mg Oral Daily  . cloNIDine  0.1 mg Oral BID  . feeding supplement (ENSURE ENLIVE)  237 mL Oral BID BM  . insulin aspart  0-9 Units Subcutaneous TID WC  . isosorbide mononitrate  30 mg Oral Daily  . metoprolol tartrate  50 mg Oral BID  . pantoprazole  40 mg Oral BID  . polyethylene glycol  17 g Oral Daily  . senna-docusate  1 tablet Oral BID   Continuous Infusions: . sodium chloride 50 mL/hr at 11/21/17 1400  . ferumoxytol 510 mg (11/16/17 1010)  LOS: 14 days   Time spent: Total of 15 minutes spent with pt, greater than 50% of which was spent in discussion of  treatment, counseling and coordination of care  Chipper Oman, MD Pager: Text Page via [email or web address]   If 7PM-7AM, please contact night-coverage [email or web address] 11/21/2017, 3:05 PM   Note - This record has been created using [employer]. Chart creation errors have been sought, but may not always have been located. Such creation errors do not reflect on the standard of medical care.

## 2017-11-21 NOTE — Progress Notes (Signed)
Occupational Therapy Treatment Patient Details Name: [name] MRN: [id number] DOB: April 23, 1927 Today's Date: 11/21/2017    History of present illness [name] is a 84 y.o. female with medical history significant of DM 2, CVA left-sided weakness, history of CAD,  hypertension, hyperlipidemia, CKD 3, combined systolic diastolic CHF; admitted with dehydration, wt loss, dysphagia, EGD 11/14/17 scheduled   OT comments  Noted on chart son now interested in SNF  Follow Up Recommendations  Home health OT;Supervision/Assistance - 24 hour;SNF    Equipment Recommendations  None recommended by OT    Recommendations for Other Services      Precautions / Restrictions Precautions Precautions: Fall Restrictions Weight Bearing Restrictions: No       Mobility Bed Mobility Overal bed mobility: Needs Assistance Bed Mobility: Supine to Sit     Supine to sit: Mod assist Sit to supine: Max assist      Transfers            NT          Balance Overall balance assessment: Needs assistance Sitting-balance support: Bilateral upper extremity supported;Feet supported Sitting balance-Leahy Scale: Fair       Standing balance-Leahy Scale: Poor                             ADL either performed or assessed with clinical judgement   ADL Overall ADL's : Needs assistance/impaired Eating/Feeding: Minimal assistance;Sitting   Grooming: Sitting;Moderate assistance                                 General ADL Comments: pt very weak this day- but did agree to eating her grits and drinking some milk. Session limited due to fatigue     Vision Patient Visual Report: No change from baseline            Cognition Arousal/Alertness: Awake/alert Behavior During Therapy: WFL for tasks assessed/performed Overall Cognitive Status: No family/caregiver present to determine baseline cognitive functioning                                                      Pertinent Vitals/ Pain       Pain Assessment: No/denies pain     Prior Functioning/Environment              Frequency  Min 2X/week        Progress Toward Goals  OT Goals(current goals can now be found in the care plan section)  Progress towards OT goals: Progressing toward goals     Plan Discharge plan remains appropriate    Co-evaluation                 AM-PAC PT "6 Clicks" Daily Activity     Outcome Measure   Help from another person eating meals?: A Little Help from another person taking care of personal grooming?: A Little Help from another person toileting, which includes using toliet, bedpan, or urinal?: A Lot Help from another person bathing (including washing, rinsing, drying)?: Total Help from another person to put on and taking off regular upper body clothing?: A Little Help from another person to put on and taking off regular lower body clothing?: Total 6 Click Score: 13  End of Session Equipment Utilized During Treatment: Gait belt;Rolling walker  OT Visit Diagnosis: Unsteadiness on feet (R26.81);Muscle weakness (generalized) (M62.81)   Activity Tolerance Patient limited by fatigue   Patient Left with call bell/phone within reach;in bed;with bed alarm set   Nurse Communication Mobility status        Time: 1610-9604 OT Time Calculation (min): 23 min  Charges: OT General Charges $OT Visit: 1 Visit OT Treatments $Self Care/Home Management : 23-37 mins  [name], [place] [phone] Office- [phone]      [name], [name] 11/21/2017, 12:41 PM

## 2017-11-21 NOTE — NC FL2 (Signed)
[place] LEVEL OF CARE SCREENING TOOL     IDENTIFICATION  Patient Name: [name] Birthdate: 02-01-1927 Sex: female Admission Date (Current Location): 11/04/2017  [facility] and [place] Number:  Herbalist and Address:  [facility],  [place], [place]      Provider Number: [id number]  Attending Physician Name and Address:  Patrecia Pour, Christean Grief, MD  Relative Name and Phone Number:       Current Level of Care: Hospital Recommended Level of Care: [place] Prior Approval Number:    Date Approved/Denied:   PASRR Number: [id number]  Discharge Plan: SNF    Current Diagnoses: Patient Active Problem List   Diagnosis Date Noted  . Abnormal loss of weight   . Abnormal finding on GI tract imaging   . Nausea and vomiting   . Dysphagia   . Bilious vomiting with nausea   . Enlarged lymph node   . Pressure injury of skin 11/05/2017  . Dehydration 11/04/2017  . Abdominal pain 11/04/2017  . Quadriceps strain, left, subsequent encounter 05/02/2017  . Rectal bleed 10/13/2016  . Malnutrition of moderate degree 10/13/2016  . Chest pain with moderate risk for cardiac etiology 10/06/2016  . Labile blood pressure   . Stage 3 chronic kidney disease ([place])   . AKI (acute kidney injury) ([place])   . Acute blood loss anemia   . Small vessel disease ([place]) 09/22/2016  . Hemiparesis affecting left side as late effect of cerebrovascular accident (CVA) ([place]) 09/22/2016  . Gait disturbance, post-stroke 09/22/2016  . Stroke (cerebrum) ([place]) 09/20/2016  . Diabetes mellitus with complication ([place])   . Ischemic stroke ([place])   . Status post intraocular lens implant 06/23/2011  . Glaucoma suspect 12/30/2010  . Macular hole 12/30/2010  . Nonproliferative diabetic retinopathy ([place]) 12/30/2010  . Syncope 06/17/2010  . CARDIOMYOPATHY 04/15/2010  . PLEURAL EFFUSION 04/14/2010  . SHORTNESS OF BREATH 04/14/2010  . CORONARY  ATHEROSCLEROSIS NATIVE CORONARY ARTERY 01/19/2009  . Hyperlipidemia 01/06/2009  . Diabetes mellitus ([place]) 01/05/2009  . Essential hypertension 01/05/2009  . Coronary atherosclerosis 01/05/2009    Orientation RESPIRATION BLADDER Height & Weight     Self, Time, Situation, Place  Normal Incontinent Weight: 128 lb 1.6 oz (58.1 kg) Height:  5\' 3"  (160 cm)  BEHAVIORAL SYMPTOMS/MOOD NEUROLOGICAL BOWEL NUTRITION STATUS      Incontinent Diet(see dc summary)  AMBULATORY STATUS COMMUNICATION OF NEEDS Skin   Extensive Assist Verbally PU Stage and Appropriate Care   PU Stage 2 Dressing: ( PressureInjury10/05/19StageII-Partialthicknesslossofdermispresentingasashallowopenulcerwithared,pinkwoundbedwithoutslough. Location: Sacrum Location Orientation: Medial Foam Dressing changed PRN)                   Personal Care Assistance Level of Assistance  Bathing, Feeding, Dressing Bathing Assistance: Maximum assistance Feeding assistance: Independent Dressing Assistance: Maximum assistance     Functional Limitations Info  Sight, Hearing, Speech Sight Info: Adequate Hearing Info: Adequate Speech Info: Adequate    SPECIAL CARE FACTORS FREQUENCY  PT (By licensed PT), OT (By licensed OT)     PT Frequency: 5x/week OT Frequency: 5x/week            Contractures Contractures Info: Not present    Additional Factors Info  Code Status, Allergies Code Status Info: Full Code Allergies Info: Contrast Media Iodinated Diagnostic Agents           Current Medications (11/21/2017):  This is the current hospital active medication list Current Facility-Administered Medications  Medication Dose Route Frequency Provider Last  Rate Last Dose  . 0.45 % sodium chloride infusion   Intravenous Continuous Nelida Meuse III, MD 50 mL/hr at 11/21/17 1000    . acetaminophen (TYLENOL) tablet 650 mg  650 mg Oral Q6H PRN Nelida Meuse III, MD   650 mg at 11/20/17 2029   Or  .  acetaminophen (TYLENOL) suppository 650 mg  650 mg Rectal Q6H PRN Nelida Meuse III, MD      . alum & mag hydroxide-simeth (MAALOX/MYLANTA) 200-200-20 MG/5ML suspension 30 mL  30 mL Oral Q4H PRN Nelida Meuse III, MD   30 mL at 11/08/17 2113  . amLODipine (NORVASC) tablet 5 mg  5 mg Oral Daily Nelida Meuse III, MD   5 mg at 11/21/17 0938  . atorvastatin (LIPITOR) tablet 40 mg  40 mg Oral Daily Nelida Meuse III, MD   40 mg at 11/21/17 0938  . cloNIDine (CATAPRES) tablet 0.1 mg  0.1 mg Oral BID Nelida Meuse III, MD   0.1 mg at 11/21/17 0938  . feeding supplement (ENSURE ENLIVE) (ENSURE ENLIVE) liquid 237 mL  237 mL Oral BID BM Nelida Meuse III, MD   237 mL at 11/21/17 0938  . ferumoxytol (FERAHEME) 510 mg in sodium chloride 0.9 % 100 mL IVPB  510 mg Intravenous Weekly Nelida Meuse III, MD 468 mL/hr at 11/16/17 1010 510 mg at 11/16/17 1010  . HYDROcodone-acetaminophen (NORCO/VICODIN) 5-325 MG per tablet 1-2 tablet  1-2 tablet Oral Q4H PRN Nelida Meuse III, MD      . insulin aspart (novoLOG) injection 0-9 Units  0-9 Units Subcutaneous TID WC Patrecia Pour, Christean Grief, MD   1 Units at 11/21/17 1222  . isosorbide mononitrate (IMDUR) 24 hr tablet 30 mg  30 mg Oral Daily Nelida Meuse III, MD   30 mg at 11/21/17 2595  . levalbuterol (XOPENEX) nebulizer solution 1.25 mg  1.25 mg Nebulization Q6H PRN Nelida Meuse III, MD   1.25 mg at 11/09/17 0024  . metoprolol tartrate (LOPRESSOR) tablet 50 mg  50 mg Oral BID Nelida Meuse III, MD   50 mg at 11/21/17 6387  . morphine 2 MG/ML injection 1-2 mg  1-2 mg Intravenous Q4H PRN Nelida Meuse III, MD      . nitroGLYCERIN (NITROSTAT) SL tablet 0.4 mg  0.4 mg Sublingual Q5 min PRN Nelida Meuse III, MD   0.4 mg at 11/10/17 2129  . ondansetron (ZOFRAN) tablet 4 mg  4 mg Oral Q6H PRN Nelida Meuse III, MD       Or  . ondansetron (ZOFRAN) injection 4 mg  4 mg Intravenous Q6H PRN Nelida Meuse III, MD   4 mg at 11/21/17 1025  . pantoprazole (PROTONIX) EC  tablet 40 mg  40 mg Oral BID Nelida Meuse III, MD   40 mg at 11/21/17 5643  . polyethylene glycol (MIRALAX / GLYCOLAX) packet 17 g  17 g Oral Daily Nelida Meuse III, MD   17 g at 11/21/17 3295  . senna-docusate (Senokot-S) tablet 1 tablet  1 tablet Oral BID Doran Stabler, MD   1 tablet at 11/21/17 1884     Discharge Medications: Please see discharge summary for a list of discharge medications.  Relevant Imaging Results:  Relevant Lab Results:   Additional Information SSN  [id number]  Burnis Medin, LCSW

## 2017-11-22 ENCOUNTER — Ambulatory Visit: Payer: Medicare Other | Admitting: Adult Health

## 2017-11-22 ENCOUNTER — Encounter: Payer: Self-pay | Admitting: Nurse Practitioner

## 2017-11-22 ENCOUNTER — Encounter (HOSPITAL_COMMUNITY): Payer: Self-pay | Admitting: Gastroenterology

## 2017-11-22 DIAGNOSIS — R933 Abnormal findings on diagnostic imaging of other parts of digestive tract: Secondary | ICD-10-CM

## 2017-11-22 DIAGNOSIS — E440 Moderate protein-calorie malnutrition: Secondary | ICD-10-CM

## 2017-11-22 DIAGNOSIS — R112 Nausea with vomiting, unspecified: Secondary | ICD-10-CM

## 2017-11-22 LAB — BASIC METABOLIC PANEL
Anion gap: 12 (ref 5–15)
BUN: 12 mg/dL (ref 8–23)
CHLORIDE: 99 mmol/L (ref 98–111)
CO2: 24 mmol/L (ref 22–32)
CREATININE: 1.16 mg/dL — AB (ref 0.44–1.00)
Calcium: 9.1 mg/dL (ref 8.9–10.3)
GFR calc Af Amer: 47 mL/min — ABNORMAL LOW (ref >60)
GFR calc non Af Amer: 41 mL/min — ABNORMAL LOW (ref >60)
Glucose, Bld: 121 mg/dL — ABNORMAL HIGH (ref 70–99)
Potassium: 3.5 mmol/L (ref 3.5–5.1)
SODIUM: 135 mmol/L (ref 135–145)

## 2017-11-22 LAB — CBC WITH DIFFERENTIAL/PLATELET
Abs Immature Granulocytes: 0.05 10*3/uL (ref 0.00–0.07)
Basophils Absolute: 0.1 10*3/uL (ref 0.0–0.1)
Basophils Relative: 1 %
EOS PCT: 1 %
Eosinophils Absolute: 0.1 10*3/uL (ref 0.0–0.5)
HEMATOCRIT: 24.2 % — AB (ref 36.0–46.0)
HEMOGLOBIN: 7.3 g/dL — AB (ref 12.0–15.0)
Immature Granulocytes: 1 %
LYMPHS PCT: 10 %
Lymphs Abs: 0.8 10*3/uL (ref 0.7–4.0)
MCH: 27 pg (ref 26.0–34.0)
MCHC: 30.2 g/dL (ref 30.0–36.0)
MCV: 89.6 fL (ref 80.0–100.0)
Monocytes Absolute: 0.9 10*3/uL (ref 0.1–1.0)
Monocytes Relative: 11 %
NEUTROS ABS: 6 10*3/uL (ref 1.7–7.7)
Neutrophils Relative %: 76 %
PLATELETS: 320 10*3/uL (ref 150–400)
RBC: 2.7 MIL/uL — AB (ref 3.87–5.11)
RDW: 16.9 % — AB (ref 11.5–15.5)
WBC: 7.8 10*3/uL (ref 4.0–10.5)
nRBC: 0 % (ref 0.0–0.2)

## 2017-11-22 LAB — GLUCOSE, CAPILLARY
GLUCOSE-CAPILLARY: 135 mg/dL — AB (ref 70–99)
Glucose-Capillary: 123 mg/dL — ABNORMAL HIGH (ref 70–99)
Glucose-Capillary: 142 mg/dL — ABNORMAL HIGH (ref 70–99)
Glucose-Capillary: 111 mg/dL — ABNORMAL HIGH (ref 70–99)

## 2017-11-22 MED ORDER — MEGESTROL ACETATE 400 MG/10ML PO SUSP
400.0000 mg | Freq: Every day | ORAL | Status: DC
  Administered 2017-11-22 – 2017-11-24 (×3): 400 mg via ORAL
  Filled 2017-11-22 (×5): qty 10

## 2017-11-22 MED ORDER — CLONIDINE HCL 0.1 MG PO TABS
0.1000 mg | ORAL_TABLET | Freq: Every day | ORAL | Status: DC
  Administered 2017-11-23 (×2): 0.1 mg via ORAL
  Filled 2017-11-22 (×2): qty 1

## 2017-11-22 MED ORDER — MEGESTROL ACETATE 40 MG PO TABS: 40.0000 mg | ORAL_TABLET | Freq: Every day | ORAL | Status: DC

## 2017-11-22 MED ORDER — HYDRALAZINE HCL 20 MG/ML IJ SOLN: 10.0000 mg | Freq: Four times a day (QID) | INTRAMUSCULAR | Status: DC | PRN

## 2017-11-22 NOTE — Progress Notes (Signed)
     [facility] Progress Note   Chief Complaint:   Swallowing problems    SUBJECTIVE:   Says swallowing has continued to improve. Monday she had episode of N/V during PT but none since     ASSESSMENT AND PLAN:   1. Achalasia, s/p EGD with empiric Botox injection of LES two days ago. Currently on full liquid diet. She is not opposed to advancing diet from swallowing standpoint but says her appetite is lacking.  -will try chopped/ dysphagia diet and see how she does. Continue nutritional supplements  2. Left adnexal cystic lesion on CT scan. Apparently family doesn't want to pursue workup.  3. Chronic [place] anemia. Hgb stable in low 7 range   OBJECTIVE:     Vital signs in last 24 hours: Temp:  [98.2 F (36.8 C)-99.8 F (37.7 C)] 98.2 F (36.8 C) (10/23 0452) Pulse Rate:  [85-95] 85 (10/23 0452) Resp:  [19-20] 19 (10/23 0452) BP: (132-155)/(63-73) 147/63 (10/23 0452) SpO2:  [99 %-100 %] 100 % (10/23 0452) Weight:  [58.1 kg] 58.1 kg (10/22 1308) Last BM Date: 11/20/17 General:   Alert female lying in NAD EENT:  Normal hearing, non icteric sclera, conjunctive pink.  Heart:  Regular rate and rhythm, lower extremity edema Pulm: Normal respiratory effort Abdomen:  Soft, nondistended, nontender.  Normal bowel sounds \\Neurologic :  Alert and  oriented x4;  grossly normal neurologically. Psych:  Pleasant, cooperative.  Normal mood and affect.   Intake/Output from previous day: 10/22 0701 - 10/23 0700 In: 430.8 [I.V.:430.8] Out: -  Intake/Output this shift: No intake/output data recorded.  Lab Results: Recent Labs    11/20/17 0550 11/21/17 0601 11/22/17 0626  WBC 8.4 9.9 7.8  HGB 7.4* 8.8* 7.3*  HCT 23.9* 29.2* 24.2*  PLT 400 411* 320   BMET   Active Problems:   Hyperlipidemia   Essential hypertension   CORONARY ATHEROSCLEROSIS NATIVE CORONARY ARTERY   Diabetes mellitus with complication (HCC)   Hemiparesis affecting left side as late effect of  cerebrovascular accident (CVA) ([place])   Stage 3 chronic kidney disease (HCC)   Malnutrition of moderate degree   Dehydration   Abdominal pain   Pressure injury of skin   Bilious vomiting with nausea   Enlarged lymph node   Dysphagia   Nausea and vomiting   Abnormal loss of weight   Abnormal finding on GI tract imaging     LOS: 15 days   Tye Savoy ,NP 11/22/2017, 9:03 AM

## 2017-11-22 NOTE — Progress Notes (Signed)
Hospital follow up with me on 12/06/17 at 11am

## 2017-11-22 NOTE — Progress Notes (Addendum)
PROGRESS NOTE [facility]   [name]   URK:[id number] DOB: [birth year]  DOA: 11/04/2017 PCP: Deland Pretty, MD   Brief Narrative:   84 year old diabetes mellitus type 2,  CVA with left side residual weakness,  CAD CABG 2011, hypertension, CKD III, 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 Chronic stage II sacral ulcer Umbilical lymph node nodule not biopsied after discussion with family combined systolic and diastolic CHF   Admitted 83/1  w VOl depletion nausea and vomiting for 1 week prior to admission, associated with unintentional weight loss-Recent 50 pound weight loss GI was consulted-rapid response appeared to have occurred--had chest pain cardiology discussed with her and she was disinclined for aggressive management of this Underwent EGD 10/21 suggestive of achalasia Botox was done to LES and patient's diet was graduated  Subjective:  Awake pleasant alert somewhat scared of eating Willing however after discussion with team to try chopped diet  Assessment & Plan: Dysphagia - Achalasia CT abdomen/pelvis body esophagram is relative really unremarkable except large hiatal hernia. 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   Status post EGD 10/21 showed findings consistent with achalasia.  Patient was treated with botulism toxin.  Per GI advance diet to dysphagia 2 Watch tolerance of the same and probable discharge a.m. unless other thoughts per Dr. Havery Moros  Anemia of chronic disease Felt to be multifactorial due to malnutrition and CKD. ? GI bleed in setting of Plavix  Due to poor oral intake, patient receiving IV iron Feraheme weekly hemoglobin down from baseline.  No  overt bleeding- Holding Plavix.  Monitor CBC and transfuse if hemoglobin less than 7.  Resume Plavix if no occult bleed in a.m.-we will discuss with son  CAD, elevated troponin and sinus tachycardia. - remains stable    Episodic chest pain this admission-disinclined for intervention-cardiology recommended to continue medical treatment with statin, metoprolol, isosorbide mononitrate and Plavix [when we can see hemoglobin ]echocardiogram shows diffuse hypokinesis, EF of 40 to 50% and grade 1 diastolic dysfunction.  Stable at this time without chest pain  Abnormal chest CT CT image showed groundglass opacities in the right lung, pulmonary was consulted and felt to be secondary to aspiration pneumonitis.  Recommend outpatient follow-up and repeat CT scan in 2 to 3 months.  Diabetes mellitus type 2 Glipizide 10 every morning on hold at this time Sugars 120's on SSI Resume meds on d/c  Essential hypertension Blood pressure stable, continue amlodipine Lopressor and Imdur.  CKD stage III - stable Renal function improved this admission with a baseline in the 1.3's around the time of admission to currently 1.1  Chronic diastolic and systolic heart failure Continues to be euvolemic.  Continue Imdur 30, metoprolol 50 twice daily, amlodipine 5 On no diuretics at this time  Stage II sacral ulcer-POA Continue local wound care did not visualize today  Periumbilical adenopathy. Mild elevated CA 19 9 and elevated Ca1 25. Patient and family decided not to further investigate.  Severe malnutrition Due to dysphagia. Expect to improve if dysphagia improved Continue laxatives MiraLAX and senna  DVT prophylaxis: SCDs Code Status: Full code Family Communication: d/w son Disposition Plan: SNF in a.m. if tolerating diet well  Consultants:   GI, cardiology, pulmonology  Procedures:   None  Antimicrobials:  None   Objective: Vitals:   11/21/17 1308 11/21/17 1343 11/21/17 2136 11/22/17 0452  BP:  132/64 (!) 155/73 (!) 147/63  Pulse:  87 95 85  Resp:   20  19  Temp:  98.3 F (36.8 C) 99.8 F (37.7 C) 98.2 F (36.8 C)  TempSrc:  Oral Oral Oral  SpO2:  99% 99% 100%  Weight: 58.1 kg     Height:         Intake/Output Summary (Last 24 hours) at 11/22/2017 0947 Last data filed at 11/21/2017 1600 Gross per 24 hour  Intake 430.8 ml  Output -  Net 430.8 ml   Filed Weights   11/20/17 0427 11/20/17 1147 11/21/17 1308  Weight: 58 kg 58 kg 58.1 kg    Examination:  Awake alert bitemporal wasting Chest is clear Abdomen is soft with no rebound no guarding Chest is clinically clear without added sound Has a midline chest scar no rash line I did not visualize the wound today  Data Reviewed: I have personally reviewed following labs and imaging studies  CBC: Recent Labs  Lab 11/17/17 0545 11/18/17 0610 11/19/17 0858 11/20/17 0550 11/21/17 0601 11/22/17 0626  WBC 8.8 8.8 8.6 8.4 9.9 7.8  NEUTROABS 6.8 6.7 6.7  --  7.8* 6.0  HGB 7.7* 7.5* 7.8*  8.0* 7.4* 8.8* 7.3*  HCT 25.7* 25.2* 26.1* 23.9* 29.2* 24.2*  MCV 89.2 89.0 88.2 88.8 89.6 89.6  PLT 431* 433* 464* 400 411* 195   Basic Metabolic Panel: Recent Labs  Lab 11/16/17 0606 11/17/17 0545 11/18/17 0610 11/19/17 0858 11/20/17 0550 11/22/17 0626  NA 135 135 134* 134* 134* 135  K 3.9 4.4 3.4* 3.5 3.6 3.5  CL 100 101 100 98 101 99  CO2 24 24 23  21* 21* 24  GLUCOSE 113* 127* 117* 147* 120* 121*  BUN 15 14 12 11 11 12   CREATININE 1.37* 1.34* 1.25* 1.32* 1.24* 1.16*  CALCIUM 9.4 9.5 9.4 9.8 9.3 9.1  MG  --  1.7  --   --   --   --   PHOS 2.4*  --   --   --   --   --    GFR: Estimated Creatinine Clearance: 27.7 mL/min (A) (by C-G formula based on SCr of 1.16 mg/dL (H)). Liver Function Tests: Recent Labs  Lab 11/16/17 0606  ALBUMIN 2.1*   No results for input(s): LIPASE, AMYLASE in the last 168 hours. No results for input(s): AMMONIA in the last 168 hours. Coagulation Profile: No results for input(s): INR, PROTIME in the last 168 hours. Cardiac Enzymes: No results for input(s): CKTOTAL, CKMB, CKMBINDEX, TROPONINI in the last 168 hours. BNP (last 3 results) No results for input(s): PROBNP in the last 8760  hours. HbA1C: No results for input(s): HGBA1C in the last 72 hours. CBG: Recent Labs  Lab 11/21/17 0749 11/21/17 1158 11/21/17 1557 11/21/17 2134 11/22/17 0730  GLUCAP 124* 130* 132* 114* 123*   Lipid Profile: No results for input(s): CHOL, HDL, LDLCALC, TRIG, CHOLHDL, LDLDIRECT in the last 72 hours. Thyroid Function Tests: No results for input(s): TSH, T4TOTAL, FREET4, T3FREE, THYROIDAB in the last 72 hours. Anemia Panel: No results for input(s): VITAMINB12, FOLATE, FERRITIN, TIBC, IRON, RETICCTPCT in the last 72 hours. Sepsis Labs: No results for input(s): PROCALCITON, LATICACIDVEN in the last 168 hours.  No results found for this or any previous visit (from the past 240 hour(s)).    Radiology Studies: No results found.   Scheduled Meds: . amLODipine  5 mg Oral Daily  . atorvastatin  40 mg Oral Daily  . cloNIDine  0.1 mg Oral BID  . feeding supplement (ENSURE ENLIVE)  237 mL Oral BID BM  . insulin  aspart  0-9 Units Subcutaneous TID WC  . isosorbide mononitrate  30 mg Oral Daily  . metoprolol tartrate  50 mg Oral BID  . pantoprazole  40 mg Oral BID  . polyethylene glycol  17 g Oral Daily  . senna-docusate  1 tablet Oral BID   Continuous Infusions: . sodium chloride 50 mL/hr at 11/22/17 0303  . ferumoxytol 510 mg (11/16/17 1010)     LOS: 15 days   Time spent: Total of 15 minutes spent with pt, greater than 50% of which was spent in discussion of  treatment, counseling and coordination of care  Nita Sells, MD Pager: Text Page via [email or web address]   If 7PM-7AM, please contact night-coverage [email or web address] 11/22/2017, 9:47 AM

## 2017-11-22 NOTE — Progress Notes (Signed)
Long discussion with Dr. Edwin Dada son] Patient is refusing PO and seems scared to eat I have cut back the diet after Nurse told me she wasn't tolerating the diet I have asked Chaplain to come in and talk to the patient and see if things can improve

## 2017-11-22 NOTE — Progress Notes (Signed)
Occupational Therapy Treatment Patient Details Name: [name] MRN: [id number] DOB: 1927-10-01 Today's Date: 11/22/2017    History of present illness Adria Costley is a 84 y.o. female with medical history significant of DM 2, CVA left-sided weakness, history of CAD,  hypertension, hyperlipidemia, CKD 3, combined systolic diastolic CHF; admitted with dehydration, wt loss, dysphagia, EGD 11/14/17 scheduled   OT comments  Plan is now SNF  Follow Up Recommendations  SNF    Equipment Recommendations  None recommended by OT    Recommendations for Other Services      Precautions / Restrictions Precautions Precautions: Fall       Mobility Bed Mobility Overal bed mobility: Needs Assistance Bed Mobility: Supine to Sit;Sit to Supine     Supine to sit: Mod assist;Max assist Sit to supine: Mod assist      Transfers                      Balance Overall balance assessment: Needs assistance Sitting-balance support: Bilateral upper extremity supported;Feet supported Sitting balance-Leahy Scale: Poor Sitting balance - Comments: sitting EOB     Standing balance-Leahy Scale: Poor                             ADL either performed or assessed with clinical judgement   ADL Overall ADL's : Needs assistance/impaired Eating/Feeding: Moderate assistance;Sitting Eating/Feeding Details (indicate cue type and reason): sitting EOB drinking milk.  sitting balance mod A Grooming: Sitting;Moderate assistance;Wash/dry face                                 General ADL Comments: pt did agree to sit EOB this day.  Pt very weak and needed mod A for sitting balance. Pt sat for approx 5 min before needing to rest     Vision Patient Visual Report: No change from baseline            Cognition Arousal/Alertness: Awake/alert Behavior During Therapy: WFL for tasks assessed/performed Overall Cognitive Status: Within Functional Limits for tasks assessed                                  General Comments: feels "bad" and "tired"                    Pertinent Vitals/ Pain       Pain Assessment: No/denies pain  Home Living                                          Prior Functioning/Environment              Frequency  Min 2X/week        Progress Toward Goals  OT Goals(current goals can now be found in the care plan section)   not progressing- will reassess     Plan Discharge plan remains appropriate       AM-PAC PT "6 Clicks" Daily Activity     Outcome Measure   Help from another person eating meals?: A Little Help from another person taking care of personal grooming?: A Little Help from another person toileting, which includes using toliet, bedpan, or urinal?: A Lot Help from another person bathing (  including washing, rinsing, drying)?: Total Help from another person to put on and taking off regular upper body clothing?: A Lot Help from another person to put on and taking off regular lower body clothing?: Total 6 Click Score: 12    End of Session Equipment Utilized During Treatment: Gait belt;Rolling walker  OT Visit Diagnosis: Unsteadiness on feet (R26.81);Muscle weakness (generalized) (M62.81)   Activity Tolerance Patient limited by fatigue   Patient Left with call bell/phone within reach;in bed;with bed alarm set   Nurse Communication Mobility status        Time: 9381-8299 OT Time Calculation (min): 18 min  Charges: OT General Charges $OT Visit: 1 Visit OT Treatments $Self Care/Home Management : 8-22 mins  Kari Baars, [place] [phone] Office- [phone], [name] 11/22/2017, 5:36 PM

## 2017-11-23 LAB — BASIC METABOLIC PANEL
Anion gap: 12 (ref 5–15)
BUN: 11 mg/dL (ref 8–23)
CHLORIDE: 99 mmol/L (ref 98–111)
CO2: 23 mmol/L (ref 22–32)
Calcium: 9.4 mg/dL (ref 8.9–10.3)
Creatinine, Ser: 1.24 mg/dL — ABNORMAL HIGH (ref 0.44–1.00)
GFR calc Af Amer: 44 mL/min — ABNORMAL LOW (ref >60)
GFR calc non Af Amer: 38 mL/min — ABNORMAL LOW (ref >60)
Glucose, Bld: 141 mg/dL — ABNORMAL HIGH (ref 70–99)
POTASSIUM: 3.5 mmol/L (ref 3.5–5.1)
SODIUM: 134 mmol/L — AB (ref 135–145)

## 2017-11-23 LAB — CBC WITH DIFFERENTIAL/PLATELET
ABS IMMATURE GRANULOCYTES: 0.02 10*3/uL (ref 0.00–0.07)
Basophils Absolute: 0 10*3/uL (ref 0.0–0.1)
Basophils Relative: 0 %
Eosinophils Absolute: 0.1 10*3/uL (ref 0.0–0.5)
Eosinophils Relative: 1 %
HCT: 26.8 % — ABNORMAL LOW (ref 36.0–46.0)
Hemoglobin: 8.2 g/dL — ABNORMAL LOW (ref 12.0–15.0)
Immature Granulocytes: 0 %
LYMPHS ABS: 0.7 10*3/uL (ref 0.7–4.0)
Lymphocytes Relative: 9 %
MCH: 27.1 pg (ref 26.0–34.0)
MCHC: 30.6 g/dL (ref 30.0–36.0)
MCV: 88.4 fL (ref 80.0–100.0)
MONO ABS: 0.9 10*3/uL (ref 0.1–1.0)
MONOS PCT: 11 %
NRBC: 0 % (ref 0.0–0.2)
Neutro Abs: 6.1 10*3/uL (ref 1.7–7.7)
Neutrophils Relative %: 79 %
Platelets: 359 10*3/uL (ref 150–400)
RBC: 3.03 MIL/uL — ABNORMAL LOW (ref 3.87–5.11)
RDW: 16.8 % — ABNORMAL HIGH (ref 11.5–15.5)
WBC: 7.8 10*3/uL (ref 4.0–10.5)

## 2017-11-23 LAB — GLUCOSE, CAPILLARY
GLUCOSE-CAPILLARY: 134 mg/dL — AB (ref 70–99)
GLUCOSE-CAPILLARY: 145 mg/dL — AB (ref 70–99)
Glucose-Capillary: 199 mg/dL — ABNORMAL HIGH (ref 70–99)
Glucose-Capillary: 157 mg/dL — ABNORMAL HIGH (ref 70–99)

## 2017-11-23 MED ORDER — CLOPIDOGREL BISULFATE 75 MG PO TABS
75.0000 mg | ORAL_TABLET | Freq: Every day | ORAL | Status: DC
  Administered 2017-11-23: 75 mg via ORAL
  Filled 2017-11-23: qty 1

## 2017-11-23 MED ORDER — HYDRALAZINE HCL 20 MG/ML IJ SOLN: 10.0000 mg | Freq: Four times a day (QID) | INTRAMUSCULAR | Status: DC | PRN

## 2017-11-23 MED ORDER — METOPROLOL TARTRATE 50 MG PO TABS
75.0000 mg | ORAL_TABLET | Freq: Two times a day (BID) | ORAL | Status: DC
  Administered 2017-11-23 – 2017-11-24 (×2): 75 mg via ORAL
  Filled 2017-11-23 (×2): qty 1

## 2017-11-23 NOTE — Telephone Encounter (Signed)
Patient remains in the hospital at this time.

## 2017-11-23 NOTE — Progress Notes (Signed)
PROGRESS NOTE [facility]   [name]   JQZ:[id number] DOB: 04-15-1927  DOA: 11/04/2017 PCP: Deland Pretty, MD   Brief Narrative:   84 year old diabetes mellitus type 2,  CVA with left side residual weakness,  CAD CABG 2011, hypertension, CKD III, 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 Chronic stage II sacral ulcer Umbilical lymph node nodule not biopsied after discussion with family combined systolic and diastolic CHF    Admitted 62/2  w VOl depletion nausea and vomiting for 1 week prior to admission, associated with unintentional weight loss-Recent 50 pound weight loss GI was consulted-rapid response appeared to have occurred--had chest pain cardiology discussed with her and she was disinclined for aggressive management of this Underwent EGD 10/21 suggestive of achalasia Botox was done to LES and patient's diet was graduated  Subjective:  Ate about 20% of diet this morning and admitted to trying to eat but is a little scared to "I just do not feel hungry" Not willing to do an NG tube or PEG tube but does want to live and we explored this and discuss further that they are both synonymous and both need to occur for her to continue living in terms of eating well  Assessment & Plan: Dysphagia - Achalasia CT abdomen/pelvis body esophagram is relative really unremarkable except large hiatal hernia. 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   Status post EGD 10/21 showed findings consistent with achalasia.  Patient was treated with botulism toxin.  Per GI advance diet to dysphagia 2 but she is not eating well and is only taking and 10 to 20% of the diet--she was downgraded from a dysphagia 3 to dysphagia 2 diet because she was not able to eat the chopped meats  Anemia of chronic disease Felt to be multifactorial due to malnutrition and CKD. ? GI bleed in setting of Plavix  Due to poor oral intake,  patient receiving IV iron Feraheme weekly hemoglobin down from baseline.  No  overt bleeding-resumed Plavix on 10/24 as hemoglobin 7-8 range without drop  CAD, elevated troponin and sinus tachycardia. - remains stable  Episodic chest pain this admission-disinclined for intervention-cardiology recommended to continue medical treatment with statin, metoprolol, isosorbide mononitrate and Plavix  Stable at this time without chest pain  Abnormal chest CT CT image showed groundglass opacities in the right lung, pulmonary was consulted and felt to be secondary to aspiration pneumonitis.  Recommend outpatient follow-up and repeat CT scan in 2 to 3 months.  Diabetes mellitus type 2 Glipizide 10 every morning on hold at this time Sugars in the 130 range SSI Resume meds on d/c  Essential hypertension Blood pressure elevated probably because of withdrawal of clonidine and amlodipine-we will titrate Lopressor slightly today and continue, continue  Imdur.  CKD stage III - stable Renal function improved this admission with a baseline in the 1.3's around the time of admission to currently 1.1   Chronic diastolic and systolic heart failure Continues to be euvolemic.  Continue Imdur 30, metoprolol 50 twice daily, amlodipine 5 On no diuretics at this time  Stage II sacral ulcer-POA Continue local wound care did not visualize today  Periumbilical adenopathy.Patient and family decided not to further investigate. Mild elevated CA 19 9 and elevated Ca1 25.  Severe malnutrition Due to dysphagia. Expect to improve if dysphagia improved Continue laxatives MiraLAX and senna I added Megace at the request of her son who is a physician 400 mg and adjusted some  medications including amlodipine which I discontinued 10/23 thus this can slow gastric motility We addressed PEG feeding this morning 10/24 and patient is completely unwilling to do this-I think patient will need a couple more days in the hospital to see  what her diet does and if she is not getting adequate nutrition we may need to discuss further with her regarding therapeutic versus palliative goals   DVT prophylaxis: SCDs Code Status: Full code Family Communication: d/w son Disposition Plan: SNF in a.m. if tolerating diet well  Consultants:   GI, cardiology, pulmonology  Procedures:   None  Antimicrobials:  None   Objective: Vitals:   11/22/17 2133 11/23/17 0518 11/23/17 1010 11/23/17 1014  BP: (!) 158/75 (!) 175/69 (!) 157/77 (!) 157/77  Pulse: 92 93  98  Resp:  19    Temp:  98.8 F (37.1 C)    TempSrc:      SpO2:  100%    Weight:  53.6 kg    Height:        Intake/Output Summary (Last 24 hours) at 11/23/2017 1055 Last data filed at 11/23/2017 0957 Gross per 24 hour  Intake 2376.75 ml  Output 800 ml  Net 1576.75 ml   Filed Weights   11/20/17 1147 11/21/17 1308 11/23/17 0518  Weight: 58 kg 58.1 kg 53.6 kg    Examination:  Awake oriented no distress EOMI NCAT Abdomen soft nontender no rebound no guarding chest is clear S1-S2 no murmur Lower extremities is soft nontender Neurologically intact alert coherent and verbalizes well with no confusion Skin wound on sacrum not visualized today   Data Reviewed: I have personally reviewed following labs and imaging studies  CBC: Recent Labs  Lab 11/18/17 0610 11/19/17 0858 11/20/17 0550 11/21/17 0601 11/22/17 0626 11/23/17 0558  WBC 8.8 8.6 8.4 9.9 7.8 7.8  NEUTROABS 6.7 6.7  --  7.8* 6.0 6.1  HGB 7.5* 7.8*  8.0* 7.4* 8.8* 7.3* 8.2*  HCT 25.2* 26.1* 23.9* 29.2* 24.2* 26.8*  MCV 89.0 88.2 88.8 89.6 89.6 88.4  PLT 433* 464* 400 411* 320 202   Basic Metabolic Panel: Recent Labs  Lab 11/17/17 0545 11/18/17 0610 11/19/17 0858 11/20/17 0550 11/22/17 0626 11/23/17 0558  NA 135 134* 134* 134* 135 134*  K 4.4 3.4* 3.5 3.6 3.5 3.5  CL 101 100 98 101 99 99  CO2 24 23 21* 21* 24 23  GLUCOSE 127* 117* 147* 120* 121* 141*  BUN 14 12 11 11 12 11     CREATININE 1.34* 1.25* 1.32* 1.24* 1.16* 1.24*  CALCIUM 9.5 9.4 9.8 9.3 9.1 9.4  MG 1.7  --   --   --   --   --    GFR: Estimated Creatinine Clearance: 25.9 mL/min (A) (by C-G formula based on SCr of 1.24 mg/dL (H)). Liver Function Tests: No results for input(s): AST, ALT, ALKPHOS, BILITOT, PROT, ALBUMIN in the last 168 hours. No results for input(s): LIPASE, AMYLASE in the last 168 hours. No results for input(s): AMMONIA in the last 168 hours. Coagulation Profile: No results for input(s): INR, PROTIME in the last 168 hours. Cardiac Enzymes: No results for input(s): CKTOTAL, CKMB, CKMBINDEX, TROPONINI in the last 168 hours. BNP (last 3 results) No results for input(s): PROBNP in the last 8760 hours. HbA1C: No results for input(s): HGBA1C in the last 72 hours. CBG: Recent Labs  Lab 11/22/17 0730 11/22/17 1147 11/22/17 1651 11/22/17 2128 11/23/17 0715  GLUCAP 123* 142* 111* 135* 134*   Lipid Profile: No  results for input(s): CHOL, HDL, LDLCALC, TRIG, CHOLHDL, LDLDIRECT in the last 72 hours. Thyroid Function Tests: No results for input(s): TSH, T4TOTAL, FREET4, T3FREE, THYROIDAB in the last 72 hours. Anemia Panel: No results for input(s): VITAMINB12, FOLATE, FERRITIN, TIBC, IRON, RETICCTPCT in the last 72 hours. Sepsis Labs: No results for input(s): PROCALCITON, LATICACIDVEN in the last 168 hours.  No results found for this or any previous visit (from the past 240 hour(s)).    Radiology Studies: No results found.   Scheduled Meds: . atorvastatin  40 mg Oral Daily  . cloNIDine  0.1 mg Oral Daily  . clopidogrel  75 mg Oral Daily  . feeding supplement (ENSURE ENLIVE)  237 mL Oral BID BM  . insulin aspart  0-9 Units Subcutaneous TID WC  . isosorbide mononitrate  30 mg Oral Daily  . megestrol  400 mg Oral Daily  . metoprolol tartrate  50 mg Oral BID  . pantoprazole  40 mg Oral BID  . polyethylene glycol  17 g Oral Daily  . senna-docusate  1 tablet Oral BID    Continuous Infusions: . sodium chloride 50 mL/hr at 11/22/17 2242  . ferumoxytol 510 mg (11/16/17 1010)     LOS: 16 days   Time spent: Total of 15 minutes spent with pt, greater than 50% of which was spent in discussion of  treatment, counseling and coordination of care  Nita Sells, MD Pager: Text Page via [email or web address]   If 7PM-7AM, please contact night-coverage [email or web address] 11/23/2017, 10:55 AM

## 2017-11-23 NOTE — Progress Notes (Signed)
Physical Therapy Treatment Patient Details Name: [name] MRN: [id number] DOB: 20-Feb-1927 Today's Date: 11/23/2017    History of Present Illness [name] is a 84 y.o. female with medical history significant of DM 2, CVA left-sided weakness, history of CAD,  hypertension, hyperlipidemia, CKD 3, combined systolic diastolic CHF; admitted with dehydration, wt loss, dysphagia, EGD 11/14/17 scheduled    PT Comments    Pt assisted OOB and over to recliner.  Pt declined ambulating due to fatigue today.  Continue to recommend SNF upon d/c.  Follow Up Recommendations  SNF     Equipment Recommendations  None recommended by PT    Recommendations for Other Services       Precautions / Restrictions Precautions Precautions: Fall Precaution Comments: CVA L hemi    Mobility  Bed Mobility Overal bed mobility: Needs Assistance Bed Mobility: Supine to Sit     Supine to sit: Min assist     General bed mobility comments: required increased assist and time to complete  Transfers Overall transfer level: Needs assistance Equipment used: Rolling walker (2 wheeled) Transfers: Sit to/from [facility] Sit to Stand: Min assist Stand pivot transfers: Min assist       General transfer comment: verbal cues for hand placement, assist to rise, steady and control descent, pt felt unable to ambulate, only wished to pivot over to recliner today  Ambulation/Gait                 Stairs             Wheelchair Mobility    Modified Rankin (Stroke Patients Only)       Balance Overall balance assessment: Needs assistance         Standing balance support: During functional activity;Bilateral upper extremity supported Standing balance-Leahy Scale: Poor                              Cognition Arousal/Alertness: Awake/alert Behavior During Therapy: WFL for tasks assessed/performed                           Following Commands:  Follows one step commands with increased time     Problem Solving: Slow processing;Difficulty sequencing;Requires tactile cues;Requires verbal cues        Exercises      General Comments        Pertinent Vitals/Pain Pain Assessment: No/denies pain    Home Living                      Prior Function            PT Goals (current goals can now be found in the care plan section) Acute Rehab PT Goals PT Goal Formulation: With patient Time For Goal Achievement: 12/07/17 Potential to Achieve Goals: Fair Progress towards PT goals: Progressing toward goals    Frequency    Min 2X/week      PT Plan Current plan remains appropriate    Co-evaluation              AM-PAC PT "6 Clicks" Daily Activity  Outcome Measure  Difficulty turning over in bed (including adjusting bedclothes, sheets and blankets)?: A Lot Difficulty moving from lying on back to sitting on the side of the bed? : Unable Difficulty sitting down on and standing up from a chair with arms (e.g., wheelchair, bedside commode, etc,.)?: Unable Help needed moving  to and from a bed to chair (including a wheelchair)?: A Little Help needed walking in hospital room?: A Lot Help needed climbing 3-5 steps with a railing? : Total 6 Click Score: 10    End of Session Equipment Utilized During Treatment: Gait belt Activity Tolerance: Patient limited by fatigue Patient left: with call bell/phone within reach;in chair;with chair alarm set Nurse Communication: Mobility status PT Visit Diagnosis: Muscle weakness (generalized) (M62.81)     Time: 2867-5198 PT Time Calculation (min) (ACUTE ONLY): 13 min  Charges:  $Therapeutic Activity: 8-22 mins                    Carmelia Bake, PT, DPT Acute Rehabilitation Services Office: [phone] Pager: [phone]   Trena Platt 11/23/2017, 2:01 PM

## 2017-11-23 NOTE — Clinical Social Work Placement (Signed)
Patient received and accepted bed offer at [facility]. Facility confirmed bed offer [facility] insurance authorization) received. CSW will continue to follow and assist with discharge planning.  CLINICAL SOCIAL WORK PLACEMENT  NOTE  Date:  11/23/2017  Patient Details  Name: [name] MRN: [id number] Date of Birth: 02-Jul-1927  Clinical Social Work is seeking post-discharge placement for this patient at the [place] level of care (*CSW will initial, date and re-position this form in  chart as items are completed):  Yes   Patient/family provided with [place] Work Department's list of facilities offering this level of care within the geographic area requested by the patient (or if unable, by the patient's family).  Yes   Patient/family informed of their freedom to choose among providers that offer the needed level of care, that participate in Medicare, Medicaid or managed care program needed by the patient, have an available bed and are willing to accept the patient.  Yes   Patient/family informed of [facility]'s ownership interest in [facility] and [facility], as well as of the fact that they are under no obligation to receive care at these facilities.  PASRR submitted to EDS on       PASRR number received on       Existing PASRR number confirmed on 11/21/17     FL2 transmitted to all facilities in geographic area requested by pt/family on 11/21/17     FL2 transmitted to all facilities within larger geographic area on       Patient informed that his/her managed care company has contracts with or will negotiate with certain facilities, including the following:        Yes   Patient/family informed of bed offers received.  Patient chooses bed at [facility]     Physician recommends and patient chooses bed at      Patient to be transferred to   on  .  Patient to be transferred to facility by       Patient family notified  on   of transfer.  Name of family member notified:        PHYSICIAN       Additional Comment:    _______________________________________________ Burnis Medin, LCSW 11/23/2017, 11:09 AM

## 2017-11-24 LAB — PTH, INTACT AND CALCIUM
Calcium, Total (PTH): 10.2 mg/dL (ref 8.7–10.3)
PTH: 27 pg/mL (ref 15–65)

## 2017-11-24 LAB — GLUCOSE, CAPILLARY
GLUCOSE-CAPILLARY: 156 mg/dL — AB (ref 70–99)
Glucose-Capillary: 144 mg/dL — ABNORMAL HIGH (ref 70–99)

## 2017-11-24 LAB — VITAMIN D 25 HYDROXY (VIT D DEFICIENCY, FRACTURES): Vit D, 25-Hydroxy: 31.3 ng/mL (ref 30.0–100.0)

## 2017-11-24 MED ORDER — METOPROLOL TARTRATE 75 MG PO TABS: 75.0000 mg | ORAL_TABLET | Freq: Two times a day (BID) | ORAL | Status: DC

## 2017-11-24 MED ORDER — MEGESTROL ACETATE 400 MG/10ML PO SUSP: 400.0000 mg | Freq: Every day | ORAL | 0 refills | Status: DC

## 2017-11-24 MED ORDER — PANTOPRAZOLE SODIUM 40 MG PO TBEC: 40.0000 mg | DELAYED_RELEASE_TABLET | Freq: Two times a day (BID) | ORAL | Status: DC

## 2017-11-24 MED ORDER — ENSURE ENLIVE PO LIQD: 237.0000 mL | Freq: Two times a day (BID) | ORAL | 12 refills | Status: DC

## 2017-11-24 MED ORDER — GLIPIZIDE 5 MG PO TABS: 10.0000 mg | ORAL_TABLET | Freq: Every day | ORAL | Status: DC

## 2017-11-24 NOTE — Discharge Summary (Signed)
Physician Discharge Summary  [name] IEP:[id number] DOB: 07-12-1927 DOA: 11/04/2017  PCP: Deland Pretty, MD  Admit date: 11/04/2017 Discharge date: 11/24/2017  Time spent: 30 minutes  Recommendations for Outpatient Follow-up:  1. Suggest outpatient goals of care if patient does not eat as patient does not want a PEG tube-recommend discussion of her overall care with her son who is a physician-phone number [phone] 2. Needs basic metabolic panel and CBC 1 week 3. Plavix was discontinued at the request of family 4. Multiple medications adjusted this admission-clonidine discontinued, amlodipine also, increased metoprolol to current dose, cut back dose of glipizide from 10-5 his poor p.o. intake and may need to discontinue this if she has low sugars 5. Needs a 43-month follow-up CT scan of chest because of groundglass opacities  Discharge Diagnoses:  Active Problems:   Hyperlipidemia   Essential hypertension   CORONARY ATHEROSCLEROSIS NATIVE CORONARY ARTERY   Diabetes mellitus with complication (HCC)   Hemiparesis affecting left side as late effect of cerebrovascular accident (CVA) (Eugenio Saenz)   Stage 3 chronic kidney disease (HCC)   Malnutrition of moderate degree   Dehydration   Abdominal pain   Pressure injury of skin   Bilious vomiting with nausea   Enlarged lymph node   Dysphagia   Nausea and vomiting   Abnormal loss of weight   Abnormal finding on GI tract imaging   Discharge Condition: Fair to guarded  Diet recommendation: Soft diet dysphagia II  Filed Weights   11/21/17 1308 11/23/17 0518 11/24/17 0520  Weight: 58.1 kg 53.6 kg 52.9 kg    History of present illness:  84 year old diabetes mellitus type 2,  CVA with left side residual weakness,  CAD CABG 2011, hypertension, CKD III, 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 Chronic stage II sacral ulcer Umbilical lymph node nodule not biopsied after discussion with family combined systolic and diastolic CHF     Admitted 10/5  w VOl depletion nausea and vomiting for 1 week prior to admission, associated with unintentional weight loss-Recent 50 pound weight loss GI was consulted-rapid response appeared to have occurred--had chest pain cardiology discussed with her and she was disinclined for aggressive management of this Underwent EGD 10/21 suggestive of achalasia Botox was done to LES and patient's diet was graduated  Dysphagia - Achalasia CT abdomen/pelvis body esophagram is relative really unremarkable except large hiatal hernia. 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   Status post EGD 10/21 showed findings consistent with achalasia.  Patient was treated with botulism toxin.  Per GI advance diet to dysphagia 2 but she is not eating well and is only taking and 10 to 20% of the diet--she was downgraded from a dysphagia 3 to dysphagia 2 diet because she was not able to eat the chopped meats She has tolerated some more diet gradually and I have encouraged her to drink milk and Ensure as she seems to enjoy that   Anemia of chronic disease Felt to be multifactorial due to malnutrition and CKD. ? GI bleed in setting of Plavix  Due to poor oral intake, patient receiving IV iron Feraheme weekly hemoglobin down from baseline.  No  overt bleeding-resumed Plavix on 10/24 as hemoglobin 7-8 range-son had a concern about restarting Plavix as this was discontinued-ideally should be on this but because of multifactorial anemia reasonable to hold off suggest outpatient coordination with cardiology and discussion with family regarding the same   CAD, elevated troponin and sinus tachycardia. -  remains stable  Episodic chest pain this admission-disinclined for intervention-cardiology recommended to continue medical treatment with statin, metoprolol, isosorbide mononitrate and Plavix Stable at this time without chest pain   Abnormal chest CT CT  image showed groundglass opacities in the right lung, pulmonary was consulted and felt to be secondary to aspiration pneumonitis.  Recommend outpatient follow-up and repeat CT scan in 2 to 3 months.   Diabetes mellitus type 2 Glipizide 10 every morning on hold at this time resumed at lower dose of 5 on discharge Sugars in the 130 range SSI   Essential hypertension Blood pressure elevated probably because of withdrawal of clonidine and amlodipine-we will titrate Lopressor slightly today and continue, continue  Imdur.   CKD stage III - stable Renal function improved this admission with a baseline in the 1.3's around the time of admission to currently 1.1    Chronic diastolic and systolic heart failure Continues to be euvolemic.  Continue Imdur 30, metoprolol 50 twice daily, amlodipine 5 On no diuretics at this time   Stage II sacral ulcer-POA Continue local wound care did not visualize today   Periumbilical adenopathy.Patient and family decided not to further investigate. Mild elevated CA 19 9 and elevated Ca1 25.   Severe malnutrition Due to dysphagia. Expect to improve if dysphagia improved Continue laxatives MiraLAX and senna I added Megace at the request of her son 400 mg and adjusted some medications including amlodipine which I discontinued 10/23 thus this can slow gastric motility  Adult failure to thrive We addressed PEG feeding this morning 10/24 and patient is completely unwilling to do this-I think patient will need a couple more days in the hospital to see what her diet does and if she is not getting adequate nutrition we may need to discuss further with her regarding therapeutic versus palliative goals   Procedures:  EGD 10/21 with Botox injection (i.e. Studies not automatically included, echos, thoracentesis, etc; not x-rays)  Consultations:  GI  Discharge Exam: Vitals:   11/23/17 2008 11/24/17 0520  BP: (!) 156/67 139/77  Pulse: 94 85  Resp: 19 (!) 22  Temp:  98 F (36.7 C) 98.6 F (37 C)  SpO2: 98% 100%    General: Awake alert pleasant no distress Cardiovascular: S1-S2 no murmur Respiratory: Chest clinically clear Abdomen soft slightly distended Stage II decubitus ulcer present on bottom No lower extremity edema  Discharge Instructions   Discharge Instructions    (HEART FAILURE PATIENTS) Call MD:  Anytime you have any of the following symptoms: 1) 3 pound weight gain in 24 hours or 5 pounds in 1 week 2) shortness of breath, with or without a dry hacking cough 3) swelling in the hands, feet or stomach 4) if you have to sleep on extra pillows at night in order to breathe.   Complete by:  As directed    Diet - low sodium heart healthy   Complete by:  As directed    Increase activity slowly   Complete by:  As directed      Allergies as of 11/24/2017      Reactions   Contrast Media [iodinated Diagnostic Agents]    Sensitivity, kidney issues      Medication List    STOP taking these medications   cloNIDine 0.1 MG tablet Commonly known as:  CATAPRES   clopidogrel 75 MG tablet Commonly known as:  PLAVIX     TAKE these medications   acetaminophen 500 MG tablet Commonly known as:  TYLENOL Take 500  mg by mouth every 6 (six) hours as needed for headache.   amLODipine 10 MG tablet Commonly known as:  NORVASC TAKE ONE TABLET BY MOUTH ONCE DAILY.   atorvastatin 40 MG tablet Commonly known as:  LIPITOR Take 1 tablet (40 mg total) by mouth daily.   cholecalciferol 400 units Tabs tablet Commonly known as:  VITAMIN D Take 1 tablet (400 Units total) by mouth daily.   feeding supplement (ENSURE ENLIVE) Liqd Take 237 mLs by mouth 2 (two) times daily between meals.   ferrous sulfate 220 (44 Fe) MG/5ML solution Take 10 mLs by mouth daily.   glipiZIDE 5 MG tablet Commonly known as:  GLUCOTROL Take 2 tablets (10 mg total) by mouth daily before breakfast. What changed:  medication strength   isosorbide mononitrate 30 MG 24 hr  tablet Commonly known as:  IMDUR TAKE 1 TABLET BY MOUTH ONCE DAILY   megestrol 400 MG/10ML suspension Commonly known as:  MEGACE Take 10 mLs (400 mg total) by mouth daily.   Metoprolol Tartrate 75 MG Tabs Take 75 mg by mouth 2 (two) times daily. What changed:    medication strength  how much to take   nitroGLYCERIN 0.4 MG SL tablet Commonly known as:  NITROSTAT Place 1 tablet (0.4 mg total) under the tongue every 5 (five) minutes x 3 doses as needed for chest pain.   pantoprazole 40 MG tablet Commonly known as:  PROTONIX Take 1 tablet (40 mg total) by mouth 2 (two) times daily.   polyethylene glycol packet Commonly known as:  MIRALAX / GLYCOLAX Take 17 g by mouth daily.   protein supplement shake Liqd Commonly known as:  PREMIER PROTEIN Take 325 mLs (11 oz total) by mouth daily.   senna 8.6 MG Tabs tablet Commonly known as:  SENOKOT Take 1 tablet (8.6 mg total) by mouth daily.      Allergies  Allergen Reactions  . Contrast Media [Iodinated Diagnostic Agents]     Sensitivity, kidney issues   Follow-up Information    Minus Breeding, MD Follow up.   Specialty:  Cardiology Why:  office will call with time and date of appointment  Contact information: [place] [address] [phone]        Willia Craze, NP Follow up on 12/06/2017.   Specialty:  Gastroenterology Why:  At 11:00 am Contact information: [place] [address] [phone]            The results of significant diagnostics from this hospitalization (including imaging, microbiology, ancillary and laboratory) are listed below for reference.    Significant Diagnostic Studies: Ct Abdomen Pelvis Wo Contrast  Result Date: 11/04/2017 CLINICAL DATA:  84 year old female with a history of abdominal pain and weight loss EXAM: CT ABDOMEN AND PELVIS WITHOUT CONTRAST TECHNIQUE: Multidetector CT imaging of the abdomen and pelvis was performed following  the standard protocol without IV contrast. COMPARISON:  06/30/2011 FINDINGS: Lower chest: Mixed ground-glass and nodularity of the lateral segment of the right middle lobe, new from the comparison. There are additional foci of the right lower lobe and left lower lobe of mixed ground-glass/nodularity. Coronary artery disease. Differential attenuation of the blood pool and the myocardium. Hiatal hernia. Hepatobiliary: Unremarkable liver.  Unremarkable gallbladder. Pancreas: Unremarkable pancreas Spleen: Unremarkable spleen Adrenals/Urinary Tract: Unremarkable appearance of the adrenal glands. No evidence of hydronephrosis of the right or left kidney. Vascular calcifications of the bilateral renal hila. Unremarkable course of the bilateral ureters. Unremarkable appearance of the  urinary bladder. Nonobstructive stone at the inferior collecting system of the left kidney measures 2 mm. Stomach/Bowel: Hiatal hernia. Otherwise unremarkable stomach. Unremarkable small bowel with no abnormal distention. Moderate stool burden without transition point or evidence of obstruction. No inflammatory changes adjacent to the colon. Appendix is not visualized, however, no inflammatory changes are present adjacent to the cecum to indicate an appendicitis. Vascular/Lymphatic: Dense calcifications of the abdominal aorta mesenteric arteries renal arteries and lower extremities. Reproductive: Fibroid uterus. Cystic lesion associated with the left adnexa measures 2.3 cm. This has enlarged slightly from the comparison CT of 2013. Other: Lymph node within the umbilical fat containing hernia, new from the comparison CT and measures 8 mm. No intra-abdominal lymphadenopathy. No inguinal adenopathy. No pelvic adenopathy. Musculoskeletal: Multilevel degenerative changes of the thoracolumbar spine, with vacuum disc phenomenon extending from L1-S1 and associated broad-based disc bulges. No acute displaced fracture identified. Degenerative changes of  the hips. IMPRESSION: No acute CT finding of the abdomen. Evidence of anemia. Of interest, there is a new lymph node within the fat containing umbilical hernia. In the absence of other lymph nodes/lymphadenopathy, this may be reactive, however, a node at this site has been associated with GI/intra-abdominal malignancy (sister Wynona Dove node), and referral for primary care valuation and review of health screening tests such as colonoscopy recommended. Cystic lesion of the left adnexa measures 2.3 cm. Referral for gynecologic evaluation and consideration of initiating ultrasound surveillance recommended, as this could potentially represent a GYN malignancy in a patient of this age. Hiatal hernia. Nonobstructive left nephrolithiasis. Aortic Atherosclerosis (ICD10-I70.0). Associated coronary artery disease, mesenteric artery disease, renal artery disease, lower extremity arterial disease. Electronically Signed   By: Corrie Mckusick D.O.   On: 11/04/2017 12:17   Dg Chest 2 View  Result Date: 11/04/2017 CLINICAL DATA:  Abnormality shown on previous CT scan today; hx CAD, HTN, pleural effusion; non smoker' diabetic EXAM: CHEST - 2 VIEW COMPARISON:  none FINDINGS: Lungs are clear. Heart size and mediastinal contours are within normal limits. Previous CABG. Aortic Atherosclerosis (ICD10-170.0). No effusion. Visualized bones unremarkable, post median sternotomy. IMPRESSION: No acute cardiopulmonary disease, post CABG. Electronically Signed   By: Lucrezia Europe M.D.   On: 11/04/2017 13:57   Dg Abd 1 View  Result Date: 11/08/2017 CLINICAL DATA:  Check for retained barium EXAM: ABDOMEN - 1 VIEW COMPARISON:  11/07/2017 FINDINGS: Contrast material is again identified throughout the colon. No obstructive changes are seen. Degenerative changes of lumbar spine are again noted. No new focal abnormality is seen. IMPRESSION: Considerable retained contrast within the colon likely precluding adequate upper GI examination.  Electronically Signed   By: Inez Catalina M.D.   On: 11/08/2017 17:10   Dg Abd 1 View  Result Date: 11/07/2017 CLINICAL DATA:  84 year old female. Post esophagram. Possible upper GI series. Subsequent encounter. EXAM: ABDOMEN - 1 VIEW COMPARISON:  11/06/2017 esophagram.  11/04/2017 CT. FINDINGS: Residual contrast throughout the colon. Scoliosis lumbar spine convex right with superimposed degenerative changes. Post CABG. IMPRESSION: Residual contrast throughout the colon. Electronically Signed   By: Genia Del M.D.   On: 11/07/2017 12:16   Dg Esophagus  Result Date: 11/06/2017 CLINICAL DATA:  Dysphagia.  Nausea vomiting EXAM: ESOPHOGRAM/BARIUM SWALLOW TECHNIQUE: Single contrast examination was performed using  thin barium. FLUOROSCOPY TIME:  Fluoroscopy Time:  1 minutes 48 seconds Radiation Exposure Index (if provided by the fluoroscopic device): Number of Acquired Spot Images: 0 COMPARISON:  CT abdomen pelvis 11/04/2017 FINDINGS: Poor esophageal motility. Esophagus diffusely dilated with  poor motility. No stricture or mass. PET Moderately large hiatal hernia. Sliding hiatal hernia. Grossly normal stomach. Barium tablet not administered due to poor motility and retained barium throughout the esophagus. In addition, the patient was not able to stand upright. IMPRESSION: Moderately large hiatal hernia with poor esophageal motility. Negative for stricture or mass. Electronically Signed   By: Franchot Gallo M.D.   On: 11/06/2017 10:09   Dg Chest Port 1 View  Result Date: 11/09/2017 CLINICAL DATA:  Shortness of breath. History of hypertension and diabetes. Nonsmoker. EXAM: PORTABLE CHEST 1 VIEW COMPARISON:  11/04/2017 FINDINGS: Postoperative changes in the mediastinum. Cardiac enlargement. Perihilar interstitial infiltrates suggesting edema. No focal consolidation. No blunting of costophrenic angles. No pneumothorax. Calcified and tortuous aorta. IMPRESSION: Cardiac enlargement with perihilar edema.  Electronically Signed   By: Lucienne Capers M.D.   On: 11/09/2017 01:13   Dg Ugi W/small Bowel High Density  Result Date: 11/10/2017 CLINICAL DATA:  Dysphagia.  Patient reports food coming back up. EXAM: UPPER GI SERIES WITH KUB TECHNIQUE: After obtaining a scout radiograph a routine upper GI series was performed using thin barium FLUOROSCOPY TIME:  Fluoroscopy Time:  2.1 minutes Radiation Exposure Index (if provided by the fluoroscopic device): 60 mGy Number of Acquired Spot Images: 15 COMPARISON:  CT 10 19, gastric emptying study 11/08/2017, esophagram 11/06/2017 FINDINGS: Limited study due to patient immobility. Patient could not stand and has limited mobility. Single contrast exam performed through this region. Patient drank oral contrast with straw. Contrast filled a distended esophagus. Contrast was obstructed at the distal esophagus for long period time and was retained in the esophagus. Fluid esophagus retained to the level of the cervical esophagus. There is abrupt narrowing of the distal esophagus at the GE junction. Ultimately this stricturing at the [place] relaxes and contrast passed into the stomach. There is a smoothly marginated indentation at the GE junction which persists on all images. Limited view of the stomach demonstrates no evidence of obstruction or mass or torsion. Limited examination the stomach as above. At the end of the study with still significant retention of oral contrast within the esophagus. IMPRESSION: 1. 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 Differential includes benign and malignant stricturing and spasm at the GE junction. Recommend upper endoscopy for further evaluation. 2. Significant retention oral contrast within the esophagus during the entirety of exam 3. No gross evidence of mass within the stomach on limited exam. Findings conveyed toJESSICA ZEHR on 11/10/2017  at13:00. Electronically Signed   By: Suzy Bouchard M.D.   On: 11/10/2017 13:03   Nm Gastric Emptying Solid Liquid Both W/sb Transit  Result Date: 11/08/2017 CLINICAL DATA:  Nausea and vomiting, history stroke, type II diabetes mellitus, coronary disease post MI, hypertension EXAM: NUCLEAR MEDICINE GASTRIC EMPTYING SCAN TECHNIQUE: After oral ingestion of radiolabeled meal, sequential abdominal images were obtained for 4 hours. Percentage of activity emptying the stomach was calculated at 1 hour, 2 hour, 3 hour, and 4 hours. RADIOPHARMACEUTICALS:  2.1 mCi Tc-88m sulfur colloid in standardized meal COMPARISON:  None FINDINGS: Expected location of the stomach in the left upper quadrant. Tracer empties the stomach gradually over the course of the study. 34% emptied at 1 hr ( normal >= 10%) 62% emptied at 2 hr ( normal >= 40%) 75% emptied at 3 hr ( normal >= 70%) 81% emptied at 4 hr ( normal >= 90%) IMPRESSION: Minimally decreased  gastric emptying. Electronically Signed   By: Elta Guadeloupe  Thornton Papas M.D.   On: 11/08/2017 13:14    Microbiology: No results found for this or any previous visit (from the past 240 hour(s)).   Labs: Basic Metabolic Panel: Recent Labs  Lab 11/18/17 0610 11/19/17 0858 11/20/17 0550 11/22/17 0626 11/23/17 0558 11/23/17 0910  NA 134* 134* 134* 135 134*  --   K 3.4* 3.5 3.6 3.5 3.5  --   CL 100 98 101 99 99  --   CO2 23 21* 21* 24 23  --   GLUCOSE 117* 147* 120* 121* 141*  --   BUN 12 11 11 12 11   --   CREATININE 1.25* 1.32* 1.24* 1.16* 1.24*  --   CALCIUM 9.4 9.8 9.3 9.1 9.4 10.2   Liver Function Tests: No results for input(s): AST, ALT, ALKPHOS, BILITOT, PROT, ALBUMIN in the last 168 hours. No results for input(s): LIPASE, AMYLASE in the last 168 hours. No results for input(s): AMMONIA in the last 168 hours. CBC: Recent Labs  Lab 11/18/17 0610 11/19/17 0858 11/20/17 0550 11/21/17 0601 11/22/17 0626 11/23/17 0558  WBC 8.8 8.6 8.4 9.9 7.8 7.8  NEUTROABS 6.7 6.7  --  7.8* 6.0 6.1  HGB 7.5* 7.8*  8.0* 7.4*  8.8* 7.3* 8.2*  HCT 25.2* 26.1* 23.9* 29.2* 24.2* 26.8*  MCV 89.0 88.2 88.8 89.6 89.6 88.4  PLT 433* 464* 400 411* 320 359   Cardiac Enzymes: No results for input(s): CKTOTAL, CKMB, CKMBINDEX, TROPONINI in the last 168 hours. BNP: BNP (last 3 results) Recent Labs    11/09/17 0023  BNP 1,407.1*    ProBNP (last 3 results) No results for input(s): PROBNP in the last 8760 hours.  CBG: Recent Labs  Lab 11/23/17 0715 11/23/17 1133 11/23/17 1644 11/23/17 2018 11/24/17 0737  GLUCAP 134* 199* 157* 145* 144*       Signed:  Nita Sells MD   [facility] 11/24/2017, 10:03 AM

## 2017-11-24 NOTE — Progress Notes (Signed)
Came to meet patient and offer spiritual care/support in response to spiritual care consult from physician.  Patient seemed possibly confused so I said Im like a pastor in the hospital.  She looked and nodded her head no she did not want to speak with chaplain or have chaplain to pray with her. Conard Novak, Chaplain    11/24/17 0900  Clinical Encounter Type  Visited With Patient  Visit Type Initial;Spiritual support  Referral From Physician  Consult/Referral To Chaplain  Spiritual Encounters  Spiritual Needs Other (Comment) (patient declined spiritual care/support)  Stress Factors  Patient Stress Factors None identified  Family Stress Factors None identified

## 2017-11-24 NOTE — Progress Notes (Signed)
Transported off unit via [facility] at 2030

## 2017-11-24 NOTE — Progress Notes (Signed)
Patient discharging to [facility] room 703P.  CSW faxed appropriate documents and confirmed bed. Son, Kali Deadwyler, aware of discharge. [facility] will be called for transport at 4:00 per son's request.  RN call report: [phone].   Pricilla Holm, MSW, [place] [facility] [phone] (Weekend)

## 2017-11-24 NOTE — Progress Notes (Signed)
Patient was not able to communicate very much during initial meeting.  [employer] was listed as her religious preference. Because she was listed as [place] (Christian)  I decided having some encouraging scriptures could be helpful that she could read (if stroke lets her do so) . I found several appropriate scriptures I felt would be comforting to her and wrote them out like a card.   I gave them to her and she was able to say thank you.   Conard Novak, Chaplain   11/24/17 1100  Clinical Encounter Type  Visited With Patient  Visit Type Follow-up  Referral From Physician  Consult/Referral To Chaplain  Spiritual Encounters  Spiritual Needs [facility] text;Other (Comment)

## 2017-11-24 NOTE — Progress Notes (Signed)
Report called to [facility] at [phone]

## 2017-11-27 NOTE — Telephone Encounter (Signed)
Attempted to call patient, no answer, left message to call back.  Dr. Lake Bells has 30 minute appointments available on 02/07/2018 for patient to have her 3 month ROV/hospital follow up.

## 2017-11-29 NOTE — Telephone Encounter (Signed)
Attempted to call patient to schedule hospital follow up. No answer and mailbox full, unable to leave message. Will try patient again.

## 2017-12-05 ENCOUNTER — Inpatient Hospital Stay (HOSPITAL_COMMUNITY)
Admission: EM | Admit: 2017-12-05 | Discharge: 2018-02-26 | DRG: 682 | Disposition: A | Discharge status: Other Acute Inpatient Hospital | Payer: Medicare Other | Attending: Family Medicine | Admitting: Family Medicine

## 2017-12-05 ENCOUNTER — Emergency Department (HOSPITAL_COMMUNITY): Payer: Medicare Other

## 2017-12-05 ENCOUNTER — Encounter (HOSPITAL_COMMUNITY): Payer: Self-pay | Admitting: Neurology

## 2017-12-05 DIAGNOSIS — E785 Hyperlipidemia, unspecified: Secondary | ICD-10-CM | POA: Diagnosis present

## 2017-12-05 DIAGNOSIS — E876 Hypokalemia: Secondary | ICD-10-CM | POA: Diagnosis not present

## 2017-12-05 DIAGNOSIS — I214 Non-ST elevation (NSTEMI) myocardial infarction: Secondary | ICD-10-CM | POA: Diagnosis not present

## 2017-12-05 DIAGNOSIS — E43 Unspecified severe protein-calorie malnutrition: Secondary | ICD-10-CM | POA: Diagnosis present

## 2017-12-05 DIAGNOSIS — J9621 Acute and chronic respiratory failure with hypoxia: Secondary | ICD-10-CM | POA: Diagnosis not present

## 2017-12-05 DIAGNOSIS — Z91041 Radiographic dye allergy status: Secondary | ICD-10-CM

## 2017-12-05 DIAGNOSIS — G2401 Drug induced subacute dyskinesia: Secondary | ICD-10-CM | POA: Diagnosis not present

## 2017-12-05 DIAGNOSIS — R0902 Hypoxemia: Secondary | ICD-10-CM

## 2017-12-05 DIAGNOSIS — R945 Abnormal results of liver function studies: Secondary | ICD-10-CM | POA: Diagnosis not present

## 2017-12-05 DIAGNOSIS — F0280 Dementia in other diseases classified elsewhere without behavioral disturbance: Secondary | ICD-10-CM | POA: Diagnosis present

## 2017-12-05 DIAGNOSIS — N185 Chronic kidney disease, stage 5: Secondary | ICD-10-CM | POA: Diagnosis not present

## 2017-12-05 DIAGNOSIS — Z7401 Bed confinement status: Secondary | ICD-10-CM

## 2017-12-05 DIAGNOSIS — I2510 Atherosclerotic heart disease of native coronary artery without angina pectoris: Secondary | ICD-10-CM | POA: Diagnosis present

## 2017-12-05 DIAGNOSIS — R471 Dysarthria and anarthria: Secondary | ICD-10-CM | POA: Diagnosis present

## 2017-12-05 DIAGNOSIS — K220 Achalasia of cardia: Secondary | ICD-10-CM | POA: Diagnosis present

## 2017-12-05 DIAGNOSIS — K429 Umbilical hernia without obstruction or gangrene: Secondary | ICD-10-CM | POA: Diagnosis present

## 2017-12-05 DIAGNOSIS — F0150 Vascular dementia without behavioral disturbance: Secondary | ICD-10-CM | POA: Diagnosis present

## 2017-12-05 DIAGNOSIS — E860 Dehydration: Secondary | ICD-10-CM | POA: Diagnosis present

## 2017-12-05 DIAGNOSIS — R599 Enlarged lymph nodes, unspecified: Secondary | ICD-10-CM | POA: Diagnosis present

## 2017-12-05 DIAGNOSIS — L89312 Pressure ulcer of right buttock, stage 2: Secondary | ICD-10-CM | POA: Diagnosis present

## 2017-12-05 DIAGNOSIS — Z681 Body mass index (BMI) 19 or less, adult: Secondary | ICD-10-CM

## 2017-12-05 DIAGNOSIS — N179 Acute kidney failure, unspecified: Secondary | ICD-10-CM | POA: Diagnosis not present

## 2017-12-05 DIAGNOSIS — R1312 Dysphagia, oropharyngeal phase: Secondary | ICD-10-CM | POA: Diagnosis not present

## 2017-12-05 DIAGNOSIS — B952 Enterococcus as the cause of diseases classified elsewhere: Secondary | ICD-10-CM | POA: Diagnosis present

## 2017-12-05 DIAGNOSIS — Z4659 Encounter for fitting and adjustment of other gastrointestinal appliance and device: Secondary | ICD-10-CM

## 2017-12-05 DIAGNOSIS — R229 Localized swelling, mass and lump, unspecified: Secondary | ICD-10-CM

## 2017-12-05 DIAGNOSIS — R0602 Shortness of breath: Secondary | ICD-10-CM

## 2017-12-05 DIAGNOSIS — E861 Hypovolemia: Secondary | ICD-10-CM | POA: Diagnosis not present

## 2017-12-05 DIAGNOSIS — M6284 Sarcopenia: Secondary | ICD-10-CM | POA: Diagnosis present

## 2017-12-05 DIAGNOSIS — I69354 Hemiplegia and hemiparesis following cerebral infarction affecting left non-dominant side: Secondary | ICD-10-CM

## 2017-12-05 DIAGNOSIS — E119 Type 2 diabetes mellitus without complications: Secondary | ICD-10-CM

## 2017-12-05 DIAGNOSIS — E1165 Type 2 diabetes mellitus with hyperglycemia: Secondary | ICD-10-CM | POA: Diagnosis present

## 2017-12-05 DIAGNOSIS — R627 Adult failure to thrive: Secondary | ICD-10-CM

## 2017-12-05 DIAGNOSIS — R1110 Vomiting, unspecified: Secondary | ICD-10-CM

## 2017-12-05 DIAGNOSIS — J90 Pleural effusion, not elsewhere classified: Secondary | ICD-10-CM | POA: Diagnosis present

## 2017-12-05 DIAGNOSIS — I5043 Acute on chronic combined systolic (congestive) and diastolic (congestive) heart failure: Secondary | ICD-10-CM | POA: Diagnosis present

## 2017-12-05 DIAGNOSIS — I130 Hypertensive heart and chronic kidney disease with heart failure and stage 1 through stage 4 chronic kidney disease, or unspecified chronic kidney disease: Secondary | ICD-10-CM | POA: Diagnosis present

## 2017-12-05 DIAGNOSIS — N183 Chronic kidney disease, stage 3 unspecified: Secondary | ICD-10-CM | POA: Diagnosis present

## 2017-12-05 DIAGNOSIS — L89152 Pressure ulcer of sacral region, stage 2: Secondary | ICD-10-CM | POA: Diagnosis present

## 2017-12-05 DIAGNOSIS — I10 Essential (primary) hypertension: Secondary | ICD-10-CM | POA: Diagnosis present

## 2017-12-05 DIAGNOSIS — J690 Pneumonitis due to inhalation of food and vomit: Secondary | ICD-10-CM

## 2017-12-05 DIAGNOSIS — J9690 Respiratory failure, unspecified, unspecified whether with hypoxia or hypercapnia: Secondary | ICD-10-CM

## 2017-12-05 DIAGNOSIS — R4701 Aphasia: Secondary | ICD-10-CM | POA: Diagnosis present

## 2017-12-05 DIAGNOSIS — Z79899 Other long term (current) drug therapy: Secondary | ICD-10-CM

## 2017-12-05 DIAGNOSIS — N3000 Acute cystitis without hematuria: Secondary | ICD-10-CM | POA: Diagnosis not present

## 2017-12-05 DIAGNOSIS — E1151 Type 2 diabetes mellitus with diabetic peripheral angiopathy without gangrene: Secondary | ICD-10-CM | POA: Diagnosis present

## 2017-12-05 DIAGNOSIS — J9600 Acute respiratory failure, unspecified whether with hypoxia or hypercapnia: Secondary | ICD-10-CM

## 2017-12-05 DIAGNOSIS — I429 Cardiomyopathy, unspecified: Secondary | ICD-10-CM | POA: Diagnosis present

## 2017-12-05 DIAGNOSIS — E875 Hyperkalemia: Secondary | ICD-10-CM | POA: Diagnosis not present

## 2017-12-05 DIAGNOSIS — I639 Cerebral infarction, unspecified: Secondary | ICD-10-CM | POA: Diagnosis present

## 2017-12-05 DIAGNOSIS — Z7984 Long term (current) use of oral hypoglycemic drugs: Secondary | ICD-10-CM

## 2017-12-05 DIAGNOSIS — R4182 Altered mental status, unspecified: Secondary | ICD-10-CM

## 2017-12-05 DIAGNOSIS — D259 Leiomyoma of uterus, unspecified: Secondary | ICD-10-CM | POA: Diagnosis present

## 2017-12-05 DIAGNOSIS — J150 Pneumonia due to Klebsiella pneumoniae: Secondary | ICD-10-CM | POA: Diagnosis not present

## 2017-12-05 DIAGNOSIS — Z66 Do not resuscitate: Secondary | ICD-10-CM | POA: Diagnosis present

## 2017-12-05 DIAGNOSIS — Z951 Presence of aortocoronary bypass graft: Secondary | ICD-10-CM

## 2017-12-05 DIAGNOSIS — K5900 Constipation, unspecified: Secondary | ICD-10-CM

## 2017-12-05 DIAGNOSIS — Z955 Presence of coronary angioplasty implant and graft: Secondary | ICD-10-CM

## 2017-12-05 DIAGNOSIS — L89322 Pressure ulcer of left buttock, stage 2: Secondary | ICD-10-CM | POA: Diagnosis present

## 2017-12-05 DIAGNOSIS — R601 Generalized edema: Secondary | ICD-10-CM

## 2017-12-05 DIAGNOSIS — E11649 Type 2 diabetes mellitus with hypoglycemia without coma: Secondary | ICD-10-CM | POA: Diagnosis not present

## 2017-12-05 DIAGNOSIS — Z978 Presence of other specified devices: Secondary | ICD-10-CM

## 2017-12-05 DIAGNOSIS — I509 Heart failure, unspecified: Secondary | ICD-10-CM

## 2017-12-05 DIAGNOSIS — R0689 Other abnormalities of breathing: Secondary | ICD-10-CM

## 2017-12-05 DIAGNOSIS — I340 Nonrheumatic mitral (valve) insufficiency: Secondary | ICD-10-CM | POA: Diagnosis present

## 2017-12-05 DIAGNOSIS — Z833 Family history of diabetes mellitus: Secondary | ICD-10-CM

## 2017-12-05 DIAGNOSIS — R0600 Dyspnea, unspecified: Secondary | ICD-10-CM

## 2017-12-05 DIAGNOSIS — G9341 Metabolic encephalopathy: Secondary | ICD-10-CM | POA: Diagnosis not present

## 2017-12-05 DIAGNOSIS — N390 Urinary tract infection, site not specified: Secondary | ICD-10-CM

## 2017-12-05 DIAGNOSIS — K5641 Fecal impaction: Secondary | ICD-10-CM | POA: Diagnosis not present

## 2017-12-05 DIAGNOSIS — J811 Chronic pulmonary edema: Secondary | ICD-10-CM

## 2017-12-05 DIAGNOSIS — R0989 Other specified symptoms and signs involving the circulatory and respiratory systems: Secondary | ICD-10-CM

## 2017-12-05 DIAGNOSIS — R52 Pain, unspecified: Secondary | ICD-10-CM

## 2017-12-05 DIAGNOSIS — Z7189 Other specified counseling: Secondary | ICD-10-CM

## 2017-12-05 DIAGNOSIS — R1310 Dysphagia, unspecified: Secondary | ICD-10-CM

## 2017-12-05 DIAGNOSIS — I6381 Other cerebral infarction due to occlusion or stenosis of small artery: Secondary | ICD-10-CM | POA: Diagnosis not present

## 2017-12-05 DIAGNOSIS — E870 Hyperosmolality and hypernatremia: Secondary | ICD-10-CM | POA: Diagnosis not present

## 2017-12-05 DIAGNOSIS — Z8249 Family history of ischemic heart disease and other diseases of the circulatory system: Secondary | ICD-10-CM

## 2017-12-05 DIAGNOSIS — E1122 Type 2 diabetes mellitus with diabetic chronic kidney disease: Secondary | ICD-10-CM | POA: Diagnosis present

## 2017-12-05 DIAGNOSIS — D631 Anemia in chronic kidney disease: Secondary | ICD-10-CM | POA: Diagnosis present

## 2017-12-05 DIAGNOSIS — J9811 Atelectasis: Secondary | ICD-10-CM | POA: Diagnosis not present

## 2017-12-05 DIAGNOSIS — R0603 Acute respiratory distress: Secondary | ICD-10-CM

## 2017-12-05 DIAGNOSIS — Z515 Encounter for palliative care: Secondary | ICD-10-CM | POA: Diagnosis present

## 2017-12-05 DIAGNOSIS — I959 Hypotension, unspecified: Secondary | ICD-10-CM | POA: Diagnosis not present

## 2017-12-05 DIAGNOSIS — A419 Sepsis, unspecified organism: Secondary | ICD-10-CM | POA: Diagnosis not present

## 2017-12-05 LAB — URINALYSIS, ROUTINE W REFLEX MICROSCOPIC
Bilirubin Urine: NEGATIVE
GLUCOSE, UA: NEGATIVE mg/dL
KETONES UR: 5 mg/dL — AB
NITRITE: POSITIVE — AB
PH: 6 (ref 5.0–8.0)
PROTEIN: 100 mg/dL — AB
Specific Gravity, Urine: 1.012 (ref 1.005–1.030)

## 2017-12-05 LAB — CBC WITH DIFFERENTIAL/PLATELET
ABS IMMATURE GRANULOCYTES: 0.05 10*3/uL (ref 0.00–0.07)
Basophils Absolute: 0.1 10*3/uL (ref 0.0–0.1)
Basophils Relative: 1 %
EOS PCT: 0 %
Eosinophils Absolute: 0 10*3/uL (ref 0.0–0.5)
HEMATOCRIT: 33.3 % — AB (ref 36.0–46.0)
HEMOGLOBIN: 9.6 g/dL — AB (ref 12.0–15.0)
Immature Granulocytes: 1 %
LYMPHS PCT: 12 %
Lymphs Abs: 1.1 10*3/uL (ref 0.7–4.0)
MCH: 26.4 pg (ref 26.0–34.0)
MCHC: 28.8 g/dL — AB (ref 30.0–36.0)
MCV: 91.5 fL (ref 80.0–100.0)
MONO ABS: 0.7 10*3/uL (ref 0.1–1.0)
Monocytes Relative: 7 %
NEUTROS ABS: 7.5 10*3/uL (ref 1.7–7.7)
Neutrophils Relative %: 79 %
Platelets: 449 10*3/uL — ABNORMAL HIGH (ref 150–400)
RBC: 3.64 MIL/uL — ABNORMAL LOW (ref 3.87–5.11)
RDW: 17.6 % — ABNORMAL HIGH (ref 11.5–15.5)
WBC: 9.4 10*3/uL (ref 4.0–10.5)
nRBC: 0 % (ref 0.0–0.2)

## 2017-12-05 LAB — COMPREHENSIVE METABOLIC PANEL
ALBUMIN: 2.7 g/dL — AB (ref 3.5–5.0)
ALT: 9 U/L (ref 0–44)
AST: 16 U/L (ref 15–41)
Alkaline Phosphatase: 58 U/L (ref 38–126)
Anion gap: 13 (ref 5–15)
BILIRUBIN TOTAL: 0.9 mg/dL (ref 0.3–1.2)
BUN: 17 mg/dL (ref 8–23)
CHLORIDE: 105 mmol/L (ref 98–111)
CO2: 20 mmol/L — ABNORMAL LOW (ref 22–32)
CREATININE: 1.53 mg/dL — AB (ref 0.44–1.00)
Calcium: 11.1 mg/dL — ABNORMAL HIGH (ref 8.9–10.3)
GFR calc Af Amer: 34 mL/min — ABNORMAL LOW (ref >60)
GFR, EST NON AFRICAN AMERICAN: 29 mL/min — AB (ref >60)
GLUCOSE: 100 mg/dL — AB (ref 70–99)
POTASSIUM: 4.5 mmol/L (ref 3.5–5.1)
Sodium: 138 mmol/L (ref 135–145)
Total Protein: 8 g/dL (ref 6.5–8.1)

## 2017-12-05 LAB — I-STAT CHEM 8, ED
BUN: 21 mg/dL (ref 8–23)
BUN: 19 mg/dL (ref 8–23)
CALCIUM ION: 1.09 mmol/L — AB (ref 1.15–1.40)
CALCIUM ION: 1.4 mmol/L (ref 1.15–1.40)
CHLORIDE: 108 mmol/L (ref 98–111)
Chloride: 111 mmol/L (ref 98–111)
Creatinine, Ser: 1.5 mg/dL — ABNORMAL HIGH (ref 0.44–1.00)
Creatinine, Ser: 1.3 mg/dL — ABNORMAL HIGH (ref 0.44–1.00)
Glucose, Bld: 100 mg/dL — ABNORMAL HIGH (ref 70–99)
Glucose, Bld: 110 mg/dL — ABNORMAL HIGH (ref 70–99)
HCT: 24 % — ABNORMAL LOW (ref 36.0–46.0)
HEMATOCRIT: 31 % — AB (ref 36.0–46.0)
Hemoglobin: 10.5 g/dL — ABNORMAL LOW (ref 12.0–15.0)
Hemoglobin: 8.2 g/dL — ABNORMAL LOW (ref 12.0–15.0)
Potassium: 4.4 mmol/L (ref 3.5–5.1)
Potassium: 4.1 mmol/L (ref 3.5–5.1)
SODIUM: 134 mmol/L — AB (ref 135–145)
SODIUM: 137 mmol/L (ref 135–145)
TCO2: 18 mmol/L — AB (ref 22–32)
TCO2: 22 mmol/L (ref 22–32)

## 2017-12-05 LAB — I-STAT CG4 LACTIC ACID, ED
Lactic Acid, Venous: 1.49 mmol/L (ref 0.5–1.9)
Lactic Acid, Venous: 0.63 mmol/L (ref 0.5–1.9)

## 2017-12-05 LAB — CBG MONITORING, ED: GLUCOSE-CAPILLARY: 89 mg/dL (ref 70–99)

## 2017-12-05 LAB — I-STAT TROPONIN, ED: Troponin i, poc: 0.01 ng/mL (ref 0.00–0.08)

## 2017-12-05 LAB — TROPONIN I: Troponin I: <0.03 ng/mL (ref <0.03)

## 2017-12-05 LAB — BRAIN NATRIURETIC PEPTIDE: B Natriuretic Peptide: 722.7 pg/mL — ABNORMAL HIGH (ref 0.0–100.0)

## 2017-12-05 MED ORDER — DEXTROSE-NACL 5-0.9 % IV SOLN
INTRAVENOUS | Status: AC
  Administered 2017-12-05 – 2017-12-10 (×3): via INTRAVENOUS

## 2017-12-05 MED ORDER — PANTOPRAZOLE SODIUM 40 MG PO TBEC
40.0000 mg | DELAYED_RELEASE_TABLET | Freq: Two times a day (BID) | ORAL | Status: DC
  Administered 2017-12-10 (×2): 40 mg via ORAL
  Filled 2017-12-05 (×6): qty 1

## 2017-12-05 MED ORDER — SODIUM CHLORIDE 0.9 % IV BOLUS
250.0000 mL | Freq: Once | INTRAVENOUS | Status: AC
  Administered 2017-12-05: 250 mL via INTRAVENOUS

## 2017-12-05 MED ORDER — CHOLECALCIFEROL 10 MCG (400 UNIT) PO TABS
400.0000 [IU] | ORAL_TABLET | Freq: Every day | ORAL | Status: DC
  Administered 2017-12-10: 400 [IU] via ORAL
  Filled 2017-12-05 (×7): qty 1

## 2017-12-05 MED ORDER — MIRTAZAPINE 7.5 MG PO TABS
7.5000 mg | ORAL_TABLET | Freq: Every day | ORAL | Status: DC
  Administered 2017-12-10: 7.5 mg via ORAL
  Filled 2017-12-05 (×6): qty 1

## 2017-12-05 MED ORDER — SENNA 8.6 MG PO TABS
1.0000 | ORAL_TABLET | Freq: Every day | ORAL | Status: DC
  Administered 2017-12-10: 8.6 mg via ORAL
  Filled 2017-12-05 (×3): qty 1

## 2017-12-05 MED ORDER — POLYETHYLENE GLYCOL 3350 17 G PO PACK
17.0000 g | PACK | Freq: Every day | ORAL | Status: DC
  Administered 2017-12-10: 17 g via ORAL
  Filled 2017-12-05 (×3): qty 1

## 2017-12-05 MED ORDER — SODIUM CHLORIDE 0.9 % IV SOLN
1.0000 g | Freq: Once | INTRAVENOUS | Status: AC
  Administered 2017-12-05: 1 g via INTRAVENOUS
  Filled 2017-12-05: qty 10

## 2017-12-05 MED ORDER — FERROUS SULFATE 220 (44 FE) MG/5ML PO ELIX
440.0000 mg | ORAL_SOLUTION | Freq: Every day | ORAL | Status: DC
  Filled 2017-12-05 (×2): qty 10

## 2017-12-05 MED ORDER — ISOSORBIDE MONONITRATE ER 30 MG PO TB24
30.0000 mg | ORAL_TABLET | Freq: Every day | ORAL | Status: DC
  Administered 2017-12-10 – 2017-12-14 (×5): 30 mg via ORAL
  Filled 2017-12-05 (×8): qty 1

## 2017-12-05 MED ORDER — ATORVASTATIN CALCIUM 20 MG PO TABS
40.0000 mg | ORAL_TABLET | Freq: Every day | ORAL | Status: DC
  Administered 2017-12-10: 40 mg via ORAL
  Filled 2017-12-05 (×2): qty 2

## 2017-12-05 MED ORDER — AMLODIPINE BESYLATE 5 MG PO TABS
10.0000 mg | ORAL_TABLET | Freq: Every day | ORAL | Status: DC
  Administered 2017-12-10: 10 mg via ORAL
  Filled 2017-12-05 (×5): qty 2

## 2017-12-05 MED ORDER — HEPARIN SODIUM (PORCINE) 5000 UNIT/ML IJ SOLN
5000.0000 [IU] | Freq: Three times a day (TID) | INTRAMUSCULAR | Status: AC
  Administered 2017-12-05 – 2017-12-06 (×4): 5000 [IU] via SUBCUTANEOUS
  Filled 2017-12-05 (×3): qty 1

## 2017-12-05 MED ORDER — SODIUM CHLORIDE 0.9 % IV SOLN
INTRAVENOUS | Status: DC
  Administered 2017-12-05: 13:00:00 via INTRAVENOUS

## 2017-12-05 MED ORDER — NITROGLYCERIN 0.4 MG SL SUBL
0.4000 mg | SUBLINGUAL_TABLET | SUBLINGUAL | Status: DC | PRN
  Administered 2018-01-11 – 2018-02-02 (×6): 0.4 mg via SUBLINGUAL
  Filled 2017-12-05 (×5): qty 1

## 2017-12-05 MED ORDER — INSULIN ASPART 100 UNIT/ML ~~LOC~~ SOLN
0.0000 [IU] | Freq: Three times a day (TID) | SUBCUTANEOUS | Status: DC
  Administered 2017-12-06: 5 [IU] via SUBCUTANEOUS
  Administered 2017-12-06 (×2): 3 [IU] via SUBCUTANEOUS
  Administered 2017-12-07: 1 [IU] via SUBCUTANEOUS
  Administered 2017-12-07: 3 [IU] via SUBCUTANEOUS
  Administered 2017-12-07 – 2017-12-09 (×6): 2 [IU] via SUBCUTANEOUS

## 2017-12-05 MED ORDER — ACETAMINOPHEN 500 MG PO TABS
500.0000 mg | ORAL_TABLET | Freq: Four times a day (QID) | ORAL | Status: DC | PRN
  Administered 2017-12-08 – 2017-12-31 (×14): 500 mg via ORAL
  Filled 2017-12-05 (×15): qty 1

## 2017-12-05 MED ORDER — INSULIN ASPART 100 UNIT/ML ~~LOC~~ SOLN: 0.0000 [IU] | Freq: Every day | SUBCUTANEOUS | Status: DC

## 2017-12-05 MED ORDER — METOPROLOL TARTRATE 25 MG PO TABS
75.0000 mg | ORAL_TABLET | Freq: Two times a day (BID) | ORAL | Status: DC
  Filled 2017-12-05 (×3): qty 3

## 2017-12-05 MED ORDER — SODIUM CHLORIDE 0.9 % IV SOLN
1.0000 g | INTRAVENOUS | Status: DC
  Administered 2017-12-06 – 2017-12-08 (×3): 1 g via INTRAVENOUS
  Filled 2017-12-05 (×3): qty 10

## 2017-12-05 NOTE — ED Provider Notes (Signed)
[facility] EMERGENCY DEPARTMENT Provider Note   CSN: [id number] Arrival date & time: 12/05/17  7902     History   Chief Complaint Chief Complaint  Patient presents with  . Altered Mental Status    HPI [name] is a 84 y.o. female.  Patient brought in by EMS.  Patient came in from [facility].  Patient was admitted the beginning of October for an evaluation of the ability not to eat or drink very well.  Also had chest pain part of that evaluation determined that she may have achalasia.  Received Botox treatment.  Patient had a history of stroke in the past.  Patient not eating or drinking well for the last several days according to the patient's son.  Last evening somewhere around 7 PM she was not speaking.  For the past few days she has had a gradually worsening mental status.  There was talk of placing a G-tube to help her with her nutrition when she was in the hospital before.  Patient is a full code.     Past Medical History:  Diagnosis Date  . Acute renal injury ([place]) 07/2016  . Anginal pain ([place]) 2004; 2011  . CAD (coronary artery disease)    status post stenting of the RCA status post CABG. (left internal mammary artery to  LAD, saphenous vein graft to second diagonal, saphenous vein  graft to obtuse marginal 1, saphenous vein graft to posterior  descending).  . Cardiomyopathy     Mildly reduced EF (45%).   . Chronic kidney disease (CKD), stage III (moderate) ([place])   . History of blood transfusion 1960s   "related to anemia"  . HTN (hypertension)   . Pleural effusion 2011   S/P "bypass"  . Shortness of breath   . Stroke ([place]) 09/19/2016   "left sided weakness" (09/20/2016)  . Type II diabetes mellitus [facility])     Patient Active Problem List   Diagnosis Date Noted  . Abnormal loss of weight   . Abnormal finding on GI tract imaging   . Nausea and vomiting   . Dysphagia   . Bilious vomiting with nausea   . Enlarged lymph node   . Pressure  injury of skin 11/05/2017  . Dehydration 11/04/2017  . Abdominal pain 11/04/2017  . Quadriceps strain, left, subsequent encounter 05/02/2017  . Rectal bleed 10/13/2016  . Malnutrition of moderate degree 10/13/2016  . Chest pain with moderate risk for cardiac etiology 10/06/2016  . Labile blood pressure   . Stage 3 chronic kidney disease ([place])   . AKI (acute kidney injury) ([place])   . Acute blood loss anemia   . Small vessel disease ([place]) 09/22/2016  . Hemiparesis affecting left side as late effect of cerebrovascular accident (CVA) ([place]) 09/22/2016  . Gait disturbance, post-stroke 09/22/2016  . Stroke (cerebrum) ([place]) 09/20/2016  . Diabetes mellitus with complication ([place])   . Ischemic stroke ([place])   . Status post intraocular lens implant 06/23/2011  . Glaucoma suspect 12/30/2010  . Macular hole 12/30/2010  . Nonproliferative diabetic retinopathy ([place]) 12/30/2010  . Syncope 06/17/2010  . CARDIOMYOPATHY 04/15/2010  . PLEURAL EFFUSION 04/14/2010  . SHORTNESS OF BREATH 04/14/2010  . CORONARY ATHEROSCLEROSIS NATIVE CORONARY ARTERY 01/19/2009  . Hyperlipidemia 01/06/2009  . Diabetes mellitus ([place]) 01/05/2009  . Essential hypertension 01/05/2009  . Coronary atherosclerosis 01/05/2009    Past Surgical History:  Procedure Laterality Date  . BOTOX INJECTION  11/20/2017   Procedure: BOTOX INJECTION;  Surgeon: Wilfrid Lund  L III, MD;  Location: [facility];  Service: Gastroenterology;;  . CATARACT EXTRACTION W/ INTRAOCULAR LENS  IMPLANT, BILATERAL Bilateral   . CORONARY ANGIOPLASTY WITH STENT PLACEMENT  2004   "LAD & one of the circumflex"  . CORONARY ARTERY BYPASS GRAFT  2011   CABG X4  . ESOPHAGOGASTRODUODENOSCOPY (EGD) WITH PROPOFOL N/A 11/20/2017   Procedure: ESOPHAGOGASTRODUODENOSCOPY (EGD) WITH PROPOFOL;  Surgeon: Doran Stabler, MD;  Location: [facility];  Service: Gastroenterology;  Laterality: N/A;     OB History   None      Home Medications    Prior to  Admission medications   Medication Sig Start Date End Date Taking? Authorizing Provider  acetaminophen (TYLENOL) 500 MG tablet Take 500 mg by mouth every 6 (six) hours as needed for headache.    Yes [provider]  amLODipine (NORVASC) 10 MG tablet TAKE ONE TABLET BY MOUTH ONCE DAILY. Patient taking differently: Take 10 mg by mouth daily.  07/27/17  Yes Minus Breeding, MD  atorvastatin (LIPITOR) 40 MG tablet Take 1 tablet (40 mg total) by mouth daily. Patient taking differently: Take 40 mg by mouth daily at 6 PM.  04/25/17  Yes Garvin Fila, MD  cholecalciferol (VITAMIN D) 400 units TABS tablet Take 1 tablet (400 Units total) by mouth daily. 10/06/16  Yes Angiulli, Lavon Paganini, PA-C  ferrous sulfate 220 (44 Fe) MG/5ML solution Take 10 mLs by mouth daily. 10/25/17  Yes [provider]  glipiZIDE (GLUCOTROL) 5 MG tablet Take 2 tablets (10 mg total) by mouth daily before breakfast. 11/24/17  Yes Nita Sells, MD  isosorbide mononitrate (IMDUR) 30 MG 24 hr tablet TAKE 1 TABLET BY MOUTH ONCE DAILY Patient taking differently: Take 30 mg by mouth daily.  09/22/17  Yes Minus Breeding, MD  megestrol (MEGACE) 20 MG tablet Take 20 mg by mouth at bedtime.   Yes [provider]  metoprolol tartrate 75 MG TABS Take 75 mg by mouth 2 (two) times daily. 11/24/17  Yes Nita Sells, MD  mirtazapine (REMERON) 15 MG tablet Take 7.5 mg by mouth at bedtime.   Yes [provider]  nitroGLYCERIN (NITROSTAT) 0.4 MG SL tablet Place 1 tablet (0.4 mg total) under the tongue every 5 (five) minutes x 3 doses as needed for chest pain. 10/08/16  Yes Rosita Fire, Brittainy M, PA-C  pantoprazole (PROTONIX) 40 MG tablet Take 1 tablet (40 mg total) by mouth 2 (two) times daily. 11/24/17  Yes Nita Sells, MD  polyethylene glycol (MIRALAX / GLYCOLAX) packet Take 17 g by mouth daily. 10/16/16  Yes Regalado, Belkys A, MD  senna (SENOKOT) 8.6 MG TABS tablet Take 1 tablet (8.6 mg total) by  mouth daily. Patient taking differently: Take 1 tablet by mouth at bedtime.  10/16/16  Yes Regalado, Belkys A, MD  feeding supplement, ENSURE ENLIVE, (ENSURE ENLIVE) LIQD Take 237 mLs by mouth 2 (two) times daily between meals. Patient not taking: Reported on 12/05/2017 11/24/17   Nita Sells, MD  megestrol (MEGACE) 400 MG/10ML suspension Take 10 mLs (400 mg total) by mouth daily. Patient not taking: Reported on 12/05/2017 11/24/17   Nita Sells, MD  protein supplement shake (PREMIER PROTEIN) LIQD Take 325 mLs (11 oz total) by mouth daily. Patient not taking: Reported on 11/04/2017 10/15/16   Elmarie Shiley, MD    Family History Family History  Problem Relation Age of Onset  . Diabetes Father 67  . Unexplained death Mother 33  . CAD Neg Hx  Social History Social History   Tobacco Use  . Smoking status: Never Smoker  . Smokeless tobacco: Never Used  Substance Use Topics  . Alcohol use: No  . Drug use: No     Allergies   Contrast media [iodinated diagnostic agents]   Review of Systems Review of Systems  Unable to perform ROS: Mental status change     Physical Exam Updated Vital Signs BP (!) 141/80 Comment: Simultaneous filing. User may not have seen previous data.  Pulse (!) 103   Temp 98.6 F (37 C) (Axillary)   Resp 17   SpO2 100%   Physical Exam  Constitutional:  Patient is on the thin side.  HENT:  Mucous membranes dry.  Eyes: Pupils are equal, round, and reactive to light. EOM are normal.  Neck: Neck supple.  Cardiovascular: Normal rate, regular rhythm and normal heart sounds.  Pulmonary/Chest: Effort normal and breath sounds normal. No respiratory distress.  Abdominal: Soft. Bowel sounds are normal. There is no tenderness.  Musculoskeletal: Normal range of motion. She exhibits no edema.  Neurological:  Patient somnolent.  Patient not speaking.  Skin: Skin is warm. Capillary refill takes less than 2 seconds.  Nursing note and  vitals reviewed.    ED Treatments / Results  Labs (all labs ordered are listed, but only abnormal results are displayed) Labs Reviewed  URINALYSIS, ROUTINE W REFLEX MICROSCOPIC - Abnormal; Notable for the following components:      Result Value   APPearance HAZY (*)    Hgb urine dipstick MODERATE (*)    Ketones, ur 5 (*)    Protein, ur 100 (*)    Nitrite POSITIVE (*)    Leukocytes, UA TRACE (*)    Bacteria, UA RARE (*)    All other components within normal limits  COMPREHENSIVE METABOLIC PANEL - Abnormal; Notable for the following components:   CO2 20 (*)    Glucose, Bld 100 (*)    Creatinine, Ser 1.53 (*)    Calcium 11.1 (*)    Albumin 2.7 (*)    GFR calc non Af Amer 29 (*)    GFR calc Af Amer 34 (*)    All other components within normal limits  CBC WITH DIFFERENTIAL/PLATELET - Abnormal; Notable for the following components:   RBC 3.64 (*)    Hemoglobin 9.6 (*)    HCT 33.3 (*)    MCHC 28.8 (*)    RDW 17.6 (*)    Platelets 449 (*)    All other components within normal limits  BRAIN NATRIURETIC PEPTIDE - Abnormal; Notable for the following components:   B Natriuretic Peptide 722.7 (*)    All other components within normal limits  I-STAT CHEM 8, ED - Abnormal; Notable for the following components:   Sodium 134 (*)    Creatinine, Ser 1.50 (*)    Glucose, Bld 100 (*)    Calcium, Ion 1.09 (*)    TCO2 18 (*)    Hemoglobin 10.5 (*)    HCT 31.0 (*)    All other components within normal limits  I-STAT CHEM 8, ED - Abnormal; Notable for the following components:   Creatinine, Ser 1.30 (*)    Glucose, Bld 110 (*)    Hemoglobin 8.2 (*)    HCT 24.0 (*)    All other components within normal limits  URINE CULTURE  TROPONIN I  CBG MONITORING, ED  I-STAT CG4 LACTIC ACID, ED  I-STAT TROPONIN, ED  I-STAT CG4 LACTIC ACID, ED  EKG EKG Interpretation  Date/Time:  Tuesday December 05 2017 09:14:04 EST Ventricular Rate:  101 PR Interval:    QRS Duration: 85 QT  Interval:  324 QTC Calculation: 420 R Axis:   -16 Text Interpretation:  Sinus tachycardia Borderline left axis deviation Consider anterior infarct Confirmed by Fredia Sorrow [phone]) on 12/05/2017 9:17:19 AM   Radiology Ct Head Wo Contrast  Result Date: 12/05/2017 CLINICAL DATA:  Altered level of consciousness. EXAM: CT HEAD WITHOUT CONTRAST TECHNIQUE: Contiguous axial images were obtained from the base of the skull through the vertex without intravenous contrast. COMPARISON:  CT scan of September 20, 2016. FINDINGS: Brain: Mild chronic ischemic white matter disease is noted. No mass effect or midline shift is noted. Ventricular size is within normal limits. There is no evidence of mass lesion, hemorrhage or acute infarction. Vascular: No hyperdense vessel or unexpected calcification. Skull: Normal. Negative for fracture or focal lesion. Sinuses/Orbits: No acute finding. Other: None. IMPRESSION: Mild chronic ischemic white matter disease. No acute intracranial abnormality seen. Electronically Signed   By: Marijo Conception, M.D.   On: 12/05/2017 12:03   Dg Chest Port 1 View  Result Date: 12/05/2017 CLINICAL DATA:  Altered mental status, difficulty with speech EXAM: PORTABLE CHEST 1 VIEW COMPARISON:  Portable chest x-ray of 11/09/2016 FINDINGS: No active infiltrate or effusion is seen. Mediastinal and hilar contours are unremarkable. Mild cardiomegaly is stable as is the ectatic descending thoracic aorta. Posttraumatic degenerative change of the right humeral neck is again noted. IMPRESSION: 1. No active lung disease. 2. Stable mild cardiomegaly. Electronically Signed   By: Ivar Drape M.D.   On: 12/05/2017 10:00    Procedures Procedures (including critical care time)  Medications Ordered in ED Medications  0.9 %  sodium chloride infusion ( Intravenous Bolus from Bag 12/05/17 1246)  sodium chloride 0.9 % bolus 250 mL (250 mLs Intravenous Bolus 12/05/17 1050)  cefTRIAXone (ROCEPHIN) 1 g in sodium  chloride 0.9 % 100 mL IVPB (1 g Intravenous New Bag/Given 12/05/17 1552)     Initial Impression / Assessment and Plan / ED Course  I have reviewed the triage vital signs and the nursing notes.  Pertinent labs & imaging results that were available during my care of the patient were reviewed by me and considered in my medical decision making (see chart for details).     Patient with persistent difficulty eating and drinking according to her family member.  She has been at [facility].  Since her discharge.  Patient was admitted on October 5.  Patient was having difficulty swallowing.  They did an upper endoscopy after they ruled out kind of chest problems.  And they did make determination achalasia.  Gave her some Botox.  Apparently patient started to eat and drink a little bit better.  But son states shortly after getting back to [facility] only a couple days or so patient would not eat or drink.  Patient is a full code she is right-hand dominant.  She had no speech noted by the son this morning.  Son sort of highlights this to being around 7 in the PM yesterday.  And it persisted till this morning.  And also associated with some confusion is been going on for a few days.  But the speech problems as stated just started last evening.   Patient after she been here for a while did start to speak some.  Work-up shows evidence of urinary tract infection.  The patient still has sort of  the failure to thrive not wanting to eat or drink well.  MRI has been ordered and is pending due to the inability to talk.  The patient does require readmission by the medicine service at the very least for the urinary tract infection and for the failure to thrive.  Patient's urinalysis is stated showed urinary tract infection.  Culture sent and patient started on Rocephin for this.  Dr. Lorin Mercy was contacted from the [facility] but when she came to see the patient patient was an MRI so she wants hospitalist  service we contacted when she comes back from MRI.  [name] was very helpful in providing information.  He is a physician.  Patient more awake than when she came in.  She will speak some.  But not able to follow all commands.  Seems to be moving all extremities.  Patient has had a history of stroke in the past. Final Clinical Impressions(s) / ED Diagnoses   Final diagnoses:  Altered mental status, unspecified altered mental status type  Acute cystitis without hematuria    ED Discharge Orders    None       Fredia Sorrow, MD 12/05/17 [id number]

## 2017-12-05 NOTE — ED Notes (Signed)
Pt remains in MRI at this time  

## 2017-12-05 NOTE — ED Notes (Signed)
Delay in labs being drawn due to pt being a hard stick. RN unable to get blood during IV stick. Phlebotomy tried twice, another phlebotomist at bedside. RN unsuccessful. MD made aware of delay. Pt is now awake is verbally responsive.

## 2017-12-05 NOTE — Progress Notes (Signed)
    [name] IRW:[id number] DOB: 1927/11/01 DOA: 12/05/2017  PCP: Deland Pretty, MD Patient coming from: [facility]; NOK: Son - physician  Chief Complaint: AMS  HPI: [name] is a 84 y.o. female with medical history significant of DM; CVA (2018); CAD s/p stent; HTN; stage 3 CKD; and systolic CHF presenting with AMS.   ED Course:  Admitted recently.  Since back at SNF, FTT and achalasia.  This AM, she was aphasic.  Now with improved MS.  Appears to have UTI, given Rocephin.  MRI pending.   Radiological Exams on Admission: Ct Head Wo Contrast  Result Date: 12/05/2017 CLINICAL DATA:  Altered level of consciousness. EXAM: CT HEAD WITHOUT CONTRAST TECHNIQUE: Contiguous axial images were obtained from the base of the skull through the vertex without intravenous contrast. COMPARISON:  CT scan of September 20, 2016. FINDINGS: Brain: Mild chronic ischemic white matter disease is noted. No mass effect or midline shift is noted. Ventricular size is within normal limits. There is no evidence of mass lesion, hemorrhage or acute infarction. Vascular: No hyperdense vessel or unexpected calcification. Skull: Normal. Negative for fracture or focal lesion. Sinuses/Orbits: No acute finding. Other: None. IMPRESSION: Mild chronic ischemic white matter disease. No acute intracranial abnormality seen. Electronically Signed   By: Marijo Conception, M.D.   On: 12/05/2017 12:03   Dg Chest Port 1 View  Result Date: 12/05/2017 CLINICAL DATA:  Altered mental status, difficulty with speech EXAM: PORTABLE CHEST 1 VIEW COMPARISON:  Portable chest x-ray of 11/09/2016 FINDINGS: No active infiltrate or effusion is seen. Mediastinal and hilar contours are unremarkable. Mild cardiomegaly is stable as is the ectatic descending thoracic aorta. Posttraumatic degenerative change of the right humeral neck is again noted. IMPRESSION: 1. No active lung disease. 2. Stable mild cardiomegaly. Electronically Signed   By: Ivar Drape M.D.    On: 12/05/2017 10:00    EKG: Independently reviewed.  Sinus tachycardia with rate 101; nonspecific ST changes with no evidence of acute ischemia   Labs on Admission: I have personally reviewed the available labs and imaging studies at the time of the admission.  Pertinent labs:   UA: moderate Hgb, 5 ketones, trace LE, positive nitrite, 100 protein, rare bacteria CO2 20 Glucose 100 BUN 17/Creatinine 1.53/GFR 29 Albumin 2.7 Hgb 9.6 BNP 772.7 Troponin <0.03  I went to personally evaluate the patient and her son reported that she is in MRI.  Since this test takes an hour, I have requested that the ER physician have the patient re-paged after this study.  The patient is not currently able to be evaluated.    Karmen Bongo MD [facility]  If note is complete, please contact covering daytime or nighttime physician. [email or web address] Password [email or web address]  12/05/2017, 3:51 PM

## 2017-12-05 NOTE — ED Notes (Signed)
Urine culture sent down with urine sample prior when in and out cath completed.

## 2017-12-05 NOTE — ED Notes (Signed)
Unable to stick pt..no blood return

## 2017-12-05 NOTE — ED Notes (Signed)
Purewick in place and hooked up to suction canister.

## 2017-12-05 NOTE — ED Triage Notes (Addendum)
Per ems- pt comes from [facility], her son reports speech problems since 1900 last night. Was having stuttering, trouble getting her words out. This morning pt not having any verbal response, she will open her eyes when calling her name, but not following any other commands. Her plavix was stopped recently due to problems swallowing. CBG 115, BP 166/87, HR 89. Unable to get an IV. Has hx of CVA in august, with minimal deficits. At baseline she is alert, verbally responsive. Her son is at bedside and is a good historian.

## 2017-12-05 NOTE — ED Notes (Signed)
Pt to MRI at this time.

## 2017-12-05 NOTE — H&P (Signed)
History and Physical    [name] HWE:[id number] DOB: April 24, 1927 DOA: 12/05/2017  PCP: Deland Pretty, MD Consultants:  none Patient coming from: SNF  Chief Complaint: AMS  HPI: [name] is a 84 y.o. female with medical history significant for DM2, HTN, HLD, CAD s/p CABG, h/o CVA, CKD3, who was recently diagnosed with achalasia after a 50 lb weight loss over the past year. She was treated with botox earlier in October and sent to SNF with the hopes she would be able to swallow her food. She has not done well since that time, has only been able to drink small amounts of milk. Son is a Management consultant in [place] and has been visiting. Yesterday he noticed she was stuttering, having difficulty getting words out, and progressed until she seemed to have total aphasia last night. She does report having dysuria and some lower abd pain. No N/V. Has a good appetite but avoids food because of the obstruction. Megace and remeron have not helped. Process of being worked up for G tube has been initiated and pt reportedly has appt with [facility] tomorrow morning. Son feels like her weight loss and FTT could be reversible if she gets a G tube as she was reportedly very functional a month ago, walking with a walker and performing all of her ADLs.   ED Course: Given IVF, U/A showed UTI. MRI neg for acute CVA. Creat 1.5 --> 1.3 with IVF over the course of the day. Sinus tachycardia.  Review of Systems: As per HPI; otherwise review of systems reviewed and negative.   Ambulatory Status:  Ambulated with walker as recently as a month ago, now not ambulatory  Past Medical History:  Diagnosis Date  . Acute renal injury ([place]) 07/2016  . Anginal pain ([place]) 2004; 2011  . CAD (coronary artery disease)    status post stenting of the RCA status post CABG. (left internal mammary artery to  LAD, saphenous vein graft to second diagonal, saphenous vein  graft to obtuse marginal 1, saphenous vein graft to posterior   descending).  . Cardiomyopathy     Mildly reduced EF (45%).   . Chronic kidney disease (CKD), stage III (moderate) ([place])   . History of blood transfusion 1960s   "related to anemia"  . HTN (hypertension)   . Pleural effusion 2011   S/P "bypass"  . Shortness of breath   . Stroke ([place]) 09/19/2016   "left sided weakness" (09/20/2016)  . Type II diabetes mellitus ([place])     Past Surgical History:  Procedure Laterality Date  . BOTOX INJECTION  11/20/2017   Procedure: BOTOX INJECTION;  Surgeon: Doran Stabler, MD;  Location: [facility];  Service: Gastroenterology;;  . CATARACT EXTRACTION W/ INTRAOCULAR LENS  IMPLANT, BILATERAL Bilateral   . CORONARY ANGIOPLASTY WITH STENT PLACEMENT  2004   "LAD & one of the circumflex"  . CORONARY ARTERY BYPASS GRAFT  2011   CABG X4  . ESOPHAGOGASTRODUODENOSCOPY (EGD) WITH PROPOFOL N/A 11/20/2017   Procedure: ESOPHAGOGASTRODUODENOSCOPY (EGD) WITH PROPOFOL;  Surgeon: Doran Stabler, MD;  Location: [facility];  Service: Gastroenterology;  Laterality: N/A;    Social History   Socioeconomic History  . Marital status: Married    Spouse name: Not on file  . Number of children: 2  . Years of education: Not on file  . Highest education level: Not on file  Occupational History  . Occupation: Retired  Scientific laboratory technician  . Financial resource strain: Not on file  .  Food insecurity:    Worry: Not on file    Inability: Not on file  . Transportation needs:    Medical: Not on file    Non-medical: Not on file  Tobacco Use  . Smoking status: Never Smoker  . Smokeless tobacco: Never Used  Substance and Sexual Activity  . Alcohol use: No  . Drug use: No  . Sexual activity: Not Currently  Lifestyle  . Physical activity:    Days per week: Not on file    Minutes per session: Not on file  . Stress: Not on file  Relationships  . Social connections:    Talks on phone: Not on file    Gets together: Not on file    Attends religious service: Not on  file    Active member of club or organization: Not on file    Attends meetings of clubs or organizations: Not on file    Relationship status: Not on file  . Intimate partner violence:    Fear of current or ex partner: Not on file    Emotionally abused: Not on file    Physically abused: Not on file    Forced sexual activity: Not on file  Other Topics Concern  . Not on file  Social History Narrative  . Not on file    Allergies  Allergen Reactions  . Contrast Media [Iodinated Diagnostic Agents]     Sensitivity, kidney issues    Family History  Problem Relation Age of Onset  . Diabetes Father 3  . Unexplained death Mother 32  . CAD Neg Hx     Prior to Admission medications   Medication Sig Start Date End Date Taking? Authorizing Provider  acetaminophen (TYLENOL) 500 MG tablet Take 500 mg by mouth every 6 (six) hours as needed for headache.    Yes [provider]  amLODipine (NORVASC) 10 MG tablet TAKE ONE TABLET BY MOUTH ONCE DAILY. Patient taking differently: Take 10 mg by mouth daily.  07/27/17  Yes Minus Breeding, MD  atorvastatin (LIPITOR) 40 MG tablet Take 1 tablet (40 mg total) by mouth daily. Patient taking differently: Take 40 mg by mouth daily at 6 PM.  04/25/17  Yes Garvin Fila, MD  cholecalciferol (VITAMIN D) 400 units TABS tablet Take 1 tablet (400 Units total) by mouth daily. 10/06/16  Yes Angiulli, Lavon Paganini, PA-C  ferrous sulfate 220 (44 Fe) MG/5ML solution Take 10 mLs by mouth daily. 10/25/17  Yes [provider]  glipiZIDE (GLUCOTROL) 5 MG tablet Take 2 tablets (10 mg total) by mouth daily before breakfast. 11/24/17  Yes Nita Sells, MD  isosorbide mononitrate (IMDUR) 30 MG 24 hr tablet TAKE 1 TABLET BY MOUTH ONCE DAILY Patient taking differently: Take 30 mg by mouth daily.  09/22/17  Yes Minus Breeding, MD  megestrol (MEGACE) 20 MG tablet Take 20 mg by mouth at bedtime.   Yes [provider]  metoprolol tartrate 75 MG TABS  Take 75 mg by mouth 2 (two) times daily. 11/24/17  Yes Nita Sells, MD  mirtazapine (REMERON) 15 MG tablet Take 7.5 mg by mouth at bedtime.   Yes [provider]  nitroGLYCERIN (NITROSTAT) 0.4 MG SL tablet Place 1 tablet (0.4 mg total) under the tongue every 5 (five) minutes x 3 doses as needed for chest pain. 10/08/16  Yes Rosita Fire, Brittainy M, PA-C  pantoprazole (PROTONIX) 40 MG tablet Take 1 tablet (40 mg total) by mouth 2 (two) times daily. 11/24/17  Yes Nita Sells, MD  polyethylene glycol (MIRALAX / GLYCOLAX) packet Take 17 g by mouth daily. 10/16/16  Yes Regalado, Belkys A, MD  senna (SENOKOT) 8.6 MG TABS tablet Take 1 tablet (8.6 mg total) by mouth daily. Patient taking differently: Take 1 tablet by mouth at bedtime.  10/16/16  Yes Regalado, Belkys A, MD  feeding supplement, ENSURE ENLIVE, (ENSURE ENLIVE) LIQD Take 237 mLs by mouth 2 (two) times daily between meals. Patient not taking: Reported on 12/05/2017 11/24/17   Nita Sells, MD  megestrol (MEGACE) 400 MG/10ML suspension Take 10 mLs (400 mg total) by mouth daily. Patient not taking: Reported on 12/05/2017 11/24/17   Nita Sells, MD  protein supplement shake (PREMIER PROTEIN) LIQD Take 325 mLs (11 oz total) by mouth daily. Patient not taking: Reported on 11/04/2017 10/15/16   Elmarie Shiley, MD    Physical Exam: Vitals:   12/05/17 1500 12/05/17 1515 12/05/17 1530 12/05/17 1545  BP: 139/76 (!) 149/73 (!) 151/75 (!) 141/80  Pulse:      Resp: 12 16 11 17   Temp:      TempSrc:      SpO2:         . General: Elderly thin female, appears calm and comfortable and is in NAD . Eyes:  PERRL, EOMI, normal lids, iris . ENT:  grossly normal hearing, dry lips & tongue, dry mmm; upper and lower dentures . Neck:  no LAD, masses or thyromegaly; no carotid bruits . Cardiovascular: tachy, reg rhythm, no murmur. Marland Kitchen Respiratory:   CTA bilaterally with no wheezes/rales/rhonchi.  Normal respiratory  effort. . Abdomen:  Thin, soft, mildly TTP suprapubic area, ND, NABS . Back:   grossly normal alignment, no CVAT . Skin:  no rash or induration seen on limited exam . Musculoskeletal: diffuse mild muscle atrophy; good ROM, no bony abnormality or obvious joint deformity . Lower extremity:  No LE edema.  Limited foot exam with no ulcerations.  2+ distal pulses. Marland Kitchen Psychiatric:  grossly normal mood and affect, speech fluent and appropriate, AOx3 . Neurologic:  CN 2-12 grossly intact. She can speak but she is dysarthric and difficult to understand. She is not able to follow all commands but recognizes her son and calls him by name. MAEW.    Radiological Exams on Admission: Ct Head Wo Contrast  Result Date: 12/05/2017 CLINICAL DATA:  Altered level of consciousness. EXAM: CT HEAD WITHOUT CONTRAST TECHNIQUE: Contiguous axial images were obtained from the base of the skull through the vertex without intravenous contrast. COMPARISON:  CT scan of September 20, 2016. FINDINGS: Brain: Mild chronic ischemic white matter disease is noted. No mass effect or midline shift is noted. Ventricular size is within normal limits. There is no evidence of mass lesion, hemorrhage or acute infarction. Vascular: No hyperdense vessel or unexpected calcification. Skull: Normal. Negative for fracture or focal lesion. Sinuses/Orbits: No acute finding. Other: None. IMPRESSION: Mild chronic ischemic white matter disease. No acute intracranial abnormality seen. Electronically Signed   By: Marijo Conception, M.D.   On: 12/05/2017 12:03   Mr Brain Wo Contrast (neuro Protocol)  Result Date: 12/05/2017 CLINICAL DATA:  84 year old female with altered mental status. EXAM: MRI HEAD WITHOUT CONTRAST TECHNIQUE: Multiplanar, multiecho pulse sequences of the brain and surrounding structures were obtained without intravenous contrast. COMPARISON:  Head CT without contrast 1140 hours today. Brain MRI 09/20/2016. FINDINGS: Brain: No restricted  diffusion to suggest acute infarction. No midline shift, mass effect, evidence of mass lesion, ventriculomegaly, extra-axial collection or acute intracranial hemorrhage. Cervicomedullary junction and  pituitary are within normal limits. Chronic lacunar infarcts in the brainstem and bilateral deep gray matter nuclei. Chronic confluent bilateral cerebral white matter T2 and FLAIR hyperintensity. Chronic small area of cortical encephalomalacia at the right motor strip. Chronic microhemorrhage in the lateral right thalamus. No new signal abnormality.  Stable cerebral volume since 2018. Vascular: Major intracranial vascular flow voids are stable since 2018. Skull and upper cervical spine: Negative visible cervical spine. Visualized bone marrow signal is within normal limits. Sinuses/Orbits: Stable and negative. Other: Mastoids remain clear. Visible internal auditory structures appear normal. Scalp and face soft tissues appear negative. IMPRESSION: 1.  No acute intracranial abnormality. 2. Advanced chronic ischemic disease appears stable since 2018. Electronically Signed   By: Genevie Ann M.D.   On: 12/05/2017 17:00   Dg Chest Port 1 View  Result Date: 12/05/2017 CLINICAL DATA:  Altered mental status, difficulty with speech EXAM: PORTABLE CHEST 1 VIEW COMPARISON:  Portable chest x-ray of 11/09/2016 FINDINGS: No active infiltrate or effusion is seen. Mediastinal and hilar contours are unremarkable. Mild cardiomegaly is stable as is the ectatic descending thoracic aorta. Posttraumatic degenerative change of the right humeral neck is again noted. IMPRESSION: 1. No active lung disease. 2. Stable mild cardiomegaly. Electronically Signed   By: Ivar Drape M.D.   On: 12/05/2017 10:00    EKG: Independently reviewed.  NSR with rate 101 Sinus tachycardia Borderline left axis deviation   Labs on Admission: I have personally reviewed the available labs and imaging studies at the time of the admission.  Pertinent labs:   Sodium 1/5 38 potassium 4.5 chloride 105 CO2 20 glucose 100 BUN 17 creatinine 1.53 which trended down to 1.3 calcium 11.1 alkaline phosphatase 58 albumin 2.7 AST 16 ALT 9 total protein 8 total bilirubin 0.9 BNP 722.7 Troponin less than 0.03 Lactic acid 0.49 which trended to 0.63 Urinalysis: Specific gravity 1.012, pH 6.0, positive nitrite, trace leukocyte, moderate hemoglobin, protein 100, 11-20 white blood cells, 0-5 RBC, rare bacteria  Assessment/Plan Principal Problem:   Dysarthria Active Problems:   Diabetes mellitus ([place])   Hyperlipidemia   Essential hypertension   Coronary atherosclerosis   Stroke (cerebrum) ([facility])   Stage 3 chronic kidney disease ([place])   Acute lower UTI   Failure to thrive in adult   Dysarthria -Likely 2/2 UTI. MRI is negative for CVA, nonfocal neuro exam -treat UTI with rocephin -f/u UCX and tailor abx as appropriate -cont to monitor mental status  Achalasia with 50 lb weight loss in the past year -failed trial of botox -will need GI consult tomorrow a.m. - pt was to have GI appt at that time but will have to miss.  -for now, keep NPO, continue maintenance IVF  DM2 -SSI -carb modified heart healthy diet  HLD -cont lipitor  HTN -cont norvasc, imdur, metoprolol  CAD -cont norvasc, imdur, metoprolol, statin  H/o CVA, MRI neg for acute MRI here -cont statin  CKD III -creatinine is at baseline -renally dose meds -avoid nephrotoxins -daily BMP   DVT prophylaxis: heparin [place] Code Status:  Full - confirmed with patient/family Family Communication: son Erick Alley, MD) at bedside  Disposition Plan: SNF once clinically improved Consults called: PT/OT, nutrition  Admission status: It is my clinical opinion that referral for OBSERVATION is reasonable and necessary in this patient based on the above information provided. The aforementioned taken together are felt to place the patient at high risk for further clinical deterioration. However it is  anticipated that the patient may be medically  stable for discharge from the hospital within 24 to 48 hours.     Janora Norlander MD [facility]  If note is complete, please contact covering daytime or nighttime physician. [email or web address] Password [facility]  12/05/2017, 6:13 PM

## 2017-12-05 NOTE — ED Notes (Signed)
Pt CBG 89. Notified Sarah, Therapist, sports.

## 2017-12-06 ENCOUNTER — Ambulatory Visit: Payer: Medicare Other | Admitting: Nurse Practitioner

## 2017-12-06 DIAGNOSIS — N3000 Acute cystitis without hematuria: Secondary | ICD-10-CM

## 2017-12-06 DIAGNOSIS — N390 Urinary tract infection, site not specified: Secondary | ICD-10-CM | POA: Diagnosis not present

## 2017-12-06 DIAGNOSIS — E1159 Type 2 diabetes mellitus with other circulatory complications: Secondary | ICD-10-CM | POA: Diagnosis not present

## 2017-12-06 DIAGNOSIS — R471 Dysarthria and anarthria: Secondary | ICD-10-CM | POA: Diagnosis not present

## 2017-12-06 LAB — GLUCOSE, CAPILLARY
Glucose-Capillary: 80 mg/dL (ref 70–99)
Glucose-Capillary: 231 mg/dL — ABNORMAL HIGH (ref 70–99)
Glucose-Capillary: 264 mg/dL — ABNORMAL HIGH (ref 70–99)
Glucose-Capillary: 204 mg/dL — ABNORMAL HIGH (ref 70–99)
Glucose-Capillary: 164 mg/dL — ABNORMAL HIGH (ref 70–99)

## 2017-12-06 LAB — BASIC METABOLIC PANEL
Anion gap: 11 (ref 5–15)
BUN: 16 mg/dL (ref 8–23)
CO2: 17 mmol/L — ABNORMAL LOW (ref 22–32)
Calcium: 10.3 mg/dL (ref 8.9–10.3)
Chloride: 109 mmol/L (ref 98–111)
Creatinine, Ser: 1.41 mg/dL — ABNORMAL HIGH (ref 0.44–1.00)
GFR calc Af Amer: 37 mL/min — ABNORMAL LOW (ref >60)
GFR calc non Af Amer: 32 mL/min — ABNORMAL LOW (ref >60)
Glucose, Bld: 254 mg/dL — ABNORMAL HIGH (ref 70–99)
Potassium: 4.4 mmol/L (ref 3.5–5.1)
Sodium: 137 mmol/L (ref 135–145)

## 2017-12-06 LAB — CBC
HCT: 28.3 % — ABNORMAL LOW (ref 36.0–46.0)
Hemoglobin: 8.5 g/dL — ABNORMAL LOW (ref 12.0–15.0)
MCH: 26.6 pg (ref 26.0–34.0)
MCHC: 30 g/dL (ref 30.0–36.0)
MCV: 88.7 fL (ref 80.0–100.0)
Platelets: 470 10*3/uL — ABNORMAL HIGH (ref 150–400)
RBC: 3.19 MIL/uL — ABNORMAL LOW (ref 3.87–5.11)
RDW: 17.7 % — ABNORMAL HIGH (ref 11.5–15.5)
WBC: 8.3 10*3/uL (ref 4.0–10.5)
nRBC: 0 % (ref 0.0–0.2)

## 2017-12-06 LAB — TROPONIN I: TROPONIN I: 0.03 ng/mL — AB (ref <0.03)

## 2017-12-06 LAB — MRSA PCR SCREENING: MRSA by PCR: POSITIVE — AB

## 2017-12-06 MED ORDER — HYDRALAZINE HCL 20 MG/ML IJ SOLN
5.0000 mg | Freq: Four times a day (QID) | INTRAMUSCULAR | Status: DC | PRN
  Administered 2017-12-06 – 2018-01-01 (×3): 5 mg via INTRAVENOUS
  Filled 2017-12-06 (×3): qty 1

## 2017-12-06 MED ORDER — CEFAZOLIN SODIUM-DEXTROSE 2-4 GM/100ML-% IV SOLN: 2.0000 g | INTRAVENOUS | Status: AC

## 2017-12-06 MED ORDER — FERROUS SULFATE 300 (60 FE) MG/5ML PO SYRP
300.0000 mg | ORAL_SOLUTION | Freq: Every day | ORAL | Status: DC
  Administered 2017-12-10: 300 mg via ORAL
  Filled 2017-12-06 (×6): qty 5

## 2017-12-06 MED ORDER — ADULT MULTIVITAMIN W/MINERALS CH
1.0000 | ORAL_TABLET | Freq: Every day | ORAL | Status: DC
  Administered 2017-12-10: 1 via ORAL
  Filled 2017-12-06 (×3): qty 1

## 2017-12-06 MED ORDER — METOPROLOL TARTRATE 5 MG/5ML IV SOLN
2.5000 mg | Freq: Four times a day (QID) | INTRAVENOUS | Status: DC
  Administered 2017-12-06 – 2017-12-11 (×20): 2.5 mg via INTRAVENOUS
  Filled 2017-12-06 (×19): qty 5

## 2017-12-06 NOTE — Consult Note (Signed)
Chief Complaint: Patient was seen in consultation today for percutaneous gastric tube placement Chief Complaint  Patient presents with  . Altered Mental Status   at the request of Dr Venia Minks   Supervising Physician: Marybelle Killings  Patient Status: [facility] - [facility]  History of Present Illness: [name] is a 84 y.o. female   DM; HTN; HLD; CAD/CABG; CVA; CKD3 Achalasia 50 lb wt loss Treated with Botox in October-- failed treatment  Admitted for Failure to thrive AMS Gradually worsening debilitation Continued wt loss Malnutrition   Request for percutaneous gastric tube placement Imaging reviewed and approved per Dr Barbie Banner  Past Medical History:  Diagnosis Date  . Acute renal injury ([place]) 07/2016  . Anginal pain ([place]) 2004; 2011  . CAD (coronary artery disease)    status post stenting of the RCA status post CABG. (left internal mammary artery to  LAD, saphenous vein graft to second diagonal, saphenous vein  graft to obtuse marginal 1, saphenous vein graft to posterior  descending).  . Cardiomyopathy     Mildly reduced EF (45%).   . Chronic kidney disease (CKD), stage III (moderate) ([place])   . History of blood transfusion 1960s   "related to anemia"  . HTN (hypertension)   . Pleural effusion 2011   S/P "bypass"  . Shortness of breath   . Stroke ([place]) 09/19/2016   "left sided weakness" (09/20/2016)  . Type II diabetes mellitus ([place])     Past Surgical History:  Procedure Laterality Date  . BOTOX INJECTION  11/20/2017   Procedure: BOTOX INJECTION;  Surgeon: Doran Stabler, MD;  Location: [facility];  Service: Gastroenterology;;  . CATARACT EXTRACTION W/ INTRAOCULAR LENS  IMPLANT, BILATERAL Bilateral   . CORONARY ANGIOPLASTY WITH STENT PLACEMENT  2004   "LAD & one of the circumflex"  . CORONARY ARTERY BYPASS GRAFT  2011   CABG X4  . ESOPHAGOGASTRODUODENOSCOPY (EGD) WITH PROPOFOL N/A 11/20/2017   Procedure: ESOPHAGOGASTRODUODENOSCOPY (EGD) WITH PROPOFOL;   Surgeon: Doran Stabler, MD;  Location: [facility];  Service: Gastroenterology;  Laterality: N/A;    Allergies: Contrast media [iodinated diagnostic agents]  Medications: Prior to Admission medications   Medication Sig Start Date End Date Taking? Authorizing Provider  acetaminophen (TYLENOL) 500 MG tablet Take 500 mg by mouth every 6 (six) hours as needed for headache.    Yes [provider]  amLODipine (NORVASC) 10 MG tablet TAKE ONE TABLET BY MOUTH ONCE DAILY. Patient taking differently: Take 10 mg by mouth daily.  07/27/17  Yes Minus Breeding, MD  atorvastatin (LIPITOR) 40 MG tablet Take 1 tablet (40 mg total) by mouth daily. Patient taking differently: Take 40 mg by mouth daily at 6 PM.  04/25/17  Yes Garvin Fila, MD  cholecalciferol (VITAMIN D) 400 units TABS tablet Take 1 tablet (400 Units total) by mouth daily. 10/06/16  Yes Angiulli, Lavon Paganini, PA-C  ferrous sulfate 220 (44 Fe) MG/5ML solution Take 10 mLs by mouth daily. 10/25/17  Yes [provider]  glipiZIDE (GLUCOTROL) 5 MG tablet Take 2 tablets (10 mg total) by mouth daily before breakfast. 11/24/17  Yes Nita Sells, MD  isosorbide mononitrate (IMDUR) 30 MG 24 hr tablet TAKE 1 TABLET BY MOUTH ONCE DAILY Patient taking differently: Take 30 mg by mouth daily.  09/22/17  Yes Minus Breeding, MD  megestrol (MEGACE) 20 MG tablet Take 20 mg by mouth at bedtime.   Yes [provider]  metoprolol tartrate 75 MG TABS Take 75 mg  by mouth 2 (two) times daily. 11/24/17  Yes Nita Sells, MD  mirtazapine (REMERON) 15 MG tablet Take 7.5 mg by mouth at bedtime.   Yes [provider]  nitroGLYCERIN (NITROSTAT) 0.4 MG SL tablet Place 1 tablet (0.4 mg total) under the tongue every 5 (five) minutes x 3 doses as needed for chest pain. 10/08/16  Yes Rosita Fire, Brittainy M, PA-C  pantoprazole (PROTONIX) 40 MG tablet Take 1 tablet (40 mg total) by mouth 2 (two) times daily. 11/24/17  Yes Nita Sells, MD  polyethylene glycol (MIRALAX / GLYCOLAX) packet Take 17 g by mouth daily. 10/16/16  Yes Regalado, Belkys A, MD  senna (SENOKOT) 8.6 MG TABS tablet Take 1 tablet (8.6 mg total) by mouth daily. Patient taking differently: Take 1 tablet by mouth at bedtime.  10/16/16  Yes Regalado, Belkys A, MD  feeding supplement, ENSURE ENLIVE, (ENSURE ENLIVE) LIQD Take 237 mLs by mouth 2 (two) times daily between meals. Patient not taking: Reported on 12/05/2017 11/24/17   Nita Sells, MD  megestrol (MEGACE) 400 MG/10ML suspension Take 10 mLs (400 mg total) by mouth daily. Patient not taking: Reported on 12/05/2017 11/24/17   Nita Sells, MD  protein supplement shake (PREMIER PROTEIN) LIQD Take 325 mLs (11 oz total) by mouth daily. Patient not taking: Reported on 11/04/2017 10/15/16   Elmarie Shiley, MD     Family History  Problem Relation Age of Onset  . Diabetes Father 97  . Unexplained death Mother 68  . CAD Neg Hx     Social History   Socioeconomic History  . Marital status: Married    Spouse name: Not on file  . Number of children: 2  . Years of education: Not on file  . Highest education level: Not on file  Occupational History  . Occupation: Retired  Scientific laboratory technician  . Financial resource strain: Not on file  . Food insecurity:    Worry: Not on file    Inability: Not on file  . Transportation needs:    Medical: Not on file    Non-medical: Not on file  Tobacco Use  . Smoking status: Never Smoker  . Smokeless tobacco: Never Used  Substance and Sexual Activity  . Alcohol use: No  . Drug use: No  . Sexual activity: Not Currently  Lifestyle  . Physical activity:    Days per week: Not on file    Minutes per session: Not on file  . Stress: Not on file  Relationships  . Social connections:    Talks on phone: Not on file    Gets together: Not on file    Attends religious service: Not on file    Active member of club or organization: Not on file     Attends meetings of clubs or organizations: Not on file    Relationship status: Not on file  Other Topics Concern  . Not on file  Social History Narrative  . Not on file   Review of Systems: A 12 point ROS discussed and pertinent positives are indicated in the HPI above.  All other systems are negative.  Review of Systems  Constitutional: Positive for appetite change and unexpected weight change.  Respiratory: Negative for cough and shortness of breath.   Cardiovascular: Negative for chest pain.  Neurological: Positive for weakness.  Psychiatric/Behavioral: Positive for decreased concentration.    Vital Signs: BP (!) 175/97 (BP Location: Right Arm)   Pulse (!) 118   Temp 98 F (36.7 C)  Resp 18   Wt 103 lb (46.7 kg)   SpO2 93%   BMI 18.25 kg/m   Physical Exam  Cardiovascular: Normal rate and regular rhythm.  Pulmonary/Chest: Effort normal.  Abdominal: Soft. Bowel sounds are normal.  Musculoskeletal:  does not follow commands  Skin: Skin is warm and dry.  Psychiatric:  Consented with Son Dr Erick Alley via phone  Vitals reviewed.   Imaging: Dg Abd 1 View  Result Date: 11/08/2017 CLINICAL DATA:  Check for retained barium EXAM: ABDOMEN - 1 VIEW COMPARISON:  11/07/2017 FINDINGS: Contrast material is again identified throughout the colon. No obstructive changes are seen. Degenerative changes of lumbar spine are again noted. No new focal abnormality is seen. IMPRESSION: Considerable retained contrast within the colon likely precluding adequate upper GI examination. Electronically Signed   By: Inez Catalina M.D.   On: 11/08/2017 17:10   Dg Abd 1 View  Result Date: 11/07/2017 CLINICAL DATA:  84 year old female. Post esophagram. Possible upper GI series. Subsequent encounter. EXAM: ABDOMEN - 1 VIEW COMPARISON:  11/06/2017 esophagram.  11/04/2017 CT. FINDINGS: Residual contrast throughout the colon. Scoliosis lumbar spine convex right with superimposed degenerative changes. Post  CABG. IMPRESSION: Residual contrast throughout the colon. Electronically Signed   By: Genia Del M.D.   On: 11/07/2017 12:16   Ct Head Wo Contrast  Result Date: 12/05/2017 CLINICAL DATA:  Altered level of consciousness. EXAM: CT HEAD WITHOUT CONTRAST TECHNIQUE: Contiguous axial images were obtained from the base of the skull through the vertex without intravenous contrast. COMPARISON:  CT scan of September 20, 2016. FINDINGS: Brain: Mild chronic ischemic white matter disease is noted. No mass effect or midline shift is noted. Ventricular size is within normal limits. There is no evidence of mass lesion, hemorrhage or acute infarction. Vascular: No hyperdense vessel or unexpected calcification. Skull: Normal. Negative for fracture or focal lesion. Sinuses/Orbits: No acute finding. Other: None. IMPRESSION: Mild chronic ischemic white matter disease. No acute intracranial abnormality seen. Electronically Signed   By: Marijo Conception, M.D.   On: 12/05/2017 12:03   Mr Brain Wo Contrast (neuro Protocol)  Result Date: 12/05/2017 CLINICAL DATA:  84 year old female with altered mental status. EXAM: MRI HEAD WITHOUT CONTRAST TECHNIQUE: Multiplanar, multiecho pulse sequences of the brain and surrounding structures were obtained without intravenous contrast. COMPARISON:  Head CT without contrast 1140 hours today. Brain MRI 09/20/2016. FINDINGS: Brain: No restricted diffusion to suggest acute infarction. No midline shift, mass effect, evidence of mass lesion, ventriculomegaly, extra-axial collection or acute intracranial hemorrhage. Cervicomedullary junction and pituitary are within normal limits. Chronic lacunar infarcts in the brainstem and bilateral deep gray matter nuclei. Chronic confluent bilateral cerebral white matter T2 and FLAIR hyperintensity. Chronic small area of cortical encephalomalacia at the right motor strip. Chronic microhemorrhage in the lateral right thalamus. No new signal abnormality.  Stable  cerebral volume since 2018. Vascular: Major intracranial vascular flow voids are stable since 2018. Skull and upper cervical spine: Negative visible cervical spine. Visualized bone marrow signal is within normal limits. Sinuses/Orbits: Stable and negative. Other: Mastoids remain clear. Visible internal auditory structures appear normal. Scalp and face soft tissues appear negative. IMPRESSION: 1.  No acute intracranial abnormality. 2. Advanced chronic ischemic disease appears stable since 2018. Electronically Signed   By: Genevie Ann M.D.   On: 12/05/2017 17:00   Dg Chest Port 1 View  Result Date: 12/05/2017 CLINICAL DATA:  Altered mental status, difficulty with speech EXAM: PORTABLE CHEST 1 VIEW COMPARISON:  Portable chest x-ray of  11/09/2016 FINDINGS: No active infiltrate or effusion is seen. Mediastinal and hilar contours are unremarkable. Mild cardiomegaly is stable as is the ectatic descending thoracic aorta. Posttraumatic degenerative change of the right humeral neck is again noted. IMPRESSION: 1. No active lung disease. 2. Stable mild cardiomegaly. Electronically Signed   By: Ivar Drape M.D.   On: 12/05/2017 10:00   Dg Chest Port 1 View  Result Date: 11/09/2017 CLINICAL DATA:  Shortness of breath. History of hypertension and diabetes. Nonsmoker. EXAM: PORTABLE CHEST 1 VIEW COMPARISON:  11/04/2017 FINDINGS: Postoperative changes in the mediastinum. Cardiac enlargement. Perihilar interstitial infiltrates suggesting edema. No focal consolidation. No blunting of costophrenic angles. No pneumothorax. Calcified and tortuous aorta. IMPRESSION: Cardiac enlargement with perihilar edema. Electronically Signed   By: Lucienne Capers M.D.   On: 11/09/2017 01:13   Dg Ugi W/small Bowel High Density  Result Date: 11/10/2017 CLINICAL DATA:  Dysphagia.  Patient reports food coming back up. EXAM: UPPER GI SERIES WITH KUB TECHNIQUE: After obtaining a scout radiograph a routine upper GI series was performed using thin  barium FLUOROSCOPY TIME:  Fluoroscopy Time:  2.1 minutes Radiation Exposure Index (if provided by the fluoroscopic device): 60 mGy Number of Acquired Spot Images: 15 COMPARISON:  CT 10 19, gastric emptying study 11/08/2017, esophagram 11/06/2017 FINDINGS: Limited study due to patient immobility. Patient could not stand and has limited mobility. Single contrast exam performed through this region. Patient drank oral contrast with straw. Contrast filled a distended esophagus. Contrast was obstructed at the distal esophagus for long period time and was retained in the esophagus. Fluid esophagus retained to the level of the cervical esophagus. There is abrupt narrowing of the distal esophagus at the GE junction. Ultimately this stricturing at the [place] relaxes and contrast passed into the stomach. There is a smoothly marginated indentation at the GE junction which persists on all images. Limited view of the stomach demonstrates no evidence of obstruction or mass or torsion. Limited examination the stomach as above. At the end of the study with still significant retention of oral contrast within the esophagus. IMPRESSION: 1. 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 Differential includes benign and malignant stricturing and spasm at the GE junction. Recommend upper endoscopy for further evaluation. 2. Significant retention oral contrast within the esophagus during the entirety of exam 3. No gross evidence of mass within the stomach on limited exam. Findings conveyed toJESSICA ZEHR on 11/10/2017  at13:00. Electronically Signed   By: Suzy Bouchard M.D.   On: 11/10/2017 13:03   Nm Gastric Emptying Solid Liquid Both W/sb Transit  Result Date: 11/08/2017 CLINICAL DATA:  Nausea and vomiting, history stroke, type II diabetes mellitus, coronary disease post MI, hypertension EXAM: NUCLEAR MEDICINE GASTRIC EMPTYING SCAN TECHNIQUE: After oral ingestion of radiolabeled  meal, sequential abdominal images were obtained for 4 hours. Percentage of activity emptying the stomach was calculated at 1 hour, 2 hour, 3 hour, and 4 hours. RADIOPHARMACEUTICALS:  2.1 mCi Tc-10m sulfur colloid in standardized meal COMPARISON:  None FINDINGS: Expected location of the stomach in the left upper quadrant. Tracer empties the stomach gradually over the course of the study. 34% emptied at 1 hr ( normal >= 10%) 62% emptied at 2 hr ( normal >= 40%) 75% emptied at 3 hr ( normal >= 70%) 81% emptied at 4 hr ( normal >= 90%) IMPRESSION: Minimally decreased  gastric emptying. Electronically Signed   By: Lavonia Dana M.D.   On: 11/08/2017 13:14  Labs:  CBC: Recent Labs    11/22/17 0626 11/23/17 0558 12/05/17 0940 12/05/17 1100 12/05/17 1201 12/06/17 0628  WBC 7.8 7.8 9.4  --   --  8.3  HGB 7.3* 8.2* 9.6* 10.5* 8.2* 8.5*  HCT 24.2* 26.8* 33.3* 31.0* 24.0* 28.3*  PLT 320 359 449*  --   --  470*    COAGS: Recent Labs    02/09/17 2011 11/08/17 1643  INR 0.92 1.02    BMP: Recent Labs    11/22/17 0626 11/23/17 0558 11/23/17 0910 12/05/17 0940 12/05/17 1100 12/05/17 1201 12/06/17 0628  NA 135 134*  --  138 134* 137 137  K 3.5 3.5  --  4.5 4.4 4.1 4.4  CL 99 99  --  105 111 108 109  CO2 24 23  --  20*  --   --  17*  GLUCOSE 121* 141*  --  100* 100* 110* 254*  BUN 12 11  --  17 21 19 16   CALCIUM 9.1 9.4 10.2 11.1*  --   --  10.3  CREATININE 1.16* 1.24*  --  1.53* 1.50* 1.30* 1.41*  GFRNONAA 41* 38*  --  29*  --   --  32*  GFRAA 47* 44*  --  34*  --   --  37*    LIVER FUNCTION TESTS: Recent Labs    11/04/17 1110 11/05/17 0514 11/16/17 0606 12/05/17 0940  BILITOT 0.9 1.1  --  0.9  AST 17 14*  --  16  ALT 10 9  --  9  ALKPHOS 70 59  --  58  PROT 8.0 6.8  --  8.0  ALBUMIN 2.9* 2.6* 2.1* 2.7*    TUMOR MARKERS: No results for input(s): AFPTM, CEA, CA199, CHROMGRNA in the last 8760 hours.  Assessment and Plan:  Achalasia Continued wt loss Malnutrition;  debilitation FTT Scheduled for percutaneous gastric tube placement Risks and benefits discussed with the patient's son Dr Erick Alley via phone including, but not limited to the need for a barium enema during the procedure, bleeding, infection, peritonitis, or damage to adjacent structures.  All of his questions were answered, he is agreeable to proceed. Consent signed and in chart.   Thank you for this interesting consult.  I greatly enjoyed meeting Dianca Owensby and look forward to participating in their care.  A copy of this report was sent to the requesting provider on this date.  Electronically Signed: Lavonia Drafts, PA-C 12/06/2017, 12:12 PM   I spent a total of 40 Minutes    in face to face in clinical consultation, greater than 50% of which was counseling/coordinating care for percutaneous gastric tube placement

## 2017-12-06 NOTE — Evaluation (Signed)
Occupational Therapy Evaluation Patient Details Name: [name] MRN: [id number] DOB: [birth year] Today's Date: 12/06/2017    History of Present Illness 84 yo female with onset of UTI and AMS was admitted with ongoing loss of wgt and dehydration resulting from achalasia with poor result from botox in visit from 11/20/17.  Pt has FTT, has lost 50 pounds in last year.  Intake of mainly milk and is expecting to have g tube per son to restore recent I gait on RW and  I self care.  PMHx:  CAD, AKI, angina, cardiomegaly, CABG, CVA with L hemi, CKD 3, transfusion, pleural effusion, DM, achalasia with botox procedure   Clinical Impression   PTA, pt was living alone and was performing ADLs. Pt currently requiring Mod A for UB ADLs, Total A for LB ADLs, and Total A for sit<>Stand transfer. Pt presenting with significant weakness and fatigue. Pt performing grooming at EOB with Mod A for preparation of task and Min A for sitting balance; maintaining sitting for ~7 minutes. Pt would benefit from further acute OT to facilitate safe dc. Recommend dc to SNF for further OT to optimize safety, independence with ADLs, and return to PLOF.       Follow Up Recommendations  SNF    Equipment Recommendations  Other (comment)(Defer to next venue)    Recommendations for Other Services PT consult     Precautions / Restrictions Precautions Precautions: Fall Precaution Comments: coccygeal wound Restrictions Weight Bearing Restrictions: No      Mobility Bed Mobility Overal bed mobility: Needs Assistance Bed Mobility: Supine to Sit;Sit to Supine     Supine to sit: Max assist Sit to supine: Max assist   General bed mobility comments: Max A to bring BLEs to EOB and then elevate trunk  Transfers Overall transfer level: Needs assistance   Transfers: Sit to/from Stand Sit to Stand: Total assist         General transfer comment: Total A to power up into standing. Unable to achieve upright posture     Balance Overall balance assessment: Needs assistance Sitting-balance support: No upper extremity supported;Feet supported Sitting balance-Leahy Scale: Poor     Standing balance support: No upper extremity supported;During functional activity Standing balance-Leahy Scale: Zero                             ADL either performed or assessed with clinical judgement   ADL Overall ADL's : Needs assistance/impaired Eating/Feeding: Moderate assistance;Bed level   Grooming: Sitting;Moderate assistance;Oral care;Wash/dry face Grooming Details (indicate cue type and reason): Pt sitting at EOB with Mod A for managing toothbrush and mouth wash. Min A for occasional support and to correct sitting posture. Pt perseverating on parts of tasks and requiring cues for termination. Sitting uproght for ~7 minutes. Poor RUE control and fatigues quickly Upper Body Bathing: Moderate assistance   Lower Body Bathing: Total assistance;Bed level;Sitting/lateral leans;Sit to/from stand   Upper Body Dressing : Moderate assistance;Sitting   Lower Body Dressing: Total assistance;Sit to/from stand;Sitting/lateral leans;Bed level               Functional mobility during ADLs: Total assistance(sit<>stand only) General ADL Comments: Pt presenting with significant fatigue and poor activity tolerance.     Vision         Perception     Praxis      Pertinent Vitals/Pain Pain Assessment: Faces Faces Pain Scale: Hurts little more Pain Location: generally and sacral area Pain  Descriptors / Indicators: Sore Pain Intervention(s): Limited activity within patient's tolerance;Monitored during session;Repositioned     Hand Dominance Right   Extremity/Trunk Assessment Upper Extremity Assessment Upper Extremity Assessment: Generalized weakness   Lower Extremity Assessment Lower Extremity Assessment: Generalized weakness   Cervical / Trunk Assessment Cervical / Trunk Assessment: Kyphotic    Communication Communication Communication: Other (comment)(Only noding for yes/no questions. becoming more talkative at end of session stating "I want some milk")   Cognition Arousal/Alertness: Lethargic Behavior During Therapy: Flat affect Overall Cognitive Status: Difficult to assess Area of Impairment: Following commands;Attention;Problem solving                   Current Attention Level: Sustained   Following Commands: Follows one step commands inconsistently     Problem Solving: Slow processing;Decreased initiation;Requires verbal cues;Requires tactile cues General Comments: Pt performing ADLs with cues and increased time. Pt presenting with perseveration of tasks as seen by brushing her front bottom teeth, washing her lips, or swishing/spitting wateri for significant amount of time until cued for termination. At end of session, pt perseverating on walking milk   General Comments  Son present throughout session    Exercises     Shoulder Instructions      Home Living Family/patient expects to be discharged to:: Skilled nursing facility Living Arrangements: Children Available Help at Discharge: Available PRN/intermittently Type of Home: House Home Access: Stairs to enter [employer] of Steps: 2 Entrance Stairs-Rails: None Home Layout: Multi-level Alternate Level Stairs-Number of Steps: 5 and 5        Bathroom Toilet: Standard     Home Equipment: Walker - 2 wheels;Shower seat;Wheelchair - manual   Additional Comments: son from out of state is present       Prior Functioning/Environment Level of Independence: Needs assistance  Gait / Transfers Assistance Needed: RW with no assist in recent history per chart ADL's / Homemaking Assistance Needed: living in her own home recently and performing ADLs. Family hired someone for IADLs            OT Problem List: Decreased strength;Decreased range of motion;Decreased activity tolerance;Impaired  balance (sitting and/or standing);Decreased safety awareness;Decreased knowledge of use of DME or AE;Decreased knowledge of precautions      OT Treatment/Interventions: Self-care/ADL training;Therapeutic exercise;Energy conservation;DME and/or AE instruction;Therapeutic activities;Patient/family education    OT Goals(Current goals can be found in the care plan section) Acute Rehab OT Goals Patient Stated Goal: "for her to get stronger" per son OT Goal Formulation: With patient/family Time For Goal Achievement: 12/20/17 Potential to Achieve Goals: Good  OT Frequency: Min 2X/week   Barriers to D/C:            Co-evaluation              AM-PAC PT "6 Clicks" Daily Activity     Outcome Measure Help from another person eating meals?: A Little Help from another person taking care of personal grooming?: A Lot Help from another person toileting, which includes using toliet, bedpan, or urinal?: A Lot Help from another person bathing (including washing, rinsing, drying)?: A Lot Help from another person to put on and taking off regular upper body clothing?: A Lot Help from another person to put on and taking off regular lower body clothing?: Total 6 Click Score: 12   End of Session Equipment Utilized During Treatment: Gait belt Nurse Communication: Mobility status  Activity Tolerance: Patient limited by fatigue Patient left: in bed;with call bell/phone within reach;with bed alarm set;with  family/visitor present  OT Visit Diagnosis: Unsteadiness on feet (R26.81);Other abnormalities of gait and mobility (R26.89);Muscle weakness (generalized) (M62.81)                Time: 6754-4920 OT Time Calculation (min): 32 min Charges:  OT General Charges $OT Visit: 1 Visit OT Evaluation $OT Eval Moderate Complexity: 1 Mod OT Treatments $Self Care/Home Management : 8-22 mins  Tajai Ihde MSOT, OTR/L Acute Rehab Pager: [phone] Office: [place] 12/06/2017,  10:58 AM

## 2017-12-06 NOTE — Progress Notes (Signed)
PROGRESS NOTE    [name]  YTK:[id number] DOB: [birth year] DOA: 12/05/2017 PCP: Deland Pretty, MD    Brief Narrative:  [name] is a 84 y.o. female with medical history significant for DM2, HTN, HLD, CAD s/p CABG, h/o CVA, CKD3, who was recently diagnosed with achalasia after a 50 lb weight loss over the past year. She was treated with botox earlier in October and sent to SNF with the hopes she would be able to swallow her food. She has not done well since that time, has only been able to drink small amounts of milk. She was noticed to be stuttering, having difficulty with articulation. And was sent to [facility] for evaluation of stroke. MRi brain was negative for CVA.  Meanwhile her oral intake has been very low and she is losing weight, and her son at bedside requesting PEG Placement. IR consulted.   Assessment & Plan:   Principal Problem:   Dysarthria Active Problems:   Diabetes mellitus ([place])   Hyperlipidemia   Essential hypertension   Coronary atherosclerosis   Stroke (cerebrum) (HCC)   Stage 3 chronic kidney disease (HCC)   Acute lower UTI   Failure to thrive in adult   UTI (urinary tract infection)   Dysarthria:  Probably from UTI, MRI is negative for CVA.  Non focal neuro exam.  Urine cultures are pending. Currently on IV rocephin.her dysarthria improved.    H/o Achalasia with 50 lb weight loss.  Failed treatment with botox.  IR consulted for PEG placement.    Mild dehydration:  - gentle hydration.    Diabetes mellitus;  CBG (last 3)  Recent Labs    12/05/17 0942 12/06/17 0804  GLUCAP 89 231*   Resume SSI.    Hyperlipidemia: Resume statin.    Hypertension Not well controlled, restarted home meds and added hydralazine prn.    CAD:  No chest pain or sob at this time.     STAGE 3 CKD  Creatinine at baseline.    Anemia of chronic disease Hemoglobin of 10 on admission, is probably a hemo concentrated sample from dehydration.    Failure to  thrive:  Nutrition consulted for recommendations.     DVT prophylaxis: HEPARIN SQ Code Status: full code.  Family Communication:son at bedside.  Disposition Plan: pending clinical improvement.    Consultants:   IR    Procedures: IR consulted for PEG   Antimicrobials: ROCEPHIN SINCE ADMISSION FOR UTI.     Subjective: Reports feeling cold.   Objective: Vitals:   12/05/17 1930 12/05/17 2013 12/06/17 0608 12/06/17 0759  BP: (!) 150/86 (!) 167/83 (!) 173/87 (!) 175/97  Pulse:  (!) 104 (!) 117 (!) 118  Resp: 19 18 18 18   Temp:  97.6 F (36.4 C) 98 F (36.7 C)   TempSrc:  Oral    SpO2:  99% 100% 93%  Weight:  46.7 kg      Intake/Output Summary (Last 24 hours) at 12/06/2017 1038 Last data filed at 12/06/2017 0949 Gross per 24 hour  Intake 80 ml  Output 0 ml  Net 80 ml   Filed Weights   12/05/17 2013  Weight: 46.7 kg    Examination:  General exam: Appears calm and comfortable  Respiratory system: Clear to auscultation. Respiratory effort normal. Cardiovascular system: S1 & S2 heard, RRR. No JVD,. No pedal edema. Gastrointestinal system: Abdomen is nondistended, soft and nontender. No organomegaly or masses felt. Normal bowel sounds heard. Central nervous system: Alert and oriented. Grossly non focal.  Extremities: diminished strength in all extremities.  Skin: No rashes, lesions or ulcers Psychiatry:  Mood & affect appropriate.     Data Reviewed: I have personally reviewed following labs and imaging studies  CBC: Recent Labs  Lab 12/05/17 0940 12/05/17 1100 12/05/17 1201 12/06/17 0628  WBC 9.4  --   --  8.3  NEUTROABS 7.5  --   --   --   HGB 9.6* 10.5* 8.2* 8.5*  HCT 33.3* 31.0* 24.0* 28.3*  MCV 91.5  --   --  88.7  PLT 449*  --   --  992*   Basic Metabolic Panel: Recent Labs  Lab 12/05/17 0940 12/05/17 1100 12/05/17 1201 12/06/17 0628  NA 138 134* 137 137  K 4.5 4.4 4.1 4.4  CL 105 111 108 109  CO2 20*  --   --  17*  GLUCOSE 100* 100*  110* 254*  BUN 17 21 19 16   CREATININE 1.53* 1.50* 1.30* 1.41*  CALCIUM 11.1*  --   --  10.3   GFR: Estimated Creatinine Clearance: 20.3 mL/min (A) (by C-G formula based on SCr of 1.41 mg/dL (H)). Liver Function Tests: Recent Labs  Lab 12/05/17 0940  AST 16  ALT 9  ALKPHOS 58  BILITOT 0.9  PROT 8.0  ALBUMIN 2.7*   No results for input(s): LIPASE, AMYLASE in the last 168 hours. No results for input(s): AMMONIA in the last 168 hours. Coagulation Profile: No results for input(s): INR, PROTIME in the last 168 hours. Cardiac Enzymes: Recent Labs  Lab 12/05/17 1155  TROPONINI <0.03   BNP (last 3 results) No results for input(s): PROBNP in the last 8760 hours. HbA1C: No results for input(s): HGBA1C in the last 72 hours. CBG: Recent Labs  Lab 12/05/17 0942 12/06/17 0804  GLUCAP 89 231*   Lipid Profile: No results for input(s): CHOL, HDL, LDLCALC, TRIG, CHOLHDL, LDLDIRECT in the last 72 hours. Thyroid Function Tests: No results for input(s): TSH, T4TOTAL, FREET4, T3FREE, THYROIDAB in the last 72 hours. Anemia Panel: No results for input(s): VITAMINB12, FOLATE, FERRITIN, TIBC, IRON, RETICCTPCT in the last 72 hours. Sepsis Labs: Recent Labs  Lab 12/05/17 1100 12/05/17 1202  LATICACIDVEN 1.49 0.63    Recent Results (from the past 240 hour(s))  MRSA PCR Screening     Status: Abnormal   Collection Time: 12/06/17  6:31 AM  Result Value Ref Range Status   MRSA by PCR POSITIVE (A) NEGATIVE Final    Comment:        The GeneXpert MRSA Assay (FDA approved for NASAL specimens only), is one component of a comprehensive MRSA colonization surveillance program. It is not intended to diagnose MRSA infection nor to guide or monitor treatment for MRSA infections. RESULT CALLED TO, READ BACK BY AND VERIFIED WITH: Marta Antu RN 10:25 12/06/17 ([email or web address]) Performed at [facility], [place] [address] [place], [place] [address]          Radiology Studies: Ct Head Wo  Contrast  Result Date: 12/05/2017 CLINICAL DATA:  Altered level of consciousness. EXAM: CT HEAD WITHOUT CONTRAST TECHNIQUE: Contiguous axial images were obtained from the base of the skull through the vertex without intravenous contrast. COMPARISON:  CT scan of September 20, 2016. FINDINGS: Brain: Mild chronic ischemic white matter disease is noted. No mass effect or midline shift is noted. Ventricular size is within normal limits. There is no evidence of mass lesion, hemorrhage or acute infarction. Vascular: No hyperdense vessel or unexpected calcification. Skull: Normal. Negative for fracture  or focal lesion. Sinuses/Orbits: No acute finding. Other: None. IMPRESSION: Mild chronic ischemic white matter disease. No acute intracranial abnormality seen. Electronically Signed   By: Marijo Conception, M.D.   On: 12/05/2017 12:03   Mr Brain Wo Contrast (neuro Protocol)  Result Date: 12/05/2017 CLINICAL DATA:  84 year old female with altered mental status. EXAM: MRI HEAD WITHOUT CONTRAST TECHNIQUE: Multiplanar, multiecho pulse sequences of the brain and surrounding structures were obtained without intravenous contrast. COMPARISON:  Head CT without contrast 1140 hours today. Brain MRI 09/20/2016. FINDINGS: Brain: No restricted diffusion to suggest acute infarction. No midline shift, mass effect, evidence of mass lesion, ventriculomegaly, extra-axial collection or acute intracranial hemorrhage. Cervicomedullary junction and pituitary are within normal limits. Chronic lacunar infarcts in the brainstem and bilateral deep gray matter nuclei. Chronic confluent bilateral cerebral white matter T2 and FLAIR hyperintensity. Chronic small area of cortical encephalomalacia at the right motor strip. Chronic microhemorrhage in the lateral right thalamus. No new signal abnormality.  Stable cerebral volume since 2018. Vascular: Major intracranial vascular flow voids are stable since 2018. Skull and upper cervical spine: Negative visible  cervical spine. Visualized bone marrow signal is within normal limits. Sinuses/Orbits: Stable and negative. Other: Mastoids remain clear. Visible internal auditory structures appear normal. Scalp and face soft tissues appear negative. IMPRESSION: 1.  No acute intracranial abnormality. 2. Advanced chronic ischemic disease appears stable since 2018. Electronically Signed   By: Genevie Ann M.D.   On: 12/05/2017 17:00   Dg Chest Port 1 View  Result Date: 12/05/2017 CLINICAL DATA:  Altered mental status, difficulty with speech EXAM: PORTABLE CHEST 1 VIEW COMPARISON:  Portable chest x-ray of 11/09/2016 FINDINGS: No active infiltrate or effusion is seen. Mediastinal and hilar contours are unremarkable. Mild cardiomegaly is stable as is the ectatic descending thoracic aorta. Posttraumatic degenerative change of the right humeral neck is again noted. IMPRESSION: 1. No active lung disease. 2. Stable mild cardiomegaly. Electronically Signed   By: Ivar Drape M.D.   On: 12/05/2017 10:00        Scheduled Meds: . amLODipine  10 mg Oral Daily  . atorvastatin  40 mg Oral q1800  . cholecalciferol  400 Units Oral Daily  . ferrous sulfate  300 mg Oral Daily  . heparin  5,000 Units Subcutaneous Q8H  . insulin aspart  0-5 Units Subcutaneous QHS  . insulin aspart  0-9 Units Subcutaneous TID WC  . isosorbide mononitrate  30 mg Oral Daily  . metoprolol tartrate  75 mg Oral BID  . mirtazapine  7.5 mg Oral QHS  . pantoprazole  40 mg Oral BID  . polyethylene glycol  17 g Oral Daily  . senna  1 tablet Oral QHS   Continuous Infusions: . cefTRIAXone (ROCEPHIN)  IV    . dextrose 5 % and 0.9% NaCl 40 mL/hr at 12/06/17 0834     LOS: 0 days    Time spent: 35 minutes.     Hosie Poisson, MD [facility] Pager [phone]  If 7PM-7AM, please contact night-coverage [email or web address] Password [email or web address] 12/06/2017, 10:38 AM

## 2017-12-06 NOTE — Progress Notes (Signed)
New Admission Note:  Arrival Method: Via stretcher from ED Mental Orientation: Alert & Oriented to self.  Telemetry: N/A Assessment: Completed Skin: Refer to flowsheet IV: Right Wrist Pain: Safety Measures: Safety Fall Prevention Plan discussed with patient. Admission: Completed 5 [place] Orientation: Patient has been orientated to the room, unit and the staff.  Orders have been reviewed and are being implemented. Will continue to monitor the patient. Call light has been placed within reach and bed alarm has been activated.   Vassie Moselle, RN  Phone Number: [phone]

## 2017-12-06 NOTE — Progress Notes (Signed)
Pt was seen for evaluation of mobility and was quite weak, unable to stand.  Her son arrived after PT had repositioned her and talked about his plans for her care.  Hope is that PEG tube will increase her energy and ability, then will recommend her to SNF to restore her mobility as she can tolerate.  Follow acutely for strengthening and balance skills to increase standing control toward more independence with gait.   12/06/17 0800  PT Visit Information  Last PT Received On 12/06/17  Assistance Needed +2 (for transfers to chair)  History of Present Illness 84 yo female with onset of UTI and AMS was admitted with ongoing loss of wgt and dehydration resulting from achalasia with poor result from botox in visit from 11/20/17.  Pt has FTT, has lost 50 pounds in last year.  Intake of mainly milk and is expecting to have g tube per son to restore recent I gait on RW and  I self care.  PMHx:  CAD, AKI, angina, cardiomegaly, CABG, CVA with L hemi, CKD 3, transfusion, pleural effusion, DM, achalasia with botox procedure  Precautions  Precautions Fall  Precaution Comments coccygeal wound  Restrictions  Weight Bearing Restrictions No  Home Living  Family/patient expects to be discharged to: [place]  Available Help at Discharge Available PRN/intermittently  Type of Gray to enter  Entrance Stairs-Number of Steps 2  Entrance Stairs-Rails None  Home Layout Multi-level  Alternate Level Stairs-Number of Steps 5 and [place] - 2 wheels;Shower seat;Wheelchair - manual  Additional Comments son from out of state is present   Prior Function  Level of [place] Needs assistance  Gait / Transfers Assistance Needed RW with no assist in recent history per chart  ADL's / [employer] living in her own home recently  Communication  Communication Expressive difficulties  Pain  Assessment  Pain Assessment Faces  Faces Pain Scale 4  Pain Location generally and sacral area  Pain Descriptors / Indicators Sore  Pain Intervention(s) Limited activity within patient's tolerance;Monitored during session;Repositioned  Cognition  Arousal/Alertness Lethargic  Behavior During Therapy Flat affect  Overall Cognitive Status Difficult to assess  Following Commands Follows one step commands inconsistently  Problem Solving Slow processing;Decreased initiation;Requires verbal cues;Requires tactile cues  General Comments pt is tired and weak, very low tolerance for mobility  Upper Extremity Assessment  Upper Extremity Assessment Generalized weakness  Lower Extremity Assessment  Lower Extremity Assessment Generalized weakness  Cervical / Trunk Assessment  Cervical / Trunk Assessment Kyphotic  Bed Mobility  Overal bed mobility Needs Assistance  Bed Mobility Supine to Sit;Sit to Supine  Supine to sit Max assist  Sit to supine Max assist  General bed mobility comments PT verbalized and initiated all steps with pt expressing discomfort to move joints and likely due to skin change even with that area NWB  Transfers  General transfer comment could not attempt due to weakness  Ambulation/Gait  General Gait Details unable to stand   Balance  Overall balance assessment Needs assistance  Sitting-balance support Feet supported;Bilateral upper extremity supported  Sitting balance-Leahy Scale Poor  Sitting balance - Comments sitting EOB  Postural control Left lateral lean  General Comments  General comments (skin integrity, edema, etc.) talked about pt history with achalasia with son who hopes to have a PEG tube for pt to increase nutrition and get her back to PLOF  Exercises  Exercises General Lower Extremity  General Exercises - Lower Extremity  Ankle Circles/Pumps AAROM;Both;5 reps  Long Arc Gila;Both;10 reps  Heel Slides AAROM;Both;10 reps  PT - End of Session  Activity  Tolerance Patient limited by fatigue;Treatment limited secondary to medical complications (Comment) (poor intake and dehydration )  Patient left in bed;with call bell/phone within reach;with bed alarm set;with family/visitor present  Nurse Communication Mobility status  PT Assessment  PT Recommendation/Assessment Patient needs continued PT services  PT Visit Diagnosis Muscle weakness (generalized) (M62.81);Other abnormalities of gait and mobility (R26.89);Adult, failure to thrive (R62.7);Hemiplegia and hemiparesis  Hemiplegia - Right/Left Left  Hemiplegia - dominant/non-dominant Non-dominant  Hemiplegia - caused by Unspecified  PT Problem List Decreased strength;Decreased range of motion;Decreased activity tolerance;Decreased balance;Decreased mobility;Decreased coordination;Decreased cognition;Decreased knowledge of use of DME;Decreased safety awareness;Cardiopulmonary status limiting activity;Decreased skin integrity;Pain  Barriers to Discharge Decreased caregiver support  Barriers to Discharge Comments Pt previously lived alone with family support  PT Plan  PT Frequency (ACUTE ONLY) Min 2X/week  PT Treatment/Interventions (ACUTE ONLY) DME instruction;Functional mobility training;Therapeutic activities;Therapeutic exercise;Balance training;Neuromuscular re-education;Patient/family education  AM-PAC PT "6 Clicks" Daily Activity Outcome Measure  Difficulty turning over in bed (including adjusting bedclothes, sheets and blankets)? 1  Difficulty moving from lying on back to sitting on the side of the bed?  1  Difficulty sitting down on and standing up from a chair with arms (e.g., wheelchair, bedside commode, etc,.)? 1  Help needed moving to and from a bed to chair (including a wheelchair)? 2  Help needed walking in hospital room? 1  Help needed climbing 3-5 steps with a railing?  1  6 Click Score 7  Mobility G Code  CM  PT Recommendation  Follow Up Recommendations SNF  PT equipment None  recommended by PT  Individuals Consulted  Consulted and Agree with Results and Recommendations Family member/caregiver  Family Member Consulted son  Acute Rehab PT Goals  Patient Stated Goal none stated  PT Goal Formulation With family  Time For Goal Achievement 12/20/17  Potential to Achieve Goals Fair  PT Time Calculation  PT Start Time (ACUTE ONLY) 0801  PT Stop Time (ACUTE ONLY) 0830  PT Time Calculation (min) (ACUTE ONLY) 29 min  PT General Charges  $$ ACUTE PT VISIT 1 Visit  PT Evaluation  $PT Eval Moderate Complexity 1 Mod  PT Treatments  $Therapeutic Activity 8-22 mins  Written Expression  Dominant Hand Right    Mee Hives, PT MS Acute Rehab Dept. Number: [place]

## 2017-12-06 NOTE — Progress Notes (Signed)
Initial Nutrition Assessment  DOCUMENTATION CODES:   Non-severe (moderate) malnutrition in context of chronic illness, Underweight  INTERVENTION:   -RD adjusted meal orders based upon food preferences to maximize PO intake -Magic Cup TID with meals, each supplement provides 290 kcals and 9 grams protein -2% milk with all meals -MVI with minerals daily -Per son, potential plan for PEG placement. Recommend:  Initiate Jevity 1.2 @ 25 ml/hr and increase by 10 ml every 12 hours to goal rate of 55 ml/hr.   Tube feeding regimen provides 1584 kcal (100% of needs), 73 grams of protein, and 1065 ml of H2O.   -If TF initiated, pt is at high risk for refeeding syndrome. Monitor K, Mg, and Phos daily x 3 days and replete as needed if feedings are started  NUTRITION DIAGNOSIS:   Moderate Malnutrition related to chronic illness(dysphagia 2/2 to achalasia) as evidenced by energy intake < or equal to 75% for > or equal to 1 month, mild fat depletion, moderate fat depletion, mild muscle depletion, moderate muscle depletion, energy intake < or equal to 50% for > or equal to 1 month.  GOAL:   Patient will meet greater than or equal to 90% of their needs  MONITOR:   PO intake, Supplement acceptance, Labs, Weight trends, Skin, I & O's  REASON FOR ASSESSMENT:   Consult Assessment of nutrition requirement/status, Poor PO  ASSESSMENT:   [name] is a 84 y.o. female with medical history significant for DM2, HTN, HLD, CAD s/p CABG, h/o CVA, CKD3, who was recently diagnosed with achalasia after a 50 lb weight loss over the past year. She was treated with botox earlier in October and sent to SNF with the hopes she would be able to swallow her food. She has not done well since that time, has only been able to drink small amounts of milk. Son is a Management consultant in [place] and has been visiting. Yesterday he noticed she was stuttering, having difficulty getting words out, and progressed until she  seemed to have total aphasia last night. She does report having dysuria and some lower abd pain. No N/V. Has a good appetite but avoids food because of the obstruction. Megace and remeron have not helped. Process of being worked up for G tube has been initiated and pt reportedly has appt with [facility] tomorrow morning. Son feels like her weight loss and FTT could be reversible if she gets a G tube as she was reportedly very functional a month ago, walking with a walker and performing all of her ADLs.   Pt admitted with dysarthria and achalasia.   Per chart review, pt awaiting GI consult.   Spoke mainly with pt son at bedside. He reports pt with a general decline in health over the past month. He reveals that pt was diagnosed with achalasia approximately 1-2 months ago and failed botox treatments. Pt has experienced extreme difficulty with swallowing solid foods. Per pt son, diet has mainly consisted of milk, tomato soup, and oatmeal. She has been offered nutritional supplements in the past, however, pt does not tolerate these well due to sweet taste (pt son suspects some sweet taste aversion related to poor prolonged intake). He expressed interest in pursuing PEG placement for nutritional rehabilitation.   Per pt son, pt UBW is around 175#, which she last weighed about one year ago. Reviewed wt hx, which reveals a 36% wt loss over the past year, which is significant for time frame. Per son, weight loss and functional  status have significantly declined over the past month- pt was walking with a walker and climbing stairs at baseline, however, is now bedbound.   Pt son reports pt was recently upgraded to a pureed diet at SNF with close SLP observation, however, pt has refused to eat pureed foods due to thickness and not being appealing to her.   Reviewed menu choices with pt and son; RD personally obtained preferences and meal orders for pt. Pt declining Ensure and Boost Breeze supplements at this time.  Pt son hopeful for improvement with PEG placement.   PTA medications reviewed and include megace. Per pt son, pt was recently started on remeron in attempt to stimulate appetite.   Last Hgb A1c: 9.0 (11/04/17). PTA DM medications are 10 mg glipizide daily.   Labs reviewed: CBGS: 231 (inpatient orders for glycemic control are 0-5 units insulin aspart q HS and 0-9 units insulin aspart TID).   NUTRITION - FOCUSED PHYSICAL EXAM:    Most Recent Value  Orbital Region  Mild depletion  Upper Arm Region  Moderate depletion  Thoracic and Lumbar Region  Mild depletion  Buccal Region  Mild depletion  Temple Region  Moderate depletion  Clavicle Bone Region  Moderate depletion  Clavicle and Acromion Bone Region  Moderate depletion  Scapular Bone Region  Moderate depletion  Dorsal Hand  Moderate depletion  Patellar Region  Mild depletion  Anterior Thigh Region  Mild depletion  Posterior Calf Region  Mild depletion  Edema (RD Assessment)  Mild  Hair  Reviewed  Eyes  Reviewed  Mouth  Reviewed  Skin  Reviewed  Nails  Reviewed       Diet Order:   Diet Order            Diet Heart Room service appropriate? Yes; Fluid consistency: Thin  Diet effective now              EDUCATION NEEDS:   Education needs have been addressed  Skin:  Skin Integrity Issues:: Stage II, Other (Comment) Stage II: coccyx Other: partial thickness wounds to bilateral buttocks (likely from adhesive)  Last BM:  PTA  Height:   Ht Readings from Last 1 Encounters:  11/20/17 5\' 3"  (1.6 m)    Weight:   Wt Readings from Last 1 Encounters:  12/05/17 46.7 kg    Ideal Body Weight:  52.3 kg  BMI:  Body mass index is 18.25 kg/m.  Estimated Nutritional Needs:   Kcal:  1450-1650  Protein:  60-75 grams  Fluid:  > 1.4 L    Tonie Vizcarrondo A. Jimmye Norman, RD, LDN, CDE Pager: [phone] After hours Pager: [phone]

## 2017-12-06 NOTE — Consult Note (Signed)
[place] Nurse wound consult note Reason for Consult: one stage 2 on coccyx and two partial thickness, one on each buttock from removal of adhesives. Wound type:pressure, trauma Pressure Injury POA: Yes Measurement:coccygeal wound is 0.3cm x 0.2cm x 0.1cm. Has partial thickness on either side that looks like from previous dressing removals, they are in the shape of the corner of tape.  Wound DVV:OHYW Drainage (amount, consistency, odor) none Periwound: intact Dressing procedure/placement/frequency: I have provided nurses with orders for foam dressing for protection of sacral area. Pt is emaciated. Foam dressing will have less abrasive edges to peel back to assess wound. Protective foam covers both buttock wounds that were most likely caused by tape prior to admission. Purewick in place to assist with keeping wound clear of urine.  Nutritional consult has already been placed. This will help for optimum wound healing. Pt  Compliant with lying on side to relieve pressure from sacrum. We will not follow, but will remain available to this patient, to nursing, and the medical and/or surgical teams.  Please re-consult if we need to assist further.  Fara Olden, RN-C, WTA-C, [place] [facility]

## 2017-12-07 ENCOUNTER — Observation Stay (HOSPITAL_COMMUNITY): Payer: Medicare Other

## 2017-12-07 DIAGNOSIS — N390 Urinary tract infection, site not specified: Secondary | ICD-10-CM | POA: Diagnosis not present

## 2017-12-07 DIAGNOSIS — R1310 Dysphagia, unspecified: Secondary | ICD-10-CM | POA: Diagnosis not present

## 2017-12-07 DIAGNOSIS — R471 Dysarthria and anarthria: Secondary | ICD-10-CM | POA: Diagnosis not present

## 2017-12-07 DIAGNOSIS — N3000 Acute cystitis without hematuria: Secondary | ICD-10-CM | POA: Diagnosis not present

## 2017-12-07 LAB — PROTIME-INR
INR: 1.16
Prothrombin Time: 14.7 seconds (ref 11.4–15.2)

## 2017-12-07 LAB — GLUCOSE, CAPILLARY
Glucose-Capillary: 209 mg/dL — ABNORMAL HIGH (ref 70–99)
Glucose-Capillary: 182 mg/dL — ABNORMAL HIGH (ref 70–99)
Glucose-Capillary: 133 mg/dL — ABNORMAL HIGH (ref 70–99)
Glucose-Capillary: 166 mg/dL — ABNORMAL HIGH (ref 70–99)

## 2017-12-07 MED ORDER — MUPIROCIN 2 % EX OINT
1.0000 "application " | TOPICAL_OINTMENT | Freq: Two times a day (BID) | CUTANEOUS | Status: AC
  Administered 2017-12-07 – 2017-12-11 (×10): 1 via NASAL
  Filled 2017-12-07 (×5): qty 22

## 2017-12-07 MED ORDER — CHLORHEXIDINE GLUCONATE CLOTH 2 % EX PADS
6.0000 | MEDICATED_PAD | Freq: Every day | CUTANEOUS | Status: AC
  Administered 2017-12-07 – 2017-12-11 (×5): 6 via TOPICAL

## 2017-12-07 NOTE — Progress Notes (Signed)
[name], female   DOB: [birth year], 84 y.o.   MRN: [id number] I reviewed patient's imaging for percutaneous gastrostomy tube placement.  I have many concerns about the patient's anatomy.  Patient has a moderate sized hiatal hernia and colon is in the upper abdomen.  We can bring patient to IR and evaluate anatomy with fluoroscopy but patient may not be a candidate for percutaneous gastrostomy tube placement.

## 2017-12-07 NOTE — Progress Notes (Signed)
PROGRESS NOTE    [name]  WNU:[id number] DOB: 1927/11/28 DOA: 12/05/2017 PCP: Deland Pretty, MD    Brief Narrative:  [name] is a 84 y.o. female with medical history significant for DM2, HTN, HLD, CAD s/p CABG, h/o CVA, CKD3, who was recently diagnosed with achalasia after a 50 lb weight loss over the past year. She was treated with botox earlier in October and sent to SNF with the hopes she would be able to swallow her food. She has not done well since that time, has only been able to drink small amounts of milk. She was noticed to be stuttering, having difficulty with articulation. And was sent to [facility] for evaluation of stroke. MRi brain was negative for CVA.  Meanwhile her oral intake has been very low and she is losing weight, and her son at bedside requesting PEG Placement. IR consulted.   Assessment & Plan:   Principal Problem:   Dysarthria Active Problems:   Diabetes mellitus ([place])   Hyperlipidemia   Essential hypertension   Coronary atherosclerosis   Stroke (cerebrum) (HCC)   Stage 3 chronic kidney disease (HCC)   Acute lower UTI   Failure to thrive in adult   UTI (urinary tract infection)   Dysarthria:  Probably from UTI, MRI is negative for CVA.  Non focal neuro exam.  Urine cultures are pending. Currently on IV rocephin.her dysarthria improved.    H/o Achalasia with 50 lb weight loss.  Failed treatment with botox.  IR consulted for PEG placement. Plan to place today.    Mild dehydration:  - gentle hydrate with IV fluids    Diabetes mellitus;  CBG (last 3)  Recent Labs    12/06/17 1650 12/06/17 2145 12/07/17 0730  GLUCAP 204* 164* 209*   Resume SSI.    Hyperlipidemia: Resume statin.    Hypertension Not well controlled, restarted home meds and added hydralazine prn. Patient is also refusing meds.    CAD:  No chest pain or sob at this time.     STAGE 3 CKD  Creatinine at baseline.    Anemia of chronic disease Hemoglobin of 10 on  admission, is probably a hemo concentrated sample from dehydration.  Hemoglobin stable around 8.5.    Failure to thrive:  Nutrition consulted for recommendations.  Plan to start tube feeds from tomorrow.    Chest pain yesterday, resolved within minutes: Abnormal EKG with st t wave changes and troponin is 0.03.  Repeat EKG this am. And continue with NTG.    DVT prophylaxis: HEPARIN SQ Code Status: full code.  Family Communication:son at bedside.  Disposition Plan: pending clinical improvement.    Consultants:   IR    Procedures: IR consulted for PEG   Antimicrobials: ROCEPHIN SINCE ADMISSION FOR UTI.     Subjective: Pt sleeping comfortably. No chest pain overnight. No sob.   Objective: Vitals:   12/06/17 0759 12/06/17 1651 12/06/17 2145 12/07/17 0557  BP: (!) 175/97 (!) 154/81 (!) 163/94 (!) 179/81  Pulse: (!) 118 (!) 115 (!) 112 100  Resp: 18 18 18 18   Temp:  98.2 F (36.8 C) 98.6 F (37 C) (!) 97.3 F (36.3 C)  TempSrc:  Oral Oral Oral  SpO2: 93% 100% 100% 100%  Weight:        Intake/Output Summary (Last 24 hours) at 12/07/2017 0940 Last data filed at 12/07/2017 0923 Gross per 24 hour  Intake 1598.12 ml  Output 0 ml  Net 1598.12 ml   [employer]  12/05/17 2013  Weight: 46.7 kg    Examination:  General exam: Appears calm and comfortable not in any kind of distress.  Respiratory system: Clear to auscultation. Respiratory effort normal. No wheezing or rhonchi.  Cardiovascular system: S1 & S2 heard, RRR. No JVD,. No pedal edema. Gastrointestinal system: Abdomen is soft non tender non distended bowel sounds good.  Central nervous system: Alert and oriented. Grossly non focal.  Extremities: diminished strength in all extremities. No cyanosis or clubbing.  Skin: No rashes, lesions or ulcers Psychiatry: calm, no agitation overnight.     Data Reviewed: I have personally reviewed following labs and imaging studies  CBC: Recent Labs  Lab  12/05/17 0940 12/05/17 1100 12/05/17 1201 12/06/17 0628  WBC 9.4  --   --  8.3  NEUTROABS 7.5  --   --   --   HGB 9.6* 10.5* 8.2* 8.5*  HCT 33.3* 31.0* 24.0* 28.3*  MCV 91.5  --   --  88.7  PLT 449*  --   --  539*   Basic Metabolic Panel: Recent Labs  Lab 12/05/17 0940 12/05/17 1100 12/05/17 1201 12/06/17 0628  NA 138 134* 137 137  K 4.5 4.4 4.1 4.4  CL 105 111 108 109  CO2 20*  --   --  17*  GLUCOSE 100* 100* 110* 254*  BUN 17 21 19 16   CREATININE 1.53* 1.50* 1.30* 1.41*  CALCIUM 11.1*  --   --  10.3   GFR: Estimated Creatinine Clearance: 20.3 mL/min (A) (by C-G formula based on SCr of 1.41 mg/dL (H)). Liver Function Tests: Recent Labs  Lab 12/05/17 0940  AST 16  ALT 9  ALKPHOS 58  BILITOT 0.9  PROT 8.0  ALBUMIN 2.7*   No results for input(s): LIPASE, AMYLASE in the last 168 hours. No results for input(s): AMMONIA in the last 168 hours. Coagulation Profile: Recent Labs  Lab 12/07/17 0625  INR 1.16   Cardiac Enzymes: Recent Labs  Lab 12/05/17 1155 12/06/17 2052  TROPONINI <0.03 0.03*   BNP (last 3 results) No results for input(s): PROBNP in the last 8760 hours. HbA1C: No results for input(s): HGBA1C in the last 72 hours. CBG: Recent Labs  Lab 12/06/17 0804 12/06/17 1128 12/06/17 1650 12/06/17 2145 12/07/17 0730  GLUCAP 231* 264* 204* 164* 209*   Lipid Profile: No results for input(s): CHOL, HDL, LDLCALC, TRIG, CHOLHDL, LDLDIRECT in the last 72 hours. Thyroid Function Tests: No results for input(s): TSH, T4TOTAL, FREET4, T3FREE, THYROIDAB in the last 72 hours. Anemia Panel: No results for input(s): VITAMINB12, FOLATE, FERRITIN, TIBC, IRON, RETICCTPCT in the last 72 hours. Sepsis Labs: Recent Labs  Lab 12/05/17 1100 12/05/17 1202  LATICACIDVEN 1.49 0.63    Recent Results (from the past 240 hour(s))  MRSA PCR Screening     Status: Abnormal   Collection Time: 12/06/17  6:31 AM  Result Value Ref Range Status   MRSA by PCR POSITIVE (A)  NEGATIVE Final    Comment:        The GeneXpert MRSA Assay (FDA approved for NASAL specimens only), is one component of a comprehensive MRSA colonization surveillance program. It is not intended to diagnose MRSA infection nor to guide or monitor treatment for MRSA infections. RESULT CALLED TO, READ BACK BY AND VERIFIED WITH: Marta Antu RN 10:25 12/06/17 ([email or web address]) Performed at [facility], [place] [address], [place], [place] [address]          Radiology Studies: Ct Head Wo Contrast  Result  Date: 12/05/2017 CLINICAL DATA:  Altered level of consciousness. EXAM: CT HEAD WITHOUT CONTRAST TECHNIQUE: Contiguous axial images were obtained from the base of the skull through the vertex without intravenous contrast. COMPARISON:  CT scan of September 20, 2016. FINDINGS: Brain: Mild chronic ischemic white matter disease is noted. No mass effect or midline shift is noted. Ventricular size is within normal limits. There is no evidence of mass lesion, hemorrhage or acute infarction. Vascular: No hyperdense vessel or unexpected calcification. Skull: Normal. Negative for fracture or focal lesion. Sinuses/Orbits: No acute finding. Other: None. IMPRESSION: Mild chronic ischemic white matter disease. No acute intracranial abnormality seen. Electronically Signed   By: Marijo Conception, M.D.   On: 12/05/2017 12:03   Mr Brain Wo Contrast (neuro Protocol)  Result Date: 12/05/2017 CLINICAL DATA:  84 year old female with altered mental status. EXAM: MRI HEAD WITHOUT CONTRAST TECHNIQUE: Multiplanar, multiecho pulse sequences of the brain and surrounding structures were obtained without intravenous contrast. COMPARISON:  Head CT without contrast 1140 hours today. Brain MRI 09/20/2016. FINDINGS: Brain: No restricted diffusion to suggest acute infarction. No midline shift, mass effect, evidence of mass lesion, ventriculomegaly, extra-axial collection or acute intracranial hemorrhage. Cervicomedullary junction and  pituitary are within normal limits. Chronic lacunar infarcts in the brainstem and bilateral deep gray matter nuclei. Chronic confluent bilateral cerebral white matter T2 and FLAIR hyperintensity. Chronic small area of cortical encephalomalacia at the right motor strip. Chronic microhemorrhage in the lateral right thalamus. No new signal abnormality.  Stable cerebral volume since 2018. Vascular: Major intracranial vascular flow voids are stable since 2018. Skull and upper cervical spine: Negative visible cervical spine. Visualized bone marrow signal is within normal limits. Sinuses/Orbits: Stable and negative. Other: Mastoids remain clear. Visible internal auditory structures appear normal. Scalp and face soft tissues appear negative. IMPRESSION: 1.  No acute intracranial abnormality. 2. Advanced chronic ischemic disease appears stable since 2018. Electronically Signed   By: Genevie Ann M.D.   On: 12/05/2017 17:00   Dg Chest Port 1 View  Result Date: 12/05/2017 CLINICAL DATA:  Altered mental status, difficulty with speech EXAM: PORTABLE CHEST 1 VIEW COMPARISON:  Portable chest x-ray of 11/09/2016 FINDINGS: No active infiltrate or effusion is seen. Mediastinal and hilar contours are unremarkable. Mild cardiomegaly is stable as is the ectatic descending thoracic aorta. Posttraumatic degenerative change of the right humeral neck is again noted. IMPRESSION: 1. No active lung disease. 2. Stable mild cardiomegaly. Electronically Signed   By: Ivar Drape M.D.   On: 12/05/2017 10:00        Scheduled Meds: . amLODipine  10 mg Oral Daily  . atorvastatin  40 mg Oral q1800  . Chlorhexidine Gluconate Cloth  6 each Topical Q0600  . cholecalciferol  400 Units Oral Daily  . ferrous sulfate  300 mg Oral Daily  . insulin aspart  0-5 Units Subcutaneous QHS  . insulin aspart  0-9 Units Subcutaneous TID WC  . isosorbide mononitrate  30 mg Oral Daily  . metoprolol tartrate  2.5 mg Intravenous Q6H  . mirtazapine  7.5 mg  Oral QHS  . multivitamin with minerals  1 tablet Oral Daily  . mupirocin ointment  1 application Nasal BID  . pantoprazole  40 mg Oral BID  . polyethylene glycol  17 g Oral Daily  . senna  1 tablet Oral QHS   Continuous Infusions: .  ceFAZolin (ANCEF) IV    . cefTRIAXone (ROCEPHIN)  IV 1 g (12/06/17 1211)  . dextrose 5 % and 0.9%  NaCl 40 mL/hr at 12/06/17 0834     LOS: 0 days    Time spent: 35 minutes.     Hosie Poisson, MD [facility] Pager [phone]  If 7PM-7AM, please contact night-coverage [email or web address] Password [email or web address] 12/07/2017, 9:40 AM

## 2017-12-07 NOTE — Progress Notes (Signed)
Spoke with pts nurse, Verline Lema. Pt can eat tonight but needs to be NPO at midnight for possible GT placement tomorrow.

## 2017-12-08 DIAGNOSIS — N3000 Acute cystitis without hematuria: Secondary | ICD-10-CM | POA: Diagnosis not present

## 2017-12-08 DIAGNOSIS — N390 Urinary tract infection, site not specified: Secondary | ICD-10-CM | POA: Diagnosis not present

## 2017-12-08 DIAGNOSIS — R471 Dysarthria and anarthria: Secondary | ICD-10-CM | POA: Diagnosis not present

## 2017-12-08 LAB — BASIC METABOLIC PANEL
ANION GAP: 6 (ref 5–15)
BUN: 10 mg/dL (ref 8–23)
CALCIUM: 10.1 mg/dL (ref 8.9–10.3)
CO2: 23 mmol/L (ref 22–32)
Chloride: 113 mmol/L — ABNORMAL HIGH (ref 98–111)
Creatinine, Ser: 1.11 mg/dL — ABNORMAL HIGH (ref 0.44–1.00)
GFR calc non Af Amer: 43 mL/min — ABNORMAL LOW (ref >60)
GFR, EST AFRICAN AMERICAN: 50 mL/min — AB (ref >60)
Glucose, Bld: 206 mg/dL — ABNORMAL HIGH (ref 70–99)
Potassium: 3.3 mmol/L — ABNORMAL LOW (ref 3.5–5.1)
Sodium: 142 mmol/L (ref 135–145)

## 2017-12-08 LAB — CBC
HCT: 24.5 % — ABNORMAL LOW (ref 36.0–46.0)
HEMOGLOBIN: 7.5 g/dL — AB (ref 12.0–15.0)
MCH: 27.2 pg (ref 26.0–34.0)
MCHC: 30.6 g/dL (ref 30.0–36.0)
MCV: 88.8 fL (ref 80.0–100.0)
Platelets: 347 10*3/uL (ref 150–400)
RBC: 2.76 MIL/uL — ABNORMAL LOW (ref 3.87–5.11)
RDW: 17.8 % — AB (ref 11.5–15.5)
WBC: 6.1 10*3/uL (ref 4.0–10.5)
nRBC: 0 % (ref 0.0–0.2)

## 2017-12-08 LAB — GLUCOSE, CAPILLARY
Glucose-Capillary: 187 mg/dL — ABNORMAL HIGH (ref 70–99)
Glucose-Capillary: 165 mg/dL — ABNORMAL HIGH (ref 70–99)
Glucose-Capillary: 156 mg/dL — ABNORMAL HIGH (ref 70–99)
Glucose-Capillary: 170 mg/dL — ABNORMAL HIGH (ref 70–99)

## 2017-12-08 MED ORDER — SODIUM CHLORIDE 0.9 % IV SOLN
1.0000 g | Freq: Three times a day (TID) | INTRAVENOUS | Status: DC
  Administered 2017-12-08 – 2017-12-12 (×12): 1 g via INTRAVENOUS
  Filled 2017-12-08 (×14): qty 1000

## 2017-12-08 MED ORDER — POTASSIUM CHLORIDE CRYS ER 20 MEQ PO TBCR
40.0000 meq | EXTENDED_RELEASE_TABLET | Freq: Once | ORAL | Status: AC
  Administered 2017-12-08: 40 meq via ORAL
  Filled 2017-12-08: qty 2

## 2017-12-08 NOTE — Progress Notes (Signed)
PROGRESS NOTE    [name]  OJJ:[id number] DOB: Apr 06, 1927 DOA: 12/05/2017 PCP: Deland Pretty, MD    Brief Narrative:  [name] is a 84 y.o. female with medical history significant for DM2, HTN, HLD, CAD s/p CABG, h/o CVA, CKD3, who was recently diagnosed with achalasia after a 50 lb weight loss over the past year. She was treated with botox earlier in October and sent to SNF with the hopes she would be able to swallow her food. She has not done well since that time, has only been able to drink small amounts of milk. She was noticed to be stuttering, having difficulty with articulation. And was sent to [facility] for evaluation of stroke. MRi brain was negative for CVA.  Meanwhile her oral intake has been very low and she is losing weight, and her son at bedside requesting PEG Placement. IR consulted.   Assessment & Plan:   Principal Problem:   Dysarthria Active Problems:   Diabetes mellitus ([place])   Hyperlipidemia   Essential hypertension   Coronary atherosclerosis   Stroke (cerebrum) (HCC)   Stage 3 chronic kidney disease (HCC)   Acute lower UTI   Failure to thrive in adult   UTI (urinary tract infection)   Dysarthria:  Probably from UTI, urine cultures grew enterococcus we will discontinue IV Rocephin change it to IV ampicillin. MRI is negative for CVA.  Non focal neuro exam.     H/o Achalasia with 50 lb weight loss.  Failed treatment with botox.  IR consulted for PEG placement. Plan to place today.    Mild dehydration:  - gentle hydrate with IV fluids    Diabetes mellitus;  CBG (last 3)  Recent Labs    12/07/17 2010 12/08/17 0804 12/08/17 1154  GLUCAP 166* 187* 165*   Resume SSI.  No change in medications   Hyperlipidemia: Resume statin.    Hypertension Better control today   CAD:  No chest pain or sob at this time.     STAGE 3 CKD  Creatinine improved to 1.1, baseline of 1.4   Anemia of chronic disease Hemoglobin of 10 on admission, is  probably a hemo concentrated sample from dehydration.  Hemoglobin dropped to 7.5,  transfuse to keep hemoglobin greater than 7  recheck CBC in the morning.   Failure to thrive:  Nutrition consulted for recommendations.  Plan to start tube feeds from tomorrow.    Chest pain , resolved within minutes: Abnormal EKG with st t wave changes and troponin is 0.03.Marland Kitchen  EKG shows normal sinus rhythm.. And continue with NTG as needed.    Hypokalemia Supplementation ordered.   DVT prophylaxis: HEPARIN SQ Code Status: full code.  Family Communication:son at bedside.  Disposition Plan: pending clinical improvement.    Consultants:   IR    Procedures: IR consulted for PEG   Antimicrobials: ROCEPHIN SINCE ADMISSION FOR UTI.     Subjective: Patient comfortable denies any chest pain nausea or vomiting no abdominal pain  Objective: Vitals:   12/07/17 1217 12/07/17 1700 12/07/17 2009 12/08/17 0626  BP: (!) 147/82 (!) 146/80 (!) 143/74 (!) 150/84  Pulse: (!) 103 (!) 101 96 100  Resp:  18 16 15   Temp:  98 F (36.7 C) 98.6 F (37 C) 98.4 F (36.9 C)  TempSrc:  Oral    SpO2:  100% 100% 99%  Weight:   46.6 kg     Intake/Output Summary (Last 24 hours) at 12/08/2017 1526 Last data filed at 12/08/2017 1400 Gross per  24 hour  Intake 1560.21 ml  Output 0 ml  Net 1560.21 ml   Filed Weights   12/05/17 2013 12/07/17 2009  Weight: 46.7 kg 46.6 kg    Examination:  General exam: Calm and comfortable not in any kind of distress Respiratory system: Air entry bilateral no wheezing or rhonchi Cardiovascular system: S1 & S2 heard, RRR. No JVD,. No pedal edema. Gastrointestinal system: Abdomen is soft, nontender, nondistended with good bowel sounds Central nervous system: Alert and oriented. Grossly non focal.  Extremities: No pedal edema, cyanosis or clubbing Skin: No rashes, lesions or ulcers Psychiatry: calm, no agitation overnight.     Data Reviewed: I have personally reviewed  following labs and imaging studies  CBC: Recent Labs  Lab 12/05/17 0940 12/05/17 1100 12/05/17 1201 12/06/17 0628 12/08/17 0840  WBC 9.4  --   --  8.3 6.1  NEUTROABS 7.5  --   --   --   --   HGB 9.6* 10.5* 8.2* 8.5* 7.5*  HCT 33.3* 31.0* 24.0* 28.3* 24.5*  MCV 91.5  --   --  88.7 88.8  PLT 449*  --   --  470* 423   Basic Metabolic Panel: Recent Labs  Lab 12/05/17 0940 12/05/17 1100 12/05/17 1201 12/06/17 0628 12/08/17 0840  NA 138 134* 137 137 142  K 4.5 4.4 4.1 4.4 3.3*  CL 105 111 108 109 113*  CO2 20*  --   --  17* 23  GLUCOSE 100* 100* 110* 254* 206*  BUN 17 21 19 16 10   CREATININE 1.53* 1.50* 1.30* 1.41* 1.11*  CALCIUM 11.1*  --   --  10.3 10.1   GFR: Estimated Creatinine Clearance: 25.8 mL/min (A) (by C-G formula based on SCr of 1.11 mg/dL (H)). Liver Function Tests: Recent Labs  Lab 12/05/17 0940  AST 16  ALT 9  ALKPHOS 58  BILITOT 0.9  PROT 8.0  ALBUMIN 2.7*   No results for input(s): LIPASE, AMYLASE in the last 168 hours. No results for input(s): AMMONIA in the last 168 hours. Coagulation Profile: Recent Labs  Lab 12/07/17 0625  INR 1.16   Cardiac Enzymes: Recent Labs  Lab 12/05/17 1155 12/06/17 2052  TROPONINI <0.03 0.03*   BNP (last 3 results) No results for input(s): PROBNP in the last 8760 hours. HbA1C: No results for input(s): HGBA1C in the last 72 hours. CBG: Recent Labs  Lab 12/07/17 1120 12/07/17 1635 12/07/17 2010 12/08/17 0804 12/08/17 1154  GLUCAP 182* 133* 166* 187* 165*   Lipid Profile: No results for input(s): CHOL, HDL, LDLCALC, TRIG, CHOLHDL, LDLDIRECT in the last 72 hours. Thyroid Function Tests: No results for input(s): TSH, T4TOTAL, FREET4, T3FREE, THYROIDAB in the last 72 hours. Anemia Panel: No results for input(s): VITAMINB12, FOLATE, FERRITIN, TIBC, IRON, RETICCTPCT in the last 72 hours. Sepsis Labs: Recent Labs  Lab 12/05/17 1100 12/05/17 1202  LATICACIDVEN 1.49 0.63    Recent Results (from the  past 240 hour(s))  Urine Culture     Status: Abnormal (Preliminary result)   Collection Time: 12/05/17 12:40 PM  Result Value Ref Range Status   Specimen Description URINE, RANDOM  Final   Special Requests   Final    NONE Performed at [facility], [place] [address], [place], [place] [address]    Culture >=100,000 COLONIES/mL ENTEROCOCCUS FAECALIS (A)  Final   Report Status PENDING  Incomplete  MRSA PCR Screening     Status: Abnormal   Collection Time: 12/06/17  6:31 AM  Result  Value Ref Range Status   MRSA by PCR POSITIVE (A) NEGATIVE Final    Comment:        The GeneXpert MRSA Assay (FDA approved for NASAL specimens only), is one component of a comprehensive MRSA colonization surveillance program. It is not intended to diagnose MRSA infection nor to guide or monitor treatment for MRSA infections. RESULT CALLED TO, READ BACK BY AND VERIFIED WITH: Marta Antu RN 10:25 12/06/17 ([email or web address]) Performed at [facility], [place] [address], [place], [place] [address]          Radiology Studies: No results found.      Scheduled Meds: . amLODipine  10 mg Oral Daily  . atorvastatin  40 mg Oral q1800  . Chlorhexidine Gluconate Cloth  6 each Topical Q0600  . cholecalciferol  400 Units Oral Daily  . ferrous sulfate  300 mg Oral Daily  . insulin aspart  0-5 Units Subcutaneous QHS  . insulin aspart  0-9 Units Subcutaneous TID WC  . isosorbide mononitrate  30 mg Oral Daily  . metoprolol tartrate  2.5 mg Intravenous Q6H  . mirtazapine  7.5 mg Oral QHS  . multivitamin with minerals  1 tablet Oral Daily  . mupirocin ointment  1 application Nasal BID  . pantoprazole  40 mg Oral BID  . polyethylene glycol  17 g Oral Daily  . senna  1 tablet Oral QHS   Continuous Infusions: . cefTRIAXone (ROCEPHIN)  IV 1 g (12/08/17 1324)  . dextrose 5 % and 0.9% NaCl 40 mL/hr at 12/06/17 0834     LOS: 0 days    Time spent: 35 minutes.     Hosie Poisson, MD [facility] Pager  [phone]  If 7PM-7AM, please contact night-coverage [email or web address] Password [facility] 12/08/2017, 3:26 PM

## 2017-12-08 NOTE — Progress Notes (Signed)
Patient refusing PO meds, MD paged regarding potassium, pharmacy unable to change order.

## 2017-12-09 ENCOUNTER — Inpatient Hospital Stay: Payer: Self-pay

## 2017-12-09 DIAGNOSIS — R627 Adult failure to thrive: Secondary | ICD-10-CM | POA: Diagnosis not present

## 2017-12-09 DIAGNOSIS — N183 Chronic kidney disease, stage 3 (moderate): Secondary | ICD-10-CM | POA: Diagnosis not present

## 2017-12-09 DIAGNOSIS — N3000 Acute cystitis without hematuria: Secondary | ICD-10-CM | POA: Diagnosis present

## 2017-12-09 DIAGNOSIS — R229 Localized swelling, mass and lump, unspecified: Secondary | ICD-10-CM | POA: Diagnosis not present

## 2017-12-09 DIAGNOSIS — I5022 Chronic systolic (congestive) heart failure: Secondary | ICD-10-CM | POA: Diagnosis not present

## 2017-12-09 DIAGNOSIS — R4182 Altered mental status, unspecified: Secondary | ICD-10-CM | POA: Diagnosis not present

## 2017-12-09 DIAGNOSIS — I69354 Hemiplegia and hemiparesis following cerebral infarction affecting left non-dominant side: Secondary | ICD-10-CM | POA: Diagnosis not present

## 2017-12-09 DIAGNOSIS — N178 Other acute kidney failure: Secondary | ICD-10-CM | POA: Diagnosis not present

## 2017-12-09 DIAGNOSIS — E870 Hyperosmolality and hypernatremia: Secondary | ICD-10-CM | POA: Diagnosis not present

## 2017-12-09 DIAGNOSIS — N390 Urinary tract infection, site not specified: Secondary | ICD-10-CM | POA: Diagnosis not present

## 2017-12-09 DIAGNOSIS — I361 Nonrheumatic tricuspid (valve) insufficiency: Secondary | ICD-10-CM | POA: Diagnosis not present

## 2017-12-09 DIAGNOSIS — G9341 Metabolic encephalopathy: Secondary | ICD-10-CM | POA: Diagnosis not present

## 2017-12-09 DIAGNOSIS — E1159 Type 2 diabetes mellitus with other circulatory complications: Secondary | ICD-10-CM | POA: Diagnosis not present

## 2017-12-09 DIAGNOSIS — I10 Essential (primary) hypertension: Secondary | ICD-10-CM | POA: Diagnosis not present

## 2017-12-09 DIAGNOSIS — N170 Acute kidney failure with tubular necrosis: Secondary | ICD-10-CM | POA: Diagnosis not present

## 2017-12-09 DIAGNOSIS — I429 Cardiomyopathy, unspecified: Secondary | ICD-10-CM | POA: Diagnosis not present

## 2017-12-09 DIAGNOSIS — N179 Acute kidney failure, unspecified: Secondary | ICD-10-CM | POA: Diagnosis present

## 2017-12-09 DIAGNOSIS — R1310 Dysphagia, unspecified: Secondary | ICD-10-CM | POA: Diagnosis not present

## 2017-12-09 DIAGNOSIS — I5043 Acute on chronic combined systolic (congestive) and diastolic (congestive) heart failure: Secondary | ICD-10-CM | POA: Diagnosis present

## 2017-12-09 DIAGNOSIS — I5041 Acute combined systolic (congestive) and diastolic (congestive) heart failure: Secondary | ICD-10-CM | POA: Diagnosis not present

## 2017-12-09 DIAGNOSIS — E43 Unspecified severe protein-calorie malnutrition: Secondary | ICD-10-CM | POA: Diagnosis present

## 2017-12-09 DIAGNOSIS — J9601 Acute respiratory failure with hypoxia: Secondary | ICD-10-CM | POA: Diagnosis not present

## 2017-12-09 DIAGNOSIS — Z681 Body mass index (BMI) 19 or less, adult: Secondary | ICD-10-CM | POA: Diagnosis not present

## 2017-12-09 DIAGNOSIS — I480 Paroxysmal atrial fibrillation: Secondary | ICD-10-CM | POA: Diagnosis not present

## 2017-12-09 DIAGNOSIS — T85598A Other mechanical complication of other gastrointestinal prosthetic devices, implants and grafts, initial encounter: Secondary | ICD-10-CM | POA: Diagnosis not present

## 2017-12-09 DIAGNOSIS — R609 Edema, unspecified: Secondary | ICD-10-CM | POA: Diagnosis not present

## 2017-12-09 DIAGNOSIS — E1122 Type 2 diabetes mellitus with diabetic chronic kidney disease: Secondary | ICD-10-CM | POA: Diagnosis not present

## 2017-12-09 DIAGNOSIS — Z515 Encounter for palliative care: Secondary | ICD-10-CM | POA: Diagnosis not present

## 2017-12-09 DIAGNOSIS — I5033 Acute on chronic diastolic (congestive) heart failure: Secondary | ICD-10-CM | POA: Diagnosis not present

## 2017-12-09 DIAGNOSIS — I25118 Atherosclerotic heart disease of native coronary artery with other forms of angina pectoris: Secondary | ICD-10-CM | POA: Diagnosis not present

## 2017-12-09 DIAGNOSIS — R0603 Acute respiratory distress: Secondary | ICD-10-CM | POA: Diagnosis not present

## 2017-12-09 DIAGNOSIS — I214 Non-ST elevation (NSTEMI) myocardial infarction: Secondary | ICD-10-CM | POA: Diagnosis not present

## 2017-12-09 DIAGNOSIS — I340 Nonrheumatic mitral (valve) insufficiency: Secondary | ICD-10-CM | POA: Diagnosis not present

## 2017-12-09 DIAGNOSIS — Z7189 Other specified counseling: Secondary | ICD-10-CM | POA: Diagnosis not present

## 2017-12-09 DIAGNOSIS — I130 Hypertensive heart and chronic kidney disease with heart failure and stage 1 through stage 4 chronic kidney disease, or unspecified chronic kidney disease: Secondary | ICD-10-CM | POA: Diagnosis present

## 2017-12-09 DIAGNOSIS — I370 Nonrheumatic pulmonary valve stenosis: Secondary | ICD-10-CM | POA: Diagnosis not present

## 2017-12-09 DIAGNOSIS — A419 Sepsis, unspecified organism: Secondary | ICD-10-CM | POA: Diagnosis not present

## 2017-12-09 DIAGNOSIS — J810 Acute pulmonary edema: Secondary | ICD-10-CM | POA: Diagnosis not present

## 2017-12-09 DIAGNOSIS — R4701 Aphasia: Secondary | ICD-10-CM | POA: Diagnosis not present

## 2017-12-09 DIAGNOSIS — I2510 Atherosclerotic heart disease of native coronary artery without angina pectoris: Secondary | ICD-10-CM | POA: Diagnosis not present

## 2017-12-09 DIAGNOSIS — R599 Enlarged lymph nodes, unspecified: Secondary | ICD-10-CM | POA: Diagnosis not present

## 2017-12-09 DIAGNOSIS — R471 Dysarthria and anarthria: Secondary | ICD-10-CM | POA: Diagnosis not present

## 2017-12-09 DIAGNOSIS — E1151 Type 2 diabetes mellitus with diabetic peripheral angiopathy without gangrene: Secondary | ICD-10-CM | POA: Diagnosis not present

## 2017-12-09 DIAGNOSIS — N19 Unspecified kidney failure: Secondary | ICD-10-CM | POA: Diagnosis not present

## 2017-12-09 DIAGNOSIS — J150 Pneumonia due to Klebsiella pneumoniae: Secondary | ICD-10-CM | POA: Diagnosis not present

## 2017-12-09 DIAGNOSIS — N186 End stage renal disease: Secondary | ICD-10-CM | POA: Diagnosis not present

## 2017-12-09 DIAGNOSIS — R0602 Shortness of breath: Secondary | ICD-10-CM | POA: Diagnosis not present

## 2017-12-09 DIAGNOSIS — I639 Cerebral infarction, unspecified: Secondary | ICD-10-CM | POA: Diagnosis not present

## 2017-12-09 DIAGNOSIS — J690 Pneumonitis due to inhalation of food and vomit: Secondary | ICD-10-CM | POA: Diagnosis not present

## 2017-12-09 DIAGNOSIS — I6930 Unspecified sequelae of cerebral infarction: Secondary | ICD-10-CM | POA: Diagnosis not present

## 2017-12-09 DIAGNOSIS — J9811 Atelectasis: Secondary | ICD-10-CM | POA: Diagnosis not present

## 2017-12-09 DIAGNOSIS — E785 Hyperlipidemia, unspecified: Secondary | ICD-10-CM | POA: Diagnosis not present

## 2017-12-09 DIAGNOSIS — I6381 Other cerebral infarction due to occlusion or stenosis of small artery: Secondary | ICD-10-CM | POA: Diagnosis not present

## 2017-12-09 DIAGNOSIS — R601 Generalized edema: Secondary | ICD-10-CM | POA: Diagnosis not present

## 2017-12-09 DIAGNOSIS — R404 Transient alteration of awareness: Secondary | ICD-10-CM | POA: Diagnosis not present

## 2017-12-09 DIAGNOSIS — J9621 Acute and chronic respiratory failure with hypoxia: Secondary | ICD-10-CM | POA: Diagnosis not present

## 2017-12-09 LAB — CBC
HCT: 25.8 % — ABNORMAL LOW (ref 36.0–46.0)
HEMOGLOBIN: 7.7 g/dL — AB (ref 12.0–15.0)
MCH: 27.1 pg (ref 26.0–34.0)
MCHC: 29.8 g/dL — AB (ref 30.0–36.0)
MCV: 90.8 fL (ref 80.0–100.0)
NRBC: 0 % (ref 0.0–0.2)
Platelets: 278 10*3/uL (ref 150–400)
RBC: 2.84 MIL/uL — AB (ref 3.87–5.11)
RDW: 17.7 % — ABNORMAL HIGH (ref 11.5–15.5)
WBC: 6.5 10*3/uL (ref 4.0–10.5)

## 2017-12-09 LAB — BASIC METABOLIC PANEL
ANION GAP: 7 (ref 5–15)
BUN: 10 mg/dL (ref 8–23)
CHLORIDE: 112 mmol/L — AB (ref 98–111)
CO2: 20 mmol/L — ABNORMAL LOW (ref 22–32)
CREATININE: 1.11 mg/dL — AB (ref 0.44–1.00)
Calcium: 10 mg/dL (ref 8.9–10.3)
GFR calc non Af Amer: 43 mL/min — ABNORMAL LOW (ref >60)
GFR, EST AFRICAN AMERICAN: 50 mL/min — AB (ref >60)
Glucose, Bld: 181 mg/dL — ABNORMAL HIGH (ref 70–99)
POTASSIUM: 3.8 mmol/L (ref 3.5–5.1)
SODIUM: 139 mmol/L (ref 135–145)

## 2017-12-09 LAB — GLUCOSE, CAPILLARY
Glucose-Capillary: 157 mg/dL — ABNORMAL HIGH (ref 70–99)
Glucose-Capillary: 172 mg/dL — ABNORMAL HIGH (ref 70–99)
Glucose-Capillary: 171 mg/dL — ABNORMAL HIGH (ref 70–99)
Glucose-Capillary: 164 mg/dL — ABNORMAL HIGH (ref 70–99)

## 2017-12-09 LAB — URINE CULTURE: Culture: >=100000 — AB

## 2017-12-09 MED ORDER — SODIUM CHLORIDE 0.9% FLUSH
10.0000 mL | INTRAVENOUS | Status: DC | PRN
  Administered 2017-12-14 – 2018-01-03 (×6): 10 mL
  Administered 2018-01-03: 40 mL
  Administered 2018-01-07: 20 mL
  Administered 2018-01-16 – 2018-02-18 (×2): 10 mL
  Filled 2017-12-09 (×10): qty 40

## 2017-12-09 NOTE — Progress Notes (Signed)
Peripherally Inserted Central Catheter/Midline Placement  The IV Nurse has discussed with the patient and/or persons authorized to consent for the patient, the purpose of this procedure and the potential benefits and risks involved with this procedure.  The benefits include less needle sticks, lab draws from the catheter, and the patient may be discharged home with the catheter. Risks include, but not limited to, infection, bleeding, blood clot (thrombus formation), and puncture of an artery; nerve damage and irregular heartbeat and possibility to perform a PICC exchange if needed/ordered by physician.  Alternatives to this procedure were also discussed.  Bard Power PICC patient education guide, fact sheet on infection prevention and patient information card has been provided to patient /or left at bedside. Son signed consent. Patient verbalized understanding.    PICC/Midline Placement Documentation  PICC Double Lumen 12/09/17 PICC Right Brachial 35 cm 0 cm (Active)  Indication for Insertion or Continuance of Line Administration of hyperosmolar/irritating solutions (i.e. TPN, Vancomycin, etc.) 12/09/2017  6:00 PM  Exposed Catheter (cm) 0 cm 12/09/2017  6:00 PM  Site Assessment Clean;Dry;Intact 12/09/2017  6:00 PM  Lumen #1 Status Flushed;Blood return noted;Saline locked 12/09/2017  6:00 PM  Lumen #2 Status Flushed;Blood return noted;Saline locked 12/09/2017  6:00 PM  Dressing Type Transparent 12/09/2017  6:00 PM  Dressing Status Clean;Dry;Intact;Antimicrobial disc in place 12/09/2017  6:00 PM  Line Care Connections checked and tightened 12/09/2017  6:00 PM  Line Adjustment (NICU/IV Team Only) No 12/09/2017  6:00 PM  Dressing Intervention New dressing 12/09/2017  6:00 PM  Dressing Change Due 12/16/17 12/09/2017  6:00 PM       [name] 12/09/2017, 6:36 PM

## 2017-12-09 NOTE — Progress Notes (Signed)
PROGRESS NOTE    [name]  IOE:[id number] DOB: 05/16/1927 DOA: 12/05/2017 PCP: Deland Pretty, MD    Brief Narrative:  [name] is a 84 y.o. female with medical history significant for DM2, HTN, HLD, CAD s/p CABG, h/o CVA, CKD3, who was recently diagnosed with achalasia after a 50 lb weight loss over the past year. She was treated with botox earlier in October and sent to SNF with the hopes she would be able to swallow her food. She has not done well since that time, has only been able to drink small amounts of milk. She was noticed to be stuttering, having difficulty with articulation. And was sent to [facility] for evaluation of stroke. MRi brain was negative for CVA.  Meanwhile her oral intake has been very low and she is losing weight, and her son at bedside requesting PEG Placement. IR consulted.  Currently awaiting PEG placement by IR, but IR feels she has a large hiatal hernia which might impede the placement and unfortunately, she has been on the schedule for the last three days and unable to get it due to scheduling reasons. Since she is declining rapidly and has minimal to no oral intake , we will go ahead and start TPN.   Assessment & Plan:   Principal Problem:   Dysarthria Active Problems:   Diabetes mellitus ([place])   Hyperlipidemia   Essential hypertension   Coronary atherosclerosis   Stroke (cerebrum) (HCC)   Stage 3 chronic kidney disease (HCC)   Acute lower UTI   Failure to thrive in adult   UTI (urinary tract infection)   Dysarthria:  Probably from UTI, urine cultures grew enterococcus we will discontinue IV Rocephin change it to IV ampicillin. Enterococcus is sensitive to IV ampicillin.  MRI is negative for CVA.  Non focal neuro exam.     H/o Achalasia with 50 lb weight loss.  Failed treatment with botox.  IR consulted for PEG placement.  She has been on the schedule for get the PEG placed since 3 days and unable to get it due to scheduling issues, meanwhile  patient has no or minimal oral intake and she is declining rapidly. We will go ahead and start the TPN until she gets the Peg placed. PICC line ordered.    Mild dehydration:  - gentle hydrate with IV fluids    Diabetes mellitus;  CBG (last 3)  Recent Labs    12/08/17 1716 12/08/17 2200 12/09/17 0837  GLUCAP 156* 170* 157*   Resume SSI.  No change in medications   Hyperlipidemia: Resume statin.    Hypertension Better control today   CAD:  No chest pain or sob at this time.     STAGE 3 CKD  Creatinine improved to 1.1, baseline of 1.4   Anemia of chronic disease Hemoglobin of 10 on admission, is probably a hemo concentrated sample from dehydration.  Hemoglobin dropped to 7.7,  transfuse to keep hemoglobin greater than 7     Failure to thrive:  Nutrition consulted for recommendations. She has moderate to severe protein malnutrition.  Will start her on TPN and watch for refeeding syndrome.    Chest pain , resolved within minutes: Initial EKG with st t wave changes,  and troponin is 0.03.  Repeat  EKG shows normal sinus rhythm. No chest pain so far.    Hypokalemia Supplementation ordered.   DVT prophylaxis: HEPARIN SQ Code Status: full code.  Family Communication:son at bedside.  Disposition Plan: pending PEG placement.  Consultants:   IR    Procedures: IR consulted for PEG   Antimicrobials: ampicillin since 11/8    Subjective: Pt laying in bed. Cachetic looking . No complaints at this time.   Objective: Vitals:   12/08/17 1714 12/08/17 1956 12/09/17 0615 12/09/17 0947  BP: (!) 164/82 (!) 165/80 (!) 156/74 137/74  Pulse: 92 87 86 87  Resp: 18 16 16 15   Temp: 98.5 F (36.9 C) 98.1 F (36.7 C) 98.7 F (37.1 C) 98.3 F (36.8 C)  TempSrc: Oral Oral Oral Oral  SpO2: 100% 100% 100% 100%  Weight:        Intake/Output Summary (Last 24 hours) at 12/09/2017 1135 Last data filed at 12/09/2017 0900 Gross per 24 hour  Intake 811.92 ml    Output 300 ml  Net 511.92 ml   Filed Weights   12/05/17 2013 12/07/17 2009  Weight: 46.7 kg 46.6 kg    Examination:  General exam: cachetic and frail looking lady, not in distress.  Respiratory system: good air entry bilateral. No wheezing or rhonchi.  Cardiovascular system: S1 & S2 heard, RRR. No JVD,. No pedal edema. Gastrointestinal system: Abdomen is soft, NT ND BS+ Central nervous system: Alert , ANSWERING questions appropriately.  Extremities: No pedal edema, cyanosis or clubbing Skin: No rashes, lesions or ulcers Psychiatry: calm, no agitation overnight.     Data Reviewed: I have personally reviewed following labs and imaging studies  CBC: Recent Labs  Lab 12/05/17 0940 12/05/17 1100 12/05/17 1201 12/06/17 0628 12/08/17 0840 12/09/17 0514  WBC 9.4  --   --  8.3 6.1 6.5  NEUTROABS 7.5  --   --   --   --   --   HGB 9.6* 10.5* 8.2* 8.5* 7.5* 7.7*  HCT 33.3* 31.0* 24.0* 28.3* 24.5* 25.8*  MCV 91.5  --   --  88.7 88.8 90.8  PLT 449*  --   --  470* 347 790   Basic Metabolic Panel: Recent Labs  Lab 12/05/17 0940 12/05/17 1100 12/05/17 1201 12/06/17 0628 12/08/17 0840 12/09/17 0514  NA 138 134* 137 137 142 139  K 4.5 4.4 4.1 4.4 3.3* 3.8  CL 105 111 108 109 113* 112*  CO2 20*  --   --  17* 23 20*  GLUCOSE 100* 100* 110* 254* 206* 181*  BUN 17 21 19 16 10 10   CREATININE 1.53* 1.50* 1.30* 1.41* 1.11* 1.11*  CALCIUM 11.1*  --   --  10.3 10.1 10.0   GFR: Estimated Creatinine Clearance: 25.8 mL/min (A) (by C-G formula based on SCr of 1.11 mg/dL (H)). Liver Function Tests: Recent Labs  Lab 12/05/17 0940  AST 16  ALT 9  ALKPHOS 58  BILITOT 0.9  PROT 8.0  ALBUMIN 2.7*   No results for input(s): LIPASE, AMYLASE in the last 168 hours. No results for input(s): AMMONIA in the last 168 hours. Coagulation Profile: Recent Labs  Lab 12/07/17 0625  INR 1.16   Cardiac Enzymes: Recent Labs  Lab 12/05/17 1155 12/06/17 2052  TROPONINI <0.03 0.03*   BNP  (last 3 results) No results for input(s): PROBNP in the last 8760 hours. HbA1C: No results for input(s): HGBA1C in the last 72 hours. CBG: Recent Labs  Lab 12/08/17 0804 12/08/17 1154 12/08/17 1716 12/08/17 2200 12/09/17 0837  GLUCAP 187* 165* 156* 170* 157*   Lipid Profile: No results for input(s): CHOL, HDL, LDLCALC, TRIG, CHOLHDL, LDLDIRECT in the last 72 hours. Thyroid Function Tests: No results for input(s): TSH, T4TOTAL,  FREET4, T3FREE, THYROIDAB in the last 72 hours. Anemia Panel: No results for input(s): VITAMINB12, FOLATE, FERRITIN, TIBC, IRON, RETICCTPCT in the last 72 hours. Sepsis Labs: Recent Labs  Lab 12/05/17 1100 12/05/17 1202  LATICACIDVEN 1.49 0.63    Recent Results (from the past 240 hour(s))  Urine Culture     Status: Abnormal   Collection Time: 12/05/17 12:40 PM  Result Value Ref Range Status   Specimen Description URINE, RANDOM  Final   Special Requests   Final    NONE Performed at [facility], [place] [address] [place], [place] [address]    Culture >=100,000 COLONIES/mL ENTEROCOCCUS FAECALIS (A)  Final   Report Status 12/09/2017 FINAL  Final   Organism ID, Bacteria ENTEROCOCCUS FAECALIS (A)  Final      Susceptibility   Enterococcus faecalis - MIC*    AMPICILLIN <=2 SENSITIVE Sensitive     LEVOFLOXACIN 1 SENSITIVE Sensitive     NITROFURANTOIN <=16 SENSITIVE Sensitive     VANCOMYCIN 1 SENSITIVE Sensitive     * >=100,000 COLONIES/mL ENTEROCOCCUS FAECALIS  MRSA PCR Screening     Status: Abnormal   Collection Time: 12/06/17  6:31 AM  Result Value Ref Range Status   MRSA by PCR POSITIVE (A) NEGATIVE Final    Comment:        The GeneXpert MRSA Assay (FDA approved for NASAL specimens only), is one component of a comprehensive MRSA colonization surveillance program. It is not intended to diagnose MRSA infection nor to guide or monitor treatment for MRSA infections. RESULT CALLED TO, READ BACK BY AND VERIFIED WITH: Marta Antu RN 10:25  12/06/17 ([email or web address]) Performed at [facility], [place] [address] [place], [place] [address]          Radiology Studies: No results found.      Scheduled Meds: . amLODipine  10 mg Oral Daily  . atorvastatin  40 mg Oral q1800  . Chlorhexidine Gluconate Cloth  6 each Topical Q0600  . cholecalciferol  400 Units Oral Daily  . ferrous sulfate  300 mg Oral Daily  . insulin aspart  0-5 Units Subcutaneous QHS  . insulin aspart  0-9 Units Subcutaneous TID WC  . isosorbide mononitrate  30 mg Oral Daily  . metoprolol tartrate  2.5 mg Intravenous Q6H  . mirtazapine  7.5 mg Oral QHS  . multivitamin with minerals  1 tablet Oral Daily  . mupirocin ointment  1 application Nasal BID  . pantoprazole  40 mg Oral BID  . polyethylene glycol  17 g Oral Daily  . senna  1 tablet Oral QHS   Continuous Infusions: . ampicillin (OMNIPEN) IV 1 g (12/09/17 0615)  . dextrose 5 % and 0.9% NaCl 40 mL/hr at 12/08/17 2223     LOS: 0 days    Time spent: 35 minutes.     Hosie Poisson, MD [facility] Pager [phone]  If 7PM-7AM, please contact night-coverage [email or web address] Password [email or web address] 12/09/2017, 11:35 AM

## 2017-12-10 ENCOUNTER — Encounter (HOSPITAL_COMMUNITY): Payer: Self-pay | Admitting: Interventional Radiology

## 2017-12-10 ENCOUNTER — Inpatient Hospital Stay (HOSPITAL_COMMUNITY): Payer: Medicare Other

## 2017-12-10 HISTORY — PX: IR GASTROSTOMY TUBE MOD SED: IMG625

## 2017-12-10 LAB — COMPREHENSIVE METABOLIC PANEL
ALT: 7 U/L (ref 0–44)
AST: 11 U/L — ABNORMAL LOW (ref 15–41)
Albumin: 2 g/dL — ABNORMAL LOW (ref 3.5–5.0)
Alkaline Phosphatase: 44 U/L (ref 38–126)
Anion gap: 4 — ABNORMAL LOW (ref 5–15)
BUN: 10 mg/dL (ref 8–23)
CHLORIDE: 113 mmol/L — AB (ref 98–111)
CO2: 23 mmol/L (ref 22–32)
CREATININE: 1.13 mg/dL — AB (ref 0.44–1.00)
Calcium: 9.7 mg/dL (ref 8.9–10.3)
GFR, EST AFRICAN AMERICAN: 49 mL/min — AB (ref >60)
GFR, EST NON AFRICAN AMERICAN: 42 mL/min — AB (ref >60)
Glucose, Bld: 198 mg/dL — ABNORMAL HIGH (ref 70–99)
Potassium: 3.5 mmol/L (ref 3.5–5.1)
SODIUM: 140 mmol/L (ref 135–145)
Total Bilirubin: 0.7 mg/dL (ref 0.3–1.2)
Total Protein: 6.2 g/dL — ABNORMAL LOW (ref 6.5–8.1)

## 2017-12-10 LAB — GLUCOSE, CAPILLARY
Glucose-Capillary: 124 mg/dL — ABNORMAL HIGH (ref 70–99)
Glucose-Capillary: 159 mg/dL — ABNORMAL HIGH (ref 70–99)
Glucose-Capillary: 159 mg/dL — ABNORMAL HIGH (ref 70–99)
Glucose-Capillary: 228 mg/dL — ABNORMAL HIGH (ref 70–99)

## 2017-12-10 LAB — MAGNESIUM: MAGNESIUM: 1.5 mg/dL — AB (ref 1.7–2.4)

## 2017-12-10 LAB — PHOSPHORUS: PHOSPHORUS: 2.4 mg/dL — AB (ref 2.5–4.6)

## 2017-12-10 MED ORDER — GLUCAGON HCL RDNA (DIAGNOSTIC) 1 MG IJ SOLR
INTRAMUSCULAR | Status: AC
  Filled 2017-12-10: qty 1

## 2017-12-10 MED ORDER — LIDOCAINE HCL 1 % IJ SOLN
INTRAMUSCULAR | Status: AC
  Filled 2017-12-10: qty 20

## 2017-12-10 MED ORDER — HEPARIN SODIUM (PORCINE) 5000 UNIT/ML IJ SOLN
5000.0000 [IU] | Freq: Three times a day (TID) | INTRAMUSCULAR | Status: DC
  Administered 2017-12-10 – 2017-12-19 (×14): 5000 [IU] via SUBCUTANEOUS
  Filled 2017-12-10 (×13): qty 1

## 2017-12-10 MED ORDER — GLUCAGON HCL RDNA (DIAGNOSTIC) 1 MG IJ SOLR
INTRAMUSCULAR | Status: DC | PRN
  Administered 2017-12-10: 1 mg via INTRAVENOUS
  Administered 2017-12-21: 1 mg

## 2017-12-10 MED ORDER — FREE WATER: 100.0000 mL | Freq: Three times a day (TID) | Status: DC

## 2017-12-10 MED ORDER — FENTANYL CITRATE (PF) 100 MCG/2ML IJ SOLN
INTRAMUSCULAR | Status: DC | PRN
  Administered 2017-12-10 (×2): 25 ug via INTRAVENOUS

## 2017-12-10 MED ORDER — MIDAZOLAM HCL 2 MG/2ML IJ SOLN
INTRAMUSCULAR | Status: DC | PRN
  Administered 2017-12-10 (×3): 0.5 mg via INTRAVENOUS

## 2017-12-10 MED ORDER — CEFAZOLIN SODIUM-DEXTROSE 1-4 GM/50ML-% IV SOLN
INTRAVENOUS | Status: DC | PRN
  Administered 2017-12-10: 2 g via INTRAVENOUS

## 2017-12-10 MED ORDER — MIDAZOLAM HCL 2 MG/2ML IJ SOLN
INTRAMUSCULAR | Status: AC
  Filled 2017-12-10: qty 4

## 2017-12-10 MED ORDER — POTASSIUM PHOSPHATES 15 MMOLE/5ML IV SOLN
20.0000 mmol | Freq: Once | INTRAVENOUS | Status: AC
  Administered 2017-12-10: 20 mmol via INTRAVENOUS
  Filled 2017-12-10: qty 6.67

## 2017-12-10 MED ORDER — INSULIN ASPART 100 UNIT/ML ~~LOC~~ SOLN
0.0000 [IU] | SUBCUTANEOUS | Status: DC
  Administered 2017-12-10 (×2): 3 [IU] via SUBCUTANEOUS
  Administered 2017-12-10: 5 [IU] via SUBCUTANEOUS
  Administered 2017-12-10 – 2017-12-11 (×3): 3 [IU] via SUBCUTANEOUS
  Administered 2017-12-11: 2 [IU] via SUBCUTANEOUS
  Administered 2017-12-11: 8 [IU] via SUBCUTANEOUS
  Administered 2017-12-11: 5 [IU] via SUBCUTANEOUS
  Administered 2017-12-11: 2 [IU] via SUBCUTANEOUS
  Administered 2017-12-12 (×2): 8 [IU] via SUBCUTANEOUS
  Administered 2017-12-12 (×2): 11 [IU] via SUBCUTANEOUS
  Administered 2017-12-12: 15 [IU] via SUBCUTANEOUS
  Administered 2017-12-13: 5 [IU] via SUBCUTANEOUS
  Administered 2017-12-13: 11 [IU] via SUBCUTANEOUS
  Administered 2017-12-13: 15 [IU] via SUBCUTANEOUS
  Administered 2017-12-13 (×2): 8 [IU] via SUBCUTANEOUS
  Administered 2017-12-13: 15 [IU] via SUBCUTANEOUS
  Administered 2017-12-14: 8 [IU] via SUBCUTANEOUS
  Administered 2017-12-14 (×5): 5 [IU] via SUBCUTANEOUS
  Administered 2017-12-15: 3 [IU] via SUBCUTANEOUS
  Administered 2017-12-15: 5 [IU] via SUBCUTANEOUS
  Administered 2017-12-15: 8 [IU] via SUBCUTANEOUS
  Administered 2017-12-15: 10 [IU] via SUBCUTANEOUS
  Administered 2017-12-15: 3 [IU] via SUBCUTANEOUS
  Administered 2017-12-15 – 2017-12-16 (×2): 5 [IU] via SUBCUTANEOUS
  Administered 2017-12-16: 3 [IU] via SUBCUTANEOUS
  Administered 2017-12-16: 5 [IU] via SUBCUTANEOUS
  Administered 2017-12-16: 3 [IU] via SUBCUTANEOUS
  Administered 2017-12-16: 5 [IU] via SUBCUTANEOUS
  Administered 2017-12-16: 3 [IU] via SUBCUTANEOUS
  Administered 2017-12-17: 5 [IU] via SUBCUTANEOUS
  Administered 2017-12-17 (×2): 3 [IU] via SUBCUTANEOUS
  Administered 2017-12-18: 5 [IU] via SUBCUTANEOUS
  Administered 2017-12-18: 2 [IU] via SUBCUTANEOUS
  Administered 2017-12-18 (×2): 3 [IU] via SUBCUTANEOUS
  Administered 2017-12-18: 5 [IU] via SUBCUTANEOUS
  Administered 2017-12-18: 2 [IU] via SUBCUTANEOUS
  Administered 2017-12-19: 3 [IU] via SUBCUTANEOUS
  Administered 2017-12-19: 2 [IU] via SUBCUTANEOUS
  Administered 2017-12-19: 3 [IU] via SUBCUTANEOUS
  Administered 2017-12-19: 2 [IU] via SUBCUTANEOUS
  Administered 2017-12-20 (×3): 3 [IU] via SUBCUTANEOUS
  Administered 2017-12-20: 2 [IU] via SUBCUTANEOUS
  Administered 2017-12-20: 3 [IU] via SUBCUTANEOUS
  Administered 2017-12-20: 2 [IU] via SUBCUTANEOUS
  Administered 2017-12-21: 3 [IU] via SUBCUTANEOUS

## 2017-12-10 MED ORDER — MAGNESIUM SULFATE 2 GM/50ML IV SOLN
2.0000 g | Freq: Once | INTRAVENOUS | Status: AC
  Administered 2017-12-10: 2 g via INTRAVENOUS
  Filled 2017-12-10: qty 50

## 2017-12-10 MED ORDER — TRAVASOL 10 % IV SOLN
INTRAVENOUS | Status: AC
  Administered 2017-12-10: 18:00:00 via INTRAVENOUS
  Filled 2017-12-10: qty 374.4

## 2017-12-10 MED ORDER — IOPAMIDOL (ISOVUE-300) INJECTION 61%
INTRAVENOUS | Status: AC
  Administered 2017-12-10: 15 mL
  Filled 2017-12-10: qty 50

## 2017-12-10 MED ORDER — FENTANYL CITRATE (PF) 100 MCG/2ML IJ SOLN
INTRAMUSCULAR | Status: AC
  Filled 2017-12-10: qty 2

## 2017-12-10 MED ORDER — FREE WATER
100.0000 mL | Freq: Three times a day (TID) | Status: DC
  Administered 2017-12-11 – 2017-12-20 (×29): 100 mL

## 2017-12-10 MED ORDER — CEFAZOLIN SODIUM-DEXTROSE 2-4 GM/100ML-% IV SOLN
INTRAVENOUS | Status: AC
  Filled 2017-12-10: qty 100

## 2017-12-10 NOTE — Progress Notes (Signed)
PROGRESS NOTE    [name]  JJO:[id number] DOB: Jun 18, 1927 DOA: 12/05/2017 PCP: Deland Pretty, MD    Brief Narrative:  [name] is a 84 y.o. female with medical history significant for DM2, HTN, HLD, CAD s/p CABG, h/o CVA, CKD3, who was recently diagnosed with achalasia after a 50 lb weight loss over the past year. She was treated with botox earlier in October and sent to SNF with the hopes she would be able to swallow her food. She has not done well since that time, has only been able to drink small amounts of milk. She was noticed to be stuttering, having difficulty with articulation. And was sent to [facility] for evaluation of stroke. MRi brain was negative for CVA.  Meanwhile her oral intake has been very low and she is losing weight, and her son at bedside requesting PEG Placement. IR consulted.  Currently awaiting PEG placement by IR, but IR feels she has a large hiatal hernia which might impede the placement and unfortunately, she has been on the schedule for the last three days and unable to get it due to scheduling reasons. Since she is declining rapidly and has minimal to no oral intake , we will go ahead and start TPN. Continues TPN until she Is able to tolerate the tube feeds.   Assessment & Plan:   Principal Problem:   Dysarthria Active Problems:   Diabetes mellitus ([place])   Hyperlipidemia   Essential hypertension   Coronary atherosclerosis   Stroke (cerebrum) (HCC)   Stage 3 chronic kidney disease (HCC)   Acute lower UTI   Failure to thrive in adult   UTI (urinary tract infection)   Dysarthria:  Probably from UTI, urine cultures grew enterococcus we will discontinue IV Rocephin change it to IV ampicillin. Enterococcus is sensitive to IV ampicillin. Change to pill form once she tolerates tube feeds.  MRI is negative for CVA.  Non focal neuro exam.     H/o Achalasia with 50 lb weight loss.  Failed treatment with botox.  IR consulted for PEG placement.  She underwent  placement of percutaneous 36 F pull-through gastrostomy tube without any complications.  Interventional radiology recommended an n.p.o. except for sips and chips remainder of today and overnight and low-volume suction until tomorrow morning and start using the tube tomorrow. Since patient had Botox injection her distal esophageal sphincter is lax and there are chances of reflux.  Will make sure she is on PPI and watch for aspiration episodes while on tube feeds.   Mild dehydration:  - gentle hydrate with IV fluids    Diabetes mellitus;  CBG (last 3)  Recent Labs    12/09/17 1629 12/09/17 2155 12/10/17 1218  GLUCAP 171* 164* 228*   Resume SSI.  No change in medications   Hyperlipidemia: Resume statin.    Hypertension Better control today changes in medication   CAD:  No chest pain or sob at this time.     STAGE 3 CKD  Creatinine improved to 1.1, baseline of 1.4   Anemia of chronic disease Hemoglobin of 10 on admission, is probably a hemo concentrated sample from dehydration.  Hemoglobin dropped to 7.7,  transfuse to keep hemoglobin greater than 7     Failure to thrive:  Nutrition consulted for recommendations. She has moderate to severe protein malnutrition.  Will start her on TPN and watch for refeeding syndrome.    Chest pain , resolved within minutes: Initial EKG with st t wave changes,  and troponin  is 0.03.  Repeat  EKG shows normal sinus rhythm. No chest pain so far.    Hypokalemia Supplementation ordered.  Hypomagnesemia and hypophosphatemia Replaced repeat in the morning.  Watch for refeeding syndrome.   DVT prophylaxis: HEPARIN SQ Code Status: full code.  Family Communication:son at bedside.  Disposition Plan: Possible discharge in 1 to 2 days after she tolerates tube feedings   Consultants:   IR    Procedures: IR consulted for PEG   Antimicrobials: ampicillin since 11/8    Subjective: No complaints from the patient no chest pain or  shortness of breath.  Objective: Vitals:   12/10/17 0910 12/10/17 0915 12/10/17 0920 12/10/17 1145  BP: (!) 172/79 (!) 158/88 (!) 182/94 (!) 153/82  Pulse: 92 92 (!) 103 99  Resp: 12 14 (!) 21 18  Temp:      TempSrc:      SpO2: 100% 100% 100% 100%  Weight:        Intake/Output Summary (Last 24 hours) at 12/10/2017 1611 Last data filed at 12/10/2017 0600 Gross per 24 hour  Intake 778.73 ml  Output 350 ml  Net 428.73 ml   Filed Weights   12/05/17 2013 12/07/17 2009  Weight: 46.7 kg 46.6 kg    Examination:  General exam: cachetic and frail looking lady, no distress noted Respiratory system: To auscultation bilaterally, no wheezing or rhonchi Cardiovascular system: S1 & S2 heard, RRR. No JVD,. No pedal edema. Gastrointestinal system: Abdomen is soft, NT ND BS+ PEG tube in place Central nervous system: Sleepy from the anesthesia from the G-tube placement Extremities: No pedal edema, cyanosis or clubbing Skin: No rashes, lesions or ulcers Psychiatry: calm, no agitation overnight.     Data Reviewed: I have personally reviewed following labs and imaging studies  CBC: Recent Labs  Lab 12/05/17 0940 12/05/17 1100 12/05/17 1201 12/06/17 0628 12/08/17 0840 12/09/17 0514  WBC 9.4  --   --  8.3 6.1 6.5  NEUTROABS 7.5  --   --   --   --   --   HGB 9.6* 10.5* 8.2* 8.5* 7.5* 7.7*  HCT 33.3* 31.0* 24.0* 28.3* 24.5* 25.8*  MCV 91.5  --   --  88.7 88.8 90.8  PLT 449*  --   --  470* 347 709   Basic Metabolic Panel: Recent Labs  Lab 12/05/17 0940  12/05/17 1201 12/06/17 0628 12/08/17 0840 12/09/17 0514 12/10/17 0358  NA 138   < > 137 137 142 139 140  K 4.5   < > 4.1 4.4 3.3* 3.8 3.5  CL 105   < > 108 109 113* 112* 113*  CO2 20*  --   --  17* 23 20* 23  GLUCOSE 100*   < > 110* 254* 206* 181* 198*  BUN 17   < > 19 16 10 10 10   CREATININE 1.53*   < > 1.30* 1.41* 1.11* 1.11* 1.13*  CALCIUM 11.1*  --   --  10.3 10.1 10.0 9.7  MG  --   --   --   --   --   --  1.5*  PHOS   --   --   --   --   --   --  2.4*   < > = values in this interval not displayed.   GFR: Estimated Creatinine Clearance: 25.3 mL/min (A) (by C-G formula based on SCr of 1.13 mg/dL (H)). Liver Function Tests: Recent Labs  Lab 12/05/17 0940 12/10/17 0358  AST 16 11*  ALT 9 7  ALKPHOS 58 44  BILITOT 0.9 0.7  PROT 8.0 6.2*  ALBUMIN 2.7* 2.0*   No results for input(s): LIPASE, AMYLASE in the last 168 hours. No results for input(s): AMMONIA in the last 168 hours. Coagulation Profile: Recent Labs  Lab 12/07/17 0625  INR 1.16   Cardiac Enzymes: Recent Labs  Lab 12/05/17 1155 12/06/17 2052  TROPONINI <0.03 0.03*   BNP (last 3 results) No results for input(s): PROBNP in the last 8760 hours. HbA1C: No results for input(s): HGBA1C in the last 72 hours. CBG: Recent Labs  Lab 12/09/17 0837 12/09/17 1155 12/09/17 1629 12/09/17 2155 12/10/17 1218  GLUCAP 157* 172* 171* 164* 228*   Lipid Profile: No results for input(s): CHOL, HDL, LDLCALC, TRIG, CHOLHDL, LDLDIRECT in the last 72 hours. Thyroid Function Tests: No results for input(s): TSH, T4TOTAL, FREET4, T3FREE, THYROIDAB in the last 72 hours. Anemia Panel: No results for input(s): VITAMINB12, FOLATE, FERRITIN, TIBC, IRON, RETICCTPCT in the last 72 hours. Sepsis Labs: Recent Labs  Lab 12/05/17 1100 12/05/17 1202  LATICACIDVEN 1.49 0.63    Recent Results (from the past 240 hour(s))  Urine Culture     Status: Abnormal   Collection Time: 12/05/17 12:40 PM  Result Value Ref Range Status   Specimen Description URINE, RANDOM  Final   Special Requests   Final    NONE Performed at [facility], [place] [address], [place], [place] [address]    Culture >=100,000 COLONIES/mL ENTEROCOCCUS FAECALIS (A)  Final   Report Status 12/09/2017 FINAL  Final   Organism ID, Bacteria ENTEROCOCCUS FAECALIS (A)  Final      Susceptibility   Enterococcus faecalis - MIC*    AMPICILLIN <=2 SENSITIVE Sensitive     LEVOFLOXACIN 1  SENSITIVE Sensitive     NITROFURANTOIN <=16 SENSITIVE Sensitive     VANCOMYCIN 1 SENSITIVE Sensitive     * >=100,000 COLONIES/mL ENTEROCOCCUS FAECALIS  MRSA PCR Screening     Status: Abnormal   Collection Time: 12/06/17  6:31 AM  Result Value Ref Range Status   MRSA by PCR POSITIVE (A) NEGATIVE Final    Comment:        The GeneXpert MRSA Assay (FDA approved for NASAL specimens only), is one component of a comprehensive MRSA colonization surveillance program. It is not intended to diagnose MRSA infection nor to guide or monitor treatment for MRSA infections. RESULT CALLED TO, READ BACK BY AND VERIFIED WITH: Marta Antu RN 10:25 12/06/17 ([email or web address]) Performed at [facility], [place] [address] [place], [place] [address]          Radiology Studies: Ir Gastrostomy Tube Mod Sed  Result Date: 12/10/2017 INDICATION: 84 year old female with achalasia and inability to eat. She presents for percutaneous gastrostomy tube placement. EXAM: Fluoroscopically guided placement of percutaneous pull-through gastrostomy tube Interventional Radiologist:  Criselda Peaches, MD MEDICATIONS: 2 g Ancef; Antibiotics were administered within 1 hour of the procedure. Additionally, 1 mg glucagon was administered prior to the procedure. ANESTHESIA/SEDATION: Versed 1.5 mg IV; Fentanyl 50 mcg IV Moderate Sedation Time:  14 minutes The patient was continuously monitored during the procedure by the interventional radiology nurse under my direct supervision. CONTRAST:  15 mL Isovue 370 injected into the gastric lumen. FLUOROSCOPY TIME:  Fluoroscopy Time: 2 minutes 48 seconds (4 mGy). COMPLICATIONS: None immediate. PROCEDURE: Informed written consent was obtained from the patient after a thorough discussion of the procedural risks, benefits and alternatives. All questions were addressed. Maximal Sterile Barrier Technique  was utilized including caps, mask, sterile gowns, sterile gloves, sterile drape, hand hygiene and  skin antiseptic. A timeout was performed prior to the initiation of the procedure. Maximal barrier sterile technique utilized including caps, mask, sterile gowns, sterile gloves, large sterile drape, hand hygiene, and chlorhexadine skin prep. An angled catheter was advanced over a wire under fluoroscopic guidance through the nose, down the esophagus and into the body of the stomach. The stomach was then insufflated with several 100 ml of air. Fluoroscopy confirmed location of the gastric bubble, as well as inferior displacement of the barium stained colon. Under direct fluoroscopic guidance, a single T-tack was placed, and the anterior gastric wall drawn up against the anterior abdominal wall. Percutaneous access was then obtained into the mid gastric body with an 18 gauge sheath needle. Aspiration of air, and injection of contrast material under fluoroscopy confirmed needle placement. An Amplatz wire was advanced in the gastric body and the access needle exchanged for a 9-French vascular sheath. A snare device was advanced through the vascular sheath and an Amplatz wire advanced through the angled catheter. The Amplatz wire was successfully snared and this was pulled up through the esophagus and out the mouth. A 20-French Alinda Dooms MIC-PEG tube was then connected to the snare and pulled through the mouth, down the esophagus, into the stomach and out to the anterior abdominal wall. Hand injection of contrast material confirmed intragastric location. The T-tack retention suture was then cut. The pull through peg tube was then secured with the external bumper and capped. The patient will be observed for several hours with the newly placed tube on low wall suction to evaluate for any post procedure complication. The patient tolerated the procedure well, there is no immediate complication. IMPRESSION: Successful placement of a 20 French pull through gastrostomy tube. Electronically Signed   By: Jacqulynn Cadet  M.D.   On: 12/10/2017 09:29   [place] Ekg Site Rite  Result Date: 12/09/2017 If Site Rite image not attached, placement could not be confirmed due to current cardiac rhythm.       Scheduled Meds: . amLODipine  10 mg Oral Daily  . atorvastatin  40 mg Oral q1800  . Chlorhexidine Gluconate Cloth  6 each Topical Q0600  . cholecalciferol  400 Units Oral Daily  . fentaNYL      . ferrous sulfate  300 mg Oral Daily  . [START ON 12/11/2017] free water  100 mL Per Tube Q8H  . glucagon (human recombinant)      . heparin injection (subcutaneous)  5,000 Units Subcutaneous Q8H  . insulin aspart  0-15 Units Subcutaneous Q4H  . isosorbide mononitrate  30 mg Oral Daily  . lidocaine      . metoprolol tartrate  2.5 mg Intravenous Q6H  . midazolam      . mirtazapine  7.5 mg Oral QHS  . multivitamin with minerals  1 tablet Oral Daily  . mupirocin ointment  1 application Nasal BID  . pantoprazole  40 mg Oral BID  . polyethylene glycol  17 g Oral Daily  . senna  1 tablet Oral QHS   Continuous Infusions: . ampicillin (OMNIPEN) IV 1 g (12/10/17 1353)  . ceFAZolin    .  ceFAZolin (ANCEF) IV 2 g (12/10/17 0905)  . dextrose 5 % and 0.9% NaCl 40 mL/hr at 12/10/17 0107  . potassium PHOSPHATE IVPB (in mmol) 20 mmol (12/10/17 1117)  . TPN ADULT (ION)       LOS: 1 day  Time spent: 35 minutes.     Hosie Poisson, MD [facility] Pager [phone]  If 7PM-7AM, please contact night-coverage [email or web address] Password [email or web address] 12/10/2017, 4:11 PM

## 2017-12-10 NOTE — Sedation Documentation (Signed)
NST

## 2017-12-10 NOTE — Progress Notes (Signed)
PHARMACY - ADULT TOTAL PARENTERAL NUTRITION CONSULT NOTE   Pharmacy Consult for TPN Indication: Prolonged poor PO intake  Patient Measurements: Weight: 102 lb 11.8 oz (46.6 kg)   Body mass index is 18.2 kg/m.  Assessment:  84 yo F presents from [facility] with stuttering and trouble getting her words out. Has been recently diagnosed with achlalasia and has had a 50 lb weight loss over the last year. Has had significant functional status decline over the past month. To have PEG tube placed but has been bumped from schedule for past 3 days. Pharmacy consulted for TPN. TPN is generally not indicated for short term use. Provider made aware but wishes to continue with starting TPN.  GI: Albumin low at 2.0. Patient is at risk of refeeding. PPI Endo: Hx of DMs. CBGs ok (150-170s) Insulin requirements in the past 24 hours: 4 units  Lytes: wnl exc K borderline at 3.5, Phos 2.4, and Mg 1.5. Renal: UOP not all charted. On D5NS at 7ml/hr Pulm: RA Cards: BP and HR ok Hepatobil: LFTs and Tbili ok. Neuro: ID: On ampicillin for UTI.  TPN Access: PICC Double Lumen TPN start date: 11/10 >> Nutritional Goals (per RD recommendation on 11/6): KCal: 1450-1650 Protein: 60-75 g Fluid: > 1.4 L  Goal TPN rate is 60 mL/hr  Initiate Jevity 1.2 @ 25 ml/hr and increase by 10 ml every 12 hours to goal rate of 55 ml/hr  Current Nutrition:  NPO for PEG placement  Plan:  Start TPN at 30 mL/hr tonight This TPN provides 37 g of protein, 115 g of dextrose, and 22 g of lipids which provides 757 kCals per day, meeting 50% of patient needs Electrolytes in TPN: standard concentration. Cl:Ac 1:2 Add MVI and trace elements to TPN Increase to moderate Q4h SSI and adjust as needed Stop D5NS tonight when TPN starts Monitor TPN labs, refeeding risk F/U placement of PEG tube and initiation of TFs  Give Mg 2g IV x 1 Give potassium phosphate 52mmol IV x 1  Bridgit Eynon J 12/10/2017,7:07 AM

## 2017-12-10 NOTE — Procedures (Signed)
Interventional Radiology Procedure Note  Procedure: Placement of percutaneous 20F pull-through gastrostomy tube. Complications: None Recommendations: - NPO except for sips and chips remainder of today and overnight - Maintain G-tube to LWS until tomorrow morning  - May advance diet as tolerated and begin using tube tomorrow morning  Signed,  Heath K. McCullough, MD   

## 2017-12-11 LAB — COMPREHENSIVE METABOLIC PANEL
ALK PHOS: 42 U/L (ref 38–126)
AST: 12 U/L — AB (ref 15–41)
Albumin: 1.6 g/dL — ABNORMAL LOW (ref 3.5–5.0)
Anion gap: 4 — ABNORMAL LOW (ref 5–15)
BUN: 10 mg/dL (ref 8–23)
CALCIUM: 9.1 mg/dL (ref 8.9–10.3)
CHLORIDE: 108 mmol/L (ref 98–111)
CO2: 24 mmol/L (ref 22–32)
CREATININE: 1.1 mg/dL — AB (ref 0.44–1.00)
GFR, EST AFRICAN AMERICAN: 50 mL/min — AB (ref >60)
GFR, EST NON AFRICAN AMERICAN: 44 mL/min — AB (ref >60)
Glucose, Bld: 212 mg/dL — ABNORMAL HIGH (ref 70–99)
Potassium: 3.4 mmol/L — ABNORMAL LOW (ref 3.5–5.1)
Sodium: 136 mmol/L (ref 135–145)
Total Bilirubin: 0.3 mg/dL (ref 0.3–1.2)
Total Protein: 5.5 g/dL — ABNORMAL LOW (ref 6.5–8.1)

## 2017-12-11 LAB — PHOSPHORUS
PHOSPHORUS: 2.5 mg/dL (ref 2.5–4.6)
Phosphorus: 2.5 mg/dL (ref 2.5–4.6)

## 2017-12-11 LAB — MAGNESIUM
Magnesium: 1.9 mg/dL (ref 1.7–2.4)
Magnesium: 2 mg/dL (ref 1.7–2.4)

## 2017-12-11 LAB — CBC
HEMATOCRIT: 23 % — AB (ref 36.0–46.0)
Hemoglobin: 7 g/dL — ABNORMAL LOW (ref 12.0–15.0)
MCH: 27.5 pg (ref 26.0–34.0)
MCHC: 30.4 g/dL (ref 30.0–36.0)
MCV: 90.2 fL (ref 80.0–100.0)
NRBC: 0 % (ref 0.0–0.2)
PLATELETS: 275 10*3/uL (ref 150–400)
RBC: 2.55 MIL/uL — ABNORMAL LOW (ref 3.87–5.11)
RDW: 17.4 % — AB (ref 11.5–15.5)
WBC: 6.3 10*3/uL (ref 4.0–10.5)

## 2017-12-11 LAB — PREPARE RBC (CROSSMATCH)

## 2017-12-11 LAB — GLUCOSE, CAPILLARY
Glucose-Capillary: 162 mg/dL — ABNORMAL HIGH (ref 70–99)
Glucose-Capillary: 149 mg/dL — ABNORMAL HIGH (ref 70–99)
Glucose-Capillary: 190 mg/dL — ABNORMAL HIGH (ref 70–99)
Glucose-Capillary: 220 mg/dL — ABNORMAL HIGH (ref 70–99)
Glucose-Capillary: 265 mg/dL — ABNORMAL HIGH (ref 70–99)

## 2017-12-11 LAB — ABO/RH: ABO/RH(D): O POS

## 2017-12-11 MED ORDER — SODIUM CHLORIDE 0.9% IV SOLUTION
Freq: Once | INTRAVENOUS | Status: AC
  Administered 2017-12-23: 07:00:00 via INTRAVENOUS

## 2017-12-11 MED ORDER — POTASSIUM CHLORIDE CRYS ER 20 MEQ PO TBCR: 40.0000 meq | EXTENDED_RELEASE_TABLET | Freq: Once | ORAL | Status: DC

## 2017-12-11 MED ORDER — CHOLECALCIFEROL 10 MCG (400 UNIT) PO TABS
400.0000 [IU] | ORAL_TABLET | Freq: Every day | ORAL | Status: DC
  Administered 2017-12-11 – 2018-02-26 (×78): 400 [IU]
  Filled 2017-12-11 (×79): qty 1

## 2017-12-11 MED ORDER — POTASSIUM CHLORIDE 20 MEQ/15ML (10%) PO SOLN
40.0000 meq | Freq: Once | ORAL | Status: AC
  Administered 2017-12-11: 40 meq
  Filled 2017-12-11: qty 30

## 2017-12-11 MED ORDER — AMLODIPINE BESYLATE 5 MG PO TABS: 10.0000 mg | ORAL_TABLET | Freq: Every day | ORAL | Status: DC

## 2017-12-11 MED ORDER — MIRTAZAPINE 15 MG PO TABS
7.5000 mg | ORAL_TABLET | Freq: Every day | ORAL | Status: DC
  Administered 2017-12-11 – 2017-12-31 (×21): 7.5 mg
  Filled 2017-12-11 (×24): qty 1

## 2017-12-11 MED ORDER — METOPROLOL TARTRATE 50 MG PO TABS
75.0000 mg | ORAL_TABLET | Freq: Two times a day (BID) | ORAL | Status: DC
  Administered 2017-12-11 – 2018-01-01 (×42): 75 mg via ORAL
  Filled 2017-12-11 (×42): qty 1

## 2017-12-11 MED ORDER — ATORVASTATIN CALCIUM 40 MG PO TABS
40.0000 mg | ORAL_TABLET | Freq: Every day | ORAL | Status: DC
  Administered 2017-12-11 – 2018-02-25 (×77): 40 mg
  Filled 2017-12-11 (×8): qty 1
  Filled 2017-12-11: qty 2
  Filled 2017-12-11 (×4): qty 1
  Filled 2017-12-11: qty 2
  Filled 2017-12-11 (×3): qty 1
  Filled 2017-12-11: qty 2
  Filled 2017-12-11 (×13): qty 1
  Filled 2017-12-11: qty 2
  Filled 2017-12-11 (×7): qty 1
  Filled 2017-12-11: qty 2
  Filled 2017-12-11 (×2): qty 1
  Filled 2017-12-11: qty 4
  Filled 2017-12-11: qty 1
  Filled 2017-12-11: qty 2
  Filled 2017-12-11 (×3): qty 1
  Filled 2017-12-11: qty 2
  Filled 2017-12-11 (×2): qty 1
  Filled 2017-12-11: qty 2
  Filled 2017-12-11 (×18): qty 1
  Filled 2017-12-11: qty 2
  Filled 2017-12-11 (×3): qty 1
  Filled 2017-12-11: qty 2
  Filled 2017-12-11 (×3): qty 1

## 2017-12-11 MED ORDER — ADULT MULTIVITAMIN LIQUID CH
15.0000 mL | Freq: Every day | ORAL | Status: DC
  Administered 2017-12-12 – 2018-02-26 (×77): 15 mL
  Filled 2017-12-11 (×78): qty 15

## 2017-12-11 MED ORDER — JEVITY 1.2 CAL PO LIQD
1000.0000 mL | ORAL | Status: DC
  Administered 2017-12-11: 25 mL/h
  Administered 2017-12-12 – 2017-12-19 (×8): 1000 mL
  Filled 2017-12-11 (×16): qty 1000

## 2017-12-11 MED ORDER — TRAVASOL 10 % IV SOLN
INTRAVENOUS | Status: AC
  Administered 2017-12-11: 18:00:00 via INTRAVENOUS
  Filled 2017-12-11: qty 374.4

## 2017-12-11 MED ORDER — FERROUS SULFATE 300 (60 FE) MG/5ML PO SYRP
300.0000 mg | ORAL_SOLUTION | Freq: Every day | ORAL | Status: DC
  Administered 2017-12-11 – 2018-02-26 (×78): 300 mg
  Filled 2017-12-11 (×78): qty 5

## 2017-12-11 MED ORDER — VITAL HIGH PROTEIN PO LIQD: 1000.0000 mL | ORAL | Status: DC

## 2017-12-11 MED ORDER — POLYETHYLENE GLYCOL 3350 17 G PO PACK
17.0000 g | PACK | Freq: Every day | ORAL | Status: DC
  Administered 2017-12-11 – 2017-12-16 (×6): 17 g
  Filled 2017-12-11 (×5): qty 1

## 2017-12-11 MED ORDER — MEGESTROL ACETATE 40 MG PO TABS
40.0000 mg | ORAL_TABLET | Freq: Every day | ORAL | Status: DC
  Administered 2017-12-11 – 2017-12-12 (×2): 40 mg via ORAL
  Filled 2017-12-11 (×2): qty 1

## 2017-12-11 MED ORDER — PANTOPRAZOLE SODIUM 40 MG PO PACK
40.0000 mg | PACK | Freq: Two times a day (BID) | ORAL | Status: DC
  Administered 2017-12-11 – 2018-01-01 (×43): 40 mg
  Filled 2017-12-11 (×50): qty 20

## 2017-12-11 MED ORDER — SENNOSIDES 8.8 MG/5ML PO SYRP
5.0000 mL | ORAL_SOLUTION | Freq: Every day | ORAL | Status: DC
  Administered 2017-12-11 – 2018-01-17 (×29): 5 mL
  Filled 2017-12-11 (×36): qty 5

## 2017-12-11 NOTE — Progress Notes (Signed)
[name] state that his mother should not be on Heparin or any anticoagulant because it drops her hemoglobin level and cause her to bleed.

## 2017-12-11 NOTE — Progress Notes (Signed)
Physical Therapy Treatment Patient Details Name: [name] MRN: [id number] DOB: Dec 16, 1927 Today's Date: 12/11/2017    History of Present Illness 84 yo female with onset of UTI and AMS was admitted with ongoing loss of wgt and dehydration resulting from achalasia with poor result from botox in visit from 11/20/17.  Pt has FTT, has lost 50 pounds in last year.  Intake of mainly milk and is expecting to have g tube per son to restore function.  PMHx:  CAD, AKI, angina, cardiomegaly, CABG, CVA with L hemi, CKD 3, transfusion, pleural effusion, DM, achalasia with botox procedure. G-tube placement 12/10/17.     PT Comments    Pt lethargic with minimal participation this session. Focus of session ended up being bed mobility for peri-care and linen change as she was soiled. Pt minimally engaging muscles to raise UE's off bed and minimal volitional movement noted in LE's. Pt repositioned with prevalon boots in place and sacrum off-weighted at end of session. Feel an air mattress would be beneficial due to decreased skin integrity. Son present and somewhat limiting at times - reports that he does not feel she is strong enough to be out of bed, however wants to know what the therapy plan is to get her back to baseline. Educated on plan for acute physical therapy and benefits of OOB mobility. Will continue to follow.    Follow Up Recommendations  SNF     Equipment Recommendations  None recommended by PT    Recommendations for Other Services       Precautions / Restrictions Precautions Precautions: Fall Precaution Comments: coccygeal wound Restrictions Weight Bearing Restrictions: No    Mobility  Bed Mobility Overal bed mobility: Needs Assistance Bed Mobility: Rolling Rolling: Total assist         General bed mobility comments: Rolling for peri-care and bed pad change. Pt requires total assist to roll and maintain sidelying position while second person cleaned patient and managed linen.    Transfers                 General transfer comment: Unable to progress.   Ambulation/Gait                 Stairs             Wheelchair Mobility    Modified Rankin (Stroke Patients Only)       Balance                                            Cognition Arousal/Alertness: Lethargic Behavior During Therapy: Flat affect Overall Cognitive Status: Difficult to assess Area of Impairment: Attention;Following commands;Safety/judgement;Awareness                   Current Attention Level: Sustained   Following Commands: Follows one step commands inconsistently;Follows one step commands with increased time   Awareness: Intellectual Problem Solving: Slow processing;Decreased initiation;Difficulty sequencing;Requires verbal cues        Exercises      General Comments        Pertinent Vitals/Pain Pain Assessment: Faces Faces Pain Scale: Hurts little more Pain Location: General grimacing and moaning with movement Pain Descriptors / Indicators: Grimacing;Discomfort;Moaning Pain Intervention(s): Limited activity within patient's tolerance;Monitored during session;Repositioned    Home Living  Prior Function            PT Goals (current goals can now be found in the care plan section) Acute Rehab PT Goals Patient Stated Goal: "for her to get stronger and regain her life" per son PT Goal Formulation: With family Time For Goal Achievement: 12/20/17 Potential to Achieve Goals: Fair Progress towards PT goals: Progressing toward goals    Frequency    Min 2X/week      PT Plan Current plan remains appropriate    Co-evaluation              AM-PAC PT "6 Clicks" Daily Activity  Outcome Measure  Difficulty turning over in bed (including adjusting bedclothes, sheets and blankets)?: Unable Difficulty moving from lying on back to sitting on the side of the bed? : Unable Difficulty  sitting down on and standing up from a chair with arms (e.g., wheelchair, bedside commode, etc,.)?: Unable Help needed moving to and from a bed to chair (including a wheelchair)?: Total Help needed walking in hospital room?: Total Help needed climbing 3-5 steps with a railing? : Total 6 Click Score: 6    End of Session   Activity Tolerance: Patient limited by lethargy;Patient limited by fatigue Patient left: in bed;with call bell/phone within reach;with family/visitor present Nurse Communication: Mobility status PT Visit Diagnosis: Muscle weakness (generalized) (M62.81);Other abnormalities of gait and mobility (R26.89);Adult, failure to thrive (R62.7);Hemiplegia and hemiparesis     Time: 6773-7366 PT Time Calculation (min) (ACUTE ONLY): 22 min  Charges:  $Therapeutic Activity: 8-22 mins                     Rolinda Roan, PT, DPT Acute Rehabilitation Services Pager: [phone] Office: [phone]    Thelma Comp 12/11/2017, 11:09 AM

## 2017-12-11 NOTE — Progress Notes (Signed)
Nutrition Follow-up  DOCUMENTATION CODES:   Non-severe (moderate) malnutrition in context of chronic illness, Underweight  INTERVENTION:   Tube Feeding:  Jevity 1.2 @ 55 ml/hr Being Jevity 1.2 @ 25 ml/hr; if tolerating, titrate by 10 mL q 8 hours until goal rate of 55 ml/hr  Monitor magnesium, potassium, and phosphorus daily for at least 3 days, MD to replete as needed, as pt is at risk for refeeding syndrome given malnuourished on admission, prolonged inadequate oral intake.  Goal would be to transition pt to bolus feedings as tolerated; goal bolus feeding:  Jevity 1.5 1 can (237 mL) 4 times daily Provides 1420 kcals, 60 g of protein  TPN per Pharmacy; recommend continuing TPN until TF initiated and pt demonstrating tolerance and able to begin titration   NUTRITION DIAGNOSIS:   Moderate Malnutrition related to chronic illness(dysphagia 2/2 to achalasia) as evidenced by energy intake < or equal to 75% for > or equal to 1 month, mild fat depletion, moderate fat depletion, mild muscle depletion, moderate muscle depletion, energy intake < or equal to 50% for > or equal to 1 month.  Being addressed via TPN, G-tube placed for TF  GOAL:   Patient will meet greater than or equal to 90% of their needs  Met  MONITOR:   PO intake, Supplement acceptance, Labs, Weight trends, Skin, I & O's  REASON FOR ASSESSMENT:   Consult Assessment of nutrition requirement/status, Poor PO  ASSESSMENT:   [name] is a 84 y.o. female with medical history significant for DM2, HTN, HLD, CAD s/p CABG, h/o CVA, CKD3, who was recently diagnosed with achalasia after a 50 lb weight loss over the past year. She was treated with botox earlier in October and sent to SNF with the hopes she would be able to swallow her food. She has not done well since that time, has only been able to drink small amounts of milk. Son is a Management consultant in [place] and has been visiting. Yesterday he noticed she was  stuttering, having difficulty getting words out, and progressed until she seemed to have total aphasia last night. She does report having dysuria and some lower abd pain. No N/V. Has a good appetite but avoids food because of the obstruction. Megace and remeron have not helped. Process of being worked up for G tube has been initiated and pt reportedly has appt with [facility] tomorrow morning. Son feels like her weight loss and FTT could be reversible if she gets a G tube as she was reportedly very functional a month ago, walking with a walker and performing all of her ADLs.   11/10 TPN started, IR placed G-tube  NPO G-tube ok for use beginning today  Labs: CBGs 124-228, potassium 3.5 (wdl), phosphorus 2.4 (L), magnesium 1.5 (L), Creatinine 1.13, BUN wdl Meds: MVI liquid, remeron, megace, ss novolog, cholecalciferol   Diet Order:   Diet Order    None      EDUCATION NEEDS:   Education needs have been addressed  Skin:  Skin Integrity Issues:: Stage II, Other (Comment) Stage II: coccyx Other: partial thickness wounds to bilateral buttocks (likely from adhesive)  Last BM:  11/10  Height:   Ht Readings from Last 1 Encounters:  11/20/17 _0  (1.6 m)    Weight:   Wt Readings from Last 1 Encounters:  12/07/17 46.6 kg    Ideal Body Weight:  52.3 kg  BMI:  Body mass index is 18.2 kg/m.  Estimated Nutritional Needs:   Kcal:  1962-2297  Protein:  60-75 grams  Fluid:  > 1.4 L   Kerman Passey MS, RD, LDN, CNSC [phone] Pager  [phone] Weekend/On-Call Pager

## 2017-12-11 NOTE — Progress Notes (Signed)
Referring Physician(s): Dr Karleen Hampshire  Supervising Physician: Daryll Brod  Patient Status:  [facility] - In-pt  Chief Complaint:  Percutaneous gastric tube placed in IR 11/10  Subjective:  G tube in place Afeb   Allergies: Contrast media [iodinated diagnostic agents]  Medications: Prior to Admission medications   Medication Sig Start Date End Date Taking? Authorizing Provider  acetaminophen (TYLENOL) 500 MG tablet Take 500 mg by mouth every 6 (six) hours as needed for headache.    Yes [provider]  amLODipine (NORVASC) 10 MG tablet TAKE ONE TABLET BY MOUTH ONCE DAILY. Patient taking differently: Take 10 mg by mouth daily.  07/27/17  Yes Minus Breeding, MD  atorvastatin (LIPITOR) 40 MG tablet Take 1 tablet (40 mg total) by mouth daily. Patient taking differently: Take 40 mg by mouth daily at 6 PM.  04/25/17  Yes Garvin Fila, MD  cholecalciferol (VITAMIN D) 400 units TABS tablet Take 1 tablet (400 Units total) by mouth daily. 10/06/16  Yes Angiulli, Lavon Paganini, PA-C  ferrous sulfate 220 (44 Fe) MG/5ML solution Take 10 mLs by mouth daily. 10/25/17  Yes [provider]  glipiZIDE (GLUCOTROL) 5 MG tablet Take 2 tablets (10 mg total) by mouth daily before breakfast. 11/24/17  Yes Nita Sells, MD  isosorbide mononitrate (IMDUR) 30 MG 24 hr tablet TAKE 1 TABLET BY MOUTH ONCE DAILY Patient taking differently: Take 30 mg by mouth daily.  09/22/17  Yes Minus Breeding, MD  megestrol (MEGACE) 20 MG tablet Take 20 mg by mouth at bedtime.   Yes [provider]  metoprolol tartrate 75 MG TABS Take 75 mg by mouth 2 (two) times daily. 11/24/17  Yes Nita Sells, MD  mirtazapine (REMERON) 15 MG tablet Take 7.5 mg by mouth at bedtime.   Yes [provider]  nitroGLYCERIN (NITROSTAT) 0.4 MG SL tablet Place 1 tablet (0.4 mg total) under the tongue every 5 (five) minutes x 3 doses as needed for chest pain. 10/08/16  Yes Rosita Fire, Brittainy M, PA-C    pantoprazole (PROTONIX) 40 MG tablet Take 1 tablet (40 mg total) by mouth 2 (two) times daily. 11/24/17  Yes Nita Sells, MD  polyethylene glycol (MIRALAX / GLYCOLAX) packet Take 17 g by mouth daily. 10/16/16  Yes Regalado, Belkys A, MD  senna (SENOKOT) 8.6 MG TABS tablet Take 1 tablet (8.6 mg total) by mouth daily. Patient taking differently: Take 1 tablet by mouth at bedtime.  10/16/16  Yes Regalado, Belkys A, MD  feeding supplement, ENSURE ENLIVE, (ENSURE ENLIVE) LIQD Take 237 mLs by mouth 2 (two) times daily between meals. Patient not taking: Reported on 12/05/2017 11/24/17   Nita Sells, MD  megestrol (MEGACE) 400 MG/10ML suspension Take 10 mLs (400 mg total) by mouth daily. Patient not taking: Reported on 12/05/2017 11/24/17   Nita Sells, MD  protein supplement shake (PREMIER PROTEIN) LIQD Take 325 mLs (11 oz total) by mouth daily. Patient not taking: Reported on 11/04/2017 10/15/16   Niel Hummer A, MD     Vital Signs: BP (!) 145/73 (BP Location: Left Arm)   Pulse 87   Temp 98.3 F (36.8 C) (Oral)   Resp 16   Wt 102 lb 11.8 oz (46.6 kg)   SpO2 100%   BMI 18.20 kg/m   Physical Exam  Abdominal: Soft. Bowel sounds are normal.  Skin: Skin is warm and dry.  Site is clean and dry NT no bleeding +BS     Imaging: Ir Gastrostomy Tube Mod Sed  Result Date:  12/10/2017 INDICATION: 84 year old female with achalasia and inability to eat. She presents for percutaneous gastrostomy tube placement. EXAM: Fluoroscopically guided placement of percutaneous pull-through gastrostomy tube Interventional Radiologist:  Criselda Peaches, MD MEDICATIONS: 2 g Ancef; Antibiotics were administered within 1 hour of the procedure. Additionally, 1 mg glucagon was administered prior to the procedure. ANESTHESIA/SEDATION: Versed 1.5 mg IV; Fentanyl 50 mcg IV Moderate Sedation Time:  14 minutes The patient was continuously monitored during the procedure by the interventional  radiology nurse under my direct supervision. CONTRAST:  15 mL Isovue 370 injected into the gastric lumen. FLUOROSCOPY TIME:  Fluoroscopy Time: 2 minutes 48 seconds (4 mGy). COMPLICATIONS: None immediate. PROCEDURE: Informed written consent was obtained from the patient after a thorough discussion of the procedural risks, benefits and alternatives. All questions were addressed. Maximal Sterile Barrier Technique was utilized including caps, mask, sterile gowns, sterile gloves, sterile drape, hand hygiene and skin antiseptic. A timeout was performed prior to the initiation of the procedure. Maximal barrier sterile technique utilized including caps, mask, sterile gowns, sterile gloves, large sterile drape, hand hygiene, and chlorhexadine skin prep. An angled catheter was advanced over a wire under fluoroscopic guidance through the nose, down the esophagus and into the body of the stomach. The stomach was then insufflated with several 100 ml of air. Fluoroscopy confirmed location of the gastric bubble, as well as inferior displacement of the barium stained colon. Under direct fluoroscopic guidance, a single T-tack was placed, and the anterior gastric wall drawn up against the anterior abdominal wall. Percutaneous access was then obtained into the mid gastric body with an 18 gauge sheath needle. Aspiration of air, and injection of contrast material under fluoroscopy confirmed needle placement. An Amplatz wire was advanced in the gastric body and the access needle exchanged for a 9-French vascular sheath. A snare device was advanced through the vascular sheath and an Amplatz wire advanced through the angled catheter. The Amplatz wire was successfully snared and this was pulled up through the esophagus and out the mouth. A 20-French Alinda Dooms MIC-PEG tube was then connected to the snare and pulled through the mouth, down the esophagus, into the stomach and out to the anterior abdominal wall. Hand injection of contrast  material confirmed intragastric location. The T-tack retention suture was then cut. The pull through peg tube was then secured with the external bumper and capped. The patient will be observed for several hours with the newly placed tube on low wall suction to evaluate for any post procedure complication. The patient tolerated the procedure well, there is no immediate complication. IMPRESSION: Successful placement of a 20 French pull through gastrostomy tube. Electronically Signed   By: Jacqulynn Cadet M.D.   On: 12/10/2017 09:29   [place] Ekg Site Rite  Result Date: 12/09/2017 If Site Rite image not attached, placement could not be confirmed due to current cardiac rhythm.   Labs:  CBC: Recent Labs    12/05/17 0940  12/05/17 1201 12/06/17 0628 12/08/17 0840 12/09/17 0514  WBC 9.4  --   --  8.3 6.1 6.5  HGB 9.6*   < > 8.2* 8.5* 7.5* 7.7*  HCT 33.3*   < > 24.0* 28.3* 24.5* 25.8*  PLT 449*  --   --  470* 347 278   < > = values in this interval not displayed.    COAGS: Recent Labs    02/09/17 2011 11/08/17 1643 12/07/17 0625  INR 0.92 1.02 1.16    BMP: Recent Labs  12/06/17 0628 12/08/17 0840 12/09/17 0514 12/10/17 0358  NA 137 142 139 140  K 4.4 3.3* 3.8 3.5  CL 109 113* 112* 113*  CO2 17* 23 20* 23  GLUCOSE 254* 206* 181* 198*  BUN 16 10 10 10   CALCIUM 10.3 10.1 10.0 9.7  CREATININE 1.41* 1.11* 1.11* 1.13*  GFRNONAA 32* 43* 43* 42*  GFRAA 37* 50* 50* 49*    LIVER FUNCTION TESTS: Recent Labs    11/04/17 1110 11/05/17 0514 11/16/17 0606 12/05/17 0940 12/10/17 0358  BILITOT 0.9 1.1  --  0.9 0.7  AST 17 14*  --  16 11*  ALT 10 9  --  9 7  ALKPHOS 70 59  --  58 44  PROT 8.0 6.8  --  8.0 6.2*  ALBUMIN 2.9* 2.6* 2.1* 2.7* 2.0*    Assessment and Plan:  May use G tube now  Electronically Signed: Jaylyne Breese A, PA-C 12/11/2017, 7:50 AM   I spent a total of 15 Minutes at the the patient's bedside AND on the patient's hospital floor or unit, greater  than 50% of which was counseling/coordinating care for G tube placement

## 2017-12-11 NOTE — Progress Notes (Signed)
PHARMACY - ADULT TOTAL PARENTERAL NUTRITION CONSULT NOTE   Pharmacy Consult for TPN Indication: Prolonged poor PO intake  Patient Measurements: Weight: 102 lb 11.8 oz (46.6 kg)   Body mass index is 18.2 kg/m.  Assessment:  84 yo F presents from [facility] with stuttering and trouble getting her words out. Has been recently diagnosed with achlalasia and has had a 50 lb weight loss over the last year. Has had significant functional status decline over the past month. To have PEG tube placed but has been bumped from schedule for past 3 days. Pharmacy consulted for TPN. TPN is generally not indicated for short term use. Provider made aware but wishes to continue with starting TPN.  GI: Albumin low at 2.0. No prealbumin. Patient is at risk of refeeding. PPI po BID - Supple: Vit D 400/d, FESO4 300/d, Free water 100/8, MV po, Miralax, Senna/hs>>change meds to per tube - G tube placed 11/10  Endo: Hx of DMs. CBGs ok (124-228) Insulin requirements in the past 24 hours: 21 units   Lytes: wnl exc K borderline at 3.5, Phos 2.4, and Mg 1.5. Renal: Scr 1.1 stable. IVF stopped. Pulm: RA Cards: Norvasc10, Lipitor40, Imdur30, metoprolol IV Hepatobil: LFTs and Tbili ok. Neuro: Remeron ID: On ampicillin for UTI.  TPN Access: PICC Double Lumen TPN start date: 11/10 >> G tube: 11/10 Nutritional Goals (per RD recommendation on 11/6): KCal: 1450-1650 Protein: 60-75 g Fluid: > 1.4 L  Goal TPN rate is 60 mL/hr  Initiate Jevity 1.2 @ 25 ml/hr and increase by 10 ml every 12 hours to goal rate of 55 ml/hr  Current Nutrition:  NPO for PEG placement  Plan:  ContinueTPN at 30 mL/hr tonight-will not increase due to plans to start TF. IR ok to use G-tube - This TPN provides 37 g of protein, 115 g of dextrose, and 22 g of lipids which provides 757 kCals per day, meeting 50% of patient needs Electrolytes in TPN: standard concentration. Cl:Ac 1:2 Add MVI and trace elements to TPN Increase to moderate  Q4h SSI and adjust as needed Change po meds>>per tube Check prealbumin and electrolytes in AM. F/u tube feed orders today Supplement insulin outside of TPN since this is expected to stop.  [name], PharmD, [facility] Clinical Staff Pharmacist [phone] [name] 12/11/2017,9:41 AM

## 2017-12-11 NOTE — Progress Notes (Signed)
PROGRESS NOTE    [name]  YHC:[id number] DOB: [birth year] DOA: 12/05/2017 PCP: Deland Pretty, MD    Brief Narrative:  [name] is a 84 y.o. female with medical history significant for DM2, HTN, HLD, CAD s/p CABG, h/o CVA, CKD3, who was recently diagnosed with achalasia after a 50 lb weight loss over the past year. She was treated with botox earlier in October and sent to SNF with the hopes she would be able to swallow her food. She has not done well since that time, has only been able to drink small amounts of milk. She was noticed to be stuttering, having difficulty with articulation. And was sent to [facility] for evaluation of stroke. MRi brain was negative for CVA.  Meanwhile her oral intake has been very low and she is losing weight, and her son at bedside requesting PEG Placement. IR consulted.  Currently awaiting PEG placement by IR, but IR feels she has a large hiatal hernia which might impede the placement and unfortunately, she has been on the schedule for the last three days and unable to get it due to scheduling reasons. Since she is declining rapidly and has minimal to no oral intake , we will go ahead and start TPN. She underwent PEG placement yesterday.  Continues TPN until she Is able to tolerate the tube feeds.   Assessment & Plan:   Principal Problem:   Dysarthria Active Problems:   Diabetes mellitus ([place])   Hyperlipidemia   Essential hypertension   Coronary atherosclerosis   Stroke (cerebrum) (HCC)   Stage 3 chronic kidney disease (HCC)   Acute lower UTI   Failure to thrive in adult   UTI (urinary tract infection)   Dysarthria:  Probably from UTI, urine cultures grew enterococcus we will discontinue IV Rocephin change it to IV ampicillin. Enterococcus is sensitive to IV ampicillin. Change to Amoxicillin via  Peg tube.    MRI is negative for CVA.  Non focal neuro exam.  Pt very lethargic and minimally participative with PT.      H/o Achalasia with 50 lb weight  loss.  Failed treatment with botox.  IR consulted for PEG placement.  She underwent placement of percutaneous 80 F pull-through gastrostomy tube without any complications.  Since patient had Botox injection her distal esophageal sphincter is lax and there are chances of reflux.  Will make sure she is on PPI and watch for aspiration episodes while on tube feeds. Started on TUBE feeds at 25ml/hr and advance every 36ml every 12 hours.    Mild dehydration:  - gentle hydrate with IV fluids    Diabetes mellitus;  CBG (last 3)  Recent Labs    12/11/17 0449 12/11/17 0757 12/11/17 1143  GLUCAP 162* 149* 190*   Resume SSI.  No change in medications   Hyperlipidemia: Resume statin.    Hypertension Currently on imdur, metoprolol 75 mg BID.    CAD:  No chest pain or sob at this time.     STAGE 3 CKD  Creatinine improved to 1.1, baseline of 1.4   Anemia of chronic disease Hemoglobin of 10 on admission, is probably a hemo concentrated sample from dehydration.  Hemoglobin dropped to 7  , I UNIT PRBC transfusion ordered.    Failure to thrive:  Nutrition consulted for recommendations. She has moderate to severe protein malnutrition.  Started her on TPN and watch for refeeding syndrome.    Chest pain , resolved within minutes: Initial EKG with st t wave changes,  and troponin is 0.03.  Repeat  EKG shows normal sinus rhythm. No chest pain so far.    Hypokalemia Supplementation ordered.  Hypomagnesemia and hypophosphatemia Replaced repeat in the morning show much improvement. .  Watch for refeeding syndrome.   DVT prophylaxis: HEPARIN SQ Code Status: full code.  Family Communication:son at bedside.  Disposition Plan: Possible discharge in 1 to 2 days after she tolerates tube feedings   Consultants:   IR    Procedures: IR consulted for PEG   Antimicrobials: ampicillin since 11/8    Subjective: Lethargic, not able t o participate in PT.  Not holding a  conversation.   Objective: Vitals:   12/10/17 0920 12/10/17 1145 12/10/17 2005 12/11/17 0928  BP: (!) 182/94 (!) 153/82 (!) 145/73 (!) 161/81  Pulse: (!) 103 99 87 90  Resp: (!) 21 18 16 19   Temp:   98.3 F (36.8 C) 97.8 F (36.6 C)  TempSrc:   Oral Oral  SpO2: 100% 100% 100% 100%  Weight:        Intake/Output Summary (Last 24 hours) at 12/11/2017 1604 Last data filed at 12/11/2017 1506 Gross per 24 hour  Intake 2067.68 ml  Output 0 ml  Net 2067.68 ml   Filed Weights   12/05/17 2013 12/07/17 2009  Weight: 46.7 kg 46.6 kg    Examination:  General exam: cachetic and frail looking lady, no distress noted Respiratory system: To auscultation bilaterally, no wheezing or rhonchi Cardiovascular system: S1 & S2 heard, RRR. No JVD,. No pedal edema. Gastrointestinal system: Abdomen is soft, NT ND BS+ PEG tube in place, sutures in place.  Central nervous system: lethargic, opens eyes briefly to calling her name, not participating in PT.  Extremities: bilateral lower extremities in boots.  Skin: No rashes, lesions or ulcers Psychiatry: calm, no agitation overnight.     Data Reviewed: I have personally reviewed following labs and imaging studies  CBC: Recent Labs  Lab 12/05/17 0940  12/05/17 1201 12/06/17 0628 12/08/17 0840 12/09/17 0514 12/11/17 1148  WBC 9.4  --   --  8.3 6.1 6.5 6.3  NEUTROABS 7.5  --   --   --   --   --   --   HGB 9.6*   < > 8.2* 8.5* 7.5* 7.7* 7.0*  HCT 33.3*   < > 24.0* 28.3* 24.5* 25.8* 23.0*  MCV 91.5  --   --  88.7 88.8 90.8 90.2  PLT 449*  --   --  470* 347 278 275   < > = values in this interval not displayed.   Basic Metabolic Panel: Recent Labs  Lab 12/06/17 0628 12/08/17 0840 12/09/17 0514 12/10/17 0358 12/11/17 1148  NA 137 142 139 140 136  K 4.4 3.3* 3.8 3.5 3.4*  CL 109 113* 112* 113* 108  CO2 17* 23 20* 23 24  GLUCOSE 254* 206* 181* 198* 212*  BUN 16 10 10 10 10   CREATININE 1.41* 1.11* 1.11* 1.13* 1.10*  CALCIUM 10.3 10.1  10.0 9.7 9.1  MG  --   --   --  1.5* 1.9  PHOS  --   --   --  2.4* 2.5   GFR: Estimated Creatinine Clearance: 26 mL/min (A) (by C-G formula based on SCr of 1.1 mg/dL (H)). Liver Function Tests: Recent Labs  Lab 12/05/17 0940 12/10/17 0358 12/11/17 1148  AST 16 11* 12*  ALT 9 7 <5  ALKPHOS 58 44 42  BILITOT 0.9 0.7 0.3  PROT 8.0 6.2* 5.5*  ALBUMIN 2.7* 2.0* 1.6*   No results for input(s): LIPASE, AMYLASE in the last 168 hours. No results for input(s): AMMONIA in the last 168 hours. Coagulation Profile: Recent Labs  Lab 12/07/17 0625  INR 1.16   Cardiac Enzymes: Recent Labs  Lab 12/05/17 1155 12/06/17 2052  TROPONINI <0.03 0.03*   BNP (last 3 results) No results for input(s): PROBNP in the last 8760 hours. HbA1C: No results for input(s): HGBA1C in the last 72 hours. CBG: Recent Labs  Lab 12/10/17 2008 12/10/17 2358 12/11/17 0449 12/11/17 0757 12/11/17 1143  GLUCAP 159* 124* 162* 149* 190*   Lipid Profile: No results for input(s): CHOL, HDL, LDLCALC, TRIG, CHOLHDL, LDLDIRECT in the last 72 hours. Thyroid Function Tests: No results for input(s): TSH, T4TOTAL, FREET4, T3FREE, THYROIDAB in the last 72 hours. Anemia Panel: No results for input(s): VITAMINB12, FOLATE, FERRITIN, TIBC, IRON, RETICCTPCT in the last 72 hours. Sepsis Labs: Recent Labs  Lab 12/05/17 1100 12/05/17 1202  LATICACIDVEN 1.49 0.63    Recent Results (from the past 240 hour(s))  Urine Culture     Status: Abnormal   Collection Time: 12/05/17 12:40 PM  Result Value Ref Range Status   Specimen Description URINE, RANDOM  Final   Special Requests   Final    NONE Performed at [facility], [place] [address], [place], [place] [address]    Culture >=100,000 COLONIES/mL ENTEROCOCCUS FAECALIS (A)  Final   Report Status 12/09/2017 FINAL  Final   Organism ID, Bacteria ENTEROCOCCUS FAECALIS (A)  Final      Susceptibility   Enterococcus faecalis - MIC*    AMPICILLIN <=2 SENSITIVE Sensitive      LEVOFLOXACIN 1 SENSITIVE Sensitive     NITROFURANTOIN <=16 SENSITIVE Sensitive     VANCOMYCIN 1 SENSITIVE Sensitive     * >=100,000 COLONIES/mL ENTEROCOCCUS FAECALIS  MRSA PCR Screening     Status: Abnormal   Collection Time: 12/06/17  6:31 AM  Result Value Ref Range Status   MRSA by PCR POSITIVE (A) NEGATIVE Final    Comment:        The GeneXpert MRSA Assay (FDA approved for NASAL specimens only), is one component of a comprehensive MRSA colonization surveillance program. It is not intended to diagnose MRSA infection nor to guide or monitor treatment for MRSA infections. RESULT CALLED TO, READ BACK BY AND VERIFIED WITH: Marta Antu RN 10:25 12/06/17 ([email or web address]) Performed at [facility], [place] [address] [place], [place] [address]          Radiology Studies: Ir Gastrostomy Tube Mod Sed  Result Date: 12/10/2017 INDICATION: 84 year old female with achalasia and inability to eat. She presents for percutaneous gastrostomy tube placement. EXAM: Fluoroscopically guided placement of percutaneous pull-through gastrostomy tube Interventional Radiologist:  Criselda Peaches, MD MEDICATIONS: 2 g Ancef; Antibiotics were administered within 1 hour of the procedure. Additionally, 1 mg glucagon was administered prior to the procedure. ANESTHESIA/SEDATION: Versed 1.5 mg IV; Fentanyl 50 mcg IV Moderate Sedation Time:  14 minutes The patient was continuously monitored during the procedure by the interventional radiology nurse under my direct supervision. CONTRAST:  15 mL Isovue 370 injected into the gastric lumen. FLUOROSCOPY TIME:  Fluoroscopy Time: 2 minutes 48 seconds (4 mGy). COMPLICATIONS: None immediate. PROCEDURE: Informed written consent was obtained from the patient after a thorough discussion of the procedural risks, benefits and alternatives. All questions were addressed. Maximal Sterile Barrier Technique was utilized including caps, mask, sterile gowns, sterile gloves, sterile  drape, hand hygiene and  skin antiseptic. A timeout was performed prior to the initiation of the procedure. Maximal barrier sterile technique utilized including caps, mask, sterile gowns, sterile gloves, large sterile drape, hand hygiene, and chlorhexadine skin prep. An angled catheter was advanced over a wire under fluoroscopic guidance through the nose, down the esophagus and into the body of the stomach. The stomach was then insufflated with several 100 ml of air. Fluoroscopy confirmed location of the gastric bubble, as well as inferior displacement of the barium stained colon. Under direct fluoroscopic guidance, a single T-tack was placed, and the anterior gastric wall drawn up against the anterior abdominal wall. Percutaneous access was then obtained into the mid gastric body with an 18 gauge sheath needle. Aspiration of air, and injection of contrast material under fluoroscopy confirmed needle placement. An Amplatz wire was advanced in the gastric body and the access needle exchanged for a 9-French vascular sheath. A snare device was advanced through the vascular sheath and an Amplatz wire advanced through the angled catheter. The Amplatz wire was successfully snared and this was pulled up through the esophagus and out the mouth. A 20-French Alinda Dooms MIC-PEG tube was then connected to the snare and pulled through the mouth, down the esophagus, into the stomach and out to the anterior abdominal wall. Hand injection of contrast material confirmed intragastric location. The T-tack retention suture was then cut. The pull through peg tube was then secured with the external bumper and capped. The patient will be observed for several hours with the newly placed tube on low wall suction to evaluate for any post procedure complication. The patient tolerated the procedure well, there is no immediate complication. IMPRESSION: Successful placement of a 20 French pull through gastrostomy tube. Electronically Signed    By: Jacqulynn Cadet M.D.   On: 12/10/2017 09:29        Scheduled Meds: . amLODipine  10 mg Per Tube Daily  . atorvastatin  40 mg Per Tube q1800  . cholecalciferol  400 Units Per Tube Daily  . ferrous sulfate  300 mg Per Tube Daily  . free water  100 mL Per Tube Q8H  . heparin injection (subcutaneous)  5,000 Units Subcutaneous Q8H  . insulin aspart  0-15 Units Subcutaneous Q4H  . isosorbide mononitrate  30 mg Oral Daily  . megestrol  40 mg Oral Daily  . metoprolol tartrate  75 mg Oral BID  . mirtazapine  7.5 mg Per Tube QHS  . [START ON 12/12/2017] multivitamin  15 mL Per Tube Daily  . mupirocin ointment  1 application Nasal BID  . pantoprazole sodium  40 mg Per Tube BID  . polyethylene glycol  17 g Per Tube Daily  . potassium chloride  40 mEq Oral Once  . sennosides  5 mL Per Tube QHS   Continuous Infusions: . ampicillin (OMNIPEN) IV 1 g (12/11/17 1303)  .  ceFAZolin (ANCEF) IV 2 g (12/10/17 0905)  . feeding supplement (JEVITY 1.2 CAL) 25 mL/hr (12/11/17 1520)  . TPN ADULT (ION) 30 mL/hr at 12/10/17 1750  . TPN ADULT (ION)       LOS: 2 days    Time spent: 30 minutes.     Hosie Poisson, MD [facility] Pager [phone]  If 7PM-7AM, please contact night-coverage [email or web address] Password [email or web address] 12/11/2017, 4:04 PM

## 2017-12-12 LAB — PHOSPHORUS
PHOSPHORUS: 4.4 mg/dL (ref 2.5–4.6)
Phosphorus: 1.8 mg/dL — ABNORMAL LOW (ref 2.5–4.6)

## 2017-12-12 LAB — MAGNESIUM
MAGNESIUM: 2 mg/dL (ref 1.7–2.4)
Magnesium: 2 mg/dL (ref 1.7–2.4)

## 2017-12-12 LAB — CBC
HCT: 30.9 % — ABNORMAL LOW (ref 36.0–46.0)
Hemoglobin: 9.8 g/dL — ABNORMAL LOW (ref 12.0–15.0)
MCH: 27.8 pg (ref 26.0–34.0)
MCHC: 31.7 g/dL (ref 30.0–36.0)
MCV: 87.5 fL (ref 80.0–100.0)
NRBC: 0 % (ref 0.0–0.2)
Platelets: 317 10*3/uL (ref 150–400)
RBC: 3.53 MIL/uL — AB (ref 3.87–5.11)
RDW: 17.1 % — ABNORMAL HIGH (ref 11.5–15.5)
WBC: 10 10*3/uL (ref 4.0–10.5)

## 2017-12-12 LAB — BASIC METABOLIC PANEL
ANION GAP: 6 (ref 5–15)
BUN: 15 mg/dL (ref 8–23)
CO2: 22 mmol/L (ref 22–32)
CREATININE: 1.15 mg/dL — AB (ref 0.44–1.00)
Calcium: 9.2 mg/dL (ref 8.9–10.3)
Chloride: 105 mmol/L (ref 98–111)
GFR calc non Af Amer: 41 mL/min — ABNORMAL LOW (ref >60)
GFR, EST AFRICAN AMERICAN: 48 mL/min — AB (ref >60)
Glucose, Bld: 295 mg/dL — ABNORMAL HIGH (ref 70–99)
Potassium: 4.4 mmol/L (ref 3.5–5.1)
SODIUM: 133 mmol/L — AB (ref 135–145)

## 2017-12-12 LAB — BPAM RBC
Blood Product Expiration Date: 201911162359
ISSUE DATE / TIME: 201911112112
Unit Type and Rh: 5100

## 2017-12-12 LAB — TYPE AND SCREEN
ABO/RH(D): O POS
Antibody Screen: NEGATIVE
UNIT DIVISION: 0

## 2017-12-12 LAB — GLUCOSE, CAPILLARY
Glucose-Capillary: 261 mg/dL — ABNORMAL HIGH (ref 70–99)
Glucose-Capillary: 296 mg/dL — ABNORMAL HIGH (ref 70–99)
Glucose-Capillary: 285 mg/dL — ABNORMAL HIGH (ref 70–99)
Glucose-Capillary: 323 mg/dL — ABNORMAL HIGH (ref 70–99)
Glucose-Capillary: 319 mg/dL — ABNORMAL HIGH (ref 70–99)
Glucose-Capillary: 405 mg/dL — ABNORMAL HIGH (ref 70–99)
Glucose-Capillary: 337 mg/dL — ABNORMAL HIGH (ref 70–99)

## 2017-12-12 LAB — PREALBUMIN: Prealbumin: 7.6 mg/dL — ABNORMAL LOW (ref 18–38)

## 2017-12-12 MED ORDER — MEGESTROL ACETATE 400 MG/10ML PO SUSP
400.0000 mg | Freq: Every day | ORAL | Status: DC
  Administered 2017-12-12 – 2017-12-20 (×9): 400 mg via ORAL
  Filled 2017-12-12 (×9): qty 10

## 2017-12-12 MED ORDER — TRAVASOL 10 % IV SOLN
INTRAVENOUS | Status: AC
  Administered 2017-12-12: 18:00:00 via INTRAVENOUS
  Filled 2017-12-12: qty 374.4

## 2017-12-12 MED ORDER — SODIUM PHOSPHATES 45 MMOLE/15ML IV SOLN
30.0000 mmol | Freq: Once | INTRAVENOUS | Status: AC
  Administered 2017-12-12: 30 mmol via INTRAVENOUS
  Filled 2017-12-12: qty 10

## 2017-12-12 NOTE — Care Management Note (Signed)
Case Management Note  Patient Details  Name: [name] MRN: [id number] Date of Birth: August 18, 1927  Subjective/Objective:   Pt readmitted with AMS                 Action/Plan:  PTA from [facility] - plan will be to return to [facility] at discharge   Expected Discharge Date:                  Expected Discharge Plan:  Skilled Nursing Facility(From [facility])  In-House Referral:  Clinical Social Work  Discharge planning Services     Post Acute Care Choice:    Choice offered to:     DME Arranged:    DME Agency:     HH Arranged:    [place] Agency:     Status of Service:  In process, will continue to follow  If discussed at Long Length of Stay Meetings, dates discussed:    Additional Comments: 12/12/2017 Peg placed 12/10/17.  Pt currently receiving TPN and will continue to do so until pt is able to tolerate tube feeds. Maryclare Labrador, RN 12/12/2017, 9:28 AM

## 2017-12-12 NOTE — Progress Notes (Signed)
PROGRESS NOTE    Lillyanna Glandon  ZOX:[id number] DOB: 01/16/1928 DOA: 12/05/2017 PCP: Deland Pretty, MD    Brief Narrative:  [name] is a 84 y.o. female with medical history significant for DM2, HTN, HLD, CAD s/p CABG, h/o CVA, CKD3, who was recently diagnosed with achalasia after a 50 lb weight loss over the past year. She was treated with botox earlier in October and sent to SNF with the hopes she would be able to swallow her food. She has not done well since that time, has only been able to drink small amounts of milk. She was noticed to be stuttering, having difficulty with articulation. And was sent to [facility] for evaluation of stroke. MRi brain was negative for CVA.  Meanwhile her oral intake has been very low and she is losing weight, and her son at bedside requesting PEG Placement. IR consulted.   Since she is declining  and has minimal to, no oral intake , we will go ahead and start TPN. She underwent PEG placement 11/10.  Continues TPN until she Is able to tolerate the tube feeds.   Assessment & Plan:   Principal Problem:   Dysarthria Active Problems:   Diabetes mellitus ([place])   Hyperlipidemia   Essential hypertension   Coronary atherosclerosis   Stroke (cerebrum) (HCC)   Stage 3 chronic kidney disease (HCC)   Acute lower UTI   Failure to thrive in adult   UTI (urinary tract infection)   Dysarthria:  Probably from UTI, urine cultures grew enterococcus we will discontinue IV Rocephin change it to IV ampicillin. Enterococcus is sensitive to IV ampicillin. Change to Amoxicillin via  Peg tube.   Completed 7 days of antibiotics. Will d/c antibiotics after today's dose.  MRI is negative for CVA. Non focal neuro exam.  Pt very lethargic and minimally participative with PT.    H/o Achalasia with 50 lb weight loss.  Failed treatment with botox.  IR consulted for PEG placement.  She underwent placement of percutaneous 9 F pull-through gastrostomy tube without any complications.    Since patient had Botox injection her distal esophageal sphincter is lax and there are chances of reflux.  Will make sure she is on PPI and watch for aspiration episodes while on tube feeds. Started on tube feeds at 48ml/hr and advance every 42ml every 12 hours.  Plan to discharge the patient in 24 hours to 48 hours when able to tolerate the tube feeds and we can go ahead and d.c the TPN tomorrow.    Mild dehydration:  - resolved.   Diabetes mellitus;  CBG (last 3)  Recent Labs    12/12/17 1104 12/12/17 1152 12/12/17 1707  GLUCAP 323* 319* 405*   Add novolog 3 units TIDAC and continue with SSI.   Hyperlipidemia: Resume statin.    Hypertension Sub optimal, prn hydralazine on board.  Currently on imdur, metoprolol 75 mg BID.    CAD:  No chest pain or sob at this time.     STAGE 3 CKD  Creatinine improved to 1.1, baseline of 1.4   Anemia of chronic disease Hemoglobin of 10 on admission, is probably a hemo concentrated sample from dehydration.  Hemoglobin dropped to 7  , I UNIT PRBC transfusion ordered. Repeat H&H is around 9.    Failure to thrive:  Nutrition consulted for recommendations. She has moderate to severe protein malnutrition.  Started her on TPN and watch for refeeding syndrome.    Chest pain , resolved within minutes: Initial EKG  with st t wave changes,  and troponin is 0.03.  Repeat  EKG shows normal sinus rhythm. No chest pain so far.    Hypokalemia Supplementation ordered.  Hypomagnesemia and hypophosphatemia Replaced ,repeat in the morning. .  Watch for refeeding syndrome.   DVT prophylaxis: HEPARIN SQ Code Status: full code.  Family Communication: none at bedside today. Discussed with son on 11/11.  Disposition Plan: Possible discharge in tomorrow.    Consultants:   IR    Procedures: IR consulted for PEG   Antimicrobials: completed 7 days of antibiotics.    Subjective: Opens eyes to verbal cues and calling her name.   Following simple commands.  Per RN, she had some pink colored discharge from vagina. o exam today, there was no discharge  From vagina or urethra or around the purewick catheter.   Objective: Vitals:   12/12/17 0750 12/12/17 0751 12/12/17 0810 12/12/17 1709  BP: (!) 162/84 (!) 152/82 (!) 148/86 (!) 177/86  Pulse:    88  Resp:    18  Temp:    98.3 F (36.8 C)  TempSrc:    Oral  SpO2:    100%  Weight:        Intake/Output Summary (Last 24 hours) at 12/12/2017 1831 Last data filed at 12/12/2017 1713 Gross per 24 hour  Intake 3468.03 ml  Output 600 ml  Net 2868.03 ml   Filed Weights   12/07/17 2009 12/12/17 0419 12/12/17 0500  Weight: 46.6 kg 52.6 kg 46.6 kg    Examination:  General exam: cachetic and frail looking lady, no distress noted Respiratory system:  Clear to auscultation bilaterally, no wheezing or rhonchi Cardiovascular system: S1 & S2 heard, RRR. No JVD,. No pedal edema. Gastrointestinal system: Abdomen is soft, NT ND BS+ PEG tube in place,  Central nervous system: lethargic, opens eyes briefly to calling her name, following simple commands.   Extremities: bilateral lower extremities in boots.  Skin: No rashes, lesions or ulcers Psychiatry: calm, no agitation overnight.     Data Reviewed: I have personally reviewed following labs and imaging studies  CBC: Recent Labs  Lab 12/06/17 0628 12/08/17 0840 12/09/17 0514 12/11/17 1148 12/12/17 0313  WBC 8.3 6.1 6.5 6.3 10.0  HGB 8.5* 7.5* 7.7* 7.0* 9.8*  HCT 28.3* 24.5* 25.8* 23.0* 30.9*  MCV 88.7 88.8 90.8 90.2 87.5  PLT 470* 347 278 275 323   Basic Metabolic Panel: Recent Labs  Lab 12/08/17 0840 12/09/17 0514 12/10/17 0358 12/11/17 1148 12/11/17 1729 12/12/17 0313  NA 142 139 140 136  --  133*  K 3.3* 3.8 3.5 3.4*  --  4.4  CL 113* 112* 113* 108  --  105  CO2 23 20* 23 24  --  22  GLUCOSE 206* 181* 198* 212*  --  295*  BUN 10 10 10 10   --  15  CREATININE 1.11* 1.11* 1.13* 1.10*  --  1.15*   CALCIUM 10.1 10.0 9.7 9.1  --  9.2  MG  --   --  1.5* 1.9 2.0 2.0  PHOS  --   --  2.4* 2.5 2.5 1.8*   GFR: Estimated Creatinine Clearance: 24.9 mL/min (A) (by C-G formula based on SCr of 1.15 mg/dL (H)). Liver Function Tests: Recent Labs  Lab 12/10/17 0358 12/11/17 1148  AST 11* 12*  ALT 7 <5  ALKPHOS 44 42  BILITOT 0.7 0.3  PROT 6.2* 5.5*  ALBUMIN 2.0* 1.6*   No results for input(s): LIPASE, AMYLASE in the last  168 hours. No results for input(s): AMMONIA in the last 168 hours. Coagulation Profile: Recent Labs  Lab 12/07/17 0625  INR 1.16   Cardiac Enzymes: Recent Labs  Lab 12/06/17 2052  TROPONINI 0.03*   BNP (last 3 results) No results for input(s): PROBNP in the last 8760 hours. HbA1C: No results for input(s): HGBA1C in the last 72 hours. CBG: Recent Labs  Lab 12/12/17 0421 12/12/17 0741 12/12/17 1104 12/12/17 1152 12/12/17 1707  GLUCAP 261* 285* 323* 319* 405*   Lipid Profile: No results for input(s): CHOL, HDL, LDLCALC, TRIG, CHOLHDL, LDLDIRECT in the last 72 hours. Thyroid Function Tests: No results for input(s): TSH, T4TOTAL, FREET4, T3FREE, THYROIDAB in the last 72 hours. Anemia Panel: No results for input(s): VITAMINB12, FOLATE, FERRITIN, TIBC, IRON, RETICCTPCT in the last 72 hours. Sepsis Labs: No results for input(s): PROCALCITON, LATICACIDVEN in the last 168 hours.  Recent Results (from the past 240 hour(s))  Urine Culture     Status: Abnormal   Collection Time: 12/05/17 12:40 PM  Result Value Ref Range Status   Specimen Description URINE, RANDOM  Final   Special Requests   Final    NONE Performed at [facility], [address], [place], [place] [address]    Culture >=100,000 COLONIES/mL ENTEROCOCCUS FAECALIS (A)  Final   Report Status 12/09/2017 FINAL  Final   Organism ID, Bacteria ENTEROCOCCUS FAECALIS (A)  Final      Susceptibility   Enterococcus faecalis - MIC*    AMPICILLIN <=2 SENSITIVE Sensitive     LEVOFLOXACIN 1  SENSITIVE Sensitive     NITROFURANTOIN <=16 SENSITIVE Sensitive     VANCOMYCIN 1 SENSITIVE Sensitive     * >=100,000 COLONIES/mL ENTEROCOCCUS FAECALIS  MRSA PCR Screening     Status: Abnormal   Collection Time: 12/06/17  6:31 AM  Result Value Ref Range Status   MRSA by PCR POSITIVE (A) NEGATIVE Final    Comment:        The GeneXpert MRSA Assay (FDA approved for NASAL specimens only), is one component of a comprehensive MRSA colonization surveillance program. It is not intended to diagnose MRSA infection nor to guide or monitor treatment for MRSA infections. RESULT CALLED TO, READ BACK BY AND VERIFIED WITH: Marta Antu RN 10:25 12/06/17 ([email or web address]) Performed at [facility], [place] [address], [place], [place] [address]          Radiology Studies: No results found.      Scheduled Meds: . sodium chloride   Intravenous Once  . atorvastatin  40 mg Per Tube q1800  . cholecalciferol  400 Units Per Tube Daily  . ferrous sulfate  300 mg Per Tube Daily  . free water  100 mL Per Tube Q8H  . heparin injection (subcutaneous)  5,000 Units Subcutaneous Q8H  . insulin aspart  0-15 Units Subcutaneous Q4H  . isosorbide mononitrate  30 mg Oral Daily  . megestrol  400 mg Oral Daily  . metoprolol tartrate  75 mg Oral BID  . mirtazapine  7.5 mg Per Tube QHS  . multivitamin  15 mL Per Tube Daily  . pantoprazole sodium  40 mg Per Tube BID  . polyethylene glycol  17 g Per Tube Daily  . sennosides  5 mL Per Tube QHS   Continuous Infusions: . ampicillin (OMNIPEN) IV 1 g (12/12/17 1424)  .  ceFAZolin (ANCEF) IV 2 g (12/10/17 0905)  . feeding supplement (JEVITY 1.2 CAL) 35 mL/hr at 12/12/17 1106  . TPN ADULT (  ION) 30 mL/hr at 12/12/17 1736     LOS: 3 days    Time spent: 30 minutes.     Hosie Poisson, MD [facility] Pager [phone]  If 7PM-7AM, please contact night-coverage [email or web address] Password [email or web address] 12/12/2017, 6:31 PM

## 2017-12-12 NOTE — Progress Notes (Signed)
Occupational Therapy Treatment Patient Details Name: [name] MRN: [id number] DOB: 04-Dec-1927 Today's Date: 12/12/2017    History of present illness 84 yo female with onset of UTI and AMS was admitted with ongoing loss of wgt and dehydration resulting from achalasia with poor result from botox in visit from 11/20/17.  Pt has FTT, has lost 50 pounds in last year.  Intake of mainly milk and is expecting to have g tube per son to restore recent I gait on RW and  I self care.  PMHx:  CAD, AKI, angina, cardiomegaly, CABG, CVA with L hemi, CKD 3, transfusion, pleural effusion, DM, achalasia with botox procedure. G-tube placement 12/10/17.    OT comments  Pt demonstrates EOB sitting mod (A)with strong R lean and R gaze preference. Pt with L rib flair and R rib closed due to posture.  Total +2 Max (A) with therapist blocking moving legs to chair for transfer. Pt in chair able to sustain a more neutral posture. Pt noted to have vaginal bleeding with transfer.    Follow Up Recommendations  SNF    Equipment Recommendations  Wheelchair (measurements OT);Wheelchair cushion (measurements OT);Hospital bed;Other (comment)(air mattress overlay)    Recommendations for Other Services Other (comment)(Palliative consult)    Precautions / Restrictions Precautions Precautions: Fall Precaution Comments: coccygeal wound, Prevalon boots/ Peg 11/10 with abdominal binder, geo mat Restrictions Weight Bearing Restrictions: No       Mobility Bed Mobility Overal bed mobility: Needs Assistance Bed Mobility: Rolling;Sidelying to Sit Rolling: Max assist;+2 for physical assistance Sidelying to sit: Max assist;+2 for physical assistance;HOB elevated       General bed mobility comments: Rolling for peri-care. +2 assist required for transition to EOB and bedpad utilized to assist with scooting.   Transfers Overall transfer level: Needs assistance Equipment used: 2 person hand held assist Transfers: Stand  Pivot Transfers   Stand pivot transfers: Max assist;+2 physical assistance       General transfer comment: VC's for improved posture throughout. Bed pad utilized for increased support under hips. Therapist assisted in advancing feet around to chair.     Balance Overall balance assessment: Needs assistance Sitting-balance support: Feet supported;Bilateral upper extremity supported Sitting balance-Leahy Scale: Poor Sitting balance - Comments: Mod assist posteriorly and to the right to maintain upright posture. Pt was able to laterally lean to the left and bear weight through L elbow/forearm, however had difficulty pushing back up into sitting with LUE. Instead, utilizing RUE to pull herself into sitting. Overall pt sat ~10 minutes EOB.  Postural control: Left lateral lean;Posterior lean Standing balance support: Bilateral upper extremity supported;During functional activity Standing balance-Leahy Scale: Zero Standing balance comment: +2 assist required.                            ADL either performed or assessed with clinical judgement   ADL Overall ADL's : Needs assistance/impaired Eating/Feeding: NPO   Grooming: Sitting;Minimal assistance Grooming Details (indicate cue type and reason): mod cues and incr time. pt completing the task partically and needs (A) t0 complete                       Toileting - Clothing Manipulation Details (indicate cue type and reason): total (A) for peri care due to incontinence and lack of awareness. ** Of note is vaginal bleeding and RN informed       General ADL Comments: Pt completed supine to EOB sitting then  transferred to chair on Left side total +2 max (A). pt does initiate task and demonstrate weight bearing in legs      Vision       Perception     Praxis      Cognition Arousal/Alertness: Lethargic Behavior During Therapy: Flat affect Overall Cognitive Status: Difficult to assess Area of Impairment:  Attention;Following commands;Safety/judgement;Awareness                   Current Attention Level: Sustained   Following Commands: Follows one step commands inconsistently;Follows one step commands with increased time   Awareness: Intellectual Problem Solving: Slow processing;Decreased initiation;Difficulty sequencing;Requires verbal cues General Comments: pt requires incr time to initiate and unable to sustain task without additional cues.         Exercises     Shoulder Instructions       General Comments vaginal bleeding with bright red color consistently throughout session.    Pertinent Vitals/ Pain       Pain Assessment: Faces Faces Pain Scale: Hurts a little bit Pain Location: general grimace with movement Pain Descriptors / Indicators: Grimacing Pain Intervention(s): Monitored during session;Premedicated before session;Repositioned  Home Living                                          Prior Functioning/Environment              Frequency  Min 2X/week        Progress Toward Goals  OT Goals(current goals can now be found in the care plan section)  Progress towards OT goals: Progressing toward goals  Acute Rehab OT Goals Patient Stated Goal: None stated this session OT Goal Formulation: Patient unable to participate in goal setting Time For Goal Achievement: 12/20/17 Potential to Achieve Goals: Good ADL Goals Pt Will Perform Eating: with set-up;with supervision;sitting Pt Will Perform Grooming: with supervision;sitting;with set-up Pt Will Perform Upper Body Dressing: with min guard assist;sitting Pt Will Perform Lower Body Dressing: with supervision;sit to/from stand Pt Will Transfer to Toilet: with mod assist;stand pivot transfer;bedside commode Pt Will Perform Toileting - Clothing Manipulation and hygiene: with supervision;sit to/from stand Additional ADL Goal #1: Pt will perform bed mobility with Min A in preparation for ADLs   Plan Discharge plan remains appropriate    Co-evaluation    PT/OT/SLP Co-Evaluation/Treatment: Yes Reason for Co-Treatment: Complexity of the patient's impairments (multi-system involvement) PT goals addressed during session: Mobility/safety with mobility;Balance;Strengthening/ROM OT goals addressed during session: ADL's and self-care;Proper use of Adaptive equipment and DME;Strengthening/ROM      AM-PAC PT "6 Clicks" Daily Activity     Outcome Measure   Help from another person eating meals?: A Little Help from another person taking care of personal grooming?: A Lot Help from another person toileting, which includes using toliet, bedpan, or urinal?: A Lot Help from another person bathing (including washing, rinsing, drying)?: A Lot Help from another person to put on and taking off regular upper body clothing?: A Lot Help from another person to put on and taking off regular lower body clothing?: Total 6 Click Score: 12    End of Session Equipment Utilized During Treatment: Gait belt  OT Visit Diagnosis: Unsteadiness on feet (R26.81);Other abnormalities of gait and mobility (R26.89);Muscle weakness (generalized) (M62.81)   Activity Tolerance Patient limited by fatigue   Patient Left in chair;with call bell/phone within reach;with chair alarm set  Nurse Communication Mobility status;Precautions        Time: 4932-4199 OT Time Calculation (min): 23 min  Charges: OT General Charges $OT Visit: 1 Visit OT Treatments $Self Care/Home Management : 8-22 mins   Jeri Modena, OTR/L  Acute Rehabilitation Services Pager: [phone] Office: [phone] .    Parke Poisson B 12/12/2017, 9:13 AM

## 2017-12-12 NOTE — Progress Notes (Signed)
Physical Therapy Treatment Patient Details Name: Jaton Eilers MRN: [id number] DOB: August 07, 1927 Today's Date: 12/12/2017    History of Present Illness 84 yo female with onset of UTI and AMS was admitted with ongoing loss of wgt and dehydration resulting from achalasia with poor result from botox in visit from 11/20/17.  Pt has FTT, has lost 50 pounds in last year.  Intake of mainly milk and is expecting to have g tube per son to restore recent I gait on RW and  I self care.  PMHx:  CAD, AKI, angina, cardiomegaly, CABG, CVA with L hemi, CKD 3, transfusion, pleural effusion, DM, achalasia with botox procedure. G-tube placement 12/10/17.     PT Comments    Pt seen in conjunction with OT this session to maximize safety and progression of functional mobility. No family present at the time of PT session. Pt was able to sit EOB ~10 minutes today with up to mod assist for truncal support. +2 max assist provided for SPT bed>chair, and required assist to advance feet. Feel the Charlaine Dalton would also be appropriate for back to bed with nursing staff. Will continue to follow and progress as able per POC.   Follow Up Recommendations  SNF     Equipment Recommendations  None recommended by PT    Recommendations for Other Services       Precautions / Restrictions Precautions Precautions: Fall Precaution Comments: coccygeal wound, Prevalon boots Restrictions Weight Bearing Restrictions: No    Mobility  Bed Mobility Overal bed mobility: Needs Assistance Bed Mobility: Rolling;Sidelying to Sit Rolling: Max assist;+2 for physical assistance Sidelying to sit: Max assist;+2 for physical assistance;HOB elevated       General bed mobility comments: Rolling for peri-care. +2 assist required for transition to EOB and bedpad utilized to assist with scooting.   Transfers Overall transfer level: Needs assistance Equipment used: 2 person hand held assist Transfers: Stand Pivot Transfers   Stand pivot  transfers: Max assist;+2 physical assistance       General transfer comment: VC's for im,proved posture throughout. Bed pad utilized for increased support under hips. Therapist assisted in advancing feet around to chair.   Ambulation/Gait             General Gait Details: Unable this session   Stairs             Wheelchair Mobility    Modified Rankin (Stroke Patients Only)       Balance Overall balance assessment: Needs assistance Sitting-balance support: Feet supported;Bilateral upper extremity supported Sitting balance-Leahy Scale: Poor Sitting balance - Comments: Mod assist posteriorly and to the right to maintain upright posture. Pt was able to laterally lean to the left and bear weight through L elbow/forearm, however had difficulty pushing back up into sitting with LUE. Instead, utilizing RUE to pull herself into sitting. Overall pt sat ~10 minutes EOB.  Postural control: Left lateral lean;Posterior lean Standing balance support: Bilateral upper extremity supported;During functional activity Standing balance-Leahy Scale: Zero Standing balance comment: +2 assist required.                             Cognition Arousal/Alertness: Lethargic(More alert than prior session) Behavior During Therapy: Flat affect Overall Cognitive Status: Difficult to assess Area of Impairment: Attention;Following commands;Safety/judgement;Awareness                   Current Attention Level: Sustained   Following Commands: Follows one step commands inconsistently;Follows one  step commands with increased time   Awareness: Intellectual Problem Solving: Slow processing;Decreased initiation;Difficulty sequencing;Requires verbal cues        Exercises      General Comments General comments (skin integrity, edema, etc.): Noted vaginal bleeding this session      Pertinent Vitals/Pain Pain Assessment: Faces Faces Pain Scale: Hurts a little bit Pain Location:  General grimacing with movement Pain Descriptors / Indicators: Grimacing;Discomfort;Moaning Pain Intervention(s): Monitored during session    Home Living                      Prior Function            PT Goals (current goals can now be found in the care plan section) Acute Rehab PT Goals Patient Stated Goal: None stated this session PT Goal Formulation: With family Time For Goal Achievement: 12/20/17 Potential to Achieve Goals: Fair Progress towards PT goals: Progressing toward goals    Frequency    Min 2X/week      PT Plan Current plan remains appropriate    Co-evaluation PT/OT/SLP Co-Evaluation/Treatment: Yes Reason for Co-Treatment: Complexity of the patient's impairments (multi-system involvement);For patient/therapist safety;To address functional/ADL transfers PT goals addressed during session: Mobility/safety with mobility;Balance;Strengthening/ROM        AM-PAC PT "6 Clicks" Daily Activity  Outcome Measure  Difficulty turning over in bed (including adjusting bedclothes, sheets and blankets)?: Unable Difficulty moving from lying on back to sitting on the side of the bed? : Unable Difficulty sitting down on and standing up from a chair with arms (e.g., wheelchair, bedside commode, etc,.)?: Unable Help needed moving to and from a bed to chair (including a wheelchair)?: Total Help needed walking in hospital room?: Total Help needed climbing 3-5 steps with a railing? : Total 6 Click Score: 6    End of Session Equipment Utilized During Treatment: Gait belt Activity Tolerance: Patient limited by lethargy;Patient limited by fatigue Patient left: in chair;with call bell/phone within reach;with chair alarm set Nurse Communication: Mobility status;Other (comment)([place], vaginal bleeding) PT Visit Diagnosis: Muscle weakness (generalized) (M62.81);Other abnormalities of gait and mobility (R26.89);Adult, failure to thrive (R62.7);Hemiplegia and  hemiparesis Hemiplegia - Right/Left: Left Hemiplegia - dominant/non-dominant: Non-dominant Hemiplegia - caused by: Unspecified     Time: 6644-0347 PT Time Calculation (min) (ACUTE ONLY): 34 min  Charges:  $Therapeutic Activity: 8-22 mins                     Rolinda Roan, PT, DPT Acute Rehabilitation Services Pager: [phone] Office: [phone]    Thelma Comp 12/12/2017, 8:57 AM

## 2017-12-12 NOTE — Progress Notes (Signed)
   12/12/17 1740  Provider Notification  Provider Name/Title Dr. Karleen Hampshire   Date Provider Notified 12/12/17  Time Provider Notified [phone]  Notification Type Page  Notification Reason Other (Comment) (CBG 405)  Response Other (Comment) (Ok togive 15 units)  Date of Provider Response 12/12/17  Time of Provider Response 1742

## 2017-12-12 NOTE — NC FL2 (Addendum)
[place] MEDICAID FL2 LEVEL OF CARE SCREENING TOOL     IDENTIFICATION  Patient Name: [name] Birthdate: 1927/09/13 Sex: female Admission Date (Current Location): 12/05/2017  [facility] and [place] Number:  Herbalist and Address:  [facility] [place], [place] [address] [place], [place] [address]      Provider Number: [id number]  Attending Physician Name and Address:  Myrtis Hopping, MD Relative Name and Phone Number:  Dr. Erick Alley - son, [phone] and [phone]; Daughter Kay Shippy - [phone]    Current Level of Care: Hospital Recommended Level of Care: [place] Prior Approval Number:    Date Approved/Denied:   PASRR Number: [id number] A - Eff. 12/14/09  Discharge Plan: SNF    Current Diagnoses: Patient Active Problem List   Diagnosis Date Noted  . Dysarthria 12/05/2017  . Acute lower UTI 12/05/2017  . Failure to thrive in adult 12/05/2017  . UTI (urinary tract infection) 12/05/2017  . Abnormal loss of weight   . Abnormal finding on GI tract imaging   . Nausea and vomiting   . Dysphagia   . Bilious vomiting with nausea   . Enlarged lymph node   . Pressure injury of skin 11/05/2017  . Dehydration 11/04/2017  . Abdominal pain 11/04/2017  . Quadriceps strain, left, subsequent encounter 05/02/2017  . Rectal bleed 10/13/2016  . Malnutrition of moderate degree 10/13/2016  . Chest pain with moderate risk for cardiac etiology 10/06/2016  . Labile blood pressure   . Stage 3 chronic kidney disease ([place])   . AKI (acute kidney injury) ([place])   . Acute blood loss anemia   . Small vessel disease ([place]) 09/22/2016  . Hemiparesis affecting left side as late effect of cerebrovascular accident (CVA) ([place]) 09/22/2016  . Gait disturbance, post-stroke 09/22/2016  . Stroke (cerebrum) ([place]) 09/20/2016  . Diabetes mellitus with complication ([place])   . Ischemic stroke ([place])   . Status post intraocular lens implant 06/23/2011  .  Glaucoma suspect 12/30/2010  . Macular hole 12/30/2010  . Nonproliferative diabetic retinopathy ([place]) 12/30/2010  . Syncope 06/17/2010  . CARDIOMYOPATHY 04/15/2010  . PLEURAL EFFUSION 04/14/2010  . SHORTNESS OF BREATH 04/14/2010  . CORONARY ATHEROSCLEROSIS NATIVE CORONARY ARTERY 01/19/2009  . Hyperlipidemia 01/06/2009  . Diabetes mellitus ([place]) 01/05/2009  . Essential hypertension 01/05/2009  . Coronary atherosclerosis 01/05/2009    Orientation RESPIRATION BLADDER Height & Weight     Self  Normal Incontinent, External catheter(External catheter placed 10/14-purwid) Weight: 102 lb 11.8 oz (46.6 kg)(scale on bed broken) Height:     BEHAVIORAL SYMPTOMS/MOOD NEUROLOGICAL BOWEL NUTRITION STATUS      Incontinent Supplemental feeding initiated with TPN and will be d/c'd after current bag on 12/13/17. See comments section below for additional nutrition information.  AMBULATORY STATUS COMMUNICATION OF NEEDS Skin   Total Care(Patient has been unable to ambulate with physical therapist) Verbally Other (Comment)(Stage 2 wound to medial sacrum with foam dressing)   PU Stage 2 Dressing: (Dressing changed PRN)                   Personal Care Assistance Level of Assistance  Bathing, Feeding, Dressing Bathing Assistance: Maximum assistance Feeding assistance: Maximum assistance(Gastrostomy Tube placed 11/10-20 Fr.-Location LUQ. Tube feeds - Jevity 25 ml/hr and advance 10 ml every 12 hours ) Dressing Assistance: Maximum assistance     Functional Limitations Info  Sight, Hearing, Speech Sight Info: Adequate Hearing Info: Adequate Speech Info: Impaired(Slurreed speech)    SPECIAL CARE FACTORS FREQUENCY  PT (By licensed PT), OT (By licensed OT)     PT Frequency: PT at SNF eval and treat.  Evaluated 11/6 and received therapy treatments during acute inpatient stay  OT Frequency: OT at SNF eval and treat. Evaluated 11/6 and received therapy treatments during acute inpatient stay             Contractures Contractures Info: Not present    Additional Factors Info  Code Status, Allergies, Insulin Sliding Scale, Isolation Precautions Code Status Info: Full Allergies Info: Contrast Media   Insulin Sliding Scale Info: 0-15 Units every 4 hours Isolation Precautions Info: MRSA PCR standing orders     Current Medications (12/12/2017):  This is the current hospital active medication list Current Facility-Administered Medications  Medication Dose Route Frequency Provider Last Rate Last Dose  . 0.9 %  sodium chloride infusion (Manually program via Guardrails IV Fluids)   Intravenous Once Hosie Poisson, MD      . acetaminophen (TYLENOL) tablet 500 mg  500 mg Oral Q6H PRN Janora Norlander, MD   500 mg at 12/11/17 1540  . ampicillin (OMNIPEN) 1 g in sodium chloride 0.9 % 100 mL IVPB  1 g Intravenous Q8H Hosie Poisson, MD 300 mL/hr at 12/12/17 1424 1 g at 12/12/17 1424  . atorvastatin (LIPITOR) tablet 40 mg  40 mg Per Tube [id number] Hosie Poisson, MD   40 mg at 12/11/17 1812  . ceFAZolin (ANCEF) IVPB 1 g/50 mL premix   Intravenous Continuous PRN Jacqulynn Cadet, MD 200 mL/hr at 12/10/17 0905 2 g at 12/10/17 0905  . cholecalciferol (VITAMIN D3) tablet 400 Units  400 Units Per Tube Daily Hosie Poisson, MD   400 Units at 12/12/17 1108  . feeding supplement (JEVITY 1.2 CAL) liquid 1,000 mL  1,000 mL Per Tube Continuous Hosie Poisson, MD 25 mL/hr at 12/11/17 1520 25 mL/hr at 12/11/17 1520  . fentaNYL (SUBLIMAZE) injection   Intravenous PRN Jacqulynn Cadet, MD   25 mcg at 12/10/17 0915  . ferrous sulfate 300 (60 Fe) MG/5ML syrup 300 mg  300 mg Per Tube Daily Hosie Poisson, MD   300 mg at 12/12/17 1108  . free water 100 mL  100 mL Per Tube Q8H Hosie Poisson, MD   100 mL at 12/12/17 1438  . glucagon (human recombinant) (GLUCAGEN) injection    PRN Jacqulynn Cadet, MD   1 mg at 12/10/17 0918  . heparin injection 5,000 Units  5,000 Units Subcutaneous Q8H Lore, Melissa A, RPH   5,000 Units at  12/11/17 1500  . hydrALAZINE (APRESOLINE) injection 5 mg  5 mg Intravenous Q6H PRN Hosie Poisson, MD   5 mg at 12/06/17 1206  . insulin aspart (novoLOG) injection 0-15 Units  0-15 Units Subcutaneous Q4H Reginia Naas, RPH   11 Units at 12/12/17 1121  . isosorbide mononitrate (IMDUR) 24 hr tablet 30 mg  30 mg Oral Daily Janora Norlander, MD   30 mg at 12/12/17 1107  . megestrol (MEGACE) 400 MG/10ML suspension 400 mg  400 mg Oral Daily Hosie Poisson, MD   400 mg at 12/12/17 1435  . metoprolol tartrate (LOPRESSOR) tablet 75 mg  75 mg Oral BID Hosie Poisson, MD   75 mg at 12/12/17 1107  . midazolam (VERSED) injection   Intravenous PRN Jacqulynn Cadet, MD   0.5 mg at 12/10/17 0920  . mirtazapine (REMERON) tablet 7.5 mg  7.5 mg Per Tube QHS Hosie Poisson, MD   7.5 mg at 12/11/17 2105  . multivitamin liquid 15  mL  15 mL Per Tube Daily Hosie Poisson, MD   15 mL at 12/12/17 1424  . nitroGLYCERIN (NITROSTAT) SL tablet 0.4 mg  0.4 mg Sublingual Q5 Min x 3 PRN Janora Norlander, MD      . pantoprazole sodium (PROTONIX) 40 mg/20 mL oral suspension 40 mg  40 mg Per Tube BID Hosie Poisson, MD   40 mg at 12/12/17 1109  . polyethylene glycol (MIRALAX / GLYCOLAX) packet 17 g  17 g Per Tube Daily Hosie Poisson, MD   17 g at 12/12/17 1108  . sennosides (SENOKOT) 8.8 MG/5ML syrup 5 mL  5 mL Per Tube QHS Hosie Poisson, MD   5 mL at 12/11/17 2105  . sodium chloride flush (NS) 0.9 % injection 10-40 mL  10-40 mL Intracatheter PRN Hosie Poisson, MD      . sodium phosphate 30 mmol in dextrose 5 % 250 mL infusion  30 mmol Intravenous Once Hosie Poisson, MD 43 mL/hr at 12/12/17 1106 30 mmol at 12/12/17 1106  . TPN ADULT (ION)   Intravenous Continuous TPN Karren Cobble, RPH 30 mL/hr at 12/11/17 1748    . TPN ADULT (ION)   Intravenous Continuous TPN Karren Cobble, [facility]         Discharge Medications: Please see discharge summary for a list of discharge medications.  Relevant Imaging Results:  Relevant  Lab Results:   Additional Information ss# [id number].  **G-Tube placed 11/10 - Jevity 1.2 @ 25 ml/hr; if tolerating, titrate by 10 mL q 8 hours until goal rate of 55 ml/hr. **Patient has bilateral prevalon boots.   Sable Feil, LCSW

## 2017-12-12 NOTE — Progress Notes (Signed)
Reported by PT that there was some dicharges coming from Vagina, small amount of bloody discharge unverified. Was checked by nurses and had reported pinkish stain from the pad. Son had mentioned that when patient have anticoagulants her hemoglobin drops, and [patient is on heparin. Reported to Dr. Karleen Hampshire, no further discharges noted. Patient will be on SCD per MD.

## 2017-12-12 NOTE — Progress Notes (Signed)
[place] NOTE   Pharmacy Consult for TPN Indication: Prolonged poor PO intake  Patient Measurements: Weight: 102 lb 11.8 oz (46.6 kg)(scale on bed broken)   Body mass index is 18.2 kg/m.  Assessment:  84 yo F presents from [facility] with stuttering and trouble getting her words out. Has been recently diagnosed with achlalasia and has had a 50 lb weight loss over the last year. Has had significant functional status decline over the past month. To have PEG tube placed but has been bumped from schedule for past 3 days. Pharmacy consulted for TPN. TPN is generally not indicated for short term use. Provider made aware but wishes to continue with starting TPN.  GI: Albumin low at 1.6. Prealbumin 7.6. Patient is at risk of refeeding. PPI po BID - Supple: Vit D 400/d, FESO4 300/d, Free water 100/8, MV liq, Miralax, Senna/hs>>change meds to per tube - G tube placed 11/10. Jevity added (goal 70ml/hr, currently at 59ml/hr) - Consider increasing Megace dose to 400-800mg /day for appetite.  Endo: Hx of DMs. CBGs now uncontrolled on TPN + TF (220-296) Insulin requirements in the past 24 hours: 34 units   Lytes: Phos low 1.8 today-replaced with NaPhos 70mmol x 1 Renal: Scr 1.1 stable. IVF stopped. Pulm: RA Cards: Norvasc10, Lipitor40, Imdur30, metoprolol IV Hepatobil: LFTs and Tbili ok. Neuro: Remeron ID: On ampicillin for UTI.  TPN Access: PICC Double Lumen TPN start date: 11/10 >> G tube: 11/10 Nutritional Goals (per RD recommendation on 11/6): KCal: 1450-1650 Protein: 60-75 g Fluid: > 1.4 L  Goal TPN rate is 60 mL/hr Initiate Jevity 1.2 @ 25 ml/hr and increase by 10 ml every 12 hours to goal rate of 55 ml/hr  Current Nutrition:  NPO for PEG placement  Plan:  ContinueTPN at 30 mL/hr tonight-can d/c tomorrow if TF ever get titrated. - This TPN provides 37 g of protein, 115 g of dextrose, and 22 g of lipids which provides 757 kCals per day,  meeting 50% of patient needs Electrolytes in TPN: standard concentration. Cl:Ac 1:2 Add MVI and trace elements to TPN Increase to moderate Q4h SSI and adjust as needed Change po meds>>per tube Supplement insulin outside of TPN since this is expected to stop. Tube feeds are not being titrated and patient has been at 55ml/hr for the last 18 hours. Spoke with RN about this.   [name], PharmD, BCPS Clinical Staff Pharmacist [phone] [name] 12/12/2017,9:49 AM

## 2017-12-13 DIAGNOSIS — R1310 Dysphagia, unspecified: Secondary | ICD-10-CM

## 2017-12-13 DIAGNOSIS — Z515 Encounter for palliative care: Secondary | ICD-10-CM

## 2017-12-13 DIAGNOSIS — Z7189 Other specified counseling: Secondary | ICD-10-CM

## 2017-12-13 LAB — GLUCOSE, CAPILLARY
Glucose-Capillary: 261 mg/dL — ABNORMAL HIGH (ref 70–99)
Glucose-Capillary: 281 mg/dL — ABNORMAL HIGH (ref 70–99)
Glucose-Capillary: 294 mg/dL — ABNORMAL HIGH (ref 70–99)
Glucose-Capillary: 321 mg/dL — ABNORMAL HIGH (ref 70–99)
Glucose-Capillary: 367 mg/dL — ABNORMAL HIGH (ref 70–99)
Glucose-Capillary: 413 mg/dL — ABNORMAL HIGH (ref 70–99)

## 2017-12-13 LAB — URINALYSIS, ROUTINE W REFLEX MICROSCOPIC
BILIRUBIN URINE: NEGATIVE
Glucose, UA: >=500 mg/dL — AB
Ketones, ur: NEGATIVE mg/dL
Nitrite: NEGATIVE
PH: 7 (ref 5.0–8.0)
Protein, ur: 100 mg/dL — AB
Specific Gravity, Urine: 1.016 (ref 1.005–1.030)

## 2017-12-13 LAB — RENAL FUNCTION PANEL
ALBUMIN: 1.6 g/dL — AB (ref 3.5–5.0)
Anion gap: 6 (ref 5–15)
BUN: 22 mg/dL (ref 8–23)
CO2: 25 mmol/L (ref 22–32)
Calcium: 9.1 mg/dL (ref 8.9–10.3)
Chloride: 103 mmol/L (ref 98–111)
Creatinine, Ser: 1.29 mg/dL — ABNORMAL HIGH (ref 0.44–1.00)
GFR, EST AFRICAN AMERICAN: 42 mL/min — AB (ref >60)
GFR, EST NON AFRICAN AMERICAN: 36 mL/min — AB (ref >60)
Glucose, Bld: 482 mg/dL — ABNORMAL HIGH (ref 70–99)
PHOSPHORUS: 2.1 mg/dL — AB (ref 2.5–4.6)
POTASSIUM: 4.7 mmol/L (ref 3.5–5.1)
Sodium: 134 mmol/L — ABNORMAL LOW (ref 135–145)

## 2017-12-13 LAB — CBC
HCT: 27.8 % — ABNORMAL LOW (ref 36.0–46.0)
Hemoglobin: 8.7 g/dL — ABNORMAL LOW (ref 12.0–15.0)
MCH: 27.7 pg (ref 26.0–34.0)
MCHC: 31.3 g/dL (ref 30.0–36.0)
MCV: 88.5 fL (ref 80.0–100.0)
NRBC: 0 % (ref 0.0–0.2)
Platelets: 246 10*3/uL (ref 150–400)
RBC: 3.14 MIL/uL — AB (ref 3.87–5.11)
RDW: 17.9 % — AB (ref 11.5–15.5)
WBC: 8.6 10*3/uL (ref 4.0–10.5)

## 2017-12-13 MED ORDER — INSULIN ASPART 100 UNIT/ML ~~LOC~~ SOLN
3.0000 [IU] | SUBCUTANEOUS | Status: DC
  Administered 2017-12-13 – 2017-12-14 (×4): 3 [IU] via SUBCUTANEOUS

## 2017-12-13 MED ORDER — INSULIN GLARGINE 100 UNIT/ML ~~LOC~~ SOLN
8.0000 [IU] | Freq: Every day | SUBCUTANEOUS | Status: DC
  Administered 2017-12-14 (×2): 8 [IU] via SUBCUTANEOUS
  Filled 2017-12-13 (×2): qty 0.08

## 2017-12-13 NOTE — Progress Notes (Signed)
PROGRESS NOTE    [name]  BOF:[id number] DOB: 1927-08-13 DOA: 12/05/2017 PCP: Deland Pretty, MD   Brief Narrative: [name] is a 84 y.o. femalewith medical history significant forDM2, HTN, HLD, CAD s/p CABG, h/o CVA, CKD3. She presented secondary to dysarthria. MRI significant for no stroke. Also with failure to thrive with PEG tube placed. Working up confusion.    Assessment & Plan:   Principal Problem:   Dysarthria Active Problems:   Diabetes mellitus ([place])   Hyperlipidemia   Essential hypertension   Coronary atherosclerosis   Stroke (cerebrum) (HCC)   Stage 3 chronic kidney disease (HCC)   Acute lower UTI   Failure to thrive in adult   UTI (urinary tract infection)   Dysarthria Unsure if improved. Patient is altered.  Metabolic encephalopathy Unknown etiology but possibly related to significant dehydration vs ?hypercalcemia vs infection. UTI treated with little improvement. Also possibly related to failure to thrive. MRI brain without acute process but did show chronic advance atherosclerotic disease. Patient is very different (decreased) from my last encounter about one month ago with regard to mental status. -Ionized calcium -Repeat urinalysis/urine culture  History of stroke CAD -Continue Lipitor  Essential hypertension -metoprolol -Imdur  CKD stage 3 Stable  Failure to thrive Patient is s/p PEG tube. Currently on tube feeding. TPN off today. -Continue tube feeding -SSI q4 hours -Lantus 8 units, Novolog 3 units q4 hours  Decreased urine output Unsure if this is true oliguria vs incontinence. -Strict in and out -Urinalysis/culture  Enterococcus UTI Patient is s/p ampicillin/amoxicillin. Treated.    Pressure Injury Documentation: Pressure Injury 11/04/17 Stage II -  Partial thickness loss of dermis presenting as a shallow open ulcer with a red, pink wound bed without slough. (Active)  11/04/17 1530   Location: Sacrum  Location  Orientation: Medial  Staging: Stage II -  Partial thickness loss of dermis presenting as a shallow open ulcer with a red, pink wound bed without slough.  Wound Description (Comments):   Present on Admission: Yes     Pressure Injury 11/04/17 Stage II -  Partial thickness loss of dermis presenting as a shallow open ulcer with a red, pink wound bed without slough. (Active)  11/04/17 1530   Location: Sacrum  Location Orientation: Medial  Staging: Stage II -  Partial thickness loss of dermis presenting as a shallow open ulcer with a red, pink wound bed without slough.  Wound Description (Comments):   Present on Admission: Yes     DVT prophylaxis: Heparin subq Code Status:   Code Status: Full Code Family Communication: Son at bedside Disposition Plan: Discharge to SNF pending further workup for confusion, possibly oliguria   Consultants:   None  Procedures:   11/10: PEG tube placement  Antimicrobials:  Ceftriaxone (11/5>>11/8)  Cefazolin (11/10)  Ampicillin (11/8>>11/12)   Subjective: Patient difficult to understand.  Objective: Vitals:   12/12/17 1709 12/12/17 2129 12/13/17 0349 12/13/17 0918  BP: (!) 177/86 (!) 151/72 139/66 (!) 181/101  Pulse: 88 93 83 98  Resp: 18 18 16 19   Temp: 98.3 F (36.8 C) 99.1 F (37.3 C) 98.5 F (36.9 C) 98.7 F (37.1 C)  TempSrc: Oral Oral Oral Oral  SpO2: 100% 100% 100% 100%  Weight:        Intake/Output Summary (Last 24 hours) at 12/13/2017 1150 Last data filed at 12/13/2017 0900 Gross per 24 hour  Intake 1838.36 ml  Output 5 ml  Net 1833.36 ml   Filed Weights   12/07/17 2009  12/12/17 0419 12/12/17 0500  Weight: 46.6 kg 52.6 kg 46.6 kg    Examination:  General exam: Appears calm and comfortable Respiratory system: Diminished throughout. Respiratory effort normal. Cardiovascular system: S1 & S2 heard, RRR. No murmurs, rubs, gallops or clicks. Gastrointestinal system: Abdomen is nondistended, soft and nontender. No  organomegaly or masses felt. Normal bowel sounds heard. Central nervous system: Alert. Not very verbal.  Extremities: No edema. No calf tenderness Skin: No cyanosis. No rashes Psychiatry: Judgement and insight appear normal. Mood & affect appropriate.     Data Reviewed: I have personally reviewed following labs and imaging studies  CBC: Recent Labs  Lab 12/08/17 0840 12/09/17 0514 12/11/17 1148 12/12/17 0313  WBC 6.1 6.5 6.3 10.0  HGB 7.5* 7.7* 7.0* 9.8*  HCT 24.5* 25.8* 23.0* 30.9*  MCV 88.8 90.8 90.2 87.5  PLT 347 278 275 481   Basic Metabolic Panel: Recent Labs  Lab 12/08/17 0840 12/09/17 0514 12/10/17 0358 12/11/17 1148 12/11/17 1729 12/12/17 0313 12/12/17 1759  NA 142 139 140 136  --  133*  --   K 3.3* 3.8 3.5 3.4*  --  4.4  --   CL 113* 112* 113* 108  --  105  --   CO2 23 20* 23 24  --  22  --   GLUCOSE 206* 181* 198* 212*  --  295*  --   BUN 10 10 10 10   --  15  --   CREATININE 1.11* 1.11* 1.13* 1.10*  --  1.15*  --   CALCIUM 10.1 10.0 9.7 9.1  --  9.2  --   MG  --   --  1.5* 1.9 2.0 2.0 2.0  PHOS  --   --  2.4* 2.5 2.5 1.8* 4.4   GFR: Estimated Creatinine Clearance: 24.9 mL/min (A) (by C-G formula based on SCr of 1.15 mg/dL (H)). Liver Function Tests: Recent Labs  Lab 12/10/17 0358 12/11/17 1148  AST 11* 12*  ALT 7 <5  ALKPHOS 44 42  BILITOT 0.7 0.3  PROT 6.2* 5.5*  ALBUMIN 2.0* 1.6*   No results for input(s): LIPASE, AMYLASE in the last 168 hours. No results for input(s): AMMONIA in the last 168 hours. Coagulation Profile: Recent Labs  Lab 12/07/17 0625  INR 1.16   Cardiac Enzymes: Recent Labs  Lab 12/06/17 2052  TROPONINI 0.03*   BNP (last 3 results) No results for input(s): PROBNP in the last 8760 hours. HbA1C: No results for input(s): HGBA1C in the last 72 hours. CBG: Recent Labs  Lab 12/12/17 1707 12/12/17 2131 12/13/17 0008 12/13/17 0348 12/13/17 0806  GLUCAP 405* 337* 321* 261* 281*   Lipid Profile: No results for  input(s): CHOL, HDL, LDLCALC, TRIG, CHOLHDL, LDLDIRECT in the last 72 hours. Thyroid Function Tests: No results for input(s): TSH, T4TOTAL, FREET4, T3FREE, THYROIDAB in the last 72 hours. Anemia Panel: No results for input(s): VITAMINB12, FOLATE, FERRITIN, TIBC, IRON, RETICCTPCT in the last 72 hours. Sepsis Labs: No results for input(s): PROCALCITON, LATICACIDVEN in the last 168 hours.  Recent Results (from the past 240 hour(s))  Urine Culture     Status: Abnormal   Collection Time: 12/05/17 12:40 PM  Result Value Ref Range Status   Specimen Description URINE, RANDOM  Final   Special Requests   Final    NONE Performed at [facility], [address] [place], [place] [address]    Culture >=100,000 COLONIES/mL ENTEROCOCCUS FAECALIS (A)  Final   Report Status 12/09/2017 FINAL  Final  Organism ID, Bacteria ENTEROCOCCUS FAECALIS (A)  Final      Susceptibility   Enterococcus faecalis - MIC*    AMPICILLIN <=2 SENSITIVE Sensitive     LEVOFLOXACIN 1 SENSITIVE Sensitive     NITROFURANTOIN <=16 SENSITIVE Sensitive     VANCOMYCIN 1 SENSITIVE Sensitive     * >=100,000 COLONIES/mL ENTEROCOCCUS FAECALIS  MRSA PCR Screening     Status: Abnormal   Collection Time: 12/06/17  6:31 AM  Result Value Ref Range Status   MRSA by PCR POSITIVE (A) NEGATIVE Final    Comment:        The GeneXpert MRSA Assay (FDA approved for NASAL specimens only), is one component of a comprehensive MRSA colonization surveillance program. It is not intended to diagnose MRSA infection nor to guide or monitor treatment for MRSA infections. RESULT CALLED TO, READ BACK BY AND VERIFIED WITH: Marta Antu RN 10:25 12/06/17 ([email or web address]) Performed at [facility], [place] [address], [place], [place] [address]          Radiology Studies: No results found.      Scheduled Meds: . sodium chloride   Intravenous Once  . atorvastatin  40 mg Per Tube q1800  . cholecalciferol  400 Units Per Tube Daily  .  ferrous sulfate  300 mg Per Tube Daily  . free water  100 mL Per Tube Q8H  . heparin injection (subcutaneous)  5,000 Units Subcutaneous Q8H  . insulin aspart  0-15 Units Subcutaneous Q4H  . isosorbide mononitrate  30 mg Oral Daily  . megestrol  400 mg Oral Daily  . metoprolol tartrate  75 mg Oral BID  . mirtazapine  7.5 mg Per Tube QHS  . multivitamin  15 mL Per Tube Daily  . pantoprazole sodium  40 mg Per Tube BID  . polyethylene glycol  17 g Per Tube Daily  . sennosides  5 mL Per Tube QHS   Continuous Infusions: .  ceFAZolin (ANCEF) IV 2 g (12/10/17 0905)  . feeding supplement (JEVITY 1.2 CAL) 55 mL/hr at 12/13/17 0614  . TPN ADULT (ION) 30 mL/hr at 12/12/17 1800     LOS: 4 days     Cordelia Poche, MD [facility] 12/13/2017, 11:50 AM  If 7PM-7AM, please contact night-coverage [email or web address]

## 2017-12-13 NOTE — Clinical Social Work Note (Signed)
Clinical Social Work Assessment  Patient Details  Name: [name] MRN: [id number] Date of Birth: 01-26-1928  Date of referral:  12/13/17               Reason for consult:  Facility Placement, Discharge Planning                Permission sought to share information with:  Family Supports Permission granted to share information::  No(Patient oriented to self only)  Name::        Agency::     Relationship::     Contact Information:     Housing/Transportation Living arrangements for the past 2 months:  Skilled Nursing Facility(Patient from [facility]) Source of Information:  Adult Children(Son, Erick Alley) Patient Interpreter Needed:  None Criminal Activity/Legal Involvement Pertinent to Current Situation/Hospitalization:  No - Comment as needed Significant Relationships:  Adult Children Lives with:  Adult Children(Patient originally lived with son) Do you feel safe going back to the place where you live?  No(Patient will discharge back to a skilled nursing facility) Need for family participation in patient care:  Yes (Comment)  Care giving concerns: Son expressed understanding that patient needs continued medical care and rehab. He indicated that the goal is to get patient's nutrition back so she can get stronger.  Social Worker assessment / plan: CSW talked with patient's son, Dr. Erick Alley (phone numbers: [phone] or [phone]) at the bedside regarding patient's discharge disposition.Son confirmed that patient came from [facility] and was admitted to SNF after discharging from [facility] on 10/25. Dr. Mayer Masker reported that his mother was not eating or drinking and came to [facility]. Son informed CSW that he did not hold his mother's bed at [facility]. Son aware and that his mom is currently getting nutrition via TPN and that this will be discontinued and patient will continue to get nutrition via tube feeding.   Employment status:  Retired Radio broadcast assistant) PT Recommendations:  [place] / Referral to community resources:  [facility] [facility](Son provided with SNF list for [facility])  Patient/Family's Response to care: No concerns expressed by son regarding patient's care during hospitalization.   Patient/Family's Understanding of and Emotional Response to Diagnosis, Current Treatment, and Prognosis: Son expressed understanding of patient's current medical status and her need for continued rehab. Dr. Mayer Masker advised CSW that he will be staying with his mom at SNF due to his concern with her current nutrition status and deconditioning.    Emotional Assessment Appearance:  Appears stated age Attitude/Demeanor/Rapport:  Unable to Assess(Patient was asleep during visit with son at bedside) Affect (typically observed):  Unable to Assess(Patient was asleep during visit with son at bedside) Orientation:  Oriented to Self Alcohol / Substance use:  Never Used Psych involvement (Current and /or in the community):  No (Comment)  Discharge Needs  Concerns to be addressed:  Discharge Planning Concerns Readmission within the last 30 days:  Yes Current discharge risk:  None Barriers to Discharge:  Continued Medical Work up   [employer] Mila Homer, [place] 12/13/2017, 11:25 AM

## 2017-12-13 NOTE — Consult Note (Signed)
Consultation Note Date: 12/13/2017   Patient Name: [name]  DOB: January 13, 1928  MRN: [id number]  Age / Sex: 84 y.o., female  PCP: Deland Pretty, MD Referring Physician: Mariel Aloe, MD  Reason for Consultation: Establishing goals of care and Psychosocial/spiritual support  HPI/Patient Profile: 84 y.o. female admitted on 12/05/2017 with significant past  medical history for DM2, HTN, HLD, CAD s/p CABG, h/o CVA, CKD3, who was recently diagnosed with achalasia after a 50 lb weight loss over the past year.  She was treated with botox earlier in October and sent to SNF with the hopes she would be able to swallow her food. She has not done well since that time, has only been able to drink small amounts of liquid.   Son feels like her weight loss and FTT could be reversible if she gets a G tube as she was reportedly very functional a month ago, walking with a walker and performing all of her ADLs.   Currently weaning off TPN and transitioning to full support via PEG.  Plan is for further disposition to SNF for rehab.  Family face treatment option decisions, advanced directive decisions and anticipatory care needs.  Clinical Assessment and Goals of Care:  This NP Wadie Lessen reviewed medical records, received report from team, assessed the patient and then meet at the patient's bedside along with her son/ Dr Erick Alley  to discuss diagnosis, prognosis, GOC, EOL wishes disposition and options.  Concept of Hospice and Palliative Care were discussed  A detailed discussion was had today regarding advanced directives.  Concepts specific to code status, artifical feeding and hydration, continued IV antibiotics and rehospitalization was had.  The difference between a aggressive medical intervention path  and a palliative comfort care path for this patient at this time was had.  Values and goals of care important to  patient and family were attempted to be elicited.  MOST form introduced  Natural trajectory and expectations at EOL were discussed.  Questions and concerns addressed.   Family encouraged to call with questions or concerns.    PMT will continue to support holistically.   HCPOA/ Son/ Landon Truax  is a Management consultant in [place] Planning:  Full code Discussion with son regarding  the poor outcomes for  patients with multiple co morbidities and frailty  With  CPR as an intervetnion.  He feels  strongly that if the patient is made a DNR/DNI we will not get the level of care that he is hoping for for his mother. Further education offered   Palliative Prophylaxis:   Aspiration, Bowel Regimen, Delirium Protocol, Frequent Pain Assessment and Oral Care  Additional Recommendations (Limitations, Scope, Preferences):  Full Scope Treatment   Family is open to all offered and available medical interventions to prolong life.  Psycho-social/Spiritual:   Desire for further Chaplaincy support:no   Prognosis:   Unable to determine  Discharge Planning: To Be Determined  Primary Diagnoses: Present on Admission: . Essential hypertension . Hyperlipidemia . Coronary atherosclerosis . Stroke (cerebrum) ([place]) . Stage 3 chronic kidney disease ([place]) . UTI (urinary tract infection)   I have reviewed the medical record, interviewed the patient and family, and examined the patient. The following aspects are pertinent.  Past Medical History:  Diagnosis Date  . Acute renal injury ([place]) 07/2016  . Anginal pain ([place]) 2004; 2011  . CAD (coronary artery disease)    status post stenting of the RCA status post CABG. (left internal mammary artery to  LAD, saphenous vein graft to second diagonal, saphenous vein  graft to obtuse marginal 1, saphenous vein graft to posterior  descending).  . Cardiomyopathy     Mildly reduced EF  (45%).   . Chronic kidney disease (CKD), stage III (moderate) ([place])   . History of blood transfusion 1960s   "related to anemia"  . HTN (hypertension)   . Pleural effusion 2011   S/P "bypass"  . Shortness of breath   . Stroke ([place]) 09/19/2016   "left sided weakness" (09/20/2016)  . Type II diabetes mellitus ([place])    Social History   Socioeconomic History  . Marital status: Married    Spouse name: Not on file  . Number of children: 2  . Years of education: Not on file  . Highest education level: Not on file  Occupational History  . Occupation: Retired  Scientific laboratory technician  . Financial resource strain: Not on file  . Food insecurity:    Worry: Not on file    Inability: Not on file  . Transportation needs:    Medical: Not on file    Non-medical: Not on file  Tobacco Use  . Smoking status: Never Smoker  . Smokeless tobacco: Never Used  Substance and Sexual Activity  . Alcohol use: No  . Drug use: No  . Sexual activity: Not Currently  Lifestyle  . Physical activity:    Days per week: Not on file    Minutes per session: Not on file  . Stress: Not on file  Relationships  . Social connections:    Talks on phone: Not on file    Gets together: Not on file    Attends religious service: Not on file    Active member of club or organization: Not on file    Attends meetings of clubs or organizations: Not on file    Relationship status: Not on file  Other Topics Concern  . Not on file  Social History Narrative  . Not on file   Family History  Problem Relation Age of Onset  . Diabetes Father 21  . Unexplained death Mother 5  . CAD Neg Hx    Scheduled Meds: . sodium chloride   Intravenous Once  . atorvastatin  40 mg Per Tube q1800  . cholecalciferol  400 Units Per Tube Daily  . ferrous sulfate  300 mg Per Tube Daily  . free water  100 mL Per Tube Q8H  . heparin injection (subcutaneous)  5,000 Units Subcutaneous Q8H  . insulin aspart  0-15 Units Subcutaneous Q4H  .  isosorbide mononitrate  30 mg Oral Daily  . megestrol  400 mg Oral Daily  . metoprolol tartrate  75 mg Oral BID  . mirtazapine  7.5 mg Per Tube QHS  . multivitamin  15 mL Per Tube Daily  . pantoprazole sodium  40 mg Per Tube BID  . polyethylene glycol  17 g Per Tube Daily  .  sennosides  5 mL Per Tube QHS   Continuous Infusions: .  ceFAZolin (ANCEF) IV 2 g (12/10/17 0905)  . feeding supplement (JEVITY 1.2 CAL) 55 mL/hr at 12/13/17 0614  . TPN ADULT (ION) 30 mL/hr at 12/12/17 1800   PRN Meds:.acetaminophen, ceFAZolin (ANCEF) IV, fentaNYL, glucagon (human recombinant), hydrALAZINE, midazolam, nitroGLYCERIN, sodium chloride flush Medications Prior to Admission:  Prior to Admission medications   Medication Sig Start Date End Date Taking? Authorizing Provider  acetaminophen (TYLENOL) 500 MG tablet Take 500 mg by mouth every 6 (six) hours as needed for headache.    Yes [provider]  amLODipine (NORVASC) 10 MG tablet TAKE ONE TABLET BY MOUTH ONCE DAILY. Patient taking differently: Take 10 mg by mouth daily.  07/27/17  Yes Minus Breeding, MD  atorvastatin (LIPITOR) 40 MG tablet Take 1 tablet (40 mg total) by mouth daily. Patient taking differently: Take 40 mg by mouth daily at 6 PM.  04/25/17  Yes Garvin Fila, MD  cholecalciferol (VITAMIN D) 400 units TABS tablet Take 1 tablet (400 Units total) by mouth daily. 10/06/16  Yes Angiulli, Lavon Paganini, PA-C  ferrous sulfate 220 (44 Fe) MG/5ML solution Take 10 mLs by mouth daily. 10/25/17  Yes [provider]  glipiZIDE (GLUCOTROL) 5 MG tablet Take 2 tablets (10 mg total) by mouth daily before breakfast. 11/24/17  Yes Nita Sells, MD  isosorbide mononitrate (IMDUR) 30 MG 24 hr tablet TAKE 1 TABLET BY MOUTH ONCE DAILY Patient taking differently: Take 30 mg by mouth daily.  09/22/17  Yes Minus Breeding, MD  megestrol (MEGACE) 20 MG tablet Take 20 mg by mouth at bedtime.   Yes [provider]  metoprolol tartrate 75 MG  TABS Take 75 mg by mouth 2 (two) times daily. 11/24/17  Yes Nita Sells, MD  mirtazapine (REMERON) 15 MG tablet Take 7.5 mg by mouth at bedtime.   Yes [provider]  nitroGLYCERIN (NITROSTAT) 0.4 MG SL tablet Place 1 tablet (0.4 mg total) under the tongue every 5 (five) minutes x 3 doses as needed for chest pain. 10/08/16  Yes Rosita Fire, Brittainy M, PA-C  pantoprazole (PROTONIX) 40 MG tablet Take 1 tablet (40 mg total) by mouth 2 (two) times daily. 11/24/17  Yes Nita Sells, MD  polyethylene glycol (MIRALAX / GLYCOLAX) packet Take 17 g by mouth daily. 10/16/16  Yes Regalado, Belkys A, MD  senna (SENOKOT) 8.6 MG TABS tablet Take 1 tablet (8.6 mg total) by mouth daily. Patient taking differently: Take 1 tablet by mouth at bedtime.  10/16/16  Yes Regalado, Belkys A, MD  feeding supplement, ENSURE ENLIVE, (ENSURE ENLIVE) LIQD Take 237 mLs by mouth 2 (two) times daily between meals. Patient not taking: Reported on 12/05/2017 11/24/17   Nita Sells, MD  megestrol (MEGACE) 400 MG/10ML suspension Take 10 mLs (400 mg total) by mouth daily. Patient not taking: Reported on 12/05/2017 11/24/17   Nita Sells, MD  protein supplement shake (PREMIER PROTEIN) LIQD Take 325 mLs (11 oz total) by mouth daily. Patient not taking: Reported on 11/04/2017 10/15/16   Elmarie Shiley, MD   Allergies  Allergen Reactions  . Contrast Media [Iodinated Diagnostic Agents]     Sensitivity, kidney issues   Review of Systems  Unable to perform ROS: Mental status change    Physical Exam  Constitutional: She appears lethargic. She appears cachectic. She appears ill.  Cardiovascular: Normal rate, regular rhythm and normal heart sounds.  Pulmonary/Chest: She has decreased breath sounds.  Neurological: She appears lethargic.  Skin: Skin is warm and dry.    Vital Signs: BP (!) 181/101 (BP Location: Left Arm)   Pulse 98   Temp 98.7 F (37.1 C) (Oral)   Resp 19   Wt 46.6 kg Comment:  scale on bed broken  SpO2 100%   BMI 18.20 kg/m  Pain Scale: PAINAD   Pain Score: 0-No pain   SpO2: SpO2: 100 % O2 Device:SpO2: 100 % O2 Flow Rate: .O2 Flow Rate (L/min): 2 L/min  IO: Intake/output summary:   Intake/Output Summary (Last 24 hours) at 12/13/2017 1145 Last data filed at 12/13/2017 0900 Gross per 24 hour  Intake 1838.36 ml  Output 5 ml  Net 1833.36 ml    LBM: Last BM Date: (PTA) Baseline Weight: Weight: 46.7 kg Most recent weight: Weight: 46.6 kg(scale on bed broken)     Palliative Assessment/Data:  30 % at best   Discussed with Dr Lonny Prude  Time In: 9449 Time Out: 1200 Time Total: 75 minutes Greater than 50%  of this time was spent counseling and coordinating care related to the above assessment and plan.  Signed by: Wadie Lessen, NP   Please contact Palliative Medicine Team phone at [phone] for questions and concerns.  For individual provider: See Shea Evans

## 2017-12-13 NOTE — Clinical Social Work Note (Signed)
CSW talked with son today regarding skilled facility placement. He wants patient to return to [facility], however per Melvenia Beam, admissions director, she will not know until Thursday if she will have a bed over the weekend. CSW initiated a facility search because of the uncertainty with [facility] and patient did get bed offers, however overall the facilities do not allow persons to stay all night, every night, and the son advised CSW that he would be staying at night with his mother, and did so at [facility].  CSW talked with MD and there is not a definite d/c date as he is still treating patient. CSW will follow-up with admissions director at [facility] on Thursday and provided SW intervention services as needed.  Dimitra Woodstock Givens, MSW, LCSW Licensed Clinical Social Worker [place] [phone]

## 2017-12-13 NOTE — Progress Notes (Signed)
Nutrition Follow-up  DOCUMENTATION CODES:   Non-severe (moderate) malnutrition in context of chronic illness, Underweight  INTERVENTION:   -TPN management per pharmacy; plan to d/c TPN tonight at 1800 as TF has been advanced to goal rate  -Continue Jevity 1.2 @ 55 ml/hr via PEG  100 ml free water flush every 8 hours   Complete TF regimen providing 1584 kcals, 73 grams protein, and 1365 ml free water, which meets 100% of estimated kcal and protein needs.   -If bolus feedings are desired, recommend:   1 can (237 mL) Jevity 1.5 QID via PEG  85 ml free water flush before and after each feeding administration  Regimen rovides 1420 kcals, 60 g of protein, and 1400 ml free water   NUTRITION DIAGNOSIS:   Moderate Malnutrition related to chronic illness(dysphagia 2/2 to achalasia) as evidenced by energy intake < or equal to 75% for > or equal to 1 month, mild fat depletion, moderate fat depletion, mild muscle depletion, moderate muscle depletion, energy intake < or equal to 50% for > or equal to 1 month.  Ongoing  GOAL:   Patient will meet greater than or equal to 90% of their needs  Met with TF  MONITOR:   PO intake, Supplement acceptance, Labs, Weight trends, Skin, I & O's  REASON FOR ASSESSMENT:   Consult Assessment of nutrition requirement/status, Poor PO  ASSESSMENT:   [name] is a 84 y.o. female with medical history significant for DM2, HTN, HLD, CAD s/p CABG, h/o CVA, CKD3, who was recently diagnosed with achalasia after a 50 lb weight loss over the past year. She was treated with botox earlier in October and sent to SNF with the hopes she would be able to swallow her food. She has not done well since that time, has only been able to drink small amounts of milk. Son is a Management consultant in [place] and has been visiting. Yesterday he noticed she was stuttering, having difficulty getting words out, and progressed until she seemed to have total aphasia last night.  She does report having dysuria and some lower abd pain. No N/V. Has a good appetite but avoids food because of the obstruction. Megace and remeron have not helped. Process of being worked up for G tube has been initiated and pt reportedly has appt with [facility] tomorrow morning. Son feels like her weight loss and FTT could be reversible if she gets a G tube as she was reportedly very functional a month ago, walking with a walker and performing all of her ADLs.   11/7- per IR notes, imaging revealed moderate sized hiatal hernia and colon in upper abdomen; recommending evaluating anatomy with fluoroscopy  11/8- refusing PO medications 11/8- PICC placed 11/10- TPN initiated, G-tube placed 11/11- G-tube cleared to use by IR; TF initiated  Reviewed I/O's: +2 L x 24 hours and +10.4 L since admission  Wt has been stable since admission.   Pt resting quietly at time of visit. Pt son present at bedside, who reports that pt is tolerating TF well. Noted that Jevity 1.2 infusing at goal rate of 55 ml/hr (pt also receiving 100 ml free water flush every 8 hours). Complete TF regimen providing 1584 kcals, 73 grams protein, and 1365 ml free water, which meets 100% of estimated kcal and protein needs.   Pt remains on TPN, however, will d/c after 1800 this evening. TPN infusing at 30 ml/hr, which provides 757 kcals and 37 grams protein.   Complete nutrition support regimen (  TF and TPN) providing 2341 kcals and 110 grams of protein, which is exceeding pt's kcal and protein needs.   Pt son with multiple questions regarding nutrition plan of care, including formula, goal rate, and transitions of care from hospital back to SNF. RD answered pt son's questions and assured that RD at SNF would follow pt's progress at SNF.   Labs reviewed: Mg and Phos WDL, CBGS: [phone] (inpatient orders for glycemic control are 0-15 units insulin aspart every 4 hours)  Diet Order:   Diet Order    None      EDUCATION NEEDS:    Education needs have been addressed  Skin:  Skin Integrity Issues:: Stage II, Other (Comment) Stage II: coccyx Other: partial thickness wounds to bilateral buttocks (likely from adhesive)  Last BM:  12/12/17  Height:   Ht Readings from Last 1 Encounters:  11/20/17 _0  (1.6 m)    Weight:   Wt Readings from Last 1 Encounters:  12/12/17 46.6 kg    Ideal Body Weight:  52.3 kg  BMI:  Body mass index is 18.2 kg/m.  Estimated Nutritional Needs:   Kcal:  1450-1650  Protein:  60-75 grams  Fluid:  > 1.4 L    [name] A. Jimmye Norman, RD, LDN, CDE Pager: [phone] After hours Pager: [phone]

## 2017-12-13 NOTE — Progress Notes (Signed)
[place] NOTE   Pharmacy Consult for TPN Indication: Prolonged poor PO intake  Patient Measurements: Weight: 102 lb 11.8 oz (46.6 kg)(scale on bed broken)   Body mass index is 18.2 kg/m.  Assessment:  84 yo F presents from [facility] with stuttering and trouble getting her words out. Has been recently diagnosed with achlalasia and has had a 50 lb weight loss over the last year. Has had significant functional status decline over the past month. To have PEG tube placed but has been bumped from schedule for past 3 days. Pharmacy consulted for TPN. TPN is generally not indicated for short term use. Provider made aware but wishes to continue with starting TPN.  GI: Albumin low at 1.6. Prealbumin 7.6. Patient is at risk of refeeding. PPI po BID - Supple: Vit D 400/d, FESO4 300/d, Free water 100/8, MV liq, Miralax, Senna/hs>>change meds to per tube - G tube placed 11/10. Jevity added (goal 4ml/hr, now at goal) - Megace added for appetite  Endo: Hx of DMs. CBGs now uncontrolled on TPN  Insulin requirements in the past 24 hours: 56 units   Lytes: Phos replaced Renal: Scr 1.1 stable. IVF stopped. Pulm: RA Cards: Norvasc10, Lipitor40, Imdur30, metoprolol IV Hepatobil: LFTs and Tbili ok. Neuro: Remeron ID: On ampicillin for UTI.  TPN Access: PICC Double Lumen TPN start date: 11/10 >> G tube: 11/10 Nutritional Goals (per RD recommendation on 11/6): KCal: 1450-1650 Protein: 60-75 g Fluid: > 1.4 L  Goal TPN rate is 60 mL/hr Initiate Jevity 1.2 @ 25 ml/hr and increase by 10 ml every 12 hours to goal rate of 55 ml/hr  Current Nutrition:  NPO for PEG placement  Plan:  D/c TPN after current bag today  [name], PharmD, [place] Clinical Staff Pharmacist [phone] [name] 12/13/2017,8:25 AM

## 2017-12-13 NOTE — Progress Notes (Signed)
Inpatient Diabetes Program Recommendations  AACE/ADA: New Consensus Statement on Inpatient Glycemic Control (2015)  Target Ranges:  Prepandial:   less than 140 mg/dL      Peak postprandial:   less than 180 mg/dL (1-2 hours)      Critically ill patients:  140 - 180 mg/dL   Lab Results  Component Value Date   GLUCAP 367 (H) 12/13/2017   HGBA1C 9.0 (H) 11/04/2017    Review of Glycemic Control Results for Unterreiner, QUANIKA (MRN [id number]) as of 12/13/2017 12:25  Ref. Range 12/13/2017 03:48 12/13/2017 08:06 12/13/2017 12:05  Glucose-Capillary Latest Ref Range: 70 - 99 mg/dL 261 (H) 281 (H) 367 (H)   Diabetes history: Type 2 DM Outpatient Diabetes medications: Glipizide 10 mg QAM, Trulicity 1.5 mg Q Sunday Current orders for Inpatient glycemic control: Novolog 0-15 units Q4H  Jevity 1.2 cal- 55 ml/hr TPN to be discontinued  Inpatient Diabetes Program Recommendations:     Patient has received 76 units of short acting insulin in 24 hours.   Tube feeds have started, thus explaining increase to blood glucose trends.   Consider starting Levemir 8 units QD and adding tube feed coverage: Novolog 3 units Q4H (hold if tube feeds are stopped).  Thanks, Bronson Curb, MSN, RNC-OB Diabetes Coordinator [phone] (8a-5p)

## 2017-12-14 DIAGNOSIS — Z7189 Other specified counseling: Secondary | ICD-10-CM

## 2017-12-14 DIAGNOSIS — Z515 Encounter for palliative care: Secondary | ICD-10-CM

## 2017-12-14 LAB — RENAL FUNCTION PANEL
Albumin: 1.5 g/dL — ABNORMAL LOW (ref 3.5–5.0)
Anion gap: 4 — ABNORMAL LOW (ref 5–15)
BUN: 21 mg/dL (ref 8–23)
CALCIUM: 8.4 mg/dL — AB (ref 8.9–10.3)
CO2: 24 mmol/L (ref 22–32)
CREATININE: 1.22 mg/dL — AB (ref 0.44–1.00)
Chloride: 106 mmol/L (ref 98–111)
GFR calc non Af Amer: 38 mL/min — ABNORMAL LOW (ref >60)
GFR, EST AFRICAN AMERICAN: 44 mL/min — AB (ref >60)
GLUCOSE: 256 mg/dL — AB (ref 70–99)
Phosphorus: 1.8 mg/dL — ABNORMAL LOW (ref 2.5–4.6)
Potassium: 4 mmol/L (ref 3.5–5.1)
SODIUM: 134 mmol/L — AB (ref 135–145)

## 2017-12-14 LAB — CBC
HEMATOCRIT: 26.2 % — AB (ref 36.0–46.0)
Hemoglobin: 8.1 g/dL — ABNORMAL LOW (ref 12.0–15.0)
MCH: 27.6 pg (ref 26.0–34.0)
MCHC: 30.9 g/dL (ref 30.0–36.0)
MCV: 89.4 fL (ref 80.0–100.0)
PLATELETS: 236 10*3/uL (ref 150–400)
RBC: 2.93 MIL/uL — ABNORMAL LOW (ref 3.87–5.11)
RDW: 18.1 % — AB (ref 11.5–15.5)
WBC: 9.2 10*3/uL (ref 4.0–10.5)
nRBC: 0 % (ref 0.0–0.2)

## 2017-12-14 LAB — URINE CULTURE: Culture: NO GROWTH

## 2017-12-14 LAB — BLOOD GAS, VENOUS
Acid-Base Excess: 3.9 mmol/L — ABNORMAL HIGH (ref 0.0–2.0)
BICARBONATE: 27.1 mmol/L (ref 20.0–28.0)
O2 SAT: 97.8 %
PATIENT TEMPERATURE: 98.6
pCO2, Ven: 35.6 mmHg — ABNORMAL LOW (ref 44.0–60.0)
pH, Ven: 7.494 — ABNORMAL HIGH (ref 7.250–7.430)
pO2, Ven: 90.2 mmHg — ABNORMAL HIGH (ref 32.0–45.0)

## 2017-12-14 LAB — GLUCOSE, CAPILLARY
Glucose-Capillary: 185 mg/dL — ABNORMAL HIGH (ref 70–99)
Glucose-Capillary: 212 mg/dL — ABNORMAL HIGH (ref 70–99)
Glucose-Capillary: 213 mg/dL — ABNORMAL HIGH (ref 70–99)
Glucose-Capillary: 216 mg/dL — ABNORMAL HIGH (ref 70–99)
Glucose-Capillary: 220 mg/dL — ABNORMAL HIGH (ref 70–99)
Glucose-Capillary: 223 mg/dL — ABNORMAL HIGH (ref 70–99)
Glucose-Capillary: 227 mg/dL — ABNORMAL HIGH (ref 70–99)

## 2017-12-14 LAB — MAGNESIUM: Magnesium: 1.8 mg/dL (ref 1.7–2.4)

## 2017-12-14 LAB — CALCIUM, IONIZED: CALCIUM, IONIZED, SERUM: 5.7 mg/dL — AB (ref 4.5–5.6)

## 2017-12-14 LAB — OCCULT BLOOD X 1 CARD TO LAB, STOOL: Fecal Occult Bld: NEGATIVE

## 2017-12-14 MED ORDER — SODIUM CHLORIDE 0.9 % IV SOLN
INTRAVENOUS | Status: DC
  Administered 2017-12-14 – 2017-12-19 (×5): via INTRAVENOUS

## 2017-12-14 MED ORDER — ISOSORBIDE DINITRATE 10 MG PO TABS
10.0000 mg | ORAL_TABLET | Freq: Two times a day (BID) | ORAL | Status: DC
  Administered 2017-12-14 – 2017-12-22 (×18): 10 mg
  Filled 2017-12-14 (×8): qty 1
  Filled 2017-12-14: qty 2
  Filled 2017-12-14 (×11): qty 1

## 2017-12-14 MED ORDER — INSULIN ASPART 100 UNIT/ML ~~LOC~~ SOLN
5.0000 [IU] | SUBCUTANEOUS | Status: DC
  Administered 2017-12-14 – 2017-12-19 (×26): 5 [IU] via SUBCUTANEOUS

## 2017-12-14 NOTE — Progress Notes (Signed)
PROGRESS NOTE    [name]  WIO:[id number] DOB: November 07, 1927 DOA: 12/05/2017 PCP: Deland Pretty, MD   Brief Narrative: [name] is a 84 y.o. femalewith medical history significant forDM2, HTN, HLD, CAD s/p CABG, h/o CVA, CKD3. She presented secondary to dysarthria. MRI significant for no stroke. Also with failure to thrive with PEG tube placed. Working up confusion.    Assessment & Plan:   Principal Problem:   Dysarthria Active Problems:   Diabetes mellitus ([place])   Hyperlipidemia   Essential hypertension   Coronary atherosclerosis   Stroke (cerebrum) ([place])   Stage 3 chronic kidney disease ([place])   Acute lower UTI   Adult failure to thrive   UTI (urinary tract infection)   DNR (do not resuscitate) discussion   Palliative care by specialist   Dysarthria Unsure if improved. Patient is altered.  Metabolic encephalopathy Unknown etiology but possibly related to significant dehydration vs ?hypercalcemia vs infection. UTI treated with little improvement. Also possibly related to failure to thrive. MRI brain without acute process but did show chronic advance atherosclerotic disease. Patient is very different (decreased) from my last encounter about one month ago with regard to mental status. Urinalysis suggests possible inadequate treatment. -Ionized calcium still pending -Urine culture pending  History of stroke CAD -Continue Lipitor  Essential hypertension -metoprolol -Imdur (switched to immediate release per tube)  CKD stage 3 Stable  Failure to thrive Patient is s/p PEG tube. Currently on tube feeding. TPN off. -Continue tube feeding -SSI q4 hours -Lantus 8 units, Novolog 5 units q4 hours  Decreased urine output Unsure if this is true oliguria vs incontinence. -Strict in and out -Urinalysis/culture  Enterococcus UTI Patient is s/p ampicillin/amoxicillin. Treated.  DVT prophylaxis Concern about subcutaneous heparin causing possible bleeding. No  significant active bleeding. Will add SCDs, but patient is high risk for DVT. Unless source can be identified, would recommend continued heparin subq.    Pressure Injury Documentation: Pressure Injury 11/04/17 Stage II -  Partial thickness loss of dermis presenting as a shallow open ulcer with a red, pink wound bed without slough. (Active)  11/04/17 1530   Location: Sacrum  Location Orientation: Medial  Staging: Stage II -  Partial thickness loss of dermis presenting as a shallow open ulcer with a red, pink wound bed without slough.  Wound Description (Comments):   Present on Admission: Yes     Pressure Injury 11/04/17 Stage II -  Partial thickness loss of dermis presenting as a shallow open ulcer with a red, pink wound bed without slough. (Active)  11/04/17 1530   Location: Sacrum  Location Orientation: Medial  Staging: Stage II -  Partial thickness loss of dermis presenting as a shallow open ulcer with a red, pink wound bed without slough.  Wound Description (Comments):   Present on Admission: Yes     DVT prophylaxis: Heparin subq, SCDs Code Status:   Code Status: Full Code Family Communication: Son at bedside Disposition Plan: Discharge to SNF pending further workup for confusion, possibly oliguria   Consultants:   None  Procedures:   11/10: PEG tube placement  Antimicrobials:  Ceftriaxone (11/5>>11/8)  Cefazolin (11/10)  Ampicillin (11/8>>11/12)   Subjective: No issues overnight. Still altered.  Objective: Vitals:   12/13/17 1722 12/13/17 2038 12/14/17 0500 12/14/17 0510  BP: 121/80 138/63  (!) 177/77  Pulse: 91 93  95  Resp: 20 18  18   Temp: 99.6 F (37.6 C) 98 F (36.7 C)  98.9 F (37.2 C)  TempSrc: Oral Oral  Oral  SpO2: 99% 100%  100%  Weight:   53.1 kg     Intake/Output Summary (Last 24 hours) at 12/14/2017 1518 Last data filed at 12/14/2017 1323 Gross per 24 hour  Intake 1220.53 ml  Output 825 ml  Net 395.53 ml   Filed Weights    12/12/17 0419 12/12/17 0500 12/14/17 0500  Weight: 52.6 kg 46.6 kg 53.1 kg    Examination:  General exam: Appears calm and comfortable Respiratory system: Clear to auscultation. Respiratory effort normal. Cardiovascular system: S1 & S2 heard, RRR. No murmurs, rubs, gallops or clicks. Gastrointestinal system: Abdomen is nondistended, soft and nontender. Normal bowel sounds heard. Central nervous system: Sleeping Extremities: No edema. No calf tenderness Skin: No cyanosis. No rashes    Data Reviewed: I have personally reviewed following labs and imaging studies  CBC: Recent Labs  Lab 12/09/17 0514 12/11/17 1148 12/12/17 0313 12/13/17 1749 12/14/17 1237  WBC 6.5 6.3 10.0 8.6 9.2  HGB 7.7* 7.0* 9.8* 8.7* 8.1*  HCT 25.8* 23.0* 30.9* 27.8* 26.2*  MCV 90.8 90.2 87.5 88.5 89.4  PLT 278 275 317 246 734   Basic Metabolic Panel: Recent Labs  Lab 12/10/17 0358 12/11/17 1148 12/11/17 1729 12/12/17 0313 12/12/17 1759 12/13/17 1749 12/14/17 0958  NA 140 136  --  133*  --  134* 134*  K 3.5 3.4*  --  4.4  --  4.7 4.0  CL 113* 108  --  105  --  103 106  CO2 23 24  --  22  --  25 24  GLUCOSE 198* 212*  --  295*  --  482* 256*  BUN 10 10  --  15  --  22 21  CREATININE 1.13* 1.10*  --  1.15*  --  1.29* 1.22*  CALCIUM 9.7 9.1  --  9.2  --  9.1 8.4*  MG 1.5* 1.9 2.0 2.0 2.0  --  1.8  PHOS 2.4* 2.5 2.5 1.8* 4.4 2.1* 1.8*   GFR: Estimated Creatinine Clearance: 26.4 mL/min (A) (by C-G formula based on SCr of 1.22 mg/dL (H)). Liver Function Tests: Recent Labs  Lab 12/10/17 0358 12/11/17 1148 12/13/17 1749 12/14/17 0958  AST 11* 12*  --   --   ALT 7 <5  --   --   ALKPHOS 44 42  --   --   BILITOT 0.7 0.3  --   --   PROT 6.2* 5.5*  --   --   ALBUMIN 2.0* 1.6* 1.6* 1.5*   No results for input(s): LIPASE, AMYLASE in the last 168 hours. No results for input(s): AMMONIA in the last 168 hours. Coagulation Profile: No results for input(s): INR, PROTIME in the last 168  hours. Cardiac Enzymes: No results for input(s): CKTOTAL, CKMB, CKMBINDEX, TROPONINI in the last 168 hours. BNP (last 3 results) No results for input(s): PROBNP in the last 8760 hours. HbA1C: No results for input(s): HGBA1C in the last 72 hours. CBG: Recent Labs  Lab 12/13/17 1719 12/13/17 2038 12/14/17 0019 12/14/17 0422 12/14/17 0737  GLUCAP 413* 294* 223* 213* 227*   Lipid Profile: No results for input(s): CHOL, HDL, LDLCALC, TRIG, CHOLHDL, LDLDIRECT in the last 72 hours. Thyroid Function Tests: No results for input(s): TSH, T4TOTAL, FREET4, T3FREE, THYROIDAB in the last 72 hours. Anemia Panel: No results for input(s): VITAMINB12, FOLATE, FERRITIN, TIBC, IRON, RETICCTPCT in the last 72 hours. Sepsis Labs: No results for input(s): PROCALCITON, LATICACIDVEN in the last 168 hours.  Recent Results (from the past  240 hour(s))  Urine Culture     Status: Abnormal   Collection Time: 12/05/17 12:40 PM  Result Value Ref Range Status   Specimen Description URINE, RANDOM  Final   Special Requests   Final    NONE Performed at [facility], [place] [address] [place], [place] [address]    Culture >=100,000 COLONIES/mL ENTEROCOCCUS FAECALIS (A)  Final   Report Status 12/09/2017 FINAL  Final   Organism ID, Bacteria ENTEROCOCCUS FAECALIS (A)  Final      Susceptibility   Enterococcus faecalis - MIC*    AMPICILLIN <=2 SENSITIVE Sensitive     LEVOFLOXACIN 1 SENSITIVE Sensitive     NITROFURANTOIN <=16 SENSITIVE Sensitive     VANCOMYCIN 1 SENSITIVE Sensitive     * >=100,000 COLONIES/mL ENTEROCOCCUS FAECALIS  MRSA PCR Screening     Status: Abnormal   Collection Time: 12/06/17  6:31 AM  Result Value Ref Range Status   MRSA by PCR POSITIVE (A) NEGATIVE Final    Comment:        The GeneXpert MRSA Assay (FDA approved for NASAL specimens only), is one component of a comprehensive MRSA colonization surveillance program. It is not intended to diagnose MRSA infection nor to guide  or monitor treatment for MRSA infections. RESULT CALLED TO, READ BACK BY AND VERIFIED WITH: Marta Antu RN 10:25 12/06/17 ([email or web address]) Performed at [facility], [place] [address] [place], [place] [address]   Culture, Urine     Status: None   Collection Time: 12/13/17  6:19 PM  Result Value Ref Range Status   Specimen Description URINE, CLEAN CATCH  Final   Special Requests NONE  Final   Culture   Final    NO GROWTH Performed at [facility], [place] [address] [place], [place] [address]    Report Status 12/14/2017 FINAL  Final         Radiology Studies: No results found.      Scheduled Meds: . sodium chloride   Intravenous Once  . atorvastatin  40 mg Per Tube q1800  . cholecalciferol  400 Units Per Tube Daily  . ferrous sulfate  300 mg Per Tube Daily  . free water  100 mL Per Tube Q8H  . heparin injection (subcutaneous)  5,000 Units Subcutaneous Q8H  . insulin aspart  0-15 Units Subcutaneous Q4H  . insulin aspart  5 Units Subcutaneous Q4H  . insulin glargine  8 Units Subcutaneous QHS  . isosorbide dinitrate  10 mg Per Tube BID  . megestrol  400 mg Oral Daily  . metoprolol tartrate  75 mg Oral BID  . mirtazapine  7.5 mg Per Tube QHS  . multivitamin  15 mL Per Tube Daily  . pantoprazole sodium  40 mg Per Tube BID  . polyethylene glycol  17 g Per Tube Daily  . sennosides  5 mL Per Tube QHS   Continuous Infusions: . sodium chloride 75 mL/hr at 12/14/17 0905  .  ceFAZolin (ANCEF) IV 2 g (12/10/17 0905)  . feeding supplement (JEVITY 1.2 CAL) 1,000 mL (12/13/17 1931)     LOS: 5 days     Cordelia Poche, MD [facility] 12/14/2017, 3:18 PM  If 7PM-7AM, please contact night-coverage [email or web address]

## 2017-12-14 NOTE — Progress Notes (Signed)
Inpatient Diabetes Program Recommendations  AACE/ADA: New Consensus Statement on Inpatient Glycemic Control (2015)  Target Ranges:  Prepandial:   less than 140 mg/dL      Peak postprandial:   less than 180 mg/dL (1-2 hours)      Critically ill patients:  140 - 180 mg/dL   Results for ONIE, KASPAREK (MRN [id number]) as of 12/14/2017 10:10  Ref. Range 12/13/2017 20:38 12/14/2017 00:19 12/14/2017 04:22 12/14/2017 07:37  Glucose-Capillary Latest Ref Range: 70 - 99 mg/dL 294 (H) 223 (H) 213 (H) 227 (H)   Diabetes history: Type 2 DM Outpatient Diabetes medications: Glipizide 10 mg QAM, Trulicity 1.5 mg Q Sunday Current orders for Inpatient glycemic control: Lantus 8 units qhs, Novolog 0-15 units Q4H, Novolog 3 units q4 hours Tube feed coverage  Jevity 1.2 cal- 55 ml/hr TPN discontinued last night  Inpatient Diabetes Program Recommendations:    Glucose trends improved but still in the 200 range. Consider increasing Tube Feed Coverage to Novolog 5 units Q4 hours (hold if tube feeds are stopped).  Thanks, Tama Headings RN, MSN, BC-ADM Inpatient Diabetes Coordinator Team Pager [phone] (8a-5p)

## 2017-12-15 ENCOUNTER — Inpatient Hospital Stay (HOSPITAL_COMMUNITY): Payer: Medicare Other

## 2017-12-15 LAB — RENAL FUNCTION PANEL
ALBUMIN: 1.5 g/dL — AB (ref 3.5–5.0)
ANION GAP: 5 (ref 5–15)
BUN: 22 mg/dL (ref 8–23)
CALCIUM: 8.4 mg/dL — AB (ref 8.9–10.3)
CO2: 25 mmol/L (ref 22–32)
Chloride: 106 mmol/L (ref 98–111)
Creatinine, Ser: 1.32 mg/dL — ABNORMAL HIGH (ref 0.44–1.00)
GFR calc non Af Amer: 35 mL/min — ABNORMAL LOW (ref >60)
GFR, EST AFRICAN AMERICAN: 40 mL/min — AB (ref >60)
GLUCOSE: 266 mg/dL — AB (ref 70–99)
PHOSPHORUS: 1.9 mg/dL — AB (ref 2.5–4.6)
Potassium: 4.1 mmol/L (ref 3.5–5.1)
SODIUM: 136 mmol/L (ref 135–145)

## 2017-12-15 LAB — GLUCOSE, CAPILLARY
Glucose-Capillary: 190 mg/dL — ABNORMAL HIGH (ref 70–99)
Glucose-Capillary: 198 mg/dL — ABNORMAL HIGH (ref 70–99)
Glucose-Capillary: 198 mg/dL — ABNORMAL HIGH (ref 70–99)
Glucose-Capillary: 208 mg/dL — ABNORMAL HIGH (ref 70–99)
Glucose-Capillary: 208 mg/dL — ABNORMAL HIGH (ref 70–99)
Glucose-Capillary: 242 mg/dL — ABNORMAL HIGH (ref 70–99)
Glucose-Capillary: 246 mg/dL — ABNORMAL HIGH (ref 70–99)
Glucose-Capillary: 258 mg/dL — ABNORMAL HIGH (ref 70–99)
Glucose-Capillary: 260 mg/dL — ABNORMAL HIGH (ref 70–99)

## 2017-12-15 LAB — CBC
HCT: 25.9 % — ABNORMAL LOW (ref 36.0–46.0)
HEMOGLOBIN: 8 g/dL — AB (ref 12.0–15.0)
MCH: 28 pg (ref 26.0–34.0)
MCHC: 30.9 g/dL (ref 30.0–36.0)
MCV: 90.6 fL (ref 80.0–100.0)
NRBC: 0 % (ref 0.0–0.2)
Platelets: 238 10*3/uL (ref 150–400)
RBC: 2.86 MIL/uL — AB (ref 3.87–5.11)
RDW: 18.1 % — ABNORMAL HIGH (ref 11.5–15.5)
WBC: 9.9 10*3/uL (ref 4.0–10.5)

## 2017-12-15 MED ORDER — INSULIN GLARGINE 100 UNIT/ML ~~LOC~~ SOLN
15.0000 [IU] | Freq: Every day | SUBCUTANEOUS | Status: DC
  Administered 2017-12-15: 15 [IU] via SUBCUTANEOUS
  Filled 2017-12-15: qty 0.15

## 2017-12-15 NOTE — Progress Notes (Signed)
PROGRESS NOTE    [name]  NKN:[id number] DOB: 02/28/1927 DOA: 12/05/2017 PCP: Deland Pretty, MD   Brief Narrative: [name] is a 84 y.o. femalewith medical history significant forDM2, HTN, HLD, CAD s/p CABG, h/o CVA, CKD3. She presented secondary to dysarthria. MRI significant for no stroke. Also with failure to thrive with PEG tube placed. Working up confusion.    Assessment & Plan:   Principal Problem:   Dysarthria Active Problems:   Diabetes mellitus ([place])   Hyperlipidemia   Essential hypertension   Coronary atherosclerosis   Stroke (cerebrum) ([place])   Stage 3 chronic kidney disease ([place])   Acute lower UTI   Adult failure to thrive   UTI (urinary tract infection)   DNR (do not resuscitate) discussion   Palliative care by specialist   Dysarthria Appears to be improved.  Metabolic encephalopathy Unknown etiology but possibly related to significant dehydration vs ?hypercalcemia vs infection vs malnutrition. UTI treated with little improvement. Also possibly related to failure to thrive. MRI brain without acute process but did show chronic advance atherosclerotic disease. Patient is very different (decreased) from my last encounter about one month ago with regard to mental status. Urinalysis suggests possible inadequate treatment. Ionized calcium mildly elevated. Repeat urine culture with no growth. Appears to be improved today.  History of stroke CAD -Continue Lipitor  Essential hypertension -metoprolol -Imdur (switched to immediate release per tube)  CKD stage 3 Stable  Failure to thrive Patient is s/p PEG tube. Currently on tube feeding. TPN off. -Continue tube feeding -SSI q4 hours -Increase to Lantus 15 units and continue Novolog 5 units q4 hours  Decreased urine output Unsure if this is true oliguria vs incontinence. Appears to be related to incontinence. Urine culture with no growth. -Strict in and out  Enterococcus UTI Patient is s/p  ampicillin. Treated. Repeat urine culture with no growth  DVT prophylaxis Concern about subcutaneous heparin causing possible bleeding. No significant active bleeding. Will add SCDs, but patient is high risk for DVT. Unless source can be identified, would recommend continued heparin subq.  Decreased breath sounds Asymptomatic per patient -Portable chest x-ray    Pressure Injury Documentation: Pressure Injury 11/04/17 Stage II -  Partial thickness loss of dermis presenting as a shallow open ulcer with a red, pink wound bed without slough. (Active)  11/04/17 1530   Location: Sacrum  Location Orientation: Medial  Staging: Stage II -  Partial thickness loss of dermis presenting as a shallow open ulcer with a red, pink wound bed without slough.  Wound Description (Comments):   Present on Admission: Yes     Pressure Injury 11/04/17 Stage II -  Partial thickness loss of dermis presenting as a shallow open ulcer with a red, pink wound bed without slough. (Active)  11/04/17 1530   Location: Sacrum  Location Orientation: Medial  Staging: Stage II -  Partial thickness loss of dermis presenting as a shallow open ulcer with a red, pink wound bed without slough.  Wound Description (Comments):   Present on Admission: Yes     DVT prophylaxis: Heparin subq, SCDs Code Status:   Code Status: Full Code Family Communication: Son at bedside Disposition Plan: Discharge to SNF pending further workup for confusion, possibly oliguria   Consultants:   None  Procedures:   11/10: PEG tube placement  Antimicrobials:  Ceftriaxone (11/5>>11/8)  Cefazolin (11/10)  Ampicillin (11/8>>11/12)   Subjective: No chest pain or dyspnea  Objective: Vitals:   12/14/17 1741 12/14/17 1936 12/15/17 0358 12/15/17 0815  BP: (!) 148/65 (!) 142/69 (!) 173/99 (!) 176/91  Pulse: 100 87 97 96  Resp: 18 16 18 18   Temp: 98 F (36.7 C) 97.9 F (36.6 C) 98.4 F (36.9 C) 98.6 F (37 C)  TempSrc: Oral Oral  Oral Oral  SpO2: 100% 100% 97% 100%  Weight:  54 kg      Intake/Output Summary (Last 24 hours) at 12/15/2017 1013 Last data filed at 12/15/2017 0600 Gross per 24 hour  Intake 2449.58 ml  Output 300 ml  Net 2149.58 ml   Filed Weights   12/12/17 0500 12/14/17 0500 12/14/17 1936  Weight: 46.6 kg 53.1 kg 54 kg    Examination:  General exam: Appears calm and comfortable Respiratory system: Clear to auscultation but diminished on left on anterior auscultation. Respiratory effort normal. Cardiovascular system: S1 & S2 heard, RRR. No murmurs, rubs, gallops or clicks. Gastrointestinal system: Abdomen is nondistended, soft and nontender. No organomegaly or masses felt. Normal bowel sounds heard. Central nervous system: Much more alert today and oriented to person only. Follows commands. Extremities: No edema. No calf tenderness Skin: No cyanosis. No rashes    Data Reviewed: I have personally reviewed following labs and imaging studies  CBC: Recent Labs  Lab 12/11/17 1148 12/12/17 0313 12/13/17 1749 12/14/17 1237 12/15/17 0423  WBC 6.3 10.0 8.6 9.2 9.9  HGB 7.0* 9.8* 8.7* 8.1* 8.0*  HCT 23.0* 30.9* 27.8* 26.2* 25.9*  MCV 90.2 87.5 88.5 89.4 90.6  PLT 275 317 246 236 297   Basic Metabolic Panel: Recent Labs  Lab 12/11/17 1148 12/11/17 1729 12/12/17 0313 12/12/17 1759 12/13/17 1749 12/14/17 0958 12/15/17 0423  NA 136  --  133*  --  134* 134* 136  K 3.4*  --  4.4  --  4.7 4.0 4.1  CL 108  --  105  --  103 106 106  CO2 24  --  22  --  25 24 25   GLUCOSE 212*  --  295*  --  482* 256* 266*  BUN 10  --  15  --  22 21 22   CREATININE 1.10*  --  1.15*  --  1.29* 1.22* 1.32*  CALCIUM 9.1  --  9.2  --  9.1 8.4* 8.4*  MG 1.9 2.0 2.0 2.0  --  1.8  --   PHOS 2.5 2.5 1.8* 4.4 2.1* 1.8* 1.9*   GFR: Estimated Creatinine Clearance: 24.4 mL/min (A) (by C-G formula based on SCr of 1.32 mg/dL (H)). Liver Function Tests: Recent Labs  Lab 12/10/17 0358 12/11/17 1148 12/13/17 1749  12/14/17 0958 12/15/17 0423  AST 11* 12*  --   --   --   ALT 7 <5  --   --   --   ALKPHOS 44 42  --   --   --   BILITOT 0.7 0.3  --   --   --   PROT 6.2* 5.5*  --   --   --   ALBUMIN 2.0* 1.6* 1.6* 1.5* 1.5*   No results for input(s): LIPASE, AMYLASE in the last 168 hours. No results for input(s): AMMONIA in the last 168 hours. Coagulation Profile: No results for input(s): INR, PROTIME in the last 168 hours. Cardiac Enzymes: No results for input(s): CKTOTAL, CKMB, CKMBINDEX, TROPONINI in the last 168 hours. BNP (last 3 results) No results for input(s): PROBNP in the last 8760 hours. HbA1C: No results for input(s): HGBA1C in the last 72 hours. CBG: Recent Labs  Lab 12/14/17 2029 12/14/17  2347 12/15/17 0026 12/15/17 0402 12/15/17 0816  GLUCAP 216* 212* 190* 258* 242*   Lipid Profile: No results for input(s): CHOL, HDL, LDLCALC, TRIG, CHOLHDL, LDLDIRECT in the last 72 hours. Thyroid Function Tests: No results for input(s): TSH, T4TOTAL, FREET4, T3FREE, THYROIDAB in the last 72 hours. Anemia Panel: No results for input(s): VITAMINB12, FOLATE, FERRITIN, TIBC, IRON, RETICCTPCT in the last 72 hours. Sepsis Labs: No results for input(s): PROCALCITON, LATICACIDVEN in the last 168 hours.  Recent Results (from the past 240 hour(s))  Urine Culture     Status: Abnormal   Collection Time: 12/05/17 12:40 PM  Result Value Ref Range Status   Specimen Description URINE, RANDOM  Final   Special Requests   Final    NONE Performed at [facility], [address], [place], [place] [address]    Culture >=100,000 COLONIES/mL ENTEROCOCCUS FAECALIS (A)  Final   Report Status 12/09/2017 FINAL  Final   Organism ID, Bacteria ENTEROCOCCUS FAECALIS (A)  Final      Susceptibility   Enterococcus faecalis - MIC*    AMPICILLIN <=2 SENSITIVE Sensitive     LEVOFLOXACIN 1 SENSITIVE Sensitive     NITROFURANTOIN <=16 SENSITIVE Sensitive     VANCOMYCIN 1 SENSITIVE Sensitive     * >=100,000  COLONIES/mL ENTEROCOCCUS FAECALIS  MRSA PCR Screening     Status: Abnormal   Collection Time: 12/06/17  6:31 AM  Result Value Ref Range Status   MRSA by PCR POSITIVE (A) NEGATIVE Final    Comment:        The GeneXpert MRSA Assay (FDA approved for NASAL specimens only), is one component of a comprehensive MRSA colonization surveillance program. It is not intended to diagnose MRSA infection nor to guide or monitor treatment for MRSA infections. RESULT CALLED TO, READ BACK BY AND VERIFIED WITH: Marta Antu RN 10:25 12/06/17 ([email or web address]) Performed at [facility], [place] [address], [place], [place] [address]   Culture, Urine     Status: None   Collection Time: 12/13/17  6:19 PM  Result Value Ref Range Status   Specimen Description URINE, CLEAN CATCH  Final   Special Requests NONE  Final   Culture   Final    NO GROWTH Performed at [facility], [place] [address] [place], [place] [address]    Report Status 12/14/2017 FINAL  Final         Radiology Studies: No results found.      Scheduled Meds: . sodium chloride   Intravenous Once  . atorvastatin  40 mg Per Tube q1800  . cholecalciferol  400 Units Per Tube Daily  . ferrous sulfate  300 mg Per Tube Daily  . free water  100 mL Per Tube Q8H  . heparin injection (subcutaneous)  5,000 Units Subcutaneous Q8H  . insulin aspart  0-15 Units Subcutaneous Q4H  . insulin aspart  5 Units Subcutaneous Q4H  . insulin glargine  8 Units Subcutaneous QHS  . isosorbide dinitrate  10 mg Per Tube BID  . megestrol  400 mg Oral Daily  . metoprolol tartrate  75 mg Oral BID  . mirtazapine  7.5 mg Per Tube QHS  . multivitamin  15 mL Per Tube Daily  . pantoprazole sodium  40 mg Per Tube BID  . polyethylene glycol  17 g Per Tube Daily  . sennosides  5 mL Per Tube QHS   Continuous Infusions: . sodium chloride 50 mL/hr at 12/14/17 2217  .  ceFAZolin (ANCEF) IV  2 g (12/10/17 0905)  . feeding supplement (JEVITY 1.2 CAL) 1,000 mL  (12/14/17 1716)     LOS: 6 days     Cordelia Poche, MD [facility] 12/15/2017, 10:13 AM  If 7PM-7AM, please contact night-coverage [email or web address]

## 2017-12-15 NOTE — Progress Notes (Signed)
Physical Therapy Treatment Patient Details Name: [name] MRN: [id number] DOB: Jun 17, 1927 Today's Date: 12/15/2017    History of Present Illness 84 yo female with onset of UTI and AMS was admitted with ongoing loss of wgt and dehydration resulting from achalasia with poor result from botox in visit from 11/20/17.  Pt has FTT, has lost 50 pounds in last year.  Intake of mainly milk and is expecting to have g tube per son to restore recent I gait on RW and  I self care.  PMHx:  CAD, AKI, angina, cardiomegaly, CABG, CVA with L hemi, CKD 3, transfusion, pleural effusion, DM, achalasia with botox procedure. G-tube placement 12/10/17.     PT Comments    Pt progressing slowly towards physical therapy goals. Focus of session was bed mobility to perform peri-care and EOB balance activity for trunk stabilization. Pt was able to tolerate ~8 minutes sitting EOB with increased R lateral lean with fatigue - L side inattention contributing to this as well. SNF remains appropriate. Will continue to follow.  Follow Up Recommendations  SNF;Supervision/Assistance - 24 hour     Equipment Recommendations  None recommended by PT    Recommendations for Other Services       Precautions / Restrictions Precautions Precautions: Fall Precaution Comments: coccygeal wound, Prevalon boots/ Peg 11/10 with abdominal binder, geo mat Restrictions Weight Bearing Restrictions: No    Mobility  Bed Mobility Overal bed mobility: Needs Assistance Bed Mobility: Rolling;Sidelying to Sit Rolling: Max assist Sidelying to sit: +2 for physical assistance;Max assist Supine to sit: Total assist Sit to supine: Total assist   General bed mobility comments: Rolled R and L for pericare with less assist required to roll L. Pt attempting to assist with UE's but weakness was limiting effectiveness. +2 assist required for transition to/from EOB with total assist required.   Transfers                 General transfer  comment: We were not able to progress to OOB this session.   Ambulation/Gait                 Stairs             Wheelchair Mobility    Modified Rankin (Stroke Patients Only)       Balance Overall balance assessment: Needs assistance Sitting-balance support: Feet supported;Bilateral upper extremity supported Sitting balance-Leahy Scale: Poor Sitting balance - Comments: Posteriorly, pt required mostly min guard assist and occasional min assist. When pt began laterally leaning to the R, up to max assist was provided to return to midline. In sitting, pt participated in brief reaching activity before she fatigued.  Postural control: Posterior lean;Right lateral lean                                  Cognition Arousal/Alertness: Lethargic Behavior During Therapy: Flat affect Overall Cognitive Status: Impaired/Different from baseline Area of Impairment: Attention;Following commands;Safety/judgement;Awareness                   Current Attention Level: Sustained   Following Commands: Follows one step commands inconsistently;Follows one step commands with increased time Safety/Judgement: Decreased awareness of safety;Decreased awareness of deficits Awareness: Intellectual Problem Solving: Slow processing;Decreased initiation;Difficulty sequencing;Requires verbal cues General Comments: pt requires incr time to initiate and unable to sustain task without additional cues.       Exercises      General Comments  Pertinent Vitals/Pain Pain Assessment: Faces Faces Pain Scale: Hurts little more Pain Location: LE's L>R Pain Descriptors / Indicators: Cramping Pain Intervention(s): Limited activity within patient's tolerance;Monitored during session;Repositioned    Home Living                      Prior Function            PT Goals (current goals can now be found in the care plan section) Acute Rehab PT Goals Patient Stated Goal:  None stated this session PT Goal Formulation: With family Time For Goal Achievement: 12/20/17 Potential to Achieve Goals: Fair Progress towards PT goals: Progressing toward goals    Frequency    Min 2X/week      PT Plan Current plan remains appropriate    Co-evaluation              AM-PAC PT "6 Clicks" Daily Activity  Outcome Measure  Difficulty turning over in bed (including adjusting bedclothes, sheets and blankets)?: Unable Difficulty moving from lying on back to sitting on the side of the bed? : Unable Difficulty sitting down on and standing up from a chair with arms (e.g., wheelchair, bedside commode, etc,.)?: Unable Help needed moving to and from a bed to chair (including a wheelchair)?: Total Help needed walking in hospital room?: Total Help needed climbing 3-5 steps with a railing? : Total 6 Click Score: 6    End of Session   Activity Tolerance: Patient limited by fatigue Patient left: in bed;with call bell/phone within reach;Other (comment) Nurse Communication: Mobility status;Other (comment)(Purewick needs to be reapplied) PT Visit Diagnosis: Muscle weakness (generalized) (M62.81);Other abnormalities of gait and mobility (R26.89);Adult, failure to thrive (R62.7);Hemiplegia and hemiparesis Hemiplegia - Right/Left: Left Hemiplegia - dominant/non-dominant: Non-dominant Hemiplegia - caused by: Unspecified     Time: 2297-9892 PT Time Calculation (min) (ACUTE ONLY): 42 min  Charges:  $Therapeutic Activity: 38-52 mins                     [name], PT, DPT Acute Rehabilitation Services Pager: [phone] Office: [phone]    [name] 12/15/2017, 2:38 PM

## 2017-12-15 NOTE — Clinical Social Work Note (Signed)
CSW communicated with [facility] admissions director Melvenia Beam and was advised that they will not have a bed over the weekend as the person they thought would be discharging is not. Son contacted and informed. Son requested that CSW check into semi-private rooms at [facility], and CSW communicated with admissions director by text (she was in a conference) regarding semi-private room availability. Son brought up that patient not medically stable for discharge, however CSW explained that for discharge planning purposes, we need to know where patient is going, especially since patient has insurance [facility]) that requires pre-authorization, and son expressed understanding.  CSW waiting to hear from admissions director at [facility] and will continue to provide SW intervention services, and work with son on discharge planning for patient.   Cintya Daughety Givens, MSW, LCSW Licensed Clinical Social Worker [place] [phone]

## 2017-12-16 LAB — GLUCOSE, CAPILLARY
Glucose-Capillary: 154 mg/dL — ABNORMAL HIGH (ref 70–99)
Glucose-Capillary: 175 mg/dL — ABNORMAL HIGH (ref 70–99)
Glucose-Capillary: 181 mg/dL — ABNORMAL HIGH (ref 70–99)
Glucose-Capillary: 206 mg/dL — ABNORMAL HIGH (ref 70–99)
Glucose-Capillary: 221 mg/dL — ABNORMAL HIGH (ref 70–99)
Glucose-Capillary: 248 mg/dL — ABNORMAL HIGH (ref 70–99)

## 2017-12-16 LAB — RENAL FUNCTION PANEL
ALBUMIN: 1.5 g/dL — AB (ref 3.5–5.0)
ANION GAP: 4 — AB (ref 5–15)
BUN: 22 mg/dL (ref 8–23)
CALCIUM: 8.9 mg/dL (ref 8.9–10.3)
CO2: 26 mmol/L (ref 22–32)
Chloride: 108 mmol/L (ref 98–111)
Creatinine, Ser: 1.31 mg/dL — ABNORMAL HIGH (ref 0.44–1.00)
GFR calc Af Amer: 41 mL/min — ABNORMAL LOW (ref >60)
GFR calc non Af Amer: 35 mL/min — ABNORMAL LOW (ref >60)
Glucose, Bld: 225 mg/dL — ABNORMAL HIGH (ref 70–99)
PHOSPHORUS: 2.2 mg/dL — AB (ref 2.5–4.6)
Potassium: 4.2 mmol/L (ref 3.5–5.1)
SODIUM: 138 mmol/L (ref 135–145)

## 2017-12-16 LAB — CBC
HCT: 25.7 % — ABNORMAL LOW (ref 36.0–46.0)
Hemoglobin: 7.6 g/dL — ABNORMAL LOW (ref 12.0–15.0)
MCH: 27.2 pg (ref 26.0–34.0)
MCHC: 29.6 g/dL — ABNORMAL LOW (ref 30.0–36.0)
MCV: 92.1 fL (ref 80.0–100.0)
NRBC: 0 % (ref 0.0–0.2)
PLATELETS: 269 10*3/uL (ref 150–400)
RBC: 2.79 MIL/uL — AB (ref 3.87–5.11)
RDW: 18.3 % — ABNORMAL HIGH (ref 11.5–15.5)
WBC: 8.4 10*3/uL (ref 4.0–10.5)

## 2017-12-16 MED ORDER — INSULIN GLARGINE 100 UNIT/ML ~~LOC~~ SOLN
20.0000 [IU] | Freq: Every day | SUBCUTANEOUS | Status: DC
  Administered 2017-12-16 – 2017-12-25 (×10): 20 [IU] via SUBCUTANEOUS
  Filled 2017-12-16 (×10): qty 0.2

## 2017-12-16 MED ORDER — ONDANSETRON HCL 4 MG/2ML IJ SOLN
4.0000 mg | Freq: Three times a day (TID) | INTRAMUSCULAR | Status: DC | PRN
  Administered 2017-12-16 – 2018-02-20 (×19): 4 mg via INTRAVENOUS
  Filled 2017-12-16 (×18): qty 2

## 2017-12-16 NOTE — Progress Notes (Signed)
PROGRESS NOTE    [name]  RCV:[id number] DOB: 1927/06/24 DOA: 12/05/2017 PCP: Deland Pretty, MD   Brief Narrative: [name] is a 84 y.o. femalewith medical history significant forDM2, HTN, HLD, CAD s/p CABG, h/o CVA, CKD3. She presented secondary to dysarthria. MRI significant for no stroke. Also with failure to thrive with PEG tube placed. Working up confusion.    Assessment & Plan:   Principal Problem:   Dysarthria Active Problems:   Diabetes mellitus ([place])   Hyperlipidemia   Essential hypertension   Coronary atherosclerosis   Stroke (cerebrum) ([place])   Stage 3 chronic kidney disease ([place])   Acute lower UTI   Adult failure to thrive   UTI (urinary tract infection)   DNR (do not resuscitate) discussion   Palliative care by specialist   Dysarthria Appears to be improved.  Metabolic encephalopathy Unknown etiology but possibly related to significant dehydration vs ?hypercalcemia vs infection vs malnutrition. UTI treated with little improvement. Also possibly related to failure to thrive. MRI brain without acute process but did show chronic advance atherosclerotic disease. Patient is very different (decreased) from my last encounter about one month ago with regard to mental status. Urinalysis suggests possible inadequate treatment. Ionized calcium mildly elevated. Repeat urine culture with no growth. Improved.  History of stroke CAD -Continue Lipitor  Essential hypertension -metoprolol -Imdur (switched to immediate release per tube)  CKD stage 3 Stable  Failure to thrive Patient is s/p PEG tube. Currently on tube feeding. TPN off. -Continue tube feeding -SSI q4 hours -Increase to Lantus 20 units and continue Novolog 5 units q4 hours  Decreased urine output Unsure if this is true oliguria vs incontinence. Appears to be related to incontinence. Urine culture with no growth. -Strict in and out  Enterococcus UTI Patient is s/p ampicillin. Treated. Repeat  urine culture with no growth  DVT prophylaxis Concern about subcutaneous heparin causing possible bleeding. No significant active bleeding. Added SCDs, but patient is high risk for DVT. Unless source can be identified, would recommend continued heparin subq.  Decreased breath sounds Asymptomatic per patient. Chest x-ray unremarkable.   Pressure Injury Documentation: Pressure Injury 11/04/17 Stage II -  Partial thickness loss of dermis presenting as a shallow open ulcer with a red, pink wound bed without slough. (Active)  11/04/17 1530   Location: Sacrum  Location Orientation: Medial  Staging: Stage II -  Partial thickness loss of dermis presenting as a shallow open ulcer with a red, pink wound bed without slough.  Wound Description (Comments):   Present on Admission: Yes     Pressure Injury 11/04/17 Stage II -  Partial thickness loss of dermis presenting as a shallow open ulcer with a red, pink wound bed without slough. (Active)  11/04/17 1530   Location: Sacrum  Location Orientation: Medial  Staging: Stage II -  Partial thickness loss of dermis presenting as a shallow open ulcer with a red, pink wound bed without slough.  Wound Description (Comments):   Present on Admission: Yes     DVT prophylaxis: Heparin subq, SCDs Code Status:   Code Status: Full Code Family Communication: None at bedside Disposition Plan: Discharge to SNF pending further workup for confusion, possibly oliguria   Consultants:   None  Procedures:   11/10: PEG tube placement  Antimicrobials:  Ceftriaxone (11/5>>11/8)  Cefazolin (11/10)  Ampicillin (11/8>>11/12)   Subjective: No issues overnight. Per report, sleep interrupted overnight.  Objective: Vitals:   12/15/17 1720 12/15/17 2014 12/16/17 0435 12/16/17 0927  BP: Marland Kitchen)  146/61 (!) 142/58 (!) 136/59 (!) 142/66  Pulse: 82 86 86 87  Resp: 18 16 18    Temp: 97.7 F (36.5 C) 98.2 F (36.8 C) 98.6 F (37 C) 98.4 F (36.9 C)  TempSrc: Oral  Oral  Oral  SpO2: 100% 100% 100% 98%  Weight:        Intake/Output Summary (Last 24 hours) at 12/16/2017 1219 Last data filed at 12/16/2017 1517 Gross per 24 hour  Intake 1874.94 ml  Output 1 ml  Net 1873.94 ml   Filed Weights   12/12/17 0500 12/14/17 0500 12/14/17 1936  Weight: 46.6 kg 53.1 kg 54 kg    Examination:  General exam: Appears calm and comfortable Respiratory system: Diminished but clear to auscultation. Respiratory effort normal. Cardiovascular system: S1 & S2 heard, RRR. No murmurs, rubs, gallops or clicks. Gastrointestinal system: Abdomen is nondistended, soft and nontender. No organomegaly or masses felt. Normal bowel sounds heard. Central nervous system: Somnolent but easily arouses. Oriented to person. Follows commands.  Extremities: No edema. No calf tenderness Skin: No cyanosis. No rashes Psychiatry: Flat affect    Data Reviewed: I have personally reviewed following labs and imaging studies  CBC: Recent Labs  Lab 12/11/17 1148 12/12/17 0313 12/13/17 1749 12/14/17 1237 12/15/17 0423  WBC 6.3 10.0 8.6 9.2 9.9  HGB 7.0* 9.8* 8.7* 8.1* 8.0*  HCT 23.0* 30.9* 27.8* 26.2* 25.9*  MCV 90.2 87.5 88.5 89.4 90.6  PLT 275 317 246 236 616   Basic Metabolic Panel: Recent Labs  Lab 12/11/17 1148 12/11/17 1729 12/12/17 0313 12/12/17 1759 12/13/17 1749 12/14/17 0958 12/15/17 0423  NA 136  --  133*  --  134* 134* 136  K 3.4*  --  4.4  --  4.7 4.0 4.1  CL 108  --  105  --  103 106 106  CO2 24  --  22  --  25 24 25   GLUCOSE 212*  --  295*  --  482* 256* 266*  BUN 10  --  15  --  22 21 22   CREATININE 1.10*  --  1.15*  --  1.29* 1.22* 1.32*  CALCIUM 9.1  --  9.2  --  9.1 8.4* 8.4*  MG 1.9 2.0 2.0 2.0  --  1.8  --   PHOS 2.5 2.5 1.8* 4.4 2.1* 1.8* 1.9*   GFR: Estimated Creatinine Clearance: 24.4 mL/min (A) (by C-G formula based on SCr of 1.32 mg/dL (H)). Liver Function Tests: Recent Labs  Lab 12/10/17 0358 12/11/17 1148 12/13/17 1749  12/14/17 0958 12/15/17 0423  AST 11* 12*  --   --   --   ALT 7 <5  --   --   --   ALKPHOS 44 42  --   --   --   BILITOT 0.7 0.3  --   --   --   PROT 6.2* 5.5*  --   --   --   ALBUMIN 2.0* 1.6* 1.6* 1.5* 1.5*   No results for input(s): LIPASE, AMYLASE in the last 168 hours. No results for input(s): AMMONIA in the last 168 hours. Coagulation Profile: No results for input(s): INR, PROTIME in the last 168 hours. Cardiac Enzymes: No results for input(s): CKTOTAL, CKMB, CKMBINDEX, TROPONINI in the last 168 hours. BNP (last 3 results) No results for input(s): PROBNP in the last 8760 hours. HbA1C: No results for input(s): HGBA1C in the last 72 hours. CBG: Recent Labs  Lab 12/15/17 1635 12/15/17 2011 12/15/17 2336 12/16/17 0421 12/16/17  [place]*   Lipid Profile: No results for input(s): CHOL, HDL, LDLCALC, TRIG, CHOLHDL, LDLDIRECT in the last 72 hours. Thyroid Function Tests: No results for input(s): TSH, T4TOTAL, FREET4, T3FREE, THYROIDAB in the last 72 hours. Anemia Panel: No results for input(s): VITAMINB12, FOLATE, FERRITIN, TIBC, IRON, RETICCTPCT in the last 72 hours. Sepsis Labs: No results for input(s): PROCALCITON, LATICACIDVEN in the last 168 hours.  Recent Results (from the past 240 hour(s))  Culture, Urine     Status: None   Collection Time: 12/13/17  6:19 PM  Result Value Ref Range Status   Specimen Description URINE, CLEAN CATCH  Final   Special Requests NONE  Final   Culture   Final    NO GROWTH Performed at [facility], [address] [place], [place] [address]    Report Status 12/14/2017 FINAL  Final         Radiology Studies: Dg Chest Port 1 View  Result Date: 12/15/2017 CLINICAL DATA:  Decreased breath sounds at left lung base,hx htn,dm,cad,cardiomyopathy EXAM: PORTABLE CHEST 1 VIEW COMPARISON:  Chest x-rays dated 12/05/2017, 10/06/2016 and 05/14/2010. FINDINGS: Heart size and mediastinal contours are stable.  New RIGHT-sided PICC line in place with tip at the level of the lower SVC/cavoatrial junction. Lungs appear clear. No pleural effusion or pneumothorax seen. Median sternotomy wires appear intact and stable in alignment. No acute or suspicious osseous finding. IMPRESSION: No active disease. No evidence of pneumonia or pulmonary edema. RIGHT-sided PICC line appears well positioned with tip at the level of the lower SVC/cavoatrial junction. Electronically Signed   By: Franki Cabot M.D.   On: 12/15/2017 11:10        Scheduled Meds: . sodium chloride   Intravenous Once  . atorvastatin  40 mg Per Tube q1800  . cholecalciferol  400 Units Per Tube Daily  . ferrous sulfate  300 mg Per Tube Daily  . free water  100 mL Per Tube Q8H  . heparin injection (subcutaneous)  5,000 Units Subcutaneous Q8H  . insulin aspart  0-15 Units Subcutaneous Q4H  . insulin aspart  5 Units Subcutaneous Q4H  . insulin glargine  15 Units Subcutaneous QHS  . isosorbide dinitrate  10 mg Per Tube BID  . megestrol  400 mg Oral Daily  . metoprolol tartrate  75 mg Oral BID  . mirtazapine  7.5 mg Per Tube QHS  . multivitamin  15 mL Per Tube Daily  . pantoprazole sodium  40 mg Per Tube BID  . polyethylene glycol  17 g Per Tube Daily  . sennosides  5 mL Per Tube QHS   Continuous Infusions: . sodium chloride 50 mL/hr at 12/15/17 1656  .  ceFAZolin (ANCEF) IV 2 g (12/10/17 0905)  . feeding supplement (JEVITY 1.2 CAL) 1,000 mL (12/16/17 1028)     LOS: 7 days     Cordelia Poche, MD [facility] 12/16/2017, 12:19 PM  If 7PM-7AM, please contact night-coverage [email or web address]

## 2017-12-17 LAB — GLUCOSE, CAPILLARY
Glucose-Capillary: 153 mg/dL — ABNORMAL HIGH (ref 70–99)
Glucose-Capillary: 119 mg/dL — ABNORMAL HIGH (ref 70–99)
Glucose-Capillary: 89 mg/dL (ref 70–99)
Glucose-Capillary: 181 mg/dL — ABNORMAL HIGH (ref 70–99)
Glucose-Capillary: 156 mg/dL — ABNORMAL HIGH (ref 70–99)

## 2017-12-17 MED ORDER — LOPERAMIDE HCL 1 MG/7.5ML PO SUSP
2.0000 mg | ORAL | Status: DC | PRN
  Administered 2017-12-17 – 2018-01-05 (×2): 2 mg via ORAL
  Filled 2017-12-17 (×4): qty 15

## 2017-12-17 NOTE — Progress Notes (Signed)
PROGRESS NOTE    [name]  HUT:[id number] DOB: 03-24-1927 DOA: 12/05/2017 PCP: Deland Pretty, MD   Brief Narrative: [name] is a 84 y.o. femalewith medical history significant forDM2, HTN, HLD, CAD s/p CABG, h/o CVA, CKD3. She presented secondary to dysarthria. MRI significant for no stroke. Also with failure to thrive with PEG tube placed. Working up confusion.    Assessment & Plan:   Principal Problem:   Dysarthria Active Problems:   Diabetes mellitus ([place])   Hyperlipidemia   Essential hypertension   Coronary atherosclerosis   Stroke (cerebrum) ([place])   Stage 3 chronic kidney disease ([place])   Acute lower UTI   Adult failure to thrive   UTI (urinary tract infection)   DNR (do not resuscitate) discussion   Palliative care by specialist   Dysarthria Appears to be improved.  Metabolic encephalopathy Unknown etiology but possibly related to significant dehydration vs ?hypercalcemia vs infection vs malnutrition. UTI treated with little improvement. Also possibly related to failure to thrive. MRI brain without acute process but did show chronic advance atherosclerotic disease. Patient is very different (decreased) from my last encounter about one month ago with regard to mental status. Urinalysis suggests possible inadequate treatment. Ionized calcium mildly elevated. Repeat urine culture with no growth. Improved.  History of stroke CAD -Continue Lipitor  Essential hypertension -metoprolol -Imdur (switched to immediate release per tube)  CKD stage 3 Stable  Failure to thrive Patient is s/p PEG tube. Currently on tube feeding. TPN off. -Continue tube feeding -SSI q4 hours -Increase to Lantus 20 units and continue Novolog 5 units q4 hours -imodium prn for diarrhea  Decreased urine output Unsure if this is true oliguria vs incontinence. Appears to be related to incontinence. Urine culture with no growth. -Strict in and out  Enterococcus UTI Patient is s/p  ampicillin. Treated. Repeat urine culture with no growth  DVT prophylaxis Concern about subcutaneous heparin causing possible bleeding. No significant active bleeding. Added SCDs, but patient is high risk for DVT. Unless source can be identified, would recommend continued heparin subq.  Decreased breath sounds Asymptomatic per patient. Chest x-ray unremarkable.   Pressure Injury Documentation: Pressure Injury 11/04/17 Stage II -  Partial thickness loss of dermis presenting as a shallow open ulcer with a red, pink wound bed without slough. (Active)  11/04/17 1530   Location: Sacrum  Location Orientation: Medial  Staging: Stage II -  Partial thickness loss of dermis presenting as a shallow open ulcer with a red, pink wound bed without slough.  Wound Description (Comments):   Present on Admission: Yes     Pressure Injury 11/04/17 Stage II -  Partial thickness loss of dermis presenting as a shallow open ulcer with a red, pink wound bed without slough. (Active)  11/04/17 1530   Location: Sacrum  Location Orientation: Medial  Staging: Stage II -  Partial thickness loss of dermis presenting as a shallow open ulcer with a red, pink wound bed without slough.  Wound Description (Comments):   Present on Admission: Yes     DVT prophylaxis: Heparin subq, SCDs Code Status:   Code Status: Full Code Family Communication: None at bedside Disposition Plan: Discharge to SNF when bed available   Consultants:   None  Procedures:   11/10: PEG tube placement  Antimicrobials:  Ceftriaxone (11/5>>11/8)  Cefazolin (11/10)  Ampicillin (11/8>>11/12)   Subjective: Diarrhea overnight.  Objective: Vitals:   12/16/17 1639 12/16/17 2030 12/17/17 0430 12/17/17 0915  BP: 138/60 140/68 133/70 (!) 149/69  Pulse:  89 80 87 83  Resp: 18 18 18 18   Temp: 98 F (36.7 C) 98.5 F (36.9 C) 98 F (36.7 C) 98.7 F (37.1 C)  TempSrc: Oral Oral Oral Oral  SpO2: 98% 98% 98% 100%  Weight:         Intake/Output Summary (Last 24 hours) at 12/17/2017 1259 Last data filed at 12/17/2017 0935 Gross per 24 hour  Intake 2089.45 ml  Output 350 ml  Net 1739.45 ml   Filed Weights   12/12/17 0500 12/14/17 0500 12/14/17 1936  Weight: 46.6 kg 53.1 kg 54 kg    Examination:  General exam: Appears calm and comfortable Respiratory system: Clear to auscultation. Respiratory effort normal. Cardiovascular system: S1 & S2 heard, RRR. No murmurs, rubs, gallops or clicks. Gastrointestinal system: Abdomen is nondistended, soft and nontender. No organomegaly or masses felt. Normal bowel sounds heard. Central nervous system: Alert and oriented to person. Follows commands Extremities: No edema. No calf tenderness Skin: No cyanosis. No rashes    Data Reviewed: I have personally reviewed following labs and imaging studies  CBC: Recent Labs  Lab 12/12/17 0313 12/13/17 1749 12/14/17 1237 12/15/17 0423 12/16/17 1419  WBC 10.0 8.6 9.2 9.9 8.4  HGB 9.8* 8.7* 8.1* 8.0* 7.6*  HCT 30.9* 27.8* 26.2* 25.9* 25.7*  MCV 87.5 88.5 89.4 90.6 92.1  PLT 317 246 236 238 546   Basic Metabolic Panel: Recent Labs  Lab 12/11/17 1148 12/11/17 1729 12/12/17 0313 12/12/17 1759 12/13/17 1749 12/14/17 0958 12/15/17 0423 12/16/17 1419  NA 136  --  133*  --  134* 134* 136 138  K 3.4*  --  4.4  --  4.7 4.0 4.1 4.2  CL 108  --  105  --  103 106 106 108  CO2 24  --  22  --  25 24 25 26   GLUCOSE 212*  --  295*  --  482* 256* 266* 225*  BUN 10  --  15  --  22 21 22 22   CREATININE 1.10*  --  1.15*  --  1.29* 1.22* 1.32* 1.31*  CALCIUM 9.1  --  9.2  --  9.1 8.4* 8.4* 8.9  MG 1.9 2.0 2.0 2.0  --  1.8  --   --   PHOS 2.5 2.5 1.8* 4.4 2.1* 1.8* 1.9* 2.2*   GFR: Estimated Creatinine Clearance: 24.6 mL/min (A) (by C-G formula based on SCr of 1.31 mg/dL (H)). Liver Function Tests: Recent Labs  Lab 12/11/17 1148 12/13/17 1749 12/14/17 0958 12/15/17 0423 12/16/17 1419  AST 12*  --   --   --   --   ALT  <5  --   --   --   --   ALKPHOS 42  --   --   --   --   BILITOT 0.3  --   --   --   --   PROT 5.5*  --   --   --   --   ALBUMIN 1.6* 1.6* 1.5* 1.5* 1.5*   No results for input(s): LIPASE, AMYLASE in the last 168 hours. No results for input(s): AMMONIA in the last 168 hours. Coagulation Profile: No results for input(s): INR, PROTIME in the last 168 hours. Cardiac Enzymes: No results for input(s): CKTOTAL, CKMB, CKMBINDEX, TROPONINI in the last 168 hours. BNP (last 3 results) No results for input(s): PROBNP in the last 8760 hours. HbA1C: No results for input(s): HGBA1C in the last 72 hours. CBG: Recent Labs  Lab 12/16/17 2031  12/16/17 2349 12/17/17 0440 12/17/17 0730 12/17/17 1121  GLUCAP 181* 206* 153* 119* 89   Lipid Profile: No results for input(s): CHOL, HDL, LDLCALC, TRIG, CHOLHDL, LDLDIRECT in the last 72 hours. Thyroid Function Tests: No results for input(s): TSH, T4TOTAL, FREET4, T3FREE, THYROIDAB in the last 72 hours. Anemia Panel: No results for input(s): VITAMINB12, FOLATE, FERRITIN, TIBC, IRON, RETICCTPCT in the last 72 hours. Sepsis Labs: No results for input(s): PROCALCITON, LATICACIDVEN in the last 168 hours.  Recent Results (from the past 240 hour(s))  Culture, Urine     Status: None   Collection Time: 12/13/17  6:19 PM  Result Value Ref Range Status   Specimen Description URINE, CLEAN CATCH  Final   Special Requests NONE  Final   Culture   Final    NO GROWTH Performed at [facility], [address], [place], [place] [address]    Report Status 12/14/2017 FINAL  Final         Radiology Studies: No results found.      Scheduled Meds: . sodium chloride   Intravenous Once  . atorvastatin  40 mg Per Tube q1800  . cholecalciferol  400 Units Per Tube Daily  . ferrous sulfate  300 mg Per Tube Daily  . free water  100 mL Per Tube Q8H  . heparin injection (subcutaneous)  5,000 Units Subcutaneous Q8H  . insulin aspart  0-15 Units  Subcutaneous Q4H  . insulin aspart  5 Units Subcutaneous Q4H  . insulin glargine  20 Units Subcutaneous QHS  . isosorbide dinitrate  10 mg Per Tube BID  . megestrol  400 mg Oral Daily  . metoprolol tartrate  75 mg Oral BID  . mirtazapine  7.5 mg Per Tube QHS  . multivitamin  15 mL Per Tube Daily  . pantoprazole sodium  40 mg Per Tube BID  . polyethylene glycol  17 g Per Tube Daily  . sennosides  5 mL Per Tube QHS   Continuous Infusions: . sodium chloride 50 mL/hr at 12/16/17 1450  .  ceFAZolin (ANCEF) IV 2 g (12/10/17 0905)  . feeding supplement (JEVITY 1.2 CAL) 1,000 mL (12/17/17 0621)     LOS: 8 days     Cordelia Poche, MD [facility] 12/17/2017, 12:59 PM  If 7PM-7AM, please contact night-coverage [email or web address]

## 2017-12-18 LAB — CBC
HEMATOCRIT: 23.6 % — AB (ref 36.0–46.0)
HEMOGLOBIN: 7.1 g/dL — AB (ref 12.0–15.0)
MCH: 27.7 pg (ref 26.0–34.0)
MCHC: 30.1 g/dL (ref 30.0–36.0)
MCV: 92.2 fL (ref 80.0–100.0)
NRBC: 0 % (ref 0.0–0.2)
Platelets: 255 10*3/uL (ref 150–400)
RBC: 2.56 MIL/uL — ABNORMAL LOW (ref 3.87–5.11)
RDW: 18.6 % — ABNORMAL HIGH (ref 11.5–15.5)
WBC: 10.8 10*3/uL — ABNORMAL HIGH (ref 4.0–10.5)

## 2017-12-18 LAB — GLUCOSE, CAPILLARY
Glucose-Capillary: 143 mg/dL — ABNORMAL HIGH (ref 70–99)
Glucose-Capillary: 150 mg/dL — ABNORMAL HIGH (ref 70–99)
Glucose-Capillary: 159 mg/dL — ABNORMAL HIGH (ref 70–99)
Glucose-Capillary: 166 mg/dL — ABNORMAL HIGH (ref 70–99)
Glucose-Capillary: 204 mg/dL — ABNORMAL HIGH (ref 70–99)
Glucose-Capillary: 223 mg/dL — ABNORMAL HIGH (ref 70–99)

## 2017-12-18 NOTE — Care Management Important Message (Signed)
Important Message  Patient Details  Name: [name] MRN: [id number] Date of Birth: 27-Nov-1927   Medicare Important Message Given:  Yes(Discussed with son POA)    Maryclare Labrador, RN 12/18/2017, 5:20 PM

## 2017-12-18 NOTE — Telephone Encounter (Signed)
Was never able to reach patient and she is back in the hospital.

## 2017-12-18 NOTE — Progress Notes (Signed)
PROGRESS NOTE    [name]  OHY:[id number] DOB: 01-20-1928 DOA: 12/05/2017 PCP: Deland Pretty, MD   Brief Narrative: [name] is a 84 y.o. femalewith medical history significant forDM2, HTN, HLD, CAD s/p CABG, h/o CVA, CKD3. She presented secondary to dysarthria. MRI significant for no stroke. Also with failure to thrive with PEG tube placed. Working up confusion.    Assessment & Plan:   Principal Problem:   Dysarthria Active Problems:   Diabetes mellitus ([place])   Hyperlipidemia   Essential hypertension   Coronary atherosclerosis   Stroke (cerebrum) ([place])   Stage 3 chronic kidney disease ([place])   Acute lower UTI   Adult failure to thrive   UTI (urinary tract infection)   DNR (do not resuscitate) discussion   Palliative care by specialist   Dysarthria Appears to be improved.  Metabolic encephalopathy Unknown etiology but possibly related to significant dehydration vs ?hypercalcemia vs infection vs malnutrition. UTI treated with little improvement. Also possibly related to failure to thrive. MRI brain without acute process but did show chronic advance atherosclerotic disease. Patient is very different (decreased) from my last encounter about one month ago with regard to mental status. Urinalysis suggests possible inadequate treatment. Ionized calcium mildly elevated. Repeat urine culture with no growth. Improving. Not at baseline.  History of stroke CAD -Continue Lipitor  Essential hypertension -metoprolol -Imdur (switched to immediate release per tube)  CKD stage 3 Stable  Failure to thrive Patient is s/p PEG tube. Currently on tube feeding. TPN off. -Continue tube feeding -SSI q4 hours -Increase to Lantus 20 units and continue Novolog 5 units q4 hours -imodium prn for diarrhea  Decreased urine output Unsure if this is true oliguria vs incontinence. Appears to be related to incontinence. Urine culture with no growth. -Strict in and out  Enterococcus  UTI Patient is s/p ampicillin. Treated. Repeat urine culture with no growth  DVT prophylaxis Concern about subcutaneous heparin causing possible bleeding. No significant active bleeding. Added SCDs, but patient is high risk for DVT. Unless source can be identified, would recommend continued heparin subq.  Decreased breath sounds Asymptomatic per patient. Chest x-ray unremarkable.   Pressure Injury Documentation: Pressure Injury 11/04/17 Stage II -  Partial thickness loss of dermis presenting as a shallow open ulcer with a red, pink wound bed without slough. (Active)  11/04/17 1530   Location: Sacrum  Location Orientation: Medial  Staging: Stage II -  Partial thickness loss of dermis presenting as a shallow open ulcer with a red, pink wound bed without slough.  Wound Description (Comments):   Present on Admission: Yes     Pressure Injury 11/04/17 Stage II -  Partial thickness loss of dermis presenting as a shallow open ulcer with a red, pink wound bed without slough. (Active)  11/04/17 1530   Location: Sacrum  Location Orientation: Medial  Staging: Stage II -  Partial thickness loss of dermis presenting as a shallow open ulcer with a red, pink wound bed without slough.  Wound Description (Comments):   Present on Admission: Yes     DVT prophylaxis: Heparin subq, SCDs Code Status:   Code Status: Full Code Family Communication: Son at bedside Disposition Plan: Discharge to SNF when bed available   Consultants:   None  Procedures:   11/10: PEG tube placement  Antimicrobials:  Ceftriaxone (11/5>>11/8)  Cefazolin (11/10)  Ampicillin (11/8>>11/12)   Subjective: Diarrhea has slowed down.  Objective: Vitals:   12/17/17 1713 12/17/17 2023 12/18/17 0415 12/18/17 0854  BP: (!) 141/63 Marland Kitchen)  156/80 (!) 164/96 (!) 152/83  Pulse: 83 85 95 91  Resp: 18 18 18 18   Temp: 98.6 F (37 C) 98 F (36.7 C) 97.7 F (36.5 C) 98 F (36.7 C)  TempSrc: Oral   Oral  SpO2: 100% 100%  94% 97%  Weight:  54 kg      Intake/Output Summary (Last 24 hours) at 12/18/2017 1121 Last data filed at 12/18/2017 0924 Gross per 24 hour  Intake 2255.72 ml  Output 0 ml  Net 2255.72 ml   Filed Weights   12/14/17 0500 12/14/17 1936 12/17/17 2023  Weight: 53.1 kg 54 kg 54 kg    Examination:  General exam: Appears calm and comfortable Respiratory system: Clear to auscultation. Respiratory effort normal. Cardiovascular system: S1 & S2 heard, RRR. No murmurs, rubs, gallops or clicks. Gastrointestinal system: Abdomen is nondistended, soft and nontender. No organomegaly or masses felt. Normal bowel sounds heard. Central nervous system: Alert and oriented to person. Extremities: No edema. No calf tenderness Skin: No cyanosis. No rashes    Data Reviewed: I have personally reviewed following labs and imaging studies  CBC: Recent Labs  Lab 12/12/17 0313 12/13/17 1749 12/14/17 1237 12/15/17 0423 12/16/17 1419  WBC 10.0 8.6 9.2 9.9 8.4  HGB 9.8* 8.7* 8.1* 8.0* 7.6*  HCT 30.9* 27.8* 26.2* 25.9* 25.7*  MCV 87.5 88.5 89.4 90.6 92.1  PLT 317 246 236 238 161   Basic Metabolic Panel: Recent Labs  Lab 12/11/17 1148 12/11/17 1729 12/12/17 0313 12/12/17 1759 12/13/17 1749 12/14/17 0958 12/15/17 0423 12/16/17 1419  NA 136  --  133*  --  134* 134* 136 138  K 3.4*  --  4.4  --  4.7 4.0 4.1 4.2  CL 108  --  105  --  103 106 106 108  CO2 24  --  22  --  25 24 25 26   GLUCOSE 212*  --  295*  --  482* 256* 266* 225*  BUN 10  --  15  --  22 21 22 22   CREATININE 1.10*  --  1.15*  --  1.29* 1.22* 1.32* 1.31*  CALCIUM 9.1  --  9.2  --  9.1 8.4* 8.4* 8.9  MG 1.9 2.0 2.0 2.0  --  1.8  --   --   PHOS 2.5 2.5 1.8* 4.4 2.1* 1.8* 1.9* 2.2*   GFR: Estimated Creatinine Clearance: 24.6 mL/min (A) (by C-G formula based on SCr of 1.31 mg/dL (H)). Liver Function Tests: Recent Labs  Lab 12/11/17 1148 12/13/17 1749 12/14/17 0958 12/15/17 0423 12/16/17 1419  AST 12*  --   --   --   --    ALT <5  --   --   --   --   ALKPHOS 42  --   --   --   --   BILITOT 0.3  --   --   --   --   PROT 5.5*  --   --   --   --   ALBUMIN 1.6* 1.6* 1.5* 1.5* 1.5*   No results for input(s): LIPASE, AMYLASE in the last 168 hours. No results for input(s): AMMONIA in the last 168 hours. Coagulation Profile: No results for input(s): INR, PROTIME in the last 168 hours. Cardiac Enzymes: No results for input(s): CKTOTAL, CKMB, CKMBINDEX, TROPONINI in the last 168 hours. BNP (last 3 results) No results for input(s): PROBNP in the last 8760 hours. HbA1C: No results for input(s): HGBA1C in the last 72 hours. CBG:  Recent Labs  Lab 12/17/17 1622 12/17/17 2023 12/18/17 0002 12/18/17 0415 12/18/17 0855  GLUCAP 181* 156* 150* 143* 159*   Lipid Profile: No results for input(s): CHOL, HDL, LDLCALC, TRIG, CHOLHDL, LDLDIRECT in the last 72 hours. Thyroid Function Tests: No results for input(s): TSH, T4TOTAL, FREET4, T3FREE, THYROIDAB in the last 72 hours. Anemia Panel: No results for input(s): VITAMINB12, FOLATE, FERRITIN, TIBC, IRON, RETICCTPCT in the last 72 hours. Sepsis Labs: No results for input(s): PROCALCITON, LATICACIDVEN in the last 168 hours.  Recent Results (from the past 240 hour(s))  Culture, Urine     Status: None   Collection Time: 12/13/17  6:19 PM  Result Value Ref Range Status   Specimen Description URINE, CLEAN CATCH  Final   Special Requests NONE  Final   Culture   Final    NO GROWTH Performed at [facility], [address], [place], [place] [address]    Report Status 12/14/2017 FINAL  Final         Radiology Studies: No results found.      Scheduled Meds: . sodium chloride   Intravenous Once  . atorvastatin  40 mg Per Tube q1800  . cholecalciferol  400 Units Per Tube Daily  . ferrous sulfate  300 mg Per Tube Daily  . free water  100 mL Per Tube Q8H  . heparin injection (subcutaneous)  5,000 Units Subcutaneous Q8H  . insulin aspart  0-15 Units  Subcutaneous Q4H  . insulin aspart  5 Units Subcutaneous Q4H  . insulin glargine  20 Units Subcutaneous QHS  . isosorbide dinitrate  10 mg Per Tube BID  . megestrol  400 mg Oral Daily  . metoprolol tartrate  75 mg Oral BID  . mirtazapine  7.5 mg Per Tube QHS  . multivitamin  15 mL Per Tube Daily  . pantoprazole sodium  40 mg Per Tube BID  . sennosides  5 mL Per Tube QHS   Continuous Infusions: . sodium chloride 50 mL/hr at 12/16/17 1450  . feeding supplement (JEVITY 1.2 CAL) 55 mL/hr at 12/17/17 1400     LOS: 9 days     Cordelia Poche, MD [facility] 12/18/2017, 11:21 AM  If 7PM-7AM, please contact night-coverage [email or web address]

## 2017-12-18 NOTE — Care Management Note (Addendum)
Case Management Note  Patient Details  Name: [name] MRN: [id number] Date of Birth: 09/02/1927  Subjective/Objective:   Pt readmitted with AMS                 Action/Plan:  PTA from [facility] - plan will be to return to [facility] at discharge   Expected Discharge Date:                  Expected Discharge Plan:  Skilled Nursing Facility(From [facility])  In-House Referral:  Clinical Social Work  Discharge planning Services     Post Acute Care Choice:    Choice offered to:     DME Arranged:    DME Agency:     HH Arranged:    [place] Agency:     Status of Service:  In process, will continue to follow  If discussed at Long Length of Stay Meetings, dates discussed:    Additional Comments: 12/18/2017  Pt medically ready for discharge per attending.  PTA pt resided at [facility] does not have a bed currently for pt.  Pts son refusing to place mother at another SNF, son wants to take pt home with home health.  CM spoke with pts son in depth regarding the type of care pt will require including tube feed and wound care  Son acknowledged needs and chose [facility] for [facility] and DME.  Son informed CM that he will provide 24 hour care for pt at discharge and he will hire CMA if needed.  [facility] contacted, the following equipment has been ordered and scheduled for delivery 11/19;, hospital bed.  Home health has been referred to [facility] and agency accepted [facility], PT, OT, SW and aide).  Discharge home planned for 11/19.  Pt will require ambulance transport home  12/12/17 Peg placed 12/10/17.  Pt currently receiving TPN and will continue to do so until pt is able to tolerate tube feeds. Maryclare Labrador, RN 12/18/2017, 3:46 PM

## 2017-12-18 NOTE — Clinical Social Work Note (Signed)
CSW talked with son this morning regarding SNF placement. Prior to talking with son, contact had been made with admissions director Melvenia Beam at [facility] regarding bed availability and CSW was informed (via text) that no female bed available. CSW talked with son at the bedside regarding no female bed availability at [facility] and his discharge plan for patient. Dr. Mayer Masker requested to talk with nurse case manager regarding [facility] services and a bed. When asked who would be with patient as Mr. Ullmer is a practicing doctor, he did not answer the question. CSW will continue to follow and contact [facility] on Tuesday to determine if they have any female rehab beds.  Domonik Levario Givens, MSW, LCSW Licensed Clinical Social Worker [place] [phone]

## 2017-12-19 ENCOUNTER — Inpatient Hospital Stay (HOSPITAL_COMMUNITY): Payer: Medicare Other

## 2017-12-19 DIAGNOSIS — R229 Localized swelling, mass and lump, unspecified: Secondary | ICD-10-CM

## 2017-12-19 LAB — BILIRUBIN, FRACTIONATED(TOT/DIR/INDIR)
BILIRUBIN DIRECT: 0.1 mg/dL (ref 0.0–0.2)
BILIRUBIN TOTAL: 0.5 mg/dL (ref 0.3–1.2)
Indirect Bilirubin: 0.4 mg/dL (ref 0.3–0.9)

## 2017-12-19 LAB — RENAL FUNCTION PANEL
ALBUMIN: 1.5 g/dL — AB (ref 3.5–5.0)
Anion gap: 6 (ref 5–15)
BUN: 22 mg/dL (ref 8–23)
CALCIUM: 8.7 mg/dL — AB (ref 8.9–10.3)
CO2: 24 mmol/L (ref 22–32)
Chloride: 108 mmol/L (ref 98–111)
Creatinine, Ser: 1.34 mg/dL — ABNORMAL HIGH (ref 0.44–1.00)
GFR, EST AFRICAN AMERICAN: 40 mL/min — AB (ref >60)
GFR, EST NON AFRICAN AMERICAN: 34 mL/min — AB (ref >60)
GLUCOSE: 172 mg/dL — AB (ref 70–99)
POTASSIUM: 4.5 mmol/L (ref 3.5–5.1)
Phosphorus: 3.2 mg/dL (ref 2.5–4.6)
SODIUM: 138 mmol/L (ref 135–145)

## 2017-12-19 LAB — CBC
HEMATOCRIT: 24.3 % — AB (ref 36.0–46.0)
HEMOGLOBIN: 7.6 g/dL — AB (ref 12.0–15.0)
MCH: 28.5 pg (ref 26.0–34.0)
MCHC: 31.3 g/dL (ref 30.0–36.0)
MCV: 91 fL (ref 80.0–100.0)
Platelets: 284 10*3/uL (ref 150–400)
RBC: 2.67 MIL/uL — AB (ref 3.87–5.11)
RDW: 18.5 % — ABNORMAL HIGH (ref 11.5–15.5)
WBC: 10.4 10*3/uL (ref 4.0–10.5)
nRBC: 0 % (ref 0.0–0.2)

## 2017-12-19 LAB — GLUCOSE, CAPILLARY
Glucose-Capillary: 102 mg/dL — ABNORMAL HIGH (ref 70–99)
Glucose-Capillary: 132 mg/dL — ABNORMAL HIGH (ref 70–99)
Glucose-Capillary: 144 mg/dL — ABNORMAL HIGH (ref 70–99)
Glucose-Capillary: 154 mg/dL — ABNORMAL HIGH (ref 70–99)
Glucose-Capillary: 160 mg/dL — ABNORMAL HIGH (ref 70–99)
Glucose-Capillary: 71 mg/dL (ref 70–99)

## 2017-12-19 LAB — LACTATE DEHYDROGENASE: LDH: 168 U/L (ref 98–192)

## 2017-12-19 MED ORDER — INSULIN ASPART 100 UNIT/ML ~~LOC~~ SOLN
3.0000 [IU] | SUBCUTANEOUS | Status: DC
  Administered 2017-12-19 – 2017-12-21 (×10): 3 [IU] via SUBCUTANEOUS

## 2017-12-19 NOTE — Consult Note (Signed)
[facility] Surgery Consult Note  Rakisha Pincock 1927-11-17  [id number].    Requesting MD: Lonny Prude, MD  Chief Complaint/Reason for Consult: umbilical hernia  HPI:  Ms. Fullam is a pleasant 84 y/o F with MMP, recently admitted for failure to thrive and placement of percutaneous gastrostomy tube. During exam today 11/19 the hospitalist noticed a hard, tender nodule near the umbilicus. General surgery was asked to consult to r/o incarcerated hernia.   ROS: Review of Systems  Constitutional: Positive for weight loss.  Gastrointestinal: Positive for abdominal pain and diarrhea.  All other systems reviewed and are negative.   Family History  Problem Relation Age of Onset  . Diabetes Father 62  . Unexplained death Mother 55  . CAD Neg Hx     Past Medical History:  Diagnosis Date  . Acute renal injury ([place]) 07/2016  . Anginal pain ([place]) 2004; 2011  . CAD (coronary artery disease)    status post stenting of the RCA status post CABG. (left internal mammary artery to  LAD, saphenous vein graft to second diagonal, saphenous vein  graft to obtuse marginal 1, saphenous vein graft to posterior  descending).  . Cardiomyopathy     Mildly reduced EF (45%).   . Chronic kidney disease (CKD), stage III (moderate) ([place])   . History of blood transfusion 1960s   "related to anemia"  . HTN (hypertension)   . Pleural effusion 2011   S/P "bypass"  . Shortness of breath   . Stroke ([place]) 09/19/2016   "left sided weakness" (09/20/2016)  . Type II diabetes mellitus ([place])     Past Surgical History:  Procedure Laterality Date  . BOTOX INJECTION  11/20/2017   Procedure: BOTOX INJECTION;  Surgeon: Doran Stabler, MD;  Location: [facility];  Service: Gastroenterology;;  . CATARACT EXTRACTION W/ INTRAOCULAR LENS  IMPLANT, BILATERAL Bilateral   . CORONARY ANGIOPLASTY WITH STENT PLACEMENT  2004   "LAD & one of the circumflex"  . CORONARY ARTERY BYPASS GRAFT  2011   CABG X4  .  ESOPHAGOGASTRODUODENOSCOPY (EGD) WITH PROPOFOL N/A 11/20/2017   Procedure: ESOPHAGOGASTRODUODENOSCOPY (EGD) WITH PROPOFOL;  Surgeon: Doran Stabler, MD;  Location: [facility];  Service: Gastroenterology;  Laterality: N/A;  . IR GASTROSTOMY TUBE MOD SED  12/10/2017    Social History:  reports that she has never smoked. She has never used smokeless tobacco. She reports that she does not drink alcohol or use drugs.  Allergies:  Allergies  Allergen Reactions  . Contrast Media [Iodinated Diagnostic Agents]     Sensitivity, kidney issues    Medications Prior to Admission  Medication Sig Dispense Refill  . acetaminophen (TYLENOL) 500 MG tablet Take 500 mg by mouth every 6 (six) hours as needed for headache.     Marland Kitchen amLODipine (NORVASC) 10 MG tablet TAKE ONE TABLET BY MOUTH ONCE DAILY. (Patient taking differently: Take 10 mg by mouth daily. ) 90 tablet 3  . atorvastatin (LIPITOR) 40 MG tablet Take 1 tablet (40 mg total) by mouth daily. (Patient taking differently: Take 40 mg by mouth daily at 6 PM. ) 60 tablet 3  . cholecalciferol (VITAMIN D) 400 units TABS tablet Take 1 tablet (400 Units total) by mouth daily. 30 each 0  . ferrous sulfate 220 (44 Fe) MG/5ML solution Take 10 mLs by mouth daily.  2  . glipiZIDE (GLUCOTROL) 5 MG tablet Take 2 tablets (10 mg total) by mouth daily before breakfast.    . isosorbide mononitrate (IMDUR) 30 MG 24  hr tablet TAKE 1 TABLET BY MOUTH ONCE DAILY (Patient taking differently: Take 30 mg by mouth daily. ) 30 tablet 5  . megestrol (MEGACE) 20 MG tablet Take 20 mg by mouth at bedtime.    . metoprolol tartrate 75 MG TABS Take 75 mg by mouth 2 (two) times daily.    . mirtazapine (REMERON) 15 MG tablet Take 7.5 mg by mouth at bedtime.    . nitroGLYCERIN (NITROSTAT) 0.4 MG SL tablet Place 1 tablet (0.4 mg total) under the tongue every 5 (five) minutes x 3 doses as needed for chest pain. 25 tablet 2  . pantoprazole (PROTONIX) 40 MG tablet Take 1 tablet (40 mg total)  by mouth 2 (two) times daily.    . polyethylene glycol (MIRALAX / GLYCOLAX) packet Take 17 g by mouth daily. 14 each 0  . senna (SENOKOT) 8.6 MG TABS tablet Take 1 tablet (8.6 mg total) by mouth daily. (Patient taking differently: Take 1 tablet by mouth at bedtime. ) 120 each 0  . feeding supplement, ENSURE ENLIVE, (ENSURE ENLIVE) LIQD Take 237 mLs by mouth 2 (two) times daily between meals. (Patient not taking: Reported on 12/05/2017) 237 mL 12  . megestrol (MEGACE) 400 MG/10ML suspension Take 10 mLs (400 mg total) by mouth daily. (Patient not taking: Reported on 12/05/2017) 240 mL 0  . protein supplement shake (PREMIER PROTEIN) LIQD Take 325 mLs (11 oz total) by mouth daily. (Patient not taking: Reported on 11/04/2017) 30 Can 0    Blood pressure (!) 150/75, pulse 82, temperature 98.5 F (36.9 C), temperature source Oral, resp. rate 19, weight 60.3 kg, SpO2 100 %. Physical Exam: Physical Exam  Constitutional: She is oriented to person, place, and time. She appears well-developed. No distress.  Chronically ill appearing  HENT:  Head: Normocephalic and atraumatic.  Right Ear: External ear normal.  Left Ear: External ear normal.  Nose: Nose normal.  Eyes: Pupils are equal, round, and reactive to light. Conjunctivae and EOM are normal.  Neck: Normal range of motion. Neck supple. No tracheal deviation present.  Cardiovascular: Normal rate, regular rhythm and intact distal pulses. Exam reveals no gallop and no friction rub.  Pulmonary/Chest: Effort normal and breath sounds normal. No stridor. No respiratory distress. She has no wheezes.  Abdominal: Soft. Bowel sounds are normal. She exhibits no distension. There is no tenderness. A hernia (soft, reducible umbilical hernia with palpable lymph node) is present.  Musculoskeletal: Normal range of motion. She exhibits no edema or deformity.  Neurological: She is alert and oriented to person, place, and time. No sensory deficit.  Skin: Skin is warm and  dry. No rash noted. She is not diaphoretic.  Psychiatric: She has a normal mood and affect. Her behavior is normal.    Results for orders placed or performed during the hospital encounter of 12/05/17 (from the past 48 hour(s))  Glucose, capillary     Status: Abnormal   Collection Time: 12/17/17  4:22 PM  Result Value Ref Range   Glucose-Capillary 181 (H) 70 - 99 mg/dL  Glucose, capillary     Status: Abnormal   Collection Time: 12/17/17  8:23 PM  Result Value Ref Range   Glucose-Capillary 156 (H) 70 - 99 mg/dL  Glucose, capillary     Status: Abnormal   Collection Time: 12/18/17 12:02 AM  Result Value Ref Range   Glucose-Capillary 150 (H) 70 - 99 mg/dL  Glucose, capillary     Status: Abnormal   Collection Time: 12/18/17  4:15 AM  Result Value Ref Range   Glucose-Capillary 143 (H) 70 - 99 mg/dL  Glucose, capillary     Status: Abnormal   Collection Time: 12/18/17  8:55 AM  Result Value Ref Range   Glucose-Capillary 159 (H) 70 - 99 mg/dL  Glucose, capillary     Status: Abnormal   Collection Time: 12/18/17 11:50 AM  Result Value Ref Range   Glucose-Capillary 166 (H) 70 - 99 mg/dL  CBC     Status: Abnormal   Collection Time: 12/18/17  1:44 PM  Result Value Ref Range   WBC 10.8 (H) 4.0 - 10.5 K/uL   RBC 2.56 (L) 3.87 - 5.11 MIL/uL   Hemoglobin 7.1 (L) 12.0 - 15.0 g/dL   HCT 23.6 (L) 36.0 - 46.0 %   MCV 92.2 80.0 - 100.0 fL   MCH 27.7 26.0 - 34.0 pg   MCHC 30.1 30.0 - 36.0 g/dL   RDW 18.6 (H) 11.5 - 15.5 %   Platelets 255 150 - 400 K/uL   nRBC 0.0 0.0 - 0.2 %    Comment: Performed at [facility], [address] [place], [place] [address]  Glucose, capillary     Status: Abnormal   Collection Time: 12/18/17  5:02 PM  Result Value Ref Range   Glucose-Capillary 223 (H) 70 - 99 mg/dL  Glucose, capillary     Status: Abnormal   Collection Time: 12/18/17  8:13 PM  Result Value Ref Range   Glucose-Capillary 204 (H) 70 - 99 mg/dL  Glucose, capillary     Status: Abnormal    Collection Time: 12/19/17 12:19 AM  Result Value Ref Range   Glucose-Capillary 132 (H) 70 - 99 mg/dL  Glucose, capillary     Status: None   Collection Time: 12/19/17  3:12 AM  Result Value Ref Range   Glucose-Capillary 71 70 - 99 mg/dL  Glucose, capillary     Status: Abnormal   Collection Time: 12/19/17  7:37 AM  Result Value Ref Range   Glucose-Capillary 102 (H) 70 - 99 mg/dL  CBC     Status: Abnormal   Collection Time: 12/19/17 11:03 AM  Result Value Ref Range   WBC 10.4 4.0 - 10.5 K/uL   RBC 2.67 (L) 3.87 - 5.11 MIL/uL   Hemoglobin 7.6 (L) 12.0 - 15.0 g/dL   HCT 24.3 (L) 36.0 - 46.0 %   MCV 91.0 80.0 - 100.0 fL   MCH 28.5 26.0 - 34.0 pg   MCHC 31.3 30.0 - 36.0 g/dL   RDW 18.5 (H) 11.5 - 15.5 %   Platelets 284 150 - 400 K/uL   nRBC 0.0 0.0 - 0.2 %    Comment: Performed at [facility], [place]. [address], [place], [place] [address]  Renal function panel     Status: Abnormal   Collection Time: 12/19/17 11:03 AM  Result Value Ref Range   Sodium 138 135 - 145 mmol/L   Potassium 4.5 3.5 - 5.1 mmol/L   Chloride 108 98 - 111 mmol/L   CO2 24 22 - 32 mmol/L   Glucose, Bld 172 (H) 70 - 99 mg/dL   BUN 22 8 - 23 mg/dL   Creatinine, Ser 1.34 (H) 0.44 - 1.00 mg/dL   Calcium 8.7 (L) 8.9 - 10.3 mg/dL   Phosphorus 3.2 2.5 - 4.6 mg/dL   Albumin 1.5 (L) 3.5 - 5.0 g/dL   GFR calc non Af Amer 34 (L) >60 mL/min   GFR calc Af Amer 40 (L) >60  mL/min    Comment: (NOTE) The eGFR has been calculated using the CKD EPI equation. This calculation has not been validated in all clinical situations. eGFR's persistently <60 mL/min signify possible Chronic Kidney Disease.    Anion gap 6 5 - 15    Comment: Performed at [facility] [address], [place], [place] [address]  Glucose, capillary     Status: Abnormal   Collection Time: 12/19/17 11:13 AM  Result Value Ref Range   Glucose-Capillary 144 (H) 70 - 99 mg/dL   US Abdomen Limited  Result Date: 12/19/2017 CLINICAL DATA:   Periumbilical nodule. EXAM: ULTRASOUND ABDOMEN LIMITED COMPARISON:  CT scan of November 04, 2017. FINDINGS: Limited sonographic evaluation of the periumbilical region demonstrates moderate size hernia. There is solid density within the hernia which may represent bowel loops, although no peristalsis is observed. The possibility of this representing enlarged lymph node or other neoplasm cannot be excluded. IMPRESSION: Moderate size periumbilical hernia is noted. Solid density is noted within the hernia which may represent bowel loop, but enlarged lymph node or neoplasm cannot be excluded. Clinical correlation is recommended. Electronically Signed   By: Marijo Conception, M.D.   On: 12/19/2017 12:44   Assessment/Plan Umbilical hernia  - soft, reducible, non-tender  - CT from October 2019 shows lymph node within fat-containing umbilical hernia, consistent with possible sister mary joseph nodule. - no acute surgical needs. Call as needed.   Jill Alexanders, [facility] Surgery 12/19/2017, 4:07 PM Pager: [phone] Consults: [phone]

## 2017-12-19 NOTE — Progress Notes (Signed)
PROGRESS NOTE    [name]  EYC:[id number] DOB: [birth year] DOA: 12/05/2017 PCP: Deland Pretty, MD   Brief Narrative: [name] is a 84 y.o. femalewith medical history significant forDM2, HTN, HLD, CAD s/p CABG, h/o CVA, CKD3. She presented secondary to dysarthria. MRI significant for no stroke. Also with failure to thrive with PEG tube placed. Working up confusion.    Assessment & Plan:   Principal Problem:   Dysarthria Active Problems:   Diabetes mellitus ([place])   Hyperlipidemia   Essential hypertension   Coronary atherosclerosis   Stroke (cerebrum) ([place])   Stage 3 chronic kidney disease ([place])   Acute lower UTI   Adult failure to thrive   UTI (urinary tract infection)   DNR (do not resuscitate) discussion   Palliative care by specialist   Dysarthria Appears to be improved.  Metabolic encephalopathy Unknown etiology but possibly related to significant dehydration vs ?hypercalcemia vs infection vs malnutrition. UTI treated with little improvement. Also possibly related to failure to thrive. MRI brain without acute process but did show chronic advance atherosclerotic disease. Patient is very different (decreased) from my last encounter about one month ago with regard to mental status. Urinalysis suggests possible inadequate treatment. Ionized calcium mildly elevated. Repeat urine culture with no growth. Improving. Not at baseline but will hopefully improve with continued nutrition.  History of stroke CAD -Continue Lipitor  Essential hypertension -metoprolol -Imdur (switched to immediate release per tube)  CKD stage 3 Stable  Failure to thrive Patient is s/p PEG tube. Currently on tube feeding. TPN off. -Continue tube feeding -SSI q4 hours -Continue to Lantus 20 units and decrease to Novolog 3 units q4 hours -imodium prn for diarrhea  Decreased urine output Unsure if this is true oliguria vs incontinence. Appears to be related to incontinence. Urine culture  with no growth. -Strict in and out  Enterococcus UTI Patient is s/p ampicillin. Treated. Repeat urine culture with no growth  Decreased breath sounds Asymptomatic per patient. Chest x-ray unremarkable.  Anemia Acute on chronic. Recent iron panel significant for chronic disease picture. Normocytic. -Repeat CBC this AM -Stop subcutaneous heparin  Subcutaneous nodule Painful. No overlying ecchymosis -Ultrasound    Pressure Injury Documentation: Pressure Injury 11/04/17 Stage II -  Partial thickness loss of dermis presenting as a shallow open ulcer with a red, pink wound bed without slough. (Active)  11/04/17 1530   Location: Sacrum  Location Orientation: Medial  Staging: Stage II -  Partial thickness loss of dermis presenting as a shallow open ulcer with a red, pink wound bed without slough.  Wound Description (Comments):   Present on Admission: Yes     Pressure Injury 11/04/17 Stage II -  Partial thickness loss of dermis presenting as a shallow open ulcer with a red, pink wound bed without slough. (Active)  11/04/17 1530   Location: Sacrum  Location Orientation: Medial  Staging: Stage II -  Partial thickness loss of dermis presenting as a shallow open ulcer with a red, pink wound bed without slough.  Wound Description (Comments):   Present on Admission: Yes     DVT prophylaxis: SCDs Code Status:   Code Status: Full Code Family Communication: Son at bedside Disposition Plan: Discharge to SNF when bed available   Consultants:   None  Procedures:   11/10: PEG tube placement  Antimicrobials:  Ceftriaxone (11/5>>11/8)  Cefazolin (11/10)  Ampicillin (11/8>>11/12)   Subjective: Diarrhea has slowed down.  Objective: Vitals:   12/18/17 2013 12/19/17 0506 12/19/17 0542 12/19/17 0700  BP: 140/77 (!) 159/66  (!) 150/75  Pulse: 92 90  82  Resp: 18 18  19   Temp: 98.5 F (36.9 C) 98.5 F (36.9 C)    TempSrc: Oral Oral    SpO2: 99% 100%  100%  Weight:   60.3  kg     Intake/Output Summary (Last 24 hours) at 12/19/2017 1041 Last data filed at 12/19/2017 0836 Gross per 24 hour  Intake 2799.39 ml  Output 0 ml  Net 2799.39 ml   Filed Weights   12/14/17 1936 12/17/17 2023 12/19/17 0542  Weight: 54 kg 54 kg 60.3 kg    Examination:  General exam: Appears calm and comfortable Respiratory system: Clear to auscultation. Respiratory effort normal. Cardiovascular system: S1 & S2 heard, RRR. No murmurs, rubs, gallops or clicks. Gastrointestinal system: Abdomen is nondistended, soft and nontender. Subcutaneous nodule felt left lateral to umbilicus with tenderness Normal bowel sounds heard. Central nervous system: Alert and oriented to person. Extremities: No edema. No calf tenderness Skin: No cyanosis. No rashes Psychiatry: Judgement and insight appear normal. Mood & affect appropriate.     Data Reviewed: I have personally reviewed following labs and imaging studies  CBC: Recent Labs  Lab 12/13/17 1749 12/14/17 1237 12/15/17 0423 12/16/17 1419 12/18/17 1344  WBC 8.6 9.2 9.9 8.4 10.8*  HGB 8.7* 8.1* 8.0* 7.6* 7.1*  HCT 27.8* 26.2* 25.9* 25.7* 23.6*  MCV 88.5 89.4 90.6 92.1 92.2  PLT 246 236 238 269 665   Basic Metabolic Panel: Recent Labs  Lab 12/12/17 1759 12/13/17 1749 12/14/17 0958 12/15/17 0423 12/16/17 1419  NA  --  134* 134* 136 138  K  --  4.7 4.0 4.1 4.2  CL  --  103 106 106 108  CO2  --  25 24 25 26   GLUCOSE  --  482* 256* 266* 225*  BUN  --  22 21 22 22   CREATININE  --  1.29* 1.22* 1.32* 1.31*  CALCIUM  --  9.1 8.4* 8.4* 8.9  MG 2.0  --  1.8  --   --   PHOS 4.4 2.1* 1.8* 1.9* 2.2*   GFR: Estimated Creatinine Clearance: 24.6 mL/min (A) (by C-G formula based on SCr of 1.31 mg/dL (H)). Liver Function Tests: Recent Labs  Lab 12/13/17 1749 12/14/17 0958 12/15/17 0423 12/16/17 1419  ALBUMIN 1.6* 1.5* 1.5* 1.5*   No results for input(s): LIPASE, AMYLASE in the last 168 hours. No results for input(s): AMMONIA  in the last 168 hours. Coagulation Profile: No results for input(s): INR, PROTIME in the last 168 hours. Cardiac Enzymes: No results for input(s): CKTOTAL, CKMB, CKMBINDEX, TROPONINI in the last 168 hours. BNP (last 3 results) No results for input(s): PROBNP in the last 8760 hours. HbA1C: No results for input(s): HGBA1C in the last 72 hours. CBG: Recent Labs  Lab 12/18/17 1702 12/18/17 2013 12/19/17 0019 12/19/17 0312 12/19/17 0737  GLUCAP 223* 204* 132* 71 102*   Lipid Profile: No results for input(s): CHOL, HDL, LDLCALC, TRIG, CHOLHDL, LDLDIRECT in the last 72 hours. Thyroid Function Tests: No results for input(s): TSH, T4TOTAL, FREET4, T3FREE, THYROIDAB in the last 72 hours. Anemia Panel: No results for input(s): VITAMINB12, FOLATE, FERRITIN, TIBC, IRON, RETICCTPCT in the last 72 hours. Sepsis Labs: No results for input(s): PROCALCITON, LATICACIDVEN in the last 168 hours.  Recent Results (from the past 240 hour(s))  Culture, Urine     Status: None   Collection Time: 12/13/17  6:19 PM  Result Value Ref Range Status  Specimen Description URINE, CLEAN CATCH  Final   Special Requests NONE  Final   Culture   Final    NO GROWTH Performed at [facility], [place] [address] [place], [place] [address]    Report Status 12/14/2017 FINAL  Final         Radiology Studies: No results found.      Scheduled Meds: . sodium chloride   Intravenous Once  . atorvastatin  40 mg Per Tube q1800  . cholecalciferol  400 Units Per Tube Daily  . ferrous sulfate  300 mg Per Tube Daily  . free water  100 mL Per Tube Q8H  . insulin aspart  0-15 Units Subcutaneous Q4H  . insulin aspart  3 Units Subcutaneous Q4H  . insulin glargine  20 Units Subcutaneous QHS  . isosorbide dinitrate  10 mg Per Tube BID  . megestrol  400 mg Oral Daily  . metoprolol tartrate  75 mg Oral BID  . mirtazapine  7.5 mg Per Tube QHS  . multivitamin  15 mL Per Tube Daily  . pantoprazole sodium  40 mg  Per Tube BID  . sennosides  5 mL Per Tube QHS   Continuous Infusions: . sodium chloride 50 mL/hr at 12/16/17 1450  . feeding supplement (JEVITY 1.2 CAL) 1,000 mL (12/19/17 0035)     LOS: 10 days     Cordelia Poche, MD [facility] 12/19/2017, 10:41 AM  If 7PM-7AM, please contact night-coverage [email or web address]

## 2017-12-19 NOTE — Progress Notes (Signed)
Physical Therapy Treatment Patient Details Name: [name] MRN: [id number] DOB: [birth year] Today's Date: 12/19/2017    History of Present Illness 84 yo female with onset of UTI and AMS was admitted with ongoing loss of wgt and dehydration resulting from achalasia with poor result from botox in visit from 11/20/17.  Pt has FTT, has lost 50 pounds in last year. PMHx:  CAD, AKI, angina, cardiomegaly, CABG, CVA with L hemi, CKD 3, transfusion, pleural effusion, DM, achalasia with botox procedure. G-tube placement 12/10/17.     PT Comments    Pt admitted with above diagnosis. Pt currently with functional limitations due to the deficits listed below (see PT Problem List). At the time of PT eval pt was able to participate with EOB activity for ~10 minutes. Pt continues to fatigue quickly and based on performance today feel she is most appropriate for Maxi-Move lift OOB with nursing staff between therapy sessions. Pt will benefit from skilled PT to increase their independence and safety with mobility to allow discharge to the venue listed below.     Follow Up Recommendations  SNF;Supervision/Assistance - 24 hour     Equipment Recommendations  None recommended by PT    Recommendations for Other Services       Precautions / Restrictions Precautions Precautions: Fall Precaution Comments: coccygeal wound, Prevalon boots/ Peg 11/10 with abdominal binder, geo mat Restrictions Weight Bearing Restrictions: No    Mobility  Bed Mobility Overal bed mobility: Needs Assistance Bed Mobility: Rolling;Sidelying to Sit;Sit to Supine Rolling: Max assist Sidelying to sit: Max assist;+2 for physical assistance   Sit to supine: Total assist   General bed mobility comments: +2 assist required for transition to sit EOB. Bedpad utilized to assist with scooting and positioning.  Transfers                 General transfer comment: We were not able to progress to OOB this session.    Ambulation/Gait                 Stairs             Wheelchair Mobility    Modified Rankin (Stroke Patients Only)       Balance Overall balance assessment: Needs assistance Sitting-balance support: Feet supported;Bilateral upper extremity supported Sitting balance-Leahy Scale: Poor Sitting balance - Comments: Posteriorly, pt required mostly min guard assist and occasional min assist. When pt began laterally leaning to the R, up to max assist was provided to return to midline. In sitting, pt participated in brief reaching activity and LE LAQ's before she fatigued.  Postural control: Posterior lean;Right lateral lean                                  Cognition Arousal/Alertness: Lethargic Behavior During Therapy: Flat affect Overall Cognitive Status: Impaired/Different from baseline Area of Impairment: Attention;Following commands;Safety/judgement;Awareness                   Current Attention Level: Sustained   Following Commands: Follows one step commands inconsistently;Follows one step commands with increased time Safety/Judgement: Decreased awareness of safety;Decreased awareness of deficits Awareness: Intellectual Problem Solving: Slow processing;Decreased initiation;Difficulty sequencing;Requires verbal cues General Comments: pt requires incr time to initiate and unable to sustain task without additional cues.       Exercises General Exercises - Lower Extremity Long Arc Quad: AAROM;Both;10 reps    General Comments General comments (skin integrity, edema,  etc.): vaginal bleeding noted throughout session.       Pertinent Vitals/Pain Pain Assessment: Faces Faces Pain Scale: Hurts a little bit Pain Location: General grimacing with movement. Pt did not specify a pain location. Pain Intervention(s): Monitored during session;Repositioned    Home Living Family/patient expects to be discharged to:: Skilled nursing facility Living  Arrangements: Children Available Help at Discharge: Available PRN/intermittently Type of Home: House Home Access: Stairs to enter Entrance Stairs-Rails: None Home Layout: Multi-level Home Equipment: Environmental consultant - 2 wheels;Shower seat;Wheelchair - manual Additional Comments: son from out of state is present     Prior Function Level of Independence: Needs assistance  Gait / Transfers Assistance Needed: RW with no assist in recent history per chart ADL's / Homemaking Assistance Needed: living in her own home recently and performing ADLs. Family hired someone for IADLs Comments: amb with RW    PT Goals (current goals can now be found in the care plan section) Acute Rehab PT Goals Patient Stated Goal: None stated this session PT Goal Formulation: With family Time For Goal Achievement: 12/20/17 Potential to Achieve Goals: Fair Progress towards PT goals: Progressing toward goals    Frequency    Min 2X/week      PT Plan Current plan remains appropriate    Co-evaluation              AM-PAC PT "6 Clicks" Daily Activity  Outcome Measure  Difficulty turning over in bed (including adjusting bedclothes, sheets and blankets)?: Unable Difficulty moving from lying on back to sitting on the side of the bed? : Unable Difficulty sitting down on and standing up from a chair with arms (e.g., wheelchair, bedside commode, etc,.)?: Unable Help needed moving to and from a bed to chair (including a wheelchair)?: Total Help needed walking in hospital room?: Total Help needed climbing 3-5 steps with a railing? : Total 6 Click Score: 6    End of Session Equipment Utilized During Treatment: Gait belt Activity Tolerance: Patient limited by fatigue Patient left: in bed;with call bell/phone within reach;Other (comment) Nurse Communication: Mobility status PT Visit Diagnosis: Muscle weakness (generalized) (M62.81);Other abnormalities of gait and mobility (R26.89);Adult, failure to thrive  (R62.7);Hemiplegia and hemiparesis Hemiplegia - Right/Left: Left Hemiplegia - dominant/non-dominant: Non-dominant Hemiplegia - caused by: Unspecified     Time: 1030-1058 PT Time Calculation (min) (ACUTE ONLY): 28 min  Charges:  $Therapeutic Activity: 23-37 mins                     [name], PT, DPT Acute Rehabilitation Services Pager: [phone] Office: [phone]    [name] 12/19/2017, 12:22 PM

## 2017-12-19 NOTE — Care Management Note (Signed)
Case Management Note  Patient Details  Name: [name] MRN: [id number] Date of Birth: May 16, 1927  Subjective/Objective:    Metabolic encephalopathy, hx stroke                Action/Plan: Please see previous NCM notes NCM contacted son, Juanda Crumble. Will arrange with [facility] for delivery of hospital bed, wheelchair and tube feedings to the home. Son plans to take pt with with [facility]. Contacted [facility] to notify of possible dc home tomorrow. [facility] rep will contact son, Geralynn Ochs to arrange delivery. CSW aware and has spoke to [facility].   Expected Discharge Date:                  Expected Discharge Plan:  [facility](From [facility])  In-House Referral:  Clinical Social Work  Discharge planning Services  CM Consult  Post Acute Care Choice:  Home Health Choice offered to:  Adult Children  DME Arranged:  Hospital bed, Tube feeding, Tube feeding pump, Lightweight manual wheelchair with seat cushion DME Agency:  [place]:  RN, PT, OT, Nurse's Aide, Social Work, Theme park manager Therapy HH Agency:  [facility]  Status of Service:  Completed, signed off  If discussed at [employer], dates discussed:    Additional Comments:  Erenest Rasher, RN 12/19/2017, 12:21 PM

## 2017-12-19 NOTE — Progress Notes (Signed)
    Durable Medical Equipment  (From admission, onward)         Start     Ordered   12/19/17 1128  For home use only DME standard manual wheelchair with seat cushion  Once    Comments:  Patient suffers from bacteremia, failure to thrive, history of CVA which impairs their ability to perform daily activities like bathing, dressing, feeding, grooming and toileting in the home.  A cane, crutch or walker will not resolve  issue with performing activities of daily living. A wheelchair will allow patient to safely perform daily activities. Patient can safely propel the wheelchair in the home or has a caregiver who can provide assistance.  Accessories: elevating leg rests (ELRs), wheel locks, extensions and anti-tippers.   12/19/17 1128   12/19/17 0900  For home use only DME Hospital bed  Once    Question Answer Comment  Patient has (list medical condition): Stroke, PEG feedings   The above medical condition requires: Patient requires the ability to reposition frequently   Head must be elevated greater than: Other see comments   Bed type Semi-electric   Hoyer Lift Yes      12/19/17 0900   12/18/17 1701  For home use only DME Tube feeding pump  Once    Comments:  Continue Jevity 1.2 @ 55 ml/hr via PEG  100 ml free water flush every 8 hours   Complete TF regimen providing 1584 kcals, 73 grams protein, and 1365 ml free water, which meets 100% of estimated kcal and protein needs.     1 can (237 mL) Jevity 1.5 QID via PEG  85 ml free water flush before and after each feeding administration  Regimen rovides 1420 kcals,60 g of protein, and 1400 ml free water    12/18/17 1705

## 2017-12-19 NOTE — Care Management (Addendum)
. ..      Durable Medical Equipment  (From admission, onward)         Start     Ordered   12/19/17 0900  For home use only DME Hospital bed  Once    Question Answer Comment  Patient has (list medical condition): Stroke, PEG feedings   The above medical condition requires: Patient requires the ability to reposition frequently   Head must be elevated greater than: Other see comments   Bed type Semi-electric   Hoyer Lift Yes      12/19/17 0900   12/18/17 1701  For home use only DME Tube feeding pump  Once    Comments:  Continue Jevity 1.2 @ 55 ml/hr via PEG  100 ml free water flush every 8 hours   Complete TF regimen providing 1584 kcals, 73 grams protein, and 1365 ml free water, which meets 100% of estimated kcal and protein needs.     1 can (237 mL) Jevity 1.5 QID via PEG  85 ml free water flush before and after each feeding administration  Regimen rovides 1420 kcals,60 g of protein, and 1400 ml free water    12/18/17 1705

## 2017-12-20 ENCOUNTER — Inpatient Hospital Stay (HOSPITAL_COMMUNITY): Payer: Medicare Other

## 2017-12-20 DIAGNOSIS — I6930 Unspecified sequelae of cerebral infarction: Secondary | ICD-10-CM

## 2017-12-20 LAB — HEPATIC FUNCTION PANEL
ALBUMIN: 1.4 g/dL — AB (ref 3.5–5.0)
ALK PHOS: 54 U/L (ref 38–126)
ALT: 21 U/L (ref 0–44)
AST: 27 U/L (ref 15–41)
BILIRUBIN DIRECT: 0.1 mg/dL (ref 0.0–0.2)
BILIRUBIN TOTAL: 0.5 mg/dL (ref 0.3–1.2)
Indirect Bilirubin: 0.4 mg/dL (ref 0.3–0.9)
Total Protein: 5.3 g/dL — ABNORMAL LOW (ref 6.5–8.1)

## 2017-12-20 LAB — BLOOD GAS, ARTERIAL
Acid-base deficit: 2.8 mmol/L — ABNORMAL HIGH (ref 0.0–2.0)
Bicarbonate: 20.1 mmol/L (ref 20.0–28.0)
Drawn by: 51133
FIO2: 100
O2 SAT: 99.7 %
PATIENT TEMPERATURE: 98.6
PCO2 ART: 26.9 mmHg — AB (ref 32.0–48.0)
PO2 ART: 225 mmHg — AB (ref 83.0–108.0)
pH, Arterial: 7.486 — ABNORMAL HIGH (ref 7.350–7.450)

## 2017-12-20 LAB — GLUCOSE, CAPILLARY
Glucose-Capillary: 128 mg/dL — ABNORMAL HIGH (ref 70–99)
Glucose-Capillary: 148 mg/dL — ABNORMAL HIGH (ref 70–99)
Glucose-Capillary: 153 mg/dL — ABNORMAL HIGH (ref 70–99)
Glucose-Capillary: 164 mg/dL — ABNORMAL HIGH (ref 70–99)
Glucose-Capillary: 186 mg/dL — ABNORMAL HIGH (ref 70–99)
Glucose-Capillary: 192 mg/dL — ABNORMAL HIGH (ref 70–99)

## 2017-12-20 LAB — AMMONIA: Ammonia: 11 umol/L (ref 9–35)

## 2017-12-20 LAB — CBC
HEMATOCRIT: 25.2 % — AB (ref 36.0–46.0)
Hemoglobin: 7.7 g/dL — ABNORMAL LOW (ref 12.0–15.0)
MCH: 28 pg (ref 26.0–34.0)
MCHC: 30.6 g/dL (ref 30.0–36.0)
MCV: 91.6 fL (ref 80.0–100.0)
Platelets: 300 10*3/uL (ref 150–400)
RBC: 2.75 MIL/uL — ABNORMAL LOW (ref 3.87–5.11)
RDW: 18.6 % — AB (ref 11.5–15.5)
WBC: 8.8 10*3/uL (ref 4.0–10.5)
nRBC: 0 % (ref 0.0–0.2)

## 2017-12-20 LAB — ERYTHROPOIETIN: Erythropoietin: 20.1 m[IU]/mL — ABNORMAL HIGH (ref 2.6–18.5)

## 2017-12-20 LAB — BRAIN NATRIURETIC PEPTIDE

## 2017-12-20 MED ORDER — ALBUTEROL SULFATE (2.5 MG/3ML) 0.083% IN NEBU
INHALATION_SOLUTION | RESPIRATORY_TRACT | Status: AC
  Filled 2017-12-20: qty 3

## 2017-12-20 MED ORDER — FUROSEMIDE 10 MG/ML IJ SOLN
INTRAMUSCULAR | Status: AC
  Filled 2017-12-20: qty 2

## 2017-12-20 MED ORDER — JEVITY 1.5 CAL/FIBER PO LIQD: 237.0000 mL | Freq: Four times a day (QID) | ORAL | Status: DC

## 2017-12-20 MED ORDER — ALBUTEROL SULFATE (2.5 MG/3ML) 0.083% IN NEBU
5.0000 mg | INHALATION_SOLUTION | Freq: Once | RESPIRATORY_TRACT | Status: AC
  Administered 2017-12-20: 5 mg via RESPIRATORY_TRACT
  Filled 2017-12-20: qty 6

## 2017-12-20 MED ORDER — IPRATROPIUM BROMIDE 0.02 % IN SOLN
0.5000 mg | Freq: Once | RESPIRATORY_TRACT | Status: AC
  Administered 2017-12-20: 0.5 mg via RESPIRATORY_TRACT
  Filled 2017-12-20: qty 2.5

## 2017-12-20 MED ORDER — FREE WATER
250.0000 mL | Freq: Three times a day (TID) | Status: DC
  Administered 2017-12-21 – 2017-12-25 (×12): 250 mL

## 2017-12-20 MED ORDER — FUROSEMIDE 10 MG/ML IJ SOLN
60.0000 mg | Freq: Once | INTRAMUSCULAR | Status: AC
  Administered 2017-12-20: 60 mg via INTRAVENOUS
  Filled 2017-12-20: qty 6

## 2017-12-20 MED ORDER — JEVITY 1.5 CAL/FIBER PO LIQD
237.0000 mL | Freq: Four times a day (QID) | ORAL | Status: DC
  Administered 2017-12-20 – 2017-12-23 (×12): 237 mL
  Filled 2017-12-20 (×18): qty 1000

## 2017-12-20 NOTE — Progress Notes (Signed)
OT Cancellation Note  Patient Details Name: [name] MRN: [id number] DOB: August 24, 1927   Cancelled Treatment:    Reason Eval/Treat Not Completed: Medical issues which prohibited therapy(Pt with elevated BP and labored breathing. Will hold and return as schedule allows. Thank you.)  [place], OTR/L Acute Rehab Pager: [phone] Office: [phone] 12/20/2017, 9:05 AM

## 2017-12-20 NOTE — Care Management (Signed)
Attending spoke with pts son - discharge plan continues to be home with home health instead of SNF once pt is appropriate for discharge.  Barrier to discharge continues to be pts mental status - not at baseline.  CM will continue to follow

## 2017-12-20 NOTE — Consult Note (Addendum)
Neurology Consultation  Reason for Consult: Failure to thrive along with decreasing memory Referring Physician: Dr. Tana Coast  History is obtained from: Chart as no history can be obtained by patient  HPI: [name] is a 84 y.o. female with past medical history of type 2 diabetes, stroke, shortness of breath, hypertension, chronic kidney disease, cardiomyopathy, CAD.  She was recently diagnosed with achalasia after 50 pound weight loss over the past year.  She was treated with Botox earlier in October and then sent to a SNF with hopes that her swallowing would be better.  She has not done well at that time and only drink small amounts of milk.  She was brought to the hospital after she was noted to be stuttering, and having difficulty getting words out.  This progressed until she seemed to not want to have any conversation.  At this time the son wanted to have a neurological consult to further evaluate his mother.  ED course: Given IVF, urinalysis showed a UTI.  MRI was negative for CVA.  Creatinine was elevated at 1.5.  ROS: Unable to obtain due to altered mental status.   Past Medical History:  Diagnosis Date  . Acute renal injury ([place]) 07/2016  . Anginal pain ([place]) 2004; 2011  . CAD (coronary artery disease)    status post stenting of the RCA status post CABG. (left internal mammary artery to  LAD, saphenous vein graft to second diagonal, saphenous vein  graft to obtuse marginal 1, saphenous vein graft to posterior  descending).  . Cardiomyopathy     Mildly reduced EF (45%).   . Chronic kidney disease (CKD), stage III (moderate) ([place])   . History of blood transfusion 1960s   "related to anemia"  . HTN (hypertension)   . Pleural effusion 2011   S/P "bypass"  . Shortness of breath   . Stroke ([place]) 09/19/2016   "left sided weakness" (09/20/2016)  . Type II diabetes mellitus ([facility])     Family History  Problem Relation Age of Onset  . Diabetes Father 4  . Unexplained death Mother 32  .  CAD Neg Hx      Social History:   reports that she has never smoked. She has never used smokeless tobacco. She reports that she does not drink alcohol or use drugs.  Medications  Current Facility-Administered Medications:  .  0.9 %  sodium chloride infusion (Manually program via Guardrails IV Fluids), , Intravenous, Once, Hosie Poisson, MD .  acetaminophen (TYLENOL) tablet 500 mg, 500 mg, Oral, Q6H PRN, Janora Norlander, MD, 500 mg at 12/18/17 1041 .  atorvastatin (LIPITOR) tablet 40 mg, 40 mg, Per Tube, q1800, Hosie Poisson, MD, 40 mg at 12/20/17 1739 .  cholecalciferol (VITAMIN D3) tablet 400 Units, 400 Units, Per Tube, Daily, Hosie Poisson, MD, 400 Units at 12/20/17 0906 .  feeding supplement (JEVITY 1.5 CAL/FIBER) liquid 237 mL, 237 mL, Per Tube, QID, Rai, Ripudeep K, MD, 237 mL at 12/20/17 1748 .  fentaNYL (SUBLIMAZE) injection, , Intravenous, PRN, Jacqulynn Cadet, MD, 25 mcg at 12/10/17 0915 .  ferrous sulfate 300 (60 Fe) MG/5ML syrup 300 mg, 300 mg, Per Tube, Daily, Hosie Poisson, MD, 300 mg at 12/20/17 0906 .  free water 250 mL, 250 mL, Per Tube, Q8H, Rai, Ripudeep K, MD .  glucagon (human recombinant) (GLUCAGEN) injection, , , PRN, Jacqulynn Cadet, MD, 1 mg at 12/10/17 0918 .  hydrALAZINE (APRESOLINE) injection 5 mg, 5 mg, Intravenous, Q6H PRN, Hosie Poisson, MD, 5 mg at  12/06/17 1206 .  insulin aspart (novoLOG) injection 0-15 Units, 0-15 Units, Subcutaneous, Q4H, Reginia Naas, RPH, 3 Units at 12/20/17 1740 .  insulin aspart (novoLOG) injection 3 Units, 3 Units, Subcutaneous, Q4H, Mariel Aloe, MD, 3 Units at 12/20/17 1741 .  insulin glargine (LANTUS) injection 20 Units, 20 Units, Subcutaneous, QHS, Mariel Aloe, MD, 20 Units at 12/19/17 2217 .  isosorbide dinitrate (ISORDIL) tablet 10 mg, 10 mg, Per Tube, BID, Mariel Aloe, MD, 10 mg at 12/20/17 0906 .  loperamide HCl (IMODIUM) 1 MG/7.5ML suspension 2 mg, 2 mg, Oral, PRN, Mariel Aloe, MD, 2 mg at 12/17/17  2201 .  metoprolol tartrate (LOPRESSOR) tablet 75 mg, 75 mg, Oral, BID, Hosie Poisson, MD, 75 mg at 12/20/17 0905 .  mirtazapine (REMERON) tablet 7.5 mg, 7.5 mg, Per Tube, QHS, Hosie Poisson, MD, 7.5 mg at 12/19/17 2217 .  multivitamin liquid 15 mL, 15 mL, Per Tube, Daily, Hosie Poisson, MD, 15 mL at 12/20/17 0906 .  nitroGLYCERIN (NITROSTAT) SL tablet 0.4 mg, 0.4 mg, Sublingual, Q5 Min x 3 PRN, Janora Norlander, MD .  ondansetron [facility]) injection 4 mg, 4 mg, Intravenous, Q8H PRN, Mariel Aloe, MD, 4 mg at 12/16/17 1715 .  pantoprazole sodium (PROTONIX) 40 mg/20 mL oral suspension 40 mg, 40 mg, Per Tube, BID, Hosie Poisson, MD, 40 mg at 12/20/17 0905 .  sennosides (SENOKOT) 8.8 MG/5ML syrup 5 mL, 5 mL, Per Tube, QHS, Hosie Poisson, MD, 5 mL at 12/19/17 2217 .  sodium chloride flush (NS) 0.9 % injection 10-40 mL, 10-40 mL, Intracatheter, PRN, Hosie Poisson, MD, 10 mL at 12/18/17 1824   Exam: Current vital signs: BP (!) 162/92 (BP Location: Left Arm)   Pulse (!) 114   Temp 99 F (37.2 C) (Oral)   Resp 18   Wt 54.4 kg   SpO2 95%   BMI 21.26 kg/m  Vital signs in last 24 hours: Temp:  [98.1 F (36.7 C)-99 F (37.2 C)] 99 F (37.2 C) (11/19 2047) Pulse Rate:  [90-124] 114 (11/20 0850) Resp:  [18] 18 (11/19 2047) BP: (145-175)/(70-115) 162/92 (11/20 0850) SpO2:  [95 %-98 %] 95 % (11/20 0850) Weight:  [54.4 kg] 54.4 kg (11/20 0500)  Physical Exam  Constitutional: Cachectic Eyes: No scleral injection HEENT: No OP obstrucion. Lips dry. Tongue well hydrated Head: Normocephalic.  Cardiovascular: Normal rate and regular rhythm.  Respiratory: Effort normal, non-labored breathing GI: Soft.  No distension. There is no tenderness.  Skin: WDI  Neuro: Mental Status: Patient at this time is awake with decreased level of alertness. She is laying on her left side holding onto the side rail of the bed, in NAD.  She will not follow any commands initially. After some coaxing she would answer  some orientation questions such as correctly naming the year and the name of her son, but could not state the day, month, location or circumstance. Also perseverates with responses to some questions. Speech is hypophonic and dysarthric. Much of her speech output is not intelligible.  When rolled on her back she seems to be in an uncomfortable position.  When trying to get her to follow commands she pushes me away. Cranial Nerves: II: Blinks to threat bilaterally. PERRL III,IV, VI: Will gaze conjugately to the right and left in response to visual stimuli and requests to look towards them. No nystagmus   V: Facial sensation grossly intact VII: Facial movement is symmetric.  VIII: hearing is intact to voice Patient would not  let me do any further examination of her cranial nerves Motor: Patient is moving all extremities antigravity; however this is only when she is attempting to push me away from doing examination.  She prefers to be laying on her left side curled up in a fetal position. On follow up exam, she withdraws BLE briskly to noxious and localizes to light sternal rub with BUE. No jerking, twitching, posturing or other adventitious movements noted.  Sensory: As above.  Deep Tendon Reflexes: Unable to obtain consistent reflexes as patient resists exam and is curled in a fetal position Plantars: Mute Cerebellar: Unable to examine  Labs I have reviewed labs in epic and the results pertinent to this consultation are:   CBC    Component Value Date/Time   WBC 8.8 12/20/2017 0400   RBC 2.75 (L) 12/20/2017 0400   HGB 7.7 (L) 12/20/2017 0400   HCT 25.2 (L) 12/20/2017 0400   PLT 300 12/20/2017 0400   MCV 91.6 12/20/2017 0400   MCH 28.0 12/20/2017 0400   MCHC 30.6 12/20/2017 0400   RDW 18.6 (H) 12/20/2017 0400   LYMPHSABS 1.1 12/05/2017 0940   MONOABS 0.7 12/05/2017 0940   EOSABS 0.0 12/05/2017 0940   BASOSABS 0.1 12/05/2017 0940    CMP     Component Value Date/Time   NA 138  12/19/2017 1103   K 4.5 12/19/2017 1103   CL 108 12/19/2017 1103   CO2 24 12/19/2017 1103   GLUCOSE 172 (H) 12/19/2017 1103   BUN 22 12/19/2017 1103   CREATININE 1.34 (H) 12/19/2017 1103   CALCIUM 8.7 (L) 12/19/2017 1103   CALCIUM 10.2 11/23/2017 0910   PROT 5.3 (L) 12/20/2017 1146   ALBUMIN 1.4 (L) 12/20/2017 1146   AST 27 12/20/2017 1146   ALT 21 12/20/2017 1146   ALKPHOS 54 12/20/2017 1146   BILITOT 0.5 12/20/2017 1146   GFRNONAA 34 (L) 12/19/2017 1103   GFRAA 40 (L) 12/19/2017 1103    Lipid Panel     Component Value Date/Time   CHOL 167 09/21/2016 0728   TRIG 118 09/21/2016 0728   HDL 42 09/21/2016 0728   CHOLHDL 4.0 09/21/2016 0728   VLDL 24 09/21/2016 0728   LDLCALC 101 (H) 09/21/2016 0728     Imaging I have reviewed the images obtained:  CT-scan of the brain- mild chronic ischemic white matter with no intracranial abnormalities  MRI examination of the brain- chronic lacunar infarcts in the brainstem bilateral deep gray matter nuclei.  Chronic confluent bilateral cerebral white matter T2 and flair hyper intensity.  Cortical encephalomalacia at the motor strip.  Chronic microhemorrhages in the lateral right thalamus. Diffuse moderate cerebral atrophy, including bilateral moderate to severe hippocampal atrophy.   Etta Quill PA-C [facility] [phone] 12/20/2017, 5:54 PM    Assessment:  84 year old female with significant weight loss and what appears to be failure to thrive. Cognitive impairment noted by primary team this admission.  1. Imaging of the brain shows significant white matter disease with multiple infarcts and diffuse cerebral atrophy, including hippocampal atrophy. These findings in conjunction with her severe cognitive impairment on exam are suggestive of a possible mixed underlying dementing process consisting of a vascular dementia with Alzheimer's disease, with cognitive worsening precipitated by intercurrent illness this admission. 2.  No focal weakness definitively noted on exam, which is compromised by the patient's inability to fully cooperate.  3. Overall impression is that she also has failure to thrive  Recommendations: - Continue to rehydrate - Continue to evaluate nutrition  with nutritionist and manage through NG tube. - Continue to treat UTI  I have seen and examined the patient. I have amended the assessment and recommendations above. Electronically signed: Dr. Kerney Elbe

## 2017-12-20 NOTE — Significant Event (Signed)
Rapid Response Event Note Called by nursing staff for resp distress  Overview: Time Called: 2208 Arrival Time: 2212 Event Type: Respiratory  Initial Focused Assessment: Upon arrival, Ms. Rantz is alert, oriented to only to self, and is using accessory muscles with retractions to breathe.  HR 130-140, 190/107 (131), RR 32 with sats 92% on NRB mask.  BBS diminished with expiratory wheezes. Pt is very anxious and confused. [place] RRT at bedside. Albuterol neb was also given prior to my arrival.  Lamar Blinks NP notified and orders received.  Lasix administered.  Interventions: -NRB mask -STAT PCXR -ABG -Lasix 60 mg IV now -tx SDU for possible BIPAP    Event Summary: Name of Physician Notified: Lamar Blinks NP at 2256    at    Outcome: Transferred (Comment)     [name]

## 2017-12-20 NOTE — Progress Notes (Addendum)
[facility]                                                                              Patient Demographics  [name], is a 84 y.o. female, DOB - [birth year], OFB:[id number]  Admit date - 12/05/2017   Admitting Physician Janora Norlander, MD  Outpatient Primary MD for the patient is Deland Pretty, MD  Outpatient specialists:   LOS - 11  days   Medical records reviewed and are as summarized below:    Chief Complaint  Patient presents with  . Altered Mental Status       Brief summary   [name] is a 84 y.o. femalewith medical history significant forDM2, HTN, HLD, CAD s/p CABG, h/o CVA, CKD3. She presented secondary to dysarthria. MRI significant for no stroke. Also with failure to thrive with PEG tube placed. Working up confusion.    Assessment & Plan    Principal Problem: Acute metabolic encephalopathy -Patient had presented with dysarthria, confusion.  After discussion with the patient's son (physician), he states that patient does not have dementia -MRI of the brain showed chronic advanced atherosclerosis disease, chronic lacunar infarcts however no new infarct. -UA was positive 11/5, had shown more than 100,000 colonies of Enterococcus faecalis.  Repeat urine culture on 11/13, showed no growth - Also has history of achalasia, failure to thrive, not eating well. - LFTs, ammonia level normal, follow UA and culture - still quite confused, discussed with patient's son who is requesting neurology evaluation, neurology consulted  Active Problems:  Anemia: Chronic -Baseline appears to be 7.5-8 -Currently 7.7  - Epo level is slightly above normal     Diabetes mellitus ([place]) -Continue sliding scale insulin, Lantus  Failure to thrive, status post PEG tube -Off TPN -Continue tube feeding -Patient receiving free water, I's and O's reviewed, 27 L positive, stop IV fluids, obtain BNP, may need Lasix   Pressure injury Stage II partial-thickness  with shallow open ulcer, red-pink wound bed with slough, medial sacrum, present on admission Wound care per nursing  Subcutaneous nodule; non-incarcerated umbilical hernia probably lymph node Seen by surgery, and recommended no surgical treatment at this time  Code Status: Full CODE STATUS DVT Prophylaxis:  SCD's Family Communication: Discussed in detail with the patient, all imaging results, lab results explained to the patient's son   Disposition Plan: Discussed with patient's son, does not want skilled nursing facility, wants patient home and he will be staying with the patient  Time Spent in minutes   35 minutes  Procedures:  MRI brain  Consultants:   Palliative medicine Neurology  Antimicrobials:      Medications  Scheduled Meds: . sodium chloride   Intravenous Once  . atorvastatin  40 mg Per Tube q1800  . cholecalciferol  400 Units Per Tube Daily  . feeding supplement (JEVITY 1.5 CAL/FIBER)  237 mL Per Tube QID  . ferrous sulfate  300 mg Per Tube Daily  . free water  100 mL Per Tube Q8H  . insulin aspart  0-15 Units Subcutaneous Q4H  . insulin aspart  3 Units Subcutaneous Q4H  . insulin glargine  20 Units  Subcutaneous QHS  . isosorbide dinitrate  10 mg Per Tube BID  . megestrol  400 mg Oral Daily  . metoprolol tartrate  75 mg Oral BID  . mirtazapine  7.5 mg Per Tube QHS  . multivitamin  15 mL Per Tube Daily  . pantoprazole sodium  40 mg Per Tube BID  . sennosides  5 mL Per Tube QHS   Continuous Infusions: PRN Meds:.acetaminophen, fentaNYL, glucagon (human recombinant), hydrALAZINE, loperamide HCl, nitroGLYCERIN, ondansetron (ZOFRAN) IV, sodium chloride flush   Antibiotics   Anti-infectives (From admission, onward)   Start     Dose/Rate Route Frequency Ordered Stop   12/10/17 0919  ceFAZolin (ANCEF) IVPB 1 g/50 mL premix  Status:  Discontinued     over 30 Minutes Intravenous Continuous PRN 12/10/17 0919 12/17/17 1336   12/10/17 0859  ceFAZolin (ANCEF) 2-4  GM/100ML-% IVPB    Note to Pharmacy:  Margaretmary Dys   : cabinet override      12/10/17 0859 12/10/17 2114   12/08/17 2200  ampicillin (OMNIPEN) 1 g in sodium chloride 0.9 % 100 mL IVPB  Status:  Discontinued     1 g 300 mL/hr over 20 Minutes Intravenous Every 8 hours 12/08/17 1535 12/12/17 1837   12/07/17 0600  ceFAZolin (ANCEF) IVPB 2g/100 mL premix     2 g 200 mL/hr over 30 Minutes Intravenous To Short Stay 12/06/17 1219 12/08/17 0600   12/06/17 1200  cefTRIAXone (ROCEPHIN) 1 g in sodium chloride 0.9 % 100 mL IVPB  Status:  Discontinued     1 g 200 mL/hr over 30 Minutes Intravenous Every 24 hours 12/05/17 1947 12/08/17 1535   12/05/17 1445  cefTRIAXone (ROCEPHIN) 1 g in sodium chloride 0.9 % 100 mL IVPB     1 g 200 mL/hr over 30 Minutes Intravenous  Once 12/05/17 1441 12/05/17 1622        Subjective:   [name] was seen and examined today.  Confused, unable to provide any review of systems, moaning and taking off her clothes, blankets.  Objective:   Vitals:   12/19/17 2047 12/20/17 0500 12/20/17 0843 12/20/17 0850  BP: (!) 161/82  (!) 175/115 (!) 162/92  Pulse: 91  (!) 124 (!) 114  Resp: 18     Temp: 99 F (37.2 C)     TempSrc: Oral     SpO2: 95%  95% 95%  Weight:  54.4 kg      Intake/Output Summary (Last 24 hours) at 12/20/2017 1412 Last data filed at 12/20/2017 1000 Gross per 24 hour  Intake 2257.3 ml  Output -  Net 2257.3 ml     Wt Readings from Last 3 Encounters:  12/20/17 54.4 kg  11/24/17 52.9 kg  10/09/17 59.6 kg     Exam  General: Very confused, moaning  Eyes:   HEENT:   Cardiovascular: S1 S2 auscultated, Regular rate and rhythm.  Respiratory: Decreased breath sounds at the bases  Gastrointestinal: Soft, nontender, nondistended, + bowel sounds  Ext: no pedal edema bilaterally  Neuro: does not follow commands  Musculoskeletal: No digital cyanosis, clubbing  Skin: Stage II sacral ulcer  Psych: very confused   Data Reviewed:  I  have personally reviewed following labs and imaging studies  Micro Results Recent Results (from the past 240 hour(s))  Culture, Urine     Status: None   Collection Time: 12/13/17  6:19 PM  Result Value Ref Range Status   Specimen Description URINE, CLEAN CATCH  Final   Special Requests NONE  Final   Culture   Final    NO GROWTH Performed at [facility], [place] [address], [place], [place] [address]    Report Status 12/14/2017 FINAL  Final    Radiology Reports Ct Head Wo Contrast  Result Date: 12/05/2017 CLINICAL DATA:  Altered level of consciousness. EXAM: CT HEAD WITHOUT CONTRAST TECHNIQUE: Contiguous axial images were obtained from the base of the skull through the vertex without intravenous contrast. COMPARISON:  CT scan of September 20, 2016. FINDINGS: Brain: Mild chronic ischemic white matter disease is noted. No mass effect or midline shift is noted. Ventricular size is within normal limits. There is no evidence of mass lesion, hemorrhage or acute infarction. Vascular: No hyperdense vessel or unexpected calcification. Skull: Normal. Negative for fracture or focal lesion. Sinuses/Orbits: No acute finding. Other: None. IMPRESSION: Mild chronic ischemic white matter disease. No acute intracranial abnormality seen. Electronically Signed   By: Marijo Conception, M.D.   On: 12/05/2017 12:03   Mr Brain Wo Contrast (neuro Protocol)  Result Date: 12/05/2017 CLINICAL DATA:  84 year old female with altered mental status. EXAM: MRI HEAD WITHOUT CONTRAST TECHNIQUE: Multiplanar, multiecho pulse sequences of the brain and surrounding structures were obtained without intravenous contrast. COMPARISON:  Head CT without contrast 1140 hours today. Brain MRI 09/20/2016. FINDINGS: Brain: No restricted diffusion to suggest acute infarction. No midline shift, mass effect, evidence of mass lesion, ventriculomegaly, extra-axial collection or acute intracranial hemorrhage. Cervicomedullary junction and pituitary  are within normal limits. Chronic lacunar infarcts in the brainstem and bilateral deep gray matter nuclei. Chronic confluent bilateral cerebral white matter T2 and FLAIR hyperintensity. Chronic small area of cortical encephalomalacia at the right motor strip. Chronic microhemorrhage in the lateral right thalamus. No new signal abnormality.  Stable cerebral volume since 2018. Vascular: Major intracranial vascular flow voids are stable since 2018. Skull and upper cervical spine: Negative visible cervical spine. Visualized bone marrow signal is within normal limits. Sinuses/Orbits: Stable and negative. Other: Mastoids remain clear. Visible internal auditory structures appear normal. Scalp and face soft tissues appear negative. IMPRESSION: 1.  No acute intracranial abnormality. 2. Advanced chronic ischemic disease appears stable since 2018. Electronically Signed   By: Genevie Ann M.D.   On: 12/05/2017 17:00   Ir Gastrostomy Tube Mod Sed  Result Date: 12/10/2017 INDICATION: 84 year old female with achalasia and inability to eat. She presents for percutaneous gastrostomy tube placement. EXAM: Fluoroscopically guided placement of percutaneous pull-through gastrostomy tube Interventional Radiologist:  Criselda Peaches, MD MEDICATIONS: 2 g Ancef; Antibiotics were administered within 1 hour of the procedure. Additionally, 1 mg glucagon was administered prior to the procedure. ANESTHESIA/SEDATION: Versed 1.5 mg IV; Fentanyl 50 mcg IV Moderate Sedation Time:  14 minutes The patient was continuously monitored during the procedure by the interventional radiology nurse under my direct supervision. CONTRAST:  15 mL Isovue 370 injected into the gastric lumen. FLUOROSCOPY TIME:  Fluoroscopy Time: 2 minutes 48 seconds (4 mGy). COMPLICATIONS: None immediate. PROCEDURE: Informed written consent was obtained from the patient after a thorough discussion of the procedural risks, benefits and alternatives. All questions were addressed.  Maximal Sterile Barrier Technique was utilized including caps, mask, sterile gowns, sterile gloves, sterile drape, hand hygiene and skin antiseptic. A timeout was performed prior to the initiation of the procedure. Maximal barrier sterile technique utilized including caps, mask, sterile gowns, sterile gloves, large sterile drape, hand hygiene, and chlorhexadine skin prep. An angled catheter was advanced over a wire under fluoroscopic guidance through the nose, down the  esophagus and into the body of the stomach. The stomach was then insufflated with several 100 ml of air. Fluoroscopy confirmed location of the gastric bubble, as well as inferior displacement of the barium stained colon. Under direct fluoroscopic guidance, a single T-tack was placed, and the anterior gastric wall drawn up against the anterior abdominal wall. Percutaneous access was then obtained into the mid gastric body with an 18 gauge sheath needle. Aspiration of air, and injection of contrast material under fluoroscopy confirmed needle placement. An Amplatz wire was advanced in the gastric body and the access needle exchanged for a 9-French vascular sheath. A snare device was advanced through the vascular sheath and an Amplatz wire advanced through the angled catheter. The Amplatz wire was successfully snared and this was pulled up through the esophagus and out the mouth. A 20-French Alinda Dooms MIC-PEG tube was then connected to the snare and pulled through the mouth, down the esophagus, into the stomach and out to the anterior abdominal wall. Hand injection of contrast material confirmed intragastric location. The T-tack retention suture was then cut. The pull through peg tube was then secured with the external bumper and capped. The patient will be observed for several hours with the newly placed tube on low wall suction to evaluate for any post procedure complication. The patient tolerated the procedure well, there is no immediate  complication. IMPRESSION: Successful placement of a 20 French pull through gastrostomy tube. Electronically Signed   By: Jacqulynn Cadet M.D.   On: 12/10/2017 09:29   US Abdomen Limited  Result Date: 12/19/2017 CLINICAL DATA:  Periumbilical nodule. EXAM: ULTRASOUND ABDOMEN LIMITED COMPARISON:  CT scan of November 04, 2017. FINDINGS: Limited sonographic evaluation of the periumbilical region demonstrates moderate size hernia. There is solid density within the hernia which may represent bowel loops, although no peristalsis is observed. The possibility of this representing enlarged lymph node or other neoplasm cannot be excluded. IMPRESSION: Moderate size periumbilical hernia is noted. Solid density is noted within the hernia which may represent bowel loop, but enlarged lymph node or neoplasm cannot be excluded. Clinical correlation is recommended. Electronically Signed   By: Marijo Conception, M.D.   On: 12/19/2017 12:44   Dg Chest Port 1 View  Result Date: 12/15/2017 CLINICAL DATA:  Decreased breath sounds at left lung base,hx htn,dm,cad,cardiomyopathy EXAM: PORTABLE CHEST 1 VIEW COMPARISON:  Chest x-rays dated 12/05/2017, 10/06/2016 and 05/14/2010. FINDINGS: Heart size and mediastinal contours are stable. New RIGHT-sided PICC line in place with tip at the level of the lower SVC/cavoatrial junction. Lungs appear clear. No pleural effusion or pneumothorax seen. Median sternotomy wires appear intact and stable in alignment. No acute or suspicious osseous finding. IMPRESSION: No active disease. No evidence of pneumonia or pulmonary edema. RIGHT-sided PICC line appears well positioned with tip at the level of the lower SVC/cavoatrial junction. Electronically Signed   By: Franki Cabot M.D.   On: 12/15/2017 11:10   Dg Chest Port 1 View  Result Date: 12/05/2017 CLINICAL DATA:  Altered mental status, difficulty with speech EXAM: PORTABLE CHEST 1 VIEW COMPARISON:  Portable chest x-ray of 11/09/2016 FINDINGS: No  active infiltrate or effusion is seen. Mediastinal and hilar contours are unremarkable. Mild cardiomegaly is stable as is the ectatic descending thoracic aorta. Posttraumatic degenerative change of the right humeral neck is again noted. IMPRESSION: 1. No active lung disease. 2. Stable mild cardiomegaly. Electronically Signed   By: Ivar Drape M.D.   On: 12/05/2017 10:00   [place] Ekg Site  Rite  Result Date: 12/09/2017 If Site Rite image not attached, placement could not be confirmed due to current cardiac rhythm.   Lab Data:  CBC: Recent Labs  Lab 12/15/17 0423 12/16/17 1419 12/18/17 1344 12/19/17 1103 12/20/17 0400  WBC 9.9 8.4 10.8* 10.4 8.8  HGB 8.0* 7.6* 7.1* 7.6* 7.7*  HCT 25.9* 25.7* 23.6* 24.3* 25.2*  MCV 90.6 92.1 92.2 91.0 91.6  PLT 238 269 255 284 935   Basic Metabolic Panel: Recent Labs  Lab 12/13/17 1749 12/14/17 0958 12/15/17 0423 12/16/17 1419 12/19/17 1103  NA 134* 134* 136 138 138  K 4.7 4.0 4.1 4.2 4.5  CL 103 106 106 108 108  CO2 25 24 25 26 24   GLUCOSE 482* 256* 266* 225* 172*  BUN 22 21 22 22 22   CREATININE 1.29* 1.22* 1.32* 1.31* 1.34*  CALCIUM 9.1 8.4* 8.4* 8.9 8.7*  MG  --  1.8  --   --   --   PHOS 2.1* 1.8* 1.9* 2.2* 3.2   GFR: Estimated Creatinine Clearance: 24 mL/min (A) (by C-G formula based on SCr of 1.34 mg/dL (H)). Liver Function Tests: Recent Labs  Lab 12/14/17 7017 12/15/17 0423 12/16/17 1419 12/19/17 1103 12/19/17 1737 12/20/17 1146  AST  --   --   --   --   --  27  ALT  --   --   --   --   --  21  ALKPHOS  --   --   --   --   --  54  BILITOT  --   --   --   --  0.5 0.5  PROT  --   --   --   --   --  5.3*  ALBUMIN 1.5* 1.5* 1.5* 1.5*  --  1.4*   No results for input(s): LIPASE, AMYLASE in the last 168 hours. Recent Labs  Lab 12/20/17 1146  AMMONIA 11   Coagulation Profile: No results for input(s): INR, PROTIME in the last 168 hours. Cardiac Enzymes: No results for input(s): CKTOTAL, CKMB, CKMBINDEX, TROPONINI in the last  168 hours. BNP (last 3 results) No results for input(s): PROBNP in the last 8760 hours. HbA1C: No results for input(s): HGBA1C in the last 72 hours. CBG: Recent Labs  Lab 12/19/17 2048 12/20/17 0032 12/20/17 0456 12/20/17 0750 12/20/17 1122  GLUCAP 154* 164* 148* 128* 153*   Lipid Profile: No results for input(s): CHOL, HDL, LDLCALC, TRIG, CHOLHDL, LDLDIRECT in the last 72 hours. Thyroid Function Tests: No results for input(s): TSH, T4TOTAL, FREET4, T3FREE, THYROIDAB in the last 72 hours. Anemia Panel: No results for input(s): VITAMINB12, FOLATE, FERRITIN, TIBC, IRON, RETICCTPCT in the last 72 hours. Urine analysis:    Component Value Date/Time   COLORURINE YELLOW 12/13/2017 1642   APPEARANCEUR HAZY (A) 12/13/2017 1642   LABSPEC 1.016 12/13/2017 1642   PHURINE 7.0 12/13/2017 1642   GLUCOSEU >=500 (A) 12/13/2017 1642   HGBUR MODERATE (A) 12/13/2017 1642   BILIRUBINUR NEGATIVE 12/13/2017 1642   KETONESUR NEGATIVE 12/13/2017 1642   PROTEINUR 100 (A) 12/13/2017 1642   UROBILINOGEN 0.2 06/30/2011 1214   NITRITE NEGATIVE 12/13/2017 1642   LEUKOCYTESUR MODERATE (A) 12/13/2017 1642     Rondalyn Belford M.D. [facility] 12/20/2017, 2:12 PM  Pager: [phone] Between 7am to 7pm - call Pager - [phone]  After 7pm go to [email or web address] - password [email or web address]  Call night coverage person covering after 7pm

## 2017-12-20 NOTE — Progress Notes (Signed)
PT Cancellation Note  Patient Details Name: [name] MRN: [id number] DOB: 05/13/1927   Cancelled Treatment:    Reason Eval/Treat Not Completed: Medical issues which prohibited therapy. Discussed pt case with RN who states pt with elevated BP, elevated HR, and labored breathing. Will hold PT at this time and check back for medical readiness to participate.    Thelma Comp 12/20/2017, 11:23 AM   Rolinda Roan, PT, DPT Acute Rehabilitation Services Pager: [phone] Office: [phone]

## 2017-12-20 NOTE — Progress Notes (Signed)
Nutrition Follow-up  DOCUMENTATION CODES:   Non-severe (moderate) malnutrition in context of chronic illness, Underweight  INTERVENTION:   Tube Feeding:  Transition to bolus feedings as plan for discharge to home Jevity 1.5 237 mL (1 can) QID  Provides 1420 kcals, 60 g of protein and 720 mL of free water  Recommend free water flushes of 250 mL TID; total free water 1470 mL per day  Recommend considering discontinuing appetite stimulant, Megace, if pt is to remain NPO  NUTRITION DIAGNOSIS:   Moderate Malnutrition related to chronic illness(dysphagia 2/2 to achalasia) as evidenced by energy intake < or equal to 75% for > or equal to 1 month, mild fat depletion, moderate fat depletion, mild muscle depletion, moderate muscle depletion, energy intake < or equal to 50% for > or equal to 1 month.  Being addressed via TF   GOAL:   Patient will meet greater than or equal to 90% of their needs  Met  MONITOR:   PO intake, Supplement acceptance, Labs, Weight trends, Skin, I & O's  REASON FOR ASSESSMENT:   Consult Assessment of nutrition requirement/status, Poor PO  ASSESSMENT:   [name] is a 84 y.o. female with medical history significant for DM2, HTN, HLD, CAD s/p CABG, h/o CVA, CKD3, who was recently diagnosed with achalasia after a 50 lb weight loss over the past year. She was treated with botox earlier in October and sent to SNF with the hopes she would be able to swallow her food. She has not done well since that time, has only been able to drink small amounts of milk. Son is a Management consultant in [place] and has been visiting. Yesterday he noticed she was stuttering, having difficulty getting words out, and progressed until she seemed to have total aphasia last night. She does report having dysuria and some lower abd pain. No N/V. Has a good appetite but avoids food because of the obstruction. Megace and remeron have not helped. Process of being worked up for G tube has been  initiated and pt reportedly has appt with [facility] tomorrow morning. Son feels like her weight loss and FTT could be reversible if she gets a G tube as she was reportedly very functional a month ago, walking with a walker and performing all of her ADLs.   Noted plan to discharge home with son, possibly tomorrow. Discussed plan for transition to bolus feeds with MD, MD agreeable. Pt will not require feeding pump with bolus feedings  Pt tolerating Jevity 1.2 @ 55 ml/hr via G-tube  Weight up since admission; current wt 54.4 kg. Weight of 46.6 kg on admission. Significantly neg positive since admission per I/O flow sheet (+28 L); however UOP volume not measured.   Labs: CBGs 128-164; no BMP Meds: ferrous sulfate, cholecalciferol, ss novolog, novolog q 4 hours, lantus, megace  Diet Order:   Diet Order    None      EDUCATION NEEDS:   Education needs have been addressed  Skin:  Skin Integrity Issues:: Stage II, Other (Comment) Stage II: coccyx Other: partial thickness wounds to bilateral buttocks (likely from adhesive)  Last BM:  11/20  Height:   Ht Readings from Last 1 Encounters:  11/20/17 _0  (1.6 m)    Weight:   Wt Readings from Last 1 Encounters:  12/20/17 54.4 kg    Ideal Body Weight:  52.3 kg  BMI:  Body mass index is 21.26 kg/m.  Estimated Nutritional Needs:   Kcal:  1450-1650  Protein:  60-75 grams  Fluid:  > 1.4 L   Kerman Passey MS, RD, LDN, CNSC [phone] Pager  [phone] Weekend/On-Call Pager

## 2017-12-21 ENCOUNTER — Inpatient Hospital Stay (HOSPITAL_COMMUNITY): Payer: Medicare Other

## 2017-12-21 ENCOUNTER — Other Ambulatory Visit: Payer: Self-pay

## 2017-12-21 DIAGNOSIS — N183 Chronic kidney disease, stage 3 (moderate): Secondary | ICD-10-CM

## 2017-12-21 DIAGNOSIS — R471 Dysarthria and anarthria: Secondary | ICD-10-CM

## 2017-12-21 DIAGNOSIS — I5033 Acute on chronic diastolic (congestive) heart failure: Secondary | ICD-10-CM

## 2017-12-21 DIAGNOSIS — I2510 Atherosclerotic heart disease of native coronary artery without angina pectoris: Secondary | ICD-10-CM

## 2017-12-21 DIAGNOSIS — E1159 Type 2 diabetes mellitus with other circulatory complications: Secondary | ICD-10-CM

## 2017-12-21 DIAGNOSIS — I340 Nonrheumatic mitral (valve) insufficiency: Secondary | ICD-10-CM

## 2017-12-21 DIAGNOSIS — I214 Non-ST elevation (NSTEMI) myocardial infarction: Secondary | ICD-10-CM

## 2017-12-21 DIAGNOSIS — I10 Essential (primary) hypertension: Secondary | ICD-10-CM

## 2017-12-21 LAB — TROPONIN I
Troponin I: 8.87 ng/mL (ref <0.03)
Troponin I: 12.17 ng/mL (ref <0.03)
Troponin I: 10.94 ng/mL (ref <0.03)

## 2017-12-21 LAB — BASIC METABOLIC PANEL
Anion gap: 8 (ref 5–15)
BUN: 24 mg/dL — ABNORMAL HIGH (ref 8–23)
CHLORIDE: 107 mmol/L (ref 98–111)
CO2: 25 mmol/L (ref 22–32)
Calcium: 9.1 mg/dL (ref 8.9–10.3)
Creatinine, Ser: 1.34 mg/dL — ABNORMAL HIGH (ref 0.44–1.00)
GFR calc Af Amer: 40 mL/min — ABNORMAL LOW (ref >60)
GFR calc non Af Amer: 34 mL/min — ABNORMAL LOW (ref >60)
GLUCOSE: 58 mg/dL — AB (ref 70–99)
POTASSIUM: 3.7 mmol/L (ref 3.5–5.1)
Sodium: 140 mmol/L (ref 135–145)

## 2017-12-21 LAB — CBC
HEMATOCRIT: 26.7 % — AB (ref 36.0–46.0)
HEMOGLOBIN: 8 g/dL — AB (ref 12.0–15.0)
MCH: 27.6 pg (ref 26.0–34.0)
MCHC: 30 g/dL (ref 30.0–36.0)
MCV: 92.1 fL (ref 80.0–100.0)
Platelets: 356 10*3/uL (ref 150–400)
RBC: 2.9 MIL/uL — ABNORMAL LOW (ref 3.87–5.11)
RDW: 18.6 % — ABNORMAL HIGH (ref 11.5–15.5)
WBC: 11.7 10*3/uL — AB (ref 4.0–10.5)
nRBC: 0 % (ref 0.0–0.2)

## 2017-12-21 LAB — GLUCOSE, CAPILLARY
Glucose-Capillary: 117 mg/dL — ABNORMAL HIGH (ref 70–99)
Glucose-Capillary: 126 mg/dL — ABNORMAL HIGH (ref 70–99)
Glucose-Capillary: 173 mg/dL — ABNORMAL HIGH (ref 70–99)
Glucose-Capillary: 176 mg/dL — ABNORMAL HIGH (ref 70–99)
Glucose-Capillary: 240 mg/dL — ABNORMAL HIGH (ref 70–99)
Glucose-Capillary: 259 mg/dL — ABNORMAL HIGH (ref 70–99)
Glucose-Capillary: 48 mg/dL — ABNORMAL LOW (ref 70–99)
Glucose-Capillary: 67 mg/dL — ABNORMAL LOW (ref 70–99)
Glucose-Capillary: 71 mg/dL (ref 70–99)

## 2017-12-21 LAB — ECHOCARDIOGRAM COMPLETE
HEIGHTINCHES: 63 in
Weight: 2032 oz

## 2017-12-21 LAB — HEPARIN LEVEL (UNFRACTIONATED): Heparin Unfractionated: 0.39 IU/mL (ref 0.30–0.70)

## 2017-12-21 MED ORDER — POTASSIUM CHLORIDE 20 MEQ/15ML (10%) PO SOLN
20.0000 meq | Freq: Two times a day (BID) | ORAL | Status: DC
  Administered 2017-12-21 – 2017-12-26 (×11): 20 meq
  Filled 2017-12-21 (×12): qty 15

## 2017-12-21 MED ORDER — FUROSEMIDE 10 MG/ML IJ SOLN
40.0000 mg | Freq: Two times a day (BID) | INTRAMUSCULAR | Status: DC
  Administered 2017-12-21: 40 mg via INTRAVENOUS
  Filled 2017-12-21: qty 4

## 2017-12-21 MED ORDER — INSULIN ASPART 100 UNIT/ML ~~LOC~~ SOLN: 0.0000 [IU] | Freq: Three times a day (TID) | SUBCUTANEOUS | Status: DC

## 2017-12-21 MED ORDER — HEPARIN (PORCINE) 25000 UT/250ML-% IV SOLN
1200.0000 [IU]/h | INTRAVENOUS | Status: DC
  Administered 2017-12-21: 700 [IU]/h via INTRAVENOUS
  Administered 2017-12-22: 900 [IU]/h via INTRAVENOUS
  Filled 2017-12-21 (×2): qty 250

## 2017-12-21 MED ORDER — ALTEPLASE 2 MG IJ SOLR
2.0000 mg | Freq: Once | INTRAMUSCULAR | Status: AC
  Administered 2017-12-21: 2 mg
  Filled 2017-12-21: qty 2

## 2017-12-21 MED ORDER — INSULIN ASPART 100 UNIT/ML ~~LOC~~ SOLN
0.0000 [IU] | Freq: Four times a day (QID) | SUBCUTANEOUS | Status: DC
  Administered 2017-12-21: 8 [IU] via SUBCUTANEOUS
  Administered 2017-12-21 – 2017-12-22 (×2): 5 [IU] via SUBCUTANEOUS
  Administered 2017-12-22: 3 [IU] via SUBCUTANEOUS
  Administered 2017-12-22: 8 [IU] via SUBCUTANEOUS
  Administered 2017-12-22: 3 [IU] via SUBCUTANEOUS
  Administered 2017-12-23: 8 [IU] via SUBCUTANEOUS
  Administered 2017-12-23: 11 [IU] via SUBCUTANEOUS
  Administered 2017-12-23: 5 [IU] via SUBCUTANEOUS

## 2017-12-21 MED ORDER — CLOPIDOGREL BISULFATE 75 MG PO TABS
75.0000 mg | ORAL_TABLET | Freq: Every day | ORAL | Status: DC
  Administered 2017-12-21 – 2017-12-22 (×2): 75 mg via ORAL
  Filled 2017-12-21 (×3): qty 1

## 2017-12-21 MED ORDER — FUROSEMIDE 40 MG PO TABS
40.0000 mg | ORAL_TABLET | Freq: Every day | ORAL | Status: DC
  Administered 2017-12-21: 40 mg via ORAL
  Filled 2017-12-21 (×2): qty 1

## 2017-12-21 MED ORDER — INSULIN ASPART 100 UNIT/ML ~~LOC~~ SOLN
3.0000 [IU] | Freq: Four times a day (QID) | SUBCUTANEOUS | Status: DC
  Administered 2017-12-21 – 2017-12-23 (×10): 3 [IU] via SUBCUTANEOUS

## 2017-12-21 MED ORDER — FUROSEMIDE 10 MG/ML IJ SOLN
40.0000 mg | Freq: Two times a day (BID) | INTRAMUSCULAR | Status: AC
  Administered 2017-12-21 – 2017-12-22 (×3): 40 mg via INTRAVENOUS
  Filled 2017-12-21 (×3): qty 4

## 2017-12-21 NOTE — Progress Notes (Addendum)
Shift event note: Notified by RN initially that pt was having audible wheezes and reporting SOB. Orders placed for PCXR and an Albuterol/Atrovent breathing tx. PCXR findings c/w elvolving pulmonary edema. At this time pt remains tachpnic having had minimal relief w/ neb tx. 02 sats dropped to 70's at one point but returned to baseline when placed on NRB. Lasix 60 mg was ordered IV. After Lasix given RR RN was paged and responded to bedside as this NP was unable to respond to bedside at this time. ABG obtained and revealed mild respiratory alkalosis. BNP noted to be > 4,500.00. Read Drivers, RN  w/ RR noted pt appears to be somewhat improved after IV lasix but remains somewhat tachypnic. Sats now 100% on NRB. He recommended transfer to SDU overnight in case pt would need to progress to Bipap.  Assessment/Plan:  1. Acute hypoxic respiratory failure: In setting of pt admitted for acute metabolic encephalopathy, chronic anemia, DM, FTT and pressure injury. H/o CAD s/p CABG, HTN, HLD, CVA and CKD III. CXR c/w pulmonary edema. Responded well to lasix but remains tachypnic. Will transfer to SDU for closer monitoring overnight. Will progress to BiPAP as indicated. Low threshold to consult CCm if pt deteriorates further.   Jeryl Columbia, NP-C [facility] Pager [phone]  Addendum: (0230) Pt resting comfortably now w/o acute distress. 02 sats 100% on 4L . Did not require BiPAP. Will continue to monitor closley on SDU.  Addendum: (0530) Notified by RN regarding first troponin result of 8.87. Initial EKG was w/o ischemic changes (also reviewed by Dr Hal Hope who was in agreement). Pt has had no c/o CP however she has been confused at baseline and not a reliable historian. Discussed pt w/ Dr Radford Pax w/ cardiology service who has agreed to have cardiology service see pt this am. Will start IV Heparin given concern for NSTEMI and continue to trend troponins. Will continue to monitor closely on SDU.

## 2017-12-21 NOTE — Progress Notes (Signed)
Inpatient Diabetes Program Recommendations  AACE/ADA: New Consensus Statement on Inpatient Glycemic Control (2015)  Target Ranges:  Prepandial:   less than 140 mg/dL      Peak postprandial:   less than 180 mg/dL (1-2 hours)      Critically ill patients:  140 - 180 mg/dL   Lab Results  Component Value Date   GLUCAP 67 (L) 12/21/2017   HGBA1C 9.0 (H) 11/04/2017    Review of Glycemic ControlResults for Bergemann, DARTHA (MRN [id number]) as of 12/21/2017 11:16  Ref. Range 12/20/2017 20:20 12/21/2017 00:15 12/21/2017 03:48 12/21/2017 07:49 12/21/2017 08:51  Glucose-Capillary Latest Ref Range: 70 - 99 mg/dL 192 (H) 176 (H) 71 48 (L) 67 (L)   Diabetes history: Type 2 DM Outpatient Diabetes medications: Glipizide 10 mg QAM, Trulicity 1.5 mg Q Sunday Current orders for Inpatient glycemic control: Lantus 20 units qhs, Novolog 0-15 units Q4H, Novolog 3 units q4 hours  Jevity 237 ml qid- per tube Inpatient Diabetes Program Recommendations:    Note low blood sugar this AM.  RN states that she administered Glucagon 1mg  X 1 this AM due to low blood sugar.  Patient is now on Tube feed boluses four times a day.  Will need to change Novolog/tube feed coverage to match up with feeds. Discussed with Dr. Tana Coast.  Verbal order obtained.    Thanks,  Adah Perl, RN, BC-ADM Inpatient Diabetes Coordinator Pager [phone] (8a-5p)

## 2017-12-21 NOTE — Progress Notes (Signed)
  Echocardiogram 2D Echocardiogram has been performed.  [name] M 12/21/2017, 3:11 PM

## 2017-12-21 NOTE — Consult Note (Signed)
Cardiology Consultation:   [name]; [id number]; 04-25-1927   Admit date: 12/05/2017 Date of Consult: 12/21/2017  Primary Care Provider: Deland Pretty, MD Primary Cardiologist: Dr. Minus Breeding, MD   Patient Profile:   [name] is a 84 y.o. female with a hx of MI/CABG 2011, CKD III (followed byDr [name]), HTN, hyperlipidemia, DM 2,CVA on 09/19/2016 w/ subacute infarction involving the right posterior limb of internal capsule and lateral thalamus and chronic combined systolic diastolic CHF who is being seen today for the evaluation of elevated troponin at the request of Dr. Tana [name].  History of Present Illness:   [name] is an 84yo F with a hx as stated above who initially presented from SNF to [facility] on 12/05/17 with AMS who was recently dx with achalasia after a 50 lb weight loss and failure to thrive over the past year. History obtained from chart review given patient AMS and no family present at bedside. Per chart review, she was treated with botox earlier in October and sent to SNF with the hopes she would be able to swallow her food. She has not done well since that time. Son was visiting from [place] and noticed that the patient was stuttering wither her speech and having difficulty with words one day prior to admission. She progressed to total aphasia on day of presentation. She was noted to be in the process of being worked up for G-tube and pt reportedly has an upcoming appt with [facility]. Apparently she was relatively functional a month ago, walking with a walker and performing all of her ADLs.     In the ED, she was given IVF hydration. UA performed showed UTI. Given her AMS, she underwent an MRI which was negative for acute CVA. Her creatinine was noted to be elevated from her baseline. She was tachycardiac on telemetry.  During her hospital course, palliative care medicine was consulted for goals of care discussion with the family who wish to continue full treatment.  Plans were initially made to have her return to the SNF, however her son is now wanting to take her home. Case management is following closely. Son had also requested that neurology consult who reviewed her neuro imaging and feel that findings in conjunction with her severe cognitive impairment on exam are suggestive of a possible mixed underlying dementing process consisting of a vascular dementia with Alzheimer's disease, with cognitive worsening precipitated by intercurrent illness. There is clear evidence of a failure to thrive picture in this scenario.Recommedations were made to continue with hydration, nutritional management and continued treatment of her UTI.   Unfortunately on 12/21/17 she had an episode of acute respiratory distress. Internal medicine NP was called to bedside. CXR was performed which showed elvolving pulmonary edema. She was given 60mg  IV Lasix . BNP was noted to be markedly elevated at >4,500. She had improvement with the Lasix and appears to be resting comfortably now.  A troponin was drawn which was found to be significantly elevated at 8.87>12.17. Given this, cardiology has been consulted. On my exam, she is unable to answer my questions regarding chest pain. She appears comfortable. Will discuss case further with MD>to follow.    Of note, we were last consulted during a recent hospitalization on 11/09/17 for chest pain and SOB. Last echocardiogram August 2018 showed LV function of 55 to 88%, grade 1 diastolic dysfunction, mild mitral regurgitation and mild left atrial dilation. At that time she was having constant burning sensation at the  epigastric area radiating to her throat with chest pressure with shortness of breath the prior evening lasting for 10 minutes in duration. EKG showed sinus tachycardia at rate of 130 bpm with rate improvement after BB. BNP  was noted to be 1407 and troponin elevated at 0.27>>0.5. Chest radiograph showed cardiomegaly with perihilar/interstitial  edema. At sign off, her amlodipine was decreased and her beta-blocker was increased with no recommendations for further testing at that time.  She last saw [name] 03/2017 and was doing relatively well.   Past Medical History:  Diagnosis Date  . Acute renal injury ([place]) 07/2016  . Anginal pain ([place]) 2004; 2011  . CAD (coronary artery disease)    status post stenting of the RCA status post CABG. (left internal mammary artery to  LAD, saphenous vein graft to second diagonal, saphenous vein  graft to obtuse marginal 1, saphenous vein graft to posterior  descending).  . Cardiomyopathy     Mildly reduced EF (45%).   . Chronic kidney disease (CKD), stage III (moderate) ([place])   . History of blood transfusion 1960s   "related to anemia"  . HTN (hypertension)   . Pleural effusion 2011   S/P "bypass"  . Shortness of breath   . Stroke ([place]) 09/19/2016   "left sided weakness" (09/20/2016)  . Type II diabetes mellitus ([place])     Past Surgical History:  Procedure Laterality Date  . BOTOX INJECTION  11/20/2017   Procedure: BOTOX INJECTION;  Surgeon: Doran Stabler, MD;  Location: [facility];  Service: Gastroenterology;;  . CATARACT EXTRACTION W/ INTRAOCULAR LENS  IMPLANT, BILATERAL Bilateral   . CORONARY ANGIOPLASTY WITH STENT PLACEMENT  2004   "LAD & one of the circumflex"  . CORONARY ARTERY BYPASS GRAFT  2011   CABG X4  . ESOPHAGOGASTRODUODENOSCOPY (EGD) WITH PROPOFOL N/A 11/20/2017   Procedure: ESOPHAGOGASTRODUODENOSCOPY (EGD) WITH PROPOFOL;  Surgeon: Doran Stabler, MD;  Location: [facility];  Service: Gastroenterology;  Laterality: N/A;  . IR GASTROSTOMY TUBE MOD SED  12/10/2017     Prior to Admission medications   Medication Sig Start Date End Date Taking? Authorizing Provider  acetaminophen (TYLENOL) 500 MG tablet Take 500 mg by mouth every 6 (six) hours as needed for headache.    Yes [provider]  amLODipine (NORVASC) 10 MG tablet TAKE ONE TABLET BY MOUTH  ONCE DAILY. Patient taking differently: Take 10 mg by mouth daily.  07/27/17  Yes Darlene Breeding, MD  atorvastatin (LIPITOR) 40 MG tablet Take 1 tablet (40 mg total) by mouth daily. Patient taking differently: Take 40 mg by mouth daily at 6 PM.  04/25/17  Yes Garvin Fila, MD  cholecalciferol (VITAMIN D) 400 units TABS tablet Take 1 tablet (400 Units total) by mouth daily. 10/06/16  Yes Angiulli, Lavon Paganini, PA-C  ferrous sulfate 220 (44 Fe) MG/5ML solution Take 10 mLs by mouth daily. 10/25/17  Yes [provider]  glipiZIDE (GLUCOTROL) 5 MG tablet Take 2 tablets (10 mg total) by mouth daily before breakfast. 11/24/17  Yes Nita Sells, MD  isosorbide mononitrate (IMDUR) 30 MG 24 hr tablet TAKE 1 TABLET BY MOUTH ONCE DAILY Patient taking differently: Take 30 mg by mouth daily.  09/22/17  Yes Darlene Breeding, MD  megestrol (MEGACE) 20 MG tablet Take 20 mg by mouth at bedtime.   Yes [provider]  metoprolol tartrate 75 MG TABS Take 75 mg by mouth 2 (two) times daily. 11/24/17  Yes Nita Sells, MD  mirtazapine (REMERON)  15 MG tablet Take 7.5 mg by mouth at bedtime.   Yes [provider]  nitroGLYCERIN (NITROSTAT) 0.4 MG SL tablet Place 1 tablet (0.4 mg total) under the tongue every 5 (five) minutes x 3 doses as needed for chest pain. 10/08/16  Yes Rosita Fire, Brittainy M, PA-C  pantoprazole (PROTONIX) 40 MG tablet Take 1 tablet (40 mg total) by mouth 2 (two) times daily. 11/24/17  Yes Nita Sells, MD  polyethylene glycol (MIRALAX / GLYCOLAX) packet Take 17 g by mouth daily. 10/16/16  Yes Regalado, Belkys A, MD  senna (SENOKOT) 8.6 MG TABS tablet Take 1 tablet (8.6 mg total) by mouth daily. Patient taking differently: Take 1 tablet by mouth at bedtime.  10/16/16  Yes Regalado, Belkys A, MD  feeding supplement, ENSURE ENLIVE, (ENSURE ENLIVE) LIQD Take 237 mLs by mouth 2 (two) times daily between meals. Patient not taking: Reported on 12/05/2017 11/24/17    Nita Sells, MD  megestrol (MEGACE) 400 MG/10ML suspension Take 10 mLs (400 mg total) by mouth daily. Patient not taking: Reported on 12/05/2017 11/24/17   Nita Sells, MD  protein supplement shake (PREMIER PROTEIN) LIQD Take 325 mLs (11 oz total) by mouth daily. Patient not taking: Reported on 11/04/2017 10/15/16   Niel Hummer A, MD    Inpatient Medications: Scheduled Meds: . sodium chloride   Intravenous Once  . albuterol      . atorvastatin  40 mg Per Tube q1800  . cholecalciferol  400 Units Per Tube Daily  . feeding supplement (JEVITY 1.5 CAL/FIBER)  237 mL Per Tube QID  . ferrous sulfate  300 mg Per Tube Daily  . free water  250 mL Per Tube Q8H  . furosemide  40 mg Intravenous Q12H  . insulin aspart  0-15 Units Subcutaneous Q4H  . insulin aspart  3 Units Subcutaneous Q4H  . insulin glargine  20 Units Subcutaneous QHS  . isosorbide dinitrate  10 mg Per Tube BID  . metoprolol tartrate  75 mg Oral BID  . mirtazapine  7.5 mg Per Tube QHS  . multivitamin  15 mL Per Tube Daily  . pantoprazole sodium  40 mg Per Tube BID  . sennosides  5 mL Per Tube QHS   Continuous Infusions: . heparin 700 Units/hr (12/21/17 0644)   PRN Meds: acetaminophen, fentaNYL, glucagon (human recombinant), hydrALAZINE, loperamide HCl, nitroGLYCERIN, ondansetron (ZOFRAN) IV, sodium chloride flush  Allergies:    Allergies  Allergen Reactions  . Contrast Media [Iodinated Diagnostic Agents]     Sensitivity, kidney issues    Social History:   Social History   Socioeconomic History  . Marital status: Married    Spouse name: Not on file  . Number of children: 2  . Years of education: Not on file  . Highest education level: Not on file  Occupational History  . Occupation: Retired  Scientific laboratory technician  . Financial resource strain: Not on file  . Food insecurity:    Worry: Not on file    Inability: Not on file  . Transportation needs:    Medical: Not on file    Non-medical: Not on  file  Tobacco Use  . Smoking status: Never Smoker  . Smokeless tobacco: Never Used  Substance and Sexual Activity  . Alcohol use: No  . Drug use: No  . Sexual activity: Not Currently  Lifestyle  . Physical activity:    Days per week: Not on file    Minutes per session: Not on file  . Stress: Not on  file  Relationships  . Social connections:    Talks on phone: Not on file    Gets together: Not on file    Attends religious service: Not on file    Active member of club or organization: Not on file    Attends meetings of clubs or organizations: Not on file    Relationship status: Not on file  . Intimate partner violence:    Fear of current or ex partner: Not on file    Emotionally abused: Not on file    Physically abused: Not on file    Forced sexual activity: Not on file  Other Topics Concern  . Not on file  Social History Narrative  . Not on file    Family History:   Family History  Problem Relation Age of Onset  . Diabetes Father 58  . Unexplained death Mother 39  . CAD Neg Hx    Family Status:  Family Status  Relation Name Status  . Father  Deceased  . Mother  Deceased  . MGM  Deceased  . MGF  Deceased  . PGM  Deceased  . PGF  Deceased  . Neg Hx  (Not Specified)    ROS:  Please see the history of present illness.  All other ROS reviewed and negative.     Physical Exam/Data:   Vitals:   12/20/17 2301 12/20/17 2350 12/21/17 0355 12/21/17 0755  BP: (!) 172/97 (!) 166/79 (!) 141/94 (!) 172/84  Pulse: (!) 121 (!) 113 100   Resp: 18 20 15 14   Temp:  97.6 F (36.4 C) 97.6 F (36.4 C) 99.4 F (37.4 C)  TempSrc:  Oral Oral Axillary  SpO2: 100% 100% 99%   Weight:  57.6 kg    Height:  5\' 3"  (1.6 m)      Intake/Output Summary (Last 24 hours) at 12/21/2017 0826 Last data filed at 12/21/2017 0647 Gross per 24 hour  Intake 720.74 ml  Output 1200 ml  Net -479.26 ml   Filed Weights   12/17/17 2023 12/20/17 0500 12/20/17 2350  Weight: 54 kg 54.4 kg 57.6  kg   Body mass index is 22.5 kg/m.   General: Frail, ill-appearing, NAD Skin: Warm, dry, intact  Head: Normocephalic, atraumatic, clear, moist mucus membranes. Neck: Negative for carotid bruits. No JVD Lungs: Diminished with bilateral crackles. No wheezes. Breathing is unlabored. Cardiovascular: RRR with S1 S2. No murmurs, rubs, gallops, or LV heave appreciated. Abdomen: Soft, non-distended. No obvious abdominal masses. Tube placement  MSK: Strength and tone appears decreased for age. MAE Extremities: No edema. No clubbing or cyanosis. DP/PT pulses 1+ bilaterally Neuro: Not alert. Unable to answer questions. MAE spontaneously. Psych: Does not respond to questioning appropriately.    EKG:  The EKG was personally reviewed and demonstrates: 12/21/17 NSR with non-specific T wave abnormalities, no change from admission tracing  Telemetry:  Telemetry was personally reviewed and demonstrates: 12/21/17 ST HR 101  Relevant CV Studies:  Echo 09/21/16 Study Conclusions  - Left ventricle: The cavity size was normal. Wall thickness was increased in a pattern of mild LVH. Systolic function was normal. The estimated ejection fraction was in the range of 55% to 60%. Wall motion was normal; there were no regional wall motion abnormalities. Doppler parameters are consistent with abnormal left ventricular relaxation (grade 1 diastolic dysfunction). - Mitral valve: There was mild regurgitation. - Left atrium: The atrium was mildly dilated. - Atrial septum: No defect or patent foramen ovale was identified.  Impressions:  -  No cardiac source of emboli was indentified.  CATH: Not on file   Laboratory Data:  Chemistry Recent Labs  Lab 12/16/17 1419 12/19/17 1103 12/21/17 0608  NA 138 138 140  K 4.2 4.5 3.7  CL 108 108 107  CO2 26 24 25   GLUCOSE 225* 172* 58*  BUN 22 22 24*  CREATININE 1.31* 1.34* 1.34*  CALCIUM 8.9 8.7* 9.1  GFRNONAA 35* 34* 34*  GFRAA 41* 40* 40*    ANIONGAP 4* 6 8    Total Protein  Date Value Ref Range Status  12/20/2017 5.3 (L) 6.5 - 8.1 g/dL Final   Albumin  Date Value Ref Range Status  12/20/2017 1.4 (L) 3.5 - 5.0 g/dL Final   AST  Date Value Ref Range Status  12/20/2017 27 15 - 41 U/L Final   ALT  Date Value Ref Range Status  12/20/2017 21 0 - 44 U/L Final   Alkaline Phosphatase  Date Value Ref Range Status  12/20/2017 54 38 - 126 U/L Final   Total Bilirubin  Date Value Ref Range Status  12/20/2017 0.5 0.3 - 1.2 mg/dL Final   Hematology Recent Labs  Lab 12/19/17 1103 12/20/17 0400 12/21/17 0608  WBC 10.4 8.8 11.7*  RBC 2.67* 2.75* 2.90*  HGB 7.6* 7.7* 8.0*  HCT 24.3* 25.2* 26.7*  MCV 91.0 91.6 92.1  MCH 28.5 28.0 27.6  MCHC 31.3 30.6 30.0  RDW 18.5* 18.6* 18.6*  PLT 284 300 356   Cardiac Enzymes Recent Labs  Lab 12/21/17 0316 12/21/17 0608  TROPONINI 8.87* 12.17*   No results for input(s): TROPIPOC in the last 168 hours.  BNP Recent Labs  Lab 12/20/17 1450  BNP >4,500.0*    DDimer No results for input(s): DDIMER in the last 168 hours. TSH:  Lab Results  Component Value Date   TSH 0.854 11/05/2017   Lipids: Lab Results  Component Value Date   CHOL 167 09/21/2016   HDL 42 09/21/2016   LDLCALC 101 (H) 09/21/2016   TRIG 118 09/21/2016   CHOLHDL 4.0 09/21/2016   HgbA1c: Lab Results  Component Value Date   HGBA1C 9.0 (H) 11/04/2017    Radiology/Studies:  US Abdomen Limited  Result Date: 12/19/2017 CLINICAL DATA:  Periumbilical nodule. EXAM: ULTRASOUND ABDOMEN LIMITED COMPARISON:  CT scan of November 04, 2017. FINDINGS: Limited sonographic evaluation of the periumbilical region demonstrates moderate size hernia. There is solid density within the hernia which may represent bowel loops, although no peristalsis is observed. The possibility of this representing enlarged lymph node or other neoplasm cannot be excluded. IMPRESSION: Moderate size periumbilical hernia is noted. Solid density  is noted within the hernia which may represent bowel loop, but enlarged lymph node or neoplasm cannot be excluded. Clinical correlation is recommended. Electronically Signed   By: Marijo Conception, M.D.   On: 12/19/2017 12:44   Dg Chest Port 1 View  Result Date: 12/20/2017 CLINICAL DATA:  Shortness of breath EXAM: PORTABLE CHEST 1 VIEW COMPARISON:  12/15/2017 FINDINGS: Postoperative changes in the mediastinum. Right PICC line with tip over the low SVC region. No pneumothorax. Cardiac enlargement with pulmonary vascular congestion. Developing interstitial changes in the lungs likely representing edema. No blunting of costophrenic angles. Calcification of the aorta. Degenerative changes in the spine and shoulders. Fracture of the proximal right humerus as seen previously. IMPRESSION: Cardiac enlargement with pulmonary vascular congestion and developing interstitial edema. Electronically Signed   By: Lucienne Capers M.D.   On: 12/20/2017 22:20   Assessment and Plan:  1. Elevated troponin/NSTEMI with hx of CABG 2011: -Pt presented to [facility] on 12/05/17 with AMS and failure to thrive with no clear etiology as to her acute decline. Palliative care medicine consulted for goals of care discussion and son plans to keep her full code and will take her home at discharge. Neurology team has also been consulted given her AMS on presentation. They have reviewed her neuro imaging and feel that this is possibly mixed underlying dementing process consisting of a vascular dementia with Alzheimer's disease, with cognitive worsening precipitated by acute illness and failure to thrive this admission. She has been seen by our service several times in the last year for chest pain evaluation and given her multiple co-morbid conditions, along with her advanced age, medical therapy has continue to be recommended. Early this AM, she had a respiratory distress event and was found to have an elevated troponin at 8.87 which is now up to  12.17. Her BNP was noted to be >4,500 and she was given 60mg  IV lasix with improvement. Given her AMS, I am unable to obtain a clear history as to the events that led her here. She cannot describe if she has had chest pain today. It is quite possible that she has had an MI given this recent episode and elevated troponin however with her acute status, advanced age, renal dysfunction and other co-morbid conditions, it is not likely that she will be a cath candidate for further evaluation. I will discuss the final plane with attending MD to follow.  -EKG, no acute ischemic changes from tracing on presentation -Trop, 8.87>12.17 -BNP, >4,500 with CXR consistent with pulmonary vascular congestion and developing interstitial edema -Continue IV Lasix 40mg  BID and monitor for improvement  -Continue beta blocker, statin -Currently on hep gtt, will likely discontinue if no further invasive workup    2. History of CVA: -Presented with AMS and found to have negative neurological workup for acute changes however neurology team feels that findings in conjunction with her severe cognitive impairment on exam are suggestive of a possible mixed underlying dementing process consisting of a vascular dementia with Alzheimer's disease, with cognitive worsening precipitated by intercurrent illness. -Continue Lipitor  3. HTN: -Stable, continue current regimen   4.CKD stage III: -Creatinine, 1.34 today  -Continue to avoid nephrotoxic mediations    5. Failure to thrive: -s/p PEG tube -Continue tube feeding  6.Enterococcus UTI : -Treated with ampicillin/amoxicillin on initial presentation    For questions or updates, please contact [place] Please consult [email or web address] for contact info under Cardiology/STEMI.   SignedKathyrn Drown NP-C [facility]  Pager: [phone] 12/21/2017 8:26 AM   I have seen and examined the patient along with Kathyrn Drown NP-C.  I have reviewed the chart, notes and new  data.  I agree with PA/NP's note.  Key new complaints: The patient appears to be comfortable, lying fully supine in bed, but does not respond to questions.  She does open her eyes and appears to acknowledge my presence, but will not answer questions regarding chest pain or dyspnea. Key examination changes: She appears chronically ill and frail, not in any acute distress.  Lungs are clear.  Cardiovascular exam is quite unremarkable, without rubs, murmurs or gallops..  There is no evidence of jugular venous distention or leg edema.  Key new findings / data: ECG shows sinus rhythm, nonspecific repolarization changes, no evidence of major ST segment deviation.  Troponin is markedly abnormal and appears to be peaking at around 12 consistent with  a non-ST segment elevation myocardial infarction.  Her most recent echocardiogram from October 10 showed normal left ventricular systolic function and wall motion.  PLAN:  The patient had transient pulmonary edema in the setting of new non-ST segment elevation myocardial infarction, on a background of known coronary artery disease with previous bypass surgery and percutaneous revascularization. It is worth noting that in the last 2 weeks her net fluid balance was supposedly +27 L.  This is unlikely to be accurate, nevertheless her weight has gone up by about 25 pounds since admission.  Even acknowledging that she came in dehydrated, this appears to be excessive. It is really difficult to know what her "dry weight" is anymore.  She has steadily lost substantial weight due to her inability to swallow.  At her last discharge on October 25 she weighed 116 pounds she is now 11 pounds heavier. It is not inconceivable that acute heart failure due to hypervolemia actually precipitated the myocardial infarction, and not vice versa. Mrs. Vavrek has had a drastic worsening of her functional status and overall quality of life over the last year.  She has numerous serious medical  problems, but her neurological status has by far the largest impact on her current functional abilities. She has moderate renal insufficiency, moderate anemia and a relatively recent stroke. I do not think she is a candidate for invasive treatment for her coronary problems.  - Would continue intravenous heparin for no more than 72 hours. - Recheck her echocardiogram.  I do not think the findings on the echo will change her decision has to invasive treatments, but may help guide dosing of heart failure medications.   - Continue statin, beta-blocker.   - Restart clopidogrel.  Far as I can tell the only reason this was stopped was "at family request".. - Continue diuretics, shooting for a "dry weight" of around 116 pounds - I think this is an opportunity to rediscuss goals of care with her family.  Unfortunately, there is no family present at the time of this evaluation.  Prognosis is poor.  Sanda Klein, MD, [place] [phone] 12/21/2017, 11:57 AM

## 2017-12-21 NOTE — Progress Notes (Signed)
Quickly reviewed the just-performed echocardiogram. There is a clear reduction in left ventricular systolic function (EF probably 35-40%), but I do not see any regional pattern consistent with a true acute coronary event.  Diastolic parameters suggest that Mrs. [name] is still hypervolemic. All in all, I think the findings still supports non-STEMI related to hypervolemia/heart failure exacerbation in a patient with likely widespread coronary stenoses. Discussed these considerations with the patient's son, Dr. Lu Duffel who is a Psychiatric nurse.  I think he has a correct and realistic understanding of his mother's condition.  Despite her numerous medical problems and especially her neurological issues, he hopes that part of the difficulties with her swallowing are related to the Botox injection she received for esophageal spasm.  If that is the case, would expect improvement after another 2 or 3 months.  He believes that she is cognitively intact, despite her difficulty with communication.  While possibly overly optimistic, his expectations are rational and he wishes to give his mother time for recovery. Reviewed the reason for use of intravenous heparin and restarting clopidogrel with Dr. Mayer Masker.  Of course he is worried about potential worsening anemia or bleeding problems, but understands the rationale.  Definitely have a low threshold for discontinuation of intravenous heparin if there is any evidence of bleeding.  At this point, some of the patient's anemia might be due to hemodilution. Continue diuresis.  Try to find a way to avoid excessive volume administration (she was receiving both IV fluids and tube feeds). If renal function improves or remains stable with diuresis, consider adding low-dose ARB to assist EF recovery.  Right now creatinine of 1.3 is slightly above her baseline which is probably 1.1-1.2. Monitor potassium, hemoglobin and creatinine closely.  Sanda Klein, MD, [facility]  [facility] [phone] office [phone] pager 12/21/2017 5:18 PM

## 2017-12-21 NOTE — Progress Notes (Signed)
[facility] for Heparin  Indication: ?NSTEMI  Allergies  Allergen Reactions  . Contrast Media [Iodinated Diagnostic Agents]     Sensitivity, kidney issues    Patient Measurements: Height: 5\' 3"  (160 cm) Weight: 127 lb (57.6 kg) IBW/kg (Calculated) : 52.4  Vital Signs: Temp: 99.2 F (37.3 C) (11/21 1121) Temp Source: Axillary (11/21 1121) BP: 135/68 (11/21 1121)  Labs: Recent Labs    12/19/17 1103 12/20/17 0400 12/21/17 0316 12/21/17 0608 12/21/17 1840  HGB 7.6* 7.7*  --  8.0*  --   HCT 24.3* 25.2*  --  26.7*  --   PLT 284 300  --  356  --   HEPARINUNFRC  --   --   --   --  0.39  CREATININE 1.34*  --   --  1.34*  --   TROPONINI  --   --  8.87* 12.17*  --     Estimated Creatinine Clearance: 24 mL/min (A) (by C-G formula based on SCr of 1.34 mg/dL (H)).   Medical History: Past Medical History:  Diagnosis Date  . Acute renal injury ([place]) 07/2016  . Anginal pain ([place]) 2004; 2011  . CAD (coronary artery disease)    status post stenting of the RCA status post CABG. (left internal mammary artery to  LAD, saphenous vein graft to second diagonal, saphenous vein  graft to obtuse marginal 1, saphenous vein graft to posterior  descending).  . Cardiomyopathy     Mildly reduced EF (45%).   . Chronic kidney disease (CKD), stage III (moderate) ([place])   . History of blood transfusion 1960s   "related to anemia"  . HTN (hypertension)   . Pleural effusion 2011   S/P "bypass"  . Shortness of breath   . Stroke ([place]) 09/19/2016   "left sided weakness" (09/20/2016)  . Type II diabetes mellitus (HCC)      Assessment: 84 y/o F with NSTEMI on heparin. Hg has been low but stable. Noted patient not likely for an invasive strategy -heparin level at goal   Goal of Therapy:  Heparin level 0.3-0.7 units/ml Monitor platelets by anticoagulation protocol: Yes   Plan:  -No heparin changes needed -daily CBC and heparin level   Hildred Laser,  PharmD Clinical Pharmacist **Pharmacist phone directory can now be found on [email or web address] ([email or web address]).  Listed under [place].

## 2017-12-21 NOTE — Progress Notes (Signed)
Occupational Therapy Treatment Patient Details Name: [name] MRN: [id number] DOB: [birth year] Today's Date: 12/21/2017    History of present illness 84 yo female with onset of UTI and AMS was admitted with ongoing loss of wgt and dehydration resulting from achalasia with poor result from botox in visit from 11/20/17.  Pt has FTT, has lost 50 pounds in last year.  Intake of mainly milk and is expecting to have g tube per son to restore recent I gait on RW and  I self care.  PMHx:  CAD, AKI, angina, cardiomegaly, CABG, CVA with L hemi, CKD 3, transfusion, pleural effusion, DM, achalasia with botox procedure. G-tube placement 12/10/17.    OT comments  Pt making slow progress towards acute OT goals. Pt sat EOB about 9 minutes with mostly max A (initially min A and with 1 episode of needing only min guard support). Pt was + orthostatics this morning in sitting position. BP assessed as follows: supine at start of session 150/81, sitting EOB after a few minutes 118/79, supine at end of session 135/68. Pt lethargic this session though more alert after bed mobility. D/c plan remains appropriate.    Follow Up Recommendations  SNF    Equipment Recommendations  Wheelchair (measurements OT);Wheelchair cushion (measurements OT);Hospital bed;Other (comment)(air mattress overlay; defer if going to SNF)    Recommendations for Other Services      Precautions / Restrictions Precautions Precautions: Fall Precaution Comments: coccygeal wound, Prevalon boots/ Peg 11/10 with abdominal binder, geo mat Restrictions Weight Bearing Restrictions: No       Mobility Bed Mobility Overal bed mobility: Needs Assistance Bed Mobility: Supine to Sit;Sit to Supine     Supine to sit: Max assist;+2 for physical assistance Sit to supine: Total assist   General bed mobility comments: cognition vs pt's physical ability? decreased command following. +2 physical assist for all aspects of bed mobility. Pt noted to  reach for hand held support during transition to EOB  Transfers                 General transfer comment: Unable to progress to OOB.    Balance Overall balance assessment: Needs assistance Sitting-balance support: Feet supported;No upper extremity supported;Single extremity supported Sitting balance-Leahy Scale: Zero Sitting balance - Comments: posterior lean. sat EOB about 9 minutes. Mostly max A but initially min A and with 1 brief episode of min guard while sitting EOB before quickly fatiguing to max A. Sat mostly with hands in lap occasionally placing one arm in supportive position on bed Postural control: Posterior lean;Right lateral lean                                 ADL either performed or assessed with clinical judgement   ADL Overall ADL's : Needs assistance/impaired                                       General ADL Comments: supine>EOB for 9 minutes then returned to supine position. Fatigues quickly and + orthostatics. For support EOB noted to go from min A to max A with one brief episode  of mustering strength for min guard towards end of sitting EOB.     Vision       Perception     Praxis      Cognition Arousal/Alertness: Lethargic Behavior During Therapy: Flat  affect Overall Cognitive Status: Impaired/Different from baseline Area of Impairment: Attention;Following commands;Safety/judgement;Awareness                   Current Attention Level: Focused   Following Commands: Follows one step commands inconsistently;Follows one step commands with increased time Safety/Judgement: Decreased awareness of safety;Decreased awareness of deficits Awareness: Intellectual Problem Solving: Slow processing;Decreased initiation;Difficulty sequencing;Requires verbal cues;Requires tactile cues General Comments: more alert once sitting EOB and upon return to supine. mostly lethargic during session        Exercises     Shoulder  Instructions       General Comments      Pertinent Vitals/ Pain       Pain Assessment: Faces Faces Pain Scale: Hurts even more Pain Location: General grimacing with movement. Pt did not specify a pain location. Pain Descriptors / Indicators: Grimacing Pain Intervention(s): Limited activity within patient's tolerance;Monitored during session;Repositioned  Home Living                                          Prior Functioning/Environment              Frequency  Min 2X/week        Progress Toward Goals  OT Goals(current goals can now be found in the care plan section)  Progress towards OT goals: Goals drowngraded-see care plan  Acute Rehab OT Goals Patient Stated Goal: None stated this session OT Goal Formulation: Patient unable to participate in goal setting Time For Goal Achievement: 01/03/18 Potential to Achieve Goals: Good ADL Goals Pt Will Perform Grooming: with mod assist;sitting Pt Will Perform Upper Body Dressing: sitting;with mod assist Pt Will Perform Lower Body Dressing: sit to/from stand;with max assist Pt Will Transfer to Toilet: with mod assist;stand pivot transfer;bedside commode;with +2 assist Pt Will Perform Toileting - Clothing Manipulation and hygiene: sitting/lateral leans;with max assist Additional ADL Goal #1: Pt will perform bed mobility with Min A in preparation for ADLs  Plan Discharge plan remains appropriate    Co-evaluation    PT/OT/SLP Co-Evaluation/Treatment: Yes Reason for Co-Treatment: Complexity of the patient's impairments (multi-system involvement);Necessary to address cognition/behavior during functional activity;For patient/therapist safety;To address functional/ADL transfers   OT goals addressed during session: ADL's and self-care;Strengthening/ROM      AM-PAC PT "6 Clicks" Daily Activity     Outcome Measure   Help from another person eating meals?: Total Help from another person taking care of personal  grooming?: A Lot Help from another person toileting, which includes using toliet, bedpan, or urinal?: Total Help from another person bathing (including washing, rinsing, drying)?: A Lot Help from another person to put on and taking off regular upper body clothing?: A Lot Help from another person to put on and taking off regular lower body clothing?: A Lot 6 Click Score: 10    End of Session Equipment Utilized During Treatment: Oxygen;Other (comment)(CAM boots)  OT Visit Diagnosis: Unsteadiness on feet (R26.81);Other abnormalities of gait and mobility (R26.89);Muscle weakness (generalized) (M62.81)   Activity Tolerance Patient limited by fatigue;Other (comment)(+ orthostatic sitting EOB)   Patient Left in bed;with call bell/phone within reach;with restraints reapplied;with SCD's reapplied;with bed alarm set   Nurse Communication Other (comment)(+ orthostatics)        Time: 9381-8299 OT Time Calculation (min): 32 min  Charges: OT General Charges $OT Visit: 1 Visit OT Treatments $Self Care/Home Management : 8-22 mins  Tyrone Schimke, OT Acute Rehabilitation Services Pager: [phone] Office: [phone]   Hortencia Pilar 12/21/2017, 11:54 AM

## 2017-12-21 NOTE — Progress Notes (Signed)
ANTICOAGULATION CONSULT NOTE - Initial Consult  Pharmacy Consult for Heparin  Indication: ?NSTEMI  Allergies  Allergen Reactions  . Contrast Media [Iodinated Diagnostic Agents]     Sensitivity, kidney issues    Patient Measurements: Height: 5\' 3"  (160 cm) Weight: 127 lb (57.6 kg) IBW/kg (Calculated) : 52.4  Vital Signs: Temp: 97.6 F (36.4 C) (11/21 0355) Temp Source: Oral (11/21 0355) BP: 141/94 (11/21 0355) Pulse Rate: 100 (11/21 0355)  Labs: Recent Labs    12/18/17 1344 12/19/17 1103 12/20/17 0400 12/21/17 0316  HGB 7.1* 7.6* 7.7*  --   HCT 23.6* 24.3* 25.2*  --   PLT 255 284 300  --   CREATININE  --  1.34*  --   --   TROPONINI  --   --   --  8.87*    Estimated Creatinine Clearance: 24 mL/min (A) (by C-G formula based on SCr of 1.34 mg/dL (H)).   Medical History: Past Medical History:  Diagnosis Date  . Acute renal injury ([place]) 07/2016  . Anginal pain ([place]) 2004; 2011  . CAD (coronary artery disease)    status post stenting of the RCA status post CABG. (left internal mammary artery to  LAD, saphenous vein graft to second diagonal, saphenous vein  graft to obtuse marginal 1, saphenous vein graft to posterior  descending).  . Cardiomyopathy     Mildly reduced EF (45%).   . Chronic kidney disease (CKD), stage III (moderate) ([place])   . History of blood transfusion 1960s   "related to anemia"  . HTN (hypertension)   . Pleural effusion 2011   S/P "bypass"  . Shortness of breath   . Stroke ([place]) 09/19/2016   "left sided weakness" (09/20/2016)  . Type II diabetes mellitus (HCC)      Assessment: 84 y/o F with some respiratory issues overnight, troponin was obtained and found to be elevated at 8.87. Start heparin. PTA meds reviewed. Hgb is on the low side but stable x 5 days.   Goal of Therapy:  Heparin level 0.3-0.7 units/ml Monitor platelets by anticoagulation protocol: Yes   Plan:  Will defer bolus with advanced age and low Hgb Start heparin drip at  700 units/hr 1500 HL Daily CBC/HL Monitor for bleeding  Narda Bonds 12/21/2017,6:10 AM

## 2017-12-21 NOTE — Progress Notes (Signed)
[facility]                                                                              Patient Demographics  [name], is a 84 y.o. female, DOB - January 26, 1928, WUX:[id number]  Admit date - 12/05/2017   Admitting Physician Darlene Norlander, MD  Outpatient Primary MD for the patient is Darlene Pretty, MD  Outpatient specialists:   LOS - 12  days   Medical records reviewed and are as summarized below:    Chief Complaint  Patient presents with  . Altered Mental Status       Brief summary   [name] is a 84 y.o. femalewith medical history significant forDM2, HTN, HLD, CAD s/p CABG, h/o CVA, CKD3. She presented secondary to dysarthria. MRI significant for no stroke. Also with failure to thrive with PEG tube placed. Working up confusion.    Assessment & Plan    Principal Problem: Acute respiratory failure with hypoxia secondary to pulmonary edema, NSTEMI Overnight events noted, patient was found to have significant shortness of breath with hypoxia, chest x-ray showed interstitial edema with elevated BNP, patient received Lasix 60 mg IV x1 -Feeling somewhat better this morning, placed on Lasix 40 mg IV every 12 hours strict I's and O's and daily weights -Still positive balance of 24 L, patient had been receiving tube feeds, IV fluids, TPN during this hospitalization -Troponins elevated at 8.87, trended up to 12.1, placed on IV heparin drip, cardiology consulted. -Recent 2D echo on 11/09/2017 had shown EF of 35 to 50% with grade 1 diastolic dysfunction, diffuse hypokinesis.  Repeat 2D echo, trend troponins, will await cardiology recommendations.  Active problems Acute metabolic encephalopathy -Patient had presented with dysarthria, confusion.  After discussion with the patient's son (physician), he states that patient does not have dementia -MRI of the brain showed chronic advanced atherosclerosis disease, chronic lacunar infarcts however no new  infarct. -UA was positive 11/5, had shown more than 100,000 colonies of Enterococcus faecalis.  Repeat urine culture on 11/13, showed no growth -LFTs, ammonia level normal.  Neurology evaluation done on 11/20, seen by Dr. Cheral Marker, likely patient has vascular dementia due to chronic infarcts and chronic atherosclerotic disease.   Anemia: Chronic -Baseline appears to be 7.5-8 -Currently 7.7  - Epo level is slightly above normal     Diabetes mellitus (HCC) -Continue Lantus, sliding scale insulin  Failure to thrive, status post PEG tube -Off TPN -Continue tube feeding   Pressure injury Stage II partial-thickness with shallow open ulcer, red-pink wound bed with slough, medial sacrum, present on admission Wound care per nursing  Subcutaneous nodule; non-incarcerated umbilical hernia probably lymph node Seen by surgery, and recommended no surgical treatment at this time  Code Status: Full CODE STATUS DVT Prophylaxis:  SCD's Family Communication: Discussed in detail with the patient, all imaging results, lab results explained to the patient's son   Disposition Plan:  Time Spent in minutes   35 minutes  Procedures:  MRI brain  Consultants:   Palliative medicine Neurology Cardiology  Antimicrobials:      Medications  Scheduled Meds: . sodium chloride   Intravenous Once  .  atorvastatin  40 mg Per Tube q1800  . cholecalciferol  400 Units Per Tube Daily  . feeding supplement (JEVITY 1.5 CAL/FIBER)  237 mL Per Tube QID  . ferrous sulfate  300 mg Per Tube Daily  . free water  250 mL Per Tube Q8H  . furosemide  40 mg Intravenous Q12H  . insulin aspart  0-15 Units Subcutaneous QID  . insulin aspart  3 Units Subcutaneous QID  . insulin glargine  20 Units Subcutaneous QHS  . isosorbide dinitrate  10 mg Per Tube BID  . metoprolol tartrate  75 mg Oral BID  . mirtazapine  7.5 mg Per Tube QHS  . multivitamin  15 mL Per Tube Daily  . pantoprazole sodium  40 mg Per Tube BID  .  sennosides  5 mL Per Tube QHS   Continuous Infusions: . heparin 700 Units/hr (12/21/17 0644)   PRN Meds:.acetaminophen, fentaNYL, hydrALAZINE, loperamide HCl, nitroGLYCERIN, ondansetron (ZOFRAN) IV, sodium chloride flush   Antibiotics   Anti-infectives (From admission, onward)   Start     Dose/Rate Route Frequency Ordered Stop   12/10/17 0919  ceFAZolin (ANCEF) IVPB 1 g/50 mL premix  Status:  Discontinued     over 30 Minutes Intravenous Continuous PRN 12/10/17 0919 12/17/17 1336   12/10/17 0859  ceFAZolin (ANCEF) 2-4 GM/100ML-% IVPB    Note to Pharmacy:  [name]   : cabinet override      12/10/17 0859 12/10/17 2114   12/08/17 2200  ampicillin (OMNIPEN) 1 g in sodium chloride 0.9 % 100 mL IVPB  Status:  Discontinued     1 g 300 mL/hr over 20 Minutes Intravenous Every 8 hours 12/08/17 1535 12/12/17 1837   12/07/17 0600  ceFAZolin (ANCEF) IVPB 2g/100 mL premix     2 g 200 mL/hr over 30 Minutes Intravenous To Short Stay 12/06/17 1219 12/08/17 0600   12/06/17 1200  cefTRIAXone (ROCEPHIN) 1 g in sodium chloride 0.9 % 100 mL IVPB  Status:  Discontinued     1 g 200 mL/hr over 30 Minutes Intravenous Every 24 hours 12/05/17 1947 12/08/17 1535   12/05/17 1445  cefTRIAXone (ROCEPHIN) 1 g in sodium chloride 0.9 % 100 mL IVPB     1 g 200 mL/hr over 30 Minutes Intravenous  Once 12/05/17 1441 12/05/17 1622        Subjective:   [name] was seen and examined today.  Seen earlier this morning, somewhat more alert and awake today but confused, O2 sats in 90s on 4 L..  Difficult to obtain review of system from the patient, denies any pain, no nausea vomiting.  Low-grade temp of 99.4 F  Objective:   Vitals:   12/21/17 0755 12/21/17 1100 12/21/17 1120 12/21/17 1121  BP: (!) 172/84 (!) 150/81 118/79 135/68  Pulse:      Resp: 14   13  Temp: 99.4 F (37.4 C)   99.2 F (37.3 C)  TempSrc: Axillary   Axillary  SpO2:      Weight:      Height:        Intake/Output Summary (Last 24  hours) at 12/21/2017 1212 Last data filed at 12/21/2017 1100 Gross per 24 hour  Intake 654.73 ml  Output 1850 ml  Net -1195.27 ml     Wt Readings from Last 3 Encounters:  12/20/17 57.6 kg  11/24/17 52.9 kg  10/09/17 59.6 kg    Physical Exam  General: Confused but much more alert and awake  Eyes:   HEENT: +  JVD  Cardiovascular: S1 S2 clear, regular rate and rhythm, trace pedal edema b/l  Respiratory: Bibasilar crackles  Gastrointestinal: Soft, nontender, nondistended, + bowel sounds  Ext: trace pedal edema bilaterally  Neuro: no new FND's  Musculoskeletal: No digital cyanosis, clubbing  Skin: stage II sacral ulcer  Psych: confused    Data Reviewed:  I have personally reviewed following labs and imaging studies  Micro Results Recent Results (from the past 240 hour(s))  Culture, Urine     Status: None   Collection Time: 12/13/17  6:19 PM  Result Value Ref Range Status   Specimen Description URINE, CLEAN CATCH  Final   Special Requests NONE  Final   Culture   Final    NO GROWTH Performed at [facility], [address], [place], [place] [address]    Report Status 12/14/2017 FINAL  Final    Radiology Reports Ct Head Wo Contrast  Result Date: 12/05/2017 CLINICAL DATA:  Altered level of consciousness. EXAM: CT HEAD WITHOUT CONTRAST TECHNIQUE: Contiguous axial images were obtained from the base of the skull through the vertex without intravenous contrast. COMPARISON:  CT scan of September 20, 2016. FINDINGS: Brain: Mild chronic ischemic white matter disease is noted. No mass effect or midline shift is noted. Ventricular size is within normal limits. There is no evidence of mass lesion, hemorrhage or acute infarction. Vascular: No hyperdense vessel or unexpected calcification. Skull: Normal. Negative for fracture or focal lesion. Sinuses/Orbits: No acute finding. Other: None. IMPRESSION: Mild chronic ischemic white matter disease. No acute intracranial  abnormality seen. Electronically Signed   By: Marijo Conception, M.D.   On: 12/05/2017 12:03   Mr Brain Wo Contrast (neuro Protocol)  Result Date: 12/05/2017 CLINICAL DATA:  84 year old female with altered mental status. EXAM: MRI HEAD WITHOUT CONTRAST TECHNIQUE: Multiplanar, multiecho pulse sequences of the brain and surrounding structures were obtained without intravenous contrast. COMPARISON:  Head CT without contrast 1140 hours today. Brain MRI 09/20/2016. FINDINGS: Brain: No restricted diffusion to suggest acute infarction. No midline shift, mass effect, evidence of mass lesion, ventriculomegaly, extra-axial collection or acute intracranial hemorrhage. Cervicomedullary junction and pituitary are within normal limits. Chronic lacunar infarcts in the brainstem and bilateral deep gray matter nuclei. Chronic confluent bilateral cerebral white matter T2 and FLAIR hyperintensity. Chronic small area of cortical encephalomalacia at the right motor strip. Chronic microhemorrhage in the lateral right thalamus. No new signal abnormality.  Stable cerebral volume since 2018. Vascular: Major intracranial vascular flow voids are stable since 2018. Skull and upper cervical spine: Negative visible cervical spine. Visualized bone marrow signal is within normal limits. Sinuses/Orbits: Stable and negative. Other: Mastoids remain clear. Visible internal auditory structures appear normal. Scalp and face soft tissues appear negative. IMPRESSION: 1.  No acute intracranial abnormality. 2. Advanced chronic ischemic disease appears stable since 2018. Electronically Signed   By: Genevie Ann M.D.   On: 12/05/2017 17:00   Ir Gastrostomy Tube Mod Sed  Result Date: 12/10/2017 INDICATION: 84 year old female with achalasia and inability to eat. She presents for percutaneous gastrostomy tube placement. EXAM: Fluoroscopically guided placement of percutaneous pull-through gastrostomy tube Interventional Radiologist:  Criselda Peaches, MD  MEDICATIONS: 2 g Ancef; Antibiotics were administered within 1 hour of the procedure. Additionally, 1 mg glucagon was administered prior to the procedure. ANESTHESIA/SEDATION: Versed 1.5 mg IV; Fentanyl 50 mcg IV Moderate Sedation Time:  14 minutes The patient was continuously monitored during the procedure by the interventional radiology nurse under my direct supervision. CONTRAST:  15 mL Isovue 370 injected into the gastric lumen. FLUOROSCOPY TIME:  Fluoroscopy Time: 2 minutes 48 seconds (4 mGy). COMPLICATIONS: None immediate. PROCEDURE: Informed written consent was obtained from the patient after a thorough discussion of the procedural risks, benefits and alternatives. All questions were addressed. Maximal Sterile Barrier Technique was utilized including caps, mask, sterile gowns, sterile gloves, sterile drape, hand hygiene and skin antiseptic. A timeout was performed prior to the initiation of the procedure. Maximal barrier sterile technique utilized including caps, mask, sterile gowns, sterile gloves, large sterile drape, hand hygiene, and chlorhexadine skin prep. An angled catheter was advanced over a wire under fluoroscopic guidance through the nose, down the esophagus and into the body of the stomach. The stomach was then insufflated with several 100 ml of air. Fluoroscopy confirmed location of the gastric bubble, as well as inferior displacement of the barium stained colon. Under direct fluoroscopic guidance, a single T-tack was placed, and the anterior gastric wall drawn up against the anterior abdominal wall. Percutaneous access was then obtained into the mid gastric body with an 18 gauge sheath needle. Aspiration of air, and injection of contrast material under fluoroscopy confirmed needle placement. An Amplatz wire was advanced in the gastric body and the access needle exchanged for a 9-French vascular sheath. A snare device was advanced through the vascular sheath and an Amplatz wire advanced through  the angled catheter. The Amplatz wire was successfully snared and this was pulled up through the esophagus and out the mouth. A 20-French Alinda Dooms MIC-PEG tube was then connected to the snare and pulled through the mouth, down the esophagus, into the stomach and out to the anterior abdominal wall. Hand injection of contrast material confirmed intragastric location. The T-tack retention suture was then cut. The pull through peg tube was then secured with the external bumper and capped. The patient will be observed for several hours with the newly placed tube on low wall suction to evaluate for any post procedure complication. The patient tolerated the procedure well, there is no immediate complication. IMPRESSION: Successful placement of a 20 French pull through gastrostomy tube. Electronically Signed   By: Jacqulynn Cadet M.D.   On: 12/10/2017 09:29   US Abdomen Limited  Result Date: 12/19/2017 CLINICAL DATA:  Periumbilical nodule. EXAM: ULTRASOUND ABDOMEN LIMITED COMPARISON:  CT scan of November 04, 2017. FINDINGS: Limited sonographic evaluation of the periumbilical region demonstrates moderate size hernia. There is solid density within the hernia which may represent bowel loops, although no peristalsis is observed. The possibility of this representing enlarged lymph node or other neoplasm cannot be excluded. IMPRESSION: Moderate size periumbilical hernia is noted. Solid density is noted within the hernia which may represent bowel loop, but enlarged lymph node or neoplasm cannot be excluded. Clinical correlation is recommended. Electronically Signed   By: Marijo Conception, M.D.   On: 12/19/2017 12:44   Dg Chest Port 1 View  Result Date: 12/20/2017 CLINICAL DATA:  Shortness of breath EXAM: PORTABLE CHEST 1 VIEW COMPARISON:  12/15/2017 FINDINGS: Postoperative changes in the mediastinum. Right PICC line with tip over the low SVC region. No pneumothorax. Cardiac enlargement with pulmonary vascular  congestion. Developing interstitial changes in the lungs likely representing edema. No blunting of costophrenic angles. Calcification of the aorta. Degenerative changes in the spine and shoulders. Fracture of the proximal right humerus as seen previously. IMPRESSION: Cardiac enlargement with pulmonary vascular congestion and developing interstitial edema. Electronically Signed   By: Lucienne Capers M.D.   On: 12/20/2017  22:20   Dg Chest Port 1 View  Result Date: 12/15/2017 CLINICAL DATA:  Decreased breath sounds at left lung base,hx htn,dm,cad,cardiomyopathy EXAM: PORTABLE CHEST 1 VIEW COMPARISON:  Chest x-rays dated 12/05/2017, 10/06/2016 and 05/14/2010. FINDINGS: Heart size and mediastinal contours are stable. New RIGHT-sided PICC line in place with tip at the level of the lower SVC/cavoatrial junction. Lungs appear clear. No pleural effusion or pneumothorax seen. Median sternotomy wires appear intact and stable in alignment. No acute or suspicious osseous finding. IMPRESSION: No active disease. No evidence of pneumonia or pulmonary edema. RIGHT-sided PICC line appears well positioned with tip at the level of the lower SVC/cavoatrial junction. Electronically Signed   By: Franki Cabot M.D.   On: 12/15/2017 11:10   Dg Chest Port 1 View  Result Date: 12/05/2017 CLINICAL DATA:  Altered mental status, difficulty with speech EXAM: PORTABLE CHEST 1 VIEW COMPARISON:  Portable chest x-ray of 11/09/2016 FINDINGS: No active infiltrate or effusion is seen. Mediastinal and hilar contours are unremarkable. Mild cardiomegaly is stable as is the ectatic descending thoracic aorta. Posttraumatic degenerative change of the right humeral neck is again noted. IMPRESSION: 1. No active lung disease. 2. Stable mild cardiomegaly. Electronically Signed   By: Ivar Drape M.D.   On: 12/05/2017 10:00   [place] Ekg Site Rite  Result Date: 12/09/2017 If Site Rite image not attached, placement could not be confirmed due to current  cardiac rhythm.   Lab Data:  CBC: Recent Labs  Lab 12/16/17 1419 12/18/17 1344 12/19/17 1103 12/20/17 0400 12/21/17 0608  WBC 8.4 10.8* 10.4 8.8 11.7*  HGB 7.6* 7.1* 7.6* 7.7* 8.0*  HCT 25.7* 23.6* 24.3* 25.2* 26.7*  MCV 92.1 92.2 91.0 91.6 92.1  PLT 269 255 284 300 258   Basic Metabolic Panel: Recent Labs  Lab 12/15/17 0423 12/16/17 1419 12/19/17 1103 12/21/17 0608  NA 136 138 138 140  K 4.1 4.2 4.5 3.7  CL 106 108 108 107  CO2 25 26 24 25   GLUCOSE 266* 225* 172* 58*  BUN 22 22 22  24*  CREATININE 1.32* 1.31* 1.34* 1.34*  CALCIUM 8.4* 8.9 8.7* 9.1  PHOS 1.9* 2.2* 3.2  --    GFR: Estimated Creatinine Clearance: 24 mL/min (A) (by C-G formula based on SCr of 1.34 mg/dL (H)). Liver Function Tests: Recent Labs  Lab 12/15/17 0423 12/16/17 1419 12/19/17 1103 12/19/17 1737 12/20/17 1146  AST  --   --   --   --  27  ALT  --   --   --   --  21  ALKPHOS  --   --   --   --  54  BILITOT  --   --   --  0.5 0.5  PROT  --   --   --   --  5.3*  ALBUMIN 1.5* 1.5* 1.5*  --  1.4*   No results for input(s): LIPASE, AMYLASE in the last 168 hours. Recent Labs  Lab 12/20/17 1146  AMMONIA 11   Coagulation Profile: No results for input(s): INR, PROTIME in the last 168 hours. Cardiac Enzymes: Recent Labs  Lab 12/21/17 0316 12/21/17 0608  TROPONINI 8.87* 12.17*   BNP (last 3 results) No results for input(s): PROBNP in the last 8760 hours. HbA1C: No results for input(s): HGBA1C in the last 72 hours. CBG: Recent Labs  Lab 12/20/17 2020 12/21/17 0015 12/21/17 0348 12/21/17 0749 12/21/17 0851  GLUCAP 192* 176* 71 48* 67*   Lipid Profile: No results for input(s): CHOL,  HDL, LDLCALC, TRIG, CHOLHDL, LDLDIRECT in the last 72 hours. Thyroid Function Tests: No results for input(s): TSH, T4TOTAL, FREET4, T3FREE, THYROIDAB in the last 72 hours. Anemia Panel: No results for input(s): VITAMINB12, FOLATE, FERRITIN, TIBC, IRON, RETICCTPCT in the last 72 hours. Urine  analysis:    Component Value Date/Time   COLORURINE YELLOW 12/13/2017 1642   APPEARANCEUR HAZY (A) 12/13/2017 1642   LABSPEC 1.016 12/13/2017 1642   PHURINE 7.0 12/13/2017 1642   GLUCOSEU >=500 (A) 12/13/2017 1642   HGBUR MODERATE (A) 12/13/2017 1642   BILIRUBINUR NEGATIVE 12/13/2017 1642   KETONESUR NEGATIVE 12/13/2017 1642   PROTEINUR 100 (A) 12/13/2017 1642   UROBILINOGEN 0.2 06/30/2011 1214   NITRITE NEGATIVE 12/13/2017 1642   LEUKOCYTESUR MODERATE (A) 12/13/2017 1642     Borghild Thaker M.D. [facility] 12/21/2017, 12:12 PM  Pager: [phone] Between 7am to 7pm - call Pager - [phone]  After 7pm go to [email or web address] - password [email or web address]  Call night coverage person covering after 7pm

## 2017-12-21 NOTE — Progress Notes (Signed)
Physical Therapy Treatment Patient Details Name: [name] MRN: [id number] DOB: 08-03-1927 Today's Date: 12/21/2017    History of Present Illness 84 yo female with onset of UTI and AMS was admitted with ongoing loss of wgt and dehydration resulting from achalasia with poor result from botox in visit from 11/20/17.  Pt has FTT, has lost 50 pounds in last year.  Intake of mainly milk and is expecting to have g tube per son to restore recent I gait on RW and  I self care.  PMHx:  CAD, AKI, angina, cardiomegaly, CABG, CVA with L hemi, CKD 3, transfusion, pleural effusion, DM, achalasia with botox procedure. G-tube placement 12/10/17.     PT Comments    Patient fast asleep upon entry and lethargic, opening eyes seldomly throughout session despite numerous attempts to stimulate. Total A for bed mobility and sitting EOB, unable to go home at this time. Agree with SNF recs at this time.      Follow Up Recommendations  SNF;Supervision/Assistance - 24 hour     Equipment Recommendations  None recommended by PT    Recommendations for Other Services       Precautions / Restrictions Precautions Precautions: Fall Precaution Comments: coccygeal wound, Prevalon boots/ Peg 11/10 with abdominal binder, geo mat Restrictions Weight Bearing Restrictions: No    Mobility  Bed Mobility Overal bed mobility: Needs Assistance Bed Mobility: Supine to Sit;Sit to Supine Rolling: Max assist Sidelying to sit: Max assist;+2 for physical assistance Supine to sit: Max assist;+2 for physical assistance Sit to supine: Total assist   General bed mobility comments: cognition vs pt's physical ability? decreased command following. +2 physical assist for all aspects of bed mobility. Pt noted to reach for hand held support during transition to EOB  Transfers Overall transfer level: Needs assistance Equipment used: 2 person hand held assist             General transfer comment: Unable to progress to  OOB.  Ambulation/Gait                 Stairs             Wheelchair Mobility    Modified Rankin (Stroke Patients Only)       Balance Overall balance assessment: Needs assistance Sitting-balance support: Feet supported;No upper extremity supported;Single extremity supported Sitting balance-Leahy Scale: Zero Sitting balance - Comments: posterior lean. sat EOB about 9 minutes. Mostly max A but initially min A and with 1 brief episode of min guard while sitting EOB before quickly fatiguing to max A. Sat mostly with hands in lap occasionally placing one arm in supportive position on bed Postural control: Posterior lean;Right lateral lean                                  Cognition Arousal/Alertness: Lethargic Behavior During Therapy: Flat affect Overall Cognitive Status: Impaired/Different from baseline Area of Impairment: Attention;Following commands;Safety/judgement;Awareness                   Current Attention Level: Focused   Following Commands: Follows one step commands inconsistently;Follows one step commands with increased time Safety/Judgement: Decreased awareness of safety;Decreased awareness of deficits Awareness: Intellectual Problem Solving: Slow processing;Decreased initiation;Difficulty sequencing;Requires verbal cues;Requires tactile cues General Comments: more alert once sitting EOB and upon return to supine. mostly lethargic during session      Exercises      General Comments  Pertinent Vitals/Pain Pain Assessment: Faces Faces Pain Scale: Hurts even more Pain Location: General grimacing with movement. Pt did not specify a pain location. Pain Descriptors / Indicators: Grimacing Pain Intervention(s): Monitored during session;Limited activity within patient's tolerance    Home Living                      Prior Function            PT Goals (current goals can now be found in the care plan section) Acute  Rehab PT Goals Patient Stated Goal: None stated this session PT Goal Formulation: With family Time For Goal Achievement: 12/20/17 Potential to Achieve Goals: Fair Progress towards PT goals: Progressing toward goals    Frequency    Min 2X/week      PT Plan Current plan remains appropriate    Co-evaluation PT/OT/SLP Co-Evaluation/Treatment: Yes Reason for Co-Treatment: Complexity of the patient's impairments (multi-system involvement);Necessary to address cognition/behavior during functional activity;For patient/therapist safety PT goals addressed during session: Balance;Mobility/safety with mobility;Proper use of DME OT goals addressed during session: ADL's and self-care;Strengthening/ROM      AM-PAC PT "6 Clicks" Daily Activity  Outcome Measure  Difficulty turning over in bed (including adjusting bedclothes, sheets and blankets)?: Unable Difficulty moving from lying on back to sitting on the side of the bed? : Unable Difficulty sitting down on and standing up from a chair with arms (e.g., wheelchair, bedside commode, etc,.)?: Unable Help needed moving to and from a bed to chair (including a wheelchair)?: Total Help needed walking in hospital room?: Total Help needed climbing 3-5 steps with a railing? : Total 6 Click Score: 6    End of Session Equipment Utilized During Treatment: Gait belt Activity Tolerance: Patient limited by fatigue Patient left: in bed;with call bell/phone within reach;Other (comment) Nurse Communication: Mobility status PT Visit Diagnosis: Muscle weakness (generalized) (M62.81);Other abnormalities of gait and mobility (R26.89);Adult, failure to thrive (R62.7);Hemiplegia and hemiparesis Hemiplegia - Right/Left: Left Hemiplegia - dominant/non-dominant: Non-dominant Hemiplegia - caused by: Unspecified     Time: 2694-8546 PT Time Calculation (min) (ACUTE ONLY): 28 min  Charges:  $Therapeutic Activity: 8-22 mins                    Reinaldo Berber,  PT, DPT Acute Rehabilitation Services Pager: [phone] Office: [phone]     Reinaldo Berber 12/21/2017, 1:21 PM

## 2017-12-21 NOTE — Progress Notes (Signed)
[name], Dr. Romilda Joy, was called and made aware of mother's status and transfer to stepdown.  All questions answered.  Earleen Reaper RN

## 2017-12-22 DIAGNOSIS — I5043 Acute on chronic combined systolic (congestive) and diastolic (congestive) heart failure: Secondary | ICD-10-CM

## 2017-12-22 DIAGNOSIS — N289 Disorder of kidney and ureter, unspecified: Secondary | ICD-10-CM

## 2017-12-22 DIAGNOSIS — E785 Hyperlipidemia, unspecified: Secondary | ICD-10-CM

## 2017-12-22 DIAGNOSIS — I25118 Atherosclerotic heart disease of native coronary artery with other forms of angina pectoris: Secondary | ICD-10-CM

## 2017-12-22 DIAGNOSIS — R627 Adult failure to thrive: Secondary | ICD-10-CM

## 2017-12-22 DIAGNOSIS — N189 Chronic kidney disease, unspecified: Secondary | ICD-10-CM

## 2017-12-22 LAB — CBC
HCT: 25.9 % — ABNORMAL LOW (ref 36.0–46.0)
Hemoglobin: 7.9 g/dL — ABNORMAL LOW (ref 12.0–15.0)
MCH: 27.7 pg (ref 26.0–34.0)
MCHC: 30.5 g/dL (ref 30.0–36.0)
MCV: 90.9 fL (ref 80.0–100.0)
Platelets: 337 10*3/uL (ref 150–400)
RBC: 2.85 MIL/uL — ABNORMAL LOW (ref 3.87–5.11)
RDW: 18.4 % — ABNORMAL HIGH (ref 11.5–15.5)
WBC: 15.1 10*3/uL — ABNORMAL HIGH (ref 4.0–10.5)
nRBC: 0 % (ref 0.0–0.2)

## 2017-12-22 LAB — BASIC METABOLIC PANEL
Anion gap: 8 (ref 5–15)
BUN: 30 mg/dL — ABNORMAL HIGH (ref 8–23)
CHLORIDE: 101 mmol/L (ref 98–111)
CO2: 26 mmol/L (ref 22–32)
CREATININE: 1.52 mg/dL — AB (ref 0.44–1.00)
Calcium: 8.8 mg/dL — ABNORMAL LOW (ref 8.9–10.3)
GFR, EST AFRICAN AMERICAN: 34 mL/min — AB (ref >60)
GFR, EST NON AFRICAN AMERICAN: 29 mL/min — AB (ref >60)
Glucose, Bld: 187 mg/dL — ABNORMAL HIGH (ref 70–99)
POTASSIUM: 4.3 mmol/L (ref 3.5–5.1)
SODIUM: 135 mmol/L (ref 135–145)

## 2017-12-22 LAB — HEPARIN LEVEL (UNFRACTIONATED)
HEPARIN UNFRACTIONATED: 0.16 [IU]/mL — AB (ref 0.30–0.70)
Heparin Unfractionated: <0.1 IU/mL — ABNORMAL LOW (ref 0.30–0.70)

## 2017-12-22 LAB — GLUCOSE, CAPILLARY
Glucose-Capillary: 215 mg/dL — ABNORMAL HIGH (ref 70–99)
Glucose-Capillary: 153 mg/dL — ABNORMAL HIGH (ref 70–99)
Glucose-Capillary: 167 mg/dL — ABNORMAL HIGH (ref 70–99)
Glucose-Capillary: 252 mg/dL — ABNORMAL HIGH (ref 70–99)
Glucose-Capillary: 226 mg/dL — ABNORMAL HIGH (ref 70–99)

## 2017-12-22 LAB — TROPONIN I: TROPONIN I: 5.12 ng/mL — AB (ref <0.03)

## 2017-12-22 MED ORDER — FUROSEMIDE 10 MG/ML IJ SOLN
INTRAMUSCULAR | Status: AC
  Filled 2017-12-22: qty 4

## 2017-12-22 MED ORDER — FUROSEMIDE 10 MG/ML IJ SOLN
40.0000 mg | Freq: Once | INTRAMUSCULAR | Status: AC
  Administered 2017-12-22: 40 mg via INTRAVENOUS

## 2017-12-22 MED ORDER — IPRATROPIUM-ALBUTEROL 0.5-2.5 (3) MG/3ML IN SOLN: 3.0000 mL | RESPIRATORY_TRACT | Status: DC | PRN

## 2017-12-22 MED ORDER — FUROSEMIDE 40 MG PO TABS: 40.0000 mg | ORAL_TABLET | Freq: Every day | ORAL | Status: DC

## 2017-12-22 NOTE — Progress Notes (Signed)
[place] for Heparin  Indication: NSTEMI  Allergies  Allergen Reactions  . Contrast Media [Iodinated Diagnostic Agents]     Sensitivity, kidney issues    Patient Measurements: Height: 5\' 3"  (160 cm) Weight: 127 lb (57.6 kg) IBW/kg (Calculated) : 52.4  Vital Signs: Temp: 100.2 F (37.9 C) (11/22 0742) Temp Source: Axillary (11/22 0742) BP: 142/78 (11/22 0742) Pulse Rate: 114 (11/22 0742)  Labs: Recent Labs    12/19/17 1103 12/20/17 0400  12/21/17 0608 12/21/17 1840 12/22/17 0524 12/22/17 0718  HGB 7.6* 7.7*  --  8.0*  --   --   --   HCT 24.3* 25.2*  --  26.7*  --   --   --   PLT 284 300  --  356  --   --   --   HEPARINUNFRC  --   --   --   --  0.39 <0.10* <0.10*  CREATININE 1.34*  --   --  1.34*  --  1.52*  --   TROPONINI  --   --    < > 12.17* 10.94* 5.12*  --    < > = values in this interval not displayed.    Estimated Creatinine Clearance: 21.2 mL/min (A) (by C-G formula based on SCr of 1.52 mg/dL (H)).   Medical History: Past Medical History:  Diagnosis Date  . Acute renal injury ([place]) 07/2016  . Anginal pain ([place]) 2004; 2011  . CAD (coronary artery disease)    status post stenting of the RCA status post CABG. (left internal mammary artery to  LAD, saphenous vein graft to second diagonal, saphenous vein  graft to obtuse marginal 1, saphenous vein graft to posterior  descending).  . Cardiomyopathy     Mildly reduced EF (45%).   . Chronic kidney disease (CKD), stage III (moderate) ([place])   . History of blood transfusion 1960s   "related to anemia"  . HTN (hypertension)   . Pleural effusion 2011   S/P "bypass"  . Shortness of breath   . Stroke ([place]) 09/19/2016   "left sided weakness" (09/20/2016)  . Type II diabetes mellitus (HCC)      Assessment: 84 y/o F with NSTEMI on heparin. Pt is to be managed with medication management only at this time - heparin planned for 48-72 hours. Initial heparin level therapeutic,  now undetectable and confirmed with redraw. No issues with infusion per RN and it has not been stopped overnight.  Goal of Therapy:  Heparin level 0.3-0.7 units/ml Monitor platelets by anticoagulation protocol: Yes   Plan:  -Increase heparin to 900 units/hr -Recheck heparin level in 8 hours  Arrie Senate, PharmD, BCPS Clinical Pharmacist [phone] Please check AMION for all [place] numbers 12/22/2017

## 2017-12-22 NOTE — Progress Notes (Signed)
[place] for Heparin  Indication: NSTEMI  Allergies  Allergen Reactions  . Contrast Media [Iodinated Diagnostic Agents]     Sensitivity, kidney issues    Patient Measurements: Height: 5\' 3"  (160 cm) Weight: 127 lb (57.6 kg) IBW/kg (Calculated) : 52.4  Vital Signs: Temp: 98.1 F (36.7 C) (11/22 1137) Temp Source: Oral (11/22 1137) BP: 112/52 (11/22 1137) Pulse Rate: 91 (11/22 1137)  Labs: Recent Labs    12/20/17 0400  12/21/17 0608  12/21/17 1840 12/22/17 0524 12/22/17 0718 12/22/17 1219 12/22/17 1630  HGB 7.7*  --  8.0*  --   --   --   --  7.9*  --   HCT 25.2*  --  26.7*  --   --   --   --  25.9*  --   PLT 300  --  356  --   --   --   --  337  --   HEPARINUNFRC  --   --   --    < > 0.39 <0.10* <0.10*  --  0.16*  CREATININE  --   --  1.34*  --   --  1.52*  --   --   --   TROPONINI  --    < > 12.17*  --  10.94* 5.12*  --   --   --    < > = values in this interval not displayed.    Estimated Creatinine Clearance: 21.2 mL/min (A) (by C-G formula based on SCr of 1.52 mg/dL (H)).   Medical History: Past Medical History:  Diagnosis Date  . Acute renal injury ([place]) 07/2016  . Anginal pain ([place]) 2004; 2011  . CAD (coronary artery disease)    status post stenting of the RCA status post CABG. (left internal mammary artery to  LAD, saphenous vein graft to second diagonal, saphenous vein  graft to obtuse marginal 1, saphenous vein graft to posterior  descending).  . Cardiomyopathy     Mildly reduced EF (45%).   . Chronic kidney disease (CKD), stage III (moderate) ([place])   . History of blood transfusion 1960s   "related to anemia"  . HTN (hypertension)   . Pleural effusion 2011   S/P "bypass"  . Shortness of breath   . Stroke ([place]) 09/19/2016   "left sided weakness" (09/20/2016)  . Type II diabetes mellitus (HCC)      Assessment: 84 y/o F with NSTEMI on heparin. Pt is to be managed with medication management only at this time -  heparin planned for 48-72 hours.  Heparin level subtherapeutic at 0.16.   Goal of Therapy:  Heparin level 0.3-0.7 units/ml Monitor platelets by anticoagulation protocol: Yes   Plan:  -Increase heparin to 1050 units/hr -Recheck heparin level in 8 hours   Alanda Slim, PharmD, [facility] Clinical Pharmacist Please see AMION for all Pharmacists' Contact Phone Numbers 12/22/2017, 5:45 PM

## 2017-12-22 NOTE — Progress Notes (Signed)
Pt HR 136 and sustaining; audible wheezes noted. On call provider notified, awaiting call back.

## 2017-12-22 NOTE — Progress Notes (Addendum)
[facility]                                                                              Patient Demographics  [name], is a 84 y.o. female, DOB - 10/05/1927, EZM:[id number]  Admit date - 12/05/2017   Admitting Physician Janora Norlander, MD  Outpatient Primary MD for the patient is Deland Pretty, MD  Outpatient specialists:   LOS - 13  days   Medical records reviewed and are as summarized below:    Chief Complaint  Patient presents with  . Altered Mental Status       Brief summary   [name] is a 84 y.o. femalewith medical history significant forDM2, HTN, HLD, CAD s/p CABG, h/o CVA, CKD3. She presented secondary to dysarthria. MRI significant for no stroke. Also with failure to thrive with PEG tube placed. Working up confusion.    Assessment & Plan    Principal Problem: Acute respiratory failure with hypoxia secondary to pulmonary edema, NSTEMI Overnight events noted, patient was found to have significant shortness of breath with hypoxia, chest x-ray showed interstitial edema with elevated BNP, patient received Lasix 60 mg IV x1 -Troponins elevated at 8.87, trended up to 12.1, placed on IV heparin drip, cardiology consulted.  Troponins now trending down, O2 sats 100% on room air -Recent 2D echo on 11/09/2017 had shown EF of 35 to 50% with grade 1 diastolic dysfunction.  Repeat echo showed EF of 30% with grade 2 diastolic dysfunction, diffuse hypokinesis -Cardiology consulted, continue IV heparin for 72 hours or less, monitor for any bleeding.  H&H stable -Still positive balance of 22.4 L, follow daily weights  Active problems Acute metabolic encephalopathy -Patient had presented with dysarthria, confusion.  After discussion with the patient's son (physician), he states that patient does not have dementia -MRI of the brain showed chronic advanced atherosclerosis disease, chronic lacunar infarcts however no new infarct. -UA was positive 11/5, had  shown more than 100,000 colonies of Enterococcus faecalis.  Repeat urine culture on 11/13, showed no growth -LFTs, ammonia level normal.  Neurology evaluation done on 11/20, seen by Dr. Cheral Marker, likely patient has vascular dementia due to chronic infarcts and chronic atherosclerotic disease.  Fevers -Spiking low-grade fevers overnight, recent chest x-ray on 11/20 showed no pneumonia, unclear if patient has silent aspiration, obtain SLP evaluation, UA/culture, blood cultures x2 -Obtain CBC, if leukocytosis, will empirically placed on IV Zosyn   Anemia: Chronic -Baseline appears to be 7.5-8 - Epo level is slightly above normal -Hemoglobin 8.0 on 11/21, keep hemoglobin above 8     Diabetes mellitus (HCC) type II, uncontrolled -CBGs 153 today, continue Lantus, sliding scale insulin  Failure to thrive, status post PEG tube -Off TPN -Continue tube feeding   Pressure injury Stage II partial-thickness with shallow open ulcer, red-pink wound bed with slough, medial sacrum, present on admission Wound care per nursing  Subcutaneous nodule; non-incarcerated umbilical hernia probably lymph node Seen by surgery, and recommended no surgical treatment at this time  Code Status: Full CODE STATUS DVT Prophylaxis:  SCD's Family Communication: Discussed in detail with the patient, all imaging results, lab results explained to  the patient's son yesterday   Disposition Plan:  Time Spent in minutes 25 minutes  Procedures:  MRI brain 2D echo  Consultants:   Palliative medicine Neurology Cardiology  Antimicrobials:      Medications  Scheduled Meds: . sodium chloride   Intravenous Once  . atorvastatin  40 mg Per Tube q1800  . cholecalciferol  400 Units Per Tube Daily  . clopidogrel  75 mg Oral Daily  . feeding supplement (JEVITY 1.5 CAL/FIBER)  237 mL Per Tube QID  . ferrous sulfate  300 mg Per Tube Daily  . free water  250 mL Per Tube Q8H  . furosemide  40 mg Intravenous Q12H  .  [START ON 12/23/2017] furosemide  40 mg Oral Daily  . insulin aspart  0-15 Units Subcutaneous QID  . insulin aspart  3 Units Subcutaneous QID  . insulin glargine  20 Units Subcutaneous QHS  . isosorbide dinitrate  10 mg Per Tube BID  . metoprolol tartrate  75 mg Oral BID  . mirtazapine  7.5 mg Per Tube QHS  . multivitamin  15 mL Per Tube Daily  . pantoprazole sodium  40 mg Per Tube BID  . potassium chloride  20 mEq Per Tube BID  . sennosides  5 mL Per Tube QHS   Continuous Infusions: . heparin 900 Units/hr (12/22/17 0854)   PRN Meds:.acetaminophen, fentaNYL, hydrALAZINE, loperamide HCl, nitroGLYCERIN, ondansetron (ZOFRAN) IV, sodium chloride flush   Antibiotics   Anti-infectives (From admission, onward)   Start     Dose/Rate Route Frequency Ordered Stop   12/10/17 0919  ceFAZolin (ANCEF) IVPB 1 g/50 mL premix  Status:  Discontinued     over 30 Minutes Intravenous Continuous PRN 12/10/17 0919 12/17/17 1336   12/10/17 0859  ceFAZolin (ANCEF) 2-4 GM/100ML-% IVPB    Note to Pharmacy:  Margaretmary Dys   : cabinet override      12/10/17 0859 12/10/17 2114   12/08/17 2200  ampicillin (OMNIPEN) 1 g in sodium chloride 0.9 % 100 mL IVPB  Status:  Discontinued     1 g 300 mL/hr over 20 Minutes Intravenous Every 8 hours 12/08/17 1535 12/12/17 1837   12/07/17 0600  ceFAZolin (ANCEF) IVPB 2g/100 mL premix     2 g 200 mL/hr over 30 Minutes Intravenous To Short Stay 12/06/17 1219 12/08/17 0600   12/06/17 1200  cefTRIAXone (ROCEPHIN) 1 g in sodium chloride 0.9 % 100 mL IVPB  Status:  Discontinued     1 g 200 mL/hr over 30 Minutes Intravenous Every 24 hours 12/05/17 1947 12/08/17 1535   12/05/17 1445  cefTRIAXone (ROCEPHIN) 1 g in sodium chloride 0.9 % 100 mL IVPB     1 g 200 mL/hr over 30 Minutes Intravenous  Once 12/05/17 1441 12/05/17 1622        Subjective:   [name] was seen and examined today.  Alert and awake, unable to obtain review of system from the patient due to her mental  status.  Breathing seems to be improving, spiking low-grade fevers overnight, 100.8 F.    Objective:   Vitals:   12/21/17 2350 12/22/17 0322 12/22/17 0742 12/22/17 1137  BP: 130/71 133/77 (!) 142/78 (!) 112/52  Pulse:  94 (!) 114 91  Resp: 16 18 20  (!) 22  Temp: 100.1 F (37.8 C) (!) 100.8 F (38.2 C) 100.2 F (37.9 C) 98.1 F (36.7 C)  TempSrc: Axillary Axillary Axillary Oral  SpO2: 100% 94% 96% 100%  Weight:      Height:  Intake/Output Summary (Last 24 hours) at 12/22/2017 1154 Last data filed at 12/22/2017 1140 Gross per 24 hour  Intake 461.86 ml  Output 1800 ml  Net -1338.14 ml     Wt Readings from Last 3 Encounters:  12/20/17 57.6 kg  11/24/17 52.9 kg  10/09/17 59.6 kg    Physical Exam  General: Alert and awake, confused  Eyes: PERRLA, EOMI, Anicteric Sclera,  HEENT: JVP +  Cardiovascular: S1 S2 auscultated, Regular rate and rhythm. No pedal edema b/l  Respiratory: Fairly CTA B  Gastrointestinal: Soft, nontender, nondistended, + bowel sounds  Ext: lower extremity in bilateral heel protectors  Neuro: does not follow commands  Musculoskeletal: No digital cyanosis, clubbing  Skin: Stage II sacral ulcer  Psych: unable to assess     Data Reviewed:  I have personally reviewed following labs and imaging studies  Micro Results Recent Results (from the past 240 hour(s))  Culture, Urine     Status: None   Collection Time: 12/13/17  6:19 PM  Result Value Ref Range Status   Specimen Description URINE, CLEAN CATCH  Final   Special Requests NONE  Final   Culture   Final    NO GROWTH Performed at [facility], [address] [place], [place] [address]    Report Status 12/14/2017 FINAL  Final    Radiology Reports Ct Head Wo Contrast  Result Date: 12/05/2017 CLINICAL DATA:  Altered level of consciousness. EXAM: CT HEAD WITHOUT CONTRAST TECHNIQUE: Contiguous axial images were obtained from the base of the skull through the vertex  without intravenous contrast. COMPARISON:  CT scan of September 20, 2016. FINDINGS: Brain: Mild chronic ischemic white matter disease is noted. No mass effect or midline shift is noted. Ventricular size is within normal limits. There is no evidence of mass lesion, hemorrhage or acute infarction. Vascular: No hyperdense vessel or unexpected calcification. Skull: Normal. Negative for fracture or focal lesion. Sinuses/Orbits: No acute finding. Other: None. IMPRESSION: Mild chronic ischemic white matter disease. No acute intracranial abnormality seen. Electronically Signed   By: Marijo Conception, M.D.   On: 12/05/2017 12:03   Mr Brain Wo Contrast (neuro Protocol)  Result Date: 12/05/2017 CLINICAL DATA:  84 year old female with altered mental status. EXAM: MRI HEAD WITHOUT CONTRAST TECHNIQUE: Multiplanar, multiecho pulse sequences of the brain and surrounding structures were obtained without intravenous contrast. COMPARISON:  Head CT without contrast 1140 hours today. Brain MRI 09/20/2016. FINDINGS: Brain: No restricted diffusion to suggest acute infarction. No midline shift, mass effect, evidence of mass lesion, ventriculomegaly, extra-axial collection or acute intracranial hemorrhage. Cervicomedullary junction and pituitary are within normal limits. Chronic lacunar infarcts in the brainstem and bilateral deep gray matter nuclei. Chronic confluent bilateral cerebral white matter T2 and FLAIR hyperintensity. Chronic small area of cortical encephalomalacia at the right motor strip. Chronic microhemorrhage in the lateral right thalamus. No new signal abnormality.  Stable cerebral volume since 2018. Vascular: Major intracranial vascular flow voids are stable since 2018. Skull and upper cervical spine: Negative visible cervical spine. Visualized bone marrow signal is within normal limits. Sinuses/Orbits: Stable and negative. Other: Mastoids remain clear. Visible internal auditory structures appear normal. Scalp and face soft  tissues appear negative. IMPRESSION: 1.  No acute intracranial abnormality. 2. Advanced chronic ischemic disease appears stable since 2018. Electronically Signed   By: Genevie Ann M.D.   On: 12/05/2017 17:00   Ir Gastrostomy Tube Mod Sed  Result Date: 12/10/2017 INDICATION: 84 year old female with achalasia and inability to eat. She  presents for percutaneous gastrostomy tube placement. EXAM: Fluoroscopically guided placement of percutaneous pull-through gastrostomy tube Interventional Radiologist:  Criselda Peaches, MD MEDICATIONS: 2 g Ancef; Antibiotics were administered within 1 hour of the procedure. Additionally, 1 mg glucagon was administered prior to the procedure. ANESTHESIA/SEDATION: Versed 1.5 mg IV; Fentanyl 50 mcg IV Moderate Sedation Time:  14 minutes The patient was continuously monitored during the procedure by the interventional radiology nurse under my direct supervision. CONTRAST:  15 mL Isovue 370 injected into the gastric lumen. FLUOROSCOPY TIME:  Fluoroscopy Time: 2 minutes 48 seconds (4 mGy). COMPLICATIONS: None immediate. PROCEDURE: Informed written consent was obtained from the patient after a thorough discussion of the procedural risks, benefits and alternatives. All questions were addressed. Maximal Sterile Barrier Technique was utilized including caps, mask, sterile gowns, sterile gloves, sterile drape, hand hygiene and skin antiseptic. A timeout was performed prior to the initiation of the procedure. Maximal barrier sterile technique utilized including caps, mask, sterile gowns, sterile gloves, large sterile drape, hand hygiene, and chlorhexadine skin prep. An angled catheter was advanced over a wire under fluoroscopic guidance through the nose, down the esophagus and into the body of the stomach. The stomach was then insufflated with several 100 ml of air. Fluoroscopy confirmed location of the gastric bubble, as well as inferior displacement of the barium stained colon. Under direct  fluoroscopic guidance, a single T-tack was placed, and the anterior gastric wall drawn up against the anterior abdominal wall. Percutaneous access was then obtained into the mid gastric body with an 18 gauge sheath needle. Aspiration of air, and injection of contrast material under fluoroscopy confirmed needle placement. An Amplatz wire was advanced in the gastric body and the access needle exchanged for a 9-French vascular sheath. A snare device was advanced through the vascular sheath and an Amplatz wire advanced through the angled catheter. The Amplatz wire was successfully snared and this was pulled up through the esophagus and out the mouth. A 20-French Alinda Dooms MIC-PEG tube was then connected to the snare and pulled through the mouth, down the esophagus, into the stomach and out to the anterior abdominal wall. Hand injection of contrast material confirmed intragastric location. The T-tack retention suture was then cut. The pull through peg tube was then secured with the external bumper and capped. The patient will be observed for several hours with the newly placed tube on low wall suction to evaluate for any post procedure complication. The patient tolerated the procedure well, there is no immediate complication. IMPRESSION: Successful placement of a 20 French pull through gastrostomy tube. Electronically Signed   By: Jacqulynn Cadet M.D.   On: 12/10/2017 09:29   US Abdomen Limited  Result Date: 12/19/2017 CLINICAL DATA:  Periumbilical nodule. EXAM: ULTRASOUND ABDOMEN LIMITED COMPARISON:  CT scan of November 04, 2017. FINDINGS: Limited sonographic evaluation of the periumbilical region demonstrates moderate size hernia. There is solid density within the hernia which may represent bowel loops, although no peristalsis is observed. The possibility of this representing enlarged lymph node or other neoplasm cannot be excluded. IMPRESSION: Moderate size periumbilical hernia is noted. Solid density is  noted within the hernia which may represent bowel loop, but enlarged lymph node or neoplasm cannot be excluded. Clinical correlation is recommended. Electronically Signed   By: Marijo Conception, M.D.   On: 12/19/2017 12:44   Dg Chest Port 1 View  Result Date: 12/20/2017 CLINICAL DATA:  Shortness of breath EXAM: PORTABLE CHEST 1 VIEW COMPARISON:  12/15/2017 FINDINGS: Postoperative changes  in the mediastinum. Right PICC line with tip over the low SVC region. No pneumothorax. Cardiac enlargement with pulmonary vascular congestion. Developing interstitial changes in the lungs likely representing edema. No blunting of costophrenic angles. Calcification of the aorta. Degenerative changes in the spine and shoulders. Fracture of the proximal right humerus as seen previously. IMPRESSION: Cardiac enlargement with pulmonary vascular congestion and developing interstitial edema. Electronically Signed   By: Lucienne Capers M.D.   On: 12/20/2017 22:20   Dg Chest Port 1 View  Result Date: 12/15/2017 CLINICAL DATA:  Decreased breath sounds at left lung base,hx htn,dm,cad,cardiomyopathy EXAM: PORTABLE CHEST 1 VIEW COMPARISON:  Chest x-rays dated 12/05/2017, 10/06/2016 and 05/14/2010. FINDINGS: Heart size and mediastinal contours are stable. New RIGHT-sided PICC line in place with tip at the level of the lower SVC/cavoatrial junction. Lungs appear clear. No pleural effusion or pneumothorax seen. Median sternotomy wires appear intact and stable in alignment. No acute or suspicious osseous finding. IMPRESSION: No active disease. No evidence of pneumonia or pulmonary edema. RIGHT-sided PICC line appears well positioned with tip at the level of the lower SVC/cavoatrial junction. Electronically Signed   By: Franki Cabot M.D.   On: 12/15/2017 11:10   Dg Chest Port 1 View  Result Date: 12/05/2017 CLINICAL DATA:  Altered mental status, difficulty with speech EXAM: PORTABLE CHEST 1 VIEW COMPARISON:  Portable chest x-ray of  11/09/2016 FINDINGS: No active infiltrate or effusion is seen. Mediastinal and hilar contours are unremarkable. Mild cardiomegaly is stable as is the ectatic descending thoracic aorta. Posttraumatic degenerative change of the right humeral neck is again noted. IMPRESSION: 1. No active lung disease. 2. Stable mild cardiomegaly. Electronically Signed   By: Ivar Drape M.D.   On: 12/05/2017 10:00   [place] Ekg Site Rite  Result Date: 12/09/2017 If Site Rite image not attached, placement could not be confirmed due to current cardiac rhythm.   Lab Data:  CBC: Recent Labs  Lab 12/16/17 1419 12/18/17 1344 12/19/17 1103 12/20/17 0400 12/21/17 0608  WBC 8.4 10.8* 10.4 8.8 11.7*  HGB 7.6* 7.1* 7.6* 7.7* 8.0*  HCT 25.7* 23.6* 24.3* 25.2* 26.7*  MCV 92.1 92.2 91.0 91.6 92.1  PLT 269 255 284 300 027   Basic Metabolic Panel: Recent Labs  Lab 12/16/17 1419 12/19/17 1103 12/21/17 0608 12/22/17 0524  NA 138 138 140 135  K 4.2 4.5 3.7 4.3  CL 108 108 107 101  CO2 26 24 25 26   GLUCOSE 225* 172* 58* 187*  BUN 22 22 24* 30*  CREATININE 1.31* 1.34* 1.34* 1.52*  CALCIUM 8.9 8.7* 9.1 8.8*  PHOS 2.2* 3.2  --   --    GFR: Estimated Creatinine Clearance: 21.2 mL/min (A) (by C-G formula based on SCr of 1.52 mg/dL (H)). Liver Function Tests: Recent Labs  Lab 12/16/17 1419 12/19/17 1103 12/19/17 1737 12/20/17 1146  AST  --   --   --  27  ALT  --   --   --  21  ALKPHOS  --   --   --  54  BILITOT  --   --  0.5 0.5  PROT  --   --   --  5.3*  ALBUMIN 1.5* 1.5*  --  1.4*   No results for input(s): LIPASE, AMYLASE in the last 168 hours. Recent Labs  Lab 12/20/17 1146  AMMONIA 11   Coagulation Profile: No results for input(s): INR, PROTIME in the last 168 hours. Cardiac Enzymes: Recent Labs  Lab 12/21/17 0316 12/21/17  8250 12/21/17 1840 12/22/17 0524  TROPONINI 8.87* 12.17* 10.94* 5.12*   BNP (last 3 results) No results for input(s): PROBNP in the last 8760 hours. HbA1C: No results  for input(s): HGBA1C in the last 72 hours. CBG: Recent Labs  Lab 12/21/17 1448 12/21/17 1823 12/21/17 2132 12/22/17 0319 12/22/17 0830  GLUCAP 173* 240* 259* 215* 153*   Lipid Profile: No results for input(s): CHOL, HDL, LDLCALC, TRIG, CHOLHDL, LDLDIRECT in the last 72 hours. Thyroid Function Tests: No results for input(s): TSH, T4TOTAL, FREET4, T3FREE, THYROIDAB in the last 72 hours. Anemia Panel: No results for input(s): VITAMINB12, FOLATE, FERRITIN, TIBC, IRON, RETICCTPCT in the last 72 hours. Urine analysis:    Component Value Date/Time   COLORURINE YELLOW 12/13/2017 1642   APPEARANCEUR HAZY (A) 12/13/2017 1642   LABSPEC 1.016 12/13/2017 1642   PHURINE 7.0 12/13/2017 1642   GLUCOSEU >=500 (A) 12/13/2017 1642   HGBUR MODERATE (A) 12/13/2017 1642   BILIRUBINUR NEGATIVE 12/13/2017 1642   KETONESUR NEGATIVE 12/13/2017 1642   PROTEINUR 100 (A) 12/13/2017 1642   UROBILINOGEN 0.2 06/30/2011 1214   NITRITE NEGATIVE 12/13/2017 1642   LEUKOCYTESUR MODERATE (A) 12/13/2017 1642     Jailynne Opperman M.D. [facility] 12/22/2017, 11:54 AM  Pager: [phone] Between 7am to 7pm - call Pager - [phone]  After 7pm go to [email or web address] - password [email or web address]  Call night coverage person covering after 7pm

## 2017-12-22 NOTE — Progress Notes (Signed)
Progress Note  Patient Name: [name] Date of Encounter: 12/22/2017  Primary Cardiologist: Minus Breeding, MD   Subjective   Slightly more alert, but will not answer my questions. Appears calm and comfortable.  Inpatient Medications    Scheduled Meds: . sodium chloride   Intravenous Once  . atorvastatin  40 mg Per Tube q1800  . cholecalciferol  400 Units Per Tube Daily  . clopidogrel  75 mg Oral Daily  . feeding supplement (JEVITY 1.5 CAL/FIBER)  237 mL Per Tube QID  . ferrous sulfate  300 mg Per Tube Daily  . free water  250 mL Per Tube Q8H  . furosemide  40 mg Intravenous Q12H  . [START ON 12/23/2017] furosemide  40 mg Oral Daily  . insulin aspart  0-15 Units Subcutaneous QID  . insulin aspart  3 Units Subcutaneous QID  . insulin glargine  20 Units Subcutaneous QHS  . isosorbide dinitrate  10 mg Per Tube BID  . metoprolol tartrate  75 mg Oral BID  . mirtazapine  7.5 mg Per Tube QHS  . multivitamin  15 mL Per Tube Daily  . pantoprazole sodium  40 mg Per Tube BID  . potassium chloride  20 mEq Per Tube BID  . sennosides  5 mL Per Tube QHS   Continuous Infusions: . heparin 900 Units/hr (12/22/17 0854)   PRN Meds: acetaminophen, fentaNYL, hydrALAZINE, loperamide HCl, nitroGLYCERIN, ondansetron (ZOFRAN) IV, sodium chloride flush   Vital Signs    Vitals:   12/21/17 2028 12/21/17 2350 12/22/17 0322 12/22/17 0742  BP: 127/65 130/71 133/77 (!) 142/78  Pulse: 100  94 (!) 114  Resp: (!) 24 16 18 20   Temp: 98.2 F (36.8 C) 100.1 F (37.8 C) (!) 100.8 F (38.2 C) 100.2 F (37.9 C)  TempSrc: Axillary Axillary Axillary Axillary  SpO2: 90% 100% 94% 96%  Weight:      Height:        Intake/Output Summary (Last 24 hours) at 12/22/2017 1042 Last data filed at 12/22/2017 0433 Gross per 24 hour  Intake 89.73 ml  Output 1950 ml  Net -1860.27 ml   Filed Weights   12/17/17 2023 12/20/17 0500 12/20/17 2350  Weight: 54 kg 54.4 kg 57.6 kg    Telemetry    NSR,  heart rate steadily decreasing overnight - Personally Reviewed  ECG    No new tracing - Personally Reviewed  Physical Exam  Lying almost flat, eyes closed GEN: No acute distress.   Neck: JVP 10 cm, almost to earlobe Cardiac: RRR, no murmurs, rubs, or gallops.  Respiratory: Clear to auscultation bilaterally. GI: Soft, nontender, non-distended  MS: No edema; No deformity. Neuro:  Nonfocal  Psych: unable to assess  Labs    Chemistry Recent Labs  Lab 12/16/17 1419 12/19/17 1103 12/19/17 1737 12/20/17 1146 12/21/17 0608 12/22/17 0524  NA 138 138  --   --  140 135  K 4.2 4.5  --   --  3.7 4.3  CL 108 108  --   --  107 101  CO2 26 24  --   --  25 26  GLUCOSE 225* 172*  --   --  58* 187*  BUN 22 22  --   --  24* 30*  CREATININE 1.31* 1.34*  --   --  1.34* 1.52*  CALCIUM 8.9 8.7*  --   --  9.1 8.8*  PROT  --   --   --  5.3*  --   --  ALBUMIN 1.5* 1.5*  --  1.4*  --   --   AST  --   --   --  27  --   --   ALT  --   --   --  21  --   --   ALKPHOS  --   --   --  54  --   --   BILITOT  --   --  0.5 0.5  --   --   GFRNONAA 35* 34*  --   --  34* 29*  GFRAA 41* 40*  --   --  40* 34*  ANIONGAP 4* 6  --   --  8 8     Hematology Recent Labs  Lab 12/19/17 1103 12/20/17 0400 12/21/17 0608  WBC 10.4 8.8 11.7*  RBC 2.67* 2.75* 2.90*  HGB 7.6* 7.7* 8.0*  HCT 24.3* 25.2* 26.7*  MCV 91.0 91.6 92.1  MCH 28.5 28.0 27.6  MCHC 31.3 30.6 30.0  RDW 18.5* 18.6* 18.6*  PLT 284 300 356    Cardiac Enzymes Recent Labs  Lab 12/21/17 0316 12/21/17 0608 12/21/17 1840 12/22/17 0524  TROPONINI 8.87* 12.17* 10.94* 5.12*   No results for input(s): TROPIPOC in the last 168 hours.   BNP Recent Labs  Lab 12/20/17 1450  BNP >4,500.0*     DDimer No results for input(s): DDIMER in the last 168 hours.   Radiology    Dg Chest Port 1 View  Result Date: 12/20/2017 CLINICAL DATA:  Shortness of breath EXAM: PORTABLE CHEST 1 VIEW COMPARISON:  12/15/2017 FINDINGS: Postoperative changes  in the mediastinum. Right PICC line with tip over the low SVC region. No pneumothorax. Cardiac enlargement with pulmonary vascular congestion. Developing interstitial changes in the lungs likely representing edema. No blunting of costophrenic angles. Calcification of the aorta. Degenerative changes in the spine and shoulders. Fracture of the proximal right humerus as seen previously. IMPRESSION: Cardiac enlargement with pulmonary vascular congestion and developing interstitial edema. Electronically Signed   By: Lucienne Capers M.D.   On: 12/20/2017 22:20    Cardiac Studies   Echo 12/21/2017 Left ventricle: The cavity size was normal. Wall thickness was   increased in a pattern of mild LVH. The estimated ejection   fraction was 30%. Diffuse hypokinesis. Features are consistent   with a pseudonormal left ventricular filling pattern, with   concomitant abnormal relaxation and increased filling pressure   (grade 2 diastolic dysfunction). - Aortic valve: There was no stenosis. - Mitral valve: There was mild to moderate regurgitation. - Left atrium: The atrium was mildly dilated. - Right ventricle: The cavity size was normal. Systolic function   was mildly reduced. - Pulmonary arteries: No complete TR doppler jet so unable to   estimate PA systolic pressure. - Inferior vena cava: The vessel was normal in size. The   respirophasic diameter changes were in the normal range (>= 50%),   consistent with normal central venous pressure.   Patient Profile     84 y.o. female with CAD s/p CABG, recent ischemic stroke, HTN, DM, HLP, recent failure to thrive due to swallowing problems s/p botox injection for achalasia, developed acute pulmonary edema and NSTEMI on 12/20/2017.  Assessment & Plan    1. NSTEMI: may have been secondary to volume overload/acute HF rather than the inciting event (no localizing ECG changes, no regional wall motion abnormalities on echo). DC heparin after 72 h, sooner if  there is any bleeding. Continue clopidogrel if there are no  bleeding complications. Hgb stable. On statin. 2. Acute combined systolic and diastolic HF: clearly still hypervolemic, but need to slow down the rate of diuresis since renal function shows slight deterioration. For same reason, hold off ARB. Target weight probably 116 lb or less. 3. Acute on CKD 3:  Monitor daily. Consider ARB if creat returns to baseline around 1.2 -1.3. 4. Recent CVA: restarted clopidogrel.     For questions or updates, please contact [place] Please consult [email or web address] for contact info under        Signed, Sanda Klein, MD  12/22/2017, 10:42 AM

## 2017-12-23 ENCOUNTER — Inpatient Hospital Stay (HOSPITAL_COMMUNITY): Payer: Medicare Other

## 2017-12-23 DIAGNOSIS — I5041 Acute combined systolic (congestive) and diastolic (congestive) heart failure: Secondary | ICD-10-CM

## 2017-12-23 LAB — CBC
HCT: 23.6 % — ABNORMAL LOW (ref 36.0–46.0)
HEMOGLOBIN: 7.3 g/dL — AB (ref 12.0–15.0)
MCH: 28.2 pg (ref 26.0–34.0)
MCHC: 30.9 g/dL (ref 30.0–36.0)
MCV: 91.1 fL (ref 80.0–100.0)
Platelets: 334 10*3/uL (ref 150–400)
RBC: 2.59 MIL/uL — ABNORMAL LOW (ref 3.87–5.11)
RDW: 18.2 % — ABNORMAL HIGH (ref 11.5–15.5)
WBC: 14.9 10*3/uL — AB (ref 4.0–10.5)
nRBC: 0 % (ref 0.0–0.2)

## 2017-12-23 LAB — URINALYSIS, ROUTINE W REFLEX MICROSCOPIC
Bilirubin Urine: NEGATIVE
Glucose, UA: NEGATIVE mg/dL
Ketones, ur: NEGATIVE mg/dL
Nitrite: NEGATIVE
Protein, ur: 100 mg/dL — AB
SPECIFIC GRAVITY, URINE: 1.01 (ref 1.005–1.030)
WBC, UA: >50 WBC/hpf — ABNORMAL HIGH (ref 0–5)
pH: 7 (ref 5.0–8.0)

## 2017-12-23 LAB — OCCULT BLOOD X 1 CARD TO LAB, STOOL: FECAL OCCULT BLD: NEGATIVE

## 2017-12-23 LAB — BLOOD GAS, ARTERIAL
Acid-Base Excess: 2.3 mmol/L — ABNORMAL HIGH (ref 0.0–2.0)
Bicarbonate: 25.7 mmol/L (ref 20.0–28.0)
Drawn by: 511471
O2 Content: 6 L/min
O2 Saturation: 98.3 %
Patient temperature: 98.2
pCO2 arterial: 35.3 mmHg (ref 32.0–48.0)
pH, Arterial: 7.475 — ABNORMAL HIGH (ref 7.350–7.450)
pO2, Arterial: 98.1 mmHg (ref 83.0–108.0)

## 2017-12-23 LAB — BASIC METABOLIC PANEL
ANION GAP: 8 (ref 5–15)
BUN: 34 mg/dL — ABNORMAL HIGH (ref 8–23)
CHLORIDE: 95 mmol/L — AB (ref 98–111)
CO2: 27 mmol/L (ref 22–32)
Calcium: 8.9 mg/dL (ref 8.9–10.3)
Creatinine, Ser: 1.54 mg/dL — ABNORMAL HIGH (ref 0.44–1.00)
GFR calc non Af Amer: 29 mL/min — ABNORMAL LOW (ref >60)
GFR, EST AFRICAN AMERICAN: 34 mL/min — AB (ref >60)
Glucose, Bld: 264 mg/dL — ABNORMAL HIGH (ref 70–99)
POTASSIUM: 4.5 mmol/L (ref 3.5–5.1)
Sodium: 130 mmol/L — ABNORMAL LOW (ref 135–145)

## 2017-12-23 LAB — GLUCOSE, CAPILLARY
Glucose-Capillary: 201 mg/dL — ABNORMAL HIGH (ref 70–99)
Glucose-Capillary: 298 mg/dL — ABNORMAL HIGH (ref 70–99)
Glucose-Capillary: 340 mg/dL — ABNORMAL HIGH (ref 70–99)
Glucose-Capillary: 241 mg/dL — ABNORMAL HIGH (ref 70–99)

## 2017-12-23 LAB — ABO/RH: ABO/RH(D): O POS

## 2017-12-23 LAB — HEPARIN LEVEL (UNFRACTIONATED): Heparin Unfractionated: 0.2 IU/mL — ABNORMAL LOW (ref 0.30–0.70)

## 2017-12-23 LAB — BRAIN NATRIURETIC PEPTIDE

## 2017-12-23 LAB — TROPONIN I: Troponin I: 2.13 ng/mL (ref <0.03)

## 2017-12-23 LAB — PREPARE RBC (CROSSMATCH)

## 2017-12-23 MED ORDER — FUROSEMIDE 10 MG/ML IJ SOLN
40.0000 mg | Freq: Once | INTRAMUSCULAR | Status: AC
  Administered 2017-12-24: 40 mg via INTRAVENOUS
  Filled 2017-12-23: qty 4

## 2017-12-23 MED ORDER — FUROSEMIDE 10 MG/ML IJ SOLN
40.0000 mg | Freq: Once | INTRAMUSCULAR | Status: AC
  Administered 2017-12-23: 40 mg via INTRAVENOUS
  Filled 2017-12-23: qty 4

## 2017-12-23 MED ORDER — CHLORHEXIDINE GLUCONATE CLOTH 2 % EX PADS
6.0000 | MEDICATED_PAD | Freq: Every day | CUTANEOUS | Status: AC
  Administered 2017-12-24 – 2017-12-28 (×5): 6 via TOPICAL

## 2017-12-23 MED ORDER — SODIUM CHLORIDE 0.9% IV SOLUTION: Freq: Once | INTRAVENOUS | Status: DC

## 2017-12-23 MED ORDER — ISOSORBIDE DINITRATE 10 MG PO TABS
10.0000 mg | ORAL_TABLET | Freq: Three times a day (TID) | ORAL | Status: DC
  Administered 2017-12-23 (×3): 10 mg
  Filled 2017-12-23 (×4): qty 1

## 2017-12-23 MED ORDER — FUROSEMIDE 10 MG/ML IJ SOLN: 20.0000 mg | Freq: Once | INTRAMUSCULAR | Status: DC

## 2017-12-23 MED ORDER — MORPHINE SULFATE (PF) 2 MG/ML IV SOLN
2.0000 mg | INTRAVENOUS | Status: DC | PRN
  Administered 2017-12-23 – 2017-12-27 (×15): 2 mg via INTRAVENOUS
  Filled 2017-12-23 (×15): qty 1

## 2017-12-23 MED ORDER — MORPHINE SULFATE (PF) 2 MG/ML IV SOLN
2.0000 mg | Freq: Once | INTRAVENOUS | Status: AC
  Administered 2017-12-23: 2 mg via INTRAVENOUS
  Filled 2017-12-23: qty 1

## 2017-12-23 MED ORDER — LORAZEPAM 2 MG/ML IJ SOLN
INTRAMUSCULAR | Status: AC
  Administered 2017-12-23: 0.25 mg via INTRAVENOUS
  Filled 2017-12-23: qty 1

## 2017-12-23 MED ORDER — CLOPIDOGREL BISULFATE 75 MG PO TABS
75.0000 mg | ORAL_TABLET | Freq: Every day | ORAL | Status: DC
  Administered 2017-12-24 – 2018-02-26 (×65): 75 mg
  Filled 2017-12-23 (×65): qty 1

## 2017-12-23 MED ORDER — MUPIROCIN 2 % EX OINT
1.0000 "application " | TOPICAL_OINTMENT | Freq: Two times a day (BID) | CUTANEOUS | Status: AC
  Administered 2017-12-23 – 2017-12-28 (×10): 1 via NASAL
  Filled 2017-12-23 (×2): qty 22

## 2017-12-23 MED ORDER — FUROSEMIDE 10 MG/ML IJ SOLN
20.0000 mg | Freq: Two times a day (BID) | INTRAMUSCULAR | Status: DC
  Administered 2017-12-23 – 2017-12-25 (×4): 20 mg via INTRAVENOUS
  Filled 2017-12-23 (×4): qty 2

## 2017-12-23 MED ORDER — LORAZEPAM 2 MG/ML IJ SOLN
0.2500 mg | Freq: Once | INTRAMUSCULAR | Status: AC | PRN
  Administered 2017-12-23: 0.25 mg via INTRAVENOUS

## 2017-12-23 NOTE — Progress Notes (Signed)
Paged by bedside RN regarding the pt having a bout of emesis after bolus tube feeds with possible aspiration. After emesis pt began requiring more oxygen increasing from 2L via [place] to 6L via [place]. Also became tachycardic into the 130's and restless. Upon arrival to the room pt sitting upright in bed noted to be using accessory muscles during respirations. Lungs coarse bilaterally. Lethargic but arouses easily to voice and light touch. CXR obtained showing "Cardiac enlargement with pulmonary vascular congestion and pulmonary edema."  ABG was drawn with:  PH: 7.47 PCO2: 35.3 PO2: 98.1 Bicarb: 25.7 02 Sat: 98.3  Spoke with the pts son and he voiced some concern that her NSTEMI maybe worsening as these are the same symptoms she presented with previously. EKG, Troponin's and a BNP had previously been ordered by Dr. Tana Coast.   Assessment and Plan  Possible Aspiration Pneumonia- Start Vanc and Zosyn per pharmacy dosing. Continue to monitor pts respiratory status. Consult PCCM if respiratory status worsens.  NSTEMI possibly due to volume overload/acute HF- Troponin was 2.13 which continues the downward trend as she was previously 5.12. We will continue to cycle her Troponin's. EKG showed no ST segment elevation. Continue Plavix and Atorvastatin.   Acute combinced systolic and diastolic HF- BNP remains elevated at >4,500 and she is still 22L positive. CXR still showing some pulmonary edema. Ordered 40mg  of Lasix IV x 1. Ordered placed to insert foley catheter as pt will not leave purewick in place resulting in inaccurate intake and output.   Family Communication: Spoke with patients son about the event, current plan of care, and current lab results. Also discussed what the steps would be if the pts respiratory status continued to worsen which could result in intubation and being placed on a ventilator. He stated that he would like the pt to be "Full Code" at this time. All concerns and questions were addressed at  this time.  Arby Barrette AGPCNP-BC, AGNP-C [facility] Pager [phone]  CRITICAL CARE Performed by: Vertis Kelch   Total critical care time: 60 minutes  Critical care time was exclusive of separately billable procedures and treating other patients.  Critical care was necessary to treat or prevent imminent or life-threatening deterioration.  Critical care was time spent personally by me on the following activities: development of treatment plan with patient and/or surrogate as well as nursing, discussions with consultants, evaluation of patient's response to treatment, examination of patient, obtaining history from patient or surrogate, ordering and performing treatments and interventions, ordering and review of laboratory studies, ordering and review of radiographic studies, pulse oximetry and re-evaluation of patient's condition.

## 2017-12-23 NOTE — Evaluation (Signed)
Clinical/Bedside Swallow Evaluation Patient Details  Name: [name] MRN: [id number] Date of Birth: September 28, 1927  Today's Date: 12/23/2017 Time: SLP Start Time (ACUTE ONLY): 0845 SLP Stop Time (ACUTE ONLY): 0857 SLP Time Calculation (min) (ACUTE ONLY): 12 min  Past Medical History:  Past Medical History:  Diagnosis Date  . Acute renal injury ([place]) 07/2016  . Anginal pain ([place]) 2004; 2011  . CAD (coronary artery disease)    status post stenting of the RCA status post CABG. (left internal mammary artery to  LAD, saphenous vein graft to second diagonal, saphenous vein  graft to obtuse marginal 1, saphenous vein graft to posterior  descending).  . Cardiomyopathy     Mildly reduced EF (45%).   . Chronic kidney disease (CKD), stage III (moderate) ([place])   . History of blood transfusion 1960s   "related to anemia"  . HTN (hypertension)   . Pleural effusion 2011   S/P "bypass"  . Shortness of breath   . Stroke ([place]) 09/19/2016   "left sided weakness" (09/20/2016)  . Type II diabetes mellitus ([place])    Past Surgical History:  Past Surgical History:  Procedure Laterality Date  . BOTOX INJECTION  11/20/2017   Procedure: BOTOX INJECTION;  Surgeon: Doran Stabler, MD;  Location: [facility];  Service: Gastroenterology;;  . CATARACT EXTRACTION W/ INTRAOCULAR LENS  IMPLANT, BILATERAL Bilateral   . CORONARY ANGIOPLASTY WITH STENT PLACEMENT  2004   "LAD & one of the circumflex"  . CORONARY ARTERY BYPASS GRAFT  2011   CABG X4  . ESOPHAGOGASTRODUODENOSCOPY (EGD) WITH PROPOFOL N/A 11/20/2017   Procedure: ESOPHAGOGASTRODUODENOSCOPY (EGD) WITH PROPOFOL;  Surgeon: Doran Stabler, MD;  Location: [facility];  Service: Gastroenterology;  Laterality: N/A;  . IR GASTROSTOMY TUBE MOD SED  12/10/2017   HPI:  84 y.o. female with CAD s/p CABG, recent ischemic stroke, HTN, DM, HLP, recent failure to thrive due to swallowing problems s/p botox injection for achalasia, developed acute pulmonary  edema and NSTEMI on 12/20/2017. SLP consulted during recent admission; bedside swallow on 11/05/17 revealed functional oropharyngeal swallow and suspected esophageal dysphagia. Had esophagram 11/06/17 which showed moderately large hiatal hernia with poor motility and retained barium throughout the esophagus. CXR 12/20/17 showed no pneumonia; pt has had low-grade fevers.   Assessment / Plan / Recommendation Clinical Impression  Pt is at risk for aspiration regardless of means of nutrition given poor esophageal motility. When seen recently by our services 11/05/17, pt appeared to have normal oropharyngeal function and regular diet with thin liquids was recommended. No family present to confirm; it is unclear whether pt has been taking POs orally since placement of PEG 12/10/17. She appears weak and deconditioned, allowed minimal oral care to remove portions of dried secretions coated along teeth, lingual and palatal surfaces. She is lethargic, but opens her eyes and responds "yes" weakly when asked if she is thirsty. Despite this, she made no attempts to retrieve ice chips when offered or placed in anterior oral cavity, and boluses were removed. Palliative consult would be appropriate. Would continue with feeds/medications via PEG at this time, primarily due to poor alertness/ability to follow commands. Thorough and frequent oral care advised; please set up oral suction as pt unable to follow commands to expectorate. If pt alert and showing interest in POs, may give ice chips after oral care. Will follow up briefly for education, for further assessment of ability to take POs, and to facilitate recommendations for comfort feeding should this fall in line with  family's goals of care.     SLP Visit Diagnosis: Dysphagia, unspecified (R13.10)    Aspiration Risk  Severe aspiration risk    Diet Recommendation Alternative means - long-term;Ice chips PRN after oral care(ice chips if alert and requesting POs)    Liquid Administration via: Spoon Medication Administration: Via alternative means    Other  Recommendations Recommended Consults: Other (Comment)(Palliative consult) Oral Care Recommendations: Oral care QID;Oral care prior to ice chip/H20 Other Recommendations: Have oral suction available   Follow up Recommendations Other (comment)(tbd)      Frequency and Duration min 1 x/week  1 week       Prognosis Prognosis for Safe Diet Advancement: Guarded Barriers to Reach Goals: Other (Comment)(cormorbidities, deconditioning, medical prognosis)      Swallow Study   General Date of Onset: 12/05/17 HPI: 84 y.o. female with CAD s/p CABG, recent ischemic stroke, HTN, DM, HLP, recent failure to thrive due to swallowing problems s/p botox injection for achalasia, developed acute pulmonary edema and NSTEMI on 12/20/2017. SLP consulted during recent admission; bedside swallow on 11/05/17 revealed functional oropharyngeal swallow and suspected esophageal dysphagia. Had esophagram 11/06/17 which showed moderately large hiatal hernia with poor motility and retained barium throughout the esophagus. CXR 12/20/17 showed no pneumonia; pt has had low-grade fevers. Type of Study: Bedside Swallow Evaluation Previous Swallow Assessment: see HPI Diet Prior to this Study: PEG tube Temperature Spikes Noted: (slight, 99.3) Respiratory Status: Nasal cannula History of Recent Intubation: No Behavior/Cognition: Lethargic/Drowsy;Doesn't follow directions Oral Cavity Assessment: Dry;Dried secretions Oral Care Completed by SLP: Yes Oral Cavity - Dentition: Missing dentition Self-Feeding Abilities: Total assist Patient Positioning: Upright in bed Baseline Vocal Quality: Other (comment)(weak) Volitional Cough: Cognitively unable to elicit Volitional Swallow: Unable to elicit    Oral/Motor/Sensory Function Overall Oral Motor/Sensory Function: Generalized oral weakness   Ice Chips Ice chips: Impaired Presentation:  Spoon Oral Phase Impairments: Reduced labial seal;Poor awareness of bolus Oral Phase Functional Implications: Right anterior spillage Pharyngeal Phase Impairments: Other (comments)(does not initiate swallow)   Thin Liquid Thin Liquid: Not tested    Nectar Thick Nectar Thick Liquid: Not tested   Honey Thick Honey Thick Liquid: Not tested   Puree Puree: Not tested   Solid     Deneise Lever, MS, [place] Pager: [phone] Office: [phone]  Solid: Not tested      Aliene Altes 12/23/2017,9:05 AM

## 2017-12-23 NOTE — Progress Notes (Signed)
SLP Contact Note  Patient Details Name: [name] MRN: [id number] DOB: 12/04/1927   RN paged SLP as pt's son requesting swallow re-evaluation and for pt to be given water. SLP had discussion with pt's Dr. Erick Alley re: recommendations given pt's mentation and inability to sustain alertness, posing aspiration risk. Agreed with son that oropharyngeal swallow assessment in October appeared WNL, though pt at risk for aspiration given esophageal dysmotility and her current mentation. Advised pt remain NPO unless able to remain alert, follow commands, and respond to oral stimulus. Will follow up for PO trials pending improvements in mentation. Son verbalized understanding.  Deneise Lever, [place], CCC-SLP Speech-Language Pathologist Acute Rehabilitation Services Pager: [phone] Office: [phone]             Aliene Altes 12/23/2017, 1:50 PM

## 2017-12-23 NOTE — Progress Notes (Signed)
Progress Note  Patient Name: [name] Date of Encounter: 12/23/2017  Primary Cardiologist: Minus Breeding, MD   Subjective   Appears comfortable right now, although not communicative. Yesterday evening she developed progressively worsening sinus tachycardia to 140, now coming down to the 110-120 range. No overt bleeding.  Hemoglobin 7.3, a slight decrease from yesterday 7.9. WBC remains elevated, not really changed from yesterday.  Afebrile.   Weight has reportedly increased 7 pounds since yesterday.  In/out was net neutral.  Inpatient Medications    Scheduled Meds: . sodium chloride   Intravenous Once  . atorvastatin  40 mg Per Tube q1800  . cholecalciferol  400 Units Per Tube Daily  . clopidogrel  75 mg Oral Daily  . feeding supplement (JEVITY 1.5 CAL/FIBER)  237 mL Per Tube QID  . ferrous sulfate  300 mg Per Tube Daily  . free water  250 mL Per Tube Q8H  . furosemide  20 mg Intravenous Once  . furosemide  40 mg Oral Daily  . insulin aspart  0-15 Units Subcutaneous QID  . insulin aspart  3 Units Subcutaneous QID  . insulin glargine  20 Units Subcutaneous QHS  . isosorbide dinitrate  10 mg Per Tube BID  . metoprolol tartrate  75 mg Oral BID  . mirtazapine  7.5 mg Per Tube QHS  . multivitamin  15 mL Per Tube Daily  . pantoprazole sodium  40 mg Per Tube BID  . potassium chloride  20 mEq Per Tube BID  . sennosides  5 mL Per Tube QHS   Continuous Infusions: . heparin 1,200 Units/hr (12/23/17 0555)   PRN Meds: acetaminophen, fentaNYL, hydrALAZINE, ipratropium-albuterol, loperamide HCl, nitroGLYCERIN, ondansetron (ZOFRAN) IV, sodium chloride flush   Vital Signs    Vitals:   12/23/17 0400 12/23/17 0500 12/23/17 0600 12/23/17 0745  BP:    128/75  Pulse:    (!) 124  Resp: 16 (!) 23 (!) 23 (!) 28  Temp:    98 F (36.7 C)  TempSrc:    Axillary  SpO2: 98% 99% 98% 95%  Weight:      Height:        Intake/Output Summary (Last 24 hours) at 12/23/2017 0809 Last  data filed at 12/23/2017 0600 Gross per 24 hour  Intake 1374.06 ml  Output 1150 ml  Net 224.06 ml   Filed Weights   12/17/17 2023 12/20/17 0500 12/20/17 2350  Weight: 54 kg 54.4 kg 57.6 kg    Telemetry    Sinus tachycardia- Personally Reviewed  ECG    No new tracing- Personally Reviewed  Physical Exam  Lying 30 deg HOB elevation, appears relatively comfortable GEN: No acute distress.   Neck:  8 cm JVD Cardiac: RRR, tachycardic, no murmurs, rubs, or gallops.  Respiratory: Clear to auscultation bilaterally. GI: Soft, nontender, non-distended  MS: No edema; No deformity. Neuro:  Nonfocal  Psych: Normal affect   Labs    Chemistry Recent Labs  Lab 12/16/17 1419 12/19/17 1103 12/19/17 1737 12/20/17 1146 12/21/17 0608 12/22/17 0524 12/23/17 0501  NA 138 138  --   --  140 135 130*  K 4.2 4.5  --   --  3.7 4.3 4.5  CL 108 108  --   --  107 101 95*  CO2 26 24  --   --  25 26 27   GLUCOSE 225* 172*  --   --  58* 187* 264*  BUN 22 22  --   --  24* 30* 34*  CREATININE 1.31* 1.34*  --   --  1.34* 1.52* 1.54*  CALCIUM 8.9 8.7*  --   --  9.1 8.8* 8.9  PROT  --   --   --  5.3*  --   --   --   ALBUMIN 1.5* 1.5*  --  1.4*  --   --   --   AST  --   --   --  27  --   --   --   ALT  --   --   --  21  --   --   --   ALKPHOS  --   --   --  54  --   --   --   BILITOT  --   --  0.5 0.5  --   --   --   GFRNONAA 35* 34*  --   --  34* 29* 29*  GFRAA 41* 40*  --   --  40* 34* 34*  ANIONGAP 4* 6  --   --  8 8 8      Hematology Recent Labs  Lab 12/21/17 0608 12/22/17 1219 12/23/17 0501  WBC 11.7* 15.1* 14.9*  RBC 2.90* 2.85* 2.59*  HGB 8.0* 7.9* 7.3*  HCT 26.7* 25.9* 23.6*  MCV 92.1 90.9 91.1  MCH 27.6 27.7 28.2  MCHC 30.0 30.5 30.9  RDW 18.6* 18.4* 18.2*  PLT 356 337 334    Cardiac Enzymes Recent Labs  Lab 12/21/17 0316 12/21/17 0608 12/21/17 1840 12/22/17 0524  TROPONINI 8.87* 12.17* 10.94* 5.12*   No results for input(s): TROPIPOC in the last 168 hours.    BNP Recent Labs  Lab 12/20/17 1450  BNP >4,500.0*     DDimer No results for input(s): DDIMER in the last 168 hours.   Radiology    No results found.  Cardiac Studies   Echo 12/21/2017 Left ventricle: The cavity size was normal. Wall thickness was increased in a pattern of mild LVH. The estimated ejection fraction was 30%. Diffuse hypokinesis. Features are consistent with a pseudonormal left ventricular filling pattern, with concomitant abnormal relaxation and increased filling pressure (grade 2 diastolic dysfunction). - Aortic valve: There was no stenosis. - Mitral valve: There was mild to moderate regurgitation. - Left atrium: The atrium was mildly dilated. - Right ventricle: The cavity size was normal. Systolic function was mildly reduced. - Pulmonary arteries: No complete TR doppler jet so unable to estimate PA systolic pressure. - Inferior vena cava: The vessel was normal in size. The respirophasic diameter changes were in the normal range (>= 50%), consistent with normal central venous pressure.   Patient Profile     84 y.o. female with CAD s/p CABG, recent ischemic stroke, HTN, DM, HLP, recent failure to thrive due to swallowing problems s/p botox injection for achalasia, developed acute pulmonary edema and NSTEMI on 12/20/2017.  Assessment & Plan    1. NSTEMI: may have been secondary to volume overload/acute HF rather than the inciting event (no localizing ECG changes, no regional wall motion abnormalities on echo).  Of course , she also has underlying CAD and its possible that she has worsening coronary insufficiency.  On clopidogrel and on statin. 2. Acute combined systolic and diastolic HF: She appears volume overloaded and this is likely to worsen with blood transfusion.  Diuretics have been prescribed.  Target weight probably 116 lb or less.  Not sure that today's weight is accurate. Nitrates increased to TID. Consider IV NTG if necessary  for  unloading. 3. Acute on CKD 3:  Monitor daily. Consider ARB if creat returns to baseline around 1.2 -1.3. 4. Recent CVA: restarted clopidogrel. 5. Anemia: No overt bleeding and the decrease in hemoglobin is minimal, but I think we will stop the heparin IV.    For questions or updates, please contact [place] Please consult [email or web address] for contact info under        Signed, Sanda Klein, MD  12/23/2017, 8:09 AM

## 2017-12-23 NOTE — Progress Notes (Signed)
[place] for Heparin  Indication: NSTEMI  Allergies  Allergen Reactions  . Contrast Media [Iodinated Diagnostic Agents]     Sensitivity, kidney issues    Patient Measurements: Height: 5\' 3"  (160 cm) Weight: 127 lb (57.6 kg) IBW/kg (Calculated) : 52.4  Vital Signs: Temp: 98.4 F (36.9 C) (11/23 0300) Temp Source: Axillary (11/23 0300) BP: 129/89 (11/23 0300) Pulse Rate: 118 (11/23 0300)  Labs: Recent Labs    12/21/17 0608  12/21/17 1840 12/22/17 0524 12/22/17 0718 12/22/17 1219 12/22/17 1630 12/23/17 0501  HGB 8.0*  --   --   --   --  7.9*  --  7.3*  HCT 26.7*  --   --   --   --  25.9*  --  23.6*  PLT 356  --   --   --   --  337  --  334  HEPARINUNFRC  --    < > 0.39 <0.10* <0.10*  --  0.16* 0.20*  CREATININE 1.34*  --   --  1.52*  --   --   --  1.54*  TROPONINI 12.17*  --  10.94* 5.12*  --   --   --   --    < > = values in this interval not displayed.    Estimated Creatinine Clearance: 20.9 mL/min (A) (by C-G formula based on SCr of 1.54 mg/dL (H)).   Medical History: Past Medical History:  Diagnosis Date  . Acute renal injury ([place]) 07/2016  . Anginal pain ([place]) 2004; 2011  . CAD (coronary artery disease)    status post stenting of the RCA status post CABG. (left internal mammary artery to  LAD, saphenous vein graft to second diagonal, saphenous vein  graft to obtuse marginal 1, saphenous vein graft to posterior  descending).  . Cardiomyopathy     Mildly reduced EF (45%).   . Chronic kidney disease (CKD), stage III (moderate) ([place])   . History of blood transfusion 1960s   "related to anemia"  . HTN (hypertension)   . Pleural effusion 2011   S/P "bypass"  . Shortness of breath   . Stroke ([place]) 09/19/2016   "left sided weakness" (09/20/2016)  . Type II diabetes mellitus (H[facility])      Assessment: 84 y/o F with NSTEMI on heparin. Pt is to be managed with medication management only at this time - heparin planned for  48-72 hours. Initial heparin level therapeutic, now undetectable and confirmed with redraw. No issues with infusion per RN and it has not been stopped overnight.  11/23 AM update: heparin level remains low, no issues per RN.  Goal of Therapy:  Heparin level 0.3-0.7 units/ml Monitor platelets by anticoagulation protocol: Yes   Plan:  -Increase heparin to 1200 units/hr -Recheck heparin level in 8 hours  Narda Bonds, PharmD, BCPS Clinical Pharmacist Phone: [phone]

## 2017-12-23 NOTE — Progress Notes (Signed)
[facility]                                                                              Patient Demographics  [name], is a 84 y.o. female, DOB - Apr 17, 1927, CXK:[id number]  Admit date - 12/05/2017   Admitting Physician Janora Norlander, MD  Outpatient Primary MD for the patient is Deland Pretty, MD  Outpatient specialists:   LOS - 14  days   Medical records reviewed and are as summarized below:    Chief Complaint  Patient presents with  . Altered Mental Status       Brief summary   [name] is a 84 y.o. femalewith medical history significant forDM2, HTN, HLD, CAD s/p CABG, h/o CVA, CKD3. She presented secondary to dysarthria. MRI significant for no stroke. Also with failure to thrive with PEG tube placed. Working up confusion.    Assessment & Plan    Principal Problem: Acute respiratory failure with hypoxia secondary to pulmonary edema, acute combined systolic and diastolic CHF - On 97/02, patient had rapid response with acute pulmonary edema, likely due to volume overload, BNP elevated, placed on IV Lasix.  Patient was found to be 27 L positive, had been receiving tube feeds, free water and IV fluids during hospitalization. -Repeat echo showed EF of 30% with grade 2 diastolic dysfunction, troponins trended up to 12.1, cardiology was consulted -Continue strict I's and O's and daily weights, still 22 L positive   NSTEMI possibly due to volume overload/acute HF -Troponins elevated at 8.87, trended up to 12.1, patient was placed on IV heparin drip and cardiology was consulted. -Recent 2D echo on 11/09/2017 had shown EF of 35 to 50% with grade 1 diastolic dysfunction.  Repeat echo showed EF of 30% with grade 2 diastolic dysfunction, diffuse hypokinesis -Hemoglobin 7.3, no obvious bleeding, will DC heparin drip now -Patient now placed on Plavix and statin.  Active problems Acute metabolic encephalopathy -Patient had presented with dysarthria,  confusion.  After discussion with the patient's son (physician), he states that patient does not have dementia -MRI of the brain showed chronic advanced atherosclerosis disease, chronic lacunar infarcts however no new infarct. -UA was positive 11/5, had shown more than 100,000 colonies of Enterococcus faecalis.  Repeat urine culture on 11/13, showed no growth -LFTs, ammonia level normal.  Neurology evaluation done on 11/20, seen by Dr. Cheral Marker, likely patient has vascular dementia due to chronic infarcts and chronic atherosclerotic disease.  Fevers -Still spiking fevers, 100 F, leukocytosis improving, 11/22 neg so far.   -Repeat chest x-ray on 11/23 showed cardiomegaly with persistent interstitial edema, small bilateral pleural effusions -UA pending, SLP evaluation done, severe aspiration risk  Anemia: Acute on chronic -Baseline appears to be 7.5-8 - Epo level is slightly above normal, hemoglobin down to 7.3 today -Given NSTEMI and tachycardia, DC IV heparin drip and transfuse 2 units packed RBCs with Lasix in between    Diabetes mellitus (HCC) type II, uncontrolled -CBGs elevated this morning, continue Lantus, sliding scale insulin scheduled NovoLog  Failure to thrive, status post PEG tube -Off TPN -Continue tube feeding   Pressure injury Stage II partial-thickness with  shallow open ulcer, red-pink wound bed with slough, medial sacrum, present on admission Wound care per nursing  Subcutaneous nodule; non-incarcerated umbilical hernia probably lymph node Seen by surgery, and recommended no surgical treatment at this time  Goals of care: Patient has been seen by palliative medicine however patient's son had requested to continue full scope of treatment.  Will follow for another 24 hours and if no significant improvement, will discuss with patient's son regarding prognosis, quality of life.  Code Status: Full CODE STATUS DVT Prophylaxis:  SCD's Family Communication: Discussed in  detail with the patient, all imaging results, lab results explained to the patient's son, Dr. Mayer Masker today   Disposition Plan:  Time Spent in minutes 25 minutes  Procedures:  MRI brain 2D echo  Consultants:   Palliative medicine Neurology Cardiology  Antimicrobials:      Medications  Scheduled Meds: . sodium chloride   Intravenous Once  . atorvastatin  40 mg Per Tube q1800  . cholecalciferol  400 Units Per Tube Daily  . [START ON 12/24/2017] clopidogrel  75 mg Per Tube Daily  . feeding supplement (JEVITY 1.5 CAL/FIBER)  237 mL Per Tube QID  . ferrous sulfate  300 mg Per Tube Daily  . free water  250 mL Per Tube Q8H  . furosemide  20 mg Intravenous Q12H  . insulin aspart  0-15 Units Subcutaneous QID  . insulin aspart  3 Units Subcutaneous QID  . insulin glargine  20 Units Subcutaneous QHS  . isosorbide dinitrate  10 mg Per Tube TID  . metoprolol tartrate  75 mg Oral BID  . mirtazapine  7.5 mg Per Tube QHS  . multivitamin  15 mL Per Tube Daily  . pantoprazole sodium  40 mg Per Tube BID  . potassium chloride  20 mEq Per Tube BID  . sennosides  5 mL Per Tube QHS   Continuous Infusions:  PRN Meds:.acetaminophen, fentaNYL, hydrALAZINE, ipratropium-albuterol, loperamide HCl, nitroGLYCERIN, ondansetron (ZOFRAN) IV, sodium chloride flush   Antibiotics   Anti-infectives (From admission, onward)   Start     Dose/Rate Route Frequency Ordered Stop   12/10/17 0919  ceFAZolin (ANCEF) IVPB 1 g/50 mL premix  Status:  Discontinued     over 30 Minutes Intravenous Continuous PRN 12/10/17 0919 12/17/17 1336   12/10/17 0859  ceFAZolin (ANCEF) 2-4 GM/100ML-% IVPB    Note to Pharmacy:  Margaretmary Dys   : cabinet override      12/10/17 0859 12/10/17 2114   12/08/17 2200  ampicillin (OMNIPEN) 1 g in sodium chloride 0.9 % 100 mL IVPB  Status:  Discontinued     1 g 300 mL/hr over 20 Minutes Intravenous Every 8 hours 12/08/17 1535 12/12/17 1837   12/07/17 0600  ceFAZolin (ANCEF) IVPB  2g/100 mL premix     2 g 200 mL/hr over 30 Minutes Intravenous To Short Stay 12/06/17 1219 12/08/17 0600   12/06/17 1200  cefTRIAXone (ROCEPHIN) 1 g in sodium chloride 0.9 % 100 mL IVPB  Status:  Discontinued     1 g 200 mL/hr over 30 Minutes Intravenous Every 24 hours 12/05/17 1947 12/08/17 1535   12/05/17 1445  cefTRIAXone (ROCEPHIN) 1 g in sodium chloride 0.9 % 100 mL IVPB     1 g 200 mL/hr over 30 Minutes Intravenous  Once 12/05/17 1441 12/05/17 1622        Subjective:   Nazia Rhines was seen and examined today.  Somewhat tachycardiac this morning, temp of 100 F.  Difficult obtaining review  of system from the patient.  Objective:   Vitals:   12/23/17 0600 12/23/17 0745 12/23/17 1005 12/23/17 1057  BP:  128/75  (!) 105/53  Pulse:  (!) 124 99 99  Resp: (!) 23 (!) 28 (!) 21 (!) 24  Temp:  98 F (36.7 C) 100 F (37.8 C) 99.2 F (37.3 C)  TempSrc:  Axillary Axillary Axillary  SpO2: 98% 95%    Weight:      Height:        Intake/Output Summary (Last 24 hours) at 12/23/2017 1143 Last data filed at 12/23/2017 0600 Gross per 24 hour  Intake 912.2 ml  Output 650 ml  Net 262.2 ml     Wt Readings from Last 3 Encounters:  12/20/17 57.6 kg  11/24/17 52.9 kg  10/09/17 59.6 kg    Physical Exam  General: Lethargic but arousable, not following commands, confused  Eyes:  HEENT: JVD+  Cardiovascular: S1 S2 auscultated, RRR. No pedal edema b/l  Respiratory: Decreased breath sounds at the bases  Gastrointestinal: Soft, nontender, nondistended, + bowel sounds  Ext: lower extremities and bilateral heel protectors  Neuro: unable to assess  Musculoskeletal: No digital cyanosis, clubbing  Skin: Stage II sacral ulcer  Psych: lethargic     Data Reviewed:  I have personally reviewed following labs and imaging studies  Micro Results Recent Results (from the past 240 hour(s))  Culture, Urine     Status: None   Collection Time: 12/13/17  6:19 PM  Result Value Ref  Range Status   Specimen Description URINE, CLEAN CATCH  Final   Special Requests NONE  Final   Culture   Final    NO GROWTH Performed at [facility], [address], [place], [place] [address]    Report Status 12/14/2017 FINAL  Final  Culture, blood (routine x 2)     Status: None (Preliminary result)   Collection Time: 12/22/17 12:31 PM  Result Value Ref Range Status   Specimen Description BLOOD LEFT ANTECUBITAL  Final   Special Requests   Final    BOTTLES DRAWN AEROBIC AND ANAEROBIC Blood Culture adequate volume   Culture   Final    NO GROWTH <12 HOURS Performed at [facility], [place] [address], [place], [place] [address]    Report Status PENDING  Incomplete  Culture, blood (routine x 2)     Status: None (Preliminary result)   Collection Time: 12/22/17 12:31 PM  Result Value Ref Range Status   Specimen Description BLOOD BLOOD LEFT HAND  Final   Special Requests   Final    BOTTLES DRAWN AEROBIC AND ANAEROBIC Blood Culture results may not be optimal due to an inadequate volume of blood received in culture bottles   Culture   Final    NO GROWTH <12 HOURS Performed at [facility], [place] [address] [place], [place] [address]    Report Status PENDING  Incomplete    Radiology Reports Ct Head Wo Contrast  Result Date: 12/05/2017 CLINICAL DATA:  Altered level of consciousness. EXAM: CT HEAD WITHOUT CONTRAST TECHNIQUE: Contiguous axial images were obtained from the base of the skull through the vertex without intravenous contrast. COMPARISON:  CT scan of September 20, 2016. FINDINGS: Brain: Mild chronic ischemic white matter disease is noted. No mass effect or midline shift is noted. Ventricular size is within normal limits. There is no evidence of mass lesion, hemorrhage or acute infarction. Vascular: No hyperdense vessel or unexpected calcification. Skull: Normal. Negative for  fracture or focal lesion. Sinuses/Orbits: No acute finding. Other: None. IMPRESSION: Mild chronic  ischemic white matter disease. No acute intracranial abnormality seen. Electronically Signed   By: Marijo Conception, M.D.   On: 12/05/2017 12:03   Mr Brain Wo Contrast (neuro Protocol)  Result Date: 12/05/2017 CLINICAL DATA:  84 year old female with altered mental status. EXAM: MRI HEAD WITHOUT CONTRAST TECHNIQUE: Multiplanar, multiecho pulse sequences of the brain and surrounding structures were obtained without intravenous contrast. COMPARISON:  Head CT without contrast 1140 hours today. Brain MRI 09/20/2016. FINDINGS: Brain: No restricted diffusion to suggest acute infarction. No midline shift, mass effect, evidence of mass lesion, ventriculomegaly, extra-axial collection or acute intracranial hemorrhage. Cervicomedullary junction and pituitary are within normal limits. Chronic lacunar infarcts in the brainstem and bilateral deep gray matter nuclei. Chronic confluent bilateral cerebral white matter T2 and FLAIR hyperintensity. Chronic small area of cortical encephalomalacia at the right motor strip. Chronic microhemorrhage in the lateral right thalamus. No new signal abnormality.  Stable cerebral volume since 2018. Vascular: Major intracranial vascular flow voids are stable since 2018. Skull and upper cervical spine: Negative visible cervical spine. Visualized bone marrow signal is within normal limits. Sinuses/Orbits: Stable and negative. Other: Mastoids remain clear. Visible internal auditory structures appear normal. Scalp and face soft tissues appear negative. IMPRESSION: 1.  No acute intracranial abnormality. 2. Advanced chronic ischemic disease appears stable since 2018. Electronically Signed   By: Genevie Ann M.D.   On: 12/05/2017 17:00   Ir Gastrostomy Tube Mod Sed  Result Date: 12/10/2017 INDICATION: 84 year old female with achalasia and inability to eat. She presents for percutaneous gastrostomy tube placement. EXAM: Fluoroscopically guided placement of percutaneous pull-through gastrostomy tube  Interventional Radiologist:  Criselda Peaches, MD MEDICATIONS: 2 g Ancef; Antibiotics were administered within 1 hour of the procedure. Additionally, 1 mg glucagon was administered prior to the procedure. ANESTHESIA/SEDATION: Versed 1.5 mg IV; Fentanyl 50 mcg IV Moderate Sedation Time:  14 minutes The patient was continuously monitored during the procedure by the interventional radiology nurse under my direct supervision. CONTRAST:  15 mL Isovue 370 injected into the gastric lumen. FLUOROSCOPY TIME:  Fluoroscopy Time: 2 minutes 48 seconds (4 mGy). COMPLICATIONS: None immediate. PROCEDURE: Informed written consent was obtained from the patient after a thorough discussion of the procedural risks, benefits and alternatives. All questions were addressed. Maximal Sterile Barrier Technique was utilized including caps, mask, sterile gowns, sterile gloves, sterile drape, hand hygiene and skin antiseptic. A timeout was performed prior to the initiation of the procedure. Maximal barrier sterile technique utilized including caps, mask, sterile gowns, sterile gloves, large sterile drape, hand hygiene, and chlorhexadine skin prep. An angled catheter was advanced over a wire under fluoroscopic guidance through the nose, down the esophagus and into the body of the stomach. The stomach was then insufflated with several 100 ml of air. Fluoroscopy confirmed location of the gastric bubble, as well as inferior displacement of the barium stained colon. Under direct fluoroscopic guidance, a single T-tack was placed, and the anterior gastric wall drawn up against the anterior abdominal wall. Percutaneous access was then obtained into the mid gastric body with an 18 gauge sheath needle. Aspiration of air, and injection of contrast material under fluoroscopy confirmed needle placement. An Amplatz wire was advanced in the gastric body and the access needle exchanged for a 9-French vascular sheath. A snare device was advanced through the  vascular sheath and an Amplatz wire advanced through the angled catheter. The Amplatz wire was successfully snared and  this was pulled up through the esophagus and out the mouth. A 20-French Alinda Dooms MIC-PEG tube was then connected to the snare and pulled through the mouth, down the esophagus, into the stomach and out to the anterior abdominal wall. Hand injection of contrast material confirmed intragastric location. The T-tack retention suture was then cut. The pull through peg tube was then secured with the external bumper and capped. The patient will be observed for several hours with the newly placed tube on low wall suction to evaluate for any post procedure complication. The patient tolerated the procedure well, there is no immediate complication. IMPRESSION: Successful placement of a 20 French pull through gastrostomy tube. Electronically Signed   By: Jacqulynn Cadet M.D.   On: 12/10/2017 09:29   US Abdomen Limited  Result Date: 12/19/2017 CLINICAL DATA:  Periumbilical nodule. EXAM: ULTRASOUND ABDOMEN LIMITED COMPARISON:  CT scan of November 04, 2017. FINDINGS: Limited sonographic evaluation of the periumbilical region demonstrates moderate size hernia. There is solid density within the hernia which may represent bowel loops, although no peristalsis is observed. The possibility of this representing enlarged lymph node or other neoplasm cannot be excluded. IMPRESSION: Moderate size periumbilical hernia is noted. Solid density is noted within the hernia which may represent bowel loop, but enlarged lymph node or neoplasm cannot be excluded. Clinical correlation is recommended. Electronically Signed   By: Marijo Conception, M.D.   On: 12/19/2017 12:44   Dg Chest Port 1 View  Result Date: 12/23/2017 CLINICAL DATA:  Congestive heart failure EXAM: PORTABLE CHEST 1 VIEW COMPARISON:  Chest radiograph 12/20/2017 FINDINGS: Monitoring leads overlie the patient. Right upper extremity PICC line tip projects  over the superior vena cava. Patient status post median sternotomy. Stable cardiac and mediastinal contours. Small bilateral layering pleural effusions. Bilateral mid lower lung heterogeneous opacities. IMPRESSION: Cardiomegaly. Persistent interstitial edema with small bilateral pleural effusions. Electronically Signed   By: Lovey Newcomer M.D.   On: 12/23/2017 11:24   Dg Chest Port 1 View  Result Date: 12/20/2017 CLINICAL DATA:  Shortness of breath EXAM: PORTABLE CHEST 1 VIEW COMPARISON:  12/15/2017 FINDINGS: Postoperative changes in the mediastinum. Right PICC line with tip over the low SVC region. No pneumothorax. Cardiac enlargement with pulmonary vascular congestion. Developing interstitial changes in the lungs likely representing edema. No blunting of costophrenic angles. Calcification of the aorta. Degenerative changes in the spine and shoulders. Fracture of the proximal right humerus as seen previously. IMPRESSION: Cardiac enlargement with pulmonary vascular congestion and developing interstitial edema. Electronically Signed   By: Lucienne Capers M.D.   On: 12/20/2017 22:20   Dg Chest Port 1 View  Result Date: 12/15/2017 CLINICAL DATA:  Decreased breath sounds at left lung base,hx htn,dm,cad,cardiomyopathy EXAM: PORTABLE CHEST 1 VIEW COMPARISON:  Chest x-rays dated 12/05/2017, 10/06/2016 and 05/14/2010. FINDINGS: Heart size and mediastinal contours are stable. New RIGHT-sided PICC line in place with tip at the level of the lower SVC/cavoatrial junction. Lungs appear clear. No pleural effusion or pneumothorax seen. Median sternotomy wires appear intact and stable in alignment. No acute or suspicious osseous finding. IMPRESSION: No active disease. No evidence of pneumonia or pulmonary edema. RIGHT-sided PICC line appears well positioned with tip at the level of the lower SVC/cavoatrial junction. Electronically Signed   By: Franki Cabot M.D.   On: 12/15/2017 11:10   Dg Chest Port 1 View  Result  Date: 12/05/2017 CLINICAL DATA:  Altered mental status, difficulty with speech EXAM: PORTABLE CHEST 1 VIEW COMPARISON:  Portable chest  x-ray of 11/09/2016 FINDINGS: No active infiltrate or effusion is seen. Mediastinal and hilar contours are unremarkable. Mild cardiomegaly is stable as is the ectatic descending thoracic aorta. Posttraumatic degenerative change of the right humeral neck is again noted. IMPRESSION: 1. No active lung disease. 2. Stable mild cardiomegaly. Electronically Signed   By: Ivar Drape M.D.   On: 12/05/2017 10:00   [place] Ekg Site Rite  Result Date: 12/09/2017 If Site Rite image not attached, placement could not be confirmed due to current cardiac rhythm.   Lab Data:  CBC: Recent Labs  Lab 12/19/17 1103 12/20/17 0400 12/21/17 0608 12/22/17 1219 12/23/17 0501  WBC 10.4 8.8 11.7* 15.1* 14.9*  HGB 7.6* 7.7* 8.0* 7.9* 7.3*  HCT 24.3* 25.2* 26.7* 25.9* 23.6*  MCV 91.0 91.6 92.1 90.9 91.1  PLT 284 300 356 337 017   Basic Metabolic Panel: Recent Labs  Lab 12/16/17 1419 12/19/17 1103 12/21/17 0608 12/22/17 0524 12/23/17 0501  NA 138 138 140 135 130*  K 4.2 4.5 3.7 4.3 4.5  CL 108 108 107 101 95*  CO2 26 24 25 26 27   GLUCOSE 225* 172* 58* 187* 264*  BUN 22 22 24* 30* 34*  CREATININE 1.31* 1.34* 1.34* 1.52* 1.54*  CALCIUM 8.9 8.7* 9.1 8.8* 8.9  PHOS 2.2* 3.2  --   --   --    GFR: Estimated Creatinine Clearance: 20.9 mL/min (A) (by C-G formula based on SCr of 1.54 mg/dL (H)). Liver Function Tests: Recent Labs  Lab 12/16/17 1419 12/19/17 1103 12/19/17 1737 12/20/17 1146  AST  --   --   --  27  ALT  --   --   --  21  ALKPHOS  --   --   --  54  BILITOT  --   --  0.5 0.5  PROT  --   --   --  5.3*  ALBUMIN 1.5* 1.5*  --  1.4*   No results for input(s): LIPASE, AMYLASE in the last 168 hours. Recent Labs  Lab 12/20/17 1146  AMMONIA 11   Coagulation Profile: No results for input(s): INR, PROTIME in the last 168 hours. Cardiac Enzymes: Recent Labs  Lab  12/21/17 0316 12/21/17 0608 12/21/17 1840 12/22/17 0524  TROPONINI 8.87* 12.17* 10.94* 5.12*   BNP (last 3 results) No results for input(s): PROBNP in the last 8760 hours. HbA1C: No results for input(s): HGBA1C in the last 72 hours. CBG: Recent Labs  Lab 12/22/17 0319 12/22/17 0830 12/22/17 1515 12/22/17 1819 12/22/17 2130  GLUCAP 215* 153* 167* 252* 226*   Lipid Profile: No results for input(s): CHOL, HDL, LDLCALC, TRIG, CHOLHDL, LDLDIRECT in the last 72 hours. Thyroid Function Tests: No results for input(s): TSH, T4TOTAL, FREET4, T3FREE, THYROIDAB in the last 72 hours. Anemia Panel: No results for input(s): VITAMINB12, FOLATE, FERRITIN, TIBC, IRON, RETICCTPCT in the last 72 hours. Urine analysis:    Component Value Date/Time   COLORURINE YELLOW 12/13/2017 1642   APPEARANCEUR HAZY (A) 12/13/2017 1642   LABSPEC 1.016 12/13/2017 1642   PHURINE 7.0 12/13/2017 1642   GLUCOSEU >=500 (A) 12/13/2017 1642   HGBUR MODERATE (A) 12/13/2017 1642   BILIRUBINUR NEGATIVE 12/13/2017 1642   KETONESUR NEGATIVE 12/13/2017 1642   PROTEINUR 100 (A) 12/13/2017 1642   UROBILINOGEN 0.2 06/30/2011 1214   NITRITE NEGATIVE 12/13/2017 1642   LEUKOCYTESUR MODERATE (A) 12/13/2017 1642     Eben Choinski M.D. [facility] 12/23/2017, 11:43 AM  Pager: [phone] Between 7am to 7pm - call Pager -  [phone]  After 7pm go to [email or web address] - password [email or web address]  Call night coverage person covering after 7pm

## 2017-12-24 DIAGNOSIS — I214 Non-ST elevation (NSTEMI) myocardial infarction: Secondary | ICD-10-CM

## 2017-12-24 DIAGNOSIS — J690 Pneumonitis due to inhalation of food and vomit: Secondary | ICD-10-CM

## 2017-12-24 DIAGNOSIS — I509 Heart failure, unspecified: Secondary | ICD-10-CM

## 2017-12-24 LAB — BASIC METABOLIC PANEL
Anion gap: 10 (ref 5–15)
BUN: 40 mg/dL — ABNORMAL HIGH (ref 8–23)
CHLORIDE: 97 mmol/L — AB (ref 98–111)
CO2: 28 mmol/L (ref 22–32)
CREATININE: 1.77 mg/dL — AB (ref 0.44–1.00)
Calcium: 9.1 mg/dL (ref 8.9–10.3)
GFR, EST AFRICAN AMERICAN: 28 mL/min — AB (ref >60)
GFR, EST NON AFRICAN AMERICAN: 24 mL/min — AB (ref >60)
Glucose, Bld: 128 mg/dL — ABNORMAL HIGH (ref 70–99)
POTASSIUM: 4.5 mmol/L (ref 3.5–5.1)
SODIUM: 135 mmol/L (ref 135–145)

## 2017-12-24 LAB — TROPONIN I
Troponin I: 3.17 ng/mL (ref <0.03)
Troponin I: 3.29 ng/mL (ref <0.03)
Troponin I: 3.08 ng/mL (ref <0.03)

## 2017-12-24 LAB — HEMOGLOBIN A1C
Hgb A1c MFr Bld: 5.9 % — ABNORMAL HIGH (ref 4.8–5.6)
MEAN PLASMA GLUCOSE: 122.63 mg/dL

## 2017-12-24 LAB — CBC
HEMATOCRIT: 30.3 % — AB (ref 36.0–46.0)
Hemoglobin: 9.6 g/dL — ABNORMAL LOW (ref 12.0–15.0)
MCH: 28.2 pg (ref 26.0–34.0)
MCHC: 31.7 g/dL (ref 30.0–36.0)
MCV: 89.1 fL (ref 80.0–100.0)
NRBC: 0 % (ref 0.0–0.2)
Platelets: 302 10*3/uL (ref 150–400)
RBC: 3.4 MIL/uL — ABNORMAL LOW (ref 3.87–5.11)
RDW: 16.8 % — AB (ref 11.5–15.5)
WBC: 14.4 10*3/uL — ABNORMAL HIGH (ref 4.0–10.5)

## 2017-12-24 LAB — GLUCOSE, CAPILLARY
Glucose-Capillary: 100 mg/dL — ABNORMAL HIGH (ref 70–99)
Glucose-Capillary: 87 mg/dL (ref 70–99)
Glucose-Capillary: 107 mg/dL — ABNORMAL HIGH (ref 70–99)
Glucose-Capillary: 218 mg/dL — ABNORMAL HIGH (ref 70–99)
Glucose-Capillary: 189 mg/dL — ABNORMAL HIGH (ref 70–99)

## 2017-12-24 MED ORDER — LEVALBUTEROL HCL 0.63 MG/3ML IN NEBU
0.6300 mg | INHALATION_SOLUTION | Freq: Four times a day (QID) | RESPIRATORY_TRACT | Status: DC
  Administered 2017-12-24 (×2): 0.63 mg via RESPIRATORY_TRACT
  Filled 2017-12-24 (×2): qty 3

## 2017-12-24 MED ORDER — LEVALBUTEROL HCL 0.63 MG/3ML IN NEBU
0.6300 mg | INHALATION_SOLUTION | RESPIRATORY_TRACT | Status: DC | PRN
  Administered 2017-12-27 – 2018-01-06 (×2): 0.63 mg via RESPIRATORY_TRACT
  Filled 2017-12-24 (×3): qty 3

## 2017-12-24 MED ORDER — LEVALBUTEROL HCL 0.63 MG/3ML IN NEBU: 0.6300 mg | INHALATION_SOLUTION | RESPIRATORY_TRACT | Status: DC

## 2017-12-24 MED ORDER — IPRATROPIUM BROMIDE 0.02 % IN SOLN
0.5000 mg | Freq: Three times a day (TID) | RESPIRATORY_TRACT | Status: DC
  Administered 2017-12-25 – 2017-12-31 (×19): 0.5 mg via RESPIRATORY_TRACT
  Filled 2017-12-24 (×19): qty 2.5

## 2017-12-24 MED ORDER — METHYLPREDNISOLONE SODIUM SUCC 40 MG IJ SOLR
40.0000 mg | Freq: Two times a day (BID) | INTRAMUSCULAR | Status: DC
  Administered 2017-12-24 – 2017-12-27 (×8): 40 mg via INTRAVENOUS
  Filled 2017-12-24 (×8): qty 1

## 2017-12-24 MED ORDER — LEVALBUTEROL HCL 0.63 MG/3ML IN NEBU
0.6300 mg | INHALATION_SOLUTION | RESPIRATORY_TRACT | Status: DC
  Administered 2017-12-24: 0.63 mg via RESPIRATORY_TRACT
  Filled 2017-12-24: qty 3

## 2017-12-24 MED ORDER — INSULIN ASPART 100 UNIT/ML ~~LOC~~ SOLN
0.0000 [IU] | SUBCUTANEOUS | Status: DC
  Administered 2017-12-24: 3 [IU] via SUBCUTANEOUS
  Administered 2017-12-24 – 2017-12-25 (×2): 5 [IU] via SUBCUTANEOUS
  Administered 2017-12-25: 3 [IU] via SUBCUTANEOUS
  Administered 2017-12-25: 8 [IU] via SUBCUTANEOUS
  Administered 2017-12-25: 5 [IU] via SUBCUTANEOUS
  Administered 2017-12-25: 3 [IU] via SUBCUTANEOUS
  Administered 2017-12-25 – 2017-12-26 (×3): 5 [IU] via SUBCUTANEOUS
  Administered 2017-12-26: 3 [IU] via SUBCUTANEOUS
  Administered 2017-12-26 (×2): 5 [IU] via SUBCUTANEOUS
  Administered 2017-12-27: 2 [IU] via SUBCUTANEOUS
  Administered 2017-12-27 (×2): 5 [IU] via SUBCUTANEOUS
  Administered 2017-12-27: 3 [IU] via SUBCUTANEOUS
  Administered 2017-12-27: 5 [IU] via SUBCUTANEOUS
  Administered 2017-12-27: 2 [IU] via SUBCUTANEOUS
  Administered 2017-12-28 (×4): 3 [IU] via SUBCUTANEOUS
  Administered 2017-12-28: 2 [IU] via SUBCUTANEOUS
  Administered 2017-12-28: 3 [IU] via SUBCUTANEOUS
  Administered 2017-12-29 (×4): 2 [IU] via SUBCUTANEOUS
  Administered 2017-12-29: 3 [IU] via SUBCUTANEOUS
  Administered 2017-12-30 – 2017-12-31 (×4): 2 [IU] via SUBCUTANEOUS
  Administered 2017-12-31: 3 [IU] via SUBCUTANEOUS
  Administered 2017-12-31 – 2018-01-01 (×3): 2 [IU] via SUBCUTANEOUS
  Administered 2018-01-01 (×2): 3 [IU] via SUBCUTANEOUS
  Administered 2018-01-02 (×2): 5 [IU] via SUBCUTANEOUS
  Administered 2018-01-03: 3 [IU] via SUBCUTANEOUS
  Administered 2018-01-04 – 2018-01-05 (×4): 2 [IU] via SUBCUTANEOUS
  Administered 2018-01-06 (×2): 3 [IU] via SUBCUTANEOUS
  Administered 2018-01-06 – 2018-01-07 (×3): 2 [IU] via SUBCUTANEOUS
  Administered 2018-01-07 (×4): 3 [IU] via SUBCUTANEOUS
  Administered 2018-01-08 – 2018-01-10 (×7): 2 [IU] via SUBCUTANEOUS
  Administered 2018-01-11 (×3): 5 [IU] via SUBCUTANEOUS
  Administered 2018-01-11: 3 [IU] via SUBCUTANEOUS
  Administered 2018-01-12: 8 [IU] via SUBCUTANEOUS
  Administered 2018-01-12 (×3): 2 [IU] via SUBCUTANEOUS
  Administered 2018-01-12 – 2018-01-13 (×3): 3 [IU] via SUBCUTANEOUS
  Administered 2018-01-13: 5 [IU] via SUBCUTANEOUS
  Administered 2018-01-13: 2 [IU] via SUBCUTANEOUS
  Administered 2018-01-13: 3 [IU] via SUBCUTANEOUS
  Administered 2018-01-13: 5 [IU] via SUBCUTANEOUS
  Administered 2018-01-14: 3 [IU] via SUBCUTANEOUS
  Administered 2018-01-14 (×2): 5 [IU] via SUBCUTANEOUS
  Administered 2018-01-14: 3 [IU] via SUBCUTANEOUS
  Administered 2018-01-14 – 2018-01-15 (×4): 5 [IU] via SUBCUTANEOUS
  Administered 2018-01-15: 3 [IU] via SUBCUTANEOUS
  Administered 2018-01-15: 2 [IU] via SUBCUTANEOUS
  Administered 2018-01-15 (×2): 3 [IU] via SUBCUTANEOUS
  Administered 2018-01-15: 5 [IU] via SUBCUTANEOUS
  Administered 2018-01-16 (×3): 3 [IU] via SUBCUTANEOUS
  Administered 2018-01-17 – 2018-01-18 (×4): 2 [IU] via SUBCUTANEOUS
  Administered 2018-01-18: 3 [IU] via SUBCUTANEOUS
  Administered 2018-01-19: 2 [IU] via SUBCUTANEOUS
  Administered 2018-01-19: 3 [IU] via SUBCUTANEOUS
  Administered 2018-01-19 – 2018-01-20 (×2): 2 [IU] via SUBCUTANEOUS
  Administered 2018-01-20: 3 [IU] via SUBCUTANEOUS
  Administered 2018-01-20 (×2): 2 [IU] via SUBCUTANEOUS
  Administered 2018-01-20 – 2018-01-21 (×3): 3 [IU] via SUBCUTANEOUS
  Administered 2018-01-21: 5 [IU] via SUBCUTANEOUS
  Administered 2018-01-21: 2 [IU] via SUBCUTANEOUS
  Administered 2018-01-21 – 2018-01-22 (×7): 3 [IU] via SUBCUTANEOUS
  Administered 2018-01-22: 2 [IU] via SUBCUTANEOUS
  Administered 2018-01-23: 3 [IU] via SUBCUTANEOUS
  Administered 2018-01-23: 5 [IU] via SUBCUTANEOUS
  Administered 2018-01-23 (×3): 3 [IU] via SUBCUTANEOUS
  Administered 2018-01-23: 2 [IU] via SUBCUTANEOUS
  Administered 2018-01-24 (×5): 3 [IU] via SUBCUTANEOUS
  Administered 2018-01-25: 5 [IU] via SUBCUTANEOUS
  Administered 2018-01-25 (×3): 3 [IU] via SUBCUTANEOUS
  Administered 2018-01-26: 2 [IU] via SUBCUTANEOUS
  Administered 2018-01-26 (×2): 3 [IU] via SUBCUTANEOUS
  Administered 2018-01-26: 2 [IU] via SUBCUTANEOUS
  Administered 2018-01-26: 5 [IU] via SUBCUTANEOUS
  Administered 2018-01-27: 3 [IU] via SUBCUTANEOUS
  Administered 2018-01-27 (×3): 2 [IU] via SUBCUTANEOUS
  Administered 2018-01-27: 3 [IU] via SUBCUTANEOUS
  Administered 2018-01-28: 2 [IU] via SUBCUTANEOUS
  Administered 2018-01-28 – 2018-01-29 (×4): 3 [IU] via SUBCUTANEOUS
  Administered 2018-01-29: 2 [IU] via SUBCUTANEOUS
  Administered 2018-01-29: 3 [IU] via SUBCUTANEOUS
  Administered 2018-01-29: 2 [IU] via SUBCUTANEOUS
  Administered 2018-01-30 (×3): 3 [IU] via SUBCUTANEOUS
  Administered 2018-01-30: 5 [IU] via SUBCUTANEOUS
  Administered 2018-01-30 – 2018-01-31 (×4): 2 [IU] via SUBCUTANEOUS
  Administered 2018-01-31: 3 [IU] via SUBCUTANEOUS
  Administered 2018-02-01 (×2): 2 [IU] via SUBCUTANEOUS
  Administered 2018-02-01: 3 [IU] via SUBCUTANEOUS
  Administered 2018-02-01: 5 [IU] via SUBCUTANEOUS
  Administered 2018-02-01: 2 [IU] via SUBCUTANEOUS
  Administered 2018-02-01: 5 [IU] via SUBCUTANEOUS
  Administered 2018-02-01: 3 [IU] via SUBCUTANEOUS
  Administered 2018-02-02: 2 [IU] via SUBCUTANEOUS
  Administered 2018-02-02: 3 [IU] via SUBCUTANEOUS
  Administered 2018-02-02: 5 [IU] via SUBCUTANEOUS
  Administered 2018-02-02 (×2): 3 [IU] via SUBCUTANEOUS
  Administered 2018-02-03: 2 [IU] via SUBCUTANEOUS
  Administered 2018-02-03 (×3): 3 [IU] via SUBCUTANEOUS
  Administered 2018-02-03: 2 [IU] via SUBCUTANEOUS
  Administered 2018-02-04: 3 [IU] via SUBCUTANEOUS
  Administered 2018-02-04: 2 [IU] via SUBCUTANEOUS
  Administered 2018-02-04 (×2): 3 [IU] via SUBCUTANEOUS
  Administered 2018-02-06 (×2): 2 [IU] via SUBCUTANEOUS
  Administered 2018-02-07: 3 [IU] via SUBCUTANEOUS
  Administered 2018-02-07 – 2018-02-09 (×5): 2 [IU] via SUBCUTANEOUS
  Administered 2018-02-09 – 2018-02-10 (×2): 3 [IU] via SUBCUTANEOUS
  Administered 2018-02-10 – 2018-02-13 (×7): 2 [IU] via SUBCUTANEOUS
  Administered 2018-02-13: 3 [IU] via SUBCUTANEOUS
  Administered 2018-02-13 (×3): 2 [IU] via SUBCUTANEOUS
  Administered 2018-02-14 (×2): 3 [IU] via SUBCUTANEOUS
  Administered 2018-02-14 – 2018-02-16 (×5): 2 [IU] via SUBCUTANEOUS
  Administered 2018-02-17 (×2): 3 [IU] via SUBCUTANEOUS
  Administered 2018-02-17 – 2018-02-18 (×7): 2 [IU] via SUBCUTANEOUS
  Administered 2018-02-19 (×2): 3 [IU] via SUBCUTANEOUS
  Administered 2018-02-19: 2 [IU] via SUBCUTANEOUS
  Administered 2018-02-19 – 2018-02-20 (×4): 3 [IU] via SUBCUTANEOUS
  Administered 2018-02-20 (×2): 2 [IU] via SUBCUTANEOUS

## 2017-12-24 MED ORDER — NITROGLYCERIN IN D5W 200-5 MCG/ML-% IV SOLN
5.0000 ug/min | INTRAVENOUS | Status: DC
  Administered 2017-12-24: 5 ug/min via INTRAVENOUS
  Filled 2017-12-24: qty 250

## 2017-12-24 MED ORDER — JEVITY 1.2 CAL PO LIQD
1000.0000 mL | ORAL | Status: DC
  Administered 2017-12-24: 1000 mL
  Filled 2017-12-24: qty 1000

## 2017-12-24 MED ORDER — IPRATROPIUM BROMIDE 0.02 % IN SOLN
0.5000 mg | Freq: Four times a day (QID) | RESPIRATORY_TRACT | Status: DC
  Administered 2017-12-24 (×2): 0.5 mg via RESPIRATORY_TRACT
  Filled 2017-12-24 (×2): qty 2.5

## 2017-12-24 MED ORDER — LEVALBUTEROL HCL 0.63 MG/3ML IN NEBU
0.6300 mg | INHALATION_SOLUTION | Freq: Three times a day (TID) | RESPIRATORY_TRACT | Status: DC
  Administered 2017-12-25 – 2017-12-31 (×19): 0.63 mg via RESPIRATORY_TRACT
  Filled 2017-12-24 (×19): qty 3

## 2017-12-24 MED ORDER — VANCOMYCIN HCL 500 MG IV SOLR
500.0000 mg | INTRAVENOUS | Status: DC
  Administered 2017-12-24 (×2): 500 mg via INTRAVENOUS
  Filled 2017-12-24 (×2): qty 500

## 2017-12-24 MED ORDER — IPRATROPIUM BROMIDE 0.02 % IN SOLN
0.5000 mg | RESPIRATORY_TRACT | Status: DC
  Administered 2017-12-24: 0.5 mg via RESPIRATORY_TRACT
  Filled 2017-12-24: qty 2.5

## 2017-12-24 MED ORDER — IPRATROPIUM BROMIDE 0.02 % IN SOLN: 0.5000 mg | Freq: Four times a day (QID) | RESPIRATORY_TRACT | Status: DC

## 2017-12-24 MED ORDER — ALTEPLASE 2 MG IJ SOLR
2.0000 mg | Freq: Once | INTRAMUSCULAR | Status: AC
  Administered 2018-01-03: 2 mg
  Filled 2017-12-24 (×2): qty 2

## 2017-12-24 MED ORDER — PIPERACILLIN-TAZOBACTAM 3.375 G IVPB
3.3750 g | Freq: Three times a day (TID) | INTRAVENOUS | Status: DC
  Administered 2017-12-24 – 2017-12-25 (×6): 3.375 g via INTRAVENOUS
  Filled 2017-12-24 (×6): qty 50

## 2017-12-24 NOTE — Progress Notes (Signed)
Pharmacy Antibiotic Note  [name] is a 84 y.o. female admitted on 12/05/2017 with altered mental status.  Pharmacy has been consulted for Vancomycin/Zosyn dosing for PNA. WBC increased. Noted renal dysfunction.   Plan: Vancomycin 500 mg IV q24h Zosyn 3.375G IV q8h to be infused over 4 hours Trend WBC, temp, renal function  F/U infectious work-up Drug levels as indicated  Height: 5\' 3"  (160 cm) Weight: 127 lb (57.6 kg) IBW/kg (Calculated) : 52.4  Temp (24hrs), Avg:98.5 F (36.9 C), Min:97.8 F (36.6 C), Max:100 F (37.8 C)  Recent Labs  Lab 12/19/17 1103 12/20/17 0400 12/21/17 0608 12/22/17 0524 12/22/17 1219 12/23/17 0501  WBC 10.4 8.8 11.7*  --  15.1* 14.9*  CREATININE 1.34*  --  1.34* 1.52*  --  1.54*    Estimated Creatinine Clearance: 20.9 mL/min (A) (by C-G formula based on SCr of 1.54 mg/dL (H)).    Allergies  Allergen Reactions  . Contrast Media [Iodinated Diagnostic Agents]     Sensitivity, kidney issues    Narda Bonds 12/24/2017 12:17 AM

## 2017-12-24 NOTE — Progress Notes (Signed)
Patient resting comfortably on 2L [place] with no respiratory distress noted. BIPAP is not needed at this time. RT will monitor as needed. 

## 2017-12-24 NOTE — Progress Notes (Addendum)
[facility]                                                                              Patient Demographics  [name], is a 84 y.o. female, DOB - 28-Oct-1927, TDV:[id number]  Admit date - 12/05/2017   Admitting Physician Janora Norlander, MD  Outpatient Primary MD for the patient is Deland Pretty, MD  Outpatient specialists:   LOS - 15  days   Medical records reviewed and are as summarized below:    Chief Complaint  Patient presents with  . Altered Mental Status       Brief summary   [name] is a 84 y.o. femalewith medical history significant forDM2, HTN, HLD, CAD s/p CABG, h/o CVA, CKD3. She presented secondary to dysarthria. MRI significant for no stroke. Also with failure to thrive with PEG tube placed. Working up confusion.    Assessment & Plan    Principal Problem: Acute respiratory failure with hypoxia secondary to pulmonary edema, acute combined systolic and diastolic CHF - On 06/26, patient had rapid response with acute pulmonary edema, likely due to volume overload, BNP elevated, placed on IV Lasix.  Patient was found to be 27 L positive, had been receiving tube feeds, free water and IV fluids during hospitalization. -Repeat echo showed EF of 30% with grade 2 diastolic dysfunction, troponins trended up to 12.1 - overnight issues noted, patient had vomiting and possible aspiration event, worsening CHF on x-ray. -Cardiology following, patient had received multiple doses of Lasix yesterday with RBC transfusion. -Placed on scheduled Xopenex and Atrovent nebs, low-dose Solu-Medrol. -Continue IV vancomycin and Zosyn   NSTEMI possibly due to volume overload/acute HF -Troponins elevated at 8.87, trended up to 12.1, patient was placed on IV heparin drip and cardiology was consulted. -Recent 2D echo on 11/09/2017 had shown EF of 35 to 50% with grade 1 diastolic dysfunction.  Repeat echo showed EF of 30% with grade 2 diastolic dysfunction, diffuse  hypokinesis -Heparin drip DC'd, discussed with cardiology, Dr. Sallyanne Kuster, starting nitro drip, continue Plavix and statin.  Active problems Acute metabolic encephalopathy -Patient had presented with dysarthria, confusion.  After discussion with the patient's son (physician), he states that patient does not have dementia -MRI of the brain showed chronic advanced atherosclerosis disease, chronic lacunar infarcts however no new infarct. -UA was positive 11/5, had shown more than 100,000 colonies of Enterococcus faecalis.  Repeat urine culture on 11/13, showed no growth -LFTs, ammonia level normal.  Neurology evaluation done on 11/20, seen by Dr. Cheral Marker, likely patient has vascular dementia due to chronic infarcts and chronic atherosclerotic disease. -Patient remains somnolent and lethargic, however per son at the bedside had just received morphine.  Aspiration PNA -Continue IV vancomycin and Zosyn. -Aspiration precaution  Anemia: Acute on chronic -Baseline appears to be 7.5-8 - Epo level is slightly above normal, received 2 units packed RBC transfusion for hemoglobin of 7.3 on 11/23 given NSTEMI and demand ischemia.  Heparin drip was discontinued. -Hemoglobin 9.6 today    Diabetes mellitus (HCC) type II, uncontrolled -Change sliding scale insulin to every 4 hours, continue Lantus  Failure to thrive, status post PEG tube,  dysphagia -Off TPN -Placed on continuous tube feeds rather than bolus feeding.  Will start at 20 cc an hour, increase by 10cc/hr every 6 hours   Pressure injury Stage II partial-thickness with shallow open ulcer, red-pink wound bed with slough, medial sacrum, present on admission Wound care per nursing  Subcutaneous nodule; non-incarcerated umbilical hernia probably lymph node Seen by surgery, and recommended no surgical treatment at this time  Goals of care: Patient has been seen by palliative medicine however patient's son had requested to continue full scope of  treatment.  Discussed with patient's son at the bedside, requested we diurese her further and hopefully she will turn around, refused palliative care.  Addendum: 4:15pm  I was called by RN that patient's son wants me to order a test to rule out botulism. I have talked to microbes labs and ID on call, Dr Johnnye Sima, the botox injection is a toxin and not a live bacteria and hence it is highly unlikely to have botulism from the botox injection that she had recieved for achalasia in 10/2017. Microbes lab will assist me to order the lab if there is any association between the two.   Code Status: Full CODE STATUS DVT Prophylaxis:  SCD's Family Communication: Discussed in detail with the patient, all imaging results, lab results explained to the patient's son, Dr. Mayer Masker today   Disposition Plan:  Time Spent in minutes 25 minutes  Procedures:  MRI brain 2D echo  Consultants:   Palliative medicine Neurology Cardiology  Antimicrobials:      Medications  Scheduled Meds: . sodium chloride   Intravenous Once  . alteplase  2 mg Intracatheter Once  . atorvastatin  40 mg Per Tube q1800  . Chlorhexidine Gluconate Cloth  6 each Topical Q0600  . cholecalciferol  400 Units Per Tube Daily  . clopidogrel  75 mg Per Tube Daily  . ferrous sulfate  300 mg Per Tube Daily  . free water  250 mL Per Tube Q8H  . furosemide  20 mg Intravenous Q12H  . insulin aspart  0-15 Units Subcutaneous QID  . insulin aspart  3 Units Subcutaneous QID  . insulin glargine  20 Units Subcutaneous QHS  . levalbuterol  0.63 mg Nebulization Q4H   And  . ipratropium  0.5 mg Nebulization Q4H  . methylPREDNISolone (SOLU-MEDROL) injection  40 mg Intravenous Q12H  . metoprolol tartrate  75 mg Oral BID  . mirtazapine  7.5 mg Per Tube QHS  . multivitamin  15 mL Per Tube Daily  . mupirocin ointment  1 application Nasal BID  . pantoprazole sodium  40 mg Per Tube BID  . potassium chloride  20 mEq Per Tube BID  . sennosides  5 mL  Per Tube QHS   Continuous Infusions: . feeding supplement (JEVITY 1.2 CAL) 1,000 mL (12/24/17 0842)  . nitroGLYCERIN 5 mcg/min (12/24/17 0847)  . piperacillin-tazobactam (ZOSYN)  IV 12.5 mL/hr at 12/24/17 0800  . vancomycin Stopped (12/24/17 0700)   PRN Meds:.acetaminophen, fentaNYL, hydrALAZINE, levalbuterol, loperamide HCl, morphine injection, nitroGLYCERIN, ondansetron (ZOFRAN) IV, sodium chloride flush   Antibiotics   Anti-infectives (From admission, onward)   Start     Dose/Rate Route Frequency Ordered Stop   12/24/17 0030  vancomycin (VANCOCIN) 500 mg in sodium chloride 0.9 % 100 mL IVPB     500 mg 100 mL/hr over 60 Minutes Intravenous Every 24 hours 12/24/17 0017     12/24/17 0030  piperacillin-tazobactam (ZOSYN) IVPB 3.375 g     3.375 g 12.5  mL/hr over 240 Minutes Intravenous Every 8 hours 12/24/17 0017     12/10/17 0919  ceFAZolin (ANCEF) IVPB 1 g/50 mL premix  Status:  Discontinued     over 30 Minutes Intravenous Continuous PRN 12/10/17 0919 12/17/17 1336   12/10/17 0859  ceFAZolin (ANCEF) 2-4 GM/100ML-% IVPB    Note to Pharmacy:  Margaretmary Dys   : cabinet override      12/10/17 0859 12/10/17 2114   12/08/17 2200  ampicillin (OMNIPEN) 1 g in sodium chloride 0.9 % 100 mL IVPB  Status:  Discontinued     1 g 300 mL/hr over 20 Minutes Intravenous Every 8 hours 12/08/17 1535 12/12/17 1837   12/07/17 0600  ceFAZolin (ANCEF) IVPB 2g/100 mL premix     2 g 200 mL/hr over 30 Minutes Intravenous To Short Stay 12/06/17 1219 12/08/17 0600   12/06/17 1200  cefTRIAXone (ROCEPHIN) 1 g in sodium chloride 0.9 % 100 mL IVPB  Status:  Discontinued     1 g 200 mL/hr over 30 Minutes Intravenous Every 24 hours 12/05/17 1947 12/08/17 1535   12/05/17 1445  cefTRIAXone (ROCEPHIN) 1 g in sodium chloride 0.9 % 100 mL IVPB     1 g 200 mL/hr over 30 Minutes Intravenous  Once 12/05/17 1441 12/05/17 1622        Subjective:   [name] was seen and examined today.  Overnight events noted,  currently somnolent and lethargic.  Per patient son at the bedside, patient had also received morphine earlier this morning.  No nausea, vomiting, diarrhea.    Objective:   Vitals:   12/24/17 0900 12/24/17 0930 12/24/17 1000 12/24/17 1100  BP:   132/69 100/67  Pulse:      Resp: (!) 23  19 14   Temp:      TempSrc:      SpO2: 98% 97%  100%  Weight: 64 kg     Height:        Intake/Output Summary (Last 24 hours) at 12/24/2017 1122 Last data filed at 12/24/2017 0800 Gross per 24 hour  Intake 590.76 ml  Output 900 ml  Net -309.24 ml     Wt Readings from Last 3 Encounters:  12/24/17 64 kg  11/24/17 52.9 kg  10/09/17 59.6 kg   Physical Exam  General: Somnolent  Eyes: PERRLA, EOMI, Anicteric Sclera,  HEENT JVD  Cardiovascular: S1 S2 auscultated, no rubs, murmurs or gallops. Regular rate and rhythm. No pedal edema b/l  Respiratory: Clear to auscultation bilaterally, no wheezing, rales or rhonchi  Gastrointestinal: Soft, nontender, nondistended, + bowel sounds  Ext: no pedal edema bilaterally  Neuro: Unable to assess  Musculoskeletal: No digital cyanosis, clubbing  Skin: Stage II sacral ulcer  Psych: Somnolent     Data Reviewed:  I have personally reviewed following labs and imaging studies  Micro Results Recent Results (from the past 240 hour(s))  Culture, blood (routine x 2)     Status: None (Preliminary result)   Collection Time: 12/22/17 12:31 PM  Result Value Ref Range Status   Specimen Description BLOOD LEFT ANTECUBITAL  Final   Special Requests   Final    BOTTLES DRAWN AEROBIC AND ANAEROBIC Blood Culture adequate volume   Culture   Final    NO GROWTH 1 DAY Performed at [facility], [address], [place],  [address]    Report Status PENDING  Incomplete  Culture, blood (routine x 2)     Status: None (Preliminary result)   Collection Time:  12/22/17 12:31 PM  Result Value Ref Range Status   Specimen Description BLOOD BLOOD LEFT HAND   Final   Special Requests   Final    BOTTLES DRAWN AEROBIC AND ANAEROBIC Blood Culture results may not be optimal due to an inadequate volume of blood received in culture bottles   Culture   Final    NO GROWTH 1 DAY Performed at [facility], [address], [place], [place] [address]    Report Status PENDING  Incomplete    Radiology Reports Ct Head Wo Contrast  Result Date: 12/05/2017 CLINICAL DATA:  Altered level of consciousness. EXAM: CT HEAD WITHOUT CONTRAST TECHNIQUE: Contiguous axial images were obtained from the base of the skull through the vertex without intravenous contrast. COMPARISON:  CT scan of September 20, 2016. FINDINGS: Brain: Mild chronic ischemic white matter disease is noted. No mass effect or midline shift is noted. Ventricular size is within normal limits. There is no evidence of mass lesion, hemorrhage or acute infarction. Vascular: No hyperdense vessel or unexpected calcification. Skull: Normal. Negative for fracture or focal lesion. Sinuses/Orbits: No acute finding. Other: None. IMPRESSION: Mild chronic ischemic white matter disease. No acute intracranial abnormality seen. Electronically Signed   By: Marijo Conception, M.D.   On: 12/05/2017 12:03   Mr Brain Wo Contrast (neuro Protocol)  Result Date: 12/05/2017 CLINICAL DATA:  84 year old female with altered mental status. EXAM: MRI HEAD WITHOUT CONTRAST TECHNIQUE: Multiplanar, multiecho pulse sequences of the brain and surrounding structures were obtained without intravenous contrast. COMPARISON:  Head CT without contrast 1140 hours today. Brain MRI 09/20/2016. FINDINGS: Brain: No restricted diffusion to suggest acute infarction. No midline shift, mass effect, evidence of mass lesion, ventriculomegaly, extra-axial collection or acute intracranial hemorrhage. Cervicomedullary junction and pituitary are within normal limits. Chronic lacunar infarcts in the brainstem and bilateral deep gray matter nuclei. Chronic confluent  bilateral cerebral white matter T2 and FLAIR hyperintensity. Chronic small area of cortical encephalomalacia at the right motor strip. Chronic microhemorrhage in the lateral right thalamus. No new signal abnormality.  Stable cerebral volume since 2018. Vascular: Major intracranial vascular flow voids are stable since 2018. Skull and upper cervical spine: Negative visible cervical spine. Visualized bone marrow signal is within normal limits. Sinuses/Orbits: Stable and negative. Other: Mastoids remain clear. Visible internal auditory structures appear normal. Scalp and face soft tissues appear negative. IMPRESSION: 1.  No acute intracranial abnormality. 2. Advanced chronic ischemic disease appears stable since 2018. Electronically Signed   By: Genevie Ann M.D.   On: 12/05/2017 17:00   Ir Gastrostomy Tube Mod Sed  Result Date: 12/10/2017 INDICATION: 84 year old female with achalasia and inability to eat. She presents for percutaneous gastrostomy tube placement. EXAM: Fluoroscopically guided placement of percutaneous pull-through gastrostomy tube Interventional Radiologist:  Criselda Peaches, MD MEDICATIONS: 2 g Ancef; Antibiotics were administered within 1 hour of the procedure. Additionally, 1 mg glucagon was administered prior to the procedure. ANESTHESIA/SEDATION: Versed 1.5 mg IV; Fentanyl 50 mcg IV Moderate Sedation Time:  14 minutes The patient was continuously monitored during the procedure by the interventional radiology nurse under my direct supervision. CONTRAST:  15 mL Isovue 370 injected into the gastric lumen. FLUOROSCOPY TIME:  Fluoroscopy Time: 2 minutes 48 seconds (4 mGy). COMPLICATIONS: None immediate. PROCEDURE: Informed written consent was obtained from the patient after a thorough discussion of the procedural risks, benefits and alternatives. All questions were addressed. Maximal Sterile Barrier Technique was utilized including caps, mask, sterile gowns, sterile gloves, sterile drape, hand  hygiene and skin antiseptic. A timeout was performed prior to the initiation of the procedure. Maximal barrier sterile technique utilized including caps, mask, sterile gowns, sterile gloves, large sterile drape, hand hygiene, and chlorhexadine skin prep. An angled catheter was advanced over a wire under fluoroscopic guidance through the nose, down the esophagus and into the body of the stomach. The stomach was then insufflated with several 100 ml of air. Fluoroscopy confirmed location of the gastric bubble, as well as inferior displacement of the barium stained colon. Under direct fluoroscopic guidance, a single T-tack was placed, and the anterior gastric wall drawn up against the anterior abdominal wall. Percutaneous access was then obtained into the mid gastric body with an 18 gauge sheath needle. Aspiration of air, and injection of contrast material under fluoroscopy confirmed needle placement. An Amplatz wire was advanced in the gastric body and the access needle exchanged for a 9-French vascular sheath. A snare device was advanced through the vascular sheath and an Amplatz wire advanced through the angled catheter. The Amplatz wire was successfully snared and this was pulled up through the esophagus and out the mouth. A 20-French Alinda Dooms MIC-PEG tube was then connected to the snare and pulled through the mouth, down the esophagus, into the stomach and out to the anterior abdominal wall. Hand injection of contrast material confirmed intragastric location. The T-tack retention suture was then cut. The pull through peg tube was then secured with the external bumper and capped. The patient will be observed for several hours with the newly placed tube on low wall suction to evaluate for any post procedure complication. The patient tolerated the procedure well, there is no immediate complication. IMPRESSION: Successful placement of a 20 French pull through gastrostomy tube. Electronically Signed   By: Jacqulynn Cadet M.D.   On: 12/10/2017 09:29   US Abdomen Limited  Result Date: 12/19/2017 CLINICAL DATA:  Periumbilical nodule. EXAM: ULTRASOUND ABDOMEN LIMITED COMPARISON:  CT scan of November 04, 2017. FINDINGS: Limited sonographic evaluation of the periumbilical region demonstrates moderate size hernia. There is solid density within the hernia which may represent bowel loops, although no peristalsis is observed. The possibility of this representing enlarged lymph node or other neoplasm cannot be excluded. IMPRESSION: Moderate size periumbilical hernia is noted. Solid density is noted within the hernia which may represent bowel loop, but enlarged lymph node or neoplasm cannot be excluded. Clinical correlation is recommended. Electronically Signed   By: Marijo Conception, M.D.   On: 12/19/2017 12:44   Dg Chest Port 1 View  Result Date: 12/23/2017 CLINICAL DATA:  Hypoxia. EXAM: PORTABLE CHEST 1 VIEW COMPARISON:  12/23/2017 FINDINGS: Postoperative changes in the mediastinum. Right PICC line with tip over the low SVC region. No pneumothorax. Cardiac enlargement with diffuse pulmonary vascular congestion. Diffuse interstitial pattern and perihilar alveolar pattern to the lungs suggesting pulmonary edema. Probable small bilateral pleural effusions. Calcification of the aorta. Gastrostomy tube. Degenerative changes in the spine. Old fracture deformity of the right proximal humerus. IMPRESSION: Cardiac enlargement with pulmonary vascular congestion and pulmonary edema. Small bilateral pleural effusions. Electronically Signed   By: Lucienne Capers M.D.   On: 12/23/2017 22:12   Dg Chest Port 1 View  Result Date: 12/23/2017 CLINICAL DATA:  Congestive heart failure EXAM: PORTABLE CHEST 1 VIEW COMPARISON:  Chest radiograph 12/20/2017 FINDINGS: Monitoring leads overlie the patient. Right upper extremity PICC line tip projects over the superior vena cava. Patient status post median sternotomy. Stable cardiac and  mediastinal contours. Small bilateral  layering pleural effusions. Bilateral mid lower lung heterogeneous opacities. IMPRESSION: Cardiomegaly. Persistent interstitial edema with small bilateral pleural effusions. Electronically Signed   By: Lovey Newcomer M.D.   On: 12/23/2017 11:24   Dg Chest Port 1 View  Result Date: 12/20/2017 CLINICAL DATA:  Shortness of breath EXAM: PORTABLE CHEST 1 VIEW COMPARISON:  12/15/2017 FINDINGS: Postoperative changes in the mediastinum. Right PICC line with tip over the low SVC region. No pneumothorax. Cardiac enlargement with pulmonary vascular congestion. Developing interstitial changes in the lungs likely representing edema. No blunting of costophrenic angles. Calcification of the aorta. Degenerative changes in the spine and shoulders. Fracture of the proximal right humerus as seen previously. IMPRESSION: Cardiac enlargement with pulmonary vascular congestion and developing interstitial edema. Electronically Signed   By: Lucienne Capers M.D.   On: 12/20/2017 22:20   Dg Chest Port 1 View  Result Date: 12/15/2017 CLINICAL DATA:  Decreased breath sounds at left lung base,hx htn,dm,cad,cardiomyopathy EXAM: PORTABLE CHEST 1 VIEW COMPARISON:  Chest x-rays dated 12/05/2017, 10/06/2016 and 05/14/2010. FINDINGS: Heart size and mediastinal contours are stable. New RIGHT-sided PICC line in place with tip at the level of the lower SVC/cavoatrial junction. Lungs appear clear. No pleural effusion or pneumothorax seen. Median sternotomy wires appear intact and stable in alignment. No acute or suspicious osseous finding. IMPRESSION: No active disease. No evidence of pneumonia or pulmonary edema. RIGHT-sided PICC line appears well positioned with tip at the level of the lower SVC/cavoatrial junction. Electronically Signed   By: Franki Cabot M.D.   On: 12/15/2017 11:10   Dg Chest Port 1 View  Result Date: 12/05/2017 CLINICAL DATA:  Altered mental status, difficulty with speech EXAM:  PORTABLE CHEST 1 VIEW COMPARISON:  Portable chest x-ray of 11/09/2016 FINDINGS: No active infiltrate or effusion is seen. Mediastinal and hilar contours are unremarkable. Mild cardiomegaly is stable as is the ectatic descending thoracic aorta. Posttraumatic degenerative change of the right humeral neck is again noted. IMPRESSION: 1. No active lung disease. 2. Stable mild cardiomegaly. Electronically Signed   By: Ivar Drape M.D.   On: 12/05/2017 10:00   [place] Ekg Site Rite  Result Date: 12/09/2017 If Site Rite image not attached, placement could not be confirmed due to current cardiac rhythm.   Lab Data:  CBC: Recent Labs  Lab 12/20/17 0400 12/21/17 0608 12/22/17 1219 12/23/17 0501 12/24/17 0428  WBC 8.8 11.7* 15.1* 14.9* 14.4*  HGB 7.7* 8.0* 7.9* 7.3* 9.6*  HCT 25.2* 26.7* 25.9* 23.6* 30.3*  MCV 91.6 92.1 90.9 91.1 89.1  PLT 300 356 337 334 710   Basic Metabolic Panel: Recent Labs  Lab 12/19/17 1103 12/21/17 0608 12/22/17 0524 12/23/17 0501 12/24/17 0428  NA 138 140 135 130* 135  K 4.5 3.7 4.3 4.5 4.5  CL 108 107 101 95* 97*  CO2 24 25 26 27 28   GLUCOSE 172* 58* 187* 264* 128*  BUN 22 24* 30* 34* 40*  CREATININE 1.34* 1.34* 1.52* 1.54* 1.77*  CALCIUM 8.7* 9.1 8.8* 8.9 9.1  PHOS 3.2  --   --   --   --    GFR: Estimated Creatinine Clearance: 19.8 mL/min (A) (by C-G formula based on SCr of 1.77 mg/dL (H)). Liver Function Tests: Recent Labs  Lab 12/19/17 1103 12/19/17 1737 12/20/17 1146  AST  --   --  27  ALT  --   --  21  ALKPHOS  --   --  54  BILITOT  --  0.5 0.5  PROT  --   --  5.3*  ALBUMIN 1.5*  --  1.4*   No results for input(s): LIPASE, AMYLASE in the last 168 hours. Recent Labs  Lab 12/20/17 1146  AMMONIA 11   Coagulation Profile: No results for input(s): INR, PROTIME in the last 168 hours. Cardiac Enzymes: Recent Labs  Lab 12/21/17 0608 12/21/17 1840 12/22/17 0524 12/23/17 2200 12/24/17 0428  TROPONINI 12.17* 10.94* 5.12* 2.13* 3.17*   BNP  (last 3 results) No results for input(s): PROBNP in the last 8760 hours. HbA1C: No results for input(s): HGBA1C in the last 72 hours. CBG: Recent Labs  Lab 12/23/17 0743 12/23/17 1217 12/23/17 1738 12/23/17 2112 12/24/17 0739  GLUCAP 201* 298* 340* 241* 100*   Lipid Profile: No results for input(s): CHOL, HDL, LDLCALC, TRIG, CHOLHDL, LDLDIRECT in the last 72 hours. Thyroid Function Tests: No results for input(s): TSH, T4TOTAL, FREET4, T3FREE, THYROIDAB in the last 72 hours. Anemia Panel: No results for input(s): VITAMINB12, FOLATE, FERRITIN, TIBC, IRON, RETICCTPCT in the last 72 hours. Urine analysis:    Component Value Date/Time   COLORURINE YELLOW 12/23/2017 1324   APPEARANCEUR CLOUDY (A) 12/23/2017 1324   LABSPEC 1.010 12/23/2017 1324   PHURINE 7.0 12/23/2017 1324   GLUCOSEU NEGATIVE 12/23/2017 1324   HGBUR MODERATE (A) 12/23/2017 1324   BILIRUBINUR NEGATIVE 12/23/2017 1324   KETONESUR NEGATIVE 12/23/2017 1324   PROTEINUR 100 (A) 12/23/2017 1324   UROBILINOGEN 0.2 06/30/2011 1214   NITRITE NEGATIVE 12/23/2017 1324   LEUKOCYTESUR LARGE (A) 12/23/2017 1324       M.D. [facility] 12/24/2017, 11:22 AM  Pager: [phone] Between 7am to 7pm - call Pager - [phone]  After 7pm go to [email or web address] - password [email or web address]  Call night coverage person covering after 7pm

## 2017-12-24 NOTE — Progress Notes (Signed)
Progress Note  Patient Name: [name] Date of Encounter: 12/24/2017  Primary Cardiologist: Minus Breeding, MD   Subjective   Problems overnight with worsening tachycardia, vomiting and possible aspiration worsening congestive heart failure by x-ray. She received blood transfusion earlier in the day and hemoglobin is up appropriately to 9.4. Slight bump in troponin from 2.1to 3.1. ECG unchanged from the day before with widespread ST depression and T wave inversion in anterior and inferior leads (these changes are new from admission). Despite diuretics only net -470 mL and creatinine has increased substantially, now 1.77  Inpatient Medications    Scheduled Meds: . sodium chloride   Intravenous Once  . alteplase  2 mg Intracatheter Once  . atorvastatin  40 mg Per Tube q1800  . Chlorhexidine Gluconate Cloth  6 each Topical Q0600  . cholecalciferol  400 Units Per Tube Daily  . clopidogrel  75 mg Per Tube Daily  . ferrous sulfate  300 mg Per Tube Daily  . free water  250 mL Per Tube Q8H  . furosemide  20 mg Intravenous Q12H  . insulin aspart  0-15 Units Subcutaneous QID  . insulin aspart  3 Units Subcutaneous QID  . insulin glargine  20 Units Subcutaneous QHS  . levalbuterol  0.63 mg Nebulization Q4H   And  . ipratropium  0.5 mg Nebulization Q4H  . methylPREDNISolone (SOLU-MEDROL) injection  40 mg Intravenous Q12H  . metoprolol tartrate  75 mg Oral BID  . mirtazapine  7.5 mg Per Tube QHS  . multivitamin  15 mL Per Tube Daily  . mupirocin ointment  1 application Nasal BID  . pantoprazole sodium  40 mg Per Tube BID  . potassium chloride  20 mEq Per Tube BID  . sennosides  5 mL Per Tube QHS   Continuous Infusions: . feeding supplement (JEVITY 1.2 CAL) 1,000 mL (12/24/17 0842)  . nitroGLYCERIN    . piperacillin-tazobactam (ZOSYN)  IV 3.375 g (12/24/17 4496)  . vancomycin 500 mg (12/24/17 0148)   PRN Meds: acetaminophen, fentaNYL, hydrALAZINE, levalbuterol, loperamide  HCl, morphine injection, nitroGLYCERIN, ondansetron (ZOFRAN) IV, sodium chloride flush   Vital Signs    Vitals:   12/24/17 0000 12/24/17 0020 12/24/17 0100 12/24/17 0743  BP:   138/73 119/81  Pulse:   (!) 109   Resp: 18 17 19  (!) 28  Temp:   (!) 97.5 F (36.4 C) 98.3 F (36.8 C)  TempSrc:   Axillary Axillary  SpO2: 100% 96% 95% 100%  Weight:      Height:        Intake/Output Summary (Last 24 hours) at 12/24/2017 0845 Last data filed at 12/24/2017 0649 Gross per 24 hour  Intake 575.91 ml  Output 1050 ml  Net -474.09 ml   Filed Weights   12/17/17 2023 12/20/17 0500 12/20/17 2350  Weight: 54 kg 54.4 kg 57.6 kg    Telemetry    Sinus tachycardia- Personally Reviewed  ECG    Sinus rhythm, downsloping ST segment depression and T wave inversion leads I, 2, aVL, V3-V6 - Personally Reviewed  Physical Exam  Eyes closed, does not acknowledge my presence, but does move in response to voice GEN: No acute distress.   Neck: No JVD Cardiac: RRR, tachycardia, no murmurs, rubs, or gallops.  Respiratory:  Few scattered bilateral rales. GI: Soft, nontender, non-distended  MS:  2+ sacral edema; No deformity. Neuro:  Nonfocal  Psych: Normal affect   Labs    Chemistry Recent Labs  Lab 12/19/17 1103 12/19/17  1737 12/20/17 1146  12/22/17 0524 12/23/17 0501 12/24/17 0428  NA 138  --   --    < > 135 130* 135  K 4.5  --   --    < > 4.3 4.5 4.5  CL 108  --   --    < > 101 95* 97*  CO2 24  --   --    < > 26 27 28   GLUCOSE 172*  --   --    < > 187* 264* 128*  BUN 22  --   --    < > 30* 34* 40*  CREATININE 1.34*  --   --    < > 1.52* 1.54* 1.77*  CALCIUM 8.7*  --   --    < > 8.8* 8.9 9.1  PROT  --   --  5.3*  --   --   --   --   ALBUMIN 1.5*  --  1.4*  --   --   --   --   AST  --   --  27  --   --   --   --   ALT  --   --  21  --   --   --   --   ALKPHOS  --   --  54  --   --   --   --   BILITOT  --  0.5 0.5  --   --   --   --   GFRNONAA 34*  --   --    < > 29* 29* 24*    GFRAA 40*  --   --    < > 34* 34* 28*  ANIONGAP 6  --   --    < > 8 8 10    < > = values in this interval not displayed.     Hematology Recent Labs  Lab 12/22/17 1219 12/23/17 0501 12/24/17 0428  WBC 15.1* 14.9* 14.4*  RBC 2.85* 2.59* 3.40*  HGB 7.9* 7.3* 9.6*  HCT 25.9* 23.6* 30.3*  MCV 90.9 91.1 89.1  MCH 27.7 28.2 28.2  MCHC 30.5 30.9 31.7  RDW 18.4* 18.2* 16.8*  PLT 337 334 302    Cardiac Enzymes Recent Labs  Lab 12/21/17 1840 12/22/17 0524 12/23/17 2200 12/24/17 0428  TROPONINI 10.94* 5.12* 2.13* 3.17*   No results for input(s): TROPIPOC in the last 168 hours.   BNP Recent Labs  Lab 12/20/17 1450 12/23/17 2200  BNP >4,500.0* >4,500.0*     DDimer No results for input(s): DDIMER in the last 168 hours.   Radiology    Dg Chest Port 1 View  Result Date: 12/23/2017 CLINICAL DATA:  Hypoxia. EXAM: PORTABLE CHEST 1 VIEW COMPARISON:  12/23/2017 FINDINGS: Postoperative changes in the mediastinum. Right PICC line with tip over the low SVC region. No pneumothorax. Cardiac enlargement with diffuse pulmonary vascular congestion. Diffuse interstitial pattern and perihilar alveolar pattern to the lungs suggesting pulmonary edema. Probable small bilateral pleural effusions. Calcification of the aorta. Gastrostomy tube. Degenerative changes in the spine. Old fracture deformity of the right proximal humerus. IMPRESSION: Cardiac enlargement with pulmonary vascular congestion and pulmonary edema. Small bilateral pleural effusions. Electronically Signed   By: Lucienne Capers M.D.   On: 12/23/2017 22:12   Dg Chest Port 1 View  Result Date: 12/23/2017 CLINICAL DATA:  Congestive heart failure EXAM: PORTABLE CHEST 1 VIEW COMPARISON:  Chest radiograph 12/20/2017 FINDINGS: Monitoring leads overlie the patient. Right upper extremity PICC  line tip projects over the superior vena cava. Patient status post median sternotomy. Stable cardiac and mediastinal contours. Small bilateral layering  pleural effusions. Bilateral mid lower lung heterogeneous opacities. IMPRESSION: Cardiomegaly. Persistent interstitial edema with small bilateral pleural effusions. Electronically Signed   By: Lovey Newcomer M.D.   On: 12/23/2017 11:24    Cardiac Studies   Echo11/21/2019 Left ventricle: The cavity size was normal. Wall thickness was increased in a pattern of mild LVH. The estimated ejection fraction was 30%. Diffuse hypokinesis. Features are consistent with a pseudonormal left ventricular filling pattern, with concomitant abnormal relaxation and increased filling pressure (grade 2 diastolic dysfunction). - Aortic valve: There was no stenosis. - Mitral valve: There was mild to moderate regurgitation. - Left atrium: The atrium was mildly dilated. - Right ventricle: The cavity size was normal. Systolic function was mildly reduced. - Pulmonary arteries: No complete TR doppler jet so unable to estimate PA systolic pressure. - Inferior vena cava: The vessel was normal in size. The respirophasic diameter changes were in the normal range (>= 50%), consistent with normal central venous pressure.   Patient Profile     84 y.o. female with CAD s/p CABG, recent ischemic stroke, HTN, DM, HLP, recent failure to thrive due to swallowing problems s/p botox injection for achalasia, developed acute pulmonary edema and NSTEMI on 12/20/2017.  Worsening respiratory status following vomiting/aspiration and blood transfusion on 12/23/2017.  Assessment & Plan    1. NSTEMI:may have been secondary to volume overload/acute HF rather than the inciting event (no localizing ECG changes, no regional wall motion abnormalities on echo).  Of course, she also has underlying CAD and its possible that she has worsening coronary insufficiency.  On clopidogrel and on statin.  Intravenous heparin stopped after 48 hours due to concern about possible bleeding. 2. Acute combined systolic and diastolic HF: She  remains volume overloaded and this worsened following blood transfusion.    Will add intravenous nitroglycerin for unloading.  Renal parameters are deteriorating. Target weight probably 116 lb or less.  Not sure that today's weight is accurate.  3. Acute on CKD 3: Monitor daily.  Off R AAS inhibitors for now.  Consider ARB if creat returns to baseline around 1.2 -1.3. 4. Recent CVA:on clopidogrel. 5. Anemia:  Improved following transfusion.  She is elderly, frail and has numerous health problems, some of which are worsening.  She is not a good candidate for invasive coronary angiography and revascularization and will continue with medical management as appropriate.  Her prognosis is very guarded.  Her son, Dr. Erick Alley, MD is at the bedside and is aware of the seriousness of her illness, but is still hopeful that we will be able to turn around if we achieve better volume status.  He is not ready for palliative care approach.     For questions or updates, please contact [place] Please consult [email or web address] for contact info under        Signed, Sanda Klein, MD  12/24/2017, 8:45 AM

## 2017-12-24 NOTE — Significant Event (Signed)
Rapid Response Event Note  Overview: Agitation  Initial Focused Assessment: Called by RN about patient being acutely agitated. Per RN, patient vomited, is now requiring oxygen, and is agitated.  RN had already administered Ativan 0.25 mg IV and patient was still very agitated.  Upon arrival, patient appeared in the distress, was agitated, and moaning. Patient would respond to verbal stimuli but did not follow commands.   + Use of accessory muscles, increased work of breathing, lung sounds were diminished throughout. Now on 6L [place] and saturations maintaining > 95%. Patient appears like she is in pain and uncomfortable.  Skin cool to touch, + pulses, HR in the 120s, SBP in the 160s.   Interventions: --Morphine 2 mg IV x 1 now -- STAT CXR -- STAT ABG  -- STAT EKG  Plan of Care: -- I paged the [facility] NP at 2125 and [facility] NP came to the bedside.  -- [name] arrived at bedside. Vanc/Zosyn ordered, Troponin was 2.13 (down from 5.12), EKG - no acute ST segment changes, BNP > 4500 - Lasix 40 mg IV, an additional dose Morphine 2 mg IV given -- I spoke with the RN at 0000, WOB improved and patient was more calmer now.  Event Summary:    at    Call Time 2125 Arrival Time 2127 End Time 0000  [place], [place]

## 2017-12-25 ENCOUNTER — Encounter (HOSPITAL_COMMUNITY): Payer: Self-pay

## 2017-12-25 ENCOUNTER — Inpatient Hospital Stay (HOSPITAL_COMMUNITY): Payer: Medicare Other

## 2017-12-25 LAB — CBC
HEMATOCRIT: 28.6 % — AB (ref 36.0–46.0)
HEMOGLOBIN: 9 g/dL — AB (ref 12.0–15.0)
MCH: 28.5 pg (ref 26.0–34.0)
MCHC: 31.5 g/dL (ref 30.0–36.0)
MCV: 90.5 fL (ref 80.0–100.0)
Platelets: 294 10*3/uL (ref 150–400)
RBC: 3.16 MIL/uL — ABNORMAL LOW (ref 3.87–5.11)
RDW: 17 % — ABNORMAL HIGH (ref 11.5–15.5)
WBC: 12.5 10*3/uL — AB (ref 4.0–10.5)
nRBC: 0 % (ref 0.0–0.2)

## 2017-12-25 LAB — TYPE AND SCREEN
ABO/RH(D): O POS
Antibody Screen: NEGATIVE
UNIT DIVISION: 0
Unit division: 0

## 2017-12-25 LAB — BPAM RBC
Blood Product Expiration Date: 201912202359
Blood Product Expiration Date: 201912202359
ISSUE DATE / TIME: 201911231215
ISSUE DATE / TIME: 201911231558
UNIT TYPE AND RH: 5100
Unit Type and Rh: 5100

## 2017-12-25 LAB — BLOOD GAS, ARTERIAL
Acid-Base Excess: 3.2 mmol/L — ABNORMAL HIGH (ref 0.0–2.0)
Bicarbonate: 27.2 mmol/L (ref 20.0–28.0)
Drawn by: 275531
O2 Content: 3 L/min
O2 Saturation: 93.4 %
PO2 ART: 67.3 mmHg — AB (ref 83.0–108.0)
Patient temperature: 98.6
pCO2 arterial: 42.2 mmHg (ref 32.0–48.0)
pH, Arterial: 7.426 (ref 7.350–7.450)

## 2017-12-25 LAB — BASIC METABOLIC PANEL
ANION GAP: 8 (ref 5–15)
BUN: 56 mg/dL — AB (ref 8–23)
CO2: 29 mmol/L (ref 22–32)
Calcium: 9.5 mg/dL (ref 8.9–10.3)
Chloride: 99 mmol/L (ref 98–111)
Creatinine, Ser: 2.28 mg/dL — ABNORMAL HIGH (ref 0.44–1.00)
GFR, EST AFRICAN AMERICAN: 21 mL/min — AB (ref >60)
GFR, EST NON AFRICAN AMERICAN: 18 mL/min — AB (ref >60)
Glucose, Bld: 216 mg/dL — ABNORMAL HIGH (ref 70–99)
POTASSIUM: 5 mmol/L (ref 3.5–5.1)
SODIUM: 136 mmol/L (ref 135–145)

## 2017-12-25 LAB — GLUCOSE, CAPILLARY
Glucose-Capillary: 181 mg/dL — ABNORMAL HIGH (ref 70–99)
Glucose-Capillary: 195 mg/dL — ABNORMAL HIGH (ref 70–99)
Glucose-Capillary: 204 mg/dL — ABNORMAL HIGH (ref 70–99)
Glucose-Capillary: 222 mg/dL — ABNORMAL HIGH (ref 70–99)
Glucose-Capillary: 228 mg/dL — ABNORMAL HIGH (ref 70–99)
Glucose-Capillary: 271 mg/dL — ABNORMAL HIGH (ref 70–99)

## 2017-12-25 LAB — MAGNESIUM: Magnesium: 2 mg/dL (ref 1.7–2.4)

## 2017-12-25 MED ORDER — FUROSEMIDE 10 MG/ML IJ SOLN
INTRAMUSCULAR | Status: AC
  Filled 2017-12-25: qty 4

## 2017-12-25 MED ORDER — METOCLOPRAMIDE HCL 5 MG/ML IJ SOLN
5.0000 mg | Freq: Four times a day (QID) | INTRAMUSCULAR | Status: DC
  Administered 2017-12-25 – 2018-01-10 (×63): 5 mg via INTRAVENOUS
  Filled 2017-12-25 (×63): qty 2

## 2017-12-25 MED ORDER — FLUMAZENIL 1 MG/10ML IV SOLN
INTRAVENOUS | Status: AC
  Filled 2017-12-25: qty 10

## 2017-12-25 MED ORDER — VANCOMYCIN HCL IN DEXTROSE 750-5 MG/150ML-% IV SOLN
750.0000 mg | INTRAVENOUS | Status: DC
  Administered 2017-12-26: 750 mg via INTRAVENOUS
  Filled 2017-12-25: qty 150

## 2017-12-25 MED ORDER — NALOXONE HCL 0.4 MG/ML IJ SOLN
INTRAMUSCULAR | Status: AC
  Administered 2017-12-25: 0.2 mg
  Filled 2017-12-25: qty 1

## 2017-12-25 MED ORDER — FREE WATER
135.0000 mL | Freq: Three times a day (TID) | Status: DC
  Administered 2017-12-25 – 2017-12-31 (×19): 135 mL

## 2017-12-25 MED ORDER — LORAZEPAM 2 MG/ML IJ SOLN
INTRAMUSCULAR | Status: AC
  Administered 2017-12-25: 0.5 mg via INTRAVENOUS
  Filled 2017-12-25: qty 1

## 2017-12-25 MED ORDER — FUROSEMIDE 10 MG/ML IJ SOLN
40.0000 mg | Freq: Once | INTRAMUSCULAR | Status: AC
  Administered 2017-12-25: 40 mg via INTRAVENOUS

## 2017-12-25 MED ORDER — FUROSEMIDE 10 MG/ML IJ SOLN: 60.0000 mg | Freq: Two times a day (BID) | INTRAMUSCULAR | Status: DC

## 2017-12-25 MED ORDER — JEVITY 1.2 CAL PO LIQD
1000.0000 mL | ORAL | Status: DC
  Administered 2017-12-27 – 2017-12-30 (×4): 1000 mL
  Filled 2017-12-25 (×13): qty 1000

## 2017-12-25 MED ORDER — LORAZEPAM 2 MG/ML IJ SOLN
0.5000 mg | Freq: Once | INTRAMUSCULAR | Status: AC
  Administered 2017-12-25: 0.5 mg via INTRAVENOUS

## 2017-12-25 MED ORDER — PIPERACILLIN-TAZOBACTAM IN DEX 2-0.25 GM/50ML IV SOLN
2.2500 g | Freq: Three times a day (TID) | INTRAVENOUS | Status: DC
  Administered 2017-12-25 – 2017-12-28 (×10): 2.25 g via INTRAVENOUS
  Filled 2017-12-25 (×13): qty 50

## 2017-12-25 MED ORDER — JEVITY 1.2 CAL PO LIQD
1000.0000 mL | ORAL | Status: DC
  Administered 2017-12-25: 1000 mL
  Filled 2017-12-25: qty 1000

## 2017-12-25 NOTE — Progress Notes (Signed)
Pharmacy Antibiotic Note  [name] is a 84 y.o. female admitted on 12/05/2017 with AMS and weight loss.  Pt was started on antibiotics for UTI on admission which resolved.  Antibiotics restarted 11/24 to cover for hospital associate / aspiration PNA.    SCr has worsened from 1.77 to 2.28, UOP also decreasing.    Plan: Change Vancomycin750 mg IV q48h - next dose due 11/26 at 2200 Change Zosyn 2.25 gm IV q8h Trend WBC, temp, renal function  F/U infectious work-up - E. Faecalis UTI Drug levels as indicated  Height: 5\' 3"  (160 cm) Weight: 124 lb (56.2 kg) IBW/kg (Calculated) : 52.4  Temp (24hrs), Avg:98.7 F (37.1 C), Min:98.3 F (36.8 C), Max:99.3 F (37.4 C)  Recent Labs  Lab 12/21/17 0608 12/22/17 0524 12/22/17 1219 12/23/17 0501 12/24/17 0428 12/25/17 0435 12/25/17 1331  WBC 11.7*  --  15.1* 14.9* 14.4* 12.5*  --   CREATININE 1.34* 1.52*  --  1.54* 1.77*  --  2.28*    Estimated Creatinine Clearance: 14.1 mL/min (A) (by C-G formula based on SCr of 2.28 mg/dL (H)).    Allergies  Allergen Reactions  . Contrast Media [Iodinated Diagnostic Agents]     Sensitivity, kidney issues    Antimicrobials this admission: Vanc 11/24 >> Zosyn 11/24 >> CTX 11/5 >> 11/8 Amp 11/8 >> 11/12  Dose adjustments this admission:  Microbiology results: 11/23 Ucx >>100k E faecalis 11/22 BCx >> ngtd 11/5 UCx (catherized) >> E faecalis (pan-S) 11/6 MRSA PCR >> positive  Thank you for allowing pharmacy to be a part of this patient's care.  [employer], Pharm.D., BCPS Clinical Pharmacist Pager: [phone] Clinical phone for 12/25/2017 from 8:30-4:00 is [phone].  **Pharmacist phone directory can now be found on [email or web address] (PW [email or web address]).  Listed under [place].  12/25/2017 3:19 PM

## 2017-12-25 NOTE — Progress Notes (Signed)
Pt residual checked, noted to have over 11ml residual pulled; tube feed stopped, will re-assess in 1 hour. Pt's son at bedside, states concern with increasing pt's tube feed as he believes she has gastroparesis. On call provider notified, ok to resume at 47ml/hr once no more residual.

## 2017-12-25 NOTE — Progress Notes (Signed)
Rechecked pt's G-tube for residual; >28ml noted. Will continue to hold and will recheck in 1 hour. Son at bedside, verbalizes agreement with plan.

## 2017-12-25 NOTE — Significant Event (Signed)
Rapid Response Event Note  Overview:  Called to assist with patient with resp distress Time Called: 0092 Arrival Time: 1143 Event Type: Respiratory  Initial Focused Assessment:  On arrival patient supine in bed - warm and dry - responsive to painful stimuli - resps shallow and irregular with periods of apnea O2 sats 89%.  ST on monitor. BP 140/88 CBG 195.  JVD noted.  Bil BS with crackles.  Abd soft - little urine output - yellow.  ABG being drawn.  RN Melissa at bedside -states patient was alert earlier - restless and agitated.  Was treated with 2mg  Morphine 1.5 hours ago - remained agitated and restless - was treated with Ativan 0.5 mg 45 mins ago.    Interventions: Responds to pain - but very sleepy with some apnea - Dr. Tana Coast to bedside - treated with 0.2mg  IV Narcan - patient became more awake - agitated and crying initially - then resting somewhat.  Resps remain regular - NAD.  BP stable - O2 sats 100%.  ABG normal.  PCXR without changes.  Dr. Tana Coast speaking with son - update given.    Plan of Care (if not transferred):  Handoff to [facility] RN - to monitor in SDU status - to call as needed.    Event Summary: Name of Physician Notified: Dr. Tana Coast at (pta RRT)    at    Outcome: Stayed in room and stabalized  Event End Time: 1230  Quin Hoop

## 2017-12-25 NOTE — Progress Notes (Addendum)
[facility]                                                                              Patient Demographics  [name], is a 84 y.o. female, DOB - Nov 28, 1927, TIW:[id number]  Admit date - 12/05/2017   Admitting Physician Janora Norlander, MD  Outpatient Primary MD for the patient is Deland Pretty, MD  Outpatient specialists:   LOS - 16  days   Medical records reviewed and are as summarized below:    Chief Complaint  Patient presents with  . Altered Mental Status       Brief summary   [name] is a 84 y.o. femalewith medical history significant forDM2, HTN, HLD, CAD s/p CABG, h/o CVA, CKD3. She presented secondary to dysarthria. MRI significant for no stroke. Also with failure to thrive with PEG tube placed. Working up confusion.    Assessment & Plan    Principal Problem: Acute respiratory failure with hypoxia secondary to pulmonary edema, acute combined systolic and diastolic CHF - On 25/05, patient had rapid response with acute pulmonary edema, likely due to volume overload, BNP elevated, placed on IV Lasix.  Patient was found to be 27 L positive, had been receiving tube feeds, free water and IV fluids during hospitalization. -Repeat echo showed EF of 30% with grade 2 diastolic dysfunction, troponins trended up to 12.1 -Rapid response again today, called to the bedside, patient restless and agitated, shallow breathing.  On exam, diffuse wheezing, not following commands.  Patient had received low-dose morphine with no significant effect, subsequently low-dose Ativan 0.5 mg.  Narcan 0.2 mg given, patient became alert and awake, restless.  Ordered stat ABG which showed pH 7.4, PCO2 42, stat chest x-ray showed pulmonary edema.  Gave Lasix 40 mg IV x1.  Patient had received IV Solu-Medrol and DuoNeb's today.  NSTEMI possibly due to volume overload/acute HF -Troponins elevated at 8.87, trended up to 12.1, patient was placed on IV heparin drip and  cardiology was consulted. -Recent 2D echo on 11/09/2017 had shown EF of 35 to 50% with grade 1 diastolic dysfunction.  Repeat echo showed EF of 30% with grade 2 diastolic dysfunction, diffuse hypokinesis -Heparin drip off after 48 hours, continue nitro drip per cardiology recommendations, continue Plavix and statin.    Active problems Acute metabolic encephalopathy -Patient had presented with dysarthria, confusion.  After discussion with the patient's son (physician), he states that patient does not have dementia -MRI of the brain showed chronic advanced atherosclerosis disease, chronic lacunar infarcts however no new infarct. -UA was positive 11/5, had shown more than 100,000 colonies of Enterococcus faecalis.  Repeat urine culture on 11/13, showed no growth -LFTs, ammonia level normal.  Neurology evaluation done on 11/20, seen by Dr. Cheral Marker, likely patient has vascular dementia due to chronic infarcts and chronic atherosclerotic disease. -Please see above, better after receiving Lasix  Aspiration PNA -Continue IV vancomycin and Zosyn. -Aspiration precautions.  Will hold tube feeds for now  Anemia: Acute on chronic -Baseline appears to be 7.5-8.  FOBT negative, not far from her baseline of 7.5.  May have been hemodilution. - Epo level is slightly  above normal, received 2 units packed RBC transfusion for hemoglobin of 7.3 on 11/23 given NSTEMI and demand ischemia.  Heparin drip was discontinued. -H&H stable    Diabetes mellitus (HCC) type II, uncontrolled -Continue sliding scale insulin to every 4 hours, continue Lantus  Failure to thrive, status post PEG tube, dysphagia -Off TPN -Patient had high residuals yesterday and was not able to tolerate.  Start on low-dose Reglan 5 mg every 6 hours IV, restart tube feeds at low dose 20 cc an hour and reassess.   Pressure injury Stage II partial-thickness with shallow open ulcer, red-pink wound bed with slough, medial sacrum, present on  admission Wound care per nursing  Subcutaneous nodule; non-incarcerated umbilical hernia probably lymph node Seen by surgery, and recommended no surgical treatment at this time  Code Status: Full CODE STATUS DVT Prophylaxis:  SCD's Family Communication: Discussed in detail with the patient, all imaging results, lab results explained to the patient's son, Dr. Mayer Masker twice a day   Disposition Plan:  Critical care time Spent in minutes 45 mins   Procedures:  MRI brain 2D echo  Consultants:   Palliative medicine Neurology Cardiology  Antimicrobials:      Medications  Scheduled Meds: . sodium chloride   Intravenous Once  . alteplase  2 mg Intracatheter Once  . atorvastatin  40 mg Per Tube q1800  . Chlorhexidine Gluconate Cloth  6 each Topical Q0600  . cholecalciferol  400 Units Per Tube Daily  . clopidogrel  75 mg Per Tube Daily  . ferrous sulfate  300 mg Per Tube Daily  . flumazenil      . free water  135 mL Per Tube Q8H  . furosemide      . furosemide  20 mg Intravenous Q12H  . insulin aspart  0-15 Units Subcutaneous Q4H  . insulin glargine  20 Units Subcutaneous QHS  . levalbuterol  0.63 mg Nebulization TID   And  . ipratropium  0.5 mg Nebulization TID  . methylPREDNISolone (SOLU-MEDROL) injection  40 mg Intravenous Q12H  . metoCLOPramide (REGLAN) injection  5 mg Intravenous Q6H  . metoprolol tartrate  75 mg Oral BID  . mirtazapine  7.5 mg Per Tube QHS  . multivitamin  15 mL Per Tube Daily  . mupirocin ointment  1 application Nasal BID  . pantoprazole sodium  40 mg Per Tube BID  . potassium chloride  20 mEq Per Tube BID  . sennosides  5 mL Per Tube QHS   Continuous Infusions: . feeding supplement (JEVITY 1.2 CAL) Stopped (12/25/17 1100)  . nitroGLYCERIN 5 mcg/min (12/25/17 0800)  . piperacillin-tazobactam (ZOSYN)  IV 12.5 mL/hr at 12/25/17 0800  . vancomycin Stopped (12/25/17 0056)   PRN Meds:.acetaminophen, fentaNYL, hydrALAZINE, levalbuterol, loperamide  HCl, morphine injection, nitroGLYCERIN, ondansetron (ZOFRAN) IV, sodium chloride flush   Antibiotics   Anti-infectives (From admission, onward)   Start     Dose/Rate Route Frequency Ordered Stop   12/24/17 0030  vancomycin (VANCOCIN) 500 mg in sodium chloride 0.9 % 100 mL IVPB     500 mg 100 mL/hr over 60 Minutes Intravenous Every 24 hours 12/24/17 0017     12/24/17 0030  piperacillin-tazobactam (ZOSYN) IVPB 3.375 g     3.375 g 12.5 mL/hr over 240 Minutes Intravenous Every 8 hours 12/24/17 0017     12/10/17 0919  ceFAZolin (ANCEF) IVPB 1 g/50 mL premix  Status:  Discontinued     over 30 Minutes Intravenous Continuous PRN 12/10/17 0919 12/17/17 1336   12/10/17  7591  ceFAZolin (ANCEF) 2-4 GM/100ML-% IVPB    Note to Pharmacy:  Margaretmary Dys   : cabinet override      12/10/17 0859 12/10/17 2114   12/08/17 2200  ampicillin (OMNIPEN) 1 g in sodium chloride 0.9 % 100 mL IVPB  Status:  Discontinued     1 g 300 mL/hr over 20 Minutes Intravenous Every 8 hours 12/08/17 1535 12/12/17 1837   12/07/17 0600  ceFAZolin (ANCEF) IVPB 2g/100 mL premix     2 g 200 mL/hr over 30 Minutes Intravenous To Short Stay 12/06/17 1219 12/08/17 0600   12/06/17 1200  cefTRIAXone (ROCEPHIN) 1 g in sodium chloride 0.9 % 100 mL IVPB  Status:  Discontinued     1 g 200 mL/hr over 30 Minutes Intravenous Every 24 hours 12/05/17 1947 12/08/17 1535   12/05/17 1445  cefTRIAXone (ROCEPHIN) 1 g in sodium chloride 0.9 % 100 mL IVPB     1 g 200 mL/hr over 30 Minutes Intravenous  Once 12/05/17 1441 12/05/17 1622        Subjective:   Lezlee Gills was seen and examined today.  Patient seen twice today, during rapid response, agitated and restless with shallow breathing, hypoxic.  Somewhat better after Lasix.    Objective:   Vitals:   12/25/17 0900 12/25/17 1000 12/25/17 1100 12/25/17 1200  BP: 105/65 (!) 159/85 (!) 141/89 140/88  Pulse:      Resp: (!) 21 (!) 23 (!) 27   Temp:      TempSrc:      SpO2: 98% 96% (!) 89%  91%  Weight:      Height:        Intake/Output Summary (Last 24 hours) at 12/25/2017 1243 Last data filed at 12/25/2017 1200 Gross per 24 hour  Intake 1894.27 ml  Output 800 ml  Net 1094.27 ml     Wt Readings from Last 3 Encounters:  12/25/17 56.2 kg  11/24/17 52.9 kg  10/09/17 59.6 kg   Physical Exam  General: Awake, restless and agitated  Eyes:   HEENT:    Cardiovascular: S1 S2 auscultated, tachycardia, No pedal edema b/l  Respiratory: Diffuse bilateral wheezing  Gastrointestinal: Soft, nontender, nondistended, + bowel sounds  Ext: no pedal edema bilaterally  Neuro: difficult to assess  Musculoskeletal: No digital cyanosis, clubbing  Skin: Stage II sacral ulcer  Psych: restless, agitated      Data Reviewed:  I have personally reviewed following labs and imaging studies  Micro Results Recent Results (from the past 240 hour(s))  Culture, blood (routine x 2)     Status: None (Preliminary result)   Collection Time: 12/22/17 12:31 PM  Result Value Ref Range Status   Specimen Description BLOOD LEFT ANTECUBITAL  Final   Special Requests   Final    BOTTLES DRAWN AEROBIC AND ANAEROBIC Blood Culture adequate volume   Culture   Final    NO GROWTH 3 DAYS Performed at [facility], [address] [place], [place] [address]    Report Status PENDING  Incomplete  Culture, blood (routine x 2)     Status: None (Preliminary result)   Collection Time: 12/22/17 12:31 PM  Result Value Ref Range Status   Specimen Description BLOOD BLOOD LEFT HAND  Final   Special Requests   Final    BOTTLES DRAWN AEROBIC AND ANAEROBIC Blood Culture results may not be optimal due to an inadequate volume of blood received in culture bottles   Culture   Final  NO GROWTH 3 DAYS Performed at [facility], [place] [address] [place], [place] [address]    Report Status PENDING  Incomplete  Urine Culture     Status: Abnormal (Preliminary result)   Collection Time: 12/23/17   1:24 PM  Result Value Ref Range Status   Specimen Description URINE, RANDOM  Final   Special Requests NONE  Final   Culture (A)  Final    >=100,000 COLONIES/mL ENTEROCOCCUS FAECALIS SUSCEPTIBILITIES TO FOLLOW Performed at [facility], [place] [address] [place], [place] [address]    Report Status PENDING  Incomplete    Radiology Reports Ct Head Wo Contrast  Result Date: 12/05/2017 CLINICAL DATA:  Altered level of consciousness. EXAM: CT HEAD WITHOUT CONTRAST TECHNIQUE: Contiguous axial images were obtained from the base of the skull through the vertex without intravenous contrast. COMPARISON:  CT scan of September 20, 2016. FINDINGS: Brain: Mild chronic ischemic white matter disease is noted. No mass effect or midline shift is noted. Ventricular size is within normal limits. There is no evidence of mass lesion, hemorrhage or acute infarction. Vascular: No hyperdense vessel or unexpected calcification. Skull: Normal. Negative for fracture or focal lesion. Sinuses/Orbits: No acute finding. Other: None. IMPRESSION: Mild chronic ischemic white matter disease. No acute intracranial abnormality seen. Electronically Signed   By: Marijo Conception, M.D.   On: 12/05/2017 12:03   Mr Brain Wo Contrast (neuro Protocol)  Result Date: 12/05/2017 CLINICAL DATA:  84 year old female with altered mental status. EXAM: MRI HEAD WITHOUT CONTRAST TECHNIQUE: Multiplanar, multiecho pulse sequences of the brain and surrounding structures were obtained without intravenous contrast. COMPARISON:  Head CT without contrast 1140 hours today. Brain MRI 09/20/2016. FINDINGS: Brain: No restricted diffusion to suggest acute infarction. No midline shift, mass effect, evidence of mass lesion, ventriculomegaly, extra-axial collection or acute intracranial hemorrhage. Cervicomedullary junction and pituitary are within normal limits. Chronic lacunar infarcts in the brainstem and bilateral deep gray matter nuclei. Chronic confluent  bilateral cerebral white matter T2 and FLAIR hyperintensity. Chronic small area of cortical encephalomalacia at the right motor strip. Chronic microhemorrhage in the lateral right thalamus. No new signal abnormality.  Stable cerebral volume since 2018. Vascular: Major intracranial vascular flow voids are stable since 2018. Skull and upper cervical spine: Negative visible cervical spine. Visualized bone marrow signal is within normal limits. Sinuses/Orbits: Stable and negative. Other: Mastoids remain clear. Visible internal auditory structures appear normal. Scalp and face soft tissues appear negative. IMPRESSION: 1.  No acute intracranial abnormality. 2. Advanced chronic ischemic disease appears stable since 2018. Electronically Signed   By: Genevie Ann M.D.   On: 12/05/2017 17:00   Ir Gastrostomy Tube Mod Sed  Result Date: 12/10/2017 INDICATION: 84 year old female with achalasia and inability to eat. She presents for percutaneous gastrostomy tube placement. EXAM: Fluoroscopically guided placement of percutaneous pull-through gastrostomy tube Interventional Radiologist:  Criselda Peaches, MD MEDICATIONS: 2 g Ancef; Antibiotics were administered within 1 hour of the procedure. Additionally, 1 mg glucagon was administered prior to the procedure. ANESTHESIA/SEDATION: Versed 1.5 mg IV; Fentanyl 50 mcg IV Moderate Sedation Time:  14 minutes The patient was continuously monitored during the procedure by the interventional radiology nurse under my direct supervision. CONTRAST:  15 mL Isovue 370 injected into the gastric lumen. FLUOROSCOPY TIME:  Fluoroscopy Time: 2 minutes 48 seconds (4 mGy). COMPLICATIONS: None immediate. PROCEDURE: Informed written consent was obtained from the patient after a thorough discussion of the procedural risks, benefits and alternatives. All questions were addressed.  Maximal Sterile Barrier Technique was utilized including caps, mask, sterile gowns, sterile gloves, sterile drape, hand  hygiene and skin antiseptic. A timeout was performed prior to the initiation of the procedure. Maximal barrier sterile technique utilized including caps, mask, sterile gowns, sterile gloves, large sterile drape, hand hygiene, and chlorhexadine skin prep. An angled catheter was advanced over a wire under fluoroscopic guidance through the nose, down the esophagus and into the body of the stomach. The stomach was then insufflated with several 100 ml of air. Fluoroscopy confirmed location of the gastric bubble, as well as inferior displacement of the barium stained colon. Under direct fluoroscopic guidance, a single T-tack was placed, and the anterior gastric wall drawn up against the anterior abdominal wall. Percutaneous access was then obtained into the mid gastric body with an 18 gauge sheath needle. Aspiration of air, and injection of contrast material under fluoroscopy confirmed needle placement. An Amplatz wire was advanced in the gastric body and the access needle exchanged for a 9-French vascular sheath. A snare device was advanced through the vascular sheath and an Amplatz wire advanced through the angled catheter. The Amplatz wire was successfully snared and this was pulled up through the esophagus and out the mouth. A 20-French Alinda Dooms MIC-PEG tube was then connected to the snare and pulled through the mouth, down the esophagus, into the stomach and out to the anterior abdominal wall. Hand injection of contrast material confirmed intragastric location. The T-tack retention suture was then cut. The pull through peg tube was then secured with the external bumper and capped. The patient will be observed for several hours with the newly placed tube on low wall suction to evaluate for any post procedure complication. The patient tolerated the procedure well, there is no immediate complication. IMPRESSION: Successful placement of a 20 French pull through gastrostomy tube. Electronically Signed   By: Jacqulynn Cadet M.D.   On: 12/10/2017 09:29   US Abdomen Limited  Result Date: 12/19/2017 CLINICAL DATA:  Periumbilical nodule. EXAM: ULTRASOUND ABDOMEN LIMITED COMPARISON:  CT scan of November 04, 2017. FINDINGS: Limited sonographic evaluation of the periumbilical region demonstrates moderate size hernia. There is solid density within the hernia which may represent bowel loops, although no peristalsis is observed. The possibility of this representing enlarged lymph node or other neoplasm cannot be excluded. IMPRESSION: Moderate size periumbilical hernia is noted. Solid density is noted within the hernia which may represent bowel loop, but enlarged lymph node or neoplasm cannot be excluded. Clinical correlation is recommended. Electronically Signed   By: Marijo Conception, M.D.   On: 12/19/2017 12:44   Dg Chest Port 1 View  Result Date: 12/25/2017 CLINICAL DATA:  Sudden onset of dyspnea this morning EXAM: PORTABLE CHEST 1 VIEW COMPARISON:  Portable exam 1156 hours compared to 12/23/2017 FINDINGS: RIGHT arm PICC line tip projects over SVC. Enlargement of cardiac silhouette post CABG. Atherosclerotic calcification aorta. Pulmonary vascular congestion with diffuse interstitial infiltrates consistent with pulmonary edema, little changed from the previous exam. Probable small LEFT pleural effusion. No pneumothorax. Bones demineralized. IMPRESSION: Persistent CHF, little changed. Electronically Signed   By: Lavonia Dana M.D.   On: 12/25/2017 12:18   Dg Chest Port 1 View  Result Date: 12/23/2017 CLINICAL DATA:  Hypoxia. EXAM: PORTABLE CHEST 1 VIEW COMPARISON:  12/23/2017 FINDINGS: Postoperative changes in the mediastinum. Right PICC line with tip over the low SVC region. No pneumothorax. Cardiac enlargement with diffuse pulmonary vascular congestion. Diffuse interstitial pattern and perihilar alveolar pattern to the  lungs suggesting pulmonary edema. Probable small bilateral pleural effusions. Calcification of the  aorta. Gastrostomy tube. Degenerative changes in the spine. Old fracture deformity of the right proximal humerus. IMPRESSION: Cardiac enlargement with pulmonary vascular congestion and pulmonary edema. Small bilateral pleural effusions. Electronically Signed   By: Lucienne Capers M.D.   On: 12/23/2017 22:12   Dg Chest Port 1 View  Result Date: 12/23/2017 CLINICAL DATA:  Congestive heart failure EXAM: PORTABLE CHEST 1 VIEW COMPARISON:  Chest radiograph 12/20/2017 FINDINGS: Monitoring leads overlie the patient. Right upper extremity PICC line tip projects over the superior vena cava. Patient status post median sternotomy. Stable cardiac and mediastinal contours. Small bilateral layering pleural effusions. Bilateral mid lower lung heterogeneous opacities. IMPRESSION: Cardiomegaly. Persistent interstitial edema with small bilateral pleural effusions. Electronically Signed   By: Lovey Newcomer M.D.   On: 12/23/2017 11:24   Dg Chest Port 1 View  Result Date: 12/20/2017 CLINICAL DATA:  Shortness of breath EXAM: PORTABLE CHEST 1 VIEW COMPARISON:  12/15/2017 FINDINGS: Postoperative changes in the mediastinum. Right PICC line with tip over the low SVC region. No pneumothorax. Cardiac enlargement with pulmonary vascular congestion. Developing interstitial changes in the lungs likely representing edema. No blunting of costophrenic angles. Calcification of the aorta. Degenerative changes in the spine and shoulders. Fracture of the proximal right humerus as seen previously. IMPRESSION: Cardiac enlargement with pulmonary vascular congestion and developing interstitial edema. Electronically Signed   By: Lucienne Capers M.D.   On: 12/20/2017 22:20   Dg Chest Port 1 View  Result Date: 12/15/2017 CLINICAL DATA:  Decreased breath sounds at left lung base,hx htn,dm,cad,cardiomyopathy EXAM: PORTABLE CHEST 1 VIEW COMPARISON:  Chest x-rays dated 12/05/2017, 10/06/2016 and 05/14/2010. FINDINGS: Heart size and mediastinal  contours are stable. New RIGHT-sided PICC line in place with tip at the level of the lower SVC/cavoatrial junction. Lungs appear clear. No pleural effusion or pneumothorax seen. Median sternotomy wires appear intact and stable in alignment. No acute or suspicious osseous finding. IMPRESSION: No active disease. No evidence of pneumonia or pulmonary edema. RIGHT-sided PICC line appears well positioned with tip at the level of the lower SVC/cavoatrial junction. Electronically Signed   By: Franki Cabot M.D.   On: 12/15/2017 11:10   Dg Chest Port 1 View  Result Date: 12/05/2017 CLINICAL DATA:  Altered mental status, difficulty with speech EXAM: PORTABLE CHEST 1 VIEW COMPARISON:  Portable chest x-ray of 11/09/2016 FINDINGS: No active infiltrate or effusion is seen. Mediastinal and hilar contours are unremarkable. Mild cardiomegaly is stable as is the ectatic descending thoracic aorta. Posttraumatic degenerative change of the right humeral neck is again noted. IMPRESSION: 1. No active lung disease. 2. Stable mild cardiomegaly. Electronically Signed   By: Ivar Drape M.D.   On: 12/05/2017 10:00   [place] Ekg Site Rite  Result Date: 12/09/2017 If Site Rite image not attached, placement could not be confirmed due to current cardiac rhythm.   Lab Data:  CBC: Recent Labs  Lab 12/21/17 0608 12/22/17 1219 12/23/17 0501 12/24/17 0428 12/25/17 0435  WBC 11.7* 15.1* 14.9* 14.4* 12.5*  HGB 8.0* 7.9* 7.3* 9.6* 9.0*  HCT 26.7* 25.9* 23.6* 30.3* 28.6*  MCV 92.1 90.9 91.1 89.1 90.5  PLT 356 337 334 302 154   Basic Metabolic Panel: Recent Labs  Lab 12/19/17 1103 12/21/17 0608 12/22/17 0524 12/23/17 0501 12/24/17 0428  NA 138 140 135 130* 135  K 4.5 3.7 4.3 4.5 4.5  CL 108 107 101 95* 97*  CO2 24 25 26  27  28  GLUCOSE 172* 58* 187* 264* 128*  BUN 22 24* 30* 34* 40*  CREATININE 1.34* 1.34* 1.52* 1.54* 1.77*  CALCIUM 8.7* 9.1 8.8* 8.9 9.1  PHOS 3.2  --   --   --   --    GFR: Estimated Creatinine  Clearance: 18.2 mL/min (A) (by C-G formula based on SCr of 1.77 mg/dL (H)). Liver Function Tests: Recent Labs  Lab 12/19/17 1103 12/19/17 1737 12/20/17 1146  AST  --   --  27  ALT  --   --  21  ALKPHOS  --   --  54  BILITOT  --  0.5 0.5  PROT  --   --  5.3*  ALBUMIN 1.5*  --  1.4*   No results for input(s): LIPASE, AMYLASE in the last 168 hours. Recent Labs  Lab 12/20/17 1146  AMMONIA 11   Coagulation Profile: No results for input(s): INR, PROTIME in the last 168 hours. Cardiac Enzymes: Recent Labs  Lab 12/22/17 0524 12/23/17 2200 12/24/17 0428 12/24/17 1015 12/24/17 1646  TROPONINI 5.12* 2.13* 3.17* 3.29* 3.08*   BNP (last 3 results) No results for input(s): PROBNP in the last 8760 hours. HbA1C: Recent Labs    12/24/17 1313  HGBA1C 5.9*   CBG: Recent Labs  Lab 12/24/17 2101 12/24/17 2359 12/25/17 0351 12/25/17 0723 12/25/17 1155  GLUCAP 189* 271* 228* 222* 195*   Lipid Profile: No results for input(s): CHOL, HDL, LDLCALC, TRIG, CHOLHDL, LDLDIRECT in the last 72 hours. Thyroid Function Tests: No results for input(s): TSH, T4TOTAL, FREET4, T3FREE, THYROIDAB in the last 72 hours. Anemia Panel: No results for input(s): VITAMINB12, FOLATE, FERRITIN, TIBC, IRON, RETICCTPCT in the last 72 hours. Urine analysis:    Component Value Date/Time   COLORURINE YELLOW 12/23/2017 1324   APPEARANCEUR CLOUDY (A) 12/23/2017 1324   LABSPEC 1.010 12/23/2017 1324   PHURINE 7.0 12/23/2017 1324   GLUCOSEU NEGATIVE 12/23/2017 1324   HGBUR MODERATE (A) 12/23/2017 1324   BILIRUBINUR NEGATIVE 12/23/2017 1324   KETONESUR NEGATIVE 12/23/2017 1324   PROTEINUR 100 (A) 12/23/2017 1324   UROBILINOGEN 0.2 06/30/2011 1214   NITRITE NEGATIVE 12/23/2017 1324   LEUKOCYTESUR LARGE (A) 12/23/2017 1324     Ripudeep Rai M.D. [facility] 12/25/2017, 12:43 PM  Pager: [phone] Between 7am to 7pm - call Pager - [phone]  After 7pm go to [email or web address] - password  [email or web address]  Call night coverage person covering after 7pm

## 2017-12-25 NOTE — Progress Notes (Signed)
TF restarted.

## 2017-12-25 NOTE — Progress Notes (Addendum)
Progress Note  Patient Name: [name] Date of Encounter: 12/25/2017  Primary Cardiologist: Minus Breeding, MD   Subjective   Patient unable to participate in history. She reacts with moans when position is adjusted in bed. Does open her eyes and wave at Dr. Percival Spanish while discussing care with her son at bedside.   She is had agitation.  She says medications as above and altered mental status.  Inpatient Medications    Scheduled Meds: . sodium chloride   Intravenous Once  . alteplase  2 mg Intracatheter Once  . atorvastatin  40 mg Per Tube q1800  . Chlorhexidine Gluconate Cloth  6 each Topical Q0600  . cholecalciferol  400 Units Per Tube Daily  . clopidogrel  75 mg Per Tube Daily  . ferrous sulfate  300 mg Per Tube Daily  . flumazenil      . free water  135 mL Per Tube Q8H  . furosemide      . furosemide  20 mg Intravenous Q12H  . insulin aspart  0-15 Units Subcutaneous Q4H  . insulin glargine  20 Units Subcutaneous QHS  . levalbuterol  0.63 mg Nebulization TID   And  . ipratropium  0.5 mg Nebulization TID  . methylPREDNISolone (SOLU-MEDROL) injection  40 mg Intravenous Q12H  . metoCLOPramide (REGLAN) injection  5 mg Intravenous Q6H  . metoprolol tartrate  75 mg Oral BID  . mirtazapine  7.5 mg Per Tube QHS  . multivitamin  15 mL Per Tube Daily  . mupirocin ointment  1 application Nasal BID  . pantoprazole sodium  40 mg Per Tube BID  . potassium chloride  20 mEq Per Tube BID  . sennosides  5 mL Per Tube QHS   Continuous Infusions: . feeding supplement (JEVITY 1.2 CAL) Stopped (12/25/17 1100)  . nitroGLYCERIN 5 mcg/min (12/25/17 0800)  . piperacillin-tazobactam (ZOSYN)  IV 12.5 mL/hr at 12/25/17 0800  . vancomycin Stopped (12/25/17 0056)   PRN Meds: acetaminophen, fentaNYL, hydrALAZINE, levalbuterol, loperamide HCl, morphine injection, nitroGLYCERIN, ondansetron (ZOFRAN) IV, sodium chloride flush   Vital Signs    Vitals:   12/25/17 0900 12/25/17 1000 12/25/17  1100 12/25/17 1200  BP: 105/65 (!) 159/85 (!) 141/89 140/88  Pulse:      Resp: (!) 21 (!) 23 (!) 27   Temp:      TempSrc:      SpO2: 98% 96% (!) 89% 91%  Weight:      Height:        Intake/Output Summary (Last 24 hours) at 12/25/2017 1243 Last data filed at 12/25/2017 1200 Gross per 24 hour  Intake 1894.27 ml  Output 800 ml  Net 1094.27 ml   Filed Weights   12/20/17 2350 12/24/17 0900 12/25/17 0619  Weight: 57.6 kg 64 kg 56.2 kg    Telemetry    Sinus rhythm/ sinus tachycardia - Personally Reviewed  Physical Exam   Physical exam limited. Complete exam per Dr. Percival Spanish below  GEN: Frail elderly AAF laying in bed in no acute distress.   Respiratory: Satting well at 98% on 1L via [place] MS: No edema Neuro:  Minimally interactive  Labs    Chemistry Recent Labs  Lab 12/19/17 1103 12/19/17 1737 12/20/17 1146  12/22/17 0524 12/23/17 0501 12/24/17 0428  NA 138  --   --    < > 135 130* 135  K 4.5  --   --    < > 4.3 4.5 4.5  CL 108  --   --    < >  101 95* 97*  CO2 24  --   --    < > 26 27 28   GLUCOSE 172*  --   --    < > 187* 264* 128*  BUN 22  --   --    < > 30* 34* 40*  CREATININE 1.34*  --   --    < > 1.52* 1.54* 1.77*  CALCIUM 8.7*  --   --    < > 8.8* 8.9 9.1  PROT  --   --  5.3*  --   --   --   --   ALBUMIN 1.5*  --  1.4*  --   --   --   --   AST  --   --  27  --   --   --   --   ALT  --   --  21  --   --   --   --   ALKPHOS  --   --  54  --   --   --   --   BILITOT  --  0.5 0.5  --   --   --   --   GFRNONAA 34*  --   --    < > 29* 29* 24*  GFRAA 40*  --   --    < > 34* 34* 28*  ANIONGAP 6  --   --    < > 8 8 10    < > = values in this interval not displayed.     Hematology Recent Labs  Lab 12/23/17 0501 12/24/17 0428 12/25/17 0435  WBC 14.9* 14.4* 12.5*  RBC 2.59* 3.40* 3.16*  HGB 7.3* 9.6* 9.0*  HCT 23.6* 30.3* 28.6*  MCV 91.1 89.1 90.5  MCH 28.2 28.2 28.5  MCHC 30.9 31.7 31.5  RDW 18.2* 16.8* 17.0*  PLT 334 302 294    Cardiac  Enzymes Recent Labs  Lab 12/23/17 2200 12/24/17 0428 12/24/17 1015 12/24/17 1646  TROPONINI 2.13* 3.17* 3.29* 3.08*   No results for input(s): TROPIPOC in the last 168 hours.   BNP Recent Labs  Lab 12/20/17 1450 12/23/17 2200  BNP >4,500.0* >4,500.0*     DDimer No results for input(s): DDIMER in the last 168 hours.   Radiology    Dg Chest Port 1 View  Result Date: 12/25/2017 CLINICAL DATA:  Sudden onset of dyspnea this morning EXAM: PORTABLE CHEST 1 VIEW COMPARISON:  Portable exam 1156 hours compared to 12/23/2017 FINDINGS: RIGHT arm PICC line tip projects over SVC. Enlargement of cardiac silhouette post CABG. Atherosclerotic calcification aorta. Pulmonary vascular congestion with diffuse interstitial infiltrates consistent with pulmonary edema, little changed from the previous exam. Probable small LEFT pleural effusion. No pneumothorax. Bones demineralized. IMPRESSION: Persistent CHF, little changed. Electronically Signed   By: Lavonia Dana M.D.   On: 12/25/2017 12:18   Dg Chest Port 1 View  Result Date: 12/23/2017 CLINICAL DATA:  Hypoxia. EXAM: PORTABLE CHEST 1 VIEW COMPARISON:  12/23/2017 FINDINGS: Postoperative changes in the mediastinum. Right PICC line with tip over the low SVC region. No pneumothorax. Cardiac enlargement with diffuse pulmonary vascular congestion. Diffuse interstitial pattern and perihilar alveolar pattern to the lungs suggesting pulmonary edema. Probable small bilateral pleural effusions. Calcification of the aorta. Gastrostomy tube. Degenerative changes in the spine. Old fracture deformity of the right proximal humerus. IMPRESSION: Cardiac enlargement with pulmonary vascular congestion and pulmonary edema. Small bilateral pleural effusions. Electronically Signed   By: Oren Beckmann.D.  On: 12/23/2017 22:12    Cardiac Studies   Echo11/21/2019 Left ventricle: The cavity size was normal. Wall thickness was increased in a pattern of mild LVH. The  estimated ejection fraction was 30%. Diffuse hypokinesis. Features are consistent with a pseudonormal left ventricular filling pattern, with concomitant abnormal relaxation and increased filling pressure (grade 2 diastolic dysfunction). - Aortic valve: There was no stenosis. - Mitral valve: There was mild to moderate regurgitation. - Left atrium: The atrium was mildly dilated. - Right ventricle: The cavity size was normal. Systolic function was mildly reduced. - Pulmonary arteries: No complete TR doppler jet so unable to estimate PA systolic pressure. - Inferior vena cava: The vessel was normal in size. The respirophasic diameter changes were in the normal range (>= 50%), consistent with normal central venous pressure.  Patient Profile       84 y.o. female with CAD s/p CABG, recent ischemic stroke, HTN, DM, HLP, recent failure to thrive due to swallowing problems s/p botox injection for achalasia, developed acute pulmonary edema and NSTEMI on 12/20/2017, for which cardiology is following  Assessment & Plan    1. NSTEMI in patient with CAD s/p CABG: Trop peaked at 12.17 12/21/17 and trended down. Felt to be 2/2 volume overload/acute CHF although cannot exclude ACS given underlying CAD. She was started on IV heparin for conservative management but this was discontinued due to Hgb drop requiring blood transfusion. She is not a good candidate for cath/revasculariation given age, frailty, and multiple comorbidities.  - Continue plavix and statin  2. Acute on chronic combined CHF: Echo 11/21 with EF 30% with G2DD. Also with drop in Hgb requiring blood transfusion worsening volume overload. She remains markedly volume overloaded with persistent CHF on CXR today. Diuresis has been limited by elevated Cr (1.77 yesterday, no BMET resulted today). She was started on IV nitroglycerin for unloading. She continues to be diuresed with IV lasix but has had limited UOP with net +639mL in the  past 24 hours, with net +20L this admission. Weight up from 103lbs on admission to 124lbs today. Likely malnutrition playing a role in volume overload as well. She continues to maintain good oxygenation with O2 sat 98% on 1L via [place].  - Check BMET now - Will plan to diurese with IV lasix 60mg  BID  - Continue IV nitro - Continue to monitor strict I&Os and daily weights - Monitor electrolytes to maintain K>4, Mg >2  3. Acute respiratory failure: multifactorial 2/2 acute on chronic CHF and aspiration PNA. This morning had an episode of agitation requiring morphine and ativan. She was then noted to have an episode of unresponsiveness with shallow breathing, improved with narcan. CXR at that time with with persistent CHF. Also with aspiration PNA on IV vanc/zosyn - Continue CHF management as above - Continue supportive care and antibiotics per primary team - Limit narcotics/benzos   4. Acute on chronic renal insufficiency stage 3: baseline Cr 1.2-1.3, up to 1.77 yesterday - Check BMET now - Continue to monitor closely with diuresis  5. Anemia: Hgb stable at 9.0 today, s/p 1 [facility] 12/23/17.  - Continue management per primary team    For questions or updates, please contact [place] Please consult [email or web address] for contact info under Cardiology/STEMI.      Signed, Abigail Butts, PA-C  12/25/2017, 12:43 PM   [phone]  History and all data above reviewed.  Patient examined.  I agree with the findings as above.  The patient is chronically ill-appearing and quite  frail.  She opens her eyes and acknowledges me but cannot answer questions.  Her son is at the bedside.  There is been no apparent chest discomfort or acute decompensation of respiratory status.  Acute events from earlier today reviewed.  The patient exam reveals COR: Regular rate and rhythm,  Lungs: Decreased breath sounds with faint crackles,  Abd: Positive bowel sounds normal frequency and pitch, Ext mild diffuse edema.   All available labs, radiology testing, previous records reviewed. Agree with documented assessment and plan.  Acute on chronic systolic and diastolic heart failure: This is a very difficult situation.  She is about 20 pounds up.  She has malnourishment.  She has change in neurologic status.  She has progressive renal insufficiency.  She has her significant problems from a GI standpoint.  Since I last saw her in February she has had a very significant decline and looks extremely frail.  On top of this she has had a decline in her ejection fraction with positive cardiac enzymes suggesting a non-STEMI this admission.  However, she is clearly not a cardiac catheterization candidate.  I had thought to try to further diurese her though she is oxygenating well on just 2 L.  However, her progressive renal insufficiency will not allow this.  She did get 60 mg of IV Lasix already this morning.  She was started yesterday on a low dose of nitrate but I suspect only is the short-term.  Cannot use afterload reduction with ACE inhibitors or ARBs.  Could consider low-dose inotrope but overall I think the prognosis is very poor given her advanced age, comorbid illnesses and frailty.  Her son has not ready for palliative consultation apparently and I will defer to the primary team.  Minus Breeding  3:21 PM  12/25/2017

## 2017-12-25 NOTE — Progress Notes (Signed)
PT Cancellation Note  Patient Details Name: [name] MRN: [id number] DOB: 1927-10-05   Cancelled Treatment:    Reason Eval/Treat Not Completed: Medical issues which prohibited therapy Per nursing, hold therapies today due to medical status. PT to continue to follow.    Lanney Gins, PT, DPT Supplemental Physical Therapist 12/25/17 12:39 PM Pager: [phone] Office: [phone]

## 2017-12-25 NOTE — Progress Notes (Signed)
Noted Cr+ level from recent draw, 2.28 up from 1.77. Due to get increased dose lasix this evening. Cards PA made aware of result. Will await any changes in orders.

## 2017-12-25 NOTE — Progress Notes (Addendum)
Nutrition Follow-up  DOCUMENTATION CODES:   Non-severe (moderate) malnutrition in context of chronic illness, Underweight  INTERVENTION:   -Continue Jevity 1.2 @ 25 ml/hr via PEG and increase by 5 ml every 6 hours to goal rate of 55 ml/hr.   135 ml free water flush TID  Complete TF regimen providing 1584 kcals, 73 grams protein, and 1470 ml free water, which meets 100% of estimated kcal and protein needs.   -Contine 15 ml liquid MVI daily per tube  NUTRITION DIAGNOSIS:   Moderate Malnutrition related to chronic illness(dysphagia 2/2 to achalasia) as evidenced by energy intake < or equal to 75% for > or equal to 1 month, mild fat depletion, moderate fat depletion, mild muscle depletion, moderate muscle depletion, energy intake < or equal to 50% for > or equal to 1 month.  Ongoing  GOAL:   Patient will meet greater than or equal to 90% of their needs  Progressing  MONITOR:   PO intake, Supplement acceptance, Labs, Weight trends, Skin, I & O's  REASON FOR ASSESSMENT:   Consult Assessment of nutrition requirement/status, Poor PO  ASSESSMENT:   [name] is a 84 y.o. female with medical history significant for DM2, HTN, HLD, CAD s/p CABG, h/o CVA, CKD3, who was recently diagnosed with achalasia after a 50 lb weight loss over the past year. She was treated with botox earlier in October and sent to SNF with the hopes she would be able to swallow her food. She has not done well since that time, has only been able to drink small amounts of milk. Son is a Management consultant in [place] and has been visiting. Yesterday he noticed she was stuttering, having difficulty getting words out, and progressed until she seemed to have total aphasia last night. She does report having dysuria and some lower abd pain. No N/V. Has a good appetite but avoids food because of the obstruction. Megace and remeron have not helped. Process of being worked up for G tube has been initiated and pt reportedly  has appt with [facility] tomorrow morning. Son feels like her weight loss and FTT could be reversible if she gets a G tube as she was reportedly very functional a month ago, walking with a walker and performing all of her ADLs.   11/7- per IR notes, imaging revealed moderate sized hiatal hernia and colon in upper abdomen; recommending evaluating anatomy with fluoroscopy  11/8- refusing PO medications 11/8- PICC placed 11/10- TPN initiated, G-tube placed 11/11- G-tube cleared to use by IR; TF initiated 11/20- transitioned to bolus feedings 11/23- s/p BSE- recommend continued NPO due to AMS, emesis with bolus feedings, rapid response called  RD re-consulted due to pt transitioning back to bolus feedings.   Pt lying in bed at time of visit. Pt agitated and did not respond to this RD when greeted.   Jevity 1.2 currently infusing via PEG at 25 ml/hr. Pt also receiving 250 ml free water flush every 8 hours. Complete regimen providing 720 kcals, 33 grams protein, and 1231 ml free water daily, meeting (50% of estimated kcal needs and 55% of estimated protein needs).   Labs reviewed: CBGS: 189-271 (inpatient orders for glycemic control are 0-15 units insulin aspaert every 4 hours and 20 units insulin glargine q HS). Noted pt on solu-medrol, which along with continue tube feedings and infection/acute stress response, is likely contributing to elevated CBGS.   Diet Order:   Diet Order  Diet NPO time specified  Diet effective now              EDUCATION NEEDS:   Education needs have been addressed  Skin:  Skin Integrity Issues:: Stage II, Other (Comment) Stage II: coccyx Other: partial thickness wounds to bilateral buttocks (likely from adhesive)  Last BM:  12/23/17  Height:   Ht Readings from Last 1 Encounters:  12/20/17 5\' 3"  (1.6 m)    Weight:   Wt Readings from Last 1 Encounters:  12/25/17 56.2 kg    Ideal Body Weight:  52.3 kg  BMI:  Body mass index is 21.97  kg/m.  Estimated Nutritional Needs:   Kcal:  1450-1650  Protein:  60-75 grams  Fluid:  > 1.4 L    [name] A. Jimmye Norman, RD, LDN, CDE Pager: [phone] After hours Pager: [phone]

## 2017-12-25 NOTE — Progress Notes (Signed)
Concern for aspiration with patient constantly restless and sliding down in bed unable to be @ 30 degrees. MD aware, tube feeds being held for now. Water flush also decreased from 250 to 135 q8h

## 2017-12-25 NOTE — Progress Notes (Signed)
Per MD Rai, we will hold reglan while tf are not infusing.

## 2017-12-25 NOTE — Progress Notes (Signed)
Increased pt's tube feed from 81ml/hr to 79ml/hr as per order; no residual currently noted.

## 2017-12-25 NOTE — Progress Notes (Addendum)
Pt continues to be restless. MD paged to assess.  cxr ordered. abg being drawn. Pt responding well to emotional support, remains on 3L Sp02 93.

## 2017-12-25 NOTE — Progress Notes (Signed)
Pt has no residual at this time, restarting tube feed at 8ml/hr.

## 2017-12-25 NOTE — Progress Notes (Signed)
   BMET results with Cr 2.28, up from 1.77 yesterday. She has received 60mg  IV lasix so far today. Will hold off on further diuretics pending repeat BMET in AM.   Abigail Butts, PA-C 12/25/17

## 2017-12-25 NOTE — Progress Notes (Signed)
Rapid nurse at bedside to assess. ABG normal, CXR unchanged. Narcan small dose 0.2, pt more alert, following commands intermittently. VS stable, remains on 4L nasal cannula Sp02 >93. Pt states yes when asked if she is having discomfort. Will hold all further benzo and narcotics for now. Pt does have prn tylenol. MD to add additional meds as needed. Pt resting comfortably.   Concern for urinary retention despite foley, bladder scan <50 ml. CTM.

## 2017-12-25 NOTE — Progress Notes (Signed)
Restless. VSS, tachy to 120's 2/2 restlessness. Seems to be hallucinating. MD paged to make aware. Awaiting callback. Safety and aspiration precautions.

## 2017-12-25 NOTE — Care Management Important Message (Signed)
Important Message  Patient Details  Name: [name] MRN: [id number] Date of Birth: 05-May-1927   Medicare Important Message Given:  Yes    Zenon Mayo, RN 12/25/2017, 1:07 PM

## 2017-12-26 LAB — CBC
HCT: 30.9 % — ABNORMAL LOW (ref 36.0–46.0)
Hemoglobin: 9.4 g/dL — ABNORMAL LOW (ref 12.0–15.0)
MCH: 28.1 pg (ref 26.0–34.0)
MCHC: 30.4 g/dL (ref 30.0–36.0)
MCV: 92.5 fL (ref 80.0–100.0)
NRBC: 0 % (ref 0.0–0.2)
PLATELETS: 332 10*3/uL (ref 150–400)
RBC: 3.34 MIL/uL — ABNORMAL LOW (ref 3.87–5.11)
RDW: 17 % — AB (ref 11.5–15.5)
WBC: 16.1 10*3/uL — ABNORMAL HIGH (ref 4.0–10.5)

## 2017-12-26 LAB — BASIC METABOLIC PANEL
Anion gap: 10 (ref 5–15)
BUN: 63 mg/dL — ABNORMAL HIGH (ref 8–23)
CO2: 28 mmol/L (ref 22–32)
Calcium: 9.4 mg/dL (ref 8.9–10.3)
Chloride: 99 mmol/L (ref 98–111)
Creatinine, Ser: 2.52 mg/dL — ABNORMAL HIGH (ref 0.44–1.00)
GFR calc non Af Amer: 16 mL/min — ABNORMAL LOW (ref >60)
GFR, EST AFRICAN AMERICAN: 19 mL/min — AB (ref >60)
Glucose, Bld: 205 mg/dL — ABNORMAL HIGH (ref 70–99)
Potassium: 5 mmol/L (ref 3.5–5.1)
Sodium: 137 mmol/L (ref 135–145)

## 2017-12-26 LAB — GLUCOSE, CAPILLARY
Glucose-Capillary: 231 mg/dL — ABNORMAL HIGH (ref 70–99)
Glucose-Capillary: 209 mg/dL — ABNORMAL HIGH (ref 70–99)
Glucose-Capillary: 202 mg/dL — ABNORMAL HIGH (ref 70–99)
Glucose-Capillary: 120 mg/dL — ABNORMAL HIGH (ref 70–99)
Glucose-Capillary: 183 mg/dL — ABNORMAL HIGH (ref 70–99)
Glucose-Capillary: 207 mg/dL — ABNORMAL HIGH (ref 70–99)

## 2017-12-26 LAB — URINE CULTURE: Culture: >=100000 — AB

## 2017-12-26 MED ORDER — HYDRALAZINE HCL 10 MG PO TABS
10.0000 mg | ORAL_TABLET | Freq: Three times a day (TID) | ORAL | Status: DC
  Administered 2017-12-26 – 2018-01-01 (×17): 10 mg via ORAL
  Filled 2017-12-26 (×18): qty 1

## 2017-12-26 MED ORDER — INSULIN DETEMIR 100 UNIT/ML ~~LOC~~ SOLN
10.0000 [IU] | Freq: Two times a day (BID) | SUBCUTANEOUS | Status: DC
  Administered 2017-12-26 – 2018-01-10 (×26): 10 [IU] via SUBCUTANEOUS
  Filled 2017-12-26 (×34): qty 0.1

## 2017-12-26 NOTE — Progress Notes (Signed)
At [facility] request, I this nurse paged [facility] to speak with patient about medication treatment change. Per [facility] at this time they will not be changing orders for patient care. Will continue to treat and monitor patient per orders.

## 2017-12-26 NOTE — Progress Notes (Signed)
  Speech Language Pathology Treatment: Dysphagia  Patient Details Name: [name] MRN: [id number] DOB: 1927/05/14 Today's Date: 12/26/2017 Time: 9373-4287 SLP Time Calculation (min) (ACUTE ONLY): 9 min  Assessment / Plan / Recommendation Clinical Impression  Pt is still cognitively not safe to attempt POs. She is not able to take in any sips of water or ice chips provided. At one point she opened her mouth and started leaning forward toward the straw, as if to take a sip, but she need up swatting at the cup with her hands instead. Aspiration risk remains high. Will continue to follow.   HPI HPI: 84 y.o. female with CAD s/p CABG, recent ischemic stroke, HTN, DM, HLP, recent failure to thrive due to swallowing problems s/p botox injection for achalasia, developed acute pulmonary edema and NSTEMI on 12/20/2017. SLP consulted during recent admission; bedside swallow on 11/05/17 revealed functional oropharyngeal swallow and suspected esophageal dysphagia. Had esophagram 11/06/17 which showed moderately large hiatal hernia with poor motility and retained barium throughout the esophagus. CXR 12/20/17 showed no pneumonia; pt has had low-grade fevers.      SLP Plan  Continue with current plan of care       Recommendations  Diet recommendations: NPO Medication Administration: Via alternative means                Oral Care Recommendations: Oral care QID Follow up Recommendations: (tba) SLP Visit Diagnosis: Dysphagia, unspecified (R13.10) Plan: Continue with current plan of care       GO                [name] 12/26/2017, 2:04 PM  [name], M.A. [place] Acute Environmental education officer [phone] Office [phone]

## 2017-12-26 NOTE — Progress Notes (Signed)
Morphine available to give pt at this time. Son states "hold the morphine for now, I want them to see how restless she is" morphine held at this time per family request

## 2017-12-26 NOTE — Progress Notes (Signed)
Physical Therapy Treatment Patient Details Name: [name] MRN: [id number] DOB: Mar 01, 1927 Today's Date: 12/26/2017    History of Present Illness 84 yo female admitted on 12/05/17 with onset of UTI and AMS was admitted with ongoing weight loss and dehydration with FTT.  S/p G-tube placement on 12/10/17 with ongoing confusion.  Dx with acute respiratory failure with hypoxia due to pulmonary edema, NSTEMI possibly due to volume overload acute HF, acute metabolic encepholopathy (MRI negative for acute infarcts), aspiration PNA, anemia (acute on chronic), sacral pressure wound.  PMHx:  CAD, AKI, angina, cardiomegaly, CABG, CVA with L hemi, CKD 3, transfusion, pleural effusion, DM, achalasia with botox procedure.     PT Comments    Goals updated.  Pt very confused and restless today.  She was able to sit up momentarily x 3 tries, but within seconds flops her trunk back down to the bed or leans forward to rest her head and body on my shoulder.  She is unable to tell me her name and when she does speak it is garbled and does not make much sense.  She is not safe to transfer even with lift OOB to chair right now.  PT will continue to follow acutely for safe mobility progression.   Follow Up Recommendations  SNF;Supervision/Assistance - 24 hour     Equipment Recommendations  Hospital bed;Other (comment)(hoyer lift)    Recommendations for Other Services   NA     Precautions / Restrictions Precautions Precautions: Fall Precaution Comments: coccygeal wound, Prevalon boots/ Peg 11/10 with abdominal binder, geo mat    Mobility  Bed Mobility Overal bed mobility: Needs Assistance Bed Mobility: Supine to Sit;Sit to Supine   Sidelying to sit: Max assist   Sit to supine: Max assist   General bed mobility comments: Max assist to assist pt to her side and sit supporting trunk and intitiating movement of legs to EOB.  Pt not resisting, but also no helping much.    Transfers                  General transfer comment: Unable to safely attempt and pt is not safe to be up in a chair right now even with an alarm.   Ambulation/Gait             General Gait Details: unable at this time.           Balance Overall balance assessment: Needs assistance Sitting-balance support: Feet supported;Bilateral upper extremity supported Sitting balance-Leahy Scale: Zero Sitting balance - Comments: max assist EOB.  Pt able to sit up supported momentarily and then flops back down to the bed with her trunk, we did multiple breif sits EOB before pt fatigued and was repositioned back in the bed.                                      Cognition Arousal/Alertness: Awake/alert Behavior During Therapy: Restless Overall Cognitive Status: Impaired/Different from baseline Area of Impairment: Orientation;Attention;Memory;Following commands;Safety/judgement;Awareness;Problem solving                 Orientation Level: Disoriented to;Person;Place;Time;Situation Current Attention Level: Focused Memory: Decreased recall of precautions;Decreased short-term memory Following Commands: (did not follow any commands for me today) Safety/Judgement: Decreased awareness of safety;Decreased awareness of deficits Awareness: Intellectual Problem Solving: Decreased initiation;Difficulty sequencing;Requires verbal cues;Requires tactile cues General Comments: Pt alert, restless in the bed in mittens, pulling at lines.  Able to sit EOB multiple times momentarily with me, and once repositioned back int the bed, became more restless         General Comments General comments (skin integrity, edema, etc.): RN notified that pt is becoming restless again and RN present at end of session.  Low bed positioned in lowest position with alarm on medium sensitivity.  Bil prevlon boots re applied for better fit.  VSS.       Pertinent Vitals/Pain Pain Assessment: Faces Faces Pain Scale: No hurt            PT Goals (current goals can now be found in the care plan section) Acute Rehab PT Goals Patient Stated Goal: unable to state, not making much sense PT Goal Formulation: Patient unable to participate in goal setting Time For Goal Achievement: 01/09/18 Potential to Achieve Goals: Fair Progress towards PT goals: Progressing toward goals(goals downgraded)    Frequency    Min 2X/week      PT Plan Current plan remains appropriate       AM-PAC PT "6 Clicks" Mobility   Outcome Measure  Help needed turning from your back to your side while in a flat bed without using bedrails?: A Lot Help needed moving from lying on your back to sitting on the side of a flat bed without using bedrails?: A Lot Help needed moving to and from a bed to a chair (including a wheelchair)?: Total Help needed standing up from a chair using your arms (e.g., wheelchair or bedside chair)?: Total Help needed to walk in hospital room?: Total Help needed climbing 3-5 steps with a railing? : Total 6 Click Score: 8    End of Session Equipment Utilized During Treatment: Oxygen Activity Tolerance: Other (comment)(limited by cognition/restlessness) Patient left: in bed;with call bell/phone within reach;with bed alarm set;with nursing/sitter in room;with restraints reapplied Nurse Communication: Mobility status PT Visit Diagnosis: Muscle weakness (generalized) (M62.81);Other abnormalities of gait and mobility (R26.89);Adult, failure to thrive (R62.7);Hemiplegia and hemiparesis Hemiplegia - Right/Left: Left Hemiplegia - dominant/non-dominant: Non-dominant Hemiplegia - caused by: Unspecified     Time: 9702-6378 PT Time Calculation (min) (ACUTE ONLY): 15 min  Charges:  $Therapeutic Activity: 8-22 mins          Elizaveta Mattice B. Fendi Meinhardt, PT, DPT  Acute Rehabilitation [phone] pager #[phone] office            12/26/2017, 1:06 PM

## 2017-12-26 NOTE — Progress Notes (Addendum)
Progress Note  Patient Name: [name] Date of Encounter: 12/26/2017  Primary Cardiologist: Minus Breeding, MD   Subjective   Patient appears more agitated and confused this morning. Unable to provide history. No events reported overnight.   Inpatient Medications    Scheduled Meds: . sodium chloride   Intravenous Once  . alteplase  2 mg Intracatheter Once  . atorvastatin  40 mg Per Tube q1800  . Chlorhexidine Gluconate Cloth  6 each Topical Q0600  . cholecalciferol  400 Units Per Tube Daily  . clopidogrel  75 mg Per Tube Daily  . ferrous sulfate  300 mg Per Tube Daily  . free water  135 mL Per Tube Q8H  . insulin aspart  0-15 Units Subcutaneous Q4H  . insulin glargine  20 Units Subcutaneous QHS  . levalbuterol  0.63 mg Nebulization TID   And  . ipratropium  0.5 mg Nebulization TID  . methylPREDNISolone (SOLU-MEDROL) injection  40 mg Intravenous Q12H  . metoCLOPramide (REGLAN) injection  5 mg Intravenous Q6H  . metoprolol tartrate  75 mg Oral BID  . mirtazapine  7.5 mg Per Tube QHS  . multivitamin  15 mL Per Tube Daily  . mupirocin ointment  1 application Nasal BID  . pantoprazole sodium  40 mg Per Tube BID  . potassium chloride  20 mEq Per Tube BID  . sennosides  5 mL Per Tube QHS   Continuous Infusions: . feeding supplement (JEVITY 1.2 CAL) 55 mL/hr at 12/26/17 0227  . nitroGLYCERIN 5 mcg/min (12/26/17 0229)  . piperacillin-tazobactam (ZOSYN)  IV 2.25 g (12/26/17 0824)  . vancomycin     PRN Meds: acetaminophen, fentaNYL, hydrALAZINE, levalbuterol, loperamide HCl, morphine injection, nitroGLYCERIN, ondansetron (ZOFRAN) IV, sodium chloride flush   Vital Signs    Vitals:   12/26/17 0037 12/26/17 0733 12/26/17 0849 12/26/17 0918  BP: 118/86 106/72 116/71   Pulse: 90  93   Resp: (!) 21 12    Temp: 98.9 F (37.2 C) 97.8 F (36.6 C)    TempSrc:  Axillary    SpO2: 100% 100%  100%  Weight:      Height:        Intake/Output Summary (Last 24 hours) at  12/26/2017 1035 Last data filed at 12/26/2017 0400 Gross per 24 hour  Intake 464.92 ml  Output 425 ml  Net 39.92 ml   Filed Weights   12/20/17 2350 12/24/17 0900 12/25/17 0619  Weight: 57.6 kg 64 kg 56.2 kg    Telemetry    Sinus rhythm/sinus tachycardia - Personally Reviewed   Physical Exam   GEN: restless in bed, flailing arms, mittens on.   Neck: unable to assess JVD due to patient positioning/ cooperation Cardiac: mildly tachycardic, regular rhythm, no murmurs, rubs, or gallops.  Respiratory: decreased breath sounds; difficult to assess further given patient positioning/cooperation GI: NABS, Soft, nontender, non-distended  MS: mild diffuse edema; No deformity. Neuro:  Nonfocal, moving all extremities spontaneously Psych: Demented  Labs    Chemistry Recent Labs  Lab 12/19/17 1103 12/19/17 1737 12/20/17 1146  12/24/17 0428 12/25/17 1331 12/26/17 0519  NA 138  --   --    < > 135 136 137  K 4.5  --   --    < > 4.5 5.0 5.0  CL 108  --   --    < > 97* 99 99  CO2 24  --   --    < > 28 29 28   GLUCOSE 172*  --   --    < >  128* 216* 205*  BUN 22  --   --    < > 40* 56* 63*  CREATININE 1.34*  --   --    < > 1.77* 2.28* 2.52*  CALCIUM 8.7*  --   --    < > 9.1 9.5 9.4  PROT  --   --  5.3*  --   --   --   --   ALBUMIN 1.5*  --  1.4*  --   --   --   --   AST  --   --  27  --   --   --   --   ALT  --   --  21  --   --   --   --   ALKPHOS  --   --  54  --   --   --   --   BILITOT  --  0.5 0.5  --   --   --   --   GFRNONAA 34*  --   --    < > 24* 18* 16*  GFRAA 40*  --   --    < > 28* 21* 19*  ANIONGAP 6  --   --    < > 10 8 10    < > = values in this interval not displayed.     Hematology Recent Labs  Lab 12/24/17 0428 12/25/17 0435 12/26/17 0519  WBC 14.4* 12.5* 16.1*  RBC 3.40* 3.16* 3.34*  HGB 9.6* 9.0* 9.4*  HCT 30.3* 28.6* 30.9*  MCV 89.1 90.5 92.5  MCH 28.2 28.5 28.1  MCHC 31.7 31.5 30.4  RDW 16.8* 17.0* 17.0*  PLT 302 294 332    Cardiac  Enzymes Recent Labs  Lab 12/23/17 2200 12/24/17 0428 12/24/17 1015 12/24/17 1646  TROPONINI 2.13* 3.17* 3.29* 3.08*   No results for input(s): TROPIPOC in the last 168 hours.   BNP Recent Labs  Lab 12/20/17 1450 12/23/17 2200  BNP >4,500.0* >4,500.0*     DDimer No results for input(s): DDIMER in the last 168 hours.   Radiology    Dg Chest Port 1 View  Result Date: 12/25/2017 CLINICAL DATA:  Sudden onset of dyspnea this morning EXAM: PORTABLE CHEST 1 VIEW COMPARISON:  Portable exam 1156 hours compared to 12/23/2017 FINDINGS: RIGHT arm PICC line tip projects over SVC. Enlargement of cardiac silhouette post CABG. Atherosclerotic calcification aorta. Pulmonary vascular congestion with diffuse interstitial infiltrates consistent with pulmonary edema, little changed from the previous exam. Probable small LEFT pleural effusion. No pneumothorax. Bones demineralized. IMPRESSION: Persistent CHF, little changed. Electronically Signed   By: Lavonia Dana M.D.   On: 12/25/2017 12:18    Cardiac Studies   Echo11/21/2019 Left ventricle: The cavity size was normal. Wall thickness was increased in a pattern of mild LVH. The estimated ejection fraction was 30%. Diffuse hypokinesis. Features are consistent with a pseudonormal left ventricular filling pattern, with concomitant abnormal relaxation and increased filling pressure (grade 2 diastolic dysfunction). - Aortic valve: There was no stenosis. - Mitral valve: There was mild to moderate regurgitation. - Left atrium: The atrium was mildly dilated. - Right ventricle: The cavity size was normal. Systolic function was mildly reduced. - Pulmonary arteries: No complete TR doppler jet so unable to estimate PA systolic pressure. - Inferior vena cava: The vessel was normal in size. The respirophasic diameter changes were in the normal range (>= 50%), consistent with normal central venous pressure.   Patient Profile  84  y.o.femalewith CAD s/p CABG, recent ischemic stroke, HTN, DM, HLP, recent failure to thrive due to swallowing problems s/p botox injection for achalasia, developed acute pulmonary edema and NSTEMI on 12/20/2017, for which cardiology is following  Assessment & Plan    1. NSTEMI in patient with CAD s/p CABG: Trop peaked at 12.17 12/21/17 and trended down. Felt to be 2/2 volume overload/acute CHF although cannot exclude ACS given underlying CAD. She was started on IV heparin for conservative management but this was discontinued due to Hgb drop requiring blood transfusion. Patient has declined significantly throughout the year. She is not a good candidate for cath/revasculariation given age, frailty, and multiple comorbidities.  - Continue plavix and statin  2. Acute on chronic combined CHF: Echo 11/21 with EF 30% with G2DD. Also with drop in Hgb requiring blood transfusion worsening volume overload. She remains markedly volume overloaded with persistent CHF on CXR today. Diuresis has been limited by elevated Cr - now up to 2.58 today. She received a total of 60mg  IV lasix yesterday with UOP with net +464mL in the past 24 hours, with net +17L this admission. Weight up from 103lbs on admission to 124lbs yesterday. She was started on IV nitroglycerin for unloading. Additional diuretics on hold given continued rise in Cr. Likely malnutrition playing a role in volume overload as well. She continues to maintain good oxygenation with O2 sat 100% on 1L via [place].  -  Continue to hold diuretics - Unable to add ACEi/ARB due to elevated Cr - Continue IV nitro - Continue to monitor strict I&Os and daily weights - Monitor electrolytes to maintain K>4, Mg >2  3. Acute respiratory failure: multifactorial 2/2 acute on chronic CHF and aspiration PNA. This morning had an episode of agitation requiring morphine and ativan. She was then noted to have an episode of unresponsiveness with shallow breathing, improved with  narcan. CXR at that time with with persistent CHF. Also with aspiration PNA on IV vanc/zosyn - Continue CHF management as above - Continue supportive care and antibiotics per primary team - Limit narcotics/benzos   4. Acute on chronic renal insufficiency stage 3: baseline Cr 1.2-1.3, continues to uptrend to 2.58 today  - Check BMET now - Continue to monitor closely with diuresis  5. Anemia: Hgb stable at 9.4 today, s/p 1 [facility] 12/23/17.  - Continue management per primary team  Patients status continues to decline. Son hopeful for recovery and return to independent lifestyle. Palliative care has seen this admission however son wished for patient to remain a full code in an effort to prolong her life.   For questions or updates, please contact [place] Please consult [email or web address] for contact info under Cardiology/STEMI.      Signed, Abigail Butts, PA-C  12/26/2017, 10:35 AM   [phone]  History and all data above reviewed.  Patient examined.  Agitated, confused, unable to answer questions.  I agree with the findings as above.  The patient exam reveals COR: RRR  ,  Lungs: Expiratory wheezing  ,  Abd: Positive bowel sounds, no rebound no guarding, Ext Mild edema  .  All available labs, radiology testing, previous records reviewed. Agree with documented assessment and plan. Acute on chronic systolic HF:  Minimal urine output with Lasix yesterday and the creat is up.  She remains on IV NTG and we could try to add a low dose of hydralazine VT for afterload reduction.  However, I think that this is not going to address the larger  issue of her worsening mental status and continued malnutrition along with other comorbid conditions.  At this point, I don't think that there is a role for inotropic agents and we cannot attempt to push diuretics further.  Continue discussions of palliative care with the family.    Jeneen Rinks Haim Hansson  11:20 AM  12/26/2017

## 2017-12-26 NOTE — Progress Notes (Addendum)
[facility]                                                                              Patient Demographics  [name], is a 84 y.o. female, DOB - 07-22-1927, PIR:[id number]  Admit date - 12/05/2017   Admitting Physician Janora Norlander, MD  Outpatient Primary MD for the patient is Deland Pretty, MD  Outpatient specialists:   LOS - 17  days   Medical records reviewed and are as summarized below:    Chief Complaint  Patient presents with  . Altered Mental Status       Brief summary   [name] is a 84 y.o. femalewith medical history significant forDM2, HTN, HLD, CAD s/p CABG, h/o CVA, CKD3. She presented secondary to dysarthria. MRI significant for no stroke. Also with failure to thrive with PEG tube placed. Working up confusion.    Assessment & Plan    Principal Problem: Acute respiratory failure with hypoxia secondary to pulmonary edema, acute combined systolic and diastolic CHF - On 63/01, patient had rapid response with acute pulmonary edema, likely due to volume overload, BNP elevated, placed on IV Lasix.  Patient was found to be 27 L positive, had been receiving tube feeds, free water and IV fluids during hospitalization. Repeat echo showed EF of 30% with grade 2 diastolic dysfunction, troponins trended up to 12.1 -Prolonged hospitalization with episodes of agitation, respiratory distress, aspiration.  Patient had acute respiratory distress on 12/25/2017.   -At the time of my examination this morning, was alert and resting, son at the bedside, states she had a 'good' night.   -Received Lasix 60 mg total yesterday, creatinine now trending up, Lasix placed on hold.  Monitor I's and O's closely, 17 L positive still, likely also third spacing.  NSTEMI possibly due to volume overload/acute HF -Troponins elevated at 8.87, trended up to 12.1, patient was placed on IV heparin drip and cardiology was consulted. -Recent 2D echo on 11/09/2017 had shown EF  of 35 to 50% with grade 1 diastolic dysfunction.  Repeat echo showed EF of 30% with grade 2 diastolic dysfunction, diffuse hypokinesis -Heparin drip off after 48 hours, continue nitro drip per cardiology recommendations, continue Plavix and statin.    Acute metabolic encephalopathy -Patient had presented with dysarthria, confusion.  After discussion with the patient's son (physician), he states that patient does not have dementia.  -MRI of the brain showed chronic advanced atherosclerosis disease, chronic lacunar infarcts however no new infarct. -UA was positive 11/5, had shown more than 100,000 colonies of Enterococcus faecalis.  Repeat urine culture on 11/13, showed no growth -LFTs, ammonia level normal.  Neurology evaluation done on 11/20, seen by Dr. Cheral Marker, likely patient has vascular dementia due to chronic infarcts and chronic atherosclerotic disease. -Per patient's son at the bedside today, her mental status is improving  Aspiration PNA -Continue IV vancomycin and Zosyn. -Aspiration precautions.    Anemia: Acute on chronic -Baseline appears to be 7.5-8.  FOBT negative, not far from her baseline of 7.5.  May have been hemodilution. - Epo level is slightly above normal, received 2 units packed RBC transfusion for hemoglobin  of 7.3 on 11/23 given NSTEMI and demand ischemia.  Heparin drip was discontinued after 48 hours. -H&H stable    Diabetes mellitus ([place]) type II, uncontrolled -Continue sliding scale insulin to every 4 hours -Change Levemir to split dose, 10 units twice daily  Failure to thrive, status post PEG tube, dysphagia -Off TPN -High residuals on tube feeds, placed on scheduled Reglan and restart tube feeds at low-dose.  Pressure injury Stage II partial-thickness with shallow open ulcer, red-pink wound bed with slough, medial sacrum, present on admission Wound care per nursing  Subcutaneous nodule; non-incarcerated umbilical hernia probably lymph node Seen by surgery,  and recommended no surgical treatment at this time  Acute kidney injury on CKD stage III -Baseline creatinine 1.3-1.4 -Creatinine trending up secondary to diuresis, currently Lasix on hold  Code Status: Full CODE STATUS DVT Prophylaxis:  SCD's Family Communication: Discussed in detail with the patient, all imaging results, lab results explained to the patient's son at the bedside   Disposition Plan: Per patient's son, Dr. Mayer Masker, been ready for discharge, she will go home. Critical care time Spent in minutes 45 mins   Procedures:  MRI brain 2D echo  Consultants:   Palliative medicine Neurology Cardiology  Antimicrobials:      Medications  Scheduled Meds: . sodium chloride   Intravenous Once  . alteplase  2 mg Intracatheter Once  . atorvastatin  40 mg Per Tube q1800  . Chlorhexidine Gluconate Cloth  6 each Topical Q0600  . cholecalciferol  400 Units Per Tube Daily  . clopidogrel  75 mg Per Tube Daily  . ferrous sulfate  300 mg Per Tube Daily  . free water  135 mL Per Tube Q8H  . hydrALAZINE  10 mg Oral Q8H  . insulin aspart  0-15 Units Subcutaneous Q4H  . insulin detemir  10 Units Subcutaneous BID  . levalbuterol  0.63 mg Nebulization TID   And  . ipratropium  0.5 mg Nebulization TID  . methylPREDNISolone (SOLU-MEDROL) injection  40 mg Intravenous Q12H  . metoCLOPramide (REGLAN) injection  5 mg Intravenous Q6H  . metoprolol tartrate  75 mg Oral BID  . mirtazapine  7.5 mg Per Tube QHS  . multivitamin  15 mL Per Tube Daily  . mupirocin ointment  1 application Nasal BID  . pantoprazole sodium  40 mg Per Tube BID  . potassium chloride  20 mEq Per Tube BID  . sennosides  5 mL Per Tube QHS   Continuous Infusions: . feeding supplement (JEVITY 1.2 CAL) 55 mL/hr at 12/26/17 0227  . nitroGLYCERIN 5 mcg/min (12/26/17 1000)  . piperacillin-tazobactam (ZOSYN)  IV Stopped (12/26/17 0855)  . vancomycin     PRN Meds:.acetaminophen, fentaNYL, hydrALAZINE, levalbuterol,  loperamide HCl, morphine injection, nitroGLYCERIN, ondansetron (ZOFRAN) IV, sodium chloride flush   Antibiotics   Anti-infectives (From admission, onward)   Start     Dose/Rate Route Frequency Ordered Stop   12/26/17 2200  vancomycin (VANCOCIN) IVPB 750 mg/150 ml premix     750 mg 150 mL/hr over 60 Minutes Intravenous Every 48 hours 12/25/17 1520     12/25/17 2200  piperacillin-tazobactam (ZOSYN) IVPB 2.25 g     2.25 g 100 mL/hr over 30 Minutes Intravenous Every 8 hours 12/25/17 1520     12/24/17 0030  vancomycin (VANCOCIN) 500 mg in sodium chloride 0.9 % 100 mL IVPB  Status:  Discontinued     500 mg 100 mL/hr over 60 Minutes Intravenous Every 24 hours 12/24/17 0017 12/25/17 1520  12/24/17 0030  piperacillin-tazobactam (ZOSYN) IVPB 3.375 g  Status:  Discontinued     3.375 g 12.5 mL/hr over 240 Minutes Intravenous Every 8 hours 12/24/17 0017 12/25/17 1520   12/10/17 0919  ceFAZolin (ANCEF) IVPB 1 g/50 mL premix  Status:  Discontinued     over 30 Minutes Intravenous Continuous PRN 12/10/17 0919 12/17/17 1336   12/10/17 0859  ceFAZolin (ANCEF) 2-4 GM/100ML-% IVPB    Note to Pharmacy:  Margaretmary Dys   : cabinet override      12/10/17 0859 12/10/17 2114   12/08/17 2200  ampicillin (OMNIPEN) 1 g in sodium chloride 0.9 % 100 mL IVPB  Status:  Discontinued     1 g 300 mL/hr over 20 Minutes Intravenous Every 8 hours 12/08/17 1535 12/12/17 1837   12/07/17 0600  ceFAZolin (ANCEF) IVPB 2g/100 mL premix     2 g 200 mL/hr over 30 Minutes Intravenous To Short Stay 12/06/17 1219 12/08/17 0600   12/06/17 1200  cefTRIAXone (ROCEPHIN) 1 g in sodium chloride 0.9 % 100 mL IVPB  Status:  Discontinued     1 g 200 mL/hr over 30 Minutes Intravenous Every 24 hours 12/05/17 1947 12/08/17 1535   12/05/17 1445  cefTRIAXone (ROCEPHIN) 1 g in sodium chloride 0.9 % 100 mL IVPB     1 g 200 mL/hr over 30 Minutes Intravenous  Once 12/05/17 1441 12/05/17 1622        Subjective:   Ivyana Locey was seen and  examined today.  At the time of my examination this morning, alert, awake and responded in monosyllables to her son.  She smiled a little at me.  Appeared comfortable.  Per her son, had a restful night.  No acute issues currently.  Objective:   Vitals:   12/26/17 0849 12/26/17 0918 12/26/17 1000 12/26/17 1200  BP: 116/71  134/77 (!) 149/65  Pulse: 93     Resp:   17 (!) 23  Temp:      TempSrc:      SpO2:  100% 100% 95%  Weight:      Height:        Intake/Output Summary (Last 24 hours) at 12/26/2017 1318 Last data filed at 12/26/2017 1000 Gross per 24 hour  Intake 588.68 ml  Output 225 ml  Net 363.68 ml     Wt Readings from Last 3 Encounters:  12/25/17 56.2 kg  11/24/17 52.9 kg  10/09/17 59.6 kg    Physical Exam  General: Alert and awake, resting  Eyes:   HEENT:  Atraumatic, normocephalic  Cardiovascular: S1 S2 auscultated, RRR, no pedal edema b/l  Respiratory: Wheezing improving  Gastrointestinal: Soft, nontender, nondistended, + bowel sounds  Ext: no pedal edema bilaterally, both lower extremity in heel in protectors  Neuro: does not follow commands  Musculoskeletal: No digital cyanosis, clubbing  Skin: Stage II sacral ulcer  Psych: does not follow commands, but appears to be responding better today.       Data Reviewed:  I have personally reviewed following labs and imaging studies  Micro Results Recent Results (from the past 240 hour(s))  Culture, blood (routine x 2)     Status: None (Preliminary result)   Collection Time: 12/22/17 12:31 PM  Result Value Ref Range Status   Specimen Description BLOOD LEFT ANTECUBITAL  Final   Special Requests   Final    BOTTLES DRAWN AEROBIC AND ANAEROBIC Blood Culture adequate volume   Culture   Final    NO GROWTH 4 DAYS Performed  at [facility], [place] [address], [place], [place] [address]    Report Status PENDING  Incomplete  Culture, blood (routine x 2)     Status: None (Preliminary result)    Collection Time: 12/22/17 12:31 PM  Result Value Ref Range Status   Specimen Description BLOOD BLOOD LEFT HAND  Final   Special Requests   Final    BOTTLES DRAWN AEROBIC AND ANAEROBIC Blood Culture results may not be optimal due to an inadequate volume of blood received in culture bottles   Culture   Final    NO GROWTH 4 DAYS Performed at [facility], [place] [address], [place], [place] [address]    Report Status PENDING  Incomplete  Urine Culture     Status: Abnormal   Collection Time: 12/23/17  1:24 PM  Result Value Ref Range Status   Specimen Description URINE, RANDOM  Final   Special Requests   Final    NONE Performed at [facility], [place] [address] [place], [place] [address]    Culture >=100,000 COLONIES/mL ENTEROCOCCUS FAECALIS (A)  Final   Report Status 12/26/2017 FINAL  Final   Organism ID, Bacteria ENTEROCOCCUS FAECALIS (A)  Final      Susceptibility   Enterococcus faecalis - MIC*    AMPICILLIN <=2 SENSITIVE Sensitive     LEVOFLOXACIN 1 SENSITIVE Sensitive     NITROFURANTOIN <=16 SENSITIVE Sensitive     VANCOMYCIN 1 SENSITIVE Sensitive     * >=100,000 COLONIES/mL ENTEROCOCCUS FAECALIS    Radiology Reports Ct Head Wo Contrast  Result Date: 12/05/2017 CLINICAL DATA:  Altered level of consciousness. EXAM: CT HEAD WITHOUT CONTRAST TECHNIQUE: Contiguous axial images were obtained from the base of the skull through the vertex without intravenous contrast. COMPARISON:  CT scan of September 20, 2016. FINDINGS: Brain: Mild chronic ischemic white matter disease is noted. No mass effect or midline shift is noted. Ventricular size is within normal limits. There is no evidence of mass lesion, hemorrhage or acute infarction. Vascular: No hyperdense vessel or unexpected calcification. Skull: Normal. Negative for fracture or focal lesion. Sinuses/Orbits: No acute finding. Other: None. IMPRESSION: Mild chronic ischemic white matter disease. No acute intracranial abnormality seen.  Electronically Signed   By: Marijo Conception, M.D.   On: 12/05/2017 12:03   Mr Brain Wo Contrast (neuro Protocol)  Result Date: 12/05/2017 CLINICAL DATA:  84 year old female with altered mental status. EXAM: MRI HEAD WITHOUT CONTRAST TECHNIQUE: Multiplanar, multiecho pulse sequences of the brain and surrounding structures were obtained without intravenous contrast. COMPARISON:  Head CT without contrast 1140 hours today. Brain MRI 09/20/2016. FINDINGS: Brain: No restricted diffusion to suggest acute infarction. No midline shift, mass effect, evidence of mass lesion, ventriculomegaly, extra-axial collection or acute intracranial hemorrhage. Cervicomedullary junction and pituitary are within normal limits. Chronic lacunar infarcts in the brainstem and bilateral deep gray matter nuclei. Chronic confluent bilateral cerebral white matter T2 and FLAIR hyperintensity. Chronic small area of cortical encephalomalacia at the right motor strip. Chronic microhemorrhage in the lateral right thalamus. No new signal abnormality.  Stable cerebral volume since 2018. Vascular: Major intracranial vascular flow voids are stable since 2018. Skull and upper cervical spine: Negative visible cervical spine. Visualized bone marrow signal is within normal limits. Sinuses/Orbits: Stable and negative. Other: Mastoids remain clear. Visible internal auditory structures appear normal. Scalp and face soft tissues appear negative. IMPRESSION: 1.  No acute intracranial abnormality. 2. Advanced chronic ischemic disease appears stable since 2018. Electronically Signed  By: Genevie Ann M.D.   On: 12/05/2017 17:00   Ir Gastrostomy Tube Mod Sed  Result Date: 12/10/2017 INDICATION: 84 year old female with achalasia and inability to eat. She presents for percutaneous gastrostomy tube placement. EXAM: Fluoroscopically guided placement of percutaneous pull-through gastrostomy tube Interventional Radiologist:  Criselda Peaches, MD MEDICATIONS: 2 g  Ancef; Antibiotics were administered within 1 hour of the procedure. Additionally, 1 mg glucagon was administered prior to the procedure. ANESTHESIA/SEDATION: Versed 1.5 mg IV; Fentanyl 50 mcg IV Moderate Sedation Time:  14 minutes The patient was continuously monitored during the procedure by the interventional radiology nurse under my direct supervision. CONTRAST:  15 mL Isovue 370 injected into the gastric lumen. FLUOROSCOPY TIME:  Fluoroscopy Time: 2 minutes 48 seconds (4 mGy). COMPLICATIONS: None immediate. PROCEDURE: Informed written consent was obtained from the patient after a thorough discussion of the procedural risks, benefits and alternatives. All questions were addressed. Maximal Sterile Barrier Technique was utilized including caps, mask, sterile gowns, sterile gloves, sterile drape, hand hygiene and skin antiseptic. A timeout was performed prior to the initiation of the procedure. Maximal barrier sterile technique utilized including caps, mask, sterile gowns, sterile gloves, large sterile drape, hand hygiene, and chlorhexadine skin prep. An angled catheter was advanced over a wire under fluoroscopic guidance through the nose, down the esophagus and into the body of the stomach. The stomach was then insufflated with several 100 ml of air. Fluoroscopy confirmed location of the gastric bubble, as well as inferior displacement of the barium stained colon. Under direct fluoroscopic guidance, a single T-tack was placed, and the anterior gastric wall drawn up against the anterior abdominal wall. Percutaneous access was then obtained into the mid gastric body with an 18 gauge sheath needle. Aspiration of air, and injection of contrast material under fluoroscopy confirmed needle placement. An Amplatz wire was advanced in the gastric body and the access needle exchanged for a 9-French vascular sheath. A snare device was advanced through the vascular sheath and an Amplatz wire advanced through the angled  catheter. The Amplatz wire was successfully snared and this was pulled up through the esophagus and out the mouth. A 20-French Alinda Dooms MIC-PEG tube was then connected to the snare and pulled through the mouth, down the esophagus, into the stomach and out to the anterior abdominal wall. Hand injection of contrast material confirmed intragastric location. The T-tack retention suture was then cut. The pull through peg tube was then secured with the external bumper and capped. The patient will be observed for several hours with the newly placed tube on low wall suction to evaluate for any post procedure complication. The patient tolerated the procedure well, there is no immediate complication. IMPRESSION: Successful placement of a 20 French pull through gastrostomy tube. Electronically Signed   By: Jacqulynn Cadet M.D.   On: 12/10/2017 09:29   US Abdomen Limited  Result Date: 12/19/2017 CLINICAL DATA:  Periumbilical nodule. EXAM: ULTRASOUND ABDOMEN LIMITED COMPARISON:  CT scan of November 04, 2017. FINDINGS: Limited sonographic evaluation of the periumbilical region demonstrates moderate size hernia. There is solid density within the hernia which may represent bowel loops, although no peristalsis is observed. The possibility of this representing enlarged lymph node or other neoplasm cannot be excluded. IMPRESSION: Moderate size periumbilical hernia is noted. Solid density is noted within the hernia which may represent bowel loop, but enlarged lymph node or neoplasm cannot be excluded. Clinical correlation is recommended. Electronically Signed   By: Marijo Conception, M.D.  On: 12/19/2017 12:44   Dg Chest Port 1 View  Result Date: 12/25/2017 CLINICAL DATA:  Sudden onset of dyspnea this morning EXAM: PORTABLE CHEST 1 VIEW COMPARISON:  Portable exam 1156 hours compared to 12/23/2017 FINDINGS: RIGHT arm PICC line tip projects over SVC. Enlargement of cardiac silhouette post CABG. Atherosclerotic  calcification aorta. Pulmonary vascular congestion with diffuse interstitial infiltrates consistent with pulmonary edema, little changed from the previous exam. Probable small LEFT pleural effusion. No pneumothorax. Bones demineralized. IMPRESSION: Persistent CHF, little changed. Electronically Signed   By: Lavonia Dana M.D.   On: 12/25/2017 12:18   Dg Chest Port 1 View  Result Date: 12/23/2017 CLINICAL DATA:  Hypoxia. EXAM: PORTABLE CHEST 1 VIEW COMPARISON:  12/23/2017 FINDINGS: Postoperative changes in the mediastinum. Right PICC line with tip over the low SVC region. No pneumothorax. Cardiac enlargement with diffuse pulmonary vascular congestion. Diffuse interstitial pattern and perihilar alveolar pattern to the lungs suggesting pulmonary edema. Probable small bilateral pleural effusions. Calcification of the aorta. Gastrostomy tube. Degenerative changes in the spine. Old fracture deformity of the right proximal humerus. IMPRESSION: Cardiac enlargement with pulmonary vascular congestion and pulmonary edema. Small bilateral pleural effusions. Electronically Signed   By: Lucienne Capers M.D.   On: 12/23/2017 22:12   Dg Chest Port 1 View  Result Date: 12/23/2017 CLINICAL DATA:  Congestive heart failure EXAM: PORTABLE CHEST 1 VIEW COMPARISON:  Chest radiograph 12/20/2017 FINDINGS: Monitoring leads overlie the patient. Right upper extremity PICC line tip projects over the superior vena cava. Patient status post median sternotomy. Stable cardiac and mediastinal contours. Small bilateral layering pleural effusions. Bilateral mid lower lung heterogeneous opacities. IMPRESSION: Cardiomegaly. Persistent interstitial edema with small bilateral pleural effusions. Electronically Signed   By: Lovey Newcomer M.D.   On: 12/23/2017 11:24   Dg Chest Port 1 View  Result Date: 12/20/2017 CLINICAL DATA:  Shortness of breath EXAM: PORTABLE CHEST 1 VIEW COMPARISON:  12/15/2017 FINDINGS: Postoperative changes in the  mediastinum. Right PICC line with tip over the low SVC region. No pneumothorax. Cardiac enlargement with pulmonary vascular congestion. Developing interstitial changes in the lungs likely representing edema. No blunting of costophrenic angles. Calcification of the aorta. Degenerative changes in the spine and shoulders. Fracture of the proximal right humerus as seen previously. IMPRESSION: Cardiac enlargement with pulmonary vascular congestion and developing interstitial edema. Electronically Signed   By: Lucienne Capers M.D.   On: 12/20/2017 22:20   Dg Chest Port 1 View  Result Date: 12/15/2017 CLINICAL DATA:  Decreased breath sounds at left lung base,hx htn,dm,cad,cardiomyopathy EXAM: PORTABLE CHEST 1 VIEW COMPARISON:  Chest x-rays dated 12/05/2017, 10/06/2016 and 05/14/2010. FINDINGS: Heart size and mediastinal contours are stable. New RIGHT-sided PICC line in place with tip at the level of the lower SVC/cavoatrial junction. Lungs appear clear. No pleural effusion or pneumothorax seen. Median sternotomy wires appear intact and stable in alignment. No acute or suspicious osseous finding. IMPRESSION: No active disease. No evidence of pneumonia or pulmonary edema. RIGHT-sided PICC line appears well positioned with tip at the level of the lower SVC/cavoatrial junction. Electronically Signed   By: Franki Cabot M.D.   On: 12/15/2017 11:10   Dg Chest Port 1 View  Result Date: 12/05/2017 CLINICAL DATA:  Altered mental status, difficulty with speech EXAM: PORTABLE CHEST 1 VIEW COMPARISON:  Portable chest x-ray of 11/09/2016 FINDINGS: No active infiltrate or effusion is seen. Mediastinal and hilar contours are unremarkable. Mild cardiomegaly is stable as is the ectatic descending thoracic aorta. Posttraumatic degenerative change of the  right humeral neck is again noted. IMPRESSION: 1. No active lung disease. 2. Stable mild cardiomegaly. Electronically Signed   By: Ivar Drape M.D.   On: 12/05/2017 10:00   [place]  Ekg Site Rite  Result Date: 12/09/2017 If Site Rite image not attached, placement could not be confirmed due to current cardiac rhythm.   Lab Data:  CBC: Recent Labs  Lab 12/22/17 1219 12/23/17 0501 12/24/17 0428 12/25/17 0435 12/26/17 0519  WBC 15.1* 14.9* 14.4* 12.5* 16.1*  HGB 7.9* 7.3* 9.6* 9.0* 9.4*  HCT 25.9* 23.6* 30.3* 28.6* 30.9*  MCV 90.9 91.1 89.1 90.5 92.5  PLT 337 334 302 294 098   Basic Metabolic Panel: Recent Labs  Lab 12/22/17 0524 12/23/17 0501 12/24/17 0428 12/25/17 1331 12/26/17 0519  NA 135 130* 135 136 137  K 4.3 4.5 4.5 5.0 5.0  CL 101 95* 97* 99 99  CO2 26 27 28 29 28   GLUCOSE 187* 264* 128* 216* 205*  BUN 30* 34* 40* 56* 63*  CREATININE 1.52* 1.54* 1.77* 2.28* 2.52*  CALCIUM 8.8* 8.9 9.1 9.5 9.4  MG  --   --   --  2.0  --    GFR: Estimated Creatinine Clearance: 12.8 mL/min (A) (by C-G formula based on SCr of 2.52 mg/dL (H)). Liver Function Tests: Recent Labs  Lab 12/19/17 1737 12/20/17 1146  AST  --  27  ALT  --  21  ALKPHOS  --  54  BILITOT 0.5 0.5  PROT  --  5.3*  ALBUMIN  --  1.4*   No results for input(s): LIPASE, AMYLASE in the last 168 hours. Recent Labs  Lab 12/20/17 1146  AMMONIA 11   Coagulation Profile: No results for input(s): INR, PROTIME in the last 168 hours. Cardiac Enzymes: Recent Labs  Lab 12/22/17 0524 12/23/17 2200 12/24/17 0428 12/24/17 1015 12/24/17 1646  TROPONINI 5.12* 2.13* 3.17* 3.29* 3.08*   BNP (last 3 results) No results for input(s): PROBNP in the last 8760 hours. HbA1C: Recent Labs    12/24/17 1313  HGBA1C 5.9*   CBG: Recent Labs  Lab 12/25/17 2031 12/26/17 0150 12/26/17 0426 12/26/17 0731 12/26/17 1230  GLUCAP 204* 231* 209* 202* 120*   Lipid Profile: No results for input(s): CHOL, HDL, LDLCALC, TRIG, CHOLHDL, LDLDIRECT in the last 72 hours. Thyroid Function Tests: No results for input(s): TSH, T4TOTAL, FREET4, T3FREE, THYROIDAB in the last 72 hours. Anemia Panel: No  results for input(s): VITAMINB12, FOLATE, FERRITIN, TIBC, IRON, RETICCTPCT in the last 72 hours. Urine analysis:    Component Value Date/Time   COLORURINE YELLOW 12/23/2017 1324   APPEARANCEUR CLOUDY (A) 12/23/2017 1324   LABSPEC 1.010 12/23/2017 1324   PHURINE 7.0 12/23/2017 1324   GLUCOSEU NEGATIVE 12/23/2017 1324   HGBUR MODERATE (A) 12/23/2017 1324   BILIRUBINUR NEGATIVE 12/23/2017 1324   KETONESUR NEGATIVE 12/23/2017 1324   PROTEINUR 100 (A) 12/23/2017 1324   UROBILINOGEN 0.2 06/30/2011 1214   NITRITE NEGATIVE 12/23/2017 1324   LEUKOCYTESUR LARGE (A) 12/23/2017 1324     Oshea Percival M.D. [facility] 12/26/2017, 1:18 PM  Pager: [phone] Between 7am to 7pm - call Pager - [phone]  After 7pm go to [email or web address] - password [email or web address]  Call night coverage person covering after 7pm

## 2017-12-26 NOTE — Progress Notes (Signed)
Cardiology at bedside to speak with patients son at this time.

## 2017-12-26 NOTE — Progress Notes (Addendum)
Inpatient Diabetes Program Recommendations  AACE/ADA: New Consensus Statement on Inpatient Glycemic Control (2015)  Target Ranges:  Prepandial:   less than 140 mg/dL      Peak postprandial:   less than 180 mg/dL (1-2 hours)      Critically ill patients:  140 - 180 mg/dL   Lab Results  Component Value Date   GLUCAP 202 (H) 12/26/2017   HGBA1C 5.9 (H) 12/24/2017    Review of Glycemic Control Results for [name], [name] (MRN [id number]) as of 12/26/2017 11:07  Ref. Range 12/25/2017 16:25 12/25/2017 20:31 12/26/2017 01:50 12/26/2017 04:26 12/26/2017 07:31  Glucose-Capillary Latest Ref Range: 70 - 99 mg/dL 181 (H) 204 (H) 231 (H) 209 (H) 202 (H)  Diabetes history:Type 2 DM Outpatient Diabetes medications: Glipizide 10 mg QAM, Trulicity 1.5 mg Q Sunday Current orders for Inpatient glycemic control:Lantus 20 units qhs,Novolog 0-15 units Q4H Jevity 55 cc/ hr Inpatient Diabetes Program Recommendations:    Blood sugars > goal.  May consider d/c of Lantus and add Levemir 12 units bid (By splitting basal dose, may be easier to adjust doses and hold if feeds held to prevent hypoglycemia).  Thanks,  Adah Perl, RN, BC-ADM Inpatient Diabetes Coordinator Pager [phone] (8a-5p)

## 2017-12-26 NOTE — Progress Notes (Signed)
AC and unit Director at bedside at this time speaking with family

## 2017-12-26 NOTE — Progress Notes (Signed)
OT Cancellation Note  Patient Details Name: [name] MRN: [id number] DOB: [birth year]   Cancelled Treatment:    Reason Eval/Treat Not Completed: Medical issues which prohibited therapy.  RN reports pt agitated and just received Morphine.  She requests OT hold off and check back.  Will check back as schedule allows.  Lucille Passy, OTR/L Acute Rehabilitation Services Pager [phone] Office [phone]   Lucille Passy M 12/26/2017, 10:18 AM

## 2017-12-27 DIAGNOSIS — N179 Acute kidney failure, unspecified: Principal | ICD-10-CM

## 2017-12-27 LAB — BASIC METABOLIC PANEL
ANION GAP: 8 (ref 5–15)
BUN: 70 mg/dL — ABNORMAL HIGH (ref 8–23)
CHLORIDE: 100 mmol/L (ref 98–111)
CO2: 29 mmol/L (ref 22–32)
CREATININE: 2.64 mg/dL — AB (ref 0.44–1.00)
Calcium: 8.8 mg/dL — ABNORMAL LOW (ref 8.9–10.3)
GFR calc non Af Amer: 16 mL/min — ABNORMAL LOW (ref >60)
GFR, EST AFRICAN AMERICAN: 18 mL/min — AB (ref >60)
Glucose, Bld: 180 mg/dL — ABNORMAL HIGH (ref 70–99)
POTASSIUM: 5.7 mmol/L — AB (ref 3.5–5.1)
SODIUM: 137 mmol/L (ref 135–145)

## 2017-12-27 LAB — GLUCOSE, CAPILLARY
Glucose-Capillary: 146 mg/dL — ABNORMAL HIGH (ref 70–99)
Glucose-Capillary: 158 mg/dL — ABNORMAL HIGH (ref 70–99)
Glucose-Capillary: 161 mg/dL — ABNORMAL HIGH (ref 70–99)
Glucose-Capillary: 176 mg/dL — ABNORMAL HIGH (ref 70–99)
Glucose-Capillary: 183 mg/dL — ABNORMAL HIGH (ref 70–99)
Glucose-Capillary: 201 mg/dL — ABNORMAL HIGH (ref 70–99)
Glucose-Capillary: 204 mg/dL — ABNORMAL HIGH (ref 70–99)
Glucose-Capillary: 209 mg/dL — ABNORMAL HIGH (ref 70–99)
Glucose-Capillary: 209 mg/dL — ABNORMAL HIGH (ref 70–99)

## 2017-12-27 LAB — ALBUMIN: Albumin: 1.7 g/dL — ABNORMAL LOW (ref 3.5–5.0)

## 2017-12-27 LAB — CULTURE, BLOOD (ROUTINE X 2)
CULTURE: NO GROWTH
Culture: NO GROWTH
SPECIAL REQUESTS: ADEQUATE

## 2017-12-27 LAB — CBC
HEMATOCRIT: 27.7 % — AB (ref 36.0–46.0)
HEMOGLOBIN: 8.7 g/dL — AB (ref 12.0–15.0)
MCH: 29 pg (ref 26.0–34.0)
MCHC: 31.4 g/dL (ref 30.0–36.0)
MCV: 92.3 fL (ref 80.0–100.0)
NRBC: 0 % (ref 0.0–0.2)
Platelets: 285 10*3/uL (ref 150–400)
RBC: 3 MIL/uL — AB (ref 3.87–5.11)
RDW: 16.7 % — AB (ref 11.5–15.5)
WBC: 13 10*3/uL — AB (ref 4.0–10.5)

## 2017-12-27 MED ORDER — SODIUM POLYSTYRENE SULFONATE 15 GM/60ML PO SUSP
15.0000 g | Freq: Once | ORAL | Status: AC
  Administered 2017-12-27: 15 g
  Filled 2017-12-27 (×2): qty 60

## 2017-12-27 MED ORDER — MORPHINE SULFATE (PF) 2 MG/ML IV SOLN: 2.0000 mg | INTRAVENOUS | Status: DC | PRN

## 2017-12-27 NOTE — Progress Notes (Signed)
PROGRESS NOTE  [name] ZOX:[id number] DOB: 04-03-1927 DOA: 12/05/2017 PCP: Deland Pretty, MD   LOS: 18 days   Brief Narrative / Interim history: 84 year old female history of diabetes type 2, hypertension, hyperlipidemia, coronary artery disease with history of CABG, prior CVA, chronic kidney disease stage III admitted to the hospital on 12/05/2017 with dysarthria.  MRI did not show any strokes.  Hospital course complicated by failure to thrive, inability to eat status post PEG tube placement.  Subjective: -She was initially minimally responsive so she just received morphine, however later during the interview she wakes up more, and is intermittently agitated.  She does not answer any my questions.  Son is at bedside  Assessment & Plan: Principal Problem:   Dysarthria Active Problems:   Diabetes mellitus ([place])   Hyperlipidemia   Essential hypertension   Coronary atherosclerosis   Stroke (cerebrum) ([place])   Stage 3 chronic kidney disease ([place])   Acute lower UTI   Adult failure to thrive   UTI (urinary tract infection)   DNR (do not resuscitate) discussion   Palliative care by specialist   CHF (congestive heart failure) ([place])   Aspiration pneumonia due to gastric secretions ([place])   NSTEMI (non-ST elevated myocardial infarction) ([place])   Acute respiratory failure with hypoxia secondary to pulmonary edema, acute combined systolic and diastolic CHF -On 81/19, patient had rapid response with acute pulmonary edema, likely due to volume overload, BNP elevated, placed on IV Lasix.  Patient was found to be 27 L positive, had been receiving tube feeds, free water and IV fluids during hospitalization. Repeat echo showed EF of 30% with grade 2 diastolic dysfunction, troponins trended up to 12.1 -Prolonged hospitalization with episodes of agitation, respiratory distress, aspiration.  Patient had acute respiratory distress on 12/25/2017.   -This morning she is somewhat sleepy, son is at bedside  and tells me that she just received morphine -Later on she woke up and became extremely agitated -Received Lasix 60 mg total 11/25, creatinine continues to trend up and it is 2.64 this morning from 2.5 yesterday, she is also hyperkalemic with a potassium of 5.7 -Lasix is now now hold -Discussed with Dr. Percival Spanish with cardiology  [place] -Intermittent, ENT consulted, discussed with Dr. Wilburn Cornelia, no significant findings on flexible laryngoscopy except for copious dried secretions in the nasopharynx, supraglottis and hypopharynx  NSTEMI possibly due to volume overload/acute HF -Troponins elevated at 8.87, trended up to 12.1, patient was placed on IV heparin drip and cardiology was consulted. -Recent 2D echo on 11/09/2017 had shown EF of 35 to 50% with grade 1 diastolic dysfunction. Repeat echo showed EF of 30% with grade 2 diastolic dysfunction, diffuse hypokinesis -She is now off the heparin drip, continue nitroglycerin drip per cardiology recommendations  Acute metabolic encephalopathy -Patient had presented with dysarthria, confusion.  After discussion with the patient's son (physician), he states that patient does not have dementia.  -MRI of the brain showed chronic advanced atherosclerosis disease, chronic lacunar infarcts however no new infarct. -UA was positive 11/5, had shown more than 100,000 colonies of Enterococcus faecalis.  Repeat urine culture on 11/13, showed no growth -LFTs, ammonia level normal.  Neurology evaluation done on 11/20, seen by Dr. Cheral Marker, likely patient has vascular dementia due to chronic infarcts and chronic atherosclerotic disease. -Mental status fluctuates  Aspiration PNA -Patient was started on IV vancomycin and Zosyn on 11/24, today is day 4/8 -Aspiration precautions.    Anemia: Acute on chronic -Baseline appears to be 7.5-8.  FOBT negative, not  far from her baseline of 7.5.  May have been hemodilution. -Epo level is slightly above normal, received 2  units packed RBC transfusion for hemoglobin of 7.3 on 11/23 given NSTEMI and demand ischemia.  Heparin drip was discontinued after 48 hours. -H&H stable  Diabetes mellitus (HCC) type II, uncontrolled -Continue sliding scale insulin to every 4 hours -Change Levemir to split dose, 10 units twice daily  -CBG is 160s-180s, maintain current regimen  Failure to thrive, status post PEG tube, dysphagia -Off TPN -High residuals on tube feeds, placed on scheduled Reglan and restart tube feeds at low-dose. -Tube feeds at 30 cc an hour, would not advance further while mental status is poor  Pressure injury -Stage II partial-thickness with shallow open ulcer, red-pink wound bed with slough, medial sacrum, present on admission -Wound care per nursing  Subcutaneous nodule; non-incarcerated umbilical hernia probably lymph node -Seen by surgery, and recommended no surgical treatment at this time  Acute kidney injury on CKD stage III -Baseline creatinine 1.3-1.4 -Creatinine trending up 2.6 this morning, it appears to have plateaued, hold further Lasix   Scheduled Meds: . sodium chloride   Intravenous Once  . alteplase  2 mg Intracatheter Once  . atorvastatin  40 mg Per Tube q1800  . Chlorhexidine Gluconate Cloth  6 each Topical Q0600  . cholecalciferol  400 Units Per Tube Daily  . clopidogrel  75 mg Per Tube Daily  . ferrous sulfate  300 mg Per Tube Daily  . free water  135 mL Per Tube Q8H  . hydrALAZINE  10 mg Oral Q8H  . insulin aspart  0-15 Units Subcutaneous Q4H  . insulin detemir  10 Units Subcutaneous BID  . levalbuterol  0.63 mg Nebulization TID   And  . ipratropium  0.5 mg Nebulization TID  . methylPREDNISolone (SOLU-MEDROL) injection  40 mg Intravenous Q12H  . metoCLOPramide (REGLAN) injection  5 mg Intravenous Q6H  . metoprolol tartrate  75 mg Oral BID  . mirtazapine  7.5 mg Per Tube QHS  . multivitamin  15 mL Per Tube Daily  . mupirocin ointment  1 application Nasal BID  .  pantoprazole sodium  40 mg Per Tube BID  . sennosides  5 mL Per Tube QHS   Continuous Infusions: . feeding supplement (JEVITY 1.2 CAL) 1,000 mL (12/27/17 0626)  . nitroGLYCERIN 5 mcg/min (12/26/17 1500)  . piperacillin-tazobactam (ZOSYN)  IV Stopped (12/27/17 0700)  . vancomycin Stopped (12/27/17 0700)   PRN Meds:.acetaminophen, fentaNYL, hydrALAZINE, levalbuterol, loperamide HCl, morphine injection, nitroGLYCERIN, ondansetron (ZOFRAN) IV, sodium chloride flush  DVT prophylaxis: SCDs Code Status: Full code Family Communication: discussed with son at bedside Disposition Plan: To be determined  Consultants:   Cardiology   ENT  Palliative   Procedures:   2D echo:  Impressions: - Normal LV size with mild LV hypertrophy. EF 30%, diffuse hypokinesis. Moderate diastolic dysfunction. Normal RV size with mildly decreased systolic function. Mild to moderate MR.   Antimicrobials:  Vancomycin / Zosyn 11/24 >>   Objective: Vitals:   12/27/17 0400 12/27/17 0500 12/27/17 0752 12/27/17 1238  BP: 121/79 111/69 107/71 (!) 142/75  Pulse: (!) 101     Resp: 10 (!) 9 18 19   Temp: 97.8 F (36.6 C)  98.3 F (36.8 C) (!) 97.5 F (36.4 C)  TempSrc: Axillary  Axillary Axillary  SpO2: 98% 100% 100% 100%  Weight:  61.2 kg    Height:        Intake/Output Summary (Last 24 hours) at 12/27/2017 1424  Last data filed at 12/27/2017 0900 Gross per 24 hour  Intake 322.19 ml  Output 1125 ml  Net -802.81 ml   Filed Weights   12/24/17 0900 12/25/17 0619 12/27/17 0500  Weight: 64 kg 56.2 kg 61.2 kg    Examination:  Constitutional: Lethargic, confused Eyes: No scleral icterus ENMT: Mucous membranes are moist.  Neck: normal, supple Respiratory: Lungs mostly clear on anterior auscultation without wheezing or crackles.  Some upper airway stridor noted today.  Shallow respiratory effort Cardiovascular: Regular rate and rhythm, no murmurs / rubs / gallops. Trace LE edema. 2+ pedal pulses.    Abdomen: no tenderness. Bowel sounds positive.  PEG tube in place Skin: No rashes appreciated Neurologic: Moves all 4 extremities spontaneously, does not follow commands   Data Reviewed: I have independently reviewed following labs and imaging studies   CBC: Recent Labs  Lab 12/23/17 0501 12/24/17 0428 12/25/17 0435 12/26/17 0519 12/27/17 0400  WBC 14.9* 14.4* 12.5* 16.1* 13.0*  HGB 7.3* 9.6* 9.0* 9.4* 8.7*  HCT 23.6* 30.3* 28.6* 30.9* 27.7*  MCV 91.1 89.1 90.5 92.5 92.3  PLT 334 302 294 332 163   Basic Metabolic Panel: Recent Labs  Lab 12/23/17 0501 12/24/17 0428 12/25/17 1331 12/26/17 0519 12/27/17 0400  NA 130* 135 136 137 137  K 4.5 4.5 5.0 5.0 5.7*  CL 95* 97* 99 99 100  CO2 27 28 29 28 29   GLUCOSE 264* 128* 216* 205* 180*  BUN 34* 40* 56* 63* 70*  CREATININE 1.54* 1.77* 2.28* 2.52* 2.64*  CALCIUM 8.9 9.1 9.5 9.4 8.8*  MG  --   --  2.0  --   --    GFR: Estimated Creatinine Clearance: 12.2 mL/min (A) (by C-G formula based on SCr of 2.64 mg/dL (H)). Liver Function Tests: Recent Labs  Lab 12/27/17 0400  ALBUMIN 1.7*   No results for input(s): LIPASE, AMYLASE in the last 168 hours. No results for input(s): AMMONIA in the last 168 hours. Coagulation Profile: No results for input(s): INR, PROTIME in the last 168 hours. Cardiac Enzymes: Recent Labs  Lab 12/22/17 0524 12/23/17 2200 12/24/17 0428 12/24/17 1015 12/24/17 1646  TROPONINI 5.12* 2.13* 3.17* 3.29* 3.08*   BNP (last 3 results) No results for input(s): PROBNP in the last 8760 hours. HbA1C: No results for input(s): HGBA1C in the last 72 hours. CBG: Recent Labs  Lab 12/27/17 0328 12/27/17 0758 12/27/17 1004 12/27/17 1236 12/27/17 1404  GLUCAP 158* 146* 161* 176* 183*   Lipid Profile: No results for input(s): CHOL, HDL, LDLCALC, TRIG, CHOLHDL, LDLDIRECT in the last 72 hours. Thyroid Function Tests: No results for input(s): TSH, T4TOTAL, FREET4, T3FREE, THYROIDAB in the last 72  hours. Anemia Panel: No results for input(s): VITAMINB12, FOLATE, FERRITIN, TIBC, IRON, RETICCTPCT in the last 72 hours. Urine analysis:    Component Value Date/Time   COLORURINE YELLOW 12/23/2017 1324   APPEARANCEUR CLOUDY (A) 12/23/2017 1324   LABSPEC 1.010 12/23/2017 1324   PHURINE 7.0 12/23/2017 1324   GLUCOSEU NEGATIVE 12/23/2017 1324   HGBUR MODERATE (A) 12/23/2017 1324   BILIRUBINUR NEGATIVE 12/23/2017 1324   KETONESUR NEGATIVE 12/23/2017 1324   PROTEINUR 100 (A) 12/23/2017 1324   UROBILINOGEN 0.2 06/30/2011 1214   NITRITE NEGATIVE 12/23/2017 1324   LEUKOCYTESUR LARGE (A) 12/23/2017 1324   Sepsis Labs: Invalid input(s): PROCALCITONIN, LACTICIDVEN  Recent Results (from the past 240 hour(s))  Culture, blood (routine x 2)     Status: None   Collection Time: 12/22/17 12:31 PM  Result Value  Ref Range Status   Specimen Description BLOOD LEFT ANTECUBITAL  Final   Special Requests   Final    BOTTLES DRAWN AEROBIC AND ANAEROBIC Blood Culture adequate volume   Culture   Final    NO GROWTH 5 DAYS Performed at [facility], [address] [place], [place] [address]    Report Status 12/27/2017 FINAL  Final  Culture, blood (routine x 2)     Status: None   Collection Time: 12/22/17 12:31 PM  Result Value Ref Range Status   Specimen Description BLOOD BLOOD LEFT HAND  Final   Special Requests   Final    BOTTLES DRAWN AEROBIC AND ANAEROBIC Blood Culture results may not be optimal due to an inadequate volume of blood received in culture bottles   Culture   Final    NO GROWTH 5 DAYS Performed at [facility], [place] [address], [place], [place] [address]    Report Status 12/27/2017 FINAL  Final  Urine Culture     Status: Abnormal   Collection Time: 12/23/17  1:24 PM  Result Value Ref Range Status   Specimen Description URINE, RANDOM  Final   Special Requests   Final    NONE Performed at [facility], [place] [address] [place], [place] [address]    Culture  >=100,000 COLONIES/mL ENTEROCOCCUS FAECALIS (A)  Final   Report Status 12/26/2017 FINAL  Final   Organism ID, Bacteria ENTEROCOCCUS FAECALIS (A)  Final      Susceptibility   Enterococcus faecalis - MIC*    AMPICILLIN <=2 SENSITIVE Sensitive     LEVOFLOXACIN 1 SENSITIVE Sensitive     NITROFURANTOIN <=16 SENSITIVE Sensitive     VANCOMYCIN 1 SENSITIVE Sensitive     * >=100,000 COLONIES/mL ENTEROCOCCUS FAECALIS      Radiology Studies: No results found.  Marzetta Board, MD, PhD [facility] Pager [phone]  If 7PM-7AM, please contact night-coverage [email or web address] Password [facility] 12/27/2017, 2:24 PM

## 2017-12-27 NOTE — Consult Note (Signed)
ENT CONSULT:  Reason for Consult: Intermittent stridor and cough Referring Physician: Hospitalist service  [name] is an 84 y.o. female.  HPI: Patient admitted to [facility] mental status changes and confusion.  On admission she was found to have acute respiratory failure with pulmonary edema.  Acute encephalopathy, confusion and dysarthria.  ENT service consulted for evaluation of intermittent respiratory symptoms.  Question whether patient has stridor.  Currently aphonic with copious secretions and cough.  She has a gastrostomy tube for feeding and is n.p.o.  Treated for acute respiratory issues and pulmonary dysfunction.  Past Medical History:  Diagnosis Date  . Acute renal injury ([place]) 07/2016  . Anginal pain ([place]) 2004; 2011  . CAD (coronary artery disease)    status post stenting of the RCA status post CABG. (left internal mammary artery to  LAD, saphenous vein graft to second diagonal, saphenous vein  graft to obtuse marginal 1, saphenous vein graft to posterior  descending).  . Cardiomyopathy     Mildly reduced EF (45%).   . Chronic kidney disease (CKD), stage III (moderate) ([place])   . History of blood transfusion 1960s   "related to anemia"  . HTN (hypertension)   . Pleural effusion 2011   S/P "bypass"  . Shortness of breath   . Stroke ([place]) 09/19/2016   "left sided weakness" (09/20/2016)  . Type II diabetes mellitus ([place])     Past Surgical History:  Procedure Laterality Date  . BOTOX INJECTION  11/20/2017   Procedure: BOTOX INJECTION;  Surgeon: Doran Stabler, MD;  Location: [facility];  Service: Gastroenterology;;  . CATARACT EXTRACTION W/ INTRAOCULAR LENS  IMPLANT, BILATERAL Bilateral   . CORONARY ANGIOPLASTY WITH STENT PLACEMENT  2004   "LAD & one of the circumflex"  . CORONARY ARTERY BYPASS GRAFT  2011   CABG X4  . ESOPHAGOGASTRODUODENOSCOPY (EGD) WITH PROPOFOL N/A 11/20/2017   Procedure: ESOPHAGOGASTRODUODENOSCOPY (EGD) WITH PROPOFOL;  Surgeon: Doran Stabler, MD;  Location: [facility];  Service: Gastroenterology;  Laterality: N/A;  . IR GASTROSTOMY TUBE MOD SED  12/10/2017    Family History  Problem Relation Age of Onset  . Diabetes Father 5  . Unexplained death Mother 103  . CAD Neg Hx     Social History:  reports that she has never smoked. She has never used smokeless tobacco. She reports that she does not drink alcohol or use drugs.  Allergies:  Allergies  Allergen Reactions  . Contrast Media [Iodinated Diagnostic Agents]     Sensitivity, kidney issues    Medications: I have reviewed the patient's current medications.  Results for orders placed or performed during the hospital encounter of 12/05/17 (from the past 48 hour(s))  Blood gas, arterial     Status: Abnormal   Collection Time: 12/25/17 11:40 AM  Result Value Ref Range   O2 Content 3.0 L/min   Delivery systems NASAL CANNULA    pH, Arterial 7.426 7.350 - 7.450   pCO2 arterial 42.2 32.0 - 48.0 mmHg   pO2, Arterial 67.3 (L) 83.0 - 108.0 mmHg   Bicarbonate 27.2 20.0 - 28.0 mmol/L   Acid-Base Excess 3.2 (H) 0.0 - 2.0 mmol/L   O2 Saturation 93.4 %   Patient temperature 98.6    Collection site LEFT RADIAL    Drawn by [id number]    Sample type ARTERIAL DRAW    Allens test (pass/fail) PASS PASS  Glucose, capillary     Status: Abnormal   Collection Time: 12/25/17 11:55 AM  Result Value Ref Range   Glucose-Capillary 195 (H) 70 - 99 mg/dL  Basic metabolic panel     Status: Abnormal   Collection Time: 12/25/17  1:31 PM  Result Value Ref Range   Sodium 136 135 - 145 mmol/L   Potassium 5.0 3.5 - 5.1 mmol/L   Chloride 99 98 - 111 mmol/L   CO2 29 22 - 32 mmol/L   Glucose, Bld 216 (H) 70 - 99 mg/dL   BUN 56 (H) 8 - 23 mg/dL   Creatinine, Ser 2.28 (H) 0.44 - 1.00 mg/dL   Calcium 9.5 8.9 - 10.3 mg/dL   GFR calc non Af Amer 18 (L) >60 mL/min   GFR calc Af Amer 21 (L) >60 mL/min    Comment: (NOTE) The eGFR has been calculated using the CKD EPI equation. This  calculation has not been validated in all clinical situations. eGFR's persistently <60 mL/min signify possible Chronic Kidney Disease.    Anion gap 8 5 - 15    Comment: Performed at [facility] [address], [place], [place] [address]  Magnesium     Status: None   Collection Time: 12/25/17  1:31 PM  Result Value Ref Range   Magnesium 2.0 1.7 - 2.4 mg/dL    Comment: Performed at [facility] [address], [place], [place] [address]  Glucose, capillary     Status: Abnormal   Collection Time: 12/25/17  4:25 PM  Result Value Ref Range   Glucose-Capillary 181 (H) 70 - 99 mg/dL  Glucose, capillary     Status: Abnormal   Collection Time: 12/25/17  8:31 PM  Result Value Ref Range   Glucose-Capillary 204 (H) 70 - 99 mg/dL  Glucose, capillary     Status: Abnormal   Collection Time: 12/26/17  1:50 AM  Result Value Ref Range   Glucose-Capillary 231 (H) 70 - 99 mg/dL  Glucose, capillary     Status: Abnormal   Collection Time: 12/26/17  4:26 AM  Result Value Ref Range   Glucose-Capillary 209 (H) 70 - 99 mg/dL  Basic metabolic panel     Status: Abnormal   Collection Time: 12/26/17  5:19 AM  Result Value Ref Range   Sodium 137 135 - 145 mmol/L   Potassium 5.0 3.5 - 5.1 mmol/L   Chloride 99 98 - 111 mmol/L   CO2 28 22 - 32 mmol/L   Glucose, Bld 205 (H) 70 - 99 mg/dL   BUN 63 (H) 8 - 23 mg/dL   Creatinine, Ser 2.52 (H) 0.44 - 1.00 mg/dL   Calcium 9.4 8.9 - 10.3 mg/dL   GFR calc non Af Amer 16 (L) >60 mL/min   GFR calc Af Amer 19 (L) >60 mL/min    Comment: (NOTE) The eGFR has been calculated using the CKD EPI equation. This calculation has not been validated in all clinical situations. eGFR's persistently <60 mL/min signify possible Chronic Kidney Disease.    Anion gap 10 5 - 15    Comment: Performed at [facility] [address], [place], [place] [address]  CBC     Status: Abnormal   Collection Time: 12/26/17  5:19 AM  Result Value Ref Range   WBC 16.1 (H)  4.0 - 10.5 K/uL   RBC 3.34 (L) 3.87 - 5.11 MIL/uL   Hemoglobin 9.4 (L) 12.0 - 15.0 g/dL   HCT 30.9 (L) 36.0 - 46.0 %   MCV 92.5 80.0 - 100.0 fL   MCH 28.1  26.0 - 34.0 pg   MCHC 30.4 30.0 - 36.0 g/dL   RDW 17.0 (H) 11.5 - 15.5 %   Platelets 332 150 - 400 K/uL   nRBC 0.0 0.0 - 0.2 %    Comment: Performed at [facility], [place] [address] [place], [place] [address]  Glucose, capillary     Status: Abnormal   Collection Time: 12/26/17  7:31 AM  Result Value Ref Range   Glucose-Capillary 202 (H) 70 - 99 mg/dL  Glucose, capillary     Status: Abnormal   Collection Time: 12/26/17 12:30 PM  Result Value Ref Range   Glucose-Capillary 120 (H) 70 - 99 mg/dL  Glucose, capillary     Status: Abnormal   Collection Time: 12/26/17  3:59 PM  Result Value Ref Range   Glucose-Capillary 183 (H) 70 - 99 mg/dL  Glucose, capillary     Status: Abnormal   Collection Time: 12/26/17  8:25 PM  Result Value Ref Range   Glucose-Capillary 207 (H) 70 - 99 mg/dL  Glucose, capillary     Status: Abnormal   Collection Time: 12/27/17 12:08 AM  Result Value Ref Range   Glucose-Capillary 204 (H) 70 - 99 mg/dL  Glucose, capillary     Status: Abnormal   Collection Time: 12/27/17  3:28 AM  Result Value Ref Range   Glucose-Capillary 158 (H) 70 - 99 mg/dL  Basic metabolic panel     Status: Abnormal   Collection Time: 12/27/17  4:00 AM  Result Value Ref Range   Sodium 137 135 - 145 mmol/L   Potassium 5.7 (H) 3.5 - 5.1 mmol/L   Chloride 100 98 - 111 mmol/L   CO2 29 22 - 32 mmol/L   Glucose, Bld 180 (H) 70 - 99 mg/dL   BUN 70 (H) 8 - 23 mg/dL   Creatinine, Ser 2.64 (H) 0.44 - 1.00 mg/dL   Calcium 8.8 (L) 8.9 - 10.3 mg/dL   GFR calc non Af Amer 16 (L) >60 mL/min   GFR calc Af Amer 18 (L) >60 mL/min   Anion gap 8 5 - 15    Comment: Performed at [facility], [place] [address] [place], [place] [address]  CBC     Status: Abnormal   Collection Time: 12/27/17  4:00 AM  Result Value Ref Range   WBC 13.0 (H) 4.0  - 10.5 K/uL   RBC 3.00 (L) 3.87 - 5.11 MIL/uL   Hemoglobin 8.7 (L) 12.0 - 15.0 g/dL   HCT 27.7 (L) 36.0 - 46.0 %   MCV 92.3 80.0 - 100.0 fL   MCH 29.0 26.0 - 34.0 pg   MCHC 31.4 30.0 - 36.0 g/dL   RDW 16.7 (H) 11.5 - 15.5 %   Platelets 285 150 - 400 K/uL   nRBC 0.0 0.0 - 0.2 %    Comment: Performed at [facility], [place] [address] [place], [place] [address]  Albumin     Status: Abnormal   Collection Time: 12/27/17  4:00 AM  Result Value Ref Range   Albumin 1.7 (L) 3.5 - 5.0 g/dL    Comment: Performed at [facility], [place] [address], [place], [place] [address]  Glucose, capillary     Status: Abnormal   Collection Time: 12/27/17  7:58 AM  Result Value Ref Range   Glucose-Capillary 146 (H) 70 - 99 mg/dL  Glucose, capillary     Status: Abnormal   Collection Time: 12/27/17 10:04 AM  Result Value Ref  Range   Glucose-Capillary 161 (H) 70 - 99 mg/dL    Dg Chest Port 1 View  Result Date: 12/25/2017 CLINICAL DATA:  Sudden onset of dyspnea this morning EXAM: PORTABLE CHEST 1 VIEW COMPARISON:  Portable exam 1156 hours compared to 12/23/2017 FINDINGS: RIGHT arm PICC line tip projects over SVC. Enlargement of cardiac silhouette post CABG. Atherosclerotic calcification aorta. Pulmonary vascular congestion with diffuse interstitial infiltrates consistent with pulmonary edema, little changed from the previous exam. Probable small LEFT pleural effusion. No pneumothorax. Bones demineralized. IMPRESSION: Persistent CHF, little changed. Electronically Signed   By: Lavonia Dana M.D.   On: 12/25/2017 12:18    ROS:ROS 12 systems reviewed and negative except as stated in HPI   Blood pressure 107/71, pulse (!) 101, temperature 98.3 F (36.8 C), temperature source Axillary, resp. rate 18, height 5' 3"  (1.6 m), weight 61.2 kg, SpO2 100 %.  PHYSICAL EXAM: General appearance - Patient disoriented and uncooperative. Mental status - uncooperative Nose - Patent nasal passageway with dry crusted  mucus.  No evidence of active infection or polyps. Mouth - Edentulous, dry oral mucosa.  No ulcer or lesion. Neck - supple, no significant adenopathy Chest - clear to auscultation, no wheezes, mild rhonchi, symmetric air entry  Procedure Note: Flexible Laryngoscopy  Risks/benefits and possible complications were discussed in detail. Patient understands and agrees to proceed with procedure.   Procedure: 4 mm flexible laryngoscope inserted through the nasal passageway with minimal discomfort. Nasal cavity and nasopharynx patent without discharge, mass or polyp. Normal base of tongue and supraglottis, normal vocal cord mobility. No evidence of vocal cord paralysis, mass or lesion.  No evidence of airway difficulty and no evidence of stridor during examination.  The patient has copious dry secretions in the hypopharynx and supraglottic regions.  Decreased sensation of the supraglottic region. Normal hypopharynx without evidence of mass or aspiration.  Patient tolerated procedure without complication or difficulty.  Early Chars. Wilburn Cornelia, M.D.    Studies Reviewed: None  Assessment/Plan: ENT service consulted for evaluation of possible stridor.  Flexible laryngoscopy performed at bedside with excellent visualization of the larynx and supraglottic structures.  Normal vocal cord mobility, no evidence of vocal cord mass, tumor, paralysis or paresis.  No evidence of stridor or respiratory abnormality on today's examination.  The patient has copious dry secretions in the nasopharynx, supraglottis and hypopharynx.  No evidence of neurologic dysfunction but the patient has decreased laryngeal sensation on exam.  Prior to any attempted oral intake the patient would need improved mental status and further evaluation by speech therapy.  Continue current nutrition and medication intake through gastrostomy tube.  No further work-up at this time, monitor for additional concerns and reconsult as needed.  Jerrell Belfast 12/27/2017, 11:28 AM

## 2017-12-27 NOTE — Telephone Encounter (Signed)
Patient remains in the hospital.  

## 2017-12-27 NOTE — Progress Notes (Signed)
Patients family member requesting breathing treatment for patient because family member states patient "has wheezing" after assessment it is noted that patient has clear/diminished lung sounds with mild stridor with exhalation. Informed family member of the difference in lung sounds and agreed to page respiratory therapy for treatment as requested.

## 2017-12-27 NOTE — Progress Notes (Signed)
Progress Note  Patient Name: [name] Date of Encounter: 12/27/2017  Primary Cardiologist: Minus Breeding, MD   Subjective   Agitated and confused.   Inpatient Medications    Scheduled Meds: . sodium chloride   Intravenous Once  . alteplase  2 mg Intracatheter Once  . atorvastatin  40 mg Per Tube q1800  . Chlorhexidine Gluconate Cloth  6 each Topical Q0600  . cholecalciferol  400 Units Per Tube Daily  . clopidogrel  75 mg Per Tube Daily  . ferrous sulfate  300 mg Per Tube Daily  . free water  135 mL Per Tube Q8H  . hydrALAZINE  10 mg Oral Q8H  . insulin aspart  0-15 Units Subcutaneous Q4H  . insulin detemir  10 Units Subcutaneous BID  . levalbuterol  0.63 mg Nebulization TID   And  . ipratropium  0.5 mg Nebulization TID  . methylPREDNISolone (SOLU-MEDROL) injection  40 mg Intravenous Q12H  . metoCLOPramide (REGLAN) injection  5 mg Intravenous Q6H  . metoprolol tartrate  75 mg Oral BID  . mirtazapine  7.5 mg Per Tube QHS  . multivitamin  15 mL Per Tube Daily  . mupirocin ointment  1 application Nasal BID  . pantoprazole sodium  40 mg Per Tube BID  . sennosides  5 mL Per Tube QHS   Continuous Infusions: . feeding supplement (JEVITY 1.2 CAL) 1,000 mL (12/27/17 0626)  . nitroGLYCERIN 5 mcg/min (12/26/17 1500)  . piperacillin-tazobactam (ZOSYN)  IV 2.25 g (12/27/17 0528)  . vancomycin Stopped (12/27/17 0700)   PRN Meds: acetaminophen, fentaNYL, hydrALAZINE, levalbuterol, loperamide HCl, morphine injection, nitroGLYCERIN, ondansetron (ZOFRAN) IV, sodium chloride flush   Vital Signs    Vitals:   12/27/17 0400 12/27/17 0500 12/27/17 0752 12/27/17 1238  BP: 121/79 111/69 107/71 (!) 142/75  Pulse: (!) 101     Resp: 10 (!) 9 18 19   Temp: 97.8 F (36.6 C)  98.3 F (36.8 C) (!) 97.5 F (36.4 C)  TempSrc: Axillary  Axillary Axillary  SpO2: 98% 100% 100% 100%  Weight:  61.2 kg    Height:        Intake/Output Summary (Last 24 hours) at 12/27/2017 1251 Last  data filed at 12/27/2017 0900 Gross per 24 hour  Intake 322.19 ml  Output 1125 ml  Net -802.81 ml   Filed Weights   12/24/17 0900 12/25/17 0619 12/27/17 0500  Weight: 64 kg 56.2 kg 61.2 kg    Telemetry    NSR, sinus tach - Personally Reviewed  ECG    NA - Personally Reviewed  Physical Exam   GEN:  No acute distress.   Cardiac: RRR, no  murmurs, rubs, or gallops.   Difficult exam Respiratory:   Upper airway wheezing GI: Soft, nontender, non-distended  MS:  Mild edema; No deformity. Neuro:  Confused    Labs    Chemistry Recent Labs  Lab 12/25/17 1331 12/26/17 0519 12/27/17 0400  NA 136 137 137  K 5.0 5.0 5.7*  CL 99 99 100  CO2 29 28 29   GLUCOSE 216* 205* 180*  BUN 56* 63* 70*  CREATININE 2.28* 2.52* 2.64*  CALCIUM 9.5 9.4 8.8*  ALBUMIN  --   --  1.7*  GFRNONAA 18* 16* 16*  GFRAA 21* 19* 18*  ANIONGAP 8 10 8      Hematology Recent Labs  Lab 12/25/17 0435 12/26/17 0519 12/27/17 0400  WBC 12.5* 16.1* 13.0*  RBC 3.16* 3.34* 3.00*  HGB 9.0* 9.4* 8.7*  HCT 28.6* 30.9* 27.7*  MCV 90.5 92.5 92.3  MCH 28.5 28.1 29.0  MCHC 31.5 30.4 31.4  RDW 17.0* 17.0* 16.7*  PLT 294 332 285    Cardiac Enzymes Recent Labs  Lab 12/23/17 2200 12/24/17 0428 12/24/17 1015 12/24/17 1646  TROPONINI 2.13* 3.17* 3.29* 3.08*   No results for input(s): TROPIPOC in the last 168 hours.   BNP Recent Labs  Lab 12/20/17 1450 12/23/17 2200  BNP >4,500.0* >4,500.0*     DDimer No results for input(s): DDIMER in the last 168 hours.   Radiology    No results found.  Cardiac Studies   Echo11/21/2019 Left ventricle: The cavity size was normal. Wall thickness was increased in a pattern of mild LVH. The estimated ejection fraction was 30%. Diffuse hypokinesis. Features are consistent with a pseudonormal left ventricular filling pattern, with concomitant abnormal relaxation and increased filling pressure (grade 2 diastolic dysfunction). - Aortic valve:  There was no stenosis. - Mitral valve: There was mild to moderate regurgitation. - Left atrium: The atrium was mildly dilated. - Right ventricle: The cavity size was normal. Systolic function was mildly reduced. - Pulmonary arteries: No complete TR doppler jet so unable to estimate PA systolic pressure. - Inferior vena cava: The vessel was normal in size. The respirophasic diameter changes were in the normal range (>= 50%), consistent with normal central venous pressure.   Patient Profile     84 y.o. female with CAD s/p CABG, recent ischemic stroke, HTN, DM, HLP, recent failure to thrive due to swallowing problems s/p botox injection for achalasia, developed acute pulmonary edema and NSTEMI on 12/20/2017, for which cardiology is following  Assessment & Plan    ACUTE SYSTOLIC HF:    She remains on IV NTG.  Her son has been concerned and she has wanted this to continue.    I was able to start VT hydralazine and she has tolerated this for afterload reduction with nitrates.  I would like to change the nitrates to VT in the AM if possible.  She is still oxygenating well.  She does have some left sided failure likely.  However, I think that she has overall low intravascular volume with her malnutrition and probable third spacing.  She has not tolerated diuresis probably secondary to this and it is difficult to estimate CVP and she is not a candidate for invasive monitoring.  I am not at all convinced that inotropic agents would substantially positively affect this situation as AMS and malnutrition would remain significant problems.  For today continue current therapy.  I discussed this with the primary team.     Signed, Minus Breeding, MD  12/27/2017, 12:51 PM

## 2017-12-27 NOTE — Progress Notes (Signed)
Once treatment was started patient began swinging arms and attempting to grab at mask. When pt was agitated she had some upper airway wheezing. Patient was almost done with nebulizer when patient knocked mask off her face spilling what was left of treatment. Patient has calmed down now. Vitals stable. SPO2 100% throughout on 2 LPM.

## 2017-12-27 NOTE — Progress Notes (Signed)
OT Cancellation Note  Patient Details Name: [name] MRN: [id number] DOB: December 18, 1927  RN reported pt not currently appropriate for therapy. OT will continue to follow as ordered.  Cancelled Treatment:    Reason Eval/Treat Not Completed: Medical issues which prohibited therapy  Curtis Sites, OTR/L 12/27/2017, 2:50 PM

## 2017-12-27 NOTE — Progress Notes (Addendum)
RN paged MD for pt becoming more agitated and moaning more frequently. Awaiting call back from MD. Will continue to monitor.  MD response: change morphine 2mg  q3h to 2mg  q2h.  Riley Kill RN

## 2017-12-28 LAB — GLUCOSE, CAPILLARY
Glucose-Capillary: 133 mg/dL — ABNORMAL HIGH (ref 70–99)
Glucose-Capillary: 145 mg/dL — ABNORMAL HIGH (ref 70–99)
Glucose-Capillary: 155 mg/dL — ABNORMAL HIGH (ref 70–99)
Glucose-Capillary: 158 mg/dL — ABNORMAL HIGH (ref 70–99)
Glucose-Capillary: 160 mg/dL — ABNORMAL HIGH (ref 70–99)
Glucose-Capillary: 166 mg/dL — ABNORMAL HIGH (ref 70–99)
Glucose-Capillary: 175 mg/dL — ABNORMAL HIGH (ref 70–99)

## 2017-12-28 LAB — CBC
HCT: 29.8 % — ABNORMAL LOW (ref 36.0–46.0)
Hemoglobin: 9.1 g/dL — ABNORMAL LOW (ref 12.0–15.0)
MCH: 28.6 pg (ref 26.0–34.0)
MCHC: 30.5 g/dL (ref 30.0–36.0)
MCV: 93.7 fL (ref 80.0–100.0)
NRBC: 0 % (ref 0.0–0.2)
PLATELETS: 276 10*3/uL (ref 150–400)
RBC: 3.18 MIL/uL — AB (ref 3.87–5.11)
RDW: 16.6 % — ABNORMAL HIGH (ref 11.5–15.5)
WBC: 12.8 10*3/uL — ABNORMAL HIGH (ref 4.0–10.5)

## 2017-12-28 LAB — BASIC METABOLIC PANEL
ANION GAP: 10 (ref 5–15)
BUN: 68 mg/dL — ABNORMAL HIGH (ref 8–23)
CALCIUM: 8.8 mg/dL — AB (ref 8.9–10.3)
CO2: 30 mmol/L (ref 22–32)
Chloride: 99 mmol/L (ref 98–111)
Creatinine, Ser: 2.67 mg/dL — ABNORMAL HIGH (ref 0.44–1.00)
GFR calc Af Amer: 18 mL/min — ABNORMAL LOW (ref >60)
GFR, EST NON AFRICAN AMERICAN: 15 mL/min — AB (ref >60)
GLUCOSE: 185 mg/dL — AB (ref 70–99)
Potassium: 5.1 mmol/L (ref 3.5–5.1)
Sodium: 139 mmol/L (ref 135–145)

## 2017-12-28 MED ORDER — PREDNISONE 20 MG PO TABS
20.0000 mg | ORAL_TABLET | Freq: Every day | ORAL | Status: DC
  Administered 2017-12-29 – 2017-12-31 (×3): 20 mg via ORAL
  Filled 2017-12-28 (×3): qty 1

## 2017-12-28 MED ORDER — LORAZEPAM 2 MG/ML IJ SOLN
0.5000 mg | INTRAMUSCULAR | Status: DC | PRN
  Administered 2017-12-28 – 2017-12-30 (×3): 0.5 mg via INTRAVENOUS
  Filled 2017-12-28 (×4): qty 1

## 2017-12-28 MED ORDER — MORPHINE SULFATE (PF) 2 MG/ML IV SOLN
2.0000 mg | Freq: Once | INTRAVENOUS | Status: AC
  Administered 2017-12-28: 2 mg via INTRAVENOUS
  Filled 2017-12-28: qty 1

## 2017-12-28 MED ORDER — NITROGLYCERIN 2 % TD OINT
1.0000 [in_us] | TOPICAL_OINTMENT | Freq: Four times a day (QID) | TRANSDERMAL | Status: DC
  Administered 2017-12-28 – 2018-01-03 (×23): 1 [in_us] via TOPICAL
  Filled 2017-12-28: qty 30

## 2017-12-28 MED ORDER — MORPHINE SULFATE (PF) 2 MG/ML IV SOLN
2.0000 mg | INTRAVENOUS | Status: DC | PRN
  Administered 2017-12-28 (×2): 2 mg via INTRAVENOUS
  Filled 2017-12-28 (×2): qty 1

## 2017-12-28 NOTE — Progress Notes (Signed)
RN paged the following to [facility], "2W16: [place], pt has become more agitated and combative. Day RN [place] leaving so please call night RN [place]endra. Thanks.  RN spoke with Dr Cruzita Lederer around 1800 about pt becoming more combative. RN administered morphine 2mg  and Ativan 0.5mg . Dr Cruzita Lederer gave a verbal order to add in a one time dose of morphine 2mg  or ativan 0.5 if pt needed it. Pt had become calm and fallen asleep, so RN did not administer additional meds. At shift change around 1940, pt became more combative and agitated and RN paged MD. MD ordered extra dose of morphine to be given. Day RN gave report to night RN and had no further questions.   Riley Kill, RN

## 2017-12-28 NOTE — Progress Notes (Signed)
RT NOTE: RT attempted to administer nebulizer treatment to patient. Patient became very agitated when RT placed mask on patient for treatment. RT attempted multiple times to coach patient to take treatment but patient continues to swing her arms and remove mask. RT was able to administer most of nebulizer treatment. Vitals are stable. Lung sounds are clear but patient has some upper airway wheezing. RT will continue to monitor.

## 2017-12-28 NOTE — Progress Notes (Signed)
Progress Note  Patient Name: [name] Date of Encounter: 12/28/2017  Primary Cardiologist: Minus Breeding, MD   Subjective   Sedated and non responsive except to become agitated with stimuli.  No respiratory distress  Inpatient Medications    Scheduled Meds: . sodium chloride   Intravenous Once  . alteplase  2 mg Intracatheter Once  . atorvastatin  40 mg Per Tube q1800  . cholecalciferol  400 Units Per Tube Daily  . clopidogrel  75 mg Per Tube Daily  . ferrous sulfate  300 mg Per Tube Daily  . free water  135 mL Per Tube Q8H  . hydrALAZINE  10 mg Oral Q8H  . insulin aspart  0-15 Units Subcutaneous Q4H  . insulin detemir  10 Units Subcutaneous BID  . levalbuterol  0.63 mg Nebulization TID   And  . ipratropium  0.5 mg Nebulization TID  . metoCLOPramide (REGLAN) injection  5 mg Intravenous Q6H  . metoprolol tartrate  75 mg Oral BID  . mirtazapine  7.5 mg Per Tube QHS  . multivitamin  15 mL Per Tube Daily  . pantoprazole sodium  40 mg Per Tube BID  . [START ON 12/29/2017] predniSONE  20 mg Oral Q breakfast  . sennosides  5 mL Per Tube QHS   Continuous Infusions: . feeding supplement (JEVITY 1.2 CAL) 1,000 mL (12/28/17 9024)  . nitroGLYCERIN 5 mcg/min (12/26/17 1500)  . piperacillin-tazobactam (ZOSYN)  IV 2.25 g (12/28/17 0508)   PRN Meds: acetaminophen, fentaNYL, hydrALAZINE, levalbuterol, loperamide HCl, LORazepam, morphine injection, nitroGLYCERIN, ondansetron (ZOFRAN) IV, sodium chloride flush   Vital Signs    Vitals:   12/28/17 0452 12/28/17 0507 12/28/17 0742 12/28/17 0831  BP:  132/77 (!) 153/83   Pulse:    (!) 106  Resp:   13 16  Temp:   98.2 F (36.8 C)   TempSrc:   Axillary   SpO2:   100% 100%  Weight: 58.1 kg     Height:        Intake/Output Summary (Last 24 hours) at 12/28/2017 1045 Last data filed at 12/28/2017 0900 Gross per 24 hour  Intake 480 ml  Output 700 ml  Net -220 ml   Filed Weights   12/25/17 0619 12/27/17 0500 12/28/17 0452   Weight: 56.2 kg 61.2 kg 58.1 kg    Telemetry    NSR, ectopy - Personally Reviewed  ECG    NA - Personally Reviewed  Physical Exam   GEN:    Severely chronically ill but not in acute distress.   Neck:   Difficult to assess JVD Cardiac: RRR, distant heart sounds. Respiratory:   Transmitted upper airway sounds and shallow respirations.  GI: Soft, nontender, non-distended, normal bowel sounds  MS:  No edema; No deformity. Neuro:   Nonfocal, moves all extremities   Labs    Chemistry Recent Labs  Lab 12/26/17 0519 12/27/17 0400 12/28/17 0502  NA 137 137 139  K 5.0 5.7* 5.1  CL 99 100 99  CO2 28 29 30   GLUCOSE 205* 180* 185*  BUN 63* 70* 68*  CREATININE 2.52* 2.64* 2.67*  CALCIUM 9.4 8.8* 8.8*  ALBUMIN  --  1.7*  --   GFRNONAA 16* 16* 15*  GFRAA 19* 18* 18*  ANIONGAP 10 8 10      Hematology Recent Labs  Lab 12/26/17 0519 12/27/17 0400 12/28/17 0502  WBC 16.1* 13.0* 12.8*  RBC 3.34* 3.00* 3.18*  HGB 9.4* 8.7* 9.1*  HCT 30.9* 27.7* 29.8*  MCV 92.5  92.3 93.7  MCH 28.1 29.0 28.6  MCHC 30.4 31.4 30.5  RDW 17.0* 16.7* 16.6*  PLT 332 285 276    Cardiac Enzymes Recent Labs  Lab 12/23/17 2200 12/24/17 0428 12/24/17 1015 12/24/17 1646  TROPONINI 2.13* 3.17* 3.29* 3.08*   No results for input(s): TROPIPOC in the last 168 hours.   BNP Recent Labs  Lab 12/23/17 2200  BNP >4,500.0*     DDimer No results for input(s): DDIMER in the last 168 hours.   Radiology    No results found.  Cardiac Studies   Echo11/21/2019 Left ventricle: The cavity size was normal. Wall thickness was increased in a pattern of mild LVH. The estimated ejection fraction was 30%. Diffuse hypokinesis. Features are consistent with a pseudonormal left ventricular filling pattern, with concomitant abnormal relaxation and increased filling pressure (grade 2 diastolic dysfunction). - Aortic valve: There was no stenosis. - Mitral valve: There was mild to moderate  regurgitation. - Left atrium: The atrium was mildly dilated. - Right ventricle: The cavity size was normal. Systolic function was mildly reduced. - Pulmonary arteries: No complete TR doppler jet so unable to estimate PA systolic pressure. - Inferior vena cava: The vessel was normal in size. The respirophasic diameter changes were in the normal range (>= 50%), consistent with normal central venous pressure.   Patient Profile     84 y.o. female with CAD s/p CABG, recent ischemic stroke, HTN, DM, HLP, recent failure to thrive due to swallowing problems s/p botox injection for achalasia, developed acute pulmonary edema and NSTEMI on 12/20/2017, for which cardiology is following  Assessment & Plan    ACUTE SYSTOLIC HF:    Tolerating hydralazine and IV NTG.  Change to NTG paste.  For afterload reduction.  Still holding off on Lasix given the fact that she probably has relatively low intravascular volume with her malnutrition.  She is oxygenating well on 2 liters.     Signed, Minus Breeding, MD  12/28/2017, 10:45 AM

## 2017-12-28 NOTE — Progress Notes (Signed)
PROGRESS NOTE  [name] BTD:[id number] DOB: 1927/08/22 DOA: 12/05/2017 PCP: Deland Pretty, MD   LOS: 19 days   Brief Narrative / Interim history: 84 year old female history of diabetes type 2, hypertension, hyperlipidemia, coronary artery disease with history of CABG, prior CVA, chronic kidney disease stage III admitted to the hospital on 12/05/2017 with dysarthria.  MRI did not show any strokes.  Hospital course complicated by failure to thrive, inability to eat status post PEG tube placement.  Subjective: -Agitated, alert, son is at bedside.  He appreciates that she is getting stronger  Assessment & Plan: Principal Problem:   Dysarthria Active Problems:   Diabetes mellitus ([place])   Hyperlipidemia   Essential hypertension   Coronary atherosclerosis   Stroke (cerebrum) ([place])   Stage 3 chronic kidney disease ([place])   Acute lower UTI   Adult failure to thrive   UTI (urinary tract infection)   DNR (do not resuscitate) discussion   Palliative care by specialist   CHF (congestive heart failure) ([place])   Aspiration pneumonia due to gastric secretions ([place])   NSTEMI (non-ST elevated myocardial infarction) ([place])   Acute respiratory failure with hypoxia secondary to pulmonary edema, acute combined systolic and diastolic CHF -On 10/62, patient had rapid response with acute pulmonary edema, likely due to volume overload, BNP elevated, placed on IV Lasix.  Patient was found to be 27 L positive, had been receiving tube feeds, free water and IV fluids during hospitalization. Repeat echo showed EF of 30% with grade 2 diastolic dysfunction, troponins trended up to 12.1 -Prolonged hospitalization with episodes of agitation, respiratory distress, aspiration.  Patient had acute respiratory distress on 12/25/2017.   -This morning she is somewhat sleepy, son is at bedside and tells me that she just received morphine -Later on she woke up and became extremely agitated -Received Lasix 60 mg total  11/25, creatinine continues to trend up but now seems to have stabilized.  Continue to hold Lasix per cardiology  [place] -Intermittent, ENT consulted, discussed with Dr. Wilburn Cornelia, no significant findings on flexible laryngoscopy except for copious dried secretions in the nasopharynx, supraglottis and hypopharynx  NSTEMI possibly due to volume overload/acute HF -Troponins elevated at 8.87, trended up to 12.1, patient was placed on IV heparin drip and cardiology was consulted. -Recent 2D echo on 11/09/2017 had shown EF of 35 to 50% with grade 1 diastolic dysfunction. Repeat echo showed EF of 30% with grade 2 diastolic dysfunction, diffuse hypokinesis -She is now off the heparin drip, cardiology following, start nitroglycerin paste and stop the continuous infusion  Acute metabolic encephalopathy -Patient had presented with dysarthria, confusion.  After discussion with the patient's son (physician), he states that patient does not have dementia.  -MRI of the brain showed chronic advanced atherosclerosis disease, chronic lacunar infarcts however no new infarct. -UA was positive 11/5, had shown more than 100,000 colonies of Enterococcus faecalis.  Repeat urine culture on 11/13, showed no growth -LFTs, ammonia level normal.  Neurology evaluation done on 11/20, seen by Dr. Cheral Marker, likely patient has vascular dementia due to chronic infarcts and chronic atherosclerotic disease. -Mental status fluctuates, will discontinue high-dose IV steroids today hopefully that will help.  Aspiration PNA -Patient was started on IV vancomycin and Zosyn on 11/24, today is day 5/8 -Aspiration precautions.  -Discontinue vancomycin today maintain on Zosyn alone for 3 additional days  Anemia: Acute on chronic -Baseline appears to be 7.5-8.  FOBT negative, not far from her baseline of 7.5.  May have been hemodilution. -Epo level is  slightly above normal, received 2 units packed RBC transfusion for hemoglobin of 7.3  on 11/23 given NSTEMI and demand ischemia.  Heparin drip was discontinued after 48 hours. -H&H stable  Diabetes mellitus (HCC) type II, uncontrolled -Continue sliding scale insulin to every 4 hours -Change Levemir to split dose, 10 units twice daily  -CBGs 160-180s, keep on same regimen  Failure to thrive, status post PEG tube, dysphagia -Off TPN -High residuals on tube feeds, placed on scheduled Reglan and restart tube feeds at low-dose. -Tube feeds at 30 cc an hour, will advance to 35 cc/h during daytime and back to 30 cc/h overnight  Pressure injury -Stage II partial-thickness with shallow open ulcer, red-pink wound bed with slough, medial sacrum, present on admission -Wound care per nursing  Subcutaneous nodule; non-incarcerated umbilical hernia probably lymph node -Seen by surgery, and recommended no surgical treatment at this time  Acute kidney injury on CKD stage III -Baseline creatinine 1.3-1.4 -Creatinine plateaued, likely up due to Lasix   Scheduled Meds: . sodium chloride   Intravenous Once  . alteplase  2 mg Intracatheter Once  . atorvastatin  40 mg Per Tube q1800  . cholecalciferol  400 Units Per Tube Daily  . clopidogrel  75 mg Per Tube Daily  . ferrous sulfate  300 mg Per Tube Daily  . free water  135 mL Per Tube Q8H  . hydrALAZINE  10 mg Oral Q8H  . insulin aspart  0-15 Units Subcutaneous Q4H  . insulin detemir  10 Units Subcutaneous BID  . levalbuterol  0.63 mg Nebulization TID   And  . ipratropium  0.5 mg Nebulization TID  . metoCLOPramide (REGLAN) injection  5 mg Intravenous Q6H  . metoprolol tartrate  75 mg Oral BID  . mirtazapine  7.5 mg Per Tube QHS  . multivitamin  15 mL Per Tube Daily  . nitroGLYCERIN  1 inch Topical Q6H  . pantoprazole sodium  40 mg Per Tube BID  . [START ON 12/29/2017] predniSONE  20 mg Oral Q breakfast  . sennosides  5 mL Per Tube QHS   Continuous Infusions: . feeding supplement (JEVITY 1.2 CAL) 1,000 mL (12/28/17 8469)   . piperacillin-tazobactam (ZOSYN)  IV 2.25 g (12/28/17 0508)   PRN Meds:.acetaminophen, fentaNYL, hydrALAZINE, levalbuterol, loperamide HCl, LORazepam, morphine injection, nitroGLYCERIN, ondansetron (ZOFRAN) IV, sodium chloride flush  DVT prophylaxis: SCDs Code Status: Full code Family Communication: discussed with son at bedside Disposition Plan: To be determined  Consultants:   Cardiology   ENT  Palliative   Procedures:   2D echo:  Impressions: - Normal LV size with mild LV hypertrophy. EF 30%, diffuse hypokinesis. Moderate diastolic dysfunction. Normal RV size with mildly decreased systolic function. Mild to moderate MR.   Antimicrobials:  Vancomycin 11/24 >> 11/28  Zosyn 11/24 >>  Objective: Vitals:   12/28/17 0452 12/28/17 0507 12/28/17 0742 12/28/17 0831  BP:  132/77 (!) 153/83   Pulse:    (!) 106  Resp:   13 16  Temp:   98.2 F (36.8 C)   TempSrc:   Axillary   SpO2:   100% 100%  Weight: 58.1 kg     Height:        Intake/Output Summary (Last 24 hours) at 12/28/2017 1103 Last data filed at 12/28/2017 0900 Gross per 24 hour  Intake 480 ml  Output 700 ml  Net -220 ml   Filed Weights   12/25/17 0619 12/27/17 0500 12/28/17 0452  Weight: 56.2 kg 61.2 kg 58.1 kg  Examination:  Constitutional: Alert, confused, agitated at times Eyes: Pupils equal round and reactive to light, no icterus ENMT: Dry mucous membranes Neck: normal, supple Respiratory: Lungs are clear on anterior auscultation, no wheezing no crackles heard.  Shallow respiratory effort and tachypnea noted Cardiovascular: Regular rate and rhythm, no murmurs, trace lower extremity edema Abdomen: Soft, nontender, nondistended, positive bowel sounds, PEG tube in place Skin: no rashes seen Neurologic: No apparent focal deficits, moves all 4 extremities spontaneously and appears to have equal strength   Data Reviewed: I have independently reviewed following labs and imaging studies    CBC: Recent Labs  Lab 01/01/2018 0428 12/25/17 0435 12/26/17 0519 12/27/17 0400 12/28/17 0502  WBC 14.4* 12.5* 16.1* 13.0* 12.8*  HGB 9.6* 9.0* 9.4* 8.7* 9.1*  HCT 30.3* 28.6* 30.9* 27.7* 29.8*  MCV 89.1 90.5 92.5 92.3 93.7  PLT 302 294 332 285 381   Basic Metabolic Panel: Recent Labs  Lab 2018/01/01 0428 12/25/17 1331 12/26/17 0519 12/27/17 0400 12/28/17 0502  NA 135 136 137 137 139  K 4.5 5.0 5.0 5.7* 5.1  CL 97* 99 99 100 99  CO2 28 29 28 29 30   GLUCOSE 128* 216* 205* 180* 185*  BUN 40* 56* 63* 70* 68*  CREATININE 1.77* 2.28* 2.52* 2.64* 2.67*  CALCIUM 9.1 9.5 9.4 8.8* 8.8*  MG  --  2.0  --   --   --    GFR: Estimated Creatinine Clearance: 12 mL/min (A) (by C-G formula based on SCr of 2.67 mg/dL (H)). Liver Function Tests: Recent Labs  Lab 12/27/17 0400  ALBUMIN 1.7*   No results for input(s): LIPASE, AMYLASE in the last 168 hours. No results for input(s): AMMONIA in the last 168 hours. Coagulation Profile: No results for input(s): INR, PROTIME in the last 168 hours. Cardiac Enzymes: Recent Labs  Lab 12/22/17 0524 12/23/17 2200 Jan 01, 2018 0428 2018-01-01 1015 2018/01/01 1646  TROPONINI 5.12* 2.13* 3.17* 3.29* 3.08*   BNP (last 3 results) No results for input(s): PROBNP in the last 8760 hours. HbA1C: No results for input(s): HGBA1C in the last 72 hours. CBG: Recent Labs  Lab 12/27/17 1812 12/27/17 2018 12/28/17 0052 12/28/17 0356 12/28/17 0808  GLUCAP 209* 201* 166* 160* 175*   Lipid Profile: No results for input(s): CHOL, HDL, LDLCALC, TRIG, CHOLHDL, LDLDIRECT in the last 72 hours. Thyroid Function Tests: No results for input(s): TSH, T4TOTAL, FREET4, T3FREE, THYROIDAB in the last 72 hours. Anemia Panel: No results for input(s): VITAMINB12, FOLATE, FERRITIN, TIBC, IRON, RETICCTPCT in the last 72 hours. Urine analysis:    Component Value Date/Time   COLORURINE YELLOW 12/23/2017 1324   APPEARANCEUR CLOUDY (A) 12/23/2017 1324   LABSPEC 1.010  12/23/2017 1324   PHURINE 7.0 12/23/2017 1324   GLUCOSEU NEGATIVE 12/23/2017 1324   HGBUR MODERATE (A) 12/23/2017 1324   BILIRUBINUR NEGATIVE 12/23/2017 1324   KETONESUR NEGATIVE 12/23/2017 1324   PROTEINUR 100 (A) 12/23/2017 1324   UROBILINOGEN 0.2 06/30/2011 1214   NITRITE NEGATIVE 12/23/2017 1324   LEUKOCYTESUR LARGE (A) 12/23/2017 1324   Sepsis Labs: Invalid input(s): PROCALCITONIN, LACTICIDVEN  Recent Results (from the past 240 hour(s))  Culture, blood (routine x 2)     Status: None   Collection Time: 12/22/17 12:31 PM  Result Value Ref Range Status   Specimen Description BLOOD LEFT ANTECUBITAL  Final   Special Requests   Final    BOTTLES DRAWN AEROBIC AND ANAEROBIC Blood Culture adequate volume   Culture   Final    NO GROWTH 5  DAYS Performed at [facility], [place] [address] [place], [place] [address]    Report Status 12/27/2017 FINAL  Final  Culture, blood (routine x 2)     Status: None   Collection Time: 12/22/17 12:31 PM  Result Value Ref Range Status   Specimen Description BLOOD BLOOD LEFT HAND  Final   Special Requests   Final    BOTTLES DRAWN AEROBIC AND ANAEROBIC Blood Culture results may not be optimal due to an inadequate volume of blood received in culture bottles   Culture   Final    NO GROWTH 5 DAYS Performed at [facility], [place] [address] [place], [place] [address]    Report Status 12/27/2017 FINAL  Final  Urine Culture     Status: Abnormal   Collection Time: 12/23/17  1:24 PM  Result Value Ref Range Status   Specimen Description URINE, RANDOM  Final   Special Requests   Final    NONE Performed at [facility], [place] [address] [place], [place] [address]    Culture >=100,000 COLONIES/mL ENTEROCOCCUS FAECALIS (A)  Final   Report Status 12/26/2017 FINAL  Final   Organism ID, Bacteria ENTEROCOCCUS FAECALIS (A)  Final      Susceptibility   Enterococcus faecalis - MIC*    AMPICILLIN <=2 SENSITIVE Sensitive     LEVOFLOXACIN 1 SENSITIVE  Sensitive     NITROFURANTOIN <=16 SENSITIVE Sensitive     VANCOMYCIN 1 SENSITIVE Sensitive     * >=100,000 COLONIES/mL ENTEROCOCCUS FAECALIS      Radiology Studies: No results found.  Marzetta Board, MD, PhD [facility] Pager [phone]  If 7PM-7AM, please contact night-coverage [email or web address] Password [email or web address] 12/28/2017, 11:03 AM

## 2017-12-29 LAB — GLUCOSE, CAPILLARY
Glucose-Capillary: 105 mg/dL — ABNORMAL HIGH (ref 70–99)
Glucose-Capillary: 131 mg/dL — ABNORMAL HIGH (ref 70–99)
Glucose-Capillary: 132 mg/dL — ABNORMAL HIGH (ref 70–99)
Glucose-Capillary: 139 mg/dL — ABNORMAL HIGH (ref 70–99)
Glucose-Capillary: 153 mg/dL — ABNORMAL HIGH (ref 70–99)
Glucose-Capillary: 162 mg/dL — ABNORMAL HIGH (ref 70–99)

## 2017-12-29 LAB — CBC
HCT: 28.7 % — ABNORMAL LOW (ref 36.0–46.0)
Hemoglobin: 8.6 g/dL — ABNORMAL LOW (ref 12.0–15.0)
MCH: 27.9 pg (ref 26.0–34.0)
MCHC: 30 g/dL (ref 30.0–36.0)
MCV: 93.2 fL (ref 80.0–100.0)
Platelets: 276 10*3/uL (ref 150–400)
RBC: 3.08 MIL/uL — ABNORMAL LOW (ref 3.87–5.11)
RDW: 16.4 % — ABNORMAL HIGH (ref 11.5–15.5)
WBC: 9.3 10*3/uL (ref 4.0–10.5)
nRBC: 0.2 % (ref 0.0–0.2)

## 2017-12-29 LAB — BASIC METABOLIC PANEL
Anion gap: 6 (ref 5–15)
BUN: 65 mg/dL — AB (ref 8–23)
CO2: 32 mmol/L (ref 22–32)
Calcium: 8.1 mg/dL — ABNORMAL LOW (ref 8.9–10.3)
Chloride: 101 mmol/L (ref 98–111)
Creatinine, Ser: 2.41 mg/dL — ABNORMAL HIGH (ref 0.44–1.00)
GFR calc Af Amer: 20 mL/min — ABNORMAL LOW (ref >60)
GFR calc non Af Amer: 17 mL/min — ABNORMAL LOW (ref >60)
Glucose, Bld: 127 mg/dL — ABNORMAL HIGH (ref 70–99)
Potassium: 4.1 mmol/L (ref 3.5–5.1)
Sodium: 139 mmol/L (ref 135–145)

## 2017-12-29 MED ORDER — PIPERACILLIN-TAZOBACTAM 3.375 G IVPB
3.3750 g | Freq: Two times a day (BID) | INTRAVENOUS | Status: AC
  Administered 2017-12-29 – 2017-12-30 (×4): 3.375 g via INTRAVENOUS
  Filled 2017-12-29 (×4): qty 50

## 2017-12-29 NOTE — Progress Notes (Signed)
Pharmacy Antibiotic Note  [name] is a 84 y.o. female admitted on 12/05/2017 with AMS and weight loss.  Patient was started on antibiotics for UTI on admission which resolved.  Antibiotics restarted 11/24 to cover for aspiration PNA.  Pharmacy has been consulted for Zosyn dosing.  SCr improving to 2.41 but still above baseline (1.3-1.4).  Plan: Change Zosyn to 3.375 g IV q12h (extended interval dosing for CrCl<20 mL/min) Monitor clinical picture, renal function, antibiotic de-escalation   Height: 5\' 3"  (160 cm) Weight: 127 lb 13.9 oz (58 kg) IBW/kg (Calculated) : 52.4  Temp (24hrs), Avg:98.3 F (36.8 C), Min:97.6 F (36.4 C), Max:98.6 F (37 C)  Recent Labs  Lab 12/25/17 0435 12/25/17 1331 12/26/17 0519 12/27/17 0400 12/28/17 0502 12/29/17 0406  WBC 12.5*  --  16.1* 13.0* 12.8* 9.3  CREATININE  --  2.28* 2.52* 2.64* 2.67* 2.41*    Estimated Creatinine Clearance: 13.3 mL/min (A) (by C-G formula based on SCr of 2.41 mg/dL (H)).    Allergies  Allergen Reactions  . Contrast Media [Iodinated Diagnostic Agents]     Sensitivity, kidney issues    Antimicrobials this admission: Vanc 11/24 >> 11/28 Zosyn 11/24 >> (12/1) CTX 11/5 >> 11/8 Amp 11/8 >> 11/12  Microbiology results: 11/23 Ucx >>100k E faecalis (pan-S) 11/22 BCx >> no growth 11/5 UCx (catherized) >> E faecalis (pan-S) 11/6 MRSA PCR >> positive  Thank you for allowing pharmacy to be a part of this patient's care.   Brendolyn Patty, PharmD PGY1 Pharmacy Resident Phone [phone]  12/29/2017   12:15 PM

## 2017-12-29 NOTE — Progress Notes (Signed)
Pt has [place] off, spo2 100% on RA

## 2017-12-29 NOTE — Progress Notes (Signed)
  Speech Language Pathology Treatment: Dysphagia  Patient Details Name: [name] MRN: [id number] DOB: [birth year] Today's Date: 12/29/2017 Time: 9774-1423 SLP Time Calculation (min) (ACUTE ONLY): 22 min  Assessment / Plan / Recommendation Clinical Impression  Ongoing f/u to for dysphagia management/PO readiness.  Pt somnolent, despite max verbal/tactile cues in an effort to awaken.  Daughter present, arrived from [place] last night.  Provided education re: issues surrounding swallowing related to esophageal function and mental status.  Oral care provided, removing thick secretions from oral cavity - pt maintained eyes closed, not following commands/resistant to use of [place] for thorough cleaning.  Spontaneous throat-clearing observed, no spontaneous swallow.  No ice chips offered given degree of lethargy.   SLP will continue to follow for PO readiness; continue NPO due to poor mental status.  Daughter agrees.   HPI HPI: 84 y.o. female with CAD s/p CABG, recent ischemic stroke, HTN, DM, HLP, recent failure to thrive due to swallowing problems s/p botox injection for achalasia, developed acute pulmonary edema and NSTEMI on 12/20/2017. SLP consulted during recent admission; bedside swallow on 11/05/17 revealed functional oropharyngeal swallow and suspected esophageal dysphagia. Had esophagram 11/06/17 which showed moderately large hiatal hernia with poor motility and retained barium throughout the esophagus. PEG 11/10.  ENT consult 11/27 for stridor No evidence of vocal cord paralysis, mass or lesion.  No evidence of airway difficulty and no evidence of stridor during examination.  The patient has copious dry secretions in the hypopharynx and supraglottic regions.  Decreased sensation of the supraglottic region.      SLP Plan  Continue with current plan of care       Recommendations  Diet recommendations: NPO Medication Administration: Via alternative means                Oral Care  Recommendations: Oral care QID Follow up Recommendations: Other (comment)(tbd) SLP Visit Diagnosis: Dysphagia, unspecified (R13.10) Plan: Continue with current plan of care       [employer] Tivis Ringer, [place] CCC/SLP Acute Rehabilitation Services Office number [phone] Pager [phone]  Juan Quam Laurice 12/29/2017, 12:13 PM

## 2017-12-29 NOTE — Progress Notes (Signed)
PROGRESS NOTE  [name] WCB:[id number] DOB: 15-Sep-1927 DOA: 12/05/2017 PCP: Deland Pretty, MD   LOS: 20 days   Brief Narrative / Interim history: 84 year old female history of diabetes type 2, hypertension, hyperlipidemia, coronary artery disease with history of CABG, prior CVA, chronic kidney disease stage III admitted to the hospital on 12/05/2017 with dysarthria.  MRI did not show any strokes.  Hospital course complicated by failure to thrive, inability to eat status post PEG tube placement.  Subjective: -Appears more comfortable this morning, lethargic but opens eyes when called.  Daughter is at bedside  Assessment & Plan: Principal Problem:   Dysarthria Active Problems:   Diabetes mellitus ([place])   Hyperlipidemia   Essential hypertension   Coronary atherosclerosis   Stroke (cerebrum) ([place])   Stage 3 chronic kidney disease ([place])   Acute lower UTI   Adult failure to thrive   UTI (urinary tract infection)   DNR (do not resuscitate) discussion   Palliative care by specialist   CHF (congestive heart failure) ([place])   Aspiration pneumonia due to gastric secretions ([place])   NSTEMI (non-ST elevated myocardial infarction) ([place])   Acute respiratory failure with hypoxia secondary to pulmonary edema, acute combined systolic and diastolic CHF -On 61/60, patient had rapid response with acute pulmonary edema, likely due to volume overload, BNP elevated, placed on IV Lasix.  Patient was found to be 27 L positive, had been receiving tube feeds, free water and IV fluids during hospitalization. Repeat echo showed EF of 30% with grade 2 diastolic dysfunction, troponins trended up to 12.1 -Prolonged hospitalization with episodes of agitation, respiratory distress, aspiration.  Patient had acute respiratory distress on 12/25/2017.   -This morning she is somewhat sleepy, son is at bedside and tells me that she just received morphine -Later on she woke up and became extremely agitated -Received  Lasix 60 mg total 11/25, creatinine initially trended up but now is coming down.  Appreciate ongoing cardiology follow-up  [place] -Intermittent, ENT consulted, discussed with Dr. Wilburn Cornelia, no significant findings on flexible laryngoscopy except for copious dried secretions in the nasopharynx, supraglottis and hypopharynx -For most part this is resolved and I have not heard in the last couple of days  NSTEMI possibly due to volume overload/acute HF -Troponins elevated at 8.87, trended up to 12.1, patient was placed on IV heparin drip and cardiology was consulted. -Recent 2D echo on 11/09/2017 had shown EF of 35 to 50% with grade 1 diastolic dysfunction. Repeat echo showed EF of 30% with grade 2 diastolic dysfunction, diffuse hypokinesis -She was initially on heparin as well as nitroglycerin infusion, these were discontinued and currently she is on nitroglycerin paste  Acute metabolic encephalopathy -Patient had presented with dysarthria, confusion.  After discussion with the patient's son (physician), he states that patient does not have dementia.  -MRI of the brain showed chronic advanced atherosclerosis disease, chronic lacunar infarcts however no new infarct. -UA was positive 11/5, had shown more than 100,000 colonies of Enterococcus faecalis.  Repeat urine culture on 11/13, showed no growth -LFTs, ammonia level normal.  Neurology evaluation done on 11/20, seen by Dr. Cheral Marker, likely patient has vascular dementia due to chronic infarcts and chronic atherosclerotic disease. -IV steroids weaned off yesterday, continue low-dose prednisone and will try to taper off in a few days  Aspiration PNA -Patient was started on IV vancomycin and Zosyn on 11/24, today is day 5/8 -Aspiration precautions.  -Discontinue vancomycin today maintain on Zosyn, 2 additional days remaining  Anemia: Acute on chronic -Baseline  appears to be 7.5-8.  FOBT negative, not far from her baseline of 7.5.  May have been  hemodilution. -Epo level is slightly above normal, received 2 units packed RBC transfusion for hemoglobin of 7.3 on 11/23 given NSTEMI and demand ischemia.  Heparin drip was discontinued after 48 hours. -H&H stable  Diabetes mellitus (HCC) type II, uncontrolled -Continue sliding scale insulin to every 4 hours -Change Levemir to split dose, 10 units twice daily  -Continue current regimen, fasting CBG this morning 127  Failure to thrive, status post PEG tube, dysphagia -Off TPN -High residuals on tube feeds, placed on scheduled Reglan and restart tube feeds at low-dose. -Tube feeds at 30 cc an hour, will advance to 35 cc/h during daytime and back to 30 cc/h overnight, she seems to have been tolerating this regimen yesterday well, continue today  Pressure injury -Stage II partial-thickness with shallow open ulcer, red-pink wound bed with slough, medial sacrum, present on admission -Wound care per nursing  Subcutaneous nodule; non-incarcerated umbilical hernia probably lymph node -Seen by surgery, and recommended no surgical treatment at this time  Acute kidney injury on CKD stage III -Baseline creatinine 1.3-1.4 -Creatinine now slowly improving,   Scheduled Meds: . sodium chloride   Intravenous Once  . alteplase  2 mg Intracatheter Once  . atorvastatin  40 mg Per Tube q1800  . cholecalciferol  400 Units Per Tube Daily  . clopidogrel  75 mg Per Tube Daily  . ferrous sulfate  300 mg Per Tube Daily  . free water  135 mL Per Tube Q8H  . hydrALAZINE  10 mg Oral Q8H  . insulin aspart  0-15 Units Subcutaneous Q4H  . insulin detemir  10 Units Subcutaneous BID  . levalbuterol  0.63 mg Nebulization TID   And  . ipratropium  0.5 mg Nebulization TID  . metoCLOPramide (REGLAN) injection  5 mg Intravenous Q6H  . metoprolol tartrate  75 mg Oral BID  . mirtazapine  7.5 mg Per Tube QHS  . multivitamin  15 mL Per Tube Daily  . nitroGLYCERIN  1 inch Topical Q6H  . pantoprazole sodium  40  mg Per Tube BID  . predniSONE  20 mg Oral Q breakfast  . sennosides  5 mL Per Tube QHS   Continuous Infusions: . feeding supplement (JEVITY 1.2 CAL) 1,000 mL (12/28/17 2022)  . piperacillin-tazobactam (ZOSYN)  IV     PRN Meds:.acetaminophen, fentaNYL, hydrALAZINE, levalbuterol, loperamide HCl, LORazepam, morphine injection, nitroGLYCERIN, ondansetron (ZOFRAN) IV, sodium chloride flush  DVT prophylaxis: SCDs Code Status: Full code Family Communication: discussed with daughter at bedside Disposition Plan: To be determined  Consultants:   Cardiology   ENT  Palliative   Procedures:   2D echo:  Impressions: - Normal LV size with mild LV hypertrophy. EF 30%, diffuse hypokinesis. Moderate diastolic dysfunction. Normal RV size with mildly decreased systolic function. Mild to moderate MR.   Antimicrobials:  Vancomycin 11/24 >> 11/28  Zosyn 11/24 >>  Objective: Vitals:   12/29/17 0918 12/29/17 1016 12/29/17 1100 12/29/17 1322  BP:  (!) 116/56 (!) 117/55 (!) 117/55  Pulse:  88 75   Resp:   12   Temp:   98.9 F (37.2 C)   TempSrc:   Axillary   SpO2: 100%  100%   Weight:      Height:        Intake/Output Summary (Last 24 hours) at 12/29/2017 1326 Last data filed at 12/29/2017 0500 Gross per 24 hour  Intake -  Output  650 ml  Net -650 ml   Filed Weights   12/27/17 0500 12/28/17 0452 12/29/17 0300  Weight: 61.2 kg 58.1 kg 58 kg    Examination:  Constitutional: Lethargic Eyes: No icterus seen ENMT: Dry mucous membranes Neck: Normal, supple Respiratory: Clear anterior auscultation with shallow breathing, no wheezing or crackles heard Cardiovascular: Regular rate and rhythm, no murmurs, trace edema, + JVD Abdomen: Soft, NT, ND, positive bowel sounds, PEG tube in place Skin: no rashes seen Neurologic: Lethargic, nonfocal   Data Reviewed: I have independently reviewed following labs and imaging studies   CBC: Recent Labs  Lab 12/25/17 0435 12/26/17 0519  12/27/17 0400 12/28/17 0502 12/29/17 0406  WBC 12.5* 16.1* 13.0* 12.8* 9.3  HGB 9.0* 9.4* 8.7* 9.1* 8.6*  HCT 28.6* 30.9* 27.7* 29.8* 28.7*  MCV 90.5 92.5 92.3 93.7 93.2  PLT 294 332 285 276 789   Basic Metabolic Panel: Recent Labs  Lab 12/25/17 1331 12/26/17 0519 12/27/17 0400 12/28/17 0502 12/29/17 0406  NA 136 137 137 139 139  K 5.0 5.0 5.7* 5.1 4.1  CL 99 99 100 99 101  CO2 29 28 29 30  32  GLUCOSE 216* 205* 180* 185* 127*  BUN 56* 63* 70* 68* 65*  CREATININE 2.28* 2.52* 2.64* 2.67* 2.41*  CALCIUM 9.5 9.4 8.8* 8.8* 8.1*  MG 2.0  --   --   --   --    GFR: Estimated Creatinine Clearance: 13.3 mL/min (A) (by C-G formula based on SCr of 2.41 mg/dL (H)). Liver Function Tests: Recent Labs  Lab 12/27/17 0400  ALBUMIN 1.7*   No results for input(s): LIPASE, AMYLASE in the last 168 hours. No results for input(s): AMMONIA in the last 168 hours. Coagulation Profile: No results for input(s): INR, PROTIME in the last 168 hours. Cardiac Enzymes: Recent Labs  Lab 12/23/17 2200 12/24/17 0428 12/24/17 1015 12/24/17 1646  TROPONINI 2.13* 3.17* 3.29* 3.08*   BNP (last 3 results) No results for input(s): PROBNP in the last 8760 hours. HbA1C: No results for input(s): HGBA1C in the last 72 hours. CBG: Recent Labs  Lab 12/28/17 2029 12/29/17 0009 12/29/17 0441 12/29/17 0810 12/29/17 1203  GLUCAP 133* 132* 139* 105* 131*   Lipid Profile: No results for input(s): CHOL, HDL, LDLCALC, TRIG, CHOLHDL, LDLDIRECT in the last 72 hours. Thyroid Function Tests: No results for input(s): TSH, T4TOTAL, FREET4, T3FREE, THYROIDAB in the last 72 hours. Anemia Panel: No results for input(s): VITAMINB12, FOLATE, FERRITIN, TIBC, IRON, RETICCTPCT in the last 72 hours. Urine analysis:    Component Value Date/Time   COLORURINE YELLOW 12/23/2017 1324   APPEARANCEUR CLOUDY (A) 12/23/2017 1324   LABSPEC 1.010 12/23/2017 1324   PHURINE 7.0 12/23/2017 1324   GLUCOSEU NEGATIVE 12/23/2017  1324   HGBUR MODERATE (A) 12/23/2017 1324   BILIRUBINUR NEGATIVE 12/23/2017 1324   KETONESUR NEGATIVE 12/23/2017 1324   PROTEINUR 100 (A) 12/23/2017 1324   UROBILINOGEN 0.2 06/30/2011 1214   NITRITE NEGATIVE 12/23/2017 1324   LEUKOCYTESUR LARGE (A) 12/23/2017 1324   Sepsis Labs: Invalid input(s): PROCALCITONIN, LACTICIDVEN  Recent Results (from the past 240 hour(s))  Culture, blood (routine x 2)     Status: None   Collection Time: 12/22/17 12:31 PM  Result Value Ref Range Status   Specimen Description BLOOD LEFT ANTECUBITAL  Final   Special Requests   Final    BOTTLES DRAWN AEROBIC AND ANAEROBIC Blood Culture adequate volume   Culture   Final    NO GROWTH 5 DAYS Performed at [facility]  [facility], [place] [address], [place], [place] [address]    Report Status 12/27/2017 FINAL  Final  Culture, blood (routine x 2)     Status: None   Collection Time: 12/22/17 12:31 PM  Result Value Ref Range Status   Specimen Description BLOOD BLOOD LEFT HAND  Final   Special Requests   Final    BOTTLES DRAWN AEROBIC AND ANAEROBIC Blood Culture results may not be optimal due to an inadequate volume of blood received in culture bottles   Culture   Final    NO GROWTH 5 DAYS Performed at [facility], [place] [address], [place], [place] [address]    Report Status 12/27/2017 FINAL  Final  Urine Culture     Status: Abnormal   Collection Time: 12/23/17  1:24 PM  Result Value Ref Range Status   Specimen Description URINE, RANDOM  Final   Special Requests   Final    NONE Performed at [facility], [place] [address], [place], [place] [address]    Culture >=100,000 COLONIES/mL ENTEROCOCCUS FAECALIS (A)  Final   Report Status 12/26/2017 FINAL  Final   Organism ID, Bacteria ENTEROCOCCUS FAECALIS (A)  Final      Susceptibility   Enterococcus faecalis - MIC*    AMPICILLIN <=2 SENSITIVE Sensitive     LEVOFLOXACIN 1 SENSITIVE Sensitive     NITROFURANTOIN <=16 SENSITIVE Sensitive     VANCOMYCIN 1  SENSITIVE Sensitive     * >=100,000 COLONIES/mL ENTEROCOCCUS FAECALIS      Radiology Studies: No results found.  Marzetta Board, MD, PhD [facility] Pager [phone]  If 7PM-7AM, please contact night-coverage [email or web address] Password [email or web address] 12/29/2017, 1:26 PM

## 2017-12-30 LAB — BASIC METABOLIC PANEL
Anion gap: 11 (ref 5–15)
BUN: 56 mg/dL — ABNORMAL HIGH (ref 8–23)
CHLORIDE: 100 mmol/L (ref 98–111)
CO2: 32 mmol/L (ref 22–32)
Calcium: 8.1 mg/dL — ABNORMAL LOW (ref 8.9–10.3)
Creatinine, Ser: 2.21 mg/dL — ABNORMAL HIGH (ref 0.44–1.00)
GFR calc Af Amer: 22 mL/min — ABNORMAL LOW (ref >60)
GFR calc non Af Amer: 19 mL/min — ABNORMAL LOW (ref >60)
Glucose, Bld: 98 mg/dL (ref 70–99)
Potassium: 3.9 mmol/L (ref 3.5–5.1)
Sodium: 143 mmol/L (ref 135–145)

## 2017-12-30 LAB — GLUCOSE, CAPILLARY
Glucose-Capillary: 110 mg/dL — ABNORMAL HIGH (ref 70–99)
Glucose-Capillary: 126 mg/dL — ABNORMAL HIGH (ref 70–99)
Glucose-Capillary: 128 mg/dL — ABNORMAL HIGH (ref 70–99)
Glucose-Capillary: 140 mg/dL — ABNORMAL HIGH (ref 70–99)
Glucose-Capillary: 89 mg/dL (ref 70–99)
Glucose-Capillary: 99 mg/dL (ref 70–99)

## 2017-12-30 LAB — CBC
HEMATOCRIT: 29 % — AB (ref 36.0–46.0)
Hemoglobin: 8.6 g/dL — ABNORMAL LOW (ref 12.0–15.0)
MCH: 28.3 pg (ref 26.0–34.0)
MCHC: 29.7 g/dL — ABNORMAL LOW (ref 30.0–36.0)
MCV: 95.4 fL (ref 80.0–100.0)
Platelets: 251 10*3/uL (ref 150–400)
RBC: 3.04 MIL/uL — ABNORMAL LOW (ref 3.87–5.11)
RDW: 16.8 % — ABNORMAL HIGH (ref 11.5–15.5)
WBC: 9.7 10*3/uL (ref 4.0–10.5)
nRBC: 0 % (ref 0.0–0.2)

## 2017-12-30 MED ORDER — MORPHINE SULFATE (PF) 2 MG/ML IV SOLN
2.0000 mg | Freq: Four times a day (QID) | INTRAVENOUS | Status: DC | PRN
  Administered 2017-12-31 – 2018-01-01 (×2): 2 mg via INTRAVENOUS
  Filled 2017-12-30 (×2): qty 1

## 2017-12-30 MED ORDER — LORAZEPAM 2 MG/ML IJ SOLN
0.5000 mg | INTRAMUSCULAR | Status: DC | PRN
  Administered 2017-12-30 – 2017-12-31 (×6): 0.5 mg via INTRAVENOUS
  Filled 2017-12-30 (×5): qty 1

## 2017-12-30 NOTE — Progress Notes (Signed)
PROGRESS NOTE  [name] DUK:[id number] DOB: 1927/09/03 DOA: 12/05/2017 PCP: Deland Pretty, MD   LOS: 21 days   Brief Narrative / Interim history: 84 year old female history of diabetes type 2, hypertension, hyperlipidemia, coronary artery disease with history of CABG, prior CVA, chronic kidney disease stage III admitted to the hospital on 12/05/2017 with dysarthria.  MRI did not show any strokes.  Hospital course complicated by failure to thrive, inability to eat status post PEG tube placement.  Subjective: -Alert this morning, quite confused pulling on things.  Does not answer most questions but tells me that light is bothering her  Assessment & Plan: Principal Problem:   Dysarthria Active Problems:   Diabetes mellitus ([place])   Hyperlipidemia   Essential hypertension   Coronary atherosclerosis   Stroke (cerebrum) ([place])   Stage 3 chronic kidney disease ([place])   Acute lower UTI   Adult failure to thrive   UTI (urinary tract infection)   DNR (do not resuscitate) discussion   Palliative care by specialist   CHF (congestive heart failure) ([place])   Aspiration pneumonia due to gastric secretions ([place])   NSTEMI (non-ST elevated myocardial infarction) ([place])   Acute respiratory failure with hypoxia secondary to pulmonary edema, acute combined systolic and diastolic CHF -On 37/62, patient had rapid response with acute pulmonary edema, likely due to volume overload, BNP elevated, placed on IV Lasix.  Patient was found to be 27 L positive, had been receiving tube feeds, free water and IV fluids during hospitalization. Repeat echo showed EF of 30% with grade 2 diastolic dysfunction, troponins trended up to 12.1 -Prolonged hospitalization with episodes of agitation, respiratory distress, aspiration.  Patient had acute respiratory distress on 12/25/2017.   -She is slightly volume up as she is third spacing a lot, intravascularly she appears euvolemic  [place] -Intermittent, ENT consulted,  discussed with Dr. Wilburn Cornelia, no significant findings on flexible laryngoscopy except for copious dried secretions in the nasopharynx, supraglottis and hypopharynx -For most part this is resolved and I have not heard in the last couple of days  NSTEMI possibly due to volume overload/acute HF -Troponins elevated at 8.87, trended up to 12.1, patient was placed on IV heparin drip and cardiology was consulted. -Recent 2D echo on 11/09/2017 had shown EF of 35 to 50% with grade 1 diastolic dysfunction. Repeat echo showed EF of 30% with grade 2 diastolic dysfunction, diffuse hypokinesis -She was initially on heparin as well as nitroglycerin infusion, these were discontinued and currently she is on nitroglycerin paste.  We need to restart her home indoor later on.  Discussed with cardiology.  Acute metabolic encephalopathy -Patient had presented with dysarthria, confusion.  After discussion with the patient's son (physician), he states that patient does not have dementia.  -MRI of the brain showed chronic advanced atherosclerosis disease, chronic lacunar infarcts however no new infarct. -UA was positive 11/5, had shown more than 100,000 colonies of Enterococcus faecalis.  Repeat urine culture on 11/13, showed no growth -LFTs, ammonia level normal.  Neurology evaluation done on 11/20, seen by Dr. Cheral Marker, likely patient has vascular dementia due to chronic infarcts and chronic atherosclerotic disease. -She is on low-dose prednisone, continue for a few days and attempt to wean off by Monday  Aspiration PNA -Patient was started on IV vancomycin and Zosyn on 11/24 -Aspiration precautions.   -Discontinue vancomycin today maintain on Zosyn, 1 additional day remaining  Anemia: Acute on chronic -Baseline appears to be 7.5-8.  FOBT negative, not far from her baseline of 7.5.  May have been hemodilution. -Epo level is slightly above normal, received 2 units packed RBC transfusion for hemoglobin of 7.3 on  11/23 given NSTEMI and demand ischemia.  Heparin drip was discontinued after 48 hours. -H&H stable  Diabetes mellitus (HCC) type II, uncontrolled -Continue sliding scale insulin to every 4 hours -Change Levemir to split dose, 10 units twice daily  -Fasting 89 this morning, keep on same regimen  Failure to thrive, status post PEG tube, dysphagia -Off TPN -High residuals on tube feeds, placed on scheduled Reglan and restart tube feeds at low-dose. -Continue tube feeds  Pressure injury -Stage II partial-thickness with shallow open ulcer, red-pink wound bed with slough, medial sacrum, present on admission -Wound care per nursing  Subcutaneous nodule; non-incarcerated umbilical hernia probably lymph node -Seen by surgery, and recommended no surgical treatment at this time  Acute kidney injury on CKD stage III -Baseline creatinine 1.3-1.4 -Creatinine now slowly improving, 2.2 this morning   Scheduled Meds: . sodium chloride   Intravenous Once  . alteplase  2 mg Intracatheter Once  . atorvastatin  40 mg Per Tube q1800  . cholecalciferol  400 Units Per Tube Daily  . clopidogrel  75 mg Per Tube Daily  . ferrous sulfate  300 mg Per Tube Daily  . free water  135 mL Per Tube Q8H  . hydrALAZINE  10 mg Oral Q8H  . insulin aspart  0-15 Units Subcutaneous Q4H  . insulin detemir  10 Units Subcutaneous BID  . levalbuterol  0.63 mg Nebulization TID   And  . ipratropium  0.5 mg Nebulization TID  . metoCLOPramide (REGLAN) injection  5 mg Intravenous Q6H  . metoprolol tartrate  75 mg Oral BID  . mirtazapine  7.5 mg Per Tube QHS  . multivitamin  15 mL Per Tube Daily  . nitroGLYCERIN  1 inch Topical Q6H  . pantoprazole sodium  40 mg Per Tube BID  . predniSONE  20 mg Oral Q breakfast  . sennosides  5 mL Per Tube QHS   Continuous Infusions: . feeding supplement (JEVITY 1.2 CAL) 30 mL/hr at 12/29/17 1817  . piperacillin-tazobactam (ZOSYN)  IV 3.375 g (12/30/17 0928)   PRN  Meds:.acetaminophen, fentaNYL, hydrALAZINE, levalbuterol, loperamide HCl, LORazepam, morphine injection, nitroGLYCERIN, ondansetron (ZOFRAN) IV, sodium chloride flush  DVT prophylaxis: SCDs Code Status: Full code Family Communication: discussed with daughter at bedside and called son over the phone Disposition Plan: To be determined  Consultants:   Cardiology   ENT  Palliative   Procedures:   2D echo:  Impressions: - Normal LV size with mild LV hypertrophy. EF 30%, diffuse hypokinesis. Moderate diastolic dysfunction. Normal RV size with mildly decreased systolic function. Mild to moderate MR.   Antimicrobials:  Vancomycin 11/24 >> 11/28  Zosyn 11/24 >>  Objective: Vitals:   12/30/17 0513 12/30/17 0730 12/30/17 0814 12/30/17 1155  BP: (!) 136/54 135/69  125/64  Pulse: 87 85  (!) 101  Resp:  13  14  Temp: 98.7 F (37.1 C) 99.1 F (37.3 C)  98.9 F (37.2 C)  TempSrc: Axillary Axillary  Axillary  SpO2: 100% 100% 100% 100%  Weight:      Height:        Intake/Output Summary (Last 24 hours) at 12/30/2017 1329 Last data filed at 12/30/2017 0515 Gross per 24 hour  Intake 0 ml  Output 1350 ml  Net -1350 ml   Filed Weights   12/27/17 0500 12/28/17 0452 12/29/17 0300  Weight: 61.2 kg 58.1 kg 58 kg  Examination:  Constitutional: Agitated Eyes: No scleral icterus ENMT: Dry mucous membranes Neck: Supple Respiratory: Clear on anterior auscultation, shallow breathing, no wheezing or crackles heard Cardiovascular: Regular, no murmurs appreciated.  Trace edema Abdomen: Soft, NT, ND, positive bowel sounds, PEG tube in place Skin: No rashes Neurologic: Nonfocal, moves all 4 extremities, follows commands intermittently to the   Data Reviewed: I have independently reviewed following labs and imaging studies   CBC: Recent Labs  Lab 12/26/17 0519 12/27/17 0400 12/28/17 0502 12/29/17 0406 12/30/17 0627  WBC 16.1* 13.0* 12.8* 9.3 9.7  HGB 9.4* 8.7* 9.1* 8.6*  8.6*  HCT 30.9* 27.7* 29.8* 28.7* 29.0*  MCV 92.5 92.3 93.7 93.2 95.4  PLT 332 285 276 276 630   Basic Metabolic Panel: Recent Labs  Lab 12/25/17 1331 12/26/17 0519 12/27/17 0400 12/28/17 0502 12/29/17 0406 12/30/17 0627  NA 136 137 137 139 139 143  K 5.0 5.0 5.7* 5.1 4.1 3.9  CL 99 99 100 99 101 100  CO2 29 28 29 30  32 32  GLUCOSE 216* 205* 180* 185* 127* 98  BUN 56* 63* 70* 68* 65* 56*  CREATININE 2.28* 2.52* 2.64* 2.67* 2.41* 2.21*  CALCIUM 9.5 9.4 8.8* 8.8* 8.1* 8.1*  MG 2.0  --   --   --   --   --    GFR: Estimated Creatinine Clearance: 14.6 mL/min (A) (by C-G formula based on SCr of 2.21 mg/dL (H)). Liver Function Tests: Recent Labs  Lab 12/27/17 0400  ALBUMIN 1.7*   No results for input(s): LIPASE, AMYLASE in the last 168 hours. No results for input(s): AMMONIA in the last 168 hours. Coagulation Profile: No results for input(s): INR, PROTIME in the last 168 hours. Cardiac Enzymes: Recent Labs  Lab 12/23/17 2200 12/24/17 0428 12/24/17 1015 12/24/17 1646  TROPONINI 2.13* 3.17* 3.29* 3.08*   BNP (last 3 results) No results for input(s): PROBNP in the last 8760 hours. HbA1C: No results for input(s): HGBA1C in the last 72 hours. CBG: Recent Labs  Lab 12/29/17 1946 12/30/17 0049 12/30/17 0509 12/30/17 0732 12/30/17 1151  GLUCAP 162* 128* 110* 89 99   Lipid Profile: No results for input(s): CHOL, HDL, LDLCALC, TRIG, CHOLHDL, LDLDIRECT in the last 72 hours. Thyroid Function Tests: No results for input(s): TSH, T4TOTAL, FREET4, T3FREE, THYROIDAB in the last 72 hours. Anemia Panel: No results for input(s): VITAMINB12, FOLATE, FERRITIN, TIBC, IRON, RETICCTPCT in the last 72 hours. Urine analysis:    Component Value Date/Time   COLORURINE YELLOW 12/23/2017 1324   APPEARANCEUR CLOUDY (A) 12/23/2017 1324   LABSPEC 1.010 12/23/2017 1324   PHURINE 7.0 12/23/2017 1324   GLUCOSEU NEGATIVE 12/23/2017 1324   HGBUR MODERATE (A) 12/23/2017 1324   BILIRUBINUR  NEGATIVE 12/23/2017 1324   KETONESUR NEGATIVE 12/23/2017 1324   PROTEINUR 100 (A) 12/23/2017 1324   UROBILINOGEN 0.2 06/30/2011 1214   NITRITE NEGATIVE 12/23/2017 1324   LEUKOCYTESUR LARGE (A) 12/23/2017 1324   Sepsis Labs: Invalid input(s): PROCALCITONIN, LACTICIDVEN  Recent Results (from the past 240 hour(s))  Culture, blood (routine x 2)     Status: None   Collection Time: 12/22/17 12:31 PM  Result Value Ref Range Status   Specimen Description BLOOD LEFT ANTECUBITAL  Final   Special Requests   Final    BOTTLES DRAWN AEROBIC AND ANAEROBIC Blood Culture adequate volume   Culture   Final    NO GROWTH 5 DAYS Performed at [facility], [address] [place], [place] [address]  Report Status 12/27/2017 FINAL  Final  Culture, blood (routine x 2)     Status: None   Collection Time: 12/22/17 12:31 PM  Result Value Ref Range Status   Specimen Description BLOOD BLOOD LEFT HAND  Final   Special Requests   Final    BOTTLES DRAWN AEROBIC AND ANAEROBIC Blood Culture results may not be optimal due to an inadequate volume of blood received in culture bottles   Culture   Final    NO GROWTH 5 DAYS Performed at [facility], [place] [address] [place], [place] [address]    Report Status 12/27/2017 FINAL  Final  Urine Culture     Status: Abnormal   Collection Time: 12/23/17  1:24 PM  Result Value Ref Range Status   Specimen Description URINE, RANDOM  Final   Special Requests   Final    NONE Performed at [facility], [place] [address] [place], [place] [address]    Culture >=100,000 COLONIES/mL ENTEROCOCCUS FAECALIS (A)  Final   Report Status 12/26/2017 FINAL  Final   Organism ID, Bacteria ENTEROCOCCUS FAECALIS (A)  Final      Susceptibility   Enterococcus faecalis - MIC*    AMPICILLIN <=2 SENSITIVE Sensitive     LEVOFLOXACIN 1 SENSITIVE Sensitive     NITROFURANTOIN <=16 SENSITIVE Sensitive     VANCOMYCIN 1 SENSITIVE Sensitive     * >=100,000 COLONIES/mL ENTEROCOCCUS  FAECALIS      Radiology Studies: No results found.  Marzetta Board, MD, PhD [facility] Pager [phone]  If 7PM-7AM, please contact night-coverage [email or web address] Password [email or web address] 12/30/2017, 1:29 PM

## 2017-12-30 NOTE — Progress Notes (Addendum)
Progress Note  Patient Name: [name] Date of Encounter: 12/30/2017  Primary Cardiologist: Minus Breeding, MD   Subjective   Unable to assess. Daughter at bedside. Patient sleeping, respond to stimuli, did not wake up. Hand mitten on.   Inpatient Medications    Scheduled Meds: . sodium chloride   Intravenous Once  . alteplase  2 mg Intracatheter Once  . atorvastatin  40 mg Per Tube q1800  . cholecalciferol  400 Units Per Tube Daily  . clopidogrel  75 mg Per Tube Daily  . ferrous sulfate  300 mg Per Tube Daily  . free water  135 mL Per Tube Q8H  . hydrALAZINE  10 mg Oral Q8H  . insulin aspart  0-15 Units Subcutaneous Q4H  . insulin detemir  10 Units Subcutaneous BID  . levalbuterol  0.63 mg Nebulization TID   And  . ipratropium  0.5 mg Nebulization TID  . metoCLOPramide (REGLAN) injection  5 mg Intravenous Q6H  . metoprolol tartrate  75 mg Oral BID  . mirtazapine  7.5 mg Per Tube QHS  . multivitamin  15 mL Per Tube Daily  . nitroGLYCERIN  1 inch Topical Q6H  . pantoprazole sodium  40 mg Per Tube BID  . predniSONE  20 mg Oral Q breakfast  . sennosides  5 mL Per Tube QHS   Continuous Infusions: . feeding supplement (JEVITY 1.2 CAL) 30 mL/hr at 12/29/17 1817  . piperacillin-tazobactam (ZOSYN)  IV 3.375 g (12/30/17 0928)   PRN Meds: acetaminophen, fentaNYL, hydrALAZINE, levalbuterol, loperamide HCl, LORazepam, morphine injection, nitroGLYCERIN, ondansetron (ZOFRAN) IV, sodium chloride flush   Vital Signs    Vitals:   12/30/17 0200 12/30/17 0513 12/30/17 0730 12/30/17 0814  BP: (!) 131/56 (!) 136/54 135/69   Pulse:  87 85   Resp: 19  13   Temp:  98.7 F (37.1 C) 99.1 F (37.3 C)   TempSrc:  Axillary Axillary   SpO2: 100% 100% 100% 100%  Weight:      Height:        Intake/Output Summary (Last 24 hours) at 12/30/2017 1138 Last data filed at 12/30/2017 0515 Gross per 24 hour  Intake 0 ml  Output 1350 ml  Net -1350 ml   Filed Weights   12/27/17 0500  12/28/17 0452 12/29/17 0300  Weight: 61.2 kg 58.1 kg 58 kg    Telemetry    NSR, 1 episode of transient 6-8 beats NSVT - Personally Reviewed  ECG    NSR with diffuse TWI in lateral leads - Personally Reviewed  Physical Exam   GEN: Sleeping.   Neck: No JVD Cardiac: RRR, no murmurs, rubs, or gallops.  Respiratory: Clear to auscultation bilaterally. GI: Soft, nontender, non-distended  MS: No edema; No deformity. Neuro:  respond to stimuli, did not open eye Psych: Normal affect   Labs    Chemistry Recent Labs  Lab 12/27/17 0400 12/28/17 0502 12/29/17 0406 12/30/17 0627  NA 137 139 139 143  K 5.7* 5.1 4.1 3.9  CL 100 99 101 100  CO2 29 30 32 32  GLUCOSE 180* 185* 127* 98  BUN 70* 68* 65* 56*  CREATININE 2.64* 2.67* 2.41* 2.21*  CALCIUM 8.8* 8.8* 8.1* 8.1*  ALBUMIN 1.7*  --   --   --   GFRNONAA 16* 15* 17* 19*  GFRAA 18* 18* 20* 22*  ANIONGAP 8 10 6 11      Hematology Recent Labs  Lab 12/28/17 0502 12/29/17 0406 12/30/17 0627  WBC 12.8* 9.3 9.7  RBC 3.18* 3.08* 3.04*  HGB 9.1* 8.6* 8.6*  HCT 29.8* 28.7* 29.0*  MCV 93.7 93.2 95.4  MCH 28.6 27.9 28.3  MCHC 30.5 30.0 29.7*  RDW 16.6* 16.4* 16.8*  PLT 276 276 251    Cardiac Enzymes Recent Labs  Lab 12/23/17 2200 12/24/17 0428 12/24/17 1015 12/24/17 1646  TROPONINI 2.13* 3.17* 3.29* 3.08*   No results for input(s): TROPIPOC in the last 168 hours.   BNP Recent Labs  Lab 12/23/17 2200  BNP >4,500.0*     DDimer No results for input(s): DDIMER in the last 168 hours.   Radiology    No results found.  Cardiac Studies   Echo 12/21/2017 LV EF: 30% Study Conclusions  - Left ventricle: The cavity size was normal. Wall thickness was   increased in a pattern of mild LVH. The estimated ejection   fraction was 30%. Diffuse hypokinesis. Features are consistent   with a pseudonormal left ventricular filling pattern, with   concomitant abnormal relaxation and increased filling pressure   (grade 2  diastolic dysfunction). - Aortic valve: There was no stenosis. - Mitral valve: There was mild to moderate regurgitation. - Left atrium: The atrium was mildly dilated. - Right ventricle: The cavity size was normal. Systolic function   was mildly reduced. - Pulmonary arteries: No complete TR doppler jet so unable to   estimate PA systolic pressure. - Inferior vena cava: The vessel was normal in size. The   respirophasic diameter changes were in the normal range (>= 50%),   consistent with normal central venous pressure.  Impressions:  - Normal LV size with mild LV hypertrophy. EF 30%, diffuse   hypokinesis. Moderate diastolic dysfunction. Normal RV size with   mildly decreased systolic function. Mild to moderate MR.  Patient Profile     84 y.o. female with PMH of CAD s/p CABG 2011, CKD stage III (followed by Dr. Florene Glen), HTN, HLD, DM II, CVA in 08/2016, chronic combined systolic and diastolic heart failure who was admitted for AMS on 12/05/2017, neurology felt this is mixed underlying dementing process consisting with vascular dementia with Alzheimer's. She had worsening pulmonary edema on 12/21/2017. Trop drawn after the event 8.87 --> 12.17. Cardiology was consulted for elevated trop.   Assessment & Plan    1. Elevated trop  -Repeat echocardiogram obtained during this admission showed clear reduction in EF down to 35 to 40%.  There is no obvious regional patterns consistent with true ACS.  Elevated troponin likely suggested.  Diffuse underlying disease in this patient who had a acute heart failure.  - medical management, plavix restarted.   2. Acute respiratory failure/ acute combined systolic and diastolic heart failure  -With pulmonary edema, recent 25 pounds weight gain.  Dry weight is around 116 pounds.  - she appears to be euvolemic on today's exam. Weight and I/O not accurate  - On metoprolol, if BP allow later may restart home Imdur which will help with both CAD and achalasia.  She is on hydralazine  - I am hesitant to change her short acting metoprolol to long acting toprol given her labile condition, prefer to use the short acting medication.  3. AMS: Thought to be combination of underlying vascular dementia with Alzheimer's disease   -Palliative care consulted to discuss goals with family, family wanted full code at this time.  She come from a skilled nursing facility, however his son is planning to take her home after discharge.  4.  Achalasia: Developed swallowing difficulty after botox  injection for achalasia. She is currently on tube feed. On amlodipine.  5. Anemia: hgb stable  6. CAD s/p CABG 2011  7. CKD stage III  8. Hypertension  9. Failure to thrive:.  On tube feeding through PEG tube.  10. Enterococcus UTI: Treated with antibiotic.       For questions or updates, please contact [place] Please consult [email or web address] for contact info under        Signed, Almyra Deforest, [place]  12/30/2017, 11:38 AM    I have seen and examined this patient with Almyra Deforest.  Agree with above, note added to reflect my findings.  On exam, RRR, no murmurs, lungs clear. Patient sleeping, difficult to arouse. Comfortable. Patients BP well controlled currently. Likely has third pacing from HF and malnutrition. Has achalasia and is on amlodipine. Unfortunately, amlodipine can cause third spacing and thus this may be held if the patient is not expected to eat and use the PEG tube. Would continue metoprolol for HF. Currently on hydralazine and may start imdur as stage III CKD.  Will M. Camnitz MD 12/30/2017 12:29 PM

## 2017-12-30 NOTE — Progress Notes (Signed)
PT Cancellation Note  Patient Details Name: [name] MRN: [id number] DOB: December 25, 1927   Cancelled Treatment:     pt too lethargic to participate in therapy, will cont to follow.  Reinaldo Berber, PT, DPT Acute Rehabilitation Services Pager: [phone] Office: [place], [place], DPT Acute Rehabilitation Services Pager: [phone] Office: [phone]     Reinaldo Berber 12/30/2017, 1:14 PM

## 2017-12-31 ENCOUNTER — Inpatient Hospital Stay (HOSPITAL_COMMUNITY): Payer: Medicare Other

## 2017-12-31 LAB — CBC
HCT: 27.9 % — ABNORMAL LOW (ref 36.0–46.0)
Hemoglobin: 8.2 g/dL — ABNORMAL LOW (ref 12.0–15.0)
MCH: 28.4 pg (ref 26.0–34.0)
MCHC: 29.4 g/dL — ABNORMAL LOW (ref 30.0–36.0)
MCV: 96.5 fL (ref 80.0–100.0)
Platelets: 278 10*3/uL (ref 150–400)
RBC: 2.89 MIL/uL — ABNORMAL LOW (ref 3.87–5.11)
RDW: 17.1 % — ABNORMAL HIGH (ref 11.5–15.5)
WBC: 10.2 10*3/uL (ref 4.0–10.5)
nRBC: 0 % (ref 0.0–0.2)

## 2017-12-31 LAB — BLOOD GAS, ARTERIAL
Acid-Base Excess: 4.4 mmol/L — ABNORMAL HIGH (ref 0.0–2.0)
Bicarbonate: 28.2 mmol/L — ABNORMAL HIGH (ref 20.0–28.0)
O2 CONTENT: 2 L/min
O2 SAT: 89.8 %
Patient temperature: 98.6
pCO2 arterial: 40.5 mmHg (ref 32.0–48.0)
pH, Arterial: 7.457 — ABNORMAL HIGH (ref 7.350–7.450)
pO2, Arterial: 56.2 mmHg — ABNORMAL LOW (ref 83.0–108.0)

## 2017-12-31 LAB — BASIC METABOLIC PANEL
Anion gap: 11 (ref 5–15)
BUN: 51 mg/dL — ABNORMAL HIGH (ref 8–23)
CO2: 31 mmol/L (ref 22–32)
Calcium: 7.9 mg/dL — ABNORMAL LOW (ref 8.9–10.3)
Chloride: 102 mmol/L (ref 98–111)
Creatinine, Ser: 2.1 mg/dL — ABNORMAL HIGH (ref 0.44–1.00)
GFR calc Af Amer: 24 mL/min — ABNORMAL LOW (ref >60)
GFR calc non Af Amer: 20 mL/min — ABNORMAL LOW (ref >60)
GLUCOSE: 117 mg/dL — AB (ref 70–99)
Potassium: 3.8 mmol/L (ref 3.5–5.1)
Sodium: 144 mmol/L (ref 135–145)

## 2017-12-31 LAB — GLUCOSE, CAPILLARY
Glucose-Capillary: 103 mg/dL — ABNORMAL HIGH (ref 70–99)
Glucose-Capillary: 104 mg/dL — ABNORMAL HIGH (ref 70–99)
Glucose-Capillary: 130 mg/dL — ABNORMAL HIGH (ref 70–99)
Glucose-Capillary: 135 mg/dL — ABNORMAL HIGH (ref 70–99)
Glucose-Capillary: 147 mg/dL — ABNORMAL HIGH (ref 70–99)
Glucose-Capillary: 178 mg/dL — ABNORMAL HIGH (ref 70–99)

## 2017-12-31 LAB — LACTIC ACID, PLASMA: Lactic Acid, Venous: 1.9 mmol/L (ref 0.5–1.9)

## 2017-12-31 MED ORDER — METOPROLOL TARTRATE 5 MG/5ML IV SOLN
5.0000 mg | INTRAVENOUS | Status: AC | PRN
  Administered 2017-12-31 (×2): 5 mg via INTRAVENOUS
  Filled 2017-12-31 (×2): qty 5

## 2017-12-31 MED ORDER — IPRATROPIUM BROMIDE 0.02 % IN SOLN
0.5000 mg | Freq: Two times a day (BID) | RESPIRATORY_TRACT | Status: DC
  Administered 2017-12-31 – 2018-01-24 (×47): 0.5 mg via RESPIRATORY_TRACT
  Filled 2017-12-31 (×51): qty 2.5

## 2017-12-31 MED ORDER — QUETIAPINE FUMARATE 25 MG PO TABS
12.5000 mg | ORAL_TABLET | Freq: Two times a day (BID) | ORAL | Status: DC
  Administered 2017-12-31: 12.5 mg via ORAL
  Filled 2017-12-31 (×2): qty 1

## 2017-12-31 MED ORDER — LEVALBUTEROL HCL 0.63 MG/3ML IN NEBU
0.6300 mg | INHALATION_SOLUTION | Freq: Two times a day (BID) | RESPIRATORY_TRACT | Status: DC
  Administered 2017-12-31 – 2018-01-24 (×47): 0.63 mg via RESPIRATORY_TRACT
  Filled 2017-12-31 (×51): qty 3

## 2017-12-31 MED ORDER — PREDNISONE 10 MG PO TABS
10.0000 mg | ORAL_TABLET | Freq: Every day | ORAL | Status: DC
  Administered 2018-01-01: 10 mg via ORAL
  Filled 2017-12-31: qty 1

## 2017-12-31 MED ORDER — ACETAMINOPHEN 500 MG PO TABS: 500.0000 mg | ORAL_TABLET | Freq: Four times a day (QID) | ORAL | Status: DC | PRN

## 2017-12-31 MED ORDER — IPRATROPIUM BROMIDE 0.02 % IN SOLN: 0.5000 mg | Freq: Two times a day (BID) | RESPIRATORY_TRACT | Status: DC

## 2017-12-31 MED ORDER — LEVALBUTEROL HCL 0.63 MG/3ML IN NEBU: 0.6300 mg | INHALATION_SOLUTION | Freq: Three times a day (TID) | RESPIRATORY_TRACT | Status: DC

## 2017-12-31 NOTE — Significant Event (Signed)
Rapid Response Event Note Called by nursing staff for pt with increased WOB and tachycardia/tachypnea  Overview: Time Called: 2011 Arrival Time: 2015 Event Type: Respiratory, Other (Comment)(prob sepsis)  Initial Focused Assessment: Upon arrival, [name] would only arouse to physical stimulus and moan in response.  She would grimace with movement which appears to be a response to pain.  PTA she received Ativan, lopressor x2 and hydralazine and a EKG.  She appears in significant respiratory distress however her oxygen sats are 98% on 2LNC.  Fever is 101.1 F rectally which could be driving her tachypnea.  HR 122 ST, BP 169/109, RR 34 with accessory muscle use.  BBS Rhonchus but decent air movement.  Will treat with tylenol.  Morphine prn will be used for pain and afterload reduction.  Will concentrate on controlling fever and morphine for pain to reduce demand.  Will check PCXR and ABG.  Interventions: -Stat PCXR -ABG, lactic acid -Tylenol prn -Morphine prn  Plan of Care (if not transferred): -Treat Fever, control pain to reduce cardiac workload -Will follow up with PCXR results and ABG results. -BIPAP prn if WOB not improved with decreasing fever and treating pain   Event Summary: Name of Physician Notified: X. Blount APP at 2106    at    Outcome: Stayed in room and stabalized   [name]

## 2017-12-31 NOTE — Progress Notes (Signed)
PROGRESS NOTE  C[place]ow BTD:[id number] DOB: Apr 09, 1927 DOA: 12/05/2017 PCP: Deland Pretty, MD   LOS: 22 days   Brief Narrative / Interim history: 84 year old female history of diabetes type 2, hypertension, hyperlipidemia, coronary artery disease with history of CABG, prior CVA, chronic kidney disease stage III admitted to the hospital on 12/05/2017 with dysarthria.  MRI did not show any strokes.  Hospital course complicated by failure to thrive, inability to eat status post PEG tube placement.  Subjective: -Lethargic this morning  Assessment & Plan: Principal Problem:   Dysarthria Active Problems:   Diabetes mellitus ([place])   Hyperlipidemia   Essential hypertension   Coronary atherosclerosis   Stroke (cerebrum) (HCC)   Stage 3 chronic kidney disease ([place])   Acute lower UTI   Adult failure to thrive   UTI (urinary tract infection)   DNR (do not resuscitate) discussion   Palliative care by specialist   CHF (congestive heart failure) ([place])   Aspiration pneumonia due to gastric secretions ([place])   NSTEMI (non-ST elevated myocardial infarction) ([place])   Acute respiratory failure with hypoxia secondary to pulmonary edema, acute combined systolic and diastolic CHF -On 10/62, patient had rapid response with acute pulmonary edema, likely due to volume overload, BNP elevated, placed on IV Lasix.  Patient was found to be 27 L positive, had been receiving tube feeds, free water and IV fluids during hospitalization. Repeat echo showed EF of 30% with grade 2 diastolic dysfunction, troponins trended up to 12.1 -Prolonged hospitalization with episodes of agitation, respiratory distress, aspiration.  Patient had acute respiratory distress on 12/25/2017.   -Volume status looks better with a degree of third spacing  Stridor -Intermittent, ENT consulted, discussed with Dr. Wilburn Cornelia, no significant findings on flexible laryngoscopy except for copious dried secretions in the nasopharynx,  supraglottis and hypopharynx -Have not appreciated any more stridor in the last days  NSTEMI possibly due to volume overload/acute HF -Troponins elevated at 8.87, trended up to 12.1, patient was placed on IV heparin drip and cardiology was consulted. -Recent 2D echo on 11/09/2017 had shown EF of 35 to 50% with grade 1 diastolic dysfunction. Repeat echo showed EF of 30% with grade 2 diastolic dysfunction, diffuse hypokinesis -She was initially on heparin as well as nitroglycerin infusion, these were discontinued and currently she is on nitroglycerin paste.  We need to restart her home indoor later on. -Appreciate cardiology follow-up  Acute metabolic encephalopathy -Patient had presented with dysarthria, confusion.  After discussion with the patient's son (physician), he states that patient does not have dementia.  -MRI of the brain showed chronic advanced atherosclerosis disease, chronic lacunar infarcts however no new infarct. -UA was positive 11/5, had shown more than 100,000 colonies of Enterococcus faecalis.  Repeat urine culture on 11/13, showed no growth -LFTs, ammonia level normal.  Neurology evaluation done on 11/20, seen by Dr. Cheral Marker, likely patient has vascular dementia due to chronic infarcts and chronic atherosclerotic disease. -She is on low-dose prednisone, decrease the dose to 10 mg daily  Aspiration PNA -Patient was started on IV vancomycin and Zosyn on 11/24, now monitor off antibiotics as she finished a course -Aspiration precautions.    Anemia: Acute on chronic -Baseline appears to be 7.5-8.  FOBT negative, not far from her baseline of 7.5.  May have been hemodilution. -Epo level is slightly above normal, received 2 units packed RBC transfusion for hemoglobin of 7.3 on 11/23 given NSTEMI and demand ischemia.  Heparin drip was discontinued after 48 hours. -H&H stable  Diabetes  mellitus ([place]) type II, uncontrolled -Continue sliding scale insulin to every 4  hours -Change Levemir to split dose, 10 units twice daily  -Continue current regimen, will increase feeding today and monitor  Failure to thrive, status post PEG tube, dysphagia -Off TPN -Continue tube feeds, increase feeding today and see if she tolerates  Pressure injury -Stage II partial-thickness with shallow open ulcer, red-pink wound bed with slough, medial sacrum, present on admission -Wound care per nursing  Subcutaneous nodule; non-incarcerated umbilical hernia probably lymph node -Seen by surgery, and recommended no surgical treatment at this time  Acute kidney injury on CKD stage III -Baseline creatinine 1.3-1.4 -Creatinine continues improvement at 2.1 this morning   Scheduled Meds: . sodium chloride   Intravenous Once  . alteplase  2 mg Intracatheter Once  . atorvastatin  40 mg Per Tube q1800  . cholecalciferol  400 Units Per Tube Daily  . clopidogrel  75 mg Per Tube Daily  . ferrous sulfate  300 mg Per Tube Daily  . free water  135 mL Per Tube Q8H  . hydrALAZINE  10 mg Oral Q8H  . insulin aspart  0-15 Units Subcutaneous Q4H  . insulin detemir  10 Units Subcutaneous BID  . levalbuterol  0.63 mg Nebulization BID   And  . ipratropium  0.5 mg Nebulization BID  . metoCLOPramide (REGLAN) injection  5 mg Intravenous Q6H  . metoprolol tartrate  75 mg Oral BID  . mirtazapine  7.5 mg Per Tube QHS  . multivitamin  15 mL Per Tube Daily  . nitroGLYCERIN  1 inch Topical Q6H  . pantoprazole sodium  40 mg Per Tube BID  . predniSONE  20 mg Oral Q breakfast  . QUEtiapine  12.5 mg Oral BID  . sennosides  5 mL Per Tube QHS   Continuous Infusions: . feeding supplement (JEVITY 1.2 CAL) 1,000 mL (12/30/17 2039)   PRN Meds:.acetaminophen, fentaNYL, hydrALAZINE, levalbuterol, loperamide HCl, LORazepam, morphine injection, nitroGLYCERIN, ondansetron (ZOFRAN) IV, sodium chloride flush  DVT prophylaxis: SCDs Code Status: Full code Family Communication: discussed with daughter  at bedside and called son over the phone Disposition Plan: To be determined  Consultants:   Cardiology   ENT  Palliative   Procedures:   2D echo:  Impressions: - Normal LV size with mild LV hypertrophy. EF 30%, diffuse hypokinesis. Moderate diastolic dysfunction. Normal RV size with mildly decreased systolic function. Mild to moderate MR.   Antimicrobials:  Vancomycin 11/24 >> 11/28  Zosyn 11/24 >> 12/1  Objective: Vitals:   12/31/17 0800 12/31/17 0849 12/31/17 1112 12/31/17 1207  BP: (!) 142/64 (!) 142/64 (!) 158/78 (!) 155/90  Pulse: (!) 105 (!) 101 85   Resp: (!) 24 20 (!) 24   Temp: 98.8 F (37.1 C)  99.8 F (37.7 C)   TempSrc: Oral  Axillary   SpO2: 100% 100% 100%   Weight:      Height:        Intake/Output Summary (Last 24 hours) at 12/31/2017 1413 Last data filed at 12/31/2017 0650 Gross per 24 hour  Intake 100.5 ml  Output 600 ml  Net -499.5 ml   Filed Weights   12/28/17 0452 12/29/17 0300 12/31/17 0600  Weight: 58.1 kg 58 kg 59 kg    Examination:  Constitutional: Lethargic Eyes: No icterus ENMT: Dry mucous membranes Respiratory: Overall clear without wheezing or crackles, shallow respiratory effort Cardiovascular: Regular rate and rhythm, no murmurs appreciated, trace lower extremity edema Abdomen: Soft, nontender, nondistended, positive bowel sounds, PEG  tube in place Skin: No rashes seen Neurologic: Lethargic today   Data Reviewed: I have independently reviewed following labs and imaging studies   CBC: Recent Labs  Lab 12/27/17 0400 12/28/17 0502 12/29/17 0406 12/30/17 0627 12/31/17 0645  WBC 13.0* 12.8* 9.3 9.7 10.2  HGB 8.7* 9.1* 8.6* 8.6* 8.2*  HCT 27.7* 29.8* 28.7* 29.0* 27.9*  MCV 92.3 93.7 93.2 95.4 96.5  PLT 285 276 276 251 235   Basic Metabolic Panel: Recent Labs  Lab 12/25/17 1331  12/27/17 0400 12/28/17 0502 12/29/17 0406 12/30/17 0627 12/31/17 0645  NA 136   < > 137 139 139 143 144  K 5.0   < > 5.7* 5.1 4.1  3.9 3.8  CL 99   < > 100 99 101 100 102  CO2 29   < > 29 30 32 32 31  GLUCOSE 216*   < > 180* 185* 127* 98 117*  BUN 56*   < > 70* 68* 65* 56* 51*  CREATININE 2.28*   < > 2.64* 2.67* 2.41* 2.21* 2.10*  CALCIUM 9.5   < > 8.8* 8.8* 8.1* 8.1* 7.9*  MG 2.0  --   --   --   --   --   --    < > = values in this interval not displayed.   GFR: Estimated Creatinine Clearance: 15.3 mL/min (A) (by C-G formula based on SCr of 2.1 mg/dL (H)). Liver Function Tests: Recent Labs  Lab 12/27/17 0400  ALBUMIN 1.7*   No results for input(s): LIPASE, AMYLASE in the last 168 hours. No results for input(s): AMMONIA in the last 168 hours. Coagulation Profile: No results for input(s): INR, PROTIME in the last 168 hours. Cardiac Enzymes: Recent Labs  Lab 12/24/17 1646  TROPONINI 3.08*   BNP (last 3 results) No results for input(s): PROBNP in the last 8760 hours. HbA1C: No results for input(s): HGBA1C in the last 72 hours. CBG: Recent Labs  Lab 12/30/17 1949 12/31/17 0007 12/31/17 0412 12/31/17 0834 12/31/17 1110  GLUCAP 140* 130* 135* 104* 147*   Lipid Profile: No results for input(s): CHOL, HDL, LDLCALC, TRIG, CHOLHDL, LDLDIRECT in the last 72 hours. Thyroid Function Tests: No results for input(s): TSH, T4TOTAL, FREET4, T3FREE, THYROIDAB in the last 72 hours. Anemia Panel: No results for input(s): VITAMINB12, FOLATE, FERRITIN, TIBC, IRON, RETICCTPCT in the last 72 hours. Urine analysis:    Component Value Date/Time   COLORURINE YELLOW 12/23/2017 1324   APPEARANCEUR CLOUDY (A) 12/23/2017 1324   LABSPEC 1.010 12/23/2017 1324   PHURINE 7.0 12/23/2017 1324   GLUCOSEU NEGATIVE 12/23/2017 1324   HGBUR MODERATE (A) 12/23/2017 1324   BILIRUBINUR NEGATIVE 12/23/2017 1324   KETONESUR NEGATIVE 12/23/2017 1324   PROTEINUR 100 (A) 12/23/2017 1324   UROBILINOGEN 0.2 06/30/2011 1214   NITRITE NEGATIVE 12/23/2017 1324   LEUKOCYTESUR LARGE (A) 12/23/2017 1324   Sepsis Labs: Invalid input(s):  PROCALCITONIN, LACTICIDVEN  Recent Results (from the past 240 hour(s))  Culture, blood (routine x 2)     Status: None   Collection Time: 12/22/17 12:31 PM  Result Value Ref Range Status   Specimen Description BLOOD LEFT ANTECUBITAL  Final   Special Requests   Final    BOTTLES DRAWN AEROBIC AND ANAEROBIC Blood Culture adequate volume   Culture   Final    NO GROWTH 5 DAYS Performed at [facility], [address] [place], [place] [address]    Report Status 12/27/2017 FINAL  Final  Culture, blood (routine x  2)     Status: None   Collection Time: 12/22/17 12:31 PM  Result Value Ref Range Status   Specimen Description BLOOD BLOOD LEFT HAND  Final   Special Requests   Final    BOTTLES DRAWN AEROBIC AND ANAEROBIC Blood Culture results may not be optimal due to an inadequate volume of blood received in culture bottles   Culture   Final    NO GROWTH 5 DAYS Performed at [facility], [place] [address], [place], [place] [address]    Report Status 12/27/2017 FINAL  Final  Urine Culture     Status: Abnormal   Collection Time: 12/23/17  1:24 PM  Result Value Ref Range Status   Specimen Description URINE, RANDOM  Final   Special Requests   Final    NONE Performed at [facility], [place] [address] [place], [place] [address]    Culture >=100,000 COLONIES/mL ENTEROCOCCUS FAECALIS (A)  Final   Report Status 12/26/2017 FINAL  Final   Organism ID, Bacteria ENTEROCOCCUS FAECALIS (A)  Final      Susceptibility   Enterococcus faecalis - MIC*    AMPICILLIN <=2 SENSITIVE Sensitive     LEVOFLOXACIN 1 SENSITIVE Sensitive     NITROFURANTOIN <=16 SENSITIVE Sensitive     VANCOMYCIN 1 SENSITIVE Sensitive     * >=100,000 COLONIES/mL ENTEROCOCCUS FAECALIS      Radiology Studies: No results found.  Marzetta Board, MD, PhD [facility] Pager [phone]  If 7PM-7AM, please contact night-coverage [email or web address] Password [email or web address] 12/31/2017, 2:13 PM

## 2018-01-01 ENCOUNTER — Inpatient Hospital Stay (HOSPITAL_COMMUNITY): Payer: Medicare Other

## 2018-01-01 DIAGNOSIS — J9600 Acute respiratory failure, unspecified whether with hypoxia or hypercapnia: Secondary | ICD-10-CM

## 2018-01-01 DIAGNOSIS — J9601 Acute respiratory failure with hypoxia: Secondary | ICD-10-CM

## 2018-01-01 DIAGNOSIS — R4182 Altered mental status, unspecified: Secondary | ICD-10-CM

## 2018-01-01 DIAGNOSIS — R0603 Acute respiratory distress: Secondary | ICD-10-CM

## 2018-01-01 DIAGNOSIS — R0989 Other specified symptoms and signs involving the circulatory and respiratory systems: Secondary | ICD-10-CM

## 2018-01-01 DIAGNOSIS — N390 Urinary tract infection, site not specified: Secondary | ICD-10-CM

## 2018-01-01 LAB — TROPONIN I
Troponin I: 1.03 ng/mL (ref <0.03)
Troponin I: 0.94 ng/mL (ref <0.03)

## 2018-01-01 LAB — BLOOD GAS, VENOUS
Acid-Base Excess: 6.9 mmol/L — ABNORMAL HIGH (ref 0.0–2.0)
Bicarbonate: 31.3 mmol/L — ABNORMAL HIGH (ref 20.0–28.0)
FIO2: 50
LHR: 16 {breaths}/min
MECHVT: 410 mL
O2 Saturation: 58.4 %
PEEP: 5 cmH2O
Patient temperature: 98.6
pCO2, Ven: 48.3 mmHg (ref 44.0–60.0)
pH, Ven: 7.427 (ref 7.250–7.430)
pO2, Ven: 32.8 mmHg (ref 32.0–45.0)

## 2018-01-01 LAB — CBC
HCT: 32.7 % — ABNORMAL LOW (ref 36.0–46.0)
Hemoglobin: 9.6 g/dL — ABNORMAL LOW (ref 12.0–15.0)
MCH: 28.1 pg (ref 26.0–34.0)
MCHC: 29.4 g/dL — ABNORMAL LOW (ref 30.0–36.0)
MCV: 95.6 fL (ref 80.0–100.0)
Platelets: 355 10*3/uL (ref 150–400)
RBC: 3.42 MIL/uL — AB (ref 3.87–5.11)
RDW: 17 % — ABNORMAL HIGH (ref 11.5–15.5)
WBC: 16.1 10*3/uL — ABNORMAL HIGH (ref 4.0–10.5)
nRBC: 0 % (ref 0.0–0.2)

## 2018-01-01 LAB — GLUCOSE, CAPILLARY
Glucose-Capillary: 114 mg/dL — ABNORMAL HIGH (ref 70–99)
Glucose-Capillary: 119 mg/dL — ABNORMAL HIGH (ref 70–99)
Glucose-Capillary: 144 mg/dL — ABNORMAL HIGH (ref 70–99)
Glucose-Capillary: 180 mg/dL — ABNORMAL HIGH (ref 70–99)
Glucose-Capillary: 195 mg/dL — ABNORMAL HIGH (ref 70–99)
Glucose-Capillary: 60 mg/dL — ABNORMAL LOW (ref 70–99)
Glucose-Capillary: 61 mg/dL — ABNORMAL LOW (ref 70–99)
Glucose-Capillary: 65 mg/dL — ABNORMAL LOW (ref 70–99)
Glucose-Capillary: 77 mg/dL (ref 70–99)

## 2018-01-01 LAB — COMPREHENSIVE METABOLIC PANEL
ALT: 17 U/L (ref 0–44)
AST: 18 U/L (ref 15–41)
Albumin: 1.7 g/dL — ABNORMAL LOW (ref 3.5–5.0)
Alkaline Phosphatase: 47 U/L (ref 38–126)
Anion gap: 13 (ref 5–15)
BUN: 51 mg/dL — ABNORMAL HIGH (ref 8–23)
CO2: 29 mmol/L (ref 22–32)
CREATININE: 2.1 mg/dL — AB (ref 0.44–1.00)
Calcium: 8.4 mg/dL — ABNORMAL LOW (ref 8.9–10.3)
Chloride: 100 mmol/L (ref 98–111)
GFR calc Af Amer: 24 mL/min — ABNORMAL LOW (ref >60)
GFR calc non Af Amer: 20 mL/min — ABNORMAL LOW (ref >60)
Glucose, Bld: 173 mg/dL — ABNORMAL HIGH (ref 70–99)
Potassium: 4 mmol/L (ref 3.5–5.1)
Sodium: 142 mmol/L (ref 135–145)
Total Bilirubin: 0.9 mg/dL (ref 0.3–1.2)
Total Protein: 6.3 g/dL — ABNORMAL LOW (ref 6.5–8.1)

## 2018-01-01 LAB — BRAIN NATRIURETIC PEPTIDE: B Natriuretic Peptide: >4500 pg/mL — ABNORMAL HIGH (ref 0.0–100.0)

## 2018-01-01 LAB — PROCALCITONIN: Procalcitonin: 1.5 ng/mL

## 2018-01-01 LAB — LACTIC ACID, PLASMA
Lactic Acid, Venous: 1.3 mmol/L (ref 0.5–1.9)
Lactic Acid, Venous: 1 mmol/L (ref 0.5–1.9)

## 2018-01-01 MED ORDER — PIPERACILLIN-TAZOBACTAM IN DEX 2-0.25 GM/50ML IV SOLN
2.2500 g | Freq: Three times a day (TID) | INTRAVENOUS | Status: DC
  Administered 2018-01-01 – 2018-01-03 (×6): 2.25 g via INTRAVENOUS
  Filled 2018-01-01 (×9): qty 50

## 2018-01-01 MED ORDER — MIDAZOLAM HCL 2 MG/2ML IJ SOLN
1.0000 mg | INTRAMUSCULAR | Status: DC | PRN
  Administered 2018-01-04: 1 mg via INTRAVENOUS
  Filled 2018-01-01: qty 2

## 2018-01-01 MED ORDER — ORAL CARE MOUTH RINSE
15.0000 mL | OROMUCOSAL | Status: DC
  Administered 2018-01-01 – 2018-01-05 (×37): 15 mL via OROMUCOSAL

## 2018-01-01 MED ORDER — METHYLPREDNISOLONE SODIUM SUCC 40 MG IJ SOLR: 40.0000 mg | Freq: Every day | INTRAMUSCULAR | Status: DC

## 2018-01-01 MED ORDER — ETOMIDATE 2 MG/ML IV SOLN
10.0000 mg | Freq: Once | INTRAVENOUS | Status: AC
  Administered 2018-01-01: 10 mg via INTRAVENOUS

## 2018-01-01 MED ORDER — FUROSEMIDE 10 MG/ML IJ SOLN
20.0000 mg | Freq: Once | INTRAMUSCULAR | Status: AC
  Administered 2018-01-01: 20 mg via INTRAVENOUS
  Filled 2018-01-01: qty 2

## 2018-01-01 MED ORDER — CHLORHEXIDINE GLUCONATE 0.12% ORAL RINSE (MEDLINE KIT)
15.0000 mL | Freq: Two times a day (BID) | OROMUCOSAL | Status: DC
  Administered 2018-01-01 – 2018-01-05 (×9): 15 mL via OROMUCOSAL

## 2018-01-01 MED ORDER — DEXTROSE 50 % IV SOLN
25.0000 mL | Freq: Once | INTRAVENOUS | Status: AC
  Administered 2018-01-01: 25 mL via INTRAVENOUS

## 2018-01-01 MED ORDER — DEXTROSE 50 % IV SOLN
INTRAVENOUS | Status: AC
  Filled 2018-01-01: qty 50

## 2018-01-01 MED ORDER — PANTOPRAZOLE SODIUM 40 MG IV SOLR
40.0000 mg | Freq: Every day | INTRAVENOUS | Status: DC
  Administered 2018-01-01 – 2018-01-09 (×8): 40 mg via INTRAVENOUS
  Filled 2018-01-01 (×8): qty 40

## 2018-01-01 MED ORDER — FUROSEMIDE 10 MG/ML IJ SOLN
40.0000 mg | Freq: Two times a day (BID) | INTRAMUSCULAR | Status: DC
  Administered 2018-01-01 – 2018-01-03 (×5): 40 mg via INTRAVENOUS
  Filled 2018-01-01 (×5): qty 4

## 2018-01-01 MED ORDER — FENTANYL CITRATE (PF) 100 MCG/2ML IJ SOLN
50.0000 ug | Freq: Once | INTRAMUSCULAR | Status: AC
  Administered 2018-01-01: 50 ug via INTRAVENOUS

## 2018-01-01 MED ORDER — FENTANYL CITRATE (PF) 100 MCG/2ML IJ SOLN
INTRAMUSCULAR | Status: AC
  Administered 2018-01-01: 100 ug via INTRAVENOUS
  Filled 2018-01-01: qty 2

## 2018-01-01 MED ORDER — MIDAZOLAM HCL 2 MG/2ML IJ SOLN
1.0000 mg | INTRAMUSCULAR | Status: AC | PRN
  Administered 2018-01-04 (×3): 1 mg via INTRAVENOUS
  Filled 2018-01-01 (×4): qty 2

## 2018-01-01 MED ORDER — FENTANYL 2500MCG IN NS 250ML (10MCG/ML) PREMIX INFUSION
25.0000 ug/h | INTRAVENOUS | Status: DC
  Administered 2018-01-01: 50 ug/h via INTRAVENOUS
  Filled 2018-01-01 (×2): qty 250

## 2018-01-01 MED ORDER — MIDAZOLAM HCL 2 MG/2ML IJ SOLN
1.0000 mg | Freq: Once | INTRAMUSCULAR | Status: AC
  Administered 2018-01-01: 1 mg via INTRAVENOUS

## 2018-01-01 MED ORDER — DEXTROSE 50 % IV SOLN
INTRAVENOUS | Status: AC
  Administered 2018-01-01: 25 mL
  Filled 2018-01-01: qty 50

## 2018-01-01 MED ORDER — FENTANYL BOLUS VIA INFUSION
25.0000 ug | INTRAVENOUS | Status: DC | PRN
  Administered 2018-01-02 – 2018-01-03 (×2): 25 ug via INTRAVENOUS
  Filled 2018-01-01: qty 25

## 2018-01-01 MED ORDER — MIDAZOLAM HCL 2 MG/2ML IJ SOLN
INTRAMUSCULAR | Status: AC
  Administered 2018-01-01: 1 mg via INTRAVENOUS
  Filled 2018-01-01: qty 2

## 2018-01-01 MED ORDER — SODIUM CHLORIDE 0.9 % IV SOLN
Freq: Once | INTRAVENOUS | Status: AC
  Administered 2018-01-01: 13:00:00 via INTRAVENOUS

## 2018-01-01 MED ORDER — FENTANYL CITRATE (PF) 100 MCG/2ML IJ SOLN
100.0000 ug | Freq: Once | INTRAMUSCULAR | Status: AC
  Administered 2018-01-01: 100 ug via INTRAVENOUS

## 2018-01-01 MED ORDER — ROCURONIUM BROMIDE 50 MG/5ML IV SOLN
50.0000 mg | Freq: Once | INTRAVENOUS | Status: AC
  Administered 2018-01-01: 50 mg via INTRAVENOUS
  Filled 2018-01-01: qty 5

## 2018-01-01 NOTE — Progress Notes (Addendum)
PROGRESS NOTE  [name] HWE:[id number] DOB: July 25, 1927 DOA: 12/05/2017 PCP: Deland Pretty, MD   LOS: 23 days   Brief Narrative / Interim history: 84 year old female history of diabetes type 2, hypertension, hyperlipidemia, coronary artery disease with history of CABG, prior CVA, chronic kidney disease stage III admitted to the hospital on 12/05/2017 with dysarthria.  MRI did not show any strokes.  Hospital course complicated by failure to thrive, inability to eat status post PEG tube placement.  Subjective: -Overnight events noted, patient with increased work of breathing as well as fever.  She is lethargic this morning, tachypneic, not much responsive  Assessment & Plan: Principal Problem:   Dysarthria Active Problems:   Diabetes mellitus ([place])   Hyperlipidemia   Essential hypertension   Coronary atherosclerosis   Stroke (cerebrum) ([place])   Stage 3 chronic kidney disease ([place])   Acute lower UTI   Adult failure to thrive   UTI (urinary tract infection)   DNR (do not resuscitate) discussion   Palliative care by specialist   CHF (congestive heart failure) ([place])   Aspiration pneumonia due to gastric secretions ([place])   NSTEMI (non-ST elevated myocardial infarction) ([place])   Acute respiratory failure with hypoxia secondary to pulmonary edema, acute combined systolic and diastolic CHF -On 89/38, patient had rapid response with acute pulmonary edema, likely due to volume overload, BNP elevated, placed on IV Lasix.  Patient was found to be 27 L positive, had been receiving tube feeds, free water and IV fluids during hospitalization. Repeat echo showed EF of 30% with grade 2 diastolic dysfunction, troponins trended up to 12.1 -Prolonged hospitalization with episodes of agitation, respiratory distress, aspiration.  Patient had acute respiratory distress on 12/25/2017.   -Patient presented with episode of acute respiratory distress 12/1-12/2 nights, chest x-ray personally reviewed with  worsening pulmonary edema and slightly increased pleural effusions -Continue breathing treatments, give IV Lasix x1 given pulmonary edema, I have also restarted antibiotics given fever and concern for aspiration -Highly concerned about her declining respiratory status in the setting of poor mentation, I have consulted critical care and patient will be transferred to the ICU under critical care service and looks like she will be intubated  [place] -Intermittent, ENT consulted, discussed with Dr. Wilburn Cornelia, no significant findings on flexible laryngoscopy except for copious dried secretions in the nasopharynx, supraglottis and hypopharynx -This appears to have been resolved  NSTEMI possibly due to volume overload/acute HF -Troponins elevated at 8.87, trended up to 12.1, patient was placed on IV heparin drip and cardiology was consulted. -Recent 2D echo on 11/09/2017 had shown EF of 35 to 50% with grade 1 diastolic dysfunction. Repeat echo showed EF of 30% with grade 2 diastolic dysfunction, diffuse hypokinesis -She was initially on heparin as well as nitroglycerin infusion, these were discontinued and currently she is on nitroglycerin paste. -Appreciate cardiology follow-up, volume overload remains a problem, will resume Lasix, have also consulted nephrology  Acute kidney injury on CKD stage III-IV -Baseline creatinine 1.3-1.4 -Very tenuous renal function as her creatinine went up to 2.6 after being diuresed last time she went into flash pulmonary edema on 11/25. -Had recurrent of pulmonary edema on 12/1 and will resume IV Lasix today.  Due to tenuous renal function, I have consulted nephrology, discussed with Dr. Hollie Salk and appreciate input  Acute metabolic encephalopathy -Patient had presented with dysarthria, confusion.  After discussion with the patient's son (physician), he states that patient does not have dementia.  -MRI of the brain showed chronic advanced atherosclerosis disease,  chronic  lacunar infarcts however no new infarct. -UA was positive 11/5, had shown more than 100,000 colonies of Enterococcus faecalis.  Repeat urine culture on 11/13, showed no growth -LFTs, ammonia level normal.  Neurology evaluation done on 11/20, seen by Dr. Cheral Marker, likely patient has vascular dementia due to chronic infarcts and chronic atherosclerotic disease. -Remains persistently delirious due to acute illness  Aspiration PNA -Patient was started on IV vancomycin and Zosyn on 11/24, now monitor off antibiotics as she finished a course -Continues to aspirate likely she is aspirating her own secretions  Anemia: Acute on chronic -Baseline appears to be 7.5-8.  FOBT negative, not far from her baseline of 7.5.  May have been hemodilution. -Epo level is slightly above normal, received 2 units packed RBC transfusion for hemoglobin of 7.3 on 11/23 given NSTEMI and demand ischemia.  Heparin drip was discontinued after 48 hours. -H&H stable  Diabetes mellitus (HCC) type II, uncontrolled -Continue sliding scale insulin to every 4 hours -Change Levemir to split dose, 10 units twice daily  -Continue current regimen  Failure to thrive, status post PEG tube, dysphagia -Off TPN -Start tube feeds today given worsening respiratory status  Pressure injury -Stage II partial-thickness with shallow open ulcer, red-pink wound bed with slough, medial sacrum, present on admission -Wound care per nursing  Subcutaneous nodule; non-incarcerated umbilical hernia probably lymph node -Seen by surgery, and recommended no surgical treatment at this time     Scheduled Meds: . sodium chloride   Intravenous Once  . alteplase  2 mg Intracatheter Once  . atorvastatin  40 mg Per Tube q1800  . cholecalciferol  400 Units Per Tube Daily  . clopidogrel  75 mg Per Tube Daily  . ferrous sulfate  300 mg Per Tube Daily  . free water  135 mL Per Tube Q8H  . hydrALAZINE  10 mg Oral Q8H  . insulin aspart  0-15 Units  Subcutaneous Q4H  . insulin detemir  10 Units Subcutaneous BID  . levalbuterol  0.63 mg Nebulization BID   And  . ipratropium  0.5 mg Nebulization BID  . metoCLOPramide (REGLAN) injection  5 mg Intravenous Q6H  . metoprolol tartrate  75 mg Oral BID  . mirtazapine  7.5 mg Per Tube QHS  . multivitamin  15 mL Per Tube Daily  . nitroGLYCERIN  1 inch Topical Q6H  . pantoprazole sodium  40 mg Per Tube BID  . predniSONE  10 mg Oral Q breakfast  . QUEtiapine  12.5 mg Oral BID  . sennosides  5 mL Per Tube QHS   Continuous Infusions: . feeding supplement (JEVITY 1.2 CAL) Stopped (01/01/18 0315)  . piperacillin-tazobactam (ZOSYN)  IV     PRN Meds:.acetaminophen, fentaNYL, hydrALAZINE, levalbuterol, loperamide HCl, LORazepam, morphine injection, nitroGLYCERIN, ondansetron (ZOFRAN) IV, sodium chloride flush  DVT prophylaxis: SCDs Code Status: Full code Family Communication: discussed with daughter at bedside and called son over the phone Disposition Plan: To be determined  Consultants:   Cardiology   ENT  Palliative   Procedures:   2D echo:  Impressions: - Normal LV size with mild LV hypertrophy. EF 30%, diffuse hypokinesis. Moderate diastolic dysfunction. Normal RV size with mildly decreased systolic function. Mild to moderate MR.   Antimicrobials:  Vancomycin 11/24 >> 11/28  Zosyn 11/24 >> 12/1  Zosyn 12/2 >>  Objective: Vitals:   01/01/18 0500 01/01/18 0707 01/01/18 0816 01/01/18 0853  BP: (!) 157/85 (!) 154/91 (!) 163/99   Pulse:    (!) 121  Resp: Marland Kitchen)  30  (!) 24 (!) 25  Temp:   99.1 F (37.3 C)   TempSrc:   Rectal   SpO2: 98%  99% 96%  Weight:      Height:        Intake/Output Summary (Last 24 hours) at 01/01/2018 0919 Last data filed at 12/31/2017 2000 Gross per 24 hour  Intake -  Output 500 ml  Net -500 ml   Filed Weights   12/28/17 0452 12/29/17 0300 12/31/17 0600  Weight: 58.1 kg 58 kg 59 kg    Examination:  Constitutional: Lethargic,  tachypneic, Eyes: No scleral icterus seen ENMT: Dry mucous membranes Respiratory: Diffuse coarse breath sounds bilaterally, shallow respiratory effort, tachypneic and increased work of breathing Cardiovascular: Regular rate and rhythm, no murmurs appreciated, worsening lower extremity edema Abdomen: Soft, nontender, nondistended, positive bowel sounds, PEG tube in place Skin: No new rashes Neurologic: Remains lethargic   Data Reviewed: I have independently reviewed following labs and imaging studies   CBC: Recent Labs  Lab 12/28/17 0502 12/29/17 0406 12/30/17 0627 12/31/17 0645 01/01/18 0434  WBC 12.8* 9.3 9.7 10.2 16.1*  HGB 9.1* 8.6* 8.6* 8.2* 9.6*  HCT 29.8* 28.7* 29.0* 27.9* 32.7*  MCV 93.7 93.2 95.4 96.5 95.6  PLT 276 276 251 278 102   Basic Metabolic Panel: Recent Labs  Lab 12/25/17 1331  12/28/17 0502 12/29/17 0406 12/30/17 0627 12/31/17 0645 01/01/18 0434  NA 136   < > 139 139 143 144 142  K 5.0   < > 5.1 4.1 3.9 3.8 4.0  CL 99   < > 99 101 100 102 100  CO2 29   < > 30 32 32 31 29  GLUCOSE 216*   < > 185* 127* 98 117* 173*  BUN 56*   < > 68* 65* 56* 51* 51*  CREATININE 2.28*   < > 2.67* 2.41* 2.21* 2.10* 2.10*  CALCIUM 9.5   < > 8.8* 8.1* 8.1* 7.9* 8.4*  MG 2.0  --   --   --   --   --   --    < > = values in this interval not displayed.   GFR: Estimated Creatinine Clearance: 15.3 mL/min (A) (by C-G formula based on SCr of 2.1 mg/dL (H)). Liver Function Tests: Recent Labs  Lab 12/27/17 0400 01/01/18 0434  AST  --  18  ALT  --  17  ALKPHOS  --  47  BILITOT  --  0.9  PROT  --  6.3*  ALBUMIN 1.7* 1.7*   No results for input(s): LIPASE, AMYLASE in the last 168 hours. No results for input(s): AMMONIA in the last 168 hours. Coagulation Profile: No results for input(s): INR, PROTIME in the last 168 hours. Cardiac Enzymes: Recent Labs  Lab 01/01/18 0434  TROPONINI 1.03*   BNP (last 3 results) No results for input(s): PROBNP in the last 8760  hours. HbA1C: No results for input(s): HGBA1C in the last 72 hours. CBG: Recent Labs  Lab 12/31/17 1110 12/31/17 1610 12/31/17 1945 01/01/18 0052 01/01/18 0713  GLUCAP 147* 103* 178* 180* 119*   Lipid Profile: No results for input(s): CHOL, HDL, LDLCALC, TRIG, CHOLHDL, LDLDIRECT in the last 72 hours. Thyroid Function Tests: No results for input(s): TSH, T4TOTAL, FREET4, T3FREE, THYROIDAB in the last 72 hours. Anemia Panel: No results for input(s): VITAMINB12, FOLATE, FERRITIN, TIBC, IRON, RETICCTPCT in the last 72 hours. Urine analysis:    Component Value Date/Time   COLORURINE YELLOW 12/23/2017 1324   APPEARANCEUR  CLOUDY (A) 12/23/2017 1324   LABSPEC 1.010 12/23/2017 1324   PHURINE 7.0 12/23/2017 1324   GLUCOSEU NEGATIVE 12/23/2017 1324   HGBUR MODERATE (A) 12/23/2017 1324   BILIRUBINUR NEGATIVE 12/23/2017 1324   KETONESUR NEGATIVE 12/23/2017 1324   PROTEINUR 100 (A) 12/23/2017 1324   UROBILINOGEN 0.2 06/30/2011 1214   NITRITE NEGATIVE 12/23/2017 1324   LEUKOCYTESUR LARGE (A) 12/23/2017 1324   Sepsis Labs: Invalid input(s): PROCALCITONIN, LACTICIDVEN  Recent Results (from the past 240 hour(s))  Culture, blood (routine x 2)     Status: None   Collection Time: 12/22/17 12:31 PM  Result Value Ref Range Status   Specimen Description BLOOD LEFT ANTECUBITAL  Final   Special Requests   Final    BOTTLES DRAWN AEROBIC AND ANAEROBIC Blood Culture adequate volume   Culture   Final    NO GROWTH 5 DAYS Performed at [facility], [address], [place], [place] [address]    Report Status 12/27/2017 FINAL  Final  Culture, blood (routine x 2)     Status: None   Collection Time: 12/22/17 12:31 PM  Result Value Ref Range Status   Specimen Description BLOOD BLOOD LEFT HAND  Final   Special Requests   Final    BOTTLES DRAWN AEROBIC AND ANAEROBIC Blood Culture results may not be optimal due to an inadequate volume of blood received in culture bottles   Culture   Final     NO GROWTH 5 DAYS Performed at [facility], [place] [address] [place], [place] [address]    Report Status 12/27/2017 FINAL  Final  Urine Culture     Status: Abnormal   Collection Time: 12/23/17  1:24 PM  Result Value Ref Range Status   Specimen Description URINE, RANDOM  Final   Special Requests   Final    NONE Performed at [facility], [place] [address], [place], [place] [address]    Culture >=100,000 COLONIES/mL ENTEROCOCCUS FAECALIS (A)  Final   Report Status 12/26/2017 FINAL  Final   Organism ID, Bacteria ENTEROCOCCUS FAECALIS (A)  Final      Susceptibility   Enterococcus faecalis - MIC*    AMPICILLIN <=2 SENSITIVE Sensitive     LEVOFLOXACIN 1 SENSITIVE Sensitive     NITROFURANTOIN <=16 SENSITIVE Sensitive     VANCOMYCIN 1 SENSITIVE Sensitive     * >=100,000 COLONIES/mL ENTEROCOCCUS FAECALIS      Radiology Studies: Dg Chest Port 1 View  Result Date: 12/31/2017 CLINICAL DATA:  Initial evaluation for acute respiratory distress. EXAM: PORTABLE CHEST 1 VIEW COMPARISON:  Previous radiograph from 12/25/2017 FINDINGS: Median sternotomy wires underlying CABG markers and surgical clips, stable. Right PICC catheter in place with tip overlying distal SVC. Moderate cardiomegaly unchanged. Mediastinal silhouette within normal limits. Aortic atherosclerosis. Lungs mildly hypoinflated. Moderate diffuse pulmonary interstitial edema has slightly worsened from previous. Associated bibasilar layering pleural effusions, right greater than left, also worsened. Increased bibasilar opacities favored to reflect atelectasis and/or edema. No pneumothorax. Osseous structures unchanged. IMPRESSION: 1. Interval worsening in moderate diffuse pulmonary interstitial edema with worsened right greater than left pleural effusions. 2. Increased superimposed bibasilar opacities favored to reflect atelectasis and/or edema. 3. Stable cardiomegaly with sequelae of prior CABG. 4. Right PICC catheter in place with tip  overlying the distal SVC. Electronically Signed   By: Jeannine Boga M.D.   On: 12/31/2017 21:37   Critical care time: Total of 60 minutes, 6:45 AM-7:15 AM and 8 AM-8:30 AM  Marzetta Board, MD,  PhD [facility] Pager [phone]  If 7PM-7AM, please contact night-coverage [email or web address] Password [facility] 01/01/2018, 9:19 AM

## 2018-01-01 NOTE — Progress Notes (Signed)
RN went into assess and turn pt. RN noticed pt had small amount of vomit on her right shoulder that looked like pt's tube feeding. Pt blood pressure was 173/106 and HR was sustaining in the 120s. Pt work of breathing had also increased. RN immediately turned off tube feeding. RN notified NP Blount who was present on the floor. RN also notified Rapid Response Nurse on pt change.

## 2018-01-01 NOTE — Progress Notes (Signed)
[name], female   DOB: 11-06-1927, 84 y.o.   MRN: [id number]  This NP visited patient at the bedside as a follow up for palliative medicine needs and emotional support at request of attending, patient with respiratory failure today requiring intubation.  Patient was seen previously by this nurse practitioner on 12/13/2017 when I met at the bedside with her son Mr Shaver for a GOCs discusssion; he tells me he is a Psychiatric nurse from [employer].    Patient was lethargic and unable to participate in any conversation today.  She was admitted on 12/05/2017 aphasia.. CT was negative for CVA.  Hospital course was complicated by failure to thrive.  Currently she has a PEG tube for nutritional support.  At that time the son reported that his mother was functional, walking with a walker and able to perform all of her ADLs independently 1 month ago.  After a detailed discussion regarding treatment option decisions and advanced care planning decisions, Dr. Mayer Masker made it clear that he was open to all offered and available medical interventions to prolong life.     Today this NP meet at the bedside with daughter/[name]/she lives in [place] and tells me she has been here since Thanksgiving.  I introduced myself and the role of palliative medicine into a holistic treatment plan.  We briefly discussed her brother being the healthcare power of attorney and his direction for the patient being, open to all offered and available medical interventions to prolong life.  Discussed  the concept of comfort and dignity as they relate to  concepts of life prolonging measures when the body begins to fail to thrive.  Discussed with daughter at bedside the importance of continued conversation with family and the medical providers regarding overall plan of care and treatment options,  ensuring decisions are within the context of the patients values and GOCs.  Left my card and encourage family to call with  questions or concerns  Questions and concerns addressed   Discussed with Dr Lynetta Mare and bedside RN  Total time spent on the unit was 25 minutes  Greater than 50% of the time was spent in counseling and coordination of care  Wadie Lessen NP  Palliative Medicine Team Team Phone # [phone] Pager [phone]

## 2018-01-01 NOTE — Progress Notes (Signed)
Pt respirations are in the 30s and HR still sustaining in the 140s. Administered second dosage of metoprolol intravenous at 2017. RN also called rapid response nurse to come to the bedside and assess pt further.

## 2018-01-01 NOTE — Progress Notes (Signed)
Notified NP Blount at [facility] that pt HR was sustaining in the 140s to 150s and pt blood pressure at 1857 was 180/113 and had still not come down. Day shift RN had given PRN hydralazine.   NP Blount ordered metoprolol 5 mg injection intravenous. Administered at [place], will continue to monitor pt and await for additional orders.

## 2018-01-01 NOTE — Progress Notes (Signed)
SLP Cancellation Note  Patient Details Name: [name] MRN: [id number] DOB: [birth year]   Cancelled treatment:       Reason Eval/Treat Not Completed: Medical issues which prohibited therapy - transferred to ICU, intubated.    [name] 01/01/2018, 1:21 PM  [name], M.A. [place] Acute Environmental education officer [phone] Office [phone]

## 2018-01-01 NOTE — Procedures (Signed)
Intubation Procedure Note Goldie Tregoning [id number] October 25, 1927  Procedure: Intubation Indications: Respiratory insufficiency  Procedure Details Consent: Risks of procedure as well as the alternatives and risks of each were explained to the (patient/caregiver).  Consent for procedure obtained. Time Out: Verified patient identification, verified procedure, site/side was marked, verified correct patient position, special equipment/implants available, medications/allergies/relevent history reviewed, required imaging and test results available.  Performed  Drugs:  100 mcg Fentanyl, 1 mg Versed, 10 mg Etomidate, 50 mg Rocuronium. VL x 1 with # 3 blade. Grade 1 view. Copious thick dried secretions were noted on posterior tongue, posterior oropharynx, above epiglottis, above vocal cords, and entering into esophagus.  I attempted to suction all out; however, needed to switch to [place]ill forceps to retrieve the remaining pieces. 7.5 tube visualized passing through vocal cords.  Following intubation:  positive color change on ETCO2, condensation seen in endotracheal tube, equal breath sounds bilaterally.  Evaluation Hemodynamic Status: Transient hypotension treated with fluid; O2 sats: stable throughout Patient's Current Condition: stable Complications: No apparent complications Patient did tolerate procedure well. Chest X-ray ordered to verify placement.  CXR: pending.   Montey Hora, [facility] Pager: [phone]  or [phone] 01/01/2018, 12:38 PM

## 2018-01-01 NOTE — Progress Notes (Signed)
OT Cancellation Note  Patient Details Name: [name] MRN: [id number] DOB: 01-11-1928   Cancelled Treatment:    Reason Eval/Treat Not Completed: Medical issues which prohibited therapy. Pt being transferred to ICU.  Gypsy Decant, MS, OTR/L 01/01/2018, 12:57 PM

## 2018-01-01 NOTE — Progress Notes (Signed)
Progress Note  Patient Name: [name] Date of Encounter: 01/01/2018  Primary Cardiologist: Minus Breeding, MD   Subjective   Increased work of breathing. Not responding to verbal commands.  Inpatient Medications    Scheduled Meds: . sodium chloride   Intravenous Once  . alteplase  2 mg Intracatheter Once  . atorvastatin  40 mg Per Tube q1800  . chlorhexidine gluconate (MEDLINE KIT)  15 mL Mouth Rinse BID  . cholecalciferol  400 Units Per Tube Daily  . clopidogrel  75 mg Per Tube Daily  . ferrous sulfate  300 mg Per Tube Daily  . furosemide  40 mg Intravenous BID  . insulin aspart  0-15 Units Subcutaneous Q4H  . insulin detemir  10 Units Subcutaneous BID  . levalbuterol  0.63 mg Nebulization BID   And  . ipratropium  0.5 mg Nebulization BID  . mouth rinse  15 mL Mouth Rinse 10 times per day  . metoCLOPramide (REGLAN) injection  5 mg Intravenous Q6H  . multivitamin  15 mL Per Tube Daily  . nitroGLYCERIN  1 inch Topical Q6H  . pantoprazole (PROTONIX) IV  40 mg Intravenous QHS  . sennosides  5 mL Per Tube QHS   Continuous Infusions: . feeding supplement (JEVITY 1.2 CAL) Stopped (01/01/18 0315)  . fentaNYL infusion INTRAVENOUS 50 mcg/hr (01/01/18 1321)  . piperacillin-tazobactam (ZOSYN)  IV 2.25 g (01/01/18 1007)   PRN Meds: acetaminophen, fentaNYL, fentaNYL, hydrALAZINE, levalbuterol, loperamide HCl, midazolam, midazolam, nitroGLYCERIN, ondansetron (ZOFRAN) IV, sodium chloride flush   Vital Signs    Vitals:   01/01/18 1240 01/01/18 1300 01/01/18 1400 01/01/18 1500  BP: (!) 122/58 (!) 137/100  (!) 91/44  Pulse: 85 88 79 71  Resp: 16 16 16 16   Temp:      TempSrc:      SpO2: 100% 96% 100% 100%  Weight:      Height:        Intake/Output Summary (Last 24 hours) at 01/01/2018 1530 Last data filed at 12/31/2017 2000 Gross per 24 hour  Intake -  Output 500 ml  Net -500 ml   Filed Weights   12/28/17 0452 12/29/17 0300 12/31/17 0600  Weight: 58.1 kg 58 kg 59 kg     Telemetry    nsr - Personally Reviewed  ECG    Sinus tachycardia Left axis deviation ST & T wave abnormality, consider lateral ischemia - Personally Reviewed  Physical Exam   GEN: Not responsive to verbal commands, increased work of breathing.  Neck: no appreciable JVD though exam challenging due to patient positioning. Cardiac: tachycardic, no murmurs appreciated Respiratory: coarse bilateral breath sounds, tachypneic GI: Soft, nontender, non-distended PEG in place MS: Bilateral edema; No deformity. Neuro:  Nonfocal  Psych: Normal affect   Labs    Chemistry Recent Labs  Lab 12/27/17 0400  12/30/17 0627 12/31/17 0645 01/01/18 0434  NA 137   < > 143 144 142  K 5.7*   < > 3.9 3.8 4.0  CL 100   < > 100 102 100  CO2 29   < > 32 31 29  GLUCOSE 180*   < > 98 117* 173*  BUN 70*   < > 56* 51* 51*  CREATININE 2.64*   < > 2.21* 2.10* 2.10*  CALCIUM 8.8*   < > 8.1* 7.9* 8.4*  PROT  --   --   --   --  6.3*  ALBUMIN 1.7*  --   --   --  1.7*  AST  --   --   --   --  18  ALT  --   --   --   --  17  ALKPHOS  --   --   --   --  47  BILITOT  --   --   --   --  0.9  GFRNONAA 16*   < > 19* 20* 20*  GFRAA 18*   < > 22* 24* 24*  ANIONGAP 8   < > 11 11 13    < > = values in this interval not displayed.     Hematology Recent Labs  Lab 12/30/17 0627 12/31/17 0645 01/01/18 0434  WBC 9.7 10.2 16.1*  RBC 3.04* 2.89* 3.42*  HGB 8.6* 8.2* 9.6*  HCT 29.0* 27.9* 32.7*  MCV 95.4 96.5 95.6  MCH 28.3 28.4 28.1  MCHC 29.7* 29.4* 29.4*  RDW 16.8* 17.1* 17.0*  PLT 251 278 355    Cardiac Enzymes Recent Labs  Lab 01/01/18 0434  TROPONINI 1.03*   No results for input(s): TROPIPOC in the last 168 hours.   BNP Recent Labs  Lab 01/01/18 0434  BNP >4,500.0*     DDimer No results for input(s): DDIMER in the last 168 hours.   Radiology    Dg Chest Port 1 View  Result Date: 01/01/2018 CLINICAL DATA:  Respiratory failure. Follow-up exam. Intubated patient. EXAM: PORTABLE  CHEST 1 VIEW COMPARISON:  12/31/2017 and older exams. FINDINGS: New endotracheal tube has its tip projecting 13 mm above the [place]. New nasal/orogastric tube passes below the diaphragm well into the stomach. Interstitial thickening has improved compared to the previous day's study. There is persistent hazy opacity at both lung bases obscuring the hemidiaphragms consistent with a small pleural effusions. No new lung abnormalities. No pneumothorax. Right PICC is stable. IMPRESSION: 1. Endotracheal tube and nasal/orogastric tube are well positioned. 2. Improved lung aeration with decreased bilateral interstitial edema. Electronically Signed   By: Lajean Manes M.D.   On: 01/01/2018 12:59   Dg Chest Port 1 View  Result Date: 12/31/2017 CLINICAL DATA:  Initial evaluation for acute respiratory distress. EXAM: PORTABLE CHEST 1 VIEW COMPARISON:  Previous radiograph from 12/25/2017 FINDINGS: Median sternotomy wires underlying CABG markers and surgical clips, stable. Right PICC catheter in place with tip overlying distal SVC. Moderate cardiomegaly unchanged. Mediastinal silhouette within normal limits. Aortic atherosclerosis. Lungs mildly hypoinflated. Moderate diffuse pulmonary interstitial edema has slightly worsened from previous. Associated bibasilar layering pleural effusions, right greater than left, also worsened. Increased bibasilar opacities favored to reflect atelectasis and/or edema. No pneumothorax. Osseous structures unchanged. IMPRESSION: 1. Interval worsening in moderate diffuse pulmonary interstitial edema with worsened right greater than left pleural effusions. 2. Increased superimposed bibasilar opacities favored to reflect atelectasis and/or edema. 3. Stable cardiomegaly with sequelae of prior CABG. 4. Right PICC catheter in place with tip overlying the distal SVC. Electronically Signed   By: Jeannine Boga M.D.   On: 12/31/2017 21:37    Cardiac Studies   No new  Patient Profile     84  y.o. female with PMH of CAD s/p CABG 2011, CKD stage III (followed by Dr. Florene Glen), HTN, HLD, DM II, CVA in 08/2016, chronic combined systolic and diastolic heart failure who was admitted for AMS on 12/05/2017, neurology felt this is mixed underlying dementing process consisting with vascular dementia with Alzheimer's. She had worsening pulmonary edema on 12/21/2017. Trop drawn after the event 8.87 --> 12.17. Cardiology was consulted for elevated trop.   Assessment &  Plan  Principal Problem:   Dysarthria Active Problems:   Diabetes mellitus ([place])   Hyperlipidemia   Essential hypertension   Coronary atherosclerosis   Stroke (cerebrum) ([place])   Stage 3 chronic kidney disease ([place])   Acute lower UTI   Adult failure to thrive   UTI (urinary tract infection)   DNR (do not resuscitate) discussion   Palliative care by specialist   CHF (congestive heart failure) ([place])   Aspiration pneumonia due to gastric secretions ([place])   NSTEMI (non-ST elevated myocardial infarction) ([place])   Altered mental status   Acute respiratory failure with hypoxia ([place])  1. Elevated troponin  -medical management, continue on ASA/plavix, atorvastatin. - on metoprolol tartrate 75 mg bid, continue as tolerated.   2. Acute combined systolic and diastolic HF - continue diuresis today, lasix 40 mg IV BID.  3. HTN  - previously on nitro gtt however this was transitioned to paste. Nitro likely less effective with longer use, with blood pressure rising while in use. Hydralazine is on board as PRN. Metoprolol as noted above.   Remainder per IM. Palliative care on board for symptom management and goals of care. Challenging situation with labile blood pressure, mental status, and respiratory failure.   For questions or updates, please contact [place] Please consult [email or web address] for contact info under      Signed, Elouise Munroe, MD

## 2018-01-01 NOTE — Progress Notes (Signed)
Nutrition Follow-up  DOCUMENTATION CODES:   Non-severe (moderate) malnutrition in context of chronic illness, Underweight  INTERVENTION:   Pending decisions regarding GOC, recommend restarting tube feeding via G-tube: - Start Jevity 1.2 @ 25 ml/hr and increase by 10 ml q 8 hours until goal rate of 55 ml/hr is reached - Continue free water flushes 135 ml TID - Continue liquid MVI daily via G-tube  Recommended TF regimen at goal reate provides 1584 kcals, 73 grams protein, and 1470 ml free water, which meets 100% of estimated kcal and protein needs.   NUTRITION DIAGNOSIS:   Moderate Malnutrition related to chronic illness (dysphagia 2/2 to achalasia) as evidenced by energy intake < or equal to 75% for > or equal to 1 month, mild fat depletion, moderate fat depletion, mild muscle depletion, moderate muscle depletion, energy intake < or equal to 50% for > or equal to 1 month.  Ongoing  GOAL:   Patient will meet greater than or equal to 90% of their needs  Unmet, TF off at this time  MONITOR:   PO intake, Supplement acceptance, Labs, Weight trends, Skin, I & O's  REASON FOR ASSESSMENT:   Consult Assessment of nutrition requirement/status, Poor PO  ASSESSMENT:   [name] is a 84 y.o. female with medical history significant for DM2, HTN, HLD, CAD s/p CABG, h/o CVA, CKD3, who was recently diagnosed with achalasia after a 50 lb weight loss over the past year. She was treated with botox earlier in October and sent to SNF with the hopes she would be able to swallow her food. She has not done well since that time, has only been able to drink small amounts of milk. Son is a Management consultant in [place] and has been visiting. Yesterday he noticed she was stuttering, having difficulty getting words out, and progressed until she seemed to have total aphasia last night. She does report having dysuria and some lower abd pain. No N/V. Has a good appetite but avoids food because of the  obstruction. Megace and remeron have not helped. Process of being worked up for G tube has been initiated and pt reportedly has appt with [facility] tomorrow morning. Son feels like her weight loss and FTT could be reversible if she gets a G tube as she was reportedly very functional a month ago, walking with a walker and performing all of her ADLs.   Pt admitted with dysarthria and achalasia.  11/7 - per IR notes, imaging revealed moderate sized hiatal hernia and colon in upper abdomen, recommending evaluating anatomy with fluoroscopy 11/8 - PICC placed 11/10 - TPN started, IR placed G-tube 11/11 - G-tube cleared for use by RI, TF initiated 11/13 - TPN d/c 11/20 - transitioned to bolus feedings 11/23 - s/p BSE, recommend continued NPO due to AMS, emesis with bolus feedings, rapid response called 11/25 - rapid response, tube feeds held and restarted in the afternoon at 20 ml/hr 11/27 - per MD, tube feeds at 35 ml/hr during the daytime and 30 ml/hr overnight 12/1 - rapid response for increased WOB 12/2 - emesis, TF turned off  No tube feeding infusing at this time due to pt vomiting this morning. Per discussion with RN, no plans to restart tube feeding at this time pending [place] discussion. Goal tube feeding is Jevity 1.2 @ 55 ml/hr with free water 135 ml q 8 hours via G-tube.  Per MD note, nephrology consulted today. Given worsening status and likely continued aspiration, MD leave tube feeds off.  Spoke with family member at bedside. Family member with questions regarding how to prevent pt from vomiting TF in the future.  Weight up 27 lbs since admission. Likely related to positive fluid balance. However, UOP not recorded in I/O's.  Medications reviewed and include: cholecalciferol, ferrous sulfate, SSI, Levemir 10 units BID, IV Reglan 5 mg q 6 hours, Remeron 7.5 mg daily, liquid MVI, Protonix, Senokot, IV antibiotics, NS @ 10 ml/hr  Labs reviewed: BUN 51 (H), creatinine 2.10 (H), hemoglobin  9.6 (L) CBG's: 119, 180, 178, 103, 147 x 24 hours  Diet Order:   Diet Order            Diet NPO time specified  Diet effective now              EDUCATION NEEDS:   Education needs have been addressed  Skin:  Skin Assessment: Skin Integrity Issues: Stage II: left buttocks, sacrum  Last BM:  12/2 (small)  Height:   Ht Readings from Last 1 Encounters:  12/20/17 5\' 3"  (1.6 m)    Weight:   Wt Readings from Last 1 Encounters:  12/31/17 59 kg    Ideal Body Weight:  52.3 kg  BMI:  Body mass index is 23.03 kg/m.  Estimated Nutritional Needs:   Kcal:  1450-1650  Protein:  60-75 grams  Fluid:  > 1.4 L    [name], MS, RD, LDN Inpatient Clinical Dietitian Pager: [phone] Weekend/After Hours: [phone]

## 2018-01-01 NOTE — Progress Notes (Signed)
Unable to obtain ABG at this time. Patient has been stuck 4 times & all attempts were unsuccessful. CCM PA was made aware that 2 RT's were unable to obtain ABG.

## 2018-01-01 NOTE — Consult Note (Signed)
NAME:  [name], MRN:  [id number], DOB:  1927-10-08, LOS: 23 ADMISSION DATE:  12/05/2017, CONSULTATION DATE:  01/01/18 REFERRING MD:  [name]  CHIEF COMPLAINT:  Respiratory Distress   Brief History   [name] is a 84 y.o. female who was initially admitted 11/5 with aphasia.  Had imaging that was negative for CVA.  Has had complicated course with FTT, AoC combined heart failure, AoCKD, acute pulmonary edema, respiratory distress.  On 12/2, had recurrent episode of respiratory distress requiring intubation.  History of present illness   Pt is encephelopathic; therefore, this HPI is obtained from chart review. [name] is a 84 y.o. female who has a PMH as outlined below (see "past medical history").  She presented to [facility] 11/5 with aphasia.  On 11/5, son had noticed that she was slurring words; therefore, he brought her to the hospital for further evaluation.  She had CT and MRI which was negative for CVA.  She was found to have UTI and was started on Rocephin which was later changed to ampicillin and later zosyn.  Hospital course was complicated by failure to thrive.  Palliative care consulted 11/13 for goals of care.  Family opted for full code / full scope of care as feared mother would not receive appropriate treatment otherwise (Son is apparently a Copy).  Son requested neurology consultation on 11/20 for FTT as well as decreasing memory.  Neuro felt that her presentation was suggestive of a possible mixed underlying dementia process with cognitive worsening precipitated by intercurrent illness.  On 11/21, she had an episode of acute respiratory distress.  Was found to have acute pulmonary edema as well as elevated troponin felt to be due to NSTEMI.  She was treated with heparin, statin, beta-blocker, Plavix and Lasix.  An echocardiogram was obtained and demonstrated EF 30% with G2DD.  On 12/2, she had recurrent episode to the point that she was almost agonal.  PCCM was  called in consultation.  I had lengthy discussion with pt's daughter and son (Copy).  Son frustrated that pt not on nitro gtt, feels that pt has deteriorated "because of what we have done to her".  Of note, she apparently was recently diagnosed with achalasia after 50 lb weight loss over the past 1 year.  She had been evaluated by GI and had an appointment as an outpatient on 11/6.  Due to this, she has had trouble eating and maintaining weight; hence, the 50lb weight loss and FTT.  Past Medical History  HTN, combined heart failure, CAD, cardiomyopathy, CKD, DM II, FTT.  Significant Hospital Events   11/5 > admit. 11/10 > G tube placed by IR. 11/13 > PMT consulted. 11/20 > neuro consult.  Rapid response for respiratory distress, treated with lasix. 11/21 > cardiology consult for NSTEMI. 11/23 > rapid response for respiratory distress, treated with lasix and morphine. 11/27 > ENT consulted for stridor, flexible laryngoscopy negative. 12/1 > rapid response for respiratory distress, treated with morphine. 12/2 > PCCM consulted for agonal respirations, transferred to ICU for intubation.  Consults:  Radiology. Palliative Care. Surgery. Neurology. Cardiology. ENT. PCCM. Nephrology.  Procedures:  G tube 11/10 > ETT 12/2 >   Significant Diagnostic Tests:  CT head 11/5 > chronic ischemic disease. MRI brain 11/5 > no acute process. Abd [place] 11/19 > mod size periumbilical hernia with solid density within.   Echo 11/21 > EF 30%, G2DD. Flex laryngoscopy 11/27 > negative.  Micro Data:  Urine 11/5 > enterococcus  faecalis. Blood 11/22 > negative. Urine 11/23 > enterococcus faecalis. Blood 12/2 > Sputum 12/2 >   Antimicrobials:  Ceftriaxone 11/5 > 11/6. Ampicillin 11/6 > 11/8. Zosyn 11/24 > 11/29.  12/2 >   Interim history/subjective:  Unresponsive.  Just intubated.  Objective:  Blood pressure (!) 163/99, pulse (!) 121, temperature 99.1 F (37.3 C), temperature source  Rectal, resp. rate (!) 25, height 5\' 3"  (1.6 m), weight 59 kg, SpO2 96 %.    FiO2 (%):  [30 %] 30 %   Intake/Output Summary (Last 24 hours) at 01/01/2018 1139 Last data filed at 12/31/2017 2000 Gross per 24 hour  Intake -  Output 500 ml  Net -500 ml   Filed Weights   12/28/17 0452 12/29/17 0300 12/31/17 0600  Weight: 58.1 kg 58 kg 59 kg    Examination: General: Adult female, chronically ill appearing, in NAD following intubation. Neuro: Unresponsive. HEENT: [place]/AT. Sclerae anicteric.  Cardiovascular: RRR, no M/R/G.  Lungs: Respirations unlabored on full vent support.  CTAB. Abdomen: PEG in place.  BS x 4, soft, NT/ND.  Musculoskeletal: No gross deformities, no edema.  Bilateral ankles in pressure relief boots. Skin: Sacral pressure sores per RN.  Otherwise skin is warm, no rashes.  Assessment & Plan:   Acute hypoxic respiratory failure - presumed due to acute pulmonary edema in the setting of AoC / decompensated combined heart failure. - Transferred to ICU for STAT intubation. - Bronchial hygiene. - Follow CXR. - Needs continued goals of care discussions with family (primarily the son who is a Copy).  Probable aspiration - copious thick dried secretions noted in oropharynx and around vocal cords during intubation. - Continue zosyn. - Assess tracheal aspirate.  AoC / decompensated combined heart failure - echo from 11/21 with EF 30% and G2DD. - Cardiology following. - Continue diuresis, goal neg balance (currently -345). - Continue plavix. - D/c hydralazine. - Hold preadmission amlodipine, imdur, lopressor.  NSTEMI - s/p heparin gtt. - Continue nitro paste. - Cardiology following.  AoCKD - had worsening after diuresis following previous episode of respiratory distress. - Nephrology consulted by [facility]. - D/c free water. - Continue lasix for now.  Pseudohypocalcemia - corrects to 10.2. - Assess ionized calcium.  DM II. - Continue SSI, levemir. - Hold  preadmission glipizide.  Dysphagia. - Continue TF's.  AoC anemia. - Transfuse for Hgb < 7.  Acute encephalopathy - unclear etiology. FTT - per imaging and neuro, picture suggestive of underlying vascular dementia.  Palliative care had been consulted and family opted for continued full scope of treatment. - Limit sedating meds. - D/c mirtazapine, morphine, quetiapine. - Continue goals of care discussions.  Best Practice:  Diet: TF's. Pain/Anxiety/Delirium protocol (if indicated): Fentanyl gtt / Midazolam PRN.  RASS goal 0. VAP protocol (if indicated): In place. DVT prophylaxis: SCD's. GI prophylaxis: Pantoprazole. Glucose control: SSI. Mobility: Bedrest. Code Status: Full. Family Communication: Updated pt's daughter at bedside and son over the phone.  Son is a Copy and questions why pt is not on a nitroglycerin infusion.  He also states that "[facility] are the ones who got her in this state from the 27L positive fluid balance". Disposition: ICU.  Labs   CBC: Recent Labs  Lab 12/28/17 0502 12/29/17 0406 12/30/17 0627 12/31/17 0645 01/01/18 0434  WBC 12.8* 9.3 9.7 10.2 16.1*  HGB 9.1* 8.6* 8.6* 8.2* 9.6*  HCT 29.8* 28.7* 29.0* 27.9* 32.7*  MCV 93.7 93.2 95.4 96.5 95.6  PLT 276 276 251 278 355  Basic Metabolic Panel: Recent Labs  Lab 12/25/17 1331  12/28/17 0502 12/29/17 0406 12/30/17 0627 12/31/17 0645 01/01/18 0434  NA 136   < > 139 139 143 144 142  K 5.0   < > 5.1 4.1 3.9 3.8 4.0  CL 99   < > 99 101 100 102 100  CO2 29   < > 30 32 32 31 29  GLUCOSE 216*   < > 185* 127* 98 117* 173*  BUN 56*   < > 68* 65* 56* 51* 51*  CREATININE 2.28*   < > 2.67* 2.41* 2.21* 2.10* 2.10*  CALCIUM 9.5   < > 8.8* 8.1* 8.1* 7.9* 8.4*  MG 2.0  --   --   --   --   --   --    < > = values in this interval not displayed.   GFR: Estimated Creatinine Clearance: 15.3 mL/min (A) (by C-G formula based on SCr of 2.1 mg/dL (H)). Recent Labs  Lab 12/29/17 0406 12/30/17 0627  12/31/17 0645 12/31/17 2214 01/01/18 0434 01/01/18 0802  WBC 9.3 9.7 10.2  --  16.1*  --   LATICACIDVEN  --   --   --  1.9 1.3 1.0   Liver Function Tests: Recent Labs  Lab 12/27/17 0400 01/01/18 0434  AST  --  18  ALT  --  17  ALKPHOS  --  47  BILITOT  --  0.9  PROT  --  6.3*  ALBUMIN 1.7* 1.7*   No results for input(s): LIPASE, AMYLASE in the last 168 hours. No results for input(s): AMMONIA in the last 168 hours. ABG    Component Value Date/Time   PHART 7.457 (H) 12/31/2017 2120   PCO2ART 40.5 12/31/2017 2120   PO2ART 56.2 (L) 12/31/2017 2120   HCO3 28.2 (H) 12/31/2017 2120   TCO2 22 12/05/2017 1201   ACIDBASEDEF 2.8 (H) 12/20/2017 2235   O2SAT 89.8 12/31/2017 2120    Coagulation Profile: No results for input(s): INR, PROTIME in the last 168 hours. Cardiac Enzymes: Recent Labs  Lab 01/01/18 0434  TROPONINI 1.03*   HbA1C: Hgb A1c MFr Bld  Date/Time Value Ref Range Status  12/24/2017 01:13 PM 5.9 (H) 4.8 - 5.6 % Final    Comment:    (NOTE) Pre diabetes:          5.7%-6.4% Diabetes:              >6.4% Glycemic control for   <7.0% adults with diabetes   11/04/2017 11:10 AM 9.0 (H) 4.8 - 5.6 % Final    Comment:    (NOTE) Pre diabetes:          5.7%-6.4% Diabetes:              >6.4% Glycemic control for   <7.0% adults with diabetes    CBG: Recent Labs  Lab 12/31/17 1945 01/01/18 0052 01/01/18 0713 01/01/18 1016 01/01/18 1127  GLUCAP 178* 180* 119* 114* 144*    Review of Systems:   Unable to obtain as pt is encephalopathic.  Past medical history  She,  has a past medical history of Acute renal injury ([place]) (07/2016), Anginal pain ([place]) (2004; 2011), CAD (coronary artery disease), Cardiomyopathy, Chronic kidney disease (CKD), stage III (moderate) ([place]), History of blood transfusion (1960s), HTN (hypertension), Pleural effusion (2011), Shortness of breath, Stroke ([place]) (09/19/2016), and Type II diabetes mellitus ([place]).   Surgical History    Past  Surgical History:  Procedure Laterality Date  . BOTOX  INJECTION  11/20/2017   Procedure: BOTOX INJECTION;  Surgeon: Doran Stabler, MD;  Location: [facility];  Service: Gastroenterology;;  . CATARACT EXTRACTION W/ INTRAOCULAR LENS  IMPLANT, BILATERAL Bilateral   . CORONARY ANGIOPLASTY WITH STENT PLACEMENT  2004   "LAD & one of the circumflex"  . CORONARY ARTERY BYPASS GRAFT  2011   CABG X4  . ESOPHAGOGASTRODUODENOSCOPY (EGD) WITH PROPOFOL N/A 11/20/2017   Procedure: ESOPHAGOGASTRODUODENOSCOPY (EGD) WITH PROPOFOL;  Surgeon: Doran Stabler, MD;  Location: [facility];  Service: Gastroenterology;  Laterality: N/A;  . IR GASTROSTOMY TUBE MOD SED  12/10/2017     Social History   reports that she has never smoked. She has never used smokeless tobacco. She reports that she does not drink alcohol or use drugs.   Family history   Her family history includes Diabetes (age of onset: 22) in her father; Unexplained death (age of onset: 79) in her mother. There is no history of CAD.   Allergies Allergies  Allergen Reactions  . Contrast Media [Iodinated Diagnostic Agents]     Sensitivity, kidney issues     Home meds  Prior to Admission medications   Medication Sig Start Date End Date Taking? Authorizing Provider  acetaminophen (TYLENOL) 500 MG tablet Take 500 mg by mouth every 6 (six) hours as needed for headache.    Yes [provider]  amLODipine (NORVASC) 10 MG tablet TAKE ONE TABLET BY MOUTH ONCE DAILY. Patient taking differently: Take 10 mg by mouth daily.  07/27/17  Yes Minus Breeding, MD  atorvastatin (LIPITOR) 40 MG tablet Take 1 tablet (40 mg total) by mouth daily. Patient taking differently: Take 40 mg by mouth daily at 6 PM.  04/25/17  Yes Garvin Fila, MD  cholecalciferol (VITAMIN D) 400 units TABS tablet Take 1 tablet (400 Units total) by mouth daily. 10/06/16  Yes Angiulli, Lavon Paganini, PA-C  ferrous sulfate 220 (44 Fe) MG/5ML solution Take 10 mLs by mouth daily.  10/25/17  Yes [provider]  glipiZIDE (GLUCOTROL) 5 MG tablet Take 2 tablets (10 mg total) by mouth daily before breakfast. 11/24/17  Yes Nita Sells, MD  isosorbide mononitrate (IMDUR) 30 MG 24 hr tablet TAKE 1 TABLET BY MOUTH ONCE DAILY Patient taking differently: Take 30 mg by mouth daily.  09/22/17  Yes Minus Breeding, MD  megestrol (MEGACE) 20 MG tablet Take 20 mg by mouth at bedtime.   Yes [provider]  metoprolol tartrate 75 MG TABS Take 75 mg by mouth 2 (two) times daily. 11/24/17  Yes Nita Sells, MD  mirtazapine (REMERON) 15 MG tablet Take 7.5 mg by mouth at bedtime.   Yes [provider]  nitroGLYCERIN (NITROSTAT) 0.4 MG SL tablet Place 1 tablet (0.4 mg total) under the tongue every 5 (five) minutes x 3 doses as needed for chest pain. 10/08/16  Yes Rosita Fire, Brittainy M, PA-C  pantoprazole (PROTONIX) 40 MG tablet Take 1 tablet (40 mg total) by mouth 2 (two) times daily. 11/24/17  Yes Nita Sells, MD  polyethylene glycol (MIRALAX / GLYCOLAX) packet Take 17 g by mouth daily. 10/16/16  Yes Regalado, Belkys A, MD  senna (SENOKOT) 8.6 MG TABS tablet Take 1 tablet (8.6 mg total) by mouth daily. Patient taking differently: Take 1 tablet by mouth at bedtime.  10/16/16  Yes Regalado, Belkys A, MD  feeding supplement, ENSURE ENLIVE, (ENSURE ENLIVE) LIQD Take 237 mLs by mouth 2 (two) times daily between meals. Patient not taking: Reported on 12/05/2017 11/24/17  Nita Sells, MD  megestrol (MEGACE) 400 MG/10ML suspension Take 10 mLs (400 mg total) by mouth daily. Patient not taking: Reported on 12/05/2017 11/24/17   Nita Sells, MD  protein supplement shake (PREMIER PROTEIN) LIQD Take 325 mLs (11 oz total) by mouth daily. Patient not taking: Reported on 11/04/2017 10/15/16   Niel Hummer A, MD    Critical care time: 60 min.    Montey Hora, [facility] Pager: [phone]  or  [phone] 01/01/2018, 11:39 AM

## 2018-01-01 NOTE — Progress Notes (Signed)
Pharmacy Antibiotic Note  [name] is a 84 y.o. female admitted on 12/05/2017 with AMS; overnight pt experienced increased WOB >> concern for aspiration pneumonia.  Pharmacy has been consulted for Zosyn dosing.  Plan: Zosyn 2.25g IV Q8H.  Height: 5\' 3"  (160 cm) Weight: 130 lb (59 kg) IBW/kg (Calculated) : 52.4  Temp (24hrs), Avg:99.3 F (37.4 C), Min:97.6 F (36.4 C), Max:101.1 F (38.4 C)  Recent Labs  Lab 12/28/17 0502 12/29/17 0406 12/30/17 0627 12/31/17 0645 12/31/17 2214 01/01/18 0434  WBC 12.8* 9.3 9.7 10.2  --  16.1*  CREATININE 2.67* 2.41* 2.21* 2.10*  --  2.10*  LATICACIDVEN  --   --   --   --  1.9 1.3    Estimated Creatinine Clearance: 15.3 mL/min (A) (by C-G formula based on SCr of 2.1 mg/dL (H)).    Allergies  Allergen Reactions  . Contrast Media [Iodinated Diagnostic Agents]     Sensitivity, kidney issues     Thank you for allowing pharmacy to be a part of this patient's care.  Wynona Neat, PharmD, BCPS  01/01/2018 6:46 AM

## 2018-01-01 NOTE — Consult Note (Addendum)
[facility]  HISTORY AND PHYSICAL  [name] is an 84 y.o. female.    Chief Complaint: AMS  HPI: Pt is 68F with PMH significant for HTN, HLD, s/p CVA, CAD s/p CABG, and CKD Stage 3 (baseline Cr 1.1-1.5) who is now seen in consultation at the request of Dr. Renne Crigler for evaluation of AKI on CKD 3 and management of fluid status.    Pt is unable to provide her own history so it is obtained from chart review and discussion with dtr.  Pt was in a declining state of health over the past year with a 50 lb weight loss with inability to eat.  Was diagnosed with achalasia.  Underwent Botox for treatment and was discharged to SNF in hopes that she would be able to consume more calories; however could only drink small amounts of milk.  She had dysarthria on presentation and MRI 11/5 was negative for stroke.  She underwent G-tube placement but unfortunately hasn't been able to tolerate much PO intake and is TF dependent.  She has had complications throughout hospitalization including acute CHF exacerbation, NSTEMI, aspiration pneumonia.  Albumins have remained in the < 2 range.  She also has a sacral pressure ulcer, present on admission.  We are now consulted for AKI on CKD 3.  Pt's creatinine has been fluctuating throughout the course of her hospitalization and the trend over the past week is thusly:  (11/24)1.77--> 2.2--> 2.5--> 2.6 --> 2.4--> 2.2-->2.1 (today 12/2).  Her urine output has dwindled off over the past 24 hours in the setting of fever, tachycardia, tachypnea, and likely aspiration pneumonia.  She is being started on Zosyn.  CXR last night with pulm edema and effusions.  She got 20 IV Lasix at around 730 this AM.     PMH: Past Medical History:  Diagnosis Date  . Acute renal injury ([place]) 07/2016  . Anginal pain ([place]) 2004; 2011  . CAD (coronary artery disease)    status post stenting of the RCA status post CABG. (left internal mammary artery to  LAD, saphenous vein graft to  second diagonal, saphenous vein  graft to obtuse marginal 1, saphenous vein graft to posterior  descending).  . Cardiomyopathy     Mildly reduced EF (45%).   . Chronic kidney disease (CKD), stage III (moderate) ([place])   . History of blood transfusion 1960s   "related to anemia"  . HTN (hypertension)   . Pleural effusion 2011   S/P "bypass"  . Shortness of breath   . Stroke ([place]) 09/19/2016   "left sided weakness" (09/20/2016)  . Type II diabetes mellitus (HCC)    PSH: Past Surgical History:  Procedure Laterality Date  . BOTOX INJECTION  11/20/2017   Procedure: BOTOX INJECTION;  Surgeon: Doran Stabler, MD;  Location: [facility];  Service: Gastroenterology;;  . CATARACT EXTRACTION W/ INTRAOCULAR LENS  IMPLANT, BILATERAL Bilateral   . CORONARY ANGIOPLASTY WITH STENT PLACEMENT  2004   "LAD & one of the circumflex"  . CORONARY ARTERY BYPASS GRAFT  2011   CABG X4  . ESOPHAGOGASTRODUODENOSCOPY (EGD) WITH PROPOFOL N/A 11/20/2017   Procedure: ESOPHAGOGASTRODUODENOSCOPY (EGD) WITH PROPOFOL;  Surgeon: Doran Stabler, MD;  Location: [facility];  Service: Gastroenterology;  Laterality: N/A;  . IR GASTROSTOMY TUBE MOD SED  12/10/2017     Past Medical History:  Diagnosis Date  . Acute renal injury ([place]) 07/2016  . Anginal pain ([place]) 2004; 2011  . CAD (coronary artery disease)  status post stenting of the RCA status post CABG. (left internal mammary artery to  LAD, saphenous vein graft to second diagonal, saphenous vein  graft to obtuse marginal 1, saphenous vein graft to posterior  descending).  . Cardiomyopathy     Mildly reduced EF (45%).   . Chronic kidney disease (CKD), stage III (moderate) ([place])   . History of blood transfusion 1960s   "related to anemia"  . HTN (hypertension)   . Pleural effusion 2011   S/P "bypass"  . Shortness of breath   . Stroke ([place]) 09/19/2016   "left sided weakness" (09/20/2016)  . Type II diabetes mellitus (HCC)     Medications:    Scheduled: . sodium chloride   Intravenous Once  . alteplase  2 mg Intracatheter Once  . atorvastatin  40 mg Per Tube q1800  . cholecalciferol  400 Units Per Tube Daily  . clopidogrel  75 mg Per Tube Daily  . ferrous sulfate  300 mg Per Tube Daily  . furosemide  40 mg Intravenous BID  . insulin aspart  0-15 Units Subcutaneous Q4H  . insulin detemir  10 Units Subcutaneous BID  . levalbuterol  0.63 mg Nebulization BID   And  . ipratropium  0.5 mg Nebulization BID  . metoCLOPramide (REGLAN) injection  5 mg Intravenous Q6H  . multivitamin  15 mL Per Tube Daily  . nitroGLYCERIN  1 inch Topical Q6H  . pantoprazole (PROTONIX) IV  40 mg Intravenous QHS  . sennosides  5 mL Per Tube QHS    Medications Prior to Admission  Medication Sig Dispense Refill  . acetaminophen (TYLENOL) 500 MG tablet Take 500 mg by mouth every 6 (six) hours as needed for headache.     Marland Kitchen amLODipine (NORVASC) 10 MG tablet TAKE ONE TABLET BY MOUTH ONCE DAILY. (Patient taking differently: Take 10 mg by mouth daily. ) 90 tablet 3  . atorvastatin (LIPITOR) 40 MG tablet Take 1 tablet (40 mg total) by mouth daily. (Patient taking differently: Take 40 mg by mouth daily at 6 PM. ) 60 tablet 3  . cholecalciferol (VITAMIN D) 400 units TABS tablet Take 1 tablet (400 Units total) by mouth daily. 30 each 0  . ferrous sulfate 220 (44 Fe) MG/5ML solution Take 10 mLs by mouth daily.  2  . glipiZIDE (GLUCOTROL) 5 MG tablet Take 2 tablets (10 mg total) by mouth daily before breakfast.    . isosorbide mononitrate (IMDUR) 30 MG 24 hr tablet TAKE 1 TABLET BY MOUTH ONCE DAILY (Patient taking differently: Take 30 mg by mouth daily. ) 30 tablet 5  . megestrol (MEGACE) 20 MG tablet Take 20 mg by mouth at bedtime.    . metoprolol tartrate 75 MG TABS Take 75 mg by mouth 2 (two) times daily.    . mirtazapine (REMERON) 15 MG tablet Take 7.5 mg by mouth at bedtime.    . nitroGLYCERIN (NITROSTAT) 0.4 MG SL tablet Place 1 tablet (0.4 mg total) under  the tongue every 5 (five) minutes x 3 doses as needed for chest pain. 25 tablet 2  . pantoprazole (PROTONIX) 40 MG tablet Take 1 tablet (40 mg total) by mouth 2 (two) times daily.    . polyethylene glycol (MIRALAX / GLYCOLAX) packet Take 17 g by mouth daily. 14 each 0  . senna (SENOKOT) 8.6 MG TABS tablet Take 1 tablet (8.6 mg total) by mouth daily. (Patient taking differently: Take 1 tablet by mouth at bedtime. ) 120 each 0  . feeding supplement,  ENSURE ENLIVE, (ENSURE ENLIVE) LIQD Take 237 mLs by mouth 2 (two) times daily between meals. (Patient not taking: Reported on 12/05/2017) 237 mL 12  . megestrol (MEGACE) 400 MG/10ML suspension Take 10 mLs (400 mg total) by mouth daily. (Patient not taking: Reported on 12/05/2017) 240 mL 0  . protein supplement shake (PREMIER PROTEIN) LIQD Take 325 mLs (11 oz total) by mouth daily. (Patient not taking: Reported on 11/04/2017) 30 Can 0    ALLERGIES:   Allergies  Allergen Reactions  . Contrast Media [Iodinated Diagnostic Agents]     Sensitivity, kidney issues    FAM HX: Family History  Problem Relation Age of Onset  . Diabetes Father 37  . Unexplained death Mother 78  . CAD Neg Hx     Social History:   reports that she has never smoked. She has never used smokeless tobacco. She reports that she does not drink alcohol or use drugs.  ROS: ROS: unobtainable due to AMS  Blood pressure (!) 137/100, pulse 88, temperature 99.1 F (37.3 C), temperature source Rectal, resp. rate 16, height 5\' 3"  (1.6 m), weight 59 kg, SpO2 96 %. PHYSICAL EXAM: Physical Exam  GEN frail, elderly woman, on NRB, moaning HEENT doesn't open eyes NECK + JVD PULM diffuse crackles CV tachycardic ABD + G-tube, nontender EXT sarcopenic with 3rd spacing NEURO doesn't respond to commands    Results for orders placed or performed during the hospital encounter of 12/05/17 (from the past 48 hour(s))  Glucose, capillary     Status: Abnormal   Collection Time: 12/30/17  3:54  PM  Result Value Ref Range   Glucose-Capillary 126 (H) 70 - 99 mg/dL  Glucose, capillary     Status: Abnormal   Collection Time: 12/30/17  7:49 PM  Result Value Ref Range   Glucose-Capillary 140 (H) 70 - 99 mg/dL  Glucose, capillary     Status: Abnormal   Collection Time: 12/31/17 12:07 AM  Result Value Ref Range   Glucose-Capillary 130 (H) 70 - 99 mg/dL  Glucose, capillary     Status: Abnormal   Collection Time: 12/31/17  4:12 AM  Result Value Ref Range   Glucose-Capillary 135 (H) 70 - 99 mg/dL  Basic metabolic panel     Status: Abnormal   Collection Time: 12/31/17  6:45 AM  Result Value Ref Range   Sodium 144 135 - 145 mmol/L   Potassium 3.8 3.5 - 5.1 mmol/L   Chloride 102 98 - 111 mmol/L   CO2 31 22 - 32 mmol/L   Glucose, Bld 117 (H) 70 - 99 mg/dL   BUN 51 (H) 8 - 23 mg/dL   Creatinine, Ser 2.10 (H) 0.44 - 1.00 mg/dL   Calcium 7.9 (L) 8.9 - 10.3 mg/dL   GFR calc non Af Amer 20 (L) >60 mL/min   GFR calc Af Amer 24 (L) >60 mL/min   Anion gap 11 5 - 15    Comment: Performed at [facility], [address], [place], [place] [address]  CBC     Status: Abnormal   Collection Time: 12/31/17  6:45 AM  Result Value Ref Range   WBC 10.2 4.0 - 10.5 K/uL   RBC 2.89 (L) 3.87 - 5.11 MIL/uL   Hemoglobin 8.2 (L) 12.0 - 15.0 g/dL   HCT 27.9 (L) 36.0 - 46.0 %   MCV 96.5 80.0 - 100.0 fL   MCH 28.4 26.0 - 34.0 pg   MCHC 29.4 (L) 30.0 - 36.0 g/dL  RDW 17.1 (H) 11.5 - 15.5 %   Platelets 278 150 - 400 K/uL   nRBC 0.0 0.0 - 0.2 %    Comment: Performed at [facility], [place] [address], [place], [place] [address]  Glucose, capillary     Status: Abnormal   Collection Time: 12/31/17  8:34 AM  Result Value Ref Range   Glucose-Capillary 104 (H) 70 - 99 mg/dL  Glucose, capillary     Status: Abnormal   Collection Time: 12/31/17 11:10 AM  Result Value Ref Range   Glucose-Capillary 147 (H) 70 - 99 mg/dL  Glucose, capillary     Status: Abnormal   Collection Time: 12/31/17  4:10 PM   Result Value Ref Range   Glucose-Capillary 103 (H) 70 - 99 mg/dL  Glucose, capillary     Status: Abnormal   Collection Time: 12/31/17  7:45 PM  Result Value Ref Range   Glucose-Capillary 178 (H) 70 - 99 mg/dL  Blood gas, arterial     Status: Abnormal   Collection Time: 12/31/17  9:20 PM  Result Value Ref Range   O2 Content 2.0 L/min   Delivery systems NASAL CANNULA    pH, Arterial 7.457 (H) 7.350 - 7.450   pCO2 arterial 40.5 32.0 - 48.0 mmHg   pO2, Arterial 56.2 (L) 83.0 - 108.0 mmHg   Bicarbonate 28.2 (H) 20.0 - 28.0 mmol/L   Acid-Base Excess 4.4 (H) 0.0 - 2.0 mmol/L   O2 Saturation 89.8 %   Patient temperature 98.6    Collection site RIGHT RADIAL    Sample type ARTERIAL    Allens test (pass/fail) PASS PASS   Mechanical Rate ARTERIAL   Lactic acid, plasma     Status: None   Collection Time: 12/31/17 10:14 PM  Result Value Ref Range   Lactic Acid, Venous 1.9 0.5 - 1.9 mmol/L    Comment: Performed at [facility], [place] [address], [place], [place] [address]  Glucose, capillary     Status: Abnormal   Collection Time: 01/01/18 12:52 AM  Result Value Ref Range   Glucose-Capillary 180 (H) 70 - 99 mg/dL  Brain natriuretic peptide     Status: Abnormal   Collection Time: 01/01/18  4:34 AM  Result Value Ref Range   B Natriuretic Peptide >4,500.0 (H) 0.0 - 100.0 pg/mL    Comment: Performed at [facility] [address], [place], [place] [address]  Troponin I - Once     Status: Abnormal   Collection Time: 01/01/18  4:34 AM  Result Value Ref Range   Troponin I 1.03 (HH) <0.03 ng/mL    Comment: CRITICAL RESULT CALLED TO, READ BACK BY AND VERIFIED WITH: D.DUNCAN,RN 01/01/18 [id number] DAVISB Performed at [facility], [place] [address], [place], [place] [address]   Lactic acid, plasma     Status: None   Collection Time: 01/01/18  4:34 AM  Result Value Ref Range   Lactic Acid, Venous 1.3 0.5 - 1.9 mmol/L    Comment: Performed at [facility] [address]  [place], [place] [address]  CBC     Status: Abnormal   Collection Time: 01/01/18  4:34 AM  Result Value Ref Range   WBC 16.1 (H) 4.0 - 10.5 K/uL   RBC 3.42 (L) 3.87 - 5.11 MIL/uL   Hemoglobin 9.6 (L) 12.0 - 15.0 g/dL   HCT 32.7 (L) 36.0 - 46.0 %   MCV 95.6 80.0 - 100.0 fL   MCH 28.1 26.0 -  34.0 pg   MCHC 29.4 (L) 30.0 - 36.0 g/dL   RDW 17.0 (H) 11.5 - 15.5 %   Platelets 355 150 - 400 K/uL   nRBC 0.0 0.0 - 0.2 %    Comment: Performed at [facility], [place] [address], [place], [place] [address]  Comprehensive metabolic panel     Status: Abnormal   Collection Time: 01/01/18  4:34 AM  Result Value Ref Range   Sodium 142 135 - 145 mmol/L   Potassium 4.0 3.5 - 5.1 mmol/L   Chloride 100 98 - 111 mmol/L   CO2 29 22 - 32 mmol/L   Glucose, Bld 173 (H) 70 - 99 mg/dL   BUN 51 (H) 8 - 23 mg/dL   Creatinine, Ser 2.10 (H) 0.44 - 1.00 mg/dL   Calcium 8.4 (L) 8.9 - 10.3 mg/dL   Total Protein 6.3 (L) 6.5 - 8.1 g/dL   Albumin 1.7 (L) 3.5 - 5.0 g/dL   AST 18 15 - 41 U/L   ALT 17 0 - 44 U/L   Alkaline Phosphatase 47 38 - 126 U/L   Total Bilirubin 0.9 0.3 - 1.2 mg/dL   GFR calc non Af Amer 20 (L) >60 mL/min   GFR calc Af Amer 24 (L) >60 mL/min   Anion gap 13 5 - 15    Comment: Performed at [facility], [place] [address], [place], [place] [address]  Glucose, capillary     Status: Abnormal   Collection Time: 01/01/18  7:13 AM  Result Value Ref Range   Glucose-Capillary 119 (H) 70 - 99 mg/dL  Lactic acid, plasma     Status: None   Collection Time: 01/01/18  8:02 AM  Result Value Ref Range   Lactic Acid, Venous 1.0 0.5 - 1.9 mmol/L    Comment: Performed at [facility] [address], [place], [place] [address]  Glucose, capillary     Status: Abnormal   Collection Time: 01/01/18 10:16 AM  Result Value Ref Range   Glucose-Capillary 114 (H) 70 - 99 mg/dL  Glucose, capillary     Status: Abnormal   Collection Time: 01/01/18 11:27 AM  Result Value Ref Range   Glucose-Capillary 144 (H)  70 - 99 mg/dL    Dg Chest Port 1 View  Result Date: 01/01/2018 CLINICAL DATA:  Respiratory failure. Follow-up exam. Intubated patient. EXAM: PORTABLE CHEST 1 VIEW COMPARISON:  12/31/2017 and older exams. FINDINGS: New endotracheal tube has its tip projecting 13 mm above the [place]. New nasal/orogastric tube passes below the diaphragm well into the stomach. Interstitial thickening has improved compared to the previous day's study. There is persistent hazy opacity at both lung bases obscuring the hemidiaphragms consistent with a small pleural effusions. No new lung abnormalities. No pneumothorax. Right PICC is stable. IMPRESSION: 1. Endotracheal tube and nasal/orogastric tube are well positioned. 2. Improved lung aeration with decreased bilateral interstitial edema. Electronically Signed   By: Lajean Manes M.D.   On: 01/01/2018 12:59   Dg Chest Port 1 View  Result Date: 12/31/2017 CLINICAL DATA:  Initial evaluation for acute respiratory distress. EXAM: PORTABLE CHEST 1 VIEW COMPARISON:  Previous radiograph from 12/25/2017 FINDINGS: Median sternotomy wires underlying CABG markers and surgical clips, stable. Right PICC catheter in place with tip overlying distal SVC. Moderate cardiomegaly unchanged. Mediastinal silhouette within normal limits. Aortic atherosclerosis. Lungs mildly hypoinflated. Moderate diffuse pulmonary interstitial edema has slightly worsened from previous. Associated bibasilar layering pleural effusions, right greater than left, also worsened.  Increased bibasilar opacities favored to reflect atelectasis and/or edema. No pneumothorax. Osseous structures unchanged. IMPRESSION: 1. Interval worsening in moderate diffuse pulmonary interstitial edema with worsened right greater than left pleural effusions. 2. Increased superimposed bibasilar opacities favored to reflect atelectasis and/or edema. 3. Stable cardiomegaly with sequelae of prior CABG. 4. Right PICC catheter in place with tip overlying  the distal SVC. Electronically Signed   By: Jeannine Boga M.D.   On: 12/31/2017 21:37    Assessment/Plan  1.  Acute kidney injury superimposed on CKD 3: I suspect this is a multifactorial combination of possible sepsis, CHF exacerbation, and a clinical manifestation of overall multisystem organ failure.  She is volume overloaded on exam and has already received some IV Lasix this AM.  I am increasing the dose and frequency and following.  Discussed with dtr.  Of note, she is an exceptionally poor dialysis candidate due to debilitated functional status and comorbid conditions.  Please note that her creatinine likely overestimates her GFR due to age and sarcopenia so avoidance of nephrotoxic medications will be of utmost importance.    2.  Acute hypoxic RF: aspiration + vol overload.  Antibiotics per primary.  3.  FTT/Achalasia: s/p G-tube, was on feeds but were stopped overnight with concern for aspiration.    4.  Severe malnutrition: albumin 1.7  5.  Sepsis: cultures taken, abx per primary  6.  NSTEMI: had hep gtt, on nitropaste  7.  Dispo: It appears she's going to ICU.  I fully support continued [place] discussions.   Madelon Lips, MD 01/01/2018, 10:24 AM

## 2018-01-01 NOTE — Progress Notes (Signed)
Pt's daughter called RN to pt's room concerned about pt's HR and blood pressure. Pt was also moaning. Pt's daughter also had pt's son on the phone on speaker and he yelled, "She's probably having another STEMI and can't express her chest pain."   RN obtained EKG on pt which showed sinus tach and notified NP [place].

## 2018-01-01 NOTE — Progress Notes (Signed)
PT Cancellation Note  Patient Details Name: Cornelia Walraven MRN: [id number] DOB: 19-Apr-1927   Cancelled Treatment:    Reason Eval/Treat Not Completed: Medical issues which prohibited therapy. Pt with medical decline and being transferred to ICU.   [place] 01/01/2018, 11:30 AM Arkeem Harts [place] Pager [phone] Office [phone]

## 2018-01-02 ENCOUNTER — Inpatient Hospital Stay (HOSPITAL_COMMUNITY): Payer: Medicare Other

## 2018-01-02 LAB — BASIC METABOLIC PANEL
Anion gap: 10 (ref 5–15)
BUN: 47 mg/dL — ABNORMAL HIGH (ref 8–23)
CO2: 29 mmol/L (ref 22–32)
Calcium: 8.3 mg/dL — ABNORMAL LOW (ref 8.9–10.3)
Chloride: 105 mmol/L (ref 98–111)
Creatinine, Ser: 2.3 mg/dL — ABNORMAL HIGH (ref 0.44–1.00)
GFR calc non Af Amer: 18 mL/min — ABNORMAL LOW (ref >60)
GFR, EST AFRICAN AMERICAN: 21 mL/min — AB (ref >60)
GLUCOSE: 132 mg/dL — AB (ref 70–99)
Potassium: 2.9 mmol/L — ABNORMAL LOW (ref 3.5–5.1)
Sodium: 144 mmol/L (ref 135–145)

## 2018-01-02 LAB — PROCALCITONIN: Procalcitonin: 1.34 ng/mL

## 2018-01-02 LAB — CBC
HCT: 28.4 % — ABNORMAL LOW (ref 36.0–46.0)
Hemoglobin: 8.4 g/dL — ABNORMAL LOW (ref 12.0–15.0)
MCH: 27.9 pg (ref 26.0–34.0)
MCHC: 29.6 g/dL — ABNORMAL LOW (ref 30.0–36.0)
MCV: 94.4 fL (ref 80.0–100.0)
NRBC: 0 % (ref 0.0–0.2)
Platelets: 288 10*3/uL (ref 150–400)
RBC: 3.01 MIL/uL — AB (ref 3.87–5.11)
RDW: 16.8 % — ABNORMAL HIGH (ref 11.5–15.5)
WBC: 15.1 10*3/uL — ABNORMAL HIGH (ref 4.0–10.5)

## 2018-01-02 LAB — GLUCOSE, CAPILLARY
Glucose-Capillary: 105 mg/dL — ABNORMAL HIGH (ref 70–99)
Glucose-Capillary: 109 mg/dL — ABNORMAL HIGH (ref 70–99)
Glucose-Capillary: 161 mg/dL — ABNORMAL HIGH (ref 70–99)
Glucose-Capillary: 212 mg/dL — ABNORMAL HIGH (ref 70–99)
Glucose-Capillary: 217 mg/dL — ABNORMAL HIGH (ref 70–99)
Glucose-Capillary: 65 mg/dL — ABNORMAL LOW (ref 70–99)
Glucose-Capillary: 96 mg/dL (ref 70–99)

## 2018-01-02 LAB — MAGNESIUM
Magnesium: 2 mg/dL (ref 1.7–2.4)
Magnesium: 2 mg/dL (ref 1.7–2.4)

## 2018-01-02 LAB — PHOSPHORUS
Phosphorus: 3.5 mg/dL (ref 2.5–4.6)
Phosphorus: 4 mg/dL (ref 2.5–4.6)

## 2018-01-02 MED ORDER — POTASSIUM CHLORIDE 20 MEQ/15ML (10%) PO SOLN
40.0000 meq | Freq: Once | ORAL | Status: AC
  Administered 2018-01-02: 40 meq
  Filled 2018-01-02: qty 30

## 2018-01-02 MED ORDER — DEXTROSE 10 % IV SOLN
INTRAVENOUS | Status: DC
  Administered 2018-01-02: 04:00:00 via INTRAVENOUS

## 2018-01-02 MED ORDER — POTASSIUM CHLORIDE 20 MEQ/15ML (10%) PO SOLN
40.0000 meq | Freq: Once | ORAL | Status: AC
  Administered 2018-01-02: 40 meq via ORAL
  Filled 2018-01-02: qty 30

## 2018-01-02 MED ORDER — VITAL HIGH PROTEIN PO LIQD
1000.0000 mL | ORAL | Status: DC
  Administered 2018-01-02 – 2018-01-03 (×2): 1000 mL

## 2018-01-02 NOTE — Progress Notes (Signed)
PT Cancellation Note  Patient Details Name: [name] MRN: [id number] DOB: Aug 12, 1927   Cancelled Treatment:    Reason Eval/Treat Not Completed: Medical issues which prohibited therapy, will follow for appropriateness   Duncan Dull 01/02/2018, 11:58 AM

## 2018-01-02 NOTE — Progress Notes (Signed)
NAME:  [name], MRN:  [id number], DOB:  1927/12/08, LOS: 24 ADMISSION DATE:  12/05/2017, CONSULTATION DATE:  01/01/18 REFERRING MD:  Cruzita Lederer  CHIEF COMPLAINT:  Respiratory Distress   Brief History   [name] is a 84 y.o. female who was initially admitted 11/5 with aphasia.  Had imaging that was negative for CVA.  Has had complicated course with FTT, AoC combined heart failure, AoCKD, acute pulmonary edema, respiratory distress.  On 12/2, had recurrent episode of respiratory distress requiring intubation.  History of present illness   Pt is encephelopathic; therefore, this HPI is obtained from chart review. [name] is a 84 y.o. female who has a PMH as outlined below (see "past medical history").  She presented to [facility] 11/5 with aphasia.  On 11/5, son had noticed that she was slurring words; therefore, he brought her to the hospital for further evaluation.  She had CT and MRI which was negative for CVA.  She was found to have UTI and was started on Rocephin which was later changed to ampicillin and later zosyn.  Hospital course was complicated by failure to thrive.  Palliative care consulted 11/13 for goals of care.  Family opted for full code / full scope of care as feared mother would not receive appropriate treatment otherwise (Son is apparently a Copy).  Son requested neurology consultation on 11/20 for FTT as well as decreasing memory.  Neuro felt that her presentation was suggestive of a possible mixed underlying dementia process with cognitive worsening precipitated by intercurrent illness.  On 11/21, she had an episode of acute respiratory distress.  Was found to have acute pulmonary edema as well as elevated troponin felt to be due to NSTEMI.  She was treated with heparin, statin, beta-blocker, Plavix and Lasix.  An echocardiogram was obtained and demonstrated EF 30% with G2DD.  On 12/2, she had recurrent episode to the point that she was almost agonal.  PCCM was  called in consultation.  I had lengthy discussion with pt's daughter and son (Copy).  Son frustrated that pt not on nitro gtt, feels that pt has deteriorated "because of what we have done to her".  Of note, she apparently was recently diagnosed with achalasia after 50 lb weight loss over the past 1 year.  She had been evaluated by GI and had an appointment as an outpatient on 11/6.  Due to this, she has had trouble eating and maintaining weight; hence, the 50lb weight loss and FTT.  Past Medical History  HTN, combined heart failure, CAD, cardiomyopathy, CKD, DM II, FTT.  Significant Hospital Events   11/5 > admit. 11/10 > G tube placed by IR. 11/13 > PMT consulted. 11/20 > neuro consult.  Rapid response for respiratory distress, treated with lasix. 11/21 > cardiology consult for NSTEMI. 11/23 > rapid response for respiratory distress, treated with lasix and morphine. 11/27 > ENT consulted for stridor, flexible laryngoscopy negative. 12/1 > rapid response for respiratory distress, treated with morphine. 12/2 > PCCM consulted for agonal respirations, transferred to ICU for intubation. 12/3/>> Stable right atelectasis and effusion  Consults:  Radiology. Palliative Care. Surgery. Neurology. Cardiology. ENT. PCCM. Nephrology.  Procedures:  G tube 11/10 > ETT 12/2 >   Significant Diagnostic Tests:  CT head 11/5 > chronic ischemic disease. MRI brain 11/5 > no acute process. Abd [place] 11/19 > mod size periumbilical hernia with solid density within.   Echo 11/21 > EF 30%, G2DD. Flex laryngoscopy 11/27 > negative.  Micro  Data:  Urine 11/5 > enterococcus faecalis. Blood 11/22 > negative. Urine 11/23 > enterococcus faecalis. Blood 12/2 > Sputum 12/2 > Moderate gram negative rods  Antimicrobials:  Ceftriaxone 11/5 > 11/6. Ampicillin 11/6 > 11/8. Zosyn 11/24 > 11/29.  12/2 >   Interim history/subjective:  Off sedation, no pressors, does not follow commands  Objective:    Blood pressure (!) 118/51, pulse 73, temperature 98.9 F (37.2 C), temperature source Oral, resp. rate 16, height 5\' 3"  (1.6 m), weight 59 kg, SpO2 100 %.    Vent Mode: PRVC FiO2 (%):  [50 %] 50 % Set Rate:  [16 bmp] 16 bmp Vt Set:  [410 mL] 410 mL PEEP:  [5 cmH20] 5 cmH20 Plateau Pressure:  [17 cmH20-20 cmH20] 19 cmH20   Intake/Output Summary (Last 24 hours) at 01/02/2018 1025 Last data filed at 01/02/2018 0800 Gross per 24 hour  Intake 350.29 ml  Output 1900 ml  Net -1549.71 ml   Filed Weights   12/28/17 0452 12/29/17 0300 12/31/17 0600  Weight: 58.1 kg 58 kg 59 kg    Examination: General: Adult female, chronically ill appearing, off sedation , does not follow commands Neuro: Arouses to stimulation, non-purposeful, MAE x 4 HEENT: [place]/AT. Sclerae anicteric, no LAD, ETT, OG .  Cardiovascular: S1, S2, RRR, no M/R/G.  Lungs: Bilateral excursion, Respirations unlabored on full vent support.Clear throughout. Abdomen: PEG in place.  BS x 4, soft, NT/ND.   Musculoskeletal: No obvious deformities, no edema.  Bilateral ankles in pressure relief boots. Skin: Sacral pressure sores per RN.  Otherwise skin is warm, no rashes, no lesions.  Assessment & Plan:   Acute hypoxic respiratory failure - presumed due to acute pulmonary edema in the setting of AoC / decompensated combined heart failure. Net negative 4.7 L Plan - Titrate FiO2 and PEEP as able - Bronchial hygiene. - Trend CXR. - Wean as able - Lasix as ordered - Needs continued goals of care discussions with family (primarily the son who is a Copy).  Probable aspiration - copious thick dried secretions noted in oropharynx and around vocal cords during intubation. Leukocytosis Sputum Culture with moderate gram negative rods - Continue zosyn, await sensitivities - Trend fever and WBC - Culture as is clinically indicated - Follow micro - CXR 12/4  AoC / decompensated combined heart failure - echo from 11/21 with EF  30% and G2DD.  - Cardiology following. - Trend troponin - Continue diuresis, goal neg balance (currently - 4L). - Continue plavix. - D/c hydralazine. - Hold preadmission amlodipine, imdur, lopressor for now.  NSTEMI - s/p heparin gtt. - Continue nitro paste. - Appreciate cardiology assist  AoCKD - had worsening after diuresis following previous episode of respiratory distress. creatinine likely overestimates her GFR due to age and sarcopenia  - Nephrology consulted by [facility].>> not candidate for HD due to debilitated functional status and comorbid conditions - D/c free water. - Continue lasix for now. - Avoid nephrotoxic medications - Maintain renal perfusion - Trend BMET - Trend UO  Pseudohypocalcemia - corrects to 10.2. - Assess ionized calcium.>> ordered 12/3  Hypokalemia Will replete as continued diuresis Trend BMET  DM II. Hypoglycemia - Continue D10 for now - advance OG - Start TF>> evaluate if ok to use PEG - Continue SSI,  - Hold Levemir for now - Hold preadmission glipizide.  Dysphagia. - On D 10. - Will start TF per OG for now, transition to PEG is ok to use  AoC anemia. - Transfuse for Hgb <  7. - Trend CBC - Monitor for obvious bleeding  Acute encephalopathy - unclear etiology. FTT - per imaging and neuro, picture suggestive of underlying vascular dementia.  Palliative care had been consulted and family opted for continued full scope of treatment. - sedation off - D/c mirtazapine, morphine, quetiapine. - Continue goals of care discussions.  Best Practice:  Diet: TF's. Pain/Anxiety/Delirium protocol (if indicated): Fentanyl gtt / Midazolam PRN.  RASS goal 0. VAP protocol (if indicated): In place. DVT prophylaxis: SCD's. GI prophylaxis: Pantoprazole. Glucose control: SSI. Mobility: Bedrest. Code Status: Full. Family Communication:  pt's daughter at bedside and son over the phone on 12/2.  No family at bedside 12/3. Of note :Son is a Copy  and questions why pt is not on a nitroglycerin infusion.  He also states that "[facility] are the ones who got her in this state from the 27L positive fluid balance". Disposition: ICU.  Labs   CBC: Recent Labs  Lab 12/29/17 0406 12/30/17 0627 12/31/17 0645 01/01/18 0434 01/02/18 0340  WBC 9.3 9.7 10.2 16.1* 15.1*  HGB 8.6* 8.6* 8.2* 9.6* 8.4*  HCT 28.7* 29.0* 27.9* 32.7* 28.4*  MCV 93.2 95.4 96.5 95.6 94.4  PLT 276 251 278 355 941   Basic Metabolic Panel: Recent Labs  Lab 12/29/17 0406 12/30/17 0627 12/31/17 0645 01/01/18 0434 01/02/18 0340  NA 139 143 144 142 144  K 4.1 3.9 3.8 4.0 2.9*  CL 101 100 102 100 105  CO2 32 32 31 29 29   GLUCOSE 127* 98 117* 173* 132*  BUN 65* 56* 51* 51* 47*  CREATININE 2.41* 2.21* 2.10* 2.10* 2.30*  CALCIUM 8.1* 8.1* 7.9* 8.4* 8.3*  MG  --   --   --   --  2.0  PHOS  --   --   --   --  3.5   GFR: Estimated Creatinine Clearance: 14 mL/min (A) (by C-G formula based on SCr of 2.3 mg/dL (H)). Recent Labs  Lab 12/30/17 0627 12/31/17 0645 12/31/17 2214 01/01/18 0434 01/01/18 0802 01/01/18 1851 01/02/18 0340  PROCALCITON  --   --   --   --   --  1.50 1.34  WBC 9.7 10.2  --  16.1*  --   --  15.1*  LATICACIDVEN  --   --  1.9 1.3 1.0  --   --    Liver Function Tests: Recent Labs  Lab 12/27/17 0400 01/01/18 0434  AST  --  18  ALT  --  17  ALKPHOS  --  47  BILITOT  --  0.9  PROT  --  6.3*  ALBUMIN 1.7* 1.7*   No results for input(s): LIPASE, AMYLASE in the last 168 hours. No results for input(s): AMMONIA in the last 168 hours. ABG    Component Value Date/Time   PHART 7.457 (H) 12/31/2017 2120   PCO2ART 40.5 12/31/2017 2120   PO2ART 56.2 (L) 12/31/2017 2120   HCO3 31.3 (H) 01/01/2018 1520   TCO2 22 12/05/2017 1201   ACIDBASEDEF 2.8 (H) 12/20/2017 2235   O2SAT 58.4 01/01/2018 1520    Coagulation Profile: No results for input(s): INR, PROTIME in the last 168 hours. Cardiac Enzymes: Recent Labs  Lab 01/01/18 0434  01/01/18 1851  TROPONINI 1.03* 0.94*   HbA1C: Hgb A1c MFr Bld  Date/Time Value Ref Range Status  12/24/2017 01:13 PM 5.9 (H) 4.8 - 5.6 % Final    Comment:    (NOTE) Pre diabetes:  5.7%-6.4% Diabetes:              >6.4% Glycemic control for   <7.0% adults with diabetes   11/04/2017 11:10 AM 9.0 (H) 4.8 - 5.6 % Final    Comment:    (NOTE) Pre diabetes:          5.7%-6.4% Diabetes:              >6.4% Glycemic control for   <7.0% adults with diabetes    CBG: Recent Labs  Lab 01/01/18 2338 01/02/18 0017 01/02/18 0333 01/02/18 0511 01/02/18 0756  GLUCAP 60* 96 65* 105* 109*    Review of Systems:   Unable to obtain as pt is encephalopathic.  Past medical history  She,  has a past medical history of Acute renal injury ([place]) (07/2016), Anginal pain ([place]) (2004; 2011), CAD (coronary artery disease), Cardiomyopathy, Chronic kidney disease (CKD), stage III (moderate) ([place]), History of blood transfusion (1960s), HTN (hypertension), Pleural effusion (2011), Shortness of breath, Stroke ([place]) (09/19/2016), and Type II diabetes mellitus ([place]).   Surgical History    Past Surgical History:  Procedure Laterality Date  . BOTOX INJECTION  11/20/2017   Procedure: BOTOX INJECTION;  Surgeon: Doran Stabler, MD;  Location: [facility];  Service: Gastroenterology;;  . CATARACT EXTRACTION W/ INTRAOCULAR LENS  IMPLANT, BILATERAL Bilateral   . CORONARY ANGIOPLASTY WITH STENT PLACEMENT  2004   "LAD & one of the circumflex"  . CORONARY ARTERY BYPASS GRAFT  2011   CABG X4  . ESOPHAGOGASTRODUODENOSCOPY (EGD) WITH PROPOFOL N/A 11/20/2017   Procedure: ESOPHAGOGASTRODUODENOSCOPY (EGD) WITH PROPOFOL;  Surgeon: Doran Stabler, MD;  Location: [facility];  Service: Gastroenterology;  Laterality: N/A;  . IR GASTROSTOMY TUBE MOD SED  12/10/2017     Social History   reports that she has never smoked. She has never used smokeless tobacco. She reports that she does not drink alcohol or  use drugs.   Family history   Her family history includes Diabetes (age of onset: 104) in her father; Unexplained death (age of onset: 6) in her mother. There is no history of CAD.   Allergies Allergies  Allergen Reactions  . Contrast Media [Iodinated Diagnostic Agents]     Sensitivity, kidney issues     Home meds  Prior to Admission medications   Medication Sig Start Date End Date Taking? Authorizing Provider  acetaminophen (TYLENOL) 500 MG tablet Take 500 mg by mouth every 6 (six) hours as needed for headache.    Yes [provider]  amLODipine (NORVASC) 10 MG tablet TAKE ONE TABLET BY MOUTH ONCE DAILY. Patient taking differently: Take 10 mg by mouth daily.  07/27/17  Yes Minus Breeding, MD  atorvastatin (LIPITOR) 40 MG tablet Take 1 tablet (40 mg total) by mouth daily. Patient taking differently: Take 40 mg by mouth daily at 6 PM.  04/25/17  Yes Garvin Fila, MD  cholecalciferol (VITAMIN D) 400 units TABS tablet Take 1 tablet (400 Units total) by mouth daily. 10/06/16  Yes Angiulli, Lavon Paganini, PA-C  ferrous sulfate 220 (44 Fe) MG/5ML solution Take 10 mLs by mouth daily. 10/25/17  Yes [provider]  glipiZIDE (GLUCOTROL) 5 MG tablet Take 2 tablets (10 mg total) by mouth daily before breakfast. 11/24/17  Yes Nita Sells, MD  isosorbide mononitrate (IMDUR) 30 MG 24 hr tablet TAKE 1 TABLET BY MOUTH ONCE DAILY Patient taking differently: Take 30 mg by mouth daily.  09/22/17  Yes Minus Breeding, MD  megestrol (MEGACE)  20 MG tablet Take 20 mg by mouth at bedtime.   Yes [provider]  metoprolol tartrate 75 MG TABS Take 75 mg by mouth 2 (two) times daily. 11/24/17  Yes Nita Sells, MD  mirtazapine (REMERON) 15 MG tablet Take 7.5 mg by mouth at bedtime.   Yes [provider]  nitroGLYCERIN (NITROSTAT) 0.4 MG SL tablet Place 1 tablet (0.4 mg total) under the tongue every 5 (five) minutes x 3 doses as needed for chest pain. 10/08/16  Yes  Rosita Fire, Brittainy M, PA-C  pantoprazole (PROTONIX) 40 MG tablet Take 1 tablet (40 mg total) by mouth 2 (two) times daily. 11/24/17  Yes Nita Sells, MD  polyethylene glycol (MIRALAX / GLYCOLAX) packet Take 17 g by mouth daily. 10/16/16  Yes Regalado, Belkys A, MD  senna (SENOKOT) 8.6 MG TABS tablet Take 1 tablet (8.6 mg total) by mouth daily. Patient taking differently: Take 1 tablet by mouth at bedtime.  10/16/16  Yes Regalado, Belkys A, MD  feeding supplement, ENSURE ENLIVE, (ENSURE ENLIVE) LIQD Take 237 mLs by mouth 2 (two) times daily between meals. Patient not taking: Reported on 12/05/2017 11/24/17   Nita Sells, MD  megestrol (MEGACE) 400 MG/10ML suspension Take 10 mLs (400 mg total) by mouth daily. Patient not taking: Reported on 12/05/2017 11/24/17   Nita Sells, MD  protein supplement shake (PREMIER PROTEIN) LIQD Take 325 mLs (11 oz total) by mouth daily. Patient not taking: Reported on 11/04/2017 10/15/16   Elmarie Shiley, MD    Critical care time:     Magdalen Spatz, AGACNP-BC [facility] Pager:  [phone] 01/02/2018, 10:25 AM

## 2018-01-02 NOTE — Progress Notes (Signed)
Progress Note  Patient Name: [name] Date of Encounter: 01/02/2018  Primary Cardiologist: Minus Breeding, MD   Subjective   Intubated, arousable but not purposeful actions.  Inpatient Medications    Scheduled Meds: . sodium chloride   Intravenous Once  . alteplase  2 mg Intracatheter Once  . atorvastatin  40 mg Per Tube q1800  . chlorhexidine gluconate (MEDLINE KIT)  15 mL Mouth Rinse BID  . cholecalciferol  400 Units Per Tube Daily  . clopidogrel  75 mg Per Tube Daily  . feeding supplement (VITAL HIGH PROTEIN)  1,000 mL Per Tube Q24H  . ferrous sulfate  300 mg Per Tube Daily  . furosemide  40 mg Intravenous BID  . insulin aspart  0-15 Units Subcutaneous Q4H  . insulin detemir  10 Units Subcutaneous BID  . levalbuterol  0.63 mg Nebulization BID   And  . ipratropium  0.5 mg Nebulization BID  . mouth rinse  15 mL Mouth Rinse 10 times per day  . metoCLOPramide (REGLAN) injection  5 mg Intravenous Q6H  . multivitamin  15 mL Per Tube Daily  . nitroGLYCERIN  1 inch Topical Q6H  . pantoprazole (PROTONIX) IV  40 mg Intravenous QHS  . sennosides  5 mL Per Tube QHS   Continuous Infusions: . dextrose 30 mL/hr at 01/02/18 0345  . fentaNYL infusion INTRAVENOUS 50 mcg/hr (01/02/18 0800)  . piperacillin-tazobactam (ZOSYN)  IV 2.25 g (01/02/18 1521)   PRN Meds: acetaminophen, fentaNYL, fentaNYL, hydrALAZINE, levalbuterol, loperamide HCl, midazolam, midazolam, nitroGLYCERIN, ondansetron (ZOFRAN) IV, sodium chloride flush   Vital Signs    Vitals:   01/02/18 0820 01/02/18 1139 01/02/18 1140 01/02/18 1200  BP:  (!) 128/54  138/65  Pulse:  83  76  Resp:  16  16  Temp:   98.3 F (36.8 C)   TempSrc:   Oral   SpO2: 100% 100% 100% 100%  Weight:      Height:        Intake/Output Summary (Last 24 hours) at 01/02/2018 1534 Last data filed at 01/02/2018 0800 Gross per 24 hour  Intake 350.29 ml  Output 1900 ml  Net -1549.71 ml   Filed Weights   12/28/17 0452 12/29/17 0300  12/31/17 0600  Weight: 58.1 kg 58 kg 59 kg    Physical Exam   GEN: move all extremities but nonpurposeful, opens eyes, intubated Neck: No JVD Cardiac: rrr Respiratory: coarse bilateral breath sounds. GI: Soft, nontender, non-distended  MS: no edema; No deformity. Neuro:  does not respond to commands Psych: intubated  Labs    Chemistry Recent Labs  Lab 12/27/17 0400  12/31/17 0645 01/01/18 0434 01/02/18 0340  NA 137   < > 144 142 144  K 5.7*   < > 3.8 4.0 2.9*  CL 100   < > 102 100 105  CO2 29   < > _0 GLUCOSE 180*   < > 117* 173* 132*  BUN 70*   < > 51* 51* 47*  CREATININE 2.64*   < > 2.10* 2.10* 2.30*  CALCIUM 8.8*   < > 7.9* 8.4* 8.3*  PROT  --   --   --  6.3*  --   ALBUMIN 1.7*  --   --  1.7*  --   AST  --   --   --  18  --   ALT  --   --   --  17  --   ALKPHOS  --   --   --  47  --   BILITOT  --   --   --  0.9  --   GFRNONAA 16*   < > 20* 20* 18*  GFRAA 18*   < > 24* 24* 21*  ANIONGAP 8   < > _0 < > = values in this interval not displayed.     Hematology Recent Labs  Lab 12/31/17 0645 01/01/18 0434 01/02/18 0340  WBC 10.2 16.1* 15.1*  RBC 2.89* 3.42* 3.01*  HGB 8.2* 9.6* 8.4*  HCT 27.9* 32.7* 28.4*  MCV 96.5 95.6 94.4  MCH 28.4 28.1 27.9  MCHC 29.4* 29.4* 29.6*  RDW 17.1* 17.0* 16.8*  PLT 278 355 288    Cardiac Enzymes Recent Labs  Lab 01/01/18 0434 01/01/18 1851  TROPONINI 1.03* 0.94*   No results for input(s): TROPIPOC in the last 168 hours.   BNP Recent Labs  Lab 01/01/18 0434  BNP >4,500.0*     DDimer No results for input(s): DDIMER in the last 168 hours.   Radiology    Portable Chest Xray  Result Date: 01/02/2018 CLINICAL DATA:  Respiratory failure EXAM: PORTABLE CHEST 1 VIEW COMPARISON:  01/01/2018 FINDINGS: Cardiac shadow is mildly enlarged but stable. Postsurgical changes are again seen. Endotracheal tube, nasogastric catheter and PICC line are again seen. The nasogastric catheter proximal side port lies in  the distal esophagus. This could be advanced several cm as necessary. Right-sided pleural effusion and atelectatic changes are noted stable from the previous day. No other new focal abnormality is seen. IMPRESSION: Stable right basilar atelectasis and effusion. Electronically Signed   By: Inez Catalina M.D.   On: 01/02/2018 08:50   Dg Chest Port 1 View  Result Date: 01/01/2018 CLINICAL DATA:  Respiratory failure. Follow-up exam. Intubated patient. EXAM: PORTABLE CHEST 1 VIEW COMPARISON:  12/31/2017 and older exams. FINDINGS: New endotracheal tube has its tip projecting 13 mm above the [place]. New nasal/orogastric tube passes below the diaphragm well into the stomach. Interstitial thickening has improved compared to the previous day's study. There is persistent hazy opacity at both lung bases obscuring the hemidiaphragms consistent with a small pleural effusions. No new lung abnormalities. No pneumothorax. Right PICC is stable. IMPRESSION: 1. Endotracheal tube and nasal/orogastric tube are well positioned. 2. Improved lung aeration with decreased bilateral interstitial edema. Electronically Signed   By: Lajean Manes M.D.   On: 01/01/2018 12:59   Dg Chest Port 1 View  Result Date: 12/31/2017 CLINICAL DATA:  Initial evaluation for acute respiratory distress. EXAM: PORTABLE CHEST 1 VIEW COMPARISON:  Previous radiograph from 12/25/2017 FINDINGS: Median sternotomy wires underlying CABG markers and surgical clips, stable. Right PICC catheter in place with tip overlying distal SVC. Moderate cardiomegaly unchanged. Mediastinal silhouette within normal limits. Aortic atherosclerosis. Lungs mildly hypoinflated. Moderate diffuse pulmonary interstitial edema has slightly worsened from previous. Associated bibasilar layering pleural effusions, right greater than left, also worsened. Increased bibasilar opacities favored to reflect atelectasis and/or edema. No pneumothorax. Osseous structures unchanged. IMPRESSION: 1.  Interval worsening in moderate diffuse pulmonary interstitial edema with worsened right greater than left pleural effusions. 2. Increased superimposed bibasilar opacities favored to reflect atelectasis and/or edema. 3. Stable cardiomegaly with sequelae of prior CABG. 4. Right PICC catheter in place with tip overlying the distal SVC. Electronically Signed   By: Jeannine Boga M.D.   On: 12/31/2017 21:37    Assessment & Plan   Principal Problem:   Dysarthria Active Problems:   Diabetes mellitus ([place])   Hyperlipidemia  Essential hypertension   Coronary atherosclerosis   Stroke (cerebrum) (HCC)   Stage 3 chronic kidney disease ([place])   Acute lower UTI   Adult failure to thrive   UTI (urinary tract infection)   DNR (do not resuscitate) discussion   Palliative care by specialist   CHF (congestive heart failure) ([place])   Aspiration pneumonia due to gastric secretions (HCC)   NSTEMI (non-ST elevated myocardial infarction) ([place])   Altered mental status   Acute respiratory failure with hypoxia ([place])   While blood pressure is still mildly elevated, it is challenging to control in the setting of renal failure. Defer to nephrology for best agents to control BP to avoid renal injury.   Nitro paste can likely be continued as it may have diminishing effect for blood pressure over serial days.   At this time, no further cardiovascular therapies are required. Goals of care continuing to be addressed by primary service. [facility] will sign off.     For questions or updates, please contact [place] Please consult [email or web address] for contact info under        Signed, Elouise Munroe, MD  01/02/2018, 3:34 PM

## 2018-01-02 NOTE — Progress Notes (Signed)
Hypoglycemic Event  CBG: 60  Treatment: 63mL of Dextrose 50% IV given  Symptoms: UTA (patient sedated)  Follow-up CBG: Time:0013 CBG Result: 96  Possible Reasons for Event:  Patient is not receiving nutritional supplements. NPO  Comments/MD notified: MD notified    Orpah Greek

## 2018-01-02 NOTE — Progress Notes (Signed)
Nutrition Follow-up  DOCUMENTATION CODES:   Non-severe (moderate) malnutrition in context of chronic illness, Underweight  INTERVENTION:   Tube Feeding: Begin trickle TF of Vital High Protein at rate of 20 ml/hr as pt with difficulty tolerating TF prior to intubation Goal: Vital High Protein @ 40 ml/hr Provides 1080 kcals, 95 g of protein and 806 mL of free water Meets 100% protein needs, calorie needs   NUTRITION DIAGNOSIS:   Moderate Malnutrition related to chronic illness(dysphagia 2/2 to achalasia) as evidenced by energy intake < or equal to 75% for > or equal to 1 month, mild fat depletion, moderate fat depletion, mild muscle depletion, moderate muscle depletion, energy intake < or equal to 50% for > or equal to 1 month.  Being addressed via nutrition support  GOAL:   Patient will meet greater than or equal to 90% of their needs  Progress  MONITOR:   PO intake, Supplement acceptance, Labs, Weight trends, Skin, I & O's  REASON FOR ASSESSMENT:   Consult Assessment of nutrition requirement/status, Poor PO  ASSESSMENT:   [name] is a 84 y.o. female with medical history significant for DM2, HTN, HLD, CAD s/p CABG, h/o CVA, CKD3, who was recently diagnosed with achalasia after a 50 lb weight loss over the past year. She was treated with botox earlier in October and sent to SNF with the hopes she would be able to swallow her food. She has not done well since that time, has only been able to drink small amounts of milk. Son is a Management consultant in [place] and has been visiting. Yesterday he noticed she was stuttering, having difficulty getting words out, and progressed until she seemed to have total aphasia last night. She does report having dysuria and some lower abd pain. No N/V. Has a good appetite but avoids food because of the obstruction. Megace and remeron have not helped. Process of being worked up for G tube has been initiated and pt reportedly has appt with [facility] tomorrow morning. Son feels like her weight loss and FTT could be reversible if she gets a G tube as she was reportedly very functional a month ago, walking with a walker and performing all of her ADLs.    11/7 - per IR notes, imaging revealed moderate sized hiatal hernia and colon in upper abdomen, recommending evaluating anatomy with fluoroscopy 11/8 - PICC placed 11/10 - TPN started, IR placed G-tube 11/11 - G-tube cleared for use by RI, TF initiated 11/13 - TPN d/c 11/20 - transitioned to bolus feedings 11/23 - s/p BSE, recommend continued NPO due to AMS, emesis with bolus feedings, rapid response called 11/25 - rapid response, tube feeds held and restarted in the afternoon at 20 ml/hr 11/27 - per MD, tube feeds at 35 ml/hr during the daytime and 30 ml/hr overnight 12/1 - rapid response for increased WOB 12/2 - emesis, TF turned off, intubated  Pt with decline on 12/2 requiring transfer to ICU and intubation  Prior to intubation on 12/2, pt has not been tolerating TF well.   Patient is currently intubated on ventilator support, fentanyl for sedation MV: 6.5 L/min Temp (24hrs), Avg:98.7 F (37.1 C), Min:98.3 F (36.8 C), Max:99 F (37.2 C)  Hypolgycemic off TF, D10 infusion started  +BM, abdomen soft, BS present  Labs: potassium 2.9 (L), CBGs 60-109 Meds: reglan, ss novolog, levemir, KCl   Diet Order:   Diet Order            Diet NPO time  specified  Diet effective now              EDUCATION NEEDS:   Education needs have been addressed  Skin:  Skin Assessment: Skin Integrity Issues: Skin Integrity Issues:: Stage II Stage II: left buttocks, sacrum Other: partial thickness wounds to bilateral buttocks (likely from adhesive)  Last BM:  12/2 (small)  Height:   Ht Readings from Last 1 Encounters:  12/20/17 5\' 3"  (1.6 m)    Weight:   Wt Readings from Last 1 Encounters:  12/31/17 59 kg    Ideal Body Weight:  52.3 kg  BMI:  Body mass index is 23.03  kg/m.  Estimated Nutritional Needs:   Kcal:  1100 kcals   Protein:  75-100 g  Fluid:  >/= 1.2 L    Kerman Passey MS, RD, LDN, CNSC [phone] Pager  [phone] Weekend/On-Call Pager

## 2018-01-02 NOTE — Progress Notes (Signed)
Hypoglycemic Event  CBG: 65  Treatment: 25 mL of Dextrose 50% IV given  Symptoms: UTA (Patient sedated)  Follow-up CBG: Time: 1959 CBG Result:77  Possible Reasons for Event: Patient is not receiving nutritional supplements. NPO  Comments/MD notified:Will continue to monitor    Orpah Greek

## 2018-01-02 NOTE — Progress Notes (Signed)
eLink Physician-Brief Progress Note Patient Name: [name] DOB: 12-Feb-1927 MRN: [id number]   Date of Service  01/02/2018  HPI/Events of Note  K+ = 2.9 and Creatinine = 2.30.   eICU Interventions  Will replace K+.      Intervention Category Major Interventions: Electrolyte abnormality - evaluation and management  Aayushi Solorzano Eugene 01/02/2018, 5:43 AM

## 2018-01-02 NOTE — Progress Notes (Addendum)
[facility] Progress Note    Assessment/ Plan:   1.  Acute kidney injury superimposed on CKD 3: I suspect this is a multifactorial combination of possible sepsis, CHF exacerbation, and a clinical manifestation of overall multisystem organ failure.  Continue Lasix 40 IV BID.  Of note, she is an exceptionally poor dialysis candidate due to debilitated functional status and comorbid conditions.  Please note that her creatinine likely overestimates her GFR due to age and sarcopenia so avoidance of nephrotoxic medications will be of utmost importance.    2.  Acute hypoxic RF: aspiration + vol overload.  Vent management and antibiotics per primary.  CXR still looks wet this AM  3.  FTT/Achalasia: s/p G-tube, TF stopped, now on D10 gtt    4.  Severe malnutrition: albumin 1.7  5.  Sepsis: cultures taken, abx per primary  6.  NSTEMI: had hep gtt, on nitropaste  7.  Hypokalemia: getting repletion prn  8.  Dispo: ICU, appreciate pall care c/s  Subjective:    Intubated yesterday and now in ICU.  Robust UOP with IV Lasix.  Hypoglycemic and now on D10 gtt   Objective:   BP (!) 118/51 (BP Location: Left Arm)   Pulse 73   Temp 98.9 F (37.2 C) (Oral)   Resp 16   Ht 5' 3"  (1.6 m)   Wt 59 kg   SpO2 100%   BMI 23.03 kg/m   Intake/Output Summary (Last 24 hours) at 01/02/2018 3151 Last data filed at 01/02/2018 0800 Gross per 24 hour  Intake 350.29 ml  Output 1900 ml  Net -1549.71 ml   Weight change:   Physical Exam: GEN frail, elderly woman, intubated, sedated HEENT doesn't open eyes NECK JVD improved PULM diffuse crackles improved CV tachycardic ABD + G-tube, nontender EXT sarcopenic with 3rd spacing, slightly improved from yesterday NEURO doesn't respond to commands  Imaging: Dg Chest Port 1 View  Result Date: 01/01/2018 CLINICAL DATA:  Respiratory failure. Follow-up exam. Intubated patient. EXAM: PORTABLE CHEST 1 VIEW COMPARISON:  12/31/2017 and older  exams. FINDINGS: New endotracheal tube has its tip projecting 13 mm above the Carina. New nasal/orogastric tube passes below the diaphragm well into the stomach. Interstitial thickening has improved compared to the previous day's study. There is persistent hazy opacity at both lung bases obscuring the hemidiaphragms consistent with a small pleural effusions. No new lung abnormalities. No pneumothorax. Right PICC is stable. IMPRESSION: 1. Endotracheal tube and nasal/orogastric tube are well positioned. 2. Improved lung aeration with decreased bilateral interstitial edema. Electronically Signed   By: Lajean Manes M.D.   On: 01/01/2018 12:59   Dg Chest Port 1 View  Result Date: 12/31/2017 CLINICAL DATA:  Initial evaluation for acute respiratory distress. EXAM: PORTABLE CHEST 1 VIEW COMPARISON:  Previous radiograph from 12/25/2017 FINDINGS: Median sternotomy wires underlying CABG markers and surgical clips, stable. Right PICC catheter in place with tip overlying distal SVC. Moderate cardiomegaly unchanged. Mediastinal silhouette within normal limits. Aortic atherosclerosis. Lungs mildly hypoinflated. Moderate diffuse pulmonary interstitial edema has slightly worsened from previous. Associated bibasilar layering pleural effusions, right greater than left, also worsened. Increased bibasilar opacities favored to reflect atelectasis and/or edema. No pneumothorax. Osseous structures unchanged. IMPRESSION: 1. Interval worsening in moderate diffuse pulmonary interstitial edema with worsened right greater than left pleural effusions. 2. Increased superimposed bibasilar opacities favored to reflect atelectasis and/or edema. 3. Stable cardiomegaly with sequelae of prior CABG. 4. Right PICC catheter in place with tip overlying the distal SVC. Electronically Signed  By: Jeannine Boga M.D.   On: 12/31/2017 21:37    Labs: BMET Recent Labs  Lab 12/27/17 0400 12/28/17 0502 12/29/17 0406 12/30/17 0627  12/31/17 0645 01/01/18 0434 01/02/18 0340  NA 137 139 139 143 144 142 144  K 5.7* 5.1 4.1 3.9 3.8 4.0 2.9*  CL 100 99 101 100 102 100 105  CO2 29 30 32 32 31 29 29   GLUCOSE 180* 185* 127* 98 117* 173* 132*  BUN 70* 68* 65* 56* 51* 51* 47*  CREATININE 2.64* 2.67* 2.41* 2.21* 2.10* 2.10* 2.30*  CALCIUM 8.8* 8.8* 8.1* 8.1* 7.9* 8.4* 8.3*  PHOS  --   --   --   --   --   --  3.5   CBC Recent Labs  Lab 12/30/17 0627 12/31/17 0645 01/01/18 0434 01/02/18 0340  WBC 9.7 10.2 16.1* 15.1*  HGB 8.6* 8.2* 9.6* 8.4*  HCT 29.0* 27.9* 32.7* 28.4*  MCV 95.4 96.5 95.6 94.4  PLT 251 278 355 288    Medications:    . sodium chloride   Intravenous Once  . alteplase  2 mg Intracatheter Once  . atorvastatin  40 mg Per Tube q1800  . chlorhexidine gluconate (MEDLINE KIT)  15 mL Mouth Rinse BID  . cholecalciferol  400 Units Per Tube Daily  . clopidogrel  75 mg Per Tube Daily  . dextrose      . ferrous sulfate  300 mg Per Tube Daily  . furosemide  40 mg Intravenous BID  . insulin aspart  0-15 Units Subcutaneous Q4H  . insulin detemir  10 Units Subcutaneous BID  . levalbuterol  0.63 mg Nebulization BID   And  . ipratropium  0.5 mg Nebulization BID  . mouth rinse  15 mL Mouth Rinse 10 times per day  . metoCLOPramide (REGLAN) injection  5 mg Intravenous Q6H  . multivitamin  15 mL Per Tube Daily  . nitroGLYCERIN  1 inch Topical Q6H  . pantoprazole (PROTONIX) IV  40 mg Intravenous QHS  . sennosides  5 mL Per Tube QHS      Madelon Lips, MD 01/02/2018, 8:22 AM

## 2018-01-02 NOTE — Progress Notes (Signed)
eLink Physician-Brief Progress Note Patient Name: [name] DOB: Apr 12, 1927 MRN: [id number]   Date of Service  01/02/2018  HPI/Events of Note  Hyperglycemia - Patient started on enteral nutrition today. Blood glucose now = 212-217. Patient is still on D10W IV infusion.   eICU Interventions  Will order: 1. D/C D10W IV infusion.      Intervention Category Major Interventions: Hyperglycemia - active titration of insulin therapy  Lysle Dingwall 01/02/2018, 10:17 PM

## 2018-01-02 NOTE — Progress Notes (Signed)
eLink Physician-Brief Progress Note Patient Name: [name] DOB: 04-03-1927 MRN: [id number]   Date of Service  01/02/2018  HPI/Events of Note  Hypoglycemia X 2 - Blood glucose = 65 and 60.  eICU Interventions  Will order: 1. D10W to run IV at 30 mL/hour.      Intervention Category Major Interventions: Other:  [name],[name] Cornelia Copa 01/02/2018, 1:22 AM

## 2018-01-02 NOTE — Progress Notes (Signed)
Speech Language Pathology Discharge Patient Details Name: [name] MRN: [id number] DOB: [birth year] Today's Date: 01/02/2018 Time:  -     Patient discharged from SLP services secondary to medical decline - will need to re-order SLP to resume therapy services. Intubated.  Please see latest therapy progress note for current level of functioning and progress toward goals.    Progress and discharge plan discussed with patient and/or caregiver: Patient unable to participate in discharge planning and no caregivers available  GO     Houston Siren 01/02/2018, 8:23 AM   Orbie Pyo Colvin Caroli.Ed Risk analyst [phone] Office [phone]

## 2018-01-03 ENCOUNTER — Inpatient Hospital Stay (HOSPITAL_COMMUNITY): Payer: Medicare Other

## 2018-01-03 LAB — CBC
HCT: 28.1 % — ABNORMAL LOW (ref 36.0–46.0)
Hemoglobin: 8.2 g/dL — ABNORMAL LOW (ref 12.0–15.0)
MCH: 28.2 pg (ref 26.0–34.0)
MCHC: 29.2 g/dL — ABNORMAL LOW (ref 30.0–36.0)
MCV: 96.6 fL (ref 80.0–100.0)
Platelets: 277 10*3/uL (ref 150–400)
RBC: 2.91 MIL/uL — ABNORMAL LOW (ref 3.87–5.11)
RDW: 16.8 % — AB (ref 11.5–15.5)
WBC: 16 10*3/uL — ABNORMAL HIGH (ref 4.0–10.5)
nRBC: 0 % (ref 0.0–0.2)

## 2018-01-03 LAB — COMPREHENSIVE METABOLIC PANEL
ALT: 12 U/L (ref 0–44)
AST: 17 U/L (ref 15–41)
Albumin: 1.5 g/dL — ABNORMAL LOW (ref 3.5–5.0)
Alkaline Phosphatase: 48 U/L (ref 38–126)
Anion gap: 10 (ref 5–15)
BUN: 44 mg/dL — AB (ref 8–23)
CO2: 28 mmol/L (ref 22–32)
Calcium: 8.2 mg/dL — ABNORMAL LOW (ref 8.9–10.3)
Chloride: 106 mmol/L (ref 98–111)
Creatinine, Ser: 2.11 mg/dL — ABNORMAL HIGH (ref 0.44–1.00)
GFR calc Af Amer: 24 mL/min — ABNORMAL LOW (ref >60)
GFR calc non Af Amer: 20 mL/min — ABNORMAL LOW (ref >60)
GLUCOSE: 101 mg/dL — AB (ref 70–99)
Potassium: 3.7 mmol/L (ref 3.5–5.1)
Sodium: 144 mmol/L (ref 135–145)
Total Bilirubin: 0.9 mg/dL (ref 0.3–1.2)
Total Protein: 5.6 g/dL — ABNORMAL LOW (ref 6.5–8.1)

## 2018-01-03 LAB — CULTURE, RESPIRATORY W GRAM STAIN

## 2018-01-03 LAB — POCT I-STAT 3, ART BLOOD GAS (G3+)
Acid-Base Excess: 7 mmol/L — ABNORMAL HIGH (ref 0.0–2.0)
Bicarbonate: 31.5 mmol/L — ABNORMAL HIGH (ref 20.0–28.0)
O2 Saturation: 99 %
Patient temperature: 98.6
TCO2: 33 mmol/L — ABNORMAL HIGH (ref 22–32)
pCO2 arterial: 42.2 mmHg (ref 32.0–48.0)
pH, Arterial: 7.482 — ABNORMAL HIGH (ref 7.350–7.450)
pO2, Arterial: 151 mmHg — ABNORMAL HIGH (ref 83.0–108.0)

## 2018-01-03 LAB — GLUCOSE, CAPILLARY
Glucose-Capillary: 103 mg/dL — ABNORMAL HIGH (ref 70–99)
Glucose-Capillary: 113 mg/dL — ABNORMAL HIGH (ref 70–99)
Glucose-Capillary: 162 mg/dL — ABNORMAL HIGH (ref 70–99)
Glucose-Capillary: 59 mg/dL — ABNORMAL LOW (ref 70–99)
Glucose-Capillary: 71 mg/dL (ref 70–99)
Glucose-Capillary: 72 mg/dL (ref 70–99)
Glucose-Capillary: 89 mg/dL (ref 70–99)
Glucose-Capillary: 94 mg/dL (ref 70–99)

## 2018-01-03 LAB — CALCIUM, IONIZED: Calcium, Ionized, Serum: 5.2 mg/dL (ref 4.5–5.6)

## 2018-01-03 LAB — PHOSPHORUS
Phosphorus: 3.6 mg/dL (ref 2.5–4.6)
Phosphorus: 3.6 mg/dL (ref 2.5–4.6)

## 2018-01-03 LAB — VITAMIN B12: Vitamin B-12: 461 pg/mL (ref 180–914)

## 2018-01-03 LAB — MAGNESIUM
Magnesium: 2 mg/dL (ref 1.7–2.4)
Magnesium: 2 mg/dL (ref 1.7–2.4)

## 2018-01-03 LAB — CULTURE, RESPIRATORY

## 2018-01-03 LAB — PROCALCITONIN: Procalcitonin: 1.09 ng/mL

## 2018-01-03 LAB — TROPONIN I: Troponin I: 0.66 ng/mL (ref <0.03)

## 2018-01-03 MED ORDER — METOPROLOL TARTRATE 50 MG PO TABS
50.0000 mg | ORAL_TABLET | Freq: Two times a day (BID) | ORAL | Status: DC
  Administered 2018-01-03 – 2018-01-04 (×4): 50 mg via ORAL
  Filled 2018-01-03 (×3): qty 1

## 2018-01-03 MED ORDER — FUROSEMIDE 40 MG PO TABS
40.0000 mg | ORAL_TABLET | Freq: Every day | ORAL | Status: DC
  Administered 2018-01-04: 40 mg
  Filled 2018-01-03: qty 1

## 2018-01-03 MED ORDER — LORAZEPAM 2 MG/ML IJ SOLN: 2.0000 mg | INTRAMUSCULAR | Status: DC | PRN

## 2018-01-03 MED ORDER — SODIUM CHLORIDE 0.9 % IV SOLN
1.0000 g | Freq: Two times a day (BID) | INTRAVENOUS | Status: AC
  Administered 2018-01-03 – 2018-01-09 (×13): 1 g via INTRAVENOUS
  Filled 2018-01-03 (×14): qty 1

## 2018-01-03 MED ORDER — DEXTROSE 50 % IV SOLN
25.0000 mL | INTRAVENOUS | Status: AC
  Administered 2018-01-03: 25 mL via INTRAVENOUS
  Filled 2018-01-03: qty 50

## 2018-01-03 MED ORDER — DONEPEZIL HCL 5 MG PO TABS
5.0000 mg | ORAL_TABLET | Freq: Every day | ORAL | Status: DC
  Administered 2018-01-03 – 2018-01-14 (×12): 5 mg via ORAL
  Filled 2018-01-03 (×12): qty 1

## 2018-01-03 MED ORDER — ISOSORBIDE MONONITRATE ER 30 MG PO TB24: 30.0000 mg | ORAL_TABLET | Freq: Every day | ORAL | Status: DC

## 2018-01-03 MED ORDER — NITROGLYCERIN 0.3 MG/HR TD PT24
0.3000 mg | MEDICATED_PATCH | Freq: Every day | TRANSDERMAL | Status: DC
  Administered 2018-01-03 – 2018-01-14 (×12): 0.3 mg via TRANSDERMAL
  Filled 2018-01-03 (×12): qty 1

## 2018-01-03 NOTE — Progress Notes (Signed)
PT Cancellation Note and Discharge  Patient Details Name: [name] MRN: [id number] DOB: [birth year]   Cancelled Treatment:    Reason Eval/Treat Not Completed: Medical issues which prohibited therapy. Patient remains intubated on full vent support and unable to wean. Not following commands. Discussed POC with RN, Nira Conn; planned MRI today. Will sign off at this time until pt medically appropriate for physical therapy. Please re-consult with status change.  [name], PT, DPT Acute Rehabilitation Services Pager [phone] Office [phone]    [name] 01/03/2018, 12:03 PM

## 2018-01-03 NOTE — Progress Notes (Signed)
NAME:  [name], MRN:  [id number], DOB:  [birth year], LOS: 57 ADMISSION DATE:  12/05/2017, CONSULTATION DATE:  01/01/18 REFERRING MD:  Cruzita Lederer  CHIEF COMPLAINT:  Respiratory Distress   Brief History   [name] is a 84 y.o. female who was initially admitted 11/5 with aphasia.  Had imaging that was negative for CVA.  Has had complicated course with FTT, AoC combined heart failure, AoCKD, acute pulmonary edema, respiratory distress.  On 12/2, had recurrent episode of respiratory distress requiring intubation.  History of present illness   Pt is encephelopathic; therefore, this HPI is obtained from chart review. [name] is a 84 y.o. female who has a PMH as outlined below (see "past medical history").  She presented to [facility] 11/5 with aphasia.  On 11/5, son had noticed that she was slurring words; therefore, he brought her to the hospital for further evaluation.  She had CT and MRI which was negative for CVA.  She was found to have UTI and was started on Rocephin which was later changed to ampicillin and later zosyn.  Hospital course was complicated by failure to thrive.  Palliative care consulted 11/13 for goals of care.  Family opted for full code / full scope of care as feared mother would not receive appropriate treatment otherwise (Son is apparently a Copy).  Son requested neurology consultation on 11/20 for FTT as well as decreasing memory.  Neuro felt that her presentation was suggestive of a possible mixed underlying dementia process with cognitive worsening precipitated by intercurrent illness.  On 11/21, she had an episode of acute respiratory distress.  Was found to have acute pulmonary edema as well as elevated troponin felt to be due to NSTEMI.  She was treated with heparin, statin, beta-blocker, Plavix and Lasix.  An echocardiogram was obtained and demonstrated EF 30% with G2DD.  On 12/2, she had recurrent episode to the point that she was almost agonal.  PCCM was  called in consultation.  I had lengthy discussion with pt's daughter and son (Copy).  Son frustrated that pt not on nitro gtt, feels that pt has deteriorated "because of what we have done to her".  Of note, she apparently was recently diagnosed with achalasia after 50 lb weight loss over the past 1 year.  She had been evaluated by GI and had an appointment as an outpatient on 11/6.  Due to this, she has had trouble eating and maintaining weight; hence, the 50lb weight loss and FTT.  Past Medical History  HTN, combined heart failure, CAD, cardiomyopathy, CKD, DM II, FTT.  Significant Hospital Events   11/5 > admit. 11/10 > G tube placed by IR. 11/13 > PMT consulted. 11/20 > neuro consult.  Rapid response for respiratory distress, treated with lasix. 11/21 > cardiology consult for NSTEMI. 11/23 > rapid response for respiratory distress, treated with lasix and morphine. 11/27 > ENT consulted for stridor, flexible laryngoscopy negative. 12/1 > rapid response for respiratory distress, treated with morphine. 12/2 > PCCM consulted for agonal respirations, transferred to ICU for intubation. 12/3/>> Stable right atelectasis and effusion  Consults:  Radiology. Palliative Care. Surgery. Neurology. Cardiology. ENT. PCCM. Nephrology.  Procedures:  G tube 11/10 > ETT 12/2 >   Significant Diagnostic Tests:  CT head 11/5 > chronic ischemic disease. MRI brain 11/5 > no acute process. Abd [place] 11/19 > mod size periumbilical hernia with solid density within.   Echo 11/21 > EF 30%, G2DD. Flex laryngoscopy 11/27 > negative.  Micro  Data:  Urine 11/5 > enterococcus faecalis. Blood 11/22 > negative. Urine 11/23 > enterococcus faecalis. Blood 12/2 > Sputum 12/2 > Moderate gram negative rods  Antimicrobials:  Ceftriaxone 11/5 > 11/6. Ampicillin 11/6 > 11/8. Zosyn 11/24 > 11/29.  12/2 >   Interim history/subjective:  Off sedation this morning, tolerating SBT. Agitated. Tolerating  diuresis.  Goes apneic on PSV.  Objective:  Blood pressure (!) 122/56, pulse 73, temperature 98.4 F (36.9 C), temperature source Oral, resp. rate 14, height 5\' 3"  (1.6 m), weight 59 kg, SpO2 100 %.    Vent Mode: PRVC FiO2 (%):  [40 %-50 %] 40 % Set Rate:  [16 bmp] 16 bmp Vt Set:  [410 mL] 410 mL PEEP:  [5 cmH20] 5 cmH20 Pressure Support:  [10 cmH20] 10 cmH20 Plateau Pressure:  [15 cmH20-19 cmH20] 15 cmH20   Intake/Output Summary (Last 24 hours) at 01/03/2018 0927 Last data filed at 01/03/2018 0800 Gross per 24 hour  Intake 1122.54 ml  Output 2050 ml  Net -927.46 ml   Filed Weights   12/28/17 0452 12/29/17 0300 12/31/17 0600  Weight: 58.1 kg 58 kg 59 kg    Examination: General: Adult female, chronically ill appearing, off sedation , does not follow commands Neuro: Arouses to stimulation, non-purposeful, MAE x 4 HEENT: Donovan/AT. Sclerae anicteric, no LAD, ETT, OG .  Cardiovascular: S1, S2, RRR, no M/R/G.  Lungs: Bilateral excursion, Respirations unlabored on full vent support.Clear throughout. Abdomen: PEG in place.  BS x 4, soft, NT/ND.   Musculoskeletal: No obvious deformities, no edema.  Bilateral ankles in pressure relief boots. Skin: Sacral pressure sores per RN.  Otherwise skin is warm, no rashes, no lesions.  Assessment & Plan:   Acute hypoxic respiratory failure - presumed due to acute pulmonary edema in the setting of AoC / decompensated combined heart failure.  Critically ill due to requirement for titration of mechanical ventilation. Probable aspiration - copious thick dried secretions noted in oropharynx and around vocal cords during intubation. Leukocytosis Ao / decompensated combined heart failure - echo from 11/21 with EF 30% and G2DD. NSTEMI - s/p heparin gtt. AoCKD - had worsening after diuresis following previous episode of respiratory distress. creatinine likely overestimates her GFR due to age and sarcopenia  DM II. Hypoglycemia Dysphagia. AoC  anemia. Acute encephalopathy - unclear etiology. FTT - per imaging and neuro, picture suggestive of underlying vascular dementia.  Palliative care had been consulted and   PLAN:  Unable to tolerated weaning due to hypoventilation - ABG normal MRI to rule out new brainstem stroke. Complete course of 7 days of meropenem. Step down to oral maintenance diuretics as appears now euvolemic. Restart metoprolol. Trial of aricept for cognitive decline.  Best Practice:  Diet: TF's via PEG Pain/Anxiety/Delirium protocol (if indicated): Intermittent fentanyl/versed. VAP protocol (if indicated): In place. DVT prophylaxis: SCD's. GI prophylaxis: Pantoprazole. Glucose control: SSI. Mobility: Bedrest. Code Status: Full. Family Communication:  pt's daughter at bedside and son over the phone on 12/2.  No family at bedside 12/3. Of note :Son is a Copy and questions why pt is not on a nitroglycerin infusion.  He also states that "[facility] are the ones who got her in this state from the 27L positive fluid balance". Disposition: ICU.  Labs   CBC: Recent Labs  Lab 12/30/17 0627 12/31/17 0645 01/01/18 0434 01/02/18 0340 01/03/18 0252  WBC 9.7 10.2 16.1* 15.1* 16.0*  HGB 8.6* 8.2* 9.6* 8.4* 8.2*  HCT 29.0* 27.9* 32.7* 28.4* 28.1*  MCV  95.4 96.5 95.6 94.4 96.6  PLT 251 278 355 288 528   Basic Metabolic Panel: Recent Labs  Lab 12/30/17 0627 12/31/17 0645 01/01/18 0434 01/02/18 0340 01/02/18 1834 01/03/18 0252  NA 143 144 142 144  --  144  K 3.9 3.8 4.0 2.9*  --  3.7  CL 100 102 100 105  --  106  CO2 32 31 29 29   --  28  GLUCOSE 98 117* 173* 132*  --  101*  BUN 56* 51* 51* 47*  --  44*  CREATININE 2.21* 2.10* 2.10* 2.30*  --  2.11*  CALCIUM 8.1* 7.9* 8.4* 8.3*  --  8.2*  MG  --   --   --  2.0 2.0 2.0  PHOS  --   --   --  3.5 4.0 3.6   GFR: Estimated Creatinine Clearance: 15.2 mL/min (A) (by C-G formula based on SCr of 2.11 mg/dL (H)). Recent Labs  Lab 12/31/17 0645  12/31/17 2214 01/01/18 0434 01/01/18 0802 01/01/18 1851 01/02/18 0340 01/03/18 0252  PROCALCITON  --   --   --   --  1.50 1.34 1.09  WBC 10.2  --  16.1*  --   --  15.1* 16.0*  LATICACIDVEN  --  1.9 1.3 1.0  --   --   --    Liver Function Tests: Recent Labs  Lab 01/01/18 0434 01/03/18 0252  AST 18 17  ALT 17 12  ALKPHOS 47 48  BILITOT 0.9 0.9  PROT 6.3* 5.6*  ALBUMIN 1.7* 1.5*   No results for input(s): LIPASE, AMYLASE in the last 168 hours. No results for input(s): AMMONIA in the last 168 hours. ABG    Component Value Date/Time   PHART 7.482 (H) 01/03/2018 0420   PCO2ART 42.2 01/03/2018 0420   PO2ART 151.0 (H) 01/03/2018 0420   HCO3 31.5 (H) 01/03/2018 0420   TCO2 33 (H) 01/03/2018 0420   ACIDBASEDEF 2.8 (H) 12/20/2017 2235   O2SAT 99.0 01/03/2018 0420    Coagulation Profile: No results for input(s): INR, PROTIME in the last 168 hours. Cardiac Enzymes: Recent Labs  Lab 01/01/18 0434 01/01/18 1851 01/03/18 0252  TROPONINI 1.03* 0.94* 0.66*   CBG: Recent Labs  Lab 01/02/18 1641 01/02/18 2056 01/03/18 0034 01/03/18 0257 01/03/18 0802  GLUCAP 212* 217* 162* 103* 72    Critical Care time: 31 min.  Kipp Brood, MD [facility] ICU Physician [place]  Pager: [phone] Mobile: [phone] After hours: [phone].   01/03/2018, 9:27 AM

## 2018-01-03 NOTE — Progress Notes (Signed)
[facility] Progress Note    Assessment/ Plan:   1.  Acute kidney injury superimposed on CKD 3: I suspect this is a multifactorial combination of possible sepsis, CHF exacerbation, and a clinical manifestation of overall multisystem organ failure.  Continue Lasix 40 IV BID- good diuresis.  Of note, she is an exceptionally poor dialysis candidate due to debilitated functional status and comorbid conditions.  Please note that her creatinine likely overestimates her GFR due to age and sarcopenia so avoidance of nephrotoxic medications will be of utmost importance.    2.  HTN: Suspect that a large part of her hypertensive issues are due to volume, improved when not agitated.  Still with nitropaste.  On amlodipine 10 and Imdur 30 as OP.  Will switch nitropaste to imdur, diurese, and follow.  May need to add back amlodipine.  3.  Acute hypoxic RF: aspiration + vol overload.  Vent management and antibiotics per primary.  Tracheal aspirate with ESBL Klebsiella sensitive to gent, carbapenems, and cipro.  On Zosyn currently.  Blood cultures negative.  Will defer to PCCM for antibiotic choice but gentamicin should be avoided.    4.  FTT/Achalasia: s/p G-tube, TF restarted.  Had lost 50 lbs as OP.  5.  Severe malnutrition: albumin 1.7  6.  Sepsis: cultures taken, abx per primary  As above in #3  7.  NSTEMI: had hep gtt, on nitropaste--> switching to Imdur as above in #2  8  Hypokalemia: getting repletion prn  9.  Dispo: ICU, appreciate pall care c/s  Subjective:    More agitated today, attempting to pull out ETT through mitts.  Robust diuresis with Lasix.     Objective:   BP (!) 122/56   Pulse 73   Temp 98.4 F (36.9 C) (Oral)   Resp 14   Ht 5' 3"  (1.6 m)   Wt 59 kg   SpO2 100%   BMI 23.03 kg/m   Intake/Output Summary (Last 24 hours) at 01/03/2018 9417 Last data filed at 01/03/2018 0700 Gross per 24 hour  Intake 1121.57 ml  Output 2050 ml  Net -928.43 ml    Weight change:   Physical Exam: GEN frail, elderly woman, intubated, intermittently agitated HEENT eyes open NECK JVD improved PULM diffuse crackles improved, still with some though CV tachycardic ABD + G-tube, nontender EXT sarcopenic with 3rd spacing, improved from yesterday NEURO doesn't respond to commands  Imaging: Portable Chest Xray  Result Date: 01/02/2018 CLINICAL DATA:  Respiratory failure EXAM: PORTABLE CHEST 1 VIEW COMPARISON:  01/01/2018 FINDINGS: Cardiac shadow is mildly enlarged but stable. Postsurgical changes are again seen. Endotracheal tube, nasogastric catheter and PICC line are again seen. The nasogastric catheter proximal side port lies in the distal esophagus. This could be advanced several cm as necessary. Right-sided pleural effusion and atelectatic changes are noted stable from the previous day. No other new focal abnormality is seen. IMPRESSION: Stable right basilar atelectasis and effusion. Electronically Signed   By: Inez Catalina M.D.   On: 01/02/2018 08:50   Dg Chest Port 1 View  Result Date: 01/01/2018 CLINICAL DATA:  Respiratory failure. Follow-up exam. Intubated patient. EXAM: PORTABLE CHEST 1 VIEW COMPARISON:  12/31/2017 and older exams. FINDINGS: New endotracheal tube has its tip projecting 13 mm above the Carina. New nasal/orogastric tube passes below the diaphragm well into the stomach. Interstitial thickening has improved compared to the previous day's study. There is persistent hazy opacity at both lung bases obscuring the hemidiaphragms consistent with a small pleural  effusions. No new lung abnormalities. No pneumothorax. Right PICC is stable. IMPRESSION: 1. Endotracheal tube and nasal/orogastric tube are well positioned. 2. Improved lung aeration with decreased bilateral interstitial edema. Electronically Signed   By: Lajean Manes M.D.   On: 01/01/2018 12:59    Labs: BMET Recent Labs  Lab 12/28/17 0502 12/29/17 0406 12/30/17 0037  12/31/17 0645 01/01/18 0434 01/02/18 0340 01/02/18 1834 01/03/18 0252  NA 139 139 143 144 142 144  --  144  K 5.1 4.1 3.9 3.8 4.0 2.9*  --  3.7  CL 99 101 100 102 100 105  --  106  CO2 30 32 32 31 29 29   --  28  GLUCOSE 185* 127* 98 117* 173* 132*  --  101*  BUN 68* 65* 56* 51* 51* 47*  --  44*  CREATININE 2.67* 2.41* 2.21* 2.10* 2.10* 2.30*  --  2.11*  CALCIUM 8.8* 8.1* 8.1* 7.9* 8.4* 8.3*  --  8.2*  PHOS  --   --   --   --   --  3.5 4.0 3.6   CBC Recent Labs  Lab 12/31/17 0645 01/01/18 0434 01/02/18 0340 01/03/18 0252  WBC 10.2 16.1* 15.1* 16.0*  HGB 8.2* 9.6* 8.4* 8.2*  HCT 27.9* 32.7* 28.4* 28.1*  MCV 96.5 95.6 94.4 96.6  PLT 278 355 288 277    Medications:    . sodium chloride   Intravenous Once  . alteplase  2 mg Intracatheter Once  . atorvastatin  40 mg Per Tube q1800  . chlorhexidine gluconate (MEDLINE KIT)  15 mL Mouth Rinse BID  . cholecalciferol  400 Units Per Tube Daily  . clopidogrel  75 mg Per Tube Daily  . feeding supplement (VITAL HIGH PROTEIN)  1,000 mL Per Tube Q24H  . ferrous sulfate  300 mg Per Tube Daily  . furosemide  40 mg Intravenous BID  . insulin aspart  0-15 Units Subcutaneous Q4H  . insulin detemir  10 Units Subcutaneous BID  . levalbuterol  0.63 mg Nebulization BID   And  . ipratropium  0.5 mg Nebulization BID  . mouth rinse  15 mL Mouth Rinse 10 times per day  . metoCLOPramide (REGLAN) injection  5 mg Intravenous Q6H  . multivitamin  15 mL Per Tube Daily  . nitroGLYCERIN  1 inch Topical Q6H  . pantoprazole (PROTONIX) IV  40 mg Intravenous QHS  . sennosides  5 mL Per Tube QHS      Madelon Lips, MD 01/03/2018, 8:42 AM

## 2018-01-03 NOTE — Progress Notes (Signed)
Pt weaned approx 7-8 minutes then pt developed apnea x 4.  RN at bedside and aware.  Pt placed back on full vent support

## 2018-01-03 NOTE — Progress Notes (Signed)
RT attempted ABG X's 3 and was  not successful used doppler and other measures to help retrieve ABG and none helped.

## 2018-01-03 NOTE — Progress Notes (Signed)
Pt transported to/from MRI via vent w/ no apparent complications.

## 2018-01-03 NOTE — Progress Notes (Signed)
[name], female   DOB: [birth year], 84 y.o.   MRN: [id number]  This NP visited patient at the bedside as a follow up for palliative medicine needs. No family at bedside   This is day  25 of hospital stay, patient has had continued physical and functional decline and today remains intubated.  Long term poor prognosis.  I shared with Dr. Lynetta Mare my previous conversations with the patient's son/Dr. Mayer Masker.  He was adamant about full aggressive care, treat the treatable, open to all interventions to prolong life.  Dr Lynetta Mare understands the family's plan of care.   At this time plan is to continue to treat the treatable and hope is to wean patient from ventilator.  Family will face ongoing decisions related to treatment options, advance directive decisions and anticipatory care needs.  It will be necessary to have ongoing goals of care discussions with family depending on outcomes during this hospitalization..  Total time spent on the unit was 15 minutes  Greater than 50% of the time was spent in counseling and coordination of care  Wadie Lessen NP  Palliative Medicine Team Team Phone # [phone] Pager [phone]

## 2018-01-04 ENCOUNTER — Inpatient Hospital Stay (HOSPITAL_COMMUNITY): Payer: Medicare Other

## 2018-01-04 DIAGNOSIS — J9621 Acute and chronic respiratory failure with hypoxia: Secondary | ICD-10-CM

## 2018-01-04 DIAGNOSIS — I639 Cerebral infarction, unspecified: Secondary | ICD-10-CM

## 2018-01-04 LAB — CBC WITH DIFFERENTIAL/PLATELET
Abs Immature Granulocytes: 0.1 10*3/uL — ABNORMAL HIGH (ref 0.00–0.07)
BASOS PCT: 0 %
Basophils Absolute: 0 10*3/uL (ref 0.0–0.1)
Eosinophils Absolute: 0.1 10*3/uL (ref 0.0–0.5)
Eosinophils Relative: 1 %
HCT: 27.5 % — ABNORMAL LOW (ref 36.0–46.0)
Hemoglobin: 8.1 g/dL — ABNORMAL LOW (ref 12.0–15.0)
Immature Granulocytes: 1 %
Lymphocytes Relative: 4 %
Lymphs Abs: 0.8 10*3/uL (ref 0.7–4.0)
MCH: 28.4 pg (ref 26.0–34.0)
MCHC: 29.5 g/dL — ABNORMAL LOW (ref 30.0–36.0)
MCV: 96.5 fL (ref 80.0–100.0)
Monocytes Absolute: 1 10*3/uL (ref 0.1–1.0)
Monocytes Relative: 6 %
NEUTROS PCT: 88 %
NRBC: 0 % (ref 0.0–0.2)
Neutro Abs: 16.2 10*3/uL — ABNORMAL HIGH (ref 1.7–7.7)
Platelets: 264 10*3/uL (ref 150–400)
RBC: 2.85 MIL/uL — ABNORMAL LOW (ref 3.87–5.11)
RDW: 16.4 % — ABNORMAL HIGH (ref 11.5–15.5)
WBC: 18.3 10*3/uL — ABNORMAL HIGH (ref 4.0–10.5)

## 2018-01-04 LAB — BASIC METABOLIC PANEL
ANION GAP: 11 (ref 5–15)
BUN: 41 mg/dL — ABNORMAL HIGH (ref 8–23)
CO2: 26 mmol/L (ref 22–32)
Calcium: 8.2 mg/dL — ABNORMAL LOW (ref 8.9–10.3)
Chloride: 106 mmol/L (ref 98–111)
Creatinine, Ser: 1.99 mg/dL — ABNORMAL HIGH (ref 0.44–1.00)
GFR calc Af Amer: 25 mL/min — ABNORMAL LOW (ref >60)
GFR calc non Af Amer: 22 mL/min — ABNORMAL LOW (ref >60)
GLUCOSE: 164 mg/dL — AB (ref 70–99)
Potassium: 3.1 mmol/L — ABNORMAL LOW (ref 3.5–5.1)
Sodium: 143 mmol/L (ref 135–145)

## 2018-01-04 LAB — CALCIUM, IONIZED: Calcium, Ionized, Serum: 5.1 mg/dL (ref 4.5–5.6)

## 2018-01-04 LAB — PHOSPHORUS: Phosphorus: 3.5 mg/dL (ref 2.5–4.6)

## 2018-01-04 LAB — GLUCOSE, CAPILLARY
Glucose-Capillary: 136 mg/dL — ABNORMAL HIGH (ref 70–99)
Glucose-Capillary: 141 mg/dL — ABNORMAL HIGH (ref 70–99)
Glucose-Capillary: 145 mg/dL — ABNORMAL HIGH (ref 70–99)
Glucose-Capillary: 119 mg/dL — ABNORMAL HIGH (ref 70–99)
Glucose-Capillary: 86 mg/dL (ref 70–99)
Glucose-Capillary: 112 mg/dL — ABNORMAL HIGH (ref 70–99)

## 2018-01-04 LAB — MAGNESIUM: Magnesium: 1.9 mg/dL (ref 1.7–2.4)

## 2018-01-04 MED ORDER — FENTANYL CITRATE (PF) 100 MCG/2ML IJ SOLN
25.0000 ug | INTRAMUSCULAR | Status: DC | PRN
  Administered 2018-01-05 – 2018-01-11 (×2): 25 ug via INTRAVENOUS
  Filled 2018-01-04 (×2): qty 2

## 2018-01-04 MED ORDER — VITAL HIGH PROTEIN PO LIQD
1000.0000 mL | ORAL | Status: DC
  Administered 2018-01-05 – 2018-01-07 (×3): 1000 mL

## 2018-01-04 MED ORDER — POTASSIUM CHLORIDE 20 MEQ/15ML (10%) PO SOLN
20.0000 meq | Freq: Once | ORAL | Status: AC
  Administered 2018-01-04: 20 meq via ORAL
  Filled 2018-01-04: qty 15

## 2018-01-04 MED ORDER — ACETAMINOPHEN 160 MG/5ML PO SOLN
500.0000 mg | Freq: Four times a day (QID) | ORAL | Status: DC | PRN
  Administered 2018-01-04 – 2018-02-26 (×37): 500 mg
  Filled 2018-01-04 (×40): qty 20.3

## 2018-01-04 MED ORDER — METOPROLOL TARTRATE 50 MG PO TABS
50.0000 mg | ORAL_TABLET | Freq: Two times a day (BID) | ORAL | Status: DC
  Administered 2018-01-05 – 2018-01-16 (×24): 50 mg
  Filled 2018-01-04 (×26): qty 1

## 2018-01-04 NOTE — Progress Notes (Signed)
RN called d/t pt w/ low minute volume, and noted low RR.  Pt placed back on full vent support.

## 2018-01-04 NOTE — Progress Notes (Signed)
Spoke with patients son and daughter to ask for their permission to transfer the patient to our Neuro ICU. They both said it was okay.

## 2018-01-04 NOTE — Progress Notes (Signed)
Patient transferred to 4N-30. All personal belongings taken. Patient escorted by RT and 2 SWOT nurses. Report given to receiving nurse.

## 2018-01-04 NOTE — Progress Notes (Signed)
Nutrition Follow-up  DOCUMENTATION CODES:   Non-severe (moderate) malnutrition in context of chronic illness, Underweight  INTERVENTION:   Tube Feeding:  Increase Vital High Protein @ 45 ml/hr Provides 1080 kcals, 95 g of protein and 907 mL of free water   NUTRITION DIAGNOSIS:   Moderate Malnutrition related to chronic illness(dysphagia 2/2 to achalasia) as evidenced by energy intake < or equal to 75% for > or equal to 1 month, mild fat depletion, moderate fat depletion, mild muscle depletion, moderate muscle depletion, energy intake < or equal to 50% for > or equal to 1 month.  Being addressed via TF   GOAL:   Patient will meet greater than or equal to 90% of their needs  Progressing  MONITOR:   PO intake, Supplement acceptance, Labs, Weight trends, Skin, I & O's  REASON FOR ASSESSMENT:   Consult Assessment of nutrition requirement/status, Poor PO  ASSESSMENT:   [name] is a 84 y.o. female with medical history significant for DM2, HTN, HLD, CAD s/p CABG, h/o CVA, CKD3, who was recently diagnosed with achalasia after a 50 lb weight loss over the past year. She was treated with botox earlier in October and sent to SNF with the hopes she would be able to swallow her food. She has not done well since that time, has only been able to drink small amounts of milk. Son is a Management consultant in [place] and has been visiting. Yesterday he noticed she was stuttering, having difficulty getting words out, and progressed until she seemed to have total aphasia last night. She does report having dysuria and some lower abd pain. No N/V. Has a good appetite but avoids food because of the obstruction. Megace and remeron have not helped. Process of being worked up for G tube has been initiated and pt reportedly has appt with [facility] tomorrow morning. Son feels like her weight loss and FTT could be reversible if she gets a G tube as she was reportedly very functional a month ago, walking  with a walker and performing all of her ADLs.   11/5 MRI brain with no acute process, CT head with chronic ischemic disease 12/4 MRI Brain with 3 subcentimeter scattered embolic looking strokes  Patient remains intubated on ventilator support, being transferred to 4N ICU Agitated this AM, placed in soft restraints MV: 6.6 L/min Temp (24hrs), Avg:98.7 F (37.1 C), Min:97.8 F (36.6 C), Max:99.8 F (37.7 C)  Tolerating Vital High Protein @ 20 ml/hr, no vomiting noted, plan to increase rate today  Labs: Creatinine 1.99, BUN 41, potassium 3.1 (L) Meds:  Ferrous sulfate, lasix, ss novolog, cholecalciferol   Diet Order:   Diet Order            Diet NPO time specified  Diet effective now              EDUCATION NEEDS:   Education needs have been addressed  Skin:  Skin Assessment: Skin Integrity Issues: Skin Integrity Issues:: Stage II Stage II: left buttocks, sacrum Other: partial thickness wounds to bilateral buttocks (likely from adhesive)  Last BM:  12/5  Height:   Ht Readings from Last 1 Encounters:  12/20/17 5\' 3"  (1.6 m)    Weight:   Wt Readings from Last 1 Encounters:  12/31/17 59 kg    Ideal Body Weight:  52.3 kg  BMI:  Body mass index is 23.03 kg/m.  Estimated Nutritional Needs:   Kcal:  1100 kcals   Protein:  75-100 g  Fluid:  >/=  1.2 L   Kerman Passey MS, RD, LDN, CNSC [phone] Pager  [phone] Weekend/On-Call Pager

## 2018-01-04 NOTE — Progress Notes (Signed)
EEG completed, results pending. 

## 2018-01-04 NOTE — Progress Notes (Signed)
[facility] Progress Note    Assessment/ Plan:   1.  Acute kidney injury superimposed on CKD 3: I suspect this is a multifactorial combination of possible sepsis, CHF exacerbation, and a clinical manifestation of overall multisystem organ failure.  Switched to 40 PO Lasix yesterday, still making good urine.  Of note, she is an exceptionally poor dialysis candidate due to debilitated functional status and comorbid conditions.  Please note that her creatinine likely overestimates her GFR due to age and sarcopenia so avoidance of nephrotoxic medications will be of utmost importance.    2.  HTN: Suspect that a large part of her hypertensive issues are due to volume, improved when not agitated.  Switched to nitropatch yesterday.  Improved Bps.  On imdur and amlodpine as OP.  3.  Acute hypoxic RF: aspiration + vol overload.  Vent management and antibiotics per primary.  Tracheal aspirate with ESBL Klebsiella, switched to meropenem from Zosyn yesterday.    4.  FTT/Achalasia: s/p G-tube, TF restarted.  Had lost 50 lbs as OP.  5.  Severe malnutrition: albumin 1.7  6.  Sepsis: cultures taken, abx per primary  As above in #3  7.  NSTEMI: had hep gtt, on nitropaste--> above in #2  8  Hypokalemia: getting repletion prn  9.  Acute L cerebral infarction: noted on MRI yesterday.  Also has severe chronic vascular disease.  Per primary  10.  Dispo: ICU, appreciate pall care c/s, all aggressive measures being pursued.  There is not a whole lot else to offer from renal perspective at this time.  We will sign off, call with questions.  Subjective:    MRI with severe chronic vascular small vessel disease and three new acute infarcts L cerebral hemisphere.  Transitioned to PO diuretics and nitropatch yesterday with PCCM.  Cr stable, vol status slowly improving.   Objective:   BP (!) 137/57   Pulse 92   Temp 99.8 F (37.7 C) (Oral)   Resp 13   Ht 5' 3" (1.6 m)   Wt 59 kg   SpO2  100%   BMI 23.03 kg/m   Intake/Output Summary (Last 24 hours) at 01/04/2018 0803 Last data filed at 01/04/2018 0500 Gross per 24 hour  Intake 1019.46 ml  Output 1300 ml  Net -280.54 ml   Weight change:   Physical Exam: GEN frail, elderly woman, intubated, intermittently agitated HEENT eyes closed NECK JVD improved PULM bilateral rhonchi CV RRR ABD + G-tube, nontender EXT sarcopenic with 3rd spacing, still improving NEURO doesn't respond to commands  Imaging: Mr Brain Wo Contrast  Result Date: 01/03/2018 CLINICAL DATA:  Stroke follow-up. EXAM: MRI HEAD WITHOUT CONTRAST TECHNIQUE: Multiplanar, multiecho pulse sequences of the brain and surrounding structures were obtained without intravenous contrast. COMPARISON:  12/05/2017 FINDINGS: Brain: There are 3 subcentimeter foci of restricted diffusion compatible with acute infarcts involving posterior left temporal lobe cortex, left postcentral gyrus, and cortex or subcortical white matter of the left middle frontal gyrus. A subcentimeter focus of diffusion abnormality in the splenium of the corpus callosum has also been present on prior studies and, while more conspicuous today, is favored to be artifactual rather than reflecting an acute infarct. No intracranial hemorrhage, mass, midline shift, or extra-axial fluid collection is identified. There is moderate cerebral atrophy. Extensive T2 hyperintensities throughout the cerebral white matter and brainstem are unchanged and nonspecific but compatible with severe chronic small vessel ischemic disease. Chronic lacunar infarcts are again noted in the basal ganglia and thalami. There  is also a small chronic cortical infarct in the right precentral gyrus. Vascular: Major intracranial vascular flow voids are preserved. Skull and upper cervical spine: Diffusely heterogeneous bone marrow signal without suspicious focal lesion identified. Sinuses/Orbits: Bilateral cataract extraction. Clear paranasal  sinuses. Trace left mastoid effusion. Other: None. IMPRESSION: 1. Three subcentimeter acute infarcts in the left cerebral hemisphere. 2. Severe chronic small vessel ischemic disease. Electronically Signed   By: Logan Bores M.D.   On: 01/03/2018 15:07   Dg Chest Port 1 View  Result Date: 01/03/2018 CLINICAL DATA:  Respiratory failure, intubated EXAM: PORTABLE CHEST 1 VIEW COMPARISON:  Chest radiograph from one day prior. FINDINGS: Endotracheal tube tip is 3.4 cm above the carina. Enteric tube enters stomach with the tip not seen on this image. Right PICC terminates at the cavoatrial junction. Intact sternotomy wires. Stable cardiomediastinal silhouette with mild cardiomegaly. No pneumothorax. Stable small bilateral pleural effusions. No overt pulmonary edema. Patchy bibasilar lung opacities are slightly increased on the left and stable on the right. IMPRESSION: 1. Well-positioned support structures. 2. Stable small bilateral pleural effusions. 3. Patchy bibasilar opacities increased on the left, favor atelectasis. 4. Stable mild cardiomegaly without overt pulmonary edema. Electronically Signed   By: Ilona Sorrel M.D.   On: 01/03/2018 09:28    Labs: BMET Recent Labs  Lab 12/29/17 0406 12/30/17 0627 12/31/17 0645 01/01/18 0434 01/02/18 0340 01/02/18 1834 01/03/18 0252 01/03/18 1920 01/04/18 0602  NA 139 143 144 142 144  --  144  --  143  K 4.1 3.9 3.8 4.0 2.9*  --  3.7  --  3.1*  CL 101 100 102 100 105  --  106  --  106  CO2 32 32 _0 --  28  --  26  GLUCOSE 127* 98 117* 173* 132*  --  101*  --  164*  BUN 65* 56* 51* 51* 47*  --  44*  --  41*  CREATININE 2.41* 2.21* 2.10* 2.10* 2.30*  --  2.11*  --  1.99*  CALCIUM 8.1* 8.1* 7.9* 8.4* 8.3*  --  8.2*  --  8.2*  PHOS  --   --   --   --  3.5 4.0 3.6 3.6 3.5   CBC Recent Labs  Lab 01/01/18 0434 01/02/18 0340 01/03/18 0252 01/04/18 0602  WBC 16.1* 15.1* 16.0* 18.3*  NEUTROABS  --   --   --  16.2*  HGB 9.6* 8.4* 8.2* 8.1*  HCT  32.7* 28.4* 28.1* 27.5*  MCV 95.6 94.4 96.6 96.5  PLT 355 288 277 264    Medications:    . sodium chloride   Intravenous Once  . atorvastatin  40 mg Per Tube q1800  . chlorhexidine gluconate (MEDLINE KIT)  15 mL Mouth Rinse BID  . cholecalciferol  400 Units Per Tube Daily  . clopidogrel  75 mg Per Tube Daily  . donepezil  5 mg Oral QHS  . feeding supplement (VITAL HIGH PROTEIN)  1,000 mL Per Tube Q24H  . ferrous sulfate  300 mg Per Tube Daily  . furosemide  40 mg Per Tube Daily  . insulin aspart  0-15 Units Subcutaneous Q4H  . insulin detemir  10 Units Subcutaneous BID  . levalbuterol  0.63 mg Nebulization BID   And  . ipratropium  0.5 mg Nebulization BID  . mouth rinse  15 mL Mouth Rinse 10 times per day  . metoCLOPramide (REGLAN) injection  5 mg Intravenous Q6H  . metoprolol tartrate  50 mg Oral BID  . multivitamin  15 mL Per Tube Daily  . nitroGLYCERIN  0.3 mg Transdermal Daily  . pantoprazole (PROTONIX) IV  40 mg Intravenous QHS  . sennosides  5 mL Per Tube QHS      Madelon Lips, MD 01/04/2018, 8:03 AM

## 2018-01-04 NOTE — Progress Notes (Signed)
OT Cancellation Note  Patient Details Name: [name] MRN: [id number] DOB: May 10, 1927   Cancelled Treatment:    Reason Eval/Treat Not Completed: Medical issues which prohibited therapy.  Pt continues to be intubated, and has been unable to participate.  Will sign off at this time.  Please reorder if she  Becomes medically stable and able to participate.  Lucille Passy, OTR/L Acute Rehabilitation Services Pager [phone] Office [phone]   Lucille Passy M 01/04/2018, 5:32 AM

## 2018-01-04 NOTE — Progress Notes (Addendum)
Neurology Progress Note   S:// Neurology service recalled because patient's brain MRI showed 3 subcentimeter scattered embolic-looking strokes.  Briefly summarizing Dr. Yvetta Coder HPI, 84 year old woman with past history of hypertension, type 2 diabetes, stroke in 2018 with left-sided weakness-unclear what residual, shortness of breath, hypertension, chronic kidney disease, cardiomyopathy and CAD admitted to the hospital 1 month ago for failure to thrive after she was diagnosed with achalasia and had a 50 pound weight loss over the past year.  She was treated with Botox for achalasia in October sent to a skilled nursing facility with hopes that her swallowing would improve but she has not done well since then.  She had difficulty getting her words out and stuttering for which an initial neurological consultation was placed.  Her symptoms have progressed to the point where the patient did not want to have any conversation and appeared confused and altered.  At that time she was found to have a UTI, dehydration and malnutrition and it was recommended from a neurological standpoint to address those issues to improve her mentation.  She had no focal deficits on exam but had a more encephalopathic exam.  CT of the head with chronic mild ischemic white matter disease and MRI of the brain at the time with chronic lacunar infarcts in the brainstem, bilateral deep gray nuclei and chronic confluent bilateral cerebral white matter T2 and flair hyperintensities along with cortical encephalomalacia at the motor strip, chronic microhemorrhages in the lateral right thalamus and diffuse moderate cerebral atrophy including bilateral moderate to severe hippocampal atrophy.  Of note, from neurology notes of March 2019, she is a modified Rankin of 3 at baseline.  O:// Current vital signs: BP (!) 140/59   Pulse 85   Temp 99.3 F (37.4 C) (Oral)   Resp 13   Ht 5' 3" (1.6 m)   Wt 59 kg   SpO2 100%   BMI 23.03 kg/m   Vital signs in last 24 hours: Temp:  [97.8 F (36.6 C)-99.8 F (37.7 C)] 99.3 F (37.4 C) (12/05 1225) Pulse Rate:  [69-95] 85 (12/05 1043) Resp:  [13-17] 13 (12/05 0800) BP: (121-167)/(57-84) 140/59 (12/05 1043) SpO2:  [100 %] 100 % (12/05 1015) FiO2 (%):  [40 %] 40 % (12/05 1015) General: Intubated, no continuous sedation, received some as needed Versed this morning. HEENT: Endotracheal tube in place.  Normocephalic, atraumatic. Lungs: Bilateral rhonchi GI: Positive bowel sounds, PEG tube Neurological exam Patient is not on any continuous sedation. She is intubated. She is not spontaneously moving any extremities. She does not open her eyes to voice. Upon attempting to open her eyes, she tries to resist some eye opening. She is not following any commands. She has a possible gaze preference to the right. Pupils are equal round reactive. Facial symmetry difficult to ascertain due to endotracheal tube. Upon noxious stimulation with sternal rub, localizes briskly with the right hand.  Not much movement noted in the left arm. Upon noxious stimulation to the inner thigh in the lower extremities, withdraws and briskly localizes with a right upper extremity. Sensory exam as above  NIHSS 1a Level of Conscious.: 2 1b LOC Questions: 2 1c LOC Commands: 2 2 Best Gaze: 1 3 Visual: 0 4 Facial Palsy: 0 5a Motor Arm - left: 3 5b Motor Arm - Right: 2 6a Motor Leg - Left: 2 6b Motor Leg - Right: 2 7 Limb Ataxia: 0 8 Sensory: 0 9 Best Language: 3 10 Dysarthria: un 11 Extinct. and Inatten.: 0 TOTAL:  19  Medications  Current Facility-Administered Medications:  .  0.9 %  sodium chloride infusion (Manually program via Guardrails IV Fluids), , Intravenous, Once, Rai, Ripudeep K, MD .  acetaminophen (TYLENOL) solution 500 mg, 500 mg, Per Tube, Q6H PRN, Agarwala, Ravi, MD .  atorvastatin (LIPITOR) tablet 40 mg, 40 mg, Per Tube, q1800, Schorr, Rhetta Mura, NP, 40 mg at 01/03/18 1752 .   chlorhexidine gluconate (MEDLINE KIT) (PERIDEX) 0.12 % solution 15 mL, 15 mL, Mouth Rinse, BID, Desai, Rahul P, PA-C, 15 mL at 01/04/18 0804 .  cholecalciferol (VITAMIN D3) tablet 400 Units, 400 Units, Per Tube, Daily, Schorr, Rhetta Mura, NP, 400 Units at 01/04/18 1043 .  clopidogrel (PLAVIX) tablet 75 mg, 75 mg, Per Tube, Daily, Rai, Ripudeep K, MD, 75 mg at 01/04/18 1043 .  donepezil (ARICEPT) tablet 5 mg, 5 mg, Oral, QHS, Agarwala, Ravi, MD, 5 mg at 01/03/18 2244 .  feeding supplement (VITAL HIGH PROTEIN) liquid 1,000 mL, 1,000 mL, Per Tube, Q24H, Magdalen Spatz, NP, 1,000 mL at 01/03/18 1752 .  fentaNYL (SUBLIMAZE) injection 25 mcg, 25 mcg, Intravenous, Q1H PRN, Kipp Brood, MD .  fentaNYL (SUBLIMAZE) injection, , Intravenous, PRN, Jacqulynn Cadet, MD, 25 mcg at 12/10/17 0915 .  ferrous sulfate 300 (60 Fe) MG/5ML syrup 300 mg, 300 mg, Per Tube, Daily, Schorr, Rhetta Mura, NP, 300 mg at 01/04/18 1043 .  furosemide (LASIX) tablet 40 mg, 40 mg, Per Tube, Daily, Agarwala, Ravi, MD, 40 mg at 01/04/18 1043 .  hydrALAZINE (APRESOLINE) injection 5 mg, 5 mg, Intravenous, Q6H PRN, Schorr, Rhetta Mura, NP, 5 mg at 01/01/18 1054 .  insulin aspart (novoLOG) injection 0-15 Units, 0-15 Units, Subcutaneous, Q4H, Rai, Ripudeep K, MD, 2 Units at 01/04/18 0803 .  insulin detemir (LEVEMIR) injection 10 Units, 10 Units, Subcutaneous, BID, Rai, Ripudeep K, MD, 10 Units at 01/04/18 1058 .  levalbuterol (XOPENEX) nebulizer solution 0.63 mg, 0.63 mg, Nebulization, BID, 0.63 mg at 01/04/18 0741 **AND** ipratropium (ATROVENT) nebulizer solution 0.5 mg, 0.5 mg, Nebulization, BID, Deland Pretty, MD, 0.5 mg at 01/04/18 0741 .  levalbuterol (XOPENEX) nebulizer solution 0.63 mg, 0.63 mg, Nebulization, Q2H PRN, Rai, Ripudeep K, MD, 0.63 mg at 12/27/17 0316 .  loperamide HCl (IMODIUM) 1 MG/7.5ML suspension 2 mg, 2 mg, Oral, PRN, Schorr, Rhetta Mura, NP, 2 mg at 12/17/17 2201 .  MEDLINE mouth rinse, 15 mL, Mouth Rinse, 10  times per day, Desai, Rahul P, PA-C, 15 mL at 01/04/18 1055 .  meropenem (MERREM) 1 g in sodium chloride 0.9 % 100 mL IVPB, 1 g, Intravenous, Q12H, Agarwala, Ravi, MD, Last Rate: 200 mL/hr at 01/04/18 1042, 1 g at 01/04/18 1042 .  metoCLOPramide (REGLAN) injection 5 mg, 5 mg, Intravenous, Q6H, Rai, Ripudeep K, MD, 5 mg at 01/04/18 1052 .  metoprolol tartrate (LOPRESSOR) tablet 50 mg, 50 mg, Oral, BID, Agarwala, Ravi, MD, 50 mg at 01/04/18 1043 .  midazolam (VERSED) injection 1 mg, 1 mg, Intravenous, Q15 min PRN, Desai, Rahul P, PA-C, 1 mg at 01/04/18 0103 .  midazolam (VERSED) injection 1 mg, 1 mg, Intravenous, Q2H PRN, Desai, Rahul P, PA-C, 1 mg at 01/04/18 1203 .  multivitamin liquid 15 mL, 15 mL, Per Tube, Daily, Schorr, Rhetta Mura, NP, 15 mL at 01/04/18 1042 .  nitroGLYCERIN (NITRODUR - Dosed in mg/24 hr) patch 0.3 mg, 0.3 mg, Transdermal, Daily, Agarwala, Ravi, MD, 0.3 mg at 01/04/18 1042 .  nitroGLYCERIN (NITROSTAT) SL tablet 0.4 mg, 0.4 mg, Sublingual, Q5 Min x 3 PRN, Schorr, Belenda Cruise  P, NP .  ondansetron (ZOFRAN) injection 4 mg, 4 mg, Intravenous, Q8H PRN, Schorr, Rhetta Mura, NP, 4 mg at 12/16/17 1715 .  pantoprazole (PROTONIX) injection 40 mg, 40 mg, Intravenous, QHS, Desai, Rahul P, PA-C, 40 mg at 01/03/18 2244 .  potassium chloride 20 MEQ/15ML (10%) solution 20 mEq, 20 mEq, Oral, Once, Minor, Grace Bushy, NP .  sennosides (SENOKOT) 8.8 MG/5ML syrup 5 mL, 5 mL, Per Tube, QHS, Schorr, Rhetta Mura, NP, 5 mL at 01/03/18 2244 .  sodium chloride flush (NS) 0.9 % injection 10-40 mL, 10-40 mL, Intracatheter, PRN, Schorr, Rhetta Mura, NP, 10 mL at 01/03/18 2352 Labs CBC    Component Value Date/Time   WBC 18.3 (H) 01/04/2018 0602   RBC 2.85 (L) 01/04/2018 0602   HGB 8.1 (L) 01/04/2018 0602   HCT 27.5 (L) 01/04/2018 0602   PLT 264 01/04/2018 0602   MCV 96.5 01/04/2018 0602   MCH 28.4 01/04/2018 0602   MCHC 29.5 (L) 01/04/2018 0602   RDW 16.4 (H) 01/04/2018 0602   LYMPHSABS 0.8 01/04/2018  0602   MONOABS 1.0 01/04/2018 0602   EOSABS 0.1 01/04/2018 0602   BASOSABS 0.0 01/04/2018 0602    CMP     Component Value Date/Time   NA 143 01/04/2018 0602   K 3.1 (L) 01/04/2018 0602   CL 106 01/04/2018 0602   CO2 26 01/04/2018 0602   GLUCOSE 164 (H) 01/04/2018 0602   BUN 41 (H) 01/04/2018 0602   CREATININE 1.99 (H) 01/04/2018 0602   CALCIUM 8.2 (L) 01/04/2018 0602   CALCIUM 10.2 11/23/2017 0910   PROT 5.6 (L) 01/03/2018 0252   ALBUMIN 1.5 (L) 01/03/2018 0252   AST 17 01/03/2018 0252   ALT 12 01/03/2018 0252   ALKPHOS 48 01/03/2018 0252   BILITOT 0.9 01/03/2018 0252   GFRNONAA 22 (L) 01/04/2018 0602   GFRAA 25 (L) 01/04/2018 0602  Imaging I have reviewed images in epic and the results pertinent to this consultation are: MRI examination of the brain 01/03/2018 shows 3 subcentimeter infarcts in the left cerebral hemisphere.  These are new from the prior MRI of 12/05/2017.  Assessment:  84 year old woman past history of type 2 diabetes, stroke with left-sided weakness-unclear what residual of that, shortness of breath, hypertension, chronic kidney disease, cardi35mopathy with latest EF 30% in November 2019, coronary artery disease, achalasia with recent weight loss due to inability to eat secondary to her achalasia brought in for altered mental status and has been in here a month. Hospital course has been complicated with an STEMI, flash pulmonary edema, respiratory failure requiring intubation. MRI was done for persistent encephalopathy and revealed 3 subcentimeter infarcts in the left cerebral hemisphere.  The small strokes, it is unclear how clinically significant they are. She is also had aspiration pneumonia with sputum growing Klebsiella. Strokes in the presence of any kind of infection always raise a possibility for the etiology being cardio embolic with endocarditis. Will need further work-up in that regard. That said, Dr. LYvetta Coderevaluation earlier in this admission on  12/20/2017 mention cognitive impairment in the setting of failure to thrive, prior strokes, imaging significant for multiple old infarcts, diffuse cerebral atrophy including hippocampal atrophy all pointed towards mixed vascular/Alzheimer's dementia with acute worsening due to acute illness. Another differential will consider -seizures.  Impression: Acute embolic-looking strokes-unclear significance but in the setting of infection needs work-up Evaluate for underlying endocarditis Evaluate for underlying seizures Nutritional deficiency in the setting on longstanding PO intake difficulties secondary to achalasia  Recommendations: --2D echo was done 12/21/2017 that revealed LVEF of 30%.  Diffuse hypokinesis.  Mildly dilated left atrium raising suspicion for underlying atrial fibrillation although she does not carry a diagnosis of such. Her strokes could very well be coming from her heart secondary to cardiomyopathy but infectious work-up for infective endocarditis is still indicated. --Blood cultures-ordered by primary team --Repeat echo --EEG --Continue Plavix --Continue telemetry --Minimize sedation  Stroke team will follow the patient.  Spoke with Son, Dr. Rickard Rhymes over phone and explained my exam and imaging findings and plan.  -- Amie Portland, MD [facility] Pager: [phone] If 7pm to 7am, please call on call as listed on AMION.   CRITICAL CARE ATTESTATION Performed by: Amie Portland, MD Total critical care time: 40 minutes Critical care time was exclusive of separately billable procedures and treating other patients and/or supervising APPs/Residents/Students Critical care was necessary to treat or prevent imminent or life-threatening deterioration due to acute ischemic stroke, toxic metabolic encephalopathy This patient is critically ill and at significant risk for neurological worsening and/or death and care requires constant monitoring. Critical care was  time spent personally by me on the following activities: development of treatment plan with patient and/or surrogate as well as nursing, discussions with consultants, evaluation of patient's response to treatment, examination of patient, obtaining history from patient or surrogate, ordering and performing treatments and interventions, ordering and review of laboratory studies, ordering and review of radiographic studies, pulse oximetry, re-evaluation of patient's condition, participation in multidisciplinary rounds and medical decision making of high complexity in the care of this patient.

## 2018-01-04 NOTE — Progress Notes (Signed)
Wasted 138 ml of fentanyl drip 250 ml bag. Witnessed by Dorisann Frames RN

## 2018-01-04 NOTE — Progress Notes (Signed)
Pt unavailable for EEG. Pt will be transferring to 4N. Will attempt as schedule permits

## 2018-01-04 NOTE — Progress Notes (Signed)
NAME:  [name], MRN:  [id number], DOB:  04-Nov-1927, LOS: 32 ADMISSION DATE:  12/05/2017, CONSULTATION DATE:  01/01/18 REFERRING MD:  Cruzita Lederer  CHIEF COMPLAINT:  Respiratory Distress   Brief History   [name] is a 84 y.o. female who was initially admitted 11/5 with aphasia.  Had imaging that was negative for CVA.  Has had complicated course with FTT, AoC combined heart failure, AoCKD, acute pulmonary edema, respiratory distress.  On 12/2, had recurrent episode of respiratory distress requiring intubation.  History of present illness   Pt is encephelopathic; therefore, this HPI is obtained from chart review. [name] is a 84 y.o. female who has a PMH as outlined below (see "past medical history").  She [facility] to [facility] 11/5 with aphasia.  On 11/5, son had noticed that she was slurring words; therefore, he brought her to the hospital for further evaluation.  She had CT and MRI which was negative for CVA.  She was found to have UTI and was started on Rocephin which was later changed to ampicillin and later zosyn.  Hospital course was complicated by failure to thrive.  Palliative care consulted 11/13 for goals of care.  Family opted for full code / full scope of care as feared mother would not receive appropriate treatment otherwise (Son is apparently a Copy).  Son requested neurology consultation on 11/20 for FTT as well as decreasing memory.  Neuro felt that her presentation was suggestive of a possible mixed underlying dementia process with cognitive worsening precipitated by intercurrent illness.  On 11/21, she had an episode of acute respiratory distress.  Was found to have acute pulmonary edema as well as elevated troponin felt to be due to NSTEMI.  She was treated with heparin, statin, beta-blocker, Plavix and Lasix.  An echocardiogram was obtained and demonstrated EF 30% with G2DD.  On 12/2, she had recurrent episode to the point that she was almost agonal.  PCCM was  called in consultation.  I had lengthy discussion with pt's daughter and son (Copy).  Son frustrated that pt not on nitro gtt, feels that pt has deteriorated "because of what we have done to her".  Of note, she apparently was recently diagnosed with achalasia after 50 lb weight loss over the past 1 year.  She had been evaluated by GI and had an appointment as an outpatient on 11/6.  Due to this, she has had trouble eating and maintaining weight; hence, the 50lb weight loss and FTT.  Past Medical History  HTN, combined heart failure, CAD, cardiomyopathy, CKD, DM II, FTT.  Significant Hospital Events   11/5 > admit. 11/10 > G tube placed by IR. 11/13 > PMT consulted. 11/20 > neuro consult.  Rapid response for respiratory distress, treated with lasix. 11/21 > cardiology consult for NSTEMI. 11/23 > rapid response for respiratory distress, treated with lasix and morphine. 11/27 > ENT consulted for stridor, flexible laryngoscopy negative. 12/1 > rapid response for respiratory distress, treated with morphine. 12/2 > PCCM consulted for agonal respirations, transferred to ICU for intubation. 12/3/>> Stable right atelectasis and effusion  Consults:  Radiology. Palliative Care. Surgery. Neurology.  Signed off Cardiology.  Signed off ENT. PCCM. Nephrology.  Signed off  Procedures:  G tube 11/10 > ETT 12/2 >   Significant Diagnostic Tests:  CT head 11/5 > chronic ischemic disease. MRI brain 11/5 > no acute process. Abd [place] 11/19 > mod size periumbilical hernia with solid density within.   Echo 11/21 > EF  30%, G2DD. Flex laryngoscopy 11/27 > negative. 01/03/2018 MRI of the brain with 3 subcentimeter infarcts in cerebellar region left  Micro Data:  Urine 11/5 > enterococcus faecalis. Blood 11/22 > negative. Urine 11/23 > enterococcus faecalis. Blood 12/2 > no growth to date>> Sputum 12/2 > Moderate gram negative rods Klebsiella pneumoniae resistant to Zosyn  Antimicrobials:    Ceftriaxone 11/5 > 11/6. Ampicillin 11/6 > 11/8. Zosyn 11/24 > 11/29.  12/2 > 12/3 01/03/2018 meropenem>>  Interim history/subjective:  Very agitated this a.m.  Attempted to pull respiratory tube out  Objective:  Blood pressure (!) 140/59, pulse 85, temperature 99.8 F (37.7 C), temperature source Oral, resp. rate 13, height 5\' 3"  (1.6 m), weight 59 kg, SpO2 100 %.    Vent Mode: PRVC FiO2 (%):  [40 %] 40 % Set Rate:  [16 bmp] 16 bmp Vt Set:  [410 mL] 410 mL PEEP:  [5 cmH20] 5 cmH20 Pressure Support:  [8 cmH20] 8 cmH20 Plateau Pressure:  [17 cmH20-20 cmH20] 17 cmH20   Intake/Output Summary (Last 24 hours) at 01/04/2018 1220 Last data filed at 01/04/2018 0800 Gross per 24 hour  Intake 1139.46 ml  Output 1400 ml  Net -260.54 ml   Filed Weights   12/28/17 0452 12/29/17 0300 12/31/17 0600  Weight: 58.1 kg 58 kg 59 kg    Examination: General: Frail elderly female who does not follow commands.  Currently on full mechanical ventilatory support HEENT: Endotracheal tube connected to ventilator CV: Heart sounds are regular regular rate and rhythm PULM: Coarse rhonchi bilaterally, endotracheal tube is full milky yellow dry secretions GI: PEG tube is in place positive bowel sounds GU: Scant suspected vaginal discharge may be bloody Extremities: warm/dry, 1+ edema  Skin: no rashes or lesions    Assessment & Plan:   Acute hypoxic respiratory failure - presumed due to acute pulmonary edema in the setting of AoC / decompensated combined heart failure.  MRI notes new 3 subcentimeter cerebellar infarcts  Plan -Wean as tolerated -Pulmonary toilet - Chest x-ray -Diuresis as tolerated -Poor prognosis with family history for continued aggressive care..  Probable aspiration - copious thick dried secretions noted in oropharynx and around vocal cords during intubation. Leukocytosis Sputum Culture with Klebsiella pneumonia sensitive to imipenem currently on day 1 of meropenem -Zosyn  is been discontinued continue meropenem -Repeat chest x-ray  AoC / decompensated combined heart failure - echo from 11/21 with EF 30% and G2DD.  -Cardiology signed off 01/03/2018 -Continue diuresis as tolerated - Continue plavix.  But note questionable vaginal bleeding continue to assess - D/c hydralazine. -   NSTEMI - s/p heparin gtt. -Nitroglycerin patch - Appreciate cardiology involvement although they signed on 01/03/2018  AoCKD - had worsening after diuresis following previous episode of respiratory distress. creatinine likely overestimates her GFR due to age and sarcopenia  - Nephrology consulted by [facility].>> not candidate for HD due to debilitated functional status and comorbid conditions -Continue diuresis on a daily basis -Avoid nephrotoxic -Not a candidate for hemodialysis per nephrology  Pseudohypocalcemia - corrects to 10.2. -Ionized calcium is 5.1 on 01/03/2018  Hypokalemia Replete as needed Trend bemet  DM II.  Hypoglycemia currently resolved -Sliding scale insulin protocol -PEG tube feedings at goal   Dysphagia. - Currently on tube feedings at goal via PEG   AoC anemia.  Current hemoglobin 8.1 on 01/04/2018 -Transfuse per protocol -CBC -RN reports questionable small amount of vaginal bleeding rule out from rectum.  We will continue to monitor bleeding and hemoglobin hematocrit.  Acute encephalopathy - unclear etiology.  MRI 01/03/2018 shows 3 subcentimeter acute infarcts in left cerebral hemisphere and severe chronic small vessel ischemic disease FTT - per imaging and neuro, picture suggestive of underlying vascular dementia.  Palliative care had been consulted and family opted for continued full scope of treatment. - Minimize sedation -Consider have neurology reevaluate was a new finding of 3 cerebral infarcts that are acute.   Best Practice:  Diet: TF's via PEG currently at goal Pain/Anxiety/Delirium protocol (if indicated): Intermittent  fentanyl/versed. VAP protocol (if indicated): In place. DVT prophylaxis: SCD's. GI prophylaxis: Pantoprazole. Glucose control: SSI. Mobility: Bedrest. Code Status: Full. Family Communication:  pt's daughter at bedside and son over the phone on 12/2.  No family at bedside 12/3. Of note :Son is a Copy and questions why pt is not on a nitroglycerin infusion.  He also states that "[facility] Pontes are the ones who got her in this state from the 27L positive fluid balance".  01/04/2018 no family at bedside. Disposition: ICU.  Labs   CBC: Recent Labs  Lab 12/31/17 0645 01/01/18 0434 01/02/18 0340 01/03/18 0252 01/04/18 0602  WBC 10.2 16.1* 15.1* 16.0* 18.3*  NEUTROABS  --   --   --   --  16.2*  HGB 8.2* 9.6* 8.4* 8.2* 8.1*  HCT 27.9* 32.7* 28.4* 28.1* 27.5*  MCV 96.5 95.6 94.4 96.6 96.5  PLT 278 355 288 277 449   Basic Metabolic Panel: Recent Labs  Lab 12/31/17 0645 01/01/18 0434 01/02/18 0340 01/02/18 1834 01/03/18 0252 01/03/18 1920 01/04/18 0602  NA 144 142 144  --  144  --  143  K 3.8 4.0 2.9*  --  3.7  --  3.1*  CL 102 100 105  --  106  --  106  CO2 31 29 29   --  28  --  26  GLUCOSE 117* 173* 132*  --  101*  --  164*  BUN 51* 51* 47*  --  44*  --  41*  CREATININE 2.10* 2.10* 2.30*  --  2.11*  --  1.99*  CALCIUM 7.9* 8.4* 8.3*  --  8.2*  --  8.2*  MG  --   --  2.0 2.0 2.0 2.0 1.9  PHOS  --   --  3.5 4.0 3.6 3.6 3.5   GFR: Estimated Creatinine Clearance: 16.2 mL/min (A) (by C-G formula based on SCr of 1.99 mg/dL (H)). Recent Labs  Lab 12/31/17 2214 01/01/18 0434 01/01/18 0802 01/01/18 1851 01/02/18 0340 01/03/18 0252 01/04/18 0602  PROCALCITON  --   --   --  1.50 1.34 1.09  --   WBC  --  16.1*  --   --  15.1* 16.0* 18.3*  LATICACIDVEN 1.9 1.3 1.0  --   --   --   --    Liver Function Tests: Recent Labs  Lab 01/01/18 0434 01/03/18 0252  AST 18 17  ALT 17 12  ALKPHOS 47 48  BILITOT 0.9 0.9  PROT 6.3* 5.6*  ALBUMIN 1.7* 1.5*   No results for  input(s): LIPASE, AMYLASE in the last 168 hours. No results for input(s): AMMONIA in the last 168 hours. ABG    Component Value Date/Time   PHART 7.482 (H) 01/03/2018 0420   PCO2ART 42.2 01/03/2018 0420   PO2ART 151.0 (H) 01/03/2018 0420   HCO3 31.5 (H) 01/03/2018 0420   TCO2 33 (H) 01/03/2018 0420   ACIDBASEDEF 2.8 (H) 12/20/2017 2235   O2SAT 99.0 01/03/2018 0420  Coagulation Profile: No results for input(s): INR, PROTIME in the last 168 hours. Cardiac Enzymes: Recent Labs  Lab 01/01/18 0434 01/01/18 1851 01/03/18 0252  TROPONINI 1.03* 0.94* 0.66*   CBG: Recent Labs  Lab 01/03/18 1942 01/03/18 2031 01/03/18 2332 01/04/18 0405 01/04/18 0748  GLUCAP 59* 94 113* 136* 141*    Critical Care time: 31 min.  Richardson Landry Hayleen Clinkscales ACNP Maryanna Shape PCCM Pager [phone] till 1 pm If no answer page [phone] 01/04/2018, 12:20 PM

## 2018-01-05 ENCOUNTER — Inpatient Hospital Stay (HOSPITAL_COMMUNITY): Payer: Medicare Other

## 2018-01-05 ENCOUNTER — Encounter (HOSPITAL_COMMUNITY): Payer: Medicare Other

## 2018-01-05 DIAGNOSIS — I340 Nonrheumatic mitral (valve) insufficiency: Secondary | ICD-10-CM

## 2018-01-05 DIAGNOSIS — R471 Dysarthria and anarthria: Secondary | ICD-10-CM

## 2018-01-05 DIAGNOSIS — I6340 Cerebral infarction due to embolism of unspecified cerebral artery: Secondary | ICD-10-CM

## 2018-01-05 DIAGNOSIS — I639 Cerebral infarction, unspecified: Secondary | ICD-10-CM

## 2018-01-05 DIAGNOSIS — I361 Nonrheumatic tricuspid (valve) insufficiency: Secondary | ICD-10-CM

## 2018-01-05 DIAGNOSIS — I370 Nonrheumatic pulmonary valve stenosis: Secondary | ICD-10-CM

## 2018-01-05 LAB — CBC WITH DIFFERENTIAL/PLATELET
Abs Immature Granulocytes: 0.1 10*3/uL — ABNORMAL HIGH (ref 0.00–0.07)
Basophils Absolute: 0 10*3/uL (ref 0.0–0.1)
Basophils Relative: 0 %
Eosinophils Absolute: 0.1 10*3/uL (ref 0.0–0.5)
Eosinophils Relative: 1 %
HCT: 28.4 % — ABNORMAL LOW (ref 36.0–46.0)
Hemoglobin: 8.5 g/dL — ABNORMAL LOW (ref 12.0–15.0)
Immature Granulocytes: 1 %
Lymphocytes Relative: 8 %
Lymphs Abs: 1.2 10*3/uL (ref 0.7–4.0)
MCH: 28.7 pg (ref 26.0–34.0)
MCHC: 29.9 g/dL — ABNORMAL LOW (ref 30.0–36.0)
MCV: 95.9 fL (ref 80.0–100.0)
MONO ABS: 1.2 10*3/uL — AB (ref 0.1–1.0)
Monocytes Relative: 8 %
Neutro Abs: 12.3 10*3/uL — ABNORMAL HIGH (ref 1.7–7.7)
Neutrophils Relative %: 82 %
Platelets: 212 10*3/uL (ref 150–400)
RBC: 2.96 MIL/uL — ABNORMAL LOW (ref 3.87–5.11)
RDW: 16.5 % — ABNORMAL HIGH (ref 11.5–15.5)
WBC: 14.9 10*3/uL — ABNORMAL HIGH (ref 4.0–10.5)
nRBC: 0 % (ref 0.0–0.2)

## 2018-01-05 LAB — GLUCOSE, CAPILLARY
Glucose-Capillary: 113 mg/dL — ABNORMAL HIGH (ref 70–99)
Glucose-Capillary: 124 mg/dL — ABNORMAL HIGH (ref 70–99)
Glucose-Capillary: 79 mg/dL (ref 70–99)
Glucose-Capillary: 86 mg/dL (ref 70–99)
Glucose-Capillary: 96 mg/dL (ref 70–99)

## 2018-01-05 LAB — LIPID PANEL
Cholesterol: 73 mg/dL (ref 0–200)
HDL: 21 mg/dL — ABNORMAL LOW (ref >40)
LDL Cholesterol: 40 mg/dL (ref 0–99)
Total CHOL/HDL Ratio: 3.5 RATIO
Triglycerides: 60 mg/dL (ref <150)
VLDL: 12 mg/dL (ref 0–40)

## 2018-01-05 LAB — MAGNESIUM: Magnesium: 2 mg/dL (ref 1.7–2.4)

## 2018-01-05 LAB — ECHOCARDIOGRAM LIMITED
HEIGHTINCHES: 63 in
WEIGHTICAEL: 2080 [oz_av]

## 2018-01-05 LAB — BASIC METABOLIC PANEL
Anion gap: 11 (ref 5–15)
BUN: 41 mg/dL — AB (ref 8–23)
CHLORIDE: 107 mmol/L (ref 98–111)
CO2: 26 mmol/L (ref 22–32)
Calcium: 8.2 mg/dL — ABNORMAL LOW (ref 8.9–10.3)
Creatinine, Ser: 1.91 mg/dL — ABNORMAL HIGH (ref 0.44–1.00)
GFR calc Af Amer: 27 mL/min — ABNORMAL LOW (ref >60)
GFR calc non Af Amer: 23 mL/min — ABNORMAL LOW (ref >60)
Glucose, Bld: 124 mg/dL — ABNORMAL HIGH (ref 70–99)
POTASSIUM: 3.7 mmol/L (ref 3.5–5.1)
Sodium: 144 mmol/L (ref 135–145)

## 2018-01-05 LAB — PHOSPHORUS: Phosphorus: 2.9 mg/dL (ref 2.5–4.6)

## 2018-01-05 MED ORDER — CHLORHEXIDINE GLUCONATE 0.12 % MT SOLN
15.0000 mL | Freq: Two times a day (BID) | OROMUCOSAL | Status: DC
  Administered 2018-01-06 – 2018-02-26 (×99): 15 mL via OROMUCOSAL
  Filled 2018-01-05 (×85): qty 15

## 2018-01-05 MED ORDER — SODIUM CHLORIDE 0.9 % IV SOLN
INTRAVENOUS | Status: DC | PRN
  Administered 2018-01-05 – 2018-01-07 (×3): 500 mL via INTRAVENOUS

## 2018-01-05 MED ORDER — FUROSEMIDE 10 MG/ML IJ SOLN
40.0000 mg | Freq: Once | INTRAMUSCULAR | Status: AC
  Administered 2018-01-05: 40 mg via INTRAVENOUS
  Filled 2018-01-05: qty 4

## 2018-01-05 MED ORDER — FUROSEMIDE 40 MG PO TABS
40.0000 mg | ORAL_TABLET | Freq: Every day | ORAL | Status: DC
  Administered 2018-01-06 – 2018-01-11 (×6): 40 mg
  Filled 2018-01-05 (×6): qty 1

## 2018-01-05 MED ORDER — HYDRALAZINE HCL 20 MG/ML IJ SOLN: 5.0000 mg | Freq: Four times a day (QID) | INTRAMUSCULAR | Status: DC | PRN

## 2018-01-05 MED ORDER — HYDRALAZINE HCL 20 MG/ML IJ SOLN
5.0000 mg | Freq: Four times a day (QID) | INTRAMUSCULAR | Status: DC | PRN
  Administered 2018-01-05 – 2018-01-06 (×2): 5 mg via INTRAVENOUS
  Filled 2018-01-05 (×2): qty 1

## 2018-01-05 MED ORDER — POTASSIUM CHLORIDE 20 MEQ/15ML (10%) PO SOLN
40.0000 meq | Freq: Once | ORAL | Status: AC
  Administered 2018-01-05: 40 meq
  Filled 2018-01-05: qty 30

## 2018-01-05 MED ORDER — ORAL CARE MOUTH RINSE
15.0000 mL | Freq: Two times a day (BID) | OROMUCOSAL | Status: DC
  Administered 2018-01-06 – 2018-02-25 (×91): 15 mL via OROMUCOSAL

## 2018-01-05 MED ORDER — ALTEPLASE 2 MG IJ SOLR
2.0000 mg | Freq: Once | INTRAMUSCULAR | Status: AC
  Administered 2018-01-05: 2 mg

## 2018-01-05 NOTE — Progress Notes (Signed)
NAME:  [name], MRN:  [id number], DOB:  [birth year], LOS: 33 ADMISSION DATE:  12/05/2017, CONSULTATION DATE:  01/01/18 REFERRING MD:  Cruzita Lederer  CHIEF COMPLAINT:  Respiratory Distress   Brief History   [name] is a 84 y.o. female who was initially admitted 11/5 with aphasia.  Had imaging that was negative for CVA.  Has had complicated course with FTT, AoC combined heart failure, AoCKD, acute pulmonary edema, respiratory distress.  On 12/2, had recurrent episode of respiratory distress requiring intubation.  History of present illness   Pt is encephelopathic; therefore, this HPI is obtained from chart review. [name] is a 84 y.o. female who has a PMH as outlined below (see "past medical history").  She presented to [facility] 11/5 with aphasia.  On 11/5, son had noticed that she was slurring words; therefore, he brought her to the hospital for further evaluation.  She had CT and MRI which was negative for CVA.  She was found to have UTI and was started on Rocephin which was later changed to ampicillin and later zosyn.  Hospital course was complicated by failure to thrive.  Palliative care consulted 11/13 for goals of care.  Family opted for full code / full scope of care as feared mother would not receive appropriate treatment otherwise (Son is apparently a Copy).  Son requested neurology consultation on 11/20 for FTT as well as decreasing memory.  Neuro felt that her presentation was suggestive of a possible mixed underlying dementia process with cognitive worsening precipitated by intercurrent illness.  On 11/21, she had an episode of acute respiratory distress.  Was found to have acute pulmonary edema as well as elevated troponin felt to be due to NSTEMI.  She was treated with heparin, statin, beta-blocker, Plavix and Lasix.  An echocardiogram was obtained and demonstrated EF 30% with G2DD.  On 12/2, she had recurrent episode to the point that she was almost agonal.  PCCM was  called in consultation.  I had lengthy discussion with pt's daughter and son (Copy).  Son frustrated that pt not on nitro gtt, feels that pt has deteriorated "because of what we have done to her".  Of note, she apparently was recently diagnosed with achalasia after 50 lb weight loss over the past 1 year.  She had been evaluated by GI and had an appointment as an outpatient on 11/6.  Due to this, she has had trouble eating and maintaining weight; hence, the 50lb weight loss and FTT.  Past Medical History  HTN, combined heart failure, CAD, cardiomyopathy, CKD, DM II, FTT.  Significant Hospital Events   11/5 > admit. 11/10 > G tube placed by IR. 11/13 > PMT consulted. 11/20 > neuro consult.  Rapid response for respiratory distress, treated with lasix. 11/21 > cardiology consult for NSTEMI. 11/23 > rapid response for respiratory distress, treated with lasix and morphine. 11/27 > ENT consulted for stridor, flexible laryngoscopy negative. 12/1 > rapid response for respiratory distress, treated with morphine. 12/2 > PCCM consulted for agonal respirations, transferred to ICU for intubation. 12/3/>> Stable right atelectasis and effusion 01/05/2018 attempted extubation  Consults:  Radiology. Palliative Care. Surgery. Neurology.  Signed off Cardiology.  Signed off ENT. PCCM. Nephrology.  Signed off  Procedures:  G tube 11/10 > ETT 12/2 >   Significant Diagnostic Tests:  CT head 11/5 > chronic ischemic disease. MRI brain 11/5 > no acute process. Abd [place] 11/19 > mod size periumbilical hernia with solid density within.   Echo  11/21 > EF 30%, G2DD. Flex laryngoscopy 11/27 > negative. 01/03/2018 MRI of the brain with 3 subcentimeter infarcts in cerebellar region left  Micro Data:  Urine 11/5 > enterococcus faecalis. Blood 11/22 > negative. Urine 11/23 > enterococcus faecalis. Blood 12/2 > no growth to date>> Sputum 12/2 > Moderate gram negative rods Klebsiella pneumoniae resistant  to Zosyn  Antimicrobials:  Ceftriaxone 11/5 > 11/6. Ampicillin 11/6 > 11/8. Zosyn 11/24 > 11/29.  12/2 > 12/3 01/03/2018 meropenem>>  Interim history/subjective:  Very agitated this a.m.  Attempted to pull respiratory tube out  Objective:  Blood pressure (!) 149/57, pulse 77, temperature 98.1 F (36.7 C), resp. rate 11, height 5\' 3"  (1.6 m), weight 59 kg, SpO2 94 %.    Vent Mode: PRVC FiO2 (%):  [40 %] 40 % Set Rate:  [16 bmp] 16 bmp Vt Set:  [410 mL] 410 mL PEEP:  [5 cmH20] 5 cmH20 Plateau Pressure:  [17 cmH20-18 cmH20] 17 cmH20   Intake/Output Summary (Last 24 hours) at 01/05/2018 0916 Last data filed at 01/05/2018 0900 Gross per 24 hour  Intake 3063.75 ml  Output 825 ml  Net 2238.75 ml   Filed Weights   12/28/17 0452 12/29/17 0300 12/31/17 0600  Weight: 58.1 kg 58 kg 59 kg    Examination: General: Frail female who is awake on ventilatory support HEENT: Tube in place Neuro: Commands moves all extremities lips and 6 tongue CV: Sounds are regular regular rate and rhythm PULM: even/non-labored, lungs bilaterally decreased breath sounds in bases DX:IPJA, non-tender, bsx4 active, PEG in place tube feedings at goal Extremities: warm/dry 1+ edema  Skin: no rashes or lesions     Assessment & Plan:   Acute hypoxic respiratory failure - presumed due to acute pulmonary edema in the setting of AoC / decompensated combined heart failure.  MRI notes new 3 subcentimeter cerebellar infarcts  Plan -01/05/2018 wean and extubate -We need BiPAP - Serial chest x-ray -Diuresis as tolerated   Probable aspiration - copious thick dried secretions noted in oropharynx and around vocal cords during intubation. Leukocytosis Sputum Culture with Klebsiella pneumonia sensitive to imipenem currently on day 2 of meropenem Urine culture with enterococcus faecalis sensitive to ampicillin, notes she been treated with Zosyn for multiple days.  -Zosyn is been discontinued continue  meropenem -Repeat chest x-ray  AoC / decompensated combined heart failure - echo from 11/21 with EF 30% and G2DD.  -Cardiology signed off -Diuresis as tolerated -Continue Plavix but monitor for vaginal bleeding -Plan to repeat 2D echo 01/05/2018  NSTEMI - s/p heparin gtt. -Glycerin patch - Cardiology signed on 01/03/2018  AoCKD - had worsening after diuresis following previous episode of respiratory distress. creatinine likely overestimates her GFR due to age and sarcopenia  01/05/2018 creatinine 1.91  -Nephrology is following not a candidate for hemodialysis -Continue diuresis -Avoid nephrotoxins -1 dose of IV Lasix 01/05/2018 for extubation  Pseudohypocalcemia - corrects to 10.2. -Ionized calcium is 5.1 on 01/03/2018  Hypokalemia -Replete as needed -Monitor potassium  DM II.  Hypoglycemia currently resolved -Sliding scale insulin protocol    Dysphagia. -Continue tube feedings via PEG   AoC anemia.  Hemoglobin 8.5 on 01/05/2018 -Protocol transfuse -Follow CBC -Questionable vaginal bleeding.  Acute encephalopathy - unclear etiology.  MRI 01/03/2018 shows 3 subcentimeter acute infarcts in left cerebral hemisphere and severe chronic small vessel ischemic disease FTT - per imaging and neuro, picture suggestive of underlying vascular dementia.  Palliative care had been consulted and family opted for continued full scope of  treatment. - Minimize sedation -Neurology is following   Best Practice:  Diet: TF's via PEG currently at goal Pain/Anxiety/Delirium protocol (if indicated): Intermittent fentanyl/versed. VAP protocol (if indicated): In place. DVT prophylaxis: SCD's. GI prophylaxis: Pantoprazole. Glucose control: SSI. Mobility: Bedrest. Code Status: Full. Family Communication:  pt's daughter at bedside and son over the phone on 12/2.  No family at bedside 12/3. Of note :Son is a Copy and questions why pt is not on a nitroglycerin infusion.  He also  states that "[facility] Pontes are the ones who got her in this state from the 27L positive fluid balance".  01/04/2018 no family at bedside. Disposition: ICU.  01/05/2018 son updated at length at bedside by Dr. Loanne Drilling.  Labs   CBC: Recent Labs  Lab 01/01/18 0434 01/02/18 0340 01/03/18 0252 01/04/18 0602 01/05/18 0510  WBC 16.1* 15.1* 16.0* 18.3* 14.9*  NEUTROABS  --   --   --  16.2* 12.3*  HGB 9.6* 8.4* 8.2* 8.1* 8.5*  HCT 32.7* 28.4* 28.1* 27.5* 28.4*  MCV 95.6 94.4 96.6 96.5 95.9  PLT 355 288 277 264 115   Basic Metabolic Panel: Recent Labs  Lab 01/01/18 0434  01/02/18 0340 01/02/18 1834 01/03/18 0252 01/03/18 1920 01/04/18 0602 01/05/18 0510  NA 142  --  144  --  144  --  143 144  K 4.0  --  2.9*  --  3.7  --  3.1* 3.7  CL 100  --  105  --  106  --  106 107  CO2 29  --  29  --  28  --  26 26  GLUCOSE 173*  --  132*  --  101*  --  164* 124*  BUN 51*  --  47*  --  44*  --  41* 41*  CREATININE 2.10*  --  2.30*  --  2.11*  --  1.99* 1.91*  CALCIUM 8.4*  --  8.3*  --  8.2*  --  8.2* 8.2*  MG  --    < > 2.0 2.0 2.0 2.0 1.9 2.0  PHOS  --    < > 3.5 4.0 3.6 3.6 3.5 2.9   < > = values in this interval not displayed.   GFR: Estimated Creatinine Clearance: 16.8 mL/min (A) (by C-G formula based on SCr of 1.91 mg/dL (H)). Recent Labs  Lab 12/31/17 2214 01/01/18 0434 01/01/18 0802 01/01/18 1851 01/02/18 0340 01/03/18 0252 01/04/18 0602 01/05/18 0510  PROCALCITON  --   --   --  1.50 1.34 1.09  --   --   WBC  --  16.1*  --   --  15.1* 16.0* 18.3* 14.9*  LATICACIDVEN 1.9 1.3 1.0  --   --   --   --   --    Liver Function Tests: Recent Labs  Lab 01/01/18 0434 01/03/18 0252  AST 18 17  ALT 17 12  ALKPHOS 47 48  BILITOT 0.9 0.9  PROT 6.3* 5.6*  ALBUMIN 1.7* 1.5*   No results for input(s): LIPASE, AMYLASE in the last 168 hours. No results for input(s): AMMONIA in the last 168 hours. ABG    Component Value Date/Time   PHART 7.482 (H) 01/03/2018 0420   PCO2ART 42.2  01/03/2018 0420   PO2ART 151.0 (H) 01/03/2018 0420   HCO3 31.5 (H) 01/03/2018 0420   TCO2 33 (H) 01/03/2018 0420   ACIDBASEDEF 2.8 (H) 12/20/2017 2235   O2SAT 99.0 01/03/2018 0420    Coagulation Profile: No  results for input(s): INR, PROTIME in the last 168 hours. Cardiac Enzymes: Recent Labs  Lab 01/01/18 0434 01/01/18 1851 01/03/18 0252  TROPONINI 1.03* 0.94* 0.66*   CBG: Recent Labs  Lab 01/04/18 1221 01/04/18 1628 01/04/18 1930 01/04/18 2310 01/05/18 0312  GLUCAP 145* 119* 86 112* 113*    Critical Care time: 31 min.  Richardson Landry Jil Penland ACNP Maryanna Shape PCCM Pager [phone] till 1 pm If no answer page [phone] 01/05/2018, 9:16 AM

## 2018-01-05 NOTE — Progress Notes (Signed)
ELECTROENCEPHALOGRAM REPORT Date of Study: 01/04/18 MRN: [id number]  Clinical History: 84 year old woman with past history of hypertension, type 2 diabetes, stroke in 2018 with left-sided weakness-unclear what residual, shortness of breath, hypertension, chronic kidney disease, cardiomyopathy and CAD admitted to the hospital 1 month ago for failure to thrive after she was diagnosed with achalasia and had a 50 pound weight loss over the past year.  She was treated with Botox for achalasia in October sent to a skilled nursing facility with hopes that her swallowing would improve but she has not done well since then.  She had difficulty getting her words out and stuttering for which an initial neurological consultation was placed.  Her symptoms have progressed to the point where the patient did not want to have any conversation and appeared confused and altered  Medications:Versed PRN  Technical Summary:  A multichannel digital EEG recording measured by the international 10-20 system with electrodes applied with paste and impedances below 5000 ohms performed in our laboratory with EKG monitoring in comatose patient. Hyperventilation and photic stimulation were not performed. The digital EEG was referentially recorded, reformatted, and digitally filtered in a variety of bipolar and referential montages for optimal display.  Description:  The patient is comatose and not responsive during the recording. EEG showing a generalized slow background with focal slowing on the Left. No sharps were seen. There were no electrographic seizures seen.  EKG lead was unremarkable.  Impression:  This coma EEG is abnormal due to generalized slowing with focal slowing on the left that might represent a structural lesion, There were no electrographic seizures in this study.

## 2018-01-05 NOTE — Progress Notes (Signed)
TF held by [facility] NP, anticipating extubation this AM

## 2018-01-05 NOTE — Progress Notes (Addendum)
STROKE TEAM PROGRESS NOTE   SUBJECTIVE (INTERVAL HISTORY) Her RN is at the bedside.  Overall her condition is gradually improving. And plan to extubate this am. As per RN, pt son Dr. Mayer Masker (plastic surgeon) came this am and was updated by CCM Dr. Loanne Drilling.    OBJECTIVE Temp:  [97.5 F (36.4 C)-99.3 F (37.4 C)] 98.1 F (36.7 C) (12/06 0800) Pulse Rate:  [68-89] 89 (12/06 0958) Cardiac Rhythm: Normal sinus rhythm (12/06 0400) Resp:  [11-19] 11 (12/06 0900) BP: (115-160)/(46-97) 160/71 (12/06 0958) SpO2:  [87 %-100 %] 94 % (12/06 0900) FiO2 (%):  [40 %] 40 % (12/06 0729)  Recent Labs  Lab 01/04/18 1221 01/04/18 1628 01/04/18 1930 01/04/18 2310 01/05/18 0312  GLUCAP 145* 119* 86 112* 113*   Recent Labs  Lab 01/01/18 0434  01/02/18 0340 01/02/18 1834 01/03/18 0252 01/03/18 1920 01/04/18 0602 01/05/18 0510  NA 142  --  144  --  144  --  143 144  K 4.0  --  2.9*  --  3.7  --  3.1* 3.7  CL 100  --  105  --  106  --  106 107  CO2 29  --  29  --  28  --  26 26  GLUCOSE 173*  --  132*  --  101*  --  164* 124*  BUN 51*  --  47*  --  44*  --  41* 41*  CREATININE 2.10*  --  2.30*  --  2.11*  --  1.99* 1.91*  CALCIUM 8.4*  --  8.3*  --  8.2*  --  8.2* 8.2*  MG  --    < > 2.0 2.0 2.0 2.0 1.9 2.0  PHOS  --    < > 3.5 4.0 3.6 3.6 3.5 2.9   < > = values in this interval not displayed.   Recent Labs  Lab 01/01/18 0434 01/03/18 0252  AST 18 17  ALT 17 12  ALKPHOS 47 48  BILITOT 0.9 0.9  PROT 6.3* 5.6*  ALBUMIN 1.7* 1.5*   Recent Labs  Lab 01/01/18 0434 01/02/18 0340 01/03/18 0252 01/04/18 0602 01/05/18 0510  WBC 16.1* 15.1* 16.0* 18.3* 14.9*  NEUTROABS  --   --   --  16.2* 12.3*  HGB 9.6* 8.4* 8.2* 8.1* 8.5*  HCT 32.7* 28.4* 28.1* 27.5* 28.4*  MCV 95.6 94.4 96.6 96.5 95.9  PLT 355 288 277 264 212   Recent Labs  Lab 01/01/18 0434 01/01/18 1851 01/03/18 0252  TROPONINI 1.03* 0.94* 0.66*   No results for input(s): LABPROT, INR in the last 72 hours. No results  for input(s): COLORURINE, LABSPEC, [place], GLUCOSEU, HGBUR, BILIRUBINUR, KETONESUR, PROTEINUR, UROBILINOGEN, NITRITE, LEUKOCYTESUR in the last 72 hours.  Invalid input(s): APPERANCEUR     Component Value Date/Time   CHOL 73 01/05/2018 0510   TRIG 60 01/05/2018 0510   HDL 21 (L) 01/05/2018 0510   CHOLHDL 3.5 01/05/2018 0510   VLDL 12 01/05/2018 0510   LDLCALC 40 01/05/2018 0510   Lab Results  Component Value Date   HGBA1C 5.9 (H) 12/24/2017      Component Value Date/Time   LABOPIA NONE DETECTED 09/20/2016 1530   COCAINSCRNUR NONE DETECTED 09/20/2016 1530   LABBENZ NONE DETECTED 09/20/2016 1530   AMPHETMU NONE DETECTED 09/20/2016 1530   THCU NONE DETECTED 09/20/2016 1530   LABBARB NONE DETECTED 09/20/2016 1530    No results for input(s): ETH in the last 168 hours.  I have personally reviewed  the radiological images below and agree with the radiology interpretations.  Mr Brain Wo Contrast  Result Date: 01/03/2018 CLINICAL DATA:  Stroke follow-up. EXAM: MRI HEAD WITHOUT CONTRAST TECHNIQUE: Multiplanar, multiecho pulse sequences of the brain and surrounding structures were obtained without intravenous contrast. COMPARISON:  12/05/2017 FINDINGS: Brain: There are 3 subcentimeter foci of restricted diffusion compatible with acute infarcts involving posterior left temporal lobe cortex, left postcentral gyrus, and cortex or subcortical white matter of the left middle frontal gyrus. A subcentimeter focus of diffusion abnormality in the splenium of the corpus callosum has also been present on prior studies and, while more conspicuous today, is favored to be artifactual rather than reflecting an acute infarct. No intracranial hemorrhage, mass, midline shift, or extra-axial fluid collection is identified. There is moderate cerebral atrophy. Extensive T2 hyperintensities throughout the cerebral white matter and brainstem are unchanged and nonspecific but compatible with severe chronic small vessel  ischemic disease. Chronic lacunar infarcts are again noted in the basal ganglia and thalami. There is also a small chronic cortical infarct in the right precentral gyrus. Vascular: Major intracranial vascular flow voids are preserved. Skull and upper cervical spine: Diffusely heterogeneous bone marrow signal without suspicious focal lesion identified. Sinuses/Orbits: Bilateral cataract extraction. Clear paranasal sinuses. Trace left mastoid effusion. Other: None. IMPRESSION: 1. Three subcentimeter acute infarcts in the left cerebral hemisphere. 2. Severe chronic small vessel ischemic disease. Electronically Signed   By: Logan Bores M.D.   On: 01/03/2018 15:07   US Abdomen Limited  Result Date: 12/19/2017 CLINICAL DATA:  Periumbilical nodule. EXAM: ULTRASOUND ABDOMEN LIMITED COMPARISON:  CT scan of November 04, 2017. FINDINGS: Limited sonographic evaluation of the periumbilical region demonstrates moderate size hernia. There is solid density within the hernia which may represent bowel loops, although no peristalsis is observed. The possibility of this representing enlarged lymph node or other neoplasm cannot be excluded. IMPRESSION: Moderate size periumbilical hernia is noted. Solid density is noted within the hernia which may represent bowel loop, but enlarged lymph node or neoplasm cannot be excluded. Clinical correlation is recommended. Electronically Signed   By: Marijo Conception, M.D.   On: 12/19/2017 12:44   TTE 12/21/17 - Normal LV size with mild LV hypertrophy. EF 30%, diffuse   hypokinesis. Moderate diastolic dysfunction. Normal RV size with   mildly decreased systolic function. Mild to moderate MR.  Carotid Doppler  pending  TTE pending  LE venous doppler pending   PHYSICAL EXAM  Temp:  [97.5 F (36.4 C)-99.3 F (37.4 C)] 98.1 F (36.7 C) (12/06 0800) Pulse Rate:  [68-89] 89 (12/06 0958) Resp:  [11-19] 11 (12/06 0900) BP: (115-160)/(46-97) 160/71 (12/06 0958) SpO2:  [87 %-100 %] 94  % (12/06 0900) FiO2 (%):  [40 %] 40 % (12/06 0729)  General - Well nourished, well developed, intubated, mildly agitated.  Ophthalmologic - fundi not visualized due to noncooperation.  Cardiovascular - Regular rate and rhythm.  Neuro - awake, eyes open, still intubated, off sedation, mildly agitated and trying to self extubate. Followed commands to close eyes, showing two fingers and thrumb on the right hand, but did not follow other simple commands. PERRL, EOMI, no gaze preference, not consistently blinking to visual threat bilaterally but tracking to objects in both visual fields. Facial symmetry not able to test due to ET tube, tongue protrusion not able to cooperate. Moving all extremities symmetrically with prompt and pain stimulation. DTR 1+ and b/l babinski. Sensation, coordination and gait not tested.    ASSESSMENT/PLAN Ms. Darlene Critchley  [name] is a 84 y.o. female with history of HTN, HLD, DM, CKD, cardiomyopathy, CAD s/p CABG, stroke admitted on 12/05/17 for failure to thrive. Found to have achalasia with weight loss and was treated with botox. Pt was also found to have episode of aphasia, neurology consulted and MRI was negative, considered encephalopathy given multiple medical issues at that time. Pt eventually received PEG tube for nutrition. However, her admission was complicated by pulmonary edema, CHF with EF 30%, aspiration and was treated with zosyn and vanco, NSTEMI with elevated troponin and now off heparin due to anemia required transfusion, UTI. she had further respiratory failure on 01/01/18 and was intubated and transferred to 4N with CCM management. Sputum culture showed Klebsiella and now on meropenum. MRI brain showed 3 punctate infarcts at left temporal and frontal lobes. Neurology was called back for recommendations.    Incidental stroke - 3 punctate left temproal and left frontal infarcts, etiology unclear, could be due to cardioembolic with occult PAF or cardiomyopathy with low  EF, paradoxical emboli, or hypercoagulable state with FTT and sepsis - however, this will not explain pt encephalopathy  MRI punctate left temproal and left frontal infarcts  MRA hold off for now given agitation and not change management   Carotid Doppler  Pending   2D Echo  12/21/17 EF 30%  TTE repeat pending  LE venous doppler pending  CT abd/pelvis 10/2017 no malignancy   LDL 40  HgbA1c 5.9  SCDs for VTE prophylaxis  No antithrombotic prior to admission, now on clopidogrel 75 mg daily.   Ongoing aggressive stroke risk factor management  Therapy recommendations: pending    Disposition:  Pending   Encephalopathy due to multiple medical conditions  Multifactorial   FTT  Pulmonary edema  CHF with low EF  Anemia   UTI and aspiration pneumonia  NSTEMI with elevated troponin  Respiratory failure  Intubated on 01/01/18  Plan to extubate today  Aspiration pneumonia with sputum culture Klebsiella  On meropenum  CCM on board  History of stroke  08/2016 left sided weakness and numbness, MRI right thalamic and IC infarct. MRA head and neck neg. EF 55-60%, LDL 101 and A1C 7.7 - put on ASA and plavix and lipitor 40 and discharged to CIR  01/2017 followed with [facility] for fall and left leg injury  Diabetes  HgbA1c 5.9 goal < 7.0  Controlled  On levemir  CBG monitoring  SSI  close PCP follow up  Hypertension . Stable  BP goal normotensive  CCM on board  Hyperlipidemia  Home meds:  lipitor 40  LDL 40, goal < 70  Now on lipitor 40  Continue statin at discharge  CKD stage III  Cre 2.11->1.99->1.91  On IVF  Other Stroke Risk Factors  Advanced age  CHF with low EF  CAD s/p CABG  Other Active Problems  Leukocytosis WBC 18.3->14.9  Anemia due to CKD - Hb 8.1->8.5  Hospital day # 27  This patient is critically ill due to FTT, sepsis, UTI, aspiration pneumonia, respiratory failure, stroke, NSTEMI and at significant risk of  neurological worsening, death form recurrent stroke, heart failure, seizure and sepsis. This patient's care requires constant monitoring of vital signs, hemodynamics, respiratory and cardiac monitoring, review of multiple databases, neurological assessment, discussion with family, other specialists and medical decision making of high complexity. I spent 40 minutes of neurocritical care time in the care of this patient.   Rosalin Hawking, MD PhD Stroke Neurology 01/05/2018 10:55 AM    To contact  Stroke Continuity provider, please refer to [email or web address] After hours, contact General Neurology

## 2018-01-05 NOTE — Progress Notes (Signed)
Carotid artery duplex and lower extremity venous duplex completed. Refer to "CV Proc" under chart review to view preliminary results.  01/05/2018 4:46 PM Maudry Mayhew, MHA, RVT, RDCS, RDMS

## 2018-01-05 NOTE — Progress Notes (Signed)
SLP Cancellation Note  Patient Details Name: [name] MRN: [id number] DOB: January 06, 1928   Cancelled treatment:       Reason Eval/Treat Not Completed: Patient not medically ready. Pt still intubated, plans to extubate soon. Given history of dysphagia and PEG tube as well as complexity of pts condition, will plan to initiate therapy for dysphagia on Monday 12/9 if pt remain appropriate. Discussed with RN.   Herbie Baltimore, MA CCC-SLP  Acute Rehabilitation Services Pager [phone] Office [phone]  Lynann Beaver 01/05/2018, 10:59 AM

## 2018-01-05 NOTE — Progress Notes (Signed)
  Echocardiogram 2D Echocardiogram has been performed.  Jennette Dubin 01/05/2018, 11:46 AM

## 2018-01-05 NOTE — Procedures (Signed)
Extubation Procedure Note  Patient Details:   Name: [name] DOB: 1927/04/12 MRN: [id number]   Airway Documentation:    Vent end date: 01/05/18 Vent end time: 1100   Evaluation  O2 sats: stable throughout Complications: No apparent complications Patient did tolerate procedure well. Bilateral Breath Sounds: Rhonchi, Diminished   Yes   Pt extubated to 3L, increased to 5L [place] per MD order. Very slight cuff leak heard prior to extubation however, pt attempting to reach for tube and very restless. VS within normal limits. Pt has a strong non-productive cough post extubation. RN at bedside. RT will closely monitor for possible bipap needs  Jesse Sans 01/05/2018, 11:06 AM

## 2018-01-06 ENCOUNTER — Inpatient Hospital Stay (HOSPITAL_COMMUNITY): Payer: Medicare Other

## 2018-01-06 LAB — BASIC METABOLIC PANEL
ANION GAP: 12 (ref 5–15)
BUN: 42 mg/dL — ABNORMAL HIGH (ref 8–23)
CO2: 24 mmol/L (ref 22–32)
Calcium: 8.7 mg/dL — ABNORMAL LOW (ref 8.9–10.3)
Chloride: 110 mmol/L (ref 98–111)
Creatinine, Ser: 1.8 mg/dL — ABNORMAL HIGH (ref 0.44–1.00)
GFR calc Af Amer: 29 mL/min — ABNORMAL LOW (ref >60)
GFR calc non Af Amer: 25 mL/min — ABNORMAL LOW (ref >60)
Glucose, Bld: 164 mg/dL — ABNORMAL HIGH (ref 70–99)
Potassium: 3.4 mmol/L — ABNORMAL LOW (ref 3.5–5.1)
Sodium: 146 mmol/L — ABNORMAL HIGH (ref 135–145)

## 2018-01-06 LAB — CBC WITH DIFFERENTIAL/PLATELET
Abs Immature Granulocytes: 0.08 10*3/uL — ABNORMAL HIGH (ref 0.00–0.07)
Basophils Absolute: 0 10*3/uL (ref 0.0–0.1)
Basophils Relative: 0 %
Eosinophils Absolute: 0 10*3/uL (ref 0.0–0.5)
Eosinophils Relative: 0 %
HEMATOCRIT: 29 % — AB (ref 36.0–46.0)
Hemoglobin: 8.7 g/dL — ABNORMAL LOW (ref 12.0–15.0)
Immature Granulocytes: 1 %
Lymphocytes Relative: 4 %
Lymphs Abs: 0.7 10*3/uL (ref 0.7–4.0)
MCH: 29 pg (ref 26.0–34.0)
MCHC: 30 g/dL (ref 30.0–36.0)
MCV: 96.7 fL (ref 80.0–100.0)
Monocytes Absolute: 0.8 10*3/uL (ref 0.1–1.0)
Monocytes Relative: 5 %
NEUTROS ABS: 15.4 10*3/uL — AB (ref 1.7–7.7)
Neutrophils Relative %: 90 %
Platelets: 283 10*3/uL (ref 150–400)
RBC: 3 MIL/uL — ABNORMAL LOW (ref 3.87–5.11)
RDW: 16.1 % — ABNORMAL HIGH (ref 11.5–15.5)
WBC: 17.1 10*3/uL — ABNORMAL HIGH (ref 4.0–10.5)
nRBC: 0 % (ref 0.0–0.2)

## 2018-01-06 LAB — CULTURE, BLOOD (ROUTINE X 2)
CULTURE: NO GROWTH
Culture: NO GROWTH
Special Requests: ADEQUATE

## 2018-01-06 LAB — GLUCOSE, CAPILLARY
Glucose-Capillary: 165 mg/dL — ABNORMAL HIGH (ref 70–99)
Glucose-Capillary: 155 mg/dL — ABNORMAL HIGH (ref 70–99)
Glucose-Capillary: 151 mg/dL — ABNORMAL HIGH (ref 70–99)
Glucose-Capillary: 121 mg/dL — ABNORMAL HIGH (ref 70–99)
Glucose-Capillary: 148 mg/dL — ABNORMAL HIGH (ref 70–99)

## 2018-01-06 LAB — MAGNESIUM: Magnesium: 1.9 mg/dL (ref 1.7–2.4)

## 2018-01-06 LAB — MRSA PCR SCREENING: MRSA BY PCR: POSITIVE — AB

## 2018-01-06 LAB — PHOSPHORUS: PHOSPHORUS: 3.2 mg/dL (ref 2.5–4.6)

## 2018-01-06 MED ORDER — FUROSEMIDE 10 MG/ML IJ SOLN
40.0000 mg | Freq: Once | INTRAMUSCULAR | Status: AC
  Administered 2018-01-07: 40 mg via INTRAVENOUS
  Filled 2018-01-06: qty 4

## 2018-01-06 MED ORDER — POTASSIUM CHLORIDE 20 MEQ/15ML (10%) PO SOLN
40.0000 meq | Freq: Once | ORAL | Status: AC
  Administered 2018-01-06: 40 meq via ORAL
  Filled 2018-01-06: qty 30

## 2018-01-06 NOTE — Progress Notes (Signed)
NAME:  [name], MRN:  [id number], DOB:  August 10, 1927, LOS: 47 ADMISSION DATE:  12/05/2017, CONSULTATION DATE:  01/01/18 REFERRING MD:  Cruzita Lederer  CHIEF COMPLAINT:  Respiratory Distress   Brief History   [name] is a 84 y.o. female who was initially admitted 11/5 with aphasia.  Had imaging that was negative for CVA.  Has had complicated course with FTT, AoC combined heart failure, AoCKD, acute pulmonary edema, respiratory distress.  On 12/2, had recurrent episode of respiratory distress requiring intubation. Extubated on 12/6.  History of present illness   Pt is encephelopathic; therefore, this HPI is obtained from chart review. [name] is a 84 y.o. female who has a PMH as outlined below (see "past medical history").  She presented to [facility] 11/5 with aphasia.  On 11/5, son had noticed that she was slurring words; therefore, he brought her to the hospital for further evaluation.  She had CT and MRI which was negative for CVA.  She was found to have UTI and was started on Rocephin which was later changed to ampicillin and later zosyn.  Hospital course was complicated by failure to thrive.  Palliative care consulted 11/13 for goals of care.  Family opted for full code / full scope of care as feared mother would not receive appropriate treatment otherwise (Son is apparently a Copy).  Son requested neurology consultation on 11/20 for FTT as well as decreasing memory.  Neuro felt that her presentation was suggestive of a possible mixed underlying dementia process with cognitive worsening precipitated by intercurrent illness.  On 11/21, she had an episode of acute respiratory distress.  Was found to have acute pulmonary edema as well as elevated troponin felt to be due to NSTEMI.  She was treated with heparin, statin, beta-blocker, Plavix and Lasix.  An echocardiogram was obtained and demonstrated EF 30% with G2DD.  On 12/2, she had recurrent episode to the point that she was almost  agonal.  PCCM was called in consultation.  I had lengthy discussion with pt's daughter and son (Copy).  Son frustrated that pt not on nitro gtt, feels that pt has deteriorated "because of what we have done to her".  Of note, she apparently was recently diagnosed with achalasia after 50 lb weight loss over the past 1 year.  She had been evaluated by GI and had an appointment as an outpatient on 11/6.  Due to this, she has had trouble eating and maintaining weight; hence, the 50lb weight loss and FTT.  Past Medical History  HTN, combined heart failure, CAD, cardiomyopathy, CKD, DM II, FTT.  Significant Hospital Events   11/5 > admit. 11/10 > G tube placed by IR. 11/13 > PMT consulted. 11/20 > neuro consult.  Rapid response for respiratory distress, treated with lasix. 11/21 > cardiology consult for NSTEMI. 11/23 > rapid response for respiratory distress, treated with lasix and morphine. 11/27 > ENT consulted for stridor, flexible laryngoscopy negative. 12/1 > rapid response for respiratory distress, treated with morphine. 12/2 > PCCM consulted for agonal respirations, transferred to ICU for intubation. 12/3/>> Stable right atelectasis and effusion 01/05/2018 Extubated 12/7 Required BiPAP throughout the day  Consults:  Radiology. Palliative Care. Surgery. Neurology.  Signed off Cardiology.  Signed off ENT. PCCM. Nephrology.  Signed off  Procedures:  G tube 11/10 > ETT 12/2 >   Significant Diagnostic Tests:  CT head 11/5 > chronic ischemic disease. MRI brain 11/5 > no acute process. Abd [place] 11/19 > mod size periumbilical  hernia with solid density within.   Echo 11/21 > EF 30%, G2DD. Flex laryngoscopy 11/27 > negative. 01/03/2018 MRI of the brain with 3 subcentimeter infarcts in cerebellar region left  Micro Data:  Urine 11/5 > enterococcus faecalis. Blood 11/22 > negative. Urine 11/23 > enterococcus faecalis. Blood 12/2 > no growth to date>> Sputum 12/2 > Moderate  gram negative rods Klebsiella pneumoniae resistant to Zosyn  Antimicrobials:  Ceftriaxone 11/5 > 11/6. Ampicillin 11/6 > 11/8. Zosyn 11/24 > 11/29.  12/2 > 12/3 01/03/2018 meropenem>>  Interim history/subjective:  Extubated yesterday and tolerated nasal cannula however required BiPAP support overnight for WOB. No desaturations. Patient non-verbal  Objective:  Blood pressure 140/74, pulse (!) 111, temperature 99.3 F (37.4 C), temperature source Axillary, resp. rate 20, height 5\' 3"  (1.6 m), weight 59 kg, SpO2 100 %.    FiO2 (%):  [40 %-60 %] 40 %   Intake/Output Summary (Last 24 hours) at 01/06/2018 2229 Last data filed at 01/06/2018 1900 Gross per 24 hour  Intake 1539.47 ml  Output 500 ml  Net 1039.47 ml   Filed Weights   12/28/17 0452 12/29/17 0300 12/31/17 0600  Weight: 58.1 kg 58 kg 59 kg    Examination: General: Chronically ill appearing female, laying in bed, on BiPAP HEENT: EOMI, on BiPAP Neuro: Non-verbal, intermittently follows commands, moves extremities x 4 CV: RRR, no m/r/g PULM: Bibasilar crackles, no wheezing GI: BS+, soft, NTTP, PEG in place Extremities: 1+ LE edema, no cyanosis Skin: no rashes or lesions  Assessment & Plan:   Acute hypoxic respiratory failure - presumed due to acute pulmonary edema in the setting of AoC / decompensated combined heart failure.  MRI notes new 3 subcentimeter cerebellar infarcts  Plan   #Acute hypoxemic respiratory failure  #Klebsiella pneumonia ESBL #Concern for Aspiration Tolerating SBT this morning.  Mental status significantly improved, able to follow commands.  Net -3.8 L since admission.  Improving leukocytosis. --Extubated yesterday --Continue BiPAP nightly and PRN for work of breathing --Diurese with goal net even to negative --Continue meropenem.  Plan for total 7 days.  #Left temporal and frontal infarcts #Acute encephalopathy - unclear etiology.   MRI 16/01/958 with embolic-looking strokes.  Blood  cultures negative to date. TTE with no evidence of endocarditis. No seizure activity on EEG --Neurology following. Appreciate recommendations --Follow-up final blood cultures --Statin --Plavix  #Hypokalemia --Serial labs, replete as needed  #Enterococcus UTI Status post treatment with Zosyn  #AoC / decompensated combined heart failure  Echo from 11/21 with EF 30% and G2DD. Echo 12/6 with diffuse hypokinesis with EF 30%, mod-severe MR --Diuresis as tolerated --Continue Plavix but monitor for vaginal bleeding  #NSTEMI - s/p heparin gtt. --Glycerin patc --Cardiology signed on 01/03/2018  #AoCKD - improving --Nephrology is following not a candidate for hemodialysis --Continue diuresis --Avoid nephrotoxins  #DM II. --Sliding scale insulin protocol  #Dysphagia --Continue tube feedings via PEG  #AoC anemia.  Hemoglobin 8.5 on 01/05/2018 --Protocol transfuse --Daily CBC  #FTT  Per imaging and neuro, picture suggestive of underlying vascular dementia. Palliative care had been consulted and family opted for continued full scope of treatment  Best Practice:  Diet: TF's via PEG currently at goal Pain/Anxiety/Delirium protocol (if indicated): Intermittent fentanyl/versed. VAP protocol (if indicated): In place. DVT prophylaxis: SCD's. GI prophylaxis: Pantoprazole. Glucose control: SSI. Mobility: Bedrest. Code Status: Full. Family Communication: No family at bedside Disposition: Remain in ICU  Labs   CBC: Recent Labs  Lab 01/02/18 0340 01/03/18 0252 01/04/18 0602 01/05/18 0510 01/06/18  0637  WBC 15.1* 16.0* 18.3* 14.9* 17.1*  NEUTROABS  --   --  16.2* 12.3* 15.4*  HGB 8.4* 8.2* 8.1* 8.5* 8.7*  HCT 28.4* 28.1* 27.5* 28.4* 29.0*  MCV 94.4 96.6 96.5 95.9 96.7  PLT 288 277 264 212 096   Basic Metabolic Panel: Recent Labs  Lab 01/02/18 0340  01/03/18 0252 01/03/18 1920 01/04/18 0602 01/05/18 0510 01/06/18 0637  NA 144  --  144  --  143 144 146*  K 2.9*  --   3.7  --  3.1* 3.7 3.4*  CL 105  --  106  --  106 107 110  CO2 29  --  28  --  26 26 24   GLUCOSE 132*  --  101*  --  164* 124* 164*  BUN 47*  --  44*  --  41* 41* 42*  CREATININE 2.30*  --  2.11*  --  1.99* 1.91* 1.80*  CALCIUM 8.3*  --  8.2*  --  8.2* 8.2* 8.7*  MG 2.0   < > 2.0 2.0 1.9 2.0 1.9  PHOS 3.5   < > 3.6 3.6 3.5 2.9 3.2   < > = values in this interval not displayed.   GFR: Estimated Creatinine Clearance: 17.9 mL/min (A) (by C-G formula based on SCr of 1.8 mg/dL (H)). Recent Labs  Lab 12/31/17 2214 01/01/18 0434 01/01/18 0802 01/01/18 1851 01/02/18 0340 01/03/18 0252 01/04/18 0602 01/05/18 0510 01/06/18 0637  PROCALCITON  --   --   --  1.50 1.34 1.09  --   --   --   WBC  --  16.1*  --   --  15.1* 16.0* 18.3* 14.9* 17.1*  LATICACIDVEN 1.9 1.3 1.0  --   --   --   --   --   --    Liver Function Tests: Recent Labs  Lab 01/01/18 0434 01/03/18 0252  AST 18 17  ALT 17 12  ALKPHOS 47 48  BILITOT 0.9 0.9  PROT 6.3* 5.6*  ALBUMIN 1.7* 1.5*   No results for input(s): LIPASE, AMYLASE in the last 168 hours. No results for input(s): AMMONIA in the last 168 hours. ABG    Component Value Date/Time   PHART 7.482 (H) 01/03/2018 0420   PCO2ART 42.2 01/03/2018 0420   PO2ART 151.0 (H) 01/03/2018 0420   HCO3 31.5 (H) 01/03/2018 0420   TCO2 33 (H) 01/03/2018 0420   ACIDBASEDEF 2.8 (H) 12/20/2017 2235   O2SAT 99.0 01/03/2018 0420    Coagulation Profile: No results for input(s): INR, PROTIME in the last 168 hours. Cardiac Enzymes: Recent Labs  Lab 01/01/18 0434 01/01/18 1851 01/03/18 0252  TROPONINI 1.03* 0.94* 0.66*   CBG: Recent Labs  Lab 01/06/18 0437 01/06/18 0844 01/06/18 1155 01/06/18 1618 01/06/18 2026  GLUCAP 165* 155* 151* 121* 148*    The patient is critically ill with multiple organ systems failure and requires high complexity decision making for assessment and support, frequent evaluation and titration of therapies, application of advanced  monitoring technologies and extensive interpretation of multiple databases.   Critical Care Time devoted to patient care services described in this note is  31 Minutes. This time reflects time of care of this signee Dr. Rodman Pickle. This critical care time does not reflect procedure time, or teaching time or supervisory time of PA/NP/Med student/Med Resident etc but could involve care discussion time.  Rodman Pickle, M.D. [facility] Pulmonary/Critical Care Medicine. Pager: [phone]. After hours pager: [phone].

## 2018-01-06 NOTE — Progress Notes (Signed)
RT NOTE:  Pt placed on BIPAP for increase WOB, RR 40 and accessory muscle use. Spo2 92% on RA. Pt is more comfortable with BIPAP. RT will monitor.

## 2018-01-06 NOTE — Evaluation (Signed)
Physical Therapy Evaluation Patient Details Name: [name] MRN: [id number] DOB: 12/24/1927 Today's Date: 01/06/2018   History of Present Illness  84 yo female admitted on 12/05/17 with onset of UTI and AMS was admitted with ongoing weight loss and dehydration with FTT.  S/p G-tube placement on 12/10/17 with ongoing confusion.  Dx with acute respiratory failure with hypoxia due to pulmonary edema, NSTEMI possibly due to volume overload 11/21, acute HF, acute metabolic encepholopathy (MRI negative for acute infarcts), aspiration PNA 11/23, anemia (acute on chronic), sacral pressure wound.  Respiratory distress on 11/25 with pt combative vs. lethargy.  VDRF 12/2-12/6.  On Bipap on 12/7.  PMHx:  CAD, AKI, angina, cardiomegaly, CABG, CVA with L hemi, CKD 3, transfusion, pleural effusion, DM, achalasia with botox procedure.   Clinical Impression  Pt admitted with above diagnosis. Pt currently with functional limitations due to the deficits listed below (see PT Problem List). Pt on Bipap  With nurse in agreement for PT to attempt evaluation.  Pt total assist to come to EOB and max assist to total assist to sit EOB x 5 minutes.  Pt anxious and when she does attempt to support herself, pt moves body impulsively needing incr support and assist.  Pt did follow commands inconsistently.  Fatigues quickly.  Will need SNF.  Pt will benefit from skilled PT to increase their independence and safety with mobility to allow discharge to the venue listed below.      Follow Up Recommendations SNF;Supervision/Assistance - 24 hour    Equipment Recommendations  Hospital bed;Other (comment)(hoyer lift)    Recommendations for Other Services       Precautions / Restrictions Precautions Precautions: Fall Precaution Comments: coccygeal wound, Prevalon boots/ Peg 11/10 with abdominal binder, geo mat Restrictions Weight Bearing Restrictions: No      Mobility  Bed Mobility Overal bed mobility: Needs Assistance Bed  Mobility: Supine to Sit;Sit to Supine Rolling: Total assist;+2 for physical assistance Sidelying to sit: Total assist;+2 for physical assistance Supine to sit: +2 for physical assistance;Total assist Sit to supine: Total assist;+2 for physical assistance   General bed mobility comments: total assist to come to eOB intitiating movement of legs and trunk. Pt not resisting, but also no helping much.    Transfers                    Ambulation/Gait                Stairs            Wheelchair Mobility    Modified Rankin (Stroke Patients Only)       Balance Overall balance assessment: Needs assistance Sitting-balance support: Feet supported;Bilateral upper extremity supported Sitting balance-Leahy Scale: Zero Sitting balance - Comments: max to total assist to sit EOB.  Pt able to sit up supported and then flops back anteriorly and posteriorly needing incr support.  Sat a total of 5 minutes.  Pt followed a few commands to move UE and LEs with delayed response. Postural control: Posterior lean;Right lateral lean                                   Pertinent Vitals/Pain Pain Assessment: No/denies pain    Home Living Family/patient expects to be discharged to:: Skilled nursing facility Living Arrangements: Children Available Help at Discharge: Available PRN/intermittently Type of Home: House Home Access: Stairs to enter Entrance Stairs-Rails: None Entrance Stairs-Number of  Steps: 2 Home Layout: Multi-level Home Equipment: Walker - 2 wheels;Shower seat;Wheelchair - manual Additional Comments: son from [place] is present     Prior Function Level of Independence: Needs assistance   Gait / Transfers Assistance Needed: RW with no assist in recent history per chart  ADL's / Homemaking Assistance Needed: living in her own home recently and performing ADLs. Family hired someone for IADLs  Comments: amb with RW in November     Hand Dominance   Dominant  Hand: Right    Extremity/Trunk Assessment   Upper Extremity Assessment Upper Extremity Assessment: Defer to OT evaluation    Lower Extremity Assessment Lower Extremity Assessment: RLE deficits/detail;LLE deficits/detail RLE Deficits / Details: grossly 2-/5 LLE Deficits / Details: grossly 2-/5    Cervical / Trunk Assessment Cervical / Trunk Assessment: Kyphotic  Communication   Communication: Other (comment)(Only nodding for yes/no questions. )  Cognition Arousal/Alertness: Awake/alert Behavior During Therapy: Restless Overall Cognitive Status: Impaired/Different from baseline Area of Impairment: Orientation;Attention;Memory;Following commands;Safety/judgement;Awareness;Problem solving                 Orientation Level: Disoriented to;Person;Place;Time;Situation Current Attention Level: Focused Memory: Decreased recall of precautions;Decreased short-term memory Following Commands: Follows one step commands inconsistently;Follows one step commands with increased time Safety/Judgement: Decreased awareness of safety;Decreased awareness of deficits Awareness: Intellectual Problem Solving: Decreased initiation;Difficulty sequencing;Requires verbal cues;Requires tactile cues General Comments: Pt arousable,  restless in the bed once aroused, in mittens.  Able to sit EOB with mod to max assist.      General Comments      Exercises General Exercises - Lower Extremity Long Arc Quad: AAROM;Both;5 reps;Seated   Assessment/Plan    PT Assessment Patient needs continued PT services  PT Problem List Decreased strength;Decreased range of motion;Decreased activity tolerance;Decreased balance;Decreased mobility;Decreased coordination;Decreased cognition;Decreased knowledge of use of DME;Decreased safety awareness;Cardiopulmonary status limiting activity;Decreased skin integrity;Pain       PT Treatment Interventions DME instruction;Functional mobility training;Therapeutic  activities;Therapeutic exercise;Balance training;Neuromuscular re-education;Patient/family education    PT Goals (Current goals can be found in the Care Plan section)  Acute Rehab PT Goals Patient Stated Goal: unable to state, not making much sense PT Goal Formulation: Patient unable to participate in goal setting Time For Goal Achievement: 01/20/18 Potential to Achieve Goals: Fair    Frequency Min 2X/week   Barriers to discharge Decreased caregiver support      Co-evaluation               AM-PAC PT "6 Clicks" Mobility  Outcome Measure Help needed turning from your back to your side while in a flat bed without using bedrails?: Total Help needed moving from lying on your back to sitting on the side of a flat bed without using bedrails?: Total Help needed moving to and from a bed to a chair (including a wheelchair)?: Total Help needed standing up from a chair using your arms (e.g., wheelchair or bedside chair)?: Total Help needed to walk in hospital room?: Total Help needed climbing 3-5 steps with a railing? : Total 6 Click Score: 6    End of Session Equipment Utilized During Treatment: Oxygen;Gait belt(bipap) Activity Tolerance: Other (comment);Patient limited by fatigue;Patient limited by lethargy(limited by cognition/restlessness) Patient left: in bed;with call bell/phone within reach;with restraints reapplied;with SCD's reapplied Nurse Communication: Mobility status;Need for lift equipment PT Visit Diagnosis: Muscle weakness (generalized) (M62.81);Other abnormalities of gait and mobility (R26.89);Adult, failure to thrive (R62.7);Hemiplegia and hemiparesis Hemiplegia - Right/Left: Left Hemiplegia - dominant/non-dominant: Non-dominant Hemiplegia - caused by: Unspecified  Time: 2449-7530 PT Time Calculation (min) (ACUTE ONLY): 17 min   Charges:   PT Evaluation $PT Eval Moderate Complexity: 1 Mod          Katie Faraone,PT Acute Rehabilitation Services Pager:   [phone]  Office:  [place] 01/06/2018, 1:05 PM

## 2018-01-06 NOTE — Progress Notes (Signed)
RN noticed sanguineous discharge from pt's vaginal area during pericare. Morning H&H labs stable. CCM MD notified. No new orders received at this time. RN will continue to assess and monitor.

## 2018-01-06 NOTE — Progress Notes (Signed)
STROKE TEAM PROGRESS NOTE   SUBJECTIVE (INTERVAL HISTORY) Her son is at the bedside.  Overall her condition is gradually improving.  She was extubated yesterday and is presently on BiPAP but remains confused.  OBJECTIVE Temp:  [97.6 F (36.4 C)-99.3 F (37.4 C)] 99.1 F (37.3 C) (12/07 1156) Pulse Rate:  [79-120] 101 (12/07 1300) Cardiac Rhythm: Normal sinus rhythm (12/07 1200) Resp:  [10-33] 29 (12/07 1300) BP: (132-160)/(55-95) 142/81 (12/07 1300) SpO2:  [91 %-100 %] 100 % (12/07 1300) FiO2 (%):  [40 %-60 %] 40 % (12/07 0851)  Recent Labs  Lab 01/05/18 1914 01/05/18 2342 01/06/18 0437 01/06/18 0844 01/06/18 1155  GLUCAP 86 96 165* 155* 151*   Recent Labs  Lab 01/02/18 0340  01/03/18 0252 01/03/18 1920 01/04/18 0602 01/05/18 0510 01/06/18 0637  NA 144  --  144  --  143 144 146*  K 2.9*  --  3.7  --  3.1* 3.7 3.4*  CL 105  --  106  --  106 107 110  CO2 29  --  28  --  26 26 24   GLUCOSE 132*  --  101*  --  164* 124* 164*  BUN 47*  --  44*  --  41* 41* 42*  CREATININE 2.30*  --  2.11*  --  1.99* 1.91* 1.80*  CALCIUM 8.3*  --  8.2*  --  8.2* 8.2* 8.7*  MG 2.0   < > 2.0 2.0 1.9 2.0 1.9  PHOS 3.5   < > 3.6 3.6 3.5 2.9 3.2   < > = values in this interval not displayed.   Recent Labs  Lab 01/01/18 0434 01/03/18 0252  AST 18 17  ALT 17 12  ALKPHOS 47 48  BILITOT 0.9 0.9  PROT 6.3* 5.6*  ALBUMIN 1.7* 1.5*   Recent Labs  Lab 01/02/18 0340 01/03/18 0252 01/04/18 0602 01/05/18 0510 01/06/18 0637  WBC 15.1* 16.0* 18.3* 14.9* 17.1*  NEUTROABS  --   --  16.2* 12.3* 15.4*  HGB 8.4* 8.2* 8.1* 8.5* 8.7*  HCT 28.4* 28.1* 27.5* 28.4* 29.0*  MCV 94.4 96.6 96.5 95.9 96.7  PLT 288 277 264 212 283   Recent Labs  Lab 01/01/18 0434 01/01/18 1851 01/03/18 0252  TROPONINI 1.03* 0.94* 0.66*   No results for input(s): LABPROT, INR in the last 72 hours. No results for input(s): COLORURINE, LABSPEC, [place], GLUCOSEU, HGBUR, BILIRUBINUR, KETONESUR, PROTEINUR,  UROBILINOGEN, NITRITE, LEUKOCYTESUR in the last 72 hours.  Invalid input(s): APPERANCEUR     Component Value Date/Time   CHOL 73 01/05/2018 0510   TRIG 60 01/05/2018 0510   HDL 21 (L) 01/05/2018 0510   CHOLHDL 3.5 01/05/2018 0510   VLDL 12 01/05/2018 0510   LDLCALC 40 01/05/2018 0510   Lab Results  Component Value Date   HGBA1C 5.9 (H) 12/24/2017      Component Value Date/Time   LABOPIA NONE DETECTED 09/20/2016 1530   COCAINSCRNUR NONE DETECTED 09/20/2016 1530   LABBENZ NONE DETECTED 09/20/2016 1530   AMPHETMU NONE DETECTED 09/20/2016 1530   THCU NONE DETECTED 09/20/2016 1530   LABBARB NONE DETECTED 09/20/2016 1530    No results for input(s): ETH in the last 168 hours.  I have personally reviewed the radiological images below and agree with the radiology interpretations.  Mr Brain Wo Contrast  Result Date: 01/03/2018 CLINICAL DATA:  Stroke follow-up. EXAM: MRI HEAD WITHOUT CONTRAST TECHNIQUE: Multiplanar, multiecho pulse sequences of the brain and surrounding structures were obtained without intravenous contrast. COMPARISON:  12/05/2017 FINDINGS: Brain: There are 3 subcentimeter foci of restricted diffusion compatible with acute infarcts involving posterior left temporal lobe cortex, left postcentral gyrus, and cortex or subcortical white matter of the left middle frontal gyrus. A subcentimeter focus of diffusion abnormality in the splenium of the corpus callosum has also been present on prior studies and, while more conspicuous today, is favored to be artifactual rather than reflecting an acute infarct. No intracranial hemorrhage, mass, midline shift, or extra-axial fluid collection is identified. There is moderate cerebral atrophy. Extensive T2 hyperintensities throughout the cerebral white matter and brainstem are unchanged and nonspecific but compatible with severe chronic small vessel ischemic disease. Chronic lacunar infarcts are again noted in the basal ganglia and thalami.  There is also a small chronic cortical infarct in the right precentral gyrus. Vascular: Major intracranial vascular flow voids are preserved. Skull and upper cervical spine: Diffusely heterogeneous bone marrow signal without suspicious focal lesion identified. Sinuses/Orbits: Bilateral cataract extraction. Clear paranasal sinuses. Trace left mastoid effusion. Other: None. IMPRESSION: 1. Three subcentimeter acute infarcts in the left cerebral hemisphere. 2. Severe chronic small vessel ischemic disease. Electronically Signed   By: Logan Bores M.D.   On: 01/03/2018 15:07   US Abdomen Limited  Result Date: 12/19/2017 CLINICAL DATA:  Periumbilical nodule. EXAM: ULTRASOUND ABDOMEN LIMITED COMPARISON:  CT scan of November 04, 2017. FINDINGS: Limited sonographic evaluation of the periumbilical region demonstrates moderate size hernia. There is solid density within the hernia which may represent bowel loops, although no peristalsis is observed. The possibility of this representing enlarged lymph node or other neoplasm cannot be excluded. IMPRESSION: Moderate size periumbilical hernia is noted. Solid density is noted within the hernia which may represent bowel loop, but enlarged lymph node or neoplasm cannot be excluded. Clinical correlation is recommended. Electronically Signed   By: Marijo Conception, M.D.   On: 12/19/2017 12:44   TTE 12/21/17 - Normal LV size with mild LV hypertrophy. EF 30%, diffuse   hypokinesis. Moderate diastolic dysfunction. Normal RV size with   mildly decreased systolic function. Mild to moderate MR.     PHYSICAL EXAM  Temp:  [97.6 F (36.4 C)-99.3 F (37.4 C)] 99.1 F (37.3 C) (12/07 1156) Pulse Rate:  [79-120] 101 (12/07 1300) Resp:  [10-33] 29 (12/07 1300) BP: (132-160)/(55-95) 142/81 (12/07 1300) SpO2:  [91 %-100 %] 100 % (12/07 1300) FiO2 (%):  [40 %-60 %] 40 % (12/07 0851)  General - frail and malnourished-looking elderly African-American lady. She is in mild respiratory  distress and is on BiPAP. Ophthalmologic - fundi not visualized due to noncooperation.  Cardiovascular - Regular rate and rhythm.  Neuro - awake, eyes open, Confused and disoriented. Dysarthric speech and can be barely understood. . Followed commands to close eyes, showing two fingers and thrumb on the right hand, but did not follow other simple commands. PERRL, EOMI, no gaze preference, not consistently blinking to visual threat bilaterally but tracking to objects in both visual fields. Face is symmetric tongue protrusion not able to cooperate. Moving all extremities symmetrically with prompt and pain stimulation. DTR 1+ and b/l babinski. Sensation, coordination and gait not tested.    ASSESSMENT/PLAN [name] is a 84 y.o. female with history of HTN, HLD, DM, CKD, cardiomyopathy, CAD s/p CABG, stroke admitted on 12/05/17 for failure to thrive. Found to have achalasia with weight loss and was treated with botox. Pt was also found to have episode of aphasia, neurology consulted and MRI was negative, considered encephalopathy given multiple  medical issues at that time. Pt eventually received PEG tube for nutrition. However, her admission was complicated by pulmonary edema, CHF with EF 30%, aspiration and was treated with zosyn and vanco, NSTEMI with elevated troponin and now off heparin due to anemia required transfusion, UTI. she had further respiratory failure on 01/01/18 and was intubated and transferred to 4N with CCM management. Sputum culture showed Klebsiella and now on meropenum. MRI brain showed 3 punctate infarcts at left temporal and frontal lobes. Neurology was called back for recommendations.    Incidental stroke - 3 punctate left temproal and left frontal infarcts, etiology unclear, could be due to cardioembolic with occult PAF or cardiomyopathy with low EF, paradoxical emboli, or hypercoagulable state with FTT and sepsis - however, this will not explain pt encephalopathy  MRI  punctate left temproal and left frontal infarcts  MRA hold off for now given agitation and not change management   Carotid Doppler  was nondiagnostic due to patient lack of cooperation.  2D Echo  12/21/17 EF 30%  TTE repeat diffuse hypokinesis worse in the inferior wall. Concentric hypertrophy. Ejection fraction 30%.  LE venous doppler :Right lower extremity no DVT. Left side could not be studied as patient refused exam  .CT abd/pelvis 10/2017 no malignancy   LDL 40  HgbA1c 5.9  SCDs for VTE prophylaxis  No antithrombotic prior to admission, now on clopidogrel 75 mg daily.   Ongoing aggressive stroke risk factor management  Therapy recommendations: pending    Disposition:  Pending   Encephalopathy due to multiple medical conditions  Multifactorial   FTT  Pulmonary edema  CHF with low EF  Anemia   UTI and aspiration pneumonia  NSTEMI with elevated troponin  Respiratory failure  Intubated on 01/01/18  Plan to extubate today  Aspiration pneumonia with sputum culture Klebsiella  On meropenum  CCM on board  History of stroke  08/2016 left sided weakness and numbness, MRI right thalamic and IC infarct. MRA head and neck neg. EF 55-60%, LDL 101 and A1C 7.7 - put on ASA and plavix and lipitor 40 and discharged to CIR  01/2017 followed with [facility] for fall and left leg injury  Diabetes  HgbA1c 5.9 goal < 7.0  Controlled  On levemir  CBG monitoring  SSI  close PCP follow up  Hypertension . Stable  BP goal normotensive  CCM on board  Hyperlipidemia  Home meds:  lipitor 40  LDL 40, goal < 70  Now on lipitor 40  Continue statin at discharge  CKD stage III  Cre 2.11->1.99->1.91  On IVF  Other Stroke Risk Factors  Advanced age  CHF with low EF  CAD s/p CABG  Other Active Problems  Leukocytosis WBC 18.3->14.9  Anemia due to CKD - Hb 8.1->8.5  Hospital day # 28 I had a long discussion with the patient's son at the bedside and  with Dr. Loanne Drilling critical care medicine.I agree she has multifactorial encephalopathy which should gradually improve and the small strokes that she has a likely from small vessel disease and are not significantly contributing to her medical presentation Irecommend continue weaning off respiratory support and transfer to rehabilitation when medically stable. I do not believe further stroke related testing in the form of TEE will be beneficial given her poor general medical condition. [name] son is in agreement. This patient is critically ill due to FTT, sepsis, UTI, aspiration pneumonia, respiratory failure, stroke, NSTEMI and at significant risk of neurological worsening, death form recurrent stroke, heart failure,  seizure and sepsis. This patient's care requires constant monitoring of vital signs, hemodynamics, respiratory and cardiac monitoring, review of multiple databases, neurological assessment, discussion with family, other specialists and medical decision making of high complexity. I spent 1minutes of neurocritical care time in the care of this patient.   Antony Contras, MD Stroke Neurology 01/06/2018 3:03 PM    To contact Stroke Continuity provider, please refer to [email or web address] After hours, contact General Neurology

## 2018-01-07 ENCOUNTER — Inpatient Hospital Stay (HOSPITAL_COMMUNITY): Payer: Medicare Other

## 2018-01-07 LAB — BASIC METABOLIC PANEL
Anion gap: 10 (ref 5–15)
BUN: 51 mg/dL — ABNORMAL HIGH (ref 8–23)
CO2: 25 mmol/L (ref 22–32)
Calcium: 8.9 mg/dL (ref 8.9–10.3)
Chloride: 112 mmol/L — ABNORMAL HIGH (ref 98–111)
Creatinine, Ser: 1.6 mg/dL — ABNORMAL HIGH (ref 0.44–1.00)
GFR calc Af Amer: 33 mL/min — ABNORMAL LOW (ref >60)
GFR calc non Af Amer: 28 mL/min — ABNORMAL LOW (ref >60)
Glucose, Bld: 191 mg/dL — ABNORMAL HIGH (ref 70–99)
Potassium: 3.5 mmol/L (ref 3.5–5.1)
Sodium: 147 mmol/L — ABNORMAL HIGH (ref 135–145)

## 2018-01-07 LAB — CBC WITH DIFFERENTIAL/PLATELET
Abs Immature Granulocytes: 0.08 10*3/uL — ABNORMAL HIGH (ref 0.00–0.07)
Basophils Absolute: 0.1 10*3/uL (ref 0.0–0.1)
Basophils Relative: 1 %
EOS ABS: 0.1 10*3/uL (ref 0.0–0.5)
Eosinophils Relative: 1 %
HEMATOCRIT: 26.9 % — AB (ref 36.0–46.0)
Hemoglobin: 7.9 g/dL — ABNORMAL LOW (ref 12.0–15.0)
Immature Granulocytes: 1 %
LYMPHS ABS: 1 10*3/uL (ref 0.7–4.0)
Lymphocytes Relative: 8 %
MCH: 28.8 pg (ref 26.0–34.0)
MCHC: 29.4 g/dL — ABNORMAL LOW (ref 30.0–36.0)
MCV: 98.2 fL (ref 80.0–100.0)
Monocytes Absolute: 0.8 10*3/uL (ref 0.1–1.0)
Monocytes Relative: 6 %
Neutro Abs: 10.7 10*3/uL — ABNORMAL HIGH (ref 1.7–7.7)
Neutrophils Relative %: 83 %
Platelets: 231 10*3/uL (ref 150–400)
RBC: 2.74 MIL/uL — ABNORMAL LOW (ref 3.87–5.11)
RDW: 16 % — ABNORMAL HIGH (ref 11.5–15.5)
WBC: 12.8 10*3/uL — ABNORMAL HIGH (ref 4.0–10.5)
nRBC: 0 % (ref 0.0–0.2)

## 2018-01-07 LAB — GLUCOSE, CAPILLARY
Glucose-Capillary: 101 mg/dL — ABNORMAL HIGH (ref 70–99)
Glucose-Capillary: 136 mg/dL — ABNORMAL HIGH (ref 70–99)
Glucose-Capillary: 151 mg/dL — ABNORMAL HIGH (ref 70–99)
Glucose-Capillary: 164 mg/dL — ABNORMAL HIGH (ref 70–99)
Glucose-Capillary: 169 mg/dL — ABNORMAL HIGH (ref 70–99)
Glucose-Capillary: 177 mg/dL — ABNORMAL HIGH (ref 70–99)
Glucose-Capillary: 179 mg/dL — ABNORMAL HIGH (ref 70–99)
Glucose-Capillary: 54 mg/dL — ABNORMAL LOW (ref 70–99)
Glucose-Capillary: 62 mg/dL — ABNORMAL LOW (ref 70–99)

## 2018-01-07 LAB — BRAIN NATRIURETIC PEPTIDE: B Natriuretic Peptide: >4500 pg/mL — ABNORMAL HIGH (ref 0.0–100.0)

## 2018-01-07 MED ORDER — DEXTROSE 50 % IV SOLN
INTRAVENOUS | Status: AC
  Filled 2018-01-07: qty 50

## 2018-01-07 MED ORDER — DEXTROSE 50 % IV SOLN
INTRAVENOUS | Status: AC
  Administered 2018-01-07: 25 mL
  Filled 2018-01-07: qty 50

## 2018-01-07 MED ORDER — ALTEPLASE 2 MG IJ SOLR
2.0000 mg | Freq: Once | INTRAMUSCULAR | Status: AC
  Administered 2018-01-07: 2 mg
  Filled 2018-01-07: qty 2

## 2018-01-07 MED ORDER — WHITE PETROLATUM EX OINT
TOPICAL_OINTMENT | CUTANEOUS | Status: AC
  Filled 2018-01-07: qty 28.35

## 2018-01-07 MED ORDER — FUROSEMIDE 10 MG/ML IJ SOLN
40.0000 mg | Freq: Once | INTRAMUSCULAR | Status: AC
  Administered 2018-01-07: 40 mg via INTRAVENOUS
  Filled 2018-01-07: qty 4

## 2018-01-07 NOTE — Progress Notes (Signed)
Notified by lab at 1715 that BMP and CBC collected & sent at 0849 was never received; informed that they would call back if new specimen was needed. New specimen drawn and resent at 1820.

## 2018-01-07 NOTE — Progress Notes (Signed)
NAME:  [name], MRN:  [id number], DOB:  1927/04/03, LOS: 41 ADMISSION DATE:  12/05/2017, CONSULTATION DATE:  01/01/18 REFERRING MD:  Cruzita Lederer  CHIEF COMPLAINT:  Respiratory Distress   Brief History   [name] is a 84 y.o. female who was initially admitted 11/5 with aphasia.  Had imaging that was negative for CVA.  Has had complicated course with FTT, AoC combined heart failure, AoCKD, acute pulmonary edema, respiratory distress.  On 12/2, had recurrent episode of respiratory distress requiring intubation. Extubated on 12/6.   Past Medical History  HTN, combined heart failure, CAD, cardiomyopathy, CKD, DM II, FTT.  Significant Hospital Events   11/5 > admit. 11/10 > G tube placed by IR. 11/13 > PMT consulted. 11/20 > neuro consult.  Rapid response for respiratory distress, treated with lasix. 11/21 > cardiology consult for NSTEMI. 11/23 > rapid response for respiratory distress, treated with lasix and morphine. 11/27 > ENT consulted for stridor, flexible laryngoscopy negative. 12/1 > rapid response for respiratory distress, treated with morphine. 12/2 > PCCM consulted for agonal respirations, transferred to ICU for intubation. 12/3/>> Stable right atelectasis and effusion 01/05/2018 Extubated 12/7 Required BiPAP throughout the day  Consults:  Radiology. Palliative Care. Surgery. Neurology.  Signed off Cardiology.  Signed off ENT. PCCM. Nephrology.  Signed off  Procedures:  G tube 11/10 > ETT 12/2 > 12/6  Significant Diagnostic Tests:  CT head 11/5 > chronic ischemic disease. MRI brain 11/5 > no acute process. Abd [place] 11/19 > mod size periumbilical hernia with solid density within.   Echo 11/21 > EF 30%, G2DD. Flex laryngoscopy 11/27 > negative. 01/03/2018 MRI of the brain with 3 subcentimeter infarcts in cerebellar region left  Micro Data:  Urine 11/5 > enterococcus faecalis. Blood 11/22 > negative. Urine 11/23 > enterococcus faecalis. Blood 12/2 > no growth to  date>>NEG Sputum 12/2 > Moderate gram negative rods>> Klebsiella pneumoniae resistant to Zosyn  Antimicrobials:  Ceftriaxone 11/5 > 11/6. Ampicillin 11/6 > 11/8. Zosyn 11/24 > 11/29.  12/2 > 12/3 01/03/2018 meropenem>>  Interim history/subjective:  Extubated 12/6, requiring bipap intermittently since.  Off bipap most of the day today.   Objective:  Blood pressure 135/77, pulse (!) 101, temperature 98.2 F (36.8 C), temperature source Oral, resp. rate 20, height 5\' 3"  (1.6 m), weight 59 kg, SpO2 100 %.    FiO2 (%):  [40 %] 40 %   Intake/Output Summary (Last 24 hours) at 01/07/2018 1511 Last data filed at 01/07/2018 1500 Gross per 24 hour  Intake 1354.24 ml  Output 1650 ml  Net -295.76 ml   Filed Weights   12/28/17 0452 12/29/17 0300 12/31/17 0600  Weight: 58.1 kg 58 kg 59 kg    Examination: General: chronically ill appearing female, NAD in bed on [place]  HEENT: mm moist, no JVD  Neuro: Non-verbal at baseline, follows some commands, MAE  CV: RRR, no m/r/g PULM: resps even non labored on [place], few Bibasilar crackles, no wheezing GI: BS+, soft, NTTP, PEG in place Extremities: 1+ LE edema, no cyanosis Skin: no rashes or lesions  Assessment & Plan:    #Acute hypoxemic respiratory failure - presumed r/t pulmonary edema in setting acute on chronic combined heart failure as well as klebsiella PNA +/- aspiration. Extubated 12/6. Intermittent bipap needs.  #Klebsiella pneumonia ESBL #Concern for Aspiration PLAN -  qhs bipap for now as well as PRN  Diuresis as BP and Scr tolerate  Continue abx - D 5/7 meropenem  Pulmonary hygiene  F/u CXR   #  Left temporal and frontal infarcts #Acute encephalopathy - unclear etiology.   MRI 38/03/5051 with embolic-looking strokes.  Blood cultures negative. TTE with no evidence of endocarditis. No seizure activity on EEG PLAN -  Neuro following  Statin, plavix   #Hypokalemia --Serial labs, replete as needed  #Enterococcus UTI Status post  treatment with Zosyn  #AoC / decompensated combined heart failure  Echo from 11/21 with EF 30% and G2DD. Echo 12/6 with diffuse hypokinesis with EF 30%, mod-severe MR PLAN -  Continue diuresis as tol  plavix - monitor for bleeding   #NSTEMI - s/p heparin gtt. --Glycerin patch --Cardiology signed off 01/03/2018  #AoCKD - improving PLAN -  Renal following - not candidate for HD  Diuresis as above  F/u chem   #DM II. PLAN -  SSI   #Dysphagia PLAN -  TF via PEG  --Continue tube feedings via PEG  #AoC anemia.  Hgb stable.  PLAN -  Monitor on plavix  F/u cbc    #FTT  Per imaging and neuro, picture suggestive of underlying vascular dementia. Palliative care had been consulted and family opted for continued full scope of treatment.  Poor long term prognosis.   Best Practice:  Diet: TF's via PEG currently at goal Pain/Anxiety/Delirium protocol (if indicated): Intermittent fentanyl/versed. VAP protocol (if indicated): In place. DVT prophylaxis: SCD's. GI prophylaxis: Pantoprazole. Glucose control: SSI. Mobility: Bedrest. Code Status: Full. Family Communication: No family at bedside Disposition: Remain in ICU  Labs   CBC: Recent Labs  Lab 01/02/18 0340 01/03/18 0252 01/04/18 0602 01/05/18 0510 01/06/18 0637  WBC 15.1* 16.0* 18.3* 14.9* 17.1*  NEUTROABS  --   --  16.2* 12.3* 15.4*  HGB 8.4* 8.2* 8.1* 8.5* 8.7*  HCT 28.4* 28.1* 27.5* 28.4* 29.0*  MCV 94.4 96.6 96.5 95.9 96.7  PLT 288 277 264 212 976   Basic Metabolic Panel: Recent Labs  Lab 01/02/18 0340  01/03/18 0252 01/03/18 1920 01/04/18 0602 01/05/18 0510 01/06/18 0637  NA 144  --  144  --  143 144 146*  K 2.9*  --  3.7  --  3.1* 3.7 3.4*  CL 105  --  106  --  106 107 110  CO2 29  --  28  --  26 26 24   GLUCOSE 132*  --  101*  --  164* 124* 164*  BUN 47*  --  44*  --  41* 41* 42*  CREATININE 2.30*  --  2.11*  --  1.99* 1.91* 1.80*  CALCIUM 8.3*  --  8.2*  --  8.2* 8.2* 8.7*  MG 2.0   < > 2.0  2.0 1.9 2.0 1.9  PHOS 3.5   < > 3.6 3.6 3.5 2.9 3.2   < > = values in this interval not displayed.   GFR: Estimated Creatinine Clearance: 17.9 mL/min (A) (by C-G formula based on SCr of 1.8 mg/dL (H)). Recent Labs  Lab 12/31/17 2214  01/01/18 0434 01/01/18 0802 01/01/18 1851 01/02/18 0340 01/03/18 0252 01/04/18 0602 01/05/18 0510 01/06/18 0637  PROCALCITON  --   --   --   --  1.50 1.34 1.09  --   --   --   WBC  --    < > 16.1*  --   --  15.1* 16.0* 18.3* 14.9* 17.1*  LATICACIDVEN 1.9  --  1.3 1.0  --   --   --   --   --   --    < > = values in  this interval not displayed.   Liver Function Tests: Recent Labs  Lab 01/01/18 0434 01/03/18 0252  AST 18 17  ALT 17 12  ALKPHOS 47 48  BILITOT 0.9 0.9  PROT 6.3* 5.6*  ALBUMIN 1.7* 1.5*   No results for input(s): LIPASE, AMYLASE in the last 168 hours. No results for input(s): AMMONIA in the last 168 hours. ABG    Component Value Date/Time   PHART 7.482 (H) 01/03/2018 0420   PCO2ART 42.2 01/03/2018 0420   PO2ART 151.0 (H) 01/03/2018 0420   HCO3 31.5 (H) 01/03/2018 0420   TCO2 33 (H) 01/03/2018 0420   ACIDBASEDEF 2.8 (H) 12/20/2017 2235   O2SAT 99.0 01/03/2018 0420    Coagulation Profile: No results for input(s): INR, PROTIME in the last 168 hours. Cardiac Enzymes: Recent Labs  Lab 01/01/18 0434 01/01/18 1851 01/03/18 0252  TROPONINI 1.03* 0.94* 0.66*   CBG: Recent Labs  Lab 01/07/18 0333 01/07/18 0831 01/07/18 0834 01/07/18 0914 01/07/18 1222  GLUCAP 151* 54* 62* 177* 179*    Nickolas Madrid, NP 01/07/2018  3:11 PM Pager: [phone] or [phone]

## 2018-01-07 NOTE — Progress Notes (Signed)
STROKE TEAM PROGRESS NOTE   SUBJECTIVE (INTERVAL HISTORY) Her RNis at the bedside.  Overall her condition is unchanged. She was able to tolerate being off the BiPAP only for 3 hours yesterday and was put back on it due to respiratory distress. She is resting comfortably right now on BiPAP.   She remains confused and follows occasional commands OBJECTIVE Temp:  [98.1 F (36.7 C)-99.3 F (37.4 C)] 98.2 F (36.8 C) (12/08 1200) Pulse Rate:  [77-118] 96 (12/08 1300) Cardiac Rhythm: Normal sinus rhythm (12/08 1200) Resp:  [11-35] 19 (12/08 1300) BP: (97-150)/(45-87) 150/78 (12/08 1300) SpO2:  [100 %] 100 % (12/08 1300) FiO2 (%):  [40 %] 40 % (12/07 2013)  Recent Labs  Lab 01/07/18 0333 01/07/18 0831 01/07/18 0834 01/07/18 0914 01/07/18 1222  GLUCAP 151* 54* 62* 177* 179*   Recent Labs  Lab 01/02/18 0340  01/03/18 0252 01/03/18 1920 01/04/18 0602 01/05/18 0510 01/06/18 0637  NA 144  --  144  --  143 144 146*  K 2.9*  --  3.7  --  3.1* 3.7 3.4*  CL 105  --  106  --  106 107 110  CO2 29  --  28  --  26 26 24   GLUCOSE 132*  --  101*  --  164* 124* 164*  BUN 47*  --  44*  --  41* 41* 42*  CREATININE 2.30*  --  2.11*  --  1.99* 1.91* 1.80*  CALCIUM 8.3*  --  8.2*  --  8.2* 8.2* 8.7*  MG 2.0   < > 2.0 2.0 1.9 2.0 1.9  PHOS 3.5   < > 3.6 3.6 3.5 2.9 3.2   < > = values in this interval not displayed.   Recent Labs  Lab 01/01/18 0434 01/03/18 0252  AST 18 17  ALT 17 12  ALKPHOS 47 48  BILITOT 0.9 0.9  PROT 6.3* 5.6*  ALBUMIN 1.7* 1.5*   Recent Labs  Lab 01/02/18 0340 01/03/18 0252 01/04/18 0602 01/05/18 0510 01/06/18 0637  WBC 15.1* 16.0* 18.3* 14.9* 17.1*  NEUTROABS  --   --  16.2* 12.3* 15.4*  HGB 8.4* 8.2* 8.1* 8.5* 8.7*  HCT 28.4* 28.1* 27.5* 28.4* 29.0*  MCV 94.4 96.6 96.5 95.9 96.7  PLT 288 277 264 212 283   Recent Labs  Lab 01/01/18 0434 01/01/18 1851 01/03/18 0252  TROPONINI 1.03* 0.94* 0.66*   No results for input(s): LABPROT, INR in the last 72  hours. No results for input(s): COLORURINE, LABSPEC, [place], GLUCOSEU, HGBUR, BILIRUBINUR, KETONESUR, PROTEINUR, UROBILINOGEN, NITRITE, LEUKOCYTESUR in the last 72 hours.  Invalid input(s): APPERANCEUR     Component Value Date/Time   CHOL 73 01/05/2018 0510   TRIG 60 01/05/2018 0510   HDL 21 (L) 01/05/2018 0510   CHOLHDL 3.5 01/05/2018 0510   VLDL 12 01/05/2018 0510   LDLCALC 40 01/05/2018 0510   Lab Results  Component Value Date   HGBA1C 5.9 (H) 12/24/2017      Component Value Date/Time   LABOPIA NONE DETECTED 09/20/2016 1530   COCAINSCRNUR NONE DETECTED 09/20/2016 1530   LABBENZ NONE DETECTED 09/20/2016 1530   AMPHETMU NONE DETECTED 09/20/2016 1530   THCU NONE DETECTED 09/20/2016 1530   LABBARB NONE DETECTED 09/20/2016 1530    No results for input(s): ETH in the last 168 hours.  I have personally reviewed the radiological images below and agree with the radiology interpretations.  Mr Brain Wo Contrast  Result Date: 01/03/2018 CLINICAL DATA:  Stroke follow-up.  EXAM: MRI HEAD WITHOUT CONTRAST TECHNIQUE: Multiplanar, multiecho pulse sequences of the brain and surrounding structures were obtained without intravenous contrast. COMPARISON:  12/05/2017 FINDINGS: Brain: There are 3 subcentimeter foci of restricted diffusion compatible with acute infarcts involving posterior left temporal lobe cortex, left postcentral gyrus, and cortex or subcortical white matter of the left middle frontal gyrus. A subcentimeter focus of diffusion abnormality in the splenium of the corpus callosum has also been present on prior studies and, while more conspicuous today, is favored to be artifactual rather than reflecting an acute infarct. No intracranial hemorrhage, mass, midline shift, or extra-axial fluid collection is identified. There is moderate cerebral atrophy. Extensive T2 hyperintensities throughout the cerebral white matter and brainstem are unchanged and nonspecific but compatible with severe  chronic small vessel ischemic disease. Chronic lacunar infarcts are again noted in the basal ganglia and thalami. There is also a small chronic cortical infarct in the right precentral gyrus. Vascular: Major intracranial vascular flow voids are preserved. Skull and upper cervical spine: Diffusely heterogeneous bone marrow signal without suspicious focal lesion identified. Sinuses/Orbits: Bilateral cataract extraction. Clear paranasal sinuses. Trace left mastoid effusion. Other: None. IMPRESSION: 1. Three subcentimeter acute infarcts in the left cerebral hemisphere. 2. Severe chronic small vessel ischemic disease. Electronically Signed   By: Logan Bores M.D.   On: 01/03/2018 15:07   US Abdomen Limited  Result Date: 12/19/2017 CLINICAL DATA:  Periumbilical nodule. EXAM: ULTRASOUND ABDOMEN LIMITED COMPARISON:  CT scan of November 04, 2017. FINDINGS: Limited sonographic evaluation of the periumbilical region demonstrates moderate size hernia. There is solid density within the hernia which may represent bowel loops, although no peristalsis is observed. The possibility of this representing enlarged lymph node or other neoplasm cannot be excluded. IMPRESSION: Moderate size periumbilical hernia is noted. Solid density is noted within the hernia which may represent bowel loop, but enlarged lymph node or neoplasm cannot be excluded. Clinical correlation is recommended. Electronically Signed   By: Marijo Conception, M.D.   On: 12/19/2017 12:44   TTE 12/21/17 - Normal LV size with mild LV hypertrophy. EF 30%, diffuse   hypokinesis. Moderate diastolic dysfunction. Normal RV size with   mildly decreased systolic function. Mild to moderate MR.     PHYSICAL EXAM  Temp:  [98.1 F (36.7 C)-99.3 F (37.4 C)] 98.2 F (36.8 C) (12/08 1200) Pulse Rate:  [77-118] 96 (12/08 1300) Resp:  [11-35] 19 (12/08 1300) BP: (97-150)/(45-87) 150/78 (12/08 1300) SpO2:  [100 %] 100 % (12/08 1300) FiO2 (%):  [40 %] 40 % (12/07  2013)  General - frail and malnourished-looking elderly African-American lady. She is in mild respiratory distress and is on BiPAP. Ophthalmologic - fundi not visualized due to noncooperation.  Cardiovascular - Regular rate and rhythm.  Neuro - awake, eyes open, Confused and disoriented. Dysarthric speech and can be barely understood. . Followed commands to close eyes, showing two fingers and thrumb on the right hand, but did not follow other simple commands. PERRL, EOMI, no gaze preference, not consistently blinking to visual threat bilaterally but tracking to objects in both visual fields. Face is symmetric tongue protrusion not able to cooperate. Moving all extremities symmetrically with prompt and pain stimulation. DTR 1+ and b/l babinski. Sensation, coordination and gait not tested.    ASSESSMENT/PLAN [name] is a 84 y.o. female with history of HTN, HLD, DM, CKD, cardiomyopathy, CAD s/p CABG, stroke admitted on 12/05/17 for failure to thrive. Found to have achalasia with weight loss and was treated  with botox. Pt was also found to have episode of aphasia, neurology consulted and MRI was negative, considered encephalopathy given multiple medical issues at that time. Pt eventually received PEG tube for nutrition. However, her admission was complicated by pulmonary edema, CHF with EF 30%, aspiration and was treated with zosyn and vanco, NSTEMI with elevated troponin and now off heparin due to anemia required transfusion, UTI. she had further respiratory failure on 01/01/18 and was intubated and transferred to 4N with CCM management. Sputum culture showed Klebsiella and now on meropenum. MRI brain showed 3 punctate infarcts at left temporal and frontal lobes. Neurology was called back for recommendations.    Incidental stroke - 3 punctate left temproal and left frontal infarcts, etiology unclear,  but will not explain pt encephalopathy  MRI punctate left temproal and left frontal  infarcts  MRA hold off for now given agitation and not change management   Carotid Doppler  was nondiagnostic due to patient lack of cooperation.  2D Echo  12/21/17 EF 30%  TTE repeat diffuse hypokinesis worse in the inferior wall. Concentric hypertrophy. Ejection fraction 30%.  LE venous doppler :Right lower extremity no DVT. Left side could not be studied as patient refused exam  .CT abd/pelvis 10/2017 no malignancy   LDL 40  HgbA1c 5.9  SCDs for VTE prophylaxis  No antithrombotic prior to admission, now on clopidogrel 75 mg daily.   Ongoing aggressive stroke risk factor management  Therapy recommendations: pending    Disposition:  Pending   Encephalopathy due to multiple medical conditions  Multifactorial   FTT  Pulmonary edema  CHF with low EF  Anemia   UTI and aspiration pneumonia  NSTEMI with elevated troponin  Respiratory failure  Intubated on 01/01/18  Plan to extubate today  Aspiration pneumonia with sputum culture Klebsiella  On meropenum  CCM on board  History of stroke  08/2016 left sided weakness and numbness, MRI right thalamic and IC infarct. MRA head and neck neg. EF 55-60%, LDL 101 and A1C 7.7 - put on ASA and plavix and lipitor 40 and discharged to CIR  01/2017 followed with [facility] for fall and left leg injury  Diabetes  HgbA1c 5.9 goal < 7.0  Controlled  On levemir  CBG monitoring  SSI  close PCP follow up  Hypertension . Stable  BP goal normotensive  CCM on board  Hyperlipidemia  Home meds:  lipitor 40  LDL 40, goal < 70  Now on lipitor 40  Continue statin at discharge  CKD stage III  Cre 2.11->1.99->1.91  On IVF  Other Stroke Risk Factors  Advanced age  CHF with low EF  CAD s/p CABG  Other Active Problems  Leukocytosis WBC 18.3->14.9  Anemia due to CKD - Hb 8.1->8.5  Hospital day # 29  .I feel she has multifactorial encephalopathy which should gradually improve and the small strokes that  she has a likely from small vessel disease and are not significantly contributing to her medical presentation I recommend continue weaning off respiratory support and transfer to rehabilitation when medically stable. I do not believe further stroke related testing in the form of TEE will be beneficial given her poor general medical condition. [name] son is in agreement. This patient is critically ill due to FTT, sepsis, UTI, aspiration pneumonia, respiratory failure, stroke, NSTEMI and at significant risk of neurological worsening, death form recurrent stroke, heart failure, seizure and sepsis. This patient's care requires constant monitoring of vital signs, hemodynamics, respiratory and cardiac monitoring, review  of multiple databases, neurological assessment, discussion with family, other specialists and medical decision making of high complexity. I spent 57minutes of neurocritical care time in the care of this patient.   Antony Contras, MD Stroke Neurology 01/07/2018 1:11 PM    To contact Stroke Continuity provider, please refer to [email or web address] After hours, contact General Neurology

## 2018-01-07 NOTE — Progress Notes (Signed)
CBG at 0831 was 54, recheck with blood obtained from PICC was 62. Administration of D50 delayed due to PICC difficulties, given at 0850. Recheck from PICC 177.

## 2018-01-07 NOTE — Progress Notes (Signed)
Pt had to be placed back on Bipap due to increased RR, WOB and accessory muscle use.  Pt looks comfortable. Mask secured.

## 2018-01-08 ENCOUNTER — Ambulatory Visit: Payer: Medicare Other | Admitting: Physical Medicine & Rehabilitation

## 2018-01-08 LAB — BASIC METABOLIC PANEL
Anion gap: 11 (ref 5–15)
BUN: 53 mg/dL — ABNORMAL HIGH (ref 8–23)
CO2: 26 mmol/L (ref 22–32)
Calcium: 8.8 mg/dL — ABNORMAL LOW (ref 8.9–10.3)
Chloride: 112 mmol/L — ABNORMAL HIGH (ref 98–111)
Creatinine, Ser: 1.58 mg/dL — ABNORMAL HIGH (ref 0.44–1.00)
GFR calc non Af Amer: 29 mL/min — ABNORMAL LOW (ref >60)
GFR, EST AFRICAN AMERICAN: 34 mL/min — AB (ref >60)
Glucose, Bld: 149 mg/dL — ABNORMAL HIGH (ref 70–99)
Potassium: 3.4 mmol/L — ABNORMAL LOW (ref 3.5–5.1)
Sodium: 149 mmol/L — ABNORMAL HIGH (ref 135–145)

## 2018-01-08 LAB — CBC WITH DIFFERENTIAL/PLATELET
Abs Immature Granulocytes: 0.06 10*3/uL (ref 0.00–0.07)
BASOS ABS: 0.1 10*3/uL (ref 0.0–0.1)
Basophils Relative: 1 %
Eosinophils Absolute: 0.2 10*3/uL (ref 0.0–0.5)
Eosinophils Relative: 1 %
HCT: 24.9 % — ABNORMAL LOW (ref 36.0–46.0)
Hemoglobin: 7.3 g/dL — ABNORMAL LOW (ref 12.0–15.0)
Immature Granulocytes: 1 %
LYMPHS PCT: 10 %
Lymphs Abs: 1.2 10*3/uL (ref 0.7–4.0)
MCH: 28.7 pg (ref 26.0–34.0)
MCHC: 29.3 g/dL — ABNORMAL LOW (ref 30.0–36.0)
MCV: 98 fL (ref 80.0–100.0)
Monocytes Absolute: 0.9 10*3/uL (ref 0.1–1.0)
Monocytes Relative: 7 %
Neutro Abs: 9.6 10*3/uL — ABNORMAL HIGH (ref 1.7–7.7)
Neutrophils Relative %: 80 %
Platelets: 214 10*3/uL (ref 150–400)
RBC: 2.54 MIL/uL — ABNORMAL LOW (ref 3.87–5.11)
RDW: 16.2 % — ABNORMAL HIGH (ref 11.5–15.5)
WBC: 11.9 10*3/uL — ABNORMAL HIGH (ref 4.0–10.5)
nRBC: 0 % (ref 0.0–0.2)

## 2018-01-08 LAB — GLUCOSE, CAPILLARY
Glucose-Capillary: 112 mg/dL — ABNORMAL HIGH (ref 70–99)
Glucose-Capillary: 144 mg/dL — ABNORMAL HIGH (ref 70–99)
Glucose-Capillary: 122 mg/dL — ABNORMAL HIGH (ref 70–99)
Glucose-Capillary: 105 mg/dL — ABNORMAL HIGH (ref 70–99)
Glucose-Capillary: 113 mg/dL — ABNORMAL HIGH (ref 70–99)

## 2018-01-08 LAB — MAGNESIUM: Magnesium: 2 mg/dL (ref 1.7–2.4)

## 2018-01-08 LAB — PHOSPHORUS: Phosphorus: 3.1 mg/dL (ref 2.5–4.6)

## 2018-01-08 MED ORDER — POTASSIUM CHLORIDE 10 MEQ/50ML IV SOLN
10.0000 meq | INTRAVENOUS | Status: AC
  Administered 2018-01-08 (×4): 10 meq via INTRAVENOUS
  Filled 2018-01-08 (×4): qty 50

## 2018-01-08 MED ORDER — VITAL AF 1.2 CAL PO LIQD
1000.0000 mL | ORAL | Status: DC
  Administered 2018-01-08: 1000 mL

## 2018-01-08 MED ORDER — FUROSEMIDE 10 MG/ML IJ SOLN
40.0000 mg | Freq: Once | INTRAMUSCULAR | Status: AC
  Administered 2018-01-08: 40 mg via INTRAVENOUS
  Filled 2018-01-08: qty 4

## 2018-01-08 NOTE — Progress Notes (Signed)
STROKE TEAM PROGRESS NOTE   SUBJECTIVE (INTERVAL HISTORY) Her RNis at the bedside.  Overall her condition is unchanged. She is yet on BiPAP  She is resting comfortably right now on BiPAP.   She remains confused and follows occasional commands OBJECTIVE Temp:  [97.7 F (36.5 C)-99.1 F (37.3 C)] 99.1 F (37.3 C) (12/09 1202) Pulse Rate:  [84-106] 93 (12/09 1300) Cardiac Rhythm: Normal sinus rhythm (12/09 0800) Resp:  [11-28] 19 (12/09 1300) BP: (102-147)/(52-87) 126/71 (12/09 1300) SpO2:  [93 %-100 %] 100 % (12/09 1300)  Recent Labs  Lab 01/07/18 1955 01/07/18 2334 01/08/18 0319 01/08/18 0821 01/08/18 1146  GLUCAP 136* 101* 112* 144* 122*   Recent Labs  Lab 01/03/18 1920 01/04/18 0602 01/05/18 0510 01/06/18 0637 01/07/18 1810 01/08/18 0536  NA  --  143 144 146* 147* 149*  K  --  3.1* 3.7 3.4* 3.5 3.4*  CL  --  106 107 110 112* 112*  CO2  --  26 26 24 25 26   GLUCOSE  --  164* 124* 164* 191* 149*  BUN  --  41* 41* 42* 51* 53*  CREATININE  --  1.99* 1.91* 1.80* 1.60* 1.58*  CALCIUM  --  8.2* 8.2* 8.7* 8.9 8.8*  MG 2.0 1.9 2.0 1.9  --  2.0  PHOS 3.6 3.5 2.9 3.2  --  3.1   Recent Labs  Lab 01/03/18 0252  AST 17  ALT 12  ALKPHOS 48  BILITOT 0.9  PROT 5.6*  ALBUMIN 1.5*   Recent Labs  Lab 01/04/18 0602 01/05/18 0510 01/06/18 0637 01/07/18 1810 01/08/18 0536  WBC 18.3* 14.9* 17.1* 12.8* 11.9*  NEUTROABS 16.2* 12.3* 15.4* 10.7* 9.6*  HGB 8.1* 8.5* 8.7* 7.9* 7.3*  HCT 27.5* 28.4* 29.0* 26.9* 24.9*  MCV 96.5 95.9 96.7 98.2 98.0  PLT 264 212 283 231 214   Recent Labs  Lab 01/01/18 1851 01/03/18 0252  TROPONINI 0.94* 0.66*   No results for input(s): LABPROT, INR in the last 72 hours. No results for input(s): COLORURINE, LABSPEC, [place], GLUCOSEU, HGBUR, BILIRUBINUR, KETONESUR, PROTEINUR, UROBILINOGEN, NITRITE, LEUKOCYTESUR in the last 72 hours.  Invalid input(s): APPERANCEUR     Component Value Date/Time   CHOL 73 01/05/2018 0510   TRIG 60 01/05/2018  0510   HDL 21 (L) 01/05/2018 0510   CHOLHDL 3.5 01/05/2018 0510   VLDL 12 01/05/2018 0510   LDLCALC 40 01/05/2018 0510   Lab Results  Component Value Date   HGBA1C 5.9 (H) 12/24/2017      Component Value Date/Time   LABOPIA NONE DETECTED 09/20/2016 1530   COCAINSCRNUR NONE DETECTED 09/20/2016 1530   LABBENZ NONE DETECTED 09/20/2016 1530   AMPHETMU NONE DETECTED 09/20/2016 1530   THCU NONE DETECTED 09/20/2016 1530   LABBARB NONE DETECTED 09/20/2016 1530    No results for input(s): ETH in the last 168 hours.  I have personally reviewed the radiological images below and agree with the radiology interpretations.  Mr Brain Wo Contrast  Result Date: 01/03/2018 CLINICAL DATA:  Stroke follow-up. EXAM: MRI HEAD WITHOUT CONTRAST TECHNIQUE: Multiplanar, multiecho pulse sequences of the brain and surrounding structures were obtained without intravenous contrast. COMPARISON:  12/05/2017 FINDINGS: Brain: There are 3 subcentimeter foci of restricted diffusion compatible with acute infarcts involving posterior left temporal lobe cortex, left postcentral gyrus, and cortex or subcortical white matter of the left middle frontal gyrus. A subcentimeter focus of diffusion abnormality in the splenium of the corpus callosum has also been present on prior studies and, while  more conspicuous today, is favored to be artifactual rather than reflecting an acute infarct. No intracranial hemorrhage, mass, midline shift, or extra-axial fluid collection is identified. There is moderate cerebral atrophy. Extensive T2 hyperintensities throughout the cerebral white matter and brainstem are unchanged and nonspecific but compatible with severe chronic small vessel ischemic disease. Chronic lacunar infarcts are again noted in the basal ganglia and thalami. There is also a small chronic cortical infarct in the right precentral gyrus. Vascular: Major intracranial vascular flow voids are preserved. Skull and upper cervical spine:  Diffusely heterogeneous bone marrow signal without suspicious focal lesion identified. Sinuses/Orbits: Bilateral cataract extraction. Clear paranasal sinuses. Trace left mastoid effusion. Other: None. IMPRESSION: 1. Three subcentimeter acute infarcts in the left cerebral hemisphere. 2. Severe chronic small vessel ischemic disease. Electronically Signed   By: Logan Bores M.D.   On: 01/03/2018 15:07   US Abdomen Limited  Result Date: 12/19/2017 CLINICAL DATA:  Periumbilical nodule. EXAM: ULTRASOUND ABDOMEN LIMITED COMPARISON:  CT scan of November 04, 2017. FINDINGS: Limited sonographic evaluation of the periumbilical region demonstrates moderate size hernia. There is solid density within the hernia which may represent bowel loops, although no peristalsis is observed. The possibility of this representing enlarged lymph node or other neoplasm cannot be excluded. IMPRESSION: Moderate size periumbilical hernia is noted. Solid density is noted within the hernia which may represent bowel loop, but enlarged lymph node or neoplasm cannot be excluded. Clinical correlation is recommended. Electronically Signed   By: Marijo Conception, M.D.   On: 12/19/2017 12:44   TTE 12/21/17 - Normal LV size with mild LV hypertrophy. EF 30%, diffuse   hypokinesis. Moderate diastolic dysfunction. Normal RV size with   mildly decreased systolic function. Mild to moderate MR.     PHYSICAL EXAM  Temp:  [97.7 F (36.5 C)-99.1 F (37.3 C)] 99.1 F (37.3 C) (12/09 1202) Pulse Rate:  [84-106] 93 (12/09 1300) Resp:  [11-28] 19 (12/09 1300) BP: (102-147)/(52-87) 126/71 (12/09 1300) SpO2:  [93 %-100 %] 100 % (12/09 1300)  General - frail and malnourished-looking elderly African-American lady. She is in mild respiratory distress and is on BiPAP. Ophthalmologic - fundi not visualized due to noncooperation.  Cardiovascular - Regular rate and rhythm.  Neuro - awake, eyes open, Confused and disoriented. Dysarthric speech and can be  barely understood. . Followed commands to close eyes, showing two fingers and thrumb on the right hand, but did not follow other simple commands. PERRL, EOMI, no gaze preference, not consistently blinking to visual threat bilaterally but tracking to objects in both visual fields. Face is symmetric tongue protrusion not able to cooperate. Moving all extremities symmetrically with prompt and pain stimulation. DTR 1+ and b/l babinski. Sensation, coordination and gait not tested.    ASSESSMENT/PLAN [name] is a 84 y.o. female with history of HTN, HLD, DM, CKD, cardiomyopathy, CAD s/p CABG, stroke admitted on 12/05/17 for failure to thrive. Found to have achalasia with weight loss and was treated with botox. Pt was also found to have episode of aphasia, neurology consulted and MRI was negative, considered encephalopathy given multiple medical issues at that time. Pt eventually received PEG tube for nutrition. However, her admission was complicated by pulmonary edema, CHF with EF 30%, aspiration and was treated with zosyn and vanco, NSTEMI with elevated troponin and now off heparin due to anemia required transfusion, UTI. she had further respiratory failure on 01/01/18 and was intubated and transferred to 4N with CCM management. Sputum culture showed Klebsiella and  now on meropenum. MRI brain showed 3 punctate infarcts at left temporal and frontal lobes. Neurology was called back for recommendations.    Incidental stroke - 3 punctate left temproal and left frontal infarcts, etiology unclear,  but will not explain pt encephalopathy  MRI punctate left temproal and left frontal infarcts  MRA hold off for now given agitation and not change management   Carotid Doppler  was nondiagnostic due to patient lack of cooperation.  2D Echo  12/21/17 EF 30%  TTE repeat diffuse hypokinesis worse in the inferior wall. Concentric hypertrophy. Ejection fraction 30%.  LE venous doppler :Right lower extremity no  DVT. Left side could not be studied as patient refused exam  .CT abd/pelvis 10/2017 no malignancy   LDL 40  HgbA1c 5.9  SCDs for VTE prophylaxis  No antithrombotic prior to admission, now on clopidogrel 75 mg daily.   Ongoing aggressive stroke risk factor management  Therapy recommendations: pending    Disposition:  Pending   Encephalopathy due to multiple medical conditions  Multifactorial   FTT  Pulmonary edema  CHF with low EF  Anemia   UTI and aspiration pneumonia  NSTEMI with elevated troponin  Respiratory failure  Intubated on 01/01/18  Plan to extubate today  Aspiration pneumonia with sputum culture Klebsiella  On meropenum  CCM on board  History of stroke  08/2016 left sided weakness and numbness, MRI right thalamic and IC infarct. MRA head and neck neg. EF 55-60%, LDL 101 and A1C 7.7 - put on ASA and plavix and lipitor 40 and discharged to CIR  01/2017 followed with [facility] for fall and left leg injury  Diabetes  HgbA1c 5.9 goal < 7.0  Controlled  On levemir  CBG monitoring  SSI  close PCP follow up  Hypertension . Stable  BP goal normotensive  CCM on board  Hyperlipidemia  Home meds:  lipitor 40  LDL 40, goal < 70  Now on lipitor 40  Continue statin at discharge  CKD stage III  Cre 2.11->1.99->1.91  On IVF  Other Stroke Risk Factors  Advanced age  CHF with low EF  CAD s/p CABG  Other Active Problems  Leukocytosis WBC 18.3->14.9  Anemia due to CKD - Hb 8.1->8.5  Hospital day # 30  .I feel she has multifactorial encephalopathy which should gradually improve and the small strokes that she has a likely from small vessel disease and are not significantly contributing to her medical presentation I recommend continue weaning off respiratory support and transfer to rehabilitation when medically stable. I do not believe further stroke related testing in the form of TEE will be beneficial given her poor general medical  condition. Continue Plavix for stroke prevention.[name] son is in agreement. This patient is critically ill due to FTT, sepsis, UTI, aspiration pneumonia, respiratory failure, stroke, NSTEMI and at significant risk of neurological worsening, death form recurrent stroke, heart failure, seizure and sepsis. This patient's care requires constant monitoring of vital signs, hemodynamics, respiratory and cardiac monitoring, review of multiple databases, neurological assessment, discussion with family, other specialists and medical decision making of high complexity. I spent 30 minutes of neurocritical care time in the care of this patient. Stroke team will sign off. Kindly call for questions. Discussed with Dr. Erasmo Leventhal, MD Stroke Neurology 01/08/2018 3:10 PM    To contact Stroke Continuity provider, please refer to [email or web address] After hours, contact General Neurology

## 2018-01-08 NOTE — Progress Notes (Signed)
[place] Progress Note Patient Name: Valda Christenson DOB: 28-Nov-1927 MRN: [id number]   Date of Service  01/08/2018  HPI/Events of Note  K+ = 3.4 and Creatinine = 1.58.   eICU Interventions  Will replace K+.     Intervention Category Major Interventions: Electrolyte abnormality - evaluation and management  [name],[name] Eugene 01/08/2018, 7:17 AM

## 2018-01-08 NOTE — Evaluation (Addendum)
Occupational Therapy Evaluation Patient Details Name: [name] MRN: [id number] DOB: 1927/02/04 Today's Date: 01/08/2018    History of Present Illness 84 yo female admitted on 12/05/17 with onset of UTI and AMS was admitted with ongoing weight loss and dehydration with FTT.  S/p G-tube placement on 12/10/17 with ongoing confusion.  Dx with acute respiratory failure with hypoxia due to pulmonary edema, NSTEMI possibly due to volume overload 11/21, acute HF, acute metabolic encepholopathy (per neurology: 3 punctate left temproal and left frontal infarcts, etiology unclear,  but will not explain pt encephalopathy), aspiration PNA 11/23, anemia (acute on chronic), sacral pressure wound.  Respiratory distress on 11/25 with pt combative vs. lethargy.  VDRF 12/2-12/6.  On Bipap on 12/7.  PMHx:  CAD, AKI, angina, cardiomegaly, CABG, CVA with L hemi, CKD 3, transfusion, pleural effusion, DM, achalasia with botox procedure.   Clinical Impression   Patient supine in bed and agreeable to OT.  Patient oriented to self (name) only, pt with limited verbalizations throughout session but able to follow simple 1 step commands inconsistently.  She was soiled upon entry and required total +2 assist for self care and bed mobility. Pt home setup and history per chart review, as patient unable to provide information. RN present and VSS throughout session. Patient will benefit from continued OT services while admitted and after dc at SNF level in order to optimize return to PLOF with ADLs. Will continue to follow.     Follow Up Recommendations  SNF    Equipment Recommendations  Other (comment)(TBD at next venue of care)    Recommendations for Other Services Other (comment)(Palliative consult)     Precautions / Restrictions Precautions Precautions: Fall Precaution Comments: coccygeal wound, Prevalon boots/ Peg 11/10 with abdominal binder, geo mat Restrictions Weight Bearing Restrictions: No      Mobility Bed  Mobility Overal bed mobility: Needs Assistance Bed Mobility: Rolling Rolling: Total assist;+2 for physical assistance         General bed mobility comments: total assist for rolling in bed, cueing for sequencing and participation   Transfers                 General transfer comment: deferred     Balance                                           ADL either performed or assessed with clinical judgement   ADL Overall ADL's : Needs assistance/impaired Eating/Feeding: NPO   Grooming: Maximal assistance;Wash/dry face;Wash/dry hands;Bed level Grooming Details (indicate cue type and reason): patient attempting to wash face but unable to complete tasks, hand over hand support required Upper Body Bathing: Total assistance;Bed level   Lower Body Bathing: Total assistance;+2 for physical assistance;Bed level   Upper Body Dressing : Total assistance;Bed level   Lower Body Dressing: Total assistance;+2 for physical assistance;Bed level       Toileting- Clothing Manipulation and Hygiene: Total assistance;+2 for physical assistance;Bed level       Functional mobility during ADLs: Total assistance General ADL Comments: patient requires total assist bed level for ADLs, soiled upon entry and requires +2 to complete self care with limited partipcation      Vision   Additional Comments: further assessment required     Perception     Praxis      Pertinent Vitals/Pain Pain Assessment: No/denies pain Faces Pain Scale: No hurt  Hand Dominance     Extremity/Trunk Assessment Upper Extremity Assessment Upper Extremity Assessment: RUE deficits/detail;LUE deficits/detail;Difficult to assess due to impaired cognition RUE Deficits / Details: grossly 3+/5, WFL ROM  LUE Deficits / Details: grossly 3-/5 MMT, WFL ROM but noted decreased coordination compred to R side LUE Coordination: decreased fine motor;decreased gross motor   Lower Extremity  Assessment Lower Extremity Assessment: Defer to PT evaluation       Communication Communication Communication: Other (comment);Expressive difficulties(limited verbalizations)   Cognition Arousal/Alertness: Lethargic Behavior During Therapy: Restless;Flat affect Overall Cognitive Status: Difficult to assess Area of Impairment: Orientation;Attention;Memory;Following commands;Safety/judgement;Awareness;Problem solving                 Orientation Level: Disoriented to;Person;Place;Time;Situation(pt able to say her name but not birthday) Current Attention Level: Focused Memory: Decreased recall of precautions;Decreased short-term memory Following Commands: Follows one step commands inconsistently;Follows one step commands with increased time Safety/Judgement: Decreased awareness of safety;Decreased awareness of deficits Awareness: Intellectual Problem Solving: Decreased initiation;Difficulty sequencing;Requires verbal cues;Requires tactile cues General Comments: pt arousable. in bed with mittens on.  Restless and once mittens removed attempts to grab at peg tube.  Patient able to follow commands intermittently, but not consistently during session.    General Comments       Exercises     Shoulder Instructions      Home Living Family/patient expects to be discharged to:: Skilled nursing facility Living Arrangements: Children Available Help at Discharge: Available PRN/intermittently Type of Home: House Home Access: Stairs to enter Entrance Stairs-Number of Steps: 2 Entrance Stairs-Rails: None Home Layout: Multi-level Alternate Level Stairs-Number of Steps: 5 and 5        Bathroom Toilet: Standard     Home Equipment: Walker - 2 wheels;Shower seat;Wheelchair - manual   Additional Comments: home setup taken from PT eval, patient unable to provide information to OT      Prior Functioning/Environment Level of Independence: Needs assistance  Gait / Transfers Assistance  Needed: RW with no assist in recent history per chart ADL's / Homemaking Assistance Needed: living in her own home recently and performing ADLs. Family hired someone for IADLs   Comments: per chart review from PT eval        OT Problem List: Decreased strength;Decreased range of motion;Decreased activity tolerance;Impaired balance (sitting and/or standing);Decreased safety awareness;Decreased knowledge of use of DME or AE;Decreased knowledge of precautions;Decreased cognition;Decreased coordination      OT Treatment/Interventions: Self-care/ADL training;Therapeutic exercise;DME and/or AE instruction;Therapeutic activities;Patient/family education;Cognitive remediation/compensation;Balance training;Energy conservation;Neuromuscular education    OT Goals(Current goals can be found in the care plan section) Acute Rehab OT Goals Patient Stated Goal: unable to state Time For Goal Achievement: 01/03/18 Potential to Achieve Goals: Fair  OT Frequency: Min 2X/week   Barriers to D/C:            Co-evaluation              AM-PAC OT "6 Clicks" Daily Activity     Outcome Measure Help from another person eating meals?: Total Help from another person taking care of personal grooming?: A Lot Help from another person toileting, which includes using toliet, bedpan, or urinal?: Total Help from another person bathing (including washing, rinsing, drying)?: Total Help from another person to put on and taking off regular upper body clothing?: Total Help from another person to put on and taking off regular lower body clothing?: Total 6 Click Score: 7   End of Session Nurse Communication: Mobility status  Activity Tolerance: Patient limited by  fatigue;Patient limited by lethargy Patient left: in bed;with call bell/phone within reach;with restraints reapplied;with SCD's reapplied;with bed alarm set  OT Visit Diagnosis: Other abnormalities of gait and mobility (R26.89);Muscle weakness  (generalized) (M62.81);Other symptoms and signs involving cognitive function                Time: 4210-3128 OT Time Calculation (min): 36 min Charges:  OT General Charges $OT Visit: 1 Visit OT Evaluation $OT Eval High Complexity: 1 High OT Treatments $Self Care/Home Management : 8-22 mins  Delight Stare, OT Acute Rehabilitation Services Pager [phone] Office [phone]   Delight Stare 01/08/2018, 5:23 PM

## 2018-01-08 NOTE — Progress Notes (Signed)
NAME:  [name], MRN:  [id number], DOB:  May 03, 1927, LOS: 61 ADMISSION DATE:  12/05/2017, CONSULTATION DATE:  01/01/18 REFERRING MD:  Cruzita Lederer  CHIEF COMPLAINT:  Respiratory Distress   Brief History   [name] is a 84 y.o. female who was initially admitted 11/5 with aphasia.  Had imaging that was negative for CVA.  Has had complicated course with FTT, AoC combined heart failure, AoCKD, acute pulmonary edema, respiratory distress.  On 12/2, had recurrent episode of respiratory distress requiring intubation. Extubated on 12/6.  Past Medical History  HTN, combined heart failure, CAD, cardiomyopathy, CKD, DM II, FTT.  Significant Hospital Events   11/5 > admit. 11/10 > G tube placed by IR. 11/13 > PMT consulted. 11/20 > neuro consult.  Rapid response for respiratory distress, treated with lasix. 11/21 > cardiology consult for NSTEMI. 11/23 > rapid response for respiratory distress, treated with lasix and morphine. 11/27 > ENT consulted for stridor, flexible laryngoscopy negative. 12/1 > rapid response for respiratory distress, treated with morphine. 12/2 > PCCM consulted for agonal respirations, transferred to ICU for intubation. 12/3/>> Stable right atelectasis and effusion 01/05/2018 Extubated 12/7 Required BiPAP throughout the day  Consults:  Radiology. Palliative Care. Surgery. Neurology.  Signed off Cardiology.  Signed off ENT. PCCM. Nephrology.  Signed off  Procedures:  G tube 11/10 > ETT 12/2 > 12/6  Significant Diagnostic Tests:  CT head 11/5 > chronic ischemic disease. MRI brain 11/5 > no acute process. Abd [place] 11/19 > mod size periumbilical hernia with solid density within.   Echo 11/21 > EF 30%, G2DD. Flex laryngoscopy 11/27 > negative. 01/03/2018 MRI of the brain with 3 subcentimeter infarcts in cerebellar region left  Micro Data:  Urine 11/5 > enterococcus faecalis. Blood 11/22 > negative. Urine 11/23 > enterococcus faecalis. Blood 12/2 > no growth to  date>>NEG Sputum 12/2 > Moderate gram negative rods>> Klebsiella pneumoniae resistant to Zosyn  Antimicrobials:  Ceftriaxone 11/5 > 11/6. Ampicillin 11/6 > 11/8. Zosyn 11/24 > 11/29.  12/2 > 12/3 01/03/2018 meropenem>>  Interim history/subjective:  Stable overnight.  Just came off BiPAP today morning No distress.  Objective:  Blood pressure 138/74, pulse 98, temperature 97.7 F (36.5 C), temperature source Axillary, resp. rate 13, height 5\' 3"  (1.6 m), weight 59 kg, SpO2 100 %.        Intake/Output Summary (Last 24 hours) at 01/08/2018 0916 Last data filed at 01/08/2018 0600 Gross per 24 hour  Intake 1471.03 ml  Output 800 ml  Net 671.03 ml   Filed Weights   12/28/17 0452 12/29/17 0300 12/31/17 0600  Weight: 58.1 kg 58 kg 59 kg    Examination: Blood pressure 138/74, pulse 98, temperature 97.7 F (36.5 C), temperature source Axillary, resp. rate 13, height 5\' 3"  (1.6 m), weight 59 kg, SpO2 100 %. Gen:      Chronically ill-appearing. HEENT:  EOMI, sclera anicteric Neck:     No masses; no thyromegaly Lungs:    Clear to auscultation bilaterally; normal respiratory effort CV:         Regular rate and rhythm; no murmurs Abd:      + bowel sounds; soft, non-tender; no palpable masses, no distension Ext:    1+ edema; adequate peripheral perfusion Skin:      Warm and dry; no rash Neuro: Awake, responds to commands.  Assessment & Plan:  Acute hypoxemic respiratory failure - presumed r/t pulmonary edema in setting acute on chronic combined heart failure as well as klebsiella PNA +/- aspiration.  Extubated 12/6. Intermittent bipap needs.  #Klebsiella pneumonia ESBL #Concern for Aspiration PLAN -  Continue BiPAP at night and as needed during the day Continue diuresis.  Additional Lasix 40 mg IV x1 Continue day 6 of 7 of meropenem for Klebsiella pneumonia Follow-up chest x-ray.  Left temporal and frontal infarcts #Acute encephalopathy - unclear etiology.   MRI 23/06/5730 with  embolic-looking strokes.  Blood cultures negative. TTE with no evidence of endocarditis. No seizure activity on EEG PLAN -  Supportive care per neurology Continue statin, Plavix.  Hypokalemia Replete lytes  Enterococcus UTI Status post treatment with Zosyn  Decompensated combined heart failure  NSTEMI - s/p heparin gtt. Echo from 11/21 with EF 30% and G2DD. Echo 12/6 with diffuse hypokinesis with EF 30%, mod-severe MR PLAN -  Continue diuresis as tolerated Continue Plavix. Cardiology signed off 01/03/2018    CKD - improving PLAN -  Renal following - not candidate for HD  Follow urine output and creatinine  DM II. PLAN -  SSI   Dysphagia PLAN -  TF via PEG  Continue tube feeds.  Anemia.  Hgb stable.  PLAN -  Monitor on plavix  Follow CBC  FTT  Per imaging and neuro, picture suggestive of underlying vascular dementia. Palliative care had been consulted and family opted for continued full scope of treatment.  Poor long term prognosis.   Best Practice:  Diet: TF's via PEG currently at goal Pain/Anxiety/Delirium protocol (if indicated): Intermittent fentanyl/versed. VAP protocol (if indicated): In place. DVT prophylaxis: SCD's. GI prophylaxis: Pantoprazole. Glucose control: SSI. Mobility: Bedrest. Code Status: Full. Family Communication: No family at bedside Disposition: Remain in ICU  Labs   CBC: Recent Labs  Lab 01/04/18 0602 01/05/18 0510 01/06/18 0637 01/07/18 1810 01/08/18 0536  WBC 18.3* 14.9* 17.1* 12.8* 11.9*  NEUTROABS 16.2* 12.3* 15.4* 10.7* 9.6*  HGB 8.1* 8.5* 8.7* 7.9* 7.3*  HCT 27.5* 28.4* 29.0* 26.9* 24.9*  MCV 96.5 95.9 96.7 98.2 98.0  PLT 264 212 283 231 202   Basic Metabolic Panel: Recent Labs  Lab 01/03/18 1920 01/04/18 0602 01/05/18 0510 01/06/18 0637 01/07/18 1810 01/08/18 0536  NA  --  143 144 146* 147* 149*  K  --  3.1* 3.7 3.4* 3.5 3.4*  CL  --  106 107 110 112* 112*  CO2  --  26 26 24 25 26   GLUCOSE  --  164*  124* 164* 191* 149*  BUN  --  41* 41* 42* 51* 53*  CREATININE  --  1.99* 1.91* 1.80* 1.60* 1.58*  CALCIUM  --  8.2* 8.2* 8.7* 8.9 8.8*  MG 2.0 1.9 2.0 1.9  --  2.0  PHOS 3.6 3.5 2.9 3.2  --  3.1   GFR: Estimated Creatinine Clearance: 20.4 mL/min (A) (by C-G formula based on SCr of 1.58 mg/dL (H)). Recent Labs  Lab 01/01/18 1851  01/02/18 0340 01/03/18 0252  01/05/18 0510 01/06/18 0637 01/07/18 1810 01/08/18 0536  PROCALCITON 1.50  --  1.34 1.09  --   --   --   --   --   WBC  --    < > 15.1* 16.0*   < > 14.9* 17.1* 12.8* 11.9*   < > = values in this interval not displayed.   Liver Function Tests: Recent Labs  Lab 01/03/18 0252  AST 17  ALT 12  ALKPHOS 48  BILITOT 0.9  PROT 5.6*  ALBUMIN 1.5*   No results for input(s): LIPASE, AMYLASE in the last 168 hours. No  results for input(s): AMMONIA in the last 168 hours. ABG    Component Value Date/Time   PHART 7.482 (H) 01/03/2018 0420   PCO2ART 42.2 01/03/2018 0420   PO2ART 151.0 (H) 01/03/2018 0420   HCO3 31.5 (H) 01/03/2018 0420   TCO2 33 (H) 01/03/2018 0420   ACIDBASEDEF 2.8 (H) 12/20/2017 2235   O2SAT 99.0 01/03/2018 0420    Coagulation Profile: No results for input(s): INR, PROTIME in the last 168 hours. Cardiac Enzymes: Recent Labs  Lab 01/01/18 1851 01/03/18 0252  TROPONINI 0.94* 0.66*   CBG: Recent Labs  Lab 01/07/18 1637 01/07/18 1955 01/07/18 2334 01/08/18 0319 01/08/18 0821  GLUCAP 169* 136* 101* 112* 144*    The patient is critically ill with multiple organ system failure and requires high complexity decision making for assessment and support, frequent evaluation and titration of therapies, advanced monitoring, review of radiographic studies and interpretation of complex data.   Critical Care Time devoted to patient care services, exclusive of separately billable procedures, described in this note is 35 minutes.   Marshell Garfinkel MD [facility] Pager [phone] If no  answer or after 3pm call: [phone] 01/08/2018, 9:28 AM

## 2018-01-08 NOTE — Evaluation (Signed)
Clinical/Bedside Swallow Evaluation Patient Details  Name: Kennady Zimmerle MRN: [id number] Date of Birth: 1927/12/24  Today's Date: 01/08/2018 Time: SLP Start Time (ACUTE ONLY): 1140 SLP Stop Time (ACUTE ONLY): 1150 SLP Time Calculation (min) (ACUTE ONLY): 10 min  Past Medical History:  Past Medical History:  Diagnosis Date  . Acute renal injury ([place]) 07/2016  . Anginal pain ([place]) 2004; 2011  . CAD (coronary artery disease)    status post stenting of the RCA status post CABG. (left internal mammary artery to  LAD, saphenous vein graft to second diagonal, saphenous vein  graft to obtuse marginal 1, saphenous vein graft to posterior  descending).  . Cardiomyopathy     Mildly reduced EF (45%).   . Chronic kidney disease (CKD), stage III (moderate) ([place])   . History of blood transfusion 1960s   "related to anemia"  . HTN (hypertension)   . Pleural effusion 2011   S/P "bypass"  . Shortness of breath   . Stroke ([place]) 09/19/2016   "left sided weakness" (09/20/2016)  . Type II diabetes mellitus ([place])    Past Surgical History:  Past Surgical History:  Procedure Laterality Date  . BOTOX INJECTION  11/20/2017   Procedure: BOTOX INJECTION;  Surgeon: Doran Stabler, MD;  Location: [facility];  Service: Gastroenterology;;  . CATARACT EXTRACTION W/ INTRAOCULAR LENS  IMPLANT, BILATERAL Bilateral   . CORONARY ANGIOPLASTY WITH STENT PLACEMENT  2004   "LAD & one of the circumflex"  . CORONARY ARTERY BYPASS GRAFT  2011   CABG X4  . ESOPHAGOGASTRODUODENOSCOPY (EGD) WITH PROPOFOL N/A 11/20/2017   Procedure: ESOPHAGOGASTRODUODENOSCOPY (EGD) WITH PROPOFOL;  Surgeon: Doran Stabler, MD;  Location: [facility];  Service: Gastroenterology;  Laterality: N/A;  . IR GASTROSTOMY TUBE MOD SED  12/10/2017   HPI:  84 y.o. female with CAD s/p CABG, recent ischemic stroke, HTN, DM, HLP, recent failure to thrive due to swallowing problems s/p botox injection for achalasia, developed acute pulmonary edema  and NSTEMI on 12/20/2017. Bedside swallow on 11/05/17 revealed functional oropharyngeal swallow and suspected esophageal dysphagia. Had esophagram 11/06/17 which showed moderately large hiatal hernia with poor motility and retained barium throughout the esophagus. PEG 12/10/17.  Repeat bedside swallow eval 12/23/17 with recs for NPO secondary to poor MS, no oral anticipation/recognition of POs. Pt with no change in MS that would allow safe oral intake between 11/23 and date of intubation,12/2.  Extubated 12/6. New orders for repeat bedside swallow eval s/p extubation.   Assessment / Plan / Recommendation Clinical Impression  Pt's clinical presentation remains similar to level of function when last seen by SLP services prior to intubation.  Positioning is difficult - pt unable to support head, slumps to right repeatedly despite RN/SLP efforts to maintain her upright.  Oral care provided.  Pt phonated "ah" when cued and followed oral motor commands.  However, she demonstrated poor oral anticipation/recognition of ice chips. There was no spontaneous effort to manipulate nor swallow material, which fell from right oral cavity without recognition.  Pt is not sufficiently alert to resume POs.  It would be helpful to ascertain family's wishes with regard to eating/drinking; continue NPO with PEG for now.  SLP will follow for PO readiness at this time.  SLP Visit Diagnosis: Dysphagia, unspecified (R13.10)    Aspiration Risk  Severe aspiration risk    Diet Recommendation   npo  Medication Administration: Via alternative means    Other  Recommendations Oral Care Recommendations: Oral care QID Other Recommendations: Have  oral suction available   Follow up Recommendations        Frequency and Duration min 1 x/week  2 weeks       Prognosis Prognosis for Safe Diet Advancement: Guarded      Swallow Study   General Date of Onset: 12/05/17 HPI: 84 y.o. female with CAD s/p CABG, recent ischemic stroke,  HTN, DM, HLP, recent failure to thrive due to swallowing problems s/p botox injection for achalasia, developed acute pulmonary edema and NSTEMI on 12/20/2017. Bedside swallow on 11/05/17 revealed functional oropharyngeal swallow and suspected esophageal dysphagia. Had esophagram 11/06/17 which showed moderately large hiatal hernia with poor motility and retained barium throughout the esophagus. PEG 12/10/17.  Repeat bedside swallow eval 12/23/17 with recs for NPO secondary to poor MS, no oral anticipation/recognition of POs. Pt with no change in MS that would allow safe oral intake between 11/23 and date of intubation,12/2.  Extubated 12/6. New orders for repeat bedside swallow eval s/p extubation. Type of Study: Bedside Swallow Evaluation Previous Swallow Assessment: see HPI Diet Prior to this Study: NPO;PEG tube Temperature Spikes Noted: No Respiratory Status: Nasal cannula History of Recent Intubation: Yes Length of Intubations (days): 4 days Date extubated: 01/05/18 Behavior/Cognition: Lethargic/Drowsy;Doesn't follow directions Oral Cavity Assessment: Dried secretions Oral Care Completed by SLP: Yes Oral Cavity - Dentition: Missing dentition Self-Feeding Abilities: Total assist Patient Positioning: Upright in bed;Postural control interferes with function Baseline Vocal Quality: Other (comment)(weak, low volume) Volitional Cough: Cognitively unable to elicit Volitional Swallow: Unable to elicit    Oral/Motor/Sensory Function Overall Oral Motor/Sensory Function: Generalized oral weakness   Ice Chips Ice chips: Impaired Presentation: Spoon Oral Phase Impairments: Reduced labial seal;Poor awareness of bolus Oral Phase Functional Implications: Right anterior spillage   Thin Liquid Thin Liquid: Not tested    Nectar Thick Nectar Thick Liquid: Not tested   Honey Thick Honey Thick Liquid: Not tested   Puree Puree: Not tested   Solid     Solid: Not tested      Juan Quam  Laurice 01/08/2018,12:02 PM   Estill Bamberg L. Tivis Ringer, [place] Office number [phone] Pager [phone]

## 2018-01-08 NOTE — Progress Notes (Signed)
Nutrition Follow-up  DOCUMENTATION CODES:   Non-severe (moderate) malnutrition in context of chronic illness, Underweight  INTERVENTION:   D/C Vital High Protein  Vital AF 1.2 @ 55 ml/hr via PEG  Provides: 1584 kcal, 99 grams protein, and 1070 ml free water.    NUTRITION DIAGNOSIS:   Moderate Malnutrition related to chronic illness(dysphagia 2/2 to achalasia) as evidenced by energy intake < or equal to 75% for > or equal to 1 month, mild fat depletion, moderate fat depletion, mild muscle depletion, moderate muscle depletion, energy intake < or equal to 50% for > or equal to 1 month.  Being addressed via nutrition support  GOAL:   Patient will meet greater than or equal to 90% of their needs  Progress  MONITOR:   PO intake, Supplement acceptance, Labs, Weight trends, Skin, I & O's  ASSESSMENT:   [name] is a 84 y.o. female with medical history significant for DM2, HTN, HLD, CAD s/p CABG, h/o CVA, CKD3, who was recently diagnosed with achalasia after a 50 lb weight loss over the past year. She was treated with botox earlier in October and sent to SNF with the hopes she would be able to swallow her food. She has not done well since that time, has only been able to drink small amounts of milk. Son is a Management consultant in [place] and has been visiting. Yesterday he noticed she was stuttering, having difficulty getting words out, and progressed until she seemed to have total aphasia last night. She does report having dysuria and some lower abd pain. No N/V. Has a good appetite but avoids food because of the obstruction. Megace and remeron have not helped. Process of being worked up for G tube has been initiated and pt reportedly has appt with [facility] tomorrow morning. Son feels like her weight loss and FTT could be reversible if she gets a G tube as she was reportedly very functional a month ago, walking with a walker and performing all of her ADLs.    11/7 - per IR notes,  imaging revealed moderate sized hiatal hernia and colon in upper abdomen, recommending evaluating anatomy with fluoroscopy 11/8 - PICC placed 11/10 - TPN started, IR placed G-tube 11/11 - G-tube cleared for use by RI, TF initiated 11/13 - TPN d/c 11/20 - transitioned to bolus feedings 11/23 - s/p BSE, recommend continued NPO due to AMS, emesis with bolus feedings, rapid response called 11/25 - rapid response, tube feeds held and restarted in the afternoon at 20 ml/hr 11/27 - per MD, tube feeds at 35 ml/hr during the daytime and 30 ml/hr overnight 12/1 - rapid response for increased WOB 12/2 - emesis, TF turned off, intubated 12/6 extubated, BiPAP at night  Per family's GOC, full support continues Per RN pt is tolerating TF.   Labs reviewed: Na 149 (H), K+ 3.4 (L) Medications reviewed and include: Vitamin D 3: 400 daily, 300 mg ferrous sulfate daily, lasix, SSI, 10 units levemir, reglan, MVI, senokot   Diet Order:   Diet Order            Diet NPO time specified  Diet effective now              EDUCATION NEEDS:   Education needs have been addressed  Skin:  Skin Assessment: Skin Integrity Issues: Skin Integrity Issues:: Stage II Stage II: left buttocks, sacrum Other: partial thickness wounds to bilateral buttocks (likely from adhesive)  Last BM:  12/9  Height:   Ht Readings  from Last 1 Encounters:  12/20/17 5\' 3"  (1.6 m)    Weight:   Wt Readings from Last 1 Encounters:  12/31/17 59 kg    Ideal Body Weight:  52.3 kg  BMI:  Body mass index is 23.03 kg/m.  Estimated Nutritional Needs:   Kcal:  1450-1650  Protein:  75-100 grams  Fluid:  > 1.4 L/day  Maylon Peppers RD, LDN, CNSC [phone] Pager [phone] After Hours Pager

## 2018-01-08 NOTE — Telephone Encounter (Signed)
Patient remains in [facility]. Closing encounter.

## 2018-01-09 ENCOUNTER — Inpatient Hospital Stay (HOSPITAL_COMMUNITY): Payer: Medicare Other

## 2018-01-09 LAB — GLUCOSE, CAPILLARY
Glucose-Capillary: 110 mg/dL — ABNORMAL HIGH (ref 70–99)
Glucose-Capillary: 126 mg/dL — ABNORMAL HIGH (ref 70–99)
Glucose-Capillary: 128 mg/dL — ABNORMAL HIGH (ref 70–99)
Glucose-Capillary: 135 mg/dL — ABNORMAL HIGH (ref 70–99)
Glucose-Capillary: 153 mg/dL — ABNORMAL HIGH (ref 70–99)
Glucose-Capillary: 91 mg/dL (ref 70–99)
Glucose-Capillary: 97 mg/dL (ref 70–99)

## 2018-01-09 LAB — BASIC METABOLIC PANEL
Anion gap: 8 (ref 5–15)
BUN: 61 mg/dL — ABNORMAL HIGH (ref 8–23)
CO2: 29 mmol/L (ref 22–32)
Calcium: 9 mg/dL (ref 8.9–10.3)
Chloride: 113 mmol/L — ABNORMAL HIGH (ref 98–111)
Creatinine, Ser: 1.68 mg/dL — ABNORMAL HIGH (ref 0.44–1.00)
GFR calc Af Amer: 31 mL/min — ABNORMAL LOW (ref >60)
GFR calc non Af Amer: 27 mL/min — ABNORMAL LOW (ref >60)
Glucose, Bld: 177 mg/dL — ABNORMAL HIGH (ref 70–99)
Potassium: 4 mmol/L (ref 3.5–5.1)
Sodium: 150 mmol/L — ABNORMAL HIGH (ref 135–145)

## 2018-01-09 LAB — PHOSPHORUS: Phosphorus: 3.3 mg/dL (ref 2.5–4.6)

## 2018-01-09 LAB — CBC WITH DIFFERENTIAL/PLATELET
Abs Immature Granulocytes: 0.05 10*3/uL (ref 0.00–0.07)
Basophils Absolute: 0 10*3/uL (ref 0.0–0.1)
Basophils Relative: 0 %
Eosinophils Absolute: 0.2 10*3/uL (ref 0.0–0.5)
Eosinophils Relative: 1 %
HCT: 25 % — ABNORMAL LOW (ref 36.0–46.0)
Hemoglobin: 7.1 g/dL — ABNORMAL LOW (ref 12.0–15.0)
Immature Granulocytes: 1 %
LYMPHS PCT: 8 %
Lymphs Abs: 0.9 10*3/uL (ref 0.7–4.0)
MCH: 28.3 pg (ref 26.0–34.0)
MCHC: 28.4 g/dL — ABNORMAL LOW (ref 30.0–36.0)
MCV: 99.6 fL (ref 80.0–100.0)
Monocytes Absolute: 0.8 10*3/uL (ref 0.1–1.0)
Monocytes Relative: 7 %
Neutro Abs: 9.1 10*3/uL — ABNORMAL HIGH (ref 1.7–7.7)
Neutrophils Relative %: 83 %
Platelets: 199 10*3/uL (ref 150–400)
RBC: 2.51 MIL/uL — ABNORMAL LOW (ref 3.87–5.11)
RDW: 16.1 % — ABNORMAL HIGH (ref 11.5–15.5)
WBC: 11 10*3/uL — ABNORMAL HIGH (ref 4.0–10.5)
nRBC: 0 % (ref 0.0–0.2)

## 2018-01-09 LAB — MAGNESIUM: MAGNESIUM: 2.1 mg/dL (ref 1.7–2.4)

## 2018-01-09 LAB — TROPONIN I: Troponin I: 0.28 ng/mL (ref <0.03)

## 2018-01-09 MED ORDER — VITAL AF 1.2 CAL PO LIQD
1000.0000 mL | ORAL | Status: DC
  Administered 2018-01-09 – 2018-01-14 (×4): 1000 mL
  Filled 2018-01-09 (×8): qty 1000

## 2018-01-09 MED ORDER — FREE WATER
200.0000 mL | Freq: Three times a day (TID) | Status: DC
  Administered 2018-01-09 (×2): 200 mL

## 2018-01-09 MED ORDER — PANTOPRAZOLE SODIUM 40 MG PO PACK
40.0000 mg | PACK | Freq: Every day | ORAL | Status: DC
  Administered 2018-01-10 – 2018-01-29 (×19): 40 mg
  Filled 2018-01-09 (×15): qty 20

## 2018-01-09 NOTE — Progress Notes (Signed)
[name], female   DOB: [birth year], 84 y.o.   MRN: [id number]  This NP visited patient at the bedside as a follow up for palliative medicine needs. No family at bedside.    Placed call to son Dr Erick Alley C #  [phone] mail box was not set up and unable to leave message  Patient  was admitted on November 5 and patient has had continued physical and functional decline in spite of medical interventions.  Long term poor prognosis.  Currently patient is extubated and attempting intermittent BiPAP for respiratory support.  She is frail and appears and really uncomfortable.  She does open her eyes when I call her name but is unable to follow simple commands.  I sat quietly with the patient and offered quiet presence.  I received a call back from her son Dr. Erick Alley and we had a brief conversation regarding current medical situation.  His main goal for his mother is to continue to treat the treatable and remain hopeful for improvement.  Ultimately he would like for her to return home  Within the context of human mortality and adult failure to thrive the family will face ongoing decisions related to treatment options, advance directive decisions and anticipatory care needs.  It will be necessary to have ongoing goals of care discussions with family depending on outcomes during this hospitalization..  Total time spent on the unit was 25 minutes  Discussed  With Dr Vaughan Browner and CCM team  Greater than 50% of the time was spent in counseling and coordination of care  Wadie Lessen NP  Palliative Medicine Team Team Phone # [phone] Pager [phone]

## 2018-01-09 NOTE — Progress Notes (Signed)
Physical Therapy Treatment Patient Details Name: [name] MRN: [id number] DOB: [birth year] Today's Date: 01/09/2018    History of Present Illness 84 yo female admitted on 12/05/17 with onset of UTI and AMS was admitted with ongoing weight loss and dehydration with FTT.  S/p G-tube placement on 12/10/17 with ongoing confusion.  Dx with acute respiratory failure with hypoxia due to pulmonary edema, NSTEMI possibly due to volume overload 11/21, acute HF, acute metabolic encepholopathy (per neurology: 3 punctate left temproal and left frontal infarcts, etiology unclear,  but will not explain pt encephalopathy), aspiration PNA 11/23, anemia (acute on chronic), sacral pressure wound.  Respiratory distress on 11/25 with pt combative vs. lethargy.  VDRF 12/2-12/6.  On Bipap on 12/7.  PMHx:  CAD, AKI, angina, cardiomegaly, CABG, CVA with L hemi, CKD 3, transfusion, pleural effusion, DM, achalasia with botox procedure.    PT Comments    Pt awake and alert throughout session, pt not speaking throughout session other than mouthing name. She does not initiate movement but would partially assist with lifting legs off surface and lifting left arms off pillow. Pt requires max-total +2 assist for all mobility and is not significantly progressing with mobility at this time. Recommend positioning and ROM with nursing.     Follow Up Recommendations  SNF;Supervision/Assistance - 24 hour     Equipment Recommendations  Hospital bed;Other (comment)(hoyer lift)    Recommendations for Other Services       Precautions / Restrictions Precautions Precautions: Fall Precaution Comments: coccygeal wound, Prevalon boots/ Peg 11/10 with abdominal binder, geo mat    Mobility  Bed Mobility Overal bed mobility: Needs Assistance Bed Mobility: Supine to Sit     Supine to sit: Max assist;HOB elevated     General bed mobility comments: max assist to pivot legs to EOB with pad, scoot fully and lift trunk from surface  with HOB 30degrees. Once EOB pt initially able to maintain sitting min assist with decline to max assist for sitting balance after 1 min. EOB 5 min   Transfers Overall transfer level: Needs assistance   Transfers: Sit to/from Stand Sit to Stand: Total assist         General transfer comment: attempted to rise but pt not making any effort to assist rise from surface. [place] for lift bed to chair  Ambulation/Gait             General Gait Details: unable at this time.    Stairs             Wheelchair Mobility    Modified Rankin (Stroke Patients Only)       Balance Overall balance assessment: Needs assistance Sitting-balance support: Feet supported;No upper extremity supported Sitting balance-Leahy Scale: Zero Sitting balance - Comments: min initially sitting with progressive forward flexion with sitting and max assist for trunk control. Pt then leaning posteriorly after extending trunk                                    Cognition Arousal/Alertness: Awake/alert Behavior During Therapy: Flat affect Overall Cognitive Status: No family/caregiver present to determine baseline cognitive functioning                     Current Attention Level: Focused Memory: Decreased recall of precautions;Decreased short-term memory Following Commands: Follows one step commands inconsistently Safety/Judgement: Decreased awareness of safety;Decreased awareness of deficits   Problem Solving: Slow processing;Decreased initiation;Difficulty  sequencing;Requires verbal cues;Requires tactile cues General Comments: pt mouthing her name and would attempt to mouth a few things but unable to ascertain statements, no verbalization. pt alert and tracking throughout but not following commands for movement      Exercises      General Comments        Pertinent Vitals/Pain Pain Assessment: No/denies pain    Home Living                      Prior Function             PT Goals (current goals can now be found in the care plan section) Progress towards PT goals: Not progressing toward goals - comment    Frequency    Min 2X/week      PT Plan Current plan remains appropriate    Co-evaluation              AM-PAC PT "6 Clicks" Mobility   Outcome Measure  Help needed turning from your back to your side while in a flat bed without using bedrails?: Total Help needed moving from lying on your back to sitting on the side of a flat bed without using bedrails?: Total Help needed moving to and from a bed to a chair (including a wheelchair)?: Total Help needed standing up from a chair using your arms (e.g., wheelchair or bedside chair)?: Total Help needed to walk in hospital room?: Total Help needed climbing 3-5 steps with a railing? : Total 6 Click Score: 6    End of Session Equipment Utilized During Treatment: Oxygen Activity Tolerance: Patient tolerated treatment well Patient left: in chair;with call bell/phone within reach;with chair alarm set Nurse Communication: Mobility status;Need for lift equipment PT Visit Diagnosis: Muscle weakness (generalized) (M62.81);Other abnormalities of gait and mobility (R26.89);Adult, failure to thrive (R62.7);Hemiplegia and hemiparesis     Time: 0831-0850 PT Time Calculation (min) (ACUTE ONLY): 19 min  Charges:  $Therapeutic Activity: 8-22 mins                     [place], PT Acute Rehabilitation Services Pager: [phone] Office: [place] 01/09/2018, 11:26 AM

## 2018-01-09 NOTE — Progress Notes (Signed)
RT NOTE:  Pt transferring to 2W36. Pt has order for BIPAP as needed @ night. BIPAP will be transferred with her. Report given to [place], RRT.

## 2018-01-09 NOTE — Progress Notes (Signed)
CRITICAL VALUE ALERT  Critical Value:  Troponin 0.28  Date & Time Notied:  01/09/18 at [place]  Provider Notified: Dr. Vaughan Browner  Orders Received/Actions taken: Serial troponins

## 2018-01-09 NOTE — Progress Notes (Addendum)
NAME:  [name], MRN:  [id number], DOB:  1927-03-23, LOS: 6 ADMISSION DATE:  12/05/2017, CONSULTATION DATE:  01/01/18 REFERRING MD:  Cruzita Lederer  CHIEF COMPLAINT:  Respiratory Distress   Brief History   [name] is a 84 y.o. female who was initially admitted 11/5 with aphasia.  Had imaging that was negative for CVA.  Has had complicated course with FTT, AoC combined heart failure, AoCKD, acute pulmonary edema, respiratory distress.  On 12/2, had recurrent episode of respiratory distress requiring intubation. Extubated on 12/6.  Past Medical History  HTN, combined heart failure, CAD, cardiomyopathy, CKD, DM II, FTT.  Significant Hospital Events   11/5 > admit. 11/10 > G tube placed by IR. 11/13 > PMT consulted. 11/20 > neuro consult.  Rapid response for respiratory distress, treated with lasix. 11/21 > cardiology consult for NSTEMI. 11/23 > rapid response for respiratory distress, treated with lasix and morphine. 11/27 > ENT consulted for stridor, flexible laryngoscopy negative. 12/1 > rapid response for respiratory distress, treated with morphine. 12/2 > PCCM consulted for agonal respirations, transferred to ICU for intubation. 12/3/>> Stable right atelectasis and effusion 01/05/2018 Extubated 12/7 Required BiPAP throughout the day  Consults:  Radiology. Palliative Care. Surgery. Neurology.  Signed off Cardiology.  Signed off ENT. PCCM. Nephrology.  Signed off  Procedures:  G tube 11/10 > ETT 12/2 > 12/6  Significant Diagnostic Tests:  CT head 11/5 > chronic ischemic disease. MRI brain 11/5 > no acute process. Abd [place] 11/19 > mod size periumbilical hernia with solid density within.   Echo 11/21 > EF 30%, G2DD. Flex laryngoscopy 11/27 > negative. 01/03/2018 MRI of the brain with 3 subcentimeter infarcts in cerebellar region left  Micro Data:  Urine 11/5 > enterococcus faecalis. Blood 11/22 > negative. Urine 11/23 > enterococcus faecalis. Blood 12/2 > no growth to  date>>NEG Sputum 12/2 > Moderate gram negative rods>> Klebsiella pneumoniae resistant to Zosyn  Antimicrobials:  Ceftriaxone 11/5 > 11/6. Ampicillin 11/6 > 11/8. Zosyn 11/24 > 11/29.  12/2 > 12/3 01/03/2018 meropenem>> 01/10/2018  Interim history/subjective:  No acute issues.  No reason why she can transfer to progressive care unit and to [facility] hospitalist service Objective:  Blood pressure (!) 158/88, pulse (!) 105, temperature 98.1 F (36.7 C), temperature source Axillary, resp. rate 18, height 5\' 3"  (1.6 m), weight 59 kg, SpO2 100 %.    FiO2 (%):  [30 %-32 %] 32 %   Intake/Output Summary (Last 24 hours) at 01/09/2018 0815 Last data filed at 01/09/2018 0600 Gross per 24 hour  Intake 975.98 ml  Output 1150 ml  Net -174.02 ml   Filed Weights   12/28/17 0452 12/29/17 0300 12/31/17 0600  Weight: 58.1 kg 58 kg 59 kg    Examination: General: Frail elderly female in no acute distress HEENT: No JVD or lymphadenopathy is appreciated Neuro: Intermittently follows commands, appears confused CV: Sounds are regular PULM: even/non-labored, lungs bilaterally and base PJ:KDTO, non-tender, bsx4 active, PEG tube in place Extremities: warm/dry, negative edema  Skin: Sacral area with first stage breakdown noted      Assessment & Plan:  Acute hypoxemic respiratory failure - presumed r/t pulmonary edema in setting acute on chronic combined heart failure as well as klebsiella PNA +/- aspiration. Extubated 12/6. Intermittent bipap needs.  #Klebsiella pneumonia ESBL #Concern for Aspiration PLAN -  Currently on nocturnal BiPAP and PRN Continue diuresis monitor renal function The day 7 of 7 of meropenem for Klebsiella pneumonia Chest x-ray  Left temporal and frontal infarcts #  Acute encephalopathy - unclear etiology.   MRI 73/03/2023 with embolic-looking strokes.  Blood cultures negative. TTE with no evidence of endocarditis. No seizure activity on EEG PLAN -  Supportive care Neurology  signed off  Hypokalemia Hypernatremia Monitor electrolytes 01/09/2018 start free water for hypernatremia  Enterococcus UTI Status post treatment with Zosyn  Decompensated combined heart failure  NSTEMI - s/p heparin gtt. Echo from 11/21 with EF 30% and G2DD. Echo 12/6 with diffuse hypokinesis with EF 30%, mod-severe MR PLAN -  Diuresis as tolerated Plavix  Sacral breakdown Continue current care  CKD -creatinine being stable at 1.68 PLAN -  Renal has been involved Note on daily Lasix  DM II. PLAN -  Sliding scale insulin  Dysphagia PLAN -  PEG in place Continue tube feeding  Anemia.  Drifting down 01/09/2018 is 7.1 PLAN -  Transfuse per protocol Note she remains on Plavix   FTT  With underlying neurological issues, advanced age, findings consistent with vascular dementia.  Family is opted for full code at this time.  And continued to optimize care as able.  Poor prognosis.   Best Practice:  Diet: TF's via PEG currently at goal Pain/Anxiety/Delirium protocol (if indicated): Intermittent fentanyl/versed. VAP protocol (if indicated): In place. DVT prophylaxis: SCD's. GI prophylaxis: Pantoprazole. Glucose control: SSI. Mobility: Bedrest. Code Status: Full. Family Communication: 01/09/2018 no family at bedside Disposition: Remain in ICU  Labs   CBC: Recent Labs  Lab 01/05/18 0510 01/06/18 0637 01/07/18 1810 01/08/18 0536 01/09/18 0452  WBC 14.9* 17.1* 12.8* 11.9* 11.0*  NEUTROABS 12.3* 15.4* 10.7* 9.6* 9.1*  HGB 8.5* 8.7* 7.9* 7.3* 7.1*  HCT 28.4* 29.0* 26.9* 24.9* 25.0*  MCV 95.9 96.7 98.2 98.0 99.6  PLT 212 283 231 214 427   Basic Metabolic Panel: Recent Labs  Lab 01/04/18 0602 01/05/18 0510 01/06/18 0637 01/07/18 1810 01/08/18 0536 01/09/18 0452  NA 143 144 146* 147* 149* 150*  K 3.1* 3.7 3.4* 3.5 3.4* 4.0  CL 106 107 110 112* 112* 113*  CO2 26 26 24 25 26 29   GLUCOSE 164* 124* 164* 191* 149* 177*  BUN 41* 41* 42* 51* 53* 61*    CREATININE 1.99* 1.91* 1.80* 1.60* 1.58* 1.68*  CALCIUM 8.2* 8.2* 8.7* 8.9 8.8* 9.0  MG 1.9 2.0 1.9  --  2.0 2.1  PHOS 3.5 2.9 3.2  --  3.1 3.3   GFR: Estimated Creatinine Clearance: 19.1 mL/min (A) (by C-G formula based on SCr of 1.68 mg/dL (H)). Recent Labs  Lab 01/03/18 0252  01/06/18 0637 01/07/18 1810 01/08/18 0536 01/09/18 0452  PROCALCITON 1.09  --   --   --   --   --   WBC 16.0*   < > 17.1* 12.8* 11.9* 11.0*   < > = values in this interval not displayed.   Liver Function Tests: Recent Labs  Lab 01/03/18 0252  AST 17  ALT 12  ALKPHOS 48  BILITOT 0.9  PROT 5.6*  ALBUMIN 1.5*   No results for input(s): LIPASE, AMYLASE in the last 168 hours. No results for input(s): AMMONIA in the last 168 hours. ABG    Component Value Date/Time   PHART 7.482 (H) 01/03/2018 0420   PCO2ART 42.2 01/03/2018 0420   PO2ART 151.0 (H) 01/03/2018 0420   HCO3 31.5 (H) 01/03/2018 0420   TCO2 33 (H) 01/03/2018 0420   ACIDBASEDEF 2.8 (H) 12/20/2017 2235   O2SAT 99.0 01/03/2018 0420    Coagulation Profile: No results for input(s): INR, PROTIME in the  last 168 hours. Cardiac Enzymes: Recent Labs  Lab 01/03/18 0252  TROPONINI 0.66*   CBG: Recent Labs  Lab 01/08/18 1146 01/08/18 1556 01/08/18 2024 01/08/18 2355 01/09/18 0435  GLUCAP 122* 105* 113* 135* 126*    Intake/Output Summary (Last 24 hours) at 01/09/2018 0815 Last data filed at 01/09/2018 0600 Gross per 24 hour  Intake 975.98 ml  Output 1150 ml  Net -174.02 ml      Richardson Landry  ACNP [facility] PCCM Pager [phone] till 1 pm If no answer page [phone] 01/09/2018, 8:15 AM

## 2018-01-09 NOTE — Progress Notes (Signed)
1700 Daughter at bedside, called RN to room. Daughter concerned about patients blood pressure and her breathing. Paged Dr. Vaughan Browner. 12 lead EKG performed and troponin drawn. RT called to bedside to put patient back on Bipap. Dr. Vaughan Browner to bedside to speak with Daughter.

## 2018-01-10 ENCOUNTER — Inpatient Hospital Stay (HOSPITAL_COMMUNITY): Payer: Medicare Other

## 2018-01-10 LAB — CBC WITH DIFFERENTIAL/PLATELET
Abs Immature Granulocytes: 0.04 10*3/uL (ref 0.00–0.07)
Basophils Absolute: 0 10*3/uL (ref 0.0–0.1)
Basophils Relative: 0 %
Eosinophils Absolute: 0.2 10*3/uL (ref 0.0–0.5)
Eosinophils Relative: 2 %
HCT: 24.5 % — ABNORMAL LOW (ref 36.0–46.0)
Hemoglobin: 7.1 g/dL — ABNORMAL LOW (ref 12.0–15.0)
Immature Granulocytes: 0 %
Lymphocytes Relative: 11 %
Lymphs Abs: 1.1 10*3/uL (ref 0.7–4.0)
MCH: 28.4 pg (ref 26.0–34.0)
MCHC: 29 g/dL — ABNORMAL LOW (ref 30.0–36.0)
MCV: 98 fL (ref 80.0–100.0)
MONOS PCT: 7 %
Monocytes Absolute: 0.7 10*3/uL (ref 0.1–1.0)
Neutro Abs: 8.1 10*3/uL — ABNORMAL HIGH (ref 1.7–7.7)
Neutrophils Relative %: 80 %
Platelets: 182 10*3/uL (ref 150–400)
RBC: 2.5 MIL/uL — AB (ref 3.87–5.11)
RDW: 16.3 % — ABNORMAL HIGH (ref 11.5–15.5)
WBC: 10.2 10*3/uL (ref 4.0–10.5)
nRBC: 0 % (ref 0.0–0.2)

## 2018-01-10 LAB — TROPONIN I
Troponin I: 0.27 ng/mL (ref <0.03)
Troponin I: 0.26 ng/mL (ref <0.03)
Troponin I: 0.25 ng/mL (ref <0.03)

## 2018-01-10 LAB — BASIC METABOLIC PANEL
Anion gap: 9 (ref 5–15)
BUN: 64 mg/dL — ABNORMAL HIGH (ref 8–23)
CO2: 26 mmol/L (ref 22–32)
Calcium: 9.2 mg/dL (ref 8.9–10.3)
Chloride: 115 mmol/L — ABNORMAL HIGH (ref 98–111)
Creatinine, Ser: 1.59 mg/dL — ABNORMAL HIGH (ref 0.44–1.00)
GFR calc Af Amer: 33 mL/min — ABNORMAL LOW (ref >60)
GFR, EST NON AFRICAN AMERICAN: 29 mL/min — AB (ref >60)
Glucose, Bld: 103 mg/dL — ABNORMAL HIGH (ref 70–99)
Potassium: 3.8 mmol/L (ref 3.5–5.1)
Sodium: 150 mmol/L — ABNORMAL HIGH (ref 135–145)

## 2018-01-10 LAB — GLUCOSE, CAPILLARY
Glucose-Capillary: 105 mg/dL — ABNORMAL HIGH (ref 70–99)
Glucose-Capillary: 115 mg/dL — ABNORMAL HIGH (ref 70–99)
Glucose-Capillary: 123 mg/dL — ABNORMAL HIGH (ref 70–99)
Glucose-Capillary: 66 mg/dL — ABNORMAL LOW (ref 70–99)
Glucose-Capillary: 175 mg/dL — ABNORMAL HIGH (ref 70–99)
Glucose-Capillary: 67 mg/dL — ABNORMAL LOW (ref 70–99)
Glucose-Capillary: 117 mg/dL — ABNORMAL HIGH (ref 70–99)
Glucose-Capillary: 114 mg/dL — ABNORMAL HIGH (ref 70–99)
Glucose-Capillary: 139 mg/dL — ABNORMAL HIGH (ref 70–99)

## 2018-01-10 LAB — PHOSPHORUS: Phosphorus: 3.6 mg/dL (ref 2.5–4.6)

## 2018-01-10 LAB — MAGNESIUM: Magnesium: 1.9 mg/dL (ref 1.7–2.4)

## 2018-01-10 MED ORDER — FREE WATER
250.0000 mL | Freq: Four times a day (QID) | Status: DC
  Administered 2018-01-10 – 2018-01-11 (×4): 250 mL

## 2018-01-10 MED ORDER — METOCLOPRAMIDE HCL 10 MG/10ML PO SOLN
10.0000 mg | Freq: Four times a day (QID) | ORAL | Status: DC
  Administered 2018-01-10 – 2018-01-12 (×8): 10 mg
  Filled 2018-01-10 (×11): qty 10

## 2018-01-10 MED ORDER — INSULIN DETEMIR 100 UNIT/ML ~~LOC~~ SOLN
10.0000 [IU] | Freq: Every day | SUBCUTANEOUS | Status: DC
  Administered 2018-01-11 – 2018-01-15 (×5): 10 [IU] via SUBCUTANEOUS
  Filled 2018-01-10 (×5): qty 0.1

## 2018-01-10 NOTE — Progress Notes (Signed)
Pt lying comfortably in bed, no distress-  No need for BIPAP indicated at time of check.

## 2018-01-10 NOTE — Progress Notes (Addendum)
PROGRESS NOTE    [name]  KXF:[id number] DOB: [birth year] DOA: 12/05/2017 PCP: Deland Pretty, MD    Brief Narrative:  84 year old female who presented with altered mental status.  She does have significant past medical history for type 2 diabetes mellitus, hypertension, dyslipidemia coronary disease status post bypass grafting, history of CVA chronic kidney disease stage III.  Recently diagnosed with achalasia, is being treated with Botox with no significant improvement of her symptoms.  Patient was noted to have difficulty speaking, finding words, that progressed into total aphasia.  Pressure 139/76, respiratory rate16, her lungs are clear to auscultation bilaterally, heart S1-S2 present, rhythmic, abdomen soft nontender, no lower extremity edema, she was noted to have dysarthria, not able to follow commands.   She had a prolonged and complicated hospital stay.  November 10 she had a J-tube placed, November 20 rapid response due to volume overload, diagnosed with non-STEMI with systolic heart failure.  December 2, patient placed on invasive mechanical ventilation due to respiratory distress, extubated December 6.   Assessment & Plan:   Principal Problem:   Dysarthria Active Problems:   Diabetes mellitus ([place])   Hyperlipidemia   Essential hypertension   Coronary atherosclerosis   Stroke (cerebrum) (HCC)   Stage 3 chronic kidney disease ([place])   Acute lower UTI   Adult failure to thrive   UTI (urinary tract infection)   DNR (do not resuscitate) discussion   Palliative care by specialist   CHF (congestive heart failure) ([place])   Aspiration pneumonia due to gastric secretions (HCC)   NSTEMI (non-ST elevated myocardial infarction) ([place])   Altered mental status   Acute on chronic respiratory failure with hypoxia ([place])  1.  Acute hypoxemic respiratory failure due to aspiration pneumonia and pulmonary edema. Patient refused to use bipap last night, this am noted to have increase work of  breathing, will use bipap during the day as needed and all night if tolerated. Continue diuresis to keep negative fluid balance, patient has completed antibiotic therapy with meropenem for Kleb ESBL.   2. Left temporal and frontal CVA, complicated with metabolic encephalopathy. Will continue neuro checks per unit protocol, physical therapy and speech therapy. Continue atorvastatin and clopidogrel.   3. Acute on chronic diastolic heart failure. Will continue diuresis with furosemide, continue blood pressure monitoring. Continue metoprolol.   4. CKD stage 3 with hypernatremia. Continue to follow on renal panel and electrolytes, avoid hypotension and nephrotoxic medications. Stable renal function with serum cr at 1,5 with K at 3,8 and serum bicarbonate at 26. Will increase free water flushes for hypernatremia 150. Continue furosemide for now.   5. Chronic anemia. Multifactorial. Stable hb and hct, Follow on cell count in am.   6. Chronic dysphagia with moderate protein calorie malnutrition. Continue nutritional supplements with tube feedings.   7. T2DM. Will continue glucose cover and monitoring with iss and insulin levimir 10 units daily to prevent hypoglycemia.    DVT prophylaxis: enoxaparin   Code Status:  full Family Communication: I spoke with patient's family at the bedside and all questions were addressed.  Disposition Plan/ discharge barriers: pending clinical improvement.   Body mass index is 21.36 kg/m. Malnutrition Type:  Nutrition Problem: Moderate Malnutrition Etiology: chronic illness(dysphagia 2/2 to achalasia)   Malnutrition Characteristics:  Signs/Symptoms: energy intake < or equal to 75% for > or equal to 1 month, mild fat depletion, moderate fat depletion, mild muscle depletion, moderate muscle depletion, energy intake < or equal to 50% for > or equal to 1  month Percent weight loss: 36 %   Nutrition Interventions:  Interventions: Magic cup, MVI, Refer to RD note  for recommendations  RN Pressure Injury Documentation: Pressure Injury 11/04/17 Stage II -  Partial thickness loss of dermis presenting as a shallow open ulcer with a red, pink wound bed without slough. (Active)  11/04/17 1530   Location: Buttocks  Location Orientation: Left  Staging: Stage II -  Partial thickness loss of dermis presenting as a shallow open ulcer with a red, pink wound bed without slough.  Wound Description (Comments):   Present on Admission: Yes     Consultants:     Procedures:     Antimicrobials:       Subjective: Patient this am with signs of respiratory distress, with increase use of accessory muscles, last night on nasal cannula. Her daughter is at the bedside and gave all information, patient continue to be aphasic.   Objective: Vitals:   01/10/18 0405 01/10/18 0500 01/10/18 0511 01/10/18 0700  BP: (!) 118/52   (!) 111/56  Pulse: 89  92 95  Resp: 17  (!) 23 (!) 21  Temp:   98.7 F (37.1 C) 99.1 F (37.3 C)  TempSrc:   Axillary Axillary  SpO2: 100%  96% 100%  Weight:  54.7 kg    Height:        Intake/Output Summary (Last 24 hours) at 01/10/2018 0829 Last data filed at 01/10/2018 0500 Gross per 24 hour  Intake 1340.75 ml  Output 650 ml  Net 690.75 ml   Filed Weights   12/31/17 0600 01/09/18 2200 01/10/18 0500  Weight: 59 kg 60.1 kg 54.7 kg    Examination:   General: deconditioned and ill looking appearing  Neurology: Awake, not verbal.  E ENT: positive pallor, no icterus, oral mucosa moist/ positive accessory muscle use.  Cardiovascular: No JVD. S1-S2 present, rhythmic, no gallops, rubs, or murmurs. No lower extremity edema. Pulmonary: decreased breath sounds bilaterally, poor inspiratory effort, no wheezing, rhonchi or rales. Gastrointestinal. Abdomen with no organomegaly, non tender, no rebound or guarding/ peg tube in place.  Skin. No rashes Musculoskeletal: no joint deformities     Data Reviewed: I have personally reviewed  following labs and imaging studies  CBC: Recent Labs  Lab 01/06/18 0637 01/07/18 1810 01/08/18 0536 01/09/18 0452 01/10/18 0558  WBC 17.1* 12.8* 11.9* 11.0* 10.2  NEUTROABS 15.4* 10.7* 9.6* 9.1* 8.1*  HGB 8.7* 7.9* 7.3* 7.1* 7.1*  HCT 29.0* 26.9* 24.9* 25.0* 24.5*  MCV 96.7 98.2 98.0 99.6 98.0  PLT 283 231 214 199 786   Basic Metabolic Panel: Recent Labs  Lab 01/05/18 0510 01/06/18 0637 01/07/18 1810 01/08/18 0536 01/09/18 0452 01/10/18 0558  NA 144 146* 147* 149* 150* 150*  K 3.7 3.4* 3.5 3.4* 4.0 3.8  CL 107 110 112* 112* 113* 115*  CO2 26 24 25 26 29 26   GLUCOSE 124* 164* 191* 149* 177* 103*  BUN 41* 42* 51* 53* 61* 64*  CREATININE 1.91* 1.80* 1.60* 1.58* 1.68* 1.59*  CALCIUM 8.2* 8.7* 8.9 8.8* 9.0 9.2  MG 2.0 1.9  --  2.0 2.1 1.9  PHOS 2.9 3.2  --  3.1 3.3 3.6   GFR: Estimated Creatinine Clearance: 20.2 mL/min (A) (by C-G formula based on SCr of 1.59 mg/dL (H)). Liver Function Tests: No results for input(s): AST, ALT, ALKPHOS, BILITOT, PROT, ALBUMIN in the last 168 hours. No results for input(s): LIPASE, AMYLASE in the last 168 hours. No results for input(s): AMMONIA in the last  168 hours. Coagulation Profile: No results for input(s): INR, PROTIME in the last 168 hours. Cardiac Enzymes: Recent Labs  Lab 01/09/18 1751 01/10/18 0001 01/10/18 0558  TROPONINI 0.28* 0.27* 0.26*   BNP (last 3 results) No results for input(s): PROBNP in the last 8760 hours. HbA1C: No results for input(s): HGBA1C in the last 72 hours. CBG: Recent Labs  Lab 01/09/18 1549 01/09/18 2019 01/09/18 2156 01/10/18 0522 01/10/18 0742  GLUCAP 91 97 110* 105* 115*   Lipid Profile: No results for input(s): CHOL, HDL, LDLCALC, TRIG, CHOLHDL, LDLDIRECT in the last 72 hours. Thyroid Function Tests: No results for input(s): TSH, T4TOTAL, FREET4, T3FREE, THYROIDAB in the last 72 hours. Anemia Panel: No results for input(s): VITAMINB12, FOLATE, FERRITIN, TIBC, IRON, RETICCTPCT in the  last 72 hours.    Radiology Studies: I have reviewed all of the imaging during this hospital visit personally     Scheduled Meds: . sodium chloride   Intravenous Once  . atorvastatin  40 mg Per Tube q1800  . chlorhexidine  15 mL Mouth Rinse BID  . cholecalciferol  400 Units Per Tube Daily  . clopidogrel  75 mg Per Tube Daily  . donepezil  5 mg Oral QHS  . ferrous sulfate  300 mg Per Tube Daily  . free water  200 mL Per Tube Q8H  . furosemide  40 mg Per Tube Daily  . insulin aspart  0-15 Units Subcutaneous Q4H  . insulin detemir  10 Units Subcutaneous BID  . levalbuterol  0.63 mg Nebulization BID   And  . ipratropium  0.5 mg Nebulization BID  . mouth rinse  15 mL Mouth Rinse q12n4p  . metoCLOPramide (REGLAN) injection  5 mg Intravenous Q6H  . metoprolol tartrate  50 mg Per Tube BID  . multivitamin  15 mL Per Tube Daily  . nitroGLYCERIN  0.3 mg Transdermal Daily  . pantoprazole sodium  40 mg Per Tube Q1200  . sennosides  5 mL Per Tube QHS   Continuous Infusions: . sodium chloride Stopped (01/08/18 2158)  . feeding supplement (VITAL AF 1.2 CAL) 45 mL/hr at 01/10/18 0500     LOS: 32 days        Mauricio Gerome Apley, MD [facility] Pager [phone]

## 2018-01-10 NOTE — Progress Notes (Signed)
Patient placed on bipap per family's request. Pt in no distress.

## 2018-01-10 NOTE — Progress Notes (Signed)
  Speech Language Pathology Treatment: Dysphagia  Patient Details Name: [name] MRN: [id number] DOB: 1927/12/22 Today's Date: 01/10/2018 Time: 3825-0539 SLP Time Calculation (min) (ACUTE ONLY): 12 min  Assessment / Plan / Recommendation Clinical Impression  Pt has been transferred to 2W; on 3L nasal cannula.  Pt arouses to name.  Repositioned in bed with HOB elevated as much as pt will allow to optimize swallowing.  Pt followed simple commands to extend tongue, open mouth.  She received ice chips, but continues to demonstrate poor awareness of their presence bilaterally in mouth, with max verbal/tactile cues needed to elicit chewing/propulsion.  Portions of ice eventually, passively, spill from mouth. Pt minimally engaged; she is not ready to safely resume POs given poor awareness.  No family present - it is unknown what family's long-term wishes are with regard to eating and drinking. In addition, given NPO status, pt needs oral suctioning unit set-up in room.  Discussed with RN.   SLP will follow for PO readiness.    HPI HPI: 84 y.o. female with CAD s/p CABG, recent ischemic stroke, HTN, DM, HLP, recent failure to thrive due to swallowing problems s/p botox injection for achalasia, developed acute pulmonary edema and NSTEMI on 12/20/2017. Bedside swallow on 11/05/17 revealed functional oropharyngeal swallow and suspected esophageal dysphagia. Had esophagram 11/06/17 which showed moderately large hiatal hernia with poor motility and retained barium throughout the esophagus. PEG 12/10/17.  Repeat bedside swallow eval 12/23/17 with recs for NPO secondary to poor MS, no oral anticipation/recognition of POs. Pt with no change in MS that would allow safe oral intake between 11/23 and date of intubation,12/2.  Extubated 12/6. New orders for repeat bedside swallow eval s/p extubation.      SLP Plan  Continue with current plan of care       Recommendations  Diet recommendations: NPO Medication  Administration: Via alternative means                Oral Care Recommendations: Oral care QID Follow up Recommendations: Other (comment)(tbd) SLP Visit Diagnosis: Dysphagia, unspecified (R13.10) Plan: Continue with current plan of care       Berg[place]. Tivis Ringer, [place] CCC/SLP Acute Rehabilitation Services Office number [phone] Pager [phone]   Juan Quam Laurice 01/10/2018, 2:32 PM

## 2018-01-10 NOTE — Progress Notes (Signed)
Patient on 3L 02 off the BIPAP. Patient resting comfortably at this tiime. BIPAP is on standby at bedside.

## 2018-01-10 NOTE — Progress Notes (Addendum)
Pt had CBG of 66 and 67 when taken for lunch. RN gave an amp of D50, and will recheck CBG in 15 mins. CBG recheck 175. PT tolerated well, will continue to monitor.  Riley Kill RN

## 2018-01-10 NOTE — Progress Notes (Signed)
Pt placed on BIPAP for the night-family at bedside

## 2018-01-11 ENCOUNTER — Inpatient Hospital Stay (HOSPITAL_COMMUNITY): Payer: Medicare Other

## 2018-01-11 LAB — CBC WITH DIFFERENTIAL/PLATELET
Abs Immature Granulocytes: 0.05 10*3/uL (ref 0.00–0.07)
BASOS PCT: 0 %
Basophils Absolute: 0 10*3/uL (ref 0.0–0.1)
EOS PCT: 1 %
Eosinophils Absolute: 0.1 10*3/uL (ref 0.0–0.5)
HCT: 26.8 % — ABNORMAL LOW (ref 36.0–46.0)
Hemoglobin: 7.6 g/dL — ABNORMAL LOW (ref 12.0–15.0)
IMMATURE GRANULOCYTES: 1 %
Lymphocytes Relative: 8 %
Lymphs Abs: 0.7 10*3/uL (ref 0.7–4.0)
MCH: 28 pg (ref 26.0–34.0)
MCHC: 28.4 g/dL — AB (ref 30.0–36.0)
MCV: 98.9 fL (ref 80.0–100.0)
Monocytes Absolute: 0.5 10*3/uL (ref 0.1–1.0)
Monocytes Relative: 6 %
Neutro Abs: 7.3 10*3/uL (ref 1.7–7.7)
Neutrophils Relative %: 84 %
Platelets: 175 10*3/uL (ref 150–400)
RBC: 2.71 MIL/uL — ABNORMAL LOW (ref 3.87–5.11)
RDW: 16.4 % — ABNORMAL HIGH (ref 11.5–15.5)
WBC: 8.7 10*3/uL (ref 4.0–10.5)
nRBC: 0 % (ref 0.0–0.2)

## 2018-01-11 LAB — BASIC METABOLIC PANEL
Anion gap: 11 (ref 5–15)
BUN: 63 mg/dL — ABNORMAL HIGH (ref 8–23)
CO2: 28 mmol/L (ref 22–32)
CREATININE: 1.69 mg/dL — AB (ref 0.44–1.00)
Calcium: 9 mg/dL (ref 8.9–10.3)
Chloride: 111 mmol/L (ref 98–111)
GFR calc Af Amer: 31 mL/min — ABNORMAL LOW (ref >60)
GFR calc non Af Amer: 27 mL/min — ABNORMAL LOW (ref >60)
Glucose, Bld: 187 mg/dL — ABNORMAL HIGH (ref 70–99)
POTASSIUM: 3.7 mmol/L (ref 3.5–5.1)
Sodium: 150 mmol/L — ABNORMAL HIGH (ref 135–145)

## 2018-01-11 LAB — GLUCOSE, CAPILLARY
Glucose-Capillary: 202 mg/dL — ABNORMAL HIGH (ref 70–99)
Glucose-Capillary: 170 mg/dL — ABNORMAL HIGH (ref 70–99)
Glucose-Capillary: 218 mg/dL — ABNORMAL HIGH (ref 70–99)
Glucose-Capillary: 201 mg/dL — ABNORMAL HIGH (ref 70–99)
Glucose-Capillary: 107 mg/dL — ABNORMAL HIGH (ref 70–99)

## 2018-01-11 MED ORDER — FUROSEMIDE 10 MG/ML IJ SOLN
40.0000 mg | Freq: Once | INTRAMUSCULAR | Status: AC
  Administered 2018-01-11: 40 mg via INTRAVENOUS
  Filled 2018-01-11: qty 4

## 2018-01-11 MED ORDER — HEPARIN SODIUM (PORCINE) 5000 UNIT/ML IJ SOLN
5000.0000 [IU] | Freq: Three times a day (TID) | INTRAMUSCULAR | Status: DC
  Administered 2018-01-11 – 2018-01-17 (×19): 5000 [IU] via SUBCUTANEOUS
  Filled 2018-01-11 (×18): qty 1

## 2018-01-11 MED ORDER — MORPHINE SULFATE (PF) 2 MG/ML IV SOLN
1.0000 mg | INTRAVENOUS | Status: DC | PRN
  Administered 2018-01-11 (×2): 1 mg via INTRAVENOUS
  Filled 2018-01-11 (×2): qty 1

## 2018-01-11 MED ORDER — FUROSEMIDE 10 MG/ML IJ SOLN
40.0000 mg | Freq: Every day | INTRAMUSCULAR | Status: DC
  Administered 2018-01-12: 40 mg via INTRAVENOUS
  Filled 2018-01-11: qty 4

## 2018-01-11 NOTE — Progress Notes (Signed)
Patient has been combative, chest x-ray with persistent pulmonary edema, patient now back on BiPAP with hemodynamic stability, but patient still trying to take off full face mask.  Low-dose morphine as needed to tolerate noninvasive mechanical ventilation.  Continue aggressive diuresis with IV furosemide.  Patient continued to be very poor prognosis due to comorbidities and poor muscle reserve.   I personally called his son over the phone and updated him about patient's condition.

## 2018-01-11 NOTE — Progress Notes (Signed)
Pt's daughter called RN into room c/o pt having chest pain. Pts HR tachy at 114. Pt having increased SOB. RN gave SL nitro, and RT placed BiPaP on pt. Daughter ok with pt getting fentanyl 25 mcg. Pt is stable and RN will continue to monitor.  Riley Kill RN

## 2018-01-11 NOTE — Progress Notes (Signed)
PROGRESS NOTE    [name]  UKG:[id number] DOB: [birth year] DOA: 12/05/2017 PCP: Deland Pretty, MD    Brief Narrative:  84 year old female who presented with altered mental status.  She does have significant past medical history for type 2 diabetes mellitus, hypertension, dyslipidemia coronary disease status post bypass grafting, history of CVA chronic kidney disease stage III.  Recently diagnosed with achalasia, is being treated with Botox with no significant improvement of her symptoms.  Patient was noted to have difficulty speaking, finding words, that progressed into total aphasia.  On her initial physical examination her blood pressure was 139/76, respiratory rate16, her lungs were clear to auscultation bilaterally, heart S1-S2 present, rhythmic, abdomen soft nontender, no lower extremity edema, she was noted to have dysarthria, not able to follow commands.   She had a prolonged and complicated hospital stay.  November 10 she had a J-tube placed, November 20 rapid response due to volume overload, diagnosed with non-STEMI with systolic heart failure.  December 2, patient placed on invasive mechanical ventilation due to respiratory distress, extubated December 6. Currently patient very weak and deconditioned, continue to need Bipap intermittent during the day and continuous at night.    Assessment & Plan:   Principal Problem:   Dysarthria Active Problems:   Diabetes mellitus ([place])   Hyperlipidemia   Essential hypertension   Coronary atherosclerosis   Stroke (cerebrum) (HCC)   Stage 3 chronic kidney disease ([place])   Acute lower UTI   Adult failure to thrive   UTI (urinary tract infection)   DNR (do not resuscitate) discussion   Palliative care by specialist   CHF (congestive heart failure) ([place])   Aspiration pneumonia due to gastric secretions (HCC)   NSTEMI (non-ST elevated myocardial infarction) ([place])   Altered mental status   Acute on chronic respiratory failure with hypoxia  ([place])  1.  Acute hypoxemic respiratory failure due to aspiration pneumonia and pulmonary edema. Patient with respiratory distress, with accessory muscle use and encephalopathy. Follow chest film with persistent pulmonary edema, will change furosemide to IV for better diuresis, and will continue non invasive mechanical ventilation, will add low dose morphine to improve compliance with full face mask bipap. Strict in and outs. Patient has completed antibiotic therapy.   2. Left temporal and frontal CVA, complicated with metabolic encephalopathy. Continue to be nonverbal, very weak and deconditioned, will continue neuro checks per unit protocol and aspiration precautions. Currently on atorvastatin and clopidogrel.   3. Acute on chronic diastolic heart failure. Continue diuresis with furosemide, Continue metoprolol for now. Continue telemetry monitoring.    4. CKD stage 3 with hypernatremia. Persistent hyeperNatremia, renal function with stable serum cr at 1,69 with K at 3,7 and serum bicarbonate at 28, will continue diuresis with furosemide for volume overload, will follow on renal panel in am, avoid hypotension and nephrotoxic medications. Patient with persistent hypernatremia, will have to hold on free water flushes due to volume overload.   5. Chronic anemia. Multifactorial. Hb at 7,6 with Hct at 26,8, no indication for prbc transfusion.   6. Chronic dysphagia with moderate protein calorie malnutrition. On nutritional supplements with tube feedings, will need to optimize nutrition. Patient with very poor prognosis.   7. T2DM. Glucose cover and monitoring with iss and insulin levimir 10 units daily to prevent hypoglycemia. Capillary glucose 139, 202, 170, 218, 201. Continue tube feedings.    DVT prophylaxis: enoxaparin   Code Status:  full Family Communication: I spoke with patient's daughter and son (phone)  at the  bedside and all questions were addressed.  Disposition Plan/ discharge  barriers: pending clinical improvement.    Body mass index is 23.82 kg/m. Malnutrition Type:  Nutrition Problem: Moderate Malnutrition Etiology: chronic illness(dysphagia 2/2 to achalasia)   Malnutrition Characteristics:  Signs/Symptoms: energy intake < or equal to 75% for > or equal to 1 month, mild fat depletion, moderate fat depletion, mild muscle depletion, moderate muscle depletion, energy intake < or equal to 50% for > or equal to 1 month Percent weight loss: 36 %   Nutrition Interventions:  Interventions: Magic cup, MVI, Refer to RD note for recommendations  RN Pressure Injury Documentation: Pressure Injury 11/04/17 Stage II -  Partial thickness loss of dermis presenting as a shallow open ulcer with a red, pink wound bed without slough. (Active)  11/04/17 1530  Location: Buttocks  Location Orientation: Left  Staging: Stage II -  Partial thickness loss of dermis presenting as a shallow open ulcer with a red, pink wound bed without slough.  Wound Description (Comments):   Present on Admission: Yes     Consultants:     Procedures:     Antimicrobials:       Subjective: Patient continue to have persistent respiratory distress off Bipap, not verbal, has been not ambulatory. Tolerating well tube feedings. Patient has been puling out face mask/ bipap.   Objective: Vitals:   01/11/18 0419 01/11/18 0608 01/11/18 0732 01/11/18 0750  BP: (!) 165/98 134/72 139/77   Pulse: (!) 110  98 98  Resp: (!) 27  17 19   Temp: 97.8 F (36.6 C)  98.7 F (37.1 C)   TempSrc: Axillary  Oral   SpO2: 97%  100%   Weight:  61 kg    Height:        Intake/Output Summary (Last 24 hours) at 01/11/2018 1000 Last data filed at 01/11/2018 0000 Gross per 24 hour  Intake 3015 ml  Output -  Net 3015 ml   Filed Weights   01/09/18 2200 01/10/18 0500 01/11/18 0608  Weight: 60.1 kg 54.7 kg 61 kg    Examination:   General: deconditioned  Neurology: Awake and alert, non focal  E  ENT: mild pallor, no icterus, oral mucosa moist Cardiovascular: Positive JVD. S1-S2 present, rhythmic, no gallops, rubs, or murmurs. Trace lower extremity edema. Pulmonary: decreased breath sounds bilaterally, poor air movement, no wheezing, rhonchi or rales. Gastrointestinal. Abdomen with no organomegaly, non tender, no rebound or guarding Skin. No rashes Musculoskeletal: no joint deformities     Data Reviewed: I have personally reviewed following labs and imaging studies  CBC: Recent Labs  Lab 01/07/18 1810 01/08/18 0536 01/09/18 0452 01/10/18 0558 01/11/18 0613  WBC 12.8* 11.9* 11.0* 10.2 8.7  NEUTROABS 10.7* 9.6* 9.1* 8.1* 7.3  HGB 7.9* 7.3* 7.1* 7.1* 7.6*  HCT 26.9* 24.9* 25.0* 24.5* 26.8*  MCV 98.2 98.0 99.6 98.0 98.9  PLT 231 214 199 182 614   Basic Metabolic Panel: Recent Labs  Lab 01/05/18 0510 01/06/18 0637 01/07/18 1810 01/08/18 0536 01/09/18 0452 01/10/18 0558 01/11/18 0613  NA 144 146* 147* 149* 150* 150* 150*  K 3.7 3.4* 3.5 3.4* 4.0 3.8 3.7  CL 107 110 112* 112* 113* 115* 111  CO2 26 24 25 26 29 26 28   GLUCOSE 124* 164* 191* 149* 177* 103* 187*  BUN 41* 42* 51* 53* 61* 64* 63*  CREATININE 1.91* 1.80* 1.60* 1.58* 1.68* 1.59* 1.69*  CALCIUM 8.2* 8.7* 8.9 8.8* 9.0 9.2 9.0  MG 2.0 1.9  --  2.0 2.1 1.9  --   PHOS 2.9 3.2  --  3.1 3.3 3.6  --    GFR: Estimated Creatinine Clearance: 19 mL/min (A) (by C-G formula based on SCr of 1.69 mg/dL (H)). Liver Function Tests: No results for input(s): AST, ALT, ALKPHOS, BILITOT, PROT, ALBUMIN in the last 168 hours. No results for input(s): LIPASE, AMYLASE in the last 168 hours. No results for input(s): AMMONIA in the last 168 hours. Coagulation Profile: No results for input(s): INR, PROTIME in the last 168 hours. Cardiac Enzymes: Recent Labs  Lab 01/09/18 1751 01/10/18 0001 01/10/18 0558 01/10/18 1427  TROPONINI 0.28* 0.27* 0.26* 0.25*   BNP (last 3 results) No results for input(s): PROBNP in the last  8760 hours. HbA1C: No results for input(s): HGBA1C in the last 72 hours. CBG: Recent Labs  Lab 01/10/18 1708 01/10/18 1955 01/10/18 2303 01/11/18 0425 01/11/18 0847  GLUCAP 117* 114* 139* 202* 170*   Lipid Profile: No results for input(s): CHOL, HDL, LDLCALC, TRIG, CHOLHDL, LDLDIRECT in the last 72 hours. Thyroid Function Tests: No results for input(s): TSH, T4TOTAL, FREET4, T3FREE, THYROIDAB in the last 72 hours. Anemia Panel: No results for input(s): VITAMINB12, FOLATE, FERRITIN, TIBC, IRON, RETICCTPCT in the last 72 hours.    Radiology Studies: I have reviewed all of the imaging during this hospital visit personally     Scheduled Meds: . sodium chloride   Intravenous Once  . atorvastatin  40 mg Per Tube q1800  . chlorhexidine  15 mL Mouth Rinse BID  . cholecalciferol  400 Units Per Tube Daily  . clopidogrel  75 mg Per Tube Daily  . donepezil  5 mg Oral QHS  . ferrous sulfate  300 mg Per Tube Daily  . free water  250 mL Per Tube Q6H  . furosemide  40 mg Per Tube Daily  . heparin injection (subcutaneous)  5,000 Units Subcutaneous Q8H  . insulin aspart  0-15 Units Subcutaneous Q4H  . insulin detemir  10 Units Subcutaneous Daily  . levalbuterol  0.63 mg Nebulization BID   And  . ipratropium  0.5 mg Nebulization BID  . mouth rinse  15 mL Mouth Rinse q12n4p  . metoCLOPramide  10 mg Per Tube Q6H  . metoprolol tartrate  50 mg Per Tube BID  . multivitamin  15 mL Per Tube Daily  . nitroGLYCERIN  0.3 mg Transdermal Daily  . pantoprazole sodium  40 mg Per Tube Q1200  . sennosides  5 mL Per Tube QHS   Continuous Infusions: . sodium chloride Stopped (01/08/18 2158)  . feeding supplement (VITAL AF 1.2 CAL) 1,000 mL (01/10/18 1621)     LOS: 33 days        Anijah Spohr Gerome Apley, MD [facility] Pager [phone]

## 2018-01-11 NOTE — Progress Notes (Signed)
Per RN pt continues to pull Bipap mask off. Pt on RA tolerating well at this time.  Daughter at bedside, RTwill continue to monitor

## 2018-01-11 NOTE — Progress Notes (Signed)
On arrival to pt's room RN & pt's daughter at bedside.  Daughter states pt is complaining of chest pain.  Pt does seem to have increased WOB.  Pt placed on Bipap at this time and is tolerating well, RT will continue to monitor

## 2018-01-12 LAB — GLUCOSE, CAPILLARY
Glucose-Capillary: 138 mg/dL — ABNORMAL HIGH (ref 70–99)
Glucose-Capillary: 144 mg/dL — ABNORMAL HIGH (ref 70–99)
Glucose-Capillary: 145 mg/dL — ABNORMAL HIGH (ref 70–99)
Glucose-Capillary: 149 mg/dL — ABNORMAL HIGH (ref 70–99)
Glucose-Capillary: 180 mg/dL — ABNORMAL HIGH (ref 70–99)
Glucose-Capillary: 233 mg/dL — ABNORMAL HIGH (ref 70–99)
Glucose-Capillary: 94 mg/dL (ref 70–99)

## 2018-01-12 LAB — OSMOLALITY: Osmolality: 340 mOsm/kg (ref 275–295)

## 2018-01-12 LAB — SODIUM, URINE, RANDOM: Sodium, Ur: 61 mmol/L

## 2018-01-12 LAB — OSMOLALITY, URINE: Osmolality, Ur: 464 mOsm/kg (ref 300–900)

## 2018-01-12 MED ORDER — METOCLOPRAMIDE HCL 5 MG/5ML PO SOLN
5.0000 mg | Freq: Four times a day (QID) | ORAL | Status: DC
  Administered 2018-01-12 (×3): 5 mg via ORAL
  Filled 2018-01-12 (×6): qty 5

## 2018-01-12 NOTE — Progress Notes (Signed)
Physical Therapy Treatment Patient Details Name: [name] MRN: [id number] DOB: 06/10/1927 Today's Date: 01/12/2018    History of Present Illness 84 yo female admitted on 12/05/17 with onset of UTI and AMS was admitted with ongoing weight loss and dehydration with FTT.  S/p G-tube placement on 12/10/17 with ongoing confusion.  Dx with acute respiratory failure with hypoxia due to pulmonary edema, NSTEMI possibly due to volume overload 11/21, acute HF, acute metabolic encepholopathy (per neurology: 3 punctate left temproal and left frontal infarcts, etiology unclear,  but will not explain pt encephalopathy), aspiration PNA 11/23, anemia (acute on chronic), sacral pressure wound.  Respiratory distress on 11/25 with pt combative vs. lethargy.  VDRF 12/2-12/6.  On Bipap on 12/7.  PMHx:  CAD, AKI, angina, cardiomegaly, CABG, CVA with L hemi, CKD 3, transfusion, pleural effusion, DM, achalasia with botox procedure.    PT Comments    Pt was seen for attempt to sit up but was so lethargic that she could not get up.  Instead began to work with her legs and noted her gradual increase in alertness with there ex.  Will continue to see her for mobility and progression of movement as tolerated, and progress the effort to standing when ready.   Follow Up Recommendations  SNF;Supervision/Assistance - 24 hour     Equipment Recommendations  Hospital bed;Other (comment)    Recommendations for Other Services       Precautions / Restrictions Precautions Precautions: Fall Precaution Comments: coccygeal wound, Prevalon boots/ Peg 11/10 with abdominal binder, geo mat Restrictions Weight Bearing Restrictions: No    Mobility  Bed Mobility Overal bed mobility: Needs Assistance Bed Mobility: (scooting up bed)           General bed mobility comments: max assist of 1-2 to scoot up bed  Transfers Overall transfer level: Needs assistance               General transfer comment: unable to move pt  due to lethargy  Ambulation/Gait                 Stairs             Wheelchair Mobility    Modified Rankin (Stroke Patients Only)       Balance                                            Cognition Arousal/Alertness: Awake/alert Behavior During Therapy: Flat affect Overall Cognitive Status: Impaired/Different from baseline Area of Impairment: Following commands;Safety/judgement;Awareness;Problem solving                 Orientation Level: Situation Current Attention Level: Selective Memory: Decreased recall of precautions;Decreased short-term memory Following Commands: Follows one step commands with increased time Safety/Judgement: Decreased awareness of safety;Decreased awareness of deficits Awareness: Intellectual Problem Solving: Slow processing;Difficulty sequencing;Requires verbal cues        Exercises General Exercises - Lower Extremity Ankle Circles/Pumps: AAROM;Both;5 reps Heel Slides: AAROM;Both;10 reps Hip ABduction/ADduction: AAROM;Both;10 reps Straight Leg Raises: AAROM;Both;10 reps Hip Flexion/Marching: AAROM;Both;10 reps    General Comments        Pertinent Vitals/Pain Pain Assessment: Faces Faces Pain Scale: No hurt    Home Living                      Prior Function  PT Goals (current goals can now be found in the care plan section) Acute Rehab PT Goals Patient Stated Goal: unable to state Progress towards PT goals: Progressing toward goals    Frequency    Min 2X/week      PT Plan Current plan remains appropriate    Co-evaluation              AM-PAC PT "6 Clicks" Mobility   Outcome Measure  Help needed turning from your back to your side while in a flat bed without using bedrails?: Total Help needed moving from lying on your back to sitting on the side of a flat bed without using bedrails?: Total Help needed moving to and from a bed to a chair (including a  wheelchair)?: Total Help needed standing up from a chair using your arms (e.g., wheelchair or bedside chair)?: Total Help needed to walk in hospital room?: Total Help needed climbing 3-5 steps with a railing? : Total 6 Click Score: 6    End of Session Equipment Utilized During Treatment: Oxygen Activity Tolerance: Patient tolerated treatment well Patient left: with call bell/phone within reach;with chair alarm set;in bed Nurse Communication: Mobility status;Need for lift equipment PT Visit Diagnosis: Muscle weakness (generalized) (M62.81);Other abnormalities of gait and mobility (R26.89);Adult, failure to thrive (R62.7);Hemiplegia and hemiparesis Hemiplegia - Right/Left: Left Hemiplegia - dominant/non-dominant: Non-dominant Hemiplegia - caused by: Unspecified     Time: 2446-2863 PT Time Calculation (min) (ACUTE ONLY): 27 min  Charges:  $Therapeutic Exercise: 8-22 mins $Therapeutic Activity: 8-22 mins                    Ramond Dial 01/12/2018, 6:46 PM   Mee Hives, PT MS Acute Rehab Dept. Number: [place] [facility] [place]

## 2018-01-12 NOTE — Progress Notes (Signed)
PROGRESS NOTE    [name]  ZOX:[id number] DOB: [birth year] DOA: 12/05/2017 PCP: Deland Pretty, MD    Brief Narrative:  84 year old female who presented with altered mental status.  She does have significant past medical history for type 2 diabetes mellitus, hypertension, dyslipidemia coronary disease status post bypass grafting, history of CVA chronic kidney disease stage III.  Recently diagnosed with achalasia, is being treated with Botox with no significant improvement of her symptoms.  Patient was noted to have difficulty speaking, finding words, that progressed into total aphasia.  On her initial physical examination her blood pressure was 139/76, respiratory rate16, her lungs were clear to auscultation bilaterally, heart S1-S2 present, rhythmic, abdomen soft nontender, no lower extremity edema, she was noted to have dysarthria, not able to follow commands.   She had a prolonged and complicated hospital stay.  November 10 she had a J-tube placed, November 20 rapid response due to volume overload, diagnosed with non-STEMI with systolic heart failure.  December 2, patient placed on invasive mechanical ventilation due to respiratory distress, extubated December 6. Currently patient very weak and deconditioned, continue to need Bipap intermittent during the day and continuous at night.  01/12/2018: Patient looks chronically ill.  No history from patient.  Check urine sodium, urine osmolality and plasma osmolality.  Sodium is 150 today.  Worsening serum creatinine.  Will hold IV Lasix for now.  Low albumin noted.  We will also consult palliative care.  Prognosis is poor.   Assessment & Plan:   Principal Problem:   Dysarthria Active Problems:   Diabetes mellitus ([place])   Hyperlipidemia   Essential hypertension   Coronary atherosclerosis   Stroke (cerebrum) (HCC)   Stage 3 chronic kidney disease ([place])   Acute lower UTI   Adult failure to thrive   UTI (urinary tract infection)   DNR (do  not resuscitate) discussion   Palliative care by specialist   CHF (congestive heart failure) ([place])   Aspiration pneumonia due to gastric secretions (HCC)   NSTEMI (non-ST elevated myocardial infarction) ([place])   Altered mental status   Acute on chronic respiratory failure with hypoxia ([place])  1.  Acute hypoxemic respiratory failure due to aspiration pneumonia and pulmonary edema. Patient with respiratory distress, with accessory muscle use and encephalopathy. Follow chest film with persistent pulmonary edema, will change furosemide to IV for better diuresis, and will continue non invasive mechanical ventilation, will add low dose morphine to improve compliance with full face mask bipap. Strict in and outs. Patient has completed antibiotic therapy. 01/12/2018: Continue to monitor closely.  Patient remains at risk for aspiration.  2. Left temporal and frontal CVA, complicated with metabolic encephalopathy. Continue to be nonverbal, very weak and deconditioned, will continue neuro checks per unit protocol and aspiration precautions. Currently on atorvastatin and clopidogrel.   3. Acute on chronic diastolic heart failure. Continue diuresis with furosemide, Continue metoprolol for now. Continue telemetry monitoring.    4. CKD stage 3 with hypernatremia. Persistent hyeperNatremia, renal function with stable serum cr at 1,69 with K at 3,7 and serum bicarbonate at 28, will continue diuresis with furosemide for volume overload, will follow on renal panel in am, avoid hypotension and nephrotoxic medications. Patient with persistent hypernatremia, will have to hold on free water flushes due to volume overload. 01/12/2018: Hold IV Lasix for now.  Check urine osmolality and sodium, as well as plasma osmolality.  Further management depend on hospital course.  5. Chronic anemia. Multifactorial. Hb at 7,6 with Hct at 26,8, no  indication for prbc transfusion.   6. Chronic dysphagia with moderate protein calorie  malnutrition. On nutritional supplements with tube feedings, will need to optimize nutrition. Patient with very poor prognosis.   7. T2DM. Glucose cover and monitoring with iss and insulin levimir 10 units daily to prevent hypoglycemia. Capillary glucose 139, 202, 170, 218, 201. Continue tube feedings.    DVT prophylaxis: enoxaparin   Code Status:  full Family Communication: I spoke with patient's daughter and son (phone)  at the bedside and all questions were addressed.  Disposition Plan/ discharge barriers: pending clinical improvement.    Body mass index is 23.82 kg/m. Malnutrition Type:  Nutrition Problem: Moderate Malnutrition Etiology: chronic illness(dysphagia 2/2 to achalasia)   Malnutrition Characteristics:  Signs/Symptoms: energy intake < or equal to 75% for > or equal to 1 month, mild fat depletion, moderate fat depletion, mild muscle depletion, moderate muscle depletion, energy intake < or equal to 50% for > or equal to 1 month Percent weight loss: 36 %   Nutrition Interventions:  Interventions: Magic cup, MVI, Refer to RD note for recommendations  RN Pressure Injury Documentation: Pressure Injury 11/04/17 Stage II -  Partial thickness loss of dermis presenting as a shallow open ulcer with a red, pink wound bed without slough. (Active)  11/04/17 1530  Location: Buttocks  Location Orientation: Left  Staging: Stage II -  Partial thickness loss of dermis presenting as a shallow open ulcer with a red, pink wound bed without slough.  Wound Description (Comments):   Present on Admission: Yes     Consultants:     Procedures:     Antimicrobials:       Subjective: No history from patient. Seen alongside patient's Nurse. No collateral informant.   Objective: Vitals:   01/12/18 0735 01/12/18 0800 01/12/18 0900 01/12/18 1000  BP: 139/82     Pulse: (!) 108 90 88 77  Resp: (!) 26 18 12 18   Temp: 97.8 F (36.6 C)     TempSrc: Axillary     SpO2: 99%  100% 100% 100%  Weight:      Height:        Intake/Output Summary (Last 24 hours) at 01/12/2018 1246 Last data filed at 01/12/2018 1000 Gross per 24 hour  Intake 1263.75 ml  Output 580 ml  Net 683.75 ml   Filed Weights   01/10/18 0500 01/11/18 0608 01/12/18 0500  Weight: 54.7 kg 61 kg 61 kg    Examination:   General: Chronically ill looking.  Neurology: Awake. Follows command to some extent. Aphasic.   E ENT: mild pallor, no icterus, Could not assess oral mucosa. Cardiovascular: S1 S2. Minimal edema. Pulmonary: decreased air entry globally Gastrointestinal. Abdomen with no organomegaly, non tender, no rebound or guarding   Data Reviewed: I have personally reviewed following labs and imaging studies  CBC: Recent Labs  Lab 01/07/18 1810 01/08/18 0536 01/09/18 0452 01/10/18 0558 01/11/18 0613  WBC 12.8* 11.9* 11.0* 10.2 8.7  NEUTROABS 10.7* 9.6* 9.1* 8.1* 7.3  HGB 7.9* 7.3* 7.1* 7.1* 7.6*  HCT 26.9* 24.9* 25.0* 24.5* 26.8*  MCV 98.2 98.0 99.6 98.0 98.9  PLT 231 214 199 182 096   Basic Metabolic Panel: Recent Labs  Lab 01/06/18 0637 01/07/18 1810 01/08/18 0536 01/09/18 0452 01/10/18 0558 01/11/18 0613  NA 146* 147* 149* 150* 150* 150*  K 3.4* 3.5 3.4* 4.0 3.8 3.7  CL 110 112* 112* 113* 115* 111  CO2 24 25 26 29 26 28   GLUCOSE 164* 191* 149*  177* 103* 187*  BUN 42* 51* 53* 61* 64* 63*  CREATININE 1.80* 1.60* 1.58* 1.68* 1.59* 1.69*  CALCIUM 8.7* 8.9 8.8* 9.0 9.2 9.0  MG 1.9  --  2.0 2.1 1.9  --   PHOS 3.2  --  3.1 3.3 3.6  --    GFR: Estimated Creatinine Clearance: 19 mL/min (A) (by C-G formula based on SCr of 1.69 mg/dL (H)). Liver Function Tests: No results for input(s): AST, ALT, ALKPHOS, BILITOT, PROT, ALBUMIN in the last 168 hours. No results for input(s): LIPASE, AMYLASE in the last 168 hours. No results for input(s): AMMONIA in the last 168 hours. Coagulation Profile: No results for input(s): INR, PROTIME in the last 168 hours. Cardiac  Enzymes: Recent Labs  Lab 01/09/18 1751 01/10/18 0001 01/10/18 0558 01/10/18 1427  TROPONINI 0.28* 0.27* 0.26* 0.25*   BNP (last 3 results) No results for input(s): PROBNP in the last 8760 hours. HbA1C: No results for input(s): HGBA1C in the last 72 hours. CBG: Recent Labs  Lab 01/11/18 2020 01/12/18 0021 01/12/18 0418 01/12/18 0817 01/12/18 1206  GLUCAP 107* 138* 144* 180* 233*   Lipid Profile: No results for input(s): CHOL, HDL, LDLCALC, TRIG, CHOLHDL, LDLDIRECT in the last 72 hours. Thyroid Function Tests: No results for input(s): TSH, T4TOTAL, FREET4, T3FREE, THYROIDAB in the last 72 hours. Anemia Panel: No results for input(s): VITAMINB12, FOLATE, FERRITIN, TIBC, IRON, RETICCTPCT in the last 72 hours.    Radiology Studies: I have reviewed all of the imaging during this hospital visit personally     Scheduled Meds: . sodium chloride   Intravenous Once  . atorvastatin  40 mg Per Tube q1800  . chlorhexidine  15 mL Mouth Rinse BID  . cholecalciferol  400 Units Per Tube Daily  . clopidogrel  75 mg Per Tube Daily  . donepezil  5 mg Oral QHS  . ferrous sulfate  300 mg Per Tube Daily  . furosemide  40 mg Intravenous Daily  . heparin injection (subcutaneous)  5,000 Units Subcutaneous Q8H  . insulin aspart  0-15 Units Subcutaneous Q4H  . insulin detemir  10 Units Subcutaneous Daily  . levalbuterol  0.63 mg Nebulization BID   And  . ipratropium  0.5 mg Nebulization BID  . mouth rinse  15 mL Mouth Rinse q12n4p  . metoCLOPramide  5 mg Oral Q6H  . metoprolol tartrate  50 mg Per Tube BID  . multivitamin  15 mL Per Tube Daily  . nitroGLYCERIN  0.3 mg Transdermal Daily  . pantoprazole sodium  40 mg Per Tube Q1200  . sennosides  5 mL Per Tube QHS   Continuous Infusions: . sodium chloride Stopped (01/08/18 2158)  . feeding supplement (VITAL AF 1.2 CAL) 45 mL/hr at 01/12/18 1000     LOS: 34 days    Bonnell Public, MD [facility] Pager [phone]

## 2018-01-12 NOTE — Progress Notes (Signed)
  Speech Language Pathology Treatment: Dysphagia  Patient Details Name: [name] MRN: [id number] DOB: August 04, 1927 Today's Date: 01/12/2018 Time: 5427-0623 SLP Time Calculation (min) (ACUTE ONLY): 18 min  Assessment / Plan / Recommendation Clinical Impression  Pt's mentation is still impaired for PO intake, although she did swallow minimal amounts of POs today. She orally held an ice chip until it melted, allowing it to spill passively into her pharynx without triggering a swallow until she coughed. SLP then provided small amounts of water with swifter oral transit and swallow trigger, but still requiring Max cues for alertness and awareness. Risk for aspiration remains high, and she likely would only be able to take in minimal amounts of POs. Recommend that she remain NPO at this time. Discussed suction equipment with RN, who states she will set this up.   HPI HPI: 84 y.o. female with CAD s/p CABG, recent ischemic stroke, HTN, DM, HLP, recent failure to thrive due to swallowing problems s/p botox injection for achalasia, developed acute pulmonary edema and NSTEMI on 12/20/2017. Bedside swallow on 11/05/17 revealed functional oropharyngeal swallow and suspected esophageal dysphagia. Had esophagram 11/06/17 which showed moderately large hiatal hernia with poor motility and retained barium throughout the esophagus. PEG 12/10/17.  Repeat bedside swallow eval 12/23/17 with recs for NPO secondary to poor MS, no oral anticipation/recognition of POs. Pt with no change in MS that would allow safe oral intake between 11/23 and date of intubation,12/2.  Extubated 12/6. New orders for repeat bedside swallow eval s/p extubation.      SLP Plan  Continue with current plan of care       Recommendations  Diet recommendations: NPO Medication Administration: Via alternative means                Oral Care Recommendations: Oral care QID Follow up Recommendations: Skilled Nursing facility SLP Visit  Diagnosis: Dysphagia, unspecified (R13.10) Plan: Continue with current plan of care       GO                [name] 01/12/2018, 10:23 AM  [name], M.A. [place] Acute Environmental education officer [phone] Office [phone]

## 2018-01-13 ENCOUNTER — Inpatient Hospital Stay (HOSPITAL_COMMUNITY): Payer: Medicare Other

## 2018-01-13 LAB — GLUCOSE, CAPILLARY
Glucose-Capillary: 173 mg/dL — ABNORMAL HIGH (ref 70–99)
Glucose-Capillary: 183 mg/dL — ABNORMAL HIGH (ref 70–99)
Glucose-Capillary: 223 mg/dL — ABNORMAL HIGH (ref 70–99)
Glucose-Capillary: 218 mg/dL — ABNORMAL HIGH (ref 70–99)
Glucose-Capillary: 155 mg/dL — ABNORMAL HIGH (ref 70–99)

## 2018-01-13 LAB — CBC WITH DIFFERENTIAL/PLATELET
Abs Immature Granulocytes: 0.04 10*3/uL (ref 0.00–0.07)
Basophils Absolute: 0.1 10*3/uL (ref 0.0–0.1)
Basophils Relative: 1 %
Eosinophils Absolute: 0.1 10*3/uL (ref 0.0–0.5)
Eosinophils Relative: 1 %
HCT: 24.5 % — ABNORMAL LOW (ref 36.0–46.0)
Hemoglobin: 7 g/dL — ABNORMAL LOW (ref 12.0–15.0)
Immature Granulocytes: 1 %
Lymphocytes Relative: 11 %
Lymphs Abs: 0.9 10*3/uL (ref 0.7–4.0)
MCH: 28.9 pg (ref 26.0–34.0)
MCHC: 28.6 g/dL — ABNORMAL LOW (ref 30.0–36.0)
MCV: 101.2 fL — ABNORMAL HIGH (ref 80.0–100.0)
Monocytes Absolute: 0.5 10*3/uL (ref 0.1–1.0)
Monocytes Relative: 6 %
Neutro Abs: 7.2 10*3/uL (ref 1.7–7.7)
Neutrophils Relative %: 80 %
Platelets: 178 10*3/uL (ref 150–400)
RBC: 2.42 MIL/uL — ABNORMAL LOW (ref 3.87–5.11)
RDW: 16.8 % — ABNORMAL HIGH (ref 11.5–15.5)
WBC: 8.9 10*3/uL (ref 4.0–10.5)
nRBC: 0 % (ref 0.0–0.2)

## 2018-01-13 LAB — RENAL FUNCTION PANEL
Albumin: 1.6 g/dL — ABNORMAL LOW (ref 3.5–5.0)
Anion gap: 9 (ref 5–15)
BUN: 75 mg/dL — ABNORMAL HIGH (ref 8–23)
CO2: 32 mmol/L (ref 22–32)
Calcium: 9.1 mg/dL (ref 8.9–10.3)
Chloride: 109 mmol/L (ref 98–111)
Creatinine, Ser: 1.74 mg/dL — ABNORMAL HIGH (ref 0.44–1.00)
GFR calc Af Amer: 30 mL/min — ABNORMAL LOW (ref >60)
GFR calc non Af Amer: 26 mL/min — ABNORMAL LOW (ref >60)
Glucose, Bld: 217 mg/dL — ABNORMAL HIGH (ref 70–99)
Phosphorus: 3.4 mg/dL (ref 2.5–4.6)
Potassium: 3.9 mmol/L (ref 3.5–5.1)
Sodium: 150 mmol/L — ABNORMAL HIGH (ref 135–145)

## 2018-01-13 MED ORDER — METOCLOPRAMIDE HCL 5 MG PO TABS
5.0000 mg | ORAL_TABLET | Freq: Four times a day (QID) | ORAL | Status: DC
  Administered 2018-01-13 – 2018-01-15 (×9): 5 mg via ORAL
  Filled 2018-01-13 (×9): qty 1

## 2018-01-13 NOTE — Plan of Care (Signed)
  Problem: Education: Goal: Knowledge of General Education information will improve Description Including pain rating scale, medication(s)/side effects and non-pharmacologic comfort measures  Outcome: Not Progressing   Problem: Health Behavior/Discharge Planning: Goal: Ability to manage health-related needs will improve Outcome: Not Progressing   Problem: Clinical Measurements: Goal: Ability to maintain clinical measurements within normal limits will improve 01/13/2018 0045 by Kayleen Memos, RN Outcome: Not Progressing 01/13/2018 0044 by Kayleen Memos, RN Outcome: Not Progressing Goal: Will remain free from infection Outcome: Not Progressing Goal: Diagnostic test results will improve Outcome: Not Progressing Goal: Respiratory complications will improve Outcome: Not Progressing

## 2018-01-13 NOTE — Progress Notes (Signed)
PROGRESS NOTE    [name]  WJX:[id number] DOB: 1927/10/04 DOA: 12/05/2017 PCP: Deland Pretty, MD    Brief Narrative:  84 year old female who presented with altered mental status.  She does have significant past medical history for type 2 diabetes mellitus, hypertension, dyslipidemia coronary disease status post bypass grafting, history of CVA chronic kidney disease stage III.  Recently diagnosed with achalasia, is being treated with Botox with no significant improvement of her symptoms.  Patient was noted to have difficulty speaking, finding words, that progressed into total aphasia.  On her initial physical examination her blood pressure was 139/76, respiratory rate16, her lungs were clear to auscultation bilaterally, heart S1-S2 present, rhythmic, abdomen soft nontender, no lower extremity edema, she was noted to have dysarthria, not able to follow commands.   She had a prolonged and complicated hospital stay.  November 10 she had a J-tube placed, November 20 rapid response due to volume overload, diagnosed with non-STEMI with systolic heart failure.  December 2, patient placed on invasive mechanical ventilation due to respiratory distress, extubated December 6. Currently patient very weak and deconditioned, continue to need Bipap intermittent during the day and continuous at night.  01/12/2018: Patient looks chronically ill.  No history from patient.  Check urine sodium, urine osmolality and plasma osmolality.  Sodium is 150 today.  Worsening serum creatinine.  Will hold IV Lasix for now.  Low albumin noted.  We will also consult palliative care.  Prognosis is poor.  01/13/2018: Continue to hold Lasix for now.  Repeat chest x-ray.  Sodium remains at 150.  Patient seems more interactive today, but will now still follow commands.   Assessment & Plan:   Principal Problem:   Dysarthria Active Problems:   Diabetes mellitus ([place])   Hyperlipidemia   Essential hypertension   Coronary  atherosclerosis   Stroke (cerebrum) (HCC)   Stage 3 chronic kidney disease ([place])   Acute lower UTI   Adult failure to thrive   UTI (urinary tract infection)   DNR (do not resuscitate) discussion   Palliative care by specialist   CHF (congestive heart failure) ([place])   Aspiration pneumonia due to gastric secretions (HCC)   NSTEMI (non-ST elevated myocardial infarction) ([place])   Altered mental status   Acute on chronic respiratory failure with hypoxia ([place])  1.  Acute hypoxemic respiratory failure due to aspiration pneumonia and pulmonary edema. Patient with respiratory distress, with accessory muscle use and encephalopathy. Follow chest film with persistent pulmonary edema, will change furosemide to IV for better diuresis, and will continue non invasive mechanical ventilation, will add low dose morphine to improve compliance with full face mask bipap. Strict in and outs. Patient has completed antibiotic therapy. 01/13/2018: Continue to monitor closely.  Patient remains at risk for aspiration.  Continue to hold diuretics.  Repeat chest x-ray.  Further management will depend on hospital course.  2. Left temporal and frontal CVA, complicated with metabolic encephalopathy. Continue to be nonverbal, very weak and deconditioned, will continue neuro checks per unit protocol and aspiration precautions. Currently on atorvastatin and clopidogrel.   3. Acute on chronic diastolic heart failure.  Diuretics is on hold.  Continue metoprolol for now. Continue telemetry monitoring.    4. CKD stage 3 with hypernatremia. Persistent hyeperNatremia, renal function with stable serum cr at 1,69 with K at 3,7 and serum bicarbonate at 28, will continue diuresis with furosemide for volume overload, will follow on renal panel in am, avoid hypotension and nephrotoxic medications. Patient with persistent hypernatremia, will  have to hold on free water flushes due to volume overload. 01/13/2018: Continue to hold IV Lasix for  now.  Further management depend on hospital course.  5. Chronic anemia. Multifactorial. Hb at 7,6 with Hct at 26,8, no indication for prbc transfusion.   6. Chronic dysphagia with moderate protein calorie malnutrition. On nutritional supplements with tube feedings, will need to optimize nutrition. Patient with very poor prognosis.   7. T2DM. Glucose cover and monitoring with iss and insulin levimir 10 units daily to prevent hypoglycemia. Capillary glucose 139, 202, 170, 218, 201. Continue tube feedings.    DVT prophylaxis: enoxaparin   Code Status:  full Family Communication: I spoke with patient's daughter and son (phone)  at the bedside and all questions were addressed.  Disposition Plan/ discharge barriers: pending clinical improvement.    Body mass index is 21.4 kg/m. Malnutrition Type:  Nutrition Problem: Moderate Malnutrition Etiology: chronic illness(dysphagia 2/2 to achalasia)   Malnutrition Characteristics:  Signs/Symptoms: energy intake < or equal to 75% for > or equal to 1 month, mild fat depletion, moderate fat depletion, mild muscle depletion, moderate muscle depletion, energy intake < or equal to 50% for > or equal to 1 month Percent weight loss: 36 %   Nutrition Interventions:  Interventions: Magic cup, MVI, Refer to RD note for recommendations  RN Pressure Injury Documentation: Pressure Injury 11/04/17 Stage II -  Partial thickness loss of dermis presenting as a shallow open ulcer with a red, pink wound bed without slough. (Active)  11/04/17 1530  Location: Buttocks  Location Orientation: Left  Staging: Stage II -  Partial thickness loss of dermis presenting as a shallow open ulcer with a red, pink wound bed without slough.  Wound Description (Comments):   Present on Admission: Yes     Consultants:     Procedures:     Antimicrobials:       Subjective: No history from patient.   Objective: Vitals:   01/13/18 0723 01/13/18 0801  01/13/18 1147 01/13/18 1500  BP: 131/68  (!) 141/72 (!) 148/77  Pulse: 85  85 83  Resp: (!) 23  (!) 26 (!) 24  Temp: 98.2 F (36.8 C)  98.1 F (36.7 C) 97.9 F (36.6 C)  TempSrc: Oral  Oral Oral  SpO2: 100% 99% 100% 100%  Weight:      Height:        Intake/Output Summary (Last 24 hours) at 01/13/2018 1733 Last data filed at 01/13/2018 0600 Gross per 24 hour  Intake 360 ml  Output 801 ml  Net -441 ml   Filed Weights   01/11/18 0608 01/12/18 0500 01/13/18 0600  Weight: 61 kg 61 kg 54.8 kg    Examination:   General: Chronically ill looking.  Neurology: Awake. Follows command to some extent. Aphasic.   E ENT: mild pallor, no icterus, Could not assess oral mucosa. Cardiovascular: S1 S2.   Pulmonary: decreased air entry globally Gastrointestinal. Abdomen with no organomegaly, non tender, no rebound or guarding   Data Reviewed: I have personally reviewed following labs and imaging studies  CBC: Recent Labs  Lab 01/08/18 0536 01/09/18 0452 01/10/18 0558 01/11/18 0613 01/13/18 0642  WBC 11.9* 11.0* 10.2 8.7 8.9  NEUTROABS 9.6* 9.1* 8.1* 7.3 7.2  HGB 7.3* 7.1* 7.1* 7.6* 7.0*  HCT 24.9* 25.0* 24.5* 26.8* 24.5*  MCV 98.0 99.6 98.0 98.9 101.2*  PLT 214 199 182 175 361   Basic Metabolic Panel: Recent Labs  Lab 01/08/18 0536 01/09/18 0452 01/10/18 0558 01/11/18 4431  01/13/18 0642  NA 149* 150* 150* 150* 150*  K 3.4* 4.0 3.8 3.7 3.9  CL 112* 113* 115* 111 109  CO2 26 29 26 28  32  GLUCOSE 149* 177* 103* 187* 217*  BUN 53* 61* 64* 63* 75*  CREATININE 1.58* 1.68* 1.59* 1.69* 1.74*  CALCIUM 8.8* 9.0 9.2 9.0 9.1  MG 2.0 2.1 1.9  --   --   PHOS 3.1 3.3 3.6  --  3.4   GFR: Estimated Creatinine Clearance: 18.5 mL/min (A) (by C-G formula based on SCr of 1.74 mg/dL (H)). Liver Function Tests: Recent Labs  Lab 01/13/18 0642  ALBUMIN 1.6*   No results for input(s): LIPASE, AMYLASE in the last 168 hours. No results for input(s): AMMONIA in the last 168  hours. Coagulation Profile: No results for input(s): INR, PROTIME in the last 168 hours. Cardiac Enzymes: Recent Labs  Lab 01/09/18 1751 01/10/18 0001 01/10/18 0558 01/10/18 1427  TROPONINI 0.28* 0.27* 0.26* 0.25*   BNP (last 3 results) No results for input(s): PROBNP in the last 8760 hours. HbA1C: No results for input(s): HGBA1C in the last 72 hours. CBG: Recent Labs  Lab 01/12/18 2345 01/13/18 0422 01/13/18 0725 01/13/18 1144 01/13/18 1542  GLUCAP 145* 173* 183* 223* 218*   Lipid Profile: No results for input(s): CHOL, HDL, LDLCALC, TRIG, CHOLHDL, LDLDIRECT in the last 72 hours. Thyroid Function Tests: No results for input(s): TSH, T4TOTAL, FREET4, T3FREE, THYROIDAB in the last 72 hours. Anemia Panel: No results for input(s): VITAMINB12, FOLATE, FERRITIN, TIBC, IRON, RETICCTPCT in the last 72 hours.    Radiology Studies: I have reviewed all of the imaging during this hospital visit personally     Scheduled Meds: . sodium chloride   Intravenous Once  . atorvastatin  40 mg Per Tube q1800  . chlorhexidine  15 mL Mouth Rinse BID  . cholecalciferol  400 Units Per Tube Daily  . clopidogrel  75 mg Per Tube Daily  . donepezil  5 mg Oral QHS  . ferrous sulfate  300 mg Per Tube Daily  . heparin injection (subcutaneous)  5,000 Units Subcutaneous Q8H  . insulin aspart  0-15 Units Subcutaneous Q4H  . insulin detemir  10 Units Subcutaneous Daily  . levalbuterol  0.63 mg Nebulization BID   And  . ipratropium  0.5 mg Nebulization BID  . mouth rinse  15 mL Mouth Rinse q12n4p  . metoCLOPramide  5 mg Oral Q6H  . metoprolol tartrate  50 mg Per Tube BID  . multivitamin  15 mL Per Tube Daily  . nitroGLYCERIN  0.3 mg Transdermal Daily  . pantoprazole sodium  40 mg Per Tube Q1200  . sennosides  5 mL Per Tube QHS   Continuous Infusions: . sodium chloride Stopped (01/08/18 2158)  . feeding supplement (VITAL AF 1.2 CAL) 45 mL/hr at 01/12/18 1800     LOS: 90 days     Bonnell Public, MD [facility] Pager [phone]

## 2018-01-13 NOTE — Plan of Care (Signed)
  Problem: Clinical Measurements: Goal: Ability to maintain clinical measurements within normal limits will improve Outcome: Not Progressing Goal: Will remain free from infection Outcome: Not Progressing Goal: Diagnostic test results will improve Outcome: Not Progressing Goal: Respiratory complications will improve Outcome: Not Progressing

## 2018-01-13 NOTE — Progress Notes (Signed)
Patient son declined BIPAP for patient. Stated that patient is doing well and that they have weaned her off the BIPAP. Respiratory notified and oxygen placed on patient. Patient responds only to voice and non verbal. Vitals stable. Son at the bedside. Patient repositioned and mouth care provided. Asked son to notify staff with any needs. Will continue to monitor.

## 2018-01-14 LAB — CBC WITH DIFFERENTIAL/PLATELET
Abs Immature Granulocytes: 0.03 10*3/uL (ref 0.00–0.07)
Basophils Absolute: 0 10*3/uL (ref 0.0–0.1)
Basophils Relative: 0 %
Eosinophils Absolute: 0.1 10*3/uL (ref 0.0–0.5)
Eosinophils Relative: 1 %
HCT: 25.6 % — ABNORMAL LOW (ref 36.0–46.0)
Hemoglobin: 7.4 g/dL — ABNORMAL LOW (ref 12.0–15.0)
Immature Granulocytes: 0 %
Lymphocytes Relative: 11 %
Lymphs Abs: 1 10*3/uL (ref 0.7–4.0)
MCH: 28.9 pg (ref 26.0–34.0)
MCHC: 28.9 g/dL — ABNORMAL LOW (ref 30.0–36.0)
MCV: 100 fL (ref 80.0–100.0)
Monocytes Absolute: 0.6 10*3/uL (ref 0.1–1.0)
Monocytes Relative: 7 %
Neutro Abs: 7.7 10*3/uL (ref 1.7–7.7)
Neutrophils Relative %: 81 %
Platelets: 200 10*3/uL (ref 150–400)
RBC: 2.56 MIL/uL — ABNORMAL LOW (ref 3.87–5.11)
RDW: 17.1 % — ABNORMAL HIGH (ref 11.5–15.5)
WBC: 9.5 10*3/uL (ref 4.0–10.5)
nRBC: 0 % (ref 0.0–0.2)

## 2018-01-14 LAB — RENAL FUNCTION PANEL
Albumin: 1.7 g/dL — ABNORMAL LOW (ref 3.5–5.0)
Anion gap: 11 (ref 5–15)
BUN: 84 mg/dL — ABNORMAL HIGH (ref 8–23)
CO2: 30 mmol/L (ref 22–32)
Calcium: 9.4 mg/dL (ref 8.9–10.3)
Chloride: 110 mmol/L (ref 98–111)
Creatinine, Ser: 1.86 mg/dL — ABNORMAL HIGH (ref 0.44–1.00)
GFR calc Af Amer: 28 mL/min — ABNORMAL LOW (ref >60)
GFR calc non Af Amer: 24 mL/min — ABNORMAL LOW (ref >60)
Glucose, Bld: 246 mg/dL — ABNORMAL HIGH (ref 70–99)
Phosphorus: 3.2 mg/dL (ref 2.5–4.6)
Potassium: 4 mmol/L (ref 3.5–5.1)
Sodium: 151 mmol/L — ABNORMAL HIGH (ref 135–145)

## 2018-01-14 LAB — GLUCOSE, CAPILLARY
Glucose-Capillary: 157 mg/dL — ABNORMAL HIGH (ref 70–99)
Glucose-Capillary: 230 mg/dL — ABNORMAL HIGH (ref 70–99)
Glucose-Capillary: 231 mg/dL — ABNORMAL HIGH (ref 70–99)
Glucose-Capillary: 246 mg/dL — ABNORMAL HIGH (ref 70–99)

## 2018-01-14 LAB — MAGNESIUM: Magnesium: 2.3 mg/dL (ref 1.7–2.4)

## 2018-01-14 MED ORDER — ISOSORB DINITRATE-HYDRALAZINE 20-37.5 MG PO TABS
1.0000 | ORAL_TABLET | Freq: Three times a day (TID) | ORAL | Status: DC
  Administered 2018-01-14 – 2018-01-15 (×2): 1 via ORAL
  Filled 2018-01-14 (×2): qty 1

## 2018-01-14 MED ORDER — ALBUMIN HUMAN 25 % IV SOLN
25.0000 g | Freq: Four times a day (QID) | INTRAVENOUS | Status: AC
  Administered 2018-01-14: 25 g via INTRAVENOUS
  Administered 2018-01-14: 12.5 g via INTRAVENOUS
  Administered 2018-01-15 – 2018-01-16 (×5): 25 g via INTRAVENOUS
  Filled 2018-01-14: qty 100
  Filled 2018-01-14 (×8): qty 50

## 2018-01-14 MED ORDER — FUROSEMIDE 10 MG/ML IJ SOLN
40.0000 mg | Freq: Two times a day (BID) | INTRAMUSCULAR | Status: DC
  Administered 2018-01-14 – 2018-01-15 (×2): 40 mg via INTRAVENOUS
  Filled 2018-01-14 (×3): qty 4

## 2018-01-14 NOTE — Progress Notes (Signed)
PROGRESS NOTE    [name]  WCB:[id number] DOB: 28-Oct-1927 DOA: 12/05/2017 PCP: Deland Pretty, MD    Brief Narrative:  Patient is an 84 year old female, with past medical history significant for type 2 diabetes mellitus, hypertension, dyslipidemia coronary disease status post bypass grafting, history of CVA chronic kidney disease stage III, recently diagnosed with achalasia, is being treated with Botox with no significant improvement.  Patient presented with altered mental status.  Patient was noted to have difficulty speaking, finding words, that progressed into total aphasia.  On her initial physical examination her blood pressure was 139/76, respiratory rate16, her lungs were clear to auscultation bilaterally, heart S1-S2 present, rhythmic, abdomen soft nontender, no lower extremity edema, she was noted to have dysarthria, not able to follow commands.   Patient has had prolonged and complicated hospital stay.    On November 10, patient had a J-tube placed, November 20 - rapid response due to volume overload, diagnosed with non-STEMI with systolic heart failure.    On December 2, patient was placed on invasive mechanical ventilation due to respiratory distress, extubated December 6.    01/14/2018: Patient is still not able to provide any significant history.  Patient's level of awareness seems to have improved whilst Lasix was on hold.  However, most recent chest x-ray reveals CHF.  Will resume IV Lasix today, will also transfuse patient with IV albumin.  We will continue to assess volume status closely.  Albumin level is 1.6.  Sodium is 150.  Volume status is difficult to assess accurately, the patient does not look overtly volume overloaded.  We will continue to assess patient on daily basis, and adjust diuretics accordingly.  Prognosis is guarded.  Palliative team has been consulted.  Continue to discuss goal of care.  Assessment & Plan:   Principal Problem:   Dysarthria Active Problems:   Diabetes mellitus ([place])   Hyperlipidemia   Essential hypertension   Coronary atherosclerosis   Stroke (cerebrum) (HCC)   Stage 3 chronic kidney disease ([place])   Acute lower UTI   Adult failure to thrive   UTI (urinary tract infection)   DNR (do not resuscitate) discussion   Palliative care by specialist   CHF (congestive heart failure) ([place])   Aspiration pneumonia due to gastric secretions (HCC)   NSTEMI (non-ST elevated myocardial infarction) ([place])   Altered mental status   Acute on chronic respiratory failure with hypoxia (HCC)  Acute hypoxemic respiratory failure: - Patient has been on treatment for aspiration pneumonia and pulmonary edema.   -Patient is at risk for recurrent aspiration pneumonia.   -Chest x-ray done yesterday continues to reveal pulmonary edema.   -Resume IV diuretics today.   -Continue to monitor closely.   -Guarded/poor prognosis.    Aspiration pneumonia: - As documented above, patient is at risk for recurrent aspiration.  -Monitor closely.  -Aspiration precautions.  -Patient has achalasia.   Acute on chronic combined systolic and diastolic heart failure: Latest echo done revealed EF of 30%, LVH, diffuse hypokinesis as well as an inferior wall. Moderate to severe MR reported. Previous echocardiogram revealed grade 2 diastolic dysfunction. Start IV Lasix 40 mg twice daily (IV Lasix 40 mg once daily was on hold for a couple of days) Monitor respiratory symptoms closely while on IV albumin Start BiDil Monitor blood pressure closely Strict I's and O's Continue metoprolol  Guarded prognosis Monitor volume status closely.  Left temporal and frontal CVA, complicated with metabolic encephalopathy: Patient is not very communicative.   Continue Plavix  Continue statin  Query role of dementing process.  CKD stage 3: Continue to monitor closely Optimize heart failure treatment Cannot rule out component of cardiorenal  syndrome.  Hyponatremia: Continue to assess. Likely multifactorial.  Chronic anemia: Likely multifactorial.  Transfuse packed red blood cells as needed  Chronic dysphagia with moderate protein calorie malnutrition: On nutritional supplements with tube feedings Patient with very poor prognosis.   Diabetes mellitus type 2:  Continue to optimize.     DVT prophylaxis:  Subcutaneous heparin.     Code Status:  full Family Communication:   Body mass index is 21.4 kg/m. Malnutrition Type:  Nutrition Problem: Moderate Malnutrition Etiology: chronic illness(dysphagia 2/2 to achalasia)   Malnutrition Characteristics:  Signs/Symptoms: energy intake < or equal to 75% for > or equal to 1 month, mild fat depletion, moderate fat depletion, mild muscle depletion, moderate muscle depletion, energy intake < or equal to 50% for > or equal to 1 month Percent weight loss: 36 %   Nutrition Interventions:  Interventions: Magic cup, MVI, Refer to RD note for recommendations  RN Pressure Injury Documentation: Pressure Injury 11/04/17 Stage II -  Partial thickness loss of dermis presenting as a shallow open ulcer with a red, pink wound bed without slough. (Active)  11/04/17 1530  Location: Buttocks  Location Orientation: Left  Staging: Stage II -  Partial thickness loss of dermis presenting as a shallow open ulcer with a red, pink wound bed without slough.  Wound Description (Comments):   Present on Admission: Yes     Consultants:     Procedures:     Antimicrobials:       Subjective: No history from patient.   Objective: Vitals:   01/13/18 2224 01/14/18 0729 01/14/18 0827 01/14/18 1143  BP: (!) 111/58 139/73  123/61  Pulse: 81 80  72  Resp: 17 (!) 23  (!) 23  Temp: 98.2 F (36.8 C) 98.2 F (36.8 C)  98.6 F (37 C)  TempSrc: Axillary Axillary  Axillary  SpO2: 100% 99% 100% 100%  Weight:      Height:        Intake/Output Summary (Last 24 hours) at 01/14/2018  1722 Last data filed at 01/13/2018 1900 Gross per 24 hour  Intake 1125 ml  Output -  Net 1125 ml   Filed Weights   01/11/18 0608 01/12/18 0500 01/13/18 0600  Weight: 61 kg 61 kg 54.8 kg    Examination:   General: Chronically ill looking.  Neurology: Awake. Follows command to some extent.  E ENT: mild pallor, no icterus. Cardiovascular: S1 S2.   Pulmonary: decreased air entry globally Gastrointestinal. Abdomen with no organomegaly, non tender, no rebound or guarding   Data Reviewed: I have personally reviewed following labs and imaging studies  CBC: Recent Labs  Lab 01/09/18 0452 01/10/18 0558 01/11/18 0613 01/13/18 0642 01/14/18 1641  WBC 11.0* 10.2 8.7 8.9 9.5  NEUTROABS 9.1* 8.1* 7.3 7.2 7.7  HGB 7.1* 7.1* 7.6* 7.0* 7.4*  HCT 25.0* 24.5* 26.8* 24.5* 25.6*  MCV 99.6 98.0 98.9 101.2* 100.0  PLT 199 182 175 178 128   Basic Metabolic Panel: Recent Labs  Lab 01/08/18 0536 01/09/18 0452 01/10/18 0558 01/11/18 0613 01/13/18 0642 01/14/18 1639 01/14/18 1641  NA 149* 150* 150* 150* 150* 151*  --   K 3.4* 4.0 3.8 3.7 3.9 4.0  --   CL 112* 113* 115* 111 109 110  --   CO2 26 29 26 28  32 30  --   GLUCOSE 149*  177* 103* 187* 217* 246*  --   BUN 53* 61* 64* 63* 75* 84*  --   CREATININE 1.58* 1.68* 1.59* 1.69* 1.74* 1.86*  --   CALCIUM 8.8* 9.0 9.2 9.0 9.1 9.4  --   MG 2.0 2.1 1.9  --   --   --  2.3  PHOS 3.1 3.3 3.6  --  3.4 3.2  --    GFR: Estimated Creatinine Clearance: 17.3 mL/min (A) (by C-G formula based on SCr of 1.86 mg/dL (H)). Liver Function Tests: Recent Labs  Lab 01/13/18 3748 01/14/18 1639  ALBUMIN 1.6* 1.7*   No results for input(s): LIPASE, AMYLASE in the last 168 hours. No results for input(s): AMMONIA in the last 168 hours. Coagulation Profile: No results for input(s): INR, PROTIME in the last 168 hours. Cardiac Enzymes: Recent Labs  Lab 01/09/18 1751 01/10/18 0001 01/10/18 0558 01/10/18 1427  TROPONINI 0.28* 0.27* 0.26* 0.25*   BNP  (last 3 results) No results for input(s): PROBNP in the last 8760 hours. HbA1C: No results for input(s): HGBA1C in the last 72 hours. CBG: Recent Labs  Lab 01/13/18 1955 01/14/18 0007 01/14/18 0331 01/14/18 0725 01/14/18 1140  GLUCAP 155* 157* 231* 230* 246*   Lipid Profile: No results for input(s): CHOL, HDL, LDLCALC, TRIG, CHOLHDL, LDLDIRECT in the last 72 hours. Thyroid Function Tests: No results for input(s): TSH, T4TOTAL, FREET4, T3FREE, THYROIDAB in the last 72 hours. Anemia Panel: No results for input(s): VITAMINB12, FOLATE, FERRITIN, TIBC, IRON, RETICCTPCT in the last 72 hours.    Radiology Studies: I have reviewed all of the imaging during this hospital visit personally     Scheduled Meds: . sodium chloride   Intravenous Once  . atorvastatin  40 mg Per Tube q1800  . chlorhexidine  15 mL Mouth Rinse BID  . cholecalciferol  400 Units Per Tube Daily  . clopidogrel  75 mg Per Tube Daily  . donepezil  5 mg Oral QHS  . ferrous sulfate  300 mg Per Tube Daily  . furosemide  40 mg Intravenous Q12H  . heparin injection (subcutaneous)  5,000 Units Subcutaneous Q8H  . insulin aspart  0-15 Units Subcutaneous Q4H  . insulin detemir  10 Units Subcutaneous Daily  . levalbuterol  0.63 mg Nebulization BID   And  . ipratropium  0.5 mg Nebulization BID  . mouth rinse  15 mL Mouth Rinse q12n4p  . metoCLOPramide  5 mg Oral Q6H  . metoprolol tartrate  50 mg Per Tube BID  . multivitamin  15 mL Per Tube Daily  . nitroGLYCERIN  0.3 mg Transdermal Daily  . pantoprazole sodium  40 mg Per Tube Q1200  . sennosides  5 mL Per Tube QHS   Continuous Infusions: . sodium chloride Stopped (01/08/18 2158)  . albumin human 12.5 g (01/14/18 1701)  . feeding supplement (VITAL AF 1.2 CAL) 45 mL/hr at 01/13/18 1900     LOS: 60 days    Bonnell Public, MD [facility] Pager [phone]

## 2018-01-15 LAB — RENAL FUNCTION PANEL
Albumin: 2.3 g/dL — ABNORMAL LOW (ref 3.5–5.0)
Anion gap: 12 (ref 5–15)
BUN: 88 mg/dL — ABNORMAL HIGH (ref 8–23)
CO2: 30 mmol/L (ref 22–32)
Calcium: 9.6 mg/dL (ref 8.9–10.3)
Chloride: 110 mmol/L (ref 98–111)
Creatinine, Ser: 1.98 mg/dL — ABNORMAL HIGH (ref 0.44–1.00)
GFR calc Af Amer: 26 mL/min — ABNORMAL LOW (ref >60)
GFR calc non Af Amer: 22 mL/min — ABNORMAL LOW (ref >60)
Glucose, Bld: 207 mg/dL — ABNORMAL HIGH (ref 70–99)
Phosphorus: 3 mg/dL (ref 2.5–4.6)
Potassium: 3.8 mmol/L (ref 3.5–5.1)
Sodium: 152 mmol/L — ABNORMAL HIGH (ref 135–145)

## 2018-01-15 LAB — GLUCOSE, CAPILLARY
Glucose-Capillary: 126 mg/dL — ABNORMAL HIGH (ref 70–99)
Glucose-Capillary: 156 mg/dL — ABNORMAL HIGH (ref 70–99)
Glucose-Capillary: 158 mg/dL — ABNORMAL HIGH (ref 70–99)
Glucose-Capillary: 183 mg/dL — ABNORMAL HIGH (ref 70–99)
Glucose-Capillary: 195 mg/dL — ABNORMAL HIGH (ref 70–99)
Glucose-Capillary: 207 mg/dL — ABNORMAL HIGH (ref 70–99)
Glucose-Capillary: 213 mg/dL — ABNORMAL HIGH (ref 70–99)
Glucose-Capillary: 220 mg/dL — ABNORMAL HIGH (ref 70–99)
Glucose-Capillary: 225 mg/dL — ABNORMAL HIGH (ref 70–99)

## 2018-01-15 LAB — CBC WITH DIFFERENTIAL/PLATELET
Abs Immature Granulocytes: 0.03 10*3/uL (ref 0.00–0.07)
Basophils Absolute: 0 10*3/uL (ref 0.0–0.1)
Basophils Relative: 0 %
Eosinophils Absolute: 0.1 10*3/uL (ref 0.0–0.5)
Eosinophils Relative: 1 %
HCT: 24.7 % — ABNORMAL LOW (ref 36.0–46.0)
Hemoglobin: 7.1 g/dL — ABNORMAL LOW (ref 12.0–15.0)
Immature Granulocytes: 0 %
Lymphocytes Relative: 9 %
Lymphs Abs: 0.8 10*3/uL (ref 0.7–4.0)
MCH: 29 pg (ref 26.0–34.0)
MCHC: 28.7 g/dL — ABNORMAL LOW (ref 30.0–36.0)
MCV: 100.8 fL — ABNORMAL HIGH (ref 80.0–100.0)
Monocytes Absolute: 0.6 10*3/uL (ref 0.1–1.0)
Monocytes Relative: 6 %
Neutro Abs: 7.9 10*3/uL — ABNORMAL HIGH (ref 1.7–7.7)
Neutrophils Relative %: 84 %
Platelets: 177 10*3/uL (ref 150–400)
RBC: 2.45 MIL/uL — ABNORMAL LOW (ref 3.87–5.11)
RDW: 17.3 % — ABNORMAL HIGH (ref 11.5–15.5)
WBC: 9.4 10*3/uL (ref 4.0–10.5)
nRBC: 0 % (ref 0.0–0.2)

## 2018-01-15 LAB — MAGNESIUM: Magnesium: 2.3 mg/dL (ref 1.7–2.4)

## 2018-01-15 MED ORDER — INSULIN DETEMIR 100 UNIT/ML ~~LOC~~ SOLN
14.0000 [IU] | Freq: Every day | SUBCUTANEOUS | Status: DC
  Administered 2018-01-16 – 2018-01-18 (×3): 14 [IU] via SUBCUTANEOUS
  Filled 2018-01-15 (×4): qty 0.14

## 2018-01-15 MED ORDER — METOCLOPRAMIDE HCL 5 MG PO TABS
5.0000 mg | ORAL_TABLET | Freq: Four times a day (QID) | ORAL | Status: DC
  Administered 2018-01-15 – 2018-01-17 (×9): 5 mg
  Filled 2018-01-15 (×9): qty 1

## 2018-01-15 MED ORDER — DONEPEZIL HCL 5 MG PO TABS
5.0000 mg | ORAL_TABLET | Freq: Every day | ORAL | Status: DC
  Administered 2018-01-15 – 2018-01-20 (×6): 5 mg
  Filled 2018-01-15 (×6): qty 1

## 2018-01-15 MED ORDER — VITAL AF 1.2 CAL PO LIQD
1000.0000 mL | ORAL | Status: DC
  Filled 2018-01-15: qty 1000

## 2018-01-15 MED ORDER — ISOSORB DINITRATE-HYDRALAZINE 20-37.5 MG PO TABS
1.0000 | ORAL_TABLET | Freq: Three times a day (TID) | ORAL | Status: DC
  Administered 2018-01-15 – 2018-01-19 (×14): 1
  Filled 2018-01-15 (×14): qty 1

## 2018-01-15 MED ORDER — LOPERAMIDE HCL 1 MG/7.5ML PO SUSP
2.0000 mg | ORAL | Status: DC | PRN
  Administered 2018-01-17: 2 mg
  Filled 2018-01-15 (×2): qty 15

## 2018-01-15 MED ORDER — VITAL AF 1.2 CAL PO LIQD
1000.0000 mL | ORAL | Status: DC
  Administered 2018-01-15 – 2018-01-20 (×4): 1000 mL
  Filled 2018-01-15 (×12): qty 1000

## 2018-01-15 NOTE — Progress Notes (Signed)
Patient daughter requested that patient be given tylenol. Patient is morning, yelling out and facial grimacing. Tylenol given as ordered via peg tube.

## 2018-01-15 NOTE — Progress Notes (Signed)
Occupational Therapy Treatment Patient Details Name: [name] MRN: [id number] DOB: 24-Sep-1927 Today's Date: 01/15/2018    History of present illness 84 yo female admitted on 12/05/17 with onset of UTI and AMS was admitted with ongoing weight loss and dehydration with FTT.  S/p G-tube placement on 12/10/17 with ongoing confusion.  Dx with acute respiratory failure with hypoxia due to pulmonary edema, NSTEMI possibly due to volume overload 11/21, acute HF, acute metabolic encepholopathy (per neurology: 3 punctate left temproal and left frontal infarcts, etiology unclear,  but will not explain pt encephalopathy), aspiration PNA 11/23, anemia (acute on chronic), sacral pressure wound.  Respiratory distress on 11/25 with pt combative vs. lethargy.  VDRF 12/2-12/6.  On Bipap on 12/7.  PMHx:  CAD, AKI, angina, cardiomegaly, CABG, CVA with L hemi, CKD 3, transfusion, pleural effusion, DM, achalasia with botox procedure.   OT comments  Session limited due to pt lethargic with eyes closed 75% of time. Patient attempting to wash face and hands when verbally and physically prompted but unable to complete tasks, hand over hand support required. Pt required max - total A for rolling in bed. OT will continue to follow acutely as appropriate  Follow Up Recommendations  SNF    Equipment Recommendations  Other (comment)(TBD at next venue of care)    Recommendations for Other Services      Precautions / Restrictions Precautions Precautions: Fall Precaution Comments: coccygeal wound, Prevalon boots/ Peg 11/10 with abdominal binder, geo mat Restrictions Weight Bearing Restrictions: No       Mobility Bed Mobility Overal bed mobility: Needs Assistance   Rolling: Total assist         General bed mobility comments: max assist of 1-2 to scoot up to [place] transfer comment: unable to move pt due to lethargy    Balance       Sitting balance - Comments:  unable to address       Standing balance comment: unable to address                           ADL either performed or assessed with clinical judgement   ADL       Grooming: Maximal assistance;Wash/dry face;Wash/dry hands;Bed level Grooming Details (indicate cue type and reason): patient attempting to wash face but unable to complete tasks, hand over hand support required                               General ADL Comments: limited participation, eyes closed 75% of time, yelling out     Vision Patient Visual Report: No change from baseline     Perception     Praxis      Cognition Arousal/Alertness: Lethargic Behavior During Therapy: Flat affect Overall Cognitive Status: Impaired/Different from baseline Area of Impairment: Following commands;Safety/judgement;Awareness;Problem solving                     Memory: Decreased recall of precautions;Decreased short-term memory Following Commands: Follows one step commands with increased time Safety/Judgement: Decreased awareness of safety;Decreased awareness of deficits   Problem Solving: Slow processing;Difficulty sequencing;Requires verbal cues General Comments: pt yelling out withn eyes closed, not appropriately respoding to verbal cues/stimulus        Exercises Other Exercises Other Exercises: P/AAROM of B UEs in  multiple planes x 5 reps   Shoulder Instructions       General Comments      Pertinent Vitals/ Pain       Pain Assessment: Faces Faces Pain Scale: Hurts little more Pain Location: General grimacing with movement. Pt did not specify a pain location. Pain Descriptors / Indicators: Grimacing;Moaning Pain Intervention(s): Limited activity within patient's tolerance;Monitored during session;Repositioned  Home Living                                          Prior Functioning/Environment              Frequency           Progress Toward Goals  OT  Goals(current goals can now be found in the care plan section)  Progress towards OT goals: OT to reassess next treatment     Plan Discharge plan remains appropriate    Co-evaluation                 AM-PAC OT "6 Clicks" Daily Activity     Outcome Measure   Help from another person eating meals?: Total Help from another person taking care of personal grooming?: A Lot Help from another person toileting, which includes using toliet, bedpan, or urinal?: Total Help from another person bathing (including washing, rinsing, drying)?: Total Help from another person to put on and taking off regular upper body clothing?: Total Help from another person to put on and taking off regular lower body clothing?: Total 6 Click Score: 7    End of Session    OT Visit Diagnosis: Other abnormalities of gait and mobility (R26.89);Muscle weakness (generalized) (M62.81);Other symptoms and signs involving cognitive function   Activity Tolerance Patient limited by fatigue;Patient limited by lethargy   Patient Left in bed;with call bell/phone within reach;with restraints reapplied;with bed alarm set   Nurse Communication          Time: 6599-3570 OT Time Calculation (min): 12 min  Charges: OT General Charges $OT Visit: 1 Visit OT Treatments $Therapeutic Activity: 8-22 mins     Britt Bottom 01/15/2018, 2:21 PM

## 2018-01-15 NOTE — Progress Notes (Signed)
[place] TEAM 1 - Stepdown/ICU TEAM  [name]  EPP:[id number] DOB: 01-17-1928 DOA: 12/05/2017 PCP: Deland Pretty, MD    Brief Narrative:  84yo F w/ a hx of DM2, HTN, HLD, CAD s/p CABG, CVA, and CKD stage III who was recently diagnosed with achalasia and was being treated with Botox with no significant improvement. She presented with altered mental status and was noted to be having difficulty speaking/finding words, which then progressed to total aphasia.  Patient has had a prolonged and complicated hospital stay. On November 10, patient had a J-tube placed, November 20 - rapid response due to volume overload, diagnosed with non-STEMI with systolic heart failure. On December 2, patient was placed on invasive mechanical ventilation due to respiratory distress,extubated December 6.    Significant Events: 11/5 admit 11/10 G tube placed by IR 11/13 PMT consulted 11/20 neuro consult.  Rapid response for respiratory distress, treated with lasix. 11/21 cardiology consult for NSTEMI. 11/23 rapid response for respiratory distress, treated with lasix and morphine 11/27 ENT consulted for stridor, flexible laryngoscopy negative 12/1 rapid response for respiratory distress, treated with morphine 12/2 PCCM consulted for agonal respirations, transferred to ICU for intubation 12/3 Stable right atelectasis and effusion 12/6 Extubated 12/7 Required BiPAP throughout the day  Subjective: Pt's son has been refusing BIPAP on behalf of pt. the patient is not able to communicate with me but does not appear to be uncomfortable at the time of my exam.  There is no evidence of respiratory distress.  [name] nurse tells me that she is frequently calling out and often times appears to be uncomfortable.  There is no family present in the room at the time of my visit.  Assessment & Plan:  Acute hypoxemic respiratory failure - ESBL Klebsiella aspiration pneumonia and pulmonary edema Requiring only minimal  O2 support at this time -appears to be stabilizing from the standpoint  Aspiration pneumonia Due to achalasia -has completed the course of antibiotic therapy  Encephalopathy due to multiple medical conditions Due to: FTT, Pulmonary edema, CHF with low EF, Anemia, UTI and aspiration pneumonia, NSTEMI with elevated troponin -no appreciable improvement in mental status  Hypernatremia  Hold on further diuresis at this time -gently increase free water per PEG tube  Acute on chronic combined systolic and diastolic heart failure TTE noted EF 30%, LVH, diffuse hypokinesis as well as an inferior wall -no gross volume overload on exam today -continue medical management Filed Weights   01/11/18 0608 01/12/18 0500 01/13/18 0600  Weight: 61 kg 61 kg 54.8 kg    Left temporal and frontal punctate CVA TTE w/o evidence of vegetation/clot - cont statin + plavix per Neuro - carotid doppler nondiagnostic due to lack of cooperation -no further evaluation indicated at this time  CKD stage 3 Creatinine slowly climbing -stop further diuresis and follow trend  Recent Labs  Lab 01/10/18 0558 01/11/18 0613 01/13/18 0642 01/14/18 1639 01/15/18 0534  CREATININE 1.59* 1.69* 1.74* 1.86* 1.98*    Enterococcus UTI Completed tx w/ Zosyn   NSTEMI Treated medically  Chronic anemia Now macrocytic - B12 was greater than 412 days ago -check folate   Chronic dysphagia with moderate protein calorie malnutrition On nutritional supplement with tube feedings  Diabetes mellitus type 2 CBG reasonably controlled  MRSA screen +  DVT prophylaxis: SQ heparin  Code Status: FULL CODE Family Communication: no family present at time of exam  Disposition Plan: Stable for transfer to telemetry bed  Consultants:  PCCM Neurology ENT  Antimicrobials:  Ceftriaxone 11/5 > 11/6 Ampicillin 11/6 > 11/8 Zosyn 11/24 > 11/29  + 12/2 > 12/3 Meropenem 12/4 > 12/10  Objective: Blood pressure 132/71, pulse 86,  temperature 98.5 F (36.9 C), temperature source Axillary, resp. rate (!) 25, height 5\' 3"  (1.6 m), weight 54.8 kg, SpO2 100 %.  Intake/Output Summary (Last 24 hours) at 01/15/2018 1002 Last data filed at 01/15/2018 5852 Gross per 24 hour  Intake 1989.75 ml  Output 1000 ml  Net 989.75 ml   Filed Weights   01/11/18 0608 01/12/18 0500 01/13/18 0600  Weight: 61 kg 61 kg 54.8 kg    Examination: General: No acute respiratory distress evident - pt non-communicative  Lungs: Mild bibasilar crackles with good air movement throughout other fields with no active wheezing Cardiovascular: Regular rate and rhythm without murmur gallop or rub normal S1 and S2 Abdomen: Nontender, nondistended, soft, bowel sounds positive, no rebound, no ascites, no appreciable mass -PEG insertion clean and dry Extremities: No significant cyanosis, clubbing, or edema bilateral lower extremities  CBC: Recent Labs  Lab 01/13/18 0642 01/14/18 1641 01/15/18 0534  WBC 8.9 9.5 9.4  NEUTROABS 7.2 7.7 7.9*  HGB 7.0* 7.4* 7.1*  HCT 24.5* 25.6* 24.7*  MCV 101.2* 100.0 100.8*  PLT 178 200 778   Basic Metabolic Panel: Recent Labs  Lab 01/10/18 0558  01/13/18 0642 01/14/18 1639 01/14/18 1641 01/15/18 0534  NA 150*   < > 150* 151*  --  152*  K 3.8   < > 3.9 4.0  --  3.8  CL 115*   < > 109 110  --  110  CO2 26   < > 32 30  --  30  GLUCOSE 103*   < > 217* 246*  --  207*  BUN 64*   < > 75* 84*  --  88*  CREATININE 1.59*   < > 1.74* 1.86*  --  1.98*  CALCIUM 9.2   < > 9.1 9.4  --  9.6  MG 1.9  --   --   --  2.3 2.3  PHOS 3.6  --  3.4 3.2  --  3.0   < > = values in this interval not displayed.   GFR: Estimated Creatinine Clearance: 16.2 mL/min (A) (by C-G formula based on SCr of 1.98 mg/dL (H)).  Liver Function Tests: Recent Labs  Lab 01/13/18 2423 01/14/18 1639 01/15/18 0534  ALBUMIN 1.6* 1.7* 2.3*    Cardiac Enzymes: Recent Labs  Lab 01/09/18 1751 01/10/18 0001 01/10/18 0558 01/10/18 1427    TROPONINI 0.28* 0.27* 0.26* 0.25*    HbA1C: Hgb A1c MFr Bld  Date/Time Value Ref Range Status  12/24/2017 01:13 PM 5.9 (H) 4.8 - 5.6 % Final    Comment:    (NOTE) Pre diabetes:          5.7%-6.4% Diabetes:              >6.4% Glycemic control for   <7.0% adults with diabetes   11/04/2017 11:10 AM 9.0 (H) 4.8 - 5.6 % Final    Comment:    (NOTE) Pre diabetes:          5.7%-6.4% Diabetes:              >6.4% Glycemic control for   <7.0% adults with diabetes     CBG: Recent Labs  Lab 01/14/18 1705 01/14/18 1949 01/15/18 0018 01/15/18 0424 01/15/18 0833  GLUCAP 220* 183* 158* 195* 213*    Recent Results (from  the past 240 hour(s))  MRSA PCR Screening     Status: Abnormal   Collection Time: 01/06/18  4:31 PM  Result Value Ref Range Status   MRSA by PCR POSITIVE (A) NEGATIVE Final    Comment:        The GeneXpert MRSA Assay (FDA approved for NASAL specimens only), is one component of a comprehensive MRSA colonization surveillance program. It is not intended to diagnose MRSA infection nor to guide or monitor treatment for MRSA infections. CRITICAL RESULT CALLED TO, READ BACK BY AND VERIFIED WITH: RN MEGAN PLUMMER  [phone] FCP Performed at [facility] [address], [place], [place] [address]      Scheduled Meds: . sodium chloride   Intravenous Once  . atorvastatin  40 mg Per Tube q1800  . chlorhexidine  15 mL Mouth Rinse BID  . cholecalciferol  400 Units Per Tube Daily  . clopidogrel  75 mg Per Tube Daily  . donepezil  5 mg Oral QHS  . ferrous sulfate  300 mg Per Tube Daily  . furosemide  40 mg Intravenous Q12H  . heparin injection (subcutaneous)  5,000 Units Subcutaneous Q8H  . insulin aspart  0-15 Units Subcutaneous Q4H  . insulin detemir  10 Units Subcutaneous Daily  . levalbuterol  0.63 mg Nebulization BID   And  . ipratropium  0.5 mg Nebulization BID  . isosorbide-hydrALAZINE  1 tablet Oral TID  . mouth rinse  15 mL Mouth Rinse q12n4p   . metoCLOPramide  5 mg Oral Q6H  . metoprolol tartrate  50 mg Per Tube BID  . multivitamin  15 mL Per Tube Daily  . pantoprazole sodium  40 mg Per Tube Q1200  . sennosides  5 mL Per Tube QHS   Continuous Infusions: . sodium chloride Stopped (01/08/18 2158)  . albumin human 25 g (01/15/18 0430)  . feeding supplement (VITAL AF 1.2 CAL) 1,000 mL (01/14/18 1954)     LOS: 76 days   Cherene Altes, MD [facility] Office  [phone] Pager - Text Page per Amion  If 7PM-7AM, please contact night-coverage per Amion 01/15/2018, 10:02 AM

## 2018-01-15 NOTE — Progress Notes (Deleted)
Nutrition Follow-up  DOCUMENTATION CODES:   Non-severe (moderate) malnutrition in context of chronic illness, Underweight  INTERVENTION:    Increase Vital AF 1.2 formula to goal rate of 55 ml/hr   Provides 1584 kcals, 99 gm protein, and 1070 ml free water daily   NUTRITION DIAGNOSIS:   Moderate Malnutrition related to chronic illness(dysphagia 2/2 to achalasia) as evidenced by energy intake < or equal to 75% for > or equal to 1 month, mild fat depletion, moderate fat depletion, mild muscle depletion, moderate muscle depletion, energy intake < or equal to 50% for > or equal to 1 month, ongoing  GOAL:   Patient will meet greater than or equal to 90% of their needs, currently unmet  MONITOR:   TF tolerance, Labs, Skin, Weight trends, I & O's  ASSESSMENT:   [name] is a 84 y.o. female with medical history significant for DM2, HTN, HLD, CAD s/p CABG, h/o CVA, CKD3, who was recently diagnosed with achalasia after a 50 lb weight loss over the past year. She was treated with botox earlier in October and sent to SNF with the hopes she would be able to swallow her food. She has not done well since that time, has only been able to drink small amounts of milk. Son is a Management consultant in [place] and has been visiting. Yesterday he noticed she was stuttering, having difficulty getting words out, and progressed until she seemed to have total aphasia last night. She does report having dysuria and some lower abd pain. No N/V. Has a good appetite but avoids food because of the obstruction. Megace and remeron have not helped. Process of being worked up for G tube has been initiated and pt reportedly has appt with [facility] tomorrow morning. Son feels like her weight loss and FTT could be reversible if she gets a G tube as she was reportedly very functional a month ago, walking with a walker and performing all of her ADLs.   11/7 - per IR notes, imaging revealed moderate sized hiatal hernia and  colon in upper abdomen, recommending evaluating anatomy with fluoroscopy 11/8 - PICC placed 11/10 - TPN started, IR placed G-tube 11/11 - G-tube cleared for use by RI, TF initiated 11/13 - TPN d/c 11/20 - transitioned to bolus feedings 11/23 - s/p BSE, recommend continued NPO due to AMS, emesis with bolus feedings, rapid response called 11/25 - rapid response, tube feeds held and restarted in the afternoon at 20 ml/hr 11/27 - per MD, tube feeds at 35 ml/hr during the daytime and 30 ml/hr overnight 12/1 - rapid response for increased WOB 12/2 - emesis, TF turned off, intubated 12/6 extubated, BiPAP at night  Pt yelling out with safety mittens on upon RD visit today. Vital AF 1.2 currently infusing at 45 ml/hr via PEG tube. SLP continues to follow for dysphagia treatments.  Palliative Medicine Team note reviewed 12/10. Labs & medications reviewed. Na 152 (H). CBG's [phone].  Diet Order:   Diet Order            Diet NPO time specified  Diet effective now             EDUCATION NEEDS:   Not appropriate for education at this time  Skin:  Skin Assessment: Skin Integrity Issues: Skin Integrity Issues:: Stage II Stage II: left buttocks, sacrum Other: partial thickness wounds to bilateral buttocks (likely from adhesive)  Last BM:  12/13    Intake/Output Summary (Last 24 hours) at 01/15/2018 1550 Last data  filed at 01/15/2018 1319 Gross per 24 hour  Intake 1989.75 ml  Output 1600 ml  Net 389.75 ml   Height:   Ht Readings from Last 1 Encounters:  12/20/17 5\' 3"  (1.6 m)   Weight:   Wt Readings from Last 1 Encounters:  01/13/18 54.8 kg   Ideal Body Weight:  52.3 kg  BMI:  Body mass index is 21.4 kg/m.  Estimated Nutritional Needs:   Kcal:  1450-1650  Protein:  75-90 gm  Fluid:  >/= 1.5 L  Arthur Holms, RD, LDN Pager #: [phone] After-Hours Pager #: [phone]

## 2018-01-15 NOTE — Progress Notes (Signed)
  Speech Language Pathology Treatment: Dysphagia  Patient Details Name: [name] MRN: [id number] DOB: [birth year] Today's Date: 01/15/2018 Time: 2094-7096 SLP Time Calculation (min) (ACUTE ONLY): 17 min  Assessment / Plan / Recommendation Clinical Impression  Pt is alert but confused, calling out and trying to swing a leg OOB repeatedly. She opened her mouth to allow oral care, but would try to bite down on the swab. Pt responded positively when asked if she wanted water, with minimal amounts given via spoon. Max cues were needed for awareness for oral opening, labial closure. Her daughter arrived from [place] during [place] visit and education was provided about current aspiration risk being high due to altered mentation, particularly in light of esophageal hx. She was concerned that pt's calling out was because she was thirsty, so education and training was provided about how to moisten pt's mouth with a swab. Will continue to follow for any changes.   HPI HPI: 84 y.o. female with CAD s/p CABG, recent ischemic stroke, HTN, DM, HLP, recent failure to thrive due to swallowing problems s/p botox injection for achalasia, developed acute pulmonary edema and NSTEMI on 12/20/2017. Bedside swallow on 11/05/17 revealed functional oropharyngeal swallow and suspected esophageal dysphagia. Had esophagram 11/06/17 which showed moderately large hiatal hernia with poor motility and retained barium throughout the esophagus. PEG 12/10/17.  Repeat bedside swallow eval 12/23/17 with recs for NPO secondary to poor MS, no oral anticipation/recognition of POs. Pt with no change in MS that would allow safe oral intake between 11/23 and date of intubation,12/2.  Extubated 12/6. New orders for repeat bedside swallow eval s/p extubation.      SLP Plan  Continue with current plan of care       Recommendations  Diet recommendations: NPO Medication Administration: Via alternative means                Oral Care  Recommendations: Oral care QID Follow up Recommendations: Skilled Nursing facility SLP Visit Diagnosis: Dysphagia, unspecified (R13.10) Plan: Continue with current plan of care       GO                [name] 01/15/2018, 4:11 PM  [name], M.A. [place] Acute Environmental education officer [phone] Office [phone]

## 2018-01-15 NOTE — Progress Notes (Signed)
RN assessed pt at beginning of shift when son present. RN discussed signs/symptoms pt presenting with including yelling out, increased agitation, and grimacing. Son stated he does not want pt to receive fentanyl and/or any atypical antipsychotics before he left for day.   RN reported her findings to [facility] MD when he came to assess pt for the day.  Later, pt started to have increased dyspnea, grabbing for things in the air that are not present, and calling out for her mother. RN shared findings with Thereasa Solo MD. RN to continue to monitor pt at this time.

## 2018-01-15 NOTE — Progress Notes (Signed)
Inpatient Diabetes Program Recommendations  AACE/ADA: New Consensus Statement on Inpatient Glycemic Control (2015)  Target Ranges:  Prepandial:   less than 140 mg/dL      Peak postprandial:   less than 180 mg/dL (1-2 hours)      Critically ill patients:  140 - 180 mg/dL   Results for [name], [name] (MRN [id number]) as of 01/15/2018 09:46  Ref. Range 01/14/2018 00:07 01/14/2018 03:31 01/14/2018 07:25 01/14/2018 11:40 01/14/2018 17:05 01/14/2018 19:49  Glucose-Capillary Latest Ref Range: 70 - 99 mg/dL 157 (H)  3 units NOVOLOG 231 (H)  5 units NOVOLOG 230 (H)  5 units NOVOLOG +  10 units LEVEMIR 246 (H)  5 units NOVOLOG 220 (H)  5 units NOVOLOG 183 (H)  3 units NOVOLOG    Results for [name], [name] (MRN [id number]) as of 01/15/2018 09:46  Ref. Range 01/15/2018 00:18 01/15/2018 04:24 01/15/2018 08:33  Glucose-Capillary Latest Ref Range: 70 - 99 mg/dL 158 (H)  3 units NOVOLOG 195 (H)  3 units NOVOLOG 213 (H)  5 units NOVOLOG +  10 units LEVEMIR     Home DM Meds: Glipizide 10 mg Daily           Current Orders: Levemir 10 units Daily      Novolog Moderate Correction Scale/ SSI (0-15 units) Q4 hours      Having CBGs in the 200s.  Getting Tube feeds Vital AF 45cc/hour.     MD- Please consider adding Novolog Tube Feed Coverage:  Novolog 4 units Q4 hours  HOLD if tube feeds HELD for any reason      --Will follow patient during hospitalization--  Wyn Quaker RN, MSN, CDE Diabetes Coordinator Inpatient Glycemic Control Team Team Pager: [phone] (8a-5p)

## 2018-01-15 NOTE — Progress Notes (Signed)
Nutrition Follow-up  DOCUMENTATION CODES:   Non-severe (moderate) malnutrition in context of chronic illness, Underweight  INTERVENTION:    Increase Vital AF 1.2 formula to goal rate of 55 ml/hr   Provides 1584 kcals, 99 gm protein, and 1070 ml free water daily    Liquid MVI daily via tube  NUTRITION DIAGNOSIS:   Moderate Malnutrition related to chronic illness(dysphagia 2/2 to achalasia) as evidenced by energy intake < or equal to 75% for > or equal to 1 month, mild fat depletion, moderate fat depletion, mild muscle depletion, moderate muscle depletion, energy intake < or equal to 50% for > or equal to 1 month, ongoing  GOAL:   Patient will meet greater than or equal to 90% of their needs, currently unmet  MONITOR:   TF tolerance, Labs, Skin, Weight trends, I & O's  ASSESSMENT:   [name] is a 84 y.o. female with medical history significant for DM2, HTN, HLD, CAD s/p CABG, h/o CVA, CKD3, who was recently diagnosed with achalasia after a 50 lb weight loss over the past year. She was treated with botox earlier in October and sent to SNF with the hopes she would be able to swallow her food. She has not done well since that time, has only been able to drink small amounts of milk. Son is a Management consultant in [place] and has been visiting. Yesterday he noticed she was stuttering, having difficulty getting words out, and progressed until she seemed to have total aphasia last night. She does report having dysuria and some lower abd pain. No N/V. Has a good appetite but avoids food because of the obstruction. Megace and remeron have not helped. Process of being worked up for G tube has been initiated and pt reportedly has appt with [facility] tomorrow morning. Son feels like her weight loss and FTT could be reversible if she gets a G tube as she was reportedly very functional a month ago, walking with a walker and performing all of her ADLs.   11/7 - per IR notes, imaging revealed  moderate sized hiatal hernia and colon in upper abdomen, recommending evaluating anatomy with fluoroscopy 11/8 - PICC placed 11/10 - TPN started, IR placed G-tube 11/11 - G-tube cleared for use by RI, TF initiated 11/13 - TPN d/c 11/20 - transitioned to bolus feedings 11/23 - s/p BSE, recommend continued NPO due to AMS, emesis with bolus feedings, rapid response called 11/25 - rapid response, tube feeds held and restarted in the afternoon at 20 ml/hr 11/27 - per MD, tube feeds at 35 ml/hr during the daytime and 30 ml/hr overnight 12/1 - rapid response for increased WOB 12/2 - emesis, TF turned off, intubated 12/6 extubated, BiPAP at night  Pt yelling out with safety mittens on upon RD visit today. Vital AF 1.2 currently infusing at 45 ml/hr via PEG tube. SLP continues to follow for dysphagia treatments.  Palliative Medicine Team note reviewed 12/10. Labs & medications reviewed. Na 152 (H). CBG's [phone].  Diet Order:   Diet Order            Diet NPO time specified  Diet effective now             EDUCATION NEEDS:   Not appropriate for education at this time  Skin:  Skin Assessment: Skin Integrity Issues: Skin Integrity Issues:: Stage II Stage II: left buttocks, sacrum Other: partial thickness wounds to bilateral buttocks (likely from adhesive)  Last BM:  12/13    Intake/Output Summary (Last  24 hours) at 01/15/2018 1556 Last data filed at 01/15/2018 1319 Gross per 24 hour  Intake 1989.75 ml  Output 1600 ml  Net 389.75 ml   Height:   Ht Readings from Last 1 Encounters:  12/20/17 5\' 3"  (1.6 m)   Weight:   Wt Readings from Last 1 Encounters:  01/13/18 54.8 kg   Ideal Body Weight:  52.3 kg  BMI:  Body mass index is 21.4 kg/m.  Estimated Nutritional Needs:   Kcal:  1450-1650  Protein:  75-90 gm  Fluid:  >/= 1.5 L  Arthur Holms, RD, LDN Pager #: [phone] After-Hours Pager #: [phone]

## 2018-01-16 LAB — VITAMIN B12: Vitamin B-12: 667 pg/mL (ref 180–914)

## 2018-01-16 LAB — COMPREHENSIVE METABOLIC PANEL
ALT: 108 U/L — ABNORMAL HIGH (ref 0–44)
ANION GAP: 11 (ref 5–15)
AST: 93 U/L — ABNORMAL HIGH (ref 15–41)
Albumin: 3 g/dL — ABNORMAL LOW (ref 3.5–5.0)
Alkaline Phosphatase: 105 U/L (ref 38–126)
BUN: 96 mg/dL — ABNORMAL HIGH (ref 8–23)
CHLORIDE: 111 mmol/L (ref 98–111)
CO2: 31 mmol/L (ref 22–32)
Calcium: 9.8 mg/dL (ref 8.9–10.3)
Creatinine, Ser: 2.2 mg/dL — ABNORMAL HIGH (ref 0.44–1.00)
GFR calc Af Amer: 22 mL/min — ABNORMAL LOW (ref >60)
GFR calc non Af Amer: 19 mL/min — ABNORMAL LOW (ref >60)
Glucose, Bld: 220 mg/dL — ABNORMAL HIGH (ref 70–99)
Potassium: 3.6 mmol/L (ref 3.5–5.1)
Sodium: 153 mmol/L — ABNORMAL HIGH (ref 135–145)
Total Bilirubin: 0.9 mg/dL (ref 0.3–1.2)
Total Protein: 6.6 g/dL (ref 6.5–8.1)

## 2018-01-16 LAB — GLUCOSE, CAPILLARY
Glucose-Capillary: 160 mg/dL — ABNORMAL HIGH (ref 70–99)
Glucose-Capillary: 198 mg/dL — ABNORMAL HIGH (ref 70–99)
Glucose-Capillary: 152 mg/dL — ABNORMAL HIGH (ref 70–99)
Glucose-Capillary: 121 mg/dL — ABNORMAL HIGH (ref 70–99)
Glucose-Capillary: 116 mg/dL — ABNORMAL HIGH (ref 70–99)

## 2018-01-16 LAB — RETICULOCYTES
Immature Retic Fract: 15.6 % (ref 2.3–15.9)
RBC.: 2.2 MIL/uL — ABNORMAL LOW (ref 3.87–5.11)
Retic Count, Absolute: 41.4 10*3/uL (ref 19.0–186.0)
Retic Ct Pct: 1.9 % (ref 0.4–3.1)

## 2018-01-16 LAB — CBC
HEMATOCRIT: 22.3 % — AB (ref 36.0–46.0)
Hemoglobin: 6.5 g/dL — CL (ref 12.0–15.0)
MCH: 29.5 pg (ref 26.0–34.0)
MCHC: 29.1 g/dL — ABNORMAL LOW (ref 30.0–36.0)
MCV: 101.4 fL — ABNORMAL HIGH (ref 80.0–100.0)
Platelets: 170 10*3/uL (ref 150–400)
RBC: 2.2 MIL/uL — AB (ref 3.87–5.11)
RDW: 17.6 % — ABNORMAL HIGH (ref 11.5–15.5)
WBC: 8.4 10*3/uL (ref 4.0–10.5)
nRBC: 0.2 % (ref 0.0–0.2)

## 2018-01-16 LAB — FOLATE: Folate: 17.2 ng/mL (ref >5.9)

## 2018-01-16 LAB — IRON AND TIBC
Iron: 29 ug/dL (ref 28–170)
Saturation Ratios: 29 % (ref 10.4–31.8)
TIBC: 99 ug/dL — ABNORMAL LOW (ref 250–450)
UIBC: 70 ug/dL

## 2018-01-16 LAB — FERRITIN: Ferritin: 1311 ng/mL — ABNORMAL HIGH (ref 11–307)

## 2018-01-16 LAB — PREPARE RBC (CROSSMATCH)

## 2018-01-16 MED ORDER — ACETAMINOPHEN-CODEINE #3 300-30 MG PO TABS
1.0000 | ORAL_TABLET | ORAL | Status: DC | PRN
  Administered 2018-01-16: 2 via ORAL
  Filled 2018-01-16 (×2): qty 2

## 2018-01-16 MED ORDER — MORPHINE SULFATE (PF) 2 MG/ML IV SOLN
1.0000 mg | INTRAVENOUS | Status: DC | PRN
  Administered 2018-01-16: 2 mg via INTRAVENOUS
  Filled 2018-01-16: qty 1

## 2018-01-16 MED ORDER — MORPHINE SULFATE (PF) 2 MG/ML IV SOLN
1.0000 mg | Freq: Once | INTRAVENOUS | Status: AC
  Administered 2018-01-16: 1 mg via INTRAVENOUS
  Filled 2018-01-16: qty 1

## 2018-01-16 MED ORDER — PHENOL 1.4 % MT LIQD: 1.0000 | OROMUCOSAL | Status: DC | PRN

## 2018-01-16 MED ORDER — FUROSEMIDE 10 MG/ML IJ SOLN
20.0000 mg | Freq: Once | INTRAMUSCULAR | Status: AC
  Administered 2018-01-16: 20 mg via INTRAVENOUS
  Filled 2018-01-16: qty 2

## 2018-01-16 MED ORDER — BENZOCAINE 20 % MT AERO
INHALATION_SPRAY | Freq: Four times a day (QID) | OROMUCOSAL | Status: DC | PRN
  Administered 2018-01-16: 15:00:00 via OROMUCOSAL
  Filled 2018-01-16 (×2): qty 57

## 2018-01-16 MED ORDER — FREE WATER
200.0000 mL | Freq: Three times a day (TID) | Status: DC
  Administered 2018-01-16 (×2): 200 mL
  Administered 2018-01-16 – 2018-01-17 (×2): 30 mL

## 2018-01-16 MED ORDER — HYDROCODONE-ACETAMINOPHEN 5-325 MG PO TABS
1.0000 | ORAL_TABLET | ORAL | Status: DC | PRN
  Administered 2018-01-16: 2 via ORAL
  Administered 2018-01-17 – 2018-01-18 (×2): 1 via ORAL
  Filled 2018-01-16: qty 1
  Filled 2018-01-16: qty 2
  Filled 2018-01-16: qty 1

## 2018-01-16 MED ORDER — QUETIAPINE FUMARATE 25 MG PO TABS
25.0000 mg | ORAL_TABLET | Freq: Every day | ORAL | Status: DC
  Administered 2018-01-16 (×2): 25 mg
  Filled 2018-01-16 (×2): qty 1

## 2018-01-16 MED ORDER — SODIUM CHLORIDE 0.9% IV SOLUTION
Freq: Once | INTRAVENOUS | Status: AC
  Administered 2018-01-16: 10:00:00 via INTRAVENOUS

## 2018-01-16 MED ORDER — BENZOCAINE-MENTHOL 20-0.5 % EX AERO: 1.0000 "application " | INHALATION_SPRAY | Freq: Four times a day (QID) | CUTANEOUS | Status: DC | PRN

## 2018-01-16 NOTE — Progress Notes (Addendum)
PROGRESS NOTE    [name]  QIH:[id number] DOB: 09-01-1927 DOA: 12/05/2017 PCP: Deland Pretty, MD   Brief Narrative: 84yo F w/ a hx of DM2, HTN, HLD, CAD s/p CABG, CVA, and CKD stage III who was recentlydiagnosed with achalasia and was being treated with Botox with no significant improvement. She presented with altered mental status and was noted to be having difficulty speaking/finding words, which then progressed to total aphasia. Patient has had a prolongedand complicated hospital stay. On November 10, patienthad a J-tube placed, November 20 -rapid response due to volume overload, diagnosed with non-STEMI with systolic heart failure. On December 2, patientwasplaced on invasive mechanical ventilation due to respiratory distress,extubated December 6. Patient remains bedbound, lethargic only arousable to voice and touch.  Palliative care was involved but family wants to continue aggressive treatment and denying comfort care measures so they are following only peripherally.  Events: 11/5 admit 11/10 G tube placed by IR 11/13 PMT consulted 11/20 neuro consult. Rapid response for respiratory distress, treated with lasix. 11/21 cardiology consult for NSTEMI. 11/23 rapid response for respiratory distress, treated with lasix and morphine 11/27 ENT consulted for stridor, flexible laryngoscopy negative 12/1 rapid response for respiratory distress, treated with morphine 12/2 PCCM consulted for agonal respirations, transferred to ICU for intubation 12/3 Stable right atelectasis and effusion 12/6 Extubated    Assessment & Plan:   Principal Problem:   Dysarthria Active Problems:   Diabetes mellitus ([place])   Hyperlipidemia   Essential hypertension   Coronary atherosclerosis   Stroke (cerebrum) (HCC)   Stage 3 chronic kidney disease ([place])   Acute lower UTI   Adult failure to thrive   UTI (urinary tract infection)   DNR (do not resuscitate) discussion   Palliative care by  specialist   CHF (congestive heart failure) ([place])   Aspiration pneumonia due to gastric secretions ([place])   NSTEMI (non-ST elevated myocardial infarction) ([place])   Altered mental status   Acute on chronic respiratory failure with hypoxia (HCC)  Acute hypoxemic respiratory failure - ESBL Klebsiella aspiration pneumonia and pulmonary edema Requiring only minimal O2 support at this time -appears to be stabilizing from the standpoint  Aspiration pneumonia Due to achalasia -has completed the course of antibiotic therapy  Encephalopathy due to multiple medical conditions Due to: FTT, Pulmonary edema, CHF with low EF, Anemia, UTI and aspiration pneumonia, NSTEMI with elevated troponin -no appreciable improvement in mental status  Hypernatremia  Continue free water per PEG tube   Acute on chroniccombined systolic anddiastolic heart failure:TTE noted EF 30%, LV, diffuse hypokinesis as well as an inferior wall -no gross volume overload on exam today -continue medical management  Left temporal and frontal punctate CVA TTE w/o evidence of vegetation/clot - cont statin + plavix per Neuro - carotid doppler nondiagnostic due to lack of cooperation -no further evaluation indicated at this time  CKD stage 3 Creatinine slowly climbing -stop further diuresis and follow trend  Enterococcus UTI Completed tx w/ Zosyn   NSTEMI Treated medically  Acute on Chronic anemia Hb 6.5 today.  Discussed with son.  This anemia is associated with her chronic medical illness.  We will transfuse her with 1 unit of PRBC.  We will give her Lasix of 20 mg IV once after transfusion If her hemoglobin continues to drop, will hold  antiplatelets and anticoagulants  Chronic dysphagia with moderate protein calorie malnutrition On nutritional supplement with tube feedings.She is NPO  Diabetes mellitus type 2 CBG reasonably controlled  Upper palate lesion: Noted dark  spot on her upper palate along with  small vesicles.  Will treat with local spray for pain management .  MRSA screen +    Nutrition Problem: Moderate Malnutrition Etiology: chronic illness(dysphagia 2/2 to achalasia)      DVT prophylaxis: [place] heparin Code Status: Full Family Communication: Son  Disposition Plan: Undetermined.  The goal of family is to take her home but she is not stable for that. Very prolonged hospitalization.   Consultants: PCCM, palliative care  Procedures: As above  Antimicrobials: Completed the course  Subjective: Patient seen and examined the bedside this morning.  Remains significantly lethargic.  Arousable only to voice and stimuli.  Bedbound.  Not in significant respiratory distress.Extensive discussion with son done.  Objective: Vitals:   01/16/18 0500 01/16/18 0628 01/16/18 0754 01/16/18 0809  BP:  131/61  (!) 143/71  Pulse:    90  Resp:    20  Temp:  97.8 F (36.6 C)  97.8 F (36.6 C)  TempSrc:  Axillary    SpO2:  100% 100% 100%  Weight: 53.7 kg     Height:        Intake/Output Summary (Last 24 hours) at 01/16/2018 5916 Last data filed at 01/15/2018 1803 Gross per 24 hour  Intake 570.24 ml  Output 600 ml  Net -29.76 ml   Filed Weights   01/12/18 0500 01/13/18 0600 01/16/18 0500  Weight: 61 kg 54.8 kg 53.7 kg    Examination:  General exam: Chronically ill looking, thin built  HEENT:PERRL,Oral mucosa dry, Ear/Nose normal on gross exam,Noted dark spot on her upper palate along with small vesicles Respiratory system: Bilateral decreased air entry Cardiovascular system: S1 & S2 heard, RRR. No JVD, murmurs, rubs, gallops or clicks. No pedal edema. Gastrointestinal system: Abdomen is nondistended, soft and nontender. No organomegaly or masses felt. Normal bowel sounds heard.J tube Central nervous system:Not Alert and oriented.  Extremities: No edema, no clubbing ,no cyanosis, distal peripheral pulses palpable. Skin: No rashes, lesions or ulcers,no icterus ,no  pallor    Data Reviewed: I have personally reviewed following labs and imaging studies  CBC: Recent Labs  Lab 01/10/18 0558 01/11/18 0613 01/13/18 0642 01/14/18 1641 01/15/18 0534 01/16/18 0503  WBC 10.2 8.7 8.9 9.5 9.4 8.4  NEUTROABS 8.1* 7.3 7.2 7.7 7.9*  --   HGB 7.1* 7.6* 7.0* 7.4* 7.1* 6.5*  HCT 24.5* 26.8* 24.5* 25.6* 24.7* 22.3*  MCV 98.0 98.9 101.2* 100.0 100.8* 101.4*  PLT 182 175 178 200 177 384   Basic Metabolic Panel: Recent Labs  Lab 01/10/18 0558 01/11/18 0613 01/13/18 0642 01/14/18 1639 01/14/18 1641 01/15/18 0534  NA 150* 150* 150* 151*  --  152*  K 3.8 3.7 3.9 4.0  --  3.8  CL 115* 111 109 110  --  110  CO2 26 28 32 30  --  30  GLUCOSE 103* 187* 217* 246*  --  207*  BUN 64* 63* 75* 84*  --  88*  CREATININE 1.59* 1.69* 1.74* 1.86*  --  1.98*  CALCIUM 9.2 9.0 9.1 9.4  --  9.6  MG 1.9  --   --   --  2.3 2.3  PHOS 3.6  --  3.4 3.2  --  3.0   GFR: Estimated Creatinine Clearance: 16.2 mL/min (A) (by C-G formula based on SCr of 1.98 mg/dL (H)). Liver Function Tests: Recent Labs  Lab 01/13/18 6659 01/14/18 1639 01/15/18 0534  ALBUMIN 1.6* 1.7* 2.3*   No results for input(s): LIPASE, AMYLASE  in the last 168 hours. No results for input(s): AMMONIA in the last 168 hours. Coagulation Profile: No results for input(s): INR, PROTIME in the last 168 hours. Cardiac Enzymes: Recent Labs  Lab 01/09/18 1751 01/10/18 0001 01/10/18 0558 01/10/18 1427  TROPONINI 0.28* 0.27* 0.26* 0.25*   BNP (last 3 results) No results for input(s): PROBNP in the last 8760 hours. HbA1C: No results for input(s): HGBA1C in the last 72 hours. CBG: Recent Labs  Lab 01/15/18 1653 01/15/18 1956 01/15/18 2313 01/16/18 0343 01/16/18 0810  GLUCAP 225* 156* 126* 160* 198*   Lipid Profile: No results for input(s): CHOL, HDL, LDLCALC, TRIG, CHOLHDL, LDLDIRECT in the last 72 hours. Thyroid Function Tests: No results for input(s): TSH, T4TOTAL, FREET4, T3FREE, THYROIDAB  in the last 72 hours. Anemia Panel: Recent Labs    01/16/18 0503  RETICCTPCT 1.9   Sepsis Labs: No results for input(s): PROCALCITON, LATICACIDVEN in the last 168 hours.  Recent Results (from the past 240 hour(s))  MRSA PCR Screening     Status: Abnormal   Collection Time: 01/06/18  4:31 PM  Result Value Ref Range Status   MRSA by PCR POSITIVE (A) NEGATIVE Final    Comment:        The GeneXpert MRSA Assay (FDA approved for NASAL specimens only), is one component of a comprehensive MRSA colonization surveillance program. It is not intended to diagnose MRSA infection nor to guide or monitor treatment for MRSA infections. CRITICAL RESULT CALLED TO, READ BACK BY AND VERIFIED WITH: RN MEGAN PLUMMER  [phone] FCP Performed at [facility] [address], [place], [place] [address]          Radiology Studies: No results found.      Scheduled Meds: . atorvastatin  40 mg Per Tube q1800  . chlorhexidine  15 mL Mouth Rinse BID  . cholecalciferol  400 Units Per Tube Daily  . clopidogrel  75 mg Per Tube Daily  . donepezil  5 mg Per Tube QHS  . ferrous sulfate  300 mg Per Tube Daily  . heparin injection (subcutaneous)  5,000 Units Subcutaneous Q8H  . insulin aspart  0-15 Units Subcutaneous Q4H  . insulin detemir  14 Units Subcutaneous Daily  . levalbuterol  0.63 mg Nebulization BID   And  . ipratropium  0.5 mg Nebulization BID  . isosorbide-hydrALAZINE  1 tablet Per Tube TID  . mouth rinse  15 mL Mouth Rinse q12n4p  . metoCLOPramide  5 mg Per Tube Q6H  . metoprolol tartrate  50 mg Per Tube BID  . multivitamin  15 mL Per Tube Daily  . pantoprazole sodium  40 mg Per Tube Q1200  . sennosides  5 mL Per Tube QHS   Continuous Infusions: . sodium chloride Stopped (01/08/18 2158)  . albumin human 25 g (01/16/18 0453)  . feeding supplement (VITAL AF 1.2 CAL) 1,000 mL (01/15/18 2118)     LOS: 38 days    Time spent: 35 mins.More than 50% of that time was spent  in counseling and/or coordination of care.      Shelly Coss, MD [facility] Pager [phone]  If 7PM-7AM, please contact night-coverage [email or web address] Password [facility] 01/16/2018, 9:24 AM

## 2018-01-16 NOTE — Progress Notes (Signed)
Patient's daughter requested that nurse paged the doctor to get an order for Morphine IV be give to patient. Patient mourning, yelling and restless. Daughter requested the lowest dose. Provider on call paged  Per patient daughters request.

## 2018-01-16 NOTE — Progress Notes (Signed)
CRITICAL VALUE ALERT  Critical Value:  hgb = 6.5  Date & Time Notied:  01/16/18 9:28  Provider Notified: Tawanna Solo MD   Orders Received/Actions taken: Paged MD - pending response

## 2018-01-16 NOTE — Progress Notes (Signed)
This nurse flushed the picc Line with normal saline and the patients daughter was concerned that she may be getting too much fluid. This nurse explained to daughter that per protocol and order that picc line has to be flushed while administering medications and afterwards. Daughter questioning and insinuating things about staff from previous shift. Patient is lethargic, responds to stimuli or voice. Facial grimacing, yelling out and mourned all night. Family does not want any pain medication to be given without authorization.

## 2018-01-16 NOTE — Progress Notes (Signed)
PT Cancellation Note  Patient Details Name: [name] MRN: [id number] DOB: November 23, 1927   Cancelled Treatment:    Reason Eval/Treat Not Completed: Medical issues which prohibited therapy.  Pt is unfocused and thrashing around, unable to be seen for PT.  Nursing is watching her closely, and will reassess her tomorrow for whether she is appropriate to remain on active PT list.     Ramond Dial 01/16/2018, 3:34 PM   Mee Hives, PT MS Acute Rehab Dept. Number: [place] and [place]

## 2018-01-16 NOTE — Progress Notes (Addendum)
Patients daughter and son called this Probation officer upset that patient is still getting 200 cc of free water every 8 hrs after discussion with prior provider and day nurse to decrease it. The son concerned that the patient is being overloaded with fluid. Nurse explained to them that the orders still say 200 but I gave 30 cc per their request.This nurse told family that the on call provider will be notified and message given to hold off on the 200 cc of free water via peg tube. Charge nurse and [facility] made aware.

## 2018-01-17 LAB — CBC WITH DIFFERENTIAL/PLATELET
Abs Immature Granulocytes: 0.02 10*3/uL (ref 0.00–0.07)
Basophils Absolute: 0 10*3/uL (ref 0.0–0.1)
Basophils Relative: 0 %
Eosinophils Absolute: 0.1 10*3/uL (ref 0.0–0.5)
Eosinophils Relative: 2 %
HCT: 26.2 % — ABNORMAL LOW (ref 36.0–46.0)
Hemoglobin: 7.7 g/dL — ABNORMAL LOW (ref 12.0–15.0)
Immature Granulocytes: 0 %
Lymphocytes Relative: 11 %
Lymphs Abs: 0.8 10*3/uL (ref 0.7–4.0)
MCH: 28.7 pg (ref 26.0–34.0)
MCHC: 29.4 g/dL — ABNORMAL LOW (ref 30.0–36.0)
MCV: 97.8 fL (ref 80.0–100.0)
Monocytes Absolute: 0.5 10*3/uL (ref 0.1–1.0)
Monocytes Relative: 7 %
Neutro Abs: 5.7 10*3/uL (ref 1.7–7.7)
Neutrophils Relative %: 80 %
PLATELETS: 163 10*3/uL (ref 150–400)
RBC: 2.68 MIL/uL — ABNORMAL LOW (ref 3.87–5.11)
RDW: 19.9 % — ABNORMAL HIGH (ref 11.5–15.5)
WBC: 7.2 10*3/uL (ref 4.0–10.5)
nRBC: 0 % (ref 0.0–0.2)

## 2018-01-17 LAB — TYPE AND SCREEN
ABO/RH(D): O POS
ANTIBODY SCREEN: NEGATIVE
Unit division: 0

## 2018-01-17 LAB — GLUCOSE, CAPILLARY
Glucose-Capillary: 115 mg/dL — ABNORMAL HIGH (ref 70–99)
Glucose-Capillary: 138 mg/dL — ABNORMAL HIGH (ref 70–99)
Glucose-Capillary: 144 mg/dL — ABNORMAL HIGH (ref 70–99)
Glucose-Capillary: 151 mg/dL — ABNORMAL HIGH (ref 70–99)
Glucose-Capillary: 97 mg/dL (ref 70–99)
Glucose-Capillary: 99 mg/dL (ref 70–99)

## 2018-01-17 LAB — BPAM RBC
Blood Product Expiration Date: 202001112359
ISSUE DATE / TIME: 201912171231
Unit Type and Rh: 5100

## 2018-01-17 LAB — BASIC METABOLIC PANEL
Anion gap: 11 (ref 5–15)
BUN: 102 mg/dL — ABNORMAL HIGH (ref 8–23)
CO2: 30 mmol/L (ref 22–32)
Calcium: 9 mg/dL (ref 8.9–10.3)
Chloride: 110 mmol/L (ref 98–111)
Creatinine, Ser: 2.23 mg/dL — ABNORMAL HIGH (ref 0.44–1.00)
GFR calc Af Amer: 22 mL/min — ABNORMAL LOW (ref >60)
GFR calc non Af Amer: 19 mL/min — ABNORMAL LOW (ref >60)
Glucose, Bld: 158 mg/dL — ABNORMAL HIGH (ref 70–99)
Potassium: 4.1 mmol/L (ref 3.5–5.1)
Sodium: 151 mmol/L — ABNORMAL HIGH (ref 135–145)

## 2018-01-17 MED ORDER — QUETIAPINE FUMARATE 25 MG PO TABS
12.5000 mg | ORAL_TABLET | Freq: Every day | ORAL | Status: DC
  Administered 2018-01-17: 12.5 mg
  Filled 2018-01-17: qty 1

## 2018-01-17 MED ORDER — METOPROLOL TARTRATE 25 MG PO TABS
25.0000 mg | ORAL_TABLET | Freq: Two times a day (BID) | ORAL | Status: DC
  Administered 2018-01-17 – 2018-02-26 (×80): 25 mg
  Filled 2018-01-17 (×81): qty 1

## 2018-01-17 MED ORDER — FREE WATER
30.0000 mL | Freq: Three times a day (TID) | Status: DC
  Administered 2018-01-17 – 2018-01-18 (×3): 30 mL

## 2018-01-17 NOTE — Progress Notes (Signed)
OT Cancellation Note  Patient Details Name: [name] MRN: [id number] DOB: July 23, 1927   Cancelled Treatment:    Reason Eval/Treat Not Completed: Medical issues which prohibited therapy;Other (comment)(Daughter request OT/PT return later today when pt is possibly more awake and alert. Will return as schedule allows. Thank you.)  [place], OTR/L Acute Rehab Pager: [phone] Office: [phone] 01/17/2018, 10:04 AM

## 2018-01-17 NOTE — Progress Notes (Signed)
Pt is lethargic and resistant to being on side of bed, so PT and OT saw together and worked on control of her balance.  Pt had UE support of pillows so was able to control sitting by avoiding AP shifts in trunk with pt being somewhat resistant to sitting.  Follow acutely for progression to controlled sitting and then standing if able in the future.  01/17/18 1300  PT Visit Information  Last PT Received On 01/17/18  Assistance Needed +2  PT/OT/SLP Co-Evaluation/Treatment Yes  Reason for Co-Treatment Complexity of the patient's impairments (multi-system involvement);For patient/therapist safety  PT goals addressed during session Mobility/safety with mobility;Balance  History of Present Illness 84 yo female admitted on 12/05/17 with onset of UTI and AMS was admitted with ongoing weight loss and dehydration with FTT.  S/p G-tube placement on 12/10/17 with ongoing confusion.  Dx with acute respiratory failure with hypoxia due to pulmonary edema, NSTEMI possibly due to volume overload 11/21, acute HF, acute metabolic encepholopathy (per neurology: 3 punctate left temproal and left frontal infarcts, etiology unclear,  but will not explain pt encephalopathy), aspiration PNA 11/23, anemia (acute on chronic), sacral pressure wound.  Respiratory distress on 11/25 with pt combative vs. lethargy.  VDRF 12/2-12/6.  On Bipap on 12/7.  PMHx:  CAD, AKI, angina, cardiomegaly, CABG, CVA with L hemi, CKD 3, transfusion, pleural effusion, DM, achalasia with botox procedure.  Subjective Data  Subjective daughter was in to observe her function  Patient Stated Goal unable to state  Precautions  Precautions Fall  Precaution Comments coccygeal wound, Prevalon boots/ Peg 11/10 with abdominal binder, geo mat  Restrictions  Weight Bearing Restrictions No  Pain Assessment  Pain Assessment Faces  Faces Pain Scale 2  Pain Location mild restlessness and no clear sign of any activity being a good mobility  Pain Descriptors /  Indicators Sore  Pain Intervention(s) Monitored during session;Premedicated before session;Repositioned  Cognition  Arousal/Alertness Lethargic  Behavior During Therapy Flat affect  Overall Cognitive Status Impaired/Different from baseline  Area of Impairment Following commands;Safety/judgement;Awareness;Problem solving  Orientation Level Situation  Current Attention Level Selective  Memory Decreased recall of precautions;Decreased short-term memory  Following Commands Follows one step commands with increased time  Safety/Judgement Decreased awareness of safety;Decreased awareness of deficits  Awareness Intellectual  Problem Solving Slow processing;Difficulty sequencing;Requires verbal cues  General Comments pt yelling out withn eyes closed, not appropriately respoding to verbal cues/stimulus  Difficult to assess due to Impaired communication;Level of arousal  Bed Mobility  Overal bed mobility Needs Assistance  Bed Mobility Supine to Sit;Sit to Supine  Rolling Total assist  Sidelying to sit Total assist;+2 for safety/equipment;+2 for physical assistance  Supine to sit Total assist;+2 for physical assistance;+2 for safety/equipment  Sit to supine Total assist;+2 for physical assistance;+2 for safety/equipment  General bed mobility comments total assist including pt resisting sitting on side of bed  Transfers  Overall transfer level Needs assistance  General transfer comment pt is too lethargic to attempt  Balance  Sitting balance-Leahy Scale Zero  Sitting balance - Comments pt is too resistant to get a clear effort to control sitting  Postural control Posterior lean;Right lateral lean;Left lateral lean;Other (comment) (forward lean at times)  Exercises  Exercises General Upper Extremity;General Lower Extremity  General Exercises - Upper Extremity  Shoulder Flexion PROM;Both;10 reps  Shoulder Extension PROM;Both;10 reps  Shoulder ABduction PROM;Both;10 reps  Shoulder ADduction  PROM;Both;10 reps  Elbow Flexion PROM;Both;10 reps  Elbow Extension PROM;Both;10 reps  Wrist Flexion PROM;Both;10 reps  Wrist Extension PROM;Both;10 reps  General Exercises - Lower Extremity  Ankle Circles/Pumps PROM;Both;5 reps  Heel Slides PROM;Both;10 reps  Hip ABduction/ADduction PROM;Both;10 reps  Hip Flexion/Marching PROM;Both;10 reps  PT - End of Session  Equipment Utilized During Treatment Oxygen  Activity Tolerance Patient tolerated treatment well  Patient left with call bell/phone within reach;with chair alarm set;in bed  Nurse Communication Mobility status   PT - Assessment/Plan  PT Plan Current plan remains appropriate  PT Visit Diagnosis Muscle weakness (generalized) (M62.81);Other abnormalities of gait and mobility (R26.89);Adult, failure to thrive (R62.7);Hemiplegia and hemiparesis  Hemiplegia - Right/Left Left  Hemiplegia - dominant/non-dominant Non-dominant  Hemiplegia - caused by Unspecified  PT Frequency (ACUTE ONLY) Min 2X/week  AM-PAC PT "6 Clicks" Mobility Outcome Measure (Version 2)  Help needed turning from your back to your side while in a flat bed without using bedrails? 2  Help needed moving from lying on your back to sitting on the side of a flat bed without using bedrails? 1  Help needed moving to and from a bed to a chair (including a wheelchair)? 1  Help needed standing up from a chair using your arms (e.g., wheelchair or bedside chair)? 1  Help needed to walk in hospital room? 1  Help needed climbing 3-5 steps with a railing?  1  6 Click Score 7  Consider Recommendation of Discharge To: CIR/SNF/LTACH  PT Goal Progression  Progress towards PT goals Not progressing toward goals - comment  Acute Rehab PT Goals  PT Goal Formulation Patient unable to participate in goal setting  PT Time Calculation  PT Start Time (ACUTE ONLY) 1200  PT Stop Time (ACUTE ONLY) 1225  PT Time Calculation (min) (ACUTE ONLY) 25 min  PT General Charges  $$ ACUTE PT VISIT 1  Visit  PT Treatments  $Therapeutic Exercise 8-22 mins    Mee Hives, PT MS Acute Rehab Dept. Number: [place] and [place]

## 2018-01-17 NOTE — Progress Notes (Signed)
PROGRESS NOTE    [name]  LNL:[id number] DOB: 02-13-1927 DOA: 12/05/2017 PCP: Deland Pretty, MD   Brief Narrative: .  Family history of diabetes, hypertension, hyperlipidemia, CAD status post CABG, history of CVA, CKD stage III who was recently diagnosed with achalasia and was being treated with Botox with no significant improvement.  She was admitted with altered mental status and was found to have difficulty speaking and finding words which then progressed to total aphasia patient has been hospitalized she had prolonged hospital course, major events listed as below with Events: 11/5 admit 11/10 G tube placed by IR 11/13 PMT consulted 11/20 neuro consult. Rapid response for respiratory distress, treated with lasix. 11/21 cardiology consult for NSTEMI. 11/23 rapid response for respiratory distress, treated with lasix and morphine 11/27 ENT consulted for stridor, flexible laryngoscopy negative 12/1 rapid response for respiratory distress, treated with morphine 12/2 PCCM consulted for agonal respirations, transferred to ICU for intubation 12/3 Stable right atelectasis and effusion 12/6 Extubated " Currently patient is remains bedbound, lethargic, intermittently agitated.  Palliative care had seen the patient, but family wanted to pursue aggressive measures, and they are following peripherally. Patient was transfused 1 unit of PRBC for chronic anemia 12/17.  As per family request patient was placed on Seroquel to help with agitation 12/17. patient was transferred back to progressive unit 12/18.  Assessment & Plan:   Principal Problem:   Dysarthria Active Problems:   Diabetes mellitus ([place])   Hyperlipidemia   Essential hypertension   Coronary atherosclerosis   Stroke (cerebrum) (HCC)   Stage 3 chronic kidney disease ([place])   Acute lower UTI   Adult failure to thrive   UTI (urinary tract infection)   DNR (do not resuscitate) discussion   Palliative care by specialist   CHF  (congestive heart failure) ([place])   Aspiration pneumonia due to gastric secretions (HCC)   NSTEMI (non-ST elevated myocardial infarction) ([place])   Altered mental status   Acute on chronic respiratory failure with hypoxia (HCC)   Acute hypoxic respiratory failure with ESBL Klebsiella aspiration pneumonia and pulmonary edema.  Overall stable needing minimal oxygen support.  Continue pulmonary support, aspiration precaution.  Keep n.p.o. strict with G tube feeding.  Aspiration pneumonia secondary to achalasia, patient completed antibiotic therapy.  Her T-max is 98.9, her WBC count is 7200 and stable. She is on reglan, ppi, G tube in place.  Hypernatremia: sod at 151 from 153. Was slowly creeping up previously.  Suspecting free water deficit.  Continue on tube feeding.  Family/son has refused free water flushes that patient was getting at 200 mL q 8 hr.  I will monitor additional day, seh did gain weight sicne admission,  Flush has been cut down to 30 mL every 8 hours, will repeat BMP in the morning to reassess for a free water flush need.  Abnormal LFTs; monitor  Encephalopathy due to multiple comorbidities , with failure to thrive, with complex comorbidity with pulmonary edema CHF with low EF, anemia, UTI aspiration pneumonia NSTEMI.  Patient remains confused agitated, was given Seroquel and this morning sleepy.  Will cut down the Seroquel t o12.5 mg nightly and hold for sedation.  Acute on chronic combined systolic and diastolic CHF with EF 40%:Stable currently.  Judicious use of free water flush tube feeding due to CHF.Patient does appear to be on dry side.Son is reluctant to give her free water flush today. Cont isosorbide-hydralazine.  Left temporal and frontal punctuate CVA, patient is to continue on statin and Plavix  as per neurology. Dopplers nondiagnostic as patient was uncooperative and TTE without evidence of vegetation/clot.  CKD stage III baseline creatinine about 11 months ago was in  2.0.Since admission has been variable from 1.1 to 1.9 and now in 2.3. Monitor closely.Very difficult situation as patient is completely dependent on tube feeding, and has very low EF, furthermore IV fluid can again trigger CHF exacerbation.  She may have component of cardiorenal syndrome.  Judicious use of free water per feeding.  Patient appears to be appropriate for palliative care however family wants to pursue aggressive measures. Avoid Lasix for now.  Enterococcus UTI, patient completed antibiotic.   Non-ST elevation MI, treated medically.Continue Plavix and statin, metoprolol.  Cut on the dose of metoprolol as her diastolic blood pressure was soft  Anemia of chronic diseasase.Suspect multifactorial, hemoglobin was 6.5 g 12/17 was given  1 unit PRBC 12/17 along with IV Lasix,Hb increased appropriately.  Monitor CBC.  Subcu has been discontinued as per son's request.We discussed about risk of VTE and family understands.  Moderate protein calorie moderation, continue tube feeding. Dietitian on board.  Diabetes mellitus blood sugar controlled,continue Lantus and sliding scale insulin.  Upper palate lesion with dark lesion with small vesicle.Continue symptomatic management, local spray and monitor.  MRSA screen positive monitor.  DVT prophylaxis: Plavix. Add scd, Heparin [place] stopped as per son' request Code Status: FULL Family Communication: Updated plan of care with son over the phone, and daughter at the bedside. Son requesting to hold off on subcu heparin understanding the risk of VTE.  Requesting to go down to FREE WATER flush. Overall patient is at high risk for decompensation, readmission.  Overall prognosis appears guarded.  Disposition Plan: SDU  Consultants: PCM, palliative care  Procedures: as above  Antimicrobials: completed abx.  Subjective: Blood pressure in high 37T to 062I systolic, diastolic in 94W. No acute events overnight.  Afebrile with T-max 98.9. Sodium remains  elevated at 151 from 153, creatinine slightly up at 2.23, HB is up at 7.7 gm after 1 unit PRBC 12/17. Free water flush held overnight due to family's request- pt has been on 200 ml q 8 hrs. Patient had received Seroquel and morphine overnight this morning resting comfortably/sleeping, daughter at bedside Able to mumble some. Spontaneously moving her extremities.  Objective: Vitals:   01/17/18 0020 01/17/18 0500 01/17/18 0553 01/17/18 0731  BP: (!) 97/38  (!) 101/45 (!) 105/48  Pulse: 65  75 70  Resp: 16  16   Temp: 97.7 F (36.5 C)   98.9 F (37.2 C)  TempSrc: Oral   Oral  SpO2: 100%   100%  Weight:  55.1 kg    Height:        Intake/Output Summary (Last 24 hours) at 01/17/2018 0819 Last data filed at 01/16/2018 1700 Gross per 24 hour  Intake 862 ml  Output -  Net 862 ml   Filed Weights   01/13/18 0600 01/16/18 0500 01/17/18 0500  Weight: 54.8 kg 53.7 kg 55.1 kg   Weight change: 1.4 kg  Body mass index is 21.52 kg/m.  Examination:  General exam: Appears lethargic, sick looking, elderly and frail HEENT:PERRL,Oral mucosa moist, Ear/Nose normal on gross exam Respiratory system: Bilateral equal air entry, normal vesicular breath sounds, no wheezes or crackles  Cardiovascular system: S1 & S2 heard,No JVD, murmurs.No pedal edema. Gastrointestinal system: Abdomen is  Soft, non tender, non distended, BS +  Nervous System: lethargic. moving extremities Extremities: No edema, no clubbing,distal peripheral pulses palpable. Skin: No  rashes, lesions,no icterus MSK: Normal muscle bulk,tone ,power  Data Reviewed: I have personally reviewed following labs and imaging studies  CBC: Recent Labs  Lab 01/11/18 0613 01/13/18 0642 01/14/18 1641 01/15/18 0534 01/16/18 0503 01/17/18 0505  WBC 8.7 8.9 9.5 9.4 8.4 7.2  NEUTROABS 7.3 7.2 7.7 7.9*  --  5.7  HGB 7.6* 7.0* 7.4* 7.1* 6.5* 7.7*  HCT 26.8* 24.5* 25.6* 24.7* 22.3* 26.2*  MCV 98.9 101.2* 100.0 100.8* 101.4* 97.8  PLT 175  178 200 177 170 382   Basic Metabolic Panel: Recent Labs  Lab 01/13/18 0642 01/14/18 1639 01/14/18 1641 01/15/18 0534 01/16/18 0503 01/17/18 0505  NA 150* 151*  --  152* 153* 151*  K 3.9 4.0  --  3.8 3.6 4.1  CL 109 110  --  110 111 110  CO2 32 30  --  30 31 30   GLUCOSE 217* 246*  --  207* 220* 158*  BUN 75* 84*  --  88* 96* 102*  CREATININE 1.74* 1.86*  --  1.98* 2.20* 2.23*  CALCIUM 9.1 9.4  --  9.6 9.8 9.0  MG  --   --  2.3 2.3  --   --   PHOS 3.4 3.2  --  3.0  --   --    GFR: Estimated Creatinine Clearance: 14.4 mL/min (A) (by C-G formula based on SCr of 2.23 mg/dL (H)). Liver Function Tests: Recent Labs  Lab 01/13/18 5053 01/14/18 1639 01/15/18 0534 01/16/18 0503  AST  --   --   --  93*  ALT  --   --   --  108*  ALKPHOS  --   --   --  105  BILITOT  --   --   --  0.9  PROT  --   --   --  6.6  ALBUMIN 1.6* 1.7* 2.3* 3.0*   No results for input(s): LIPASE, AMYLASE in the last 168 hours. No results for input(s): AMMONIA in the last 168 hours. Coagulation Profile: No results for input(s): INR, PROTIME in the last 168 hours. Cardiac Enzymes: Recent Labs  Lab 01/10/18 1427  TROPONINI 0.25*   BNP (last 3 results) No results for input(s): PROBNP in the last 8760 hours. HbA1C: No results for input(s): HGBA1C in the last 72 hours. CBG: Recent Labs  Lab 01/16/18 1632 01/16/18 1959 01/17/18 0011 01/17/18 0437 01/17/18 0727  GLUCAP 121* 116* 97 144* 138*   Lipid Profile: No results for input(s): CHOL, HDL, LDLCALC, TRIG, CHOLHDL, LDLDIRECT in the last 72 hours. Thyroid Function Tests: No results for input(s): TSH, T4TOTAL, FREET4, T3FREE, THYROIDAB in the last 72 hours. Anemia Panel: Recent Labs    01/16/18 0500 01/16/18 0503  VITAMINB12  --  667  FOLATE 17.2  --   FERRITIN  --  1,311*  TIBC  --  99*  IRON  --  29  RETICCTPCT  --  1.9   Sepsis Labs: No results for input(s): PROCALCITON, LATICACIDVEN in the last 168 hours.  No results found for  this or any previous visit (from the past 240 hour(s)).    Radiology Studies: No results found.   Scheduled Meds: . atorvastatin  40 mg Per Tube q1800  . chlorhexidine  15 mL Mouth Rinse BID  . cholecalciferol  400 Units Per Tube Daily  . clopidogrel  75 mg Per Tube Daily  . donepezil  5 mg Per Tube QHS  . ferrous sulfate  300 mg Per Tube Daily  . free water  200 mL Per Tube Q8H  . heparin injection (subcutaneous)  5,000 Units Subcutaneous Q8H  . insulin aspart  0-15 Units Subcutaneous Q4H  . insulin detemir  14 Units Subcutaneous Daily  . levalbuterol  0.63 mg Nebulization BID   And  . ipratropium  0.5 mg Nebulization BID  . isosorbide-hydrALAZINE  1 tablet Per Tube TID  . mouth rinse  15 mL Mouth Rinse q12n4p  . metoCLOPramide  5 mg Per Tube Q6H  . metoprolol tartrate  50 mg Per Tube BID  . multivitamin  15 mL Per Tube Daily  . pantoprazole sodium  40 mg Per Tube Q1200  . QUEtiapine  25 mg Per Tube QHS  . sennosides  5 mL Per Tube QHS   Continuous Infusions: . sodium chloride Stopped (01/08/18 2158)  . feeding supplement (VITAL AF 1.2 CAL) 1,000 mL (01/16/18 2023)     LOS: 39 days   Time spent: More than 50% of that time was spent in counseling and/or coordination of care.  Antonieta Pert, MD [facility] Pager [phone]  If 7PM-7AM, please contact night-coverage [email or web address] Password [name] 01/17/2018, 8:19 AM

## 2018-01-17 NOTE — Progress Notes (Signed)
Occupational Therapy Treatment Patient Details Name: [name] MRN: [id number] DOB: [birth year] Today's Date: 01/17/2018    History of present illness 84 yo female admitted on 12/05/17 with onset of UTI and AMS was admitted with ongoing weight loss and dehydration with FTT.  S/p G-tube placement on 12/10/17 with ongoing confusion.  Dx with acute respiratory failure with hypoxia due to pulmonary edema, NSTEMI possibly due to volume overload 11/21, acute HF, acute metabolic encepholopathy (per neurology: 3 punctate left temproal and left frontal infarcts, etiology unclear,  but will not explain pt encephalopathy), aspiration PNA 11/23, anemia (acute on chronic), sacral pressure wound.  Respiratory distress on 11/25 with pt combative vs. lethargy.  VDRF 12/2-12/6.  On Bipap on 12/7.  PMHx:  CAD, AKI, angina, cardiomegaly, CABG, CVA with L hemi, CKD 3, transfusion, pleural effusion, DM, achalasia with botox procedure.   OT comments  Upon arrival, pt supine in bed with daughter at bedside. Pt requiring Total A +2 for bed mobility and achieve sitting at EOB. Facilitating upright posture and sitting balance. However, grimacing and presenting with discomfort, so return to supine. Performing BUE and BLE PROM and stretching at bed level. Positioning pt to optimize comfort and elevating BUEs with pillows. Continue to recommend dc to post-acute rehab and will continue to follow acutely.    Follow Up Recommendations  SNF    Equipment Recommendations  Other (comment)(Defer to next venue)    Recommendations for Other Services Other (comment)(palliative)    Precautions / Restrictions Precautions Precautions: Fall Precaution Comments: coccygeal wound, Prevalon boots/ Peg 11/10 with abdominal binder, geo mat Restrictions Weight Bearing Restrictions: No       Mobility Bed Mobility Overal bed mobility: Needs Assistance Bed Mobility: Supine to Sit;Sit to Supine Rolling: Total assist Sidelying to sit:  Total assist;+2 for safety/equipment;+2 for physical assistance Supine to sit: Total assist;+2 for physical assistance;+2 for safety/equipment Sit to supine: Total assist;+2 for physical assistance;+2 for safety/equipment   General bed mobility comments: total assist including pt resisting sitting on side of bed  Transfers Overall transfer level: Needs assistance               General transfer comment: pt is too lethargic to attempt    Balance Overall balance assessment: Needs assistance Sitting-balance support: Feet supported;Bilateral upper extremity supported Sitting balance-Leahy Scale: Zero Sitting balance - Comments: pt is too resistant to get a clear effort to control sitting Postural control: Posterior lean;Right lateral lean;Left lateral lean;Other (comment)(forward lean at times)                                 ADL either performed or assessed with clinical judgement   ADL Overall ADL's : Needs assistance/impaired                                       General ADL Comments: Pt requirint Total A for ADLs and not responding appropiately to cues. Pt sitting at EOB with Max A for ~3 minutes.      Vision       Perception     Praxis      Cognition Arousal/Alertness: Lethargic Behavior During Therapy: Flat affect Overall Cognitive Status: Impaired/Different from baseline Area of Impairment: Following commands;Safety/judgement;Awareness;Problem solving                 Orientation Level: Situation  Current Attention Level: Selective Memory: Decreased recall of precautions;Decreased short-term memory Following Commands: Follows one step commands with increased time Safety/Judgement: Decreased awareness of safety;Decreased awareness of deficits Awareness: Intellectual Problem Solving: Slow processing;Difficulty sequencing;Requires verbal cues General Comments: pt yelling out withn eyes closed, not appropriately respoding to  verbal cues/stimulus        Exercises Exercises: General Upper Extremity;General Lower Extremity General Exercises - Upper Extremity Shoulder Flexion: PROM;Both;10 reps Shoulder Extension: PROM;Both;10 reps Shoulder ABduction: PROM;Both;10 reps Shoulder ADduction: PROM;Both;10 reps Elbow Flexion: PROM;Both;10 reps Elbow Extension: PROM;Both;10 reps Wrist Flexion: PROM;Both;10 reps Wrist Extension: PROM;Both;10 reps General Exercises - Lower Extremity Ankle Circles/Pumps: PROM;Both;5 reps Heel Slides: PROM;Both;10 reps Hip ABduction/ADduction: PROM;Both;10 reps Hip Flexion/Marching: PROM;Both;10 reps   Shoulder Instructions       General Comments Daughter present throughout    Pertinent Vitals/ Pain       Pain Assessment: Faces Faces Pain Scale: Hurts a little bit Pain Location: mild restlessness and no clear sign of any activity being a good mobility Pain Descriptors / Indicators: Sore Pain Intervention(s): Limited activity within patient's tolerance;Monitored during session;Repositioned  Home Living                                          Prior Functioning/Environment              Frequency  Min 2X/week        Progress Toward Goals  OT Goals(current goals can now be found in the care plan section)  Progress towards OT goals: Not progressing toward goals - comment  Acute Rehab OT Goals Patient Stated Goal: unable to state OT Goal Formulation: Patient unable to participate in goal setting Time For Goal Achievement: 01/03/18 Potential to Achieve Goals: Fair ADL Goals Pt Will Perform Eating: with set-up;with supervision;sitting Pt Will Perform Grooming: with mod assist;sitting Pt Will Perform Upper Body Bathing: sitting;with mod assist Pt Will Perform Upper Body Dressing: sitting;with mod assist Pt Will Perform Lower Body Dressing: sit to/from stand;with max assist Pt Will Transfer to Toilet: with mod assist;stand pivot transfer;bedside  commode;with +2 assist Pt Will Perform Toileting - Clothing Manipulation and hygiene: sitting/lateral leans;with max assist Additional ADL Goal #1: Pt will perform bed mobility with mod assist and maintain sitting balance at EOB with mod assist for 5 minutes as precursor for ADLs. Additional ADL Goal #2: Pt will follow 1 step commands with 75% accuracy given increased time in order to increase participation in ADLs.  Plan Discharge plan remains appropriate    Co-evaluation    PT/OT/SLP Co-Evaluation/Treatment: Yes Reason for Co-Treatment: Complexity of the patient's impairments (multi-system involvement);For patient/therapist safety;To address functional/ADL transfers   OT goals addressed during session: ADL's and self-care      AM-PAC OT "6 Clicks" Daily Activity     Outcome Measure   Help from another person eating meals?: Total Help from another person taking care of personal grooming?: Total Help from another person toileting, which includes using toliet, bedpan, or urinal?: Total Help from another person bathing (including washing, rinsing, drying)?: Total Help from another person to put on and taking off regular upper body clothing?: Total Help from another person to put on and taking off regular lower body clothing?: Total 6 Click Score: 6    End of Session Equipment Utilized During Treatment: Oxygen  OT Visit Diagnosis: Other abnormalities of gait and mobility (R26.89);Muscle weakness (generalized) (M62.81);Other  symptoms and signs involving cognitive function   Activity Tolerance Patient limited by lethargy;Patient limited by pain;Treatment limited secondary to agitation   Patient Left in bed;with call bell/phone within reach;with bed alarm set;with family/visitor present   Nurse Communication Mobility status        Time: 6433-2951 OT Time Calculation (min): 20 min  Charges: OT General Charges $OT Visit: 1 Visit OT Treatments $Self Care/Home Management : 8-22  mins  [place], OTR/L Acute Rehab Pager: [phone] Office: [place] 01/17/2018, 5:37 PM

## 2018-01-17 NOTE — Progress Notes (Signed)
SLP Cancellation Note  Patient Details Name: [name] MRN: [id number] DOB: 1927/04/02   Cancelled treatment:       Reason Eval/Treat Not Completed: Other (comment) RN reports pt is currently agitated; not a good time for therapy. SLP will f/u as able.   [name] 01/17/2018, 4:21 PM  [name], M.A. [place] Acute Environmental education officer [phone] Office [phone]

## 2018-01-17 NOTE — Progress Notes (Signed)
Family declined repositioning patient and a bath. Daughter said she rather do it about 9 am or 10 am. Will notify the incoming staff. Patient slept well. No incident of yelling or restlessness this shift.

## 2018-01-18 ENCOUNTER — Inpatient Hospital Stay (HOSPITAL_COMMUNITY): Payer: Medicare Other

## 2018-01-18 LAB — HEPATIC FUNCTION PANEL
ALT: 75 U/L — AB (ref 0–44)
AST: 41 U/L (ref 15–41)
Albumin: 2.5 g/dL — ABNORMAL LOW (ref 3.5–5.0)
Alkaline Phosphatase: 87 U/L (ref 38–126)
Bilirubin, Direct: 0.2 mg/dL (ref 0.0–0.2)
Indirect Bilirubin: 0.7 mg/dL (ref 0.3–0.9)
Total Bilirubin: 0.9 mg/dL (ref 0.3–1.2)
Total Protein: 6.1 g/dL — ABNORMAL LOW (ref 6.5–8.1)

## 2018-01-18 LAB — CBC
HCT: 26.9 % — ABNORMAL LOW (ref 36.0–46.0)
Hemoglobin: 8.3 g/dL — ABNORMAL LOW (ref 12.0–15.0)
MCH: 29.7 pg (ref 26.0–34.0)
MCHC: 30.9 g/dL (ref 30.0–36.0)
MCV: 96.4 fL (ref 80.0–100.0)
Platelets: 179 10*3/uL (ref 150–400)
RBC: 2.79 MIL/uL — ABNORMAL LOW (ref 3.87–5.11)
RDW: 19.7 % — ABNORMAL HIGH (ref 11.5–15.5)
WBC: 8.5 10*3/uL (ref 4.0–10.5)
nRBC: 0 % (ref 0.0–0.2)

## 2018-01-18 LAB — BASIC METABOLIC PANEL
Anion gap: 12 (ref 5–15)
BUN: 110 mg/dL — ABNORMAL HIGH (ref 8–23)
CO2: 29 mmol/L (ref 22–32)
Calcium: 9.6 mg/dL (ref 8.9–10.3)
Chloride: 111 mmol/L (ref 98–111)
Creatinine, Ser: 2.29 mg/dL — ABNORMAL HIGH (ref 0.44–1.00)
GFR calc Af Amer: 21 mL/min — ABNORMAL LOW (ref >60)
GFR calc non Af Amer: 18 mL/min — ABNORMAL LOW (ref >60)
Glucose, Bld: 126 mg/dL — ABNORMAL HIGH (ref 70–99)
Potassium: 3.8 mmol/L (ref 3.5–5.1)
Sodium: 152 mmol/L — ABNORMAL HIGH (ref 135–145)

## 2018-01-18 LAB — GLUCOSE, CAPILLARY
Glucose-Capillary: 115 mg/dL — ABNORMAL HIGH (ref 70–99)
Glucose-Capillary: 140 mg/dL — ABNORMAL HIGH (ref 70–99)
Glucose-Capillary: 159 mg/dL — ABNORMAL HIGH (ref 70–99)
Glucose-Capillary: 60 mg/dL — ABNORMAL LOW (ref 70–99)
Glucose-Capillary: 68 mg/dL — ABNORMAL LOW (ref 70–99)
Glucose-Capillary: 80 mg/dL (ref 70–99)
Glucose-Capillary: 91 mg/dL (ref 70–99)

## 2018-01-18 MED ORDER — BENZTROPINE MESYLATE 0.5 MG PO TABS
0.5000 mg | ORAL_TABLET | Freq: Two times a day (BID) | ORAL | Status: DC
  Administered 2018-01-18 – 2018-01-22 (×10): 0.5 mg
  Filled 2018-01-18 (×13): qty 1

## 2018-01-18 MED ORDER — DOCUSATE SODIUM 50 MG/5ML PO LIQD: 50.0000 mg | Freq: Two times a day (BID) | ORAL | Status: DC

## 2018-01-18 MED ORDER — SENNOSIDES 8.8 MG/5ML PO SYRP
5.0000 mL | ORAL_SOLUTION | Freq: Two times a day (BID) | ORAL | Status: DC
  Administered 2018-01-18 – 2018-01-22 (×10): 5 mL via ORAL
  Filled 2018-01-18 (×11): qty 5

## 2018-01-18 MED ORDER — FREE WATER
100.0000 mL | Freq: Three times a day (TID) | Status: DC
  Administered 2018-01-18 – 2018-01-19 (×3): 100 mL

## 2018-01-18 MED ORDER — POLYETHYLENE GLYCOL 3350 17 G PO PACK
17.0000 g | PACK | Freq: Every day | ORAL | Status: DC | PRN
  Administered 2018-01-19 – 2018-02-20 (×7): 17 g
  Filled 2018-01-18 (×9): qty 1

## 2018-01-18 MED ORDER — DEXTROSE 50 % IV SOLN
INTRAVENOUS | Status: AC
  Filled 2018-01-18: qty 50

## 2018-01-18 MED ORDER — DOCUSATE SODIUM 50 MG/5ML PO LIQD
100.0000 mg | Freq: Two times a day (BID) | ORAL | Status: DC
  Administered 2018-01-18 – 2018-01-25 (×15): 100 mg via ORAL
  Filled 2018-01-18 (×15): qty 10

## 2018-01-18 MED ORDER — QUETIAPINE FUMARATE 25 MG PO TABS
12.5000 mg | ORAL_TABLET | Freq: Two times a day (BID) | ORAL | Status: DC
  Administered 2018-01-18 – 2018-01-22 (×10): 12.5 mg
  Filled 2018-01-18 (×11): qty 1

## 2018-01-18 MED ORDER — DIPHENHYDRAMINE HCL 50 MG/ML IJ SOLN
12.5000 mg | Freq: Once | INTRAMUSCULAR | Status: AC
  Administered 2018-01-18: 12.5 mg via INTRAVENOUS
  Filled 2018-01-18: qty 1

## 2018-01-18 NOTE — Procedures (Signed)
History: 84 year old female being evaluated for altered mental status   Sedation: None  Technique: This is a 21 channel routine scalp EEG performed at the bedside with bipolar and monopolar montages arranged in accordance to the international 10/20 system of electrode placement. One channel was dedicated to EKG recording.    Background: The background consists of generalized irregular delta and theta activities.  There is a posterior dominant rhythm which is poorly formed and poorly sustained and achieves a maximal frequency of 6 to 7 Hz.  In addition there are occasional bifrontally predominant discharges with triphasic morphology.  Photic stimulation: Physiologic driving is not performed  EEG Abnormalities: 1) occasional triphasic waves 2) generalized irregular slow activity  Clinical Interpretation: This EEG is consistent with a generalized nonspecific cerebral dysfunction (encephalopathy).    Though nonspecific, triphasic waves can be associated with metabolic encephalopathy such as hepatic or uremic encephalopathy among other toxic/metabolic causes.   There was no seizure or seizure predisposition recorded on this study. Please note that lack of epileptiform activity on EEG does not preclude the possibility of epilepsy.   Roland Rack, MD [facility] [phone]  If 7pm- 7am, please page neurology on call as listed in [place].

## 2018-01-18 NOTE — Progress Notes (Signed)
EEG Completed; Results Pending  

## 2018-01-18 NOTE — Progress Notes (Signed)
PROGRESS NOTE    Rupinder Livingston  FVC:[id number] DOB: 01-02-1928 DOA: 12/05/2017 PCP: Deland Pretty, MD   Brief Narrative: .  Family history of diabetes, hypertension, hyperlipidemia, CAD status post CABG, history of CVA, CKD stage III who was recently diagnosed with achalasia and was being treated with Botox with no significant improvement.  She was admitted with altered mental status and was found to have difficulty speaking and finding words which then progressed to total aphasia patient has been hospitalized she had prolonged hospital course, major events listed as below with Events: 11/5 admit 11/10 G tube placed by IR 11/13 PMT consulted 11/20 neuro consult. Rapid response for respiratory distress, treated with lasix. 11/21 cardiology consult for NSTEMI. 11/23 rapid response for respiratory distress, treated with lasix and morphine 11/27 ENT consulted for stridor, flexible laryngoscopy negative 12/1 rapid response for respiratory distress, treated with morphine 12/2 PCCM consulted for agonal respirations, transferred to ICU for intubation 12/3 Stable right atelectasis and effusion 12/6 Extubated " Currently patient is remains bedbound, lethargic, intermittently agitated.  Palliative care had seen the patient, but family wanted to pursue aggressive measures, and they are following peripherally. Patient was transfused 1 unit of PRBC for chronic anemia 12/17.  As per family request patient was placed on Seroquel to help with agitation 12/17. patient was transferred back to progressive unit 12/18.  Assessment & Plan:   Principal Problem:   Dysarthria Active Problems:   Diabetes mellitus ([place])   Hyperlipidemia   Essential hypertension   Coronary atherosclerosis   Stroke (cerebrum) (HCC)   Stage 3 chronic kidney disease ([place])   Acute lower UTI   Adult failure to thrive   UTI (urinary tract infection)   DNR (do not resuscitate) discussion   Palliative care by specialist   CHF  (congestive heart failure) ([place])   Aspiration pneumonia due to gastric secretions (HCC)   NSTEMI (non-ST elevated myocardial infarction) ([place])   Altered mental status   Acute on chronic respiratory failure with hypoxia (HCC)   Acute hypoxic respiratory failure with ESBL Klebsiella aspiration pneumonia and pulmonary edema.  Overall stable needing minimal oxygen support.  Continue pulmonary support, aspiration precaution.  Keep n.p.o. strict with G tube feeding.  Aspiration pneumonia secondary to achalasia, patient completed antibiotic therapy.  Her T-max is 98.9, her WBC count is 7200 and stable. She is on reglan, ppi, G tube in place.  Hypernatremia: sod at 151 from 153. Was slowly creeping up previously.  Suspecting free water deficit.  Continue on tube feeding.  Family/son has refused free water flushes that patient was getting at 200 mL q 8 hr. Discuss with son now aggreable for free water 100 q8 hors monitor  Abnormal LFTs; monitor  Encephalopathy due to multiple comorbidities , with failure to thrive, with complex comorbidity with pulmonary edema CHF with low EF, anemia, UTI aspiration pneumonia NSTEMI.  Patient remains confused agitated,  Continue seroquel  EEG unremarkable.   Acute on chronic combined systolic and diastolic CHF with EF 63%:Stable currently.  Judicious use of free water flush tube feeding due to CHF.Patient does appear to be on dry side.  Cont isosorbide-hydralazine.  Left temporal and frontal punctuate CVA, patient is to continue on statin and Plavix as per neurology. Dopplers nondiagnostic as patient was uncooperative and TTE without evidence of vegetation/clot.  CKD stage III baseline creatinine about 11 months ago was in 2.0.Since admission has been variable from 1.1 to 1.9 and now in 2.3. Monitor closely.Very difficult situation as patient is completely  dependent on tube feeding, and has very low EF, furthermore IV fluid can again trigger CHF exacerbation.  She may  have component of cardiorenal syndrome.  Judicious use of free water per feeding.  Patient appears to be appropriate for palliative care however family wants to pursue aggressive measures. Avoid Lasix for now.  Enterococcus UTI, patient completed antibiotic.   Non-ST elevation MI, treated medically.Continue Plavix and statin, metoprolol.  Cut on the dose of metoprolol as her diastolic blood pressure was soft  Anemia of chronic diseasase.Suspect multifactorial, hemoglobin was 6.5 g 12/17 was given  1 unit PRBC 12/17 along with IV Lasix,Hb increased appropriately.  Monitor CBC.  Subcu has been discontinued as per son's request.We discussed about risk of VTE and family understands.  Moderate protein calorie moderation, continue tube feeding. Dietitian on board.  Diabetes mellitus blood sugar controlled,continue Lantus and sliding scale insulin.  Upper palate lesion with dark lesion with small vesicle.Continue symptomatic management, local spray and monitor.  MRSA screen positive monitor.  constipation  Increase bowel regimen   Tardive dyskinesia Add cogentin.  DVT prophylaxis: Plavix. Add scd, Heparin [place] stopped as per son' request Code Status: FULL Family Communication: Updated plan of care with son over the phone, and daughter at the bedside.  Overall patient is at high risk for decompensation, readmission.  Overall prognosis appears guarded.  Disposition Plan: SDU  Consultants: PCM, palliative care  Procedures: as above  Antimicrobials: completed abx.  Subjective: Blood pressure in high 36R to 443X systolic, diastolic in 54M. No acute events overnight.  Afebrile with T-max 98.9. Sodium remains elevated at 151 from 153, creatinine slightly up at 2.23, HB is up at 7.7 gm after 1 unit PRBC 12/17. Free water flush held overnight due to family's request- pt has been on 200 ml q 8 hrs. Patient had received Seroquel and morphine overnight this morning resting comfortably/sleeping,  daughter at bedside Able to mumble some. Spontaneously moving her extremities.  Objective: Vitals:   01/18/18 0725 01/18/18 0806 01/18/18 1709 01/18/18 2028  BP: (!) 159/78  (!) 145/68   Pulse: 87  77   Resp: 16     Temp: 98.1 F (36.7 C)  97.8 F (36.6 C)   TempSrc:   Oral   SpO2: 98% 98% 96% 99%  Weight:      Height:        Intake/Output Summary (Last 24 hours) at 01/18/2018 2155 Last data filed at 01/18/2018 0100 Gross per 24 hour  Intake 225 ml  Output -  Net 225 ml   Filed Weights   01/13/18 0600 01/16/18 0500 01/17/18 0500  Weight: 54.8 kg 53.7 kg 55.1 kg   Weight change:   Body mass index is 21.52 kg/m.  Examination:  General exam: Appears lethargic, sick looking, elderly and frail HEENT:PERRL,Oral mucosa moist, Ear/Nose normal on gross exam Respiratory system: Bilateral equal air entry, normal vesicular breath sounds, no wheezes or crackles  Cardiovascular system: S1 & S2 heard,No JVD, murmurs.No pedal edema. Gastrointestinal system: Abdomen is  Soft, non tender, non distended, BS +  Nervous System: lethargic. moving extremities Extremities: No edema, no clubbing,distal peripheral pulses palpable. Skin: No rashes, lesions,no icterus MSK: Normal muscle bulk,tone ,power  Data Reviewed: I have personally reviewed following labs and imaging studies  CBC: Recent Labs  Lab 01/13/18 0642 01/14/18 1641 01/15/18 0534 01/16/18 0503 01/17/18 0505 01/18/18 0123  WBC 8.9 9.5 9.4 8.4 7.2 8.5  NEUTROABS 7.2 7.7 7.9*  --  5.7  --   HGB  7.0* 7.4* 7.1* 6.5* 7.7* 8.3*  HCT 24.5* 25.6* 24.7* 22.3* 26.2* 26.9*  MCV 101.2* 100.0 100.8* 101.4* 97.8 96.4  PLT 178 200 177 170 163 956   Basic Metabolic Panel: Recent Labs  Lab 01/13/18 0642 01/14/18 1639 01/14/18 1641 01/15/18 0534 01/16/18 0503 01/17/18 0505 01/18/18 0123  NA 150* 151*  --  152* 153* 151* 152*  K 3.9 4.0  --  3.8 3.6 4.1 3.8  CL 109 110  --  110 111 110 111  CO2 32 30  --  30 31 30 29     GLUCOSE 217* 246*  --  207* 220* 158* 126*  BUN 75* 84*  --  88* 96* 102* 110*  CREATININE 1.74* 1.86*  --  1.98* 2.20* 2.23* 2.29*  CALCIUM 9.1 9.4  --  9.6 9.8 9.0 9.6  MG  --   --  2.3 2.3  --   --   --   PHOS 3.4 3.2  --  3.0  --   --   --    GFR: Estimated Creatinine Clearance: 14 mL/min (A) (by C-G formula based on SCr of 2.29 mg/dL (H)). Liver Function Tests: Recent Labs  Lab 01/13/18 2130 01/14/18 1639 01/15/18 0534 01/16/18 0503 01/18/18 0123  AST  --   --   --  93* 41  ALT  --   --   --  108* 75*  ALKPHOS  --   --   --  105 87  BILITOT  --   --   --  0.9 0.9  PROT  --   --   --  6.6 6.1*  ALBUMIN 1.6* 1.7* 2.3* 3.0* 2.5*   No results for input(s): LIPASE, AMYLASE in the last 168 hours. No results for input(s): AMMONIA in the last 168 hours. Coagulation Profile: No results for input(s): INR, PROTIME in the last 168 hours. Cardiac Enzymes: No results for input(s): CKTOTAL, CKMB, CKMBINDEX, TROPONINI in the last 168 hours. BNP (last 3 results) No results for input(s): PROBNP in the last 8760 hours. HbA1C: No results for input(s): HGBA1C in the last 72 hours. CBG: Recent Labs  Lab 01/18/18 0728 01/18/18 1159 01/18/18 1706 01/18/18 1815 01/18/18 1922  GLUCAP 140* 159* 60* 68* 80   Lipid Profile: No results for input(s): CHOL, HDL, LDLCALC, TRIG, CHOLHDL, LDLDIRECT in the last 72 hours. Thyroid Function Tests: No results for input(s): TSH, T4TOTAL, FREET4, T3FREE, THYROIDAB in the last 72 hours. Anemia Panel: Recent Labs    01/16/18 0500 01/16/18 0503  VITAMINB12  --  667  FOLATE 17.2  --   FERRITIN  --  1,311*  TIBC  --  99*  IRON  --  29  RETICCTPCT  --  1.9   Sepsis Labs: No results for input(s): PROCALCITON, LATICACIDVEN in the last 168 hours.  No results found for this or any previous visit (from the past 240 hour(s)).    Radiology Studies: Dg Abd Portable 1v  Result Date: 01/18/2018 CLINICAL DATA:  Constipation. EXAM: PORTABLE ABDOMEN  - 1 VIEW COMPARISON:  11/08/2017. FINDINGS: Normal bowel gas pattern. Mildly prominent stool. Lumbar and lower thoracic spine degenerative changes and mild to moderate dextroconvex rotary scoliosis. Gastrostomy or jejunostomy tube balloon overlying the mid to upper abdomen to the left of the upper lumbar spine. Diffuse arterial calcifications. IMPRESSION: No acute abnormality. Mildly prominent stool. Electronically Signed   By: Claudie Revering M.D.   On: 01/18/2018 13:48     Scheduled Meds: . atorvastatin  40  mg Per Tube q1800  . benztropine  0.5 mg Per Tube BID  . chlorhexidine  15 mL Mouth Rinse BID  . cholecalciferol  400 Units Per Tube Daily  . clopidogrel  75 mg Per Tube Daily  . dextrose      . docusate  100 mg Oral BID  . donepezil  5 mg Per Tube QHS  . ferrous sulfate  300 mg Per Tube Daily  . free water  100 mL Per Tube Q8H  . heparin injection (subcutaneous)  5,000 Units Subcutaneous Q8H  . insulin aspart  0-15 Units Subcutaneous Q4H  . insulin detemir  14 Units Subcutaneous Daily  . levalbuterol  0.63 mg Nebulization BID   And  . ipratropium  0.5 mg Nebulization BID  . isosorbide-hydrALAZINE  1 tablet Per Tube TID  . mouth rinse  15 mL Mouth Rinse q12n4p  . metoprolol tartrate  25 mg Per Tube BID  . multivitamin  15 mL Per Tube Daily  . pantoprazole sodium  40 mg Per Tube Q1200  . QUEtiapine  12.5 mg Per Tube BID  . sennosides  5 mL Oral BID   Continuous Infusions: . sodium chloride Stopped (01/08/18 2158)  . feeding supplement (VITAL AF 1.2 CAL) 1,000 mL (01/16/18 2023)     LOS: 40 days   Time spent: More than 50% of that time was spent in counseling and/or coordination of care.  Berle Mull, MD [facility]  If 7PM-7AM, please contact night-coverage [email or web address] Password [email or web address] 01/18/2018, 9:55 PM

## 2018-01-18 NOTE — Progress Notes (Signed)
RN was noticed by NT about low CBG of 60 at 1710. RN went and assessed the patient. Spoke with patient's son about low blood sugar. Explained to son about how patient's tube feeding came unhooked at around 1400 and spilled into the bed. Patient hooked back to tube feed at 1510.  Low blood sugar could have been because patient went some time without tube feed. Son was agreeable to wait to implement any interventions and to check CBG again in an hour. CBG rechecked at  1815 and resulted at 68. Patient was then given 4oz of juice. CBG rechecked at 1922 at resulted at 25. Information passed onto night RN.

## 2018-01-19 LAB — BASIC METABOLIC PANEL
Anion gap: 12 (ref 5–15)
BUN: 105 mg/dL — ABNORMAL HIGH (ref 8–23)
CO2: 29 mmol/L (ref 22–32)
Calcium: 9.6 mg/dL (ref 8.9–10.3)
Chloride: 112 mmol/L — ABNORMAL HIGH (ref 98–111)
Creatinine, Ser: 2.31 mg/dL — ABNORMAL HIGH (ref 0.44–1.00)
GFR calc Af Amer: 21 mL/min — ABNORMAL LOW (ref >60)
GFR calc non Af Amer: 18 mL/min — ABNORMAL LOW (ref >60)
Glucose, Bld: 134 mg/dL — ABNORMAL HIGH (ref 70–99)
Potassium: 3.6 mmol/L (ref 3.5–5.1)
SODIUM: 153 mmol/L — AB (ref 135–145)

## 2018-01-19 LAB — CBC
HCT: 26.8 % — ABNORMAL LOW (ref 36.0–46.0)
Hemoglobin: 8.2 g/dL — ABNORMAL LOW (ref 12.0–15.0)
MCH: 29.8 pg (ref 26.0–34.0)
MCHC: 30.6 g/dL (ref 30.0–36.0)
MCV: 97.5 fL (ref 80.0–100.0)
Platelets: 187 10*3/uL (ref 150–400)
RBC: 2.75 MIL/uL — ABNORMAL LOW (ref 3.87–5.11)
RDW: 19.6 % — AB (ref 11.5–15.5)
WBC: 7.6 10*3/uL (ref 4.0–10.5)
nRBC: 0 % (ref 0.0–0.2)

## 2018-01-19 LAB — GLUCOSE, CAPILLARY
Glucose-Capillary: 106 mg/dL — ABNORMAL HIGH (ref 70–99)
Glucose-Capillary: 122 mg/dL — ABNORMAL HIGH (ref 70–99)
Glucose-Capillary: 134 mg/dL — ABNORMAL HIGH (ref 70–99)
Glucose-Capillary: 166 mg/dL — ABNORMAL HIGH (ref 70–99)
Glucose-Capillary: 190 mg/dL — ABNORMAL HIGH (ref 70–99)
Glucose-Capillary: 91 mg/dL (ref 70–99)
Glucose-Capillary: 97 mg/dL (ref 70–99)

## 2018-01-19 MED ORDER — INSULIN DETEMIR 100 UNIT/ML ~~LOC~~ SOLN
12.0000 [IU] | Freq: Every day | SUBCUTANEOUS | Status: DC
  Administered 2018-01-19 – 2018-02-20 (×33): 12 [IU] via SUBCUTANEOUS
  Filled 2018-01-19 (×33): qty 0.12

## 2018-01-19 MED ORDER — SODIUM CHLORIDE 0.9 % IV BOLUS
250.0000 mL | Freq: Once | INTRAVENOUS | Status: AC
  Administered 2018-01-20: 250 mL via INTRAVENOUS

## 2018-01-19 MED ORDER — FREE WATER: 150.0000 mL | Freq: Three times a day (TID) | Status: DC

## 2018-01-19 MED ORDER — FREE WATER
200.0000 mL | Freq: Three times a day (TID) | Status: DC
  Administered 2018-01-19 – 2018-01-20 (×3): 200 mL

## 2018-01-19 NOTE — Progress Notes (Signed)
PROGRESS NOTE    [name]  OIN:[id number] DOB: 05/30/1927 DOA: 12/05/2017 PCP: Deland Pretty, MD   Brief Narrative: .  Family history of diabetes, hypertension, hyperlipidemia, CAD status post CABG, history of CVA, CKD stage III who was recently diagnosed with achalasia and was being treated with Botox with no significant improvement.  She was admitted with altered mental status and was found to have difficulty speaking and finding words which then progressed to total aphasia patient has been hospitalized she had prolonged hospital course, major events listed as below with Events: 11/5 admit 11/10 G tube placed by IR 11/13 PMT consulted 11/20 neuro consult. Rapid response for respiratory distress, treated with lasix. 11/21 cardiology consult for NSTEMI. 11/23 rapid response for respiratory distress, treated with lasix and morphine 11/27 ENT consulted for stridor, flexible laryngoscopy negative 12/1 rapid response for respiratory distress, treated with morphine 12/2 PCCM consulted for agonal respirations, transferred to ICU for intubation 12/3 Stable right atelectasis and effusion 12/6 Extubated " Currently patient is remains bedbound, lethargic, intermittently agitated.  Palliative care had seen the patient, but family wanted to pursue aggressive measures, and they are following peripherally. Patient was transfused 1 unit of PRBC for chronic anemia 12/17.  As per family request patient was placed on Seroquel to help with agitation 12/17. patient was transferred back to progressive unit 12/18.  Assessment & Plan: Acute hypoxic respiratory failure with ESBL Klebsiella aspiration pneumonia and pulmonary edema.   Overall stable needing minimal oxygen support.   Continue pulmonary support, aspiration precaution.   Keep n.p.o. strict with G tube feeding.  Aspiration pneumonia  secondary to achalasia,  patient completed antibiotic therapy.  Hypernatremia:  slowly creeping up  previously.   free water deficit of 2.3 L. Continue on tube feeding. Family/son has refused free water flushes in the past with a concern for worsening CHF needing ICU transfers. Discuss with son, with rising creatinine and BUN now aggreable for free water 200 q8 hors monitor  Abnormal LFTs; monitor  Acute encephalopathy due to multiple comorbidities , with failure to thrive,  with complex comorbidity with pulmonary edema CHF with low EF, anemia, UTI aspiration pneumonia NSTEMI, polypharmacy.   Patient remains confused agitated. Continue seroquel  EEG unremarkable, does show triphasic waves concerning for metabolic encephalopathy.   Acute on chronic combined systolic and diastolic CHF with EF 70%: Stable currently. Judicious use of free water flush tube feeding due to CHF. Patient does appear to be on dry side.  Cont isosorbide-hydralazine.  Left temporal and frontal punctuate CVA, patient is to continue on statin and Plavix as per neurology. Dopplers nondiagnostic as patient was uncooperative and TTE without evidence of vegetation/clot.  CKD stage III baseline creatinine about 11 months ago was in 2.0.Since admission has been variable from 1.1 to 1.9 and now in 2.3. Monitor closely.Very difficult situation as patient is completely dependent on tube feeding, and has very low EF, furthermore IV fluid can again trigger CHF exacerbation.  She may have component of cardiorenal syndrome.  Judicious use of free water per feeding.  Patient appears to be appropriate for palliative care however family wants to pursue aggressive measures. Avoid Lasix for now.  Enterococcus UTI, patient completed antibiotic.   Non-ST elevation MI, treated medically.Continue Plavix and statin, metoprolol.  Cut on the dose of metoprolol as her diastolic blood pressure was soft  Anemia of chronic diseasase.Suspect multifactorial, hemoglobin was 6.5 g 12/17 was given  1 unit PRBC 12/17 along with IV Lasix,Hb increased  appropriately.  Monitor CBC.  Subcu has been discontinued as per son's request.We discussed about risk of VTE and family understands.  Moderate protein calorie moderation, continue tube feeding. Dietitian on board.  Type 2 diabetes mellitus, uncontrolled with hyper and hypoglycemia. With renal complication. Blood sugar controlled,continue Lantus and sliding scale insulin. Due to low blood sugars, will down titrate Lantus from 14 units to 12 units and monitor.  Upper palate lesion with dark lesion with small vesicle.Continue symptomatic management, local spray and monitor.  MRSA screen positive monitor.  constipation  Increase bowel regimen   Tardive dyskinesia Add cogentin.  DVT prophylaxis: Plavix. Add scd, Heparin [place] stopped as per son' request Code Status: FULL Family Communication: Updated plan of care with son over the phone, and daughter at the bedside.  Overall patient is at high risk for decompensation, readmission.  Overall prognosis appears guarded.  Disposition Plan: Continue to remain inpatient until metabolic abnormality and encephalopathy resolves.  Consultants: PCM, palliative care  Procedures: as above  Antimicrobials: completed abx.  Subjective: Somewhat better than yesterday.  No nausea no vomiting.  Tube feeding remain at goal, although came off on hold yesterday for a brief few hours.  Objective: Vitals:   01/18/18 2028 01/18/18 2305 01/19/18 0747 01/19/18 0845  BP:  119/66  (!) 127/59  Pulse:  70  73  Resp:  (!) 21    Temp:  (!) 97.5 F (36.4 C)  97.8 F (36.6 C)  TempSrc:  Oral  Axillary  SpO2: 99% 100% 100% 100%  Weight:      Height:        Intake/Output Summary (Last 24 hours) at 01/19/2018 1428 Last data filed at 01/19/2018 0930 Gross per 24 hour  Intake 1072 ml  Output 400 ml  Net 672 ml   Filed Weights   01/13/18 0600 01/16/18 0500 01/17/18 0500  Weight: 54.8 kg 53.7 kg 55.1 kg   Weight change:   Body mass index is 21.52  kg/m.  Examination:  General exam: Appears lethargic, sick looking, elderly and frail HEENT:PERRL,Oral mucosa moist, Ear/Nose normal on gross exam Respiratory system: Bilateral equal air entry, normal vesicular breath sounds, no wheezes or crackles  Cardiovascular system: S1 & S2 heard,No JVD, murmurs.No pedal edema. Gastrointestinal system: Abdomen is  Soft, non tender, non distended, BS +  Nervous System: lethargic. moving extremities Extremities: No edema, no clubbing,distal peripheral pulses palpable. Skin: No rashes, lesions,no icterus MSK: Normal muscle bulk,tone ,power  Data Reviewed: I have personally reviewed following labs and imaging studies  CBC: Recent Labs  Lab 01/13/18 0642 01/14/18 1641 01/15/18 0534 01/16/18 0503 01/17/18 0505 01/18/18 0123 01/19/18 0236  WBC 8.9 9.5 9.4 8.4 7.2 8.5 7.6  NEUTROABS 7.2 7.7 7.9*  --  5.7  --   --   HGB 7.0* 7.4* 7.1* 6.5* 7.7* 8.3* 8.2*  HCT 24.5* 25.6* 24.7* 22.3* 26.2* 26.9* 26.8*  MCV 101.2* 100.0 100.8* 101.4* 97.8 96.4 97.5  PLT 178 200 177 170 163 179 846   Basic Metabolic Panel: Recent Labs  Lab 01/13/18 0642 01/14/18 1639 01/14/18 1641 01/15/18 0534 01/16/18 0503 01/17/18 0505 01/18/18 0123 01/19/18 0236  NA 150* 151*  --  152* 153* 151* 152* 153*  K 3.9 4.0  --  3.8 3.6 4.1 3.8 3.6  CL 109 110  --  110 111 110 111 112*  CO2 32 30  --  30 31 30 29 29   GLUCOSE 217* 246*  --  207* 220* 158* 126* 134*  BUN 75* 84*  --  88* 96* 102* 110* 105*  CREATININE 1.74* 1.86*  --  1.98* 2.20* 2.23* 2.29* 2.31*  CALCIUM 9.1 9.4  --  9.6 9.8 9.0 9.6 9.6  MG  --   --  2.3 2.3  --   --   --   --   PHOS 3.4 3.2  --  3.0  --   --   --   --    GFR: Estimated Creatinine Clearance: 13.9 mL/min (A) (by C-G formula based on SCr of 2.31 mg/dL (H)). Liver Function Tests: Recent Labs  Lab 01/13/18 9622 01/14/18 1639 01/15/18 0534 01/16/18 0503 01/18/18 0123  AST  --   --   --  93* 41  ALT  --   --   --  108* 75*  ALKPHOS   --   --   --  105 87  BILITOT  --   --   --  0.9 0.9  PROT  --   --   --  6.6 6.1*  ALBUMIN 1.6* 1.7* 2.3* 3.0* 2.5*   No results for input(s): LIPASE, AMYLASE in the last 168 hours. No results for input(s): AMMONIA in the last 168 hours. Coagulation Profile: No results for input(s): INR, PROTIME in the last 168 hours. Cardiac Enzymes: No results for input(s): CKTOTAL, CKMB, CKMBINDEX, TROPONINI in the last 168 hours. BNP (last 3 results) No results for input(s): PROBNP in the last 8760 hours. HbA1C: No results for input(s): HGBA1C in the last 72 hours. CBG: Recent Labs  Lab 01/18/18 1922 01/19/18 0106 01/19/18 0453 01/19/18 0838 01/19/18 1138  GLUCAP 80 106* 122* 134* 190*   Lipid Profile: No results for input(s): CHOL, HDL, LDLCALC, TRIG, CHOLHDL, LDLDIRECT in the last 72 hours. Thyroid Function Tests: No results for input(s): TSH, T4TOTAL, FREET4, T3FREE, THYROIDAB in the last 72 hours. Anemia Panel: No results for input(s): VITAMINB12, FOLATE, FERRITIN, TIBC, IRON, RETICCTPCT in the last 72 hours. Sepsis Labs: No results for input(s): PROCALCITON, LATICACIDVEN in the last 168 hours.  No results found for this or any previous visit (from the past 240 hour(s)).    Radiology Studies: Dg Abd Portable 1v  Result Date: 01/18/2018 CLINICAL DATA:  Constipation. EXAM: PORTABLE ABDOMEN - 1 VIEW COMPARISON:  11/08/2017. FINDINGS: Normal bowel gas pattern. Mildly prominent stool. Lumbar and lower thoracic spine degenerative changes and mild to moderate dextroconvex rotary scoliosis. Gastrostomy or jejunostomy tube balloon overlying the mid to upper abdomen to the left of the upper lumbar spine. Diffuse arterial calcifications. IMPRESSION: No acute abnormality. Mildly prominent stool. Electronically Signed   By: Claudie Revering M.D.   On: 01/18/2018 13:48     Scheduled Meds: . atorvastatin  40 mg Per Tube q1800  . benztropine  0.5 mg Per Tube BID  . chlorhexidine  15 mL Mouth  Rinse BID  . cholecalciferol  400 Units Per Tube Daily  . clopidogrel  75 mg Per Tube Daily  . docusate  100 mg Oral BID  . donepezil  5 mg Per Tube QHS  . ferrous sulfate  300 mg Per Tube Daily  . free water  200 mL Per Tube Q8H  . insulin aspart  0-15 Units Subcutaneous Q4H  . insulin detemir  12 Units Subcutaneous Daily  . levalbuterol  0.63 mg Nebulization BID   And  . ipratropium  0.5 mg Nebulization BID  . isosorbide-hydrALAZINE  1 tablet Per Tube TID  . mouth rinse  15 mL Mouth Rinse q12n4p  . metoprolol tartrate  25  mg Per Tube BID  . multivitamin  15 mL Per Tube Daily  . pantoprazole sodium  40 mg Per Tube Q1200  . QUEtiapine  12.5 mg Per Tube BID  . sennosides  5 mL Oral BID   Continuous Infusions: . sodium chloride Stopped (01/08/18 2158)  . feeding supplement (VITAL AF 1.2 CAL) 1,000 mL (01/16/18 2023)     LOS: 41 days   Time spent: More than 50% of that time was spent in counseling and/or coordination of care.  Berle Mull, MD [facility]  If 7PM-7AM, please contact night-coverage [email or web address] Password [email or web address] 01/19/2018, 2:28 PM

## 2018-01-19 NOTE — Progress Notes (Signed)
  Speech Language Pathology Treatment: Dysphagia  Patient Details Name: [name] MRN: [id number] DOB: 15-Nov-1927 Today's Date: 01/19/2018 Time: 0923-3007 SLP Time Calculation (min) (ACUTE ONLY): 11 min  Assessment / Plan / Recommendation Clinical Impression  Pt still is not cognitively appropriate for POs. She needs frequent repositioning and resists oral care. She was not able to take in any POs despite various delivery methods and Max multimodal cueing. Will given limited changes in mentation, which is her biggest barrier to PO intake, and that she is nutritionally supported by alternative means, will adjust frequency to 1x/week.   HPI HPI: 84 y.o. female with CAD s/p CABG, recent ischemic stroke, HTN, DM, HLP, recent failure to thrive due to swallowing problems s/p botox injection for achalasia, developed acute pulmonary edema and NSTEMI on 12/20/2017. Bedside swallow on 11/05/17 revealed functional oropharyngeal swallow and suspected esophageal dysphagia. Had esophagram 11/06/17 which showed moderately large hiatal hernia with poor motility and retained barium throughout the esophagus. PEG 12/10/17.  Repeat bedside swallow eval 12/23/17 with recs for NPO secondary to poor MS, no oral anticipation/recognition of POs. Pt with no change in MS that would allow safe oral intake between 11/23 and date of intubation,12/2.  Extubated 12/6. New orders for repeat bedside swallow eval s/p extubation.      SLP Plan  Continue with current plan of care       Recommendations  Diet recommendations: NPO Medication Administration: Via alternative means                Oral Care Recommendations: Oral care QID Follow up Recommendations: Skilled Nursing facility SLP Visit Diagnosis: Dysphagia, unspecified (R13.10) Plan: Continue with current plan of care       GO                [name] 01/19/2018, 4:58 PM  [name], M.A. [place] Acute Environmental education officer  [phone] Office [phone]

## 2018-01-20 LAB — BASIC METABOLIC PANEL
Anion gap: 11 (ref 5–15)
Anion gap: 11 (ref 5–15)
Anion gap: 10 (ref 5–15)
BUN: 105 mg/dL — ABNORMAL HIGH (ref 8–23)
BUN: 108 mg/dL — AB (ref 8–23)
BUN: 103 mg/dL — ABNORMAL HIGH (ref 8–23)
CO2: 32 mmol/L (ref 22–32)
CO2: 27 mmol/L (ref 22–32)
CO2: 30 mmol/L (ref 22–32)
Calcium: 9.2 mg/dL (ref 8.9–10.3)
Calcium: 9.1 mg/dL (ref 8.9–10.3)
Calcium: 8.9 mg/dL (ref 8.9–10.3)
Chloride: 112 mmol/L — ABNORMAL HIGH (ref 98–111)
Chloride: 112 mmol/L — ABNORMAL HIGH (ref 98–111)
Chloride: 106 mmol/L (ref 98–111)
Creatinine, Ser: 2.33 mg/dL — ABNORMAL HIGH (ref 0.44–1.00)
Creatinine, Ser: 2.23 mg/dL — ABNORMAL HIGH (ref 0.44–1.00)
Creatinine, Ser: 2.2 mg/dL — ABNORMAL HIGH (ref 0.44–1.00)
GFR calc Af Amer: 21 mL/min — ABNORMAL LOW (ref >60)
GFR calc Af Amer: 22 mL/min — ABNORMAL LOW (ref >60)
GFR calc Af Amer: 22 mL/min — ABNORMAL LOW (ref >60)
GFR calc non Af Amer: 18 mL/min — ABNORMAL LOW (ref >60)
GFR calc non Af Amer: 19 mL/min — ABNORMAL LOW (ref >60)
GFR calc non Af Amer: 19 mL/min — ABNORMAL LOW (ref >60)
Glucose, Bld: 162 mg/dL — ABNORMAL HIGH (ref 70–99)
Glucose, Bld: 193 mg/dL — ABNORMAL HIGH (ref 70–99)
Glucose, Bld: 283 mg/dL — ABNORMAL HIGH (ref 70–99)
Potassium: 3.7 mmol/L (ref 3.5–5.1)
Potassium: 4.2 mmol/L (ref 3.5–5.1)
Potassium: 3.8 mmol/L (ref 3.5–5.1)
Sodium: 155 mmol/L — ABNORMAL HIGH (ref 135–145)
Sodium: 150 mmol/L — ABNORMAL HIGH (ref 135–145)
Sodium: 146 mmol/L — ABNORMAL HIGH (ref 135–145)

## 2018-01-20 LAB — CBC
HCT: 26.8 % — ABNORMAL LOW (ref 36.0–46.0)
Hemoglobin: 7.8 g/dL — ABNORMAL LOW (ref 12.0–15.0)
MCH: 29 pg (ref 26.0–34.0)
MCHC: 29.1 g/dL — ABNORMAL LOW (ref 30.0–36.0)
MCV: 99.6 fL (ref 80.0–100.0)
Platelets: 172 10*3/uL (ref 150–400)
RBC: 2.69 MIL/uL — AB (ref 3.87–5.11)
RDW: 19.3 % — ABNORMAL HIGH (ref 11.5–15.5)
WBC: 7.1 10*3/uL (ref 4.0–10.5)
nRBC: 0 % (ref 0.0–0.2)

## 2018-01-20 LAB — GLUCOSE, CAPILLARY
Glucose-Capillary: 135 mg/dL — ABNORMAL HIGH (ref 70–99)
Glucose-Capillary: 134 mg/dL — ABNORMAL HIGH (ref 70–99)
Glucose-Capillary: 163 mg/dL — ABNORMAL HIGH (ref 70–99)
Glucose-Capillary: 163 mg/dL — ABNORMAL HIGH (ref 70–99)
Glucose-Capillary: 131 mg/dL — ABNORMAL HIGH (ref 70–99)

## 2018-01-20 LAB — MAGNESIUM: Magnesium: 2.4 mg/dL (ref 1.7–2.4)

## 2018-01-20 MED ORDER — ISOSORB DINITRATE-HYDRALAZINE 20-37.5 MG PO TABS
0.5000 | ORAL_TABLET | Freq: Three times a day (TID) | ORAL | Status: DC
  Filled 2018-01-20: qty 1

## 2018-01-20 MED ORDER — DEXTROSE 5 % IV SOLN
INTRAVENOUS | Status: DC
  Administered 2018-01-20: 10:00:00 via INTRAVENOUS

## 2018-01-20 MED ORDER — FREE WATER
25.0000 mL | Freq: Three times a day (TID) | Status: DC
  Administered 2018-01-20 – 2018-01-21 (×2): 25 mL

## 2018-01-20 MED ORDER — POLYETHYLENE GLYCOL 3350 17 G PO PACK
17.0000 g | PACK | Freq: Every day | ORAL | Status: DC
  Administered 2018-01-20 – 2018-01-22 (×3): 17 g
  Filled 2018-01-20 (×2): qty 1

## 2018-01-20 NOTE — Progress Notes (Signed)
Vitals were recorded for the patient @ 2330.  BP was 103/45 (60).  On call MD was paged.  Bodenheimer called this RN and stated he would write an order for a bolus of normal saline 250 mL.  This RN followed the order and started a bolus of normal saline 250 mL.    Approximately 10 minutes after the bolus started, the patient's daughter walked into the room and asked why her mother was receiving fluid.  This RN explained the patient's BP was low and the on call MD was paged.  He wanted the patient to receive fluid with the objective of raising the blood pressure.  The daughter was upset about her mother receiving additional fluid.  The daughter said "There were complications when my mother was first admitted because a doctor ordered too much fluid.  My mother went into fluid overload which caused her to have a heart attack."  This RN tried to explain 250 mL was not a great deal of fluid.  The daughter was adamant that the fluid be stopped.  She said Dr. Posey Pronto didn't want her mother to receive any fluids without his permission.    This RN discontinued the fluids and took the patient's BP again.  At [place], the patient's BP was 108/47 (66).

## 2018-01-20 NOTE — Progress Notes (Addendum)
PROGRESS NOTE    [name]  GQQ:[id number] DOB: 12/01/1927 DOA: 12/05/2017 PCP: Deland Pretty, MD   Brief Narrative: Pt with PMH of diabetes, hypertension, hyperlipidemia, CAD status post CABG, history of CVA, CKD stage III who was recently diagnosed with achalasia and was being treated with Botox with no significant improvement.  She was admitted with altered mental status and was found to have difficulty speaking and finding words which then progressed to total aphasia patient has been hospitalized she had prolonged hospital course, major events listed as below with  Events: 11/5 admit 11/10 G tube placed by IR 11/13 PMT consulted 11/20 neuro consult. Rapid response for respiratory distress, treated with lasix. 11/21 cardiology consult for NSTEMI. 11/23 rapid response for respiratory distress, treated with lasix and morphine 11/27 ENT consulted for stridor, flexible laryngoscopy negative 12/1 rapid response for respiratory distress, treated with morphine 12/2 PCCM consulted for agonal respirations, transferred to ICU for intubation 12/3 Stable right atelectasis and effusion 12/6 Extubated " Currently patient is remains bedbound, lethargic, intermittently agitated.  Palliative care had seen the patient, but family wanted to pursue aggressive measures, and they are following peripherally. Patient was transfused 1 unit of PRBC for chronic anemia 12/17.  As per family request patient was placed on Seroquel to help with agitation 12/17. patient was transferred back to progressive unit 12/18.  Assessment & Plan: Acute hypoxic respiratory failure with ESBL Klebsiella aspiration pneumonia and pulmonary edema.   Overall stable needing minimal oxygen support.   Continue pulmonary support, aspiration precaution.   Keep n.p.o. strict with G tube feeding.  Aspiration pneumonia  secondary to achalasia,  patient completed antibiotic therapy.  Hypernatremia:  slowly creeping up.   free  water deficit of 2.3 L. Continue on tube feeding. Family/son has refused free water flushes in the past with a concern for worsening CHF needing ICU transfers. Discuss with son, with rising creatinine and BUN agreeable to use D5.  Will monitor sodium every 4 hours.  Abnormal LFTs; monitor  Acute encephalopathy due to multiple comorbidities , with failure to thrive,  with complex comorbidity with pulmonary edema CHF with low EF, anemia, UTI aspiration pneumonia NSTEMI, polypharmacy.   Patient remains confused agitated. Continue seroquel  EEG unremarkable, does show triphasic waves concerning for metabolic encephalopathy. Would prefer to stop Aricept after discussion with family.  Acute on chronic combined systolic and diastolic CHF with EF 67%: Essential hypertension Stable currently. Judicious use of free water flush tube feeding due to CHF. Patient does appear to be on dry side.  Avoid fluid boluses. Systolic blood pressure of 90 or diastolic blood pressure of 40 is okay as long as the map is more than 60. Discontinue BiDil.  Left temporal and frontal punctuate CVA, patient is to continue on statin and Plavix as per neurology. Dopplers nondiagnostic as patient was uncooperative and TTE without evidence of vegetation/clot.  CKD stage III baseline creatinine about 11 months ago was in 2.0.Since admission has been variable from 1.1 to 1.9 and now in 2.3. Monitor closely.Very difficult situation as patient is completely dependent on tube feeding, and has very low EF, furthermore IV fluid can again trigger CHF exacerbation.  She may have component of cardiorenal syndrome.  Judicious use of free water per feeding.  Patient appears to be appropriate for palliative care however family wants to pursue aggressive measures. Avoid Lasix for now.  Enterococcus UTI, patient completed antibiotic.   Non-ST elevation MI, treated medically.Continue Plavix and statin, metoprolol.  Cut on the  dose of  metoprolol as her diastolic blood pressure was soft  Anemia of chronic diseasase.Suspect multifactorial, hemoglobin was 6.5 g 12/17 was given  1 unit PRBC 12/17 along with IV Lasix,Hb increased appropriately.  Monitor CBC.  Subcu has been discontinued as per son's request.We discussed about risk of VTE and family understands.  Moderate protein calorie moderation, continue tube feeding. Dietitian on board.  Type 2 diabetes mellitus, uncontrolled with hyper and hypoglycemia. With renal complication. Blood sugar controlled,continue Lantus and sliding scale insulin. continue lantus 12 units and monitor.  Upper palate lesion with dark lesion with small vesicle.Continue symptomatic management, local spray and monitor.  MRSA screen positive monitor.  constipation  Increase bowel regimen again.   Tardive dyskinesia Add cogentin.  DVT prophylaxis: Plavix. Add scd, Heparin [place] stopped as per son' request Code Status: FULL Family Communication: Updated plan of care with son over the phone, and daughter at the bedside.  Overall patient is at high risk for decompensation, readmission.  Overall prognosis appears guarded.  Disposition Plan: Continue to remain inpatient until metabolic abnormality and encephalopathy resolves.  Consultants: PCM, palliative care  Procedures: as above  Antimicrobials: completed abx.  Subjective: Somewhat better than yesterday.  No nausea no vomiting.  Tube feeding remain at goal, although came off on hold yesterday for a brief few hours.  Objective: Vitals:   01/19/18 2331 01/20/18 0120 01/20/18 0735 01/20/18 1147  BP: (!) 103/45 (!) 108/47 (!) 100/43   Pulse: 64  66   Resp: (!) 22  12   Temp: (!) 97.5 F (36.4 C)  98.4 F (36.9 C)   TempSrc: Oral  Oral   SpO2:   100% 100%  Weight:      Height:        Intake/Output Summary (Last 24 hours) at 01/20/2018 1454 Last data filed at 01/20/2018 0959 Gross per 24 hour  Intake 100 ml  Output 1450 ml  Net  -1350 ml   Filed Weights   01/13/18 0600 01/16/18 0500 01/17/18 0500  Weight: 54.8 kg 53.7 kg 55.1 kg   Weight change:   Body mass index is 21.52 kg/m.  Examination:  General exam: Appears lethargic, sick looking, elderly and frail HEENT:PERRL,Oral mucosa moist, Ear/Nose normal on gross exam Respiratory system: Bilateral equal air entry, normal vesicular breath sounds, no wheezes or crackles  Cardiovascular system: S1 & S2 heard,No JVD, murmurs.No pedal edema. Gastrointestinal system: Abdomen is  Soft, non tender, non distended, BS +  Nervous System: lethargic. moving extremities Extremities: No edema, no clubbing,distal peripheral pulses palpable. Skin: No rashes, lesions,no icterus MSK: Normal muscle bulk,tone ,power  Data Reviewed: I have personally reviewed following labs and imaging studies  CBC: Recent Labs  Lab 01/14/18 1641 01/15/18 0534 01/16/18 0503 01/17/18 0505 01/18/18 0123 01/19/18 0236 01/20/18 0503  WBC 9.5 9.4 8.4 7.2 8.5 7.6 7.1  NEUTROABS 7.7 7.9*  --  5.7  --   --   --   HGB 7.4* 7.1* 6.5* 7.7* 8.3* 8.2* 7.8*  HCT 25.6* 24.7* 22.3* 26.2* 26.9* 26.8* 26.8*  MCV 100.0 100.8* 101.4* 97.8 96.4 97.5 99.6  PLT 200 177 170 163 179 187 449   Basic Metabolic Panel: Recent Labs  Lab 01/14/18 1639 01/14/18 1641 01/15/18 0534 01/16/18 0503 01/17/18 0505 01/18/18 0123 01/19/18 0236 01/20/18 0503  NA 151*  --  152* 153* 151* 152* 153* 155*  K 4.0  --  3.8 3.6 4.1 3.8 3.6 3.7  CL 110  --  110 111 110 111  112* 112*  CO2 30  --  30 31 30 29 29  32  GLUCOSE 246*  --  207* 220* 158* 126* 134* 162*  BUN 84*  --  88* 96* 102* 110* 105* 105*  CREATININE 1.86*  --  1.98* 2.20* 2.23* 2.29* 2.31* 2.33*  CALCIUM 9.4  --  9.6 9.8 9.0 9.6 9.6 9.2  MG  --  2.3 2.3  --   --   --   --  2.4  PHOS 3.2  --  3.0  --   --   --   --   --    GFR: Estimated Creatinine Clearance: 13.8 mL/min (A) (by C-G formula based on SCr of 2.33 mg/dL (H)). Liver Function Tests: Recent  Labs  Lab 01/14/18 1639 01/15/18 0534 01/16/18 0503 01/18/18 0123  AST  --   --  93* 41  ALT  --   --  108* 75*  ALKPHOS  --   --  105 87  BILITOT  --   --  0.9 0.9  PROT  --   --  6.6 6.1*  ALBUMIN 1.7* 2.3* 3.0* 2.5*   No results for input(s): LIPASE, AMYLASE in the last 168 hours. No results for input(s): AMMONIA in the last 168 hours. Coagulation Profile: No results for input(s): INR, PROTIME in the last 168 hours. Cardiac Enzymes: No results for input(s): CKTOTAL, CKMB, CKMBINDEX, TROPONINI in the last 168 hours. BNP (last 3 results) No results for input(s): PROBNP in the last 8760 hours. HbA1C: No results for input(s): HGBA1C in the last 72 hours. CBG: Recent Labs  Lab 01/19/18 1957 01/19/18 2349 01/20/18 0557 01/20/18 0731 01/20/18 1123  GLUCAP 97 91 135* 134* 163*   Lipid Profile: No results for input(s): CHOL, HDL, LDLCALC, TRIG, CHOLHDL, LDLDIRECT in the last 72 hours. Thyroid Function Tests: No results for input(s): TSH, T4TOTAL, FREET4, T3FREE, THYROIDAB in the last 72 hours. Anemia Panel: No results for input(s): VITAMINB12, FOLATE, FERRITIN, TIBC, IRON, RETICCTPCT in the last 72 hours. Sepsis Labs: No results for input(s): PROCALCITON, LATICACIDVEN in the last 168 hours.  No results found for this or any previous visit (from the past 240 hour(s)).    Radiology Studies: No results found.   Scheduled Meds: . atorvastatin  40 mg Per Tube q1800  . benztropine  0.5 mg Per Tube BID  . chlorhexidine  15 mL Mouth Rinse BID  . cholecalciferol  400 Units Per Tube Daily  . clopidogrel  75 mg Per Tube Daily  . docusate  100 mg Oral BID  . donepezil  5 mg Per Tube QHS  . ferrous sulfate  300 mg Per Tube Daily  . free water  25 mL Per Tube Q8H  . insulin aspart  0-15 Units Subcutaneous Q4H  . insulin detemir  12 Units Subcutaneous Daily  . levalbuterol  0.63 mg Nebulization BID   And  . ipratropium  0.5 mg Nebulization BID  . mouth rinse  15 mL Mouth  Rinse q12n4p  . metoprolol tartrate  25 mg Per Tube BID  . multivitamin  15 mL Per Tube Daily  . pantoprazole sodium  40 mg Per Tube Q1200  . polyethylene glycol  17 g Per Tube Daily  . QUEtiapine  12.5 mg Per Tube BID  . sennosides  5 mL Oral BID   Continuous Infusions: . sodium chloride Stopped (01/08/18 2158)  . dextrose 40 mL/hr at 01/20/18 1019  . feeding supplement (VITAL AF 1.2 CAL) 1,000 mL (01/20/18  0701)     LOS: 42 days   Time spent: More than 50% of that time was spent in counseling and/or coordination of care.  Berle Mull, MD [facility]  If 7PM-7AM, please contact night-coverage [email or web address] Password [facility] 01/20/2018, 2:54 PM

## 2018-01-21 LAB — CBC
HCT: 27.3 % — ABNORMAL LOW (ref 36.0–46.0)
Hemoglobin: 7.9 g/dL — ABNORMAL LOW (ref 12.0–15.0)
MCH: 29 pg (ref 26.0–34.0)
MCHC: 28.9 g/dL — ABNORMAL LOW (ref 30.0–36.0)
MCV: 100.4 fL — ABNORMAL HIGH (ref 80.0–100.0)
NRBC: 0 % (ref 0.0–0.2)
Platelets: 173 10*3/uL (ref 150–400)
RBC: 2.72 MIL/uL — ABNORMAL LOW (ref 3.87–5.11)
RDW: 18.9 % — ABNORMAL HIGH (ref 11.5–15.5)
WBC: 8.5 10*3/uL (ref 4.0–10.5)

## 2018-01-21 LAB — BASIC METABOLIC PANEL
Anion gap: 13 (ref 5–15)
BUN: 107 mg/dL — ABNORMAL HIGH (ref 8–23)
CO2: 29 mmol/L (ref 22–32)
CREATININE: 2.33 mg/dL — AB (ref 0.44–1.00)
Calcium: 9 mg/dL (ref 8.9–10.3)
Chloride: 109 mmol/L (ref 98–111)
GFR calc Af Amer: 21 mL/min — ABNORMAL LOW (ref >60)
GFR calc non Af Amer: 18 mL/min — ABNORMAL LOW (ref >60)
Glucose, Bld: 205 mg/dL — ABNORMAL HIGH (ref 70–99)
Potassium: 3.8 mmol/L (ref 3.5–5.1)
Sodium: 151 mmol/L — ABNORMAL HIGH (ref 135–145)

## 2018-01-21 LAB — GLUCOSE, CAPILLARY
Glucose-Capillary: 105 mg/dL — ABNORMAL HIGH (ref 70–99)
Glucose-Capillary: 131 mg/dL — ABNORMAL HIGH (ref 70–99)
Glucose-Capillary: 140 mg/dL — ABNORMAL HIGH (ref 70–99)
Glucose-Capillary: 177 mg/dL — ABNORMAL HIGH (ref 70–99)
Glucose-Capillary: 179 mg/dL — ABNORMAL HIGH (ref 70–99)
Glucose-Capillary: 190 mg/dL — ABNORMAL HIGH (ref 70–99)
Glucose-Capillary: 234 mg/dL — ABNORMAL HIGH (ref 70–99)

## 2018-01-21 MED ORDER — BISACODYL 10 MG RE SUPP
10.0000 mg | Freq: Once | RECTAL | Status: AC
  Administered 2018-01-21: 10 mg via RECTAL
  Filled 2018-01-21: qty 1

## 2018-01-21 MED ORDER — FREE WATER
100.0000 mL | Freq: Three times a day (TID) | Status: DC
  Administered 2018-01-21 (×2): 100 mL

## 2018-01-21 MED ORDER — FREE WATER: 200.0000 mL | Freq: Three times a day (TID) | Status: DC

## 2018-01-21 MED ORDER — FREE WATER
200.0000 mL | Freq: Three times a day (TID) | Status: DC
  Administered 2018-01-21 – 2018-01-23 (×6): 200 mL

## 2018-01-21 NOTE — Progress Notes (Signed)
Pt is currently awake, alert to self and family and able to do serial additions. She has been somnolent and nonverbal on dayshift for the past 3 days. Her son was present to see her lucid.

## 2018-01-21 NOTE — Progress Notes (Signed)
PROGRESS NOTE    [name]  RCV:[id number] DOB: July 25, 1927 DOA: 12/05/2017 PCP: Deland Pretty, MD   Brief Narrative: Pt with PMH of diabetes, hypertension, hyperlipidemia, CAD status post CABG, history of CVA, CKD stage III who was recently diagnosed with achalasia and was being treated with Botox with no significant improvement.  She was admitted with altered mental status and was found to have difficulty speaking and finding words which then progressed to total aphasia patient has been hospitalized she had prolonged hospital course, major events listed as below with  Events: 11/5 admit 11/10 G tube placed by IR 11/13 PMT consulted 11/20 neuro consult. Rapid response for respiratory distress, treated with lasix. 11/21 cardiology consult for NSTEMI. 11/23 rapid response for respiratory distress, treated with lasix and morphine 11/27 ENT consulted for stridor, flexible laryngoscopy negative 12/1 rapid response for respiratory distress, treated with morphine 12/2 PCCM consulted for agonal respirations, transferred to ICU for intubation 12/3 Stable right atelectasis and effusion 12/6 Extubated " Currently patient is remains bedbound, lethargic, intermittently agitated.  Palliative care had seen the patient, but family wanted to pursue aggressive measures, and they are following peripherally. Patient was transfused 1 unit of PRBC for chronic anemia 12/17.  As per family request patient was placed on Seroquel to help with agitation 12/17. patient was transferred back to progressive unit 12/18.  Assessment & Plan: Acute hypoxic respiratory failure with ESBL Klebsiella aspiration pneumonia and pulmonary edema.   Overall stable needing minimal oxygen support.   Continue pulmonary support, aspiration precaution.   Keep n.p.o. strict with G tube feeding.  Aspiration pneumonia  secondary to achalasia,  patient completed antibiotic therapy.  Hypernatremia:  slowly creeping up.   free  water deficit of 2.3 L. Continue on tube feeding. Family/son has refused free water flushes in the past with a concern for worsening CHF needing ICU transfers. Discuss with son, with rising creatinine and BUN agreeable to use D5.   Sodium is trending down, will change back to free water 200 q8 hors.   Abnormal LFTs; monitor  Acute encephalopathy due to multiple comorbidities , with failure to thrive,  with complex comorbidity with pulmonary edema CHF with low EF, anemia, UTI aspiration pneumonia NSTEMI, polypharmacy.   Patient remains confused agitated. Continue seroquel  EEG unremarkable, does show triphasic waves concerning for metabolic encephalopathy. Would prefer to stop Aricept after discussion with family.  Acute on chronic combined systolic and diastolic CHF with EF 10%: Essential hypertension Stable currently. Judicious use of free water flush tube feeding due to CHF. Patient does appear to be on dry side.  Avoid fluid boluses. Systolic blood pressure of 90 or diastolic blood pressure of 40 is okay as long as the map is more than 60. Discontinue BiDil.  Left temporal and frontal punctuate CVA, patient is to continue on statin and Plavix as per neurology. Dopplers nondiagnostic as patient was uncooperative and TTE without evidence of vegetation/clot.  CKD stage III baseline creatinine about 11 months ago was in 2.0.Since admission has been variable from 1.1 to 1.9 and now in 2.3. Monitor closely.Very difficult situation as patient is completely dependent on tube feeding, and has very low EF, furthermore IV fluid can again trigger CHF exacerbation.  She may have component of cardiorenal syndrome.  Judicious use of free water per feeding.  Patient appears to be appropriate for palliative care however family wants to pursue aggressive measures. Avoid Lasix for now.  Enterococcus UTI, patient completed antibiotic.   Non-ST elevation MI, treated  medically.Continue Plavix and statin,  metoprolol.  Cut on the dose of metoprolol as her diastolic blood pressure was soft  Anemia of chronic diseasase.Suspect multifactorial, hemoglobin was 6.5 g 12/17 was given  1 unit PRBC 12/17 along with IV Lasix,Hb increased appropriately.  Monitor CBC.  Subcu has been discontinued as per son's request.We discussed about risk of VTE and family understands.  Moderate protein calorie moderation, continue tube feeding. Dietitian on board.  Type 2 diabetes mellitus, uncontrolled with hyper and hypoglycemia. With renal complication. Blood sugar controlled,continue Lantus and sliding scale insulin. continue lantus 12 units and monitor.  Upper palate lesion with dark lesion with small vesicle.Continue symptomatic management, local spray and monitor.  MRSA screen positive monitor.  constipation  Increase bowel regimen again.   Tardive dyskinesia Add cogentin.  DVT prophylaxis: Plavix. Add scd, Heparin [place] stopped as per son' request Code Status: FULL D/w son on 01/21/2018, wants to continue full code.   Family Communication: Updated plan of care with son over the phone, and daughter at the bedside.  Overall patient is at high risk for decompensation, readmission.  Overall prognosis appears guarded.  Disposition Plan: Continue to remain inpatient until metabolic abnormality and encephalopathy resolves.  Consultants: PCM, palliative care  Procedures: as above  Antimicrobials: completed abx.  Subjective: Somewhat better than yesterday.  No nausea no vomiting.  Tube feeding remain at goal, although came off on hold yesterday for a brief few hours.  Objective: Vitals:   01/20/18 2209 01/21/18 0725 01/21/18 1152 01/21/18 1713  BP: 108/60 (!) 141/56  (!) 146/67  Pulse: 78 77  70  Resp: 20 16  16   Temp: 98.5 F (36.9 C) 98.2 F (36.8 C)  97.6 F (36.4 C)  TempSrc: Oral Oral  Oral  SpO2: 97% 100% 100% 95%  Weight:      Height:        Intake/Output Summary (Last 24 hours) at  01/21/2018 1825 Last data filed at 01/21/2018 1800 Gross per 24 hour  Intake 2080.42 ml  Output 900 ml  Net 1180.42 ml   Filed Weights   01/13/18 0600 01/16/18 0500 01/17/18 0500  Weight: 54.8 kg 53.7 kg 55.1 kg   Weight change:   Body mass index is 21.52 kg/m.  Examination:  General exam: Appears lethargic, sick looking, elderly and frail HEENT:PERRL,Oral mucosa moist, Ear/Nose normal on gross exam Respiratory system: Bilateral equal air entry, normal vesicular breath sounds, no wheezes or crackles  Cardiovascular system: S1 & S2 heard,No JVD, murmurs.No pedal edema. Gastrointestinal system: Abdomen is  Soft, non tender, non distended, BS +  Nervous System: lethargic. moving extremities Extremities: No edema, no clubbing,distal peripheral pulses palpable. Skin: No rashes, lesions,no icterus MSK: Normal muscle bulk,tone ,power  Data Reviewed: I have personally reviewed following labs and imaging studies  CBC: Recent Labs  Lab 01/15/18 0534  01/17/18 0505 01/18/18 0123 01/19/18 0236 01/20/18 0503 01/21/18 0500  WBC 9.4   < > 7.2 8.5 7.6 7.1 8.5  NEUTROABS 7.9*  --  5.7  --   --   --   --   HGB 7.1*   < > 7.7* 8.3* 8.2* 7.8* 7.9*  HCT 24.7*   < > 26.2* 26.9* 26.8* 26.8* 27.3*  MCV 100.8*   < > 97.8 96.4 97.5 99.6 100.4*  PLT 177   < > 163 179 187 172 173   < > = values in this interval not displayed.   Basic Metabolic Panel: Recent Labs  Lab 01/15/18 0534  01/19/18 0236 01/20/18 0503 01/20/18 1533 01/20/18 2149 01/21/18 1428  NA 152*   < > 153* 155* 150* 146* 151*  K 3.8   < > 3.6 3.7 4.2 3.8 3.8  CL 110   < > 112* 112* 112* 106 109  CO2 30   < > 29 32 27 30 29   GLUCOSE 207*   < > 134* 162* 193* 283* 205*  BUN 88*   < > 105* 105* 108* 103* 107*  CREATININE 1.98*   < > 2.31* 2.33* 2.23* 2.20* 2.33*  CALCIUM 9.6   < > 9.6 9.2 9.1 8.9 9.0  MG 2.3  --   --  2.4  --   --   --   PHOS 3.0  --   --   --   --   --   --    < > = values in this interval not  displayed.   GFR: Estimated Creatinine Clearance: 13.8 mL/min (A) (by C-G formula based on SCr of 2.33 mg/dL (H)). Liver Function Tests: Recent Labs  Lab 01/15/18 0534 01/16/18 0503 01/18/18 0123  AST  --  93* 41  ALT  --  108* 75*  ALKPHOS  --  105 87  BILITOT  --  0.9 0.9  PROT  --  6.6 6.1*  ALBUMIN 2.3* 3.0* 2.5*   No results for input(s): LIPASE, AMYLASE in the last 168 hours. No results for input(s): AMMONIA in the last 168 hours. Coagulation Profile: No results for input(s): INR, PROTIME in the last 168 hours. Cardiac Enzymes: No results for input(s): CKTOTAL, CKMB, CKMBINDEX, TROPONINI in the last 168 hours. BNP (last 3 results) No results for input(s): PROBNP in the last 8760 hours. HbA1C: No results for input(s): HGBA1C in the last 72 hours. CBG: Recent Labs  Lab 01/21/18 0009 01/21/18 0355 01/21/18 0721 01/21/18 1156 01/21/18 1706  GLUCAP 105* 140* 179* 234* 190*   Lipid Profile: No results for input(s): CHOL, HDL, LDLCALC, TRIG, CHOLHDL, LDLDIRECT in the last 72 hours. Thyroid Function Tests: No results for input(s): TSH, T4TOTAL, FREET4, T3FREE, THYROIDAB in the last 72 hours. Anemia Panel: No results for input(s): VITAMINB12, FOLATE, FERRITIN, TIBC, IRON, RETICCTPCT in the last 72 hours. Sepsis Labs: No results for input(s): PROCALCITON, LATICACIDVEN in the last 168 hours.  No results found for this or any previous visit (from the past 240 hour(s)).    Radiology Studies: No results found.   Scheduled Meds: . atorvastatin  40 mg Per Tube q1800  . benztropine  0.5 mg Per Tube BID  . chlorhexidine  15 mL Mouth Rinse BID  . cholecalciferol  400 Units Per Tube Daily  . clopidogrel  75 mg Per Tube Daily  . docusate  100 mg Oral BID  . ferrous sulfate  300 mg Per Tube Daily  . free water  200 mL Per Tube Q8H  . insulin aspart  0-15 Units Subcutaneous Q4H  . insulin detemir  12 Units Subcutaneous Daily  . levalbuterol  0.63 mg Nebulization BID    And  . ipratropium  0.5 mg Nebulization BID  . mouth rinse  15 mL Mouth Rinse q12n4p  . metoprolol tartrate  25 mg Per Tube BID  . multivitamin  15 mL Per Tube Daily  . pantoprazole sodium  40 mg Per Tube Q1200  . polyethylene glycol  17 g Per Tube Daily  . QUEtiapine  12.5 mg Per Tube BID  . sennosides  5 mL Oral BID   Continuous  Infusions: . sodium chloride Stopped (01/08/18 2158)  . feeding supplement (VITAL AF 1.2 CAL) 1,000 mL (01/20/18 0701)     LOS: 43 days   Time spent: More than 50% of that time was spent in counseling and/or coordination of care.  Berle Mull, MD [facility]  If 7PM-7AM, please contact night-coverage [email or web address] Password [facility] 01/21/2018, 6:25 PM

## 2018-01-22 LAB — COMPREHENSIVE METABOLIC PANEL
ALT: 32 U/L (ref 0–44)
AST: 24 U/L (ref 15–41)
Albumin: 2.1 g/dL — ABNORMAL LOW (ref 3.5–5.0)
Alkaline Phosphatase: 62 U/L (ref 38–126)
Anion gap: 9 (ref 5–15)
BUN: 111 mg/dL — ABNORMAL HIGH (ref 8–23)
CHLORIDE: 108 mmol/L (ref 98–111)
CO2: 31 mmol/L (ref 22–32)
Calcium: 9 mg/dL (ref 8.9–10.3)
Creatinine, Ser: 2.41 mg/dL — ABNORMAL HIGH (ref 0.44–1.00)
GFR calc non Af Amer: 17 mL/min — ABNORMAL LOW (ref >60)
GFR, EST AFRICAN AMERICAN: 20 mL/min — AB (ref >60)
Glucose, Bld: 211 mg/dL — ABNORMAL HIGH (ref 70–99)
Potassium: 4.4 mmol/L (ref 3.5–5.1)
Sodium: 148 mmol/L — ABNORMAL HIGH (ref 135–145)
Total Bilirubin: 0.6 mg/dL (ref 0.3–1.2)
Total Protein: 6.2 g/dL — ABNORMAL LOW (ref 6.5–8.1)

## 2018-01-22 LAB — CBC
HCT: 26.6 % — ABNORMAL LOW (ref 36.0–46.0)
Hemoglobin: 7.9 g/dL — ABNORMAL LOW (ref 12.0–15.0)
MCH: 30.2 pg (ref 26.0–34.0)
MCHC: 29.7 g/dL — AB (ref 30.0–36.0)
MCV: 101.5 fL — ABNORMAL HIGH (ref 80.0–100.0)
Platelets: 176 10*3/uL (ref 150–400)
RBC: 2.62 MIL/uL — ABNORMAL LOW (ref 3.87–5.11)
RDW: 18.8 % — ABNORMAL HIGH (ref 11.5–15.5)
WBC: 7.7 10*3/uL (ref 4.0–10.5)
nRBC: 0 % (ref 0.0–0.2)

## 2018-01-22 LAB — GLUCOSE, CAPILLARY
Glucose-Capillary: 148 mg/dL — ABNORMAL HIGH (ref 70–99)
Glucose-Capillary: 180 mg/dL — ABNORMAL HIGH (ref 70–99)
Glucose-Capillary: 200 mg/dL — ABNORMAL HIGH (ref 70–99)
Glucose-Capillary: 186 mg/dL — ABNORMAL HIGH (ref 70–99)
Glucose-Capillary: 168 mg/dL — ABNORMAL HIGH (ref 70–99)
Glucose-Capillary: 160 mg/dL — ABNORMAL HIGH (ref 70–99)

## 2018-01-22 LAB — MAGNESIUM: MAGNESIUM: 2.3 mg/dL (ref 1.7–2.4)

## 2018-01-22 LAB — PHOSPHORUS: PHOSPHORUS: 4.4 mg/dL (ref 2.5–4.6)

## 2018-01-22 LAB — BASIC METABOLIC PANEL
Anion gap: 11 (ref 5–15)
BUN: 114 mg/dL — ABNORMAL HIGH (ref 8–23)
CO2: 30 mmol/L (ref 22–32)
Calcium: 9.2 mg/dL (ref 8.9–10.3)
Chloride: 109 mmol/L (ref 98–111)
Creatinine, Ser: 2.5 mg/dL — ABNORMAL HIGH (ref 0.44–1.00)
GFR calc Af Amer: 19 mL/min — ABNORMAL LOW (ref >60)
GFR calc non Af Amer: 17 mL/min — ABNORMAL LOW (ref >60)
Glucose, Bld: 218 mg/dL — ABNORMAL HIGH (ref 70–99)
Potassium: 4.5 mmol/L (ref 3.5–5.1)
Sodium: 150 mmol/L — ABNORMAL HIGH (ref 135–145)

## 2018-01-22 LAB — PREALBUMIN: Prealbumin: 12.7 mg/dL — ABNORMAL LOW (ref 18–38)

## 2018-01-22 LAB — OCCULT BLOOD X 1 CARD TO LAB, STOOL: Fecal Occult Bld: NEGATIVE

## 2018-01-22 MED ORDER — LACTULOSE 10 GM/15ML PO SOLN
10.0000 g | Freq: Once | ORAL | Status: AC
  Administered 2018-01-22: 10 g via ORAL
  Filled 2018-01-22: qty 15

## 2018-01-22 MED ORDER — SODIUM CHLORIDE 0.45 % IV SOLN
INTRAVENOUS | Status: DC
  Administered 2018-01-22 – 2018-01-27 (×3): via INTRAVENOUS

## 2018-01-22 MED ORDER — VITAL AF 1.2 CAL PO LIQD
1000.0000 mL | ORAL | Status: DC
  Administered 2018-01-22 – 2018-01-27 (×5): 1000 mL
  Filled 2018-01-22 (×7): qty 1000

## 2018-01-22 NOTE — Progress Notes (Addendum)
Nutrition Follow-up  DOCUMENTATION CODES:   Non-severe (moderate) malnutrition in context of chronic illness, Underweight  INTERVENTION:    Vital AF 1.2 at goal rate of 55 ml/hr  Provides 1584 kcals, 99 gm protein, and 1070 ml free water daily    Liquid MVI daily via tube  NUTRITION DIAGNOSIS:   Moderate Malnutrition related to chronic illness(dysphagia 2/2 to achalasia) as evidenced by energy intake < or equal to 75% for > or equal to 1 month, mild fat depletion, moderate fat depletion, mild muscle depletion, moderate muscle depletion, energy intake < or equal to 50% for > or equal to 1 month, ongoing  GOAL:   Patient will meet greater than or equal to 90% of their needs, met  MONITOR:   TF tolerance, Labs, Skin, Weight trends, I & O's  ASSESSMENT:   [name] is a 84 y.o. female with medical history significant for DM2, HTN, HLD, CAD s/p CABG, h/o CVA, CKD3, who was recently diagnosed with achalasia after a 50 lb weight loss over the past year. She was treated with botox earlier in October and sent to SNF with the hopes she would be able to swallow her food. She has not done well since that time, has only been able to drink small amounts of milk. Son is a Management consultant in [place] and has been visiting. Yesterday he noticed she was stuttering, having difficulty getting words out, and progressed until she seemed to have total aphasia last night. She does report having dysuria and some lower abd pain. No N/V. Has a good appetite but avoids food because of the obstruction. Megace and remeron have not helped. Process of being worked up for G tube has been initiated and pt reportedly has appt with [facility] tomorrow morning. Son feels like her weight loss and FTT could be reversible if she gets a G tube as she was reportedly very functional a month ago, walking with a walker and performing all of her ADLs.   11/7 - per IR notes, imaging revealed moderate sized hiatal hernia and  colon in upper abdomen, recommending evaluating anatomy with fluoroscopy 11/8 - PICC placed 11/10 - TPN started, IR placed G-tube 11/11 - G-tube cleared for use by RI, TF initiated 11/13 - TPN d/c 11/20 - transitioned to bolus feedings 11/23 - s/p BSE, recommend continued NPO due to AMS, emesis with bolus feedings, rapid response called 11/25 - rapid response, tube feeds held and restarted in the afternoon at 20 ml/hr 11/27 - per MD, tube feeds at 35 ml/hr during the daytime and 30 ml/hr overnight 12/1 - rapid response for increased WOB 12/2 - emesis, TF turned off, intubated 12/6 extubated, BiPAP at night  Pt remains confused and agitated. Vital AF 1.2 currently infusing at 55 ml/hr via PEG tube. She is still not cognitively appropriate for PO diet per SLP.  Free water flushes restarted at 200 ml every 8 hrs 12/22. Labs & medications reviewed. Na 148 (H). CBG's [id number].  Diet Order:   Diet Order            Diet NPO time specified  Diet effective now             EDUCATION NEEDS:   Not appropriate for education at this time  Skin:  Skin Assessment: Skin Integrity Issues: Skin Integrity Issues:: Stage II Stage II: left buttocks, sacrum Other: partial thickness wounds to bilateral buttocks (likely from adhesive)  Last BM:  12/23    Intake/Output Summary (Last 24  hours) at 01/22/2018 1123 Last data filed at 01/22/2018 0659 Gross per 24 hour  Intake 1483 ml  Output 300 ml  Net 1183 ml   Height:   Ht Readings from Last 1 Encounters:  12/20/17 _0  (1.6 m)   Weight:   Wt Readings from Last 1 Encounters:  01/17/18 55.1 kg   Ideal Body Weight:  52.3 kg  BMI:  Body mass index is 21.52 kg/m.  Estimated Nutritional Needs:   Kcal:  1450-1650  Protein:  75-90 gm  Fluid:  >/= 1.5 L  Arthur Holms, RD, LDN Pager #: [phone] After-Hours Pager #: [phone]

## 2018-01-22 NOTE — Progress Notes (Signed)
PROGRESS NOTE    [name]  GGE:[id number] DOB: 1927-03-19 DOA: 12/05/2017 PCP: Deland Pretty, MD   Brief Narrative: Pt with PMH of diabetes, hypertension, hyperlipidemia, CAD status post CABG, history of CVA, CKD stage III who was recently diagnosed with achalasia and was being treated with Botox with no significant improvement.  She was admitted with altered mental status and was found to have difficulty speaking and finding words which then progressed to total aphasia patient has been hospitalized she had prolonged hospital course, major events listed as below with  Events: 11/5 admit 11/10 G tube placed by IR 11/13 PMT consulted 11/20 neuro consult. Rapid response for respiratory distress, treated with lasix. 11/21 cardiology consult for NSTEMI. 11/23 rapid response for respiratory distress, treated with lasix and morphine 11/27 ENT consulted for stridor, flexible laryngoscopy negative 12/1 rapid response for respiratory distress, treated with morphine 12/2 PCCM consulted for agonal respirations, transferred to ICU for intubation 12/3 Stable right atelectasis and effusion 12/6 Extubated " Currently patient is remains bedbound, lethargic, intermittently agitated.  Palliative care had seen the patient, but family wanted to pursue aggressive measures, and they are following peripherally. Patient was transfused 1 unit of PRBC for chronic anemia 12/17.  As per family request patient was placed on Seroquel to help with agitation 12/17. patient was transferred back to progressive unit 12/18.  Assessment & Plan: Acute hypoxic respiratory failure with ESBL Klebsiella aspiration pneumonia and pulmonary edema.   Overall stable needing minimal oxygen support.   Continue pulmonary support, aspiration precaution.   Keep n.p.o. strict with G tube feeding.  Aspiration pneumonia  secondary to achalasia,  patient completed antibiotic therapy.  Hypernatremia:  Progressively worsening while  the patient was undergoing treatment for CHF free water deficit of 2.3 L. Continue on tube feeding. Family/son has refused free water flushes in the past with a concern for worsening CHF needing ICU transfers. Discuss with son, with rising creatinine and BUN agreeable to use D5.   Sodium is trending down, will change back to free water 200 q8 hors.   Abnormal LFTs; monitor  Acute encephalopathy due to multiple comorbidities , with failure to thrive,  with complex comorbidity with pulmonary edema CHF with low EF, anemia, UTI aspiration pneumonia NSTEMI, polypharmacy.   Patient remains confused agitated. Continue seroquel  EEG unremarkable, does show triphasic waves concerning for metabolic encephalopathy. Would prefer to stop Aricept after discussion with family.  Acute on chronic combined systolic and diastolic CHF with EF 46%: Essential hypertension Stable currently. Judicious use of free water flush tube feeding due to CHF. Patient does appear to be on dry side.  Avoid fluid boluses unless necessary. Systolic blood pressure of 90 or diastolic blood pressure of 40 is okay as long as the map is more than 60. Discontinue BiDil.  Left temporal and frontal punctuate CVA, patient is to continue on statin and Plavix as per neurology. Dopplers nondiagnostic as patient was uncooperative and TTE without evidence of vegetation/clot.  Acute kidney injury on CKD stage III  Prerenal etiology. Baseline creatinine about 11 months ago was in 2.0.Since admission has been variable from 1.1 to 1.9 and now in 2.3. Monitor closely. Very difficult situation as patient is completely dependent on tube feeding, and has very low EF, furthermore IV fluid can again trigger CHF exacerbation.   She may have component of cardiorenal syndrome.   Judicious use of free water per feeding.   Patient appears to be appropriate for palliative care however family wants to pursue  aggressive measures. Avoid Lasix for  now.  Enterococcus UTI, patient completed antibiotic.   Non-ST elevation MI, treated medically.Continue Plavix and statin, metoprolol.  Cut on the dose of metoprolol as her diastolic blood pressure was soft  Anemia of chronic diseasase.Suspect multifactorial, hemoglobin was 6.5 g 12/17 was given  1 unit PRBC 12/17 along with IV Lasix,Hb increased appropriately.  Monitor CBC.  Subcu has been discontinued as per son's request.We discussed about risk of VTE and family understands.  Moderate protein calorie moderation, continue tube feeding. Dietitian on board.  Type 2 diabetes mellitus, uncontrolled with hyper and hypoglycemia. With renal complication. Blood sugar controlled,continue Lantus and sliding scale insulin. continue lantus 12 units and monitor.  Upper palate lesion with dark lesion with small vesicle.Continue symptomatic management, local spray and monitor.  MRSA screen positive monitor.  constipation  Increase bowel regimen again.  Patient is starting to have small bowel movements today.  Will continue current bowel regimen. Anticipating significant improvement in BUN after resolution of constipation. 1 dose of lactulose per tube.  Tardive dyskinesia Add cogentin.  DVT prophylaxis: Plavix. Add scd, Heparin Badger stopped as per son' request Code Status: FULL D/w son on 01/21/2018, wants to continue full code.   Family Communication: Updated plan of care with son over the phone, and daughter at the bedside.  Overall patient is at high risk for decompensation, readmission.  Overall prognosis appears guarded.  Disposition Plan: Continue to remain inpatient until metabolic abnormality and encephalopathy resolves.  Consultants: PCM, palliative care  Procedures: as above  Antimicrobials: completed abx.  Subjective: Still lethargic, occasionally follows command.  No nausea no vomiting.  Speaks incoherently.  Objective: Vitals:   01/21/18 2106 01/21/18 2235 01/22/18 0839  01/22/18 1612  BP:  129/68 (!) 119/49 126/63  Pulse:  68 69 76  Resp:  18  14  Temp:  (!) 97.3 F (36.3 C) 98.5 F (36.9 C) 97.6 F (36.4 C)  TempSrc:  Oral Oral Oral  SpO2: 95% 100% 100% 100%  Weight:      Height:        Intake/Output Summary (Last 24 hours) at 01/22/2018 1628 Last data filed at 01/22/2018 0659 Gross per 24 hour  Intake 998 ml  Output 300 ml  Net 698 ml   Filed Weights   01/13/18 0600 01/16/18 0500 01/17/18 0500  Weight: 54.8 kg 53.7 kg 55.1 kg   Weight change:   Body mass index is 21.52 kg/m.  Examination:  General exam: Appears lethargic, sick looking, elderly and frail HEENT:PERRL,Oral mucosa moist, Ear/Nose normal on gross exam Respiratory system: Bilateral equal air entry, normal vesicular breath sounds, no wheezes or crackles  Cardiovascular system: S1 & S2 heard,No JVD, murmurs.No pedal edema. Gastrointestinal system: Abdomen is  Soft, non tender, non distended, BS +  Nervous System: lethargic. moving extremities Extremities: No edema, no clubbing,distal peripheral pulses palpable. Skin: No rashes, lesions,no icterus MSK: Normal muscle bulk,tone ,power  Data Reviewed: I have personally reviewed following labs and imaging studies  CBC: Recent Labs  Lab 01/17/18 0505 01/18/18 0123 01/19/18 0236 01/20/18 0503 01/21/18 0500 01/22/18 0426  WBC 7.2 8.5 7.6 7.1 8.5 7.7  NEUTROABS 5.7  --   --   --   --   --   HGB 7.7* 8.3* 8.2* 7.8* 7.9* 7.9*  HCT 26.2* 26.9* 26.8* 26.8* 27.3* 26.6*  MCV 97.8 96.4 97.5 99.6 100.4* 101.5*  PLT 163 179 187 172 173 097   Basic Metabolic Panel: Recent Labs  Lab 01/20/18  0503 01/20/18 1533 01/20/18 2149 01/21/18 1428 01/22/18 0900  NA 155* 150* 146* 151* 148*  K 3.7 4.2 3.8 3.8 4.4  CL 112* 112* 106 109 108  CO2 32 27 30 29 31   GLUCOSE 162* 193* 283* 205* 211*  BUN 105* 108* 103* 107* 111*  CREATININE 2.33* 2.23* 2.20* 2.33* 2.41*  CALCIUM 9.2 9.1 8.9 9.0 9.0  MG 2.4  --   --   --  2.3  PHOS  --    --   --   --  4.4   GFR: Estimated Creatinine Clearance: 13.3 mL/min (A) (by C-G formula based on SCr of 2.41 mg/dL (H)). Liver Function Tests: Recent Labs  Lab 01/16/18 0503 01/18/18 0123 01/22/18 0900  AST 93* 41 24  ALT 108* 75* 32  ALKPHOS 105 87 62  BILITOT 0.9 0.9 0.6  PROT 6.6 6.1* 6.2*  ALBUMIN 3.0* 2.5* 2.1*   No results for input(s): LIPASE, AMYLASE in the last 168 hours. No results for input(s): AMMONIA in the last 168 hours. Coagulation Profile: No results for input(s): INR, PROTIME in the last 168 hours. Cardiac Enzymes: No results for input(s): CKTOTAL, CKMB, CKMBINDEX, TROPONINI in the last 168 hours. BNP (last 3 results) No results for input(s): PROBNP in the last 8760 hours. HbA1C: No results for input(s): HGBA1C in the last 72 hours. CBG: Recent Labs  Lab 01/21/18 2357 01/22/18 0347 01/22/18 0840 01/22/18 1147 01/22/18 1619  GLUCAP 131* 148* 180* 200* 186*   Lipid Profile: No results for input(s): CHOL, HDL, LDLCALC, TRIG, CHOLHDL, LDLDIRECT in the last 72 hours. Thyroid Function Tests: No results for input(s): TSH, T4TOTAL, FREET4, T3FREE, THYROIDAB in the last 72 hours. Anemia Panel: No results for input(s): VITAMINB12, FOLATE, FERRITIN, TIBC, IRON, RETICCTPCT in the last 72 hours. Sepsis Labs: No results for input(s): PROCALCITON, LATICACIDVEN in the last 168 hours.  No results found for this or any previous visit (from the past 240 hour(s)).    Radiology Studies: No results found.   Scheduled Meds: . atorvastatin  40 mg Per Tube q1800  . benztropine  0.5 mg Per Tube BID  . chlorhexidine  15 mL Mouth Rinse BID  . cholecalciferol  400 Units Per Tube Daily  . clopidogrel  75 mg Per Tube Daily  . docusate  100 mg Oral BID  . ferrous sulfate  300 mg Per Tube Daily  . free water  200 mL Per Tube Q8H  . insulin aspart  0-15 Units Subcutaneous Q4H  . insulin detemir  12 Units Subcutaneous Daily  . levalbuterol  0.63 mg Nebulization BID     And  . ipratropium  0.5 mg Nebulization BID  . mouth rinse  15 mL Mouth Rinse q12n4p  . metoprolol tartrate  25 mg Per Tube BID  . multivitamin  15 mL Per Tube Daily  . pantoprazole sodium  40 mg Per Tube Q1200  . polyethylene glycol  17 g Per Tube Daily  . QUEtiapine  12.5 mg Per Tube BID  . sennosides  5 mL Oral BID   Continuous Infusions: . sodium chloride Stopped (01/08/18 2158)  . feeding supplement (VITAL AF 1.2 CAL) 1,000 mL (01/22/18 1154)     LOS: 44 days   Time spent: More than 50% of that time was spent in counseling and/or coordination of care.  Berle Mull, MD [facility]  If 7PM-7AM, please contact night-coverage [email or web address] Password [email or web address] 01/22/2018, 4:28 PM

## 2018-01-23 ENCOUNTER — Inpatient Hospital Stay (HOSPITAL_COMMUNITY): Payer: Medicare Other

## 2018-01-23 LAB — BASIC METABOLIC PANEL
Anion gap: 14 (ref 5–15)
Anion gap: 11 (ref 5–15)
BUN: 119 mg/dL — ABNORMAL HIGH (ref 8–23)
BUN: 122 mg/dL — ABNORMAL HIGH (ref 8–23)
CO2: 29 mmol/L (ref 22–32)
CO2: 28 mmol/L (ref 22–32)
Calcium: 9 mg/dL (ref 8.9–10.3)
Calcium: 8.7 mg/dL — ABNORMAL LOW (ref 8.9–10.3)
Chloride: 106 mmol/L (ref 98–111)
Chloride: 105 mmol/L (ref 98–111)
Creatinine, Ser: 2.52 mg/dL — ABNORMAL HIGH (ref 0.44–1.00)
Creatinine, Ser: 2.52 mg/dL — ABNORMAL HIGH (ref 0.44–1.00)
GFR calc Af Amer: 19 mL/min — ABNORMAL LOW (ref >60)
GFR calc Af Amer: 19 mL/min — ABNORMAL LOW (ref >60)
GFR calc non Af Amer: 16 mL/min — ABNORMAL LOW (ref >60)
GFR calc non Af Amer: 16 mL/min — ABNORMAL LOW (ref >60)
Glucose, Bld: 183 mg/dL — ABNORMAL HIGH (ref 70–99)
Glucose, Bld: 197 mg/dL — ABNORMAL HIGH (ref 70–99)
POTASSIUM: 4.5 mmol/L (ref 3.5–5.1)
Potassium: 4.3 mmol/L (ref 3.5–5.1)
Sodium: 149 mmol/L — ABNORMAL HIGH (ref 135–145)
Sodium: 144 mmol/L (ref 135–145)

## 2018-01-23 LAB — GLUCOSE, CAPILLARY
Glucose-Capillary: 139 mg/dL — ABNORMAL HIGH (ref 70–99)
Glucose-Capillary: 155 mg/dL — ABNORMAL HIGH (ref 70–99)
Glucose-Capillary: 157 mg/dL — ABNORMAL HIGH (ref 70–99)
Glucose-Capillary: 172 mg/dL — ABNORMAL HIGH (ref 70–99)
Glucose-Capillary: 181 mg/dL — ABNORMAL HIGH (ref 70–99)
Glucose-Capillary: 231 mg/dL — ABNORMAL HIGH (ref 70–99)

## 2018-01-23 LAB — CBC
HCT: 25.2 % — ABNORMAL LOW (ref 36.0–46.0)
HEMATOCRIT: 25.7 % — AB (ref 36.0–46.0)
Hemoglobin: 7.5 g/dL — ABNORMAL LOW (ref 12.0–15.0)
Hemoglobin: 7.3 g/dL — ABNORMAL LOW (ref 12.0–15.0)
MCH: 29.5 pg (ref 26.0–34.0)
MCH: 29.7 pg (ref 26.0–34.0)
MCHC: 29.2 g/dL — ABNORMAL LOW (ref 30.0–36.0)
MCHC: 29 g/dL — ABNORMAL LOW (ref 30.0–36.0)
MCV: 101.2 fL — ABNORMAL HIGH (ref 80.0–100.0)
MCV: 102.4 fL — AB (ref 80.0–100.0)
Platelets: 155 10*3/uL (ref 150–400)
Platelets: 157 10*3/uL (ref 150–400)
RBC: 2.54 MIL/uL — ABNORMAL LOW (ref 3.87–5.11)
RBC: 2.46 MIL/uL — ABNORMAL LOW (ref 3.87–5.11)
RDW: 18.3 % — ABNORMAL HIGH (ref 11.5–15.5)
RDW: 18.2 % — ABNORMAL HIGH (ref 11.5–15.5)
WBC: 7.2 10*3/uL (ref 4.0–10.5)
WBC: 6.9 10*3/uL (ref 4.0–10.5)
nRBC: 0 % (ref 0.0–0.2)
nRBC: 0 % (ref 0.0–0.2)

## 2018-01-23 MED ORDER — BENZTROPINE MESYLATE 0.5 MG PO TABS: 0.2500 mg | ORAL_TABLET | Freq: Two times a day (BID) | ORAL | Status: DC

## 2018-01-23 MED ORDER — BISACODYL 10 MG RE SUPP
10.0000 mg | Freq: Once | RECTAL | Status: AC
  Administered 2018-01-23: 10 mg via RECTAL
  Filled 2018-01-23: qty 1

## 2018-01-23 MED ORDER — QUETIAPINE FUMARATE 25 MG PO TABS
12.5000 mg | ORAL_TABLET | Freq: Every day | ORAL | Status: DC
  Administered 2018-01-24 – 2018-01-25 (×2): 12.5 mg
  Filled 2018-01-23 (×2): qty 1

## 2018-01-23 MED ORDER — BENZTROPINE MESYLATE 0.5 MG PO TABS
0.5000 mg | ORAL_TABLET | Freq: Two times a day (BID) | ORAL | Status: DC
  Administered 2018-01-23 – 2018-02-04 (×24): 0.5 mg
  Filled 2018-01-23 (×24): qty 1

## 2018-01-23 MED ORDER — FREE WATER
100.0000 mL | Freq: Three times a day (TID) | Status: DC
  Administered 2018-01-23 – 2018-01-24 (×2): 100 mL

## 2018-01-23 NOTE — Progress Notes (Signed)
  Speech Language Pathology Treatment: Dysphagia  Patient Details Name: [name] MRN: [id number] DOB: 06-Apr-1927 Today's Date: 01/23/2018 Time: 1410-3013 SLP Time Calculation (min) (ACUTE ONLY): 28 min  Assessment / Plan / Recommendation Clinical Impression  Pt is much more alert than during previous SLP visits, and although she asks for water, she continues to have difficulty cognitively with oral intake. She does not make any attempts to suck water via straw and tries to swallow ice chips whole. Smaller amounts of water given by cup/spoon are swallowed without overt signs of aspiration, although she only consumed minimal amounts before she stopped taking in more when offered. Pt's daughter was present and she said that her brother had already told her to give small amounts of water to the pt when asking. SLP observed as she offered thin liquids to the pt and provided education/training about feeding strategies and swallowing precautions. Reviewed risk for aspiration prandially as well as post-prandially given her esophageal impairments. Daughter verbalized and demonstrated her understanding. Would maintain NPO except for minimal amounts of water offered after oral care via spoon when fully alert, upright, and accepting.    HPI HPI: 84 y.o. female with CAD s/p CABG, recent ischemic stroke, HTN, DM, HLP, recent failure to thrive due to swallowing problems s/p botox injection for achalasia, developed acute pulmonary edema and NSTEMI on 12/20/2017. Bedside swallow on 11/05/17 revealed functional oropharyngeal swallow and suspected esophageal dysphagia. Had esophagram 11/06/17 which showed moderately large hiatal hernia with poor motility and retained barium throughout the esophagus. PEG 12/10/17.  Repeat bedside swallow eval 12/23/17 with recs for NPO secondary to poor MS, no oral anticipation/recognition of POs. Pt with no change in MS that would allow safe oral intake between 11/23 and date of  intubation,12/2.  Extubated 12/6. New orders for repeat bedside swallow eval s/p extubation.      SLP Plan  Continue with current plan of care       Recommendations  Diet recommendations: NPO;Other(comment)(can offer spoonfuls of water if alert, upright, accepting) Liquids provided via: Teaspoon Medication Administration: Via alternative means                Oral Care Recommendations: Oral care QID Follow up Recommendations: Skilled Nursing facility SLP Visit Diagnosis: Dysphagia, unspecified (R13.10) Plan: Continue with current plan of care       GO                [name] 01/23/2018, 11:24 AM  [name], M.A. [place] Acute Environmental education officer [phone] Office [phone]

## 2018-01-23 NOTE — Progress Notes (Signed)
Physical Therapy Treatment/Re-eval Patient Details Name: [name] MRN: [id number] DOB: 1928/01/24 Today's Date: 01/23/2018    History of Present Illness 84 yo female admitted on 12/05/17 with onset of UTI and AMS was admitted with ongoing weight loss and dehydration with FTT.  S/p G-tube placement on 12/10/17 with ongoing confusion.  Dx with acute respiratory failure with hypoxia due to pulmonary edema, NSTEMI possibly due to volume overload 11/21, acute HF, acute metabolic encepholopathy (per neurology: 3 punctate left temproal and left frontal infarcts, etiology unclear,  but will not explain pt encephalopathy), aspiration PNA 11/23, anemia (acute on chronic), sacral pressure wound.  Respiratory distress on 11/25 with pt combative vs. lethargy.  VDRF 12/2-12/6.  On Bipap on 12/7.  PMHx:  CAD, AKI, angina, cardiomegaly, CABG, CVA with L hemi, CKD 3, transfusion, pleural effusion, DM, achalasia with botox procedure. Has G-tube    PT Comments    Goals updated and reviewed.  Pt was able to mobilize in bed and EOB with significant assistance.  She remains significantly cognitively and physically impaired.  She continues to be appropriate for SNF level rehab at discharge.  PT will continue to follow acutely for safe mobility progression  Follow Up Recommendations  SNF;Supervision/Assistance - 24 hour     Equipment Recommendations  Hospital bed;Other (comment)(hoyer lift)    Recommendations for Other Services   NA     Precautions / Restrictions Precautions Precautions: Fall Precaution Comments: coccygeal wound, Prevalon boots/ Peg 11/10 with abdominal binder, geo mat, R hand mitten    Mobility  Bed Mobility Overal bed mobility: Needs Assistance Bed Mobility: Rolling;Sidelying to Sit;Sit to Supine Rolling: Max assist Sidelying to sit: Max assist   Sit to supine: Max assist   General bed mobility comments: Max assist for mobility, pt often resisting movement, then other times not,  rolled multiple times for peri care due to BM in bed.           Balance Overall balance assessment: Needs assistance Sitting-balance support: Feet supported;No upper extremity supported;Bilateral upper extremity supported Sitting balance-Leahy Scale: Zero Sitting balance - Comments: max assist EOB, pt moves between resting her head forward on my chest (nearly folded in half at her trunk) to throwing her trunk back down in the bed towards the pillow.  Attempted to try to keep pt seated as long as possible, just over 5 mins Postural control: Posterior lean;Other (comment)(moved between posterior and anterior. )                                  Cognition Arousal/Alertness: Awake/alert Behavior During Therapy: Restless Overall Cognitive Status: Impaired/Different from baseline Area of Impairment: Orientation;Attention;Memory;Following commands;Safety/judgement;Awareness;Problem solving                 Orientation Level: Disoriented to;Person;Place;Time;Situation Current Attention Level: Focused Memory: Decreased recall of precautions;Decreased short-term memory Following Commands: Follows one step commands inconsistently Safety/Judgement: Decreased awareness of safety;Decreased awareness of deficits Awareness: Intellectual Problem Solving: Decreased initiation;Difficulty sequencing;Requires verbal cues;Requires tactile cues General Comments: Pt able to tell me her name, but that is about it, she mumbles non sensical at times, will report and localize pain.              Pertinent Vitals/Pain Pain Assessment: Faces Faces Pain Scale: Hurts even more Pain Location: when sitting reports pain in her bottom (wound) Pain Descriptors / Indicators: Grimacing;Guarding Pain Intervention(s): Limited activity within patient's tolerance;Monitored during session;Repositioned  PT Goals (current goals can now be found in the care plan section) Acute Rehab PT  Goals Patient Stated Goal: unable to state, family wants continued therapy per RN PT Goal Formulation: Patient unable to participate in goal setting Time For Goal Achievement: 02/06/18 Potential to Achieve Goals: Fair Progress towards PT goals: Not progressing toward goals - comment    Frequency    Min 2X/week      PT Plan Current plan remains appropriate       AM-PAC PT "6 Clicks" Mobility   Outcome Measure  Help needed turning from your back to your side while in a flat bed without using bedrails?: Total Help needed moving from lying on your back to sitting on the side of a flat bed without using bedrails?: Total Help needed moving to and from a bed to a chair (including a wheelchair)?: Total Help needed standing up from a chair using your arms (e.g., wheelchair or bedside chair)?: Total Help needed to walk in hospital room?: Total Help needed climbing 3-5 steps with a railing? : Total 6 Click Score: 6    End of Session Equipment Utilized During Treatment: Oxygen Activity Tolerance: Other (comment);Patient limited by pain(cognition) Patient left: in bed;with call bell/phone within reach;with bed alarm set;with nursing/sitter in room   PT Visit Diagnosis: Muscle weakness (generalized) (M62.81);Other abnormalities of gait and mobility (R26.89);Adult, failure to thrive (R62.7);Hemiplegia and hemiparesis Hemiplegia - Right/Left: Left Hemiplegia - dominant/non-dominant: Non-dominant Hemiplegia - caused by: Unspecified     Time: 8889-1694 PT Time Calculation (min) (ACUTE ONLY): 28 min  Charges:  $Therapeutic Activity: 8-22 mins                    Arleatha Philipps B. Zyra Parrillo, PT, DPT  Acute Rehabilitation [phone] pager #[phone] office   01/23/2018, 5:45 PM

## 2018-01-23 NOTE — Progress Notes (Signed)
PROGRESS NOTE    [name]  RCV:[id number] DOB: May 23, 1927 DOA: 12/05/2017 PCP: Deland Pretty, MD   Brief Narrative: Pt with PMH of diabetes, hypertension, hyperlipidemia, CAD status post CABG, history of CVA, CKD stage III who was recently diagnosed with achalasia and was being treated with Botox with no significant improvement.  She was admitted with altered mental status and was found to have difficulty speaking and finding words which then progressed to total aphasia patient has been hospitalized she had prolonged hospital course, major events listed as below with  Events: 11/5 admit 11/10 G tube placed by IR 11/13 PMT consulted 11/20 neuro consult. Rapid response for respiratory distress, treated with lasix. 11/21 cardiology consult for NSTEMI. 11/23 rapid response for respiratory distress, treated with lasix and morphine 11/27 ENT consulted for stridor, flexible laryngoscopy negative 12/1 rapid response for respiratory distress, treated with morphine 12/2 PCCM consulted for agonal respirations, transferred to ICU for intubation 12/3 Stable right atelectasis and effusion 12/6 Extubated " Currently patient is remains bedbound, lethargic, intermittently agitated.  Palliative care had seen the patient, but family wanted to pursue aggressive measures, and they are following peripherally. Patient was transfused 1 unit of PRBC for chronic anemia 12/17.  As per family request patient was placed on Seroquel to help with agitation 12/17. patient was transferred back to progressive unit 12/18.  Assessment & Plan: Acute hypoxic respiratory failure with ESBL Klebsiella aspiration pneumonia and pulmonary edema.   Overall stable needing minimal oxygen support.   Continue pulmonary support, aspiration precaution.   Keep n.p.o. strict with G tube feeding.  Aspiration pneumonia  secondary to achalasia,  patient completed antibiotic therapy.  Hypernatremia:  Progressively worsening while  the patient was undergoing treatment for CHF free water deficit of 2.3 L. Continue on tube feeding. Family/son has refused free water flushes in the past with a concern for worsening CHF needing ICU transfers. Discuss with son, with rising creatinine and BUN agreeable to use D5.   Sodium is trending down, Continue half-normal saline at 20 cc/h and free water at 200 cc every 8 hours per my discussion with family for now.  Abnormal LFTs; monitor  Acute encephalopathy due to multiple comorbidities , with failure to thrive,  with complex comorbidity with pulmonary edema CHF with low EF, anemia, UTI aspiration pneumonia NSTEMI, polypharmacy.   Patient remains confused agitated. Continue seroquel, family is concerned with Seroquel causing AKI and would like to reduce the dose from 12.5 mg twice daily to nightly. EEG unremarkable, does show triphasic waves concerning for metabolic encephalopathy. Would prefer to stop Aricept after discussion with family.  Acute on chronic combined systolic and diastolic CHF with EF 10%: Essential hypertension Stable currently. Judicious use of free water flush tube feeding due to CHF. Patient does appear to be on dry side.  Avoid fluid boluses unless necessary. Systolic blood pressure of 90 or diastolic blood pressure of 40 is okay as long as the map is more than 60. Discontinue BiDil due to hypotension  Left temporal and frontal punctuate CVA, patient is to continue on statin and Plavix as per neurology. Dopplers nondiagnostic as patient was uncooperative and TTE without evidence of vegetation/clot.  Acute kidney injury on CKD stage III  Prerenal etiology. Baseline creatinine about 11 months ago was in 2.0.Since admission has been variable from 1.1 to 1.9 and now in 2.3. Monitor closely. Very difficult situation as patient is completely dependent on tube feeding, and has very low EF, furthermore IV fluid can again trigger  CHF exacerbation.   She may have  component of cardiorenal syndrome.   Judicious use of free water per feeding.   Patient appears to be appropriate for palliative care however family wants to pursue aggressive measures. Avoid Lasix for now.  Enterococcus UTI, patient completed antibiotic.   Non-ST elevation MI, treated medically.Continue Plavix and statin, metoprolol.  Cut on the dose of metoprolol as her diastolic blood pressure was soft  Anemia of chronic diseasase.Suspect multifactorial, hemoglobin was 6.5 g 12/17 was given  1 unit PRBC 12/17 along with IV Lasix,Hb increased appropriately.  Monitor CBC.  Subcu has been discontinued as per son's request.We discussed about risk of VTE and family understands.  Moderate protein calorie moderation, continue tube feeding. Dietitian on board.  Type 2 diabetes mellitus, uncontrolled with hyper and hypoglycemia. With renal complication. Blood sugar controlled,continue Lantus and sliding scale insulin. continue lantus 12 units and monitor.  Upper palate lesion with dark lesion with small vesicle.Continue symptomatic management, local spray and monitor.  MRSA screen positive monitor.  constipation  Increase bowel regimen again.  Patient is starting to have small bowel movements today.  Will continue current bowel regimen. Anticipating significant improvement in BUN after resolution of constipation. Appears that the constipation has resolved on 01/23/2018.  We will back off on the stool softener and continue Colace.  Tardive dyskinesia Add cogentin.  Due to abdominal cramps and constipation dose was reduced but now back to prior dose of Cogentin which the patient is able to tolerate.  DVT prophylaxis: Plavix. Add scd, Heparin [place] stopped as per son' request Code Status: FULL D/w son on 01/21/2018, wants to continue full code.   Family Communication: Updated plan of care with son over the phone, and daughter at the bedside.  Overall patient is at high risk for decompensation,  readmission.  Overall prognosis appears guarded.  Disposition Plan: Continue to remain inpatient until metabolic abnormality and encephalopathy resolves.  Consultants: PCM, palliative care  Procedures: as above  Antimicrobials: completed abx.  Subjective: Awake attempting to communicate but incoherently.  No nausea no vomiting.  Appears to have abdominal cramps.  Had 2 bowel movement today.  Objective: Vitals:   01/22/18 1900 01/22/18 2318 01/23/18 0821 01/23/18 1655  BP:  (!) 110/54 (!) 116/55 (!) 150/59  Pulse:   67 73  Resp:  (!) 23  18  Temp:  (!) 97.3 F (36.3 C) 97.9 F (36.6 C) 98.2 F (36.8 C)  TempSrc:  Oral Oral Oral  SpO2: 98% 99% 100% 100%  Weight:      Height:        Intake/Output Summary (Last 24 hours) at 01/23/2018 1836 Last data filed at 01/23/2018 1800 Gross per 24 hour  Intake 2081.31 ml  Output -  Net 2081.31 ml   Filed Weights   01/13/18 0600 01/16/18 0500 01/17/18 0500  Weight: 54.8 kg 53.7 kg 55.1 kg   Weight change:   Body mass index is 21.52 kg/m.  Examination:  General exam: Appears lethargic, sick looking, elderly and frail HEENT:PERRL,Oral mucosa moist, Ear/Nose normal on gross exam Respiratory system: Bilateral equal air entry, normal vesicular breath sounds, no wheezes or crackles  Cardiovascular system: S1 & S2 heard,No JVD, murmurs.No pedal edema. Gastrointestinal system: Abdomen is  Soft, non tender, non distended, BS +  Nervous System: lethargic. moving extremities Extremities: No edema, no clubbing,distal peripheral pulses palpable. Skin: No rashes, lesions,no icterus MSK: Normal muscle bulk,tone ,power  Data Reviewed: I have personally reviewed following labs and imaging studies  CBC: Recent Labs  Lab 01/17/18 0505  01/20/18 0503 01/21/18 0500 01/22/18 0426 01/23/18 0339 01/23/18 1828  WBC 7.2   < > 7.1 8.5 7.7 7.2 6.9  NEUTROABS 5.7  --   --   --   --   --   --   HGB 7.7*   < > 7.8* 7.9* 7.9* 7.5* 7.3*  HCT  26.2*   < > 26.8* 27.3* 26.6* 25.7* 25.2*  MCV 97.8   < > 99.6 100.4* 101.5* 101.2* 102.4*  PLT 163   < > 172 173 176 155 157   < > = values in this interval not displayed.   Basic Metabolic Panel: Recent Labs  Lab 01/20/18 0503  01/20/18 2149 01/21/18 1428 01/22/18 0900 01/22/18 1700 01/23/18 0339  NA 155*   < > 146* 151* 148* 150* 149*  K 3.7   < > 3.8 3.8 4.4 4.5 4.5  CL 112*   < > 106 109 108 109 106  CO2 32   < > 30 29 31 30 29   GLUCOSE 162*   < > 283* 205* 211* 218* 183*  BUN 105*   < > 103* 107* 111* 114* 119*  CREATININE 2.33*   < > 2.20* 2.33* 2.41* 2.50* 2.52*  CALCIUM 9.2   < > 8.9 9.0 9.0 9.2 9.0  MG 2.4  --   --   --  2.3  --   --   PHOS  --   --   --   --  4.4  --   --    < > = values in this interval not displayed.   GFR: Estimated Creatinine Clearance: 12.8 mL/min (A) (by C-G formula based on SCr of 2.52 mg/dL (H)). Liver Function Tests: Recent Labs  Lab 01/18/18 0123 01/22/18 0900  AST 41 24  ALT 75* 32  ALKPHOS 87 62  BILITOT 0.9 0.6  PROT 6.1* 6.2*  ALBUMIN 2.5* 2.1*   No results for input(s): LIPASE, AMYLASE in the last 168 hours. No results for input(s): AMMONIA in the last 168 hours. Coagulation Profile: No results for input(s): INR, PROTIME in the last 168 hours. Cardiac Enzymes: No results for input(s): CKTOTAL, CKMB, CKMBINDEX, TROPONINI in the last 168 hours. BNP (last 3 results) No results for input(s): PROBNP in the last 8760 hours. HbA1C: No results for input(s): HGBA1C in the last 72 hours. CBG: Recent Labs  Lab 01/22/18 2319 01/23/18 0335 01/23/18 0818 01/23/18 1210 01/23/18 1619  GLUCAP 160* 172* 157* 231* 181*   Lipid Profile: No results for input(s): CHOL, HDL, LDLCALC, TRIG, CHOLHDL, LDLDIRECT in the last 72 hours. Thyroid Function Tests: No results for input(s): TSH, T4TOTAL, FREET4, T3FREE, THYROIDAB in the last 72 hours. Anemia Panel: No results for input(s): VITAMINB12, FOLATE, FERRITIN, TIBC, IRON, RETICCTPCT in  the last 72 hours. Sepsis Labs: No results for input(s): PROCALCITON, LATICACIDVEN in the last 168 hours.  No results found for this or any previous visit (from the past 240 hour(s)).    Radiology Studies: Dg Abd Portable 1v  Result Date: 01/23/2018 CLINICAL DATA:  84 year old female with constipation. EXAM: PORTABLE ABDOMEN - 1 VIEW COMPARISON:  01/18/2018 and earlier. FINDINGS: Portable AP supine view at 1048 hours. Decreased volume of retained stool in the large bowel upstream of the rectum, but increased and now moderate to large rectal stool ball compatible with fecal impaction (arrow). Percutaneous gastrostomy tube. Nonobstructed bowel gas pattern. Stable visualized osseous structures. Aortic and iliofemoral calcified atherosclerosis. IMPRESSION: Moderate to  large stool ball in the rectum compatible with fecal impaction, but regressed retained stool elsewhere in the large bowel since 01/18/2018. Electronically Signed   By: Genevie Ann M.D.   On: 01/23/2018 11:15     Scheduled Meds: . atorvastatin  40 mg Per Tube q1800  . benztropine  0.5 mg Per Tube BID  . chlorhexidine  15 mL Mouth Rinse BID  . cholecalciferol  400 Units Per Tube Daily  . clopidogrel  75 mg Per Tube Daily  . docusate  100 mg Oral BID  . ferrous sulfate  300 mg Per Tube Daily  . free water  200 mL Per Tube Q8H  . insulin aspart  0-15 Units Subcutaneous Q4H  . insulin detemir  12 Units Subcutaneous Daily  . levalbuterol  0.63 mg Nebulization BID   And  . ipratropium  0.5 mg Nebulization BID  . mouth rinse  15 mL Mouth Rinse q12n4p  . metoprolol tartrate  25 mg Per Tube BID  . multivitamin  15 mL Per Tube Daily  . pantoprazole sodium  40 mg Per Tube Q1200  . [START ON 01/24/2018] QUEtiapine  12.5 mg Per Tube QHS   Continuous Infusions: . sodium chloride 20 mL/hr at 01/23/18 1006  . sodium chloride Stopped (01/08/18 2158)  . feeding supplement (VITAL AF 1.2 CAL) 1,000 mL (01/23/18 1004)     LOS: 45 days    Time spent: More than 50% of that time was spent in counseling and/or coordination of care.  Berle Mull, MD [facility]  If 7PM-7AM, please contact night-coverage [email or web address] Password [facility] 01/23/2018, 6:36 PM

## 2018-01-24 DIAGNOSIS — I5022 Chronic systolic (congestive) heart failure: Secondary | ICD-10-CM

## 2018-01-24 DIAGNOSIS — G9341 Metabolic encephalopathy: Secondary | ICD-10-CM

## 2018-01-24 LAB — BASIC METABOLIC PANEL
Anion gap: 13 (ref 5–15)
BUN: 125 mg/dL — ABNORMAL HIGH (ref 8–23)
CO2: 29 mmol/L (ref 22–32)
Calcium: 8.9 mg/dL (ref 8.9–10.3)
Chloride: 104 mmol/L (ref 98–111)
Creatinine, Ser: 2.52 mg/dL — ABNORMAL HIGH (ref 0.44–1.00)
GFR calc Af Amer: 19 mL/min — ABNORMAL LOW (ref >60)
GFR calc non Af Amer: 16 mL/min — ABNORMAL LOW (ref >60)
GLUCOSE: 199 mg/dL — AB (ref 70–99)
Potassium: 4.4 mmol/L (ref 3.5–5.1)
Sodium: 146 mmol/L — ABNORMAL HIGH (ref 135–145)

## 2018-01-24 LAB — GLUCOSE, CAPILLARY
Glucose-Capillary: 164 mg/dL — ABNORMAL HIGH (ref 70–99)
Glucose-Capillary: 177 mg/dL — ABNORMAL HIGH (ref 70–99)
Glucose-Capillary: 155 mg/dL — ABNORMAL HIGH (ref 70–99)
Glucose-Capillary: 200 mg/dL — ABNORMAL HIGH (ref 70–99)
Glucose-Capillary: 187 mg/dL — ABNORMAL HIGH (ref 70–99)

## 2018-01-24 LAB — CBC
HCT: 25.6 % — ABNORMAL LOW (ref 36.0–46.0)
Hemoglobin: 7.7 g/dL — ABNORMAL LOW (ref 12.0–15.0)
MCH: 30.2 pg (ref 26.0–34.0)
MCHC: 30.1 g/dL (ref 30.0–36.0)
MCV: 100.4 fL — AB (ref 80.0–100.0)
Platelets: 164 10*3/uL (ref 150–400)
RBC: 2.55 MIL/uL — ABNORMAL LOW (ref 3.87–5.11)
RDW: 18.2 % — ABNORMAL HIGH (ref 11.5–15.5)
WBC: 7.9 10*3/uL (ref 4.0–10.5)
nRBC: 0 % (ref 0.0–0.2)

## 2018-01-24 MED ORDER — FREE WATER
200.0000 mL | Freq: Three times a day (TID) | Status: DC
  Administered 2018-01-24 – 2018-01-27 (×8): 200 mL

## 2018-01-24 NOTE — Progress Notes (Signed)
PROGRESS NOTE    [name]  ZDG:[id number] DOB: Mar 20, 1927 DOA: 12/05/2017 PCP: Deland Pretty, MD   Brief Narrative: Pt with PMH of diabetes, hypertension, hyperlipidemia, CAD status post CABG, history of CVA, CKD stage III who was recently diagnosed with achalasia and was being treated with Botox with no significant improvement.  She was admitted with altered mental status and was found to have difficulty speaking and finding words which then progressed to total aphasia patient has been hospitalized she had prolonged hospital course, major events listed as below with  Events: 11/5 admit 11/10 G tube placed by IR 11/13 PMT consulted 11/20 neuro consult. Rapid response for respiratory distress, treated with lasix. 11/21 cardiology consult for NSTEMI. 11/23 rapid response for respiratory distress, treated with lasix and morphine 11/27 ENT consulted for stridor, flexible laryngoscopy negative 12/1 rapid response for respiratory distress, treated with morphine 12/2 PCCM consulted for agonal respirations, transferred to ICU for intubation 12/3 Stable right atelectasis and effusion 12/6 Extubated " Currently patient is remains bedbound, lethargic, intermittently agitated.  Palliative care had seen the patient, but family wanted to pursue aggressive measures, and they are following peripherally. Patient was transfused 1 unit of PRBC for chronic anemia 12/17.  As per family request patient was placed on Seroquel to help with agitation 12/17. patient was transferred back to progressive unit 12/18.  Assessment & Plan:  Acute hypoxic respiratory failure with ESBL Klebsiella aspiration pneumonia and pulmonary edema.   Overall stable needing minimal oxygen support.   Continue pulmonary support, aspiration precaution.   N.p.o.  Aspiration pneumonia  Secondary to achalasia, Patient completed antibiotic therapy.  Hypernatremia:  Progressively worsening while the patient was undergoing  treatment for CHF.  Was noted to have free water deficit.  Patient was given free water through PEG tube.  Given IV fluids with improvement in sodium levels.  Sodium is noted to be 146 this morning.  Acute kidney injury on CKD stage III  Baseline creatinine about 11 months ago was in 2.0. Since admission has been variable from 1.1 to 1.9 and now in 2.3.  Has been 2.5 for the last few days.  However BUN has been rising.  Likely due to free water deficits.  Discussed with patient's son.  Will increase the free water through PEG tube to 200 mL every 8 hours.  We will increase half-normal saline to 20 mL/h.  Recheck labs tomorrow.  Is a difficult situation as the patient has low ejection fraction and has gone into pulmonary edema few times during this hospital course. She may have component of cardiorenal syndrome.   Patient appears to be appropriate for palliative care however family wants to pursue aggressive measures. Avoid Lasix for now.  Abnormal LFTs LFTs were normal when last checked on 12/23.  Acute encephalopathy due to multiple comorbidities with failure to thrive,  with complex comorbidity with pulmonary edema CHF with low EF, anemia, UTI aspiration pneumonia NSTEMI, polypharmacy.   Patient has been confused and agitated.  Mentation has improved in the last few days.  Patient remains on low-dose of Seroquel.  EEG was unremarkable.  Donepezil was discontinued.    Acute on chronic combined systolic and diastolic CHF with EF 59%: Essential hypertension Stable currently. Judicious use of free water flush tube feeding due to CHF. Patient does appear to be volume depleted.  Free water dose increase.  IV fluid rate increase slightly.  Monitor respiratory status closely.  BiDil was discontinued due to hypotension.  No ACE inhibitor or ARB  due to renal failure.  Patient is on beta-blockers.  Blood pressure is stable.  Left temporal and frontal punctuate CVA Patient to continue statin and Plavix  as per neurology.  Dopplers nondiagnostic as patient was uncooperative and TTE without evidence of vegetation/clot.  Enterococcus UTI Patient has completed course of antibiotics.     Non-ST elevation MI Treated medically. Continue Plavix and statin, metoprolol.   Anemia of chronic diseasase Patient given 1 unit of PRBC on 12/17.  Hemoglobin has been low but stable.  Patient's prophylactic anticoagulation has been discontinued as per son's request.he was notified about the risk of VTE.    Moderate protein calorie moderation Continue tube feeding. Dietitian on board.  Type 2 diabetes mellitus, uncontrolled with hyper and hypoglycemia. With renal complication. Blood sugar controlled,continue Lantus and sliding scale insulin.  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 Continue symptomatic management, local spray and monitor.  Constipation  X-ray from yesterday suggested fecal impaction.  Patient was given suppository with good response.  She has had multiple bowel movements over the last 24 hours.  Continue to monitor.    Tardive dyskinesia Improved with Cogentin.    DVT prophylaxis: Plavix. Add scd, Heparin [place] stopped as per son' request Code Status: FULL Family Communication: Discussed with patient's son and daughter.   Disposition Plan: Waiting on improvement in renal function and encephalopathy.    Consultants: PCM, palliative care  Procedures: as above  Antimicrobials: completed abx.  Subjective: Patient remains encephalopathic.  Does acknowledge me but does not answer any questions.  Moving all her extremities.  Objective: Vitals:   01/23/18 1947 01/24/18 0000 01/24/18 0753 01/24/18 0914  BP:  (!) 132/49 136/71   Pulse:  67 72 75  Resp:  16 10 12   Temp:  97.9 F (36.6 C) 98.1 F (36.7 C)   TempSrc:  Axillary    SpO2: 99% 100% 100% 96%  Weight:      Height:        Intake/Output Summary (Last 24 hours) at 01/24/2018 1228 Last data filed at  01/24/2018 0659 Gross per 24 hour  Intake 1693.31 ml  Output -  Net 1693.31 ml   Filed Weights   01/13/18 0600 01/16/18 0500 01/17/18 0500  Weight: 54.8 kg 53.7 kg 55.1 kg   Weight change:   Body mass index is 21.52 kg/m.  Examination:  Awake alert.  Somewhat agitated at times.  No distress Normal effort at rest.  Diminished air entry at the bases.  Clear to auscultation otherwise.  No wheezing or rhonchi. Abdomen is soft.  Nontender nondistended.  PEG tube is noted. Minimal edema bilateral lower extremities Moving all her extremities.  No obvious focal neurological deficits at this time.  Data Reviewed: I have personally reviewed following labs and imaging studies  CBC: Recent Labs  Lab 01/21/18 0500 01/22/18 0426 01/23/18 0339 01/23/18 1828 01/24/18 0440  WBC 8.5 7.7 7.2 6.9 7.9  HGB 7.9* 7.9* 7.5* 7.3* 7.7*  HCT 27.3* 26.6* 25.7* 25.2* 25.6*  MCV 100.4* 101.5* 101.2* 102.4* 100.4*  PLT 173 176 155 157 329   Basic Metabolic Panel: Recent Labs  Lab 01/20/18 0503  01/22/18 0900 01/22/18 1700 01/23/18 0339 01/23/18 1828 01/24/18 0440  NA 155*   < > 148* 150* 149* 144 146*  K 3.7   < > 4.4 4.5 4.5 4.3 4.4  CL 112*   < > 108 109 106 105 104  CO2 32   < > 31 30 29 28  29  GLUCOSE 162*   < > 211* 218* 183* 197* 199*  BUN 105*   < > 111* 114* 119* 122* 125*  CREATININE 2.33*   < > 2.41* 2.50* 2.52* 2.52* 2.52*  CALCIUM 9.2   < > 9.0 9.2 9.0 8.7* 8.9  MG 2.4  --  2.3  --   --   --   --   PHOS  --   --  4.4  --   --   --   --    < > = values in this interval not displayed.   GFR: Estimated Creatinine Clearance: 12.8 mL/min (A) (by C-G formula based on SCr of 2.52 mg/dL (H)). Liver Function Tests: Recent Labs  Lab 01/18/18 0123 01/22/18 0900  AST 41 24  ALT 75* 32  ALKPHOS 87 62  BILITOT 0.9 0.6  PROT 6.1* 6.2*  ALBUMIN 2.5* 2.1*   CBG: Recent Labs  Lab 01/23/18 2027 01/23/18 2350 01/24/18 0352 01/24/18 0754 01/24/18 1144  GLUCAP 139* 155* 164*  177* 155*     Radiology Studies: Dg Abd Portable 1v  Result Date: 01/23/2018 CLINICAL DATA:  84 year old female with constipation. EXAM: PORTABLE ABDOMEN - 1 VIEW COMPARISON:  01/18/2018 and earlier. FINDINGS: Portable AP supine view at 1048 hours. Decreased volume of retained stool in the large bowel upstream of the rectum, but increased and now moderate to large rectal stool ball compatible with fecal impaction (arrow). Percutaneous gastrostomy tube. Nonobstructed bowel gas pattern. Stable visualized osseous structures. Aortic and iliofemoral calcified atherosclerosis. IMPRESSION: Moderate to large stool ball in the rectum compatible with fecal impaction, but regressed retained stool elsewhere in the large bowel since 01/18/2018. Electronically Signed   By: Genevie Ann M.D.   On: 01/23/2018 11:15     Scheduled Meds: . atorvastatin  40 mg Per Tube q1800  . benztropine  0.5 mg Per Tube BID  . chlorhexidine  15 mL Mouth Rinse BID  . cholecalciferol  400 Units Per Tube Daily  . clopidogrel  75 mg Per Tube Daily  . docusate  100 mg Oral BID  . ferrous sulfate  300 mg Per Tube Daily  . free water  200 mL Per Tube Q8H  . insulin aspart  0-15 Units Subcutaneous Q4H  . insulin detemir  12 Units Subcutaneous Daily  . levalbuterol  0.63 mg Nebulization BID   And  . ipratropium  0.5 mg Nebulization BID  . mouth rinse  15 mL Mouth Rinse q12n4p  . metoprolol tartrate  25 mg Per Tube BID  . multivitamin  15 mL Per Tube Daily  . pantoprazole sodium  40 mg Per Tube Q1200  . QUEtiapine  12.5 mg Per Tube QHS   Continuous Infusions: . sodium chloride 20 mL/hr at 01/24/18 1026  . sodium chloride Stopped (01/08/18 2158)  . feeding supplement (VITAL AF 1.2 CAL) 1,000 mL (01/24/18 0910)     LOS: 81 days     Bonnielee Haff, MD [facility]  If 7PM-7AM, please contact night-coverage [email or web address] Password [facility] 01/24/2018, 12:28 PM

## 2018-01-25 LAB — BASIC METABOLIC PANEL
Anion gap: 13 (ref 5–15)
BUN: 123 mg/dL — ABNORMAL HIGH (ref 8–23)
CALCIUM: 9.1 mg/dL (ref 8.9–10.3)
CO2: 29 mmol/L (ref 22–32)
Chloride: 104 mmol/L (ref 98–111)
Creatinine, Ser: 2.59 mg/dL — ABNORMAL HIGH (ref 0.44–1.00)
GFR calc Af Amer: 18 mL/min — ABNORMAL LOW (ref >60)
GFR, EST NON AFRICAN AMERICAN: 16 mL/min — AB (ref >60)
Glucose, Bld: 122 mg/dL — ABNORMAL HIGH (ref 70–99)
Potassium: 4.4 mmol/L (ref 3.5–5.1)
SODIUM: 146 mmol/L — AB (ref 135–145)

## 2018-01-25 LAB — GLUCOSE, CAPILLARY
Glucose-Capillary: 101 mg/dL — ABNORMAL HIGH (ref 70–99)
Glucose-Capillary: 113 mg/dL — ABNORMAL HIGH (ref 70–99)
Glucose-Capillary: 114 mg/dL — ABNORMAL HIGH (ref 70–99)
Glucose-Capillary: 165 mg/dL — ABNORMAL HIGH (ref 70–99)
Glucose-Capillary: 175 mg/dL — ABNORMAL HIGH (ref 70–99)
Glucose-Capillary: 188 mg/dL — ABNORMAL HIGH (ref 70–99)
Glucose-Capillary: 201 mg/dL — ABNORMAL HIGH (ref 70–99)

## 2018-01-25 MED ORDER — LEVALBUTEROL HCL 0.63 MG/3ML IN NEBU
0.6300 mg | INHALATION_SOLUTION | Freq: Two times a day (BID) | RESPIRATORY_TRACT | Status: DC
  Administered 2018-01-25: 0.63 mg via RESPIRATORY_TRACT

## 2018-01-25 MED ORDER — DOCUSATE SODIUM 50 MG/5ML PO LIQD
100.0000 mg | Freq: Every day | ORAL | Status: AC
  Administered 2018-01-26 – 2018-02-14 (×20): 100 mg
  Filled 2018-01-25 (×21): qty 10

## 2018-01-25 MED ORDER — FUROSEMIDE 10 MG/ML IJ SOLN
40.0000 mg | Freq: Once | INTRAMUSCULAR | Status: AC
  Administered 2018-01-25: 40 mg via INTRAVENOUS
  Filled 2018-01-25: qty 4

## 2018-01-25 MED ORDER — LEVALBUTEROL HCL 0.63 MG/3ML IN NEBU: 0.6300 mg | INHALATION_SOLUTION | Freq: Two times a day (BID) | RESPIRATORY_TRACT | Status: DC

## 2018-01-25 MED ORDER — ALBUMIN HUMAN 25 % IV SOLN
25.0000 g | Freq: Once | INTRAVENOUS | Status: AC
  Administered 2018-01-25: 25 g via INTRAVENOUS
  Filled 2018-01-25: qty 50

## 2018-01-25 MED ORDER — DOCUSATE SODIUM 50 MG/5ML PO LIQD: 50.0000 mg | Freq: Two times a day (BID) | ORAL | Status: DC

## 2018-01-25 MED ORDER — LEVALBUTEROL HCL 0.63 MG/3ML IN NEBU
0.6300 mg | INHALATION_SOLUTION | Freq: Two times a day (BID) | RESPIRATORY_TRACT | Status: DC
  Administered 2018-01-25 – 2018-01-28 (×6): 0.63 mg via RESPIRATORY_TRACT
  Filled 2018-01-25 (×8): qty 3

## 2018-01-25 MED ORDER — IPRATROPIUM BROMIDE 0.02 % IN SOLN
0.5000 mg | Freq: Two times a day (BID) | RESPIRATORY_TRACT | Status: DC
  Administered 2018-01-25: 0.5 mg via RESPIRATORY_TRACT

## 2018-01-25 MED ORDER — IPRATROPIUM BROMIDE 0.02 % IN SOLN: 0.5000 mg | Freq: Two times a day (BID) | RESPIRATORY_TRACT | Status: DC

## 2018-01-25 MED ORDER — IPRATROPIUM BROMIDE 0.02 % IN SOLN
0.5000 mg | Freq: Two times a day (BID) | RESPIRATORY_TRACT | Status: DC
  Administered 2018-01-25 – 2018-01-28 (×6): 0.5 mg via RESPIRATORY_TRACT
  Filled 2018-01-25 (×8): qty 2.5

## 2018-01-25 NOTE — Progress Notes (Signed)
Occupational Therapy Treatment Patient Details Name: [name] MRN: [id number] DOB: 1927/06/11 Today's Date: 01/25/2018    History of present illness 84 yo female admitted on 12/05/17 with onset of UTI and AMS was admitted with ongoing weight loss and dehydration with FTT.  S/p G-tube placement on 12/10/17 with ongoing confusion.  Dx with acute respiratory failure with hypoxia due to pulmonary edema, NSTEMI possibly due to volume overload 11/21, acute HF, acute metabolic encepholopathy (per neurology: 3 punctate left temproal and left frontal infarcts, etiology unclear,  but will not explain pt encephalopathy), aspiration PNA 11/23, anemia (acute on chronic), sacral pressure wound.  Respiratory distress on 11/25 with pt combative vs. lethargy.  VDRF 12/2-12/6.  On Bipap on 12/7.  PMHx:  CAD, AKI, angina, cardiomegaly, CABG, CVA with L hemi, CKD 3, transfusion, pleural effusion, DM, achalasia with botox procedure. Has G-tube   OT comments  Pt moved to EOB sitting with max A.  She followed one step commands inconsistently (~25% of time).  She requires max A for simple grooming, but total A for remainder of ADLs.  She was incontinent of stool, and assisted with peri care.  Recommend SNF level rehab.   Follow Up Recommendations  SNF    Equipment Recommendations  None recommended by OT    Recommendations for Other Services Other (comment)    Precautions / Restrictions Precautions Precautions: Fall Precaution Comments: coccygeal wound, Prevalon boots/ Peg 11/10 with abdominal binder, geo mat, R hand mitten       Mobility Bed Mobility Overal bed mobility: Needs Assistance Bed Mobility: Supine to Sit;Sit to Supine;Rolling Rolling: Max assist;Total assist   Supine to sit: Max assist Sit to supine: Max assist   General bed mobility comments: pt able to assist minimally.  she leaned toward head of bed and was able to lower trunk to bed with min A, then assist to lift LEs   Transfers                       Balance Overall balance assessment: Needs assistance Sitting-balance support: Feet supported;No upper extremity supported;Bilateral upper extremity supported Sitting balance-Leahy Scale: Zero Sitting balance - Comments: pt very restless, required max A for EOB sitting.  She would randomly throw herself posteriorly, to the Lt/Rt and anteriorly                                    ADL either performed or assessed with clinical judgement   ADL Overall ADL's : Needs assistance/impaired     Grooming: Maximal assistance;Wash/dry face;Wash/dry hands;Bed level                       Toileting- Clothing Manipulation and Hygiene: Total assistance;+2 for physical assistance;Bed level Toileting - Clothing Manipulation Details (indicate cue type and reason): Pt incontinent of stool, required assist for peri care              Vision       Perception     Praxis      Cognition Arousal/Alertness: Awake/alert Behavior During Therapy: Restless Overall Cognitive Status: Impaired/Different from baseline Area of Impairment: Orientation;Attention;Memory;Following commands;Safety/judgement;Awareness;Problem solving                 Orientation Level: Disoriented to;Place;Time;Situation Current Attention Level: Focused Memory: Decreased short-term memory Following Commands: Follows one step commands inconsistently;Follows one step commands with increased time Safety/Judgement: Decreased  awareness of deficits;Decreased awareness of safety   Problem Solving: Slow processing;Decreased initiation;Requires verbal cues;Difficulty sequencing;Requires tactile cues General Comments: Pt will follow occasional one step commands.  When provided with choices, she is able to indicate her name         Exercises     Shoulder Instructions       General Comments Pt incontinent of stool.  Assisted RN with peri care     Pertinent Vitals/ Pain        Pain Assessment: Faces Faces Pain Scale: Hurts even more Pain Location: unable to determine.  Pt will scream out randomly, then will smile when spoken to  Pain Descriptors / Indicators: Restless;Moaning Pain Intervention(s): Repositioned;Monitored during session;Limited activity within patient's tolerance  Home Living                                          Prior Functioning/Environment              Frequency  Min 2X/week        Progress Toward Goals  OT Goals(current goals can now be found in the care plan section)  Progress towards OT goals: Progressing toward goals;Goals drowngraded-see care plan  Acute Rehab OT Goals Patient Stated Goal: unable to state, family wants continued therapy per RN OT Goal Formulation: Patient unable to participate in goal setting Time For Goal Achievement: 02/08/18 Potential to Achieve Goals: [place] Discharge plan remains appropriate    Co-evaluation                 AM-PAC OT "6 Clicks" Daily Activity     Outcome Measure   Help from another person eating meals?: Total Help from another person taking care of personal grooming?: Total Help from another person toileting, which includes using toliet, bedpan, or urinal?: Total Help from another person bathing (including washing, rinsing, drying)?: Total Help from another person to put on and taking off regular upper body clothing?: Total Help from another person to put on and taking off regular lower body clothing?: Total 6 Click Score: 6    End of Session    OT Visit Diagnosis: Other abnormalities of gait and mobility (R26.89);Muscle weakness (generalized) (M62.81);Other symptoms and signs involving cognitive function   Activity Tolerance     Patient Left     Nurse Communication          Time: 1220-1300 OT Time Calculation (min): 40 min  Charges: OT General Charges $OT Visit: 1 Visit OT Treatments $Self Care/Home Management : 8-22  mins $Therapeutic Activity: 23-37 mins  Lucille Passy, OTR/L Acute Rehabilitation Services Pager [phone] Office [phone]     Lucille Passy M 01/25/2018, 1:33 PM

## 2018-01-25 NOTE — Progress Notes (Signed)
PROGRESS NOTE    [name]  QVZ:[id number] DOB: 1927/05/18 DOA: 12/05/2017 PCP: Deland Pretty, MD   Brief Narrative: Pt with PMH of diabetes, hypertension, hyperlipidemia, CAD status post CABG, history of CVA, CKD stage III who was recently diagnosed with achalasia and was being treated with Botox with no significant improvement.  She was admitted with altered mental status and was found to have difficulty speaking and finding words which then progressed to total aphasia patient has been hospitalized she had prolonged hospital course.  Events: 11/5 admit 11/10 G tube placed by IR 11/13 PMT consulted 11/20 neuro consult. Rapid response for respiratory distress, treated with lasix. 11/21 cardiology consult for NSTEMI. 11/23 rapid response for respiratory distress, treated with lasix and morphine 11/27 ENT consulted for stridor, flexible laryngoscopy negative 12/1 rapid response for respiratory distress, treated with morphine 12/2 PCCM consulted for agonal respirations, transferred to ICU for intubation 12/3 Stable right atelectasis and effusion 12/6 Extubated " Currently patient is remains bedbound, lethargic, intermittently agitated.  Palliative care had seen the patient, but family wanted to pursue aggressive measures, and they are following peripherally. Patient was transfused 1 unit of PRBC for chronic anemia 12/17.  As per family request patient was placed on Seroquel to help with agitation 12/17. patient was transferred back to progressive unit 12/18.   Assessment & Plan:  Acute kidney injury on CKD stage III  Baseline creatinine about 11 months ago was in 2.0. Since admission has been variable from 1.1 to 1.9 and now in 2.3.  Has been 2.5 for the last few days.  However BUN has been rising.  Likely due to prerenal reasons.  Could be due to free water deficit as although the patient has been getting water through the PEG tube.  Free water was increased.  IV fluids increased to 20  mL/h.  Patient's weight however has increased significantly over the last week.  She is appears to have gained about 5 to 6 kg.  Discussed with patient's son.  We will give the patient 1 dose of IV Lasix along with albumin.  See if there is any response to this.  If not we may have to consult nephrology to assist with management. She may have component of cardiorenal syndrome.  Patient appears to be appropriate for palliative care however family wants to pursue aggressive measures.   Acute on chronic combined systolic and diastolic CHF with EF 32%: Essential hypertension Stable currently. Judicious use of free water flush tube feeding due to CHF. As noted above patient appears to have gained about 5 to 6 kg over the last 1 week.  She was 53 kg last week and today is noted to be 60 to 60 mcg.  She does have some edema in the upper and lower extremities.  We will give her 1 dose of Lasix.  Monitor urine output.   BiDil was discontinued due to hypotension.  No ACE inhibitor or ARB due to renal failure.  Patient is on beta-blockers.  Blood pressure is stable.  Hypernatremia Progressively worsening while the patient was undergoing treatment for CHF.  Was noted to have free water deficit.  Patient was given free water through PEG tube.  Given IV fluids with improvement in sodium levels.  Sodium level remains stable  Acute encephalopathy due to multiple comorbidities With complex comorbidity with pulmonary edema CHF with low EF, anemia, UTI aspiration pneumonia NSTEMI, polypharmacy.   Patient has been confused and agitated.  Mental status appears to be stable for  the most part.  Continue low-dose of Seroquel.  EEG was unremarkable.  Donepezil was discontinued.   Uremia is likely contributing as well.  Acute hypoxic respiratory failure with ESBL Klebsiella aspiration pneumonia and pulmonary edema.   Remains stable from a respiratory standpoint.  Continue with aspiration precautions.  Currently on tube  feedings.  She is n.p.o.  Aspiration pneumonia  Secondary to achalasia. Patient completed antibiotic therapy.  Abnormal LFTs LFTs were normal when last checked on 12/23.  Left temporal and frontal punctuate CVA Patient to continue statin and Plavix as per neurology.  Dopplers nondiagnostic as patient was uncooperative and TTE without evidence of vegetation/clot.  Enterococcus UTI Patient has completed course of antibiotics.     Non-ST elevation MI Treated medically. Continue Plavix and statin, metoprolol.   Anemia of chronic diseasase Patient given 1 unit of PRBC on 12/17.  Hemoglobin has been low but stable.  Patient's prophylactic anticoagulation has been discontinued as per son's request. He was notified about the risk of VTE.    Moderate protein calorie moderation/oropharyngeal dysphagia Continue tube feeding. Dietitian on board.  Type 2 diabetes mellitus, uncontrolled with hyper and hypoglycemia. With renal complication. CBGs are reasonably well controlled.  Continue to monitor.  Continue Lantus and SSI.  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 Continue symptomatic management, local spray and monitor.  Constipation/Fecal impaction This has been relieved with the suppositories.  Cut back on stool softeners as the patient is having loose stools now.    Tardive dyskinesia Improved with Cogentin.    DVT prophylaxis: Plavix. Add scd, Heparin [place] stopped as per son' request Code Status: FULL Family Communication: Discussed with patient's son and daughter.   Disposition Plan: Waiting on improvement in renal function and encephalopathy.    Consultants: PCM, palliative care  Antimicrobials: completed abx.  Subjective: Patient remains confused.  Looks at me but does not answer any questions.  Her daughter is at the bedside.    Objective: Vitals:   01/25/18 0044 01/25/18 0500 01/25/18 0726 01/25/18 1106  BP: 130/62  137/63   Pulse: 77  79 75  Resp: 20   13 18   Temp: 98.3 F (36.8 C)  98 F (36.7 C)   TempSrc: Oral  Oral   SpO2: 99%  100% 99%  Weight:  60 kg    Height:        Intake/Output Summary (Last 24 hours) at 01/25/2018 1245 Last data filed at 01/25/2018 0610 Gross per 24 hour  Intake 2199.92 ml  Output 0 ml  Net 2199.92 ml   Filed Weights   01/16/18 0500 01/17/18 0500 01/25/18 0500  Weight: 53.7 kg 55.1 kg 60 kg   Weight change:   Body mass index is 23.43 kg/m.  Examination:  Awake alert.  Agitated at times.  In no distress Normal effort at rest.  Diminished air entry at the bases.  Clear to auscultation. Abdomen is soft.  Nontender nondistended.  PEG tube is noted. edema bilateral lower and upper extremities Moving all extremities.  No focal neurological deficits.  Data Reviewed: I have personally reviewed following labs and imaging studies  CBC: Recent Labs  Lab 01/21/18 0500 01/22/18 0426 01/23/18 0339 01/23/18 1828 01/24/18 0440  WBC 8.5 7.7 7.2 6.9 7.9  HGB 7.9* 7.9* 7.5* 7.3* 7.7*  HCT 27.3* 26.6* 25.7* 25.2* 25.6*  MCV 100.4* 101.5* 101.2* 102.4* 100.4*  PLT 173 176 155 157 696   Basic Metabolic Panel: Recent Labs  Lab 01/20/18 0503  01/22/18  0900 01/22/18 1700 01/23/18 0339 01/23/18 1828 01/24/18 0440 01/25/18 0700  NA 155*   < > 148* 150* 149* 144 146* 146*  K 3.7   < > 4.4 4.5 4.5 4.3 4.4 4.4  CL 112*   < > 108 109 106 105 104 104  CO2 32   < > 31 30 29 28 29 29   GLUCOSE 162*   < > 211* 218* 183* 197* 199* 122*  BUN 105*   < > 111* 114* 119* 122* 125* 123*  CREATININE 2.33*   < > 2.41* 2.50* 2.52* 2.52* 2.52* 2.59*  CALCIUM 9.2   < > 9.0 9.2 9.0 8.7* 8.9 9.1  MG 2.4  --  2.3  --   --   --   --   --   PHOS  --   --  4.4  --   --   --   --   --    < > = values in this interval not displayed.   GFR: Estimated Creatinine Clearance: 12.4 mL/min (A) (by C-G formula based on SCr of 2.59 mg/dL (H)). Liver Function Tests: Recent Labs  Lab 01/22/18 0900  AST 24  ALT 32  ALKPHOS 62   BILITOT 0.6  PROT 6.2*  ALBUMIN 2.1*   CBG: Recent Labs  Lab 01/24/18 1944 01/25/18 0039 01/25/18 0336 01/25/18 0727 01/25/18 1214  GLUCAP 187* 113* 101* 114* 201*     Radiology Studies: No results found.   Scheduled Meds: . atorvastatin  40 mg Per Tube q1800  . benztropine  0.5 mg Per Tube BID  . chlorhexidine  15 mL Mouth Rinse BID  . cholecalciferol  400 Units Per Tube Daily  . clopidogrel  75 mg Per Tube Daily  . [START ON 01/26/2018] docusate  100 mg Per Tube Daily  . ferrous sulfate  300 mg Per Tube Daily  . free water  200 mL Per Tube Q8H  . insulin aspart  0-15 Units Subcutaneous Q4H  . insulin detemir  12 Units Subcutaneous Daily  . ipratropium  0.5 mg Nebulization BID   And  . levalbuterol  0.63 mg Nebulization BID  . mouth rinse  15 mL Mouth Rinse q12n4p  . metoprolol tartrate  25 mg Per Tube BID  . multivitamin  15 mL Per Tube Daily  . pantoprazole sodium  40 mg Per Tube Q1200  . QUEtiapine  12.5 mg Per Tube QHS   Continuous Infusions: . sodium chloride 20 mL/hr at 01/25/18 0824  . sodium chloride Stopped (01/08/18 2158)  . feeding supplement (VITAL AF 1.2 CAL) 55 mL/hr at 01/24/18 1806     LOS: 33 days     Bonnielee Haff, MD [facility]  If 7PM-7AM, please contact night-coverage [email or web address] Password [facility] 01/25/2018, 12:45 PM

## 2018-01-25 NOTE — Progress Notes (Signed)
Discussed urine output with MD. Despite failed purewick catheter (patient tampering) urine output from two incontinent episodes remains marginal. Estimated UO after lasix from 2ng incontinent episode 50cc. Purewick back in place with extra securement. Multiple BMs today, becoming more firm overall but still loose from impaction. Manual disimpaction performed.   Patient rested well for ~4 hours this afternoon after work with OT. No change in neuro status-can reply to statements/questions but cannot answer questions.   Will continue to monitor. Kinnie Scales, RN 01/25/18 6:00 PM

## 2018-01-26 ENCOUNTER — Inpatient Hospital Stay (HOSPITAL_COMMUNITY): Payer: Medicare Other

## 2018-01-26 LAB — CBC
HCT: 25.7 % — ABNORMAL LOW (ref 36.0–46.0)
Hemoglobin: 7.4 g/dL — ABNORMAL LOW (ref 12.0–15.0)
MCH: 28.9 pg (ref 26.0–34.0)
MCHC: 28.8 g/dL — ABNORMAL LOW (ref 30.0–36.0)
MCV: 100.4 fL — ABNORMAL HIGH (ref 80.0–100.0)
Platelets: 172 10*3/uL (ref 150–400)
RBC: 2.56 MIL/uL — ABNORMAL LOW (ref 3.87–5.11)
RDW: 17.7 % — ABNORMAL HIGH (ref 11.5–15.5)
WBC: 6.5 10*3/uL (ref 4.0–10.5)
nRBC: 0 % (ref 0.0–0.2)

## 2018-01-26 LAB — BASIC METABOLIC PANEL
Anion gap: 13 (ref 5–15)
BUN: 134 mg/dL — ABNORMAL HIGH (ref 8–23)
CO2: 28 mmol/L (ref 22–32)
Calcium: 9 mg/dL (ref 8.9–10.3)
Chloride: 102 mmol/L (ref 98–111)
Creatinine, Ser: 2.7 mg/dL — ABNORMAL HIGH (ref 0.44–1.00)
GFR calc non Af Amer: 15 mL/min — ABNORMAL LOW (ref >60)
GFR, EST AFRICAN AMERICAN: 18 mL/min — AB (ref >60)
Glucose, Bld: 179 mg/dL — ABNORMAL HIGH (ref 70–99)
Potassium: 4.7 mmol/L (ref 3.5–5.1)
Sodium: 143 mmol/L (ref 135–145)

## 2018-01-26 LAB — GLUCOSE, CAPILLARY
Glucose-Capillary: 174 mg/dL — ABNORMAL HIGH (ref 70–99)
Glucose-Capillary: 172 mg/dL — ABNORMAL HIGH (ref 70–99)
Glucose-Capillary: 210 mg/dL — ABNORMAL HIGH (ref 70–99)
Glucose-Capillary: 125 mg/dL — ABNORMAL HIGH (ref 70–99)
Glucose-Capillary: 144 mg/dL — ABNORMAL HIGH (ref 70–99)

## 2018-01-26 MED ORDER — SODIUM CHLORIDE 0.9 % IV BOLUS
250.0000 mL | Freq: Once | INTRAVENOUS | Status: AC
  Administered 2018-01-26: 250 mL via INTRAVENOUS

## 2018-01-26 MED ORDER — COLLAGENASE 250 UNIT/GM EX OINT
TOPICAL_OINTMENT | Freq: Every day | CUTANEOUS | Status: DC
  Administered 2018-01-26 – 2018-01-28 (×3): via TOPICAL
  Administered 2018-01-29: 1 via TOPICAL
  Administered 2018-01-30: 12:00:00 via TOPICAL
  Administered 2018-01-31 – 2018-02-01 (×2): 1 via TOPICAL
  Administered 2018-02-03 – 2018-02-07 (×5): via TOPICAL
  Administered 2018-02-08: 1 via TOPICAL
  Administered 2018-02-09: 09:00:00 via TOPICAL
  Administered 2018-02-10: 1 via TOPICAL
  Administered 2018-02-11 – 2018-02-15 (×5): via TOPICAL
  Administered 2018-02-16: 1 via TOPICAL
  Administered 2018-02-17 – 2018-02-20 (×4): via TOPICAL
  Administered 2018-02-21 – 2018-02-22 (×2): 1 via TOPICAL
  Administered 2018-02-23 – 2018-02-25 (×3): via TOPICAL
  Administered 2018-02-26: 1 via TOPICAL
  Filled 2018-01-26: qty 30

## 2018-01-26 NOTE — Progress Notes (Signed)
PROGRESS NOTE    [name]  DGU:[id number] DOB: 1927-04-16 DOA: 12/05/2017 PCP: Deland Pretty, MD   Brief Narrative: Pt with PMH of diabetes, hypertension, hyperlipidemia, CAD status post CABG, history of CVA, CKD stage III who was recently diagnosed with achalasia and was being treated with Botox with no significant improvement.  She was admitted with altered mental status and was found to have difficulty speaking and finding words which then progressed to total aphasia patient has been hospitalized she had prolonged hospital course.  Events: 11/5 admit 11/10 G tube placed by IR 11/13 PMT consulted 11/20 neuro consult. Rapid response for respiratory distress, treated with lasix. 11/21 cardiology consult for NSTEMI. 11/23 rapid response for respiratory distress, treated with lasix and morphine 11/27 ENT consulted for stridor, flexible laryngoscopy negative 12/1 rapid response for respiratory distress, treated with morphine 12/2 PCCM consulted for agonal respirations, transferred to ICU for intubation 12/3 Stable right atelectasis and effusion 12/6 Extubated " Currently patient is remains bedbound, lethargic, intermittently agitated.  Palliative care had seen the patient, but family wanted to pursue aggressive measures. Patient was transfused 1 unit of PRBC for chronic anemia 12/17.  As per family request patient was placed on Seroquel to help with agitation 12/17. Patient was transferred back to progressive unit 12/18.    Assessment & Plan:  Acute kidney injury on CKD stage III  Baseline creatinine about 11 months ago was in 2.0.  Renal function has been fluctuating.  BUN continues to climb.  Since her weight was noted to be about 5 kg more than what it was last week she was given a dose of Lasix and albumin on 12/26.  Unfortunately urine output measured accurately.  Weight has not changed much however patient's creatinine and BUN have increased.  Discussed with patient's son.  Will  consult nephrology to assist with management of this complicated patient.  Leave her on IV fluids at 20 mL/h for now.  Leave her on 200 mL of free water through the PEG tube 3 times a day.  Renal dysfunction is most likely due to cardiorenal.  We will get a renal ultrasound.  Patient appears to be appropriate for palliative care however family wants to pursue aggressive measures.  She appears to be developing signs of uremia.  She is nauseated this morning.  Acute on chronic combined systolic and diastolic CHF with EF 95%: Essential hypertension Judicious use of free water flush tube feeding due to CHF. Patient appears to have gained about 5 to 6 kg over the last 1 week.  She was 53 kg last week and was 60 kg on 12/26.  She was noted to have some edema.  She was given IV Lasix with albumin.  However her creatinine has climbed.  See discussion above.  Does not appear to have any pulmonary edema.    BiDil was discontinued due to hypotension.  No ACE inhibitor or ARB due to renal failure.  Patient is on beta-blockers.  Blood pressure is stable.  Hypernatremia Progressively worsening while the patient was undergoing treatment for CHF.  Was noted to have free water deficit.  Patient was given free water through PEG tube.  Given IV fluids with improvement in sodium levels.  Sodium level remains stable  Acute encephalopathy due to multiple comorbidities With complex comorbidity with pulmonary edema CHF with low EF, anemia, UTI aspiration pneumonia NSTEMI, polypharmacy.   Patient has been confused and agitated.  Mental status appears to be stable for the most part.  Son  requesting that Seroquel be discontinued.  This will be done as she is on a very low dose to begin with.  Donepezil was also discontinued.  Uremia is likely contributing.  EEG was unremarkable.    Acute hypoxic respiratory failure with ESBL Klebsiella aspiration pneumonia and pulmonary edema.   She remained stable from a respiratory  standpoint.  Continue aspiration precautions.  She is on tube feedings.    Aspiration pneumonia  Secondary to achalasia. Patient completed antibiotic therapy.  Abnormal LFTs LFTs were normal when last checked on 12/23.  Left temporal and frontal punctuate CVA Patient to continue statin and Plavix as per neurology.  Dopplers nondiagnostic as patient was uncooperative and TTE without evidence of vegetation/clot.  Enterococcus UTI Patient has completed course of antibiotics.     Non-ST elevation MI Treated medically. Continue Plavix and statin, metoprolol.   Anemia of chronic diseasase Patient given 1 unit of PRBC on 12/17.  Hemoglobin has been low but stable.  Patient's prophylactic anticoagulation has been discontinued as per son's request. He was notified about the risk of VTE.    Moderate protein calorie moderation/oropharyngeal dysphagia Continue tube feeding. Dietitian on board.  Type 2 diabetes mellitus, uncontrolled with hyper and hypoglycemia. With renal complication. CBGs are reasonably well controlled.  Continue to monitor.  Continue Lantus and SSI.  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 Continue symptomatic management, local spray and monitor.  Constipation/Fecal impaction This has been relieved with the suppositories.  Cut back on stool softeners as the patient is having loose stools now.    Tardive dyskinesia Improved with Cogentin.    DVT prophylaxis: Subcutaneous heparin was discontinued per family request Code Status: FULL Family Communication: Discussed with patient's son and daughter.   Disposition Plan: Nephrology consulted for worsening renal function.  Consultants: PCM, palliative care.  Nephrology  Antimicrobials: completed abx.  Subjective: Patient remains confused.  Unable to answer questions.  Daughter is at the bedside.  Objective: Vitals:   01/25/18 2140 01/25/18 2306 01/26/18 0500 01/26/18 0719  BP: (!) 120/58 (!) 96/50   (!) 111/59  Pulse: 77   72  Resp:  (!) 22  15  Temp:  97.8 F (36.6 C)  (!) 97.5 F (36.4 C)  TempSrc:  Oral  Oral  SpO2:  100%  100%  Weight:   62.2 kg   Height:        Intake/Output Summary (Last 24 hours) at 01/26/2018 1006 Last data filed at 01/26/2018 5852 Gross per 24 hour  Intake 1899.36 ml  Output 300 ml  Net 1599.36 ml   Filed Weights   01/17/18 0500 01/25/18 0500 01/26/18 0500  Weight: 55.1 kg 60 kg 62.2 kg   Weight change: 2.2 kg  Body mass index is 24.29 kg/m.  Examination: Awake alert.  Agitated at times. Normal effort noted at rest.  Diminished air entry at the bases.  Clear to auscultation otherwise Abdomen is soft.  Nontender nondistended.  PEG tube is noted. Mild edema in lower and upper extremities Moving all extremities.  Delirious at times.  No focal neurological deficits.  Data Reviewed: I have personally reviewed following labs and imaging studies  CBC: Recent Labs  Lab 01/22/18 0426 01/23/18 0339 01/23/18 1828 01/24/18 0440 01/26/18 0639  WBC 7.7 7.2 6.9 7.9 6.5  HGB 7.9* 7.5* 7.3* 7.7* 7.4*  HCT 26.6* 25.7* 25.2* 25.6* 25.7*  MCV 101.5* 101.2* 102.4* 100.4* 100.4*  PLT 176 155 157 164 778   Basic Metabolic Panel: Recent  Labs  Lab 01/20/18 0503  01/22/18 0900  01/23/18 0339 01/23/18 1828 01/24/18 0440 01/25/18 0700 01/26/18 0639  NA 155*   < > 148*   < > 149* 144 146* 146* 143  K 3.7   < > 4.4   < > 4.5 4.3 4.4 4.4 4.7  CL 112*   < > 108   < > 106 105 104 104 102  CO2 32   < > 31   < > 29 28 29 29 28   GLUCOSE 162*   < > 211*   < > 183* 197* 199* 122* 179*  BUN 105*   < > 111*   < > 119* 122* 125* 123* 134*  CREATININE 2.33*   < > 2.41*   < > 2.52* 2.52* 2.52* 2.59* 2.70*  CALCIUM 9.2   < > 9.0   < > 9.0 8.7* 8.9 9.1 9.0  MG 2.4  --  2.3  --   --   --   --   --   --   PHOS  --   --  4.4  --   --   --   --   --   --    < > = values in this interval not displayed.   GFR: Estimated Creatinine Clearance: 11.9 mL/min (A) (by  C-G formula based on SCr of 2.7 mg/dL (H)). Liver Function Tests: Recent Labs  Lab 01/22/18 0900  AST 24  ALT 32  ALKPHOS 62  BILITOT 0.6  PROT 6.2*  ALBUMIN 2.1*   CBG: Recent Labs  Lab 01/25/18 1700 01/25/18 1944 01/25/18 2303 01/26/18 0345 01/26/18 0720  GLUCAP 165* 175* 188* 174* 172*     Radiology Studies: No results found.   Scheduled Meds: . atorvastatin  40 mg Per Tube q1800  . benztropine  0.5 mg Per Tube BID  . chlorhexidine  15 mL Mouth Rinse BID  . cholecalciferol  400 Units Per Tube Daily  . clopidogrel  75 mg Per Tube Daily  . docusate  100 mg Per Tube Daily  . ferrous sulfate  300 mg Per Tube Daily  . free water  200 mL Per Tube Q8H  . insulin aspart  0-15 Units Subcutaneous Q4H  . insulin detemir  12 Units Subcutaneous Daily  . ipratropium  0.5 mg Nebulization BID   And  . levalbuterol  0.63 mg Nebulization BID  . mouth rinse  15 mL Mouth Rinse q12n4p  . metoprolol tartrate  25 mg Per Tube BID  . multivitamin  15 mL Per Tube Daily  . pantoprazole sodium  40 mg Per Tube Q1200   Continuous Infusions: . sodium chloride 20 mL/hr at 01/26/18 0500  . sodium chloride Stopped (01/08/18 2158)  . feeding supplement (VITAL AF 1.2 CAL) 1,000 mL (01/26/18 0410)     LOS: 74 days     Bonnielee Haff, MD [facility]  If 7PM-7AM, please contact night-coverage [email or web address] Password [email or web address] 01/26/2018, 10:06 AM

## 2018-01-26 NOTE — Plan of Care (Signed)
  Problem: Education: Goal: Knowledge of General Education information will improve Description Including pain rating scale, medication(s)/side effects and non-pharmacologic comfort measures  Outcome: Progressing   Problem: Health Behavior/Discharge Planning: Goal: Ability to manage health-related needs will improve Outcome: Progressing   Problem: Clinical Measurements: Goal: Ability to maintain clinical measurements within normal limits will improve Outcome: Progressing Goal: Will remain free from infection Outcome: Progressing Goal: Diagnostic test results will improve Outcome: Progressing Goal: Respiratory complications will improve Outcome: Progressing Goal: Cardiovascular complication will be avoided Outcome: Progressing   Problem: Activity: Goal: Risk for activity intolerance will decrease Outcome: Progressing   Problem: Nutrition: Goal: Adequate nutrition will be maintained Outcome: Progressing   Problem: Coping: Goal: Level of anxiety will decrease Outcome: Progressing   Problem: Elimination: Goal: Will not experience complications related to bowel motility Outcome: Progressing Goal: Will not experience complications related to urinary retention Outcome: Progressing   Problem: Safety: Goal: Ability to remain free from injury will improve Outcome: Progressing   Problem: Skin Integrity: Goal: Risk for impaired skin integrity will decrease Outcome: Progressing   Problem: Activity: Goal: Ability to tolerate increased activity will improve Outcome: Progressing   Problem: Cardiac: Goal: Ability to achieve and maintain adequate cardiopulmonary perfusion will improve Outcome: Progressing   Problem: Health Behavior/Discharge Planning: Goal: Ability to safely manage health-related needs after discharge will improve Outcome: Progressing   Problem: Education: Goal: Knowledge of secondary prevention will improve Outcome: Progressing Goal: Knowledge of patient  specific risk factors addressed and post discharge goals established will improve Outcome: Progressing Goal: Individualized Educational Video(s) Outcome: Progressing   Problem: Nutrition: Goal: Risk of aspiration will decrease Outcome: Progressing

## 2018-01-26 NOTE — Consult Note (Addendum)
[facility] Nurse wound consult note Reason for Consult: Consult requested for sacrum assessment.  Pt was noted to have a stage 2 pressure injury which was present on admission in the nursing flow sheet. She has been frequently incontinent of both urine and stool and it is difficult to keep the wound from becoming soiled related to the close proximity to the rectum.  She has multiple systemic factors which can impair healing. Wound type: Previous stage 2 has evolved into an unstageable pressure injury. Pressure Injury POA: Yes Measurement: 2X.5cm Wound bed: 100% tightly adhered yellow slough Drainage (amount, consistency, odor) small amt tan drainage, no odor or fluctuance Dressing procedure/placement/frequency: Begin Santyl to provide enzymatic debridement of nonviable tissue. No family present to discuss plan of care. Please re-consult if further assistance is needed.  Thank-you,  Julien Girt MSN, [place], [place], [place], [place]

## 2018-01-26 NOTE — Progress Notes (Signed)
  Speech Language Pathology Treatment: Dysphagia  Patient Details Name: [name] MRN: [id number] DOB: [birth year] Today's Date: 01/26/2018 Time: 3112-1624 SLP Time Calculation (min) (ACUTE ONLY): 11 min  Assessment / Plan / Recommendation Clinical Impression  Pt's mentation is more altered compared to most recent SLP visit, and she is cognitively having difficulty accepting PO boluses. Even though she verbalizes that she wants water, she has reduced sustained attention and makes minimal efforts to take boluses offered. No family was present this afternoon for education. Will continue efforts.   HPI HPI: 84 y.o. female with CAD s/p CABG, recent ischemic stroke, HTN, DM, HLP, recent failure to thrive due to swallowing problems s/p botox injection for achalasia, developed acute pulmonary edema and NSTEMI on 12/20/2017. Bedside swallow on 11/05/17 revealed functional oropharyngeal swallow and suspected esophageal dysphagia. Had esophagram 11/06/17 which showed moderately large hiatal hernia with poor motility and retained barium throughout the esophagus. PEG 12/10/17.  Repeat bedside swallow eval 12/23/17 with recs for NPO secondary to poor MS, no oral anticipation/recognition of POs. Pt with no change in MS that would allow safe oral intake between 11/23 and date of intubation,12/2.  Extubated 12/6. New orders for repeat bedside swallow eval s/p extubation.      SLP Plan  Continue with current plan of care       Recommendations  Diet recommendations: NPO;Other(comment)(family okay to give few, small spoonfuls of water if alert) Liquids provided via: Teaspoon Medication Administration: Via alternative means                Oral Care Recommendations: Oral care QID Follow up Recommendations: Skilled Nursing facility SLP Visit Diagnosis: Dysphagia, unspecified (R13.10) Plan: Continue with current plan of care       GO                [name] 01/26/2018, 4:12 PM  [name], M.A. [place] Acute Environmental education officer [phone] Office [phone]

## 2018-01-26 NOTE — Progress Notes (Signed)
[facility] Progress Note  Name: Adine Heimann MRN: [id number] DOB: January 25, 1928  Reason for re-consult: AKI   Subjective:  Patient previously seen by Dr. Hollie Salk earlier in the hospitalization; last evaluated 12/5.  She was felt to have AKI on CKD at that time which was felt multifactorial secondary to sepsis CHF.  She was characterized as a poor dialysis candidate given her functional status.  Now with progressive rise in Cr/BUN and declining urine output.  She had 300 mL uop over 12/26 but did have 4 unquantified voids.  She is s/p lasix yesterday. She has had a prolonged hospitalization complicated by asp PNA and UTI and CVA.  Renal US today with 10.4 and 9.4 cm kidneys; no hydro.  Spoke with her son via phone who agrees that this is likely pre-renal and agrees with gentle IV fluids - 250 mL NS.  Review of systems:  Unable to obtain 2/2 AMS   Intake/Output Summary (Last 24 hours) at 01/26/2018 1647 Last data filed at 01/26/2018 1300 Gross per 24 hour  Intake 1209.86 ml  Output 1500 ml  Net -290.14 ml    Vitals:  Vitals:   01/25/18 2306 01/26/18 0500 01/26/18 0719 01/26/18 1145  BP: (!) 96/50  (!) 111/59   Pulse:   72   Resp: (!) 22  15   Temp: 97.8 F (36.6 C)  (!) 97.5 F (36.4 C)   TempSrc: Oral  Oral   SpO2: 100%  100% 100%  Weight:  62.2 kg    Height:         Physical Exam:  General elderly female in bed chronically ill  HEENT normocephalic atraumatic extraocular movements intact sclera anicteric Neck supple trachea midline Lungs clear to auscultation bilaterally normal work of breathing at rest  Heart regular rate and rhythm no rubs or gallops appreciated Abdomen soft nontender nondistended; PEG tube in place Neuro - nonverbal and does not follow commands Extremities no edema  Psych no agitation   Medications reviewed   Labs:  BMP Latest Ref Rng & Units 01/26/2018 01/25/2018 01/24/2018  Glucose 70 - 99 mg/dL 179(H) 122(H) 199(H)  BUN 8 - 23  mg/dL 134(H) 123(H) 125(H)  Creatinine 0.44 - 1.00 mg/dL 2.70(H) 2.59(H) 2.52(H)  Sodium 135 - 145 mmol/L 143 146(H) 146(H)  Potassium 3.5 - 5.1 mmol/L 4.7 4.4 4.4  Chloride 98 - 111 mmol/L 102 104 104  CO2 22 - 32 mmol/L 28 29 29   Calcium 8.9 - 10.3 mg/dL 9.0 9.1 8.9     Assessment/Plan:  # AKI  - multifactorial with pre-renal and ischemic insults with sepsis and hypotension  - patient is unfortunately a poor dialysis candidate due to her functional status - Continue supportive care  - Would defer lasix for now unless needed for respiratory distress - NS 250 mL mini-bolus for now.  Spoke with her son via phone and he is ok with this.  Spoke with primary team   # CKD stage III  - Baseline Cr 1.5 earlier this fall with Cr as high as 2 -2.5 from 08/2016 - 01/2017; previously seen by Dr. Florene Glen   # HTN  - now with hypotension  - mini-bolus  # Acute on chronic systolic and diastolic CHF  - EF reported as 30%  - cautious hydration as above  # Achalasia- s/p G-tube placement and on feeds  # Hypernatremia - improving with free water   # Hx CVA  # Altered mental status - appreciate assistance of family and nursing  Claudia Desanctis, MD 01/26/2018 4:47 PM

## 2018-01-27 LAB — GLUCOSE, CAPILLARY
Glucose-Capillary: 127 mg/dL — ABNORMAL HIGH (ref 70–99)
Glucose-Capillary: 145 mg/dL — ABNORMAL HIGH (ref 70–99)
Glucose-Capillary: 147 mg/dL — ABNORMAL HIGH (ref 70–99)
Glucose-Capillary: 167 mg/dL — ABNORMAL HIGH (ref 70–99)
Glucose-Capillary: 171 mg/dL — ABNORMAL HIGH (ref 70–99)
Glucose-Capillary: 90 mg/dL (ref 70–99)

## 2018-01-27 LAB — BASIC METABOLIC PANEL
ANION GAP: 13 (ref 5–15)
BUN: 140 mg/dL — ABNORMAL HIGH (ref 8–23)
CO2: 27 mmol/L (ref 22–32)
Calcium: 8.8 mg/dL — ABNORMAL LOW (ref 8.9–10.3)
Chloride: 101 mmol/L (ref 98–111)
Creatinine, Ser: 2.7 mg/dL — ABNORMAL HIGH (ref 0.44–1.00)
GFR calc Af Amer: 18 mL/min — ABNORMAL LOW (ref >60)
GFR calc non Af Amer: 15 mL/min — ABNORMAL LOW (ref >60)
Glucose, Bld: 170 mg/dL — ABNORMAL HIGH (ref 70–99)
POTASSIUM: 4.6 mmol/L (ref 3.5–5.1)
Sodium: 141 mmol/L (ref 135–145)

## 2018-01-27 MED ORDER — VITAL AF 1.2 CAL PO LIQD
1000.0000 mL | ORAL | Status: DC
  Administered 2018-01-27: 1000 mL
  Filled 2018-01-27 (×3): qty 1000

## 2018-01-27 MED ORDER — SODIUM CHLORIDE 0.9 % IV SOLN
INTRAVENOUS | Status: AC
  Administered 2018-01-27: 18:00:00 via INTRAVENOUS

## 2018-01-27 MED ORDER — FREE WATER
250.0000 mL | Freq: Three times a day (TID) | Status: DC
  Administered 2018-01-27 – 2018-02-14 (×52): 250 mL

## 2018-01-27 MED ORDER — HYDROCODONE-ACETAMINOPHEN 5-325 MG PO TABS
1.0000 | ORAL_TABLET | ORAL | Status: DC | PRN
  Administered 2018-01-27: 1
  Administered 2018-01-29: 2
  Administered 2018-01-29 – 2018-02-04 (×7): 1
  Administered 2018-02-08 – 2018-02-11 (×9): 2
  Administered 2018-02-14 (×3): 1
  Administered 2018-02-15 – 2018-02-16 (×3): 2
  Administered 2018-02-16 – 2018-02-22 (×2): 1
  Filled 2018-01-27 (×2): qty 1
  Filled 2018-01-27 (×2): qty 2
  Filled 2018-01-27: qty 1
  Filled 2018-01-27: qty 2
  Filled 2018-01-27: qty 1
  Filled 2018-01-27 (×2): qty 2
  Filled 2018-01-27 (×3): qty 1
  Filled 2018-01-27 (×3): qty 2
  Filled 2018-01-27 (×2): qty 1
  Filled 2018-01-27: qty 2
  Filled 2018-01-27 (×3): qty 1
  Filled 2018-01-27 (×2): qty 2
  Filled 2018-01-27: qty 1
  Filled 2018-01-27: qty 2
  Filled 2018-01-27 (×2): qty 1
  Filled 2018-01-27 (×2): qty 2

## 2018-01-27 NOTE — Progress Notes (Signed)
[facility] Progress Note  Name: Rosaleah Person MRN: [id number] DOB: [birth year]  Subjective:  Patient has continued with confusion.  She has had 1.4 liters UOP over 12/27 as well as three uncharted voids.  She got a mini-bolus yesterday of 250 mL NS.  Spoke with nursing.  No difficulty with breathing or nausea observed today.    Review of systems:  Unable to obtain 2/2 AMS ------------------------- Background on re-consult:  Patient previously seen by Dr. Hollie Salk earlier in the hospitalization; last evaluated 12/5.  She was felt to have AKI on CKD at that time which was felt multifactorial secondary to sepsis CHF.  She was characterized as a poor dialysis candidate given her functional status.  Now with progressive rise in Cr/BUN and declining urine output.  She had 300 mL uop over 12/26 but did have 4 unquantified voids.  She is s/p lasix yesterday. She has had a prolonged hospitalization complicated by asp PNA and UTI and CVA.  Renal US today with 10.4 and 9.4 cm kidneys; no hydro.  Spoke with her son via phone who agrees that this is likely pre-renal and agrees with gentle IV fluids - 250 mL NS.   Intake/Output Summary (Last 24 hours) at 01/27/2018 1637 Last data filed at 01/27/2018 1600 Gross per 24 hour  Intake 2793.04 ml  Output 926 ml  Net 1867.04 ml    Vitals:  Vitals:   01/26/18 2214 01/27/18 0459 01/27/18 0838 01/27/18 1134  BP: 126/62  (!) 131/57   Pulse: 64  69   Resp: 20  16   Temp: (!) 97.5 F (36.4 C)  98 F (36.7 C)   TempSrc: Oral  Oral   SpO2: 100%  100% 100%  Weight:  61.8 kg    Height:         Physical Exam:  General elderly female in bed chronically ill  HEENT normocephalic atraumatic extraocular movements intact sclera anicteric Neck supple trachea midline Lungs clear to auscultation bilaterally normal work of breathing at rest  Heart regular rate and rhythm no rubs or gallops appreciated Abdomen soft nontender nondistended; PEG tube in  place Neuro - disoriented and does not follow commands; calls out today Extremities no edema  Psych patient with agitation   Medications reviewed   Labs:  BMP Latest Ref Rng & Units 01/27/2018 01/26/2018 01/25/2018  Glucose 70 - 99 mg/dL 170(H) 179(H) 122(H)  BUN 8 - 23 mg/dL 140(H) 134(H) 123(H)  Creatinine 0.44 - 1.00 mg/dL 2.70(H) 2.70(H) 2.59(H)  Sodium 135 - 145 mmol/L 141 143 146(H)  Potassium 3.5 - 5.1 mmol/L 4.6 4.7 4.4  Chloride 98 - 111 mmol/L 101 102 104  CO2 22 - 32 mmol/L 27 28 29   Calcium 8.9 - 10.3 mg/dL 8.8(L) 9.0 9.1     Assessment/Plan:  # AKI  - multifactorial with pre-renal and ischemic insults with sepsis and hypotension  - patient is unfortunately a poor dialysis candidate due to her functional status - Continue supportive care  - Normal saline - run at 75 mL/hr for 7 hours (around 500 mL)  # CKD stage III  - Baseline Cr 1.5 earlier this fall with Cr as high as 2 -2.5 from 08/2016 - 01/2017; previously seen by Dr. Florene Glen   # HTN  - Hypotension improved   # Acute on chronic systolic and diastolic CHF  - EF reported as 30%  - cautious hydration as above  - Currently in no acute distress and appears depleted on exam   #  Achalasia- s/p G-tube placement and on feeds  # Hypernatremia - improving with free water replacement   # Hx CVA  # Altered mental status - appreciate assistance of family and nursing with history - compounded by uremia and AKI  - gentle fluids as above   Claudia Desanctis, MD 01/27/2018 4:37 PM

## 2018-01-27 NOTE — Progress Notes (Addendum)
PROGRESS NOTE    Kaliopi Blyden  FAO:[id number] DOB: 1927-07-26 DOA: 12/05/2017 PCP: Deland Pretty, MD   Brief Narrative: Pt with PMH of diabetes, hypertension, hyperlipidemia, CAD status post CABG, history of CVA, CKD stage III who was recently diagnosed with achalasia and was being treated with Botox with no significant improvement.  She was admitted with altered mental status and was found to have difficulty speaking and finding words which then progressed to total aphasia patient has been hospitalized she had prolonged hospital course.  Events: 11/5 admit 11/10 G tube placed by IR 11/13 PMT consulted 11/20 neuro consult. Rapid response for respiratory distress, treated with lasix. 11/21 cardiology consult for NSTEMI. 11/23 rapid response for respiratory distress, treated with lasix and morphine 11/27 ENT consulted for stridor, flexible laryngoscopy negative 12/1 rapid response for respiratory distress, treated with morphine 12/2 PCCM consulted for agonal respirations, transferred to ICU for intubation 12/3 Stable right atelectasis and effusion 12/6 Extubated " Currently patient is remains bedbound, lethargic, intermittently agitated.  Palliative care had seen the patient, but family wanted to pursue aggressive measures. Patient was transfused 1 unit of PRBC for chronic anemia 12/17.  As per family request patient was placed on Seroquel to help with agitation 12/17. Patient was transferred back to progressive unit 12/18.    Assessment & Plan:  Acute kidney injury on CKD stage III  Baseline creatinine about 11 months ago was in 2.0.  Patient's creatinine has been slowly climbing.  Renal ultrasound done on 12/27 does not show any intrinsic renal disease.  No hydronephrosis.  This appears to be mostly due to cardiorenal perhaps with an element of hypovolemia.  Patient was given a dose of Lasix on 12/26 as she was noted to have weight gain.  However her renal function worsened with BUN  increasing.  Nephrology was consulted.  Patient was given fluid bolus.  She did have good urine output over the last 24 hours.  Creatinine is stable.  BUN is higher.  Discussed with patient's son.  Will wait for further nephrology recommendations.  Patient with some signs of uremia.  Patient likely not a candidate for hemodialysis.    Acute on chronic combined systolic and diastolic CHF with EF 69%: Essential hypertension Cardiac status appears to be stable.  No significant response to IV Lasix given 2 days ago.  Thought to be hypovolemic yesterday and was given a small fluid bolus.  Does not appear to have any overt pulmonary edema currently.  Respiratory status is stable.   BiDil was discontinued due to hypotension.  No ACE inhibitor or ARB due to renal failure.  Patient is on beta-blockers.  Blood pressure is stable.  Hypernatremia Progressively worsening while the patient was undergoing treatment for CHF.  Was noted to have free water deficit.  Patient was given free water through PEG tube.  Given IV fluids with improvement in sodium levels.  Sodium level remains stable.  Acute encephalopathy due to multiple comorbidities Mentation remained stable.  She remains agitated and confused..   Seroquel was discontinued per family request.  Donepezil was also discontinued.  Uremia is likely contributing.  EEG was unremarkable.     Acute hypoxic respiratory failure with ESBL Klebsiella aspiration pneumonia and pulmonary edema.   She remained stable from a respiratory standpoint.  Continue aspiration precautions.  She is on tube feedings.    Aspiration pneumonia  Secondary to achalasia. Patient completed antibiotic therapy.  Abnormal LFTs LFTs were normal when last checked on 12/23.  Left temporal  and frontal punctuate CVA Patient to continue statin and Plavix as per neurology.  Dopplers nondiagnostic as patient was uncooperative and TTE without evidence of vegetation/clot.  Enterococcus  UTI Patient has completed course of antibiotics.     Non-ST elevation MI Treated medically. Continue Plavix and statin, metoprolol.   Anemia of chronic diseasase Patient given 1 unit of PRBC on 12/17.  Hemoglobin has been low but stable.  Patient's prophylactic anticoagulation has been discontinued as per son's request. He was notified about the risk of VTE.    Moderate protein calorie moderation/oropharyngeal dysphagia Continue tube feeding. Dietitian on board.  Type 2 diabetes mellitus, uncontrolled with hyper and hypoglycemia. With renal complication. CBGs are reasonably well controlled.  Continue to monitor.  Continue Lantus and SSI.  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 Continue symptomatic management, local spray and monitor.  Constipation/Fecal impaction This has been relieved with the suppositories.   Tardive dyskinesia Improved with Cogentin.    DVT prophylaxis: Subcutaneous heparin was discontinued per family request Code Status: FULL Family Communication: Discussed with patient's son and daughter.   Disposition Plan: Patient remains encephalopathic.  BUN continues to climb.  Prognosis remains poor.  Consultants: PCM, palliative care.  Nephrology  Antimicrobials: completed abx.  Subjective: Patient remains confused.  Unable to answer any questions.  Daughter at the bedside.  Objective: Vitals:   01/26/18 2214 01/27/18 0459 01/27/18 0838 01/27/18 1134  BP: 126/62  (!) 131/57   Pulse: 64  69   Resp: 20  16   Temp: (!) 97.5 F (36.4 C)  98 F (36.7 C)   TempSrc: Oral  Oral   SpO2: 100%  100% 100%  Weight:  61.8 kg    Height:        Intake/Output Summary (Last 24 hours) at 01/27/2018 1226 Last data filed at 01/27/2018 1014 Gross per 24 hour  Intake 2063.04 ml  Output 1476 ml  Net 587.04 ml   Filed Weights   01/25/18 0500 01/26/18 0500 01/27/18 0459  Weight: 60 kg 62.2 kg 61.8 kg   Weight change: -0.4 kg  Body mass index is  24.13 kg/m.  Examination: Awake alert.  Agitated at times. Normal effort at rest.  Diminished air entry at the bases. Abdomen is soft.  Nontender nondistended.  PEG tube noted. Mild edema in lower and upper extremities Confused.  Moving all extremities.  Data Reviewed: I have personally reviewed following labs and imaging studies  CBC: Recent Labs  Lab 01/22/18 0426 01/23/18 0339 01/23/18 1828 01/24/18 0440 01/26/18 0639  WBC 7.7 7.2 6.9 7.9 6.5  HGB 7.9* 7.5* 7.3* 7.7* 7.4*  HCT 26.6* 25.7* 25.2* 25.6* 25.7*  MCV 101.5* 101.2* 102.4* 100.4* 100.4*  PLT 176 155 157 164 213   Basic Metabolic Panel: Recent Labs  Lab 01/22/18 0900  01/23/18 1828 01/24/18 0440 01/25/18 0700 01/26/18 0639 01/27/18 0531  NA 148*   < > 144 146* 146* 143 141  K 4.4   < > 4.3 4.4 4.4 4.7 4.6  CL 108   < > 105 104 104 102 101  CO2 31   < > 28 29 29 28 27   GLUCOSE 211*   < > 197* 199* 122* 179* 170*  BUN 111*   < > 122* 125* 123* 134* 140*  CREATININE 2.41*   < > 2.52* 2.52* 2.59* 2.70* 2.70*  CALCIUM 9.0   < > 8.7* 8.9 9.1 9.0 8.8*  MG 2.3  --   --   --   --   --   --  PHOS 4.4  --   --   --   --   --   --    < > = values in this interval not displayed.   GFR: Estimated Creatinine Clearance: 11.9 mL/min (A) (by C-G formula based on SCr of 2.7 mg/dL (H)). Liver Function Tests: Recent Labs  Lab 01/22/18 0900  AST 24  ALT 32  ALKPHOS 62  BILITOT 0.6  PROT 6.2*  ALBUMIN 2.1*   CBG: Recent Labs  Lab 01/26/18 1937 01/27/18 0038 01/27/18 0430 01/27/18 0837 01/27/18 1136  GLUCAP 144* 127* 147* 171* 167*     Radiology Studies: US Renal  Result Date: 01/26/2018 CLINICAL DATA:  Acute renal failure EXAM: RENAL / URINARY TRACT ULTRASOUND COMPLETE COMPARISON:  CT abdomen pelvis 11/04/2016 FINDINGS: Right Kidney: Renal measurements: 10.4 x 4.6 x 4.4 cm. = volume: 111 mL. The echogenicity of the renal parenchyma is within normal limits. No hydronephrosis is seen. Left Kidney: Renal  measurements: 9.4 x 5.9 x 6.2 cm = volume: 178 mL. No hydronephrosis is seen. The echogenicity of the kidney is unremarkable. Bladder: The urinary bladder is unremarkable. Incidentally there is a small amount of ascites present. Also there is a left pleural effusion noted. Also a pelvic mass is present to the left of midline consistent with uterine fibroid of 8.2 x 7.9 x 7.7 cm with some calcification IMPRESSION: 1. No hydronephrosis. The renal parenchyma appears within normal limits bilaterally. 2. Small amount of ascites.  Left pleural effusion. 3. Pelvic mass consistent with uterine fibroid when compared with prior CT of the abdomen pelvis. Electronically Signed   By: Ivar Drape M.D.   On: 01/26/2018 14:22     Scheduled Meds: . atorvastatin  40 mg Per Tube q1800  . benztropine  0.5 mg Per Tube BID  . chlorhexidine  15 mL Mouth Rinse BID  . cholecalciferol  400 Units Per Tube Daily  . clopidogrel  75 mg Per Tube Daily  . collagenase   Topical Daily  . docusate  100 mg Per Tube Daily  . ferrous sulfate  300 mg Per Tube Daily  . free water  250 mL Per Tube Q8H  . insulin aspart  0-15 Units Subcutaneous Q4H  . insulin detemir  12 Units Subcutaneous Daily  . ipratropium  0.5 mg Nebulization BID   And  . levalbuterol  0.63 mg Nebulization BID  . mouth rinse  15 mL Mouth Rinse q12n4p  . metoprolol tartrate  25 mg Per Tube BID  . multivitamin  15 mL Per Tube Daily  . pantoprazole sodium  40 mg Per Tube Q1200   Continuous Infusions: . sodium chloride 20 mL/hr at 01/27/18 0526  . sodium chloride Stopped (01/08/18 2158)  . feeding supplement (VITAL AF 1.2 CAL) 1,000 mL (01/27/18 1014)     LOS: 33 days     Bonnielee Haff, MD [facility]  If 7PM-7AM, please contact night-coverage [email or web address] Password [facility] 01/27/2018, 12:26 PM

## 2018-01-27 NOTE — Plan of Care (Signed)
  Problem: Elimination: Goal: Will not experience complications related to urinary retention Outcome: Progressing   Problem: Skin Integrity: Goal: Risk for impaired skin integrity will decrease Outcome: Not Progressing

## 2018-01-28 LAB — BASIC METABOLIC PANEL
Anion gap: 12 (ref 5–15)
BUN: 137 mg/dL — ABNORMAL HIGH (ref 8–23)
CO2: 29 mmol/L (ref 22–32)
Calcium: 9 mg/dL (ref 8.9–10.3)
Chloride: 101 mmol/L (ref 98–111)
Creatinine, Ser: 2.66 mg/dL — ABNORMAL HIGH (ref 0.44–1.00)
GFR calc Af Amer: 18 mL/min — ABNORMAL LOW (ref >60)
GFR calc non Af Amer: 15 mL/min — ABNORMAL LOW (ref >60)
Glucose, Bld: 128 mg/dL — ABNORMAL HIGH (ref 70–99)
Potassium: 4.7 mmol/L (ref 3.5–5.1)
Sodium: 142 mmol/L (ref 135–145)

## 2018-01-28 LAB — GLUCOSE, CAPILLARY
Glucose-Capillary: 83 mg/dL (ref 70–99)
Glucose-Capillary: 108 mg/dL — ABNORMAL HIGH (ref 70–99)
Glucose-Capillary: 115 mg/dL — ABNORMAL HIGH (ref 70–99)
Glucose-Capillary: 140 mg/dL — ABNORMAL HIGH (ref 70–99)
Glucose-Capillary: 168 mg/dL — ABNORMAL HIGH (ref 70–99)
Glucose-Capillary: 179 mg/dL — ABNORMAL HIGH (ref 70–99)
Glucose-Capillary: 155 mg/dL — ABNORMAL HIGH (ref 70–99)

## 2018-01-28 MED ORDER — SODIUM CHLORIDE 0.9 % IV SOLN
INTRAVENOUS | Status: AC
  Administered 2018-01-28: 17:00:00 via INTRAVENOUS

## 2018-01-28 NOTE — Progress Notes (Signed)
Family-daughter of pt informed that no additional fluids was initiated at this time. Family upset and threatens that staff has made errors on patient care. Family given visuals to help see what the approximate amounts would be with the times they requested. Family also made aware that pt iv lines would get flushed with lab work and and as part of maintenance care. Daughter states I can only imagine how many people die in this hospital. Apologized to daughter that she felt this way. Daughter threatened that records would be pulled and people would be held accountable. No further assistance requested or apparently needed at this time. Pt remains resting, no apparent pain/nor distress and safe.

## 2018-01-28 NOTE — Progress Notes (Signed)
PROGRESS NOTE    [name]  QAS:[id number] DOB: 02/15/1927 DOA: 12/05/2017 PCP: Deland Pretty, MD   Brief Narrative: Pt with PMH of diabetes, hypertension, hyperlipidemia, CAD status post CABG, history of CVA, CKD stage III who was recently diagnosed with achalasia and was being treated with Botox with no significant improvement.  She was admitted with altered mental status and was found to have difficulty speaking and finding words which then progressed to total aphasia patient has been hospitalized she had prolonged hospital course.  Events: 11/5 admit 11/10 G tube placed by IR 11/13 PMT consulted 11/20 neuro consult. Rapid response for respiratory distress, treated with lasix. 11/21 cardiology consult for NSTEMI. 11/23 rapid response for respiratory distress, treated with lasix and morphine 11/27 ENT consulted for stridor, flexible laryngoscopy negative 12/1 rapid response for respiratory distress, treated with morphine 12/2 PCCM consulted for agonal respirations, transferred to ICU for intubation 12/3 Stable right atelectasis and effusion 12/6 Extubated " Currently patient is remains bedbound, lethargic, intermittently agitated.  Palliative care had seen the patient, but family wanted to pursue aggressive measures. Patient was transfused 1 unit of PRBC for chronic anemia 12/17.  As per family request patient was placed on Seroquel to help with agitation 12/17. Patient was transferred back to progressive unit 12/18.    Assessment & Plan:  Acute kidney injury on CKD stage III  Baseline creatinine about 11 months ago was in 2.0.  Patient's creatinine has been slowly climbing.  Renal ultrasound done on 12/27 does not show any intrinsic renal disease.  No hydronephrosis.  This appears to be mostly due to cardiorenal perhaps with an element of hypovolemia.  Patient was given a dose of Lasix on 12/26 as she was noted to have weight gain.  However her renal function worsened with BUN  increasing.  Nephrology was consulted.  Patient was given fluid bolus with good response.  Creatinine appears to be slightly better today BUN is stable.  Appreciate nephrology assistance.  Discussed with patient's son and daughter.    Acute on chronic combined systolic and diastolic CHF with EF 79%: Essential hypertension Cardiac status appears to be stable.  Not on diuretics as patient has thought to be hypovolemic. BiDil was discontinued due to hypotension.  No ACE inhibitor or ARB due to renal failure.  Patient is on beta-blockers.  Blood pressure is stable.  Hypernatremia Sodium level worsened while the patient was undergoing treatment for CHF.  Was noted to have free water deficit.  Patient was given free water through PEG tube.  G urine level has improved.  Acute encephalopathy due to multiple comorbidities Mentation remained stable.  She remains agitated and confused..   Seroquel was discontinued per family request.  Donepezil was also discontinued.  Uremia is likely contributing.  EEG was unremarkable.     Acute hypoxic respiratory failure with ESBL Klebsiella aspiration pneumonia and pulmonary edema.   She remains stable from a respiratory standpoint.  Continue aspiration precautions.  She is on tube feedings.   Aspiration pneumonia  Secondary to achalasia. Patient completed antibiotic therapy.  Abnormal LFTs LFTs were normal when last checked on 12/23.  Left temporal and frontal punctuate CVA Patient to continue statin and Plavix as per neurology.  Dopplers nondiagnostic as patient was uncooperative and TTE without evidence of vegetation/clot.  Enterococcus UTI Patient has completed course of antibiotics.     Non-ST elevation MI Treated medically. Continue Plavix and statin, metoprolol.   Anemia of chronic diseasase Patient given 1 unit of PRBC  on 12/17.  Hemoglobin has been low but stable.  Patient's prophylactic anticoagulation has been discontinued as per son's request.  He was notified about the risk of VTE.    Moderate protein calorie moderation/oropharyngeal dysphagia Continue tube feeding. Dietitian on board.  Type 2 diabetes mellitus, uncontrolled with hyper and hypoglycemia. With renal complication. CBGs are reasonably well controlled.  Continue to monitor.  Continue Lantus and SSI.  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 Continue symptomatic management, local spray and monitor.  Constipation/Fecal impaction This has been relieved with the suppositories.   Tardive dyskinesia Improved with Cogentin.    DVT prophylaxis: Subcutaneous heparin was discontinued per family request Code Status: FULL Family Communication: Discussed with patient's son and daughter.   Disposition Plan: Patient remains encephalopathic.  BUN and creatinine stable this morning.  Consultants: PCM, palliative care.  Nephrology  Antimicrobials: completed abx.  Subjective: Patient remains confused.  Unable to answer any questions.  Daughter is at the bedside.  Objective: Vitals:   01/27/18 2146 01/27/18 2304 01/28/18 0802 01/28/18 1135  BP: 135/66 (!) 110/55 131/65   Pulse: 75 62 70   Resp:  20 11   Temp:  (!) 97.4 F (36.3 C) (!) 97.4 F (36.3 C)   TempSrc:  Oral Oral   SpO2: 100% 100% 96% 96%  Weight:      Height:        Intake/Output Summary (Last 24 hours) at 01/28/2018 1243 Last data filed at 01/28/2018 1200 Gross per 24 hour  Intake 1555 ml  Output 951 ml  Net 604 ml   Filed Weights   01/25/18 0500 01/26/18 0500 01/27/18 0459  Weight: 60 kg 62.2 kg 61.8 kg   Weight change:   Body mass index is 24.13 kg/m.  Examination: Awake alert.  Agitated.  Distracted. Normal effort at rest.  Diminished air entry at the bases. Abdomen is soft.  Nontender nondistended.  PEG tube noted. Confused.  Moving all extremities.  Data Reviewed: I have personally reviewed following labs and imaging studies  CBC: Recent Labs  Lab 01/22/18 0426  01/23/18 0339 01/23/18 1828 01/24/18 0440 01/26/18 0639  WBC 7.7 7.2 6.9 7.9 6.5  HGB 7.9* 7.5* 7.3* 7.7* 7.4*  HCT 26.6* 25.7* 25.2* 25.6* 25.7*  MCV 101.5* 101.2* 102.4* 100.4* 100.4*  PLT 176 155 157 164 341   Basic Metabolic Panel: Recent Labs  Lab 01/22/18 0900  01/24/18 0440 01/25/18 0700 01/26/18 0639 01/27/18 0531 01/28/18 0413  NA 148*   < > 146* 146* 143 141 142  K 4.4   < > 4.4 4.4 4.7 4.6 4.7  CL 108   < > 104 104 102 101 101  CO2 31   < > 29 29 28 27 29   GLUCOSE 211*   < > 199* 122* 179* 170* 128*  BUN 111*   < > 125* 123* 134* 140* 137*  CREATININE 2.41*   < > 2.52* 2.59* 2.70* 2.70* 2.66*  CALCIUM 9.0   < > 8.9 9.1 9.0 8.8* 9.0  MG 2.3  --   --   --   --   --   --   PHOS 4.4  --   --   --   --   --   --    < > = values in this interval not displayed.   GFR: Estimated Creatinine Clearance: 12.1 mL/min (A) (by C-G formula based on SCr of 2.66 mg/dL (H)). Liver Function Tests: Recent Labs  Lab 01/22/18  0900  AST 24  ALT 32  ALKPHOS 62  BILITOT 0.6  PROT 6.2*  ALBUMIN 2.1*   CBG: Recent Labs  Lab 01/28/18 0058 01/28/18 0336 01/28/18 0343 01/28/18 0757 01/28/18 1130  GLUCAP 83 108* 115* 140* 168*     Radiology Studies: US Renal  Result Date: 01/26/2018 CLINICAL DATA:  Acute renal failure EXAM: RENAL / URINARY TRACT ULTRASOUND COMPLETE COMPARISON:  CT abdomen pelvis 11/04/2016 FINDINGS: Right Kidney: Renal measurements: 10.4 x 4.6 x 4.4 cm. = volume: 111 mL. The echogenicity of the renal parenchyma is within normal limits. No hydronephrosis is seen. Left Kidney: Renal measurements: 9.4 x 5.9 x 6.2 cm = volume: 178 mL. No hydronephrosis is seen. The echogenicity of the kidney is unremarkable. Bladder: The urinary bladder is unremarkable. Incidentally there is a small amount of ascites present. Also there is a left pleural effusion noted. Also a pelvic mass is present to the left of midline consistent with uterine fibroid of 8.2 x 7.9 x 7.7 cm with  some calcification IMPRESSION: 1. No hydronephrosis. The renal parenchyma appears within normal limits bilaterally. 2. Small amount of ascites.  Left pleural effusion. 3. Pelvic mass consistent with uterine fibroid when compared with prior CT of the abdomen pelvis. Electronically Signed   By: Ivar Drape M.D.   On: 01/26/2018 14:22     Scheduled Meds: . atorvastatin  40 mg Per Tube q1800  . benztropine  0.5 mg Per Tube BID  . chlorhexidine  15 mL Mouth Rinse BID  . cholecalciferol  400 Units Per Tube Daily  . clopidogrel  75 mg Per Tube Daily  . collagenase   Topical Daily  . docusate  100 mg Per Tube Daily  . ferrous sulfate  300 mg Per Tube Daily  . free water  250 mL Per Tube Q8H  . insulin aspart  0-15 Units Subcutaneous Q4H  . insulin detemir  12 Units Subcutaneous Daily  . ipratropium  0.5 mg Nebulization BID   And  . levalbuterol  0.63 mg Nebulization BID  . mouth rinse  15 mL Mouth Rinse q12n4p  . metoprolol tartrate  25 mg Per Tube BID  . multivitamin  15 mL Per Tube Daily  . pantoprazole sodium  40 mg Per Tube Q1200   Continuous Infusions: . sodium chloride 20 mL/hr at 01/27/18 0526  . sodium chloride Stopped (01/08/18 2158)  . feeding supplement (VITAL AF 1.2 CAL) 1,000 mL (01/27/18 1014)     LOS: 29 days     Bonnielee Haff, MD [facility]  If 7PM-7AM, please contact night-coverage [email or web address] Password [facility] 01/28/2018, 12:43 PM

## 2018-01-28 NOTE — Progress Notes (Signed)
[facility] Progress Note  Name: [name] MRN: [id number] DOB: Aug 11, 1927  Subjective:  Spoke with nursing.  She received the 500 mL infusion of NS yesterday.  Pt with 1.3 liters UOP over 12/28.  Spoke with son, Dr. Mayer Masker, at bedside.  He is in agreement with normal saline at 75 ml/hr x 1 liter then stopping continuous fluids for reassessment.    Review of systems:  Unable to obtain 2/2 AMS ------------------------- Background on re-consult:  Patient previously seen by Dr. Hollie Salk earlier in the hospitalization; last evaluated 12/5.  She was felt to have AKI on CKD at that time which was felt multifactorial secondary to sepsis CHF.  She was characterized as a poor dialysis candidate given her functional status.  Now with progressive rise in Cr/BUN and declining urine output.  She had 300 mL uop over 12/26 but did have 4 unquantified voids.  She is s/p lasix yesterday. She has had a prolonged hospitalization complicated by asp PNA and UTI and CVA.  Renal US today with 10.4 and 9.4 cm kidneys; no hydro.  Spoke with her son via phone who agrees that this is likely pre-renal and agrees with gentle IV fluids - 250 mL NS.   Intake/Output Summary (Last 24 hours) at 01/28/2018 1551 Last data filed at 01/28/2018 1200 Gross per 24 hour  Intake 1555 ml  Output 951 ml  Net 604 ml    Vitals:  Vitals:   01/27/18 2146 01/27/18 2304 01/28/18 0802 01/28/18 1135  BP: 135/66 (!) 110/55 131/65   Pulse: 75 62 70   Resp:  20 11   Temp:  (!) 97.4 F (36.3 C) (!) 97.4 F (36.3 C)   TempSrc:  Oral Oral   SpO2: 100% 100% 96% 96%  Weight:      Height:         Physical Exam:  General elderly female in bed chronically ill  HEENT normocephalic atraumatic extraocular movements intact sclera anicteric Neck supple trachea midline Lungs clear to auscultation bilaterally normal work of breathing at rest  Heart regular rate and rhythm no rubs or gallops appreciated Abdomen soft nontender  nondistended; PEG tube in place Neuro - disoriented and does not follow commands; calls out today Extremities no edema  Psych patient with agitation   Medications reviewed   Labs:  BMP Latest Ref Rng & Units 01/28/2018 01/27/2018 01/26/2018  Glucose 70 - 99 mg/dL 128(H) 170(H) 179(H)  BUN 8 - 23 mg/dL 137(H) 140(H) 134(H)  Creatinine 0.44 - 1.00 mg/dL 2.66(H) 2.70(H) 2.70(H)  Sodium 135 - 145 mmol/L 142 141 143  Potassium 3.5 - 5.1 mmol/L 4.7 4.6 4.7  Chloride 98 - 111 mmol/L 101 101 102  CO2 22 - 32 mmol/L 29 27 28   Calcium 8.9 - 10.3 mg/dL 9.0 8.8(L) 9.0     Assessment/Plan:  # AKI  - multifactorial with pre-renal and ischemic insults with sepsis and hypotension  - patient is unfortunately a poor dialysis candidate due to her functional status - Continue supportive care  - Normal saline at 75 ml/hr x 1 liter - 1/2 NS has been ordered at [facility] but has not been hanging - will discontinue to avoid confusion  # CKD stage III  - Baseline Cr 1.5 earlier this fall with Cr as high as 2 -2.5 from 08/2016 - 01/2017; previously seen by Dr. Florene Glen   # HTN  - acceptable control    # Acute on chronic systolic and diastolic CHF  - EF reported as  30%  - cautious hydration as above  - Currently in no acute distress and appears dry on exam  # Achalasia- s/p G-tube placement and on feeds  # Hypernatremia - improving with free water replacement   # Hx CVA  # Altered mental status - appreciate assistance of family and nursing with history - compounded by uremia and AKI  - gentle fluids as above   Claudia Desanctis, MD 01/28/2018 3:51 PM

## 2018-01-28 NOTE — Progress Notes (Signed)
MD paged:Please place clarification on nephro order per family request. NS infusion complete. Do you want to start .45% NS at 31ml/hr.

## 2018-01-29 LAB — BASIC METABOLIC PANEL
ANION GAP: 11 (ref 5–15)
BUN: 136 mg/dL — ABNORMAL HIGH (ref 8–23)
CO2: 28 mmol/L (ref 22–32)
Calcium: 9 mg/dL (ref 8.9–10.3)
Chloride: 103 mmol/L (ref 98–111)
Creatinine, Ser: 2.59 mg/dL — ABNORMAL HIGH (ref 0.44–1.00)
GFR calc non Af Amer: 16 mL/min — ABNORMAL LOW (ref >60)
GFR, EST AFRICAN AMERICAN: 18 mL/min — AB (ref >60)
Glucose, Bld: 140 mg/dL — ABNORMAL HIGH (ref 70–99)
POTASSIUM: 4.7 mmol/L (ref 3.5–5.1)
Sodium: 142 mmol/L (ref 135–145)

## 2018-01-29 LAB — GLUCOSE, CAPILLARY
Glucose-Capillary: 117 mg/dL — ABNORMAL HIGH (ref 70–99)
Glucose-Capillary: 136 mg/dL — ABNORMAL HIGH (ref 70–99)
Glucose-Capillary: 138 mg/dL — ABNORMAL HIGH (ref 70–99)
Glucose-Capillary: 159 mg/dL — ABNORMAL HIGH (ref 70–99)
Glucose-Capillary: 172 mg/dL — ABNORMAL HIGH (ref 70–99)
Glucose-Capillary: 95 mg/dL (ref 70–99)

## 2018-01-29 MED ORDER — LEVALBUTEROL HCL 0.63 MG/3ML IN NEBU
0.6300 mg | INHALATION_SOLUTION | Freq: Four times a day (QID) | RESPIRATORY_TRACT | Status: DC | PRN
  Administered 2018-02-01: 0.63 mg via RESPIRATORY_TRACT
  Filled 2018-01-29: qty 3

## 2018-01-29 MED ORDER — SODIUM CHLORIDE 0.9 % IV SOLN
INTRAVENOUS | Status: AC
  Administered 2018-01-29: 11:00:00 via INTRAVENOUS

## 2018-01-29 MED ORDER — IPRATROPIUM BROMIDE 0.02 % IN SOLN
0.5000 mg | Freq: Four times a day (QID) | RESPIRATORY_TRACT | Status: DC | PRN
  Administered 2018-02-20: 0.5 mg via RESPIRATORY_TRACT
  Filled 2018-01-29: qty 2.5

## 2018-01-29 MED ORDER — JEVITY 1.2 CAL PO LIQD
1000.0000 mL | ORAL | Status: DC
  Administered 2018-01-29: 1000 mL
  Filled 2018-01-29 (×2): qty 1000

## 2018-01-29 MED ORDER — VITAL AF 1.2 CAL PO LIQD
1000.0000 mL | ORAL | Status: DC
  Filled 2018-01-29: qty 1000

## 2018-01-29 NOTE — Progress Notes (Signed)
Son insisited IV fluids be discontinued. He is concerned about fluid overload and secondary respiratory distress. IVF fluids stopped, MD notified. Will continue to monitor.

## 2018-01-29 NOTE — Progress Notes (Signed)
Nutrition Follow-up  DOCUMENTATION CODES:   Non-severe (moderate) malnutrition in context of chronic illness, Underweight  INTERVENTION:    Initiate Jevity 1.2 at 25 ml/hr and increase by 10 ml every 6 hrs to goal rate of 55 ml/hr  Provides 1584 kcals, 73 gm protein, and 1065 ml free water daily   Liquid MVI daily via tube   D/C Vital AF 1.2  NUTRITION DIAGNOSIS:   Moderate Malnutrition related to chronic illness(dysphagia 2/2 to achalasia) as evidenced by energy intake < or equal to 75% for > or equal to 1 month, mild fat depletion, moderate fat depletion, mild muscle depletion, moderate muscle depletion, energy intake < or equal to 50% for > or equal to 1 month, ongoing  GOAL:   Patient will meet greater than or equal to 90% of their needs, met  MONITOR:   TF tolerance, Labs, Skin, Weight trends, I & O's  ASSESSMENT:   [name] is a 84 y.o. female with medical history significant for DM2, HTN, HLD, CAD s/p CABG, h/o CVA, CKD3, who was recently diagnosed with achalasia after a 50 lb weight loss over the past year. She was treated with botox earlier in October and sent to SNF with the hopes she would be able to swallow her food. She has not done well since that time, has only been able to drink small amounts of milk. Son is a Management consultant in [place] and has been visiting. Yesterday he noticed she was stuttering, having difficulty getting words out, and progressed until she seemed to have total aphasia last night. She does report having dysuria and some lower abd pain. No N/V. Has a good appetite but avoids food because of the obstruction. Megace and remeron have not helped. Process of being worked up for G tube has been initiated and pt reportedly has appt with [facility] tomorrow morning. Son feels like her weight loss and FTT could be reversible if she gets a G tube as she was reportedly very functional a month ago, walking with a walker and performing all of her ADLs.    11/7 - per IR notes, imaging revealed moderate sized hiatal hernia and colon in upper abdomen, recommending evaluating anatomy with fluoroscopy 11/8 - PICC placed 11/10 - TPN started, IR placed G-tube 11/11 - G-tube cleared for use by RI, TF initiated 11/13 - TPN d/c 11/20 - transitioned to bolus feedings 11/23 - s/p BSE, recommend continued NPO due to AMS, emesis with bolus feedings, rapid response called 11/25 - rapid response, tube feeds held and restarted in the afternoon at 20 ml/hr 11/27 - per MD, tube feeds at 35 ml/hr during the daytime and 30 ml/hr overnight 12/1 - rapid response for increased WOB 12/2 - emesis, TF turned off, intubated 12/6 extubated, BiPAP at night  Pt discussed in rounds this AM. Pt's son, Dr. Mayer Masker, requested to speak with RD re: TF regimen. This RD spoke with Dr. Mayer Masker via telephone [phone].  Dr. Mayer Masker is concerned pt's BUN is rising despite fluids. He believes pt may be receiving too much protein. He feels pt would benefit from change in TF formula.  Will D/C Vital AF 1.2 formula and initiate Jevity 1.2 formula. RD relayed nutrition care plan to pt's daughter, Jeani Hawking. Spoke with Lyndee Leo, RN as well.  Diet Order:   Diet Order            Diet NPO time specified  Diet effective now  EDUCATION NEEDS:   Not appropriate for education at this time  Skin:  Skin Assessment: Skin Integrity Issues: Skin Integrity Issues:: Stage II Stage II: left buttocks, sacrum Other: partial thickness wounds to bilateral buttocks (likely from adhesive)  Last BM:  12/30    Intake/Output Summary (Last 24 hours) at 01/29/2018 1157 Last data filed at 01/29/2018 0659 Gross per 24 hour  Intake 2706.16 ml  Output 400 ml  Net 2306.16 ml   Height:   Ht Readings from Last 1 Encounters:  12/20/17 5' 3"  (1.6 m)   Weight:   Wt Readings from Last 1 Encounters:  01/27/18 61.8 kg   Ideal Body Weight:  52.3 kg  BMI:  Body mass index is 24.13  kg/m.  Estimated Nutritional Needs:   Kcal:  1450-1650  Protein:  75-90 gm  Fluid:  >/= 1.5 L  Arthur Holms, RD, LDN Pager #: [phone] After-Hours Pager #: [phone]

## 2018-01-29 NOTE — Progress Notes (Signed)
[facility] Progress Note  Name: [name] MRN: [id number] DOB: 1927-07-09  Subjective:  She had 1 liter IV fluids infused overnight. Patient has had 400 mL UOP over 12/29 charted.  Spoke with the patient's daughter in the room as well as the patient's son via phone.  They agree with repeating fluids today and specifically we discussed infusing 1 liter at a rate of 75 ml/hr and checking a bladder scan.  Her daughter hasn't noticed any trouble with the patient's breathing.  Review of systems:  Unable to obtain 2/2 AMS ------------------------- Background on re-consult:  Patient previously seen by Dr. Hollie Salk earlier in the hospitalization; last evaluated 12/5.  She was felt to have AKI on CKD at that time which was felt multifactorial secondary to sepsis CHF.  She was characterized as a poor dialysis candidate given her functional status.  Now with progressive rise in Cr/BUN and declining urine output.  She had 300 mL uop over 12/26 but did have 4 unquantified voids.  She is s/p lasix yesterday. She has had a prolonged hospitalization complicated by asp PNA and UTI and CVA.  Renal US today with 10.4 and 9.4 cm kidneys; no hydro.  Spoke with her son via phone who agrees that this is likely pre-renal and agrees with gentle IV fluids - 250 mL NS.   Intake/Output Summary (Last 24 hours) at 01/29/2018 1497 Last data filed at 01/29/2018 0263 Gross per 24 hour  Intake 2706.16 ml  Output 400 ml  Net 2306.16 ml    Vitals:  Vitals:   01/28/18 1627 01/28/18 2214 01/28/18 2318 01/29/18 0723  BP: (!) 123/58 139/64  (!) 147/64  Pulse: 70 70 70 81  Resp: 12 20 18 14   Temp: 97.7 F (36.5 C) 97.9 F (36.6 C)  97.8 F (36.6 C)  TempSrc: Oral Oral  Oral  SpO2: 100% 100% 99% 100%  Weight:      Height:         Physical Exam:  General elderly female in bed chronically ill   HEENT normocephalic atraumatic extraocular movements intact sclera anicteric Neck supple trachea  midline Lungs clear to auscultation bilaterally normal work of breathing at rest  Heart regular rate and rhythm no rubs or gallops appreciated Abdomen soft nontender nondistended; PEG tube in place Neuro - disoriented and does not follow commands; calls out today Extremities trace lower extremity edema with decreased skin turgor Psych patient with agitation   Medications reviewed   Labs:  BMP Latest Ref Rng & Units 01/29/2018 01/28/2018 01/27/2018  Glucose 70 - 99 mg/dL 140(H) 128(H) 170(H)  BUN 8 - 23 mg/dL 136(H) 137(H) 140(H)  Creatinine 0.44 - 1.00 mg/dL 2.59(H) 2.66(H) 2.70(H)  Sodium 135 - 145 mmol/L 142 142 141  Potassium 3.5 - 5.1 mmol/L 4.7 4.7 4.6  Chloride 98 - 111 mmol/L 103 101 101  CO2 22 - 32 mmol/L 28 29 27   Calcium 8.9 - 10.3 mg/dL 9.0 9.0 8.8(L)     Assessment/Plan:  # AKI  - multifactorial with pre-renal and ischemic insults with sepsis and hypotension  - patient is unfortunately a poor dialysis candidate due to her functional status - Continue supportive care  - Normal saline at 75 ml/hr x 1 liter  - Check post-void residual bladder scan to r/o retention   # CKD stage III  - Baseline Cr 1.5 earlier this fall with Cr as high as 2 -2.5 from 08/2016 - 01/2017; previously seen by Dr. Florene Glen   # HTN  -  acceptable control   - hypotension is resolved   # Acute on chronic systolic and diastolic CHF  - EF reported as 30%  - cautious hydration as above  - Currently in no acute distress and appears dry on exam   # Achalasia- s/p G-tube placement and on feeds  # Hypernatremia - resolved; on free water replacement  # Hx CVA  # Altered mental status - appreciate assistance of family and nursing with history - compounded by uremia and AKI  - gentle fluids as above   Darlene Desanctis, MD 01/29/2018 9:22 AM

## 2018-01-29 NOTE — Progress Notes (Signed)
PROGRESS NOTE    [name]  YTK:[id number] DOB: 06-17-1927 DOA: 12/05/2017 PCP: Deland Pretty, MD   Brief Narrative: Pt with PMH of diabetes, hypertension, hyperlipidemia, CAD status post CABG, history of CVA, CKD stage III who was recently diagnosed with achalasia and was being treated with Botox with no significant improvement.  She was admitted with altered mental status and was found to have difficulty speaking and finding words which then progressed to total aphasia patient has been hospitalized she had prolonged hospital course.  Events: 11/5 admit 11/10 G tube placed by IR 11/13 PMT consulted 11/20 neuro consult. Rapid response for respiratory distress, treated with lasix. 11/21 cardiology consult for NSTEMI. 11/23 rapid response for respiratory distress, treated with lasix and morphine 11/27 ENT consulted for stridor, flexible laryngoscopy negative 12/1 rapid response for respiratory distress, treated with morphine 12/2 PCCM consulted for agonal respirations, transferred to ICU for intubation 12/3 Stable right atelectasis and effusion 12/6 Extubated " Currently patient is remains bedbound, lethargic, intermittently agitated.  Palliative care had seen the patient, but family wanted to pursue aggressive measures. Patient was transfused 1 unit of PRBC for chronic anemia 12/17.  As per family request patient was placed on Seroquel to help with agitation 12/17. Patient was transferred back to progressive unit 12/18.    Assessment & Plan:  Acute kidney injury on CKD stage III  Baseline creatinine about 11 months ago was in 2.0.  Patient's creatinine has been slowly climbing.  Renal ultrasound done on 12/27 does not show any intrinsic renal disease.  No hydronephrosis.  This appears to be mostly due to cardiorenal perhaps with an element of hypovolemia.  Patient was given a dose of Lasix on 12/26 as she was noted to have weight gain.  However her renal function worsened with BUN  increasing.  Nephrology was consulted.  They felt that patient was hypovolemic.  She was given fluid boluses with improvement in renal function but not significantly so.  Nephrology continues to follow.  BUN is stable.  Creatinine slightly better today.    Acute on chronic combined systolic and diastolic CHF with EF 75%: Essential hypertension Cardiac status appears to be stable.  Not on diuretics as patient has thought to be hypovolemic. BiDil was discontinued due to hypotension.  No ACE inhibitor or ARB due to renal failure.  Patient is on beta-blockers.  Blood pressure is stable.    Hypernatremia Sodium level worsened while the patient was undergoing treatment for CHF.  Was noted to have free water deficit.  Patient was given free water through PEG tube.  G urine level has improved.  Acute encephalopathy due to multiple comorbidities Mentation remained stable.  She remains agitated and confused..   Seroquel was discontinued per family request.  Donepezil was also discontinued.  Uremia is likely contributing.  EEG was unremarkable.     Acute hypoxic respiratory failure with ESBL Klebsiella aspiration pneumonia and pulmonary edema.   She remains stable from a respiratory standpoint.  Continue aspiration precautions.  She is on tube feedings.   Aspiration pneumonia  Secondary to achalasia. Patient completed antibiotic therapy.  Abnormal LFTs LFTs were normal when last checked on 12/23.  Left temporal and frontal punctuate CVA Patient to continue statin and Plavix as per neurology.  Dopplers nondiagnostic as patient was uncooperative and TTE without evidence of vegetation/clot.  Enterococcus UTI Patient has completed course of antibiotics.     Non-ST elevation MI Treated medically. Continue Plavix and statin, metoprolol. Called by nursing that the  patient was complaining of chest pain.  EKG shows similar changes as before.  Patient did have non-ST elevation MI during this hospitalization  and was seen by cardiology.  Medical management.  Treat this symptomatically for now.  Vital signs are stable.  Anemia of chronic diseasase Patient given 1 unit of PRBC on 12/17.  Hemoglobin has been low but stable.  Patient's prophylactic anticoagulation has been discontinued as per son's request. He was notified about the risk of VTE.    Moderate protein calorie moderation/oropharyngeal dysphagia Continue tube feeding. Dietitian on board.  Type 2 diabetes mellitus, uncontrolled with hyper and hypoglycemia. With renal complication. CBGs are reasonably well controlled.  Continue to monitor.  Continue Lantus and SSI.  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 Continue symptomatic management, local spray and monitor.  Constipation/Fecal impaction This has been relieved with the suppositories.  Disimpaction as needed.  Tardive dyskinesia Improved with Cogentin.    DVT prophylaxis: Subcutaneous heparin was discontinued per family request Code Status: FULL Family Communication: Discussed with patient's son and daughter.   Disposition Plan: Patient remains encephalopathic.  BUN and creatinine stable this morning.  Consultants: PCM, palliative care.  Nephrology  Antimicrobials: completed abx.  Subjective: Patient remains confused.  Does not answer appropriately to questions.  Objective: Vitals:   01/28/18 2214 01/28/18 2318 01/29/18 0723 01/29/18 1145  BP: 139/64  (!) 147/64 (!) 151/77  Pulse: 70 70 81 74  Resp: 20 18 14    Temp: 97.9 F (36.6 C)  97.8 F (36.6 C)   TempSrc: Oral  Oral   SpO2: 100% 99% 100%   Weight:      Height:        Intake/Output Summary (Last 24 hours) at 01/29/2018 1225 Last data filed at 01/29/2018 0659 Gross per 24 hour  Intake 2469.16 ml  Output 400 ml  Net 2069.16 ml   Filed Weights   01/25/18 0500 01/26/18 0500 01/27/18 0459  Weight: 60 kg 62.2 kg 61.8 kg   Weight change:   Body mass index is 24.13  kg/m.  Examination: General appearance: Confused.  Agitated at times.  In no distress Resp: Clear to auscultation bilaterally.  Normal effort Cardio: S1-S2 is normal regular.  No S3-S4.  No rubs murmurs or bruit GI: Abdomen is soft.  Nontender nondistended.  PEG tube is noted.  Bowel sounds are present normal.  No masses organomegaly Neurologic: Confused.  Agitated at times.  Moving all her extremities.   Data Reviewed: I have personally reviewed following labs and imaging studies  CBC: Recent Labs  Lab 01/23/18 0339 01/23/18 1828 01/24/18 0440 01/26/18 0639  WBC 7.2 6.9 7.9 6.5  HGB 7.5* 7.3* 7.7* 7.4*  HCT 25.7* 25.2* 25.6* 25.7*  MCV 101.2* 102.4* 100.4* 100.4*  PLT 155 157 164 409   Basic Metabolic Panel: Recent Labs  Lab 01/25/18 0700 01/26/18 0639 01/27/18 0531 01/28/18 0413 01/29/18 0550  NA 146* 143 141 142 142  K 4.4 4.7 4.6 4.7 4.7  CL 104 102 101 101 103  CO2 29 28 27 29 28   GLUCOSE 122* 179* 170* 128* 140*  BUN 123* 134* 140* 137* 136*  CREATININE 2.59* 2.70* 2.70* 2.66* 2.59*  CALCIUM 9.1 9.0 8.8* 9.0 9.0   GFR: Estimated Creatinine Clearance: 12.4 mL/min (A) (by C-G formula based on SCr of 2.59 mg/dL (H)). CBG: Recent Labs  Lab 01/28/18 2002 01/29/18 0013 01/29/18 0405 01/29/18 0725 01/29/18 1145  GLUCAP 155* 172* 138* 117* 159*  Radiology Studies: No results found.   Scheduled Meds: . atorvastatin  40 mg Per Tube q1800  . benztropine  0.5 mg Per Tube BID  . chlorhexidine  15 mL Mouth Rinse BID  . cholecalciferol  400 Units Per Tube Daily  . clopidogrel  75 mg Per Tube Daily  . collagenase   Topical Daily  . docusate  100 mg Per Tube Daily  . ferrous sulfate  300 mg Per Tube Daily  . free water  250 mL Per Tube Q8H  . insulin aspart  0-15 Units Subcutaneous Q4H  . insulin detemir  12 Units Subcutaneous Daily  . ipratropium  0.5 mg Nebulization BID   And  . levalbuterol  0.63 mg Nebulization BID  . mouth rinse  15 mL Mouth  Rinse q12n4p  . metoprolol tartrate  25 mg Per Tube BID  . multivitamin  15 mL Per Tube Daily  . pantoprazole sodium  40 mg Per Tube Q1200   Continuous Infusions: . sodium chloride Stopped (01/08/18 2158)  . sodium chloride 75 mL/hr at 01/29/18 1047  . feeding supplement (JEVITY 1.2 CAL)       LOS: 51 days     Bonnielee Haff, MD [facility]  If 7PM-7AM, please contact night-coverage [email or web address] Password [email or web address] 01/29/2018, 12:25 PM

## 2018-01-30 DIAGNOSIS — J690 Pneumonitis due to inhalation of food and vomit: Secondary | ICD-10-CM

## 2018-01-30 LAB — GLUCOSE, CAPILLARY
Glucose-Capillary: 110 mg/dL — ABNORMAL HIGH (ref 70–99)
Glucose-Capillary: 135 mg/dL — ABNORMAL HIGH (ref 70–99)
Glucose-Capillary: 154 mg/dL — ABNORMAL HIGH (ref 70–99)
Glucose-Capillary: 165 mg/dL — ABNORMAL HIGH (ref 70–99)
Glucose-Capillary: 188 mg/dL — ABNORMAL HIGH (ref 70–99)
Glucose-Capillary: 211 mg/dL — ABNORMAL HIGH (ref 70–99)

## 2018-01-30 LAB — BASIC METABOLIC PANEL
Anion gap: 10 (ref 5–15)
BUN: 137 mg/dL — ABNORMAL HIGH (ref 8–23)
CO2: 28 mmol/L (ref 22–32)
Calcium: 8.9 mg/dL (ref 8.9–10.3)
Chloride: 104 mmol/L (ref 98–111)
Creatinine, Ser: 2.7 mg/dL — ABNORMAL HIGH (ref 0.44–1.00)
GFR calc Af Amer: 18 mL/min — ABNORMAL LOW (ref >60)
GFR calc non Af Amer: 15 mL/min — ABNORMAL LOW (ref >60)
Glucose, Bld: 144 mg/dL — ABNORMAL HIGH (ref 70–99)
Potassium: 5 mmol/L (ref 3.5–5.1)
Sodium: 142 mmol/L (ref 135–145)

## 2018-01-30 MED ORDER — JEVITY 1.2 CAL PO LIQD
1000.0000 mL | ORAL | Status: DC
  Administered 2018-01-30: 17:00:00
  Administered 2018-01-31 – 2018-02-13 (×14): 1000 mL
  Filled 2018-01-30 (×18): qty 1000

## 2018-01-30 MED ORDER — FAMOTIDINE 20 MG PO TABS
10.0000 mg | ORAL_TABLET | Freq: Every day | ORAL | Status: DC
  Administered 2018-01-30 – 2018-02-26 (×28): 10 mg via ORAL
  Filled 2018-01-30 (×28): qty 1

## 2018-01-30 NOTE — Progress Notes (Signed)
  Speech Language Pathology Treatment: Dysphagia  Patient Details Name: [name] MRN: [id number] DOB: 1927-04-12 Today's Date: 01/30/2018 Time: 7290-2111 SLP Time Calculation (min) (ACUTE ONLY): 12 min  Assessment / Plan / Recommendation Clinical Impression  Pt was alert this afternoon but still needing Mod cues for oral acceptance of POs (close your lips, take a sip, etc.). She does not have overt coughing but today she is loudly moaning after every sip. Unclear where this is coming from as it is new behavior, pt cannot articulate, and it stops when PO trials are stopped. Therefore, limited trials were offered. Would continue current plan of care for now, as pt remains at risk for prandial and post-prandial aspiration given cognitive and esophageal issues.    HPI HPI: 84 y.o. female with CAD s/p CABG, recent ischemic stroke, HTN, DM, HLP, recent failure to thrive due to swallowing problems s/p botox injection for achalasia, developed acute pulmonary edema and NSTEMI on 12/20/2017. Bedside swallow on 11/05/17 revealed functional oropharyngeal swallow and suspected esophageal dysphagia. Had esophagram 11/06/17 which showed moderately large hiatal hernia with poor motility and retained barium throughout the esophagus. PEG 12/10/17.  Repeat bedside swallow eval 12/23/17 with recs for NPO secondary to poor MS, no oral anticipation/recognition of POs. Pt with no change in MS that would allow safe oral intake between 11/23 and date of intubation,12/2.  Extubated 12/6. New orders for repeat bedside swallow eval s/p extubation.      SLP Plan  Continue with current plan of care       Recommendations  Diet recommendations: NPO Medication Administration: Via alternative means                Oral Care Recommendations: Oral care QID Follow up Recommendations: Skilled Nursing facility SLP Visit Diagnosis: Dysphagia, unspecified (R13.10) Plan: Continue with current plan of care        GO                [name] 01/30/2018, 5:14 PM  [name], M.A. [employer] Acute Environmental education officer [phone] Office [phone]

## 2018-01-30 NOTE — Progress Notes (Signed)
[facility] Progress Note  Name: [name] MRN: [id number] DOB: 11/20/1927  Subjective:  Patient's fluids were discontinued given her family's concern over developing respiratory distress.  She has been comfortable this AM.  Per nursing she had a bladder scan with 300 mL and had in/out cath x 1.  I spoke with Dr. Mayer [name] via phone and they are ok with inserting a foley catheter for urinary retention.  We also discussed changing her PPI to famotidine and he is ok with this.  He states that he has spoken with nutrition regarding changing her tube feeds to a lower protein content after communicating with an [employer] of a medical paper.  Updated her daughter at bedside as well.   Review of systems:  Unable to obtain 2/2 AMS ------------------------- Background on re-consult:  Patient previously seen by Dr. Hollie [name] earlier in the hospitalization; last evaluated 12/5.  She was felt to have AKI on CKD at that time which was felt multifactorial secondary to sepsis CHF.  She was characterized as a poor dialysis candidate given her functional status.  Now with progressive rise in Cr/BUN and declining urine output.  She had 300 mL uop over 12/26 but did have 4 unquantified voids.  She is s/p lasix yesterday. She has had a prolonged hospitalization complicated by asp PNA and UTI and CVA.  Renal US today with 10.4 and 9.4 cm kidneys; no hydro.  Spoke with her son via phone who agrees that this is likely pre-renal and agrees with gentle IV fluids - 250 mL NS.   Intake/Output Summary (Last 24 hours) at 01/30/2018 0955 Last data filed at 01/30/2018 0659 Gross per 24 hour  Intake 1891.16 ml  Output 775 ml  Net 1116.16 ml    Vitals:  Vitals:   01/30/18 0015 01/30/18 0819 01/30/18 0825 01/30/18 0827  BP: 124/68 104/88 139/65 (!) 132/58  Pulse: 60 66 64 64  Resp: 16 11    Temp: (!) 97.1 F (36.2 C) 97.9 F (36.6 C)    TempSrc: Oral     SpO2: 100% 100%    Weight:      Height:          Physical Exam:  General elderly female in bed chronically ill   HEENT normocephalic atraumatic extraocular movements intact sclera anicteric Neck supple trachea midline Lungs clear and unlabored Heart regular rate and rhythm no rubs or gallops appreciated Abdomen soft nontender nondistended; PEG tube in place Neuro - disoriented but calm Extremities no lower extremity edema Psych no agitation or anxiety  Medications reviewed   Labs:  BMP Latest Ref Rng & Units 01/30/2018 01/29/2018 01/28/2018  Glucose 70 - 99 mg/dL 144(H) 140(H) 128(H)  BUN 8 - 23 mg/dL 137(H) 136(H) 137(H)  Creatinine 0.44 - 1.00 mg/dL 2.70(H) 2.59(H) 2.66(H)  Sodium 135 - 145 mmol/L 142 142 142  Potassium 3.5 - 5.1 mmol/L 5.0 4.7 4.7  Chloride 98 - 111 mmol/L 104 103 101  CO2 22 - 32 mmol/L 28 28 29   Calcium 8.9 - 10.3 mg/dL 8.9 9.0 9.0     Assessment/Plan:  # AKI - multifactorial with pre-renal and ischemic insults with sepsis and hypotension.  Also with retention noted on bladder scan.  Patient is unfortunately a poor dialysis candidate due to her functional status - Continue supportive care. S/p cautious hydration given hx of CHF.  - Place foley  - Changing PPI to famotidine - Defer additional IV fluids for today  # CKD stage III  - Baseline  Cr 1.5 earlier this fall with Cr as high as 2 -2.5 from 08/2016 - 01/2017; previously seen by Dr. Florene [name]   # HTN   - acceptable control   - hypotension is resolved   # Acute on chronic systolic and diastolic CHF  - EF reported as 30%  - s/p cautious hydration  # Achalasia- s/p G-tube placement and on feeds  # Hypernatremia - resolved; on free water replacement  # Hx CVA  # Altered mental status - appreciate assistance of family and nursing with history - compounded by uremia and AKI   Discussed with nursing.  Darlene Desanctis, MD 01/30/2018 9:55 AM

## 2018-01-30 NOTE — Progress Notes (Signed)
Physical Therapy Treatment Patient Details Name: [name] MRN: [id number] DOB: 12-23-1927 Today's Date: 01/30/2018    History of Present Illness 84 yo female admitted on 12/05/17 with onset of UTI and AMS has ongoing weight loss and dehydration with FTT.  S/p G-tube placement on 12/10/17 with ongoing confusion.  Dx with acute respiratory failure with hypoxia due to pulmonary edema, NSTEMI possibly due to volume overload 11/21, acute HF, acute metabolic encepholopathy (per neurology: 3 punctate left temporal and left frontal infarcts, etiology unclear,  but will not explain pt encephalopathy), aspiration PNA 11/23, anemia, sacral pressure wound.  Respiratory distress on 11/25 with pt combative vs. lethargy.  VDRF 12/2-12/6.  On Bipap on 12/7.  Decreased alertness on 12/31.  PMHx:  CAD, AKI, angina, cardiomegaly, CABG, CVA with L hemi, CKD 3, transfusion, pleural effusion, DM, achalasia with botox procedure    PT Comments    Pt was seen for mobility after obtaining permission from nursing given pt's fragile health.  ROM to legs with attempt to get pt to assist were done, minor assistance from pt was given.  Pt is more settled and calm after ROM is done with placement of boots to protect her heels.  Will follow her as pt is able to benefit from PT intervention to her care.   Follow Up Recommendations  SNF;Supervision/Assistance - 24 hour     Equipment Recommendations  Hospital bed;Other (comment)(hoyer lift)    Recommendations for Other Services       Precautions / Restrictions Precautions Precautions: Fall Precaution Comments: coccygeal wound, Prevalon boots/ Peg 11/10 with abdominal binder, geo mat, B hand mitten Restrictions Weight Bearing Restrictions: No    Mobility  Bed Mobility Overal bed mobility: Needs Assistance             General bed mobility comments: total assist to position head and shoulders to midline  Transfers                 General transfer  comment: pt is not alert enough to attempt  Ambulation/Gait                 Stairs             Wheelchair Mobility    Modified Rankin (Stroke Patients Only)       Balance                                            Cognition Arousal/Alertness: Lethargic Behavior During Therapy: Flat affect;Restless Overall Cognitive Status: Impaired/Different from baseline Area of Impairment: Orientation;Attention;Memory;Following commands;Safety/judgement;Awareness;Problem solving                 Orientation Level: Place;Time;Situation Current Attention Level: Selective Memory: Decreased recall of precautions;Decreased short-term memory Following Commands: Follows one step commands with increased time;Follows one step commands inconsistently Safety/Judgement: Decreased awareness of safety;Decreased awareness of deficits Awareness: Intellectual Problem Solving: Slow processing;Decreased initiation;Requires verbal cues;Requires tactile cues General Comments: pt is not initiating any movement      Exercises General Exercises - Lower Extremity Ankle Circles/Pumps: PROM;Both;5 reps Gluteal Sets: AROM;Both;5 reps Heel Slides: AAROM;Both;10 reps Hip ABduction/ADduction: AAROM;Both;10 reps Hip Flexion/Marching: PROM;Both;10 reps    General Comments        Pertinent Vitals/Pain Pain Assessment: Faces Faces Pain Scale: Hurts little more Pain Location: movement of R hip increased yelling out Pain Descriptors / Indicators: Restless;Moaning Pain Intervention(s):  Limited activity within patient's tolerance;Monitored during session;Repositioned    Home Living                      Prior Function            PT Goals (current goals can now be found in the care plan section) Acute Rehab PT Goals Patient Stated Goal: none stated PT Goal Formulation: Patient unable to participate in goal setting Progress towards PT goals: Not progressing toward  goals - comment    Frequency    Min 2X/week      PT Plan Current plan remains appropriate    Co-evaluation              AM-PAC PT "6 Clicks" Mobility   Outcome Measure  Help needed turning from your back to your side while in a flat bed without using bedrails?: Total Help needed moving from lying on your back to sitting on the side of a flat bed without using bedrails?: Total Help needed moving to and from a bed to a chair (including a wheelchair)?: Total Help needed standing up from a chair using your arms (e.g., wheelchair or bedside chair)?: Total Help needed to walk in hospital room?: Total Help needed climbing 3-5 steps with a railing? : Total 6 Click Score: 6    End of Session Equipment Utilized During Treatment: Other (comment);Oxygen(soft positioning boots to protect heels) Activity Tolerance: Other (comment);Patient limited by pain(cognition) Patient left: in bed;with call bell/phone within reach;with bed alarm set Nurse Communication: Mobility status PT Visit Diagnosis: Muscle weakness (generalized) (M62.81);Other abnormalities of gait and mobility (R26.89);Adult, failure to thrive (R62.7);Hemiplegia and hemiparesis Hemiplegia - Right/Left: Left Hemiplegia - dominant/non-dominant: Non-dominant Hemiplegia - caused by: Unspecified     Time: 9030-0923 PT Time Calculation (min) (ACUTE ONLY): 14 min  Charges:  $Therapeutic Exercise: 8-22 mins                     Ramond Dial 01/30/2018, 3:30 PM  Mee Hives, PT MS Acute Rehab Dept. Number: [place] and [place]

## 2018-01-30 NOTE — Progress Notes (Signed)
PROGRESS NOTE    [name]  XIP:[id number] DOB: [birth year] DOA: 12/05/2017 PCP: Deland Pretty, MD   Brief Narrative: [name] is a 84 y.o. female with a history of diabetes, hypertension, hyperlipidemia, CAD status post CABG, history of CVA, CKD stage III who was recently diagnosed with achalasia and was being treated with Botox with no significant improvement.  She was admitted with altered mental status and was found to have difficulty speaking and finding words which then progressed to total aphasia patient has been hospitalized she had prolonged hospital course significant for NSTEMI, respiratory failure requiring intubation, intermittent encephalopathy and anemia in addition to now having renal failure.   Assessment & Plan:   Principal Problem:   Dysarthria Active Problems:   Diabetes mellitus ([place])   Hyperlipidemia   Essential hypertension   Coronary atherosclerosis   Stroke (cerebrum) (HCC)   Stage 3 chronic kidney disease ([place])   Acute lower UTI   Adult failure to thrive   UTI (urinary tract infection)   DNR (do not resuscitate) discussion   Palliative care by specialist   CHF (congestive heart failure) ([place])   Aspiration pneumonia due to gastric secretions (HCC)   NSTEMI (non-ST elevated myocardial infarction) ([place])   Altered mental status   Acute on chronic respiratory failure with hypoxia (HCC)   Acute kidney injury on CKD stage III Elevated BUN and creatinine.  Contributing to confusion.  Nephrology is currently on board and was treating patient with IV fluids secondary to hypovolemia.  IV fluids currently stopped per family request.  BUN and creatinine have remained stable for the past few days. -Nephrology recommendations  Acute on chronic combined systolic and diastolic heart failure EF of 30%.  Patient was diuresed earlier in the hospitalization.  Now more on the hypovolemic status side as mentioned above.  Currently on an ACE or an arm secondary to renal  failure.  Patient currently on metoprolol twice daily. -Continue metoprolol  Hypernatremia Secondary to free water deficit.  This was resolved with increasing free water through PEG tube.  Acute metabolic encephalopathy Multiple comorbidities.  Currently likely attributed to by uremia.  Started on Seroquel secondary to confusion.  EEG obtained and was unremarkable.  Acute respiratory failure with hypoxia In setting of heart failure and ESBL Klebsiella aspiration pneumonia.  Currently resolved.  On room air.  ESBL Klebsiella aspiration pneumonia Completed antibiotic course.  Abnormal LFTs Transient rise. Resolved.  Non-ST elevation MI Seen by cardiology.  Recommendations for medical management only.  Currently on a beta-blocker.  Enterococcus UTI Completed antibiotic course.  Left temporal and frontal punctate CVA Evaluated by neurology.  No vegetation or mobile clot seen on TTE. -Continue Plavix and atorvastatin 40 mg  Moderate malnutrition Oropharyngeal dysphasia Currently on tube feeding through PEG.  Diabetes mellitus type 2 Uncontrolled with hyper and hypoglycemia.  Patient currently on tube feeds. -Continue Lantus and sliding scale insulin  Constipation Resolved.  Tardive dyskinesia Patient currently on Cogentin  Pressure injury Left buttocks, POA   DVT prophylaxis: SCDs Code Status:   Code Status: Full Code Family Communication: Daughter at bedside Disposition Plan: Discharge pending continued goals of care, improvement of kidney failure/uremia   Consultants:   Nephrology  Cardiology  PCCM  Palliative care  Procedures:   Will update  Antimicrobials:  Vancomycin IV (11/23>24; 11/26)  Zosyn IV (11/23>>11/30; 12/2>>12/3)  Meropenem IV (12/4>>12/10)   Subjective: Confused.  Objective: Vitals:   01/30/18 0819 01/30/18 0825 01/30/18 0827 01/30/18 1500  BP: 104/88 139/65 (!) 132/58  133/65  Pulse: 66 64 64 67  Resp: 11   12  Temp: 97.9  F (36.6 C)   97.9 F (36.6 C)  TempSrc:    Axillary  SpO2: 100%   100%  Weight:      Height:        Intake/Output Summary (Last 24 hours) at 01/30/2018 1734 Last data filed at 01/30/2018 1000 Gross per 24 hour  Intake 1446.07 ml  Output 475 ml  Net 971.07 ml   Filed Weights   01/25/18 0500 01/26/18 0500 01/27/18 0459  Weight: 60 kg 62.2 kg 61.8 kg    Examination:  General exam: Appears calm and comfortable Respiratory system: Clear to auscultation. Respiratory effort normal. Cardiovascular system: S1 & S2 heard, RRR.  Gastrointestinal system: Abdomen is nondistended, soft and nontender. No organomegaly or masses felt. Normal bowel sounds heard. Central nervous system: Arouses with voice. Not oriented. Extremities:  No calf tenderness Skin: No cyanosis. No rashes Psychiatry: Flat affect     Data Reviewed: I have personally reviewed following labs and imaging studies  CBC: Recent Labs  Lab 01/23/18 1828 01/24/18 0440 01/26/18 0639  WBC 6.9 7.9 6.5  HGB 7.3* 7.7* 7.4*  HCT 25.2* 25.6* 25.7*  MCV 102.4* 100.4* 100.4*  PLT 157 164 992   Basic Metabolic Panel: Recent Labs  Lab 01/26/18 0639 01/27/18 0531 01/28/18 0413 01/29/18 0550 01/30/18 0333  NA 143 141 142 142 142  K 4.7 4.6 4.7 4.7 5.0  CL 102 101 101 103 104  CO2 28 27 29 28 28   GLUCOSE 179* 170* 128* 140* 144*  BUN 134* 140* 137* 136* 137*  CREATININE 2.70* 2.70* 2.66* 2.59* 2.70*  CALCIUM 9.0 8.8* 9.0 9.0 8.9   GFR: Estimated Creatinine Clearance: 11.9 mL/min (A) (by C-G formula based on SCr of 2.7 mg/dL (H)). Liver Function Tests: No results for input(s): AST, ALT, ALKPHOS, BILITOT, PROT, ALBUMIN in the last 168 hours. No results for input(s): LIPASE, AMYLASE in the last 168 hours. No results for input(s): AMMONIA in the last 168 hours. Coagulation Profile: No results for input(s): INR, PROTIME in the last 168 hours. Cardiac Enzymes: No results for input(s): CKTOTAL, CKMB, CKMBINDEX,  TROPONINI in the last 168 hours. BNP (last 3 results) No results for input(s): PROBNP in the last 8760 hours. HbA1C: No results for input(s): HGBA1C in the last 72 hours. CBG: Recent Labs  Lab 01/30/18 0004 01/30/18 0318 01/30/18 0822 01/30/18 1134 01/30/18 1517  GLUCAP 110* 135* 154* 211* 188*   Lipid Profile: No results for input(s): CHOL, HDL, LDLCALC, TRIG, CHOLHDL, LDLDIRECT in the last 72 hours. Thyroid Function Tests: No results for input(s): TSH, T4TOTAL, FREET4, T3FREE, THYROIDAB in the last 72 hours. Anemia Panel: No results for input(s): VITAMINB12, FOLATE, FERRITIN, TIBC, IRON, RETICCTPCT in the last 72 hours. Sepsis Labs: No results for input(s): PROCALCITON, LATICACIDVEN in the last 168 hours.  No results found for this or any previous visit (from the past 240 hour(s)).       Radiology Studies: No results found.      Scheduled Meds: . atorvastatin  40 mg Per Tube q1800  . benztropine  0.5 mg Per Tube BID  . chlorhexidine  15 mL Mouth Rinse BID  . cholecalciferol  400 Units Per Tube Daily  . clopidogrel  75 mg Per Tube Daily  . collagenase   Topical Daily  . docusate  100 mg Per Tube Daily  . famotidine  10 mg Oral Daily  . feeding supplement (  JEVITY 1.2 CAL)  1,000 mL Per Tube Q24H  . ferrous sulfate  300 mg Per Tube Daily  . free water  250 mL Per Tube Q8H  . insulin aspart  0-15 Units Subcutaneous Q4H  . insulin detemir  12 Units Subcutaneous Daily  . mouth rinse  15 mL Mouth Rinse q12n4p  . metoprolol tartrate  25 mg Per Tube BID  . multivitamin  15 mL Per Tube Daily   Continuous Infusions: . sodium chloride Stopped (01/08/18 2158)     LOS: 52 days     Cordelia Poche, MD [facility] 01/30/2018, 5:34 PM  If 7PM-7AM, please contact night-coverage [email or web address]

## 2018-01-30 NOTE — Progress Notes (Signed)
Nutrition Brief Note  RD spoke with pt's son, Dr. Mayer Masker at bedside. Pt's current TF regimen is Jevity 1.2 at 55 ml/hr which is providing 1584 kcals, 73 gm protein, 1065 ml of free water daily. Dr. Mayer Masker feels the pt is still receiving too much protein given her BUN continuing to rise. Pt's son is requesting current TF rate to be decreased to 40 ml/hr; this will provide 1152 kcals, 53 gm protein, 775 ml of free water daily. Will adjust order; spoke with Lyndee Leo, RN.  Arthur Holms, RD, LDN Pager #: [phone] After-Hours Pager #: [phone]

## 2018-01-31 LAB — BASIC METABOLIC PANEL
Anion gap: 10 (ref 5–15)
BUN: 135 mg/dL — ABNORMAL HIGH (ref 8–23)
CO2: 30 mmol/L (ref 22–32)
Calcium: 9.2 mg/dL (ref 8.9–10.3)
Chloride: 101 mmol/L (ref 98–111)
Creatinine, Ser: 2.61 mg/dL — ABNORMAL HIGH (ref 0.44–1.00)
GFR calc non Af Amer: 16 mL/min — ABNORMAL LOW (ref >60)
GFR, EST AFRICAN AMERICAN: 18 mL/min — AB (ref >60)
Glucose, Bld: 154 mg/dL — ABNORMAL HIGH (ref 70–99)
Potassium: 5.1 mmol/L (ref 3.5–5.1)
SODIUM: 141 mmol/L (ref 135–145)

## 2018-01-31 LAB — CBC
HCT: 25.7 % — ABNORMAL LOW (ref 36.0–46.0)
Hemoglobin: 7.5 g/dL — ABNORMAL LOW (ref 12.0–15.0)
MCH: 29.1 pg (ref 26.0–34.0)
MCHC: 29.2 g/dL — ABNORMAL LOW (ref 30.0–36.0)
MCV: 99.6 fL (ref 80.0–100.0)
Platelets: 200 10*3/uL (ref 150–400)
RBC: 2.58 MIL/uL — ABNORMAL LOW (ref 3.87–5.11)
RDW: 17.3 % — ABNORMAL HIGH (ref 11.5–15.5)
WBC: 6.3 10*3/uL (ref 4.0–10.5)
nRBC: 0 % (ref 0.0–0.2)

## 2018-01-31 LAB — GLUCOSE, CAPILLARY
Glucose-Capillary: 164 mg/dL — ABNORMAL HIGH (ref 70–99)
Glucose-Capillary: 132 mg/dL — ABNORMAL HIGH (ref 70–99)
Glucose-Capillary: 138 mg/dL — ABNORMAL HIGH (ref 70–99)
Glucose-Capillary: 133 mg/dL — ABNORMAL HIGH (ref 70–99)
Glucose-Capillary: 91 mg/dL (ref 70–99)
Glucose-Capillary: 111 mg/dL — ABNORMAL HIGH (ref 70–99)

## 2018-01-31 NOTE — Progress Notes (Signed)
Per the [facility], tube feeding should have been D/C yesterday.  However, there is a note from 01/30/2018 stating the pt's son requested the tube feeding be decreased from 55 ml/hr to 72ml/hr.  Currently the tube feeding is still at 20ml/hr.    A page was sent to the attending MD for clarification.  Awaiting response.

## 2018-01-31 NOTE — Progress Notes (Signed)
[facility] Progress Note  Name: Maisey Deandrade MRN: [id number] DOB: 08/26/1927  Subjective:  Foley catheter inserted yesterday for retention 334mL.  UOP prior 24hrs (to 7am 1/1) documented 346mL with total I/Os 729/300 (+44mL).  Her IV fluids had been discontinued per family request the day prior.  Her TF have been decreased in volume and switched to lower protein formulation.   Cr 2.7 > 2.61.  BUN  137 > 135. Discussed with daughter who is bedside and Dr. Mayer Masker her son via telephone.   Review of systems:  Unable to obtain 2/2 AMS  ------------------------- Background on re-consult:  Patient previously seen by Dr. Hollie Salk earlier in the hospitalization; last evaluated 12/5.  She was felt to have AKI on CKD (BL ~1.5) at that time which was felt multifactorial secondary to sepsis CHF.  She was characterized as a poor dialysis candidate given her functional status.  Now with progressive rise in Cr/BUN and declining urine output.  She has had a prolonged hospitalization complicated by asp PNA and UTI and CVA.  Renal US 12/27 with 10.4 and 9.4 cm kidneys; no hydro.     Intake/Output Summary (Last 24 hours) at 01/31/2018 1019 Last data filed at 01/31/2018 0800 Gross per 24 hour  Intake 633.25 ml  Output 300 ml  Net 333.25 ml    Vitals:  Vitals:   01/30/18 2043 01/30/18 2343 01/31/18 0500 01/31/18 0753  BP: 114/68 127/74  (!) 130/58  Pulse: 72 66  71  Resp:  16    Temp: 97.9 F (36.6 C) 98.3 F (36.8 C)  98.4 F (36.9 C)  TempSrc: Oral Oral  Oral  SpO2: 100% 100%  92%  Weight:   67 kg   Height:         Physical Exam:  General elderly female in bed chronically ill   HEENT normocephalic atraumatic extraocular movements intact sclera anicteric Neck supple trachea midline Lungs clear and unlabored Heart regular rate and rhythm no rubs or gallops appreciated Abdomen soft nontender nondistended; PEG tube in place Neuro - disoriented but calm Extremities no lower extremity  edema Psych no agitation or anxiety  Medications reviewed   Labs:  BMP Latest Ref Rng & Units 01/31/2018 01/30/2018 01/29/2018  Glucose 70 - 99 mg/dL 154(H) 144(H) 140(H)  BUN 8 - 23 mg/dL 135(H) 137(H) 136(H)  Creatinine 0.44 - 1.00 mg/dL 2.61(H) 2.70(H) 2.59(H)  Sodium 135 - 145 mmol/L 141 142 142  Potassium 3.5 - 5.1 mmol/L 5.1 5.0 4.7  Chloride 98 - 111 mmol/L 101 104 103  CO2 22 - 32 mmol/L 30 28 28   Calcium 8.9 - 10.3 mg/dL 9.2 8.9 9.0     Assessment/Plan:  # AKI: stable with Cr 2.61 today from 2.7 yesterday; BUN remains in the 130s which is at least in part due to tube feeds. - multifactorial with pre-renal and ischemic insults with sepsis and hypotension.  Also with retention noted on bladder scan.  Patient is unfortunately a poor dialysis candidate due to her functional status - Continue supportive care. S/p cautious hydration given hx of CHF.  - Placed foley yesterday due to large PVR - Changed PPI to famotidine 12/31 - Defer additional IV fluids for today  # CKD stage III  - Baseline Cr 1.5 earlier this fall with Cr as high as 2 -2.5 from 08/2016 - 01/2017; previously seen by Dr. Florene Glen   # HTN   - acceptable control on metoprolol 25 BID only - hypotension is resolved   #  Acute on chronic systolic and diastolic CHF  - EF reported as 30%  - caution with hydration  # Achalasia- s/p G-tube placement and on feeds  # Hypernatremia - resolved; on free water replacement  # Hx CVA  # Altered mental status - appreciate assistance of family and nursing with history. Certainly multifactorial.  Justin Mend, MD 01/31/2018 10:19 AM

## 2018-01-31 NOTE — Progress Notes (Addendum)
PROGRESS NOTE    [name]  XNT:[id number] DOB: 1927-09-05 DOA: 12/05/2017 PCP: Deland Pretty, MD   Brief Narrative: [name] is a 85 y.o. female with a history of diabetes, hypertension, hyperlipidemia, CAD status post CABG, history of CVA, CKD stage III who was recently diagnosed with achalasia and was being treated with Botox with no significant improvement.  She was admitted with altered mental status and was found to have difficulty speaking and finding words which then progressed to total aphasia patient has been hospitalized she had prolonged hospital course significant for NSTEMI, respiratory failure requiring intubation, intermittent encephalopathy and anemia in addition to now having renal failure.   Assessment & Plan:   Principal Problem:   Dysarthria Active Problems:   Diabetes mellitus ([place])   Hyperlipidemia   Essential hypertension   Coronary atherosclerosis   Stroke (cerebrum) (HCC)   Stage 3 chronic kidney disease ([place])   Acute lower UTI   Adult failure to thrive   UTI (urinary tract infection)   DNR (do not resuscitate) discussion   Palliative care by specialist   CHF (congestive heart failure) ([place])   Aspiration pneumonia due to gastric secretions (HCC)   NSTEMI (non-ST elevated myocardial infarction) ([place])   Altered mental status   Acute on chronic respiratory failure with hypoxia (HCC)   Acute kidney injury on CKD stage III Elevated BUN and creatinine.  Contributing to confusion.  Nephrology is currently on board and was treating patient with IV fluids secondary to hypovolemia.  IV fluids currently stopped per family request.  BUN and creatinine have remained stable for the past few days. Minimal decrease today. Tube feeds changed to a lower protein formula and decreased rate. -Nephrology recommendations  Acute on chronic combined systolic and diastolic heart failure EF of 30%.  Patient was diuresed earlier in the hospitalization.  Now more on the  hypovolemic status side as mentioned above.  Currently on an ACE or an arm secondary to renal failure.  Patient currently on metoprolol twice daily. -Continue metoprolol  Hypernatremia Secondary to free water deficit.  This was resolved with increasing free water through PEG tube.  Acute metabolic encephalopathy Multiple comorbidities.  Currently likely attributed to by uremia.  Started on Seroquel secondary to confusion.  EEG obtained and was unremarkable.  Acute respiratory failure with hypoxia In setting of heart failure and ESBL Klebsiella aspiration pneumonia.  Currently resolved.  On room air.  ESBL Klebsiella aspiration pneumonia Completed antibiotic course.  Abnormal LFTs Transient rise. Resolved.  Anemia Chronic disease per chart review. Patient was previously on heparin subcutaneous for DVT prophylaxis which has been discontinued. Recent FOBT from 01/22/18 negative. Hemoglobin has been stable. -Intermittent CBCs  Non-ST elevation MI Seen by cardiology.  Recommendations for medical management only.  Currently on a beta-blocker.  Enterococcus UTI Completed antibiotic course.  Left temporal and frontal punctate CVA Evaluated by neurology.  No vegetation or mobile clot seen on TTE. -Continue Plavix and atorvastatin 40 mg  Moderate malnutrition Oropharyngeal dysphasia Currently on tube feeding through PEG. Tube feeds switched to Jevity 1.2 cal with rate slowed to 40 ml/hr  Diabetes mellitus type 2 Uncontrolled with hyper and hypoglycemia.  Patient currently on tube feeds. -Continue Lantus and sliding scale insulin  Constipation Resolved.  Tardive dyskinesia Patient currently on Cogentin  Pressure injury Left buttocks, POA   DVT prophylaxis: SCDs Code Status:   Code Status: Full Code Family Communication: Daughter at bedside Disposition Plan: Discharge pending continued goals of care, improvement of kidney failure/uremia  Consultants:    Nephrology  Cardiology  PCCM  Palliative care  Procedures:   Will update  Antimicrobials:  Vancomycin IV (11/23>24; 11/26)  Zosyn IV (11/23>>11/30; 12/2>>12/3)  Meropenem IV (12/4>>12/10)   Subjective: Feels nauseated.  Objective: Vitals:   01/30/18 2043 01/30/18 2343 01/31/18 0500 01/31/18 0753  BP: 114/68 127/74  (!) 130/58  Pulse: 72 66  71  Resp:  16    Temp: 97.9 F (36.6 C) 98.3 F (36.8 C)  98.4 F (36.9 C)  TempSrc: Oral Oral  Oral  SpO2: 100% 100%  92%  Weight:   67 kg   Height:        Intake/Output Summary (Last 24 hours) at 01/31/2018 1147 Last data filed at 01/31/2018 0800 Gross per 24 hour  Intake 633.25 ml  Output 300 ml  Net 333.25 ml   Filed Weights   01/26/18 0500 01/27/18 0459 01/31/18 0500  Weight: 62.2 kg 61.8 kg 67 kg    Examination:  General exam: Appears calm and comfortable Respiratory system: Clear to auscultation but diminished. Respiratory effort normal. Cardiovascular system: S1 & S2 heard, RRR. No murmurs, rubs, gallops or clicks. Gastrointestinal system: Abdomen is nondistended, soft and nontender. No organomegaly or masses felt. Normal bowel sounds heard. Central nervous system: Alert and oriented to person. Extremities: No edema. No calf tenderness Skin: No cyanosis. No rashes Psychiatry: Judgement and insight appear impaired. Flat affect.    Data Reviewed: I have personally reviewed following labs and imaging studies  CBC: Recent Labs  Lab 01/26/18 0639  WBC 6.5  HGB 7.4*  HCT 25.7*  MCV 100.4*  PLT 161   Basic Metabolic Panel: Recent Labs  Lab 01/27/18 0531 01/28/18 0413 01/29/18 0550 01/30/18 0333 01/31/18 0556  NA 141 142 142 142 141  K 4.6 4.7 4.7 5.0 5.1  CL 101 101 103 104 101  CO2 27 29 28 28 30   GLUCOSE 170* 128* 140* 144* 154*  BUN 140* 137* 136* 137* 135*  CREATININE 2.70* 2.66* 2.59* 2.70* 2.61*  CALCIUM 8.8* 9.0 9.0 8.9 9.2   GFR: Estimated Creatinine Clearance: 13.7 mL/min (A)  (by C-G formula based on SCr of 2.61 mg/dL (H)). Liver Function Tests: No results for input(s): AST, ALT, ALKPHOS, BILITOT, PROT, ALBUMIN in the last 168 hours. No results for input(s): LIPASE, AMYLASE in the last 168 hours. No results for input(s): AMMONIA in the last 168 hours. Coagulation Profile: No results for input(s): INR, PROTIME in the last 168 hours. Cardiac Enzymes: No results for input(s): CKTOTAL, CKMB, CKMBINDEX, TROPONINI in the last 168 hours. BNP (last 3 results) No results for input(s): PROBNP in the last 8760 hours. HbA1C: No results for input(s): HGBA1C in the last 72 hours. CBG: Recent Labs  Lab 01/30/18 2043 01/31/18 0110 01/31/18 0543 01/31/18 0749 01/31/18 1142  GLUCAP 165* 164* 132* 138* 133*   Lipid Profile: No results for input(s): CHOL, HDL, LDLCALC, TRIG, CHOLHDL, LDLDIRECT in the last 72 hours. Thyroid Function Tests: No results for input(s): TSH, T4TOTAL, FREET4, T3FREE, THYROIDAB in the last 72 hours. Anemia Panel: No results for input(s): VITAMINB12, FOLATE, FERRITIN, TIBC, IRON, RETICCTPCT in the last 72 hours. Sepsis Labs: No results for input(s): PROCALCITON, LATICACIDVEN in the last 168 hours.  No results found for this or any previous visit (from the past 240 hour(s)).       Radiology Studies: No results found.      Scheduled Meds: . atorvastatin  40 mg Per Tube q1800  . benztropine  0.5  mg Per Tube BID  . chlorhexidine  15 mL Mouth Rinse BID  . cholecalciferol  400 Units Per Tube Daily  . clopidogrel  75 mg Per Tube Daily  . collagenase   Topical Daily  . docusate  100 mg Per Tube Daily  . famotidine  10 mg Oral Daily  . feeding supplement (JEVITY 1.2 CAL)  1,000 mL Per Tube Q24H  . ferrous sulfate  300 mg Per Tube Daily  . free water  250 mL Per Tube Q8H  . insulin aspart  0-15 Units Subcutaneous Q4H  . insulin detemir  12 Units Subcutaneous Daily  . mouth rinse  15 mL Mouth Rinse q12n4p  . metoprolol tartrate  25 mg  Per Tube BID  . multivitamin  15 mL Per Tube Daily   Continuous Infusions: . sodium chloride Stopped (01/08/18 2158)     LOS: 80 days     Cordelia Poche, MD [facility] 01/31/2018, 11:47 AM  If 7PM-7AM, please contact night-coverage [email or web address]

## 2018-02-01 ENCOUNTER — Inpatient Hospital Stay (HOSPITAL_COMMUNITY): Payer: Medicare Other

## 2018-02-01 LAB — BASIC METABOLIC PANEL
Anion gap: 10 (ref 5–15)
BUN: 129 mg/dL — ABNORMAL HIGH (ref 8–23)
CO2: 27 mmol/L (ref 22–32)
Calcium: 8.9 mg/dL (ref 8.9–10.3)
Chloride: 103 mmol/L (ref 98–111)
Creatinine, Ser: 2.73 mg/dL — ABNORMAL HIGH (ref 0.44–1.00)
GFR calc Af Amer: 17 mL/min — ABNORMAL LOW (ref >60)
GFR calc non Af Amer: 15 mL/min — ABNORMAL LOW (ref >60)
GLUCOSE: 167 mg/dL — AB (ref 70–99)
Potassium: 5.4 mmol/L — ABNORMAL HIGH (ref 3.5–5.1)
Sodium: 140 mmol/L (ref 135–145)

## 2018-02-01 LAB — GLUCOSE, CAPILLARY
Glucose-Capillary: 136 mg/dL — ABNORMAL HIGH (ref 70–99)
Glucose-Capillary: 150 mg/dL — ABNORMAL HIGH (ref 70–99)
Glucose-Capillary: 150 mg/dL — ABNORMAL HIGH (ref 70–99)
Glucose-Capillary: 157 mg/dL — ABNORMAL HIGH (ref 70–99)
Glucose-Capillary: 207 mg/dL — ABNORMAL HIGH (ref 70–99)
Glucose-Capillary: 227 mg/dL — ABNORMAL HIGH (ref 70–99)

## 2018-02-01 LAB — BRAIN NATRIURETIC PEPTIDE: B Natriuretic Peptide: >4500 pg/mL — ABNORMAL HIGH (ref 0.0–100.0)

## 2018-02-01 LAB — TROPONIN I: Troponin I: 0.08 ng/mL (ref <0.03)

## 2018-02-01 MED ORDER — FUROSEMIDE 10 MG/ML IJ SOLN
40.0000 mg | Freq: Once | INTRAMUSCULAR | Status: AC
  Administered 2018-02-01: 40 mg via INTRAVENOUS
  Filled 2018-02-01: qty 4

## 2018-02-01 MED ORDER — MORPHINE SULFATE (PF) 2 MG/ML IV SOLN
2.0000 mg | Freq: Once | INTRAVENOUS | Status: AC
  Administered 2018-02-01: 2 mg via INTRAVENOUS
  Filled 2018-02-01: qty 1

## 2018-02-01 NOTE — Progress Notes (Signed)
PROGRESS NOTE    [name]  HGD:[id number] DOB: Oct 10, 1927 DOA: 12/05/2017 PCP: Deland Pretty, MD     Brief Narrative:  [name] is a 85 y.o. female with a history of diabetes, hypertension, hyperlipidemia, CAD status post CABG, history of CVA, CKD stage III who was recently diagnosed with achalasia and was being treated with Botox with no significant improvement. She was admitted with altered mental status and was found to have difficulty speaking and finding words which then progressed to total aphasia patient has been hospitalized she had prolonged hospital course significant for NSTEMI, respiratory failure requiring intubation, intermittent encephalopathy and anemia in addition to now having renal failure.  Events: 11/5 Admit 11/10 G tube placed by IR 11/13 PMT consulted 11/19 General surgery consulted, no surgical tx needed for non-incarcerated umbilical hernia  96/22 Neuro consult. Rapid response for respiratory distress, treated with lasix. 11/21 Cardiology consult for NSTEMI. 11/23 Rapid response for respiratory distress, treated with lasix and morphine 11/27 ENT consulted for stridor, flexible laryngoscopy negative 12/1 Rapid response for respiratory distress, treated with morphine 12/2 PCCM consulted for agonal respirations, transferred to ICU for intubation. Nephrology consulted  12/3 Stable right atelectasis and effusion 12/6 Extubated  12/18 Transferred to progressive unit   New events last 24 hours / Subjective: Called to patient's room due to shortness of breath post bath.  Daughter is at bedside.  She states that patient was a bit short of breath this morning which resolved, but after taking a bath and laying flat on her back, she became more short of breath, complaining of chest pain, restless, stating that she felt like she was going to die.  Assessment & Plan:   Principal Problem:   Dysarthria Active Problems:   Diabetes mellitus ([place])   Hyperlipidemia  Essential hypertension   Coronary atherosclerosis   Stroke (cerebrum) (HCC)   Stage 3 chronic kidney disease ([place])   Acute lower UTI   Adult failure to thrive   UTI (urinary tract infection)   DNR (do not resuscitate) discussion   Palliative care by specialist   CHF (congestive heart failure) ([place])   Aspiration pneumonia due to gastric secretions (HCC)   NSTEMI (non-ST elevated myocardial infarction) ([place])   Altered mental status   Acute on chronic respiratory failure with hypoxia (HCC)   Acute hypoxemic respiratory failure secondary to pulmonary edema Chest x-ray independently reviewed, bilateral pleural effusion as well as pulmonary edema.  BNP greater than 4500.  Ordered 1 dose IV Lasix 40 mg today, strict I's and O's.  Discussed with son over the phone  Chest pain EKG independently reviewed, nonspecific ST changes in lateral leads.  Similar to previous EKG.  Troponin mildly elevated 0.08, this is decreased from previous troponin.  She does have diagnosis of non-STEMI this admission.  Resolved with IV morphine.  Acute kidney injury on CKD stage III Elevated BUN and creatinine.  Contributing to confusion.  Nephrology is currently on board and was treating patient with IV fluids secondary to hypovolemia.  IV fluids currently stopped per family request.  BUN and creatinine have remained stable for the past few days. Appreciate nephrology assistance  Acute on chronic combined systolic and diastolic heart failure EF of 30%. Continue metoprolol IV Lasix as above  Acute metabolic encephalopathy Multiple comorbidities.  Currently likely attributed to by uremia.  Started on Seroquel secondary to confusion.  EEG obtained and was unremarkable.  Hypernatremia Secondary to free water deficit.  This was resolved with increasing free water through PEG  tube.  ESBL Klebsiella aspiration pneumonia Completed antibiotic course  Abnormal LFTs Transient rise. Resolved.  Anemia Chronic  disease per chart review. Patient was previously on heparin subcutaneous for DVT prophylaxis which has been discontinued. Recent FOBT from 01/22/18 negative. Hemoglobin has been stable.  Non-ST elevation MI Seen by cardiology.  Recommendations for medical management only.  Currently on a beta-blocker.  Enterococcus UTI Completed antibiotic course.  Left temporal and frontal punctate CVA Evaluated by neurology.  No vegetation or mobile clot seen on TTE. Continue Plavix and atorvastatin 40 mg  Moderate malnutrition Oropharyngeal dysphasia Currently on tube feeding through PEG. Tube feeds switched to Jevity 1.2 cal with rate slowed to 40 ml/hr  Diabetes mellitus type 2 Uncontrolled with hyper and hypoglycemia.  Patient currently on tube feeds. Continue Lantus and sliding scale insulin  Tardive dyskinesia Currently on Cogentin  Pressure injury Left buttocks, POA    DVT prophylaxis: SCD Code Status: Full Family Communication: Daughter at bedside, spoke with son Dr. Mayer Masker over the phone x 2  Disposition Plan: Pending improvement in respiratory status, kidney function   Consultants:   PCCM  General surgery  Neurology  Cardiology  Nephrology  Interventional radiology  ENT  Antimicrobials:  Anti-infectives (From admission, onward)   Start     Dose/Rate Route Frequency Ordered Stop   01/03/18 1000  meropenem (MERREM) 1 g in sodium chloride 0.9 % 100 mL IVPB     1 g 200 mL/hr over 30 Minutes Intravenous Every 12 hours 01/03/18 0924 01/09/18 0945   01/01/18 0700  piperacillin-tazobactam (ZOSYN) IVPB 2.25 g  Status:  Discontinued     2.25 g 100 mL/hr over 30 Minutes Intravenous Every 8 hours 01/01/18 0649 01/03/18 0924   12/29/17 1300  piperacillin-tazobactam (ZOSYN) IVPB 3.375 g     3.375 g 12.5 mL/hr over 240 Minutes Intravenous Every 12 hours 12/29/17 1233 12/31/17 0700   12/26/17 2200  vancomycin (VANCOCIN) IVPB 750 mg/150 ml premix  Status:  Discontinued       750 mg 150 mL/hr over 60 Minutes Intravenous Every 48 hours 12/25/17 1520 12/28/17 0804   12/25/17 2200  piperacillin-tazobactam (ZOSYN) IVPB 2.25 g  Status:  Discontinued     2.25 g 100 mL/hr over 30 Minutes Intravenous Every 8 hours 12/25/17 1520 12/29/17 1233   12/24/17 0030  vancomycin (VANCOCIN) 500 mg in sodium chloride 0.9 % 100 mL IVPB  Status:  Discontinued     500 mg 100 mL/hr over 60 Minutes Intravenous Every 24 hours 12/24/17 0017 12/25/17 1520   12/24/17 0030  piperacillin-tazobactam (ZOSYN) IVPB 3.375 g  Status:  Discontinued     3.375 g 12.5 mL/hr over 240 Minutes Intravenous Every 8 hours 12/24/17 0017 12/25/17 1520   12/10/17 0919  ceFAZolin (ANCEF) IVPB 1 g/50 mL premix  Status:  Discontinued     over 30 Minutes Intravenous Continuous PRN 12/10/17 0919 12/17/17 1336   12/10/17 0859  ceFAZolin (ANCEF) 2-4 GM/100ML-% IVPB    Note to Pharmacy:  Margaretmary Dys   : cabinet override      12/10/17 0859 12/10/17 2114   12/08/17 2200  ampicillin (OMNIPEN) 1 g in sodium chloride 0.9 % 100 mL IVPB  Status:  Discontinued     1 g 300 mL/hr over 20 Minutes Intravenous Every 8 hours 12/08/17 1535 12/12/17 1837   12/07/17 0600  ceFAZolin (ANCEF) IVPB 2g/100 mL premix     2 g 200 mL/hr over 30 Minutes Intravenous To Short Stay 12/06/17 1219 12/08/17 0600  12/06/17 1200  cefTRIAXone (ROCEPHIN) 1 g in sodium chloride 0.9 % 100 mL IVPB  Status:  Discontinued     1 g 200 mL/hr over 30 Minutes Intravenous Every 24 hours 12/05/17 1947 12/08/17 1535   12/05/17 1445  cefTRIAXone (ROCEPHIN) 1 g in sodium chloride 0.9 % 100 mL IVPB     1 g 200 mL/hr over 30 Minutes Intravenous  Once 12/05/17 1441 12/05/17 1622       Objective: Vitals:   02/01/18 1058 02/01/18 1109 02/01/18 1122 02/01/18 1133  BP: (!) 149/88 (!) 141/77 132/76   Pulse: 85 100 97   Resp:      Temp:      TempSrc:      SpO2: 97%   (!) 89%  Weight:      Height:        Intake/Output Summary (Last 24 hours) at 02/01/2018  1347 Last data filed at 02/01/2018 0730 Gross per 24 hour  Intake 1470 ml  Output 1050 ml  Net 420 ml   Filed Weights   01/26/18 0500 01/27/18 0459 01/31/18 0500  Weight: 62.2 kg 61.8 kg 67 kg    Examination:  General exam: Appears restless, has hand mittens on Respiratory system: Diminished breath sounds bilaterally, on nasal cannula O2 Cardiovascular system: S1 & S2 heard, RRR. No JVD, murmurs, rubs, gallops or clicks.  Gastrointestinal system: Abdomen is nondistended, soft and nontender.  Central nervous system: Alert Extremities: Symmetric  Skin: No rashes, lesions or ulcers on exposed skin  Psychiatry: Confused   Data Reviewed: I have personally reviewed following labs and imaging studies  CBC: Recent Labs  Lab 01/26/18 0639 01/31/18 1200  WBC 6.5 6.3  HGB 7.4* 7.5*  HCT 25.7* 25.7*  MCV 100.4* 99.6  PLT 172 338   Basic Metabolic Panel: Recent Labs  Lab 01/28/18 0413 01/29/18 0550 01/30/18 0333 01/31/18 0556 02/01/18 0459  NA 142 142 142 141 140  K 4.7 4.7 5.0 5.1 5.4*  CL 101 103 104 101 103  CO2 29 28 28 30 27   GLUCOSE 128* 140* 144* 154* 167*  BUN 137* 136* 137* 135* 129*  CREATININE 2.66* 2.59* 2.70* 2.61* 2.73*  CALCIUM 9.0 9.0 8.9 9.2 8.9   GFR: Estimated Creatinine Clearance: 13.1 mL/min (A) (by C-G formula based on SCr of 2.73 mg/dL (H)). Liver Function Tests: No results for input(s): AST, ALT, ALKPHOS, BILITOT, PROT, ALBUMIN in the last 168 hours. No results for input(s): LIPASE, AMYLASE in the last 168 hours. No results for input(s): AMMONIA in the last 168 hours. Coagulation Profile: No results for input(s): INR, PROTIME in the last 168 hours. Cardiac Enzymes: Recent Labs  Lab 02/01/18 1150  TROPONINI 0.08*   BNP (last 3 results) No results for input(s): PROBNP in the last 8760 hours. HbA1C: No results for input(s): HGBA1C in the last 72 hours. CBG: Recent Labs  Lab 01/31/18 1931 02/01/18 0002 02/01/18 0443 02/01/18 0721  02/01/18 1201  GLUCAP 111* 136* 150* 150* 227*   Lipid Profile: No results for input(s): CHOL, HDL, LDLCALC, TRIG, CHOLHDL, LDLDIRECT in the last 72 hours. Thyroid Function Tests: No results for input(s): TSH, T4TOTAL, FREET4, T3FREE, THYROIDAB in the last 72 hours. Anemia Panel: No results for input(s): VITAMINB12, FOLATE, FERRITIN, TIBC, IRON, RETICCTPCT in the last 72 hours. Sepsis Labs: No results for input(s): PROCALCITON, LATICACIDVEN in the last 168 hours.  No results found for this or any previous visit (from the past 240 hour(s)).     Radiology Studies: Dg  Chest Port 1 View  Result Date: 02/01/2018 CLINICAL DATA:  Acute onset shortness of breath and respiratory failure. Current history of CHF. EXAM: PORTABLE CHEST 1 VIEW COMPARISON:  01/13/2018 and earlier. FINDINGS: Prior sternotomy for CABG. Cardiac silhouette moderately enlarged, unchanged. Diffuse interstitial and airspace pulmonary edema, asymmetric and increased in the RIGHT lung, worse than on the examination 3 weeks ago. Large RIGHT pleural effusion, increased in size in the interval. Stable moderate-sized LEFT pleural effusion. Stable consolidation in the lower lobes. RIGHT arm PICC tip projects over the mid SVC. IMPRESSION: Diffuse interstitial and airspace pulmonary edema indicating CHF, asymmetric and increased in the RIGHT lung, worse than on the examination 3 weeks ago. Large RIGHT pleural effusion, increased in size in the interval. Stable moderate-sized LEFT pleural effusion. Electronically Signed   By: Evangeline Dakin M.D.   On: 02/01/2018 12:25      Scheduled Meds: . atorvastatin  40 mg Per Tube q1800  . benztropine  0.5 mg Per Tube BID  . chlorhexidine  15 mL Mouth Rinse BID  . cholecalciferol  400 Units Per Tube Daily  . clopidogrel  75 mg Per Tube Daily  . collagenase   Topical Daily  . docusate  100 mg Per Tube Daily  . famotidine  10 mg Oral Daily  . feeding supplement (JEVITY 1.2 CAL)  1,000 mL Per  Tube Q24H  . ferrous sulfate  300 mg Per Tube Daily  . free water  250 mL Per Tube Q8H  . furosemide  40 mg Intravenous Once  . insulin aspart  0-15 Units Subcutaneous Q4H  . insulin detemir  12 Units Subcutaneous Daily  . mouth rinse  15 mL Mouth Rinse q12n4p  . metoprolol tartrate  25 mg Per Tube BID  . multivitamin  15 mL Per Tube Daily   Continuous Infusions: . sodium chloride Stopped (01/08/18 2158)     LOS: 54 days    Time spent: 50 minutes   Dessa Phi, DO [facility] [email or web address] Password [email or web address] 02/01/2018, 1:47 PM

## 2018-02-01 NOTE — Progress Notes (Signed)
[facility] Progress Note  Name: [name] MRN: [id number] DOB: Mar 02, 1927  Subjective:  No new issues.  Foley draining well.  I/Os 1630/ 850 Cont on TF 50mL/hr + FWF Cr 2.7 > 2.61 > 2.7.  BUN  137 > 135 > 129 Discussed with daughter who is bedside and Dr. Mayer Masker her son via telephone.   Review of systems:  Denies pain, nausea.  ------------------------- Background on re-consult:  Patient previously seen by Dr. Hollie Salk earlier in the hospitalization; last evaluated 12/5.  She was felt to have AKI on CKD (BL ~1.5) at that time which was felt multifactorial secondary to sepsis CHF.  She was characterized as a poor dialysis candidate given her functional status.  Now with progressive rise in Cr/BUN and declining urine output.  She has had a prolonged hospitalization complicated by asp PNA and UTI and CVA.  Renal US 12/27 with 10.4 and 9.4 cm kidneys; no hydro.     Intake/Output Summary (Last 24 hours) at 02/01/2018 0826 Last data filed at 02/01/2018 0730 Gross per 24 hour  Intake 1590 ml  Output 1050 ml  Net 540 ml    Vitals:  Vitals:   01/31/18 0753 01/31/18 1630 02/01/18 0011 02/01/18 0726  BP: (!) 130/58 (!) 100/57 136/64 (!) 146/75  Pulse: 71 69 71 82  Resp:   18   Temp: 98.4 F (36.9 C) 98.3 F (36.8 C) 98.3 F (36.8 C) 98 F (36.7 C)  TempSrc: Oral Oral Axillary Oral  SpO2: 92% 100% 100% 100%  Weight:      Height:         Physical Exam:  General elderly female in bed chronically ill   HEENT normocephalic atraumatic extraocular movements intact sclera anicteric Neck supple trachea midline Lungs clear and unlabored Heart regular rate and rhythm no rubs or gallops appreciated Abdomen soft nontender nondistended; PEG tube in place Neuro - disoriented but calm Extremities no lower extremity edema Psych no agitation or anxiety  Medications reviewed   Labs:  BMP Latest Ref Rng & Units 02/01/2018 01/31/2018 01/30/2018  Glucose 70 - 99 mg/dL 167(H) 154(H)  144(H)  BUN 8 - 23 mg/dL 129(H) 135(H) 137(H)  Creatinine 0.44 - 1.00 mg/dL 2.73(H) 2.61(H) 2.70(H)  Sodium 135 - 145 mmol/L 140 141 142  Potassium 3.5 - 5.1 mmol/L 5.4(H) 5.1 5.0  Chloride 98 - 111 mmol/L 103 101 104  CO2 22 - 32 mmol/L 27 30 28   Calcium 8.9 - 10.3 mg/dL 8.9 9.2 8.9     Assessment/Plan:  # AKI: stable with Cr 2.7 yesterday; BUN remains in the 120s which is at least in part due to tube feeds. - multifactorial with pre-renal and ischemic insults with sepsis and hypotension.  Also with retention noted on bladder scan.  Patient is unfortunately a poor dialysis candidate due to her functional status - Continue supportive care. S/p cautious hydration given hx of CHF.  - Discussed foley with son, Dr. Mayer Masker today.  Prefers to leave in a few more days due to difficulty with insertion/catheterization due to urethral anatomy.  Continue to reevaluate daily - Changed PPI to famotidine 12/31 - Defer additional IV fluids or diuretics for today  # CKD stage III  - Baseline Cr 1.5 earlier this fall with Cr as high as 2 -2.5 from 08/2016 - 01/2017; previously seen by Dr. Florene Glen   # HTN   - acceptable control on metoprolol 25 BID only - hypotension is resolved   # Acute on chronic systolic  and diastolic CHF  - EF reported as 30%  - caution with hydration  # Achalasia- s/p G-tube placement and on feeds  # Hypernatremia - resolved; on free water replacement  # Hx CVA  # Altered mental status - appreciate assistance of family and nursing with history. Certainly multifactorial.  Justin Mend, MD 02/01/2018 8:26 AM

## 2018-02-02 LAB — BASIC METABOLIC PANEL
Anion gap: 11 (ref 5–15)
BUN: 119 mg/dL — ABNORMAL HIGH (ref 8–23)
CO2: 26 mmol/L (ref 22–32)
Calcium: 8.8 mg/dL — ABNORMAL LOW (ref 8.9–10.3)
Chloride: 101 mmol/L (ref 98–111)
Creatinine, Ser: 2.76 mg/dL — ABNORMAL HIGH (ref 0.44–1.00)
GFR calc Af Amer: 17 mL/min — ABNORMAL LOW (ref >60)
GFR calc non Af Amer: 15 mL/min — ABNORMAL LOW (ref >60)
Glucose, Bld: 174 mg/dL — ABNORMAL HIGH (ref 70–99)
Potassium: 5.4 mmol/L — ABNORMAL HIGH (ref 3.5–5.1)
Sodium: 138 mmol/L (ref 135–145)

## 2018-02-02 LAB — GLUCOSE, CAPILLARY
Glucose-Capillary: 149 mg/dL — ABNORMAL HIGH (ref 70–99)
Glucose-Capillary: 159 mg/dL — ABNORMAL HIGH (ref 70–99)
Glucose-Capillary: 161 mg/dL — ABNORMAL HIGH (ref 70–99)
Glucose-Capillary: 169 mg/dL — ABNORMAL HIGH (ref 70–99)
Glucose-Capillary: 171 mg/dL — ABNORMAL HIGH (ref 70–99)
Glucose-Capillary: 209 mg/dL — ABNORMAL HIGH (ref 70–99)

## 2018-02-02 LAB — CBC
HCT: 25 % — ABNORMAL LOW (ref 36.0–46.0)
Hemoglobin: 7.4 g/dL — ABNORMAL LOW (ref 12.0–15.0)
MCH: 29.2 pg (ref 26.0–34.0)
MCHC: 29.6 g/dL — ABNORMAL LOW (ref 30.0–36.0)
MCV: 98.8 fL (ref 80.0–100.0)
Platelets: 205 10*3/uL (ref 150–400)
RBC: 2.53 MIL/uL — ABNORMAL LOW (ref 3.87–5.11)
RDW: 17.4 % — AB (ref 11.5–15.5)
WBC: 7.1 10*3/uL (ref 4.0–10.5)
nRBC: 0 % (ref 0.0–0.2)

## 2018-02-02 MED ORDER — ISOSORBIDE MONONITRATE ER 30 MG PO TB24
30.0000 mg | ORAL_TABLET | Freq: Every day | ORAL | Status: DC
  Filled 2018-02-02: qty 1

## 2018-02-02 MED ORDER — MORPHINE SULFATE (PF) 2 MG/ML IV SOLN
2.0000 mg | Freq: Once | INTRAVENOUS | Status: AC
  Administered 2018-02-02: 2 mg via INTRAVENOUS

## 2018-02-02 MED ORDER — FUROSEMIDE 10 MG/ML IJ SOLN
INTRAMUSCULAR | Status: AC
  Filled 2018-02-02: qty 4

## 2018-02-02 MED ORDER — MORPHINE SULFATE (PF) 2 MG/ML IV SOLN
INTRAVENOUS | Status: AC
  Filled 2018-02-02: qty 1

## 2018-02-02 MED ORDER — ALBUTEROL SULFATE (2.5 MG/3ML) 0.083% IN NEBU
INHALATION_SOLUTION | RESPIRATORY_TRACT | Status: AC
  Filled 2018-02-02: qty 3

## 2018-02-02 MED ORDER — FUROSEMIDE 10 MG/ML IJ SOLN
40.0000 mg | Freq: Once | INTRAMUSCULAR | Status: AC
  Administered 2018-02-02: 40 mg via INTRAVENOUS

## 2018-02-02 MED ORDER — ISOSORB DINITRATE-HYDRALAZINE 20-37.5 MG PO TABS
0.5000 | ORAL_TABLET | Freq: Three times a day (TID) | ORAL | Status: DC
  Administered 2018-02-02 – 2018-02-26 (×72): 0.5
  Filled 2018-02-02 (×73): qty 1

## 2018-02-02 MED ORDER — ALBUTEROL SULFATE (2.5 MG/3ML) 0.083% IN NEBU
2.5000 mg | INHALATION_SOLUTION | RESPIRATORY_TRACT | Status: DC | PRN
  Administered 2018-02-02: 2.5 mg via RESPIRATORY_TRACT

## 2018-02-02 NOTE — Progress Notes (Signed)
PT Cancellation Note  Patient Details Name: [name] MRN: [id number] DOB: 03/03/1927   Cancelled Treatment:    Reason Eval/Treat Not Completed: Other (comment) Pt daughter declining therapy services, stating, "not today, she had a nitro drip this morning."  [name], PT, DPT Acute Rehabilitation Services Pager [phone] Office [phone]    [name] 02/02/2018, 11:09 AM

## 2018-02-02 NOTE — Progress Notes (Signed)
OT Cancellation Note  Patient Details Name: [name] MRN: [id number] DOB: 1927/03/28   Cancelled Treatment:    Reason Eval/Treat Not Completed: Other (comment). Pt's daughter adamantly declining therapy today,  stating, "not today, she had a nitro drip this morning."   Britt Bottom 02/02/2018, 11:58 AM

## 2018-02-02 NOTE — Progress Notes (Signed)
PROGRESS NOTE    [name]  IOE:[id number] DOB: 05/13/1927 DOA: 12/05/2017 PCP: Deland Pretty, MD     Brief Narrative:  [name] is a 85 y.o. female with a history of diabetes, hypertension, hyperlipidemia, CAD status post CABG, history of CVA, CKD stage III who was recently diagnosed with achalasia and was being treated with Botox with no significant improvement. She was admitted with altered mental status and was found to have difficulty speaking and finding words which then progressed to total aphasia patient has been hospitalized she had prolonged hospital course significant for NSTEMI, respiratory failure requiring intubation, intermittent encephalopathy and anemia in addition to now having renal failure.  Events: 11/5 Admit 11/10 G tube placed by IR 11/13 PMT consulted 11/19 General surgery consulted, no surgical tx needed for non-incarcerated umbilical hernia  18/29 Neuro consult. Rapid response for respiratory distress, treated with lasix. 11/21 Cardiology consult for NSTEMI. 11/23 Rapid response for respiratory distress, treated with lasix and morphine 11/27 ENT consulted for stridor, flexible laryngoscopy negative 12/1 Rapid response for respiratory distress, treated with morphine 12/2 PCCM consulted for agonal respirations, transferred to ICU for intubation. Nephrology consulted  12/3 Stable right atelectasis and effusion 12/6 Extubated  12/18 Transferred to progressive unit   New events last 24 hours / Subjective: Called again to patient's room due to shortness of breath.  She remains with encephalopathy, unable to provide history.  Daughter at bedside.  Assessment & Plan:   Principal Problem:   Dysarthria Active Problems:   Diabetes mellitus ([place])   Hyperlipidemia   Essential hypertension   Coronary atherosclerosis   Stroke (cerebrum) (HCC)   Stage 3 chronic kidney disease ([place])   Acute lower UTI   Adult failure to thrive   UTI (urinary tract  infection)   DNR (do not resuscitate) discussion   Palliative care by specialist   CHF (congestive heart failure) ([place])   Aspiration pneumonia due to gastric secretions (HCC)   NSTEMI (non-ST elevated myocardial infarction) ([place])   Altered mental status   Acute on chronic respiratory failure with hypoxia (HCC)   Acute hypoxemic respiratory failure secondary to pulmonary edema Chest x-ray independently reviewed, bilateral pleural effusion as well as pulmonary edema.  BNP greater than 4500.  Gave 1 dose of IV Lasix 40 mg yesterday, repeat today.  She did diurese 1500 mL over the last 24 hours.  Chest pain EKG independently reviewed, nonspecific ST changes in lateral leads.  Similar to previous EKG.  Troponin mildly elevated 0.08, this is decreased from previous troponin.  She does have diagnosis of non-STEMI this admission.  Resolved with IV morphine.  No further episode  Acute kidney injury on CKD stage III Elevated BUN and creatinine.  Contributing to confusion.  Nephrology is currently on board and was treating patient with IV fluids secondary to hypovolemia.  IV fluids currently stopped per family request.    Creatinine remains stable this morning.  Appreciate nephrology assistance  Acute on chronic combined systolic and diastolic heart failure EF of 30%. Continue metoprolol IV Lasix as above  Acute metabolic encephalopathy Multiple comorbidities.  Currently likely attributed to by uremia.  Started on Seroquel secondary to confusion.  EEG obtained and was unremarkable.  Hypernatremia Secondary to free water deficit.  This was resolved with increasing free water through PEG tube.  ESBL Klebsiella aspiration pneumonia Completed antibiotic course  Abnormal LFTs Transient rise. Resolved.  Anemia Chronic disease per chart review. Patient was previously on heparin subcutaneous for DVT prophylaxis which has been  discontinued. Recent FOBT from 01/22/18 negative. Hemoglobin has been  stable.  Non-ST elevation MI Seen by cardiology.  Recommendations for medical management only.  Currently on a beta-blocker, resume BiDil (stopped previously due to hypotension.  Currently, her blood pressure remains elevated)  Enterococcus UTI Completed antibiotic course.  Left temporal and frontal punctate CVA Evaluated by neurology.  No vegetation or mobile clot seen on TTE. Continue Plavix and atorvastatin 40 mg  Moderate malnutrition Oropharyngeal dysphasia Currently on tube feeding through PEG. Tube feeds switched to Jevity 1.2 cal with rate slowed to 40 ml/hr  Diabetes mellitus type 2 Uncontrolled with hyper and hypoglycemia.  Patient currently on tube feeds. Continue Lantus and sliding scale insulin  Tardive dyskinesia Currently on Cogentin  Pressure injury Left buttocks, POA    DVT prophylaxis: SCD Code Status: Full Family Communication: Daughter at bedside, spoke with son Dr. Mayer Masker over the phone  Disposition Plan: Pending improvement in respiratory status, kidney function   Consultants:   PCCM  General surgery  Neurology  Cardiology  Nephrology  Interventional radiology  ENT  Antimicrobials:  Anti-infectives (From admission, onward)   Start     Dose/Rate Route Frequency Ordered Stop   01/03/18 1000  meropenem (MERREM) 1 g in sodium chloride 0.9 % 100 mL IVPB     1 g 200 mL/hr over 30 Minutes Intravenous Every 12 hours 01/03/18 0924 01/09/18 0945   01/01/18 0700  piperacillin-tazobactam (ZOSYN) IVPB 2.25 g  Status:  Discontinued     2.25 g 100 mL/hr over 30 Minutes Intravenous Every 8 hours 01/01/18 0649 01/03/18 0924   12/29/17 1300  piperacillin-tazobactam (ZOSYN) IVPB 3.375 g     3.375 g 12.5 mL/hr over 240 Minutes Intravenous Every 12 hours 12/29/17 1233 12/31/17 0700   12/26/17 2200  vancomycin (VANCOCIN) IVPB 750 mg/150 ml premix  Status:  Discontinued     750 mg 150 mL/hr over 60 Minutes Intravenous Every 48 hours 12/25/17 1520  12/28/17 0804   12/25/17 2200  piperacillin-tazobactam (ZOSYN) IVPB 2.25 g  Status:  Discontinued     2.25 g 100 mL/hr over 30 Minutes Intravenous Every 8 hours 12/25/17 1520 12/29/17 1233   12/24/17 0030  vancomycin (VANCOCIN) 500 mg in sodium chloride 0.9 % 100 mL IVPB  Status:  Discontinued     500 mg 100 mL/hr over 60 Minutes Intravenous Every 24 hours 12/24/17 0017 12/25/17 1520   12/24/17 0030  piperacillin-tazobactam (ZOSYN) IVPB 3.375 g  Status:  Discontinued     3.375 g 12.5 mL/hr over 240 Minutes Intravenous Every 8 hours 12/24/17 0017 12/25/17 1520   12/10/17 0919  ceFAZolin (ANCEF) IVPB 1 g/50 mL premix  Status:  Discontinued     over 30 Minutes Intravenous Continuous PRN 12/10/17 0919 12/17/17 1336   12/10/17 0859  ceFAZolin (ANCEF) 2-4 GM/100ML-% IVPB    Note to Pharmacy:  Margaretmary Dys   : cabinet override      12/10/17 0859 12/10/17 2114   12/08/17 2200  ampicillin (OMNIPEN) 1 g in sodium chloride 0.9 % 100 mL IVPB  Status:  Discontinued     1 g 300 mL/hr over 20 Minutes Intravenous Every 8 hours 12/08/17 1535 12/12/17 1837   12/07/17 0600  ceFAZolin (ANCEF) IVPB 2g/100 mL premix     2 g 200 mL/hr over 30 Minutes Intravenous To Short Stay 12/06/17 1219 12/08/17 0600   12/06/17 1200  cefTRIAXone (ROCEPHIN) 1 g in sodium chloride 0.9 % 100 mL IVPB  Status:  Discontinued  1 g 200 mL/hr over 30 Minutes Intravenous Every 24 hours 12/05/17 1947 12/08/17 1535   12/05/17 1445  cefTRIAXone (ROCEPHIN) 1 g in sodium chloride 0.9 % 100 mL IVPB     1 g 200 mL/hr over 30 Minutes Intravenous  Once 12/05/17 1441 12/05/17 1622       Objective: Vitals:   02/02/18 0802 02/02/18 0938 02/02/18 0950 02/02/18 1137  BP: (!) 155/79 (!) 164/85 (!) 142/74 137/70  Pulse: 87 96 88   Resp:      Temp:      TempSrc:      SpO2: 98% 100% 100%   Weight:      Height:        Intake/Output Summary (Last 24 hours) at 02/02/2018 1250 Last data filed at 02/02/2018 0600 Gross per 24 hour  Intake  1480 ml  Output 1300 ml  Net 180 ml   Filed Weights   01/26/18 0500 01/27/18 0459 01/31/18 0500  Weight: 62.2 kg 61.8 kg 67 kg    Examination: General exam: Appears restless Respiratory system: Diminished breath sounds bilaterally, on nasal cannula O2, tachypneic Cardiovascular system: S1 & S2 heard, RRR. No JVD, murmurs, rubs, gallops or clicks. No pedal edema. Gastrointestinal system: Abdomen is nondistended, soft and nontender. No organomegaly or masses felt. Normal bowel sounds heard. Central nervous system: Alert  Extremities: Symmetric  Skin: No rashes, lesions or ulcers on exposed skin Psychiatry: Remains confused  Data Reviewed: I have personally reviewed following labs and imaging studies  CBC: Recent Labs  Lab 01/31/18 1200 02/02/18 0500  WBC 6.3 7.1  HGB 7.5* 7.4*  HCT 25.7* 25.0*  MCV 99.6 98.8  PLT 200 342   Basic Metabolic Panel: Recent Labs  Lab 01/29/18 0550 01/30/18 0333 01/31/18 0556 02/01/18 0459 02/02/18 0500  NA 142 142 141 140 138  K 4.7 5.0 5.1 5.4* 5.4*  CL 103 104 101 103 101  CO2 28 28 30 27 26   GLUCOSE 140* 144* 154* 167* 174*  BUN 136* 137* 135* 129* 119*  CREATININE 2.59* 2.70* 2.61* 2.73* 2.76*  CALCIUM 9.0 8.9 9.2 8.9 8.8*   GFR: Estimated Creatinine Clearance: 12.9 mL/min (A) (by C-G formula based on SCr of 2.76 mg/dL (H)). Liver Function Tests: No results for input(s): AST, ALT, ALKPHOS, BILITOT, PROT, ALBUMIN in the last 168 hours. No results for input(s): LIPASE, AMYLASE in the last 168 hours. No results for input(s): AMMONIA in the last 168 hours. Coagulation Profile: No results for input(s): INR, PROTIME in the last 168 hours. Cardiac Enzymes: Recent Labs  Lab 02/01/18 1150  TROPONINI 0.08*   BNP (last 3 results) No results for input(s): PROBNP in the last 8760 hours. HbA1C: No results for input(s): HGBA1C in the last 72 hours. CBG: Recent Labs  Lab 02/01/18 1959 02/01/18 2353 02/02/18 0445 02/02/18 0748  02/02/18 1237  GLUCAP 157* 171* 159* 161* 209*   Lipid Profile: No results for input(s): CHOL, HDL, LDLCALC, TRIG, CHOLHDL, LDLDIRECT in the last 72 hours. Thyroid Function Tests: No results for input(s): TSH, T4TOTAL, FREET4, T3FREE, THYROIDAB in the last 72 hours. Anemia Panel: No results for input(s): VITAMINB12, FOLATE, FERRITIN, TIBC, IRON, RETICCTPCT in the last 72 hours. Sepsis Labs: No results for input(s): PROCALCITON, LATICACIDVEN in the last 168 hours.  No results found for this or any previous visit (from the past 240 hour(s)).     Radiology Studies: Dg Chest Port 1 View  Result Date: 02/01/2018 CLINICAL DATA:  Acute onset shortness of breath and  respiratory failure. Current history of CHF. EXAM: PORTABLE CHEST 1 VIEW COMPARISON:  01/13/2018 and earlier. FINDINGS: Prior sternotomy for CABG. Cardiac silhouette moderately enlarged, unchanged. Diffuse interstitial and airspace pulmonary edema, asymmetric and increased in the RIGHT lung, worse than on the examination 3 weeks ago. Large RIGHT pleural effusion, increased in size in the interval. Stable moderate-sized LEFT pleural effusion. Stable consolidation in the lower lobes. RIGHT arm PICC tip projects over the mid SVC. IMPRESSION: Diffuse interstitial and airspace pulmonary edema indicating CHF, asymmetric and increased in the RIGHT lung, worse than on the examination 3 weeks ago. Large RIGHT pleural effusion, increased in size in the interval. Stable moderate-sized LEFT pleural effusion. Electronically Signed   By: Evangeline Dakin M.D.   On: 02/01/2018 12:25      Scheduled Meds: . albuterol      . atorvastatin  40 mg Per Tube q1800  . benztropine  0.5 mg Per Tube BID  . chlorhexidine  15 mL Mouth Rinse BID  . cholecalciferol  400 Units Per Tube Daily  . clopidogrel  75 mg Per Tube Daily  . collagenase   Topical Daily  . docusate  100 mg Per Tube Daily  . famotidine  10 mg Oral Daily  . feeding supplement (JEVITY 1.2  CAL)  1,000 mL Per Tube Q24H  . ferrous sulfate  300 mg Per Tube Daily  . free water  250 mL Per Tube Q8H  . insulin aspart  0-15 Units Subcutaneous Q4H  . insulin detemir  12 Units Subcutaneous Daily  . isosorbide-hydrALAZINE  0.5 tablet Per Tube TID  . mouth rinse  15 mL Mouth Rinse q12n4p  . metoprolol tartrate  25 mg Per Tube BID  . multivitamin  15 mL Per Tube Daily   Continuous Infusions: . sodium chloride Stopped (01/08/18 2158)     LOS: 55 days    Time spent: 35 minutes   Dessa Phi, DO [facility] [email or web address] Password [email or web address] 02/02/2018, 12:50 PM

## 2018-02-02 NOTE — Progress Notes (Signed)
[facility] Progress Note  Name: [name] MRN: [id number] DOB: [birth year]  Subjective:  Dyspnea noted last PM and this AM - CXR worse effusion and pulm edema.  Rec'd lasix 40 IV yesterday and today.  Was net even yest 1.5 /1.5.    Foley draining well.   Cont on TF 11mL/hr + FWF Cr 2.7 > 2.61 > 2.7 >2.76.  BUN  137 > 135 > 129 >119 Discussed with Dr. Mayer Masker her son via telephone. Daughter was not in the room when I visited.   Review of systems:  Denies pain, nausea.  ------------------------- Background on re-consult:  Patient previously seen by Dr. Hollie Salk earlier in the hospitalization; last evaluated 12/5.  She was felt to have AKI on CKD (BL ~1.5) at that time which was felt multifactorial secondary to sepsis CHF.  She was characterized as a poor dialysis candidate given her functional status.  Now with progressive rise in Cr/BUN and declining urine output.  She has had a prolonged hospitalization complicated by asp PNA and UTI and CVA.  Renal US 12/27 with 10.4 and 9.4 cm kidneys; no hydro.     Intake/Output Summary (Last 24 hours) at 02/02/2018 1626 Last data filed at 02/02/2018 0600 Gross per 24 hour  Intake 1080 ml  Output 1300 ml  Net -220 ml    Vitals:  Vitals:   02/02/18 0802 02/02/18 0938 02/02/18 0950 02/02/18 1137  BP: (!) 155/79 (!) 164/85 (!) 142/74 137/70  Pulse: 87 96 88   Resp:      Temp:      TempSrc:      SpO2: 98% 100% 100%   Weight:      Height:         Physical Exam:  General elderly female in bed chronically ill   HEENT normocephalic atraumatic extraocular movements intact sclera anicteric Neck supple trachea midline Lungs normal WOB, clear ant, dec BS in the bases Heart regular rate and rhythm no rubs or gallops appreciated Abdomen soft nontender nondistended; PEG tube in place Neuro - sleeping Extremities no lower extremity edema Psych no agitation or anxiety  Medications reviewed   Labs:  BMP Latest Ref Rng & Units 02/02/2018  02/01/2018 01/31/2018  Glucose 70 - 99 mg/dL 174(H) 167(H) 154(H)  BUN 8 - 23 mg/dL 119(H) 129(H) 135(H)  Creatinine 0.44 - 1.00 mg/dL 2.76(H) 2.73(H) 2.61(H)  Sodium 135 - 145 mmol/L 138 140 141  Potassium 3.5 - 5.1 mmol/L 5.4(H) 5.4(H) 5.1  Chloride 98 - 111 mmol/L 101 103 101  CO2 22 - 32 mmol/L 26 27 30   Calcium 8.9 - 10.3 mg/dL 8.8(L) 8.9 9.2     Assessment/Plan:  # AKI on CKD: - Baseline Cr 1.5 earlier this fall with Cr as high as 2 -2.5 from 08/2016 - 01/2017; previously seen by Dr. Florene Glen. - stable with Cr 2.7 yesterday; BUN >100 but trending down.  Azotemia is at least in part due to tube feeds. - multifactorial with pre-renal and ischemic insults with sepsis and hypotension.  Also with retention noted on bladder scan and foley placed 12/31.  Patient is unfortunately a poor dialysis candidate due to her functional status - Continue supportive care. S/p cautious hydration given hx of CHF.  - Discussed foley with son, Dr. Mayer Masker yesterday.  Prefers to leave in a few more days due to difficulty with insertion/catheterization due to urethral anatomy.  Continue to reevaluate daily - Changed PPI to famotidine 12/31 - Lasix 40 IV today with worsening pulmonary  edema, agree  # HTN   - acceptable control on metoprolol 25 BID only  # Acute on chronic systolic and diastolic CHF  - EF reported as 30%  - caution with hydration  # Achalasia- s/p G-tube placement and on feeds  # Hypernatremia - resolved; on free water replacement - Na 138 today, CTM  # Hx CVA  # Altered mental status - appreciate assistance of family and nursing with history. Certainly multifactorial.  Justin Mend, MD 02/02/2018 4:26 PM

## 2018-02-02 NOTE — Progress Notes (Signed)
Pt with 475 cc Urine output 7a-7p shift, MD updated, nursing will cont to monitor

## 2018-02-02 NOTE — Progress Notes (Addendum)
Nutrition Follow-up  DOCUMENTATION CODES:   Non-severe (moderate) malnutrition in context of chronic illness, Underweight  INTERVENTION:    Continue Jevity 1.2 at goal rate of 40 ml/hr  Provides 1152 kcals, 53 gm protein, and 775 ml free water daily   Liquid MVI daily via tube  NUTRITION DIAGNOSIS:   Moderate Malnutrition related to chronic illness(dysphagia 2/2 to achalasia) as evidenced by energy intake < or equal to 75% for > or equal to 1 month, mild fat depletion, moderate fat depletion, mild muscle depletion, moderate muscle depletion, energy intake < or equal to 50% for > or equal to 1 month, ongoing  GOAL:   Patient will meet greater than or equal to 90% of their needs, unmet  MONITOR:   TF tolerance, Labs, Skin, Weight trends, I & O's  ASSESSMENT:   [name] is a 85 y.o. female with medical history significant for DM2, HTN, HLD, CAD s/p CABG, h/o CVA, CKD3, who was recently diagnosed with achalasia after a 50 lb weight loss over the past year. She was treated with botox earlier in October and sent to SNF with the hopes she would be able to swallow her food. She has not done well since that time, has only been able to drink small amounts of milk. Son is a Management consultant in [place] and has been visiting. Yesterday he noticed she was stuttering, having difficulty getting words out, and progressed until she seemed to have total aphasia last night. She does report having dysuria and some lower abd pain. No N/V. Has a good appetite but avoids food because of the obstruction. Megace and remeron have not helped. Process of being worked up for G tube has been initiated and pt reportedly has appt with [facility] tomorrow morning. Son feels like her weight loss and FTT could be reversible if she gets a G tube as she was reportedly very functional a month ago, walking with a walker and performing all of her ADLs.   11/7 - per IR notes, imaging revealed moderate sized hiatal hernia  and colon in upper abdomen, recommending evaluating anatomy with fluoroscopy 11/8 - PICC placed 11/10 - TPN started, IR placed G-tube 11/11 - G-tube cleared for use by RI, TF initiated 11/13 - TPN d/c 11/20 - transitioned to bolus feedings 11/23 - s/p BSE, recommend continued NPO due to AMS, emesis with bolus feedings, rapid response called 11/25 - rapid response, tube feeds held and restarted in the afternoon at 20 ml/hr 11/27 - per MD, tube feeds at 35 ml/hr during the daytime and 30 ml/hr overnight 12/1 - rapid response for increased WOB 12/2 - emesis, TF turned off, intubated 12/6 - extubated, BiPAP at night  Current TF regimen: Jevity 1.2 formula at goal rate of 40 ml/hr via PEG tube. RD decreased TF rate from 55 ml/hr to 40 ml/hr per pt's son's request 12/31. Acute kidney injury on CKD stage III;Nephrology following; BUN & creatinine stable.  Free water flushes at 250 ml every 8 hours. Labs & medications reviewed. K 5.4 (H). CBG's [phone].  Diet Order:   Diet Order            Diet NPO time specified  Diet effective now             EDUCATION NEEDS:   Not appropriate for education at this time  Skin:  Skin Assessment: Skin Integrity Issues: Skin Integrity Issues:: Stage II Stage II: left buttocks, sacrum Other: partial thickness wounds to bilateral buttocks (likely from  adhesive)  Last BM:  1/2    Intake/Output Summary (Last 24 hours) at 02/02/2018 1120 Last data filed at 02/02/2018 0600 Gross per 24 hour  Intake 1480 ml  Output 1300 ml  Net 180 ml   Height:   Ht Readings from Last 1 Encounters:  12/20/17 5\' 3"  (1.6 m)   Weight:   Wt Readings from Last 1 Encounters:  01/31/18 67 kg   Ideal Body Weight:  52.3 kg  BMI:  Body mass index is 26.17 kg/m.  Estimated Nutritional Needs:   Kcal:  1450-1650  Protein:  75-90 gm  Fluid:  >/= 1.5 L  [name], RD, LDN Pager #: [phone] After-Hours Pager #: [phone]

## 2018-02-03 LAB — CBC
HEMATOCRIT: 24 % — AB (ref 36.0–46.0)
Hemoglobin: 7.2 g/dL — ABNORMAL LOW (ref 12.0–15.0)
MCH: 29.4 pg (ref 26.0–34.0)
MCHC: 30 g/dL (ref 30.0–36.0)
MCV: 98 fL (ref 80.0–100.0)
Platelets: 213 10*3/uL (ref 150–400)
RBC: 2.45 MIL/uL — ABNORMAL LOW (ref 3.87–5.11)
RDW: 17.6 % — ABNORMAL HIGH (ref 11.5–15.5)
WBC: 7.7 10*3/uL (ref 4.0–10.5)
nRBC: 0 % (ref 0.0–0.2)

## 2018-02-03 LAB — BASIC METABOLIC PANEL
Anion gap: 10 (ref 5–15)
BUN: 129 mg/dL — AB (ref 8–23)
CO2: 27 mmol/L (ref 22–32)
Calcium: 8.8 mg/dL — ABNORMAL LOW (ref 8.9–10.3)
Chloride: 100 mmol/L (ref 98–111)
Creatinine, Ser: 2.83 mg/dL — ABNORMAL HIGH (ref 0.44–1.00)
GFR calc Af Amer: 17 mL/min — ABNORMAL LOW (ref >60)
GFR calc non Af Amer: 14 mL/min — ABNORMAL LOW (ref >60)
Glucose, Bld: 172 mg/dL — ABNORMAL HIGH (ref 70–99)
POTASSIUM: 5.6 mmol/L — AB (ref 3.5–5.1)
Sodium: 137 mmol/L (ref 135–145)

## 2018-02-03 LAB — GLUCOSE, CAPILLARY
Glucose-Capillary: 123 mg/dL — ABNORMAL HIGH (ref 70–99)
Glucose-Capillary: 136 mg/dL — ABNORMAL HIGH (ref 70–99)
Glucose-Capillary: 143 mg/dL — ABNORMAL HIGH (ref 70–99)
Glucose-Capillary: 151 mg/dL — ABNORMAL HIGH (ref 70–99)
Glucose-Capillary: 162 mg/dL — ABNORMAL HIGH (ref 70–99)
Glucose-Capillary: 185 mg/dL — ABNORMAL HIGH (ref 70–99)
Glucose-Capillary: 208 mg/dL — ABNORMAL HIGH (ref 70–99)

## 2018-02-03 MED ORDER — GLYCERIN (LAXATIVE) 2.1 G RE SUPP
1.0000 | Freq: Once | RECTAL | Status: AC
  Administered 2018-02-03: 1 via RECTAL
  Filled 2018-02-03: qty 1

## 2018-02-03 MED ORDER — SODIUM ZIRCONIUM CYCLOSILICATE 10 G PO PACK
10.0000 g | PACK | Freq: Once | ORAL | Status: AC
  Administered 2018-02-03: 10 g via ORAL
  Filled 2018-02-03: qty 1

## 2018-02-03 NOTE — Progress Notes (Signed)
PROGRESS NOTE    [name]  GYI:[id number] DOB: 06/23/1927 DOA: 12/05/2017 PCP: Deland Pretty, MD     Brief Narrative:  [name] is a 85 y.o. female with a history of diabetes, hypertension, hyperlipidemia, CAD status post CABG, history of CVA, CKD stage III who was recently diagnosed with achalasia and was being treated with Botox with no significant improvement. She was admitted with altered mental status and was found to have difficulty speaking and finding words which then progressed to total aphasia patient has been hospitalized she had prolonged hospital course significant for NSTEMI, respiratory failure requiring intubation, intermittent encephalopathy and anemia in addition to now having renal failure.  Events: 11/5 Admit 11/10 G tube placed by IR 11/13 PMT consulted 11/19 General surgery consulted, no surgical tx needed for non-incarcerated umbilical hernia  35/00 Neuro consult. Rapid response for respiratory distress, treated with lasix. 11/21 Cardiology consult for NSTEMI. 11/23 Rapid response for respiratory distress, treated with lasix and morphine 11/27 ENT consulted for stridor, flexible laryngoscopy negative 12/1 Rapid response for respiratory distress, treated with morphine 12/2 PCCM consulted for agonal respirations, transferred to ICU for intubation. Nephrology consulted  12/3 Stable right atelectasis and effusion 12/6 Extubated  12/18 Transferred to progressive unit   New events last 24 hours / Subjective: Daughter at bedside.  No acute events this morning.  Patient states that she feels sick, unable to elaborate.  Assessment & Plan:   Principal Problem:   Dysarthria Active Problems:   Diabetes mellitus ([place])   Hyperlipidemia   Essential hypertension   Coronary atherosclerosis   Stroke (cerebrum) (HCC)   Stage 3 chronic kidney disease ([place])   Acute lower UTI   Adult failure to thrive   UTI (urinary tract infection)   DNR (do not resuscitate)  discussion   Palliative care by specialist   CHF (congestive heart failure) ([place])   Aspiration pneumonia due to gastric secretions (HCC)   NSTEMI (non-ST elevated myocardial infarction) ([place])   Altered mental status   Acute on chronic respiratory failure with hypoxia (HCC)   Acute hypoxemic respiratory failure secondary to pulmonary edema Chest x-ray independently reviewed, bilateral pleural effusion as well as pulmonary edema.  BNP greater than 4500.  Patient received 2 IV Lasix doses in the past 2 days.  Hold on further Lasix dosing, monitor respiratory status.  Chest pain EKG independently reviewed, nonspecific ST changes in lateral leads.  Similar to previous EKG.  Troponin mildly elevated 0.08, this is decreased from previous troponin.  She does have diagnosis of non-STEMI this admission.  Resolved with IV morphine.  No further episode  Acute kidney injury on CKD stage III Elevated BUN and creatinine.  Contributing to confusion.  Nephrology is currently on board and was treating patient with IV fluids secondary to hypovolemia.  IV fluids currently stopped per family request.    Nephrology following  Acute on chronic combined systolic and diastolic heart failure EF of 30%. Continue metoprolol Hold Lasix today  Acute metabolic encephalopathy Multiple comorbidities.  Currently likely attributed to by uremia.  Started on Seroquel secondary to confusion.  EEG obtained and was unremarkable.  Hypernatremia Secondary to free water deficit.  This was resolved with increasing free water through PEG tube.  ESBL Klebsiella aspiration pneumonia Completed antibiotic course  Abnormal LFTs Transient rise. Resolved.  Anemia Chronic disease per chart review. Patient was previously on heparin subcutaneous for DVT prophylaxis which has been discontinued. Recent FOBT from 01/22/18 negative. Hemoglobin has been stable.  Non-ST elevation MI  Seen by cardiology.  Recommendations for medical  management only.  Currently on a beta-blocker, resume BiDil (stopped previously due to hypotension.  Currently, her blood pressure remains elevated)  Enterococcus UTI Completed antibiotic course.  Left temporal and frontal punctate CVA Evaluated by neurology.  No vegetation or mobile clot seen on TTE. Continue Plavix and atorvastatin 40 mg  Moderate malnutrition Oropharyngeal dysphasia Currently on tube feeding through PEG. Tube feeds switched to Jevity 1.2 cal with rate slowed to 40 ml/hr  Diabetes mellitus type 2 Uncontrolled with hyper and hypoglycemia.  Patient currently on tube feeds. Continue Lantus and sliding scale insulin  Tardive dyskinesia Currently on Cogentin  Pressure injury Left buttocks, POA    DVT prophylaxis: SCD Code Status: Full Family Communication: Daughter at bedside, spoke with son Dr. Mayer Masker over the phone  Disposition Plan: Pending improvement in respiratory status, kidney function    Consultants:   PCCM  General surgery  Neurology  Cardiology  Nephrology  Interventional radiology  ENT  Antimicrobials:  Anti-infectives (From admission, onward)   Start     Dose/Rate Route Frequency Ordered Stop   01/03/18 1000  meropenem (MERREM) 1 g in sodium chloride 0.9 % 100 mL IVPB     1 g 200 mL/hr over 30 Minutes Intravenous Every 12 hours 01/03/18 0924 01/09/18 0945   01/01/18 0700  piperacillin-tazobactam (ZOSYN) IVPB 2.25 g  Status:  Discontinued     2.25 g 100 mL/hr over 30 Minutes Intravenous Every 8 hours 01/01/18 0649 01/03/18 0924   12/29/17 1300  piperacillin-tazobactam (ZOSYN) IVPB 3.375 g     3.375 g 12.5 mL/hr over 240 Minutes Intravenous Every 12 hours 12/29/17 1233 12/31/17 0700   12/26/17 2200  vancomycin (VANCOCIN) IVPB 750 mg/150 ml premix  Status:  Discontinued     750 mg 150 mL/hr over 60 Minutes Intravenous Every 48 hours 12/25/17 1520 12/28/17 0804   12/25/17 2200  piperacillin-tazobactam (ZOSYN) IVPB 2.25 g  Status:   Discontinued     2.25 g 100 mL/hr over 30 Minutes Intravenous Every 8 hours 12/25/17 1520 12/29/17 1233   12/24/17 0030  vancomycin (VANCOCIN) 500 mg in sodium chloride 0.9 % 100 mL IVPB  Status:  Discontinued     500 mg 100 mL/hr over 60 Minutes Intravenous Every 24 hours 12/24/17 0017 12/25/17 1520   12/24/17 0030  piperacillin-tazobactam (ZOSYN) IVPB 3.375 g  Status:  Discontinued     3.375 g 12.5 mL/hr over 240 Minutes Intravenous Every 8 hours 12/24/17 0017 12/25/17 1520   12/10/17 0919  ceFAZolin (ANCEF) IVPB 1 g/50 mL premix  Status:  Discontinued     over 30 Minutes Intravenous Continuous PRN 12/10/17 0919 12/17/17 1336   12/10/17 0859  ceFAZolin (ANCEF) 2-4 GM/100ML-% IVPB    Note to Pharmacy:  Margaretmary Dys   : cabinet override      12/10/17 0859 12/10/17 2114   12/08/17 2200  ampicillin (OMNIPEN) 1 g in sodium chloride 0.9 % 100 mL IVPB  Status:  Discontinued     1 g 300 mL/hr over 20 Minutes Intravenous Every 8 hours 12/08/17 1535 12/12/17 1837   12/07/17 0600  ceFAZolin (ANCEF) IVPB 2g/100 mL premix     2 g 200 mL/hr over 30 Minutes Intravenous To Short Stay 12/06/17 1219 12/08/17 0600   12/06/17 1200  cefTRIAXone (ROCEPHIN) 1 g in sodium chloride 0.9 % 100 mL IVPB  Status:  Discontinued     1 g 200 mL/hr over 30 Minutes Intravenous Every 24 hours 12/05/17  1947 12/08/17 1535   12/05/17 1445  cefTRIAXone (ROCEPHIN) 1 g in sodium chloride 0.9 % 100 mL IVPB     1 g 200 mL/hr over 30 Minutes Intravenous  Once 12/05/17 1441 12/05/17 1622       Objective: Vitals:   02/02/18 1738 02/02/18 1822 02/02/18 2345 02/02/18 2346  BP: (!) 163/77 135/71 (!) 152/75   Pulse: 85  83   Resp: 18  16   Temp: 98.3 F (36.8 C)   97.7 F (36.5 C)  TempSrc:    Oral  SpO2: 100% 100% 100%   Weight:      Height:        Intake/Output Summary (Last 24 hours) at 02/03/2018 1308 Last data filed at 02/02/2018 1900 Gross per 24 hour  Intake 280 ml  Output 475 ml  Net -195 ml   Filed Weights    01/26/18 0500 01/27/18 0459 01/31/18 0500  Weight: 62.2 kg 61.8 kg 67 kg     Examination: General exam: Appears calm  Respiratory system: Diminished breath sounds bilaterally, off of oxygen today Cardiovascular system: S1 & S2 heard, RRR. No JVD, murmurs, rubs, gallops or clicks. No pedal edema. Gastrointestinal system: Abdomen is nondistended, soft and nontender. No organomegaly or masses felt. Normal bowel sounds heard. Central nervous system: Alert Extremities: Symmetric Skin: No rashes, lesions or ulcers on exposed skin   Data Reviewed: I have personally reviewed following labs and imaging studies  CBC: Recent Labs  Lab 01/31/18 1200 02/02/18 0500 02/03/18 0449  WBC 6.3 7.1 7.7  HGB 7.5* 7.4* 7.2*  HCT 25.7* 25.0* 24.0*  MCV 99.6 98.8 98.0  PLT 200 205 662   Basic Metabolic Panel: Recent Labs  Lab 01/30/18 0333 01/31/18 0556 02/01/18 0459 02/02/18 0500 02/03/18 0449  NA 142 141 140 138 137  K 5.0 5.1 5.4* 5.4* 5.6*  CL 104 101 103 101 100  CO2 28 30 27 26 27   GLUCOSE 144* 154* 167* 174* 172*  BUN 137* 135* 129* 119* 129*  CREATININE 2.70* 2.61* 2.73* 2.76* 2.83*  CALCIUM 8.9 9.2 8.9 8.8* 8.8*   GFR: Estimated Creatinine Clearance: 12.6 mL/min (A) (by C-G formula based on SCr of 2.83 mg/dL (H)). Liver Function Tests: No results for input(s): AST, ALT, ALKPHOS, BILITOT, PROT, ALBUMIN in the last 168 hours. No results for input(s): LIPASE, AMYLASE in the last 168 hours. No results for input(s): AMMONIA in the last 168 hours. Coagulation Profile: No results for input(s): INR, PROTIME in the last 168 hours. Cardiac Enzymes: Recent Labs  Lab 02/01/18 1150  TROPONINI 0.08*   BNP (last 3 results) No results for input(s): PROBNP in the last 8760 hours. HbA1C: No results for input(s): HGBA1C in the last 72 hours. CBG: Recent Labs  Lab 02/02/18 2015 02/03/18 0023 02/03/18 0418 02/03/18 0814 02/03/18 1208  GLUCAP 149* 136* 162* 143* 208*   Lipid  Profile: No results for input(s): CHOL, HDL, LDLCALC, TRIG, CHOLHDL, LDLDIRECT in the last 72 hours. Thyroid Function Tests: No results for input(s): TSH, T4TOTAL, FREET4, T3FREE, THYROIDAB in the last 72 hours. Anemia Panel: No results for input(s): VITAMINB12, FOLATE, FERRITIN, TIBC, IRON, RETICCTPCT in the last 72 hours. Sepsis Labs: No results for input(s): PROCALCITON, LATICACIDVEN in the last 168 hours.  No results found for this or any previous visit (from the past 240 hour(s)).     Radiology Studies: No results found.    Scheduled Meds: . atorvastatin  40 mg Per Tube q1800  . benztropine  0.5  mg Per Tube BID  . chlorhexidine  15 mL Mouth Rinse BID  . cholecalciferol  400 Units Per Tube Daily  . clopidogrel  75 mg Per Tube Daily  . collagenase   Topical Daily  . docusate  100 mg Per Tube Daily  . famotidine  10 mg Oral Daily  . feeding supplement (JEVITY 1.2 CAL)  1,000 mL Per Tube Q24H  . ferrous sulfate  300 mg Per Tube Daily  . free water  250 mL Per Tube Q8H  . insulin aspart  0-15 Units Subcutaneous Q4H  . insulin detemir  12 Units Subcutaneous Daily  . isosorbide-hydrALAZINE  0.5 tablet Per Tube TID  . mouth rinse  15 mL Mouth Rinse q12n4p  . metoprolol tartrate  25 mg Per Tube BID  . multivitamin  15 mL Per Tube Daily   Continuous Infusions: . sodium chloride Stopped (01/08/18 2158)     LOS: 56 days    Time spent: 35 minutes   Dessa Phi, DO [facility] [email or web address] Password [email or web address] 02/03/2018, 1:08 PM

## 2018-02-03 NOTE — Progress Notes (Signed)
[facility] Progress Note  Name: [name] MRN: [id number] DOB: [birth year]  Subjective:  rec'd lasix IV past 2 days for pulmonary edema breating more comfortably this AM.  No new complaints.   Discussed with Dr. Mayer Masker her son via telephone and daughter is bedside  Review of systems:  Denies pain, nausea.  ------------------------- Background on re-consult:  Patient previously seen by Dr. Hollie Salk earlier in the hospitalization; last evaluated 12/5.  She was felt to have AKI on CKD (BL ~1.5) at that time which was felt multifactorial secondary to sepsis CHF.  She was characterized as a poor dialysis candidate given her functional status.  Now with progressive rise in Cr/BUN and declining urine output.  She has had a prolonged hospitalization complicated by asp PNA and UTI and CVA.  Renal US 12/27 with 10.4 and 9.4 cm kidneys; no hydro.     Intake/Output Summary (Last 24 hours) at 02/03/2018 0920 Last data filed at 02/02/2018 1900 Gross per 24 hour  Intake 440 ml  Output 475 ml  Net -35 ml    Vitals:  Vitals:   02/02/18 1738 02/02/18 1822 02/02/18 2345 02/02/18 2346  BP: (!) 163/77 135/71 (!) 152/75   Pulse: 85  83   Resp: 18  16   Temp: 98.3 F (36.8 C)   97.7 F (36.5 C)  TempSrc:    Oral  SpO2: 100% 100% 100%   Weight:      Height:         Physical Exam:  General elderly female in bed chronically ill   HEENT normocephalic atraumatic extraocular movements intact sclera anicteric Neck supple trachea midline Lungs normal WOB, clear ant, dec BS in the bases Heart regular rate and rhythm no rubs or gallops appreciated Abdomen soft nontender nondistended; PEG tube in place Neuro - sleeping Extremities no lower extremity edema Psych no agitation or anxiety  Medications reviewed   Labs:  BMP Latest Ref Rng & Units 02/03/2018 02/02/2018 02/01/2018  Glucose 70 - 99 mg/dL 172(H) 174(H) 167(H)  BUN 8 - 23 mg/dL 129(H) 119(H) 129(H)  Creatinine 0.44 - 1.00 mg/dL  2.83(H) 2.76(H) 2.73(H)  Sodium 135 - 145 mmol/L 137 138 140  Potassium 3.5 - 5.1 mmol/L 5.6(H) 5.4(H) 5.4(H)  Chloride 98 - 111 mmol/L 100 101 103  CO2 22 - 32 mmol/L 27 26 27   Calcium 8.9 - 10.3 mg/dL 8.8(L) 8.8(L) 8.9     Assessment/Plan:  # AKI on CKD: - Baseline Cr 1.5 earlier this fall with Cr as high as 2 -2.5 from 08/2016 - 01/2017; previously seen by Dr. Florene Glen.  - multifactorial with pre-renal and ischemic insults with sepsis and hypotension.  Also with retention noted on bladder scan and foley placed 12/31.  Patient is unfortunately a poor dialysis candidate due to her functional status  - After diuresis for past 2 days BUN and Cr up a bit. Hold on diuresis today but also fluids in setting of recent pulmonary edema - Continue supportive care.  - Discussed foley with son, Dr. Mayer Masker earlier in the week.  Prefers to leave in a few more days due to difficulty with insertion/catheterization due to urethral anatomy.  Continue to reevaluate daily - Changed PPI to famotidine 12/31  #hyperkalemia: give lokelma 10g x 1 dose today  # HTN   -  metoprolol 25 BID and bidil low dose added yesterday  # Acute on chronic systolic and diastolic CHF  - EF reported as 30%  - caution with hydration  #  Achalasia- s/p G-tube placement and on feeds  # Hypernatremia - resolved; on free water replacement - Na 137 today, CTM  # Hx CVA  # Altered mental status - appreciate assistance of family and nursing with history. Certainly multifactorial.  Justin Mend, MD 02/03/2018 9:20 AM

## 2018-02-04 LAB — CBC
HCT: 24.9 % — ABNORMAL LOW (ref 36.0–46.0)
Hemoglobin: 7.6 g/dL — ABNORMAL LOW (ref 12.0–15.0)
MCH: 29.7 pg (ref 26.0–34.0)
MCHC: 30.5 g/dL (ref 30.0–36.0)
MCV: 97.3 fL (ref 80.0–100.0)
Platelets: 234 10*3/uL (ref 150–400)
RBC: 2.56 MIL/uL — ABNORMAL LOW (ref 3.87–5.11)
RDW: 17.7 % — ABNORMAL HIGH (ref 11.5–15.5)
WBC: 8.2 10*3/uL (ref 4.0–10.5)
nRBC: 0 % (ref 0.0–0.2)

## 2018-02-04 LAB — BASIC METABOLIC PANEL
Anion gap: 12 (ref 5–15)
BUN: 127 mg/dL — ABNORMAL HIGH (ref 8–23)
CO2: 26 mmol/L (ref 22–32)
CREATININE: 3.05 mg/dL — AB (ref 0.44–1.00)
Calcium: 9.1 mg/dL (ref 8.9–10.3)
Chloride: 98 mmol/L (ref 98–111)
GFR calc Af Amer: 15 mL/min — ABNORMAL LOW (ref >60)
GFR calc non Af Amer: 13 mL/min — ABNORMAL LOW (ref >60)
Glucose, Bld: 180 mg/dL — ABNORMAL HIGH (ref 70–99)
Potassium: 5.4 mmol/L — ABNORMAL HIGH (ref 3.5–5.1)
Sodium: 136 mmol/L (ref 135–145)

## 2018-02-04 LAB — GLUCOSE, CAPILLARY
Glucose-Capillary: 157 mg/dL — ABNORMAL HIGH (ref 70–99)
Glucose-Capillary: 190 mg/dL — ABNORMAL HIGH (ref 70–99)
Glucose-Capillary: 169 mg/dL — ABNORMAL HIGH (ref 70–99)
Glucose-Capillary: 107 mg/dL — ABNORMAL HIGH (ref 70–99)
Glucose-Capillary: 127 mg/dL — ABNORMAL HIGH (ref 70–99)

## 2018-02-04 MED ORDER — BENZTROPINE MESYLATE 0.5 MG PO TABS
0.5000 mg | ORAL_TABLET | Freq: Every day | ORAL | Status: DC
  Administered 2018-02-05 – 2018-02-06 (×2): 0.5 mg
  Filled 2018-02-04 (×3): qty 1

## 2018-02-04 MED ORDER — MELATONIN 1 MG PO TABS
2.0000 mg | ORAL_TABLET | Freq: Every evening | ORAL | Status: DC | PRN
  Filled 2018-02-04: qty 2

## 2018-02-04 MED ORDER — MELATONIN 3 MG PO TABS
3.0000 mg | ORAL_TABLET | Freq: Every evening | ORAL | Status: DC | PRN
  Administered 2018-02-05 – 2018-02-26 (×19): 3 mg via ORAL
  Filled 2018-02-04 (×26): qty 1

## 2018-02-04 NOTE — Progress Notes (Signed)
[facility]    NEPHROLOGY PROGRESS NOTE  SUBJECTIVE: Calling out, confused, unable to provide any review of systems.    OBJECTIVE:  Vitals:   02/04/18 0853 02/04/18 0919  BP: (!) 150/80 (!) 143/80  Pulse: (!) 102 95  Resp: 16   Temp: (!) 97.5 F (36.4 C)   SpO2: 97%    I/O last 3 completed shifts: In: 1920 [NG/GT:1920] Out: 600 [Urine:600]   General: Confused, calling out.   HEENT: MMM [place] AT anicteric sclera Neck:  No JVD, no adenopathy CV:  Heart RRR  Lungs: Lung sounds with scattered rales bilaterally. Abd:  abd SNT/ND with normal BS GU:  Bladder non-palpable Extremities: +1 bilateral lower extremity edema Skin:  No skin rash  MEDICATIONS:   Current Facility-Administered Medications:  .  0.9 %  sodium chloride infusion, , Intravenous, PRN, Minor, Grace Bushy, NP, Stopped at 01/08/18 2158 .  acetaminophen (TYLENOL) solution 500 mg, 500 mg, Per Tube, Q6H PRN, Minor, Grace Bushy, NP, 500 mg at 02/03/18 0434 .  albuterol (PROVENTIL) (2.5 MG/3ML) 0.083% nebulizer solution 2.5 mg, 2.5 mg, Nebulization, Q4H PRN, Dessa Phi, DO, 2.5 mg at 02/02/18 0941 .  atorvastatin (LIPITOR) tablet 40 mg, 40 mg, Per Tube, q1800, Minor, Grace Bushy, NP, 40 mg at 02/04/18 1656 .  Benzocaine (HURRCAINE) 20 % mouth spray, , Mouth/Throat, QID PRN, Shelly Coss, MD .  Derrill Memo ON 02/05/2018] benztropine (COGENTIN) tablet 0.5 mg, 0.5 mg, Per Tube, Daily, Dessa Phi, DO .  chlorhexidine (PERIDEX) 0.12 % solution 15 mL, 15 mL, Mouth Rinse, BID, Minor, Grace Bushy, NP, 15 mL at 02/04/18 0917 .  cholecalciferol (VITAMIN D3) tablet 400 Units, 400 Units, Per Tube, Daily, Minor, Grace Bushy, NP, 400 Units at 02/04/18 0915 .  clopidogrel (PLAVIX) tablet 75 mg, 75 mg, Per Tube, Daily, Minor, Grace Bushy, NP, 75 mg at 02/04/18 0916 .  collagenase (SANTYL) ointment, , Topical, Daily, Bonnielee Haff, MD .  docusate (COLACE) 50 MG/5ML liquid 100 mg, 100 mg, Per Tube, Daily, Bonnielee Haff, MD,  100 mg at 02/04/18 0915 .  famotidine (PEPCID) tablet 10 mg, 10 mg, Oral, Daily, Claudia Desanctis, MD, 10 mg at 02/04/18 0915 .  feeding supplement (JEVITY 1.2 CAL) liquid 1,000 mL, 1,000 mL, Per Tube, Q24H, Mariel Aloe, MD, Last Rate: 40 mL/hr at 02/04/18 1657, 1,000 mL at 02/04/18 1657 .  ferrous sulfate 300 (60 Fe) MG/5ML syrup 300 mg, 300 mg, Per Tube, Daily, Minor, Grace Bushy, NP, 300 mg at 02/04/18 0915 .  free water 250 mL, 250 mL, Per Tube, Q8H, Bonnielee Haff, MD, 250 mL at 02/04/18 1318 .  HYDROcodone-acetaminophen (NORCO/VICODIN) 5-325 MG per tablet 1-2 tablet, 1-2 tablet, Per Tube, Q4H PRN, Bonnielee Haff, MD, 1 tablet at 02/04/18 0140 .  insulin aspart (novoLOG) injection 0-15 Units, 0-15 Units, Subcutaneous, Q4H, Minor, Grace Bushy, NP, 3 Units at 02/04/18 1317 .  insulin detemir (LEVEMIR) injection 12 Units, 12 Units, Subcutaneous, Daily, Lavina Hamman, MD, 12 Units at 02/04/18 [phone] .  [DISCONTINUED] levalbuterol (XOPENEX) nebulizer solution 0.63 mg, 0.63 mg, Nebulization, Q6H PRN, 0.63 mg at 02/01/18 1129 **AND** ipratropium (ATROVENT) nebulizer solution 0.5 mg, 0.5 mg, Nebulization, Q6H PRN, Bonnielee Haff, MD .  isosorbide-hydrALAZINE (BIDIL) 20-37.5 MG per tablet 0.5 tablet, 0.5 tablet, Per Tube, TID, Dessa Phi, DO, 0.5 tablet at 02/04/18 1656 .  MEDLINE mouth rinse, 15 mL, Mouth Rinse, q12n4p, Minor, Grace Bushy, NP, 15 mL at 02/04/18 1657 .  Melatonin TABS 3 mg, 3 mg, Oral,  QHS PRN, Dessa Phi, DO .  metoprolol tartrate (LOPRESSOR) tablet 25 mg, 25 mg, Per Tube, BID, Kc, Ramesh, MD, 25 mg at 02/04/18 0915 .  multivitamin liquid 15 mL, 15 mL, Per Tube, Daily, Minor, Grace Bushy, NP, 15 mL at 02/04/18 0917 .  nitroGLYCERIN (NITROSTAT) SL tablet 0.4 mg, 0.4 mg, Sublingual, Q5 Min x 3 PRN, Minor, Grace Bushy, NP, 0.4 mg at 02/02/18 0928 .  ondansetron (ZOFRAN) injection 4 mg, 4 mg, Intravenous, Q8H PRN, Minor, Grace Bushy, NP, 4 mg at 02/03/18 2353 .  phenol (CHLORASEPTIC) mouth  spray 1 spray, 1 spray, Mouth/Throat, PRN, Adhikari, Amrit, MD .  polyethylene glycol (MIRALAX / GLYCOLAX) packet 17 g, 17 g, Per Tube, Daily PRN, Bodenheimer, Charles A, NP, 17 g at 01/30/18 9622 .  sodium chloride flush (NS) 0.9 % injection 10-40 mL, 10-40 mL, Intracatheter, PRN, Minor, Grace Bushy, NP, 10 mL at 01/16/18 2028     LABS:  CBC Latest Ref Rng & Units 02/04/2018 02/03/2018 02/02/2018  WBC 4.0 - 10.5 K/uL 8.2 7.7 7.1  Hemoglobin 12.0 - 15.0 g/dL 7.6(L) 7.2(L) 7.4(L)  Hematocrit 36.0 - 46.0 % 24.9(L) 24.0(L) 25.0(L)  Platelets 150 - 400 K/uL 234 213 205    CMP Latest Ref Rng & Units 02/04/2018 02/03/2018 02/02/2018  Glucose 70 - 99 mg/dL 180(H) 172(H) 174(H)  BUN 8 - 23 mg/dL 127(H) 129(H) 119(H)  Creatinine 0.44 - 1.00 mg/dL 3.05(H) 2.83(H) 2.76(H)  Sodium 135 - 145 mmol/L 136 137 138  Potassium 3.5 - 5.1 mmol/L 5.4(H) 5.6(H) 5.4(H)  Chloride 98 - 111 mmol/L 98 100 101  CO2 22 - 32 mmol/L 26 27 26   Calcium 8.9 - 10.3 mg/dL 9.1 8.8(L) 8.8(L)  Total Protein 6.5 - 8.1 g/dL - - -  Total Bilirubin 0.3 - 1.2 mg/dL - - -  Alkaline Phos 38 - 126 U/L - - -  AST 15 - 41 U/L - - -  ALT 0 - 44 U/L - - -    Lab Results  Component Value Date   PTH 27 11/23/2017   PTH Comment 11/23/2017   CALCIUM 9.1 02/04/2018   CAION 1.40 12/05/2017   PHOS 4.4 01/22/2018       Component Value Date/Time   COLORURINE YELLOW 12/23/2017 1324   APPEARANCEUR CLOUDY (A) 12/23/2017 1324   LABSPEC 1.010 12/23/2017 1324   PHURINE 7.0 12/23/2017 1324   GLUCOSEU NEGATIVE 12/23/2017 1324   HGBUR MODERATE (A) 12/23/2017 1324   BILIRUBINUR NEGATIVE 12/23/2017 1324   KETONESUR NEGATIVE 12/23/2017 1324   PROTEINUR 100 (A) 12/23/2017 1324   UROBILINOGEN 0.2 06/30/2011 1214   NITRITE NEGATIVE 12/23/2017 1324   LEUKOCYTESUR LARGE (A) 12/23/2017 1324      Component Value Date/Time   PHART 7.482 (H) 01/03/2018 0420   PCO2ART 42.2 01/03/2018 0420   PO2ART 151.0 (H) 01/03/2018 0420   HCO3 31.5 (H) 01/03/2018 0420    TCO2 33 (H) 01/03/2018 0420   ACIDBASEDEF 2.8 (H) 12/20/2017 2235   O2SAT 99.0 01/03/2018 0420       Component Value Date/Time   IRON 29 01/16/2018 0503   TIBC 99 (L) 01/16/2018 0503   FERRITIN 1,311 (H) 01/16/2018 0503   IRONPCTSAT 29 01/16/2018 0503       ASSESSMENT/PLAN:     Problem List Items Addressed This Visit      Cardiovascular and Mediastinum   CHF (congestive heart failure) (HCC)   Relevant Medications   nitroGLYCERIN (NITROSTAT) SL tablet 0.4 mg   heparin injection 5,000 Units (  Completed)   atorvastatin (LIPITOR) tablet 40 mg   furosemide (LASIX) injection 60 mg (Completed)   furosemide (LASIX) 10 MG/ML injection (Completed)   furosemide (LASIX) injection 40 mg (Completed)   furosemide (LASIX) injection 40 mg (Completed)   furosemide (LASIX) injection 40 mg (Completed)   furosemide (LASIX) injection 40 mg (Completed)   furosemide (LASIX) injection 40 mg (Completed)   metoprolol tartrate (LOPRESSOR) injection 5 mg (Completed)   furosemide (LASIX) injection 20 mg (Completed)   furosemide (LASIX) injection 40 mg (Completed)   furosemide (LASIX) injection 40 mg (Completed)   furosemide (LASIX) injection 40 mg (Completed)   furosemide (LASIX) injection 40 mg (Completed)   furosemide (LASIX) injection 40 mg (Completed)   furosemide (LASIX) injection 20 mg (Completed)   metoprolol tartrate (LOPRESSOR) tablet 25 mg   furosemide (LASIX) injection 40 mg (Completed)   furosemide (LASIX) injection 40 mg (Completed)   furosemide (LASIX) injection 40 mg (Completed)   isosorbide-hydrALAZINE (BIDIL) 20-37.5 MG per tablet 0.5 tablet   Other Relevant Orders   DG Chest Port 1 View (Completed)     Digestive   Dysphagia   Relevant Orders   IR GASTROSTOMY TUBE MOD SED (Completed)     Other   Altered mental status - Primary    Other Visit Diagnoses    Acute cystitis without hematuria       Decreased breath sounds at left lung base       Relevant Orders   DG CHEST  PORT 1 VIEW (Completed)   Subcutaneous nodule       Relevant Orders   US Abdomen Limited (Completed)   SOB (shortness of breath)       Relevant Orders   DG CHEST PORT 1 VIEW (Completed)   Hypoxia       Relevant Orders   DG CHEST PORT 1 VIEW (Completed)   Dyspnea       Relevant Orders   DG Chest Port 1 View (Completed)   DG CHEST PORT 1 VIEW (Completed)   DG Chest 1 View (Completed)   Acute respiratory distress       Relevant Orders   DG Chest Port 1 View (Completed)   Encounter for orogastric (OG) tube placement       Endotracheal tube present       Respiratory failure ([place])       Relevant Orders   DG Chest Port 1 View (Completed)   DG CHEST PORT 1 VIEW (Completed)   DG Chest Port 1 View (Completed)   DG CHEST PORT 1 VIEW (Completed)   Acute respiratory failure (HCC)       Relevant Orders   DG Chest Port 1 View (Completed)   Pulmonary edema       Relevant Orders   DG CHEST PORT 1 VIEW (Completed)   Constipation       Relevant Orders   DG Abd Portable 1V (Completed)   Constipated       Relevant Orders   DG Abd Portable 1V (Completed)   ARF (acute renal failure) (HCC)       Relevant Orders   US RENAL (Completed)   Pain          1.  Chronic kidney disease stage III with baseline serum creatinine around 1.5 in December.  2.  Acute kidney injury.  Likely on the basis of sepsis and acute congestive heart failure.  She has had a progressive rise in BUN and creatinine despite adjustments of IV fluids and Lasix.  She has had a prolonged hospitalization complicated by aspiration pneumonia, UTI, and CVA.  Renal ultrasound on 12/27 revealed no evidence of hydronephrosis.  3.  Heart failure with reduced ejection fraction.  She had an acute exacerbation several days ago, with chest x-ray revealing pulmonary edema.  She was administered IV Lasix, and fluids were discontinued.  She also has underlying moderate to severe mitral regurgitation.  Will hold IV fluids and Lasix today, and  monitor subsequent progress.  This was discussed with the son.    [place], DO, [place]

## 2018-02-04 NOTE — Progress Notes (Signed)
PROGRESS NOTE    [name]  LYY:[id number] DOB: [birth year] DOA: 12/05/2017 PCP: Deland Pretty, MD     Brief Narrative:  [name] is a 85 y.o. female with a history of diabetes, hypertension, hyperlipidemia, CAD status post CABG, history of CVA, CKD stage III who was recently diagnosed with achalasia and was being treated with Botox with no significant improvement. She was admitted with altered mental status and was found to have difficulty speaking and finding words which then progressed to total aphasia patient has been hospitalized she had prolonged hospital course significant for NSTEMI, respiratory failure requiring intubation, intermittent encephalopathy and anemia in addition to now having renal failure.  Events: 11/5 Admit 11/10 G tube placed by IR 11/13 PMT consulted 11/19 General surgery consulted, no surgical tx needed for non-incarcerated umbilical hernia  12/75 Neuro consult. Rapid response for respiratory distress, treated with lasix. 11/21 Cardiology consult for NSTEMI. 11/23 Rapid response for respiratory distress, treated with lasix and morphine 11/27 ENT consulted for stridor, flexible laryngoscopy negative 12/1 Rapid response for respiratory distress, treated with morphine 12/2 PCCM consulted for agonal respirations, transferred to ICU for intubation. Nephrology consulted  12/3 Stable right atelectasis and effusion 12/6 Extubated  12/18 Transferred to progressive unit   New events last 24 hours / Subjective: Son at bedside. No acute events. Continues to be restless.   Assessment & Plan:   Principal Problem:   Dysarthria Active Problems:   Diabetes mellitus ([place])   Hyperlipidemia   Essential hypertension   Coronary atherosclerosis   Stroke (cerebrum) (HCC)   Stage 3 chronic kidney disease ([place])   Acute lower UTI   Adult failure to thrive   UTI (urinary tract infection)   DNR (do not resuscitate) discussion   Palliative care by specialist   CHF  (congestive heart failure) ([place])   Aspiration pneumonia due to gastric secretions (HCC)   NSTEMI (non-ST elevated myocardial infarction) ([place])   Altered mental status   Acute on chronic respiratory failure with hypoxia (HCC)   Acute hypoxemic respiratory failure secondary to pulmonary edema Chest x-ray independently reviewed, bilateral pleural effusion as well as pulmonary edema.  BNP greater than 4500.  Patient received 2 IV Lasix doses last week.  Hold on further Lasix dosing, monitor respiratory status.  Chest pain EKG independently reviewed, nonspecific ST changes in lateral leads.  Similar to previous EKG.  Troponin mildly elevated 0.08, this is decreased from previous troponin.  She does have diagnosis of non-STEMI this admission.  Resolved with IV morphine.  No further episode  Acute kidney injury on CKD stage III Elevated BUN and creatinine.  Contributing to confusion.  Nephrology is currently on board and was treating patient with IV fluids secondary to hypovolemia.  IV fluids currently stopped per family request.    Nephrology following  Acute on chronic combined systolic and diastolic heart failure EF of 30%. Continue metoprolol Hold Lasix today  Acute metabolic encephalopathy Multiple comorbidities.  Currently likely attributed to by uremia.  Started on Seroquel secondary to confusion.  EEG obtained and was unremarkable.  Hypernatremia Secondary to free water deficit.  This was resolved with increasing free water through PEG tube.  ESBL Klebsiella aspiration pneumonia Completed antibiotic course  Abnormal LFTs Transient rise. Resolved.  Anemia Chronic disease per chart review. Patient was previously on heparin subcutaneous for DVT prophylaxis which has been discontinued. Recent FOBT from 01/22/18 negative. Hemoglobin has been stable.  Non-ST elevation MI Seen by cardiology.  Recommendations for medical management only.  Currently  on a beta-blocker, resume BiDil  (stopped previously due to hypotension.  Currently, her blood pressure remains elevated)  Enterococcus UTI Completed antibiotic course.  Left temporal and frontal punctate CVA Evaluated by neurology.  No vegetation or mobile clot seen on TTE. Continue Plavix and atorvastatin 40 mg  Moderate malnutrition Oropharyngeal dysphasia Currently on tube feeding through PEG. Tube feeds switched to Jevity 1.2 cal with rate slowed to 40 ml/hr  Diabetes mellitus type 2 Uncontrolled with hyper and hypoglycemia.  Patient currently on tube feeds. Continue Lantus and sliding scale insulin  Tardive dyskinesia Currently on Cogentin - decrease dose today per family request   Pressure injury Left buttocks, POA    DVT prophylaxis: SCD Code Status: Full Family Communication: Son at bedside  Disposition Plan: Pending improvement in respiratory status, kidney function    Consultants:   PCCM  General surgery  Neurology  Cardiology  Nephrology  Interventional radiology  ENT  Antimicrobials:  Anti-infectives (From admission, onward)   Start     Dose/Rate Route Frequency Ordered Stop   01/03/18 1000  meropenem (MERREM) 1 g in sodium chloride 0.9 % 100 mL IVPB     1 g 200 mL/hr over 30 Minutes Intravenous Every 12 hours 01/03/18 0924 01/09/18 0945   01/01/18 0700  piperacillin-tazobactam (ZOSYN) IVPB 2.25 g  Status:  Discontinued     2.25 g 100 mL/hr over 30 Minutes Intravenous Every 8 hours 01/01/18 0649 01/03/18 0924   12/29/17 1300  piperacillin-tazobactam (ZOSYN) IVPB 3.375 g     3.375 g 12.5 mL/hr over 240 Minutes Intravenous Every 12 hours 12/29/17 1233 12/31/17 0700   12/26/17 2200  vancomycin (VANCOCIN) IVPB 750 mg/150 ml premix  Status:  Discontinued     750 mg 150 mL/hr over 60 Minutes Intravenous Every 48 hours 12/25/17 1520 12/28/17 0804   12/25/17 2200  piperacillin-tazobactam (ZOSYN) IVPB 2.25 g  Status:  Discontinued     2.25 g 100 mL/hr over 30 Minutes  Intravenous Every 8 hours 12/25/17 1520 12/29/17 1233   12/24/17 0030  vancomycin (VANCOCIN) 500 mg in sodium chloride 0.9 % 100 mL IVPB  Status:  Discontinued     500 mg 100 mL/hr over 60 Minutes Intravenous Every 24 hours 12/24/17 0017 12/25/17 1520   12/24/17 0030  piperacillin-tazobactam (ZOSYN) IVPB 3.375 g  Status:  Discontinued     3.375 g 12.5 mL/hr over 240 Minutes Intravenous Every 8 hours 12/24/17 0017 12/25/17 1520   12/10/17 0919  ceFAZolin (ANCEF) IVPB 1 g/50 mL premix  Status:  Discontinued     over 30 Minutes Intravenous Continuous PRN 12/10/17 0919 12/17/17 1336   12/10/17 0859  ceFAZolin (ANCEF) 2-4 GM/100ML-% IVPB    Note to Pharmacy:  Margaretmary Dys   : cabinet override      12/10/17 0859 12/10/17 2114   12/08/17 2200  ampicillin (OMNIPEN) 1 g in sodium chloride 0.9 % 100 mL IVPB  Status:  Discontinued     1 g 300 mL/hr over 20 Minutes Intravenous Every 8 hours 12/08/17 1535 12/12/17 1837   12/07/17 0600  ceFAZolin (ANCEF) IVPB 2g/100 mL premix     2 g 200 mL/hr over 30 Minutes Intravenous To Short Stay 12/06/17 1219 12/08/17 0600   12/06/17 1200  cefTRIAXone (ROCEPHIN) 1 g in sodium chloride 0.9 % 100 mL IVPB  Status:  Discontinued     1 g 200 mL/hr over 30 Minutes Intravenous Every 24 hours 12/05/17 1947 12/08/17 1535   12/05/17 1445  cefTRIAXone (ROCEPHIN) 1  g in sodium chloride 0.9 % 100 mL IVPB     1 g 200 mL/hr over 30 Minutes Intravenous  Once 12/05/17 1441 12/05/17 1622       Objective: Vitals:   02/03/18 1617 02/03/18 2349 02/04/18 0853 02/04/18 0919  BP: (!) 156/76 (!) 159/82 (!) 150/80 (!) 143/80  Pulse: 91 89 (!) 102 95  Resp:  (!) 21 16   Temp: (!) 97.4 F (36.3 C) 98 F (36.7 C) (!) 97.5 F (36.4 C)   TempSrc: Axillary Axillary Oral   SpO2:  99% 97%   Weight:      Height:        Intake/Output Summary (Last 24 hours) at 02/04/2018 1255 Last data filed at 02/03/2018 1800 Gross per 24 hour  Intake 1920 ml  Output 600 ml  Net 1320 ml   Filed  Weights   01/26/18 0500 01/27/18 0459 01/31/18 0500  Weight: 62.2 kg 61.8 kg 67 kg    Examination: General exam: Appears restless  Respiratory system: diminished breath sounds, no distress  Cardiovascular system: S1 & S2 heard, RRR. No JVD, murmurs, rubs, gallops or clicks. +pedal edema. Gastrointestinal system: Abdomen is nondistended, soft and nontender. No organomegaly or masses felt. Normal bowel sounds heard. Central nervous system: Alert  Extremities: Symmetric    Data Reviewed: I have personally reviewed following labs and imaging studies  CBC: Recent Labs  Lab 01/31/18 1200 02/02/18 0500 02/03/18 0449 02/04/18 0530  WBC 6.3 7.1 7.7 8.2  HGB 7.5* 7.4* 7.2* 7.6*  HCT 25.7* 25.0* 24.0* 24.9*  MCV 99.6 98.8 98.0 97.3  PLT 200 205 213 631   Basic Metabolic Panel: Recent Labs  Lab 01/31/18 0556 02/01/18 0459 02/02/18 0500 02/03/18 0449 02/04/18 0530  NA 141 140 138 137 136  K 5.1 5.4* 5.4* 5.6* 5.4*  CL 101 103 101 100 98  CO2 30 27 26 27 26   GLUCOSE 154* 167* 174* 172* 180*  BUN 135* 129* 119* 129* 127*  CREATININE 2.61* 2.73* 2.76* 2.83* 3.05*  CALCIUM 9.2 8.9 8.8* 8.8* 9.1   GFR: Estimated Creatinine Clearance: 11.7 mL/min (A) (by C-G formula based on SCr of 3.05 mg/dL (H)). Liver Function Tests: No results for input(s): AST, ALT, ALKPHOS, BILITOT, PROT, ALBUMIN in the last 168 hours. No results for input(s): LIPASE, AMYLASE in the last 168 hours. No results for input(s): AMMONIA in the last 168 hours. Coagulation Profile: No results for input(s): INR, PROTIME in the last 168 hours. Cardiac Enzymes: Recent Labs  Lab 02/01/18 1150  TROPONINI 0.08*   BNP (last 3 results) No results for input(s): PROBNP in the last 8760 hours. HbA1C: No results for input(s): HGBA1C in the last 72 hours. CBG: Recent Labs  Lab 02/03/18 1926 02/03/18 2346 02/04/18 0459 02/04/18 0853 02/04/18 1146  GLUCAP 123* 151* 157* 190* 169*   Lipid Profile: No results for  input(s): CHOL, HDL, LDLCALC, TRIG, CHOLHDL, LDLDIRECT in the last 72 hours. Thyroid Function Tests: No results for input(s): TSH, T4TOTAL, FREET4, T3FREE, THYROIDAB in the last 72 hours. Anemia Panel: No results for input(s): VITAMINB12, FOLATE, FERRITIN, TIBC, IRON, RETICCTPCT in the last 72 hours. Sepsis Labs: No results for input(s): PROCALCITON, LATICACIDVEN in the last 168 hours.  No results found for this or any previous visit (from the past 240 hour(s)).     Radiology Studies: No results found.    Scheduled Meds: . atorvastatin  40 mg Per Tube q1800  . [START ON 02/05/2018] benztropine  0.5 mg Per Tube  Daily  . chlorhexidine  15 mL Mouth Rinse BID  . cholecalciferol  400 Units Per Tube Daily  . clopidogrel  75 mg Per Tube Daily  . collagenase   Topical Daily  . docusate  100 mg Per Tube Daily  . famotidine  10 mg Oral Daily  . feeding supplement (JEVITY 1.2 CAL)  1,000 mL Per Tube Q24H  . ferrous sulfate  300 mg Per Tube Daily  . free water  250 mL Per Tube Q8H  . insulin aspart  0-15 Units Subcutaneous Q4H  . insulin detemir  12 Units Subcutaneous Daily  . isosorbide-hydrALAZINE  0.5 tablet Per Tube TID  . mouth rinse  15 mL Mouth Rinse q12n4p  . metoprolol tartrate  25 mg Per Tube BID  . multivitamin  15 mL Per Tube Daily   Continuous Infusions: . sodium chloride Stopped (01/08/18 2158)     LOS: 57 days    Time spent: 25 minutes   Dessa Phi, DO [facility] [email or web address] Password [facility] 02/04/2018, 12:55 PM

## 2018-02-05 LAB — BASIC METABOLIC PANEL
Anion gap: 10 (ref 5–15)
BUN: 128 mg/dL — ABNORMAL HIGH (ref 8–23)
CHLORIDE: 100 mmol/L (ref 98–111)
CO2: 26 mmol/L (ref 22–32)
Calcium: 9.2 mg/dL (ref 8.9–10.3)
Creatinine, Ser: 3.23 mg/dL — ABNORMAL HIGH (ref 0.44–1.00)
GFR calc Af Amer: 14 mL/min — ABNORMAL LOW (ref >60)
GFR calc non Af Amer: 12 mL/min — ABNORMAL LOW (ref >60)
Glucose, Bld: 129 mg/dL — ABNORMAL HIGH (ref 70–99)
Potassium: 5.3 mmol/L — ABNORMAL HIGH (ref 3.5–5.1)
SODIUM: 136 mmol/L (ref 135–145)

## 2018-02-05 LAB — GLUCOSE, CAPILLARY
Glucose-Capillary: 102 mg/dL — ABNORMAL HIGH (ref 70–99)
Glucose-Capillary: 118 mg/dL — ABNORMAL HIGH (ref 70–99)
Glucose-Capillary: 126 mg/dL — ABNORMAL HIGH (ref 70–99)
Glucose-Capillary: 130 mg/dL — ABNORMAL HIGH (ref 70–99)
Glucose-Capillary: 84 mg/dL (ref 70–99)
Glucose-Capillary: 87 mg/dL (ref 70–99)
Glucose-Capillary: 98 mg/dL (ref 70–99)

## 2018-02-05 LAB — CBC
HCT: 22.6 % — ABNORMAL LOW (ref 36.0–46.0)
HEMOGLOBIN: 7.1 g/dL — AB (ref 12.0–15.0)
MCH: 30.1 pg (ref 26.0–34.0)
MCHC: 31.4 g/dL (ref 30.0–36.0)
MCV: 95.8 fL (ref 80.0–100.0)
Platelets: 218 10*3/uL (ref 150–400)
RBC: 2.36 MIL/uL — ABNORMAL LOW (ref 3.87–5.11)
RDW: 18 % — ABNORMAL HIGH (ref 11.5–15.5)
WBC: 7.6 10*3/uL (ref 4.0–10.5)
nRBC: 0 % (ref 0.0–0.2)

## 2018-02-05 NOTE — Progress Notes (Signed)
[facility]    NEPHROLOGY PROGRESS NOTE  SUBJECTIVE: Calling out, confused, unable to provide any review of systems.    OBJECTIVE:  Vitals:   02/05/18 1202 02/05/18 1651  BP: 117/65 119/69  Pulse: 72 72  Resp:  (!) 24  Temp:  97.7 F (36.5 C)  SpO2: 100% 100%   I/O last 3 completed shifts: In: 1590 [NG/GT:1590] Out: 950 [Urine:950]   General: Confused, calling out.   HEENT: MMM [employer] AT anicteric sclera Neck: (+)JVD, no adenopathy CV:  Heart RRR  Lungs: Lung sounds with scattered rales bilaterally. Abd:  abd SNT/ND with normal BS GU:  Bladder non-palpable Extremities: +1 bilateral lower extremity edema Skin:  No skin rash  MEDICATIONS:   Current Facility-Administered Medications:  .  0.9 %  sodium chloride infusion, , Intravenous, PRN, Minor, Grace Bushy, NP, Stopped at 01/08/18 2158 .  acetaminophen (TYLENOL) solution 500 mg, 500 mg, Per Tube, Q6H PRN, Minor, Grace Bushy, NP, 500 mg at 02/04/18 2222 .  albuterol (PROVENTIL) (2.5 MG/3ML) 0.083% nebulizer solution 2.5 mg, 2.5 mg, Nebulization, Q4H PRN, Dessa Phi, DO, 2.5 mg at 02/02/18 0941 .  atorvastatin (LIPITOR) tablet 40 mg, 40 mg, Per Tube, q1800, Minor, Grace Bushy, NP, 40 mg at 02/05/18 1742 .  Benzocaine (HURRCAINE) 20 % mouth spray, , Mouth/Throat, QID PRN, Tawanna Solo, Amrit, MD .  benztropine (COGENTIN) tablet 0.5 mg, 0.5 mg, Per Tube, Daily, Dessa Phi, DO, 0.5 mg at 02/05/18 0935 .  chlorhexidine (PERIDEX) 0.12 % solution 15 mL, 15 mL, Mouth Rinse, BID, Minor, Grace Bushy, NP, 15 mL at 02/05/18 0933 .  cholecalciferol (VITAMIN D3) tablet 400 Units, 400 Units, Per Tube, Daily, Minor, Grace Bushy, NP, 400 Units at 02/05/18 0935 .  clopidogrel (PLAVIX) tablet 75 mg, 75 mg, Per Tube, Daily, Minor, Grace Bushy, NP, 75 mg at 02/05/18 0935 .  collagenase (SANTYL) ointment, , Topical, Daily, Bonnielee Haff, MD .  docusate (COLACE) 50 MG/5ML liquid 100 mg, 100 mg, Per Tube, Daily, Bonnielee Haff, MD, 100 mg  at 02/05/18 0934 .  famotidine (PEPCID) tablet 10 mg, 10 mg, Oral, Daily, Claudia Desanctis, MD, 10 mg at 02/05/18 0934 .  feeding supplement (JEVITY 1.2 CAL) liquid 1,000 mL, 1,000 mL, Per Tube, Q24H, Mariel Aloe, MD, Last Rate: 40 mL/hr at 02/05/18 1742, 1,000 mL at 02/05/18 1742 .  ferrous sulfate 300 (60 Fe) MG/5ML syrup 300 mg, 300 mg, Per Tube, Daily, Minor, Grace Bushy, NP, 300 mg at 02/05/18 0935 .  free water 250 mL, 250 mL, Per Tube, Q8H, Bonnielee Haff, MD, 250 mL at 02/05/18 1349 .  HYDROcodone-acetaminophen (NORCO/VICODIN) 5-325 MG per tablet 1-2 tablet, 1-2 tablet, Per Tube, Q4H PRN, Bonnielee Haff, MD, 1 tablet at 02/04/18 0140 .  insulin aspart (novoLOG) injection 0-15 Units, 0-15 Units, Subcutaneous, Q4H, Minor, Grace Bushy, NP, 2 Units at 02/04/18 2222 .  insulin detemir (LEVEMIR) injection 12 Units, 12 Units, Subcutaneous, Daily, Lavina Hamman, MD, 12 Units at 02/05/18 [phone] .  [DISCONTINUED] levalbuterol (XOPENEX) nebulizer solution 0.63 mg, 0.63 mg, Nebulization, Q6H PRN, 0.63 mg at 02/01/18 1129 **AND** ipratropium (ATROVENT) nebulizer solution 0.5 mg, 0.5 mg, Nebulization, Q6H PRN, Bonnielee Haff, MD .  isosorbide-hydrALAZINE (BIDIL) 20-37.5 MG per tablet 0.5 tablet, 0.5 tablet, Per Tube, TID, Dessa Phi, DO, 0.5 tablet at 02/05/18 1742 .  MEDLINE mouth rinse, 15 mL, Mouth Rinse, q12n4p, Minor, Grace Bushy, NP, 15 mL at 02/05/18 1743 .  Melatonin TABS 3 mg, 3 mg, Oral, QHS PRN, Maylene Roes,  Jennifer, DO .  metoprolol tartrate (LOPRESSOR) tablet 25 mg, 25 mg, Per Tube, BID, Kc, Ramesh, MD, 25 mg at 02/05/18 0933 .  multivitamin liquid 15 mL, 15 mL, Per Tube, Daily, Minor, Grace Bushy, NP, 15 mL at 02/05/18 0936 .  nitroGLYCERIN (NITROSTAT) SL tablet 0.4 mg, 0.4 mg, Sublingual, Q5 Min x 3 PRN, Minor, Grace Bushy, NP, 0.4 mg at 02/02/18 0928 .  ondansetron (ZOFRAN) injection 4 mg, 4 mg, Intravenous, Q8H PRN, Minor, Grace Bushy, NP, 4 mg at 02/03/18 2353 .  phenol (CHLORASEPTIC) mouth spray  1 spray, 1 spray, Mouth/Throat, PRN, Adhikari, Amrit, MD .  polyethylene glycol (MIRALAX / GLYCOLAX) packet 17 g, 17 g, Per Tube, Daily PRN, Bodenheimer, Charles A, NP, 17 g at 01/30/18 4128 .  sodium chloride flush (NS) 0.9 % injection 10-40 mL, 10-40 mL, Intracatheter, PRN, Minor, Grace Bushy, NP, 10 mL at 01/16/18 2028     LABS:  CBC Latest Ref Rng & Units 02/05/2018 02/04/2018 02/03/2018  WBC 4.0 - 10.5 K/uL 7.6 8.2 7.7  Hemoglobin 12.0 - 15.0 g/dL 7.1(L) 7.6(L) 7.2(L)  Hematocrit 36.0 - 46.0 % 22.6(L) 24.9(L) 24.0(L)  Platelets 150 - 400 K/uL 218 234 213    CMP Latest Ref Rng & Units 02/05/2018 02/04/2018 02/03/2018  Glucose 70 - 99 mg/dL 129(H) 180(H) 172(H)  BUN 8 - 23 mg/dL 128(H) 127(H) 129(H)  Creatinine 0.44 - 1.00 mg/dL 3.23(H) 3.05(H) 2.83(H)  Sodium 135 - 145 mmol/L 136 136 137  Potassium 3.5 - 5.1 mmol/L 5.3(H) 5.4(H) 5.6(H)  Chloride 98 - 111 mmol/L 100 98 100  CO2 22 - 32 mmol/L 26 26 27   Calcium 8.9 - 10.3 mg/dL 9.2 9.1 8.8(L)  Total Protein 6.5 - 8.1 g/dL - - -  Total Bilirubin 0.3 - 1.2 mg/dL - - -  Alkaline Phos 38 - 126 U/L - - -  AST 15 - 41 U/L - - -  ALT 0 - 44 U/L - - -    Lab Results  Component Value Date   PTH 27 11/23/2017   PTH Comment 11/23/2017   CALCIUM 9.2 02/05/2018   CAION 1.40 12/05/2017   PHOS 4.4 01/22/2018       Component Value Date/Time   COLORURINE YELLOW 12/23/2017 1324   APPEARANCEUR CLOUDY (A) 12/23/2017 1324   LABSPEC 1.010 12/23/2017 1324   PHURINE 7.0 12/23/2017 1324   GLUCOSEU NEGATIVE 12/23/2017 1324   HGBUR MODERATE (A) 12/23/2017 1324   BILIRUBINUR NEGATIVE 12/23/2017 1324   KETONESUR NEGATIVE 12/23/2017 1324   PROTEINUR 100 (A) 12/23/2017 1324   UROBILINOGEN 0.2 06/30/2011 1214   NITRITE NEGATIVE 12/23/2017 1324   LEUKOCYTESUR LARGE (A) 12/23/2017 1324      Component Value Date/Time   PHART 7.482 (H) 01/03/2018 0420   PCO2ART 42.2 01/03/2018 0420   PO2ART 151.0 (H) 01/03/2018 0420   HCO3 31.5 (H) 01/03/2018 0420   TCO2  33 (H) 01/03/2018 0420   ACIDBASEDEF 2.8 (H) 12/20/2017 2235   O2SAT 99.0 01/03/2018 0420       Component Value Date/Time   IRON 29 01/16/2018 0503   TIBC 99 (L) 01/16/2018 0503   FERRITIN 1,311 (H) 01/16/2018 0503   IRONPCTSAT 29 01/16/2018 0503       ASSESSMENT/PLAN:     Problem List Items Addressed This Visit      Cardiovascular and Mediastinum   CHF (congestive heart failure) (HCC)   Relevant Medications   nitroGLYCERIN (NITROSTAT) SL tablet 0.4 mg   heparin injection 5,000 Units (Completed)  atorvastatin (LIPITOR) tablet 40 mg   furosemide (LASIX) injection 60 mg (Completed)   furosemide (LASIX) 10 MG/ML injection (Completed)   furosemide (LASIX) injection 40 mg (Completed)   furosemide (LASIX) injection 40 mg (Completed)   furosemide (LASIX) injection 40 mg (Completed)   furosemide (LASIX) injection 40 mg (Completed)   furosemide (LASIX) injection 40 mg (Completed)   metoprolol tartrate (LOPRESSOR) injection 5 mg (Completed)   furosemide (LASIX) injection 20 mg (Completed)   furosemide (LASIX) injection 40 mg (Completed)   furosemide (LASIX) injection 40 mg (Completed)   furosemide (LASIX) injection 40 mg (Completed)   furosemide (LASIX) injection 40 mg (Completed)   furosemide (LASIX) injection 40 mg (Completed)   furosemide (LASIX) injection 20 mg (Completed)   metoprolol tartrate (LOPRESSOR) tablet 25 mg   furosemide (LASIX) injection 40 mg (Completed)   furosemide (LASIX) injection 40 mg (Completed)   furosemide (LASIX) injection 40 mg (Completed)   isosorbide-hydrALAZINE (BIDIL) 20-37.5 MG per tablet 0.5 tablet   Other Relevant Orders   DG Chest Port 1 View (Completed)     Digestive   Dysphagia   Relevant Orders   IR GASTROSTOMY TUBE MOD SED (Completed)     Other   Altered mental status - Primary    Other Visit Diagnoses    Acute cystitis without hematuria       Decreased breath sounds at left lung base       Relevant Orders   DG CHEST PORT 1 VIEW  (Completed)   Subcutaneous nodule       Relevant Orders   US Abdomen Limited (Completed)   SOB (shortness of breath)       Relevant Orders   DG CHEST PORT 1 VIEW (Completed)   Hypoxia       Relevant Orders   DG CHEST PORT 1 VIEW (Completed)   Dyspnea       Relevant Orders   DG Chest Port 1 View (Completed)   DG CHEST PORT 1 VIEW (Completed)   DG Chest 1 View (Completed)   Acute respiratory distress       Relevant Orders   DG Chest Port 1 View (Completed)   Encounter for orogastric (OG) tube placement       Endotracheal tube present       Respiratory failure ([place])       Relevant Orders   DG Chest Port 1 View (Completed)   DG CHEST PORT 1 VIEW (Completed)   DG Chest Port 1 View (Completed)   DG CHEST PORT 1 VIEW (Completed)   Acute respiratory failure (HCC)       Relevant Orders   DG Chest Port 1 View (Completed)   Pulmonary edema       Relevant Orders   DG CHEST PORT 1 VIEW (Completed)   Constipation       Relevant Orders   DG Abd Portable 1V (Completed)   Constipated       Relevant Orders   DG Abd Portable 1V (Completed)   ARF (acute renal failure) (HCC)       Relevant Orders   US RENAL (Completed)   Pain          1.  Chronic kidney disease stage III with baseline serum creatinine around 1.5 in December.  2.  Acute kidney injury.  Likely on the basis of sepsis and acute congestive heart failure.  She has had a progressive rise in BUN and creatinine despite adjustments of IV fluids and Lasix.  She has  had a prolonged hospitalization complicated by aspiration pneumonia, UTI, and CVA.  Renal ultrasound on 12/27 revealed no evidence of hydronephrosis.  Is not a dialysis candidate given advanced age and multiple comorbidities.  3.  Heart failure with reduced ejection fraction.  She had an acute exacerbation several days ago, with chest x-ray revealing pulmonary edema.  She was administered IV Lasix, and fluids were discontinued.  She also has underlying moderate to severe  mitral regurgitation.  Holding IV fluids and Lasix today, and monitor subsequent progress.  This was discussed with the son yesterday.    [place], DO, [place]

## 2018-02-05 NOTE — Progress Notes (Signed)
PT Cancellation Note  Patient Details Name: [name] MRN: [id number] DOB: Mar 29, 1927   Cancelled Treatment:    Reason Eval/Treat Not Completed: Patient declined, no reason specified RN reports patient medically OK to participate in PT today. Patient received in bed, A&Ox0, and randomly crying out/yelling loudly. Arranged bed/prepared equipment and then noted hand-written sign on napkin taped to the bed (presumably from family, Chief Strategy Officer unknown) stating "NO PHYSICAL NOR OCCUPATIONAL THERAPY TODAY, THANKS". Repositioned patient to comfort and left her in bed with bed alarm active and all other known needs met.   Due to multiple adamant refusals of PT/OT from family, PT signing off. Will require new MD order to resume skilled PT services. Thank you for the referral.    Deniece Ree PT, DPT, CBIS  Supplemental Physical Therapist [facility]    Pager [phone] Acute Rehab Office [phone]

## 2018-02-05 NOTE — Progress Notes (Signed)
PROGRESS NOTE    [name]  WPY:[id number] DOB: 09/15/1927 DOA: 12/05/2017 PCP: Deland Pretty, MD     Brief Narrative:  [name] is a 85 y.o. female with a history of diabetes, hypertension, hyperlipidemia, CAD status post CABG, history of CVA, CKD stage III who was recently diagnosed with achalasia and was being treated with Botox with no significant improvement. She was admitted with altered mental status and was found to have difficulty speaking and finding words which then progressed to total aphasia patient has been hospitalized she had prolonged hospital course significant for NSTEMI, respiratory failure requiring intubation, intermittent encephalopathy and anemia in addition to now having renal failure.  Events: 11/5 Admit 11/10 G tube placed by IR 11/13 PMT consulted 11/19 General surgery consulted, no surgical tx needed for non-incarcerated umbilical hernia  05/39 Neuro consult. Rapid response for respiratory distress, treated with lasix. 11/21 Cardiology consult for NSTEMI. 11/23 Rapid response for respiratory distress, treated with lasix and morphine 11/27 ENT consulted for stridor, flexible laryngoscopy negative 12/1 Rapid response for respiratory distress, treated with morphine 12/2 PCCM consulted for agonal respirations, transferred to ICU for intubation. Nephrology consulted  12/3 Stable right atelectasis and effusion 12/6 Extubated  12/18 Transferred to progressive unit   New events last 24 hours / Subjective: No acute events overnight.   Assessment & Plan:   Principal Problem:   Dysarthria Active Problems:   Diabetes mellitus ([place])   Hyperlipidemia   Essential hypertension   Coronary atherosclerosis   Stroke (cerebrum) (HCC)   Stage 3 chronic kidney disease ([place])   Acute lower UTI   Adult failure to thrive   UTI (urinary tract infection)   DNR (do not resuscitate) discussion   Palliative care by specialist   CHF (congestive heart failure) ([place])  Aspiration pneumonia due to gastric secretions (HCC)   NSTEMI (non-ST elevated myocardial infarction) ([place])   Altered mental status   Acute on chronic respiratory failure with hypoxia (HCC)   Acute hypoxemic respiratory failure secondary to pulmonary edema Chest x-ray independently reviewed, bilateral pleural effusion as well as pulmonary edema.  BNP greater than 4500.  Patient received 2 IV Lasix doses last week.  Hold on further Lasix dosing, monitor respiratory status.  Acute kidney injury on CKD stage III Elevated BUN and creatinine.  Contributing to confusion.  Nephrology is currently on board and was treating patient with IV fluids secondary to hypovolemia.  IV fluids currently stopped per family request.    Nephrology following. Cr increased today.   Acute on chronic combined systolic and diastolic heart failure EF of 30%. Continue metoprolol Hold Lasix today  Chest pain EKG independently reviewed, nonspecific ST changes in lateral leads.  Similar to previous EKG.  Troponin mildly elevated 0.08, this is decreased from previous troponin.  She does have diagnosis of non-STEMI this admission.  Resolved with IV morphine. No further episode  Acute metabolic encephalopathy Multiple comorbidities.  Currently likely attributed to by uremia.  Started on Seroquel secondary to confusion.  EEG obtained and was unremarkable.  Hypernatremia Secondary to free water deficit.  This was resolved with increasing free water through PEG tube.  ESBL Klebsiella aspiration pneumonia Completed antibiotic course  Abnormal LFTs Transient rise. Resolved.  Anemia Chronic disease per chart review. Patient was previously on heparin subcutaneous for DVT prophylaxis which has been discontinued. Recent FOBT from 01/22/18 negative. Hemoglobin has been stable.  Non-ST elevation MI Seen by cardiology.  Recommendations for medical management only.  Currently on a beta-blocker, resume BiDil (  stopped  previously due to hypotension.  Currently, her blood pressure remains elevated)  Enterococcus UTI Completed antibiotic course.  Left temporal and frontal punctate CVA Evaluated by neurology.  No vegetation or mobile clot seen on TTE. Continue Plavix and atorvastatin 40 mg  Moderate malnutrition Oropharyngeal dysphasia Currently on tube feeding through PEG. Tube feeds switched to Jevity 1.2 cal with rate slowed to 40 ml/hr  Diabetes mellitus type 2 Uncontrolled with hyper and hypoglycemia.  Patient currently on tube feeds. Continue Lantus and sliding scale insulin  Tardive dyskinesia Currently on Cogentin - decrease dose per family request   Pressure injury Left buttocks, POA    DVT prophylaxis: SCD Code Status: Full Family Communication: No family at bedside, unable to leave msg on voicemail  Disposition Plan: Pending improvement in respiratory status, kidney function    Consultants:   [place] surgery  Neurology  Cardiology  Nephrology  Interventional radiology  ENT  Antimicrobials:  Anti-infectives (From admission, onward)   Start     Dose/Rate Route Frequency Ordered Stop   01/03/18 1000  meropenem (MERREM) 1 g in sodium chloride 0.9 % 100 mL IVPB     1 g 200 mL/hr over 30 Minutes Intravenous Every 12 hours 01/03/18 0924 01/09/18 0945   01/01/18 0700  piperacillin-tazobactam (ZOSYN) IVPB 2.25 g  Status:  Discontinued     2.25 g 100 mL/hr over 30 Minutes Intravenous Every 8 hours 01/01/18 0649 01/03/18 0924   12/29/17 1300  piperacillin-tazobactam (ZOSYN) IVPB 3.375 g     3.375 g 12.5 mL/hr over 240 Minutes Intravenous Every 12 hours 12/29/17 1233 12/31/17 0700   12/26/17 2200  vancomycin (VANCOCIN) IVPB 750 mg/150 ml premix  Status:  Discontinued     750 mg 150 mL/hr over 60 Minutes Intravenous Every 48 hours 12/25/17 1520 12/28/17 0804   12/25/17 2200  piperacillin-tazobactam (ZOSYN) IVPB 2.25 g  Status:  Discontinued     2.25 g 100 mL/hr  over 30 Minutes Intravenous Every 8 hours 12/25/17 1520 12/29/17 1233   12/24/17 0030  vancomycin (VANCOCIN) 500 mg in sodium chloride 0.9 % 100 mL IVPB  Status:  Discontinued     500 mg 100 mL/hr over 60 Minutes Intravenous Every 24 hours 12/24/17 0017 12/25/17 1520   12/24/17 0030  piperacillin-tazobactam (ZOSYN) IVPB 3.375 g  Status:  Discontinued     3.375 g 12.5 mL/hr over 240 Minutes Intravenous Every 8 hours 12/24/17 0017 12/25/17 1520   12/10/17 0919  ceFAZolin (ANCEF) IVPB 1 g/50 mL premix  Status:  Discontinued     over 30 Minutes Intravenous Continuous PRN 12/10/17 0919 12/17/17 1336   12/10/17 0859  ceFAZolin (ANCEF) 2-4 GM/100ML-% IVPB    Note to Pharmacy:  Margaretmary Dys   : cabinet override      12/10/17 0859 12/10/17 2114   12/08/17 2200  ampicillin (OMNIPEN) 1 g in sodium chloride 0.9 % 100 mL IVPB  Status:  Discontinued     1 g 300 mL/hr over 20 Minutes Intravenous Every 8 hours 12/08/17 1535 12/12/17 1837   12/07/17 0600  ceFAZolin (ANCEF) IVPB 2g/100 mL premix     2 g 200 mL/hr over 30 Minutes Intravenous To Short Stay 12/06/17 1219 12/08/17 0600   12/06/17 1200  cefTRIAXone (ROCEPHIN) 1 g in sodium chloride 0.9 % 100 mL IVPB  Status:  Discontinued     1 g 200 mL/hr over 30 Minutes Intravenous Every 24 hours 12/05/17 1947 12/08/17 1535   12/05/17 1445  cefTRIAXone (ROCEPHIN)  1 g in sodium chloride 0.9 % 100 mL IVPB     1 g 200 mL/hr over 30 Minutes Intravenous  Once 12/05/17 1441 12/05/17 1622       Objective: Vitals:   02/04/18 0853 02/04/18 0919 02/04/18 2323 02/05/18 0838  BP: (!) 150/80 (!) 143/80 (!) 161/88 (!) 117/58  Pulse: (!) 102 95 87 76  Resp: 16  17 18   Temp: (!) 97.5 F (36.4 C)  97.7 F (36.5 C) 98.2 F (36.8 C)  TempSrc: Oral  Axillary Oral  SpO2: 97%  98% 100%  Weight:      Height:        Intake/Output Summary (Last 24 hours) at 02/05/2018 1154 Last data filed at 02/05/2018 0650 Gross per 24 hour  Intake 1590 ml  Output 950 ml  Net 640  ml   Filed Weights   01/26/18 0500 01/27/18 0459 01/31/18 0500  Weight: 62.2 kg 61.8 kg 67 kg    Examination: General exam: Appears calm and comfortable  Respiratory system: Diminished breath sounds  Cardiovascular system: S1 & S2 heard, RRR. No JVD, murmurs, rubs, gallops or clicks. +pedal edema. Gastrointestinal system: Abdomen is nondistended, soft and nontender. No organomegaly or masses felt. Normal bowel sounds heard. Central nervous system: Alert  Extremities: Symmetric  Psychiatry: Remains confused    Data Reviewed: I have personally reviewed following labs and imaging studies  CBC: Recent Labs  Lab 01/31/18 1200 02/02/18 0500 02/03/18 0449 02/04/18 0530 02/05/18 1006  WBC 6.3 7.1 7.7 8.2 7.6  HGB 7.5* 7.4* 7.2* 7.6* 7.1*  HCT 25.7* 25.0* 24.0* 24.9* 22.6*  MCV 99.6 98.8 98.0 97.3 95.8  PLT 200 205 213 234 601   Basic Metabolic Panel: Recent Labs  Lab 02/01/18 0459 02/02/18 0500 02/03/18 0449 02/04/18 0530 02/05/18 1006  NA 140 138 137 136 136  K 5.4* 5.4* 5.6* 5.4* 5.3*  CL 103 101 100 98 100  CO2 27 26 27 26 26   GLUCOSE 167* 174* 172* 180* 129*  BUN 129* 119* 129* 127* 128*  CREATININE 2.73* 2.76* 2.83* 3.05* 3.23*  CALCIUM 8.9 8.8* 8.8* 9.1 9.2   GFR: Estimated Creatinine Clearance: 11.1 mL/min (A) (by C-G formula based on SCr of 3.23 mg/dL (H)). Liver Function Tests: No results for input(s): AST, ALT, ALKPHOS, BILITOT, PROT, ALBUMIN in the last 168 hours. No results for input(s): LIPASE, AMYLASE in the last 168 hours. No results for input(s): AMMONIA in the last 168 hours. Coagulation Profile: No results for input(s): INR, PROTIME in the last 168 hours. Cardiac Enzymes: Recent Labs  Lab 02/01/18 1150  TROPONINI 0.08*   BNP (last 3 results) No results for input(s): PROBNP in the last 8760 hours. HbA1C: No results for input(s): HGBA1C in the last 72 hours. CBG: Recent Labs  Lab 02/04/18 1636 02/04/18 2015 02/05/18 0023 02/05/18 0654  02/05/18 0741  GLUCAP 107* 127* 118* 87 84   Lipid Profile: No results for input(s): CHOL, HDL, LDLCALC, TRIG, CHOLHDL, LDLDIRECT in the last 72 hours. Thyroid Function Tests: No results for input(s): TSH, T4TOTAL, FREET4, T3FREE, THYROIDAB in the last 72 hours. Anemia Panel: No results for input(s): VITAMINB12, FOLATE, FERRITIN, TIBC, IRON, RETICCTPCT in the last 72 hours. Sepsis Labs: No results for input(s): PROCALCITON, LATICACIDVEN in the last 168 hours.  No results found for this or any previous visit (from the past 240 hour(s)).     Radiology Studies: No results found.    Scheduled Meds: . atorvastatin  40 mg Per Tube q1800  .  benztropine  0.5 mg Per Tube Daily  . chlorhexidine  15 mL Mouth Rinse BID  . cholecalciferol  400 Units Per Tube Daily  . clopidogrel  75 mg Per Tube Daily  . collagenase   Topical Daily  . docusate  100 mg Per Tube Daily  . famotidine  10 mg Oral Daily  . feeding supplement (JEVITY 1.2 CAL)  1,000 mL Per Tube Q24H  . ferrous sulfate  300 mg Per Tube Daily  . free water  250 mL Per Tube Q8H  . insulin aspart  0-15 Units Subcutaneous Q4H  . insulin detemir  12 Units Subcutaneous Daily  . isosorbide-hydrALAZINE  0.5 tablet Per Tube TID  . mouth rinse  15 mL Mouth Rinse q12n4p  . metoprolol tartrate  25 mg Per Tube BID  . multivitamin  15 mL Per Tube Daily   Continuous Infusions: . sodium chloride Stopped (01/08/18 2158)     LOS: 58 days    Time spent: 20 minutes   Dessa Phi, DO [facility] [email or web address] Password [email or web address] 02/05/2018, 11:54 AM

## 2018-02-05 NOTE — Progress Notes (Signed)
OT Cancellation Note  Patient Details Name: [name] MRN: [id number] DOB: 05-30-1927   Cancelled Treatment:    Reason Eval/Treat Not Completed: Other (comment) RN verified pt could be seen by therapy today.  In room, hand-written sign on napkin taped to the bed (presumably from family, Chief Strategy Officer unknown) stating "NO PHYSICAL NOR OCCUPATIONAL THERAPY TODAY, THANKS".   Due to multiple adamant refusals of PT/OT from family, OT signing off. Will require new MD order to resume skilled OT services. Thank you for the referral.   Gypsy Decant, MS, OTR/L 02/05/2018, 3:46 PM

## 2018-02-06 ENCOUNTER — Inpatient Hospital Stay (HOSPITAL_COMMUNITY): Payer: Medicare Other

## 2018-02-06 LAB — CBC
HCT: 15.9 % — ABNORMAL LOW (ref 36.0–46.0)
HEMATOCRIT: 23.8 % — AB (ref 36.0–46.0)
Hemoglobin: 4.9 g/dL — CL (ref 12.0–15.0)
Hemoglobin: 7.2 g/dL — ABNORMAL LOW (ref 12.0–15.0)
MCH: 30.6 pg (ref 26.0–34.0)
MCH: 29.6 pg (ref 26.0–34.0)
MCHC: 30.8 g/dL (ref 30.0–36.0)
MCHC: 30.3 g/dL (ref 30.0–36.0)
MCV: 99.4 fL (ref 80.0–100.0)
MCV: 97.9 fL (ref 80.0–100.0)
Platelets: 253 10*3/uL (ref 150–400)
Platelets: 228 10*3/uL (ref 150–400)
RBC: 1.6 MIL/uL — AB (ref 3.87–5.11)
RBC: 2.43 MIL/uL — ABNORMAL LOW (ref 3.87–5.11)
RDW: 18.1 % — ABNORMAL HIGH (ref 11.5–15.5)
RDW: 17.7 % — ABNORMAL HIGH (ref 11.5–15.5)
WBC: 9.2 10*3/uL (ref 4.0–10.5)
WBC: 8.5 10*3/uL (ref 4.0–10.5)
nRBC: 0 % (ref 0.0–0.2)
nRBC: 0 % (ref 0.0–0.2)

## 2018-02-06 LAB — GLUCOSE, CAPILLARY
Glucose-Capillary: 132 mg/dL — ABNORMAL HIGH (ref 70–99)
Glucose-Capillary: 127 mg/dL — ABNORMAL HIGH (ref 70–99)
Glucose-Capillary: 117 mg/dL — ABNORMAL HIGH (ref 70–99)
Glucose-Capillary: 129 mg/dL — ABNORMAL HIGH (ref 70–99)
Glucose-Capillary: 127 mg/dL — ABNORMAL HIGH (ref 70–99)

## 2018-02-06 LAB — BASIC METABOLIC PANEL
ANION GAP: 13 (ref 5–15)
Anion gap: 10 (ref 5–15)
BUN: 131 mg/dL — ABNORMAL HIGH (ref 8–23)
BUN: 132 mg/dL — ABNORMAL HIGH (ref 8–23)
CO2: 26 mmol/L (ref 22–32)
CO2: 26 mmol/L (ref 22–32)
Calcium: 8.3 mg/dL — ABNORMAL LOW (ref 8.9–10.3)
Calcium: 8.8 mg/dL — ABNORMAL LOW (ref 8.9–10.3)
Chloride: 99 mmol/L (ref 98–111)
Chloride: 96 mmol/L — ABNORMAL LOW (ref 98–111)
Creatinine, Ser: 3.28 mg/dL — ABNORMAL HIGH (ref 0.44–1.00)
Creatinine, Ser: 3.47 mg/dL — ABNORMAL HIGH (ref 0.44–1.00)
GFR calc Af Amer: 13 mL/min — ABNORMAL LOW (ref >60)
GFR calc non Af Amer: 12 mL/min — ABNORMAL LOW (ref >60)
GFR calc non Af Amer: 11 mL/min — ABNORMAL LOW (ref >60)
GFR, EST AFRICAN AMERICAN: 14 mL/min — AB (ref >60)
GLUCOSE: 122 mg/dL — AB (ref 70–99)
Glucose, Bld: 128 mg/dL — ABNORMAL HIGH (ref 70–99)
Potassium: 6.1 mmol/L — ABNORMAL HIGH (ref 3.5–5.1)
Potassium: 5.4 mmol/L — ABNORMAL HIGH (ref 3.5–5.1)
SODIUM: 135 mmol/L (ref 135–145)
Sodium: 135 mmol/L (ref 135–145)

## 2018-02-06 LAB — TYPE AND SCREEN
ABO/RH(D): O POS
Antibody Screen: NEGATIVE

## 2018-02-06 MED ORDER — PROMETHAZINE HCL 25 MG/ML IJ SOLN
6.2500 mg | Freq: Four times a day (QID) | INTRAMUSCULAR | Status: DC | PRN
  Administered 2018-02-06 – 2018-02-26 (×34): 6.25 mg via INTRAVENOUS
  Filled 2018-02-06 (×36): qty 1

## 2018-02-06 MED ORDER — SODIUM ZIRCONIUM CYCLOSILICATE 5 G PO PACK
5.0000 g | PACK | Freq: Once | ORAL | Status: DC
  Filled 2018-02-06: qty 1

## 2018-02-06 MED ORDER — DEXTROSE 50 % IV SOLN: 1.0000 | Freq: Once | INTRAVENOUS | Status: DC

## 2018-02-06 MED ORDER — CALCIUM GLUCONATE-NACL 1-0.675 GM/50ML-% IV SOLN
1.0000 g | Freq: Once | INTRAVENOUS | Status: DC
  Filled 2018-02-06: qty 50

## 2018-02-06 MED ORDER — INSULIN ASPART 100 UNIT/ML IV SOLN: 10.0000 [IU] | Freq: Once | INTRAVENOUS | Status: DC

## 2018-02-06 MED ORDER — FUROSEMIDE 10 MG/ML IJ SOLN: 20.0000 mg | Freq: Once | INTRAMUSCULAR | Status: DC

## 2018-02-06 MED ORDER — SODIUM CHLORIDE 0.9% IV SOLUTION: Freq: Once | INTRAVENOUS | Status: DC

## 2018-02-06 NOTE — Progress Notes (Signed)
Labs redrawn this am and numbers much better without intervention. Family Updated. Order to disimpact pt, attempted without success. Pt had no stool present.

## 2018-02-06 NOTE — Progress Notes (Signed)
Upon turning and cleaning the pt, it was noted that pt had a large amount of rust colored blood on her pad, I determined it is coming from her vaginal area,. No open sores noted. On call doctor was notified when notified of critical value hemoglobin.

## 2018-02-06 NOTE — Progress Notes (Signed)
PROGRESS NOTE    [name]  VXB:[id number] DOB: [birth year] DOA: 12/05/2017 PCP: Deland Pretty, MD     Brief Narrative:  [name] is a 85 y.o. female with a history of diabetes, hypertension, hyperlipidemia, CAD status post CABG, history of CVA, CKD stage III who was recently diagnosed with achalasia and was being treated with Botox with no significant improvement. She was admitted with altered mental status and was found to have difficulty speaking and finding words which then progressed to total aphasia patient has been hospitalized she had prolonged hospital course significant for NSTEMI, respiratory failure requiring intubation, intermittent encephalopathy and anemia in addition to now having renal failure.  Events: 11/5 Admit 11/10 G tube placed by IR 11/13 PMT consulted 11/19 General surgery consulted, no surgical tx needed for non-incarcerated umbilical hernia  33/00 Neuro consult. Rapid response for respiratory distress, treated with lasix. 11/21 Cardiology consult for NSTEMI. 11/23 Rapid response for respiratory distress, treated with lasix and morphine 11/27 ENT consulted for stridor, flexible laryngoscopy negative 12/1 Rapid response for respiratory distress, treated with morphine 12/2 PCCM consulted for agonal respirations, transferred to ICU for intubation. Nephrology consulted  12/3 Stable right atelectasis and effusion 12/6 Extubated  12/18 Transferred to progressive unit   New events last 24 hours / Subjective: Overnight, her hemoglobin dropped down to 4.9.  However on repeat hemoglobin was 7.2.  There was some question of vaginal bleeding, spoke with the son who states that patient was diagnosed with a uterine fibroma in the past.  Son is wondering about her bowel movements, has not had any in the past couple of days.  Requesting manual disimpaction as this has worked in the past.  Patient is more alert and interactive than she had been in the past week.  She is  able to say "good morning" although does not answer other questions.   Assessment & Plan:   Principal Problem:   Dysarthria Active Problems:   Diabetes mellitus ([place])   Hyperlipidemia   Essential hypertension   Coronary atherosclerosis   Stroke (cerebrum) (HCC)   Stage 3 chronic kidney disease ([place])   Acute lower UTI   Adult failure to thrive   UTI (urinary tract infection)   DNR (do not resuscitate) discussion   Palliative care by specialist   CHF (congestive heart failure) ([place])   Aspiration pneumonia due to gastric secretions (HCC)   NSTEMI (non-ST elevated myocardial infarction) ([place])   Altered mental status   Acute on chronic respiratory failure with hypoxia (HCC)   Acute hypoxemic respiratory failure secondary to pulmonary edema Chest x-ray independently reviewed, bilateral pleural effusion as well as pulmonary edema.  BNP greater than 4500.  Patient received 2 IV Lasix doses last week.  Hold on further Lasix dosing, monitor respiratory status.  Acute kidney injury on CKD stage III Elevated BUN and creatinine.  Contributing to confusion.  Nephrology is currently on board and was treating patient with IV fluids secondary to hypovolemia.  IV fluids currently stopped per family request.    Nephrology following. Cr increased today.   Acute on chronic combined systolic and diastolic heart failure EF of 30%. Continue metoprolol Hold Lasix today  Chest pain EKG independently reviewed, nonspecific ST changes in lateral leads.  Similar to previous EKG.  Troponin mildly elevated 0.08, this is decreased from previous troponin.  She does have diagnosis of non-STEMI this admission.  Resolved with IV morphine. No further episode  Acute metabolic encephalopathy Multiple comorbidities.  Currently likely attributed to by  uremia.  Started on Seroquel secondary to confusion.  EEG obtained and was unremarkable.  Vaginal bleeding secondary to uterine fibroma Continue to monitor hemoglobin,  stable today 7.2  Hypernatremia Secondary to free water deficit.  This was resolved with increasing free water through PEG tube.  ESBL Klebsiella aspiration pneumonia Completed antibiotic course  Abnormal LFTs Transient rise. Resolved.  Non-ST elevation MI Seen by cardiology.  Recommendations for medical management only.  Currently on a beta-blocker, resume BiDil (stopped previously due to hypotension.  Currently, her blood pressure remains elevated)  Enterococcus UTI Completed antibiotic course.  Left temporal and frontal punctate CVA Evaluated by neurology.  No vegetation or mobile clot seen on TTE. Continue Plavix and atorvastatin 40 mg  Moderate malnutrition Oropharyngeal dysphasia Currently on tube feeding through PEG  Diabetes mellitus type 2 Uncontrolled with hyper and hypoglycemia.  Patient currently on tube feeds. Continue Lantus and sliding scale insulin  Tardive dyskinesia Currently on Cogentin - decrease dose per family request and continue to taper   Pressure injury Left buttocks, POA    DVT prophylaxis: SCD Code Status: Full Family Communication: Spoke with son at bedside  Disposition Plan: Pending improvement in respiratory status, kidney function    Consultants:   PCCM  General surgery  Neurology  Cardiology  Nephrology  Interventional radiology  ENT  Antimicrobials:  Anti-infectives (From admission, onward)   Start     Dose/Rate Route Frequency Ordered Stop   01/03/18 1000  meropenem (MERREM) 1 g in sodium chloride 0.9 % 100 mL IVPB     1 g 200 mL/hr over 30 Minutes Intravenous Every 12 hours 01/03/18 0924 01/09/18 0945   01/01/18 0700  piperacillin-tazobactam (ZOSYN) IVPB 2.25 g  Status:  Discontinued     2.25 g 100 mL/hr over 30 Minutes Intravenous Every 8 hours 01/01/18 0649 01/03/18 0924   12/29/17 1300  piperacillin-tazobactam (ZOSYN) IVPB 3.375 g     3.375 g 12.5 mL/hr over 240 Minutes Intravenous Every 12 hours  12/29/17 1233 12/31/17 0700   12/26/17 2200  vancomycin (VANCOCIN) IVPB 750 mg/150 ml premix  Status:  Discontinued     750 mg 150 mL/hr over 60 Minutes Intravenous Every 48 hours 12/25/17 1520 12/28/17 0804   12/25/17 2200  piperacillin-tazobactam (ZOSYN) IVPB 2.25 g  Status:  Discontinued     2.25 g 100 mL/hr over 30 Minutes Intravenous Every 8 hours 12/25/17 1520 12/29/17 1233   12/24/17 0030  vancomycin (VANCOCIN) 500 mg in sodium chloride 0.9 % 100 mL IVPB  Status:  Discontinued     500 mg 100 mL/hr over 60 Minutes Intravenous Every 24 hours 12/24/17 0017 12/25/17 1520   12/24/17 0030  piperacillin-tazobactam (ZOSYN) IVPB 3.375 g  Status:  Discontinued     3.375 g 12.5 mL/hr over 240 Minutes Intravenous Every 8 hours 12/24/17 0017 12/25/17 1520   12/10/17 0919  ceFAZolin (ANCEF) IVPB 1 g/50 mL premix  Status:  Discontinued     over 30 Minutes Intravenous Continuous PRN 12/10/17 0919 12/17/17 1336   12/10/17 0859  ceFAZolin (ANCEF) 2-4 GM/100ML-% IVPB    Note to Pharmacy:  Margaretmary Dys   : cabinet override      12/10/17 0859 12/10/17 2114   12/08/17 2200  ampicillin (OMNIPEN) 1 g in sodium chloride 0.9 % 100 mL IVPB  Status:  Discontinued     1 g 300 mL/hr over 20 Minutes Intravenous Every 8 hours 12/08/17 1535 12/12/17 1837   12/07/17 0600  ceFAZolin (ANCEF) IVPB 2g/100 mL premix  2 g 200 mL/hr over 30 Minutes Intravenous To Short Stay 12/06/17 1219 12/08/17 0600   12/06/17 1200  cefTRIAXone (ROCEPHIN) 1 g in sodium chloride 0.9 % 100 mL IVPB  Status:  Discontinued     1 g 200 mL/hr over 30 Minutes Intravenous Every 24 hours 12/05/17 1947 12/08/17 1535   12/05/17 1445  cefTRIAXone (ROCEPHIN) 1 g in sodium chloride 0.9 % 100 mL IVPB     1 g 200 mL/hr over 30 Minutes Intravenous  Once 12/05/17 1441 12/05/17 1622       Objective: Vitals:   02/05/18 1651 02/05/18 2314 02/06/18 0600 02/06/18 0742  BP: 119/69 (!) 93/56  118/72  Pulse: 72 62  71  Resp: (!) 24 18  (!) 22    Temp: 97.7 F (36.5 C)   (!) 97.4 F (36.3 C)  TempSrc:      SpO2: 100% 100%  100%  Weight:   71 kg   Height:        Intake/Output Summary (Last 24 hours) at 02/06/2018 1259 Last data filed at 02/06/2018 1100 Gross per 24 hour  Intake 1493.33 ml  Output 425 ml  Net 1068.33 ml   Filed Weights   01/27/18 0459 01/31/18 0500 02/06/18 0600  Weight: 61.8 kg 67 kg 71 kg    Examination: General exam: Appears calm and comfortable  Respiratory system: Diminished breath sounds bilaterally, in no acute respiratory distress Cardiovascular system: S1 & S2 heard, RRR. No JVD, murmurs, rubs, gallops or clicks. +pedal edema. Gastrointestinal system: Abdomen is nondistended, soft and nontender. No organomegaly or masses felt. Normal bowel sounds heard. Central nervous system: Alert Extremities: Symmetric   Data Reviewed: I have personally reviewed following labs and imaging studies  CBC: Recent Labs  Lab 02/03/18 0449 02/04/18 0530 02/05/18 1006 02/06/18 0342 02/06/18 0828  WBC 7.7 8.2 7.6 9.2 8.5  HGB 7.2* 7.6* 7.1* 4.9* 7.2*  HCT 24.0* 24.9* 22.6* 15.9* 23.8*  MCV 98.0 97.3 95.8 99.4 97.9  PLT 213 234 218 253 616   Basic Metabolic Panel: Recent Labs  Lab 02/03/18 0449 02/04/18 0530 02/05/18 1006 02/06/18 0342 02/06/18 0828  NA 137 136 136 135 135  K 5.6* 5.4* 5.3* 6.1* 5.4*  CL 100 98 100 99 96*  CO2 27 26 26 26 26   GLUCOSE 172* 180* 129* 128* 122*  BUN 129* 127* 128* 131* 132*  CREATININE 2.83* 3.05* 3.23* 3.28* 3.47*  CALCIUM 8.8* 9.1 9.2 8.3* 8.8*   GFR: Estimated Creatinine Clearance: 10.6 mL/min (A) (by C-G formula based on SCr of 3.47 mg/dL (H)). Liver Function Tests: No results for input(s): AST, ALT, ALKPHOS, BILITOT, PROT, ALBUMIN in the last 168 hours. No results for input(s): LIPASE, AMYLASE in the last 168 hours. No results for input(s): AMMONIA in the last 168 hours. Coagulation Profile: No results for input(s): INR, PROTIME in the last 168  hours. Cardiac Enzymes: Recent Labs  Lab 02/01/18 1150  TROPONINI 0.08*   BNP (last 3 results) No results for input(s): PROBNP in the last 8760 hours. HbA1C: No results for input(s): HGBA1C in the last 72 hours. CBG: Recent Labs  Lab 02/05/18 1941 02/05/18 2309 02/06/18 0343 02/06/18 0736 02/06/18 1203  GLUCAP 98 102* 132* 127* 117*   Lipid Profile: No results for input(s): CHOL, HDL, LDLCALC, TRIG, CHOLHDL, LDLDIRECT in the last 72 hours. Thyroid Function Tests: No results for input(s): TSH, T4TOTAL, FREET4, T3FREE, THYROIDAB in the last 72 hours. Anemia Panel: No results for input(s): VITAMINB12, FOLATE, FERRITIN, TIBC,  IRON, RETICCTPCT in the last 72 hours. Sepsis Labs: No results for input(s): PROCALCITON, LATICACIDVEN in the last 168 hours.  No results found for this or any previous visit (from the past 240 hour(s)).     Radiology Studies: No results found.    Scheduled Meds: . sodium chloride   Intravenous Once  . atorvastatin  40 mg Per Tube q1800  . benztropine  0.5 mg Per Tube Daily  . chlorhexidine  15 mL Mouth Rinse BID  . cholecalciferol  400 Units Per Tube Daily  . clopidogrel  75 mg Per Tube Daily  . collagenase   Topical Daily  . docusate  100 mg Per Tube Daily  . famotidine  10 mg Oral Daily  . feeding supplement (JEVITY 1.2 CAL)  1,000 mL Per Tube Q24H  . ferrous sulfate  300 mg Per Tube Daily  . free water  250 mL Per Tube Q8H  . insulin aspart  0-15 Units Subcutaneous Q4H  . insulin detemir  12 Units Subcutaneous Daily  . isosorbide-hydrALAZINE  0.5 tablet Per Tube TID  . mouth rinse  15 mL Mouth Rinse q12n4p  . metoprolol tartrate  25 mg Per Tube BID  . multivitamin  15 mL Per Tube Daily   Continuous Infusions: . sodium chloride Stopped (01/08/18 2158)     LOS: 59 days    Time spent: 25 minutes   Dessa Phi, DO [facility] [email or web address] Password [facility] 02/06/2018, 12:59 PM

## 2018-02-06 NOTE — Progress Notes (Signed)
CRITICAL VALUE ALERT  Critical Value: Hemaglobin 4.9  Date & Time Notied: 02/06/18 0505  Provider Notified: Vevelyn Royals @ 0510  Orders Received/Actions taken:  Tylene Fantasia ordered for Miss Higham to have 3 units of blood. I went in to start the process and Pt's POA, Mr. Pla is declining at the moment and would like a call from Dr. Hilbert Bible before he makes any further decisions.   Dr. Maylene Roes was notified and spoke with son.

## 2018-02-06 NOTE — Progress Notes (Signed)
Subjective:  Discovered to be significantly anemic this AM but on recheck back to her poor baseline- possible vaginal bleed- otherwise no apparent changes in status - only 425 UOP recorded - 2600 in ?? Another at least 400 in foley bag.  Had low BP in the 90's Objective Vital signs in last 24 hours: Vitals:   02/05/18 2314 02/06/18 0600 02/06/18 0742 02/06/18 1625  BP: (!) 93/56  118/72 (!) 141/90  Pulse: 62  71 74  Resp: 18  (!) 22 20  Temp:   (!) 97.4 F (36.3 C) 97.7 F (36.5 C)  TempSrc:      SpO2: 100%  100% 100%  Weight:  71 kg    Height:       Weight change:   Intake/Output Summary (Last 24 hours) at 02/06/2018 1808 Last data filed at 02/06/2018 1800 Gross per 24 hour  Intake 1413.33 ml  Output 425 ml  Net 988.33 ml    1.  Chronic kidney disease stage III with baseline serum creatinine around 1.5 in December.  2.  Acute kidney injury.  Likely on the basis of sepsis and acute congestive heart failure.  She has had a progressive rise in BUN and creatinine despite adjustments of IV fluids and Lasix.  She has had a prolonged hospitalization complicated by aspiration pneumonia, UTI, and CVA.  Renal ultrasound on 12/27 revealed no evidence of hydronephrosis.  Is not a dialysis candidate given advanced age and multiple comorbidities.  Trending worse instead of better- no other suggestions  3.  Heart failure with reduced ejection fraction.  She had an acute exacerbation several days ago, with chest x-ray revealing pulmonary edema.  She was administered IV Lasix, and fluids were discontinued.  She also has underlying moderate to severe mitral regurgitation.  Holding IV fluids and Lasix since- weight is going up but O2 sat is OK.    Unfortunate situation.  I believe the son has been informed that the patient is not a dialysis candidate- kidney function continues to trend worse, likely hemodynamic insult could very well lead to her demise.  Renal does not have more to offer, will sign off  due to high consult volume at present.  Please call if specific questions   Louis Meckel    Labs: Basic Metabolic Panel: Recent Labs  Lab 02/05/18 1006 02/06/18 0342 02/06/18 0828  NA 136 135 135  K 5.3* 6.1* 5.4*  CL 100 99 96*  CO2 26 26 26   GLUCOSE 129* 128* 122*  BUN 128* 131* 132*  CREATININE 3.23* 3.28* 3.47*  CALCIUM 9.2 8.3* 8.8*   Liver Function Tests: No results for input(s): AST, ALT, ALKPHOS, BILITOT, PROT, ALBUMIN in the last 168 hours. No results for input(s): LIPASE, AMYLASE in the last 168 hours. No results for input(s): AMMONIA in the last 168 hours. CBC: Recent Labs  Lab 02/03/18 0449 02/04/18 0530 02/05/18 1006 02/06/18 0342 02/06/18 0828  WBC 7.7 8.2 7.6 9.2 8.5  HGB 7.2* 7.6* 7.1* 4.9* 7.2*  HCT 24.0* 24.9* 22.6* 15.9* 23.8*  MCV 98.0 97.3 95.8 99.4 97.9  PLT 213 234 218 253 228   Cardiac Enzymes: Recent Labs  Lab 02/01/18 1150  TROPONINI 0.08*   CBG: Recent Labs  Lab 02/05/18 2309 02/06/18 0343 02/06/18 0736 02/06/18 1203 02/06/18 1619  GLUCAP 102* 132* 127* 117* 129*    Iron Studies: No results for input(s): IRON, TIBC, TRANSFERRIN, FERRITIN in the last 72 hours. Studies/Results: Dg Abd 1 View  Result Date: 02/06/2018  CLINICAL DATA:  Constipation, history coronary artery disease, stroke, cardiomyopathy, stage III chronic kidney disease, hypertension, type II diabetes mellitus EXAM: ABDOMEN - 1 VIEW COMPARISON:  01/23/2018 FINDINGS: Gastrostomy tube projects over stomach. Normal bowel gas pattern. No bowel dilatation or bowel wall thickening. Scattered atherosclerotic calcifications. Dextroconvex thoracolumbar scoliosis with degenerative disc and facet disease changes of the lumbar spine. Bones appear demineralized. Degenerative changes of the RIGHT hip joint also noted. Rounded calcifications in the central pelvis question related to degenerated leiomyoma. IMPRESSION: Normal bowel gas pattern. Electronically Signed   By: Lavonia Dana M.D.   On: 02/06/2018 17:31   Medications: Infusions: . sodium chloride Stopped (01/08/18 2158)    Scheduled Medications: . sodium chloride   Intravenous Once  . atorvastatin  40 mg Per Tube q1800  . benztropine  0.5 mg Per Tube Daily  . chlorhexidine  15 mL Mouth Rinse BID  . cholecalciferol  400 Units Per Tube Daily  . clopidogrel  75 mg Per Tube Daily  . collagenase   Topical Daily  . docusate  100 mg Per Tube Daily  . famotidine  10 mg Oral Daily  . feeding supplement (JEVITY 1.2 CAL)  1,000 mL Per Tube Q24H  . ferrous sulfate  300 mg Per Tube Daily  . free water  250 mL Per Tube Q8H  . insulin aspart  0-15 Units Subcutaneous Q4H  . insulin detemir  12 Units Subcutaneous Daily  . isosorbide-hydrALAZINE  0.5 tablet Per Tube TID  . mouth rinse  15 mL Mouth Rinse q12n4p  . metoprolol tartrate  25 mg Per Tube BID  . multivitamin  15 mL Per Tube Daily    have reviewed scheduled and prn medications.  Physical Exam: General: thin- mittens- some simple conversation Heart: RRR Lungs: poor effort, dec BS at bases Abdomen: distended- PEG tube  Extremities: mild pitting edema to dep areas    02/06/2018,6:08 PM  LOS: 59 days

## 2018-02-06 NOTE — Progress Notes (Signed)
Palliative Medicine RN Note: PMT continues to chart check. At this time, there is not a role for further PMT involvement.  Marjie Skiff Aurelie Dicenzo, RN, BSN, [facility] Palliative Medicine Team 02/06/2018 2:56 PM Office [phone]

## 2018-02-07 ENCOUNTER — Inpatient Hospital Stay (HOSPITAL_COMMUNITY): Payer: Medicare Other

## 2018-02-07 DIAGNOSIS — J810 Acute pulmonary edema: Secondary | ICD-10-CM

## 2018-02-07 LAB — CBC WITH DIFFERENTIAL/PLATELET
Abs Immature Granulocytes: 0.02 10*3/uL (ref 0.00–0.07)
Basophils Absolute: 0 10*3/uL (ref 0.0–0.1)
Basophils Relative: 0 %
Eosinophils Absolute: 0.1 10*3/uL (ref 0.0–0.5)
Eosinophils Relative: 1 %
HCT: 23.8 % — ABNORMAL LOW (ref 36.0–46.0)
Hemoglobin: 7.2 g/dL — ABNORMAL LOW (ref 12.0–15.0)
Immature Granulocytes: 0 %
Lymphocytes Relative: 11 %
Lymphs Abs: 0.9 10*3/uL (ref 0.7–4.0)
MCH: 30.1 pg (ref 26.0–34.0)
MCHC: 30.3 g/dL (ref 30.0–36.0)
MCV: 99.6 fL (ref 80.0–100.0)
MONO ABS: 0.9 10*3/uL (ref 0.1–1.0)
Monocytes Relative: 12 %
NEUTROS ABS: 5.7 10*3/uL (ref 1.7–7.7)
Neutrophils Relative %: 76 %
Platelets: 242 10*3/uL (ref 150–400)
RBC: 2.39 MIL/uL — ABNORMAL LOW (ref 3.87–5.11)
RDW: 17.9 % — ABNORMAL HIGH (ref 11.5–15.5)
WBC: 7.6 10*3/uL (ref 4.0–10.5)
nRBC: 0 % (ref 0.0–0.2)

## 2018-02-07 LAB — BASIC METABOLIC PANEL
Anion gap: 10 (ref 5–15)
BUN: 133 mg/dL — AB (ref 8–23)
CO2: 27 mmol/L (ref 22–32)
Calcium: 8.5 mg/dL — ABNORMAL LOW (ref 8.9–10.3)
Chloride: 98 mmol/L (ref 98–111)
Creatinine, Ser: 3.54 mg/dL — ABNORMAL HIGH (ref 0.44–1.00)
GFR calc Af Amer: 13 mL/min — ABNORMAL LOW (ref >60)
GFR calc non Af Amer: 11 mL/min — ABNORMAL LOW (ref >60)
Glucose, Bld: 146 mg/dL — ABNORMAL HIGH (ref 70–99)
Potassium: 5.6 mmol/L — ABNORMAL HIGH (ref 3.5–5.1)
Sodium: 135 mmol/L (ref 135–145)

## 2018-02-07 LAB — GLUCOSE, CAPILLARY
Glucose-Capillary: 111 mg/dL — ABNORMAL HIGH (ref 70–99)
Glucose-Capillary: 112 mg/dL — ABNORMAL HIGH (ref 70–99)
Glucose-Capillary: 132 mg/dL — ABNORMAL HIGH (ref 70–99)
Glucose-Capillary: 141 mg/dL — ABNORMAL HIGH (ref 70–99)
Glucose-Capillary: 167 mg/dL — ABNORMAL HIGH (ref 70–99)
Glucose-Capillary: 91 mg/dL (ref 70–99)
Glucose-Capillary: 92 mg/dL (ref 70–99)
Glucose-Capillary: 94 mg/dL (ref 70–99)

## 2018-02-07 MED ORDER — SODIUM CHLORIDE 0.9 % IV SOLN
INTRAVENOUS | Status: DC
  Administered 2018-02-07: 22:00:00 via INTRAVENOUS

## 2018-02-07 NOTE — Progress Notes (Addendum)
PROGRESS NOTE  [name] JHE:[id number] DOB: 1927/10/17 DOA: 12/05/2017 PCP: Deland Pretty, MD  HPI/Brief Narrative  [name] is a 85 y.o. year old female with medical history significant for CAD s/p CABG, recent ischemic stroke, HTN, DM, HLD, recent failure to thrive due to swallowing problems s/p botox injection for achalasia, who presented on  12/05/2017 for admission due to altered mental status difficulty speaking/aphasia.  Patient has had prolonged hospital course significant for NSTEMI, respiratory failure requiring intubation, UTI/Klebsiella pneumonia, anemia and now worsening renal failure. Subjective Patient has no acute complaints No acute events overnight  Assessment/Plan:  #AKI on CKD stage III, quite elevated BUN and hyperkalemia Baseline creatinine 1.5 (December 2019).  Creatinine trending up still but at a somewhat slower rate, may be reaching a plateau .  Likely mixed etiology related to sepsis from UTI, fluid shifts from aspiration pneumonia and CVA.  No evidence of hydronephrosis on ultrasound from 12/27.  Nephrology has evaluated and patient is not a dialysis candidate given advanced age and multiple comorbidities.  Unfortunately they have signed off as medically there is not much more to do.  Will monitor BMP and avoid nephrotoxins.  Will give Lokelma x1  #Acute hypoxic respiratory failure secondary to CHF with reduced EF (TTE 33% ejection fraction) Difficult to evaluate volume status on examination.  Based off weights patient has gone up during hospital stay.  Chest x-ray today which shows slightly worsening pulmonary consolidation, vascular congestion and moderate pulmonary edema.  I would like to start IV Lasix as this may improve her edema but her son brings up a valid point that in the past she has not tolerated Lasix with worsening kidney function, will hold off on Lasix therapy and try low-dose IV fluids for presumed prerenal etiology of AKI and closely monitor.    Continue BiDil  Acute metabolic encephalopathy, stable Likely multifactorial etiology hospital-acquired delirium, uremia related to AKI on CKD, oxygen requirements.  EEG unremarkable.  Continue Seroquel, delirium precautions  #Dysphasia/moderate malnutrition Recently evaluated by speech recommends continued n.p.o. given poor mental status and unsafe for oral intake.  Continue PEG tube feeding  Vaginal bleeding secondary to uterine fibroma Hemoglobin remained stable, monitor CBC   Hypernatremia, resolved Secondary to free water deficit, resolved with increasing free water via PEG tube  ESBL Klebsiella aspiration pneumonia Enterococcus UTI Completed antibiotic course  NSTEMI, resolved Cardiology recommended medical management only.  Continue atorvastatin Plavix, Lopressor  Left temporal frontal punctate CVA Evaluated by neurology.  No work-up TTE was unremarkable for vegetation or clot.  We will continue Plavix and atorvastatin  Type 2 diabetes A1c 5.9 (12/2017, down from 9 on 10/2017) fasting glucose on 1/8 a.m. 146.  Currently on detemir 12 units daily, continue to closely monitor CBGs.  Tardive dyskinesia Diagnosed this hospitalization (01/18/2018).  Family would like to continue tapering off will discontinue Cogentin today    DVT prophylaxis: Consultants:   Treatment Team:   Claudia Desanctis, MD     Code Status: Full code  Family Communication: Will call son, Dr. Algernon Huxley  Disposition Plan: Continue admission while monitoring kidney function, will likely need Lasix for diuresis given chest x-ray findings but will hold off per family request due to concern for worsening kidney function, very poor prognosis we will continue goals of care discussion with family as well        Objective: Vitals:   02/07/18 0016 02/07/18 0437 02/07/18 0725 02/07/18 1737  BP: 135/87  124/70 (!) 147/79  Pulse: 65  74 74  Resp:   17 17  Temp:    (!) 97.5 F (36.4 C)  TempSrc:     Axillary  SpO2: 99%  100% 100%  Weight:  79 kg    Height:        Intake/Output Summary (Last 24 hours) at 02/07/2018 1758 Last data filed at 02/07/2018 1752 Gross per 24 hour  Intake 1234.66 ml  Output 1000 ml  Net 234.66 ml   Filed Weights   01/31/18 0500 02/06/18 0600 02/07/18 0437  Weight: 67 kg 71 kg 79 kg    Exam:  Constitutional: Elderly female, chronically ill-appearing, no distress Eyes: EOMI, anicteric ENMT: Oropharynx with moist mucous membranes Cardiovascular: RRR no MRGs, with no peripheral edema Respiratory: Normal respiratory effort, clear breath sounds but difficult to auscultate given positioning in bed Abdomen: Soft,non-tender,  Skin: No rash ulcers, or lesions. Without skin tenting  Neurologic: Alert to voice, not speaking, not following commands, randomly moving right arm above and below head   Data Reviewed: CBC: Recent Labs  Lab 02/04/18 0530 02/05/18 1006 02/06/18 0342 02/06/18 0828 02/07/18 0616  WBC 8.2 7.6 9.2 8.5 7.6  NEUTROABS  --   --   --   --  5.7  HGB 7.6* 7.1* 4.9* 7.2* 7.2*  HCT 24.9* 22.6* 15.9* 23.8* 23.8*  MCV 97.3 95.8 99.4 97.9 99.6  PLT 234 218 253 228 676   Basic Metabolic Panel: Recent Labs  Lab 02/04/18 0530 02/05/18 1006 02/06/18 0342 02/06/18 0828 02/07/18 0616  NA 136 136 135 135 135  K 5.4* 5.3* 6.1* 5.4* 5.6*  CL 98 100 99 96* 98  CO2 26 26 26 26 27   GLUCOSE 180* 129* 128* 122* 146*  BUN 127* 128* 131* 132* 133*  CREATININE 3.05* 3.23* 3.28* 3.47* 3.54*  CALCIUM 9.1 9.2 8.3* 8.8* 8.5*   GFR: Estimated Creatinine Clearance: 10.9 mL/min (A) (by C-G formula based on SCr of 3.54 mg/dL (H)). Liver Function Tests: No results for input(s): AST, ALT, ALKPHOS, BILITOT, PROT, ALBUMIN in the last 168 hours. No results for input(s): LIPASE, AMYLASE in the last 168 hours. No results for input(s): AMMONIA in the last 168 hours. Coagulation Profile: No results for input(s): INR, PROTIME in the last 168 hours. Cardiac  Enzymes: Recent Labs  Lab 02/01/18 1150  TROPONINI 0.08*   BNP (last 3 results) No results for input(s): PROBNP in the last 8760 hours. HbA1C: No results for input(s): HGBA1C in the last 72 hours. CBG: Recent Labs  Lab 02/07/18 0358 02/07/18 0724 02/07/18 1142 02/07/18 1717 02/07/18 1736  GLUCAP 132* 141* 167* 92 91   Lipid Profile: No results for input(s): CHOL, HDL, LDLCALC, TRIG, CHOLHDL, LDLDIRECT in the last 72 hours. Thyroid Function Tests: No results for input(s): TSH, T4TOTAL, FREET4, T3FREE, THYROIDAB in the last 72 hours. Anemia Panel: No results for input(s): VITAMINB12, FOLATE, FERRITIN, TIBC, IRON, RETICCTPCT in the last 72 hours. Urine analysis:    Component Value Date/Time   COLORURINE YELLOW 12/23/2017 1324   APPEARANCEUR CLOUDY (A) 12/23/2017 1324   LABSPEC 1.010 12/23/2017 1324   PHURINE 7.0 12/23/2017 1324   GLUCOSEU NEGATIVE 12/23/2017 1324   HGBUR MODERATE (A) 12/23/2017 1324   BILIRUBINUR NEGATIVE 12/23/2017 1324   KETONESUR NEGATIVE 12/23/2017 1324   PROTEINUR 100 (A) 12/23/2017 1324   UROBILINOGEN 0.2 06/30/2011 1214   NITRITE NEGATIVE 12/23/2017 1324   LEUKOCYTESUR LARGE (A) 12/23/2017 1324   Sepsis Labs: @LABRCNTIP (procalcitonin:4,lacticidven:4)  )No results found for this or any previous visit (from  the past 240 hour(s)).    Studies: Dg Chest Port 1 View  Result Date: 02/07/2018 CLINICAL DATA:  Dyspnea EXAM: PORTABLE CHEST 1 VIEW COMPARISON:  02/01/2018, 01/13/2018 FINDINGS: Cardiomegaly with vascular congestion and moderate pulmonary edema. Likely layering bilateral pleural effusions. Slight worsening of dense bibasilar airspace disease. Right upper extremity catheter tip overlies the SVC origin. Aortic atherosclerosis. No pneumothorax. IMPRESSION: 1. Cardiomegaly with vascular congestion and continued moderate pulmonary edema. 2. Layering pleural effusions, right greater than left, likely attributing to overall hazy appearance of the lung  fields. Worsening consolidation at both bases. Electronically Signed   By: Donavan Foil M.D.   On: 02/07/2018 17:05    Scheduled Meds: . sodium chloride   Intravenous Once  . atorvastatin  40 mg Per Tube q1800  . benztropine  0.5 mg Per Tube Daily  . chlorhexidine  15 mL Mouth Rinse BID  . cholecalciferol  400 Units Per Tube Daily  . clopidogrel  75 mg Per Tube Daily  . collagenase   Topical Daily  . docusate  100 mg Per Tube Daily  . famotidine  10 mg Oral Daily  . feeding supplement (JEVITY 1.2 CAL)  1,000 mL Per Tube Q24H  . ferrous sulfate  300 mg Per Tube Daily  . free water  250 mL Per Tube Q8H  . insulin aspart  0-15 Units Subcutaneous Q4H  . insulin detemir  12 Units Subcutaneous Daily  . isosorbide-hydrALAZINE  0.5 tablet Per Tube TID  . mouth rinse  15 mL Mouth Rinse q12n4p  . metoprolol tartrate  25 mg Per Tube BID  . multivitamin  15 mL Per Tube Daily    Continuous Infusions: . sodium chloride Stopped (01/08/18 2158)     LOS: 60 days     Desiree Hane, MD [facility] Pager [phone]  If 7PM-7AM, please contact night-coverage [email or web address] Password [facility] 02/07/2018, 5:58 PM

## 2018-02-07 NOTE — Progress Notes (Signed)
  Speech Language Pathology Treatment: Dysphagia  Patient Details Name: [name] MRN: [id number] DOB: Mar 13, 1927 Today's Date: 02/07/2018 Time: 4503-8882 SLP Time Calculation (min) (ACUTE ONLY): 30 min  Assessment / Plan / Recommendation Clinical Impression  Patient seen for therapeutic po trials. Patient alert however continues to require max cues for sustained attention to task. Demonstrating improved bolus awareness, opening oral cavity in oral acceptance of ice chips and small sips of thin liquids and independently wiping chin post anterior labial spillage. Patient however continues to present with significant oral transit delays, at times in excess of 3 minutes, prior to swallow initiation which overall appears strong based on palpation. No overt indication of aspiration observed. Extensive education complete regarding patient's performance and prognosis, which at this time relies largely on mental status followed by esophageal function. SLP will continue to f/u as patient is demonstrating progress today as compared to previous sessions.   MD, observed what appears to be mild thrush lingually. RN made aware. Please address. Thank you.   HPI HPI: 85 y.o. female with CAD s/p CABG, recent ischemic stroke, HTN, DM, HLP, recent failure to thrive due to swallowing problems s/p botox injection for achalasia, developed acute pulmonary edema and NSTEMI on 12/20/2017. Bedside swallow on 11/05/17 revealed functional oropharyngeal swallow and suspected esophageal dysphagia. Had esophagram 11/06/17 which showed moderately large hiatal hernia with poor motility and retained barium throughout the esophagus. PEG 12/10/17.  Repeat bedside swallow eval 12/23/17 with recs for NPO secondary to poor MS, no oral anticipation/recognition of POs. Pt with no change in MS that would allow safe oral intake between 11/23 and date of intubation,12/2.  Extubated 12/6. New orders for repeat bedside swallow eval s/p extubation.      SLP Plan  Continue with current plan of care       Recommendations  Diet recommendations: NPO Medication Administration: Via alternative means                Oral Care Recommendations: Oral care QID Follow up Recommendations: Skilled Nursing facility SLP Visit Diagnosis: Dysphagia, unspecified (R13.10) Plan: Continue with current plan of care       GO             Berlin Mokry MA, CCC-SLP     Myla Mauriello Meryl 02/07/2018, 11:27 AM

## 2018-02-07 NOTE — Progress Notes (Signed)
Family wanted to question pts cogentin to titrate down before giving it.   1600- spoke back with MD and she plans to DC not to give today. I also requested again that she call and give family updates.

## 2018-02-07 NOTE — Progress Notes (Signed)
Spoke with patients son about possibility of removing foley catheter today and he stated that he would "AMA that order" that he has decided she would go home with a foley so we were not allowed to remove it. He understands the infection risks and that it is up to our medical doctor.

## 2018-02-08 LAB — BASIC METABOLIC PANEL
Anion gap: 10 (ref 5–15)
BUN: 127 mg/dL — ABNORMAL HIGH (ref 8–23)
CO2: 25 mmol/L (ref 22–32)
Calcium: 7.8 mg/dL — ABNORMAL LOW (ref 8.9–10.3)
Chloride: 99 mmol/L (ref 98–111)
Creatinine, Ser: 3.54 mg/dL — ABNORMAL HIGH (ref 0.44–1.00)
GFR calc non Af Amer: 11 mL/min — ABNORMAL LOW (ref >60)
GFR, EST AFRICAN AMERICAN: 13 mL/min — AB (ref >60)
Glucose, Bld: 163 mg/dL — ABNORMAL HIGH (ref 70–99)
Potassium: 5 mmol/L (ref 3.5–5.1)
Sodium: 134 mmol/L — ABNORMAL LOW (ref 135–145)

## 2018-02-08 LAB — CBC
HCT: 22 % — ABNORMAL LOW (ref 36.0–46.0)
HCT: 24.8 % — ABNORMAL LOW (ref 36.0–46.0)
Hemoglobin: 6.6 g/dL — CL (ref 12.0–15.0)
Hemoglobin: 7.5 g/dL — ABNORMAL LOW (ref 12.0–15.0)
MCH: 29.3 pg (ref 26.0–34.0)
MCH: 29.5 pg (ref 26.0–34.0)
MCHC: 30 g/dL (ref 30.0–36.0)
MCHC: 30.2 g/dL (ref 30.0–36.0)
MCV: 97.8 fL (ref 80.0–100.0)
MCV: 97.6 fL (ref 80.0–100.0)
NRBC: 0 % (ref 0.0–0.2)
Platelets: 208 10*3/uL (ref 150–400)
Platelets: 230 10*3/uL (ref 150–400)
RBC: 2.25 MIL/uL — ABNORMAL LOW (ref 3.87–5.11)
RBC: 2.54 MIL/uL — ABNORMAL LOW (ref 3.87–5.11)
RDW: 17.5 % — ABNORMAL HIGH (ref 11.5–15.5)
RDW: 17.6 % — ABNORMAL HIGH (ref 11.5–15.5)
WBC: 6.7 10*3/uL (ref 4.0–10.5)
WBC: 7.7 10*3/uL (ref 4.0–10.5)
nRBC: 0 % (ref 0.0–0.2)

## 2018-02-08 LAB — GLUCOSE, CAPILLARY
Glucose-Capillary: 129 mg/dL — ABNORMAL HIGH (ref 70–99)
Glucose-Capillary: 138 mg/dL — ABNORMAL HIGH (ref 70–99)
Glucose-Capillary: 106 mg/dL — ABNORMAL HIGH (ref 70–99)
Glucose-Capillary: 71 mg/dL (ref 70–99)
Glucose-Capillary: 72 mg/dL (ref 70–99)
Glucose-Capillary: 76 mg/dL (ref 70–99)

## 2018-02-08 NOTE — Progress Notes (Signed)
  Speech Language Pathology Treatment: Dysphagia  Patient Details Name: [name] MRN: [id number] DOB: 11/18/1927 Today's Date: 02/08/2018 Time: 2440-1027 SLP Time Calculation (min) (ACUTE ONLY): 20 min  Assessment / Plan / Recommendation Clinical Impression  Pt demonstrated severe distractibility, intermittent awareness of PO with max tactile cues. Able to provide 2 bites of puree with oral holding, sips of water from straw, dripping into mouth. Pts swallow response is intermittently swift and appropriate. Unable to advance diet however given severe cognitive impairment and variable function. Will follow for readiness to advance further.   HPI HPI: 85 y.o. female with CAD s/p CABG, recent ischemic stroke, HTN, DM, HLP, recent failure to thrive due to swallowing problems s/p botox injection for achalasia, developed acute pulmonary edema and NSTEMI on 12/20/2017. Bedside swallow on 11/05/17 revealed functional oropharyngeal swallow and suspected esophageal dysphagia. Had esophagram 11/06/17 which showed moderately large hiatal hernia with poor motility and retained barium throughout the esophagus. PEG 12/10/17.  Repeat bedside swallow eval 12/23/17 with recs for NPO secondary to poor MS, no oral anticipation/recognition of POs. Pt with no change in MS that would allow safe oral intake between 11/23 and date of intubation,12/2.  Extubated 12/6. New orders for repeat bedside swallow eval s/p extubation.      SLP Plan  Continue with current plan of care       Recommendations  Diet recommendations: NPO Medication Administration: Via alternative means                Oral Care Recommendations: Oral care QID Follow up Recommendations: Skilled Nursing facility SLP Visit Diagnosis: Dysphagia, unspecified (R13.10) Plan: Continue with current plan of care       GO               Herbie Baltimore, MA [place] Pager [phone] Office [phone]  Lynann Beaver 02/08/2018, 1:08 PM

## 2018-02-08 NOTE — Progress Notes (Signed)
PROGRESS NOTE  [name] RCV:[id number] DOB: 1927/12/27 DOA: 12/05/2017 PCP: Deland Pretty, MD  HPI/Brief Narrative  [name] is a 85 y.o. year old female with medical history significant for CAD s/p CABG, recent ischemic stroke, HTN, DM, HLD, recent failure to thrive due to swallowing problems s/p botox injection for achalasia, who presented on  12/05/2017 for admission due to altered mental status difficulty speaking/aphasia.  Patient has had prolonged hospital course significant for NSTEMI, respiratory failure requiring intubation, UTI/Klebsiella pneumonia, anemia and now worsening renal failure. Subjective Patient has no acute complaints No acute events overnight  Assessment/Plan:  #AKI on CKD stage III suspect related to fluid shifts from recent infections, CHF exacerbations, aspiration pneumonia CVA, quite elevated BUN and hyperkalemia (resolved) Baseline creatinine 1.5 (December 2019).  Creatinine creatinine seems to have stabilized at 3.54, patient is still having quite elevated BUN at 127, likely having confusion related to uremia.  Discussed with son nephrology recommendation the patient is not a dialysis candidate due to age and multiple comorbidities he disagrees and states at a tertiary care center they would be amenable to HD or PD.  Given prolonged hospital stay and minimal improvement in patient's clinical status will consult ethics, will continue goals of care discussions with son   #Acute hypoxic respiratory failure secondary to CHF with reduced EF (TTE 33% ejection fraction) Difficult to evaluate volume status on examination does seem to have a bit more volume in lower extremities on today, last x-ray did show pulmonary edema slightly worse from prior..  Weights in chart showed down 2 pounds.  Will discontinue IV fluids and continue to monitor.  Son does not want to pursue Lasix.  We will continue goals of care discussion as mentioned above..  Based off weights patient has  gone up during hospital stay.  Continue BiDil  Acute metabolic encephalopathy, stable Likely multifactorial etiology hospital-acquired delirium, uremia related to AKI on CKD, oxygen requirements.  EEG unremarkable.  Continue Seroquel, delirium precautions  #Dysphasia/moderate malnutrition Recently evaluated by speech recommends continued n.p.o. given poor mental status and unsafe for oral intake.  Continue PEG tube feeding, would like to increase to appropriate rate to meet nutritional needs will discuss with son  Vaginal bleeding secondary to uterine fibroma Hemoglobin remained stable, monitor CBC   Hypernatremia, resolved Secondary to free water deficit, resolved with increasing free water via PEG tube  ESBL Klebsiella aspiration pneumonia Enterococcus UTI Completed antibiotic course  NSTEMI, resolved Cardiology recommended medical management only.  Continue atorvastatin Plavix, Lopressor  Left temporal frontal punctate CVA Evaluated by neurology.  No work-up TTE was unremarkable for vegetation or clot.  We will continue Plavix and atorvastatin  Type 2 diabetes A1c 5.9 (12/2017, down from 9 on 10/2017) fasting glucose on 1/8 a.m. 146.  Currently on detemir 12 units daily, continue to closely monitor CBGs.  Tardive dyskinesia Diagnosed this hospitalization (01/18/2018).  Family would like to continue tapering off will discontinue Cogentin today    DVT prophylaxis: Consultants:  Treatment Team:   Claudia Desanctis, MD     Code Status: Full code  Family Communication: Will call son, Dr. Algernon Huxley  Disposition PlanGiven prolonged hospital stay and minimal improvement in patient's clinical status will consult ethics, will continue goals of care discussions with son, very poor prognosis        Objective: Vitals:   02/07/18 2328 02/08/18 0450 02/08/18 0851 02/08/18 1522  BP: 111/65  (!) 108/51 125/69  Pulse: 64  69 74  Resp: 18  16  12  Temp: 98.2 F (36.8 C)  97.9 F  (36.6 C) 97.8 F (36.6 C)  TempSrc:   Oral Oral  SpO2: 100%  100% 100%  Weight:  78.3 kg    Height:        Intake/Output Summary (Last 24 hours) at 02/08/2018 1906 Last data filed at 02/08/2018 4709 Gross per 24 hour  Intake 518.89 ml  Output 275 ml  Net 243.89 ml   Filed Weights   02/06/18 0600 02/07/18 0437 02/08/18 0450  Weight: 71 kg 79 kg 78.3 kg    Exam:  Constitutional: Elderly female, chronically ill-appearing, no distress Eyes: EOMI, anicteric ENMT: Oropharynx with moist mucous membranes Cardiovascular: RRR no MRGs, with no peripheral edema Respiratory: Normal respiratory effort, clear breath sounds but difficult to auscultate given positioning in bed Abdomen: Soft,non-tender,  Skin: No rash ulcers, or lesions. Without skin tenting  Neurologic: Alert to voice, answering yes or no questions but upon further question grunts and remains silent, not following commands, randomly moving right arm above and below head   Data Reviewed: CBC: Recent Labs  Lab 02/06/18 0342 02/06/18 0828 02/07/18 0616 02/08/18 1016 02/08/18 1131  WBC 9.2 8.5 7.6 6.7 7.7  NEUTROABS  --   --  5.7  --   --   HGB 4.9* 7.2* 7.2* 6.6* 7.5*  HCT 15.9* 23.8* 23.8* 22.0* 24.8*  MCV 99.4 97.9 99.6 97.8 97.6  PLT 253 228 242 208 628   Basic Metabolic Panel: Recent Labs  Lab 02/05/18 1006 02/06/18 0342 02/06/18 0828 02/07/18 0616 02/08/18 1016  NA 136 135 135 135 134*  K 5.3* 6.1* 5.4* 5.6* 5.0  CL 100 99 96* 98 99  CO2 26 26 26 27 25   GLUCOSE 129* 128* 122* 146* 163*  BUN 128* 131* 132* 133* 127*  CREATININE 3.23* 3.28* 3.47* 3.54* 3.54*  CALCIUM 9.2 8.3* 8.8* 8.5* 7.8*   GFR: Estimated Creatinine Clearance: 10.9 mL/min (A) (by C-G formula based on SCr of 3.54 mg/dL (H)). Liver Function Tests: No results for input(s): AST, ALT, ALKPHOS, BILITOT, PROT, ALBUMIN in the last 168 hours. No results for input(s): LIPASE, AMYLASE in the last 168 hours. No results for input(s): AMMONIA in  the last 168 hours. Coagulation Profile: No results for input(s): INR, PROTIME in the last 168 hours. Cardiac Enzymes: No results for input(s): CKTOTAL, CKMB, CKMBINDEX, TROPONINI in the last 168 hours. BNP (last 3 results) No results for input(s): PROBNP in the last 8760 hours. HbA1C: No results for input(s): HGBA1C in the last 72 hours. CBG: Recent Labs  Lab 02/07/18 2329 02/08/18 0353 02/08/18 0848 02/08/18 1232 02/08/18 1519  GLUCAP 112* 129* 138* 106* 71   Lipid Profile: No results for input(s): CHOL, HDL, LDLCALC, TRIG, CHOLHDL, LDLDIRECT in the last 72 hours. Thyroid Function Tests: No results for input(s): TSH, T4TOTAL, FREET4, T3FREE, THYROIDAB in the last 72 hours. Anemia Panel: No results for input(s): VITAMINB12, FOLATE, FERRITIN, TIBC, IRON, RETICCTPCT in the last 72 hours. Urine analysis:    Component Value Date/Time   COLORURINE YELLOW 12/23/2017 1324   APPEARANCEUR CLOUDY (A) 12/23/2017 1324   LABSPEC 1.010 12/23/2017 1324   PHURINE 7.0 12/23/2017 1324   GLUCOSEU NEGATIVE 12/23/2017 1324   HGBUR MODERATE (A) 12/23/2017 1324   BILIRUBINUR NEGATIVE 12/23/2017 1324   KETONESUR NEGATIVE 12/23/2017 1324   PROTEINUR 100 (A) 12/23/2017 1324   UROBILINOGEN 0.2 06/30/2011 1214   NITRITE NEGATIVE 12/23/2017 1324   LEUKOCYTESUR LARGE (A) 12/23/2017 1324   Sepsis Labs: @LABRCNTIP (procalcitonin:4,lacticidven:4)  )  No results found for this or any previous visit (from the past 240 hour(s)).    Studies: No results found.  Scheduled Meds: . atorvastatin  40 mg Per Tube q1800  . chlorhexidine  15 mL Mouth Rinse BID  . cholecalciferol  400 Units Per Tube Daily  . clopidogrel  75 mg Per Tube Daily  . collagenase   Topical Daily  . docusate  100 mg Per Tube Daily  . famotidine  10 mg Oral Daily  . feeding supplement (JEVITY 1.2 CAL)  1,000 mL Per Tube Q24H  . ferrous sulfate  300 mg Per Tube Daily  . free water  250 mL Per Tube Q8H  . insulin aspart  0-15 Units  Subcutaneous Q4H  . insulin detemir  12 Units Subcutaneous Daily  . isosorbide-hydrALAZINE  0.5 tablet Per Tube TID  . mouth rinse  15 mL Mouth Rinse q12n4p  . metoprolol tartrate  25 mg Per Tube BID  . multivitamin  15 mL Per Tube Daily    Continuous Infusions: . sodium chloride Stopped (01/08/18 2158)     LOS: 61 days     Desiree Hane, MD [facility] Pager [phone]  If 7PM-7AM, please contact night-coverage [email or web address] Password [email or web address] 02/08/2018, 7:06 PM

## 2018-02-08 NOTE — Progress Notes (Signed)
Nutrition Follow-up  DOCUMENTATION CODES:   Non-severe (moderate) malnutrition in context of chronic illness, Underweight  INTERVENTION:    Recommend increasing Jevity 1.2 to goal rate of 55 ml/hr  Provides 1584 kcals, 73 gm protein, 1065 ml of free water daily   Continue liquid MVI daily via tube  NUTRITION DIAGNOSIS:   Moderate Malnutrition related to chronic illness(dysphagia 2/2 to achalasia) as evidenced by energy intake < or equal to 75% for > or equal to 1 month, mild fat depletion, moderate fat depletion, mild muscle depletion, moderate muscle depletion, energy intake < or equal to 50% for > or equal to 1 month, ongoing  GOAL:   Patient will meet greater than or equal to 90% of their needs, unmet  MONITOR:   TF tolerance, Labs, Skin, Weight trends, I & O's  ASSESSMENT:   [name] is a 85 y.o. female with medical history significant for DM2, HTN, HLD, CAD s/p CABG, h/o CVA, CKD3, who was recently diagnosed with achalasia after a 50 lb weight loss over the past year. She was treated with botox earlier in October and sent to SNF with the hopes she would be able to swallow her food. She has not done well since that time, has only been able to drink small amounts of milk. Son is a Management consultant in [place] and has been visiting. Yesterday he noticed she was stuttering, having difficulty getting words out, and progressed until she seemed to have total aphasia last night. She does report having dysuria and some lower abd pain. No N/V. Has a good appetite but avoids food because of the obstruction. Megace and remeron have not helped. Process of being worked up for G tube has been initiated and pt reportedly has appt with [facility] tomorrow morning. Son feels like her weight loss and FTT could be reversible if she gets a G tube as she was reportedly very functional a month ago, walking with a walker and performing all of her ADLs.   11/7 - per IR notes, imaging revealed  moderate sized hiatal hernia and colon in upper abdomen, recommending evaluating anatomy with fluoroscopy 11/8 - PICC placed 11/10 - TPN started, IR placed G-tube 11/11 - G-tube cleared for use by RI, TF initiated 11/13 - TPN d/c 11/20 - transitioned to bolus feedings 11/23 - s/p BSE, recommend continued NPO due to AMS, emesis with bolus feedings, rapid response called 11/25 - rapid response, tube feeds held and restarted in the afternoon at 20 ml/hr 11/27 - per MD, tube feeds at 35 ml/hr during the daytime and 30 ml/hr overnight 12/1 - rapid response for increased WOB 12/2 - emesis, TF turned off, intubated 12/6 - extubated, BiPAP at night  Current TF regimen: Jevity 1.2 formula at goal rate of 40 ml/hr via PEG tube. RD decreased TF rate from 55 ml/hr to 40 ml/hr per pt's son's request 12/31. Nephrology has signed off; pt's BUN continues to remain stable at this time.  Pt is not meeting her estimated nutrition needs. RD recommends increasing TF back to previous goal rate. Case discussed with Dr. Lonny Prude who will speak with pt's son.  Free water flushes at 250 ml every 8 hours. Labs & medications reviewed. Na 134 (L). CBG's [id number].  Pt's average weight from 01/11/18 to 01/27/18 is 58 kg (prior to increase trend from fluid).  Diet Order:   Diet Order            Diet NPO time specified  Diet effective now  EDUCATION NEEDS:   Not appropriate for education at this time  Skin:  Skin Assessment: Skin Integrity Issues: Skin Integrity Issues:: Stage II Stage II: left buttocks, sacrum Other: partial thickness wounds to bilateral buttocks (likely from adhesive)  Last BM:  1/4    Intake/Output Summary (Last 24 hours) at 02/08/2018 1609 Last data filed at 02/08/2018 0644 Gross per 24 hour  Intake 913.55 ml  Output 925 ml  Net -11.45 ml   Height:   Ht Readings from Last 1 Encounters:  12/20/17 5\' 3"  (1.6 m)   Weight:   Wt Readings from Last 1 Encounters:   02/08/18 78.3 kg   Ideal Body Weight:  52.3 kg  BMI:  Body mass index is 30.58 kg/m.highly skewed given fluid  Estimated Nutritional Needs (using 58 kg):   Kcal:  1450-1650  Protein:  75-90 gm  Fluid:  >/= 1.5 L  Arthur Holms, RD, LDN Pager #: [phone] After-Hours Pager #: [phone]

## 2018-02-09 ENCOUNTER — Inpatient Hospital Stay (HOSPITAL_COMMUNITY): Payer: Medicare Other

## 2018-02-09 DIAGNOSIS — J90 Pleural effusion, not elsewhere classified: Secondary | ICD-10-CM

## 2018-02-09 DIAGNOSIS — R599 Enlarged lymph nodes, unspecified: Secondary | ICD-10-CM

## 2018-02-09 LAB — CBC
HEMATOCRIT: 24.3 % — AB (ref 36.0–46.0)
Hemoglobin: 7.4 g/dL — ABNORMAL LOW (ref 12.0–15.0)
MCH: 29.4 pg (ref 26.0–34.0)
MCHC: 30.5 g/dL (ref 30.0–36.0)
MCV: 96.4 fL (ref 80.0–100.0)
Platelets: 226 10*3/uL (ref 150–400)
RBC: 2.52 MIL/uL — ABNORMAL LOW (ref 3.87–5.11)
RDW: 17.6 % — ABNORMAL HIGH (ref 11.5–15.5)
WBC: 7.9 10*3/uL (ref 4.0–10.5)
nRBC: 0 % (ref 0.0–0.2)

## 2018-02-09 LAB — GLUCOSE, CAPILLARY
Glucose-Capillary: 92 mg/dL (ref 70–99)
Glucose-Capillary: 123 mg/dL — ABNORMAL HIGH (ref 70–99)
Glucose-Capillary: 161 mg/dL — ABNORMAL HIGH (ref 70–99)
Glucose-Capillary: 127 mg/dL — ABNORMAL HIGH (ref 70–99)
Glucose-Capillary: 95 mg/dL (ref 70–99)

## 2018-02-09 LAB — BASIC METABOLIC PANEL
Anion gap: 12 (ref 5–15)
BUN: 129 mg/dL — AB (ref 8–23)
CO2: 25 mmol/L (ref 22–32)
Calcium: 8.4 mg/dL — ABNORMAL LOW (ref 8.9–10.3)
Chloride: 98 mmol/L (ref 98–111)
Creatinine, Ser: 3.7 mg/dL — ABNORMAL HIGH (ref 0.44–1.00)
GFR calc Af Amer: 12 mL/min — ABNORMAL LOW (ref >60)
GFR calc non Af Amer: 10 mL/min — ABNORMAL LOW (ref >60)
Glucose, Bld: 120 mg/dL — ABNORMAL HIGH (ref 70–99)
Potassium: 5.3 mmol/L — ABNORMAL HIGH (ref 3.5–5.1)
Sodium: 135 mmol/L (ref 135–145)

## 2018-02-09 NOTE — Progress Notes (Signed)
CC: Possible incarcerated hernia  Subjective: Patient is a very elderly and frail woman who was admitted on 11/04/2017.  If I am reading this correctly she has not been discharged.  Her current length of stay is 62 days.  We saw her on 12/19/2017, for a possible incarcerated hernia.  She was found to have a hard and tender nodule near the umbilicus.  On exam she had a soft, reducible nontender side of the umbilicus.  A CT scan from 10/2017 showed a lymph node with fat containing umbilical hernia consistent with a possible Sister Wynona Dove nodule.  Her son who is a Estate agent said that has been there for some time.  There is no sign of incarcerated hernia and no need for surgical intervention.  This was evaluated by Dr. Donnie Mesa at the time.  States she was seen by Dr. Lonny Prude for the first time.  The son voiced some concerns that this was different and we were asked to see.  View of the chart shows the patient is afebrile and vital signs are stable.  Currently the patient is n.p.o. labs show a sodium of 135, potassium of 5.3, BUN 129, creatinine 3.70, BBC 7.9, hemoglobin 7.4, hematocrit 24.3.  Platelets 226K.  Abdominal films on 02/06/2018, shows a G-tube, normal bowel gas pattern no bowel dilatation or bowel wall thickening.  Thoracolumbar scoliosis with degenerative changes.  CXR 1/8: Shows cardiomegaly with vascular congestion and continued moderate pulmonary edema.  Layering fluid/pleural effusion right greater than left likely attributed to overall haziness consistent with worsening consolidation at both bases.  CT of the abdomen pelvis 11/04/2017: Shows no acute findings.  Evidence for anemia, and the lymph node with fat-containing umbilical hernia in the absence of other lymph nodes/lymphadenopathy this may be reactive however noted in this site has been associated with GI/cancer abdominal malignancies Sister Wynona Dove nodule.  Also a cystic lesion in the left adnexa measuring 2.3  cm.  Gastric emptying study on 11/08/2017 ": Shows minimally recent gastric emptying.  Upper GI series with KUB on 11/09/2017 : obstruction of the esophagus at the GE junction intermittent high-grade partial stricturing.  Differential includes benign and malignant stricturing and spasm of the GE junction.  Significant retention of oral contrast within the esophagus during the entirety of the exam no gross evidence of mass in the stomach on limited exam.  On 12/10/2017 IR placed a gastrostomy feeding tube.  Tube feeds currently at 40 mils per hour   Objective: Vital signs in last 24 hours: Temp:  [97.8 F (36.6 C)-98.2 F (36.8 C)] 97.8 F (36.6 C) (01/10 0403) Pulse Rate:  [69-76] 76 (01/10 0813) Resp:  [12-18] 18 (01/10 0813) BP: (110-140)/(55-76) 125/76 (01/10 0813) SpO2:  [100 %] 100 % (01/10 0813) Weight:  [71.3 kg] 71.3 kg (01/10 0500) Last BM Date: 02/08/17 . sodium chloride Stopped (01/08/18 2158)   . sodium chloride Stopped (01/08/18 2158)    Intake/Output from previous day: 01/09 0701 - 01/10 0700 In: -  Out: 300 [Urine:300] Intake/Output this shift: No intake/output data recorded.  Gen: alert, NAD Cardio: RRR Pulm: CTAB, no wheezing or rhonchi GI: G-tube site clean, TF running at 82mL/hr, abdomen soft, ND, NT, +BS, palpable lymph node near umbilicus Msk: 2+ edema BLE  Lab Results:  Recent Labs    02/08/18 1131 02/09/18 0641  WBC 7.7 7.9  HGB 7.5* 7.4*  HCT 24.8* 24.3*  PLT 230 226    BMET Recent Labs    02/08/18 1016  02/09/18 0641  NA 134* 135  K 5.0 5.3*  CL 99 98  CO2 25 25  GLUCOSE 163* 120*  BUN 127* 129*  CREATININE 3.54* 3.70*  CALCIUM 7.8* 8.4*   PT/INR No results for input(s): LABPROT, INR in the last 72 hours.  No results for input(s): AST, ALT, ALKPHOS, BILITOT, PROT, ALBUMIN in the last 168 hours.   Lipase     Component Value Date/Time   LIPASE 33 11/04/2017 1110     Medications: . atorvastatin  40 mg Per Tube q1800  .  chlorhexidine  15 mL Mouth Rinse BID  . cholecalciferol  400 Units Per Tube Daily  . clopidogrel  75 mg Per Tube Daily  . collagenase   Topical Daily  . docusate  100 mg Per Tube Daily  . famotidine  10 mg Oral Daily  . feeding supplement (JEVITY 1.2 CAL)  1,000 mL Per Tube Q24H  . ferrous sulfate  300 mg Per Tube Daily  . free water  250 mL Per Tube Q8H  . insulin aspart  0-15 Units Subcutaneous Q4H  . insulin detemir  12 Units Subcutaneous Daily  . isosorbide-hydrALAZINE  0.5 tablet Per Tube TID  . mouth rinse  15 mL Mouth Rinse q12n4p  . metoprolol tartrate  25 mg Per Tube BID  . multivitamin  15 mL Per Tube Daily    Assessment/Plan  Questionable incarcerated hernia Multiple medical issues including: Acute kidney injury -creatinine 3.70 Hx CHF -EF 33%. Acute hypoxic respiratory failure Cute metabolic encephalopathy Dysphasia Vaginal bleeding secondary to uterine fibroids ESBL -Klebsiella aspiration pneumonia -enterococcus UTI -antibiotics completed NSTEMI -resolved Left temporoparietal punctate CVA Tar dive dyskinesia  Plan: We will repeat the CT scan today with some oral contrast for the gastrostomy tube.  No IV contrast secondary to the renal dysfunction.  We will follow with you.      LOS: 62 days    [name] 02/09/2018 [phone]

## 2018-02-09 NOTE — Progress Notes (Addendum)
PROGRESS NOTE  [name] BLT:[id number] DOB: 10/26/1927 DOA: 12/05/2017 PCP: Deland Pretty, MD  HPI/Brief Narrative  [name] is a 85 y.o. year old female with medical history significant for CAD s/p CABG, recent ischemic stroke, HTN, DM, HLD, recent failure to thrive due to swallowing problems s/p botox injection for achalasia, who presented on  12/05/2017 for admission due to altered mental status difficulty speaking/aphasia.  Patient has had prolonged hospital course significant for NSTEMI, respiratory failure requiring intubation, UTI/Klebsiella pneumonia, anemia and now worsening renal failure.  Subjective Continues to cry intermittently throughout exam No acute complaints Son at bedside concerned about hard mass near umbilical area  Assessment/Plan:  #Concern for umbilical hernia with potential incarceration Surgery consulted and recommended CT scan which does not show incarcerated hernia but more consistent with lymph node that is increased in size.  Discussed with son potential for possible malignancy, he favors continuing to monitor and not proceeding with a potential core biopsy  #AKI on CKD stage III suspect related to fluid shifts from recent infections, CHF exacerbations, aspiration pneumonia CVA, quite elevated BUN and hyperkalemia (resolved) Baseline creatinine 1.5 (December 2019).  Creatinine creatinine seems to have stabilized at 3.54, patient is still having quite elevated BUN at 127, likely having confusion related to uremia.  Discussed with son nephrology recommendation the patient is not a dialysis candidate due to age and multiple comorbidities he disagrees and states at a tertiary care center they would be amenable to HD or PD.  Given prolonged hospital stay and minimal improvement in patient's clinical status will consult ethics, will continue goals of care discussions with son   Acute hypoxic respiratory failure secondary to CHF with reduced EF (TTE 33% ejection  fraction) Has some scant peripheral edema in bilateral lower extremities, pedal edema consistent with dependent edema.  CT of abdomen does show increased size of bilateral pleural effusions.  Previous chest x-ray shows vascular congestion.  Son still against any potential Lasix. We will continue goals of care discussion as mentioned above..  Based off weights patient has gone up during hospital stay.  Continue BiDil  Acute metabolic encephalopathy, stable Likely multifactorial etiology hospital-acquired delirium, uremia related to AKI on CKD, oxygen requirements.  EEG unremarkable.  Continue Seroquel, delirium precautions  Dysphasia/moderate malnutrition Recently evaluated by speech recommends continued n.p.o. given poor mental status and unsafe for oral intake.  Continue PEG tube feeding, would like to increase to appropriate rate to meet nutritional needs will discuss with son  Vaginal bleeding secondary to uterine fibroma Hemoglobin remained stable, monitor CBC   Hypernatremia, resolved Secondary to free water deficit, resolved with increasing free water via PEG tube  ESBL Klebsiella aspiration pneumonia Enterococcus UTI Completed antibiotic course  NSTEMI, resolved Cardiology recommended medical management only.  Continue atorvastatin Plavix, Lopressor  Left temporal frontal punctate CVA Evaluated by neurology.  No work-up TTE was unremarkable for vegetation or clot.  We will continue Plavix and atorvastatin  Type 2 diabetes A1c 5.9 (12/2017, down from 9 on 10/2017) fasting glucose on 1/8 a.m. 146.  Currently on detemir 12 units daily, continue to closely monitor CBGs.  Tardive dyskinesia Diagnosed this hospitalization (01/18/2018).  Family would like to continue tapering off will discontinue Cogentin today    DVT prophylaxis: Consultants:  Treatment Team:  Claudia Desanctis, MD  Ccs, Md, MD     Code Status: Full code  Family Communication: Discussed with son at bedside  patient's prolonged hospitalization and multiple comorbidities.  Son believes with some decrease  in her BUN that her mental status will eventually improve over a long period of time (1 year).  He states her mental status is improved for him in the afternoon which she is able to answer arithmetic questions correctly and have meaningful conversations with him.  I attempted to discuss her poor prognosis but he seems focused on's minimally improving her BUN and creatinine.  Disposition PlanGiven prolonged hospital stay and minimal improvement in patient's clinical status will consult ethics, will continue goals of care discussions with son, very poor prognosis        Objective: Vitals:   02/09/18 0403 02/09/18 0500 02/09/18 0813 02/09/18 1707  BP: 140/74  125/76 137/70  Pulse: 69  76 78  Resp: 12  18 20   Temp: 97.8 F (36.6 C)   97.6 F (36.4 C)  TempSrc:    Oral  SpO2:   100% 99%  Weight:  71.3 kg    Height:        Intake/Output Summary (Last 24 hours) at 02/09/2018 1756 Last data filed at 02/09/2018 6967 Gross per 24 hour  Intake -  Output 300 ml  Net -300 ml   Filed Weights   02/07/18 0437 02/08/18 0450 02/09/18 0500  Weight: 79 kg 78.3 kg 71.3 kg    Exam:  Constitutional: Elderly female, chronically ill-appearing, no distress Eyes: EOMI, anicteric ENMT: Oropharynx with moist mucous membranes Cardiovascular: RRR no MRGs, with no peripheral edema Respiratory: Normal respiratory effort, clear breath sounds but difficult to auscultate given positioning in bed Abdomen: Soft,non-tender, umbilical hernia present with hardening lymph node nontender with deep palpation, Skin: No rash ulcers, or lesions. Without skin tenting  Neurologic: Alert to voice, answering yes or no questions but upon further question grunts and remains silent, not following commands, randomly moving right arm above and below head   Data Reviewed: CBC: Recent Labs  Lab 02/06/18 0828 02/07/18 0616  02/08/18 1016 02/08/18 1131 02/09/18 0641  WBC 8.5 7.6 6.7 7.7 7.9  NEUTROABS  --  5.7  --   --   --   HGB 7.2* 7.2* 6.6* 7.5* 7.4*  HCT 23.8* 23.8* 22.0* 24.8* 24.3*  MCV 97.9 99.6 97.8 97.6 96.4  PLT 228 242 208 230 893   Basic Metabolic Panel: Recent Labs  Lab 02/06/18 0342 02/06/18 0828 02/07/18 0616 02/08/18 1016 02/09/18 0641  NA 135 135 135 134* 135  K 6.1* 5.4* 5.6* 5.0 5.3*  CL 99 96* 98 99 98  CO2 26 26 27 25 25   GLUCOSE 128* 122* 146* 163* 120*  BUN 131* 132* 133* 127* 129*  CREATININE 3.28* 3.47* 3.54* 3.54* 3.70*  CALCIUM 8.3* 8.8* 8.5* 7.8* 8.4*   GFR: Estimated Creatinine Clearance: 10 mL/min (A) (by C-G formula based on SCr of 3.7 mg/dL (H)). Liver Function Tests: No results for input(s): AST, ALT, ALKPHOS, BILITOT, PROT, ALBUMIN in the last 168 hours. No results for input(s): LIPASE, AMYLASE in the last 168 hours. No results for input(s): AMMONIA in the last 168 hours. Coagulation Profile: No results for input(s): INR, PROTIME in the last 168 hours. Cardiac Enzymes: No results for input(s): CKTOTAL, CKMB, CKMBINDEX, TROPONINI in the last 168 hours. BNP (last 3 results) No results for input(s): PROBNP in the last 8760 hours. HbA1C: No results for input(s): HGBA1C in the last 72 hours. CBG: Recent Labs  Lab 02/08/18 2313 02/09/18 0357 02/09/18 0814 02/09/18 1152 02/09/18 1707  GLUCAP 76 92 123* 161* 127*   Lipid Profile: No results for input(s): CHOL,  HDL, LDLCALC, TRIG, CHOLHDL, LDLDIRECT in the last 72 hours. Thyroid Function Tests: No results for input(s): TSH, T4TOTAL, FREET4, T3FREE, THYROIDAB in the last 72 hours. Anemia Panel: No results for input(s): VITAMINB12, FOLATE, FERRITIN, TIBC, IRON, RETICCTPCT in the last 72 hours. Urine analysis:    Component Value Date/Time   COLORURINE YELLOW 12/23/2017 1324   APPEARANCEUR CLOUDY (A) 12/23/2017 1324   LABSPEC 1.010 12/23/2017 1324   PHURINE 7.0 12/23/2017 1324   GLUCOSEU NEGATIVE  12/23/2017 1324   HGBUR MODERATE (A) 12/23/2017 1324   BILIRUBINUR NEGATIVE 12/23/2017 1324   KETONESUR NEGATIVE 12/23/2017 1324   PROTEINUR 100 (A) 12/23/2017 1324   UROBILINOGEN 0.2 06/30/2011 1214   NITRITE NEGATIVE 12/23/2017 1324   LEUKOCYTESUR LARGE (A) 12/23/2017 1324   Sepsis Labs: @LABRCNTIP (procalcitonin:4,lacticidven:4)  )No results found for this or any previous visit (from the past 240 hour(s)).    Studies: Ct Abdomen Pelvis Wo Contrast  Result Date: 02/09/2018 CLINICAL DATA:  Lymph node within umbilical hernia. EXAM: CT ABDOMEN AND PELVIS WITHOUT CONTRAST TECHNIQUE: Multidetector CT imaging of the abdomen and pelvis was performed following the standard protocol without IV contrast. Exam is somewhat limited due to persistent patient motion. COMPARISON:  CT scan of November 04, 2017. FINDINGS: Lower chest: Moderate bilateral pleural effusions are noted with adjacent subsegmental atelectasis. Hepatobiliary: No focal liver abnormality is seen. No gallstones, gallbladder wall thickening, or biliary dilatation. Pancreas: Unremarkable. No pancreatic ductal dilatation or surrounding inflammatory changes. Spleen: Normal in size without focal abnormality. Adrenals/Urinary Tract: Adrenal glands and kidneys are unremarkable. No hydronephrosis or renal obstruction is noted. No renal or ureteral calculi are noted. Vascular calcifications are noted in both kidneys. Urinary bladder is decompressed secondary to Foley catheter. Stomach/Bowel: Gastrostomy tube is well position within distal stomach. There is no evidence of bowel obstruction. Stool is noted throughout the colon. The appendix is not clearly visualized. Vascular/Lymphatic: Atherosclerosis of abdominal aorta is noted without aneurysm formation. The lymph node noted within the umbilical hernia on prior exam has significantly increased in size, now measuring 2.6 x 2.5 cm. This is concerning for malignancy. Reproductive: Enlarged fibroid uterus  is noted. No definite adnexal abnormality is noted. Other: Moderate anasarca is noted. Musculoskeletal: Multilevel degenerative disc disease is noted in the lumbar spine. No acute abnormality is noted. IMPRESSION: Lymph node noted within umbilical hernia on prior exam has significantly increased in size, now measuring 2.6 cm in maximum diameter. This is highly concerning for malignancy and tissue sampling is recommended. These results will be called to the ordering clinician or representative by the Radiologist Assistant, and communication documented in the PACS or zVision Dashboard. Moderate bilateral pleural effusions are noted with adjacent subsegmental atelectasis. Moderate anasarca. Enlarged fibroid uterus. Gastrostomy tube is noted. Aortic Atherosclerosis (ICD10-I70.0). Electronically Signed   By: Marijo Conception, M.D.   On: 02/09/2018 13:58    Scheduled Meds: . atorvastatin  40 mg Per Tube q1800  . chlorhexidine  15 mL Mouth Rinse BID  . cholecalciferol  400 Units Per Tube Daily  . clopidogrel  75 mg Per Tube Daily  . collagenase   Topical Daily  . docusate  100 mg Per Tube Daily  . famotidine  10 mg Oral Daily  . feeding supplement (JEVITY 1.2 CAL)  1,000 mL Per Tube Q24H  . ferrous sulfate  300 mg Per Tube Daily  . free water  250 mL Per Tube Q8H  . insulin aspart  0-15 Units Subcutaneous Q4H  . insulin detemir  12 Units  Subcutaneous Daily  . isosorbide-hydrALAZINE  0.5 tablet Per Tube TID  . mouth rinse  15 mL Mouth Rinse q12n4p  . metoprolol tartrate  25 mg Per Tube BID  . multivitamin  15 mL Per Tube Daily    Continuous Infusions: . sodium chloride Stopped (01/08/18 2158)     LOS: 62 days     Desiree Hane, MD [facility] Pager [phone]  If 7PM-7AM, please contact night-coverage [email or web address] Password [facility] 02/09/2018, 5:56 PM

## 2018-02-10 LAB — BASIC METABOLIC PANEL WITH GFR
Anion gap: 11 (ref 5–15)
BUN: 128 mg/dL — ABNORMAL HIGH (ref 8–23)
CO2: 26 mmol/L (ref 22–32)
Calcium: 8.6 mg/dL — ABNORMAL LOW (ref 8.9–10.3)
Chloride: 98 mmol/L (ref 98–111)
Creatinine, Ser: 3.65 mg/dL — ABNORMAL HIGH (ref 0.44–1.00)
GFR calc Af Amer: 12 mL/min — ABNORMAL LOW (ref >60)
GFR calc non Af Amer: 11 mL/min — ABNORMAL LOW (ref >60)
Glucose, Bld: 131 mg/dL — ABNORMAL HIGH (ref 70–99)
Potassium: 5.6 mmol/L — ABNORMAL HIGH (ref 3.5–5.1)
Sodium: 135 mmol/L (ref 135–145)

## 2018-02-10 LAB — GLUCOSE, CAPILLARY
Glucose-Capillary: 130 mg/dL — ABNORMAL HIGH (ref 70–99)
Glucose-Capillary: 131 mg/dL — ABNORMAL HIGH (ref 70–99)
Glucose-Capillary: 134 mg/dL — ABNORMAL HIGH (ref 70–99)
Glucose-Capillary: 145 mg/dL — ABNORMAL HIGH (ref 70–99)
Glucose-Capillary: 155 mg/dL — ABNORMAL HIGH (ref 70–99)
Glucose-Capillary: 79 mg/dL (ref 70–99)

## 2018-02-10 LAB — CBC
HCT: 25 % — ABNORMAL LOW (ref 36.0–46.0)
Hemoglobin: 7.4 g/dL — ABNORMAL LOW (ref 12.0–15.0)
MCH: 28.9 pg (ref 26.0–34.0)
MCHC: 29.6 g/dL — ABNORMAL LOW (ref 30.0–36.0)
MCV: 97.7 fL (ref 80.0–100.0)
PLATELETS: 243 10*3/uL (ref 150–400)
RBC: 2.56 MIL/uL — ABNORMAL LOW (ref 3.87–5.11)
RDW: 17.3 % — AB (ref 11.5–15.5)
WBC: 8.1 10*3/uL (ref 4.0–10.5)
nRBC: 0 % (ref 0.0–0.2)

## 2018-02-10 NOTE — Ethics Note (Signed)
Spoke today with Dr. Oretha Milch, attending hospitalist.   After reviewing the particulars of this patient's case, including medical history and current complicated and prolonged hospitalization, we discussed some concerns that Dr. Lonny Prude had regarding code status of the patient, and the patient's son who is her medical decision maker.   My Dr. Lisbeth Ply account, this is a very unfortunate patient, elderly with multiple comorbidities, poor prognosis for meaningful recovery, and no ability to assist in her own care of participate in the medical decision-making process. Medical team is reluctant to perform resuscitative measures given patient's poor prognosis. The issue of code status has been broached a few times with the son, including multiple visits from palliative care.    The son has been insistent on full code, and is also somewhat directing the nursing staff and asking for other interventions which are outside his area of expertise (patient's son is a physician, a Psychologist, sport and exercise, no longer practicing). He is present most days at bedside. Patient also has a daughter listed in her emergency contacts, it is not known whether she is available or willing to participate in her mom's care, or if there are other siblings. The son seems to be an attentive and well-meaning person. While there has been no serious conflict, staff has also expressed discomfort at his "doctoring" from time to time. Son's goal is to bring patient home. [name] condition continues to steadily deteriorate and code status is relevant to her care.  I advised Dr. Lonny Prude to consider the following framework for determining a good course of action for this patient, and guiding discussion with decision-maker(s):  Does patient have capacity to make her own decisions?   The consensus is that the patient cannot give informed consent, and there is no advanced directive. So, no, she does not have capacity. So...   Who is proxy  decision-maker?  There is no known advanced directive or designated proxy.   At this point, decisions have been made by son, as above.   However, with at least one other child in patient's contacts, I recommend follow [place] law (if no designated proxy decision maker, and no spouse, need consensus among reasonably available adult children) and attempt to reach out to this person to involve her in the decision making process and also see if other adult children are available to participate   Note: if other children are unavailable or unwilling to participate, son can continue to serve as sole Media planner.   We also need to ask...  Are decisions being made in the patient's best interest?   The son seems appropriately concerned for his mother's well-being, but...  Are decisions being made which align with the patient's values/wishes for herself? (making decisions with regard to patient autonomy)  There is evidence that the patient may not have wanted aggressive/invasive medical care or resuscitative measures   Patient at some point has indicated that she would not want PEG tube placed (earlier in the course of her illness, approx 12/2017 per Dr. Lonny Prude), but it is not clear to this writer if the patient had full capacity to understand risks/benefits of this procedure at the time, and eventually her son consented to placement of the feeding tube.   Note: if patient did NOT have capacity at the time to make this decision, the son's actions (consenting to PEG placement) may have been appropriate. If the patient DID have capacity and her wishes were overridden, this procedure was a violation of her autonomy. Patients have the  right to refuse medical treatment.   Can we involve other people who could give insight on what she would have wanted?   Even if they have no legal right to make decisions for her, it can be helpful to seek input from folks such as friends, family, patient's primary care  physician, clergy, etc.   Would these people be able to give the son any insight into what his mother would have wanted for herself, or make a strong case that the son is not making decisions for the patient that she, herself, would make?   Other considerations:  Can we aim to have the patient home with hospice services? (Son has indicated he wants to bring her home)  Does a discussion need to take place regarding the patient's son as a physician, and how this may be affecting his decisions for his mom and his interactions with staff?   Does the [facility] Futility Policy need to be invoked here if the patient truly has a terminal illness and resuscitative measures will not be helpful? (See [facility] policies and procedures - medical team is not required to provide futile care, but I would advise review of the policy on file)   Ethics committee is always available to follow-up, and/or facilitate a meeting between the medical staff and the patient/family if needed. Dr. Lonny Prude has my personal phone number and the Ethics pager can be called at [phone]. Please do not hesitate to reach out if we can be of further assistance in this case!   Dr. Emeterio Reeve, [place]

## 2018-02-10 NOTE — Progress Notes (Addendum)
PROGRESS NOTE  [name] ZOX:[id number] DOB: [birth year] DOA: 12/05/2017 PCP: Deland Pretty, MD  HPI/Brief Narrative  [name] is a 85 y.o. year old female with medical history significant for CAD s/p CABG, recent ischemic stroke, HTN, DM, HLD, stage 3 CKD recent admission from 10/5-10/25 for failure to thrive with profound weight loss due to swallowing problems s/p botox injection for achalasia, who presented on  12/05/2017 for admission from her SNF due to difficulty speaking which progressed tp aphasia.  Patient has had prolonged hospital course significant for peg tube placement by IR (12/11/2018) due to dysphagia from achalasia, NSTEMI, respiratory failure requiring intubation, EnterococcusUTI/Klebsiella pneumonia, anemia and now worsening renal failure. Palliative has been involved intermittently throughout hospital stay, son wants FULL code as he feels strongly she will not receive adequate care if she is made DRN/DNI  Subjective No acute events overnight Son not at bedside  Assessment/Plan:  #Lymph node within umbilical hernia, increased in size On Ct abd from 1/10 increased in size from 11/20 to 2.6 cm concerning for malignancy. Surgery doesn't think this is an incarcerated hernia. Discussed with Son who does not want core biopsy as he doesn't want her to go through more imaging and wouldn't think she could tolerate any potential treatments   AKI on CKD stage III (multifactorial etiology), not improving Suspect related to fluid shifts from recent infections, CHF exacerbations, aspiration pneumonia CVA, quite elevated BUN and hyperkalemia (resolved) Baseline creatinine 1.5 (December 2019).  BUN/Creatinine creatinine seems to have stabilized at 3.54, patient is still having quite elevated BUN in 120s.  Awaiting today's BMP. Son does not want to try lasix which I think could be somewhat helpful if this is cardiorenal syndrome given her bilateral pleural effusions, oxygen requirements  and volume status. I am holding off on further IVF as it has not helped and only increases her risk for severe pulmonary edema and respiratory distress. Nephrology consultants have no further recommendations as she is not a dialysis candidate with her age, comorbidities. This too was discussed with son who states at a tertiary care center they would be amenable to HD or PD.  Given prolonged hospital stay and minimal improvement in patient's clinical status and difficulty initiating goals of care discussions with son I will consult ethics,  Acute hypoxic respiratory failure secondary to CHF with reduced EF (TTE 33% ejection fraction) Stable o2 requirements, still with anasarca likely related to hypoalbuminemia in addition to CHF flare, and now worsening b/l pleural effusion on imaging.. Son against further lasix therapy as he fears this will worsen her kidney function; im not sure how helpful it would be now given her clinical status. We will continue goals of care discussion as mentioned above.  Continue BiDil, daily weights, strict I/os  Acute metabolic encephalopathy, stable Still fluctuating ability to converse ( says hello) otherwise non-sensical or grunts. Likely multifactorial etiology hospital-acquired delirium, uremia related to AKI on CKD, oxygen requirements, suspected vascular dementia related to chronic infarcts in addition to CVA here.  EEG unremarkable.  Continue Seroquel, delirium precautions  Dysphagia related to achalasia with failed botox/moderate malnutrition/failure to thrive Peg placed in 12/20/17 by IR. Previously required TPN during hospitalization. Given altered mental status and unsafe oral intake speech recommends continued n.p.o.  Continue PEG tube feeding, would like to increase to appropriate rate to meet nutritional needs but son is against for fear that more protein will worsen her BUN.  Vaginal bleeding secondary to uterine fibroma Hemoglobin remained stable, monitor CBC  Hypernatremia, resolved Secondary to free water deficit, resolved with increasing free water via PEG tube  ESBL Klebsiella aspiration pneumonia Enterococcus UTI Completed antibiotic course  NSTEMI, resolved Cardiology recommended medical management only.  Continue atorvastatin Plavix, Lopressor  Left temporal frontal punctate CVA Evaluated by neurology.  No work-up TTE was unremarkable for vegetation or clot.  We will continue Plavix and atorvastatin  Type 2 diabetes A1c 5.9 (12/2017, down from 9 on 10/2017) fasting glucose consistently in 140s-150s  Currently on detemir 12 units daily while on tube feeds, continue to closely monitor CBGs.  Tardive dyskinesia Diagnosed this hospitalization (01/18/2018).  Has been discontinued   DVT prophylaxis: Consultants:  Treatment Team:  Claudia Desanctis, MD  Ccs, Md, MD     Code Status: Full code  Family Communication: on 1/11 Discussed with son at bedside patient's prolonged hospitalization and multiple comorbidities.  Son believes with some decrease in her BUN that her mental status will eventually improve over a long period of time (1 year).  He states her mental status is improved for him in the afternoon which she is able to answer arithmetic questions correctly and have meaningful conversations with him.  I attempted to discuss her poor prognosis but he seems focused on's minimally improving her BUN and creatinine.  Disposition PlanGiven prolonged hospital stay and minimal improvement in patient's clinical status will consult ethics, will continue goals of care discussions with son, very poor prognosis        Objective: Vitals:   02/09/18 1707 02/09/18 2125 02/09/18 2307 02/10/18 0745  BP: 137/70 (!) 151/69 135/69 131/68  Pulse: 78 79 72 83  Resp: 20  16 20   Temp: 97.6 F (36.4 C)  98.2 F (36.8 C) (!) 97.5 F (36.4 C)  TempSrc: Oral  Axillary Oral  SpO2: 99%  100% 96%  Weight:      Height:        Intake/Output  Summary (Last 24 hours) at 02/10/2018 1529 Last data filed at 02/09/2018 2030 Gross per 24 hour  Intake -  Output 401 ml  Net -401 ml   Filed Weights   02/07/18 0437 02/08/18 0450 02/09/18 0500  Weight: 79 kg 78.3 kg 71.3 kg    Exam:  Constitutional: Elderly female, chronically ill-appearing, no distress Eyes: EOMI, anicteric Cardiovascular: RRR no MRGs, 1+ pitting edema of bilateral lower extremities ( ankle to thigh) Respiratory: Normal respiratory effort on 2 L, clear breath sounds but difficult to auscultate given positioning in bed Abdomen: Soft,non-tender, umbilical hernia present with non-mobile ,large hardened lymph node nontender with deep palpation, Skin: No rash ulcers, or lesions. Without skin tenting  Neurologic: Grunting, not following commands, randomly moving right arm above and below head,    Data Reviewed: CBC: Recent Labs  Lab 02/06/18 0828 02/07/18 0616 02/08/18 1016 02/08/18 1131 02/09/18 0641  WBC 8.5 7.6 6.7 7.7 7.9  NEUTROABS  --  5.7  --   --   --   HGB 7.2* 7.2* 6.6* 7.5* 7.4*  HCT 23.8* 23.8* 22.0* 24.8* 24.3*  MCV 97.9 99.6 97.8 97.6 96.4  PLT 228 242 208 230 063   Basic Metabolic Panel: Recent Labs  Lab 02/06/18 0342 02/06/18 0828 02/07/18 0616 02/08/18 1016 02/09/18 0641  NA 135 135 135 134* 135  K 6.1* 5.4* 5.6* 5.0 5.3*  CL 99 96* 98 99 98  CO2 26 26 27 25 25   GLUCOSE 128* 122* 146* 163* 120*  BUN 131* 132* 133* 127* 129*  CREATININE 3.28* 3.47* 3.54*  3.54* 3.70*  CALCIUM 8.3* 8.8* 8.5* 7.8* 8.4*   GFR: Estimated Creatinine Clearance: 10 mL/min (A) (by C-G formula based on SCr of 3.7 mg/dL (H)). Liver Function Tests: No results for input(s): AST, ALT, ALKPHOS, BILITOT, PROT, ALBUMIN in the last 168 hours. No results for input(s): LIPASE, AMYLASE in the last 168 hours. No results for input(s): AMMONIA in the last 168 hours. Coagulation Profile: No results for input(s): INR, PROTIME in the last 168 hours. Cardiac Enzymes: No  results for input(s): CKTOTAL, CKMB, CKMBINDEX, TROPONINI in the last 168 hours. BNP (last 3 results) No results for input(s): PROBNP in the last 8760 hours. HbA1C: No results for input(s): HGBA1C in the last 72 hours. CBG: Recent Labs  Lab 02/09/18 1945 02/10/18 0010 02/10/18 0411 02/10/18 0745 02/10/18 1204  GLUCAP 95 79 134* 145* 155*   Lipid Profile: No results for input(s): CHOL, HDL, LDLCALC, TRIG, CHOLHDL, LDLDIRECT in the last 72 hours. Thyroid Function Tests: No results for input(s): TSH, T4TOTAL, FREET4, T3FREE, THYROIDAB in the last 72 hours. Anemia Panel: No results for input(s): VITAMINB12, FOLATE, FERRITIN, TIBC, IRON, RETICCTPCT in the last 72 hours. Urine analysis:    Component Value Date/Time   COLORURINE YELLOW 12/23/2017 1324   APPEARANCEUR CLOUDY (A) 12/23/2017 1324   LABSPEC 1.010 12/23/2017 1324   PHURINE 7.0 12/23/2017 1324   GLUCOSEU NEGATIVE 12/23/2017 1324   HGBUR MODERATE (A) 12/23/2017 1324   BILIRUBINUR NEGATIVE 12/23/2017 1324   KETONESUR NEGATIVE 12/23/2017 1324   PROTEINUR 100 (A) 12/23/2017 1324   UROBILINOGEN 0.2 06/30/2011 1214   NITRITE NEGATIVE 12/23/2017 1324   LEUKOCYTESUR LARGE (A) 12/23/2017 1324   Sepsis Labs: @LABRCNTIP (procalcitonin:4,lacticidven:4)  )No results found for this or any previous visit (from the past 240 hour(s)).    Studies: No results found.  Scheduled Meds: . atorvastatin  40 mg Per Tube q1800  . chlorhexidine  15 mL Mouth Rinse BID  . cholecalciferol  400 Units Per Tube Daily  . clopidogrel  75 mg Per Tube Daily  . collagenase   Topical Daily  . docusate  100 mg Per Tube Daily  . famotidine  10 mg Oral Daily  . feeding supplement (JEVITY 1.2 CAL)  1,000 mL Per Tube Q24H  . ferrous sulfate  300 mg Per Tube Daily  . free water  250 mL Per Tube Q8H  . insulin aspart  0-15 Units Subcutaneous Q4H  . insulin detemir  12 Units Subcutaneous Daily  . isosorbide-hydrALAZINE  0.5 tablet Per Tube TID  . mouth  rinse  15 mL Mouth Rinse q12n4p  . metoprolol tartrate  25 mg Per Tube BID  . multivitamin  15 mL Per Tube Daily    Continuous Infusions: . sodium chloride Stopped (01/08/18 2158)     LOS: 63 days     Desiree Hane, MD [facility] Pager [phone]  If 7PM-7AM, please contact night-coverage [email or web address] Password [facility] 02/10/2018, 3:29 PM

## 2018-02-11 DIAGNOSIS — N185 Chronic kidney disease, stage 5: Secondary | ICD-10-CM | POA: Diagnosis not present

## 2018-02-11 DIAGNOSIS — R404 Transient alteration of awareness: Secondary | ICD-10-CM

## 2018-02-11 LAB — BASIC METABOLIC PANEL
Anion gap: 11 (ref 5–15)
BUN: 132 mg/dL — ABNORMAL HIGH (ref 8–23)
CO2: 26 mmol/L (ref 22–32)
Calcium: 8.4 mg/dL — ABNORMAL LOW (ref 8.9–10.3)
Chloride: 96 mmol/L — ABNORMAL LOW (ref 98–111)
Creatinine, Ser: 3.58 mg/dL — ABNORMAL HIGH (ref 0.44–1.00)
GFR calc Af Amer: 12 mL/min — ABNORMAL LOW (ref >60)
GFR calc non Af Amer: 11 mL/min — ABNORMAL LOW (ref >60)
GLUCOSE: 69 mg/dL — AB (ref 70–99)
Potassium: 5.5 mmol/L — ABNORMAL HIGH (ref 3.5–5.1)
Sodium: 133 mmol/L — ABNORMAL LOW (ref 135–145)

## 2018-02-11 LAB — GLUCOSE, CAPILLARY
Glucose-Capillary: 116 mg/dL — ABNORMAL HIGH (ref 70–99)
Glucose-Capillary: 129 mg/dL — ABNORMAL HIGH (ref 70–99)
Glucose-Capillary: 130 mg/dL — ABNORMAL HIGH (ref 70–99)
Glucose-Capillary: 130 mg/dL — ABNORMAL HIGH (ref 70–99)
Glucose-Capillary: 64 mg/dL — ABNORMAL LOW (ref 70–99)
Glucose-Capillary: 85 mg/dL (ref 70–99)
Glucose-Capillary: 85 mg/dL (ref 70–99)

## 2018-02-11 LAB — CBC
HCT: 24.2 % — ABNORMAL LOW (ref 36.0–46.0)
Hemoglobin: 7.7 g/dL — ABNORMAL LOW (ref 12.0–15.0)
MCH: 30.9 pg (ref 26.0–34.0)
MCHC: 31.8 g/dL (ref 30.0–36.0)
MCV: 97.2 fL (ref 80.0–100.0)
Platelets: 207 10*3/uL (ref 150–400)
RBC: 2.49 MIL/uL — ABNORMAL LOW (ref 3.87–5.11)
RDW: 17.2 % — ABNORMAL HIGH (ref 11.5–15.5)
WBC: 6.6 10*3/uL (ref 4.0–10.5)
nRBC: 0 % (ref 0.0–0.2)

## 2018-02-11 MED ORDER — DEXTROSE 50 % IV SOLN
INTRAVENOUS | Status: AC
  Administered 2018-02-11: 50 mL
  Filled 2018-02-11: qty 50

## 2018-02-11 NOTE — Progress Notes (Signed)
PROGRESS NOTE  [name] ZJI:[id number] DOB: [birth year] DOA: 12/05/2017 PCP: Deland Pretty, MD  HPI/Brief Narrative  [name] is a 85 y.o. year old female with medical history significant for CAD s/p CABG, recent ischemic stroke, HTN, DM, HLD, stage 3 CKD recent admission from 10/5-10/25 for failure to thrive with profound weight loss due to swallowing problems s/p botox injection for achalasia, who presented on  12/05/2017 for admission from her SNF due to difficulty speaking which progressed tp aphasia.  Patient has had prolonged hospital course significant for peg tube placement by IR (12/11/2018) due to dysphagia from achalasia, NSTEMI, respiratory failure requiring intubation, EnterococcusUTI/Klebsiella pneumonia, anemia and now worsening renal failure. Palliative has been involved intermittently throughout hospital stay, son wants FULL code as he feels strongly she will not receive adequate care if she is made DRN/DNI  Subjective No acute events overnight Son not at bedside  Assessment/Plan:  #Lymph node within umbilical hernia, increased in size On Ct abd from 1/10 increased in size from 11/20 to 2.6 cm concerning for malignancy. Surgery doesn't think this is an incarcerated hernia. Discussed with Son who does not want core biopsy as he doesn't want her to go through more imaging and wouldn't think she could tolerate any potential treatments   AKI on CKD stage III (multifactorial etiology), not improving Suspect related to fluid shifts from recent infections, CHF exacerbations, aspiration pneumonia CVA,  Still quite elevated BUN and intermittent hyperkalemia (resolved) Baseline creatinine 1.5 (December 2019).  BUN/Creatinine creatinine seems to have stabilized at 3.54, patient is still having quite elevated BUN in 120-130s.  She is not a candidate for dialysis per nephrology, discussion with cardiology/advanced heart failure no other medical management is advised related to what is  most likely cardiorenal syndrome.  Still having minimal urine output but close to becoming an uric based on current rate.  Will continue goals of care discussions with son.  Goals of care Patient has very poor prognosis.  Multiple goals of care discussions have been initiated by previous providers.  Ethics was consulted on 1/11 and provided advice to ensure medical treatment is consistent with what the patient has previously desired.  In previous discussions with son he has wanted low rate IV fluids for her worsening kidney function despite evidence of her significant volume overload status.  He is also mention tertiary care centers being able to do dialysis despite her age and comorbidities when discussing nephrology recommendation that she is not a dialysis candidate.  Acute hypoxic respiratory failure secondary to CHF with reduced EF (TTE 33% ejection fraction), stable Stable o2 requirements, still with anasarca likely related to hypoalbuminemia in addition to CHF flare, and now worsening b/l pleural effusion on imaging.I do not think she would benefit from further IV Lasix given her significant kidney dysfunction, cardiology also agrees no further medical management would be beneficial and emphatically encourages goals of care discussions.  Continue BiDil, daily weights, strict I/os  Acute metabolic encephalopathy, stable Still fluctuating ability to converse ( says hello) otherwise non-sensical or grunts. Likely multifactorial etiology hospital-acquired delirium, uremia related to AKI on CKD, oxygen requirements, suspected vascular dementia related to chronic infarcts in addition to CVA here.  EEG unremarkable.  Continue Seroquel, delirium precautions  Dysphagia related to achalasia with failed botox/moderate malnutrition/failure to thrive Peg placed in 12/20/17 by IR. Previously required TPN during hospitalization. Given altered mental status and unsafe oral intake speech recommends continued  n.p.o.  Continue PEG tube feeding, would like to increase to appropriate  rate to meet nutritional needs but son is against for fear that more protein will worsen her BUN.  Vaginal bleeding secondary to uterine fibroma Hemoglobin remained stable, monitor CBC   Hypernatremia, resolved Secondary to free water deficit, resolved with increasing free water via PEG tube  ESBL Klebsiella aspiration pneumonia Enterococcus UTI Completed antibiotic course  NSTEMI, resolved Cardiology recommended medical management only.  Continue atorvastatin Plavix, Lopressor  Left temporal frontal punctate CVA Evaluated by neurology.  No work-up TTE was unremarkable for vegetation or clot.  We will continue Plavix and atorvastatin  Type 2 diabetes A1c 5.9 (12/2017, down from 9 on 10/2017) fasting glucose consistently in 140s-150s  Currently on detemir 12 units daily while on tube feeds, continue to closely monitor CBGs.  Tardive dyskinesia Diagnosed this hospitalization (01/18/2018).  Has been discontinued   DVT prophylaxis: Consultants:  Treatment Team:  Claudia Desanctis, MD  Ccs, Md, MD     Code Status: Full code  Family Communication: on 1/11 Discussed with son at bedside patient's prolonged hospitalization and multiple comorbidities.  Son believes with some decrease in her BUN that her mental status will eventually improve over a long period of time (1 year).  He states her mental status is improved for him in the afternoon which she is able to answer arithmetic questions correctly and have meaningful conversations with him.  I attempted to discuss her poor prognosis but he seems focused on's minimally improving her BUN and creatinine.  Will call son and update interactive further goals of care discussions.  Disposition PlanGiven prolonged hospital stay and minimal improvement in patient's clinical status will consult ethics, will continue goals of care discussions with son, very poor  prognosis        Objective: Vitals:   02/10/18 1725 02/10/18 2211 02/10/18 2213 02/11/18 0842  BP: (!) 143/77 (!) 159/81 (!) 159/81 100/72  Pulse: 80 88 88 85  Resp: 18  (!) 22 12  Temp: 98.1 F (36.7 C)  98.3 F (36.8 C) 97.9 F (36.6 C)  TempSrc: Oral  Axillary Oral  SpO2: 100%  100% 100%  Weight:      Height:        Intake/Output Summary (Last 24 hours) at 02/11/2018 1231 Last data filed at 02/10/2018 1800 Gross per 24 hour  Intake 2440 ml  Output 200 ml  Net 2240 ml   Filed Weights   02/07/18 0437 02/08/18 0450 02/09/18 0500  Weight: 79 kg 78.3 kg 71.3 kg    Exam:  Constitutional: Elderly female, chronically ill-appearing, no distress Eyes: EOMI, anicteric Cardiovascular: RRR no MRGs, 2+ pitting edema of bilateral lower extremities ( ankle to thigh) Respiratory: Normal respiratory effort on 2 L,  difficult to auscultate given positioning in bed Abdomen: Soft,non-tender, umbilical hernia present with non-mobile ,large hardened lymph node nontender with deep palpation, Skin: No rash ulcers, or lesions. Without skin tenting  Neurologic: Grunting, not following commands, randomly moving right arm above and below head, does not move left arm on own  Data Reviewed: CBC: Recent Labs  Lab 02/07/18 0616 02/08/18 1016 02/08/18 1131 02/09/18 0641 02/10/18 1558 02/11/18 0407  WBC 7.6 6.7 7.7 7.9 8.1 6.6  NEUTROABS 5.7  --   --   --   --   --   HGB 7.2* 6.6* 7.5* 7.4* 7.4* 7.7*  HCT 23.8* 22.0* 24.8* 24.3* 25.0* 24.2*  MCV 99.6 97.8 97.6 96.4 97.7 97.2  PLT 242 208 230 226 243 630   Basic Metabolic Panel: Recent Labs  Lab 02/07/18 0616 02/08/18 1016 02/09/18 0641 02/10/18 1558 02/11/18 0407  NA 135 134* 135 135 133*  K 5.6* 5.0 5.3* 5.6* 5.5*  CL 98 99 98 98 96*  CO2 27 25 25 26 26   GLUCOSE 146* 163* 120* 131* 69*  BUN 133* 127* 129* 128* 132*  CREATININE 3.54* 3.54* 3.70* 3.65* 3.58*  CALCIUM 8.5* 7.8* 8.4* 8.6* 8.4*   GFR: Estimated Creatinine  Clearance: 10.3 mL/min (A) (by C-G formula based on SCr of 3.58 mg/dL (H)). Liver Function Tests: No results for input(s): AST, ALT, ALKPHOS, BILITOT, PROT, ALBUMIN in the last 168 hours. No results for input(s): LIPASE, AMYLASE in the last 168 hours. No results for input(s): AMMONIA in the last 168 hours. Coagulation Profile: No results for input(s): INR, PROTIME in the last 168 hours. Cardiac Enzymes: No results for input(s): CKTOTAL, CKMB, CKMBINDEX, TROPONINI in the last 168 hours. BNP (last 3 results) No results for input(s): PROBNP in the last 8760 hours. HbA1C: No results for input(s): HGBA1C in the last 72 hours. CBG: Recent Labs  Lab 02/11/18 0018 02/11/18 0359 02/11/18 0445 02/11/18 0844 02/11/18 1220  GLUCAP 85 64* 129* 130* 130*   Lipid Profile: No results for input(s): CHOL, HDL, LDLCALC, TRIG, CHOLHDL, LDLDIRECT in the last 72 hours. Thyroid Function Tests: No results for input(s): TSH, T4TOTAL, FREET4, T3FREE, THYROIDAB in the last 72 hours. Anemia Panel: No results for input(s): VITAMINB12, FOLATE, FERRITIN, TIBC, IRON, RETICCTPCT in the last 72 hours. Urine analysis:    Component Value Date/Time   COLORURINE YELLOW 12/23/2017 1324   APPEARANCEUR CLOUDY (A) 12/23/2017 1324   LABSPEC 1.010 12/23/2017 1324   PHURINE 7.0 12/23/2017 1324   GLUCOSEU NEGATIVE 12/23/2017 1324   HGBUR MODERATE (A) 12/23/2017 1324   BILIRUBINUR NEGATIVE 12/23/2017 1324   KETONESUR NEGATIVE 12/23/2017 1324   PROTEINUR 100 (A) 12/23/2017 1324   UROBILINOGEN 0.2 06/30/2011 1214   NITRITE NEGATIVE 12/23/2017 1324   LEUKOCYTESUR LARGE (A) 12/23/2017 1324   Sepsis Labs: @LABRCNTIP (procalcitonin:4,lacticidven:4)  )No results found for this or any previous visit (from the past 240 hour(s)).    Studies: No results found.  Scheduled Meds: . atorvastatin  40 mg Per Tube q1800  . chlorhexidine  15 mL Mouth Rinse BID  . cholecalciferol  400 Units Per Tube Daily  . clopidogrel  75  mg Per Tube Daily  . collagenase   Topical Daily  . docusate  100 mg Per Tube Daily  . famotidine  10 mg Oral Daily  . feeding supplement (JEVITY 1.2 CAL)  1,000 mL Per Tube Q24H  . ferrous sulfate  300 mg Per Tube Daily  . free water  250 mL Per Tube Q8H  . insulin aspart  0-15 Units Subcutaneous Q4H  . insulin detemir  12 Units Subcutaneous Daily  . isosorbide-hydrALAZINE  0.5 tablet Per Tube TID  . mouth rinse  15 mL Mouth Rinse q12n4p  . metoprolol tartrate  25 mg Per Tube BID  . multivitamin  15 mL Per Tube Daily    Continuous Infusions: . sodium chloride Stopped (01/08/18 2158)     LOS: 64 days     Desiree Hane, MD [facility] Pager [phone]  If 7PM-7AM, please contact night-coverage [email or web address] Password [facility] 02/11/2018, 12:31 PM

## 2018-02-12 DIAGNOSIS — R0902 Hypoxemia: Secondary | ICD-10-CM

## 2018-02-12 DIAGNOSIS — R0602 Shortness of breath: Secondary | ICD-10-CM

## 2018-02-12 DIAGNOSIS — N185 Chronic kidney disease, stage 5: Secondary | ICD-10-CM

## 2018-02-12 LAB — GLUCOSE, CAPILLARY
Glucose-Capillary: 101 mg/dL — ABNORMAL HIGH (ref 70–99)
Glucose-Capillary: 116 mg/dL — ABNORMAL HIGH (ref 70–99)
Glucose-Capillary: 131 mg/dL — ABNORMAL HIGH (ref 70–99)
Glucose-Capillary: 75 mg/dL (ref 70–99)
Glucose-Capillary: 87 mg/dL (ref 70–99)
Glucose-Capillary: 90 mg/dL (ref 70–99)
Glucose-Capillary: 92 mg/dL (ref 70–99)

## 2018-02-12 LAB — BASIC METABOLIC PANEL
Anion gap: 11 (ref 5–15)
BUN: 132 mg/dL — ABNORMAL HIGH (ref 8–23)
CO2: 25 mmol/L (ref 22–32)
Calcium: 8.2 mg/dL — ABNORMAL LOW (ref 8.9–10.3)
Chloride: 97 mmol/L — ABNORMAL LOW (ref 98–111)
Creatinine, Ser: 3.74 mg/dL — ABNORMAL HIGH (ref 0.44–1.00)
GFR calc Af Amer: 12 mL/min — ABNORMAL LOW (ref >60)
GFR calc non Af Amer: 10 mL/min — ABNORMAL LOW (ref >60)
Glucose, Bld: 122 mg/dL — ABNORMAL HIGH (ref 70–99)
Potassium: 5.5 mmol/L — ABNORMAL HIGH (ref 3.5–5.1)
Sodium: 133 mmol/L — ABNORMAL LOW (ref 135–145)

## 2018-02-12 LAB — CBC
HCT: 23.2 % — ABNORMAL LOW (ref 36.0–46.0)
Hemoglobin: 7.1 g/dL — ABNORMAL LOW (ref 12.0–15.0)
MCH: 30.3 pg (ref 26.0–34.0)
MCHC: 30.6 g/dL (ref 30.0–36.0)
MCV: 99.1 fL (ref 80.0–100.0)
Platelets: 212 10*3/uL (ref 150–400)
RBC: 2.34 MIL/uL — ABNORMAL LOW (ref 3.87–5.11)
RDW: 17 % — ABNORMAL HIGH (ref 11.5–15.5)
WBC: 7.2 10*3/uL (ref 4.0–10.5)
nRBC: 0 % (ref 0.0–0.2)

## 2018-02-12 NOTE — Progress Notes (Addendum)
PROGRESS NOTE  Jaliah Foody NLZ:[id number] DOB: 11/01/1927 DOA: 12/05/2017 PCP: Deland Pretty, MD  HPI/Brief Narrative  [name] is a 85 y.o. year old female with medical history significant for CAD s/p CABG, recent ischemic stroke, HTN, DM, HLD, stage 3 CKD recent admission from 10/5-10/25 for failure to thrive with profound weight loss due to swallowing problems s/p botox injection for achalasia, who presented on  12/05/2017 for admission from her SNF due to difficulty speaking which progressed tp aphasia.  Patient has had prolonged hospital course significant for peg tube placement by IR (12/11/2018) due to dysphagia from achalasia, NSTEMI, respiratory failure requiring intubation, EnterococcusUTI/Klebsiella pneumonia, anemia and now worsening renal failure. Palliative has been involved intermittently throughout hospital stay, son wants FULL code as he feels strongly she will not receive adequate care if she is made DRN/DNI.  Hospital events 11/10 PEG placed poor oral intake related 11/19 General surgery consulted, found to have nonincarcerated umbilical hernia no surgical needs and lymph node within hernia. 11/21 developed respiratory failure due to to significant volume overload from CHF exacerbation, treated aggressively with IV diuretics, NSTEMI medically managed, EF found to be 30% at that time 11/27 ENT consulted for stridor, flexible endoscopy negative 12/2 intubated for persistent pulmonary edema/hypervolemic status from heart failure flare (critical care was consulted for agonal respirations prompting transfer to ICU), with extensive IV diuresis, extubated 12/6 12/17 persistent AKI/worsening of GFR 12/18 transfer to progressive unit  1/7, nephrology deems not a dialysis candidate given age and multiple comorbidities/Poor functional status, progressive worsening kidney dysfunction, not improving with any medical management for CHF flare 1/12 advanced heart failure does not recommend  any further medical management for CHF with reduced EF, and that encourages goals of care discussion 1/13 patient son like to transfer to tertiary care center for initiation of dialysis  Subjective No acute events overnight Son not at bedside  Assessment/Plan:  #Lymph node within umbilical hernia, increased in size On Ct abd from 1/10 increased in size from 11/20 to 2.6 cm concerning for malignancy. Surgery doesn't think this is an incarcerated hernia. Discussed with Son who does not want core biopsy as he doesn't want her to go through more imaging and is less concerned with this given no other lymphadenopathy on imaging.  Like to continue to monitor.  AKI on CKD stage III (multifactorial etiology), seems to be progressing to end-stage renal disease Suspect related to fluid shifts/initial pre-renal etiology  from recent infections and poor oral intake with hypotension (initially treated with IVF), CHF exacerbations, aspiration pneumonia CVA,.  Given persistent vascular congestion, anasarca/hypervolemia suspect the predominant force is cardiorenal syndrome. GFR has been persistently 10-12 for the past week.  Still making minimal urine but close to anuric, persistently hyperkalemic, profoundly volume up but maintaining normal saturation on only 2 L.  Had very prolonged discussion with her son Dr. Mayer Masker on 1/13.  Explained our nephrologist recommendation that she is not a dialysis candidate given her multiple comorbidities and age.  But given the relative subacute progression of her renal dysfunction mental status he would like to try transfer to tertiary care center, specifically for [place].  I will touch base with nephrologist from [facility] on 1/14 see if they would consider transfer and possible initiation of HD.  [name] states this is in line with her goals as she has 2 brothers each in their late 65s currently on dialysis and doing well.  We will continue to monitor BMP, avoid nephrotoxins,  monitor urine output.  Goals of care Patient has very poor prognosis.  Multiple goals of care discussions have been initiated by previous providers.  Ethics was consulted on 1/11 and provided advice to ensure medical treatment is consistent with what the patient has previously desired.  Per most recent discussion with son on 1/13 pursuing HD will be on hold her goals given she has siblings in a similar position on HD in the late 90s doing well.  I will discuss with nephrology at [facility] on 1/14 for potential transfer.    Acute hypoxic respiratory failure secondary to CHF with reduced EF (TTE 33% ejection fraction), stable  likely related to hypoalbuminemia in addition to CHF flare, and now worsening b/l pleural effusion on imagingStable o2 requirements on only 2 L.  Discussed with advanced heart failure director on 1/12; clinical picture concerning for cardiorenal syndrome as patient has been profoundly hypovolemic with evidence of vascular congestion and bilateral pleural effusions in the setting of reduced EF.  She has not responded well to medical management.  Due to her worsening kidney function we are unable to use Lasix.Cardiac standpoint do not recommend any further medical management. As mentioned above son would like to pursue potential transfer to tertiary care center.  We will continue to hold off on any diuretic therapy given her poor kidney function, continue BiDil for afterload reduction, daily weights, strict I/os   Acute metabolic encephalopathy, stable Still fluctuating ability to converse ( says hello) otherwise non-sensical or grunts. Likely multifactorial etiology hospital-acquired delirium, uremia related to AKI on CKD, oxygen requirements, suspected vascular dementia related to chronic infarcts in addition to CVA here.  Prior to this admission in September 2019 patient was alert and oriented x4 during most of her ADLs and very well-functioning.  EEG unremarkable.  No longer on  Seroquel, delirium precautions  Acute on chronic anemia chronic disease, most likely CKD Baseline hemoglobin prior to hospital stay 10-11.  Has persistently remain 7 to 8 months of hospitalization.  Closely monitor CBCs.  Goal hemoglobin greater than 8, however concern patient will not tolerate volume for blood transfusion given reduced EF and significant pulmonary edema/pleural effusion with limited ability to diurese given kidney function.  Dysphagia related to achalasia with failed botox/moderate malnutrition/failure to thrive Continue PEG tube feeding, would like to increase to appropriate rate to meet nutritional needs but son is against for fear that more protein will worsen her BUN.  Vaginal bleeding secondary to uterine fibroma Hemoglobin remained stable, monitor CBC   Hypernatremia, resolved Secondary to free water deficit, resolved with increasing free water via PEG tube  ESBL Klebsiella aspiration pneumonia Enterococcus UTI Completed antibiotic course  NSTEMI, resolved Cardiology recommended medical management only.  Continue atorvastatin Plavix, Lopressor  Left temporal frontal punctate CVA Evaluated by neurology.  No work-up TTE was unremarkable for vegetation or clot.  We will continue Plavix and atorvastatin  Type 2 diabetes A1c 5.9 (12/2017, down from 9 on 10/2017) fasting glucose consistently in 140s-150s  Currently on detemir 12 units daily while on tube feeds, continue to closely monitor CBGs.  Tardive dyskinesia Diagnosed this hospitalization (01/18/2018) presumed related to reglan.  Cogentin Has been discontinued( on 02/09/18)   DVT prophylaxis: Consultants: Neurology, cardiology, surgery, critical care/home, IR, gastroenterology    Code Status: Full code  Family Communication: on 1/13 discussed with son by phone.  Again went over her entire hospital course and progressive deterioration in kidney function and mental status.  Explained our consultants  recommendations for more palliative options.  He believes it would be in line with her wishes to pursue HD and would like for [place] to speak a tertiary care center for possible transfer.  Disposition Plan very poor prognosis, very limited medically.  Will discuss with [facility] neurologist/hospitalist for potential transfer to tertiary care center if they are in agreement to pursue HD        Objective: Vitals:   02/11/18 2231 02/12/18 0500 02/12/18 0717 02/12/18 1711  BP: 120/63  126/66 (!) 140/59  Pulse: 66  77 77  Resp: 20  18 (!) 22  Temp:   99 F (37.2 C) 98.7 F (37.1 C)  TempSrc:   Axillary   SpO2: 100%  100% 100%  Weight:  73.5 kg    Height:        Intake/Output Summary (Last 24 hours) at 02/12/2018 2039 Last data filed at 02/12/2018 2000 Gross per 24 hour  Intake 1774 ml  Output -  Net 1774 ml   Filed Weights   02/08/18 0450 02/09/18 0500 02/12/18 0500  Weight: 78.3 kg 71.3 kg 73.5 kg    Exam:  Constitutional: Elderly female, chronically ill-appearing, no distress Eyes: EOMI, anicteric Cardiovascular: RRR no MRGs, 2+ pitting edema of bilateral lower extremities ( ankle to thigh) Respiratory: Normal respiratory effort on 2 L,  difficult to auscultate given positioning in bed Abdomen: Soft,non-tender, umbilical hernia present with non-mobile ,large hardened lymph node nontender with deep palpation, Skin: No rash ulcers, or lesions. Without skin tenting  Neurologic: Grunting, not following commands, randomly moving right arm above and below head, does not move left arm on own  Data Reviewed: CBC: Recent Labs  Lab 02/07/18 0616  02/08/18 1131 02/09/18 0641 02/10/18 1558 02/11/18 0407 02/12/18 0616  WBC 7.6   < > 7.7 7.9 8.1 6.6 7.2  NEUTROABS 5.7  --   --   --   --   --   --   HGB 7.2*   < > 7.5* 7.4* 7.4* 7.7* 7.1*  HCT 23.8*   < > 24.8* 24.3* 25.0* 24.2* 23.2*  MCV 99.6   < > 97.6 96.4 97.7 97.2 99.1  PLT 242   < > 230 226 243 207 212   < > = values  in this interval not displayed.   Basic Metabolic Panel: Recent Labs  Lab 02/08/18 1016 02/09/18 0641 02/10/18 1558 02/11/18 0407 02/12/18 0616  NA 134* 135 135 133* 133*  K 5.0 5.3* 5.6* 5.5* 5.5*  CL 99 98 98 96* 97*  CO2 25 25 26 26 25   GLUCOSE 163* 120* 131* 69* 122*  BUN 127* 129* 128* 132* 132*  CREATININE 3.54* 3.70* 3.65* 3.58* 3.74*  CALCIUM 7.8* 8.4* 8.6* 8.4* 8.2*   GFR: Estimated Creatinine Clearance: 10 mL/min (A) (by C-G formula based on SCr of 3.74 mg/dL (H)). Liver Function Tests: No results for input(s): AST, ALT, ALKPHOS, BILITOT, PROT, ALBUMIN in the last 168 hours. No results for input(s): LIPASE, AMYLASE in the last 168 hours. No results for input(s): AMMONIA in the last 168 hours. Coagulation Profile: No results for input(s): INR, PROTIME in the last 168 hours. Cardiac Enzymes: No results for input(s): CKTOTAL, CKMB, CKMBINDEX, TROPONINI in the last 168 hours. BNP (last 3 results) No results for input(s): PROBNP in the last 8760 hours. HbA1C: No results for input(s): HGBA1C in the last 72 hours. CBG: Recent Labs  Lab 02/12/18 0420 02/12/18 0718 02/12/18 1222 02/12/18 1707 02/12/18 1921  GLUCAP 116* 101* 131* 92 75  Lipid Profile: No results for input(s): CHOL, HDL, LDLCALC, TRIG, CHOLHDL, LDLDIRECT in the last 72 hours. Thyroid Function Tests: No results for input(s): TSH, T4TOTAL, FREET4, T3FREE, THYROIDAB in the last 72 hours. Anemia Panel: No results for input(s): VITAMINB12, FOLATE, FERRITIN, TIBC, IRON, RETICCTPCT in the last 72 hours. Urine analysis:    Component Value Date/Time   COLORURINE YELLOW 12/23/2017 1324   APPEARANCEUR CLOUDY (A) 12/23/2017 1324   LABSPEC 1.010 12/23/2017 1324   PHURINE 7.0 12/23/2017 1324   GLUCOSEU NEGATIVE 12/23/2017 1324   HGBUR MODERATE (A) 12/23/2017 1324   BILIRUBINUR NEGATIVE 12/23/2017 1324   KETONESUR NEGATIVE 12/23/2017 1324   PROTEINUR 100 (A) 12/23/2017 1324   UROBILINOGEN 0.2 06/30/2011  1214   NITRITE NEGATIVE 12/23/2017 1324   LEUKOCYTESUR LARGE (A) 12/23/2017 1324   Sepsis Labs: @LABRCNTIP (procalcitonin:4,lacticidven:4)  )No results found for this or any previous visit (from the past 240 hour(s)).    Studies: No results found.  Scheduled Meds: . atorvastatin  40 mg Per Tube q1800  . chlorhexidine  15 mL Mouth Rinse BID  . cholecalciferol  400 Units Per Tube Daily  . clopidogrel  75 mg Per Tube Daily  . collagenase   Topical Daily  . docusate  100 mg Per Tube Daily  . famotidine  10 mg Oral Daily  . feeding supplement (JEVITY 1.2 CAL)  1,000 mL Per Tube Q24H  . ferrous sulfate  300 mg Per Tube Daily  . free water  250 mL Per Tube Q8H  . insulin aspart  0-15 Units Subcutaneous Q4H  . insulin detemir  12 Units Subcutaneous Daily  . isosorbide-hydrALAZINE  0.5 tablet Per Tube TID  . mouth rinse  15 mL Mouth Rinse q12n4p  . metoprolol tartrate  25 mg Per Tube BID  . multivitamin  15 mL Per Tube Daily    Continuous Infusions: . sodium chloride Stopped (01/08/18 2158)     LOS: 22 days     Desiree Hane, MD [facility] Pager [phone]  If 7PM-7AM, please contact night-coverage [email or web address] Password [facility] 02/12/2018, 8:39 PM

## 2018-02-12 NOTE — Final Consult Note (Signed)
[facility] Progress Note     Subjective: CC: umbilical LN Patient resting in bed. No family at bedside while I was present.  Patient reports she does not feel well but did not elaborate for me.   Objective: Vital signs in last 24 hours: Temp:  [98.2 F (36.8 C)-99 F (37.2 C)] 99 F (37.2 C) (01/13 0717) Pulse Rate:  [66-87] 77 (01/13 0717) Resp:  [18-20] 18 (01/13 0717) BP: (91-157)/(63-71) 126/66 (01/13 0717) SpO2:  [100 %] 100 % (01/13 0717) Weight:  [73.5 kg] 73.5 kg (01/13 0500) Last BM Date: 02/10/18  Intake/Output from previous day: 01/12 0701 - 01/13 0700 In: 480 [NG/GT:480] Out: 350 [Urine:350] Intake/Output this shift: No intake/output data recorded.  PE: Gen:  Alert, NAD Card:  Regular rate and rhythm Pulm:  Normal effort, clear to auscultation bilaterally Abd: Soft, round/smooth palpable node in umbilical hernia, no distention, +BS   Lab Results:  Recent Labs    02/11/18 0407 02/12/18 0616  WBC 6.6 7.2  HGB 7.7* 7.1*  HCT 24.2* 23.2*  PLT 207 212   BMET Recent Labs    02/11/18 0407 02/12/18 0616  NA 133* 133*  K 5.5* 5.5*  CL 96* 97*  CO2 26 25  GLUCOSE 69* 122*  BUN 132* 132*  CREATININE 3.58* 3.74*  CALCIUM 8.4* 8.2*   PT/INR No results for input(s): LABPROT, INR in the last 72 hours. CMP     Component Value Date/Time   NA 133 (L) 02/12/2018 0616   K 5.5 (H) 02/12/2018 0616   CL 97 (L) 02/12/2018 0616   CO2 25 02/12/2018 0616   GLUCOSE 122 (H) 02/12/2018 0616   BUN 132 (H) 02/12/2018 0616   CREATININE 3.74 (H) 02/12/2018 0616   CALCIUM 8.2 (L) 02/12/2018 0616   CALCIUM 10.2 11/23/2017 0910   PROT 6.2 (L) 01/22/2018 0900   ALBUMIN 2.1 (L) 01/22/2018 0900   AST 24 01/22/2018 0900   ALT 32 01/22/2018 0900   ALKPHOS 62 01/22/2018 0900   BILITOT 0.6 01/22/2018 0900   GFRNONAA 10 (L) 02/12/2018 0616   GFRAA 12 (L) 02/12/2018 0616   Lipase     Component Value Date/Time   LIPASE 33 11/04/2017 1110        Studies/Results: No results found.  Anti-infectives: Anti-infectives (From admission, onward)   Start     Dose/Rate Route Frequency Ordered Stop   01/03/18 1000  meropenem (MERREM) 1 g in sodium chloride 0.9 % 100 mL IVPB     1 g 200 mL/hr over 30 Minutes Intravenous Every 12 hours 01/03/18 0924 01/09/18 0945   01/01/18 0700  piperacillin-tazobactam (ZOSYN) IVPB 2.25 g  Status:  Discontinued     2.25 g 100 mL/hr over 30 Minutes Intravenous Every 8 hours 01/01/18 0649 01/03/18 0924   12/29/17 1300  piperacillin-tazobactam (ZOSYN) IVPB 3.375 g     3.375 g 12.5 mL/hr over 240 Minutes Intravenous Every 12 hours 12/29/17 1233 12/31/17 0700   12/26/17 2200  vancomycin (VANCOCIN) IVPB 750 mg/150 ml premix  Status:  Discontinued     750 mg 150 mL/hr over 60 Minutes Intravenous Every 48 hours 12/25/17 1520 12/28/17 0804   12/25/17 2200  piperacillin-tazobactam (ZOSYN) IVPB 2.25 g  Status:  Discontinued     2.25 g 100 mL/hr over 30 Minutes Intravenous Every 8 hours 12/25/17 1520 12/29/17 1233   12/24/17 0030  vancomycin (VANCOCIN) 500 mg in sodium chloride 0.9 % 100 mL IVPB  Status:  Discontinued  500 mg 100 mL/hr over 60 Minutes Intravenous Every 24 hours 12/24/17 0017 12/25/17 1520   12/24/17 0030  piperacillin-tazobactam (ZOSYN) IVPB 3.375 g  Status:  Discontinued     3.375 g 12.5 mL/hr over 240 Minutes Intravenous Every 8 hours 12/24/17 0017 12/25/17 1520   12/10/17 0919  ceFAZolin (ANCEF) IVPB 1 g/50 mL premix  Status:  Discontinued     over 30 Minutes Intravenous Continuous PRN 12/10/17 0919 12/17/17 1336   12/10/17 0859  ceFAZolin (ANCEF) 2-4 GM/100ML-% IVPB    Note to Pharmacy:  Margaretmary Dys   : cabinet override      12/10/17 0859 12/10/17 2114   12/08/17 2200  ampicillin (OMNIPEN) 1 g in sodium chloride 0.9 % 100 mL IVPB  Status:  Discontinued     1 g 300 mL/hr over 20 Minutes Intravenous Every 8 hours 12/08/17 1535 12/12/17 1837   12/07/17 0600  ceFAZolin (ANCEF)  IVPB 2g/100 mL premix     2 g 200 mL/hr over 30 Minutes Intravenous To Short Stay 12/06/17 1219 12/08/17 0600   12/06/17 1200  cefTRIAXone (ROCEPHIN) 1 g in sodium chloride 0.9 % 100 mL IVPB  Status:  Discontinued     1 g 200 mL/hr over 30 Minutes Intravenous Every 24 hours 12/05/17 1947 12/08/17 1535   12/05/17 1445  cefTRIAXone (ROCEPHIN) 1 g in sodium chloride 0.9 % 100 mL IVPB     1 g 200 mL/hr over 30 Minutes Intravenous  Once 12/05/17 1441 12/05/17 1622       Assessment/Plan Multiple medical issues including: Acute kidney injury -creatinine 3.70 Hx CHF -EF 33%. Acute hypoxic respiratory failure Cute metabolic encephalopathy Dysphasia Vaginal bleeding secondary to uterine fibroids ESBL -Klebsiella aspiration pneumonia -enterococcus UTI -antibiotics completed NSTEMI -resolved Left temporoparietal punctate CVA Tardive dyskinesia  Umbilical lymph node - CT showed that this is enlarging. Family does not desire core biopsy. I see no signs of necrosis or infection. No indication for surgical intervention. We will sign off.       LOS: 65 days    Brigid Re , [facility] 02/12/2018, 11:50 AM Pager: [phone] Consults: [phone] Mon-Fri 7:00 am-4:30 pm Sat-Sun 7:00 am-11:30 am

## 2018-02-13 LAB — BASIC METABOLIC PANEL
Anion gap: 13 (ref 5–15)
BUN: 135 mg/dL — ABNORMAL HIGH (ref 8–23)
CO2: 24 mmol/L (ref 22–32)
CREATININE: 3.98 mg/dL — AB (ref 0.44–1.00)
Calcium: 8.4 mg/dL — ABNORMAL LOW (ref 8.9–10.3)
Chloride: 97 mmol/L — ABNORMAL LOW (ref 98–111)
GFR calc Af Amer: 11 mL/min — ABNORMAL LOW (ref >60)
GFR calc non Af Amer: 9 mL/min — ABNORMAL LOW (ref >60)
GLUCOSE: 147 mg/dL — AB (ref 70–99)
Potassium: 5.6 mmol/L — ABNORMAL HIGH (ref 3.5–5.1)
Sodium: 134 mmol/L — ABNORMAL LOW (ref 135–145)

## 2018-02-13 LAB — GLUCOSE, CAPILLARY
Glucose-Capillary: 137 mg/dL — ABNORMAL HIGH (ref 70–99)
Glucose-Capillary: 150 mg/dL — ABNORMAL HIGH (ref 70–99)
Glucose-Capillary: 156 mg/dL — ABNORMAL HIGH (ref 70–99)
Glucose-Capillary: 173 mg/dL — ABNORMAL HIGH (ref 70–99)
Glucose-Capillary: 147 mg/dL — ABNORMAL HIGH (ref 70–99)
Glucose-Capillary: 139 mg/dL — ABNORMAL HIGH (ref 70–99)

## 2018-02-13 LAB — CBC
HCT: 23.1 % — ABNORMAL LOW (ref 36.0–46.0)
Hemoglobin: 7.3 g/dL — ABNORMAL LOW (ref 12.0–15.0)
MCH: 30.5 pg (ref 26.0–34.0)
MCHC: 31.6 g/dL (ref 30.0–36.0)
MCV: 96.7 fL (ref 80.0–100.0)
PLATELETS: 217 10*3/uL (ref 150–400)
RBC: 2.39 MIL/uL — AB (ref 3.87–5.11)
RDW: 16.9 % — ABNORMAL HIGH (ref 11.5–15.5)
WBC: 8.3 10*3/uL (ref 4.0–10.5)
nRBC: 0 % (ref 0.0–0.2)

## 2018-02-13 NOTE — Progress Notes (Signed)
PROGRESS NOTE  [name] JSE:[id number] DOB: 11-08-1927 DOA: 12/05/2017 PCP: Deland Pretty, MD  HPI/Brief Narrative  [name] is a 85 y.o. year old female with medical history significant for CAD s/p CABG, recent ischemic stroke, HTN, DM, HLD, stage 3 CKD recent admission from 10/5-10/25 for failure to thrive with profound weight loss due to swallowing problems s/p botox injection for achalasia, who presented on  12/05/2017 for admission from her SNF due to difficulty speaking which progressed tp aphasia.  Patient has had prolonged hospital course significant for peg tube placement by IR (12/11/2018) due to dysphagia from achalasia, NSTEMI, respiratory failure requiring intubation (12/2-12/6, extubated), Enterococcus UTI/Klebsiella pneumonia, anemia and now worsening renal failure. Palliative has been involved intermittently throughout hospital stay, but her son wants FULL code as he feels strongly she will not receive adequate care if she is made DRN/DNI.  We have tried potential transfer to tertiary care center [facility]) but they are at capacity as of 02/13/2018.  The case was reviewed with the on-call nephrologist at [facility] nephrologist in a consultation capacity on 02/13/2018 who agreed with our nephrologist's assessment that the patient is not a dialysis candidate given her multiple comorbidities and age she would not tolerate this procedure and advised continue goals of care discussions.  The assessment of the on-call [facility] nephrologist was shared with the patient's son Dr. Erick Alley, who is a surgeon (plastic?),  On the evening of 02/13/2018.  He does not agree with their evaluation and would like to talk with the nephrologist to understand clinical reasoning.  He states if he needs to he will speak with the director of nephrology at [facility] who he knows well.  Additionally, he mentions that [facility] is unable to accept her as a transfer he will try [facility] or other [facility]  area hospitals.  He would also like to speak with the director of our advanced heart failure program Dr. Haroldine Laws at his evaluation as well.  Hospital events 11/10 PEG placed poor oral intake related 11/19 General surgery consulted, found to have nonincarcerated umbilical hernia no surgical needs and lymph node within hernia. 11/21 developed respiratory failure due to to significant volume overload from CHF exacerbation, treated aggressively with IV diuretics, NSTEMI medically managed, EF found to be 30% at that time 11/27 ENT consulted for stridor, flexible endoscopy negative 12/2 intubated for persistent pulmonary edema/hypervolemic status from heart failure flare (critical care was consulted for agonal respirations prompting transfer to ICU), with extensive IV diuresis, extubated 12/6 12/17 persistent AKI/worsening of GFR 12/18 transfer to progressive unit  1/7, nephrology deems not a dialysis candidate given age and multiple comorbidities/Poor functional status, progressive worsening kidney dysfunction, not improving with any medical management for CHF flare 1/12 advanced heart failure does not recommend any further medical management for CHF with reduced EF, and that encourages goals of care discussion 1/13 patient son like to transfer to tertiary care center for initiation of dialysis  Subjective This morning patient able to say good morning to me Also saying "get me out of here".  "I am sick".  Later this evening patient had an emesis episode x1 while sitting in bed had to be propped up to prevent aspiration.  Her son was at the bedside asking her arithmetic problems (2+2, 8+8, 4+4, which the patient answered correctly) otherwise she answered all the questions with intermittent yes and noes appropriately but did not expound with any complex sentences. At one point during her conversation with her  son and myself she stated "no more"  Assessment/Plan:  AKI on CKD stage III  (multifactorial etiology),  Seems to be progressing to end-stage renal disease Suspect related to fluid shifts/initial pre-renal etiology  from recent infections and poor oral intake with hypotension (initially treated with IVF), CHF exacerbations, aspiration pneumonia CVA,.  Given persistent vascular congestion, anasarca/hypervolemia suspect the predominant force is cardiorenal syndrome. GFR has been persistently 10-12 for the past week.  Still making minimal urine but close to anuric, persistently hyperkalemic (though mildly), profoundly volume up but maintaining normal saturation on only 2 L.  Had very prolonged discussion with her son Dr. Mayer Masker on 1/13.  Explained our nephrologist recommendation that she is not a dialysis candidate given her multiple comorbidities and age.  But given the relative subacute progression of her renal dysfunction and mental status he would like to try transfer to tertiary care center.  [facility] is currently not a capacity to accept admissions, consulting nephrologist at [facility] on 1/14 reviewed case and agrees that  the patient has overall poor prognosis and would not tolerate HD and advises continue goals of care discussions.  Patient son does not agree and like to speak with [facility] nephrologist and our advanced heart failure director Dr. Haroldine Laws for further clarification.  He would still like transfer to tertiary care center if not [facility] other [place] area hospitals.  Lymph node within umbilical hernia, increased in size Known [facility] lymph node on previous CT evaluations, imaging from 1/10 showed increased size.  Son does not want core biopsy as he is less concern for malignancy given no other evidence of lymphadenopathy on imaging.  Umbilical hernia without incarceration Surgery has evaluated twice most recently on 1/10.  Hardened mass on exam is consistent with lymph node as mentioned above  Goals of care Patient has very poor prognosis.   No progress in goals of care discussions by previous providers.  Ethics committee consulted on 1/11 and Dr. Emeterio Reeve provided advice.  Son believes he is acting in mother's wishes as she has 2 brothers in the late 19s on dialysis and doing well.  Dr. Sheppard Coil recommends touching base with the daughter to ensure she knows she is also a valid decision-maker along side her brother.  Acute hypoxic respiratory failure secondary to CHF with reduced EF (TTE 30% ejection fraction), stable  likely related to hypoalbuminemia in addition to CHF flare, and now worsening b/l pleural effusion on imaging.  She fluctuates between 2 L to normal oxygen saturations on room air.   Discussed with advanced heart failure director on 1/12 who also did not recommend any further medical management; clinical picture concerning for cardiorenal syndrome as patient has been profoundly hypervolemic with evidence of vascular congestion and bilateral pleural effusions in the setting of reduced EF and worsening kidney function.  She has not responded well to medical management.  Due to her worsening kidney function we are unable to use Lasix (nephrology recommended against given patient is not a dialysis candidate in their opinion).  We will continue to hold off on any diuretic therapy given her poor kidney function, continue BiDil for afterload reduction, daily weights, strict I/os.  Son wants to repeat TTE to evaluate EF and discuss options of initiating potential inotropic or loop/non-loop combination diuretics with advanced heart failure.  He is aware of most recent discussion with cardiology who recommend palliative treatment.  Acute metabolic encephalopathy, stable Still fluctuating ability to converse ( says hello/good morning, is  correctly answering simple math equations her son gives her at bedside, intermittently answers questions with yes or no, able to say she is nauseous and in pain intermittently) otherwise non-sensical  or grunts. Likely multifactorial etiology hospital-acquired delirium, uremia related to AKI on CKD, oxygen requirements, suspected vascular dementia related to chronic infarcts in addition to CVA here.  Prior to this admission in September 2019 patient was alert and oriented x4 during most of her ADLs and very well-functioning.  EEG unremarkable.  No longer on Seroquel, delirium precautions  Acute on chronic anemia chronic disease, most likely CKD Baseline hemoglobin prior to hospital stay 10-11.  Has persistently remain 7 to 8 months of hospitalization.  Closely monitor CBCs.  Goal hemoglobin greater than 8, however concern patient will not tolerate volume for blood transfusion given reduced EF and significant pulmonary edema/pleural effusion with limited ability to diurese given kidney function.  Dysphagia related to achalasia with failed botox/moderate malnutrition/failure to thrive Continue PEG tube feeding, would like to increase to appropriate rate to meet nutritional needs but son is against for fear that more protein will worsen her BUN.  Vaginal bleeding secondary to uterine fibroma Hemoglobin remained stable, monitor CBC   Hypernatremia, resolved Secondary to free water deficit, resolved with increasing free water via PEG tube  ESBL Klebsiella aspiration pneumonia Enterococcus UTI Completed antibiotic course  NSTEMI, resolved Cardiology recommended medical management only.  Continue atorvastatin Plavix, Lopressor  Left temporal frontal punctate CVA Evaluated by neurology.  No work-up TTE was unremarkable for vegetation or clot.  We will continue Plavix and atorvastatin  Type 2 diabetes A1c 5.9 (12/2017, down from 9 on 10/2017) fasting glucose consistently in 140s-150s  Currently on detemir 12 units daily while on tube feeds, continue to closely monitor CBGs.  Tardive dyskinesia Diagnosed this hospitalization (01/18/2018) presumed related to reglan.  Cogentin Has been  discontinued( on 02/09/18)   DVT prophylaxis: Consultants: Neurology, cardiology, surgery, critical care/home, IR, gastroenterology    Code Status: Full code  Family Communication:   On 1/14 (evening) discussed with Dr. Erick Alley (patient's son) patient's overall poor prognosis as deemed by our consultants here and agree with by [facility] consultants and discussion of her case.  He like to speak with both cardiology here (specifically Dr. Haroldine Laws, advanced heart failure) and nephrology at [place] very poor prognosis, very limited medically and ways to treat, with course unable to accept as transfer due to no capacity, which was nephrologist does not think patient is dialysis candidate, Dr. Emeterio Reeve recommends potentially setting up a meeting with patient and ethics committee, kidney function is very poor with elevated BUN and GFR of 10 and essentially anuric.        Objective: Vitals:   02/12/18 2352 02/13/18 0209 02/13/18 0748 02/13/18 1622  BP: 125/61  140/78 (!) 145/74  Pulse: 77   83  Resp: 12   (!) 26  Temp: 99 F (37.2 C)  (!) 97.4 F (36.3 C) 97.8 F (36.6 C)  TempSrc: Oral  Oral   SpO2: 98% 98% 96% 100%  Weight:      Height:        Intake/Output Summary (Last 24 hours) at 02/13/2018 1721 Last data filed at 02/13/2018 0700 Gross per 24 hour  Intake 1496 ml  Output 325 ml  Net 1171 ml   Filed Weights   02/08/18 0450 02/09/18 0500 02/12/18 0500  Weight: 78.3 kg 71.3 kg 73.5 kg    Exam:  Constitutional: Elderly  female, chronically ill-appearing, no distress Eyes: EOMI, anicteric Cardiovascular: RRR no MRGs, 2+ pitting edema of bilateral lower extremities ( ankle to thigh) Respiratory: Normal respiratory effort on room air,  difficult to auscultate given positioning in bed Abdomen: Soft,non-tender, umbilical hernia present with non-mobile ,large hardened lymph node nontender with deep palpation, PEG tube in place with no  surrounding erythema Skin: No rash ulcers, or lesions. Without skin tenting  Neurologic: Grunting, answering questions with yes and no with son at bedside, answering arithmetic equations (2+2= 4, 8+8 =16) at bedside (son asking questions), occasionally moving left arm sporadically, does not follow commands Data Reviewed: CBC: Recent Labs  Lab 02/07/18 0616  02/09/18 0641 02/10/18 1558 02/11/18 0407 02/12/18 0616 02/13/18 0500  WBC 7.6   < > 7.9 8.1 6.6 7.2 8.3  NEUTROABS 5.7  --   --   --   --   --   --   HGB 7.2*   < > 7.4* 7.4* 7.7* 7.1* 7.3*  HCT 23.8*   < > 24.3* 25.0* 24.2* 23.2* 23.1*  MCV 99.6   < > 96.4 97.7 97.2 99.1 96.7  PLT 242   < > 226 243 207 212 217   < > = values in this interval not displayed.   Basic Metabolic Panel: Recent Labs  Lab 02/09/18 0641 02/10/18 1558 02/11/18 0407 02/12/18 0616 02/13/18 0500  NA 135 135 133* 133* 134*  K 5.3* 5.6* 5.5* 5.5* 5.6*  CL 98 98 96* 97* 97*  CO2 25 26 26 25 24   GLUCOSE 120* 131* 69* 122* 147*  BUN 129* 128* 132* 132* 135*  CREATININE 3.70* 3.65* 3.58* 3.74* 3.98*  CALCIUM 8.4* 8.6* 8.4* 8.2* 8.4*   GFR: Estimated Creatinine Clearance: 9.4 mL/min (A) (by C-G formula based on SCr of 3.98 mg/dL (H)). Liver Function Tests: No results for input(s): AST, ALT, ALKPHOS, BILITOT, PROT, ALBUMIN in the last 168 hours. No results for input(s): LIPASE, AMYLASE in the last 168 hours. No results for input(s): AMMONIA in the last 168 hours. Coagulation Profile: No results for input(s): INR, PROTIME in the last 168 hours. Cardiac Enzymes: No results for input(s): CKTOTAL, CKMB, CKMBINDEX, TROPONINI in the last 168 hours. BNP (last 3 results) No results for input(s): PROBNP in the last 8760 hours. HbA1C: No results for input(s): HGBA1C in the last 72 hours. CBG: Recent Labs  Lab 02/13/18 0319 02/13/18 0741 02/13/18 0742 02/13/18 1139 02/13/18 1615  GLUCAP 137* 150* 156* 173* 147*   Lipid Profile: No results for  input(s): CHOL, HDL, LDLCALC, TRIG, CHOLHDL, LDLDIRECT in the last 72 hours. Thyroid Function Tests: No results for input(s): TSH, T4TOTAL, FREET4, T3FREE, THYROIDAB in the last 72 hours. Anemia Panel: No results for input(s): VITAMINB12, FOLATE, FERRITIN, TIBC, IRON, RETICCTPCT in the last 72 hours. Urine analysis:    Component Value Date/Time   COLORURINE YELLOW 12/23/2017 1324   APPEARANCEUR CLOUDY (A) 12/23/2017 1324   LABSPEC 1.010 12/23/2017 1324   PHURINE 7.0 12/23/2017 1324   GLUCOSEU NEGATIVE 12/23/2017 1324   HGBUR MODERATE (A) 12/23/2017 1324   BILIRUBINUR NEGATIVE 12/23/2017 1324   KETONESUR NEGATIVE 12/23/2017 1324   PROTEINUR 100 (A) 12/23/2017 1324   UROBILINOGEN 0.2 06/30/2011 1214   NITRITE NEGATIVE 12/23/2017 1324   LEUKOCYTESUR LARGE (A) 12/23/2017 1324   Sepsis Labs: @LABRCNTIP (procalcitonin:4,lacticidven:4)  )No results found for this or any previous visit (from the past 240 hour(s)).    Studies: No results found.  Scheduled Meds: . atorvastatin  40 mg Per Tube  q1800  . chlorhexidine  15 mL Mouth Rinse BID  . cholecalciferol  400 Units Per Tube Daily  . clopidogrel  75 mg Per Tube Daily  . collagenase   Topical Daily  . docusate  100 mg Per Tube Daily  . famotidine  10 mg Oral Daily  . feeding supplement (JEVITY 1.2 CAL)  1,000 mL Per Tube Q24H  . ferrous sulfate  300 mg Per Tube Daily  . free water  250 mL Per Tube Q8H  . insulin aspart  0-15 Units Subcutaneous Q4H  . insulin detemir  12 Units Subcutaneous Daily  . isosorbide-hydrALAZINE  0.5 tablet Per Tube TID  . mouth rinse  15 mL Mouth Rinse q12n4p  . metoprolol tartrate  25 mg Per Tube BID  . multivitamin  15 mL Per Tube Daily    Continuous Infusions: . sodium chloride Stopped (01/08/18 2158)     LOS: 66 days     Desiree Hane, MD [facility] Pager [phone]  If 7PM-7AM, please contact night-coverage [email or web address] Password [facility] 02/13/2018, 5:21 PM

## 2018-02-14 LAB — CBC
HEMATOCRIT: 23.4 % — AB (ref 36.0–46.0)
HEMOGLOBIN: 7.2 g/dL — AB (ref 12.0–15.0)
MCH: 29.3 pg (ref 26.0–34.0)
MCHC: 30.8 g/dL (ref 30.0–36.0)
MCV: 95.1 fL (ref 80.0–100.0)
Platelets: 216 10*3/uL (ref 150–400)
RBC: 2.46 MIL/uL — ABNORMAL LOW (ref 3.87–5.11)
RDW: 16.6 % — ABNORMAL HIGH (ref 11.5–15.5)
WBC: 7 10*3/uL (ref 4.0–10.5)
nRBC: 0 % (ref 0.0–0.2)

## 2018-02-14 LAB — URINALYSIS, COMPLETE (UACMP) WITH MICROSCOPIC
Bilirubin Urine: NEGATIVE
Glucose, UA: NEGATIVE mg/dL
Ketones, ur: NEGATIVE mg/dL
Nitrite: NEGATIVE
Protein, ur: 100 mg/dL — AB
Specific Gravity, Urine: 1.014 (ref 1.005–1.030)
WBC, UA: >50 WBC/hpf — ABNORMAL HIGH (ref 0–5)
pH: 7 (ref 5.0–8.0)

## 2018-02-14 LAB — BASIC METABOLIC PANEL
Anion gap: 11 (ref 5–15)
BUN: 134 mg/dL — ABNORMAL HIGH (ref 8–23)
CO2: 25 mmol/L (ref 22–32)
Calcium: 8.5 mg/dL — ABNORMAL LOW (ref 8.9–10.3)
Chloride: 97 mmol/L — ABNORMAL LOW (ref 98–111)
Creatinine, Ser: 4.07 mg/dL — ABNORMAL HIGH (ref 0.44–1.00)
GFR calc Af Amer: 11 mL/min — ABNORMAL LOW (ref >60)
GFR, EST NON AFRICAN AMERICAN: 9 mL/min — AB (ref >60)
Glucose, Bld: 129 mg/dL — ABNORMAL HIGH (ref 70–99)
Potassium: 5.7 mmol/L — ABNORMAL HIGH (ref 3.5–5.1)
Sodium: 133 mmol/L — ABNORMAL LOW (ref 135–145)

## 2018-02-14 LAB — OSMOLALITY, URINE: Osmolality, Ur: 366 mOsm/kg (ref 300–900)

## 2018-02-14 LAB — NA AND K (SODIUM & POTASSIUM), RAND UR
Potassium Urine: 55 mmol/L
Sodium, Ur: <10 mmol/L

## 2018-02-14 LAB — CREATININE, URINE, RANDOM: Creatinine, Urine: 47.9 mg/dL

## 2018-02-14 LAB — GLUCOSE, CAPILLARY
Glucose-Capillary: 106 mg/dL — ABNORMAL HIGH (ref 70–99)
Glucose-Capillary: 111 mg/dL — ABNORMAL HIGH (ref 70–99)
Glucose-Capillary: 114 mg/dL — ABNORMAL HIGH (ref 70–99)
Glucose-Capillary: 126 mg/dL — ABNORMAL HIGH (ref 70–99)
Glucose-Capillary: 153 mg/dL — ABNORMAL HIGH (ref 70–99)
Glucose-Capillary: 162 mg/dL — ABNORMAL HIGH (ref 70–99)
Glucose-Capillary: 78 mg/dL (ref 70–99)

## 2018-02-14 MED ORDER — SODIUM CHLORIDE 0.9 % IV SOLN: INTRAVENOUS | Status: DC

## 2018-02-14 MED ORDER — PATIROMER SORBITEX CALCIUM 8.4 G PO PACK
8.4000 g | PACK | Freq: Every day | ORAL | Status: DC
  Administered 2018-02-14 – 2018-02-26 (×13): 8.4 g via ORAL
  Filled 2018-02-14 (×14): qty 1

## 2018-02-14 MED ORDER — FREE WATER
300.0000 mL | Freq: Three times a day (TID) | Status: DC
  Administered 2018-02-14 – 2018-02-19 (×15): 300 mL

## 2018-02-14 MED ORDER — NEPRO/CARBSTEADY PO LIQD
1000.0000 mL | ORAL | Status: DC
  Administered 2018-02-14 – 2018-02-19 (×6): 1000 mL
  Filled 2018-02-14 (×10): qty 1000

## 2018-02-14 NOTE — Progress Notes (Signed)
  Speech Language Pathology Treatment: Dysphagia  Patient Details Name: [name] MRN: [id number] DOB: 1927/08/26 Today's Date: 02/14/2018 Time: 4098-1191 SLP Time Calculation (min) (ACUTE ONLY): 27 min  Assessment / Plan / Recommendation Clinical Impression  Patient with some improvements in mentation today, very alert, however with largely incoherent speech, intermittently calling out. Oral care complete with removal of dried labial secretions. Patient however then able to self feed with minimal-moderate HOH assistance and verbal cueing for attention to task as patient is severely distracted, fluctuating between pushing SLPs hands/cup/spoon away and then willingly accepting bolus. Pureed solids and thin liquids via small cup sips provided. With the exception of mild anterior labial spillage of liquids, oral transit of bolus appearing timely and patient with consistent self initiation of swallow, not requiring cues as requiring during previous sessions. No overt indication of aspiration observed. Despite improving swallowing function, flucutations in cognition remain a concern, in conjunction with esophageal deficits, for increased aspiration risk. Will continue to f/u. If mentation does continue to improve, patient may very well be able to resume pos.    HPI HPI: 85 y.o. female with CAD s/p CABG, recent ischemic stroke, HTN, DM, HLP, recent failure to thrive due to swallowing problems s/p botox injection for achalasia, developed acute pulmonary edema and NSTEMI on 12/20/2017. Bedside swallow on 11/05/17 revealed functional oropharyngeal swallow and suspected esophageal dysphagia. Had esophagram 11/06/17 which showed moderately large hiatal hernia with poor motility and retained barium throughout the esophagus. PEG 12/10/17.  Repeat bedside swallow eval 12/23/17 with recs for NPO secondary to poor MS, no oral anticipation/recognition of POs. Pt with no change in MS that would allow safe oral intake  between 11/23 and date of intubation,12/2.  Extubated 12/6. New orders for repeat bedside swallow eval s/p extubation.      SLP Plan  Continue with current plan of care       Recommendations  Diet recommendations: NPO Medication Administration: Via alternative means                Oral Care Recommendations: Oral care QID Follow up Recommendations: Skilled Nursing facility SLP Visit Diagnosis: Dysphagia, unspecified (R13.10) Plan: Continue with current plan of care       GO             Channelle Bottger MA, [place] 02/14/2018, 11:54 AM

## 2018-02-14 NOTE — Progress Notes (Signed)
PROGRESS NOTE  Shanitha Twining TKP:[id number] DOB: 29-May-1927 DOA: 12/05/2017 PCP: Deland Pretty, MD  HPI/Brief Narrative  Narcissus Detwiler is a 85 y.o. year old female with medical history significant for CAD s/p CABG, recent ischemic stroke, HTN, DM, HLD, stage 3 CKD recent admission from 10/5-10/25 for failure to thrive with profound weight loss due to swallowing problems s/p botox injection for achalasia, who presented on  12/05/2017 for admission from her SNF due to difficulty speaking which progressed tp aphasia.  Patient has had prolonged hospital course significant for peg tube placement by IR (12/11/2018) due to dysphagia from achalasia, NSTEMI, respiratory failure requiring intubation (12/2-12/6, extubated), Enterococcus UTI/Klebsiella pneumonia, anemia and now worsening renal failure. Palliative has been involved intermittently throughout hospital stay, but her son wants FULL code as he feels strongly she will not receive adequate care if she is made DRN/DNI.  We have tried potential transfer to tertiary care center [facility]) but they are at capacity as of 02/13/2018.  The case was reviewed with the on-call nephrologist at [facility] nephrologist in a consultation capacity on 02/13/2018 who agreed with our nephrologist's assessment that the patient is not a dialysis candidate given her multiple comorbidities and age she would not tolerate this procedure and advised continue goals of care discussions.  The assessment of the on-call [facility] nephrologist was shared with the patient's son Dr. Erick Alley, who is a surgeon (plastic?),  On the evening of 02/13/2018.  He does not agree with their evaluation and would like to talk with the nephrologist to understand clinical reasoning.  He states if he needs to he will speak with the director of nephrology at [facility] who he knows well.  Additionally, [facility] he will try [facility] or other [facility] area hospitals.  He would also like to speak with the director of our advanced heart failure program Dr. Haroldine Laws at his evaluation as well.  Hospital events 11/10 PEG placed poor oral intake related 11/19 General surgery consulted, found to have nonincarcerated umbilical hernia no surgical needs and lymph node within hernia. 11/21 developed respiratory failure due to to significant volume overload from CHF exacerbation, treated aggressively with IV diuretics, NSTEMI medically managed, EF found to be 30% at that time 11/27 ENT consulted for stridor, flexible endoscopy negative 12/2 intubated for persistent pulmonary edema/hypervolemic status from heart failure flare (critical care was consulted for agonal respirations prompting transfer to ICU), with extensive IV diuresis, extubated 12/6 12/17 persistent AKI/worsening of GFR 12/18 transfer to progressive unit  1/7, nephrology deems not a dialysis candidate given age and multiple comorbidities/Poor functional status, progressive worsening kidney dysfunction, not improving with any medical management for CHF flare 1/12 advanced heart failure does not recommend any further medical management for CHF with reduced EF, and that encourages goals of care discussion 1/13 patient son like to transfer to tertiary care center for initiation of dialysis,  1/14 I personally discussed with [facility] as well as [facility] both of home currently are at full capacity and unable to take the patient.  Patient is not placed on any wait list on both facilities.  Changing Jevity to Nepro and increasing free water.  Subjective Occasionally agitated.  Morning.  No nausea no vomiting.  Follows occasional command.  Assessment/Plan:  AKI on CKD stage III (multifactorial etiology),  Seems to be progressing to end-stage renal disease Suspect related to fluid shifts/initial pre-renal etiology  from recent infections and poor oral intake  with hypotension (initially treated with IVF), CHF exacerbations, aspiration pneumonia CVA,.  Given persistent vascular congestion, anasarca/hypervolemia suspect the predominant force is cardiorenal syndrome. GFR has been persistently 10-12 for the past week.  Still making minimal urine but close to anuric, persistently hyperkalemic (though mildly), profoundly volume up but maintaining normal saturation on only 2 L.  Had very prolonged discussion with her son Dr. Mayer Masker on 1/13.  Explained our nephrologist recommendation that she is not a dialysis candidate given her multiple comorbidities and age.  But given the relative subacute progression of her renal dysfunction and mental status he would like to try transfer to tertiary care center.  [facility] is [facility] not a [facility] to [facility] admissions, consulting nephrologist at [facility] on 1/14 reviewed case and agrees that  the patient has overall poor prognosis and would not tolerate HD and advises continue goals of care discussions.  Patient son does not agree and like to speak with [facility] nephrologist and our advanced heart failure director Dr. Haroldine Laws for further clarification.  He would still like transfer to tertiary care center if not [facility] other [place] area hospitals. We will transition her from [place] to Nepro. Fena 0.6, prerenal versus cardiorenal although the patient's weight is going up as well as patient has third spacing in her leg therefore consistent with cardiorenal right now.  Lymph node within umbilical hernia, increased in size Known [facility] lymph node on previous CT evaluations, imaging from 1/10 showed increased size.  Son does not want core biopsy as he is less concern for malignancy given no other evidence of lymphadenopathy on imaging.  Umbilical hernia without incarceration Surgery has evaluated twice most recently on 1/10.  Hardened mass on exam is consistent with lymph node as mentioned above  Goals of  care Patient has very poor prognosis.  No progress in goals of care discussions by previous providers.  Ethics committee consulted on 1/11 and Dr. Emeterio Reeve provided advice.  Son believes he is acting in mother's wishes as she has 2 brothers in the late 90s on dialysis and doing well.  Dr. Sheppard Coil recommends touching base with the daughter to ensure she knows she is also a valid decision-maker along side her brother.  Acute hypoxic respiratory failure secondary to CHF with reduced EF (TTE 30% ejection fraction), stable  likely related to hypoalbuminemia in addition to CHF flare, and now worsening b/l pleural effusion on imaging.  She fluctuates between 2 L to normal oxygen saturations on room air.   Discussed with advanced heart failure director on 1/12 who also did not recommend any further medical management; clinical picture concerning for cardiorenal syndrome as patient has been profoundly hypervolemic with evidence of vascular congestion and bilateral pleural effusions in the setting of reduced EF and worsening kidney function.  She has not responded well to medical management.  Due to her worsening kidney function we are unable to use Lasix (nephrology recommended against given patient is not a dialysis candidate in their opinion).  We will continue to hold off on any diuretic therapy given her poor kidney function, continue BiDil for afterload reduction, daily weights, strict I/os.  Son wants to repeat TTE to evaluate EF and discuss options of initiating potential inotropic or loop/non-loop combination diuretics with advanced heart failure.  He is aware of most recent discussion with cardiology who recommend palliative treatment.  Acute metabolic encephalopathy, stable Still fluctuating ability to converse ( says hello/good  morning, is correctly answering simple math equations her son gives her at bedside, intermittently answers questions with yes or no, able to say she is nauseous and in  pain intermittently) otherwise non-sensical or grunts. Likely multifactorial etiology hospital-acquired delirium, uremia related to AKI on CKD, oxygen requirements, suspected vascular dementia related to chronic infarcts in addition to CVA here.  Prior to this admission in September 2019 patient was alert and oriented x4 during most of her ADLs and very well-functioning.  EEG unremarkable.  No longer on Seroquel, delirium precautions  Acute on chronic anemia chronic disease, most likely CKD Baseline hemoglobin prior to hospital stay 10-11.  Has persistently remain 7 to 8 months of hospitalization.  Closely monitor CBCs.  Goal hemoglobin greater than 8, however concern patient will not tolerate volume for blood transfusion given reduced EF and significant pulmonary edema/pleural effusion with limited ability to diurese given kidney function.  Dysphagia related to achalasia with failed botox/moderate malnutrition/failure to thrive Continue PEG tube feeding, would like to increase to appropriate rate to meet nutritional needs but son is against for fear that more protein will worsen her BUN.  Vaginal bleeding secondary to uterine fibroma Hemoglobin remained stable, monitor CBC   Hypernatremia, resolved Secondary to free water deficit, resolved with increasing free water via PEG tube  ESBL Klebsiella aspiration pneumonia Enterococcus UTI Completed antibiotic course  NSTEMI, resolved Cardiology recommended medical management only.  Continue atorvastatin Plavix, Lopressor  Left temporal frontal punctate CVA Evaluated by neurology.  No work-up TTE was unremarkable for vegetation or clot.  We will continue Plavix and atorvastatin  Type 2 diabetes A1c 5.9 (12/2017, down from 9 on 10/2017) fasting glucose consistently in 140s-150s  Currently on detemir 12 units daily while on tube feeds, continue to closely monitor CBGs.  Tardive dyskinesia Diagnosed this hospitalization (01/18/2018) presumed  related to reglan.  Cogentin Has been discontinued( on 02/09/18)   DVT prophylaxis: Consultants: Neurology, cardiology, surgery, critical care/home, IR, gastroenterology    Code Status: Full code  Family Communication:   On 1/14 (evening) discussed with Dr. Erick Alley (patient's son) patient's overall poor prognosis as deemed by our consultants here and agree with by [facility] consultants and discussion of her case.  He like to speak with both cardiology here (specifically Dr. Haroldine Laws, advanced heart failure) and nephrology at [place] very poor prognosis, very limited medically and ways to treat, with course unable to accept as transfer due to no capacity, which was nephrologist does not think patient is dialysis candidate, Dr. Emeterio Reeve recommends potentially setting up a meeting with patient and ethics committee, kidney function is very poor with elevated BUN and GFR of 10 and essentially anuric.        Objective: Vitals:   02/14/18 0812 02/14/18 0859 02/14/18 1658 02/14/18 1920  BP: (!) 99/45 103/61 136/89 131/71  Pulse:  70 76 75  Resp:    18  Temp: 97.8 F (36.6 C)  97.6 F (36.4 C) (!) 97.5 F (36.4 C)  TempSrc: Oral  Oral Oral  SpO2:   98%   Weight:      Height:        Intake/Output Summary (Last 24 hours) at 02/14/2018 1943 Last data filed at 02/14/2018 1648 Gross per 24 hour  Intake 1354.67 ml  Output 651 ml  Net 703.67 ml   Filed Weights   02/09/18 0500 02/12/18 0500 02/14/18 0550  Weight: 71.3 kg 73.5 kg 75.2 kg    Exam:  Constitutional:  Elderly female, chronically ill-appearing, no distress Eyes: EOMI, anicteric Cardiovascular: RRR no MRGs, 2+ pitting edema of bilateral lower extremities ( ankle to thigh) Respiratory: Normal respiratory effort on room air,  difficult to auscultate given positioning in bed Abdomen: Soft,non-tender, umbilical hernia present with non-mobile ,large hardened lymph node nontender with deep  palpation, PEG tube in place with no surrounding erythema Skin: No rash ulcers, or lesions. Without skin tenting  Neurologic: Grunting, answering questions with yes and no with son at bedside, answering arithmetic equations (2+2= 4, 8+8 =16) at bedside (son asking questions), occasionally moving left arm sporadically, does not follow commands Data Reviewed: CBC: Recent Labs  Lab 02/10/18 1558 02/11/18 0407 02/12/18 0616 02/13/18 0500 02/14/18 0515  WBC 8.1 6.6 7.2 8.3 7.0  HGB 7.4* 7.7* 7.1* 7.3* 7.2*  HCT 25.0* 24.2* 23.2* 23.1* 23.4*  MCV 97.7 97.2 99.1 96.7 95.1  PLT 243 207 212 217 573   Basic Metabolic Panel: Recent Labs  Lab 02/10/18 1558 02/11/18 0407 02/12/18 0616 02/13/18 0500 02/14/18 0515  NA 135 133* 133* 134* 133*  K 5.6* 5.5* 5.5* 5.6* 5.7*  CL 98 96* 97* 97* 97*  CO2 26 26 25 24 25   GLUCOSE 131* 69* 122* 147* 129*  BUN 128* 132* 132* 135* 134*  CREATININE 3.65* 3.58* 3.74* 3.98* 4.07*  CALCIUM 8.6* 8.4* 8.2* 8.4* 8.5*   GFR: Estimated Creatinine Clearance: 9.3 mL/min (A) (by C-G formula based on SCr of 4.07 mg/dL (H)). Liver Function Tests: No results for input(s): AST, ALT, ALKPHOS, BILITOT, PROT, ALBUMIN in the last 168 hours. No results for input(s): LIPASE, AMYLASE in the last 168 hours. No results for input(s): AMMONIA in the last 168 hours. Coagulation Profile: No results for input(s): INR, PROTIME in the last 168 hours. Cardiac Enzymes: No results for input(s): CKTOTAL, CKMB, CKMBINDEX, TROPONINI in the last 168 hours. BNP (last 3 results) No results for input(s): PROBNP in the last 8760 hours. HbA1C: No results for input(s): HGBA1C in the last 72 hours. CBG: Recent Labs  Lab 02/14/18 0353 02/14/18 0809 02/14/18 1138 02/14/18 1653 02/14/18 1917  GLUCAP 114* 126* 162* 111* 106*   Lipid Profile: No results for input(s): CHOL, HDL, LDLCALC, TRIG, CHOLHDL, LDLDIRECT in the last 72 hours. Thyroid Function Tests: No results for input(s):  TSH, T4TOTAL, FREET4, T3FREE, THYROIDAB in the last 72 hours. Anemia Panel: No results for input(s): VITAMINB12, FOLATE, FERRITIN, TIBC, IRON, RETICCTPCT in the last 72 hours. Urine analysis:    Component Value Date/Time   COLORURINE YELLOW 02/14/2018 1238   APPEARANCEUR TURBID (A) 02/14/2018 1238   LABSPEC 1.014 02/14/2018 1238   PHURINE 7.0 02/14/2018 1238   GLUCOSEU NEGATIVE 02/14/2018 1238   HGBUR SMALL (A) 02/14/2018 1238   BILIRUBINUR NEGATIVE 02/14/2018 1238   KETONESUR NEGATIVE 02/14/2018 1238   PROTEINUR 100 (A) 02/14/2018 1238   UROBILINOGEN 0.2 06/30/2011 1214   NITRITE NEGATIVE 02/14/2018 1238   LEUKOCYTESUR LARGE (A) 02/14/2018 1238   Sepsis Labs: @LABRCNTIP (procalcitonin:4,lacticidven:4)  )No results found for this or any previous visit (from the past 240 hour(s)).    Studies: No results found.  Scheduled Meds: . atorvastatin  40 mg Per Tube q1800  . chlorhexidine  15 mL Mouth Rinse BID  . cholecalciferol  400 Units Per Tube Daily  . clopidogrel  75 mg Per Tube Daily  . collagenase   Topical Daily  . famotidine  10 mg Oral Daily  . feeding supplement (NEPRO CARB STEADY)  1,000 mL Per Tube Q24H  . ferrous  sulfate  300 mg Per Tube Daily  . free water  300 mL Per Tube Q8H  . insulin aspart  0-15 Units Subcutaneous Q4H  . insulin detemir  12 Units Subcutaneous Daily  . isosorbide-hydrALAZINE  0.5 tablet Per Tube TID  . mouth rinse  15 mL Mouth Rinse q12n4p  . metoprolol tartrate  25 mg Per Tube BID  . multivitamin  15 mL Per Tube Daily  . patiromer  8.4 g Oral Daily    Continuous Infusions: . sodium chloride Stopped (01/08/18 2158)     LOS: 67 days     Author:  Berle Mull, MD [facility] 02/14/2018  If 7PM-7AM, please contact night-coverage To reach On-call, see [email or web address]   If 7PM-7AM, please contact night-coverage [email or web address] Password [facility] 02/14/2018, 7:43 PM

## 2018-02-14 NOTE — Progress Notes (Signed)
Nutrition Consult/Follow Up  DOCUMENTATION CODES:   Non-severe (moderate) malnutrition in context of chronic illness, Underweight  INTERVENTION:    Nepro Carb Steady at goal rate of 40 ml/hr  Provides 1728 kcals, 77 gm protein, 698 ml free water daily   Continue liquid MVI daily via tube  NUTRITION DIAGNOSIS:   Moderate Malnutrition related to chronic illness(dysphagia 2/2 to achalasia) as evidenced by energy intake < or equal to 75% for > or equal to 1 month, mild fat depletion, moderate fat depletion, mild muscle depletion, moderate muscle depletion, energy intake < or equal to 50% for > or equal to 1 month, ongoing  GOAL:   Patient will meet greater than or equal to 90% of their needs, progressing  MONITOR:   TF tolerance, Labs, Skin, Weight trends, I & O's  ASSESSMENT:   [name] is a 85 y.o. female with medical history significant for DM2, HTN, HLD, CAD s/p CABG, h/o CVA, CKD3, who was recently diagnosed with achalasia after a 50 lb weight loss over the past year. She was treated with botox earlier in October and sent to SNF with the hopes she would be able to swallow her food. She has not done well since that time, has only been able to drink small amounts of milk. Son is a Management consultant in [place] and has been visiting. Yesterday he noticed she was stuttering, having difficulty getting words out, and progressed until she seemed to have total aphasia last night. She does report having dysuria and some lower abd pain. No N/V. Has a good appetite but avoids food because of the obstruction. Megace and remeron have not helped. Process of being worked up for G tube has been initiated and pt reportedly has appt with [facility] tomorrow morning. Son feels like her weight loss and FTT could be reversible if she gets a G tube as she was reportedly very functional a month ago, walking with a walker and performing all of her ADLs.   11/7 - per IR notes, imaging revealed moderate  sized hiatal hernia and colon in upper abdomen, recommending evaluating anatomy with fluoroscopy 11/8 - PICC placed 11/10 - TPN started, IR placed G-tube 11/11 - G-tube cleared for use by RI, TF initiated 11/13 - TPN d/c 11/20 - transitioned to bolus feedings 11/23 - s/p BSE, recommend continued NPO due to AMS, emesis with bolus feedings, rapid response called 11/25 - rapid response, tube feeds held and restarted in the afternoon at 20 ml/hr 11/27 - per MD, tube feeds at 35 ml/hr during the daytime and 30 ml/hr overnight 12/01 - rapid response for increased WOB 12/02 - emesis, TF turned off, intubated 12/06 - extubated, BiPAP at night 12/30 Vital AF 1.2 discontinued, Jevity 1.2 started >> goal rate 55 ml/hr 12/31 Jevity 1.2 decreased to 40 ml/hr per pt's son request   Pt discussed in progression rounds this AM. Pt's son requested TF formula change to Nepro. RD consulted for TF management.   Spoke with RN; Nepro currently infusing at 20 ml/hr via PEG tube. Plan is to titrate by 10 ml every 4 hours to goal rate of 40 ml/hr. Free water flushes increased to 300 ml every 8 hours.  Pt's son would like transfer to tertiary care center for initiation of HD. Labs & medications reviewed. Na 133 (L). K 5.7 (H). CBG's [id number].  Diet Order:   Diet Order            Diet NPO time specified  Diet effective  now             EDUCATION NEEDS:   Not appropriate for education at this time  Skin:  Skin Assessment: Skin Integrity Issues: Skin Integrity Issues:: Stage II Stage II: left buttocks, sacrum Other: partial thickness wounds to bilateral buttocks (likely from adhesive)  Last BM:  1/15    Intake/Output Summary (Last 24 hours) at 02/14/2018 1400 Last data filed at 02/14/2018 1000 Gross per 24 hour  Intake 1174 ml  Output 751 ml  Net 423 ml   Height:   Ht Readings from Last 1 Encounters:  12/20/17 5\' 3"  (1.6 m)   Weight:   Wt Readings from Last 1 Encounters:  02/14/18  75.2 kg   Ideal Body Weight:  52.3 kg  BMI:  Body mass index is 29.37 kg/m.highly skewed given fluid  Estimated Nutritional Needs:  Kcal:  1450-1650  Protein:  75-90 gm  Fluid:  >/= 1.5 L  Arthur Holms, RD, LDN Pager #: [phone] After-Hours Pager #: [phone]

## 2018-02-15 LAB — COMPREHENSIVE METABOLIC PANEL
ALT: 85 U/L — ABNORMAL HIGH (ref 0–44)
AST: 55 U/L — ABNORMAL HIGH (ref 15–41)
Albumin: 1.8 g/dL — ABNORMAL LOW (ref 3.5–5.0)
Alkaline Phosphatase: 78 U/L (ref 38–126)
Anion gap: 10 (ref 5–15)
BUN: 139 mg/dL — ABNORMAL HIGH (ref 8–23)
CALCIUM: 8.5 mg/dL — AB (ref 8.9–10.3)
CO2: 26 mmol/L (ref 22–32)
Chloride: 95 mmol/L — ABNORMAL LOW (ref 98–111)
Creatinine, Ser: 4.14 mg/dL — ABNORMAL HIGH (ref 0.44–1.00)
GFR calc Af Amer: 10 mL/min — ABNORMAL LOW (ref >60)
GFR calc non Af Amer: 9 mL/min — ABNORMAL LOW (ref >60)
Glucose, Bld: 113 mg/dL — ABNORMAL HIGH (ref 70–99)
Potassium: 5.7 mmol/L — ABNORMAL HIGH (ref 3.5–5.1)
SODIUM: 131 mmol/L — AB (ref 135–145)
Total Bilirubin: 0.7 mg/dL (ref 0.3–1.2)
Total Protein: 6.2 g/dL — ABNORMAL LOW (ref 6.5–8.1)

## 2018-02-15 LAB — CBC WITH DIFFERENTIAL/PLATELET
Abs Immature Granulocytes: 0.03 10*3/uL (ref 0.00–0.07)
Basophils Absolute: 0 10*3/uL (ref 0.0–0.1)
Basophils Relative: 0 %
Eosinophils Absolute: 0.1 10*3/uL (ref 0.0–0.5)
Eosinophils Relative: 2 %
HCT: 23.5 % — ABNORMAL LOW (ref 36.0–46.0)
Hemoglobin: 7.1 g/dL — ABNORMAL LOW (ref 12.0–15.0)
Immature Granulocytes: 0 %
Lymphocytes Relative: 11 %
Lymphs Abs: 0.9 10*3/uL (ref 0.7–4.0)
MCH: 29.2 pg (ref 26.0–34.0)
MCHC: 30.2 g/dL (ref 30.0–36.0)
MCV: 96.7 fL (ref 80.0–100.0)
Monocytes Absolute: 0.8 10*3/uL (ref 0.1–1.0)
Monocytes Relative: 10 %
Neutro Abs: 6.1 10*3/uL (ref 1.7–7.7)
Neutrophils Relative %: 77 %
Platelets: 213 10*3/uL (ref 150–400)
RBC: 2.43 MIL/uL — AB (ref 3.87–5.11)
RDW: 16.4 % — ABNORMAL HIGH (ref 11.5–15.5)
WBC: 7.9 10*3/uL (ref 4.0–10.5)
nRBC: 0 % (ref 0.0–0.2)

## 2018-02-15 LAB — GLUCOSE, CAPILLARY
Glucose-Capillary: 105 mg/dL — ABNORMAL HIGH (ref 70–99)
Glucose-Capillary: 107 mg/dL — ABNORMAL HIGH (ref 70–99)
Glucose-Capillary: 114 mg/dL — ABNORMAL HIGH (ref 70–99)
Glucose-Capillary: 114 mg/dL — ABNORMAL HIGH (ref 70–99)
Glucose-Capillary: 129 mg/dL — ABNORMAL HIGH (ref 70–99)
Glucose-Capillary: 132 mg/dL — ABNORMAL HIGH (ref 70–99)

## 2018-02-15 LAB — MAGNESIUM: Magnesium: 2.4 mg/dL (ref 1.7–2.4)

## 2018-02-15 MED ORDER — SODIUM CHLORIDE 0.9 % IV SOLN
INTRAVENOUS | Status: DC
  Administered 2018-02-15 – 2018-02-16 (×3): via INTRAVENOUS

## 2018-02-15 NOTE — Progress Notes (Signed)
Palliative Medicine RN Note: Chart review done. No indication for PMT re-engagement at this time. Note ethics recommendations and decisions, as they are now, from [facility] and [place].  Marjie Skiff Walta [place], RN, BSN, [facility] Palliative Medicine Team 02/15/2018 12:25 PM Office [phone]

## 2018-02-15 NOTE — Ethics Note (Signed)
Ethics Note:   The concerns noted by Dr. Posey Pronto were shared with the Ethics Committee yesterday evening. A suggestion was made to offer the opportunity for a formal Ethics Consultation with patient's son and daughter and the medical providers. This would include two or three members of the consult service and the medical providers and family of patient. Dr. Madison Hickman and myself would be the primary consultants. I would suggest a conversation with Dr. Posey Pronto prior to scheduling this meeting to review concerns and goals and structure for the meeting, assuming the son and daughter are open to this option. Please let me know of the best contact for further conversation.   Kaysie Michelini, [place]

## 2018-02-15 NOTE — Progress Notes (Signed)
PROGRESS NOTE  Diego Delancey FWY:[id number] DOB: 1927-02-10 DOA: 12/05/2017 PCP: Deland Pretty, MD  HPI/Brief Narrative  [name] is a 85 y.o. year old female with medical history significant for CAD s/p CABG, recent ischemic stroke, HTN, DM, HLD, stage 3 CKD recent admission from 10/5-10/25 for failure to thrive with profound weight loss due to swallowing problems s/p botox injection for achalasia, who presented on  12/05/2017 for admission from her SNF due to difficulty speaking which progressed tp aphasia.  Patient has had prolonged hospital course significant for peg tube placement by IR (12/11/2018) due to dysphagia from achalasia, NSTEMI, respiratory failure requiring intubation (12/2-12/6, extubated), Enterococcus UTI/Klebsiella pneumonia, anemia and now worsening renal failure. Palliative has been involved intermittently throughout hospital stay, but her son wants FULL code as he feels strongly she will not receive adequate care if she is made DNR/DNI. Currently active issue is acute kidney injury on chronic kidney disease stage III with progressive uremia and hyperkalemia. This appears to be multifactorial although current primary issue is cardiorenal hemodynamics per nephrology.  Nephrology will with the patient and multiple nephrologist has felt that the patient is not a candidate for hemodialysis. As per family request we have tried potential transfer to tertiary care center ([place]) but they are at capacity and is unable to accept the patient.  the case was reviewed with the on-call nephrologist at [facility] nephrologist in a consultation capacity on 02/13/2018 who agreed with our nephrologist's assessment that the patient is not a dialysis candidate given her multiple comorbidities and age she would not tolerate this procedure and advised continue goals of care discussions.  Hospital events 11/10 PEG placed poor oral intake related 11/19 General surgery consulted, found  to have nonincarcerated umbilical hernia no surgical needs and lymph node within hernia. 11/21 developed respiratory failure due to to significant volume overload from CHF exacerbation, treated aggressively with IV diuretics, NSTEMI medically managed, EF found to be 30% at that time 11/27 ENT consulted for stridor, flexible endoscopy negative 12/2 intubated for persistent pulmonary edema/hypervolemic status from heart failure flare (critical care was consulted for agonal respirations prompting transfer to ICU), with extensive IV diuresis, extubated 12/6 12/17 persistent AKI/worsening of GFR 12/18 transfer to progressive unit 1/7, nephrology deems not a dialysis candidate given age and multiple comorbidities/Poor functional status, progressive worsening kidney dysfunction, not improving with any medical management for CHF flare 1/12 advanced heart failure does not recommend any further medical management for CHF with reduced EF, and that encourages goals of care discussion 1/13 patient son like to transfer to tertiary care center for initiation of dialysis,  1/15 I personally discussed with [facility] as well as [facility] both of which currently are at full capacity and unable to take the patient.  Patient is not placed on any wait list on both facilities.  Changing Jevity to Nepro and increasing free water.  Subjective Lethargic this morning.  Unable to follow any commands.  Minimal responsiveness.  Became agitated at afternoon  Assessment/Plan:  AKI on CKD stage III (multifactorial etiology),  Seems to be progressing to end-stage renal disease Suspect related to fluid shifts/initial pre-renal etiology  from recent infections and poor oral intake with hypotension (initially treated with IVF), CHF exacerbations, aspiration pneumonia CVA,.   Given persistent vascular congestion, anasarca/hypervolemia suspect the predominant force is cardiorenal syndrome.  GFR has been  persistently 10-12 for the past week.  Still making minimal urine but close to anuric, persistently hyperkalemic (  though mildly), profoundly volume up but maintaining normal saturation on only 2 L.  Daily engagement with patient's son Dr. Mayer Masker. Based on my discussion on 02/15/2018 he would like the nephrologist to discuss with him as well as he would like advanced heart failure team consultation. We will transition her from [place] to Nepro. Fena 0.6, prerenal versus cardiorenal although the patient's weight is going up as well as patient has third spacing in her leg therefore consistent with cardiorenal right now.  Lymph node within umbilical hernia, increased in size Known [facility] lymph node on previous CT evaluations, imaging from 1/10 showed increased size.  Son does not want core biopsy as he is less concern for malignancy given no other evidence of lymphadenopathy on imaging.  Umbilical hernia without incarceration Surgery has evaluated twice most recently on 1/10.  Hardened mass on exam is consistent with lymph node as mentioned above  Goals of care Patient has very poor prognosis.  No progress in goals of care discussions by previous providers.   Ethics committee consulted on 1/11 and Dr. Emeterio Reeve provided advice.   Son believes he is acting in mother's wishes as she has 2 brothers in the late 68s on dialysis and doing well.   Dr. Sheppard Coil recommends touching base with the daughter to ensure she knows she is also a valid decision-maker along side her brother. Patient mother concerned with the cardiologist who has seen the patient in the past, patient son has expressed that her mother should be given all the chances possible and if it were not for her he would not be where he is right now.  Acute hypoxic respiratory failure  Acute on chronic systolic CHF She had multiple acute hypoxic respiratory events. Likely related to hypoalbuminemia in addition to CHF flare, and now  worsening b/l pleural effusion on imaging.   She has not responded well to medical management. hold off on any diuretic therapy given her poor kidney function, continue BiDil for afterload reduction, daily weights, strict I/os.   Son wants to repeat TTE to evaluate EF and discuss options of initiating potential inotropic or loop/non-loop combination diuretics with advanced heart failure.  He is aware of most recent discussion with cardiology who recommend palliative treatment.  He is also aware that any measure to improve cardiac output will only be temporary.  Can actually lead to further worsening.  Acute metabolic encephalopathy, stable Still fluctuating ability to converse (Occasionally says hello/good morning, is correctly answering simple math equations her son gives her at bedside, intermittently answers questions with yes or no, able to say she is nauseous and in pain intermittently) otherwise non-sensical or grunts. Likely multifactorial etiology hospital-acquired delirium, uremia related to AKI on CKD, oxygen requirements, suspected vascular dementia related to chronic infarcts in addition to CVA here.  Prior to this admission in September 2019 patient was alert and oriented x4 during most of her ADLs and very well-functioning.  EEG unremarkable.  No longer on Seroquel, delirium precautions  Acute on chronic anemia chronic disease, most likely CKD Baseline hemoglobin prior to hospital stay 10-11.  Has persistently remain 7 to 8 months of hospitalization.  Closely monitor CBCs.  Goal hemoglobin greater than 8, however concern patient will not tolerate volume for blood transfusion given reduced EF and significant pulmonary edema/pleural effusion with limited ability to diurese given kidney function.  Dysphagia related to achalasia with failed botox/moderate malnutrition/failure to thrive Continue PEG tube feeding, would like to increase to appropriate rate to meet nutritional  needs but son is  against for fear that more protein will worsen her BUN.  Vaginal bleeding secondary to uterine fibroma Hemoglobin remained stable, monitor CBC   Hypernatremia, resolved Secondary to free water deficit, resolved with increasing free water via PEG tube  ESBL Klebsiella aspiration pneumonia Enterococcus UTI Completed antibiotic course  NSTEMI, resolved Cardiology recommended medical management only.  Continue atorvastatin Plavix, Lopressor  Left temporal frontal punctate CVA Evaluated by neurology.  No work-up TTE was unremarkable for vegetation or clot.  We will continue Plavix and atorvastatin  Type 2 diabetes, controlled with renal complication. A1c 5.9 (12/2017, down from 9 on 10/2017) fasting glucose consistently in 140s-150s  Currently on detemir 12 units daily while on tube feeds, continue to closely monitor CBGs.  Tardive dyskinesia Diagnosed this hospitalization (01/18/2018) presumed related to reglan.  Cogentin Has been discontinued( on 02/09/18)  DVT prophylaxis: Consultants: Neurology, cardiology, surgery, critical care/home, IR, gastroenterology    Code Status: Full code  Family Communication:   On 1/14 (evening) discussed with Dr. Erick Alley (patient's son) patient's overall poor prognosis as deemed by our consultants here and agree with by [facility] consultants and discussion of her case.  He like to speak with both cardiology here (specifically Dr. Haroldine Laws, advanced heart failure) and nephrology at [facility] very poor prognosis, very limited medically and ways to treat, with course unable to accept as transfer due to no capacity, which was nephrologist does not think patient is dialysis candidate, Dr. Emeterio Reeve recommends potentially setting up a meeting with patient and ethics committee, kidney function is very poor with elevated BUN and GFR of 10 and essentially anuric.        Objective: Vitals:   02/14/18 2306 02/15/18 0134  02/15/18 0732 02/15/18 1624  BP: 116/79  129/83 (!) 142/79  Pulse: 66  76 72  Resp: (!) 22  18   Temp: 97.9 F (36.6 C)  (!) 97.4 F (36.3 C) 97.6 F (36.4 C)  TempSrc: Oral   Oral  SpO2: 99% 100% 98% 100%  Weight:      Height:        Intake/Output Summary (Last 24 hours) at 02/15/2018 1858 Last data filed at 02/15/2018 1800 Gross per 24 hour  Intake 1819.33 ml  Output 1090 ml  Net 729.33 ml   Filed Weights   02/09/18 0500 02/12/18 0500 02/14/18 0550  Weight: 71.3 kg 73.5 kg 75.2 kg    Exam:  Constitutional: Elderly female, chronically ill-appearing, no distress Eyes: EOMI, anicteric Cardiovascular: RRR no MRGs, 2+ pitting edema of bilateral lower extremities ( ankle to thigh) Respiratory: Normal respiratory effort on room air,  difficult to auscultate given positioning in bed Abdomen: Soft,non-tender, umbilical hernia present with non-mobile ,large hardened lymph node nontender with deep palpation, PEG tube in place with no surrounding erythema Skin: No rash ulcers, or lesions. Without skin tenting  Neurologic: Grunting, answering questions with yes and no with son at bedside, answering arithmetic equations (2+2= 4, 8+8 =16) at bedside (son asking questions), occasionally moving left arm sporadically, does not follow commands Data Reviewed: CBC: Recent Labs  Lab 02/11/18 0407 02/12/18 0616 02/13/18 0500 02/14/18 0515 02/15/18 0451  WBC 6.6 7.2 8.3 7.0 7.9  NEUTROABS  --   --   --   --  6.1  HGB 7.7* 7.1* 7.3* 7.2* 7.1*  HCT 24.2* 23.2* 23.1* 23.4* 23.5*  MCV 97.2 99.1 96.7 95.1 96.7  PLT 207 212 217 216 213   Basic  Metabolic Panel: Recent Labs  Lab 02/11/18 0407 02/12/18 0616 02/13/18 0500 02/14/18 0515 02/15/18 0451  NA 133* 133* 134* 133* 131*  K 5.5* 5.5* 5.6* 5.7* 5.7*  CL 96* 97* 97* 97* 95*  CO2 26 25 24 25 26   GLUCOSE 69* 122* 147* 129* 113*  BUN 132* 132* 135* 134* 139*  CREATININE 3.58* 3.74* 3.98* 4.07* 4.14*  CALCIUM 8.4* 8.2* 8.4* 8.5* 8.5*   MG  --   --   --   --  2.4   GFR: Estimated Creatinine Clearance: 9.1 mL/min (A) (by C-G formula based on SCr of 4.14 mg/dL (H)). Liver Function Tests: Recent Labs  Lab 02/15/18 0451  AST 55*  ALT 85*  ALKPHOS 78  BILITOT 0.7  PROT 6.2*  ALBUMIN 1.8*   No results for input(s): LIPASE, AMYLASE in the last 168 hours. No results for input(s): AMMONIA in the last 168 hours. Coagulation Profile: No results for input(s): INR, PROTIME in the last 168 hours. Cardiac Enzymes: No results for input(s): CKTOTAL, CKMB, CKMBINDEX, TROPONINI in the last 168 hours. BNP (last 3 results) No results for input(s): PROBNP in the last 8760 hours. HbA1C: No results for input(s): HGBA1C in the last 72 hours. CBG: Recent Labs  Lab 02/14/18 2306 02/15/18 0336 02/15/18 0732 02/15/18 1143 02/15/18 1619  GLUCAP 78 105* 107* 114* 129*   Lipid Profile: No results for input(s): CHOL, HDL, LDLCALC, TRIG, CHOLHDL, LDLDIRECT in the last 72 hours. Thyroid Function Tests: No results for input(s): TSH, T4TOTAL, FREET4, T3FREE, THYROIDAB in the last 72 hours. Anemia Panel: No results for input(s): VITAMINB12, FOLATE, FERRITIN, TIBC, IRON, RETICCTPCT in the last 72 hours. Urine analysis:    Component Value Date/Time   COLORURINE YELLOW 02/14/2018 1238   APPEARANCEUR TURBID (A) 02/14/2018 1238   LABSPEC 1.014 02/14/2018 1238   PHURINE 7.0 02/14/2018 1238   GLUCOSEU NEGATIVE 02/14/2018 1238   HGBUR SMALL (A) 02/14/2018 1238   BILIRUBINUR NEGATIVE 02/14/2018 1238   KETONESUR NEGATIVE 02/14/2018 1238   PROTEINUR 100 (A) 02/14/2018 1238   UROBILINOGEN 0.2 06/30/2011 1214   NITRITE NEGATIVE 02/14/2018 1238   LEUKOCYTESUR LARGE (A) 02/14/2018 1238   Sepsis Labs: @LABRCNTIP (procalcitonin:4,lacticidven:4)  )No results found for this or any previous visit (from the past 240 hour(s)).    Studies: No results found.  Scheduled Meds: . atorvastatin  40 mg Per Tube q1800  . chlorhexidine  15 mL Mouth  Rinse BID  . cholecalciferol  400 Units Per Tube Daily  . clopidogrel  75 mg Per Tube Daily  . collagenase   Topical Daily  . famotidine  10 mg Oral Daily  . feeding supplement (NEPRO CARB STEADY)  1,000 mL Per Tube Q24H  . ferrous sulfate  300 mg Per Tube Daily  . free water  300 mL Per Tube Q8H  . insulin aspart  0-15 Units Subcutaneous Q4H  . insulin detemir  12 Units Subcutaneous Daily  . isosorbide-hydrALAZINE  0.5 tablet Per Tube TID  . mouth rinse  15 mL Mouth Rinse q12n4p  . metoprolol tartrate  25 mg Per Tube BID  . multivitamin  15 mL Per Tube Daily  . patiromer  8.4 g Oral Daily    Continuous Infusions: . sodium chloride Stopped (01/08/18 2158)  . sodium chloride 30 mL/hr at 02/15/18 1311     LOS: 68 days     Author:  Berle Mull, MD [facility] 02/15/2018  If 7PM-7AM, please contact night-coverage To reach On-call, see [email or web address]   If  7PM-7AM, please contact night-coverage [email or web address] Password [facility] 02/15/2018, 6:58 PM

## 2018-02-16 ENCOUNTER — Ambulatory Visit (HOSPITAL_COMMUNITY): Payer: Medicare Other

## 2018-02-16 DIAGNOSIS — I361 Nonrheumatic tricuspid (valve) insufficiency: Secondary | ICD-10-CM

## 2018-02-16 LAB — RENAL FUNCTION PANEL
Albumin: 1.9 g/dL — ABNORMAL LOW (ref 3.5–5.0)
Anion gap: 12 (ref 5–15)
BUN: 137 mg/dL — ABNORMAL HIGH (ref 8–23)
CO2: 24 mmol/L (ref 22–32)
Calcium: 8.4 mg/dL — ABNORMAL LOW (ref 8.9–10.3)
Chloride: 96 mmol/L — ABNORMAL LOW (ref 98–111)
Creatinine, Ser: 4.09 mg/dL — ABNORMAL HIGH (ref 0.44–1.00)
GFR calc Af Amer: 11 mL/min — ABNORMAL LOW (ref >60)
GFR calc non Af Amer: 9 mL/min — ABNORMAL LOW (ref >60)
Glucose, Bld: 109 mg/dL — ABNORMAL HIGH (ref 70–99)
Phosphorus: 5.9 mg/dL — ABNORMAL HIGH (ref 2.5–4.6)
Potassium: 5.5 mmol/L — ABNORMAL HIGH (ref 3.5–5.1)
Sodium: 132 mmol/L — ABNORMAL LOW (ref 135–145)

## 2018-02-16 LAB — GLUCOSE, CAPILLARY
Glucose-Capillary: 103 mg/dL — ABNORMAL HIGH (ref 70–99)
Glucose-Capillary: 128 mg/dL — ABNORMAL HIGH (ref 70–99)
Glucose-Capillary: 142 mg/dL — ABNORMAL HIGH (ref 70–99)
Glucose-Capillary: 71 mg/dL (ref 70–99)
Glucose-Capillary: 61 mg/dL — ABNORMAL LOW (ref 70–99)
Glucose-Capillary: 93 mg/dL (ref 70–99)

## 2018-02-16 LAB — CBC
HCT: 24.5 % — ABNORMAL LOW (ref 36.0–46.0)
Hemoglobin: 7.5 g/dL — ABNORMAL LOW (ref 12.0–15.0)
MCH: 29.8 pg (ref 26.0–34.0)
MCHC: 30.6 g/dL (ref 30.0–36.0)
MCV: 97.2 fL (ref 80.0–100.0)
Platelets: 253 10*3/uL (ref 150–400)
RBC: 2.52 MIL/uL — ABNORMAL LOW (ref 3.87–5.11)
RDW: 16.4 % — ABNORMAL HIGH (ref 11.5–15.5)
WBC: 8.6 10*3/uL (ref 4.0–10.5)
nRBC: 0 % (ref 0.0–0.2)

## 2018-02-16 LAB — ECHOCARDIOGRAM LIMITED
Height: 63 in
Weight: 2754.87 oz

## 2018-02-16 MED ORDER — PERFLUTREN LIPID MICROSPHERE
INTRAVENOUS | Status: AC
  Filled 2018-02-16: qty 10

## 2018-02-16 NOTE — Consult Note (Signed)
Reason for Consult:Counsel her son Referring Physician: Dr. Agapito Games [name] is an 85 y.o. female.  HPI: 85 yr female who has been in hospital most of time sinc 10/19.  Initially acholasia, then respiratory failure related to vol xs, aspiration.  Also has has NSTEMI, CVA.  Had baseline CKD3 , and has experienced multiple insults with her other medical events..  Now Cr in low 4s, BUN over 100, and little urine.     Past Medical History:  Diagnosis Date  . Acute renal injury ([place]) 07/2016  . Anginal pain ([place]) 2004; 2011  . CAD (coronary artery disease)    status post stenting of the RCA status post CABG. (left internal mammary artery to  LAD, saphenous vein graft to second diagonal, saphenous vein  graft to obtuse marginal 1, saphenous vein graft to posterior  descending).  . Cardiomyopathy     Mildly reduced EF (45%).   . Chronic kidney disease (CKD), stage III (moderate) ([place])   . History of blood transfusion 1960s   "related to anemia"  . HTN (hypertension)   . Pleural effusion 2011   S/P "bypass"  . Shortness of breath   . Stroke ([place]) 09/19/2016   "left sided weakness" (09/20/2016)  . Type II diabetes mellitus ([place])     Past Surgical History:  Procedure Laterality Date  . BOTOX INJECTION  11/20/2017   Procedure: BOTOX INJECTION;  Surgeon: Doran Stabler, MD;  Location: [facility];  Service: Gastroenterology;;  . CATARACT EXTRACTION W/ INTRAOCULAR LENS  IMPLANT, BILATERAL Bilateral   . CORONARY ANGIOPLASTY WITH STENT PLACEMENT  2004   "LAD & one of the circumflex"  . CORONARY ARTERY BYPASS GRAFT  2011   CABG X4  . ESOPHAGOGASTRODUODENOSCOPY (EGD) WITH PROPOFOL N/A 11/20/2017   Procedure: ESOPHAGOGASTRODUODENOSCOPY (EGD) WITH PROPOFOL;  Surgeon: Doran Stabler, MD;  Location: [facility];  Service: Gastroenterology;  Laterality: N/A;  . IR GASTROSTOMY TUBE MOD SED  12/10/2017    Family History  Problem Relation Age of Onset  . Diabetes Father 8  .  Unexplained death Mother 61  . CAD Neg Hx     Social History:  reports that she has never smoked. She has never used smokeless tobacco. She reports that she does not drink alcohol or use drugs.  Allergies:  Allergies  Allergen Reactions  . Contrast Media [Iodinated Diagnostic Agents]     Sensitivity, kidney issues   Medications: I have reviewed the patient's current medications.   Results for orders placed or performed during the hospital encounter of 12/05/17 (from the past 48 hour(s))  Glucose, capillary     Status: Abnormal   Collection Time: 02/14/18  4:53 PM  Result Value Ref Range   Glucose-Capillary 111 (H) 70 - 99 mg/dL  Glucose, capillary     Status: Abnormal   Collection Time: 02/14/18  7:17 PM  Result Value Ref Range   Glucose-Capillary 106 (H) 70 - 99 mg/dL  Glucose, capillary     Status: None   Collection Time: 02/14/18 11:06 PM  Result Value Ref Range   Glucose-Capillary 78 70 - 99 mg/dL  Glucose, capillary     Status: Abnormal   Collection Time: 02/15/18  3:36 AM  Result Value Ref Range   Glucose-Capillary 105 (H) 70 - 99 mg/dL  CBC with Differential/Platelet     Status: Abnormal   Collection Time: 02/15/18  4:51 AM  Result Value Ref Range   WBC 7.9 4.0 - 10.5 K/uL  RBC 2.43 (L) 3.87 - 5.11 MIL/uL   Hemoglobin 7.1 (L) 12.0 - 15.0 g/dL   HCT 23.5 (L) 36.0 - 46.0 %   MCV 96.7 80.0 - 100.0 fL   MCH 29.2 26.0 - 34.0 pg   MCHC 30.2 30.0 - 36.0 g/dL   RDW 16.4 (H) 11.5 - 15.5 %   Platelets 213 150 - 400 K/uL   nRBC 0.0 0.0 - 0.2 %   Neutrophils Relative % 77 %   Neutro Abs 6.1 1.7 - 7.7 K/uL   Lymphocytes Relative 11 %   Lymphs Abs 0.9 0.7 - 4.0 K/uL   Monocytes Relative 10 %   Monocytes Absolute 0.8 0.1 - 1.0 K/uL   Eosinophils Relative 2 %   Eosinophils Absolute 0.1 0.0 - 0.5 K/uL   Basophils Relative 0 %   Basophils Absolute 0.0 0.0 - 0.1 K/uL   Immature Granulocytes 0 %   Abs Immature Granulocytes 0.03 0.00 - 0.07 K/uL    Comment: Performed at  [facility] [address], [place], [place] [address]  Comprehensive metabolic panel     Status: Abnormal   Collection Time: 02/15/18  4:51 AM  Result Value Ref Range   Sodium 131 (L) 135 - 145 mmol/L   Potassium 5.7 (H) 3.5 - 5.1 mmol/L   Chloride 95 (L) 98 - 111 mmol/L   CO2 26 22 - 32 mmol/L   Glucose, Bld 113 (H) 70 - 99 mg/dL   BUN 139 (H) 8 - 23 mg/dL   Creatinine, Ser 4.14 (H) 0.44 - 1.00 mg/dL   Calcium 8.5 (L) 8.9 - 10.3 mg/dL   Total Protein 6.2 (L) 6.5 - 8.1 g/dL   Albumin 1.8 (L) 3.5 - 5.0 g/dL   AST 55 (H) 15 - 41 U/L   ALT 85 (H) 0 - 44 U/L   Alkaline Phosphatase 78 38 - 126 U/L   Total Bilirubin 0.7 0.3 - 1.2 mg/dL   GFR calc non Af Amer 9 (L) >60 mL/min   GFR calc Af Amer 10 (L) >60 mL/min   Anion gap 10 5 - 15    Comment: Performed at [facility], [place] [address], [place], [place] [address]  Magnesium     Status: None   Collection Time: 02/15/18  4:51 AM  Result Value Ref Range   Magnesium 2.4 1.7 - 2.4 mg/dL    Comment: Performed at [facility] [address], [place], [place] [address]  Glucose, capillary     Status: Abnormal   Collection Time: 02/15/18  7:32 AM  Result Value Ref Range   Glucose-Capillary 107 (H) 70 - 99 mg/dL  Glucose, capillary     Status: Abnormal   Collection Time: 02/15/18 11:43 AM  Result Value Ref Range   Glucose-Capillary 114 (H) 70 - 99 mg/dL  Glucose, capillary     Status: Abnormal   Collection Time: 02/15/18  4:19 PM  Result Value Ref Range   Glucose-Capillary 129 (H) 70 - 99 mg/dL  Glucose, capillary     Status: Abnormal   Collection Time: 02/15/18  7:17 PM  Result Value Ref Range   Glucose-Capillary 132 (H) 70 - 99 mg/dL  Glucose, capillary     Status: Abnormal   Collection Time: 02/15/18 11:53 PM  Result Value Ref Range   Glucose-Capillary 114 (H) 70 - 99 mg/dL  Glucose, capillary     Status: Abnormal   Collection Time: 02/16/18  4:23 AM  Result Value  Ref Range   Glucose-Capillary 103 (H) 70 -  99 mg/dL  Renal function panel     Status: Abnormal   Collection Time: 02/16/18  5:00 AM  Result Value Ref Range   Sodium 132 (L) 135 - 145 mmol/L   Potassium 5.5 (H) 3.5 - 5.1 mmol/L   Chloride 96 (L) 98 - 111 mmol/L   CO2 24 22 - 32 mmol/L   Glucose, Bld 109 (H) 70 - 99 mg/dL   BUN 137 (H) 8 - 23 mg/dL   Creatinine, Ser 4.09 (H) 0.44 - 1.00 mg/dL   Calcium 8.4 (L) 8.9 - 10.3 mg/dL   Phosphorus 5.9 (H) 2.5 - 4.6 mg/dL   Albumin 1.9 (L) 3.5 - 5.0 g/dL   GFR calc non Af Amer 9 (L) >60 mL/min   GFR calc Af Amer 11 (L) >60 mL/min   Anion gap 12 5 - 15    Comment: Performed at [facility], [address] [place], [place] [address]  Glucose, capillary     Status: Abnormal   Collection Time: 02/16/18  9:55 AM  Result Value Ref Range   Glucose-Capillary 128 (H) 70 - 99 mg/dL  CBC     Status: Abnormal   Collection Time: 02/16/18  9:59 AM  Result Value Ref Range   WBC 8.6 4.0 - 10.5 K/uL   RBC 2.52 (L) 3.87 - 5.11 MIL/uL   Hemoglobin 7.5 (L) 12.0 - 15.0 g/dL   HCT 24.5 (L) 36.0 - 46.0 %   MCV 97.2 80.0 - 100.0 fL   MCH 29.8 26.0 - 34.0 pg   MCHC 30.6 30.0 - 36.0 g/dL   RDW 16.4 (H) 11.5 - 15.5 %   Platelets 253 150 - 400 K/uL   nRBC 0.0 0.0 - 0.2 %    Comment: Performed at [facility], [place]. [address] [place], [place] [address]  Glucose, capillary     Status: Abnormal   Collection Time: 02/16/18 11:41 AM  Result Value Ref Range   Glucose-Capillary 142 (H) 70 - 99 mg/dL    No results found.  ROS Blood pressure 119/70, pulse 72, temperature 97.7 F (36.5 C), temperature source Oral, resp. rate 18, height 5\' 3"  (1.6 m), weight 78.1 kg, SpO2 100 %. Physical Exam Physical Examination: General appearance - yelling out, , uncomfortable, Chest - rales noted bibasilar, decreased air entry noted bilat Heart - S1 and S2 normal, systolic murmur DJ4/9 at 2nd left intercostal space Abdomen - PEG, mild distension Extremities - pedal edema 2-3+.presacral +, Boots on  feet  Assessment/Plan: 1 CKD 5 This  Has been multiple acute insults on top of CKD and liklihood of recovery very small. Has vol xs. ? Uremia (not convinced a lot of AMS is uremia, suspect dementia that was present before).  In the setting of multiple comorbidities ie CVAs(multiple lesions on MRI, also atrophy ?vasc), CAD with reduced EF, confusion, DM, severe debillitation, malnutrition, achalasia) in addition to other medical issues (resp, anemia), offering dialysis is not of her best interest and would be problematic in whatever form.  We will not offer her Renal Replacement Therapy (as has been also the opinion of other Nephrologists) If other opinions desired, rec transfer to another facility. 1h spent with this.         Aum Caggiano 02/16/2018, 1:40 PM

## 2018-02-16 NOTE — Progress Notes (Signed)
  Speech Language Pathology Treatment: Dysphagia  Patient Details Name: Cinzia Devos MRN: [id number] DOB: 04-07-1927 Today's Date: 02/16/2018 Time: 0093-8182 SLP Time Calculation (min) (ACUTE ONLY): 30 min  Assessment / Plan / Recommendation Clinical Impression  SLP follow up at bedside to assess pt readiness for po intake. Pt was awake, alert, frequently crying out. No family present at this time.   Oral care was attempted, however, pt refused to allow SLP to do anything other than wipe the surface of her lips with moistened swab. SLP provided one ice chip, which pt immediately spat out. Attempts at water and puree were refused as well. Pt indicated "no", covered her mouth, and turned her head away.   Reviewing ST notes from recent sessions, it appears pt has not made significant consistent improvement toward appropriateness for po intake.  ST will sign off at this time, as skilled services are no longer warranted. At such time as pt begins requesting food or liquid, or becomes more accepting of oral care and trials of ice chips, SLP will be available to reassess appropriateness. RN informed and in agreement.  Recommend thorough oral care QID, with trials of ice chips ONLY AFTER ORAL CARE, when pt is alert and upright.   HPI HPI: 85 y.o. female with CAD s/p CABG, recent ischemic stroke, HTN, DM, HLP, recent failure to thrive due to swallowing problems s/p botox injection for achalasia, developed acute pulmonary edema and NSTEMI on 12/20/2017. Bedside swallow on 11/05/17 revealed functional oropharyngeal swallow and suspected esophageal dysphagia. Had esophagram 11/06/17 which showed moderately large hiatal hernia with poor motility and retained barium throughout the esophagus. PEG 12/10/17.  Repeat bedside swallow eval 12/23/17 with recs for NPO secondary to poor MS, no oral anticipation/recognition of POs. Pt with no change in MS that would allow safe oral intake between 11/23 and date of  intubation,12/2.  Extubated 12/6. New orders for repeat bedside swallow eval s/p extubation.      SLP Plan  DC ST - goals not met. Pt alert, but refusing all po trials, ice chips, and oral care.       Recommendations  Diet recommendations: NPO for primary nutrition and hydration. May provide ice chips 1 @ a time following oral care  Medication Administration: Via alternative means                Oral Care Recommendations: Oral care QID Follow up Recommendations: Skilled Nursing facility;24 hour supervision/assistance SLP Visit Diagnosis: Dysphagia, unspecified (R13.10) Plan: Continue with current plan of care       Perdido. Quentin Ore [facility], CCC-SLP Speech Language Pathologist [phone]  Shonna Chock 02/16/2018, 10:57 AM

## 2018-02-16 NOTE — Progress Notes (Signed)
PROGRESS NOTE  Kemara Quigley GGY:[id number] DOB: 07/29/1927 DOA: 12/05/2017 PCP: Deland Pretty, MD  HPI/Brief Narrative  [name] is a 85 y.o. year old female with medical history significant for CAD s/p CABG, recent ischemic stroke, HTN, DM, HLD, stage 3 CKD recent admission from 10/5-10/25 for failure to thrive with profound weight loss due to swallowing problems s/p botox injection for achalasia, who presented on  12/05/2017 for admission from her SNF due to difficulty speaking which progressed tp aphasia.  Patient has had prolonged hospital course significant for peg tube placement by IR (12/11/2018) due to dysphagia from achalasia, NSTEMI, respiratory failure requiring intubation (12/2-12/6, extubated), Enterococcus UTI/Klebsiella pneumonia, anemia and now worsening renal failure. Palliative has been involved intermittently throughout hospital stay, but her son wants FULL code as he feels strongly she will not receive adequate care if she is made DNR/DNI. Currently active issue is acute kidney injury on chronic kidney disease stage III with progressive uremia and hyperkalemia. This appears to be multifactorial although current primary issue is cardiorenal hemodynamics per nephrology.  Nephrology will with the patient and multiple nephrologist has felt that the patient is not a candidate for hemodialysis. As per family request we have tried transfer to tertiary care center ([facility]) but they are at capacity and is unable to accept the patient.  the case was reviewed with the on-call nephrologist at [facility] nephrologist in a consultation capacity on 02/13/2018 who agreed with our nephrologist's assessment that the patient is not a dialysis candidate given her multiple comorbidities and age she would not tolerate this procedure and advised continue goals of care discussions.  Hospital events 11/10 PEG placed poor oral intake related 11/19 General surgery consulted, found to have  nonincarcerated umbilical hernia no surgical needs and lymph node within hernia. 11/21 developed respiratory failure due to to significant volume overload from CHF exacerbation, treated aggressively with IV diuretics, NSTEMI medically managed, EF found to be 30% at that time 11/27 ENT consulted for stridor, flexible endoscopy negative 12/2 intubated for persistent pulmonary edema/hypervolemic status from heart failure flare (critical care was consulted for agonal respirations prompting transfer to ICU), with extensive IV diuresis, extubated 12/6 12/17 persistent AKI/worsening of GFR 12/18 transfer to progressive unit 1/7, nephrology deems not a dialysis candidate given age and multiple comorbidities/Poor functional status, progressive worsening kidney dysfunction, not improving with any medical management for CHF flare 1/12 advanced heart failure does not recommend any further medical management for CHF with reduced EF, and that encourages goals of care discussion 1/13 patient son like to transfer to tertiary care center for initiation of dialysis,  1/15 I personally discussed with [facility] as well as [facility] both of which currently are at full capacity and unable to take the patient.  Patient is not placed on any wait list on both facilities.  Changing Jevity to Nepro and increasing free water. 1/17 nephrology reconsulted for final recommendation.  Advanced heart failure team was also reconsulted.  Subjective Somewhat awake.  Not following commands.  No nausea no vomiting no fever no chills.  Assessment/Plan:  AKI on CKD stage III (multifactorial etiology),  Seems to be progressing to end-stage renal disease Suspect related to fluid shifts/initial pre-renal etiology  from recent infections and poor oral intake with hypotension (initially treated with IVF), CHF exacerbations, aspiration pneumonia CVA,.   Given persistent vascular congestion, anasarca/hypervolemia  suspect the predominant force is cardiorenal syndrome.  Fena 0.6, prerenal versus cardiorenal although the patient's weight  is going up as well as patient has third spacing in her leg therefore consistent with cardiorenal right now. maintaining normal saturation. Appreciate nephrology assistance.  Patient is felt not a candidate for HD or PD due to her multiple comorbidities. Daily engagement with patient's son Dr. Mayer Masker. On Nepro.  Goals of care Patient has very poor prognosis.   No progress in goals of care discussions Ethics committee consulted on 1/11 and Dr. Emeterio Reeve provided advice.   Son believes he is acting in mother's wishes as she has 2 brothers in the late 60s on dialysis and doing well.   Dr. Sheppard Coil recommends touching base with the daughter to ensure she knows she is also a valid decision-maker along side her brother. In the past, son has expressed that his mother should be given all the chances possible and if it were not for her he would not be where he is right now. On 02/16/2018 had a prolonged discussion with son regarding my concern.  While the son appreciate that the patient's chances of surviving a cardiac arrest is less than 5% and even if she survives her neurological recovery after such a major event is next to nil, he would still like to maintain full code, despite knowing that the patient is deemed not a candidate for dialysis and multiple nephrologist and not a candidate for inotropes by advanced heart failure team as well as repeat echocardiogram performed on 02/16/2018 shows persistently low EF with moderate MR, now with grade 2 diastolic dysfunction as well as RV dysfunction.  He mentions, " during her CODE STATUS to DNR/DNI would mean she would not get any aggressive treatment"  Acute hypoxic respiratory failure  Acute on chronic combined diastolic and systolic CHF Moderate MR, moderate RV systolic dysfunction Moderate bilateral pleural effusion Moderate  anasarca She had multiple acute hypoxic respiratory events secondary to volume overload Likely related to hypoalbuminemia in addition to CHF flare, and now worsening b/l pleural effusion on imaging.   She has not responded well to medical management. hold off on any diuretic therapy given her poor kidney function, continue BiDil for afterload reduction, daily weights, strict I/os.   [name] has been informed multiple times by advanced heart failure team that patient is not a candidate for inotropic agents.  Acute metabolic encephalopathy, fluctuating between lethargy, agitation Still fluctuating ability to converse  Occasionally says hello/good morning, is correctly answering simple math equations that her son gives her at bedside,  but then will be lethargic and will not be responding the next day. Intermittently answers questions with yes or no, able to say she is nauseous and in pain intermittently otherwise non-sensical or grunts. multifactorial etiology hospital-acquired delirium, uremia related to AKI on CKD, oxygen requirements, suspected vascular dementia related to chronic infarcts in addition to CVA here. Prior to this admission in September 2019 patient was alert and oriented x4 during most of her ADLs and very well-functioning.  EEG unremarkable.  No longer on Seroquel. delirium precautions  Acute on chronic anemia chronic disease, most likely CKD Baseline hemoglobin prior to hospital stay 10-11.   Hemoglobin here persistently remain around 7 to 8. Closely monitor CBCs. Goal hemoglobin greater than 8, however concern patient will not tolerate volume for blood transfusion given reduced EF and significant pulmonary edema/pleural effusion with limited ability to diurese given kidney function.  Dysphagia related to achalasia with failed botox/moderate malnutrition/failure to thrive Continue PEG tube feeding,  would like to increase to appropriate rate to meet nutritional  needs  but son is against for fear that more protein will worsen her BUN.  Vaginal bleeding secondary to uterine fibroma Hemoglobin remained stable, monitor CBC   Hypernatremia, resolved Secondary to free water deficit, resolved with increasing free water via PEG tube  ESBL Klebsiella aspiration pneumonia Enterococcus UTI Completed antibiotic course  NSTEMI, resolved Cardiology recommended medical management only.   Continue atorvastatin Plavix, Lopressor  Total 3 punctate left temporal and frontal punctate CVA Evaluated by neurology.   No work-up TTE was unremarkable for vegetation or clot.   We will continue Plavix and atorvastatin  Type 2 diabetes, controlled with renal complication. A1c 5.9 (12/2017, down from 9 on 10/2017) fasting glucose consistently in control Currently on detemir 12 units daily while on tube feeds, continue to closely monitor CBGs.  Tardive dyskinesia Diagnosed this hospitalization (01/18/2018) presumed related to reglan.  Cogentin Has been discontinued( on 02/09/18)  Lymph node within umbilical hernia, increased in size Known [facility] lymph node on previous CT evaluations, imaging from 1/10 showed increased size.  Son does not want core biopsy as he is less concern for malignancy given no other evidence of lymphadenopathy on imaging.  Umbilical hernia without incarceration Surgery has evaluated twice most recently on 1/10.  Hardened mass on exam is consistent with lymph node as mentioned above  DVT prophylaxis: Consultants: Neurology, cardiology, surgery, critical care/home, IR, gastroenterology  Code Status: Full code  Ethics. Patient with multiple comorbidities as mentioned above, not a candidate for any dialysis treatment per nephrology or any other aggressive intervention from cardiology.  Son wants to continue full code, wants to pursue options for hemodialysis, requesting surgical procedure-PD catheter placement which would be deemed high risk in  current set up. (Estimated Risk Probability for Perioperative Myocardial Infarction or Cardiac Arrest 15.56 % Lyndel Safe PK et.al).  I recommended family to consider her changing CODE STATUS to DNR which is currently full code.  Son understand that the patient's chances of survival of a cardiac arrest is less than 5% and meaningful recovery is negligible and still would like to maintain full code.  Sons rationality is that she may not get aggressive care if she is DNR/DNI and says that "in his business 5% is big."  Patient is at very high risk for major event primary due to her volume overload and family wants to continue IV fluids and adamant about it.  Son has been informed that 2 tertiary care facilities have felt that the patient's transfer is not medically necessary. Ethics committee is currently involved.  We will discuss appropriate timing for arranging a family meeting with ethics committee.  Family Communication:   Discussed with son at bedside on a daily basis.  Disposition Plan very poor prognosis, very limited medically and ways to treat, with course unable to accept as transfer due to no capacity, which was nephrologist does not think patient is dialysis candidate, Dr. Emeterio Reeve recommends potentially setting up a meeting with patient and ethics committee, kidney function is very poor with elevated BUN and GFR of 10 and essentially anuric  Objective: Vitals:   02/15/18 2346 02/16/18 0500 02/16/18 0739 02/16/18 1704  BP: 126/60  119/70 137/67  Pulse: 68  72 82  Resp: 20  18 20   Temp: 97.7 F (36.5 C)  97.7 F (36.5 C) (!) 97.4 F (36.3 C)  TempSrc:   Oral Oral  SpO2: 100%  100%   Weight:  78.1 kg    Height:  Intake/Output Summary (Last 24 hours) at 02/16/2018 1721 Last data filed at 02/16/2018 1600 Gross per 24 hour  Intake 1884.65 ml  Output 650 ml  Net 1234.65 ml   Filed Weights   02/12/18 0500 02/14/18 0550 02/16/18 0500  Weight: 73.5 kg 75.2 kg 78.1 kg     Exam:  Constitutional: Elderly female, chronically ill-appearing, mild distress Eyes: EOMI, anicteric Cardiovascular: RRR no murmur, 2+ pitting edema of bilateral lower and upper extremities ( ankle to thigh) Respiratory: Normal respiratory effort on room air,  difficult to auscultate given positioning in bed Abdomen: Soft,non-tender, distended, umbilical hernia present with non-mobile ,large hardened lymph node nontender with deep palpation, PEG tube in place with no surrounding erythema Skin: No rash ulcers, or lesions. Without skin tenting  Neurologic: Grunting, answering questions with yes and no does not follow commands Data Reviewed: CBC: Recent Labs  Lab 02/12/18 0616 02/13/18 0500 02/14/18 0515 02/15/18 0451 02/16/18 0959  WBC 7.2 8.3 7.0 7.9 8.6  NEUTROABS  --   --   --  6.1  --   HGB 7.1* 7.3* 7.2* 7.1* 7.5*  HCT 23.2* 23.1* 23.4* 23.5* 24.5*  MCV 99.1 96.7 95.1 96.7 97.2  PLT 212 217 216 213 725   Basic Metabolic Panel: Recent Labs  Lab 02/12/18 0616 02/13/18 0500 02/14/18 0515 02/15/18 0451 02/16/18 0500  NA 133* 134* 133* 131* 132*  K 5.5* 5.6* 5.7* 5.7* 5.5*  CL 97* 97* 97* 95* 96*  CO2 25 24 25 26 24   GLUCOSE 122* 147* 129* 113* 109*  BUN 132* 135* 134* 139* 137*  CREATININE 3.74* 3.98* 4.07* 4.14* 4.09*  CALCIUM 8.2* 8.4* 8.5* 8.5* 8.4*  MG  --   --   --  2.4  --   PHOS  --   --   --   --  5.9*   GFR: Estimated Creatinine Clearance: 9.4 mL/min (A) (by C-G formula based on SCr of 4.09 mg/dL (H)). Liver Function Tests: Recent Labs  Lab 02/15/18 0451 02/16/18 0500  AST 55*  --   ALT 85*  --   ALKPHOS 78  --   BILITOT 0.7  --   PROT 6.2*  --   ALBUMIN 1.8* 1.9*   No results for input(s): LIPASE, AMYLASE in the last 168 hours. No results for input(s): AMMONIA in the last 168 hours. Coagulation Profile: No results for input(s): INR, PROTIME in the last 168 hours. Cardiac Enzymes: No results for input(s): CKTOTAL, CKMB, CKMBINDEX,  TROPONINI in the last 168 hours. BNP (last 3 results) No results for input(s): PROBNP in the last 8760 hours. HbA1C: No results for input(s): HGBA1C in the last 72 hours. CBG: Recent Labs  Lab 02/15/18 2353 02/16/18 0423 02/16/18 0955 02/16/18 1141 02/16/18 1701  GLUCAP 114* 103* 128* 142* 71   Lipid Profile: No results for input(s): CHOL, HDL, LDLCALC, TRIG, CHOLHDL, LDLDIRECT in the last 72 hours. Thyroid Function Tests: No results for input(s): TSH, T4TOTAL, FREET4, T3FREE, THYROIDAB in the last 72 hours. Anemia Panel: No results for input(s): VITAMINB12, FOLATE, FERRITIN, TIBC, IRON, RETICCTPCT in the last 72 hours. Urine analysis:    Component Value Date/Time   COLORURINE YELLOW 02/14/2018 1238   APPEARANCEUR TURBID (A) 02/14/2018 1238   LABSPEC 1.014 02/14/2018 1238   PHURINE 7.0 02/14/2018 1238   GLUCOSEU NEGATIVE 02/14/2018 1238   HGBUR SMALL (A) 02/14/2018 1238   BILIRUBINUR NEGATIVE 02/14/2018 [place] 02/14/2018 1238   PROTEINUR 100 (A) 02/14/2018 1238   UROBILINOGEN  0.2 06/30/2011 1214   NITRITE NEGATIVE 02/14/2018 1238   LEUKOCYTESUR LARGE (A) 02/14/2018 1238   Sepsis Labs: @LABRCNTIP (procalcitonin:4,lacticidven:4)  )No results found for this or any previous visit (from the past 240 hour(s)).    Studies: No results found.  Scheduled Meds: . atorvastatin  40 mg Per Tube q1800  . chlorhexidine  15 mL Mouth Rinse BID  . cholecalciferol  400 Units Per Tube Daily  . clopidogrel  75 mg Per Tube Daily  . collagenase   Topical Daily  . famotidine  10 mg Oral Daily  . feeding supplement (NEPRO CARB STEADY)  1,000 mL Per Tube Q24H  . ferrous sulfate  300 mg Per Tube Daily  . free water  300 mL Per Tube Q8H  . insulin aspart  0-15 Units Subcutaneous Q4H  . insulin detemir  12 Units Subcutaneous Daily  . isosorbide-hydrALAZINE  0.5 tablet Per Tube TID  . mouth rinse  15 mL Mouth Rinse q12n4p  . metoprolol tartrate  25 mg Per Tube BID  .  multivitamin  15 mL Per Tube Daily  . patiromer  8.4 g Oral Daily    Continuous Infusions: . sodium chloride Stopped (01/08/18 2158)  . sodium chloride 30 mL/hr at 02/16/18 1600     LOS: 69 days     Author:  Berle Mull, MD [facility] 02/16/2018  If 7PM-7AM, please contact night-coverage To reach On-call, see [email or web address]   If 7PM-7AM, please contact night-coverage [email or web address] Password [facility] 02/16/2018, 5:21 PM

## 2018-02-16 NOTE — Progress Notes (Signed)
  Echocardiogram 2D Echocardiogram has been performed.  [name] 02/16/2018, 11:30 AM

## 2018-02-17 ENCOUNTER — Inpatient Hospital Stay (HOSPITAL_COMMUNITY): Payer: Medicare Other

## 2018-02-17 DIAGNOSIS — R609 Edema, unspecified: Secondary | ICD-10-CM

## 2018-02-17 LAB — RENAL FUNCTION PANEL
Albumin: 1.9 g/dL — ABNORMAL LOW (ref 3.5–5.0)
Anion gap: 14 (ref 5–15)
BUN: 135 mg/dL — ABNORMAL HIGH (ref 8–23)
CHLORIDE: 93 mmol/L — AB (ref 98–111)
CO2: 24 mmol/L (ref 22–32)
Calcium: 8.3 mg/dL — ABNORMAL LOW (ref 8.9–10.3)
Creatinine, Ser: 3.79 mg/dL — ABNORMAL HIGH (ref 0.44–1.00)
GFR calc Af Amer: 12 mL/min — ABNORMAL LOW (ref >60)
GFR calc non Af Amer: 10 mL/min — ABNORMAL LOW (ref >60)
Glucose, Bld: 129 mg/dL — ABNORMAL HIGH (ref 70–99)
Phosphorus: 5.1 mg/dL — ABNORMAL HIGH (ref 2.5–4.6)
Potassium: 5.2 mmol/L — ABNORMAL HIGH (ref 3.5–5.1)
Sodium: 131 mmol/L — ABNORMAL LOW (ref 135–145)

## 2018-02-17 LAB — CBC
HEMATOCRIT: 24 % — AB (ref 36.0–46.0)
Hemoglobin: 7.3 g/dL — ABNORMAL LOW (ref 12.0–15.0)
MCH: 29 pg (ref 26.0–34.0)
MCHC: 30.4 g/dL (ref 30.0–36.0)
MCV: 95.2 fL (ref 80.0–100.0)
Platelets: 229 10*3/uL (ref 150–400)
RBC: 2.52 MIL/uL — AB (ref 3.87–5.11)
RDW: 16.1 % — ABNORMAL HIGH (ref 11.5–15.5)
WBC: 8.1 10*3/uL (ref 4.0–10.5)
nRBC: 0 % (ref 0.0–0.2)

## 2018-02-17 LAB — GLUCOSE, CAPILLARY
Glucose-Capillary: 101 mg/dL — ABNORMAL HIGH (ref 70–99)
Glucose-Capillary: 116 mg/dL — ABNORMAL HIGH (ref 70–99)
Glucose-Capillary: 139 mg/dL — ABNORMAL HIGH (ref 70–99)
Glucose-Capillary: 154 mg/dL — ABNORMAL HIGH (ref 70–99)
Glucose-Capillary: 98 mg/dL (ref 70–99)

## 2018-02-17 NOTE — Progress Notes (Signed)
PROGRESS NOTE  [name] HYQ:[id number] DOB: [birth year] DOA: 12/05/2017 PCP: Deland Pretty, MD  HPI/Brief Narrative  [name] is a 85 y.o. year old female with medical history significant for CAD s/p CABG, recent ischemic stroke, HTN, DM, HLD, stage 3 CKD recent admission from 10/5-10/25 for failure to thrive with profound weight loss due to swallowing problems s/p botox injection for achalasia, who presented on  12/05/2017 for admission from her SNF due to difficulty speaking which progressed tp aphasia.  Patient has had prolonged hospital course significant for peg tube placement by IR (12/11/2018) due to dysphagia from achalasia, NSTEMI, respiratory failure requiring intubation (12/2-12/6, extubated), Enterococcus UTI/Klebsiella pneumonia, anemia and now worsening renal failure. Palliative has been involved intermittently throughout hospital stay, but her son wants FULL code as he feels strongly she will not receive adequate care if she is made DNR/DNI. Currently active issue is acute kidney injury on chronic kidney disease stage III with progressive uremia and hyperkalemia. This appears to be multifactorial although current primary issue is cardiorenal hemodynamics per nephrology.  Nephrology will with the patient and multiple nephrologist has felt that the patient is not a candidate for hemodialysis. As per family request we have tried transfer to tertiary care center ([place]) but they are at capacity and is unable to accept the patient.  the case was reviewed with the on-call nephrologist at [facility] nephrologist in a consultation capacity on 02/13/2018 who agreed with our nephrologist's assessment that the patient is not a dialysis candidate given her multiple comorbidities and age she would not tolerate this procedure and advised continue goals of care discussions.  Hospital events 11/10 PEG placed poor oral intake related 11/19 General surgery consulted, found to have  nonincarcerated umbilical hernia no surgical needs and lymph node within hernia. 11/21 developed respiratory failure due to to significant volume overload from CHF exacerbation, treated aggressively with IV diuretics, NSTEMI medically managed, EF found to be 30% at that time 11/27 ENT consulted for stridor, flexible endoscopy negative 12/2 intubated for persistent pulmonary edema/hypervolemic status from heart failure flare (critical care was consulted for agonal respirations prompting transfer to ICU), with extensive IV diuresis, extubated 12/6 12/17 persistent AKI/worsening of GFR 12/18 transfer to progressive unit 1/7, nephrology deems not a dialysis candidate given age and multiple comorbidities/Poor functional status, progressive worsening kidney dysfunction, not improving with any medical management for CHF flare 1/12 advanced heart failure does not recommend any further medical management for CHF with reduced EF, and that encourages goals of care discussion 1/13 patient son like to transfer to tertiary care center for initiation of dialysis,  1/15 I personally discussed with [facility] as well as [facility] both of which currently are at full capacity and unable to take the patient.  Patient is not placed on any wait list on both facilities.  Changing Jevity to Nepro and increasing free water. 1/17 nephrology reconsulted for final recommendation.  Advanced heart failure team was also reconsulted. 1/18 patient appears to be more volume overloaded, fluids stopped.  Continue with free water.  Lower extremity Doppler done, ruled out DVT.  Shows anasarca.  Subjective Awake, appears to be more agitated today.  Family reports that the patient did have some nausea earlier during the day.  No vomiting.  Last bowel movement yesterday.  Assessment/Plan:  AKI on CKD stage III (multifactorial etiology),  Seems to be progressing to end-stage renal disease Suspect related to fluid  shifts/initial pre-renal etiology  from recent  infections and poor oral intake with hypotension (initially treated with IVF), CHF exacerbations, aspiration pneumonia CVA,.   Given persistent vascular congestion, anasarca/hypervolemia suspect the predominant force is cardiorenal syndrome.  Fena 0.6, prerenal versus cardiorenal although the patient's weight is going up as well as patient has third spacing in her leg therefore consistent with cardiorenal right now. maintaining normal saturation. Appreciate nephrology assistance.  Patient is felt not a candidate for HD or PD due to her multiple comorbidities. Daily engagement with patient's son Dr. Mayer Masker. On Nepro. renal function is actually improving but patient appears to be more volume overloaded and therefore recommendation is to stop the fluid.  Goals of care Patient has very poor prognosis.   No progress in goals of care discussions Ethics committee consulted on 1/11 and currently considering family meeting. Son believes he is acting in mother's wishes as she has 2 brothers in the late 73s on dialysis and doing well.  Dr. Sheppard Coil recommends touching base with the daughter to ensure she knows she is also a valid decision-maker along side her brother. In the past, son has expressed that his mother should be given all the chances possible and if it were not for her he would not be where he is right now. On 02/16/2018 had a prolonged discussion with son regarding my concern regarding patient's CODE STATUS. While the son appreciate that the patient's chances of surviving a cardiac arrest is less than 5% and even if she survives her neurological recovery after such a major event is next to nil, he would still like to maintain full code, despite knowing that the patient is deemed not a candidate for dialysis and multiple nephrologist and not a candidate for inotropes by advanced heart failure team as well as repeat echocardiogram performed on 02/16/2018  shows persistently low EF with moderate MR, now with grade 2 diastolic dysfunction as well as RV dysfunction.  He mentions, " changing her CODE STATUS to DNR/DNI would mean she would not get any aggressive treatment"  Acute hypoxic respiratory failure  Acute on chronic combined diastolic and systolic CHF Moderate MR, moderate RV systolic dysfunction Moderate bilateral pleural effusion anasarca She had multiple acute hypoxic respiratory events secondary to volume overload Likely related to hypoalbuminemia in addition to CHF flare, and now worsening b/l pleural effusion on imaging.   She has not responded well to medical management. hold off on any diuretic therapy given her poor kidney function, continue BiDil for afterload reduction, daily weights, strict I/os.   [name] has been informed multiple times by advanced heart failure team that patient is not a candidate for inotropic agents.  Patient does have edema in all 4 extremities left leg appears to be more swollen than the right and therefore her extremity venous Doppler was ordered.  Which is negative.  Acute metabolic encephalopathy, fluctuating between lethargy, agitation Still fluctuating ability to converse  Occasionally says hello/good morning, is correctly answering simple math equations that her son gives her at bedside,  but then will be lethargic and will not be responding the next day. Intermittently answers questions with yes or no, able to say she is nauseous and in pain intermittently otherwise non-sensical or grunts. multifactorial etiology hospital-acquired delirium, uremia related to AKI on CKD, oxygen requirements, suspected vascular dementia related to chronic infarcts in addition to CVA here. Prior to this admission in September 2019 patient was alert and oriented x4 during most of her ADLs and very well-functioning.  EEG unremarkable.  No longer on  Seroquel. delirium precautions  Acute on chronic anemia chronic  disease, most likely CKD Baseline hemoglobin prior to hospital stay 10-11.   Hemoglobin here persistently remain around 7 to 8. Closely monitor CBCs. Goal hemoglobin greater than 8, however concern patient will not tolerate volume for blood transfusion given reduced EF and significant pulmonary edema/pleural effusion with limited ability to diurese given kidney function.  Dysphagia related to achalasia with failed botox/moderate malnutrition/failure to thrive Continue PEG tube feeding,   Vaginal bleeding secondary to uterine fibroma Hemoglobin remained stable, monitor CBC   Hypernatremia, resolved Secondary to free water deficit, resolved with increasing free water via PEG tube  ESBL Klebsiella aspiration pneumonia Enterococcus UTI Completed antibiotic course  NSTEMI, resolved Cardiology recommended medical management only.   Continue atorvastatin Plavix, Lopressor  Total 3 punctate left temporal and frontal punctate CVA Evaluated by neurology.   No work-up TTE was unremarkable for vegetation or clot.   We will continue Plavix and atorvastatin  Type 2 diabetes, controlled with renal complication. A1c 5.9 (12/2017, down from 9 on 10/2017) fasting glucose consistently in control Currently on detemir 12 units daily while on tube feeds, continue to closely monitor CBGs.  Tardive dyskinesia Diagnosed this hospitalization (01/18/2018) presumed related to reglan.  Cogentin Has been discontinued( on 02/09/18)  Lymph node within umbilical hernia, increased in size Known [facility] lymph node on previous CT evaluations, imaging from 1/10 showed increased size.  Son does not want core biopsy as he is less concern for malignancy given no other evidence of lymphadenopathy on imaging.  Umbilical hernia without incarceration Surgery has evaluated twice most recently on 1/10.  Hardened mass on exam is consistent with lymph node as mentioned above  DVT prophylaxis: Consultants: Neurology,  cardiology, surgery, critical care/home, IR, gastroenterology  Code Status: Full code  Ethics. Patient with multiple comorbidities as mentioned above, not a candidate for any dialysis treatment per nephrology or any other aggressive intervention from cardiology.  Son wants to continue full code, wants to pursue options for hemodialysis, requesting surgical procedure-PD catheter placement which would be deemed high risk in current set up. (Estimated Risk Probability for Perioperative Myocardial Infarction or Cardiac Arrest 15.56 % Lyndel Safe PK et.al).  Given his severely poor prognosis I recommended family to consider her changing CODE STATUS to DNR which is currently full code.  Son understand that the patient's chances of survival of a cardiac arrest is less than 5% and meaningful recovery is negligible and still would like to maintain full code.  Sons rationality is that she may not get aggressive care if she is DNR/DNI and says that "in his business 5% is big."  Patient is at very high risk for major event primary due to her volume overload. Son has been informed that 2 tertiary care facilities have felt that the patient's transfer is not medically necessary. Ethics committee is currently involved.  We will discuss appropriate timing for arranging a family meeting with ethics committee.  Family Communication:   Discussed with daughter at bedside Discussed with son on a daily basis.  Disposition Plan very poor prognosis  Objective: Vitals:   02/16/18 1704 02/16/18 2052 02/16/18 2242 02/17/18 0739  BP: 137/67 126/71 135/60 (!) 145/74  Pulse: 82 81 72 80  Resp: 20  18 18   Temp: (!) 97.4 F (36.3 C)  97.7 F (36.5 C) 97.7 F (36.5 C)  TempSrc: Oral  Oral Oral  SpO2:   100% 100%  Weight:      Height:  Intake/Output Summary (Last 24 hours) at 02/17/2018 1325 Last data filed at 02/17/2018 0438 Gross per 24 hour  Intake 1764 ml  Output 700 ml  Net 1064 ml   Filed Weights   02/12/18  0500 02/14/18 0550 02/16/18 0500  Weight: 73.5 kg 75.2 kg 78.1 kg    Exam:  Constitutional: Elderly female, chronically ill-appearing, mild distress Eyes: EOMI, anicteric Cardiovascular: RRR no murmur, 2+ pitting edema of bilateral lower and upper extremities ( ankle to thigh) Respiratory: Normal respiratory effort on room air,  difficult to auscultate given positioning in bed Abdomen: Soft,non-tender, distended, umbilical hernia present with non-mobile ,large hardened lymph node nontender with deep palpation, PEG tube in place with no surrounding erythema Skin: No rash ulcers, or lesions. Without skin tenting  Neurologic: Grunting, answering questions with yes and no does not follow commands Data Reviewed: CBC: Recent Labs  Lab 02/13/18 0500 02/14/18 0515 02/15/18 0451 02/16/18 0959 02/17/18 0430  WBC 8.3 7.0 7.9 8.6 8.1  NEUTROABS  --   --  6.1  --   --   HGB 7.3* 7.2* 7.1* 7.5* 7.3*  HCT 23.1* 23.4* 23.5* 24.5* 24.0*  MCV 96.7 95.1 96.7 97.2 95.2  PLT 217 216 213 253 644   Basic Metabolic Panel: Recent Labs  Lab 02/13/18 0500 02/14/18 0515 02/15/18 0451 02/16/18 0500 02/17/18 0430  NA 134* 133* 131* 132* 131*  K 5.6* 5.7* 5.7* 5.5* 5.2*  CL 97* 97* 95* 96* 93*  CO2 24 25 26 24 24   GLUCOSE 147* 129* 113* 109* 129*  BUN 135* 134* 139* 137* 135*  CREATININE 3.98* 4.07* 4.14* 4.09* 3.79*  CALCIUM 8.4* 8.5* 8.5* 8.4* 8.3*  MG  --   --  2.4  --   --   PHOS  --   --   --  5.9* 5.1*   GFR: Estimated Creatinine Clearance: 10.2 mL/min (A) (by C-G formula based on SCr of 3.79 mg/dL (H)). Liver Function Tests: Recent Labs  Lab 02/15/18 0451 02/16/18 0500 02/17/18 0430  AST 55*  --   --   ALT 85*  --   --   ALKPHOS 78  --   --   BILITOT 0.7  --   --   PROT 6.2*  --   --   ALBUMIN 1.8* 1.9* 1.9*   No results for input(s): LIPASE, AMYLASE in the last 168 hours. No results for input(s): AMMONIA in the last 168 hours. Coagulation Profile: No results for input(s):  INR, PROTIME in the last 168 hours. Cardiac Enzymes: No results for input(s): CKTOTAL, CKMB, CKMBINDEX, TROPONINI in the last 168 hours. BNP (last 3 results) No results for input(s): PROBNP in the last 8760 hours. HbA1C: No results for input(s): HGBA1C in the last 72 hours. CBG: Recent Labs  Lab 02/16/18 2052 02/17/18 0044 02/17/18 0403 02/17/18 0739 02/17/18 1158  GLUCAP 93 98 101* 116* 139*   Lipid Profile: No results for input(s): CHOL, HDL, LDLCALC, TRIG, CHOLHDL, LDLDIRECT in the last 72 hours. Thyroid Function Tests: No results for input(s): TSH, T4TOTAL, FREET4, T3FREE, THYROIDAB in the last 72 hours. Anemia Panel: No results for input(s): VITAMINB12, FOLATE, FERRITIN, TIBC, IRON, RETICCTPCT in the last 72 hours. Urine analysis:    Component Value Date/Time   COLORURINE YELLOW 02/14/2018 1238   APPEARANCEUR TURBID (A) 02/14/2018 1238   LABSPEC 1.014 02/14/2018 1238   PHURINE 7.0 02/14/2018 1238   GLUCOSEU NEGATIVE 02/14/2018 1238   HGBUR SMALL (A) 02/14/2018 [place] 02/14/2018 1238  KETONESUR NEGATIVE 02/14/2018 1238   PROTEINUR 100 (A) 02/14/2018 1238   UROBILINOGEN 0.2 06/30/2011 1214   NITRITE NEGATIVE 02/14/2018 1238   LEUKOCYTESUR LARGE (A) 02/14/2018 1238   Studies: No results found.  Scheduled Meds: . atorvastatin  40 mg Per Tube q1800  . chlorhexidine  15 mL Mouth Rinse BID  . cholecalciferol  400 Units Per Tube Daily  . clopidogrel  75 mg Per Tube Daily  . collagenase   Topical Daily  . famotidine  10 mg Oral Daily  . feeding supplement (NEPRO CARB STEADY)  1,000 mL Per Tube Q24H  . ferrous sulfate  300 mg Per Tube Daily  . free water  300 mL Per Tube Q8H  . insulin aspart  0-15 Units Subcutaneous Q4H  . insulin detemir  12 Units Subcutaneous Daily  . isosorbide-hydrALAZINE  0.5 tablet Per Tube TID  . mouth rinse  15 mL Mouth Rinse q12n4p  . metoprolol tartrate  25 mg Per Tube BID  . multivitamin  15 mL Per Tube Daily  .  patiromer  8.4 g Oral Daily    Continuous Infusions: . sodium chloride Stopped (01/08/18 2158)     LOS: 70 days     Author:  Berle Mull, MD [facility] 02/17/2018  If 7PM-7AM, please contact night-coverage To reach On-call, see [email or web address]   If 7PM-7AM, please contact night-coverage [email or web address] Password [email or web address] 02/17/2018, 1:25 PM

## 2018-02-17 NOTE — Progress Notes (Signed)
VASCULAR LAB PRELIMINARY  PRELIMINARY  PRELIMINARY  PRELIMINARY  Left lower extremity venous duplex completed.    Preliminary report:  See results in CV Proc  Nara Paternoster, RVT 02/17/2018, 1:29 PM

## 2018-02-18 LAB — RENAL FUNCTION PANEL
Albumin: 1.9 g/dL — ABNORMAL LOW (ref 3.5–5.0)
Anion gap: 12 (ref 5–15)
BUN: 136 mg/dL — ABNORMAL HIGH (ref 8–23)
CALCIUM: 8.4 mg/dL — AB (ref 8.9–10.3)
CO2: 25 mmol/L (ref 22–32)
Chloride: 94 mmol/L — ABNORMAL LOW (ref 98–111)
Creatinine, Ser: 3.9 mg/dL — ABNORMAL HIGH (ref 0.44–1.00)
GFR calc Af Amer: 11 mL/min — ABNORMAL LOW (ref >60)
GFR calc non Af Amer: 10 mL/min — ABNORMAL LOW (ref >60)
Glucose, Bld: 135 mg/dL — ABNORMAL HIGH (ref 70–99)
Phosphorus: 4.7 mg/dL — ABNORMAL HIGH (ref 2.5–4.6)
Potassium: 5.1 mmol/L (ref 3.5–5.1)
SODIUM: 131 mmol/L — AB (ref 135–145)

## 2018-02-18 LAB — CBC
HCT: 23.5 % — ABNORMAL LOW (ref 36.0–46.0)
Hemoglobin: 7.3 g/dL — ABNORMAL LOW (ref 12.0–15.0)
MCH: 30.2 pg (ref 26.0–34.0)
MCHC: 31.1 g/dL (ref 30.0–36.0)
MCV: 97.1 fL (ref 80.0–100.0)
PLATELETS: 253 10*3/uL (ref 150–400)
RBC: 2.42 MIL/uL — ABNORMAL LOW (ref 3.87–5.11)
RDW: 16.3 % — ABNORMAL HIGH (ref 11.5–15.5)
WBC: 8.3 10*3/uL (ref 4.0–10.5)
nRBC: 0 % (ref 0.0–0.2)

## 2018-02-18 LAB — GLUCOSE, CAPILLARY
Glucose-Capillary: 131 mg/dL — ABNORMAL HIGH (ref 70–99)
Glucose-Capillary: 134 mg/dL — ABNORMAL HIGH (ref 70–99)
Glucose-Capillary: 143 mg/dL — ABNORMAL HIGH (ref 70–99)
Glucose-Capillary: 147 mg/dL — ABNORMAL HIGH (ref 70–99)
Glucose-Capillary: 150 mg/dL — ABNORMAL HIGH (ref 70–99)

## 2018-02-18 NOTE — Progress Notes (Signed)
PROGRESS NOTE  [name] JSE:[id number] DOB: [birth year] DOA: 12/05/2017 PCP: Deland Pretty, MD  HPI/Brief Narrative  [name] is a 85 y.o. year old female with medical history significant for CAD s/p CABG, recent ischemic stroke, HTN, DM, HLD, stage 3 CKD recent admission from 10/5-10/25 for failure to thrive with profound weight loss due to swallowing problems s/p botox injection for achalasia, who presented on  12/05/2017 for admission from her SNF due to difficulty speaking which progressed tp aphasia.  Patient has had prolonged hospital course significant for peg tube placement by IR (12/11/2018) due to dysphagia from achalasia, NSTEMI, respiratory failure requiring intubation (12/2-12/6, extubated), Enterococcus UTI/Klebsiella pneumonia, anemia and now worsening renal failure. Palliative has been involved intermittently throughout hospital stay, but her son wants FULL code as he feels strongly she will not receive adequate care if she is made DNR/DNI. Currently active issue is acute kidney injury on chronic kidney disease stage III with progressive uremia and hyperkalemia. This appears to be multifactorial although current primary issue is cardiorenal hemodynamics per nephrology.  Nephrology will with the patient and multiple nephrologist has felt that the patient is not a candidate for hemodialysis. At family request we have tried transfer to tertiary care center ([place]) but they are at capacity and is unable to accept the patient.  the case was reviewed with the on-call nephrologist at [facility] nephrologist in a consultation capacity on 02/13/2018 who agreed with our nephrologist's assessment that the patient is not a dialysis candidate given her multiple comorbidities and age she would not tolerate this procedure and advised continue goals of care discussions.  Hospital events 11/10 PEG placed poor oral intake related 11/19 General surgery consulted, found to have  nonincarcerated umbilical hernia no surgical needs and lymph node within hernia. 11/21 developed respiratory failure due to to significant volume overload from CHF exacerbation, treated aggressively with IV diuretics, NSTEMI medically managed, EF found to be 30% at that time 11/27 ENT consulted for stridor, flexible endoscopy negative 12/2 intubated for persistent pulmonary edema/hypervolemic status from heart failure flare (critical care was consulted for agonal respirations prompting transfer to ICU), with extensive IV diuresis, extubated 12/6 12/17 persistent AKI/worsening of GFR 12/18 transfer to progressive unit 1/7, nephrology deems not a dialysis candidate given age and multiple comorbidities/Poor functional status, progressive worsening kidney dysfunction, not improving with any medical management for CHF flare 1/12 advanced heart failure does not recommend any further medical management for CHF with reduced EF, and that encourages goals of care discussion 1/13 patient son like to transfer to tertiary care center for initiation of dialysis,  1/15 I personally discussed with [facility] as well as [facility] both of which currently are at full capacity and unable to take the patient.  Patient is not placed on any wait list on both facilities.  Changing Jevity to Nepro and increasing free water. 1/17 nephrology reconsulted for final recommendation.  Advanced heart failure team was also reconsulted. 1/18 patient appears to be more volume overloaded, fluids stopped.  Continue with free water.  Lower extremity Doppler done, ruled out DVT.  Shows anasarca.  Subjective Lethargic this morning.  Again no nausea no vomiting.  No fever no chills.  Having bowel movements regularly.  No blood in the stool.  Swelling appears to be somewhat better than yesterday.  Assessment/Plan:  AKI on CKD stage III (multifactorial etiology),  Seems to be progressing to end-stage renal  disease Suspect related to fluid shifts/initial pre-renal  etiology  from recent infections and poor oral intake with hypotension (initially treated with IVF), CHF exacerbations, aspiration pneumonia CVA,.   Given persistent vascular congestion, anasarca/hypervolemia suspect the predominant force is cardiorenal syndrome.  Fena 0.6 on 02/15/2018, prerenal versus cardiorenal although the patient's weight is going up as well as patient has third spacing in her leg therefore consistent with cardiorenal right now. maintaining normal saturation. Appreciate nephrology assistance.  Patient is felt not a candidate for HD or PD due to her multiple comorbidities. Daily engagement with patient's son Dr. Mayer Masker. On Nepro. renal function is actually improving but patient appears to be more volume overloaded and therefore recommendation is to stop the fluid. Discussed with family on 02/18/2018, her son wants to reconsider trial of fluid again possibly tomorrow.  My recommendation would be I would be waiting for at least 48 hours before resuming any kind of fluid given the profound volume overload that the patient has have the family wants to insist on some sort of IV intervention would recommend colloidal solution at the lowest possible rate followed by Lasix.  Goals of care Patient has very poor prognosis.   No progress in goals of care discussions Ethics committee consulted on 1/11 and currently considering family meeting. Son believes he is acting in mother's wishes as she has 2 brothers in the late 105s on dialysis and doing well.  Dr. Sheppard Coil recommends touching base with the daughter to ensure she knows she is also a valid decision-maker along side her brother. In the past, son has expressed that his mother should be given all the chances possible and if it were not for her he would not be where he is right now. On 02/16/2018 had a prolonged discussion with son regarding my concern regarding patient's CODE  STATUS. While the son appreciate that the patient's chances of surviving a cardiac arrest is less than 5% and even if she survives her neurological recovery after such a major event is next to nil, he would still like to maintain full code, despite knowing that the patient is deemed not a candidate for dialysis and multiple nephrologist and not a candidate for inotropes by advanced heart failure team as well as repeat echocardiogram performed on 02/16/2018 shows persistently low EF with moderate MR, now with grade 2 diastolic dysfunction as well as RV dysfunction.  He mentions, " changing her CODE STATUS to DNR/DNI would mean she would not get any aggressive treatment"  Acute hypoxic respiratory failure  Acute on chronic combined diastolic and systolic CHF Moderate MR, moderate RV systolic dysfunction Moderate bilateral pleural effusion anasarca She had multiple acute hypoxic respiratory events secondary to volume overload Likely related to hypoalbuminemia in addition to CHF flare, and now worsening b/l pleural effusion on imaging.   She has not responded well to medical management. hold off on any diuretic therapy given her poor kidney function, continue BiDil for afterload reduction, daily weights, strict I/os.   [name] has been informed multiple times by advanced heart failure team that patient is not a candidate for inotropic agents.  Patient does have edema in all 4 extremities left leg appears to be more swollen than the right and therefore her extremity venous Doppler was ordered.  Which is negative.  Acute metabolic encephalopathy, fluctuating between lethargy, agitation Still fluctuating ability to converse  Occasionally says hello/good morning, is correctly answering simple math equations that her son gives her at bedside,  but then will be lethargic and will not be responding the  next day. Intermittently answers questions with yes or no, able to say she is nauseous and in pain  intermittently otherwise non-sensical or grunts. multifactorial etiology hospital-acquired delirium, uremia related to AKI on CKD, oxygen requirements, suspected vascular dementia related to chronic infarcts in addition to CVA here. Prior to this admission in September 2019 patient was alert and oriented x4 during most of her ADLs and very well-functioning.  EEG unremarkable.  No longer on Seroquel. delirium precautions  Acute on chronic anemia chronic disease, most likely CKD Baseline hemoglobin prior to hospital stay 10-11.   Hemoglobin here persistently remain around 7 to 8. Closely monitor CBCs. Goal hemoglobin greater than 8, however concern patient will not tolerate volume for blood transfusion given reduced EF and significant pulmonary edema/pleural effusion with limited ability to diurese given kidney function.  Dysphagia related to achalasia with failed botox/moderate malnutrition/failure to thrive Continue PEG tube feeding,   Vaginal bleeding secondary to uterine fibroma Hemoglobin remained stable, monitor CBC   Hypernatremia, resolved Secondary to free water deficit, resolved with increasing free water via PEG tube  ESBL Klebsiella aspiration pneumonia Enterococcus UTI Completed antibiotic course  NSTEMI, resolved Cardiology recommended medical management only.   Continue atorvastatin Plavix, Lopressor  Total 3 punctate left temporal and frontal punctate CVA Evaluated by neurology.   No work-up TTE was unremarkable for vegetation or clot.   We will continue Plavix and atorvastatin  Type 2 diabetes, controlled with renal complication. A1c 5.9 (12/2017, down from 9 on 10/2017) fasting glucose consistently in control Currently on detemir 12 units daily while on tube feeds, continue to closely monitor CBGs.  Tardive dyskinesia Diagnosed this hospitalization (01/18/2018) presumed related to reglan.  Cogentin Has been discontinued( on 02/09/18)  Lymph node within umbilical  hernia, increased in size Known [facility] lymph node on previous CT evaluations, imaging from 1/10 showed increased size.  Son does not want core biopsy as he is less concern for malignancy given no other evidence of lymphadenopathy on imaging.  Umbilical hernia without incarceration Surgery has evaluated twice most recently on 1/10.  Hardened mass on exam is consistent with lymph node as mentioned above  DVT prophylaxis: Consultants: Neurology, cardiology, surgery, critical care/home, IR, gastroenterology  Code Status: Full code  Ethics. Patient with multiple comorbidities as mentioned above, not a candidate for any dialysis treatment per nephrology or any other aggressive intervention from cardiology.  Son wants to continue full code, wants to pursue options for hemodialysis, requesting surgical procedure-PD catheter placement which would be deemed high risk in current set up. (Estimated Risk Probability for Perioperative Myocardial Infarction or Cardiac Arrest 15.56 % Lyndel Safe PK et.al).  Given his severely poor prognosis I recommended family to consider her changing CODE STATUS to DNR which is currently full code.  Son understand that the patient's chances of survival of a cardiac arrest is less than 5% and meaningful recovery is negligible and still would like to maintain full code.  Sons rationality is that she may not get aggressive care if she is DNR/DNI and says that "in his business 5% is big."  Patient is at very high risk for major event primary due to her volume overload. Son has been informed that 2 tertiary care facilities have felt that the patient's transfer is not medically necessary. Ethics committee is currently involved.  We will discuss appropriate timing for arranging a family meeting with ethics committee.  Family Communication:   Discussed with daughter at bedside Discussed with son on a daily basis.  Disposition Plan very poor prognosis  Objective: Vitals:   02/17/18  2227 02/17/18 2229 02/18/18 0500 02/18/18 0902  BP: (!) 145/86 (!) 145/86  110/66  Pulse: 90 92  76  Resp:  20  20  Temp:  97.7 F (36.5 C)  98 F (36.7 C)  TempSrc:  Oral  Axillary  SpO2:  99%  100%  Weight:   77.7 kg   Height:        Intake/Output Summary (Last 24 hours) at 02/18/2018 1425 Last data filed at 02/18/2018 1300 Gross per 24 hour  Intake 1178 ml  Output 601 ml  Net 577 ml   Filed Weights   02/14/18 0550 02/16/18 0500 02/18/18 0500  Weight: 75.2 kg 78.1 kg 77.7 kg    Exam:  Constitutional: Elderly female, chronically ill-appearing, mild distress Eyes: EOMI, anicteric Cardiovascular: RRR no murmur, 2+ pitting edema of bilateral lower and upper extremities ( ankle to thigh) Respiratory: Normal respiratory effort on room air,  difficult to auscultate given positioning in bed Abdomen: Soft,non-tender, distended, umbilical hernia present with non-mobile ,large hardened lymph node nontender with deep palpation, PEG tube in place with no surrounding erythema Skin: No rash ulcers, or lesions. Without skin tenting  Neurologic: Grunting, answering questions with yes and no does not follow commands Data Reviewed: CBC: Recent Labs  Lab 02/14/18 0515 02/15/18 0451 02/16/18 0959 02/17/18 0430 02/18/18 0434  WBC 7.0 7.9 8.6 8.1 8.3  NEUTROABS  --  6.1  --   --   --   HGB 7.2* 7.1* 7.5* 7.3* 7.3*  HCT 23.4* 23.5* 24.5* 24.0* 23.5*  MCV 95.1 96.7 97.2 95.2 97.1  PLT 216 213 253 229 694   Basic Metabolic Panel: Recent Labs  Lab 02/14/18 0515 02/15/18 0451 02/16/18 0500 02/17/18 0430 02/18/18 0434  NA 133* 131* 132* 131* 131*  K 5.7* 5.7* 5.5* 5.2* 5.1  CL 97* 95* 96* 93* 94*  CO2 25 26 24 24 25   GLUCOSE 129* 113* 109* 129* 135*  BUN 134* 139* 137* 135* 136*  CREATININE 4.07* 4.14* 4.09* 3.79* 3.90*  CALCIUM 8.5* 8.5* 8.4* 8.3* 8.4*  MG  --  2.4  --   --   --   PHOS  --   --  5.9* 5.1* 4.7*   GFR: Estimated Creatinine Clearance: 9.8 mL/min (A) (by C-G  formula based on SCr of 3.9 mg/dL (H)). Liver Function Tests: Recent Labs  Lab 02/15/18 0451 02/16/18 0500 02/17/18 0430 02/18/18 0434  AST 55*  --   --   --   ALT 85*  --   --   --   ALKPHOS 78  --   --   --   BILITOT 0.7  --   --   --   PROT 6.2*  --   --   --   ALBUMIN 1.8* 1.9* 1.9* 1.9*   No results for input(s): LIPASE, AMYLASE in the last 168 hours. No results for input(s): AMMONIA in the last 168 hours. Coagulation Profile: No results for input(s): INR, PROTIME in the last 168 hours. Cardiac Enzymes: No results for input(s): CKTOTAL, CKMB, CKMBINDEX, TROPONINI in the last 168 hours. BNP (last 3 results) No results for input(s): PROBNP in the last 8760 hours. HbA1C: No results for input(s): HGBA1C in the last 72 hours. CBG: Recent Labs  Lab 02/17/18 1703 02/18/18 0014 02/18/18 0415 02/18/18 0743 02/18/18 1140  GLUCAP 154* 147* 134* 131* 143*   Lipid Profile: No results for input(s): CHOL, HDL, LDLCALC,  TRIG, CHOLHDL, LDLDIRECT in the last 72 hours. Thyroid Function Tests: No results for input(s): TSH, T4TOTAL, FREET4, T3FREE, THYROIDAB in the last 72 hours. Anemia Panel: No results for input(s): VITAMINB12, FOLATE, FERRITIN, TIBC, IRON, RETICCTPCT in the last 72 hours. Urine analysis:    Component Value Date/Time   COLORURINE YELLOW 02/14/2018 1238   APPEARANCEUR TURBID (A) 02/14/2018 1238   LABSPEC 1.014 02/14/2018 1238   PHURINE 7.0 02/14/2018 1238   GLUCOSEU NEGATIVE 02/14/2018 1238   HGBUR SMALL (A) 02/14/2018 1238   BILIRUBINUR NEGATIVE 02/14/2018 1238   KETONESUR NEGATIVE 02/14/2018 1238   PROTEINUR 100 (A) 02/14/2018 1238   UROBILINOGEN 0.2 06/30/2011 1214   NITRITE NEGATIVE 02/14/2018 1238   LEUKOCYTESUR LARGE (A) 02/14/2018 1238   Studies: No results found.  Scheduled Meds: . atorvastatin  40 mg Per Tube q1800  . chlorhexidine  15 mL Mouth Rinse BID  . cholecalciferol  400 Units Per Tube Daily  . clopidogrel  75 mg Per Tube Daily  .  collagenase   Topical Daily  . famotidine  10 mg Oral Daily  . feeding supplement (NEPRO CARB STEADY)  1,000 mL Per Tube Q24H  . ferrous sulfate  300 mg Per Tube Daily  . free water  300 mL Per Tube Q8H  . insulin aspart  0-15 Units Subcutaneous Q4H  . insulin detemir  12 Units Subcutaneous Daily  . isosorbide-hydrALAZINE  0.5 tablet Per Tube TID  . mouth rinse  15 mL Mouth Rinse q12n4p  . metoprolol tartrate  25 mg Per Tube BID  . multivitamin  15 mL Per Tube Daily  . patiromer  8.4 g Oral Daily    Continuous Infusions: . sodium chloride Stopped (01/08/18 2158)     LOS: 71 days     Author:  Berle Mull, MD [facility] 02/18/2018  If 7PM-7AM, please contact night-coverage To reach On-call, see [email or web address]   If 7PM-7AM, please contact night-coverage [email or web address] Password [email or web address] 02/18/2018, 2:25 PM

## 2018-02-19 LAB — CBC
HCT: 23.6 % — ABNORMAL LOW (ref 36.0–46.0)
Hemoglobin: 7.3 g/dL — ABNORMAL LOW (ref 12.0–15.0)
MCH: 30.4 pg (ref 26.0–34.0)
MCHC: 30.9 g/dL (ref 30.0–36.0)
MCV: 98.3 fL (ref 80.0–100.0)
Platelets: 242 10*3/uL (ref 150–400)
RBC: 2.4 MIL/uL — AB (ref 3.87–5.11)
RDW: 16.3 % — ABNORMAL HIGH (ref 11.5–15.5)
WBC: 7.3 10*3/uL (ref 4.0–10.5)
nRBC: 0 % (ref 0.0–0.2)

## 2018-02-19 LAB — BASIC METABOLIC PANEL
Anion gap: 14 (ref 5–15)
BUN: 145 mg/dL — ABNORMAL HIGH (ref 8–23)
CO2: 23 mmol/L (ref 22–32)
Calcium: 8.5 mg/dL — ABNORMAL LOW (ref 8.9–10.3)
Chloride: 91 mmol/L — ABNORMAL LOW (ref 98–111)
Creatinine, Ser: 4.02 mg/dL — ABNORMAL HIGH (ref 0.44–1.00)
GFR calc Af Amer: 11 mL/min — ABNORMAL LOW (ref >60)
GFR calc non Af Amer: 9 mL/min — ABNORMAL LOW (ref >60)
Glucose, Bld: 181 mg/dL — ABNORMAL HIGH (ref 70–99)
Potassium: 5.1 mmol/L (ref 3.5–5.1)
Sodium: 128 mmol/L — ABNORMAL LOW (ref 135–145)

## 2018-02-19 LAB — RENAL FUNCTION PANEL
Albumin: 1.8 g/dL — ABNORMAL LOW (ref 3.5–5.0)
Anion gap: 12 (ref 5–15)
BUN: 140 mg/dL — ABNORMAL HIGH (ref 8–23)
CALCIUM: 8.5 mg/dL — AB (ref 8.9–10.3)
CO2: 24 mmol/L (ref 22–32)
Chloride: 92 mmol/L — ABNORMAL LOW (ref 98–111)
Creatinine, Ser: 4 mg/dL — ABNORMAL HIGH (ref 0.44–1.00)
GFR calc Af Amer: 11 mL/min — ABNORMAL LOW (ref >60)
GFR calc non Af Amer: 9 mL/min — ABNORMAL LOW (ref >60)
Glucose, Bld: 133 mg/dL — ABNORMAL HIGH (ref 70–99)
Phosphorus: 4.5 mg/dL (ref 2.5–4.6)
Potassium: 5 mmol/L (ref 3.5–5.1)
Sodium: 128 mmol/L — ABNORMAL LOW (ref 135–145)

## 2018-02-19 LAB — GLUCOSE, CAPILLARY
Glucose-Capillary: 113 mg/dL — ABNORMAL HIGH (ref 70–99)
Glucose-Capillary: 122 mg/dL — ABNORMAL HIGH (ref 70–99)
Glucose-Capillary: 124 mg/dL — ABNORMAL HIGH (ref 70–99)
Glucose-Capillary: 152 mg/dL — ABNORMAL HIGH (ref 70–99)
Glucose-Capillary: 154 mg/dL — ABNORMAL HIGH (ref 70–99)
Glucose-Capillary: 153 mg/dL — ABNORMAL HIGH (ref 70–99)
Glucose-Capillary: 161 mg/dL — ABNORMAL HIGH (ref 70–99)
Glucose-Capillary: 167 mg/dL — ABNORMAL HIGH (ref 70–99)

## 2018-02-19 MED ORDER — FREE WATER
200.0000 mL | Freq: Three times a day (TID) | Status: DC
  Administered 2018-02-19 – 2018-02-20 (×3): 200 mL

## 2018-02-19 NOTE — Progress Notes (Signed)
PROGRESS NOTE  [name] RCV:[id number] DOB: Apr 27, 1927 DOA: 12/05/2017 PCP: Deland Pretty, MD  HPI/Brief Narrative  [name] is a 85 y.o. year old female with medical history significant for CAD s/p CABG, recent ischemic stroke, HTN, DM, HLD, stage 3 CKD recent admission from 10/5-10/25 for failure to thrive with profound weight loss due to swallowing problems s/p botox injection for achalasia, who presented on  12/05/2017 for admission from her SNF due to difficulty speaking which progressed tp aphasia.  Patient has had prolonged hospital course significant for peg tube placement by IR (12/11/2018) due to dysphagia from achalasia, NSTEMI, respiratory failure requiring intubation (12/2-12/6, extubated), Enterococcus UTI/Klebsiella pneumonia, anemia and now worsening renal failure. Palliative has been involved intermittently throughout hospital stay, but her son wants FULL code as he feels strongly she will not receive adequate care if she is made DNR/DNI. Currently active issue is acute kidney injury on chronic kidney disease stage III with progressive uremia and hyperkalemia. This appears to be multifactorial although current primary issue is cardiorenal hemodynamics per nephrology.  Nephrology will with the patient and multiple nephrologist has felt that the patient is not a candidate for hemodialysis. At family request we have tried transfer to tertiary care center ([facility]) but they are at capacity and is unable to accept the patient.  the case was reviewed with the on-call nephrologist at [facility] nephrologist in a consultation capacity on 02/13/2018 who agreed with our nephrologist's assessment that the patient is not a dialysis candidate given her multiple comorbidities and age she would not tolerate this procedure and advised continue goals of care discussions.  Hospital events 11/10 PEG placed poor oral intake related 11/19 General surgery consulted, found to have  nonincarcerated umbilical hernia no surgical needs and lymph node within hernia. 11/21 developed respiratory failure due to to significant volume overload from CHF exacerbation, treated aggressively with IV diuretics, NSTEMI medically managed, EF found to be 30% at that time 11/27 ENT consulted for stridor, flexible endoscopy negative 12/2 intubated for persistent pulmonary edema/hypervolemic status from heart failure flare (critical care was consulted for agonal respirations prompting transfer to ICU), with extensive IV diuresis, extubated 12/6 12/17 persistent AKI/worsening of GFR 12/18 transfer to progressive unit 1/7, nephrology deems not a dialysis candidate given age and multiple comorbidities/Poor functional status, progressive worsening kidney dysfunction, not improving with any medical management for CHF flare 1/12 advanced heart failure does not recommend any further medical management for CHF with reduced EF, and that encourages goals of care discussion 1/13 patient son like to transfer to tertiary care center for initiation of dialysis,  1/15 I personally discussed with [facility] as well as [facility] both of which currently are at full capacity and unable to take the patient.  Patient is not placed on any wait list on both facilities.  Changing Jevity to Nepro and increasing free water. 1/17 nephrology reconsulted for final recommendation.  Advanced heart failure team was also reconsulted. 1/18 patient appears to be more volume overloaded, fluids stopped.  Continue with free water.  Lower extremity Doppler done, ruled out DVT.  Shows anasarca. 1/19 patient's son discussed with chief of medical staff who in turn discussed the case with me.  Family wants to consider normal saline, a clearly explained that the patient is suffering from severe anasarca and it would not be in her best interest.  For hyponatremia we reduce the free water and will recheck the sodium later in  the afternoon.  Subjective Does not follow commands, continues to remain agitated.  No nausea no vomiting no fever no chills.  Assessment/Plan:  AKI on CKD stage III (multifactorial etiology),  Seems to be progressing to end-stage renal disease Suspect related to fluid shifts/initial pre-renal etiology  from recent infections and poor oral intake with hypotension (initially treated with IVF), CHF exacerbations, aspiration pneumonia CVA,.   Given persistent vascular congestion, anasarca/hypervolemia suspect the predominant force is cardiorenal syndrome.  Fena 0.6 on 02/15/2018, prerenal versus cardiorenal although the patient's weight is going up as well as patient has third spacing in her leg therefore consistent with cardiorenal right now. maintaining normal saturation. Appreciate nephrology assistance.  Patient is felt not a candidate for HD or PD due to her multiple comorbidities. Daily engagement with patient's son Dr. Mayer Masker. On Nepro. renal function is actually improving but patient appears to be more volume overloaded and therefore recommendation is to stop the fluid. Son is worried that the patient is suffering from SIADH.  Patient is actually oliguric and therefore her symptoms are not compatible with SIADH.  The drop in the sodium is fairly simple secondary to excessive free water and this was clearly explained to patient's family. They were still adamant about checking ADH.  Goals of care Patient has very poor prognosis.   No progress in goals of care discussions Ethics committee consulted on 1/11 and currently considering family meeting. Son believes he is acting in mother's wishes as she has 2 brothers in the late 40s on dialysis and doing well.  Dr. Sheppard Coil recommends touching base with the daughter to ensure she knows she is also a valid decision-maker along side her brother. In the past, son has expressed that his mother should be given all the chances possible and if it were  not for her he would not be where he is right now. On 02/16/2018 had a prolonged discussion with son regarding my concern regarding patient's CODE STATUS. While the son appreciate that the patient's chances of surviving a cardiac arrest is less than 5% and even if she survives her neurological recovery after such a major event is next to nil, he would still like to maintain full code, despite knowing that the patient is deemed not a candidate for dialysis and multiple nephrologist and not a candidate for inotropes by advanced heart failure team as well as repeat echocardiogram performed on 02/16/2018 shows persistently low EF with moderate MR, now with grade 2 diastolic dysfunction as well as RV dysfunction.  He mentions, " changing her CODE STATUS to DNR/DNI would mean she would not get any aggressive treatment"  Acute hypoxic respiratory failure  Acute on chronic combined diastolic and systolic CHF Moderate MR, moderate RV systolic dysfunction Moderate bilateral pleural effusion anasarca She had multiple acute hypoxic respiratory events secondary to volume overload Likely related to hypoalbuminemia in addition to CHF flare, and now worsening b/l pleural effusion on imaging.   She has not responded well to medical management. hold off on any diuretic therapy given her poor kidney function, continue BiDil for afterload reduction, daily weights, strict I/os.   [name] has been informed multiple times by advanced heart failure team that patient is not a candidate for inotropic agents.  Patient does have edema in all 4 extremities left leg appears to be more swollen than the right and therefore her extremity venous Doppler was ordered.  Which is negative.  Acute metabolic encephalopathy, fluctuating between lethargy, agitation Still fluctuating ability to converse  Occasionally says  hello/good morning, is correctly answering simple math equations that her son gives her at bedside,  but then will  be lethargic and will not be responding the next day. Intermittently answers questions with yes or no, able to say she is nauseous and in pain intermittently otherwise non-sensical or grunts. multifactorial etiology hospital-acquired delirium, uremia related to AKI on CKD, oxygen requirements, suspected vascular dementia related to chronic infarcts in addition to CVA here. Prior to this admission in September 2019 patient was alert and oriented x4 during most of her ADLs and very well-functioning.  EEG unremarkable.  No longer on Seroquel. delirium precautions  Acute on chronic anemia chronic disease, most likely CKD Baseline hemoglobin prior to hospital stay 10-11.   Hemoglobin here persistently remain around 7 to 8. Closely monitor CBCs. Goal hemoglobin greater than 8, however concern patient will not tolerate volume for blood transfusion given reduced EF and significant pulmonary edema/pleural effusion with limited ability to diurese given kidney function.  Dysphagia related to achalasia with failed botox/moderate malnutrition/failure to thrive Continue PEG tube feeding,   Vaginal bleeding secondary to uterine fibroma Hemoglobin remained stable, monitor CBC   Hypernatremia, resolved Secondary to free water deficit, resolved with increasing free water via PEG tube  ESBL Klebsiella aspiration pneumonia Enterococcus UTI Completed antibiotic course  NSTEMI, resolved Cardiology recommended medical management only.   Continue atorvastatin Plavix, Lopressor  Total 3 punctate left temporal and frontal punctate CVA Evaluated by neurology.   No work-up TTE was unremarkable for vegetation or clot.   We will continue Plavix and atorvastatin  Type 2 diabetes, controlled with renal complication. A1c 5.9 (12/2017, down from 9 on 10/2017) fasting glucose consistently in control Currently on detemir 12 units daily while on tube feeds, continue to closely monitor CBGs.  Tardive  dyskinesia Diagnosed this hospitalization (01/18/2018) presumed related to reglan.  Cogentin Has been discontinued( on 02/09/18)  Lymph node within umbilical hernia, increased in size Known [facility] lymph node on previous CT evaluations, imaging from 1/10 showed increased size.  Son does not want core biopsy as he is less concern for malignancy given no other evidence of lymphadenopathy on imaging.  Umbilical hernia without incarceration Surgery has evaluated twice most recently on 1/10.  Hardened mass on exam is consistent with lymph node as mentioned above  DVT prophylaxis: Consultants: Neurology, cardiology, surgery, critical care/home, IR, gastroenterology  Code Status: Full code  Ethics. Patient with multiple comorbidities as mentioned above, not a candidate for any dialysis treatment per nephrology or any other aggressive intervention from cardiology.  Son wants to continue full code, wants to pursue options for hemodialysis, requesting surgical procedure-PD catheter placement which would be deemed high risk in current set up. (Estimated Risk Probability for Perioperative Myocardial Infarction or Cardiac Arrest 15.56 % Lyndel Safe PK et.al).  Given his severely poor prognosis I recommended family to consider her changing CODE STATUS to DNR which is currently full code.  Son understand that the patient's chances of survival of a cardiac arrest is less than 5% and meaningful recovery is negligible and still would like to maintain full code.  Sons rationality is that she may not get aggressive care if she is DNR/DNI and says that "in his business 5% is big."  Patient is at very high risk for major event primary due to her volume overload. Son has been informed that 2 tertiary care facilities have felt that the patient's transfer is not medically necessary. Ethics committee is currently involved.  We will discuss appropriate  timing for arranging a family meeting with Audiological scientist.  Family  Communication:   Discussed with daughter at bedside Discussed with son on a daily basis.  Disposition Plan very poor prognosis  Objective: Vitals:   02/18/18 0902 02/18/18 1653 02/18/18 2149 02/19/18 0739  BP: 110/66 131/73 (!) 128/94 119/65  Pulse: 76 76 83 75  Resp: 20 20 20    Temp: 98 F (36.7 C) (!) 97.4 F (36.3 C) 97.7 F (36.5 C) 97.9 F (36.6 C)  TempSrc: Axillary Oral Oral Oral  SpO2: 100% 100% 100% 100%  Weight:      Height:        Intake/Output Summary (Last 24 hours) at 02/19/2018 1816 Last data filed at 02/19/2018 0900 Gross per 24 hour  Intake 600 ml  Output 225 ml  Net 375 ml   Filed Weights   02/14/18 0550 02/16/18 0500 02/18/18 0500  Weight: 75.2 kg 78.1 kg 77.7 kg    Exam:  Constitutional: Elderly female, chronically ill-appearing, mild distress Eyes: EOMI, anicteric Cardiovascular: RRR no murmur, 2+ pitting edema of bilateral lower and upper extremities ( ankle to thigh) Respiratory: Normal respiratory effort on room air,  difficult to auscultate given positioning in bed Abdomen: Soft,non-tender, distended, umbilical hernia present with non-mobile ,large hardened lymph node nontender with deep palpation, PEG tube in place with no surrounding erythema Skin: No rash ulcers, or lesions. Without skin tenting  Neurologic: Grunting, answering questions with yes and no does not follow commands Data Reviewed: CBC: Recent Labs  Lab 02/15/18 0451 02/16/18 0959 02/17/18 0430 02/18/18 0434 02/19/18 0636  WBC 7.9 8.6 8.1 8.3 7.3  NEUTROABS 6.1  --   --   --   --   HGB 7.1* 7.5* 7.3* 7.3* 7.3*  HCT 23.5* 24.5* 24.0* 23.5* 23.6*  MCV 96.7 97.2 95.2 97.1 98.3  PLT 213 253 229 253 163   Basic Metabolic Panel: Recent Labs  Lab 02/15/18 0451 02/16/18 0500 02/17/18 0430 02/18/18 0434 02/19/18 0636  NA 131* 132* 131* 131* 128*  K 5.7* 5.5* 5.2* 5.1 5.0  CL 95* 96* 93* 94* 92*  CO2 26 24 24 25 24   GLUCOSE 113* 109* 129* 135* 133*  BUN 139* 137* 135*  136* 140*  CREATININE 4.14* 4.09* 3.79* 3.90* 4.00*  CALCIUM 8.5* 8.4* 8.3* 8.4* 8.5*  MG 2.4  --   --   --   --   PHOS  --  5.9* 5.1* 4.7* 4.5   GFR: Estimated Creatinine Clearance: 9.6 mL/min (A) (by C-G formula based on SCr of 4 mg/dL (H)). Liver Function Tests: Recent Labs  Lab 02/15/18 0451 02/16/18 0500 02/17/18 0430 02/18/18 0434 02/19/18 0636  AST 55*  --   --   --   --   ALT 85*  --   --   --   --   ALKPHOS 78  --   --   --   --   BILITOT 0.7  --   --   --   --   PROT 6.2*  --   --   --   --   ALBUMIN 1.8* 1.9* 1.9* 1.9* 1.8*   No results for input(s): LIPASE, AMYLASE in the last 168 hours. No results for input(s): AMMONIA in the last 168 hours. Coagulation Profile: No results for input(s): INR, PROTIME in the last 168 hours. Cardiac Enzymes: No results for input(s): CKTOTAL, CKMB, CKMBINDEX, TROPONINI in the last 168 hours. BNP (last 3 results) No results for input(s): PROBNP in  the last 8760 hours. HbA1C: No results for input(s): HGBA1C in the last 72 hours. CBG: Recent Labs  Lab 02/19/18 0030 02/19/18 0356 02/19/18 0733 02/19/18 1153 02/19/18 1704  GLUCAP 154* 124* 113* 153* 161*   Lipid Profile: No results for input(s): CHOL, HDL, LDLCALC, TRIG, CHOLHDL, LDLDIRECT in the last 72 hours. Thyroid Function Tests: No results for input(s): TSH, T4TOTAL, FREET4, T3FREE, THYROIDAB in the last 72 hours. Anemia Panel: No results for input(s): VITAMINB12, FOLATE, FERRITIN, TIBC, IRON, RETICCTPCT in the last 72 hours. Urine analysis:    Component Value Date/Time   COLORURINE YELLOW 02/14/2018 1238   APPEARANCEUR TURBID (A) 02/14/2018 1238   LABSPEC 1.014 02/14/2018 1238   PHURINE 7.0 02/14/2018 1238   GLUCOSEU NEGATIVE 02/14/2018 1238   HGBUR SMALL (A) 02/14/2018 1238   BILIRUBINUR NEGATIVE 02/14/2018 1238   KETONESUR NEGATIVE 02/14/2018 1238   PROTEINUR 100 (A) 02/14/2018 1238   UROBILINOGEN 0.2 06/30/2011 1214   NITRITE NEGATIVE 02/14/2018 1238    LEUKOCYTESUR LARGE (A) 02/14/2018 1238   Studies: No results found.  Scheduled Meds: . atorvastatin  40 mg Per Tube q1800  . chlorhexidine  15 mL Mouth Rinse BID  . cholecalciferol  400 Units Per Tube Daily  . clopidogrel  75 mg Per Tube Daily  . collagenase   Topical Daily  . famotidine  10 mg Oral Daily  . feeding supplement (NEPRO CARB STEADY)  1,000 mL Per Tube Q24H  . ferrous sulfate  300 mg Per Tube Daily  . free water  200 mL Per Tube Q8H  . insulin aspart  0-15 Units Subcutaneous Q4H  . insulin detemir  12 Units Subcutaneous Daily  . isosorbide-hydrALAZINE  0.5 tablet Per Tube TID  . mouth rinse  15 mL Mouth Rinse q12n4p  . metoprolol tartrate  25 mg Per Tube BID  . multivitamin  15 mL Per Tube Daily  . patiromer  8.4 g Oral Daily    Continuous Infusions: . sodium chloride Stopped (01/08/18 2158)     LOS: 72 days     Author:  Berle Mull, MD [facility] 02/19/2018  If 7PM-7AM, please contact night-coverage To reach On-call, see [email or web address]   If 7PM-7AM, please contact night-coverage [email or web address] Password [facility] 02/19/2018, 6:16 PM

## 2018-02-19 NOTE — Progress Notes (Signed)
NCM conts to follow along for transition of care needs.

## 2018-02-20 ENCOUNTER — Inpatient Hospital Stay (HOSPITAL_COMMUNITY): Payer: Medicare Other

## 2018-02-20 LAB — RENAL FUNCTION PANEL
Albumin: 1.9 g/dL — ABNORMAL LOW (ref 3.5–5.0)
Anion gap: 13 (ref 5–15)
BUN: 146 mg/dL — ABNORMAL HIGH (ref 8–23)
CO2: 23 mmol/L (ref 22–32)
CREATININE: 4.04 mg/dL — AB (ref 0.44–1.00)
Calcium: 8.8 mg/dL — ABNORMAL LOW (ref 8.9–10.3)
Chloride: 92 mmol/L — ABNORMAL LOW (ref 98–111)
GFR calc Af Amer: 11 mL/min — ABNORMAL LOW (ref >60)
GFR calc non Af Amer: 9 mL/min — ABNORMAL LOW (ref >60)
Glucose, Bld: 176 mg/dL — ABNORMAL HIGH (ref 70–99)
Phosphorus: 4.3 mg/dL (ref 2.5–4.6)
Potassium: 5.1 mmol/L (ref 3.5–5.1)
Sodium: 128 mmol/L — ABNORMAL LOW (ref 135–145)

## 2018-02-20 LAB — BASIC METABOLIC PANEL
Anion gap: 10 (ref 5–15)
BUN: 141 mg/dL — ABNORMAL HIGH (ref 8–23)
CO2: 23 mmol/L (ref 22–32)
CREATININE: 3.83 mg/dL — AB (ref 0.44–1.00)
Calcium: 8.4 mg/dL — ABNORMAL LOW (ref 8.9–10.3)
Chloride: 97 mmol/L — ABNORMAL LOW (ref 98–111)
GFR calc Af Amer: 11 mL/min — ABNORMAL LOW (ref >60)
GFR calc non Af Amer: 10 mL/min — ABNORMAL LOW (ref >60)
Glucose, Bld: 70 mg/dL (ref 70–99)
Potassium: 4.8 mmol/L (ref 3.5–5.1)
Sodium: 130 mmol/L — ABNORMAL LOW (ref 135–145)

## 2018-02-20 LAB — GLUCOSE, CAPILLARY
Glucose-Capillary: 105 mg/dL — ABNORMAL HIGH (ref 70–99)
Glucose-Capillary: 142 mg/dL — ABNORMAL HIGH (ref 70–99)
Glucose-Capillary: 149 mg/dL — ABNORMAL HIGH (ref 70–99)
Glucose-Capillary: 163 mg/dL — ABNORMAL HIGH (ref 70–99)
Glucose-Capillary: 166 mg/dL — ABNORMAL HIGH (ref 70–99)
Glucose-Capillary: 63 mg/dL — ABNORMAL LOW (ref 70–99)
Glucose-Capillary: 79 mg/dL (ref 70–99)

## 2018-02-20 LAB — CBC
HCT: 23.6 % — ABNORMAL LOW (ref 36.0–46.0)
Hemoglobin: 7.3 g/dL — ABNORMAL LOW (ref 12.0–15.0)
MCH: 29.6 pg (ref 26.0–34.0)
MCHC: 30.9 g/dL (ref 30.0–36.0)
MCV: 95.5 fL (ref 80.0–100.0)
NRBC: 0 % (ref 0.0–0.2)
Platelets: 249 10*3/uL (ref 150–400)
RBC: 2.47 MIL/uL — ABNORMAL LOW (ref 3.87–5.11)
RDW: 15.9 % — ABNORMAL HIGH (ref 11.5–15.5)
WBC: 8.1 10*3/uL (ref 4.0–10.5)

## 2018-02-20 MED ORDER — DEXTROSE 50 % IV SOLN
1.0000 | Freq: Once | INTRAVENOUS | Status: AC
  Administered 2018-02-20: 50 mL via INTRAVENOUS
  Filled 2018-02-20: qty 50

## 2018-02-20 MED ORDER — NEPRO/CARBSTEADY PO LIQD
1000.0000 mL | ORAL | Status: DC
  Administered 2018-02-20: 1000 mL
  Filled 2018-02-20 (×2): qty 1000

## 2018-02-20 MED ORDER — FREE WATER
30.0000 mL | Freq: Three times a day (TID) | Status: DC
  Administered 2018-02-21 – 2018-02-23 (×3): 30 mL
  Administered 2018-02-23: 60 mL
  Administered 2018-02-24 – 2018-02-25 (×5): 30 mL

## 2018-02-20 NOTE — Progress Notes (Signed)
PROGRESS NOTE  [name] LKG:[id number] DOB: Jun 12, 1927 DOA: 12/05/2017 PCP: Deland Pretty, MD  HPI/Brief Narrative  [name] is a 85 y.o. year old female with medical history significant for CAD s/p CABG, recent ischemic stroke, HTN, DM, HLD, stage 3 CKD recent admission from 10/5-10/25 for failure to thrive with profound weight loss due to swallowing problems s/p botox injection for achalasia, who presented on  12/05/2017 for admission from her SNF due to difficulty speaking which progressed tp aphasia.  Patient has had prolonged [name] course significant for peg tube placement by IR (12/11/2018) due to dysphagia from achalasia, NSTEMI, respiratory failure requiring intubation (12/2-12/6, extubated), Enterococcus UTI/Klebsiella pneumonia, anemia and now worsening renal failure. Palliative has been involved intermittently throughout [name] stay, but her son wants FULL code as he feels strongly she will not receive adequate care if she is made DNR/DNI. Currently active issue is acute kidney injury on chronic kidney disease stage III with progressive uremia and hyperkalemia. This appears to be multifactorial although current primary issue is cardiorenal hemodynamics per nephrology.  Nephrology will with the patient and multiple nephrologist has felt that the patient is not a candidate for hemodialysis. At family request we have tried transfer to tertiary care [name] ([facility]) but they are at capacity and is unable to accept the patient.  the case was reviewed with the on-call nephrologist at [name] nephrologist in a consultation capacity on 02/13/2018 who agreed with our nephrologist's assessment that the patient is not a dialysis candidate given her multiple comorbidities and age she would not tolerate this procedure and advised continue goals of care discussions.  [name] events 11/10 PEG placed poor oral intake related 11/19 General surgery consulted, found to have  nonincarcerated umbilical hernia no surgical needs and lymph node within hernia. 11/21 developed respiratory failure due to to significant volume overload from CHF exacerbation, treated aggressively with IV diuretics, NSTEMI medically managed, EF found to be 30% at that time 11/27 ENT consulted for stridor, flexible endoscopy negative 12/2 intubated for persistent pulmonary edema/hypervolemic status from heart failure flare (critical care was consulted for agonal respirations prompting transfer to ICU), with extensive IV diuresis, extubated 12/6 12/17 persistent AKI/worsening of GFR 12/18 transfer to progressive unit 1/7, nephrology deems not a dialysis candidate given age and multiple comorbidities/Poor functional status, progressive worsening kidney dysfunction, not improving with any medical management for CHF flare 1/12 advanced heart failure does not recommend any further medical management for CHF with reduced EF, and that encourages goals of care discussion 1/13 patient son like to transfer to tertiary care [name] for initiation of dialysis,  1/15 I personally discussed with [name] well as [name] both of which currently are at full capacity and unable to take the patient.  Patient is not placed on any wait list on both facilities.  Changing [name] to Nepro and increasing free water. 1/17 nephrology reconsulted for final recommendation.  Advanced heart failure team was also reconsulted. 1/18 patient appears to be more volume overloaded, fluids stopped.  Continue with free water.  Lower extremity Doppler done, ruled out DVT.  Shows anasarca. 1/19 patient's son discussed with chief of medical staff who in turn discussed the case with me.  Family wants to consider normal saline, a clearly explained that the patient is suffering from severe anasarca and it would not be in her best interest.  For hyponatremia we reduce the free water and will recheck the sodium later in  the afternoon.  02/20/2018.  [facility] again at family's request, discussed with 2 different nephrologist there and felt the transfer is not medically necessary.  Called [name] at the request of family as well, discussed with hospitalist and medical administration at [name] and they felt that the transfer is not medically necessary.  Vomiting in a.m., tube feeds were held, resumed trickle.  Worsening right pleural effusion.  Offered daughter Psychologist, forensic.  Subjective Remains agitated.  No nausea reported earlier but had an episode of vomiting later.  Also had a bowel movement.  No abdominal pain reported.  No fever no chills.  Assessment/Plan:  AKI on CKD stage III (multifactorial etiology),  Seems to be progressing to end-stage renal disease Hyponatremia/prior hypernatremia Suspect related to fluid shifts/initial pre-renal etiology  from recent infections and poor oral intake with hypotension (initially treated with IVF), CHF exacerbations, aspiration pneumonia CVA,.   Given persistent vascular congestion, anasarca/hypervolemia suspect the predominant force is cardiorenal syndrome.  Fena 0.6 on 02/15/2018, prerenal versus cardiorenal although the patient's weight is going up as well as patient has third spacing in her leg therefore consistent with cardiorenal right now. maintaining normal saturation. Appreciate nephrology assistance.  Patient is felt not a candidate for HD or PD due to her multiple comorbidities. Daily engagement with patient's son [name]. On Nepro. renal function is actually improving but patient appears to be more volume overloaded and therefore recommendation is to stop the fluid. Son is worried that the patient is suffering from SIADH.  I explained the drop in the sodium is fairly simple secondary to excessive free water and this was clearly explained to patient's family.  We will stop the free water. They were still adamant about checking ADH.   Ordered. Multiple calls with nearby hospitals on 02/13/2018, 02/14/2018 as well as 02/20/2018 regarding transfer per family request and there answer is patient's transfer is not medically necessary.  Goals of care Patient has very poor prognosis.   No progress in goals of care discussions Ethics committee consulted on 1/11 and currently considering family meeting. Son believes he is acting in mother's wishes as she has 2 brothers in the late 79s on dialysis and doing well.  Dr. Sheppard Coil recommends touching base with the daughter to ensure she knows she is also a valid decision-maker along side her brother. In the past, son has expressed that his mother should be given all the chances possible and if it were not for her he would not be where he is right now. On 02/16/2018 had a prolonged discussion with son regarding my concern regarding patient's CODE STATUS. While the son appreciate that the patient's chances of surviving a cardiac arrest is less than 5% and even if she survives her neurological recovery after such a major event is next to nil, he would still like to maintain full code, despite knowing that the patient is deemed not a candidate for dialysis and multiple nephrologist and not a candidate for inotropes by advanced heart failure team as well as repeat echocardiogram performed on 02/16/2018 shows persistently low EF with moderate MR, now with grade 2 diastolic dysfunction as well as RV dysfunction.  He mentions, " changing her CODE STATUS to DNR/DNI would mean she would not get any aggressive treatment"  Acute hypoxic respiratory failure  Acute on chronic combined diastolic and systolic CHF Moderate MR, moderate RV systolic dysfunction Moderate bilateral pleural effusion, right pleural effusion worsening anasarca She had multiple acute hypoxic respiratory events secondary to volume overload  Likely related to hypoalbuminemia in addition to CHF flare, and now worsening b/l pleural effusion  on imaging.   She has not responded well to medical management. hold off on any diuretic therapy given her poor kidney function, continue BiDil for afterload reduction, daily weights, strict I/os.   [name] has been informed multiple times by advanced heart failure team that patient is not a candidate for inotropic agents.  Patient does have edema in all 4 extremities left leg appears to be more swollen than the right and therefore her extremity venous Doppler was ordered.  Which is negative. Son wants IV fluids to be given although explained that given her worsening right pleural effusion it will increase her chances of going into respiratory failure. Similarly initial plan was to give the patient IV albumin although with worsening right pleural effusion will be too much volume and therefore would increase chances of worsening respiratory failure.  Acute metabolic encephalopathy, fluctuating between lethargy, agitation Presented primarily secondary to acute encephalopathy and aphasia thought to be due to UTI. Notes throughout the course mentions persistent metabolic encephalopathy MRI brain on admission was negative for any acute abnormality.  UTI was treated with antibiotics in November Palliative care was consulted as of care on 12/13/2017 due to persistent encephalopathy Neurology was consulted on 12/20/2017 For cognitive impairment at which time it was thought that the patient has underlying dementing process with cognitive worsening precipitated by intercurrent illness this admission.  Still fluctuating ability to converse  Occasionally says hello/good morning, is correctly answering simple math equations that her son gives her at bedside,  but then will be lethargic and will not be responding the next day. Intermittently answers questions with yes or no, able to say she is nauseous and in pain intermittently otherwise non-sensical or grunts. multifactorial etiology [name]-acquired  delirium, uremia related to AKI on CKD, oxygen requirements, suspected vascular dementia related to chronic infarcts in addition to acute small CVA here. Prior to this admission in September 2019 patient was alert and oriented x4 during most of her ADLs and very well-functioning.   EEG unremarkable.  delirium precautions  Acute on chronic anemia chronic disease, most likely CKD Baseline hemoglobin prior to [name] stay 10-11.   Hemoglobin here persistently remain around 7 to 8. Closely monitor CBCs. Goal hemoglobin greater than 8, however concern patient will not tolerate volume for blood transfusion given reduced EF and significant pulmonary edema/pleural effusion with limited ability to diurese given kidney function.  Dysphagia related to achalasia with failed botox/moderate malnutrition/failure to thrive Continue PEG tube feeding,   Vaginal bleeding secondary to uterine fibroma Hemoglobin remained stable, monitor CBC   Hypernatremia, resolved Secondary to free water deficit, resolved with increasing free water via PEG tube  ESBL Klebsiella aspiration pneumonia Enterococcus UTI Completed antibiotic course  NSTEMI, resolved Cardiology recommended medical management only.   Continue atorvastatin Plavix, Lopressor  Total 3 punctate left temporal and frontal punctate CVA Evaluated by neurology.   No work-up TTE was unremarkable for vegetation or clot.   We will continue Plavix and atorvastatin  Type 2 diabetes, controlled with renal complication. A1c 5.9 (12/2017, down from 9 on 10/2017) fasting glucose consistently in control Currently on detemir 12 units daily while on tube feeds, continue to closely monitor CBGs.  Tardive dyskinesia Diagnosed this hospitalization (01/18/2018) presumed related to reglan.  Cogentin Has been discontinued( on 02/09/18)  Lymph node within umbilical hernia, increased in size Known [facility] lymph node on previous CT evaluations, imaging from 1/10  showed increased size.  Son does not want core biopsy as he is less concern for malignancy given no other evidence of lymphadenopathy on imaging.  Umbilical hernia without incarceration Surgery has evaluated twice most recently on 1/10.  Hardened mass on exam is consistent with lymph node as mentioned above  DVT prophylaxis: Consultants: Neurology, cardiology, surgery, critical care/home, IR, gastroenterology  Code Status: Full code  Ethics. Patient with multiple comorbidities as mentioned above, not a candidate for any dialysis treatment per nephrology or any other aggressive intervention from cardiology.  Son wants to continue full code, wants to pursue options for hemodialysis, requesting surgical procedure-PD catheter placement which would be deemed high risk in current set up. (Estimated Risk Probability for Perioperative Myocardial Infarction or Cardiac Arrest 15.56 % Lyndel Safe PK et.al).  Given his severely poor prognosis I recommended family to consider her changing CODE STATUS to DNR which is currently full code.  Son understand that the patient's chances of survival of a cardiac arrest is less than 5% and meaningful recovery is negligible and still would like to maintain full code.  Sons rationality is that she may not get aggressive care if she is DNR/DNI and says that "in his business 5% is big."  Patient is at very high risk for major event primary due to her volume overload. Son has been informed that 2 tertiary care facilities have felt that the patient's transfer is not medically necessary. Ethics committee is currently involved.  Offered patient's son Audiological scientist consultation on 02/16/2018, offered the daughter ethics committee consultation on 02/20/2018.  Family Communication:   Discussed with daughter at bedside Discussed with son on a daily basis.  Disposition Plan very poor prognosis  Objective: Vitals:   02/20/18 0532 02/20/18 0721 02/20/18 1632 02/20/18 1956  BP: 127/63  135/71 128/63 133/64  Pulse: 81 79 70 77  Resp: (!) 24 18 17  (!) 25  Temp:  97.6 F (36.4 C) (!) 97.5 F (36.4 C) 97.9 F (36.6 C)  TempSrc:  Oral Oral Oral  SpO2: 96% 98% 99% 100%  Weight:      Height:        Intake/Output Summary (Last 24 hours) at 02/20/2018 2039 Last data filed at 02/20/2018 1815 Gross per 24 hour  Intake 678 ml  Output 550 ml  Net 128 ml   Filed Weights   02/14/18 0550 02/16/18 0500 02/18/18 0500  Weight: 75.2 kg 78.1 kg 77.7 kg    Exam:  Constitutional: Elderly female, chronically ill-appearing, mild distress Eyes: EOMI, anicteric Cardiovascular: RRR no murmur, 2+ pitting edema of bilateral lower and upper extremities ( ankle to thigh) Respiratory: Normal respiratory effort on room air,  difficult to auscultate given positioning in bed Abdomen: Soft,non-tender, distended, umbilical hernia present with non-mobile ,large hardened lymph node nontender with deep palpation, PEG tube in place with no surrounding erythema Skin: No rash ulcers, or lesions. Without skin tenting  Neurologic: Grunting, answering questions with yes and no does not follow commands Data Reviewed: CBC: Recent Labs  Lab 02/15/18 0451 02/16/18 0959 02/17/18 0430 02/18/18 0434 02/19/18 0636 02/20/18 0420  WBC 7.9 8.6 8.1 8.3 7.3 8.1  NEUTROABS 6.1  --   --   --   --   --   HGB 7.1* 7.5* 7.3* 7.3* 7.3* 7.3*  HCT 23.5* 24.5* 24.0* 23.5* 23.6* 23.6*  MCV 96.7 97.2 95.2 97.1 98.3 95.5  PLT 213 253 229 253 242 010   Basic Metabolic Panel: Recent Labs  Lab 02/15/18 0451 02/16/18  0500 02/17/18 0430 02/18/18 0434 02/19/18 0636 02/19/18 1800 02/20/18 0420  NA 131* 132* 131* 131* 128* 128* 128*  K 5.7* 5.5* 5.2* 5.1 5.0 5.1 5.1  CL 95* 96* 93* 94* 92* 91* 92*  CO2 26 24 24 25 24 23 23   GLUCOSE 113* 109* 129* 135* 133* 181* 176*  BUN 139* 137* 135* 136* 140* 145* 146*  CREATININE 4.14* 4.09* 3.79* 3.90* 4.00* 4.02* 4.04*  CALCIUM 8.5* 8.4* 8.3* 8.4* 8.5* 8.5* 8.8*  MG 2.4   --   --   --   --   --   --   PHOS  --  5.9* 5.1* 4.7* 4.5  --  4.3   GFR: Estimated Creatinine Clearance: 9.5 mL/min (A) (by C-G formula based on SCr of 4.04 mg/dL (H)). Liver Function Tests: Recent Labs  Lab 02/15/18 0451 02/16/18 0500 02/17/18 0430 02/18/18 0434 02/19/18 0636 02/20/18 0420  AST 55*  --   --   --   --   --   ALT 85*  --   --   --   --   --   ALKPHOS 78  --   --   --   --   --   BILITOT 0.7  --   --   --   --   --   PROT 6.2*  --   --   --   --   --   ALBUMIN 1.8* 1.9* 1.9* 1.9* 1.8* 1.9*   No results for input(s): LIPASE, AMYLASE in the last 168 hours. No results for input(s): AMMONIA in the last 168 hours. Coagulation Profile: No results for input(s): INR, PROTIME in the last 168 hours. Cardiac Enzymes: No results for input(s): CKTOTAL, CKMB, CKMBINDEX, TROPONINI in the last 168 hours. BNP (last 3 results) No results for input(s): PROBNP in the last 8760 hours. HbA1C: No results for input(s): HGBA1C in the last 72 hours. CBG: Recent Labs  Lab 02/20/18 0420 02/20/18 0722 02/20/18 1205 02/20/18 1633 02/20/18 1955  GLUCAP 166* 163* 149* 79 63*   Lipid Profile: No results for input(s): CHOL, HDL, LDLCALC, TRIG, CHOLHDL, LDLDIRECT in the last 72 hours. Thyroid Function Tests: No results for input(s): TSH, T4TOTAL, FREET4, T3FREE, THYROIDAB in the last 72 hours. Anemia Panel: No results for input(s): VITAMINB12, FOLATE, FERRITIN, TIBC, IRON, RETICCTPCT in the last 72 hours. Urine analysis:    Component Value Date/Time   COLORURINE YELLOW 02/14/2018 1238   APPEARANCEUR TURBID (A) 02/14/2018 1238   LABSPEC 1.014 02/14/2018 1238   PHURINE 7.0 02/14/2018 1238   GLUCOSEU NEGATIVE 02/14/2018 1238   HGBUR SMALL (A) 02/14/2018 1238   BILIRUBINUR NEGATIVE 02/14/2018 1238   KETONESUR NEGATIVE 02/14/2018 1238   PROTEINUR 100 (A) 02/14/2018 1238   UROBILINOGEN 0.2 06/30/2011 1214   NITRITE NEGATIVE 02/14/2018 1238   LEUKOCYTESUR LARGE (A) 02/14/2018 1238    Studies: Dg Chest Port 1 View  Result Date: 02/20/2018 CLINICAL DATA:  85 year old female with vomiting. Query aspiration. Renal failure, status post NSTEMI last month, heart failure. Recent infections. Shortness of breath. EXAM: PORTABLE CHEST 1 VIEW COMPARISON:  0707 hours earlier today. FINDINGS: Portable AP semi upright view at 1007 hours. Stable right PICC line. Stable cardiomegaly and mediastinal contours. Stable veiling and confluent bibasilar opacity from earlier today, with some pleural fluid tracking in the right minor fissure. No new areas of pulmonary opacity. No pneumothorax. Stable upper lobe vascularity. A proximal right humerus deformity is chronic and stable. Paucity of bowel gas in the  upper abdomen. IMPRESSION: Stable from earlier today. Bilateral pleural effusions and lower lobe collapse or consolidation with no new cardiopulmonary abnormality. Electronically Signed   By: Genevie Ann M.D.   On: 02/20/2018 10:46   Dg Chest Port 1 View  Result Date: 02/20/2018 CLINICAL DATA:  85 year old female with renal failure, status post NSTEMI last month, heart failure. Recent infections. Shortness of breath today. EXAM: PORTABLE CHEST 1 VIEW COMPARISON:  CT Abdomen and Pelvis 02/09/2018. Portable chest 02/07/2018 and earlier. FINDINGS: Portable AP semi upright view at 0707 hours. Stable right PICC line. Stable cardiomegaly and mediastinal contours. Prior CABG. Bibasilar veiling and confluent pulmonary opacity persists, mildly increased on the right since 02/08/2008 and fluid now tracking in the right minor fissure. Apical pulmonary vascularity appears fairly normal. No pneumothorax. Visualized tracheal air column is within normal limits. No acute osseous abnormality identified. IMPRESSION: Cardiomegaly with bilateral pleural effusions with bibasilar atelectasis or consolidation. Right pleural effusion appears mildly increased since 02/07/2018, now moderate overall. Electronically Signed   By: Genevie Ann  M.D.   On: 02/20/2018 07:26   Dg Abd Portable 1v  Result Date: 02/20/2018 CLINICAL DATA:  85 year old female with vomiting. Query aspiration. Renal failure, status post NSTEMI last month, heart failure. Recent infections. Shortness of breath. EXAM: PORTABLE ABDOMEN - 1 VIEW COMPARISON:  CT Abdomen and Pelvis 02/09/2018 and earlier. FINDINGS: Portable AP view at 1007 hours. Gastrostomy tube redemonstrated. The mildly increased gas in the stomach compared to the prior CT, but normal and nonobstructed bowel-gas pattern elsewhere. Aortic and iliofemoral calcified atherosclerosis. Mild-to-moderate degenerative appearing dextroconvex lumbar spine curvature. Multilevel disc and endplate degeneration. No acute osseous abnormality identified. IMPRESSION: Stable gastrostomy tube and bowel-gas pattern within normal limits. Electronically Signed   By: Genevie Ann M.D.   On: 02/20/2018 10:48    Scheduled Meds: . atorvastatin  40 mg Per Tube q1800  . chlorhexidine  15 mL Mouth Rinse BID  . cholecalciferol  400 Units Per Tube Daily  . clopidogrel  75 mg Per Tube Daily  . collagenase   Topical Daily  . famotidine  10 mg Oral Daily  . feeding supplement (NEPRO CARB STEADY)  1,000 mL Per Tube Q24H  . ferrous sulfate  300 mg Per Tube Daily  . insulin aspart  0-15 Units Subcutaneous Q4H  . insulin detemir  12 Units Subcutaneous Daily  . isosorbide-hydrALAZINE  0.5 tablet Per Tube TID  . mouth rinse  15 mL Mouth Rinse q12n4p  . metoprolol tartrate  25 mg Per Tube BID  . multivitamin  15 mL Per Tube Daily  . patiromer  8.4 g Oral Daily    Continuous Infusions: . sodium chloride Stopped (01/08/18 2158)     LOS: 73 days     Author:  Berle Mull, MD [facility] 02/20/2018  If 7PM-7AM, please contact night-coverage To reach On-call, see [email or web address]   If 7PM-7AM, please contact night-coverage [email or web address] Password [facility] 02/20/2018, 8:39 PM

## 2018-02-20 NOTE — Progress Notes (Addendum)
RN in pt's room around 0900 assessing pt and administering scheduled meds. Pt yelling out and stating, "let me die". Asked pt if she was in pain she said no, asked pt if she felt bad pt replied yes, and continued to yell out with facial grimacing present. Asked daughter if she would like pt to have pain meds, daughter said, "no she just needs to get some sleep." Daughter suggested tylenol to help pt sleep and RN explained that tylenol would not make her sleepy, but would help if pt was in pain. Daughter agreed to tylenol. RN administered PRN tylenol and scheduled meds, shortly after pt began vomiting a large amount. Daughter then requested we give pt phenergan, and RN offered phenergan or zofran. Daughter said zofran would be fine for now. MD made aware, ordered to hold tube feeds for now. Pt bathed and given zofran. Will recheck and continue to monitor.   Riley Kill RN

## 2018-02-20 NOTE — Progress Notes (Signed)
RN contacted pt's son in regards to pt becoming very agitated, screaming, facial grimacing, and saying things such as "let me die." Pt is also throwing her arms and head against rail, have tried to pad with pillows.. Pt's son replied with "this is due to her uremia, and phenergan will calm her down but I know the doctors have wanted to stop that medication. Give it if you can." Pt's son also stated, "This is not good, we have to get her transferred to another hospital is all I can say." RN assured son we would do all we could, and RN paged MD.  Riley Kill RN

## 2018-02-21 DIAGNOSIS — I5043 Acute on chronic combined systolic (congestive) and diastolic (congestive) heart failure: Secondary | ICD-10-CM

## 2018-02-21 DIAGNOSIS — R601 Generalized edema: Secondary | ICD-10-CM

## 2018-02-21 DIAGNOSIS — G9341 Metabolic encephalopathy: Secondary | ICD-10-CM

## 2018-02-21 LAB — GLUCOSE, CAPILLARY
Glucose-Capillary: 88 mg/dL (ref 70–99)
Glucose-Capillary: 80 mg/dL (ref 70–99)
Glucose-Capillary: 95 mg/dL (ref 70–99)
Glucose-Capillary: 85 mg/dL (ref 70–99)
Glucose-Capillary: 76 mg/dL (ref 70–99)

## 2018-02-21 LAB — RENAL FUNCTION PANEL
Albumin: 1.9 g/dL — ABNORMAL LOW (ref 3.5–5.0)
Anion gap: 11 (ref 5–15)
BUN: 154 mg/dL — ABNORMAL HIGH (ref 8–23)
CALCIUM: 9 mg/dL (ref 8.9–10.3)
CO2: 24 mmol/L (ref 22–32)
Chloride: 93 mmol/L — ABNORMAL LOW (ref 98–111)
Creatinine, Ser: 4.06 mg/dL — ABNORMAL HIGH (ref 0.44–1.00)
GFR calc Af Amer: 11 mL/min — ABNORMAL LOW (ref >60)
GFR, EST NON AFRICAN AMERICAN: 9 mL/min — AB (ref >60)
Glucose, Bld: 86 mg/dL (ref 70–99)
Phosphorus: 4.8 mg/dL — ABNORMAL HIGH (ref 2.5–4.6)
Potassium: 5.5 mmol/L — ABNORMAL HIGH (ref 3.5–5.1)
Sodium: 128 mmol/L — ABNORMAL LOW (ref 135–145)

## 2018-02-21 LAB — CBC
HCT: 26 % — ABNORMAL LOW (ref 36.0–46.0)
Hemoglobin: 8 g/dL — ABNORMAL LOW (ref 12.0–15.0)
MCH: 29.3 pg (ref 26.0–34.0)
MCHC: 30.8 g/dL (ref 30.0–36.0)
MCV: 95.2 fL (ref 80.0–100.0)
NRBC: 0 % (ref 0.0–0.2)
Platelets: 207 10*3/uL (ref 150–400)
RBC: 2.73 MIL/uL — ABNORMAL LOW (ref 3.87–5.11)
RDW: 16.1 % — ABNORMAL HIGH (ref 11.5–15.5)
WBC: 5.5 10*3/uL (ref 4.0–10.5)

## 2018-02-21 MED ORDER — SODIUM CHLORIDE 0.9 % IV SOLN
INTRAVENOUS | Status: DC
  Administered 2018-02-21: 50 mL/h via INTRAVENOUS
  Administered 2018-02-22: 03:00:00 via INTRAVENOUS

## 2018-02-21 NOTE — Progress Notes (Addendum)
Nutrition Follow Up  DOCUMENTATION CODES:   Non-severe (moderate) malnutrition in context of chronic illness, Underweight  INTERVENTION:    As medically appropriate:  Restart Nepro at 10 ml/hr and increase by 10 ml every 12 hours to goal rate of 40 ml/hr  Provides 1728 kcals, 77 gm protein, 698 ml free water daily   Continue liquid MVI daily via tube  NUTRITION DIAGNOSIS:   Moderate Malnutrition related to chronic illness(dysphagia 2/2 to achalasia) as evidenced by energy intake < or equal to 75% for > or equal to 1 month, mild fat depletion, moderate fat depletion, mild muscle depletion, moderate muscle depletion, energy intake < or equal to 50% for > or equal to 1 month, ongoing  GOAL:   Patient will meet greater than or equal to 90% of their needs, currently unmet  MONITOR:   TF tolerance, Labs, Skin, Weight trends, I & O's  ASSESSMENT:   Honest Safranek is a 85 y.o. female with medical history significant for DM2, HTN, HLD, CAD s/p CABG, h/o CVA, CKD3, who was recently diagnosed with achalasia after a 50 lb weight loss over the past year. She was treated with botox earlier in October and sent to SNF with the hopes she would be able to swallow her food. She has not done well since that time, has only been able to drink small amounts of milk. Son is a Management consultant in [place] and has been visiting. Yesterday he noticed she was stuttering, having difficulty getting words out, and progressed until she seemed to have total aphasia last night. She does report having dysuria and some lower abd pain. No N/V. Has a good appetite but avoids food because of the obstruction. Megace and remeron have not helped. Process of being worked up for G tube has been initiated and pt reportedly has appt with [facility] tomorrow morning. Son feels like her weight loss and FTT could be reversible if she gets a G tube as she was reportedly very functional a month ago, walking with a walker and performing  all of her ADLs.   11/7 - per IR notes, imaging revealed moderate sized hiatal hernia and colon in upper abdomen, recommending evaluating anatomy with fluoroscopy 11/8 - PICC placed 11/10 - TPN started, IR placed G-tube 11/11 - G-tube cleared for use by RI, TF initiated 11/13 - TPN d/c 11/20 - transitioned to bolus feedings 11/23 - s/p BSE, recommend continued NPO due to AMS, emesis with bolus feedings, rapid response called 11/25 - rapid response, tube feeds held and restarted in the afternoon at 20 ml/hr 11/27 - per MD, tube feeds at 35 ml/hr during the daytime and 30 ml/hr overnight 12/01 - rapid response for increased WOB 12/02 - emesis, TF turned off, intubated 12/06 - extubated, BiPAP at night 12/30 Vital AF 1.2 discontinued, Jevity 1.2 started >> goal rate 55 ml/hr 12/31 Jevity 1.2 decreased to 40 ml/hr per pt's son request   Pt discussed in progression rounds this AM. Pt had episode of vomiting after TF increase from 10 ml/h to 20 ml/h this AM. Spoke with Clarene Critchley, RN; TF (Nepro formula) is currently off via PEG tube.  Free water flushes at 30 ml every 8 hours. Labs reviewed. Na 128 (L). K 5.5 (H). CBG's 105-88-80.  Diet Order:   Diet Order            Diet NPO time specified  Diet effective now             EDUCATION NEEDS:  Not appropriate for education at this time  Skin:  Skin Assessment: Skin Integrity Issues: Skin Integrity Issues:: Stage II Stage II: left buttocks, sacrum Other: partial thickness wounds to bilateral buttocks (likely from adhesive)  Last BM:  1/21    Intake/Output Summary (Last 24 hours) at 02/21/2018 1212 Last data filed at 02/21/2018 0800 Gross per 24 hour  Intake 60 ml  Output 500 ml  Net -440 ml   Height:   Ht Readings from Last 1 Encounters:  12/20/17 5\' 3"  (1.6 m)   Weight:   Wt Readings from Last 1 Encounters:  02/21/18 80.6 kg   Ideal Body Weight:  52.3 kg  BMI:  Body mass index is 31.48 kg/m.highly skewed given  fluid  Estimated Nutritional Needs:  Kcal:  1450-1650  Protein:  75-90 gm  Fluid:  >/= 1.5 L  Arthur Holms, RD, LDN Pager #: [phone] After-Hours Pager #: [phone]

## 2018-02-21 NOTE — Progress Notes (Addendum)
Patient experienced an episode of nausea and vomiting after her tube feeding increased from 20ml to 52ml per hour. She was given phenergan prior to increasing patient's feeding per daughter's request. I decreased the feeding to 75ml and informed the patient's daughter who had stepped off the unit.

## 2018-02-21 NOTE — Progress Notes (Signed)
PROGRESS NOTE  [name] MWN:[id number] DOB: 1927-11-27 DOA: 12/05/2017 PCP: Deland Pretty, MD  Brief History   85 year old woman admitted 11/9; stented with failure to thrive secondary to achalasia, acute dysarthria.  Hospitalization complicated and prolonged, patient underwent PEG tube placement for dysphagia secondary to achalasia, suffered NSTEMI, suffered acute respiratory failure requiring intubation December 2-December 6, was treated for UTI, pneumonia.  Treated for heart failure with poor diuresis with Lasix, seen by cardiology and reportedly advanced heart failure recommended no aggressive measures.  She subsequently developed acute kidney injury and was seen by nephrology and dialysis was not offered.  Family requested transfer to tertiary care center but both [facility] refused.  Family continues to desire full aggressive care including hemodialysis.  At this point major issue is acute kidney injury without improvement, metabolic encephalopathy.  A & P  Acute kidney injury superimposed on CKD stage III, modest hyperkalemia, complicated by systolic CHF, poor nutrition --No significant improvement.  Continues have significant volume overload/anasarca. --IVF given no TF today; continue patiromer --Nephrology last evaluation recommended no hemodialysis. --Son Dr. Mayer Masker continues to request full aggressive care and hemodialysis. --[facility] refused transfer.  Goals of care --Prognosis guarded with acute kidney injury, worsening BUN, mild hyperkalemia, multiple other comorbidities. --Nephrology has not offered hemodialysis --Son continues to request full aggressive care and hemodialysis  Acute metabolic encephalopathy --Patient unable to offer any history or participate in exam at this point.  Moaning and crying out.  Acute on chronic combined systolic, diastolic CHF, moderate MR, moderate RV systolic dysfunction, bilateral pleural effusions, anasarca,  hypoalbuminemia.  Status post multiple acute hypoxic respiratory events secondary to volume overload.  Per chart has not responded well to medical management with diuretic therapy.  I discussed with previous attending physician Dr. Posey Pronto who advised against diuretic therapy based on his care of the patient.  Per chart, advanced heart failure team felt patient not a candidate for inotropic agents. --Per chart there appears to be little to offer here.  Dysphagia secondary to achalasia, failed Botox, moderate malnutrition --Patient vomited tube feeds last evening, tube feeds temporarily on hold, will treat with small amount of IV fluids and consider reinitiating tube feeds tomorrow.  Diabetes mellitus type 2, A1c 5.9 --Hold Levemir while tube feeds are on hold.  Continue sliding scale insulin.  Acute on chronic anemia, anemia of chronic disease, CKD --Hemoglobin stable at 8.0  Tardive dyskinesia diagnosed 12/19 during this hospitalization, presumed secondary to Reglan.  Cogentin stopped 1/10.  Status post treatment for ESBL Klebsiella aspiration pneumonia, enterococcus UTI  Status post NSTEMI, cardiology recommended medical management only. --Continue Lipitor, Plavix, Lopressor  Left temporal and frontal punctate CVA, seen by neurology --Continue Plavix and Lipitor  Lymph node within umbilical hernia, son declined core biopsy.  General surgery has evaluated, no further recommendations at this time.   Complex case, discussed in detail with son, I have discussed with ethics, will pursue further conversation with nephrology based on son's request.  Patient appears to have a guarded prognosis and is quite ill.  DVT prophylaxis: TED hose Code Status: Full Family Communication:  Disposition Plan: pending    [name], MD  [facility] Direct contact: see [email or web address]  7PM-7AM contact night coverage as above 02/21/2018, 4:20 PM  LOS: 74 days   Consultants   . Neurology . Cardiology . General surgery . Critical care . Gastroenterology . Interventional radiology . Nephrology  Procedures  . Echo 1/17  Study Result   Result  status: Final result                              *[facility]*                         [address] [place], [place] [address]                            [phone]  ------------------------------------------------------------------- Transthoracic Echocardiography  Patient:    [name], [name] MR #:       [id number] Study Date: 02/16/2018 Gender:     F Age:        64 Height:     160 cm Weight:     78.1 kg BSA:        1.89 m^2 Pt. Status: Room:       [id number]   ATTENDING    Clyde Lundborg, Diamantina Providence M  REFERRING    Lavina Hamman  PERFORMING   [facility], Inpatient  SONOGRAPHER  [place], RDCS  ADMITTING    Janora Norlander  cc:  ------------------------------------------------------------------- LV EF: 30%  ------------------------------------------------------------------- Indications:      CHF - 428.0.  ------------------------------------------------------------------- History:   PMH:   Coronary artery disease.  Stroke.  Risk factors: Hypertension. Diabetes mellitus.  ------------------------------------------------------------------- Study Conclusions  - Left ventricle: The cavity size was normal. Wall thickness was   normal. The estimated ejection fraction was 30%. Diffuse   hypokinesis. Features are consistent with a pseudonormal left   ventricular filling pattern, with concomitant abnormal relaxation   and increased filling pressure (grade 2 diastolic dysfunction). - Aortic valve: There was no stenosis. - Mitral valve: Mildly calcified annulus with restricted posterior   leaflet. There was moderate regurgitation. - Left atrium: The atrium was mildly dilated. - Right ventricle: The  cavity size was mildly dilated. Systolic   function was moderately reduced. - Right atrium: The atrium was mildly dilated. - Systemic veins: IVC not visualized. - Pericardium, extracardiac: There was a pleural effusion.  Impressions:  - Technically difficult study with poor acoustic windows. Normal LV   size with diffuse hypokinesis, EF 30%. Moderate diastolic   dysfunction. Mildly dilated RV with moderately decreased systolic   function. Moderate mitral regurgitation.    Antibiotics  .   Interval History/Subjective  Moaning, speech not clear, unable to offer any history.  Daughter at bedside reports patient had a very good night and slept very well until she became nauseous this morning.  Patient per RN did have an episode of significant vomiting approximately 10 PM last night and tube feeds were stopped at that time.  Objective   Vitals:  Vitals:   02/21/18 0716 02/21/18 1143  BP: 131/71 121/63  Pulse: 79 70  Resp: 16 (!) 27  Temp: 97.6 F (36.4 C) (!) 97.5 F (36.4 C)  SpO2: 100% 100%    Exam:  Constitutional:  . Appears anxious, ill, mildly agitated, uncomfortable, moaning Eyes:  . pupils and irises appear normal ENMT:  . Not assess hearing.  Lips appear grossly unremarkable.  Limited he  is tongue appears unremarkable. Neck:  . JVD noted Respiratory:  . CTA bilaterally, no w/r/r.  . Respiratory effort mildly increased. Cardiovascular:  . RRR, no m/r/g . 3+ anasarca abdomen and lower extremities. Abdomen:  . PEG tube in place.  Abdomen edematous, soft, nontender, nondistended. Musculoskeletal:  . Digits/nails right upper extremity unremarkable.  Left hand and soft met. Neurologic:  . Difficult to assess, grossly nonfocal Psychiatric:  . Mental status o Mood, affect unable to assess at this point given anxiety and agitation . judgment and insight cannot be assessed at this point   I have personally reviewed the following:   Today's Data  . Urine  output 300.  +12.8 L since admission.  Lab Data  . Potassium 5.5, BUN 154 and increasing.  Creatinine 4.06 from stable over the last several days.  Anion gap within normal limits.  Phosphorus 4.8.  Hemoglobin stable 8.0.  Micro Data  .   Imaging  . Abdominal x-ray 1/21 shows stable gastrostomy tube and bowel gas pattern within normal limits. . Chest x-ray 1/21 showed right greater than left pleural effusion.  Cardiology Data  .   Other Data  .   Scheduled Meds: . atorvastatin  40 mg Per Tube q1800  . chlorhexidine  15 mL Mouth Rinse BID  . cholecalciferol  400 Units Per Tube Daily  . clopidogrel  75 mg Per Tube Daily  . collagenase   Topical Daily  . famotidine  10 mg Oral Daily  . feeding supplement (NEPRO CARB STEADY)  1,000 mL Per Tube Q24H  . ferrous sulfate  300 mg Per Tube Daily  . free water  30 mL Per Tube Q8H  . insulin aspart  0-15 Units Subcutaneous Q4H  . insulin detemir  12 Units Subcutaneous Daily  . isosorbide-hydrALAZINE  0.5 tablet Per Tube TID  . mouth rinse  15 mL Mouth Rinse q12n4p  . metoprolol tartrate  25 mg Per Tube BID  . multivitamin  15 mL Per Tube Daily  . patiromer  8.4 g Oral Daily   Continuous Infusions: . sodium chloride Stopped (01/08/18 2158)  . sodium chloride      Principal Problem:   AKI (acute kidney injury) ([place]) Active Problems:   Diabetes mellitus ([place])   Hyperlipidemia   Essential hypertension   Coronary atherosclerosis   Pleural effusion   Stroke (cerebrum) (H[facility])   Stage 3 chronic kidney disease (HCC)   Enlarged lymph node   Dysarthria   Adult failure to thrive   UTI (urinary tract infection)   Palliative care by specialist   NSTEMI (non-ST elevated myocardial infarction) ([place])   Acute on chronic respiratory failure with hypoxia (HCC)   CKD (chronic kidney disease) stage 5, GFR less than 15 ml/min (HCC)   Acute metabolic encephalopathy   Acute on chronic combined systolic and diastolic CHF (congestive heart  failure) ([place])   Anasarca   LOS: 74 days      Time spent 70 minutes, greater than 50% in counseling, discussion with son and daughter, as well as Social research officer, government and ethics.

## 2018-02-22 LAB — RENAL FUNCTION PANEL
Albumin: 1.8 g/dL — ABNORMAL LOW (ref 3.5–5.0)
Anion gap: 14 (ref 5–15)
BUN: 155 mg/dL — ABNORMAL HIGH (ref 8–23)
CO2: 20 mmol/L — ABNORMAL LOW (ref 22–32)
Calcium: 8.9 mg/dL (ref 8.9–10.3)
Chloride: 93 mmol/L — ABNORMAL LOW (ref 98–111)
Creatinine, Ser: 4.26 mg/dL — ABNORMAL HIGH (ref 0.44–1.00)
GFR calc Af Amer: 10 mL/min — ABNORMAL LOW (ref >60)
GFR calc non Af Amer: 9 mL/min — ABNORMAL LOW (ref >60)
Glucose, Bld: 95 mg/dL (ref 70–99)
Phosphorus: 5.5 mg/dL — ABNORMAL HIGH (ref 2.5–4.6)
Potassium: 4.9 mmol/L (ref 3.5–5.1)
Sodium: 127 mmol/L — ABNORMAL LOW (ref 135–145)

## 2018-02-22 LAB — GLUCOSE, CAPILLARY
Glucose-Capillary: 104 mg/dL — ABNORMAL HIGH (ref 70–99)
Glucose-Capillary: 106 mg/dL — ABNORMAL HIGH (ref 70–99)
Glucose-Capillary: 68 mg/dL — ABNORMAL LOW (ref 70–99)
Glucose-Capillary: 69 mg/dL — ABNORMAL LOW (ref 70–99)
Glucose-Capillary: 71 mg/dL (ref 70–99)
Glucose-Capillary: 72 mg/dL (ref 70–99)
Glucose-Capillary: 98 mg/dL (ref 70–99)
Glucose-Capillary: 98 mg/dL (ref 70–99)
Glucose-Capillary: 98 mg/dL (ref 70–99)

## 2018-02-22 MED ORDER — GLUCOSE 4 G PO CHEW
CHEWABLE_TABLET | ORAL | Status: AC
  Administered 2018-02-22: 4 g
  Filled 2018-02-22: qty 1

## 2018-02-22 MED ORDER — DEXTROSE 50 % IV SOLN
INTRAVENOUS | Status: AC
  Filled 2018-02-22: qty 50

## 2018-02-22 MED ORDER — DEXTROSE 50 % IV SOLN
25.0000 mL | Freq: Once | INTRAVENOUS | Status: AC
  Administered 2018-02-22: 25 mL via INTRAVENOUS

## 2018-02-22 MED ORDER — DEXTROSE-NACL 5-0.45 % IV SOLN
INTRAVENOUS | Status: DC
  Administered 2018-02-22 – 2018-02-23 (×2): via INTRAVENOUS

## 2018-02-22 NOTE — Progress Notes (Signed)
Hypoglycemic Event  CBG:68  Treatment: 25g Dextrose  Symptoms: none  Follow-up CBG: Time:0900 CBG Result:101 Possible Reasons for Event: TF HoLD Comments/MD notified:[place]- will start D51/2NS   Marijean Niemann

## 2018-02-22 NOTE — Progress Notes (Signed)
Patient's blood glucose was checked at 0000, result was 69.  Glucose tablet - 4gm - was crushed and administered through PEG tube.  Blood glucose was re-tested.  New result was 72.   Patient has been sleeping and quiet this shift.  Patient briefly woke up when this RN performed mouth care.

## 2018-02-22 NOTE — Progress Notes (Addendum)
PROGRESS NOTE  [name] UVO:[id number] DOB: 06-27-1927 DOA: 12/05/2017 PCP: Deland Pretty, MD  Brief History   85 year old woman admitted 11/9; presented with stuttering, progressive to total aphasia, admitted for dysarthria, UTI, achalasia with 50 pound weight loss. Patient underwent PEG tube placement for dysphagia secondary to achalasia.  Later hospitalization suffered NSTEMI, found to have acute systolic and diastolic CHF, suffered acute respiratory failure requiring intubation December 2-December 6, was treated for UTI, pneumonia.  Suffered new cerebral infarcts and was seen by neurology. Treated for heart failure with poor diuresis with Lasix, seen by cardiology and reportedly advanced heart failure recommended no aggressive measures.  She subsequently developed acute kidney injury and was seen by nephrology and dialysis was not offered.  Family requested transfer to tertiary care center but both [place] refused.  Family continues to desire full aggressive care including hemodialysis.  At this point major issue is acute kidney injury without improvement, metabolic encephalopathy.  A & P  Acute kidney injury superimposed on CKD stage III, complicated by systolic CHF, poor nutrition.  At this point nephrology is recommended against hemodialysis.  [facility] and [facility] refused transfer earlier in hospitalization. --Renal function continues to slowly worsen.  Urine output poor.  No change in significant volume overload/anasarca.   --Potassium within normal limits today. --Discussed in detail with son, will continue IV fluids at low rate --Will discuss with nephrology and ethics 1/24 --Son Dr. Mayer Masker requests full aggressive care and hemodialysis. --[facility] and [facility] refused transfer.  Goals of care --Prognosis remains guarded with worsening renal function/acute renal failure, poor urine output and reported unresponsiveness to Lasix. --Nephrology has not offered  hemodialysis --Son continues to request full aggressive care and hemodialysis --Plan as above  Acute metabolic encephalopathy.  Per neurology consult 11/20: "Imaging of the brain shows significant white matter disease with multiple infarcts and diffuse cerebral atrophy, including hippocampal atrophy. These findings in conjunction with her severe cognitive impairment on exam are suggestive of a possible mixed underlying dementing process consisting of a vascular dementia with Alzheimer's disease, with cognitive worsening precipitated by intercurrent illness this admission." --Somewhat better today, able to follow simple commands.  Appears calm and mostly comfortable.  Acute on chronic combined systolic, diastolic CHF, moderate MR, moderate RV systolic dysfunction, bilateral pleural effusions, anasarca, hypoalbuminemia.  Status post multiple acute hypoxic respiratory events secondary to volume overload.  Per chart has not responded well to medical management with diuretic therapy.  I discussed with previous attending physician Dr. Posey Pronto who advised against diuretic therapy based on his care of the patient.  Per chart, advanced heart failure team felt patient not a candidate for inotropic agents. --Per chart there appears to be little to offer here.  Dysphagia secondary to achalasia, failed Botox, moderate malnutrition --Vomited tube feeds several days in a row, not clear whether intolerant to tube feeds or secondary to uremia.  Discussed with son in detail, will hold tube feeds again today and consider restarting tomorrow.  Continue IV fluids.  Diabetes mellitus type 2, A1c 5.9 --Continue to hold Levemir while tube feeds are on hold.   --Start dextrose infusion given hypoglycemia secondary to no oral intake.  Acute on chronic anemia, anemia of chronic disease, CKD --Hemoglobin has been stable.  Tardive dyskinesia diagnosed 12/19 during this hospitalization, presumed secondary to Reglan.  Cogentin  stopped 1/10.  Status post treatment for ESBL Klebsiella aspiration pneumonia, enterococcus UTI  Status post NSTEMI, cardiology recommended medical management only. --Continue Lipitor, Plavix, Lopressor  Left temporal and frontal punctate CVA, seen by neurology --Continue Plavix and Lipitor  Lymph node within umbilical hernia, son declined core biopsy.  General surgery has evaluated, no further recommendations at this time.   Complex case, little to offer at the present moment as nephrology has felt patient not a dialysis candidate.  Prognosis remains guarded, meeting planned 1/24 with ethics and nephrology to discuss son's request for hemodialysis and further goals of care.  DVT prophylaxis: TED hose Code Status: Full Family Communication:  Disposition Plan: pending    [name], MD  [facility] Direct contact: see [email or web address]  7PM-7AM contact night coverage as above 02/22/2018, 10:00 AM  LOS: 75 days   Consultants  . Neurology . Cardiology . General surgery . Critical care . Gastroenterology . Interventional radiology . Nephrology . PMT  Hospital events 11/10 PEG placed poor oral intake related 11/19 General surgery consulted, found to have nonincarcerated umbilical hernia no surgical needs and lymph node within hernia. 11/21 developed respiratory failure due to to significant volume overload from CHF exacerbation, treated aggressively with IV diuretics, NSTEMI medically managed, EF found to be 30% at that time 11/27 ENT consulted for stridor, flexible endoscopy negative 12/2 intubated for persistent pulmonary edema/hypervolemic status from heart failure flare (critical care was consulted for agonal respirations prompting transfer to ICU), with extensive IV diuresis, extubated 12/6 12/4 MRI showed new infarcts and patient was seen by neurology. 12/17 persistent AKI/worsening of GFR 12/18 transfer to progressive unit 12/19 EEG consistent with generalized  nonspecific cerebral dysfunction. 12/27 nephrology consulted for AKI, recommended deferring Lasix except for respiratory status. 1/7, nephrology deems not a dialysis candidate given age and multiple comorbidities/Poor functional status, progressive worsening kidney dysfunction, not improving with any medical management for CHF flare 1/12 advanced heart failure does not recommend any further medical management for CHF with reduced EF, and that encourages goals of care discussion 1/13 patient son like to transfer to tertiary care center for initiation of dialysis,  1/15 I personally discussed with [facility] as well as [facility] both of which currently are at full capacity and unable to take the patient.  Patient is not placed on any wait list on both facilities.  Changing Jevity to Nepro and increasing free water. 1/17 nephrology reconsulted for final recommendation.  Advanced heart failure team was also reconsulted. 1/18 patient appears to be more volume overloaded, fluids stopped.  Continue with free water.  Lower extremity Doppler done, ruled out DVT.  Shows anasarca. 1/19 patient's son discussed with chief of medical staff who in turn discussed the case with me.  Family wants to consider normal saline, a clearly explained that the patient is suffering from severe anasarca and it would not be in her best interest.  For hyponatremia we reduce the free water and will recheck the sodium later in the afternoon.  02/20/2018.  [place] again at family's request, discussed with 2 different nephrologist there and felt the transfer is not medically necessary.  Called [facility] at the request of family as well, discussed with hospitalist and medical administration at [facility] and they felt that the transfer is not medically necessary.  Vomiting in a.m., tube feeds were held, resumed trickle.  Worsening right pleural effusion.  Offered daughter Conservator, museum/gallery.  Procedures  . Echo 1/17  Study Result   Result status: Final result                              *  [facility]*                         [place] [employer], [place] [address]                            [phone]  ------------------------------------------------------------------- Transthoracic Echocardiography  Patient:    Darlene, [name] MR #:       [id number] Study Date: 02/16/2018 Gender:     F Age:        48 Height:     160 cm Weight:     78.1 kg BSA:        1.89 m^2 Pt. Status: Room:       2W36C   ATTENDING    Clyde Lundborg, Diamantina Providence M  REFERRING    Lavina Hamman  PERFORMING   [facility], Inpatient  SONOGRAPHER  Holden, RDCS  ADMITTING    Janora Norlander  cc:  ------------------------------------------------------------------- LV EF: 30%  ------------------------------------------------------------------- Indications:      CHF - 428.0.  ------------------------------------------------------------------- History:   PMH:   Coronary artery disease.  Stroke.  Risk factors: Hypertension. Diabetes mellitus.  ------------------------------------------------------------------- Study Conclusions  - Left ventricle: The cavity size was normal. Wall thickness was   normal. The estimated ejection fraction was 30%. Diffuse   hypokinesis. Features are consistent with a pseudonormal left   ventricular filling pattern, with concomitant abnormal relaxation   and increased filling pressure (grade 2 diastolic dysfunction). - Aortic valve: There was no stenosis. - Mitral valve: Mildly calcified annulus with restricted posterior   leaflet. There was moderate regurgitation. - Left atrium: The atrium was mildly dilated. - Right ventricle: The cavity size was mildly dilated. Systolic   function was moderately reduced. - Right atrium: The atrium was mildly  dilated. - Systemic veins: IVC not visualized. - Pericardium, extracardiac: There was a pleural effusion.  Impressions:  - Technically difficult study with poor acoustic windows. Normal LV   size with diffuse hypokinesis, EF 30%. Moderate diastolic   dysfunction. Mildly dilated RV with moderately decreased systolic   function. Moderate mitral regurgitation.    Antibiotics  .   Interval History/Subjective  Per daughter, patient had a good night and rested well.  This is corroborated by chart documentation.  Patient is awake this morning and follow some simple commands but does not provide significant history.  Episodes of hypoglycemia noted.  Objective   Vitals:  Vitals:   02/22/18 0451 02/22/18 0751  BP: 111/61 130/70  Pulse: 61 66  Resp: 12 17  Temp:    SpO2: 100% 100%    Exam:  Constitutional:   . Appears calm and comfortable, more awake today Eyes:  . pupils and irises appear normal ENMT:  . grossly normal hearing  . Tongue appears unremarkable Respiratory:  . CTA bilaterally, no w/r/r.  . Respiratory effort normal.  Cardiovascular:  . RRR, no m/r/g . 3+ bilateral LE extremity edema   Abdomen:  . Soft, nontender, nondistended.  PEG tube in place. Psychiatric:  . Mental status o Confused . Unable to assess orientation and judgment and insight  I have personally reviewed the following:   Today's Data  . Urine output 300 again.  +13 L since admission.  Lab Data  . CBG notable for several lows. . Potassium 4.9, BUN 155, creatinine 4.26  Micro Data  .   Imaging  . Abdominal x-ray 1/21 shows stable gastrostomy tube and bowel gas pattern within normal limits. . Chest x-ray 1/21 showed right greater than left pleural effusion.  Cardiology Data  .   Other Data  .   Scheduled Meds: . atorvastatin  40 mg Per Tube q1800  . chlorhexidine  15 mL Mouth Rinse BID  . cholecalciferol  400 Units Per Tube Daily  . clopidogrel  75 mg Per Tube Daily   . collagenase   Topical Daily  . dextrose      . famotidine  10 mg Oral Daily  . feeding supplement (NEPRO CARB STEADY)  1,000 mL Per Tube Q24H  . ferrous sulfate  300 mg Per Tube Daily  . free water  30 mL Per Tube Q8H  . insulin aspart  0-15 Units Subcutaneous Q4H  . isosorbide-hydrALAZINE  0.5 tablet Per Tube TID  . mouth rinse  15 mL Mouth Rinse q12n4p  . metoprolol tartrate  25 mg Per Tube BID  . multivitamin  15 mL Per Tube Daily  . patiromer  8.4 g Oral Daily   Continuous Infusions: . sodium chloride Stopped (01/08/18 2158)  . dextrose 5 % and 0.45% NaCl      Principal Problem:   AKI (acute kidney injury) ([place]) Active Problems:   Diabetes mellitus ([place])   Hyperlipidemia   Essential hypertension   Coronary atherosclerosis   Pleural effusion   Stroke (cerebrum) (HCC)   Stage 3 chronic kidney disease (HCC)   Enlarged lymph node   Dysarthria   Adult failure to thrive   UTI (urinary tract infection)   Palliative care by specialist   NSTEMI (non-ST elevated myocardial infarction) ([place])   Acute on chronic respiratory failure with hypoxia (HCC)   CKD (chronic kidney disease) stage 5, GFR less than 15 ml/min (HCC)   Acute metabolic encephalopathy   Acute on chronic combined systolic and diastolic CHF (congestive heart failure) ([place])   Anasarca   LOS: 75 days    Time 35 minutes, greater than 50% discussion with son by telephone, daughter at bedside, nephrology, administrative support

## 2018-02-23 LAB — RENAL FUNCTION PANEL
Albumin: 1.9 g/dL — ABNORMAL LOW (ref 3.5–5.0)
Anion gap: 13 (ref 5–15)
BUN: 153 mg/dL — AB (ref 8–23)
CO2: 21 mmol/L — ABNORMAL LOW (ref 22–32)
CREATININE: 4.33 mg/dL — AB (ref 0.44–1.00)
Calcium: 8.9 mg/dL (ref 8.9–10.3)
Chloride: 92 mmol/L — ABNORMAL LOW (ref 98–111)
GFR calc Af Amer: 10 mL/min — ABNORMAL LOW (ref >60)
GFR calc non Af Amer: 9 mL/min — ABNORMAL LOW (ref >60)
Glucose, Bld: 121 mg/dL — ABNORMAL HIGH (ref 70–99)
Phosphorus: 5.2 mg/dL — ABNORMAL HIGH (ref 2.5–4.6)
Potassium: 4.5 mmol/L (ref 3.5–5.1)
Sodium: 126 mmol/L — ABNORMAL LOW (ref 135–145)

## 2018-02-23 LAB — GLUCOSE, CAPILLARY
Glucose-Capillary: 110 mg/dL — ABNORMAL HIGH (ref 70–99)
Glucose-Capillary: 110 mg/dL — ABNORMAL HIGH (ref 70–99)
Glucose-Capillary: 127 mg/dL — ABNORMAL HIGH (ref 70–99)
Glucose-Capillary: 144 mg/dL — ABNORMAL HIGH (ref 70–99)
Glucose-Capillary: 127 mg/dL — ABNORMAL HIGH (ref 70–99)

## 2018-02-23 MED ORDER — NEPRO/CARBSTEADY PO LIQD
1000.0000 mL | ORAL | Status: DC
  Administered 2018-02-23 – 2018-02-24 (×2): 1000 mL
  Filled 2018-02-23 (×5): qty 1000

## 2018-02-23 MED ORDER — JEVITY 1.2 CAL PO LIQD
1000.0000 mL | ORAL | Status: DC
  Filled 2018-02-23: qty 1000

## 2018-02-23 MED ORDER — SODIUM CHLORIDE 0.9 % IV SOLN
INTRAVENOUS | Status: DC
  Administered 2018-02-23: 30 mL/h via INTRAVENOUS
  Administered 2018-02-25: 04:00:00 via INTRAVENOUS

## 2018-02-23 NOTE — Ethics Note (Signed)
I met with care team (Dr Sarajane Jews primary, Dr. Hulen Skains surgical consultant, Dr. Posey Pronto, renal consultant and nurse whose name I neglected to get) regarding questions about futile treatments with a sharp focus on dialysis.    Preliminary issues: 1. The patient does not have an advanced directive 2. The patient currently lacks capacity and is unlikely to regain capacity in the foreseeable future. 3. The son and daughter are the appropriate surrogate decision makers.  Current status: While the patient was reportedly high functioning earlier before her prolonged illness, she has become progressively ill to the point that the full care team agrees that recovery is unlikely at best.  The patient is progressively uremic.  She is also suffering.  Nurses have heard the patient ask to "let me die."  Because the patient lacks capacity, the team cannot rely on that statement.  The son and daughter both want continued aggressive care.  Dr. Posey Pronto, speaking for the full nephrology team, strongly makes the case that dialysis would be a futile treatment in that it would not improve her outcome and could add to her suffering.  As such, they are united in not offering dialysis.  Drs Sarajane Jews and Hulen Skains agree that recovery is highly unlikely and that dialysis is unlikely to change that prognosis.  They stop short in declaring dialysis as a futile treatment and lean toward a trial of dialysis.    Because the medical team is not united in declaring dialysis a futile treatment.  It is appropriate to try to transfer the patient to another facility where a trial of dialysis could be performed.  The patient has already been turned down by [facility].  Drs. Sarajane Jews and Hulen Skains will attempt to transfer to [facility] or [facility].  Note: attempts to facilitate transfer will be limited.  At present, Drs. Sarajane Jews and [place] only plan to contact those two institutions.  Of course, the family is fully free to explore other transfer options and, if  they can find an accepting facility, the team should make the transfer.  Final notes: 1. The team recognizes that it should continue to work toward collaborative decision-making with the family.  This meeting is an excellent example of the team working together to try to meet the needs of the patient and family.   2. We should make sure everyone, I.e. family and medical team, keep the patient at the center.  Dying is likely inevitable and we are unlikely to change that outcome.  Suffering is not inevitable and all attempts should be made to alleviate suffering. 3. If transfer does not happen, the patient should be made a "no code." Without dialysis, the outcome of death is certain.  CPR would inflict further pain without any benefit and would be a futile treatment.  I am available for further ethics consultation. Madison Hickman, MD Pager [phone] Cell phone [phone]

## 2018-02-23 NOTE — Plan of Care (Signed)
  Problem: Education: Goal: Knowledge of General Education information will improve Description Including pain rating scale, medication(s)/side effects and non-pharmacologic comfort measures  Outcome: Progressing   Problem: Health Behavior/Discharge Planning: Goal: Ability to manage health-related needs will improve Outcome: Progressing   Problem: Clinical Measurements: Goal: Ability to maintain clinical measurements within normal limits will improve Outcome: Progressing Goal: Will remain free from infection Outcome: Progressing Goal: Diagnostic test results will improve Outcome: Progressing Goal: Cardiovascular complication will be avoided Outcome: Progressing   Problem: Activity: Goal: Risk for activity intolerance will decrease Outcome: Progressing   Problem: Coping: Goal: Level of anxiety will decrease Outcome: Progressing   Problem: Elimination: Goal: Will not experience complications related to urinary retention Outcome: Progressing   Problem: Safety: Goal: Ability to remain free from injury will improve Outcome: Progressing   Problem: Skin Integrity: Goal: Risk for impaired skin integrity will decrease Outcome: Progressing   Problem: Activity: Goal: Ability to tolerate increased activity will improve Outcome: Progressing   Problem: Health Behavior/Discharge Planning: Goal: Ability to safely manage health-related needs after discharge will improve Outcome: Progressing   Problem: Education: Goal: Knowledge of secondary prevention will improve Outcome: Progressing Goal: Knowledge of patient specific risk factors addressed and post discharge goals established will improve Outcome: Progressing Goal: Individualized Educational Video(s) Outcome: Progressing   Problem: Nutrition: Goal: Risk of aspiration will decrease Outcome: Progressing

## 2018-02-23 NOTE — Progress Notes (Signed)
[name], female   DOB: 04/25/1927, 85 y.o.   MRN: [id number]  This NP stopped by room, no family at bedside.  Discussed patient situation with nursing.  Patient continues to fail to thrive, family remain steady in their request for life prolonging measures.  No plan for shift to a more comfort approach.  Ethics involved.   Family has been encouraged to call Palliative multiple times int he past with questions or concerns, no calls up to this time from family.  Contact  palliative if we can be of assistance.  Wadie Lessen NP  # [phone]   No charge

## 2018-02-23 NOTE — Progress Notes (Addendum)
PROGRESS NOTE  [name] VHQ:[id number] DOB: Sep 01, 1927 DOA: 12/05/2017 PCP: Deland Pretty, MD  Brief History   85 year old woman admitted 11/9; presented with stuttering, progressive to total aphasia, admitted for dysarthria, UTI, achalasia with 50 pound weight loss. Patient underwent PEG tube placement for dysphagia secondary to achalasia.  Later hospitalization suffered NSTEMI, found to have acute systolic and diastolic CHF, suffered acute respiratory failure requiring intubation December 2-December 6, was treated for UTI, pneumonia.  Suffered new cerebral infarcts and was seen by neurology. Treated for heart failure with poor diuresis with Lasix, seen by cardiology and reportedly advanced heart failure recommended no aggressive measures.  She subsequently developed acute kidney injury and was seen by nephrology and dialysis was not offered.  Family requested transfer to tertiary care center but both [place] refused.  Family continues to desire full aggressive care including hemodialysis.  At this point major issue is acute kidney injury without improvement, metabolic encephalopathy.  A & P  Acute kidney injury superimposed on CKD stage III, complicated by systolic CHF, poor nutrition.  At this point nephrology is recommended against hemodialysis.  [facility] and [facility] refused transfer earlier in hospitalization. --No significant change in renal function, urine output about the same, continues to have anasarca --Discussed in detail with son by telephone this morning, reviewed laboratory studies.  He continues to request trial of dialysis. --Previous attending physician and nephrologist recommended against diuretic.  Goals of care --Prognosis is guarded, the patient's encephalopathic and uremic with no improvement in renal function.  Urine output is poor.  As per son continue full scope of care.  Acute metabolic encephalopathy.   --Per the son the patient was high functioning prior  to illness.  Is not clear to what to make of the neurology impression charted earlier given limited contact with the patient.  She is somewhat awake and responsive today.  It is difficult to determine the degree which uremia is influencing this.  Acute on chronic combined systolic, diastolic CHF, moderate MR, moderate RV systolic dysfunction, bilateral pleural effusions, anasarca, hypoalbuminemia.  Status post multiple acute hypoxic respiratory events secondary to volume overload.  Per chart has not responded well to medical management with diuretic therapy.  I discussed with previous attending physician Dr. Posey Pronto who advised against diuretic therapy based on his care of the patient.  Per chart, advanced heart failure team felt patient not a candidate for inotropic agents. --Per chart there appears to be little to offer here other than hemodialysis.  Dysphagia secondary to achalasia, failed Botox, moderate malnutrition --Discussed in detail with son, he would like to start trickle feeds, understands the risk of aspiration.  Clearly the patient does need nutrition.  Will initiate trickle feeds today.  Diabetes mellitus type 2, A1c 5.9 --Normoglycemic off insulin, on dextrose infusion.  Acute on chronic anemia, anemia of chronic disease, CKD --Hemoglobin has been stable.  Tardive dyskinesia diagnosed 12/19 during this hospitalization, presumed secondary to Reglan.  Cogentin stopped 1/10.  Status post treatment for ESBL Klebsiella aspiration pneumonia, enterococcus UTI  Status post NSTEMI, cardiology recommended medical management only. --Continue Lipitor, Plavix, Lopressor  Left temporal and frontal punctate CVA, seen by neurology --Continue Plavix and Lipitor  Lymph node within umbilical hernia, son declined core biopsy.  General surgery has evaluated, no further recommendations at this time.   Continue present care, prognosis remains guarded.  Will update later today with results of ethics  meeting.  Addendum, ethics meeting with Drs. Hensel, Hulen Skains and [place].  Nephrology believes hemodialysis is futile treatment and thus will not be offered here. However I believe there is a small chance that hemodialysis may meaningfully improve her condition.  I discussed this with the patient's son and he agrees with transfer if possible.  I discussed with Dr. Hulen Skains, who has discussed with Dr. Candiss Norse at [facility] and we have tentative acceptance at [facility] but not before 1/27. Will initiate transfer to [facility] 1/27 when accepted.  DVT prophylaxis: TED hose Code Status: Full Family Communication:  Disposition Plan: pending    [name], MD  [facility] Direct contact: see [email or web address]  7PM-7AM contact night coverage as above 02/23/2018, 2:09 PM  LOS: 76 days   Consultants  . Neurology . Cardiology . General surgery . Critical care . Gastroenterology . Interventional radiology . Nephrology . PMT  Hospital events 11/10 PEG placed poor oral intake related 11/19 General surgery consulted, found to have nonincarcerated umbilical hernia no surgical needs and lymph node within hernia. 11/21 developed respiratory failure due to to significant volume overload from CHF exacerbation, treated aggressively with IV diuretics, NSTEMI medically managed, EF found to be 30% at that time 11/27 ENT consulted for stridor, flexible endoscopy negative 12/2 intubated for persistent pulmonary edema/hypervolemic status from heart failure flare (critical care was consulted for agonal respirations prompting transfer to ICU), with extensive IV diuresis, extubated 12/6 12/4 MRI showed new infarcts and patient was seen by neurology. 12/17 persistent AKI/worsening of GFR 12/18 transfer to progressive unit 12/19 EEG consistent with generalized nonspecific cerebral dysfunction. 12/27 nephrology consulted for AKI, recommended deferring Lasix except for respiratory status. 1/7, nephrology deems not a  dialysis candidate given age and multiple comorbidities/Poor functional status, progressive worsening kidney dysfunction, not improving with any medical management for CHF flare 1/12 advanced heart failure does not recommend any further medical management for CHF with reduced EF, and that encourages goals of care discussion 1/13 patient son like to transfer to tertiary care center for initiation of dialysis,  1/15 I personally discussed with [facility] as well as [facility] both of which currently are at full capacity and unable to take the patient.  Patient is not placed on any wait list on both facilities.  Changing Jevity to Nepro and increasing free water. 1/17 nephrology reconsulted for final recommendation.  Advanced heart failure team was also reconsulted. 02/20/2018.  [place] again at family's request, discussed with 2 different nephrologist there and felt the transfer is not medically necessary.  Called [facility] at the request of family as well, discussed with hospitalist and medical administration at [facility] and they felt that the transfer is not medically necessary.  Vomiting in a.m., tube feeds were held, resumed trickle.  Worsening right pleural effusion.  Offered daughter Psychologist, forensic.  Procedures  . Echo 1/17  Study Result   Result status: Final result                              *[facility]*                         [place] [address]                        [place], [place] [address]  [phone]  ------------------------------------------------------------------- Transthoracic Echocardiography  Patient:    Meranda, Dechaine MR #:       [id number] Study Date: 02/16/2018 Gender:     F Age:        5 Height:     160 cm Weight:     78.1 kg BSA:        1.89 m^2 Pt. Status: Room:       2W36C   ATTENDING    Clyde Lundborg, Diamantina Providence M   REFERRING    Lavina Hamman  PERFORMING   [facility], Inpatient  SONOGRAPHER  [place], RDCS  ADMITTING    Janora Norlander  cc:  ------------------------------------------------------------------- LV EF: 30%  ------------------------------------------------------------------- Indications:      CHF - 428.0.  ------------------------------------------------------------------- History:   PMH:   Coronary artery disease.  Stroke.  Risk factors: Hypertension. Diabetes mellitus.  ------------------------------------------------------------------- Study Conclusions  - Left ventricle: The cavity size was normal. Wall thickness was   normal. The estimated ejection fraction was 30%. Diffuse   hypokinesis. Features are consistent with a pseudonormal left   ventricular filling pattern, with concomitant abnormal relaxation   and increased filling pressure (grade 2 diastolic dysfunction). - Aortic valve: There was no stenosis. - Mitral valve: Mildly calcified annulus with restricted posterior   leaflet. There was moderate regurgitation. - Left atrium: The atrium was mildly dilated. - Right ventricle: The cavity size was mildly dilated. Systolic   function was moderately reduced. - Right atrium: The atrium was mildly dilated. - Systemic veins: IVC not visualized. - Pericardium, extracardiac: There was a pleural effusion.  Impressions:  - Technically difficult study with poor acoustic windows. Normal LV   size with diffuse hypokinesis, EF 30%. Moderate diastolic   dysfunction. Mildly dilated RV with moderately decreased systolic   function. Moderate mitral regurgitation.    Antibiotics  .   Interval History/Subjective  Per daughter the patient had a good night.  This morning the patient is awake and alert and follows some simple commands but her verbal responses are not appropriate and she does not answer simple questions.  Objective   Vitals:  Vitals:   02/23/18 1208  02/23/18 1300  BP: 126/72   Pulse: 68 67  Resp: 15 12  Temp: (!) 97.3 F (36.3 C)   SpO2: 100% 97%    Exam:  Constitutional:   . Appears anxious, irritable, crying out frequently, appears ill but not toxic Eyes:  . pupils and irises appear normal . Normal lids  ENMT:  . Lips appear normal Respiratory:  . CTA bilaterally, no w/r/r.  . Respiratory effort normal. Cardiovascular:  . RRR, no m/r/g . 3+ bilateral LE extremity edema   Abdomen:  . Soft, nontender, nondistended. Musculoskeletal:  . RUE, LUE, RLE, LLE   . Moves all extremities Psychiatric:  . Mental status . Confused, cannot assess mood or affect, follows some simple commands but speech not appropriate.    I have personally reviewed the following:   Today's Data  . Urine output 350.  I/oh reports +13.5 L  Lab Data  . CBG stable.  No lows last 24 hours. . Potassium 4.5.  Sodium stable 126.  BUN stable at 153, creatinine stable at 4.33.  Micro Data  .   Imaging  .   Cardiology Data  .   Other Data  .   Scheduled Meds: . atorvastatin  40 mg Per Tube q1800  . chlorhexidine  15 mL  Mouth Rinse BID  . cholecalciferol  400 Units Per Tube Daily  . clopidogrel  75 mg Per Tube Daily  . collagenase   Topical Daily  . famotidine  10 mg Oral Daily  . ferrous sulfate  300 mg Per Tube Daily  . free water  30 mL Per Tube Q8H  . isosorbide-hydrALAZINE  0.5 tablet Per Tube TID  . mouth rinse  15 mL Mouth Rinse q12n4p  . metoprolol tartrate  25 mg Per Tube BID  . multivitamin  15 mL Per Tube Daily  . patiromer  8.4 g Oral Daily   Continuous Infusions: . sodium chloride Stopped (01/08/18 2158)  . dextrose 5 % and 0.45% NaCl 50 mL/hr at 02/23/18 0800  . feeding supplement (JEVITY 1.2 CAL)      Principal Problem:   AKI (acute kidney injury) ([place]) Active Problems:   Diabetes mellitus ([place])   Hyperlipidemia   Essential hypertension   Coronary atherosclerosis   Pleural effusion   Stroke (cerebrum)  (HCC)   Stage 3 chronic kidney disease (HCC)   Enlarged lymph node   Dysarthria   Adult failure to thrive   UTI (urinary tract infection)   Palliative care by specialist   NSTEMI (non-ST elevated myocardial infarction) ([place])   Acute on chronic respiratory failure with hypoxia (HCC)   CKD (chronic kidney disease) stage 5, GFR less than 15 ml/min (HCC)   Acute metabolic encephalopathy   Acute on chronic combined systolic and diastolic CHF (congestive heart failure) ([place])   Anasarca   LOS: 76 days   Time 50 minutes, greater than 50% in counseling coordination of care meeting with ethics committee, nephrology, CMO

## 2018-02-23 NOTE — Progress Notes (Signed)
Nutrition Consult/Brief Note  RD consulted for TF re-initiation. Spoke with Marita [place], Therapist, sports. Plan is to restart TF via PEG tube at trickle rate. Per RN pt has had no vomiting today. Will order Nepro Carb Steady at 20 ml/hr; provides 864 kcals, 39 gm protein, 349 ml of free water daily. Resume liquid MVI to help meet RDI's. RD to continue to follow.  Arthur Holms, RD, LDN Pager #: [phone] After-Hours Pager #: [phone]

## 2018-02-23 NOTE — Plan of Care (Signed)
  Problem: Education: Goal: Knowledge of General Education information will improve Description Including pain rating scale, medication(s)/side effects and non-pharmacologic comfort measures  Outcome: Not Progressing   Problem: Clinical Measurements: Goal: Ability to maintain clinical measurements within normal limits will improve Outcome: Not Progressing   Problem: Clinical Measurements: Goal: Will remain free from infection Outcome: Not Progressing   Problem: Clinical Measurements: Goal: Diagnostic test results will improve Outcome: Not Progressing   Problem: Education: Goal: Knowledge of secondary prevention will improve Outcome: Not Progressing

## 2018-02-24 LAB — RENAL FUNCTION PANEL
Albumin: 1.8 g/dL — ABNORMAL LOW (ref 3.5–5.0)
Anion gap: 13 (ref 5–15)
BUN: 149 mg/dL — ABNORMAL HIGH (ref 8–23)
CO2: 22 mmol/L (ref 22–32)
Calcium: 8.7 mg/dL — ABNORMAL LOW (ref 8.9–10.3)
Chloride: 93 mmol/L — ABNORMAL LOW (ref 98–111)
Creatinine, Ser: 4.35 mg/dL — ABNORMAL HIGH (ref 0.44–1.00)
GFR calc Af Amer: 10 mL/min — ABNORMAL LOW (ref >60)
GFR calc non Af Amer: 8 mL/min — ABNORMAL LOW (ref >60)
GLUCOSE: 130 mg/dL — AB (ref 70–99)
Phosphorus: 5.2 mg/dL — ABNORMAL HIGH (ref 2.5–4.6)
Potassium: 4.2 mmol/L (ref 3.5–5.1)
SODIUM: 128 mmol/L — AB (ref 135–145)

## 2018-02-24 LAB — GLUCOSE, CAPILLARY
Glucose-Capillary: 132 mg/dL — ABNORMAL HIGH (ref 70–99)
Glucose-Capillary: 137 mg/dL — ABNORMAL HIGH (ref 70–99)
Glucose-Capillary: 145 mg/dL — ABNORMAL HIGH (ref 70–99)
Glucose-Capillary: 145 mg/dL — ABNORMAL HIGH (ref 70–99)
Glucose-Capillary: 149 mg/dL — ABNORMAL HIGH (ref 70–99)
Glucose-Capillary: 159 mg/dL — ABNORMAL HIGH (ref 70–99)

## 2018-02-24 NOTE — Progress Notes (Addendum)
Daughter mentioned pt smells foul.  Pt is bathed each day and sheets chnaged.  Last Blood Cx was taken 01/01/18.  Daughter is interested n asking MD if it can be repeated.   Daughter talked to brother questioning UA and CX if it can be done, then backed off and son does not want foley changed in order to to take UA and Cx, he states pt is hard to insert foley.  MD returned page and deferred response to day MD

## 2018-02-24 NOTE — Progress Notes (Signed)
PROGRESS NOTE  [name] ZOX:[id number] DOB: 19-Apr-1927 DOA: 12/05/2017 PCP: Deland Pretty, MD  Brief History   85 year old woman admitted 11/9; presented with stuttering, progressive to total aphasia, admitted for dysarthria, UTI, achalasia with 50 pound weight loss. Patient underwent PEG tube placement for dysphagia secondary to achalasia.  Later hospitalization suffered NSTEMI, found to have acute systolic and diastolic CHF, suffered acute respiratory failure requiring intubation December 2-December 6, was treated for UTI, pneumonia.  Suffered new cerebral infarcts and was seen by neurology. Treated for heart failure with poor diuresis with Lasix, seen by cardiology and reportedly advanced heart failure recommended no aggressive measures.  She subsequently developed acute kidney injury and was seen by nephrology and dialysis was not offered.  Family requested transfer to tertiary care center but both [place] refused.  Family continues to desire full aggressive care including hemodialysis.  At this point major issue is acute kidney injury without improvement, metabolic encephalopathy.  A & P  Acute kidney injury superimposed on CKD stage III, complicated by systolic CHF, poor nutrition.  Nephrology has declined to offer hemodialysis.  [facility] refused transfer earlier in hospitalization. --Renal function holding steady.  Urine output has improved.  No change in significant volume overload.     Goals of care --Prognosis remains guarded but no apparent change over the last 72 hours.  Acute metabolic encephalopathy.   --Per the son the patient was high functioning prior to illness.  Is not clear to what to make of the neurology impression charted earlier given limited contact with the patient.  She is more awake today, somewhat interactive and appropriate.  Acute on chronic combined systolic, diastolic CHF, moderate MR, moderate RV systolic dysfunction, bilateral pleural  effusions, anasarca, hypoalbuminemia.  Status post multiple acute hypoxic respiratory events secondary to volume overload.  Per chart has not responded well to medical management with diuretic therapy.  I discussed with previous attending physician Dr. Posey Pronto who advised against diuretic therapy based on his care of the patient.  Per chart, advanced heart failure team felt patient not a candidate for inotropic agents. --Per chart there appears to be little to offer here other than hemodialysis.  Dysphagia secondary to achalasia, failed Botox, moderate malnutrition --Tolerating trickle feeds well.  Will increase rate tomorrow.  No vomiting.  Continue low rate IV fluids to support.  Diabetes mellitus type 2, A1c 5.9 --Remains normoglycemic off of insulin.     Acute on chronic anemia, anemia of chronic disease, CKD --Hemoglobin has been stable.  Tardive dyskinesia diagnosed 12/19 during this hospitalization, presumed secondary to Reglan.  Cogentin stopped 1/10.  Status post treatment for ESBL Klebsiella aspiration pneumonia, enterococcus UTI  Status post NSTEMI, cardiology recommended medical management only. --Continue Lipitor, Plavix, Lopressor  Left temporal and frontal punctate CVA, seen by neurology --Continue Plavix and Lipitor  Lymph node within umbilical hernia, son declined core biopsy.  General surgery has evaluated, no further recommendations at this time.   Will continue present care.  As previously documented, plan will be transfer to [facility] for nephrology evaluation and trial of hemodialysis as per Dr. Candiss Norse 1/27.  DVT prophylaxis: TED hose Code Status: Full Family Communication:  Disposition Plan: pending    [name], MD  [facility] Direct contact: see [email or web address]  7PM-7AM contact night coverage as above 02/24/2018, 5:45 PM  LOS: 77 days   Consultants  . Neurology . Cardiology . General surgery . Critical  care . Gastroenterology . Interventional radiology . Nephrology .  PMT  Hospital events 11/10 PEG placed poor oral intake related 11/19 General surgery consulted, found to have nonincarcerated umbilical hernia no surgical needs and lymph node within hernia. 11/21 developed respiratory failure due to to significant volume overload from CHF exacerbation, treated aggressively with IV diuretics, NSTEMI medically managed, EF found to be 30% at that time 11/27 ENT consulted for stridor, flexible endoscopy negative 12/2 intubated for persistent pulmonary edema/hypervolemic status from heart failure flare (critical care was consulted for agonal respirations prompting transfer to ICU), with extensive IV diuresis, extubated 12/6 12/4 MRI showed new infarcts and patient was seen by neurology. 12/17 persistent AKI/worsening of GFR 12/18 transfer to progressive unit 12/19 EEG consistent with generalized nonspecific cerebral dysfunction. 12/27 nephrology consulted for AKI, recommended deferring Lasix except for respiratory status. 1/7, nephrology deems not a dialysis candidate given age and multiple comorbidities/Poor functional status, progressive worsening kidney dysfunction, not improving with any medical management for CHF flare 1/12 advanced heart failure does not recommend any further medical management for CHF with reduced EF, and that encourages goals of care discussion 1/13 patient son like to transfer to tertiary care center for initiation of dialysis,  1/15 I personally discussed with [facility] as well as [facility] both of which currently are at full capacity and unable to take the patient.  Patient is not placed on any wait list on both facilities.  Changing Jevity to Nepro and increasing free water. 1/17 nephrology reconsulted for final recommendation.  Advanced heart failure team was also reconsulted. 02/20/2018.  [place] again at family's request,  discussed with 2 different nephrologist there and felt the transfer is not medically necessary.  Called [facility] at the request of family as well, discussed with hospitalist and medical administration at [facility] and they felt that the transfer is not medically necessary.  Vomiting in a.m., tube feeds were held, resumed trickle.  Worsening right pleural effusion.  Offered daughter Psychologist, forensic.  Procedures  . Echo 1/17  Study Result   Result status: Final result                              *[facility]*                         [address], [place] [address]                            [phone]  ------------------------------------------------------------------- Transthoracic Echocardiography  Patient:    [name], [name] MR #:       [id number] Study Date: 02/16/2018 Gender:     F Age:        77 Height:     160 cm Weight:     78.1 kg BSA:        1.89 m^2 Pt. Status: Room:       [place], [place]  REFERRING    Berle Mull M  PERFORMING   [facility], Inpatient  SONOGRAPHER  Jannett Celestine, RDCS  ADMITTING  [place] M  cc:  ------------------------------------------------------------------- LV EF: 30%  ------------------------------------------------------------------- Indications:      CHF - 428.0.  ------------------------------------------------------------------- History:   PMH:   Coronary artery disease.  Stroke.  Risk factors: Hypertension. Diabetes mellitus.  ------------------------------------------------------------------- Study Conclusions  - Left ventricle: The cavity size was normal. Wall thickness was   normal. The estimated ejection fraction was 30%. Diffuse   hypokinesis. Features are consistent with a pseudonormal left   ventricular filling pattern, with concomitant abnormal relaxation   and increased  filling pressure (grade 2 diastolic dysfunction). - Aortic valve: There was no stenosis. - Mitral valve: Mildly calcified annulus with restricted posterior   leaflet. There was moderate regurgitation. - Left atrium: The atrium was mildly dilated. - Right ventricle: The cavity size was mildly dilated. Systolic   function was moderately reduced. - Right atrium: The atrium was mildly dilated. - Systemic veins: IVC not visualized. - Pericardium, extracardiac: There was a pleural effusion.  Impressions:  - Technically difficult study with poor acoustic windows. Normal LV   size with diffuse hypokinesis, EF 30%. Moderate diastolic   dysfunction. Mildly dilated RV with moderately decreased systolic   function. Moderate mitral regurgitation.    Antibiotics  .   Interval History/Subjective  Per daughter, the patient had a good night.  No overnight events per nursing.  Tolerating tube feeds, no vomiting.  Objective   Vitals:  Vitals:   02/24/18 1100 02/24/18 1500  BP: 118/77 (!) 135/59  Pulse: 74 79  Resp: 18 (!) 22  Temp: 97.8 F (36.6 C) 98 F (36.7 C)  SpO2: 99% 99%    Exam:  Constitutional:   . Appears more calm today and comfortable.  Not crying out. Eyes:  . pupils and irises appear normal . Normal lids ENMT:  . grossly normal hearing  . Lips appear normal . Limited view of the tongue appears unremarkable Respiratory:  . CTA bilaterally, no w/r/r.  . Respiratory effort normal.  Cardiovascular:  . RRR, no m/r/g . No change in 3+ bilateral LE extremity edema   Abdomen:  . PEG tube in place, no abdominal tenderness or masses Musculoskeletal:  . RUE, LUE, RLE, LLE   . Does not follow commands, but no gross changes noted. Psychiatric:  . Mental status . Mood difficult to discern, affect more appropriate today    I have personally reviewed the following:   Today's Data  . Urine output 775.   Lab Data  . CBG remains stable . Sodium slightly improved at  128, BUN slightly improved 149, creatinine without significant change 4.35.  Albumin 1.8.  Micro Data  .   Imaging  .   Cardiology Data  .   Other Data  .   Scheduled Meds: . atorvastatin  40 mg Per Tube q1800  . chlorhexidine  15 mL Mouth Rinse BID  . cholecalciferol  400 Units Per Tube Daily  . clopidogrel  75 mg Per Tube Daily  . collagenase   Topical Daily  . famotidine  10 mg Oral Daily  . feeding supplement (NEPRO CARB STEADY)  1,000 mL Per Tube Q24H  . ferrous sulfate  300 mg Per Tube Daily  . free water  30 mL Per Tube Q8H  . isosorbide-hydrALAZINE  0.5 tablet Per Tube TID  . mouth rinse  15 mL Mouth Rinse q12n4p  . metoprolol tartrate  25 mg Per Tube BID  . multivitamin  15 mL Per Tube Daily  . patiromer  8.4 g Oral  Daily   Continuous Infusions: . sodium chloride Stopped (01/08/18 2158)  . sodium chloride 30 mL/hr at 02/24/18 1502    Principal Problem:   AKI (acute kidney injury) ([place]) Active Problems:   Diabetes mellitus ([place])   Hyperlipidemia   Essential hypertension   Coronary atherosclerosis   Pleural effusion   Stroke (cerebrum) (HCC)   Stage 3 chronic kidney disease (HCC)   Enlarged lymph node   Dysarthria   Adult failure to thrive   UTI (urinary tract infection)   Palliative care by specialist   NSTEMI (non-ST elevated myocardial infarction) ([place])   Acute on chronic respiratory failure with hypoxia (HCC)   CKD (chronic kidney disease) stage 5, GFR less than 15 ml/min (HCC)   Acute metabolic encephalopathy   Acute on chronic combined systolic and diastolic CHF (congestive heart failure) ([place])   Anasarca   LOS: 77 days

## 2018-02-25 LAB — RENAL FUNCTION PANEL
Albumin: 1.8 g/dL — ABNORMAL LOW (ref 3.5–5.0)
Anion gap: 15 (ref 5–15)
BUN: 150 mg/dL — AB (ref 8–23)
CO2: 21 mmol/L — AB (ref 22–32)
Calcium: 8.5 mg/dL — ABNORMAL LOW (ref 8.9–10.3)
Chloride: 93 mmol/L — ABNORMAL LOW (ref 98–111)
Creatinine, Ser: 4.48 mg/dL — ABNORMAL HIGH (ref 0.44–1.00)
GFR calc Af Amer: 10 mL/min — ABNORMAL LOW (ref >60)
GFR calc non Af Amer: 8 mL/min — ABNORMAL LOW (ref >60)
Glucose, Bld: 144 mg/dL — ABNORMAL HIGH (ref 70–99)
Phosphorus: 5.1 mg/dL — ABNORMAL HIGH (ref 2.5–4.6)
Potassium: 4.3 mmol/L (ref 3.5–5.1)
Sodium: 129 mmol/L — ABNORMAL LOW (ref 135–145)

## 2018-02-25 LAB — GLUCOSE, CAPILLARY
Glucose-Capillary: 134 mg/dL — ABNORMAL HIGH (ref 70–99)
Glucose-Capillary: 152 mg/dL — ABNORMAL HIGH (ref 70–99)
Glucose-Capillary: 159 mg/dL — ABNORMAL HIGH (ref 70–99)
Glucose-Capillary: 161 mg/dL — ABNORMAL HIGH (ref 70–99)
Glucose-Capillary: 161 mg/dL — ABNORMAL HIGH (ref 70–99)
Glucose-Capillary: 169 mg/dL — ABNORMAL HIGH (ref 70–99)
Glucose-Capillary: 177 mg/dL — ABNORMAL HIGH (ref 70–99)

## 2018-02-25 LAB — TSH: TSH: 2.35 u[IU]/mL (ref 0.350–4.500)

## 2018-02-25 NOTE — Progress Notes (Signed)
PROGRESS NOTE  [name] IPJ:[id number] DOB: [birth year] DOA: 12/05/2017 PCP: Deland Pretty, MD  Brief History   85 year old woman admitted 11/9; presented with stuttering, progressive to total aphasia, admitted for dysarthria, UTI, achalasia with 50 pound weight loss. Patient underwent PEG tube placement for dysphagia secondary to achalasia.  Later hospitalization suffered NSTEMI, found to have acute systolic and diastolic CHF, suffered acute respiratory failure requiring intubation December 2-December 6, was treated for UTI, pneumonia.  Suffered new cerebral infarcts and was seen by neurology. Treated for heart failure with poor diuresis with Lasix, seen by cardiology and reportedly advanced heart failure recommended no aggressive measures.  She subsequently developed acute kidney injury and was seen by nephrology and dialysis was not offered.  Family requested transfer to tertiary care center but both [place] refused.  Family continues to desire full aggressive care including hemodialysis.  At this point major issue is acute kidney injury without improvement, metabolic encephalopathy.  A & P  Acute kidney injury superimposed on CKD stage III, complicated by systolic CHF, poor nutrition.  Nephrology has declined to offer hemodialysis.  [facility] refused transfer earlier in hospitalization. --Renal function overall holding steady.  No change in anasarca.   --Continue present management.    Goals of care --Prognosis remains guarded but no obvious worsening of condition over the last 72 hours.  Acute metabolic encephalopathy.   --Per the son the patient was high functioning prior to illness.  Awake and alert and able to follow some simple commands.  Clearly confused  Acute on chronic combined systolic, diastolic CHF, moderate MR, moderate RV systolic dysfunction, bilateral pleural effusions, anasarca, hypoalbuminemia.  Status post multiple acute hypoxic respiratory events  secondary to volume overload.  Per chart has not responded well to medical management with diuretic therapy.  I discussed with previous attending physician Dr. Posey Pronto who advised against diuretic therapy based on his care of the patient.  Per chart, advanced heart failure team felt patient not a candidate for inotropic agents. --Per chart there appears to be little to offer here other than hemodialysis.  Dysphagia secondary to achalasia, failed Botox, moderate malnutrition --Tolerating trickle feeds well.  Will increase rate tomorrow.  No vomiting.  Continue low rate IV fluids to support.   Diabetes mellitus type 2, A1c 5.9 --CBG remains stable  Acute on chronic anemia, anemia of chronic disease, CKD --Hemoglobin has been stable.  Tardive dyskinesia diagnosed 12/19 during this hospitalization, presumed secondary to Reglan.  Cogentin stopped 1/10.  Status post treatment for ESBL Klebsiella aspiration pneumonia, enterococcus UTI  Status post NSTEMI, cardiology recommended medical management only. --Continue Lipitor, Plavix, Lopressor  Left temporal and frontal punctate CVA, seen by neurology --.  Continue Plavix and Lipitor  Lymph node within umbilical hernia, son declined core biopsy.  General surgery has evaluated, no further recommendations at this time.   We will continue current course, unable to reach son presently but will attempt again this afternoon.  Plan on transfer to [place] Dr. Candiss Norse accepting nephrologist not available until then.  DVT prophylaxis: TED hose Code Status: Full Family Communication:  Disposition Plan: pending    [name], MD  [facility] Direct contact: see [email or web address]  7PM-7AM contact night coverage as above 02/25/2018, 2:25 PM  LOS: 78 days   Consultants  . Neurology . Cardiology . General surgery . Critical care . Gastroenterology . Interventional radiology . Nephrology . PMT  Hospital events 11/10 PEG placed poor  oral intake related 11/19  General surgery consulted, found to have nonincarcerated umbilical hernia no surgical needs and lymph node within hernia. 11/21 developed respiratory failure due to to significant volume overload from CHF exacerbation, treated aggressively with IV diuretics, NSTEMI medically managed, EF found to be 30% at that time 11/27 ENT consulted for stridor, flexible endoscopy negative 12/2 intubated for persistent pulmonary edema/hypervolemic status from heart failure flare (critical care was consulted for agonal respirations prompting transfer to ICU), with extensive IV diuresis, extubated 12/6 12/4 MRI showed new infarcts and patient was seen by neurology. 12/17 persistent AKI/worsening of GFR 12/18 transfer to progressive unit 12/19 EEG consistent with generalized nonspecific cerebral dysfunction. 12/27 nephrology consulted for AKI, recommended deferring Lasix except for respiratory status. 1/7, nephrology deems not a dialysis candidate given age and multiple comorbidities/Poor functional status, progressive worsening kidney dysfunction, not improving with any medical management for CHF flare 1/12 advanced heart failure does not recommend any further medical management for CHF with reduced EF, and that encourages goals of care discussion 1/13 patient son like to transfer to tertiary care center for initiation of dialysis,  1/15 I personally discussed with [facility] as well as [facility] both of which currently are at full capacity and unable to take the patient.  Patient is not placed on any wait list on both facilities.  Changing Jevity to Nepro and increasing free water. 1/17 nephrology reconsulted for final recommendation.  Advanced heart failure team was also reconsulted. 02/20/2018.  [place] again at family's request, discussed with 2 different nephrologist there and felt the transfer is not medically necessary.  Called [facility] at the  request of family as well, discussed with hospitalist and medical administration at [facility] and they felt that the transfer is not medically necessary.  Vomiting in a.m., tube feeds were held, resumed trickle.  Worsening right pleural effusion.  Offered daughter Psychologist, forensic.  Procedures  . Echo 1/17  Study Result   Result status: Final result                              *[facility]*                         [address], [place] [address]                            [phone]  ------------------------------------------------------------------- Transthoracic Echocardiography  Patient:    Darlene, [name] MR #:       [id number] Study Date: 02/16/2018 Gender:     F Age:        50 Height:     160 cm Weight:     78.1 kg BSA:        1.89 m^2 Pt. Status: Room:       [place], [place], Inpatient  SONOGRAPHER  [place], RDCS  ADMITTING    Janora Norlander  cc:  ------------------------------------------------------------------- LV EF: 30%  -------------------------------------------------------------------  Indications:      CHF - 428.0.  ------------------------------------------------------------------- History:   PMH:   Coronary artery disease.  Stroke.  Risk factors: Hypertension. Diabetes mellitus.  ------------------------------------------------------------------- Study Conclusions  - Left ventricle: The cavity size was normal. Wall thickness was   normal. The estimated ejection fraction was 30%. Diffuse   hypokinesis. Features are consistent with a pseudonormal left   ventricular filling pattern, with concomitant abnormal relaxation   and increased filling pressure (grade 2 diastolic dysfunction). - Aortic valve: There was no stenosis. - Mitral valve: Mildly  calcified annulus with restricted posterior   leaflet. There was moderate regurgitation. - Left atrium: The atrium was mildly dilated. - Right ventricle: The cavity size was mildly dilated. Systolic   function was moderately reduced. - Right atrium: The atrium was mildly dilated. - Systemic veins: IVC not visualized. - Pericardium, extracardiac: There was a pleural effusion.  Impressions:  - Technically difficult study with poor acoustic windows. Normal LV   size with diffuse hypokinesis, EF 30%. Moderate diastolic   dysfunction. Mildly dilated RV with moderately decreased systolic   function. Moderate mitral regurgitation.    Antibiotics  .   Interval History/Subjective  No issues overnight per RN.  Patient encephalopathic, unable to offer history.  Tolerating tube feeds.  Objective   Vitals:  Vitals:   02/25/18 1135 02/25/18 1200  BP: 123/70   Pulse: 65   Resp: 17   Temp:  (!) 97.4 F (36.3 C)  SpO2: 100%     Exam:  Constitutional:   . Appears mildly anxious, occasionally cries out but overall appears more comfortable today. Eyes:  . pupils and irises appear normal ENMT:  . grossly normal hearing  Respiratory:  . CTA bilaterally, no w/r/r.  . Respiratory effort normal.  Cardiovascular:  . RRR, no m/r/g . No change in 4+ bilateral LE extremity edema   Abdomen:  . Soft, nontender Musculoskeletal:  . RUE, LUE, RLE, LLE   . Globally weak Psychiatric:  . Mental status . Confused, able to follow some simple commands such as opening her mouth, cannot offer history.    I have personally reviewed the following:   Today's Data  . Urine output 325.  . CBG remains stable.  Sodium trending up at 129.  BUN stable at 150, creatinine slowly trending up, 4.48.  Anion gap 15.  TSH within normal limits.  Lab Data  .   Micro Data  .   Imaging  .   Cardiology Data  .   Other Data  .   Scheduled Meds: . atorvastatin  40 mg Per Tube q1800  .  chlorhexidine  15 mL Mouth Rinse BID  . cholecalciferol  400 Units Per Tube Daily  . clopidogrel  75 mg Per Tube Daily  . collagenase   Topical Daily  . famotidine  10 mg Oral Daily  . feeding supplement (NEPRO CARB STEADY)  1,000 mL Per Tube Q24H  . ferrous sulfate  300 mg Per Tube Daily  . free water  30 mL Per Tube Q8H  . isosorbide-hydrALAZINE  0.5 tablet Per Tube TID  . mouth rinse  15 mL Mouth Rinse q12n4p  . metoprolol tartrate  25 mg Per Tube BID  . multivitamin  15 mL Per Tube Daily  . patiromer  8.4 g Oral Daily   Continuous Infusions: . sodium chloride Stopped (01/08/18 2158)  . sodium chloride 30 mL/hr at 02/25/18 7062    Principal Problem:   AKI (acute kidney injury) (  [place]) Active Problems:   Diabetes mellitus ([place])   Hyperlipidemia   Essential hypertension   Coronary atherosclerosis   Pleural effusion   Stroke (cerebrum) (HCC)   Stage 3 chronic kidney disease (HCC)   Enlarged lymph node   Dysarthria   Adult failure to thrive   UTI (urinary tract infection)   Palliative care by specialist   NSTEMI (non-ST elevated myocardial infarction) ([place])   Acute on chronic respiratory failure with hypoxia (HCC)   CKD (chronic kidney disease) stage 5, GFR less than 15 ml/min (HCC)   Acute metabolic encephalopathy   Acute on chronic combined systolic and diastolic CHF (congestive heart failure) ([place])   Anasarca   LOS: 78 days

## 2018-02-25 NOTE — Plan of Care (Signed)
Family aware of care plans for patient.  Patient remains confused.  Problem: Education: Goal: Knowledge of General Education information will improve Description Including pain rating scale, medication(s)/side effects and non-pharmacologic comfort measures  Outcome: Progressing   Problem: Health Behavior/Discharge Planning: Goal: Ability to manage health-related needs will improve Outcome: Progressing   Problem: Clinical Measurements: Goal: Ability to maintain clinical measurements within normal limits will improve Outcome: Progressing Goal: Will remain free from infection Outcome: Progressing Goal: Diagnostic test results will improve Outcome: Progressing Goal: Cardiovascular complication will be avoided Outcome: Progressing   Problem: Activity: Goal: Risk for activity intolerance will decrease Outcome: Progressing   Problem: Coping: Goal: Level of anxiety will decrease Outcome: Progressing   Problem: Elimination: Goal: Will not experience complications related to urinary retention Outcome: Progressing   Problem: Safety: Goal: Ability to remain free from injury will improve Outcome: Progressing   Problem: Skin Integrity: Goal: Risk for impaired skin integrity will decrease Outcome: Progressing   Problem: Activity: Goal: Ability to tolerate increased activity will improve Outcome: Progressing   Problem: Health Behavior/Discharge Planning: Goal: Ability to safely manage health-related needs after discharge will improve Outcome: Progressing   Problem: Education: Goal: Knowledge of secondary prevention will improve Outcome: Progressing Goal: Knowledge of patient specific risk factors addressed and post discharge goals established will improve Outcome: Progressing Goal: Individualized Educational Video(s) Outcome: Progressing   Problem: Nutrition: Goal: Risk of aspiration will decrease Outcome: Progressing

## 2018-02-25 NOTE — Progress Notes (Signed)
ADH sent to [facility] on 02/19/17 result is still pending.  Family has asked for result.

## 2018-02-26 ENCOUNTER — Inpatient Hospital Stay
Admission: RE | Admit: 2018-02-26 | Discharge: 2018-04-03 | DRG: 673 | Disposition: A | Discharge status: Home Health | Payer: Medicare Other | Source: Ambulatory Visit | Attending: Internal Medicine | Admitting: Internal Medicine

## 2018-02-26 ENCOUNTER — Other Ambulatory Visit: Payer: Self-pay

## 2018-02-26 ENCOUNTER — Inpatient Hospital Stay: Payer: Medicare Other

## 2018-02-26 DIAGNOSIS — E1122 Type 2 diabetes mellitus with diabetic chronic kidney disease: Secondary | ICD-10-CM | POA: Diagnosis present

## 2018-02-26 DIAGNOSIS — K9423 Gastrostomy malfunction: Secondary | ICD-10-CM

## 2018-02-26 DIAGNOSIS — R680 Hypothermia, not associated with low environmental temperature: Secondary | ICD-10-CM | POA: Diagnosis present

## 2018-02-26 DIAGNOSIS — D696 Thrombocytopenia, unspecified: Secondary | ICD-10-CM | POA: Diagnosis not present

## 2018-02-26 DIAGNOSIS — N170 Acute kidney failure with tubular necrosis: Principal | ICD-10-CM | POA: Diagnosis present

## 2018-02-26 DIAGNOSIS — N179 Acute kidney failure, unspecified: Secondary | ICD-10-CM | POA: Diagnosis not present

## 2018-02-26 DIAGNOSIS — N178 Other acute kidney failure: Secondary | ICD-10-CM | POA: Diagnosis not present

## 2018-02-26 DIAGNOSIS — I480 Paroxysmal atrial fibrillation: Secondary | ICD-10-CM | POA: Diagnosis not present

## 2018-02-26 DIAGNOSIS — J690 Pneumonitis due to inhalation of food and vomit: Secondary | ICD-10-CM | POA: Diagnosis present

## 2018-02-26 DIAGNOSIS — I5043 Acute on chronic combined systolic (congestive) and diastolic (congestive) heart failure: Secondary | ICD-10-CM | POA: Diagnosis present

## 2018-02-26 DIAGNOSIS — Z7902 Long term (current) use of antithrombotics/antiplatelets: Secondary | ICD-10-CM

## 2018-02-26 DIAGNOSIS — I132 Hypertensive heart and chronic kidney disease with heart failure and with stage 5 chronic kidney disease, or end stage renal disease: Secondary | ICD-10-CM | POA: Diagnosis present

## 2018-02-26 DIAGNOSIS — Z9981 Dependence on supplemental oxygen: Secondary | ICD-10-CM

## 2018-02-26 DIAGNOSIS — Z9842 Cataract extraction status, left eye: Secondary | ICD-10-CM | POA: Diagnosis not present

## 2018-02-26 DIAGNOSIS — J9601 Acute respiratory failure with hypoxia: Secondary | ICD-10-CM | POA: Diagnosis not present

## 2018-02-26 DIAGNOSIS — L89152 Pressure ulcer of sacral region, stage 2: Secondary | ICD-10-CM | POA: Diagnosis present

## 2018-02-26 DIAGNOSIS — Z961 Presence of intraocular lens: Secondary | ICD-10-CM | POA: Diagnosis present

## 2018-02-26 DIAGNOSIS — T85598A Other mechanical complication of other gastrointestinal prosthetic devices, implants and grafts, initial encounter: Secondary | ICD-10-CM | POA: Diagnosis not present

## 2018-02-26 DIAGNOSIS — R0602 Shortness of breath: Secondary | ICD-10-CM

## 2018-02-26 DIAGNOSIS — J811 Chronic pulmonary edema: Secondary | ICD-10-CM

## 2018-02-26 DIAGNOSIS — K220 Achalasia of cardia: Secondary | ICD-10-CM | POA: Diagnosis present

## 2018-02-26 DIAGNOSIS — Z955 Presence of coronary angioplasty implant and graft: Secondary | ICD-10-CM | POA: Diagnosis not present

## 2018-02-26 DIAGNOSIS — D631 Anemia in chronic kidney disease: Secondary | ICD-10-CM | POA: Diagnosis present

## 2018-02-26 DIAGNOSIS — E440 Moderate protein-calorie malnutrition: Secondary | ICD-10-CM | POA: Diagnosis present

## 2018-02-26 DIAGNOSIS — K9429 Other complications of gastrostomy: Secondary | ICD-10-CM

## 2018-02-26 DIAGNOSIS — J9600 Acute respiratory failure, unspecified whether with hypoxia or hypercapnia: Secondary | ICD-10-CM

## 2018-02-26 DIAGNOSIS — B359 Dermatophytosis, unspecified: Secondary | ICD-10-CM | POA: Diagnosis present

## 2018-02-26 DIAGNOSIS — Z9841 Cataract extraction status, right eye: Secondary | ICD-10-CM

## 2018-02-26 DIAGNOSIS — I2510 Atherosclerotic heart disease of native coronary artery without angina pectoris: Secondary | ICD-10-CM | POA: Diagnosis present

## 2018-02-26 DIAGNOSIS — I69354 Hemiplegia and hemiparesis following cerebral infarction affecting left non-dominant side: Secondary | ICD-10-CM | POA: Diagnosis not present

## 2018-02-26 DIAGNOSIS — Z992 Dependence on renal dialysis: Secondary | ICD-10-CM | POA: Diagnosis not present

## 2018-02-26 DIAGNOSIS — R197 Diarrhea, unspecified: Secondary | ICD-10-CM | POA: Diagnosis not present

## 2018-02-26 DIAGNOSIS — I340 Nonrheumatic mitral (valve) insufficiency: Secondary | ICD-10-CM | POA: Diagnosis present

## 2018-02-26 DIAGNOSIS — K429 Umbilical hernia without obstruction or gangrene: Secondary | ICD-10-CM | POA: Diagnosis present

## 2018-02-26 DIAGNOSIS — Z9289 Personal history of other medical treatment: Secondary | ICD-10-CM

## 2018-02-26 DIAGNOSIS — N19 Unspecified kidney failure: Secondary | ICD-10-CM

## 2018-02-26 DIAGNOSIS — Z888 Allergy status to other drugs, medicaments and biological substances status: Secondary | ICD-10-CM

## 2018-02-26 DIAGNOSIS — F0390 Unspecified dementia without behavioral disturbance: Secondary | ICD-10-CM | POA: Diagnosis present

## 2018-02-26 DIAGNOSIS — L89150 Pressure ulcer of sacral region, unstageable: Secondary | ICD-10-CM

## 2018-02-26 DIAGNOSIS — J9602 Acute respiratory failure with hypercapnia: Secondary | ICD-10-CM | POA: Diagnosis present

## 2018-02-26 DIAGNOSIS — G92 Toxic encephalopathy: Secondary | ICD-10-CM | POA: Diagnosis present

## 2018-02-26 DIAGNOSIS — N186 End stage renal disease: Secondary | ICD-10-CM | POA: Diagnosis not present

## 2018-02-26 DIAGNOSIS — R1310 Dysphagia, unspecified: Secondary | ICD-10-CM | POA: Diagnosis present

## 2018-02-26 DIAGNOSIS — T17908A Unspecified foreign body in respiratory tract, part unspecified causing other injury, initial encounter: Secondary | ICD-10-CM

## 2018-02-26 DIAGNOSIS — I959 Hypotension, unspecified: Secondary | ICD-10-CM | POA: Diagnosis not present

## 2018-02-26 DIAGNOSIS — Z91041 Radiographic dye allergy status: Secondary | ICD-10-CM

## 2018-02-26 DIAGNOSIS — Z951 Presence of aortocoronary bypass graft: Secondary | ICD-10-CM

## 2018-02-26 DIAGNOSIS — Z01818 Encounter for other preprocedural examination: Secondary | ICD-10-CM

## 2018-02-26 DIAGNOSIS — Z79899 Other long term (current) drug therapy: Secondary | ICD-10-CM

## 2018-02-26 DIAGNOSIS — T85598D Other mechanical complication of other gastrointestinal prosthetic devices, implants and grafts, subsequent encounter: Secondary | ICD-10-CM | POA: Diagnosis not present

## 2018-02-26 DIAGNOSIS — Z6830 Body mass index (BMI) 30.0-30.9, adult: Secondary | ICD-10-CM

## 2018-02-26 DIAGNOSIS — Z4659 Encounter for fitting and adjustment of other gastrointestinal appliance and device: Secondary | ICD-10-CM

## 2018-02-26 DIAGNOSIS — Z883 Allergy status to other anti-infective agents status: Secondary | ICD-10-CM

## 2018-02-26 DIAGNOSIS — I252 Old myocardial infarction: Secondary | ICD-10-CM

## 2018-02-26 DIAGNOSIS — Z931 Gastrostomy status: Secondary | ICD-10-CM

## 2018-02-26 LAB — RENAL FUNCTION PANEL
ALBUMIN: 1.8 g/dL — AB (ref 3.5–5.0)
Anion gap: 16 — ABNORMAL HIGH (ref 5–15)
BUN: 147 mg/dL — AB (ref 8–23)
CO2: 20 mmol/L — ABNORMAL LOW (ref 22–32)
CREATININE: 4.39 mg/dL — AB (ref 0.44–1.00)
Calcium: 8.6 mg/dL — ABNORMAL LOW (ref 8.9–10.3)
Chloride: 91 mmol/L — ABNORMAL LOW (ref 98–111)
GFR calc Af Amer: 10 mL/min — ABNORMAL LOW (ref >60)
GFR calc non Af Amer: 8 mL/min — ABNORMAL LOW (ref >60)
Glucose, Bld: 174 mg/dL — ABNORMAL HIGH (ref 70–99)
Phosphorus: 4.8 mg/dL — ABNORMAL HIGH (ref 2.5–4.6)
Potassium: 4.4 mmol/L (ref 3.5–5.1)
Sodium: 127 mmol/L — ABNORMAL LOW (ref 135–145)

## 2018-02-26 LAB — GLUCOSE, CAPILLARY
Glucose-Capillary: 157 mg/dL — ABNORMAL HIGH (ref 70–99)
Glucose-Capillary: 193 mg/dL — ABNORMAL HIGH (ref 70–99)
Glucose-Capillary: 200 mg/dL — ABNORMAL HIGH (ref 70–99)

## 2018-02-26 LAB — CBC
HCT: 27.5 % — ABNORMAL LOW (ref 36.0–46.0)
Hemoglobin: 8.5 g/dL — ABNORMAL LOW (ref 12.0–15.0)
MCH: 29.8 pg (ref 26.0–34.0)
MCHC: 30.9 g/dL (ref 30.0–36.0)
MCV: 96.5 fL (ref 80.0–100.0)
PLATELETS: 246 10*3/uL (ref 150–400)
RBC: 2.85 MIL/uL — ABNORMAL LOW (ref 3.87–5.11)
RDW: 15.9 % — ABNORMAL HIGH (ref 11.5–15.5)
WBC: 5.9 10*3/uL (ref 4.0–10.5)
nRBC: 0.3 % — ABNORMAL HIGH (ref 0.0–0.2)

## 2018-02-26 LAB — CREATININE, SERUM
Creatinine, Ser: 4.36 mg/dL — ABNORMAL HIGH (ref 0.44–1.00)
GFR calc Af Amer: 10 mL/min — ABNORMAL LOW (ref >60)
GFR calc non Af Amer: 8 mL/min — ABNORMAL LOW (ref >60)

## 2018-02-26 LAB — MRSA PCR SCREENING: MRSA by PCR: POSITIVE — AB

## 2018-02-26 MED ORDER — ACETAMINOPHEN 325 MG PO TABS
650.0000 mg | ORAL_TABLET | Freq: Four times a day (QID) | ORAL | Status: DC | PRN
  Administered 2018-02-28 (×2): 650 mg via ORAL
  Filled 2018-02-26 (×2): qty 2

## 2018-02-26 MED ORDER — CLOPIDOGREL BISULFATE 75 MG PO TABS: 75.0000 mg | ORAL_TABLET | Freq: Every day | ORAL | Status: DC

## 2018-02-26 MED ORDER — METOPROLOL TARTRATE 25 MG PO TABS
25.0000 mg | ORAL_TABLET | Freq: Two times a day (BID) | ORAL | Status: DC
  Filled 2018-02-26: qty 1

## 2018-02-26 MED ORDER — FERROUS SULFATE 300 (60 FE) MG/5ML PO SYRP: 300.0000 mg | ORAL_SOLUTION | Freq: Every day | ORAL | Status: DC

## 2018-02-26 MED ORDER — IPRATROPIUM BROMIDE 0.02 % IN SOLN: 0.5000 mg | Freq: Four times a day (QID) | RESPIRATORY_TRACT | Status: AC | PRN

## 2018-02-26 MED ORDER — FREE WATER
30.0000 mL | Freq: Three times a day (TID) | Status: DC
  Administered 2018-02-26: 30 mL

## 2018-02-26 MED ORDER — FREE WATER
20.0000 mL | Status: DC
  Administered 2018-02-26 – 2018-04-03 (×182): 20 mL

## 2018-02-26 MED ORDER — COLLAGENASE 250 UNIT/GM EX OINT: TOPICAL_OINTMENT | Freq: Every day | CUTANEOUS | Status: DC

## 2018-02-26 MED ORDER — PROMETHAZINE HCL 25 MG/ML IJ SOLN
6.2500 mg | Freq: Four times a day (QID) | INTRAMUSCULAR | Status: DC | PRN
  Administered 2018-03-01 – 2018-03-06 (×6): 6.25 mg via INTRAVENOUS
  Filled 2018-02-26 (×6): qty 1

## 2018-02-26 MED ORDER — B COMPLEX-C PO TABS
1.0000 | ORAL_TABLET | Freq: Every day | ORAL | Status: DC
  Administered 2018-02-26 – 2018-04-02 (×33): 1
  Filled 2018-02-26 (×37): qty 1

## 2018-02-26 MED ORDER — ATORVASTATIN CALCIUM 40 MG PO TABS: 40.0000 mg | ORAL_TABLET | Freq: Every day | ORAL | Status: DC

## 2018-02-26 MED ORDER — HEPARIN SODIUM (PORCINE) 5000 UNIT/ML IJ SOLN
5000.0000 [IU] | Freq: Three times a day (TID) | INTRAMUSCULAR | Status: DC
  Administered 2018-03-02: 5000 [IU] via SUBCUTANEOUS
  Filled 2018-02-26 (×4): qty 1

## 2018-02-26 MED ORDER — PATIROMER SORBITEX CALCIUM 8.4 G PO PACK
8.4000 g | PACK | Freq: Every day | ORAL | Status: DC
  Administered 2018-02-27 – 2018-02-28 (×2): 8.4 g via ORAL
  Filled 2018-02-26 (×2): qty 1

## 2018-02-26 MED ORDER — ACETAMINOPHEN 160 MG/5ML PO SOLN: 500.0000 mg | Freq: Four times a day (QID) | ORAL | Status: AC | PRN

## 2018-02-26 MED ORDER — FAMOTIDINE 10 MG PO TABS: 10.0000 mg | ORAL_TABLET | Freq: Every day | ORAL | Status: AC

## 2018-02-26 MED ORDER — ALBUTEROL SULFATE (2.5 MG/3ML) 0.083% IN NEBU
2.5000 mg | INHALATION_SOLUTION | RESPIRATORY_TRACT | Status: DC | PRN
  Administered 2018-03-06: 2.5 mg via RESPIRATORY_TRACT
  Filled 2018-02-26: qty 3

## 2018-02-26 MED ORDER — ALBUTEROL SULFATE (2.5 MG/3ML) 0.083% IN NEBU: 2.5000 mg | INHALATION_SOLUTION | RESPIRATORY_TRACT | Status: AC | PRN

## 2018-02-26 MED ORDER — ADULT MULTIVITAMIN LIQUID CH: 15.0000 mL | Freq: Every day | ORAL | Status: DC

## 2018-02-26 MED ORDER — COLLAGENASE 250 UNIT/GM EX OINT
TOPICAL_OINTMENT | Freq: Every day | CUTANEOUS | Status: DC
  Administered 2018-02-27 – 2018-03-04 (×6): via TOPICAL
  Administered 2018-03-06: 1 via TOPICAL
  Administered 2018-03-07 – 2018-03-16 (×10): via TOPICAL
  Administered 2018-03-17: 1 via TOPICAL
  Administered 2018-03-18 – 2018-03-31 (×13): via TOPICAL
  Administered 2018-04-01: 1 via TOPICAL
  Administered 2018-04-02 – 2018-04-03 (×2): via TOPICAL
  Filled 2018-02-26 (×4): qty 30

## 2018-02-26 MED ORDER — MELATONIN 5 MG PO TABS
2.5000 mg | ORAL_TABLET | Freq: Every evening | ORAL | Status: DC | PRN
  Administered 2018-02-26 – 2018-03-19 (×8): 2.5 mg via ORAL
  Filled 2018-02-26 (×10): qty 0.5

## 2018-02-26 MED ORDER — NEPRO/CARBSTEADY PO LIQD: 1000.0000 mL | ORAL | 0 refills | Status: DC

## 2018-02-26 MED ORDER — OSMOLITE 1.5 CAL PO LIQD
1000.0000 mL | ORAL | Status: DC
  Administered 2018-02-27: 1000 mL

## 2018-02-26 MED ORDER — ADULT MULTIVITAMIN W/MINERALS CH
1.0000 | ORAL_TABLET | Freq: Every day | ORAL | Status: DC
  Administered 2018-02-27: 1
  Filled 2018-02-26: qty 1

## 2018-02-26 MED ORDER — ISOSORB DINITRATE-HYDRALAZINE 20-37.5 MG PO TABS: 0.5000 | ORAL_TABLET | Freq: Three times a day (TID) | ORAL | Status: DC

## 2018-02-26 MED ORDER — MELATONIN 3 MG PO TABS: 3.0000 mg | ORAL_TABLET | Freq: Every evening | ORAL | 0 refills | Status: DC | PRN

## 2018-02-26 MED ORDER — PROMETHAZINE HCL 25 MG/ML IJ SOLN: 6.2500 mg | Freq: Four times a day (QID) | INTRAMUSCULAR | Status: DC | PRN

## 2018-02-26 MED ORDER — IPRATROPIUM BROMIDE 0.02 % IN SOLN: 0.5000 mg | Freq: Four times a day (QID) | RESPIRATORY_TRACT | Status: DC | PRN

## 2018-02-26 MED ORDER — CHLORHEXIDINE GLUCONATE 0.12 % MT SOLN: 15.0000 mL | Freq: Two times a day (BID) | OROMUCOSAL | Status: AC

## 2018-02-26 MED ORDER — ISOSORB DINITRATE-HYDRALAZINE 20-37.5 MG PO TABS
0.5000 | ORAL_TABLET | Freq: Three times a day (TID) | ORAL | Status: DC
  Filled 2018-02-26 (×5): qty 0.5

## 2018-02-26 MED ORDER — ONDANSETRON HCL 4 MG/2ML IJ SOLN
4.0000 mg | Freq: Three times a day (TID) | INTRAMUSCULAR | Status: DC | PRN
  Administered 2018-03-04 – 2018-03-15 (×2): 4 mg via INTRAVENOUS
  Filled 2018-02-26 (×3): qty 2

## 2018-02-26 MED ORDER — NITROGLYCERIN 0.4 MG SL SUBL: 0.4000 mg | SUBLINGUAL_TABLET | SUBLINGUAL | Status: DC | PRN

## 2018-02-26 MED ORDER — ADULT MULTIVITAMIN LIQUID CH: 15.0000 mL | Freq: Every day | ORAL | Status: AC

## 2018-02-26 MED ORDER — FAMOTIDINE 20 MG PO TABS
10.0000 mg | ORAL_TABLET | Freq: Every day | ORAL | Status: DC
  Administered 2018-02-27 – 2018-04-03 (×33): 10 mg
  Filled 2018-02-26 (×35): qty 1

## 2018-02-26 MED ORDER — PATIROMER SORBITEX CALCIUM 8.4 G PO PACK: 8.4000 g | PACK | Freq: Every day | ORAL | Status: DC

## 2018-02-26 MED ORDER — ORAL CARE MOUTH RINSE: 15.0000 mL | Freq: Two times a day (BID) | OROMUCOSAL | Status: AC

## 2018-02-26 MED ORDER — NITROGLYCERIN 0.4 MG SL SUBL: 0.4000 mg | SUBLINGUAL_TABLET | SUBLINGUAL | Status: AC | PRN

## 2018-02-26 MED ORDER — ACETAMINOPHEN 650 MG RE SUPP
650.0000 mg | Freq: Four times a day (QID) | RECTAL | Status: DC | PRN
  Administered 2018-03-01: 650 mg via RECTAL
  Filled 2018-02-26: qty 1

## 2018-02-26 MED ORDER — ATORVASTATIN CALCIUM 20 MG PO TABS
40.0000 mg | ORAL_TABLET | Freq: Every day | ORAL | Status: DC
  Administered 2018-02-28 – 2018-04-02 (×30): 40 mg
  Filled 2018-02-26 (×31): qty 2

## 2018-02-26 MED ORDER — METOPROLOL TARTRATE 25 MG PO TABS: 25.0000 mg | ORAL_TABLET | Freq: Two times a day (BID) | ORAL | Status: DC

## 2018-02-26 MED ORDER — CLOPIDOGREL BISULFATE 75 MG PO TABS
75.0000 mg | ORAL_TABLET | Freq: Every day | ORAL | Status: DC
  Administered 2018-02-27 – 2018-03-01 (×3): 75 mg
  Filled 2018-02-26 (×3): qty 1

## 2018-02-26 MED ORDER — ONDANSETRON HCL 4 MG/2ML IJ SOLN: 4.0000 mg | Freq: Three times a day (TID) | INTRAMUSCULAR | Status: DC | PRN

## 2018-02-26 MED ORDER — FREE WATER: 30.0000 mL | Freq: Three times a day (TID) | Status: AC

## 2018-02-26 MED ORDER — CHOLECALCIFEROL 10 MCG (400 UNIT) PO TABS
400.0000 [IU] | ORAL_TABLET | Freq: Every day | ORAL | Status: DC
  Administered 2018-02-27 – 2018-03-01 (×3): 400 [IU]
  Filled 2018-02-26 (×3): qty 1

## 2018-02-26 MED ORDER — FERROUS SULFATE 325 (65 FE) MG PO TABS
325.0000 mg | ORAL_TABLET | Freq: Every day | ORAL | Status: DC
  Administered 2018-02-27 – 2018-03-01 (×2): 325 mg via ORAL
  Filled 2018-02-26 (×2): qty 1

## 2018-02-26 MED ORDER — CHOLECALCIFEROL 10 MCG (400 UNIT) PO TABS: 400.0000 [IU] | ORAL_TABLET | Freq: Every day | ORAL | Status: AC

## 2018-02-26 NOTE — Plan of Care (Signed)
  Problem: Nutrition: Goal: Risk of aspiration will decrease Outcome: Progressing   Problem: Coping: Goal: Level of anxiety will decrease Outcome: Progressing   Problem: Clinical Measurements: Goal: Ability to maintain clinical measurements within normal limits will improve Outcome: Progressing

## 2018-02-26 NOTE — Op Note (Signed)
  OPERATIVE NOTE   PROCEDURE: 1. Ultrasound guidance for vascular access right femoral vein 2. Placement of triple lumen dialysis catheter right femoral vein  PRE-OPERATIVE DIAGNOSIS: 1. Renal failure  POST-OPERATIVE DIAGNOSIS: Same  SURGEON: Leotis Pain, MD  ASSISTANT(S): None  ANESTHESIA: local  ESTIMATED BLOOD LOSS: Minimal   FINDING(S): 1. None  SPECIMEN(S): None  INDICATIONS:  Patient is a 85 y.o.female who presents with renal failure.  The nephrology service has decided to initiate dialysis and we are asked by them to place a catheter for immediate dialysis.  Risks and benefits were discussed, and informed consent was obtained..  DESCRIPTION: After obtaining full informed written consent, the patient was laid flat in the bed. The right groin was sterilely prepped and draped in a sterile surgical field was created. The right femoral vein was visualized with ultrasound and found to be widely patent. It was then accessed under direct guidance without difficulty with a Seldinger needle and a permanent image was recorded. A J-wire was then placed. After skin nick and dilatation, a three lumen dialysis catheter was placed over the wire and the wire was removed. The lumens withdrew dark red nonpulsatile blood and flushed easily with sterile saline. The catheter was secured to the skin with 3 nylon sutures. Sterile dressing was placed.  COMPLICATIONS: None  CONDITION: Stable  Leotis Pain 02/26/2018 5:11 PM  This note was created with Dragon Medical transcription system. Any errors in dictation are purely unintentional.

## 2018-02-26 NOTE — Consult Note (Signed)
Date: 02/26/2018                  Patient Name:  Jaleesa Cervi  MRN: [id number]  DOB: 24-Jul-1927  Age / Sex: 85 y.o., female         PCP: Deland Pretty, MD                 Service Requesting Consult: IM/ Fritzi Mandes, MD                 Reason for Consult:  Acute renal failure            History of Present Illness: Patient is a 85 y.o. female with medical problems of chronic kidney disease stage III, systolic CHF, diabetes type 2, tardive dyskinesia, stroke, who was admitted to [facility] on 02/26/2018  Patient was originally admitted to [facility] in [place] for stuttering, dysarthria, a chalazia with 50 pound weight loss and UTI.  According to H&P, patient was treated with Botox earlier in October and sent to SNF.  Patient has not done well since then.  PEG tube was placed.  Hospital course was complicated by non-STEMI, acute systolic and diastolic CHF, acute respiratory failure requiring intubation from December 2 to December 6, UTI and pneumonia.  Also found to have new cerebral infarcts.  Patient developed acute kidney injury during the hospital course.  Nephrology team felt that patient had poor prognosis and deemed not a candidate for chronic dialysis.  Family was persistent and wanted aggressive care.  Therefore she is transferred to [facility] for a trial of short course of dialysis to see if her clinical condition can be improved.   Medications: Outpatient medications: Medications Prior to Admission  Medication Sig Dispense Refill Last Dose  . acetaminophen (TYLENOL) 160 MG/5ML solution Place 15.6 mLs (500 mg total) into feeding tube every 6 (six) hours as needed for mild pain or fever.     Marland Kitchen albuterol (PROVENTIL) (2.5 MG/3ML) 0.083% nebulizer solution Take 3 mLs (2.5 mg total) by nebulization every 4 (four) hours as needed for wheezing or shortness of breath.     Marland Kitchen atorvastatin (LIPITOR) 40 MG tablet Place 1 tablet (40 mg total) into feeding tube daily at 6 PM.     .  chlorhexidine (PERIDEX) 0.12 % solution 15 mLs by Mouth Rinse route 2 (two) times daily.     Derrill Memo ON 02/27/2018] cholecalciferol (VITAMIN D3) 10 MCG (400 UNIT) TABS tablet Place 1 tablet (400 Units total) into feeding tube daily.     Derrill Memo ON 02/27/2018] clopidogrel (PLAVIX) 75 MG tablet Place 1 tablet (75 mg total) into feeding tube daily.     Derrill Memo ON 02/27/2018] collagenase (SANTYL) ointment Apply topically daily.     Derrill Memo ON 02/27/2018] famotidine (PEPCID) 10 MG tablet Place 1 tablet (10 mg total) into feeding tube daily.     Derrill Memo ON 02/27/2018] ferrous sulfate 300 (60 Fe) MG/5ML syrup Place 5 mLs (300 mg total) into feeding tube daily.     Marland Kitchen ipratropium (ATROVENT) 0.02 % nebulizer solution Take 2.5 mLs (0.5 mg total) by nebulization every 6 (six) hours as needed for wheezing or shortness of breath.     . isosorbide-hydrALAZINE (BIDIL) 20-37.5 MG tablet Place 0.5 tablets into feeding tube 3 (three) times daily.     . Melatonin 3 MG TABS Take 1 tablet (3 mg total) by mouth at bedtime as needed (insomnia).  0   . metoprolol tartrate (LOPRESSOR) 25  MG tablet Place 1 tablet (25 mg total) into feeding tube 2 (two) times daily.     Marland Kitchen mouth rinse LIQD solution 15 mLs by Mouth Rinse route 2 times daily at 12 noon and 4 pm.     . Derrill Memo ON 02/27/2018] Multiple Vitamin (MULTIVITAMIN) LIQD Place 15 mLs into feeding tube daily.     . nitroGLYCERIN (NITROSTAT) 0.4 MG SL tablet Place 1 tablet (0.4 mg total) under the tongue every 5 (five) minutes x 3 doses as needed for chest pain.     . Nutritional Supplements (FEEDING SUPPLEMENT, NEPRO CARB STEADY,) LIQD Place 1,000 mLs into feeding tube daily.  0   . ondansetron (ZOFRAN) 4 MG/2ML SOLN injection Inject 2 mLs (4 mg total) into the vein every 8 (eight) hours as needed for nausea or vomiting (nausea, vomiting).     Derrill Memo ON 02/27/2018] patiromer (VELTASSA) 8.4 g packet Take 1 packet (8.4 g total) by mouth daily.     . promethazine (PHENERGAN) 25  MG/ML injection Inject 0.25 mLs (6.25 mg total) into the vein every 6 (six) hours as needed for nausea or vomiting.     . Water For Irrigation, Sterile (FREE WATER) SOLN Place 30 mLs into feeding tube every 8 (eight) hours.       Current medications: No current facility-administered medications for this encounter.       Allergies: Allergies  Allergen Reactions  . Contrast Media [Iodinated Diagnostic Agents]     Sensitivity, kidney issues      Past Medical History: Past Medical History:  Diagnosis Date  . Acute renal injury ([place]) 07/2016  . Anginal pain ([place]) 2004; 2011  . CAD (coronary artery disease)    status post stenting of the RCA status post CABG. (left internal mammary artery to  LAD, saphenous vein graft to second diagonal, saphenous vein  graft to obtuse marginal 1, saphenous vein graft to posterior  descending).  . Cardiomyopathy     Mildly reduced EF (45%).   . Chronic kidney disease (CKD), stage III (moderate) ([place])   . History of blood transfusion 1960s   "related to anemia"  . HTN (hypertension)   . Pleural effusion 2011   S/P "bypass"  . Shortness of breath   . Stroke ([place]) 09/19/2016   "left sided weakness" (09/20/2016)  . Type II diabetes mellitus ([place])      Past Surgical History: Past Surgical History:  Procedure Laterality Date  . BOTOX INJECTION  11/20/2017   Procedure: BOTOX INJECTION;  Surgeon: Doran Stabler, MD;  Location: [facility];  Service: Gastroenterology;;  . CATARACT EXTRACTION W/ INTRAOCULAR LENS  IMPLANT, BILATERAL Bilateral   . CORONARY ANGIOPLASTY WITH STENT PLACEMENT  2004   "LAD & one of the circumflex"  . CORONARY ARTERY BYPASS GRAFT  2011   CABG X4  . ESOPHAGOGASTRODUODENOSCOPY (EGD) WITH PROPOFOL N/A 11/20/2017   Procedure: ESOPHAGOGASTRODUODENOSCOPY (EGD) WITH PROPOFOL;  Surgeon: Doran Stabler, MD;  Location: [facility];  Service: Gastroenterology;  Laterality: N/A;  . IR GASTROSTOMY TUBE MOD SED  12/10/2017      Family History: Family History  Problem Relation Age of Onset  . Diabetes Father 12  . Unexplained death Mother 49  . CAD Neg Hx      Social History: Social History   Socioeconomic History  . Marital status: Married    Spouse name: Not on file  . Number of children: 2  . Years of education: Not on file  . Highest education  level: Not on file  Occupational History  . Occupation: Retired  Scientific laboratory technician  . Financial resource strain: Not on file  . Food insecurity:    Worry: Not on file    Inability: Not on file  . Transportation needs:    Medical: Not on file    Non-medical: Not on file  Tobacco Use  . Smoking status: Never Smoker  . Smokeless tobacco: Never Used  Substance and Sexual Activity  . Alcohol use: No  . Drug use: No  . Sexual activity: Not Currently  Lifestyle  . Physical activity:    Days per week: Not on file    Minutes per session: Not on file  . Stress: Not on file  Relationships  . Social connections:    Talks on phone: Not on file    Gets together: Not on file    Attends religious service: Not on file    Active member of club or organization: Not on file    Attends meetings of clubs or organizations: Not on file    Relationship status: Not on file  . Intimate partner violence:    Fear of current or ex partner: Not on file    Emotionally abused: Not on file    Physically abused: Not on file    Forced sexual activity: Not on file  Other Topics Concern  . Not on file  Social History Narrative  . Not on file     Review of Systems: Not available Gen:  HEENT:  CV:  Resp:  GI: GU :  MS:  Derm:   Psych: Heme:  Neuro:  Endocrine  Vital Signs: Temperature (!) 96.2 F (35.7 C), temperature source Rectal.  No intake or output data in the 24 hours ending 02/26/18 1534  Weight trends: There were no vitals filed for this visit.  Physical Exam: General:  Frail, elderly, laying in the bed  HEENT  dry oral mucous membranes   Neck:  Distended neck veins  Lungs:  Bilateral diffuse crackles  Heart::  Irregular rhythm  Abdomen:  Soft, PEG tube in place, exit site clean,  Extremities:  2-3+ pitting edema up to lower abdomen  Neurologic:  Lethargic, able to follow few basic simple commands  Skin:  No acute rashes  Access:  To be placed  Foley:  In place       Lab results: Basic Metabolic Panel: Recent Labs  Lab 02/24/18 0606 02/25/18 0500 02/26/18 0500  NA 128* 129* 127*  K 4.2 4.3 4.4  CL 93* 93* 91*  CO2 22 21* 20*  GLUCOSE 130* 144* 174*  BUN 149* 150* 147*  CREATININE 4.35* 4.48* 4.39*  CALCIUM 8.7* 8.5* 8.6*  PHOS 5.2* 5.1* 4.8*    Liver Function Tests: Recent Labs  Lab 02/26/18 0500  ALBUMIN 1.8*   No results for input(s): LIPASE, AMYLASE in the last 168 hours. No results for input(s): AMMONIA in the last 168 hours.  CBC: Recent Labs  Lab 02/20/18 0420 02/21/18 0433  WBC 8.1 5.5  HGB 7.3* 8.0*  HCT 23.6* 26.0*  MCV 95.5 95.2  PLT 249 207    Cardiac Enzymes: No results for input(s): CKTOTAL, TROPONINI in the last 168 hours.  BNP: Invalid input(s): POCBNP  CBG: Recent Labs  Lab 02/25/18 1936 02/25/18 2342 02/26/18 0617 02/26/18 0825 02/26/18 1200  GLUCAP 169* 177* 157* 193* 200*    Microbiology: No results found for this or any previous visit (from the past 720 hour(s)).   Coagulation  Studies: No results for input(s): LABPROT, INR in the last 72 hours.  Urinalysis: No results for input(s): COLORURINE, LABSPEC, PHURINE, GLUCOSEU, HGBUR, BILIRUBINUR, KETONESUR, PROTEINUR, UROBILINOGEN, NITRITE, LEUKOCYTESUR in the last 72 hours.  Invalid input(s): APPERANCEUR      Imaging:  No results found.   Assessment & Plan: Pt is a 85 y.o. African-American  female with multiple comorbidities including diabetes type 2, hypertension, hyperlipidemia, coronary disease status post CABG, history of stroke, CKD, was admitted on 02/26/2018 with Achalasia and 50 pound  weight loss, dysarthria [facility].   1.  Acute kidney injury on chronic kidney disease stage III.  Baseline creatinine probably 1.5 2.  Malnutrition causing weight loss, now has G-tube 3.  Anasarca and pulmonary edema 4.  Chronic systolic congestive heart failure EF 30%.  CAD status post CABG 5.  Stroke with left-sided weakness 6.  Diabetes type 2 with CKD  Plan: Patient was transferred to [facility] for a trial of dialysis.  We have discussed with patient's son and daughter who are power of attorney, to give a 2-week trial of dialysis to see if her clinical condition can be improved.  Family understands that due to multiple comorbidities, patient's health is very fragile and dialysis could possibly cause more instability from volume shifts.  Family understands and consents to proceed.  Discussed DNR.  Patient son stated that due to the patient's religious believes, she did not want to be a DNR and he wants to respect her wishes. Consult to vascular surgery has been placed and Dr. Lucky Cowboy has been notified for femoral temporary dialysis cathter placement. Case d/w Dr Mayer Masker (son) and patient's daughter, Dr Serita Grit (attending).      LOS: 0 Jerimah Witucki 1/27/20203:34 PM  [place], [place]  Note: This note was prepared with Dragon dictation. Any transcription errors are unintentional

## 2018-02-26 NOTE — Consult Note (Signed)
[facility] Vascular Consult Note  MRN : [id number]  [name] is a 85 y.o. (26-Aug-1927) female who presents with chief complaint of multiple medical issues now with acute kidney injury superimposed on CKD stage III  History of Present Illness: Patient is admitted to the hospital with acute kidney injury superimposed on CKD stage III, complicated by systolic CHF, poor nutrition, acute metabolic encephalopathy, acute on chronic combined systolic, diastolic CHF, moderate MR, moderate RV systolic dysfunction, bilateral pleural effusions, anasarca, hypoalbuminemia, dysphagia secondary to achalasia, failed Botox, moderate malnutrition, diabetes mellitus type 2, A1c 5.9, acute on chronic anemia, anemia of chronic disease, tardive dyskinesia, ESBL Klebsiella aspiration pneumonia, enterococcus UTI, NSTEMI, left temporal and frontal punctate CVA Lymph node within umbilical hernia.  Patient was recently admitted to [facility] and underwent multiple medical procedures with unfortunately an unfavorable outcome.  The family requested transfer to a tertiary care center for continued care however both [place] refused.  The patient is unable to make her own medical decisions due to the severity of her medical issues however her family continue to desire full aggressive care including hemodialysis. The patients children are her POA.   The nephrology and internal medicine service has decided to initiate dialysis at this time, and we are asked to place a temporary dialysis catheter for immediate dialysis use.    Vascular surgery was consulted by Dr. Candiss Norse for insertion of a temporary dialysis catheter.  No current facility-administered medications for this encounter.    Past Medical History:  Diagnosis Date  . Acute renal injury ([place]) 07/2016  . Anginal pain ([place]) 2004; 2011  . CAD (coronary artery disease)    status post stenting of the RCA status post CABG. (left  internal mammary artery to  LAD, saphenous vein graft to second diagonal, saphenous vein  graft to obtuse marginal 1, saphenous vein graft to posterior  descending).  . Cardiomyopathy     Mildly reduced EF (45%).   . Chronic kidney disease (CKD), stage III (moderate) ([place])   . History of blood transfusion 1960s   "related to anemia"  . HTN (hypertension)   . Pleural effusion 2011   S/P "bypass"  . Shortness of breath   . Stroke ([place]) 09/19/2016   "left sided weakness" (09/20/2016)  . Type II diabetes mellitus ([place])    Past Surgical History:  Procedure Laterality Date  . BOTOX INJECTION  11/20/2017   Procedure: BOTOX INJECTION;  Surgeon: Doran Stabler, MD;  Location: [facility];  Service: Gastroenterology;;  . CATARACT EXTRACTION W/ INTRAOCULAR LENS  IMPLANT, BILATERAL Bilateral   . CORONARY ANGIOPLASTY WITH STENT PLACEMENT  2004   "LAD & one of the circumflex"  . CORONARY ARTERY BYPASS GRAFT  2011   CABG X4  . ESOPHAGOGASTRODUODENOSCOPY (EGD) WITH PROPOFOL N/A 11/20/2017   Procedure: ESOPHAGOGASTRODUODENOSCOPY (EGD) WITH PROPOFOL;  Surgeon: Doran Stabler, MD;  Location: [facility];  Service: Gastroenterology;  Laterality: N/A;  . IR GASTROSTOMY TUBE MOD SED  12/10/2017   Social History Social History   Tobacco Use  . Smoking status: Never Smoker  . Smokeless tobacco: Never Used  Substance Use Topics  . Alcohol use: No  . Drug use: No   Family History Family History  Problem Relation Age of Onset  . Diabetes Father 82  . Unexplained death Mother 61  . CAD Neg Hx   The patient denies any family history of peripheral artery disease, venous disease or bleeding/clotting disorders.  Allergies  Allergen Reactions  . Contrast Media [Iodinated Diagnostic Agents]     Sensitivity, kidney issues   REVIEW OF SYSTEMS (Negative unless checked)  Unable to obtain the review of systems due to the patient's severe systemic illness and altered mental status. Information  was obtained from previous Epic notes.   Constitutional: [x] Weight loss  [] Fever  [] Chills  Cardiac: [] Chest pain   [] Chest pressure   [] Palpitations   [] Shortness of breath when laying flat   [] Shortness of breath at rest   [] Shortness of breath with exertion. Vascular:  [] Pain in legs with walking   [] Pain in legs at rest   [] Pain in legs when laying flat   [] Claudication   [] Pain in feet when walking  [] Pain in feet at rest  [] Pain in feet when laying flat   [] History of DVT   [] Phlebitis   [x] Swelling in legs   [] Varicose veins   [] Non-healing ulcers Pulmonary:   [] Uses home oxygen   [] Productive cough   [] Hemoptysis   [] Wheeze  [] COPD   [] Asthma Neurologic:  [] Dizziness  [] Blackouts   [] Seizures   [x] History of stroke   [] History of TIA  [] Aphasia   [] Temporary blindness   [x] Dysphagia   [x] Weakness or numbness in arms   [x] Weakness or numbness in legs Musculoskeletal:  [] Arthritis   [] Joint swelling   [] Joint pain   [] Low back pain Hematologic:  [] Easy bruising  [] Easy bleeding   [] Hypercoagulable state   [x] Anemic  [] Hepatitis Gastrointestinal:  [] Blood in stool   [] Vomiting blood  [] Gastroesophageal reflux/heartburn   [] Difficulty swallowing. Genitourinary:  [x] Chronic kidney disease   [] Difficult urination  [] Frequent urination  [] Burning with urination   [] Blood in urine Skin:  [] Rashes   [] Ulcers   [] Wounds  Psychological:  [] History of anxiety   []  History of major depression.  Physical Examination  Vitals:   02/26/18 1351 02/26/18 1624  BP:  (!) 119/51  Pulse:  71  Resp:  16  Temp: (!) 96.2 F (35.7 C) (!) 97.5 F (36.4 C)  TempSrc: Rectal Oral   There is no height or weight on file to calculate BMI.  Gen: Non-responsive Head: [place]/AT, No temporalis wasting Ear/Nose/Throat: Nares w/o erythema or drainage Eyes: Sclera non-icteric, conjunctiva clear Neck: Supple, no nuchal rigidity.    Pulmonary:  Good air movement, clear to auscultation bilaterally.  Cardiac: RRR, normal  S1, S2, no Murmurs, rubs or gallops. Vascular: Extremities warm  Gastrointestinal: soft, non-tender/non-distended. No guarding/reflex.  Musculoskeletal: Extremities without ischemic changes.  No deformity or atrophy. Edema in the lower extremities bilaterally Psychiatric: Difficult to assess due to the severity of patient's illness. Dermatologic: No rashes or ulcers noted.   Lymph : No Cervical, Axillary, or Inguinal lymphadenopathy.  CBC Lab Results  Component Value Date   WBC 5.5 02/21/2018   HGB 8.0 (L) 02/21/2018   HCT 26.0 (L) 02/21/2018   MCV 95.2 02/21/2018   PLT 207 02/21/2018   BMET    Component Value Date/Time   NA 127 (L) 02/26/2018 0500   K 4.4 02/26/2018 0500   CL 91 (L) 02/26/2018 0500   CO2 20 (L) 02/26/2018 0500   GLUCOSE 174 (H) 02/26/2018 0500   BUN 147 (H) 02/26/2018 0500   CREATININE 4.39 (H) 02/26/2018 0500   CALCIUM 8.6 (L) 02/26/2018 0500   CALCIUM 10.2 11/23/2017 0910   GFRNONAA 8 (L) 02/26/2018 0500   GFRAA 10 (L) 02/26/2018 0500   Estimated Creatinine Clearance: 9.3 mL/min (A) (by C-G formula based on  SCr of 4.39 mg/dL (H)).  COAG Lab Results  Component Value Date   INR 1.16 12/07/2017   INR 1.02 11/08/2017   INR 0.92 02/09/2017   Radiology Ct Abdomen Pelvis Wo Contrast  Result Date: 02/09/2018 CLINICAL DATA:  Lymph node within umbilical hernia. EXAM: CT ABDOMEN AND PELVIS WITHOUT CONTRAST TECHNIQUE: Multidetector CT imaging of the abdomen and pelvis was performed following the standard protocol without IV contrast. Exam is somewhat limited due to persistent patient motion. COMPARISON:  CT scan of November 04, 2017. FINDINGS: Lower chest: Moderate bilateral pleural effusions are noted with adjacent subsegmental atelectasis. Hepatobiliary: No focal liver abnormality is seen. No gallstones, gallbladder wall thickening, or biliary dilatation. Pancreas: Unremarkable. No pancreatic ductal dilatation or surrounding inflammatory changes. Spleen: Normal in  size without focal abnormality. Adrenals/Urinary Tract: Adrenal glands and kidneys are unremarkable. No hydronephrosis or renal obstruction is noted. No renal or ureteral calculi are noted. Vascular calcifications are noted in both kidneys. Urinary bladder is decompressed secondary to Foley catheter. Stomach/Bowel: Gastrostomy tube is well position within distal stomach. There is no evidence of bowel obstruction. Stool is noted throughout the colon. The appendix is not clearly visualized. Vascular/Lymphatic: Atherosclerosis of abdominal aorta is noted without aneurysm formation. The lymph node noted within the umbilical hernia on prior exam has significantly increased in size, now measuring 2.6 x 2.5 cm. This is concerning for malignancy. Reproductive: Enlarged fibroid uterus is noted. No definite adnexal abnormality is noted. Other: Moderate anasarca is noted. Musculoskeletal: Multilevel degenerative disc disease is noted in the lumbar spine. No acute abnormality is noted. IMPRESSION: Lymph node noted within umbilical hernia on prior exam has significantly increased in size, now measuring 2.6 cm in maximum diameter. This is highly concerning for malignancy and tissue sampling is recommended. These results will be called to the ordering clinician or representative by the Radiologist Assistant, and communication documented in the PACS or zVision Dashboard. Moderate bilateral pleural effusions are noted with adjacent subsegmental atelectasis. Moderate anasarca. Enlarged fibroid uterus. Gastrostomy tube is noted. Aortic Atherosclerosis (ICD10-I70.0). Electronically Signed   By: Marijo Conception, M.D.   On: 02/09/2018 13:58   Dg Abd 1 View  Result Date: 02/06/2018 CLINICAL DATA:  Constipation, history coronary artery disease, stroke, cardiomyopathy, stage III chronic kidney disease, hypertension, type II diabetes mellitus EXAM: ABDOMEN - 1 VIEW COMPARISON:  01/23/2018 FINDINGS: Gastrostomy tube projects over stomach.  Normal bowel gas pattern. No bowel dilatation or bowel wall thickening. Scattered atherosclerotic calcifications. Dextroconvex thoracolumbar scoliosis with degenerative disc and facet disease changes of the lumbar spine. Bones appear demineralized. Degenerative changes of the RIGHT hip joint also noted. Rounded calcifications in the central pelvis question related to degenerated leiomyoma. IMPRESSION: Normal bowel gas pattern. Electronically Signed   By: Lavonia Dana M.D.   On: 02/06/2018 17:31   Dg Chest Port 1 View  Result Date: 02/20/2018 CLINICAL DATA:  85 year old female with vomiting. Query aspiration. Renal failure, status post NSTEMI last month, heart failure. Recent infections. Shortness of breath. EXAM: PORTABLE CHEST 1 VIEW COMPARISON:  0707 hours earlier today. FINDINGS: Portable AP semi upright view at 1007 hours. Stable right PICC line. Stable cardiomegaly and mediastinal contours. Stable veiling and confluent bibasilar opacity from earlier today, with some pleural fluid tracking in the right minor fissure. No new areas of pulmonary opacity. No pneumothorax. Stable upper lobe vascularity. A proximal right humerus deformity is chronic and stable. Paucity of bowel gas in the upper abdomen. IMPRESSION: Stable from earlier today. Bilateral pleural effusions and  lower lobe collapse or consolidation with no new cardiopulmonary abnormality. Electronically Signed   By: Genevie Ann M.D.   On: 02/20/2018 10:46   Dg Chest Port 1 View  Result Date: 02/20/2018 CLINICAL DATA:  85 year old female with renal failure, status post NSTEMI last month, heart failure. Recent infections. Shortness of breath today. EXAM: PORTABLE CHEST 1 VIEW COMPARISON:  CT Abdomen and Pelvis 02/09/2018. Portable chest 02/07/2018 and earlier. FINDINGS: Portable AP semi upright view at 0707 hours. Stable right PICC line. Stable cardiomegaly and mediastinal contours. Prior CABG. Bibasilar veiling and confluent pulmonary opacity persists,  mildly increased on the right since 02/08/2008 and fluid now tracking in the right minor fissure. Apical pulmonary vascularity appears fairly normal. No pneumothorax. Visualized tracheal air column is within normal limits. No acute osseous abnormality identified. IMPRESSION: Cardiomegaly with bilateral pleural effusions with bibasilar atelectasis or consolidation. Right pleural effusion appears mildly increased since 02/07/2018, now moderate overall. Electronically Signed   By: Genevie Ann M.D.   On: 02/20/2018 07:26   Dg Chest Port 1 View  Result Date: 02/07/2018 CLINICAL DATA:  Dyspnea EXAM: PORTABLE CHEST 1 VIEW COMPARISON:  02/01/2018, 01/13/2018 FINDINGS: Cardiomegaly with vascular congestion and moderate pulmonary edema. Likely layering bilateral pleural effusions. Slight worsening of dense bibasilar airspace disease. Right upper extremity catheter tip overlies the SVC origin. Aortic atherosclerosis. No pneumothorax. IMPRESSION: 1. Cardiomegaly with vascular congestion and continued moderate pulmonary edema. 2. Layering pleural effusions, right greater than left, likely attributing to overall hazy appearance of the lung fields. Worsening consolidation at both bases. Electronically Signed   By: Donavan Foil M.D.   On: 02/07/2018 17:05   Dg Chest Port 1 View  Result Date: 02/01/2018 CLINICAL DATA:  Acute onset shortness of breath and respiratory failure. Current history of CHF. EXAM: PORTABLE CHEST 1 VIEW COMPARISON:  01/13/2018 and earlier. FINDINGS: Prior sternotomy for CABG. Cardiac silhouette moderately enlarged, unchanged. Diffuse interstitial and airspace pulmonary edema, asymmetric and increased in the RIGHT lung, worse than on the examination 3 weeks ago. Large RIGHT pleural effusion, increased in size in the interval. Stable moderate-sized LEFT pleural effusion. Stable consolidation in the lower lobes. RIGHT arm PICC tip projects over the mid SVC. IMPRESSION: Diffuse interstitial and airspace  pulmonary edema indicating CHF, asymmetric and increased in the RIGHT lung, worse than on the examination 3 weeks ago. Large RIGHT pleural effusion, increased in size in the interval. Stable moderate-sized LEFT pleural effusion. Electronically Signed   By: Evangeline Dakin M.D.   On: 02/01/2018 12:25   Dg Abd Portable 1v  Result Date: 02/20/2018 CLINICAL DATA:  85 year old female with vomiting. Query aspiration. Renal failure, status post NSTEMI last month, heart failure. Recent infections. Shortness of breath. EXAM: PORTABLE ABDOMEN - 1 VIEW COMPARISON:  CT Abdomen and Pelvis 02/09/2018 and earlier. FINDINGS: Portable AP view at 1007 hours. Gastrostomy tube redemonstrated. The mildly increased gas in the stomach compared to the prior CT, but normal and nonobstructed bowel-gas pattern elsewhere. Aortic and iliofemoral calcified atherosclerosis. Mild-to-moderate degenerative appearing dextroconvex lumbar spine curvature. Multilevel disc and endplate degeneration. No acute osseous abnormality identified. IMPRESSION: Stable gastrostomy tube and bowel-gas pattern within normal limits. Electronically Signed   By: Genevie Ann M.D.   On: 02/20/2018 10:48   Vas [place] Lower Extremity Venous (dvt)  Result Date: 02/18/2018  Lower Venous Study Indications: Edema.  Limitations: Edema. Comparison Study: Prior negative studies from 01/05/18 and 09/22/16 are available  for comparison Performing Technologist: Sharion Dove RVS  Examination Guidelines: A complete evaluation includes B-mode imaging, spectral Doppler, color Doppler, and power Doppler as needed of all accessible portions of each vessel. Bilateral testing is considered an integral part of a complete examination. Limited examinations for reoccurring indications may be performed as noted.  Right Venous Findings: +---+---------------+---------+-----------+----------+-------+    CompressibilityPhasicitySpontaneityPropertiesSummary  +---+---------------+---------+-----------+----------+-------+ CFVFull           Yes      Yes                          +---+---------------+---------+-----------+----------+-------+  Left Venous Findings: +---------+---------------+---------+-----------+----------+------------------+          CompressibilityPhasicitySpontaneityPropertiesSummary            +---------+---------------+---------+-----------+----------+------------------+ CFV                     Yes      Yes                  visualized,                                                              pulsatile          +---------+---------------+---------+-----------+----------+------------------+ FV Prox                 Yes      Yes                  visualized,                                                              pulsatile          +---------+---------------+---------+-----------+----------+------------------+ FV Mid                  Yes      Yes                                     +---------+---------------+---------+-----------+----------+------------------+ FV Distal               Yes      Yes                                     +---------+---------------+---------+-----------+----------+------------------+ POP                     Yes      Yes                  visualized,                                                              pulsatile          +---------+---------------+---------+-----------+----------+------------------+  PTV                                                   visualized         +---------+---------------+---------+-----------+----------+------------------+ PERO                                                  visualized         +---------+---------------+---------+-----------+----------+------------------+    Summary: Right: No evidence of common femoral vein obstruction. Left: There is no evidence of deep vein thrombosis in the lower  extremity. However, portions of this examination were limited- see technologist comments above. Significant interstitial fluid noted throughout.  *See table(s) above for measurements and observations. Electronically signed by Harold Barban MD on 02/18/2018 at 5:01:59 PM.    Final    Assessment/Plan The patient is an 85 year old female with multiple medical problems/issues transferred from [facility] at the request of her's family as the patient is unable to make medical decisions on her own due to the severity of her illness with acute kidney injury superimposed on CKD stage III  1. Acute on chronic renal failure: Patient is in need of dialysis at this time.  [name] family has chosen to move forward with dialysis and therefore with the patient needs an access. We will proceed with temporary dialysis catheter placement at this time.  Risks and benefits discussed with patient family, and the catheter will be placed to allow immediate initiation of dialysis. If kidney function does not improve in the near future and long term dialysis is needed, if nephrology deems appropriate we can place a permcath.   2. Critically Ill: Will defer management to the medicine and nephrology  Discussed with Dr. Mayme Genta, PA-C  02/26/2018 4:38 PM

## 2018-02-26 NOTE — Progress Notes (Signed)
Daughter refused the tube feeding for patient. Stated she was not on Osmolite at [facility]. Sent message to RD.   Fuller Mandril, RN

## 2018-02-26 NOTE — Care Management Important Message (Signed)
Important Message  Patient Details  Name: [name] MRN: [id number] Date of Birth: [birth year]   Medicare Important Message Given:  Yes    Zenon Mayo, RN 02/26/2018, 11:50 AM

## 2018-02-26 NOTE — Discharge Summary (Signed)
Physician Discharge Summary  [name] FGH:[id number] DOB: 04-08-1927 DOA: 12/05/2017  PCP: Darlene Pretty, MD  Admit date: 12/05/2017 Discharge date: 02/26/2018   Transfer summary Accepting facility: [facility] Accepting physician: Dr. Fritzi Mandes  Discharge Diagnoses: Principal diagnosis is #1 1. Acute kidney injury superimposed on CKD stage III, complicated by systolic CHF, poor nutrition.   2. Acute metabolic encephalopathy.   3. Acute on chronic combined systolic, diastolic CHF, moderate MR, moderate RV systolic dysfunction, bilateral pleural effusions, anasarca, hypoalbuminemia. Dysphagia secondary to achalasia, failed Botox, moderate malnutrition 4. Diabetes mellitus type 2, A1c 5.9 5. Acute on chronic anemia, anemia of chronic disease 6. Tardive dyskinesia 7. ESBL Klebsiella aspiration pneumonia, enterococcus UTI 8. NSTEMI 9. Left temporal and frontal punctate CVA 10. Lymph node within umbilical hernia  Discharge Condition: stable Disposition: transfer to [place]  Diet recommendation: NPO Tube feeds  Filed Weights   02/22/18 0600 02/23/18 0420 02/24/18 0500  Weight: 79.8 kg 80.8 kg 88.5 kg    History of present illness:  85 year old woman admitted 11/9; presented with stuttering, progressive to total aphasia, admitted for dysarthria, UTI, achalasia with 50 pound weight loss.  Hospital Course:  Patient underwent PEG tube placement for dysphagia secondary to achalasia.  Later in  hospitalization suffered a NSTEMI, found to have acute systolic and diastolic CHF, suffered acute respiratory failure requiring intubation December 2-December 6, was treated for UTI and pneumonia.  Suffered new cerebral infarcts and was seen by neurology. Treated for heart failure with poor diuresis with Lasix, seen by cardiology and reportedly advanced heart failure team recommended no aggressive measures.  She subsequently developed acute kidney injury and was seen by nephrology and dialysis was  not offered.  Family requested transfer to tertiary care center but both [facility] refused.  Family continued to desire full aggressive care including hemodialysis.  Acute kidney injury has remained relatively stable over the last several days, per family request, another attempt was made to transfer to facility for consideration of hemodialysis.  She was subsequently accepted to [facility] after discussion with Drs. Patel (hospitalist) and Candiss Norse (nephrologist) for a trial of hemodialysis.  Acute kidney injury superimposed on CKD stage III, complicated by systolic CHF, poor nutrition.  Nephrology has declined to offer hemodialysis.  [facility] and [facility] refused transfer earlier in hospitalization. --Urine output fair, potassium stable and BUN and creatinine without significant change.  Remains encephalopathic even uremic, now with metabolic acidosis. --Plan transfer to [place] for consideration of hemodialysis  Goals of care --Prognosis remains guarded but no apparent change over the last 96 hours.  Acute metabolic encephalopathy.   --Per the son the patient was high functioning prior to illness.  Is not clear to what to make of the neurology impression charted earlier given limited contact with the patient.  She has remained awake during examination these last several days and able to follow some commands although clearly encephalopathic.  Acute on chronic combined systolic, diastolic CHF, moderate MR, moderate RV systolic dysfunction, bilateral pleural effusions, anasarca, hypoalbuminemia.  Status post multiple acute hypoxic respiratory events secondary to volume overload.  Per chart has not responded well to medical management with diuretic therapy.  I discussed with previous attending physician Dr. Posey Pronto who advised against diuretic therapy based on his care of the patient.  Per chart, advanced heart failure team felt patient not a candidate for inotropic agents. --Per chart there  appears to be little to offer here other than hemodialysis.  Dysphagia secondary to achalasia, failed Botox, moderate  malnutrition --Tolerating trickle feeds well.  Would suggest increasing tube feed rates once volume can be managed with hemodialysis.  Diabetes mellitus type 2, A1c 5.9 --Blood sugars have been acceptable off long-acting insulin.  Continue sliding scale insulin.  Acute on chronic anemia, anemia of chronic disease, CKD --Hemoglobin has been stable.  Tardive dyskinesia diagnosed 12/19 during this hospitalization, presumed secondary to Reglan.  Cogentin stopped 1/10.  Status post treatment for ESBL Klebsiella aspiration pneumonia, enterococcus UTI  Status post NSTEMI, cardiology recommended medical management only. --Continue Lipitor, Plavix, Lopressor  Left temporal and frontal punctate CVA, seen by neurology --Continue Plavix and Lipitor  Lymph node within umbilical hernia, son declined core biopsy.  General surgery has evaluated, no further recommendations at this time.  Consultants   Neurology  Cardiology  General surgery  Critical care  Gastroenterology  Interventional radiology  Nephrology  [facility] events 11/10 PEG placed poor oral intake related 11/19 General surgery consulted, found to have nonincarcerated umbilical hernia no surgical needs and lymph node within hernia. 11/21 developed respiratory failure due to to significant volume overload from CHF exacerbation, treated aggressively with IV diuretics, NSTEMI medically managed, EF found to be 30% at that time 11/27 ENT consulted for stridor, flexible endoscopy negative 12/2 intubated for persistent pulmonary edema/hypervolemic status from heart failure flare (critical care was consulted for agonal respirations prompting transfer to ICU), with extensive IV diuresis, extubated 12/6 12/4 MRI showed new infarcts and patient was seen by neurology. 12/17 persistent AKI/worsening  of GFR 12/18 transfer to progressive unit 12/19 EEG consistent with generalized nonspecific cerebral dysfunction. 12/27 nephrology consulted for AKI, recommended deferring Lasix except for respiratory status. 1/7, nephrology deems not a dialysis candidate given age and multiple comorbidities/Poor functional status, progressive worsening kidney dysfunction, not improving with any medical management for CHF flare 1/12 advanced heart failure does not recommend any further medical management for CHF with reduced EF, and encourages goals of care discussion 1/13 patient's son requested transfer to tertiary care center for initiation of dialysis,  1/15 [facility] as well as [facility] both of which currently are at full capacity and unable to take the patient. Patient is not placed on any wait list on both facilities. Changing Jevity to Nepro and increasing free water. 1/17 nephrology reconsulted for final recommendation. Advanced heart failure team was also reconsulted. 02/20/2018. [facility] again at family's request, discussed with 2 different nephrologist there and felt the transfer is not medically necessary. Called [facility] at the request of family as well, discussed with hospitalist and medical administration at [facility] and they felt that the transfer is not medically necessary. Vomiting in a.m., tube feeds were held, resumed trickle. Worsening right pleural effusion. Offered daughter Psychologist, forensic. 1/24 Ethics meeting with nephrology and senior leadership.  Nephrology firm in recommendation not to pursue hemodialysis.  Attending physician felt there was a chance for improvement and pursued 1 additional facility per son request. 1/27 Accepted to [facility] for consideration of initiation of HD  Procedures   Echo 1/17  Study Result   Result status: Final result   *[facility]* [address], [place] [address] [phone]  ------------------------------------------------------------------- Transthoracic Echocardiography  Patient: Venicia, Vandall MR #: [id number] Study Date: 02/16/2018 Gender: F Age: 33 Height: 160 cm Weight: 78.1 kg BSA: 1.89 m^2 Pt. Status: Room: [place], [place], [place], [place], Inpatient SONOGRAPHER [facility]  Sh[place], [place] M  cc:  ------------------------------------------------------------------- LV EF: 30%  ------------------------------------------------------------------- Indications: CHF - 428.0.  ------------------------------------------------------------------- History: PMH: Coronary artery disease. Stroke. Risk factors: Hypertension. Diabetes mellitus.  ------------------------------------------------------------------- Study Conclusions  - Left ventricle: The cavity size was normal. Wall thickness was normal. The estimated ejection fraction was 30%. Diffuse hypokinesis. Features are consistent with a pseudonormal left ventricular filling pattern, with concomitant abnormal relaxation and increased filling pressure (grade 2 diastolic dysfunction). - Aortic valve: There was no stenosis. - Mitral valve: Mildly calcified annulus with restricted posterior leaflet. There was moderate regurgitation. - Left atrium: The atrium was mildly dilated. - Right ventricle: The cavity size was mildly dilated. Systolic function was moderately reduced. - Right atrium: The atrium was mildly dilated. - Systemic veins: IVC not visualized. - Pericardium, extracardiac: There was a pleural effusion.  Impressions:  - Technically difficult study with  poor acoustic windows. Normal LV size with diffuse hypokinesis, EF 30%. Moderate diastolic dysfunction. Mildly dilated RV with moderately decreased systolic function. Moderate mitral regurgitation.     Today's assessment: S: Had a good night per daughter.  Clinically the patient appears unchanged, remains encephalopathic and is not able to offer reliable history. O: Vitals:  Vitals:   02/26/18 0519 02/26/18 0701  BP: 132/83 (!) 143/71  Pulse: 75 74  Resp: 20 17  Temp: (!) 97.4 F (36.3 C) (!) 97.4 F (36.3 C)  SpO2: 100% 100%    Constitutional:  . Appears calm today and comfortable, not crying out  ENMT:  . grossly normal hearing  . Tongue appears unremarkable Respiratory:  . CTA bilaterally, no w/r/r.  . Respiratory effort normal.  Cardiovascular:  . RRR, no m/r/g . No change in 4+ bilateral LE extremity edema   Abdomen:  . Soft, nontender Musculoskeletal:  . No gross change, does not comply with examination Skin:  . No new lesions noted Neurologic:  . No gross motor changes noted. Psychiatric:  . Mental status o Confused, does follow some simple commands but is inconsistent, she is awake and appears nontoxic  Afebrile, vital signs stable CBG appears stable Sodium stable at 129, potassium 4.4, BUN 05/01/1943, creatinine 4.39, anion gap 16  Discharge Instructions   Allergies as of 02/26/2018      Reactions   Contrast Media [iodinated Diagnostic Agents]    Sensitivity, kidney issues      Medication List    STOP taking these medications   acetaminophen 500 MG tablet Commonly known as:  TYLENOL Replaced by:  acetaminophen 160 MG/5ML solution   amLODipine 10 MG tablet Commonly known as:  NORVASC   ferrous sulfate 220 (44 Fe) MG/5ML solution Replaced by:  ferrous sulfate 300 (60 Fe) MG/5ML syrup   glipiZIDE 5 MG tablet Commonly known as:  GLUCOTROL   isosorbide mononitrate 30 MG 24 hr tablet Commonly known as:  IMDUR   megestrol 20 MG  tablet Commonly known as:  MEGACE   megestrol 400 MG/10ML suspension Commonly known as:  MEGACE   mirtazapine 15 MG tablet Commonly known as:  REMERON   pantoprazole 40 MG tablet Commonly known as:  PROTONIX   polyethylene glycol packet Commonly known as:  MIRALAX / GLYCOLAX   protein supplement shake Liqd Commonly known as:  PREMIER PROTEIN   senna 8.6 MG Tabs tablet Commonly known as:  SENOKOT     TAKE these medications   acetaminophen 160 MG/5ML solution Commonly known as:  TYLENOL Place 15.6 mLs (500 mg total) into feeding tube every 6 (  six) hours as needed for mild pain or fever. Replaces:  acetaminophen 500 MG tablet   albuterol (2.5 MG/3ML) 0.083% nebulizer solution Commonly known as:  PROVENTIL Take 3 mLs (2.5 mg total) by nebulization every 4 (four) hours as needed for wheezing or shortness of breath.   atorvastatin 40 MG tablet Commonly known as:  LIPITOR Place 1 tablet (40 mg total) into feeding tube daily at 6 PM. What changed:    how to take this  when to take this   chlorhexidine 0.12 % solution Commonly known as:  PERIDEX 15 mLs by Mouth Rinse route 2 (two) times daily.   cholecalciferol 10 MCG (400 UNIT) Tabs tablet Commonly known as:  VITAMIN D3 Place 1 tablet (400 Units total) into feeding tube daily. Start taking on:  February 27, 2018 What changed:  how to take this   clopidogrel 75 MG tablet Commonly known as:  PLAVIX Place 1 tablet (75 mg total) into feeding tube daily. Start taking on:  February 27, 2018   collagenase ointment Commonly known as:  SANTYL Apply topically daily. Start taking on:  February 27, 2018   famotidine 10 MG tablet Commonly known as:  PEPCID Place 1 tablet (10 mg total) into feeding tube daily. Start taking on:  February 27, 2018   feeding supplement (NEPRO CARB STEADY) Liqd Place 1,000 mLs into feeding tube daily. What changed:    how much to take  how to take this  when to take this   ferrous  sulfate 300 (60 Fe) MG/5ML syrup Place 5 mLs (300 mg total) into feeding tube daily. Start taking on:  February 27, 2018 Replaces:  ferrous sulfate 220 (44 Fe) MG/5ML solution   free water Soln Place 30 mLs into feeding tube every 8 (eight) hours.   ipratropium 0.02 % nebulizer solution Commonly known as:  ATROVENT Take 2.5 mLs (0.5 mg total) by nebulization every 6 (six) hours as needed for wheezing or shortness of breath.   isosorbide-hydrALAZINE 20-37.5 MG tablet Commonly known as:  BIDIL Place 0.5 tablets into feeding tube 3 (three) times daily.   Melatonin 3 MG Tabs Take 1 tablet (3 mg total) by mouth at bedtime as needed (insomnia).   metoprolol tartrate 25 MG tablet Commonly known as:  LOPRESSOR Place 1 tablet (25 mg total) into feeding tube 2 (two) times daily. What changed:    medication strength  how much to take  how to take this   mouth rinse Liqd solution 15 mLs by Mouth Rinse route 2 times daily at 12 noon and 4 pm.   multivitamin Liqd Place 15 mLs into feeding tube daily. Start taking on:  February 27, 2018   nitroGLYCERIN 0.4 MG SL tablet Commonly known as:  NITROSTAT Place 1 tablet (0.4 mg total) under the tongue every 5 (five) minutes x 3 doses as needed for chest pain.   ondansetron 4 MG/2ML Soln injection Commonly known as:  ZOFRAN Inject 2 mLs (4 mg total) into the vein every 8 (eight) hours as needed for nausea or vomiting (nausea, vomiting).   patiromer 8.4 g packet Commonly known as:  VELTASSA Take 1 packet (8.4 g total) by mouth daily. Start taking on:  February 27, 2018   promethazine 25 MG/ML injection Commonly known as:  PHENERGAN Inject 0.25 mLs (6.25 mg total) into the vein every 6 (six) hours as needed for nausea or vomiting.            Durable Medical Equipment  (From admission, onward)  Start     Ordered   12/19/17 1128  For home use only DME standard manual wheelchair with seat cushion  Once    Comments:  Patient  suffers from bacteremia, failure to thrive, history of CVA which impairs their ability to perform daily activities like bathing, dressing, feeding, grooming and toileting in the home.  A cane, crutch or walker will not resolve  issue with performing activities of daily living. A wheelchair will allow patient to safely perform daily activities. Patient can safely propel the wheelchair in the home or has a caregiver who can provide assistance.  Accessories: elevating leg rests (ELRs), wheel locks, extensions and anti-tippers.   12/19/17 1128   12/19/17 0900  For home use only DME Hospital bed  Once    Question Answer Comment  Patient has (list medical condition): Stroke, PEG feedings   The above medical condition requires: Patient requires the ability to reposition frequently   Head must be elevated greater than: Other see comments   Bed type Semi-electric   Hoyer Lift Yes      12/19/17 0900         Allergies  Allergen Reactions  . Contrast Media [Iodinated Diagnostic Agents]     Sensitivity, kidney issues    The results of significant diagnostics from this hospitalization (including imaging, microbiology, ancillary and laboratory) are listed below for reference.    Significant Diagnostic Studies: Ct Abdomen Pelvis Wo Contrast  Result Date: 02/09/2018 CLINICAL DATA:  Lymph node within umbilical hernia. EXAM: CT ABDOMEN AND PELVIS WITHOUT CONTRAST TECHNIQUE: Multidetector CT imaging of the abdomen and pelvis was performed following the standard protocol without IV contrast. Exam is somewhat limited due to persistent patient motion. COMPARISON:  CT scan of November 04, 2017. FINDINGS: Lower chest: Moderate bilateral pleural effusions are noted with adjacent subsegmental atelectasis. Hepatobiliary: No focal liver abnormality is seen. No gallstones, gallbladder wall thickening, or biliary dilatation. Pancreas: Unremarkable. No pancreatic ductal dilatation or surrounding inflammatory changes.  Spleen: Normal in size without focal abnormality. Adrenals/Urinary Tract: Adrenal glands and kidneys are unremarkable. No hydronephrosis or renal obstruction is noted. No renal or ureteral calculi are noted. Vascular calcifications are noted in both kidneys. Urinary bladder is decompressed secondary to Foley catheter. Stomach/Bowel: Gastrostomy tube is well position within distal stomach. There is no evidence of bowel obstruction. Stool is noted throughout the colon. The appendix is not clearly visualized. Vascular/Lymphatic: Atherosclerosis of abdominal aorta is noted without aneurysm formation. The lymph node noted within the umbilical hernia on prior exam has significantly increased in size, now measuring 2.6 x 2.5 cm. This is concerning for malignancy. Reproductive: Enlarged fibroid uterus is noted. No definite adnexal abnormality is noted. Other: Moderate anasarca is noted. Musculoskeletal: Multilevel degenerative disc disease is noted in the lumbar spine. No acute abnormality is noted. IMPRESSION: Lymph node noted within umbilical hernia on prior exam has significantly increased in size, now measuring 2.6 cm in maximum diameter. This is highly concerning for malignancy and tissue sampling is recommended. These results will be called to the ordering clinician or representative by the Radiologist Assistant, and communication documented in the PACS or zVision Dashboard. Moderate bilateral pleural effusions are noted with adjacent subsegmental atelectasis. Moderate anasarca. Enlarged fibroid uterus. Gastrostomy tube is noted. Aortic Atherosclerosis (ICD10-I70.0). Electronically Signed   By: Marijo Conception, M.D.   On: 02/09/2018 13:58   Dg Abd 1 View  Result Date: 02/06/2018 CLINICAL DATA:  Constipation, history coronary artery disease, stroke, cardiomyopathy, stage III chronic kidney  disease, hypertension, type II diabetes mellitus EXAM: ABDOMEN - 1 VIEW COMPARISON:  01/23/2018 FINDINGS: Gastrostomy tube  projects over stomach. Normal bowel gas pattern. No bowel dilatation or bowel wall thickening. Scattered atherosclerotic calcifications. Dextroconvex thoracolumbar scoliosis with degenerative disc and facet disease changes of the lumbar spine. Bones appear demineralized. Degenerative changes of the RIGHT hip joint also noted. Rounded calcifications in the central pelvis question related to degenerated leiomyoma. IMPRESSION: Normal bowel gas pattern. Electronically Signed   By: Lavonia Dana M.D.   On: 02/06/2018 17:31   Dg Chest Port 1 View  Result Date: 02/20/2018 CLINICAL DATA:  85 year old female with vomiting. Query aspiration. Renal failure, status post NSTEMI last month, heart failure. Recent infections. Shortness of breath. EXAM: PORTABLE CHEST 1 VIEW COMPARISON:  0707 hours earlier today. FINDINGS: Portable AP semi upright view at 1007 hours. Stable right PICC line. Stable cardiomegaly and mediastinal contours. Stable veiling and confluent bibasilar opacity from earlier today, with some pleural fluid tracking in the right minor fissure. No new areas of pulmonary opacity. No pneumothorax. Stable upper lobe vascularity. A proximal right humerus deformity is chronic and stable. Paucity of bowel gas in the upper abdomen. IMPRESSION: Stable from earlier today. Bilateral pleural effusions and lower lobe collapse or consolidation with no new cardiopulmonary abnormality. Electronically Signed   By: Genevie Ann M.D.   On: 02/20/2018 10:46   Dg Chest Port 1 View  Result Date: 02/20/2018 CLINICAL DATA:  85 year old female with renal failure, status post NSTEMI last month, heart failure. Recent infections. Shortness of breath today. EXAM: PORTABLE CHEST 1 VIEW COMPARISON:  CT Abdomen and Pelvis 02/09/2018. Portable chest 02/07/2018 and earlier. FINDINGS: Portable AP semi upright view at 0707 hours. Stable right PICC line. Stable cardiomegaly and mediastinal contours. Prior CABG. Bibasilar veiling and confluent  pulmonary opacity persists, mildly increased on the right since 02/08/2008 and fluid now tracking in the right minor fissure. Apical pulmonary vascularity appears fairly normal. No pneumothorax. Visualized tracheal air column is within normal limits. No acute osseous abnormality identified. IMPRESSION: Cardiomegaly with bilateral pleural effusions with bibasilar atelectasis or consolidation. Right pleural effusion appears mildly increased since 02/07/2018, now moderate overall. Electronically Signed   By: Genevie Ann M.D.   On: 02/20/2018 07:26   Dg Chest Port 1 View  Result Date: 02/07/2018 CLINICAL DATA:  Dyspnea EXAM: PORTABLE CHEST 1 VIEW COMPARISON:  02/01/2018, 01/13/2018 FINDINGS: Cardiomegaly with vascular congestion and moderate pulmonary edema. Likely layering bilateral pleural effusions. Slight worsening of dense bibasilar airspace disease. Right upper extremity catheter tip overlies the SVC origin. Aortic atherosclerosis. No pneumothorax. IMPRESSION: 1. Cardiomegaly with vascular congestion and continued moderate pulmonary edema. 2. Layering pleural effusions, right greater than left, likely attributing to overall hazy appearance of the lung fields. Worsening consolidation at both bases. Electronically Signed   By: Donavan Foil M.D.   On: 02/07/2018 17:05   Dg Chest Port 1 View  Result Date: 02/01/2018 CLINICAL DATA:  Acute onset shortness of breath and respiratory failure. Current history of CHF. EXAM: PORTABLE CHEST 1 VIEW COMPARISON:  01/13/2018 and earlier. FINDINGS: Prior sternotomy for CABG. Cardiac silhouette moderately enlarged, unchanged. Diffuse interstitial and airspace pulmonary edema, asymmetric and increased in the RIGHT lung, worse than on the examination 3 weeks ago. Large RIGHT pleural effusion, increased in size in the interval. Stable moderate-sized LEFT pleural effusion. Stable consolidation in the lower lobes. RIGHT arm PICC tip projects over the mid SVC. IMPRESSION: Diffuse  interstitial and airspace pulmonary edema indicating CHF, asymmetric and  increased in the RIGHT lung, worse than on the examination 3 weeks ago. Large RIGHT pleural effusion, increased in size in the interval. Stable moderate-sized LEFT pleural effusion. Electronically Signed   By: Evangeline Dakin M.D.   On: 02/01/2018 12:25   Dg Abd Portable 1v  Result Date: 02/20/2018 CLINICAL DATA:  85 year old female with vomiting. Query aspiration. Renal failure, status post NSTEMI last month, heart failure. Recent infections. Shortness of breath. EXAM: PORTABLE ABDOMEN - 1 VIEW COMPARISON:  CT Abdomen and Pelvis 02/09/2018 and earlier. FINDINGS: Portable AP view at 1007 hours. Gastrostomy tube redemonstrated. The mildly increased gas in the stomach compared to the prior CT, but normal and nonobstructed bowel-gas pattern elsewhere. Aortic and iliofemoral calcified atherosclerosis. Mild-to-moderate degenerative appearing dextroconvex lumbar spine curvature. Multilevel disc and endplate degeneration. No acute osseous abnormality identified. IMPRESSION: Stable gastrostomy tube and bowel-gas pattern within normal limits. Electronically Signed   By: Genevie Ann M.D.   On: 02/20/2018 10:48   Vas [place] Lower Extremity Venous (dvt)  Result Date: 02/18/2018  Lower Venous Study Indications: Edema.  Limitations: Edema. Comparison Study: Prior negative studies from 01/05/18 and 09/22/16 are available                   for comparison Performing Technologist: Sharion Dove RVS  Examination Guidelines: A complete evaluation includes B-mode imaging, spectral Doppler, color Doppler, and power Doppler as needed of all accessible portions of each vessel. Bilateral testing is considered an integral part of a complete examination. Limited examinations for reoccurring indications may be performed as noted.  Right Venous Findings: +---+---------------+---------+-----------+----------+-------+     CompressibilityPhasicitySpontaneityPropertiesSummary +---+---------------+---------+-----------+----------+-------+ CFVFull           Yes      Yes                          +---+---------------+---------+-----------+----------+-------+  Left Venous Findings: +---------+---------------+---------+-----------+----------+------------------+          CompressibilityPhasicitySpontaneityPropertiesSummary            +---------+---------------+---------+-----------+----------+------------------+ CFV                     Yes      Yes                  visualized,                                                              pulsatile          +---------+---------------+---------+-----------+----------+------------------+ FV Prox                 Yes      Yes                  visualized,                                                              pulsatile          +---------+---------------+---------+-----------+----------+------------------+ FV Mid  Yes      Yes                                     +---------+---------------+---------+-----------+----------+------------------+ FV Distal               Yes      Yes                                     +---------+---------------+---------+-----------+----------+------------------+ POP                     Yes      Yes                  visualized,                                                              pulsatile          +---------+---------------+---------+-----------+----------+------------------+ PTV                                                   visualized         +---------+---------------+---------+-----------+----------+------------------+ PERO                                                  visualized         +---------+---------------+---------+-----------+----------+------------------+    Summary: Right: No evidence of common femoral vein obstruction. Left: There  is no evidence of deep vein thrombosis in the lower extremity. However, portions of this examination were limited- see technologist comments above. Significant interstitial fluid noted throughout.  *See table(s) above for measurements and observations. Electronically signed by Harold Barban MD on 02/18/2018 at 5:01:59 PM.    Final    Labs: Basic Metabolic Panel: Recent Labs  Lab 02/22/18 1034 02/23/18 0410 02/24/18 0606 02/25/18 0500 02/26/18 0500  NA 127* 126* 128* 129* 127*  K 4.9 4.5 4.2 4.3 4.4  CL 93* 92* 93* 93* 91*  CO2 20* 21* 22 21* 20*  GLUCOSE 95 121* 130* 144* 174*  BUN 155* 153* 149* 150* 147*  CREATININE 4.26* 4.33* 4.35* 4.48* 4.39*  CALCIUM 8.9 8.9 8.7* 8.5* 8.6*  PHOS 5.5* 5.2* 5.2* 5.1* 4.8*   Liver Function Tests: Recent Labs  Lab 02/22/18 1034 02/23/18 0410 02/24/18 0606 02/25/18 0500 02/26/18 0500  ALBUMIN 1.8* 1.9* 1.8* 1.8* 1.8*   CBC: Recent Labs  Lab 02/20/18 0420 02/21/18 0433  WBC 8.1 5.5  HGB 7.3* 8.0*  HCT 23.6* 26.0*  MCV 95.5 95.2  PLT 249 207    Recent Labs    01/01/18 0434 01/07/18 1810 02/01/18 1150  BNP >4,500.0* >4,500.0* >4,500.0*   CBG: Recent Labs  Lab 02/25/18 1138 02/25/18 1641 02/25/18 1936 02/25/18 2342 02/26/18 0617  GLUCAP 161* 161* 169* 177* 157*    Principal Problem:   AKI (  acute kidney injury) ([place]) Active Problems:   Diabetes mellitus ([place])   Hyperlipidemia   Essential hypertension   Coronary atherosclerosis   Pleural effusion   Stroke (cerebrum) (HCC)   Stage 3 chronic kidney disease (HCC)   Enlarged lymph node   Dysarthria   Adult failure to thrive   UTI (urinary tract infection)   Palliative care by specialist   NSTEMI (non-ST elevated myocardial infarction) ([place])   Acute on chronic respiratory failure with hypoxia (HCC)   CKD (chronic kidney disease) stage 5, GFR less than 15 ml/min (HCC)   Acute metabolic encephalopathy   Acute on chronic combined systolic and diastolic CHF (congestive  heart failure) ([place])   Anasarca   Time coordinating discharge: 50 minutes  Signed:  [name], MD  [facility]  02/26/2018, 11:52 AM

## 2018-02-26 NOTE — Progress Notes (Signed)
Received call from Son asking for the RD number directly. Stated to Son that I did not have her direct number, but a message has been sent in regards to the ordered type of tube feeding. Stated we could go ahead and give the patient the ordered tube feed since it was going to be administered at the slow rate to reduce vomiting. Son refused that option and wanted to wait till the morning to speak with the doctor and dietician.   Fuller Mandril, RN

## 2018-02-26 NOTE — Progress Notes (Signed)
Family Meeting Note  Advance Directive:no Today a meeting took place with the dter patient transferred from [facility] for trial of hemodialysis. She has multiple comorbidities and was admitted for more than two months at [facility]. Daughter in the room. Patient is a full code. Daughter agrees with it.  Time spent during discussion: 67mins Fritzi Mandes, MD

## 2018-02-26 NOTE — Progress Notes (Addendum)
Initial Nutrition Assessment  DOCUMENTATION CODES:   Not applicable  INTERVENTION:   Recommend Osmolite 1.5 @ goal rate of 57ml/hr- Initiate at 69ml/hr and increase by 38ml/hr q 8 hours until goal rate is reached    Prostat 95ml BID via tube   Free water flushes 69ml q 4hrs  Regimen provides 1640kcal/day, 90g/day protein, 886ml free water  Liquid MVI daily via tube  B-complex with C daily via tube  NUTRITION DIAGNOSIS:   Inadequate oral intake related to dysphagia as evidenced by NPO status.  GOAL:   Patient will meet greater than or equal to 90% of their needs  MONITOR:   Labs, Weight trends, TF tolerance, Skin, I & O's  REASON FOR ASSESSMENT:   Consult Enteral/tube feeding initiation and management  ASSESSMENT:   85 y.o. female with a known history of acute metabolic encephalopathy, combined systolic diastolic congestive heart failure moderate MR, acute CVA, ESBL Klebsiella aspiration pneumonia and enterococcus UTI, acute non-STEMI was admitted at [facility] in early part of November with weight loss and dysphagia due to achalasia. Pt transferred to [facility] for HD initiation    11/7 - per IR notes, imaging revealed moderate sized hiatal hernia and colon in upper abdomen,recommending evaluating anatomy with fluoroscopy 11/8 - PICC placed 11/10 - TPN started, IR placed G-tube 11/11 -G-tube cleared for use by RI, TF initiated 11/13 - TPN d/c 11/20 - transitioned to bolus feedings 11/23 - s/p BSE, recommend continued NPO due to AMS, emesis with bolus feedings, rapid response called 11/25 - rapid response, tube feeds held and restarted in the afternoon at 20 ml/hr 11/27 - per MD, tube feeds at 35 ml/hr during the daytime and 30 ml/hr overnight 12/01 - rapid response for increased WOB 12/02 - emesis, TF turned off, intubated 12/06 - extubated, BiPAP at night 12/30 Vital AF 1.2 discontinued, Jevity 1.2 started >> goal rate 55 ml/hr 12/31 Jevity 1.2 decreased to 40  ml/hr per pt's son request  1/3- Jevity 1.2 @40ml /hr 1/9- Jevity 1.2 @55ml /hr 1/15- Nepro @40ml /hr per son's request  1/22-Nepro @10ml /hr 1/24-Nepro @20ml /hr  Visited pt's room today. Pt unable to provide any nutrition related history.   Per chart review, pt s/p IR placed G-tube 11/10. Pt not tolerating tube feeds d/t vomiting. Tube feeds being given at a trickle rate. Will change pt to Osmolite 1.5 as this is lower in fiber and may be better tolerated. Will attempt to advance to goal rate over 24 hrs. Per chart, pt appears to have lost ~28lbs(20%) from Feb. 2019 to November 2019. Pt 65.8kg in Feb 2019 and 52.9kg in November. It is difficult to determine how much true weight loss as pt with severe edema. Pt currently up 50lbs from her UBW. Pt to have HD cath placed today and initiate dialysis. Pt with previous malnutrition diagnosis; unable to diagnose at this time as pt with severe edema.    Medications reviewed and include: Plavix, ferrous sulfate, melatonin, MVI, veltassa, phenergan   Labs reviewed: Na 127(L), K 4.4 wnl, Cl 91(L), BUN 147(H), creat 4.39(H), P 4.8 wnl, alb 1.8(L) Hgb 8.0(L), Hct 26.0(L) cbgs- 157, 193, 200 x 24 hrs AIC 5.9(H)- 11/24 iPTH 27 10/24 Iron 29, TIBC 99(L), ferritin 1311, folate 17.2  NUTRITION - FOCUSED PHYSICAL EXAM:    Most Recent Value  Orbital Region  No depletion  Upper Arm Region  Unable to assess  Thoracic and Lumbar Region  No depletion  Buccal Region  Mild depletion  Temple Region  Moderate depletion  Clavicle Bone Region  Mild depletion  Clavicle and Acromion Bone Region  Mild depletion  Scapular Bone Region  Mild depletion  Dorsal Hand  Mild depletion  Patellar Region  Unable to assess  Anterior Thigh Region  Unable to assess  Posterior Calf Region  Unable to assess  Edema (RD Assessment)  Severe  Hair  Reviewed  Eyes  Reviewed  Mouth  Reviewed  Skin  Reviewed  Nails  Reviewed     Diet Order:   Diet Order    None     EDUCATION  NEEDS:   Not appropriate for education at this time  Skin:  Skin Assessment: Reviewed RN Assessment(unstageable wound buttocks, MASD )  Last BM:  1/26- type 6  Height:   Ht Readings from Last 1 Encounters:  12/20/17 5\' 3"  (1.6 m)    Weight:   Wt Readings from Last 1 Encounters:  02/24/18 88.5 kg    Ideal Body Weight:  52.3 kg  BMI:  There is no height or weight on file to calculate BMI.  Estimated Nutritional Needs:   Kcal:  1400-1600kcal/day   Protein:  78-89g/day   Fluid:  UOP +1L  Koleen Distance MS, RD, LDN Pager #- [phone] Office#- [phone] After Hours Pager: [phone]

## 2018-02-26 NOTE — H&P (Addendum)
[place] [facility] [place] NAME: [name]    MR#:  [id number]  DATE OF BIRTH:  Apr 21, 1927  DATE OF ADMISSION:  02/26/2018  PRIMARY CARE PHYSICIAN: Deland Pretty, MD   REQUESTING/REFERRING PHYSICIAN: Dr Sarajane Jews [facility])  CHIEF COMPLAINT:  [name] is transferred from [facility] upon family son and daughters request for trial of hemodialysis.  HISTORY OF PRESENT ILLNESS:  [name]  is a 85 y.o. female with a known history of acute metabolic encephalopathy, combined systolic diastolic congestive heart failure moderate MR, acute CVA, ESBL Klebsiella aspiration pneumonia and enterococcus UTI, acute non-STEMI was admitted at [facility] in early part of November with weight loss and dysphagia due to achalasia. [name] had a very complicated course at [facility] over the last couple months. Kindly review [facility] medical records for details.  [name] apparently has been transferred to [facility] for trial of hemodialysis. She was declined for transfer at other tertiary care centers and family request [name] given a trial of hemodialysis.  Nephrology Dr. Candiss Norse aware about [name] and was agreeable with [name] being transferred to [facility] regional.  [name] currently appears chronically ill, debilitated, and [name]. She opens eyes to verbal command. She is unable to hold any meaningful conversation. Daughter at bedside.  PAST MEDICAL HISTORY:   Past Medical History:  Diagnosis Date  . Acute renal injury ([place]) 07/2016  . Anginal pain ([place]) 2004; 2011  . CAD (coronary artery disease)    status post stenting of the RCA status post CABG. (left internal mammary artery to  LAD, saphenous vein graft to second diagonal, saphenous vein  graft to obtuse marginal 1, saphenous vein graft to posterior  descending).  . Cardiomyopathy     Mildly reduced EF (45%).   . Chronic kidney disease (CKD), stage III (moderate) ([place])   .  History of blood transfusion 1960s   "related to anemia"  . HTN (hypertension)   . Pleural effusion 2011   S/P "bypass"  . Shortness of breath   . Stroke ([place]) 09/19/2016   "left sided weakness" (09/20/2016)  . Type II diabetes mellitus ([place])     PAST SURGICAL HISTOIRY:   Past Surgical History:  Procedure Laterality Date  . BOTOX INJECTION  11/20/2017   Procedure: BOTOX INJECTION;  Surgeon: Doran Stabler, MD;  Location: [facility];  Service: Gastroenterology;;  . CATARACT EXTRACTION W/ INTRAOCULAR LENS  IMPLANT, BILATERAL Bilateral   . CORONARY ANGIOPLASTY WITH STENT PLACEMENT  2004   "LAD & one of the circumflex"  . CORONARY ARTERY BYPASS GRAFT  2011   CABG X4  . ESOPHAGOGASTRODUODENOSCOPY (EGD) WITH PROPOFOL N/A 11/20/2017   Procedure: ESOPHAGOGASTRODUODENOSCOPY (EGD) WITH PROPOFOL;  Surgeon: Doran Stabler, MD;  Location: [facility];  Service: Gastroenterology;  Laterality: N/A;  . IR GASTROSTOMY TUBE MOD SED  12/10/2017    SOCIAL HISTORY:   Social History   Tobacco Use  . Smoking status: Never Smoker  . Smokeless tobacco: Never Used  Substance Use Topics  . Alcohol use: No    FAMILY HISTORY:   Family History  Problem Relation Age of Onset  . Diabetes Father 54  . Unexplained death Mother 71  . CAD Neg Hx     DRUG ALLERGIES:   Allergies  Allergen Reactions  . Contrast Media [Iodinated Diagnostic Agents]     Sensitivity, kidney issues    REVIEW OF SYSTEMS:  Review of Systems  Unable to perform ROS:  Mental status change     MEDICATIONS AT HOME:   Prior to Admission medications   Medication Sig Start Date End Date Taking? Authorizing Provider  acetaminophen (TYLENOL) 160 MG/5ML solution Place 15.6 mLs (500 mg total) into feeding tube every 6 (six) hours as needed for mild pain or fever. 02/26/18   Samuella Cota, MD  albuterol (PROVENTIL) (2.5 MG/3ML) 0.083% nebulizer solution Take 3 mLs (2.5 mg total) by nebulization every 4 (four)  hours as needed for wheezing or shortness of breath. 02/26/18   Samuella Cota, MD  atorvastatin (LIPITOR) 40 MG tablet Place 1 tablet (40 mg total) into feeding tube daily at 6 PM. 02/26/18   Samuella Cota, MD  chlorhexidine (PERIDEX) 0.12 % solution 15 mLs by Mouth Rinse route 2 (two) times daily. 02/26/18   Samuella Cota, MD  cholecalciferol (VITAMIN D3) 10 MCG (400 UNIT) TABS tablet Place 1 tablet (400 Units total) into feeding tube daily. 02/27/18   Samuella Cota, MD  clopidogrel (PLAVIX) 75 MG tablet Place 1 tablet (75 mg total) into feeding tube daily. 02/27/18   Samuella Cota, MD  collagenase (SANTYL) ointment Apply topically daily. 02/27/18   Samuella Cota, MD  famotidine (PEPCID) 10 MG tablet Place 1 tablet (10 mg total) into feeding tube daily. 02/27/18   Samuella Cota, MD  ferrous sulfate 300 (60 Fe) MG/5ML syrup Place 5 mLs (300 mg total) into feeding tube daily. 02/27/18   Samuella Cota, MD  ipratropium (ATROVENT) 0.02 % nebulizer solution Take 2.5 mLs (0.5 mg total) by nebulization every 6 (six) hours as needed for wheezing or shortness of breath. 02/26/18   Samuella Cota, MD  isosorbide-hydrALAZINE (BIDIL) 20-37.5 MG tablet Place 0.5 tablets into feeding tube 3 (three) times daily. 02/26/18   Samuella Cota, MD  Melatonin 3 MG TABS Take 1 tablet (3 mg total) by mouth at bedtime as needed (insomnia). 02/26/18   Samuella Cota, MD  metoprolol tartrate (LOPRESSOR) 25 MG tablet Place 1 tablet (25 mg total) into feeding tube 2 (two) times daily. 02/26/18   Samuella Cota, MD  mouth rinse LIQD solution 15 mLs by Mouth Rinse route 2 times daily at 12 noon and 4 pm. 02/26/18   Samuella Cota, MD  Multiple Vitamin (MULTIVITAMIN) LIQD Place 15 mLs into feeding tube daily. 02/27/18   Samuella Cota, MD  nitroGLYCERIN (NITROSTAT) 0.4 MG SL tablet Place 1 tablet (0.4 mg total) under the tongue every 5 (five) minutes x 3 doses as needed for chest  pain. 02/26/18   Samuella Cota, MD  Nutritional Supplements (FEEDING SUPPLEMENT, NEPRO CARB STEADY,) LIQD Place 1,000 mLs into feeding tube daily. 02/26/18   Samuella Cota, MD  ondansetron [facility]) 4 MG/2ML SOLN injection Inject 2 mLs (4 mg total) into the vein every 8 (eight) hours as needed for nausea or vomiting (nausea, vomiting). 02/26/18   Samuella Cota, MD  patiromer (VELTASSA) 8.4 g packet Take 1 packet (8.4 g total) by mouth daily. 02/27/18   Samuella Cota, MD  promethazine (PHENERGAN) 25 MG/ML injection Inject 0.25 mLs (6.25 mg total) into the vein every 6 (six) hours as needed for nausea or vomiting. 02/26/18   Samuella Cota, MD  Water For Irrigation, Sterile (FREE WATER) SOLN Place 30 mLs into feeding tube every 8 (eight) hours. 02/26/18   Samuella Cota, MD      VITAL SIGNS:  Temperature (!) 96.2 F (35.7 C), temperature  source Rectal.  PHYSICAL EXAMINATION:  GENERAL:  85 y.o.-year-old [name] lying in the bed with no acute distress. Appears chronically ill EYES: Pupils equal, round, reactive to light and accommodation. No scleral icterus. Extraocular muscles intact. Anemic HEENT: Head atraumatic, normocephalic. Oropharynx and nasopharynx clear.  NECK:  Supple, no jugular venous distention. No thyroid enlargement, no tenderness.  LUNGS decreased breath sounds bilaterally, no wheezing, rales,rhonchi or crepitation. No use of accessory muscles of respiration.  CARDIOVASCULAR: S1, S2 normal. No murmurs, rubs, or gallops.  ABDOMEN: Soft, nontender, nondistended. Bowel sounds present. No organomegaly or mass. PEGTube site looks okay EXTREMITIES: significant edema both lower extremity, Anasarca NEUROLOGIC: unable to assess secondary to patients mental status  PSYCHIATRIC: The [name] is alert able to hold any meaningful conversation , confused SKIN: No obvious rash, lesion, or ulcer.   LABORATORY PANEL:   CBC Recent Labs  Lab 02/21/18 0433  WBC 5.5  HGB  8.0*  HCT 26.0*  PLT 207   ------------------------------------------------------------------------------------------------------------------  Chemistries  Recent Labs  Lab 02/26/18 0500  NA 127*  K 4.4  CL 91*  CO2 20*  GLUCOSE 174*  BUN 147*  CREATININE 4.39*  CALCIUM 8.6*   ------------------------------------------------------------------------------------------------------------------  Cardiac Enzymes No results for input(s): TROPONINI in the last 168 hours. ------------------------------------------------------------------------------------------------------------------  RADIOLOGY:  No results found.  EKG:    IMPRESSION AND PLAN:   [name]  is a 85 y.o. female with a known history of acute metabolic encephalopathy, combined systolic diastolic congestive heart failure moderate MR, acute CVA, ESBL Klebsiella aspiration pneumonia and enterococcus UTI, acute non-STEMI was admitted at [facility] in early part of November with weight loss and dysphagia due to achalasia.   1. Acute kidney injury superimposed on CKD stage III complicated by systolic/diastolic CHF, poor nutrition, cardio renal syndrome -[name] has metabolic encephalopathy/uremia -[name] was transferred from [facility] since nephrology at [facility] declined to offer hemodialysis. [facility] transfer earlier in the hospitalization there as well. -Son and daughter were persistent and would like to give trial of dialysis to [name]. They do understand the risk and complications involved in the process of dialysis. -Nephrology consultation with Dr. Candiss Norse. -Vascular consultation for HD Access  2. Acute metabolic encephalopathy due to number one multiple other comorbidities  3. Acute on chronic mild systolic, diastolic congestive heart failure with moderate MR and bilateral pleural effusion with and [name] and hypo albumin anemia -cont cardiac cardiac meds  4. Dysphagia secondary to achalasia.  [name] failed Botox and status post peg tube feeding -[name] has been having symptoms of vomiting. She has been getting tube feeding at 20 mL an hour. Will resume tube feeding dietitian consultation placed.  5. Type II diabetes -sliding scale insulin  6. History of left temporal and frontal punctate CVA -continue Plavix and Lipitor  7. Status post non-ST EMI -continue Lipitor Plavix and Lopressor  8.Lymph node within umbilical hernia, son declined core biopsy according to [facility] clinical notes General surgery has evaluated at [facility], no further recommendations at this time.---will need to readdress with son to r/o malignancy  Above was discussed with [name]'s daughter who was present in the room  pt is a full code  All the records are reviewed and case discussed with ED provider.   CODE STATUS: FULL  TOTAL TIME TAKING CARE OF THIS [name]: *40* minutes.    Fritzi Mandes M.D on 02/26/2018 at 3:54 PM  Between 7am to 6pm - Pager - [phone]  After 6pm go to [email or web address] -  password [email or web address]  [facility]  Office  [phone]  CC: Primary care physician; Deland Pretty, MD

## 2018-02-26 NOTE — Final Progress Note (Signed)
Report given to RN [address]. Report also given to carelink at bedside. Patient appears comfortable at the current time. Daughter at bedside. Will transfer for HD to [place].

## 2018-02-27 ENCOUNTER — Inpatient Hospital Stay: Payer: Medicare Other

## 2018-02-27 LAB — BASIC METABOLIC PANEL
Anion gap: 11 (ref 5–15)
BUN: 152 mg/dL — ABNORMAL HIGH (ref 8–23)
CO2: 21 mmol/L — ABNORMAL LOW (ref 22–32)
Calcium: 8.3 mg/dL — ABNORMAL LOW (ref 8.9–10.3)
Chloride: 93 mmol/L — ABNORMAL LOW (ref 98–111)
Creatinine, Ser: 4.47 mg/dL — ABNORMAL HIGH (ref 0.44–1.00)
GFR calc Af Amer: 10 mL/min — ABNORMAL LOW (ref >60)
GFR calc non Af Amer: 8 mL/min — ABNORMAL LOW (ref >60)
GLUCOSE: 126 mg/dL — AB (ref 70–99)
Potassium: 4.6 mmol/L (ref 3.5–5.1)
Sodium: 125 mmol/L — ABNORMAL LOW (ref 135–145)

## 2018-02-27 LAB — HEPATITIS B SURFACE ANTIGEN: Hepatitis B Surface Ag: NEGATIVE

## 2018-02-27 LAB — GLUCOSE, CAPILLARY: GLUCOSE-CAPILLARY: 111 mg/dL — AB (ref 70–99)

## 2018-02-27 LAB — HEPATITIS B CORE ANTIBODY, IGM: Hep B C IgM: NEGATIVE

## 2018-02-27 LAB — HEPATITIS B SURFACE ANTIBODY,QUALITATIVE: Hep B S Ab: NONREACTIVE

## 2018-02-27 MED ORDER — INSULIN ASPART 100 UNIT/ML ~~LOC~~ SOLN
0.0000 [IU] | Freq: Four times a day (QID) | SUBCUTANEOUS | Status: DC
  Administered 2018-02-28 (×4): 2 [IU] via SUBCUTANEOUS
  Administered 2018-03-01 (×3): 3 [IU] via SUBCUTANEOUS
  Administered 2018-03-02 (×2): 5 [IU] via SUBCUTANEOUS
  Administered 2018-03-02 (×2): 3 [IU] via SUBCUTANEOUS
  Administered 2018-03-03: 2 [IU] via SUBCUTANEOUS
  Administered 2018-03-04 (×2): 3 [IU] via SUBCUTANEOUS
  Administered 2018-03-04 (×2): 2 [IU] via SUBCUTANEOUS
  Administered 2018-03-05 – 2018-03-06 (×3): 3 [IU] via SUBCUTANEOUS
  Filled 2018-02-27 (×19): qty 1

## 2018-02-27 MED ORDER — PRO-STAT SUGAR FREE PO LIQD
30.0000 mL | Freq: Two times a day (BID) | ORAL | Status: DC
  Administered 2018-02-28 – 2018-03-02 (×4): 30 mL

## 2018-02-27 MED ORDER — PANTOPRAZOLE SODIUM 40 MG PO PACK
40.0000 mg | PACK | Freq: Every day | ORAL | Status: DC
  Administered 2018-02-28 – 2018-03-06 (×6): 40 mg
  Filled 2018-02-27 (×8): qty 20

## 2018-02-27 MED ORDER — ADULT MULTIVITAMIN LIQUID CH
15.0000 mL | Freq: Every day | ORAL | Status: DC
  Administered 2018-02-28 – 2018-03-01 (×2): 15 mL
  Filled 2018-02-27 (×2): qty 15

## 2018-02-27 MED ORDER — CHLORHEXIDINE GLUCONATE CLOTH 2 % EX PADS: 6.0000 | MEDICATED_PAD | Freq: Every day | CUTANEOUS | Status: DC

## 2018-02-27 NOTE — Progress Notes (Signed)
[facility] [place] NAME: [name]    MR#:  [id number]  DATE OF BIRTH:  Oct 31, 1927  SUBJECTIVE:  Patient here to receive dialysis, Nephrology following. Remains somnolent, critically ill, and unable to provide information. Her son Dr. Mayer Masker is present and at bedside.  REVIEW OF SYSTEMS:  ROS  Unable to assess due to somnolence.  DRUG ALLERGIES:   Allergies  Allergen Reactions  . Contrast Media [Iodinated Diagnostic Agents]     Sensitivity, kidney issues   VITALS:  Blood pressure (!) 119/51, pulse 71, temperature (!) 97.5 F (36.4 C), temperature source Oral, resp. rate 16. PHYSICAL EXAMINATION:   Did not perform at family request as patient had just been evaluated by Nephrology, their exam as follows (Dr. Candiss Norse)  Physical Exam: General:  Chronically ill-appearing, frail, elderly, in bed  HEENT  dry oral mucous membranes  Neck  distended neck veins  Pulm/lungs  Limited exam, diffuse bilateral crackles  CVS/Heart  no rub, irregular  Abdomen:   Soft, nontender, nondistended  Extremities:  2-3+ pitting edema up to lower abdomen  Neurologic:  Hard of hearing, not able to answer any questions  Skin:  No acute rashes  Access:  Right femoral dialysis catheter    LABORATORY PANEL:  Female CBC Recent Labs  Lab 02/26/18 2040  WBC 5.9  HGB 8.5*  HCT 27.5*  PLT 246   ------------------------------------------------------------------------------------------------------------------ Chemistries  Recent Labs  Lab 02/27/18 0902  NA 125*  K 4.6  CL 93*  CO2 21*  GLUCOSE 126*  BUN 152*  CREATININE 4.47*  CALCIUM 8.3*   RADIOLOGY:  No results found. ASSESSMENT AND PLAN:   [name] is an 85 y.o. African-American female with multiple comorbidities including diabetes type 2, hypertension, hyperlipidemia, coronary disease status post CABG, history of stroke, CKD, was admitted on1/27/2020. [facility] dys[facility] and achalasiaand 50 pound weight loss, 12/2017.  1. Acute kidney injury on chronic kidney disease stage III. Uremia, likely causing altered mental status/acute metabolic encephalopathy -complicated by CHF, poor nutrition, cardio renal syndrome -Son and daughter were persistent and would like to give trial of dialysis to patient. They do understand the risk and complications involved in the process of dialysis. -Nephrology c/s, appreciate recommendations, Dr. Candiss Norse following - patient to receive first dialysis treatment 02/27/2018, will monitor patient's ability to tolerate due to her frail status and critical illness. Goal for daily dialysis 3-4 days per Nephrology - daily labs CBC, BMP -vascular access obtained today for HD  2. Acute on chronic mild systolic, diastolic congestive heart failure with moderate MR and bilateral pleural effusion with and [place] and hypo albumin anemia -continue cardiac meds  3. Dysphagia secondary to achalasia. Patient failed Botox and status post peg tube feeding -patient has been having symptoms of vomiting. She has been getting tube feeding at 20 mL an hour.  -Will resume tube feeding, dietitian consultation placed and recommended nutrition ordered (Osmolite).  4. Type II diabetes -sliding scale insulin  5. History of left temporal and frontal punctate CVA -continue Plavix and Lipitor  6. Status post non-STEMI -continue Lipitor Plavix and Lopressor  7. Lymph node within umbilical hernia, son declined core biopsy according to [facility] clinical notes General surgery has evaluated at [facility], no further recommendations at this time.---will need to readdress with son to r/o malignancy  8. Deconditioning, frailty -PT evaluation and treatment ordered, as tolerated   All the records are reviewed and case  is discussed with Care Management/Social Worker. Management plans discussed with the patient and/or family and they are in  agreement.  CODE STATUS: Full Code  TOTAL TIME TAKING CARE OF THIS PATIENT: 30 minutes.   More than 50% of the time was spent in counseling/coordination of care: YES  POSSIBLE D/C 3-4 days, DEPENDING ON CLINICAL CONDITION FOLLOWING DIALYSIS.   Jolan Upchurch PA-C on 02/27/2018 at 1:08 PM  Between 7am to 6pm - Pager - [phone]  After 6 pm go to [email or web address] - Proofreader   [facility]  Office  [phone]  CC: Primary care physician; Deland Pretty, MD

## 2018-02-27 NOTE — Care Management (Signed)
Amanda Morris dialysis liaison notified of admission.    

## 2018-02-27 NOTE — Progress Notes (Signed)
[place], [place] 02/27/18  Subjective:   Patient remains critically ill.  Not able to provide any meaningful information.  Very lethargic. Dr. Mayer Masker, her son, is at bedside.  Objective:  Vital signs in last 24 hours:  Temp:  [95.9 F (35.5 C)-97.5 F (36.4 C)] 97.5 F (36.4 C) (01/27 1624) Pulse Rate:  [58-78] 58 (01/28 0522) Resp:  [16-20] 18 (01/28 0522) BP: (104-128)/(51-61) 108/60 (01/28 0522) SpO2:  [100 %] 100 % (01/28 0522)  Weight change:  There were no vitals filed for this visit.  Intake/Output:    Intake/Output Summary (Last 24 hours) at 02/27/2018 1058 Last data filed at 02/27/2018 0522 Gross per 24 hour  Intake -  Output 250 ml  Net -250 ml     Physical Exam: General:  Chronically ill-appearing, frail, elderly, in bed  HEENT  dry oral mucous membranes  Neck  distended neck veins  Pulm/lungs  Limited exam, diffuse bilateral crackles  CVS/Heart  no rub, irregular  Abdomen:   Soft, nontender, nondistended  Extremities:  2-3+ pitting edema up to lower abdomen  Neurologic:  Hard of hearing, not able to answer any questions  Skin:  No acute rashes  Access:  Right femoral dialysis catheter       Basic Metabolic Panel:  Recent Labs  Lab 02/22/18 1034 02/23/18 0410 02/24/18 0606 02/25/18 0500 02/26/18 0500 02/26/18 2040 02/27/18 0902  NA 127* 126* 128* 129* 127*  --  125*  K 4.9 4.5 4.2 4.3 4.4  --  4.6  CL 93* 92* 93* 93* 91*  --  93*  CO2 20* 21* 22 21* 20*  --  21*  GLUCOSE 95 121* 130* 144* 174*  --  126*  BUN 155* 153* 149* 150* 147*  --  152*  CREATININE 4.26* 4.33* 4.35* 4.48* 4.39* 4.36* 4.47*  CALCIUM 8.9 8.9 8.7* 8.5* 8.6*  --  8.3*  PHOS 5.5* 5.2* 5.2* 5.1* 4.8*  --   --      CBC: Recent Labs  Lab 02/21/18 0433 02/26/18 2040  WBC 5.5 5.9  HGB 8.0* 8.5*  HCT 26.0* 27.5*  MCV 95.2 96.5  PLT 207 246      Lab Results  Component Value Date   HEPBSAG Negative 02/26/2018   HEPBSAB Non Reactive  02/26/2018   HEPBIGM Negative 02/26/2018      Microbiology:  Recent Results (from the past 240 hour(s))  MRSA PCR Screening     Status: Abnormal   Collection Time: 02/26/18  5:39 PM  Result Value Ref Range Status   MRSA by PCR POSITIVE (A) NEGATIVE Final    Comment:        The GeneXpert MRSA Assay (FDA approved for NASAL specimens only), is one component of a comprehensive MRSA colonization surveillance program. It is not intended to diagnose MRSA infection nor to guide or monitor treatment for MRSA infections. CRITICAL RESULT CALLED TO, READ BACK BY AND VERIFIED WITH:  CALLED TO DANA DUGGINS RN @1920  Performed at [facility], [address] [place], [place] [address]     Coagulation Studies: No results for input(s): LABPROT, INR in the last 72 hours.  Urinalysis: No results for input(s): COLORURINE, LABSPEC, PHURINE, GLUCOSEU, HGBUR, BILIRUBINUR, KETONESUR, PROTEINUR, UROBILINOGEN, NITRITE, LEUKOCYTESUR in the last 72 hours.  Invalid input(s): APPERANCEUR    Imaging: No results found.   Medications:   . feeding supplement (OSMOLITE 1.5 CAL)     . atorvastatin  40 mg Per Tube q1800  .  B-complex with vitamin C  1 tablet Per Tube Daily  . Chlorhexidine Gluconate Cloth  6 each Topical Q0600  . cholecalciferol  400 Units Per Tube Daily  . clopidogrel  75 mg Per Tube Daily  . collagenase   Topical Daily  . famotidine  10 mg Per Tube Daily  . ferrous sulfate  325 mg Oral Q breakfast  . free water  20 mL Per Tube Q4H  . heparin  5,000 Units Subcutaneous Q8H  . isosorbide-hydrALAZINE  0.5 tablet Per Tube TID  . metoprolol tartrate  25 mg Per Tube BID  . [START ON 02/28/2018] multivitamin  15 mL Per Tube Daily  . patiromer  8.4 g Oral Daily   acetaminophen **OR** acetaminophen, albuterol, ipratropium, Melatonin, nitroGLYCERIN, ondansetron, promethazine  Assessment/ Plan:  85 y.o. African-American female with multiple comorbidities including diabetes  type 2, hypertension, hyperlipidemia, coronary disease status post CABG, history of stroke, CKD, was admitted on 02/26/2018. Originally admitted to [facility] with Achalasia and 50 pound weight loss, dysarthria  In NOV 2019  1.  Acute kidney injury on chronic kidney disease stage III.  Baseline creatinine probably 1.1 from nov 2019 2.  Malnutrition causing weight loss, now has G-tube 3.  Anasarca and pulmonary edema 4.  Chronic systolic congestive heart failure EF 30%.  CAD status post CABG 5.  Stroke with left-sided weakness 6. Uremia, likely causing altered mental status 7. Enlarging lymph node in umblical hernia   Patient received right femoral temporary dialysis catheter yesterday on January 27.  Today, patient will receive her first dialysis treatment and we will see how that goes.  Explained to son again that due to her fragile health and multiple comorbidities, she may or may not be able to tolerate it.  So far her blood pressure appears to be stable.  Goal is to give daily dialysis for clearing uremia and removing volume as tolerated.  Next hemodialysis treatment planned for tomorrow.   LOS: [place] 1/28/202010:58 AM  [place], [place]  Note: This note was prepared with Dragon dictation. Any transcription errors are unintentional

## 2018-02-27 NOTE — Progress Notes (Signed)
Patient's daughter, [name] at beside.  RN reviewed scheduled mediations and confirmed specific medications family had requested to be held.  Per off going nurse family request that Heparin, Lopressor, and isosorbide-hydralazine be held until further directives from Dr. Candiss Norse.  [name], Dr. Mayer Masker called and questioned current orders, placement of feeding tube, and requested confirmation by radiology.   [name] requested on call physician asses his mother tonight. This RN acknowledged son's concerns, verbalized intention of discussing with hospitalist on call and would inform his sister once any changes in current plan of care are obtained.  CBass, RN, BN

## 2018-02-27 NOTE — Progress Notes (Signed)
Per family request Hold heparin, Lopressor, and isosorbide-hydralazine until speaking with Nephrology

## 2018-02-27 NOTE — Progress Notes (Signed)
Nutrition Brief Note   Met with pt's son in room today. Discussed tube feeding formulary and which formulas would best meet pt's micro and macronutrient needs. Explained pt with increased estimated needs now with transition to HD. Pt's son previously requesting Nepro tube feeds.   Son's concerns are:  -High protein given pt's poor renal function: RD explained recommendations are >1.2g/kg of protein for HD initiation, goal is to preserve lean muscle and prevent malnutrition. Osmolite 1.5 has lower protein per 1L of formula and meets pt's needs better than Nepro. Pt with normal electrolytes, does not require restrictions. Pt's IBW used to calculate needs as pt up >50lbs from her UBW of 46kg.   -Pt's low albumin. Albumin has a half-life of 21 days and is strongly affected by stress response and inflammatory process, therefore, this is not used to diagnose malnutrition and we should not expect to see an improvement in this lab value during acute hospitalization.  -Pt's hyponatremia and volume overload. Pt ordered for minimal free water flushes to maintain tube patency. Recommended fluid intake for HD patients is UOP + 1L/day. Tube feeding formula along with free water flushes provided 854m of free water per day, allowing room for free water flushes with medications.   -Pt's volume tolerance and reflux secondary to botox in her LES- [facility]; will continue this while here. Son does not wish for pt to get more than 439mhr of tube feeds; goal rate for Osmolite 1.5 will be 4041mr and will meet 100% of pt's estimated needs.   -Pt's hyperglycemia- Nepro is carb study, son concerned that Osmolite may cause pt's blood sugars to increase as this is not carb steady. Explained to pt that we need >130g/day of carbohydrate for brain function. Nepro with provide 155g/day of carbohydrate and Osmolite will provide 195g/day of carbohydrate better meeting pt's needs (55% of pt's  1600kcal/day would require 220g of carbohydrate per day).  Son upset that pt was left on levemir after tube feed formula switched to carb steady formula; will add on sliding scale insulin to cover pt.  Pt's son agrees to proceed with Osmolite 1.5 tube feeds; Will initiate after HD today and try to advance to goal rate within 24 hrs. Explained to son that pt's labs and weights will be monitored daily and changes can be made as needed.   CasKoleen Distance, RD, LDN Pager #- [phone]#- [phone] Hours Pager: [phone]

## 2018-02-27 NOTE — Progress Notes (Signed)
HD initiated via R Fem HD catheter without issue. No heparin treatment. UF goal 1L. Patient does not respond to me. Groans occasionally. All vitals stable. Sats 100% on 3L [place].

## 2018-02-27 NOTE — Progress Notes (Signed)
Pre hd 

## 2018-02-27 NOTE — Progress Notes (Signed)
Off going RN, Hortencia Pilar reports "Per family request Hold heparin, Lopressor, and isosorbide-hydralazine".

## 2018-02-27 NOTE — Progress Notes (Signed)
Ethics Consult Note  Received ethics consult from [place], [place].  85 year old female with PMHx of HTN, CAD s/p stenting in 2004 and CABG x4 in 2011, cardiomyopathy (LV EF 35-50% on 11/09/2017; LV EF 30% 12/21/2017), DM type 2, CKD stage III, hx CVA in 08/2016, achalasia diagnosed 11/20/2017 s/p botulinum toxin who was admitted to [facility] on 12/05/2017 from [facility] with dysarthria (MRI negative for CVA), AMS, FTT, UTI, stage II pressure ulcer to sacrum (present on admission), was on TPN 11/10 to 11/13 due to scheduling issues with G-tube placement and prolonged NPO status, s/p G-tube placement on 12/10/2017 by IR, developed acute respiratory failure with hypoxia secondary to pulmonary edema on 12/21/2017 (+24 liters at that time) and NSTEMI, had emesis on 12/23/2017 after bolus tube feeding with suspected aspiration, patient developed respiratory distress with agonal respirations and was intubated 01/01/2018-01/05/2018, found to have left temporal and frontal CVA on repeat MRI 01/03/2018.  Palliative Medicine was consulted at [facility] and son wanted to keep patient full code/full scope of care. Ethics committee was also consulted at [facility]. [facility] were contacted for possible transfer but they were unable to take patient. The topic of futility was brought up in an ethics meeting, but physicians providing care for patient felt dialysis could not be declared as a futile treatment and were leaning towards a trial of dialysis. Patient was accepted as a transfer to [facility].  Patient does not have an advanced directive. Patient lacks capacity to make decisions. Son and daughter are decision makers for patient.  Discussed with RNCM and Nephrologist. Plan is for short-term trial of HD (2 weeks or less) to determine if patient will benefit. This expectation has already been discussed with patient's family.  Considerations: -Recommend continued open dialogue among care team and family  regarding patient's prognosis and tolerance of short-term trial of dialysis. This will allow for collaborative decision-making to best meet the needs of the patient. -If needed a member of the Ethics Committee can meet with team and family to discuss case. -If team members feel the [place] needs to be invoked, Ethics Committee can assist in its implementation. Policy can be found in [employer] and procedures.  Willey Blade, MS, [place], LDN Office: [phone] Pager: [phone] After Hours/Weekend Pager: [phone]

## 2018-02-27 NOTE — Progress Notes (Signed)
Patient taken to procedural area by orderly.

## 2018-02-27 NOTE — Progress Notes (Signed)
Patient's rectal temp 96.  Spoke with Hospitalist, S. Patel MD. Patient has averaged around 96 for 24 hours, Patient's continues to be asymptotic, continue to monitor with no changes to current orders or plan of care.

## 2018-02-27 NOTE — Progress Notes (Signed)
PT Cancellation Note  Patient Details Name: [name] MRN: [id number] DOB: [birth year]   Cancelled Treatment:    Reason Eval/Treat Not Completed: Patient's level of consciousness Spoke with nursing before attempting to see and she reports "Your services will not be possible today." Pt apparently very sleepy and not appropriate for PT to try seeing this date.  Pt apparently to trial HD (had PICC line placed) and there is talk of a feeding tube.  Will try back at a later date when pt is more appropriate.  Kreg Shropshire, DPT 02/27/2018, 11:50 AM

## 2018-02-28 LAB — CBC
HCT: 23.8 % — ABNORMAL LOW (ref 36.0–46.0)
Hemoglobin: 7.3 g/dL — ABNORMAL LOW (ref 12.0–15.0)
MCH: 30 pg (ref 26.0–34.0)
MCHC: 30.7 g/dL (ref 30.0–36.0)
MCV: 97.9 fL (ref 80.0–100.0)
Platelets: 230 10*3/uL (ref 150–400)
RBC: 2.43 MIL/uL — AB (ref 3.87–5.11)
RDW: 15.9 % — ABNORMAL HIGH (ref 11.5–15.5)
WBC: 6.4 10*3/uL (ref 4.0–10.5)
nRBC: 0 % (ref 0.0–0.2)

## 2018-02-28 LAB — BASIC METABOLIC PANEL
Anion gap: 10 (ref 5–15)
BUN: 113 mg/dL — ABNORMAL HIGH (ref 8–23)
CHLORIDE: 95 mmol/L — AB (ref 98–111)
CO2: 25 mmol/L (ref 22–32)
Calcium: 8.3 mg/dL — ABNORMAL LOW (ref 8.9–10.3)
Creatinine, Ser: 3.77 mg/dL — ABNORMAL HIGH (ref 0.44–1.00)
GFR calc Af Amer: 12 mL/min — ABNORMAL LOW (ref >60)
GFR calc non Af Amer: 10 mL/min — ABNORMAL LOW (ref >60)
Glucose, Bld: 151 mg/dL — ABNORMAL HIGH (ref 70–99)
POTASSIUM: 3.8 mmol/L (ref 3.5–5.1)
Sodium: 130 mmol/L — ABNORMAL LOW (ref 135–145)

## 2018-02-28 LAB — PHOSPHORUS: Phosphorus: 4.5 mg/dL (ref 2.5–4.6)

## 2018-02-28 LAB — GLUCOSE, CAPILLARY
Glucose-Capillary: 126 mg/dL — ABNORMAL HIGH (ref 70–99)
Glucose-Capillary: 129 mg/dL — ABNORMAL HIGH (ref 70–99)
Glucose-Capillary: 135 mg/dL — ABNORMAL HIGH (ref 70–99)
Glucose-Capillary: 154 mg/dL — ABNORMAL HIGH (ref 70–99)

## 2018-02-28 MED ORDER — OSMOLITE 1.5 CAL PO LIQD
1000.0000 mL | ORAL | Status: DC
  Administered 2018-02-28: 1000 mL

## 2018-02-28 MED ORDER — LIP MEDEX EX OINT
TOPICAL_OINTMENT | CUTANEOUS | Status: DC | PRN
  Filled 2018-02-28 (×2): qty 7

## 2018-02-28 MED ORDER — BLISTEX MEDICATED EX OINT
TOPICAL_OINTMENT | CUTANEOUS | Status: DC | PRN
  Administered 2018-02-28 – 2018-03-03 (×3): via TOPICAL
  Filled 2018-02-28 (×2): qty 6.3

## 2018-02-28 MED ORDER — ORAL CARE MOUTH RINSE
15.0000 mL | Freq: Two times a day (BID) | OROMUCOSAL | Status: DC
  Administered 2018-03-01 – 2018-03-31 (×44): 15 mL via OROMUCOSAL

## 2018-02-28 MED ORDER — CHLORHEXIDINE GLUCONATE CLOTH 2 % EX PADS
6.0000 | MEDICATED_PAD | Freq: Every day | CUTANEOUS | Status: DC
  Administered 2018-03-02 – 2018-03-04 (×3): 6 via TOPICAL

## 2018-02-28 MED ORDER — CHLORHEXIDINE GLUCONATE 0.12 % MT SOLN
15.0000 mL | Freq: Two times a day (BID) | OROMUCOSAL | Status: DC
  Administered 2018-02-28 – 2018-04-02 (×60): 15 mL via OROMUCOSAL
  Filled 2018-02-28 (×60): qty 15

## 2018-02-28 MED ORDER — MUPIROCIN 2 % EX OINT
1.0000 "application " | TOPICAL_OINTMENT | Freq: Two times a day (BID) | CUTANEOUS | Status: AC
  Administered 2018-02-28 – 2018-03-04 (×10): 1 via NASAL
  Filled 2018-02-28: qty 22

## 2018-02-28 NOTE — Progress Notes (Signed)
Pre HD Assessment    02/28/18 0900  Neurological  Level of Consciousness Responds to Voice  Orientation Level Disoriented X4  Respiratory  Respiratory Pattern Regular;Unlabored  Chest Assessment Chest expansion symmetrical  Bilateral Breath Sounds Diminished  Cardiac  Pulse Regular  Heart Sounds S1, S2  ECG Monitor Yes  Cardiac Rhythm NSR  Vascular  R Radial Pulse +2  L Radial Pulse +2  Edema Generalized  Generalized Edema +3  Psychosocial  Psychosocial (WDL) X  Patient Behaviors Not interactive

## 2018-02-28 NOTE — Progress Notes (Signed)
[place], [place] 02/28/18  Subjective:   Patient remains critically ill.  Not able to provide any meaningful information.  Few Moans. Very lethargic. Seen during dialysis.    HEMODIALYSIS FLOWSHEET:  Blood Flow Rate (mL/min): 250 mL/min Arterial Pressure (mmHg): -70 mmHg Venous Pressure (mmHg): 90 mmHg Transmembrane Pressure (mmHg): 50 mmHg Ultrafiltration Rate (mL/min): 750 mL/min Dialysate Flow Rate (mL/min): 500 ml/min Conductivity: Machine : 14.2 Conductivity: Machine : 14.2 Dialysis Fluid Bolus: (P) Normal Saline Bolus Amount (mL): (P) 250 mL Dialysate Change: (3k 2.5ca)    Objective:  Vital signs in last 24 hours:  Temp:  [96 F (35.6 C)-97.4 F (36.3 C)] (P) 97.1 F (36.2 C) (01/29 0928) Pulse Rate:  [67-84] 84 (01/29 1045) Resp:  [0-22] 15 (01/29 1045) BP: (82-131)/(26-75) 116/51 (01/29 1045) SpO2:  [100 %] 100 % (01/29 1045) Weight:  [83.5 kg-87 kg] (P) 83.5 kg (01/29 0928)  Weight change:  Filed Weights   02/27/18 1608 02/28/18 0634 02/28/18 0928  Weight: 86 kg 83.6 kg (P) 83.5 kg    Intake/Output:    Intake/Output Summary (Last 24 hours) at 02/28/2018 1052 Last data filed at 02/28/2018 0600 Gross per 24 hour  Intake 120 ml  Output 1060 ml  Net -940 ml     Physical Exam: General:  Chronically ill-appearing, frail, elderly, in bed  HEENT  dry oral mucous membranes  Neck  distended neck veins  Pulm/lungs  Limited exam,mild basilar crackles  CVS/Heart  no rub, irregular  Abdomen:   Soft, nontender, nondistended  Extremities:  2-3+ pitting edema up to lower abdomen  Neurologic:  Hard of hearing, not able to answer any questions  Skin:  No acute rashes  Access:  Right femoral dialysis catheter   Foley in place    Basic Metabolic Panel:  Recent Labs  Lab 02/23/18 0410 02/24/18 0606 02/25/18 0500 02/26/18 0500 02/26/18 2040 02/27/18 0902 02/28/18 0501  NA 126* 128* 129* 127*  --  125* 130*  K 4.5 4.2 4.3  4.4  --  4.6 3.8  CL 92* 93* 93* 91*  --  93* 95*  CO2 21* 22 21* 20*  --  21* 25  GLUCOSE 121* 130* 144* 174*  --  126* 151*  BUN 153* 149* 150* 147*  --  152* 113*  CREATININE 4.33* 4.35* 4.48* 4.39* 4.36* 4.47* 3.77*  CALCIUM 8.9 8.7* 8.5* 8.6*  --  8.3* 8.3*  PHOS 5.2* 5.2* 5.1* 4.8*  --   --  4.5     CBC: Recent Labs  Lab 02/26/18 2040 02/28/18 0501  WBC 5.9 6.4  HGB 8.5* 7.3*  HCT 27.5* 23.8*  MCV 96.5 97.9  PLT 246 230      Lab Results  Component Value Date   HEPBSAG Negative 02/26/2018   HEPBSAB Non Reactive 02/26/2018   HEPBIGM Negative 02/26/2018      Microbiology:  Recent Results (from the past 240 hour(s))  MRSA PCR Screening     Status: Abnormal   Collection Time: 02/26/18  5:39 PM  Result Value Ref Range Status   MRSA by PCR POSITIVE (A) NEGATIVE Final    Comment:        The GeneXpert MRSA Assay (FDA approved for NASAL specimens only), is one component of a comprehensive MRSA colonization surveillance program. It is not intended to diagnose MRSA infection nor to guide or monitor treatment for MRSA infections. CRITICAL RESULT CALLED TO, READ BACK BY AND VERIFIED WITH:  CALLED TO DANA DUGGINS RN @  1920 Performed at [facility], [address] [place], [place] [address]     Coagulation Studies: No results for input(s): LABPROT, INR in the last 72 hours.  Urinalysis: No results for input(s): COLORURINE, LABSPEC, PHURINE, GLUCOSEU, HGBUR, BILIRUBINUR, KETONESUR, PROTEINUR, UROBILINOGEN, NITRITE, LEUKOCYTESUR in the last 72 hours.  Invalid input(s): APPERANCEUR    Imaging: Dg Abd 1 View  Result Date: 02/28/2018 CLINICAL DATA:  Feeding tube placement EXAM: ABDOMEN - 1 VIEW COMPARISON:  None. FINDINGS: Right groin catheter tip to the right of L2. Feeding tube tip may be curled back upon itself over the central abdomen. Extensive vascular calcification. Nonobstructed gas pattern. IMPRESSION: No descending enteral tube is visualized.  Presumed percutaneous feeding tube over the central abdomen with poorly visible tip which may be folded back upon itself. Electronically Signed   By: Donavan Foil M.D.   On: 02/28/2018 00:50     Medications:   . feeding supplement (OSMOLITE 1.5 CAL) 1,000 mL (02/27/18 1657)   . atorvastatin  40 mg Per Tube q1800  . B-complex with vitamin C  1 tablet Per Tube Daily  . Chlorhexidine Gluconate Cloth  6 each Topical Q0600  . Chlorhexidine Gluconate Cloth  6 each Topical Q0600  . cholecalciferol  400 Units Per Tube Daily  . clopidogrel  75 mg Per Tube Daily  . collagenase   Topical Daily  . famotidine  10 mg Per Tube Daily  . feeding supplement (PRO-STAT SUGAR FREE 64)  30 mL Per Tube BID  . ferrous sulfate  325 mg Oral Q breakfast  . free water  20 mL Per Tube Q4H  . heparin  5,000 Units Subcutaneous Q8H  . insulin aspart  0-15 Units Subcutaneous Q6H  . multivitamin  15 mL Per Tube Daily  . mupirocin ointment  1 application Nasal BID  . pantoprazole sodium  40 mg Per Tube Daily  . patiromer  8.4 g Oral Daily   acetaminophen **OR** acetaminophen, albuterol, ipratropium, Melatonin, nitroGLYCERIN, ondansetron, promethazine  Assessment/ Plan:  85 y.o. African-American female with multiple comorbidities including diabetes type 2, hypertension, hyperlipidemia, coronary disease status post CABG, history of stroke, CKD, was admitted on 02/26/2018. Originally admitted to [facility] with Achalasia and 50 pound weight loss, dysarthria  In NOV 2019  1.  Acute kidney injury on chronic kidney disease stage III.  Baseline creatinine probably 1.1 from nov 2019 2.  Malnutrition causing weight loss, now has G-tube 3.  Anasarca and pulmonary edema 4.  Chronic systolic congestive heart failure EF 30%.  CAD status post CABG 5.  Stroke with left-sided weakness 6.  Uremia, likely causing altered mental status 7.  Enlarging lymph node in umblical hernia   Patient is getting her 2nd dialysis  treatment today in bed. Tolerating so far. UF goal 1-1.5 kg. BUN level is lower.  No improvement in mental status so far. Goal is to give daily dialysis for clearing uremia and removing volume as tolerated.  Next hemodialysis treatment planned for tomorrow.   LOS: [place] 1/29/202010:52 AM  [place], [place]  Note: This note was prepared with Dragon dictation. Any transcription errors are unintentional

## 2018-02-28 NOTE — Progress Notes (Signed)
[place] at [employer] NAME: [name]    MR#:  [id number]  DATE OF BIRTH:  April 26, 1927  SUBJECTIVE:  CHIEF COMPLAINT:  No chief complaint on file.  Remains somnolent and unable to communicate, moans when examined and intermittently while moving arms in bed. Daughter at bedside.  REVIEW OF SYSTEMS:  Unable to perform ROS due to non-communicative patient.  DRUG ALLERGIES:   Allergies  Allergen Reactions  . Contrast Media [Iodinated Diagnostic Agents]     Sensitivity, kidney issues   VITALS:  Blood pressure (!) 117/54, pulse 76, temperature (!) 97.1 F (36.2 C), temperature source Axillary, resp. rate (!) 7, weight 83.5 kg, SpO2 100 %. PHYSICAL EXAMINATION:  GENERAL: asleep, somnolent when roused. Not interactive but responds to voice.  HEAD: Atraumatic, normocephalic. NECK: Supple. Distended neck veins. HEART: Regular rate and rhythm. No murmurs, no rubs, no clicks.  LUNGS: Limited exam due to somnolence. Clear to auscultation anteriorly. ABDOMEN: Soft, nontender, anasarca present. Bowel sounds normoactive. G tube in place. EXTREMITIES: No evidence of any cyanosis, clubbing, +2 radial pulses bilaterally. 3+ pitting edema bilaterally in upper and lower extremities. NEUROLOGIC: Hard of hearing, not able to answer any questions. SKIN: Warm with no acute rashes. ACCESS: Right femoral dialysis catheter GU: Foley catheter in place  LABORATORY PANEL:  Female CBC Recent Labs  Lab 02/28/18 0501  WBC 6.4  HGB 7.3*  HCT 23.8*  PLT 230   ------------------------------------------------------------------------------------------------------------------ Chemistries  Recent Labs  Lab 02/28/18 0501  NA 130*  K 3.8  CL 95*  CO2 25  GLUCOSE 151*  BUN 113*  CREATININE 3.77*  CALCIUM 8.3*   RADIOLOGY:  Dg Abd 1 View  Result Date: 02/28/2018 CLINICAL DATA:  Feeding tube placement EXAM: ABDOMEN - 1 VIEW COMPARISON:  None. FINDINGS:  Right groin catheter tip to the right of L2. Feeding tube tip may be curled back upon itself over the central abdomen. Extensive vascular calcification. Nonobstructed gas pattern. IMPRESSION: No descending enteral tube is visualized. Presumed percutaneous feeding tube over the central abdomen with poorly visible tip which may be folded back upon itself. Electronically Signed   By: Donavan Foil M.D.   On: 02/28/2018 00:50   ASSESSMENT AND PLAN:   [name] is an 85 y.o.African-American femalewith multiple comorbidities including diabetes type 2, hypertension, hyperlipidemia, coronary disease status post CABG, history of stroke, CKD, was admitted on1/27/2020. Originally admitted to  hospitalwith dysphagia and achalasiaand 50 pound weight loss, 12/2017. Admitted for dialysis.   1. Acute kidney injury on chronic kidney disease stage III. Uremia, likely causing altered mental status/acute metabolic encephalopathy - complicated by CHF, poor nutrition, cardio renal syndrome. Son and daughter were persistent and would like to give trial of dialysis to patient. They do understand the risk and complications involved in the process of dialysis. - Nephrology following - patient received dialysis treatment 02/27/2018, 02/28/2018 tolerated well. Will continue to monitor patient's ability to tolerate due to her frail status and critical illness. Goal for daily dialysis 3-4 days total per Nephrology - daily labs CBC, BMP - vascular access in place for HD  2.Acute on chronic mild systolic, diastolic congestive heart failure with moderate MR and bilateral pleural effusion with and Asarco and hypo albumin anemia -continuecardiac meds  3.Dysphagia secondary to achalasia. Patient failed Botox and status post peg tube feeding - patient has been having symptoms of vomiting. She has been getting tube feeding at 20 mL an hour.  - continue  Tube feeding with Osmolite  4.Type II diabetes -sliding  scale insulin  5.History of left temporal and frontal punctate CVA -continue Plavix and Lipitor  6.Status post non-STEMI -continue Lipitor Plavix and Lopressor  7. Lymph node within umbilical hernia, son declined core biopsyaccording to [place] [facility]General [facility] no further recommendations at this time.---will need to readdress with son to r/o malignancy  8. Deconditioning, frailty - PT to evaluate and treatment ordered, as tolerated   All the records are reviewed and case is discussed with Care Management/Social Worker. Management plans discussed by Dr. Max Sane with the patient's family and they are in agreement.  CODE STATUS: Full Code  TOTAL TIME TAKING CARE OF THIS PATIENT: 30 minutes.   More than 50% of the time was spent in counseling/coordination of care: YES  POSSIBLE D/C IN 2-3 DAYS, DEPENDING ON CLINICAL CONDITION.   Angelica Wix PA-C on 02/28/2018 at 12:12 PM  Between 7am to 6pm - Pager - [phone]  After 6 pm go to [email or web address] - Proofreader  [facility]  Office  [phone]  CC: Primary care physician; Deland Pretty, MD

## 2018-02-28 NOTE — Progress Notes (Signed)
Post HD Assessment    02/28/18 1203  Neurological  Orientation Level Disoriented X4  Respiratory  Respiratory Pattern Regular  Chest Assessment Chest expansion symmetrical  Bilateral Breath Sounds Diminished  Cough None  Cardiac  Pulse Regular  Heart Sounds S1, S2  ECG Monitor Yes  Cardiac Rhythm NSR  Vascular  R Radial Pulse +2  L Radial Pulse +2  Edema Generalized  Generalized Edema +3  Psychosocial  Psychosocial (WDL) X  Patient Behaviors Not interactive

## 2018-02-28 NOTE — Progress Notes (Signed)
HD Tx completed, uf goal met, TX tolerated well.    02/28/18 1200  Hand-Off documentation  Report given to (Full Name) Delbert Harness, RN   Report received from (Full Name) Beatris Ship, RN   Vital Signs  Pulse Rate 77  Pulse Rate Source Monitor  Resp 12  BP 98/65  BP Location Right Wrist  BP Method Automatic  Patient Position (if appropriate) Lying  Oxygen Therapy  SpO2 100 %  O2 Device Nasal Cannula  O2 Flow Rate (L/min) 3 L/min  Pulse Oximetry Type Continuous  During Hemodialysis Assessment  HD Safety Checks Performed Yes  KECN 35.9 KECN  Dialysis Fluid Bolus Normal Saline  Bolus Amount (mL) 250 mL  Intra-Hemodialysis Comments Tx completed;Tolerated well

## 2018-02-28 NOTE — Progress Notes (Signed)
Nutrition Brief Note   Pt tolerated Osmolite 1.5 tube feeds well overnight @ 59ml/hr. Will plan to increase rate to 20ml/hr and titrate up to goal rate of 13ml/hr in 12 hours.   Koleen Distance MS, RD, LDN Pager #- [phone] Office#- [phone] After Hours Pager: [phone]

## 2018-02-28 NOTE — Progress Notes (Signed)
PT Cancellation Note  Patient Details Name: Chanae Gemma MRN: [id number] DOB: 21-Oct-1927   Cancelled Treatment:    Reason Eval/Treat Not Completed: Patient's level of consciousness.  Pt still not responding to staff enough to participate in PT evaluation.  Will re-attempt tomorrow.  Roxanne Gates, PT, DPT  Roxanne Gates 02/28/2018, 4:55 PM

## 2018-02-28 NOTE — Progress Notes (Signed)
Pre HD Assessment, Pt is not interactive, but responds to voice.    02/28/18 0900  Neurological  Level of Consciousness Responds to Voice  Orientation Level Disoriented X4  Respiratory  Respiratory Pattern Regular;Unlabored  Chest Assessment Chest expansion symmetrical  Bilateral Breath Sounds Diminished  Cardiac  Pulse Regular  Heart Sounds S1, S2  ECG Monitor Yes  Cardiac Rhythm NSR  Vascular  R Radial Pulse +2  L Radial Pulse +2  Edema Generalized  Generalized Edema +3  Psychosocial  Psychosocial (WDL) X  Patient Behaviors Not interactive

## 2018-02-28 NOTE — Care Management (Signed)
Case reviewed with physician advisor.  Recommendation for Ethics consult to be placed. RNCM contacted Willey Blade

## 2018-02-28 NOTE — Progress Notes (Signed)
PT Cancellation Note  Patient Details Name: [name] MRN: [id number] DOB: February 17, 1927   Cancelled Treatment:    Reason Eval/Treat Not Completed: Patient's level of consciousness;Patient at procedure or test/unavailable.  Order received.  Chart reviewed. Pt currently off the floor for HD.  Pt also documented to still be unresponsive today during conversation.  Will check back in later to determine appropriateness.  Roxanne Gates, PT, DPT  Roxanne Gates 02/28/2018, 11:08 AM

## 2018-02-28 NOTE — Progress Notes (Signed)
Post HD Tx    02/28/18 1201  Vital Signs  Temp (!) 97.4 F (36.3 C)  Temp Source Axillary  Pulse Rate Source Monitor  Resp 10  BP 98/65  BP Location Right Wrist  BP Method Automatic  Patient Position (if appropriate) Lying  Oxygen Therapy  O2 Device Nasal Cannula  O2 Flow Rate (L/min) 3 L/min  Pulse Oximetry Type Continuous  Pain Assessment  Pain Scale 0-10  Pain Score 0  Dialysis Weight  Weight 82.5 kg  Type of Weight Post-Dialysis  Post-Hemodialysis Assessment  Rinseback Volume (mL) 250 mL  KECN 35.9 V  Dialyzer Clearance Lightly streaked  Duration of HD Treatment -hour(s) 2.5 hour(s)  Hemodialysis Intake (mL) 500 mL  UF Total -Machine (mL) 1501 mL  Net UF (mL) 1001 mL  Tolerated HD Treatment Yes  Hemodialysis Catheter Right Femoral vein  Placement Date: 02/26/18   Orientation: Right  Access Location: Femoral vein  Site Condition No complications  Blue Lumen Status Heparin locked  Red Lumen Status Heparin locked  Post treatment catheter status Capped and Clamped

## 2018-02-28 NOTE — Progress Notes (Signed)
Patient brought back to room by orderly.  Hospitalist to discuss concerns with daughter as requested.

## 2018-02-28 NOTE — Progress Notes (Signed)
HD Tx started w/o issue    02/28/18 0930  Vital Signs  Pulse Rate 75  Pulse Rate Source Monitor  Resp 11  BP (!) 101/53  BP Location Right Wrist  BP Method Automatic  Patient Position (if appropriate) Lying  Oxygen Therapy  SpO2 100 %  O2 Device Nasal Cannula  O2 Flow Rate (L/min) 3 L/min  Pulse Oximetry Type Continuous  During Hemodialysis Assessment  Blood Flow Rate (mL/min) 250 mL/min  Arterial Pressure (mmHg) -60 mmHg  Venous Pressure (mmHg) 80 mmHg  Transmembrane Pressure (mmHg) 40 mmHg  Ultrafiltration Rate (mL/min) 750 mL/min  Dialysate Flow Rate (mL/min) 500 ml/min  Conductivity: Machine  14  HD Safety Checks Performed Yes  Dialysis Fluid Bolus Normal Saline  Bolus Amount (mL) 250 mL  Intra-Hemodialysis Comments Tx initiated  Hemodialysis Catheter Right Femoral vein  Placement Date: 02/26/18   Orientation: Right  Access Location: Femoral vein  Blue Lumen Status Infusing  Red Lumen Status Infusing

## 2018-02-28 NOTE — Progress Notes (Signed)
Pre HD Tx   02/28/18 0928  Vital Signs  Temp (!) 97.1 F (36.2 C)  Temp Source Axillary  Pulse Rate 75  Pulse Rate Source Monitor  Resp 10  BP (!) 100/52  BP Location Right Wrist  BP Method Automatic  Patient Position (if appropriate) Lying  Oxygen Therapy  SpO2 100 %  O2 Device Nasal Cannula  O2 Flow Rate (L/min) 3 L/min  Pulse Oximetry Type Continuous  PAINAD (Pain Assessment in Advanced Dementia)  Breathing 0  Negative Vocalization 0  Facial Expression 0  Body Language 0  Consolability 0  PAINAD Score 0  Dialysis Weight  Weight 83.5 kg  Type of Weight Pre-Dialysis  Time-Out for Hemodialysis  What Procedure? HD  Pt Identifiers(min of two) First/Last Name;MRN/Account#  Correct Site? Yes  Correct Side? Yes  Correct Procedure? Yes  Consents Verified? Yes  Rad Studies Available? N/A  Safety Precautions Reviewed? Yes  Engineer, civil (consulting) Number 1  Station Number 1  UF/Alarm Test Passed  Conductivity: Meter 14  Conductivity: Machine  14.1  pH 7.4  Reverse Osmosis Main  Normal Saline Lot Number [id number]  Dialyzer Lot Number [id number]  Disposable Set Lot Number [id number]  Machine Temperature 98.6 F (37 C)  Musician and Audible Yes  Blood Lines Intact and Secured Yes  Pre Treatment Patient Checks  Vascular access used during treatment Catheter  Hepatitis B Surface Antigen Results Negative  Date Hepatitis B Surface Antigen Drawn 02/26/18  Hepatitis B Surface Antibody  (<10)  Date Hepatitis B Surface Antibody Drawn 02/26/18  Hemodialysis Consent Verified Yes  Hemodialysis Standing Orders Initiated Yes  ECG (Telemetry) Monitor On Yes  Prime Ordered Normal Saline  Length of  DialysisTreatment -hour(s) 2.5 Hour(s)  Dialysis Treatment Comments Na 140  Dialyzer Elisio 17H NR  Dialysate 3K, 2.5 Ca  Dialysis Anticoagulant None  Dialysate Flow Ordered 500  Blood Flow Rate Ordered 250 mL/min  Ultrafiltration Goal 1 Liters  Dialysis Blood Pressure Support  Ordered Normal Saline  Education / Care Plan  Dialysis Education Provided No (Comment) (Pt is unresponsive, no family present )  Hemodialysis Catheter Right Femoral vein  Placement Date: 02/26/18   Orientation: Right  Access Location: Femoral vein  Site Condition No complications  Blue Lumen Status Flushed  Red Lumen Status Flushed  Dressing Type Biopatch;Occlusive  Dressing Status Clean;Dry;Intact

## 2018-03-01 DIAGNOSIS — T85598A Other mechanical complication of other gastrointestinal prosthetic devices, implants and grafts, initial encounter: Secondary | ICD-10-CM

## 2018-03-01 LAB — BASIC METABOLIC PANEL
Anion gap: 8 (ref 5–15)
BUN: 93 mg/dL — ABNORMAL HIGH (ref 8–23)
CO2: 27 mmol/L (ref 22–32)
CREATININE: 3.32 mg/dL — AB (ref 0.44–1.00)
Calcium: 8 mg/dL — ABNORMAL LOW (ref 8.9–10.3)
Chloride: 98 mmol/L (ref 98–111)
GFR calc Af Amer: 14 mL/min — ABNORMAL LOW (ref >60)
GFR calc non Af Amer: 12 mL/min — ABNORMAL LOW (ref >60)
Glucose, Bld: 165 mg/dL — ABNORMAL HIGH (ref 70–99)
Potassium: 3.7 mmol/L (ref 3.5–5.1)
Sodium: 133 mmol/L — ABNORMAL LOW (ref 135–145)

## 2018-03-01 LAB — PHOSPHORUS: Phosphorus: 4 mg/dL (ref 2.5–4.6)

## 2018-03-01 LAB — GLUCOSE, CAPILLARY
Glucose-Capillary: 155 mg/dL — ABNORMAL HIGH (ref 70–99)
Glucose-Capillary: 168 mg/dL — ABNORMAL HIGH (ref 70–99)
Glucose-Capillary: 180 mg/dL — ABNORMAL HIGH (ref 70–99)

## 2018-03-01 LAB — CBC
HCT: 23.4 % — ABNORMAL LOW (ref 36.0–46.0)
HEMOGLOBIN: 7.1 g/dL — AB (ref 12.0–15.0)
MCH: 29.8 pg (ref 26.0–34.0)
MCHC: 30.3 g/dL (ref 30.0–36.0)
MCV: 98.3 fL (ref 80.0–100.0)
Platelets: 217 10*3/uL (ref 150–400)
RBC: 2.38 MIL/uL — ABNORMAL LOW (ref 3.87–5.11)
RDW: 16.2 % — ABNORMAL HIGH (ref 11.5–15.5)
WBC: 6.4 10*3/uL (ref 4.0–10.5)
nRBC: 0 % (ref 0.0–0.2)

## 2018-03-01 LAB — HEPATITIS C ANTIBODY: HCV Ab: 0.1 s/co ratio (ref 0.0–0.9)

## 2018-03-01 LAB — MAGNESIUM: Magnesium: 2.2 mg/dL (ref 1.7–2.4)

## 2018-03-01 MED ORDER — CHOLECALCIFEROL 10 MCG (400 UNIT) PO TABS
400.0000 [IU] | ORAL_TABLET | Freq: Every day | ORAL | Status: DC
  Administered 2018-03-02 – 2018-03-04 (×3): 400 [IU]
  Filled 2018-03-01 (×5): qty 1

## 2018-03-01 MED ORDER — JUVEN PO PACK
1.0000 | PACK | Freq: Two times a day (BID) | ORAL | Status: DC
  Administered 2018-03-02: 1 via ORAL

## 2018-03-01 MED ORDER — FERROUS SULFATE 220 (44 FE) MG/5ML PO ELIX
220.0000 mg | ORAL_SOLUTION | Freq: Every day | ORAL | Status: DC
  Administered 2018-03-02 – 2018-03-06 (×4): 220 mg via ORAL
  Filled 2018-03-01 (×6): qty 5

## 2018-03-01 MED ORDER — ADULT MULTIVITAMIN LIQUID CH
15.0000 mL | Freq: Every day | ORAL | Status: DC
  Administered 2018-03-02 – 2018-04-02 (×28): 15 mL
  Filled 2018-03-01 (×36): qty 15

## 2018-03-01 MED ORDER — POLYETHYLENE GLYCOL 3350 17 G PO PACK
17.0000 g | PACK | Freq: Every day | ORAL | Status: DC
  Administered 2018-03-01 – 2018-03-14 (×8): 17 g via ORAL
  Filled 2018-03-01 (×15): qty 1

## 2018-03-01 MED ORDER — OSMOLITE 1.5 CAL PO LIQD
1000.0000 mL | ORAL | Status: DC
  Administered 2018-03-01 – 2018-03-02 (×2): 1000 mL

## 2018-03-01 MED ORDER — CLOPIDOGREL BISULFATE 75 MG PO TABS: 75.0000 mg | ORAL_TABLET | Freq: Every day | ORAL | Status: DC

## 2018-03-01 NOTE — Progress Notes (Signed)
Less than 10 cc residual prior to feed. Patient HOB elevated above 30 degrees. Daughter at bedside seems to think patient is tolerating feeds without issue. See I&O flowsheet for recorded volumes. Will continue to monitor.

## 2018-03-01 NOTE — Progress Notes (Signed)
[place] at [place] NAME: [name]    MR#:  [id number]  DATE OF BIRTH:  1927/08/29  SUBJECTIVE:  CHIEF COMPLAINT:  No chief complaint on file.  Remains somnolent and unable to communicate, moans when examined and intermittently while moving arms in bed. Brief periods of opening eyes this morning. Daughter at bedside.  2 L fluid removed so far, HD x 2 days with third session planned for today.   REVIEW OF SYSTEMS:  Unable to perform ROS due to non-communicative patient.  DRUG ALLERGIES:   Allergies  Allergen Reactions  . Contrast Media [Iodinated Diagnostic Agents]     Sensitivity, kidney issues   VITALS:  Blood pressure 117/66, pulse 88, temperature 98.5 F (36.9 C), temperature source Oral, resp. rate 20, weight 84.6 kg, SpO2 98 %. PHYSICAL EXAMINATION:  GENERAL: asleep with brief periods of waking, somnolent when roused. Not interactive but responds to voice.  HEAD: Atraumatic, normocephalic. NECK: Supple. Distended neck veins. HEART: Regular rate and rhythm. No murmurs, no rubs, no clicks.  LUNGS: Limited exam due to somnolence. Clear to auscultation anteriorly. ABDOMEN: Soft, nontender, anasarca present. Bowel sounds normoactive. EXTREMITIES: No evidence of any cyanosis, clubbing, +2 radial pulses bilaterally. 3+ pitting edema bilaterally in upper and lower extremities. NEUROLOGIC: Hard of hearing, not able to answer any questions. SKIN: Warm with no acute rashes. ACCESS: Right femoral dialysis catheter GU: Foley catheter in place LABORATORY PANEL:  Female CBC Recent Labs  Lab 03/01/18 0418  WBC 6.4  HGB 7.1*  HCT 23.4*  PLT 217   ------------------------------------------------------------------------------------------------------------------ Chemistries  Recent Labs  Lab 03/01/18 0418  NA 133*  K 3.7  CL 98  CO2 27  GLUCOSE 165*  BUN 93*  CREATININE 3.32*  CALCIUM 8.0*  MG 2.2    Intake/Output  Summary (Last 24 hours) at 03/01/2018 1506 Last data filed at 03/01/2018 1048 Gross per 24 hour  Intake 1235.67 ml  Output 275 ml  Net 960.67 ml     RADIOLOGY:  Dg Abd 1 View  Result Date: 02/28/2018 CLINICAL DATA:  Feeding tube placement EXAM: ABDOMEN - 1 VIEW COMPARISON:  None. FINDINGS: Right groin catheter tip to the right of L2. Feeding tube tip may be curled back upon itself over the central abdomen. Extensive vascular calcification. Nonobstructed gas pattern. IMPRESSION: No descending enteral tube is visualized. Presumed percutaneous feeding tube over the central abdomen with poorly visible tip which may be folded back upon itself. Electronically Signed   By: Donavan Foil M.D.   On: 02/28/2018 00:50  ASSESSMENT AND PLAN:   [name] is an31 y.o.African-American femalewith multiple comorbidities including diabetes type 2, hypertension, hyperlipidemia, coronary disease status post CABG, history of stroke, CKD, was admitted on1/27/2020. [facility] [place] [facility] and achalasiaand 50 pound weight loss,12/2017. Admitted for dialysis.   1. Acute kidney injury on chronic kidney disease stage III. Uremia, likely causing altered mental status/acute metabolic encephalopathy - complicated by CHF, poor nutrition, cardio renal syndrome. Son and daughter were persistent and would like to give trial of dialysis to patient. They do understand the risk and complications involved in the process of dialysis. - Nephrology following - patient received dialysis treatment 02/27/2018, 02/28/2018 tolerated well. 03/01/2018 planned. Will continue to monitor patient's ability to tolerate due to her frail status and critical illness. Goal for daily dialysis 3-4 days total per Nephrology  - daily labs CBC, BMP - vascular access in place for HD  2.Acute on  chronic mild systolic, diastolic congestive heart failure with moderate MR and bilateral pleural effusion with and  Asarco and hypo albumin anemia, pending improvement with HD -BP meds held at family request for hypotension  3.Dysphagia secondary to achalasia. Patient failed Botox and status post peg tube feeding - patient has been having symptoms of vomiting. She has been getting tube feeding at 20 mL an hour. - recs for Tube feeding with Osmolite  - GI consulted for G tube replacement, per nursing note; "During assessment turned pt to the right and tube feeding connection to peg port came lose and unable to keep contact with peg due to pressure. Feedings placed on hold."  4.Type II diabetes -sliding scale insulin  5.History of left temporal and frontal punctate CVA -continue Plavix and Lipitor  6.Status post non-STEMI -continue Lipitor Plavix and Lopressor  7. Lymph node within umbilical hernia, son declined core biopsyaccording to [place] clinicalnotesGeneral [facility], no further recommendations at this time.---will need to readdress with son to r/o malignancy  8. Deconditioning, frailty - PT to evaluate and treatment ordered, as tolerated  9. Hypothermia Bear hugger as needed  All the records are reviewed and case is discussed with Care Management/Social Worker. Management plans discussed with the patient and/or family and they are in agreement.  CODE STATUS: Full Code  TOTAL TIME TAKING CARE OF THIS PATIENT: 30 minutes.   More than 50% of the time was spent in counseling/coordination of care: YES  POSSIBLE D/C IN 2-3 DAYS, DEPENDING ON CLINICAL CONDITION, status post dialysis.   Sarim Rothman PA-C on 03/01/2018 at 3:06 PM  Between 7am to 6pm - Pager - [phone]  After 6 pm go to [email or web address] - Proofreader  [facility]  Office  [phone]  CC: Primary care physician; Deland Pretty, MD

## 2018-03-01 NOTE — Progress Notes (Signed)
Pt has no obvious signs of distress.   03/01/18 1545  Vital Signs  Pulse Rate Source Monitor  BP Location Left Arm  BP Method Automatic  Patient Position (if appropriate) Lying  During Hemodialysis Assessment  Blood Flow Rate (mL/min) 300 mL/min  Arterial Pressure (mmHg) -60 mmHg  Venous Pressure (mmHg) 60 mmHg  Transmembrane Pressure (mmHg) 70 mmHg  Ultrafiltration Rate (mL/min) 670 mL/min  Dialysate Flow Rate (mL/min) 600 ml/min  Conductivity: Machine  14  HD Safety Checks Performed Yes  Intra-Hemodialysis Comments Progressing as prescribed

## 2018-03-01 NOTE — Progress Notes (Signed)
Pt has no signs of distress .   03/01/18 1800  Vital Signs  BP Location Left Arm  BP Method Automatic  Patient Position (if appropriate) Lying  During Hemodialysis Assessment  Blood Flow Rate (mL/min) 300 mL/min  Arterial Pressure (mmHg) -70 mmHg  Venous Pressure (mmHg) 60 mmHg  Transmembrane Pressure (mmHg) 70 mmHg  Ultrafiltration Rate (mL/min) 670 mL/min  Dialysate Flow Rate (mL/min) 600 ml/min  Conductivity: Machine  13.9  HD Safety Checks Performed Yes  Intra-Hemodialysis Comments Progressing as prescribed

## 2018-03-01 NOTE — Progress Notes (Signed)
Pt tolerated tx. Well, was discharged from acute room in stable condition   03/01/18 1845  Vital Signs  Pulse Rate Source Monitor  BP Location Right Arm  BP Method Automatic  Patient Position (if appropriate) Lying  During Hemodialysis Assessment  Blood Flow Rate (mL/min) 300 mL/min  Arterial Pressure (mmHg) -80 mmHg  Venous Pressure (mmHg) 60 mmHg  Transmembrane Pressure (mmHg) 70 mmHg  Ultrafiltration Rate (mL/min) 670 mL/min  Dialysate Flow Rate (mL/min) 600 ml/min  Conductivity: Machine  13.9  HD Safety Checks Performed Yes  KECN 156 KECN  Dialysis Fluid Bolus Normal Saline  Bolus Amount (mL) 250 mL  Intra-Hemodialysis Comments Tx completed;Tolerated well

## 2018-03-01 NOTE — Progress Notes (Signed)
During assessment turned pt to the right and tube feeding connection to peg port came lose and unable to keep contact with peg due to pressure. Feedings placed on hold. Pt does not seem to be in distress. Will notify MD and continue to assess.

## 2018-03-01 NOTE — Progress Notes (Signed)
PT Cancellation Note  Patient Details Name: [name] MRN: [id number] DOB: [birth year]   Cancelled Treatment:    Reason Eval/Treat Not Completed: Patient's level of consciousness Spoke with nursing that AM who yet again states that pt would ben unable/inappropriate to try to participate with PT given her mental status, etc.  This is the 3rd such day in a row with similar situation.  At this point we will complete PT orders.  Should pt become more alert, able/appropriate to participate she will need new PT orders.    Kreg Shropshire, DPT 03/01/2018, 10:52 AM

## 2018-03-01 NOTE — Progress Notes (Signed)
Pt unresponsive , vital signs WNL, tx started without problems.   03/01/18 1533  Vital Signs  Temp 97.6 F (36.4 C)  Temp Source Oral  Pulse Rate 80  Pulse Rate Source Monitor  Resp 15  BP 132/85  BP Location Left Arm  BP Method Automatic  Patient Position (if appropriate) Lying  Oxygen Therapy  SpO2 100 %  O2 Device Nasal Cannula  O2 Flow Rate (L/min) 2 L/min  PAINAD (Pain Assessment in Advanced Dementia)  Breathing 0  Negative Vocalization 0  Facial Expression 0  Body Language 0  Consolability 0  PAINAD Score 0  Time-Out for Hemodialysis  What Procedure? Dialysis  Pt Identifiers(min of two) First/Last Name;MRN/Account#  Correct Site? Yes  Correct Side? Yes  Correct Procedure? Yes  Consents Verified? Yes  Rad Studies Available? N/A  Safety Precautions Reviewed? Yes  During Hemodialysis Assessment  Blood Flow Rate (mL/min) 300 mL/min  Arterial Pressure (mmHg) -60 mmHg  Venous Pressure (mmHg) 60 mmHg  Transmembrane Pressure (mmHg) 70 mmHg  Ultrafiltration Rate (mL/min) 670 mL/min  Dialysate Flow Rate (mL/min) 600 ml/min  Conductivity: Machine  14  HD Safety Checks Performed Yes  Dialysis Fluid Bolus Normal Saline  Bolus Amount (mL) 250 mL  Dialysate Change  (Na 140)  Intra-Hemodialysis Comments Tx initiated  Hemodialysis Catheter Right Femoral vein  Placement Date: 02/26/18   Orientation: Right  Access Location: Femoral vein  Site Condition No complications

## 2018-03-01 NOTE — Progress Notes (Signed)
[place], [place] 03/01/18  Subjective:   Patient remains critically ill.  Not able to provide any meaningful information.  Few Moans. Very lethargic. Tolerated HD last 2 days 2 L of fluid removed so far Daughter at bedside.  States that she wakes up intermittently but no meaningful interaction.  Objective:  Vital signs in last 24 hours:  Temp:  [97.4 F (36.3 C)-98.5 F (36.9 C)] 98.2 F (36.8 C) (01/30 0656) Pulse Rate:  [74-84] 84 (01/30 0656) Resp:  [0-21] 18 (01/30 0656) BP: (96-131)/(40-65) 114/56 (01/30 0656) SpO2:  [100 %] 100 % (01/30 0656) Weight:  [82.5 kg-84.6 kg] 84.6 kg (01/30 0500)  Weight change: -3.5 kg Filed Weights   02/28/18 0928 02/28/18 1201 03/01/18 0500  Weight: 83.5 kg 82.5 kg 84.6 kg    Intake/Output:    Intake/Output Summary (Last 24 hours) at 03/01/2018 0950 Last data filed at 03/01/2018 0900 Gross per 24 hour  Intake 1235.67 ml  Output 1251 ml  Net -15.33 ml     Physical Exam: General:  Chronically ill-appearing, frail, elderly, in bed  HEENT  dry oral mucous membranes  Neck  distended neck veins  Pulm/lungs  Limited exam, mild basilar crackles, [place] O2 4 L  CVS/Heart  no rub, irregular  Abdomen:   Soft, nontender, nondistended  Extremities:  2-3+ pitting edema up to lower abdomen  Neurologic:  Hard of hearing, not able to answer any questions, lethargic  Skin:  No acute rashes  Access:  Right femoral dialysis catheter    Foley in place    Basic Metabolic Panel:  Recent Labs  Lab 02/24/18 0606 02/25/18 0500 02/26/18 0500 02/26/18 2040 02/27/18 0902 02/28/18 0501 03/01/18 0418  NA 128* 129* 127*  --  125* 130* 133*  K 4.2 4.3 4.4  --  4.6 3.8 3.7  CL 93* 93* 91*  --  93* 95* 98  CO2 22 21* 20*  --  21* 25 27  GLUCOSE 130* 144* 174*  --  126* 151* 165*  BUN 149* 150* 147*  --  152* 113* 93*  CREATININE 4.35* 4.48* 4.39* 4.36* 4.47* 3.77* 3.32*  CALCIUM 8.7* 8.5* 8.6*  --  8.3* 8.3* 8.0*   PHOS 5.2* 5.1* 4.8*  --   --  4.5 4.0     CBC: Recent Labs  Lab 02/26/18 2040 02/28/18 0501 03/01/18 0418  WBC 5.9 6.4 6.4  HGB 8.5* 7.3* 7.1*  HCT 27.5* 23.8* 23.4*  MCV 96.5 97.9 98.3  PLT 246 230 217      Lab Results  Component Value Date   HEPBSAG Negative 02/26/2018   HEPBSAB Non Reactive 02/26/2018   HEPBIGM Negative 02/26/2018      Microbiology:  Recent Results (from the past 240 hour(s))  MRSA PCR Screening     Status: Abnormal   Collection Time: 02/26/18  5:39 PM  Result Value Ref Range Status   MRSA by PCR POSITIVE (A) NEGATIVE Final    Comment:        The GeneXpert MRSA Assay (FDA approved for NASAL specimens only), is one component of a comprehensive MRSA colonization surveillance program. It is not intended to diagnose MRSA infection nor to guide or monitor treatment for MRSA infections. CRITICAL RESULT CALLED TO, READ BACK BY AND VERIFIED WITH:  CALLED TO DANA DUGGINS RN @1920  Performed at [facility], [address] [place], [place] [address]     Coagulation Studies: No results for input(s): LABPROT, INR in the last 72  hours.  Urinalysis: No results for input(s): COLORURINE, LABSPEC, PHURINE, GLUCOSEU, HGBUR, BILIRUBINUR, KETONESUR, PROTEINUR, UROBILINOGEN, NITRITE, LEUKOCYTESUR in the last 72 hours.  Invalid input(s): APPERANCEUR    Imaging: Dg Abd 1 View  Result Date: 02/28/2018 CLINICAL DATA:  Feeding tube placement EXAM: ABDOMEN - 1 VIEW COMPARISON:  None. FINDINGS: Right groin catheter tip to the right of L2. Feeding tube tip may be curled back upon itself over the central abdomen. Extensive vascular calcification. Nonobstructed gas pattern. IMPRESSION: No descending enteral tube is visualized. Presumed percutaneous feeding tube over the central abdomen with poorly visible tip which may be folded back upon itself. Electronically Signed   By: Donavan Foil M.D.   On: 02/28/2018 00:50     Medications:   . feeding  supplement (OSMOLITE 1.5 CAL) 1,000 mL (03/01/18 0920)   . atorvastatin  40 mg Per Tube q1800  . B-complex with vitamin C  1 tablet Per Tube Daily  . chlorhexidine  15 mL Mouth Rinse BID  . Chlorhexidine Gluconate Cloth  6 each Topical Q0600  . [START ON 03/02/2018] cholecalciferol  400 Units Per Tube QHS  . [START ON 03/02/2018] clopidogrel  75 mg Per Tube QHS  . collagenase   Topical Daily  . famotidine  10 mg Per Tube Daily  . feeding supplement (PRO-STAT SUGAR FREE 64)  30 mL Per Tube BID  . ferrous sulfate  325 mg Oral Q breakfast  . free water  20 mL Per Tube Q4H  . heparin  5,000 Units Subcutaneous Q8H  . insulin aspart  0-15 Units Subcutaneous Q6H  . mouth rinse  15 mL Mouth Rinse q12n4p  . [START ON 03/02/2018] multivitamin  15 mL Per Tube QHS  . mupirocin ointment  1 application Nasal BID  . pantoprazole sodium  40 mg Per Tube Daily   acetaminophen **OR** acetaminophen, albuterol, ipratropium, lip balm, Melatonin, nitroGLYCERIN, ondansetron, promethazine  Assessment/ Plan:  85 y.o. African-American female with multiple comorbidities including diabetes type 2, hypertension, hyperlipidemia, coronary disease status post CABG, history of stroke, CKD, was admitted on 02/26/2018. Originally admitted to [facility] with Achalasia and 50 pound weight loss, dysarthria  In NOV 2019  1.  Acute kidney injury on chronic kidney disease stage III.  Baseline creatinine probably 1.1 from nov 2019 2.  Malnutrition causing weight loss, now has G-tube 3.  Anasarca and pulmonary edema 4.  Chronic systolic congestive heart failure EF 30%.  CAD status post CABG 5.  Stroke with left-sided weakness 6.  Uremia, likely causing altered mental status 7.  Enlarging lymph node in umblical hernia   Patient is scheduled for 3rd dialysis treatment today in bed.  UF goal 1.5-2 kg. BUN level is lower.  No major improvement in mental status so far. Goal is to give daily dialysis for next few days to  improve uremia and remove volume as tolerated.    LOS: [place] 1/30/20209:50 AM  [place], [place]  Note: This note was prepared with Dragon dictation. Any transcription errors are unintentional

## 2018-03-01 NOTE — Progress Notes (Signed)
Pt has no signs of obvious sdistress   03/01/18 1645  Vital Signs  Pulse Rate Source Monitor  BP Location Left Arm  BP Method Automatic  Patient Position (if appropriate) Lying  During Hemodialysis Assessment  Blood Flow Rate (mL/min) 300 mL/min  Arterial Pressure (mmHg) -60 mmHg  Venous Pressure (mmHg) 60 mmHg  Transmembrane Pressure (mmHg) 70 mmHg  Ultrafiltration Rate (mL/min) 670 mL/min  Dialysate Flow Rate (mL/min) 600 ml/min  Conductivity: Machine  13.8  HD Safety Checks Performed Yes  Intra-Hemodialysis Comments Progressing as prescribed

## 2018-03-01 NOTE — Progress Notes (Signed)
Post dialysis assessment . CVC dressing changed.    03/01/18 1900  Neurological  Level of Consciousness Responds to Pain  Orientation Level Disoriented X4  RUE Motor Strength 2  LUE Motor Strength 2  Respiratory  Respiratory Pattern Regular;Unlabored  Chest Assessment Chest expansion symmetrical  Bilateral Breath Sounds Diminished  R Upper  Breath Sounds Diminished  L Upper Breath Sounds Diminished  R Lower Breath Sounds Fine crackles  L Lower Breath Sounds Fine crackles  Cough None  Cardiac  Pulse Regular  Heart Sounds S1, S2  Jugular Venous Distention (JVD) No  Cardiac Rhythm NSR  Vascular  R Radial Pulse +2  L Radial Pulse +2  R Dorsalis Pedis Pulse +1  L Dorsalis Pedis Pulse +1

## 2018-03-01 NOTE — Progress Notes (Signed)
Pre dialysis assessment    03/01/18 1530  Neurological  Level of Consciousness Responds to Pain  Orientation Level Disoriented X4  RUE Motor Strength 2  LUE Motor Strength 2  Respiratory  Respiratory Pattern Regular;Unlabored  Chest Assessment Chest expansion symmetrical  Bilateral Breath Sounds Diminished  R Upper  Breath Sounds Diminished  L Upper Breath Sounds Diminished  R Lower Breath Sounds Coarse crackles  L Lower Breath Sounds Fine crackles  Cough None  Cardiac  Pulse Regular  Heart Sounds S1, S2  Jugular Venous Distention (JVD) No  Cardiac Rhythm NSR  Vascular  R Radial Pulse +2  L Radial Pulse +2  R Dorsalis Pedis Pulse +1  L Dorsalis Pedis Pulse +1  Edema Generalized  Generalized Edema +3  RUE Edema +3  LUE Edema +3  RLE Edema +3  LLE Edema +3  Gastrointestinal  Bowel Sounds Assessment Active  GU Assessment  Genitourinary (WDL) X  Genitourinary Symptoms Urinary Catheter

## 2018-03-01 NOTE — Consult Note (Signed)
Darlene Lame, MD [facility]  [address], [place], [place] [address] Phone: [phone] Fax : [phone]  Consultation  Referring Provider:     Dr. Manuella Ghazi Primary Care Physician:  Deland Pretty, MD Primary Gastroenterologist:  Dr. Loletha Carrow         Reason for Consultation:     Malfunctioning PEG tube  Date of Admission:  02/26/2018 Date of Consultation:  03/01/2018         HPI:   [name] is a 85 y.o. female Who had a PEG tube placed in October 2019 for weight loss and dysphagia.  The patient was admitted with acute kidney injury complicated by CHF. The patient had encephalopathy on admission and has a history of strokes in the left cerebral hemisphere back in December 2019. I was called because the patient had difficulty feeding due to a malfunctioning feeding tube. A KUB of the abdomen showed the tip of the catheter may be folded back on itself but it could not be completely assessed.  Past Medical History:  Diagnosis Date  . Acute renal injury ([place]) 07/2016  . Anginal pain ([place]) 2004; 2011  . CAD (coronary artery disease)    status post stenting of the RCA status post CABG. (left internal mammary artery to  LAD, saphenous vein graft to second diagonal, saphenous vein  graft to obtuse marginal 1, saphenous vein graft to posterior  descending).  . Cardiomyopathy     Mildly reduced EF (45%).   . Chronic kidney disease (CKD), stage III (moderate) ([place])   . History of blood transfusion 1960s   "related to anemia"  . HTN (hypertension)   . Pleural effusion 2011   S/P "bypass"  . Shortness of breath   . Stroke ([place]) 09/19/2016   "left sided weakness" (09/20/2016)  . Type II diabetes mellitus ([place])     Past Surgical History:  Procedure Laterality Date  . BOTOX INJECTION  11/20/2017   Procedure: BOTOX INJECTION;  Surgeon: Doran Stabler, MD;  Location: [facility];  Service: Gastroenterology;;  . CATARACT EXTRACTION W/ INTRAOCULAR LENS  IMPLANT, BILATERAL Bilateral   .  CORONARY ANGIOPLASTY WITH STENT PLACEMENT  2004   "LAD & one of the circumflex"  . CORONARY ARTERY BYPASS GRAFT  2011   CABG X4  . ESOPHAGOGASTRODUODENOSCOPY (EGD) WITH PROPOFOL N/A 11/20/2017   Procedure: ESOPHAGOGASTRODUODENOSCOPY (EGD) WITH PROPOFOL;  Surgeon: Doran Stabler, MD;  Location: [facility];  Service: Gastroenterology;  Laterality: N/A;  . IR GASTROSTOMY TUBE MOD SED  12/10/2017    Prior to Admission medications   Medication Sig Start Date End Date Taking? Authorizing Provider  acetaminophen (TYLENOL) 160 MG/5ML solution Place 15.6 mLs (500 mg total) into feeding tube every 6 (six) hours as needed for mild pain or fever. 02/26/18  Yes Samuella Cota, MD  albuterol (PROVENTIL) (2.5 MG/3ML) 0.083% nebulizer solution Take 3 mLs (2.5 mg total) by nebulization every 4 (four) hours as needed for wheezing or shortness of breath. 02/26/18  Yes Samuella Cota, MD  amLODipine (NORVASC) 10 MG tablet Take 10 mg by mouth daily.   Yes [provider]  clopidogrel (PLAVIX) 75 MG tablet Place 1 tablet (75 mg total) into feeding tube daily. 02/27/18  Yes Samuella Cota, MD  glipiZIDE (GLUCOTROL XL) 10 MG 24 hr tablet Take 10 mg by mouth daily with breakfast.   Yes [provider]  isosorbide mononitrate (IMDUR) 30 MG 24 hr tablet Take 30 mg by mouth daily.  Yes [provider]  Melatonin 3 MG TABS Take 1 tablet (3 mg total) by mouth at bedtime as needed (insomnia). 02/26/18  Yes Samuella Cota, MD  metoprolol tartrate (LOPRESSOR) 50 MG tablet Take 50 mg by mouth 2 (two) times daily.   Yes [provider]  nitroGLYCERIN (NITROSTAT) 0.4 MG SL tablet Place 1 tablet (0.4 mg total) under the tongue every 5 (five) minutes x 3 doses as needed for chest pain. 02/26/18  Yes Samuella Cota, MD  ondansetron [facility]) 4 MG/2ML SOLN injection Inject 2 mLs (4 mg total) into the vein every 8 (eight) hours as needed for nausea or vomiting (nausea, vomiting).  02/26/18  Yes Samuella Cota, MD  atorvastatin (LIPITOR) 40 MG tablet Place 1 tablet (40 mg total) into feeding tube daily at 6 PM. 02/26/18   Samuella Cota, MD  chlorhexidine (PERIDEX) 0.12 % solution 15 mLs by Mouth Rinse route 2 (two) times daily. 02/26/18   Samuella Cota, MD  cholecalciferol (VITAMIN D3) 10 MCG (400 UNIT) TABS tablet Place 1 tablet (400 Units total) into feeding tube daily. 02/27/18   Samuella Cota, MD  collagenase (SANTYL) ointment Apply topically daily. 02/27/18   Samuella Cota, MD  famotidine (PEPCID) 10 MG tablet Place 1 tablet (10 mg total) into feeding tube daily. 02/27/18   Samuella Cota, MD  ferrous sulfate 300 (60 Fe) MG/5ML syrup Place 5 mLs (300 mg total) into feeding tube daily. 02/27/18   Samuella Cota, MD  ipratropium (ATROVENT) 0.02 % nebulizer solution Take 2.5 mLs (0.5 mg total) by nebulization every 6 (six) hours as needed for wheezing or shortness of breath. 02/26/18   Samuella Cota, MD  mouth rinse LIQD solution 15 mLs by Mouth Rinse route 2 times daily at 12 noon and 4 pm. 02/26/18   Samuella Cota, MD  Multiple Vitamin (MULTIVITAMIN) LIQD Place 15 mLs into feeding tube daily. 02/27/18   Samuella Cota, MD  Nutritional Supplements (FEEDING SUPPLEMENT, NEPRO CARB STEADY,) LIQD Place 1,000 mLs into feeding tube daily. 02/26/18   Samuella Cota, MD  patiromer (VELTASSA) 8.4 g packet Take 1 packet (8.4 g total) by mouth daily. 02/27/18   Samuella Cota, MD  promethazine (PHENERGAN) 25 MG/ML injection Inject 0.25 mLs (6.25 mg total) into the vein every 6 (six) hours as needed for nausea or vomiting. 02/26/18   Samuella Cota, MD  Water For Irrigation, Sterile (FREE WATER) SOLN Place 30 mLs into feeding tube every 8 (eight) hours. 02/26/18   Samuella Cota, MD    Family History  Problem Relation Age of Onset  . Diabetes Father 44  . Unexplained death Mother 61  . CAD Neg Hx      Social History   Tobacco Use    . Smoking status: Never Smoker  . Smokeless tobacco: Never Used  Substance Use Topics  . Alcohol use: No  . Drug use: No    Allergies as of 02/26/2018 - Review Complete 02/26/2018  Allergen Reaction Noted  . Contrast media [iodinated diagnostic agents]  11/04/2017    Review of Systems:    All systems reviewed and negative except where noted in HPI.   Physical Exam:  Vital signs in last 24 hours: Temp:  [97.6 F (36.4 C)-98.5 F (36.9 C)] 97.6 F (36.4 C) (01/30 1533) Pulse Rate:  [80-88] 81 (01/30 1615) Resp:  [15-20] 15 (01/30 1533) BP: (114-139)/(56-85) 136/64 (01/30 1615) SpO2:  [98 %-100 %]  100 % (01/30 1533) Weight:  [84.6 kg] 84.6 kg (01/30 0500) Last BM Date: 02/24/18 General:   Nonverbal in NAD Head:  Normocephalic and atraumatic. Eyes:   No icterus.   Conjunctiva pink. PERRLA. Ears:  Normal auditory acuity. Neck:  Supple; no masses or thyroidomegaly Lungs: Respirations even and unlabored. Lungs clear to auscultation bilaterally.   No wheezes, crackles, or rhonchi.  Heart:  Regular rate and rhythm;  Without murmur, clicks, rubs or gallops Abdomen:  Soft, nondistended, nontender. Normal bowel sounds. No appreciable masses or hepatomegaly.  No rebound or guarding.  PEG tube site appears normal Rectal:  Not performed. Msk:  Symmetrical without gross deformities.   Extremities:  Without edema, cyanosis or clubbing. Neurologic:  Alert Unable to assess neurological exam Skin:  Intact without significant lesions or rashes. Cervical Nodes:  No significant cervical adenopathy. Psych:  Alert   LAB RESULTS: Recent Labs    02/26/18 2040 02/28/18 0501 03/01/18 0418  WBC 5.9 6.4 6.4  HGB 8.5* 7.3* 7.1*  HCT 27.5* 23.8* 23.4*  PLT 246 230 217   BMET Recent Labs    02/27/18 0902 02/28/18 0501 03/01/18 0418  NA 125* 130* 133*  K 4.6 3.8 3.7  CL 93* 95* 98  CO2 21* 25 27  GLUCOSE 126* 151* 165*  BUN 152* 113* 93*  CREATININE 4.47* 3.77* 3.32*  CALCIUM 8.3*  8.3* 8.0*   LFT No results for input(s): PROT, ALBUMIN, AST, ALT, ALKPHOS, BILITOT, BILIDIR, IBILI in the last 72 hours. PT/INR No results for input(s): LABPROT, INR in the last 72 hours.  STUDIES: Dg Abd 1 View  Result Date: 02/28/2018 CLINICAL DATA:  Feeding tube placement EXAM: ABDOMEN - 1 VIEW COMPARISON:  None. FINDINGS: Right groin catheter tip to the right of L2. Feeding tube tip may be curled back upon itself over the central abdomen. Extensive vascular calcification. Nonobstructed gas pattern. IMPRESSION: No descending enteral tube is visualized. Presumed percutaneous feeding tube over the central abdomen with poorly visible tip which may be folded back upon itself. Electronically Signed   By: Donavan Foil M.D.   On: 02/28/2018 00:50      Impression / Plan:   Assessment: Principal Problem:   Acute renal failure (ARF) (HCC)   [name] is a 85 y.o. y/o female with a report of a malfunctioning PEG tube with imaging showing that the tip may be folded on itself.  By the time I came to see the patient the nurse had informed me that the problem was in the connector from the feeding tube to the tube feeding solution.  Since the connector was manipulated the feeding has gone without incident.  Plan: [name] feeding tube appears to be working fine at the present time and nothing further to do from a GI point of view.  The patient is presently receiving tube feeding without any complications.   I will sign off.  Please call if any further GI concerns or questions.  We would like to thank you for the opportunity to participate in the care of [name].    Thank you for involving me in the care of this patient.      LOS: 3 days   Darlene Lame, MD  03/01/2018, 4:21 PM    Note: This dictation was prepared with Dragon dictation along with smaller phrase technology. Any transcriptional errors that result from this process are unintentional.

## 2018-03-02 ENCOUNTER — Inpatient Hospital Stay: Payer: Medicare Other

## 2018-03-02 LAB — GLUCOSE, CAPILLARY
Glucose-Capillary: 152 mg/dL — ABNORMAL HIGH (ref 70–99)
Glucose-Capillary: 183 mg/dL — ABNORMAL HIGH (ref 70–99)
Glucose-Capillary: 193 mg/dL — ABNORMAL HIGH (ref 70–99)
Glucose-Capillary: 202 mg/dL — ABNORMAL HIGH (ref 70–99)
Glucose-Capillary: 232 mg/dL — ABNORMAL HIGH (ref 70–99)

## 2018-03-02 LAB — CBC
HCT: 25.9 % — ABNORMAL LOW (ref 36.0–46.0)
Hemoglobin: 7.9 g/dL — ABNORMAL LOW (ref 12.0–15.0)
MCH: 30.2 pg (ref 26.0–34.0)
MCHC: 30.5 g/dL (ref 30.0–36.0)
MCV: 98.9 fL (ref 80.0–100.0)
Platelets: 243 10*3/uL (ref 150–400)
RBC: 2.62 MIL/uL — AB (ref 3.87–5.11)
RDW: 16.2 % — ABNORMAL HIGH (ref 11.5–15.5)
WBC: 8.2 10*3/uL (ref 4.0–10.5)
nRBC: 0 % (ref 0.0–0.2)

## 2018-03-02 LAB — BASIC METABOLIC PANEL
Anion gap: 11 (ref 5–15)
BUN: 73 mg/dL — AB (ref 8–23)
CO2: 24 mmol/L (ref 22–32)
Calcium: 8 mg/dL — ABNORMAL LOW (ref 8.9–10.3)
Chloride: 98 mmol/L (ref 98–111)
Creatinine, Ser: 2.76 mg/dL — ABNORMAL HIGH (ref 0.44–1.00)
GFR calc Af Amer: 17 mL/min — ABNORMAL LOW (ref >60)
GFR calc non Af Amer: 15 mL/min — ABNORMAL LOW (ref >60)
Glucose, Bld: 256 mg/dL — ABNORMAL HIGH (ref 70–99)
POTASSIUM: 4 mmol/L (ref 3.5–5.1)
Sodium: 133 mmol/L — ABNORMAL LOW (ref 135–145)

## 2018-03-02 MED ORDER — METOPROLOL TARTRATE 50 MG PO TABS
50.0000 mg | ORAL_TABLET | Freq: Two times a day (BID) | ORAL | Status: DC
  Administered 2018-03-02 (×2): 50 mg via ORAL
  Filled 2018-03-02 (×3): qty 1

## 2018-03-02 MED ORDER — ACETAMINOPHEN 325 MG PO TABS
650.0000 mg | ORAL_TABLET | Freq: Four times a day (QID) | ORAL | Status: DC | PRN
  Administered 2018-03-11 – 2018-04-02 (×8): 650 mg via ORAL
  Filled 2018-03-02 (×11): qty 2

## 2018-03-02 MED ORDER — ACETAMINOPHEN 160 MG/5ML PO SOLN
650.0000 mg | Freq: Four times a day (QID) | ORAL | Status: DC | PRN
  Administered 2018-03-06 – 2018-03-28 (×13): 650 mg via ORAL
  Filled 2018-03-02 (×15): qty 20.3

## 2018-03-02 MED ORDER — CLOPIDOGREL BISULFATE 75 MG PO TABS
75.0000 mg | ORAL_TABLET | Freq: Every day | ORAL | Status: DC
  Administered 2018-03-03 – 2018-04-02 (×29): 75 mg
  Filled 2018-03-02 (×31): qty 1

## 2018-03-02 MED ORDER — ACETAMINOPHEN 160 MG/5ML PO SOLN
650.0000 mg | Freq: Four times a day (QID) | ORAL | Status: DC | PRN
  Filled 2018-03-02: qty 20.3

## 2018-03-02 MED ORDER — ACETAMINOPHEN 650 MG RE SUPP
650.0000 mg | Freq: Four times a day (QID) | RECTAL | Status: DC | PRN
  Filled 2018-03-02 (×2): qty 1

## 2018-03-02 NOTE — Progress Notes (Signed)
[place], [place] 03/02/18  Subjective:   Patient remains critically ill.  Not able to provide any meaningful information.  Seen during HD. Tolerating well.    HEMODIALYSIS FLOWSHEET:  Blood Flow Rate (mL/min): 300 mL/min Arterial Pressure (mmHg): -140 mmHg Venous Pressure (mmHg): 120 mmHg Transmembrane Pressure (mmHg): 70 mmHg Ultrafiltration Rate (mL/min): 960 mL/min Dialysate Flow Rate (mL/min): 600 ml/min Conductivity: Machine : 14.3 Conductivity: Machine : 14.3 Dialysis Fluid Bolus: Normal Saline Bolus Amount (mL): 250 mL Dialysate Change: (Na 140)   Objective:  Vital signs in last 24 hours:  Temp:  [97.4 F (36.3 C)-97.9 F (36.6 C)] 97.6 F (36.4 C) (01/31 1230) Pulse Rate:  [78-114] 95 (01/31 1230) Resp:  [13-25] 18 (01/31 1230) BP: (104-147)/(51-89) 117/58 (01/31 1230) SpO2:  [100 %] 100 % (01/31 1230) Weight:  [83.2 kg-85.5 kg] 83.2 kg (01/31 1230)  Weight change:  Filed Weights   03/01/18 0500 03/02/18 0918 03/02/18 1230  Weight: 84.6 kg 85.5 kg 83.2 kg    Intake/Output:    Intake/Output Summary (Last 24 hours) at 03/02/2018 1416 Last data filed at 03/02/2018 1230 Gross per 24 hour  Intake 386.67 ml  Output 2056 ml  Net -1669.33 ml     Physical Exam: General:  Chronically ill-appearing, frail, elderly, in bed  HEENT  dry oral mucous membranes  Neck  distended neck veins  Pulm/lungs  Limited exam, mild basilar crackles,  CVS/Heart  no rub, irregular  Abdomen:   Soft, nontender, nondistended  Extremities:  2-3+ pitting edema up to lower abdomen  Neurologic:  Hard of hearing, appears to be more alert than last few days  Skin:  No acute rashes  Access:  Right femoral dialysis catheter    Foley in place    Basic Metabolic Panel:  Recent Labs  Lab 02/24/18 0606 02/25/18 0500 02/26/18 0500 02/26/18 2040 02/27/18 0902 02/28/18 0501 03/01/18 0418 03/02/18 0617  NA 128* 129* 127*  --  125* 130* 133* 133*  K  4.2 4.3 4.4  --  4.6 3.8 3.7 4.0  CL 93* 93* 91*  --  93* 95* 98 98  CO2 22 21* 20*  --  21* 25 27 24   GLUCOSE 130* 144* 174*  --  126* 151* 165* 256*  BUN 149* 150* 147*  --  152* 113* 93* 73*  CREATININE 4.35* 4.48* 4.39* 4.36* 4.47* 3.77* 3.32* 2.76*  CALCIUM 8.7* 8.5* 8.6*  --  8.3* 8.3* 8.0* 8.0*  MG  --   --   --   --   --   --  2.2  --   PHOS 5.2* 5.1* 4.8*  --   --  4.5 4.0  --      CBC: Recent Labs  Lab 02/26/18 2040 02/28/18 0501 03/01/18 0418 03/02/18 0617  WBC 5.9 6.4 6.4 8.2  HGB 8.5* 7.3* 7.1* 7.9*  HCT 27.5* 23.8* 23.4* 25.9*  MCV 96.5 97.9 98.3 98.9  PLT 246 230 217 243      Lab Results  Component Value Date   HEPBSAG Negative 02/26/2018   HEPBSAB Non Reactive 02/26/2018   HEPBIGM Negative 02/26/2018      Microbiology:  Recent Results (from the past 240 hour(s))  MRSA PCR Screening     Status: Abnormal   Collection Time: 02/26/18  5:39 PM  Result Value Ref Range Status   MRSA by PCR POSITIVE (A) NEGATIVE Final    Comment:        The GeneXpert MRSA Assay (FDA  approved for NASAL specimens only), is one component of a comprehensive MRSA colonization surveillance program. It is not intended to diagnose MRSA infection nor to guide or monitor treatment for MRSA infections. CRITICAL RESULT CALLED TO, READ BACK BY AND VERIFIED WITH:  CALLED TO DANA DUGGINS RN @1920  Performed at [facility], [address] [place], [place] [address]     Coagulation Studies: No results for input(s): LABPROT, INR in the last 72 hours.  Urinalysis: No results for input(s): COLORURINE, LABSPEC, PHURINE, GLUCOSEU, HGBUR, BILIRUBINUR, KETONESUR, PROTEINUR, UROBILINOGEN, NITRITE, LEUKOCYTESUR in the last 72 hours.  Invalid input(s): APPERANCEUR    Imaging: No results found.   Medications:   . feeding supplement (OSMOLITE 1.5 CAL) 1,000 mL (03/02/18 1342)   . atorvastatin  40 mg Per Tube q1800  . B-complex with vitamin C  1 tablet Per Tube Daily   . chlorhexidine  15 mL Mouth Rinse BID  . Chlorhexidine Gluconate Cloth  6 each Topical Q0600  . cholecalciferol  400 Units Per Tube QHS  . clopidogrel  75 mg Per Tube QHS  . collagenase   Topical Daily  . famotidine  10 mg Per Tube Daily  . feeding supplement (PRO-STAT SUGAR FREE 64)  30 mL Per Tube BID  . ferrous sulfate  220 mg Oral Q breakfast  . free water  20 mL Per Tube Q4H  . heparin  5,000 Units Subcutaneous Q8H  . insulin aspart  0-15 Units Subcutaneous Q6H  . mouth rinse  15 mL Mouth Rinse q12n4p  . metoprolol tartrate  50 mg Oral BID  . multivitamin  15 mL Per Tube QHS  . mupirocin ointment  1 application Nasal BID  . nutrition supplement (JUVEN)  1 packet Oral BID BM  . pantoprazole sodium  40 mg Per Tube Daily  . polyethylene glycol  17 g Oral Daily   acetaminophen (TYLENOL) oral liquid 160 mg/5 mL **OR** acetaminophen **OR** acetaminophen, albuterol, ipratropium, lip balm, Melatonin, nitroGLYCERIN, ondansetron, promethazine  Assessment/ Plan:  85 y.o. African-American female with multiple comorbidities including diabetes type 2, hypertension, hyperlipidemia, coronary disease status post CABG, history of stroke, CKD, was admitted on 02/26/2018. Originally admitted to [facility] with Achalasia and 50 pound weight loss, dysarthria  In NOV 2019  1.  Acute kidney injury on chronic kidney disease stage III.  Baseline creatinine probably 1.1 from nov 2019 2.  Malnutrition causing weight loss, now has G-tube 3.  Anasarca and pulmonary edema 4.  Chronic systolic congestive heart failure EF 30%.  CAD status post CABG 5.  Stroke with left-sided weakness 6.  Uremia, likely causing altered mental status 7.  Enlarging lymph node in umblical hernia   Patient is undergoing daily dialysis to clear uremia.  Some improvement noted in mental status today.  So far, 4 L of fluid has been removed.  Urine output remains low. Plan for another dialysis treatment tomorrow Remove  fluid as tolerated    LOS: [place] 1/31/20202:16 PM  [place], [place]  Note: This note was prepared with Dragon dictation. Any transcription errors are unintentional

## 2018-03-02 NOTE — Progress Notes (Signed)
Pre HD Tx   03/02/18 0918  Vital Signs  Temp 97.6 F (36.4 C)  Temp Source Oral  Pulse Rate 96  Pulse Rate Source Monitor  Resp 19  BP 115/63  BP Location Left Arm  BP Method Automatic  Patient Position (if appropriate) Lying  Oxygen Therapy  SpO2 100 %  O2 Device Nasal Cannula  O2 Flow Rate (L/min) 2 L/min  Pain Assessment  Pain Scale Faces  Faces Pain Scale 0  Dialysis Weight  Weight 85.5 kg  Type of Weight Pre-Dialysis  Time-Out for Hemodialysis  What Procedure? HD  Pt Identifiers(min of two) First/Last Name;MRN/Account#  Correct Site? Yes  Correct Side? Yes  Correct Procedure? Yes  Consents Verified? Yes  Rad Studies Available? N/A  Safety Precautions Reviewed? Yes  Engineer, civil (consulting) Number 7  Station Number 1  UF/Alarm Test Passed  Conductivity: Meter 14  Conductivity: Machine  14.1  pH 7.4  Reverse Osmosis Main  Normal Saline Lot Number [id number]  Dialyzer Lot Number [id number]  Disposable Set Lot Number [id number]  Machine Temperature 98.6 F (37 C)  Musician and Audible Yes  Blood Lines Intact and Secured Yes  Pre Treatment Patient Checks  Vascular access used during treatment Catheter  Hepatitis B Surface Antigen Results Negative  Date Hepatitis B Surface Antigen Drawn 02/26/18  Hepatitis B Surface Antibody  (<10)  Date Hepatitis B Surface Antibody Drawn 02/26/18  Hemodialysis Consent Verified Yes  Hemodialysis Standing Orders Initiated Yes  ECG (Telemetry) Monitor On Yes  Prime Ordered Normal Saline  Length of  DialysisTreatment -hour(s) 3 Hour(s)  Dialysis Treatment Comments  (Na 140)  Dialyzer Elisio 17H NR  Dialysate 3K, 2.5 Ca  Dialysis Anticoagulant None  Dialysate Flow Ordered 600  Blood Flow Rate Ordered 300 mL/min  Ultrafiltration Goal 2 Liters  Dialysis Blood Pressure Support Ordered Normal Saline  Hemodialysis Catheter Right Femoral vein  Placement Date: 02/26/18   Orientation: Right  Access Location: Femoral vein  Site  Condition No complications  Blue Lumen Status Flushed  Red Lumen Status Flushed  Purple Lumen Status Capped (Central line)  Dressing Type Occlusive;Biopatch  Dressing Status Clean;Dry;Intact  Dressing Change Due 03/08/17

## 2018-03-02 NOTE — Progress Notes (Signed)
HD Tx completed tolerated well, UF goal met    03/02/18 1227  Vital Signs  Pulse Rate 93  Pulse Rate Source Monitor  Resp 18  BP 113/60  BP Location Left Arm  BP Method Automatic  Patient Position (if appropriate) Lying  Oxygen Therapy  SpO2 100 %  O2 Device Nasal Cannula  O2 Flow Rate (L/min) 2 L/min  Pulse Oximetry Type Continuous  During Hemodialysis Assessment  HD Safety Checks Performed Yes  KECN 47.7 KECN  Dialysis Fluid Bolus Normal Saline  Bolus Amount (mL) 250 mL  Intra-Hemodialysis Comments Tx completed;Tolerated well

## 2018-03-02 NOTE — Progress Notes (Signed)
[facility] [place] NAME: [name]    MR#:  [id number]  DATE OF BIRTH:  [birth year]  SUBJECTIVE:  CHIEF COMPLAINT:  No chief complaint on file. Here for dialysis.  Asleep when seen on rounds this AM, non-communicative but resisting passive motion of the extremities and moaning intermittently. Briefly opening eyes. Later after dialysis RN note states patient became verbal, asking for Pilar Plate, nodding.   Daughter at bedside requests restart metoprolol this AM and hold tube feedings for concerns of aspiration on emesis overnight when patient was "agitated". Requests liquid acetaminophen. Requests CXR for aspiration concern.   REVIEW OF SYSTEMS:  ROS unable to assess, patient non-communicative   DRUG ALLERGIES:   Allergies  Allergen Reactions  . Contrast Media [Iodinated Diagnostic Agents]     Sensitivity, kidney issues   VITALS:  Blood pressure (!) 117/58, pulse 95, temperature 97.6 F (36.4 C), temperature source Oral, resp. rate 18, weight 83.2 kg, SpO2 100 %. PHYSICAL EXAMINATION:  GENERAL:asleep with brief periods of waking, somnolent when roused. Not interactive but responds to voice. HEAD: Atraumatic, normocephalic. NECK: Supple.Distended neck veins. HEART: Regular rate and rhythm. No murmurs, no rubs, no clicks auscultated. LUNGS:Limited exam due to somnolence. Clear to auscultation anteriorly based on limited exam. ABDOMEN: Soft,nontender, anasarca present.Bowel sounds normoactive. EXTREMITIES: No evidence of any cyanosis, clubbing, +2 radial pulses bilaterally.3+ pitting edema bilaterally in upper and lower extremities. NEUROLOGIC:Hard of hearing, not able to answer any questions. SKIN:Warm with no rashes. ACCESS:Right femoral dialysis catheter AT:FTDDU catheter in place LABORATORY PANEL:  Female CBC Recent Labs  Lab 03/02/18 0617  WBC 8.2  HGB 7.9*  HCT 25.9*  PLT 243    ------------------------------------------------------------------------------------------------------------------ Chemistries  Recent Labs  Lab 03/01/18 0418 03/02/18 0617  NA 133* 133*  K 3.7 4.0  CL 98 98  CO2 27 24  GLUCOSE 165* 256*  BUN 93* 73*  CREATININE 3.32* 2.76*  CALCIUM 8.0* 8.0*  MG 2.2  --     Intake/Output Summary (Last 24 hours) at 03/02/2018 1438 Last data filed at 03/02/2018 1230 Gross per 24 hour  Intake 386.67 ml  Output 2056 ml  Net -1669.33 ml   Dialysis -  Net UF (mL) 2056 mL    RADIOLOGY:  Dg Chest 1 View  Result Date: 03/02/2018 CLINICAL DATA:  Aspiration.  Vomiting. EXAM: CHEST  1 VIEW COMPARISON:  12/17/2009. FINDINGS: Right PICC line noted with tip over upper superior vena cava. Prior CABG. Cardiomegaly with diffuse bilateral pulmonary infiltrates most consistent pulmonary edema. Bilateral pleural effusions. Findings consistent with CHF. IMPRESSION: 1.  Right PICC line noted with tip over upper superior vena cava. 2. Prior CABG. Congestive heart failure bilateral pulmonary edema and bilateral pleural effusions. Bilateral pneumonia can not be excluded. Electronically Signed   By: Marcello Moores  Register   On: 03/02/2018 14:26   Previous CXR reads: CLINICAL DATA:  85 year old female with vomiting. Query aspiration. Renal failure, status post NSTEMI last month, heart failure. Recent infections. Shortness of breath.  EXAM: PORTABLE CHEST 1 VIEW  COMPARISON:  0707 hours earlier today.  FINDINGS: Portable AP semi upright view at 1007 hours. Stable right PICC line.  Stable cardiomegaly and mediastinal contours. Stable veiling and confluent bibasilar opacity from earlier today, with some pleural fluid tracking in the right minor fissure. No new areas of pulmonary opacity. No pneumothorax. Stable upper lobe vascularity.  A proximal right humerus deformity is chronic and stable. Paucity of bowel gas in the upper abdomen.  IMPRESSION: Stable  from earlier today. Bilateral pleural effusions and lower lobe collapse or consolidation with no new cardiopulmonary abnormality.  Electronically Signed   By: Genevie Ann M.D.   On: 02/20/2018 10:46 ASSESSMENT AND PLAN:   [name] is an85 y.o.femalewith multiple comorbidities including diabetes type 2, hypertension, hyperlipidemia, coronary disease status post CABG, history of stroke, and CKD who was admitted on1/27/2020. [facility] [place] [facility] achal[facility]and 50 pound weight loss in 12/2017.Status post treatment for ESBL Klebsiella aspiration pneumonia, enterococcus UTIStatus post treatment for ESBL Klebsiella aspiration pneumonia, enterococcus UTI. Admitted for dialysis.  1. Acute kidney injury on chronic kidney disease stage III. Uremia, likely causing altered mental status/acute metabolic encephalopathy - complicated by CHF, poor nutrition, cardio renal syndrome.Son and daughter were persistent and would like to give trial of dialysis to patient. They do understand the risk and complications involved in the process of dialysis. - Nephrologyfollowing; patient receiveddialysis treatment 1/28, 1/29, 1/30, 1/31. Urine output low. Willcontinue tomonitor patient's ability to tolerate due to her frail status and critical illness - daily labs CBC, BMP - vascular accessin place for HD  2.Acute on chronic mild systolic, diastolic congestive heart failure with moderate MR and bilateral pleural effusion with and Asarca and hypoalbuminemia, pending improvement with HD - cardiac meds previously held at family request for hypotension, metoprolol restarted today at family request - family requested restarting isosorbide-hydralazine, patient not hypertensive on single agent at this time with low diastolic pressures  3.Dysphagia secondary to achalasia. Patient failed Botox and status post peg tube feeding - patient has been having symptoms of vomiting.  She has been getting tube feeding at 20 mL an hour. -recs for Tube feeding per nutrition, in place per GI c/s - daughter requested 03/02/2018 to stop all tube feeds for concern of aspiration on emesis    4.Type II diabetes -sliding scale insulin  5.History of left temporal and frontal punctate CVA -continue Plavix and Lipitor  6.Status post non-STEMI -continue Lipitor Plavix and Lopressor  7. Lymph node within umbilical hernia, son declined core biopsyaccording to [place] clinicalnotesGeneral [facility] no further recommendations at this time.---will need to readdress with son to r/o malignancy  8. Deconditioning, frailty  - PTtoevaluateand treatment ordered, as tolerated  9. Hypothermia Resolved. Bear hugger as needed  Additional: Family called RN, requests heparin to be held.  All the records are reviewed and case is discussed with Care Management/Social Worker. Management plans discussed with the patient and/or family and they are in agreement.  CODE STATUS: Full Code  TOTAL TIME TAKING CARE OF THIS PATIENT: 30 minutes.   More than 50% of the time was spent in counseling/coordination of care: YES  D/C  DEPENDING ON CLINICAL CONDITION.  Gaylynn Seiple PA-C on 03/02/2018 at 2:38 PM  Between 7am to 6pm - Pager - [phone]  After 6 pm go to [email or web address] - Proofreader  [facility]  Office  [phone]  CC: Primary care physician; Deland Pretty, MD

## 2018-03-02 NOTE — Progress Notes (Signed)
Pre HD assessment    03/02/18 0915  Neurological  Level of Consciousness Responds to Voice  Orientation Level Disoriented X4  Respiratory  Respiratory Pattern Regular;Unlabored  Chest Assessment Chest expansion symmetrical  Bilateral Breath Sounds Diminished  Cough None  Cardiac  Pulse Regular  Heart Sounds S1, S2  ECG Monitor Yes  Cardiac Rhythm ST  Vascular  R Radial Pulse +2  L Radial Pulse +2  Edema Generalized  Generalized Edema +2  Psychosocial  Psychosocial (WDL) X  Patient Behaviors Not interactive

## 2018-03-02 NOTE — Progress Notes (Signed)
Patient's daughter called stating " I know you are busy, but I just wanted to see how mom is doing and what medications did she receive today", nurse told daughter that the patient was doing well and more alert than she was prior to dialysis. Nurse then went over medications given to patient with daughter, daughter stopped nurse when nurse told her that the 'patient was given heparin and stated " she shouldn't be getting heparin that was discontinued because they give her heparin in dialysis and she bleeds". Nurse stated to daughter " the medication has not been discontinued so it was given and I will speak with the provider about this", daughter then asked " was she given the 27.5 something for blood pressure?" Nurse told daughter that she was only given the 50 mg of lopressor and that was all for blood pressure. Daughter then stated " no the doctor said he put it in and the PA said the order was in so can you check, nurse reiterated that the [facility] had been viewed thoroughly and that the 50 mg of lopressor was the only thing given. Nurse then told daughter that she will have the doctor contact her regarding medications, daughter then stated " hold on my brother would like to speak to you". [name] son got on the phone and asked if she had gotten the 50 mg lopressor and if it was ordered twice a day, nurse verified that this was correct and then he stated " she is not supposed to get heparin and it is all through out her medical chart because it causes vaginal bleeding, they had not been giving her that, so she should not have gotten it" nurse told the son that she would notify the provider and that the medication had not been discontinued. The son then asked nurse " so what happened with stopping her tube feed because she got sick and started vomiting you don't continue a tube feed if a patient is vomiting because that means she has a blockage or is retaining and she doesn't have a esophageal sphincter so that  means that her food can come back up easily, so it should have been stopped, don't you have a policy that states you do what family wants", nurse stated " I will have to verify this and I will contact the provider to let him know that you want the feed stopped. Son then stated " no there is such a policy and I want you to go in there and stop that tube feed and because it continued after we asked to be stopped you can place your self in a legal action, but any way I will be up there". Nurse then told patient's son that she was messaging Dr. Manuella Ghazi at the same time as speaking with him and that Dr. Manuella Ghazi gave the order to discontinue the heparin and to stop tube feed. Call was then ended. Nurse alerted Dr. Manuella Ghazi to what had happened when he arrived to the floor shortly after secure chat sent.

## 2018-03-02 NOTE — Progress Notes (Signed)
Post HD [facility]    03/02/18 1230  Hand-Off documentation  Report given to (Full Name) Lorrin Jackson, RN   Report received from (Full Name) Beatris Ship, RN   Vital Signs  Temp 97.6 F (36.4 C)  Temp Source Oral  Pulse Rate 95  Pulse Rate Source Monitor  Resp 18  BP (!) 117/58  BP Location Left Arm  BP Method Automatic  Patient Position (if appropriate) Lying  Oxygen Therapy  SpO2 100 %  O2 Device Nasal Cannula  O2 Flow Rate (L/min) 2 L/min  Pulse Oximetry Type Continuous  Pain Assessment  Pain Scale Faces  Pain Score 0  Dialysis Weight  Weight 83.2 kg  Type of Weight Post-Dialysis  Post-Hemodialysis Assessment  Rinseback Volume (mL) 250 mL  KECN 47.7 V  Dialyzer Clearance Lightly streaked  Duration of HD Treatment -hour(s) 3 hour(s)  Hemodialysis Intake (mL) 500 mL  UF Total -Machine (mL) 2556 mL  Net UF (mL) 2056 mL  Tolerated HD Treatment Yes  Hemodialysis Catheter Right Femoral vein  Placement Date: 02/26/18   Orientation: Right  Access Location: Femoral vein  Post treatment catheter status Capped and Clamped

## 2018-03-02 NOTE — Progress Notes (Signed)
HD Tx started w/o issue   03/02/18 0925  Vital Signs  BP 113/62  During Hemodialysis Assessment  Blood Flow Rate (mL/min) 300 mL/min  Arterial Pressure (mmHg) -160 mmHg  Venous Pressure (mmHg) 120 mmHg  Transmembrane Pressure (mmHg) 70 mmHg  Ultrafiltration Rate (mL/min) 960 mL/min  Dialysate Flow Rate (mL/min) 600 ml/min  Conductivity: Machine  14.3  HD Safety Checks Performed Yes  Dialysis Fluid Bolus Normal Saline  Bolus Amount (mL) 250 mL  Intra-Hemodialysis Comments Tx initiated  Hemodialysis Catheter Right Femoral vein  Placement Date: 02/26/18   Orientation: Right  Access Location: Femoral vein  Blue Lumen Status Infusing  Red Lumen Status Infusing

## 2018-03-02 NOTE — Progress Notes (Signed)
Post HD Assessment, pt became verbal toward end of [place], called for the nurse, called for Pilar Plate, nodded head yes, much more activity than previously.    03/02/18 1230  Neurological  Level of Consciousness Responds to Voice  Orientation Level Disoriented X4  Respiratory  Respiratory Pattern Regular;Unlabored  Chest Assessment Chest expansion symmetrical  Bilateral Breath Sounds Diminished;Clear  Cough Non-productive  Cardiac  Pulse Regular  Heart Sounds S1, S2  ECG Monitor Yes  Cardiac Rhythm ST  Vascular  R Radial Pulse +2  L Radial Pulse +2  Edema Generalized  Generalized Edema +1  Psychosocial  Psychosocial (WDL) WDL  Patient Behaviors Calm;Cooperative  Emotional support given Given to patient

## 2018-03-03 LAB — GLUCOSE, CAPILLARY
GLUCOSE-CAPILLARY: 110 mg/dL — AB (ref 70–99)
Glucose-Capillary: 135 mg/dL — ABNORMAL HIGH (ref 70–99)
Glucose-Capillary: 83 mg/dL (ref 70–99)
Glucose-Capillary: 87 mg/dL (ref 70–99)

## 2018-03-03 LAB — CBC
HCT: 26.2 % — ABNORMAL LOW (ref 36.0–46.0)
Hemoglobin: 7.9 g/dL — ABNORMAL LOW (ref 12.0–15.0)
MCH: 29.9 pg (ref 26.0–34.0)
MCHC: 30.2 g/dL (ref 30.0–36.0)
MCV: 99.2 fL (ref 80.0–100.0)
Platelets: 230 10*3/uL (ref 150–400)
RBC: 2.64 MIL/uL — ABNORMAL LOW (ref 3.87–5.11)
RDW: 16.5 % — AB (ref 11.5–15.5)
WBC: 7.3 10*3/uL (ref 4.0–10.5)
nRBC: 0.3 % — ABNORMAL HIGH (ref 0.0–0.2)

## 2018-03-03 LAB — BASIC METABOLIC PANEL
Anion gap: 9 (ref 5–15)
BUN: 55 mg/dL — ABNORMAL HIGH (ref 8–23)
CHLORIDE: 100 mmol/L (ref 98–111)
CO2: 25 mmol/L (ref 22–32)
Calcium: 8.3 mg/dL — ABNORMAL LOW (ref 8.9–10.3)
Creatinine, Ser: 2.17 mg/dL — ABNORMAL HIGH (ref 0.44–1.00)
GFR calc Af Amer: 23 mL/min — ABNORMAL LOW (ref >60)
GFR calc non Af Amer: 20 mL/min — ABNORMAL LOW (ref >60)
Glucose, Bld: 121 mg/dL — ABNORMAL HIGH (ref 70–99)
Potassium: 3.9 mmol/L (ref 3.5–5.1)
Sodium: 134 mmol/L — ABNORMAL LOW (ref 135–145)

## 2018-03-03 MED ORDER — OSMOLITE 1.5 CAL PO LIQD
1000.0000 mL | ORAL | Status: DC
  Administered 2018-03-03 – 2018-03-04 (×3): 1000 mL

## 2018-03-03 MED ORDER — PRO-STAT SUGAR FREE PO LIQD
30.0000 mL | Freq: Two times a day (BID) | ORAL | Status: DC
  Administered 2018-03-03 – 2018-04-03 (×56): 30 mL

## 2018-03-03 MED ORDER — METOPROLOL TARTRATE 25 MG PO TABS
25.0000 mg | ORAL_TABLET | Freq: Two times a day (BID) | ORAL | Status: DC
  Filled 2018-03-03: qty 1

## 2018-03-03 MED ORDER — ALBUMIN HUMAN 25 % IV SOLN
12.5000 g | Freq: Once | INTRAVENOUS | Status: DC
  Filled 2018-03-03: qty 50

## 2018-03-03 MED ORDER — METOPROLOL TARTRATE 25 MG PO TABS: 25.0000 mg | ORAL_TABLET | Freq: Two times a day (BID) | ORAL | Status: DC

## 2018-03-03 MED ORDER — JUVEN PO PACK
1.0000 | PACK | Freq: Two times a day (BID) | ORAL | Status: DC
  Administered 2018-03-04 – 2018-04-03 (×45): 1

## 2018-03-03 NOTE — Progress Notes (Signed)
Nutrition Brief Note   Tube feeds held r/t patient vomited once this morning after medication administration. Pt has had intermittent vomiting throughout her admit to this healthcare system. No further vomiting today. Will restart Osmolite 1.5 tube feeds @ 33ml/hr and plan to titrate up to goal rate of 24ml/hr over the next 24 hours. Pt on reglan while at [facility]; may need to consider adding reglan back to medication regimen.   Koleen Distance MS, RD, LDN Pager #- [phone] Office#- [phone] After Hours Pager: [phone]

## 2018-03-03 NOTE — Progress Notes (Signed)
[facility], [place] 03/03/18  Subjective:   Patient is little more interactive today.  Son is in the room.  She is able to respond to simple commands such as to state good morning or bye.  Continues to have large amount of lower extremity edema.  Objective:  Vital signs in last 24 hours:  Temp:  [97.6 F (36.4 C)-98 F (36.7 C)] 98 F (36.7 C) (02/01 0524) Pulse Rate:  [79-95] 79 (02/01 0916) Resp:  [16-25] 18 (02/01 0524) BP: (104-117)/(55-65) 109/64 (02/01 0916) SpO2:  [100 %] 100 % (02/01 0524) Weight:  [83.2 kg] 83.2 kg (01/31 1230)  Weight change:  Filed Weights   03/01/18 0500 03/02/18 0918 03/02/18 1230  Weight: 84.6 kg 85.5 kg 83.2 kg    Intake/Output:    Intake/Output Summary (Last 24 hours) at 03/03/2018 1118 Last data filed at 03/03/2018 0940 Gross per 24 hour  Intake 90 ml  Output 2076 ml  Net -1986 ml     Physical Exam: General:  Chronically ill-appearing, frail, elderly, in bed  HEENT  dry oral mucous membranes  Neck  distended neck veins  Pulm/lungs  Limited exam, mild basilar crackles,  CVS/Heart  no rub, irregular  Abdomen:   Soft, nontender, nondistended  Extremities:  2-3+ pitting edema up to lower abdomen  Neurologic:  Hard of hearing, more alert than last few days, followed few basic simple commands  Skin:  No acute rashes  Access:  Right femoral dialysis catheter    Foley in place    Basic Metabolic Panel:  Recent Labs  Lab 02/25/18 0500 02/26/18 0500  02/27/18 0902 02/28/18 0501 03/01/18 0418 03/02/18 0617 03/03/18 0548  NA 129* 127*  --  125* 130* 133* 133* 134*  K 4.3 4.4  --  4.6 3.8 3.7 4.0 3.9  CL 93* 91*  --  93* 95* 98 98 100  CO2 21* 20*  --  21* 25 27 24 25   GLUCOSE 144* 174*  --  126* 151* 165* 256* 121*  BUN 150* 147*  --  152* 113* 93* 73* 55*  CREATININE 4.48* 4.39*   < > 4.47* 3.77* 3.32* 2.76* 2.17*  CALCIUM 8.5* 8.6*  --  8.3* 8.3* 8.0* 8.0* 8.3*  MG  --   --   --   --   --  2.2  --    --   PHOS 5.1* 4.8*  --   --  4.5 4.0  --   --    < > = values in this interval not displayed.     CBC: Recent Labs  Lab 02/26/18 2040 02/28/18 0501 03/01/18 0418 03/02/18 0617 03/03/18 0548  WBC 5.9 6.4 6.4 8.2 7.3  HGB 8.5* 7.3* 7.1* 7.9* 7.9*  HCT 27.5* 23.8* 23.4* 25.9* 26.2*  MCV 96.5 97.9 98.3 98.9 99.2  PLT 246 230 217 243 230      Lab Results  Component Value Date   HEPBSAG Negative 02/26/2018   HEPBSAB Non Reactive 02/26/2018   HEPBIGM Negative 02/26/2018      Microbiology:  Recent Results (from the past 240 hour(s))  MRSA PCR Screening     Status: Abnormal   Collection Time: 02/26/18  5:39 PM  Result Value Ref Range Status   MRSA by PCR POSITIVE (A) NEGATIVE Final    Comment:        The GeneXpert MRSA Assay (FDA approved for NASAL specimens only), is one component of a comprehensive MRSA colonization surveillance program. It is  not intended to diagnose MRSA infection nor to guide or monitor treatment for MRSA infections. CRITICAL RESULT CALLED TO, READ BACK BY AND VERIFIED WITH:  CALLED TO DANA DUGGINS RN @1920  Performed at [facility], [address] [place], [place] [address]     Coagulation Studies: No results for input(s): LABPROT, INR in the last 72 hours.  Urinalysis: No results for input(s): COLORURINE, LABSPEC, PHURINE, GLUCOSEU, HGBUR, BILIRUBINUR, KETONESUR, PROTEINUR, UROBILINOGEN, NITRITE, LEUKOCYTESUR in the last 72 hours.  Invalid input(s): APPERANCEUR    Imaging: Dg Chest 1 View  Result Date: 03/02/2018 CLINICAL DATA:  Aspiration.  Vomiting. EXAM: CHEST  1 VIEW COMPARISON:  12/17/2009. FINDINGS: Right PICC line noted with tip over upper superior vena cava. Prior CABG. Cardiomegaly with diffuse bilateral pulmonary infiltrates most consistent pulmonary edema. Bilateral pleural effusions. Findings consistent with CHF. IMPRESSION: 1.  Right PICC line noted with tip over upper superior vena cava. 2. Prior CABG.  Congestive heart failure bilateral pulmonary edema and bilateral pleural effusions. Bilateral pneumonia can not be excluded. Electronically Signed   By: Marcello Moores  Register   On: 03/02/2018 14:26     Medications:   . albumin human     . atorvastatin  40 mg Per Tube q1800  . B-complex with vitamin C  1 tablet Per Tube Daily  . chlorhexidine  15 mL Mouth Rinse BID  . Chlorhexidine Gluconate Cloth  6 each Topical Q0600  . cholecalciferol  400 Units Per Tube QHS  . clopidogrel  75 mg Per Tube QHS  . collagenase   Topical Daily  . famotidine  10 mg Per Tube Daily  . ferrous sulfate  220 mg Oral Q breakfast  . free water  20 mL Per Tube Q4H  . insulin aspart  0-15 Units Subcutaneous Q6H  . mouth rinse  15 mL Mouth Rinse q12n4p  . multivitamin  15 mL Per Tube QHS  . mupirocin ointment  1 application Nasal BID  . pantoprazole sodium  40 mg Per Tube Daily  . polyethylene glycol  17 g Oral Daily   acetaminophen (TYLENOL) oral liquid 160 mg/5 mL **OR** acetaminophen **OR** acetaminophen, albuterol, ipratropium, lip balm, Melatonin, nitroGLYCERIN, ondansetron, promethazine  Assessment/ Plan:  85 y.o. African-American female with multiple comorbidities including diabetes type 2, hypertension, hyperlipidemia, coronary disease status post CABG, history of stroke, CKD, was admitted on 02/26/2018. Originally admitted to [name] [place] with Achalasia and 50 pound weight loss, dysarthria  In NOV 2019  1.  Acute kidney injury on chronic kidney disease stage III.  Baseline creatinine probably 1.1 from nov 2019 2.  Malnutrition causing weight loss, now has G-tube 3.  Anasarca and pulmonary edema 4.  Chronic systolic congestive heart failure EF 30%.  CAD status post CABG 5.  Stroke with left-sided weakness 6.  Uremia, likely causing altered mental status 7.  Enlarging lymph node in umblical hernia   Patient is undergoing daily dialysis to clear uremia.  Some improvement noted in mental status today.   So far, about 6 L of fluid has been removed.  Urine output remains low.  Discussed with son.  [place] for another dialysis treatment today.  Consider PermCath placement Monday or Tuesday. Fluid removal with hemodialysis as tolerated.    LOS: [place] 2/1/202011:18 [place], [place]  Note: This note was prepared with Dragon dictation. Any transcription errors are unintentional

## 2018-03-03 NOTE — Progress Notes (Signed)
Post Hd Tx    03/03/18 1347  Hand-Off documentation  Report given to (Full Name) Lincoln Maxin, RN  Report received from (Full Name) Beatris Ship, Rn   Vital Signs  Temp 97.6 F (36.4 C)  Temp Source Oral  Pulse Rate 80  Pulse Rate Source Monitor  Resp 20  BP (!) 106/59  BP Location Left Arm  BP Method Automatic  Patient Position (if appropriate) Lying  Oxygen Therapy  SpO2 100 %  O2 Device Nasal Cannula  O2 Flow Rate (L/min) 3 L/min  Post-Hemodialysis Assessment  Rinseback Volume (mL) 250 mL  KECN 62.8 V  Dialyzer Clearance Lightly streaked  Duration of HD Treatment -hour(s) 3.5 hour(s)  Hemodialysis Intake (mL) 500 mL  UF Total -Machine (mL) 1513 mL  Net UF (mL) 1013 mL  Tolerated HD Treatment Yes  Hemodialysis Catheter Right Femoral vein  Placement Date: 02/26/18   Orientation: Right  Access Location: Femoral vein  Site Condition No complications  Blue Lumen Status Heparin locked  Red Lumen Status Heparin locked  Post treatment catheter status Capped and Clamped

## 2018-03-03 NOTE — Progress Notes (Signed)
Pre HD Tx    03/03/18 1028  Vital Signs  Temp 98 F (36.7 C)  Temp Source Oral  Pulse Rate 79  Pulse Rate Source Monitor  Resp 16  BP 93/71  BP Location Left Arm  BP Method Automatic  Patient Position (if appropriate) Lying  Oxygen Therapy  SpO2 100 %  O2 Device Nasal Cannula  O2 Flow Rate (L/min) 3 L/min  Pulse Oximetry Type Continuous  Pain Assessment  Pain Scale 0-10  Pain Score 0  Dialysis Weight  Weight 83.7 kg  Type of Weight Pre-Dialysis  Time-Out for Hemodialysis  What Procedure? HD   Pt Identifiers(min of two) First/Last Name;MRN/Account#  Correct Site? Yes  Correct Side? Yes  Correct Procedure? Yes  Consents Verified? Yes  Rad Studies Available? Yes  Safety Precautions Reviewed? Yes  Pre Treatment Patient Checks  Vascular access used during treatment Catheter  Hepatitis B Surface Antigen Results Negative  Date Hepatitis B Surface Antigen Drawn 02/26/18  Hepatitis B Surface Antibody  (<10)  Date Hepatitis B Surface Antibody Drawn 02/26/18  Hemodialysis Consent Verified Yes  Hemodialysis Standing Orders Initiated Yes  ECG (Telemetry) Monitor On Yes  Prime Ordered Normal Saline  Length of  DialysisTreatment -hour(s) 3.5 Hour(s)  Dialysis Treatment Comments Na 140  Dialyzer Elisio 17H NR  Dialysate 3K, 2.5 Ca  Dialysis Anticoagulant None  Dialysate Flow Ordered 600  Blood Flow Rate Ordered 300 mL/min  Ultrafiltration Goal 1 Liters  Dialysis Blood Pressure Support Ordered Normal Saline  Hemodialysis Catheter Right Femoral vein  Placement Date: 02/26/18   Orientation: Right  Access Location: Femoral vein  Site Condition No complications  Blue Lumen Status Flushed  Red Lumen Status Flushed  Dressing Type Biopatch;Gauze/Drain sponge  Dressing Status Clean;Dry;Intact

## 2018-03-03 NOTE — Progress Notes (Signed)
HD Tx started w/o complication    47/82/95 1030  Vital Signs  Pulse Rate 79  Pulse Rate Source Monitor  Resp (!) 4  BP 96/63  BP Location Left Arm  BP Method Automatic  Patient Position (if appropriate) Lying  Oxygen Therapy  SpO2 100 %  O2 Device Nasal Cannula  O2 Flow Rate (L/min) 3 L/min  Pulse Oximetry Type Continuous  Machine Checks  Machine Number 7  Station Number 1  UF/Alarm Test Passed  Conductivity: Meter  (14.0)  Conductivity: Machine  14.2  pH 7.4  Reverse Osmosis Main   Normal Saline Lot Number [id number]  Dialyzer Lot Number [id number]  Disposable Set Lot Number [id number]  Machine Temperature 98.6 F (37 C)  Musician and Audible Yes  Blood Lines Intact and Secured Yes  During Hemodialysis Assessment  Blood Flow Rate (mL/min) 300 mL/min  Arterial Pressure (mmHg) -100 mmHg  Venous Pressure (mmHg) 90 mmHg  Transmembrane Pressure (mmHg) 70 mmHg  Ultrafiltration Rate (mL/min) 500 mL/min  Dialysate Flow Rate (mL/min) 600 ml/min  Conductivity: Machine  14.2  HD Safety Checks Performed Yes  Dialysis Fluid Bolus Normal Saline  Bolus Amount (mL) 250 mL  Intra-Hemodialysis Comments Tx initiated  Hemodialysis Catheter Right Femoral vein  Placement Date: 02/26/18   Orientation: Right  Access Location: Femoral vein  Site Condition No complications  Blue Lumen Status Infusing  Red Lumen Status Infusing

## 2018-03-03 NOTE — Progress Notes (Signed)
[facility] [place] NAME: Tenesha Garza    MR#:  [id number]  DATE OF BIRTH:  Apr 10, 1927  SUBJECTIVE:  CHIEF COMPLAINT:  No chief complaint on file. Here for dialysis.  Seen during the dialysis today.  She was opening eyes to stimuli.  Does not appear in any distress.  REVIEW OF SYSTEMS:  ROS unable to assess, patient non-communicative   DRUG ALLERGIES:   Allergies  Allergen Reactions  . Contrast Media [Iodinated Diagnostic Agents]     Sensitivity, kidney issues   VITALS:  Blood pressure (!) 107/59, pulse 81, temperature (!) 97.4 F (36.3 C), temperature source Axillary, resp. rate 16, weight 83.7 kg, SpO2 100 %. PHYSICAL EXAMINATION:  GENERAL:asleep with brief periods of waking, somnolent when roused. Not interactive but responds to voice. HEAD: Atraumatic, normocephalic. NECK: Supple.Distended neck veins. HEART: Regular rate and rhythm. No murmurs, no rubs, no clicks auscultated. LUNGS:Limited exam due to somnolence. Clear to auscultation anteriorly based on limited exam. ABDOMEN: Soft,nontender, Bowel sounds normoactive. EXTREMITIES: No evidence of any cyanosis, clubbing, +2radial pulses bilaterally.2+ pitting edema bilaterally in upper and lower extremities. NEUROLOGIC: Sleepy but opens eyes to stimuli ,not able to answer any questions. SKIN:Warm with no rashes. ACCESS:Right femoral dialysis catheter QA:STMHD catheter in place LABORATORY PANEL:  Female CBC Recent Labs  Lab 03/03/18 0548  WBC 7.3  HGB 7.9*  HCT 26.2*  PLT 230   ------------------------------------------------------------------------------------------------------------------ Chemistries  Recent Labs  Lab 03/01/18 0418  03/03/18 0548  NA 133*   < > 134*  K 3.7   < > 3.9  CL 98   < > 100  CO2 27   < > 25  GLUCOSE 165*   < > 121*  BUN 93*   < > 55*  CREATININE 3.32*   < > 2.17*  CALCIUM 8.0*   < > 8.3*  MG 2.2  --   --    < > = values  in this interval not displayed.    Intake/Output Summary (Last 24 hours) at 03/03/2018 1417 Last data filed at 03/03/2018 0940 Gross per 24 hour  Intake 90 ml  Output 20 ml  Net 70 ml   Dialysis -  Net UF (mL) 2056 mL    RADIOLOGY:  No results found. Previous CXR reads: CLINICAL DATA:  85 year old female with vomiting. Query aspiration. Renal failure, status post NSTEMI last month, heart failure. Recent infections. Shortness of breath.  EXAM: PORTABLE CHEST 1 VIEW  COMPARISON:  0707 hours earlier today.  FINDINGS: Portable AP semi upright view at 1007 hours. Stable right PICC line.  Stable cardiomegaly and mediastinal contours. Stable veiling and confluent bibasilar opacity from earlier today, with some pleural fluid tracking in the right minor fissure. No new areas of pulmonary opacity. No pneumothorax. Stable upper lobe vascularity.  A proximal right humerus deformity is chronic and stable. Paucity of bowel gas in the upper abdomen.  IMPRESSION: Stable from earlier today. Bilateral pleural effusions and lower lobe collapse or consolidation with no new cardiopulmonary abnormality.  Electronically Signed   By: Genevie Ann M.D.   On: 02/20/2018 10:46 ASSESSMENT AND PLAN:   Noreene Boreman is an54 y.o.femalewith multiple comorbidities including diabetes type 2, hypertension, hyperlipidemia, coronary disease status post CABG, history of stroke, and CKD who was admitted on1/27/2020. [facility] [place] [facility] and achalasiaand 50 pound weight loss in 12/2017.Status post treatment for ESBL Klebsiella aspiration pneumonia, enterococcus UTIStatus post treatment for ESBL Klebsiella aspiration pneumonia,  enterococcus UTI. Admitted for dialysis.  1. Acute kidney injury on chronic kidney disease stage III. Uremia, likely causing altered mental status/acute metabolic encephalopathy - complicated by CHF, poor nutrition, cardio renal  syndrome.Son and daughter were persistent and would like to give trial of dialysis to patient. They do understand the risk and complications involved in the process of dialysis. - Nephrologyfollowing; patient receiveddialysis treatment 1/28, 1/29, 1/30, 1/31, 2/1. Urine output low. Willcontinue tomonitor patient's ability to tolerate due to her frail status and critical illness - daily labs CBC, BMP - vascular accessin place for HD -Dr. Candiss Norse has also given albumin injection during dialysis 03/02/18  2.Acute on chronic mild systolic, diastolic congestive heart failure with moderate MR and bilateral pleural effusion with and Asarca and hypoalbuminemia, pending improvement with HD - cardiac meds previously held at family request for hypotension, metoprolol restarted at family request -Holding isosorbide-hydralazine, patient not hypertensive on single agent at this time with low diastolic pressures -Due to lower blood pressure in dialysis today, I discussed with son and decreased metoprolol to 5 mg twice daily.  3.Dysphagia secondary to achalasia. Patient failed Botox and status post peg tube feeding - patient has been having symptoms of vomiting. She has been getting tube feeding at 20 mL an hour. -recs for Tube feeding per nutrition, in place per GI c/s - daughter requested 03/02/2018 to stop all tube feeds for concern of aspiration on emesis    4.Type II diabetes -sliding scale insulin  5.History of left temporal and frontal punctate CVA -continue Plavix and Lipitor  6.Status post non-STEMI -continue Lipitor Plavix and Lopressor  7. Lymph node within umbilical hernia, son declined core biopsyaccording to [place] clinicalnotesGeneral surgery [facility] no further recommendations at this time.---will need to readdress with son to r/o malignancy  8. Deconditioning, frailty  - PTtoevaluateand treatment ordered, as tolerated  9. Hypothermia Resolved.  Bear hugger as needed  Additional: Family called RN, requests heparin to be held.  All the records are reviewed and case is discussed with Care Management/Social Worker. Management plans discussed with the patient and/or family and they are in agreement.  CODE STATUS: Full Code  TOTAL TIME TAKING CARE OF THIS PATIENT: 30 minutes.   More than 50% of the time was spent in counseling/coordination of care: YES  D/C  DEPENDING ON CLINICAL CONDITION.  Vaughan Basta PA-C on 03/03/2018 at 2:17 PM  Between 7am to 6pm - Pager - [phone]  After 6 pm go to [email or web address] - Proofreader  [facility]  Office  [phone]  CC: Primary care physician; Deland Pretty, MD

## 2018-03-03 NOTE — Progress Notes (Signed)
Pre Hd Assessment    03/03/18 1020  Neurological  Level of Consciousness Alert  Orientation Level Disoriented X4  Respiratory  Respiratory Pattern Regular  Chest Assessment Chest expansion symmetrical  Bilateral Breath Sounds Clear;Diminished  Cough None  Cardiac  Pulse Regular  Heart Sounds S1, S2  ECG Monitor Yes  Cardiac Rhythm NSR  Vascular  R Radial Pulse +1  L Radial Pulse +1  Edema Generalized  Generalized Edema +2  Psychosocial  Psychosocial (WDL) WDL

## 2018-03-03 NOTE — Progress Notes (Signed)
Per Dr Anselm Jungling and patient family, tube feeds were to be restarted once patient returned to the floor from HD. There is currently no active order for feeding supplement. Notified Dr Anselm Jungling and paged dietary and called dietary office twice. No response so far. Tube feeds have not be restarted.

## 2018-03-03 NOTE — Progress Notes (Signed)
HD Tx completed    03/03/18 1345  Vital Signs  Pulse Rate 80  Pulse Rate Source Monitor  Resp 14  BP (!) 101/58  BP Location Left Arm  BP Method Automatic  Patient Position (if appropriate) Lying  Oxygen Therapy  SpO2 100 %  O2 Device Nasal Cannula  O2 Flow Rate (L/min) 3 L/min  During Hemodialysis Assessment  HD Safety Checks Performed Yes  KECN 62.8 KECN  Dialysis Fluid Bolus Normal Saline  Bolus Amount (mL) 250 mL  Intra-Hemodialysis Comments Tx completed;Tolerated well

## 2018-03-03 NOTE — Progress Notes (Signed)
Post HD Assessment    03/03/18 1355  Neurological  Level of Consciousness Alert  Orientation Level Disoriented X4  Respiratory  Respiratory Pattern Regular  Chest Assessment Chest expansion symmetrical  Bilateral Breath Sounds Clear;Diminished  Cough None  Cardiac  Pulse Regular  Heart Sounds S1, S2  ECG Monitor Yes  Cardiac Rhythm NSR  Vascular  R Radial Pulse +1  L Radial Pulse +1  Edema Generalized  Generalized Edema +2  Psychosocial  Psychosocial (WDL) WDL

## 2018-03-04 LAB — QUANTIFERON-TB GOLD PLUS (RQFGPL)
QUANTIFERON MITOGEN VALUE: 0.03 [IU]/mL
QuantiFERON Nil Value: 0.02 IU/mL
QuantiFERON TB1 Ag Value: 0.03 IU/mL
QuantiFERON TB2 Ag Value: 0.02 IU/mL

## 2018-03-04 LAB — COMPREHENSIVE METABOLIC PANEL
ALT: 134 U/L — ABNORMAL HIGH (ref 0–44)
AST: 118 U/L — ABNORMAL HIGH (ref 15–41)
Albumin: 2 g/dL — ABNORMAL LOW (ref 3.5–5.0)
Alkaline Phosphatase: 152 U/L — ABNORMAL HIGH (ref 38–126)
Anion gap: 6 (ref 5–15)
BUN: 46 mg/dL — ABNORMAL HIGH (ref 8–23)
CO2: 30 mmol/L (ref 22–32)
Calcium: 8.6 mg/dL — ABNORMAL LOW (ref 8.9–10.3)
Chloride: 101 mmol/L (ref 98–111)
Creatinine, Ser: 1.94 mg/dL — ABNORMAL HIGH (ref 0.44–1.00)
GFR calc non Af Amer: 23 mL/min — ABNORMAL LOW (ref >60)
GFR, EST AFRICAN AMERICAN: 26 mL/min — AB (ref >60)
Glucose, Bld: 171 mg/dL — ABNORMAL HIGH (ref 70–99)
Potassium: 3.8 mmol/L (ref 3.5–5.1)
Sodium: 137 mmol/L (ref 135–145)
Total Bilirubin: 0.9 mg/dL (ref 0.3–1.2)
Total Protein: 6.3 g/dL — ABNORMAL LOW (ref 6.5–8.1)

## 2018-03-04 LAB — CBC
HEMATOCRIT: 25.6 % — AB (ref 36.0–46.0)
Hemoglobin: 7.7 g/dL — ABNORMAL LOW (ref 12.0–15.0)
MCH: 30.1 pg (ref 26.0–34.0)
MCHC: 30.1 g/dL (ref 30.0–36.0)
MCV: 100 fL (ref 80.0–100.0)
Platelets: 227 10*3/uL (ref 150–400)
RBC: 2.56 MIL/uL — ABNORMAL LOW (ref 3.87–5.11)
RDW: 16.5 % — ABNORMAL HIGH (ref 11.5–15.5)
WBC: 8.3 10*3/uL (ref 4.0–10.5)
nRBC: 0 % (ref 0.0–0.2)

## 2018-03-04 LAB — GLUCOSE, CAPILLARY
GLUCOSE-CAPILLARY: 165 mg/dL — AB (ref 70–99)
Glucose-Capillary: 136 mg/dL — ABNORMAL HIGH (ref 70–99)
Glucose-Capillary: 141 mg/dL — ABNORMAL HIGH (ref 70–99)
Glucose-Capillary: 152 mg/dL — ABNORMAL HIGH (ref 70–99)

## 2018-03-04 LAB — QUANTIFERON-TB GOLD PLUS: QuantiFERON-TB Gold Plus: UNDETERMINED

## 2018-03-04 MED ORDER — METOPROLOL TARTRATE 25 MG PO TABS
12.5000 mg | ORAL_TABLET | Freq: Two times a day (BID) | ORAL | Status: DC
  Administered 2018-03-04 (×2): 12.5 mg
  Filled 2018-03-04 (×5): qty 1

## 2018-03-04 MED ORDER — ALBUMIN HUMAN 25 % IV SOLN
12.5000 g | Freq: Once | INTRAVENOUS | Status: AC
  Administered 2018-03-05: 12.5 g via INTRAVENOUS
  Filled 2018-03-04: qty 50

## 2018-03-04 NOTE — Progress Notes (Signed)
[name] at [place] NAME: [name]    MR#:  [id number]  DATE OF BIRTH:  09/23/1927  SUBJECTIVE:  CHIEF COMPLAINT:  No chief complaint on file. Here for dialysis.  More alert today.  Answering simple questions, following simple commands.  REVIEW OF SYSTEMS:  ROS unable to assess, due to acuity of medical condition.  DRUG ALLERGIES:   Allergies  Allergen Reactions  . Contrast Media [Iodinated Diagnostic Agents]     Sensitivity, kidney issues   VITALS:  Blood pressure 132/64, pulse 96, temperature 97.9 F (36.6 C), temperature source Axillary, resp. rate 18, weight 83.7 kg, SpO2 100 %. PHYSICAL EXAMINATION:  GENERAL:Laying in bed, not in any acute distress, talk and responds to voice. HEAD: Atraumatic, normocephalic. NECK: Supple. No distention on neck veins. HEART: Regular rate and rhythm. No murmurs, no rubs, no clicks auscultated. LUNGS: Clear to auscultation anteriorly based on limited exam. ABDOMEN: Soft,nontender, Bowel sounds normoactive. EXTREMITIES: No evidence of any cyanosis, clubbing, +2radial pulses bilaterally.2+ pitting edema bilaterally in upper and lower extremities. NEUROLOGIC: More awake today, talks in simple sentences and follow simple commands.  Able to move right upper extremity but left upper extremity is not moving, power 0 out of 5 in left upper extremity and both lower extremity SKIN:Warm with no rashes. ACCESS:Right femoral dialysis catheter  LABORATORY PANEL:  Female CBC Recent Labs  Lab 03/04/18 0812  WBC 8.3  HGB 7.7*  HCT 25.6*  PLT 227   ------------------------------------------------------------------------------------------------------------------ Chemistries  Recent Labs  Lab 03/01/18 0418  03/04/18 0812  NA 133*   < > 137  K 3.7   < > 3.8  CL 98   < > 101  CO2 27   < > 30  GLUCOSE 165*   < > 171*  BUN 93*   < > 46*  CREATININE 3.32*   < > 1.94*  CALCIUM 8.0*   < >  8.6*  MG 2.2  --   --   AST  --   --  118*  ALT  --   --  134*  ALKPHOS  --   --  152*  BILITOT  --   --  0.9   < > = values in this interval not displayed.    Intake/Output Summary (Last 24 hours) at 03/04/2018 1309 Last data filed at 03/04/2018 1247 Gross per 24 hour  Intake 129 ml  Output 1013 ml  Net -884 ml   Dialysis -  Net UF (mL) 2056 mL    RADIOLOGY:  No results found. Previous CXR reads: CLINICAL DATA:  85 year old female with vomiting. Query aspiration. Renal failure, status post NSTEMI last month, heart failure. Recent infections. Shortness of breath.  EXAM: PORTABLE CHEST 1 VIEW  COMPARISON:  0707 hours earlier today.  FINDINGS: Portable AP semi upright view at 1007 hours. Stable right PICC line.  Stable cardiomegaly and mediastinal contours. Stable veiling and confluent bibasilar opacity from earlier today, with some pleural fluid tracking in the right minor fissure. No new areas of pulmonary opacity. No pneumothorax. Stable upper lobe vascularity.  A proximal right humerus deformity is chronic and stable. Paucity of bowel gas in the upper abdomen.  IMPRESSION: Stable from earlier today. Bilateral pleural effusions and lower lobe collapse or consolidation with no new cardiopulmonary abnormality.  Electronically Signed   By: Genevie Ann M.D.   On: 02/20/2018 10:46 ASSESSMENT AND PLAN:   [name] is an85 y.o.femalewith multiple comorbidities including diabetes  type 2, hypertension, hyperlipidemia, coronary disease status post CABG, history of stroke, and CKD who was admitted on1/27/2020. [facility] [place] [facility]withdysphagia and achalasiaand 50 pound weight loss in 12/2017.Status post treatment for ESBL Klebsiella aspiration pneumonia, enterococcus UTIStatus post treatment for ESBL Klebsiella aspiration pneumonia, enterococcus UTI. Admitted for dialysis.  1. Acute kidney injury on chronic kidney disease stage  III. Uremia, likely causing altered mental status/acute metabolic encephalopathy - complicated by CHF, poor nutrition, cardio renal syndrome.Son and daughter were persistent and would like to give trial of dialysis to patient. They do understand the risk and complications involved in the process of dialysis. - Nephrologyfollowing; patient receiveddialysis treatment 1/28, 1/29, 1/30, 1/31, 2/1. Urine output low. Willcontinue tomonitor patient's ability to tolerate due to her frail status and critical illness - daily labs CBC, BMP - vascular accessin place for HD -Dr. Candiss Norse has also given albumin injection during dialysis 03/02/18  2.Acute on chronic mild systolic, diastolic congestive heart failure with moderate MR and bilateral pleural effusion with and Asarca and hypoalbuminemia, pending improvement with HD - cardiac meds previously held at family request for hypotension, metoprolol restarted at family request -Holding isosorbide-hydralazine, patient not hypertensive on single agent at this time with low diastolic pressures -Due to lower blood pressure in dialysis today, I discussed with son and decreased metoprolol to 12.5 mg twice daily.  3.Dysphagia secondary to achalasia. Patient failed Botox and status post peg tube feeding - patient has been having symptoms of vomiting. She has been getting tube feeding at 20 mL an hour. -recs for Tube feeding per nutrition, in place per GI c/s - daughter requested 03/02/2018 to stop all tube feeds for concern of aspiration on emesis , but resume now as patient is improving.   4.Type II diabetes -sliding scale insulin  5.History of left temporal and frontal punctate CVA -continue Plavix and Lipitor  6.Status post non-STEMI -continue Lipitor Plavix and Lopressor  7. Lymph node within umbilical hernia, son declined core biopsyaccording to [place] clinicalnotesGeneral surgery [facility] evaluated [facility], no further recommendations at  this time.---will need to readdress with son to r/o malignancy  8. Deconditioning, frailty  - PTtoevaluateand treatment ordered, as tolerated  9. Hypothermia Resolved. Bear hugger as needed  Additional: Family had requests heparin to be held.  All the records are reviewed and case is discussed with Care Management/Social Worker. Management plans discussed with the patient and/or family and they are in agreement.  CODE STATUS: Full Code  TOTAL TIME TAKING CARE OF THIS PATIENT: 30 minutes.   More than 50% of the time was spent in counseling/coordination of care: YES  D/C  DEPENDING ON CLINICAL CONDITION.  Vaughan Basta PA-C on 03/04/2018 at 1:09 PM  Between 7am to 6pm - Pager - [phone]  After 6 pm go to [email or web address] - Proofreader  [facility]  Office  [phone]  CC: Primary care physician; Deland Pretty, MD

## 2018-03-04 NOTE — Progress Notes (Signed)
[facility], [place] 03/04/18  Subjective:   Patient is little more interactive today.  Hollers loudly intermittently. She is able to respond to simple commands.  Continues to have large amount of lower extremity edema.  Objective:  Vital signs in last 24 hours:  Temp:  [97.4 F (36.3 C)-99 F (37.2 C)] 97.9 F (36.6 C) (02/02 1154) Pulse Rate:  [80-96] 96 (02/02 1154) Resp:  [14-20] 18 (02/02 1154) BP: (96-132)/(48-69) 132/64 (02/02 1154) SpO2:  [100 %] 100 % (02/02 1154)  Weight change: -1.8 kg Filed Weights   03/02/18 0918 03/02/18 1230 03/03/18 1028  Weight: 85.5 kg 83.2 kg 83.7 kg    Intake/Output:    Intake/Output Summary (Last 24 hours) at 03/04/2018 1330 Last data filed at 03/04/2018 1247 Gross per 24 hour  Intake 129 ml  Output 1013 ml  Net -884 ml     Physical Exam: General:  Chronically ill-appearing, frail, elderly, in bed  HEENT  dry oral mucous membranes  Neck  distended neck veins  Pulm/lungs  Limited exam, mild basilar crackles,  CVS/Heart  no rub, irregular  Abdomen:   Soft, nontender, nondistended  Extremities:  2+ pitting edema up to lower abdomen, improving slowly  Neurologic:  Hard of hearing, more alert than last few days, followed few basic simple commands  Skin:  No acute rashes  Access:  Right femoral dialysis catheter       Basic Metabolic Panel:  Recent Labs  Lab 02/26/18 0500  02/28/18 0501 03/01/18 0418 03/02/18 0617 03/03/18 0548 03/04/18 0812  NA 127*   < > 130* 133* 133* 134* 137  K 4.4   < > 3.8 3.7 4.0 3.9 3.8  CL 91*   < > 95* 98 98 100 101  CO2 20*   < > 25 27 24 25 30   GLUCOSE 174*   < > 151* 165* 256* 121* 171*  BUN 147*   < > 113* 93* 73* 55* 46*  CREATININE 4.39*   < > 3.77* 3.32* 2.76* 2.17* 1.94*  CALCIUM 8.6*   < > 8.3* 8.0* 8.0* 8.3* 8.6*  MG  --   --   --  2.2  --   --   --   PHOS 4.8*  --  4.5 4.0  --   --   --    < > = values in this interval not displayed.     CBC: Recent  Labs  Lab 02/28/18 0501 03/01/18 0418 03/02/18 0617 03/03/18 0548 03/04/18 0812  WBC 6.4 6.4 8.2 7.3 8.3  HGB 7.3* 7.1* 7.9* 7.9* 7.7*  HCT 23.8* 23.4* 25.9* 26.2* 25.6*  MCV 97.9 98.3 98.9 99.2 100.0  PLT 230 217 243 230 227      Lab Results  Component Value Date   HEPBSAG Negative 02/26/2018   HEPBSAB Non Reactive 02/26/2018   HEPBIGM Negative 02/26/2018      Microbiology:  Recent Results (from the past 240 hour(s))  MRSA PCR Screening     Status: Abnormal   Collection Time: 02/26/18  5:39 PM  Result Value Ref Range Status   MRSA by PCR POSITIVE (A) NEGATIVE Final    Comment:        The GeneXpert MRSA Assay (FDA approved for NASAL specimens only), is one component of a comprehensive MRSA colonization surveillance program. It is not intended to diagnose MRSA infection nor to guide or monitor treatment for MRSA infections. CRITICAL RESULT CALLED TO, READ BACK BY AND VERIFIED WITH:  CALLED TO Apolinar Junes RN @1920  Performed at [facility], [address] [place], [place] [address]     Coagulation Studies: No results for input(s): LABPROT, INR in the last 72 hours.  Urinalysis: No results for input(s): COLORURINE, LABSPEC, PHURINE, GLUCOSEU, HGBUR, BILIRUBINUR, KETONESUR, PROTEINUR, UROBILINOGEN, NITRITE, LEUKOCYTESUR in the last 72 hours.  Invalid input(s): APPERANCEUR    Imaging: Dg Chest 1 View  Result Date: 03/02/2018 CLINICAL DATA:  Aspiration.  Vomiting. EXAM: CHEST  1 VIEW COMPARISON:  12/17/2009. FINDINGS: Right PICC line noted with tip over upper superior vena cava. Prior CABG. Cardiomegaly with diffuse bilateral pulmonary infiltrates most consistent pulmonary edema. Bilateral pleural effusions. Findings consistent with CHF. IMPRESSION: 1.  Right PICC line noted with tip over upper superior vena cava. 2. Prior CABG. Congestive heart failure bilateral pulmonary edema and bilateral pleural effusions. Bilateral pneumonia can not be excluded.  Electronically Signed   By: Marcello Moores  Register   On: 03/02/2018 14:26     Medications:   . albumin human Stopped (03/03/18 1340)  . feeding supplement (OSMOLITE 1.5 CAL) 1,000 mL (03/04/18 1248)   . atorvastatin  40 mg Per Tube q1800  . B-complex with vitamin C  1 tablet Per Tube Daily  . chlorhexidine  15 mL Mouth Rinse BID  . Chlorhexidine Gluconate Cloth  6 each Topical Q0600  . cholecalciferol  400 Units Per Tube QHS  . clopidogrel  75 mg Per Tube QHS  . collagenase   Topical Daily  . famotidine  10 mg Per Tube Daily  . feeding supplement (PRO-STAT SUGAR FREE 64)  30 mL Per Tube BID  . ferrous sulfate  220 mg Oral Q breakfast  . free water  20 mL Per Tube Q4H  . insulin aspart  0-15 Units Subcutaneous Q6H  . mouth rinse  15 mL Mouth Rinse q12n4p  . metoprolol tartrate  12.5 mg Per Tube BID  . multivitamin  15 mL Per Tube QHS  . mupirocin ointment  1 application Nasal BID  . nutrition supplement (JUVEN)  1 packet Per Tube BID BM  . pantoprazole sodium  40 mg Per Tube Daily  . polyethylene glycol  17 g Oral Daily   acetaminophen (TYLENOL) oral liquid 160 mg/5 mL **OR** acetaminophen **OR** acetaminophen, albuterol, ipratropium, lip balm, Melatonin, nitroGLYCERIN, ondansetron, promethazine  Assessment/ Plan:  85 y.o. African-American female with multiple comorbidities including diabetes type 2, hypertension, hyperlipidemia, coronary disease status post CABG, history of stroke, CKD, was admitted on 02/26/2018. Originally admitted to [facility] [place] with Achalasia and 50 pound weight loss, dysarthria  In NOV 2019  1.  Acute kidney injury on chronic kidney disease stage III.  Baseline creatinine probably 1.1 from nov 2019 2.  Malnutrition causing weight loss, now has G-tube 3.  Anasarca and pulmonary edema 4.  Chronic systolic congestive heart failure EF 30%.  CAD status post CABG 5.  Stroke with left-sided weakness 6.  Uremia, likely causing altered mental status 7.   Enlarging lymph node in umblical hernia   Patient is undergoing daily dialysis to clear uremia.  Some improvement noted in mental status today.  So far, about 6-7 L of fluid has been removed.  Urine output remains low.  Plan for dialysis on Monday. So far has been receiving daily dialysis.  Consider PermCath placement Monday or Tuesday. Fluid removal with hemodialysis as tolerated.    LOS: [place] 2/2/20201:30 PM  [place], [place]  Note: This note was prepared  with Sales executive. Any transcription errors are unintentional

## 2018-03-05 ENCOUNTER — Encounter
Admission: RE | Disposition: A | Discharge status: Home Health | Payer: Self-pay | Source: Ambulatory Visit | Attending: Internal Medicine

## 2018-03-05 DIAGNOSIS — N186 End stage renal disease: Secondary | ICD-10-CM

## 2018-03-05 HISTORY — PX: DIALYSIS/PERMA CATHETER INSERTION: CATH118288

## 2018-03-05 LAB — BASIC METABOLIC PANEL
Anion gap: 8 (ref 5–15)
BUN: 66 mg/dL — ABNORMAL HIGH (ref 8–23)
CO2: 29 mmol/L (ref 22–32)
Calcium: 9.1 mg/dL (ref 8.9–10.3)
Chloride: 103 mmol/L (ref 98–111)
Creatinine, Ser: 2.38 mg/dL — ABNORMAL HIGH (ref 0.44–1.00)
GFR calc Af Amer: 20 mL/min — ABNORMAL LOW (ref >60)
GFR calc non Af Amer: 18 mL/min — ABNORMAL LOW (ref >60)
Glucose, Bld: 214 mg/dL — ABNORMAL HIGH (ref 70–99)
Potassium: 4.3 mmol/L (ref 3.5–5.1)
SODIUM: 140 mmol/L (ref 135–145)

## 2018-03-05 LAB — GLUCOSE, CAPILLARY
GLUCOSE-CAPILLARY: 172 mg/dL — AB (ref 70–99)
GLUCOSE-CAPILLARY: 188 mg/dL — AB (ref 70–99)
GLUCOSE-CAPILLARY: 198 mg/dL — AB (ref 70–99)
Glucose-Capillary: 111 mg/dL — ABNORMAL HIGH (ref 70–99)
Glucose-Capillary: 98 mg/dL (ref 70–99)
Glucose-Capillary: 93 mg/dL (ref 70–99)

## 2018-03-05 LAB — CBC
HCT: 25.9 % — ABNORMAL LOW (ref 36.0–46.0)
Hemoglobin: 7.7 g/dL — ABNORMAL LOW (ref 12.0–15.0)
MCH: 29.6 pg (ref 26.0–34.0)
MCHC: 29.7 g/dL — ABNORMAL LOW (ref 30.0–36.0)
MCV: 99.6 fL (ref 80.0–100.0)
Platelets: 222 10*3/uL (ref 150–400)
RBC: 2.6 MIL/uL — ABNORMAL LOW (ref 3.87–5.11)
RDW: 16.5 % — ABNORMAL HIGH (ref 11.5–15.5)
WBC: 9.6 10*3/uL (ref 4.0–10.5)
nRBC: 0.4 % — ABNORMAL HIGH (ref 0.0–0.2)

## 2018-03-05 SURGERY — DIALYSIS/PERMA CATHETER INSERTION
Anesthesia: Moderate Sedation

## 2018-03-05 MED ORDER — SODIUM CHLORIDE 0.9 % IV SOLN: INTRAVENOUS | Status: DC

## 2018-03-05 MED ORDER — MIDAZOLAM HCL 5 MG/5ML IJ SOLN
INTRAMUSCULAR | Status: AC
  Filled 2018-03-05: qty 5

## 2018-03-05 MED ORDER — OSMOLITE 1.5 CAL PO LIQD
1000.0000 mL | ORAL | Status: DC
  Administered 2018-03-05 – 2018-03-07 (×2): 1000 mL

## 2018-03-05 MED ORDER — HEPARIN (PORCINE) IN NACL 1000-0.9 UT/500ML-% IV SOLN
INTRAVENOUS | Status: AC
  Filled 2018-03-05: qty 500

## 2018-03-05 MED ORDER — LIDOCAINE-EPINEPHRINE (PF) 1 %-1:200000 IJ SOLN
INTRAMUSCULAR | Status: AC
  Filled 2018-03-05: qty 30

## 2018-03-05 MED ORDER — FENTANYL CITRATE (PF) 100 MCG/2ML IJ SOLN
INTRAMUSCULAR | Status: AC
  Filled 2018-03-05: qty 2

## 2018-03-05 MED ORDER — CEFAZOLIN SODIUM-DEXTROSE 2-4 GM/100ML-% IV SOLN
INTRAVENOUS | Status: AC
  Administered 2018-03-05: 1 g via INTRAVENOUS
  Filled 2018-03-05: qty 100

## 2018-03-05 MED ORDER — HEPARIN SODIUM (PORCINE) 10000 UNIT/ML IJ SOLN
INTRAMUSCULAR | Status: AC
  Filled 2018-03-05: qty 1

## 2018-03-05 MED ORDER — FENTANYL CITRATE (PF) 100 MCG/2ML IJ SOLN
INTRAMUSCULAR | Status: DC | PRN
  Administered 2018-03-05: 25 ug via INTRAVENOUS

## 2018-03-05 MED ORDER — CEFAZOLIN SODIUM-DEXTROSE 2-4 GM/100ML-% IV SOLN
2.0000 g | INTRAVENOUS | Status: DC
  Administered 2018-03-05: 1 g via INTRAVENOUS
  Filled 2018-03-05: qty 100

## 2018-03-05 MED ORDER — MIDAZOLAM HCL 2 MG/2ML IJ SOLN
INTRAMUSCULAR | Status: DC | PRN
  Administered 2018-03-05 (×2): 1 mg via INTRAVENOUS

## 2018-03-05 MED ORDER — CEFAZOLIN SODIUM-DEXTROSE 1-4 GM/50ML-% IV SOLN: 1.0000 g | Freq: Once | INTRAVENOUS | Status: DC

## 2018-03-05 SURGICAL SUPPLY — 8 items
ADH SKN CLS APL DERMABOND .7 (GAUZE/BANDAGES/DRESSINGS) ×1
CATH PALINDROME RT-P 15FX19CM (CATHETERS) ×2 IMPLANT
DERMABOND ADVANCED (GAUZE/BANDAGES/DRESSINGS) ×2
DERMABOND ADVANCED .7 DNX12 (GAUZE/BANDAGES/DRESSINGS) IMPLANT
PACK ANGIOGRAPHY (CUSTOM PROCEDURE TRAY) ×2 IMPLANT
SUT PROLENE 0 CT 1 30 (SUTURE) ×2 IMPLANT
SUT VIC AB 3-0 SH 27 (SUTURE) ×3
SUT VIC AB 3-0 SH 27X BRD (SUTURE) IMPLANT

## 2018-03-05 NOTE — Progress Notes (Addendum)
Pt to [facility] for permcath placement. Noted approx 10cm exposed RUA PICC catheter length. Notified Dr Lucky Cowboy. IV fluids not connected at this time pending xray verification. MD to verify placement before permcath procedure.

## 2018-03-05 NOTE — Progress Notes (Signed)
[place], [place] 03/05/18  Subjective:  Patient seen and evaluated during dialysis treatment. She has been undergoing daily hemodialysis. Tolerating well. For PermCath placement later today.   Objective:  Vital signs in last 24 hours:  Temp:  [97.7 F (36.5 C)-98.7 F (37.1 C)] 98.7 F (37.1 C) (02/03 0820) Pulse Rate:  [77-101] 82 (02/03 1015) Resp:  [0-24] 22 (02/03 1015) BP: (104-132)/(61-73) 127/61 (02/03 1015) SpO2:  [99 %-100 %] 100 % (02/03 1015) Weight:  [81.8 kg] 81.8 kg (02/03 0820)  Weight change:  Filed Weights   03/02/18 1230 03/03/18 1028 03/05/18 0820  Weight: 83.2 kg 83.7 kg 81.8 kg    Intake/Output:    Intake/Output Summary (Last 24 hours) at 03/05/2018 1031 Last data filed at 03/04/2018 1247 Gross per 24 hour  Intake 44 ml  Output -  Net 44 ml     Physical Exam: General:  Chronically ill-appearing  HEENT  dry oral mucous membranes  Neck  distended neck veins  Pulm/lungs  Scattered rhonchi, normal effort  CVS/Heart  no rub, irregular  Abdomen:   Soft, nontender, nondistended  Extremities:  2-3+ pitting edema up to lower abdomen  Neurologic:  Hard of hearing, but arousable  Skin:  No acute rashes  Access:  Right femoral dialysis catheter  GU:  Foley in place    Basic Metabolic Panel:  Recent Labs  Lab 02/28/18 0501 03/01/18 0418 03/02/18 0617 03/03/18 0548 03/04/18 0812 03/05/18 0445  NA 130* 133* 133* 134* 137 140  K 3.8 3.7 4.0 3.9 3.8 4.3  CL 95* 98 98 100 101 103  CO2 25 27 24 25 30 29   GLUCOSE 151* 165* 256* 121* 171* 214*  BUN 113* 93* 73* 55* 46* 66*  CREATININE 3.77* 3.32* 2.76* 2.17* 1.94* 2.38*  CALCIUM 8.3* 8.0* 8.0* 8.3* 8.6* 9.1  MG  --  2.2  --   --   --   --   PHOS 4.5 4.0  --   --   --   --      CBC: Recent Labs  Lab 03/01/18 0418 03/02/18 0617 03/03/18 0548 03/04/18 0812 03/05/18 0445  WBC 6.4 8.2 7.3 8.3 9.6  HGB 7.1* 7.9* 7.9* 7.7* 7.7*  HCT 23.4* 25.9* 26.2* 25.6*  25.9*  MCV 98.3 98.9 99.2 100.0 99.6  PLT 217 243 230 227 222      Lab Results  Component Value Date   HEPBSAG Negative 02/26/2018   HEPBSAB Non Reactive 02/26/2018   HEPBIGM Negative 02/26/2018      Microbiology:  Recent Results (from the past 240 hour(s))  MRSA PCR Screening     Status: Abnormal   Collection Time: 02/26/18  5:39 PM  Result Value Ref Range Status   MRSA by PCR POSITIVE (A) NEGATIVE Final    Comment:        The GeneXpert MRSA Assay (FDA approved for NASAL specimens only), is one component of a comprehensive MRSA colonization surveillance program. It is not intended to diagnose MRSA infection nor to guide or monitor treatment for MRSA infections. CRITICAL RESULT CALLED TO, READ BACK BY AND VERIFIED WITH:  CALLED TO DANA DUGGINS RN @1920  Performed at [facility], [address] [place], [place] [address]     Coagulation Studies: No results for input(s): LABPROT, INR in the last 72 hours.  Urinalysis: No results for input(s): COLORURINE, LABSPEC, PHURINE, GLUCOSEU, HGBUR, BILIRUBINUR, KETONESUR, PROTEINUR, UROBILINOGEN, NITRITE, LEUKOCYTESUR in the last 72 hours.  Invalid input(s): APPERANCEUR  Imaging: No results found.   Medications:   . albumin human Stopped (03/03/18 1340)  . feeding supplement (OSMOLITE 1.5 CAL) Stopped (03/05/18 0015)   . atorvastatin  40 mg Per Tube q1800  . B-complex with vitamin C  1 tablet Per Tube Daily  . chlorhexidine  15 mL Mouth Rinse BID  . cholecalciferol  400 Units Per Tube QHS  . clopidogrel  75 mg Per Tube QHS  . collagenase   Topical Daily  . famotidine  10 mg Per Tube Daily  . feeding supplement (PRO-STAT SUGAR FREE 64)  30 mL Per Tube BID  . ferrous sulfate  220 mg Oral Q breakfast  . free water  20 mL Per Tube Q4H  . insulin aspart  0-15 Units Subcutaneous Q6H  . mouth rinse  15 mL Mouth Rinse q12n4p  . metoprolol tartrate  12.5 mg Per Tube BID  . multivitamin  15 mL Per Tube QHS   . nutrition supplement (JUVEN)  1 packet Per Tube BID BM  . pantoprazole sodium  40 mg Per Tube Daily  . polyethylene glycol  17 g Oral Daily   acetaminophen (TYLENOL) oral liquid 160 mg/5 mL **OR** acetaminophen **OR** acetaminophen, albuterol, ipratropium, lip balm, Melatonin, nitroGLYCERIN, ondansetron, promethazine  Assessment/ Plan:  85 y.o. African-American female with multiple comorbidities including diabetes type 2, hypertension, hyperlipidemia, coronary disease status post CABG, history of stroke, CKD, was admitted on 02/26/2018. Originally admitted to [facility] [place] with Achalasia and 50 pound weight loss, dysarthria  In NOV 2019  1.  Acute kidney injury on chronic kidney disease stage III.  Baseline creatinine probably 1.1 from Nov 2019 2.  Malnutrition causing weight loss, now has G-tube 3.  Generalized edema and pulmonary edema 4.  Chronic systolic congestive heart failure EF 30%.  CAD status post CABG 5.  Stroke with left-sided weakness 6.  Uremia, likely causing altered mental status 7.  Enlarging lymph node in umblical hernia  8.  Anemia of CKD.  Plan:  Patient seen and evaluated during dialysis treatment.  She has been undergoing daily dialysis for the past week.  PermCath to be placed today.  We will plan for ongoing daily hemodialysis for now.  We will need to continue ultrafiltration given generalized edema and volume overload.  Consider blood transfusion for hemoglobin of 7 or less.  We will continue to monitor her progress closely.   LOS: 7 Nelle Sayed 2/3/202010:31 AM  [place], [place]  Note: This note was prepared with Dragon dictation. Any transcription errors are unintentional

## 2018-03-05 NOTE — Progress Notes (Signed)
HD Tx started w/o issue    03/05/18 0821  Vital Signs  Pulse Rate 98  Pulse Rate Source Monitor  Resp 19  BP 124/73  BP Location Left Arm  BP Method Automatic  Patient Position (if appropriate) Lying  Oxygen Therapy  SpO2 100 %  O2 Device Room Air  Pulse Oximetry Type Continuous  During Hemodialysis Assessment  Blood Flow Rate (mL/min) 300 mL/min  Arterial Pressure (mmHg) -160 mmHg  Venous Pressure (mmHg) 150 mmHg  Transmembrane Pressure (mmHg) 70 mmHg  Ultrafiltration Rate (mL/min) 830 mL/min  Dialysate Flow Rate (mL/min) 600 ml/min  Conductivity: Machine  14  HD Safety Checks Performed Yes  Dialysis Fluid Bolus Normal Saline  Bolus Amount (mL) 250 mL  Intra-Hemodialysis Comments Tx initiated  Hemodialysis Catheter Right Femoral vein  Placement Date: 02/26/18   Orientation: Right  Access Location: Femoral vein  Site Condition No complications  Blue Lumen Status Infusing  Red Lumen Status Infusing

## 2018-03-05 NOTE — H&P (Signed)
[facility] History & Physical Update  The patient was interviewed and re-examined.  [name] previous History and Physical has been reviewed and is unchanged.  There is no change in the plan of care. We plan to proceed with the scheduled procedure.  Leotis Pain, MD  03/05/2018, 2:41 PM

## 2018-03-05 NOTE — Progress Notes (Signed)
Pt's son states that pt "does not respond to zofran" and that "the doctor should know this as she was at [facility] for a long time". Pt's son insisted that pt receive phenergan IV prior to HD. Pt's son refused IV zofran for pt. Pt son states that "I am a doctor and you need to speak with the MD before dialysis". Pt's son educated that he is able to refuse zofran for pt and that RN will give phenergan, however, we are not delaying HD due to a non-emergent questions for MD as this would further delay care and possibly pt's procedure for the rest of the day. Pt's son verbalizes understanding, transport at bedside. Pt given phenergan - see MAR for details.

## 2018-03-05 NOTE — Progress Notes (Signed)
Post HD Tx    03/05/18 1123  Hand-Off documentation  Report given to (Full Name) Hurman Horn, RN   Report received from (Full Name) Beatris Ship, RN   Vital Signs  Temp 98.6 F (37 C)  Temp Source Oral  Pulse Rate 91  Pulse Rate Source Monitor  Resp 18  BP 110/63  BP Location Left Arm  BP Method Automatic  Patient Position (if appropriate) Lying  Oxygen Therapy  SpO2 100 %  O2 Device Room Air  Dialysis Weight  Weight 79.2 kg  Type of Weight Post-Dialysis  Post-Hemodialysis Assessment  Rinseback Volume (mL) 250 mL  KECN 52.6 V  Dialyzer Clearance Lightly streaked  Duration of HD Treatment -hour(s) 3 hour(s)  Hemodialysis Intake (mL) 500 mL  UF Total -Machine (mL) 2513 mL  Net UF (mL) 2013 mL  Tolerated HD Treatment Yes  Hemodialysis Catheter Right Femoral vein  Placement Date: 02/26/18   Orientation: Right  Access Location: Femoral vein  Site Condition No complications  Blue Lumen Status Heparin locked  Red Lumen Status Heparin locked  Post treatment catheter status Capped and Clamped

## 2018-03-05 NOTE — Progress Notes (Addendum)
Pt transported to specials. 

## 2018-03-05 NOTE — Progress Notes (Signed)
HD Tx completed, tolerated well, uf goal met    03/05/18 1120  Vital Signs  Pulse Rate 88  Pulse Rate Source Monitor  Resp 13  BP 113/65  BP Location Left Arm  BP Method Automatic  Patient Position (if appropriate) Lying  Oxygen Therapy  SpO2 96 %  O2 Device Room Air  During Hemodialysis Assessment  Blood Flow Rate (mL/min) 300 mL/min  Arterial Pressure (mmHg) -170 mmHg  Venous Pressure (mmHg) 150 mmHg  Transmembrane Pressure (mmHg) 70 mmHg  Ultrafiltration Rate (mL/min) 830 mL/min  Dialysate Flow Rate (mL/min) 600 ml/min  Conductivity: Machine  14.4  HD Safety Checks Performed Yes  Intra-Hemodialysis Comments Tolerated well;Tx completed

## 2018-03-05 NOTE — Progress Notes (Signed)
Post HD assessment    03/05/18 1125  Neurological  Orientation Level Oriented to person  Respiratory  Respiratory Pattern Regular  Chest Assessment Chest expansion symmetrical  Bilateral Breath Sounds Clear;Diminished  Cough None  Cardiac  Pulse Regular  Heart Sounds S1, S2  ECG Monitor Yes  Vascular  R Radial Pulse +1  L Radial Pulse +1  Edema Generalized  Generalized Edema +2  Psychosocial  Psychosocial (WDL) WDL  Patient Behaviors Calm;Cooperative  Emotional support given Given to patient

## 2018-03-05 NOTE — Progress Notes (Signed)
Pre HD Assessment    03/05/18 0815  Neurological  Level of Consciousness Alert  Orientation Level Oriented to person  Respiratory  Respiratory Pattern Regular  Chest Assessment Chest expansion symmetrical  Bilateral Breath Sounds Clear;Diminished  Cough None  Cardiac  Pulse Regular  Heart Sounds S1, S2  ECG Monitor Yes  Vascular  R Radial Pulse +1  L Radial Pulse +1  Edema Generalized  Generalized Edema +2  Psychosocial  Psychosocial (WDL) WDL  Patient Behaviors Calm;Cooperative  Emotional support given Given to patient

## 2018-03-05 NOTE — Op Note (Signed)
OPERATIVE NOTE    PRE-OPERATIVE DIAGNOSIS: 1. ESRD   POST-OPERATIVE DIAGNOSIS: same as above  PROCEDURE: 1. Ultrasound guidance for vascular access to the right internal jugular vein 2. Fluoroscopic guidance for placement of catheter 3. Placement of a 19 cm tip to cuff tunneled hemodialysis catheter via the right internal jugular vein  SURGEON: Leotis Pain, MD  ANESTHESIA:  Local with Moderate conscious sedation for approximately 15 minutes using 2 mg of Versed and 25 mcg of Fentanyl  ESTIMATED BLOOD LOSS: 5 cc  FLUORO TIME: less than one minute  CONTRAST: none  FINDING(S): 1.  Patent right internal jugular vein  SPECIMEN(S):  None  INDICATIONS:   [name] is a 85 y.o.female who presents with renal failure.  The patient needs long term dialysis access for their ESRD, and a Permcath is necessary.  Risks and benefits are discussed and informed consent is obtained.    DESCRIPTION: After obtaining full informed written consent, the patient was brought back to the vascular suited. [name] right neck and chest were sterilely prepped and draped in a sterile surgical field was created. Moderate conscious sedation was administered during a face to face encounter with the patient throughout the procedure with my supervision of the RN administering medicines and monitoring the patient's vital signs, pulse oximetry, telemetry and mental status throughout from the start of the procedure until the patient was taken to the recovery room.  The right internal jugular vein was visualized with ultrasound and found to be patent. It was then accessed under direct ultrasound guidance and a permanent image was recorded. A wire was placed. After skin nick and dilatation, the peel-away sheath was placed over the wire. I then turned my attention to an area under the clavicle. Approximately 1-2 fingerbreadths below the clavicle a small counterincision was created and tunneled from the subclavicular  incision to the access site. Using fluoroscopic guidance, a 19 centimeter tip to cuff tunneled hemodialysis catheter was selected, and tunneled from the subclavicular incision to the access site. It was then placed through the peel-away sheath and the peel-away sheath was removed. Using fluoroscopic guidance the catheter tips were parked in the right atrium.  Of note, using fluoroscopy the tip of her previously placed PICC line was seen in the right subclavian/innominate vein.  The appropriate distal connectors were placed. It withdrew blood well and flushed easily with heparinized saline and a concentrated heparin solution was then placed. It was secured to the chest wall with 2 Prolene sutures. The access incision was closed single 4-0 Monocryl. A 4-0 Monocryl pursestring suture was placed around the exit site. Sterile dressings were placed. The patient tolerated the procedure well and was taken to the recovery room in stable condition.  COMPLICATIONS: None  CONDITION: Stable  Leotis Pain, MD 03/05/2018 3:25 PM   This note was created with Dragon Medical transcription system. Any errors in dictation are purely unintentional.

## 2018-03-05 NOTE — Progress Notes (Signed)
Pre HD Tx   03/05/18 0820  Vital Signs  Temp 98.7 F (37.1 C)  Temp Source Oral  Pulse Rate 98  Pulse Rate Source Monitor  Resp 19  BP 120/73  BP Location Left Arm  BP Method Automatic  Patient Position (if appropriate) Lying  Oxygen Therapy  SpO2 100 %  O2 Device Room Air  Pulse Oximetry Type Continuous  Pain Assessment  Pain Scale 0-10  Pain Score 0  Dialysis Weight  Weight 81.8 kg  Type of Weight Pre-Dialysis  Time-Out for Hemodialysis  What Procedure? HD  Pt Identifiers(min of two) First/Last Name;MRN/Account#  Correct Site? Yes  Correct Side? Yes  Correct Procedure? Yes  Consents Verified? Yes  Rad Studies Available? N/A  Safety Precautions Reviewed? Yes  Engineer, civil (consulting) Number 7  Station Number 1  UF/Alarm Test Passed  Conductivity: Meter 14  Conductivity: Machine  14.1  pH 7.4  Reverse Osmosis Main  Normal Saline Lot Number [id number]  Dialyzer Lot Number [id number]  Disposable Set Lot Number [id number]  Machine Temperature 98.6 F (37 C)  Musician and Audible Yes  Blood Lines Intact and Secured Yes  Pre Treatment Patient Checks  Vascular access used during treatment Catheter  Hepatitis B Surface Antigen Results Negative  Date Hepatitis B Surface Antigen Drawn 02/26/18  Hepatitis B Surface Antibody  (<10)  Date Hepatitis B Surface Antibody Drawn 02/26/18  Hemodialysis Consent Verified Yes  Hemodialysis Standing Orders Initiated Yes  ECG (Telemetry) Monitor On Yes  Prime Ordered Normal Saline  Length of  DialysisTreatment -hour(s) 3 Hour(s)  Dialysis Treatment Comments Na 140  Dialyzer Elisio 17H NR  Dialysate 3K, 2.5 Ca  Dialysis Anticoagulant None  Dialysate Flow Ordered 600  Blood Flow Rate Ordered 300 mL/min  Ultrafiltration Goal 2 Liters  Dialysis Blood Pressure Support Ordered Normal Saline  Education / Care Plan  Dialysis Education Provided No (Comment) (Pt only oriented to self )  Documented Education in Care Plan Yes   Hemodialysis Catheter Right Femoral vein  Placement Date: 02/26/18   Orientation: Right  Access Location: Femoral vein  Site Condition No complications  Blue Lumen Status Flushed;Blood return noted  Red Lumen Status Flushed;Blood return noted  Purple Lumen Status Capped (Central line)  Dressing Type Biopatch;Gauze/Drain sponge  Dressing Status Clean;Dry;Intact

## 2018-03-05 NOTE — Progress Notes (Addendum)
Nutrition Follow Up Note   DOCUMENTATION CODES:   Not applicable  INTERVENTION:   Increase Osmolite 1.5 to goal rate of 23m/hr   Prostat 364mBID via tube   Free water flushes 204m 4hrs  Regimen provides 1640kcal/day, 90g/day protein, 852m30mee water  Liquid MVI daily via tube  B-complex with C daily via tube  Juven Fruit Punch BID, each serving provides 95kcal and 2.5g of protein (amino acids glutamine and arginine)  NUTRITION DIAGNOSIS:   Inadequate oral intake related to dysphagia as evidenced by NPO status.  GOAL:   Patient will meet greater than or equal to 90% of their needs  -met with tube feeds  MONITOR:   Labs, Weight trends, TF tolerance, Skin, I & O's  ASSESSMENT:   89 y48. female with a known history of acute metabolic encephalopathy, combined systolic diastolic congestive heart failure moderate MR, acute CVA, ESBL Klebsiella aspiration pneumonia and enterococcus UTI, acute non-STEMI [facility] and dysphagia due to achalasia. [facility]    Pt tolerating tube feeds at 30ml13m will plan to increase to goal rate today. Pt had one episode of vomiting on 2/1 after medication administration, tube feeds were held during the day and then restarted that night. Continue vitamins and supplements via tube. Pt NPO for perm cath placement today. Per chart, pt down 12lbs since admit.   Medications reviewed and include: B complex with C, vitamin D3, plavix, pepcid, insulin, protonix, miralax   Labs reviewed: Na 140 wnl, K 4.3 wnl, BUN 66(H), creat 2.38(H) Hgb 7.7(L), Hct 25.9(L) cbgs- 198, 188, 172, 111 x 24 hrs  Diet Order:   Diet Order            Diet NPO time specified  Diet effective now             EDUCATION NEEDS:   Not appropriate for education at this time  Skin:  Skin Assessment: Reviewed RN Assessment(unstageable wound buttocks, MASD )  Last BM:  2/3- TYPE  5  Height:   Ht Readings from Last 1 Encounters:  12/20/17 _0  (1.6 m)    Weight:   Wt Readings from Last 1 Encounters:  03/05/18 81.8 kg    Ideal Body Weight:  52.3 kg  BMI:  Body mass index is 31.95 kg/m.  Estimated Nutritional Needs:   Kcal:  1400-1600kcal/day   Protein:  78-89g/day   Fluid:  UOP +1L  CaseyKoleen DistanceRD, LDN Pager #- [phone]#- [phone] Hours Pager: [phone]

## 2018-03-05 NOTE — Care Management (Signed)
Barrier Trial daily HD for 2 weeks to see if patient renal status improves.  [name] with [employer] is aware and working on outpatient chair palcement

## 2018-03-05 NOTE — Progress Notes (Signed)
Blood sugar rechecked at the request of pt's son. CBG was 172. Pt refusing to talk this AM.

## 2018-03-05 NOTE — Progress Notes (Signed)
[name], female   DOB: 31-Aug-1927, 85 y.o.   MRN: [id number] Ethics Note:  Patient followed extensively by ethics at [facility] with [facility]'s intention to invoke the futility policy in this patient with severe comorbid disease and incapacitated to make her own medical decisions.  Patient solely transferred to [facility] for a 2 week trial of dialysis. Questions that must be addressed by the medical team and particularly by nephrology- 1. What findings or symptoms constitute a failure of the 2 week dialysis trial? 2. If the patient improves to a degree and comes off dialysis but were to worsen again, would nephrology invoke the futility policy from their point of view before resuming dialysis? 3. Have the expectations of what constitutes a dialysis trial failure been well communicated to her son well before the end of the trial?   I strongly encourage the medical team and nephrology to fully discuss the medical team's expectations of signs of recovery by this and that date - basically setting medical deadlines - with her son so everything is very clear as her medical management continues.  If she were to fail the dialysis trial, the [facility] futility policy may be enforced depending on the care team's opinion.  Contact me for any questions.  Emily Filbert MD [place]

## 2018-03-06 ENCOUNTER — Inpatient Hospital Stay: Payer: Medicare Other

## 2018-03-06 DIAGNOSIS — N179 Acute kidney failure, unspecified: Secondary | ICD-10-CM

## 2018-03-06 DIAGNOSIS — T85598D Other mechanical complication of other gastrointestinal prosthetic devices, implants and grafts, subsequent encounter: Secondary | ICD-10-CM

## 2018-03-06 DIAGNOSIS — J9601 Acute respiratory failure with hypoxia: Secondary | ICD-10-CM

## 2018-03-06 LAB — PROCALCITONIN: Procalcitonin: 2.11 ng/mL

## 2018-03-06 LAB — CBC
HCT: 26.6 % — ABNORMAL LOW (ref 36.0–46.0)
Hemoglobin: 7.8 g/dL — ABNORMAL LOW (ref 12.0–15.0)
MCH: 29.8 pg (ref 26.0–34.0)
MCHC: 29.3 g/dL — ABNORMAL LOW (ref 30.0–36.0)
MCV: 101.5 fL — ABNORMAL HIGH (ref 80.0–100.0)
Platelets: 177 10*3/uL (ref 150–400)
RBC: 2.62 MIL/uL — ABNORMAL LOW (ref 3.87–5.11)
RDW: 16.7 % — ABNORMAL HIGH (ref 11.5–15.5)
WBC: 8 10*3/uL (ref 4.0–10.5)
nRBC: 0.4 % — ABNORMAL HIGH (ref 0.0–0.2)

## 2018-03-06 LAB — BLOOD GAS, ARTERIAL
Acid-Base Excess: 4.6 mmol/L — ABNORMAL HIGH (ref 0.0–2.0)
Bicarbonate: 31.6 mmol/L — ABNORMAL HIGH (ref 20.0–28.0)
FIO2: 0.28
O2 Saturation: 98.9 %
Patient temperature: 37
pCO2 arterial: 56 mmHg — ABNORMAL HIGH (ref 32.0–48.0)
pH, Arterial: 7.36 (ref 7.350–7.450)
pO2, Arterial: 132 mmHg — ABNORMAL HIGH (ref 83.0–108.0)

## 2018-03-06 LAB — LIPASE, BLOOD: Lipase: 27 U/L (ref 11–51)

## 2018-03-06 LAB — PHOSPHORUS: Phosphorus: 3 mg/dL (ref 2.5–4.6)

## 2018-03-06 LAB — GLUCOSE, CAPILLARY
GLUCOSE-CAPILLARY: 96 mg/dL (ref 70–99)
Glucose-Capillary: 110 mg/dL — ABNORMAL HIGH (ref 70–99)
Glucose-Capillary: 161 mg/dL — ABNORMAL HIGH (ref 70–99)
Glucose-Capillary: 97 mg/dL (ref 70–99)
Glucose-Capillary: 130 mg/dL — ABNORMAL HIGH (ref 70–99)

## 2018-03-06 LAB — AMYLASE: Amylase: 56 U/L (ref 28–100)

## 2018-03-06 MED ORDER — SODIUM CHLORIDE 0.9 % IV SOLN
3.0000 g | Freq: Two times a day (BID) | INTRAVENOUS | Status: DC
  Administered 2018-03-07 – 2018-03-09 (×6): 3 g via INTRAVENOUS
  Filled 2018-03-06 (×8): qty 3

## 2018-03-06 MED ORDER — METOCLOPRAMIDE HCL 5 MG/5ML PO SOLN
5.0000 mg | Freq: Four times a day (QID) | ORAL | Status: DC | PRN
  Filled 2018-03-06 (×2): qty 5

## 2018-03-06 MED ORDER — AMOXICILLIN-POT CLAVULANATE 400-57 MG/5ML PO SUSR
400.0000 mg | Freq: Two times a day (BID) | ORAL | Status: DC
  Filled 2018-03-06: qty 5

## 2018-03-06 MED ORDER — SODIUM CHLORIDE 0.9 % IV BOLUS
250.0000 mL | Freq: Once | INTRAVENOUS | Status: AC
  Administered 2018-03-06: 250 mL via INTRAVENOUS

## 2018-03-06 MED ORDER — INSULIN ASPART 100 UNIT/ML ~~LOC~~ SOLN: 0.0000 [IU] | SUBCUTANEOUS | Status: DC

## 2018-03-06 MED ORDER — IOPAMIDOL (ISOVUE-300) INJECTION 61%
50.0000 mL | Freq: Once | INTRAVENOUS | Status: AC | PRN
  Administered 2018-03-06: 50 mL

## 2018-03-06 NOTE — Progress Notes (Signed)
[facility] Daily Progress Note   Communication Note 1 Day Post-Op:  1. Ultrasound guidance for vascular access to the right internal jugular vein 2. Fluoroscopic guidance for placement of catheter 3. Placement of a 19cm tip to cuff tunneled hemodialysis catheter via the right internal jugular vein  Permcath insertion successful. If patient needs permanent dialysis access in the future we will be happy to see as an outpatient to plan. Patient will need cardiac clearance before surgery. Vascular surgery will sign off at this time. Please re-consult if needed.  Discussed with Dr. Ellis Parents Krystian Ferrentino PA-C 03/06/2018 1:22 PM

## 2018-03-06 NOTE — Progress Notes (Signed)
HD held for to today r/t unstable BP, per MD orders.    03/06/18 1455  Vital Signs  Pulse Rate 87  Pulse Rate Source Monitor  Resp 14  BP (!) 72/25  BP Location Left Arm  BP Method Automatic  Patient Position (if appropriate) Lying  Oxygen Therapy  SpO2 90 %  O2 Device Nasal Cannula  O2 Flow Rate (L/min) 2 L/min

## 2018-03-06 NOTE — Progress Notes (Signed)
Nutrition Follow Up Note   DOCUMENTATION CODES:   Not applicable  INTERVENTION:   Increase Osmolite 1.5 to goal rate of 68ml/hr   Prostat 23ml BID via tube   Free water flushes 78ml q 4hrs  Regimen provides 1640kcal/day, 90g/day protein, 85ml free water  Liquid MVI daily via tube  B-complex with C daily via tube  Juven Fruit Punch BID, each serving provides 95kcal and 2.5g of protein (amino acids glutamine and arginine)  NUTRITION DIAGNOSIS:   Inadequate oral intake related to dysphagia as evidenced by NPO status.  GOAL:   Patient will meet greater than or equal to 90% of their needs  -progressing with tube feeds  MONITOR:   Labs, Weight trends, TF tolerance, Skin, I & O's  ASSESSMENT:   85 y.o. female with a known history of acute metabolic encephalopathy, combined systolic diastolic congestive heart failure moderate MR, acute CVA, ESBL Klebsiella aspiration pneumonia and enterococcus UTI, acute non-STEMI was admitted at [facility] early part of November with weight loss and dysphagia due to achalasia. Pt transferred to [facility] for HD initiation    Pt s/p perm cath placement 2/3  Pt tolerating tube feeds well overnight at 52ml/hr. Spoke to RN who reports food backing up out of the tube today. RN feels pt's stomach is emptying slowly. Radiograph from 1/28 reports pt's tube possibly turned over on itself. Radiograph from today reports stable appearance of gastrostomy catheter. Radiographs repeated with contrast; contrast noted within the stomach, duodenum, and proximal small bowel. Recommend change out 3-way stopcock as last time tube was clogged, this was the problem.   Spoke with Dr. Mayer Masker today who is concerned about pt's nausea and vomiting. Unsure of what may be causing pt's intermittent vomiting; possible gastroparesis? Intolerance to meds? Pt has vomited twice after medication administration. Dr. Mayer Masker is concerned pt may have pancreatitis; amylase and lipase checked  today and were wnl. Pt unable to take reglan as Dr. Mayer Masker reports she was unable to tolerate this at [facility]. Son reports pt got tardive dyskinesia. There is concern for the use of erythromycin secondary to pt's elevated liver enzymes. Bethanechol may be a possibility; MD wants to hold off for now. Another possible concern may be intolerance to oral iron. Spoke to Nephrology, will discontinue vitamin D3 and iron and give hecterol and venofer 3 times weekly during HD. Plan will be to try and get pt up to goal tube feeds overnight.   Per chart, pt down 17lbs since admit but still ~33kg up from her UBW     Medications reviewed and include: B complex with C, plavix, pepcid, insulin, MVI, protonix, miralax   Labs reviewed: P 3.0 wnl, amylase 56, lipase 27 cbgs- 110, 161, 97 x 24 hrs  Diet Order:   Diet Order            Diet NPO time specified  Diet effective now             EDUCATION NEEDS:   Not appropriate for education at this time  Skin:  Skin Assessment: Reviewed RN Assessment(unstageable wound buttocks, MASD )  Last BM:  2/4- type 7   Height:   Ht Readings from Last 1 Encounters:  03/05/18 5\' 3"  (1.6 m)    Weight:   Wt Readings from Last 1 Encounters:  03/06/18 79.4 kg    Ideal Body Weight:  52.3 kg  BMI:  Body mass index is 31.01 kg/m.  Estimated Nutritional Needs:   Kcal:  1400-1600kcal/day  Protein:  78-89g/day   Fluid:  UOP +1L  Koleen Distance MS, RD, LDN Pager #[phone] Office#- [phone] After Hours Pager: [phone]

## 2018-03-06 NOTE — Progress Notes (Signed)
eLink Physician-Brief Progress Note Patient Name: [name] DOB: 05-Aug-1927 MRN: [id number]   Date of Service  03/06/2018  HPI/Events of Note  Pt with ESRD and dementia admitted for trial of Dialysis then transferred to the ICU for hypotension and altered mental status. Blood pressure improved with fluid bolus but pt remains quite drowsy, She received Phenergan at some point earlier.  eICU Interventions  Continue to monitor mental status and blood pressure. Vasopressor for recurrence of hypotension. Background discussions are ongoing regarding possible futility of Rx at this point in her medical status.        Kerry Kass Sharyn Brilliant 03/06/2018, 7:45 PM

## 2018-03-06 NOTE — Progress Notes (Signed)
[name], [name] 03/06/18  Subjective:  Patient lethargic but arousable today. Due for dialysis again later today. Azotemia has significantly improved.  Objective:  Vital signs in last 24 hours:  Temp:  [97.5 F (36.4 C)-98.2 F (36.8 C)] 97.7 F (36.5 C) (02/04 1151) Pulse Rate:  [53-99] 59 (02/04 1151) Resp:  [12-20] 20 (02/04 1151) BP: (98-117)/(54-65) 99/60 (02/04 1151) SpO2:  [60 %-100 %] 92 % (02/04 1151) Weight:  [79.4 kg] 79.4 kg (02/04 0552)  Weight change:  Filed Weights   03/05/18 1123 03/05/18 1149 03/06/18 0552  Weight: 79.2 kg 79.2 kg 79.4 kg    Intake/Output:    Intake/Output Summary (Last 24 hours) at 03/06/2018 1459 Last data filed at 03/06/2018 1044 Gross per 24 hour  Intake 103.33 ml  Output 0 ml  Net 103.33 ml     Physical Exam: General:  Chronically ill-appearing  HEENT  dry oral mucous membranes  Neck  supple  Pulm/lungs  Scattered rhonchi, normal effort  CVS/Heart  no rub, irregular  Abdomen:   Soft, nontender, nondistended  Extremities:  2+ pitting edema   Neurologic:  Lethargic but arousable  Skin:  No acute rashes  Access:  Right IJ permcath        Basic Metabolic Panel:  Recent Labs  Lab 02/28/18 0501 03/01/18 0418 03/02/18 0617 03/03/18 0548 03/04/18 0812 03/05/18 0445 03/06/18 1308  NA 130* 133* 133* 134* 137 140  --   K 3.8 3.7 4.0 3.9 3.8 4.3  --   CL 95* 98 98 100 101 103  --   CO2 25 27 24 25 30 29   --   GLUCOSE 151* 165* 256* 121* 171* 214*  --   BUN 113* 93* 73* 55* 46* 66*  --   CREATININE 3.77* 3.32* 2.76* 2.17* 1.94* 2.38*  --   CALCIUM 8.3* 8.0* 8.0* 8.3* 8.6* 9.1  --   MG  --  2.2  --   --   --   --   --   PHOS 4.5 4.0  --   --   --   --  3.0     CBC: Recent Labs  Lab 03/01/18 0418 03/02/18 0617 03/03/18 0548 03/04/18 0812 03/05/18 0445  WBC 6.4 8.2 7.3 8.3 9.6  HGB 7.1* 7.9* 7.9* 7.7* 7.7*  HCT 23.4* 25.9* 26.2* 25.6* 25.9*  MCV 98.3 98.9 99.2 100.0 99.6  PLT  217 243 230 227 222      Lab Results  Component Value Date   HEPBSAG Negative 02/26/2018   HEPBSAB Non Reactive 02/26/2018   HEPBIGM Negative 02/26/2018      Microbiology:  Recent Results (from the past 240 hour(s))  MRSA PCR Screening     Status: Abnormal   Collection Time: 02/26/18  5:39 PM  Result Value Ref Range Status   MRSA by PCR POSITIVE (A) NEGATIVE Final    Comment:        The GeneXpert MRSA Assay (FDA approved for NASAL specimens only), is one component of a comprehensive MRSA colonization surveillance program. It is not intended to diagnose MRSA infection nor to guide or monitor treatment for MRSA infections. CRITICAL RESULT CALLED TO, READ BACK BY AND VERIFIED WITH:  CALLED TO DANA DUGGINS RN @1920  Performed at [facility], [address] [place], [place] [address]     Coagulation Studies: No results for input(s): LABPROT, INR in the last 72 hours.  Urinalysis: No results for input(s): COLORURINE, LABSPEC, [place], [place], [place], [place],  KETONESUR, PROTEINUR, UROBILINOGEN, NITRITE, LEUKOCYTESUR in the last 72 hours.  Invalid input(s): APPERANCEUR    Imaging: Dg Abd 1 View  Result Date: 03/06/2018 CLINICAL DATA:  Gastrostomy catheter  dysfunction EXAM: ABDOMEN - 1 VIEW COMPARISON:  02/27/2018 FINDINGS: Gastrostomy catheter is noted adjacent to the T tacks in the anterior abdominal wall stable from the previous exam. Right femoral dialysis catheter is again noted as well as a jugular placed dialysis catheter in the right atrium. IMPRESSION: Stable appearance of gastrostomy catheter. Contrast injection would be required to assess for internal patency. Electronically Signed   By: Inez Catalina M.D.   On: 03/06/2018 11:21     Medications:   . albumin human Stopped (03/03/18 1340)  . feeding supplement (OSMOLITE 1.5 CAL) 1,000 mL (03/05/18 2221)   . atorvastatin  40 mg Per Tube q1800  . B-complex with vitamin C  1 tablet Per Tube Daily   . chlorhexidine  15 mL Mouth Rinse BID  . clopidogrel  75 mg Per Tube QHS  . collagenase   Topical Daily  . famotidine  10 mg Per Tube Daily  . feeding supplement (PRO-STAT SUGAR FREE 64)  30 mL Per Tube BID  . free water  20 mL Per Tube Q4H  . insulin aspart  0-15 Units Subcutaneous Q6H  . mouth rinse  15 mL Mouth Rinse q12n4p  . metoprolol tartrate  12.5 mg Per Tube BID  . multivitamin  15 mL Per Tube QHS  . nutrition supplement (JUVEN)  1 packet Per Tube BID BM  . pantoprazole sodium  40 mg Per Tube Daily  . polyethylene glycol  17 g Oral Daily   acetaminophen (TYLENOL) oral liquid 160 mg/5 mL **OR** acetaminophen **OR** acetaminophen, albuterol, ipratropium, lip balm, Melatonin, nitroGLYCERIN, ondansetron, promethazine  Assessment/ Plan:  85 y.o. African-American female with multiple comorbidities including diabetes type 2, hypertension, hyperlipidemia, coronary disease status post CABG, history of stroke, CKD, was admitted on 02/26/2018. Originally admitted to [name] with Achalasia and 50 pound weight loss, dysarthria  In NOV 2019  1.  Acute kidney injury on chronic kidney disease stage III.  Baseline creatinine probably 1.1 from Nov 2019 2.  Malnutrition causing weight loss, now has G-tube 3.  Generalized edema and pulmonary edema 4.  Chronic systolic congestive heart failure EF 30%.  CAD status post CABG 5.  Stroke with left-sided weakness 6.  Uremia, likely causing altered mental status 7.  Enlarging lymph node in umblical hernia  8.  Anemia of CKD.  Plan:  Patient is tolerated initiation of dialysis fairly well.  Azotemia significantly improved as BUN down to 66.  Creatinine currently 2.38.  Right internal jugular PermCath has been placed.  We will remove temporary dialysis catheter today.  Still remains a bit lethargic.  We will plan for additional dialysis today to further treat her uremia.  Hemoglobin currently 7.7.  Continue to monitor CBC closely.  Family  meeting to occur Wednesday or Thursday.   LOS: 8 Blase Beckner 2/4/20202:59 PM  [place], [place]  Note: This note was prepared with Dragon dictation. Any transcription errors are unintentional

## 2018-03-06 NOTE — Care Management (Addendum)
Patient has received 7 dialysis treatments out of the anticipated 14.  She is not sitting for the treatments and is not able to do so. She had a permcath  inserted today.  She was unable to have dialysis today because her blood pressure dropped to 73 systolic There is a meeting today at [facility] with members of care team, and administration.

## 2018-03-06 NOTE — Progress Notes (Signed)
Reassessed patient.  Continues to be drowsy with shallow breathing.  7-8 times a minute respiratory rate.  Improves when she is woken up.  High risk for deterioration.  Discussed with ICU attending Dr. Lorenza [place].  Will transfer to stepdown at this point till she is stabilized.

## 2018-03-06 NOTE — Significant Event (Signed)
Rapid response called- patient found to be drowsy- patient had low BP and was receiving at 270ml bolus at the time- vitals stable in low 464'V systolic. Patient only given phenergan at approximately 10am per bedside RN. ABG was received and pH was 7.36, CO2 56 - Dr. Darvin Neighbours at bedside and stated to continue to have bedside RN to monitor patient's vitals and mentation and will reassess.

## 2018-03-06 NOTE — Plan of Care (Addendum)
1630 Rapid response called. Patient resp shallow 7/min and non-responsive. CXR and Blood cultures completed and both results ok.  Dr. Molli Hazard responded to page and had Dr. Darvin Neighbours round.  Dr. Also notified and observed fluid still in G-tube (even though has been stopped for several hours.) Orders to hold feedings till GI can round in AM. 250cc NS bolus currently running to correct Low BP ( - will re-assess after bolus.  Son notified by Dr.     Jamal Maes Bolus complete. BP now 112/67. But, patient remains at 6-7/resp min and shallow.  Dr. Ree Kida.

## 2018-03-06 NOTE — Progress Notes (Signed)
[facility] [place] NAME: [name]    MR#:  [id number]  DATE OF BIRTH:  02/16/1927  SUBJECTIVE:  CHIEF COMPLAINT:  No chief complaint on file. Here for dialysis.  less alert today. S/p permacath placement, drowsy. Nurse had also noted her having difficulty to push through her G tube.  REVIEW OF SYSTEMS:  ROS unable to assess, due to acuity of medical condition.  DRUG ALLERGIES:   Allergies  Allergen Reactions  . Reglan [Metoclopramide]     Pt's son reports this causes patient tremors and neurologic issues   . Contrast Media [Iodinated Diagnostic Agents]     Sensitivity, kidney issues   VITALS:  Blood pressure 115/63, pulse 84, temperature (!) 97.4 F (36.3 C), temperature source Axillary, resp. rate (!) 8, height 5\' 3"  (1.6 m), weight 73.7 kg, SpO2 100 %. PHYSICAL EXAMINATION:  GENERAL:Laying in bed, not in any acute distress, talk and responds to voice. HEAD: Atraumatic, normocephalic. NECK: Supple. No distention on neck veins. HEART: Regular rate and rhythm. No murmurs, no rubs, no clicks auscultated. LUNGS: Clear to auscultation anteriorly based on limited exam. ABDOMEN: Soft,nontender, Bowel sounds normoactive. EXTREMITIES: No evidence of any cyanosis, clubbing, +2radial pulses bilaterally.2+ pitting edema bilaterally in upper and lower extremities. NEUROLOGIC: More drowsy today, , Does not follow commands Not waking up enough to follow commands. SKIN:Warm with no rashes. ACCESS:Right femoral dialysis catheter, right check Permacath.  LABORATORY PANEL:  Female CBC Recent Labs  Lab 03/06/18 1625  WBC 8.0  HGB 7.8*  HCT 26.6*  PLT 177   ------------------------------------------------------------------------------------------------------------------ Chemistries  Recent Labs  Lab 03/01/18 0418  03/04/18 0812 03/05/18 0445  NA 133*   < > 137 140  K 3.7   < > 3.8 4.3  CL 98   < > 101 103  CO2 27   <  > 30 29  GLUCOSE 165*   < > 171* 214*  BUN 93*   < > 46* 66*  CREATININE 3.32*   < > 1.94* 2.38*  CALCIUM 8.0*   < > 8.6* 9.1  MG 2.2  --   --   --   AST  --   --  118*  --   ALT  --   --  134*  --   ALKPHOS  --   --  152*  --   BILITOT  --   --  0.9  --    < > = values in this interval not displayed.    Intake/Output Summary (Last 24 hours) at 03/06/2018 2342 Last data filed at 03/06/2018 1821 Gross per 24 hour  Intake 870.22 ml  Output 0 ml  Net 870.22 ml   Dialysis -  Net UF (mL) 2056 mL    RADIOLOGY:  Dg Chest 1 View  Result Date: 03/06/2018 CLINICAL DATA:  Shortness of breath.  New PermCath. EXAM: CHEST  1 VIEW COMPARISON:  03/02/2018 FINDINGS: The right IJ PermCath tips are in good position the distal SVC and right atrium. No complicating features. A right-sided PICC line is also in place with its tip near the subclavian jugular junction. The heart is enlarged but stable. There is central vascular congestion, bilateral pleural effusions and bibasilar atelectasis or infiltrates. No overt pulmonary edema. IMPRESSION: Right IJ PermCath in good position without complicating features. The PICC line is also stable. Cardiac enlargement, pleural effusions and bibasilar infiltrates or atelectasis. Moderate central vascular congestion without overt pulmonary edema. Electronically Signed  By: Marijo Sanes M.D.   On: 03/06/2018 16:32   Dg Abd 1 View  Result Date: 03/06/2018 CLINICAL DATA:  Gastrostomy tube obstruction. EXAM: ABDOMEN - 1 VIEW COMPARISON:  KUB 03/06/2018 and 02/27/2018.  Chest x-ray 03/02/2018. FINDINGS: Right PICC line in stable position. Right IJ large caliber central line noted with tip over right atrium. Prior CABG. Cardiomegaly. Bibasilar infiltrates/edema and bilateral pleural effusions again noted. Findings suggest CHF. Bibasilar atelectasis. No pneumothorax. Gastrostomy tube noted. Contrast noted in the stomach, duodenum, and proximal small bowel. IMPRESSION: 1. Right PICC  line stable position. Right IJ large caliber central line noted with tip over right atrium. Prior CABG. Cardiomegaly. Bibasilar infiltrates/edema and bilateral pleural effusions. Findings again suggest CHF. Bibasilar atelectasis. 2. Gastrostomy tube noted over the stomach. Contrast noted within the stomach, duodenum, and proximal small bowel. Electronically Signed   By: Marcello Moores  Register   On: 03/06/2018 15:26   Dg Abd 1 View  Result Date: 03/06/2018 CLINICAL DATA:  Gastrostomy catheter  dysfunction EXAM: ABDOMEN - 1 VIEW COMPARISON:  02/27/2018 FINDINGS: Gastrostomy catheter is noted adjacent to the T tacks in the anterior abdominal wall stable from the previous exam. Right femoral dialysis catheter is again noted as well as a jugular placed dialysis catheter in the right atrium. IMPRESSION: Stable appearance of gastrostomy catheter. Contrast injection would be required to assess for internal patency. Electronically Signed   By: Inez Catalina M.D.   On: 03/06/2018 11:21   Previous CXR reads: CLINICAL DATA:  85 year old female with vomiting. Query aspiration. Renal failure, status post NSTEMI last month, heart failure. Recent infections. Shortness of breath.  EXAM: PORTABLE CHEST 1 VIEW  COMPARISON:  0707 hours earlier today.  FINDINGS: Portable AP semi upright view at 1007 hours. Stable right PICC line.  Stable cardiomegaly and mediastinal contours. Stable veiling and confluent bibasilar opacity from earlier today, with some pleural fluid tracking in the right minor fissure. No new areas of pulmonary opacity. No pneumothorax. Stable upper lobe vascularity.  A proximal right humerus deformity is chronic and stable. Paucity of bowel gas in the upper abdomen.  IMPRESSION: Stable from earlier today. Bilateral pleural effusions and lower lobe collapse or consolidation with no new cardiopulmonary abnormality.  Electronically Signed   By: Genevie Ann M.D.   On: 02/20/2018  10:46 ASSESSMENT AND PLAN:   [name] is an56 y.o.femalewith multiple comorbidities including diabetes type 2, hypertension, hyperlipidemia, coronary disease status post CABG, history of stroke, and CKD who was admitted on1/27/2020. [facility] and achalasiaand 50 pound weight loss in 12/2017.Status post treatment for ESBL Klebsiella aspiration pneumonia, enterococcus UTIStatus post treatment for ESBL Klebsiella aspiration pneumonia, enterococcus UTI. Admitted for dialysis.  1. Acute kidney injury on chronic kidney disease stage III. Uremia, likely causing altered mental status/acute metabolic encephalopathy - complicated by CHF, poor nutrition, cardio renal syndrome.Son and daughter were persistent and would like to give trial of dialysis to patient. They do understand the risk and complications involved in the process of dialysis. - Nephrologyfollowing; patient receiveddialysis treatment almost daily. Urine output low. Willcontinue tomonitor patient's ability to tolerate due to her frail status and critical illness - daily labs CBC, BMP - vascular accessin place for HD -Dr. Candiss Norse has also given albumin injection during dialysis 03/02/18 - s/p permacath.  2.Acute on chronic mild systolic, diastolic congestive heart failure with moderate MR and bilateral pleural effusion with and Asarca and hypoalbuminemia, pending improvement with HD - cardiac meds previously held at family request  for hypotension, metoprolol restarted at family request -Holding isosorbide-hydralazine, patient not hypertensive on single agent at this time with low diastolic pressures - small dose metoprolol. Will hold again due to hypotension.  3.Dysphagia secondary to achalasia. Patient failed Botox and status post peg tube feeding - patient has been having symptoms of vomiting. She has been getting tube feeding at 20 mL an hour. -recs for Tube feeding per  nutrition, in place per GI c/s - daughter requested 03/02/2018 to stop all tube feeds for concern of aspiration on emesis , but resume now as patient is improving.   4.Type II diabetes -sliding scale insulin  5.History of left temporal and frontal punctate CVA -continue Plavix and Lipitor  6.Status post non-STEMI -continue Lipitor Plavix and Lopressor  7. Lymph node within umbilical hernia, son declined core biopsyaccording to [facility] clinicalnotesGeneral surgery [facility], no further recommendations at this time.---will need to readdress with son to r/o malignancy  8. Deconditioning, frailty  - PTtoevaluateand treatment ordered, as tolerated [place] need to sit in chair for Out pt HD>  9. Hypothermia Resolved. Bear hugger as needed  10. Hypotension    Episodes today noticed before HD. No HD today.    Responded to IV fluids. Xray chest to r/o aspiration. As pt had some nausea episodes for last 1-2 days- son requested ABx- added augmentin.  Additional: Family had requests heparin to be held.  All the records are reviewed and case is discussed with Care Management/Social Worker. Management plans discussed with the patient and/or family and they are in agreement.  CODE STATUS: Full Code  TOTAL TIME TAKING CARE OF THIS PATIENT: 30 minutes.   More than 50% of the time was spent in counseling/coordination of care: YES  D/C  DEPENDING ON CLINICAL CONDITION.  Vaughan Basta PA-C on 03/06/2018 at 11:42 PM  Between 7am to 6pm - Pager - [phone]  After 6 pm go to [email or web address] - Proofreader  [facility]  Office  [phone]  CC: Primary care physician; Deland Pretty, MD

## 2018-03-06 NOTE — Progress Notes (Signed)
Pharmacy Antibiotic Note  [name] is a 85 y.o. female admitted on 02/26/2018 with aspiration pneumonia.  Pharmacy has been consulted for Unasyn dosing.  Plan: Unasyn 3 grams q 12 hours ordered  Height: 5\' 3"  (160 cm) Weight: 162 lb 7.7 oz (73.7 kg) IBW/kg (Calculated) : 52.4  Temp (24hrs), Avg:97.6 F (36.4 C), Min:97.4 F (36.3 C), Max:97.7 F (36.5 C)  Recent Labs  Lab 03/01/18 0418 03/02/18 0617 03/03/18 0548 03/04/18 0812 03/05/18 0445 03/06/18 1625  WBC 6.4 8.2 7.3 8.3 9.6 8.0  CREATININE 3.32* 2.76* 2.17* 1.94* 2.38*  --     Estimated Creatinine Clearance: 15.7 mL/min (A) (by C-G formula based on SCr of 2.38 mg/dL (H)).    Allergies  Allergen Reactions  . Reglan [Metoclopramide]     Pt's son reports this causes patient tremors and neurologic issues   . Contrast Media [Iodinated Diagnostic Agents]     Sensitivity, kidney issues    Antimicrobials this admission: Unasyn 2/5  >>    >>   Dose adjustments this admission:   Microbiology results: 2/5 BCx: pending 2/5 Sputum: pending       2/4 CXR: pending  Thank you for allowing pharmacy to be a part of this patient's care.  Kerensa Nicklas S 03/06/2018 11:49 PM

## 2018-03-06 NOTE — Consult Note (Signed)
PULMONARY / CRITICAL CARE MEDICINE   Name: [name] MRN: [id number] DOB: Jul 15, 1927    ADMISSION DATE:  02/26/2018 CONSULTATION DATE:  03/06/2018  REFERRING MD:  Dr. Darvin Neighbours  CHIEF COMPLAINT:  Hypotension, Lethargy  BRIEF DISCUSSION: 85 y.o. Female transferred to [facility] from [facility] on 02/26/18 for trial of Hemodialysis.  She was treated at [facility] Dysphagia secondary to Achalasia and weight loss.  She is also status post treatment at [facility] for ESBL Klebsiella aspiration pneumonia and Enterococcus UTI.  Rapid response was called on 03/06/18 due to Hypotension (SBP in 70's) and lethargy requiring transfer to [facility] unit.   HISTORY OF PRESENT ILLNESS:   [name] is a 85 year old female past medical history of CAD, Combined Systolic & Diastolic CHF, CKD III, HTN, Pleural Effusion, CVA, DM II who was transferred [facility] to [facility] on 02/26/18 for a trial of hemodialysis.  She was treated at [facility] for dysphagia secondary to Achalasia, weight loss, ESBL Klebsiella aspiration pneumonia and Enterococcus UTI.  She had been tolerating intermittent hemodialysis since 02/26/18.  She had a right IJ PermCath placed on 03/05/18.  On 03/06/18 she was hypotensive (SBP in 70's) and lethargic prompting a rapid response to be called with subsequent transfer to [facility] unit. It is reported that she was administered a dose of Phenergan earlier in the day.  For her hypotension she received 250 ml Normal saline bolus with resolution of hypotension, and began to arouse to voice.  ABG with pH 7/36 / CO2 56 / pO2 132 / Bicarb 31.6.  WBC 8, Hemoglobin 7.8, and CXR with pleural effusions and bibasilar infiltrates concerning for Atelectasis vs. Aspiration.  PCCM is consulted for further management.   PAST MEDICAL HISTORY :  She  has a past medical history of Acute renal injury ([place]) (07/2016), Anginal pain ([place]) (2004; 2011), CAD (coronary artery disease), Cardiomyopathy, Chronic kidney disease (CKD),  stage III (moderate) ([place]), History of blood transfusion (1960s), HTN (hypertension), Pleural effusion (2011), Shortness of breath, Stroke ([place]) (09/19/2016), and Type II diabetes mellitus ([place]).  PAST SURGICAL HISTORY: She  has a past surgical history that includes Cataract extraction w/ intraocular lens  implant, bilateral (Bilateral); Coronary artery bypass graft (2011); Coronary angioplasty with stent (2004); Esophagogastroduodenoscopy (egd) with propofol (N/A, 11/20/2017); Botox injection (11/20/2017); IR GASTROSTOMY TUBE MOD SED (12/10/2017); and DIALYSIS/PERMA CATHETER INSERTION (N/A, 03/05/2018).  Allergies  Allergen Reactions  . Reglan [Metoclopramide]     Pt's son reports this causes patient tremors and neurologic issues   . Contrast Media [Iodinated Diagnostic Agents]     Sensitivity, kidney issues    No current facility-administered medications on file prior to encounter.    Current Outpatient Medications on File Prior to Encounter  Medication Sig  . acetaminophen (TYLENOL) 160 MG/5ML solution Place 15.6 mLs (500 mg total) into feeding tube every 6 (six) hours as needed for mild pain or fever.  Marland Kitchen albuterol (PROVENTIL) (2.5 MG/3ML) 0.083% nebulizer solution Take 3 mLs (2.5 mg total) by nebulization every 4 (four) hours as needed for wheezing or shortness of breath.  Marland Kitchen amLODipine (NORVASC) 10 MG tablet Take 10 mg by mouth daily.  . clopidogrel (PLAVIX) 75 MG tablet Place 1 tablet (75 mg total) into feeding tube daily.  Marland Kitchen glipiZIDE (GLUCOTROL XL) 10 MG 24 hr tablet Take 10 mg by mouth daily with breakfast.  . isosorbide mononitrate (IMDUR) 30 MG 24 hr tablet Take 30 mg by mouth daily.  . Melatonin 3 MG TABS Take 1 tablet (  3 mg total) by mouth at bedtime as needed (insomnia).  . metoprolol tartrate (LOPRESSOR) 50 MG tablet Take 50 mg by mouth 2 (two) times daily.  . nitroGLYCERIN (NITROSTAT) 0.4 MG SL tablet Place 1 tablet (0.4 mg total) under the tongue every 5 (five) minutes x 3  doses as needed for chest pain.  Marland Kitchen ondansetron (ZOFRAN) 4 MG/2ML SOLN injection Inject 2 mLs (4 mg total) into the vein every 8 (eight) hours as needed for nausea or vomiting (nausea, vomiting).  Marland Kitchen atorvastatin (LIPITOR) 40 MG tablet Place 1 tablet (40 mg total) into feeding tube daily at 6 PM.  . chlorhexidine (PERIDEX) 0.12 % solution 15 mLs by Mouth Rinse route 2 (two) times daily.  . cholecalciferol (VITAMIN D3) 10 MCG (400 UNIT) TABS tablet Place 1 tablet (400 Units total) into feeding tube daily.  . collagenase (SANTYL) ointment Apply topically daily.  . famotidine (PEPCID) 10 MG tablet Place 1 tablet (10 mg total) into feeding tube daily.  . ferrous sulfate 300 (60 Fe) MG/5ML syrup Place 5 mLs (300 mg total) into feeding tube daily.  Marland Kitchen ipratropium (ATROVENT) 0.02 % nebulizer solution Take 2.5 mLs (0.5 mg total) by nebulization every 6 (six) hours as needed for wheezing or shortness of breath.  . mouth rinse LIQD solution 15 mLs by Mouth Rinse route 2 times daily at 12 noon and 4 pm.  . Multiple Vitamin (MULTIVITAMIN) LIQD Place 15 mLs into feeding tube daily.  . Nutritional Supplements (FEEDING SUPPLEMENT, NEPRO CARB STEADY,) LIQD Place 1,000 mLs into feeding tube daily.  . patiromer (VELTASSA) 8.4 g packet Take 1 packet (8.4 g total) by mouth daily.  . promethazine (PHENERGAN) 25 MG/ML injection Inject 0.25 mLs (6.25 mg total) into the vein every 6 (six) hours as needed for nausea or vomiting.  . Water For Irrigation, Sterile (FREE WATER) SOLN Place 30 mLs into feeding tube every 8 (eight) hours.    FAMILY HISTORY:  Her She indicated that her mother is deceased. She indicated that her father is deceased. She indicated that her maternal grandmother is deceased. She indicated that her maternal grandfather is deceased. She indicated that her paternal grandmother is deceased. She indicated that her paternal grandfather is deceased. She indicated that the status of her neg hx is  unknown.   SOCIAL HISTORY: She  reports that she has never smoked. She has never used smokeless tobacco. She reports that she does not drink alcohol or use drugs.  REVIEW OF SYSTEMS:   Unable to assess due to lethargy and Altered Mental Status  SUBJECTIVE:  Unable to assess due to lethargy and Altered Mental Status Pt on 2L Nasal cannula Reported by Nursing that PEG tube is not functional (not flushing), awaiting evaluation by GI  VITAL SIGNS: BP 106/76   Pulse 87   Temp (!) 97.4 F (36.3 C) (Axillary)   Resp 14   Ht 5\' 3"  (1.6 m)   Wt 73.7 kg   SpO2 100%   BMI 28.78 kg/m   HEMODYNAMICS:    VENTILATOR SETTINGS:    INTAKE / OUTPUT: I/O last 3 completed shifts: In: 870.2 [NG/GT:620; IV Piggyback:250.2] Out: 2013 [Other:2013]  PHYSICAL EXAMINATION: General:  Acute on Chronically ill appearing female, laying in bed, on 2L Nasal cannula, in NAD Neuro:  Lethargic, arouses to voice, follows simple commands with RUE, withdraws from pain to bilateral LE.  Not moving LUE (not a change from previous documentation, hx of CVA), mumbles, Pupils PERRLA 3 mm brisk bilaterally HEENT:  Atraumatic, normocephalic, neck supple, no JVD Cardiovascular:  RRR, no M/R/G, 1+ pulses thoughout Lungs:  Clear diminished bilaterally to auscultation, even, nonlabored, normal effort, no assessory muscle use Abdomen:  Soft, nontender, nondistended, no guarding or rebound tenderness, BS Hypoactive.  PEG tube in place, site is clean and dry, no drainage Musculoskeletal:  No deformities, no cyanosis. 3+ pitting edema all extremities Skin:  Warm/dry.  No obvious rashes, lesions, or ulcerations  LABS:  BMET Recent Labs  Lab 03/03/18 0548 03/04/18 0812 03/05/18 0445  NA 134* 137 140  K 3.9 3.8 4.3  CL 100 101 103  CO2 25 30 29   BUN 55* 46* 66*  CREATININE 2.17* 1.94* 2.38*  GLUCOSE 121* 171* 214*    Electrolytes Recent Labs  Lab 02/28/18 0501 03/01/18 0418  03/03/18 0548 03/04/18 0812  03/05/18 0445 03/06/18 1308  CALCIUM 8.3* 8.0*   < > 8.3* 8.6* 9.1  --   MG  --  2.2  --   --   --   --   --   PHOS 4.5 4.0  --   --   --   --  3.0   < > = values in this interval not displayed.    CBC Recent Labs  Lab 03/04/18 0812 03/05/18 0445 03/06/18 1625  WBC 8.3 9.6 8.0  HGB 7.7* 7.7* 7.8*  HCT 25.6* 25.9* 26.6*  PLT 227 222 177    Coag's No results for input(s): APTT, INR in the last 168 hours.  Sepsis Markers No results for input(s): LATICACIDVEN, PROCALCITON, O2SATVEN in the last 168 hours.  ABG Recent Labs  Lab 03/06/18 1646  PHART 7.36  PCO2ART 56*  PO2ART 132*    Liver Enzymes Recent Labs  Lab 03/04/18 0812  AST 118*  ALT 134*  ALKPHOS 152*  BILITOT 0.9  ALBUMIN 2.0*    Cardiac Enzymes No results for input(s): TROPONINI, PROBNP in the last 168 hours.  Glucose Recent Labs  Lab 03/05/18 1717 03/06/18 0104 03/06/18 0536 03/06/18 1141 03/06/18 1641 03/06/18 1753  GLUCAP 93 110* 161* 97 130* 96    Imaging Dg Chest 1 View  Result Date: 03/06/2018 CLINICAL DATA:  Shortness of breath.  New PermCath. EXAM: CHEST  1 VIEW COMPARISON:  03/02/2018 FINDINGS: The right IJ PermCath tips are in good position the distal SVC and right atrium. No complicating features. A right-sided PICC line is also in place with its tip near the subclavian jugular junction. The heart is enlarged but stable. There is central vascular congestion, bilateral pleural effusions and bibasilar atelectasis or infiltrates. No overt pulmonary edema. IMPRESSION: Right IJ PermCath in good position without complicating features. The PICC line is also stable. Cardiac enlargement, pleural effusions and bibasilar infiltrates or atelectasis. Moderate central vascular congestion without overt pulmonary edema. Electronically Signed   By: Marijo Sanes M.D.   On: 03/06/2018 16:32   Dg Abd 1 View  Result Date: 03/06/2018 CLINICAL DATA:  Gastrostomy tube obstruction. EXAM: ABDOMEN - 1 VIEW  COMPARISON:  KUB 03/06/2018 and 02/27/2018.  Chest x-ray 03/02/2018. FINDINGS: Right PICC line in stable position. Right IJ large caliber central line noted with tip over right atrium. Prior CABG. Cardiomegaly. Bibasilar infiltrates/edema and bilateral pleural effusions again noted. Findings suggest CHF. Bibasilar atelectasis. No pneumothorax. Gastrostomy tube noted. Contrast noted in the stomach, duodenum, and proximal small bowel. IMPRESSION: 1. Right PICC line stable position. Right IJ large caliber central line noted with tip over right atrium. Prior CABG. Cardiomegaly. Bibasilar infiltrates/edema and bilateral pleural  effusions. Findings again suggest CHF. Bibasilar atelectasis. 2. Gastrostomy tube noted over the stomach. Contrast noted within the stomach, duodenum, and proximal small bowel. Electronically Signed   By: Marcello Moores  Register   On: 03/06/2018 15:26   Dg Abd 1 View  Result Date: 03/06/2018 CLINICAL DATA:  Gastrostomy catheter  dysfunction EXAM: ABDOMEN - 1 VIEW COMPARISON:  02/27/2018 FINDINGS: Gastrostomy catheter is noted adjacent to the T tacks in the anterior abdominal wall stable from the previous exam. Right femoral dialysis catheter is again noted as well as a jugular placed dialysis catheter in the right atrium. IMPRESSION: Stable appearance of gastrostomy catheter. Contrast injection would be required to assess for internal patency. Electronically Signed   By: Inez Catalina M.D.   On: 03/06/2018 11:21     STUDIES:  CT Abdomen & Pelvis wo Contrast 02/09/18>> Lymph node noted within umbilical hernia on prior exam has significantly increased in size, now measuring 2.6 cm in maximum diameter. This is highly concerning for malignancy and tissue sampling is recommended. These results will be called to the ordering clinician or representative by the Radiologist Assistant, and communication documented in the PACS or zVision Dashboard. Moderate bilateral pleural effusions are noted with  adjacent subsegmental atelectasis. Moderate anasarca. Enlarged fibroid uterus. Gastrostomy tube is noted. Echocardiogram 02/16/18>> Normal LV size with diffuse hypokinesis, EF 30%. Moderate diastolic dysfunction. Mildly dilated RV with moderately decreased systolic function. Moderate mitral regurgitation. Bilateral Lower Extremity Ultrasound 02/17/18>>Right: No evidence of common femoral vein obstruction. Left: There is no evidence of deep vein thrombosis in the lower extremity.    CULTURES: Blood x2 03/06/18>> Sputum 03/06/18>>  ANTIBIOTICS: Augmentin 2/4>>2/4 (did not receive dose due to dysfunctional PEG) Unasyn 2/4>> Cefazolin 2/3>>2/3 (surgical prophylaxis)  SIGNIFICANT EVENTS: 02/26/18>> Admission to [facility] Med[facility]Surg unit for Hemodialysis trials 03/05/18>> Permcath HD catheter inserted 03/06/18>> Rapid response called due to Hypotension and lethargy, transferred to Stepdown  LINES/TUBES: PICC Line 12/09/17>> Right Femoral Temporary HD catheter 02/26/18>> Right IJ Permcath 03/05/18>>   ASSESSMENT / PLAN:  PULMONARY A: Acute Hypoxic Respiratory Failure in setting of Acute on chronic combined CHF and volume overload with AKI, and ? Aspiration -CXR 2/4>> Pleural effusions and bibasilar infiltrates concerning for Aspiration vs. atelectasis Hx: Pleural effusions P:   Supplemental O2 as needed to maintain O2 sats >92% Follow intermittent CXR and ABG as needed Volume removal via Hemodialysis as per Nephrology Incentive spirometry as tolerated Pulmonary hygiene Obtain sputum culture Will place on empiric Unasyn   CARDIOVASCULAR A:  Hypotension, likely Hypovolemic shock in setting of poor PO intake +/- Cardiogenic shock in setting of CHF with EF 30% +/- Septic shock in setting of ? Aspiration Acute on Chronic Combined Systolic & Diastolic CHF Cardiorenal Syndrome Hx: NSTEMI, CAD, HTN P:  Cardiac monitoring Maintain MAP >65 Received 250 ml NS bolus on floor with resolution of  hypotension Levophed if needed to maintain MAP goal WBC within normal limits, but will obtain cultures for sepsis workup and follow CXR Volume removal via Hemodialysis as per Nephrology Albumin as per Nephrology Hold Metoprolol Continue Plavix and Lipitor  RENAL A:   AKI on CKD III P:   Monitor I&O's / urinary output Follow BMP Ensure adequate renal perfusion Avoid nephrotoxic agents as able Replace electrolytes as indicated Nephrology following, appreciate input Hemodialysis as per Nephrology  GASTROINTESTINAL A:   Dysphagia secondary to Achalasia Dysfunctional PEG tube P:   NPO for now, hold feeds GI consulted, appreciate input  HEMATOLOGIC A:   Anemia without signs of  active bleeding P:  Monitor for S/Sx of bleeding Trend CBC SCD's for VTE Prophylaxis  Transfuse for Hgb <7  INFECTIOUS A:   CXR 2/4 concerning for bilateral infiltrates vs. Atelectasis concerning for possible Aspiration P:   Monitor fever curve Trend WBC's and Procalcitonin Follow CXR Given brief hypotension, will Obtain blood and sputum cultures Will place on empiric Unasyn Consider removing temporary Right Femoral HD catheter  ENDOCRINE A:   Diabetes Mellitus II   P:   CBG's SSI Follow ICU Hypo/hyperglycemia protocol  NEUROLOGIC A:   Acute Metabolic Encephalopathy, likely in setting of Uremia +/- Phenergan administration 2/4 Hx: CVA P:   Provide supportive care Avoid sedating meds as able Promote normal sleep/wake cycle Frequent neuro checks Consider Head CT if mentation doesn't improve  FAMILY  - Updates: Updated pt's son at bedside  03/06/18. Pt is a Full Code.  - Inter-disciplinary family meet or Palliative Care meeting due by:  03/13/2018    Darel Hong, AGACNP-BC [facility] Pager: [phone] Cell: [phone]  03/06/2018, 8:56 PM

## 2018-03-06 NOTE — Plan of Care (Signed)
Dr. Ree Kida of G-tube not absorbing well.  Bedside x-ray order to confirm placement.  Tube feeding stopped while waiting on x-rays to be read.

## 2018-03-06 NOTE — Progress Notes (Signed)
   03/06/18 1600  Clinical Encounter Type  Visited With Patient not available;Other (Comment)  Visit Type Initial;Code  Referral From Nurse  Consult/Referral To Chaplain  Chaplain received call from Roney Jaffe to check on patient, there was a RR. Chaplain arrived at room and care team was working on patient. Chaplain asked were there any family member and staff stated no. Chaplain waiting around briefly and another nurse from out the patient room told Chaplain there were no family member. Chaplain offered to be page if needed.

## 2018-03-06 NOTE — Progress Notes (Addendum)
Patient seen and examined for rapid response. Hypotension and normal saline 250 mL bolus given with improvement of blood pressure Patient continues to be drowsy.  Received a dose of Phenergan earlier in the day. Blood glucose 132.  ABG with normal pH.  PCO2 56.  Patient wakes up on calling her name.  Confused. S1, S2 Lungs with good air entry.  Right lower lobe rhonchi Abdomen PEG tube in place.  No discharge around PEG tube. Permacath site looks clean  *Hypotension.  Responded to IV fluids.  At this time will monitor blood pressure.  Patient has had low normal blood pressure over the past few days.  If she has recurrent hypotension will have to move to ICU for pressor support.  She missed her hemodialysis and I am concerned she will get fluid overloaded if she receives more fluid.  *Lethargy.  Likely due to Phenergan.  Would avoid.  * End stage renal disease.  Missed her hemodialysis today due to hypotension.  *PEG tube malfunction.  Placement seems to be in position with x-ray.  But unable to continue tube feeds.  Will hold.  GI to evaluate in the morning.  Discussed with Dr. Anselm Jungling and updated.  He has updated son child symptoms.

## 2018-03-06 NOTE — Progress Notes (Signed)
[facility] NAME: [name]    MR#:  [id number]  DATE OF BIRTH:  September 12, 1927  SUBJECTIVE:  CHIEF COMPLAINT:  No chief complaint on file. Here for dialysis.  less alert today. S/p permacath placement, drowsy.  REVIEW OF SYSTEMS:  ROS unable to assess, due to acuity of medical condition.  DRUG ALLERGIES:   Allergies  Allergen Reactions  . Reglan [Metoclopramide]     Pt's son reports this causes patient tremors and neurologic issues   . Contrast Media [Iodinated Diagnostic Agents]     Sensitivity, kidney issues   VITALS:  Blood pressure 115/63, pulse 84, temperature (!) 97.4 F (36.3 C), temperature source Axillary, resp. rate (!) 8, height 5\' 3"  (1.6 m), weight 73.7 kg, SpO2 100 %. PHYSICAL EXAMINATION:  GENERAL:Laying in bed, not in any acute distress, talk and responds to voice. HEAD: Atraumatic, normocephalic. NECK: Supple. No distention on neck veins. HEART: Regular rate and rhythm. No murmurs, no rubs, no clicks auscultated. LUNGS: Clear to auscultation anteriorly based on limited exam. ABDOMEN: Soft,nontender, Bowel sounds normoactive. EXTREMITIES: No evidence of any cyanosis, clubbing, +2radial pulses bilaterally.2+ pitting edema bilaterally in upper and lower extremities. NEUROLOGIC: More drowsy today, , Does not follow commands   Able to move right upper extremity but left upper extremity is not moving, power 0 out of 5 in left upper extremity and both lower extremity SKIN:Warm with no rashes. ACCESS:Right femoral dialysis catheter, right check Permacath.  LABORATORY PANEL:  Female CBC Recent Labs  Lab 03/06/18 1625  WBC 8.0  HGB 7.8*  HCT 26.6*  PLT 177   ------------------------------------------------------------------------------------------------------------------ Chemistries  Recent Labs  Lab 03/01/18 0418  03/04/18 0812 03/05/18 0445  NA 133*   < > 137 140  K 3.7   < > 3.8 4.3  CL  98   < > 101 103  CO2 27   < > 30 29  GLUCOSE 165*   < > 171* 214*  BUN 93*   < > 46* 66*  CREATININE 3.32*   < > 1.94* 2.38*  CALCIUM 8.0*   < > 8.6* 9.1  MG 2.2  --   --   --   AST  --   --  118*  --   ALT  --   --  134*  --   ALKPHOS  --   --  152*  --   BILITOT  --   --  0.9  --    < > = values in this interval not displayed.    Intake/Output Summary (Last 24 hours) at 03/06/2018 2336 Last data filed at 03/06/2018 1821 Gross per 24 hour  Intake 870.22 ml  Output 0 ml  Net 870.22 ml   Dialysis -  Net UF (mL) 2056 mL    RADIOLOGY:  Dg Chest 1 View  Result Date: 03/06/2018 CLINICAL DATA:  Shortness of breath.  New PermCath. EXAM: CHEST  1 VIEW COMPARISON:  03/02/2018 FINDINGS: The right IJ PermCath tips are in good position the distal SVC and right atrium. No complicating features. A right-sided PICC line is also in place with its tip near the subclavian jugular junction. The heart is enlarged but stable. There is central vascular congestion, bilateral pleural effusions and bibasilar atelectasis or infiltrates. No overt pulmonary edema. IMPRESSION: Right IJ PermCath in good position without complicating features. The PICC line is also stable. Cardiac enlargement, pleural effusions and bibasilar infiltrates or atelectasis. Moderate central vascular  congestion without overt pulmonary edema. Electronically Signed   By: Marijo Sanes M.D.   On: 03/06/2018 16:32   Dg Abd 1 View  Result Date: 03/06/2018 CLINICAL DATA:  Gastrostomy tube obstruction. EXAM: ABDOMEN - 1 VIEW COMPARISON:  KUB 03/06/2018 and 02/27/2018.  Chest x-ray 03/02/2018. FINDINGS: Right PICC line in stable position. Right IJ large caliber central line noted with tip over right atrium. Prior CABG. Cardiomegaly. Bibasilar infiltrates/edema and bilateral pleural effusions again noted. Findings suggest CHF. Bibasilar atelectasis. No pneumothorax. Gastrostomy tube noted. Contrast noted in the stomach, duodenum, and proximal small  bowel. IMPRESSION: 1. Right PICC line stable position. Right IJ large caliber central line noted with tip over right atrium. Prior CABG. Cardiomegaly. Bibasilar infiltrates/edema and bilateral pleural effusions. Findings again suggest CHF. Bibasilar atelectasis. 2. Gastrostomy tube noted over the stomach. Contrast noted within the stomach, duodenum, and proximal small bowel. Electronically Signed   By: Marcello Moores  Register   On: 03/06/2018 15:26   Dg Abd 1 View  Result Date: 03/06/2018 CLINICAL DATA:  Gastrostomy catheter  dysfunction EXAM: ABDOMEN - 1 VIEW COMPARISON:  02/27/2018 FINDINGS: Gastrostomy catheter is noted adjacent to the T tacks in the anterior abdominal wall stable from the previous exam. Right femoral dialysis catheter is again noted as well as a jugular placed dialysis catheter in the right atrium. IMPRESSION: Stable appearance of gastrostomy catheter. Contrast injection would be required to assess for internal patency. Electronically Signed   By: Inez Catalina M.D.   On: 03/06/2018 11:21   Previous CXR reads: CLINICAL DATA:  85 year old female with vomiting. Query aspiration. Renal failure, status post NSTEMI last month, heart failure. Recent infections. Shortness of breath.  EXAM: PORTABLE CHEST 1 VIEW  COMPARISON:  0707 hours earlier today.  FINDINGS: Portable AP semi upright view at 1007 hours. Stable right PICC line.  Stable cardiomegaly and mediastinal contours. Stable veiling and confluent bibasilar opacity from earlier today, with some pleural fluid tracking in the right minor fissure. No new areas of pulmonary opacity. No pneumothorax. Stable upper lobe vascularity.  A proximal right humerus deformity is chronic and stable. Paucity of bowel gas in the upper abdomen.  IMPRESSION: Stable from earlier today. Bilateral pleural effusions and lower lobe collapse or consolidation with no new cardiopulmonary abnormality.  Electronically Signed   By: Genevie Ann  M.D.   On: 02/20/2018 10:46 ASSESSMENT AND PLAN:   [name] is an85 y.o.femalewith multiple comorbidities including diabetes type 2, hypertension, hyperlipidemia, coronary disease status post CABG, history of stroke, and CKD who was admitted on1/27/2020. [facility] and achalasiaand 50 pound weight loss in 12/2017.Status post treatment for ESBL Klebsiella aspiration pneumonia, enterococcus UTIStatus post treatment for ESBL Klebsiella aspiration pneumonia, enterococcus UTI. Admitted for dialysis.  1. Acute kidney injury on chronic kidney disease stage III. Uremia, likely causing altered mental status/acute metabolic encephalopathy - complicated by CHF, poor nutrition, cardio renal syndrome.Son and daughter were persistent and would like to give trial of dialysis to patient. They do understand the risk and complications involved in the process of dialysis. - Nephrologyfollowing; patient receiveddialysis treatment almost daily. Urine output low. Willcontinue tomonitor patient's ability to tolerate due to her frail status and critical illness - daily labs CBC, BMP - vascular accessin place for HD -Dr. Candiss Norse has also given albumin injection during dialysis 03/02/18 - s/p permacath.  2.Acute on chronic mild systolic, diastolic congestive heart failure with moderate MR and bilateral pleural effusion with and Asarca and hypoalbuminemia, pending improvement with  HD - cardiac meds previously held at family request for hypotension, metoprolol restarted at family request -Holding isosorbide-hydralazine, patient not hypertensive on single agent at this time with low diastolic pressures - small dose metoprolol.  3.Dysphagia secondary to achalasia. Patient failed Botox and status post peg tube feeding - patient has been having symptoms of vomiting. She has been getting tube feeding at 20 mL an hour. -recs for Tube feeding per nutrition, in  place per GI c/s - daughter requested 03/02/2018 to stop all tube feeds for concern of aspiration on emesis , but resume now as patient is improving.   4.Type II diabetes -sliding scale insulin  5.History of left temporal and frontal punctate CVA -continue Plavix and Lipitor  6.Status post non-STEMI -continue Lipitor Plavix and Lopressor  7. Lymph node within umbilical hernia, son declined core biopsyaccording to [place] clinicalnotesGeneral [facility] no further recommendations at this time.---will need to readdress with son to r/o malignancy  8. Deconditioning, frailty  - PTtoevaluateand treatment ordered, as tolerated [place] need to sit in chair for Out pt HD>  9. Hypothermia Resolved. Bear hugger as needed  Additional: Family had requests heparin to be held.  All the records are reviewed and case is discussed with Care Management/Social Worker. Management plans discussed with the patient and/or family and they are in agreement.  CODE STATUS: Full Code  TOTAL TIME TAKING CARE OF THIS PATIENT: 30 minutes.   More than 50% of the time was spent in counseling/coordination of care: YES  D/C  DEPENDING ON CLINICAL CONDITION.  Vaughan Basta PA-C on 03/06/2018 at 11:36 PM  Between 7am to 6pm - Pager - [phone]  After 6 pm go to [email or web address] - Proofreader  [facility]  Office  [phone]  CC: Primary care physician; Deland Pretty, MD

## 2018-03-06 NOTE — Progress Notes (Addendum)
Restarted tube feeds at a rate of 68mL per hour. Held medications due to risk for aspiration and history of nausea/vomiting. Will pass along to day shift nurse to increase feeds to 85mL per hour at or around 8AM. Son at the bedside and in agreement with plan.

## 2018-03-07 ENCOUNTER — Inpatient Hospital Stay: Payer: Medicare Other

## 2018-03-07 LAB — GLUCOSE, CAPILLARY
Glucose-Capillary: 103 mg/dL — ABNORMAL HIGH (ref 70–99)
Glucose-Capillary: 132 mg/dL — ABNORMAL HIGH (ref 70–99)
Glucose-Capillary: 190 mg/dL — ABNORMAL HIGH (ref 70–99)
Glucose-Capillary: 73 mg/dL (ref 70–99)
Glucose-Capillary: 82 mg/dL (ref 70–99)
Glucose-Capillary: 84 mg/dL (ref 70–99)
Glucose-Capillary: 90 mg/dL (ref 70–99)
Glucose-Capillary: 97 mg/dL (ref 70–99)

## 2018-03-07 LAB — COMPREHENSIVE METABOLIC PANEL
ALK PHOS: 144 U/L — AB (ref 38–126)
ALT: 298 U/L — ABNORMAL HIGH (ref 0–44)
AST: 248 U/L — ABNORMAL HIGH (ref 15–41)
Albumin: 1.8 g/dL — ABNORMAL LOW (ref 3.5–5.0)
Anion gap: 6 (ref 5–15)
BUN: 58 mg/dL — ABNORMAL HIGH (ref 8–23)
CO2: 27 mmol/L (ref 22–32)
Calcium: 8.1 mg/dL — ABNORMAL LOW (ref 8.9–10.3)
Chloride: 107 mmol/L (ref 98–111)
Creatinine, Ser: 2.11 mg/dL — ABNORMAL HIGH (ref 0.44–1.00)
GFR calc Af Amer: 24 mL/min — ABNORMAL LOW (ref >60)
GFR calc non Af Amer: 20 mL/min — ABNORMAL LOW (ref >60)
Glucose, Bld: 106 mg/dL — ABNORMAL HIGH (ref 70–99)
Potassium: 3.6 mmol/L (ref 3.5–5.1)
Sodium: 140 mmol/L (ref 135–145)
Total Bilirubin: 0.8 mg/dL (ref 0.3–1.2)
Total Protein: 5.9 g/dL — ABNORMAL LOW (ref 6.5–8.1)

## 2018-03-07 LAB — CBC
HCT: 26.5 % — ABNORMAL LOW (ref 36.0–46.0)
HEMOGLOBIN: 7.9 g/dL — AB (ref 12.0–15.0)
MCH: 30 pg (ref 26.0–34.0)
MCHC: 29.8 g/dL — ABNORMAL LOW (ref 30.0–36.0)
MCV: 100.8 fL — ABNORMAL HIGH (ref 80.0–100.0)
Platelets: 164 10*3/uL (ref 150–400)
RBC: 2.63 MIL/uL — ABNORMAL LOW (ref 3.87–5.11)
RDW: 16.7 % — ABNORMAL HIGH (ref 11.5–15.5)
WBC: 8.6 10*3/uL (ref 4.0–10.5)
nRBC: 0.3 % — ABNORMAL HIGH (ref 0.0–0.2)

## 2018-03-07 LAB — PROCALCITONIN: PROCALCITONIN: 1.8 ng/mL

## 2018-03-07 MED ORDER — PANTOPRAZOLE SODIUM 40 MG IV SOLR
40.0000 mg | Freq: Every day | INTRAVENOUS | Status: DC
  Administered 2018-03-07 – 2018-03-24 (×17): 40 mg via INTRAVENOUS
  Filled 2018-03-07 (×18): qty 40

## 2018-03-07 MED ORDER — INSULIN ASPART 100 UNIT/ML ~~LOC~~ SOLN
0.0000 [IU] | SUBCUTANEOUS | Status: DC
  Administered 2018-03-07: 3 [IU] via SUBCUTANEOUS
  Administered 2018-03-07: 2 [IU] via SUBCUTANEOUS
  Administered 2018-03-08: 5 [IU] via SUBCUTANEOUS
  Administered 2018-03-08 (×2): 3 [IU] via SUBCUTANEOUS
  Filled 2018-03-07 (×5): qty 1

## 2018-03-07 MED ORDER — DEXTROSE-NACL 5-0.9 % IV SOLN
INTRAVENOUS | Status: DC
  Administered 2018-03-07: 12:00:00 via INTRAVENOUS

## 2018-03-07 NOTE — Plan of Care (Signed)
Son notified via phone of adjusted POC and transfer to [place].

## 2018-03-07 NOTE — Progress Notes (Signed)
CRITICAL CARE NOTE  CC  Altered mental status/lethargy, transient hypotension  SUBJECTIVE -Confusion with baseline dementia   SIGNIFICANT EVENTS -Status post renal evaluation   BP (!) 111/52   Pulse 79   Temp (!) 97.5 F (36.4 C) (Oral)   Resp 16   Ht 5\' 3"  (1.6 m)   Wt 73.7 kg   SpO2 100%   BMI 28.78 kg/m    REVIEW OF SYSTEMS  PATIENT IS UNABLE TO PROVIDE COMPLETE REVIEW OF SYSTEMS DUE TO SEVERE CRITICAL ILLNESS   PHYSICAL EXAMINATION:  GENERAL: No apparent distress, confusion+ HEAD: Normocephalic, atraumatic.  EYES: Pupils equal, round, reactive to light.  No scleral icterus.  MOUTH: Moist mucosal membrane. NECK: Supple. No thyromegaly. No nodules. No JVD.  PULMONARY: +rhonchi,  CARDIOVASCULAR: S1 and S2. Regular rate and rhythm. No murmurs, rubs, or gallops.  GASTROINTESTINAL: Soft, nontender, non--distended. No masses. Positive bowel sounds. No hepatosplenomegaly.  MUSCULOSKELETAL: No swelling, clubbing, or edema.  NEUROLOGIC: obtunded, GCS<12 SKIN:intact,warm,dry      Indwelling Urinary Catheter continued, requirement due to   Reason to continue Indwelling Urinary Catheter for strict Intake/Output monitoring for hemodynamic instability   Central Line continued, requirement due to   Reason to continue [employer] Monitoring of central venous pressure or other hemodynamic parameters   Ventilator continued, requirement due to, resp failure    Ventilator Sedation RASS 0 to -2     ASSESSMENT AND PLAN SYNOPSIS   Acute Hypoxic Respiratory Failure -Improved now on 2 L nasal cannula -Compressive atelectasis secondary to bilateral pleural effusions with right middle lobe hazy opacification suggestive of possible aspiration pneumonia -continue Bronchodilator Therapy -No signs of sepsis-however PCT elevated, we will DC Unasyn in a.m. -Repeat CXR pending for interval changes  Altered mental status-lethargy -Secondary to toxic metabolic  encephalopathy/uremic encephalopathy -Renal team on case appreciate recommendation   CARDIAC FAILURE- -transient hypotension post hemodialysis -initially BP 33/54 -Complicated by heart failure with reduced EF at 30% with grade 2 diastolic dysfunction. -BP improved   Multiple comorbid conditions including but not limited to Dementia, CVA dyshpagia with PEG tube, achalasia , abdominal lymphadenopathy concerning for malignancy -Complicating comorbidities and concern for coagulopathy secondary to neoplasm, overall poor prognosis -We will obtain palliative care consultation    Renal Failure-most likely due to ATN -Renal team on case appreciate recommendations -follow chem 7 -follow UO -continue Foley Catheter-assess need daily   NEUROLOGY -Acute lethargy improving, history of CVA,  baseline dementia,    GI/Nutrition GI PROPHYLAXIS as indicated-protonix 40 daily DIET--> dysphagia, achalasia, PEG tube- D5NS at 30 mL's per hour for now with nutritional consultation additional recommendations  constipation protocol as indicated  ENDO - ICU hypoglycemic\Hyperglycemia protocol -check FSBS per protocol   ELECTROLYTES -follow labs as needed -replace as needed -pharmacy consultation and following   DVT/GI PRX ordered TRANSFUSIONS AS NEEDED MONITOR FSBS ASSESS the need for LABS as needed    Critical Care Time devoted to patient care services described in this note is 36 minutes.     Overall, patient is critically ill, prognosis is guarded.  Patient with Multiorgan failure and at high risk for cardiac arrest and death.     Ottie Glazier, M.D.  Pulmonary & Critical Care Medicine

## 2018-03-07 NOTE — Consult Note (Signed)
[facility] GI Inpatient Consult Note   Kathline Magic, M.D.  Reason for Consult: Dysfunctional PEG tube.   Attending Requesting Consult: Dr. Lanney Gins   History of Present Illness: [name] is a 85 y.o. female with hx of achalasia on Tube feeding who also has dementia. I have been called by the Intensivist due to a nursing report that the feeding tube was not working on the regular floor and that it "would not flush". Patient had a patency study with x-ray done, however, showing contrast passing from the PEG tube to the stomach and then the small bowel. I have been asked, however, to confirm the tube is functional prior the staff attempting to re-use the tube.  Past Medical History:  Past Medical History:  Diagnosis Date  . Acute renal injury ([place]) 07/2016  . Anginal pain ([place]) 2004; 2011  . CAD (coronary artery disease)    status post stenting of the RCA status post CABG. (left internal mammary artery to  LAD, saphenous vein graft to second diagonal, saphenous vein  graft to obtuse marginal 1, saphenous vein graft to posterior  descending).  . Cardiomyopathy     Mildly reduced EF (45%).   . Chronic kidney disease (CKD), stage III (moderate) ([place])   . History of blood transfusion 1960s   "related to anemia"  . HTN (hypertension)   . Pleural effusion 2011   S/P "bypass"  . Shortness of breath   . Stroke ([place]) 09/19/2016   "left sided weakness" (09/20/2016)  . Type II diabetes mellitus ([place])     Problem List: Patient Active Problem List   Diagnosis Date Noted  . Feeding tube dysfunction   . Acute renal failure (ARF) ([place]) 02/26/2018  . Acute metabolic encephalopathy 74/25/9563  . Acute on chronic combined systolic and diastolic CHF (congestive heart failure) ([place]) 02/21/2018  . Anasarca 02/21/2018  . CKD (chronic kidney disease) stage 5, GFR less than 15 ml/min (HCC) 02/11/2018  . Acute respiratory failure ([place])   . NSTEMI (non-ST elevated myocardial infarction)  ([place])   . Palliative care by specialist   . Dysarthria 12/05/2017  . Adult failure to thrive 12/05/2017  . UTI (urinary tract infection) 12/05/2017  . Abnormal loss of weight   . Abnormal finding on GI tract imaging   . Nausea and vomiting   . Dysphagia   . Bilious vomiting with nausea   . Enlarged lymph node   . Pressure injury of skin 11/05/2017  . Dehydration 11/04/2017  . Abdominal pain 11/04/2017  . Quadriceps strain, left, subsequent encounter 05/02/2017  . Rectal bleed 10/13/2016  . Malnutrition of moderate degree 10/13/2016  . Chest pain with moderate risk for cardiac etiology 10/06/2016  . Labile blood pressure   . Stage 3 chronic kidney disease ([place])   . AKI (acute kidney injury) ([place])   . Acute blood loss anemia   . Small vessel disease ([place]) 09/22/2016  . Hemiparesis affecting left side as late effect of cerebrovascular accident (CVA) ([place]) 09/22/2016  . Gait disturbance, post-stroke 09/22/2016  . Stroke (cerebrum) ([place]) 09/20/2016  . Diabetes mellitus with complication ([place])   . Ischemic stroke ([place])   . Status post intraocular lens implant 06/23/2011  . Glaucoma suspect 12/30/2010  . Macular hole 12/30/2010  . Nonproliferative diabetic retinopathy ([place]) 12/30/2010  . Syncope 06/17/2010  . CARDIOMYOPATHY 04/15/2010  . Pleural effusion 04/14/2010  . SHORTNESS OF BREATH 04/14/2010  . CORONARY ATHEROSCLEROSIS NATIVE CORONARY ARTERY 01/19/2009  . Hyperlipidemia 01/06/2009  . Diabetes  mellitus ([place]) 01/05/2009  . Essential hypertension 01/05/2009  . Coronary atherosclerosis 01/05/2009    Past Surgical History: Past Surgical History:  Procedure Laterality Date  . BOTOX INJECTION  11/20/2017   Procedure: BOTOX INJECTION;  Surgeon: Doran Stabler, MD;  Location: [facility];  Service: Gastroenterology;;  . CATARACT EXTRACTION W/ INTRAOCULAR LENS  IMPLANT, BILATERAL Bilateral   . CORONARY ANGIOPLASTY WITH STENT PLACEMENT  2004   "LAD & one of the circumflex"   . CORONARY ARTERY BYPASS GRAFT  2011   CABG X4  . DIALYSIS/PERMA CATHETER INSERTION N/A 03/05/2018   Procedure: DIALYSIS/PERMA CATHETER INSERTION;  Surgeon: Algernon Huxley, MD;  Location: [facility];  Service: Cardiovascular;  Laterality: N/A;  . ESOPHAGOGASTRODUODENOSCOPY (EGD) WITH PROPOFOL N/A 11/20/2017   Procedure: ESOPHAGOGASTRODUODENOSCOPY (EGD) WITH PROPOFOL;  Surgeon: Doran Stabler, MD;  Location: [facility];  Service: Gastroenterology;  Laterality: N/A;  . IR GASTROSTOMY TUBE MOD SED  12/10/2017    Allergies: Allergies  Allergen Reactions  . Reglan [Metoclopramide]     Pt's son reports this causes patient tremors and neurologic issues   . Contrast Media [Iodinated Diagnostic Agents]     Sensitivity, kidney issues    Home Medications: Medications Prior to Admission  Medication Sig Dispense Refill Last Dose  . acetaminophen (TYLENOL) 160 MG/5ML solution Place 15.6 mLs (500 mg total) into feeding tube every 6 (six) hours as needed for mild pain or fever.   PRN at PRN  . albuterol (PROVENTIL) (2.5 MG/3ML) 0.083% nebulizer solution Take 3 mLs (2.5 mg total) by nebulization every 4 (four) hours as needed for wheezing or shortness of breath.   PRN at PRN  . amLODipine (NORVASC) 10 MG tablet Take 10 mg by mouth daily.   unknown at unknown  . clopidogrel (PLAVIX) 75 MG tablet Place 1 tablet (75 mg total) into feeding tube daily.   UNKNOWN at UNKNOWN  . glipiZIDE (GLUCOTROL XL) 10 MG 24 hr tablet Take 10 mg by mouth daily with breakfast.   unknown at unknown  . isosorbide mononitrate (IMDUR) 30 MG 24 hr tablet Take 30 mg by mouth daily.   UNKNOWN at UNKNOWN  . Melatonin 3 MG TABS Take 1 tablet (3 mg total) by mouth at bedtime as needed (insomnia).  0 PRN at PRN  . metoprolol tartrate (LOPRESSOR) 50 MG tablet Take 50 mg by mouth 2 (two) times daily.   unknown at unknown  . nitroGLYCERIN (NITROSTAT) 0.4 MG SL tablet Place 1 tablet (0.4 mg total) under the tongue every 5 (five)  minutes x 3 doses as needed for chest pain.   PRN at PRN  . ondansetron (ZOFRAN) 4 MG/2ML SOLN injection Inject 2 mLs (4 mg total) into the vein every 8 (eight) hours as needed for nausea or vomiting (nausea, vomiting).   PRN at PRN  . atorvastatin (LIPITOR) 40 MG tablet Place 1 tablet (40 mg total) into feeding tube daily at 6 PM.     . chlorhexidine (PERIDEX) 0.12 % solution 15 mLs by Mouth Rinse route 2 (two) times daily.     . cholecalciferol (VITAMIN D3) 10 MCG (400 UNIT) TABS tablet Place 1 tablet (400 Units total) into feeding tube daily.     . collagenase (SANTYL) ointment Apply topically daily.     . famotidine (PEPCID) 10 MG tablet Place 1 tablet (10 mg total) into feeding tube daily.     . ferrous sulfate 300 (60 Fe) MG/5ML syrup Place 5 mLs (300 mg total)  into feeding tube daily.     Marland Kitchen ipratropium (ATROVENT) 0.02 % nebulizer solution Take 2.5 mLs (0.5 mg total) by nebulization every 6 (six) hours as needed for wheezing or shortness of breath.     . mouth rinse LIQD solution 15 mLs by Mouth Rinse route 2 times daily at 12 noon and 4 pm.     . Multiple Vitamin (MULTIVITAMIN) LIQD Place 15 mLs into feeding tube daily.     . Nutritional Supplements (FEEDING SUPPLEMENT, NEPRO CARB STEADY,) LIQD Place 1,000 mLs into feeding tube daily.  0   . patiromer (VELTASSA) 8.4 g packet Take 1 packet (8.4 g total) by mouth daily.     . promethazine (PHENERGAN) 25 MG/ML injection Inject 0.25 mLs (6.25 mg total) into the vein every 6 (six) hours as needed for nausea or vomiting.     . Water For Irrigation, Sterile (FREE WATER) SOLN Place 30 mLs into feeding tube every 8 (eight) hours.      Home medication reconciliation was completed with the patient.   Scheduled Inpatient Medications:   . atorvastatin  40 mg Per Tube q1800  . B-complex with vitamin C  1 tablet Per Tube Daily  . chlorhexidine  15 mL Mouth Rinse BID  . clopidogrel  75 mg Per Tube QHS  . collagenase   Topical Daily  . famotidine  10  mg Per Tube Daily  . feeding supplement (PRO-STAT SUGAR FREE 64)  30 mL Per Tube BID  . free water  20 mL Per Tube Q4H  . insulin aspart  0-15 Units Subcutaneous Q4H  . mouth rinse  15 mL Mouth Rinse q12n4p  . multivitamin  15 mL Per Tube QHS  . nutrition supplement (JUVEN)  1 packet Per Tube BID BM  . pantoprazole (PROTONIX) IV  40 mg Intravenous Daily  . polyethylene glycol  17 g Oral Daily    Continuous Inpatient Infusions:   . albumin human Stopped (03/03/18 1340)  . ampicillin-sulbactam (UNASYN) IV Stopped (03/07/18 1331)  . dextrose 5 % and 0.9% NaCl 30 mL/hr at 03/07/18 1600  . feeding supplement (OSMOLITE 1.5 CAL) 1,000 mL (03/07/18 1636)    PRN Inpatient Medications:  acetaminophen (TYLENOL) oral liquid 160 mg/5 mL **OR** acetaminophen **OR** acetaminophen, albuterol, ipratropium, lip balm, Melatonin, nitroGLYCERIN, ondansetron  Family History: family history includes Diabetes (age of onset: 62) in her father; Unexplained death (age of onset: 39) in her mother.   GI Family History: Negative.  Social History:   reports that she has never smoked. She has never used smokeless tobacco. She reports that she does not drink alcohol or use drugs. The patient denies ETOH, tobacco, or drug use.    Review of Systems: Review of Systems - Unobtainable due to patient dementia.  Physical Examination: BP 120/79   Pulse 79   Temp (!) 97.5 F (36.4 C) (Axillary)   Resp 10   Ht 5\' 3"  (1.6 m)   Wt 73.7 kg   SpO2 100%   BMI 28.78 kg/m  Physical Exam Vitals signs reviewed.  Constitutional:      General: She is not in acute distress.    Comments: Pleasantly confused  HENT:     Head: Normocephalic and atraumatic.     Nose: Nose normal.  Eyes:     Conjunctiva/sclera: Conjunctivae normal.     Pupils: Pupils are equal, round, and reactive to light.  Cardiovascular:     Rate and Rhythm: Normal rate.     Pulses: Normal pulses.  Pulmonary:     Effort: Pulmonary effort is normal.      Breath sounds: Normal breath sounds.  Abdominal:     General: Abdomen is flat. There is no distension.     Palpations: There is no mass.     Comments: PEG tube flushed with 50cc of water by ICU RN. Tube flushed without difficulty. PEG tube flushed with TF formula and flushed without resistance or leakage of feeding around the PEG site.  Musculoskeletal:        General: No swelling.  Skin:    General: Skin is warm and dry.  Neurological:     Mental Status: She is alert.  Psychiatric:        Mood and Affect: Mood normal.     Data: Lab Results  Component Value Date   WBC 8.6 03/07/2018   HGB 7.9 (L) 03/07/2018   HCT 26.5 (L) 03/07/2018   MCV 100.8 (H) 03/07/2018   PLT 164 03/07/2018   Recent Labs  Lab 03/05/18 0445 03/06/18 1625 03/07/18 0425  HGB 7.7* 7.8* 7.9*   Lab Results  Component Value Date   NA 140 03/07/2018   K 3.6 03/07/2018   CL 107 03/07/2018   CO2 27 03/07/2018   BUN 58 (H) 03/07/2018   CREATININE 2.11 (H) 03/07/2018   Lab Results  Component Value Date   ALT 298 (H) 03/07/2018   AST 248 (H) 03/07/2018   ALKPHOS 144 (H) 03/07/2018   BILITOT 0.8 03/07/2018   No results for input(s): APTT, INR, PTT in the last 168 hours. CBC Latest Ref Rng & Units 03/07/2018 03/06/2018 03/05/2018  WBC 4.0 - 10.5 K/uL 8.6 8.0 9.6  Hemoglobin 12.0 - 15.0 g/dL 7.9(L) 7.8(L) 7.7(L)  Hematocrit 36.0 - 46.0 % 26.5(L) 26.6(L) 25.9(L)  Platelets 150 - 400 K/uL 164 177 222    STUDIES: Dg Chest 1 View  Result Date: 03/06/2018 CLINICAL DATA:  Shortness of breath.  New PermCath. EXAM: CHEST  1 VIEW COMPARISON:  03/02/2018 FINDINGS: The right IJ PermCath tips are in good position the distal SVC and right atrium. No complicating features. A right-sided PICC line is also in place with its tip near the subclavian jugular junction. The heart is enlarged but stable. There is central vascular congestion, bilateral pleural effusions and bibasilar atelectasis or infiltrates. No overt  pulmonary edema. IMPRESSION: Right IJ PermCath in good position without complicating features. The PICC line is also stable. Cardiac enlargement, pleural effusions and bibasilar infiltrates or atelectasis. Moderate central vascular congestion without overt pulmonary edema. Electronically Signed   By: Marijo Sanes M.D.   On: 03/06/2018 16:32   Dg Abd 1 View  Result Date: 03/06/2018 CLINICAL DATA:  Gastrostomy tube obstruction. EXAM: ABDOMEN - 1 VIEW COMPARISON:  KUB 03/06/2018 and 02/27/2018.  Chest x-ray 03/02/2018. FINDINGS: Right PICC line in stable position. Right IJ large caliber central line noted with tip over right atrium. Prior CABG. Cardiomegaly. Bibasilar infiltrates/edema and bilateral pleural effusions again noted. Findings suggest CHF. Bibasilar atelectasis. No pneumothorax. Gastrostomy tube noted. Contrast noted in the stomach, duodenum, and proximal small bowel. IMPRESSION: 1. Right PICC line stable position. Right IJ large caliber central line noted with tip over right atrium. Prior CABG. Cardiomegaly. Bibasilar infiltrates/edema and bilateral pleural effusions. Findings again suggest CHF. Bibasilar atelectasis. 2. Gastrostomy tube noted over the stomach. Contrast noted within the stomach, duodenum, and proximal small bowel. Electronically Signed   By: Marcello Moores  Register   On: 03/06/2018 15:26   Dg Abd 1 View  Result Date: 03/06/2018 CLINICAL DATA:  Gastrostomy catheter  dysfunction EXAM: ABDOMEN - 1 VIEW COMPARISON:  02/27/2018 FINDINGS: Gastrostomy catheter is noted adjacent to the T tacks in the anterior abdominal wall stable from the previous exam. Right femoral dialysis catheter is again noted as well as a jugular placed dialysis catheter in the right atrium. IMPRESSION: Stable appearance of gastrostomy catheter. Contrast injection would be required to assess for internal patency. Electronically Signed   By: Inez Catalina M.D.   On: 03/06/2018 11:21   Dg Chest Port 1 View  Result Date:  03/07/2018 CLINICAL DATA:  Respiratory failure EXAM: PORTABLE CHEST 1 VIEW COMPARISON:  March 06, 2018 FINDINGS: Dual lumen catheter tip is at the cavoatrial junction. Right single-lumen central catheter has its tip in the right innominate vein near the junction with the superior vena cava. There is an apparent skin fold on the left. No pneumothorax is demonstrable. There is cardiomegaly with mild pulmonary venous hypertension. There are pleural effusions bilaterally with mild interstitial edema. There is subtle opacity in the right upper lobe which may represent either alveolar edema or a small area of pneumonitis. There is an old healed fracture of the proximal right humerus. IMPRESSION: Catheter positions unchanged. No evident pneumothorax. There is a skin fold on the left. There is pulmonary vascular congestion with pleural effusions bilaterally and mild interstitial edema. Question mild alveolar edema versus small focus of pneumonia right upper lobe. Electronically Signed   By: Lowella Grip III M.D.   On: 03/07/2018 07:15   @IMAGES @  Assessment: 1. "Dysfunctional" PEG tube -Tube was assessed and flushed. Tube is functioning without need for interventions.   Recommendations: 1. May use PEG tube for flushes, meds and feeding as prescribed. 2. Call back if I can help.   Thank you for the consult. Please call with questions or concerns.  Olean Ree, "Lanny Hurst MD [facility] [place], [place] [address] [phone]  03/07/2018 4:48 PM

## 2018-03-07 NOTE — Progress Notes (Signed)
[place], [place] 03/07/18  Subjective:  Patient status worse today. She was moved over to the critical care unit. Still remains lethargic. No urine output noted.  Objective:  Vital signs in last 24 hours:  Temp:  [97 F (36.1 C)-97.7 F (36.5 C)] 97.4 F (36.3 C) (02/05 0800) Pulse Rate:  [59-88] 78 (02/05 0800) Resp:  [8-20] 8 (02/05 0800) BP: (72-122)/(25-76) 111/52 (02/05 0800) SpO2:  [90 %-100 %] 100 % (02/05 0800) Weight:  [73.7 kg] 73.7 kg (02/05 0500)  Weight change: -8.1 kg Filed Weights   03/06/18 0552 03/06/18 2000 03/07/18 0500  Weight: 79.4 kg 73.7 kg 73.7 kg    Intake/Output:    Intake/Output Summary (Last 24 hours) at 03/07/2018 1037 Last data filed at 03/06/2018 1821 Gross per 24 hour  Intake 766.89 ml  Output 0 ml  Net 766.89 ml     Physical Exam: General:  Critically ill-appearing  HEENT  dry oral mucous membranes  Neck  supple  Pulm/lungs  Scattered rhonchi, normal effort  CVS/Heart  no rub, irregular  Abdomen:   Soft, nontender, nondistended  Extremities:  2+ pitting edema   Neurologic:  Lethargic difficult to arouse  Skin:  No acute rashes  Access:  Right IJ permcath        Basic Metabolic Panel:  Recent Labs  Lab 03/01/18 0418 03/02/18 0617 03/03/18 0548 03/04/18 0812 03/05/18 0445 03/06/18 1308 03/07/18 0425  NA 133* 133* 134* 137 140  --  140  K 3.7 4.0 3.9 3.8 4.3  --  3.6  CL 98 98 100 101 103  --  107  CO2 27 24 25 30 29   --  27  GLUCOSE 165* 256* 121* 171* 214*  --  106*  BUN 93* 73* 55* 46* 66*  --  58*  CREATININE 3.32* 2.76* 2.17* 1.94* 2.38*  --  2.11*  CALCIUM 8.0* 8.0* 8.3* 8.6* 9.1  --  8.1*  MG 2.2  --   --   --   --   --   --   PHOS 4.0  --   --   --   --  3.0  --      CBC: Recent Labs  Lab 03/03/18 0548 03/04/18 0812 03/05/18 0445 03/06/18 1625 03/07/18 0425  WBC 7.3 8.3 9.6 8.0 8.6  HGB 7.9* 7.7* 7.7* 7.8* 7.9*  HCT 26.2* 25.6* 25.9* 26.6* 26.5*  MCV 99.2 100.0  99.6 101.5* 100.8*  PLT 230 227 222 177 164      Lab Results  Component Value Date   HEPBSAG Negative 02/26/2018   HEPBSAB Non Reactive 02/26/2018   HEPBIGM Negative 02/26/2018      Microbiology:  Recent Results (from the past 240 hour(s))  MRSA PCR Screening     Status: Abnormal   Collection Time: 02/26/18  5:39 PM  Result Value Ref Range Status   MRSA by PCR POSITIVE (A) NEGATIVE Final    Comment:        The GeneXpert MRSA Assay (FDA approved for NASAL specimens only), is one component of a comprehensive MRSA colonization surveillance program. It is not intended to diagnose MRSA infection nor to guide or monitor treatment for MRSA infections. CRITICAL RESULT CALLED TO, READ BACK BY AND VERIFIED WITH:  CALLED TO DANA DUGGINS RN @1920  Performed at [facility], [address] [place], [place] [address]   CULTURE, BLOOD (ROUTINE X 2) w Reflex to ID Panel     Status: None (Preliminary result)  Collection Time: 03/06/18  9:01 PM  Result Value Ref Range Status   Specimen Description BLOOD LEFT ANTECUBITAL  Final   Special Requests   Final    BOTTLES DRAWN AEROBIC ONLY Blood Culture adequate volume   Culture   Final    NO GROWTH < 12 HOURS Performed at [facility], [address] [place], [place] [address]    Report Status PENDING  Incomplete  CULTURE, BLOOD (ROUTINE X 2) w Reflex to ID Panel     Status: None (Preliminary result)   Collection Time: 03/06/18  9:37 PM  Result Value Ref Range Status   Specimen Description BLOOD LEFT ANTECUBITAL  Final   Special Requests   Final    BOTTLES DRAWN AEROBIC AND ANAEROBIC Blood Culture adequate volume   Culture   Final    NO GROWTH < 12 HOURS Performed at [facility], [address] [place],  [address]    Report Status PENDING  Incomplete    Coagulation Studies: No results for input(s): LABPROT, INR in the last 72 hours.  Urinalysis: No results for input(s): COLORURINE,  LABSPEC, PHURINE, GLUCOSEU, HGBUR, BILIRUBINUR, KETONESUR, PROTEINUR, UROBILINOGEN, NITRITE, LEUKOCYTESUR in the last 72 hours.  Invalid input(s): APPERANCEUR    Imaging: Dg Chest 1 View  Result Date: 03/06/2018 CLINICAL DATA:  Shortness of breath.  New PermCath. EXAM: CHEST  1 VIEW COMPARISON:  03/02/2018 FINDINGS: The right IJ PermCath tips are in good position the distal SVC and right atrium. No complicating features. A right-sided PICC line is also in place with its tip near the subclavian jugular junction. The heart is enlarged but stable. There is central vascular congestion, bilateral pleural effusions and bibasilar atelectasis or infiltrates. No overt pulmonary edema. IMPRESSION: Right IJ PermCath in good position without complicating features. The PICC line is also stable. Cardiac enlargement, pleural effusions and bibasilar infiltrates or atelectasis. Moderate central vascular congestion without overt pulmonary edema. Electronically Signed   By: Marijo Sanes M.D.   On: 03/06/2018 16:32   Dg Abd 1 View  Result Date: 03/06/2018 CLINICAL DATA:  Gastrostomy tube obstruction. EXAM: ABDOMEN - 1 VIEW COMPARISON:  KUB 03/06/2018 and 02/27/2018.  Chest x-ray 03/02/2018. FINDINGS: Right PICC line in stable position. Right IJ large caliber central line noted with tip over right atrium. Prior CABG. Cardiomegaly. Bibasilar infiltrates/edema and bilateral pleural effusions again noted. Findings suggest CHF. Bibasilar atelectasis. No pneumothorax. Gastrostomy tube noted. Contrast noted in the stomach, duodenum, and proximal small bowel. IMPRESSION: 1. Right PICC line stable position. Right IJ large caliber central line noted with tip over right atrium. Prior CABG. Cardiomegaly. Bibasilar infiltrates/edema and bilateral pleural effusions. Findings again suggest CHF. Bibasilar atelectasis. 2. Gastrostomy tube noted over the stomach. Contrast noted within the stomach, duodenum, and proximal small bowel.  Electronically Signed   By: Marcello Moores  Register   On: 03/06/2018 15:26   Dg Abd 1 View  Result Date: 03/06/2018 CLINICAL DATA:  Gastrostomy catheter  dysfunction EXAM: ABDOMEN - 1 VIEW COMPARISON:  02/27/2018 FINDINGS: Gastrostomy catheter is noted adjacent to the T tacks in the anterior abdominal wall stable from the previous exam. Right femoral dialysis catheter is again noted as well as a jugular placed dialysis catheter in the right atrium. IMPRESSION: Stable appearance of gastrostomy catheter. Contrast injection would be required to assess for internal patency. Electronically Signed   By: Inez Catalina M.D.   On: 03/06/2018 11:21   Dg Chest Port 1 View  Result Date: 03/07/2018 CLINICAL DATA:  Respiratory  failure EXAM: PORTABLE CHEST 1 VIEW COMPARISON:  March 06, 2018 FINDINGS: Dual lumen catheter tip is at the cavoatrial junction. Right single-lumen central catheter has its tip in the right innominate vein near the junction with the superior vena cava. There is an apparent skin fold on the left. No pneumothorax is demonstrable. There is cardiomegaly with mild pulmonary venous hypertension. There are pleural effusions bilaterally with mild interstitial edema. There is subtle opacity in the right upper lobe which may represent either alveolar edema or a small area of pneumonitis. There is an old healed fracture of the proximal right humerus. IMPRESSION: Catheter positions unchanged. No evident pneumothorax. There is a skin fold on the left. There is pulmonary vascular congestion with pleural effusions bilaterally and mild interstitial edema. Question mild alveolar edema versus small focus of pneumonia right upper lobe. Electronically Signed   By: Lowella Grip III M.D.   On: 03/07/2018 07:15     Medications:   . albumin human Stopped (03/03/18 1340)  . ampicillin-sulbactam (UNASYN) IV 3 g (03/07/18 0106)  . feeding supplement (OSMOLITE 1.5 CAL) 1,000 mL (03/05/18 2221)   . atorvastatin  40 mg  Per Tube q1800  . B-complex with vitamin C  1 tablet Per Tube Daily  . chlorhexidine  15 mL Mouth Rinse BID  . clopidogrel  75 mg Per Tube QHS  . collagenase   Topical Daily  . famotidine  10 mg Per Tube Daily  . feeding supplement (PRO-STAT SUGAR FREE 64)  30 mL Per Tube BID  . free water  20 mL Per Tube Q4H  . insulin aspart  0-15 Units Subcutaneous Q4H  . mouth rinse  15 mL Mouth Rinse q12n4p  . multivitamin  15 mL Per Tube QHS  . nutrition supplement (JUVEN)  1 packet Per Tube BID BM  . pantoprazole sodium  40 mg Per Tube Daily  . polyethylene glycol  17 g Oral Daily   acetaminophen (TYLENOL) oral liquid 160 mg/5 mL **OR** acetaminophen **OR** acetaminophen, albuterol, ipratropium, lip balm, Melatonin, nitroGLYCERIN, ondansetron, promethazine  Assessment/ Plan:  85 y.o. African-American female with multiple comorbidities including diabetes type 2, hypertension, hyperlipidemia, coronary disease status post CABG, history of stroke, CKD, was admitted on 02/26/2018. Originally admitted to [facility] [place] with Achalasia and 50 pound weight loss, dysarthria  In NOV 2019  1.  Acute kidney injury on chronic kidney disease stage III.  Baseline creatinine probably 1.1 from Nov 2019 2.  Malnutrition causing weight loss, now has G-tube 3.  Generalized edema and pulmonary edema 4.  Chronic systolic congestive heart failure EF 30%.  CAD status post CABG 5.  Stroke with left-sided weakness 6.  Uremia, likely causing altered mental status 7.  Enlarging lymph node in umblical hernia  8.  Anemia of CKD. 9.  Hypotension.    Plan:  Patient was too unstable for dialysis yesterday.  Patient remains lethargic.  Blood pressure currently 111/52.  Her azotemia has significantly improved with a BUN of 58 and creatinine of 2.1.  However no significant urine output noted.  Hold off on dialysis for now as her blood pressure remains relatively low.  Reevaluate for treatment tomorrow.  LOS: 9 Idamae Coccia 2/5/202010:37 AM  [place], [place]  Note: This note was prepared with Dragon dictation. Any transcription errors are unintentional

## 2018-03-07 NOTE — Progress Notes (Signed)
Patient was a rapid response from [facility] due to hypotension and lethargy. On arrival patient was still lethargic but VS were WNL. Patient remained stable all shift and became more alert through out the night. Son, Juanda Crumble remained at bedside all night and was updated by NP regarding patient status.

## 2018-03-07 NOTE — Progress Notes (Signed)
[place] at [place] NAME: [name]    MR#:  [id number]  DATE OF BIRTH:  09/18/1927  SUBJECTIVE:   Patient was transferred yesterday in the ICU given low blood pressure. Blood pressure at present is stable. She is currently not on any vasopressin drip. She is very lethargic opens eyes to verbal command however does not answer any questions.  Tube feeding on hold due to issues with flushing. G.I. consult pending. No family in the room. REVIEW OF SYSTEMS:   Review of Systems  Unable to perform ROS: Mental status change     DRUG ALLERGIES:   Allergies  Allergen Reactions  . Reglan [Metoclopramide]     Pt's son reports this causes patient tremors and neurologic issues   . Contrast Media [Iodinated Diagnostic Agents]     Sensitivity, kidney issues    VITALS:  Blood pressure 114/60, pulse 85, temperature (!) 97.5 F (36.4 C), temperature source Axillary, resp. rate (!) 8, height 5\' 3"  (1.6 m), weight 73.7 kg, SpO2 100 %.  PHYSICAL EXAMINATION:   Physical Exam limited exam secondary to patient big lethargic  GENERAL:  85 y.o.-year-old patient lying in the bed with no acute distress. Chronically ill. Anasarca EYES: Pupils equal, round, reactive to light and accommodation. No scleral icterus. Extraocular muscles intact.  HEENT: Head atraumatic, normocephalic. Oropharynx and nasopharynx clear.  NECK:  Supple, no jugular venous distention. No thyroid enlargement, no tenderness.  LUNGS: Normal breath sounds bilaterally, no wheezing, rales, rhonchi. No use of accessory muscles of respiration.  CARDIOVASCULAR: S1, S2 normal. No murmurs, rubs, or gallops.  ABDOMEN: Soft, nontender, nondistended. Bowel sounds present. No organomegaly or mass. PEG site ok EXTREMITIES: No cyanosis, clubbing or edema b/l.    NEUROLOGIC: very limited. Patient is lethargic. Does not follow any commands. PSYCHIATRIC:  patient is lethargic opens eyes to  verbal commandsr. Not communicating today  LABORATORY PANEL:  CBC Recent Labs  Lab 03/07/18 0425  WBC 8.6  HGB 7.9*  HCT 26.5*  PLT 164    Chemistries  Recent Labs  Lab 03/01/18 0418  03/07/18 0425  NA 133*   < > 140  K 3.7   < > 3.6  CL 98   < > 107  CO2 27   < > 27  GLUCOSE 165*   < > 106*  BUN 93*   < > 58*  CREATININE 3.32*   < > 2.11*  CALCIUM 8.0*   < > 8.1*  MG 2.2  --   --   AST  --    < > 248*  ALT  --    < > 298*  ALKPHOS  --    < > 144*  BILITOT  --    < > 0.8   < > = values in this interval not displayed.   Cardiac Enzymes No results for input(s): TROPONINI in the last 168 hours. RADIOLOGY:  Dg Chest 1 View  Result Date: 03/06/2018 CLINICAL DATA:  Shortness of breath.  New PermCath. EXAM: CHEST  1 VIEW COMPARISON:  03/02/2018 FINDINGS: The right IJ PermCath tips are in good position the distal SVC and right atrium. No complicating features. A right-sided PICC line is also in place with its tip near the subclavian jugular junction. The heart is enlarged but stable. There is central vascular congestion, bilateral pleural effusions and bibasilar atelectasis or infiltrates. No overt pulmonary edema. IMPRESSION: Right IJ PermCath in good position without complicating features. The  PICC line is also stable. Cardiac enlargement, pleural effusions and bibasilar infiltrates or atelectasis. Moderate central vascular congestion without overt pulmonary edema. Electronically Signed   By: Marijo Sanes M.D.   On: 03/06/2018 16:32   Dg Abd 1 View  Result Date: 03/06/2018 CLINICAL DATA:  Gastrostomy tube obstruction. EXAM: ABDOMEN - 1 VIEW COMPARISON:  KUB 03/06/2018 and 02/27/2018.  Chest x-ray 03/02/2018. FINDINGS: Right PICC line in stable position. Right IJ large caliber central line noted with tip over right atrium. Prior CABG. Cardiomegaly. Bibasilar infiltrates/edema and bilateral pleural effusions again noted. Findings suggest CHF. Bibasilar atelectasis. No pneumothorax.  Gastrostomy tube noted. Contrast noted in the stomach, duodenum, and proximal small bowel. IMPRESSION: 1. Right PICC line stable position. Right IJ large caliber central line noted with tip over right atrium. Prior CABG. Cardiomegaly. Bibasilar infiltrates/edema and bilateral pleural effusions. Findings again suggest CHF. Bibasilar atelectasis. 2. Gastrostomy tube noted over the stomach. Contrast noted within the stomach, duodenum, and proximal small bowel. Electronically Signed   By: Marcello Moores  Register   On: 03/06/2018 15:26   Dg Abd 1 View  Result Date: 03/06/2018 CLINICAL DATA:  Gastrostomy catheter  dysfunction EXAM: ABDOMEN - 1 VIEW COMPARISON:  02/27/2018 FINDINGS: Gastrostomy catheter is noted adjacent to the T tacks in the anterior abdominal wall stable from the previous exam. Right femoral dialysis catheter is again noted as well as a jugular placed dialysis catheter in the right atrium. IMPRESSION: Stable appearance of gastrostomy catheter. Contrast injection would be required to assess for internal patency. Electronically Signed   By: Inez Catalina M.D.   On: 03/06/2018 11:21   Dg Chest Port 1 View  Result Date: 03/07/2018 CLINICAL DATA:  Respiratory failure EXAM: PORTABLE CHEST 1 VIEW COMPARISON:  March 06, 2018 FINDINGS: Dual lumen catheter tip is at the cavoatrial junction. Right single-lumen central catheter has its tip in the right innominate vein near the junction with the superior vena cava. There is an apparent skin fold on the left. No pneumothorax is demonstrable. There is cardiomegaly with mild pulmonary venous hypertension. There are pleural effusions bilaterally with mild interstitial edema. There is subtle opacity in the right upper lobe which may represent either alveolar edema or a small area of pneumonitis. There is an old healed fracture of the proximal right humerus. IMPRESSION: Catheter positions unchanged. No evident pneumothorax. There is a skin fold on the left. There is  pulmonary vascular congestion with pleural effusions bilaterally and mild interstitial edema. Question mild alveolar edema versus small focus of pneumonia right upper lobe. Electronically Signed   By: Lowella Grip III M.D.   On: 03/07/2018 07:15   ASSESSMENT AND PLAN:  Ewelina Naves is an22 y.o.femalewith multiple comorbidities including diabetes type 2, hypertension, hyperlipidemia, coronary disease status post CABG, history of stroke, and CKD who was admitted on1/27/2020. [facility] [place] [facility] achal[facility]and 50 pound weight loss in 12/2017.Status post treatment for ESBL Klebsiella aspiration pneumonia, enterococcus UTI. Admitted for dialysis.  1. Acute kidney injury on chronic kidney disease stage III. Uremia, and multiple other comorbidities likely causing altered mental status/acute metabolic encephalopathy - complicated by CHF, poor nutrition, cardio renal syndrome.Son and daughter were persistent and would like to give trial of dialysis to patient. They do understand the risk and complications involved in the process of dialysis. - Nephrologyfollowing; patient receiveddialysis treatment almost daily.Urine output low. Willcontinue tomonitor patient's ability to tolerate due to her frail status and critical illness -patient did not get dialysis yesterday due to low  blood pressure. Transferred to the ICU. - vascular accessin place for HD -Dr. Candiss Norse has also given albumin injection during dialysis 03/02/18 - s/p permacath.  2.Acute on chronic mild systolic, diastolic congestive heart failure with moderate MR and bilateral pleural effusion with and Asarca and hypoalbuminemia, pending improvement with HD - cardiac meds previously held at family request for hypotension -Holding isosorbide-hydralazine, patient not hypertensive on single agent at this time with low diastolic pressures - small dose metoprolol--Will hold again due to  hypotension.  3.Dysphagia secondary to achalasia. Patient failed Botox and status post peg tube feeding - patient has been having symptoms of vomiting. She has been getting tube feeding at 20 mL an hour. -recs forTube feeding per nutrition, in place per GI c/s - daughter requested 03/02/2018 to stop all tube feeds for concern of aspiration on emesis --- G.I. be consulted. Dr. Ricky Stabs outpatient today and peg tube seems to be flushed well and working.  4.Type II diabetes -sliding scale insulin  5.History of left temporal and frontal punctate CVA -on Plavix and Lipitor  6.Status post non-STEMI -- continue Plavix and statins  7. Lymph node within umbilical hernia, son declined core biopsyaccording to [place] clinicalnotesGeneral [facility] no further recommendations at this time.-- Son has declined further workup according to [facility] cone notes  8. Deconditioning, frailty  - PTtoevaluateand treatment ordered, as tolerated [place] need to sit in chair for Out pt HD>  9. Hypothermia Resolved. Bear hugger as needed  10. Hypotension    Episodes today noticed before HD.    CODE STATUS: full  DVT Prophylaxis: family has declined to use heparin  TOTAL TIME TAKING CARE OF THIS PATIENT: 30 minutes.  >50% time spent on counselling and coordination of care  Note: This dictation was prepared with Dragon dictation along with smaller phrase technology. Any transcriptional errors that result from this process are unintentional.  Fritzi Mandes M.D on 03/07/2018 at 6:52 PM  Between 7am to 6pm - Pager - [phone]  After 6pm go to [email or web address] - password [email or web address] [facility]  Office  [phone]  CC: Primary care physician; Deland Pretty, MDPatient ID: Verlon Au, female   DOB: [birth year], 85 y.o.   MRN: [id number]

## 2018-03-08 ENCOUNTER — Inpatient Hospital Stay
Admission: RE | Admit: 2018-03-08 | Discharge: 2018-03-08 | Disposition: A | Discharge status: Home / Self Care | Payer: Medicare Other | Source: Ambulatory Visit | Attending: Pulmonary Disease | Admitting: Pulmonary Disease

## 2018-03-08 ENCOUNTER — Inpatient Hospital Stay: Payer: Medicare Other

## 2018-03-08 DIAGNOSIS — I480 Paroxysmal atrial fibrillation: Secondary | ICD-10-CM

## 2018-03-08 LAB — GLUCOSE, CAPILLARY
Glucose-Capillary: 228 mg/dL — ABNORMAL HIGH (ref 70–99)
Glucose-Capillary: 158 mg/dL — ABNORMAL HIGH (ref 70–99)
Glucose-Capillary: 164 mg/dL — ABNORMAL HIGH (ref 70–99)
Glucose-Capillary: 110 mg/dL — ABNORMAL HIGH (ref 70–99)

## 2018-03-08 LAB — BASIC METABOLIC PANEL
Anion gap: 10 (ref 5–15)
BUN: 68 mg/dL — AB (ref 8–23)
CO2: 27 mmol/L (ref 22–32)
Calcium: 8.7 mg/dL — ABNORMAL LOW (ref 8.9–10.3)
Chloride: 101 mmol/L (ref 98–111)
Creatinine, Ser: 2.77 mg/dL — ABNORMAL HIGH (ref 0.44–1.00)
GFR calc Af Amer: 17 mL/min — ABNORMAL LOW (ref >60)
GFR calc non Af Amer: 15 mL/min — ABNORMAL LOW (ref >60)
Glucose, Bld: 258 mg/dL — ABNORMAL HIGH (ref 70–99)
POTASSIUM: 3.8 mmol/L (ref 3.5–5.1)
Sodium: 138 mmol/L (ref 135–145)

## 2018-03-08 LAB — PHOSPHORUS: Phosphorus: 3.2 mg/dL (ref 2.5–4.6)

## 2018-03-08 LAB — CBC
HCT: 27.5 % — ABNORMAL LOW (ref 36.0–46.0)
Hemoglobin: 8.1 g/dL — ABNORMAL LOW (ref 12.0–15.0)
MCH: 30 pg (ref 26.0–34.0)
MCHC: 29.5 g/dL — ABNORMAL LOW (ref 30.0–36.0)
MCV: 101.9 fL — ABNORMAL HIGH (ref 80.0–100.0)
Platelets: 158 10*3/uL (ref 150–400)
RBC: 2.7 MIL/uL — AB (ref 3.87–5.11)
RDW: 17 % — ABNORMAL HIGH (ref 11.5–15.5)
WBC: 7.6 10*3/uL (ref 4.0–10.5)
nRBC: 0.8 % — ABNORMAL HIGH (ref 0.0–0.2)

## 2018-03-08 LAB — ECHOCARDIOGRAM COMPLETE
Height: 63 in
Weight: 2613.77 oz

## 2018-03-08 LAB — TROPONIN I
Troponin I: 0.92 ng/mL (ref <0.03)
Troponin I: 0.94 ng/mL (ref <0.03)
Troponin I: 1 ng/mL (ref <0.03)
Troponin I: 1.07 ng/mL (ref <0.03)

## 2018-03-08 LAB — BRAIN NATRIURETIC PEPTIDE: B Natriuretic Peptide: >4500 pg/mL — ABNORMAL HIGH (ref 0.0–100.0)

## 2018-03-08 LAB — PROCALCITONIN: Procalcitonin: 1.61 ng/mL

## 2018-03-08 MED ORDER — PROMETHAZINE HCL 25 MG/ML IJ SOLN
6.2500 mg | Freq: Four times a day (QID) | INTRAMUSCULAR | Status: DC | PRN
  Administered 2018-03-08 – 2018-03-16 (×10): 6.25 mg via INTRAVENOUS
  Filled 2018-03-08 (×10): qty 1

## 2018-03-08 MED ORDER — ALBUMIN HUMAN 25 % IV SOLN
25.0000 g | INTRAVENOUS | Status: DC | PRN
  Administered 2018-03-08 – 2018-04-02 (×5): 25 g via INTRAVENOUS
  Filled 2018-03-08 (×7): qty 100

## 2018-03-08 MED ORDER — METOPROLOL TARTRATE 5 MG/5ML IV SOLN
2.5000 mg | Freq: Once | INTRAVENOUS | Status: AC
  Administered 2018-03-08: 2.5 mg via INTRAVENOUS

## 2018-03-08 MED ORDER — INSULIN ASPART 100 UNIT/ML ~~LOC~~ SOLN
0.0000 [IU] | SUBCUTANEOUS | Status: DC
  Administered 2018-03-09: 1 [IU] via SUBCUTANEOUS
  Administered 2018-03-09 (×2): 2 [IU] via SUBCUTANEOUS
  Administered 2018-03-09: 1 [IU] via SUBCUTANEOUS
  Administered 2018-03-09 – 2018-03-10 (×6): 2 [IU] via SUBCUTANEOUS
  Administered 2018-03-11 (×2): 1 [IU] via SUBCUTANEOUS
  Administered 2018-03-11: 5 [IU] via SUBCUTANEOUS
  Administered 2018-03-11 (×2): 2 [IU] via SUBCUTANEOUS
  Administered 2018-03-12 (×3): 1 [IU] via SUBCUTANEOUS
  Administered 2018-03-13 (×2): 2 [IU] via SUBCUTANEOUS
  Administered 2018-03-13: 3 [IU] via SUBCUTANEOUS
  Administered 2018-03-13 (×3): 2 [IU] via SUBCUTANEOUS
  Administered 2018-03-14: 1 [IU] via SUBCUTANEOUS
  Administered 2018-03-14: 7 [IU] via SUBCUTANEOUS
  Administered 2018-03-14: 2 [IU] via SUBCUTANEOUS
  Administered 2018-03-14: 5 [IU] via SUBCUTANEOUS
  Administered 2018-03-14: 3 [IU] via SUBCUTANEOUS
  Administered 2018-03-14: 5 [IU] via SUBCUTANEOUS
  Administered 2018-03-15 (×3): 3 [IU] via SUBCUTANEOUS
  Administered 2018-03-15: 5 [IU] via SUBCUTANEOUS
  Administered 2018-03-15 – 2018-03-16 (×2): 2 [IU] via SUBCUTANEOUS
  Administered 2018-03-17: 3 [IU] via SUBCUTANEOUS
  Administered 2018-03-17: 2 [IU] via SUBCUTANEOUS
  Administered 2018-03-17 – 2018-03-19 (×10): 3 [IU] via SUBCUTANEOUS
  Administered 2018-03-19: 2 [IU] via SUBCUTANEOUS
  Administered 2018-03-20: 5 [IU] via SUBCUTANEOUS
  Administered 2018-03-20: 3 [IU] via SUBCUTANEOUS
  Administered 2018-03-20: 5 [IU] via SUBCUTANEOUS
  Administered 2018-03-20 – 2018-03-21 (×5): 3 [IU] via SUBCUTANEOUS
  Administered 2018-03-21: 1 [IU] via SUBCUTANEOUS
  Administered 2018-03-23: 3 [IU] via SUBCUTANEOUS
  Administered 2018-03-23: 1 [IU] via SUBCUTANEOUS
  Administered 2018-03-23: 3 [IU] via SUBCUTANEOUS
  Administered 2018-03-24: 1 [IU] via SUBCUTANEOUS
  Administered 2018-03-24 (×3): 3 [IU] via SUBCUTANEOUS
  Administered 2018-03-24: 2 [IU] via SUBCUTANEOUS
  Administered 2018-03-24: 1 [IU] via SUBCUTANEOUS
  Administered 2018-03-24 – 2018-03-25 (×3): 3 [IU] via SUBCUTANEOUS
  Administered 2018-03-25: 2 [IU] via SUBCUTANEOUS
  Administered 2018-03-25 (×2): 3 [IU] via SUBCUTANEOUS
  Administered 2018-03-26: 2 [IU] via SUBCUTANEOUS
  Administered 2018-03-26: 3 [IU] via SUBCUTANEOUS
  Administered 2018-03-26 (×2): 5 [IU] via SUBCUTANEOUS
  Administered 2018-03-27 (×2): 3 [IU] via SUBCUTANEOUS
  Administered 2018-03-27: 2 [IU] via SUBCUTANEOUS
  Administered 2018-03-27: 5 [IU] via SUBCUTANEOUS
  Administered 2018-03-27 – 2018-03-28 (×4): 3 [IU] via SUBCUTANEOUS
  Administered 2018-03-28: 1 [IU] via SUBCUTANEOUS
  Administered 2018-03-28: 3 [IU] via SUBCUTANEOUS
  Administered 2018-03-28: 2 [IU] via SUBCUTANEOUS
  Administered 2018-03-29: 1 [IU] via SUBCUTANEOUS
  Administered 2018-03-29 (×2): 2 [IU] via SUBCUTANEOUS
  Administered 2018-03-29: 1 [IU] via SUBCUTANEOUS
  Administered 2018-03-30: 2 [IU] via SUBCUTANEOUS
  Administered 2018-03-31: 5 [IU] via SUBCUTANEOUS
  Administered 2018-03-31: 1 [IU] via SUBCUTANEOUS
  Administered 2018-03-31 – 2018-04-02 (×8): 3 [IU] via SUBCUTANEOUS
  Administered 2018-04-02: 5 [IU] via SUBCUTANEOUS
  Administered 2018-04-02: 2 [IU] via SUBCUTANEOUS
  Administered 2018-04-02: 7 [IU] via SUBCUTANEOUS
  Administered 2018-04-03: 2 [IU] via SUBCUTANEOUS
  Administered 2018-04-03: 5 [IU] via SUBCUTANEOUS
  Administered 2018-04-03: 3 [IU] via SUBCUTANEOUS
  Filled 2018-03-08 (×112): qty 1

## 2018-03-08 MED ORDER — METOPROLOL TARTRATE 5 MG/5ML IV SOLN
INTRAVENOUS | Status: AC
  Filled 2018-03-08: qty 5

## 2018-03-08 MED ORDER — LACTATED RINGERS IV SOLN: INTRAVENOUS | Status: DC

## 2018-03-08 MED ORDER — PROMETHAZINE HCL 25 MG/ML IJ SOLN
6.2500 mg | Freq: Once | INTRAMUSCULAR | Status: AC
  Administered 2018-03-08: 6.25 mg via INTRAVENOUS
  Filled 2018-03-08: qty 1

## 2018-03-08 MED ORDER — METOPROLOL TARTRATE 25 MG PO TABS
12.5000 mg | ORAL_TABLET | Freq: Two times a day (BID) | ORAL | Status: DC
  Administered 2018-03-08 – 2018-03-11 (×8): 12.5 mg
  Filled 2018-03-08 (×10): qty 1

## 2018-03-08 MED ORDER — OSMOLITE 1.5 CAL PO LIQD
1000.0000 mL | ORAL | Status: DC
  Administered 2018-03-08 – 2018-03-10 (×2): 1000 mL

## 2018-03-08 NOTE — Consult Note (Signed)
Cardiology Consultation:   [name] MRN: [id number]; DOB: 1928-01-22  Admit date: 02/26/2018 Date of Consult: 03/08/2018  Primary Care Provider: Deland Pretty, MD Primary Cardiologist: Darlene Breeding, MD  Primary Electrophysiologist:  None    Patient Profile:   [name] is a 85 y.o. female with a hx of CAD s/p CABG 2011, CKD stage III (followed by Dr. Florene [name]), HTN, HLD, DM II, CVA in 08/2016, chronic combined systolic and diastolic heart failure, CVA on 09/19/2016 w/ subacute infarction involving the right posterior limb of internal capsule and lateral thalamus / underlying dementing process consisting with vascular dementia with Alzheimer's, and h/o respiratory failure who is being seen today for the evaluation of Afib with RVR and elevated troponin at the request of Darlene Conger, NP.  History of Present Illness:   [name] is an 85yo female with PMH as above. Echo 08/2016 showed LV EF 55-60%, G1DD, mild MR, mild LAE. At that time, the patient experienced constant burning in the upper epigastric area and radiating into her throat with chest pressure and SOB and lasting for 10 minutes at a time. EKG showed ST at 130 bpm and improved with BB. BNP 1407. Tropoonin elevated 0.27  0.5. CXR showed cardiomegaly with perihilar and interstitial edema. At discharge, her amlodipine was decreased and BB increased with no recommendations for further workup at that time.   She was diagnosed with achalasia after a 50 lb weight loss and failure to thrive over the next year. She was treated with botox in October 2019 and sent to SNF with the hopes she would be able to swallow her food. 12/05/2017 she presented to [facility] from SNF with AMS  History obtained from chart d/t patient's continued AMS and no family present at bedside. She had not done well since her botox treatment. Her son was visiting from [place] and noticed that the patient was stuttering wither her speech and having difficulty with words one  day prior to admission. She progressed to total aphasia on day of presentation. She was noted to be in the process of being worked up for G-tube and pt reportedly has an upcoming appt with [name]. She was reported per EMR as relatively functional a month prior, walking with a walker and performing all of her ADLs.   She also experienced an episode of acute respiratory distress 12/21/2017 with worsening pulmonary edema and troponin drawn after the event with trend up to 12.17. Cardiology was consulted. She was seen by [name] cardiology with repeat echo showing EF decreased to 35-40%. It was noted there was no obvious regional patterns consistent with true ACS.  It was also noted that the elevated troponin likely suggested diffuse underlying disease in a patient who had acute heart failure with medical management recommended and Plavix restarted.  Due to pulmonary edema, she had experienced a recent 25 pound weight gain with dry weight noted to be around 116 pounds. She was IV diuresed. It was noted she was on metoprolol; however, if BP elevated, it was recommended she restart home Imdur to help with CAD and achalasia.  She was on hydralazine.  No recommendation to change her short acting metoprolol to long-acting Toprol-XL, given her labile condition.  Palliative care was consulted given the patient's altered mental status and goals discussed with family and friends. She was then transferred to [facility] as on 03/2018, it was decided that the patient attempt a 2w dialysis trial due to renal failure. She was declined for transfer at other  tertiary care centers. Patient's family thus requested trial HD at [name].   On 2/6, cardiology consulted due to Atrial Fibrillation with RVR and elevated troponin in the setting of renal failure and the above comorbid conditions. Patient remains unable to communicate or provide HPI d/t AMS and history as above has been gathered per review of EMR.   Past Medical History:  Diagnosis  Date  . Acute renal injury ([place]) 07/2016  . Anginal pain ([place]) 2004; 2011  . CAD (coronary artery disease)    status post stenting of the RCA status post CABG. (left internal mammary artery to  LAD, saphenous vein graft to second diagonal, saphenous vein  graft to obtuse marginal 1, saphenous vein graft to posterior  descending).  . Cardiomyopathy     Mildly reduced EF (45%).   . Chronic kidney disease (CKD), stage III (moderate) ([place])   . History of blood transfusion 1960s   "related to anemia"  . HTN (hypertension)   . Pleural effusion 2011   S/P "bypass"  . Shortness of breath   . Stroke ([place]) 09/19/2016   "left sided weakness" (09/20/2016)  . Type II diabetes mellitus ([place])     Past Surgical History:  Procedure Laterality Date  . BOTOX INJECTION  11/20/2017   Procedure: BOTOX INJECTION;  Surgeon: Darlene Stabler, MD;  Location: [facility];  Service: Gastroenterology;;  . CATARACT EXTRACTION W/ INTRAOCULAR LENS  IMPLANT, BILATERAL Bilateral   . CORONARY ANGIOPLASTY WITH STENT PLACEMENT  2004   "LAD & one of the circumflex"  . CORONARY ARTERY BYPASS GRAFT  2011   CABG X4  . DIALYSIS/PERMA CATHETER INSERTION N/A 03/05/2018   Procedure: DIALYSIS/PERMA CATHETER INSERTION;  Surgeon: Darlene Huxley, MD;  Location: [facility];  Service: Cardiovascular;  Laterality: N/A;  . ESOPHAGOGASTRODUODENOSCOPY (EGD) WITH PROPOFOL N/A 11/20/2017   Procedure: ESOPHAGOGASTRODUODENOSCOPY (EGD) WITH PROPOFOL;  Surgeon: Darlene Stabler, MD;  Location: [facility];  Service: Gastroenterology;  Laterality: N/A;  . IR GASTROSTOMY TUBE MOD SED  12/10/2017     Home Medications:  Prior to Admission medications   Medication Sig Start Date End Date Taking? Authorizing Provider  acetaminophen (TYLENOL) 160 MG/5ML solution Place 15.6 mLs (500 mg total) into feeding tube every 6 (six) hours as needed for mild pain or fever. 02/26/18  Yes Darlene Cota, MD  albuterol (PROVENTIL) (2.5 MG/3ML)  0.083% nebulizer solution Take 3 mLs (2.5 mg total) by nebulization every 4 (four) hours as needed for wheezing or shortness of breath. 02/26/18  Yes Darlene Cota, MD  amLODipine (NORVASC) 10 MG tablet Take 10 mg by mouth daily.   Yes [provider]  clopidogrel (PLAVIX) 75 MG tablet Place 1 tablet (75 mg total) into feeding tube daily. 02/27/18  Yes Darlene Cota, MD  glipiZIDE (GLUCOTROL XL) 10 MG 24 hr tablet Take 10 mg by mouth daily with breakfast.   Yes [provider]  isosorbide mononitrate (IMDUR) 30 MG 24 hr tablet Take 30 mg by mouth daily.   Yes [provider]  Melatonin 3 MG TABS Take 1 tablet (3 mg total) by mouth at bedtime as needed (insomnia). 02/26/18  Yes Darlene Cota, MD  metoprolol tartrate (LOPRESSOR) 50 MG tablet Take 50 mg by mouth 2 (two) times daily.   Yes [provider]  nitroGLYCERIN (NITROSTAT) 0.4 MG SL tablet Place 1 tablet (0.4 mg total) under the tongue every 5 (five) minutes x 3 doses as needed for chest pain.  02/26/18  Yes Darlene Cota, MD  ondansetron [name]) 4 MG/2ML SOLN injection Inject 2 mLs (4 mg total) into the vein every 8 (eight) hours as needed for nausea or vomiting (nausea, vomiting). 02/26/18  Yes Darlene Cota, MD  atorvastatin (LIPITOR) 40 MG tablet Place 1 tablet (40 mg total) into feeding tube daily at 6 PM. 02/26/18   Darlene Cota, MD  chlorhexidine (PERIDEX) 0.12 % solution 15 mLs by Mouth Rinse route 2 (two) times daily. 02/26/18   Darlene Cota, MD  cholecalciferol (VITAMIN D3) 10 MCG (400 UNIT) TABS tablet Place 1 tablet (400 Units total) into feeding tube daily. 02/27/18   Darlene Cota, MD  collagenase (SANTYL) ointment Apply topically daily. 02/27/18   Darlene Cota, MD  famotidine (PEPCID) 10 MG tablet Place 1 tablet (10 mg total) into feeding tube daily. 02/27/18   Darlene Cota, MD  ferrous sulfate 300 (60 Fe) MG/5ML syrup Place 5 mLs (300 mg total) into  feeding tube daily. 02/27/18   Darlene Cota, MD  ipratropium (ATROVENT) 0.02 % nebulizer solution Take 2.5 mLs (0.5 mg total) by nebulization every 6 (six) hours as needed for wheezing or shortness of breath. 02/26/18   Darlene Cota, MD  mouth rinse LIQD solution 15 mLs by Mouth Rinse route 2 times daily at 12 noon and 4 pm. 02/26/18   Darlene Cota, MD  Multiple Vitamin (MULTIVITAMIN) LIQD Place 15 mLs into feeding tube daily. 02/27/18   Darlene Cota, MD  Nutritional Supplements (FEEDING SUPPLEMENT, NEPRO CARB STEADY,) LIQD Place 1,000 mLs into feeding tube daily. 02/26/18   Darlene Cota, MD  patiromer (VELTASSA) 8.4 g packet Take 1 packet (8.4 g total) by mouth daily. 02/27/18   Darlene Cota, MD  promethazine (PHENERGAN) 25 MG/ML injection Inject 0.25 mLs (6.25 mg total) into the vein every 6 (six) hours as needed for nausea or vomiting. 02/26/18   Darlene Cota, MD  Water For Irrigation, Sterile (FREE WATER) SOLN Place 30 mLs into feeding tube every 8 (eight) hours. 02/26/18   Darlene Cota, MD    Inpatient Medications: Scheduled Meds: . atorvastatin  40 mg Per Tube q1800  . B-complex with vitamin C  1 tablet Per Tube Daily  . chlorhexidine  15 mL Mouth Rinse BID  . clopidogrel  75 mg Per Tube QHS  . collagenase   Topical Daily  . famotidine  10 mg Per Tube Daily  . feeding supplement (PRO-STAT SUGAR FREE 64)  30 mL Per Tube BID  . free water  20 mL Per Tube Q4H  . insulin aspart  0-9 Units Subcutaneous Q4H  . mouth rinse  15 mL Mouth Rinse q12n4p  . metoprolol tartrate      . metoprolol tartrate  12.5 mg Per Tube BID  . multivitamin  15 mL Per Tube QHS  . nutrition supplement (JUVEN)  1 packet Per Tube BID BM  . pantoprazole (PROTONIX) IV  40 mg Intravenous Daily  . polyethylene glycol  17 g Oral Daily   Continuous Infusions: . albumin human    . ampicillin-sulbactam (UNASYN) IV 3 g (03/08/18 1153)  . feeding supplement (OSMOLITE 1.5 CAL) 20  mL/hr at 03/08/18 0700   PRN Meds: acetaminophen (TYLENOL) oral liquid 160 mg/5 mL **OR** acetaminophen **OR** acetaminophen, albumin human, albuterol, ipratropium, lip balm, Melatonin, nitroGLYCERIN, ondansetron, promethazine  Allergies:    Allergies  Allergen Reactions  . Reglan [Metoclopramide]     Pt's son  reports this causes patient tremors and neurologic issues   . Contrast Media [Iodinated Diagnostic Agents]     Sensitivity, kidney issues    Social History:   Social History   Socioeconomic History  . Marital status: Married    Spouse name: Not on file  . Number of children: 2  . Years of education: Not on file  . Highest education level: Not on file  Occupational History  . Occupation: Retired  Scientific laboratory technician  . Financial resource strain: Not on file  . Food insecurity:    Worry: Not on file    Inability: Not on file  . Transportation needs:    Medical: Not on file    Non-medical: Not on file  Tobacco Use  . Smoking status: Never Smoker  . Smokeless tobacco: Never Used  Substance and Sexual Activity  . Alcohol use: No  . Drug use: No  . Sexual activity: Not Currently  Lifestyle  . Physical activity:    Days per week: Not on file    Minutes per session: Not on file  . Stress: Not on file  Relationships  . Social connections:    Talks on phone: Not on file    Gets together: Not on file    Attends religious service: Not on file    Active member of club or organization: Not on file    Attends meetings of clubs or organizations: Not on file    Relationship status: Not on file  . Intimate partner violence:    Fear of current or ex partner: Not on file    Emotionally abused: Not on file    Physically abused: Not on file    Forced sexual activity: Not on file  Other Topics Concern  . Not on file  Social History Narrative  . Not on file    Family History:    Family History  Problem Relation Age of Onset  . Diabetes Father 40  . Unexplained death Mother  17  . CAD Neg Hx      ROS:  Please see the history of present illness.  Review of Systems  Unable to perform ROS: Mental status change    All other ROS reviewed and negative.     Physical Exam/Data:   Vitals:   03/08/18 0900 03/08/18 1000 03/08/18 1100 03/08/18 1153  BP: 126/71 117/68 128/79   Pulse: (!) 105 100 98 100  Resp: 15 (!) 23 18 (!) 23  Temp:    97.8 F (36.6 C)  TempSrc:    Axillary  SpO2: 99% 97% 100% 99%  Weight:      Height:        Intake/Output Summary (Last 24 hours) at 03/08/2018 1342 Last data filed at 03/08/2018 1015 Gross per 24 hour  Intake 1535.78 ml  Output -  Net 1535.78 ml   Filed Weights   03/06/18 2000 03/07/18 0500 03/08/18 0237  Weight: 73.7 kg 73.7 kg 74.1 kg   Body mass index is 28.94 kg/m.  General:  Frail female, in distress but unable to communicate HEENT: normal Vascular: No carotid bruits Cardiac:  RRR, tachycardic with injection of insulin Lungs:  Reduced bibasilar breath sounds, worse on L  Abd: soft, nontender, no hepatomegaly  Ext: 3+ pitting edema Musculoskeletal:  No deformities, BUE and BLE strength normal and equal Skin: warm and dry  Neuro: AMS Psych:  Normal affect   EKG:  The EKG was personally reviewed and demonstrates: Afib now SR Telemetry:  Telemetry  was personally reviewed and demonstrates:  IRIR 2AM-6AM, now NSR with rates 80s to low 100s  Relevant CV Studies:  Echo 12/21/2017 LV EF: 30% Study Conclusions  - Left ventricle: The cavity size was normal. Wall thickness was increased in a pattern of mild LVH. The estimated ejection fraction was 30%. Diffuse hypokinesis. Features are consistent with a pseudonormal left ventricular filling pattern, with concomitant abnormal relaxation and increased filling pressure (grade 2 diastolic dysfunction). - Aortic valve: There was no stenosis. - Mitral valve: There was mild to moderate regurgitation. - Left atrium: The atrium was mildly dilated. - Right  ventricle: The cavity size was normal. Systolic function was mildly reduced. - Pulmonary arteries: No complete TR doppler jet so unable to estimate PA systolic pressure. - Inferior vena cava: The vessel was normal in size. The respirophasic diameter changes were in the normal range (>= 50%), consistent with normal central venous pressure. Impressions: - Normal LV size with mild LV hypertrophy. EF 30%, diffuse hypokinesis. Moderate diastolic dysfunction. Normal RV size with mildly decreased systolic function. Mild to moderate MR.   Laboratory Data:  Chemistry Recent Labs  Lab 03/05/18 0445 03/07/18 0425 03/08/18 0255  NA 140 140 138  K 4.3 3.6 3.8  CL 103 107 101  CO2 29 27 27   GLUCOSE 214* 106* 258*  BUN 66* 58* 68*  CREATININE 2.38* 2.11* 2.77*  CALCIUM 9.1 8.1* 8.7*  GFRNONAA 18* 20* 15*  GFRAA 20* 24* 17*  ANIONGAP 8 6 10     Recent Labs  Lab 03/04/18 0812 03/07/18 0425  PROT 6.3* 5.9*  ALBUMIN 2.0* 1.8*  AST 118* 248*  ALT 134* 298*  ALKPHOS 152* 144*  BILITOT 0.9 0.8   Hematology Recent Labs  Lab 03/06/18 1625 03/07/18 0425 03/08/18 0255  WBC 8.0 8.6 7.6  RBC 2.62* 2.63* 2.70*  HGB 7.8* 7.9* 8.1*  HCT 26.6* 26.5* 27.5*  MCV 101.5* 100.8* 101.9*  MCH 29.8 30.0 30.0  MCHC 29.3* 29.8* 29.5*  RDW 16.7* 16.7* 17.0*  PLT 177 164 158   Cardiac Enzymes Recent Labs  Lab 03/08/18 0255 03/08/18 0834  TROPONINI 0.92* 0.94*   No results for input(s): TROPIPOC in the last 168 hours.  BNP Recent Labs  Lab 03/08/18 0255  BNP >4,500.0*    DDimer No results for input(s): DDIMER in the last 168 hours.  Radiology/Studies:  Dg Chest 1 View  Result Date: 03/06/2018 CLINICAL DATA:  Shortness of breath.  New PermCath. EXAM: CHEST  1 VIEW COMPARISON:  03/02/2018 FINDINGS: The right IJ PermCath tips are in good position the distal SVC and right atrium. No complicating features. A right-sided PICC line is also in place with its tip near the  subclavian jugular junction. The heart is enlarged but stable. There is central vascular congestion, bilateral pleural effusions and bibasilar atelectasis or infiltrates. No overt pulmonary edema. IMPRESSION: Right IJ PermCath in good position without complicating features. The PICC line is also stable. Cardiac enlargement, pleural effusions and bibasilar infiltrates or atelectasis. Moderate central vascular congestion without overt pulmonary edema. Electronically Signed   By: Marijo Sanes M.D.   On: 03/06/2018 16:32   Dg Abd 1 View  Result Date: 03/06/2018 CLINICAL DATA:  Gastrostomy tube obstruction. EXAM: ABDOMEN - 1 VIEW COMPARISON:  KUB 03/06/2018 and 02/27/2018.  Chest x-ray 03/02/2018. FINDINGS: Right PICC line in stable position. Right IJ large caliber central line noted with tip over right atrium. Prior CABG. Cardiomegaly. Bibasilar infiltrates/edema and bilateral pleural effusions again noted. Findings suggest CHF.  Bibasilar atelectasis. No pneumothorax. Gastrostomy tube noted. Contrast noted in the stomach, duodenum, and proximal small bowel. IMPRESSION: 1. Right PICC line stable position. Right IJ large caliber central line noted with tip over right atrium. Prior CABG. Cardiomegaly. Bibasilar infiltrates/edema and bilateral pleural effusions. Findings again suggest CHF. Bibasilar atelectasis. 2. Gastrostomy tube noted over the stomach. Contrast noted within the stomach, duodenum, and proximal small bowel. Electronically Signed   By: Marcello Moores  Register   On: 03/06/2018 15:26   Dg Abd 1 View  Result Date: 03/06/2018 CLINICAL DATA:  Gastrostomy catheter  dysfunction EXAM: ABDOMEN - 1 VIEW COMPARISON:  02/27/2018 FINDINGS: Gastrostomy catheter is noted adjacent to the T tacks in the anterior abdominal wall stable from the previous exam. Right femoral dialysis catheter is again noted as well as a jugular placed dialysis catheter in the right atrium. IMPRESSION: Stable appearance of gastrostomy catheter.  Contrast injection would be required to assess for internal patency. Electronically Signed   By: Inez Catalina M.D.   On: 03/06/2018 11:21   Dg Chest Port 1 View  Result Date: 03/08/2018 CLINICAL DATA:  Acute respiratory failure EXAM: PORTABLE CHEST 1 VIEW COMPARISON:  03/07/2018 FINDINGS: Postoperative changes in the mediastinum. Right central venous dialysis catheter with tip over the right atrium. Right PICC line with tip over the upper SVC region. No pneumothorax. Cardiac enlargement. Bilateral pleural effusions with basilar atelectasis and/or edema. No change since previous study. IMPRESSION: Appliances appear in satisfactory location. Cardiac enlargement with bilateral pleural effusions and basilar atelectasis or edema. Electronically Signed   By: Lucienne Capers M.D.   On: 03/08/2018 03:37   Dg Chest Port 1 View  Result Date: 03/07/2018 CLINICAL DATA:  Respiratory failure EXAM: PORTABLE CHEST 1 VIEW COMPARISON:  March 06, 2018 FINDINGS: Dual lumen catheter tip is at the cavoatrial junction. Right single-lumen central catheter has its tip in the right innominate vein near the junction with the superior vena cava. There is an apparent skin fold on the left. No pneumothorax is demonstrable. There is cardiomegaly with mild pulmonary venous hypertension. There are pleural effusions bilaterally with mild interstitial edema. There is subtle opacity in the right upper lobe which may represent either alveolar edema or a small area of pneumonitis. There is an old healed fracture of the proximal right humerus. IMPRESSION: Catheter positions unchanged. No evident pneumothorax. There is a skin fold on the left. There is pulmonary vascular congestion with pleural effusions bilaterally and mild interstitial edema. Question mild alveolar edema versus small focus of pneumonia right upper lobe. Electronically Signed   By: Lowella Grip III M.D.   On: 03/07/2018 07:15    Assessment and Plan:   Paroxysmal Afib  with RVR - Onset 2/6 from 2AM-6AM. Patient not able to provide history or indicate if symptomatic in Afib and currently SR to sinus tachycardia. EMR noted patient's family declined anticoagulation due to family reported history of bleeding on heparin (no other details provided and family unable to be consulted at this time).  CHA2DS2VASc score of at least 9 (CHF, HTN, agex2, DM, strokex2, vascular, female). Severe anemia with Hgb 8.1. Would defer anticoagulation for now given episode of Afib less than 48h and now NSR. If further episodes of Afib and if sustained for longer than a few hours, would recommend revisit family's preference against anticoagulation. Continue to monitor on telemetry. With recovery, and if no recurrence on monitor, recommend ZIO monitor as outpatient. For now, will continue to monitor on telemetry. No need for DCCV given SR.  Given soft BP and currently controlled rates, no recommendation for rate control at this time to reduce risk of worsening renal function. Will follow.   Elevated Troponin with h/o CAD s/p NSTEMI - Unable to assess if chest pain. EKG without acute changes. Troponin likely elevated in the setting of rapid ventricular rate, AKI/ARF, infection, respiratory distress, and the above. No recommendation for invasive ischemic workup given AMS, severe anemia, renal failure. Will follow.   ARF - HD. Per nephrology.   For questions or updates, please contact [place] Please consult [email or web address] for contact info under     Signed, Arvil Chaco, PA-C  03/08/2018 1:42 PM

## 2018-03-08 NOTE — Progress Notes (Signed)
HD tx end    03/08/18 1745  Vital Signs  Pulse Rate (!) 110  Pulse Rate Source Monitor  Resp (!) 29  BP 113/73  BP Location Left Arm  BP Method Automatic  Patient Position (if appropriate) Lying  Oxygen Therapy  SpO2 99 %  O2 Device Room Air  During Hemodialysis Assessment  Dialysis Fluid Bolus Normal Saline  Bolus Amount (mL) 250 mL  Intra-Hemodialysis Comments Tx completed

## 2018-03-08 NOTE — Progress Notes (Signed)
Post HD assessment. Pt tolerated tx well without c/o or complication. Net UF 1502, goal met.    03/08/18 1754  Vital Signs  Temp (!) 96.6 F (35.9 C)  Temp Source Axillary  Pulse Rate 97  Pulse Rate Source Monitor  Resp 20  BP 125/68  BP Location Left Arm  BP Method Automatic  Patient Position (if appropriate) Lying  Oxygen Therapy  SpO2 100 %  O2 Device Room Air  Dialysis Weight  Weight 80.2 kg  Type of Weight Post-Dialysis  Post-Hemodialysis Assessment  Rinseback Volume (mL) 250 mL  KECN 61.1 V  Dialyzer Clearance Lightly streaked  Duration of HD Treatment -hour(s) 3.5 hour(s)  Hemodialysis Intake (mL) 500 mL  UF Total -Machine (mL) 2002 mL  Net UF (mL) 1502 mL  Tolerated HD Treatment Yes  Education / Care Plan  Dialysis Education Provided Yes  Documented Education in Care Plan Yes  Hemodialysis Catheter Right Subclavian Double-lumen;Permanent  Placement Date: 03/05/18   Orientation: Right  Access Location: Subclavian  Hemodialysis Catheter Type: Double-lumen;Permanent  Site Condition No complications  Blue Lumen Status Heparin locked  Red Lumen Status Heparin locked  Purple Lumen Status N/A  Catheter fill solution Heparin 1000 units/ml  Catheter fill volume (Arterial) 1.5 cc  Catheter fill volume (Venous) 1.5  Dressing Type Gauze/Drain sponge  Dressing Status Clean;Dry;Intact  Drainage Description None  Post treatment catheter status Capped and Clamped

## 2018-03-08 NOTE — Progress Notes (Signed)
CRITICAL CARE NOTE  CC  Lethargy, acute hypoxia  SUBJECTIVE Resting in bed comfortably, no new complaints.  Overnight events noted.    SIGNIFICANT EVENTS -Status post HD   Vitals:   03/08/18 1515 03/08/18 1530  BP: (!) 111/58 110/75  Pulse: 100 100  Resp: 13 (!) 31  Temp:    SpO2: 100% 100%     REVIEW OF SYSTEMS  PATIENT IS UNABLE TO PROVIDE COMPLETE REVIEW OF SYSTEMS DUE TO acute critical ILLNESS   PHYSICAL EXAMINATION:  GENERAL: Dementia HEAD: Normocephalic, atraumatic.  EYES: Pupils equal, round, reactive to light.  No scleral icterus.  MOUTH: Moist mucosal membrane. NECK: Supple. No thyromegaly. No nodules. No JVD.  PULMONARY: +rhonchi at bases CARDIOVASCULAR: S1 and S2. Regular rate and rhythm. No murmurs, rubs, or gallops.  GASTROINTESTINAL: Soft, nontender, -distended. No masses. Positive bowel sounds. No hepatosplenomegaly.  MUSCULOSKELETAL: No swelling, clubbing, or edema.  NEUROLOGIC: Mild distress due to acute illness SKIN:intact,warm,dry   Labs and Imaging:     -I personally reviewed most recent blood work, imaging and microbiology - significant findings today are AKI stage III, normocytic anemia.  Chest x-ray with compressive atelectasis secondary to bilateral pleural effusions  LAB RESULTS: Recent Labs  Lab 03/05/18 0445 03/07/18 0425 03/08/18 0255  NA 140 140 138  K 4.3 3.6 3.8  CL 103 107 101  CO2 29 27 27   BUN 66* 58* 68*  CREATININE 2.38* 2.11* 2.77*  GLUCOSE 214* 106* 258*   Recent Labs  Lab 03/06/18 1625 03/07/18 0425 03/08/18 0255  HGB 7.8* 7.9* 8.1*  HCT 26.6* 26.5* 27.5*  WBC 8.0 8.6 7.6  PLT 177 164 158     IMAGING RESULTS: Dg Chest Port 1 View  Result Date: 03/08/2018 CLINICAL DATA:  Acute respiratory failure EXAM: PORTABLE CHEST 1 VIEW COMPARISON:  03/07/2018 FINDINGS: Postoperative changes in the mediastinum. Right central venous dialysis catheter with tip over the right atrium. Right PICC line with tip over the  upper SVC region. No pneumothorax. Cardiac enlargement. Bilateral pleural effusions with basilar atelectasis and/or edema. No change since previous study. IMPRESSION: Appliances appear in satisfactory location. Cardiac enlargement with bilateral pleural effusions and basilar atelectasis or edema. Electronically Signed   By: Lucienne Capers M.D.   On: 03/08/2018 03:37     Acute Hypoxic Respiratory Failure -Improved now on room air -Compressive atelectasis secondary to bilateral pleural effusions with right middle lobe hazy opacification suggestive of possible aspiration pneumonia -continue Bronchodilator Therapy -No signs of sepsis-however PCT elevated, we will DC Unasyn in a.m. -Repeat CXR pending for interval changes  Altered mental status-lethargy -Secondary to toxic metabolic encephalopathy/uremic encephalopathy -Renal team on case appreciate recommendation   CARDIAC FAILURE- -transient hypotension post hemodialysis -initially BP 72/25-status post HD today -Complicated by heart failure with reduced EF at 30% with grade 2 diastolic dysfunction. -BP improved   Multiple comorbid conditions including but not limited to Dementia, CVA dyshpagia with PEG tube, achalasia , abdominal lymphadenopathy concerning for malignancy -Complicating comorbidities and concern for coagulopathy secondary to neoplasm, overall poor prognosis -We will obtain palliative care consultation    Renal Failure-most likely due to ATN with possible component of chronic kidney disease -Renal team on case appreciate recommendations -follow chem 7 -follow UO -continue Foley Catheter-assess need daily   NEUROLOGY -Acute lethargy improving, history of CVA,  baseline dementia,    GI/Nutrition GI PROPHYLAXIS as indicated-protonix 40 daily DIET--> dysphagia, achalasia, PEG tube- D5NS at 30 mL's per hour for now with nutritional consultation additional recommendations  constipation protocol as  indicated  ENDO - ICU hypoglycemic\Hyperglycemia protocol -check FSBS per protocol   ELECTROLYTES -follow labs as needed -replace as needed -pharmacy consultation and following   DVT/GI PRX ordered TRANSFUSIONS AS NEEDED MONITOR FSBS ASSESS the need for LABS as needed   Critical care provider statement:    Critical care time (minutes):  35   Critical care time was exclusive of:  Separately billable procedures and  treating other patients   Critical care was necessary to treat or prevent imminent or  life-threatening deterioration of the following conditions:   Lethargy, acute on chronic kidney disease, acute hypoxia secondary to pleural effusions with atelectasis, transient hypotension   Critical care was time spent personally by me on the following  activities:  Development of treatment plan with patient or surrogate,  discussions with consultants, evaluation of patient's response to  treatment, examination of patient, obtaining history from patient or  surrogate, ordering and performing treatments and interventions, ordering  and review of laboratory studies and re-evaluation of patient's condition   I assumed direction of critical care for this patient from another  provider in my specialty: no     Ottie Glazier, M.D.  [facility] [place]

## 2018-03-08 NOTE — Progress Notes (Signed)
Pre HD assessment   03/08/18 1404  Vital Signs  Temp (!) 96.8 F (36 C)  Temp Source Axillary  Pulse Rate 96  Pulse Rate Source Monitor  Resp (!) 9  BP 138/71  BP Location Left Arm  BP Method Automatic  Patient Position (if appropriate) Lying  Oxygen Therapy  SpO2 100 %  O2 Device Room Air  Pain Assessment  Pain Scale 0-10  Pain Score 0  Dialysis Weight  Weight 82.3 kg  Type of Weight Pre-Dialysis  Time-Out for Hemodialysis  What Procedure? HD  Pt Identifiers(min of two) First/Last Name;MRN/Account#  Correct Site? Yes  Correct Side? Yes  Correct Procedure? Yes  Consents Verified? Yes  Rad Studies Available? N/A  Safety Precautions Reviewed? Yes  Research scientist (physical sciences)  (2A)  Station Number 4  UF/Alarm Test Passed  Conductivity: Meter 14  Conductivity: Machine  14.2  pH 7.4  Reverse Osmosis main  Normal Saline Lot Number [id number]  Dialyzer Lot Number [id number]  Disposable Set Lot Number [id number]  Machine Temperature 98.6 F (37 C)  Musician and Audible Yes  Blood Lines Intact and Secured Yes  Pre Treatment Patient Checks  Vascular access used during treatment Catheter  Hepatitis B Surface Antigen Results Negative  Date Hepatitis B Surface Antigen Drawn 02/26/18  Hepatitis B Surface Antibody  (<10)  Date Hepatitis B Surface Antibody Drawn 02/26/18  Hemodialysis Consent Verified Yes  Hemodialysis Standing Orders Initiated Yes  ECG (Telemetry) Monitor On Yes  Prime Ordered Normal Saline  Length of  DialysisTreatment -hour(s) 3.5 Hour(s)  Dialyzer Elisio 17H NR  Dialysate 3K, 2.5 Ca  Dialysis Anticoagulant None  Dialysate Flow Ordered 600  Blood Flow Rate Ordered 300 mL/min  Ultrafiltration Goal 1.5 Liters  Pre Treatment Labs Phosphorus;Other (Comment) (troponin)  Dialysis Blood Pressure Support Ordered Normal Saline  Education / Care Plan  Dialysis Education Provided Yes  Documented Education in Care Plan Yes  Hemodialysis Catheter Right  Subclavian Double-lumen;Permanent  Placement Date: 03/05/18   Orientation: Right  Access Location: Subclavian  Hemodialysis Catheter Type: Double-lumen;Permanent  Site Condition No complications  Blue Lumen Status Heparin locked  Red Lumen Status Heparin locked  Purple Lumen Status N/A  Dressing Type Gauze/Drain sponge  Dressing Status Clean;Dry;Intact  Drainage Description None

## 2018-03-08 NOTE — Progress Notes (Signed)
[name] at [place] NAME: [name]    MR#:  [id number]  DATE OF BIRTH:  11/07/1927  SUBJECTIVE:   More awake and alert. Per son pt was doing some math (simple) earleir BP better Went into Afib last nite  REVIEW OF SYSTEMS:   Review of Systems  Constitutional: Negative for chills, fever and weight loss.  HENT: Negative for ear discharge, ear pain and nosebleeds.   Eyes: Negative for blurred vision, pain and discharge.  Respiratory: Negative for sputum production, shortness of breath, wheezing and stridor.   Cardiovascular: Negative for chest pain, palpitations, orthopnea and PND.  Gastrointestinal: Negative for abdominal pain, diarrhea, nausea and vomiting.  Genitourinary: Negative for frequency and urgency.  Musculoskeletal: Negative for back pain and joint pain.  Neurological: Positive for weakness. Negative for sensory change, speech change and focal weakness.  Psychiatric/Behavioral: Negative for depression and hallucinations. The patient is not nervous/anxious.      DRUG ALLERGIES:   Allergies  Allergen Reactions  . Reglan [Metoclopramide]     Pt's son reports this causes patient tremors and neurologic issues   . Contrast Media [Iodinated Diagnostic Agents]     Sensitivity, kidney issues    VITALS:  Blood pressure 110/75, pulse 100, temperature (!) 96.8 F (36 C), temperature source Axillary, resp. rate (!) 31, height 5\' 3"  (1.6 m), weight 82.3 kg, SpO2 100 %.  PHYSICAL EXAMINATION:   Physical Exam limited exam   GENERAL:  85 y.o.-year-old patient lying in the bed with no acute distress. Chronically ill. Anasarca EYES: Pupils equal, round, reactive to light and accommodation. No scleral icterus. Extraocular muscles intact.  HEENT: Head atraumatic, normocephalic. Oropharynx and nasopharynx clear.  NECK:  Supple, no jugular venous distention. No thyroid enlargement, no tenderness.  LUNGS: Normal breath sounds  bilaterally, no wheezing, rales, rhonchi. No use of accessory muscles of respiration.  CARDIOVASCULAR: S1, S2 normal. No murmurs, rubs, or gallops.  ABDOMEN: Soft, nontender, nondistended. Bowel sounds present. No organomegaly or mass. PEG site ok EXTREMITIES: No cyanosis, clubbing or edema b/l.    NEUROLOGIC: very limited. Patient follow limited commands.  PSYCHIATRIC:  patient more awake today  LABORATORY PANEL:  CBC Recent Labs  Lab 03/08/18 0255  WBC 7.6  HGB 8.1*  HCT 27.5*  PLT 158    Chemistries  Recent Labs  Lab 03/07/18 0425 03/08/18 0255  NA 140 138  K 3.6 3.8  CL 107 101  CO2 27 27  GLUCOSE 106* 258*  BUN 58* 68*  CREATININE 2.11* 2.77*  CALCIUM 8.1* 8.7*  AST 248*  --   ALT 298*  --   ALKPHOS 144*  --   BILITOT 0.8  --    Cardiac Enzymes Recent Labs  Lab 03/08/18 0834  TROPONINI 0.94*   RADIOLOGY:  Dg Chest 1 View  Result Date: 03/06/2018 CLINICAL DATA:  Shortness of breath.  New PermCath. EXAM: CHEST  1 VIEW COMPARISON:  03/02/2018 FINDINGS: The right IJ PermCath tips are in good position the distal SVC and right atrium. No complicating features. A right-sided PICC line is also in place with its tip near the subclavian jugular junction. The heart is enlarged but stable. There is central vascular congestion, bilateral pleural effusions and bibasilar atelectasis or infiltrates. No overt pulmonary edema. IMPRESSION: Right IJ PermCath in good position without complicating features. The PICC line is also stable. Cardiac enlargement, pleural effusions and bibasilar infiltrates or atelectasis. Moderate central vascular congestion without overt pulmonary edema.  Electronically Signed   By: Marijo Sanes M.D.   On: 03/06/2018 16:32   Dg Chest Port 1 View  Result Date: 03/08/2018 CLINICAL DATA:  Acute respiratory failure EXAM: PORTABLE CHEST 1 VIEW COMPARISON:  03/07/2018 FINDINGS: Postoperative changes in the mediastinum. Right central venous dialysis catheter with  tip over the right atrium. Right PICC line with tip over the upper SVC region. No pneumothorax. Cardiac enlargement. Bilateral pleural effusions with basilar atelectasis and/or edema. No change since previous study. IMPRESSION: Appliances appear in satisfactory location. Cardiac enlargement with bilateral pleural effusions and basilar atelectasis or edema. Electronically Signed   By: Lucienne Capers M.D.   On: 03/08/2018 03:37   Dg Chest Port 1 View  Result Date: 03/07/2018 CLINICAL DATA:  Respiratory failure EXAM: PORTABLE CHEST 1 VIEW COMPARISON:  March 06, 2018 FINDINGS: Dual lumen catheter tip is at the cavoatrial junction. Right single-lumen central catheter has its tip in the right innominate vein near the junction with the superior vena cava. There is an apparent skin fold on the left. No pneumothorax is demonstrable. There is cardiomegaly with mild pulmonary venous hypertension. There are pleural effusions bilaterally with mild interstitial edema. There is subtle opacity in the right upper lobe which may represent either alveolar edema or a small area of pneumonitis. There is an old healed fracture of the proximal right humerus. IMPRESSION: Catheter positions unchanged. No evident pneumothorax. There is a skin fold on the left. There is pulmonary vascular congestion with pleural effusions bilaterally and mild interstitial edema. Question mild alveolar edema versus small focus of pneumonia right upper lobe. Electronically Signed   By: Lowella Grip III M.D.   On: 03/07/2018 07:15   ASSESSMENT AND PLAN:  [name] is an67 y.o.femalewith multiple comorbidities including diabetes type 2, hypertension, hyperlipidemia, coronary disease status post CABG, history of stroke, and CKD who was admitted on1/27/2020. [facility] a[facility]and 50 pound weight loss in 12/2017.Status post treatment for ESBL Klebsiella aspiration pneumonia, enterococcus  UTI. Admitted for dialysis.  1. Acute kidney injury on chronic kidney disease stage III. Uremia, and multiple other comorbidities likely causing altered mental status/acute metabolic encephalopathy - complicated by CHF, poor nutrition, cardio renal syndrome. - Nephrologyfollowing; patient receiveddialysis treatment almost daily.Urine output low. Willcontinue tomonitor patient's ability to tolerate due to her frail status and critical illness -patient did not get dialysis yesterday due to low blood pressure--BP better now getting hd TODAY -pt has received IV albumin during dialysis 03/02/18 - s/p permacath.  2.Acute on chronic mild systolic, diastolic congestive heart failure with moderate MR and bilateral pleural effusion with and Anasarca and hypoalbuminemia, pending improvement with HD -Holding isosorbide-hydralazine, patient not hypertensive on single agent at this time with low diastolic pressures - small dose metoprolol  3.Dysphagia secondary to achalasia. Patient failed Botox and status post peg tube feeding - patient has been having symptoms of vomiting. She has been getting tube feeding at 20 mL an hour. -recs forTube feeding per nutrition, in place per GI c/s - daughter requested 03/02/2018 to stop all tube feeds for concern of aspiration on emesis --- G.I. be consulted. Dr. Ricky Stabs outpatient today and peg tube seems to be flushed well and working.  4.Type II diabetes -sliding scale insulin  5.History of left temporal and frontal punctate CVA -on Plavix and Lipitor  6.Status post non-STEMI -- continue Plavix and statins  7. Lymph node within umbilical hernia, son declined core biopsyaccording to [facility] clinicalnotesGeneral surgery [facility],  no further recommendations at this time.-- Son has declined further workup according to [facility] cone notes  8. Deconditioning, frailty  - PTtoevaluateand treatment ordered, as tolerated [place] need  to sit in chair for Out pt HD>  9. Hypothermia Resolved. Bear hugger as needed  Had meeting with Dr Martinique, Dr Mayer Masker (son), Dr Holley Raring and Elberta Leatherwood (2c Director) Pt's son is overall pleased with the care for his morther. Plan is to cont HD as per nephrology  CODE STATUS: full  DVT Prophylaxis: family has declined to use heparin  TOTAL TIME TAKING CARE OF THIS PATIENT: 30 minutes.  >50% time spent on counselling and coordination of care  Note: This dictation was prepared with Dragon dictation along with smaller phrase technology. Any transcriptional errors that result from this process are unintentional.  Fritzi Mandes M.D on 03/08/2018 at 3:35 PM  Between 7am to 6pm - Pager - [phone]  After 6pm go to [email or web address] - password [email or web address] [place] [facility]  Office  [phone]  CC: Primary care physician; Deland Pretty, MDPatient ID: [name], female   DOB: 03-10-1927, 85 y.o.   MRN: [id number]

## 2018-03-08 NOTE — Progress Notes (Signed)
*  PRELIMINARY RESULTS* Echocardiogram 2D Echocardiogram has been performed.  [place] 03/08/2018, 9:25 AM

## 2018-03-08 NOTE — Progress Notes (Signed)
HD tx start    03/08/18 1411  Vital Signs  Pulse Rate 97  Pulse Rate Source Monitor  Resp 11  BP 107/83  BP Location Left Arm  BP Method Automatic  Patient Position (if appropriate) Lying  Oxygen Therapy  SpO2 100 %  O2 Device Room Air  During Hemodialysis Assessment  Blood Flow Rate (mL/min) 300 mL/min  Arterial Pressure (mmHg) -100 mmHg  Venous Pressure (mmHg) 110 mmHg  Transmembrane Pressure (mmHg) 60 mmHg  Ultrafiltration Rate (mL/min) 570 mL/min  Dialysate Flow Rate (mL/min) 600 ml/min  Conductivity: Machine  14.1  HD Safety Checks Performed Yes  Dialysis Fluid Bolus Normal Saline  Bolus Amount (mL) 250 mL  Intra-Hemodialysis Comments Tx initiated  Hemodialysis Catheter Right Subclavian Double-lumen;Permanent  Placement Date: 03/05/18   Orientation: Right  Access Location: Subclavian  Hemodialysis Catheter Type: Double-lumen;Permanent  Blue Lumen Status Infusing  Red Lumen Status Infusing

## 2018-03-08 NOTE — Progress Notes (Signed)
Pre HD Assessment    03/08/18 1405  Neurological  Level of Consciousness Alert  Orientation Level Oriented to person;Disoriented to place;Disoriented to time;Disoriented to situation  Respiratory  Respiratory Pattern Regular;Unlabored  Chest Assessment Chest expansion symmetrical  Cardiac  Pulse Regular  ECG Monitor Yes  Cardiac Rhythm ST;NSR  Vascular  R Radial Pulse +1  L Radial Pulse +1  Edema Generalized;Right upper extremity;Left upper extremity;Right lower extremity;Left lower extremity  Integumentary  Integumentary (WDL) X  Skin Color Appropriate for ethnicity  Musculoskeletal  Musculoskeletal (WDL) X  Generalized Weakness Yes  Assistive Device None  GU Assessment  Genitourinary (WDL) X  Genitourinary Symptoms  (HD)  Psychosocial  Psychosocial (WDL) X  Patient Behaviors Uncooperative;Restless

## 2018-03-08 NOTE — Progress Notes (Signed)
Inpatient Diabetes Program Recommendations  AACE/ADA: New Consensus Statement on Inpatient Glycemic Control (2015)  Target Ranges:  Prepandial:   less than 140 mg/dL      Peak postprandial:   less than 180 mg/dL (1-2 hours)      Critically ill patients:  140 - 180 mg/dL   Lab Results  Component Value Date   GLUCAP 164 (H) 03/08/2018   HGBA1C 5.9 (H) 12/24/2017    Review of Glycemic Control Results for [name], [name] (MRN [id number]) as of 03/08/2018 11:47  Ref. Range 03/07/2018 19:44 03/07/2018 23:39 03/08/2018 03:38 03/08/2018 07:41 03/08/2018 11:33  Glucose-Capillary Latest Ref Range: 70 - 99 mg/dL 132 (H) 190 (H) 228 (H) 158 (H) 164 (H)   Diabetes history: DM Outpatient Diabetes medications: Glucotrol 10 mg daily Current orders for Inpatient glycemic control:  Novolog moderate q 4 hours Osmolite 30 cc/ hr  Inpatient Diabetes Program Recommendations:   Due to renal failure, please reduce Novolog correction to sensitive q 4 hours.   Thanks,  Adah Perl, RN, BC-ADM Inpatient Diabetes Coordinator Pager [phone] (8a-5p)

## 2018-03-08 NOTE — Progress Notes (Signed)
CRITICAL VALUE ALERT  Critical Value:  Troponin 0.92  Date & Time Notied: 03/08/2018 at 0400  Provider Notified: Darel Hong, NP  Orders Received/Actions taken: Metoprolol 12.5 mg twice daily. Hold fluids.  Cameron Ali, RN

## 2018-03-08 NOTE — Progress Notes (Signed)
Pt has converted into Atrial fibrillation on telemetry, no prior history of A-fib noted.   HR ranges from 90-125.  Will obtain EKG, BNP, and Troponin.  Pt's CHADS-VASc score is 8, HAS-BLED is 4.  CXR is pending.  Will give low dose metoprolol 2.5 mg IV once for rate control given pt's BP is soft (low 100's).  Discussed with pt's son at bedside.  Pt's son does not want pt to be anticoagulated with Heparin due to prior bleed with heparin.  Will hold off on anticoagulation for now.      Darel Hong, AGACNP-BC [facility] Pager: [phone] Cell: [phone]

## 2018-03-08 NOTE — Progress Notes (Signed)
[place], [place] 03/08/18  Subjective:  Patient seen at bedside. Son at bedside as well. Her status appears improved. A bit more conversant today. Oriented to self only.   Objective:  Vital signs in last 24 hours:  Temp:  [97.5 F (36.4 C)-98.4 F (36.9 C)] 98.4 F (36.9 C) (02/06 0800) Pulse Rate:  [72-105] 100 (02/06 1000) Resp:  [8-26] 23 (02/06 1000) BP: (99-133)/(52-91) 117/68 (02/06 1000) SpO2:  [90 %-100 %] 97 % (02/06 1000) Weight:  [74.1 kg] 74.1 kg (02/06 0237)  Weight change: 0.4 kg Filed Weights   03/06/18 2000 03/07/18 0500 03/08/18 0237  Weight: 73.7 kg 73.7 kg 74.1 kg    Intake/Output:    Intake/Output Summary (Last 24 hours) at 03/08/2018 1125 Last data filed at 03/08/2018 1015 Gross per 24 hour  Intake 1775.13 ml  Output -  Net 1775.13 ml     Physical Exam: General:  Critically ill-appearing  HEENT  dry oral mucous membranes  Neck  supple  Pulm/lungs  Scattered rhonchi, normal effort  CVS/Heart  no rub, irregular  Abdomen:   Soft, nontender, nondistended  Extremities:  2+ pitting edema   Neurologic:  Awake, oriented to self only, recognized her son  Skin:  No acute rashes  Access:  Right IJ permcath        Basic Metabolic Panel:  Recent Labs  Lab 03/03/18 0548 03/04/18 0812 03/05/18 0445 03/06/18 1308 03/07/18 0425 03/08/18 0255  NA 134* 137 140  --  140 138  K 3.9 3.8 4.3  --  3.6 3.8  CL 100 101 103  --  107 101  CO2 25 30 29   --  27 27  GLUCOSE 121* 171* 214*  --  106* 258*  BUN 55* 46* 66*  --  58* 68*  CREATININE 2.17* 1.94* 2.38*  --  2.11* 2.77*  CALCIUM 8.3* 8.6* 9.1  --  8.1* 8.7*  PHOS  --   --   --  3.0  --   --      CBC: Recent Labs  Lab 03/04/18 0812 03/05/18 0445 03/06/18 1625 03/07/18 0425 03/08/18 0255  WBC 8.3 9.6 8.0 8.6 7.6  HGB 7.7* 7.7* 7.8* 7.9* 8.1*  HCT 25.6* 25.9* 26.6* 26.5* 27.5*  MCV 100.0 99.6 101.5* 100.8* 101.9*  PLT 227 222 177 164 158      Lab  Results  Component Value Date   HEPBSAG Negative 02/26/2018   HEPBSAB Non Reactive 02/26/2018   HEPBIGM Negative 02/26/2018      Microbiology:  Recent Results (from the past 240 hour(s))  MRSA PCR Screening     Status: Abnormal   Collection Time: 02/26/18  5:39 PM  Result Value Ref Range Status   MRSA by PCR POSITIVE (A) NEGATIVE Final    Comment:        The GeneXpert MRSA Assay (FDA approved for NASAL specimens only), is one component of a comprehensive MRSA colonization surveillance program. It is not intended to diagnose MRSA infection nor to guide or monitor treatment for MRSA infections. CRITICAL RESULT CALLED TO, READ BACK BY AND VERIFIED WITH:  CALLED TO DANA DUGGINS RN @1920  Performed at [facility], [address] [place], [place] [address]   CULTURE, BLOOD (ROUTINE X 2) w Reflex to ID Panel     Status: None (Preliminary result)   Collection Time: 03/06/18  9:01 PM  Result Value Ref Range Status   Specimen Description BLOOD LEFT ANTECUBITAL  Final   Special  Requests   Final    BOTTLES DRAWN AEROBIC ONLY Blood Culture adequate volume   Culture   Final    NO GROWTH 2 DAYS Performed at [facility], [address] [place], [place] [address]    Report Status PENDING  Incomplete  CULTURE, BLOOD (ROUTINE X 2) w Reflex to ID Panel     Status: None (Preliminary result)   Collection Time: 03/06/18  9:37 PM  Result Value Ref Range Status   Specimen Description BLOOD LEFT ANTECUBITAL  Final   Special Requests   Final    BOTTLES DRAWN AEROBIC AND ANAEROBIC Blood Culture adequate volume   Culture   Final    NO GROWTH 2 DAYS Performed at [facility], [address] [place], [place] [address]    Report Status PENDING  Incomplete    Coagulation Studies: No results for input(s): LABPROT, INR in the last 72 hours.  Urinalysis: No results for input(s): COLORURINE, LABSPEC, PHURINE, GLUCOSEU, HGBUR, BILIRUBINUR, KETONESUR, PROTEINUR,  UROBILINOGEN, NITRITE, LEUKOCYTESUR in the last 72 hours.  Invalid input(s): APPERANCEUR    Imaging: Dg Chest 1 View  Result Date: 03/06/2018 CLINICAL DATA:  Shortness of breath.  New PermCath. EXAM: CHEST  1 VIEW COMPARISON:  03/02/2018 FINDINGS: The right IJ PermCath tips are in good position the distal SVC and right atrium. No complicating features. A right-sided PICC line is also in place with its tip near the subclavian jugular junction. The heart is enlarged but stable. There is central vascular congestion, bilateral pleural effusions and bibasilar atelectasis or infiltrates. No overt pulmonary edema. IMPRESSION: Right IJ PermCath in good position without complicating features. The PICC line is also stable. Cardiac enlargement, pleural effusions and bibasilar infiltrates or atelectasis. Moderate central vascular congestion without overt pulmonary edema. Electronically Signed   By: Marijo Sanes M.D.   On: 03/06/2018 16:32   Dg Abd 1 View  Result Date: 03/06/2018 CLINICAL DATA:  Gastrostomy tube obstruction. EXAM: ABDOMEN - 1 VIEW COMPARISON:  KUB 03/06/2018 and 02/27/2018.  Chest x-ray 03/02/2018. FINDINGS: Right PICC line in stable position. Right IJ large caliber central line noted with tip over right atrium. Prior CABG. Cardiomegaly. Bibasilar infiltrates/edema and bilateral pleural effusions again noted. Findings suggest CHF. Bibasilar atelectasis. No pneumothorax. Gastrostomy tube noted. Contrast noted in the stomach, duodenum, and proximal small bowel. IMPRESSION: 1. Right PICC line stable position. Right IJ large caliber central line noted with tip over right atrium. Prior CABG. Cardiomegaly. Bibasilar infiltrates/edema and bilateral pleural effusions. Findings again suggest CHF. Bibasilar atelectasis. 2. Gastrostomy tube noted over the stomach. Contrast noted within the stomach, duodenum, and proximal small bowel. Electronically Signed   By: Marcello Moores  Register   On: 03/06/2018 15:26   Dg  Chest Port 1 View  Result Date: 03/08/2018 CLINICAL DATA:  Acute respiratory failure EXAM: PORTABLE CHEST 1 VIEW COMPARISON:  03/07/2018 FINDINGS: Postoperative changes in the mediastinum. Right central venous dialysis catheter with tip over the right atrium. Right PICC line with tip over the upper SVC region. No pneumothorax. Cardiac enlargement. Bilateral pleural effusions with basilar atelectasis and/or edema. No change since previous study. IMPRESSION: Appliances appear in satisfactory location. Cardiac enlargement with bilateral pleural effusions and basilar atelectasis or edema. Electronically Signed   By: Lucienne Capers M.D.   On: 03/08/2018 03:37   Dg Chest Port 1 View  Result Date: 03/07/2018 CLINICAL DATA:  Respiratory failure EXAM: PORTABLE CHEST 1 VIEW COMPARISON:  March 06, 2018 FINDINGS: Dual lumen catheter tip is at the cavoatrial  junction. Right single-lumen central catheter has its tip in the right innominate vein near the junction with the superior vena cava. There is an apparent skin fold on the left. No pneumothorax is demonstrable. There is cardiomegaly with mild pulmonary venous hypertension. There are pleural effusions bilaterally with mild interstitial edema. There is subtle opacity in the right upper lobe which may represent either alveolar edema or a small area of pneumonitis. There is an old healed fracture of the proximal right humerus. IMPRESSION: Catheter positions unchanged. No evident pneumothorax. There is a skin fold on the left. There is pulmonary vascular congestion with pleural effusions bilaterally and mild interstitial edema. Question mild alveolar edema versus small focus of pneumonia right upper lobe. Electronically Signed   By: Lowella Grip III M.D.   On: 03/07/2018 07:15     Medications:   . ampicillin-sulbactam (UNASYN) IV Stopped (03/07/18 2358)  . feeding supplement (OSMOLITE 1.5 CAL) 20 mL/hr at 03/08/18 0700   . atorvastatin  40 mg Per Tube q1800   . B-complex with vitamin C  1 tablet Per Tube Daily  . chlorhexidine  15 mL Mouth Rinse BID  . clopidogrel  75 mg Per Tube QHS  . collagenase   Topical Daily  . famotidine  10 mg Per Tube Daily  . feeding supplement (PRO-STAT SUGAR FREE 64)  30 mL Per Tube BID  . free water  20 mL Per Tube Q4H  . insulin aspart  0-15 Units Subcutaneous Q4H  . mouth rinse  15 mL Mouth Rinse q12n4p  . metoprolol tartrate      . metoprolol tartrate  12.5 mg Per Tube BID  . multivitamin  15 mL Per Tube QHS  . nutrition supplement (JUVEN)  1 packet Per Tube BID BM  . pantoprazole (PROTONIX) IV  40 mg Intravenous Daily  . polyethylene glycol  17 g Oral Daily   acetaminophen (TYLENOL) oral liquid 160 mg/5 mL **OR** acetaminophen **OR** acetaminophen, albuterol, ipratropium, lip balm, Melatonin, nitroGLYCERIN, ondansetron, promethazine  Assessment/ Plan:  85 y.o. African-American female with multiple comorbidities including diabetes type 2, hypertension, hyperlipidemia, coronary disease status post CABG, history of stroke, CKD, was admitted on 02/26/2018. Originally admitted to [facility] with Achalasia and 50 pound weight loss, dysarthria  In NOV 2019  1.  Acute kidney injury on chronic kidney disease stage III.  Baseline creatinine probably 1.1 from Nov 2019 2.  Malnutrition causing weight loss, now has G-tube 3.  Generalized edema and pulmonary edema 4.  Chronic systolic congestive heart failure EF 30%.  CAD status post CABG 5.  Stroke with left-sided weakness 6.  Uremia, likely causing altered mental status 7.  Enlarging lymph node in umblical hernia  8.  Anemia of CKD. 9.  Hypotension.    Plan:  Patient seen at bedside.  Her mental status has significantly improved over the preceding 24 hours.  Still no measurable urine output at the moment.  We will plan for another dialysis session today with ultrafiltration target of 1.5 kg.  [name] son was updated.  We will need to begin the search  for outpatient hemodialysis unit that will accept the patient.    LOS: 10 Onofre Gains 2/6/202011:25 AM  [place], [place]  Note: This note was prepared with Dragon dictation. Any transcription errors are unintentional

## 2018-03-08 NOTE — Progress Notes (Signed)
Post HD assessment    03/08/18 1752  Neurological  Level of Consciousness Alert  Orientation Level Oriented to person;Disoriented to place;Disoriented to time;Disoriented to situation  Respiratory  Respiratory Pattern Regular;Unlabored  Chest Assessment Chest expansion symmetrical  Cardiac  Pulse Irregular  ECG Monitor Yes  Cardiac Rhythm Atrial fibrillation;ST  Vascular  R Radial Pulse +1  L Radial Pulse +1  Edema Generalized;Right upper extremity;Left upper extremity;Right lower extremity;Left lower extremity  Integumentary  Integumentary (WDL) X  Skin Color Appropriate for ethnicity  Musculoskeletal  Musculoskeletal (WDL) X  Generalized Weakness Yes  Assistive Device None  GU Assessment  Genitourinary (WDL) X  Genitourinary Symptoms  (HD)  Psychosocial  Psychosocial (WDL) X  Patient Behaviors Uncooperative;Restless

## 2018-03-09 LAB — GLUCOSE, CAPILLARY
Glucose-Capillary: 157 mg/dL — ABNORMAL HIGH (ref 70–99)
Glucose-Capillary: 149 mg/dL — ABNORMAL HIGH (ref 70–99)
Glucose-Capillary: 157 mg/dL — ABNORMAL HIGH (ref 70–99)
Glucose-Capillary: 143 mg/dL — ABNORMAL HIGH (ref 70–99)
Glucose-Capillary: 187 mg/dL — ABNORMAL HIGH (ref 70–99)
Glucose-Capillary: 188 mg/dL — ABNORMAL HIGH (ref 70–99)

## 2018-03-09 LAB — BASIC METABOLIC PANEL
Anion gap: 10 (ref 5–15)
BUN: 52 mg/dL — ABNORMAL HIGH (ref 8–23)
CO2: 29 mmol/L (ref 22–32)
Calcium: 8.9 mg/dL (ref 8.9–10.3)
Chloride: 100 mmol/L (ref 98–111)
Creatinine, Ser: 2.16 mg/dL — ABNORMAL HIGH (ref 0.44–1.00)
GFR calc Af Amer: 23 mL/min — ABNORMAL LOW (ref >60)
GFR calc non Af Amer: 20 mL/min — ABNORMAL LOW (ref >60)
Glucose, Bld: 189 mg/dL — ABNORMAL HIGH (ref 70–99)
Potassium: 3.8 mmol/L (ref 3.5–5.1)
Sodium: 139 mmol/L (ref 135–145)

## 2018-03-09 LAB — PROCALCITONIN: Procalcitonin: 1.61 ng/mL

## 2018-03-09 MED ORDER — STERILE WATER FOR INJECTION IJ SOLN
INTRAMUSCULAR | Status: AC
  Administered 2018-03-09: 10 mL
  Filled 2018-03-09: qty 10

## 2018-03-09 NOTE — Progress Notes (Signed)
Pharmacy Antibiotic Note  [name] is a 85 y.o. female admitted on 02/26/2018 with acute kidney injury and requiring dialysis. Patient admitted to ICU on 2/4 due to rapid response. Pharmacy has been consulted for Unasyn dosing for possible aspiration pneumonia.  Plan: Continue Unasyn 3g IV Q12hr.   Height: 5\' 3"  (160 cm) Weight: 177 lb 0.5 oz (80.3 kg) IBW/kg (Calculated) : 52.4  Temp (24hrs), Avg:97 F (36.1 C), Min:96 F (35.6 C), Max:97.8 F (36.6 C)  Recent Labs  Lab 03/04/18 0812 03/05/18 0445 03/06/18 1625 03/07/18 0425 03/08/18 0255 03/09/18 0953  WBC 8.3 9.6 8.0 8.6 7.6  --   CREATININE 1.94* 2.38*  --  2.11* 2.77* 2.16*    Estimated Creatinine Clearance: 18.1 mL/min (A) (by C-G formula based on SCr of 2.16 mg/dL (H)).    Allergies  Allergen Reactions  . Reglan [Metoclopramide]     Pt's son reports this causes patient tremors and neurologic issues   . Contrast Media [Iodinated Diagnostic Agents]     Sensitivity, kidney issues    Antimicrobials this admission: Unasyn 2/5  >>   Dose adjustments this admission: N/A  Microbiology results: 2/4 BCx: no growth x 3 days  1/27 MRSA PCR: positive   Thank you for allowing pharmacy to be a part of this patient's care.  Simpson,Michael L 03/09/2018 11:38 AM

## 2018-03-09 NOTE — Progress Notes (Signed)
[name], [place] 03/09/18  Subjective:  Patient resting comfortably in bed. Tolerated dialysis well yesterday. Next dialysis scheduled for tomorrow.   Objective:  Vital signs in last 24 hours:  Temp:  [96 F (35.6 C)-97.8 F (36.6 C)] 97.3 F (36.3 C) (02/07 0849) Pulse Rate:  [53-110] 101 (02/07 1000) Resp:  [9-42] 33 (02/07 1000) BP: (92-138)/(47-94) 118/70 (02/07 1000) SpO2:  [92 %-100 %] 96 % (02/07 1000) Weight:  [80.2 kg-82.3 kg] 80.3 kg (02/07 0500)  Weight change: 8.2 kg Filed Weights   03/08/18 1404 03/08/18 1754 03/09/18 0500  Weight: 82.3 kg 80.2 kg 80.3 kg    Intake/Output:    Intake/Output Summary (Last 24 hours) at 03/09/2018 1035 Last data filed at 03/09/2018 0900 Gross per 24 hour  Intake 786.45 ml  Output 1502 ml  Net -715.55 ml     Physical Exam: General:  Critically ill-appearing  HEENT  dry oral mucous membranes  Neck  supple  Pulm/lungs  Scattered rhonchi, normal effort  CVS/Heart  no rub, irregular  Abdomen:   Soft, nontender, nondistended  Extremities:  2+ pitting edema   Neurologic:  Awake, oriented to self only  Skin:  No acute rashes  Access:  Right IJ permcath        Basic Metabolic Panel:  Recent Labs  Lab 03/04/18 0812 03/05/18 0445 03/06/18 1308 03/07/18 0425 03/08/18 0255 03/08/18 1454 03/09/18 0953  NA 137 140  --  140 138  --  139  K 3.8 4.3  --  3.6 3.8  --  3.8  CL 101 103  --  107 101  --  100  CO2 30 29  --  27 27  --  29  GLUCOSE 171* 214*  --  106* 258*  --  189*  BUN 46* 66*  --  58* 68*  --  52*  CREATININE 1.94* 2.38*  --  2.11* 2.77*  --  2.16*  CALCIUM 8.6* 9.1  --  8.1* 8.7*  --  8.9  PHOS  --   --  3.0  --   --  3.2  --      CBC: Recent Labs  Lab 03/04/18 0812 03/05/18 0445 03/06/18 1625 03/07/18 0425 03/08/18 0255  WBC 8.3 9.6 8.0 8.6 7.6  HGB 7.7* 7.7* 7.8* 7.9* 8.1*  HCT 25.6* 25.9* 26.6* 26.5* 27.5*  MCV 100.0 99.6 101.5* 100.8* 101.9*  PLT 227 222 177  164 158      Lab Results  Component Value Date   HEPBSAG Negative 02/26/2018   HEPBSAB Non Reactive 02/26/2018   HEPBIGM Negative 02/26/2018      Microbiology:  Recent Results (from the past 240 hour(s))  CULTURE, BLOOD (ROUTINE X 2) w Reflex to ID Panel     Status: None (Preliminary result)   Collection Time: 03/06/18  9:01 PM  Result Value Ref Range Status   Specimen Description BLOOD LEFT ANTECUBITAL  Final   Special Requests   Final    BOTTLES DRAWN AEROBIC ONLY Blood Culture adequate volume   Culture   Final    NO GROWTH 3 DAYS Performed at [facility], [address] [place], [place] [address]    Report Status PENDING  Incomplete  CULTURE, BLOOD (ROUTINE X 2) w Reflex to ID Panel     Status: None (Preliminary result)   Collection Time: 03/06/18  9:37 PM  Result Value Ref Range Status   Specimen Description BLOOD LEFT ANTECUBITAL  Final   Special  Requests   Final    BOTTLES DRAWN AEROBIC AND ANAEROBIC Blood Culture adequate volume   Culture   Final    NO GROWTH 3 DAYS Performed at [facility], [address] [place], [place] [address]    Report Status PENDING  Incomplete    Coagulation Studies: No results for input(s): LABPROT, INR in the last 72 hours.  Urinalysis: No results for input(s): COLORURINE, LABSPEC, PHURINE, GLUCOSEU, HGBUR, BILIRUBINUR, KETONESUR, PROTEINUR, UROBILINOGEN, NITRITE, LEUKOCYTESUR in the last 72 hours.  Invalid input(s): APPERANCEUR    Imaging: Dg Chest Port 1 View  Result Date: 03/08/2018 CLINICAL DATA:  Acute respiratory failure EXAM: PORTABLE CHEST 1 VIEW COMPARISON:  03/07/2018 FINDINGS: Postoperative changes in the mediastinum. Right central venous dialysis catheter with tip over the right atrium. Right PICC line with tip over the upper SVC region. No pneumothorax. Cardiac enlargement. Bilateral pleural effusions with basilar atelectasis and/or edema. No change since previous study. IMPRESSION: Appliances  appear in satisfactory location. Cardiac enlargement with bilateral pleural effusions and basilar atelectasis or edema. Electronically Signed   By: Lucienne Capers M.D.   On: 03/08/2018 03:37     Medications:   . albumin human 25 g (03/08/18 1453)  . ampicillin-sulbactam (UNASYN) IV Stopped (03/09/18 0143)  . feeding supplement (OSMOLITE 1.5 CAL) 1,000 mL (03/08/18 2230)   . atorvastatin  40 mg Per Tube q1800  . B-complex with vitamin C  1 tablet Per Tube Daily  . chlorhexidine  15 mL Mouth Rinse BID  . clopidogrel  75 mg Per Tube QHS  . collagenase   Topical Daily  . famotidine  10 mg Per Tube Daily  . feeding supplement (PRO-STAT SUGAR FREE 64)  30 mL Per Tube BID  . free water  20 mL Per Tube Q4H  . insulin aspart  0-9 Units Subcutaneous Q4H  . mouth rinse  15 mL Mouth Rinse q12n4p  . metoprolol tartrate  12.5 mg Per Tube BID  . multivitamin  15 mL Per Tube QHS  . nutrition supplement (JUVEN)  1 packet Per Tube BID BM  . pantoprazole (PROTONIX) IV  40 mg Intravenous Daily  . polyethylene glycol  17 g Oral Daily   acetaminophen (TYLENOL) oral liquid 160 mg/5 mL **OR** acetaminophen **OR** acetaminophen, albumin human, albuterol, ipratropium, lip balm, Melatonin, nitroGLYCERIN, ondansetron, promethazine  Assessment/ Plan:  85 y.o. African-American female with multiple comorbidities including diabetes type 2, hypertension, hyperlipidemia, coronary disease status post CABG, history of stroke, CKD, was admitted on 02/26/2018. Originally admitted to [facility] with Achalasia and 50 pound weight loss, dysarthria  In NOV 2019  1.  Acute kidney injury on chronic kidney disease stage III.  Baseline creatinine probably 1.1 from Nov 2019 2.  Malnutrition causing weight loss, now has G-tube 3.  Generalized edema and pulmonary edema 4.  Chronic systolic congestive heart failure EF 30%.  CAD status post CABG 5.  Stroke with left-sided weakness 6.  Uremia, likely causing altered  mental status 7.  Enlarging lymph node in umblical hernia  8.  Anemia of CKD. 9.  Hypotension.    Plan:  Nursing continues to report that they do not detect any urine in the patient's diapers.  Therefore patient remains dialysis dependent at this time.  We will plan for hemodialysis again tomorrow with ultrafiltration target of 1.5 to 2 kg.  Mental status similar to what it was yesterday.  Oriented only to self.  Patient to be transitioned floor care today.    LOS:  [place] 2/7/202010:35 AM  [place], [place]  Note: This note was prepared with Dragon dictation. Any transcription errors are unintentional

## 2018-03-09 NOTE — Progress Notes (Signed)
[place] [facility] [place] NAME: [name]    MR#:  [id number]  DATE OF BIRTH:  [birth year]  SUBJECTIVE:  patient got transferred out of the ICU. He is awake but not answering many questions today. No family in the room. Received dialysis yesterday.  REVIEW OF SYSTEMS:   Review of Systems  Constitutional: Negative for chills, fever and weight loss.  HENT: Negative for ear discharge, ear pain and nosebleeds.   Eyes: Negative for blurred vision, pain and discharge.  Respiratory: Negative for sputum production, shortness of breath, wheezing and stridor.   Cardiovascular: Negative for chest pain, palpitations, orthopnea and PND.  Gastrointestinal: Negative for abdominal pain, diarrhea, nausea and vomiting.  Genitourinary: Negative for frequency and urgency.  Musculoskeletal: Negative for back pain and joint pain.  Neurological: Positive for weakness. Negative for sensory change, speech change and focal weakness.  Psychiatric/Behavioral: Negative for depression and hallucinations. The patient is not nervous/anxious.      DRUG ALLERGIES:   Allergies  Allergen Reactions  . Reglan [Metoclopramide]     Pt's son reports this causes patient tremors and neurologic issues   . Contrast Media [Iodinated Diagnostic Agents]     Sensitivity, kidney issues    VITALS:  Blood pressure 114/75, pulse (!) 101, temperature (!) 97.3 F (36.3 C), temperature source Axillary, resp. rate 15, height 5\' 3"  (1.6 m), weight 80.3 kg, SpO2 95 %.  PHYSICAL EXAMINATION:   Physical Exam limited exam   GENERAL:  85 y.o.-year-old patient lying in the bed with no acute distress. chronically ill. Anasarca EYES: Pupils equal, round, reactive to light and accommodation. No scleral icterus. Extraocular muscles intact.  HEENT: Head atraumatic, normocephalic. Oropharynx and nasopharynx clear.  NECK:  Supple, no jugular venous distention. No thyroid enlargement, no  tenderness.  LUNGS: Normal breath sounds bilaterally, no wheezing, rales, rhonchi. No use of accessory muscles of respiration. Perm Cath + CARDIOVASCULAR: S1, S2 normal. No murmurs, rubs, or gallops.  ABDOMEN: Soft, nontender, nondistended. Bowel sounds present. No organomegaly or mass. PEG site ok EXTREMITIES: significant edema NEUROLOGIC: very limited. Patient follow limited commands.  PSYCHIATRIC:  patient more awake today  LABORATORY PANEL:  CBC Recent Labs  Lab 03/08/18 0255  WBC 7.6  HGB 8.1*  HCT 27.5*  PLT 158    Chemistries  Recent Labs  Lab 03/07/18 0425  03/09/18 0953  NA 140   < > 139  K 3.6   < > 3.8  CL 107   < > 100  CO2 27   < > 29  GLUCOSE 106*   < > 189*  BUN 58*   < > 52*  CREATININE 2.11*   < > 2.16*  CALCIUM 8.1*   < > 8.9  AST 248*  --   --   ALT 298*  --   --   ALKPHOS 144*  --   --   BILITOT 0.8  --   --    < > = values in this interval not displayed.   Cardiac Enzymes Recent Labs  Lab 03/08/18 2144  TROPONINI 1.07*   RADIOLOGY:  Dg Chest Port 1 View  Result Date: 03/08/2018 CLINICAL DATA:  Acute respiratory failure EXAM: PORTABLE CHEST 1 VIEW COMPARISON:  03/07/2018 FINDINGS: Postoperative changes in the mediastinum. Right central venous dialysis catheter with tip over the right atrium. Right PICC line with tip over the upper SVC region. No pneumothorax. Cardiac enlargement. Bilateral pleural effusions with basilar atelectasis and/or edema.  No change since previous study. IMPRESSION: Appliances appear in satisfactory location. Cardiac enlargement with bilateral pleural effusions and basilar atelectasis or edema. Electronically Signed   By: Lucienne Capers M.D.   On: 03/08/2018 03:37   ASSESSMENT AND PLAN:  [name] is an85 y.o.femalewith multiple comorbidities including diabetes type 2, hypertension, hyperlipidemia, coronary disease status post CABG, history of stroke, and CKD who was admitted on1/27/2020. [facility] 50 pound weight [facility] in 12/2017.Status post treatment for ESBL Klebsiella aspiration pneumonia, enterococcus UTI. Admitted for dialysis.  1. Acute kidney injury on chronic kidney disease stage III. Uremia, and multiple other comorbidities likely causing altered mental status/acute metabolic encephalopathy - complicated by CHF, poor nutrition, cardio renal syndrome. - Nephrologyfollowing; patient receiveddialysis treatment almost daily.Urine output low. Willcontinue tomonitor patient's ability to tolerate due to her frail status and critical illness -patient did not get dialysis yesterday due to low blood pressure--BP better now getting hd TODAY -pt has received IV albumin during dialysis 03/02/18 - s/p permacath.  2.Acute on chronic mild systolic, diastolic congestive heart failure with moderate MR and bilateral pleural effusion with and Anasarca and hypoalbuminemia, pending improvement with HD -Holding isosorbide-hydralazine, patient not hypertensive on single agent at this time with low diastolic pressures - small dose metoprolol -patient had volume overload and went into trans in a fib secondary to unable to do dialysis due to low blood pressure -dialysis resumed 03/08/2018-- 1.5 L was removed yesterday. -Patient was started on empiric antibiotic for questionable aspiration. Her white count is normal. No fever. Pro calcitonin trending down from before. I will discontinue unasyn that was started on 03/07/2018 in the ICU by NP  3.Dysphagia secondary to achalasia. Patient failed Botox and status post peg tube feeding - patient has been having symptoms of vomiting. She has been getting tube feeding at 20 mL an hour. -recs forTube feeding per nutrition, in place per GI c/s - daughter requested 03/02/2018 to stop all tube feeds for concern of aspiration on emesis --- G.I. be consulted. Dr. Ricky Stabs -peg tube seems to be flushed well and  working.  4.Type II diabetes -sliding scale insulin  5.History of left temporal and frontal punctate CVA -on Plavix and Lipitor  6.Status post non-STEMI -- continue Plavix and statins  7. Lymph node within umbilical hernia, son declined core biopsyaccording to [facility] clinicalnotesGeneral surgery [facility], no further recommendations at this time.-- Son has declined further workup according to [facility] cone notes  8. Deconditioning, frailty  - PTtoevaluateand treatment ordered, as tolerated Olso need to sit in chair for Out pt HD>  9.  Transient Afib int e setting of volume overlaod -now in sinus rhythm. Seen by cardiology Dr. Fletcher Anon. Cardiology has signed off. -They do not recommend any anticoagulation no further cardiac workup  CODE STATUS: full  DVT Prophylaxis: family has declined to use heparin  TOTAL TIME TAKING CARE OF THIS PATIENT: 25 minutes.  >50% time spent on counselling and coordination of care  Note: This dictation was prepared with Dragon dictation along with smaller phrase technology. Any transcriptional errors that result from this process are unintentional.  Fritzi Mandes M.D on 03/09/2018 at 3:16 PM  Between 7am to 6pm - Pager - [phone]  After 6pm go to [email or web address] - password [email or web address] [facility]  Office  [phone]  CC: Primary care physician; Deland Pretty, MDPatient ID: Verlon Au, female   DOB: 02/15/1927, 85 y.o.   MRN: [id number]

## 2018-03-09 NOTE — Progress Notes (Signed)
Spoke to patient's daughter Jeani Hawking and updated her on patient's care and that patient has moved to room 221.

## 2018-03-09 NOTE — Progress Notes (Signed)
CRITICAL CARE NOTE  CC  Lethargy, hypoxia   SUBJECTIVE Son identified himself as [name] at bedside this a.m. I had reports that he has been abrasive towards nursing staff and students in room. During my evaluation patient states year is 36, president is Janeice Robinson, season is summer. Son was disrespectful and was pointing fingers in my face.  Son states that patient's mental status is normal and denies any cognitive impairment.  I had explained that I am performing a routine evaluation and we will continue to respect his wishes as next of kin.  Very unpleasant interaction overall.    Patient resting comfortably in bed.    SIGNIFICANT EVENTS Optimizing for d/c from MICU today for transfer to floor.    Vitals:   03/09/18 0900 03/09/18 1000  BP: 110/75 118/70  Pulse: 94 (!) 101  Resp: 18 (!) 33  Temp:    SpO2: 95% 96%     REVIEW OF SYSTEMS  PATIENT IS UNABLE TO PROVIDE COMPLETE REVIEW OF SYSTEMS DUE TO perceived dementia  PHYSICAL EXAMINATION:  GENERAL: Confused HEAD: Normocephalic, atraumatic.  EYES: Pupils equal, round, reactive to light.  No scleral icterus.  MOUTH: Moist mucosal membrane. NECK: Supple. No thyromegaly. No nodules. No JVD.  PULMONARY: Mild crackles at bases CARDIOVASCULAR: S1 and S2. Regular rate and rhythm. No murmurs, rubs, or gallops.  GASTROINTESTINAL: Soft, nontender, nondistended-distended. No masses. Positive bowel sounds. No hepatosplenomegaly.  MUSCULOSKELETAL: No swelling, clubbing, or edema.  NEUROLOGIC: Mild distress due to acute illness SKIN:intact,warm,dry   Labs and Imaging:     -I personally reviewed most recent blood work, imaging and microbiology - significant findings today are CKD with mild hyperglycemia, CBC with anemia  LAB RESULTS: Recent Labs  Lab 03/07/18 0425 03/08/18 0255 03/09/18 0953  NA 140 138 139  K 3.6 3.8 3.8  CL 107 101 100  CO2 27 27 29   BUN 58* 68* 52*  CREATININE 2.11* 2.77* 2.16*  GLUCOSE 106*  258* 189*   Recent Labs  Lab 03/06/18 1625 03/07/18 0425 03/08/18 0255  HGB 7.8* 7.9* 8.1*  HCT 26.6* 26.5* 27.5*  WBC 8.0 8.6 7.6  PLT 177 164 158     IMAGING RESULTS: No results found.   ASSESSMENT AND PLAN SYNOPSIS  -Multidisciplinary rounds held today  AcuteHypoxic Respiratory Failure -Resolved - room air -High risk for recurrent atelectasis -Recommend PT if patient is able to participate  Altered mental status-lethargy -Status post renal eval, RRT  -Appears to have baseline dementia based on bedside evaluation history of CVA/advanced age   CARDIAC FAILURE- -blood pressure stable-will DC to medical floor -transient hypotension post hemodialysis-initially BP 72/25-status post HD today -Complicated by heart failure with reduced EF at 30% with grade 2 diastolic dysfunction. -BP improved   Multiple comorbid conditions including but not limited toDementia, CVA dyshpagia with PEG tube, achalasia ,abdominal lymphadenopathy concerning for malignancy -Complicating comorbiditiesand concern for coagulopathy secondary to neoplasm,overall poor prognosis -Palliative consultation-discussed with palliative NP today-full code measures as patient's son requests    Renal Failure- -management per renal team   NEUROLOGY -Acute lethargyimproving now with confusion unknown Suspect dementia at baseline,history of CVA with advanced age   GI/Nutrition GI PROPHYLAXIS as indicated-protonix40 daily DIET-->dysphagia, achalasia, PEG tube- D5NS at 30 mL's per hour for now with nutritional consultationadditional recommendations  constipation protocol as indicated  ENDO - ICU hypoglycemic\Hyperglycemia protocol -check FSBS per protocol   ELECTROLYTES -follow labs as needed -replace as needed -pharmacy consultation and following   DVT/GI PRX ordered TRANSFUSIONS  AS NEEDED MONITOR FSBS ASSESS the need for LABS as needed  Critical care provider  statement:   Critical care time (minutes): 0  Critical care time was exclusive of: Separately billable procedures and  treating other patients  Critical care was necessary to treat or prevent imminent or  life-threatening deterioration of the following conditions:  Lethargy, acute on chronic kidney disease, acute hypoxia secondary to pleural effusions with atelectasis, transient hypotension  Critical care was time spent personally by me on the following  activities: Development of treatment plan with patient or surrogate,  discussions with consultants, evaluation of patient's response to  treatment, examination of patient, obtaining history from patient or  surrogate, ordering and performing treatments and interventions, ordering  and review of laboratory studies and re-evaluation of patient's condition  I assumed direction of critical care for this patient from another  provider in my specialty: no   Ottie Glazier, M.D.  [facility]

## 2018-03-10 LAB — GLUCOSE, CAPILLARY
GLUCOSE-CAPILLARY: 167 mg/dL — AB (ref 70–99)
Glucose-Capillary: 172 mg/dL — ABNORMAL HIGH (ref 70–99)
Glucose-Capillary: 168 mg/dL — ABNORMAL HIGH (ref 70–99)
Glucose-Capillary: 170 mg/dL — ABNORMAL HIGH (ref 70–99)
Glucose-Capillary: 171 mg/dL — ABNORMAL HIGH (ref 70–99)

## 2018-03-10 LAB — PHOSPHORUS: Phosphorus: 1.7 mg/dL — ABNORMAL LOW (ref 2.5–4.6)

## 2018-03-10 NOTE — Progress Notes (Signed)
[facility], [place] 03/10/18  Subjective:  Patient had vomiting this a.m. Also had a bowel movement. Due for additional dialysis treatment today.   Objective:  Vital signs in last 24 hours:  Temp:  [97.4 F (36.3 C)-97.6 F (36.4 C)] 97.4 F (36.3 C) (02/08 1135) Pulse Rate:  [92-103] 103 (02/08 1135) Resp:  [18-20] 18 (02/08 1135) BP: (95-123)/(61-80) 123/80 (02/08 1135) SpO2:  [95 %-100 %] 99 % (02/08 1135) Weight:  [77 kg] 77 kg (02/08 0432)  Weight change: -5.279 kg Filed Weights   03/08/18 1754 03/09/18 0500 03/10/18 0432  Weight: 80.2 kg 80.3 kg 77 kg    Intake/Output:    Intake/Output Summary (Last 24 hours) at 03/10/2018 1237 Last data filed at 03/10/2018 0313 Gross per 24 hour  Intake 268.78 ml  Output -  Net 268.78 ml     Physical Exam: General:  No acute distress  HEENT  moist oral mucous membranes  Neck  supple  Pulm/lungs  Scattered rhonchi, normal effort  CVS/Heart  no rub, irregular  Abdomen:   Soft, nontender, nondistended  Extremities:  2+ pitting edema   Neurologic:  Awake, oriented to self only  Skin:  No acute rashes  Access:  Right IJ permcath        Basic Metabolic Panel:  Recent Labs  Lab 03/04/18 0812 03/05/18 0445 03/06/18 1308 03/07/18 0425 03/08/18 0255 03/08/18 1454 03/09/18 0953  NA 137 140  --  140 138  --  139  K 3.8 4.3  --  3.6 3.8  --  3.8  CL 101 103  --  107 101  --  100  CO2 30 29  --  27 27  --  29  GLUCOSE 171* 214*  --  106* 258*  --  189*  BUN 46* 66*  --  58* 68*  --  52*  CREATININE 1.94* 2.38*  --  2.11* 2.77*  --  2.16*  CALCIUM 8.6* 9.1  --  8.1* 8.7*  --  8.9  PHOS  --   --  3.0  --   --  3.2  --      CBC: Recent Labs  Lab 03/04/18 0812 03/05/18 0445 03/06/18 1625 03/07/18 0425 03/08/18 0255  WBC 8.3 9.6 8.0 8.6 7.6  HGB 7.7* 7.7* 7.8* 7.9* 8.1*  HCT 25.6* 25.9* 26.6* 26.5* 27.5*  MCV 100.0 99.6 101.5* 100.8* 101.9*  PLT 227 222 177 164 158      Lab  Results  Component Value Date   HEPBSAG Negative 02/26/2018   HEPBSAB Non Reactive 02/26/2018   HEPBIGM Negative 02/26/2018      Microbiology:  Recent Results (from the past 240 hour(s))  CULTURE, BLOOD (ROUTINE X 2) w Reflex to ID Panel     Status: None (Preliminary result)   Collection Time: 03/06/18  9:01 PM  Result Value Ref Range Status   Specimen Description BLOOD LEFT ANTECUBITAL  Final   Special Requests   Final    BOTTLES DRAWN AEROBIC ONLY Blood Culture adequate volume   Culture   Final    NO GROWTH 4 DAYS Performed at [facility], [address] [place], [place] [address]    Report Status PENDING  Incomplete  CULTURE, BLOOD (ROUTINE X 2) w Reflex to ID Panel     Status: None (Preliminary result)   Collection Time: 03/06/18  9:37 PM  Result Value Ref Range Status   Specimen Description BLOOD LEFT ANTECUBITAL  Final  Special Requests   Final    BOTTLES DRAWN AEROBIC AND ANAEROBIC Blood Culture adequate volume   Culture   Final    NO GROWTH 4 DAYS Performed at [facility], [address] [place], [place] [address]    Report Status PENDING  Incomplete    Coagulation Studies: No results for input(s): LABPROT, INR in the last 72 hours.  Urinalysis: No results for input(s): COLORURINE, LABSPEC, PHURINE, GLUCOSEU, HGBUR, BILIRUBINUR, KETONESUR, PROTEINUR, UROBILINOGEN, NITRITE, LEUKOCYTESUR in the last 72 hours.  Invalid input(s): APPERANCEUR    Imaging: No results found.   Medications:   . albumin human 25 g (03/08/18 1453)  . feeding supplement (OSMOLITE 1.5 CAL) 1,000 mL (03/08/18 2230)   . atorvastatin  40 mg Per Tube q1800  . B-complex with vitamin C  1 tablet Per Tube Daily  . chlorhexidine  15 mL Mouth Rinse BID  . clopidogrel  75 mg Per Tube QHS  . collagenase   Topical Daily  . famotidine  10 mg Per Tube Daily  . feeding supplement (PRO-STAT SUGAR FREE 64)  30 mL Per Tube BID  . free water  20 mL Per Tube Q4H  .  insulin aspart  0-9 Units Subcutaneous Q4H  . mouth rinse  15 mL Mouth Rinse q12n4p  . metoprolol tartrate  12.5 mg Per Tube BID  . multivitamin  15 mL Per Tube QHS  . nutrition supplement (JUVEN)  1 packet Per Tube BID BM  . pantoprazole (PROTONIX) IV  40 mg Intravenous Daily  . polyethylene glycol  17 g Oral Daily   acetaminophen (TYLENOL) oral liquid 160 mg/5 mL **OR** acetaminophen **OR** acetaminophen, albumin human, albuterol, ipratropium, lip balm, Melatonin, nitroGLYCERIN, ondansetron, promethazine  Assessment/ Plan:  85 y.o. African-American female with multiple comorbidities including diabetes type 2, hypertension, hyperlipidemia, coronary disease status post CABG, history of stroke, CKD, was admitted on 02/26/2018. Originally admitted to [facility] [place] with Achalasia and 50 pound weight loss, dysarthria  In NOV 2019  1.  Acute kidney injury on chronic kidney disease stage III.  Baseline creatinine probably 1.1 from Nov 2019 2.  Malnutrition causing weight loss, now has G-tube 3.  Generalized edema and pulmonary edema 4.  Chronic systolic congestive heart failure EF 30%.  CAD status post CABG 5.  Stroke with left-sided weakness 6.  Uremia, likely causing altered mental status 7.  Enlarging lymph node in umblical hernia  8.  Anemia of CKD. 9.  Hypotension.    Plan:  Patient due for hemodialysis today.  Orders have been prepared.  Ultrafiltration target 1.5 to 2 kg.  Overall still quite debilitated however.  For now we will continue dialysis with ultrafiltration.  Further plan as patient progresses.    LOS: 12 [name] 2/8/202012:37 PM  [place], [place]  Note: This note was prepared with Dragon dictation. Any transcription errors are unintentional

## 2018-03-10 NOTE — Progress Notes (Signed)
[place] [facility] [place] NAME: [name]    MR#:  [id number]  DATE OF BIRTH:  1927/10/18  SUBJECTIVE:  Patient awake and alert unable to obtain any review of systems  REVIEW OF SYSTEMS:   unable to obtain from patient   DRUG ALLERGIES:   Allergies  Allergen Reactions  . Reglan [Metoclopramide]     Pt's son reports this causes patient tremors and neurologic issues   . Contrast Media [Iodinated Diagnostic Agents]     Sensitivity, kidney issues    VITALS:  Blood pressure 123/80, pulse (!) 103, temperature (!) 97.4 F (36.3 C), resp. rate 18, height 5\' 3"  (1.6 m), weight 77 kg, SpO2 99 %.  PHYSICAL EXAMINATION:   Physical Exam   GENERAL:  85 y.o.-year-old patient lying in the bed with no acute distress. chronically ill. Anasarca EYES: Pupils equal, round, reactive to light and accommodation. No scleral icterus. Extraocular muscles intact.  HEENT: Head atraumatic, normocephalic. Oropharynx and nasopharynx clear.  NECK:  Supple, no jugular venous distention. No thyroid enlargement, no tenderness.  LUNGS: Normal breath sounds bilaterally, no wheezing, rales, rhonchi. No use of accessory muscles of respiration. Perm Cath + CARDIOVASCULAR: S1, S2 normal. No murmurs, rubs, or gallops.  ABDOMEN: Soft, nontender, nondistended. Bowel sounds present. No organomegaly or mass. PEG palced EXTREMITIES: 3+ LEE NEUROLOGIC: very limited.   PSYCHIATRIC:  Awake does not follow commands very well  LABORATORY PANEL:  CBC Recent Labs  Lab 03/08/18 0255  WBC 7.6  HGB 8.1*  HCT 27.5*  PLT 158    Chemistries  Recent Labs  Lab 03/07/18 0425  03/09/18 0953  NA 140   < > 139  K 3.6   < > 3.8  CL 107   < > 100  CO2 27   < > 29  GLUCOSE 106*   < > 189*  BUN 58*   < > 52*  CREATININE 2.11*   < > 2.16*  CALCIUM 8.1*   < > 8.9  AST 248*  --   --   ALT 298*  --   --   ALKPHOS 144*  --   --   BILITOT 0.8  --   --    < > = values in this  interval not displayed.   Cardiac Enzymes Recent Labs  Lab 03/08/18 2144  TROPONINI 1.07*   RADIOLOGY:  No results found. ASSESSMENT AND PLAN:  [name] is an85 y.o.femalewith multiple comorbidities including diabetes type 2, hypertension, hyperlipidemia, coronary disease status post CABG, history of stroke, and CKD who was admitted on1/27/2020. [facility] [place]and 50 pound weight loss in 12/2017.Status post treatment for ESBL Klebsiella aspiration pneumonia, enterococcus UTI. Admitted for dialysis.  1. Acute kidney injury on chronic kidney disease stage III. Uremia, and multiple other comorbidities likely causing altered mental status/acute metabolic encephalopathy - complicated by CHF, poor nutrition, cardio renal syndrome. - Nephrologyfollowing; Dialysis as per nephrology - s/p permacath.  2.Acute on chronic mild systolic, diastolic congestive heart failure with moderate MR and bilateral pleural effusion with and Anasarca and hypoalbuminemia Improving with dialysis Continue low-dose metoprolol   3.Dysphagia secondary to achalasia.  Continue PEG feedings as tolerated  4.Type II diabetes -sliding scale insulin  5.History of left temporal and frontal punctate CVA -on Plavix and Lipitor  6.Status post non-STEMI -- continue Plavix and statins  7. Lymph node within umbilical hernia, son declined core biopsyaccording to  clinicalnotesGeneral surgery  [facility], no further recommendations at this time.-- Son has declined further workup according to [facility] cone notes  8. Deconditioning, frailty  - PTtoevaluateand treatment ordered, as tolerated [place] need to sit in chair for Out pt HD>  9.  Transient Afib int e setting of volume overlaod -now in sinus rhythm. Seen by cardiology Dr. Fletcher Anon. Cardiology has signed off. -They do not recommend any anticoagulation no further  cardiac workup  CODE STATUS: full  DVT Prophylaxis: family has declined to use heparin  TOTAL TIME TAKING CARE OF THIS PATIENT: 25 minutes.  >50% time spent on counselling and coordination of care  Note: This dictation was prepared with Dragon dictation along with smaller phrase technology. Any transcriptional errors that result from this process are unintentional.  Heylee Tant M.D on 03/10/2018 at 12:36 PM  Between 7am to 6pm - Pager - [phone]  After 6pm go to [email or web address] - password [email or web address] [facility]  Office  [phone]  CC: Primary care physician; Deland Pretty, MDPatient ID: [name], female   DOB: [birth year], 85 y.o.   MRN: [id number]

## 2018-03-11 LAB — CULTURE, BLOOD (ROUTINE X 2)
Culture: NO GROWTH
Culture: NO GROWTH
Special Requests: ADEQUATE
Special Requests: ADEQUATE

## 2018-03-11 LAB — BASIC METABOLIC PANEL
ANION GAP: 9 (ref 5–15)
BUN: 53 mg/dL — ABNORMAL HIGH (ref 8–23)
CO2: 31 mmol/L (ref 22–32)
Calcium: 8.7 mg/dL — ABNORMAL LOW (ref 8.9–10.3)
Chloride: 98 mmol/L (ref 98–111)
Creatinine, Ser: 1.85 mg/dL — ABNORMAL HIGH (ref 0.44–1.00)
GFR calc Af Amer: 28 mL/min — ABNORMAL LOW (ref >60)
GFR calc non Af Amer: 24 mL/min — ABNORMAL LOW (ref >60)
Glucose, Bld: 197 mg/dL — ABNORMAL HIGH (ref 70–99)
Potassium: 3.8 mmol/L (ref 3.5–5.1)
Sodium: 138 mmol/L (ref 135–145)

## 2018-03-11 LAB — GLUCOSE, CAPILLARY
Glucose-Capillary: 92 mg/dL (ref 70–99)
Glucose-Capillary: 147 mg/dL — ABNORMAL HIGH (ref 70–99)
Glucose-Capillary: 143 mg/dL — ABNORMAL HIGH (ref 70–99)
Glucose-Capillary: 170 mg/dL — ABNORMAL HIGH (ref 70–99)
Glucose-Capillary: 178 mg/dL — ABNORMAL HIGH (ref 70–99)
Glucose-Capillary: 166 mg/dL — ABNORMAL HIGH (ref 70–99)

## 2018-03-11 LAB — TROPONIN I
Troponin I: 1 ng/mL (ref <0.03)
Troponin I: 0.9 ng/mL (ref <0.03)
Troponin I: 0.91 ng/mL (ref <0.03)

## 2018-03-11 NOTE — Progress Notes (Signed)
Pt. said " my heart is hurting" and pointing to her chest.  Pt . Respiration went up to 30 when awake and goes back to normal when fall asleep. She c/o of heart hurting after we bathed her. So, it seems that exertion or  movement really put the patient in distress.MD was notified and ordered troponin lab.

## 2018-03-11 NOTE — Progress Notes (Signed)
Pt. appears to have bloody,mucousy,foul smelling vaginal discharged. Rounding MD was notified via secure chat.

## 2018-03-11 NOTE — Progress Notes (Signed)
[facility] NAME: [name]    MR#:  [id number]  DATE OF BIRTH:  07-03-1927  SUBJECTIVE:  Patient awake and alert   Son at bedside  REVIEW OF SYSTEMS:   unable to obtain from patient   DRUG ALLERGIES:   Allergies  Allergen Reactions  . Reglan [Metoclopramide]     Pt's son reports this causes patient tremors and neurologic issues   . Contrast Media [Iodinated Diagnostic Agents]     Sensitivity, kidney issues    VITALS:  Blood pressure 113/67, pulse 91, temperature (!) 97.5 F (36.4 C), temperature source Oral, resp. rate 19, height 5\' 3"  (1.6 m), weight 75.3 kg, SpO2 97 %.  PHYSICAL EXAMINATION:   Physical Exam   GENERAL:  85 y.o.-year-old patient lying in the bed with no acute distress. chronically ill. Anasarca EYES:. No scleral icterus. Extraocular muscles intact.  HEENT: Head atraumatic, normocephalic. Oropharynx and nasopharynx clear.  NECK:  Supple, no jugular venous distention. No thyroid enlargement, no tenderness.  LUNGS: Normal breath sounds bilaterally, no wheezing, rales, rhonchi. No use of accessory muscles of respiration. Perm Cath +chest wall CARDIOVASCULAR: S1, S2 normal. No murmurs, rubs, or gallops.  ABDOMEN: Soft, nontender, nondistended. Bowel sounds present. No organomegaly or mass. PEG placed no signs of infection  EXTREMITIES: 2+ LEE NEUROLOGIC: does not follow commands well  PSYCHIATRIC:  Awake with dementia  LABORATORY PANEL:  CBC Recent Labs  Lab 03/08/18 0255  WBC 7.6  HGB 8.1*  HCT 27.5*  PLT 158    Chemistries  Recent Labs  Lab 03/07/18 0425  03/11/18 0646  NA 140   < > 138  K 3.6   < > 3.8  CL 107   < > 98  CO2 27   < > 31  GLUCOSE 106*   < > 197*  BUN 58*   < > 53*  CREATININE 2.11*   < > 1.85*  CALCIUM 8.1*   < > 8.7*  AST 248*  --   --   ALT 298*  --   --   ALKPHOS 144*  --   --   BILITOT 0.8  --   --    < > = values in this interval not displayed.    Cardiac Enzymes Recent Labs  Lab 03/08/18 2144  TROPONINI 1.07*   RADIOLOGY:  No results found. ASSESSMENT AND PLAN:  [name] is an24 y.o.femalewith multiple comorbidities including diabetes type 2, hypertension, hyperlipidemia, coronary disease status post CABG, history of stroke, and CKD who was admitted on1/27/2020. [facility] [place] [facility] and achalasiaand 50 pound weight loss in 12/2017.Status post treatment for ESBL Klebsiella aspiration pneumonia, enterococcus UTI. Admitted for dialysis.  1. Acute kidney injury on chronic kidney disease stage III. Uremia, and multiple other comorbidities likely causing altered mental status/acute metabolic encephalopathy - complicated by CHF, poor nutrition, cardio renal syndrome. - Nephrologyfollowing; Dialysis as per nephrology Son wants to have patient to eventually transition to outpatient dialysis.  2.Acute on chronic systolic EF 49% and diastolic congestive heart failure with moderate MR and bilateral pleural effusion with and Anasarca and hypoalbuminemia Improving with dialysis Continue low-dose metoprolol   3.Dysphagia secondary to achalasia.  Continue PEG feedings as tolerated  4.Type II diabetes -sliding scale insulin  5.History of left temporal and frontal punctate CVA -on Plavix and Lipitor  6.Status post non-STEMI -- continue Plavix and statin  7. Lymph node within umbilical hernia, son declined core  biopsyaccording to [name] clinicalnotesGeneral surgery has evaluated [facility], no further recommendations at this time.-- Son has declined further workup according to [facility] cone notes  8. Deconditioning, frailty  Son decline Palliative care services  9.  Transient Afib int e setting of volume overlaod -now in sinus rhythm. Seen by cardiology Darlene. Fletcher [name]. Cardiology has signed off. -They do not recommend any anticoagulation no further cardiac workup  CODE  STATUS: full  DVT Prophylaxis: family has declined to use heparin  TOTAL TIME TAKING CARE OF THIS PATIENT: 30 minutes.   D/w son at bedside and [name] and CSW.   >50% time spent on counselling and coordination of care  Note: This dictation was prepared with Dragon dictation along with smaller phrase technology. Any transcriptional errors that result from this process are unintentional.  [name] M.D on 03/11/2018 at 10:23 AM  Between 7am to 6pm - Pager - [phone]  After 6pm go to [email or web address] - password [email or web address] [facility]  Office  [phone]  CC: Primary care physician; [name], MDPatient ID: [name], female   DOB: [birth year], 85 y.o.   MRN: [id number]

## 2018-03-11 NOTE — Progress Notes (Signed)
[place], [place] 03/11/18  Subjective:  Patient seen at bedside. Resting comfortably at the moment. Completed dialysis yesterday. Ultrafiltration achieved was 1.5 kg.   Objective:  Vital signs in last 24 hours:  Temp:  [97.5 F (36.4 C)-98 F (36.7 C)] 97.6 F (36.4 C) (02/09 1127) Pulse Rate:  [57-102] 57 (02/09 1127) Resp:  [18-32] 32 (02/09 1023) BP: (103-130)/(60-98) 129/61 (02/09 1127) SpO2:  [94 %-100 %] 96 % (02/09 1127) Weight:  [75.3 kg-75.6 kg] 75.3 kg (02/08 2324)  Weight change: -1.421 kg Filed Weights   03/10/18 0432 03/10/18 1915 03/10/18 2324  Weight: 77 kg 75.6 kg 75.3 kg    Intake/Output:    Intake/Output Summary (Last 24 hours) at 03/11/2018 1354 Last data filed at 03/10/2018 2324 Gross per 24 hour  Intake -  Output 1500 ml  Net -1500 ml     Physical Exam: General:  No acute distress  HEENT  moist oral mucous membranes  Neck  supple  Pulm/lungs  Scattered rhonchi, normal effort  CVS/Heart  no rub, irregular  Abdomen:   Soft, nontender, nondistended  Extremities:  2+ pitting edema   Neurologic:  Awake, oriented to self only  Skin:  No acute rashes  Access:  Right IJ permcath        Basic Metabolic Panel:  Recent Labs  Lab 03/05/18 0445 03/06/18 1308 03/07/18 0425 03/08/18 0255 03/08/18 1454 03/09/18 0953 03/10/18 2000 03/11/18 0646  NA 140  --  140 138  --  139  --  138  K 4.3  --  3.6 3.8  --  3.8  --  3.8  CL 103  --  107 101  --  100  --  98  CO2 29  --  27 27  --  29  --  31  GLUCOSE 214*  --  106* 258*  --  189*  --  197*  BUN 66*  --  58* 68*  --  52*  --  53*  CREATININE 2.38*  --  2.11* 2.77*  --  2.16*  --  1.85*  CALCIUM 9.1  --  8.1* 8.7*  --  8.9  --  8.7*  PHOS  --  3.0  --   --  3.2  --  1.7*  --      CBC: Recent Labs  Lab 03/05/18 0445 03/06/18 1625 03/07/18 0425 03/08/18 0255  WBC 9.6 8.0 8.6 7.6  HGB 7.7* 7.8* 7.9* 8.1*  HCT 25.9* 26.6* 26.5* 27.5*  MCV 99.6 101.5*  100.8* 101.9*  PLT 222 177 164 158      Lab Results  Component Value Date   HEPBSAG Negative 02/26/2018   HEPBSAB Non Reactive 02/26/2018   HEPBIGM Negative 02/26/2018      Microbiology:  Recent Results (from the past 240 hour(s))  CULTURE, BLOOD (ROUTINE X 2) w Reflex to ID Panel     Status: None   Collection Time: 03/06/18  9:01 PM  Result Value Ref Range Status   Specimen Description BLOOD LEFT ANTECUBITAL  Final   Special Requests   Final    BOTTLES DRAWN AEROBIC ONLY Blood Culture adequate volume   Culture   Final    NO GROWTH 5 DAYS Performed at [facility], [address] [place], [place] [address]    Report Status 03/11/2018 FINAL  Final  CULTURE, BLOOD (ROUTINE X 2) w Reflex to ID Panel     Status: None   Collection Time: 03/06/18  9:37 PM  Result Value Ref Range Status   Specimen Description BLOOD LEFT ANTECUBITAL  Final   Special Requests   Final    BOTTLES DRAWN AEROBIC AND ANAEROBIC Blood Culture adequate volume   Culture   Final    NO GROWTH 5 DAYS Performed at [facility], [address] [place], [place] [address]    Report Status 03/11/2018 FINAL  Final    Coagulation Studies: No results for input(s): LABPROT, INR in the last 72 hours.  Urinalysis: No results for input(s): COLORURINE, LABSPEC, PHURINE, GLUCOSEU, HGBUR, BILIRUBINUR, KETONESUR, PROTEINUR, UROBILINOGEN, NITRITE, LEUKOCYTESUR in the last 72 hours.  Invalid input(s): APPERANCEUR    Imaging: No results found.   Medications:   . albumin human 25 g (03/10/18 1946)  . feeding supplement (OSMOLITE 1.5 CAL) 1,000 mL (03/10/18 1726)   . atorvastatin  40 mg Per Tube q1800  . B-complex with vitamin C  1 tablet Per Tube Daily  . chlorhexidine  15 mL Mouth Rinse BID  . clopidogrel  75 mg Per Tube QHS  . collagenase   Topical Daily  . famotidine  10 mg Per Tube Daily  . feeding supplement (PRO-STAT SUGAR FREE 64)  30 mL Per Tube BID  . free water  20 mL Per Tube  Q4H  . insulin aspart  0-9 Units Subcutaneous Q4H  . mouth rinse  15 mL Mouth Rinse q12n4p  . metoprolol tartrate  12.5 mg Per Tube BID  . multivitamin  15 mL Per Tube QHS  . nutrition supplement (JUVEN)  1 packet Per Tube BID BM  . pantoprazole (PROTONIX) IV  40 mg Intravenous Daily  . polyethylene glycol  17 g Oral Daily   acetaminophen (TYLENOL) oral liquid 160 mg/5 mL **OR** acetaminophen **OR** acetaminophen, albumin human, albuterol, ipratropium, lip balm, Melatonin, nitroGLYCERIN, ondansetron, promethazine  Assessment/ Plan:  85 y.o. African-American female with multiple comorbidities including diabetes type 2, hypertension, hyperlipidemia, coronary disease status post CABG, history of stroke, CKD, was admitted on 02/26/2018. Originally admitted to [facility] with Achalasia and 50 pound weight loss, dysarthria  In NOV 2019  1.  Acute kidney injury on chronic kidney disease stage III.  Baseline creatinine probably 1.1 from Nov 2019 2.  Malnutrition causing weight loss, now has G-tube 3.  Generalized edema and pulmonary edema 4.  Chronic systolic congestive heart failure EF 30%.  CAD status post CABG 5.  Stroke with left-sided weakness 6.  Uremia, likely causing altered mental status 7.  Enlarging lymph node in umblical hernia  8.  Anemia of CKD. 9.  Hypotension.    Plan:  Patient completed dialysis yesterday.  We will plan for dialysis again tomorrow.  Consider daily dialysis next week with ultrafiltration target of 1.5 kg with each dialysis treatment.  Overall remains quite debilitated.  She will need to be able to sit up for approximately 5 hours prior to being accepted at an outpatient dialysis unit.  Further plan as patient progresses.    LOS: 13 Kenslei Hearty 2/9/20201:54 PM  [place], [place]  Note: This note was prepared with Dragon dictation. Any transcription errors are unintentional

## 2018-03-11 NOTE — Progress Notes (Signed)
Dr. Benjie Karvonen was notified of the pt. elevated troponin. No new orders at this time. In a nurse perspective, the patient looks miserable,uncomfoprtable and she goes in respiratory distress with a little bit of exertion, turning, or any movement. She can not tolerate prolonged exertion. She needs some kind of comfort medicine. Whatever that maybe.

## 2018-03-12 LAB — GLUCOSE, CAPILLARY
GLUCOSE-CAPILLARY: 135 mg/dL — AB (ref 70–99)
GLUCOSE-CAPILLARY: 139 mg/dL — AB (ref 70–99)
GLUCOSE-CAPILLARY: 125 mg/dL — AB (ref 70–99)
Glucose-Capillary: 136 mg/dL — ABNORMAL HIGH (ref 70–99)
Glucose-Capillary: 178 mg/dL — ABNORMAL HIGH (ref 70–99)
Glucose-Capillary: 63 mg/dL — ABNORMAL LOW (ref 70–99)
Glucose-Capillary: 75 mg/dL (ref 70–99)
Glucose-Capillary: 76 mg/dL (ref 70–99)
Glucose-Capillary: 79 mg/dL (ref 70–99)
Glucose-Capillary: 80 mg/dL (ref 70–99)

## 2018-03-12 LAB — BASIC METABOLIC PANEL
Anion gap: 7 (ref 5–15)
BUN: 76 mg/dL — ABNORMAL HIGH (ref 8–23)
CO2: 32 mmol/L (ref 22–32)
Calcium: 8.8 mg/dL — ABNORMAL LOW (ref 8.9–10.3)
Chloride: 98 mmol/L (ref 98–111)
Creatinine, Ser: 2.23 mg/dL — ABNORMAL HIGH (ref 0.44–1.00)
GFR calc Af Amer: 22 mL/min — ABNORMAL LOW (ref >60)
GFR, EST NON AFRICAN AMERICAN: 19 mL/min — AB (ref >60)
GLUCOSE: 87 mg/dL (ref 70–99)
Potassium: 3.7 mmol/L (ref 3.5–5.1)
Sodium: 137 mmol/L (ref 135–145)

## 2018-03-12 MED ORDER — EPOETIN ALFA 10000 UNIT/ML IJ SOLN
10000.0000 [IU] | INTRAMUSCULAR | Status: DC
  Administered 2018-03-16 – 2018-03-30 (×7): 10000 [IU] via INTRAVENOUS

## 2018-03-12 MED ORDER — CLOTRIMAZOLE 2 % VA CREA
1.0000 | TOPICAL_CREAM | Freq: Every day | VAGINAL | Status: DC
  Administered 2018-03-12 – 2018-03-13 (×2): 1 via VAGINAL
  Filled 2018-03-12 (×2): qty 21

## 2018-03-12 MED ORDER — OSMOLITE 1.5 CAL PO LIQD
1000.0000 mL | ORAL | Status: DC
  Administered 2018-03-12: 1000 mL

## 2018-03-12 MED ORDER — DEXTROSE 50 % IV SOLN
INTRAVENOUS | Status: AC
  Administered 2018-03-12: 50 mL
  Filled 2018-03-12: qty 50

## 2018-03-12 NOTE — Progress Notes (Signed)
[facility]  ROUNDING NOTE   Subjective:   Seen and examined on hemodialysis.     HEMODIALYSIS FLOWSHEET:  Blood Flow Rate (mL/min): 300 mL/min Arterial Pressure (mmHg): -150 mmHg Venous Pressure (mmHg): 130 mmHg Transmembrane Pressure (mmHg): 50 mmHg Ultrafiltration Rate (mL/min): 300 mL/min Dialysate Flow Rate (mL/min): 600 ml/min Conductivity: Machine : 13.5 Conductivity: Machine : 13.5 Dialysis Fluid Bolus: Normal Saline Bolus Amount (mL): 250 mL Dialysate Change: (Na 140)    Objective:  Vital signs in last 24 hours:  Temp:  [97.3 F (36.3 C)-97.6 F (36.4 C)] 97.5 F (36.4 C) (02/10 1226) Pulse Rate:  [72-92] 75 (02/10 1219) Resp:  [12-27] 19 (02/10 1219) BP: (47-120)/(30-83) 101/54 (02/10 1219) SpO2:  [94 %-100 %] 100 % (02/10 1219) Weight:  [76.3 kg-77.3 kg] 76.3 kg (02/10 1200)  Weight change:  Filed Weights   03/10/18 2324 03/12/18 0843 03/12/18 1200  Weight: 75.3 kg 77.3 kg 76.3 kg    Intake/Output: I/O last 3 completed shifts: In: -  Out: 1500 [Other:1500]   Intake/Output this shift:  Total I/O In: -  Out: 1001 [Other:1001]  Physical Exam: General: NAD,   Head: Normocephalic, atraumatic. Moist oral mucosal membranes  Eyes: Anicteric, PERRL  Neck: Supple, trachea midline  Lungs:  Clear to auscultation  Heart: Regular rate and rhythm  Abdomen:  Soft, nontender,   Extremities:  noperipheral edema.  Neurologic: Alert to self  Skin: No lesions  Access: RIJ permcath    Basic Metabolic Panel: Recent Labs  Lab 03/06/18 1308  03/07/18 0425 03/08/18 0255 03/08/18 1454 03/09/18 0953 03/10/18 2000 03/11/18 0646 03/12/18 0440  NA  --   --  140 138  --  139  --  138 137  K  --   --  3.6 3.8  --  3.8  --  3.8 3.7  CL  --   --  107 101  --  100  --  98 98  CO2  --   --  27 27  --  29  --  31 32  GLUCOSE  --   --  106* 258*  --  189*  --  197* 87  BUN  --   --  58* 68*  --  52*  --  53* 76*  CREATININE  --   --  2.11* 2.77*  --   2.16*  --  1.85* 2.23*  CALCIUM  --    < > 8.1* 8.7*  --  8.9  --  8.7* 8.8*  PHOS 3.0  --   --   --  3.2  --  1.7*  --   --    < > = values in this interval not displayed.    Liver Function Tests: Recent Labs  Lab 03/07/18 0425  AST 248*  ALT 298*  ALKPHOS 144*  BILITOT 0.8  PROT 5.9*  ALBUMIN 1.8*   Recent Labs  Lab 03/06/18 1308  LIPASE 27  AMYLASE 56   No results for input(s): AMMONIA in the last 168 hours.  CBC: Recent Labs  Lab 03/06/18 1625 03/07/18 0425 03/08/18 0255  WBC 8.0 8.6 7.6  HGB 7.8* 7.9* 8.1*  HCT 26.6* 26.5* 27.5*  MCV 101.5* 100.8* 101.9*  PLT 177 164 158    Cardiac Enzymes: Recent Labs  Lab 03/08/18 1454 03/08/18 2144 03/11/18 1030 03/11/18 1629 03/11/18 2118  TROPONINI 1.00* 1.07* 1.00* 0.90* 0.91*    BNP: Invalid input(s): POCBNP  CBG: Recent Labs  Lab 03/12/18 0604 03/12/18  8563 03/12/18 0809 03/12/18 1207 03/12/18 1244  GLUCAP 80 76 79 59* 135*    Microbiology: Results for orders placed or performed during the hospital encounter of 02/26/18  MRSA PCR Screening     Status: Abnormal   Collection Time: 02/26/18  5:39 PM  Result Value Ref Range Status   MRSA by PCR POSITIVE (A) NEGATIVE Final    Comment:        The GeneXpert MRSA Assay (FDA approved for NASAL specimens only), is one component of a comprehensive MRSA colonization surveillance program. It is not intended to diagnose MRSA infection nor to guide or monitor treatment for MRSA infections. CRITICAL RESULT CALLED TO, READ BACK BY AND VERIFIED WITH:  CALLED TO DANA DUGGINS RN @1920  Performed at [facility], [address] [place], [place] [address]   CULTURE, BLOOD (ROUTINE X 2) w Reflex to ID Panel     Status: None   Collection Time: 03/06/18  9:01 PM  Result Value Ref Range Status   Specimen Description BLOOD LEFT ANTECUBITAL  Final   Special Requests   Final    BOTTLES DRAWN AEROBIC ONLY Blood Culture adequate volume   Culture    Final    NO GROWTH 5 DAYS Performed at [facility], [address] [place], [place] [address]    Report Status 03/11/2018 FINAL  Final  CULTURE, BLOOD (ROUTINE X 2) w Reflex to ID Panel     Status: None   Collection Time: 03/06/18  9:37 PM  Result Value Ref Range Status   Specimen Description BLOOD LEFT ANTECUBITAL  Final   Special Requests   Final    BOTTLES DRAWN AEROBIC AND ANAEROBIC Blood Culture adequate volume   Culture   Final    NO GROWTH 5 DAYS Performed at [facility], [address] [place],  [address]    Report Status 03/11/2018 FINAL  Final    Coagulation Studies: No results for input(s): LABPROT, INR in the last 72 hours.  Urinalysis: No results for input(s): COLORURINE, LABSPEC, PHURINE, GLUCOSEU, HGBUR, BILIRUBINUR, KETONESUR, PROTEINUR, UROBILINOGEN, NITRITE, LEUKOCYTESUR in the last 72 hours.  Invalid input(s): APPERANCEUR    Imaging: No results found.   Medications:   . albumin human 25 g (03/10/18 1946)  . feeding supplement (OSMOLITE 1.5 CAL)     . atorvastatin  40 mg Per Tube q1800  . B-complex with vitamin C  1 tablet Per Tube Daily  . chlorhexidine  15 mL Mouth Rinse BID  . clopidogrel  75 mg Per Tube QHS  . clotrimazole  1 Applicatorful Vaginal QHS  . collagenase   Topical Daily  . famotidine  10 mg Per Tube Daily  . feeding supplement (PRO-STAT SUGAR FREE 64)  30 mL Per Tube BID  . free water  20 mL Per Tube Q4H  . insulin aspart  0-9 Units Subcutaneous Q4H  . mouth rinse  15 mL Mouth Rinse q12n4p  . metoprolol tartrate  12.5 mg Per Tube BID  . multivitamin  15 mL Per Tube QHS  . nutrition supplement (JUVEN)  1 packet Per Tube BID BM  . pantoprazole (PROTONIX) IV  40 mg Intravenous Daily  . polyethylene glycol  17 g Oral Daily   acetaminophen (TYLENOL) oral liquid 160 mg/5 mL **OR** acetaminophen **OR** acetaminophen, albumin human, albuterol, ipratropium, lip balm, Melatonin, nitroGLYCERIN, ondansetron,  promethazine  Assessment/ Plan:  [name] is a 85 y.o. black female with diabetes mellitus type II, hypertension, hyperlipidemia, coronary disease  status post CABG, history of stroke, CKD, was admitted on1/27/2020. [facility] [place] [facility] 50 pound weight loss, dysarthria since November 2019.   1. Acute kidney injury on chronic kidney disease stage III. Baseline creatinine probably 1.1 from Nov 2019 Requiring hemodialysis. Concern for uremic encephalopathy.  Seen and examined on hemodialysis treatment. Tolerating treatment well.   2. Hypotension: blood pressure acceptable during hemodialysis treatment  3. Anemia of chronic kidney disease: hemoglobin 8.1 on 2/6. No new CBC - EPO with HD treatment.   4. Malnutrition with achalasia: G tube placed. Tolerating tube feeds.  Albumin 1.8     LOS: 14 [name] 2/10/20202:12 PM

## 2018-03-12 NOTE — Progress Notes (Signed)
Post HD Tx    03/12/18 1200  Hand-Off documentation  Report given to (Full Name) Concepcion Living, RN   Report received from (Full Name) Beatris Ship, RN   Vital Signs  Temp 97.6 F (36.4 C)  Temp Source Oral  Pulse Rate 78  Pulse Rate Source Monitor  Resp (!) 24  BP 101/60  BP Location Left Arm  BP Method Automatic  Patient Position (if appropriate) Lying  Oxygen Therapy  SpO2 100 %  O2 Device Nasal Cannula  O2 Flow Rate (L/min) 2 L/min  Pulse Oximetry Type Continuous  Pain Assessment  Pain Scale 0-10  Pain Score 0  Dialysis Weight  Weight 76.3 kg  Type of Weight Post-Dialysis     03/12/18 1200  Hand-Off documentation  Report given to (Full Name) Concepcion Living, RN   Report received from (Full Name) Beatris Ship, RN   Vital Signs  Temp 97.6 F (36.4 C)  Temp Source Oral  Pulse Rate 78  Pulse Rate Source Monitor  Resp (!) 24  BP 101/60  BP Location Left Arm  BP Method Automatic  Patient Position (if appropriate) Lying  Oxygen Therapy  SpO2 100 %  O2 Device Nasal Cannula  O2 Flow Rate (L/min) 2 L/min  Pulse Oximetry Type Continuous  Pain Assessment  Pain Scale 0-10  Pain Score 0  Dialysis Weight  Weight 76.3 kg  Type of Weight Post-Dialysis

## 2018-03-12 NOTE — Progress Notes (Signed)
Notified pt's son that pt was to go to dialysis in aprox 15-20 min. Son states that "there must have been an inadvertent nursing error and she must have gotten more than just one unit of insulin at midnight. She can't go to dialysis until Dr Benjie Karvonen as done something about her sugar.". Son educated that her blood sugars are within normal limits at 75, 80, and 76 from 0400-0730.   Educated that this RN has spoken to dialysis RN and that they will leave tube feed running during procedure due to her low sugar. Son again states that he does not want her to go to dialysis until the sugar is addressed. Again stated that he was concerned nursing staff gave too much insulin since "Ive been a doctor for 30 years and i've seen it before".   Educated son that if he wants to refuse dialysis until later he is able to do so, but RN does not know how long it will take MD to get back to RN. Educated that per [facility] protocol pt is within normal range.   Son stated to RN that "I'm not refusing dialysis. She need to go. You misunderstand me". Educated by this RN that if he chooses to wait until the MD speak with the RN that HD will have to be held and the patient can go later in the day. Son stated that he dose want her to go to HD and that "You need to check her sugar again". NT checked sugar again at son's request and the CBG was 79. Son stated that the pt could now go to dialysis.   RN paged MD via epic chat. Awaiting response.

## 2018-03-12 NOTE — Progress Notes (Signed)
Pre HD Tx   03/12/18 0843  Vital Signs  Temp 97.6 F (36.4 C)  Temp Source Oral  Pulse Rate 82  Pulse Rate Source Monitor  Resp 16  BP (!) 47/30 (B/P location changed, inaccyrate )  BP Location Left Leg  BP Method Automatic  Patient Position (if appropriate) Lying  Oxygen Therapy  SpO2 100 %  O2 Device Nasal Cannula  O2 Flow Rate (L/min) 2 L/min  Pulse Oximetry Type Continuous  Pain Assessment  Pain Scale 0-10  Pain Score 0  Dialysis Weight  Weight 77.3 kg  Type of Weight Pre-Dialysis  Time-Out for Hemodialysis  What Procedure? HD  Pt Identifiers(min of two) First/Last Name;MRN/Account#  Correct Site? Yes  Correct Side? Yes  Correct Procedure? Yes  Consents Verified? Yes  Rad Studies Available? N/A  Safety Precautions Reviewed? Yes  Engineer, civil (consulting) Number 5  Station Number 3  UF/Alarm Test Passed  Conductivity: Meter 14  Conductivity: Machine  14  pH 7.4  Reverse Osmosis Main  Normal Saline Lot Number [id number]  Dialyzer Lot Number [id number]  Disposable Set Lot Number [id number]  Machine Temperature 98.6 F (37 C)  Musician and Audible Yes  Blood Lines Intact and Secured Yes  Pre Treatment Patient Checks  Vascular access used during treatment Catheter  Hepatitis B Surface Antigen Results Negative  Date Hepatitis B Surface Antigen Drawn 02/26/18  Hepatitis B Surface Antibody  (<10)  Date Hepatitis B Surface Antibody Drawn 02/26/18  Hemodialysis Consent Verified Yes  Hemodialysis Standing Orders Initiated Yes  ECG (Telemetry) Monitor On Yes  Prime Ordered Normal Saline  Length of  DialysisTreatment -hour(s) 3 Hour(s)  Dialysis Treatment Comments Na 140  Dialyzer Elisio 17H NR  Dialysate 3K, 2.5 Ca  Dialysis Anticoagulant None  Dialysate Flow Ordered 600  Blood Flow Rate Ordered 300 mL/min  Ultrafiltration Goal 1 Liters  Pre Treatment Labs Phosphorus  Dialysis Blood Pressure Support Ordered Normal Saline  Education / Care Plan  Dialysis  Education Provided No (Comment)  Documented Education in Care Plan  (Pt is disoriented )  Hemodialysis Catheter Right Subclavian Double-lumen;Permanent  Placement Date: 03/05/18   Orientation: Right  Access Location: Subclavian  Hemodialysis Catheter Type: Double-lumen;Permanent  Site Condition No complications  Blue Lumen Status Flushed  Red Lumen Status Flushed  Purple Lumen Status N/A  Dressing Type Occlusive;Biopatch  Dressing Status Clean;Dry;Intact  Interventions Dressing changed  Dressing Change Due 03/18/18

## 2018-03-12 NOTE — Progress Notes (Signed)
[facility] [place] NAME: [name]    MR#:  [id number]  DATE OF BIRTH:  02-Oct-1927  SUBJECTIVE:  D/w son about blood sugars and dietary RECS increasing tube feeds  Patient had chest pain yesterday after there bath  REVIEW OF SYSTEMS:   unable to obtain from patient   DRUG ALLERGIES:   Allergies  Allergen Reactions  . Reglan [Metoclopramide]     Pt's son reports this causes patient tremors and neurologic issues   . Contrast Media [Iodinated Diagnostic Agents]     Sensitivity, kidney issues    VITALS:  Blood pressure 99/60, pulse 76, temperature 97.6 F (36.4 C), temperature source Oral, resp. rate 20, height 5\' 3"  (1.6 m), weight 77.3 kg, SpO2 100 %.  PHYSICAL EXAMINATION:   Physical Exam   GENERAL:  85 y.o.-year-old patient lying in the bed with no acute distress. chronically ill. Anasarca EYES:. No scleral icterus. Extraocular muscles intact.  HEENT: Head atraumatic, normocephalic. Oropharynx and nasopharynx clear.  NECK:  Supple, no jugular venous distention. No thyroid enlargement, no tenderness.  LUNGS: Normal breath sounds bilaterally, no wheezing, rales, rhonchi. No use of accessory muscles of respiration. Perm Cath +chest wall CARDIOVASCULAR: S1, S2 normal. No murmurs, rubs, or gallops.  ABDOMEN: Soft, nontender, nondistended. Bowel sounds present. No organomegaly or mass. PEG placed no signs of infection  EXTREMITIES: 2+ LEE NEUROLOGIC: does not follow commands well  PSYCHIATRIC:  Awake with dementia  LABORATORY PANEL:  CBC Recent Labs  Lab 03/08/18 0255  WBC 7.6  HGB 8.1*  HCT 27.5*  PLT 158    Chemistries  Recent Labs  Lab 03/07/18 0425  03/12/18 0440  NA 140   < > 137  K 3.6   < > 3.7  CL 107   < > 98  CO2 27   < > 32  GLUCOSE 106*   < > 87  BUN 58*   < > 76*  CREATININE 2.11*   < > 2.23*  CALCIUM 8.1*   < > 8.8*  AST 248*  --   --   ALT 298*  --   --   ALKPHOS 144*  --   --    BILITOT 0.8  --   --    < > = values in this interval not displayed.   Cardiac Enzymes Recent Labs  Lab 03/11/18 2118  TROPONINI 0.91*   RADIOLOGY:  No results found. ASSESSMENT AND PLAN:  [name] is an26 y.o.femalewith multiple comorbidities including diabetes type 2, hypertension, hyperlipidemia, coronary disease status post CABG, history of stroke, and CKD who was admitted on1/27/2020. [facility] [place] [facility]ys[facility] and achalasiaand 50 pound weight loss in 12/2017.Status post treatment for ESBL Klebsiella aspiration pneumonia, enterococcus UTI. Admitted for dialysis.  1. Acute kidney injury on chronic kidney disease stage III. Uremia, and multiple other comorbidities likely causing altered mental status/acute metabolic encephalopathy - complicated by CHF, poor nutrition, cardio renal syndrome. - Nephrologyfollowing; Dialysis as per nephrology Son wants to have patient to eventually transition to outpatient dialysis.  2.Acute on chronic systolic EF 96% and diastolic congestive heart failure with moderate MR and bilateral pleural effusion with and Anasarca and hypoalbuminemia Improving with dialysis Continue low-dose metoprolol   3.Dysphagia secondary to achalasia.  Continue PEG feedings as tolerated  Increase Tube feeds today   4.Type II diabetes: lowish BS this am but better now Continue to monitor blood sugars Continue sliding scale insulin  5.History of  left temporal and frontal punctate CVA -on Plavix and Lipitor  6.Status post non-STEMI -- continue Plavix and statin -had episode of chest pain but troponins actually trending down likely chest pain was result of being moved and bathed 7. Lymph node within umbilical hernia, son declined core biopsyaccording to [place] clinicalnotesGeneral surgery has evaluated at [facility], no further recommendations at this time.-- Son has declined further workup according to [facility]  cone notes  8. Deconditioning, frailty  Son decline Palliative care services  9.  Transient Afib in the setting of volume overlaod -now in sinus rhythm. Seen by cardiology Dr. Fletcher [name]. Cardiology has signed off. -They do not recommend any anticoagulation no further cardiac workup   10. Vaginal discharge: None reported this am Will d/w pharmacy to see what we have on formulary if this reoccurs.   CODE STATUS: full  DVT Prophylaxis: family has declined to use heparin  TOTAL TIME TAKING CARE OF THIS PATIENT: 25 minutes.   D/w son   >50% time spent on counselling and coordination of care  Note: This dictation was prepared with Dragon dictation along with smaller phrase technology. Any transcriptional errors that result from this process are unintentional.  [name] M.D on 03/12/2018 at 11:23 AM  Between 7am to 6pm - Pager - [phone]  After 6pm go to [email or web address] - password [email or web address] [facility]  Office  [phone]  CC: Primary care physician; [name], MDPatient ID: [name], female   DOB: 07-31-1927, 85 y.o.   MRN: [id number]

## 2018-03-12 NOTE — Progress Notes (Signed)
Post HD Assessment    03/12/18 1200  Neurological  Level of Consciousness Responds to Voice  Orientation Level Disoriented to time;Disoriented to situation;Disoriented to place  Respiratory  Respiratory Pattern Regular;Accessory muscle use  Chest Assessment Chest expansion symmetrical  Bilateral Breath Sounds Clear;Diminished  Cough None  Cardiac  Pulse Regular  Heart Sounds S1, S2  ECG Monitor Yes  Cardiac Rhythm NSR;BBB  Vascular  R Radial Pulse +1  L Radial Pulse +1  Edema Generalized  Generalized Edema +2  Psychosocial  Psychosocial (WDL) WDL  Patient Behaviors Calm

## 2018-03-12 NOTE — Progress Notes (Signed)
Pre HD Assessment    03/12/18 0830  Neurological  Level of Consciousness Alert  Orientation Level Disoriented to time;Disoriented to situation;Disoriented to place  Respiratory  Respiratory Pattern Regular  Chest Assessment Chest expansion symmetrical  Bilateral Breath Sounds Clear;Diminished  Cough None  Cardiac  Pulse Regular  ECG Monitor Yes  Cardiac Rhythm NSR;BBB  Vascular  R Radial Pulse +1  L Radial Pulse +1  Edema Generalized  Generalized Edema +2  Psychosocial  Psychosocial (WDL) WDL  Patient Behaviors Calm

## 2018-03-12 NOTE — Progress Notes (Signed)
HD Tx started w/o issue    03/12/18 0845  Vital Signs  Pulse Rate 79  Pulse Rate Source Monitor  Resp (!) 23  BP 103/68  BP Location Left Arm  BP Method Automatic  Patient Position (if appropriate) Lying  Oxygen Therapy  SpO2 97 %  During Hemodialysis Assessment  Blood Flow Rate (mL/min) 300 mL/min  Arterial Pressure (mmHg) -170 mmHg  Venous Pressure (mmHg) 120 mmHg  Transmembrane Pressure (mmHg) 50 mmHg  Ultrafiltration Rate (mL/min) 300 mL/min  Dialysate Flow Rate (mL/min) 600 ml/min  Conductivity: Machine  14  HD Safety Checks Performed Yes  Dialysis Fluid Bolus Normal Saline  Bolus Amount (mL) 250 mL  Intra-Hemodialysis Comments Tx initiated

## 2018-03-12 NOTE — Progress Notes (Addendum)
HD Tx completed, tolerated well, UF goal met.   03/12/18 1145  Vital Signs  Pulse Rate 78  Resp (!) 24  BP 101/60  Oxygen Therapy  SpO2 100 %  O2 Device Nasal Cannula  O2 Flow Rate (L/min) 2 L/min  Pulse Oximetry Type Continuous  During Hemodialysis Assessment  HD Safety Checks Performed Yes  KECN 65.6 KECN  Dialysis Fluid Bolus Normal Saline  Intra-Hemodialysis Comments Tx completed  Post-Hemodialysis Assessment  Rinseback Volume (mL) 250 mL  KECN 65.6 V  Dialyzer Clearance Lightly streaked  Duration of HD Treatment -hour(s) 3 hour(s)  Hemodialysis Intake (mL) 500 mL  UF Total -Machine (mL) 1501 mL  Net UF (mL) 1001 mL  Tolerated HD Treatment Yes  Hemodialysis Catheter Right Subclavian Double-lumen;Permanent  Placement Date: 03/05/18   Orientation: Right  Access Location: Subclavian  Hemodialysis Catheter Type: Double-lumen;Permanent  Site Condition No complications  Blue Lumen Status Heparin locked  Red Lumen Status Heparin locked  Purple Lumen Status N/A  Dressing Status Clean;Dry;Intact  Post treatment catheter status Capped and Clamped

## 2018-03-13 LAB — GLUCOSE, CAPILLARY
Glucose-Capillary: 189 mg/dL — ABNORMAL HIGH (ref 70–99)
Glucose-Capillary: 184 mg/dL — ABNORMAL HIGH (ref 70–99)
Glucose-Capillary: 160 mg/dL — ABNORMAL HIGH (ref 70–99)
Glucose-Capillary: 159 mg/dL — ABNORMAL HIGH (ref 70–99)
Glucose-Capillary: 213 mg/dL — ABNORMAL HIGH (ref 70–99)
Glucose-Capillary: 219 mg/dL — ABNORMAL HIGH (ref 70–99)

## 2018-03-13 LAB — BASIC METABOLIC PANEL
Anion gap: 4 — ABNORMAL LOW (ref 5–15)
BUN: 50 mg/dL — ABNORMAL HIGH (ref 8–23)
CO2: 34 mmol/L — AB (ref 22–32)
Calcium: 8.5 mg/dL — ABNORMAL LOW (ref 8.9–10.3)
Chloride: 100 mmol/L (ref 98–111)
Creatinine, Ser: 1.98 mg/dL — ABNORMAL HIGH (ref 0.44–1.00)
GFR calc Af Amer: 26 mL/min — ABNORMAL LOW (ref >60)
GFR calc non Af Amer: 22 mL/min — ABNORMAL LOW (ref >60)
Glucose, Bld: 201 mg/dL — ABNORMAL HIGH (ref 70–99)
Potassium: 3.7 mmol/L (ref 3.5–5.1)
Sodium: 138 mmol/L (ref 135–145)

## 2018-03-13 MED ORDER — OSMOLITE 1.5 CAL PO LIQD
1000.0000 mL | ORAL | Status: DC
  Administered 2018-03-13 – 2018-04-02 (×15): 1000 mL

## 2018-03-13 NOTE — Progress Notes (Signed)
[facility]  ROUNDING NOTE   Subjective:   Hemodialysis treatment yesterday. Tolerated treatment well. UF of 1 liter.   Patient is more responsive today. Confused. But able to obey simple commands.   Objective:  Vital signs in last 24 hours:  Temp:  [97.5 F (36.4 C)-97.9 F (36.6 C)] 97.9 F (36.6 C) (02/11 1149) Pulse Rate:  [77-84] 84 (02/11 1149) Resp:  [16-19] 16 (02/11 1149) BP: (87-108)/(53-75) 108/71 (02/11 1149) SpO2:  [100 %] 100 % (02/11 1149) FiO2 (%):  [2 %] 2 % (02/11 0629) Weight:  [72.5 kg] 72.5 kg (02/11 0500)  Weight change:  Filed Weights   03/12/18 0843 03/12/18 1200 03/13/18 0500  Weight: 77.3 kg 76.3 kg 72.5 kg    Intake/Output: I/O last 3 completed shifts: In: -  Out: 1001 [Other:1001]   Intake/Output this shift:  Total I/O In: 40 [Other:40] Out: -   Physical Exam: General: NAD,   Head: Normocephalic, atraumatic. Moist oral mucosal membranes  Eyes: Anicteric, PERRL  Neck: Supple, trachea midline  Lungs:  Clear to auscultation  Heart: Regular rate and rhythm  Abdomen:  Soft, nontender, +G-tube  Extremities:  noperipheral edema.  Neurologic: Alert to self  Skin: No lesions  Access: RIJ permcath    Basic Metabolic Panel: Recent Labs  Lab 03/06/18 1308  03/08/18 0255 03/08/18 1454 03/09/18 0953 03/10/18 2000 03/11/18 0646 03/12/18 0440 03/13/18 0504  NA  --    < > 138  --  139  --  138 137 138  K  --    < > 3.8  --  3.8  --  3.8 3.7 3.7  CL  --    < > 101  --  100  --  98 98 100  CO2  --    < > 27  --  29  --  31 32 34*  GLUCOSE  --    < > 258*  --  189*  --  197* 87 201*  BUN  --    < > 68*  --  52*  --  53* 76* 50*  CREATININE  --    < > 2.77*  --  2.16*  --  1.85* 2.23* 1.98*  CALCIUM  --    < > 8.7*  --  8.9  --  8.7* 8.8* 8.5*  PHOS 3.0  --   --  3.2  --  1.7*  --   --   --    < > = values in this interval not displayed.    Liver Function Tests: Recent Labs  Lab 03/07/18 0425  AST 248*  ALT 298*   ALKPHOS 144*  BILITOT 0.8  PROT 5.9*  ALBUMIN 1.8*   Recent Labs  Lab 03/06/18 1308  LIPASE 27  AMYLASE 56   No results for input(s): AMMONIA in the last 168 hours.  CBC: Recent Labs  Lab 03/06/18 1625 03/07/18 0425 03/08/18 0255  WBC 8.0 8.6 7.6  HGB 7.8* 7.9* 8.1*  HCT 26.6* 26.5* 27.5*  MCV 101.5* 100.8* 101.9*  PLT 177 164 158    Cardiac Enzymes: Recent Labs  Lab 03/08/18 1454 03/08/18 2144 03/11/18 1030 03/11/18 1629 03/11/18 2118  TROPONINI 1.00* 1.07* 1.00* 0.90* 0.91*    BNP: Invalid input(s): POCBNP  CBG: Recent Labs  Lab 03/12/18 2054 03/12/18 2351 03/13/18 0438 03/13/18 0731 03/13/18 1128  GLUCAP 125* 178* 189* 184* 160*    Microbiology: Results for orders placed or performed during the hospital encounter of  02/26/18  MRSA PCR Screening     Status: Abnormal   Collection Time: 02/26/18  5:39 PM  Result Value Ref Range Status   MRSA by PCR POSITIVE (A) NEGATIVE Final    Comment:        The GeneXpert MRSA Assay (FDA approved for NASAL specimens only), is one component of a comprehensive MRSA colonization surveillance program. It is not intended to diagnose MRSA infection nor to guide or monitor treatment for MRSA infections. CRITICAL RESULT CALLED TO, READ BACK BY AND VERIFIED WITH:  CALLED TO DANA DUGGINS RN @1920  Performed at [facility], [address] [place], [place] [address]   CULTURE, BLOOD (ROUTINE X 2) w Reflex to ID Panel     Status: None   Collection Time: 03/06/18  9:01 PM  Result Value Ref Range Status   Specimen Description BLOOD LEFT ANTECUBITAL  Final   Special Requests   Final    BOTTLES DRAWN AEROBIC ONLY Blood Culture adequate volume   Culture   Final    NO GROWTH 5 DAYS Performed at [facility], [address] [place], [place] [address]    Report Status 03/11/2018 FINAL  Final  CULTURE, BLOOD (ROUTINE X 2) w Reflex to ID Panel     Status: None   Collection Time: 03/06/18  9:37 PM   Result Value Ref Range Status   Specimen Description BLOOD LEFT ANTECUBITAL  Final   Special Requests   Final    BOTTLES DRAWN AEROBIC AND ANAEROBIC Blood Culture adequate volume   Culture   Final    NO GROWTH 5 DAYS Performed at [facility], [address] [place], [place] [address]    Report Status 03/11/2018 FINAL  Final    Coagulation Studies: No results for input(s): LABPROT, INR in the last 72 hours.  Urinalysis: No results for input(s): COLORURINE, LABSPEC, PHURINE, GLUCOSEU, HGBUR, BILIRUBINUR, KETONESUR, PROTEINUR, UROBILINOGEN, NITRITE, LEUKOCYTESUR in the last 72 hours.  Invalid input(s): APPERANCEUR    Imaging: No results found.   Medications:   . albumin human 25 g (03/10/18 1946)  . feeding supplement (OSMOLITE 1.5 CAL) 1,000 mL (03/13/18 1233)   . atorvastatin  40 mg Per Tube q1800  . B-complex with vitamin C  1 tablet Per Tube Daily  . chlorhexidine  15 mL Mouth Rinse BID  . clopidogrel  75 mg Per Tube QHS  . clotrimazole  1 Applicatorful Vaginal QHS  . collagenase   Topical Daily  . [START ON 03/14/2018] epoetin (EPOGEN/PROCRIT) injection  10,000 Units Intravenous Q M,W,F-HD  . famotidine  10 mg Per Tube Daily  . feeding supplement (PRO-STAT SUGAR FREE 64)  30 mL Per Tube BID  . free water  20 mL Per Tube Q4H  . insulin aspart  0-9 Units Subcutaneous Q4H  . mouth rinse  15 mL Mouth Rinse q12n4p  . multivitamin  15 mL Per Tube QHS  . nutrition supplement (JUVEN)  1 packet Per Tube BID BM  . pantoprazole (PROTONIX) IV  40 mg Intravenous Daily  . polyethylene glycol  17 g Oral Daily   acetaminophen (TYLENOL) oral liquid 160 mg/5 mL **OR** acetaminophen **OR** acetaminophen, albumin human, albuterol, ipratropium, lip balm, Melatonin, nitroGLYCERIN, ondansetron, promethazine  Assessment/ Plan:  [name] is a 85 y.o. black female with diabetes mellitus type II, hypertension, hyperlipidemia, coronary disease status post CABG, history of  stroke, CKD, was admitted on1/27/2020. Originally admitted to [place] hospitalwith Achalasiaand 50 pound weight loss, dysarthria since  November 2019.   1. Acute kidney injury on chronic kidney disease stage III. Baseline creatinine probably 1.1 from Nov 2019 Requiring hemodialysis. Concern for uremic encephalopathy.  No signs of renal recovery.  Will plan on next hemodialysis treatment for tomorrow.   2. Hypotension: blood pressure low. Consider midodrine.   3. Anemia of chronic kidney disease: hemoglobin 8.1 on 2/6. Macrocytic.  No new CBC - EPO with HD treatment. CBC tomorrow  4. Malnutrition with achalasia: G tube placed. Tolerating tube feeds.  Albumin 1.8     LOS: 15 Jachai Okazaki 2/11/202012:42 PM

## 2018-03-13 NOTE — Progress Notes (Signed)
Nutrition Follow Up Note   DOCUMENTATION CODES:   Not applicable  INTERVENTION:   Increase Osmolite 1.5 to goal rate of 26ml/hr   Prostat 50ml BID via tube   Free water flushes 41ml q 4hrs  Regimen provides 1640kcal/day, 90g/day protein, 823ml free water  Liquid MVI daily via tube  B-complex with C daily via tube  Juven Fruit Punch BID, each serving provides 95kcal and 2.5g of protein (amino acids glutamine and arginine)  NUTRITION DIAGNOSIS:   Inadequate oral intake related to dysphagia as evidenced by NPO status.  GOAL:   Patient will meet greater than or equal to 90% of their needs  -progressing with tube feeds  MONITOR:   Labs, Weight trends, TF tolerance, Skin, I & O's  ASSESSMENT:   85 y.o. female with a known history of acute metabolic encephalopathy, combined systolic diastolic congestive heart failure moderate MR, acute CVA, ESBL Klebsiella aspiration pneumonia and enterococcus UTI, acute non-STEMI was admitted at [facility] in early part of November with weight loss and dysphagia due to achalasia. Pt transferred to [facility] for HD initiation    Pt tolerating tube feeds at 41ml/hr; will plan to increase to goal rate today. Pt has continued to have intermittent vomiting throughout her entire stay in the health system; pt vomited 2/8 on 78ml/hr of tube feeds. Pt with hypoglycemia yesterday; this has resolved today with increase in tube feeds. Per chart, pt down ~33lbs since admit; pt continues to be ~27kg above her UBW. RD is able to feel some severe muscle wasting around pt's clavicle and temporal regions. Pt has continuously had her tube feeds stopped r/t HD, nausea/vomiting and per her son's request. Pt has not been able to meet her estimated needs continuously while in hospital. It is uncertain wether or not pt has had any true muscle or fat loss secondary to severe edema. Pt has continued to have HD daily. Recommend continue tube feeds at goal rate as long as patient is  tolerating.   Medications reviewed and include: B complex with C, plavix, epoetin, insulin, pepcid, MVI, protonix, miralax   Labs reviewed: BUN 50(H), creat 1.98(H) cbgs- 189, 184, 160 x 24 hrs  Diet Order:   Diet Order            Diet NPO time specified  Diet effective now             EDUCATION NEEDS:   Not appropriate for education at this time  Skin:  Skin Assessment: Reviewed RN Assessment(unstageable wound buttocks, MASD )  Last BM:  2/11- type 7  Height:   Ht Readings from Last 1 Encounters:  03/06/18 5\' 3"  (1.6 m)    Weight:   Wt Readings from Last 1 Encounters:  03/13/18 72.5 kg    Ideal Body Weight:  52.3 kg  BMI:  Body mass index is 28.31 kg/m.  Estimated Nutritional Needs:   Kcal:  1400-1600kcal/day   Protein:  78-89g/day   Fluid:  UOP +1L  Koleen Distance MS, RD, LDN Pager #- [phone] Office#- [phone] After Hours Pager: [phone]

## 2018-03-13 NOTE — Progress Notes (Signed)
[facility] at [place] NAME: Keiasia Christianson    MR#:  [id number]  DATE OF BIRTH:  Mar 31, 1927  SUBJECTIVE:  Patient sleeping soundly today  Blood pressure low this morning REVIEW OF SYSTEMS:   unable to obtain from patient   DRUG ALLERGIES:   Allergies  Allergen Reactions  . Reglan [Metoclopramide]     Pt's son reports this causes patient tremors and neurologic issues   . Contrast Media [Iodinated Diagnostic Agents]     Sensitivity, kidney issues    VITALS:  Blood pressure 108/71, pulse 84, temperature 97.9 F (36.6 C), temperature source Axillary, resp. rate 16, height 5\' 3"  (1.6 m), weight 72.5 kg, SpO2 100 %.  PHYSICAL EXAMINATION:   Physical Exam   GENERAL:  85 y.o.-year-old patient lying in the bed with no acute distress. chronically ill.  EYES:. No scleral icterus.t.  HEENT: Head atraumatic, normocephalic. Marland Kitchen  NECK:  Supple, no jugular venous distention. No thyroid enlargement, no tenderness.  LUNGS: Normal breath sounds bilaterally, no wheezing, rales, rhonchi. No use of accessory muscles of respiration. Perm Cath +chest wall CARDIOVASCULAR: S1, S2 normal. No murmurs, rubs, or gallops.  ABDOMEN: Soft, nontender, nondistended. Bowel sounds present. No organomegaly or mass. PEG placed no signs of infection  EXTREMITIES: 2+ LEE  PSYCHIATRIC: Sleeping soundly  LABORATORY PANEL:  CBC Recent Labs  Lab 03/08/18 0255  WBC 7.6  HGB 8.1*  HCT 27.5*  PLT 158    Chemistries  Recent Labs  Lab 03/07/18 0425  03/13/18 0504  NA 140   < > 138  K 3.6   < > 3.7  CL 107   < > 100  CO2 27   < > 34*  GLUCOSE 106*   < > 201*  BUN 58*   < > 50*  CREATININE 2.11*   < > 1.98*  CALCIUM 8.1*   < > 8.5*  AST 248*  --   --   ALT 298*  --   --   ALKPHOS 144*  --   --   BILITOT 0.8  --   --    < > = values in this interval not displayed.   Cardiac Enzymes Recent Labs  Lab 03/11/18 2118  TROPONINI 0.91*   RADIOLOGY:  No  results found. ASSESSMENT AND PLAN:  Alyn Riedinger is an51 y.o.femalewith multiple comorbidities including diabetes type 2, hypertension, hyperlipidemia, coronary disease status post CABG, history of stroke, and CKD who was admitted on1/27/2020. Originally admitted to [place] [facility]withdysphagia and achalasiaand 50 pound weight loss in 12/2017.Status post treatment for ESBL Klebsiella aspiration pneumonia, enterococcus UTI. Admitted for dialysis.  1. Acute kidney injury on chronic kidney disease stage III. Uremia, and multiple other comorbidities likely causing altered mental status/acute metabolic encephalopathy - complicated by CHF, poor nutrition, cardio renal syndrome. - Nephrologyfollowing; Dialysis as per nephrology Son wants to have patient to eventually transition to outpatient dialysis.  2.Acute on chronic systolic EF 41% and diastolic congestive heart failure with moderate MR and bilateral pleural effusion with and Anasarca and hypoalbuminemia Improving with dialysis Stop metoprolol due to low blood pressure.   3.Dysphagia secondary to achalasia.  Continue PEG feedings as tolerated   4.Type II diabetes: Blood sugars have improved this morning with increased PEG feedings. Continue to monitor blood sugars Continue sliding scale insulin  5.History of left temporal and frontal punctate CVA -on Plavix and Lipitor  6.Status post non-STEMI -- continue Plavix and statin -had episode of chest  pain but troponins actually trending down likely chest pain was result of being moved and bathed   7. Lymph node within umbilical hernia, son declined core biopsyaccording to [place] clinicalnotesGeneral surgery [facility], no further recommendations at this time.-- Son has declined further workup according to [facility] cone notes  8. Deconditioning, frailty  Son decline Palliative care services  9.  Transient Afib in the setting of volume  overlaod -now in sinus rhythm. Seen by cardiology Dr. Fletcher Anon. Cardiology has signed off. -They do not recommend any anticoagulation no further cardiac workup  10. Vaginal discharge reported by nursing staff: Started on Chlortrimazole yesterday for a total of 3 days.   CODE STATUS: full  DVT Prophylaxis: family has declined to use heparin  TOTAL TIME TAKING CARE OF THIS PATIENT: 25 minutes.     >50% time spent on counselling and coordination of care  Note: This dictation was prepared with Dragon dictation along with smaller phrase technology. Any transcriptional errors that result from this process are unintentional.  Levante Simones M.D on 03/13/2018 at 12:08 PM  Between 7am to 6pm - Pager - [phone]  After 6pm go to [email or web address] - password [email or web address] [facility]  Office  [phone]  CC: Primary care physician; Deland Pretty, MDPatient ID: Verlon Au, female   DOB: 1927-10-10, 85 y.o.   MRN: [id number]

## 2018-03-14 LAB — CBC
HCT: 26.4 % — ABNORMAL LOW (ref 36.0–46.0)
Hemoglobin: 7.6 g/dL — ABNORMAL LOW (ref 12.0–15.0)
MCH: 30.4 pg (ref 26.0–34.0)
MCHC: 28.8 g/dL — ABNORMAL LOW (ref 30.0–36.0)
MCV: 105.6 fL — ABNORMAL HIGH (ref 80.0–100.0)
Platelets: 91 10*3/uL — ABNORMAL LOW (ref 150–400)
RBC: 2.5 MIL/uL — AB (ref 3.87–5.11)
RDW: 19.7 % — ABNORMAL HIGH (ref 11.5–15.5)
WBC: 5.6 10*3/uL (ref 4.0–10.5)
nRBC: 1.8 % — ABNORMAL HIGH (ref 0.0–0.2)

## 2018-03-14 LAB — BASIC METABOLIC PANEL
ANION GAP: 5 (ref 5–15)
BUN: 73 mg/dL — ABNORMAL HIGH (ref 8–23)
CO2: 33 mmol/L — ABNORMAL HIGH (ref 22–32)
Calcium: 8.5 mg/dL — ABNORMAL LOW (ref 8.9–10.3)
Chloride: 98 mmol/L (ref 98–111)
Creatinine, Ser: 2.3 mg/dL — ABNORMAL HIGH (ref 0.44–1.00)
GFR calc non Af Amer: 18 mL/min — ABNORMAL LOW (ref >60)
GFR, EST AFRICAN AMERICAN: 21 mL/min — AB (ref >60)
Glucose, Bld: 234 mg/dL — ABNORMAL HIGH (ref 70–99)
POTASSIUM: 3.9 mmol/L (ref 3.5–5.1)
Sodium: 136 mmol/L (ref 135–145)

## 2018-03-14 LAB — GLUCOSE, CAPILLARY
Glucose-Capillary: 145 mg/dL — ABNORMAL HIGH (ref 70–99)
Glucose-Capillary: 168 mg/dL — ABNORMAL HIGH (ref 70–99)
Glucose-Capillary: 184 mg/dL — ABNORMAL HIGH (ref 70–99)
Glucose-Capillary: 186 mg/dL — ABNORMAL HIGH (ref 70–99)
Glucose-Capillary: 204 mg/dL — ABNORMAL HIGH (ref 70–99)
Glucose-Capillary: 230 mg/dL — ABNORMAL HIGH (ref 70–99)
Glucose-Capillary: 254 mg/dL — ABNORMAL HIGH (ref 70–99)

## 2018-03-14 MED ORDER — INSULIN ASPART 100 UNIT/ML ~~LOC~~ SOLN
4.0000 [IU] | SUBCUTANEOUS | Status: DC
  Administered 2018-03-14 – 2018-03-15 (×4): 4 [IU] via SUBCUTANEOUS
  Filled 2018-03-14 (×4): qty 1

## 2018-03-14 NOTE — Progress Notes (Signed)
Updated daughter patient's status and plan of care for outpatient HD per CM note.   Fuller Mandril, RN

## 2018-03-14 NOTE — Progress Notes (Signed)
Post HD Tx    03/14/18 1145  Hand-Off documentation  Report given to (Full Name) Ruthy Dick, RN   Report received from (Full Name) Beatris Ship, RN   Vital Signs  Temp 98.6 F (37 C)  Temp Source Oral  Pulse Rate 91  Pulse Rate Source Monitor  Resp 17  BP (!) 120/54  BP Location Left Arm  BP Method Automatic  Patient Position (if appropriate) Lying  Oxygen Therapy  SpO2 100 %  O2 Device Nasal Cannula  O2 Flow Rate (L/min) 2 L/min  Pulse Oximetry Type Continuous  Pain Assessment  Pain Scale 0-10  Pain Score 0  Dialysis Weight  Weight 76.3 kg  Type of Weight Post-Dialysis

## 2018-03-14 NOTE — Care Management (Signed)
Per chart review it was determine that patient had an ICU stay from 2/4-2/7.  RNCM followed up with representative from [facility] to see if patient would now be a candidate for LTACH.  Per rep there are currently no HD beds, available and there are 3 patients waiting on beds. Per rep medicare guidelines patients no longer qualify with only the need for stretcher HD alone  Per [name] HD liaison Patient has an outpatient spot at [facility] [address].  TTS.  If patient were to start outpatient on Tuesday 2/18 she would need to arrive at 10:30 am.  Her normal time will be 12pm.  Per [name] prior to patient being able to go outpatient HD she will need to sit for 4 hours x2  Plan:  Patient will sit in room in chair 2/13 for 4 hours.  Patient will go to HD in chair 2/14. Son and MD aware  RNCM contacted son [name] via phone to discuss discharge planning.  [name] present for conversation.  [name] confirms his plan is for patient to return to her home at discharge  - he is aware the plan is for HD at discharge.  He would  like follow up from nephrology if PD is an option.  It has to  be determine if patient would qualify for PD with her tube  feedings, and if vascular would place a PD cath.  RNCM  has notified nephrology that son is awaiting this follow up  - he is in agreement for patient to sit in chair for 4 hours  on 2/13, and go to HD in chair on 2/14.  He is aware that  this must happen prior to patient being discharge and  going to an outpatient clinic.   - He is aware that he will be responsible for transportation  to and from outpatient HD. He states "we will be using  private transportation. It will most likely be [facility]".  He  declines any additional private transportation resources.   - Son states that he will be staying with the patient 24/7  and providing care after discharge. He declined any  information from [facility] on private duty nursing.  States "I  have nurses from [facility] on my payroll  should I need them"  - RNCM to provides son with PCS list  - Patient will require a reclining WC at discharge.  Son in  agreement.  Per [facility] there will need to  be an order, and narrative related to reclining WC  - Son confirmed that hospital bed, lift, and tube feedings  supplies were delivered to home.  Son states that her  feedings have been changed and [facility]  would have to have updated orders at discharge  - son confirms he wants [place] at  discharge.  [name] with [employer] is aware.  Son states that the patient will require RN, PT, speech,  and aide at discharge.  Declines OT   Interdisciplinary team meeting scheduled for Friday 2/14 at 9:00.  Son is aware and will be present.  Invite was extended to nephrology, HD coordinator, executive leadership, departmental leadership, CM leadership, CSW, chaplain and hospitalist.  Son declined for palliative to be present.

## 2018-03-14 NOTE — Progress Notes (Signed)
HD Tx started w/o complication   15/83/09 4076  Vital Signs  Temp 98.7 F (37.1 C)  Temp Source Oral  Pulse Rate 76  Pulse Rate Source Monitor  Resp 10  BP (!) 99/52  BP Location Left Arm  BP Method Automatic  Patient Position (if appropriate) Lying  Oxygen Therapy  SpO2 100 %  O2 Device Nasal Cannula  O2 Flow Rate (L/min) 2 L/min  Pain Assessment  Pain Scale Faces  Pain Score 0  Dialysis Weight  Weight 77.9 kg  Type of Weight Pre-Dialysis  Time-Out for Hemodialysis  What Procedure? HD  Pt Identifiers(min of two) First/Last Name;MRN/Account#  Correct Site? Yes  Correct Side? Yes  Correct Procedure? Yes  Consents Verified? Yes  Rad Studies Available? N/A  Safety Precautions Reviewed? Yes  Engineer, civil (consulting) Number 1  Station Number 2  UF/Alarm Test Passed  Conductivity: Meter 14  Conductivity: Machine  14.1  pH 7.4  Reverse Osmosis Main  Normal Saline Lot Number [id number]  Dialyzer Lot Number [id number]  Disposable Set Lot Number [id number]  Machine Temperature 98.6 F (37 C)  Musician and Audible Yes  Blood Lines Intact and Secured Yes  Pre Treatment Patient Checks  Vascular access used during treatment Catheter  Hepatitis B Surface Antigen Results Negative  Date Hepatitis B Surface Antigen Drawn 02/26/18  Hepatitis B Surface Antibody  (<10)  Date Hepatitis B Surface Antibody Drawn 02/26/18  Hemodialysis Consent Verified Yes  Hemodialysis Standing Orders Initiated Yes  ECG (Telemetry) Monitor On Yes  Prime Ordered Normal Saline  Length of  DialysisTreatment -hour(s) 3 Hour(s)  Dialysis Treatment Comments Na 140  Dialyzer Elisio 17H NR  Dialysate 3K, 2.5 Ca  Dialysis Anticoagulant None  Dialysate Flow Ordered 600  Blood Flow Rate Ordered 400 mL/min  Ultrafiltration Goal 0.5 Liters  Dialysis Blood Pressure Support Ordered Normal Saline  During Hemodialysis Assessment  Blood Flow Rate (mL/min) 400 mL/min  Arterial Pressure (mmHg) -160 mmHg   Venous Pressure (mmHg) 130 mmHg  Transmembrane Pressure (mmHg) 50 mmHg  Ultrafiltration Rate (mL/min) 440 mL/min  Dialysate Flow Rate (mL/min) 600 ml/min  Conductivity: Machine  14.1  HD Safety Checks Performed Yes  Dialysis Fluid Bolus Normal Saline  Bolus Amount (mL) 250 mL  Intra-Hemodialysis Comments Tx initiated  Education / Care Plan  Dialysis Education Provided Yes  Documented Education in Care Plan Yes  Hemodialysis Catheter Right Subclavian Double-lumen;Permanent  Placement Date: 03/05/18   Orientation: Right  Access Location: Subclavian  Hemodialysis Catheter Type: Double-lumen;Permanent  Site Condition No complications  Blue Lumen Status Flushed;Infusing  Red Lumen Status Flushed;Infusing  Purple Lumen Status N/A  Dressing Type Occlusive;Biopatch  Dressing Status Clean;Dry;Intact  Dressing Change Due 03/19/18

## 2018-03-14 NOTE — Progress Notes (Signed)
Inpatient Diabetes Program Recommendations  AACE/ADA: New Consensus Statement on Inpatient Glycemic Control  Target Ranges:  Prepandial:   less than 140 mg/dL      Peak postprandial:   less than 180 mg/dL (1-2 hours)      Critically ill patients:  140 - 180 mg/dL   Results for [name], [name] (MRN [id number]) as of 03/14/2018 09:26  Ref. Range 03/13/2018 07:31 03/13/2018 11:28 03/13/2018 15:37 03/13/2018 20:51 03/13/2018 22:02 03/14/2018 00:34 03/14/2018 04:33 03/14/2018 07:49  Glucose-Capillary Latest Ref Range: 70 - 99 mg/dL 184 (H) 160 (H) 159 (H) 213 (H) 219 (H) 254 (H) 230 (H) 204 (H)   Review of Glycemic Control  Diabetes history: DM2 Outpatient Diabetes medications: Glipizide XL 10 mg QAM Current orders for Inpatient glycemic control: Novolog 0-9 units Q4H; Osmolite @ 40 ml/hr  Inpatient Diabetes Program Recommendations:  Insulin - Tube Feeding Coverage: Please consider ordering Novolog 4 units Q4H for tube feeding coverage. If tube feeding is stopped or held then Novolog tube feeding should also be stopped or held.  Thanks, Barnie Alderman, RN, MSN, CDE Diabetes Coordinator Inpatient Diabetes Program [phone] (Team Pager from 8am to 5pm)

## 2018-03-14 NOTE — Progress Notes (Signed)
[place] [facility] [place] NAME: [name]    MR#:  [id number]  DATE OF BIRTH:  04/02/1927  SUBJECTIVE:  CHIEF COMPLAINT:  No chief complaint on file.  -Patient is confused, seen during dialysis.  Occasional screaming according to dialysis nurse.  REVIEW OF SYSTEMS:  Review of Systems  Unable to perform ROS: Mental status change    DRUG ALLERGIES:   Allergies  Allergen Reactions  . Reglan [Metoclopramide]     Pt's son reports this causes patient tremors and neurologic issues   . Contrast Media [Iodinated Diagnostic Agents]     Sensitivity, kidney issues    VITALS:  Blood pressure 129/69, pulse 86, temperature (!) 96.8 F (36 C), temperature source Axillary, resp. rate 16, height 5\' 3"  (1.6 m), weight 77.9 kg, SpO2 100 %.  PHYSICAL EXAMINATION:  Physical Exam  GENERAL:  85 y.o.-year-old elderly patient lying in the bed with no acute distress.  EYES: Pupils equal, round, reactive to light . No scleral icterus. Extraocular muscles intact.  HEENT: Head atraumatic, normocephalic. Oropharynx and nasopharynx clear.  NECK:  Supple, no jugular venous distention. No thyroid enlargement, no tenderness.  LUNGS: Normal breath sounds bilaterally, no wheezing, rales,rhonchi or crepitation. No use of accessory muscles of respiration.  Decreased bibasilar breath sounds Right chest permacath present CARDIOVASCULAR: S1, S2 normal. No murmurs, rubs, or gallops.  ABDOMEN: Soft, nontender, nondistended. Bowel sounds present. No organomegaly or mass.  EXTREMITIES: 2+ bilateral pedal edema noted.  No cyanosis, or clubbing.  NEUROLOGIC: Cranial nerves II through XII are intact.  She has dense left-sided hemiplegia.  Able to move her right arm, minimal movement in the right leg noted.  Sensation seems to be intact on the right side . Gait not checked.  PSYCHIATRIC: The patient is alert but not oriented.  SKIN: No obvious rash, lesion, or ulcer.     LABORATORY PANEL:   CBC Recent Labs  Lab 03/14/18 0508  WBC 5.6  HGB 7.6*  HCT 26.4*  PLT 91*   ------------------------------------------------------------------------------------------------------------------  Chemistries  Recent Labs  Lab 03/14/18 0508  NA 136  K 3.9  CL 98  CO2 33*  GLUCOSE 234*  BUN 73*  CREATININE 2.30*  CALCIUM 8.5*   ------------------------------------------------------------------------------------------------------------------  Cardiac Enzymes Recent Labs  Lab 03/11/18 2118  TROPONINI 0.91*   ------------------------------------------------------------------------------------------------------------------  RADIOLOGY:  No results found.  EKG:   Orders placed or performed during the hospital encounter of 02/26/18  . EKG 12-Lead  . EKG 12-Lead  . EKG 12-Lead  . EKG 12-Lead    ASSESSMENT AND PLAN:   [name] is an85 y.o.femalewith multiple comorbidities including diabetes type 2, hypertension, hyperlipidemia, coronary disease status post CABG, history of stroke, and CKD who was admitted on1/27/2020. Originally admitted to [place] [facility]withdysphagia and achalasiaand 50 pound weight loss in 12/2017.Status post treatment for ESBL Klebsiella aspiration pneumonia, enterococcus UTI. Admitted for dialysis.  1.  Acute renal failure on CKD stage III-admitted with uremia and encephalopathy -Started on hemodialysis about 2 weeks ago.  No recovering renal function noted. -Appreciate nephrology consult.  Patient will need dialysis at this time. -Working on setting up outpatient dialysis.  Patient has a right chest permacath -Receiving IV albumin during dialysis as needed  2.  Acute on chronic combined heart failure-echo with moderate mitral regurgitation and bilateral pleural effusion.  Presented with anasarca and hypoalbuminemia -Volume overload improving with dialysis. -Patient has finished her antibiotics for  aspiration pneumonia.  3.  Dysphagia  secondary to achalasia-initial reason for admission as she failed Botox injections. -Currently status post PEG tube and tube feeds  4.  Diabetes mellitus-appreciate diabetes coordinator's input.  Started on NovoLog every 4 hours while on tube feeds. -Continue sliding scale  5.  Left temporal and frontal CVA-on Plavix and statin  6.  Anemia-likely anemia of chronic disease . -Next visit if hemoglobin is less than 7.  Continue EPO with dialysis.  7.  Acute encephalopathy-initially was in uremic encephalopathy with decreased mentation.  Now more alert but confused.  Underlying delirium cannot be ruled out.  Continue to monitor.  Physical therapy consult to see if patient will be able to sit   All the records are reviewed and case discussed with Care Management/Social Workerr. Management plans discussed with the patient, family and they are in agreement.  CODE STATUS: Full code  TOTAL TIME TAKING CARE OF THIS PATIENT: 39 minutes.   POSSIBLE D/C IN ?  DAYS, DEPENDING ON CLINICAL CONDITION.   Gladstone Lighter M.D on 03/14/2018 at 1:11 PM  Between 7am to 6pm - Pager - [phone]  After 6pm go to [email or web address] - password [email or web address] [facility]  Office  [phone]  CC: Primary care physician; Darlene Pretty, MD

## 2018-03-14 NOTE — Progress Notes (Signed)
Post HD Assessment    03/14/18 1145  Neurological  Level of Consciousness Responds to Voice  Orientation Level Disoriented X4  Respiratory  Respiratory Pattern Regular;Unlabored  Chest Assessment Chest expansion symmetrical  Bilateral Breath Sounds Clear;Diminished  Cough None  Cardiac  Pulse Regular  Heart Sounds S1, S2  ECG Monitor Yes  Vascular  R Radial Pulse +1  L Radial Pulse +1  Edema Generalized  Generalized Edema +2  Psychosocial  Psychosocial (WDL) WDL  Patient Behaviors Calm

## 2018-03-14 NOTE — Progress Notes (Signed)
[facility]  ROUNDING NOTE   Subjective:   Hemodialysis treatment yesterday. Tolerated treatment well. However patient is screaming out and moaning through out treatment. Patient unable to be oriented.     HEMODIALYSIS FLOWSHEET:  Blood Flow Rate (mL/min): 400 mL/min Arterial Pressure (mmHg): -160 mmHg Venous Pressure (mmHg): 130 mmHg Transmembrane Pressure (mmHg): 50 mmHg Ultrafiltration Rate (mL/min): 440 mL/min Dialysate Flow Rate (mL/min): 600 ml/min Conductivity: Machine : 14.1 Conductivity: Machine : 14.1 Dialysis Fluid Bolus: Normal Saline Bolus Amount (mL): 250 mL Dialysate Change: (Na 140)    Objective:  Vital signs in last 24 hours:  Temp:  [97.8 F (36.6 C)-98.9 F (37.2 C)] 98.7 F (37.1 C) (02/12 0830) Pulse Rate:  [47-84] 59 (02/12 1015) Resp:  [0-20] 20 (02/12 1015) BP: (82-108)/(44-68) 106/68 (02/12 1015) SpO2:  [100 %] 100 % (02/12 1015) Weight:  [77.9 kg] 77.9 kg (02/12 0830)  Weight change:  Filed Weights   03/12/18 1200 03/13/18 0500 03/14/18 0830  Weight: 76.3 kg 72.5 kg 77.9 kg    Intake/Output: I/O last 3 completed shifts: In: 940 [Other:80; NG/GT:860] Out: -    Intake/Output this shift:  No intake/output data recorded.  Physical Exam: General: NAD,   Head: Normocephalic, atraumatic. Moist oral mucosal membranes  Eyes: Anicteric, PERRL  Neck: Supple, trachea midline  Lungs:  Clear to auscultation  Heart: Regular rate and rhythm  Abdomen:  Soft, nontender, +G-tube  Extremities:  noperipheral edema.  Neurologic: Alert to self  Skin: No lesions  Access: RIJ permcath    Basic Metabolic Panel: Recent Labs  Lab 03/08/18 1454 03/09/18 0953 03/10/18 2000 03/11/18 0646 03/12/18 0440 03/13/18 0504 03/14/18 0508  NA  --  139  --  138 137 138 136  K  --  3.8  --  3.8 3.7 3.7 3.9  CL  --  100  --  98 98 100 98  CO2  --  29  --  31 32 34* 33*  GLUCOSE  --  189*  --  197* 87 201* 234*  BUN  --  52*  --  53* 76* 50*  73*  CREATININE  --  2.16*  --  1.85* 2.23* 1.98* 2.30*  CALCIUM  --  8.9  --  8.7* 8.8* 8.5* 8.5*  PHOS 3.2  --  1.7*  --   --   --   --     Liver Function Tests: No results for input(s): AST, ALT, ALKPHOS, BILITOT, PROT, ALBUMIN in the last 168 hours. No results for input(s): LIPASE, AMYLASE in the last 168 hours. No results for input(s): AMMONIA in the last 168 hours.  CBC: Recent Labs  Lab 03/08/18 0255 03/14/18 0508  WBC 7.6 5.6  HGB 8.1* 7.6*  HCT 27.5* 26.4*  MCV 101.9* 105.6*  PLT 158 91*    Cardiac Enzymes: Recent Labs  Lab 03/08/18 1454 03/08/18 2144 03/11/18 1030 03/11/18 1629 03/11/18 2118  TROPONINI 1.00* 1.07* 1.00* 0.90* 0.91*    BNP: Invalid input(s): POCBNP  CBG: Recent Labs  Lab 03/13/18 2051 03/13/18 2202 03/14/18 0034 03/14/18 0433 03/14/18 0749  GLUCAP 213* 219* 254* 230* 204*    Microbiology: Results for orders placed or performed during the hospital encounter of 02/26/18  MRSA PCR Screening     Status: Abnormal   Collection Time: 02/26/18  5:39 PM  Result Value Ref Range Status   MRSA by PCR POSITIVE (A) NEGATIVE Final    Comment:        The GeneXpert MRSA  Assay (FDA approved for NASAL specimens only), is one component of a comprehensive MRSA colonization surveillance program. It is not intended to diagnose MRSA infection nor to guide or monitor treatment for MRSA infections. CRITICAL RESULT CALLED TO, READ BACK BY AND VERIFIED WITH:  CALLED TO DANA DUGGINS RN @1920  Performed at [facility], [address] [place], [place] [address]   CULTURE, BLOOD (ROUTINE X 2) w Reflex to ID Panel     Status: None   Collection Time: 03/06/18  9:01 PM  Result Value Ref Range Status   Specimen Description BLOOD LEFT ANTECUBITAL  Final   Special Requests   Final    BOTTLES DRAWN AEROBIC ONLY Blood Culture adequate volume   Culture   Final    NO GROWTH 5 DAYS Performed at [facility], [address]  [place], [place] [address]    Report Status 03/11/2018 FINAL  Final  CULTURE, BLOOD (ROUTINE X 2) w Reflex to ID Panel     Status: None   Collection Time: 03/06/18  9:37 PM  Result Value Ref Range Status   Specimen Description BLOOD LEFT ANTECUBITAL  Final   Special Requests   Final    BOTTLES DRAWN AEROBIC AND ANAEROBIC Blood Culture adequate volume   Culture   Final    NO GROWTH 5 DAYS Performed at [facility] [address], [place], [place] [address]    Report Status 03/11/2018 FINAL  Final    Coagulation Studies: No results for input(s): LABPROT, INR in the last 72 hours.  Urinalysis: No results for input(s): COLORURINE, LABSPEC, PHURINE, GLUCOSEU, HGBUR, BILIRUBINUR, KETONESUR, PROTEINUR, UROBILINOGEN, NITRITE, LEUKOCYTESUR in the last 72 hours.  Invalid input(s): APPERANCEUR    Imaging: No results found.   Medications:   . albumin human 25 g (03/10/18 1946)  . feeding supplement (OSMOLITE 1.5 CAL) 1,000 mL (03/13/18 1233)   . atorvastatin  40 mg Per Tube q1800  . B-complex with vitamin C  1 tablet Per Tube Daily  . chlorhexidine  15 mL Mouth Rinse BID  . clopidogrel  75 mg Per Tube QHS  . clotrimazole  1 Applicatorful Vaginal QHS  . collagenase   Topical Daily  . epoetin (EPOGEN/PROCRIT) injection  10,000 Units Intravenous Q M,W,F-HD  . famotidine  10 mg Per Tube Daily  . feeding supplement (PRO-STAT SUGAR FREE 64)  30 mL Per Tube BID  . free water  20 mL Per Tube Q4H  . insulin aspart  0-9 Units Subcutaneous Q4H  . mouth rinse  15 mL Mouth Rinse q12n4p  . multivitamin  15 mL Per Tube QHS  . nutrition supplement (JUVEN)  1 packet Per Tube BID BM  . pantoprazole (PROTONIX) IV  40 mg Intravenous Daily  . polyethylene glycol  17 g Oral Daily   acetaminophen (TYLENOL) oral liquid 160 mg/5 mL **OR** acetaminophen **OR** acetaminophen, albumin human, albuterol, ipratropium, lip balm, Melatonin, nitroGLYCERIN, ondansetron, promethazine  Assessment/ Plan:   Ms. Chandel Zaun is a 85 y.o. black female with diabetes mellitus type II, hypertension, hyperlipidemia, coronary disease status post CABG, history of stroke, CKD, was admitted on1/27/2020. Originally admitted to [place] [facility]with Achalasiaand 50 pound weight loss, dysarthria since November 2019.   1. Acute kidney injury on chronic kidney disease stage III. Baseline creatinine probably 1.1 from Nov 2019 Requiring hemodialysis. Concern for uremic encephalopathy.  No signs of renal recovery.  Will Continue MWF schedule Currently unable to sit in a chair. Will call patient's family to see  what the next steps are.   2. Hypotension: blood pressure low. Consider midodrine.   3. Anemia of chronic kidney disease: hemoglobin 7.6 Macrocytic.  - EPO with HD treatment  4. Malnutrition with achalasia: G tube placed. Tolerating tube feeds.  Albumin 1.8 - IV albumin with HD treatment    LOS: Alexis 2/12/202012:07 PM

## 2018-03-14 NOTE — Progress Notes (Addendum)
Pre HD Assessment    03/14/18 0820  Neurological  Level of Consciousness Responds to Voice  Orientation Level Disoriented X4  Respiratory  Respiratory Pattern Regular;Unlabored  Chest Assessment Chest expansion symmetrical  Bilateral Breath Sounds Diminished  Cardiac  Pulse Regular  Heart Sounds S1, S2  ECG Monitor Yes  Vascular  R Radial Pulse +1  L Radial Pulse +1  Edema Generalized  Generalized Edema +2  Psychosocial  Psychosocial (WDL) WDL  Patient Behaviors Calm

## 2018-03-14 NOTE — Progress Notes (Signed)
HD TX completed, tolerated well, Uf goal met.    03/14/18 1141  Vital Signs  Pulse Rate 92  Pulse Rate Source Monitor  Resp (!) 26  BP 121/69  BP Location Left Arm  BP Method Automatic  Patient Position (if appropriate) Lying  Oxygen Therapy  SpO2 100 %  O2 Device Nasal Cannula  O2 Flow Rate (L/min) 2 L/min  Pulse Oximetry Type Continuous  During Hemodialysis Assessment  Blood Flow Rate (mL/min) 400 mL/min  Arterial Pressure (mmHg) -160 mmHg  Venous Pressure (mmHg) 130 mmHg  Transmembrane Pressure (mmHg) 50 mmHg  Ultrafiltration Rate (mL/min) 440 mL/min  Dialysate Flow Rate (mL/min) 600 ml/min  Conductivity: Machine  14  HD Safety Checks Performed Yes  Intra-Hemodialysis Comments Tx completed;Tolerated well   

## 2018-03-15 ENCOUNTER — Inpatient Hospital Stay: Payer: Medicare Other

## 2018-03-15 LAB — GLUCOSE, CAPILLARY
GLUCOSE-CAPILLARY: 254 mg/dL — AB (ref 70–99)
Glucose-Capillary: 215 mg/dL — ABNORMAL HIGH (ref 70–99)
Glucose-Capillary: 249 mg/dL — ABNORMAL HIGH (ref 70–99)
Glucose-Capillary: 216 mg/dL — ABNORMAL HIGH (ref 70–99)
Glucose-Capillary: 191 mg/dL — ABNORMAL HIGH (ref 70–99)

## 2018-03-15 LAB — MISC LABCORP TEST (SEND OUT): Labcorp test code: 10447

## 2018-03-15 MED ORDER — CLOTRIMAZOLE 2 % VA CREA
1.0000 | TOPICAL_CREAM | Freq: Once | VAGINAL | Status: DC
  Filled 2018-03-15 (×2): qty 21

## 2018-03-15 MED ORDER — INSULIN ASPART 100 UNIT/ML ~~LOC~~ SOLN
4.0000 [IU] | SUBCUTANEOUS | Status: DC
  Administered 2018-03-15 – 2018-03-16 (×7): 4 [IU] via SUBCUTANEOUS
  Filled 2018-03-15 (×7): qty 1

## 2018-03-15 MED ORDER — HALOPERIDOL LACTATE 5 MG/ML IJ SOLN
2.5000 mg | Freq: Four times a day (QID) | INTRAMUSCULAR | Status: DC | PRN
  Filled 2018-03-15: qty 0.5

## 2018-03-15 MED ORDER — SODIUM CHLORIDE 0.9 % IV BOLUS
250.0000 mL | Freq: Once | INTRAVENOUS | Status: AC
  Administered 2018-03-15: 250 mL via INTRAVENOUS

## 2018-03-15 MED ORDER — CLOTRIMAZOLE 1 % VA CREA
1.0000 | TOPICAL_CREAM | Freq: Every day | VAGINAL | Status: AC
  Administered 2018-03-15 – 2018-03-21 (×7): 1 via VAGINAL
  Filled 2018-03-15: qty 45

## 2018-03-15 NOTE — Progress Notes (Signed)
[facility]  ROUNDING NOTE   Subjective:   Hemodialysis treatment yesterday. Screamed and moaned through out treatment.   Patient was unable to transfer to chair  Objective:  Vital signs in last 24 hours:  Temp:  [96.8 F (36 C)-97.6 F (36.4 C)] 97.6 F (36.4 C) (02/13 0420) Pulse Rate:  [86-107] 106 (02/13 0420) Resp:  [16-22] 22 (02/13 0420) BP: (121-129)/(68-87) 126/68 (02/13 0420) SpO2:  [97 %-100 %] 97 % (02/13 0420) Weight:  [78.4 kg] 78.4 kg (02/13 0500)  Weight change:  Filed Weights   03/14/18 0830 03/14/18 1145 03/15/18 0500  Weight: 77.9 kg 76.3 kg 78.4 kg    Intake/Output: I/O last 3 completed shifts: In: 2148 [NG/GT:2148] Out: 0    Intake/Output this shift:  No intake/output data recorded.  Physical Exam: General: NAD,   Head: Normocephalic, atraumatic. Moist oral mucosal membranes  Eyes: Anicteric, PERRL  Neck: Supple, trachea midline  Lungs:  Clear to auscultation  Heart: Regular rate and rhythm  Abdomen:  Soft, nontender, +G-tube  Extremities: no peripheral edema.  Neurologic: Alert to self  Skin: No lesions  Access: RIJ permcath    Basic Metabolic Panel: Recent Labs  Lab 03/08/18 1454  03/09/18 0953 03/10/18 2000 03/11/18 0646 03/12/18 0440 03/13/18 0504 03/14/18 0508  NA  --   --  139  --  138 137 138 136  K  --   --  3.8  --  3.8 3.7 3.7 3.9  CL  --   --  100  --  98 98 100 98  CO2  --   --  29  --  31 32 34* 33*  GLUCOSE  --   --  189*  --  197* 87 201* 234*  BUN  --   --  52*  --  53* 76* 50* 73*  CREATININE  --   --  2.16*  --  1.85* 2.23* 1.98* 2.30*  CALCIUM  --    < > 8.9  --  8.7* 8.8* 8.5* 8.5*  PHOS 3.2  --   --  1.7*  --   --   --   --    < > = values in this interval not displayed.    Liver Function Tests: No results for input(s): AST, ALT, ALKPHOS, BILITOT, PROT, ALBUMIN in the last 168 hours. No results for input(s): LIPASE, AMYLASE in the last 168 hours. No results for input(s): AMMONIA in the  last 168 hours.  CBC: Recent Labs  Lab 03/14/18 0508  WBC 5.6  HGB 7.6*  HCT 26.4*  MCV 105.6*  PLT 91*    Cardiac Enzymes: Recent Labs  Lab 03/08/18 1454 03/08/18 2144 03/11/18 1030 03/11/18 1629 03/11/18 2118  TROPONINI 1.00* 1.07* 1.00* 0.90* 0.91*    BNP: Invalid input(s): POCBNP  CBG: Recent Labs  Lab 03/14/18 1955 03/14/18 2329 03/15/18 0416 03/15/18 0803 03/15/18 1136  GLUCAP 184* 168* 215* 29* 254*    Microbiology: Results for orders placed or performed during the hospital encounter of 02/26/18  MRSA PCR Screening     Status: Abnormal   Collection Time: 02/26/18  5:39 PM  Result Value Ref Range Status   MRSA by PCR POSITIVE (A) NEGATIVE Final    Comment:        The GeneXpert MRSA Assay (FDA approved for NASAL specimens only), is one component of a comprehensive MRSA colonization surveillance program. It is not intended to diagnose MRSA infection nor to guide or monitor treatment for MRSA  infections. CRITICAL RESULT CALLED TO, READ BACK BY AND VERIFIED WITH:  CALLED TO Darlene DUGGINS RN @1920  Performed at [facility], [address] [place], [place] [address]   CULTURE, BLOOD (ROUTINE X 2) w Reflex to ID Panel     Status: None   Collection Time: 03/06/18  9:01 PM  Result Value Ref Range Status   Specimen Description BLOOD LEFT ANTECUBITAL  Final   Special Requests   Final    BOTTLES DRAWN AEROBIC ONLY Blood Culture adequate volume   Culture   Final    NO GROWTH 5 DAYS Performed at [facility], [address] [place], [place] [address]    Report Status 03/11/2018 FINAL  Final  CULTURE, BLOOD (ROUTINE X 2) w Reflex to ID Panel     Status: None   Collection Time: 03/06/18  9:37 PM  Result Value Ref Range Status   Specimen Description BLOOD LEFT ANTECUBITAL  Final   Special Requests   Final    BOTTLES DRAWN AEROBIC AND ANAEROBIC Blood Culture adequate volume   Culture   Final    NO GROWTH 5 DAYS Performed at  [facility], [address], [place], [place] [address]    Report Status 03/11/2018 FINAL  Final    Coagulation Studies: No results for input(s): LABPROT, INR in the last 72 hours.  Urinalysis: No results for input(s): COLORURINE, LABSPEC, PHURINE, GLUCOSEU, HGBUR, BILIRUBINUR, KETONESUR, PROTEINUR, UROBILINOGEN, NITRITE, LEUKOCYTESUR in the last 72 hours.  Invalid input(s): APPERANCEUR    Imaging: No results found.   Medications:   . albumin human 25 g (03/10/18 1946)  . feeding supplement (OSMOLITE 1.5 CAL) 1,000 mL (03/14/18 2345)   . atorvastatin  40 mg Per Tube q1800  . B-complex with vitamin C  1 tablet Per Tube Daily  . chlorhexidine  15 mL Mouth Rinse BID  . clopidogrel  75 mg Per Tube QHS  . clotrimazole  1 Applicatorful Vaginal QHS  . collagenase   Topical Daily  . epoetin (EPOGEN/PROCRIT) injection  10,000 Units Intravenous Q M,W,F-HD  . famotidine  10 mg Per Tube Daily  . feeding supplement (PRO-STAT SUGAR FREE 64)  30 mL Per Tube BID  . free water  20 mL Per Tube Q4H  . insulin aspart  0-9 Units Subcutaneous Q4H  . insulin aspart  4 Units Subcutaneous Q4H  . mouth rinse  15 mL Mouth Rinse q12n4p  . multivitamin  15 mL Per Tube QHS  . nutrition supplement (JUVEN)  1 packet Per Tube BID BM  . pantoprazole (PROTONIX) IV  40 mg Intravenous Daily  . polyethylene glycol  17 g Oral Daily   acetaminophen (TYLENOL) oral liquid 160 mg/5 mL **OR** acetaminophen **OR** acetaminophen, albumin human, albuterol, ipratropium, lip balm, Melatonin, nitroGLYCERIN, ondansetron, promethazine  Assessment/ Plan:  [name] is a 85 y.o. black female with diabetes mellitus type II, hypertension, hyperlipidemia, coronary disease status post CABG, history of stroke, CKD, was admitted on1/27/2020. [facility] [place] [facility] Ach[facility]asiaand 50 pound weight loss, dysarthria since November 2019.   1. Acute kidney injury on chronic kidney disease  stage III. Baseline creatinine probably 1.1 from Nov 2019 Requiring hemodialysis. Concern for uremic encephalopathy.  No signs of renal recovery.  Will Continue MWF schedule Currently unable to sit in a chair. Without ability to sit all outpatient modalities are not available.  In order to do in-center hemodialysis: patient will be required to be seated for four hours and safely be  transferred from wheelchair to chair.  In order to do home hemodialysis: Patient's family will need to transport patient to and from [place], where the Home hemodialysis training is done, several times over a 4 week period.  Peritoneal dialysis is risky due to gastric tube which may cause infection. Also to place a peritoneal dialysis catheter, this is usually done under general anesthesia. Patient is not a candidate for general anesthesia. Next option would be to place peritoneal dialysis catheter under fluoroscopy. This is less than ideal in placement. Peritoneal dialysis would need training and patient will need to be transported to and from [facility] in [place] where the peritoneal dialysis clinic is.  All three are not good options.  Family meeting for tomorrow. Will discuss with family at meeting about our limited choices.  2. Hypotension: blood pressure low. Consider midodrine.   3. Anemia of chronic kidney disease:  Macrocytic.  - EPO with HD treatment  4. Malnutrition with achalasia: G tube placed. Tolerating tube feeds.  Albumin 1.8 - IV albumin with HD treatment    LOS: [place] 2/13/202012:19 PM

## 2018-03-15 NOTE — Care Management (Signed)
Goal was for patient to be transferred from bed to chair and sit for total of 4 hours.   RNCM met with PT at bedside.  At that time passive range of motion was being performed.  At that time RR was increased and patient was moaning.   While waiting for hoyer lift to be brought room patient began to vomit. RN was notified.  MD notified, and arrived at bedside.  It was determined by MD at that time that patient would not tolerate being transferred from chair via lift and sitting for 4 hours.

## 2018-03-15 NOTE — Care Management (Signed)
Spoke with son Mr. Boyan and confirmed meeting time for tomorrow morning at 9:15 am.  Staff will meet at 9am to discuss case

## 2018-03-15 NOTE — Progress Notes (Signed)
Patient screams out during the night into the morning hours.  Gave patient liquid tylenol hoping it would calm her down.  Will call doctor for recommendation.  Christene Slates  03/15/2018  4:40 AM

## 2018-03-15 NOTE — Progress Notes (Signed)
Patient was able to spell pig when responding to son.  Patient also knew addition problems such as 4+4=8, 8+8=16.  Will continue to monitor.  Christene Slates  03/15/2018  1:46 AM

## 2018-03-15 NOTE — Evaluation (Addendum)
Physical Therapy Evaluation Patient Details Name: [name] MRN: [id number] DOB: [birth year] Today's Date: 03/15/2018   History of Present Illness  patient admitted to facility as transfer from [facility] for trial of dialysis and ongoing care.  Patient initially presented to [facility] 11/5 due to UTI, AMS, achalasia (causing significant weight loss); extended hospital course complicated by multiple medical comorbidities/situations including g-tube placement (11/10), respiratory failure requiring intubation (12/2-12/6), PNA, acute metabolic encephalopathy, AKI and multiple cerebral infarcts.  Since admission to this facility (1/27), patient with R temp fem cath (placed 1/27, now removed), converted to R IJ perm-cath; also noted with RR event resulting in transfer to CCU for hypotension (requiring pressors).  Currently on med-surg floor with g-tube, supplemental O2 (2L) and R UE PICC.,  Clinical Impression  Patient resting with eyes closed upon arrival to room.  Does open eyes, but only intermittently attends to and follows therapist.  Unable to meaningfully communicate, answer questions or follow commands.  Attempted bilat UE/LE ROM assessment and therex; however, patient generally resistant to all movement attempts and consistently indicating pain, dislike of movement attempts (constantly whimpering, moaning).  Increased work of breathing evident even with very minimal exertion; however, vitals remain stable on 2L supplemental O2. Therapist was requested to attempt hoyer lift transfer to chair to assess ability to tolerate outpatient dialysis slot.  Expressed concern with patient's ability to tolerate mobility necessary to safely facilitate transfer, given patient's visible inability to tolerate even passive ROM at this time.  Physician at bedside to clear continued activity and to supervise given medical complexities and potential demands of transfer; RNCM/CSW/RN present to observe and assist as needed as well.   While preparing room for transfer, patient spontaneously noted with active vomiting.  As patient would be required to lay flat for sling placement, transfer attempt halted at this time due to risk of aspiration (MD aware and in agreement).  Patient unable to tolerate further attempts at this time. Will continue PT efforts as guided by medical team; however, note significant concern over patient's ability to participate and progress with skilled interventions.  Will monitor response to treatment closely and modify treatment plan, mobility attempts and overall involvement with patient as medically appropriate.    Follow Up Recommendations (Long-term care)    Equipment Recommendations  (hospital bed)    Recommendations for Other Services       Precautions / Restrictions Precautions Precautions: Fall Precaution Comments: NPO Restrictions Weight Bearing Restrictions: No      Mobility  Bed Mobility               General bed mobility comments: unsafe/unable to tolerate due to active vomiting with even very minimal exertion  Transfers                 General transfer comment: unsafe/unable to tolerate due to active vomiting with even very minimal exertion  Ambulation/Gait             General Gait Details: unsafe/unable to tolerate due to active vomiting with even very minimal exertion  Stairs            Wheelchair Mobility    Modified Rankin (Stroke Patients Only)       Balance                                             Pertinent Vitals/Pain Pain Assessment:  Faces Faces Pain Scale: Hurts even more Pain Location: bilat LEs, abdomen? Pain Descriptors / Indicators: Grimacing;Guarding(whimpering, moaning) Pain Intervention(s): Limited activity within patient's tolerance;Monitored during session;Repositioned    Home Living                   Additional Comments: Patient unable to verbalize/provide accurate information; family  not available.  Will verify with family as available.    Prior Function           Comments: Patient unable to verbalize/provide accurate information; family not available.  Will verify with family as available.     Hand Dominance        Extremity/Trunk Assessment   Upper Extremity Assessment Upper Extremity Assessment: Generalized weakness(patient generally resistant to all passive ROM attempts.  Shoulder elevation to aprox 70-80 degrees (with max encoruagement, facilitation), elbow, wrist and hand ROM grossly WFL.  Strength globally deconditioned, at least 2-/5)    Lower Extremity Assessment Lower Extremity Assessment: Generalized weakness(patient generally resistant to all passive ROM attempts.  LEs globally deconditioned with trace active movement noted (unsure of full, active ability due to limited participation with session); at least 2-/5 in hips given ability to resist at times. )       Communication   Communication: (patient unable to formulate/verbalize coherent words or participate with meaningful conversation)  Cognition Arousal/Alertness: Lethargic Behavior During Therapy: Flat affect Overall Cognitive Status: No family/caregiver present to determine baseline cognitive functioning                                 General Comments: Patient intermittently visually tracks and attends to therapist; does not attempt to meaningfully communicate, answer questions or follow commands.  Generally resistive to care      General Comments      Exercises Other Exercises Other Exercises: Passive ROM to bilat UE/LEs x5 reps for strength/ROM.  Bilat LE ROM limited by clinical indicators of pain; patient consistently whimpering and resisting movement throughout attempts.  Increased work of breathing evident with minimal activity; vitals remain WFL.   Assessment/Plan    PT Assessment Patient needs continued PT services(if able to participate/tolerate exertional  activity)  PT Problem List Decreased strength;Decreased range of motion;Decreased activity tolerance;Decreased balance;Decreased mobility;Decreased coordination;Decreased cognition;Decreased knowledge of use of DME;Decreased safety awareness;Cardiopulmonary status limiting activity;Decreased skin integrity;Pain       PT Treatment Interventions DME instruction;Gait training;Functional mobility training;Therapeutic exercise;Therapeutic activities;Balance training;Cognitive remediation;Neuromuscular re-education;Patient/family education    PT Goals (Current goals can be found in the Care Plan section)  Acute Rehab PT Goals PT Goal Formulation: Patient unable to participate in goal setting Time For Goal Achievement: 03/29/18 Potential to Achieve Goals: Fair Additional Goals Additional Goal #1: Assess and establish goals for OOB activities as medically appropriate.    Frequency Min 2X/week   Barriers to discharge Decreased caregiver support      Co-evaluation               AM-PAC PT "6 Clicks" Mobility  Outcome Measure Help needed turning from your back to your side while in a flat bed without using bedrails?: Total Help needed moving from lying on your back to sitting on the side of a flat bed without using bedrails?: Total Help needed moving to and from a bed to a chair (including a wheelchair)?: Total Help needed standing up from a chair using your arms (e.g., wheelchair or bedside chair)?: Total Help needed to walk  in hospital room?: Total Help needed climbing 3-5 steps with a railing? : Total 6 Click Score: 6    End of Session   Activity Tolerance: Treatment limited secondary to medical complications (Comment)(active vomiting, increased work of breathing) Patient left: in bed;with call bell/phone within reach;with bed alarm set;with nursing/sitter in room Nurse Communication: Mobility status PT Visit Diagnosis: Muscle weakness (generalized) (M62.81);Pain;Adult, failure to  thrive (R62.7)    Time: 1610-9604 PT Time Calculation (min) (ACUTE ONLY): 57 min   Charges:   PT Evaluation $PT Eval High Complexity: 1 High PT Treatments $Therapeutic Exercise: 8-22 mins $Therapeutic Activity: 8-22 mins       Emmily Pellegrin H. Owens Shark, PT, DPT, NCS 03/15/18, 5:37 PM [phone]

## 2018-03-15 NOTE — Progress Notes (Signed)
Physical therapy try to work with pt this morning. To try to place pt in the chair, but the pt started to vomit. The tube feeding were stop before physical therapy started to work with pt. Pt's son call the unit to find out if the tube feedings were stop before physical therapy started working with pt. RN stated tube feeds were stop. He said that maybe tomorrow we can stop tube feedings an hour early before physical therapy works with her, to prevent her from vomiting. RN will pass the pt's son message to the upcoming nurse. Will continue to assess and monitor pt.

## 2018-03-15 NOTE — Progress Notes (Signed)
Inpatient Diabetes Program Recommendations  AACE/ADA: New Consensus Statement on Inpatient Glycemic Control   Target Ranges:  Prepandial:   less than 140 mg/dL      Peak postprandial:   less than 180 mg/dL (1-2 hours)      Critically ill patients:  140 - 180 mg/dL   Results for CLYTEE, HEINRICH (MRN [id number]) as of 03/15/2018 11:53  Ref. Range 03/14/2018 07:49 03/14/2018 13:36 03/14/2018 16:30 03/14/2018 19:55 03/14/2018 23:29 03/15/2018 04:16 03/15/2018 08:03 03/15/2018 11:36  Glucose-Capillary Latest Ref Range: 70 - 99 mg/dL 204 (H) 145 (H) 186 (H) 184 (H) 168 (H) 215 (H) 249 (H) 254 (H)   Review of Glycemic Control  Diabetes history: DM2 Outpatient Diabetes medications: Glipizide XL 10 mg QAM Current orders for Inpatient glycemic control: Novolog 0-9 units Q4H, Novolog 4 units Q4H; Osmolite @ 40 ml/hr  Inpatient Diabetes Program Recommendations:  Insulin - Tube Feeding Coverage: Please consider increasing tube feeding coverage to Novolog 5 units Q4H. If tube feeding is stopped or held then Novolog tube feeding should also be stopped or held.  Thanks, Barnie Alderman, RN, MSN, CDE Diabetes Coordinator Inpatient Diabetes Program [phone] (Team Pager from 8am to 5pm)

## 2018-03-15 NOTE — Progress Notes (Signed)
[place] [facility] [place] NAME: [name]    MR#:  [id number]  DATE OF BIRTH:  14-May-1927  SUBJECTIVE:  CHIEF COMPLAINT:  No chief complaint on file.  -Patient is alert and confused, following some commands..  Occasional screaming according to  nurse.  REVIEW OF SYSTEMS:  Review of Systems  Unable to perform ROS: Mental status change    DRUG ALLERGIES:   Allergies  Allergen Reactions  . Reglan [Metoclopramide]     Pt's son reports this causes patient tremors and neurologic issues   . Contrast Media [Iodinated Diagnostic Agents]     Sensitivity, kidney issues    VITALS:  Blood pressure 126/68, pulse (!) 106, temperature 97.6 F (36.4 C), temperature source Oral, resp. rate (!) 22, height 5\' 3"  (1.6 m), weight 78.4 kg, SpO2 97 %.  PHYSICAL EXAMINATION:  Physical Exam  GENERAL:  85 y.o.-year-old elderly patient lying in the bed with no acute distress.  EYES: Pupils equal, round, reactive to light . No scleral icterus. Extraocular muscles intact.  HEENT: Head atraumatic, normocephalic. Oropharynx and nasopharynx clear.  NECK:  Supple, no jugular venous distention. No thyroid enlargement, no tenderness.  LUNGS: Normal breath sounds bilaterally, no wheezing, rales,rhonchi or crepitation. No use of accessory muscles of respiration.  Decreased bibasilar breath sounds Right chest permacath present CARDIOVASCULAR: S1, S2 normal. No  rubs, or gallops. 3/6 systolic murmur ABDOMEN: Soft, nontender, nondistended. Bowel sounds present. No organomegaly or mass.  EXTREMITIES: 2+ bilateral pedal edema noted.  No cyanosis, or clubbing.  NEUROLOGIC: Cranial nerves II through XII are intact.  Following some simple commands- able to move the left side as well- globally very weak- 3/5 strength. sensation intact- per son- no residual deficits form previous stroke. PSYCHIATRIC: The patient is alert but not oriented.  SKIN: No obvious rash, lesion, or  ulcer.    LABORATORY PANEL:   CBC Recent Labs  Lab 03/14/18 0508  WBC 5.6  HGB 7.6*  HCT 26.4*  PLT 91*   ------------------------------------------------------------------------------------------------------------------  Chemistries  Recent Labs  Lab 03/14/18 0508  NA 136  K 3.9  CL 98  CO2 33*  GLUCOSE 234*  BUN 73*  CREATININE 2.30*  CALCIUM 8.5*   ------------------------------------------------------------------------------------------------------------------  Cardiac Enzymes Recent Labs  Lab 03/11/18 2118  TROPONINI 0.91*   ------------------------------------------------------------------------------------------------------------------  RADIOLOGY:  No results found.  EKG:   Orders placed or performed during the hospital encounter of 02/26/18  . EKG 12-Lead  . EKG 12-Lead  . EKG 12-Lead  . EKG 12-Lead    ASSESSMENT AND PLAN:   [name] is an85 y.o.femalewith multiple comorbidities including diabetes type 2, hypertension, hyperlipidemia, coronary disease status post CABG, history of stroke, and CKD who was admitted on1/27/2020. Originally admitted to [place] [facility]withdysphagia and achalasiaand 50 pound weight loss in 12/2017.Status post treatment for ESBL Klebsiella aspiration pneumonia, enterococcus UTI. Admitted for dialysis.  1.  Acute renal failure on CKD stage III-admitted with uremia and encephalopathy -Started on hemodialysis about 3 weeks ago.  No recovering renal function noted. -Appreciate nephrology consult.  Patient will need dialysis at this time. -Working on setting up outpatient dialysis.  Patient has a right chest permacath -Receiving IV albumin during dialysis as needed  2.  Acute on chronic combined heart failure-echo with moderate mitral regurgitation and bilateral pleural effusion.  Presented with anasarca and hypoalbuminemia -Volume overload improving with dialysis. -Patient has finished her antibiotics for  aspiration pneumonia.  3.  Dysphagia secondary to  achalasia- s/p Botox injections but causing pharyngeal muscle weakness. -Currently status post PEG tube and tube feeds  4.  Diabetes mellitus-appreciate diabetes coordinator's input.  Started on NovoLog every 4 hours while on tube feeds. -Continue sliding scale  5.  Left temporal and frontal CVA-on Plavix and statin  6.  Anemia-likely anemia of chronic disease . -Next visit if hemoglobin is less than 7.  Continue EPO with dialysis.  7.  Acute encephalopathy-initially was in uremic encephalopathy with decreased mentation.  Now more alert but confused.  Underlying delirium cannot be ruled out.  Continue to monitor. - Family refusing haldol.  Physical therapy consulted   All the records are reviewed and case discussed with Care Management/Social Workerr. Management plans discussed with the patient, family and they are in agreement.  CODE STATUS: Full code  TOTAL TIME TAKING CARE OF THIS PATIENT: 37 minutes.   POSSIBLE D/C IN ?  DAYS, DEPENDING ON CLINICAL CONDITION.   Gladstone Lighter M.D on 03/15/2018 at 10:46 AM  Between 7am to 6pm - Pager - [phone]  After 6pm go to [email or web address] - password [email or web address]  Office  [phone]  CC: Primary care physician; Deland Pretty, MD

## 2018-03-15 NOTE — Progress Notes (Signed)
Spoke to son about giving patient haldol and he refused.  Per son, patient only screams out when she is wet, cold, or getting repositioned.  She has been screaming out all night.  Son just says to talk her through it.  Will continue to monitor.  Christene Slates

## 2018-03-16 LAB — GLUCOSE, CAPILLARY
Glucose-Capillary: 113 mg/dL — ABNORMAL HIGH (ref 70–99)
Glucose-Capillary: 151 mg/dL — ABNORMAL HIGH (ref 70–99)
Glucose-Capillary: 166 mg/dL — ABNORMAL HIGH (ref 70–99)
Glucose-Capillary: 42 mg/dL — CL (ref 70–99)
Glucose-Capillary: 54 mg/dL — ABNORMAL LOW (ref 70–99)
Glucose-Capillary: 73 mg/dL (ref 70–99)
Glucose-Capillary: 76 mg/dL (ref 70–99)
Glucose-Capillary: 92 mg/dL (ref 70–99)
Glucose-Capillary: 96 mg/dL (ref 70–99)

## 2018-03-16 MED ORDER — DEXTROSE 50 % IV SOLN
INTRAVENOUS | Status: AC
  Administered 2018-03-16: 50 mL
  Filled 2018-03-16: qty 50

## 2018-03-16 MED ORDER — DEXTROSE 50 % IV SOLN
INTRAVENOUS | Status: AC
  Filled 2018-03-16: qty 50

## 2018-03-16 MED ORDER — MIDODRINE HCL 5 MG PO TABS
5.0000 mg | ORAL_TABLET | Freq: Two times a day (BID) | ORAL | Status: DC
  Administered 2018-03-16 – 2018-03-28 (×24): 5 mg via ORAL
  Filled 2018-03-16 (×27): qty 1

## 2018-03-16 MED ORDER — DEXTROSE 50 % IV SOLN
1.0000 | Freq: Once | INTRAVENOUS | Status: AC
  Administered 2018-03-16: 50 mL via INTRAVENOUS

## 2018-03-16 NOTE — Progress Notes (Signed)
hHypoglycemic Event  CBG: 15 and 42  Treatment: 25 gram D50  Symptoms: awake and moaning  Follow-up CBG: Time:1650 CBG Result:73  Possible Reasons for Event: Peg feeding had been stopped per son request  Comments/MD notified:protocol followed.  Peg feeding resumed    Haynes Dage

## 2018-03-16 NOTE — Progress Notes (Signed)
[name] NAME: [name]    MR#:  [id number]  DATE OF BIRTH:  07-Aug-1927  SUBJECTIVE:  CHIEF COMPLAINT:  No chief complaint on file.  -Patient likes to keep her eyes open.  Not following commands this morning -Physical therapy with great effort was able to place her in a recliner.  Patient started screaming while in the chair -For dialysis today  REVIEW OF SYSTEMS:  Review of Systems  Unable to perform ROS: Mental status change    DRUG ALLERGIES:   Allergies  Allergen Reactions  . Reglan [Metoclopramide]     Pt's son reports this causes patient tremors and neurologic issues   . Contrast Media [Iodinated Diagnostic Agents]     Sensitivity, kidney issues    VITALS:  Blood pressure (!) 105/58, pulse 92, temperature 98 F (36.7 C), temperature source Oral, resp. rate 18, height 5\' 3"  (1.6 m), weight 78.9 kg, SpO2 (!) 64 %.  PHYSICAL EXAMINATION:  Physical Exam  GENERAL:  85 y.o.-year-old elderly patient lying in the bed with no acute distress.  EYES: Pupils equal, round, reactive to light . No scleral icterus. Extraocular muscles intact.  HEENT: Head atraumatic, normocephalic. Oropharynx and nasopharynx clear.  NECK:  Supple, no jugular venous distention. No thyroid enlargement, no tenderness.  LUNGS: Normal breath sounds bilaterally, no wheezing, rales,rhonchi or crepitation. No use of accessory muscles of respiration.  Decreased bibasilar breath sounds Right chest permacath present CARDIOVASCULAR: S1, S2 normal. No  rubs, or gallops. 3/6 systolic murmur ABDOMEN: Soft, nontender, nondistended. Bowel sounds present. No organomegaly or mass.  EXTREMITIES: 2+ bilateral pedal edema noted.  2+ bilateral hand edema noted as well.  No cyanosis, or clubbing.  NEUROLOGIC: Cranial nerves II through XII are intact.  No facial droop noted -Not following commands today.  Appears globally very weak.  Much weaker in the lower  extremities.  sensation intact- per son- no residual deficits form previous stroke. PSYCHIATRIC: The patient is alert, not following commands.  Does not seem to be oriented.  Screaming SKIN: No obvious rash, lesion.  Has a stage II pressure ulcer in the sacral area   LABORATORY PANEL:   CBC Recent Labs  Lab 03/14/18 0508  WBC 5.6  HGB 7.6*  HCT 26.4*  PLT 91*   ------------------------------------------------------------------------------------------------------------------  Chemistries  Recent Labs  Lab 03/14/18 0508  NA 136  K 3.9  CL 98  CO2 33*  GLUCOSE 234*  BUN 73*  CREATININE 2.30*  CALCIUM 8.5*   ------------------------------------------------------------------------------------------------------------------  Cardiac Enzymes Recent Labs  Lab 03/11/18 2118  TROPONINI 0.91*   ------------------------------------------------------------------------------------------------------------------  RADIOLOGY:  Dg Chest Port 1 View  Result Date: 03/15/2018 CLINICAL DATA:  Confusion EXAM: PORTABLE CHEST 1 VIEW COMPARISON:  Chest radiograph 03/08/2018 FINDINGS: Central venous catheter tip projects over the right atrium. Patient status post median sternotomy. Stable cardiomegaly. Moderate bilateral pleural effusions and underlying opacities. No pneumothorax. IMPRESSION: Cardiomegaly. Persistent moderate bilateral effusions and underlying opacities. Electronically Signed   By: Lovey Newcomer M.D.   On: 03/15/2018 12:28    EKG:   Orders placed or performed during the hospital encounter of 02/26/18  . EKG 12-Lead  . EKG 12-Lead  . EKG 12-Lead  . EKG 12-Lead    ASSESSMENT AND PLAN:   [name] is an85 y.o.femalewith multiple comorbidities including diabetes type 2, hypertension, hyperlipidemia, coronary disease status post CABG, history of stroke, and CKD who was admitted on1/27/2020. [facility] [place] [facility]  and achalasiaand 50  pound weight loss in 12/2017.Status post treatment for ESBL Klebsiella aspiration pneumonia, enterococcus UTI. Admitted for dialysis.  1.  Acute renal failure on CKD stage III- admitted with uremia and encephalopathy - on hemodialysis for about 3 weeks (by 03/19/18).  No recovering renal function noted. -Appreciate nephrology consult.  Patient will need dialysis at this time. -Working on setting up outpatient dialysis.  Patient has a right chest permacath -Receiving IV albumin during dialysis as needed -Plan is to get her home at discharge with home peritoneal dialysis. -Vascular has been consulted for possible PD catheter placement for early next week  2.  Acute on chronic combined heart failure-echo with moderate mitral regurgitation and bilateral pleural effusion.  Presented with anasarca and hypoalbuminemia -Volume overload improving with dialysis. -Patient has finished her antibiotics for aspiration pneumonia.  3.  Dysphagia secondary to achalasia- s/p Botox injections but causing pharyngeal muscle weakness. -Currently status post PEG tube and tube feeds  4.  Diabetes mellitus-appreciate diabetes coordinator's input.  on NovoLog every 4 hours while on tube feeds. -Continue sliding scale  5.  Left temporal and frontal CVA-on Plavix and statin  6.  Anemia-likely anemia of chronic disease . -transfusion if hemoglobin is less than 7.  Continue EPO with dialysis.  7.  Acute encephalopathy-initially was in uremic encephalopathy with decreased mentation.  Now more alert but confused.  Underlying delirium cannot be ruled out.  Continue to monitor. - needing phenergan PRN prior to dialysis as she is still screaming during dialysis - Family refusing haldol.  Physical therapy consulted- patient was able to be put in the recliner using the hoyer, lift today   All the records are reviewed and case discussed with Care Management/Social Workerr. Management plans discussed with the patient,  family and they are in agreement.  CODE STATUS: Full code  TOTAL TIME TAKING CARE OF THIS PATIENT: 37 minutes.   POSSIBLE D/C IN 1-2 weeks, DEPENDING ON CLINICAL CONDITION.   Gladstone Lighter M.D on 03/16/2018 at 11:38 AM  Between 7am to 6pm - Pager - [phone]  After 6pm go to [email or web address] - password [email or web address] [facility]  Office  [phone]  CC: Primary care physician; Deland Pretty, MD

## 2018-03-16 NOTE — Progress Notes (Signed)
Prime doc notified pt a-fib heart rate 90's to 130's not sustaining, ordered am labs.

## 2018-03-16 NOTE — Care Management Important Message (Signed)
Important Message  Patient Details  Name: [name] MRN: [id number] Date of Birth: May 10, 1927   Medicare Important Message Given:  No(patient off floor for HD.  to review with son when he is present )    Beverly Sessions, RN 03/16/2018, 3:37 PM

## 2018-03-16 NOTE — Progress Notes (Signed)
HD tx end    03/16/18 1531  Vital Signs  Pulse Rate 98  Pulse Rate Source Monitor  Resp (!) 25  BP (!) 91/56  BP Location Left Arm  BP Method Automatic  Patient Position (if appropriate) Lying  Oxygen Therapy  SpO2 92 %  O2 Device Nasal Cannula  O2 Flow Rate (L/min) 2 L/min  During Hemodialysis Assessment  Dialysis Fluid Bolus Normal Saline  Bolus Amount (mL) 250 mL  Intra-Hemodialysis Comments Tx completed

## 2018-03-16 NOTE — Progress Notes (Signed)
Patient was placed in chair with assist of lift per son's request.  Right after being placed in the chair the orderly arrived to take the patient to dialysis.  The goal is to have the patient sit up in the chair for HD.  Patient immediately started hollering out when placed in the chair.  Son requested that the patient be put back in the bed for HD.  Dr Juleen China is aware of all of this.  He wants the patient to go to HD in the chair on monday

## 2018-03-16 NOTE — Care Management (Signed)
Per attending and nephology patient has declined LTACH

## 2018-03-16 NOTE — Progress Notes (Signed)
HD tx start    03/16/18 1224  Vital Signs  Pulse Rate (!) 102  Pulse Rate Source Monitor  Resp 16  BP 98/62  BP Location Left Arm  BP Method Automatic  Patient Position (if appropriate) Lying  Oxygen Therapy  SpO2 96 %  O2 Device Nasal Cannula  O2 Flow Rate (L/min) 2 L/min  During Hemodialysis Assessment  Blood Flow Rate (mL/min) 400 mL/min  Arterial Pressure (mmHg) -120 mmHg  Venous Pressure (mmHg) 100 mmHg  Transmembrane Pressure (mmHg) 50 mmHg  Ultrafiltration Rate (mL/min) 330 mL/min  Dialysate Flow Rate (mL/min) 600 ml/min  Conductivity: Machine  14  HD Safety Checks Performed Yes  Dialysis Fluid Bolus Normal Saline  Bolus Amount (mL) 250 mL  Intra-Hemodialysis Comments Tx initiated  Hemodialysis Catheter Right Subclavian Double-lumen;Permanent  Placement Date: 03/05/18   Orientation: Right  Access Location: Subclavian  Hemodialysis Catheter Type: Double-lumen;Permanent  Blue Lumen Status Infusing  Red Lumen Status Infusing

## 2018-03-16 NOTE — Progress Notes (Signed)
Post HD assessment    03/16/18 1539  Neurological  Level of Consciousness Alert  Orientation Level Disoriented X4  Respiratory  Respiratory Pattern Labored  Chest Assessment Chest expansion symmetrical  Cardiac  Pulse Regular  ECG Monitor Yes  Cardiac Rhythm ST;NSR  Ectopy Unifocal PVC's  Ectopy Frequency Frequent  Vascular  R Radial Pulse +2  L Radial Pulse +2  Edema Generalized  Integumentary  Integumentary (WDL) X  Skin Color Appropriate for ethnicity  Musculoskeletal  Musculoskeletal (WDL) X  Generalized Weakness Yes  Assistive Device None  GU Assessment  Genitourinary (WDL) X  Genitourinary Symptoms  (HD)  Psychosocial  Psychosocial (WDL) X  Patient Behaviors Not interactive

## 2018-03-16 NOTE — Progress Notes (Signed)
Post HD assessment    03/16/18 1541  Vital Signs  Temp (!) 97.4 F (36.3 C)  Temp Source Axillary  Pulse Rate 79  Pulse Rate Source Monitor  Resp 14  BP 112/68  BP Location Left Arm  BP Method Automatic  Patient Position (if appropriate) Lying  Oxygen Therapy  SpO2 92 %  O2 Device Nasal Cannula  O2 Flow Rate (L/min) 2 L/min  Pain Assessment  Pain Scale PAINAD  PAINAD (Pain Assessment in Advanced Dementia)  Breathing 1  Negative Vocalization 0  Facial Expression 0  Body Language 1  Consolability 1  PAINAD Score 3  Dialysis Weight  Weight 77.8 kg  Type of Weight Post-Dialysis  Post-Hemodialysis Assessment  Rinseback Volume (mL) 250 mL  KECN 70.7 V  Dialyzer Clearance Lightly streaked  Duration of HD Treatment -hour(s) 3 hour(s)  Hemodialysis Intake (mL) 500 mL  UF Total -Machine (mL) 852 mL  Net UF (mL) 352 mL  Tolerated HD Treatment Yes  Education / Care Plan  Dialysis Education Provided Yes  Documented Education in Care Plan Yes  Hemodialysis Catheter Right Subclavian Double-lumen;Permanent  Placement Date: 03/05/18   Orientation: Right  Access Location: Subclavian  Hemodialysis Catheter Type: Double-lumen;Permanent  Site Condition No complications  Blue Lumen Status Heparin locked  Red Lumen Status Heparin locked  Purple Lumen Status N/A  Catheter fill solution Heparin 1000 units/ml  Catheter fill volume (Arterial) 1.5 cc  Catheter fill volume (Venous) 1.5  Dressing Type Biopatch  Dressing Status Clean;Dry;Intact  Drainage Description None  Post treatment catheter status Capped and Clamped

## 2018-03-16 NOTE — Progress Notes (Signed)
[facility]  ROUNDING NOTE   Subjective:   Family meeting with son, Dr. Mayer Masker, this morning. Attended by Dr. Tressia Miners and Dr. Martinique. Son wants patient to do peritoneal dialysis. Discussed case with Dr. Lucky Cowboy over the phone, who is agreeing to this. All risks of procedure discussed with son.   Patient was successfully transferred to chair. However unable to tolerate long enough for dialysis treatment.   Taken down for dialysis and treatment in bed. Promethazine given and patient was partially sedated.     HEMODIALYSIS FLOWSHEET:  Blood Flow Rate (mL/min): 400 mL/min Arterial Pressure (mmHg): -70 mmHg Venous Pressure (mmHg): 50 mmHg Transmembrane Pressure (mmHg): 80 mmHg Ultrafiltration Rate (mL/min): 340 mL/min Dialysate Flow Rate (mL/min): 600 ml/min Conductivity: Machine : 14.1 Conductivity: Machine : 14.1 Dialysis Fluid Bolus: Normal Saline Bolus Amount (mL): 250 mL Dialysate Change: (Na 140)    Objective:  Vital signs in last 24 hours:  Temp:  [97.3 F (36.3 C)-98 F (36.7 C)] 97.3 F (36.3 C) (02/14 1212) Pulse Rate:  [84-103] 95 (02/14 1400) Resp:  [13-18] 16 (02/14 1400) BP: (80-118)/(46-103) 90/62 (02/14 1400) SpO2:  [64 %-98 %] 96 % (02/14 1400) Weight:  [78.3 kg-78.9 kg] 78.3 kg (02/14 1212)  Weight change: 1 kg Filed Weights   03/15/18 0500 03/16/18 0547 03/16/18 1212  Weight: 78.4 kg 78.9 kg 78.3 kg    Intake/Output: I/O last 3 completed shifts: In: 1175 [NG/GT:1175] Out: 0    Intake/Output this shift:  No intake/output data recorded.  Physical Exam: General: NAD, Laying in bed  Head: Normocephalic, atraumatic. Moist oral mucosal membranes  Eyes: Anicteric, PERRL  Neck: Supple, trachea midline  Lungs:  Clear to auscultation  Heart: Regular rate and rhythm  Abdomen:  Soft, nontender, +G-tube  Extremities: no peripheral edema.  Neurologic: Alert to self, lethargic.   Skin: No lesions  Access: RIJ permcath    Basic Metabolic  Panel: Recent Labs  Lab 03/10/18 2000  03/11/18 0646 03/12/18 0440 03/13/18 0504 03/14/18 0508  NA  --   --  138 137 138 136  K  --   --  3.8 3.7 3.7 3.9  CL  --   --  98 98 100 98  CO2  --   --  31 32 34* 33*  GLUCOSE  --   --  197* 87 201* 234*  BUN  --   --  53* 76* 50* 73*  CREATININE  --   --  1.85* 2.23* 1.98* 2.30*  CALCIUM  --    < > 8.7* 8.8* 8.5* 8.5*  PHOS 1.7*  --   --   --   --   --    < > = values in this interval not displayed.    Liver Function Tests: No results for input(s): AST, ALT, ALKPHOS, BILITOT, PROT, ALBUMIN in the last 168 hours. No results for input(s): LIPASE, AMYLASE in the last 168 hours. No results for input(s): AMMONIA in the last 168 hours.  CBC: Recent Labs  Lab 03/14/18 0508  WBC 5.6  HGB 7.6*  HCT 26.4*  MCV 105.6*  PLT 91*    Cardiac Enzymes: Recent Labs  Lab 03/11/18 1030 03/11/18 1629 03/11/18 2118  TROPONINI 1.00* 0.90* 0.91*    BNP: Invalid input(s): POCBNP  CBG: Recent Labs  Lab 03/15/18 1937 03/16/18 0034 03/16/18 0412 03/16/18 0744 03/16/18 1140  GLUCAP 191* 166* 113* 96 76    Microbiology: Results for orders placed or performed during the hospital encounter of  02/26/18  MRSA PCR Screening     Status: Abnormal   Collection Time: 02/26/18  5:39 PM  Result Value Ref Range Status   MRSA by PCR POSITIVE (A) NEGATIVE Final    Comment:        The GeneXpert MRSA Assay (FDA approved for NASAL specimens only), is one component of a comprehensive MRSA colonization surveillance program. It is not intended to diagnose MRSA infection nor to guide or monitor treatment for MRSA infections. CRITICAL RESULT CALLED TO, READ BACK BY AND VERIFIED WITH:  CALLED TO DANA DUGGINS RN @1920  Performed at [facility], [address] [place], [place] [address]   CULTURE, BLOOD (ROUTINE X 2) w Reflex to ID Panel     Status: None   Collection Time: 03/06/18  9:01 PM  Result Value Ref Range Status   Specimen  Description BLOOD LEFT ANTECUBITAL  Final   Special Requests   Final    BOTTLES DRAWN AEROBIC ONLY Blood Culture adequate volume   Culture   Final    NO GROWTH 5 DAYS Performed at [facility], [address] [place], [place] [address]    Report Status 03/11/2018 FINAL  Final  CULTURE, BLOOD (ROUTINE X 2) w Reflex to ID Panel     Status: None   Collection Time: 03/06/18  9:37 PM  Result Value Ref Range Status   Specimen Description BLOOD LEFT ANTECUBITAL  Final   Special Requests   Final    BOTTLES DRAWN AEROBIC AND ANAEROBIC Blood Culture adequate volume   Culture   Final    NO GROWTH 5 DAYS Performed at [facility], [address] [place], [place] [address]    Report Status 03/11/2018 FINAL  Final    Coagulation Studies: No results for input(s): LABPROT, INR in the last 72 hours.  Urinalysis: No results for input(s): COLORURINE, LABSPEC, PHURINE, GLUCOSEU, HGBUR, BILIRUBINUR, KETONESUR, PROTEINUR, UROBILINOGEN, NITRITE, LEUKOCYTESUR in the last 72 hours.  Invalid input(s): APPERANCEUR    Imaging: Dg Chest Port 1 View  Result Date: 03/15/2018 CLINICAL DATA:  Confusion EXAM: PORTABLE CHEST 1 VIEW COMPARISON:  Chest radiograph 03/08/2018 FINDINGS: Central venous catheter tip projects over the right atrium. Patient status post median sternotomy. Stable cardiomegaly. Moderate bilateral pleural effusions and underlying opacities. No pneumothorax. IMPRESSION: Cardiomegaly. Persistent moderate bilateral effusions and underlying opacities. Electronically Signed   By: Lovey Newcomer M.D.   On: 03/15/2018 12:28     Medications:   . albumin human 25 g (03/10/18 1946)  . feeding supplement (OSMOLITE 1.5 CAL) Stopped (03/16/18 6195)   . atorvastatin  40 mg Per Tube q1800  . B-complex with vitamin C  1 tablet Per Tube Daily  . chlorhexidine  15 mL Mouth Rinse BID  . clopidogrel  75 mg Per Tube QHS  . clotrimazole  1 Applicatorful Vaginal QHS  . collagenase    Topical Daily  . epoetin (EPOGEN/PROCRIT) injection  10,000 Units Intravenous Q M,W,F-HD  . famotidine  10 mg Per Tube Daily  . feeding supplement (PRO-STAT SUGAR FREE 64)  30 mL Per Tube BID  . free water  20 mL Per Tube Q4H  . insulin aspart  0-9 Units Subcutaneous Q4H  . insulin aspart  4 Units Subcutaneous Q4H  . mouth rinse  15 mL Mouth Rinse q12n4p  . multivitamin  15 mL Per Tube QHS  . nutrition supplement (JUVEN)  1 packet Per Tube BID BM  . pantoprazole (PROTONIX) IV  40 mg Intravenous Daily  .  polyethylene glycol  17 g Oral Daily   acetaminophen (TYLENOL) oral liquid 160 mg/5 mL **OR** acetaminophen **OR** acetaminophen, albumin human, albuterol, ipratropium, lip balm, Melatonin, nitroGLYCERIN, ondansetron, promethazine  Assessment/ Plan:  [name] is a 85 y.o. black female with diabetes mellitus type II, hypertension, hyperlipidemia, coronary disease status post CABG, history of stroke, CKD, was admitted on1/27/2020. [facility] [place] [facility] [place]and 50 pound weight loss, dysarthria since November 2019.   1. Acute kidney injury on chronic kidney disease stage III. Progression to End Stage Renal Disease.   No signs of renal recovery.  Will Continue MWF schedule Seen and examined on hemodialysis treatment. Tolerating treatment so far.  Family is desiring peritoneal dialysis. Will attempt peritoneal dialysis catheter placement next week by Dr. Lucky Cowboy. Risks of such a procedure discussed with son who states he understands.   Will attempt hemodialysis in chair again on Monday.  Monitor daily for dialysis need  2. Hypotension: blood pressure low.  - start midodrine 5mg  bid Per tube  3. Anemia of chronic kidney disease:  Macrocytic.  - EPO with HD treatment  4. Malnutrition with achalasia: G tube placed. Tolerating tube feeds.  Albumin 1.8. New albumin pending.  - IV albumin with HD treatment    LOS: 18 Maliek Schellhorn 2/14/20202:19 PM

## 2018-03-16 NOTE — Progress Notes (Signed)
Pre HD assessment  03/16/18 1212  Vital Signs  Temp (!) 97.3 F (36.3 C)  Temp Source Oral  Pulse Rate (!) 103  Pulse Rate Source Monitor  Resp 18  BP (!) 118/103  BP Location Left Arm  BP Method Automatic  Patient Position (if appropriate) Lying  Oxygen Therapy  SpO2 96 %  O2 Device Nasal Cannula  O2 Flow Rate (L/min) 2 L/min  Pain Assessment  Pain Scale 0-10  Pain Score Asleep  PAINAD (Pain Assessment in Advanced Dementia)  Breathing 0  Negative Vocalization 0  Facial Expression 0  Body Language 1  Consolability 0  PAINAD Score 1  Dialysis Weight  Weight 78.3 kg  Type of Weight Pre-Dialysis  Time-Out for Hemodialysis  What Procedure? HD  Pt Identifiers(min of two) First/Last Name;MRN/Account#  Correct Site? Yes  Correct Side? Yes  Correct Procedure? Yes  Consents Verified? Yes  Rad Studies Available? N/A  Safety Precautions Reviewed? Yes  Research scientist (physical sciences)  (2A)  Station Number 4  UF/Alarm Test Passed  Conductivity: Meter 13.6  Conductivity: Machine  13.9  pH 7.2  Reverse Osmosis main  Normal Saline Lot Number [id number]  Dialyzer Lot Number [id number]  Disposable Set Lot Number [id number]  Machine Temperature 98.6 F (37 C)  Musician and Audible Yes  Blood Lines Intact and Secured Yes  Pre Treatment Patient Checks  Vascular access used during treatment Catheter  Hepatitis B Surface Antigen Results Negative  Date Hepatitis B Surface Antigen Drawn 02/26/18  Hepatitis B Surface Antibody  (<10)  Date Hepatitis B Surface Antibody Drawn 02/26/18  Hemodialysis Consent Verified Yes  Hemodialysis Standing Orders Initiated Yes  ECG (Telemetry) Monitor On Yes  Prime Ordered Normal Saline  Length of  DialysisTreatment -hour(s) 3 Hour(s)  Dialyzer Elisio 17H NR  Dialysate 3K, 2.5 Ca  Dialysis Anticoagulant None  Dialysate Flow Ordered 600  Blood Flow Rate Ordered 400 mL/min  Ultrafiltration Goal 0.5 Liters  Pre Treatment Labs Renal panel;CBC   Dialysis Blood Pressure Support Ordered Normal Saline  Education / Care Plan  Dialysis Education Provided Yes  Documented Education in Care Plan Yes  Hemodialysis Catheter Right Subclavian Double-lumen;Permanent  Placement Date: 03/05/18   Orientation: Right  Access Location: Subclavian  Hemodialysis Catheter Type: Double-lumen;Permanent  Site Condition No complications  Dressing Type Biopatch  Dressing Status Clean;Dry;Intact  Drainage Description None

## 2018-03-16 NOTE — Progress Notes (Signed)
Physical Therapy Treatment Patient Details Name: [name] MRN: [id number] DOB: 03/14/1927 Today's Date: 03/16/2018    History of Present Illness patient admitted to facility as transfer from [facility] for trial of dialysis and ongoing care.  Patient initially presented to [facility] 11/5 due to UTI, AMS, achalasia (causing significant weight loss); extended hospital course complicated by multiple medical comorbidities/situations including g-tube placement (11/10), respiratory failure requiring intubation (12/2-12/6), PNA, acute metabolic encephalopathy, AKI and multiple cerebral infarcts.  Since admission to this facility (1/27), patient with R temp fem cath (placed 1/27, now removed), converted to R IJ perm-cath; also noted with RR event resulting in transfer to CCU for hypotension (requiring pressors).  Currently on med-surg floor with g-tube, supplemental O2 (4L) and R UE PICC.,    PT Comments    Patient seen this date for re-attempt at OOB to chair transfer.  Bed/chair transfer with viking/hoyer lift, dep assist +2, for sling placement, movement/transfer and positioning in recliner.  Unable to actively assist/participate with transfer in any capacity.  Patient vitals stable and WFL throughout transfer on 2L supplemental O2, but non-responsive to external stimuli throughout duration of transfer.  Does open eyes and attempt interaction/communication once up in chair-moaning/yelling (suggestive of pain/discomfort).  Unable to meaningfully communicate or answer questions.  Apparent pain unrelieved until return to bed prior to end of session for patient comfort and upcoming transport to dialysis.  Tolerating OOB in chair only 5-10 minutes at this time. Attending physician, son present in room throughout session for observation of session, patient tolerance and overall outcomes/medical safety.      Follow Up Recommendations  (per family, home with home health services)     Equipment Recommendations   Wheelchair (measurements PT);Hospital bed(hoyer lift)    Recommendations for Other Services       Precautions / Restrictions Precautions Precautions: Fall Precaution Comments: NPO Restrictions Weight Bearing Restrictions: No    Mobility  Bed Mobility Overal bed mobility: Needs Assistance Bed Mobility: Rolling;Supine to Sit;Sit to Supine Rolling: Total assist;+2 for physical assistance   Supine to sit: Total assist;+2 for physical assistance Sit to supine: Total assist;+2 for physical assistance   General bed mobility comments: unable to actively assist or participate with transfer  Transfers Overall transfer level: Needs assistance               General transfer comment: Bed/chair, total assist +2, with viking hoyer lift  Ambulation/Gait             General Gait Details: unsafe/unable   Stairs             Wheelchair Mobility    Modified Rankin (Stroke Patients Only)       Balance                                            Cognition Arousal/Alertness: Lethargic Behavior During Therapy: Flat affect Overall Cognitive Status: Difficult to assess                                 General Comments: Minimally opens eyes this date despite bed mobility and transfer during session      Exercises Other Exercises Other Exercises: Rolling bilat, dep assist +2 (x3 bilat), for skin inspection and management of incontinent bowel.  Unable to actively assist/participate with treatment session. Other  Exercises: Bed/chair transfer with viking/hoyer lift, dep assist +2, for sling placement, movement/transfer and positioning in recliner.  Patient vitals stable and WFL throughout transfer on 2L supplemental O2, but non-responsive to external stimuli throughout duration of transfer.  Does open eyes and attempt interaction/communication once up in chair-moaning/yelling (suggestive of pain/discomfort).  Unrelieved until return to bed  prior to end of session    General Comments        Pertinent Vitals/Pain Pain Assessment: Faces Faces Pain Scale: Hurts whole lot Pain Location: buttocks? Pain Descriptors / Indicators: Moaning Pain Intervention(s): Limited activity within patient's tolerance;Monitored during session;Repositioned    Home Living                      Prior Function            PT Goals (current goals can now be found in the care plan section) Acute Rehab PT Goals Patient Stated Goal: none stated PT Goal Formulation: Patient unable to participate in goal setting Time For Goal Achievement: 03/29/18 Potential to Achieve Goals: Fair Progress towards PT goals: Not progressing toward goals - comment    Frequency    Min 2X/week      PT Plan Current plan remains appropriate    Co-evaluation              AM-PAC PT "6 Clicks" Mobility   Outcome Measure  Help needed turning from your back to your side while in a flat bed without using bedrails?: Total Help needed moving from lying on your back to sitting on the side of a flat bed without using bedrails?: Total Help needed moving to and from a bed to a chair (including a wheelchair)?: Total Help needed standing up from a chair using your arms (e.g., wheelchair or bedside chair)?: Total Help needed to walk in hospital room?: Total Help needed climbing 3-5 steps with a railing? : Total 6 Click Score: 6    End of Session   Activity Tolerance: Patient limited by pain;Patient limited by lethargy;Treatment limited secondary to medical complications (Comment) Patient left: in bed;with nursing/sitter in room(transport present for transport to dialysis) Nurse Communication: Mobility status(recommend use of hoyer lift and +2 for any/all future attempts at OOB mobility) PT Visit Diagnosis: Muscle weakness (generalized) (M62.81);Pain;Adult, failure to thrive (R62.7)     Time: 4920-1007 PT Time Calculation (min) (ACUTE ONLY): 47  min  Charges:  $Therapeutic Activity: 38-52 mins                     Chukwudi Ewen H. Owens Shark, PT, DPT, NCS 03/16/18, 9:15 PM [phone]

## 2018-03-16 NOTE — Progress Notes (Signed)
Pre HD assessment    03/16/18 1213  Neurological  Level of Consciousness Responds to Pain  Orientation Level Disoriented X4  Respiratory  Respiratory Pattern Labored  Chest Assessment Chest expansion symmetrical  Cardiac  Pulse Regular  ECG Monitor Yes  Cardiac Rhythm ST;NSR  Vascular  R Radial Pulse +2  L Radial Pulse +2  Edema Generalized;Right upper extremity;Left upper extremity;Right lower extremity;Left lower extremity  Integumentary  Integumentary (WDL) X  Skin Color Appropriate for ethnicity  Musculoskeletal  Musculoskeletal (WDL) X  Generalized Weakness Yes  Assistive Device None  GU Assessment  Genitourinary (WDL) X  Genitourinary Symptoms  (HD)  Psychosocial  Psychosocial (WDL) X  Patient Behaviors Not interactive

## 2018-03-16 NOTE — Care Management (Signed)
MD agreed for [facility] screen. Per Raquel Sarna with [facility] they can offer bed on patient pending insurance approval.   [facility] is unable to offer

## 2018-03-16 NOTE — Progress Notes (Signed)
Post HD assessment. Pt tolerated tx well without c/o or complication. Net UF 352, goal not met r/t hypotension, MD aware.    03/16/18 1541  Vital Signs  Temp (!) 97.4 F (36.3 C)  Temp Source Axillary  Pulse Rate 79  Pulse Rate Source Monitor  Resp 14  BP 112/68  BP Location Left Arm  BP Method Automatic  Patient Position (if appropriate) Lying  Oxygen Therapy  SpO2 92 %  O2 Device Nasal Cannula  O2 Flow Rate (L/min) 2 L/min  Pain Assessment  Pain Scale PAINAD  PAINAD (Pain Assessment in Advanced Dementia)  Breathing 1  Negative Vocalization 0  Facial Expression 0  Body Language 1  Consolability 1  PAINAD Score 3  Dialysis Weight  Weight 77.8 kg  Type of Weight Post-Dialysis  Post-Hemodialysis Assessment  Rinseback Volume (mL) 250 mL  KECN 70.7 V  Dialyzer Clearance Lightly streaked  Duration of HD Treatment -hour(s) 3 hour(s)  Hemodialysis Intake (mL) 500 mL  UF Total -Machine (mL) 852 mL  Net UF (mL) 352 mL  Tolerated HD Treatment Yes  Education / Care Plan  Dialysis Education Provided Yes  Documented Education in Care Plan Yes  Hemodialysis Catheter Right Subclavian Double-lumen;Permanent  Placement Date: 03/05/18   Orientation: Right  Access Location: Subclavian  Hemodialysis Catheter Type: Double-lumen;Permanent  Site Condition No complications  Blue Lumen Status Heparin locked  Red Lumen Status Heparin locked  Purple Lumen Status N/A  Catheter fill solution Heparin 1000 units/ml  Catheter fill volume (Arterial) 1.5 cc  Catheter fill volume (Venous) 1.5  Dressing Type Biopatch  Dressing Status Clean;Dry;Intact  Drainage Description None  Post treatment catheter status Capped and Clamped

## 2018-03-17 LAB — CBC WITH DIFFERENTIAL/PLATELET
Abs Immature Granulocytes: 0.06 10*3/uL (ref 0.00–0.07)
Basophils Absolute: 0 10*3/uL (ref 0.0–0.1)
Basophils Relative: 0 %
Eosinophils Absolute: 0 10*3/uL (ref 0.0–0.5)
Eosinophils Relative: 0 %
HCT: 27.6 % — ABNORMAL LOW (ref 36.0–46.0)
Hemoglobin: 7.7 g/dL — ABNORMAL LOW (ref 12.0–15.0)
IMMATURE GRANULOCYTES: 1 %
Lymphocytes Relative: 8 %
Lymphs Abs: 0.6 10*3/uL — ABNORMAL LOW (ref 0.7–4.0)
MCH: 30.4 pg (ref 26.0–34.0)
MCHC: 27.9 g/dL — ABNORMAL LOW (ref 30.0–36.0)
MCV: 109.1 fL — ABNORMAL HIGH (ref 80.0–100.0)
Monocytes Absolute: 0.8 10*3/uL (ref 0.1–1.0)
Monocytes Relative: 11 %
Neutro Abs: 5.8 10*3/uL (ref 1.7–7.7)
Neutrophils Relative %: 80 %
Platelets: 101 10*3/uL — ABNORMAL LOW (ref 150–400)
RBC: 2.53 MIL/uL — AB (ref 3.87–5.11)
RDW: 20.3 % — ABNORMAL HIGH (ref 11.5–15.5)
WBC: 7.2 10*3/uL (ref 4.0–10.5)
nRBC: 0.6 % — ABNORMAL HIGH (ref 0.0–0.2)

## 2018-03-17 LAB — COMPREHENSIVE METABOLIC PANEL
ALT: 48 U/L — ABNORMAL HIGH (ref 0–44)
AST: 87 U/L — ABNORMAL HIGH (ref 15–41)
Albumin: 2.1 g/dL — ABNORMAL LOW (ref 3.5–5.0)
Alkaline Phosphatase: 168 U/L — ABNORMAL HIGH (ref 38–126)
Anion gap: 10 (ref 5–15)
BUN: 65 mg/dL — AB (ref 8–23)
CO2: 27 mmol/L (ref 22–32)
Calcium: 8.1 mg/dL — ABNORMAL LOW (ref 8.9–10.3)
Chloride: 99 mmol/L (ref 98–111)
Creatinine, Ser: 1.76 mg/dL — ABNORMAL HIGH (ref 0.44–1.00)
GFR calc Af Amer: 29 mL/min — ABNORMAL LOW (ref >60)
GFR calc non Af Amer: 25 mL/min — ABNORMAL LOW (ref >60)
Glucose, Bld: 224 mg/dL — ABNORMAL HIGH (ref 70–99)
Potassium: 4.4 mmol/L (ref 3.5–5.1)
SODIUM: 136 mmol/L (ref 135–145)
Total Bilirubin: 1.3 mg/dL — ABNORMAL HIGH (ref 0.3–1.2)
Total Protein: 5.8 g/dL — ABNORMAL LOW (ref 6.5–8.1)

## 2018-03-17 LAB — GLUCOSE, CAPILLARY
Glucose-Capillary: 192 mg/dL — ABNORMAL HIGH (ref 70–99)
Glucose-Capillary: 217 mg/dL — ABNORMAL HIGH (ref 70–99)
Glucose-Capillary: 220 mg/dL — ABNORMAL HIGH (ref 70–99)
Glucose-Capillary: 220 mg/dL — ABNORMAL HIGH (ref 70–99)
Glucose-Capillary: 237 mg/dL — ABNORMAL HIGH (ref 70–99)
Glucose-Capillary: 242 mg/dL — ABNORMAL HIGH (ref 70–99)

## 2018-03-17 LAB — MAGNESIUM: Magnesium: 2.1 mg/dL (ref 1.7–2.4)

## 2018-03-17 LAB — PHOSPHORUS: Phosphorus: 2 mg/dL — ABNORMAL LOW (ref 2.5–4.6)

## 2018-03-17 NOTE — Progress Notes (Signed)
[place] [facility] [place] NAME: [name]    MR#:  [id number]  DATE OF BIRTH:  February 26, 1927  SUBJECTIVE:  CHIEF COMPLAINT:  No chief complaint on file.  Drowsy.  Not talking.  Patient pulled her PICC line out overnight. Has peripheral IV  REVIEW OF SYSTEMS:  Review of Systems  Unable to perform ROS: Mental status change   DRUG ALLERGIES:   Allergies  Allergen Reactions  . Reglan [Metoclopramide]     Pt's son reports this causes patient tremors and neurologic issues   . Contrast Media [Iodinated Diagnostic Agents]     Sensitivity, kidney issues    VITALS:  Blood pressure 91/60, pulse 99, temperature (!) 97.5 F (36.4 C), temperature source Oral, resp. rate 20, height 5\' 3"  (1.6 m), weight 77.4 kg, SpO2 100 %.  PHYSICAL EXAMINATION:  Physical Exam  GENERAL:  85 y.o.-year-old elderly patient lying in the bed with no acute distress.  EYES: Pupils equal, round, reactive to light . No scleral icterus. Extraocular muscles intact.  HEENT: Head atraumatic, normocephalic. Oropharynx and nasopharynx clear.  NECK:  Supple, no jugular venous distention. No thyroid enlargement, no tenderness.  LUNGS: Bilateral rhonchi Right chest permacath present CARDIOVASCULAR: S1, S2 normal. No  rubs, or gallops. 3/6 systolic murmur ABDOMEN: Soft, nontender, nondistended. Bowel sounds present. No organomegaly or mass.  EXTREMITIES: 2+ bilateral pedal edema noted.  2+ bilateral hand edema noted as well.  No cyanosis, or clubbing.  NEUROLOGIC: Not following commands today.  Appears globally very weak.  Not following instructions PSYCHIATRIC: The patient is alert, not following commands.  Does not seem to be oriented.  SKIN: No obvious rash, lesion.  Has a stage II pressure ulcer in the sacral area   LABORATORY PANEL:   CBC Recent Labs  Lab 03/17/18 0555  WBC 7.2  HGB 7.7*  HCT 27.6*  PLT 101*    ------------------------------------------------------------------------------------------------------------------  Chemistries  Recent Labs  Lab 03/17/18 0555  NA 136  K 4.4  CL 99  CO2 27  GLUCOSE 224*  BUN 65*  CREATININE 1.76*  CALCIUM 8.1*  MG 2.1  AST 87*  ALT 48*  ALKPHOS 168*  BILITOT 1.3*   ------------------------------------------------------------------------------------------------------------------  Cardiac Enzymes Recent Labs  Lab 03/11/18 2118  TROPONINI 0.91*   ------------------------------------------------------------------------------------------------------------------  RADIOLOGY:  Dg Chest Port 1 View  Result Date: 03/15/2018 CLINICAL DATA:  Confusion EXAM: PORTABLE CHEST 1 VIEW COMPARISON:  Chest radiograph 03/08/2018 FINDINGS: Central venous catheter tip projects over the right atrium. Patient status post median sternotomy. Stable cardiomegaly. Moderate bilateral pleural effusions and underlying opacities. No pneumothorax. IMPRESSION: Cardiomegaly. Persistent moderate bilateral effusions and underlying opacities. Electronically Signed   By: Lovey Newcomer M.D.   On: 03/15/2018 12:28    EKG:   Orders placed or performed during the hospital encounter of 02/26/18  . EKG 12-Lead  . EKG 12-Lead  . EKG 12-Lead  . EKG 12-Lead    ASSESSMENT AND PLAN:   [name] is an26 y.o.femalewith multiple comorbidities including diabetes type 2, hypertension, hyperlipidemia, coronary disease status post CABG, history of stroke, and CKD who was admitted on1/27/2020. [facility] [place] [facility] and achalasiaand 50 pound weight loss in 12/2017.Status post treatment for ESBL Klebsiella aspiration pneumonia, enterococcus UTI. Admitted for dialysis.  1.  Acute renal failure on CKD stage III- admitted with uremia and encephalopathy Progressed to end-stage renal disease - on hemodialysis. -Working on setting up outpatient  dialysis.  Patient has a right  chest permacath -Receiving IV albumin during dialysis as needed -Plan is to get her home at discharge with home peritoneal dialysis. -Vascular has been consulted for possible PD catheter placement for early next week  2.  Acute on chronic combined heart failure-echo with moderate mitral regurgitation and bilateral pleural effusion.  Presented with anasarca and hypoalbuminemia -Volume overload improving with dialysis. -Patient has finished her antibiotics for aspiration pneumonia. -Continue oxygen  3.  Dysphagia secondary to achalasia- s/p Botox injections but causing pharyngeal muscle weakness. -Currently status post PEG tube and tube feeds  4.  Diabetes mellitus-appreciate diabetes coordinator's input. On SSI  5.  Left temporal and frontal CVA-on Plavix and statin  6.  Anemia-likely anemia of chronic disease . -transfusion if hemoglobin is less than 7.  Continue EPO with dialysis.  7.  Acute encephalopathy-initially was in uremic encephalopathy with decreased mentation.  Now  confused.  Underlying delirium .  Continue to monitor. - needing phenergan PRN prior to dialysis as she is still screaming during dialysis - Family refusing haldol.  Physical therapy consulted- patient was able to be put in the recliner using the hoyer, lift but only few minutes  Called son [name] on both numbers available but unable to leave voicemail   All the records are reviewed and case discussed with Care Management/Social Worker Management plans discussed with the patient, family and they are in agreement.  CODE STATUS: Full code  TOTAL TIME TAKING CARE OF THIS PATIENT: 37 minutes.    [name] [name] M.D on 03/17/2018 at 9:41 AM  Between 7am to 6pm - Pager - [phone]  After 6pm go to [email or web address] - password [email or web address] [facility]  Office  [phone]  CC: Primary care physician; Deland Pretty, MD

## 2018-03-17 NOTE — Progress Notes (Signed)
Prime doc notified [facility], ordered no coverage be given due to glucose drop earlier

## 2018-03-17 NOTE — Progress Notes (Signed)
Dr Posey Pronto notified Picc line pulled out 10cm, orders to d/c attempt to insert peripheral line, discuss with md in am need for picc.

## 2018-03-17 NOTE — Progress Notes (Signed)
[facility]  ROUNDING NOTE   Subjective:   Hemodialysis treatment yesterday. Unable to tolerate ultrafiltration due to hypotension. UF of  335mL.   Objective:  Vital signs in last 24 hours:  Temp:  [97.4 F (36.3 C)-98.4 F (36.9 C)] 97.5 F (36.4 C) (02/15 0420) Pulse Rate:  [77-102] 99 (02/15 0420) Resp:  [12-25] 20 (02/15 0420) BP: (88-113)/(55-69) 91/60 (02/15 0420) SpO2:  [92 %-100 %] 100 % (02/15 0420) Weight:  [77.4 kg-77.8 kg] 77.4 kg (02/15 0420)  Weight change: -0.6 kg Filed Weights   03/16/18 1212 03/16/18 1541 03/17/18 0420  Weight: 78.3 kg 77.8 kg 77.4 kg    Intake/Output: I/O last 3 completed shifts: In: 0  Out: 352 [Other:352]   Intake/Output this shift:  No intake/output data recorded.  Physical Exam: General: NAD, Laying in bed  Head: Normocephalic, atraumatic. Moist oral mucosal membranes  Eyes: Anicteric, PERRL  Neck: Supple, trachea midline  Lungs:  Clear to auscultation  Heart: Regular rate and rhythm  Abdomen:  Soft, nontender, +G-tube  Extremities: no peripheral edema.  Neurologic: Alert to self, lethargic.   Skin: No lesions  Access: RIJ permcath    Basic Metabolic Panel: Recent Labs  Lab 03/10/18 2000  03/11/18 0646 03/12/18 0440 03/13/18 0504 03/14/18 0508 03/17/18 0555  NA  --   --  138 137 138 136 136  K  --   --  3.8 3.7 3.7 3.9 4.4  CL  --   --  98 98 100 98 99  CO2  --   --  31 32 34* 33* 27  GLUCOSE  --   --  197* 87 201* 234* 224*  BUN  --   --  53* 76* 50* 73* 65*  CREATININE  --   --  1.85* 2.23* 1.98* 2.30* 1.76*  CALCIUM  --    < > 8.7* 8.8* 8.5* 8.5* 8.1*  MG  --   --   --   --   --   --  2.1  PHOS 1.7*  --   --   --   --   --  2.0*   < > = values in this interval not displayed.    Liver Function Tests: Recent Labs  Lab 03/17/18 0555  AST 87*  ALT 48*  ALKPHOS 168*  BILITOT 1.3*  PROT 5.8*  ALBUMIN 2.1*   No results for input(s): LIPASE, AMYLASE in the last 168 hours. No results for  input(s): AMMONIA in the last 168 hours.  CBC: Recent Labs  Lab 03/14/18 0508 03/17/18 0555  WBC 5.6 7.2  NEUTROABS  --  5.8  HGB 7.6* 7.7*  HCT 26.4* 27.6*  MCV 105.6* 109.1*  PLT 91* 101*    Cardiac Enzymes: Recent Labs  Lab 03/11/18 1030 03/11/18 1629 03/11/18 2118  TROPONINI 1.00* 0.90* 0.91*    BNP: Invalid input(s): POCBNP  CBG: Recent Labs  Lab 03/16/18 2131 03/16/18 2352 03/17/18 0412 03/17/18 0815 03/17/18 1143  GLUCAP 92 151* 192* 220* 242*    Microbiology: Results for orders placed or performed during the hospital encounter of 02/26/18  MRSA PCR Screening     Status: Abnormal   Collection Time: 02/26/18  5:39 PM  Result Value Ref Range Status   MRSA by PCR POSITIVE (A) NEGATIVE Final    Comment:        The GeneXpert MRSA Assay (FDA approved for NASAL specimens only), is one component of a comprehensive MRSA colonization surveillance program. It is not  intended to diagnose MRSA infection nor to guide or monitor treatment for MRSA infections. CRITICAL RESULT CALLED TO, READ BACK BY AND VERIFIED WITH:  CALLED TO DANA DUGGINS RN @1920  Performed at [facility], [address] [place], [place] [address]   CULTURE, BLOOD (ROUTINE X 2) w Reflex to ID Panel     Status: None   Collection Time: 03/06/18  9:01 PM  Result Value Ref Range Status   Specimen Description BLOOD LEFT ANTECUBITAL  Final   Special Requests   Final    BOTTLES DRAWN AEROBIC ONLY Blood Culture adequate volume   Culture   Final    NO GROWTH 5 DAYS Performed at [facility], [address] [place], [place] [address]    Report Status 03/11/2018 FINAL  Final  CULTURE, BLOOD (ROUTINE X 2) w Reflex to ID Panel     Status: None   Collection Time: 03/06/18  9:37 PM  Result Value Ref Range Status   Specimen Description BLOOD LEFT ANTECUBITAL  Final   Special Requests   Final    BOTTLES DRAWN AEROBIC AND ANAEROBIC Blood Culture adequate volume   Culture    Final    NO GROWTH 5 DAYS Performed at [facility], [address], [place], [place] [address]    Report Status 03/11/2018 FINAL  Final    Coagulation Studies: No results for input(s): LABPROT, INR in the last 72 hours.  Urinalysis: No results for input(s): COLORURINE, LABSPEC, PHURINE, GLUCOSEU, HGBUR, BILIRUBINUR, KETONESUR, PROTEINUR, UROBILINOGEN, NITRITE, LEUKOCYTESUR in the last 72 hours.  Invalid input(s): APPERANCEUR    Imaging: No results found.   Medications:   . albumin human 25 g (03/10/18 1946)  . feeding supplement (OSMOLITE 1.5 CAL) 1,000 mL (03/16/18 1652)   . atorvastatin  40 mg Per Tube q1800  . B-complex with vitamin C  1 tablet Per Tube Daily  . chlorhexidine  15 mL Mouth Rinse BID  . clopidogrel  75 mg Per Tube QHS  . clotrimazole  1 Applicatorful Vaginal QHS  . collagenase   Topical Daily  . epoetin (EPOGEN/PROCRIT) injection  10,000 Units Intravenous Q M,W,F-HD  . famotidine  10 mg Per Tube Daily  . feeding supplement (PRO-STAT SUGAR FREE 64)  30 mL Per Tube BID  . free water  20 mL Per Tube Q4H  . insulin aspart  0-9 Units Subcutaneous Q4H  . mouth rinse  15 mL Mouth Rinse q12n4p  . midodrine  5 mg Oral BID WC  . multivitamin  15 mL Per Tube QHS  . nutrition supplement (JUVEN)  1 packet Per Tube BID BM  . pantoprazole (PROTONIX) IV  40 mg Intravenous Daily  . polyethylene glycol  17 g Oral Daily   acetaminophen (TYLENOL) oral liquid 160 mg/5 mL **OR** acetaminophen **OR** acetaminophen, albumin human, albuterol, ipratropium, lip balm, Melatonin, nitroGLYCERIN, ondansetron, promethazine  Assessment/ Plan:  [name] is a 85 y.o. black female with diabetes mellitus type II, hypertension, hyperlipidemia, coronary disease status post CABG, history of stroke, CKD, was admitted on1/27/2020. Originally admitted to [place] hospitalwith Achalasiaand 50 pound weight loss, dysarthria since November 2019. Now transitioned to [facility] on  02/26/18 to initiate hemodialysis. First hemodialysis treatment on 1/28 through [facility].   1. End Stage Renal Disease.   No signs of renal recovery.  - Will Continue MWF schedule - Tolerating treatments so far. Ultrafiltration is limited. Unable to sit for dialysis treatment yet. Working with physical therapy.  - Family is  desiring peritoneal dialysis on discharge. Will attempt peritoneal dialysis catheter placement next week by Dr. Lucky Cowboy.  - Next hemodialysis treatment on Monday. Will attempt hemodialysis in chair again on Monday. Monitor daily for dialysis need - requiring IV albumin with hemodialysis treatments.   2. Hypotension: blood pressure low.  - midodrine started.   3. Anemia of chronic kidney disease:  Macrocytic. Hemoglobin 7.7. with thrombocytopenia.  - EPO with HD treatment  4. Malnutrition with achalasia: G tube placed. Tolerating tube feeds.  Albumin improved to 2.1  - IV albumin with HD treatment   Overall prognosis is poor and high risk with each procedure. Son is a physician in [place] who is Scientist, research (medical). Son, Dr. Mayer Masker, wants aggressive measures to continue. Several meetings have occurred during this admission reviewing risks and benefits of continuing dialysis and proceeding with peritoneal dialysis at home.    LOS: [place] 2/15/202012:16 PM

## 2018-03-18 LAB — GLUCOSE, CAPILLARY
Glucose-Capillary: 244 mg/dL — ABNORMAL HIGH (ref 70–99)
Glucose-Capillary: 277 mg/dL — ABNORMAL HIGH (ref 70–99)
Glucose-Capillary: 218 mg/dL — ABNORMAL HIGH (ref 70–99)
Glucose-Capillary: 236 mg/dL — ABNORMAL HIGH (ref 70–99)
Glucose-Capillary: 227 mg/dL — ABNORMAL HIGH (ref 70–99)

## 2018-03-18 NOTE — Progress Notes (Signed)
[facility] [place] NAME: [name]    MR#:  [id number]  DATE OF BIRTH:  [birth year]  SUBJECTIVE:  CHIEF COMPLAINT:  No chief complaint on file.  Afebrile.  Patient nonverbal.  Moans at times.  Tolerating tube feeds.  REVIEW OF SYSTEMS:  Review of Systems  Unable to perform ROS: Mental status change   DRUG ALLERGIES:   Allergies  Allergen Reactions  . Reglan [Metoclopramide]     Pt's son reports this causes patient tremors and neurologic issues   . Contrast Media [Iodinated Diagnostic Agents]     Sensitivity, kidney issues    VITALS:  Blood pressure 101/64, pulse 90, temperature 98.4 F (36.9 C), temperature source Axillary, resp. rate 18, height 5\' 3"  (1.6 m), weight 79 kg, SpO2 100 %.  PHYSICAL EXAMINATION:  Physical Exam  GENERAL:  85 y.o.-year-old elderly patient lying in the bed with no acute distress.  EYES: Pupils equal, round, reactive to light . No scleral icterus.  HEENT: Head atraumatic, normocephalic.  NECK:  Supple, no jugular venous distention. No thyroid enlargement, no tenderness.  LUNGS: Bilateral rhonchi Right chest permacath present CARDIOVASCULAR: S1, S2 normal. No  rubs, or gallops.  ABDOMEN: Soft, nontender, nondistended. Bowel sounds present. No organomegaly or mass.  EXTREMITIES: 2+ bilateral pedal edema noted.   bilateral hand edema .  No cyanosis, or clubbing.  NEUROLOGIC: Not following commands today.  Appears globally very weak.  Not following instructions PSYCHIATRIC: The patient is drowsy  SKIN:   Has a stage II pressure ulcer in the sacral area   LABORATORY PANEL:   CBC Recent Labs  Lab 03/17/18 0555  WBC 7.2  HGB 7.7*  HCT 27.6*  PLT 101*   ------------------------------------------------------------------------------------------------------------------  Chemistries  Recent Labs  Lab 03/17/18 0555  NA 136  K 4.4  CL 99  CO2 27  GLUCOSE 224*  BUN 65*  CREATININE 1.76*    CALCIUM 8.1*  MG 2.1  AST 87*  ALT 48*  ALKPHOS 168*  BILITOT 1.3*   ------------------------------------------------------------------------------------------------------------------  Cardiac Enzymes Recent Labs  Lab 03/11/18 2118  TROPONINI 0.91*   ------------------------------------------------------------------------------------------------------------------  RADIOLOGY:  No results found.  EKG:   Orders placed or performed during the hospital encounter of 02/26/18  . EKG 12-Lead  . EKG 12-Lead  . EKG 12-Lead  . EKG 12-Lead    ASSESSMENT AND PLAN:   [name] is an16 y.o.femalewith multiple comorbidities including diabetes type 2, hypertension, hyperlipidemia, coronary disease status post CABG, history of stroke, and CKD who was admitted on1/27/2020. Originally admitted to [facility]withdysphagia and achalasiaand 50 pound weight loss in 12/2017.Status post treatment for ESBL Klebsiella aspiration pneumonia, enterococcus UTI. Admitted for dialysis.  1.  Acute renal failure on CKD stage III- admitted with uremia and encephalopathy Progressed to end-stage renal disease - on hemodialysis.. Working on setting up outpatient dialysis.  Patient has a right chest permacath -Receiving IV albumin during dialysis as needed -Vascular has been consulted for possible PD catheter placement for early next week -Lengthy family discussion held on Friday.  Decision made to place peritoneal dialysis as requested by family.  Plan is to discharge home on peritoneal dialysis  2.  Acute on chronic combined heart failure-echo with moderate mitral regurgitation and bilateral pleural effusion.  Presented with anasarca and hypoalbuminemia -Volume overload improving with dialysis. -Patient has finished her antibiotics for aspiration pneumonia. -Continue oxygen  3.  Dysphagia secondary to achalasia- s/p Botox injections but causing pharyngeal  muscle weakness. -Currently  status post PEG tube and tube feeds  4.  Diabetes mellitus-appreciate diabetes coordinator's input. On SSI  5.  Left temporal and frontal CVA-on Plavix and statin  6.  Anemia-likely anemia of chronic disease . -transfusion if hemoglobin is less than 7.  Continue EPO with dialysis.  7.  Acute encephalopathy-initially was in uremic encephalopathy with decreased mentation.  Now  confused.  Underlying delirium .  Continue to monitor. - needing phenergan PRN prior to dialysis as she is still screaming during dialysis - Family refusing haldol.  Physical therapy consulted- patient was able to sit in a chair yesterday.  But seemed uncomfortable.  I tried calling patient's son [name] multiple times.  Unable to get through.  Called daughter [name] discussed with her.  Poor prognosis.  Continue treatment as requested by family.  High risk for deterioration.  All the records are reviewed and case discussed with Care Management/Social Worker Management plans discussed with the patient, family and they are in agreement.  CODE STATUS: Full code  TOTAL TIME TAKING CARE OF THIS PATIENT: 25 minutes.   [name] M.D on 03/18/2018 at 10:45 AM  Between 7am to 6pm - Pager - [phone]  After 6pm go to [email or web address] - password [email or web address] [facility]  Office  [phone]  CC: Primary care physician; Deland Pretty, MD

## 2018-03-18 NOTE — Progress Notes (Signed)
[facility]  ROUNDING NOTE   Subjective:   Not very interactive this morning. No family at bedside. No overnight events reported.   Objective:  Vital signs in last 24 hours:  Temp:  [98.3 F (36.8 C)-98.6 F (37 C)] 98.3 F (36.8 C) (02/16 1143) Pulse Rate:  [50-92] 89 (02/16 1143) Resp:  [18-21] 18 (02/16 0456) BP: (85-112)/(42-67) 96/42 (02/16 1143) SpO2:  [77 %-100 %] 100 % (02/16 1143) Weight:  [79 kg] 79 kg (02/16 0456)  Weight change: 0.7 kg Filed Weights   03/16/18 1541 03/17/18 0420 03/18/18 0456  Weight: 77.8 kg 77.4 kg 79 kg    Intake/Output: I/O last 3 completed shifts: In: 5784 [NG/GT:1555] Out: 0    Intake/Output this shift:  Total I/O In: 30 [I.V.:30] Out: 0   Physical Exam: General: NAD, Laying in bed  Head: Normocephalic, atraumatic. Moist oral mucosal membranes  Eyes: Anicteric, PERRL  Neck: Supple, trachea midline  Lungs:  Clear to auscultation  Heart: Regular rate and rhythm  Abdomen:  Soft, nontender, +G-tube  Extremities: ++ dependent and peripheral edema.  Neurologic: Lethargic, not following commands.   Skin: No lesions  Access: RIJ permcath    Basic Metabolic Panel: Recent Labs  Lab 03/12/18 0440 03/13/18 0504 03/14/18 0508 03/17/18 0555  NA 137 138 136 136  K 3.7 3.7 3.9 4.4  CL 98 100 98 99  CO2 32 34* 33* 27  GLUCOSE 87 201* 234* 224*  BUN 76* 50* 73* 65*  CREATININE 2.23* 1.98* 2.30* 1.76*  CALCIUM 8.8* 8.5* 8.5* 8.1*  MG  --   --   --  2.1  PHOS  --   --   --  2.0*    Liver Function Tests: Recent Labs  Lab 03/17/18 0555  AST 87*  ALT 48*  ALKPHOS 168*  BILITOT 1.3*  PROT 5.8*  ALBUMIN 2.1*   No results for input(s): LIPASE, AMYLASE in the last 168 hours. No results for input(s): AMMONIA in the last 168 hours.  CBC: Recent Labs  Lab 03/14/18 0508 03/17/18 0555  WBC 5.6 7.2  NEUTROABS  --  5.8  HGB 7.6* 7.7*  HCT 26.4* 27.6*  MCV 105.6* 109.1*  PLT 91* 101*    Cardiac  Enzymes: Recent Labs  Lab 03/11/18 1629 03/11/18 2118  TROPONINI 0.90* 0.91*    BNP: Invalid input(s): POCBNP  CBG: Recent Labs  Lab 03/17/18 2024 03/17/18 2341 03/18/18 0450 03/18/18 0751 03/18/18 1141  GLUCAP 217* 220* 244* 277* 218*    Microbiology: Results for orders placed or performed during the hospital encounter of 02/26/18  MRSA PCR Screening     Status: Abnormal   Collection Time: 02/26/18  5:39 PM  Result Value Ref Range Status   MRSA by PCR POSITIVE (A) NEGATIVE Final    Comment:        The GeneXpert MRSA Assay (FDA approved for NASAL specimens only), is one component of a comprehensive MRSA colonization surveillance program. It is not intended to diagnose MRSA infection nor to guide or monitor treatment for MRSA infections. CRITICAL RESULT CALLED TO, READ BACK BY AND VERIFIED WITH:  CALLED TO DANA DUGGINS RN @1920  Performed at [facility], [address] [place], [place] [address]   CULTURE, BLOOD (ROUTINE X 2) w Reflex to ID Panel     Status: None   Collection Time: 03/06/18  9:01 PM  Result Value Ref Range Status   Specimen Description BLOOD LEFT ANTECUBITAL  Final   Special Requests  Final    BOTTLES DRAWN AEROBIC ONLY Blood Culture adequate volume   Culture   Final    NO GROWTH 5 DAYS Performed at [facility], [address] [place], [place] [address]    Report Status 03/11/2018 FINAL  Final  CULTURE, BLOOD (ROUTINE X 2) w Reflex to ID Panel     Status: None   Collection Time: 03/06/18  9:37 PM  Result Value Ref Range Status   Specimen Description BLOOD LEFT ANTECUBITAL  Final   Special Requests   Final    BOTTLES DRAWN AEROBIC AND ANAEROBIC Blood Culture adequate volume   Culture   Final    NO GROWTH 5 DAYS Performed at [facility], [address] [place], [place] [address]    Report Status 03/11/2018 FINAL  Final    Coagulation Studies: No results for input(s): LABPROT, INR in the last 72  hours.  Urinalysis: No results for input(s): COLORURINE, LABSPEC, PHURINE, GLUCOSEU, HGBUR, BILIRUBINUR, KETONESUR, PROTEINUR, UROBILINOGEN, NITRITE, LEUKOCYTESUR in the last 72 hours.  Invalid input(s): APPERANCEUR    Imaging: No results found.   Medications:   . albumin human 25 g (03/10/18 1946)  . feeding supplement (OSMOLITE 1.5 CAL) 1,000 mL (03/16/18 1652)   . atorvastatin  40 mg Per Tube q1800  . B-complex with vitamin C  1 tablet Per Tube Daily  . chlorhexidine  15 mL Mouth Rinse BID  . clopidogrel  75 mg Per Tube QHS  . clotrimazole  1 Applicatorful Vaginal QHS  . collagenase   Topical Daily  . epoetin (EPOGEN/PROCRIT) injection  10,000 Units Intravenous Q M,W,F-HD  . famotidine  10 mg Per Tube Daily  . feeding supplement (PRO-STAT SUGAR FREE 64)  30 mL Per Tube BID  . free water  20 mL Per Tube Q4H  . insulin aspart  0-9 Units Subcutaneous Q4H  . mouth rinse  15 mL Mouth Rinse q12n4p  . midodrine  5 mg Oral BID WC  . multivitamin  15 mL Per Tube QHS  . nutrition supplement (JUVEN)  1 packet Per Tube BID BM  . pantoprazole (PROTONIX) IV  40 mg Intravenous Daily  . polyethylene glycol  17 g Oral Daily   acetaminophen (TYLENOL) oral liquid 160 mg/5 mL **OR** acetaminophen **OR** acetaminophen, albumin human, albuterol, ipratropium, lip balm, Melatonin, nitroGLYCERIN, ondansetron, promethazine  Assessment/ Plan:  [name] is a 85 y.o. black female with diabetes mellitus type II, hypertension, hyperlipidemia, coronary disease status post CABG, history of stroke, CKD, was admitted on1/27/2020. Originally admitted to [facility]with Achalasiaand 50 pound weight loss, dysarthria since November 2019. Now transitioned to [facility] on 02/26/18 to initiate hemodialysis. First hemodialysis treatment on 1/28 through [facility].   1. End Stage Renal Disease.   No signs of renal recovery.  - Will Continue MWF schedule - Ultrafiltration has been limited. Unable  to sit for dialysis treatment yet. Working with physical therapy.  - Family is desiring peritoneal dialysis on discharge. Will attempt peritoneal dialysis catheter placement next week by Dr. Lucky Cowboy.  - Next hemodialysis treatment on Monday. Will attempt hemodialysis in chair again on Monday. Ultrafiltration as tolerated. -  Monitor daily for dialysis need - requiring IV albumin with hemodialysis treatments.   2. Hypotension: blood pressure low. 96/42 - midodrine  .   3. Anemia of chronic kidney disease:  Macrocytic. Hemoglobin 7.7 macrocytic. with thrombocytopenia.  - EPO with HD treatment  4. Malnutrition with achalasia: G tube placed. Tolerating tube feeds.  Albumin improved to 2.1  - IV albumin with HD treatment   Overall prognosis is poor and high risk with each procedure. Son is a physician in [place] who is Scientist, research (medical). Son, Dr. Mayer [name], wants aggressive measures to continue. Several meetings have occurred during this admission reviewing risks and benefits of continuing dialysis and proceeding with peritoneal dialysis at home.    LOS: [place] 2/16/202012:32 PM

## 2018-03-19 LAB — CBC WITH DIFFERENTIAL/PLATELET
ABS IMMATURE GRANULOCYTES: 0.04 10*3/uL (ref 0.00–0.07)
Basophils Absolute: 0 10*3/uL (ref 0.0–0.1)
Basophils Relative: 0 %
Eosinophils Absolute: 0.1 10*3/uL (ref 0.0–0.5)
Eosinophils Relative: 1 %
HCT: 27.1 % — ABNORMAL LOW (ref 36.0–46.0)
Hemoglobin: 7.7 g/dL — ABNORMAL LOW (ref 12.0–15.0)
Immature Granulocytes: 1 %
Lymphocytes Relative: 7 %
Lymphs Abs: 0.5 10*3/uL — ABNORMAL LOW (ref 0.7–4.0)
MCH: 31 pg (ref 26.0–34.0)
MCHC: 28.4 g/dL — ABNORMAL LOW (ref 30.0–36.0)
MCV: 109.3 fL — ABNORMAL HIGH (ref 80.0–100.0)
MONOS PCT: 11 %
Monocytes Absolute: 0.9 10*3/uL (ref 0.1–1.0)
Neutro Abs: 6.5 10*3/uL (ref 1.7–7.7)
Neutrophils Relative %: 80 %
Platelets: 123 10*3/uL — ABNORMAL LOW (ref 150–400)
RBC: 2.48 MIL/uL — ABNORMAL LOW (ref 3.87–5.11)
RDW: 20.2 % — ABNORMAL HIGH (ref 11.5–15.5)
WBC: 8.1 10*3/uL (ref 4.0–10.5)
nRBC: 0.4 % — ABNORMAL HIGH (ref 0.0–0.2)

## 2018-03-19 LAB — GLUCOSE, CAPILLARY
GLUCOSE-CAPILLARY: 181 mg/dL — AB (ref 70–99)
Glucose-Capillary: 120 mg/dL — ABNORMAL HIGH (ref 70–99)
Glucose-Capillary: 133 mg/dL — ABNORMAL HIGH (ref 70–99)
Glucose-Capillary: 199 mg/dL — ABNORMAL HIGH (ref 70–99)
Glucose-Capillary: 228 mg/dL — ABNORMAL HIGH (ref 70–99)
Glucose-Capillary: 246 mg/dL — ABNORMAL HIGH (ref 70–99)

## 2018-03-19 MED ORDER — INSULIN ASPART 100 UNIT/ML ~~LOC~~ SOLN
2.0000 [IU] | SUBCUTANEOUS | Status: AC
  Filled 2018-03-19: qty 0.02

## 2018-03-19 NOTE — Progress Notes (Signed)
Inpatient Diabetes Program Recommendations  AACE/ADA: New Consensus Statement on Inpatient Glycemic Control  Target Ranges:  Prepandial:   less than 140 mg/dL      Peak postprandial:   less than 180 mg/dL (1-2 hours)      Critically ill patients:  140 - 180 mg/dL   Results for CESILIA, SHINN (MRN [id number]) as of 03/19/2018 11:46  Ref. Range 03/18/2018 07:51 03/18/2018 11:41 03/18/2018 16:37 03/18/2018 20:38 03/19/2018 00:06 03/19/2018 03:57 03/19/2018 08:51  Glucose-Capillary Latest Ref Range: 70 - 99 mg/dL 277 (H) 218 (H) 236 (H) 227 (H) 246 (H) 228 (H) 181 (H)   Review of Glycemic Control  Diabetes history:DM2 Outpatient Diabetes medications:Glipizide XL 10 mg QAM Current orders for Inpatient glycemic control:Novolog 0-9 units Q4H, Osmolite @ 40 ml/hr  Inpatient Diabetes Program Recommendations: Insulin -Tube FeedingCoverage:Please consider ordering Novolog 2 units Q4H for tube feeding coverage. If tube feeding is stopped or held then Novolog tube feeding should also be stopped or held.  Thanks, Barnie Alderman, RN, MSN, CDE Diabetes Coordinator Inpatient Diabetes Program [phone] (Team Pager from 8am to 5pm)

## 2018-03-19 NOTE — Progress Notes (Signed)
PT Cancellation Note  Patient Details Name: [name] MRN: [id number] DOB: 1927/03/07   Cancelled Treatment:    Reason Eval/Treat Not Completed: Patient at procedure or test/unavailable(Patient currently off unit for dialysis; will re-attempt at later time/date as medically appropriate and available.)   Dakota Vanwart H. Owens Shark, PT, DPT, NCS 03/19/18, 10:48 AM [phone]

## 2018-03-19 NOTE — Progress Notes (Signed)
Pre HD Tx   03/19/18 1003  Vital Signs  Temp 97.9 F (36.6 C)  Temp Source Axillary  Pulse Rate 91  Pulse Rate Source Monitor  Resp 18  BP (!) 107/46  BP Location Left Arm  BP Method Automatic  Patient Position (if appropriate) Lying  Oxygen Therapy  SpO2 100 %  O2 Device Nasal Cannula  O2 Flow Rate (L/min) 2 L/min  Pain Assessment  Pain Scale PAINAD  PAINAD (Pain Assessment in Advanced Dementia)  Breathing 1  Negative Vocalization 1  Facial Expression 0  Body Language 1  Consolability 1  PAINAD Score 4  Dialysis Weight  Weight 78.9 kg  Type of Weight Pre-Dialysis  Time-Out for Hemodialysis  What Procedure? HD  Pt Identifiers(min of two) First/Last Name;MRN/Account#  Correct Site? Yes  Correct Side? Yes  Correct Procedure? Yes  Consents Verified? Yes  Rad Studies Available? N/A  Safety Precautions Reviewed? Yes  Engineer, civil (consulting) Number 5  Station Number 1  UF/Alarm Test Passed  Conductivity: Meter 14  Conductivity: Machine  14  pH 7.4  Reverse Osmosis main  Normal Saline Lot Number [id number]  Dialyzer Lot Number [id number]  Disposable Set Lot Number [id number]  Machine Temperature 98.6 F (37 C)  Musician and Audible Yes  Blood Lines Intact and Secured Yes  Pre Treatment Patient Checks  Vascular access used during treatment Catheter  Patient is receiving dialysis in a chair  (No chair today, tx in bed)  Hepatitis B Surface Antigen Results Negative  Date Hepatitis B Surface Antigen Drawn  (02/26/18)  Isolation Initiated Yes  Hepatitis B Surface Antibody  (<10)  Date Hepatitis B Surface Antibody Drawn 02/26/18  Hemodialysis Consent Verified Yes  Hemodialysis Standing Orders Initiated Yes  ECG (Telemetry) Monitor On Yes  Prime Ordered Normal Saline  Length of  DialysisTreatment -hour(s) 3 Hour(s)  Dialyzer Elisio 17H NR  Dialysate 2K, 2.5 Ca  Dialysate Flow Ordered 600  Blood Flow Rate Ordered 400 mL/min  Ultrafiltration Goal 1 Liters  Pre  Treatment Labs Renal panel;CBC  Dialysis Blood Pressure Support Ordered Albumin  Education / Care Plan  Dialysis Education Provided Yes  Documented Education in Care Plan Yes  Hemodialysis Catheter Right Subclavian Double-lumen;Permanent  Placement Date: 03/05/18   Orientation: Right  Access Location: Subclavian  Hemodialysis Catheter Type: Double-lumen;Permanent  Site Condition No complications  Blue Lumen Status Capped (Central line);Heparin locked  Red Lumen Status Capped (Central line);Heparin locked  Dressing Type Biopatch;Gauze/Drain sponge  Dressing Status Clean;Dry;Intact

## 2018-03-19 NOTE — Progress Notes (Signed)
[facility] [place] NAME: [name]    MR#:  [id number]  DATE OF BIRTH:  03/10/1927  SUBJECTIVE:  CHIEF COMPLAINT:  No chief complaint on file.  Afebrile.  Drowzy. Non verbal  Son at bedside  REVIEW OF SYSTEMS:  Review of Systems  Unable to perform ROS: Mental status change   DRUG ALLERGIES:   Allergies  Allergen Reactions  . Reglan [Metoclopramide]     Pt's son reports this causes patient tremors and neurologic issues   . Contrast Media [Iodinated Diagnostic Agents]     Sensitivity, kidney issues    VITALS:  Blood pressure (!) 106/55, pulse 97, temperature 98.2 F (36.8 C), temperature source Oral, resp. rate 20, height 5\' 3"  (1.6 m), weight 76.3 kg, SpO2 100 %.  PHYSICAL EXAMINATION:  Physical Exam  GENERAL:  85 y.o.-year-old elderly patient lying in the bed with no acute distress.  EYES: Pupils equal, round, reactive to light . No scleral icterus.  HEENT: Head atraumatic, normocephalic.  NECK:  Supple, no jugular venous distention. No thyroid enlargement, no tenderness.  LUNGS: Bilateral rhonchi Right chest permacath present CARDIOVASCULAR: S1, S2 normal. No  rubs, or gallops.  ABDOMEN: Soft, nontender, nondistended. Bowel sounds present. No organomegaly or mass.  PEG tube in place EXTREMITIES: 2+ bilateral pedal edema noted.   bilateral hand edema .  No cyanosis, or clubbing.  NEUROLOGIC: Not following commands today.  Appears globally very weak. Not following instructions PSYCHIATRIC: The patient is drowsy  SKIN:   Has a stage II pressure ulcer in the sacral area   LABORATORY PANEL:   CBC Recent Labs  Lab 03/19/18 0527  WBC 8.1  HGB 7.7*  HCT 27.1*  PLT 123*   ------------------------------------------------------------------------------------------------------------------  Chemistries  Recent Labs  Lab 03/17/18 0555  NA 136  K 4.4  CL 99  CO2 27  GLUCOSE 224*  BUN 65*  CREATININE 1.76*    CALCIUM 8.1*  MG 2.1  AST 87*  ALT 48*  ALKPHOS 168*  BILITOT 1.3*   ------------------------------------------------------------------------------------------------------------------  Cardiac Enzymes No results for input(s): TROPONINI in the last 168 hours. ------------------------------------------------------------------------------------------------------------------  RADIOLOGY:  No results found.  EKG:   Orders placed or performed during the hospital encounter of 02/26/18  . EKG 12-Lead  . EKG 12-Lead  . EKG 12-Lead  . EKG 12-Lead    ASSESSMENT AND PLAN:   [name] is an36 y.o.femalewith multiple comorbidities including diabetes type 2, hypertension, hyperlipidemia, coronary disease status post CABG, history of stroke, and CKD who was admitted on1/27/2020. Originally admitted to [facility]withdysphagia and achalasiaand 50 pound weight loss in 12/2017.Status post treatment for ESBL Klebsiella aspiration pneumonia, enterococcus UTI. Admitted for dialysis.  1.  Acute renal failure on CKD stage III- admitted with uremia and encephalopathy Progressed to end-stage renal disease - on hemodialysis.. Working on setting up outpatient dialysis.  Patient has a right chest permacath -Receiving IV albumin during dialysis as needed -Vascular has been consulted for possible PD catheter placement for early next week -Lengthy family discussion held on Friday along with multiple prior meetings -PD catheter placement scheduled for Thursday  2.  Acute on chronic combined heart failure-echo with moderate mitral regurgitation and bilateral pleural effusion.  Presented with anasarca and hypoalbuminemia -Volume overload persists.  Should improve with dialysis hopefully -Patient has finished her antibiotics for aspiration pneumonia. -Continue oxygen  3.  Dysphagia secondary to achalasia- s/p Botox injections but causing pharyngeal muscle weakness. -Currently status post  PEG tube and tube feeds  4.  Diabetes mellitus-appreciate diabetes coordinator's input. On SSI  5.  Left temporal and frontal CVA-on Plavix and statin  6.  Anemia-likely anemia of chronic disease . -transfusion if hemoglobin is less than 7.  Continue EPO with dialysis.  7.  Acute encephalopathy-initially was in uremic encephalopathy with decreased mentation.  Now  confused.  Underlying delirium .  Continue to monitor. - needing phenergan PRN prior to dialysis as she is still screaming during dialysis - Family refusing haldol.  Physical therapy consulted- patient was able to sit in a chair yesterday.   Discussed with son [name] detail  Poor prognosis.  Continue treatment as requested by family.  High risk for deterioration.  All the records are reviewed and case discussed with Care Management/Social Worker Management plans discussed with the patient, family and they are in agreement.  CODE STATUS: Full code  TOTAL TIME TAKING CARE OF THIS PATIENT: 25 minutes.   Neita Carp M.D on 03/19/2018 at 10:12 AM  Between 7am to 6pm - Pager - [phone]  After 6pm go to [email or web address] - password [email or web address] [facility]  Office  [phone]  CC: Primary care physician; Deland Pretty, MD

## 2018-03-19 NOTE — Progress Notes (Signed)
HD Tx End   03/19/18 1315  Vital Signs  Pulse Rate 94  Resp (!) 22  BP (!) 108/43  Oxygen Therapy  SpO2 100 %  During Hemodialysis Assessment  Blood Flow Rate (mL/min) 200 mL/min  Arterial Pressure (mmHg) -160 mmHg  Venous Pressure (mmHg) 140 mmHg  Transmembrane Pressure (mmHg) 60 mmHg  Ultrafiltration Rate (mL/min) 500 mL/min  Dialysate Flow Rate (mL/min) 600 ml/min  Conductivity: Machine  13.8  HD Safety Checks Performed Yes  Intra-Hemodialysis Comments Tx completed;Tolerated well  Hemodialysis Catheter Right Subclavian Double-lumen;Permanent  Placement Date: 03/05/18   Orientation: Right  Access Location: Subclavian  Hemodialysis Catheter Type: Double-lumen;Permanent  Site Condition No complications  Blue Lumen Status Infusing  Red Lumen Status Infusing

## 2018-03-19 NOTE — Progress Notes (Signed)
HD Post Tx    03/19/18 1330  Hand-Off documentation  Report given to (Full Name) [facility] RN   Report received from (Full Name) Trellis Paganini RN  Vital Signs  Pulse Rate 75  Resp 16  BP 109/73  Oxygen Therapy  SpO2 100 %  Dialysis Weight  Weight 78 kg  Type of Weight Post-Dialysis  Post-Hemodialysis Assessment  Rinseback Volume (mL) 250 mL  KECN 66.7 V  Dialyzer Clearance Lightly streaked  Duration of HD Treatment -hour(s) 3 hour(s)  Hemodialysis Intake (mL) 700 mL  UF Total -Machine (mL) 1800 mL  Net UF (mL) 1100 mL  Tolerated HD Treatment Yes  Hemodialysis Catheter Right Subclavian Double-lumen;Permanent  Placement Date: 03/05/18   Orientation: Right  Access Location: Subclavian  Hemodialysis Catheter Type: Double-lumen;Permanent  Site Condition No complications  Blue Lumen Status Flushed;Capped (Central line);Heparin locked  Red Lumen Status Flushed;Capped (Central line);Heparin locked  Catheter fill solution Heparin 1000 units/ml  Catheter fill volume (Arterial) 1.5 cc  Catheter fill volume (Venous) 1.5  Dressing Type Gauze/Drain sponge;Biopatch  Dressing Status Clean;Dry;Intact  Drainage Description None  Dressing Change Due 03/21/18  Post treatment catheter status Capped and Clamped

## 2018-03-19 NOTE — Progress Notes (Signed)
Pre HD Assessment  

## 2018-03-19 NOTE — Progress Notes (Signed)
HD tx start    03/19/18 1012  Vital Signs  Pulse Rate 67  Pulse Rate Source Monitor  Resp (!) 23  BP (!) 107/54  BP Location Left Arm  BP Method Automatic  Patient Position (if appropriate) Lying  Oxygen Therapy  SpO2 100 %  O2 Device Nasal Cannula  O2 Flow Rate (L/min) 2 L/min  During Hemodialysis Assessment  Blood Flow Rate (mL/min) 400 mL/min  Arterial Pressure (mmHg) -150 mmHg  Venous Pressure (mmHg) 120 mmHg  Transmembrane Pressure (mmHg) 60 mmHg  Ultrafiltration Rate (mL/min) 13.8 mL/min  Dialysate Flow Rate (mL/min) 600 ml/min  Conductivity: Machine  13.7  HD Safety Checks Performed Yes  Dialysis Fluid Bolus Normal Saline  Bolus Amount (mL) 250 mL  Intra-Hemodialysis Comments Tx initiated  Hemodialysis Catheter Right Subclavian Double-lumen;Permanent  Placement Date: 03/05/18   Orientation: Right  Access Location: Subclavian  Hemodialysis Catheter Type: Double-lumen;Permanent  Blue Lumen Status Infusing  Red Lumen Status Infusing

## 2018-03-19 NOTE — Progress Notes (Signed)
Son present and states that she will be going for her PD cath placement today by Dr Lucky Cowboy and that she needs to be NPO and to stop the feeds so he can do the procedure today, feeds stopped as requested by son.

## 2018-03-19 NOTE — Progress Notes (Signed)
[facility]  ROUNDING NOTE   Subjective:   Not very interactive this morning.  Patient son is at bedside.  He states that she was up all night and was interactive at night but this morning very sleepy.  Did not wake up and follow any commands.  Per nursing report patient sat in the chair for 3 to 4 hours on Saturday   Objective:  Vital signs in last 24 hours:  Temp:  [98.2 F (36.8 C)-98.4 F (36.9 C)] 98.2 F (36.8 C) (02/17 0400) Pulse Rate:  [89-99] 97 (02/17 0400) Resp:  [20] 20 (02/17 0400) BP: (96-123)/(42-63) 106/55 (02/17 0400) SpO2:  [100 %] 100 % (02/17 0400) Weight:  [76.3 kg] 76.3 kg (02/17 0500)  Weight change: -2.7 kg Filed Weights   03/17/18 0420 03/18/18 0456 03/19/18 0500  Weight: 77.4 kg 79 kg 76.3 kg    Intake/Output: I/O last 3 completed shifts: In: 2393 [I.V.:40; Other:34; NG/GT:2319] Out: 0    Intake/Output this shift:  No intake/output data recorded.  Physical Exam: General: NAD, Laying in bed  Head: Normocephalic, atraumatic. Moist oral mucosal membranes  Eyes: Anicteric, PERRL  Neck: Supple, trachea midline  Lungs:  Clear to auscultation  Heart: Regular rate and rhythm  Abdomen:  Soft, nontender, +G-tube  Extremities: ++ dependent and peripheral edema.  Neurologic: Lethargic, not following commands.   Skin: No lesions  Access: RIJ permcath    Basic Metabolic Panel: Recent Labs  Lab 03/13/18 0504 03/14/18 0508 03/17/18 0555  NA 138 136 136  K 3.7 3.9 4.4  CL 100 98 99  CO2 34* 33* 27  GLUCOSE 201* 234* 224*  BUN 50* 73* 65*  CREATININE 1.98* 2.30* 1.76*  CALCIUM 8.5* 8.5* 8.1*  MG  --   --  2.1  PHOS  --   --  2.0*    Liver Function Tests: Recent Labs  Lab 03/17/18 0555  AST 87*  ALT 48*  ALKPHOS 168*  BILITOT 1.3*  PROT 5.8*  ALBUMIN 2.1*   No results for input(s): LIPASE, AMYLASE in the last 168 hours. No results for input(s): AMMONIA in the last 168 hours.  CBC: Recent Labs  Lab 03/14/18 0508  03/17/18 0555 03/19/18 0527  WBC 5.6 7.2 8.1  NEUTROABS  --  5.8 6.5  HGB 7.6* 7.7* 7.7*  HCT 26.4* 27.6* 27.1*  MCV 105.6* 109.1* 109.3*  PLT 91* 101* 123*    Cardiac Enzymes: No results for input(s): CKTOTAL, CKMB, CKMBINDEX, TROPONINI in the last 168 hours.  BNP: Invalid input(s): POCBNP  CBG: Recent Labs  Lab 03/18/18 1637 03/18/18 2038 03/19/18 0006 03/19/18 0357 03/19/18 0851  GLUCAP 236* 227* 246* 228* 181*    Microbiology: Results for orders placed or performed during the hospital encounter of 02/26/18  MRSA PCR Screening     Status: Abnormal   Collection Time: 02/26/18  5:39 PM  Result Value Ref Range Status   MRSA by PCR POSITIVE (A) NEGATIVE Final    Comment:        The GeneXpert MRSA Assay (FDA approved for NASAL specimens only), is one component of a comprehensive MRSA colonization surveillance program. It is not intended to diagnose MRSA infection nor to guide or monitor treatment for MRSA infections. CRITICAL RESULT CALLED TO, READ BACK BY AND VERIFIED WITH:  CALLED TO DANA DUGGINS RN @1920  Performed at [facility], [address] [place], [place] [address]   CULTURE, BLOOD (ROUTINE X 2) w Reflex to ID Panel  Status: None   Collection Time: 03/06/18  9:01 PM  Result Value Ref Range Status   Specimen Description BLOOD LEFT ANTECUBITAL  Final   Special Requests   Final    BOTTLES DRAWN AEROBIC ONLY Blood Culture adequate volume   Culture   Final    NO GROWTH 5 DAYS Performed at [facility], [address] [place], [place] [address]    Report Status 03/11/2018 FINAL  Final  CULTURE, BLOOD (ROUTINE X 2) w Reflex to ID Panel     Status: None   Collection Time: 03/06/18  9:37 PM  Result Value Ref Range Status   Specimen Description BLOOD LEFT ANTECUBITAL  Final   Special Requests   Final    BOTTLES DRAWN AEROBIC AND ANAEROBIC Blood Culture adequate volume   Culture   Final    NO GROWTH 5 DAYS Performed at [facility], [address] [place], [place] [address]    Report Status 03/11/2018 FINAL  Final    Coagulation Studies: No results for input(s): LABPROT, INR in the last 72 hours.  Urinalysis: No results for input(s): COLORURINE, LABSPEC, PHURINE, GLUCOSEU, HGBUR, BILIRUBINUR, KETONESUR, PROTEINUR, UROBILINOGEN, NITRITE, LEUKOCYTESUR in the last 72 hours.  Invalid input(s): APPERANCEUR    Imaging: No results found.   Medications:   . albumin human 25 g (03/10/18 1946)  . feeding supplement (OSMOLITE 1.5 CAL) 1,000 mL (03/16/18 1652)   . atorvastatin  40 mg Per Tube q1800  . B-complex with vitamin C  1 tablet Per Tube Daily  . chlorhexidine  15 mL Mouth Rinse BID  . clopidogrel  75 mg Per Tube QHS  . clotrimazole  1 Applicatorful Vaginal QHS  . collagenase   Topical Daily  . epoetin (EPOGEN/PROCRIT) injection  10,000 Units Intravenous Q M,W,F-HD  . famotidine  10 mg Per Tube Daily  . feeding supplement (PRO-STAT SUGAR FREE 64)  30 mL Per Tube BID  . free water  20 mL Per Tube Q4H  . insulin aspart  0-9 Units Subcutaneous Q4H  . mouth rinse  15 mL Mouth Rinse q12n4p  . midodrine  5 mg Oral BID WC  . multivitamin  15 mL Per Tube QHS  . nutrition supplement (JUVEN)  1 packet Per Tube BID BM  . pantoprazole (PROTONIX) IV  40 mg Intravenous Daily  . polyethylene glycol  17 g Oral Daily   acetaminophen (TYLENOL) oral liquid 160 mg/5 mL **OR** acetaminophen **OR** acetaminophen, albumin human, albuterol, ipratropium, lip balm, Melatonin, nitroGLYCERIN, ondansetron, promethazine  Assessment/ Plan:  [name] is a 85 y.o. black female with diabetes mellitus type II, hypertension, hyperlipidemia, coronary disease status post CABG, history of stroke, CKD, was admitted on1/27/2020. [facility] [place] [facility] [place] 50 pound weight loss, dysarthria since November 2019. Now transitioned to [facility] on 02/26/18 to initiate hemodialysis. First hemodialysis  treatment on 1/28 through [facility].   1. End Stage Renal Disease.   No signs of renal recovery.  - Will Continue MWF schedule - Ultrafiltration has been limited. Unable to sit for dialysis treatment yet. Working with physical therapy.  Try to sit in the chair on medical floor reliably before sitting for dialysis - Family is desiring peritoneal dialysis on discharge.  PD catheter placement scheduled for Thursday  - Next hemodialysis treatment today.  Ultrafiltration as tolerated. -  Monitor daily for dialysis need - requiring IV albumin with hemodialysis treatments.   2. Hypotension: - midodrine  .   3. Anemia  of chronic kidney disease:  Macrocytic. Hemoglobin 7.7 macrocytic. with thrombocytopenia.  - EPO with HD treatment  4. Malnutrition with achalasia: G tube placed. Tolerating tube feeds.  Albumin improved to 2.1  - IV albumin with HD treatment   Overall prognosis is poor and high risk with each procedure. Son is a physician in [place] who is Scientist, research (medical). He is looking at PD as palliative option. He understands the risks of PD in setting of PEG tube in place. Son, Dr. Mayer [name], wants to continue aggressive measures to continue. Several meetings have occurred during this admission reviewing risks and benefits of continuing dialysis and proceeding with peritoneal dialysis at home.    LOS: Porter 2/17/20209:37 AM

## 2018-03-19 NOTE — Progress Notes (Signed)
   03/19/18 1342  Neurological  Level of Consciousness Alert  Orientation Level Other (comment) (responds to touch,disoriented/overall confusion/gargled spch)  Respiratory  Respiratory Pattern Regular;Unlabored  Chest Assessment Chest expansion symmetrical  Bilateral Breath Sounds Diminished  Cardiac  Pulse Regular  ECG Monitor Yes  Cardiac Rhythm Atrial fibrillation;ST  Vascular  R Radial Pulse +2  L Radial Pulse +2  Integumentary  Integumentary (WDL) X  Skin Color Appropriate for ethnicity  Skin Condition Dry  Musculoskeletal  Musculoskeletal (WDL) X  Generalized Weakness Yes  Psychosocial  Psychosocial (WDL) X  Patient Behaviors Agitated;Restless;Other (Comment) (disoriented, calling out frequently )  Emotional support given Given to patient

## 2018-03-20 ENCOUNTER — Inpatient Hospital Stay: Payer: Medicare Other

## 2018-03-20 LAB — GLUCOSE, CAPILLARY
GLUCOSE-CAPILLARY: 235 mg/dL — AB (ref 70–99)
Glucose-Capillary: 226 mg/dL — ABNORMAL HIGH (ref 70–99)
Glucose-Capillary: 230 mg/dL — ABNORMAL HIGH (ref 70–99)
Glucose-Capillary: 232 mg/dL — ABNORMAL HIGH (ref 70–99)
Glucose-Capillary: 247 mg/dL — ABNORMAL HIGH (ref 70–99)
Glucose-Capillary: 269 mg/dL — ABNORMAL HIGH (ref 70–99)
Glucose-Capillary: 278 mg/dL — ABNORMAL HIGH (ref 70–99)

## 2018-03-20 LAB — BLOOD GAS, ARTERIAL
ACID-BASE EXCESS: 4.7 mmol/L — AB (ref 0.0–2.0)
Bicarbonate: 32 mmol/L — ABNORMAL HIGH (ref 20.0–28.0)
FIO2: 1
O2 Saturation: 100 %
PCO2 ART: 58 mmHg — AB (ref 32.0–48.0)
Patient temperature: 37
pH, Arterial: 7.35 (ref 7.350–7.450)
pO2, Arterial: 447 mmHg — ABNORMAL HIGH (ref 83.0–108.0)

## 2018-03-20 MED ORDER — POLYETHYLENE GLYCOL 3350 17 G PO PACK: 17.0000 g | PACK | Freq: Every day | ORAL | Status: DC | PRN

## 2018-03-20 MED ORDER — SODIUM CHLORIDE 0.9 % IV SOLN
3.0000 g | Freq: Two times a day (BID) | INTRAVENOUS | Status: DC
  Administered 2018-03-21 – 2018-03-25 (×11): 3 g via INTRAVENOUS
  Filled 2018-03-20 (×13): qty 3

## 2018-03-20 MED ORDER — INSULIN ASPART 100 UNIT/ML ~~LOC~~ SOLN
3.0000 [IU] | SUBCUTANEOUS | Status: AC
  Administered 2018-03-20 – 2018-03-21 (×8): 3 [IU] via SUBCUTANEOUS
  Filled 2018-03-20 (×8): qty 1

## 2018-03-20 NOTE — Progress Notes (Signed)
Inpatient Diabetes Program Recommendations  AACE/ADA: New Consensus Statement on Inpatient Glycemic Control   Target Ranges:  Prepandial:   less than 140 mg/dL      Peak postprandial:   less than 180 mg/dL (1-2 hours)      Critically ill patients:  140 - 180 mg/dL  Results for [name], [name] (MRN [id number]) as of 03/20/2018 08:16  Ref. Range 03/19/2018 08:51 03/19/2018 13:09 03/19/2018 16:04 03/19/2018 20:36 03/20/2018 00:28 03/20/2018 05:06 03/20/2018 07:33  Glucose-Capillary Latest Ref Range: 70 - 99 mg/dL 181 (H) 120 (H) 133 (H) 199 (H)  Novolog 2 units 278 (H)  Novolog 5 units 269 (H)  Novolog 5 units 226 (H)    Review of Glycemic Control  Diabetes history:DM2 Outpatient Diabetes medications:Glipizide XL 10 mg QAM Current orders for Inpatient glycemic control:Novolog 0-9 units Q4H, Osmolite @ 40 ml/hr  Inpatient Diabetes Program Recommendations: Insulin -Tube FeedingCoverage: In reviewing the chart, noted Novolog 2 units Q4H for tube feeding coverage was ordered for 16:00 and 20:00 on 03/19/18. However, in reviewing chart, Novolog 2 units for tube feeding was charted as NOT GIVEN on 03/19/18.   Please consider ordering Novolog 3 units Q4H for tube feeding coverage. If tube feeding is stopped or held then Novolog tube feeding should also be stopped or held.  Thanks, Barnie Alderman, RN, MSN, CDE Diabetes Coordinator Inpatient Diabetes Program [phone] (Team Pager from 8am to 5pm)

## 2018-03-20 NOTE — Progress Notes (Signed)
 Vein & Vascular Surgery  Daily Progress Note   Vascular Surgery Communication Note  Will plan on laparoscopic insertion of a peritoneal dialysis catheter on Thursday 03/22/18 with Dr. Lucky Cowboy Will pre-op tomorrow  Discussed with [facility] Parents Amee Boothe PA-C 03/20/2018 1:16 PM

## 2018-03-20 NOTE — Progress Notes (Signed)
Pharmacy Antibiotic Note  [name] is a 85 y.o. female admitted on 02/26/2018 with aspiration PNA .  Pharmacy has been consulted for Unasyn dosing.  Plan: Unasyn 3 gm IV Q12H ordered to start on 2/18 @ 2300.   CrCl = 21.6 ml/min  Height: 5\' 3"  (160 cm) Weight: 168 lb 6.9 oz (76.4 kg) IBW/kg (Calculated) : 52.4  Temp (24hrs), Avg:98.1 F (36.7 C), Min:97.7 F (36.5 C), Max:98.7 F (37.1 C)  Recent Labs  Lab 03/14/18 0508 03/17/18 0555 03/19/18 0527  WBC 5.6 7.2 8.1  CREATININE 2.30* 1.76*  --     Estimated Creatinine Clearance: 21.6 mL/min (A) (by C-G formula based on SCr of 1.76 mg/dL (H)).    Allergies  Allergen Reactions  . Reglan [Metoclopramide]     Pt's son reports this causes patient tremors and neurologic issues   . Contrast Media [Iodinated Diagnostic Agents]     Sensitivity, kidney issues    Antimicrobials this admission:   >>    >>   Dose adjustments this admission:   Microbiology results:  BCx:   UCx:    Sputum:    MRSA PCR:   Thank you for allowing pharmacy to be a part of this patient's care.  [name] D 03/20/2018 10:49 PM

## 2018-03-20 NOTE — Progress Notes (Signed)
PT Contact Note   Patient suffers from multiple medical comorbidities (renal failure) which impairs his/her ability to perform daily activities like toileting, feeding, dressing, grooming, bathing in the home. A cane, walker, crutch will not resolve the patient's issue with performing activities of daily living. A high-back, reclining wheelchair with head support, articulating leg rests, anti-tip bars, removable arm rests and a self-releasing seat belt is required/recommended and will allow patient to safely perform daily activities.  Additionally, patient will need a pressure-relieving cushion to allow for safe and protective positioning while in WC.   Patient has a caregiver who can provide assistance with WC propulsion.  Phoebie Shad H. Owens Shark, PT, DPT, NCS 03/20/18, 2:35 PM [phone]

## 2018-03-20 NOTE — Progress Notes (Signed)
[place] [facility] [place] NAME: [name]    MR#:  [id number]  DATE OF BIRTH:  01/15/1928  SUBJECTIVE:  CHIEF COMPLAINT: Rapid response Rapid response was called for undetectable pulse ox and patient was placed on nonrebreather even before my arrival.  Patient was moaning and no family members at bedside.  Responding to painful stimuli and verbal commands Stat ABGs and chest x-ray ordered Moaning. Tolerating tube feeds  REVIEW OF SYSTEMS:  Review of Systems  Unable to perform ROS: Mental status change   DRUG ALLERGIES:   Allergies  Allergen Reactions  . Reglan [Metoclopramide]     Pt's son reports this causes patient tremors and neurologic issues   . Contrast Media [Iodinated Diagnostic Agents]     Sensitivity, kidney issues    VITALS:  Blood pressure 102/62, pulse 75, temperature 98.7 F (37.1 C), temperature source Oral, resp. rate 20, height 5\' 3"  (1.6 m), weight 76.4 kg, SpO2 (!) 84 %.  PHYSICAL EXAMINATION:  Physical Exam  GENERAL:  85 y.o.-year-old elderly patient lying in the bed with no acute distress.  EYES: Pupils equal, round, reactive to light . No scleral icterus.  HEENT: Head atraumatic, normocephalic.  NECK:  Supple, no jugular venous distention. No thyroid enlargement, no tenderness.  LUNGS: Bilateral rhonchi Right chest permacath present CARDIOVASCULAR: S1, S2 normal. No  rubs, or gallops.  ABDOMEN: Soft, nontender, nondistended. Bowel sounds present.  PEG tube in place EXTREMITIES: 2+ bilateral pedal edema noted.   bilateral hand edema .  Positive acral cyanosis, or clubbing.  NEUROLOGIC: Not following commands today.  Appears globally very weak. Not following instructions but responding to sternal rub and painstimuli PSYCHIATRIC: The patient is drowsy  SKIN:   Has a stage II pressure ulcer in the sacral area   LABORATORY PANEL:   CBC Recent Labs  Lab 03/19/18 0527  WBC 8.1  HGB 7.7*  HCT 27.1*  PLT  123*   ------------------------------------------------------------------------------------------------------------------  Chemistries  Recent Labs  Lab 03/17/18 0555  NA 136  K 4.4  CL 99  CO2 27  GLUCOSE 224*  BUN 65*  CREATININE 1.76*  CALCIUM 8.1*  MG 2.1  AST 87*  ALT 48*  ALKPHOS 168*  BILITOT 1.3*   ------------------------------------------------------------------------------------------------------------------  Cardiac Enzymes No results for input(s): TROPONINI in the last 168 hours. ------------------------------------------------------------------------------------------------------------------  RADIOLOGY:  Dg Chest 1 View  Result Date: 03/20/2018 CLINICAL DATA:  Pulmonary edema. EXAM: CHEST  1 VIEW COMPARISON:  Radiograph of March 15, 2018. FINDINGS: Stable cardiomegaly. Status post coronary bypass graft. No pneumothorax is noted. Right internal jugular dialysis catheter is unchanged in position. Stable left pleural effusion is noted with associated atelectasis or infiltrate. Minimal right pleural effusion is noted. Bony thorax is unremarkable. IMPRESSION: Stable left pleural effusion is noted with associated atelectasis or infiltrate. Stable smaller right pleural effusion. Electronically Signed   By: Marijo Conception, M.D.   On: 03/20/2018 21:45    EKG:   Orders placed or performed during the hospital encounter of 02/26/18  . EKG 12-Lead  . EKG 12-Lead  . EKG 12-Lead  . EKG 12-Lead    ASSESSMENT AND PLAN:   [name] is an85 y.o.femalewith multiple comorbidities including diabetes type 2, hypertension, hyperlipidemia, coronary disease status post CABG, history of stroke, and CKD who was admitted on1/27/2020. Originally admitted to [place] hospitalwithdysphagia and achalasiaand 50 pound weight loss in 12/2017.Status post treatment for ESBL Klebsiella aspiration pneumonia, enterococcus UTI. Admitted for dialysis.  #  Acute hypoxic respiratory  failure secondary to possible aspiration pneumonia /acral cyanosis Blood gas with no hypoxemia Patient does not require nonrebreather will change to oxygen via nasal cannula Keep extremities warm with gloves and mittens Chest x-ray with possible aspiration.  Speech therapy assessment in a.m. Started patient on Unasyn  aspiration precautions    #Acute renal failure on CKD stage III- admitted with uremia and encephalopathy Progressed to end-stage renal disease - on hemodialysis.. Working on setting up outpatient dialysis.  Patient has a right chest permacath -Receiving IV albumin during dialysis as needed -Vascular has been consulted for possible PD catheter placement for early next week -Lengthy family discussion held on Friday along with multiple prior meetings -PD catheter placement scheduled for Thursday. Needs to be NPO wednesday night  #Acute on chronic combined heart failure-echo with moderate mitral regurgitation and bilateral pleural effusion.  Presented with anasarca and hypoalbuminemia -Volume overload persists.  Should improve with dialysis hopefully -Patient has finished her antibiotics for aspiration pneumonia. -Continue oxygen via nasal cannula  #.  Dysphagia secondary to achalasia- s/p Botox injections but causing pharyngeal muscle weakness. -Currently status post PEG tube and tube feeds  #Diabetes mellitus-appreciate diabetes coordinator's input. On SSI  #.  Acute encephalopathy-initially was in uremic encephalopathy with decreased mentation.  Now  confused.  Underlying delirium .  Continue to monitor. - needing phenergan PRN prior to dialysis as she is still screaming during dialysis - Family refusing haldol per rounding physician's note   Poor prognosis.  Continue treatment as requested by family.  High risk for deterioration.  All the records are reviewed and case discussed with Care Management/Social Worker  Call placed to son at [phone] connected to  voicemail.  Could not leave a message to call back   CODE STATUS: Full code  TOTAL TIME TAKING CARE OF THIS PATIENT: 39 minutes.   Nicholes Mango M.D on 03/20/2018 at 10:43 PM  Between 7am to 6pm - Pager - [phone]  After 6pm go to [email or web address] - password [email or web address] [facility]  Office  [phone]  CC: Primary care physician; Deland Pretty, MD

## 2018-03-20 NOTE — Progress Notes (Signed)
[facility]  ROUNDING NOTE   Subjective:   Patient has her eyes closed.  Assisted to a chair by nursing this morning.  Was able to sit for more than 3 and half hours.  Patient keeps moaning loudly.  Answer only one question.  Did not follow any other commands.   Objective:  Vital signs in last 24 hours:  Temp:  [97.7 F (36.5 C)-98 F (36.7 C)] 97.7 F (36.5 C) (02/18 1138) Pulse Rate:  [61-98] 61 (02/18 1138) Resp:  [16-20] 16 (02/18 1138) BP: (93-126)/(70-95) 126/73 (02/18 1138) SpO2:  [100 %] 100 % (02/18 0512) Weight:  [76.4 kg] 76.4 kg (02/18 0500)  Weight change: 2.6 kg Filed Weights   03/19/18 1003 03/19/18 1330 03/20/18 0500  Weight: 78.9 kg 78 kg 76.4 kg    Intake/Output: I/O last 3 completed shifts: In: 838 [I.V.:10; Other:64; NG/GT:764] Out: 1100 [Other:1100]   Intake/Output this shift:  Total I/O In: 959 [Other:919; NG/GT:40] Out: -   Physical Exam: General: NAD, Laying in bed  Head: Normocephalic, atraumatic. Moist oral mucosal membranes  Eyes:  Did not open eyes  Neck: Supple,   Lungs:  Clear to auscultation  Heart:  Irregular rhythm  Abdomen:  Soft, nontender, +G-tube  Extremities:  Generalized anasarca  Neurologic: Lethargic, not following commands.   Skin: No lesions  Access: RIJ permcath    Basic Metabolic Panel: Recent Labs  Lab 03/14/18 0508 03/17/18 0555  NA 136 136  K 3.9 4.4  CL 98 99  CO2 33* 27  GLUCOSE 234* 224*  BUN 73* 65*  CREATININE 2.30* 1.76*  CALCIUM 8.5* 8.1*  MG  --  2.1  PHOS  --  2.0*    Liver Function Tests: Recent Labs  Lab 03/17/18 0555  AST 87*  ALT 48*  ALKPHOS 168*  BILITOT 1.3*  PROT 5.8*  ALBUMIN 2.1*   No results for input(s): LIPASE, AMYLASE in the last 168 hours. No results for input(s): AMMONIA in the last 168 hours.  CBC: Recent Labs  Lab 03/14/18 0508 03/17/18 0555 03/19/18 0527  WBC 5.6 7.2 8.1  NEUTROABS  --  5.8 6.5  HGB 7.6* 7.7* 7.7*  HCT 26.4* 27.6* 27.1*   MCV 105.6* 109.1* 109.3*  PLT 91* 101* 123*    Cardiac Enzymes: No results for input(s): CKTOTAL, CKMB, CKMBINDEX, TROPONINI in the last 168 hours.  BNP: Invalid input(s): POCBNP  CBG: Recent Labs  Lab 03/20/18 0506 03/20/18 0733 03/20/18 0855 03/20/18 1135 03/20/18 1600  GLUCAP 269* 226* 230* 247* 235*    Microbiology: Results for orders placed or performed during the hospital encounter of 02/26/18  MRSA PCR Screening     Status: Abnormal   Collection Time: 02/26/18  5:39 PM  Result Value Ref Range Status   MRSA by PCR POSITIVE (A) NEGATIVE Final    Comment:        The GeneXpert MRSA Assay (FDA approved for NASAL specimens only), is one component of a comprehensive MRSA colonization surveillance program. It is not intended to diagnose MRSA infection nor to guide or monitor treatment for MRSA infections. CRITICAL RESULT CALLED TO, READ BACK BY AND VERIFIED WITH:  CALLED TO DANA DUGGINS RN @1920  Performed at [facility], [address] [place], [place] [address]   CULTURE, BLOOD (ROUTINE X 2) w Reflex to ID Panel     Status: None   Collection Time: 03/06/18  9:01 PM  Result Value Ref Range Status   Specimen Description BLOOD LEFT ANTECUBITAL  Final   Special Requests   Final    BOTTLES DRAWN AEROBIC ONLY Blood Culture adequate volume   Culture   Final    NO GROWTH 5 DAYS Performed at [facility], [address] [place], [place] [address]    Report Status 03/11/2018 FINAL  Final  CULTURE, BLOOD (ROUTINE X 2) w Reflex to ID Panel     Status: None   Collection Time: 03/06/18  9:37 PM  Result Value Ref Range Status   Specimen Description BLOOD LEFT ANTECUBITAL  Final   Special Requests   Final    BOTTLES DRAWN AEROBIC AND ANAEROBIC Blood Culture adequate volume   Culture   Final    NO GROWTH 5 DAYS Performed at [facility], [address] [place], [place] [address]    Report Status 03/11/2018 FINAL  Final    Coagulation  Studies: No results for input(s): LABPROT, INR in the last 72 hours.  Urinalysis: No results for input(s): COLORURINE, LABSPEC, PHURINE, GLUCOSEU, HGBUR, BILIRUBINUR, KETONESUR, PROTEINUR, UROBILINOGEN, NITRITE, LEUKOCYTESUR in the last 72 hours.  Invalid input(s): APPERANCEUR    Imaging: No results found.   Medications:   . albumin human 25 g (03/19/18 1204)  . feeding supplement (OSMOLITE 1.5 CAL) 1,000 mL (03/20/18 1621)   . atorvastatin  40 mg Per Tube q1800  . B-complex with vitamin C  1 tablet Per Tube Daily  . chlorhexidine  15 mL Mouth Rinse BID  . clopidogrel  75 mg Per Tube QHS  . clotrimazole  1 Applicatorful Vaginal QHS  . collagenase   Topical Daily  . epoetin (EPOGEN/PROCRIT) injection  10,000 Units Intravenous Q M,W,F-HD  . famotidine  10 mg Per Tube Daily  . feeding supplement (PRO-STAT SUGAR FREE 64)  30 mL Per Tube BID  . free water  20 mL Per Tube Q4H  . insulin aspart  0-9 Units Subcutaneous Q4H  . insulin aspart  3 Units Subcutaneous Q4H  . mouth rinse  15 mL Mouth Rinse q12n4p  . midodrine  5 mg Oral BID WC  . multivitamin  15 mL Per Tube QHS  . nutrition supplement (JUVEN)  1 packet Per Tube BID BM  . pantoprazole (PROTONIX) IV  40 mg Intravenous Daily   acetaminophen (TYLENOL) oral liquid 160 mg/5 mL **OR** acetaminophen **OR** acetaminophen, albumin human, albuterol, ipratropium, lip balm, Melatonin, nitroGLYCERIN, ondansetron, polyethylene glycol, promethazine  Assessment/ Plan:  [name] is a 85 y.o. black female with diabetes mellitus type II, hypertension, hyperlipidemia, coronary disease status post CABG, history of stroke, CKD, was admitted on1/27/2020. [facility] 50 pound weight loss, dysarthria since November 2019. Now transitioned to [facility] on 02/26/18 to initiate hemodialysis. First hemodialysis treatment on 1/28 through [facility].   1. End Stage Renal Disease with generalized  edema   No signs of renal recovery.  - Will Continue MWF schedule - Ultrafiltration has been limited. -Sat in the chair for 3-1/2 to 4 hours today - Family is desiring peritoneal dialysis on discharge.  PD catheter placement scheduled for Thursday -Training for PD expected to start on Monday  - Next hemodialysis treatment on Wednesday.  Ultrafiltration as tolerated. -  Monitor daily for dialysis need - requiring IV albumin with hemodialysis treatments.   2. Hypotension: - midodrine  .   3. Anemia of chronic kidney disease:  Macrocytic. Hemoglobin 7.7 macrocytic. with thrombocytopenia.  - EPO with HD treatment  4. Malnutrition with achalasia: G tube placed. Tolerating  tube feeds.  Albumin remains low at 2.1 - IV albumin with HD treatment   Overall prognosis is poor and high risk with each procedure. Son is a physician in [place] who is Scientist, research (medical). He is looking at PD as palliative option. He understands the risks of PD in setting of PEG tube in place. Son, Dr. Mayer Masker, wants to continue aggressive measures to continue. Several meetings have occurred during this admission reviewing risks and benefits of continuing dialysis and proceeding with peritoneal dialysis at home.    LOS: [place] 2/18/20204:34 PM

## 2018-03-20 NOTE — Significant Event (Signed)
Rapid Response Event Note  Overview: Time Called: 2050 Arrival Time: 2052 Event Type: Neurologic  Initial Focused Assessment:Pt in bed, responsive to pain. VS WNL except for pulse ox, which was not reading due to pt's cold extremities.    Interventions: ABG, chest xray. Dr. Margaretmary Eddy to bedside.  Plan of Care (if not transferred):ABG was good, warm blankets applied to hands, will stay In room  Event Summary: Name of Physician Notified: Dr Margaretmary Eddy at 2052    at    Outcome: Stayed in room and stabalized  Event End Time: 2115  Javarian Jakubiak A

## 2018-03-20 NOTE — Progress Notes (Signed)
Nutrition Follow Up Note   DOCUMENTATION CODES:   Not applicable  INTERVENTION:   Continue Osmolite 1.5 @ goal rate of 70m/hr   Prostat 370mBID via tube   Free water flushes 2040m 4hrs  Regimen provides 1640kcal/day, 90g/day protein, 852m71mee water  Liquid MVI daily via tube  B-complex with C daily via tube  Juven Fruit Punch BID, each serving provides 95kcal and 2.5g of protein (amino acids glutamine and arginine)  Recommend recheck P lab  NUTRITION DIAGNOSIS:   Inadequate oral intake related to dysphagia as evidenced by NPO status.  GOAL:   Patient will meet greater than or equal to 90% of their needs  -met with tube feeds  MONITOR:   Labs, Weight trends, TF tolerance, Skin, I & O's  ASSESSMENT:   89 y74. female with a known history of acute metabolic encephalopathy, combined systolic diastolic congestive heart failure moderate MR, acute CVA, ESBL Klebsiella aspiration pneumonia and enterococcus UTI, [facility] non-STEMI [facility] weight loss and dysphagia due to achalasia. [facility]    Pt tolerating tube feeds well. Pt does have intermittent vomiting mainly when being repositioned. Pt still receiving daily HD. Plan is for PD catheter placement 2/20. Per chart, pt down ~24lbs since admit. Sacral wound still present; recommend continue supplements and vitamins. Pt with low P noted on 2/15; recommend recheck lab as pt likely still at refeed risk given the fact that her tube feeds have been intermittently held r/t son's request and daily HD. Pt with continued hyperglycemia; diabetes coordinator recommending Novolog 3units Q4H for tube feeding coverage.   Medications reviewed and include: B complex with C, plavix, epoetin, insulin, pepcid, MVI, protonix  Labs reviewed: K 4.4 wnl, P 2.0(L), Mg 2.1 wnl- 2/15 cbgs- 278, 269, 226, 230, 247 x 24 hrs  Diet Order:   Diet Order    None      EDUCATION NEEDS:   Not appropriate for education at this time  Skin:  Skin Assessment: Reviewed RN Assessment(unstageable wound buttocks, MASD )  Last BM:  2/17- TYPE 7  Height:   Ht Readings from Last 1 Encounters:  03/06/18 _0  (1.6 m)    Weight:   Wt Readings from Last 1 Encounters:  03/20/18 76.4 kg    Ideal Body Weight:  52.3 kg  BMI:  Body mass index is 29.84 kg/m.  Estimated Nutritional Needs:   Kcal:  1400-1600kcal/day   Protein:  78-89g/day   Fluid:  UOP +1L  CaseKoleen Distance RD, LDN Pager #- [phone]#- [phone] Hours Pager: [phone]

## 2018-03-20 NOTE — Progress Notes (Signed)
[facility] [place] NAME: [name]    MR#:  [id number]  DATE OF BIRTH:  04/14/1927  SUBJECTIVE:  CHIEF COMPLAINT:  No chief complaint on file.  Moaning. Tolerating tube feeds  REVIEW OF SYSTEMS:  Review of Systems  Unable to perform ROS: Mental status change   DRUG ALLERGIES:   Allergies  Allergen Reactions  . Reglan [Metoclopramide]     Pt's son reports this causes patient tremors and neurologic issues   . Contrast Media [Iodinated Diagnostic Agents]     Sensitivity, kidney issues    VITALS:  Blood pressure 126/73, pulse 61, temperature 97.7 F (36.5 C), temperature source Axillary, resp. rate 16, height 5\' 3"  (1.6 m), weight 76.4 kg, SpO2 100 %.  PHYSICAL EXAMINATION:  Physical Exam  GENERAL:  85 y.o.-year-old elderly patient lying in the bed with no acute distress.  EYES: Pupils equal, round, reactive to light . No scleral icterus.  HEENT: Head atraumatic, normocephalic.  NECK:  Supple, no jugular venous distention. No thyroid enlargement, no tenderness.  LUNGS: Bilateral rhonchi Right chest permacath present CARDIOVASCULAR: S1, S2 normal. No  rubs, or gallops.  ABDOMEN: Soft, nontender, nondistended. Bowel sounds present. No organomegaly or mass.  PEG tube in place EXTREMITIES: 2+ bilateral pedal edema noted.   bilateral hand edema .  No cyanosis, or clubbing.  NEUROLOGIC: Not following commands today.  Appears globally very weak. Not following instructions PSYCHIATRIC: The patient is drowsy  SKIN:   Has a stage II pressure ulcer in the sacral area   LABORATORY PANEL:   CBC Recent Labs  Lab 03/19/18 0527  WBC 8.1  HGB 7.7*  HCT 27.1*  PLT 123*   ------------------------------------------------------------------------------------------------------------------  Chemistries  Recent Labs  Lab 03/17/18 0555  NA 136  K 4.4  CL 99  CO2 27  GLUCOSE 224*  BUN 65*  CREATININE 1.76*  CALCIUM 8.1*  MG  2.1  AST 87*  ALT 48*  ALKPHOS 168*  BILITOT 1.3*   ------------------------------------------------------------------------------------------------------------------  Cardiac Enzymes No results for input(s): TROPONINI in the last 168 hours. ------------------------------------------------------------------------------------------------------------------  RADIOLOGY:  No results found.  EKG:   Orders placed or performed during the hospital encounter of 02/26/18  . EKG 12-Lead  . EKG 12-Lead  . EKG 12-Lead  . EKG 12-Lead  . EKG 12-Lead  . EKG 12-Lead    ASSESSMENT AND PLAN:   [name] is an42 y.o.femalewith multiple comorbidities including diabetes type 2, hypertension, hyperlipidemia, coronary disease status post CABG, history of stroke, and CKD who was admitted on1/27/2020. Originally admitted to [facility]ys[facility] and achalasiaand 50 pound weight loss in 12/2017.Status post treatment for ESBL Klebsiella aspiration pneumonia, enterococcus UTI. Admitted for dialysis.  1.  Acute renal failure on CKD stage III- admitted with uremia and encephalopathy Progressed to end-stage renal disease - on hemodialysis.. Working on setting up outpatient dialysis.  Patient has a right chest permacath -Receiving IV albumin during dialysis as needed -Vascular has been consulted for possible PD catheter placement for early next week -Lengthy family discussion held on Friday along with multiple prior meetings -PD catheter placement scheduled for Thursday. Needs to be NPO wednesday night  2.  Acute on chronic combined heart failure-echo with moderate mitral regurgitation and bilateral pleural effusion.  Presented with anasarca and hypoalbuminemia -Volume overload persists.  Should improve with dialysis hopefully -Patient has finished her antibiotics for aspiration pneumonia. -Continue oxygen  3.  Dysphagia secondary to achalasia- s/p Botox injections but  causing  pharyngeal muscle weakness. -Currently status post PEG tube and tube feeds  4.  Diabetes mellitus-appreciate diabetes coordinator's input. On SSI  5.  Left temporal and frontal CVA-on Plavix and statin  6.  Anemia-likely anemia of chronic disease . -transfusion if hemoglobin is less than 7.  Continue EPO with dialysis.  7.  Acute encephalopathy-initially was in uremic encephalopathy with decreased mentation.  Now  confused.  Underlying delirium .  Continue to monitor. - needing phenergan PRN prior to dialysis as she is still screaming during dialysis - Family refusing haldol.  Physical therapy consulted- patient was able to sit in a chair 2 days back but lethargic  Poor prognosis.  Continue treatment as requested by family.  High risk for deterioration.  All the records are reviewed and case discussed with Care Management/Social Worker Management plans discussed with the patient, family and they are in agreement.  CODE STATUS: Full code  TOTAL TIME TAKING CARE OF THIS PATIENT: 25 minutes.   Leia Alf Halei Hanover M.D on 03/20/2018 at 11:39 AM  Between 7am to 6pm - Pager - [phone]  After 6pm go to [email or web address] - password [email or web address] [facility]  Office  [phone]  CC: Primary care physician; Deland Pretty, MD

## 2018-03-20 NOTE — Progress Notes (Addendum)
Pt sat in the recliner per son request and sat in the chair for a total of 5 hours today. MD made aware.   [name] [employer]

## 2018-03-21 LAB — GLUCOSE, CAPILLARY
Glucose-Capillary: 118 mg/dL — ABNORMAL HIGH (ref 70–99)
Glucose-Capillary: 121 mg/dL — ABNORMAL HIGH (ref 70–99)
Glucose-Capillary: 149 mg/dL — ABNORMAL HIGH (ref 70–99)
Glucose-Capillary: 15 mg/dL — CL (ref 70–99)
Glucose-Capillary: 208 mg/dL — ABNORMAL HIGH (ref 70–99)
Glucose-Capillary: 210 mg/dL — ABNORMAL HIGH (ref 70–99)
Glucose-Capillary: 77 mg/dL (ref 70–99)

## 2018-03-21 MED ORDER — VANCOMYCIN HCL IN DEXTROSE 1-5 GM/200ML-% IV SOLN
1000.0000 mg | INTRAVENOUS | Status: DC
  Filled 2018-03-21: qty 200

## 2018-03-21 MED ORDER — COCONUT OIL OIL: 1.0000 "application " | TOPICAL_OIL | Status: DC | PRN

## 2018-03-21 NOTE — Progress Notes (Signed)
Called son and left a message to call back regarding consent for the procedure tomorrow.

## 2018-03-21 NOTE — Care Management (Signed)
Per Elvera Bicker dialysis liaison "plan for PD cath on Thursday, can start Monday at [facility] as long as she can transport and sit in the chair.  She will need to be able to go there for 10 days of training and sit for 6.5 hours in a recliner"

## 2018-03-21 NOTE — Progress Notes (Signed)
HD Tx Start   03/21/18 1210  Vital Signs  Pulse Rate 81  Resp 15  BP 105/60  Oxygen Therapy  SpO2 100 %  During Hemodialysis Assessment  Blood Flow Rate (mL/min) 400 mL/min  Arterial Pressure (mmHg) -160 mmHg  Venous Pressure (mmHg) 170 mmHg  Transmembrane Pressure (mmHg) 60 mmHg  Ultrafiltration Rate (mL/min) 680 mL/min  Dialysate Flow Rate (mL/min) 800 ml/min  Conductivity: Machine  14  HD Safety Checks Performed Yes  Dialysis Fluid Bolus Normal Saline  Bolus Amount (mL) 250 mL  Intra-Hemodialysis Comments Tx initiated  Hemodialysis Catheter Right Subclavian Double-lumen;Permanent  Placement Date: 03/05/18   Orientation: Right  Access Location: Subclavian  Hemodialysis Catheter Type: Double-lumen;Permanent  Site Condition No complications  Blue Lumen Status Infusing  Red Lumen Status Infusing

## 2018-03-21 NOTE — Progress Notes (Signed)
Pre HD Assessment   03/21/18 1200  Neurological  Level of Consciousness Responds to Pain  Orientation Level Disoriented X4  Respiratory  Respiratory Pattern Regular  Chest Assessment Chest expansion symmetrical  Bilateral Breath Sounds Diminished  Cardiac  Cardiac Rhythm Atrial fibrillation  Vascular  R Radial Pulse +2  L Radial Pulse +2  Integumentary  Integumentary (WDL) X  Skin Color Appropriate for ethnicity  Skin Condition Dry  Additional Integumentary Comments  (HD cath right chest, PEG tube)  Musculoskeletal  Musculoskeletal (WDL) X  Generalized Weakness Yes  Psychosocial  Psychosocial (WDL) X  Patient Behaviors Restless  Emotional support given Given to patient

## 2018-03-21 NOTE — Progress Notes (Signed)
[facility]  ROUNDING NOTE   Subjective:   Patient seen during dialysis treatment.  She is very somnolent.  Did not answer any questions.  Nurse reports that she has been making loud noises during the treatment off and on.  No meaningful interaction   HEMODIALYSIS FLOWSHEET:  Blood Flow Rate (mL/min): 400 mL/min Arterial Pressure (mmHg): -150 mmHg Venous Pressure (mmHg): 140 mmHg Transmembrane Pressure (mmHg): 50 mmHg Ultrafiltration Rate (mL/min): 180 mL/min Dialysate Flow Rate (mL/min): 600 ml/min Conductivity: Machine : 13.8 Conductivity: Machine : 13.8 Dialysis Fluid Bolus: Normal Saline Bolus Amount (mL): 250 mL Dialysate Change: (MD at bedside, 3K bath)  Hello   Objective:  Vital signs in last 24 hours:  Temp:  [97.6 F (36.4 C)-98.7 F (37.1 C)] 97.9 F (36.6 C) (02/19 1608) Pulse Rate:  [69-88] 69 (02/19 1608) Resp:  [14-20] 14 (02/19 1608) BP: (82-116)/(48-102) 103/59 (02/19 1608) SpO2:  [81 %-100 %] 97 % (02/19 1608) Weight:  [77.3 kg-78.3 kg] 77.3 kg (02/19 1525)  Weight change: -0.6 kg Filed Weights   03/21/18 0452 03/21/18 1155 03/21/18 1525  Weight: 78.3 kg 78.3 kg 77.3 kg    Intake/Output: I/O last 3 completed shifts: In: 17 [Other:919; NG/GT:40] Out: -    Intake/Output this shift:  Total I/O In: 260 [NG/GT:260] Out: 1016 [Other:1016]  Physical Exam: General: NAD, Laying in bed  Head: Normocephalic, atraumatic. Moist oral mucosal membranes  Eyes:  Did not open eyes  Neck: Supple,   Lungs:  Clear to auscultation  Heart:  Irregular rhythm  Abdomen:  Soft, nontender, +G-tube  Extremities:  Generalized anasarca  Neurologic: Lethargic, not following commands.   Skin: No lesions  Access: RIJ permcath    Basic Metabolic Panel: Recent Labs  Lab 03/17/18 0555  NA 136  K 4.4  CL 99  CO2 27  GLUCOSE 224*  BUN 65*  CREATININE 1.76*  CALCIUM 8.1*  MG 2.1  PHOS 2.0*    Liver Function Tests: Recent Labs  Lab  03/17/18 0555  AST 87*  ALT 48*  ALKPHOS 168*  BILITOT 1.3*  PROT 5.8*  ALBUMIN 2.1*   No results for input(s): LIPASE, AMYLASE in the last 168 hours. No results for input(s): AMMONIA in the last 168 hours.  CBC: Recent Labs  Lab 03/17/18 0555 03/19/18 0527  WBC 7.2 8.1  NEUTROABS 5.8 6.5  HGB 7.7* 7.7*  HCT 27.6* 27.1*  MCV 109.1* 109.3*  PLT 101* 123*    Cardiac Enzymes: No results for input(s): CKTOTAL, CKMB, CKMBINDEX, TROPONINI in the last 168 hours.  BNP: Invalid input(s): POCBNP  CBG: Recent Labs  Lab 03/21/18 0000 03/21/18 0438 03/21/18 0744 03/21/18 1145 03/21/18 1611  GLUCAP 208* 210* 149* 121* 80    Microbiology: Results for orders placed or performed during the hospital encounter of 02/26/18  MRSA PCR Screening     Status: Abnormal   Collection Time: 02/26/18  5:39 PM  Result Value Ref Range Status   MRSA by PCR POSITIVE (A) NEGATIVE Final    Comment:        The GeneXpert MRSA Assay (FDA approved for NASAL specimens only), is one component of a comprehensive MRSA colonization surveillance program. It is not intended to diagnose MRSA infection nor to guide or monitor treatment for MRSA infections. CRITICAL RESULT CALLED TO, READ BACK BY AND VERIFIED WITH:  CALLED TO DANA DUGGINS RN @1920  Performed at [facility], [address] [place], [place] [address]   CULTURE, BLOOD (ROUTINE X 2) w  Reflex to ID Panel     Status: None   Collection Time: 03/06/18  9:01 PM  Result Value Ref Range Status   Specimen Description BLOOD LEFT ANTECUBITAL  Final   Special Requests   Final    BOTTLES DRAWN AEROBIC ONLY Blood Culture adequate volume   Culture   Final    NO GROWTH 5 DAYS Performed at [facility], [address] [place], [place] [address]    Report Status 03/11/2018 FINAL  Final  CULTURE, BLOOD (ROUTINE X 2) w Reflex to ID Panel     Status: None   Collection Time: 03/06/18  9:37 PM  Result Value Ref Range Status    Specimen Description BLOOD LEFT ANTECUBITAL  Final   Special Requests   Final    BOTTLES DRAWN AEROBIC AND ANAEROBIC Blood Culture adequate volume   Culture   Final    NO GROWTH 5 DAYS Performed at [facility], [address], [place], [place] [address]    Report Status 03/11/2018 FINAL  Final    Coagulation Studies: No results for input(s): LABPROT, INR in the last 72 hours.  Urinalysis: No results for input(s): COLORURINE, LABSPEC, PHURINE, GLUCOSEU, HGBUR, BILIRUBINUR, KETONESUR, PROTEINUR, UROBILINOGEN, NITRITE, LEUKOCYTESUR in the last 72 hours.  Invalid input(s): APPERANCEUR    Imaging: Dg Chest 1 View  Result Date: 03/20/2018 CLINICAL DATA:  Pulmonary edema. EXAM: CHEST  1 VIEW COMPARISON:  Radiograph of March 15, 2018. FINDINGS: Stable cardiomegaly. Status post coronary bypass graft. No pneumothorax is noted. Right internal jugular dialysis catheter is unchanged in position. Stable left pleural effusion is noted with associated atelectasis or infiltrate. Minimal right pleural effusion is noted. Bony thorax is unremarkable. IMPRESSION: Stable left pleural effusion is noted with associated atelectasis or infiltrate. Stable smaller right pleural effusion. Electronically Signed   By: Marijo Conception, M.D.   On: 03/20/2018 21:45     Medications:   . albumin human 25 g (03/21/18 1256)  . ampicillin-sulbactam (UNASYN) IV 3 g (03/21/18 0949)  . feeding supplement (OSMOLITE 1.5 CAL) 1,000 mL (03/20/18 1621)  . [START ON 03/22/2018] vancomycin     . atorvastatin  40 mg Per Tube q1800  . B-complex with vitamin C  1 tablet Per Tube Daily  . chlorhexidine  15 mL Mouth Rinse BID  . clopidogrel  75 mg Per Tube QHS  . clotrimazole  1 Applicatorful Vaginal QHS  . collagenase   Topical Daily  . epoetin (EPOGEN/PROCRIT) injection  10,000 Units Intravenous Q M,W,F-HD  . famotidine  10 mg Per Tube Daily  . feeding supplement (PRO-STAT SUGAR FREE 64)  30 mL Per Tube BID  .  free water  20 mL Per Tube Q4H  . insulin aspart  0-9 Units Subcutaneous Q4H  . insulin aspart  3 Units Subcutaneous Q4H  . mouth rinse  15 mL Mouth Rinse q12n4p  . midodrine  5 mg Oral BID WC  . multivitamin  15 mL Per Tube QHS  . nutrition supplement (JUVEN)  1 packet Per Tube BID BM  . pantoprazole (PROTONIX) IV  40 mg Intravenous Daily   acetaminophen (TYLENOL) oral liquid 160 mg/5 mL **OR** acetaminophen **OR** acetaminophen, albumin human, albuterol, ipratropium, lip balm, Melatonin, nitroGLYCERIN, ondansetron, polyethylene glycol, promethazine  Assessment/ Plan:  [name] is a 85 y.o. black female with diabetes mellitus type II, hypertension, hyperlipidemia, coronary disease status post CABG, history of stroke, CKD, was admitted on1/27/2020. Originally admitted to [place] hospitalwith Achalasiaand 50 pound  weight loss, dysarthria since November 2019. Now transitioned to [facility] on 02/26/18 to initiate hemodialysis. First hemodialysis treatment on 1/28 through [facility].   1. End Stage Renal Disease with generalized edema   No signs of renal recovery.  - Will Continue MWF schedule - Ultrafiltration has been limited. -Sat in the chair for hemodialysis today with [facility] lift assist -Heart rhythm atrial fibrillation with heart rate varying from 80-120 during treatment - Family is desiring peritoneal dialysis on discharge.  PD catheter placement scheduled for Thursday -Training for PD expected to start on Monday  - Next hemodialysis treatment on Wednesday.  Ultrafiltration as tolerated. -  Monitor daily for dialysis need - requiring IV albumin with hemodialysis treatments.   2. Hypotension: - midodrine with hemodialysis.   3. Anemia of chronic kidney disease:  Macrocytic. Hemoglobin 7.7 macrocytic. with thrombocytopenia.  - EPO with HD treatment  4. Malnutrition with achalasia: G tube placed. Tolerating tube feeds.  Albumin remains low at 2.1 - IV albumin with HD  treatment      LOS: [place] 2/19/20204:40 PM

## 2018-03-21 NOTE — Progress Notes (Signed)
Physical Therapy Treatment Patient Details Name: [name] MRN: [id number] DOB: [birth year] Today's Date: 03/21/2018    History of Present Illness patient admitted to facility as transfer from [facility] for trial of dialysis and ongoing care.  Patient initially presented to [facility] 11/5 due to UTI, AMS, achalasia (causing significant weight loss); extended hospital course complicated by multiple medical comorbidities/situations including g-tube placement (11/10), respiratory failure requiring intubation (12/2-12/6), PNA, acute metabolic encephalopathy, AKI and multiple cerebral infarcts.  Since admission to this facility (1/27), patient with R temp fem cath (placed 1/27, now removed), converted to R IJ perm-cath; also noted with RR event resulting in transfer to CCU for hypotension (requiring pressors).  Currently on med-surg floor with g-tube, supplemental O2 (4L) and R UE PICC.,  Scheduled for PD catheter placement 03/22/18    PT Comments    No active effort or participation noted throughout session; maintains eyes closed and minimally responsive to therapist throughout.  Does moan intermittently with bilat hip flexion, and intermittently resistive to passive movement (especially with UEs).    Follow Up Recommendations        Equipment Recommendations  Wheelchair (measurements PT);Hospital bed(hoyer)    Recommendations for Other Services       Precautions / Restrictions Precautions Precautions: Fall Precaution Comments: NPO Restrictions Weight Bearing Restrictions: No    Mobility  Bed Mobility               General bed mobility comments: dep assist for all movement, repositioning  Transfers                 General transfer comment: unsafe/unable; OOB deferred until dialysis  Ambulation/Gait             General Gait Details: unsafe/unable; OOB deferred until dialysis   Stairs             Wheelchair Mobility    Modified Rankin (Stroke Patients Only)        Balance                                            Cognition Arousal/Alertness: Lethargic Behavior During Therapy: Flat affect                                          Exercises Other Exercises Other Exercises: Supine UE therex, 1x10, passive ROM: pronation/supination, elbow flex/ext, shoulder flex/ext/IR/ER (elevation to shoulder height only); supine LE therex, 1x12, passive ROM: ankle pumps, SAQs, heel slides, hip abduct/adduct.  No active effort or participation noted; maintains eyes closed and minimally responsive to therapist throughout.  Does moan intermittently with bilat hip flexion, and intermittently resistive to passive movement (especially with UEs).    General Comments        Pertinent Vitals/Pain Pain Assessment: Faces Faces Pain Scale: Hurts even more Pain Location: abdomen, hips? Pain Descriptors / Indicators: Moaning Pain Intervention(s): Limited activity within patient's tolerance;Monitored during session;Repositioned    Home Living                      Prior Function            PT Goals (current goals can now be found in the care plan section) Acute Rehab PT Goals Patient Stated Goal: none stated PT Goal  Formulation: Patient unable to participate in goal setting Time For Goal Achievement: 03/29/18 Potential to Achieve Goals: Fair Additional Goals Additional Goal #1: Assess and establish goals for OOB activities as medically appropriate. Progress towards PT goals: Not progressing toward goals - comment(unable to participate)    Frequency    Min 2X/week      PT Plan      Co-evaluation              AM-PAC PT "6 Clicks" Mobility   Outcome Measure  Help needed turning from your back to your side while in a flat bed without using bedrails?: Total Help needed moving from lying on your back to sitting on the side of a flat bed without using bedrails?: Total Help needed moving to and from a  bed to a chair (including a wheelchair)?: Total Help needed standing up from a chair using your arms (e.g., wheelchair or bedside chair)?: Total Help needed to walk in hospital room?: Total Help needed climbing 3-5 steps with a railing? : Total 6 Click Score: 6    End of Session   Activity Tolerance: Treatment limited secondary to medical complications (Comment) Patient left: in bed;with bed alarm set;with call bell/phone within reach   PT Visit Diagnosis: Muscle weakness (generalized) (M62.81);Pain;Adult, failure to thrive (R62.7)     Time: 9767-3419 PT Time Calculation (min) (ACUTE ONLY): 17 min  Charges:  $Therapeutic Exercise: 8-22 mins                     Alphonso Gregson H. Owens Shark, PT, DPT, NCS 03/21/18, 1:18 PM [phone]

## 2018-03-21 NOTE — Progress Notes (Signed)
[facility] [place] NAME: [name]    MR#:  [id number]  DATE OF BIRTH:  Feb 04, 1927  SUBJECTIVE:  CHIEF COMPLAINT:  No chief complaint on file.  Moaning. Tolerating tube feeds Hypoxic event last night-improved on oxygen, started antibiotics, x-ray does not show any new findings.  REVIEW OF SYSTEMS:  Review of Systems  Unable to perform ROS: Mental status change   DRUG ALLERGIES:   Allergies  Allergen Reactions  . Reglan [Metoclopramide]     Pt's son reports this causes patient tremors and neurologic issues   . Contrast Media [Iodinated Diagnostic Agents]     Sensitivity, kidney issues    VITALS:  Blood pressure (!) 103/59, pulse 69, temperature 97.9 F (36.6 C), temperature source Axillary, resp. rate 14, height 5\' 3"  (1.6 m), weight 77.3 kg, SpO2 97 %.  PHYSICAL EXAMINATION:  Physical Exam  GENERAL:  85 y.o.-year-old elderly patient lying in the bed with no acute distress.  EYES: Pupils equal, round, reactive to light . No scleral icterus.  HEENT: Head atraumatic, normocephalic.  NECK:  Supple, no jugular venous distention. No thyroid enlargement, no tenderness.  LUNGS: Bilateral rhonchi Right chest permacath present CARDIOVASCULAR: S1, S2 normal. No  rubs, or gallops.  ABDOMEN: Soft, nontender, nondistended. Bowel sounds present. No organomegaly or mass.  PEG tube in place EXTREMITIES: 2+ bilateral pedal edema noted.   bilateral hand edema .  No cyanosis, or clubbing.  NEUROLOGIC: Not following commands today.  Appears globally very weak. Not following instructions PSYCHIATRIC: The patient is drowsy  SKIN:  Has a stage II pressure ulcer in the sacral area.   LABORATORY PANEL:   CBC Recent Labs  Lab 03/19/18 0527  WBC 8.1  HGB 7.7*  HCT 27.1*  PLT 123*   ------------------------------------------------------------------------------------------------------------------  Chemistries  Recent Labs  Lab  03/17/18 0555  NA 136  K 4.4  CL 99  CO2 27  GLUCOSE 224*  BUN 65*  CREATININE 1.76*  CALCIUM 8.1*  MG 2.1  AST 87*  ALT 48*  ALKPHOS 168*  BILITOT 1.3*   ------------------------------------------------------------------------------------------------------------------  Cardiac Enzymes No results for input(s): TROPONINI in the last 168 hours. ------------------------------------------------------------------------------------------------------------------  RADIOLOGY:  Dg Chest 1 View  Result Date: 03/20/2018 CLINICAL DATA:  Pulmonary edema. EXAM: CHEST  1 VIEW COMPARISON:  Radiograph of March 15, 2018. FINDINGS: Stable cardiomegaly. Status post coronary bypass graft. No pneumothorax is noted. Right internal jugular dialysis catheter is unchanged in position. Stable left pleural effusion is noted with associated atelectasis or infiltrate. Minimal right pleural effusion is noted. Bony thorax is unremarkable. IMPRESSION: Stable left pleural effusion is noted with associated atelectasis or infiltrate. Stable smaller right pleural effusion. Electronically Signed   By: Marijo Conception, M.D.   On: 03/20/2018 21:45    EKG:   Orders placed or performed during the hospital encounter of 02/26/18  . EKG 12-Lead  . EKG 12-Lead  . EKG 12-Lead  . EKG 12-Lead    ASSESSMENT AND PLAN:   [name] is an85 y.o.femalewith multiple comorbidities including diabetes type 2, hypertension, hyperlipidemia, coronary disease status post CABG, history of stroke, and CKD who was admitted on1/27/2020. [facility] [place] [facility]withdys[facility]ag[facility] and achalasiaand 50 pound weight loss in 12/2017.Status post treatment for ESBL Klebsiella aspiration pneumonia, enterococcus UTI. Admitted for dialysis.  1.  Acute renal failure on CKD stage III- admitted with uremia and encephalopathy Progressed to end-stage renal disease - on hemodialysis.. Working on setting up  outpatient dialysis.   Patient has a right chest permacath - Receiving IV albumin during dialysis as needed - Vascular has been consulted for possible PD catheter placement for early next week - Lengthy family discussion held on Friday along with multiple prior meetings - PD catheter placement scheduled for Thursday. Needs to be NPO wednesday night  2. Acute on chronic combined heart failure-echo with moderate mitral regurgitation and bilateral  pleural effusion.  Presented with anasarca and hypoalbuminemia - Volume overload persists.  Should improve with dialysis hopefully - Patient has finished her antibiotics for aspiration pneumonia. - Continue oxygen. - 03/20/18 night, she had event of hypoxia, so started on antibiotic for aspiration again, I will give 5 days course.  Her oxygen saturation is stable now.  3. Dysphagia secondary to achalasia- s/p Botox injections but causing pharyngeal muscle weakness. - Currently status post PEG tube and tube feeds.  4.  Diabetes mellitus-appreciate diabetes coordinator's input. On SSI.  5.  Left temporal and frontal CVA-on Plavix and statin  6.  Anemia-likely anemia of chronic disease . -transfusion if hemoglobin is less than 7.  Continue EPO with dialysis.  7.  Acute encephalopathy-initially was in uremic encephalopathy with decreased mentation.  Now  confused.  Underlying delirium .  Continue to monitor. - needing phenergan PRN prior to dialysis as she is still screaming during dialysis - Family refusing haldol.  Physical therapy consulted- patient was able to sit in a chair   Poor prognosis.  Continue treatment as requested by family.  High risk for deterioration.  All the records are reviewed and case discussed with Care Management/Social Worker Management plans discussed with the patient, family and they are in agreement.  CODE STATUS: Full code  TOTAL TIME TAKING CARE OF THIS PATIENT: 25 minutes.   Vaughan Basta M.D on 03/21/2018 at 5:08  PM  Between 7am to 6pm - Pager - [phone]  After 6pm go to [email or web address] - password [email or web address] [facility]  Office  [phone]  CC: Primary care physician; Deland Pretty, MD

## 2018-03-21 NOTE — Progress Notes (Signed)
Post HD assessment    03/21/18 1523  Neurological  Level of Consciousness Responds to Pain  Orientation Level Disoriented X4  Respiratory  Respiratory Pattern Regular;Unlabored  Chest Assessment Chest expansion symmetrical  Cardiac  Pulse Irregular  ECG Monitor Yes  Cardiac Rhythm Atrial fibrillation  Vascular  R Radial Pulse +1  L Radial Pulse +1  Edema Generalized;Right upper extremity;Left upper extremity;Right lower extremity;Left lower extremity  Integumentary  Integumentary (WDL) X  Skin Color Appropriate for ethnicity  Musculoskeletal  Musculoskeletal (WDL) X  Generalized Weakness Yes  Assistive Device None  GU Assessment  Genitourinary (WDL) X  Genitourinary Symptoms  (HD)  Psychosocial  Psychosocial (WDL) X  Patient Behaviors Not interactive

## 2018-03-21 NOTE — Progress Notes (Signed)
Pre HD Tx   03/21/18 1155  Vital Signs  Temp 98.7 F (37.1 C)  Temp Source Axillary  Pulse Rate 85  Pulse Rate Source Monitor  Resp 16  BP (!) 97/59  BP Location Left Arm  BP Method Automatic  Patient Position (if appropriate) Sitting  Oxygen Therapy  SpO2 100 %  O2 Device Nasal Cannula  O2 Flow Rate (L/min) 2 L/min  PAINAD (Pain Assessment in Advanced Dementia)  Breathing 1  Negative Vocalization 1  Facial Expression 0  Body Language 0  Consolability 0  PAINAD Score 2  Dialysis Weight  Weight 78.3 kg  Type of Weight Pre-Dialysis  Time-Out for Hemodialysis  What Procedure?  (HD)  Pt Identifiers(min of two) First/Last Name;MRN/Account#  Correct Site? Yes  Correct Side? Yes  Correct Procedure? Yes  Consents Verified? Yes  Rad Studies Available? No  Safety Precautions Reviewed? Yes  Engineer, civil (consulting) Number 3  Station Number 1  UF/Alarm Test Passed  Conductivity: Meter 14  Conductivity: Machine  14  pH 7.2  Reverse Osmosis main  Normal Saline Lot Number [id number]  Dialyzer Lot Number [id number]  Disposable Set Lot Number [id number]  Machine Temperature 98.6 F (37 C)  Musician and Audible Yes  Blood Lines Intact and Secured Yes  Pre Treatment Patient Checks  Vascular access used during treatment Catheter  Patient is receiving dialysis in a chair Yes  Hepatitis B Surface Antigen Results Negative  Date Hepatitis B Surface Antigen Drawn 02/26/18  Isolation Initiated Yes  Hepatitis B Surface Antibody  (<10)  Date Hepatitis B Surface Antibody Drawn 02/26/18  Hemodialysis Consent Verified Yes  Hemodialysis Standing Orders Initiated Yes  ECG (Telemetry) Monitor On Yes  Prime Ordered Normal Saline  Length of  DialysisTreatment -hour(s) 3 Hour(s)  Dialyzer Elisio 17H NR  Dialysate 3K, 2.5 Ca  Dialysate Flow Ordered 600  Blood Flow Rate Ordered 400 mL/min  Ultrafiltration Goal 1.5 Liters  Dialysis Blood Pressure Support Ordered Albumin  Education /  Care Plan  Dialysis Education Provided Yes  Documented Education in Care Plan Yes  Hemodialysis Catheter Right Subclavian Double-lumen;Permanent  Placement Date: 03/05/18   Orientation: Right  Access Location: Subclavian  Hemodialysis Catheter Type: Double-lumen;Permanent  Site Condition No complications  Blue Lumen Status Capped (Central line);Blood return noted  Red Lumen Status Capped (Central line);Blood return noted  Purple Lumen Status N/A  Dressing Type Gauze/Drain sponge;Biopatch  Dressing Status Clean;Dry;Intact  Interventions Dressing changed;New dressing  Drainage Description None

## 2018-03-21 NOTE — Progress Notes (Signed)
HD tx end    03/21/18 1518  Vital Signs  Pulse Rate 85  Pulse Rate Source Monitor  Resp 17  BP 94/64  BP Location Left Arm  BP Method Automatic  Patient Position (if appropriate) Sitting  Oxygen Therapy  SpO2 100 %  O2 Device Nasal Cannula  O2 Flow Rate (L/min) 2 L/min  During Hemodialysis Assessment  Dialysis Fluid Bolus Normal Saline  Bolus Amount (mL) 250 mL  Intra-Hemodialysis Comments Tx completed

## 2018-03-21 NOTE — Progress Notes (Signed)
Called respiratory and rapid response due to patient's oxygen saturation.  Patient was 66% on non-rebreather due to poor perfusion and uremia.  Doctor Gouru came to assess patient and ordered an ABG and chest xray.  ABG came back normal and patient given ampicillin.  Rapid response team tubed up a nasal sensor for better oxygen sensory.  Celin, NT was able to get an oxygen reading of 97% on 2L with nasal sensor.  Blood pressure was 102/62.  Patient lethargic but arousable with sternal rub.  Will continue to monitor patient.  Dr. Margaretmary Eddy called son regarding patient's status.  Phoebe Sharps N   03/21/2018  1:14 AM

## 2018-03-21 NOTE — Progress Notes (Signed)
Post HD assessment. Pt tolerated tx well. Pt experienced a drop in BP requiring UF goal to be decreased, MD aware. Net UF 1016, goal met.    03/21/18 1525  Vital Signs  Temp 97.6 F (36.4 C)  Temp Source Axillary  Pulse Rate 72  Pulse Rate Source Monitor  Resp 16  BP 105/86  BP Location Left Arm  BP Method Automatic  Patient Position (if appropriate) Sitting  Oxygen Therapy  SpO2 99 %  O2 Device Nasal Cannula  O2 Flow Rate (L/min) 2 L/min  Dialysis Weight  Weight 77.3 kg  Type of Weight Post-Dialysis  Post-Hemodialysis Assessment  Rinseback Volume (mL) 250 mL  KECN 68.1 V  Dialyzer Clearance Lightly streaked  Duration of HD Treatment -hour(s) 3 hour(s)  Hemodialysis Intake (mL) 500 mL  UF Total -Machine (mL) 1516 mL  Net UF (mL) 1016 mL  Tolerated HD Treatment Yes  Education / Care Plan  Dialysis Education Provided Yes  Documented Education in Care Plan Yes  Hemodialysis Catheter Right Subclavian Double-lumen;Permanent  Placement Date: 03/05/18   Orientation: Right  Access Location: Subclavian  Hemodialysis Catheter Type: Double-lumen;Permanent  Site Condition No complications  Blue Lumen Status Heparin locked  Red Lumen Status Heparin locked  Purple Lumen Status N/A  Catheter fill solution Heparin 1000 units/ml  Catheter fill volume (Arterial) 1.5 cc  Catheter fill volume (Venous) 1.5  Dressing Type Biopatch  Dressing Status Clean;Dry;Intact  Drainage Description None  Post treatment catheter status Capped and Clamped

## 2018-03-22 ENCOUNTER — Encounter: Payer: Self-pay | Admitting: Anesthesiology

## 2018-03-22 ENCOUNTER — Encounter
Admission: RE | Disposition: A | Discharge status: Home Health | Payer: Self-pay | Source: Ambulatory Visit | Attending: Internal Medicine

## 2018-03-22 DIAGNOSIS — L89150 Pressure ulcer of sacral region, unstageable: Secondary | ICD-10-CM

## 2018-03-22 LAB — MAGNESIUM: Magnesium: 2 mg/dL (ref 1.7–2.4)

## 2018-03-22 LAB — BASIC METABOLIC PANEL
Anion gap: 7 (ref 5–15)
BUN: 62 mg/dL — ABNORMAL HIGH (ref 8–23)
CO2: 31 mmol/L (ref 22–32)
CREATININE: 1.71 mg/dL — AB (ref 0.44–1.00)
Calcium: 8.3 mg/dL — ABNORMAL LOW (ref 8.9–10.3)
Chloride: 98 mmol/L (ref 98–111)
GFR calc non Af Amer: 26 mL/min — ABNORMAL LOW (ref >60)
GFR, EST AFRICAN AMERICAN: 30 mL/min — AB (ref >60)
Glucose, Bld: 106 mg/dL — ABNORMAL HIGH (ref 70–99)
Potassium: 4.1 mmol/L (ref 3.5–5.1)
Sodium: 136 mmol/L (ref 135–145)

## 2018-03-22 LAB — GLUCOSE, CAPILLARY
GLUCOSE-CAPILLARY: 77 mg/dL (ref 70–99)
GLUCOSE-CAPILLARY: 80 mg/dL (ref 70–99)
GLUCOSE-CAPILLARY: 104 mg/dL — AB (ref 70–99)
Glucose-Capillary: 105 mg/dL — ABNORMAL HIGH (ref 70–99)
Glucose-Capillary: 108 mg/dL — ABNORMAL HIGH (ref 70–99)
Glucose-Capillary: 43 mg/dL — CL (ref 70–99)
Glucose-Capillary: 90 mg/dL (ref 70–99)

## 2018-03-22 LAB — TYPE AND SCREEN
ABO/RH(D): O POS
Antibody Screen: NEGATIVE

## 2018-03-22 LAB — CBC
HCT: 26.9 % — ABNORMAL LOW (ref 36.0–46.0)
Hemoglobin: 7.8 g/dL — ABNORMAL LOW (ref 12.0–15.0)
MCH: 31.2 pg (ref 26.0–34.0)
MCHC: 29 g/dL — ABNORMAL LOW (ref 30.0–36.0)
MCV: 107.6 fL — ABNORMAL HIGH (ref 80.0–100.0)
Platelets: 127 10*3/uL — ABNORMAL LOW (ref 150–400)
RBC: 2.5 MIL/uL — ABNORMAL LOW (ref 3.87–5.11)
RDW: 20.8 % — ABNORMAL HIGH (ref 11.5–15.5)
WBC: 6.8 10*3/uL (ref 4.0–10.5)
nRBC: 0.6 % — ABNORMAL HIGH (ref 0.0–0.2)

## 2018-03-22 LAB — PROTIME-INR
INR: 1.43
Prothrombin Time: 17.3 seconds — ABNORMAL HIGH (ref 11.4–15.2)

## 2018-03-22 LAB — APTT: APTT: 38 s — AB (ref 24–36)

## 2018-03-22 SURGERY — LAPAROSCOPIC INSERTION CONTINUOUS AMBULATORY PERITONEAL DIALYSIS  (CAPD) CATHETER
Anesthesia: General

## 2018-03-22 MED ORDER — DEXTROSE 50 % IV SOLN
INTRAVENOUS | Status: AC
  Filled 2018-03-22: qty 50

## 2018-03-22 MED ORDER — GLUCAGON HCL RDNA (DIAGNOSTIC) 1 MG IJ SOLR
INTRAMUSCULAR | Status: AC
  Filled 2018-03-22: qty 1

## 2018-03-22 MED ORDER — GLUCAGON HCL RDNA (DIAGNOSTIC) 1 MG IJ SOLR
1.0000 mg | INTRAMUSCULAR | Status: AC
  Administered 2018-03-22: 1 mg via INTRAMUSCULAR

## 2018-03-22 SURGICAL SUPPLY — 37 items
ADAPTER BETA CAP QUINTON DIALY (ADAPTER) IMPLANT
ADAPTER CATH DIALYSIS 18.75 (CATHETERS) ×3 IMPLANT
ADAPTER CATH DIALYSIS 18.75CM (CATHETERS) ×1
ADH SKN CLS APL DERMABOND .7 (GAUZE/BANDAGES/DRESSINGS) ×1
ADPR DLYS BCP STRL PRTNL ULTEM (ADAPTER)
CANISTER SUCT 1200ML W/VALVE (MISCELLANEOUS) ×4 IMPLANT
CATH DLYS SWAN NECK 62.5CM (CATHETERS) ×4 IMPLANT
CHLORAPREP W/TINT 26ML (MISCELLANEOUS) ×4 IMPLANT
COVER WAND RF STERILE (DRAPES) ×4 IMPLANT
DERMABOND ADVANCED (GAUZE/BANDAGES/DRESSINGS) ×2
DERMABOND ADVANCED .7 DNX12 (GAUZE/BANDAGES/DRESSINGS) ×2 IMPLANT
ELECT CAUTERY BLADE 6.4 (BLADE) ×4 IMPLANT
ELECT REM PT RETURN 9FT ADLT (ELECTROSURGICAL) ×3
ELECTRODE REM PT RTRN 9FT ADLT (ELECTROSURGICAL) ×2 IMPLANT
GLOVE BIO SURGEON STRL SZ7 (GLOVE) ×8 IMPLANT
GLOVE INDICATOR 7.5 STRL GRN (GLOVE) ×4 IMPLANT
GOWN STRL REUS W/ TWL LRG LVL3 (GOWN DISPOSABLE) ×2 IMPLANT
GOWN STRL REUS W/ TWL XL LVL3 (GOWN DISPOSABLE) ×4 IMPLANT
GOWN STRL REUS W/TWL LRG LVL3 (GOWN DISPOSABLE) ×3
GOWN STRL REUS W/TWL XL LVL3 (GOWN DISPOSABLE) ×6
IV NS 500ML (IV SOLUTION) ×3
IV NS 500ML BAXH (IV SOLUTION) ×2 IMPLANT
KIT TURNOVER KIT A (KITS) ×4 IMPLANT
LABEL OR SOLS (LABEL) ×4 IMPLANT
MINICAP W/POVIDONE IODINE SOL (MISCELLANEOUS) ×4 IMPLANT
PACK LAP CHOLECYSTECTOMY (MISCELLANEOUS) ×4 IMPLANT
PENCIL ELECTRO HAND CTR (MISCELLANEOUS) ×4 IMPLANT
SET CYSTO W/LG BORE CLAMP LF (SET/KITS/TRAYS/PACK) ×4 IMPLANT
SET TRANSFER 6 W/TWIST CLAMP 5 (SET/KITS/TRAYS/PACK) ×4 IMPLANT
SET TUBE SMOKE EVAC HIGH FLOW (TUBING) ×4 IMPLANT
SPONGE DRAIN TRACH 4X4 STRL 2S (GAUZE/BANDAGES/DRESSINGS) ×4 IMPLANT
SPONGE VERSALON 4X4 4PLY (MISCELLANEOUS) ×4 IMPLANT
SUT MNCRL AB 4-0 PS2 18 (SUTURE) ×4 IMPLANT
SUT VIC AB 2-0 UR6 27 (SUTURE) ×4 IMPLANT
SUT VICRYL+ 3-0 36IN CT-1 (SUTURE) ×4 IMPLANT
TROCAR XCEL NON-BLD 11X100MML (ENDOMECHANICALS) ×4 IMPLANT
TROCAR XCEL NON-BLD 5MMX100MML (ENDOMECHANICALS) IMPLANT

## 2018-03-22 NOTE — Progress Notes (Signed)
TF stopped and PEG flushed with H20; positioned for comfort, HOB elevated 30'; bilateral UE elevated on pillows; IV flushed well; son at bedside. Barbaraann Faster, RN 12:23 AM; 03/22/2018

## 2018-03-22 NOTE — Care Management Important Message (Signed)
Important Message  Patient Details  Name: [name] MRN: [id number] Date of Birth: November 06, 1927   Medicare Important Message Given:  Yes(telephone reviewed with son Briannon Boggio.  Copy left in room, copy sent to HIM)    Beverly Sessions, RN 03/22/2018, 12:03 PM

## 2018-03-22 NOTE — Care Management (Signed)
RNCm called to update son on discharge disposition.  RNCM notified son that per [facility] they will no longer be able to accept the patient for home health. Son had specific questions regarding the denial, and request documentation in writing as to why the case will not be accepted.  Leadership at [facility] was notified and they reached out to the son to answer his questions.   Son is in agreement to purse another home health agency.  Son states that he does not have a preference of agency.    Son wants for [facility] to follow up on:  - reclining WC - has been ordered, and Corene Cornea with  [facility] aware. Per Corene Cornea item is to be  scheduled for delivery at home  - need for o2 at home - son notified that per medicare  guidelines patient would require qualifying saturations  within 48 hours of discharge. If patient does not qualify for  discharge it would be an out of pocket expense   - Son states that prior to discharge there will need to be a  new order for tube feedings at home.  Son states they  have the feeding pump at home, however the feeds that  they have in the home are no longer the correct ones.   Corene Cornea with [facility] is aware   Update: 1605 Per Cassie with [facility] patient is in network and they will accept the referral. RNCM to call an notify son on 2/21.  Will provide CMS Medicare.gov Compare Post Acute Care list at that time

## 2018-03-22 NOTE — Progress Notes (Signed)
Hypoglycemic Event  CBG: 43  Treatment: IM glucagon  Symptoms: lethargy  Follow-up CBG: Time:1732 CBG Result:80  Possible Reasons for Event: was recently NPO Comments/MD notified:    Eliu Batch A Loreen Bankson

## 2018-03-22 NOTE — Care Management (Signed)
Notified by leadership at [facility] that they will no longer be able to accept the patient for home health services at discharge.

## 2018-03-22 NOTE — Progress Notes (Signed)
[facility]  ROUNDING NOTE   Subjective:   Patient remains very somnolent today.  Did not wake up to answer any questions. Continues to have large amount of lower extremity edema PD catheter placement is postponed due to patient not medically optimized  Objective:  Vital signs in last 24 hours:  Temp:  [97.9 F (36.6 C)-98.1 F (36.7 C)] 98.1 F (36.7 C) (02/20 0406) Pulse Rate:  [79-129] 94 (02/20 0406) Resp:  [18-20] 18 (02/20 0600) BP: (101-112)/(51-54) 112/54 (02/20 0406) SpO2:  [95 %-100 %] 100 % (02/20 0406) Weight:  [79.4 kg] 79.4 kg (02/20 0500)  Weight change: 0 kg Filed Weights   03/21/18 1155 03/21/18 1525 03/22/18 0500  Weight: 78.3 kg 77.3 kg 79.4 kg    Intake/Output: I/O last 3 completed shifts: In: 930 [NG/GT:630; IV Piggyback:300] Out: 1016 [Other:1016]   Intake/Output this shift:  Total I/O In: 25.3 [IV Piggyback:25.3] Out: -   Physical Exam: General: NAD, Laying in bed  Head: Normocephalic, atraumatic. Moist oral mucosal membranes  Eyes:  Did not open eyes  Neck: Supple,   Lungs:  Clear to auscultation  Heart:  Irregular rhythm  Abdomen:  Soft, nontender, +G-tube  Extremities:  Generalized anasarca  Neurologic: Lethargic, not following commands.   Skin: No lesions  Access: RIJ permcath    Basic Metabolic Panel: Recent Labs  Lab 03/17/18 0555 03/22/18 0512  NA 136 136  K 4.4 4.1  CL 99 98  CO2 27 31  GLUCOSE 224* 106*  BUN 65* 62*  CREATININE 1.76* 1.71*  CALCIUM 8.1* 8.3*  MG 2.1 2.0  PHOS 2.0*  --     Liver Function Tests: Recent Labs  Lab 03/17/18 0555  AST 87*  ALT 48*  ALKPHOS 168*  BILITOT 1.3*  PROT 5.8*  ALBUMIN 2.1*   No results for input(s): LIPASE, AMYLASE in the last 168 hours. No results for input(s): AMMONIA in the last 168 hours.  CBC: Recent Labs  Lab 03/17/18 0555 03/19/18 0527 03/22/18 0512  WBC 7.2 8.1 6.8  NEUTROABS 5.8 6.5  --   HGB 7.7* 7.7* 7.8*  HCT 27.6* 27.1* 26.9*  MCV  109.1* 109.3* 107.6*  PLT 101* 123* 127*    Cardiac Enzymes: No results for input(s): CKTOTAL, CKMB, CKMBINDEX, TROPONINI in the last 168 hours.  BNP: Invalid input(s): POCBNP  CBG: Recent Labs  Lab 03/21/18 2349 03/22/18 0400 03/22/18 0754 03/22/18 1148 03/22/18 1708  GLUCAP 108* 105* 90 77 55*    Microbiology: Results for orders placed or performed during the hospital encounter of 02/26/18  MRSA PCR Screening     Status: Abnormal   Collection Time: 02/26/18  5:39 PM  Result Value Ref Range Status   MRSA by PCR POSITIVE (A) NEGATIVE Final    Comment:        The GeneXpert MRSA Assay (FDA approved for NASAL specimens only), is one component of a comprehensive MRSA colonization surveillance program. It is not intended to diagnose MRSA infection nor to guide or monitor treatment for MRSA infections. CRITICAL RESULT CALLED TO, READ BACK BY AND VERIFIED WITH:  CALLED TO DANA DUGGINS RN @1920  Performed at [facility], [address] [place], [place] [address]   CULTURE, BLOOD (ROUTINE X 2) w Reflex to ID Panel     Status: None   Collection Time: 03/06/18  9:01 PM  Result Value Ref Range Status   Specimen Description BLOOD LEFT ANTECUBITAL  Final   Special Requests   Final  BOTTLES DRAWN AEROBIC ONLY Blood Culture adequate volume   Culture   Final    NO GROWTH 5 DAYS Performed at [facility], [address] [place], [place] [address]    Report Status 03/11/2018 FINAL  Final  CULTURE, BLOOD (ROUTINE X 2) w Reflex to ID Panel     Status: None   Collection Time: 03/06/18  9:37 PM  Result Value Ref Range Status   Specimen Description BLOOD LEFT ANTECUBITAL  Final   Special Requests   Final    BOTTLES DRAWN AEROBIC AND ANAEROBIC Blood Culture adequate volume   Culture   Final    NO GROWTH 5 DAYS Performed at [facility], [address] [place], [place] [address]    Report Status 03/11/2018 FINAL  Final    Coagulation  Studies: Recent Labs    03/22/18 0512  LABPROT 17.3*  INR 1.43    Urinalysis: No results for input(s): COLORURINE, LABSPEC, PHURINE, GLUCOSEU, HGBUR, BILIRUBINUR, KETONESUR, PROTEINUR, UROBILINOGEN, NITRITE, LEUKOCYTESUR in the last 72 hours.  Invalid input(s): APPERANCEUR    Imaging: Dg Chest 1 View  Result Date: 03/20/2018 CLINICAL DATA:  Pulmonary edema. EXAM: CHEST  1 VIEW COMPARISON:  Radiograph of March 15, 2018. FINDINGS: Stable cardiomegaly. Status post coronary bypass graft. No pneumothorax is noted. Right internal jugular dialysis catheter is unchanged in position. Stable left pleural effusion is noted with associated atelectasis or infiltrate. Minimal right pleural effusion is noted. Bony thorax is unremarkable. IMPRESSION: Stable left pleural effusion is noted with associated atelectasis or infiltrate. Stable smaller right pleural effusion. Electronically Signed   By: Marijo Conception, M.D.   On: 03/20/2018 21:45     Medications:   . albumin human 25 g (03/21/18 1256)  . ampicillin-sulbactam (UNASYN) IV Stopped (03/22/18 1352)  . feeding supplement (OSMOLITE 1.5 CAL) 1,000 mL (03/22/18 1226)   . atorvastatin  40 mg Per Tube q1800  . B-complex with vitamin C  1 tablet Per Tube Daily  . chlorhexidine  15 mL Mouth Rinse BID  . clopidogrel  75 mg Per Tube QHS  . collagenase   Topical Daily  . dextrose      . epoetin (EPOGEN/PROCRIT) injection  10,000 Units Intravenous Q M,W,F-HD  . famotidine  10 mg Per Tube Daily  . feeding supplement (PRO-STAT SUGAR FREE 64)  30 mL Per Tube BID  . free water  20 mL Per Tube Q4H  . glucagon (human recombinant)      . glucagon (human recombinant)  1 mg Intramuscular STAT  . insulin aspart  0-9 Units Subcutaneous Q4H  . mouth rinse  15 mL Mouth Rinse q12n4p  . midodrine  5 mg Oral BID WC  . multivitamin  15 mL Per Tube QHS  . nutrition supplement (JUVEN)  1 packet Per Tube BID BM  . pantoprazole (PROTONIX) IV  40 mg Intravenous  Daily   acetaminophen (TYLENOL) oral liquid 160 mg/5 mL **OR** acetaminophen **OR** acetaminophen, albumin human, albuterol, ipratropium, lip balm, Melatonin, nitroGLYCERIN, ondansetron, polyethylene glycol, promethazine  Assessment/ Plan:  [name] is a 85 y.o. black female with diabetes mellitus type II, hypertension, hyperlipidemia, coronary disease status post CABG, history of stroke, CKD, was admitted on1/27/2020. Originally admitted to [place] hospitalwith Achalasiaand 50 pound weight loss, dysarthria since November 2019. Now transitioned to [facility] on 02/26/18 to initiate hemodialysis. First hemodialysis treatment on 1/28 through [facility].   1. End Stage Renal Disease with generalized edema   No signs of renal  recovery.  - Will Continue MWF schedule - Ultrafiltration has been limited due to low blood pressure. -Sat in the chair for hemodialysis today with [facility] lift assist only Wednesday.  We will try again on Friday -Heart rhythm atrial fibrillation with heart rate varying from 80-120 during treatment on Wednesday - Family is desiring peritoneal dialysis on discharge.  PD catheter placement re-scheduled for Thursday of next week once medically optimized -Training for PD expected to start on the following Monday  Discussed patient situation with her son today, Dr. Mayer Masker.  We discussed about postponement of PD catheter placement. It is now anticipated to be placed next Thursday if patient is medically optimized.  We also discussed continuing plan for hemodialysis as outpatient in the meantime.  Patient will dialyze in chair tomorrow and next week on MWF schedule.  Anticipated discharge next Friday.  If patient is not medically ready for PD catheter placement by Thursday, it can be considered to be done outpatient or patient can continue outpatient hemodialysis.  [name] will arrange for transport to the dialysis center.  We also discussed that outpatient PD training  requires 8 to 10 days with patient physically present for training.  She will have to be able to sit in a recliner chair for at least 6 hours at a time.  Discussed case with case manager on the medical floor as well as patient pathways.  They will request outpatient dialysis slot at one of the local units.  We have had discussion with patient's son regarding high risk of PD catheter placement surgery in a malnourished patient and additional risk of peritonitis in this patient who has a PEG tube.  Patient son has indicated that he would like to continue palliative peritoneal dialysis at home if possible.  2. Hypotension: - midodrine with hemodialysis.   3. Anemia of chronic kidney disease:  Macrocytic. Hemoglobin 7.7 macrocytic. with thrombocytopenia.  - EPO with HD treatment -Avoiding IV iron due to concurrent treatment for infection/aspiration pneumonia  4. Malnutrition with achalasia: G tube in place   Albumin remains low at 2.1 - IV albumin with HD treatment for oncotic support  5.  Aspiration pneumonia Treated with IV Unasyn     LOS: 24 Jeanenne Licea 2/20/20205:27 PM

## 2018-03-22 NOTE — Progress Notes (Signed)
[facility] [place] NAME: [name]    MR#:  [id number]  DATE OF BIRTH:  December 08, 1927  SUBJECTIVE:  CHIEF COMPLAINT:  No chief complaint on file.  Moaning. Tolerating tube feeds Hypoxic event last night-improved on oxygen, started antibiotics, x-ray does not show any new findings. Oxygen sats are stable.   REVIEW OF SYSTEMS:  Review of Systems  Unable to perform ROS: Mental status change   DRUG ALLERGIES:   Allergies  Allergen Reactions  . Reglan [Metoclopramide]     Pt's son reports this causes patient tremors and neurologic issues   . Contrast Media [Iodinated Diagnostic Agents]     Sensitivity, kidney issues    VITALS:  Blood pressure (!) 113/95, pulse (!) 104, temperature 98.4 F (36.9 C), temperature source Oral, resp. rate 17, height 5\' 3"  (1.6 m), weight 79.4 kg, SpO2 93 %.  PHYSICAL EXAMINATION:  Physical Exam  GENERAL:  85 y.o.-year-old elderly patient lying in the bed with no acute distress.  EYES: Pupils equal, round, reactive to light . No scleral icterus.  HEENT: Head atraumatic, normocephalic.  NECK:  Supple, no jugular venous distention. No thyroid enlargement, no tenderness.  LUNGS: Bilateral rhonchi Right chest permacath present CARDIOVASCULAR: S1, S2 normal. No  rubs, or gallops.  ABDOMEN: Soft, nontender, nondistended. Bowel sounds present. No organomegaly or mass.  PEG tube in place EXTREMITIES: 2+ bilateral pedal edema noted.   bilateral hand edema .  No cyanosis, or clubbing.  NEUROLOGIC: Not following commands today.  Appears globally very weak. Not following instructions PSYCHIATRIC: The patient is drowsy but arousable and moaning. SKIN:  Has a stage II pressure ulcer in the sacral area.   LABORATORY PANEL:   CBC Recent Labs  Lab 03/22/18 0512  WBC 6.8  HGB 7.8*  HCT 26.9*  PLT 127*    ------------------------------------------------------------------------------------------------------------------  Chemistries  Recent Labs  Lab 03/17/18 0555 03/22/18 0512  NA 136 136  K 4.4 4.1  CL 99 98  CO2 27 31  GLUCOSE 224* 106*  BUN 65* 62*  CREATININE 1.76* 1.71*  CALCIUM 8.1* 8.3*  MG 2.1 2.0  AST 87*  --   ALT 48*  --   ALKPHOS 168*  --   BILITOT 1.3*  --    ------------------------------------------------------------------------------------------------------------------  Cardiac Enzymes No results for input(s): TROPONINI in the last 168 hours. ------------------------------------------------------------------------------------------------------------------  RADIOLOGY:  Dg Chest 1 View  Result Date: 03/20/2018 CLINICAL DATA:  Pulmonary edema. EXAM: CHEST  1 VIEW COMPARISON:  Radiograph of March 15, 2018. FINDINGS: Stable cardiomegaly. Status post coronary bypass graft. No pneumothorax is noted. Right internal jugular dialysis catheter is unchanged in position. Stable left pleural effusion is noted with associated atelectasis or infiltrate. Minimal right pleural effusion is noted. Bony thorax is unremarkable. IMPRESSION: Stable left pleural effusion is noted with associated atelectasis or infiltrate. Stable smaller right pleural effusion. Electronically Signed   By: Marijo Conception, M.D.   On: 03/20/2018 21:45    EKG:   Orders placed or performed during the hospital encounter of 02/26/18  . EKG 12-Lead  . EKG 12-Lead  . EKG 12-Lead  . EKG 12-Lead    ASSESSMENT AND PLAN:   [name] is an19 y.o.femalewith multiple comorbidities including diabetes type 2, hypertension, hyperlipidemia, coronary disease status post CABG, history of stroke, and CKD who was admitted on1/27/2020. Originally admitted to [place] hospitalwithdysphagia and achalasiaand 50 pound weight loss in 12/2017.Status post treatment for ESBL Klebsiella aspiration pneumonia,  enterococcus UTI. Admitted for dialysis.  1.  Acute renal failure on CKD stage III- admitted with uremia and encephalopathy Progressed to end-stage renal disease - on hemodialysis.. Working on setting up outpatient dialysis.  Patient has a right chest permacath - Receiving IV albumin during dialysis as needed - Vascular has been consulted for possible PD catheter placement for early next week - Plan was to do PD catheter today, but anesthesia canceled - as per them pt is not optimized enough.  2. Acute on chronic combined heart failure-echo with moderate mitral regurgitation and bilateral  pleural effusion.  Presented with anasarca and hypoalbuminemia - Volume overload persists.  Should improve with dialysis hopefully - Patient has finished her antibiotics for aspiration pneumonia. - Continue oxygen. - 03/20/18 night, she had event of hypoxia, so started on antibiotic for aspiration again, I will give 5 days course.  Her oxygen saturation is stable now.   3. Dysphagia secondary to achalasia- s/p Botox injections but causing pharyngeal muscle weakness. - Currently status post PEG tube and tube feeds.  4.  Diabetes mellitus-appreciate diabetes coordinator's input. On SSI.  5.  Left temporal and frontal CVA-on Plavix and statin  6.  Anemia-likely anemia of chronic disease . -transfusion if hemoglobin is less than 7.  Continue EPO with dialysis.  7.  Acute encephalopathy-initially was in uremic encephalopathy with decreased mentation.  Now  confused.  Underlying delirium .  Continue to monitor. - needing phenergan PRN prior to dialysis as she is still screaming during dialysis - Family refusing haldol. - Pt does have changes due to Stroke. - this might be new baseline, I am not expecting much meaningful changes.  Physical therapy consulted- patient was able to sit in a chair   Poor prognosis.  Continue treatment as requested by family.  High risk for deterioration. Her oxygen sats  stable, she is in optimal condition to proceed for procedures, I do not expect much meaningful improvement in condition from this.  All the records are reviewed and case discussed with Care Management/Social Worker Management plans discussed with the patient, family and they are in agreement.  CODE STATUS: Full code  TOTAL TIME TAKING CARE OF THIS PATIENT: 25 minutes.   Vaughan Basta M.D on 03/22/2018 at 7:15 PM  Between 7am to 6pm - Pager - [phone]  After 6pm go to [email or web address] - password [email or web address] [facility]  Office  [phone]  CC: Primary care physician; Deland Pretty, MD

## 2018-03-22 NOTE — Progress Notes (Signed)
Discussed case with anesthesia Patient not optimized for surgery and not a candidate for anesthesia today They would like full treatment for aspiration pneumonia, improved fluid status, and better MS. Hope to optimize for next week at this point.

## 2018-03-23 LAB — RENAL FUNCTION PANEL
ALBUMIN: 2.3 g/dL — AB (ref 3.5–5.0)
Anion gap: 7 (ref 5–15)
BUN: 66 mg/dL — ABNORMAL HIGH (ref 8–23)
CO2: 31 mmol/L (ref 22–32)
Calcium: 8.4 mg/dL — ABNORMAL LOW (ref 8.9–10.3)
Chloride: 98 mmol/L (ref 98–111)
Creatinine, Ser: 1.92 mg/dL — ABNORMAL HIGH (ref 0.44–1.00)
GFR calc Af Amer: 26 mL/min — ABNORMAL LOW (ref >60)
GFR calc non Af Amer: 23 mL/min — ABNORMAL LOW (ref >60)
GLUCOSE: 293 mg/dL — AB (ref 70–99)
PHOSPHORUS: 2.1 mg/dL — AB (ref 2.5–4.6)
Potassium: 4.4 mmol/L (ref 3.5–5.1)
Sodium: 136 mmol/L (ref 135–145)

## 2018-03-23 LAB — GLUCOSE, CAPILLARY
Glucose-Capillary: 118 mg/dL — ABNORMAL HIGH (ref 70–99)
Glucose-Capillary: 145 mg/dL — ABNORMAL HIGH (ref 70–99)
Glucose-Capillary: 213 mg/dL — ABNORMAL HIGH (ref 70–99)
Glucose-Capillary: 218 mg/dL — ABNORMAL HIGH (ref 70–99)
Glucose-Capillary: 77 mg/dL (ref 70–99)

## 2018-03-23 MED ORDER — SODIUM CHLORIDE 0.9 % IV BOLUS
250.0000 mL | Freq: Once | INTRAVENOUS | Status: AC
  Administered 2018-03-23: 250 mL via INTRAVENOUS

## 2018-03-23 MED ORDER — METOPROLOL TARTRATE 5 MG/5ML IV SOLN
5.0000 mg | Freq: Four times a day (QID) | INTRAVENOUS | Status: DC | PRN
  Filled 2018-03-23: qty 5

## 2018-03-23 MED ORDER — SODIUM CHLORIDE 0.9 % IV SOLN
INTRAVENOUS | Status: DC | PRN
  Administered 2018-03-23: via INTRAVENOUS
  Administered 2018-03-25: 10 mL/h via INTRAVENOUS
  Administered 2018-03-29: 15:00:00 via INTRAVENOUS

## 2018-03-23 MED ORDER — HEPARIN SODIUM (PORCINE) 5000 UNIT/ML IJ SOLN
5000.0000 [IU] | Freq: Three times a day (TID) | INTRAMUSCULAR | Status: DC
  Administered 2018-03-23 – 2018-03-24 (×4): 5000 [IU] via SUBCUTANEOUS
  Filled 2018-03-23 (×5): qty 1

## 2018-03-23 NOTE — Progress Notes (Signed)
HD tx end    03/23/18 1350  Vital Signs  Pulse Rate 87  Pulse Rate Source Monitor  Resp 15  BP 127/78  BP Location Right Arm  BP Method Automatic  Patient Position (if appropriate) Sitting  Oxygen Therapy  SpO2 99 %  O2 Device Nasal Cannula  O2 Flow Rate (L/min) 2 L/min  During Hemodialysis Assessment  Dialysis Fluid Bolus Normal Saline  Bolus Amount (mL) 250 mL  Intra-Hemodialysis Comments Tx completed

## 2018-03-23 NOTE — Progress Notes (Signed)
This RN spoke with Dr. Jannifer Franklin regarding low blood pressures, patient's fingers and toes purple and cyanotic. ICU charge nurse present  Along with Supervisor jackie. New orders to transfer to stepdown and give a 250 ml bolus of normal saline. Terrial Rhodes

## 2018-03-23 NOTE — Progress Notes (Signed)
[facility] NAME: Audrinna Sherman    MR#:  [id number]  DATE OF BIRTH:  July 02, 1927  SUBJECTIVE:  CHIEF COMPLAINT:  No chief complaint on file.  Moaning. Tolerating tube feeds  x-ray does not show any new findings. Oxygen sats are stable.  Patient is more awake today.  REVIEW OF SYSTEMS:  Review of Systems  Unable to perform ROS: Mental status change   DRUG ALLERGIES:   Allergies  Allergen Reactions  . Reglan [Metoclopramide]     Pt's son reports this causes patient tremors and neurologic issues   . Contrast Media [Iodinated Diagnostic Agents]     Sensitivity, kidney issues    VITALS:  Blood pressure 104/79, pulse 79, temperature (!) 97.3 F (36.3 C), temperature source Axillary, resp. rate (!) 22, height 5\' 3"  (1.6 m), weight 76.4 kg, SpO2 98 %.  PHYSICAL EXAMINATION:  Physical Exam  GENERAL:  85 y.o.-year-old elderly patient lying in the bed with no acute distress.  EYES: Pupils equal, round, reactive to light . No scleral icterus.  HEENT: Head atraumatic, normocephalic.  NECK:  Supple, no jugular venous distention. No thyroid enlargement, no tenderness.  LUNGS: Bilateral rhonchi Right chest permacath present CARDIOVASCULAR: S1, S2 normal. No  rubs, or gallops.  ABDOMEN: Soft, nontender, nondistended. Bowel sounds present. No organomegaly or mass.  PEG tube in place EXTREMITIES: 2+ bilateral pedal edema noted.   bilateral hand edema .  No cyanosis, or clubbing.  NEUROLOGIC: Not following commands today.  Appears globally very weak. Not following instructions PSYCHIATRIC: The patient is awake and answering some 1 or 2 words. SKIN:  Has a stage II pressure ulcer in the sacral area.   LABORATORY PANEL:   CBC Recent Labs  Lab 03/22/18 0512  WBC 6.8  HGB 7.8*  HCT 26.9*  PLT 127*   ------------------------------------------------------------------------------------------------------------------  Chemistries    Recent Labs  Lab 03/17/18 0555 03/22/18 0512 03/23/18 1052  NA 136 136 136  K 4.4 4.1 4.4  CL 99 98 98  CO2 27 31 31   GLUCOSE 224* 106* 293*  BUN 65* 62* 66*  CREATININE 1.76* 1.71* 1.92*  CALCIUM 8.1* 8.3* 8.4*  MG 2.1 2.0  --   AST 87*  --   --   ALT 48*  --   --   ALKPHOS 168*  --   --   BILITOT 1.3*  --   --    ------------------------------------------------------------------------------------------------------------------  Cardiac Enzymes No results for input(s): TROPONINI in the last 168 hours. ------------------------------------------------------------------------------------------------------------------  RADIOLOGY:  No results found.  EKG:   Orders placed or performed during the hospital encounter of 02/26/18  . EKG 12-Lead  . EKG 12-Lead  . EKG 12-Lead  . EKG 12-Lead    ASSESSMENT AND PLAN:   Kourtnie Sachs is an48 y.o.femalewith multiple comorbidities including diabetes type 2, hypertension, hyperlipidemia, coronary disease status post CABG, history of stroke, and CKD who was admitted on1/27/2020. [facility] [place] and achalasiaand 50 pound weight loss in 12/2017.Status post treatment for ESBL Klebsiella aspiration pneumonia, enterococcus UTI. Admitted for dialysis.  1.  Acute renal failure on CKD stage III- admitted with uremia and encephalopathy Progressed to end-stage renal disease - on hemodialysis.. Working on setting up outpatient dialysis.  Patient has a right chest permacath - Receiving IV albumin during dialysis as needed - Vascular has been consulted for possible PD catheter placement for early next week - Plan was to place PD catheter, but  anesthesia canceled - as per them pt is not optimized enough. -Trial again next week to place PD catheter.  2. Acute on chronic combined heart failure-echo with moderate mitral regurgitation and bilateral  pleural effusion.  Presented with anasarca and  hypoalbuminemia - Volume overload persists.  Should improve with dialysis hopefully - Patient has finished her antibiotics for aspiration pneumonia. - Continue oxygen. - 03/20/18 night, she had event of hypoxia, so started on antibiotic for aspiration again, I will give 5 days course.  Her oxygen saturation is stable now.   3. Dysphagia secondary to achalasia- s/p Botox injections but causing pharyngeal muscle weakness. - Currently status post PEG tube and tube feeds.  4.  Diabetes mellitus-appreciate diabetes coordinator's input. On SSI.  5.  Left temporal and frontal CVA-on Plavix and statin  6.  Anemia-likely anemia of chronic disease . -transfusion if hemoglobin is less than 7. Continue EPO with dialysis.  7.  Acute encephalopathy-initially was in uremic encephalopathy with decreased mentation.  Now  confused.  Underlying delirium .  Continue to monitor. - needing phenergan PRN prior to dialysis as she is still screaming during dialysis - Family refusing haldol. - Pt does have changes due to Stroke.  Physical therapy consulted- patient was able to sit in a chair   Poor prognosis.  Continue treatment as requested by family.  High risk for deterioration. Her oxygen sats stable,.  Mental status fluctuates due to baseline medical issues.  All the records are reviewed and case discussed with Care Management/Social Worker Management plans discussed with the patient, family and they are in agreement.  CODE STATUS: Full code  TOTAL TIME TAKING CARE OF THIS PATIENT: 25 minutes.   Vaughan Basta M.D on 03/23/2018 at 6:00 PM  Between 7am to 6pm - Pager - [phone]  After 6pm go to [email or web address] - password [email or web address] [facility]  Office  [phone]  CC: Primary care physician; Deland Pretty, MD

## 2018-03-23 NOTE — Progress Notes (Signed)
This RN spoke with Dr.Pyreddy via phone about patient"s heart rate in the 140's the instantly going to low 100's. Dr. Estanislado Pandy also made aware of patient in Afib. New order for metoprolol of 5 mg every 6 hrs as needed for heart rate greater than 100. Terrial Rhodes

## 2018-03-23 NOTE — Progress Notes (Signed)
[facility]  ROUNDING NOTE   Subjective:   Patient seen during dialysis.  Tolerating fair.  Sitting up in the chair.  This morning she is the most awake we have seen her.  She is asking for water.  Replied "fine" when asked by 1 of the staff members that how she was doing today.  Continues to have large amount of generalized edema   HEMODIALYSIS FLOWSHEET:  Blood Flow Rate (mL/min): 400 mL/min Arterial Pressure (mmHg): -180 mmHg Venous Pressure (mmHg): 140 mmHg Transmembrane Pressure (mmHg): 50 mmHg Ultrafiltration Rate (mL/min): 830 mL/min Dialysate Flow Rate (mL/min): 600 ml/min Conductivity: Machine : 14 Conductivity: Machine : 14 Dialysis Fluid Bolus: Normal Saline Bolus Amount (mL): 250 mL Dialysate Change: (MD at bedside, 3K bath)    Objective:  Vital signs in last 24 hours:  Temp:  [97.1 F (36.2 C)-98.4 F (36.9 C)] 97.1 F (36.2 C) (02/21 1004) Pulse Rate:  [51-104] 97 (02/21 1315) Resp:  [11-23] 13 (02/21 1315) BP: (78-128)/(53-107) 102/69 (02/21 1315) SpO2:  [93 %-100 %] 99 % (02/21 1315) Weight:  [78.4 kg-78.5 kg] 78.4 kg (02/21 1004)  Weight change: 0.172 kg Filed Weights   03/22/18 0500 03/23/18 0414 03/23/18 1004  Weight: 79.4 kg 78.5 kg 78.4 kg    Intake/Output: I/O last 3 completed shifts: In: 1485.3 [NG/GT:1160; IV Piggyback:325.3] Out: 0    Intake/Output this shift:  No intake/output data recorded.  Physical Exam: General: NAD, sitting up in dialysis chair  Head: Normocephalic, atraumatic. Moist oral mucosal membranes  Eyes:  Anicteric, eyes are open, looking around  Neck: Supple,   Lungs:  Clear to auscultation  Heart:  Irregular rhythm  Abdomen:  Soft, nontender, +G-tube  Extremities:  Generalized anasarca  Neurologic:  Alert, making grunting sounds, able to answer 1 or 2 few simple questions  Skin: No lesions  Access: RIJ permcath    Basic Metabolic Panel: Recent Labs  Lab 03/17/18 0555 03/22/18 0512 03/23/18 1052   NA 136 136 136  K 4.4 4.1 4.4  CL 99 98 98  CO2 27 31 31   GLUCOSE 224* 106* 293*  BUN 65* 62* 66*  CREATININE 1.76* 1.71* 1.92*  CALCIUM 8.1* 8.3* 8.4*  MG 2.1 2.0  --   PHOS 2.0*  --  2.1*    Liver Function Tests: Recent Labs  Lab 03/17/18 0555 03/23/18 1052  AST 87*  --   ALT 48*  --   ALKPHOS 168*  --   BILITOT 1.3*  --   PROT 5.8*  --   ALBUMIN 2.1* 2.3*   No results for input(s): LIPASE, AMYLASE in the last 168 hours. No results for input(s): AMMONIA in the last 168 hours.  CBC: Recent Labs  Lab 03/17/18 0555 03/19/18 0527 03/22/18 0512  WBC 7.2 8.1 6.8  NEUTROABS 5.8 6.5  --   HGB 7.7* 7.7* 7.8*  HCT 27.6* 27.1* 26.9*  MCV 109.1* 109.3* 107.6*  PLT 101* 123* 127*    Cardiac Enzymes: No results for input(s): CKTOTAL, CKMB, CKMBINDEX, TROPONINI in the last 168 hours.  BNP: Invalid input(s): POCBNP  CBG: Recent Labs  Lab 03/22/18 1732 03/22/18 1945 03/23/18 0002 03/23/18 0409 03/23/18 0733  GLUCAP 80 104* 118* 10 218*    Microbiology: Results for orders placed or performed during the hospital encounter of 02/26/18  MRSA PCR Screening     Status: Abnormal   Collection Time: 02/26/18  5:39 PM  Result Value Ref Range Status   MRSA by PCR POSITIVE (A) NEGATIVE  Final    Comment:        The GeneXpert MRSA Assay (FDA approved for NASAL specimens only), is one component of a comprehensive MRSA colonization surveillance program. It is not intended to diagnose MRSA infection nor to guide or monitor treatment for MRSA infections. CRITICAL RESULT CALLED TO, READ BACK BY AND VERIFIED WITH:  CALLED TO DANA DUGGINS RN @1920  Performed at [facility], [address] [place], [place] [address]   CULTURE, BLOOD (ROUTINE X 2) w Reflex to ID Panel     Status: None   Collection Time: 03/06/18  9:01 PM  Result Value Ref Range Status   Specimen Description BLOOD LEFT ANTECUBITAL  Final   Special Requests   Final    BOTTLES DRAWN AEROBIC ONLY  Blood Culture adequate volume   Culture   Final    NO GROWTH 5 DAYS Performed at [facility], [address] [place],  [address]    Report Status 03/11/2018 FINAL  Final  CULTURE, BLOOD (ROUTINE X 2) w Reflex to ID Panel     Status: None   Collection Time: 03/06/18  9:37 PM  Result Value Ref Range Status   Specimen Description BLOOD LEFT ANTECUBITAL  Final   Special Requests   Final    BOTTLES DRAWN AEROBIC AND ANAEROBIC Blood Culture adequate volume   Culture   Final    NO GROWTH 5 DAYS Performed at [facility], [address] [place],  [address]    Report Status 03/11/2018 FINAL  Final    Coagulation Studies: Recent Labs    03/22/18 0512  LABPROT 17.3*  INR 1.43    Urinalysis: No results for input(s): COLORURINE, LABSPEC, PHURINE, GLUCOSEU, HGBUR, BILIRUBINUR, KETONESUR, PROTEINUR, UROBILINOGEN, NITRITE, LEUKOCYTESUR in the last 72 hours.  Invalid input(s): APPERANCEUR    Imaging: No results found.   Medications:   . albumin human 25 g (03/21/18 1256)  . ampicillin-sulbactam (UNASYN) IV 3 g (03/22/18 2330)  . feeding supplement (OSMOLITE 1.5 CAL) 1,000 mL (03/22/18 1226)   . atorvastatin  40 mg Per Tube q1800  . B-complex with vitamin C  1 tablet Per Tube Daily  . chlorhexidine  15 mL Mouth Rinse BID  . clopidogrel  75 mg Per Tube QHS  . collagenase   Topical Daily  . epoetin (EPOGEN/PROCRIT) injection  10,000 Units Intravenous Q M,W,F-HD  . famotidine  10 mg Per Tube Daily  . feeding supplement (PRO-STAT SUGAR FREE 64)  30 mL Per Tube BID  . free water  20 mL Per Tube Q4H  . insulin aspart  0-9 Units Subcutaneous Q4H  . mouth rinse  15 mL Mouth Rinse q12n4p  . midodrine  5 mg Oral BID WC  . multivitamin  15 mL Per Tube QHS  . nutrition supplement (JUVEN)  1 packet Per Tube BID BM  . pantoprazole (PROTONIX) IV  40 mg Intravenous Daily   acetaminophen (TYLENOL) oral liquid 160 mg/5 mL **OR** acetaminophen **OR**  acetaminophen, albumin human, albuterol, ipratropium, lip balm, Melatonin, nitroGLYCERIN, ondansetron, polyethylene glycol, promethazine  Assessment/ Plan:  [name] is a 85 y.o. black female with diabetes mellitus type II, hypertension, hyperlipidemia, coronary disease status post CABG, history of stroke, CKD, was admitted on1/27/2020. [facility] [place] [facility] Ach[facility]and 50 pound weight loss, dysarthria since November 2019. Now transitioned to [facility] on 02/26/18 to initiate hemodialysis. [facility] hemod[facility] treatment on 1/28 [facility].   1. End Stage Renal Disease with generalized edema  No signs of renal recovery.  - Will Continue MWF schedule - Ultrafiltration has been limited due to low blood pressure. -Sat in the chair for hemodialysis today -Heart rhythm atrial fibrillation - Family is desiring peritoneal dialysis on discharge.  PD catheter placement re-scheduled for Thursday of next week once medically optimized.  If PD catheter is not able to be placed, still may be discharged with hemodialysis.  Outpatient discharge planning is in progress. -If PD catheter is able to be placed, training for PD expected to start on the following Monday  2. Hypotension: - midodrine with hemodialysis.   3. Anemia of chronic kidney disease:  Macrocytic. Hemoglobin 7.8 macrocytic. with thrombocytopenia.  - EPO with HD treatment -Avoiding IV iron due to concurrent treatment for infection/aspiration pneumonia  4. Malnutrition with achalasia: G tube in place   Albumin remains low at 2.1 - IV albumin with HD treatment for oncotic support.  Not given today  5.  Aspiration pneumonia Treated with IV Unasyn  6.  Cardiac history.  Atrial fibrillation.  CABG times 05/2009, coronary angioplasty and stent 2004.  Severe chronic systolic CHF.  LVEF 25%.  Right ventricular systolic pressure 53 mm.  From echo February 2020     LOS: [place] 2/21/20201:29  PM

## 2018-03-23 NOTE — Progress Notes (Signed)
HD tx start    03/23/18 1011  Vital Signs  Pulse Rate 87  Pulse Rate Source Monitor  Resp 12  BP 105/68  BP Location Right Arm  BP Method Automatic  Patient Position (if appropriate) Sitting  Oxygen Therapy  SpO2 100 %  O2 Device Nasal Cannula  O2 Flow Rate (L/min) 2 L/min  During Hemodialysis Assessment  Blood Flow Rate (mL/min) 400 mL/min  Arterial Pressure (mmHg) -160 mmHg  Venous Pressure (mmHg) 130 mmHg  Transmembrane Pressure (mmHg) 50 mmHg  Ultrafiltration Rate (mL/min) 670 mL/min  Dialysate Flow Rate (mL/min) 600 ml/min  Conductivity: Machine  14  HD Safety Checks Performed Yes  Dialysis Fluid Bolus Normal Saline  Bolus Amount (mL) 250 mL  Intra-Hemodialysis Comments Tx initiated  Hemodialysis Catheter Right Subclavian Double-lumen;Permanent  Placement Date: 03/05/18   Orientation: Right  Access Location: Subclavian  Hemodialysis Catheter Type: Double-lumen;Permanent  Blue Lumen Status Infusing  Red Lumen Status Infusing

## 2018-03-23 NOTE — Progress Notes (Signed)
Post HD assessment. Pt tolerated tx well without c/o or complication. Net UF 2028, goal met.   03/23/18 1356  Vital Signs  Temp (!) 97.3 F (36.3 C)  Temp Source Axillary  Pulse Rate 90  Pulse Rate Source Monitor  Resp 15  BP (!) 136/93  BP Location Right Arm  BP Method Automatic  Patient Position (if appropriate) Sitting  Oxygen Therapy  SpO2 98 %  O2 Device Nasal Cannula  O2 Flow Rate (L/min) 2 L/min  Dialysis Weight  Weight 76.4 kg  Type of Weight Post-Dialysis  Post-Hemodialysis Assessment  Rinseback Volume (mL) 250 mL  KECN 70 V  Dialyzer Clearance Lightly streaked  Duration of HD Treatment -hour(s) 3.5 hour(s)  Hemodialysis Intake (mL) 500 mL  UF Total -Machine (mL) 2528 mL  Net UF (mL) 2028 mL  Tolerated HD Treatment Yes  Education / Care Plan  Dialysis Education Provided Yes  Documented Education in Care Plan Yes  Hemodialysis Catheter Right Subclavian Double-lumen;Permanent  Placement Date: 03/05/18   Orientation: Right  Access Location: Subclavian  Hemodialysis Catheter Type: Double-lumen;Permanent  Site Condition No complications  Blue Lumen Status Heparin locked  Red Lumen Status Heparin locked  Purple Lumen Status N/A  Catheter fill solution Heparin 1000 units/ml  Catheter fill volume (Arterial) 1.5 cc  Catheter fill volume (Venous) 1.5  Dressing Type Biopatch  Dressing Status Clean;Dry;Intact  Drainage Description None  Post treatment catheter status Capped and Clamped

## 2018-03-23 NOTE — Progress Notes (Signed)
Pre HD assessment    03/23/18 1005  Neurological  Level of Consciousness Alert  Orientation Level Disoriented X4  Respiratory  Respiratory Pattern Regular;Unlabored  Chest Assessment Chest expansion symmetrical  Cardiac  Pulse Irregular  ECG Monitor Yes  Cardiac Rhythm Atrial fibrillation  Vascular  R Radial Pulse +1  L Radial Pulse +1  Integumentary  Integumentary (WDL) X  Skin Color Appropriate for ethnicity  Musculoskeletal  Musculoskeletal (WDL) X  Generalized Weakness Yes  Assistive Device None  GU Assessment  Genitourinary (WDL) X  Genitourinary Symptoms  (HD)  Psychosocial  Psychosocial (WDL) X  Patient Behaviors Not interactive  Needs Expressed Physical  Emotional support given Given to patient

## 2018-03-23 NOTE — Progress Notes (Signed)
This RN spoke with Son and daughter via phone when they called to check on patient. Heart rate was elevated at 142 but instantly when back down in the low 100's. Questions asked and answered to family"s satisfaction. [name]

## 2018-03-23 NOTE — Progress Notes (Signed)
Pharmacy Antibiotic Note  [name] is a 85 y.o. female admitted on 02/26/2018 with aspiration PNA .  Pharmacy has been consulted for Unasyn dosing.  Plan: Continue Unasyn 3 gm IV Q12H    CrCl = 21.6 ml/min  Height: 5\' 3"  (160 cm) Weight: 168 lb 6.9 oz (76.4 kg) IBW/kg (Calculated) : 52.4  Temp (24hrs), Avg:97.6 F (36.4 C), Min:97.1 F (36.2 C), Max:98.4 F (36.9 C)  Recent Labs  Lab 03/17/18 0555 03/19/18 0527 03/22/18 0512 03/23/18 1052  WBC 7.2 8.1 6.8  --   CREATININE 1.76*  --  1.71* 1.92*    Estimated Creatinine Clearance: 19.8 mL/min (A) (by C-G formula based on SCr of 1.92 mg/dL (H)).    Allergies  Allergen Reactions  . Reglan [Metoclopramide]     Pt's son reports this causes patient tremors and neurologic issues   . Contrast Media [Iodinated Diagnostic Agents]     Sensitivity, kidney issues    Antimicrobials this admission:  Unasyn 2/05 >> 2/07  Unasyn 2/19 >>   Dose adjustments this admission: None  Microbiology results:   MRSA PCR: Positive  Thank you for allowing pharmacy to be a part of this patient's care.  Lu Duffel, PharmD, BCPS Clinical Pharmacist 03/23/2018 2:11 PM

## 2018-03-23 NOTE — Progress Notes (Signed)
Had orders to transfer patient to SDU. Called [phone]) Dr. Mayer Masker to update him on change in patient's status and move to SDU.Explained as RN was doing patient/ care assessment, she noted patient's fingers to be purple, blood pressure difficult to be obtained. When obtained in all 3 extremities noted BP to be low. He asked about how much fluid had been pulled during dialysis, then said he thought it was 2 liters. He thought she may be dry. Explained we were giving her a 250 bolus. He confirmed that usually helps after she has dialysis. He asked about her respiratory status. Reported her sats were 90's and did not look like increased work of breathing. Lungs were clear. He said she usually went into afib when she was dry. He stated he would be here in about 20 minutes. Gave him directions to ICU.Son sounded calm about news.

## 2018-03-23 NOTE — Progress Notes (Signed)
PT Cancellation Note  Patient Details Name: [name] MRN: [id number] DOB: 1927-04-13   Cancelled Treatment:    Reason Eval/Treat Not Completed: Patient at procedure or test/unavailable(Patient currently off unit for dialysis.  will re-attempt at later time/date as medically appropriate and available.)   Evva Din H. Owens Shark, PT, DPT, NCS 03/23/18, 10:06 AM [phone]

## 2018-03-23 NOTE — Progress Notes (Signed)
Pre HD assessment   03/23/18 1004  Vital Signs  Temp (!) 97.1 F (36.2 C)  Temp Source Axillary  Pulse Rate 87  Pulse Rate Source Monitor  Resp (!) 23  BP 115/88  BP Location Right Arm  BP Method Automatic  Patient Position (if appropriate) Sitting  Oxygen Therapy  SpO2 100 %  O2 Device Nasal Cannula  O2 Flow Rate (L/min) 2 L/min  Pain Assessment  Pain Scale PAINAD  PAINAD (Pain Assessment in Advanced Dementia)  Breathing 0  Negative Vocalization 1  Facial Expression 0  Body Language 0  Consolability 0  PAINAD Score 1  Dialysis Weight  Weight 78.4 kg  Type of Weight Pre-Dialysis  Time-Out for Hemodialysis  What Procedure? HD  Pt Identifiers(min of two) First/Last Name;MRN/Account#  Correct Site? Yes  Correct Side? Yes  Correct Procedure? Yes  Consents Verified? Yes  Rad Studies Available? N/A  Safety Precautions Reviewed? Yes  Engineer, civil (consulting) Number  ([id number])  Station Number 4  UF/Alarm Test Passed  Conductivity: Meter 13.6  Conductivity: Machine  13.9  pH 7.6  Reverse Osmosis main  Normal Saline Lot Number [id number]  Dialyzer Lot Number [id number]  Disposable Set Lot Number [id number]  Machine Temperature 98.6 F (37 C)  Musician and Audible Yes  Blood Lines Intact and Secured Yes  Pre Treatment Patient Checks  Vascular access used during treatment Catheter  Patient is receiving dialysis in a chair Yes  Hepatitis B Surface Antigen Results Negative  Date Hepatitis B Surface Antigen Drawn 02/26/18  Hepatitis B Surface Antibody  (<10)  Date Hepatitis B Surface Antibody Drawn 02/26/18  Hemodialysis Consent Verified Yes  Hemodialysis Standing Orders Initiated Yes  ECG (Telemetry) Monitor On Yes  Prime Ordered Normal Saline  Length of  DialysisTreatment -hour(s) 3 Hour(s)  Dialyzer Elisio 17H NR  Dialysate 3K, 2.5 Ca  Dialysis Anticoagulant None  Dialysate Flow Ordered 600  Blood Flow Rate Ordered 400 mL/min  Ultrafiltration Goal 1.5 Liters  Pre  Treatment Labs Hepatitis B Surface Antigen;Renal panel  Dialysis Blood Pressure Support Ordered Normal Saline  Education / Care Plan  Dialysis Education Provided Yes  Documented Education in Care Plan Yes  Hemodialysis Catheter Right Subclavian Double-lumen;Permanent  Placement Date: 03/05/18   Orientation: Right  Access Location: Subclavian  Hemodialysis Catheter Type: Double-lumen;Permanent  Site Condition No complications  Dressing Type Biopatch  Dressing Status Clean;Dry;Intact  Drainage Description None

## 2018-03-23 NOTE — Progress Notes (Signed)
Patient was placed in the chair at 2pm and was put back in bed using hoyer lift at 18:30. Patient tolerating being in the chair well and appeared to have no distress during sitting or transfer.

## 2018-03-23 NOTE — Progress Notes (Signed)
Post HD assessment    03/23/18 1355  Neurological  Level of Consciousness Alert  Orientation Level Disoriented X4  Respiratory  Respiratory Pattern Regular;Unlabored  Chest Assessment Chest expansion symmetrical  Cardiac  Pulse Irregular  ECG Monitor Yes  Cardiac Rhythm Atrial fibrillation  Vascular  R Radial Pulse +1  L Radial Pulse +1  Integumentary  Integumentary (WDL) X  Skin Color Appropriate for ethnicity  Musculoskeletal  Musculoskeletal (WDL) X  Generalized Weakness Yes  Assistive Device None  GU Assessment  Genitourinary (WDL) X  Genitourinary Symptoms  (HD)  Psychosocial  Psychosocial (WDL) X  Patient Behaviors Not interactive

## 2018-03-24 LAB — GLUCOSE, CAPILLARY
Glucose-Capillary: 129 mg/dL — ABNORMAL HIGH (ref 70–99)
Glucose-Capillary: 142 mg/dL — ABNORMAL HIGH (ref 70–99)
Glucose-Capillary: 192 mg/dL — ABNORMAL HIGH (ref 70–99)
Glucose-Capillary: 213 mg/dL — ABNORMAL HIGH (ref 70–99)
Glucose-Capillary: 218 mg/dL — ABNORMAL HIGH (ref 70–99)
Glucose-Capillary: 222 mg/dL — ABNORMAL HIGH (ref 70–99)
Glucose-Capillary: 226 mg/dL — ABNORMAL HIGH (ref 70–99)

## 2018-03-24 LAB — HEPATITIS B SURFACE ANTIGEN: Hepatitis B Surface Ag: NEGATIVE

## 2018-03-24 NOTE — Significant Event (Signed)
Rapid Response Event Note  Overview: Time Called: 2045 Arrival Time: 2050 Event Type: Hypotension  Initial Focused Assessment: Pt. Alert/lethargic (at baseline), post HD with a BP of 59/32.  Jonelle Sidle, RN attempted multiple BP sites all low.  Pt.'s fingers are purple with pitting generalized edema.  Unable to get a manual BP.  Interventions: Paged Dr. Jannifer Franklin, 250 mL bolus ordered and given. BP stabilized, pt. Will be transferred to ICU for Stepdown monitoring. Pt.'s son contacted and notified of change in status.  Event Summary: Name of Physician Notified: Dr. Jannifer Franklin at 2118    at    Outcome: Transferred (Comment)(SD)  Event End Time: 2150  Domingo Pulse [place]

## 2018-03-24 NOTE — Progress Notes (Signed)
Patient transported to ICU stepdown, son at bedside. [name]

## 2018-03-24 NOTE — Progress Notes (Signed)
Patient does not presently meet any requirement for ICU/SDU.  I have placed order for transfer back to [facility] floor.  I have reviewed her medical records and I strongly believe that her current level of care including hemodialysis is excessive given her advanced age and multiple comorbidities.  There is no reasonable hope that she will return to a quality of life that any reasonable person would regard as a favorable outcome.  Merton Border, MD PCCM service Mobile [phone] Pager [phone] 03/24/2018 6:26 PM

## 2018-03-24 NOTE — Progress Notes (Signed)
[facility] [place] NAME: [name]    MR#:  [id number]  DATE OF BIRTH:  Jul 07, 1927  SUBJECTIVE:  CHIEF COMPLAINT:  No chief complaint on file.  The patient is noncommunicative, moaning. REVIEW OF SYSTEMS:  Review of Systems  Unable to perform ROS: Mental status change   DRUG ALLERGIES:   Allergies  Allergen Reactions  . Reglan [Metoclopramide]     Pt's son reports this causes patient tremors and neurologic issues   . Contrast Media [Iodinated Diagnostic Agents]     Sensitivity, kidney issues    VITALS:  Blood pressure 120/74, pulse (!) 53, temperature 97.9 F (36.6 C), temperature source Axillary, resp. rate 13, height 5\' 4"  (1.626 m), weight 75.1 kg, SpO2 100 %.  PHYSICAL EXAMINATION:  Physical Exam  GENERAL:  85 y.o.-year-old elderly patient lying in the bed with no acute distress.  EYES: Pupils equal, round, reactive to light . No scleral icterus.  HEENT: Head atraumatic, normocephalic.  NECK:  Supple, no jugular venous distention. No thyroid enlargement, no tenderness.  LUNGS: Bilateral rhonchi Right chest permacath present CARDIOVASCULAR: S1, S2 normal. No  rubs, or gallops.  ABDOMEN: Soft, nontender, nondistended. Bowel sounds present. No organomegaly or mass.  PEG tube in place EXTREMITIES: 2+ bilateral pedal edema noted.   bilateral hand edema .  No cyanosis, or clubbing.  NEUROLOGIC: Unable to exam. PSYCHIATRIC: The patient is lethargic and noncommunicative. SKIN:  Has a stage II pressure ulcer in the sacral area.   LABORATORY PANEL:   CBC Recent Labs  Lab 03/22/18 0512  WBC 6.8  HGB 7.8*  HCT 26.9*  PLT 127*   ------------------------------------------------------------------------------------------------------------------  Chemistries  Recent Labs  Lab 03/22/18 0512 03/23/18 1052  NA 136 136  K 4.1 4.4  CL 98 98  CO2 31 31  GLUCOSE 106* 293*  BUN 62* 66*  CREATININE 1.71* 1.92*  CALCIUM  8.3* 8.4*  MG 2.0  --    ------------------------------------------------------------------------------------------------------------------  Cardiac Enzymes No results for input(s): TROPONINI in the last 168 hours. ------------------------------------------------------------------------------------------------------------------  RADIOLOGY:  No results found.  EKG:   Orders placed or performed during the hospital encounter of 02/26/18  . EKG 12-Lead  . EKG 12-Lead  . EKG 12-Lead  . EKG 12-Lead    ASSESSMENT AND PLAN:   [name] is an85 y.o.femalewith multiple comorbidities including diabetes type 2, hypertension, hyperlipidemia, coronary disease status post CABG, history of stroke, and CKD who was admitted on1/27/2020. Originally admitted to [facility]withdysphagia and achalasiaand 50 pound weight loss in 12/2017.Status post treatment for ESBL Klebsiella aspiration pneumonia, enterococcus UTI. Admitted for dialysis.  1.  Acute renal failure on CKD stage III- admitted with uremia and encephalopathy Progressed to end-stage renal disease - on hemodialysis.. Working on setting up outpatient dialysis.  Patient has a right chest permacath - Receiving IV albumin during dialysis as needed - Vascular has been consulted for possible PD catheter placement for early next week - Plan was to place PD catheter, but anesthesia canceled - as per them pt is not optimized enough. -Trial again next week to place PD catheter.  2. Acute on chronic combined heart failure-echo with moderate mitral regurgitation and bilateral  pleural effusion.  Presented with anasarca and hypoalbuminemia - Volume overload persists.  Should improve with dialysis hopefully - Patient has finished her antibiotics for aspiration pneumonia. - Continue oxygen. - 03/20/18 night, she had event of hypoxia, so started on antibiotic for aspiration again for 5  days course.  Her oxygen saturation is stable now.    3. Dysphagia secondary to achalasia- s/p Botox injections but causing pharyngeal muscle weakness. - Currently status post PEG tube and tube feeds.  4.  Diabetes mellitus-appreciate diabetes coordinator's input. On SSI.  5.  Left temporal and frontal CVA-on Plavix and statin  6.  Anemia-likely anemia of chronic disease . -transfusion if hemoglobin is less than 7. Continue EPO with dialysis.  7.  Acute encephalopathy-initially was in uremic encephalopathy with decreased mentation.  Now  confused.  Underlying delirium .  Continue to monitor. - needing phenergan PRN prior to dialysis as she is still screaming during dialysis - Family refusing haldol. - Pt does have changes due to Stroke.  Physical therapy consulted- patient was able to sit in a chair   Hypotension.  Patient is transferred to ICU due to hypotension.  On Midodrin. Blood pressure is better.  Very poor prognosis.  Continue treatment as requested by family.  High risk for deterioration.  All the records are reviewed and case discussed with Care Management/Social Worker Management plans discussed with the patient, family and they are in agreement.  CODE STATUS: Full code  TOTAL TIME TAKING CARE OF THIS PATIENT: 25 minutes.   Demetrios Loll M.D on 03/24/2018 at 3:42 PM  Between 7am to 6pm - Pager - [phone]  After 6pm go to [email or web address] - password [email or web address] [facility]  Office  [phone]  CC: Primary care physician; Deland Pretty, MD

## 2018-03-24 NOTE — Progress Notes (Signed)
eLink Physician-Brief Progress Note Patient Name: Bridgit Eynon DOB: 05-Oct-1927 MRN: [id number]   Date of Service  03/24/2018  HPI/Events of Note  39 F with MMP including recent ESRD diagnosis.  IHD today with the removal of 2 liters.  Hypotensive.   Given 250cc bolus of IVF with current BP now 101/72.  Of note the paitent is on midodrine.  eICU Interventions  Plan; Continue to monitor in SDU No pressors at present Consider albumin for BP support if needed     Intervention Category Evaluation Type: New Patient Evaluation  DETERDING,ELIZABETH 03/24/2018, 12:41 AM

## 2018-03-24 NOTE — Progress Notes (Signed)
Ch received pg for RT for pt in 213. NT shared that pt heart rate dropped. Pt did receive dialysis trt today. Ch did not notice family present and pt was somewhat responsive to care team.       03/24/18 2000  Clinical Encounter Type  Visited With Health care provider;Patient  Visit Type Code  Referral From Nurse  Consult/Referral To Chaplain  Spiritual Encounters  Spiritual Needs Emotional  Stress Factors  Patient Stress Factors Health changes;Loss of control;Major life changes  Family Stress Factors None identified

## 2018-03-24 NOTE — Progress Notes (Signed)
Pharmacy Antibiotic Note  [name] is a 85 y.o. female admitted on 02/26/2018 with aspiration PNA .  Pharmacy has been consulted for Unasyn dosing.  Plan: Continue Unasyn 3 gm IV Q12H (ESRD)   Height: 5\' 4"  (162.6 cm) Weight: 165 lb 9.1 oz (75.1 kg) IBW/kg (Calculated) : 54.7  Temp (24hrs), Avg:97.5 F (36.4 C), Min:97 F (36.1 C), Max:98 F (36.7 C)  Recent Labs  Lab 03/19/18 0527 03/22/18 0512 03/23/18 1052  WBC 8.1 6.8  --   CREATININE  --  1.71* 1.92*    Estimated Creatinine Clearance: 20.1 mL/min (A) (by C-G formula based on SCr of 1.92 mg/dL (H)).    Allergies  Allergen Reactions  . Reglan [Metoclopramide]     Pt's son reports this causes patient tremors and neurologic issues   . Contrast Media [Iodinated Diagnostic Agents]     Sensitivity, kidney issues    Antimicrobials this admission:  Unasyn 2/05 >> 2/07  Unasyn 2/19 >>   Dose adjustments this admission: None  Microbiology results:   MRSA PCR: Positive  Thank you for allowing pharmacy to be a part of this patient's care.  Pernell Dupre, PharmD, BCPS Clinical Pharmacist 03/24/2018 10:09 AM

## 2018-03-24 NOTE — Progress Notes (Signed)
[facility]  ROUNDING NOTE   Subjective:   Patient remains critically ill.  Brought into the ICU for observation at night after noted to have hypotension and possibly cyanotic toes.  2 L of fluid was removed with hemodialysis yesterday.  Patient was given 250 cc normal saline bolus She remains quite somnolent today.  Did not wake up to any commands.  Continues to make grunting noises intermittently   Objective:  Vital signs in last 24 hours:  Temp:  [97 F (36.1 C)-98 F (36.7 C)] 97.7 F (36.5 C) (02/22 0400) Pulse Rate:  [38-100] 38 (02/22 0600) Resp:  [10-102] 20 (02/22 0600) BP: (32-136)/(25-107) 104/66 (02/22 0600) SpO2:  [88 %-100 %] 99 % (02/22 0600) Weight:  [75.1 kg-78.4 kg] 75.1 kg (02/22 0100)  Weight change: -0.072 kg Filed Weights   03/23/18 1004 03/23/18 1356 03/24/18 0100  Weight: 78.4 kg 76.4 kg 75.1 kg    Intake/Output: I/O last 3 completed shifts: In: 1633.2 [I.V.:54.5; NG/GT:1172.7; IV Piggyback:406.1] Out: 2028 [Other:2028]   Intake/Output this shift:  No intake/output data recorded.  Physical Exam: General:  Critically ill-appearing, laying in the bed  Head:  Temporal muscle wasting  Eyes:  Did not open eyes today  Neck: Supple,   Lungs:   Normal breathing effort, [place] O2  Heart:  Irregular rhythm  Abdomen:  Soft, nontender, +G-tube  Extremities:  Generalized anasarca  Neurologic:  Somnolent, did not follow any commands  Access: RIJ permcath    Basic Metabolic Panel: Recent Labs  Lab 03/22/18 0512 03/23/18 1052  NA 136 136  K 4.1 4.4  CL 98 98  CO2 31 31  GLUCOSE 106* 293*  BUN 62* 66*  CREATININE 1.71* 1.92*  CALCIUM 8.3* 8.4*  MG 2.0  --   PHOS  --  2.1*    Liver Function Tests: Recent Labs  Lab 03/23/18 1052  ALBUMIN 2.3*   No results for input(s): LIPASE, AMYLASE in the last 168 hours. No results for input(s): AMMONIA in the last 168 hours.  CBC: Recent Labs  Lab 03/19/18 0527 03/22/18 0512  WBC 8.1  6.8  NEUTROABS 6.5  --   HGB 7.7* 7.8*  HCT 27.1* 26.9*  MCV 109.3* 107.6*  PLT 123* 127*    Cardiac Enzymes: No results for input(s): CKTOTAL, CKMB, CKMBINDEX, TROPONINI in the last 168 hours.  BNP: Invalid input(s): POCBNP  CBG: Recent Labs  Lab 03/23/18 1545 03/23/18 2022 03/24/18 0023 03/24/18 0351 03/24/18 0741  GLUCAP 145* 213* 25* 222* 142*    Microbiology: Results for orders placed or performed during the hospital encounter of 02/26/18  MRSA PCR Screening     Status: Abnormal   Collection Time: 02/26/18  5:39 PM  Result Value Ref Range Status   MRSA by PCR POSITIVE (A) NEGATIVE Final    Comment:        The GeneXpert MRSA Assay (FDA approved for NASAL specimens only), is one component of a comprehensive MRSA colonization surveillance program. It is not intended to diagnose MRSA infection nor to guide or monitor treatment for MRSA infections. CRITICAL RESULT CALLED TO, READ BACK BY AND VERIFIED WITH:  CALLED TO DANA DUGGINS RN @1920  Performed at [facility], [address] [place], [place] [address]   CULTURE, BLOOD (ROUTINE X 2) w Reflex to ID Panel     Status: None   Collection Time: 03/06/18  9:01 PM  Result Value Ref Range Status   Specimen Description BLOOD LEFT ANTECUBITAL  Final  Special Requests   Final    BOTTLES DRAWN AEROBIC ONLY Blood Culture adequate volume   Culture   Final    NO GROWTH 5 DAYS Performed at [facility], [address] [place], [place] [address]    Report Status 03/11/2018 FINAL  Final  CULTURE, BLOOD (ROUTINE X 2) w Reflex to ID Panel     Status: None   Collection Time: 03/06/18  9:37 PM  Result Value Ref Range Status   Specimen Description BLOOD LEFT ANTECUBITAL  Final   Special Requests   Final    BOTTLES DRAWN AEROBIC AND ANAEROBIC Blood Culture adequate volume   Culture   Final    NO GROWTH 5 DAYS Performed at [facility], [address], [place], [place] [address]    Report  Status 03/11/2018 FINAL  Final    Coagulation Studies: Recent Labs    03/22/18 0512  LABPROT 17.3*  INR 1.43    Urinalysis: No results for input(s): COLORURINE, LABSPEC, PHURINE, GLUCOSEU, HGBUR, BILIRUBINUR, KETONESUR, PROTEINUR, UROBILINOGEN, NITRITE, LEUKOCYTESUR in the last 72 hours.  Invalid input(s): APPERANCEUR    Imaging: No results found.   Medications:   . sodium chloride 10 mL/hr at 03/24/18 0600  . albumin human 25 g (03/21/18 1256)  . ampicillin-sulbactam (UNASYN) IV Stopped (03/24/18 0002)  . feeding supplement (OSMOLITE 1.5 CAL) 1,000 mL (03/23/18 2042)   . atorvastatin  40 mg Per Tube q1800  . B-complex with vitamin C  1 tablet Per Tube Daily  . chlorhexidine  15 mL Mouth Rinse BID  . clopidogrel  75 mg Per Tube QHS  . collagenase   Topical Daily  . epoetin (EPOGEN/PROCRIT) injection  10,000 Units Intravenous Q M,W,F-HD  . famotidine  10 mg Per Tube Daily  . feeding supplement (PRO-STAT SUGAR FREE 64)  30 mL Per Tube BID  . free water  20 mL Per Tube Q4H  . heparin injection (subcutaneous)  5,000 Units Subcutaneous Q8H  . insulin aspart  0-9 Units Subcutaneous Q4H  . mouth rinse  15 mL Mouth Rinse q12n4p  . midodrine  5 mg Oral BID WC  . multivitamin  15 mL Per Tube QHS  . nutrition supplement (JUVEN)  1 packet Per Tube BID BM  . pantoprazole (PROTONIX) IV  40 mg Intravenous Daily   sodium chloride, acetaminophen (TYLENOL) oral liquid 160 mg/5 mL **OR** acetaminophen **OR** acetaminophen, albumin human, albuterol, ipratropium, lip balm, Melatonin, metoprolol tartrate, nitroGLYCERIN, ondansetron, polyethylene glycol, promethazine  Assessment/ Plan:  [name] is a 85 y.o. black female with diabetes mellitus type II, hypertension, hyperlipidemia, coronary disease status post CABG, history of stroke, CKD, was admitted on1/27/2020. Originally admitted to [place] hospitalwith Achalasiaand 50 pound weight loss, dysarthria since November 2019. Now  transitioned to [facility] on 02/26/18 to initiate hemodialysis. First hemodialysis treatment on 1/28 through [facility] [place].   1. End Stage Renal Disease with generalized edema   No signs of renal recovery.  - Will Continue MWF schedule - Ultrafiltration has been limited due to low blood pressure.  Did not tolerate UF of 2 L yesterday -Sat in the chair for hemodialysis on Friday -Heart rhythm atrial fibrillation - Family is desiring peritoneal dialysis on discharge.  PD catheter placement re-scheduled for Thursday of next week once medically optimized.  If PD catheter is not able to be placed, still may be discharged with hemodialysis.  Outpatient discharge planning is in progress. -If PD catheter is able to be placed, training for PD  expected to start on the following Monday -Next hemodialysis planned for this Monday  2. Hypotension: - midodrine with hemodialysis.  -May have to limit UF to 1 to 1.5 kg per session  3. Anemia of chronic kidney disease:  Macrocytic. Hemoglobin 7.8 macrocytic. with thrombocytopenia.  - EPO with HD treatment -Avoiding IV iron due to concurrent treatment for infection/aspiration pneumonia  4. Malnutrition with achalasia: G tube in place   Albumin remains low at 2.3 - IV albumin with HD treatment for oncotic support as necessary  5.  Aspiration pneumonia Treated with IV Unasyn.  Discontinued February 22 Management as per ICU/internal medicine team  6.  Cardiac history.  Atrial fibrillation.  CABG times 05/2009, coronary angioplasty and stent 2004.  Severe chronic systolic CHF.  LVEF 25%.  Right ventricular systolic pressure 53 mm.  From echo February 2020     LOS: [place] 2/22/20208:57 AM

## 2018-03-25 LAB — CBC
HCT: 26.4 % — ABNORMAL LOW (ref 36.0–46.0)
Hemoglobin: 8 g/dL — ABNORMAL LOW (ref 12.0–15.0)
MCH: 31.5 pg (ref 26.0–34.0)
MCHC: 30.3 g/dL (ref 30.0–36.0)
MCV: 103.9 fL — ABNORMAL HIGH (ref 80.0–100.0)
Platelets: 118 10*3/uL — ABNORMAL LOW (ref 150–400)
RBC: 2.54 MIL/uL — ABNORMAL LOW (ref 3.87–5.11)
RDW: 20.3 % — ABNORMAL HIGH (ref 11.5–15.5)
WBC: 9.1 10*3/uL (ref 4.0–10.5)
nRBC: 0 % (ref 0.0–0.2)

## 2018-03-25 LAB — GLUCOSE, CAPILLARY
GLUCOSE-CAPILLARY: 211 mg/dL — AB (ref 70–99)
Glucose-Capillary: 180 mg/dL — ABNORMAL HIGH (ref 70–99)
Glucose-Capillary: 233 mg/dL — ABNORMAL HIGH (ref 70–99)
Glucose-Capillary: 236 mg/dL — ABNORMAL HIGH (ref 70–99)
Glucose-Capillary: 239 mg/dL — ABNORMAL HIGH (ref 70–99)
Glucose-Capillary: 240 mg/dL — ABNORMAL HIGH (ref 70–99)

## 2018-03-25 NOTE — Progress Notes (Signed)
During report rapid response called for at 1928 SpO2 saturation in the 50's - 60's HR 40- Pt. Alert at baseline, responsive to voice.  VSS on 4 L [place], SpO2 on ear 100%.    Team weaned Oxygen  back to baseline 2 L [place]- Pt. SpO2 reading 100%, other VSS   Patient stabalized  RN received report from Magazine features editor.

## 2018-03-25 NOTE — Progress Notes (Signed)
[facility]  ROUNDING NOTE   Subjective:   Patient remains critically ill.  Patient was brought into the ICU for observation on Friday night/Saturday morning after noted to have hypotension and possibly cyanotic toes.  2 L of fluid was removed with hemodialysis on Friday.  Blood pressure improved with normal saline boluses She remains quite somnolent today.  She open her eyes to sound but did not follow any commands  Objective:  Vital signs in last 24 hours:  Temp:  [97.5 F (36.4 C)-98.5 F (36.9 C)] 98.4 F (36.9 C) (02/23 0609) Pulse Rate:  [35-91] 63 (02/23 0609) Resp:  [10-18] 16 (02/23 0609) BP: (108-129)/(50-83) 129/83 (02/23 0609) SpO2:  [53 %-100 %] 100 % (02/23 0609) Weight:  [79.8 kg] 79.8 kg (02/23 0514)  Weight change: 1.388 kg Filed Weights   03/23/18 1356 03/24/18 0100 03/25/18 0514  Weight: 76.4 kg 75.1 kg 79.8 kg    Intake/Output: I/O last 3 completed shifts: In: 1682.5 [I.V.:134; Other:90; NG/GT:1190; IV Piggyback:268.5] Out: -    Intake/Output this shift:  No intake/output data recorded.  Physical Exam: General:  Critically ill-appearing, laying in the bed  Head:  Temporal muscle wasting  Eyes:  Eyes open, anicteric  Neck: Supple,   Lungs:   Normal breathing effort, [place] O2  Heart:  Irregular rhythm  Abdomen:  Soft, nontender, +G-tube  Extremities:  Generalized anasarca  Neurologic:  Somnolent, opens eyes to sound but did not follow any commands  Access: RIJ permcath    Basic Metabolic Panel: Recent Labs  Lab 03/22/18 0512 03/23/18 1052  NA 136 136  K 4.1 4.4  CL 98 98  CO2 31 31  GLUCOSE 106* 293*  BUN 62* 66*  CREATININE 1.71* 1.92*  CALCIUM 8.3* 8.4*  MG 2.0  --   PHOS  --  2.1*    Liver Function Tests: Recent Labs  Lab 03/23/18 1052  ALBUMIN 2.3*   No results for input(s): LIPASE, AMYLASE in the last 168 hours. No results for input(s): AMMONIA in the last 168 hours.  CBC: Recent Labs  Lab 03/19/18 0527  03/22/18 0512 03/25/18 0911  WBC 8.1 6.8 9.1  NEUTROABS 6.5  --   --   HGB 7.7* 7.8* 8.0*  HCT 27.1* 26.9* 26.4*  MCV 109.3* 107.6* 103.9*  PLT 123* 127* 118*    Cardiac Enzymes: No results for input(s): CKTOTAL, CKMB, CKMBINDEX, TROPONINI in the last 168 hours.  BNP: Invalid input(s): POCBNP  CBG: Recent Labs  Lab 03/24/18 1936 03/24/18 2330 03/25/18 0439 03/25/18 0738 03/25/18 1129  GLUCAP 218* 213* 180* 87* 42*    Microbiology: Results for orders placed or performed during the hospital encounter of 02/26/18  MRSA PCR Screening     Status: Abnormal   Collection Time: 02/26/18  5:39 PM  Result Value Ref Range Status   MRSA by PCR POSITIVE (A) NEGATIVE Final    Comment:        The GeneXpert MRSA Assay (FDA approved for NASAL specimens only), is one component of a comprehensive MRSA colonization surveillance program. It is not intended to diagnose MRSA infection nor to guide or monitor treatment for MRSA infections. CRITICAL RESULT CALLED TO, READ BACK BY AND VERIFIED WITH:  CALLED TO DANA DUGGINS RN @1920  Performed at [facility], [address] [place], [place] [address]   CULTURE, BLOOD (ROUTINE X 2) w Reflex to ID Panel     Status: None   Collection Time: 03/06/18  9:01 PM  Result Value  Ref Range Status   Specimen Description BLOOD LEFT ANTECUBITAL  Final   Special Requests   Final    BOTTLES DRAWN AEROBIC ONLY Blood Culture adequate volume   Culture   Final    NO GROWTH 5 DAYS Performed at [facility], [address] [place], [place] [address]    Report Status 03/11/2018 FINAL  Final  CULTURE, BLOOD (ROUTINE X 2) w Reflex to ID Panel     Status: None   Collection Time: 03/06/18  9:37 PM  Result Value Ref Range Status   Specimen Description BLOOD LEFT ANTECUBITAL  Final   Special Requests   Final    BOTTLES DRAWN AEROBIC AND ANAEROBIC Blood Culture adequate volume   Culture   Final    NO GROWTH 5 DAYS Performed at  [facility], [address] [place], [place] [address]    Report Status 03/11/2018 FINAL  Final    Coagulation Studies: No results for input(s): LABPROT, INR in the last 72 hours.  Urinalysis: No results for input(s): COLORURINE, LABSPEC, PHURINE, GLUCOSEU, HGBUR, BILIRUBINUR, KETONESUR, PROTEINUR, UROBILINOGEN, NITRITE, LEUKOCYTESUR in the last 72 hours.  Invalid input(s): APPERANCEUR    Imaging: No results found.   Medications:   . sodium chloride Stopped (03/24/18 2221)  . albumin human 25 g (03/21/18 1256)  . ampicillin-sulbactam (UNASYN) IV Stopped (03/24/18 2200)  . feeding supplement (OSMOLITE 1.5 CAL) 1,000 mL (03/23/18 2042)   . atorvastatin  40 mg Per Tube q1800  . B-complex with vitamin C  1 tablet Per Tube Daily  . chlorhexidine  15 mL Mouth Rinse BID  . clopidogrel  75 mg Per Tube QHS  . collagenase   Topical Daily  . epoetin (EPOGEN/PROCRIT) injection  10,000 Units Intravenous Q M,W,F-HD  . famotidine  10 mg Per Tube Daily  . feeding supplement (PRO-STAT SUGAR FREE 64)  30 mL Per Tube BID  . free water  20 mL Per Tube Q4H  . insulin aspart  0-9 Units Subcutaneous Q4H  . mouth rinse  15 mL Mouth Rinse q12n4p  . midodrine  5 mg Oral BID WC  . multivitamin  15 mL Per Tube QHS  . nutrition supplement (JUVEN)  1 packet Per Tube BID BM   sodium chloride, acetaminophen (TYLENOL) oral liquid 160 mg/5 mL **OR** acetaminophen **OR** acetaminophen, albumin human, albuterol, ipratropium, lip balm, Melatonin, metoprolol tartrate, nitroGLYCERIN, ondansetron, polyethylene glycol, promethazine  Assessment/ Plan:  [name] is a 85 y.o. black female with diabetes mellitus type II, hypertension, hyperlipidemia, coronary disease status post CABG, history of stroke, CKD, was admitted on1/27/2020. Originally admitted to [place] hospitalwith Achalasiaand 50 pound weight loss, dysarthria since November 2019. Now transitioned to [facility] on 02/26/18 to initiate  hemodialysis. First hemodialysis treatment on 1/28 through [place].   1. End Stage Renal Disease with generalized edema   No signs of renal recovery.  - Will Continue MWF schedule - Ultrafiltration has been limited due to low blood pressure.  Did not tolerate UF of 2 L yesterday -Sat in the chair for hemodialysis on Friday -Heart rhythm atrial fibrillation - Family is desiring peritoneal dialysis on discharge.  PD catheter placement re-scheduled for Thursday however, patient is unlikely to be medically optimized for surgery   -if PD catheter is not able to be placed, still may be discharged with hemodialysis.  Outpatient discharge planning is in progress. -If PD catheter is able to be placed, training for PD expected to start on the following Monday -  Next hemodialysis planned for this Monday  2. Hypotension: - midodrine with hemodialysis.  -May have to limit UF to 1 to 1.5 kg per session as she is not able to tolerate more because of arrhythmias and hypotension  3. Anemia of chronic kidney disease:  Macrocytic. Hemoglobin 8.0 macrocytic. with thrombocytopenia.  - EPO with HD treatment -Avoiding IV iron due to concurrent treatment for infection/aspiration pneumonia  4. Malnutrition with achalasia: G tube in place   Albumin remains low at 2.3 - IV albumin with HD treatment for oncotic support as necessary  5.  Aspiration pneumonia Treated with IV Unasyn.  Discontinued February 22 Management as per internal medicine team  6.  Cardiac history.  Atrial fibrillation.  CABG times 05/2009, coronary angioplasty and stent 2004.  Severe chronic systolic CHF.  LVEF 25%.  Right ventricular systolic pressure 53 mm.  From echo February 2020     LOS: [place] 2/23/202011:49 AM

## 2018-03-25 NOTE — Significant Event (Addendum)
Rapid Response Event Note  Overview: Time Called: 1928 Arrival Time: 1930 Event Type: Respiratory  Initial Focused Assessment: Called for SpO2 saturation in the 50's - 60's. Pt. Alert at baseline, responsive to voice.  VSS on 4 L [place], SpO2 on ear 100%.    Interventions: Oxygen weaned back to baseline 2 L [place]- Pt. SpO2 reading 100%, other VSS.  Plan of Care (if not transferred): Care RN instructed to call if pt.'s vitals/status deteriorate.  Event Summary:   Outcome: Stayed in room and stabalized  Event End Time: 2000  Domingo Pulse [place]

## 2018-03-25 NOTE — Progress Notes (Addendum)
MD notified: The patient was more alert this morning. Right now she is very lethargic not able to keep her eyes open. Vital signs optained. Her blood pressure has been 98/62(71), and 95/68(79), Temp 98.5, HR 71, O2 100% on 2L. Do you want to come to see the patient. I will recheck vitals in an hour to see how she is doing in case we need to transfer her again.

## 2018-03-25 NOTE — Progress Notes (Signed)
[place] [facility] [place] NAME: Eliot Bencivenga    MR#:  [id number]  DATE OF BIRTH:  Dec 08, 1927  SUBJECTIVE:  CHIEF COMPLAINT:  No chief complaint on file.  The patient is unresponsive to verbal stimuli.  She had hypoxia last night but SAT is stable this morning, on oxygen by nasal cannula 2 L. REVIEW OF SYSTEMS:  Review of Systems  Unable to perform ROS: Mental status change   DRUG ALLERGIES:   Allergies  Allergen Reactions  . Reglan [Metoclopramide]     Pt's son reports this causes patient tremors and neurologic issues   . Contrast Media [Iodinated Diagnostic Agents]     Sensitivity, kidney issues    VITALS:  Blood pressure 130/84, pulse 66, temperature 98.3 F (36.8 C), temperature source Oral, resp. rate 17, height 5\' 4"  (1.626 m), weight 79.8 kg, SpO2 99 %.  PHYSICAL EXAMINATION:  Physical Exam  GENERAL:  85 y.o.-year-old elderly patient lying in the bed with no acute distress.  EYES: Pupils equal, round, reactive to light . No scleral icterus.  HEENT: Head atraumatic, normocephalic.  NECK:  Supple, no jugular venous distention. No thyroid enlargement, no tenderness.  LUNGS: Bilateral rhonchi Right chest permacath present CARDIOVASCULAR: S1, S2 normal. No  rubs, or gallops.  ABDOMEN: Soft, nontender, nondistended. Bowel sounds present. No organomegaly or mass.  PEG tube in place EXTREMITIES: Anasarca.  No cyanosis, or clubbing.  NEUROLOGIC: Unable to exam. PSYCHIATRIC: The patient is lethargic and noncommunicative. SKIN:  Has a stage II pressure ulcer in the sacral area. LABORATORY PANEL:   CBC Recent Labs  Lab 03/25/18 0911  WBC 9.1  HGB 8.0*  HCT 26.4*  PLT 118*   ------------------------------------------------------------------------------------------------------------------  Chemistries  Recent Labs  Lab 03/22/18 0512 03/23/18 1052  NA 136 136  K 4.1 4.4  CL 98 98  CO2 31 31  GLUCOSE 106* 293*  BUN 62*  66*  CREATININE 1.71* 1.92*  CALCIUM 8.3* 8.4*  MG 2.0  --    ------------------------------------------------------------------------------------------------------------------  Cardiac Enzymes No results for input(s): TROPONINI in the last 168 hours. ------------------------------------------------------------------------------------------------------------------  RADIOLOGY:  No results found.  EKG:   Orders placed or performed during the hospital encounter of 02/26/18  . EKG 12-Lead  . EKG 12-Lead  . EKG 12-Lead  . EKG 12-Lead    ASSESSMENT AND PLAN:   Janiel Derhammer is an85 y.o.femalewith multiple comorbidities including diabetes type 2, hypertension, hyperlipidemia, coronary disease status post CABG, history of stroke, and CKD who was admitted on1/27/2020. [facility] [place] [facility] and achalasiaand 50 pound weight loss in 12/2017.Status post treatment for ESBL Klebsiella aspiration pneumonia, enterococcus UTI. Admitted for dialysis.  1.  Acute renal failure on CKD stage III- admitted with uremia and encephalopathy Progressed to end-stage renal disease - on hemodialysis.. Working on setting up outpatient dialysis.  Patient has a right chest permacath - Receiving IV albumin during dialysis as needed - Vascular has been consulted for possible PD catheter placement for early next week - Plan was to place PD catheter, but anesthesia canceled - as per them pt is not optimized enough. She is not a good candidate for PD per Dr. Candiss Norse.  2. Acute on chronic combined heart failure-echo with moderate mitral regurgitation and bilateral  pleural effusion.  Presented with anasarca and hypoalbuminemia - Volume overload persists.  Should improve with dialysis hopefully - Patient has finished her antibiotics for aspiration pneumonia. - Continue oxygen. - 03/20/18 night, she had event  of hypoxia, so started on antibiotic for aspiration again for 5 days  course.  Her oxygen saturation is stable now.   3. Dysphagia secondary to achalasia- s/p Botox injections but causing pharyngeal muscle weakness. - Currently status post PEG tube and tube feeds.  4.  Diabetes mellitus-appreciate diabetes coordinator's input. On SSI.  5.  Left temporal and frontal CVA-on Plavix and statin  6.  Anemia-likely anemia of chronic disease . -transfusion if hemoglobin is less than 7. Continue EPO with dialysis.  7.  Acute encephalopathy-initially was in uremic encephalopathy with decreased mentation.  Now  confused.  Underlying delirium .  Continue to monitor. - needing phenergan PRN prior to dialysis as she is still screaming during dialysis - Family refusing haldol. - Pt does have changes due to Stroke.  Physical therapy consulted- patient was able to sit in a chair   Hypotension.  Patient was transferred to ICU due to hypotension.  On Midodrin. Blood pressure is better.  Very poor prognosis.  Continue treatment as requested by family.  High risk for deterioration.  Discussed with Dr. Candiss Norse. All the records are reviewed and case discussed with Care Management/Social Worker Management plans discussed with the patient, family and they are in agreement.  CODE STATUS: Full code  TOTAL TIME TAKING CARE OF THIS PATIENT: 25 minutes.   Demetrios Loll M.D on 03/25/2018 at 2:23 PM  Between 7am to 6pm - Pager - [phone]  After 6pm go to [email or web address] - password [email or web address]  Office  [phone]  CC: Primary care physician; Deland Pretty, MD

## 2018-03-25 NOTE — Progress Notes (Signed)
RN informed Hospitalist of family advisement of adverse reaction to Heparin and requested it to be dc'd.  See new order.

## 2018-03-25 NOTE — Progress Notes (Signed)
Md messaged:  The patient has edema +3 in upper and lower extremities.

## 2018-03-26 LAB — GLUCOSE, CAPILLARY
Glucose-Capillary: 282 mg/dL — ABNORMAL HIGH (ref 70–99)
Glucose-Capillary: 275 mg/dL — ABNORMAL HIGH (ref 70–99)
Glucose-Capillary: 159 mg/dL — ABNORMAL HIGH (ref 70–99)
Glucose-Capillary: 195 mg/dL — ABNORMAL HIGH (ref 70–99)
Glucose-Capillary: 220 mg/dL — ABNORMAL HIGH (ref 70–99)

## 2018-03-26 NOTE — Progress Notes (Signed)
Pre HD assessment   03/26/18 1016  Vital Signs  Temp 98.4 F (36.9 C)  Temp Source Axillary  Pulse Rate 100  Pulse Rate Source Monitor  Resp 18  BP (!) 158/110  BP Location Left Arm  BP Method Automatic  Patient Position (if appropriate) Sitting  Oxygen Therapy  SpO2 99 %  O2 Device Nasal Cannula  O2 Flow Rate (L/min) 2 L/min  Pain Assessment  Pain Scale PAINAD  PAINAD (Pain Assessment in Advanced Dementia)  Breathing 0  Negative Vocalization 1  Facial Expression 0  Body Language 0  Consolability 0  PAINAD Score 1  Dialysis Weight  Weight 79.2 kg  Type of Weight Pre-Dialysis  Time-Out for Hemodialysis  What Procedure? HD  Pt Identifiers(min of two) First/Last Name;MRN/Account#  Correct Site? Yes  Correct Side? Yes  Correct Procedure? Yes  Consents Verified? Yes  Rad Studies Available? N/A  Safety Precautions Reviewed? Yes  Engineer, civil (consulting) Number  (4A)  Station Number 4  UF/Alarm Test Passed  Conductivity: Meter 14  Conductivity: Machine  13.9  pH 7.2  Reverse Osmosis main  Normal Saline Lot Number [id number]  Dialyzer Lot Number [id number]  Disposable Set Lot Number [id number]  Machine Temperature 98.6 F (37 C)  Musician and Audible Yes  Blood Lines Intact and Secured Yes  Pre Treatment Patient Checks  Vascular access used during treatment Catheter  Patient is receiving dialysis in a chair Yes  Hepatitis B Surface Antigen Results Negative  Date Hepatitis B Surface Antigen Drawn 03/23/18  Isolation Initiated Yes  Hepatitis B Surface Antibody  (<10)  Date Hepatitis B Surface Antibody Drawn 02/26/18  Hemodialysis Consent Verified Yes  Hemodialysis Standing Orders Initiated Yes  ECG (Telemetry) Monitor On Yes  Prime Ordered Normal Saline  Length of  DialysisTreatment -hour(s) 3 Hour(s)  Dialyzer Elisio 17H NR  Dialysate 3K, 2.5 Ca  Dialysis Anticoagulant None  Dialysate Flow Ordered 600  Blood Flow Rate Ordered 400 mL/min  Ultrafiltration  Goal 1.5 Liters  Dialysis Blood Pressure Support Ordered Normal Saline  Education / Care Plan  Dialysis Education Provided Yes  Documented Education in Care Plan Yes  Hemodialysis Catheter Right Subclavian Double-lumen;Permanent  Placement Date: 03/05/18   Orientation: Right  Access Location: Subclavian  Hemodialysis Catheter Type: Double-lumen;Permanent  Site Condition No complications  Dressing Type Biopatch  Dressing Status Clean;Dry;Intact  Drainage Description None

## 2018-03-26 NOTE — Progress Notes (Signed)
Post HD assessment Pt tolerated tx well without c/o or complication.Net UF 1515, goal met.    03/26/18 1335  Vital Signs  Temp (!) 79.9 F (26.6 C)  Temp Source Axillary  Pulse Rate 84  Pulse Rate Source Monitor  Resp 17  BP 119/60  BP Location Left Arm  BP Method Automatic  Patient Position (if appropriate) Sitting  Oxygen Therapy  SpO2 100 %  O2 Device Nasal Cannula  O2 Flow Rate (L/min) 2 L/min  Dialysis Weight  Weight 78 kg  Type of Weight Post-Dialysis  Post-Hemodialysis Assessment  Rinseback Volume (mL) 250 mL  KECN 68.2 V  Dialyzer Clearance Lightly streaked  Duration of HD Treatment -hour(s) 3 hour(s)  Hemodialysis Intake (mL) 500 mL  UF Total -Machine (mL) 2015 mL  Net UF (mL) 1515 mL  Tolerated HD Treatment Yes  Education / Care Plan  Dialysis Education Provided Yes  Documented Education in Care Plan Yes  Hemodialysis Catheter Right Subclavian Double-lumen;Permanent  Placement Date: 03/05/18   Orientation: Right  Access Location: Subclavian  Hemodialysis Catheter Type: Double-lumen;Permanent  Site Condition No complications  Blue Lumen Status Heparin locked  Red Lumen Status Heparin locked  Purple Lumen Status N/A  Catheter fill solution Heparin 1000 units/ml  Catheter fill volume (Arterial) 1.5 cc  Catheter fill volume (Venous) 1.5  Dressing Type Biopatch  Dressing Status Clean;Dry;Intact  Drainage Description None  Post treatment catheter status Capped and Clamped   

## 2018-03-26 NOTE — Progress Notes (Signed)
HD tx end    03/26/18 1328  Vital Signs  Pulse Rate 87  Pulse Rate Source Monitor  Resp 17  BP 135/60  BP Location Left Arm  BP Method Automatic  Patient Position (if appropriate) Sitting  Oxygen Therapy  SpO2 100 %  O2 Device Nasal Cannula  O2 Flow Rate (L/min) 2 L/min  During Hemodialysis Assessment  Dialysis Fluid Bolus Normal Saline  Bolus Amount (mL) 250 mL  Intra-Hemodialysis Comments Tx completed

## 2018-03-26 NOTE — Progress Notes (Signed)
Inpatient Diabetes Program Recommendations  AACE/ADA: New Consensus Statement on Inpatient Glycemic Control (2015)  Target Ranges:  Prepandial:   less than 140 mg/dL      Peak postprandial:   less than 180 mg/dL (1-2 hours)      Critically ill patients:  140 - 180 mg/dL   Lab Results  Component Value Date   GLUCAP 275 (H) 03/26/2018   HGBA1C 5.9 (H) 12/24/2017    Review of Glycemic Control Results for [name], Darlene (MRN [id number]) as of 03/26/2018 13:00  Ref. Range 03/25/2018 16:50 03/25/2018 21:07 03/25/2018 23:06 03/26/2018 05:02 03/26/2018 07:40  Glucose-Capillary Latest Ref Range: 70 - 99 mg/dL 236 (H) 239 (H) 240 (H) 282 (H) 275 (H)  Diabetes history:DM2 Outpatient Diabetes medications:Glipizide XL 10 mg QAM Current orders for Inpatient glycemic control:Novolog 0-9 units Q4H, Osmolite @ 40 ml/hr Inpatient Diabetes Program Recommendations:  Consider adding Levemir 5 units daily if appropriate.    Thanks,  Adah Perl, RN, BC-ADM Inpatient Diabetes Coordinator Pager [phone] (8a-5p)

## 2018-03-26 NOTE — Progress Notes (Signed)
[facility]  ROUNDING NOTE   Subjective:  Patient seen and evaluated during hemodialysis. Moaning during the treatment.    HEMODIALYSIS FLOWSHEET:  Blood Flow Rate (mL/min): 400 mL/min Arterial Pressure (mmHg): -160 mmHg Venous Pressure (mmHg): 130 mmHg Transmembrane Pressure (mmHg): 50 mmHg Ultrafiltration Rate (mL/min): 670 mL/min Dialysate Flow Rate (mL/min): 600 ml/min Conductivity: Machine : 13.9 Conductivity: Machine : 13.9 Dialysis Fluid Bolus: Normal Saline Bolus Amount (mL): 250 mL Dialysate Change: (MD at bedside, 3K bath)    Objective:  Vital signs in last 24 hours:  Temp:  [98 F (36.7 C)-98.7 F (37.1 C)] 98.4 F (36.9 C) (02/24 1016) Pulse Rate:  [57-107] 88 (02/24 1215) Resp:  [16-23] 18 (02/24 1215) BP: (95-158)/(51-110) 129/63 (02/24 1215) SpO2:  [99 %-100 %] 100 % (02/24 1215) Weight:  [79.2 kg] 79.2 kg (02/24 1016)  Weight change: -0.544 kg Filed Weights   03/25/18 0514 03/26/18 0500 03/26/18 1016  Weight: 79.8 kg 79.2 kg 79.2 kg    Intake/Output: I/O last 3 completed shifts: In: 1277.2 [I.V.:163.6; NG/GT:845; IV Piggyback:268.6] Out: 0    Intake/Output this shift:  No intake/output data recorded.  Physical Exam: General:  Critically ill-appearing, laying in the bed  Head:  Temporal muscle wasting  Eyes:  Eyes open, anicteric  Neck:  Supple  Lungs:   Normal breathing effort, CTAB  Heart:  Irregular rhythm  Abdomen:   Soft, nontender, +G-tube  Extremities:  Generalized anasarca  Neurologic:  Arousable, not following commands  Access:  RIJ permcath    Basic Metabolic Panel: Recent Labs  Lab 03/22/18 0512 03/23/18 1052  NA 136 136  K 4.1 4.4  CL 98 98  CO2 31 31  GLUCOSE 106* 293*  BUN 62* 66*  CREATININE 1.71* 1.92*  CALCIUM 8.3* 8.4*  MG 2.0  --   PHOS  --  2.1*    Liver Function Tests: Recent Labs  Lab 03/23/18 1052  ALBUMIN 2.3*   No results for input(s): LIPASE, AMYLASE in the last 168 hours. No  results for input(s): AMMONIA in the last 168 hours.  CBC: Recent Labs  Lab 03/22/18 0512 03/25/18 0911  WBC 6.8 9.1  HGB 7.8* 8.0*  HCT 26.9* 26.4*  MCV 107.6* 103.9*  PLT 127* 118*    Cardiac Enzymes: No results for input(s): CKTOTAL, CKMB, CKMBINDEX, TROPONINI in the last 168 hours.  BNP: Invalid input(s): POCBNP  CBG: Recent Labs  Lab 03/25/18 1650 03/25/18 2107 03/25/18 2306 03/26/18 0502 03/26/18 0740  GLUCAP 236* 239* 240* 100* 275*    Microbiology: Results for orders placed or performed during the hospital encounter of 02/26/18  MRSA PCR Screening     Status: Abnormal   Collection Time: 02/26/18  5:39 PM  Result Value Ref Range Status   MRSA by PCR POSITIVE (A) NEGATIVE Final    Comment:        The GeneXpert MRSA Assay (FDA approved for NASAL specimens only), is one component of a comprehensive MRSA colonization surveillance program. It is not intended to diagnose MRSA infection nor to guide or monitor treatment for MRSA infections. CRITICAL RESULT CALLED TO, READ BACK BY AND VERIFIED WITH:  CALLED TO DANA DUGGINS RN @1920  Performed at [facility], [address] [place], [place] [address]   CULTURE, BLOOD (ROUTINE X 2) w Reflex to ID Panel     Status: None   Collection Time: 03/06/18  9:01 PM  Result Value Ref Range Status   Specimen Description BLOOD LEFT ANTECUBITAL  Final  Special Requests   Final    BOTTLES DRAWN AEROBIC ONLY Blood Culture adequate volume   Culture   Final    NO GROWTH 5 DAYS Performed at [facility], [address] [place], [place] [address]    Report Status 03/11/2018 FINAL  Final  CULTURE, BLOOD (ROUTINE X 2) w Reflex to ID Panel     Status: None   Collection Time: 03/06/18  9:37 PM  Result Value Ref Range Status   Specimen Description BLOOD LEFT ANTECUBITAL  Final   Special Requests   Final    BOTTLES DRAWN AEROBIC AND ANAEROBIC Blood Culture adequate volume   Culture   Final    NO GROWTH 5  DAYS Performed at [facility], [address] [place], [place] [address]    Report Status 03/11/2018 FINAL  Final    Coagulation Studies: No results for input(s): LABPROT, INR in the last 72 hours.  Urinalysis: No results for input(s): COLORURINE, LABSPEC, PHURINE, GLUCOSEU, HGBUR, BILIRUBINUR, KETONESUR, PROTEINUR, UROBILINOGEN, NITRITE, LEUKOCYTESUR in the last 72 hours.  Invalid input(s): APPERANCEUR    Imaging: No results found.   Medications:   . sodium chloride Stopped (03/26/18 0008)  . albumin human 25 g (03/21/18 1256)  . ampicillin-sulbactam (UNASYN) IV Stopped (03/25/18 2349)  . feeding supplement (OSMOLITE 1.5 CAL) 1,000 mL (03/26/18 0050)   . atorvastatin  40 mg Per Tube q1800  . B-complex with vitamin C  1 tablet Per Tube Daily  . chlorhexidine  15 mL Mouth Rinse BID  . clopidogrel  75 mg Per Tube QHS  . collagenase   Topical Daily  . epoetin (EPOGEN/PROCRIT) injection  10,000 Units Intravenous Q M,W,F-HD  . famotidine  10 mg Per Tube Daily  . feeding supplement (PRO-STAT SUGAR FREE 64)  30 mL Per Tube BID  . free water  20 mL Per Tube Q4H  . insulin aspart  0-9 Units Subcutaneous Q4H  . mouth rinse  15 mL Mouth Rinse q12n4p  . midodrine  5 mg Oral BID WC  . multivitamin  15 mL Per Tube QHS  . nutrition supplement (JUVEN)  1 packet Per Tube BID BM   sodium chloride, acetaminophen (TYLENOL) oral liquid 160 mg/5 mL **OR** acetaminophen **OR** acetaminophen, albumin human, albuterol, ipratropium, lip balm, Melatonin, metoprolol tartrate, nitroGLYCERIN, ondansetron, polyethylene glycol, promethazine  Assessment/ Plan:  [name] is a 85 y.o. black female with diabetes mellitus type II, hypertension, hyperlipidemia, coronary disease status post CABG, history of stroke, CKD, was admitted on1/27/2020. Originally admitted to [place] hospitalwith Achalasiaand 50 pound weight loss, dysarthria since November 2019. Now transitioned to [facility] on  02/26/18 to initiate hemodialysis. First hemodialysis treatment on 1/28 through [facility].   1. End Stage Renal Disease with generalized edema   No signs of renal recovery.  - Family is desiring peritoneal dialysis on discharge.  PD catheter placement re-scheduled for Thursday however, patient is unlikely to be medically optimized for surgery   -  Patient seen and evaluated during hemodialysis today.  Tolerating well.  We plan to complete dialysis treatment today.  2. Hypotension: - continue midodrine with dialysis treatments.  3. Anemia of chronic kidney disease:  Macrocytic. Hemoglobin 8.0 macrocytic. with thrombocytopenia.  - maintain the patient on Epogen with dialysis treatments for now.  Most recent hemoglobin was 8.0.  4. Malnutrition with achalasia: G tube in place   Albumin remains low at 2.3 - IV albumin with HD treatment for oncotic support as necessary  5.  Aspiration pneumonia Treated with IV Unasyn.  Discontinued February 22 Management as per internal medicine team  6.  Cardiac history.  Atrial fibrillation.  CABG 05/2009, coronary angioplasty and stent 2004.  Severe chronic systolic CHF.  LVEF 25%.  Right ventricular systolic pressure 53 mm.  From echo February 2020     LOS: 28 Berea Majkowski 2/24/202012:25 PM

## 2018-03-26 NOTE — Progress Notes (Signed)
Pt assessed, 3+ edema overall. Pt moaning in bed. Called pt's name and pt did not respond. Provided pt with tylenol per PRN order via peg tube.

## 2018-03-26 NOTE — Progress Notes (Signed)
[name] at [place] NAME: [name]    MR#:  [id number]  DATE OF BIRTH:  [birth year]  SUBJECTIVE:  CHIEF COMPLAINT:  No chief complaint on file.  The patient is unresponsive to verbal stimuli. On HD. REVIEW OF SYSTEMS:  Review of Systems  Unable to perform ROS: Mental status change   DRUG ALLERGIES:   Allergies  Allergen Reactions  . Reglan [Metoclopramide]     Pt's son reports this causes patient tremors and neurologic issues   . Contrast Media [Iodinated Diagnostic Agents]     Sensitivity, kidney issues    VITALS:  Blood pressure 119/60, pulse 84, temperature (!) 79.9 F (26.6 C), temperature source Axillary, resp. rate 17, height 5\' 4"  (1.626 m), weight 78 kg, SpO2 100 %.  PHYSICAL EXAMINATION:  Physical Exam  GENERAL:  85 y.o.-year-old elderly patient lying in the bed with no acute distress.  EYES: Pupils equal, round, reactive to light . No scleral icterus.  HEENT: Head atraumatic, normocephalic.  NECK:  Supple, no jugular venous distention. No thyroid enlargement, no tenderness.  LUNGS: Bilateral rhonchi Right chest permacath present CARDIOVASCULAR: S1, S2 normal. No  rubs, or gallops.  ABDOMEN: Soft, nontender, nondistended. Bowel sounds present. No organomegaly or mass.  PEG tube in place EXTREMITIES: Anasarca.  No cyanosis, or clubbing.  NEUROLOGIC: Unable to exam. PSYCHIATRIC: The patient is lethargic and noncommunicative. SKIN:  Has a stage II pressure ulcer in the sacral area. LABORATORY PANEL:   CBC Recent Labs  Lab 03/25/18 0911  WBC 9.1  HGB 8.0*  HCT 26.4*  PLT 118*   ------------------------------------------------------------------------------------------------------------------  Chemistries  Recent Labs  Lab 03/22/18 0512 03/23/18 1052  NA 136 136  K 4.1 4.4  CL 98 98  CO2 31 31  GLUCOSE 106* 293*  BUN 62* 66*  CREATININE 1.71* 1.92*  CALCIUM 8.3* 8.4*  MG 2.0  --     ------------------------------------------------------------------------------------------------------------------  Cardiac Enzymes No results for input(s): TROPONINI in the last 168 hours. ------------------------------------------------------------------------------------------------------------------  RADIOLOGY:  No results found.  EKG:   Orders placed or performed during the hospital encounter of 02/26/18  . EKG 12-Lead  . EKG 12-Lead  . EKG 12-Lead  . EKG 12-Lead    ASSESSMENT AND PLAN:   [name] is an85 y.o.femalewith multiple comorbidities including diabetes type 2, hypertension, hyperlipidemia, coronary disease status post CABG, history of stroke, and CKD who was admitted on1/27/2020. [facility] [place] [facility] and achalasiaand 50 pound weight loss in 12/2017.Status post treatment for ESBL Klebsiella aspiration pneumonia, enterococcus UTI. Admitted for dialysis.  1.  Acute renal failure on CKD stage III- admitted with uremia and encephalopathy Progressed to end-stage renal disease - on hemodialysis.. Working on setting up outpatient dialysis.  Patient has a right chest permacath - Receiving IV albumin during dialysis as needed - Vascular has been consulted for possible PD catheter placement for early next week - Plan was to place PD catheter, but anesthesia canceled - as per them pt is not optimized enough. She is not a good candidate for PD per Dr. Candiss Norse. Dr. Holley Raring will talk to the patient's son about PD.  2. Acute on chronic combined heart failure-echo with moderate mitral regurgitation and bilateral  pleural effusion.  Presented with anasarca and hypoalbuminemia - Volume overload persists.  Should improve with dialysis hopefully - Patient has finished her antibiotics for aspiration pneumonia. - Continue oxygen. - 03/20/18 night, she had event of hypoxia, so started on antibiotic  for aspiration again for 5 days course.  Her  oxygen saturation is stable now.   3. Dysphagia secondary to achalasia- s/p Botox injections but causing pharyngeal muscle weakness. - Currently status post PEG tube and tube feeds.  4.  Diabetes mellitus-appreciate diabetes coordinator's input. On SSI.  5.  Left temporal and frontal CVA-on Plavix and statin  6.  Anemia-likely anemia of chronic disease . -transfusion if hemoglobin is less than 7. Continue EPO with dialysis.  7.  Acute encephalopathy-initially was in uremic encephalopathy with decreased mentation.  Now  confused.  Underlying delirium .  Continue to monitor. - needing phenergan PRN prior to dialysis as she is still screaming during dialysis - Family refusing haldol. - Pt does have changes due to Stroke.  Physical therapy consulted- patient was able to sit in a chair   Hypotension.  Patient was transferred to ICU due to hypotension.  On Midodrin. Blood pressure is better.  Very poor prognosis.  Continue treatment as requested by family.  High risk for deterioration.  Discussed with Dr. Holley Raring. All the records are reviewed and case discussed with Care Management/Social Worker Management plans discussed with the patient, family and they are in agreement.  CODE STATUS: Full code  TOTAL TIME TAKING CARE OF THIS PATIENT: 25 minutes.   Demetrios Loll M.D on 03/26/2018 at 2:04 PM  Between 7am to 6pm - Pager - [phone]  After 6pm go to [email or web address] - password [email or web address] [facility]  Office  [phone]  CC: Primary care physician; Deland Pretty, MD

## 2018-03-26 NOTE — Progress Notes (Signed)
PT Cancellation Note  Patient Details Name: [name] MRN: [id number] DOB: 06-15-1927   Cancelled Treatment:    Reason Eval/Treat Not Completed: Medical issues which prohibited therapy(Chart reviewed for attempted treatment session.  Per notes, patient with transfer to CCU over weekend due to hypotension (now returned to floor).  Per guidelines, will require new order to resume PT services after change in condition/transfer to higher level of care.  Please re-consult as medically appropriate.)   Clever Geraldo H. Owens Shark, PT, DPT, NCS 03/26/18, 9:06 AM [phone]

## 2018-03-26 NOTE — Progress Notes (Signed)
HD tx start    03/26/18 1025  Vital Signs  Pulse Rate 79  Pulse Rate Source Monitor  Resp (!) 22  BP (!) 143/60  BP Location Left Arm  BP Method Automatic  Patient Position (if appropriate) Sitting  Oxygen Therapy  SpO2 100 %  O2 Device Nasal Cannula  O2 Flow Rate (L/min) 2 L/min  During Hemodialysis Assessment  Blood Flow Rate (mL/min) 400 mL/min  Arterial Pressure (mmHg) -140 mmHg  Venous Pressure (mmHg) 130 mmHg  Transmembrane Pressure (mmHg) 50 mmHg  Ultrafiltration Rate (mL/min) 670 mL/min  Dialysate Flow Rate (mL/min) 600 ml/min  Conductivity: Machine  14  HD Safety Checks Performed Yes  Dialysis Fluid Bolus Normal Saline  Bolus Amount (mL) 250 mL  Intra-Hemodialysis Comments Tx initiated  Hemodialysis Catheter Right Subclavian Double-lumen;Permanent  Placement Date: 03/05/18   Orientation: Right  Access Location: Subclavian  Hemodialysis Catheter Type: Double-lumen;Permanent  Blue Lumen Status Infusing  Red Lumen Status Infusing

## 2018-03-26 NOTE — Progress Notes (Signed)
Pre HD assessment    03/26/18 1015  Neurological  Level of Consciousness Responds to Pain  Orientation Level Disoriented X4  Respiratory  Respiratory Pattern Regular;Unlabored  Chest Assessment Chest expansion symmetrical  Cardiac  Pulse Irregular  Cardiac Rhythm ST;Atrial fibrillation  Vascular  R Radial Pulse +1  L Radial Pulse +1  Edema Generalized;Right upper extremity;Left upper extremity;Right lower extremity;Left lower extremity  Integumentary  Integumentary (WDL) X  Skin Color Appropriate for ethnicity  Musculoskeletal  Musculoskeletal (WDL) X  Generalized Weakness Yes  Assistive Device None  GU Assessment  Genitourinary (WDL) X  Genitourinary Symptoms  (HD)  Psychosocial  Psychosocial (WDL) X  Patient Behaviors Not interactive

## 2018-03-26 NOTE — Progress Notes (Signed)
Post HD assessment    03/26/18 1335  Neurological  Level of Consciousness Responds to Pain  Orientation Level Disoriented X4  Respiratory  Respiratory Pattern Regular;Unlabored  Chest Assessment Chest expansion symmetrical  Cardiac  Pulse Irregular  Cardiac Rhythm Atrial fibrillation  Vascular  R Radial Pulse +1  L Radial Pulse +1  Edema Generalized;Left upper extremity;Right upper extremity;Right lower extremity;Left lower extremity  Integumentary  Integumentary (WDL) X  Skin Color Appropriate for ethnicity  Musculoskeletal  Musculoskeletal (WDL) X  Generalized Weakness Yes  Assistive Device None  GU Assessment  Genitourinary (WDL) X  Genitourinary Symptoms  (HD)  Psychosocial  Psychosocial (WDL) X  Patient Behaviors Not interactive

## 2018-03-26 NOTE — Progress Notes (Signed)
Nutrition Follow Up Note   DOCUMENTATION CODES:   Not applicable  INTERVENTION:   Continue Osmolite 1.5 @ goal rate of 13m/hr   Prostat 374mBID via tube   Free water flushes 2060m 4hrs  Regimen provides 1640kcal/day, 90g/day protein, 852m70mee water  Liquid MVI daily via tube  B-complex with C daily via tube  Juven Fruit Punch BID, each serving provides 95kcal and 2.5g of protein (amino acids glutamine and arginine)  Recommend monitor and replete phosphorus as needed.   NUTRITION DIAGNOSIS:   Inadequate oral intake related to dysphagia as evidenced by NPO status.  GOAL:   Patient will meet greater than or equal to 90% of their needs  -met with tube feeds  MONITOR:   Labs, Weight trends, TF tolerance, Skin, I & O's  ASSESSMENT:   89 y62. female with a known history of acute metabolic encephalopathy, combined systolic diastolic congestive heart failure moderate MR, acute CVA, ESBL Klebsiella aspiration pneumonia and enterococcus UTI, [facility]STEM[facility] weight loss and dysphagia due to achalasia. [facility]    Pt tolerating tube feeds well. Pt still receiving daily HD. Plan is for PD catheter placement as soon as patient is medically ready. Per chart, pt down ~20lbs since admit. Sacral wound still present; recommend continue supplements and vitamins. Pt with low P noted on 2/21; recommend monitor and replete as needed as pt likely still at refeed risk given the fact that her tube feeds have been intermittently held r/t son's request and daily HD.   Medications reviewed and include: B complex with C, plavix, epoetin, insulin, pepcid, MVI, protonix  Labs reviewed: K 4.4 wnl, BUN 66(H), creat 1.92(H), P 2.1(L)- 2/21 cbgs- 282, 275, 159 x 24 hrs Hgb 8.0(L), Hct 26.4(L), MCV 103.9(H)  Diet Order:   Diet Order            Diet NPO time specified Except for: Ice Chips, Sips with Meds   Diet effective midnight             EDUCATION NEEDS:   Not appropriate for education at this time  Skin:  Skin Assessment: Reviewed RN Assessment(unstageable wound buttocks, MASD )  Last BM:  2/24- TYPE 7  Height:   Ht Readings from Last 1 Encounters:  03/24/18 5' 4"  (1.626 m)    Weight:   Wt Readings from Last 1 Encounters:  03/26/18 78 kg    Ideal Body Weight:  52.3 kg  BMI:  Body mass index is 29.52 kg/m.  Estimated Nutritional Needs:   Kcal:  1400-1600kcal/day   Protein:  78-89g/day   Fluid:  UOP +1L  CaseKoleen Distance RD, LDN Pager #- [phone]#- [phone] Hours Pager: [phone]

## 2018-03-27 LAB — GLUCOSE, CAPILLARY
Glucose-Capillary: 178 mg/dL — ABNORMAL HIGH (ref 70–99)
Glucose-Capillary: 198 mg/dL — ABNORMAL HIGH (ref 70–99)
Glucose-Capillary: 218 mg/dL — ABNORMAL HIGH (ref 70–99)
Glucose-Capillary: 221 mg/dL — ABNORMAL HIGH (ref 70–99)
Glucose-Capillary: 242 mg/dL — ABNORMAL HIGH (ref 70–99)
Glucose-Capillary: 265 mg/dL — ABNORMAL HIGH (ref 70–99)
Glucose-Capillary: 269 mg/dL — ABNORMAL HIGH (ref 70–99)

## 2018-03-27 NOTE — Progress Notes (Signed)
Telemetry box was removed from patient last night for some reason by staff per nurse supervisor. The order has not been dc'd. I will ask prime doc today if pt still needs it.

## 2018-03-27 NOTE — Progress Notes (Signed)
[place] at [place] NAME: [name]    MR#:  [id number]  DATE OF BIRTH:  1927-05-23  SUBJECTIVE:  CHIEF COMPLAINT:  No chief complaint on file.  The patient opened eyes upon verbal stimuli.  Diarrhea 3 times today per RN.  But no fever or observed abdominal pain. REVIEW OF SYSTEMS:  Review of Systems  Unable to perform ROS: Mental status change   DRUG ALLERGIES:   Allergies  Allergen Reactions  . Reglan [Metoclopramide]     Pt's son reports this causes patient tremors and neurologic issues   . Contrast Media [Iodinated Diagnostic Agents]     Sensitivity, kidney issues    VITALS:  Blood pressure (!) 100/58, pulse (!) 58, temperature 98.6 F (37 C), temperature source Oral, resp. rate 20, height 5\' 4"  (1.626 m), weight 78 kg, SpO2 100 %.  PHYSICAL EXAMINATION:  Physical Exam  GENERAL:  85 y.o.-year-old elderly patient lying in the bed with no acute distress.  EYES: Pupils equal, round, reactive to light . No scleral icterus.  HEENT: Head atraumatic, normocephalic.  NECK:  Supple, no jugular venous distention. No thyroid enlargement, no tenderness.  LUNGS: Bilateral rhonchi Right chest permacath present CARDIOVASCULAR: S1, S2 normal. No  rubs, or gallops.  ABDOMEN: Soft, nontender, nondistended. Bowel sounds present. No organomegaly or mass.  PEG tube in place EXTREMITIES: Anasarca.  No cyanosis, or clubbing.  NEUROLOGIC: Unable to exam. PSYCHIATRIC: The patient is lethargic and noncommunicative. SKIN:  Has a stage II pressure ulcer in the sacral area. LABORATORY PANEL:   CBC Recent Labs  Lab 03/25/18 0911  WBC 9.1  HGB 8.0*  HCT 26.4*  PLT 118*   ------------------------------------------------------------------------------------------------------------------  Chemistries  Recent Labs  Lab 03/22/18 0512 03/23/18 1052  NA 136 136  K 4.1 4.4  CL 98 98  CO2 31 31  GLUCOSE 106* 293*  BUN 62* 66*  CREATININE  1.71* 1.92*  CALCIUM 8.3* 8.4*  MG 2.0  --    ------------------------------------------------------------------------------------------------------------------  Cardiac Enzymes No results for input(s): TROPONINI in the last 168 hours. ------------------------------------------------------------------------------------------------------------------  RADIOLOGY:  No results found.  EKG:   Orders placed or performed during the hospital encounter of 02/26/18  . EKG 12-Lead  . EKG 12-Lead  . EKG 12-Lead  . EKG 12-Lead    ASSESSMENT AND PLAN:   Contina Strain is an85 y.o.femalewith multiple comorbidities including diabetes type 2, hypertension, hyperlipidemia, coronary disease status post CABG, history of stroke, and CKD who was admitted on1/27/2020. [place] [facility] and achalasiaand 50 pound weight loss in 12/2017.Status post treatment for ESBL Klebsiella aspiration pneumonia, enterococcus UTI. Admitted for dialysis.  1.  Acute renal failure on CKD stage III- admitted with uremia and encephalopathy Progressed to end-stage renal disease - on hemodialysis.. Working on setting up outpatient dialysis.  Patient has a right chest permacath - Receiving IV albumin during dialysis as needed - Vascular has been consulted for possible PD catheter placement for early next week - Plan was to place PD catheter, but anesthesia canceled - as per them pt is not optimized enough. She is not a good candidate for PD per Dr. Candiss Norse. Dr. Holley Raring will talk to the patient's son, still wants PD. PD catheter insertion this Thursday.  2. Acute on chronic combined heart failure-echo with moderate mitral regurgitation and bilateral  pleural effusion.  Presented with anasarca and hypoalbuminemia - Volume overload persists.  Should improve with dialysis hopefully - Patient has finished  her antibiotics for aspiration pneumonia. - Continue oxygen. - 03/20/18 night, she  had event of hypoxia, so started on antibiotic for aspiration again for 5 days course.  Her oxygen saturation is stable now.   3. Dysphagia secondary to achalasia- s/p Botox injections but causing pharyngeal muscle weakness. - Currently status post PEG tube and tube feeds.  4.  Diabetes mellitus-appreciate diabetes coordinator's input. On SSI.  5.  Left temporal and frontal CVA-on Plavix and statin  6.  Anemia-likely anemia of chronic disease . -transfusion if hemoglobin is less than 7. Continue EPO with dialysis.  7.  Acute encephalopathy-initially was in uremic encephalopathy with decreased mentation.  Now  confused.  Underlying delirium .  Continue to monitor. - needing phenergan PRN prior to dialysis as she is still screaming during dialysis - Family refusing haldol. - Pt does have changes due to Stroke.  Physical therapy consulted- patient was able to sit in a chair   Hypotension.  Patient was transferred to ICU due to hypotension.  On Midodrin. Blood pressure is better.  Diarrhea.  Continue tube feeding.  Monitor.  Very poor prognosis.  Continue treatment as requested by family.  High risk for deterioration.  Discussed with Dr. Holley Raring. All the records are reviewed and case discussed with Care Management/Social Worker Management plans discussed with the patient, family and they are in agreement.  CODE STATUS: Full code  TOTAL TIME TAKING CARE OF THIS PATIENT: 25 minutes.   Demetrios Loll M.D on 03/27/2018 at 1:42 PM  Between 7am to 6pm - Pager - [phone]  After 6pm go to [email or web address] - password [email or web address] [facility]  Office  [phone]  CC: Primary care physician; Deland Pretty, MD

## 2018-03-27 NOTE — Progress Notes (Signed)
[facility] Daily Progress Note    Vascular Surgery Communication Note  Patient has been placed on the OR schedule for Laparoscopic Insertion of a Peritoneal Dialysis Catheter with Dr. Lucky Cowboy  (03/29/18). Patient will need to be medically optimized as per anesthesia Will pre-op tomorrow.  Discussed with Dr. Lucky Cowboy

## 2018-03-27 NOTE — Progress Notes (Signed)
Pt. has had several episodes of liquid stool. MD and registered Dietician were  notified. No new order at this time.

## 2018-03-27 NOTE — Progress Notes (Signed)
[facility]  ROUNDING NOTE   Subjective:  Patient lethargic but arousable.  Next line case discussed with vascular surgery. PD catheter to be considered again for Thursday. Next dialysis will be for tomorrow.   Objective:  Vital signs in last 24 hours:  Temp:  [97.5 F (36.4 C)-98.6 F (37 C)] 98.6 F (37 C) (02/25 1153) Pulse Rate:  [58-89] 58 (02/25 1153) Resp:  [18-20] 20 (02/25 1153) BP: (79-105)/(56-73) 100/58 (02/25 1153) SpO2:  [99 %-100 %] 100 % (02/25 1153)  Weight change: -0.043 kg Filed Weights   03/26/18 0500 03/26/18 1016 03/26/18 1335  Weight: 79.2 kg 79.2 kg 78 kg    Intake/Output: I/O last 3 completed shifts: In: 1129.1 [I.V.:84.1; NG/GT:845; IV Piggyback:200] Out: 8466 [Other:1515]   Intake/Output this shift:  No intake/output data recorded.  Physical Exam: General:  Critically ill-appearing, laying in the bed  Head:  Temporal muscle wasting  Eyes:  Anicteric  Neck:  Supple  Lungs:   Normal breathing effort, CTAB  Heart:  Irregular rhythm  Abdomen:   Soft, nontender, +G-tube  Extremities:  Generalized anasarca  Neurologic:  Arousable, not following commands  Access:  RIJ permcath    Basic Metabolic Panel: Recent Labs  Lab 03/22/18 0512 03/23/18 1052  NA 136 136  K 4.1 4.4  CL 98 98  CO2 31 31  GLUCOSE 106* 293*  BUN 62* 66*  CREATININE 1.71* 1.92*  CALCIUM 8.3* 8.4*  MG 2.0  --   PHOS  --  2.1*    Liver Function Tests: Recent Labs  Lab 03/23/18 1052  ALBUMIN 2.3*   No results for input(s): LIPASE, AMYLASE in the last 168 hours. No results for input(s): AMMONIA in the last 168 hours.  CBC: Recent Labs  Lab 03/22/18 0512 03/25/18 0911  WBC 6.8 9.1  HGB 7.8* 8.0*  HCT 26.9* 26.4*  MCV 107.6* 103.9*  PLT 127* 118*    Cardiac Enzymes: No results for input(s): CKTOTAL, CKMB, CKMBINDEX, TROPONINI in the last 168 hours.  BNP: Invalid input(s): POCBNP  CBG: Recent Labs  Lab 03/26/18 2358 03/27/18 0401  03/27/18 0438 03/27/18 0735 03/27/18 1148  GLUCAP 242* 198* 218* 265* 92*    Microbiology: Results for orders placed or performed during the hospital encounter of 02/26/18  MRSA PCR Screening     Status: Abnormal   Collection Time: 02/26/18  5:39 PM  Result Value Ref Range Status   MRSA by PCR POSITIVE (A) NEGATIVE Final    Comment:        The GeneXpert MRSA Assay (FDA approved for NASAL specimens only), is one component of a comprehensive MRSA colonization surveillance program. It is not intended to diagnose MRSA infection nor to guide or monitor treatment for MRSA infections. CRITICAL RESULT CALLED TO, READ BACK BY AND VERIFIED WITH:  CALLED TO DANA DUGGINS RN @1920  Performed at [facility], [address] [place], [place] [address]   CULTURE, BLOOD (ROUTINE X 2) w Reflex to ID Panel     Status: None   Collection Time: 03/06/18  9:01 PM  Result Value Ref Range Status   Specimen Description BLOOD LEFT ANTECUBITAL  Final   Special Requests   Final    BOTTLES DRAWN AEROBIC ONLY Blood Culture adequate volume   Culture   Final    NO GROWTH 5 DAYS Performed at [facility], [address], [place], [place] [address]    Report Status 03/11/2018 FINAL  Final  CULTURE, BLOOD (ROUTINE X 2)  w Reflex to ID Panel     Status: None   Collection Time: 03/06/18  9:37 PM  Result Value Ref Range Status   Specimen Description BLOOD LEFT ANTECUBITAL  Final   Special Requests   Final    BOTTLES DRAWN AEROBIC AND ANAEROBIC Blood Culture adequate volume   Culture   Final    NO GROWTH 5 DAYS Performed at [facility], [address] [place], [place] [address]    Report Status 03/11/2018 FINAL  Final    Coagulation Studies: No results for input(s): LABPROT, INR in the last 72 hours.  Urinalysis: No results for input(s): COLORURINE, LABSPEC, PHURINE, GLUCOSEU, HGBUR, BILIRUBINUR, KETONESUR, PROTEINUR, UROBILINOGEN, NITRITE, LEUKOCYTESUR in the last 72  hours.  Invalid input(s): APPERANCEUR    Imaging: No results found.   Medications:   . sodium chloride Stopped (03/26/18 0008)  . albumin human 25 g (03/21/18 1256)  . feeding supplement (OSMOLITE 1.5 CAL) 1,000 mL (03/27/18 0505)   . atorvastatin  40 mg Per Tube q1800  . B-complex with vitamin C  1 tablet Per Tube Daily  . chlorhexidine  15 mL Mouth Rinse BID  . clopidogrel  75 mg Per Tube QHS  . collagenase   Topical Daily  . epoetin (EPOGEN/PROCRIT) injection  10,000 Units Intravenous Q M,W,F-HD  . famotidine  10 mg Per Tube Daily  . feeding supplement (PRO-STAT SUGAR FREE 64)  30 mL Per Tube BID  . free water  20 mL Per Tube Q4H  . insulin aspart  0-9 Units Subcutaneous Q4H  . mouth rinse  15 mL Mouth Rinse q12n4p  . midodrine  5 mg Oral BID WC  . multivitamin  15 mL Per Tube QHS  . nutrition supplement (JUVEN)  1 packet Per Tube BID BM   sodium chloride, acetaminophen (TYLENOL) oral liquid 160 mg/5 mL **OR** acetaminophen **OR** acetaminophen, albumin human, albuterol, ipratropium, lip balm, Melatonin, metoprolol tartrate, nitroGLYCERIN, ondansetron, polyethylene glycol, promethazine  Assessment/ Plan:  Ms. Tamarra Geiselman is a 85 y.o. black female with diabetes mellitus type II, hypertension, hyperlipidemia, coronary disease status post CABG, history of stroke, CKD, was admitted on1/27/2020. Originally admitted to [place] [facility]with Achalasiaand 50 pound weight loss, dysarthria since November 2019. Now transitioned to [facility] on 02/26/18 to initiate hemodialysis. First hemodialysis treatment on 1/28 through [place].   1. End Stage Renal Disease with generalized edema   No signs of renal recovery.  - Family is desiring peritoneal dialysis on discharge.  PD catheter placement re-scheduled for Thursday however, patient is unlikely to be medically optimized for surgery   -  Case discussed with vascular surgery again today.  We will attempt to place a peritoneal  dialysis catheter if approved by anesthesia.  If anesthesia feels as the patient is still too unstable the plan will be to move forward with outpatient hemodialysis.  This was discussed and agreed to with the patient's son.  2. Hypotension: - maintain the patient on midodrine.  3. Anemia of chronic kidney disease:  Macrocytic. Hemoglobin 8.0 macrocytic. with thrombocytopenia.  - continue to monitor hemoglobin.  Maintain the patient on Epogen with dialysis treatments..  4. Malnutrition with achalasia: G tube in place   Albumin remains low at 2.3 - IV albumin with HD treatment for oncotic support as necessary  5.  Aspiration pneumonia Treated with IV Unasyn.  Discontinued February 22 Management as per internal medicine team  6.  Cardiac history.  Atrial fibrillation.  CABG 05/2009, coronary angioplasty and stent  2004.  Severe chronic systolic CHF.  LVEF 25%.  Right ventricular systolic pressure 53 mm.  From echo February 2020     LOS: 29 Ebbie Cherry 2/25/20202:29 PM

## 2018-03-28 LAB — GLUCOSE, CAPILLARY
GLUCOSE-CAPILLARY: 219 mg/dL — AB (ref 70–99)
Glucose-Capillary: 125 mg/dL — ABNORMAL HIGH (ref 70–99)
Glucose-Capillary: 188 mg/dL — ABNORMAL HIGH (ref 70–99)
Glucose-Capillary: 213 mg/dL — ABNORMAL HIGH (ref 70–99)
Glucose-Capillary: 223 mg/dL — ABNORMAL HIGH (ref 70–99)

## 2018-03-28 MED ORDER — MIDODRINE HCL 5 MG PO TABS
5.0000 mg | ORAL_TABLET | Freq: Three times a day (TID) | ORAL | Status: DC
  Administered 2018-03-28 – 2018-03-31 (×4): 5 mg via ORAL
  Filled 2018-03-28 (×10): qty 1

## 2018-03-28 MED ORDER — CEFAZOLIN SODIUM-DEXTROSE 1-4 GM/50ML-% IV SOLN
1.0000 g | INTRAVENOUS | Status: AC
  Administered 2018-03-29: 1 g via INTRAVENOUS
  Filled 2018-03-28: qty 50

## 2018-03-28 NOTE — Progress Notes (Signed)
[facility] NAME: [name]    MR#:  [id number]  DATE OF BIRTH:  1927/12/26  SUBJECTIVE:  Patient plan for anesthesia to evaluate for PD catheter placement  REVIEW OF SYSTEMS:  Review of Systems  Unable to perform ROS: Mental status change   DRUG ALLERGIES:   Allergies  Allergen Reactions  . Reglan [Metoclopramide]     Pt's son reports this causes patient tremors and neurologic issues   . Contrast Media [Iodinated Diagnostic Agents]     Sensitivity, kidney issues    VITALS:  Blood pressure 112/83, pulse 71, temperature 99.1 F (37.3 C), temperature source Oral, resp. rate 10, height 5\' 4"  (1.626 m), weight 78 kg, SpO2 100 %.  PHYSICAL EXAMINATION:  Physical Exam  GENERAL:  85 y.o.-year-old elderly patient lying in the bed with no acute distress.  In dialysis EYES: Pupils equal, round, reactive to light . No scleral icterus.  HEENT: Head atraumatic, normocephalic.  NECK:  Supple, no jugular venous distention. No thyroid enlargement, no tenderness.  LUNGS: Bilateral rhonchi Right chest permacath present CARDIOVASCULAR: S1, S2 normal. No  rubs, or gallops.  ABDOMEN: Soft, nontender, nondistended. Bowel sounds present. No organomegaly or mass.  PEG tube in place EXTREMITIES: Anasarca.  No cyanosis, or clubbing.  NEUROLOGIC: Unable to exam. SKIN:  Has a stage II pressure ulcer in the sacral area. LABORATORY PANEL:   CBC Recent Labs  Lab 03/25/18 0911  WBC 9.1  HGB 8.0*  HCT 26.4*  PLT 118*   ------------------------------------------------------------------------------------------------------------------  Chemistries  Recent Labs  Lab 03/22/18 0512 03/23/18 1052  NA 136 136  K 4.1 4.4  CL 98 98  CO2 31 31  GLUCOSE 106* 293*  BUN 62* 66*  CREATININE 1.71* 1.92*  CALCIUM 8.3* 8.4*  MG 2.0  --     ------------------------------------------------------------------------------------------------------------------  Cardiac Enzymes No results for input(s): TROPONINI in the last 168 hours. ------------------------------------------------------------------------------------------------------------------  RADIOLOGY:  No results found.  EKG:   Orders placed or performed during the hospital encounter of 02/26/18  . EKG 12-Lead  . EKG 12-Lead  . EKG 12-Lead  . EKG 12-Lead    ASSESSMENT AND PLAN:   [name] is an25 y.o.femalewith multiple comorbidities including diabetes type 2, hypertension, hyperlipidemia, coronary disease status post CABG, history of stroke, and CKD who was admitted on1/27/2020. Originally admitted to [place] hospitalwithdysphagia and achalasiaand 50 pound weight loss in 12/2017.Status post treatment for ESBL Klebsiella aspiration pneumonia, enterococcus UTI. Admitted for dialysis.  1.  Acute renal failure on CKD stage III- admitted with uremia and encephalopathy Progressed to end-stage renal disease Seizure evaluation for PD catheter If not feasible then will have hemodialysis   2. Acute on chronic combined heart failure-echo with moderate mitral regurgitation and bilateral  pleural effusion.  Presented with anasarca and hypoalbuminemia Continue dialysis  3. Dysphagia secondary to achalasia- s/p Botox injections but causing pharyngeal muscle weakness.  Currently status post PEG tube and tube feeds.  4.  Diabetes mellitus-appreciate diabetes coordinator's input. On SSI.  5.  Left temporal and frontal CVA-on Plavix and statin  6.  Anemia-likely anemia of chronic disease . -transfusion if hemoglobin is less than 7. Continue EPO with dialysis.  7.  Acute encephalopathy-initially was in uremic encephalopathy with decreased mentation.    - Family refusing haldol. - Pt does have changes due to Stroke.   Marland Kitchen  Hypotension.  Tinea Midodrin.  Blood pressure is better.   Very poor prognosis.  Continue treatment as requested by family.  High risk for deterioration.  Discussed with Dr. Holley Raring.   CODE STATUS: Full code  TOTAL TIME TAKING CARE OF THIS PATIENT: 20 minutes.   Marnette Perkins M.D on 03/28/2018 at 1:28 PM  Between 7am to 6pm - Pager - [phone]  After 6pm go to [email or web address] - password [email or web address] [facility]  Office  [phone]  CC: Primary care physician; Deland Pretty, MD

## 2018-03-28 NOTE — Progress Notes (Signed)
Post HD assessment    03/28/18 1405  Neurological  Level of Consciousness Responds to Voice  Orientation Level Disoriented X4  Respiratory  Respiratory Pattern Regular  Chest Assessment Chest expansion symmetrical  Bilateral Breath Sounds Clear;Diminished  Cough None  Cardiac  Pulse Irregular  Heart Sounds S1, S2  Jugular Venous Distention (JVD) No  ECG Monitor Yes  Vascular  R Radial Pulse +1  L Radial Pulse +1  Edema Generalized  Generalized Edema +2  Psychosocial  Psychosocial (WDL) WDL

## 2018-03-28 NOTE — Progress Notes (Signed)
[facility]  ROUNDING NOTE   Subjective:  Pt seen and evaluated during HD. Tolerating well. Anesthesia to evaluate pt to see if she can undergo general anesthesia.    Objective:  Vital signs in last 24 hours:  Temp:  [98 F (36.7 C)-99.1 F (37.3 C)] 99.1 F (37.3 C) (02/26 0434) Pulse Rate:  [58-87] 80 (02/26 0622) Resp:  [20] 20 (02/26 0434) BP: (81-117)/(55-67) 117/67 (02/26 0622) SpO2:  [100 %] 100 % (02/26 0434)  Weight change:  Filed Weights   03/26/18 0500 03/26/18 1016 03/26/18 1335  Weight: 79.2 kg 79.2 kg 78 kg    Intake/Output: No intake/output data recorded.   Intake/Output this shift:  No intake/output data recorded.  Physical Exam: General:  Critically ill-appearing, laying in the bed  Head:  Temporal muscle wasting  Eyes:  Anicteric  Neck:  Supple  Lungs:   Normal breathing effort, CTAB  Heart:  Irregular rhythm  Abdomen:   Soft, nontender, +G-tube  Extremities:  Generalized anasarca  Neurologic:  Arousable, not following commands  Access:  RIJ permcath    Basic Metabolic Panel: Recent Labs  Lab 03/22/18 0512 03/23/18 1052  NA 136 136  K 4.1 4.4  CL 98 98  CO2 31 31  GLUCOSE 106* 293*  BUN 62* 66*  CREATININE 1.71* 1.92*  CALCIUM 8.3* 8.4*  MG 2.0  --   PHOS  --  2.1*    Liver Function Tests: Recent Labs  Lab 03/23/18 1052  ALBUMIN 2.3*   No results for input(s): LIPASE, AMYLASE in the last 168 hours. No results for input(s): AMMONIA in the last 168 hours.  CBC: Recent Labs  Lab 03/22/18 0512 03/25/18 0911  WBC 6.8 9.1  HGB 7.8* 8.0*  HCT 26.9* 26.4*  MCV 107.6* 103.9*  PLT 127* 118*    Cardiac Enzymes: No results for input(s): CKTOTAL, CKMB, CKMBINDEX, TROPONINI in the last 168 hours.  BNP: Invalid input(s): POCBNP  CBG: Recent Labs  Lab 03/27/18 1618 03/27/18 2014 03/28/18 0027 03/28/18 0423 03/28/18 0746  GLUCAP 221* 178* 213* 188* 57*    Microbiology: Results for orders placed or  performed during the hospital encounter of 02/26/18  MRSA PCR Screening     Status: Abnormal   Collection Time: 02/26/18  5:39 PM  Result Value Ref Range Status   MRSA by PCR POSITIVE (A) NEGATIVE Final    Comment:        The GeneXpert MRSA Assay (FDA approved for NASAL specimens only), is one component of a comprehensive MRSA colonization surveillance program. It is not intended to diagnose MRSA infection nor to guide or monitor treatment for MRSA infections. CRITICAL RESULT CALLED TO, READ BACK BY AND VERIFIED WITH:  CALLED TO DANA DUGGINS RN @1920  Performed at [facility], [address] [place], [place] [address]   CULTURE, BLOOD (ROUTINE X 2) w Reflex to ID Panel     Status: None   Collection Time: 03/06/18  9:01 PM  Result Value Ref Range Status   Specimen Description BLOOD LEFT ANTECUBITAL  Final   Special Requests   Final    BOTTLES DRAWN AEROBIC ONLY Blood Culture adequate volume   Culture   Final    NO GROWTH 5 DAYS Performed at [facility], [address], [place], [place] [address]    Report Status 03/11/2018 FINAL  Final  CULTURE, BLOOD (ROUTINE X 2) w Reflex to ID Panel     Status: None   Collection Time: 03/06/18  9:37 PM  Result Value Ref Range Status   Specimen Description BLOOD LEFT ANTECUBITAL  Final   Special Requests   Final    BOTTLES DRAWN AEROBIC AND ANAEROBIC Blood Culture adequate volume   Culture   Final    NO GROWTH 5 DAYS Performed at [facility], [address] [place], [place] [address]    Report Status 03/11/2018 FINAL  Final    Coagulation Studies: No results for input(s): LABPROT, INR in the last 72 hours.  Urinalysis: No results for input(s): COLORURINE, LABSPEC, PHURINE, GLUCOSEU, HGBUR, BILIRUBINUR, KETONESUR, PROTEINUR, UROBILINOGEN, NITRITE, LEUKOCYTESUR in the last 72 hours.  Invalid input(s): APPERANCEUR    Imaging: No results found.   Medications:   . sodium chloride Stopped  (03/26/18 0008)  . albumin human 25 g (03/21/18 1256)  . [START ON 03/29/2018]  ceFAZolin (ANCEF) IV    . feeding supplement (OSMOLITE 1.5 CAL) 1,000 mL (03/28/18 0547)   . atorvastatin  40 mg Per Tube q1800  . B-complex with vitamin C  1 tablet Per Tube Daily  . chlorhexidine  15 mL Mouth Rinse BID  . clopidogrel  75 mg Per Tube QHS  . collagenase   Topical Daily  . epoetin (EPOGEN/PROCRIT) injection  10,000 Units Intravenous Q M,W,F-HD  . famotidine  10 mg Per Tube Daily  . feeding supplement (PRO-STAT SUGAR FREE 64)  30 mL Per Tube BID  . free water  20 mL Per Tube Q4H  . insulin aspart  0-9 Units Subcutaneous Q4H  . mouth rinse  15 mL Mouth Rinse q12n4p  . midodrine  5 mg Oral BID WC  . multivitamin  15 mL Per Tube QHS  . nutrition supplement (JUVEN)  1 packet Per Tube BID BM   sodium chloride, acetaminophen (TYLENOL) oral liquid 160 mg/5 mL **OR** acetaminophen **OR** acetaminophen, albumin human, albuterol, ipratropium, lip balm, Melatonin, metoprolol tartrate, nitroGLYCERIN, ondansetron, polyethylene glycol, promethazine  Assessment/ Plan:  [name] is a 85 y.o. black female with diabetes mellitus type II, hypertension, hyperlipidemia, coronary disease status post CABG, history of stroke, CKD, was admitted on1/27/2020. Originally admitted to [place] hospitalwith Achalasiaand 50 pound weight loss, dysarthria since November 2019. Now transitioned to [facility] on 02/26/18 to initiate hemodialysis. First hemodialysis treatment on 1/28 through [facility].   1. End Stage Renal Disease with generalized edema   No signs of renal recovery.  - Family is desiring peritoneal dialysis on discharge.  PD catheter placement re-scheduled for Thursday however, patient is unlikely to be medically optimized for surgery   -  Pt seen and evaluated during HD, tolerating well, complete HD today, anesthesia to evaluate pt to see if shes stable enough to undergo general anesthesia.    2.  Hypotension: - maintain the patient on midodrine.  3. Anemia of chronic kidney disease:  Macrocytic. Hemoglobin 8.0 macrocytic. with thrombocytopenia.  - continue epogen 10000 units IV with HD.   4. Malnutrition with achalasia: G tube in place   Albumin remains low at 2.3 - IV albumin with HD treatment for oncotic support as necessary  5.  Aspiration pneumonia Treated with IV Unasyn.  Discontinued February 22 Management as per internal medicine team  6.  Cardiac history.  Atrial fibrillation.  CABG 05/2009, coronary angioplasty and stent 2004.  Severe chronic systolic CHF.  LVEF 25%.  Right ventricular systolic pressure 53 mm.  From echo February 2020     LOS: 30 Hai Grabe 2/26/202011:07 AM

## 2018-03-28 NOTE — Progress Notes (Signed)
Anesthesia team will evaluate the patient during peri-op tomorrow for PD placement. Patient had dialysis today and sat in the chair the whole dialysis treatment.

## 2018-03-28 NOTE — Progress Notes (Signed)
Pre HD Assessment    03/28/18 1000  Neurological  Level of Consciousness Responds to Voice  Orientation Level Disoriented X4  Respiratory  Respiratory Pattern Regular  Chest Assessment Chest expansion symmetrical  Bilateral Breath Sounds Clear;Diminished  Cough None  Cardiac  Pulse Irregular  Heart Sounds S1, S2  Jugular Venous Distention (JVD) No  ECG Monitor Yes  Vascular  R Radial Pulse +1  L Radial Pulse +1  Psychosocial  Psychosocial (WDL) WDL

## 2018-03-28 NOTE — Progress Notes (Signed)
Post HD    03/28/18 1400  Hand-Off documentation  Report given to (Full Name) Verdis Prime, RN   Report received from (Full Name) Beatris Ship, RN   Vital Signs  Temp 98.4 F (36.9 C)  Temp Source Oral  Pulse Rate 79  Pulse Rate Source Monitor  Resp (!) 7  BP (!) 111/57  BP Location Left Arm  BP Method Automatic  Patient Position (if appropriate) Sitting  Oxygen Therapy  SpO2 100 %  O2 Device Nasal Cannula  O2 Flow Rate (L/min) 3 L/min  Pulse Oximetry Type Continuous  Pain Assessment  Pain Scale 0-10  Pain Score 0  Dialysis Weight  Weight  (unable to weigh )  Type of Weight Post-Dialysis  Post-Hemodialysis Assessment  Rinseback Volume (mL) 250 mL  KECN 82.1 V  Dialyzer Clearance Lightly streaked  Duration of HD Treatment -hour(s) 3.5 hour(s)  Hemodialysis Intake (mL) 500 mL  UF Total -Machine (mL) 3004 mL  Net UF (mL) 2504 mL  Tolerated HD Treatment Yes  Hemodialysis Catheter Right Subclavian Double-lumen;Permanent  Placement Date: 03/05/18   Orientation: Right  Access Location: Subclavian  Hemodialysis Catheter Type: Double-lumen;Permanent  Site Condition No complications  Blue Lumen Status Heparin locked  Red Lumen Status Heparin locked  Purple Lumen Status N/A  Post treatment catheter status Capped and Clamped  TX

## 2018-03-28 NOTE — Progress Notes (Signed)
HD Tx completed, tolerated well   03/28/18 1345  Vital Signs  Pulse Rate 80  Pulse Rate Source Monitor  Resp (!) 3  BP 119/79  BP Location Left Arm  BP Method Automatic  Patient Position (if appropriate) Sitting  Oxygen Therapy  SpO2 100 %  O2 Device Nasal Cannula  O2 Flow Rate (L/min) 3 L/min  Pulse Oximetry Type Continuous  During Hemodialysis Assessment  HD Safety Checks Performed Yes  KECN 82.1 KECN  Dialysis Fluid Bolus Normal Saline  Bolus Amount (mL) 250 mL  Intra-Hemodialysis Comments Tx completed;Tolerated well

## 2018-03-29 ENCOUNTER — Encounter: Payer: Self-pay | Admitting: Anesthesiology

## 2018-03-29 ENCOUNTER — Inpatient Hospital Stay: Payer: Medicare Other

## 2018-03-29 ENCOUNTER — Inpatient Hospital Stay: Payer: Medicare Other | Admitting: Anesthesiology

## 2018-03-29 ENCOUNTER — Inpatient Hospital Stay
Admission: RE | Disposition: A | Discharge status: Home Health | Payer: Self-pay | Source: Ambulatory Visit | Attending: Internal Medicine

## 2018-03-29 DIAGNOSIS — N186 End stage renal disease: Secondary | ICD-10-CM

## 2018-03-29 HISTORY — PX: CAPD INSERTION: SHX5233

## 2018-03-29 LAB — MAGNESIUM: Magnesium: 1.9 mg/dL (ref 1.7–2.4)

## 2018-03-29 LAB — BLOOD GAS, ARTERIAL
Acid-Base Excess: 2.7 mmol/L — ABNORMAL HIGH (ref 0.0–2.0)
Bicarbonate: 28.9 mmol/L — ABNORMAL HIGH (ref 20.0–28.0)
FIO2: 40
MECHVT: 450 mL
O2 SAT: 96 %
PEEP: 5 cmH2O
PO2 ART: 84 mmHg (ref 83.0–108.0)
Patient temperature: 37
RATE: 12 resp/min
pCO2 arterial: 50 mmHg — ABNORMAL HIGH (ref 32.0–48.0)
pH, Arterial: 7.37 (ref 7.350–7.450)

## 2018-03-29 LAB — APTT: aPTT: 24 seconds (ref 24–36)

## 2018-03-29 LAB — GLUCOSE, CAPILLARY
Glucose-Capillary: 110 mg/dL — ABNORMAL HIGH (ref 70–99)
Glucose-Capillary: 134 mg/dL — ABNORMAL HIGH (ref 70–99)
Glucose-Capillary: 137 mg/dL — ABNORMAL HIGH (ref 70–99)
Glucose-Capillary: 147 mg/dL — ABNORMAL HIGH (ref 70–99)
Glucose-Capillary: 157 mg/dL — ABNORMAL HIGH (ref 70–99)
Glucose-Capillary: 200 mg/dL — ABNORMAL HIGH (ref 70–99)
Glucose-Capillary: 54 mg/dL — ABNORMAL LOW (ref 70–99)
Glucose-Capillary: 56 mg/dL — ABNORMAL LOW (ref 70–99)
Glucose-Capillary: 73 mg/dL (ref 70–99)
Glucose-Capillary: 78 mg/dL (ref 70–99)

## 2018-03-29 LAB — BASIC METABOLIC PANEL
Anion gap: 9 (ref 5–15)
BUN: 58 mg/dL — ABNORMAL HIGH (ref 8–23)
CO2: 28 mmol/L (ref 22–32)
Calcium: 8.6 mg/dL — ABNORMAL LOW (ref 8.9–10.3)
Chloride: 100 mmol/L (ref 98–111)
Creatinine, Ser: 1.73 mg/dL — ABNORMAL HIGH (ref 0.44–1.00)
GFR calc Af Amer: 30 mL/min — ABNORMAL LOW (ref >60)
GFR calc non Af Amer: 26 mL/min — ABNORMAL LOW (ref >60)
GLUCOSE: 152 mg/dL — AB (ref 70–99)
Potassium: 4 mmol/L (ref 3.5–5.1)
Sodium: 137 mmol/L (ref 135–145)

## 2018-03-29 LAB — CBC
HEMATOCRIT: 29.6 % — AB (ref 36.0–46.0)
Hemoglobin: 8.4 g/dL — ABNORMAL LOW (ref 12.0–15.0)
MCH: 30.7 pg (ref 26.0–34.0)
MCHC: 28.4 g/dL — ABNORMAL LOW (ref 30.0–36.0)
MCV: 108 fL — ABNORMAL HIGH (ref 80.0–100.0)
Platelets: 142 10*3/uL — ABNORMAL LOW (ref 150–400)
RBC: 2.74 MIL/uL — ABNORMAL LOW (ref 3.87–5.11)
RDW: 19.3 % — ABNORMAL HIGH (ref 11.5–15.5)
WBC: 5.5 10*3/uL (ref 4.0–10.5)
nRBC: 0.4 % — ABNORMAL HIGH (ref 0.0–0.2)

## 2018-03-29 LAB — POCT I-STAT 4, (NA,K, GLUC, HGB,HCT)
Glucose, Bld: 152 mg/dL — ABNORMAL HIGH (ref 70–99)
HCT: 31 % — ABNORMAL LOW (ref 36.0–46.0)
Hemoglobin: 10.5 g/dL — ABNORMAL LOW (ref 12.0–15.0)
Potassium: 3.9 mmol/L (ref 3.5–5.1)
Sodium: 138 mmol/L (ref 135–145)

## 2018-03-29 LAB — PROTIME-INR
INR: 1.3 — ABNORMAL HIGH (ref 0.8–1.2)
Prothrombin Time: 16 seconds — ABNORMAL HIGH (ref 11.4–15.2)

## 2018-03-29 LAB — TYPE AND SCREEN
ABO/RH(D): O POS
Antibody Screen: NEGATIVE

## 2018-03-29 LAB — MRSA PCR SCREENING: MRSA BY PCR: POSITIVE — AB

## 2018-03-29 SURGERY — LAPAROSCOPIC INSERTION CONTINUOUS AMBULATORY PERITONEAL DIALYSIS  (CAPD) CATHETER
Anesthesia: General

## 2018-03-29 MED ORDER — DEXAMETHASONE SODIUM PHOSPHATE 10 MG/ML IJ SOLN
INTRAMUSCULAR | Status: AC
  Filled 2018-03-29: qty 1

## 2018-03-29 MED ORDER — PHENYLEPHRINE HCL 10 MG/ML IJ SOLN
INTRAMUSCULAR | Status: DC | PRN
  Administered 2018-03-29: 100 ug via INTRAVENOUS

## 2018-03-29 MED ORDER — DEXMEDETOMIDINE HCL 200 MCG/2ML IV SOLN
INTRAVENOUS | Status: DC | PRN
  Administered 2018-03-29: 20 ug via INTRAVENOUS

## 2018-03-29 MED ORDER — SODIUM CHLORIDE FLUSH 0.9 % IV SOLN
INTRAVENOUS | Status: AC
  Filled 2018-03-29: qty 10

## 2018-03-29 MED ORDER — DEXTROSE 50 % IV SOLN
25.0000 mL | Freq: Once | INTRAVENOUS | Status: AC
  Administered 2018-03-29: 25 mL via INTRAVENOUS
  Filled 2018-03-29: qty 50

## 2018-03-29 MED ORDER — SODIUM CHLORIDE 0.9 % IV SOLN
INTRAVENOUS | Status: DC
  Administered 2018-03-29: 15:00:00 via INTRAVENOUS

## 2018-03-29 MED ORDER — FENTANYL CITRATE (PF) 100 MCG/2ML IJ SOLN
INTRAMUSCULAR | Status: DC | PRN
  Administered 2018-03-29 (×2): 25 ug via INTRAVENOUS

## 2018-03-29 MED ORDER — FENTANYL CITRATE (PF) 100 MCG/2ML IJ SOLN
INTRAMUSCULAR | Status: AC
  Filled 2018-03-29: qty 2

## 2018-03-29 MED ORDER — ETOMIDATE 2 MG/ML IV SOLN
INTRAVENOUS | Status: DC | PRN
  Administered 2018-03-29: 12 mg via INTRAVENOUS

## 2018-03-29 MED ORDER — MUPIROCIN 2 % EX OINT
1.0000 "application " | TOPICAL_OINTMENT | Freq: Two times a day (BID) | CUTANEOUS | Status: DC
  Administered 2018-03-29 – 2018-04-03 (×9): 1 via NASAL
  Filled 2018-03-29 (×2): qty 22

## 2018-03-29 MED ORDER — ONDANSETRON HCL 4 MG/2ML IJ SOLN
INTRAMUSCULAR | Status: AC
  Filled 2018-03-29: qty 2

## 2018-03-29 MED ORDER — DEXMEDETOMIDINE HCL IN NACL 200 MCG/50ML IV SOLN
INTRAVENOUS | Status: AC
  Filled 2018-03-29: qty 50

## 2018-03-29 MED ORDER — DEXAMETHASONE SODIUM PHOSPHATE 10 MG/ML IJ SOLN
INTRAMUSCULAR | Status: DC | PRN
  Administered 2018-03-29: 4 mg via INTRAVENOUS

## 2018-03-29 MED ORDER — ONDANSETRON HCL 4 MG/2ML IJ SOLN
INTRAMUSCULAR | Status: DC | PRN
  Administered 2018-03-29: 4 mg via INTRAVENOUS

## 2018-03-29 MED ORDER — ETOMIDATE 2 MG/ML IV SOLN
INTRAVENOUS | Status: AC
  Filled 2018-03-29: qty 10

## 2018-03-29 MED ORDER — FENTANYL CITRATE (PF) 100 MCG/2ML IJ SOLN: 50.0000 ug | INTRAMUSCULAR | Status: DC | PRN

## 2018-03-29 MED ORDER — PHENYLEPHRINE HCL-NACL 10-0.9 MG/250ML-% IV SOLN
0.0000 ug/min | INTRAVENOUS | Status: DC
  Filled 2018-03-29: qty 250

## 2018-03-29 MED ORDER — FENTANYL CITRATE (PF) 100 MCG/2ML IJ SOLN
50.0000 ug | INTRAMUSCULAR | Status: DC | PRN
  Filled 2018-03-29: qty 2

## 2018-03-29 MED ORDER — VASOPRESSIN 20 UNIT/ML IV SOLN
INTRAVENOUS | Status: AC
  Filled 2018-03-29: qty 1

## 2018-03-29 MED ORDER — SUGAMMADEX SODIUM 200 MG/2ML IV SOLN
INTRAVENOUS | Status: AC
  Filled 2018-03-29: qty 2

## 2018-03-29 MED ORDER — ROCURONIUM BROMIDE 100 MG/10ML IV SOLN
INTRAVENOUS | Status: DC | PRN
  Administered 2018-03-29: 20 mg via INTRAVENOUS
  Administered 2018-03-29: 10 mg via INTRAVENOUS

## 2018-03-29 MED ORDER — SODIUM CHLORIDE 0.9 % IV SOLN: INTRAVENOUS | Status: DC | PRN

## 2018-03-29 MED ORDER — SUGAMMADEX SODIUM 200 MG/2ML IV SOLN
INTRAVENOUS | Status: DC | PRN
  Administered 2018-03-29: 160 mg via INTRAVENOUS

## 2018-03-29 MED ORDER — SODIUM CHLORIDE 0.9 % IV SOLN
0.0000 ug/min | INTRAVENOUS | Status: DC
  Filled 2018-03-29: qty 1

## 2018-03-29 SURGICAL SUPPLY — 39 items
"PENCIL ELECTRO HAND CTR " (MISCELLANEOUS) ×1 IMPLANT
ADAPTER BETA CAP QUINTON DIALY (ADAPTER) IMPLANT
ADAPTER CATH DIALYSIS 18.75 (CATHETERS) ×2 IMPLANT
ADAPTER CATH DIALYSIS 18.75CM (CATHETERS) ×1
ADH SKN CLS APL DERMABOND .7 (GAUZE/BANDAGES/DRESSINGS) ×1
ADPR DLYS BCP STRL PRTNL ULTEM (ADAPTER)
CANISTER SUCT 1200ML W/VALVE (MISCELLANEOUS) ×3 IMPLANT
CATH DLYS SWAN NECK 62.5CM (CATHETERS) ×3 IMPLANT
CHLORAPREP W/TINT 26ML (MISCELLANEOUS) ×3 IMPLANT
COVER WAND RF STERILE (DRAPES) ×3 IMPLANT
DERMABOND ADVANCED (GAUZE/BANDAGES/DRESSINGS) ×2
DERMABOND ADVANCED .7 DNX12 (GAUZE/BANDAGES/DRESSINGS) ×1 IMPLANT
ELECT CAUTERY BLADE 6.4 (BLADE) ×3 IMPLANT
ELECT REM PT RETURN 9FT ADLT (ELECTROSURGICAL) ×3
ELECTRODE REM PT RTRN 9FT ADLT (ELECTROSURGICAL) ×1 IMPLANT
GLOVE BIO SURGEON STRL SZ7 (GLOVE) ×6 IMPLANT
GLOVE INDICATOR 7.5 STRL GRN (GLOVE) ×3 IMPLANT
GOWN STRL REUS W/ TWL LRG LVL3 (GOWN DISPOSABLE) ×1 IMPLANT
GOWN STRL REUS W/ TWL XL LVL3 (GOWN DISPOSABLE) ×2 IMPLANT
GOWN STRL REUS W/TWL LRG LVL3 (GOWN DISPOSABLE) ×3
GOWN STRL REUS W/TWL XL LVL3 (GOWN DISPOSABLE) ×6
IV NS 500ML (IV SOLUTION) ×3
IV NS 500ML BAXH (IV SOLUTION) ×1 IMPLANT
KIT TURNOVER KIT A (KITS) ×3 IMPLANT
LABEL OR SOLS (LABEL) ×3 IMPLANT
MICROPUNCTURE 5FR NT-SST (MISCELLANEOUS) ×2 IMPLANT
MINICAP W/POVIDONE IODINE SOL (MISCELLANEOUS) ×3 IMPLANT
PACK LAP CHOLECYSTECTOMY (MISCELLANEOUS) ×3 IMPLANT
PENCIL ELECTRO HAND CTR (MISCELLANEOUS) ×3 IMPLANT
SET CYSTO W/LG BORE CLAMP LF (SET/KITS/TRAYS/PACK) ×3 IMPLANT
SET TRANSFER 6 W/TWIST CLAMP 5 (SET/KITS/TRAYS/PACK) ×3 IMPLANT
SET TUBE SMOKE EVAC HIGH FLOW (TUBING) ×3 IMPLANT
SPONGE DRAIN TRACH 4X4 STRL 2S (GAUZE/BANDAGES/DRESSINGS) ×3 IMPLANT
SPONGE VERSALON 4X4 4PLY (MISCELLANEOUS) ×3 IMPLANT
SUT MNCRL AB 4-0 PS2 18 (SUTURE) ×3 IMPLANT
SUT VIC AB 2-0 UR6 27 (SUTURE) ×3 IMPLANT
SUT VICRYL+ 3-0 36IN CT-1 (SUTURE) ×3 IMPLANT
TROCAR XCEL NON-BLD 11X100MML (ENDOMECHANICALS) ×3 IMPLANT
TROCAR XCEL NON-BLD 5MMX100MML (ENDOMECHANICALS) IMPLANT

## 2018-03-29 NOTE — Progress Notes (Signed)
Pt failed SBT TRAILS  PS=10 /PEEP 5

## 2018-03-29 NOTE — Op Note (Signed)
[facility]   OPERATIVE NOTE  DATE: 03/29/2018  PRE-OPERATIVE DIAGNOSIS: renal failure, unable to get adequate blood pressure monitoring for surgery  POST-OPERATIVE DIAGNOSIS: Same as above  PROCEDURE: 1.   left radial arterial line placement with ultrasound guidance  SURGEON: Leotis Pain  ASSISTANT(S): None  ANESTHESIA: None  ESTIMATED BLOOD LOSS: 5 cc  FINDING(S): 1.  extremely calcified radial artery  SPECIMEN(S):  None  INDICATIONS:   Patient is a 85 y.o.female who presents with renal failure and need to monitor her BP for surgical PD catheter placement. We are placing the arterial line for invasive hemodynamic monitoring and arterial access for blood draws and ABGs.  DESCRIPTION: [name] left wrist was prepped and draped. The left radial artery was visualized with ultrasound. This was found to be patent but densely calcified. It was then accessed with a mild amount of difficulty with a micropuncture needle and a permanent image was recorded. A micropuncture wire was then placed. A 5 French micropuncture sheath was then placed over the wire and the wire and dilator were removed. This was secured and connected to the appropriate monitors where a brisk arterial tracing was seen. The patient tolerated the procedure well without complications.   COMPLICATIONS: None  CONDITION: Stable   Leotis Pain  03/29/2018 4:19 PM  This note was created with Dragon Medical transcription system. Any errors in dictation are purely unintentional.

## 2018-03-29 NOTE — Progress Notes (Signed)
[facility]  ROUNDING NOTE   Subjective:  Patient completed hemodialysis yesterday. Peritoneal dialysis catheter to be placed today.    Objective:  Vital signs in last 24 hours:  Temp:  [98.3 F (36.8 C)-98.9 F (37.2 C)] 98.3 F (36.8 C) (02/27 1126) Pulse Rate:  [67-93] 93 (02/27 1432) Resp:  [16-20] 16 (02/27 1432) BP: (107-138)/(43-74) 107/43 (02/27 1432) SpO2:  [97 %-100 %] 97 % (02/27 1432)  Weight change:  Filed Weights   03/26/18 0500 03/26/18 1016 03/26/18 1335  Weight: 79.2 kg 79.2 kg 78 kg    Intake/Output: I/O last 3 completed shifts: In: 784 [NG/GT:784] Out: 2504 [Other:2504]   Intake/Output this shift:  No intake/output data recorded.  Physical Exam: General:  Critically ill-appearing, laying in the bed  Head:  Temporal muscle wasting  Eyes:  Anicteric  Neck:  Supple  Lungs:   Normal breathing effort, CTAB  Heart:  Irregular rhythm  Abdomen:   Soft, nontender, +G-tube  Extremities:  Generalized anasarca  Neurologic:  Arousable, not following commands  Access:  RIJ permcath    Basic Metabolic Panel: Recent Labs  Lab 03/23/18 1052 03/29/18 1108  NA 136 137  K 4.4 4.0  CL 98 100  CO2 31 28  GLUCOSE 293* 152*  BUN 66* 58*  CREATININE 1.92* 1.73*  CALCIUM 8.4* 8.6*  MG  --  1.9  PHOS 2.1*  --     Liver Function Tests: Recent Labs  Lab 03/23/18 1052  ALBUMIN 2.3*   No results for input(s): LIPASE, AMYLASE in the last 168 hours. No results for input(s): AMMONIA in the last 168 hours.  CBC: Recent Labs  Lab 03/25/18 0911 03/29/18 1108  WBC 9.1 5.5  HGB 8.0* 8.4*  HCT 26.4* 29.6*  MCV 103.9* 108.0*  PLT 118* 142*    Cardiac Enzymes: No results for input(s): CKTOTAL, CKMB, CKMBINDEX, TROPONINI in the last 168 hours.  BNP: Invalid input(s): POCBNP  CBG: Recent Labs  Lab 03/29/18 0533 03/29/18 0748 03/29/18 1212 03/29/18 1331 03/29/18 1344  GLUCAP 137* 110* 73 36* 134*    Microbiology: Results for  orders placed or performed during the hospital encounter of 02/26/18  MRSA PCR Screening     Status: Abnormal   Collection Time: 02/26/18  5:39 PM  Result Value Ref Range Status   MRSA by PCR POSITIVE (A) NEGATIVE Final    Comment:        The GeneXpert MRSA Assay (FDA approved for NASAL specimens only), is one component of a comprehensive MRSA colonization surveillance program. It is not intended to diagnose MRSA infection nor to guide or monitor treatment for MRSA infections. CRITICAL RESULT CALLED TO, READ BACK BY AND VERIFIED WITH:  CALLED TO DANA DUGGINS RN @1920  Performed at [facility], [address] [place], [place] [address]   CULTURE, BLOOD (ROUTINE X 2) w Reflex to ID Panel     Status: None   Collection Time: 03/06/18  9:01 PM  Result Value Ref Range Status   Specimen Description BLOOD LEFT ANTECUBITAL  Final   Special Requests   Final    BOTTLES DRAWN AEROBIC ONLY Blood Culture adequate volume   Culture   Final    NO GROWTH 5 DAYS Performed at [facility], [address], [place], [place] [address]    Report Status 03/11/2018 FINAL  Final  CULTURE, BLOOD (ROUTINE X 2) w Reflex to ID Panel     Status: None   Collection Time: 03/06/18  9:37  PM  Result Value Ref Range Status   Specimen Description BLOOD LEFT ANTECUBITAL  Final   Special Requests   Final    BOTTLES DRAWN AEROBIC AND ANAEROBIC Blood Culture adequate volume   Culture   Final    NO GROWTH 5 DAYS Performed at [facility], [address] [place], [place] [address]    Report Status 03/11/2018 FINAL  Final    Coagulation Studies: Recent Labs    03/29/18 1108  LABPROT 16.0*  INR 1.3*    Urinalysis: No results for input(s): COLORURINE, LABSPEC, PHURINE, GLUCOSEU, HGBUR, BILIRUBINUR, KETONESUR, PROTEINUR, UROBILINOGEN, NITRITE, LEUKOCYTESUR in the last 72 hours.  Invalid input(s): APPERANCEUR    Imaging: X-ray Chest Pa Or Ap  Result Date: 03/29/2018 CLINICAL  DATA:  Preoperative exam. EXAM: CHEST  1 VIEW COMPARISON:  03/20/2018 FINDINGS: Large bore catheter in stable position, tip at the expected location of the cavoatrial junction. Postsurgical changes of CABG. Enlarged cardiac silhouette. Calcific atherosclerotic disease of the aorta. Bilateral pleural effusions, moderate with interstitial pulmonary edema. Osseous structures are without acute abnormality. Soft tissues are grossly normal. IMPRESSION: Enlarged cardiac silhouette with moderate interstitial pulmonary edema and bilateral pleural effusions. Electronically Signed   By: Fidela Salisbury M.D.   On: 03/29/2018 13:28     Medications:   . [MAR Hold] sodium chloride Stopped (03/26/18 0008)  . sodium chloride 10 mL/hr at 03/29/18 1443  . [MAR Hold] albumin human 25 g (03/21/18 1256)  . [MAR Hold]  ceFAZolin (ANCEF) IV    . feeding supplement (OSMOLITE 1.5 CAL) 1,000 mL (03/28/18 0547)   . [MAR Hold] atorvastatin  40 mg Per Tube q1800  . [MAR Hold] B-complex with vitamin C  1 tablet Per Tube Daily  . [MAR Hold] chlorhexidine  15 mL Mouth Rinse BID  . [MAR Hold] clopidogrel  75 mg Per Tube QHS  . [MAR Hold] collagenase   Topical Daily  . [MAR Hold] epoetin (EPOGEN/PROCRIT) injection  10,000 Units Intravenous Q M,W,F-HD  . [MAR Hold] famotidine  10 mg Per Tube Daily  . [MAR Hold] feeding supplement (PRO-STAT SUGAR FREE 64)  30 mL Per Tube BID  . [MAR Hold] free water  20 mL Per Tube Q4H  . [MAR Hold] insulin aspart  0-9 Units Subcutaneous Q4H  . [MAR Hold] mouth rinse  15 mL Mouth Rinse q12n4p  . [MAR Hold] midodrine  5 mg Oral TID WC  . [MAR Hold] multivitamin  15 mL Per Tube QHS  . [MAR Hold] nutrition supplement (JUVEN)  1 packet Per Tube BID BM  . sodium chloride flush       [MAR Hold] sodium chloride, [MAR Hold] acetaminophen (TYLENOL) oral liquid 160 mg/5 mL **OR** [MAR Hold] acetaminophen **OR** [MAR Hold] acetaminophen, [MAR Hold] albumin human, [MAR Hold] albuterol, [MAR Hold]  ipratropium, [MAR Hold] lip balm, [MAR Hold] Melatonin, [MAR Hold] metoprolol tartrate, [MAR Hold] nitroGLYCERIN, [MAR Hold] ondansetron, [MAR Hold] polyethylene glycol, [MAR Hold] promethazine  Assessment/ Plan:  [name] is a 85 y.o. black female with diabetes mellitus type II, hypertension, hyperlipidemia, coronary disease status post CABG, history of stroke, CKD, was admitted on1/27/2020. Originally admitted to [facility] Achalasiaand 50 pound weight loss, dysarthria since November 2019. Now transitioned to [facility] on 02/26/18 to initiate hemodialysis. First hemodialysis treatment on 1/28 through [facility].   1. End Stage Renal Disease with generalized edema   No signs of renal recovery.  - Family is desiring peritoneal dialysis on discharge.      -  Patient due for peritoneal dialysis catheter placement today.  Once this has been performed we can proceed with urgent start peritoneal dialysis as an outpatient.  2. Hypotension: - maintain the patient on midodrine.  3. Anemia of chronic kidney disease:  Macrocytic. Hemoglobin 8.0 macrocytic. with thrombocytopenia.  - maintain the patient on Epogen.  4. Malnutrition with achalasia: G tube in place   Albumin remains low at 2.3 -  Continue to monitor nutritional parameters, remains on TFs.   5.  Aspiration pneumonia Treated with IV Unasyn.  Discontinued February 22 Management as per internal medicine team  6.  Cardiac history.  Atrial fibrillation.  CABG 05/2009, coronary angioplasty and stent 2004.  Severe chronic systolic CHF.  LVEF 25%.  Right ventricular systolic pressure 53 mm.  From echo February 2020     LOS: 31 Elvenia Godden 2/27/20202:48 PM

## 2018-03-29 NOTE — Care Management (Addendum)
RNCM confirmed with Wanda that reclining RW has been delivered to patient's home.   Plan remains for patient to discharge home with [facility] home [facility] at discharge  Copy of IM sent to HIM as first one does not appear to have been scanned in

## 2018-03-29 NOTE — Op Note (Signed)
  OPERATIVE NOTE   PROCEDURE: 1. Laparoscopic peritoneal dialysis catheter placement.  PRE-OPERATIVE DIAGNOSIS: 1. ESRD  POST-OPERATIVE DIAGNOSIS: Same  SURGEON: Leotis Pain, MD  ASSISTANT(S): Hezzie Bump, PA-C  ANESTHESIA: general  ESTIMATED BLOOD LOSS: Minimal   FINDING(S): 1. None  SPECIMEN(S): None  INDICATIONS:  Patient presents with renal failure. [name] family has decided to do peritoneal dialysis for his long-term dialysis. Risks and benefits of placement were discussed and he is agreeable to proceed.  Differences between peritoneal dialysis and hemodialysis were discussed.  An assistant was present during the procedure to help facilitate the exposure and expedite the procedure.  DESCRIPTION: After obtaining full informed written consent, the patient was brought back to the operating room and placed supine upon the operating table. The patient received IV antibiotics prior to induction. After obtaining adequate anesthesia, the abdomen was prepped and draped in the standard fashion. The assistant provided retraction and mobilization to help facilitate exposure and expedite the procedure throughout the entire procedure.  This included following suture, using retractors, and optimizing lighting. A small transverse incision was created just to the left of the umbilicus and we dissected down to the fascia and placed a pursestring Vicryl suture. I then entered the peritoneum with an 31mm Optiview trocar placed in the right upper quadrant and insufflated the abdomen with carbon dioxide. I then entered the peritoneum just beside the umbilicus with a trocar and the peritoneal dialysis catheter under direct visualization. The coiled portion of the catheter was parked into the pelvis under direct laparoscopic guidance. The deep cuff was secured to the fascial pursestring suture. A small counterincision was made in the left abdomen and the catheter was brought out this site. The  appropriate distal connectors were placed, and I then placed 500 cc of saline through the catheter into the pelvis. The abdomen was desufflated. Immediately, over 400 cc of effluent returned through the catheter when the bag was placed to gravity. I took one more look with the camera to ensure that the catheter was in the pelvis and it was. The 61mm trocar was then removed. I then closed the incisions with 3-0 Vicryl and 4-0 Monocryl and placed Dermabond as dressing. Dry dressing was placed around the catheter exit site. The patient was then awakened from anesthesia and taken to the recovery room in stable condition having tolerated the procedure well.  COMPLICATIONS: None  CONDITION: None  Leotis Pain, MD 03/29/2018 4:56 PM   This note was created with Dragon Medical transcription system. Any errors in dictation are purely unintentional.

## 2018-03-29 NOTE — Anesthesia Procedure Notes (Signed)
Procedure Name: Intubation Date/Time: 03/29/2018 4:21 PM Performed by: Jonna Clark, CRNA Pre-anesthesia Checklist: Patient identified, Patient being monitored, Timeout performed, Emergency Drugs available and Suction available Patient Re-evaluated:Patient Re-evaluated prior to induction Oxygen Delivery Method: Circle system utilized Preoxygenation: Pre-oxygenation with 100% oxygen Induction Type: IV induction Ventilation: Mask ventilation without difficulty Laryngoscope Size: Mac and 3 Grade View: Grade I Tube type: Oral Tube size: 7.0 mm Number of attempts: 1 Placement Confirmation: ETT inserted through vocal cords under direct vision,  positive ETCO2 and breath sounds checked- equal and bilateral Secured at: 21 cm Tube secured with: Tape Dental Injury: Teeth and Oropharynx as per pre-operative assessment

## 2018-03-29 NOTE — Anesthesia Post-op Follow-up Note (Signed)
Anesthesia QCDR form completed.        

## 2018-03-29 NOTE — Anesthesia Procedure Notes (Signed)
Arterial Line Insertion Start/End2/27/2020 4:11 PM Performed by: Gunnar Fusi, MD, Jonna Clark, CRNA, attending  Preanesthetic checklist: patient identified, IV checked, site marked, risks and benefits discussed, surgical consent, monitors and equipment checked, pre-op evaluation, timeout performed and anesthesia consent radial was placed Catheter size: 20 G Hand hygiene performed  and maximum sterile barriers used   Attempts: 4 Procedure performed using ultrasound guided technique. Ultrasound Notes:needle tip was noted to be adjacent to the nerve/plexus identified Following insertion, dressing applied. Post procedure assessment: unchanged  Patient tolerated the procedure well with no immediate complications. Additional procedure comments: Surgeon performed micro puncture to access arterial line. Marland Kitchen

## 2018-03-29 NOTE — H&P (Signed)
[facility] History & Physical Update  The patient was interviewed and re-examined.  [name] previous History and Physical has been reviewed and is unchanged. She is a very high risk patient for even a minor procedure such as a PD catheter, but this would be necessary for outpatient dialysis at some point and that has been the family's wish. There is no change in the plan of care. We plan to proceed with the scheduled procedure.  Leotis Pain, MD  03/29/2018, 2:44 PM

## 2018-03-29 NOTE — Progress Notes (Signed)
Report called to ICU Rn [place]. Belongings and meds gathered and taken to ICU

## 2018-03-29 NOTE — Progress Notes (Signed)
CRITICAL CARE NOTE  CC  respiratory failure  SUBJECTIVE  85 yo AAF post op PD cath placement Unable to wean from vent  On PRVC mode On full vent support   Patient remains critically ill Prognosis is guarded   Previously admitted for progressive renal function Transferred from [facility] to [facility] for starting HD     SIGNIFICANT EVENTS history of acute metabolic encephalopathy combined systolic diastolic congestive heart failure moderate MR acute CVA, ESBL Klebsiella aspiration pneumonia and enterococcus UTI acute non-STEMI was admitted at [facility]  Patient had a very complicated course at [facility] over the last couple months.  transferred to [facility] regional for trial of hemodialysis   REVIEW OF SYSTEMS  PATIENT IS UNABLE TO PROVIDE COMPLETE REVIEW OF SYSTEMS DUE TO SEVERE CRITICAL ILLNESS   PHYSICAL EXAMINATION:  GENERAL:critically ill appearing, +resp distress HEAD: Normocephalic, atraumatic.  EYES: Pupils equal, round, reactive to light.  No scleral icterus.  MOUTH: Moist mucosal membrane. NECK: Supple. No thyromegaly. No nodules. No JVD.  PULMONARY: +rhonchi, +wheezing CARDIOVASCULAR: S1 and S2. Regular rate and rhythm. No murmurs, rubs, or gallops.  GASTROINTESTINAL: Soft, nontender, -distended. No masses. Positive bowel sounds. No hepatosplenomegaly.  MUSCULOSKELETAL: No swelling, clubbing, or edema.  NEUROLOGIC: obtunded, GCS<8 SKIN:intact,warm,dry    CULTURE RESULTS   No results found for this or any previous visit (from the past 240 hour(s)).        IMAGING    X-ray Chest Pa Or Ap  Result Date: 03/29/2018 CLINICAL DATA:  Preoperative exam. EXAM: CHEST  1 VIEW COMPARISON:  03/20/2018 FINDINGS: Large bore catheter in stable position, tip at the expected location of the cavoatrial junction. Postsurgical changes of CABG. Enlarged cardiac silhouette. Calcific atherosclerotic disease of the aorta. Bilateral pleural effusions, moderate with interstitial  pulmonary edema. Osseous structures are without acute abnormality. Soft tissues are grossly normal. IMPRESSION: Enlarged cardiac silhouette with moderate interstitial pulmonary edema and bilateral pleural effusions. Electronically Signed   By: Fidela Salisbury M.D.   On: 03/29/2018 13:28      Indwelling Urinary Catheter continued, requirement due to   Reason to continue Indwelling Urinary Catheter for strict Intake/Output monitoring for hemodynamic instability         Ventilator continued, requirement due to, resp failure    Ventilator Sedation RASS 0 to -2     ASSESSMENT AND PLAN SYNOPSIS   Severe Hypoxic and Hypercapnic Respiratory Failure -continue Full MV support -continue Bronchodilator Therapy -Wean Fio2 and PEEP as tolerated -will perform SAT/SBT when respiratory parameters are met   Renal Failure- -follow chem 7 -follow UO -continue Foley Catheter-assess need daily S/p PD placement   NEUROLOGY - intubated  No sedation needed, plan for extubation Wake up assessment pending   CARDIAC ICU monitoring  ID -continue IV abx as prescibed -follow up cultures  GI/Nutrition GI PROPHYLAXIS as indicated DIET-->TF's as tolerated VIA PEG Constipation protocol as indicated  ENDO - ICU hypoglycemic\Hyperglycemia protocol -check FSBS per protocol   ELECTROLYTES -follow labs as needed -replace as needed -pharmacy consultation and following   DVT/GI PRX ordered TRANSFUSIONS AS NEEDED MONITOR FSBS ASSESS the need for LABS as needed   Critical Care Time devoted to patient care services described in this note is 31  minutes.   Overall, patient is critically ill, prognosis is guarded.  Patient with Multiorgan failure and at high risk for cardiac arrest and death.    Corrin Parker, M.D.  [facility]  Medical Director [place] Director  [facility] Cardio-Pulmonary Department

## 2018-03-29 NOTE — Transfer of Care (Signed)
Immediate Anesthesia Transfer of Care Note  Patient: [name]  Procedure(s) Performed: LAPAROSCOPIC INSERTION CONTINUOUS AMBULATORY PERITONEAL DIALYSIS  (CAPD) CATHETER (N/A ) ARTERIAL LINE INSERTION (N/A )  Patient Location: ICU  Anesthesia Type:General  Level of Consciousness: sedated and responds to stimulation  Airway & Oxygen Therapy: Patient remains intubated per anesthesia plan  Post-op Assessment: Report given to RN  Post vital signs: Reviewed and stable  Last Vitals:  Vitals Value Taken Time  BP 123/64 03/29/2018  5:35 PM  Temp    Pulse 60 03/29/2018  5:36 PM  Resp 16 03/29/2018  5:36 PM  SpO2 97 % 03/29/2018  5:36 PM  Vitals shown include unvalidated device data.  Last Pain:  Vitals:   03/29/18 1432  TempSrc:   PainSc: 0-No pain      Patients Stated Pain Goal: 0 (90/24/09 7353)  Complications: No apparent anesthesia complications

## 2018-03-29 NOTE — Progress Notes (Signed)
[place] [facility] [place] NAME: [name]    MR#:  [id number]  DATE OF BIRTH:  1927-09-10  SUBJECTIVE:  Still awaiting anesthesia to evaluate patient for PD catheter placement  REVIEW OF SYSTEMS:  Review of Systems  Unable to perform ROS: Mental status change   DRUG ALLERGIES:   Allergies  Allergen Reactions  . Reglan [Metoclopramide]     Pt's son reports this causes patient tremors and neurologic issues   . Contrast Media [Iodinated Diagnostic Agents]     Sensitivity, kidney issues    VITALS:  Blood pressure 121/68, pulse 83, temperature 98.9 F (37.2 C), temperature source Oral, resp. rate 18, height 5\' 4"  (1.626 m), weight 78 kg, SpO2 100 %.  PHYSICAL EXAMINATION:  Physical Exam  GENERAL:  85 y.o.-year-old elderly patient lying in the bed with no acute distress. Non verbal this am EYES: Pupils equal, round, reactive to light . No scleral icterus.  HEENT: Head atraumatic, normocephalic.  NECK:  Supple, no jugular venous distention. No thyroid enlargement, no tenderness.  LUNGS: CTAB Right chest permacath present CARDIOVASCULAR: S1, S2 normal. No  rubs, or gallops.  ABDOMEN: Soft, nontender, nondistended. Bowel sounds present. No organomegaly or mass.  PEG tube in place EXTREMITIES: Anasarca.  No cyanosis, or clubbing.  NEUROLOGIC: Unable to exam. SKIN:  Has a stage II pressure ulcer in the sacral area. LABORATORY PANEL:   CBC Recent Labs  Lab 03/25/18 0911  WBC 9.1  HGB 8.0*  HCT 26.4*  PLT 118*   ------------------------------------------------------------------------------------------------------------------  Chemistries  Recent Labs  Lab 03/23/18 1052  NA 136  K 4.4  CL 98  CO2 31  GLUCOSE 293*  BUN 66*  CREATININE 1.92*  CALCIUM 8.4*   ------------------------------------------------------------------------------------------------------------------  Cardiac Enzymes No results for input(s):  TROPONINI in the last 168 hours. ------------------------------------------------------------------------------------------------------------------  RADIOLOGY:  No results found.  EKG:   Orders placed or performed during the hospital encounter of 02/26/18  . EKG 12-Lead  . EKG 12-Lead  . EKG 12-Lead  . EKG 12-Lead    ASSESSMENT AND PLAN:   [name] is an66 y.o.femalewith multiple comorbidities including diabetes type 2, hypertension, hyperlipidemia, coronary disease status post CABG, history of stroke, and CKD who was admitted on1/27/2020. Originally admitted to [facility] hospitalwithdysphagia and achalasiaand 50 pound weight loss in 12/2017.Status post treatment for ESBL Klebsiella aspiration pneumonia, enterococcus UTI. Admitted for dialysis.  1.  Acute renal failure on CKD stage III- admitted with uremia and encephalopathy Progressed to end-stage renal disease Anesthesia evaluation for PD catheter If not feasible then will have hemodialysis   2. Acute on chronic combined heart failure-echo with moderate mitral regurgitation and bilateral  pleural effusion.  Presented with anasarca and hypoalbuminemia Continue dialysis  3. Dysphagia secondary to achalasia- s/p Botox injections but causing pharyngeal muscle weakness.  Currently status post PEG tube and tube feeds.  4.  Diabetes mellitus-appreciate diabetes coordinator's input. On SSI.  5.  Left temporal and frontal CVA-on Plavix and statin  6.  Anemia-likely anemia of chronic disease . -transfusion if hemoglobin is less than 7. Continue EPO with dialysis.  7.  Acute encephalopathy-initially was in uremic encephalopathy with decreased mentation.    - Family refusing haldol.    8  Hypotension: Continue Midodrin. Blood pressure is better.   Overall poor prognosis.  Continue treatment as requested by family.  High risk for deterioration.  Discussed with nursing   CODE STATUS: Full code  TOTAL TIME  TAKING CARE OF THIS PATIENT: 20 minutes.   Clem Wisenbaker M.D on 03/29/2018 at 11:17 AM  Between 7am to 6pm - Pager - [phone]  After 6pm go to [email or web address] - password [email or web address] [facility]  Office  [phone]  CC: Primary care physician; Deland Pretty, MD

## 2018-03-29 NOTE — Progress Notes (Signed)
Pt is awake and alert, following some simple commands.  Placed in Pressure Support 10/5 for SBT.  However pt with small tidal volumes (200's) and with periods of apnea.  Pt has failed SBT tonight.  Would like to avoid sedation if possible to allow for SBT again in the morning.  Spoke with nursing to place mitts and soft wrist restraints if needed if pt were to begin reaching for ETT.  If pt becomes severely agitated, uncomfortable, or dyssynronous with the vent, then orders placed for prn fentanyl pushes.   Darel Hong, AGACNP-BC [facility] Pager: [phone] Cell: [phone]

## 2018-03-29 NOTE — Anesthesia Preprocedure Evaluation (Addendum)
Anesthesia Evaluation  Patient identified by MRN, date of birth, ID band Patient awake    Reviewed: Allergy & Precautions, NPO status , Patient's Chart, lab work & pertinent test results  Airway Mallampati: II  TM Distance: >3 FB Neck ROM: Full    Dental  (+) Edentulous Upper, Dental Advisory Given   Pulmonary shortness of breath and with exertion, pneumonia,    breath sounds clear to auscultation       Cardiovascular hypertension, Pt. on medications and Pt. on home beta blockers + angina + CAD, + Past MI, + Cardiac Stents, + CABG and +CHF   Rhythm:Regular Rate:Normal     Neuro/Psych  Neuromuscular disease CVA, Residual Symptoms negative psych ROS   GI/Hepatic Neg liver ROS,   Endo/Other  diabetes, Type 2, Oral Hypoglycemic Agents  Renal/GU Renal InsufficiencyRenal disease  negative genitourinary   Musculoskeletal negative musculoskeletal ROS (+)   Abdominal Normal abdominal exam  (+)   Peds negative pediatric ROS (+)  Hematology  (+) anemia ,   Anesthesia Other Findings Past Medical History: 07/2016: Acute renal injury ([place]) 2004; 2011: Anginal pain ([place]) No date: CAD (coronary artery disease)     Comment:  status post stenting of the RCA status post CABG. (left               internal mammary artery to  LAD, saphenous vein graft to               second diagonal, saphenous vein  graft to obtuse marginal              1, saphenous vein graft to posterior  descending). No date: Cardiomyopathy     Comment:   Mildly reduced EF (45%).  No date: Chronic kidney disease (CKD), stage III (moderate) ([place]) 1960s: History of blood transfusion     Comment:  "related to anemia" No date: HTN (hypertension) 2011: Pleural effusion     Comment:  S/P "bypass" No date: Shortness of breath 09/19/2016: Stroke [facility])     Comment:  "left sided weakness" (09/20/2016) No date: Type II diabetes mellitus (HCC)   Reproductive/Obstetrics                            Lab Results  Component Value Date   WBC 5.5 03/29/2018   HGB 8.4 (L) 03/29/2018   HCT 29.6 (L) 03/29/2018   MCV 108.0 (H) 03/29/2018   PLT 142 (L) 03/29/2018   Lab Results  Component Value Date   CREATININE 1.73 (H) 03/29/2018   BUN 58 (H) 03/29/2018   NA 137 03/29/2018   K 4.0 03/29/2018   CL 100 03/29/2018   CO2 28 03/29/2018   Lab Results  Component Value Date   INR 1.3 (H) 03/29/2018   INR 1.43 03/22/2018   INR 1.16 12/07/2017   Echo: - Left ventricle: The cavity size was normal. There was mild focal   basal hypertrophy of the septum. Systolic function was mildly   reduced. The estimated ejection fraction was in the range of 45%   to 50%. Difficult to visualize. Consider Definity contrast if   clinically necessary. Diffuse hypokinesis. Doppler parameters are   consistent with abnormal left ventricular relaxation (grade 1   diastolic dysfunction). - Mitral valve: There was mild regurgitation directed posteriorly. - Pulmonary arteries: Systolic pressure was mildly increased. PA   peak pressure: 32 mm Hg (S).   Anesthesia Physical  Anesthesia Plan  ASA: IV  Anesthesia Plan: General   Post-op Pain Management:    Induction: Intravenous  PONV Risk Score and Plan: 4 or greater and Ondansetron, Propofol infusion and Treatment may vary due to age or medical condition  Airway Management Planned: Oral ETT  Additional Equipment: None  Intra-op Plan:   Post-operative Plan: Possible Post-op intubation/ventilation and Extubation in OR  Informed Consent: I have reviewed the patients History and Physical, chart, labs and discussed the procedure including the risks, benefits and alternatives for the proposed anesthesia with the patient or authorized representative who has indicated his/her understanding and acceptance.       Plan Discussed with: CRNA  Anesthesia Plan Comments: (I talked  with patient's son, Dr. Mayer Masker.  He is aware of her high risk status and the probability of post op intubation.)       Anesthesia Quick Evaluation

## 2018-03-29 NOTE — Care Management Important Message (Signed)
Important Message  Patient Details  Name: [name] MRN: [id number] Date of Birth: 10/24/1927   Medicare Important Message Given:  Yes    Beverly Sessions, RN 03/29/2018, 2:57 PM

## 2018-03-30 ENCOUNTER — Inpatient Hospital Stay: Payer: Medicare Other

## 2018-03-30 ENCOUNTER — Encounter: Payer: Self-pay | Admitting: Vascular Surgery

## 2018-03-30 DIAGNOSIS — N178 Other acute kidney failure: Secondary | ICD-10-CM

## 2018-03-30 DIAGNOSIS — Z992 Dependence on renal dialysis: Secondary | ICD-10-CM

## 2018-03-30 DIAGNOSIS — N186 End stage renal disease: Secondary | ICD-10-CM

## 2018-03-30 LAB — BLOOD GAS, ARTERIAL
Acid-Base Excess: 5.3 mmol/L — ABNORMAL HIGH (ref 0.0–2.0)
Bicarbonate: 31.1 mmol/L — ABNORMAL HIGH (ref 20.0–28.0)
FIO2: 0.4
Mechanical Rate: 12
O2 Saturation: 99.4 %
PEEP: 5 cmH2O
Patient temperature: 37
VT: 450 mL
pCO2 arterial: 49 mmHg — ABNORMAL HIGH (ref 32.0–48.0)
pH, Arterial: 7.41 (ref 7.350–7.450)
pO2, Arterial: 160 mmHg — ABNORMAL HIGH (ref 83.0–108.0)

## 2018-03-30 LAB — BASIC METABOLIC PANEL
Anion gap: 6 (ref 5–15)
BUN: 70 mg/dL — ABNORMAL HIGH (ref 8–23)
CO2: 31 mmol/L (ref 22–32)
CREATININE: 2.12 mg/dL — AB (ref 0.44–1.00)
Calcium: 8.9 mg/dL (ref 8.9–10.3)
Chloride: 99 mmol/L (ref 98–111)
GFR calc non Af Amer: 20 mL/min — ABNORMAL LOW (ref >60)
GFR, EST AFRICAN AMERICAN: 23 mL/min — AB (ref >60)
Glucose, Bld: 143 mg/dL — ABNORMAL HIGH (ref 70–99)
Potassium: 4.2 mmol/L (ref 3.5–5.1)
Sodium: 136 mmol/L (ref 135–145)

## 2018-03-30 LAB — PHOSPHORUS: Phosphorus: 3.1 mg/dL (ref 2.5–4.6)

## 2018-03-30 LAB — GLUCOSE, CAPILLARY
Glucose-Capillary: 165 mg/dL — ABNORMAL HIGH (ref 70–99)
Glucose-Capillary: 121 mg/dL — ABNORMAL HIGH (ref 70–99)
Glucose-Capillary: 97 mg/dL (ref 70–99)
Glucose-Capillary: 83 mg/dL (ref 70–99)

## 2018-03-30 LAB — CBC
HCT: 29.7 % — ABNORMAL LOW (ref 36.0–46.0)
Hemoglobin: 8.6 g/dL — ABNORMAL LOW (ref 12.0–15.0)
MCH: 30.7 pg (ref 26.0–34.0)
MCHC: 29 g/dL — AB (ref 30.0–36.0)
MCV: 106.1 fL — ABNORMAL HIGH (ref 80.0–100.0)
Platelets: 149 10*3/uL — ABNORMAL LOW (ref 150–400)
RBC: 2.8 MIL/uL — ABNORMAL LOW (ref 3.87–5.11)
RDW: 18.9 % — AB (ref 11.5–15.5)
WBC: 5.6 10*3/uL (ref 4.0–10.5)
nRBC: 0.4 % — ABNORMAL HIGH (ref 0.0–0.2)

## 2018-03-30 LAB — HEPATITIS B SURFACE ANTIGEN: Hepatitis B Surface Ag: NEGATIVE

## 2018-03-30 MED ORDER — MIDODRINE HCL 5 MG PO TABS
5.0000 mg | ORAL_TABLET | Freq: Once | ORAL | Status: AC
  Administered 2018-03-30: 5 mg via ORAL
  Filled 2018-03-30: qty 1

## 2018-03-30 NOTE — Progress Notes (Signed)
Pre HD assessment   03/30/18 1607  Vital Signs  Temp (!) 97.5 F (36.4 C)  Temp Source Axillary  Pulse Rate 93  Pulse Rate Source Monitor  Resp 15  BP 117/71  BP Location Right Arm  BP Method Automatic  Patient Position (if appropriate) Lying  Oxygen Therapy  SpO2 99 %  O2 Device Nasal Cannula  O2 Flow Rate (L/min) 2 L/min  Pain Assessment  Pain Scale PAINAD  PAINAD (Pain Assessment in Advanced Dementia)  Breathing 0  Negative Vocalization 0  Facial Expression 0  Body Language 1  Consolability 0  PAINAD Score 1  Dialysis Weight  Weight 76.5 kg  Type of Weight Pre-Dialysis  Time-Out for Hemodialysis  What Procedure? HD  Pt Identifiers(min of two) First/Last Name;MRN/Account#  Correct Site? Yes  Correct Side? Yes  Correct Procedure? Yes  Consents Verified? Yes  Rad Studies Available? N/A  Safety Precautions Reviewed? Yes  Engineer, civil (consulting) Number  (4A)  Station Number 4  UF/Alarm Test Passed  Conductivity: Meter 13.4  Conductivity: Machine  13.6  pH 7.2  Reverse Osmosis main  Normal Saline Lot Number [id number]  Dialyzer Lot Number [id number]  Disposable Set Lot Number [id number]  Machine Temperature 98.6 F (37 C)  Musician and Audible Yes  Blood Lines Intact and Secured Yes  Pre Treatment Patient Checks  Vascular access used during treatment Catheter  Hepatitis B Surface Antigen Results Negative  Date Hepatitis B Surface Antigen Drawn 03/28/18  Isolation Initiated Yes (contact, MRSA/ESBL)  Hepatitis B Surface Antibody  (<10)  Date Hepatitis B Surface Antibody Drawn 02/26/18  Hemodialysis Consent Verified Yes  Hemodialysis Standing Orders Initiated Yes  ECG (Telemetry) Monitor On Yes  Prime Ordered Normal Saline  Length of  DialysisTreatment -hour(s) 3.5 Hour(s)  Dialyzer Elisio 17H NR  Dialysate 3K, 2.5 Ca  Dialysis Anticoagulant None  Dialysate Flow Ordered 600  Blood Flow Rate Ordered 300 mL/min  Ultrafiltration Goal 1 Liters  Pre  Treatment Labs Phosphorus  Dialysis Blood Pressure Support Ordered Normal Saline  Education / Care Plan  Dialysis Education Provided Yes  Documented Education in Care Plan Yes  Hemodialysis Catheter Right Subclavian Double-lumen;Permanent  Placement Date: 03/05/18   Orientation: Right  Access Location: Subclavian  Hemodialysis Catheter Type: Double-lumen;Permanent  Site Condition No complications  Dressing Type Biopatch  Dressing Status Clean;Dry;Intact  Drainage Description None

## 2018-03-30 NOTE — Progress Notes (Signed)
Post HD assessment. Pt tolerated tx well without c/o or complication. Net UF 1002, goal met.    03/30/18 1959  Vital Signs  Temp (!) 97.1 F (36.2 C)  Temp Source Axillary  Pulse Rate (!) 59  Pulse Rate Source Monitor  Resp 12  BP 122/79  BP Location Right Arm  BP Method Automatic  Patient Position (if appropriate) Lying  Oxygen Therapy  SpO2 100 %  O2 Device Nasal Cannula  O2 Flow Rate (L/min) 2 L/min  Dialysis Weight  Weight 75.1 kg  Type of Weight Post-Dialysis  Post-Hemodialysis Assessment  Rinseback Volume (mL) 250 mL  KECN 61.3 V  Dialyzer Clearance Lightly streaked  Duration of HD Treatment -hour(s) 3.5 hour(s)  Hemodialysis Intake (mL) 500 mL  UF Total -Machine (mL) 1502 mL  Net UF (mL) 1002 mL  Tolerated HD Treatment Yes  Education / Care Plan  Dialysis Education Provided Yes  Documented Education in Care Plan Yes  Hemodialysis Catheter Right Subclavian Double-lumen;Permanent  Placement Date: 03/05/18   Orientation: Right  Access Location: Subclavian  Hemodialysis Catheter Type: Double-lumen;Permanent  Site Condition No complications  Blue Lumen Status Heparin locked  Red Lumen Status Heparin locked  Purple Lumen Status N/A  Catheter fill solution Heparin 1000 units/ml  Catheter fill volume (Arterial) 1.5 cc  Catheter fill volume (Venous) 1.5  Dressing Type Biopatch  Dressing Status Clean;Dry;Intact  Drainage Description None  Post treatment catheter status Capped and Clamped

## 2018-03-30 NOTE — Progress Notes (Signed)
Prime doc notified BP 90/60, orders given for dose of midodrine to be given, also pass on to dayshift to inquire abot restarting tube feeds in am.

## 2018-03-30 NOTE — Anesthesia Postprocedure Evaluation (Signed)
Anesthesia Post Note  Patient: [name]  Procedure(s) Performed: LAPAROSCOPIC INSERTION CONTINUOUS AMBULATORY PERITONEAL DIALYSIS  (CAPD) CATHETER (N/A )  Patient location during evaluation: ICU Anesthesia Type: General Level of consciousness: patient remains intubated per anesthesia plan, confused and patient cooperative Pain management: pain level controlled Vital Signs Assessment: post-procedure vital signs reviewed and stable Respiratory status: patient on ventilator - see flowsheet for VS Cardiovascular status: stable Anesthetic complications: no Comments: Pt rtemained intubated postop. No gtts. VSS     Last Vitals:  Vitals:   03/30/18 0500 03/30/18 0600  BP:  122/77  Pulse: 85 81  Resp: 13 12  Temp:    SpO2: 99% 100%    Last Pain:  Vitals:   03/30/18 0400  TempSrc: Oral  PainSc:                  Lanora Manis

## 2018-03-30 NOTE — Progress Notes (Signed)
[facility] NAME: [name]    MR#:  [id number]  DATE OF BIRTH:  Jan 19, 1928  SUBJECTIVE:   Patient had peritoneal dialysis catheter placed yesterday. She is alert and now extubated after procedure.   REVIEW OF SYSTEMS:  Review of Systems  Unable to perform ROS: Mental status change   DRUG ALLERGIES:   Allergies  Allergen Reactions  . Reglan [Metoclopramide]     Pt's son reports this causes patient tremors and neurologic issues   . Contrast Media [Iodinated Diagnostic Agents]     Sensitivity, kidney issues    VITALS:  Blood pressure 126/71, pulse 64, temperature 98.6 F (37 C), temperature source Oral, resp. rate 14, height 5\' 3"  (1.6 m), weight 74 kg, SpO2 100 %.  PHYSICAL EXAMINATION:  Physical Exam  GENERAL:  85 y.o.-year-old elderly patient lying in the bed with no acute distress. Non verbal this am EYES: Pupils equal, round, reactive to light . No scleral icterus.  HEENT: Head atraumatic, normocephalic.  NECK:  Supple, no jugular venous distention. No thyroid enlargement, no tenderness.  LUNGS: CTAB Right chest permacath present CARDIOVASCULAR: S1, S2 normal. No  rubs, or gallops.  ABDOMEN: Soft, nontender, nondistended. Bowel sounds present. No organomegaly or mass.  PEG tube in place EXTREMITIES: Anasarca.  No cyanosis, or clubbing.  NEUROLOGIC: Unable to exam. SKIN:  Has a stage II pressure ulcer in the sacral area. LABORATORY PANEL:   CBC Recent Labs  Lab 03/30/18 0715  WBC 5.6  HGB 8.6*  HCT 29.7*  PLT 149*   ------------------------------------------------------------------------------------------------------------------  Chemistries  Recent Labs  Lab 03/29/18 1108  03/30/18 0715  NA 137   < > 136  K 4.0   < > 4.2  CL 100  --  99  CO2 28  --  31  GLUCOSE 152*   < > 143*  BUN 58*  --  70*  CREATININE 1.73*  --  2.12*  CALCIUM 8.6*  --  8.9  MG 1.9  --   --    < > = values in this  interval not displayed.   ------------------------------------------------------------------------------------------------------------------  Cardiac Enzymes No results for input(s): TROPONINI in the last 168 hours. ------------------------------------------------------------------------------------------------------------------  RADIOLOGY:  X-ray Chest Pa Or Ap  Result Date: 03/29/2018 CLINICAL DATA:  Preoperative exam. EXAM: CHEST  1 VIEW COMPARISON:  03/20/2018 FINDINGS: Large bore catheter in stable position, tip at the expected location of the cavoatrial junction. Postsurgical changes of CABG. Enlarged cardiac silhouette. Calcific atherosclerotic disease of the aorta. Bilateral pleural effusions, moderate with interstitial pulmonary edema. Osseous structures are without acute abnormality. Soft tissues are grossly normal. IMPRESSION: Enlarged cardiac silhouette with moderate interstitial pulmonary edema and bilateral pleural effusions. Electronically Signed   By: Fidela Salisbury M.D.   On: 03/29/2018 13:28   Dg Chest Port 1 View  Result Date: 03/30/2018 CLINICAL DATA:  Acute respiratory failure EXAM: PORTABLE CHEST 1 VIEW COMPARISON:  03/29/2018 FINDINGS: Endotracheal tube in good position. Central venous catheter tip in the right atrium unchanged. Mild improvement in pulmonary edema. Persistent small bilateral effusions with bibasilar atelectasis. IMPRESSION: Endotracheal tube in good position. Mild improvement in pulmonary edema. Persistent small bilateral effusions and bibasilar atelectasis. Electronically Signed   By: Franchot Gallo M.D.   On: 03/30/2018 06:37   Dg Chest Port 1 View  Result Date: 03/29/2018 CLINICAL DATA:  85 y/o  F; ETT. EXAM: PORTABLE CHEST 1 VIEW COMPARISON:  03/29/2018 chest radiograph FINDINGS: Stable enlarged  cardiac silhouette given projection and technique. Right central venous catheter tip projects over the lower SVC. Endotracheal tube tip 5.1 cm above the  carina. CABG and median sternotomy postsurgical changes. Improved aeration at the lung bases and diminished pleural effusions. Diminished pulmonary vascular congestion. No new consolidation. No acute osseous abnormality is evident. Partially visualized chronic fracture deformity of right proximal humerus. IMPRESSION: Improved aeration at the lung bases and diminished pleural effusions. Diminished pulmonary vascular congestion. Endotracheal tube tip projects 5.1 cm above carina. Electronically Signed   By: Kristine Garbe M.D.   On: 03/29/2018 19:45    EKG:   Orders placed or performed during the hospital encounter of 02/26/18  . EKG 12-Lead  . EKG 12-Lead  . EKG 12-Lead  . EKG 12-Lead    ASSESSMENT AND PLAN:   [name] is an82 y.o.femalewith multiple comorbidities including diabetes type 2, hypertension, hyperlipidemia, coronary disease status post CABG, history of stroke, and CKD who was admitted on1/27/2020. [facility] [place] [facility] a[facility]and 50 pound weight loss in 12/2017.Status post treatment for ESBL Klebsiella aspiration pneumonia, enterococcus UTI. Admitted for dialysis.  1.  Acute renal failure on CKD stage III- admitted with uremia and encephalopathy Progressed to end-stage renal disease The tentative plan is for the patient to continue hemodialysis for the next several weeks and then transition the patient to peritoneal dialysis  2. Acute on chronic combined heart failure-echo with moderate mitral regurgitation and bilateral  pleural effusion.  Continue dialysis as per nephrology  3. Dysphagia secondary to achalasia- s/p Botox injections but causing pharyngeal muscle weakness.  Currently status post PEG tube and tube feeds.  4.  Diabetes mellitus-appreciate diabetes coordinator's input. On SSI.  5.  Left temporal and frontal CVA-on Plavix and statin  6.  Anemia-likely anemia of chronic disease . -transfusion if  hemoglobin is less than 7. Continue EPO with dialysis.  7.  Acute encephalopathy-initially was in uremic encephalopathy with decreased mentation.    - Family refusing haldol.    8  Hypotension: Continue Midodrin. Blood pressure is better.   Overall poor prognosis.  Continue treatment as requested by family.  High risk for deterioration.  Discussed with nursing   CODE STATUS: Full code  TOTAL TIME TAKING CARE OF THIS PATIENT: 20 minutes.   Javier Mamone M.D on 03/30/2018 at 12:16 PM  Between 7am to 6pm - Pager - [phone]  After 6pm go to [email or web address] - password [email or web address] [facility]  Office  [phone]  CC: Primary care physician; Deland Pretty, MD

## 2018-03-30 NOTE — Progress Notes (Signed)
HD tx end    03/30/18 1950  Vital Signs  Pulse Rate 80  Pulse Rate Source Monitor  Resp 12  BP 119/85  BP Location Right Arm  BP Method Automatic  Patient Position (if appropriate) Lying  Oxygen Therapy  SpO2 100 %  O2 Device Nasal Cannula  O2 Flow Rate (L/min) 2 L/min  During Hemodialysis Assessment  Dialysis Fluid Bolus Normal Saline  Bolus Amount (mL) 250 mL  Intra-Hemodialysis Comments Tx completed

## 2018-03-30 NOTE — Progress Notes (Signed)
HD tx start    03/30/18 1613  Vital Signs  Pulse Rate 100  Pulse Rate Source Monitor  Resp 12  BP 95/84  BP Location Right Arm  BP Method Automatic  Patient Position (if appropriate) Lying  Oxygen Therapy  SpO2 100 %  O2 Device Nasal Cannula  O2 Flow Rate (L/min) 2 L/min  During Hemodialysis Assessment  Blood Flow Rate (mL/min) 300 mL/min  Arterial Pressure (mmHg) -90 mmHg  Venous Pressure (mmHg) 100 mmHg  Transmembrane Pressure (mmHg) 50 mmHg  Ultrafiltration Rate (mL/min) 430 mL/min  Dialysate Flow Rate (mL/min) 600 ml/min  Conductivity: Machine  13.6  HD Safety Checks Performed Yes  Dialysis Fluid Bolus Normal Saline  Bolus Amount (mL) 250 mL  Intra-Hemodialysis Comments Tx initiated  Hemodialysis Catheter Right Subclavian Double-lumen;Permanent  Placement Date: 03/05/18   Orientation: Right  Access Location: Subclavian  Hemodialysis Catheter Type: Double-lumen;Permanent  Blue Lumen Status Infusing  Red Lumen Status Infusing

## 2018-03-30 NOTE — Progress Notes (Signed)
[facility]  ROUNDING NOTE   Subjective:  PD catheter was successfully placed yesterday. Patient was intubated overnight but now successfully extubated. Patient awake and alert this a.m.    Objective:  Vital signs in last 24 hours:  Temp:  [97.7 F (36.5 C)-98.6 F (37 C)] 98.6 F (37 C) (02/28 0800) Pulse Rate:  [62-105] 83 (02/28 0800) Resp:  [12-16] 16 (02/28 0800) BP: (101-138)/(43-77) 126/71 (02/28 0800) SpO2:  [97 %-100 %] 100 % (02/28 0800) Arterial Line BP: (123-131)/(53-64) 128/58 (02/28 0800) FiO2 (%):  [28 %-40 %] 28 % (02/28 0733) Weight:  [74 kg] 74 kg (02/28 0500)  Weight change:  Filed Weights   03/26/18 1016 03/26/18 1335 03/30/18 0500  Weight: 79.2 kg 78 kg 74 kg    Intake/Output: I/O last 3 completed shifts: In: 684 [I.V.:250; NG/GT:434] Out: 3 [Blood:3]   Intake/Output this shift:  No intake/output data recorded.  Physical Exam: General:  No acute distress, laying in the bed  Head:  Temporal muscle wasting  Eyes:  Anicteric  Neck:  Supple  Lungs:   Normal breathing effort, CTAB  Heart:  Irregular rhythm  Abdomen:   Soft, nontender, +G-tube  Extremities:  Generalized anasarca  Neurologic:  Awake, will follow simple commands  Access:  RIJ permcath    Basic Metabolic Panel: Recent Labs  Lab 03/23/18 1052 03/29/18 1108 03/29/18 1449 03/30/18 0715  NA 136 137 138 136  K 4.4 4.0 3.9 4.2  CL 98 100  --  99  CO2 31 28  --  31  GLUCOSE 293* 152* 152* 143*  BUN 66* 58*  --  70*  CREATININE 1.92* 1.73*  --  2.12*  CALCIUM 8.4* 8.6*  --  8.9  MG  --  1.9  --   --   PHOS 2.1*  --   --   --     Liver Function Tests: Recent Labs  Lab 03/23/18 1052  ALBUMIN 2.3*   No results for input(s): LIPASE, AMYLASE in the last 168 hours. No results for input(s): AMMONIA in the last 168 hours.  CBC: Recent Labs  Lab 03/25/18 0911 03/29/18 1108 03/29/18 1449 03/30/18 0715  WBC 9.1 5.5  --  5.6  HGB 8.0* 8.4* 10.5* 8.6*  HCT  26.4* 29.6* 31.0* 29.7*  MCV 103.9* 108.0*  --  106.1*  PLT 118* 142*  --  149*    Cardiac Enzymes: No results for input(s): CKTOTAL, CKMB, CKMBINDEX, TROPONINI in the last 168 hours.  BNP: Invalid input(s): POCBNP  CBG: Recent Labs  Lab 03/29/18 1949 03/29/18 2325 03/29/18 2328 03/30/18 0318 03/30/18 0741  GLUCAP 147* 10* 157* 165* 121*    Microbiology: Results for orders placed or performed during the hospital encounter of 02/26/18  MRSA PCR Screening     Status: Abnormal   Collection Time: 02/26/18  5:39 PM  Result Value Ref Range Status   MRSA by PCR POSITIVE (A) NEGATIVE Final    Comment:        The GeneXpert MRSA Assay (FDA approved for NASAL specimens only), is one component of a comprehensive MRSA colonization surveillance program. It is not intended to diagnose MRSA infection nor to guide or monitor treatment for MRSA infections. CRITICAL RESULT CALLED TO, READ BACK BY AND VERIFIED WITH:  CALLED TO DANA DUGGINS RN @1920  Performed at [facility], [address] [place], [place] [address]   CULTURE, BLOOD (ROUTINE X 2) w Reflex to ID Panel     Status: None  Collection Time: 03/06/18  9:01 PM  Result Value Ref Range Status   Specimen Description BLOOD LEFT ANTECUBITAL  Final   Special Requests   Final    BOTTLES DRAWN AEROBIC ONLY Blood Culture adequate volume   Culture   Final    NO GROWTH 5 DAYS Performed at [facility], [address] [place], [place] [address]    Report Status 03/11/2018 FINAL  Final  CULTURE, BLOOD (ROUTINE X 2) w Reflex to ID Panel     Status: None   Collection Time: 03/06/18  9:37 PM  Result Value Ref Range Status   Specimen Description BLOOD LEFT ANTECUBITAL  Final   Special Requests   Final    BOTTLES DRAWN AEROBIC AND ANAEROBIC Blood Culture adequate volume   Culture   Final    NO GROWTH 5 DAYS Performed at [facility], [address] [place], [place] [address]    Report Status  03/11/2018 FINAL  Final  MRSA PCR Screening     Status: Abnormal   Collection Time: 03/29/18  8:56 PM  Result Value Ref Range Status   MRSA by PCR POSITIVE (A) NEGATIVE Final    Comment:        The GeneXpert MRSA Assay (FDA approved for NASAL specimens only), is one component of a comprehensive MRSA colonization surveillance program. It is not intended to diagnose MRSA infection nor to guide or monitor treatment for MRSA infections. RESULT CALLED TO, READ BACK BY AND VERIFIED WITH: HIRAL PATEL 03/29/18 @ 2215   [place] Performed at [facility], [address] [place], [place] [address]     Coagulation Studies: Recent Labs    03/29/18 1108  LABPROT 16.0*  INR 1.3*    Urinalysis: No results for input(s): COLORURINE, LABSPEC, PHURINE, GLUCOSEU, HGBUR, BILIRUBINUR, KETONESUR, PROTEINUR, UROBILINOGEN, NITRITE, LEUKOCYTESUR in the last 72 hours.  Invalid input(s): APPERANCEUR    Imaging: X-ray Chest Pa Or Ap  Result Date: 03/29/2018 CLINICAL DATA:  Preoperative exam. EXAM: CHEST  1 VIEW COMPARISON:  03/20/2018 FINDINGS: Large bore catheter in stable position, tip at the expected location of the cavoatrial junction. Postsurgical changes of CABG. Enlarged cardiac silhouette. Calcific atherosclerotic disease of the aorta. Bilateral pleural effusions, moderate with interstitial pulmonary edema. Osseous structures are without acute abnormality. Soft tissues are grossly normal. IMPRESSION: Enlarged cardiac silhouette with moderate interstitial pulmonary edema and bilateral pleural effusions. Electronically Signed   By: Fidela Salisbury M.D.   On: 03/29/2018 13:28   Dg Chest Port 1 View  Result Date: 03/30/2018 CLINICAL DATA:  Acute respiratory failure EXAM: PORTABLE CHEST 1 VIEW COMPARISON:  03/29/2018 FINDINGS: Endotracheal tube in good position. Central venous catheter tip in the right atrium unchanged. Mild improvement in pulmonary edema. Persistent small bilateral  effusions with bibasilar atelectasis. IMPRESSION: Endotracheal tube in good position. Mild improvement in pulmonary edema. Persistent small bilateral effusions and bibasilar atelectasis. Electronically Signed   By: Franchot Gallo M.D.   On: 03/30/2018 06:37   Dg Chest Port 1 View  Result Date: 03/29/2018 CLINICAL DATA:  85 y/o  F; ETT. EXAM: PORTABLE CHEST 1 VIEW COMPARISON:  03/29/2018 chest radiograph FINDINGS: Stable enlarged cardiac silhouette given projection and technique. Right central venous catheter tip projects over the lower SVC. Endotracheal tube tip 5.1 cm above the carina. CABG and median sternotomy postsurgical changes. Improved aeration at the lung bases and diminished pleural effusions. Diminished pulmonary vascular congestion. No new consolidation. No acute osseous abnormality is evident. Partially visualized chronic fracture deformity  of right proximal humerus. IMPRESSION: Improved aeration at the lung bases and diminished pleural effusions. Diminished pulmonary vascular congestion. Endotracheal tube tip projects 5.1 cm above carina. Electronically Signed   By: Kristine Garbe M.D.   On: 03/29/2018 19:45     Medications:   . sodium chloride 0 mL/hr at 03/26/18 0017  . sodium chloride Stopped (03/29/18 1700)  . albumin human 25 g (03/21/18 1256)  . feeding supplement (OSMOLITE 1.5 CAL) 1,000 mL (03/28/18 0547)  . phenylephrine (NEO-SYNEPHRINE) Adult infusion Stopped (03/29/18 2050)   . atorvastatin  40 mg Per Tube q1800  . B-complex with vitamin C  1 tablet Per Tube Daily  . chlorhexidine  15 mL Mouth Rinse BID  . clopidogrel  75 mg Per Tube QHS  . collagenase   Topical Daily  . epoetin (EPOGEN/PROCRIT) injection  10,000 Units Intravenous Q M,W,F-HD  . famotidine  10 mg Per Tube Daily  . feeding supplement (PRO-STAT SUGAR FREE 64)  30 mL Per Tube BID  . free water  20 mL Per Tube Q4H  . insulin aspart  0-9 Units Subcutaneous Q4H  . mouth rinse  15 mL Mouth Rinse  q12n4p  . midodrine  5 mg Oral TID WC  . multivitamin  15 mL Per Tube QHS  . mupirocin ointment  1 application Nasal BID  . nutrition supplement (JUVEN)  1 packet Per Tube BID BM   sodium chloride, acetaminophen (TYLENOL) oral liquid 160 mg/5 mL **OR** acetaminophen **OR** acetaminophen, albumin human, albuterol, fentaNYL (SUBLIMAZE) injection, fentaNYL (SUBLIMAZE) injection, ipratropium, lip balm, Melatonin, metoprolol tartrate, nitroGLYCERIN, ondansetron, polyethylene glycol, promethazine  Assessment/ Plan:  [name] is a 85 y.o. black female with diabetes mellitus type II, hypertension, hyperlipidemia, coronary disease status post CABG, history of stroke, CKD, was admitted on1/27/2020. [facility] [place] [facility] [place] 50 pound weight loss, dysarthria since November 2019. Now transitioned to [facility] on 02/26/18 to initiate hemodialysis. First hemodialysis treatment on 1/28 through [facility].   1. End Stage Renal Disease with generalized edema   No signs of renal recovery.  - Family is desiring peritoneal dialysis on discharge.      -PD catheter successfully placed 03/29/2018.  Patient awake and alert this a.m.  We will plan for hemodialysis today.  The tentative plan is for the patient to continue hemodialysis for the next several weeks and then transition the patient to peritoneal dialysis.  This was discussed with the patient's son and he is agreeable to this plan.  2. Hypotension: - maintain the patient on midodrine.  3. Anemia of chronic kidney disease: Hemoglobin up to 8.6.  Maintain the patient on Epogen.  4. Malnutrition with achalasia: G tube in place   Albumin remains low at 2.3 -Continue tube feeds for now.  5.  Aspiration pneumonia Treated with IV Unasyn.  Discontinued February 22 Management as per internal medicine team  6.  Cardiac history.  Atrial fibrillation.  CABG 05/2009, coronary angioplasty and stent 2004.  Severe chronic  systolic CHF.  LVEF 25%.  Right ventricular systolic pressure 53 mm.  From echo February 2020     LOS: 32 [name] 2/28/20209:29 AM

## 2018-03-30 NOTE — Progress Notes (Signed)
Post HD assessment    03/30/18 1958  Neurological  Level of Consciousness Alert  Orientation Level Oriented to person;Disoriented to place;Disoriented to time;Disoriented to situation  Respiratory  Respiratory Pattern Regular;Unlabored  Chest Assessment Chest expansion symmetrical  Cardiac  Pulse Irregular  ECG Monitor Yes  Cardiac Rhythm Atrial fibrillation;SB  Vascular  R Radial Pulse +2  L Radial Pulse +2  Edema Generalized;Right upper extremity;Left upper extremity;Right lower extremity;Left lower extremity  Integumentary  Integumentary (WDL) X  Skin Color Appropriate for ethnicity  Musculoskeletal  Musculoskeletal (WDL) X  Generalized Weakness Yes  Assistive Device None  GU Assessment  Genitourinary (WDL) X  Genitourinary Symptoms  (HD)  Psychosocial  Psychosocial (WDL) X  Patient Behaviors Not interactive

## 2018-03-30 NOTE — Progress Notes (Addendum)
[facility] Daily Progress Note  03/29/18: Laparoscopic peritoneal dialysis catheter placement.  03/05/18:  Ultrasound guidance for vascular access to the right internal jugular vein Fluoroscopic guidance for placement of catheter Placement of a 19 cm tip to cuff tunneled hemodialysis catheter via the right internal jugular vein  02/26/18: Ultrasound guidance for vascular access right femoral vein Placement of triple lumen dialysis catheter right femoral vein  Subjective: Patient extubated. Resting comfortably. No meaningful conversation.   Objective: Vitals:   03/30/18 0900 03/30/18 1000 03/30/18 1100 03/30/18 1200  BP:      Pulse: 91 64    Resp: 18 12 14 13   Temp:      TempSrc:      SpO2: 99% 100%    Weight:      Height:        Intake/Output Summary (Last 24 hours) at 03/30/2018 1240 Last data filed at 03/30/2018 0900 Gross per 24 hour  Intake 310 ml  Output 3 ml  Net 307 ml   Physical Exam: NAD, Extubated CV: RRR Pulmonary: CTA Bilaterally Abdomen: Soft, Non-tender, Non-distended, (+) Bowel Sounds  Feeding tube in place  Incisions: Covered with Dermabond. Clean and dry.  PD Catheter: Intact. Minimal seroanginous drainage noted   Vascular:  Hard to palpate pedal pulses. Edematous   Laboratory: CBC    Component Value Date/Time   WBC 5.6 03/30/2018 0715   HGB 8.6 (L) 03/30/2018 0715   HCT 29.7 (L) 03/30/2018 0715   PLT 149 (L) 03/30/2018 0715   BMET    Component Value Date/Time   NA 136 03/30/2018 0715   K 4.2 03/30/2018 0715   CL 99 03/30/2018 0715   CO2 31 03/30/2018 0715   GLUCOSE 143 (H) 03/30/2018 0715   BUN 70 (H) 03/30/2018 0715   CREATININE 2.12 (H) 03/30/2018 0715   CALCIUM 8.9 03/30/2018 0715   CALCIUM 10.2 11/23/2017 0910   GFRNONAA 20 (L) 03/30/2018 0715   GFRAA 23 (L) 03/30/2018 0715   Assessment/Planning: The patient is a 85 year old female with ESRD s/p Laparoscopic Placement of a Peritoneal Dialysis Catheter -  POD #1 1) Patient extubated 2) Permcath is functioning appropriately. Continue to dialyze through Permcath until PD catheter track matures. 3) Daily dressing changes to the PD catheter site.  4) Patient to follow up in one week for a post-op incision / catheter track check  Vascular Surgery to sign off at this time. Please re-consult if needed  Discussed with Dr. Ellis [facility] PA-C 03/30/2018 12:40 PM

## 2018-03-30 NOTE — Progress Notes (Signed)
Pre HD assessment    03/30/18 1608  Neurological  Level of Consciousness Alert  Orientation Level Oriented to person;Disoriented to place;Disoriented to time;Disoriented to situation  Respiratory  Respiratory Pattern Regular;Unlabored  Chest Assessment Chest expansion symmetrical  Cardiac  Pulse Irregular  ECG Monitor Yes  Cardiac Rhythm Atrial fibrillation;ST  Vascular  R Radial Pulse +2  L Radial Pulse +2  Edema Generalized;Right upper extremity;Left upper extremity;Right lower extremity;Left lower extremity  Integumentary  Integumentary (WDL) X  Skin Color Appropriate for ethnicity  Musculoskeletal  Musculoskeletal (WDL) X  Generalized Weakness Yes  Assistive Device None  GU Assessment  Genitourinary (WDL) X  Genitourinary Symptoms  (HD)  Psychosocial  Psychosocial (WDL) X  Patient Behaviors Not interactive

## 2018-03-30 NOTE — Progress Notes (Signed)
CRITICAL CARE NOTE  CC  Follow up resp failure  SUBJECTIVE Remains intubated On vent Critically ill Plan for SAT/SBT     SIGNIFICANT EVENTS history of acute metabolic encephalopathy combined systolic diastolic congestive heart failure moderate MR acute CVA, ESBL Klebsiella aspiration pneumonia and enterococcus UTI acute non-STEMI was admitted at [facility]  Patient had a very complicated course at [facility] over the last couple months.  transferred to [facility] for trial of hemodialysis  REVIEW OF SYSTEMS  PATIENT IS UNABLE TO PROVIDE COMPLETE REVIEW OF SYSTEM S DUE TO SEVERE CRITICAL ILLNESS AND ENCEPHALOPATHY  PHYSICAL EXAMINATION:  GENERAL:critically ill appearing, +resp distress HEAD: Normocephalic, atraumatic.  EYES: Pupils equal, round, reactive to light.  No scleral icterus.  MOUTH: Moist mucosal membrane. NECK: Supple. No thyromegaly. No nodules. No JVD.  PULMONARY: +rhonchi, +wheezing CARDIOVASCULAR: S1 and S2. Regular rate and rhythm. No murmurs, rubs, or gallops.  GASTROINTESTINAL: Soft, nontender, -distended. No masses. Positive bowel sounds. No hepatosplenomegaly.  MUSCULOSKELETAL: No swelling, clubbing, or edema.  NEUROLOGIC: obtunded SKIN:intact,warm,dry     CULTURE RESULTS   Recent Results (from the past 240 hour(s))  MRSA PCR Screening     Status: Abnormal   Collection Time: 03/29/18  8:56 PM  Result Value Ref Range Status   MRSA by PCR POSITIVE (A) NEGATIVE Final    Comment:        The GeneXpert MRSA Assay (FDA approved for NASAL specimens only), is one component of a comprehensive MRSA colonization surveillance program. It is not intended to diagnose MRSA infection nor to guide or monitor treatment for MRSA infections. RESULT CALLED TO, READ BACK BY AND VERIFIED WITH: HIRAL PATEL 03/29/18 @ 2215   [facility] Performed at [facility], [address] [place], [place] [address]           IMAGING    X-ray Chest Pa Or  Ap  Result Date: 03/29/2018 CLINICAL DATA:  Preoperative exam. EXAM: CHEST  1 VIEW COMPARISON:  03/20/2018 FINDINGS: Large bore catheter in stable position, tip at the expected location of the cavoatrial junction. Postsurgical changes of CABG. Enlarged cardiac silhouette. Calcific atherosclerotic disease of the aorta. Bilateral pleural effusions, moderate with interstitial pulmonary edema. Osseous structures are without acute abnormality. Soft tissues are grossly normal. IMPRESSION: Enlarged cardiac silhouette with moderate interstitial pulmonary edema and bilateral pleural effusions. Electronically Signed   By: Fidela Salisbury M.D.   On: 03/29/2018 13:28   Dg Chest Port 1 View  Result Date: 03/30/2018 CLINICAL DATA:  Acute respiratory failure EXAM: PORTABLE CHEST 1 VIEW COMPARISON:  03/29/2018 FINDINGS: Endotracheal tube in good position. Central venous catheter tip in the right atrium unchanged. Mild improvement in pulmonary edema. Persistent small bilateral effusions with bibasilar atelectasis. IMPRESSION: Endotracheal tube in good position. Mild improvement in pulmonary edema. Persistent small bilateral effusions and bibasilar atelectasis. Electronically Signed   By: Franchot Gallo M.D.   On: 03/30/2018 06:37   Dg Chest Port 1 View  Result Date: 03/29/2018 CLINICAL DATA:  85 y/o  F; ETT. EXAM: PORTABLE CHEST 1 VIEW COMPARISON:  03/29/2018 chest radiograph FINDINGS: Stable enlarged cardiac silhouette given projection and technique. Right central venous catheter tip projects over the lower SVC. Endotracheal tube tip 5.1 cm above the carina. CABG and median sternotomy postsurgical changes. Improved aeration at the lung bases and diminished pleural effusions. Diminished pulmonary vascular congestion. No new consolidation. No acute osseous abnormality is evident. Partially visualized chronic fracture deformity of right proximal humerus. IMPRESSION: Improved aeration at the lung  bases and diminished  pleural effusions. Diminished pulmonary vascular congestion. Endotracheal tube tip projects 5.1 cm above carina. Electronically Signed   By: Kristine Garbe M.D.   On: 03/29/2018 19:45        Indwelling Urinary Catheter continued, requirement due to   Reason to continue Indwelling Urinary Catheter for strict Intake/Output monitoring for hemodynamic instability         Ventilator continued, requirement due to, resp failure    Ventilator Sedation RASS 0 to -2     ASSESSMENT AND PLAN SYNOPSIS   Severe Hypoxic and Hypercapnic Respiratory Failure -continue Mechanical Ventilator support -continue Bronchodilator Therapy -Wean Fio2 and PEEP as tolerated -VAP/VENT bundle implementation -will perform SAT/SBT when respiratory parameters are met   Renal Failure- -follow chem 7 -follow UO -continue Foley Catheter-assess need Follow up Nephrology recs   NEUROLOGY - intubated and sedated - minimal sedation to achieve a RASS goal: -1   CARDIAC ICU monitoring   ELECTROLYTES -follow labs as needed -replace as needed -pharmacy consultation and following    DVT/GI PRX ordered TRANSFUSIONS AS NEEDED MONITOR FSBS ASSESS the need for LABS as needed   Critical Care Time devoted to patient care services described in this note is 34 minutes.   Overall, patient is critically ill, prognosis is guarded.  Patient with Multiorgan failure and at high risk for cardiac arrest and death.    Corrin Parker, M.D.  [facility]  Medical Director [place] Director [facility] Cardio-Pulmonary Department

## 2018-03-31 LAB — GLUCOSE, CAPILLARY
GLUCOSE-CAPILLARY: 66 mg/dL — AB (ref 70–99)
Glucose-Capillary: 137 mg/dL — ABNORMAL HIGH (ref 70–99)
Glucose-Capillary: 214 mg/dL — ABNORMAL HIGH (ref 70–99)
Glucose-Capillary: 22 mg/dL — CL (ref 70–99)
Glucose-Capillary: 280 mg/dL — ABNORMAL HIGH (ref 70–99)
Glucose-Capillary: 44 mg/dL — CL (ref 70–99)
Glucose-Capillary: 48 mg/dL — ABNORMAL LOW (ref 70–99)
Glucose-Capillary: 82 mg/dL (ref 70–99)
Glucose-Capillary: 87 mg/dL (ref 70–99)
Glucose-Capillary: 98 mg/dL (ref 70–99)

## 2018-03-31 MED ORDER — DEXTROSE 50 % IV SOLN
1.0000 | Freq: Once | INTRAVENOUS | Status: DC
  Filled 2018-03-31: qty 50

## 2018-03-31 MED ORDER — DEXTROSE 50 % IV SOLN
INTRAVENOUS | Status: AC
  Administered 2018-03-31: 50 mL
  Filled 2018-03-31: qty 50

## 2018-03-31 MED ORDER — MIDODRINE HCL 5 MG PO TABS
5.0000 mg | ORAL_TABLET | Freq: Three times a day (TID) | ORAL | Status: DC
  Administered 2018-03-31 – 2018-04-03 (×8): 5 mg
  Filled 2018-03-31 (×10): qty 1

## 2018-03-31 MED ORDER — DEXTROSE 50 % IV SOLN
50.0000 mL | Freq: Once | INTRAVENOUS | Status: AC
  Administered 2018-03-31: 50 mL via INTRAVENOUS

## 2018-03-31 MED ORDER — DEXTROSE 50 % IV SOLN
25.0000 mL | Freq: Once | INTRAVENOUS | Status: AC
  Administered 2018-03-31: 25 mL via INTRAVENOUS

## 2018-03-31 MED ORDER — DEXTROSE 50 % IV SOLN: 25.0000 mL | Freq: Once | INTRAVENOUS | Status: DC

## 2018-03-31 MED ORDER — DEXTROSE 50 % IV SOLN
INTRAVENOUS | Status: AC
  Filled 2018-03-31: qty 50

## 2018-03-31 NOTE — Progress Notes (Signed)
[facility]  ROUNDING NOTE   Subjective:   Hemodialysis treatment yesterday.   More responsive today. Did not let me examine her.   Objective:  Vital signs in last 24 hours:  Temp:  [97.1 F (36.2 C)-98.2 F (36.8 C)] 97.4 F (36.3 C) (02/29 1121) Pulse Rate:  [59-119] 79 (02/29 1505) Resp:  [12-18] 15 (02/29 0440) BP: (60-128)/(29-104) 102/46 (02/29 1505) SpO2:  [68 %-100 %] 100 % (02/29 1505) Weight:  [75.1 kg-76.5 kg] 76.1 kg (02/29 0500)  Weight change: 2.5 kg Filed Weights   03/30/18 1607 03/30/18 1959 03/31/18 0500  Weight: 76.5 kg 75.1 kg 76.1 kg    Intake/Output: I/O last 3 completed shifts: In: 60 [NG/GT:60] Out: 1002 [Other:1002]   Intake/Output this shift:  No intake/output data recorded.  Physical Exam: General:  No acute distress, laying in the bed  Head:  Temporal muscle wasting  Eyes:  Anicteric  Neck:  Supple  Lungs:   clear  Heart:  Irregular rhythm  Abdomen:   Soft, nontender, +G-tube  Extremities:  +++ anasarca  Neurologic:  Awake, will follow simple commands  Access:  RIJ permcath, PD catheter    Basic Metabolic Panel: Recent Labs  Lab 03/29/18 1108 03/29/18 1449 03/30/18 0715 03/30/18 1515  NA 137 138 136  --   K 4.0 3.9 4.2  --   CL 100  --  99  --   CO2 28  --  31  --   GLUCOSE 152* 152* 143*  --   BUN 58*  --  70*  --   CREATININE 1.73*  --  2.12*  --   CALCIUM 8.6*  --  8.9  --   MG 1.9  --   --   --   PHOS  --   --   --  3.1    Liver Function Tests: No results for input(s): AST, ALT, ALKPHOS, BILITOT, PROT, ALBUMIN in the last 168 hours. No results for input(s): LIPASE, AMYLASE in the last 168 hours. No results for input(s): AMMONIA in the last 168 hours.  CBC: Recent Labs  Lab 03/25/18 0911 03/29/18 1108 03/29/18 1449 03/30/18 0715  WBC 9.1 5.5  --  5.6  HGB 8.0* 8.4* 10.5* 8.6*  HCT 26.4* 29.6* 31.0* 29.7*  MCV 103.9* 108.0*  --  106.1*  PLT 118* 142*  --  149*    Cardiac Enzymes: No  results for input(s): CKTOTAL, CKMB, CKMBINDEX, TROPONINI in the last 168 hours.  BNP: Invalid input(s): POCBNP  CBG: Recent Labs  Lab 03/31/18 0745 03/31/18 0751 03/31/18 0832 03/31/18 0923 03/31/18 1104  GLUCAP 22* 90* 24* 14 137*    Microbiology: Results for orders placed or performed during the hospital encounter of 02/26/18  MRSA PCR Screening     Status: Abnormal   Collection Time: 02/26/18  5:39 PM  Result Value Ref Range Status   MRSA by PCR POSITIVE (A) NEGATIVE Final    Comment:        The GeneXpert MRSA Assay (FDA approved for NASAL specimens only), is one component of a comprehensive MRSA colonization surveillance program. It is not intended to diagnose MRSA infection nor to guide or monitor treatment for MRSA infections. CRITICAL RESULT CALLED TO, READ BACK BY AND VERIFIED WITH:  CALLED TO DANA DUGGINS RN @1920  Performed at [facility], [address] [place], [place] [address]   CULTURE, BLOOD (ROUTINE X 2) w Reflex to ID Panel     Status: None   Collection  Time: 03/06/18  9:01 PM  Result Value Ref Range Status   Specimen Description BLOOD LEFT ANTECUBITAL  Final   Special Requests   Final    BOTTLES DRAWN AEROBIC ONLY Blood Culture adequate volume   Culture   Final    NO GROWTH 5 DAYS Performed at [facility], [address] [place], [place] [address]    Report Status 03/11/2018 FINAL  Final  CULTURE, BLOOD (ROUTINE X 2) w Reflex to ID Panel     Status: None   Collection Time: 03/06/18  9:37 PM  Result Value Ref Range Status   Specimen Description BLOOD LEFT ANTECUBITAL  Final   Special Requests   Final    BOTTLES DRAWN AEROBIC AND ANAEROBIC Blood Culture adequate volume   Culture   Final    NO GROWTH 5 DAYS Performed at [facility], [address] [place],  [place] [address]    Report Status 03/11/2018 FINAL  Final  MRSA PCR Screening     Status: Abnormal   Collection Time: 03/29/18  8:56 PM  Result Value  Ref Range Status   MRSA by PCR POSITIVE (A) NEGATIVE Final    Comment:        The GeneXpert MRSA Assay (FDA approved for NASAL specimens only), is one component of a comprehensive MRSA colonization surveillance program. It is not intended to diagnose MRSA infection nor to guide or monitor treatment for MRSA infections. RESULT CALLED TO, READ BACK BY AND VERIFIED WITH: HIRAL PATEL 03/29/18 @ 2215   [place] Performed at [facility], [address] [place], [place] [address]     Coagulation Studies: Recent Labs    03/29/18 1108  LABPROT 16.0*  INR 1.3*    Urinalysis: No results for input(s): COLORURINE, LABSPEC, PHURINE, GLUCOSEU, HGBUR, BILIRUBINUR, KETONESUR, PROTEINUR, UROBILINOGEN, NITRITE, LEUKOCYTESUR in the last 72 hours.  Invalid input(s): APPERANCEUR    Imaging: Dg Chest Port 1 View  Result Date: 03/30/2018 CLINICAL DATA:  Acute respiratory failure EXAM: PORTABLE CHEST 1 VIEW COMPARISON:  03/29/2018 FINDINGS: Endotracheal tube in good position. Central venous catheter tip in the right atrium unchanged. Mild improvement in pulmonary edema. Persistent small bilateral effusions with bibasilar atelectasis. IMPRESSION: Endotracheal tube in good position. Mild improvement in pulmonary edema. Persistent small bilateral effusions and bibasilar atelectasis. Electronically Signed   By: Franchot Gallo M.D.   On: 03/30/2018 06:37   Dg Chest Port 1 View  Result Date: 03/29/2018 CLINICAL DATA:  85 y/o  F; ETT. EXAM: PORTABLE CHEST 1 VIEW COMPARISON:  03/29/2018 chest radiograph FINDINGS: Stable enlarged cardiac silhouette given projection and technique. Right central venous catheter tip projects over the lower SVC. Endotracheal tube tip 5.1 cm above the carina. CABG and median sternotomy postsurgical changes. Improved aeration at the lung bases and diminished pleural effusions. Diminished pulmonary vascular congestion. No new consolidation. No acute osseous abnormality is  evident. Partially visualized chronic fracture deformity of right proximal humerus. IMPRESSION: Improved aeration at the lung bases and diminished pleural effusions. Diminished pulmonary vascular congestion. Endotracheal tube tip projects 5.1 cm above carina. Electronically Signed   By: Kristine Garbe M.D.   On: 03/29/2018 19:45     Medications:   . sodium chloride 0 mL/hr at 03/26/18 0017  . sodium chloride Stopped (03/29/18 1700)  . albumin human 25 g (03/21/18 1256)  . feeding supplement (OSMOLITE 1.5 CAL) 1,000 mL (03/31/18 0944)  . phenylephrine (NEO-SYNEPHRINE) Adult infusion Stopped (03/29/18 2050)   . atorvastatin  40 mg Per Tube  q28  . B-complex with vitamin C  1 tablet Per Tube Daily  . chlorhexidine  15 mL Mouth Rinse BID  . clopidogrel  75 mg Per Tube QHS  . collagenase   Topical Daily  . dextrose  1 ampule Intravenous Once  . epoetin (EPOGEN/PROCRIT) injection  10,000 Units Intravenous Q M,W,F-HD  . famotidine  10 mg Per Tube Daily  . feeding supplement (PRO-STAT SUGAR FREE 64)  30 mL Per Tube BID  . free water  20 mL Per Tube Q4H  . insulin aspart  0-9 Units Subcutaneous Q4H  . mouth rinse  15 mL Mouth Rinse q12n4p  . midodrine  5 mg Per Tube TID WC  . multivitamin  15 mL Per Tube QHS  . mupirocin ointment  1 application Nasal BID  . nutrition supplement (JUVEN)  1 packet Per Tube BID BM   sodium chloride, acetaminophen (TYLENOL) oral liquid 160 mg/5 mL **OR** acetaminophen **OR** acetaminophen, albumin human, albuterol, fentaNYL (SUBLIMAZE) injection, fentaNYL (SUBLIMAZE) injection, ipratropium, lip balm, Melatonin, metoprolol tartrate, nitroGLYCERIN, ondansetron, polyethylene glycol, promethazine  Assessment/ Plan:  Ms. Chrishonda Hesch is a 85 y.o. black female with diabetes mellitus type II, hypertension, hyperlipidemia, coronary disease status post CABG, history of stroke, CKD, was admitted on1/27/2020. Originally admitted to [place] [facility]with  Achalasiaand 50 pound weight loss, dysarthria since November 2019. Now transitioned to [facility] on 02/26/18 to initiate hemodialysis. First hemodialysis treatment on 1/28 through [facility].   1. End Stage Renal Disease with generalized edema   No signs of renal recovery.  - Family is desiring peritoneal dialysis on discharge.      -PD catheter successfully placed 03/29/2018. - Hemodialysis yesterday. The tentative plan is for the patient to continue hemodialysis for the next several weeks and then transition the patient to peritoneal dialysis.  This was discussed with the patient's son and he is agreeable to this plan. Outpatient dialysis center [facility]   2. Hypotension: -  midodrine.  3. Anemia of chronic kidney disease: Hemoglobin 8.6 - EPO with HD treatment  4. Malnutrition with achalasia: albumin 2.3 -Continue tube feeds    LOS: 33 [name] 2/29/20203:43 PM

## 2018-03-31 NOTE — Progress Notes (Signed)
At 36 RN was notified that blood pressure was 60/34 at 1121. Blood pressure was immediately checked and is 89/52 heart rate 79. Dr. Anselm Jungling has been paged and notified. No orders for any interventions were given except to recheck blood pressure in 1 to 2 hours.

## 2018-03-31 NOTE — Progress Notes (Signed)
Another amp of D50 given for blood glucose of 48. Patient remains alert and talking. Dr. Anselm Jungling was paged, notified of low blood sugars, patient transferred from ICU last night but tube feeds had not been resumed. Asked permission to restart tube feeds. Dr. Anselm Jungling gave order over the telephone to restart tube feedings. Dietary has been contacted to send up bottle.

## 2018-03-31 NOTE — Progress Notes (Signed)
Prime doc notified of patients decreased blood sugar, 1/2 amp d5o given as protocol ordered, acknowledged

## 2018-03-31 NOTE — Care Management (Signed)
RNCM spoke with son via telephone to update on discharge disposition.  RNCM informed son that [facility] will be able to accept patient for home health services at discharge. Son in agreement.  CMS Medicare.gov Compare Post Acute Care list reviewed with son via phone, copy left and room, and copy sent to HIM.   RNCM confirmed with son that reclining WC has been delivered home.  Per son plan is for patient to be dialyzed in house on Monday.   After HD discharge home via EMS transport.  Then start outpatient HD on Wednesday.  RNCM confirmed outpatient HD schedule with dialysis liaison.  [facility] [address].  Patient can start outpatient on Wednesday.  Her schedule will be MWF at 4:30pm  Will need order for home tube feeing regimen so tube feeds can be delivered to home.  Patient will have to have qualify O2 sats prior to discharge.  Son states that if patient does not qualify for home O2, he plans to pay out of pocket

## 2018-04-01 LAB — COMPREHENSIVE METABOLIC PANEL
ALT: 31 U/L (ref 0–44)
AST: 58 U/L — ABNORMAL HIGH (ref 15–41)
Albumin: 1.8 g/dL — ABNORMAL LOW (ref 3.5–5.0)
Alkaline Phosphatase: 98 U/L (ref 38–126)
Anion gap: 11 (ref 5–15)
BUN: 72 mg/dL — ABNORMAL HIGH (ref 8–23)
CO2: 25 mmol/L (ref 22–32)
CREATININE: 2.07 mg/dL — AB (ref 0.44–1.00)
Calcium: 9 mg/dL (ref 8.9–10.3)
Chloride: 99 mmol/L (ref 98–111)
GFR calc Af Amer: 24 mL/min — ABNORMAL LOW (ref >60)
GFR calc non Af Amer: 21 mL/min — ABNORMAL LOW (ref >60)
Glucose, Bld: 245 mg/dL — ABNORMAL HIGH (ref 70–99)
Potassium: 4.8 mmol/L (ref 3.5–5.1)
Sodium: 135 mmol/L (ref 135–145)
Total Bilirubin: 0.9 mg/dL (ref 0.3–1.2)
Total Protein: 5.9 g/dL — ABNORMAL LOW (ref 6.5–8.1)

## 2018-04-01 LAB — GLUCOSE, CAPILLARY
Glucose-Capillary: 170 mg/dL — ABNORMAL HIGH (ref 70–99)
Glucose-Capillary: 203 mg/dL — ABNORMAL HIGH (ref 70–99)
Glucose-Capillary: 205 mg/dL — ABNORMAL HIGH (ref 70–99)
Glucose-Capillary: 211 mg/dL — ABNORMAL HIGH (ref 70–99)
Glucose-Capillary: 232 mg/dL — ABNORMAL HIGH (ref 70–99)
Glucose-Capillary: 237 mg/dL — ABNORMAL HIGH (ref 70–99)

## 2018-04-01 MED ORDER — MIDODRINE HCL 5 MG PO TABS
10.0000 mg | ORAL_TABLET | Freq: Once | ORAL | Status: AC
  Administered 2018-04-01: 10 mg via ORAL

## 2018-04-01 NOTE — Progress Notes (Signed)
MD called due for patient low blood pressure

## 2018-04-01 NOTE — Progress Notes (Signed)
[facility]  ROUNDING NOTE   Subjective:   Awake this morning.   Objective:  Vital signs in last 24 hours:  Temp:  [97.5 F (36.4 C)-98.6 F (37 C)] 97.5 F (36.4 C) (03/01 1233) Pulse Rate:  [45-79] 73 (03/01 0620) Resp:  [16-22] 16 (03/01 1233) BP: (55-102)/(29-53) 92/53 (03/01 0620) SpO2:  [96 %-100 %] 100 % (03/01 0451) Weight:  [76.7 kg] 76.7 kg (03/01 0451)  Weight change: 0.2 kg Filed Weights   03/30/18 1959 03/31/18 0500 04/01/18 0451  Weight: 75.1 kg 76.1 kg 76.7 kg    Intake/Output: I/O last 3 completed shifts: In: 240 [NG/GT:240] Out: 1002 [Other:1002]   Intake/Output this shift:  Total I/O In: 978 [NG/GT:978] Out: -   Physical Exam: General:  No acute distress, laying in the bed  Head:  Temporal muscle wasting  Eyes:  Anicteric  Neck:  Supple  Lungs:   clear  Heart:  Irregular rhythm  Abdomen:   Soft, nontender, +G-tube  Extremities:  +++ anasarca  Neurologic:  Awake, will follow simple commands  Access:  RIJ permcath, PD catheter    Basic Metabolic Panel: Recent Labs  Lab 03/29/18 1108 03/29/18 1449 03/30/18 0715 03/30/18 1515  NA 137 138 136  --   K 4.0 3.9 4.2  --   CL 100  --  99  --   CO2 28  --  31  --   GLUCOSE 152* 152* 143*  --   BUN 58*  --  70*  --   CREATININE 1.73*  --  2.12*  --   CALCIUM 8.6*  --  8.9  --   MG 1.9  --   --   --   PHOS  --   --   --  3.1    Liver Function Tests: No results for input(s): AST, ALT, ALKPHOS, BILITOT, PROT, ALBUMIN in the last 168 hours. No results for input(s): LIPASE, AMYLASE in the last 168 hours. No results for input(s): AMMONIA in the last 168 hours.  CBC: Recent Labs  Lab 03/29/18 1108 03/29/18 1449 03/30/18 0715  WBC 5.5  --  5.6  HGB 8.4* 10.5* 8.6*  HCT 29.6* 31.0* 29.7*  MCV 108.0*  --  106.1*  PLT 142*  --  149*    Cardiac Enzymes: No results for input(s): CKTOTAL, CKMB, CKMBINDEX, TROPONINI in the last 168 hours.  BNP: Invalid input(s):  POCBNP  CBG: Recent Labs  Lab 03/31/18 1956 04/01/18 0004 04/01/18 0430 04/01/18 0743 04/01/18 1143  GLUCAP 280* 211* 205* 170* 62*    Microbiology: Results for orders placed or performed during the hospital encounter of 02/26/18  MRSA PCR Screening     Status: Abnormal   Collection Time: 02/26/18  5:39 PM  Result Value Ref Range Status   MRSA by PCR POSITIVE (A) NEGATIVE Final    Comment:        The GeneXpert MRSA Assay (FDA approved for NASAL specimens only), is one component of a comprehensive MRSA colonization surveillance program. It is not intended to diagnose MRSA infection nor to guide or monitor treatment for MRSA infections. CRITICAL RESULT CALLED TO, READ BACK BY AND VERIFIED WITH:  CALLED TO DANA DUGGINS RN @1920  Performed at [facility], [address] [place], [place] [address]   CULTURE, BLOOD (ROUTINE X 2) w Reflex to ID Panel     Status: None   Collection Time: 03/06/18  9:01 PM  Result Value Ref Range Status   Specimen Description  BLOOD LEFT ANTECUBITAL  Final   Special Requests   Final    BOTTLES DRAWN AEROBIC ONLY Blood Culture adequate volume   Culture   Final    NO GROWTH 5 DAYS Performed at [facility], [address] [place], [place] [address]    Report Status 03/11/2018 FINAL  Final  CULTURE, BLOOD (ROUTINE X 2) w Reflex to ID Panel     Status: None   Collection Time: 03/06/18  9:37 PM  Result Value Ref Range Status   Specimen Description BLOOD LEFT ANTECUBITAL  Final   Special Requests   Final    BOTTLES DRAWN AEROBIC AND ANAEROBIC Blood Culture adequate volume   Culture   Final    NO GROWTH 5 DAYS Performed at [facility], [address] [place], [place] [address]    Report Status 03/11/2018 FINAL  Final  MRSA PCR Screening     Status: Abnormal   Collection Time: 03/29/18  8:56 PM  Result Value Ref Range Status   MRSA by PCR POSITIVE (A) NEGATIVE Final    Comment:        The GeneXpert MRSA  Assay (FDA approved for NASAL specimens only), is one component of a comprehensive MRSA colonization surveillance program. It is not intended to diagnose MRSA infection nor to guide or monitor treatment for MRSA infections. RESULT CALLED TO, READ BACK BY AND VERIFIED WITH: HIRAL PATEL 03/29/18 @ 2215   [place] Performed at [facility], [address] [place], [place] [address]     Coagulation Studies: No results for input(s): LABPROT, INR in the last 72 hours.  Urinalysis: No results for input(s): COLORURINE, LABSPEC, PHURINE, GLUCOSEU, HGBUR, BILIRUBINUR, KETONESUR, PROTEINUR, UROBILINOGEN, NITRITE, LEUKOCYTESUR in the last 72 hours.  Invalid input(s): APPERANCEUR    Imaging: No results found.   Medications:   . sodium chloride 0 mL/hr at 03/26/18 0017  . sodium chloride Stopped (03/29/18 1700)  . albumin human 25 g (03/21/18 1256)  . feeding supplement (OSMOLITE 1.5 CAL) 1,000 mL (04/01/18 1227)  . phenylephrine (NEO-SYNEPHRINE) Adult infusion Stopped (03/29/18 2050)   . atorvastatin  40 mg Per Tube q1800  . B-complex with vitamin C  1 tablet Per Tube Daily  . chlorhexidine  15 mL Mouth Rinse BID  . clopidogrel  75 mg Per Tube QHS  . collagenase   Topical Daily  . dextrose  1 ampule Intravenous Once  . epoetin (EPOGEN/PROCRIT) injection  10,000 Units Intravenous Q M,W,F-HD  . famotidine  10 mg Per Tube Daily  . feeding supplement (PRO-STAT SUGAR FREE 64)  30 mL Per Tube BID  . free water  20 mL Per Tube Q4H  . insulin aspart  0-9 Units Subcutaneous Q4H  . mouth rinse  15 mL Mouth Rinse q12n4p  . midodrine  5 mg Per Tube TID WC  . multivitamin  15 mL Per Tube QHS  . mupirocin ointment  1 application Nasal BID  . nutrition supplement (JUVEN)  1 packet Per Tube BID BM   sodium chloride, acetaminophen (TYLENOL) oral liquid 160 mg/5 mL **OR** acetaminophen **OR** acetaminophen, albumin human, albuterol, fentaNYL (SUBLIMAZE) injection, fentaNYL (SUBLIMAZE)  injection, ipratropium, lip balm, Melatonin, metoprolol tartrate, nitroGLYCERIN, ondansetron, polyethylene glycol, promethazine  Assessment/ Plan:  [name] is a 85 y.o. black female with diabetes mellitus type II, hypertension, hyperlipidemia, coronary disease status post CABG, history of stroke, CKD, was admitted on1/27/2020. Originally admitted to [place] hospitalwith Achalasiaand 50 pound weight loss, dysarthria since November 2019.  Now transitioned to [facility] on 02/26/18 to initiate hemodialysis. First hemodialysis treatment on 1/28 through [facility].  PD catheter placed on 03/29/18  1. End Stage Renal Disease with generalized edema   No signs of renal recovery. Anuric Hemodialysis last on Friday.  The tentative plan is for the patient to continue hemodialysis for the next several weeks and then transition the patient to peritoneal dialysis once PD catheter is matured.   Outpatient dialysis center [place]   2. Hypotension: -  midodrine.  3. Anemia of chronic kidney disease:   - EPO with HD treatment  4. Malnutrition with achalasia: albumin 1.8 -Continue tube feeds  - IV albumin ordered with hemodialysis treatment.    LOS: [place] 3/1/20202:30 PM

## 2018-04-01 NOTE — Progress Notes (Signed)
[place] [facility] [place] NAME: [name]    MR#:  [id number]  DATE OF BIRTH:  1927/02/18  SUBJECTIVE:   Patient had peritoneal dialysis catheter placed , Extubated and treansferred to floor, stable. Alert, not very communicative.  REVIEW OF SYSTEMS:  Review of Systems  Unable to perform ROS: Mental status change   DRUG ALLERGIES:   Allergies  Allergen Reactions  . Reglan [Metoclopramide]     Pt's son reports this causes patient tremors and neurologic issues   . Contrast Media [Iodinated Diagnostic Agents]     Sensitivity, kidney issues    VITALS:  Blood pressure (!) 92/53, pulse 73, temperature (!) 97.5 F (36.4 C), temperature source Oral, resp. rate 16, height 5\' 3"  (1.6 m), weight 76.7 kg, SpO2 100 %.  PHYSICAL EXAMINATION:  Physical Exam  GENERAL:  85 y.o.-year-old elderly patient lying in the bed with no acute distress. Non verbal this am EYES: Pupils equal, round, reactive to light . No scleral icterus.  HEENT: Head atraumatic, normocephalic.  NECK:  Supple, no jugular venous distention. No thyroid enlargement, no tenderness.  LUNGS: CTAB Right chest permacath present CARDIOVASCULAR: S1, S2 normal. No  rubs, or gallops.  ABDOMEN: Soft, nontender, nondistended. Bowel sounds present. No organomegaly or mass.  PEG tube in place EXTREMITIES: Anasarca- decreased,  No cyanosis, or clubbing.  NEUROLOGIC: alert, moves right side 1/5, left side extremities 0/5. SKIN:  Has a stage II pressure ulcer in the sacral area. LABORATORY PANEL:   CBC Recent Labs  Lab 03/30/18 0715  WBC 5.6  HGB 8.6*  HCT 29.7*  PLT 149*   ------------------------------------------------------------------------------------------------------------------  Chemistries  Recent Labs  Lab 03/29/18 1108  04/01/18 1339  NA 137   < > 135  K 4.0   < > 4.8  CL 100   < > 99  CO2 28   < > 25  GLUCOSE 152*   < > 245*  BUN 58*   < > 72*  CREATININE  1.73*   < > 2.07*  CALCIUM 8.6*   < > 9.0  MG 1.9  --   --   AST  --   --  58*  ALT  --   --  31  ALKPHOS  --   --  98  BILITOT  --   --  0.9   < > = values in this interval not displayed.   ------------------------------------------------------------------------------------------------------------------  Cardiac Enzymes No results for input(s): TROPONINI in the last 168 hours. ------------------------------------------------------------------------------------------------------------------  RADIOLOGY:  No results found.  EKG:   Orders placed or performed during the hospital encounter of 02/26/18  . EKG 12-Lead  . EKG 12-Lead  . EKG 12-Lead  . EKG 12-Lead    ASSESSMENT AND PLAN:   [name] is an69 y.o.femalewith multiple comorbidities including diabetes type 2, hypertension, hyperlipidemia, coronary disease status post CABG, history of stroke, and CKD who was admitted on1/27/2020. Originally admitted to [place] hospitalwithdysphagia and achalasiaand 50 pound weight loss in 12/2017.Status post treatment for ESBL Klebsiella aspiration pneumonia, enterococcus UTI. Admitted for dialysis.  1.  Acute renal failure on CKD stage III- admitted with uremia and encephalopathy Progressed to end-stage renal disease The tentative plan is for the patient to continue hemodialysis for the next 2-3 weeks and then transition the patient to peritoneal dialysis  2. Acute on chronic combined heart failure-echo with moderate mitral regurgitation and bilateral  pleural effusion.  Continue dialysis as per nephrology  3.  Dysphagia secondary to achalasia- s/p Botox injections but causing pharyngeal muscle weakness.  Currently status post PEG tube and tube feeds.  4.  Diabetes mellitus-appreciate diabetes coordinator's input. On SSI.  5.  Left temporal and frontal CVA-on Plavix and statin  6.  Anemia-likely anemia of chronic disease . -transfusion if hemoglobin is less than 7.  Continue EPO with dialysis.  7.  Acute encephalopathy-initially was in uremic encephalopathy with decreased mentation.    - Family refusing haldol. 8  Hypotension: Continue Midodrin. Blood pressure was on the lower side, increased midodrine.  Patient remains alert and baseline during these episodes.   Overall poor prognosis.  Continue treatment as requested by family.  High risk for deterioration.  Discussed with nursing   CODE STATUS: Full code  TOTAL TIME TAKING CARE OF THIS PATIENT: 25 minutes.  Spoke to her son on phone  Vaughan Basta M.D on 04/01/2018 at 4:39 PM  Between 7am to 6pm - Pager - [phone]  After 6pm go to [email or web address] - password [email or web address] [facility]  Office  [phone]  CC: Primary care physician; Deland Pretty, MD

## 2018-04-01 NOTE — Progress Notes (Signed)
Patient tolerated sitting in recliner for 3 hrs per Dr. Abigail Butts request. [name]

## 2018-04-01 NOTE — Progress Notes (Signed)
[facility] [place] NAME: [name]    MR#:  [id number]  DATE OF BIRTH:  Feb 22, 1927  SUBJECTIVE:   Patient had peritoneal dialysis catheter placed , Extubated and treansferred to floor, stable. Alert, not very communicative.  REVIEW OF SYSTEMS:  Review of Systems  Unable to perform ROS: Mental status change   DRUG ALLERGIES:   Allergies  Allergen Reactions  . Reglan [Metoclopramide]     Pt's son reports this causes patient tremors and neurologic issues   . Contrast Media [Iodinated Diagnostic Agents]     Sensitivity, kidney issues    VITALS:  Blood pressure (!) 92/53, pulse 73, temperature (!) 97.5 F (36.4 C), temperature source Oral, resp. rate 16, height 5\' 3"  (1.6 m), weight 76.7 kg, SpO2 100 %.  PHYSICAL EXAMINATION:  Physical Exam  GENERAL:  85 y.o.-year-old elderly patient lying in the bed with no acute distress. Non verbal this am EYES: Pupils equal, round, reactive to light . No scleral icterus.  HEENT: Head atraumatic, normocephalic.  NECK:  Supple, no jugular venous distention. No thyroid enlargement, no tenderness.  LUNGS: CTAB Right chest permacath present CARDIOVASCULAR: S1, S2 normal. No  rubs, or gallops.  ABDOMEN: Soft, nontender, nondistended. Bowel sounds present. No organomegaly or mass.  PEG tube in place EXTREMITIES: Anasarca- decreased,  No cyanosis, or clubbing.  NEUROLOGIC: alert, moves right side 1/5, left side extremities 0/5. SKIN:  Has a stage II pressure ulcer in the sacral area. LABORATORY PANEL:   CBC Recent Labs  Lab 03/30/18 0715  WBC 5.6  HGB 8.6*  HCT 29.7*  PLT 149*   ------------------------------------------------------------------------------------------------------------------  Chemistries  Recent Labs  Lab 03/29/18 1108  04/01/18 1339  NA 137   < > 135  K 4.0   < > 4.8  CL 100   < > 99  CO2 28   < > 25  GLUCOSE 152*   < > 245*  BUN 58*   < > 72*  CREATININE  1.73*   < > 2.07*  CALCIUM 8.6*   < > 9.0  MG 1.9  --   --   AST  --   --  58*  ALT  --   --  31  ALKPHOS  --   --  98  BILITOT  --   --  0.9   < > = values in this interval not displayed.   ------------------------------------------------------------------------------------------------------------------  Cardiac Enzymes No results for input(s): TROPONINI in the last 168 hours. ------------------------------------------------------------------------------------------------------------------  RADIOLOGY:  No results found.  EKG:   Orders placed or performed during the hospital encounter of 02/26/18  . EKG 12-Lead  . EKG 12-Lead  . EKG 12-Lead  . EKG 12-Lead    ASSESSMENT AND PLAN:   [name] is an85 y.o.femalewith multiple comorbidities including diabetes type 2, hypertension, hyperlipidemia, coronary disease status post CABG, history of stroke, and CKD who was admitted on1/27/2020. [facility] to [place] and achalasiaand 50 pound weight loss in 12/2017.Status post treatment for ESBL Klebsiella aspiration pneumonia, enterococcus UTI. Admitted for dialysis.  1.  Acute renal failure on CKD stage III- admitted with uremia and encephalopathy Progressed to end-stage renal disease The tentative plan is for the patient to continue hemodialysis for the next 2-3 weeks and then transition the patient to peritoneal dialysis  2. Acute on chronic combined heart failure-echo with moderate mitral regurgitation and bilateral  pleural effusion.  Continue dialysis as per nephrology  3.  Dysphagia secondary to achalasia- s/p Botox injections but causing pharyngeal muscle weakness.  Currently status post PEG tube and tube feeds.  4.  Diabetes mellitus-appreciate diabetes coordinator's input. On SSI.  5.  Left temporal and frontal CVA-on Plavix and statin  6.  Anemia-likely anemia of chronic disease . -transfusion if hemoglobin is less than 7.  Continue EPO with dialysis.  7.  Acute encephalopathy-initially was in uremic encephalopathy with decreased mentation.    - Family refusing haldol. 8  Hypotension: Continue Midodrin. Blood pressure is better.   Overall poor prognosis.  Continue treatment as requested by family.  High risk for deterioration.  Discussed with nursing   CODE STATUS: Full code  TOTAL TIME TAKING CARE OF THIS PATIENT: 25 minutes.  Spoke to her son on phone  Vaughan Basta M.D on 04/01/2018 at 4:35 PM  Between 7am to 6pm - Pager - [phone]  After 6pm go to [email or web address] - password [email or web address] [facility]  Office  [phone]  CC: Primary care physician; Deland Pretty, MD

## 2018-04-02 LAB — GLUCOSE, CAPILLARY
Glucose-Capillary: 173 mg/dL — ABNORMAL HIGH (ref 70–99)
Glucose-Capillary: 182 mg/dL — ABNORMAL HIGH (ref 70–99)
Glucose-Capillary: 209 mg/dL — ABNORMAL HIGH (ref 70–99)
Glucose-Capillary: 250 mg/dL — ABNORMAL HIGH (ref 70–99)
Glucose-Capillary: 282 mg/dL — ABNORMAL HIGH (ref 70–99)
Glucose-Capillary: 304 mg/dL — ABNORMAL HIGH (ref 70–99)

## 2018-04-02 MED ORDER — PROMETHAZINE HCL 6.25 MG/5ML PO SYRP: 6.2500 mg | ORAL_SOLUTION | Freq: Four times a day (QID) | ORAL | 1 refills | Status: DC | PRN

## 2018-04-02 MED ORDER — MELATONIN 3 MG PO TABS: 3.0000 mg | ORAL_TABLET | Freq: Every evening | ORAL | 0 refills | Status: AC | PRN

## 2018-04-02 MED ORDER — JUVEN PO PACK: 1.0000 | PACK | Freq: Two times a day (BID) | ORAL | 2 refills | Status: AC

## 2018-04-02 MED ORDER — COLLAGENASE 250 UNIT/GM EX OINT: TOPICAL_OINTMENT | Freq: Every day | CUTANEOUS | 0 refills | Status: AC

## 2018-04-02 MED ORDER — INSULIN ASPART 100 UNIT/ML ~~LOC~~ SOLN: 0.0000 [IU] | SUBCUTANEOUS | 11 refills | Status: DC

## 2018-04-02 MED ORDER — MIDODRINE HCL 5 MG PO TABS: 5.0000 mg | ORAL_TABLET | Freq: Three times a day (TID) | ORAL | 0 refills | Status: AC

## 2018-04-02 MED ORDER — ATORVASTATIN CALCIUM 40 MG PO TABS: 40.0000 mg | ORAL_TABLET | Freq: Every day | ORAL | 0 refills | Status: AC

## 2018-04-02 MED ORDER — MIDODRINE HCL 5 MG PO TABS: 5.0000 mg | ORAL_TABLET | Freq: Three times a day (TID) | ORAL | 0 refills | Status: DC

## 2018-04-02 MED ORDER — PROMETHAZINE HCL 6.25 MG/5ML PO SYRP: 6.2500 mg | ORAL_SOLUTION | Freq: Four times a day (QID) | ORAL | 1 refills | Status: AC | PRN

## 2018-04-02 MED ORDER — OSMOLITE 1.5 CAL PO LIQD: 1000.0000 mL | ORAL | 5 refills | Status: AC

## 2018-04-02 MED ORDER — INSULIN ASPART 100 UNIT/ML ~~LOC~~ SOLN: 0.0000 [IU] | SUBCUTANEOUS | 11 refills | Status: AC

## 2018-04-02 MED ORDER — CLOPIDOGREL BISULFATE 75 MG PO TABS: 75.0000 mg | ORAL_TABLET | Freq: Every day | ORAL | 0 refills | Status: AC

## 2018-04-02 MED ORDER — POLYETHYLENE GLYCOL 3350 17 G PO PACK: 17.0000 g | PACK | Freq: Every day | ORAL | 0 refills | Status: AC | PRN

## 2018-04-02 MED ORDER — B COMPLEX-C PO TABS: 1.0000 | ORAL_TABLET | Freq: Every day | ORAL | 0 refills | Status: AC

## 2018-04-02 MED ORDER — PRO-STAT SUGAR FREE PO LIQD: 30.0000 mL | Freq: Two times a day (BID) | ORAL | 0 refills | Status: AC

## 2018-04-02 NOTE — Progress Notes (Signed)
Hemodialysis treatment completed at 1350. Net UF removed 162 mL. Blood rinsed back,  CVC locked with NS and locked and red caps replaced. Dressing remains intact and insertion site remains without signs and symptoms of infection.  Post hemodialysis report given to L. Lenard Simmer, RN.    04/02/18 1350  Hand-Off documentation  Report given to (Full Name) L. Lenard Simmer, RN  Vital Signs  Temp 98 F (36.7 C)  Temp Source Axillary  Pulse Rate 92  Resp 20  BP 91/60  BP Location Right Arm  BP Method Automatic  Oxygen Therapy  SpO2 98 %  O2 Flow Rate (L/min) 4 L/min  Pain Assessment  Pain Scale Faces  Faces Pain Scale 2  Pain Type Chronic pain  Pain Location Generalized  Post-Hemodialysis Assessment  Rinseback Volume (mL) 250 mL  Dialyzer Clearance Lightly streaked  Duration of HD Treatment -hour(s) 3 hour(s)  Hemodialysis Intake (mL) 500 mL  UF Total -Machine (mL) 652 mL  Net UF (mL) 152 mL  Tolerated HD Treatment Yes  Education / Care Plan  Dialysis Education Provided Yes  Documented Education in Care Plan Yes  Hemodialysis Catheter Right Subclavian Double-lumen;Permanent  Placement Date: 03/05/18   Orientation: Right  Access Location: Subclavian  Hemodialysis Catheter Type: Double-lumen;Permanent  Site Condition No complications  Blue Lumen Status Saline locked  Red Lumen Status Saline locked  Dressing Status Clean;Dry;Intact

## 2018-04-02 NOTE — Discharge Summary (Signed)
[facility] [place] NAME: [name]    MR#:  [id number]  DATE OF BIRTH:  1927-09-22  SUBJECTIVE:   Patient had peritoneal dialysis catheter placed , Extubated and treansferred to floor, stable. Alert, not very communicative.  Remains stable.  Seen during hemodialysis today.  REVIEW OF SYSTEMS:  Review of Systems  Unable to perform ROS: Mental status change   DRUG ALLERGIES:   Allergies  Allergen Reactions  . Reglan [Metoclopramide]     Pt's son reports this causes patient tremors and neurologic issues   . Contrast Media [Iodinated Diagnostic Agents]     Sensitivity, kidney issues    VITALS:  Blood pressure (!) 143/77, pulse 87, temperature 98 F (36.7 C), temperature source Axillary, resp. rate 20, height 5\' 3"  (1.6 m), weight 77.5 kg, SpO2 98 %.  PHYSICAL EXAMINATION:  Physical Exam  GENERAL:  85 y.o.-year-old elderly patient lying in the bed with no acute distress. Non verbal this am EYES: Pupils equal, round, reactive to light . No scleral icterus.  HEENT: Head atraumatic, normocephalic.  NECK:  Supple, no jugular venous distention. No thyroid enlargement, no tenderness.  LUNGS: CTAB Right chest permacath present CARDIOVASCULAR: S1, S2 normal. No  rubs, or gallops.  ABDOMEN: Soft, nontender, nondistended. Bowel sounds present. No organomegaly or mass.  PEG tube in place EXTREMITIES: Anasarca- decreased,  No cyanosis, or clubbing.  NEUROLOGIC: alert, moves right side 1/5, left side extremities 0/5. SKIN:  Has a stage II pressure ulcer in the sacral area.  LABORATORY PANEL:   CBC Recent Labs  Lab 03/30/18 0715  WBC 5.6  HGB 8.6*  HCT 29.7*  PLT 149*   ------------------------------------------------------------------------------------------------------------------  Chemistries  Recent Labs  Lab 03/29/18 1108  04/01/18 1339  NA 137   < > 135  K 4.0   < > 4.8  CL 100   < > 99  CO2 28   < > 25  GLUCOSE  152*   < > 245*  BUN 58*   < > 72*  CREATININE 1.73*   < > 2.07*  CALCIUM 8.6*   < > 9.0  MG 1.9  --   --   AST  --   --  58*  ALT  --   --  31  ALKPHOS  --   --  98  BILITOT  --   --  0.9   < > = values in this interval not displayed.   ------------------------------------------------------------------------------------------------------------------  Cardiac Enzymes No results for input(s): TROPONINI in the last 168 hours. ------------------------------------------------------------------------------------------------------------------  RADIOLOGY:  No results found.  EKG:   Orders placed or performed during the hospital encounter of 02/26/18  . EKG 12-Lead  . EKG 12-Lead  . EKG 12-Lead  . EKG 12-Lead    ASSESSMENT AND PLAN:   Minnie Legros is an42 y.o.femalewith multiple comorbidities including diabetes type 2, hypertension, hyperlipidemia, coronary disease status post CABG, history of stroke, and CKD who was admitted on1/27/2020. Originally admitted to [facility]withdysphagia and achalasiaand 50 pound weight loss in 12/2017.Status post treatment for ESBL Klebsiella aspiration pneumonia, enterococcus UTI. Admitted for dialysis.  1.  Acute renal failure on CKD stage III- admitted with uremia and encephalopathy Progressed to end-stage renal disease The tentative plan is for the patient to continue hemodialysis for the next 2-3 weeks and then transition the patient to peritoneal dialysis  2. Acute on chronic combined heart failure-echo with moderate mitral regurgitation and bilateral  pleural effusion.  Continue dialysis as per nephrology Due to her pulmonary edema and pleural effusions, she continued to require 4 L oxygen, stable in hospital. Prescribed oxygen at home at the time of discharge.  3. Dysphagia secondary to achalasia- s/p Botox injections but causing pharyngeal muscle weakness.  Currently status post PEG tube and tube feeds. Prescriptions  provided to resume feeding at home.  It is okay to clamp the PEG tube today at the time of discharge while being transported home and resume feeding once at home.  4.  Diabetes mellitus-appreciate diabetes coordinator's input. On SSI.  5.  Left temporal and frontal CVA-on Plavix and statin  6.  Anemia-likely anemia of chronic disease . -transfusion if hemoglobin is less than 7. Continue EPO with dialysis.  7.  Acute encephalopathy-initially was in uremic encephalopathy with decreased mentation.   Improved and comfortable and stable for last many days.  8  Hypotension: Continue Midodrin. Blood pressure was on the lower side, increased midodrine.  Patient remains alert and baseline during these episodes. She was given albumin injections during hemodialysis.  Overall poor prognosis.  Continue treatment as requested by family.  High risk for deterioration and readmissions.  Discussed with nursing, case manager, nephrologist, patient's son for making discharge arrangements.   CODE STATUS: Full code  TOTAL TIME TAKING CARE OF THIS PATIENT: 55 minutes.  Spoke to her son on phone  Vaughan Basta M.D on 04/02/2018 at 3:22 PM  Between 7am to 6pm - Pager - [phone]  After 6pm go to [email or web address] - password [email or web address] [facility]  Office  [phone]  CC: Primary care physician; Deland Pretty, MD

## 2018-04-02 NOTE — Discharge Summary (Signed)
[place] [facility] [place] NAME: [name]    MR#:  [id number]  DATE OF BIRTH:  1927-04-22  DATE OF ADMISSION:  02/26/2018 ADMITTING PHYSICIAN: Fritzi Mandes, MD  DATE OF DISCHARGE: 04/02/2018   PRIMARY CARE PHYSICIAN: Deland Pretty, MD    ADMISSION DIAGNOSIS:  Dialysis pt   DISCHARGE DIAGNOSIS:  Principal Problem:   Acute renal failure (ARF) ([place]) Active Problems:   Acute respiratory failure (HCC)   Feeding tube dysfunction   Paroxysmal atrial fibrillation (HCC)   Unstageable pressure ulcer of sacral region [facility])   SECONDARY DIAGNOSIS:   Past Medical History:  Diagnosis Date  . Acute renal injury ([place]) 07/2016  . Anginal pain ([place]) 2004; 2011  . CAD (coronary artery disease)    status post stenting of the RCA status post CABG. (left internal mammary artery to  LAD, saphenous vein graft to second diagonal, saphenous vein  graft to obtuse marginal 1, saphenous vein graft to posterior  descending).  . Cardiomyopathy     Mildly reduced EF (45%).   . Chronic kidney disease (CKD), stage III (moderate) ([place])   . History of blood transfusion 1960s   "related to anemia"  . HTN (hypertension)   . Pleural effusion 2011   S/P "bypass"  . Shortness of breath   . Stroke ([place]) 09/19/2016   "left sided weakness" (09/20/2016)  . Type II diabetes mellitus [facility])     HOSPITAL COURSE:   [name] is an85 y.o.femalewith multiple comorbidities including diabetes type 2, hypertension, hyperlipidemia, coronary disease status post CABG, history of stroke, and CKD who was admitted on1/27/2020. [facility] [place] [facility] and achalasiaand 50 pound weight loss in 12/2017.Status post treatment for ESBL Klebsiella aspiration pneumonia, enterococcus UTI. Admitted for dialysis.  1.  Acute renal failure on CKD stage III- admitted with uremia and encephalopathy Progressed to end-stage renal disease The tentative  plan is for the patient to continue hemodialysis for the next 2-3 weeks and then transition the patient to peritoneal dialysis  2. Acute on chronic combined heart failure-echo with moderate mitral regurgitation and bilateral  pleural effusion.  Continue dialysis as per nephrology Due to her pulmonary edema and pleural effusions, she continued to require 4 L oxygen, stable in hospital. Prescribed oxygen at home at the time of discharge.  3. Dysphagia secondary to achalasia- s/p Botox injections but causing pharyngeal muscle weakness.  Currently status post PEG tube and tube feeds. Prescriptions provided to resume feeding at home.  It is okay to clamp the PEG tube today at the time of discharge while being transported home and resume feeding once at home.  4.  Diabetes mellitus-appreciate diabetes coordinator's input. On SSI.  5.  Left temporal and frontal CVA-on Plavix and statin  6.  Anemia-likely anemia of chronic disease . -transfusion if hemoglobin is less than 7. Continue EPO with dialysis.  7.  Acute encephalopathy-initially was in uremic encephalopathy with decreased mentation.   Improved and comfortable and stable for last many days.  8  Hypotension: Continue Midodrin. Blood pressure was on the lower side, increased midodrine.  Patient remains alert and baseline during these episodes. She was given albumin injections during hemodialysis.  Overall poor prognosis.  Continue treatment as requested by family.  High risk for deterioration and readmissions.  Discussed with nursing, case manager, nephrologist, patient's son for making discharge arrangements.  DISCHARGE CONDITIONS:   Stable.  CONSULTS OBTAINED:  Treatment Team:  Murlean Iba, MD Dustin Flock,  MD  DRUG ALLERGIES:   Allergies  Allergen Reactions  . Reglan [Metoclopramide]     Pt's son reports this causes patient tremors and neurologic issues   . Contrast Media [Iodinated Diagnostic Agents]      Sensitivity, kidney issues    DISCHARGE MEDICATIONS:   Allergies as of 04/02/2018      Reactions   Reglan [metoclopramide]    Pt's son reports this causes patient tremors and neurologic issues    Contrast Media [iodinated Diagnostic Agents]    Sensitivity, kidney issues      Medication List    STOP taking these medications   amLODipine 10 MG tablet Commonly known as:  NORVASC   feeding supplement (NEPRO CARB STEADY) Liqd Replaced by:  nutrition supplement (JUVEN) Pack   ferrous sulfate 300 (60 Fe) MG/5ML syrup   glipiZIDE 10 MG 24 hr tablet Commonly known as:  GLUCOTROL XL   isosorbide mononitrate 30 MG 24 hr tablet Commonly known as:  IMDUR   metoprolol tartrate 50 MG tablet Commonly known as:  LOPRESSOR   ondansetron 4 MG/2ML Soln injection Commonly known as:  ZOFRAN   patiromer 8.4 g packet Commonly known as:  VELTASSA   promethazine 25 MG/ML injection Commonly known as:  PHENERGAN Replaced by:  promethazine 6.25 MG/5ML syrup     TAKE these medications   acetaminophen 160 MG/5ML solution Commonly known as:  TYLENOL Place 15.6 mLs (500 mg total) into feeding tube every 6 (six) hours as needed for mild pain or fever.   albuterol (2.5 MG/3ML) 0.083% nebulizer solution Commonly known as:  PROVENTIL Take 3 mLs (2.5 mg total) by nebulization every 4 (four) hours as needed for wheezing or shortness of breath.   atorvastatin 40 MG tablet Commonly known as:  LIPITOR Place 1 tablet (40 mg total) into feeding tube daily at 6 PM.   B-complex with vitamin C tablet Place 1 tablet into feeding tube daily.   chlorhexidine 0.12 % solution Commonly known as:  PERIDEX 15 mLs by Mouth Rinse route 2 (two) times daily.   cholecalciferol 10 MCG (400 UNIT) Tabs tablet Commonly known as:  VITAMIN D3 Place 1 tablet (400 Units total) into feeding tube daily.   clopidogrel 75 MG tablet Commonly known as:  PLAVIX Place 1 tablet (75 mg total) into feeding tube daily.    collagenase ointment Commonly known as:  SANTYL Apply topically daily.   famotidine 10 MG tablet Commonly known as:  PEPCID Place 1 tablet (10 mg total) into feeding tube daily.   feeding supplement (PRO-STAT SUGAR FREE 64) Liqd Place 30 mLs into feeding tube 2 (two) times daily.   free water Soln Place 30 mLs into feeding tube every 8 (eight) hours.   insulin aspart 100 UNIT/ML injection Commonly known as:  novoLOG Inject 0-9 Units into the skin every 4 (four) hours. CBG 70 - 120: 0 units CBG 121 - 150: 1 unit CBG 151 - 200: 2 units CBG 201 - 250: 3 units CBG 251 - 300: 5 units CBG 301 - 350: 7 units CBG 351 - 400: 9 units   ipratropium 0.02 % nebulizer solution Commonly known as:  ATROVENT Take 2.5 mLs (0.5 mg total) by nebulization every 6 (six) hours as needed for wheezing or shortness of breath.   Melatonin 3 MG Tabs Take 1 tablet (3 mg total) by mouth at bedtime as needed (insomnia).   midodrine 5 MG tablet Commonly known as:  PROAMATINE Place 1 tablet (5 mg total) into feeding  tube 3 (three) times daily with meals.   mouth rinse Liqd solution 15 mLs by Mouth Rinse route 2 times daily at 12 noon and 4 pm.   multivitamin Liqd Place 15 mLs into feeding tube daily.   nitroGLYCERIN 0.4 MG SL tablet Commonly known as:  NITROSTAT Place 1 tablet (0.4 mg total) under the tongue every 5 (five) minutes x 3 doses as needed for chest pain.   nutrition supplement (JUVEN) Pack Place 1 packet into feeding tube 2 (two) times daily between meals. Replaces:  feeding supplement (NEPRO CARB STEADY) Liqd   feeding supplement (OSMOLITE 1.5 CAL) Liqd Place 1,000 mLs into feeding tube continuous. 40 ml/hour continuous.   polyethylene glycol packet Commonly known as:  MIRALAX / GLYCOLAX Take 17 g by mouth daily as needed for mild constipation.   promethazine 6.25 MG/5ML syrup Commonly known as:  PHENERGAN Place 5 mLs (6.25 mg total) into feeding tube 4 (four) times daily as  needed for nausea or vomiting. Replaces:  promethazine 25 MG/ML injection            Durable Medical Equipment  (From admission, onward)         Start     Ordered   04/02/18 1520  For home use only DME oxygen  Once    Comments:  ESRD, Pulmonary edema, anasarca, CHF  Question Answer Comment  Mode or (Route) Nasal cannula   Liters per Minute 4   Frequency Continuous (stationary and portable oxygen unit needed)   Oxygen conserving device Yes   Oxygen delivery system Gas      04/02/18 1520   03/22/18 1221  For home use only DME Other see comment  Once    Comments:  Gel cushion for reclining WC   03/22/18 1221   03/21/18 1548  For home use only DME standard manual wheelchair with seat cushion  Once    Comments:  high-back, reclining wheelchair with head support, articulating leg rests, anti-tip bars, removable arm rests and a self-releasing seat bel   03/21/18 1548           DISCHARGE INSTRUCTIONS:    Follow with nephrology in  2 weeks. COnt HD and transition to PD in next few weeks as advised by nephrologist.  If you experience worsening of your admission symptoms, develop shortness of breath, life threatening emergency, suicidal or homicidal thoughts you must seek medical attention immediately by calling 911 or calling your MD immediately  if symptoms less severe.  You Must read complete instructions/literature along with all the possible adverse reactions/side effects for all the Medicines you take and that have been prescribed to you. Take any new Medicines after you have completely understood and accept all the possible adverse reactions/side effects.   Please note  You were cared for by a hospitalist during your hospital stay. If you have any questions about your discharge medications or the care you received while you were in the hospital after you are discharged, you can call the unit and asked to speak with the hospitalist on call if the hospitalist that took care  of you is not available. Once you are discharged, your primary care physician will handle any further medical issues. Please note that NO REFILLS for any discharge medications will be authorized once you are discharged, as it is imperative that you return to your primary care physician (or establish a relationship with a primary care physician if you do not have one) for your aftercare needs so that they  can reassess your need for medications and monitor your lab values.    Today   CHIEF COMPLAINT:  No chief complaint on file.   HISTORY OF PRESENT ILLNESS:  [name]  is a 85 y.o. female with a known history of acute metabolic encephalopathy, combined systolic diastolic congestive heart failure moderate MR, acute CVA, ESBL Klebsiella aspiration pneumonia and enterococcus UTI, acute non-STEMI was admitted at [facility] in early part of November with weight loss and dysphagia due to achalasia. Patient had a very complicated course at [facility] over the last couple months. Kindly review [facility] medical records for details.  Patient apparently has been transferred to [facility] for trial of hemodialysis. She was declined for transfer at other tertiary care centers and family request patient given a trial of hemodialysis.  Nephrology Dr. Candiss Norse aware about patient and was agreeable with patient being transferred to [facility] regional.  Patient currently appears chronically ill, debilitated, and [place]. She opens eyes to verbal command. She is unable to hold any meaningful conversation. Daughter at bedside.   VITAL SIGNS:  Blood pressure (!) 143/77, pulse 87, temperature 98 F (36.7 C), temperature source Axillary, resp. rate 20, height 5\' 3"  (1.6 m), weight 77.5 kg, SpO2 98 %.  I/O:    Intake/Output Summary (Last 24 hours) at 04/02/2018 1527 Last data filed at 04/02/2018 1350 Gross per 24 hour  Intake 588 ml  Output 888 ml  Net -300 ml    PHYSICAL EXAMINATION:   GENERAL:   85 y.o.-year-old elderly patient lying in the bed with no acute distress. Non verbal this am EYES: Pupils equal, round, reactive to light . No scleral icterus.  HEENT: Head atraumatic, normocephalic.  NECK:  Supple, no jugular venous distention. No thyroid enlargement, no tenderness.  LUNGS: CTAB Right chest permacath present CARDIOVASCULAR: S1, S2 normal. No  rubs, or gallops.  ABDOMEN: Soft, nontender, nondistended. Bowel sounds present. No organomegaly or mass.  PEG tube in place EXTREMITIES: Anasarca- decreased,  No cyanosis, or clubbing.  NEUROLOGIC: alert, moves right side 1/5, left side extremities 0/5. SKIN:  Has a stage II pressure ulcer in the sacral area.  DATA REVIEW:   CBC Recent Labs  Lab 03/30/18 0715  WBC 5.6  HGB 8.6*  HCT 29.7*  PLT 149*    Chemistries  Recent Labs  Lab 03/29/18 1108  04/01/18 1339  NA 137   < > 135  K 4.0   < > 4.8  CL 100   < > 99  CO2 28   < > 25  GLUCOSE 152*   < > 245*  BUN 58*   < > 72*  CREATININE 1.73*   < > 2.07*  CALCIUM 8.6*   < > 9.0  MG 1.9  --   --   AST  --   --  58*  ALT  --   --  31  ALKPHOS  --   --  98  BILITOT  --   --  0.9   < > = values in this interval not displayed.    Cardiac Enzymes No results for input(s): TROPONINI in the last 168 hours.  Microbiology Results  Results for orders placed or performed during the hospital encounter of 02/26/18  MRSA PCR Screening     Status: Abnormal   Collection Time: 02/26/18  5:39 PM  Result Value Ref Range Status   MRSA by PCR POSITIVE (A) NEGATIVE Final    Comment:  The GeneXpert MRSA Assay (FDA approved for NASAL specimens only), is one component of a comprehensive MRSA colonization surveillance program. It is not intended to diagnose MRSA infection nor to guide or monitor treatment for MRSA infections. CRITICAL RESULT CALLED TO, READ BACK BY AND VERIFIED WITH:  CALLED TO DANA DUGGINS RN @1920  Performed at [facility], [address] [place], [place] [address]   CULTURE, BLOOD (ROUTINE X 2) w Reflex to ID Panel     Status: None   Collection Time: 03/06/18  9:01 PM  Result Value Ref Range Status   Specimen Description BLOOD LEFT ANTECUBITAL  Final   Special Requests   Final    BOTTLES DRAWN AEROBIC ONLY Blood Culture adequate volume   Culture   Final    NO GROWTH 5 DAYS Performed at [facility], [address] [place], [place] [address]    Report Status 03/11/2018 FINAL  Final  CULTURE, BLOOD (ROUTINE X 2) w Reflex to ID Panel     Status: None   Collection Time: 03/06/18  9:37 PM  Result Value Ref Range Status   Specimen Description BLOOD LEFT ANTECUBITAL  Final   Special Requests   Final    BOTTLES DRAWN AEROBIC AND ANAEROBIC Blood Culture adequate volume   Culture   Final    NO GROWTH 5 DAYS Performed at [facility], [address] [place], [place] [address]    Report Status 03/11/2018 FINAL  Final  MRSA PCR Screening     Status: Abnormal   Collection Time: 03/29/18  8:56 PM  Result Value Ref Range Status   MRSA by PCR POSITIVE (A) NEGATIVE Final    Comment:        The GeneXpert MRSA Assay (FDA approved for NASAL specimens only), is one component of a comprehensive MRSA colonization surveillance program. It is not intended to diagnose MRSA infection nor to guide or monitor treatment for MRSA infections. RESULT CALLED TO, READ BACK BY AND VERIFIED WITH: HIRAL PATEL 03/29/18 @ 2215   [facility] Performed at [facility], [address], [place], [place] [address]     RADIOLOGY:  No results found.  EKG:   Orders placed or performed during the hospital encounter of 02/26/18  . EKG 12-Lead  . EKG 12-Lead  . EKG 12-Lead  . EKG 12-Lead      Management plans discussed with the patient, family and they are in agreement.  CODE STATUS:     Code Status Orders  (From admission, onward)         Start     Ordered   02/26/18 2010  Full code  Continuous     02/26/18  2009        Code Status History    Date Active Date Inactive Code Status Order ID Comments User Context   12/05/2017 1948 02/26/2018 1338 Full Code [id number]  Janora Norlander, MD Inpatient   11/09/2017 0011 11/25/2017 0150 Full Code [id number]  Reubin Milan, MD Inpatient   11/04/2017 1529 11/09/2017 0011 Full Code [id number]  Toy Baker, MD Inpatient   10/13/2016 0410 10/15/2016 2144 Full Code [id number]  Edwin Dada, MD Inpatient   10/06/2016 1657 10/08/2016 1758 Full Code [id number]  Reola Mosher Inpatient   09/22/2016 1831 10/06/2016 1631 Full Code [id number]  Cathlyn Parsons, PA-C Inpatient   09/22/2016 1831 09/22/2016 1831 Full Code [id number]  Elizabeth Sauer Inpatient   09/20/2016 1556 09/22/2016 1810 Full Code [id number]  [name]    Advance Directive Documentation     Most Recent Value  Type of Advance Directive  Healthcare Power of Attorney  Pre-existing out of facility DNR order (yellow form or pink MOST form)  -  "MOST" Form in Place?  -      TOTAL TIME TAKING CARE OF THIS PATIENT: 55 minutes.    Vaughan Basta M.D on 04/02/2018 at 3:27 PM  Between 7am to 6pm - Pager - [phone]  After 6pm go to [email or web address] - password [email or web address] [facility]  Office  [phone]  CC: Primary care physician; Deland Pretty, MD   Note: This dictation was prepared with Dragon dictation along with smaller phrase technology. Any transcriptional errors that result from this process are unintentional.

## 2018-04-02 NOTE — Progress Notes (Signed)
[facility]  ROUNDING NOTE   Subjective:   Seen and examined on hemodialysis treatment. Seated in chair.     HEMODIALYSIS FLOWSHEET:  Blood Flow Rate (mL/min): 300 mL/min Arterial Pressure (mmHg): -110 mmHg Venous Pressure (mmHg): 90 mmHg Transmembrane Pressure (mmHg): 50 mmHg Ultrafiltration Rate (mL/min): 340 mL/min Dialysate Flow Rate (mL/min): 600 ml/min Conductivity: Machine : 13.9 Conductivity: Machine : 13.9 Dialysis Fluid Bolus: Normal Saline Bolus Amount (mL): 250 mL Dialysate Change: (MD at bedside, 3K bath)    Objective:  Vital signs in last 24 hours:  Temp:  [98.4 F (36.9 C)-98.8 F (37.1 C)] 98.8 F (37.1 C) (03/02 1041) Pulse Rate:  [39-125] 78 (03/02 1300) Resp:  [14-21] 14 (03/02 1300) BP: (79-101)/(39-71) 89/57 (03/02 1300) SpO2:  [81 %-98 %] 98 % (03/02 1041) Weight:  [77.5 kg] 77.5 kg (03/02 0500)  Weight change: 0.8 kg Filed Weights   03/31/18 0500 04/01/18 0451 04/02/18 0500  Weight: 76.1 kg 76.7 kg 77.5 kg    Intake/Output: I/O last 3 completed shifts: In: 1566 [NG/GT:1566] Out: 580 [Stool:580]   Intake/Output this shift:  No intake/output data recorded.  Physical Exam: General:  No acute distress, laying in the bed  Head:  Temporal muscle wasting  Eyes:  Anicteric  Neck:  Supple  Lungs:   clear  Heart:  Irregular rhythm  Abdomen:   Soft, nontender, +G-tube  Extremities:  +++ anasarca  Neurologic:  Awake, will follow simple commands  Access:  RIJ permcath, PD catheter    Basic Metabolic Panel: Recent Labs  Lab 03/29/18 1108 03/29/18 1449 03/30/18 0715 03/30/18 1515 04/01/18 1339  NA 137 138 136  --  135  K 4.0 3.9 4.2  --  4.8  CL 100  --  99  --  99  CO2 28  --  31  --  25  GLUCOSE 152* 152* 143*  --  245*  BUN 58*  --  70*  --  72*  CREATININE 1.73*  --  2.12*  --  2.07*  CALCIUM 8.6*  --  8.9  --  9.0  MG 1.9  --   --   --   --   PHOS  --   --   --  3.1  --     Liver Function Tests: Recent Labs   Lab 04/01/18 1339  AST 58*  ALT 31  ALKPHOS 98  BILITOT 0.9  PROT 5.9*  ALBUMIN 1.8*   No results for input(s): LIPASE, AMYLASE in the last 168 hours. No results for input(s): AMMONIA in the last 168 hours.  CBC: Recent Labs  Lab 03/29/18 1108 03/29/18 1449 03/30/18 0715  WBC 5.5  --  5.6  HGB 8.4* 10.5* 8.6*  HCT 29.6* 31.0* 29.7*  MCV 108.0*  --  106.1*  PLT 142*  --  149*    Cardiac Enzymes: No results for input(s): CKTOTAL, CKMB, CKMBINDEX, TROPONINI in the last 168 hours.  BNP: Invalid input(s): POCBNP  CBG: Recent Labs  Lab 04/01/18 1708 04/01/18 2009 04/02/18 0015 04/02/18 0548 04/02/18 0726  GLUCAP 232* 203* 250* 304* 6*    Microbiology: Results for orders placed or performed during the hospital encounter of 02/26/18  MRSA PCR Screening     Status: Abnormal   Collection Time: 02/26/18  5:39 PM  Result Value Ref Range Status   MRSA by PCR POSITIVE (A) NEGATIVE Final    Comment:        The GeneXpert MRSA Assay (FDA approved for NASAL  specimens only), is one component of a comprehensive MRSA colonization surveillance program. It is not intended to diagnose MRSA infection nor to guide or monitor treatment for MRSA infections. CRITICAL RESULT CALLED TO, READ BACK BY AND VERIFIED WITH:  CALLED TO DANA DUGGINS RN @1920  Performed at [facility], [address] [place], [place] [address]   CULTURE, BLOOD (ROUTINE X 2) w Reflex to ID Panel     Status: None   Collection Time: 03/06/18  9:01 PM  Result Value Ref Range Status   Specimen Description BLOOD LEFT ANTECUBITAL  Final   Special Requests   Final    BOTTLES DRAWN AEROBIC ONLY Blood Culture adequate volume   Culture   Final    NO GROWTH 5 DAYS Performed at [facility], [address] [place], [place] [address]    Report Status 03/11/2018 FINAL  Final  CULTURE, BLOOD (ROUTINE X 2) w Reflex to ID Panel     Status: None   Collection Time: 03/06/18  9:37 PM  Result  Value Ref Range Status   Specimen Description BLOOD LEFT ANTECUBITAL  Final   Special Requests   Final    BOTTLES DRAWN AEROBIC AND ANAEROBIC Blood Culture adequate volume   Culture   Final    NO GROWTH 5 DAYS Performed at [facility], [address] [place], [place] [address]    Report Status 03/11/2018 FINAL  Final  MRSA PCR Screening     Status: Abnormal   Collection Time: 03/29/18  8:56 PM  Result Value Ref Range Status   MRSA by PCR POSITIVE (A) NEGATIVE Final    Comment:        The GeneXpert MRSA Assay (FDA approved for NASAL specimens only), is one component of a comprehensive MRSA colonization surveillance program. It is not intended to diagnose MRSA infection nor to guide or monitor treatment for MRSA infections. RESULT CALLED TO, READ BACK BY AND VERIFIED WITH: HIRAL PATEL 03/29/18 @ 2215   [place] Performed at [facility], [address] [place],  [address]     Coagulation Studies: No results for input(s): LABPROT, INR in the last 72 hours.  Urinalysis: No results for input(s): COLORURINE, LABSPEC, PHURINE, GLUCOSEU, HGBUR, BILIRUBINUR, KETONESUR, PROTEINUR, UROBILINOGEN, NITRITE, LEUKOCYTESUR in the last 72 hours.  Invalid input(s): APPERANCEUR    Imaging: No results found.   Medications:   . sodium chloride 0 mL/hr at 03/26/18 0017  . sodium chloride Stopped (03/29/18 1700)  . albumin human 25 g (04/02/18 1112)  . feeding supplement (OSMOLITE 1.5 CAL) 1,000 mL (04/01/18 1227)  . phenylephrine (NEO-SYNEPHRINE) Adult infusion Stopped (03/29/18 2050)   . atorvastatin  40 mg Per Tube q1800  . B-complex with vitamin C  1 tablet Per Tube Daily  . chlorhexidine  15 mL Mouth Rinse BID  . clopidogrel  75 mg Per Tube QHS  . collagenase   Topical Daily  . dextrose  1 ampule Intravenous Once  . epoetin (EPOGEN/PROCRIT) injection  10,000 Units Intravenous Q M,W,F-HD  . famotidine  10 mg Per Tube Daily  . feeding supplement (PRO-STAT  SUGAR FREE 64)  30 mL Per Tube BID  . free water  20 mL Per Tube Q4H  . insulin aspart  0-9 Units Subcutaneous Q4H  . mouth rinse  15 mL Mouth Rinse q12n4p  . midodrine  5 mg Per Tube TID WC  . multivitamin  15 mL Per Tube QHS  . mupirocin ointment  1 application Nasal BID  .  nutrition supplement (JUVEN)  1 packet Per Tube BID BM   sodium chloride, acetaminophen (TYLENOL) oral liquid 160 mg/5 mL **OR** acetaminophen **OR** acetaminophen, albumin human, albuterol, fentaNYL (SUBLIMAZE) injection, fentaNYL (SUBLIMAZE) injection, ipratropium, lip balm, Melatonin, metoprolol tartrate, nitroGLYCERIN, ondansetron, polyethylene glycol, promethazine  Assessment/ Plan:  [name] is a 85 y.o. black female with diabetes mellitus type II, hypertension, hyperlipidemia, coronary disease status post CABG, history of stroke, CKD, was admitted on1/27/2020. [facility] 50 pound weight loss, dysarthria since November 2019. Now transitioned to [facility] on 02/26/18 to initiate hemodialysis. First hemodialysis treatment on 1/28 through [facility].  PD catheter placed on 03/29/18  1. End Stage Renal Disease with generalized edema   No signs of renal recovery. Anuric Hemodialysis last on Friday.  The tentative plan is for the patient to continue hemodialysis for the next several weeks and then transition the patient to peritoneal dialysis once PD catheter is matured.   Outpatient dialysis center [facility] albumin given on HD treatment  2. Hypotension: -  midodrine.  3. Anemia of chronic kidney disease:   - EPO with HD treatment  4. Malnutrition with achalasia: albumin 1.8 -Continue tube feeds  - IV albumin ordered with hemodialysis treatment.    LOS: [place] 3/2/20201:45 PM

## 2018-04-02 NOTE — Progress Notes (Addendum)
Nurse spoke with patient's son- as of this point [place] has been in contact with him- they had to go to [facility] to get supply and it has not been delivered at this time. He will call as soon as it is delivered.He mentioned that if she didn't leave by midnight she would be charged another night and I said I could check into that. He said no it's ok, he will just call when the company comes with the oxygen, there is no control on when that will happen and "it will be ok". Spoke with Patient's daughter about meds- she had picked up midodrine at pt's pharmacy and insulin paper presciption will be sent with pt to her home on discharge. Patient given nighttime meds and continuous feeding.

## 2018-04-02 NOTE — Discharge Instructions (Signed)
Gastrostomy Tube Home Guide, Adult A gastrostomy tube, or G-tube, is a tube that is inserted through the abdomen into the stomach. The tube is used to give feedings and medicines when a person is unable to eat and drink enough on his or her own. How to care for a G-tube Supplies needed  Saline solution or clean, warm water and soap.  Cotton swab or gauze.  Precut gauze bandage (dressing) and tape, if needed. Instructions 1. Wash your hands with soap and water. 2. If there is a dressing between the person's skin and the tube, remove it. 3. Check the area where the tube enters the skin. Check for problems such as: ? Redness. ? Swelling. ? Pus-like drainage. ? Extra skin growth. 4. Moisten the cotton swab with the saline solution or soap and water mixture. Gently clean around the insertion site. Remove any drainage or crusting. ? When the G-tube is first put in, a normal saline solution or water can be used to clean the skin. ? Mild soap and warm water can be used when the skin around the G-tube site has healed. 5. If there should be a dressing between the person's skin and the tube, apply it at this time. How to flush a G-tube Flush the G-tube regularly to keep it from clogging. Flush it before and after feedings and as often as told by the health care provider. Supplies needed  Purified or sterile water, warmed. If the person has a weak disease-fighting (immune) system, or if he or she has difficulty fighting off infections (is immunocompromised), use only sterile water. ? If you are unsure about the amount of chemical contaminants in purified or drinking water, use sterile water. ? To purify drinking water by boiling:  Boil water for at least 1 minute. Keep lid over water while it boils. Allow water to cool to room temperature before using.  60cc G-tube syringe. Instructions 1. Wash your hands with soap and water. 2. Draw up 30 mL of warm water in a syringe. 3. Connect the  syringe to the tube. 4. Slowly and gently push the water into the tube. G-tube problems and solutions  If the tube comes out: ? Cover the opening with a clean dressing and tape. ? Call a health care provider right away. ? A health care provider will need to put the tube back in within 4 hours.  If there is skin or scar tissue growing where the tube enters the skin: ? Keep the area clean and dry. ? Secure the tube with tape so that the tube does not move around too much. ? Call a health care provider.  If the tube gets clogged: ? Slowly push warm water into the tube with a large syringe. ? Do not force the fluid into the tube or push an object into the tube. ? If you are not able to unclog the tube, call a health care provider right away. Follow these instructions at home: Feedings  Give feedings at room temperature.  Cover and place unused feedings in the refrigerator.  If feedings are continuous: ? Do not put more than 4 hours worth of feedings in the feeding bag. ? Stop the feedings when you need to give medicine or flush the tube. Be sure to restart the feedings. ? Make sure the person's head is above his or her stomach (upright position). This will prevent choking and discomfort.  Replace feeding bags and syringes as told by the health care provider.  Make  sure the person is in the right position during and after feedings: ? During feedings, the person's position should be in the upright position. ? After a noncontinuous feeding (bolus feeding), have the person stay in the upright position for 1 hour. General instructions  Only use syringes made for G-tubes.  Do not pull or put tension on the tube.  Clamp the tube before removing the cap or disconnecting a syringe.  Measure the length of the G-tube every day from the insertion site to the end of the tube.  If the person's G-tube has a balloon, check the fluid in the balloon every week. The amount of fluid that should  be in the balloon can be found in the manufacturers specifications.  Make sure the person takes care of his or her oral health, such as by brushing his or her teeth.  Remove excess air from the G-tube as told by the person's health care provider. This is called "venting."  Keep the area where the tube enters the skin clean and dry.  Do not push feedings, medicines, or flushes rapidly. Contact a health care provider if:  The person with the tube has any of these problems: ? Constipation. ? Fever.  There is a large amount of fluid or mucus-like liquid leaking from the tube.  Skin or scar tissue appears to be growing where the tube enters the skin.  The length of tube from the insertion site to the G-tube gets longer. Get help right away if:  The person with the tube has any of these problems: ? Severe abdominal pain. ? Severe tenderness. ? Severe bloating. ? Nausea. ? Vomiting. ? Trouble breathing. ? Shortness of breath.  Any of these problems happen in the area where the tube enters the skin: ? Redness, irritation, swelling, or soreness. ? Pus-like discharge. ? A bad smell.  The tube is clogged and cannot be flushed.  The tube comes out. Summary  A gastrostomy tube, or G-tube, is a tube that is inserted through the abdomen into the stomach. The tube is used to give feedings and medicines when a person is unable to eat and drink enough on his or her own.  Check and clean the insertion site daily as told by the person's health care provider.  Flush the G-tube regularly to keep it from clogging. Flush it before and after feedings and as often as told by the person's health care provider.  Keep the area where the tube enters the skin clean and dry. This information is not intended to replace advice given to you by your health care provider. Make sure you discuss any questions you have with your health care provider. Document Released: 03/28/2001 Document Revised:  08/02/2016 Document Reviewed: 03/14/2016 Elsevier Interactive Patient Education  2019 [employer].   How to Care for a Feeding Tube  A feeding tube is a tube used to give medicine, water, and liquid food. A person may have this tube if she or he has trouble swallowing or cannot take food or medicine. Supplies needed to care for the tube site:  Clean gloves.  Clean washcloth, gauze pads, or soft paper towel.  Cotton swabs.  A skin barrier ointment or cream, such as petroleum jelly.  Soap and water.  Pre-cut foam pads or gauze (for around the tube).  Tube tape.  An anchoring device (optional). How to care for the tube site  1. Have all supplies ready and close to you. 2. Wash your hands. 3. Put  on gloves. 4. Change any pad or gauze near the tube if: ? It is dirty. ? It is wet. ? It has been there for more than one day. 5. Check the skin around the tube. Call the doctor if you see any of these: ? Red skin. ? A rash. ? Swelling. ? Leaking fluid. ? Extra skin. 6. Dip the gauze and cotton swabs in water and soap. 7. Use the cotton swabs to wipe the skin that is closest to the tube. 8. Use the gauze pads to wipe the rest of the skin near the tube. 9. Rinse with water. 10. Dry the area with a clean washcloth, dry gauze pad, or soft paper towel. 11. If the skin is red, use a cotton swab to put on a skin barrier cream or ointment. Put it on by making little circles. Do not apply antibiotic ointments at the tube site. 12. Put a new pre-cut foam pad or gauze around the tube. If there is no fluid at the tube site, you do not need a pad or gauze. 13. Tape down the edges. 14. Use tape or an anchoring device to attach the tube to the skin. Do this for comfort or as told. Each time you use tape, put it in a different place. 15. Sit the person up. 16. Throw away used supplies. 17. Take off your gloves. 18. Wash your hands. Supplies needed to flush a feeding tube:  Clean  gloves.  A clean 60 mL syringe that connects to the feeding tube.  A towel.  Germ-free (sterile) or purified water. Follow these rules: ? Use germ-free water if:  Your body's defense system (immune system) is weak and you have trouble getting better from infections (are immunocompromised).  You do not know how many chemicals are in your water. ? Do not use water from lakes or other bodies of water unless you treat it or filter it first. ? To make drinking water pure by boiling:  Boil water for 1 minute or longer. Keep a lid over the water while it boils.  Let the water cool off to room temperature before you use it. How to flush a feeding tube 1. Have all supplies ready and close to you. 2. Wash your hands. 3. Put on gloves. 4. Pull 30 mL of water into the syringe. 5. Before you push water into (flush) the tube, put the towel under the tube to catch any fluid leaks. 6. Bend (kink) the feeding tube while you do one of these things: ? Disconnect it from the feeding-bag tubing. ? Take off the cap at the end of the tube. 7. Put the tip of the syringe into the end of the feeding tube. 8. Stop bending the tube. 9. Use the syringe to slowly put the water in the tube. If the water will not go in the tube: ? Have the person lie on his or her left side. ? Try putting the water in the tube again. ? Do not push hard to make the water go in. 10. Take out the syringe and put the cap on the tube. 11. Throw away used supplies. 12. Take off your gloves. 13. Wash your hands. Follow these instructions at home: Caring for the tube  If the person has a foam pad or gauze near the tube, change it: ? Every day. ? When it is dirty. ? When it is wet.  Do not put antibiotic ointments by the tube. Flushing the tube  Do  not use a syringe that is smaller than 60 mL.  Flush the tube at all of these times: ? Before you give medicine. ? Between medicines. ? After the person gets the last medicine  before a feeding.  Do not mix medicines with formula. Do not mix medicines with other medicines.  Completely flush medicines through the tube. That way, they will not mix with formula. Contact a doctor if:  The tube gets blocked or clogged.  You find any of these on the skin around the tube site: ? Red skin. ? A rash. ? Swelling. ? Leaking fluid. ? Extra skin. Summary  A feeding tube is a tube used to give medicine, water, and liquid food. A person may have this tube if she or he has trouble swallowing or cannot take food or medicine.  Follow the doctor's instructions to care for the tube site and flush the tube every day.  Contact a doctor if the tube gets blocked or clogged. This information is not intended to replace advice given to you by your health care provider. Make sure you discuss any questions you have with your health care provider. Document Released: 10/12/2011 Document Revised: 02/26/2016 Document Reviewed: 02/26/2016 Elsevier Interactive Patient Education  2019 [employer]   Indwelling Pleural Catheter Home Guide  An indwelling pleural catheter is a thin, flexible tube that is inserted under your skin and into your chest. The catheter drains excess fluid that collects in the area between the chest wall and the lungs (pleural space).After the catheter is inserted, it can be attached to a bottle that collects fluid. The pleural catheter will allow you to drain fluid from your chest at home on a regular basis (sometimes daily). This will eliminate the need for frequent visits to the hospital or clinic to drain the fluid. The catheter may be removed after the excess fluid problem is resolved, usually after 2-3 months. It is important to follow instructions from your health care provider about how to drain and care for your catheter. What are the risks? Generally, this is a safe procedure. However, problems may occur, including:  Infection.  Skin damage around the  catheter.  Lung damage.  Failure of the chest tube to work properly.  Spreading of cancer cells along the catheter, if you have cancer. Supplies needed:  Vacuum-sealed drainage bottle with attached drainage line.  Sterile dressing.  Sterile alcohol pads.  Sterile gloves.  Valve cap.  Sterile gauze pads, 4  4 inch (10 cm  10 cm).  Tape.  Adhesive dressing.  Sterile foam catheter pad. How to care for your catheter and insertion site  Wash your hands with soap and warm water before and after touching the catheter or insertion site. If soap and water are not available, use hand sanitizer.  Check your bandage (dressing) daily to make sure it is clean and dry.  Keep the skin around the catheter clean and dry.  Check the catheter regularly for any cracks or kinks in the tubing.  Check your catheter insertion site every day for signs of infection. Check for: ? Skin breakdown. ? Redness, swelling, or pain. ? Fluid or blood. ? Warmth. ? Pus or a bad smell. How to drain your catheter You may need to drain your catheter every day, or more or less often as told by your health care provider. Follow instructions from your health care provider about how to drain your catheter. You may also refer to instructions that come with the drainage  system. To drain the catheter: 1. Wash your hands with soap and warm water. If soap and water are not available, use hand sanitizer. 2. Carefully remove the dressing from around the catheter. 3. Wash your hands again. 4. Put on the gloves provided. 5. Prepare the vacuum-sealed drainage bottle and drainage line. Close the drainage line of the vacuum-sealed drainage bottle by squeezing the pinch clamp or rolling the wheel of the roller clamp toward the bottle. The vacuum in the bottle will be lost if the line is not closed completely. 6. Remove the access tip cover from the drainage line. Do not touch the end. Set it on a sterile surface. 7. Remove  the catheter valve cap and throw it away. 8. Use an alcohol pad to clean the end of the catheter. 9. Insert the access tip into the catheter valve. Make sure the valve and access tip are securely connected. Listen for a click to confirm that they are connected. 10. Insert the T plunger to break the vacuum seal on the drainage bottle. 11. Open the clamp on the drainage line. 12. Allow the catheter to drain. Keep the catheter and the drainage bottle below the level of your chest. There may be a one-way valve on the end of the tubing that will allow liquid and air to flow out of the catheter without letting air inside. 13. Drain the amount of fluid as told by your health care provider. It usually takes 5-15 minutes. Do not drain more than 1000 mL of fluid. You may feel a little discomfort while you are draining. If the pain is severe, stop draining and contact your health care provider. 14. After you finish draining the catheter, remove the drainage bottle tubing from the catheter. 15. Use a clean alcohol pad to wipe the catheter tip. 16. Place a clean cap on the end of the catheter. 17. Use an alcohol pad to clean the skin around the catheter. 18. Allow the skin to air-dry. 19. Put the catheter pad on your skin. Curl the catheter into loops and place it on the pad. Do not place the catheter on your skin. 20. Replace the dressing over the catheter. 21. Discard the drainage bottle as instructed by your health care provider. Do not reuse the drainage bottle. How to change your dressing Change your dressing at least once a week, or more often if needed to keep the dressing dry. Be sure to change the dressing whenever it becomes moist. Your health care provider will tell you how often to change your dressing. 1. Wash your hands with soap and warm water. If soap and water are not available, use hand sanitizer. 2. Gently remove the old dressing. Avoid using scissors to remove the dressing. Sharp objects may  damage the catheter. 3. Wash the skin around the insertion site with mild, fragrance-free soap and warm water. Rinse well, then pat the area dry with a clean cloth. 4. Check the skin around the catheter for signs of infection. Check for: ? Skin breakdown. ? Redness, swelling, or pain. ? Fluid or blood. ? Warmth. ? Pus or a bad smell. 5. If your catheter was stitched (sutured) to your skin, look at the suture to make sure it is still anchored in your skin. 6. Do not apply creams, ointments, or alcohol to the area. Let your skin air-dry completely before you apply a new dressing. 7. Curl the catheter into loops and place it on the sterile catheter pad. Do not place the  catheter on your skin. 8. If you do not have a pad, use a clean dressing. Slide the dressing under the disk that holds the drainage catheter in place. 9. Use gauze to cover the catheter and the catheter pad. The catheter should rest on the pad or dressing, not on your skin. 10. Tape the dressing to your skin. You may be instructed to use an adhesive dressing covering instead of gauze and tape. 11. Wash your hands with soap and warm water. If soap and water are not available, use hand sanitizer. General recommendations  Always wash your hands with soap and warm water before and after caring for your catheter and drainage bottle. Use a mild, fragrance-free soap. If soap and water are not available, use hand sanitizer.  Always make sure there are no leaks in the catheter or drainage bottle.  Each time you drain the catheter, note the color and amount of fluid.  Do not touch the tip of the catheter or the drainage bottle tubing.  Do not reuse drainage bottles.  Do not take baths, swim, or use a hot tub until your health care provider approves. Ask your health care provider if you may take showers. You may only be allowed to take sponge baths.  Take deep breaths regularly, followed by a cough. Doing this can help to prevent lung  infection. Contact a health care provider if:  You have any questions about caring for your catheter or drainage bottle.  You still have pain at the catheter insertion site more than 2 days after your procedure.  You have pain while draining your catheter.  Your catheter becomes bent, twisted, or cracked.  The connection between the catheter and the collection bottle becomes loose.  You have any of these around your catheter insertion site or coming from it: ? Skin breakdown. ? Redness, swelling, or pain. ? Fluid or blood. ? Warmth. ? Pus or a bad smell. Get help right away if:  You have a fever or chills.  You have chest pain.  You have dizziness or shortness of breath.  You have severe redness, swelling, or pain at your catheter insertion site.  The catheter comes out.  The catheter is blocked or clogged. Summary  An indwelling pleural catheter is a thin, flexible tube that is inserted under your skin and into your chest. The catheter drains excess fluid that collects in the area between the chest wall and the lungs (pleural space).  It is important to follow instructions from your health care provider about how to drain and care for your catheter.  Do not touch the tip of the catheter or the drainage bottle tubing.  Always wash your hands with soap and water before and after caring for your catheter and drainage bottle. If soap and water are not available, use hand sanitizer. This information is not intended to replace advice given to you by your health care provider. Make sure you discuss any questions you have with your health care provider. Document Released: 05/12/2016 Document Revised: 05/12/2016 Document Reviewed: 05/12/2016 Elsevier Interactive Patient Education  2019 [place] may shower as of Sunday.  Dermabond to incisions to peel off over next few weeks. Change catheter dressing daily. Take care not to place tape / adhesive on catheter directly as  this may cause injury.

## 2018-04-02 NOTE — Progress Notes (Signed)
Nutrition Follow Up Note   DOCUMENTATION CODES:   Not applicable  INTERVENTION:   Continue Osmolite 1.5 @ goal rate of 28m/hr   Prostat 318mBID via tube   Free water flushes 2059m 4hrs  Regimen provides 1640kcal/day, 90g/day protein, 852m79mee water  Liquid MVI daily via tube  B-complex with C daily via tube  Juven Fruit Punch BID, each serving provides 95kcal and 2.5g of protein (amino acids glutamine and arginine)  NUTRITION DIAGNOSIS:   Inadequate oral intake related to dysphagia as evidenced by NPO status.  GOAL:   Patient will meet greater than or equal to 90% of their needs  -met with tube feeds  MONITOR:   Labs, Weight trends, TF tolerance, Skin, I & O's  ASSESSMENT:   89 y33. 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 [facility]    Pt s/p PD cath 2/27  Pt tolerating tube feeds; continue at goal. Weight down ~20lbs since admit. Pt to discharge today.   Medications reviewed and include: B complex with C, plavix, epoetin, insulin, pepcid, MVI, protonix  Labs reviewed: K 4.8 wnl- 3/1 P 3.1 wnl- 2/28 cbgs- 250, 304, 282 x 24 hrs  Diet Order:   Diet Order            Diet - low sodium heart healthy             EDUCATION NEEDS:   Not appropriate for education at this time  Skin:  Skin Assessment: Reviewed RN Assessment(unstageable wound buttocks, MASD )  Last BM:  3/1- type 7  Height:   Ht Readings from Last 1 Encounters:  03/29/18 _0  (1.6 m)    Weight:   Wt Readings from Last 1 Encounters:  04/02/18 77.5 kg    Ideal Body Weight:  52.3 kg  BMI:  Body mass index is 30.27 kg/m.  Estimated Nutritional Needs:   Kcal:   1400-1600kcal/day   Protein:  78-89g/day   Fluid:  UOP +1L  CaseKoleen Distance RD, LDN Pager #- [phone]#- [phone] Hours Pager: [phone]

## 2018-04-02 NOTE — Progress Notes (Signed)
SATURATION QUALIFICATIONS: (This note is used to comply with regulatory documentation for home oxygen)  Patient Saturations on Room Air at Rest = 75%  Patient Saturations on Room Air while Ambulating = %  Patient Saturations on Liters of oxygen while Ambulating =%  Please briefly explain why patient needs home oxygen: recovery sat of 98% on 4L

## 2018-04-02 NOTE — Progress Notes (Signed)
At this time equipment has not been delivered to patient home yet. Night RN is trying to figure out the situation .

## 2018-04-02 NOTE — Care Management Note (Signed)
Case Management Note  Patient Details  Name: Jennene Downie MRN: [id number] Date of Birth: 06-05-1927   Patient to discharge home via EMS.  Packet on chart.   Cassie with [facility] notified of discharge.  Orders for tube feedings and o2 provided with Brad with [facility].  To be delivered to the patient's home.  Son was notified to call the floor after equipment has been delivered, that way the bedside RN can call for EMS transportation.  RNCM confirmed that patient can start outpatient HD on Wednesday.  Son confirms he is aware, and will be setting up his own private transportation.   Son again confirms all appropriate medical equipment has been delivered, pending the feeds and O2. Son confirms all requested home services have been arranged at discharge.   RNCM signing off.    Subjective/Objective:                    Action/Plan:   Expected Discharge Date:  04/02/18               Expected Discharge Plan:  [place]  In-House Referral:     Discharge planning Services  CM Consult  Post Acute Care Choice:  Durable Medical Equipment, Home Health Choice offered to:     DME Arranged:  Other see comment(reclining WC, tube feeds) DME Agency:  [facility] Arranged:  RN, PT, OT, Nurse's Aide, Speech Therapy HH Agency:  [facility]  Status of Service:  Completed, signed off  If discussed at [place] of Stay Meetings, dates discussed:    Additional Comments:  Beverly Sessions, RN 04/02/2018, 4:34 PM

## 2018-04-02 NOTE — Progress Notes (Signed)
Pre hemodialysis report received from L. Programmer, applications. Received patient in acute room in dialysis chair. Awake, alert and  responsive. No acute distress noted. Right permacath without signs and symptoms of infection.  Dressing in place and uncompromised. Accessed permacath per policy and found patent on each side. Hemodialysis treatment initiated at 1041 via permacath. Plan for 3 hours treatment with UF of 500 mL as tolerated.    04/02/18 1041  Hand-Off documentation  Report received from (Full Name) L. Lenard Simmer, RN  Vital Signs  Temp 98.8 F (37.1 C)  Temp Source Oral  Pulse Rate (!) 39  Resp 15  BP (!) 96/52  BP Location Right Arm  BP Method Automatic  Patient Position (if appropriate) Sitting  Oxygen Therapy  SpO2 98 %  O2 Device Room Air  Pain Assessment  Pain Scale 0-10  Pain Score 0  Machine Checks  Machine Number 4  Station Number 4  UF/Alarm Test Passed  Conductivity: Meter 14  Conductivity: Machine  14  pH 7.4  Reverse Osmosis central (central)  Normal Saline Lot Number [id number]  Dialyzer Lot Number [id number]  Disposable Set Lot Number [id number]  Machine Temperature 98.6 F (37 C)  Musician and Audible Yes  Blood Lines Intact and Secured Yes  Pre Treatment Patient Checks  Vascular access used during treatment Catheter  Patient is receiving dialysis in a chair Yes  Hepatitis B Surface Antigen Results Negative  Date Hepatitis B Surface Antigen Drawn 03/28/18  Isolation Initiated Yes  Hepatitis B Surface Antibody  (susceptible)  Date Hepatitis B Surface Antibody Drawn 02/26/18  Hemodialysis Consent Verified Yes  Hemodialysis Standing Orders Initiated Yes  ECG (Telemetry) Monitor On Yes  Prime Ordered Normal Saline  Length of  DialysisTreatment -hour(s) 250 Hour(s)  Dialyzer Elisio 17H NR  Dialysate 2K, 2.5 Ca  Dialysate Flow Ordered 600  Blood Flow Rate Ordered 300 mL/min  Ultrafiltration Goal 0.5 Liters  Dialysis Blood Pressure Support Ordered Albumin  During  Hemodialysis Assessment  Blood Flow Rate (mL/min) 300 mL/min  Arterial Pressure (mmHg) -120 mmHg  Venous Pressure (mmHg) 90 mmHg  Transmembrane Pressure (mmHg) 40 mmHg  Ultrafiltration Rate (mL/min) 330 mL/min  Dialysate Flow Rate (mL/min) 600 ml/min  Conductivity: Machine  14  HD Safety Checks Performed Yes

## 2018-04-03 LAB — GLUCOSE, CAPILLARY
Glucose-Capillary: 258 mg/dL — ABNORMAL HIGH (ref 70–99)
Glucose-Capillary: 186 mg/dL — ABNORMAL HIGH (ref 70–99)
Glucose-Capillary: 214 mg/dL — ABNORMAL HIGH (ref 70–99)

## 2018-04-03 NOTE — Progress Notes (Signed)
[facility]  ROUNDING NOTE   Subjective:   Hemodialysis treatment yesterday. Seated in chair  Discharge today after oxygen at home has been confirmed.    Objective:  Vital signs in last 24 hours:  Temp:  [97.5 F (36.4 C)-98.2 F (36.8 C)] 97.7 F (36.5 C) (03/03 1126) Pulse Rate:  [53-103] 101 (03/03 1126) Resp:  [14-28] 18 (03/03 0422) BP: (77-143)/(39-85) 131/64 (03/03 1126) SpO2:  [75 %-100 %] 95 % (03/03 1126)  Weight change:  Filed Weights   03/31/18 0500 04/01/18 0451 04/02/18 0500  Weight: 76.1 kg 76.7 kg 77.5 kg    Intake/Output: I/O last 3 completed shifts: In: 1249.3 [NG/GT:1249.3] Out: 888 [Other:308; Stool:580]   Intake/Output this shift:  No intake/output data recorded.  Physical Exam: General:  No acute distress, laying in the bed  Head:  Temporal muscle wasting  Eyes:  Anicteric  Neck:  Supple  Lungs:   clear  Heart:  Irregular rhythm  Abdomen:   Soft, nontender, +G-tube  Extremities:  +++ anasarca  Neurologic:  Awake, will follow simple commands  Access:  RIJ permcath, PD catheter    Basic Metabolic Panel: Recent Labs  Lab 03/29/18 1108 03/29/18 1449 03/30/18 0715 03/30/18 1515 04/01/18 1339  NA 137 138 136  --  135  K 4.0 3.9 4.2  --  4.8  CL 100  --  99  --  99  CO2 28  --  31  --  25  GLUCOSE 152* 152* 143*  --  245*  BUN 58*  --  70*  --  72*  CREATININE 1.73*  --  2.12*  --  2.07*  CALCIUM 8.6*  --  8.9  --  9.0  MG 1.9  --   --   --   --   PHOS  --   --   --  3.1  --     Liver Function Tests: Recent Labs  Lab 04/01/18 1339  AST 58*  ALT 31  ALKPHOS 98  BILITOT 0.9  PROT 5.9*  ALBUMIN 1.8*   No results for input(s): LIPASE, AMYLASE in the last 168 hours. No results for input(s): AMMONIA in the last 168 hours.  CBC: Recent Labs  Lab 03/29/18 1108 03/29/18 1449 03/30/18 0715  WBC 5.5  --  5.6  HGB 8.4* 10.5* 8.6*  HCT 29.6* 31.0* 29.7*  MCV 108.0*  --  106.1*  PLT 142*  --  149*    Cardiac  Enzymes: No results for input(s): CKTOTAL, CKMB, CKMBINDEX, TROPONINI in the last 168 hours.  BNP: Invalid input(s): POCBNP  CBG: Recent Labs  Lab 04/02/18 1744 04/02/18 1940 04/03/18 0124 04/03/18 0419 04/03/18 0751  GLUCAP 173* 209* 258* 186* 214*    Microbiology: Results for orders placed or performed during the hospital encounter of 02/26/18  MRSA PCR Screening     Status: Abnormal   Collection Time: 02/26/18  5:39 PM  Result Value Ref Range Status   MRSA by PCR POSITIVE (A) NEGATIVE Final    Comment:        The GeneXpert MRSA Assay (FDA approved for NASAL specimens only), is one component of a comprehensive MRSA colonization surveillance program. It is not intended to diagnose MRSA infection nor to guide or monitor treatment for MRSA infections. CRITICAL RESULT CALLED TO, READ BACK BY AND VERIFIED WITH:  CALLED TO DANA DUGGINS RN @1920  Performed at [facility], [address] [place], [place] [address]   CULTURE, BLOOD (ROUTINE X 2)  w Reflex to ID Panel     Status: None   Collection Time: 03/06/18  9:01 PM  Result Value Ref Range Status   Specimen Description BLOOD LEFT ANTECUBITAL  Final   Special Requests   Final    BOTTLES DRAWN AEROBIC ONLY Blood Culture adequate volume   Culture   Final    NO GROWTH 5 DAYS Performed at [facility], [address] [place], [place] [address]    Report Status 03/11/2018 FINAL  Final  CULTURE, BLOOD (ROUTINE X 2) w Reflex to ID Panel     Status: None   Collection Time: 03/06/18  9:37 PM  Result Value Ref Range Status   Specimen Description BLOOD LEFT ANTECUBITAL  Final   Special Requests   Final    BOTTLES DRAWN AEROBIC AND ANAEROBIC Blood Culture adequate volume   Culture   Final    NO GROWTH 5 DAYS Performed at [facility], [address] [place],  [address]    Report Status 03/11/2018 FINAL  Final  MRSA PCR Screening     Status: Abnormal   Collection Time: 03/29/18  8:56  PM  Result Value Ref Range Status   MRSA by PCR POSITIVE (A) NEGATIVE Final    Comment:        The GeneXpert MRSA Assay (FDA approved for NASAL specimens only), is one component of a comprehensive MRSA colonization surveillance program. It is not intended to diagnose MRSA infection nor to guide or monitor treatment for MRSA infections. RESULT CALLED TO, READ BACK BY AND VERIFIED WITH: HIRAL PATEL 03/29/18 @ 2215   [place] Performed at [facility], [address] [place],  [address]     Coagulation Studies: No results for input(s): LABPROT, INR in the last 72 hours.  Urinalysis: No results for input(s): COLORURINE, LABSPEC, PHURINE, GLUCOSEU, HGBUR, BILIRUBINUR, KETONESUR, PROTEINUR, UROBILINOGEN, NITRITE, LEUKOCYTESUR in the last 72 hours.  Invalid input(s): APPERANCEUR    Imaging: No results found.   Medications:   . sodium chloride 0 mL/hr at 03/26/18 0017  . sodium chloride Stopped (03/29/18 1700)  . albumin human 25 g (04/02/18 1112)  . feeding supplement (OSMOLITE 1.5 CAL) 1,000 mL (04/02/18 1610)  . phenylephrine (NEO-SYNEPHRINE) Adult infusion Stopped (03/29/18 2050)   . atorvastatin  40 mg Per Tube q1800  . B-complex with vitamin C  1 tablet Per Tube Daily  . chlorhexidine  15 mL Mouth Rinse BID  . clopidogrel  75 mg Per Tube QHS  . collagenase   Topical Daily  . dextrose  1 ampule Intravenous Once  . epoetin (EPOGEN/PROCRIT) injection  10,000 Units Intravenous Q M,W,F-HD  . famotidine  10 mg Per Tube Daily  . feeding supplement (PRO-STAT SUGAR FREE 64)  30 mL Per Tube BID  . free water  20 mL Per Tube Q4H  . insulin aspart  0-9 Units Subcutaneous Q4H  . mouth rinse  15 mL Mouth Rinse q12n4p  . midodrine  5 mg Per Tube TID WC  . multivitamin  15 mL Per Tube QHS  . mupirocin ointment  1 application Nasal BID  . nutrition supplement (JUVEN)  1 packet Per Tube BID BM   sodium chloride, acetaminophen (TYLENOL) oral liquid 160 mg/5 mL **OR**  acetaminophen **OR** acetaminophen, albumin human, albuterol, fentaNYL (SUBLIMAZE) injection, fentaNYL (SUBLIMAZE) injection, ipratropium, lip balm, Melatonin, metoprolol tartrate, nitroGLYCERIN, ondansetron, polyethylene glycol, promethazine  Assessment/ Plan:  [name] is a 85 y.o. black female with diabetes mellitus type II,  hypertension, hyperlipidemia, coronary disease status post CABG, history of stroke, CKD, was admitted on1/27/2020. [facility] to [facility]and 50 pound weight loss, dysarthria since November 2019. Now transitioned to [facility] on 02/26/18 to initiate hemodialysis. First hemodialysis treatment on 1/28 through [facility].  PD catheter placed on 03/29/18  1. End Stage Renal Disease with generalized edema   No signs of renal recovery. Anuric Hemodialysis last on Friday.  The tentative plan is for the patient to continue hemodialysis for the next several weeks and then transition the patient to peritoneal dialysis once PD catheter is matured.   Outpatient dialysis center [place]   2. Hypotension: -  midodrine.  3. Anemia of chronic kidney disease:   - EPO with HD treatment  4. Malnutrition with achalasia:   -Continue tube feeds    LOS: 36 [name] 3/3/202012:24 PM

## 2018-04-03 NOTE — Progress Notes (Signed)
Patient discharge teaching given to EMS to be given to pt.'s son, including activity, diet, follow-up appoints, and medications. [name] verbalized understanding of all discharge instructions. IV access was d/c'd. Vitals are stable. Skin is intact except as charted in most recent assessments. Pt to be escorted out by EMS, to be driven home. RN contacted pt.'s son that EMS came and picked up pt. He verbalized that he will be at home waiting for her.   Maclain Cohron [employer]

## 2018-04-03 NOTE — Progress Notes (Signed)
Nurse had not received telephone call from patient's son concerning if oxygen had arrived to patient's house. Nurse telephoned patient's son twice to his cell and once to the residence. Nurse was  unable to leave message d/t not voicemail being set up on either number. Spoke with hospitalist who ordered discharge on hold until dayshift.

## 2018-04-03 NOTE — Care Management Note (Signed)
Case Management Note  Patient Details  Name: [name] MRN: [id number] Date of Birth: 17-Jun-1927   [name] with [facility] notified that discharge was held until today.   Elvera Bicker dialysis liaison notified of discharge    Subjective/Objective:                    Action/Plan:   Expected Discharge Date:  04/03/18               Expected Discharge Plan:  [place]  In-House Referral:     Discharge planning Services  CM Consult  Post Acute Care Choice:  Durable Medical Equipment, Home Health Choice offered to:     DME Arranged:  Other see comment(reclining WC, tube feeds) DME Agency:  [place] Arranged:  RN, PT, OT, Nurse's Aide, Speech Therapy HH Agency:  [facility]  Status of Service:  Completed, signed off  If discussed at [place] of Stay Meetings, dates discussed:    Additional Comments:  Beverly Sessions, RN 04/03/2018, 10:19 AM

## 2018-04-06 ENCOUNTER — Emergency Department: Payer: Medicare Other

## 2018-04-06 ENCOUNTER — Observation Stay: Payer: Medicare Other

## 2018-04-06 ENCOUNTER — Observation Stay
Admission: EM | Admit: 2018-04-06 | Discharge: 2018-04-08 | Disposition: A | Discharge status: Home / Self Care | Payer: Medicare Other | Attending: Specialist | Admitting: Specialist

## 2018-04-06 ENCOUNTER — Other Ambulatory Visit: Payer: Self-pay

## 2018-04-06 ENCOUNTER — Encounter: Payer: Self-pay | Admitting: Emergency Medicine

## 2018-04-06 DIAGNOSIS — I429 Cardiomyopathy, unspecified: Secondary | ICD-10-CM | POA: Diagnosis not present

## 2018-04-06 DIAGNOSIS — R4182 Altered mental status, unspecified: Secondary | ICD-10-CM | POA: Diagnosis not present

## 2018-04-06 DIAGNOSIS — E1122 Type 2 diabetes mellitus with diabetic chronic kidney disease: Secondary | ICD-10-CM | POA: Insufficient documentation

## 2018-04-06 DIAGNOSIS — Z79899 Other long term (current) drug therapy: Secondary | ICD-10-CM | POA: Insufficient documentation

## 2018-04-06 DIAGNOSIS — I480 Paroxysmal atrial fibrillation: Secondary | ICD-10-CM | POA: Insufficient documentation

## 2018-04-06 DIAGNOSIS — R627 Adult failure to thrive: Secondary | ICD-10-CM | POA: Insufficient documentation

## 2018-04-06 DIAGNOSIS — D631 Anemia in chronic kidney disease: Secondary | ICD-10-CM | POA: Insufficient documentation

## 2018-04-06 DIAGNOSIS — R34 Anuria and oliguria: Secondary | ICD-10-CM | POA: Insufficient documentation

## 2018-04-06 DIAGNOSIS — Z951 Presence of aortocoronary bypass graft: Secondary | ICD-10-CM | POA: Diagnosis not present

## 2018-04-06 DIAGNOSIS — G9340 Encephalopathy, unspecified: Secondary | ICD-10-CM | POA: Diagnosis present

## 2018-04-06 DIAGNOSIS — E46 Unspecified protein-calorie malnutrition: Secondary | ICD-10-CM | POA: Insufficient documentation

## 2018-04-06 DIAGNOSIS — I5043 Acute on chronic combined systolic (congestive) and diastolic (congestive) heart failure: Secondary | ICD-10-CM | POA: Insufficient documentation

## 2018-04-06 DIAGNOSIS — J90 Pleural effusion, not elsewhere classified: Secondary | ICD-10-CM | POA: Insufficient documentation

## 2018-04-06 DIAGNOSIS — I2510 Atherosclerotic heart disease of native coronary artery without angina pectoris: Secondary | ICD-10-CM | POA: Insufficient documentation

## 2018-04-06 DIAGNOSIS — R4702 Dysphasia: Secondary | ICD-10-CM | POA: Diagnosis not present

## 2018-04-06 DIAGNOSIS — J9601 Acute respiratory failure with hypoxia: Secondary | ICD-10-CM | POA: Insufficient documentation

## 2018-04-06 DIAGNOSIS — F0390 Unspecified dementia without behavioral disturbance: Secondary | ICD-10-CM | POA: Diagnosis not present

## 2018-04-06 DIAGNOSIS — I959 Hypotension, unspecified: Secondary | ICD-10-CM | POA: Diagnosis not present

## 2018-04-06 DIAGNOSIS — I132 Hypertensive heart and chronic kidney disease with heart failure and with stage 5 chronic kidney disease, or end stage renal disease: Secondary | ICD-10-CM | POA: Diagnosis not present

## 2018-04-06 DIAGNOSIS — E785 Hyperlipidemia, unspecified: Secondary | ICD-10-CM | POA: Diagnosis not present

## 2018-04-06 DIAGNOSIS — Z992 Dependence on renal dialysis: Secondary | ICD-10-CM | POA: Diagnosis present

## 2018-04-06 DIAGNOSIS — I69354 Hemiplegia and hemiparesis following cerebral infarction affecting left non-dominant side: Secondary | ICD-10-CM | POA: Diagnosis not present

## 2018-04-06 DIAGNOSIS — K220 Achalasia of cardia: Secondary | ICD-10-CM | POA: Insufficient documentation

## 2018-04-06 DIAGNOSIS — N186 End stage renal disease: Secondary | ICD-10-CM | POA: Insufficient documentation

## 2018-04-06 DIAGNOSIS — Z931 Gastrostomy status: Secondary | ICD-10-CM | POA: Insufficient documentation

## 2018-04-06 DIAGNOSIS — R471 Dysarthria and anarthria: Secondary | ICD-10-CM | POA: Insufficient documentation

## 2018-04-06 DIAGNOSIS — Z794 Long term (current) use of insulin: Secondary | ICD-10-CM | POA: Insufficient documentation

## 2018-04-06 DIAGNOSIS — Z888 Allergy status to other drugs, medicaments and biological substances status: Secondary | ICD-10-CM | POA: Insufficient documentation

## 2018-04-06 DIAGNOSIS — Z7902 Long term (current) use of antithrombotics/antiplatelets: Secondary | ICD-10-CM | POA: Insufficient documentation

## 2018-04-06 DIAGNOSIS — Z955 Presence of coronary angioplasty implant and graft: Secondary | ICD-10-CM | POA: Insufficient documentation

## 2018-04-06 DIAGNOSIS — Z833 Family history of diabetes mellitus: Secondary | ICD-10-CM | POA: Insufficient documentation

## 2018-04-06 LAB — COMPREHENSIVE METABOLIC PANEL
ALT: 21 U/L (ref 0–44)
AST: 38 U/L (ref 15–41)
Albumin: 2.3 g/dL — ABNORMAL LOW (ref 3.5–5.0)
Alkaline Phosphatase: 100 U/L (ref 38–126)
Anion gap: 11 (ref 5–15)
BUN: 76 mg/dL — ABNORMAL HIGH (ref 8–23)
CHLORIDE: 99 mmol/L (ref 98–111)
CO2: 27 mmol/L (ref 22–32)
Calcium: 9.6 mg/dL (ref 8.9–10.3)
Creatinine, Ser: 2.5 mg/dL — ABNORMAL HIGH (ref 0.44–1.00)
GFR calc Af Amer: 19 mL/min — ABNORMAL LOW (ref >60)
GFR calc non Af Amer: 17 mL/min — ABNORMAL LOW (ref >60)
GLUCOSE: 346 mg/dL — AB (ref 70–99)
Potassium: 4.7 mmol/L (ref 3.5–5.1)
Sodium: 137 mmol/L (ref 135–145)
Total Bilirubin: 1.1 mg/dL (ref 0.3–1.2)
Total Protein: 6.6 g/dL (ref 6.5–8.1)

## 2018-04-06 LAB — CBC WITH DIFFERENTIAL/PLATELET
Abs Immature Granulocytes: 0.03 10*3/uL (ref 0.00–0.07)
Basophils Absolute: 0 10*3/uL (ref 0.0–0.1)
Basophils Relative: 0 %
EOS ABS: 0.2 10*3/uL (ref 0.0–0.5)
Eosinophils Relative: 2 %
HCT: 29.2 % — ABNORMAL LOW (ref 36.0–46.0)
Hemoglobin: 8.6 g/dL — ABNORMAL LOW (ref 12.0–15.0)
Immature Granulocytes: 0 %
Lymphocytes Relative: 11 %
Lymphs Abs: 0.9 10*3/uL (ref 0.7–4.0)
MCH: 29.8 pg (ref 26.0–34.0)
MCHC: 29.5 g/dL — ABNORMAL LOW (ref 30.0–36.0)
MCV: 101 fL — ABNORMAL HIGH (ref 80.0–100.0)
Monocytes Absolute: 0.9 10*3/uL (ref 0.1–1.0)
Monocytes Relative: 11 %
Neutro Abs: 6.5 10*3/uL (ref 1.7–7.7)
Neutrophils Relative %: 76 %
Platelets: 196 10*3/uL (ref 150–400)
RBC: 2.89 MIL/uL — ABNORMAL LOW (ref 3.87–5.11)
RDW: 17.6 % — ABNORMAL HIGH (ref 11.5–15.5)
Smear Review: ADEQUATE
WBC: 8.6 10*3/uL (ref 4.0–10.5)
nRBC: 0.4 % — ABNORMAL HIGH (ref 0.0–0.2)

## 2018-04-06 LAB — GLUCOSE, CAPILLARY: Glucose-Capillary: 285 mg/dL — ABNORMAL HIGH (ref 70–99)

## 2018-04-06 LAB — TROPONIN I: Troponin I: 0.14 ng/mL (ref <0.03)

## 2018-04-06 MED ORDER — FREE WATER
30.0000 mL | Freq: Three times a day (TID) | Status: DC
  Administered 2018-04-07 – 2018-04-08 (×5): 30 mL

## 2018-04-06 MED ORDER — CLOPIDOGREL BISULFATE 75 MG PO TABS
75.0000 mg | ORAL_TABLET | Freq: Every day | ORAL | Status: DC
  Administered 2018-04-07 – 2018-04-08 (×2): 75 mg
  Filled 2018-04-06 (×2): qty 1

## 2018-04-06 MED ORDER — B COMPLEX-C PO TABS
1.0000 | ORAL_TABLET | Freq: Every day | ORAL | Status: DC
  Administered 2018-04-07 – 2018-04-08 (×2): 1
  Filled 2018-04-06 (×2): qty 1

## 2018-04-06 MED ORDER — MELATONIN 3 MG PO TABS: 3.0000 mg | ORAL_TABLET | Freq: Every evening | ORAL | Status: DC | PRN

## 2018-04-06 MED ORDER — IPRATROPIUM BROMIDE 0.02 % IN SOLN: 0.5000 mg | Freq: Four times a day (QID) | RESPIRATORY_TRACT | Status: DC | PRN

## 2018-04-06 MED ORDER — MELATONIN 5 MG PO TABS
2.5000 mg | ORAL_TABLET | Freq: Every evening | ORAL | Status: DC | PRN
  Administered 2018-04-07: 2.5 mg via ORAL
  Filled 2018-04-06 (×2): qty 0.5

## 2018-04-06 MED ORDER — ALBUTEROL SULFATE (2.5 MG/3ML) 0.083% IN NEBU: 2.5000 mg | INHALATION_SOLUTION | RESPIRATORY_TRACT | Status: DC | PRN

## 2018-04-06 MED ORDER — POLYETHYLENE GLYCOL 3350 17 G PO PACK
17.0000 g | PACK | Freq: Every day | ORAL | Status: DC | PRN
  Administered 2018-04-07: 17 g via ORAL
  Filled 2018-04-06: qty 1

## 2018-04-06 MED ORDER — ACETAMINOPHEN 500 MG PO TABS
500.0000 mg | ORAL_TABLET | Freq: Four times a day (QID) | ORAL | Status: DC | PRN
  Administered 2018-04-06 – 2018-04-08 (×3): 500 mg
  Filled 2018-04-06 (×3): qty 1

## 2018-04-06 MED ORDER — ADULT MULTIVITAMIN LIQUID CH
15.0000 mL | Freq: Every day | ORAL | Status: DC
  Administered 2018-04-07 – 2018-04-08 (×2): 15 mL
  Filled 2018-04-06 (×2): qty 15

## 2018-04-06 MED ORDER — INSULIN ASPART 100 UNIT/ML ~~LOC~~ SOLN
0.0000 [IU] | Freq: Three times a day (TID) | SUBCUTANEOUS | Status: DC
  Administered 2018-04-07: 13:00:00 3 [IU] via SUBCUTANEOUS
  Administered 2018-04-07: 7 [IU] via SUBCUTANEOUS
  Administered 2018-04-07: 18:00:00 1 [IU] via SUBCUTANEOUS
  Administered 2018-04-08: 5 [IU] via SUBCUTANEOUS
  Administered 2018-04-08: 3 [IU] via SUBCUTANEOUS
  Administered 2018-04-08: 10:00:00 2 [IU] via SUBCUTANEOUS
  Filled 2018-04-06 (×6): qty 1

## 2018-04-06 MED ORDER — QUETIAPINE FUMARATE 25 MG PO TABS: 50.0000 mg | ORAL_TABLET | Freq: Every day | ORAL | Status: DC

## 2018-04-06 MED ORDER — PRO-STAT SUGAR FREE PO LIQD
30.0000 mL | Freq: Two times a day (BID) | ORAL | Status: DC
  Administered 2018-04-07 – 2018-04-08 (×3): 30 mL

## 2018-04-06 MED ORDER — PROMETHAZINE HCL 6.25 MG/5ML PO SYRP
6.2500 mg | ORAL_SOLUTION | Freq: Four times a day (QID) | ORAL | Status: DC | PRN
  Administered 2018-04-07 – 2018-04-08 (×2): 6.25 mg
  Filled 2018-04-06 (×3): qty 5

## 2018-04-06 MED ORDER — MIDODRINE HCL 5 MG PO TABS
5.0000 mg | ORAL_TABLET | Freq: Three times a day (TID) | ORAL | Status: DC
  Administered 2018-04-07 – 2018-04-08 (×6): 5 mg
  Filled 2018-04-06 (×6): qty 1

## 2018-04-06 MED ORDER — FAMOTIDINE 20 MG PO TABS
10.0000 mg | ORAL_TABLET | Freq: Every day | ORAL | Status: DC
  Administered 2018-04-07 – 2018-04-08 (×2): 10 mg
  Filled 2018-04-06 (×2): qty 1

## 2018-04-06 MED ORDER — ATORVASTATIN CALCIUM 20 MG PO TABS
40.0000 mg | ORAL_TABLET | Freq: Every day | ORAL | Status: DC
  Administered 2018-04-07 – 2018-04-08 (×2): 40 mg
  Filled 2018-04-06 (×2): qty 2

## 2018-04-06 MED ORDER — OSMOLITE 1.5 CAL PO LIQD
1000.0000 mL | ORAL | Status: DC
  Administered 2018-04-07: 1000 mL

## 2018-04-06 MED ORDER — HEPARIN SODIUM (PORCINE) 5000 UNIT/ML IJ SOLN
5000.0000 [IU] | Freq: Three times a day (TID) | INTRAMUSCULAR | Status: DC
  Administered 2018-04-08: 14:00:00 5000 [IU] via SUBCUTANEOUS
  Filled 2018-04-06 (×2): qty 1

## 2018-04-06 MED ORDER — INSULIN ASPART 100 UNIT/ML ~~LOC~~ SOLN
0.0000 [IU] | Freq: Every day | SUBCUTANEOUS | Status: DC
  Administered 2018-04-07: 02:00:00 3 [IU] via SUBCUTANEOUS
  Filled 2018-04-06: qty 1

## 2018-04-06 NOTE — ED Notes (Signed)
Pt changed by this RN and Donato Schultz, EDT.  External catheter placed and on low continuous suction.

## 2018-04-06 NOTE — Progress Notes (Signed)
Patient's daughter at bedside and states that pt. Is not to have CT scan as ordered.

## 2018-04-06 NOTE — ED Notes (Signed)
ED Provider at bedside. 

## 2018-04-06 NOTE — H&P (Signed)
[place] at [place] NAME: [name]    MR#:  [id number]  DATE OF BIRTH:  1927-11-14  DATE OF ADMISSION:  04/06/2018  PRIMARY CARE PHYSICIAN: Deland Pretty, MD   REQUESTING/REFERRING PHYSICIAN: Lenise Arena, MD  CHIEF COMPLAINT:   Chief Complaint  Patient presents with  . Altered Mental Status    HISTORY OF PRESENT ILLNESS:  [name]  is a 85 y.o. female with a known history of hypertension, history of stroke, type 2 diabetes, ESRD on HD, CAD who was sent to the ED from her dialysis center due to altered mental status.  She was unable to get her dialysis session today. No family present in the room, so unable to obtain much history.  Patient denies any pain.   In the ED, labs are significant for troponin 0 0.14, hemoglobin 8.6.  Chest x-ray with cardiomegaly, small pleural effusions, and pulmonary edema.  Hospitalists were called for admission.  PAST MEDICAL HISTORY:   Past Medical History:  Diagnosis Date  . Acute renal injury ([place]) 07/2016  . Anginal pain ([place]) 2004; 2011  . CAD (coronary artery disease)    status post stenting of the RCA status post CABG. (left internal mammary artery to  LAD, saphenous vein graft to second diagonal, saphenous vein  graft to obtuse marginal 1, saphenous vein graft to posterior  descending).  . Cardiomyopathy     Mildly reduced EF (45%).   . Chronic kidney disease (CKD), stage III (moderate) ([place])   . History of blood transfusion 1960s   "related to anemia"  . HTN (hypertension)   . Pleural effusion 2011   S/P "bypass"  . Shortness of breath   . Stroke ([place]) 09/19/2016   "left sided weakness" (09/20/2016)  . Type II diabetes mellitus ([place])     PAST SURGICAL HISTORY:   Past Surgical History:  Procedure Laterality Date  . BOTOX INJECTION  11/20/2017   Procedure: BOTOX INJECTION;  Surgeon: Doran Stabler, MD;  Location: [facility];  Service: Gastroenterology;;  . CAPD  INSERTION N/A 03/29/2018   Procedure: LAPAROSCOPIC INSERTION CONTINUOUS AMBULATORY PERITONEAL DIALYSIS  (CAPD) CATHETER;  Surgeon: Algernon Huxley, MD;  Location: [facility] ORS;  Service: Vascular;  Laterality: N/A;  . CATARACT EXTRACTION W/ INTRAOCULAR LENS  IMPLANT, BILATERAL Bilateral   . CORONARY ANGIOPLASTY WITH STENT PLACEMENT  2004   "LAD & one of the circumflex"  . CORONARY ARTERY BYPASS GRAFT  2011   CABG X4  . DIALYSIS/PERMA CATHETER INSERTION N/A 03/05/2018   Procedure: DIALYSIS/PERMA CATHETER INSERTION;  Surgeon: Algernon Huxley, MD;  Location: [facility];  Service: Cardiovascular;  Laterality: N/A;  . ESOPHAGOGASTRODUODENOSCOPY (EGD) WITH PROPOFOL N/A 11/20/2017   Procedure: ESOPHAGOGASTRODUODENOSCOPY (EGD) WITH PROPOFOL;  Surgeon: Doran Stabler, MD;  Location: [facility];  Service: Gastroenterology;  Laterality: N/A;  . IR GASTROSTOMY TUBE MOD SED  12/10/2017    SOCIAL HISTORY:   Social History   Tobacco Use  . Smoking status: Never Smoker  . Smokeless tobacco: Never Used  Substance Use Topics  . Alcohol use: No    FAMILY HISTORY:   Family History  Problem Relation Age of Onset  . Diabetes Father 30  . Unexplained death Mother 40  . CAD Neg Hx     DRUG ALLERGIES:   Allergies  Allergen Reactions  . Reglan [Metoclopramide]     Pt's son reports this causes patient tremors and neurologic issues   .  Contrast Media [Iodinated Diagnostic Agents]     Sensitivity, kidney issues    REVIEW OF SYSTEMS:   ROS- unable to obtain secondary to altered mental status  MEDICATIONS AT HOME:   Prior to Admission medications   Medication Sig Start Date End Date Taking? Authorizing Provider  acetaminophen (TYLENOL) 160 MG/5ML solution Place 15.6 mLs (500 mg total) into feeding tube every 6 (six) hours as needed for mild pain or fever. 02/26/18  Yes Samuella Cota, MD  Amino Acids-Protein Hydrolys (FEEDING SUPPLEMENT, PRO-STAT SUGAR FREE 64,) LIQD Place 30 mLs into  feeding tube 2 (two) times daily. 04/02/18  Yes Vaughan Basta, MD  atorvastatin (LIPITOR) 40 MG tablet Place 1 tablet (40 mg total) into feeding tube daily at 6 PM. 04/02/18  Yes Vaughan Basta, MD  B Complex-C (B-COMPLEX WITH VITAMIN C) tablet Place 1 tablet into feeding tube daily. 04/02/18  Yes Vaughan Basta, MD  chlorhexidine (PERIDEX) 0.12 % solution 15 mLs by Mouth Rinse route 2 (two) times daily. 02/26/18  Yes Samuella Cota, MD  cholecalciferol (VITAMIN D3) 10 MCG (400 UNIT) TABS tablet Place 1 tablet (400 Units total) into feeding tube daily. 02/27/18  Yes Samuella Cota, MD  clopidogrel (PLAVIX) 75 MG tablet Place 1 tablet (75 mg total) into feeding tube daily. 04/02/18  Yes Vaughan Basta, MD  collagenase (SANTYL) ointment Apply topically daily. 04/02/18  Yes Vaughan Basta, MD  famotidine (PEPCID) 10 MG tablet Place 1 tablet (10 mg total) into feeding tube daily. 02/27/18  Yes Samuella Cota, MD  insulin aspart (NOVOLOG) 100 UNIT/ML injection Inject 0-9 Units into the skin every 4 (four) hours. CBG 70 - 120: 0 units CBG 121 - 150: 1 unit CBG 151 - 200: 2 units CBG 201 - 250: 3 units CBG 251 - 300: 5 units CBG 301 - 350: 7 units CBG 351 - 400: 9 units 04/02/18  Yes Vaughan Basta, MD  Melatonin 3 MG TABS Take 1 tablet (3 mg total) by mouth at bedtime as needed (insomnia). 04/02/18  Yes Vaughan Basta, MD  midodrine (PROAMATINE) 5 MG tablet Place 1 tablet (5 mg total) into feeding tube 3 (three) times daily with meals. 04/02/18  Yes Vaughan Basta, MD  mouth rinse LIQD solution 15 mLs by Mouth Rinse route 2 times daily at 12 noon and 4 pm. 02/26/18  Yes Samuella Cota, MD  Multiple Vitamin (MULTIVITAMIN) LIQD Place 15 mLs into feeding tube daily. 02/27/18  Yes Samuella Cota, MD  polyethylene glycol [facility] Floria Raveling) packet Take 17 g by mouth daily as needed for mild constipation. 04/02/18  Yes Vaughan Basta, MD    promethazine (PHENERGAN) 6.25 MG/5ML syrup Place 5 mLs (6.25 mg total) into feeding tube 4 (four) times daily as needed for nausea or vomiting. 04/02/18 04/02/19 Yes Vaughan Basta, MD  albuterol (PROVENTIL) (2.5 MG/3ML) 0.083% nebulizer solution Take 3 mLs (2.5 mg total) by nebulization every 4 (four) hours as needed for wheezing or shortness of breath. 02/26/18   Samuella Cota, MD  ipratropium (ATROVENT) 0.02 % nebulizer solution Take 2.5 mLs (0.5 mg total) by nebulization every 6 (six) hours as needed for wheezing or shortness of breath. 02/26/18   Samuella Cota, MD  nitroGLYCERIN (NITROSTAT) 0.4 MG SL tablet Place 1 tablet (0.4 mg total) under the tongue every 5 (five) minutes x 3 doses as needed for chest pain. 02/26/18   Samuella Cota, MD  nutrition supplement, JUVEN, Fanny Dance) PACK Place 1 packet into feeding tube  2 (two) times daily between meals. 04/02/18   Vaughan Basta, MD  Nutritional Supplements (FEEDING SUPPLEMENT, OSMOLITE 1.5 CAL,) LIQD Place 1,000 mLs into feeding tube continuous. 40 ml/hour continuous. 04/02/18   Vaughan Basta, MD  Water For Irrigation, Sterile (FREE WATER) SOLN Place 30 mLs into feeding tube every 8 (eight) hours. 02/26/18   Samuella Cota, MD      VITAL SIGNS:  Blood pressure 127/65, pulse 72, temperature 97.7 F (36.5 C), temperature source Axillary, resp. rate 16, height 5\' 3"  (1.6 m), weight 77 kg, SpO2 100 %.  PHYSICAL EXAMINATION:  Physical Exam  GENERAL:  85 y.o.-year-old patient lying in the bed with no acute distress.  Occasionally yelling out. EYES: Pupils equal, round, reactive to light and accommodation. No scleral icterus. Extraocular muscles intact.  HEENT: Head atraumatic, normocephalic. Oropharynx and nasopharynx clear.  NECK:  Supple, no jugular venous distention. No thyroid enlargement, no tenderness. +Right IJ HD catheter in place. LUNGS: Normal breath sounds bilaterally, no wheezing, rales,rhonchi or  crepitation. No use of accessory muscles of respiration.  CARDIOVASCULAR: RRR, S1, S2 normal. No murmurs, rubs, or gallops.  ABDOMEN: Soft, nontender, nondistended. Bowel sounds present. No organomegaly or mass. + PEG tube in place. +peritoneal dialysis catheter in place. EXTREMITIES: No cyanosis, or clubbing. + Diffuse edema of all extremities NEUROLOGIC: Unable to assess PSYCHIATRIC: Unable to assess SKIN: No obvious rash, lesion, or ulcer.   LABORATORY PANEL:   CBC Recent Labs  Lab 04/06/18 1727  WBC 8.6  HGB 8.6*  HCT 29.2*  PLT 196   ------------------------------------------------------------------------------------------------------------------  Chemistries  Recent Labs  Lab 04/06/18 1727  NA 137  K 4.7  CL 99  CO2 27  GLUCOSE 346*  BUN 76*  CREATININE 2.50*  CALCIUM 9.6  AST 38  ALT 21  ALKPHOS 100  BILITOT 1.1   ------------------------------------------------------------------------------------------------------------------  Cardiac Enzymes Recent Labs  Lab 04/06/18 1727  TROPONINI 0.14*   ------------------------------------------------------------------------------------------------------------------  RADIOLOGY:  Dg Chest 1 View  Result Date: 04/06/2018 CLINICAL DATA:  Altered mental status EXAM: CHEST  1 VIEW COMPARISON:  03/30/2018 FINDINGS: Mild cardiomegaly with CABG markers and median sternotomy wires. Right IJ hemodialysis catheter tip projects over the right atrium. There are bilateral basilar predominant opacities with small pleural effusions. IMPRESSION: Cardiomegaly, small pleural effusions and mild pulmonary edema, compatible with congestive heart failure Electronically Signed   By: Ulyses Jarred M.D.   On: 04/06/2018 18:37      IMPRESSION AND PLAN:   Altered mental status-unclear etiology.  No signs of infection. Will obtain CT head, UDS, ABG, HIV, RPR, ammonia, TSH, vitamin B12, folate.  Start Seroquel 50 mg nightly.  Acute hypoxic  respiratory failure- likely due to pulmonary edema.  Will likely improve after dialysis session.  Wean O2 as able.  Check ABG.    Elevated troponin-troponin elevated to 0.14.  This is actually much lower than its been in the past.  No active chest pain. Will trend troponins.  ESRD on HD MWF-patient unable to receive dialysis this morning.  Nephrology consult.  Chronic combined congestive heart failure-chest x-ray with pulmonary edema.  Fluid management per nephrology.  History of dysphasia secondary to achalasia-s/p Botox injections in the past.  Patient has PEG tube.  Type 2 diabetes- blood sugars elevated in the ED.  SSI while hospitalized.  Paroxysmal atrial fibrillation-rate controlled.  Patient not on any anticoagulation at home.  Monitor.  History of hypotension- blood pressures normal in the ED.  Continue home midodrine 5 mg 3 times  daily  History of stroke-continue Plavix and statin  Anemia of chronic kidney disease- hemoglobin at baseline.  EPO with dialysis.  All the records are reviewed and case discussed with ED provider. Management plans discussed with the patient, family and they are in agreement.  CODE STATUS: Full  TOTAL TIME TAKING CARE OF THIS PATIENT: 45 minutes.    Berna Spare Tommye Lehenbauer M.D on 04/06/2018 at 8:55 PM  Between 7am to 6pm - Pager - [phone]  After 6pm go to [email or web address] - Proofreader  [facility]  Office  [phone]  CC: Primary care physician; Deland Pretty, MD   Note: This dictation was prepared with Dragon dictation along with smaller phrase technology. Any transcriptional errors that result from this process are unintentional.

## 2018-04-06 NOTE — Progress Notes (Signed)
Daughter has refused abg for this patient based on the discussion she states her brother had with ED MD at the time patient was seen by the physician. RN aware of refusal.

## 2018-04-06 NOTE — ED Notes (Signed)
ED TO INPATIENT HANDOFF REPORT  ED Nurse Name and Phone #: Annie Main [phone]  S Name/Age/Gender [name] 85 y.o. female Room/Bed: ED10A/ED10A  Code Status   Code Status: Prior  Home/SNF/Other Nursing Home {Patient oriented to: UTA, only screaming Is this baseline? No   Triage Complete: Triage complete  Chief Complaint Ams  Triage Note Pt to ED via EMS from dialysis for AMS.  Per EMS dialysis not comfortable doing treatment, unknown neuro baseline, hx of CVA, DBM and afib.  Had full treatment dialysis on Monday.  Pt not able to follow commands, is alert, only making moaning and screaming noises.  EMS vitals 123/75 BP, 90-130 HR, 97.7 temp, CBG 428, 100% 2L [place] chronic.   Allergies Allergies  Allergen Reactions  . Reglan [Metoclopramide]     Pt's son reports this causes patient tremors and neurologic issues   . Contrast Media [Iodinated Diagnostic Agents]     Sensitivity, kidney issues    Level of Care/Admitting Diagnosis ED Disposition    ED Disposition Condition [facility] Area: [place] [100120]  Level of Care: Med-Surg [16]  Diagnosis: AMS (altered mental status) [1607371]  Admitting Physician: Hyman Bible DODD [[id number]]  Attending Physician: Hyman Bible DODD [[id number]]  PT Class (Do Not Modify): Observation [104]  PT Acc Code (Do Not Modify): Observation [10022]       B Medical/Surgery History Past Medical History:  Diagnosis Date  . Acute renal injury ([place]) 07/2016  . Anginal pain ([place]) 2004; 2011  . CAD (coronary artery disease)    status post stenting of the RCA status post CABG. (left internal mammary artery to  LAD, saphenous vein graft to second diagonal, saphenous vein  graft to obtuse marginal 1, saphenous vein graft to posterior  descending).  . Cardiomyopathy     Mildly reduced EF (45%).   . Chronic kidney disease (CKD), stage III (moderate) ([place])   . History of blood transfusion 1960s   "related to anemia"   . HTN (hypertension)   . Pleural effusion 2011   S/P "bypass"  . Shortness of breath   . Stroke ([place]) 09/19/2016   "left sided weakness" (09/20/2016)  . Type II diabetes mellitus ([place])    Past Surgical History:  Procedure Laterality Date  . BOTOX INJECTION  11/20/2017   Procedure: BOTOX INJECTION;  Surgeon: Doran Stabler, MD;  Location: [facility];  Service: Gastroenterology;;  . CAPD INSERTION N/A 03/29/2018   Procedure: LAPAROSCOPIC INSERTION CONTINUOUS AMBULATORY PERITONEAL DIALYSIS  (CAPD) CATHETER;  Surgeon: Algernon Huxley, MD;  Location: [facility];  Service: Vascular;  Laterality: N/A;  . CATARACT EXTRACTION W/ INTRAOCULAR LENS  IMPLANT, BILATERAL Bilateral   . CORONARY ANGIOPLASTY WITH STENT PLACEMENT  2004   "LAD & one of the circumflex"  . CORONARY ARTERY BYPASS GRAFT  2011   CABG X4  . DIALYSIS/PERMA CATHETER INSERTION N/A 03/05/2018   Procedure: DIALYSIS/PERMA CATHETER INSERTION;  Surgeon: Algernon Huxley, MD;  Location: [facility];  Service: Cardiovascular;  Laterality: N/A;  . ESOPHAGOGASTRODUODENOSCOPY (EGD) WITH PROPOFOL N/A 11/20/2017   Procedure: ESOPHAGOGASTRODUODENOSCOPY (EGD) WITH PROPOFOL;  Surgeon: Doran Stabler, MD;  [facility];  Service: Gastroenterology;  Laterality: N/A;  . IR GASTROSTOMY TUBE MOD SED  12/10/2017     A IV Location/Drains/Wounds Patient Lines/Drains/Airways Status   Active Line/Drains/Airways    Name:   Placement date:   Placement time:   Site:   Days:   Peripheral IV  04/06/18 Right External jugular   04/06/18    1730    External jugular   less than 1   Hemodialysis Catheter Right Subclavian Double-lumen;Permanent   03/05/18    -    Subclavian   32   Gastrostomy/Enterostomy Gastrostomy 20 Fr. LUQ   12/10/17    0922    LUQ   117   Incision (Closed) 03/29/18 Abdomen   03/29/18    1636     8   Pressure Injury 11/04/17 Unstageable - Full thickness tissue loss in which the base of the ulcer is covered by slough (yellow,  tan, gray, green or brown) and/or eschar (tan, brown or black) in the wound bed. stage 2 was noted as present on admission, has   11/04/17    1530     153   Pressure Injury 03/20/18 Stage II -  Partial thickness loss of dermis presenting as a shallow open ulcer with a red, pink wound bed without slough.   03/20/18    1000     17          Intake/Output Last 24 hours No intake or output data in the 24 hours ending 04/06/18 2139  Labs/Imaging Results for orders placed or performed during the hospital encounter of 04/06/18 (from the past 48 hour(s))  CBC with Differential     Status: Abnormal   Collection Time: 04/06/18  5:27 PM  Result Value Ref Range   WBC 8.6 4.0 - 10.5 K/uL   RBC 2.89 (L) 3.87 - 5.11 MIL/uL   Hemoglobin 8.6 (L) 12.0 - 15.0 g/dL   HCT 29.2 (L) 36.0 - 46.0 %   MCV 101.0 (H) 80.0 - 100.0 fL   MCH 29.8 26.0 - 34.0 pg   MCHC 29.5 (L) 30.0 - 36.0 g/dL   RDW 17.6 (H) 11.5 - 15.5 %   Platelets 196 150 - 400 K/uL   nRBC 0.4 (H) 0.0 - 0.2 %   Neutrophils Relative % 76 %   Neutro Abs 6.5 1.7 - 7.7 K/uL   Lymphocytes Relative 11 %   Lymphs Abs 0.9 0.7 - 4.0 K/uL   Monocytes Relative 11 %   Monocytes Absolute 0.9 0.1 - 1.0 K/uL   Eosinophils Relative 2 %   Eosinophils Absolute 0.2 0.0 - 0.5 K/uL   Basophils Relative 0 %   Basophils Absolute 0.0 0.0 - 0.1 K/uL   WBC Morphology MORPHOLOGY UNREMARKABLE    Smear Review PLATELETS APPEAR ADEQUATE    Immature Granulocytes 0 %   Abs Immature Granulocytes 0.03 0.00 - 0.07 K/uL   Polychromasia PRESENT    Target Cells PRESENT     Comment: Performed at [facility], [address] [place],  [address]  Comprehensive metabolic panel     Status: Abnormal   Collection Time: 04/06/18  5:27 PM  Result Value Ref Range   Sodium 137 135 - 145 mmol/L   Potassium 4.7 3.5 - 5.1 mmol/L   Chloride 99 98 - 111 mmol/L   CO2 27 22 - 32 mmol/L   Glucose, Bld 346 (H) 70 - 99 mg/dL   BUN 76 (H) 8 - 23 mg/dL   Creatinine, Ser  2.50 (H) 0.44 - 1.00 mg/dL   Calcium 9.6 8.9 - 10.3 mg/dL   Total Protein 6.6 6.5 - 8.1 g/dL   Albumin 2.3 (L) 3.5 - 5.0 g/dL   AST 38 15 - 41 U/L   ALT 21 0 - 44 U/L   Alkaline Phosphatase  100 38 - 126 U/L   Total Bilirubin 1.1 0.3 - 1.2 mg/dL   GFR calc non Af Amer 17 (L) >60 mL/min   GFR calc Af Amer 19 (L) >60 mL/min   Anion gap 11 5 - 15    Comment: Performed at [facility], [address] [place], [place] [address]  Troponin I - ONCE - STAT     Status: Abnormal   Collection Time: 04/06/18  5:27 PM  Result Value Ref Range   Troponin I 0.14 (HH) <0.03 ng/mL    Comment: CRITICAL RESULT CALLED TO, READ BACK BY AND VERIFIED WITH Roth Ress AT 1804 04/06/2018.PMF Performed at [facility], [address] [place], [place] [address]    Dg Chest 1 View  Result Date: 04/06/2018 CLINICAL DATA:  Altered mental status EXAM: CHEST  1 VIEW COMPARISON:  03/30/2018 FINDINGS: Mild cardiomegaly with CABG markers and median sternotomy wires. Right IJ hemodialysis catheter tip projects over the right atrium. There are bilateral basilar predominant opacities with small pleural effusions. IMPRESSION: Cardiomegaly, small pleural effusions and mild pulmonary edema, compatible with congestive heart failure Electronically Signed   By: Ulyses Jarred M.D.   On: 04/06/2018 18:37    Pending Labs [employer] (From admission, onward)    Start     Ordered   Signed and Occupational hygienist morning,   R     Signed and Held   Visual merchandiser and Held  CBC  Tomorrow morning,   R     Signed and Held   Visual merchandiser and Held  Urine Drug Screen, Biomedical engineer ([facility] only)  Once,   R     Signed and Held   Visual merchandiser and Held  Blood gas, arterial  Once,   R     Signed and Held   Signed and Held  Troponin I - Now Then Q6H  Now then every 6 hours,   R     Signed and Held   Signed and Held  HIV Antibody (routine testing w rflx)  Once,   R     Signed and Held   Signed and Held  RPR  Once,   R      Signed and Held   Signed and Held  TSH  Once,   R     Signed and Held   Signed and Held  Ammonia  Once,   R     Signed and Held   Signed and Held  Vitamin B12  Once,   R     Signed and Held   Signed and Held  Folate  Once,   R     Signed and Held          Vitals/Pain Today's Vitals   04/06/18 1800 04/06/18 1815 04/06/18 1830 04/06/18 1845  BP:  (!) 111/58 118/75 127/65  Pulse:      Resp: 15 (!) 22  16  Temp:      TempSrc:      SpO2:      Weight:      Height:        Isolation Precautions No active isolations  Medications Medications - No data to display  Mobility non-ambulatory High fall risk   Focused Assessments Neuro Assessment Handoff:  Swallow screen pass? No          Neuro Assessment: Exceptions to WDL Neuro Checks:      Last Documented NIHSS Modified Score:   Has TPA been given?  No If patient is a Neuro Trauma and patient is going to OR before floor call report to [place] nurse: [phone] or [phone]     R Recommendations: See Admitting Provider Note  Report given to:   Additional Notes: Pt only screaming, nonverbal, not baseline per son.  Has lift underneath patient.

## 2018-04-06 NOTE — ED Provider Notes (Signed)
[facility] Emergency Department Provider Note       Time seen: ----------------------------------------- 5:15 PM on 04/06/2018 -----------------------------------------  Level V caveat: History/ROS limited by altered mental status I have reviewed the triage vital signs and the nursing notes.  HISTORY   Chief Complaint No chief complaint on file.    HPI [name] is a 85 y.o. female with a history of end-stage renal disease on dialysis, coronary artery disease, cardiomyopathy, hypertension, pleural effusion, CVA with chronic left-sided weakness, diabetes who presents to the ED for altered mental status.  Patient reportedly had normal dialysis on Wednesday.  At the dialysis center today, patient noted to have altered mental status and was sent here for further evaluation.  Did not feel comfortable dialyzing her today.  Vital signs were reportedly within normal limits prior to arrival.  Past Medical History:  Diagnosis Date  . Acute renal injury ([place]) 07/2016  . Anginal pain ([place]) 2004; 2011  . CAD (coronary artery disease)    status post stenting of the RCA status post CABG. (left internal mammary artery to  LAD, saphenous vein graft to second diagonal, saphenous vein  graft to obtuse marginal 1, saphenous vein graft to posterior  descending).  . Cardiomyopathy     Mildly reduced EF (45%).   . Chronic kidney disease (CKD), stage III (moderate) ([place])   . History of blood transfusion 1960s   "related to anemia"  . HTN (hypertension)   . Pleural effusion 2011   S/P "bypass"  . Shortness of breath   . Stroke ([place]) 09/19/2016   "left sided weakness" (09/20/2016)  . Type II diabetes mellitus [facility])     Patient Active Problem List   Diagnosis Date Noted  . Unstageable pressure ulcer of sacral region ([place]) 03/22/2018  . Paroxysmal atrial fibrillation (HCC)   . Feeding tube dysfunction   . Acute renal failure (ARF) ([place]) 02/26/2018  . Acute metabolic  encephalopathy 65/46/5035  . Acute on chronic combined systolic and diastolic CHF (congestive heart failure) ([place]) 02/21/2018  . Anasarca 02/21/2018  . CKD (chronic kidney disease) stage 5, GFR less than 15 ml/min (HCC) 02/11/2018  . Acute respiratory failure ([place])   . NSTEMI (non-ST elevated myocardial infarction) ([place])   . Palliative care by specialist   . Dysarthria 12/05/2017  . Adult failure to thrive 12/05/2017  . UTI (urinary tract infection) 12/05/2017  . Abnormal loss of weight   . Abnormal finding on GI tract imaging   . Nausea and vomiting   . Dysphagia   . Bilious vomiting with nausea   . Enlarged lymph node   . Pressure injury of skin 11/05/2017  . Dehydration 11/04/2017  . Abdominal pain 11/04/2017  . Quadriceps strain, left, subsequent encounter 05/02/2017  . Rectal bleed 10/13/2016  . Malnutrition of moderate degree 10/13/2016  . Chest pain with moderate risk for cardiac etiology 10/06/2016  . Labile blood pressure   . Stage 3 chronic kidney disease ([place])   . AKI (acute kidney injury) ([place])   . Acute blood loss anemia   . Small vessel disease ([place]) 09/22/2016  . Hemiparesis affecting left side as late effect of cerebrovascular accident (CVA) ([place]) 09/22/2016  . Gait disturbance, post-stroke 09/22/2016  . Stroke (cerebrum) ([place]) 09/20/2016  . Diabetes mellitus with complication ([place])   . Ischemic stroke ([place])   . Status post intraocular lens implant 06/23/2011  . Glaucoma suspect 12/30/2010  . Macular hole 12/30/2010  . Nonproliferative diabetic retinopathy ([place]) 12/30/2010  .  Syncope 06/17/2010  . CARDIOMYOPATHY 04/15/2010  . Pleural effusion 04/14/2010  . SHORTNESS OF BREATH 04/14/2010  . CORONARY ATHEROSCLEROSIS NATIVE CORONARY ARTERY 01/19/2009  . Hyperlipidemia 01/06/2009  . Diabetes mellitus ([place]) 01/05/2009  . Essential hypertension 01/05/2009  . Coronary atherosclerosis 01/05/2009    Past Surgical History:  Procedure Laterality Date  . BOTOX  INJECTION  11/20/2017   Procedure: BOTOX INJECTION;  Surgeon: Doran Stabler, MD;  [facility];  Service: Gastroenterology;;  . CAPD INSERTION N/A 03/29/2018   Procedure: LAPAROSCOPIC INSERTION CONTINUOUS AMBULATORY PERITONEAL DIALYSIS  (CAPD) CATHETER;  Surgeon: Algernon Huxley, MD;  Location: [facility];  Service: Vascular;  Laterality: N/A;  . CATARACT EXTRACTION W/ INTRAOCULAR LENS  IMPLANT, BILATERAL Bilateral   . CORONARY ANGIOPLASTY WITH STENT PLACEMENT  2004   "LAD & one of the circumflex"  . CORONARY ARTERY BYPASS GRAFT  2011   CABG X4  . DIALYSIS/PERMA CATHETER INSERTION N/A 03/05/2018   Procedure: DIALYSIS/PERMA CATHETER INSERTION;  Surgeon: Algernon Huxley, MD;  Location: [facility];  Service: Cardiovascular;  Laterality: N/A;  . ESOPHAGOGASTRODUODENOSCOPY (EGD) WITH PROPOFOL N/A 11/20/2017   Procedure: ESOPHAGOGASTRODUODENOSCOPY (EGD) WITH PROPOFOL;  Surgeon: Doran Stabler, MD;  [facility];  Service: Gastroenterology;  Laterality: N/A;  . IR GASTROSTOMY TUBE MOD SED  12/10/2017    Allergies Reglan [metoclopramide] and Contrast media [iodinated diagnostic agents]  Social History Social History   Tobacco Use  . Smoking status: Never Smoker  . Smokeless tobacco: Never Used  Substance Use Topics  . Alcohol use: No  . Drug use: No   Review of Systems Constitutional: Negative for fever. Cardiovascular: Negative for chest pain. Respiratory: Negative for shortness of breath. Gastrointestinal: Negative for abdominal pain, vomiting and diarrhea. Genitourinary: Negative for dysuria. Musculoskeletal: Negative for back pain. Skin: Negative for rash. Neurological: Negative for headaches, focal weakness or numbness.  All systems negative/normal/unremarkable except as stated in the HPI  ____________________________________________   PHYSICAL EXAM:  VITAL SIGNS: ED Triage Vitals  Enc Vitals Group     BP      Pulse      Resp      Temp       Temp src      SpO2      Weight      Height      Head Circumference      Peak Flow      Pain Score      Pain Loc      Pain Edu?      Excl. in [place]?     Constitutional: Alert but disoriented, mild distress ENT      Head: Normocephalic and atraumatic.      Nose: No congestion/rhinnorhea.      Mouth/Throat: Mucous membranes are moist.      Neck: No stridor.  JVD is noted Cardiovascular: Normal rate, regular rhythm. No murmurs, rubs, or gallops. Respiratory: Normal respiratory effort without tachypnea nor retractions. Breath sounds are clear and equal bilaterally. No wheezes/rales/rhonchi. Gastrointestinal: Peritoneal dialysis catheter is present as well as a G-tube.  Some purulent drainage noted from around the G-tube Musculoskeletal: Widespread massive edema is noted Neurologic: Patient does not follow commands and appears confused. Skin:  Skin is warm, dry and intact.  Some pallor is noted Psychiatric: Patient is confused and cannot follow commands, speech and behavior are abnormal ____________________________________________  EKG: Interpreted by me.  Atrial fibrillation with a rate of 112 bpm, low voltage, normal axis, normal  QT  ____________________________________________  ED COURSE:  As part of my medical decision making, I reviewed the following data within the [place] History obtained from family if available, nursing notes, old chart and ekg, as well as notes from prior ED visits. Patient presented for altered mental status, we will assess with labs and imaging as indicated at this time.   Procedures ____________________________________________   LABS (pertinent positives/negatives)  Labs Reviewed  CBC WITH DIFFERENTIAL/PLATELET - Abnormal; Notable for the following components:      Result Value   RBC 2.89 (*)    Hemoglobin 8.6 (*)    HCT 29.2 (*)    MCV 101.0 (*)    MCHC 29.5 (*)    RDW 17.6 (*)    nRBC 0.4 (*)    All other components within  normal limits  COMPREHENSIVE METABOLIC PANEL - Abnormal; Notable for the following components:   Glucose, Bld 346 (*)    BUN 76 (*)    Creatinine, Ser 2.50 (*)    Albumin 2.3 (*)    GFR calc non Af Amer 17 (*)    GFR calc Af Amer 19 (*)    All other components within normal limits  TROPONIN I - Abnormal; Notable for the following components:   Troponin I 0.14 (*)    All other components within normal limits  CBG MONITORING, ED    RADIOLOGY Images were viewed by me  chest x-ray Reveal bibasilar effusions ____________________________________________   DIFFERENTIAL DIAGNOSIS   Dehydration, electrolyte abnormality, sepsis, pneumonia, CVA, encephalopathy  FINAL ASSESSMENT AND PLAN  Altered mental status, end-stage renal disease on dialysis   Plan: The patient had presented for altered mental status today for which she was not dialyzed. Patient's labs do not reveal any significant change from prior. Patient's imaging was stable with bibasilar effusions but no other acute process.  Likely encephalopathy from renal failure, no other obvious etiology.  I will discuss with the hospitalist for admission and dialysis in the hospital.   Laurence Aly, MD    Note: This note was generated in part or whole with voice recognition software. Voice recognition is usually quite accurate but there are transcription errors that can and very often do occur. I apologize for any typographical errors that were not detected and corrected.     Earleen Newport, MD 04/06/18 [phone]

## 2018-04-06 NOTE — ED Triage Notes (Signed)
Pt to ED via EMS from dialysis for AMS.  Per EMS dialysis not comfortable doing treatment, unknown neuro baseline, hx of CVA, DBM and afib.  Had full treatment dialysis on Monday.  Pt not able to follow commands, is alert, only making moaning and screaming noises.  EMS vitals 123/75 BP, 90-130 HR, 97.7 temp, CBG 428, 100% 2L [place] chronic.

## 2018-04-06 NOTE — ED Notes (Signed)
Date and time results received: 04/06/18 6:03 PM (use smartphrase ".now" to insert current time)  Test: Troponin Critical Value: 0.14  Name of Provider Notified: Dr. Jimmye Norman  Orders Received? Or Actions Taken?: No new orders at this time.

## 2018-04-07 ENCOUNTER — Other Ambulatory Visit: Payer: Self-pay

## 2018-04-07 ENCOUNTER — Encounter: Payer: Self-pay | Admitting: *Deleted

## 2018-04-07 LAB — TROPONIN I
Troponin I: 0.1 ng/mL (ref <0.03)
Troponin I: 0.13 ng/mL (ref <0.03)

## 2018-04-07 LAB — BASIC METABOLIC PANEL
Anion gap: 9 (ref 5–15)
BUN: 79 mg/dL — AB (ref 8–23)
CO2: 28 mmol/L (ref 22–32)
Calcium: 9.4 mg/dL (ref 8.9–10.3)
Chloride: 101 mmol/L (ref 98–111)
Creatinine, Ser: 2.9 mg/dL — ABNORMAL HIGH (ref 0.44–1.00)
GFR calc Af Amer: 16 mL/min — ABNORMAL LOW (ref >60)
GFR calc non Af Amer: 14 mL/min — ABNORMAL LOW (ref >60)
Glucose, Bld: 323 mg/dL — ABNORMAL HIGH (ref 70–99)
Potassium: 4.7 mmol/L (ref 3.5–5.1)
Sodium: 138 mmol/L (ref 135–145)

## 2018-04-07 LAB — CBC
HEMATOCRIT: 27.6 % — AB (ref 36.0–46.0)
Hemoglobin: 8.2 g/dL — ABNORMAL LOW (ref 12.0–15.0)
MCH: 29.7 pg (ref 26.0–34.0)
MCHC: 29.7 g/dL — ABNORMAL LOW (ref 30.0–36.0)
MCV: 100 fL (ref 80.0–100.0)
NRBC: 0.2 % (ref 0.0–0.2)
Platelets: 166 10*3/uL (ref 150–400)
RBC: 2.76 MIL/uL — ABNORMAL LOW (ref 3.87–5.11)
RDW: 17.7 % — ABNORMAL HIGH (ref 11.5–15.5)
WBC: 8.8 10*3/uL (ref 4.0–10.5)

## 2018-04-07 LAB — FOLATE: Folate: 20.8 ng/mL (ref >5.9)

## 2018-04-07 LAB — VITAMIN B12: Vitamin B-12: 1032 pg/mL — ABNORMAL HIGH (ref 180–914)

## 2018-04-07 LAB — GLUCOSE, CAPILLARY
GLUCOSE-CAPILLARY: 131 mg/dL — AB (ref 70–99)
Glucose-Capillary: 310 mg/dL — ABNORMAL HIGH (ref 70–99)
Glucose-Capillary: 250 mg/dL — ABNORMAL HIGH (ref 70–99)
Glucose-Capillary: 125 mg/dL — ABNORMAL HIGH (ref 70–99)

## 2018-04-07 LAB — AMMONIA: Ammonia: 27 umol/L (ref 9–35)

## 2018-04-07 LAB — TSH: TSH: 2.353 u[IU]/mL (ref 0.350–4.500)

## 2018-04-07 LAB — MRSA PCR SCREENING: MRSA by PCR: POSITIVE — AB

## 2018-04-07 MED ORDER — ALBUMIN HUMAN 25 % IV SOLN
25.0000 g | Freq: Once | INTRAVENOUS | Status: AC
  Administered 2018-04-07: 25 g via INTRAVENOUS
  Filled 2018-04-07: qty 100

## 2018-04-07 MED ORDER — CHLORHEXIDINE GLUCONATE CLOTH 2 % EX PADS
6.0000 | MEDICATED_PAD | Freq: Every day | CUTANEOUS | Status: DC
  Administered 2018-04-08: 07:00:00 6 via TOPICAL

## 2018-04-07 MED ORDER — PENTAFLUOROPROP-TETRAFLUOROETH EX AERO
1.0000 "application " | INHALATION_SPRAY | CUTANEOUS | Status: DC | PRN
  Filled 2018-04-07: qty 30

## 2018-04-07 MED ORDER — LIDOCAINE-PRILOCAINE 2.5-2.5 % EX CREA
1.0000 "application " | TOPICAL_CREAM | CUTANEOUS | Status: DC | PRN
  Filled 2018-04-07: qty 5

## 2018-04-07 MED ORDER — SODIUM CHLORIDE 0.9 % IV SOLN: 100.0000 mL | INTRAVENOUS | Status: DC | PRN

## 2018-04-07 MED ORDER — ALTEPLASE 2 MG IJ SOLR: 2.0000 mg | Freq: Once | INTRAMUSCULAR | Status: DC | PRN

## 2018-04-07 MED ORDER — HEPARIN SODIUM (PORCINE) 1000 UNIT/ML DIALYSIS: 1000.0000 [IU] | INTRAMUSCULAR | Status: DC | PRN

## 2018-04-07 MED ORDER — LIDOCAINE HCL (PF) 1 % IJ SOLN
5.0000 mL | INTRAMUSCULAR | Status: DC | PRN
  Filled 2018-04-07: qty 5

## 2018-04-07 NOTE — Progress Notes (Signed)
[facility] [place] NAME: Amyra Vantuyl    MR#:  [id number]  DATE OF BIRTH:  [birth year]  SUBJECTIVE:   She admitted to the hospital secondary to altered mental status.  Mental status is at baseline now.  Patient's daughter was at bedside.  Pt. Missed her dialysis yesterday due to AMS and plan to get it today.   REVIEW OF SYSTEMS:    Review of Systems  Unable to perform ROS: Dementia    Nutrition: NPO only on tube feeds Tolerating Diet: Yes Tolerating PT: bedbound at baseline.  DRUG ALLERGIES:   Allergies  Allergen Reactions  . Reglan [Metoclopramide]     Pt's son reports this causes patient tremors and neurologic issues   . Contrast Media [Iodinated Diagnostic Agents]     Sensitivity, kidney issues    VITALS:  Blood pressure (!) 82/72, pulse 61, temperature 98.3 F (36.8 C), resp. rate 16, height 5\' 3"  (1.6 m), weight 73.8 kg, SpO2 100 %.  PHYSICAL EXAMINATION:   Physical Exam  GENERAL:  85 y.o.-year-old patient lying in bed yelling out at times in NAD.   EYES: Pupils equal, round, reactive to light and accommodation. No scleral icterus. Extraocular muscles intact.  HEENT: Head atraumatic, normocephalic. Oropharynx and nasopharynx clear.  NECK:  Supple, no jugular venous distention. No thyroid enlargement, no tenderness.  LUNGS: Normal breath sounds bilaterally, no wheezing, rales, rhonchi. No use of accessory muscles of respiration.  CARDIOVASCULAR: S1, S2 normal. No murmurs, rubs, or gallops. Right chest wall perm-cath in place.  ABDOMEN: Soft, nontender, nondistended. Bowel sounds present. No organomegaly or mass.  EXTREMITIES: No cyanosis, clubbing or edema b/l.    NEUROLOGIC: Cranial nerves II through XII are intact. No focal Motor or sensory deficits b/l. Globally weak and bedbound  PSYCHIATRIC: The patient is alert and oriented x 1.  SKIN: No obvious rash, lesion, or ulcer.    LABORATORY PANEL:   CBC Recent  Labs  Lab 04/07/18 0620  WBC 8.8  HGB 8.2*  HCT 27.6*  PLT 166   ------------------------------------------------------------------------------------------------------------------  Chemistries  Recent Labs  Lab 04/06/18 1727 04/07/18 0620  NA 137 138  K 4.7 4.7  CL 99 101  CO2 27 28  GLUCOSE 346* 323*  BUN 76* 79*  CREATININE 2.50* 2.90*  CALCIUM 9.6 9.4  AST 38  --   ALT 21  --   ALKPHOS 100  --   BILITOT 1.1  --    ------------------------------------------------------------------------------------------------------------------  Cardiac Enzymes Recent Labs  Lab 04/07/18 0620  TROPONINI 0.10*   ------------------------------------------------------------------------------------------------------------------  RADIOLOGY:  Dg Chest 1 View  Result Date: 04/06/2018 CLINICAL DATA:  Altered mental status EXAM: CHEST  1 VIEW COMPARISON:  03/30/2018 FINDINGS: Mild cardiomegaly with CABG markers and median sternotomy wires. Right IJ hemodialysis catheter tip projects over the right atrium. There are bilateral basilar predominant opacities with small pleural effusions. IMPRESSION: Cardiomegaly, small pleural effusions and mild pulmonary edema, compatible with congestive heart failure Electronically Signed   By: Ulyses Jarred M.D.   On: 04/06/2018 18:37     ASSESSMENT AND PLAN:   85 year old female with past medical history of diabetes, previous CVA, hypertension, ESRD on hemodialysis, coronary artery disease, dementia, adult failure to thrive, achalasia, chronic tube feedings who presented to the hospital due to altered mental status.  * Altered mental status- etiology unclear but currently her mental status is back to baseline.  Metabolic work-up so far has been negative.  Patient's ammonia,  TSH are normal. -No acute infectious source.  * Acute hypoxic respiratory failure- likely due to pulmonary edema.   -Plan for hemodialysis today and will wean off oxygen as  tolerated.  * Elevated troponin-this is secondary to the ESRD and poor renal clearance.   - Troponins have not trended upwards.  We will continue to monitor.  * ESRD on HD MWF-she missed her hemodialysis yesterday due to altered mental status.  Plan for dialysis later today once her blood pressure improved.  * Chronic combined congestive heart failure- plan for HD today for fluid removal.   * History of dysphasia secondary to achalasia- s/p Botox injections in the past.  Patient has PEG tube and cont. Tube feedings.   * Type 2 diabetes-cont. SSI and follow BS.   * Paroxysmal atrial fibrillation-rate controlled.  Patient not on any anticoagulation at home.   *Hypotension-chronic for the patient.  Continue midodrine.  * History of stroke-continue Plavix and statin  * Anemia of chronic kidney disease- hemoglobin at baseline.  EPO with dialysis.  Possible d/c home tomorrow after tolerated HD today.    All the records are reviewed and case discussed with Care Management/Social Worker. Management plans discussed with the patient, family and they are in agreement.  CODE STATUS: full code  DVT Prophylaxis: Hep SQ  TOTAL TIME TAKING CARE OF THIS PATIENT: 30 minutes.   POSSIBLE D/C IN 1-2 DAYS, DEPENDING ON CLINICAL CONDITION.   Henreitta Leber M.D on 04/07/2018 at 12:33 PM  Between 7am to 6pm - Pager - [phone]  After 6pm go to [email or web address] - Proofreader  [facility]  Office  [phone]  CC: Primary care physician; Deland Pretty, MD

## 2018-04-07 NOTE — Progress Notes (Signed)
[facility]  ROUNDING NOTE   Subjective:  Patient readmitted to the hospital given inability to safely check blood pressure during outpatient dialysis treatment. Patient resting in bed but yelling out at times.  Objective:  Vital signs in last 24 hours:  Temp:  [97.7 F (36.5 C)-98.4 F (36.9 C)] 98.3 F (36.8 C) (03/07 0416) Pulse Rate:  [61-120] 61 (03/07 0904) Resp:  [11-23] 16 (03/07 0416) BP: (82-136)/(58-108) 82/72 (03/07 0904) SpO2:  [95 %-100 %] 100 % (03/07 0416) Weight:  [73.8 kg-77 kg] 73.8 kg (03/06 2245)  Weight change:  Filed Weights   04/06/18 1724 04/06/18 2245  Weight: 77 kg 73.8 kg    Intake/Output: No intake/output data recorded.   Intake/Output this shift:  No intake/output data recorded.  Physical Exam: General:  No acute distress, laying in the bed  Head:  Temporal muscle wasting  Eyes:  Anicteric  Neck:  Supple  Lungs:   clear  Heart:  Irregular rhythm  Abdomen:   Soft, nontender, +G-tube  Extremities:  +++ anasarca  Neurologic:  Awake, yells out on occassion  Access:  RIJ permcath, PD catheter    Basic Metabolic Panel: Recent Labs  Lab 04/01/18 1339 04/06/18 1727 04/07/18 0620  NA 135 137 138  K 4.8 4.7 4.7  CL 99 99 101  CO2 25 27 28   GLUCOSE 245* 346* 323*  BUN 72* 76* 79*  CREATININE 2.07* 2.50* 2.90*  CALCIUM 9.0 9.6 9.4    Liver Function Tests: Recent Labs  Lab 04/01/18 1339 04/06/18 1727  AST 58* 38  ALT 31 21  ALKPHOS 98 100  BILITOT 0.9 1.1  PROT 5.9* 6.6  ALBUMIN 1.8* 2.3*   No results for input(s): LIPASE, AMYLASE in the last 168 hours. Recent Labs  Lab 04/06/18 2334  AMMONIA 27    CBC: Recent Labs  Lab 04/06/18 1727 04/07/18 0620  WBC 8.6 8.8  NEUTROABS 6.5  --   HGB 8.6* 8.2*  HCT 29.2* 27.6*  MCV 101.0* 100.0  PLT 196 166    Cardiac Enzymes: Recent Labs  Lab 04/06/18 1727 04/06/18 2334 04/07/18 0620  TROPONINI 0.14* 0.13* 0.10*    BNP: Invalid input(s):  POCBNP  CBG: Recent Labs  Lab 04/03/18 0124 04/03/18 0419 04/03/18 0751 04/06/18 2320 04/07/18 0744  GLUCAP 258* 186* 214* 285* 310*    Microbiology: Results for orders placed or performed during the hospital encounter of 04/06/18  MRSA PCR Screening     Status: Abnormal   Collection Time: 04/06/18 11:31 PM  Result Value Ref Range Status   MRSA by PCR POSITIVE (A) NEGATIVE Final    Comment:        The GeneXpert MRSA Assay (FDA approved for NASAL specimens only), is one component of a comprehensive MRSA colonization surveillance program. It is not intended to diagnose MRSA infection nor to guide or monitor treatment for MRSA infections. CRITICAL RESULT CALLED TO, READ BACK BY AND VERIFIED WITH: PHYLLIS KING @108  04/07/2018 Performed at [facility], [address] [place], [place] [address]     Coagulation Studies: No results for input(s): LABPROT, INR in the last 72 hours.  Urinalysis: No results for input(s): COLORURINE, LABSPEC, PHURINE, GLUCOSEU, HGBUR, BILIRUBINUR, KETONESUR, PROTEINUR, UROBILINOGEN, NITRITE, LEUKOCYTESUR in the last 72 hours.  Invalid input(s): APPERANCEUR    Imaging: Dg Chest 1 View  Result Date: 04/06/2018 CLINICAL DATA:  Altered mental status EXAM: CHEST  1 VIEW COMPARISON:  03/30/2018 FINDINGS: Mild cardiomegaly with CABG markers and median sternotomy  wires. Right IJ hemodialysis catheter tip projects over the right atrium. There are bilateral basilar predominant opacities with small pleural effusions. IMPRESSION: Cardiomegaly, small pleural effusions and mild pulmonary edema, compatible with congestive heart failure Electronically Signed   By: [name] M.D.   On: 04/06/2018 18:37     Medications:   . albumin human    . feeding supplement (OSMOLITE 1.5 CAL) 1,000 mL (04/07/18 0115)   . atorvastatin  40 mg Per Tube q1800  . B-complex with vitamin C  1 tablet Per Tube Daily  . Chlorhexidine Gluconate Cloth  6 each Topical  Q0600  . clopidogrel  75 mg Per Tube Daily  . famotidine  10 mg Per Tube Daily  . feeding supplement (PRO-STAT SUGAR FREE 64)  30 mL Per Tube BID  . free water  30 mL Per Tube Q8H  . heparin  5,000 Units Subcutaneous Q8H  . insulin aspart  0-5 Units Subcutaneous QHS  . insulin aspart  0-9 Units Subcutaneous TID WC  . midodrine  5 mg Per Tube TID WC  . multivitamin  15 mL Per Tube Daily  . QUEtiapine  50 mg Per Tube QHS   acetaminophen, albuterol, ipratropium, Melatonin, polyethylene glycol, promethazine  Assessment/ Plan:  [name] is a 85 y.o. black female with diabetes mellitus type II, hypertension, hyperlipidemia, coronary disease status post CABG, history of stroke, CKD, was admitted on1/27/2020. Originally admitted to [place] hospitalwith Achalasiaand 50 pound weight loss, dysarthria since November 2019. Now transitioned to [facility] on 02/26/18 to initiate hemodialysis. First hemodialysis treatment on 1/28 through [facility].  PD catheter placed on 03/29/18  1. End Stage Renal Disease with generalized edema   No signs of renal recovery. Anuric Hemodialysis last on Friday.  The tentative plan is for the patient to continue hemodialysis for the next several weeks and then transition the patient to peritoneal dialysis once PD catheter is matured.   [facility] [place] [facility]  We have written for albumin for blood pressure support.  2. Hypotension: -Continue the patient on midodrine.  3. Anemia of chronic kidney disease:   -Hemoglobin 8.2.  Restart the patient on Epogen on Monday.  4. Malnutrition with achalasia: Albumin up to 2.3.  Maintain the patient on tube feeds.   LOS: 0 [name] 3/7/202011:19 AM

## 2018-04-08 LAB — GLUCOSE, CAPILLARY
GLUCOSE-CAPILLARY: 198 mg/dL — AB (ref 70–99)
Glucose-Capillary: 258 mg/dL — ABNORMAL HIGH (ref 70–99)
Glucose-Capillary: 242 mg/dL — ABNORMAL HIGH (ref 70–99)

## 2018-04-08 NOTE — Care Management Obs Status (Signed)
[facility] NOTIFICATION   Patient Details  Name: [name] MRN: [id number] Date of Birth: 03-Aug-1927   Medicare Observation Status Notification Given:  Yes    Vannia Pola A Develle Sievers, RN 04/08/2018, 12:00 PM

## 2018-04-08 NOTE — Progress Notes (Signed)
[facility]  ROUNDING NOTE   Subjective:  Patient still yelling out quite frequently. She did undergo hemodialysis yesterday with ultrafiltration achieved of 0.5 kg. Patient also received albumin yesterday.  Objective:  Vital signs in last 24 hours:  Temp:  [97.4 F (36.3 C)-98.5 F (36.9 C)] 97.7 F (36.5 C) (03/08 0503) Pulse Rate:  [49-99] 99 (03/08 0503) Resp:  [14-21] 14 (03/08 0503) BP: (92-141)/(44-111) 106/67 (03/08 0503) SpO2:  [100 %] 100 % (03/08 0503) Weight:  [72.6 kg-75 kg] 72.6 kg (03/07 1711)  Weight change: -2 kg Filed Weights   04/06/18 2245 04/07/18 1315 04/07/18 1711  Weight: 73.8 kg 75 kg 72.6 kg    Intake/Output: I/O last 3 completed shifts: In: 9381 [P.O.:1752] Out: 500 [Other:500]   Intake/Output this shift:  No intake/output data recorded.  Physical Exam: General:  No acute distress, laying in the bed  Head:  Temporal muscle wasting  Eyes:  Anicteric  Neck:  Supple  Lungs:   clear bilateral  Heart:  Irregular rhythm  Abdomen:   Soft, nontender, +G-tube  Extremities:  ++ anasarca  Neurologic:  Awake, yells out on occassion  Access:  RIJ permcath, PD catheter    Basic Metabolic Panel: Recent Labs  Lab 04/01/18 1339 04/06/18 1727 04/07/18 0620  NA 135 137 138  K 4.8 4.7 4.7  CL 99 99 101  CO2 25 27 28   GLUCOSE 245* 346* 323*  BUN 72* 76* 79*  CREATININE 2.07* 2.50* 2.90*  CALCIUM 9.0 9.6 9.4    Liver Function Tests: Recent Labs  Lab 04/01/18 1339 04/06/18 1727  AST 58* 38  ALT 31 21  ALKPHOS 98 100  BILITOT 0.9 1.1  PROT 5.9* 6.6  ALBUMIN 1.8* 2.3*   No results for input(s): LIPASE, AMYLASE in the last 168 hours. Recent Labs  Lab 04/06/18 2334  AMMONIA 27    CBC: Recent Labs  Lab 04/06/18 1727 04/07/18 0620  WBC 8.6 8.8  NEUTROABS 6.5  --   HGB 8.6* 8.2*  HCT 29.2* 27.6*  MCV 101.0* 100.0  PLT 196 166    Cardiac Enzymes: Recent Labs  Lab 04/06/18 1727 04/06/18 2334 04/07/18 0620   TROPONINI 0.14* 0.13* 0.10*    BNP: Invalid input(s): POCBNP  CBG: Recent Labs  Lab 04/07/18 0744 04/07/18 1137 04/07/18 1737 04/07/18 2111 04/08/18 0735  GLUCAP 310* 250* 125* 131* 198*    Microbiology: Results for orders placed or performed during the hospital encounter of 04/06/18  MRSA PCR Screening     Status: Abnormal   Collection Time: 04/06/18 11:31 PM  Result Value Ref Range Status   MRSA by PCR POSITIVE (A) NEGATIVE Final    Comment:        The GeneXpert MRSA Assay (FDA approved for NASAL specimens only), is one component of a comprehensive MRSA colonization surveillance program. It is not intended to diagnose MRSA infection nor to guide or monitor treatment for MRSA infections. CRITICAL RESULT CALLED TO, READ BACK BY AND VERIFIED WITH: PHYLLIS KING @108  04/07/2018 Performed at [facility], [address] [place], [place] [address]     Coagulation Studies: No results for input(s): LABPROT, INR in the last 72 hours.  Urinalysis: No results for input(s): COLORURINE, LABSPEC, PHURINE, GLUCOSEU, HGBUR, BILIRUBINUR, KETONESUR, PROTEINUR, UROBILINOGEN, NITRITE, LEUKOCYTESUR in the last 72 hours.  Invalid input(s): APPERANCEUR    Imaging: Dg Chest 1 View  Result Date: 04/06/2018 CLINICAL DATA:  Altered mental status EXAM: CHEST  1 VIEW COMPARISON:  03/30/2018  FINDINGS: Mild cardiomegaly with CABG markers and median sternotomy wires. Right IJ hemodialysis catheter tip projects over the right atrium. There are bilateral basilar predominant opacities with small pleural effusions. IMPRESSION: Cardiomegaly, small pleural effusions and mild pulmonary edema, compatible with congestive heart failure Electronically Signed   By: Ulyses Jarred M.D.   On: 04/06/2018 18:37     Medications:   . feeding supplement (OSMOLITE 1.5 CAL) 1,000 mL (04/07/18 0115)   . atorvastatin  40 mg Per Tube q1800  . B-complex with vitamin C  1 tablet Per Tube Daily  .  Chlorhexidine Gluconate Cloth  6 each Topical Q0600  . clopidogrel  75 mg Per Tube Daily  . famotidine  10 mg Per Tube Daily  . feeding supplement (PRO-STAT SUGAR FREE 64)  30 mL Per Tube BID  . free water  30 mL Per Tube Q8H  . heparin  5,000 Units Subcutaneous Q8H  . insulin aspart  0-5 Units Subcutaneous QHS  . insulin aspart  0-9 Units Subcutaneous TID WC  . midodrine  5 mg Per Tube TID WC  . multivitamin  15 mL Per Tube Daily  . QUEtiapine  50 mg Per Tube QHS   acetaminophen, albuterol, ipratropium, Melatonin, polyethylene glycol, promethazine  Assessment/ Plan:  [name] is a 85 y.o. black female with diabetes mellitus type II, hypertension, hyperlipidemia, coronary disease status post CABG, history of stroke, CKD, was admitted on1/27/2020. Originally admitted to [place] hospitalwith Achalasiaand 50 pound weight loss, dysarthria since November 2019. Now transitioned to [facility] on 02/26/18 to initiate hemodialysis. First hemodialysis treatment on 1/28 through [facility].  PD catheter placed on 03/29/18  1. End Stage Renal Disease with generalized edema   No signs of renal recovery. Anuric Hemodialysis last on Friday.  The tentative plan is for the patient to continue hemodialysis for the next several weeks and then transition the patient to peritoneal dialysis once PD catheter is matured.   [facility] [place] [place]  We will plan for dialysis again tomorrow as an outpatient.  2. Hypotension: -We plan to maintain the patient on midodrine.  3. Anemia of chronic kidney disease:   -Continue Epogen as an outpatient.  4. Malnutrition with achalasia: [place] on PEG tube was clogged but now unclogged.  Maintain the patient on tube feeds.   LOS: 0 [name] 3/8/202012:13 PM

## 2018-04-08 NOTE — Discharge Summary (Signed)
[facility] NAME: [name]    MR#:  [id number]  DATE OF BIRTH:  09/07/1927  DATE OF ADMISSION:  04/06/2018 ADMITTING PHYSICIAN: Sela Hua, MD  DATE OF DISCHARGE: 04/08/2018  PRIMARY CARE PHYSICIAN: Deland Pretty, MD    ADMISSION DIAGNOSIS:  Encephalopathy [G93.40] End stage renal disease on dialysis ([place]) [N18.6, Z99.2]  DISCHARGE DIAGNOSIS:  Active Problems:   AMS (altered mental status)   SECONDARY DIAGNOSIS:   Past Medical History:  Diagnosis Date  . Acute renal injury ([place]) 07/2016  . Anginal pain ([place]) 2004; 2011  . CAD (coronary artery disease)    status post stenting of the RCA status post CABG. (left internal mammary artery to  LAD, saphenous vein graft to second diagonal, saphenous vein  graft to obtuse marginal 1, saphenous vein graft to posterior  descending).  . Cardiomyopathy     Mildly reduced EF (45%).   . Chronic kidney disease (CKD), stage III (moderate) ([place])   . History of blood transfusion 1960s   "related to anemia"  . HTN (hypertension)   . Pleural effusion 2011   S/P "bypass"  . Shortness of breath   . Stroke ([place]) 09/19/2016   "left sided weakness" (09/20/2016)  . Type II diabetes mellitus [facility])     HOSPITAL COURSE:   85 year old female with past medical history of diabetes, previous CVA, hypertension, ESRD on hemodialysis, coronary artery disease, dementia, adult failure to thrive, achalasia, chronic tube feedings who presented to the hospital due to altered mental status.  * Altered mental status-  this was likely secondary to underlying dementia.  Mental status was no worse than baseline.  Metabolic and infectious work-up while in the hospital has been negative.  Patient is back to baseline now.  * Acute hypoxic respiratory failure- likelydue to pulmonary edema.  -Status post hemodialysis and now weaned off oxygen and is currently on room air.  Much improved.  * Elevated  troponin-this is secondary to the ESRD and poor renal clearance.   - Troponins did not trend upwards.   * ESRDon HD MWF-patient missed her dialysis on Friday due to altered mental status and had dialysis yesterday.  She will continue her dialysis on schedule as mentioned above.  * Chronic combined congestive heart failure- clinically not in CHF and had HD yesterday prior to discharge.  * History of dysphasia secondary to achalasia- s/p Botox injections in the past. Patient has PEG tube and cont. Tube feedings.   * Type 2 diabetes- pt. Will cont. SSI.   * Paroxysmal atrial fibrillation-rate controlled. Patient not on any anticoagulation at home.   *Hypotension-chronic for the patient.  She will Continue midodrine.  * History of stroke- she will continue Plavix and statin  * Anemia of chronic kidney disease- hemoglobin at baseline. cont. EPO with dialysis.  DISCHARGE CONDITIONS:   Stable  CONSULTS OBTAINED:    DRUG ALLERGIES:   Allergies  Allergen Reactions  . Reglan [Metoclopramide]     Pt's son reports this causes patient tremors and neurologic issues   . Contrast Media [Iodinated Diagnostic Agents]     Sensitivity, kidney issues    DISCHARGE MEDICATIONS:   Allergies as of 04/08/2018      Reactions   Reglan [metoclopramide]    Pt's son reports this causes patient tremors and neurologic issues    Contrast Media [iodinated Diagnostic Agents]    Sensitivity, kidney issues      Medication List  TAKE these medications   acetaminophen 160 MG/5ML solution Commonly known as:  TYLENOL Place 15.6 mLs (500 mg total) into feeding tube every 6 (six) hours as needed for mild pain or fever.   albuterol (2.5 MG/3ML) 0.083% nebulizer solution Commonly known as:  PROVENTIL Take 3 mLs (2.5 mg total) by nebulization every 4 (four) hours as needed for wheezing or shortness of breath.   atorvastatin 40 MG tablet Commonly known as:  Lipitor Place 1 tablet (40 mg total)  into feeding tube daily at 6 PM.   B-complex with vitamin C tablet Place 1 tablet into feeding tube daily.   chlorhexidine 0.12 % solution Commonly known as:  PERIDEX 15 mLs by Mouth Rinse route 2 (two) times daily.   cholecalciferol 10 MCG (400 UNIT) Tabs tablet Commonly known as:  VITAMIN D3 Place 1 tablet (400 Units total) into feeding tube daily.   clopidogrel 75 MG tablet Commonly known as:  PLAVIX Place 1 tablet (75 mg total) into feeding tube daily.   collagenase ointment Commonly known as:  SANTYL Apply topically daily.   famotidine 10 MG tablet Commonly known as:  PEPCID Place 1 tablet (10 mg total) into feeding tube daily.   feeding supplement (PRO-STAT SUGAR FREE 64) Liqd Place 30 mLs into feeding tube 2 (two) times daily.   free water Soln Place 30 mLs into feeding tube every 8 (eight) hours.   insulin aspart 100 UNIT/ML injection Commonly known as:  novoLOG Inject 0-9 Units into the skin every 4 (four) hours. CBG 70 - 120: 0 units CBG 121 - 150: 1 unit CBG 151 - 200: 2 units CBG 201 - 250: 3 units CBG 251 - 300: 5 units CBG 301 - 350: 7 units CBG 351 - 400: 9 units   ipratropium 0.02 % nebulizer solution Commonly known as:  ATROVENT Take 2.5 mLs (0.5 mg total) by nebulization every 6 (six) hours as needed for wheezing or shortness of breath.   Melatonin 3 MG Tabs Take 1 tablet (3 mg total) by mouth at bedtime as needed (insomnia).   midodrine 5 MG tablet Commonly known as:  PROAMATINE Place 1 tablet (5 mg total) into feeding tube 3 (three) times daily with meals.   mouth rinse Liqd solution 15 mLs by Mouth Rinse route 2 times daily at 12 noon and 4 pm.   multivitamin Liqd Place 15 mLs into feeding tube daily.   nitroGLYCERIN 0.4 MG SL tablet Commonly known as:  NITROSTAT Place 1 tablet (0.4 mg total) under the tongue every 5 (five) minutes x 3 doses as needed for chest pain.   nutrition supplement (JUVEN) Pack Place 1 packet into feeding  tube 2 (two) times daily between meals.   feeding supplement (OSMOLITE 1.5 CAL) Liqd Place 1,000 mLs into feeding tube continuous. 40 ml/hour continuous.   polyethylene glycol packet Commonly known as:  MIRALAX / GLYCOLAX Take 17 g by mouth daily as needed for mild constipation.   promethazine 6.25 MG/5ML syrup Commonly known as:  PHENERGAN Place 5 mLs (6.25 mg total) into feeding tube 4 (four) times daily as needed for nausea or vomiting.         DISCHARGE INSTRUCTIONS:   DIET:  NPO on tube feeds  DISCHARGE CONDITION:  Stable  ACTIVITY:  Activity as tolerated  OXYGEN:  Home Oxygen: No.   Oxygen Delivery: room air  DISCHARGE LOCATION:  home   If you experience worsening of your admission symptoms, develop shortness of breath, life threatening emergency, suicidal  or homicidal thoughts you must seek medical attention immediately by calling 911 or calling your MD immediately  if symptoms less severe.  You Must read complete instructions/literature along with all the possible adverse reactions/side effects for all the Medicines you take and that have been prescribed to you. Take any new Medicines after you have completely understood and accpet all the possible adverse reactions/side effects.   Please note  You were cared for by a hospitalist during your hospital stay. If you have any questions about your discharge medications or the care you received while you were in the hospital after you are discharged, you can call the unit and asked to speak with the hospitalist on call if the hospitalist that took care of you is not available. Once you are discharged, your primary care physician will handle any further medical issues. Please note that NO REFILLS for any discharge medications will be authorized once you are discharged, as it is imperative that you return to your primary care physician (or establish a relationship with a primary care physician if you do not have one) for  your aftercare needs so that they can reassess your need for medications and monitor your lab values.     Today   No acute events overnight.  Mental status at baseline.  Had dialysis yesterday and tolerated it.  Blood pressure stable today.  VITAL SIGNS:  Blood pressure 106/67, pulse 99, temperature 97.7 F (36.5 C), temperature source Oral, resp. rate 14, height 5\' 3"  (1.6 m), weight 72.6 kg, SpO2 100 %.  I/O:    Intake/Output Summary (Last 24 hours) at 04/08/2018 1139 Last data filed at 04/07/2018 1711 Gross per 24 hour  Intake 1752 ml  Output 500 ml  Net 1252 ml    PHYSICAL EXAMINATION:   GENERAL:  85 y.o.-year-old patient lying in bed yelling out at times in NAD.   EYES: Pupils equal, round, reactive to light. No scleral icterus. Extraocular muscles intact.  HEENT: Head atraumatic, normocephalic. Oropharynx and nasopharynx clear.  NECK:  Supple, no jugular venous distention. No thyroid enlargement, no tenderness.  LUNGS: Poor Resp. effort, no wheezing, rales, rhonchi. No use of accessory muscles of respiration.  CARDIOVASCULAR: S1, S2 normal. No murmurs, rubs, or gallops. Right chest wall perm-cath in place.  ABDOMEN: Soft, nontender, nondistended. Bowel sounds present. No organomegaly or mass.  EXTREMITIES: No cyanosis, clubbing or edema b/l.    NEUROLOGIC: Cranial nerves II through XII are intact. No focal Motor or sensory deficits b/l. Globally weak and bedbound  PSYCHIATRIC: The patient is alert and oriented x 1.  SKIN: No obvious rash, lesion, or ulcer.   DATA REVIEW:   CBC Recent Labs  Lab 04/07/18 0620  WBC 8.8  HGB 8.2*  HCT 27.6*  PLT 166    Chemistries  Recent Labs  Lab 04/06/18 1727 04/07/18 0620  NA 137 138  K 4.7 4.7  CL 99 101  CO2 27 28  GLUCOSE 346* 323*  BUN 76* 79*  CREATININE 2.50* 2.90*  CALCIUM 9.6 9.4  AST 38  --   ALT 21  --   ALKPHOS 100  --   BILITOT 1.1  --     Cardiac Enzymes Recent Labs  Lab 04/07/18 0620  TROPONINI  0.10*    Microbiology Results  Results for orders placed or performed during the hospital encounter of 04/06/18  MRSA PCR Screening     Status: Abnormal   Collection Time: 04/06/18 11:31 PM  Result Value Ref Range  Status   MRSA by PCR POSITIVE (A) NEGATIVE Final    Comment:        The GeneXpert MRSA Assay (FDA approved for NASAL specimens only), is one component of a comprehensive MRSA colonization surveillance program. It is not intended to diagnose MRSA infection nor to guide or monitor treatment for MRSA infections. CRITICAL RESULT CALLED TO, READ BACK BY AND VERIFIED WITH: PHYLLIS KING @108  04/07/2018 Performed at [facility], [address] [place], [place] [address]     RADIOLOGY:  Dg Chest 1 View  Result Date: 04/06/2018 CLINICAL DATA:  Altered mental status EXAM: CHEST  1 VIEW COMPARISON:  03/30/2018 FINDINGS: Mild cardiomegaly with CABG markers and median sternotomy wires. Right IJ hemodialysis catheter tip projects over the right atrium. There are bilateral basilar predominant opacities with small pleural effusions. IMPRESSION: Cardiomegaly, small pleural effusions and mild pulmonary edema, compatible with congestive heart failure Electronically Signed   By: Ulyses Jarred M.D.   On: 04/06/2018 18:37      Management plans discussed with the patient, family and they are in agreement.  CODE STATUS:     Code Status Orders  (From admission, onward)         Start     Ordered   04/06/18 2305  Full code  Continuous     04/06/18 2304        TOTAL TIME TAKING CARE OF THIS PATIENT: 40 minutes.    Henreitta Leber M.D on 04/08/2018 at 11:39 AM  Between 7am to 6pm - Pager - [phone]  After 6pm go to [email or web address] - Proofreader  [facility]  Office  [phone]  CC: Primary care physician; Deland Pretty, MD

## 2018-04-08 NOTE — Progress Notes (Signed)
Pt being discharged home, discharge instructions placed in packet for son, EMS called for transport, daughter at bedside, pt with no complaints, no distress or discomfort noted

## 2018-04-08 NOTE — Progress Notes (Signed)
This evening spoke to the daughter who was at bedside and spoke to son who I spoke to on the dtr's cell phone. The son explained that patient is NOT to have seroquel or Heparin SQ at for she has a severe reaction to the seroquel and the Heparin SQ will cause her to bleed profusely. Also the son adamantly explained that the stop cock to the peg tube is to be used permanently for if not the entire peg tube will have to be replaced and he does not want to put her through that.. Prior to change of shift, previous nurse explained the stop cock was clogged and had difficulty unclogging it to be able to use. Explained how difficult it was to unclog to the son and son was very upset that it was not being used or that it was replaced with a new one. Stated to son will check with our ICU unit to see if we can replace it with a new one. Spoke to charge Nurse of ICU and she explained that the hospital has stopped using the stop cocks and therefore are not available. Advised by ICU charge Nurse to notify night [facility] to make her aware. In the meantime, was able to finally unclog the stop cock and is currently in use and is working properly with the feeding flowing per order. Patient given PRN meds per request of the son to help with the yelling of patient through the night. Those meds were PRN melatonin, Tylenol, and phenergan. Patient tolerated meds but continues to yell out very frequently. Will notify oncoming nurse of specific request and continue to monitor patient to end of shift.

## 2018-04-08 NOTE — Progress Notes (Signed)
Changed drainage tubing around peg tube and around the nephrostomy tube after cleansing the skin around the tube. Minimal drainage noted to both sites. Will continue to monitor patient to end of shift.

## 2018-04-08 NOTE — Progress Notes (Signed)
EMS here at this time to transport pt, son aware and is waiting for pt to arrive

## 2018-04-08 NOTE — Plan of Care (Signed)
  Problem: Education: Goal: Knowledge of General Education information will improve Description Including pain rating scale, medication(s)/side effects and non-pharmacologic comfort measures Outcome: Progressing   Problem: Health Behavior/Discharge Planning: Goal: Ability to manage health-related needs will improve Outcome: Progressing   Problem: Clinical Measurements: Goal: Ability to maintain clinical measurements within normal limits will improve Outcome: Progressing Goal: Will remain free from infection Outcome: Progressing Goal: Diagnostic test results will improve Outcome: Progressing   Problem: Activity: Goal: Risk for activity intolerance will decrease Outcome: Progressing   Problem: Nutrition: Goal: Adequate nutrition will be maintained Outcome: Progressing   Problem: Coping: Goal: Level of anxiety will decrease Outcome: Progressing   Problem: Elimination: Goal: Will not experience complications related to bowel motility Outcome: Progressing   Problem: Pain Managment: Goal: General experience of comfort will improve Outcome: Progressing   Problem: Safety: Goal: Ability to remain free from injury will improve Outcome: Progressing   Problem: Skin Integrity: Goal: Risk for impaired skin integrity will decrease Outcome: Progressing   

## 2018-04-09 ENCOUNTER — Ambulatory Visit (INDEPENDENT_AMBULATORY_CARE_PROVIDER_SITE_OTHER): Payer: Medicare Other | Admitting: Nurse Practitioner

## 2018-04-09 ENCOUNTER — Telehealth (INDEPENDENT_AMBULATORY_CARE_PROVIDER_SITE_OTHER): Payer: Self-pay | Admitting: Nurse Practitioner

## 2018-04-09 NOTE — Telephone Encounter (Signed)
Patient son called nurse line and left message stating his mama missed the apt today bc she was being discharged from hospital. Requesting someone to call back to reschedule apt with our office. AS, CMA

## 2018-04-10 LAB — HIV ANTIBODY (ROUTINE TESTING W REFLEX): HIV Screen 4th Generation wRfx: NONREACTIVE

## 2018-04-10 LAB — RPR: RPR Ser Ql: NONREACTIVE

## 2018-04-13 NOTE — Telephone Encounter (Signed)
Called and patient son stated that she has passed

## 2018-05-02 DEATH — deceased

## 2019-07-09 IMAGING — US US PELVIS COMPLETE TRANSABD/TRANSVAG
1 series · 13 of 25 positions shown · non-contrast
Comparison: None

CLINICAL DATA: Initial evaluation for postmenopausal bleeding for 1
month.



[Series 1: us pelvis complete transabd/transvag · 0.23mm/px · 65 acquisitions, 13 frames shown]
[im 1/65]
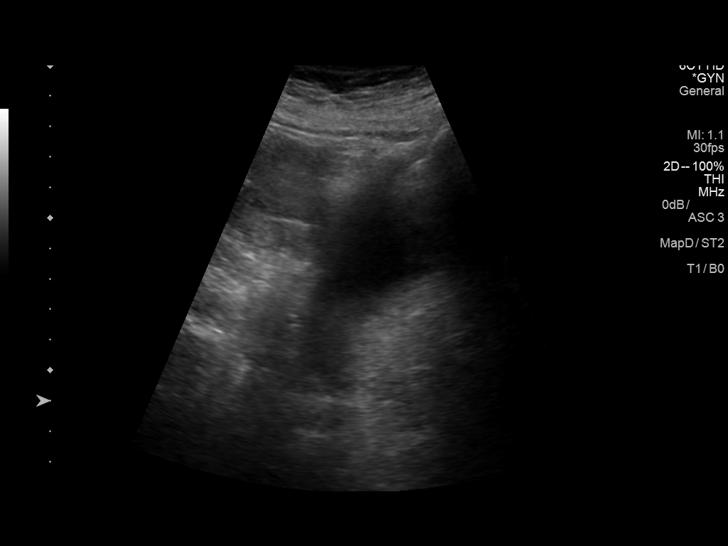
[im 6/65]
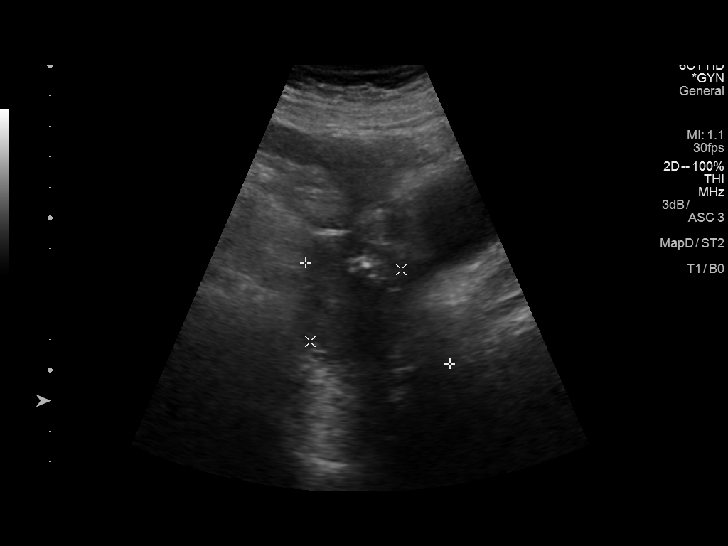
[im 11/65]
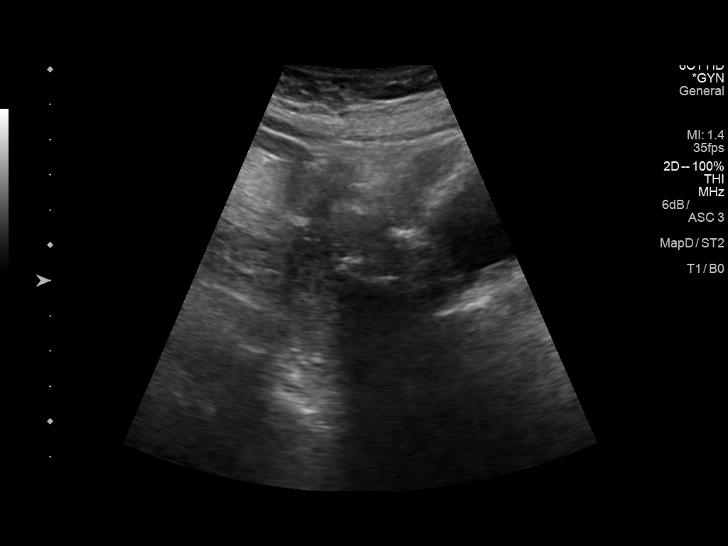
[im 17/65]
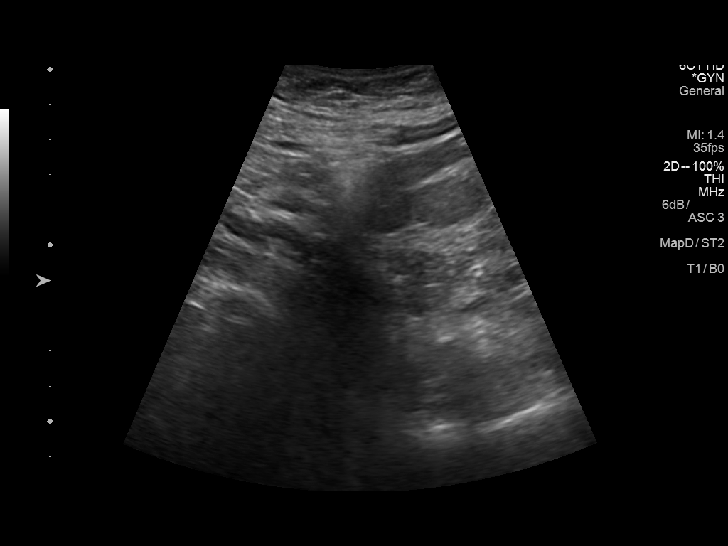
[im 22/65]
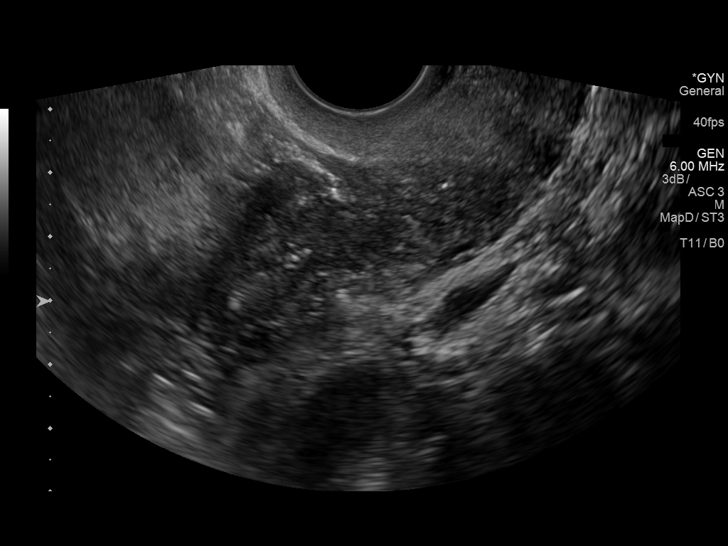
[im 27/65]
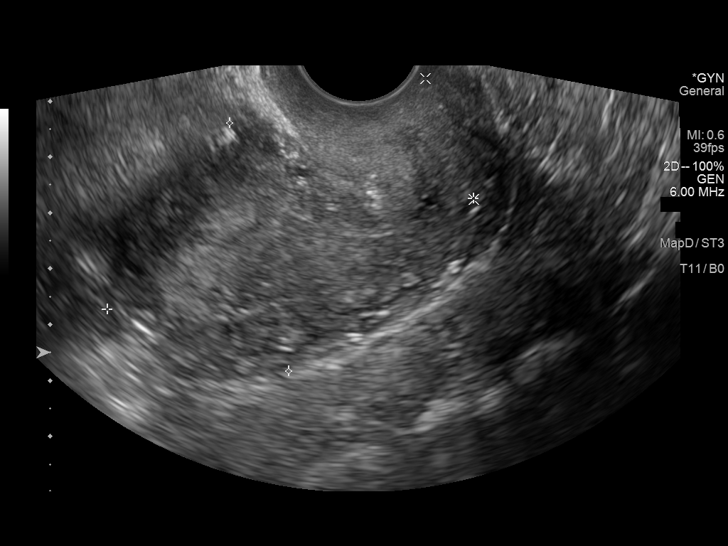
[im 33/65]
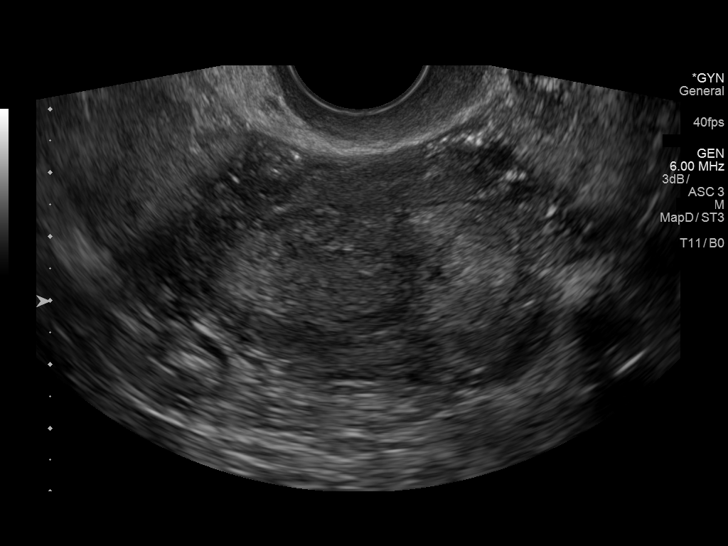
[im 38/65]
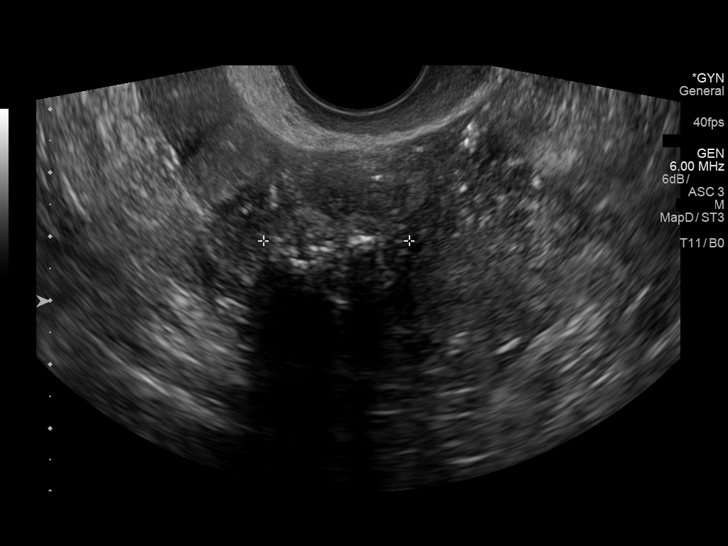
[im 43/65]
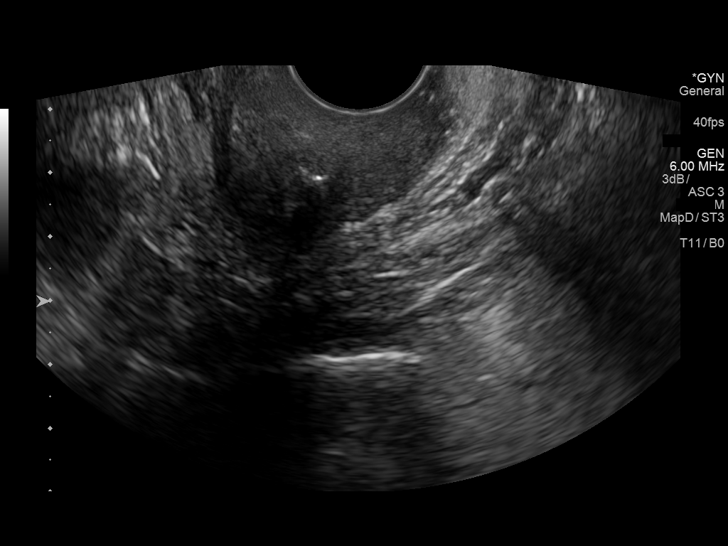
[im 49/65]
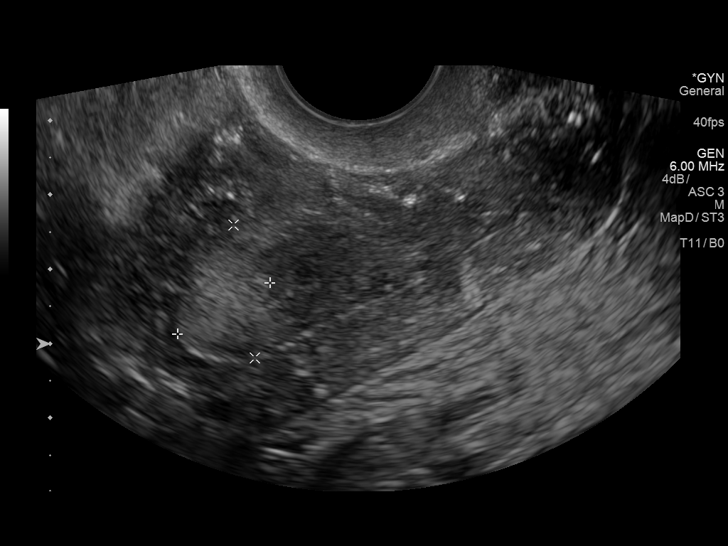
[im 54/65]
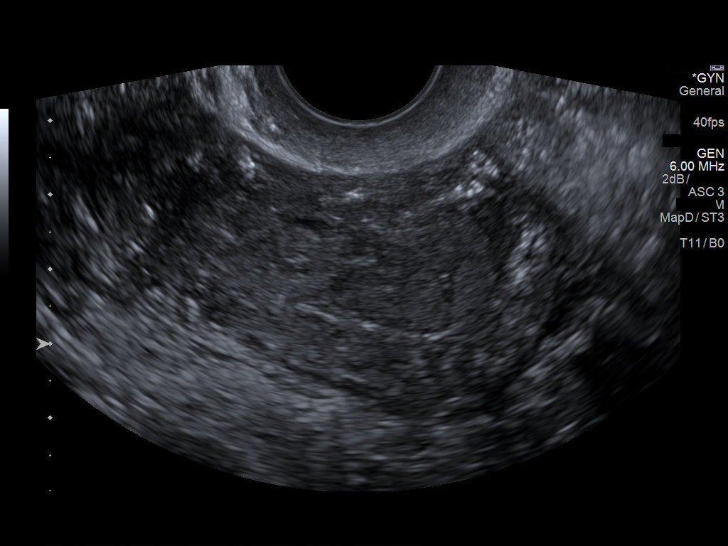
[im 59/65]
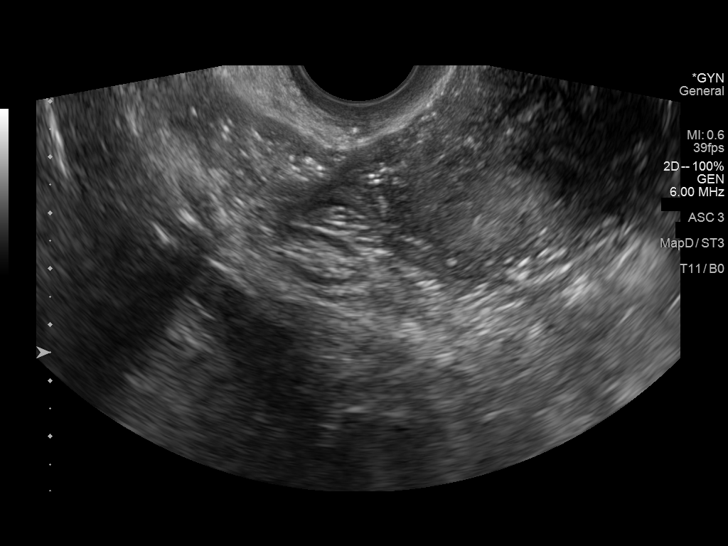
[im 65/65]
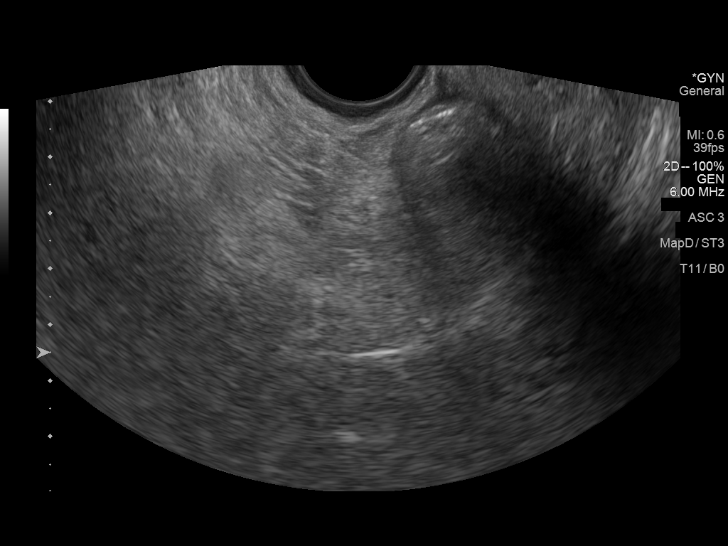

[13 of 25 positions shown; findings below may reference images not displayed]

FINDINGS: Uterus

Measurements: 9.2 x 4.6 x 7.0 cm. 1.5 x 1.4 x 2.3 cm intramural
fibroid present at the anterior uterine fundus. This is partially
calcified with posterior acoustic shadowing. Additional tiny 4 mm
calcified intramural fibroid noted within the adjacent fundus.

Endometrium

Endometrial cavity is filled with heterogeneous hypoechoic material
with scattered low-level echoes. Surrounding endometrial wall itself
somewhat thin measuring 1.5 mm. There is a superimposed more focal
complex mass-like area at the uterine fundus measuring 2.5 x 1.4 x
1.8 cm. Internal vascularity seen within this region.

Right ovary

Not visualized.  No adnexal mass.

Left ovary

Not visualized.  No adnexal mass.

Other findings

No abnormal free fluid.
IMPRESSION: 1. Complex abnormal appearance of the endometrium with 2.5 x 1.4 x
1.8 cm mass-like area at the uterine fundus. In the setting of
post-menopausal bleeding, endometrial sampling is indicated to
exclude carcinoma. If results are benign, sonohysterogram should be
considered for focal lesion work-up prior to hysteroscopy. (Ref:
Radiological Reasoning: Algorithmic Workup of Abnormal Vaginal
Bleeding with Endovaginal Sonography and Sonohysterography. AJR
2554; 191:S68-73).
2. Fibroid uterus as above.
3. Nonvisualization of the ovaries.  No adnexal mass.

## 2020-06-16 IMAGING — RF DG ESOPHAGUS
8 series · 14 of 14 positions shown · non-contrast
Comparison: CT abdomen pelvis 11/04/2017

CLINICAL DATA: Dysphagia.  Nausea vomiting

EXAM:
ESOPHOGRAM/BARIUM SWALLOW
TECHNIQUE: Single contrast examination was performed using  thin barium.
FLUOROSCOPY TIME:  Fluoroscopy Time:  1 minutes 48 seconds
Radiation Exposure Index (if provided by the fluoroscopic device):
Number of Acquired Spot Images: 0

[Series 1: cp_standard · 0.41mm/px · 4 of 159 frames shown (1 of 2)]
[frame 2/159]
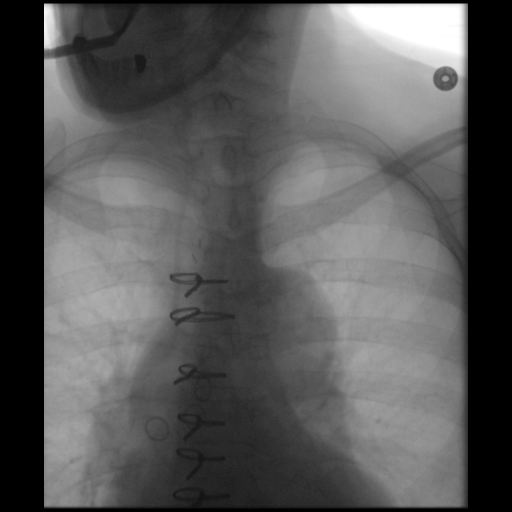
[frame 24/159]
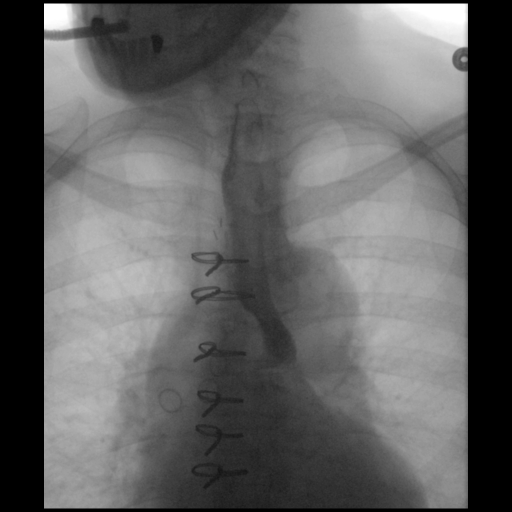
[frame 80/159]
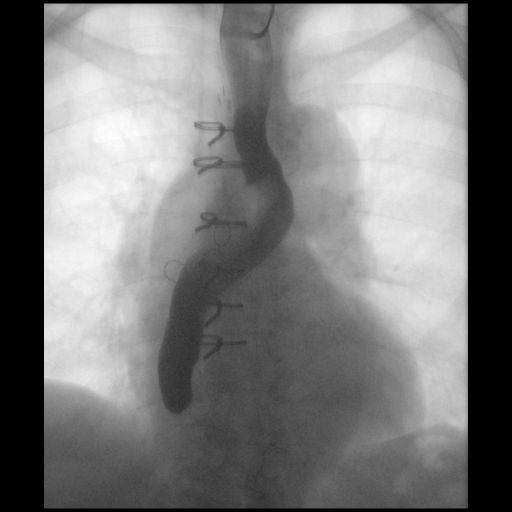
[frame 136/159]
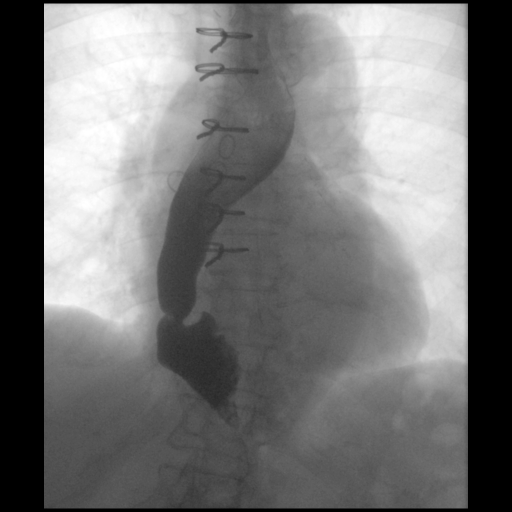

[Series 2: cp_standard · 0.42mm/px · 4 of 130 frames shown (2 of 2)]
[frame 20/130]
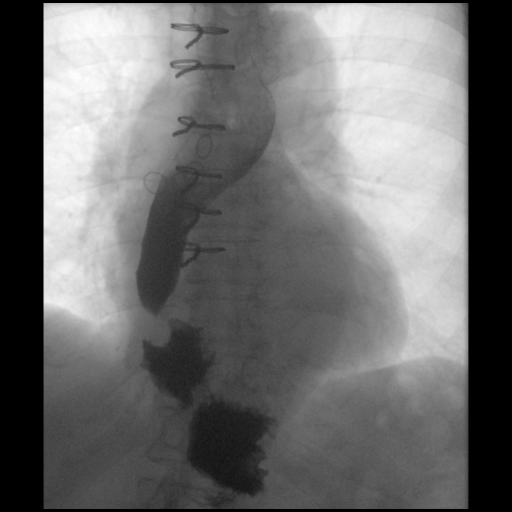
[frame 66/130]
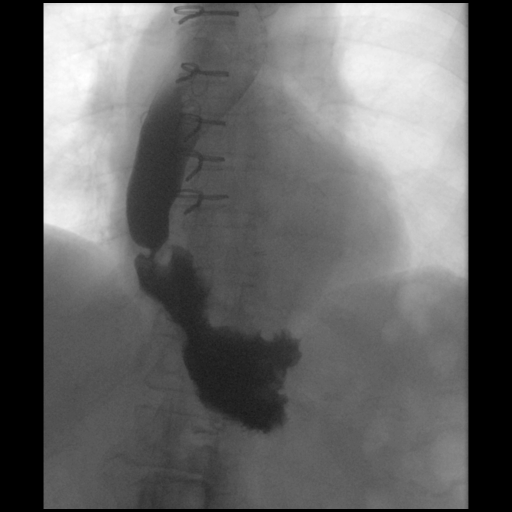
[frame 69/130]
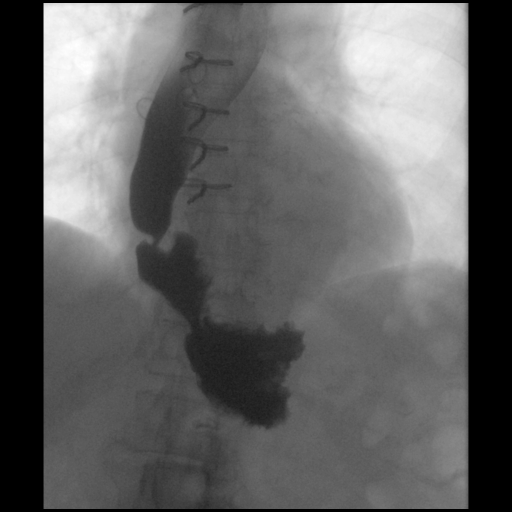
[frame 111/130]
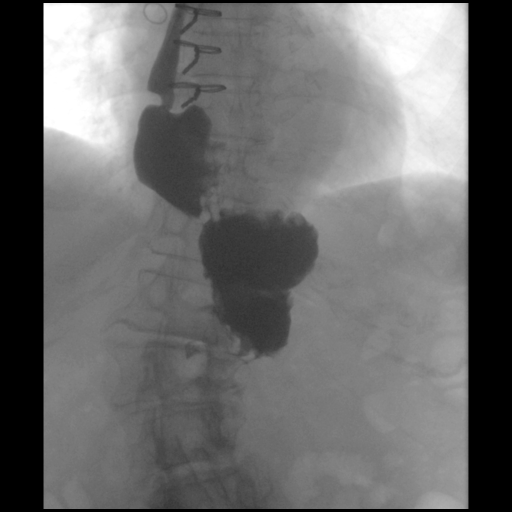

[Series 3: fluoro_barium 2fps_bw · 0.21mm/px · 1 of 1 slices shown (1 of 6)]
[im 1/1]
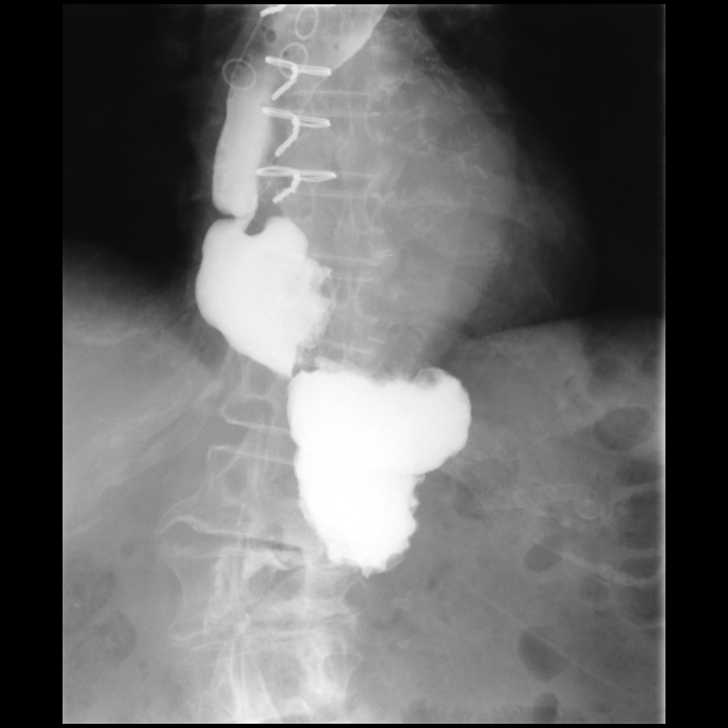

[Series 4: fluoro_barium 2fps_bw · 0.22mm/px · 1 of 1 slices shown (2 of 6)]
[im 1/1]
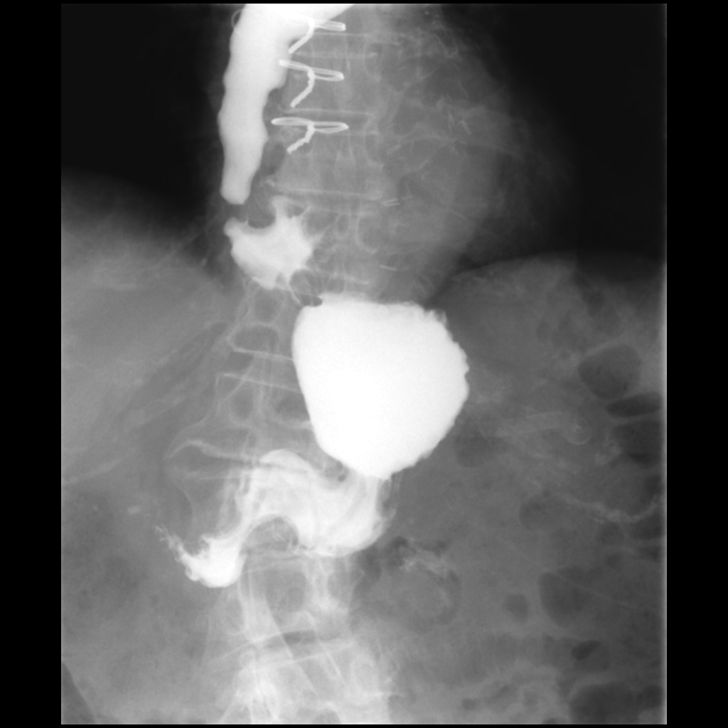

[Series 5: fluoro_barium 2fps_bw · 0.20mm/px · 1 of 1 slices shown (3 of 6)]
[im 1/1]
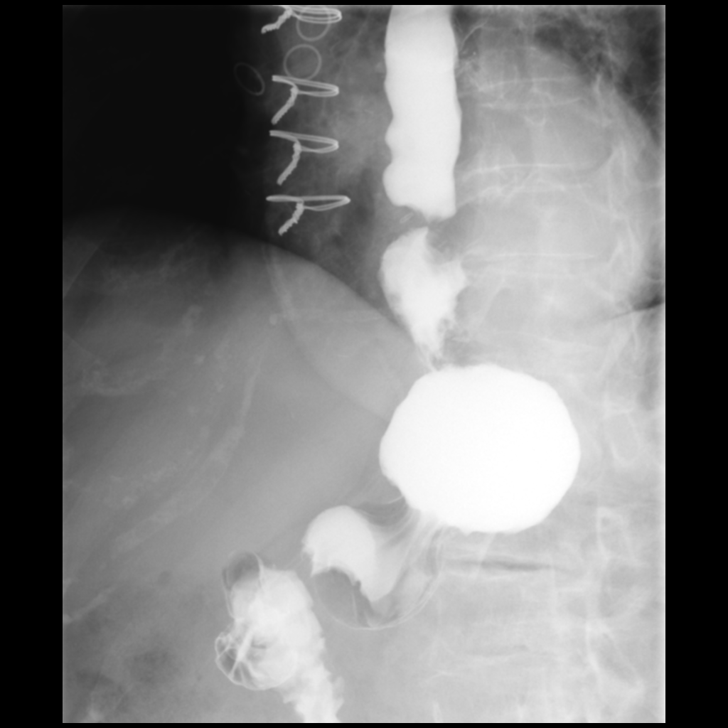

[Series 6: fluoro_barium 2fps_bw · 0.20mm/px · 1 of 1 slices shown (4 of 6)]
[im 1/1]
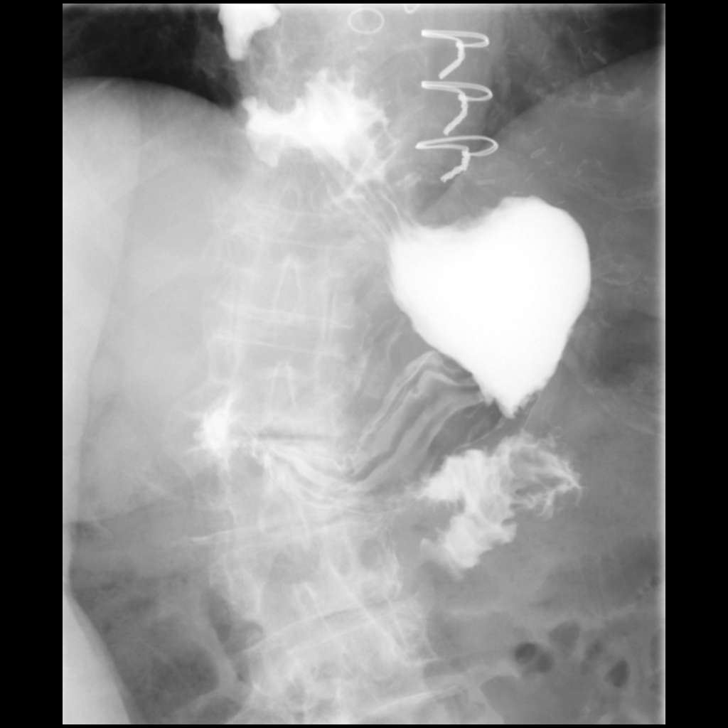

[Series 7: fluoro_barium 2fps_bw · 0.20mm/px · 1 of 1 slices shown (5 of 6)]
[im 1/1]
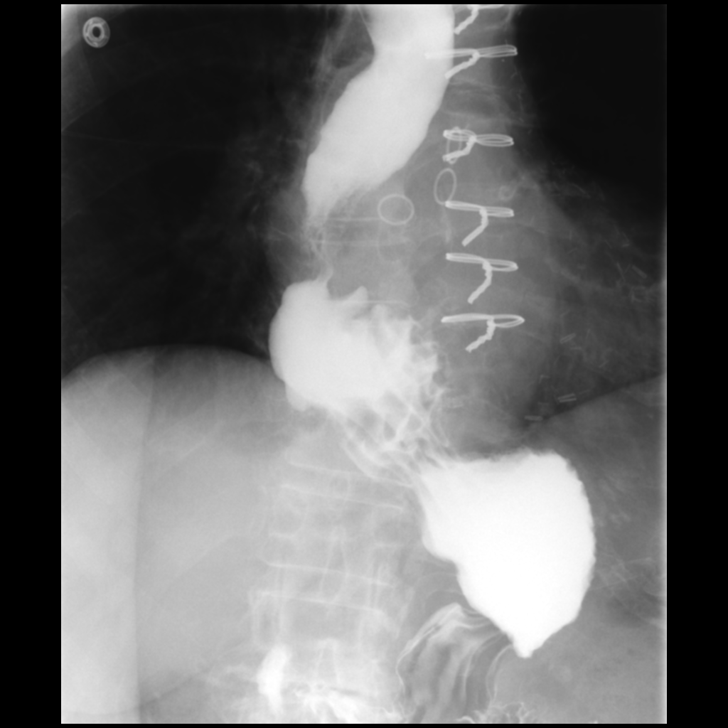

[Series 8: fluoro_barium 2fps_bw · 0.20mm/px · 1 of 1 slices shown (6 of 6)]
[im 1/1]
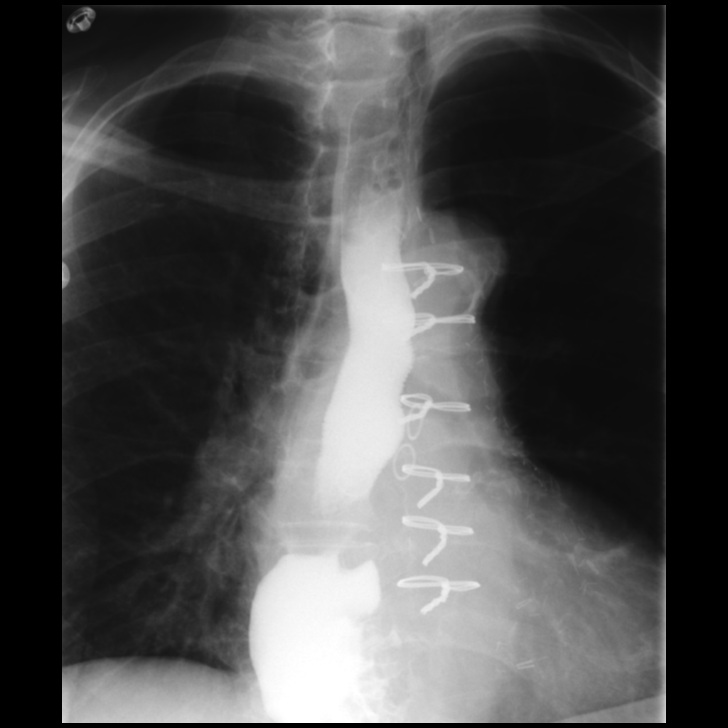

[14 of 14 positions shown; findings below may reference images not displayed]

FINDINGS: Poor esophageal motility. Esophagus diffusely dilated with poor
motility. No stricture or mass. PET

Moderately large hiatal hernia. Sliding hiatal hernia. Grossly
normal stomach.

Barium tablet not administered due to poor motility and retained
barium throughout the esophagus. In addition, the patient was not
able to stand upright.
IMPRESSION: Moderately large hiatal hernia with poor esophageal motility.
Negative for stricture or mass.

## 2020-06-18 IMAGING — CR DG ABDOMEN 1V
1 series · 1 of 1 positions shown · non-contrast
Comparison: 11/07/2017

CLINICAL DATA: Check for retained barium

EXAM:
ABDOMEN - 1 VIEW

[t abdomen supine]
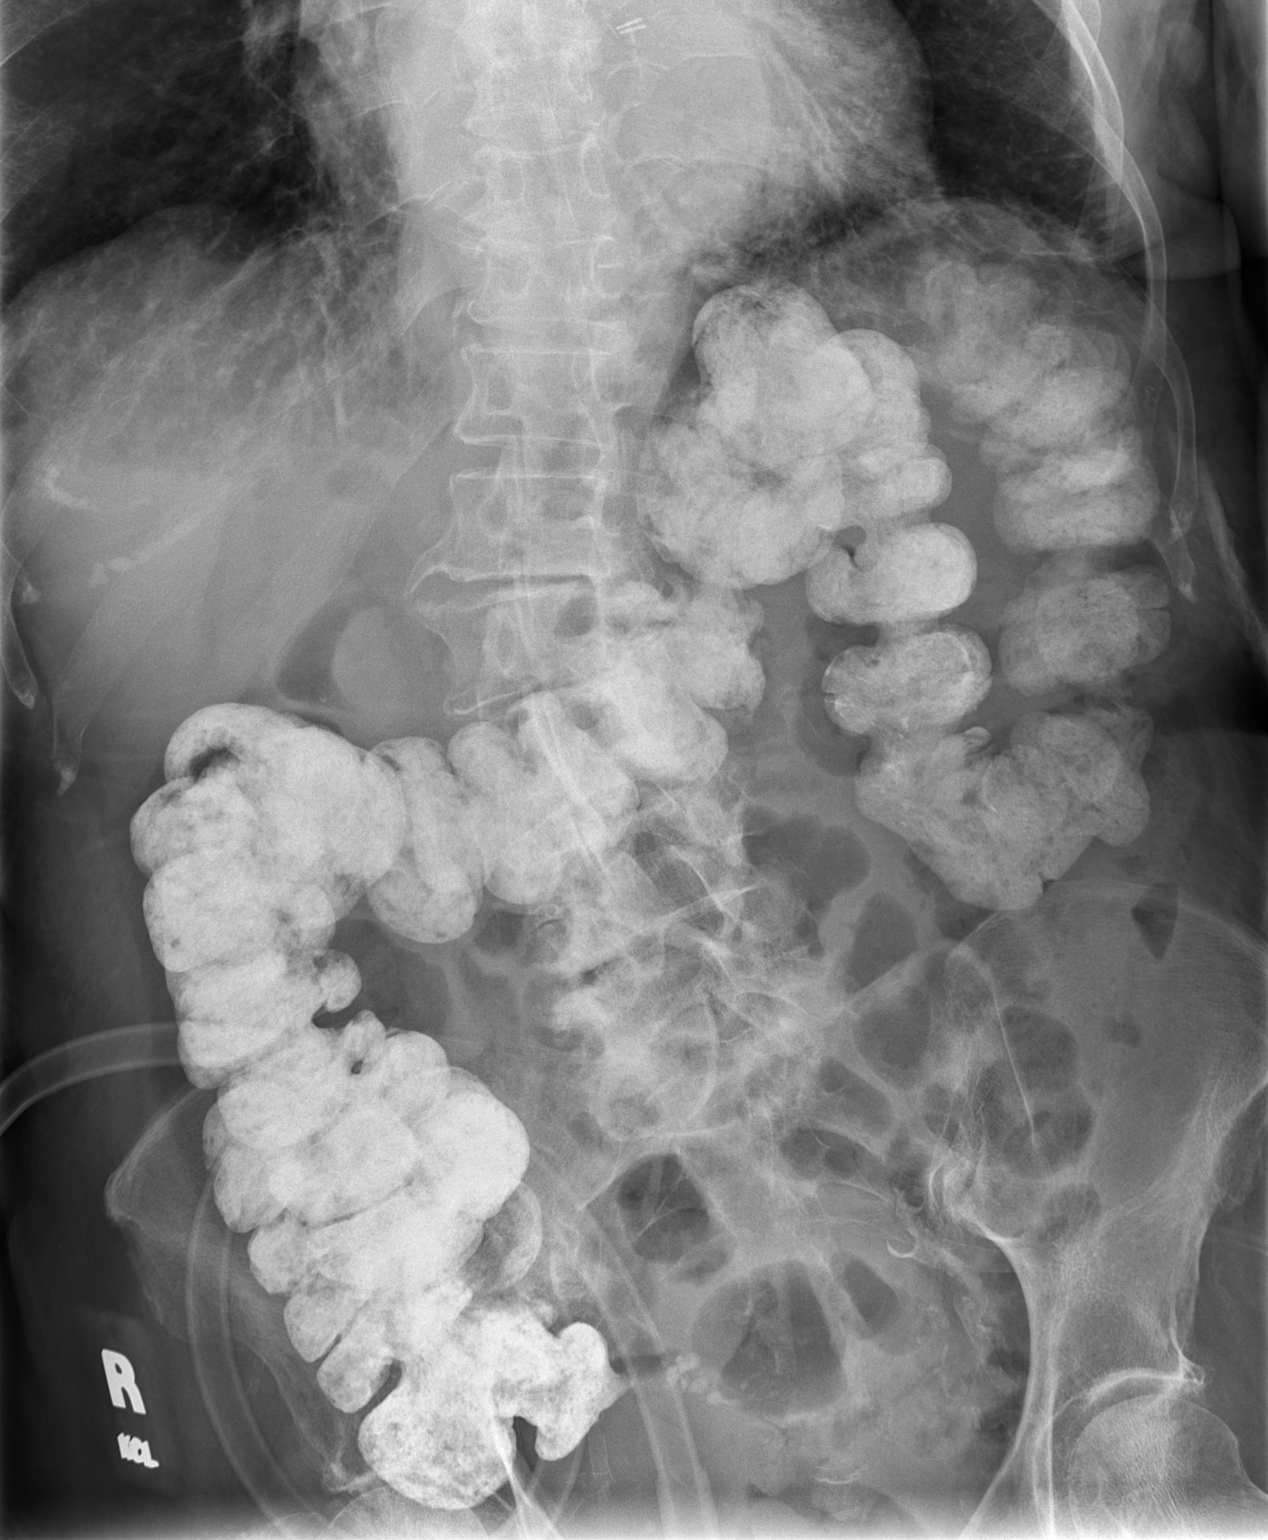

[1 of 1 positions shown; findings below may reference images not displayed]

FINDINGS: Contrast material is again identified throughout the colon. No
obstructive changes are seen. Degenerative changes of lumbar spine
are again noted. No new focal abnormality is seen.
IMPRESSION: Considerable retained contrast within the colon likely precluding
adequate upper GI examination.

## 2020-07-15 IMAGING — DX DG CHEST 1V PORT
1 series · 1 of 1 positions shown · non-contrast
Comparison: Portable chest x-ray of 11/09/2016

CLINICAL DATA: Altered mental status, difficulty with speech

EXAM:
PORTABLE CHEST 1 VIEW

[chest ap]
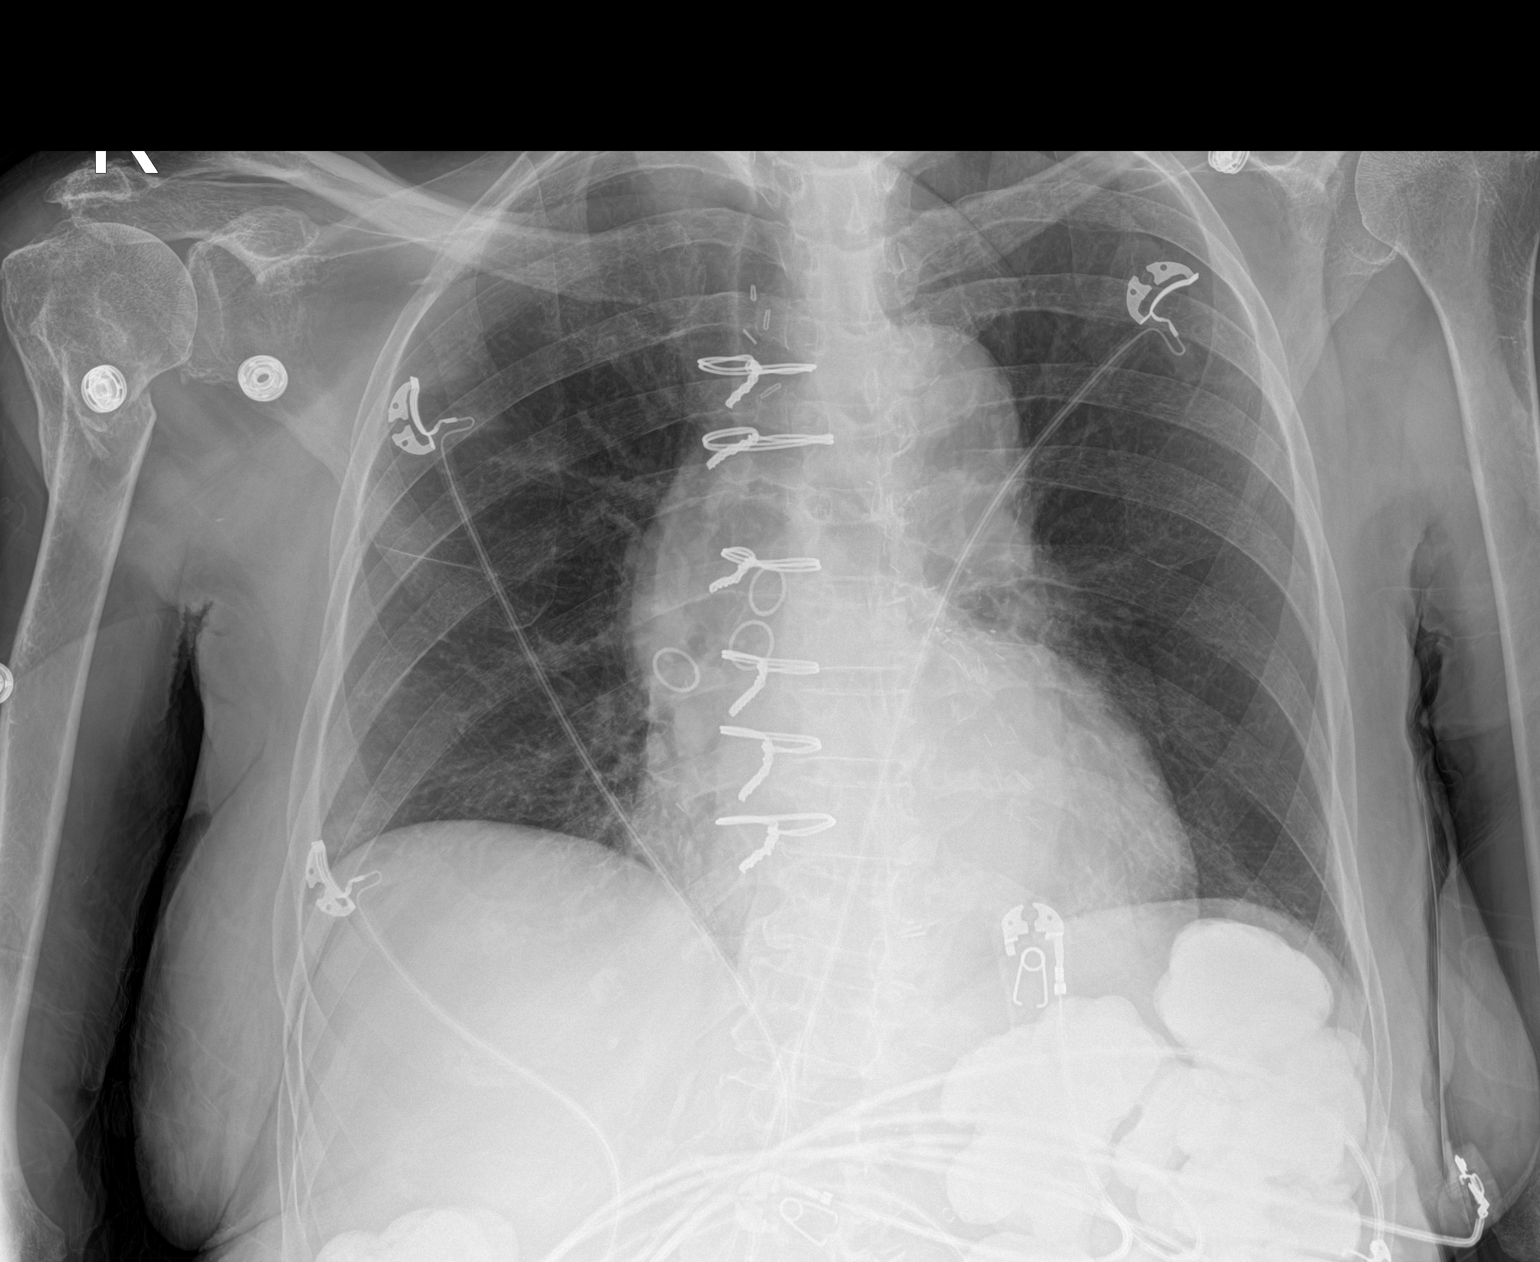

[1 of 1 positions shown; findings below may reference images not displayed]

FINDINGS: No active infiltrate or effusion is seen. Mediastinal and hilar
contours are unremarkable. Mild cardiomegaly is stable as is the
ectatic descending thoracic aorta. Posttraumatic degenerative change
of the right humeral neck is again noted.
IMPRESSION: 1. No active lung disease.
2. Stable mild cardiomegaly.

## 2020-10-10 IMAGING — DX DG CHEST 1V
1 series · 1 of 1 positions shown · non-contrast
Comparison: 12/17/2009.

CLINICAL DATA: Aspiration.  Vomiting.

EXAM:
CHEST  1 VIEW

[chest ap]
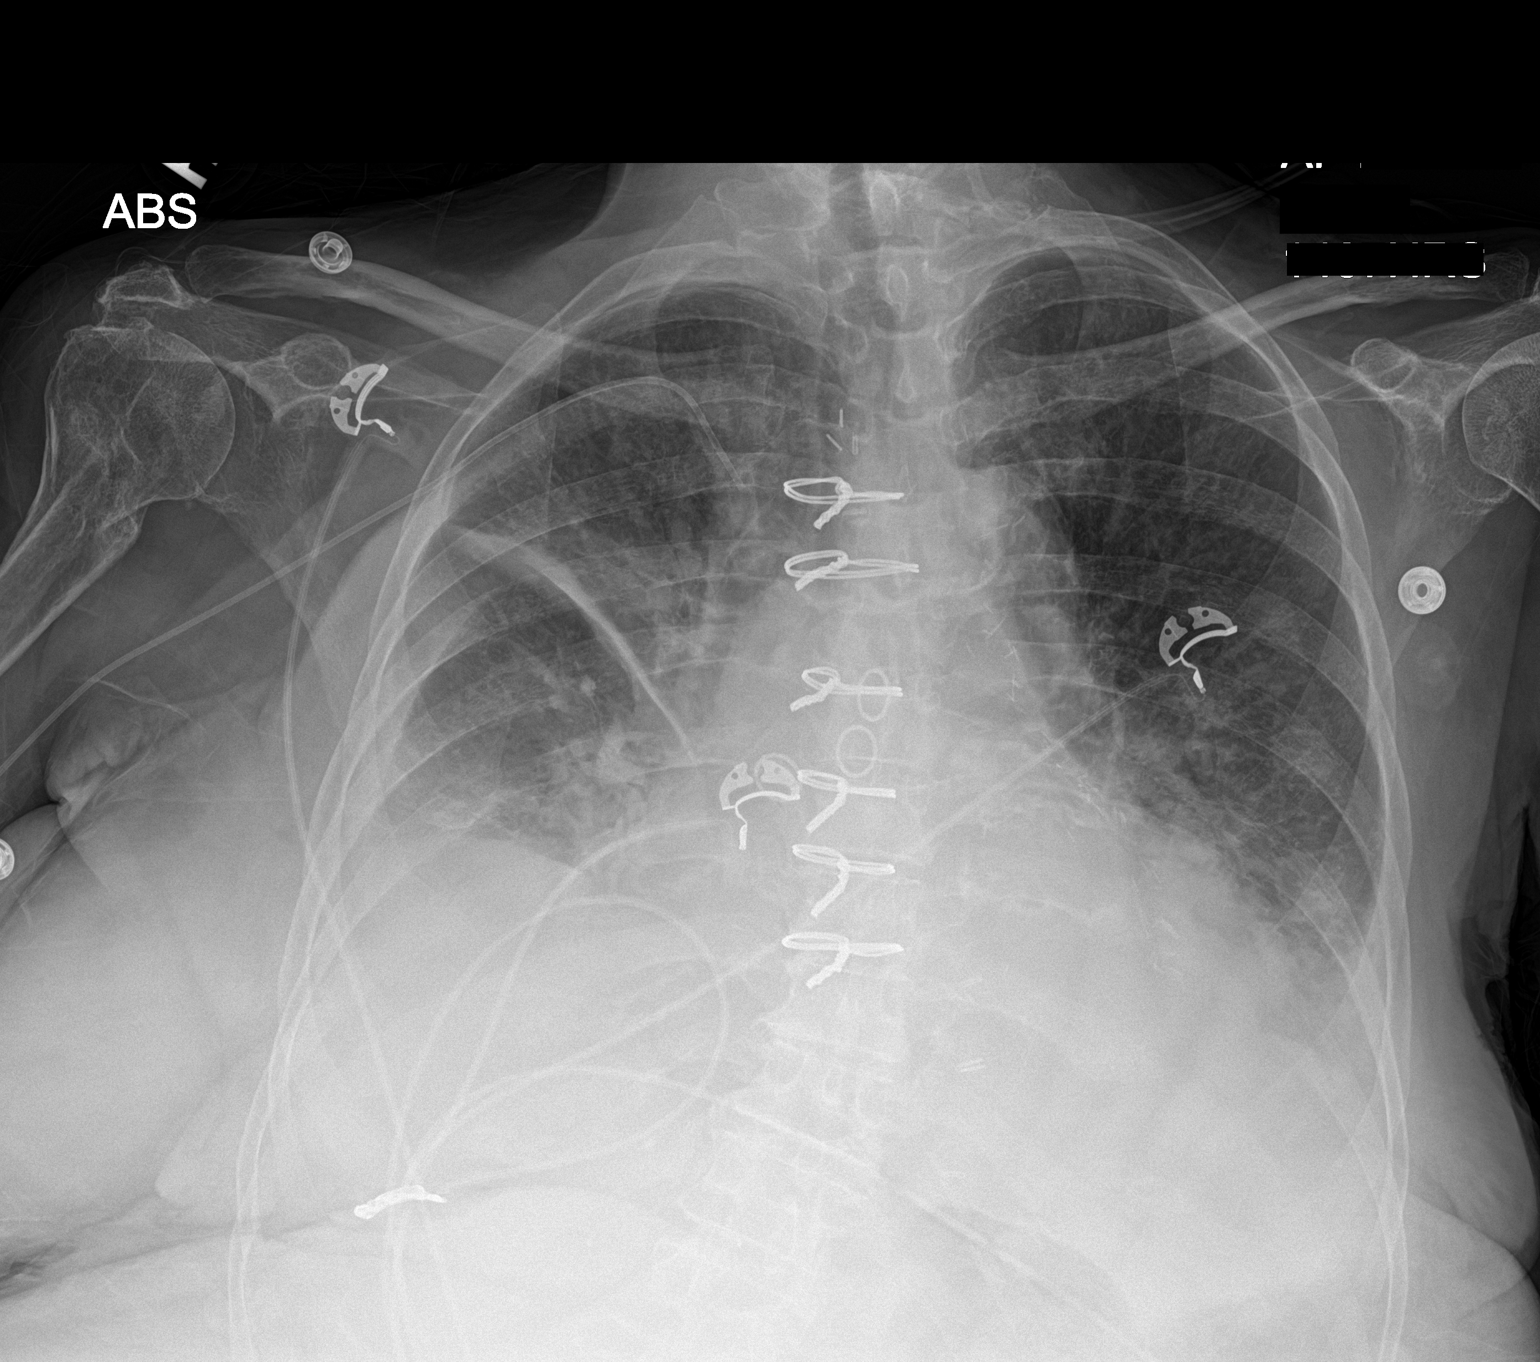

[1 of 1 positions shown; findings below may reference images not displayed]

FINDINGS: Right PICC line noted with tip over upper superior vena cava. Prior
CABG. Cardiomegaly with diffuse bilateral pulmonary infiltrates most
consistent pulmonary edema. Bilateral pleural effusions. Findings
consistent with CHF.
IMPRESSION: 1.  Right PICC line noted with tip over upper superior vena cava.

2. Prior CABG. Congestive heart failure bilateral pulmonary edema
and bilateral pleural effusions. Bilateral pneumonia can not be
excluded.
